# Patient Record
Sex: Female | Born: 1940
Health system: Southern US, Community
[De-identification: ages and names within clinical notes are randomized; demographics above are authoritative.]

## PROBLEM LIST (undated history)

## (undated) DIAGNOSIS — R1011 Right upper quadrant pain: Secondary | ICD-10-CM

## (undated) DIAGNOSIS — E669 Obesity, unspecified: Secondary | ICD-10-CM

## (undated) DIAGNOSIS — I2510 Atherosclerotic heart disease of native coronary artery without angina pectoris: Secondary | ICD-10-CM

## (undated) DIAGNOSIS — J189 Pneumonia, unspecified organism: Secondary | ICD-10-CM

## (undated) DIAGNOSIS — IMO0002 Reserved for concepts with insufficient information to code with codable children: Secondary | ICD-10-CM

## (undated) DIAGNOSIS — I779 Disorder of arteries and arterioles, unspecified: Secondary | ICD-10-CM

## (undated) DIAGNOSIS — Z9889 Other specified postprocedural states: Secondary | ICD-10-CM

## (undated) DIAGNOSIS — I480 Paroxysmal atrial fibrillation: Secondary | ICD-10-CM

## (undated) DIAGNOSIS — I10 Essential (primary) hypertension: Secondary | ICD-10-CM

## (undated) DIAGNOSIS — I4891 Unspecified atrial fibrillation: Secondary | ICD-10-CM

## (undated) DIAGNOSIS — G4730 Sleep apnea, unspecified: Secondary | ICD-10-CM

## (undated) DIAGNOSIS — T4145XA Adverse effect of unspecified anesthetic, initial encounter: Secondary | ICD-10-CM

## (undated) DIAGNOSIS — K219 Gastro-esophageal reflux disease without esophagitis: Secondary | ICD-10-CM

## (undated) DIAGNOSIS — E039 Hypothyroidism, unspecified: Secondary | ICD-10-CM

## (undated) DIAGNOSIS — F32A Depression, unspecified: Secondary | ICD-10-CM

## (undated) DIAGNOSIS — T7840XA Allergy, unspecified, initial encounter: Secondary | ICD-10-CM

## (undated) DIAGNOSIS — M1990 Unspecified osteoarthritis, unspecified site: Secondary | ICD-10-CM

## (undated) DIAGNOSIS — E049 Nontoxic goiter, unspecified: Secondary | ICD-10-CM

## (undated) DIAGNOSIS — R112 Nausea with vomiting, unspecified: Secondary | ICD-10-CM

## (undated) DIAGNOSIS — F419 Anxiety disorder, unspecified: Secondary | ICD-10-CM

## (undated) DIAGNOSIS — R600 Localized edema: Secondary | ICD-10-CM

## (undated) DIAGNOSIS — E785 Hyperlipidemia, unspecified: Secondary | ICD-10-CM

## (undated) DIAGNOSIS — N189 Chronic kidney disease, unspecified: Secondary | ICD-10-CM

## (undated) DIAGNOSIS — R9430 Abnormal result of cardiovascular function study, unspecified: Secondary | ICD-10-CM

## (undated) DIAGNOSIS — I509 Heart failure, unspecified: Secondary | ICD-10-CM

## (undated) DIAGNOSIS — R0600 Dyspnea, unspecified: Secondary | ICD-10-CM

## (undated) DIAGNOSIS — G4733 Obstructive sleep apnea (adult) (pediatric): Secondary | ICD-10-CM

## (undated) DIAGNOSIS — H269 Unspecified cataract: Secondary | ICD-10-CM

## (undated) DIAGNOSIS — R51 Headache: Secondary | ICD-10-CM

## (undated) DIAGNOSIS — J449 Chronic obstructive pulmonary disease, unspecified: Secondary | ICD-10-CM

## (undated) DIAGNOSIS — E119 Type 2 diabetes mellitus without complications: Secondary | ICD-10-CM

## (undated) DIAGNOSIS — I739 Peripheral vascular disease, unspecified: Secondary | ICD-10-CM

## (undated) DIAGNOSIS — K2990 Gastroduodenitis, unspecified, without bleeding: Secondary | ICD-10-CM

## (undated) DIAGNOSIS — R42 Dizziness and giddiness: Secondary | ICD-10-CM

## (undated) DIAGNOSIS — T8859XA Other complications of anesthesia, initial encounter: Secondary | ICD-10-CM

## (undated) DIAGNOSIS — I517 Cardiomegaly: Secondary | ICD-10-CM

## (undated) DIAGNOSIS — K2970 Gastritis, unspecified, without bleeding: Secondary | ICD-10-CM

## (undated) HISTORY — DX: Gastro-esophageal reflux disease without esophagitis: K21.9

## (undated) HISTORY — DX: Headache: R51

## (undated) HISTORY — DX: Essential (primary) hypertension: I10

## (undated) HISTORY — PX: LUMBAR DISC SURGERY: SHX700

## (undated) HISTORY — PX: ABDOMINAL HYSTERECTOMY: SHX81

## (undated) HISTORY — DX: Reserved for concepts with insufficient information to code with codable children: IMO0002

## (undated) HISTORY — DX: Nontoxic goiter, unspecified: E04.9

## (undated) HISTORY — DX: Obesity, unspecified: E66.9

## (undated) HISTORY — DX: Unspecified cataract: H26.9

## (undated) HISTORY — DX: Hypothyroidism, unspecified: E03.9

## (undated) HISTORY — DX: Allergy, unspecified, initial encounter: T78.40XA

## (undated) HISTORY — PX: APPENDECTOMY: SHX54

## (undated) HISTORY — DX: Type 2 diabetes mellitus without complications: E11.9

## (undated) HISTORY — DX: Cardiomegaly: I51.7

## (undated) HISTORY — DX: Hyperlipidemia, unspecified: E78.5

## (undated) HISTORY — DX: Localized edema: R60.0

## (undated) HISTORY — DX: Abnormal result of cardiovascular function study, unspecified: R94.30

## (undated) HISTORY — DX: Chronic obstructive pulmonary disease, unspecified: J44.9

## (undated) HISTORY — DX: Obstructive sleep apnea (adult) (pediatric): G47.33

## (undated) HISTORY — DX: Anxiety disorder, unspecified: F41.9

## (undated) HISTORY — DX: Gastritis, unspecified, without bleeding: K29.70

## (undated) HISTORY — DX: Disorder of arteries and arterioles, unspecified: I77.9

## (undated) HISTORY — PX: JOINT REPLACEMENT: SHX530

## (undated) HISTORY — PX: CATARACT EXTRACTION W/ INTRAOCULAR LENS  IMPLANT, BILATERAL: SHX1307

## (undated) HISTORY — DX: Atherosclerotic heart disease of native coronary artery without angina pectoris: I25.10

## (undated) HISTORY — DX: Right upper quadrant pain: R10.11

## (undated) HISTORY — PX: CARDIAC CATHETERIZATION: SHX172

## (undated) HISTORY — DX: Peripheral vascular disease, unspecified: I73.9

## (undated) HISTORY — PX: BACK SURGERY: SHX140

---
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
## 1997-10-15 ENCOUNTER — Ambulatory Visit (HOSPITAL_COMMUNITY): Admission: RE | Admit: 1997-10-15 | Discharge: 1997-10-15 | Payer: Self-pay | Admitting: Neurosurgery

## 1998-05-01 ENCOUNTER — Encounter: Payer: Self-pay | Admitting: Neurosurgery

## 1998-05-01 ENCOUNTER — Ambulatory Visit (HOSPITAL_COMMUNITY): Admission: RE | Admit: 1998-05-01 | Discharge: 1998-05-01 | Payer: Self-pay | Admitting: Neurosurgery

## 1998-10-04 ENCOUNTER — Ambulatory Visit (HOSPITAL_COMMUNITY): Admission: RE | Admit: 1998-10-04 | Discharge: 1998-10-04 | Payer: Self-pay | Admitting: Neurosurgery

## 1998-10-04 ENCOUNTER — Encounter: Payer: Self-pay | Admitting: Neurosurgery

## 2001-04-19 ENCOUNTER — Ambulatory Visit (HOSPITAL_COMMUNITY): Admission: RE | Admit: 2001-04-19 | Discharge: 2001-04-19 | Payer: Self-pay | Admitting: Neurosurgery

## 2001-04-19 ENCOUNTER — Encounter: Payer: Self-pay | Admitting: Neurosurgery

## 2001-06-14 ENCOUNTER — Encounter: Payer: Self-pay | Admitting: Neurosurgery

## 2001-06-14 ENCOUNTER — Encounter: Admission: RE | Admit: 2001-06-14 | Discharge: 2001-06-14 | Payer: Self-pay | Admitting: Neurosurgery

## 2001-06-29 ENCOUNTER — Encounter: Admission: RE | Admit: 2001-06-29 | Discharge: 2001-06-29 | Payer: Self-pay | Admitting: Neurosurgery

## 2001-06-29 ENCOUNTER — Encounter: Payer: Self-pay | Admitting: Neurosurgery

## 2001-09-06 ENCOUNTER — Ambulatory Visit (HOSPITAL_COMMUNITY): Admission: RE | Admit: 2001-09-06 | Discharge: 2001-09-06 | Payer: Self-pay | Admitting: Neurosurgery

## 2001-09-06 ENCOUNTER — Encounter: Payer: Self-pay | Admitting: Neurosurgery

## 2002-04-23 ENCOUNTER — Encounter: Payer: Self-pay | Admitting: Neurosurgery

## 2002-04-23 ENCOUNTER — Ambulatory Visit (HOSPITAL_COMMUNITY): Admission: RE | Admit: 2002-04-23 | Discharge: 2002-04-23 | Payer: Self-pay | Admitting: Neurosurgery

## 2003-10-29 ENCOUNTER — Encounter: Admission: RE | Admit: 2003-10-29 | Discharge: 2003-10-29 | Payer: Self-pay | Admitting: Family Medicine

## 2004-02-15 ENCOUNTER — Encounter: Admission: RE | Admit: 2004-02-15 | Discharge: 2004-02-15 | Payer: Self-pay | Admitting: Family Medicine

## 2004-09-11 ENCOUNTER — Ambulatory Visit (HOSPITAL_COMMUNITY): Admission: RE | Admit: 2004-09-11 | Discharge: 2004-09-11 | Payer: Self-pay | Admitting: Neurosurgery

## 2004-10-22 ENCOUNTER — Ambulatory Visit (HOSPITAL_COMMUNITY): Admission: RE | Admit: 2004-10-22 | Discharge: 2004-10-22 | Payer: Self-pay | Admitting: Neurosurgery

## 2005-01-08 ENCOUNTER — Ambulatory Visit: Payer: Self-pay | Admitting: Cardiology

## 2005-01-08 ENCOUNTER — Inpatient Hospital Stay (HOSPITAL_COMMUNITY): Admission: EM | Admit: 2005-01-08 | Discharge: 2005-01-12 | Payer: Self-pay | Admitting: Emergency Medicine

## 2005-01-21 ENCOUNTER — Ambulatory Visit: Payer: Self-pay | Admitting: Pulmonary Disease

## 2005-01-28 ENCOUNTER — Ambulatory Visit: Payer: Self-pay

## 2005-01-28 ENCOUNTER — Ambulatory Visit: Payer: Self-pay | Admitting: Physician Assistant

## 2005-02-08 ENCOUNTER — Ambulatory Visit (HOSPITAL_BASED_OUTPATIENT_CLINIC_OR_DEPARTMENT_OTHER): Admission: RE | Admit: 2005-02-08 | Discharge: 2005-02-08 | Payer: Self-pay | Admitting: Pulmonary Disease

## 2005-02-11 ENCOUNTER — Ambulatory Visit: Payer: Self-pay | Admitting: Pulmonary Disease

## 2005-02-12 ENCOUNTER — Ambulatory Visit: Payer: Self-pay | Admitting: Cardiology

## 2005-02-12 ENCOUNTER — Ambulatory Visit: Payer: Self-pay | Admitting: Pulmonary Disease

## 2005-02-18 ENCOUNTER — Ambulatory Visit: Payer: Self-pay | Admitting: Pulmonary Disease

## 2005-03-08 ENCOUNTER — Ambulatory Visit (HOSPITAL_BASED_OUTPATIENT_CLINIC_OR_DEPARTMENT_OTHER): Admission: RE | Admit: 2005-03-08 | Discharge: 2005-03-08 | Payer: Self-pay | Admitting: Pulmonary Disease

## 2005-03-24 ENCOUNTER — Encounter: Admission: RE | Admit: 2005-03-24 | Discharge: 2005-03-24 | Payer: Self-pay | Admitting: Family Medicine

## 2005-03-25 ENCOUNTER — Ambulatory Visit: Payer: Self-pay | Admitting: Pulmonary Disease

## 2005-05-13 ENCOUNTER — Ambulatory Visit: Payer: Self-pay | Admitting: Pulmonary Disease

## 2005-07-01 ENCOUNTER — Ambulatory Visit: Payer: Self-pay | Admitting: Pulmonary Disease

## 2005-07-14 ENCOUNTER — Ambulatory Visit: Payer: Self-pay | Admitting: Pulmonary Disease

## 2005-10-28 ENCOUNTER — Ambulatory Visit: Payer: Self-pay | Admitting: Pulmonary Disease

## 2005-11-18 ENCOUNTER — Ambulatory Visit: Payer: Self-pay | Admitting: Pulmonary Disease

## 2005-12-24 ENCOUNTER — Ambulatory Visit: Payer: Self-pay | Admitting: Emergency Medicine

## 2006-01-11 ENCOUNTER — Ambulatory Visit: Payer: Self-pay | Admitting: Pulmonary Disease

## 2006-03-10 ENCOUNTER — Ambulatory Visit: Payer: Self-pay | Admitting: Pulmonary Disease

## 2006-04-21 ENCOUNTER — Encounter: Admission: RE | Admit: 2006-04-21 | Discharge: 2006-04-21 | Payer: Self-pay | Admitting: Family Medicine

## 2006-06-04 ENCOUNTER — Encounter: Admission: RE | Admit: 2006-06-04 | Discharge: 2006-06-04 | Payer: Self-pay | Admitting: Neurosurgery

## 2006-07-07 ENCOUNTER — Ambulatory Visit: Payer: Self-pay | Admitting: Pulmonary Disease

## 2006-10-05 ENCOUNTER — Ambulatory Visit: Payer: Self-pay | Admitting: Pulmonary Disease

## 2006-10-18 ENCOUNTER — Encounter: Payer: Self-pay | Admitting: Pulmonary Disease

## 2006-10-18 ENCOUNTER — Ambulatory Visit: Payer: Self-pay

## 2006-11-12 ENCOUNTER — Ambulatory Visit: Payer: Self-pay | Admitting: Pulmonary Disease

## 2007-01-07 ENCOUNTER — Encounter: Admission: RE | Admit: 2007-01-07 | Discharge: 2007-01-07 | Payer: Self-pay | Admitting: Gastroenterology

## 2007-02-16 DIAGNOSIS — J4489 Other specified chronic obstructive pulmonary disease: Secondary | ICD-10-CM

## 2007-02-16 DIAGNOSIS — G4733 Obstructive sleep apnea (adult) (pediatric): Secondary | ICD-10-CM | POA: Insufficient documentation

## 2007-02-16 DIAGNOSIS — I152 Hypertension secondary to endocrine disorders: Secondary | ICD-10-CM | POA: Insufficient documentation

## 2007-02-16 DIAGNOSIS — I10 Essential (primary) hypertension: Secondary | ICD-10-CM | POA: Insufficient documentation

## 2007-02-16 DIAGNOSIS — J449 Chronic obstructive pulmonary disease, unspecified: Secondary | ICD-10-CM

## 2007-02-16 HISTORY — DX: Essential (primary) hypertension: I10

## 2007-02-16 HISTORY — DX: Chronic obstructive pulmonary disease, unspecified: J44.9

## 2007-02-16 HISTORY — DX: Obstructive sleep apnea (adult) (pediatric): G47.33

## 2007-02-16 HISTORY — DX: Other specified chronic obstructive pulmonary disease: J44.89

## 2007-03-23 ENCOUNTER — Ambulatory Visit: Payer: Self-pay | Admitting: Pulmonary Disease

## 2007-03-23 DIAGNOSIS — E119 Type 2 diabetes mellitus without complications: Secondary | ICD-10-CM | POA: Insufficient documentation

## 2007-03-23 DIAGNOSIS — M159 Polyosteoarthritis, unspecified: Secondary | ICD-10-CM | POA: Insufficient documentation

## 2007-03-23 DIAGNOSIS — K219 Gastro-esophageal reflux disease without esophagitis: Secondary | ICD-10-CM | POA: Insufficient documentation

## 2007-03-23 DIAGNOSIS — I2789 Other specified pulmonary heart diseases: Secondary | ICD-10-CM | POA: Insufficient documentation

## 2007-03-23 DIAGNOSIS — K5730 Diverticulosis of large intestine without perforation or abscess without bleeding: Secondary | ICD-10-CM

## 2007-03-23 HISTORY — DX: Gastro-esophageal reflux disease without esophagitis: K21.9

## 2007-03-23 HISTORY — DX: Diverticulosis of large intestine without perforation or abscess without bleeding: K57.30

## 2007-03-23 HISTORY — DX: Type 2 diabetes mellitus without complications: E11.9

## 2007-03-24 ENCOUNTER — Ambulatory Visit: Payer: Self-pay | Admitting: Cardiology

## 2007-03-25 ENCOUNTER — Ambulatory Visit: Payer: Self-pay

## 2007-06-04 ENCOUNTER — Encounter: Admission: RE | Admit: 2007-06-04 | Discharge: 2007-06-04 | Payer: Self-pay | Admitting: Neurosurgery

## 2007-06-20 ENCOUNTER — Telehealth (INDEPENDENT_AMBULATORY_CARE_PROVIDER_SITE_OTHER): Payer: Self-pay | Admitting: *Deleted

## 2007-11-08 ENCOUNTER — Ambulatory Visit: Payer: Self-pay | Admitting: Pulmonary Disease

## 2007-12-01 ENCOUNTER — Encounter: Payer: Self-pay | Admitting: Pulmonary Disease

## 2008-01-05 ENCOUNTER — Encounter (HOSPITAL_COMMUNITY): Admission: RE | Admit: 2008-01-05 | Discharge: 2008-01-19 | Payer: Self-pay | Admitting: Pulmonary Disease

## 2008-01-05 ENCOUNTER — Encounter: Payer: Self-pay | Admitting: Pulmonary Disease

## 2008-01-06 ENCOUNTER — Telehealth: Payer: Self-pay | Admitting: Pulmonary Disease

## 2008-01-18 ENCOUNTER — Encounter: Admission: RE | Admit: 2008-01-18 | Discharge: 2008-01-18 | Payer: Self-pay | Admitting: Family Medicine

## 2008-03-14 ENCOUNTER — Encounter: Admission: RE | Admit: 2008-03-14 | Discharge: 2008-03-14 | Payer: Self-pay | Admitting: Family Medicine

## 2008-03-24 ENCOUNTER — Encounter: Admission: RE | Admit: 2008-03-24 | Discharge: 2008-03-24 | Payer: Self-pay | Admitting: General Surgery

## 2008-03-26 ENCOUNTER — Ambulatory Visit: Payer: Self-pay | Admitting: Cardiology

## 2008-03-26 ENCOUNTER — Ambulatory Visit: Payer: Self-pay

## 2008-03-28 ENCOUNTER — Other Ambulatory Visit: Admission: RE | Admit: 2008-03-28 | Discharge: 2008-03-28 | Payer: Self-pay | Admitting: Interventional Radiology

## 2008-03-28 ENCOUNTER — Encounter (INDEPENDENT_AMBULATORY_CARE_PROVIDER_SITE_OTHER): Payer: Self-pay | Admitting: Interventional Radiology

## 2008-03-28 ENCOUNTER — Encounter: Admission: RE | Admit: 2008-03-28 | Discharge: 2008-03-28 | Payer: Self-pay | Admitting: General Surgery

## 2008-04-17 ENCOUNTER — Encounter (INDEPENDENT_AMBULATORY_CARE_PROVIDER_SITE_OTHER): Payer: Self-pay | Admitting: General Surgery

## 2008-04-17 ENCOUNTER — Ambulatory Visit (HOSPITAL_COMMUNITY): Admission: RE | Admit: 2008-04-17 | Discharge: 2008-04-18 | Payer: Self-pay | Admitting: General Surgery

## 2008-04-24 ENCOUNTER — Encounter: Payer: Self-pay | Admitting: Endocrinology

## 2008-05-07 ENCOUNTER — Encounter: Payer: Self-pay | Admitting: Endocrinology

## 2008-05-31 ENCOUNTER — Encounter: Admission: RE | Admit: 2008-05-31 | Discharge: 2008-05-31 | Payer: Self-pay | Admitting: Family Medicine

## 2008-06-04 ENCOUNTER — Ambulatory Visit: Payer: Self-pay | Admitting: Endocrinology

## 2008-06-04 DIAGNOSIS — R51 Headache: Secondary | ICD-10-CM

## 2008-06-04 DIAGNOSIS — E890 Postprocedural hypothyroidism: Secondary | ICD-10-CM | POA: Insufficient documentation

## 2008-06-04 DIAGNOSIS — R519 Headache, unspecified: Secondary | ICD-10-CM | POA: Insufficient documentation

## 2008-06-04 HISTORY — DX: Postprocedural hypothyroidism: E89.0

## 2008-06-04 LAB — CONVERTED CEMR LAB
BUN: 32 mg/dL — ABNORMAL HIGH (ref 6–23)
CO2: 27 meq/L (ref 19–32)
Calcium: 10.2 mg/dL (ref 8.4–10.5)
Chloride: 98 meq/L (ref 96–112)
Creatinine, Ser: 1.4 mg/dL — ABNORMAL HIGH (ref 0.4–1.2)
GFR calc Af Amer: 48 mL/min
GFR calc non Af Amer: 40 mL/min
Glucose, Bld: 78 mg/dL (ref 70–99)
Potassium: 4.3 meq/L (ref 3.5–5.1)
Sed Rate: 31 mm/hr — ABNORMAL HIGH (ref 0–22)
Sodium: 138 meq/L (ref 135–145)
TSH: 48.02 microintl units/mL — ABNORMAL HIGH (ref 0.35–5.50)

## 2008-06-07 LAB — CONVERTED CEMR LAB
Calcium, Total (PTH): 9.9 mg/dL (ref 8.4–10.5)
PTH: 19.5 pg/mL (ref 14.0–72.0)

## 2008-06-20 ENCOUNTER — Ambulatory Visit: Payer: Self-pay | Admitting: Pulmonary Disease

## 2008-10-19 ENCOUNTER — Encounter: Payer: Self-pay | Admitting: Pulmonary Disease

## 2008-10-19 ENCOUNTER — Ambulatory Visit: Payer: Self-pay | Admitting: Pulmonary Disease

## 2008-11-12 ENCOUNTER — Ambulatory Visit: Payer: Self-pay | Admitting: Pulmonary Disease

## 2008-11-12 ENCOUNTER — Ambulatory Visit: Payer: Self-pay | Admitting: Adult Health

## 2008-11-12 LAB — CONVERTED CEMR LAB
BUN: 22 mg/dL (ref 6–23)
CO2: 28 meq/L (ref 19–32)
Calcium: 9.2 mg/dL (ref 8.4–10.5)
Chloride: 105 meq/L (ref 96–112)
Creatinine, Ser: 1 mg/dL (ref 0.4–1.2)
GFR calc non Af Amer: 58.67 mL/min (ref >60)
Glucose, Bld: 132 mg/dL — ABNORMAL HIGH (ref 70–99)
Potassium: 4.1 meq/L (ref 3.5–5.1)
Pro B Natriuretic peptide (BNP): 110 pg/mL — ABNORMAL HIGH (ref 0.0–100.0)
Sodium: 146 meq/L — ABNORMAL HIGH (ref 135–145)

## 2008-11-24 DIAGNOSIS — I517 Cardiomegaly: Secondary | ICD-10-CM | POA: Insufficient documentation

## 2008-11-24 DIAGNOSIS — E669 Obesity, unspecified: Secondary | ICD-10-CM | POA: Insufficient documentation

## 2008-11-24 DIAGNOSIS — E785 Hyperlipidemia, unspecified: Secondary | ICD-10-CM | POA: Insufficient documentation

## 2008-11-24 DIAGNOSIS — E1169 Type 2 diabetes mellitus with other specified complication: Secondary | ICD-10-CM | POA: Insufficient documentation

## 2008-11-24 DIAGNOSIS — I2510 Atherosclerotic heart disease of native coronary artery without angina pectoris: Secondary | ICD-10-CM | POA: Insufficient documentation

## 2008-11-24 HISTORY — DX: Atherosclerotic heart disease of native coronary artery without angina pectoris: I25.10

## 2008-11-24 HISTORY — DX: Cardiomegaly: I51.7

## 2008-11-24 HISTORY — DX: Obesity, unspecified: E66.9

## 2008-11-28 ENCOUNTER — Ambulatory Visit: Payer: Self-pay | Admitting: Cardiology

## 2008-11-28 ENCOUNTER — Encounter: Payer: Self-pay | Admitting: Physician Assistant

## 2008-11-29 ENCOUNTER — Telehealth (INDEPENDENT_AMBULATORY_CARE_PROVIDER_SITE_OTHER): Payer: Self-pay

## 2008-12-03 ENCOUNTER — Ambulatory Visit: Payer: Self-pay

## 2008-12-04 ENCOUNTER — Ambulatory Visit: Payer: Self-pay

## 2008-12-04 ENCOUNTER — Encounter: Payer: Self-pay | Admitting: Cardiovascular Disease

## 2009-01-01 ENCOUNTER — Encounter: Payer: Self-pay | Admitting: Cardiology

## 2009-01-02 ENCOUNTER — Ambulatory Visit: Payer: Self-pay

## 2009-01-02 ENCOUNTER — Ambulatory Visit: Payer: Self-pay | Admitting: Cardiology

## 2009-01-02 ENCOUNTER — Encounter: Payer: Self-pay | Admitting: Cardiology

## 2009-01-08 LAB — CONVERTED CEMR LAB
BUN: 29 mg/dL — ABNORMAL HIGH (ref 6–23)
CO2: 29 meq/L (ref 19–32)
Calcium: 9.6 mg/dL (ref 8.4–10.5)
Chloride: 105 meq/L (ref 96–112)
Creatinine, Ser: 1.2 mg/dL (ref 0.4–1.2)
GFR calc non Af Amer: 47.52 mL/min (ref >60)
Glucose, Bld: 68 mg/dL — ABNORMAL LOW (ref 70–99)
Potassium: 4 meq/L (ref 3.5–5.1)
Sodium: 142 meq/L (ref 135–145)

## 2009-02-07 ENCOUNTER — Encounter: Admission: RE | Admit: 2009-02-07 | Discharge: 2009-02-07 | Payer: Self-pay | Admitting: Neurosurgery

## 2009-02-19 ENCOUNTER — Encounter: Admission: RE | Admit: 2009-02-19 | Discharge: 2009-02-19 | Payer: Self-pay | Admitting: Neurosurgery

## 2009-02-28 ENCOUNTER — Ambulatory Visit: Payer: Self-pay | Admitting: Pulmonary Disease

## 2009-04-02 ENCOUNTER — Ambulatory Visit: Payer: Self-pay | Admitting: Cardiology

## 2009-05-04 HISTORY — PX: THYROIDECTOMY: SHX17

## 2009-05-29 ENCOUNTER — Ambulatory Visit: Payer: Self-pay | Admitting: Pulmonary Disease

## 2009-05-31 ENCOUNTER — Encounter (INDEPENDENT_AMBULATORY_CARE_PROVIDER_SITE_OTHER): Payer: Self-pay | Admitting: *Deleted

## 2009-06-12 ENCOUNTER — Encounter: Payer: Self-pay | Admitting: Pulmonary Disease

## 2009-06-21 ENCOUNTER — Encounter: Payer: Self-pay | Admitting: Pulmonary Disease

## 2009-07-17 ENCOUNTER — Encounter: Payer: Self-pay | Admitting: Cardiology

## 2009-07-18 ENCOUNTER — Ambulatory Visit: Payer: Self-pay | Admitting: Cardiology

## 2009-09-03 ENCOUNTER — Ambulatory Visit: Payer: Self-pay | Admitting: Pulmonary Disease

## 2009-10-03 ENCOUNTER — Encounter: Payer: Self-pay | Admitting: Endocrinology

## 2009-10-03 ENCOUNTER — Encounter: Payer: Self-pay | Admitting: Pulmonary Disease

## 2010-03-19 ENCOUNTER — Encounter: Admission: RE | Admit: 2010-03-19 | Discharge: 2010-03-19 | Payer: Self-pay | Admitting: Family Medicine

## 2010-03-21 ENCOUNTER — Ambulatory Visit: Payer: Self-pay | Admitting: Pulmonary Disease

## 2010-03-25 ENCOUNTER — Encounter: Payer: Self-pay | Admitting: Pulmonary Disease

## 2010-04-08 ENCOUNTER — Encounter: Payer: Self-pay | Admitting: Pulmonary Disease

## 2010-05-25 ENCOUNTER — Encounter: Payer: Self-pay | Admitting: Family Medicine

## 2010-06-05 NOTE — Letter (Signed)
Summary: Appointment - Reminder [place], [place]  [address] [place]   [place],  [place] [address]   Phone: [phone]  Fax: [phone]     May 31, 2009 MRN: [id number]   Kayla [name] [place], [place]  [address]   Dear [name],  Our records indicate that it is time to schedule a follow-up appointment with Dr. Ron Parker. It is very important that we reach you to schedule this appointment. We look forward to participating in your health care needs. Please contact us at the number listed above at your earliest convenience to schedule your appointment.  If you are unable to make an appointment at this time, give [place] a call so we can update our records.  Sincerely,   [facility] Scheduling Team

## 2010-06-05 NOTE — Letter (Signed)
Summary: [facility]  [facility]   Imported By: Bubba Hales 10/11/2009 07:53:28  _____________________________________________________________________  External Attachment:    Type:   Image     Comment:   External Document

## 2010-06-05 NOTE — Miscellaneous (Signed)
  Clinical Lists Changes  Observations: Added new observation of PAST MED HX: EF 55-65%  .. echo 2008  /   60%... echo... September, 2010 Focal basal septal hypertrophy but no LVOT obstruction Lipomatous hypertrophy of the intra-atrial septum CAD (ICD-414.00)- Minimal..cardiac catheter 2006 VENTRICULAR HYPERTROPHY, LEFT (ICD-429.3) HYPERTENSION (ICD-401.9) CAROTID ARTERY DISEASE (ICD-433.10)..Doppler November, 2009.. zero [id number] RICA, [id number] LICA.  Follow up one year HYPERLIPIDEMIA (ICD-272.4) LEG EDEMA (ICD-782.3) HYPOTHYROIDISM, POSTSURGICAL (ICD-244.0) HEADACHE (ICD-784.0) DIABETES, TYPE 2 (ICD-250.00) PULMONARY HYPERTENSION, SECONDARY (ICD-416.8) G E R D (ICD-530.81) DIVERTICULOSIS OF COLON (ICD-562.10) GENERALIZED OSTEOARTHROSIS UNSPECIFIED SITE (ICD-715.00) GOITER, UNSPECIFIED (ICD-240.9) OBESITY (ICD-278.00) COPD      - Spirometry 10/19/08 FEV1 1.55 (68%), FVC 2.21 (70%), FEV1% 70 OSA      - PSG 10/06 with AHI 10      - CPAP 8 cm (07/17/2009 14:44) Added new observation of REFERRING MD: Elissa Lovett (07/17/2009 14:44) Added new observation of PRIMARY MD: Daiva Eves, MD (07/17/2009 14:44)       Past History:  Past Medical History: EF 55-65%  .. echo 2008  /   60%... echo... September, 2010 Focal basal septal hypertrophy but no LVOT obstruction Lipomatous hypertrophy of the intra-atrial septum CAD (ICD-414.00)- Minimal..cardiac catheter 2006 VENTRICULAR HYPERTROPHY, LEFT (ICD-429.3) HYPERTENSION (ICD-401.9) CAROTID ARTERY DISEASE (ICD-433.10)..Doppler November, 2009.. zero [id number] RICA, [id number] LICA.  Follow up one year HYPERLIPIDEMIA (ICD-272.4) LEG EDEMA (ICD-782.3) HYPOTHYROIDISM, POSTSURGICAL (ICD-244.0) HEADACHE (ICD-784.0) DIABETES, TYPE 2 (ICD-250.00) PULMONARY HYPERTENSION, SECONDARY (ICD-416.8) G E R D (ICD-530.81) DIVERTICULOSIS OF COLON (ICD-562.10) GENERALIZED OSTEOARTHROSIS UNSPECIFIED SITE (ICD-715.00) GOITER,  UNSPECIFIED (ICD-240.9) OBESITY (ICD-278.00) COPD      - Spirometry 10/19/08 FEV1 1.55 (68%), FVC 2.21 (70%), FEV1% 70 OSA      - PSG 10/06 with AHI 10      - CPAP 8 cm SH/Risk Factors reviewed for relevance

## 2010-06-05 NOTE — Assessment & Plan Note (Signed)
Summary: SOB///kp   Copy to:  Chesley Mires, Blain Pais Primary Provider/Referring Provider:  Daiva Eves, MD  CC:  COPD.  OSA.  The patient c/o swelling in her hands and feet x3 weeks. She also c/o increased sob when lying down at night and has been sitting up in the recliner to sleep. She is wearing her CPAP every night..  History of Present Illness: 70 year old with known history of  COPD and OSA.  She has been getting more swelling in her hands and ankles.  She had to have her thyroid medication increased recently.  She gets occasional wheeze, but improves with her inhaler.  She uses her proair three times a day.  She has not had any more hoarseness since stopping spiriva.  She gets some sinus congestion, but not too bad.    She continues to have trouble with her sleep.  She is unable to lay flat while sleeping.  She says it feels like her breathing gets cut off if she lays on her back.  She has been getting daily headaches.  She has not had her auto CPAP titration.  She was told by her DME that the auto titration was not necessary, and therefore it was not performed.  Current Medications (verified): 1)  Symbicort 160-4.5 Mcg/act  Aero (Budesonide-Formoterol Fumarate) .... 2 Puffs Two Times A Day 2)  Proair Hfa 108 (90 Base) Mcg/act  Aers (Albuterol Sulfate) .... Inhale 2 Puffs Every Four Hours As Needed 3)  Lantus 100 Unit/ml  Soln (Insulin Glargine) .Marland Kitchen.. 100 Units At Bedtime 4)  Adult Aspirin Low Strength 81 Mg  Tbdp (Aspirin) .Marland Kitchen.. 1 By Mouth Once Daily 5)  Actoplus Met Xr 15-1000 Mg Xr24h-Tab (Pioglitazone Hcl-Metformin Hcl) .Marland Kitchen.. 1 By Mouth Daily 6)  Cpap .... Use At Bedtime 7)  Apidra 100 Unit/ml Soln (Insulin Glulisine) .... Take 10 Units Before Supper As Needed 8)  Simvastatin 80 Mg Tabs (Simvastatin) .... Take 1 At Bedtime 9)  Metoprolol Tartrate 50 Mg Tabs (Metoprolol Tartrate) .... Take 1/2 By Mouth Two Times A Day 10)  Tricor 145 Mg Tabs (Fenofibrate) ....  Take 1 By Mouth Once Daily 11)  Levothyroxine Sodium 125 Mcg Tabs (Levothyroxine Sodium) .... One By Mouth Once Daily 12)  Divalproex Sodium 500 Mg Tbec (Divalproex Sodium) .... 2 Tabs By Mouth At Bedtime For Migraines 13)  Diovan Hct 160-12.5 Mg Tabs (Valsartan-Hydrochlorothiazide) .Marland Kitchen.. 1 By Mouth Daily 14)  Furosemide 40 Mg Tabs (Furosemide) .... Take One Tablet By Mouth Daily.prn  Allergies (verified): 1)  ! Sulfa  Past History:  Past Medical History: Reviewed history from 02/28/2009 and no changes required. EF 55-65%  .. echo 2008 Focal basal septal hypertrophy but no LVOT obstruction Lipomatous hypertrophy of the intra-atrial septum CAD (ICD-414.00)- Minimal..cardiac catheter 2006 VENTRICULAR HYPERTROPHY, LEFT (ICD-429.3) HYPERTENSION (ICD-401.9) CAROTID ARTERY DISEASE (ICD-433.10)..Doppler November, 2009.. zero [id number] RICA, [id number] LICA.  Follow up one year HYPERLIPIDEMIA (ICD-272.4) LEG EDEMA (ICD-782.3) HYPOTHYROIDISM, POSTSURGICAL (ICD-244.0) HEADACHE (ICD-784.0) DIABETES, TYPE 2 (ICD-250.00) PULMONARY HYPERTENSION, SECONDARY (ICD-416.8) G E R D (ICD-530.81) DIVERTICULOSIS OF COLON (ICD-562.10) GENERALIZED OSTEOARTHROSIS UNSPECIFIED SITE (ICD-715.00) GOITER, UNSPECIFIED (ICD-240.9) OBESITY (ICD-278.00) COPD      - Spirometry 10/19/08 FEV1 1.55 (68%), FVC 2.21 (70%), FEV1% 70 OSA      - PSG 10/06 with AHI 10      - CPAP 8 cm  Past Surgical History: Thyroidectomy 12/09 Back surgery  Vital Signs:  Patient profile:   70 year old female Height:  66 inches (167.64 cm) Weight:      268 pounds (121.82 kg) BMI:     43.41 O2 Sat:      96 % on Room air Temp:     97.3 degrees F (36.28 degrees C) oral Pulse rate:   74 / minute BP sitting:   140 / 62  (left arm) Cuff size:   large  Vitals Entered By: Francesca Jewett CMA (May 29, 2009 3:16 PM)  O2 Sat at Rest %:  96 O2 Flow:  Room air  Physical Exam  General:  obese.   Nose:  No sinus tenderness Mouth:  no oral  lesion Neck:  healed scar is present.  no JVD.   Lungs:  decreased breath sounds, no wheezing or rales. Heart:  regular rate and rhythm, S1, S2 without murmurs, rubs, gallops, or clicks Abdomen:  soft, nontender Extremities:  mild non-pitting edema Cervical Nodes:  no significant adenopathy   Impression & Recommendations:  Problem # 1:  COPD (ICD-496) This seems stable.  She is to continue on symbicort and as needed proair.  Again she has been intolerant of spiriva.  Problem # 2:  OBSTRUCTIVE SLEEP APNEA (ICD-327.23) She did not have her auto titration done, apparently due to some confusion with the DME.  I am going to switch her to a new DME, and re-order her auto titration.  I have explained that with her weight gain, aging, and thyroid disease her sleep apnea could have gotten worse.  If she is having sleep disruption from worsening sleep apnea, this could be contributing to her headaches.  If her auto titration is unrevealing, then will need to consider in lab titration study.  Problem # 3:  EDEMA (ICD-782.3) She c/o hand and leg swelling.  I am concerned this may be related to her thyroid disease.  I have advised her to f/u with her PCP for further evaluation of her thyroid function.  Medications Added to Medication List This Visit: 1)  Diovan Hct 160-12.5 Mg Tabs (Valsartan-hydrochlorothiazide) .Marland Kitchen.. 1 by mouth daily 2)  Furosemide 40 Mg Tabs (Furosemide) .... Take one tablet by mouth daily.prn  Complete Medication List: 1)  Symbicort 160-4.5 Mcg/act Aero (Budesonide-formoterol fumarate) .... 2 puffs two times a day 2)  Proair Hfa 108 (90 Base) Mcg/act Aers (Albuterol sulfate) .... Inhale 2 puffs every four hours as needed 3)  Lantus 100 Unit/ml Soln (Insulin glargine) .Marland Kitchen.. 100 units at bedtime 4)  Adult Aspirin Low Strength 81 Mg Tbdp (Aspirin) .Marland Kitchen.. 1 by mouth once daily 5)  Actoplus Met Xr 15-1000 Mg Xr24h-tab (Pioglitazone hcl-metformin hcl) .Marland Kitchen.. 1 by mouth daily 6)  Cpap  ....  Use at bedtime 7)  Apidra 100 Unit/ml Soln (Insulin glulisine) .... Take 10 units before supper as needed 8)  Simvastatin 80 Mg Tabs (Simvastatin) .... Take 1 at bedtime 9)  Metoprolol Tartrate 50 Mg Tabs (Metoprolol tartrate) .... Take 1/2 by mouth two times a day 10)  Tricor 145 Mg Tabs (Fenofibrate) .... Take 1 by mouth once daily 11)  Levothyroxine Sodium 125 Mcg Tabs (Levothyroxine sodium) .... One by mouth once daily 12)  Divalproex Sodium 500 Mg Tbec (Divalproex sodium) .... 2 tabs by mouth at bedtime for migraines 13)  Diovan Hct 160-12.5 Mg Tabs (Valsartan-hydrochlorothiazide) .Marland Kitchen.. 1 by mouth daily 14)  Furosemide 40 Mg Tabs (Furosemide) .... Take one tablet by mouth daily.prn  Other Orders: Est. Patient Level III [id number]) DME Referral (DME)  Patient Instructions: 1)  Will arrange for CPAP test  at home 2)  Follow up in 4 months   Immunization History:  Influenza Immunization History:    Influenza:  historical (03/18/2009)

## 2010-06-05 NOTE — Assessment & Plan Note (Signed)
Summary: 6 month rov/sl  Medications Added ACTOPLUS MET XR 15-1000 MG XR24H-TAB (PIOGLITAZONE HCL-METFORMIN HCL) 1 by mouth two times a day LEVOTHYROXINE SODIUM 175 MCG TABS (LEVOTHYROXINE SODIUM) Take 1 tablet by mouth once a day      Allergies Added:   Visit Type:  Follow-up Referring Provider:  Chesley Mires, Blain Pais Primary Provider:  Daiva Eves, MD  CC:  shortness of breath.  History of Present Illness: Patient is seen for followup of shortness of breath.  She has seen Dr.Sood lately for pulmonary followup.  He is adjusted and her CPAP and she is doing much better.  She feels better and her migraines are decreased.  Her cardiac status is stable.  I reviewed with her the fact that she had a nuclear exercise test in August, 2010.  There was no ischemia.  She had an echo in September, 2010.  LV function was normal.  She's not having chest pain.  She's feeling well in general.  Current Medications (verified): 1)  Symbicort 160-4.5 Mcg/act  Aero (Budesonide-Formoterol Fumarate) .... 2 Puffs Two Times A Day 2)  Proair Hfa 108 (90 Base) Mcg/act  Aers (Albuterol Sulfate) .... Inhale 2 Puffs Every Four Hours As Needed 3)  Lantus 100 Unit/ml  Soln (Insulin Glargine) .Marland Kitchen.. 100 Units At Bedtime 4)  Adult Aspirin Low Strength 81 Mg  Tbdp (Aspirin) .Marland Kitchen.. 1 By Mouth Once Daily 5)  Actoplus Met Xr 15-1000 Mg Xr24h-Tab (Pioglitazone Hcl-Metformin Hcl) .Marland Kitchen.. 1 By Mouth Two Times A Day 6)  Cpap .... Use At Bedtime 7)  Simvastatin 80 Mg Tabs (Simvastatin) .... Take 1 At Bedtime 8)  Metoprolol Tartrate 50 Mg Tabs (Metoprolol Tartrate) .... Take 1/2 By Mouth Two Times A Day 9)  Tricor 145 Mg Tabs (Fenofibrate) .... Take 1 By Mouth Once Daily 10)  Levothyroxine Sodium 175 Mcg Tabs (Levothyroxine Sodium) .... Take 1 Tablet By Mouth Once A Day 11)  Divalproex Sodium 500 Mg Tbec (Divalproex Sodium) .... 2 Tabs By Mouth At Bedtime For Migraines 12)  Diovan Hct 160-12.5 Mg Tabs  (Valsartan-Hydrochlorothiazide) .Marland Kitchen.. 1 By Mouth Daily 13)  Furosemide 40 Mg Tabs (Furosemide) .... Take One Tablet By Mouth Daily.prn  Allergies (verified): 1)  ! Sulfa  Past History:  Past Medical History: EF 55-65%  .. echo 2008  /   60%... echo... September, 2010 Focal basal septal hypertrophy but no LVOT obstruction Lipomatous hypertrophy of the intra-atrial septum CAD (ICD-414.00)- Minimal..cardiac catheter 2006  /   dobutamine nuclear study August,2010  no ischemia or scar. VENTRICULAR HYPERTROPHY, LEFT (ICD-429.3) HYPERTENSION (ICD-401.9) CAROTID ARTERY DISEASE (ICD-433.10)..Doppler November, 2009.. zero [id number] RICA, 123456 LICA. /  Doppler November, 2010  [id number] RICA, [id number] LICA  stable HYPERLIPIDEMIA (ICD-272.4) LEG EDEMA (ICD-782.3) HYPOTHYROIDISM, POSTSURGICAL (ICD-244.0) HEADACHE (ICD-784.0) DIABETES, TYPE 2 (ICD-250.00) PULMONARY HYPERTENSION, SECONDARY (ICD-416.8) G E R D (ICD-530.81) DIVERTICULOSIS OF COLON (ICD-562.10) GENERALIZED OSTEOARTHROSIS UNSPECIFIED SITE (ICD-715.00) GOITER, UNSPECIFIED (ICD-240.9) OBESITY (ICD-278.00) COPD      - Spirometry 10/19/08 FEV1 1.55 (68%), FVC 2.21 (70%), FEV1% 70 OSA      - PSG 10/06 with AHI 10      - CPAP 8 cm  Review of Systems       Patient denies fever, chills, headache, sweats, rash, change in vision, change in hearing, chest pain, cough, shortness of breath, nausea or vomiting, urinary symptoms.  All other systems are reviewed and are negative.  Vital Signs:  Patient profile:   70 year old female Height:  66 inches Weight:      260 pounds BMI:     42.12 Pulse rate:   80 / minute BP sitting:   116 / 58  (left arm) Cuff size:   regular  Vitals Entered By: Mignon Pine, RMA (July 18, 2009 8:57 AM)  Physical Exam  General:  patient is stable today. Eyes:  no xanthelasma. Neck:  no jugular venous distention. Lungs:  lungs are clear.  Respiratory effort is nonlabored. Heart:  cardiac exam reveals an  S1-S2.  There no clicks or significant murmurs. Abdomen:  abdomen is soft. Extremities:  no peripheral edema. Psych:  patient is oriented to person time and place.  Affect is normal.   Impression & Recommendations:  Problem # 1:  EDEMA (ICD-782.3) There is no significant edema.  No further workup.  Problem # 2:  HYPERTENSION (ICD-401.9) blood pressure is stable.  No further workup  Problem # 3:  SHORTNESS OF BREATH (ICD-786.05) With her followup pulmonary treatment her breathing is much better.  No further cardiac workup.  Problem # 4:  CHEST PAIN, HX OF (ICD-V12.50) she has not had any significant chest pain.  No further workup.  Problem # 5:  CAD (ICD-414.00)  Her updated medication list for this problem includes:    Adult Aspirin Low Strength 81 Mg Tbdp (Aspirin) .Marland Kitchen... 1 by mouth once daily    Metoprolol Tartrate 50 Mg Tabs (Metoprolol tartrate) .Marland Kitchen... Take 1/2 by mouth two times a day Historically there was minimal disease at catheterization in the past.  Recent studies show no sign of ischemia.  No further workup is needed at this time.  Problem # 6:  CAROTID ARTERY DISEASE (ICD-433.10)  Her updated medication list for this problem includes:    Adult Aspirin Low Strength 81 Mg Tbdp (Aspirin) .Marland Kitchen... 1 by mouth once daily Carotid disease is stable.  Dopplers are stable.

## 2010-06-05 NOTE — Letter (Signed)
Summary: Order of CPAP Equipment / [facility] / [employer]   Imported By: Rise Patience 04/03/2010 16:45:17  _____________________________________________________________________  External Attachment:    Type:   Image     Comment:   External Document

## 2010-06-05 NOTE — Miscellaneous (Signed)
Summary: auto-CPAP 05/31/09 through 06/12/09   Clinical Lists Changes Optimal pressure 12 cm with average AHI 1.3.  Used on 13 of 13 days with average use 8hrs 47 min.  Will change pressure to 12 cm, and monitor response.  Left message on pt's answering machine about plan. Orders: Added new Referral order of DME Referral (DME) - Signed

## 2010-06-05 NOTE — Assessment & Plan Note (Signed)
Summary: per pt call/pt due/mhh   Copy to:  Chesley Mires, Blain Pais Primary Provider/Referring Provider:  Daiva Eves, MD  CC:  OSA and COPD follow up. Pt states no new complaints, is using CPAP every night, and breathing is the same.  History of Present Illness: 70 year old with known history of  COPD and OSA on CPAP 12 cm.  She has been sleeping better since her CPAP pressure was adjusted.  She has more energy during the day.  She is not having any trouble with her CPAP mask.  She denies sinus problems or mouth dryness.  Her breathing has been doing okay.  She uses Proair 5 to 6 times per week.  She does not have much cough, wheeze, sputum.  She denies fever, chest pain, or hemoptysis.  She is not having leg swelling.  Preventive Screening-Counseling & Management  Alcohol-Tobacco     Smoking Status: quit  Caffeine-Diet-Exercise     Exercise (avg: min/session): 11:16  Current Medications (verified): 1)  Symbicort 160-4.5 Mcg/act  Aero (Budesonide-Formoterol Fumarate) .... Inhale 2 Puffs Two Times A Day 2)  Proair Hfa 108 (90 Base) Mcg/act  Aers (Albuterol Sulfate) .... Inhale 2 Puffs Every Four Hours As Needed 3)  Lantus 100 Unit/ml  Soln (Insulin Glargine) .... 75 Units At Bedtime 4)  Adult Aspirin Low Strength 81 Mg  Tbdp (Aspirin) .Marland Kitchen.. 1 By Mouth Once Daily 5)  Actoplus Met Xr 15-1000 Mg Xr24h-Tab (Pioglitazone Hcl-Metformin Hcl) .Marland Kitchen.. 1 By Mouth Two Times A Day 6)  Cpap .... Use At Bedtime [employer], 12 7)  Simvastatin 80 Mg Tabs (Simvastatin) .... Take 1 At Bedtime 8)  Metoprolol Tartrate 50 Mg Tabs (Metoprolol Tartrate) .... Take 1/2 By Mouth Two Times A Day 9)  Tricor 145 Mg Tabs (Fenofibrate) .... Take 1 By Mouth Once Daily 10)  Levothyroxine Sodium 175 Mcg Tabs (Levothyroxine Sodium) .... Take One Tablet By Mouth Every Other Day 11)  Divalproex Sodium 500 Mg Tbec (Divalproex Sodium) .Marland Kitchen.. 1 Tabs By Mouth At Bedtime For Migraines 12)  Diovan Hct 160-12.5 Mg  Tabs (Valsartan-Hydrochlorothiazide) .Marland Kitchen.. 1 By Mouth Daily 13)  Furosemide 40 Mg Tabs (Furosemide) .... Take One Tablet By Mouth Daily.prn 14)  Levothyroxine Sodium 200 Mcg Tabs (Levothyroxine Sodium) .... Take One Tablet By Mouth Every Other Day Alternating With 129mcg  Allergies (verified): 1)  ! Sulfa  Past History:  Past Medical History: Reviewed history from 09/03/2009 and no changes required. EF 55-65%  .. echo 2008  /   60%... echo... September, 2010 Focal basal septal hypertrophy but no LVOT obstruction Lipomatous hypertrophy of the intra-atrial septum CAD (ICD-414.00)- Minimal..cardiac catheter 2006  /   dobutamine nuclear study August,2010  no ischemia or scar. VENTRICULAR HYPERTROPHY, LEFT (ICD-429.3) HYPERTENSION (ICD-401.9) CAROTID ARTERY DISEASE (ICD-433.10)..Doppler November, 2009.. zero [id number] RICA, 123456 LICA. /  Doppler November, 2010  [id number] RICA, 123456 LICA  stable HYPERLIPIDEMIA (ICD-272.4) LEG EDEMA (ICD-782.3) HYPOTHYROIDISM, POSTSURGICAL (ICD-244.0) HEADACHE (ICD-784.0) DIABETES, TYPE 2 (ICD-250.00) PULMONARY HYPERTENSION, SECONDARY (ICD-416.8) G E R D (ICD-530.81) DIVERTICULOSIS OF COLON (ICD-562.10) GENERALIZED OSTEOARTHROSIS UNSPECIFIED SITE (ICD-715.00) GOITER, UNSPECIFIED (ICD-240.9) OBESITY (ICD-278.00) COPD      - Spirometry 10/19/08 FEV1 1.55 (68%), FVC 2.21 (70%), FEV1% 70      - Intolerant of Spiriva OSA      - PSG 10/06 with AHI 10      - CPAP 12 cm  Past Surgical History: Reviewed history from 05/29/2009 and no changes required. Thyroidectomy 12/09 Back surgery  Vital Signs:  Patient profile:   70 year old female Height:      66 inches Weight:      273.6 pounds BMI:     44.32 O2 Sat:      97 % on Room air Temp:     97.5 degrees F oral Pulse rate:   70 / minute BP sitting:   130 / 80  (right arm) Cuff size:   large  Vitals Entered By: Iran Planas CMA (March 21, 2010 3:45 PM)  O2 Flow:  Room air CC: OSA and COPD follow  up. Pt states no new complaints, is using CPAP every night, breathing is the same Comments Medications reviewed with patient Verified contact number and pharmacy with patient Iran Planas CMA  March 21, 2010 3:46 PM    Physical Exam  General:  obese.   Nose:  No sinus tenderness Mouth:  no oral lesion Neck:  healed scar is present.  no JVD.   Lungs:  decreased breath sounds, no wheezing or rales. Heart:  regular rate and rhythm, S1, S2 without murmurs, rubs, gallops, or clicks Extremities:  mild non-pitting edema Neurologic:  normal CN II-XII and strength normal.   Cervical Nodes:  no significant adenopathy Psych:  alert and cooperative; normal mood and affect; normal attention span and concentration   Impression & Recommendations:  Problem # 1:  COPD (ICD-496) She is stable on her current inhaler regimen.  Problem # 2:  OBSTRUCTIVE SLEEP APNEA (ICD-327.23) She has done better since her CPAP setting was adjusted to 12 cm H2O.  Medications Added to Medication List This Visit: 1)  Symbicort 160-4.5 Mcg/act Aero (Budesonide-formoterol fumarate) .... Inhale 2 puffs two times a day 2)  Lantus 100 Unit/ml Soln (Insulin glargine) .... 75 units at bedtime 3)  Cpap  .... Use at bedtime [employer], 12 4)  Levothyroxine Sodium 175 Mcg Tabs (Levothyroxine sodium) .... Take one tablet by mouth every other day 5)  Divalproex Sodium 500 Mg Tbec (Divalproex sodium) .Marland Kitchen.. 1 tabs by mouth at bedtime for migraines 6)  Levothyroxine Sodium 200 Mcg Tabs (Levothyroxine sodium) .... Take one tablet by mouth every other day alternating with 157mcg  Complete Medication List: 1)  Symbicort 160-4.5 Mcg/act Aero (Budesonide-formoterol fumarate) .... Inhale 2 puffs two times a day 2)  Proair Hfa 108 (90 Base) Mcg/act Aers (Albuterol sulfate) .... Inhale 2 puffs every four hours as needed 3)  Lantus 100 Unit/ml Soln (Insulin glargine) .... 75 units at bedtime 4)  Adult Aspirin Low Strength 81 Mg Tbdp  (Aspirin) .Marland Kitchen.. 1 by mouth once daily 5)  Actoplus Met Xr 15-1000 Mg Xr24h-tab (Pioglitazone hcl-metformin hcl) .Marland Kitchen.. 1 by mouth two times a day 6)  Cpap  .... Use at bedtime [employer], 12 7)  Simvastatin 80 Mg Tabs (Simvastatin) .... Take 1 at bedtime 8)  Metoprolol Tartrate 50 Mg Tabs (Metoprolol tartrate) .... Take 1/2 by mouth two times a day 9)  Tricor 145 Mg Tabs (Fenofibrate) .... Take 1 by mouth once daily 10)  Levothyroxine Sodium 175 Mcg Tabs (Levothyroxine sodium) .... Take one tablet by mouth every other day 11)  Divalproex Sodium 500 Mg Tbec (Divalproex sodium) .Marland Kitchen.. 1 tabs by mouth at bedtime for migraines 12)  Diovan Hct 160-12.5 Mg Tabs (Valsartan-hydrochlorothiazide) .Marland Kitchen.. 1 by mouth daily 13)  Furosemide 40 Mg Tabs (Furosemide) .... Take one tablet by mouth daily.prn 14)  Levothyroxine Sodium 200 Mcg Tabs (Levothyroxine sodium) .... Take one tablet by mouth every other day alternating with 125mcg  Other Orders:  Est. Patient Level III [id number])  Patient Instructions: 1)  Follow up in 6 months   Immunization History:  Influenza Immunization History:    Influenza:  historical (02/03/2010)  Pneumovax Immunization History:    Pneumovax:  historical (02/03/2010)

## 2010-06-05 NOTE — Letter (Signed)
Summary: [facility]  [facility]   Imported By: Phillis Knack 10/09/2009 12:00:49  _____________________________________________________________________  External Attachment:    Type:   Image     Comment:   External Document

## 2010-06-05 NOTE — Assessment & Plan Note (Signed)
Summary: 4 months/apc   Copy to:  Kellee Sittner, Blain Pais Primary Provider/Referring Provider:  Daiva Eves, MD  CC:  4 mos ROV and Air on cpap machine did well when changed from 8-12 now pt states she is having migraine ha.  History of Present Illness: 70 year old with known history of  COPD and OSA on CPAP 12 cm.  She has been sleeping better since her CPAP pressure was increased.  Her headaches also initially improved.  She however has started getting headaches again.  Her breathing has been doing well.  She occasionally uses her albuterol.  She is not having much wheeze, cough, or congestion.  She denies leg swelling.    Current Medications (verified): 1)  Symbicort 160-4.5 Mcg/act  Aero (Budesonide-Formoterol Fumarate) .... 2 Puffs Two Times A Day 2)  Proair Hfa 108 (90 Base) Mcg/act  Aers (Albuterol Sulfate) .... Inhale 2 Puffs Every Four Hours As Needed 3)  Lantus 100 Unit/ml  Soln (Insulin Glargine) .Marland Kitchen.. 100 Units At Bedtime 4)  Adult Aspirin Low Strength 81 Mg  Tbdp (Aspirin) .Marland Kitchen.. 1 By Mouth Once Daily 5)  Actoplus Met Xr 15-1000 Mg Xr24h-Tab (Pioglitazone Hcl-Metformin Hcl) .Marland Kitchen.. 1 By Mouth Two Times A Day 6)  Cpap .... Use At Bedtime 7)  Simvastatin 80 Mg Tabs (Simvastatin) .... Take 1 At Bedtime 8)  Metoprolol Tartrate 50 Mg Tabs (Metoprolol Tartrate) .... Take 1/2 By Mouth Two Times A Day 9)  Tricor 145 Mg Tabs (Fenofibrate) .... Take 1 By Mouth Once Daily 10)  Levothyroxine Sodium 175 Mcg Tabs (Levothyroxine Sodium) .... Take 1 Tablet By Mouth Once A Day 11)  Divalproex Sodium 500 Mg Tbec (Divalproex Sodium) .... 2 Tabs By Mouth At Bedtime For Migraines 12)  Diovan Hct 160-12.5 Mg Tabs (Valsartan-Hydrochlorothiazide) .Marland Kitchen.. 1 By Mouth Daily 13)  Furosemide 40 Mg Tabs (Furosemide) .... Take One Tablet By Mouth Daily.prn  Allergies: 1)  ! Sulfa  Past History:  Past Medical History: EF 55-65%  .. echo 2008  /   60%... echo... September, 2010 Focal  basal septal hypertrophy but no LVOT obstruction Lipomatous hypertrophy of the intra-atrial septum CAD (ICD-414.00)- Minimal..cardiac catheter 2006  /   dobutamine nuclear study August,2010  no ischemia or scar. VENTRICULAR HYPERTROPHY, LEFT (ICD-429.3) HYPERTENSION (ICD-401.9) CAROTID ARTERY DISEASE (ICD-433.10)..Doppler November, 2009.. zero [id number] RICA, [id number] LICA. /  Doppler November, 2010  [id number] RICA, [id number] LICA  stable HYPERLIPIDEMIA (ICD-272.4) LEG EDEMA (ICD-782.3) HYPOTHYROIDISM, POSTSURGICAL (ICD-244.0) HEADACHE (ICD-784.0) DIABETES, TYPE 2 (ICD-250.00) PULMONARY HYPERTENSION, SECONDARY (ICD-416.8) G E R D (ICD-530.81) DIVERTICULOSIS OF COLON (ICD-562.10) GENERALIZED OSTEOARTHROSIS UNSPECIFIED SITE (ICD-715.00) GOITER, UNSPECIFIED (ICD-240.9) OBESITY (ICD-278.00) COPD      - Spirometry 10/19/08 FEV1 1.55 (68%), FVC 2.21 (70%), FEV1% 70      - Intolerant of Spiriva OSA      - PSG 10/06 with AHI 10      - CPAP 12 cm  Past Surgical History: Reviewed history from 05/29/2009 and no changes required. Thyroidectomy 12/09 Back surgery  Vital Signs:  Patient profile:   70 year old female Height:      66 inches Weight:      262.2 pounds O2 Sat:      95 % on Room air Temp:     97.9 degrees F oral Pulse rate:   77 / minute BP sitting:   132 / 72  (right arm) Cuff size:   large  Vitals Entered By: Lyndee Leo, CMA (Sep 03, 2009 2:17  PM)  O2 Sat at Rest %:  95 O2 Flow:  Room air   Physical Exam  General:  obese.   Nose:  No sinus tenderness Mouth:  no oral lesion Neck:  healed scar is present.  no JVD.   Lungs:  decreased breath sounds, no wheezing or rales. Heart:  regular rate and rhythm, S1, S2 without murmurs, rubs, gallops, or clicks Extremities:  mild non-pitting edema Cervical Nodes:  no significant adenopathy   Impression & Recommendations:  Problem # 1:  COPD (ICD-496) Her breathing is doing okay.  Will decrease her symbicort to one puff two  times a day, and see if she tolerates this.  Problem # 2:  OBSTRUCTIVE SLEEP APNEA (ICD-327.23)  She has been sleeping better since her CPAP pressure was increased to 12 cm.  Medications Added to Medication List This Visit: 1)  Symbicort 160-4.5 Mcg/act Aero (Budesonide-formoterol fumarate) .... One puff two times a day  Complete Medication List: 1)  Symbicort 160-4.5 Mcg/act Aero (Budesonide-formoterol fumarate) .... One puff two times a day 2)  Proair Hfa 108 (90 Base) Mcg/act Aers (Albuterol sulfate) .... Inhale 2 puffs every four hours as needed 3)  Lantus 100 Unit/ml Soln (Insulin glargine) .Marland Kitchen.. 100 units at bedtime 4)  Adult Aspirin Low Strength 81 Mg Tbdp (Aspirin) .Marland Kitchen.. 1 by mouth once daily 5)  Actoplus Met Xr 15-1000 Mg Xr24h-tab (Pioglitazone hcl-metformin hcl) .Marland Kitchen.. 1 by mouth two times a day 6)  Cpap  .... Use at bedtime 7)  Simvastatin 80 Mg Tabs (Simvastatin) .... Take 1 at bedtime 8)  Metoprolol Tartrate 50 Mg Tabs (Metoprolol tartrate) .... Take 1/2 by mouth two times a day 9)  Tricor 145 Mg Tabs (Fenofibrate) .... Take 1 by mouth once daily 10)  Levothyroxine Sodium 175 Mcg Tabs (Levothyroxine sodium) .... Take 1 tablet by mouth once a day 11)  Divalproex Sodium 500 Mg Tbec (Divalproex sodium) .... 2 tabs by mouth at bedtime for migraines 12)  Diovan Hct 160-12.5 Mg Tabs (Valsartan-hydrochlorothiazide) .Marland Kitchen.. 1 by mouth daily 13)  Furosemide 40 Mg Tabs (Furosemide) .... Take one tablet by mouth daily.prn  Other Orders: Est. Patient Level III [id number])  Patient Instructions: 1)  Change symbicort to one puff two times a day 2)  Continue proair two puffs up to four times per day as needed  3)  Follow up in 4 months  Prevention & Chronic Care Immunizations   Influenza vaccine: Historical  (03/18/2009)    Tetanus booster: Not documented    Pneumococcal vaccine: Not documented    H. zoster vaccine: Not documented  Colorectal Screening   Hemoccult: Not documented     Colonoscopy: Not documented  Other Screening   Pap smear: Not documented    Mammogram: Not documented    DXA bone density scan: Not documented   Smoking status: quit  (03/23/2007)  Diabetes Mellitus   HgbA1C: Not documented    Eye exam: Not documented    Foot exam: Not documented   High risk foot: Not documented   Foot care education: Not documented    Urine microalbumin/creatinine ratio: Not documented  Lipids   Total Cholesterol: Not documented   LDL: Not documented   LDL Direct: Not documented   HDL: Not documented   Triglycerides: Not documented    SGOT (AST): Not documented   SGPT (ALT): Not documented   Alkaline phosphatase: Not documented   Total bilirubin: Not documented  Hypertension   Last Blood Pressure: 132 / 72  (09/03/2009)  Serum creatinine: 1.2  (01/02/2009)   Serum potassium 4.0  (01/02/2009)  Self-Management Support :    Diabetes self-management support: Not documented    Hypertension self-management support: Not documented    Lipid self-management support: Not documented

## 2010-06-05 NOTE — Letter (Signed)
Summary: [facility]   Imported By: Bubba Hales 04/14/2010 09:04:33  _____________________________________________________________________  External Attachment:    Type:   Image     Comment:   External Document

## 2010-06-20 ENCOUNTER — Telehealth (INDEPENDENT_AMBULATORY_CARE_PROVIDER_SITE_OTHER): Payer: Self-pay | Admitting: *Deleted

## 2010-07-01 NOTE — Progress Notes (Addendum)
Summary: waiting on doc signature on prescription -  Phone Note From Pharmacy   Caller: april with [facility] Call For: sood  Summary of Call: april with [facility] phoned to follow up on a the doctor to sign the prescription for her nebulizer solution. April can be reached [phone] Initial call taken by: Ozella Rocks,  June 20, 2010 3:00 PM  Follow-up for Phone Call        ATC April @ [facility].  line rang multiple times.  NA, no option to LM. Parke Poisson CNA/MA  June 20, 2010 5:29 PM    called and lmom for debbie to refax the rx for the nebulizer solution Elita Boone CMA  June 20, 2010 5:38 PM   [facility] TCB for Debbie at ext 3. Parke Poisson CNA/MA  June 23, 2010 5:23 PM   Additional Follow-up for Phone Call Additional follow up Details #1::        Spoke with Jackelyn Poling and advised fax was not recieved.  I advised needs to refax this to the triage faxline and we will address it when it is recieved.  Additional Follow-up by: Tilden Dome,  June 24, 2010 11:30 AM     Appended Document: waiting on doc signature on prescription - Received fax request from [facility].  However, have not received request from patient stating she would agree to these changes.  Will have my nurse contact patient to verify whether she would want to switch from inhaled symbicort to nebulized brovana and budesonide.  Will not sign script until pt gives confirmation.  Appended Document: waiting on doc signature on prescription - Called and spoke with pt and she states that she did not agree to those changes. Pt states she informed [facility] she has been doing well on her inhaled medications and would only change to the nebulized medication if Dr. Halford Chessman thought those medications would benefit her more. Pt states if Dr. Halford Chessman wants her to change to those medications then she agrees to change but if not then she just wants to continue her current meds the same. Pt has an apt on  07/21/10  to discuss how things are going  Appended Document: waiting on doc signature on prescription - Please inform pt that there is no reason to change her medications at this time.  Will discuss further at her next visit.  Appended Document: waiting on doc signature on prescription - called and spoke with pt and she verbalized understanding

## 2010-07-02 ENCOUNTER — Telehealth (INDEPENDENT_AMBULATORY_CARE_PROVIDER_SITE_OTHER): Payer: Self-pay | Admitting: *Deleted

## 2010-07-10 NOTE — Progress Notes (Signed)
Summary: pharmacy calling / neb  Phone Note From Pharmacy   Caller: debbie w/ [facility] Call For: sood  Summary of Call: re: nebulizor rx. call debbie at [phone] x [phone].  Initial call taken by: Cooper Render, CNA,  July 02, 2010 10:43 AM  Follow-up for Phone Call        Spoke with Kayla [name].  She states that she is waiting to hear back from [facility] regarding substitute for symbicort.  She states that they were trying to get her switched to brovana and budesonide and wanted to know if this would be okay.  Pls advise thanks. Follow-up by: Tilden Dome,  July 02, 2010 1:39 PM  Additional Follow-up for Phone Call Additional follow up Details #1::        We had discussed with patient.  Kayla [name] never requested this change.  I see no indication for this change.  This request is therefore not approved by me.  Please notify the pharmacy of this decision. Additional Follow-up by: Chesley Mires MD,  July 02, 2010 2:24 PM    Additional Follow-up for Phone Call Additional follow up Details #2::    Spoke with Kayla [name] and notified of the above recs per [facility] and she verbalized understanding. Follow-up by: Tilden Dome,  July 02, 2010 2:30 PM

## 2010-07-21 ENCOUNTER — Encounter: Payer: Self-pay | Admitting: Pulmonary Disease

## 2010-07-21 ENCOUNTER — Ambulatory Visit (INDEPENDENT_AMBULATORY_CARE_PROVIDER_SITE_OTHER): Payer: Medicare Other | Admitting: Pulmonary Disease

## 2010-07-21 DIAGNOSIS — J310 Chronic rhinitis: Secondary | ICD-10-CM

## 2010-07-21 DIAGNOSIS — J449 Chronic obstructive pulmonary disease, unspecified: Secondary | ICD-10-CM

## 2010-07-21 DIAGNOSIS — G4733 Obstructive sleep apnea (adult) (pediatric): Secondary | ICD-10-CM

## 2010-07-21 HISTORY — DX: Chronic rhinitis: J31.0

## 2010-07-31 NOTE — Assessment & Plan Note (Signed)
Summary: follow up/ms   Copy to:  Chesley Mires, Blain Pais Primary Provider/Referring Provider:  Daiva Eves, MD  CC:  follow uo osa and copd. Pt states she wears her cpap everynight. Pt states she feels stopped up and feels tightness in her sinus's when using her cpap. Pt states her breathing has been doing "well". .  History of Present Illness: 70 year old with known history of  COPD and OSA on CPAP 12 cm.  Her breathing has been doing well.  She is not using her albuterol much.  She has sinus congestion while using CPAP.  She uses nasal pillows.  She was recently tx with Abx for a sinus infection from her primary physician.  She has lost 19 lbs since joining [employer].   Current Medications (verified): 1)  Symbicort 160-4.5 Mcg/act  Aero (Budesonide-Formoterol Fumarate) .... Inhale 2 Puffs Two Times A Day 2)  Proair Hfa 108 (90 Base) Mcg/act  Aers (Albuterol Sulfate) .... Inhale 2 Puffs Every Four Hours As Needed 3)  Lantus 100 Unit/ml  Soln (Insulin Glargine) .... 75 Units At Bedtime 4)  Adult Aspirin Low Strength 81 Mg  Tbdp (Aspirin) .Marland Kitchen.. 1 By Mouth Once Daily 5)  Actoplus Met Xr 15-1000 Mg Xr24h-Tab (Pioglitazone Hcl-Metformin Hcl) .Marland Kitchen.. 1 By Mouth Two Times A Day 6)  Cpap .... Use At Bedtime [employer], 12 7)  Simvastatin 80 Mg Tabs (Simvastatin) .... Take 1 At Bedtime 8)  Metoprolol Tartrate 50 Mg Tabs (Metoprolol Tartrate) .... Take 1/2 By Mouth Two Times A Day 9)  Tricor 145 Mg Tabs (Fenofibrate) .... Take 1 By Mouth Once Daily 10)  Levothyroxine Sodium 175 Mcg Tabs (Levothyroxine Sodium) .... Take One Tablet By Mouth Every Other Day 11)  Divalproex Sodium 500 Mg Tbec (Divalproex Sodium) .Marland Kitchen.. 1 Tabs By Mouth At Bedtime For Migraines As Needed 12)  Diovan Hct 160-12.5 Mg Tabs (Valsartan-Hydrochlorothiazide) .Marland Kitchen.. 1 By Mouth Daily 13)  Furosemide 40 Mg Tabs (Furosemide) .... Take One Tablet By Mouth Daily.prn 14)  Levothyroxine Sodium 200 Mcg Tabs  (Levothyroxine Sodium) .... Take One Tablet By Mouth Every Other Day Alternating With 151mcg  Allergies (verified): 1)  ! Sulfa  Past History:  Past Medical History: Reviewed history from 09/03/2009 and no changes required. EF 55-65%  .. echo 2008  /   60%... echo... September, 2010 Focal basal septal hypertrophy but no LVOT obstruction Lipomatous hypertrophy of the intra-atrial septum CAD (ICD-414.00)- Minimal..cardiac catheter 2006  /   dobutamine nuclear study August,2010  no ischemia or scar. VENTRICULAR HYPERTROPHY, LEFT (ICD-429.3) HYPERTENSION (ICD-401.9) CAROTID ARTERY DISEASE (ICD-433.10)..Doppler November, 2009.. zero [id number] RICA, [id number] LICA. /  Doppler November, 2010  [id number] RICA, [id number] LICA  stable HYPERLIPIDEMIA (ICD-272.4) LEG EDEMA (ICD-782.3) HYPOTHYROIDISM, POSTSURGICAL (ICD-244.0) HEADACHE (ICD-784.0) DIABETES, TYPE 2 (ICD-250.00) PULMONARY HYPERTENSION, SECONDARY (ICD-416.8) G E R D (ICD-530.81) DIVERTICULOSIS OF COLON (ICD-562.10) GENERALIZED OSTEOARTHROSIS UNSPECIFIED SITE (ICD-715.00) GOITER, UNSPECIFIED (ICD-240.9) OBESITY (ICD-278.00) COPD      - Spirometry 10/19/08 FEV1 1.55 (68%), FVC 2.21 (70%), FEV1% 70      - Intolerant of Spiriva OSA      - PSG 10/06 with AHI 10      - CPAP 12 cm  Past Surgical History: Reviewed history from 05/29/2009 and no changes required. Thyroidectomy 12/09 Back surgery  Social History:  The patient lives in [place], [place], and does not  work and she is married.  She quit smoking 1998. 1- 1 1/2 ppd.  Retired  Vital Signs:  Patient  profile:   70 year old female Height:      66 inches Weight:      249 pounds BMI:     40.33 O2 Sat:      96 % on Room air Temp:     98.2 degrees F oral Pulse rate:   62 / minute BP sitting:   122 / 58  (right arm) Cuff size:   large  Vitals Entered By: Charma Igo (July 21, 2010 9:23 AM)  O2 Flow:  Room air CC: follow uo osa and copd. Pt states she wears her cpap  everynight. Pt states she feels stopped up, feels tightness in her sinus's when using her cpap. Pt states her breathing has been doing "well".  Comments meds and allergies updated Phone number updated Charma Igo  July 21, 2010 9:23 AM    Physical Exam  General:  obese.   Nose:  clear nasal discharge.   Mouth:  no oral lesion Neck:  healed scar is present.  no JVD.   Lungs:  decreased breath sounds, no wheezing or rales. Heart:  regular rate and rhythm, S1, S2 without murmurs, rubs, gallops, or clicks Extremities:  mild non-pitting edema Neurologic:  normal CN II-XII.   Cervical Nodes:  no significant adenopathy   Impression & Recommendations:  Problem # 1:  COPD (ICD-496) Stable.  Continue current inhalers.  Problem # 2:  OBSTRUCTIVE SLEEP APNEA (ICD-327.23)  Discussed techniques to improve mask tolerance.  She may need to try a different mask if these don't work.    Also explained that if she has continued weight loss, then we could reconsider CPAP use.  Problem # 3:  RHINITIS (ICD-472.0)  Will give her sample of nasonex.  Medications Added to Medication List This Visit: 1)  Divalproex Sodium 500 Mg Tbec (Divalproex sodium) .Marland Kitchen.. 1 tabs by mouth at bedtime for migraines as needed 2)  Nasonex 50 Mcg/act Susp (Mometasone furoate) .... Two sprays once daily for two weeks, then as needed  Complete Medication List: 1)  Symbicort 160-4.5 Mcg/act Aero (Budesonide-formoterol fumarate) .... Inhale 2 puffs two times a day 2)  Proair Hfa 108 (90 Base) Mcg/act Aers (Albuterol sulfate) .... Inhale 2 puffs every four hours as needed 3)  Lantus 100 Unit/ml Soln (Insulin glargine) .... 75 units at bedtime 4)  Adult Aspirin Low Strength 81 Mg Tbdp (Aspirin) .Marland Kitchen.. 1 by mouth once daily 5)  Actoplus Met Xr 15-1000 Mg Xr24h-tab (Pioglitazone hcl-metformin hcl) .Marland Kitchen.. 1 by mouth two times a day 6)  Cpap  .... Use at bedtime [facility], 12 7)  Simvastatin 80 Mg Tabs (Simvastatin) .... Take 1 at  bedtime 8)  Metoprolol Tartrate 50 Mg Tabs (Metoprolol tartrate) .... Take 1/2 by mouth two times a day 9)  Tricor 145 Mg Tabs (Fenofibrate) .... Take 1 by mouth once daily 10)  Levothyroxine Sodium 175 Mcg Tabs (Levothyroxine sodium) .... Take one tablet by mouth every other day 11)  Divalproex Sodium 500 Mg Tbec (Divalproex sodium) .Marland Kitchen.. 1 tabs by mouth at bedtime for migraines as needed 12)  Diovan Hct 160-12.5 Mg Tabs (Valsartan-hydrochlorothiazide) .Marland Kitchen.. 1 by mouth daily 13)  Furosemide 40 Mg Tabs (Furosemide) .... Take one tablet by mouth daily.prn 14)  Levothyroxine Sodium 200 Mcg Tabs (Levothyroxine sodium) .... Take one tablet by mouth every other day alternating with 161mcg 15)  Nasonex 50 Mcg/act Susp (Mometasone furoate) .... Two sprays once daily for two weeks, then as needed  Other Orders: Est. Patient Level III [id number])  Patient Instructions: 1)  Nasonex two sprays two times a day for two weeks, then as needed 2)  Increase temperature on CPAP humidifer 3)  Call if mask issues for CPAP persist 4)  Follow up in 6 months Prescriptions: NASONEX 50 MCG/ACT SUSP (MOMETASONE FUROATE) two sprays once daily for two weeks, then as needed  #1 x 2   Entered and Authorized by:   Chesley Mires MD   Signed by:   Chesley Mires MD on 07/21/2010   Method used:   Electronically to        [facility] (retail)       [address]       [place], [place]  [address]       Ph: [phone]       Fax: [phone]   RxID:   [id number]

## 2010-09-16 NOTE — Assessment & Plan Note (Signed)
[facility]                            CARDIOLOGY OFFICE NOTE   Kayla, [name]                     MRN:          [id number]  DATE:03/26/2008                            DOB:          [birth year]    Kayla [name] is seen for cardiology follow-up and to see if she can be  cleared for thyroid surgery.  I saw her last on November 2008.  She has  mild left ventricular hypertrophy and some upper septal thickening.  She  does not have left ventricular outflow tract obstruction.  Catheterization in 2006 revealed very mild coronary disease.  She has  significant COPD and obstructive sleep apnea, and she uses CPAP.  She is  not having any significant anginal chest pain.  She is active.  She had  some slight chest discomfort that does not sound cardiac.  She is going  about usual activities.  She is very bothered by her thyroid and is  hoping that she can have this taken care definitely.   PAST MEDICAL HISTORY:  ALLERGY TO SULFA.   MEDICATIONS:  Lantus, Nexium, aspirin, Symbicort, Avandamet, CPAP,  metoprolol, Diovan, TriCor and simvastatin.   OTHER MEDICAL PROBLEMS:  See the list below.   REVIEW OF SYSTEMS:  She is not having any GI or GU symptoms.  She has no  skin rashes.  There are no fevers or chills or headache.  Her review of  systems otherwise is negative.   PHYSICAL EXAMINATION:  Blood pressure 154/64 with a pulse of 81.  The  patient is oriented to person, time and place.  Affect is normal.  The  patient is overweight.  HEENT:  Reveals no xanthelasma.  She has normal extraocular motion.  There are no carotid bruits.  There is no jugular venous tension.  LUNGS:  Are clear.  Respiratory effort is not labored.  CARDIAC EXAM:  Reveals S1-S2.  There are no clicks or significant  murmurs.  ABDOMEN:  Is soft.  She has no significant peripheral edema.   EKG is normal.  There is normal sinus rhythm.   PROBLEMS INCLUDE:  1. Obstructive sleep apnea on  CPAP followed by Dr. Halford Chessman.  2. Chronic obstructive pulmonary disease followed by Dr. Halford Chessman.  3. Hypertension treated.  4. Some chest discomfort over time.  She is not having significant      angina at this time.  5. Minimal coronary disease by catheterization in 2006.  Secondary      prevention is recommended.  6. Diabetes.  7. Hypertension.  8. History of thyroid goiter.  She is now cleared for thyroid surgery.  9. Normal LV function.  Ejection fraction 55-65% in June 2008.  10.Mild left ventricular hypertrophy.  11.Focal basal septal hypertrophy but no LVOT obstruction.  12.Lipomatous hypertrophy of the inner atrial septum - insignificant.  13.Elevated cholesterol, treated.  14.Carotid disease.  This is moderate.  She is having a follow-up      carotid Doppler today.   The patient is cleared for thyroid surgery.  Her carotid Doppler today  of course needs to be reviewed.  Unless there  is a major change, this  should not interfere with her proceeding to thyroid surgery.     Carlena Bjornstad, MD, [facility]  Electronically Signed    JDK/MedQ  DD: 03/26/2008  DT: 03/26/2008  Job #: [id number]   cc:   Sammuel Hines. Daiva Nakayama, M.D.

## 2010-09-16 NOTE — Assessment & Plan Note (Signed)
[facility]                             PULMONARY OFFICE NOTE   Kayla, [name]                     MRN:          [id number]  DATE:11/12/2006                            DOB:          1940-05-24    I saw Kayla [name] today in follow up for her chronic obstructive pulmonary  disease, obstructive sleep apnea, and secondary pulmonary hypertension.  She had undergone a repeat echocardiogram on October 18, 2006 and this  showed normal left ventricular systolic function with an ejection  fraction of 55% to 65%. The left ventricular wall was moderately  thickened and the pulmonary artery systolic pressure was 33, and overall  this was an improvement from her previous studies. She continues to  complain of cough with clear to yellowish sputum production, as well as  dyspnea with even minimal amounts of activity. She does also have  symptoms of chest pressure and tightness. She says that she will try to  use her inhalers and that this sometimes helps, but sometimes does not.  With regards to her sleep apnea, she continues to use her CPAP machine,  but feels like she is not getting enough pressure. Apparently, there was  some confusion with her home care company with her undergoing a auto  CPAP titration study, and as a result, this has not been done. I believe  the biggest problem was that they did not provide her with a humidifier  for her auto CPAP machine, and as a result, she was unable to tolerate  using this.   CURRENT MEDICATIONS:  1. Lantus 100 units nightly.  2. Nexium 40 mg daily.  3. Aspirin 81 mg daily.  4. Symbicort 160/4.5 mg 2 puffs b.i.d.  5. Diovan 80 mg daily.  6. Avandamet 08/998 mg b.i.d.  7. Zocor 20 mg daily.  8. Combivent 2 puffs q.i.d. as needed.   PHYSICAL EXAMINATION:  VITAL SIGNS:  Weight 234 pounds, temperature  97.6, blood pressure 130/60, heart rate 76, oxygen saturation 95% on  room air.  HEENT:  There was no sinus  tenderness, no nasal discharge, no oral  lesions, no lymphadenopathy.  HEART:  S1, S2.  CHEST:  Prolonged expiratory phase, but no wheezing or rales.  ABDOMEN:  Obese, soft, nontender.  EXTREMITIES:  There was minimal ankle edema.   IMPRESSION:  1. Chronic obstructive pulmonary disease. I would have her continue on      her current regimen of Symbicort and Combivent. Again, there is a      possibility that we may consider initiating her on Theophylline,      but I would like to have her evaluated by cardiology first.      Additionally, depending upon her cardiology evaluation, it may be      beneficial to enroll her in a pulmonary rehabilitation program.  2. Obstructive sleep apnea. I will look into what was the difficulty      with her home care company with having her auto CPAP titration      study done. I will make sure that she is provided with a humidifier  for her auto CPAP titration, and I will have her do this for two      weeks. If she is still having difficulty after this, we may need to      consider having her return to the sleep lab again for an additional      titration study, possibly with the use of BiPAP. She may also need      to have an overnight oximetry, to determine whether she is also      having some degree of oxygen desaturations at night.  3. Hypertension and intermittent episodes of exertional chest pain in      association with dyspnea. While I am still concerned that her      symptoms of dyspnea are related to her obesity, deconditioning, and      chronic obstructive pulmonary disease, she may also have a cardiac      component which may be amenable to further optimization. To assess      this, I will ask her to see Dr. Ron Parker again.   I will follow up in approximately 6 to 8 weeks.     Chesley Mires, MD  Electronically Signed    VS/MedQ  DD: 11/14/2006  DT: 11/15/2006  Job #: [id number]   cc:   Carlena Bjornstad, MD, [facility]

## 2010-09-16 NOTE — Assessment & Plan Note (Signed)
[facility]                             PULMONARY OFFICE NOTE   FRANKLIN, MCNEISH                     MRN:          [id number]  DATE:03/23/2007                            DOB:          1940-11-04    I saw Ms. Mauss in followup today for her COPD and obstructive sleep  apnea.   At her last visit, I had recommended that she be evaluated by cardiology  and had made arrangements for this.  Unfortunately, she says that she  had a colonoscopy scheduled for the same day and, as a result, canceled  her cardiology appointment, but did not want to reschedule it until she  saw me again.  She says she continues to have intermittent episodes of  chest pressure with some discomfort in her neck, associated with  exertion.  She also continues to have problems with dyspnea on exertion.  She is not having much as far as coughing, wheezing or sputum  production.  She is not having much as far as sinus symptoms.  She had  her auto CPAP titration done and I had recommended that she decrease her  pressure to 8, but she was not sure if she wanted to do this and wanted  to speak to me first before this pressure setting was changed.   CURRENT MEDICATIONS:  1. Lantus 100 units q.h.s.  2. Nexium 40 mg daily.  3. Aspirin 81 mg daily.  4. Symbicort 160/4.5 two puffs b.i.d.  5. Diovan 80 mg daily.  6. Avandamet 08/998 mg b.i.d.  7. Zocor 20 mg daily.  8. Combivent two puffs q.i.d. p.r.n.   PHYSICAL EXAM:  She is 233 pounds, temperature is 97.9, blood pressure  is 118/68, heart rate is 62, oxygen saturation 98% on room air.  HEENT:  There is no sinus tenderness, no nasal discharge, no oral  lesions.  HEART:  S1, S2, regular rate and rhythm.  CHEST:  Prolonged expiratory phase, but no wheezing or rales.  ABDOMEN:  Soft, nontender.  EXTREMITIES:  There was no edema.   IMPRESSION:  1. COPD.  She is to continue on her Symbicort and Combivent.  Again,      she was not  able to tolerate the use of Spiriva.  2. Obstructive sleep apnea.  I will decrease her pressure to 8 cm of      water to determine if she is able to tolerate this better, as she      was complaining of sinus headaches.  3. Exertional chest discomfort, associated with dyspnea.  I will again      refer her to cardiology for further evaluation of this.   I will follow up with her in approximately four months.     Chesley Mires, MD  Electronically Signed    VS/MedQ  DD: 03/23/2007  DT: 03/23/2007  Job #: [id number]

## 2010-09-16 NOTE — Op Note (Signed)
NAME:  Kayla [name], Kayla [name] NO.:  [id number]   MEDICAL RECORD NO.:  [id number]          PATIENT TYPE:  OIB   LOCATION:  5128                         FACILITY:  [place]   PHYSICIAN:  Sammuel Hines. Daiva Nakayama, M.D. DATE OF BIRTH:  31-Jan-1941   DATE OF PROCEDURE:  04/17/2008  DATE OF DISCHARGE:  04/18/2008                               OPERATIVE REPORT   PREOPERATIVE DIAGNOSIS:  Large thyroid goiter causing compressive  symptoms.   POSTOPERATIVE DIAGNOSIS:  Large thyroid goiter causing compressive  symptoms.   PROCEDURE:  Total thyroidectomy.   SURGEON:  Sammuel Hines. Daiva Nakayama, MD   ASSISTANT:  Earnstine Regal, MD   ANESTHESIA:  General endotracheal.   PROCEDURE:  After informed consent was obtained, the patient was brought  to the operating room, placed in supine position on the operating table.  After adequate induction of general anesthesia, a roll was placed  between the patient's shoulders to extend the neck.  The patient's neck  and chest area down to the abdomen was then prepped with Betadine and  draped in usual sterile manner.  A low transverse cervical incision was  made with a 15 blade knife.  This incision was carried down through the  skin and subcutaneous tissue and platysma sharply with the  electrocautery.  The platysma layer was then grasped with Allis clamps  and some platysma flaps were created superiorly and inferiorly.  Mahorner retractor was then deployed.  The dissection was carried down  along the midline between the strap muscles until the trachea and  thyroid gland were identified.  The strap muscles were dissected off the  anterior portion of the thyroid gland by combination of sharp Bovie  dissection and some blunt finger dissection.  Once this was  accomplished, the strap muscles were retracted laterally.  Attention was  first turned to the left lobe of the thyroid gland, which was the larger  lobe.  Very slow tedious dissection was carried out along  the lateral  surface of the thyroid gland.  Small vessels were controlled with small  vascular clips and divided with the harmonic scalpel.  The superior pole  was dissected free by circumferentially dissecting the superior pole  vessels.  This was done bluntly with a right-angle clamp.  Once the  superior pole was dissected circumferentially, 2-0 silk ties were passed  around the superior pole and tied off.  Medium clip was placed on the  superior edge of this and then the superior pole vessels were divided  between the two silk ties.  Other smaller vessels were controlled with  medium vascular clips.  Once this was accomplished, the superior pole  was mobile.  We were then able to roll the lateral surface of the  thyroid gland more medial and anterior.  We felt as though we did  identify the recurrent laryngeal nerve and parathyroid glands on the  left and care was taken to preserve all the structures.  The inferior  pole down into the mediastinum, but we were able to initially bluntly  dissect this up into the operative bed and then  again control some of  the smaller vessels with small and medium vascular clips and divide them  with the harmonic scalpel.  Once this was accomplished, we were then  able to dissect the thyroid gland sharply with electrocautery off the  anterior surface of the trachea.  We then examined the right thyroid  gland, although had a more normal size.  It was very nodular and  abnormal in feel.  We therefore decided to remove this lobe as well.  We  dissected the strap muscles off the anterior surface of thyroid gland  sharply with the electrocautery and then with some blunt finger  dissection.  We were able to roll the lateral surface of the thyroid  gland, medial and anterior and control the small vessels with small  vascular clips.  The superior pole vessels were also mobilized by blunt  circumferential dissection with a right-angle clamp and controlled with   medium vascular clips.  Care was taken to identify the recurrent  laryngeal nerve and parathyroid glands and we were able to preserve the  structures on the right as well.  Once we were able to roll the thyroid  gland medial enough to identify all the structures, we were then able to  dissect the thyroid gland off the trachea again using the  electrocautery.  Once this was accomplished, thyroid gland was able to  be removed.  It was marked with a stitch on the left superior pole and  sent to pathology for further evaluation.  We then examined the  operative bed that appeared to be hemostatic.  The wound was irrigated  with copious amounts of saline.  Small pieces of Surgicel were placed on  both sides on the operative bed.  No other abnormalities were noted.  The strap muscles were then reapproximated along the midline with  interrupted figure-of-eight 3-0 Vicryl stitches.  The platysma was also  reapproximated with interrupted 3-0 Vicryl stitches and the skin was  closed with a running 4-0 Monocryl subcuticular stitch.  Dermabond  dressing was applied.  The patient tolerated the procedure well.  At the  end of the case, all needle, sponge, and instrument counts were correct.  The patient was then awakened and taken to recovery in stable condition.      Sammuel Hines. Daiva Nakayama, M.D.  Electronically Signed     PST/MEDQ  D:  04/19/2008  T:  04/20/2008  Job:  [id number]

## 2010-09-16 NOTE — Assessment & Plan Note (Signed)
[facility]                             PULMONARY OFFICE NOTE   Kayla, [name]                     MRN:          [id number]  DATE:10/05/2006                            DOB:          November 07, 1940    I saw Ms. Kayla [name] in followup today for her COPD, obstructive sleep apnea,  rhinitis, and secondary pulmonary hypertension.   Since her last visit, she says that her breathing continues to get  worse.  She feels that the increase in the Symbicort to 160/4.5 two  puffs b.i.d. has helped, although she still needs to use her albuterol  inhaler on a fairly regular basis.  She has intermittent episodes of  coughing.  She says her symptoms actually appear to be worse at night.  She has difficulty sleeping flat, and will wake up about 2-3 hours after  she goes to sleep.  She uses her CPAP machine on a regular basis, and  apparently does not have any difficulty with this.  In spite of this,  she is still having some difficulty with sleep destruction.   CURRENT MEDICATIONS:  1. Lantus 100 units nightly.  2. Nexium 40 mg daily.  3. Aspirin 81 mg daily.  4. Spiriva 1 puff daily, which she has stopped using about 3 days ago.  5. Symbicort 160/4.5 two puffs b.i.d.  6. Diovan 80 mg daily.  7. Avandamet 08/998 mg b.i.d.  8. Zocor 20 mg daily.  9. Albuterol as needed.   PHYSICAL EXAMINATION:  She is 234 pounds, temperature is 97.9, blood  pressure is 122/54, heart rate is 70, oxygen saturation is 93% on room  air.  HEENT:  There is no sinus tenderness, no nasal discharge, no oral  lesions, no lymphadenopathy.  HEART:  S1, S2.  CHEST:  There is no wheezing.  ABDOMEN:  Obese, soft, nontender.  EXTREMITIES:  No edema.   IMPRESSION:  1. Chronic obstructive pulmonary disease.  Since she did seem to have      some benefit from the use of Symbicort, I would continue her on      this as well as the albuterol as needed.  I would have her      discontinue her  Spiriva since she did not really notice much of a      difference while she was on this, and certainly has not noticed any      worsening since she has had this discontinued.  I would try her on      Atrovent to see if she does gain any symptomatic benefit from that,      although I am somewhat skeptical about this.  2. Obstructive sleep apnea.  I am concerned that she may actually be      having residual apneic events, particularly during REM sleep, and      that may explain why she is having the sleep pattern that she is.      To further assess this, I will have her undergo an auto CPAP      titration study for approximately 2 weeks.  If the issue  is not      resolved after this, then I have instructed her that she would      likely need to be returned to the sleep lab for a repeat titration      study.  3. Secondary pulmonary hypertension with persistent dyspnea.  I will      have her undergo a repeat echocardiogram.  The concern that I have      is that her symptoms of dyspnea seem to be somewhat out of      proportion to her spirometric findings for her chronic obstructive      pulmonary disease, and that the pulmonary hypertension may actually      be contributing to this rather than just as a secondary disorder.      I will have her undergo an echocardiogram to further assess this,      then depending upon the results of that, I would also decide if she      would need to have repeat evaluation by cardiology.  Again, she was      seen by Dr. Ron Parker during her hospitalization in 2006.  4. I will follow up with her in approximately 1 month.     Chesley Mires, MD  Electronically Signed    VS/MedQ  DD: 10/05/2006  DT: 10/05/2006  Job #: [id number]

## 2010-09-16 NOTE — Assessment & Plan Note (Signed)
[place]                            CARDIOLOGY OFFICE NOTE   Kayla [name], Kayla [name]                     MRN:          [id number]  DATE:03/24/2007                            DOB:          1940-08-05    HISTORY OF PRESENT ILLNESS:  Kayla [name] is seen for cardiac evaluation.  I had seen her last in October 2006.  She has mild left ventricular  hypertrophy.  She has upper septal thickening.  She has never had a  documented outflow tract obstruction.  She has  very mild coronary  disease with two 30% lesions by cath in 2006.  She had a significant  COPD and obstructive sleep apnea.  She does use CPAP.  She has other  risk factors including diabetes and hypertension.   Dr. Halford Chessman has been working carefully with the patient concerning her  shortness of breath.  On a few occasions recently, she has noted some  chest discomfort.  This is not with exertion.  It tends to occur later  in the day, and she can have some discomfort with radiation to her arms.  She continues to go about full activities after she rests.  There is no  particular pattern to this symptom.  She is now here for further cardiac  evaluation.   PAST MEDICAL HISTORY:   ALLERGIES:  SULFA.   MEDICATIONS:  Lantus, Nexium, aspirin, Symbicort, Avandamet, CPAP,  metoprolol, Diovan, hydrochlorothiazide, Tri-Chlor, Combivent,  Simvastatin.   OTHER MEDICAL PROBLEMS:  See the list below.   REVIEW OF SYSTEMS:  Her shortness of breath is the key issue.  She knows  she has significant lung problems, and she knows she needs to loose  weight.  We have not felt historically that there was an ischemic  component to this shortness of breath, but this always needed to be kept  in mind.  Otherwise, her review of systems is negative.   PHYSICAL EXAMINATION:  VITAL SIGNS:  Weight 233 pounds, blood pressure  143/68, pulse 83.  GENERAL:  The patient is oriented to person, place and time, affect is   normal.  HEENT:  No xanthelasma.  She has normal extraocular motion.  There is  question of a right carotid bruit.  There is no jugular venous  distention.  LUNGS:  Clear.  Respiratory effort is not labored at this time.  CARDIAC:  S1, S2.  No clicks or significant murmurs.  ABDOMEN:  Obese but soft.  She has no significant peripheral edema at  this time.   STUDIES:  EKG is normal.   PROBLEMS:  1. Obstructive sleep apnea on CPAP, treated by Dr. Halford Chessman.  2. COPD, treated by Dr. Halford Chessman.  3. Hypertension.  4. Episodes of chest discomfort.  She tells me that this does not      occur with exertion.  There is a question from other prior history      that there may be an exertional component to it.  I will keep this      in mind.  However, she had only minimal disease in 2006.  This does  not rule out progression, but I am not convinced that there is      substantial progression.  I have chosen to plan to see her back to      follow her medically at this point, and we will do that in three      months.  5. Diabetes.  6. Hypertension.  7. History of a thyroid goiter.  8. Good LV function with an ejection fraction of 55-65% by echo in      June 2008.  9. Left ventricular hypertrophy with focal basal septal hypertrophy.  10.Lipomatous hypertrophy of the intraatrial septum which is an      insignificant clinical finding.  11.Elevated cholesterol.  12.We will obtain a carotid Doppler and I will her for follow up in      three months.     Carlena Bjornstad, MD, [facility]  Electronically Signed    JDK/MedQ  DD: 03/24/2007  DT: 03/25/2007  Job #: [id number]   cc:   Chesley Mires, MD

## 2010-09-19 NOTE — H&P (Signed)
NAME:  Kayla [name], Kayla [name] NO.:  [id number]   MEDICAL RECORD NO.:  [id number]          PATIENT TYPE:  EMS   LOCATION:  MINO                         FACILITY:  [place]   PHYSICIAN:  Dola Argyle, M.D.     DATE OF BIRTH:  [birth year]   DATE OF ADMISSION:  01/08/2005  DATE OF DISCHARGE:                                HISTORY & PHYSICAL   Kayla [name] has shortness of breath and chest pain.  She was sent from Dr.  Onnie Boer office in [place] to the emergency room  today.  She was assessed  very nicely by Dr. Mylinda Latina.  Dr. Monia Pouch ruled out pulmonary embolus.  D-dimer is normal.  He was concerned about her shortness of breath and chest  pain and felt that admission to the cardiology service was appropriate and  the patient was referred to me.  I have seen the patient and spoken with her  and her husband at length.   The patient has had shortness of breath.  She had this problem several years  ago and had an outpatient exercise test that sounds like a nuclear study  done through another group other than ours in the past.  The patient does  not follow on a regular basis with a cardiologist.  She has a cough.  She  has GERD and there is a question that some of her symptoms have been related  to her GERD.  However, she is very bothered by her shortness of breath.  In  addition, she now describes chest discomfort with radiation to her jaw.  She  has had this for several days.   In the emergency room, she was assessed and was stable.  Her point of care  enzymes are all normal.  BNP was 161 and her chest x-ray was normal.  EKG is  reported as normal.   ALLERGIES:  Sulfa.   MEDICATIONS:  Avandia 4 mg b.i.d., Metformin 500 mg p.o. b.i.d., Lantus  insulin 50 units subcu in the evening, aspirin 81 mg daily, blood pressure  medicine that is unknown at this point, and Nexium daily.   PAST MEDICAL HISTORY:  See the complete list below.   SOCIAL HISTORY:  The patient lives in  [place], [place], and does not  work and she is married.  She quit smoking four years ago.   FAMILY HISTORY:  The family history is positive for coronary artery disease.   REVIEW OF SYSTEMS:  The patient has not had any fevers or temperatures.  She  has had no major skin problems.  She has had some headaches and this is a  chronic problem and there has been no change recently.  She wears glasses.  She is not having any major dental problems.  The patient does have reflux.  She also has a cough.  She is eating without difficulty and having normal  bowel movements.  She has no urinary problems.  The patient does have  arthritis in her knees and feet.  Otherwise, her review of systems is  negative.   PHYSICAL EXAMINATION:  VITAL SIGNS:  Blood pressure 145/65, pulse 75, respirations 19, temperature  97.5, pulse ox is 96%.  GENERAL:  The patient is oriented to person, time, and place.  Her affect is  normal.  LUNGS:  Clear.  Respiratory effort is not labored.  HEENT:  No xanthelasma.  She has normal extraocular motion.  NECK:  There are no carotid bruits.  There is no jugular venous distention.  HEART:  S1 and S2.  There are no clicks or significant murmurs.  ABDOMEN:  Obese but soft.  There are no masses and no bruits.  EXTREMITIES:  There is no peripheral edema. Distal pulses are 2+.  There are  no major  musculoskeletal deformities. The patient is obese.   LABORATORY DATA:  Blood studies revealed that her troponins are normal.  BNP  is 161 and her D-dimer is normal.  Chest x-ray is reported as normal and EKG  is reported as normal.  I will have to obtain another copy for review.  BUN  21, creatinine 1.1.  INR 0.9.  Hemoglobin is 9.8.   PROBLEM LIST:  1.  Shortness of breath.  This is at rest and with exercise and the etiology      is not clear.  It is possible that this could be ischemic and we will      work up this part of the evaluation.  She may need pulmonary evaluation       as an outpatient later.  2.  GERD.  Question whether this is related to her cough.  3.  Chest pain.  I am concerned that this could represent ischemia.  I have      reviewed this completely with her and we will proceed with cardiac      catheterization on January 09, 2005.  4.  Diabetes.  5.  Hypertension.  6.  History of back surgery.  7.  Arthritis in the knees and feet.  8.  Sulfa allergy.  9.  Stopped smoking four years ago.  10. Obesity.  11. Anemia.  Her stools will be checked and she will need further evaluation      of her anemia.           ______________________________  Dola Argyle, M.D.     JK/MEDQ  D:  01/08/2005  T:  01/08/2005  Job:  [id number]   cc:   Daiva Eves, M.D.

## 2010-09-19 NOTE — Assessment & Plan Note (Signed)
Kayla [name]                               PULMONARY OFFICE NOTE   Kayla [name], Kayla [name]                     MRN:          [id number]  DATE:12/24/2005                            DOB:          13-Sep-1940    CHIEF COMPLAINT:  Increased shortness of breath x2 weeks.  Her baseline  ability to ambulate is approximately 25 feet.  She is currently now at 12  feet ambulation with shortness of breath.   HISTORY OF PRESENT ILLNESS:  Kayla [name] is a 227 pound, white female  who Dr. Halford [name] sees for obstructive sleep apnea.  She presents today with  aforementioned symptoms.  She notes no fevers, chills or sweats.  She notes  she is congested in the morning.  She is treated with CPAP for obstructive  sleep apnea.  She notices in the morning she does have increasing cough with  yellow mucous that clears once her mask comes off.  She notes a 1 week  swelling of hands and feet and swelling.  I note in her past records, she  has been on hydrochlorothiazide which was stopped approximately 3 months ago  by her primary care physician, Dr. Fenton [name], in [place].  Her two  areas of concern are the increasing shortness of breath and her swelling.   MEDICATIONS:  1. She is off her hydrochlorothiazide now.  2. Lopressor 50 mg 1/2 daily.  3. Avandia 4 mg b.i.d.  4. Lantus 60 units nightly.  5. Aspirin 81 mg daily.  6. Advair 250/50 one puff twice a day.  7. Spiriva 18 mcg one daily.  8. Avandamet 4/100 daily.  9. Prilosec 20 mg daily.  10.Diovan 80 mg daily.  11.Albuterol p.r.n.   ALLERGIES:  SULFA.   PHYSICAL EXAMINATION:  GENERAL:  This is a well-developed, well-nourished,  morbidly obese, white female at 227 pounds.  HEENT:  No JVD is appreciated.  Bilateral nares are unremarkable.  Tympanic  membranes are unremarkable.  Oropharynx is unremarkable.  CHEST:  Left greater than right basilar crackles, otherwise clear.  CARDIAC:  Heart sounds are  regular.  Regular rate and rhythm.  VITAL SIGNS:  Blood pressure 128/64, pulse 95.  ABDOMEN:  Soft, nontender.  EXTREMITIES:  2+ pitting edema in lower extremities with mild edema in the  upper extremities.  She presents with lab work from her physicians office  from [facility] which demonstrates no abnormalities.   LABORATORY DATA AND X-RAY FINDINGS:  Sodium 142, potassium 4.4, BUN 22,  creatinine 1.0.   ASSESSMENT/PLAN:  Suspect volume overload.  She had been off her  hydrochlorothiazide x3 months.  Therefore, she will be begun on Lasix 20 mg  daily x3 days and then change to 20 mg p.r.n. daily.  For completeness sake,  we will check a PA and lateral chest x-ray to evaluate since she has been  complaining of congestion to rule out any acute pulmonary process.  She is  scheduled to follow up with Dr. Chesley [name] within 2-3 weeks and the chest x-  ray will be reviewed by him at that time.  Kayla Lambert, MSN, ACNP                                Kayla Mires, MD   SM/MedQ  DD:  12/24/2005  DT:  12/25/2005  Job #:  [id number]

## 2010-09-19 NOTE — Discharge Summary (Signed)
NAME:  Kayla [name], DIGUGLIELMO NO.:  [id number]   MEDICAL RECORD NO.:  [id number]          PATIENT TYPE:  INP   LOCATION:  3709                         FACILITY:  [facility]   PHYSICIAN:  Eustace Quail, M.D. [facility] DATE OF BIRTH:  Oct 09, 1940   DATE OF ADMISSION:  01/08/2005  DATE OF DISCHARGE:  01/12/2005                                 DISCHARGE SUMMARY   PRIMARY CARDIOLOGIST:  Dr. Dola Argyle.   PRIMARY CARE PHYSICIAN:  Dr. Lisbeth Ply in [place].   PULMONOLOGIST:  Dr. Halford Chessman.   DISCHARGING DIAGNOSES:  1.  Non-cardiac chest pain, status post cardiac catheterization showing mild      nonobstructive coronary artery disease with irregularities in the left      anterior descending, 30% narrowing in the distal segments, 30% proximal,      and 30% mid-stenosis in the right coronary artery with normal left      ventricular function.  2.  Moderate elevation of pulmonary artery wedge and pulmonary artery      pressures.  3.  Enlarged thyroid, left greater than right, most consistent with goiter      by CT on January 10, 2005.  4.  Gastroesophageal reflux disease.   PAST MEDICAL HISTORY:  1.  Diabetes, insulin-dependent.  2.  Hypertension.  3.  History of back surgery.  4.  Arthritis in the knees and feet.  5.  Allergy to sulfa.  6.  History of tobacco abuse.   PROCEDURES PERFORMED:  1.  CT of the chest on January 10, 2005:  No evidence of pulmonary      embolism, enlarged thyroid most consistent with goiter.  2.  Chest x-ray on January 08, 2005 showing no active cardiopulmonary      disease.  3.  Cardiac catheterization on January 12, 2005 by Dr. Eustace Quail,      results as stated above.   HISTORY OF PRESENT ILLNESS:  Kayla [name] is a 70 year old Caucasian female who  lives in [place], [place] and is cared for by Dr. Lisbeth Ply.  She was  actually sent from Dr. Onnie Boer office in [place] to the emergency room on  day of admission for shortness of breath and chest  discomfort.  The patient  was ruled out for a pulmonary embolism.  D-dimer was normal.  It was felt  the patient's shortness of breath and chest pain deserved further cardiac  workup; the patient was referred to our group for further evaluation,  initial blood work showing troponins within normal limits, BNP slightly  elevated at 161, chest x-ray reported as normal, EKG without ST or T wave  changes, BUN 21, creatinine 1.1, hemoglobin was 9.8.   HOSPITAL COURSE:  Patient was admitted for further evaluation and rule-out  MI, monitored over the weekend, scheduled for cardiac catheterization on  Monday, patient with mild nonobstructive coronary artery disease and  moderate elevation of pulmonary artery wedge and pulmonary artery pressures.  Pulmonary was asked to consult secondary to the patient's long history of  smoking.  The patient tolerated cardiac catheterization without  complications.  Dr. Halford Chessman in to see the  patient on January 12, 2005, post  catheterization, patient with significant smoking history, who will need  PFTs to exclude underlying COPD.  She also has some symptoms suggestive of  sleep apnea, so sleep study may be warranted.  Further evaluation of the  patient could be done as an outpatient setting.  Patient given a  prescription for albuterol to use p.r.n. in the interim.   FOLLOWUP PLANS:  The patient will follow up with Dr. Halford Chessman on January 21, 2005 at 3:15 for reevaluation of her pulmonary status, post-cardiac-cath  check on January 28, 2005 at 12:15 with Margarite Gouge and then an  echocardiogram on January 28, 2005 at 1 p.m.   LABORATORY WORK:  Lab work prior to discharge includes blood gas on  January 10, 2005 on room air, a pH of 7.41, PCO2 of 39.8, PO2 of 66.1 with  a bicarb of 24.9, an O2 SAT of 91.5%; hemoglobin 10.5, hematocrit 30.9; a  sed rate of 42; sodium 137, potassium 3.5, BUN 21, creatinine 0.9; TSH of  0.576; total iron 72, vitamin B12 353 and  a serum folate of greater than 20.  Fecal Hemoccult was negative.   DISCHARGE MEDICATIONS:  Discharging medications include previous medications  prior to this admission which are:  1.  Hydrochlorothiazide 25 mg daily.  2.  Lopressor 50 mg daily.  3.  Avandia 4 mg p.o. b.i.d.  4.  Lantus 40 units at bedtime.  5.  Glucophage 1000 mg p.o. b.i.d., patient instructed to resume her      Glucophage with the p.m. dose on January 14, 2005.  6.  TriCor 145 mg daily.  7.  Nexium 40 mg daily.  8.  Aspirin 81 mg daily.  9.  She has a prescription for albuterol inhaler two puffs 4 times a day as      needed.   DIET:  Diabetic diet.   ACTIVITY:  Activity as tolerated, may shower.  No tub-bathing x2 days.  May  walk up steps.  No driving for 2 days.  No lifting over 10 pounds x1 week.   COMMENT:  Please note, we will clarify the Lopressor dose with patient as  she is taking 50 mg daily instead of b.i.d. and cannot locate previous  documentation to confirm dose.   DURATION OF DISCHARGE:  Less than 30 minutes.      Rosanne Sack, NP    ______________________________  Eustace Quail, M.D. [facility]    MB/MEDQ  D:  01/12/2005  T:  01/13/2005  Job:  [id number]   cc:   Dola Argyle, M.D.  [address]  [address]  [place]  [place] [address]   [place], [place] Hamrick, Maura MD   Chesley Mires, M.D.

## 2010-09-19 NOTE — Assessment & Plan Note (Signed)
[facility]                               PULMONARY OFFICE NOTE   Kayla, [name]                     MRN:          [id number]  DATE:01/11/2006                            DOB:          27-Apr-1941    I saw Kayla [name] in followup today for her COPD, sleep apnea, and secondary  pulmonary hypertension.  She says that she is still having difficulty with  regards to feeling short of breath with exertion.  She says that as a result  of this, she is quite limited in her exercise tolerance and is unable to do  any activities to assist with her attempt at weight reduction.  She is  having only minimal amounts of cough and not much as far as sputum  production.  She is not having much difficulty with sinus congestion and  post nasal drip.  She is not having much with regards to reflux either.  She  says she is only able to walk about maybe 50-100 feet at the present time  before getting short of breath but that her breathing improves quickly when  she returns to a resting position.   CURRENT MEDICATIONS:  1. Lopressor 25 mg every day.  2. Avandia 4 mg b.i.d.  3. Lantus 60 units q.h.s.  4. Aspirin 81 mg every day.  5. Advair 250/50, one puff b.i.d.  6. Spiriva 1 puff every day.  7. Diovan 80 mg every day.  8. Avandomet 4/100 mg every day.  9. Prilosec 20 mg every day.  10.She is using Ventolin 2 puffs q.i.d. p.r.n.   PHYSICAL EXAMINATION:  VITAL SIGNS:  She is 225 pounds, temperature is 98.4,  blood pressure is 120/76, heart rate is 73, oxygen saturation 97% on room  air.  HEENT:  There is no sinus tenderness, no nasal discharge, no oral lesions.  HEART:  S1 S2.  CHEST:  Decreased breath sounds but no wheezing or rales.  ABDOMEN:  Soft nontender.  Obese.  EXTREMITIES:  There was no edema.   IMPRESSION:  1. Chronic obstructive pulmonary disease with increasing dyspnea.  What I      will do is I will continue her on her inhaler regimen of Spiriva  but I      will change her over to Symbicort 80/4.5 two puffs b.i.d. to see if      this will help improve her symptom status.  If not, then I would give      consideration to adding possibly theophylline to see if this helps with      her symptoms status.  2. Obstructive sleep apnea.  She appears to be doing quite well on her      current regimen of      CPAP and I have encouraged her to maintain her compliance with this and      I plan on having have her follow up with me in approximately 6 weeks.  Kayla Mires, MD   VS/MedQ  DD:  01/19/2006  DT:  01/20/2006  Job #:  [id number]

## 2010-09-19 NOTE — Assessment & Plan Note (Signed)
[facility]                               PULMONARY OFFICE NOTE   Kayla [name], Kayla [name]                     MRN:          [id number]  DATE:03/10/2006                            DOB:          1941-04-05    I saw Kayla [name] in followup today for her COPD and obstructive sleep apnea  with secondary pulmonary hypertension.  I had received a letter from her  pharmacy stating that she wanted to request having her inhaler regimen  switched to nebulizer treatments.  Upon questioning Kayla [name], she had said  that this was started more because she was hoping that this would decrease  the expense of her medications rather than feeling like the nebulizers would  be better treatment for her.  In fact, she says that since switching to  Symbicort she feels like she is doing much better with her breathing and she  only needed to use her albuterol inhaler on a sporadic basis.  She continues  to use the Spiriva as well.  She says, however, that over the last 1 to 2  week she has noticed that she has been getting more congestion with  coughing, which is now yellowish in color, when normally it is clear.  She  does occasionally get some tightness in her chest, but has not had any  wheezing.  She denies having any fevers.  With regards to her sleep apnea,  she says that she is getting much more used to using the mask.  She will  typically go to bed at about 11:00 and wears the mask until 5.  She says she  wakes up to use the bathroom and leaves the mask off while she sleeps again  anywhere from 30 minutes to 2 hours.  She says that she does feel that the  CPAP machine helps with her breathing at night, however.   PHYSICAL EXAM:  She weighs 226 pounds, temperature is 97.4, blood pressure  is 112/60, heart rate is 67, oxygen saturation is 98% on room air.  HEENT:  There is no sinus tenderness.  No nasal discharge.  No oral lesions.  No lymphadenopathy.  HEART:  S1, S2.  CHEST:  No wheezing.  ABDOMEN:  Soft.  EXTREMITIES:  No edema.   IMPRESSION:  1. Chronic obstructive pulmonary disease with an acute exacerbation.  I      will give her a prescription for a Z-Pak and have her continue on her      Spiriva, Symbicort, and albuterol as needed.  I do not feel that she      will need a course of prednisone at this time.  She says that she is      going to get her flu shot with her husband at their local pharmacy.      With regards to the use of nebulizer treatments, I had explained to her      that they would not offer her any benefit with regards to her      treatment, and that if it is not much of a difference between the  costs, that she would probably be better off remaining on her inhaler      regimens and she was agreeable to this.  2. Obstructive sleep apnea.  She appears to be tolerating her continuous      positive airway pressure machine much better.  I have advised her,      however, that she should wear her continuous positive airway pressure      machine for the entire time that she is asleep, particularly since she      seems to be not using it during the time periods where she would be      expected to have most of her rapid eye movement sleep, which is in fact      probably the time where she needs to use it the most.  I have relayed      this information to her and, now that      she has a better understanding of this, she says that she will use the      continuous positive airway pressure machine for the entire time that      she is asleep.   I will make arrangements for her to follow up with me in 4 months.     Chesley Mires, MD  Electronically Signed    VS/MedQ  DD: 03/10/2006  DT: 03/10/2006  Job #: [id number]

## 2010-09-19 NOTE — Procedures (Signed)
NAME:  Kayla, [name] NO.:  [id number]   MEDICAL RECORD NO.:  [id number]          PATIENT TYPE:  OUT   LOCATION:  SLEEP CENTER                 FACILITY:  [facility]   PHYSICIAN:  Chesley Mires, M.D.      DATE OF BIRTH:  [birth year]   DATE OF STUDY:  03/08/2005                              NOCTURNAL POLYSOMNOGRAM   INDICATION FOR STUDY:  This is a 70 year old female who had undergone an  overnight polysomnogram on February 08, 2005, which showed mild to moderate  obstructive sleep apnea with a apnea/hypopnea index of 9.8 with an oxygen  saturation nadir of 80% and a REM apnea/hypopnea index of 28.5, and she  returns to the sleep laboratory for a CPAP titration study.   EPWORTH SLEEPINESS SCORE:  6   MEDICATIONS:  Metformin, Lopressor, Avandia, Nexium, TriCor, Lantus, Lyrica,  and hydrochlorothiazide.   SLEEP ARCHITECTURE:  Total recording time was 396.5 minutes. Total sleep  time was 229 minutes. Sleep efficiency was 58%. The patient was observed in  all stages of sleep but only had minimal periods of supine sleep.   RESPIRATORY DATA:  The patient was titrated from CPAP of 5 to 11 cmH2O. At 9  cmH2O the apnea/hypopnea index was reduced to 3.7, oxygenation was stable,  and snoring was reduced, and the patient was observed in REM sleep at this  pressure setting. However, she was not observed in the supine position, and  in her initial sleep study she did not have supine sleep then either.   CARDIAC DATA:  Showed normal sinus rhythm.   MOVEMENT/PARASOMNIA:  The periodic limb movement index was 20.2, which is  elevated, and in her prior sleep study her periodic limb movement index was  elevated at that time as well.   IMPRESSION/RECOMMENDATION:  This is a CPAP titration study. At a pressure  setting of 9 cmH2O her apnea/hypopnea index was reduced to 3.7 and her  oxygenation stabilized, and she was observed in REM sleep but she was not  observed in supine sleep at this  setting. Therefore, she should be started  on CPAP of 9 cmH2O and monitored for symptom improvement. If she is still  symptomatic after this, further consideration could be made for an auto CPAP  titration versus a repeat CPAP titration study in the sleep laboratory.  Additionally, she had increased periodic limb movement index. This can be  seen with initiation of CPAP until her sleep apnea is under adequate  control. Therefore, clinical correlation would be necessary. If she does,  however, have symptoms suggestive of restless legs syndrome, then further  medical intervention may be warranted.      Chesley Mires, M.D.  Diplomat, [employer] of Sleep Medicine  Electronically Signed     VS/MEDQ  D:  03/17/2005 16:44:08  T:  03/18/2005 11:07:31  Job:  [id number]

## 2010-09-19 NOTE — Assessment & Plan Note (Signed)
[facility]                               PULMONARY OFFICE NOTE   NYYA, DUSKIN                     MRN:          [id number]  DATE:11/18/2005                            DOB:          Sep 25, 1940    I saw Ms. Sherman today for evaluation of her increased cough and worsening  dyspnea.  She says it has been happening over the last two weeks.  She  denies having any fever or sputum production.  She says that if she does any  kind of activity she gets shortness of breath.  She says this has been more  prominent since the change in the weather.  She says she also has been  getting a headache for the last two weeks which she attributed to the use  Ventolin and was switched to another form of albuterol.  However, around the  same time she was switched from her nasal mask to nasal pillows.  She says  however that with regards to her sleep apnea she is sleeping reasonably well  otherwise and does not have any complaints, although she is starting to get  some nasal irritation.   CURRENT MEDICATIONS:  1.  Lopressor 25 mg q. day.  2.  Avandia 4 mg b.i.d.  3.  Lantus 60 units q. h.s.  4.  Aspirin 81 mg q. day.  5.  Advair 250/50 1 puff q. 12 hours.  6.  Spiriva 1 puff q. day.  7.  Diovan 80 mg q. day.  8.  Avandamet 08/998 mg q. day.  9.  Prilosec 20 mg q. day.  10. Albuterol p.r.n.   PHYSICAL EXAMINATION:  VITAL SIGNS:  Weight is 227 pounds.  Temperature is  97.7.  Blood pressure 136/60.  Heart rate is 79.  Oxygen saturation 97% on  room air.  HEENT:  There was no sinus tenderness, no nasal discharge, no oral lesions.  No lymphadenopathy, no thyromegaly.  HEART:  Regular rate and rhythm.  CHEST:  No wheezing or rales.  ABDOMEN:  Obese, soft, non-tender.  EXTREMITIES:  No edema, cyanosis or clubbing.   IMPRESSION:  1.  Chronic obstructive pulmonary disease, obstructive sleep apnea and      secondary pulmonary hypertension with some dyspnea and  cough over the      last two weeks.  At this time, what I would recommend for her is that      she have a tapering dose of prednisone for the next week in addition to      increasing her Prilosec to 40 mg a day.  To continue on her Advair,      Spiriva and albuterol p.r.n.  With regards to her headaches I advised      her to try using her nasal mask to see if this makes a difference as      using nasal pillows could be causing sinus pressure which could be      contributing to her headaches.  If she is still symptomatic after this,      then consideration could be given to having her  evaluated again by      cardiology in addition to giving further consideration to adding      treatment for her secondary pulmonary artery hypertension - although I      would be somewhat reluctant to do this as the data on this is not      complete and the treatments are rather expensive.  I plan on following      up her in three to four weeks.                                   Chesley Mires, MD   VS/MedQ  DD:  11/19/2005  DT:  11/19/2005  Job #:  [id number]

## 2010-09-19 NOTE — Assessment & Plan Note (Signed)
[facility]                             PULMONARY OFFICE NOTE   Kayla [name], Kayla [name]                     MRN:          [id number]  DATE:12/14/2006                            DOB:          Nov 01, 1940    I had received a copy of the CPAP download for [name] and this showed  that a pressure setting of 8 would likely be optimal for her, with an  overall reduction in her apnea/hypopnea index to 4.3.  She had the  machine for 10 days with 70% of the days with use, and the average use  of 5 hours on the days that she used her machine, and there did not  appear to be much problems with air leak. I will therefore make  arrangements for her pressure to be changed to 8 for her CPAP machine  and follow up with her in the office afterwards.     Chesley Mires, MD  Electronically Signed    VS/MedQ  DD: 12/14/2006  DT: 12/15/2006  Job #: [id number]

## 2010-09-19 NOTE — Assessment & Plan Note (Signed)
[facility]                             PULMONARY OFFICE NOTE   Kayla [name], Kayla [name]                     MRN:          [id number]  DATE:07/07/2006                            DOB:          19-Nov-1940    I saw Kayla [name] today in followup for her COPD and obstructive sleep  apnea.   She says that she has recently had an episode of shortness of breath  with some congestion and yellowish colored sputum over the last 2 weeks.  She was having some wheezing as well as nasal congestion and she had had  an episode of diarrhea.  This seemed to have improved to some degree  now.   With regards to her CPAP, she seems to be doing quite well.  She is  using it the entire night that she is asleep, which is about 9 hours and  has no difficulty with this at the present time.   She continues to use Spiriva 1 puff daily and Symbicort 80/4.5 mg 2  puffs b.i.d.  She was confused about whether she could use the Ventolin  inhaler as well and as a result has not been using it.   PHYSICAL EXAMINATION:  She is 231 pounds, which is increased 5 pounds  from her last visit.  Temperature is 97.6, blood pressure is 110/48,  heart rate is 72, oxygen saturation 96% on room air.  HEENT:  There is no sinus tenderness, no nasal discharge, no oral  lesions, no lymphadenopathy.  HEART:  S1, S2.  CHEST:  There was no wheezing or rales.  ABDOMEN:  Obese, soft, nontender.  EXTREMITIES:  There was no edema.   ASSESSMENT:  1. Chronic obstructive pulmonary disease.  I will have her increase      her dose of Symbicort to 160/4.5 mg 2 puffs b.i.d. and continue her      on the Spiriva.  I also advised her that she could use her Ventolin      as needed but that she does not need to use this on a regular      basis.  Additionally, I will refer her to pulmonary rehab as I hope      that this will help with her exercise tolerance.  Additionally, I      do not feel that she needs to have a  course of prednisone or      antibiotics at this time.  2. Rhinitis.  I have instructed her on the use of nasal irrigation.  3. Obstructive sleep apnea.  She is to continue on her current      settings of CPAP.   I will follow up with her in approximately 3 months, but I have advised  her to call me if she does not gain symptomatic benefit with the  increase in the dose of the Symbicort.    Chesley Mires, MD  Electronically Signed   VS/MedQ  DD: 07/07/2006  DT: 07/07/2006  Job #: [id number]

## 2010-09-19 NOTE — Cardiovascular Report (Signed)
NAME:  Kayla [name], Kayla [name] NO.:  [id number]   MEDICAL RECORD NO.:  [id number]          PATIENT TYPE:  INP   LOCATION:  3709                         FACILITY:  [facility]   PHYSICIAN:  Eustace Quail, M.D. [facility] DATE OF BIRTH:  [birth year]   DATE OF PROCEDURE:  01/12/2005  DATE OF DISCHARGE:                              CARDIAC CATHETERIZATION   PROCEDURE:  Right and left heart cardiac catheterization.   INDICATIONS FOR PROCEDURE:  Kayla [name] is 70 years old and was admitted  through the emergency room with symptoms of chest pain and shortness of  breath.  She had a D-dimer in the emergency room by Dr. Monia Pouch which was  negative, and she was admitted on Dr. Kae Heller service.  She has a long  history of heavy smoking.  She also has hypertension and diabetes.   DESCRIPTION OF PROCEDURE:  The procedure was performed by the right femoral  artery using an arterial sheath and 6 French preformed coronary catheters.  A femoral arterial wall puncture was performed, and Omnipaque contrast was  used.  Right heart catheterization was performed percutaneously via the  right femoral vein using a venous sheath and Swan-Ganz thermodilution  catheter.  A __________ was performed to rule out renovascular causes for  hypertension.  We were unable to close the right femoral artery due to a  high bifurcation.  The patient tolerated the procedure well and left the  laboratory in satisfactory condition.   RESULTS:  The left main coronary artery is free of significant disease.   The left anterior descending artery gave rise to two diagonal branches, two  small septal perforators, and a third small diagonal branch.  The LAD was  irregular, but there was no significant obstruction.   The circumflex artery gave rise to a ramus branch, a marginal branch, and  two posterolateral branches.  There was 30% narrowing in the distal  circumflex artery and irregularities in the mid-vessel.   The right  coronary artery is a moderate-size vessel which gave rise to a  clonus branch, a right ventricular branch, a posterior descending branch,  and two posterolateral branches.  There was 30% narrowing in the proximal  and mid-vessel and irregularities in the proximal mid-vessel.   LEFT VENTRICULOGRAM:  The left ventriculogram performed in the RAO  projection showed good wall motion with no areas of hypokinesis.  The  estimated ejection fraction was 60%.   __________  __________ was performed which showed patent renal arteries and no  significant aortoiliac obstruction.   HEMODYNAMIC DATA:  The right atrial pressure was 11 mean.  The right  ventricular pressure was 35/11.  The pulmonary artery pressure was 35/13,  with a mean of 25.  Pulmonary wedge pressure was 18 mean.  Left ventricular  pressure was 140/21.  The aortic pressure was 140/77, with a mean of 107.  The cardiac output/cardiac index by Fick was 4.6/2.5 L/minute/sq. m.   CONCLUSION:  1.  Mild nonobstructive coronary artery disease with irregularities in the      left anterior descending, 30% narrowing in the distal segments, 30%  proximal and 30% mid stenosis in the right coronary artery with normal      left ventricular function.  2.  Moderate elevation of pulmonary artery wedge and pulmonary artery      pressures.   RECOMMENDATIONS:  In view of these findings, I think it is not likely that  the patient's chest pain is ischemic in etiology, and I think that most of  her symptoms are likely pulmonary.  She does have a mild elevation of  pulmonary wedge pressure which could be related to LVH from hypertensive  disease, and she may have some element of diastolic dysfunction.  Will plan  pulmonary consultation.  She does have a long history of smoking, and I  suspect that this is the major cause for her symptoms.  Will plan to get a 2-  D echocardiogram to evaluate her for diastolic function as an outpatient  later.            ______________________________  Eustace Quail, M.D. [facility]     BB/MEDQ  D:  01/12/2005  T:  01/12/2005  Job:  [id number]   cc:   Lisbeth Ply, M.D.  [place], [place]   Dola Argyle, M.D.  [address] [address]   Cardiopulmonary lab

## 2010-09-19 NOTE — Procedures (Signed)
NAME:  Kayla [name], Kayla [name] NO.:  [id number]   MEDICAL RECORD NO.:  [id number]          PATIENT TYPE:  OUT   LOCATION:  SLEEP CENTER                 FACILITY[facility]  [facility]   PHYSICIAN:  Chesley Mires, M.D.      DATE OF BIRTH:  January 19, 1941   DATE OF STUDY:  02/08/2005                              NOCTURNAL POLYSOMNOGRAM   SITE OF STUDY:  [place].   INDICATION FOR STUDY:  This is a patient, who had undergone right heart  catheterization and was found to have  elevated pulmonary pressures and  symptoms suggestive of obstructive sleep apnea, coming in for an overnight  diagnostic polysomnogram to evaluate for obstructive sleep apnea.   EPWORTH SLEEPINESS SCORE:  Six.   MEDICATIONS:  Metformin, Avandia, Tricor, metoprolol, Nexium, Lantus,  Lyrica, and hydrochlorothiazide.   SLEEP ARCHITECTURE:  Recording time was 370.5 minutes.  Total sleep time was  286.5 minutes.  Sleep efficiency was 77%.  The patient was observed in all  stages of sleep; however, she slept extensively in the non-supine position.  Sleep latency was 14 minutes.  REM latency was 124.5 minutes.   RESPIRATORY DATA:  Snoring was noted throughout the study.  The apnea-  hypopnea index overall was 9.8 with an oxygen saturation nadir of 80%.  There was a significant REM effect with a REM apnea-hypopnea index of 28.5  and a non-REM apnea-hypopnea index of 3.6, and again the patient was not  observed in the supine position.   OXYGEN DATA:  The mean SaO2 during sleep was 94% with an oxygen saturation  nadir of 80%, and she has met 14.9 minutes of study with an oxygen  saturation nadir of below 91%.   CARDIAC DATA:  A normal sinus rhythm with a sinus bradycardia.  Heart rate  ranged from 41 to 113 during wakefulness, 59 to 87 during non-REM sleep, and  58 to 90 during REM sleep.   MOVEMENT-PARASOMNIA:  The periodic limb movement index was 80.1, and the  periodic limb movement index with arousal was 21.2.   IMPRESSION-RECOMMENDATIONS:  This overnight polysomnogram shows evidence for  mild to moderate obstructive sleep apnea as demonstrated by an apnea-  hypopnea index of 9.8 with an oxygen saturation nadir of 80%.  A significant  REM effect is noted with the REM apnea-hypopnea index of 28.5.  Of note is  that the patient was not observed in the supine position at all during the  study.  Therefore, the severity of her episodes of sleep apnea may be  underestimated.  Additionally, her periodic limb movement index was elevated  at 80.8.  Clinical correlation would be necessary as well as further  evaluation after treatment for her obstructive sleep apnea.  The patient  should be counseled in regards to diet, exercise, and weight reduction as  well as the avoidance of alcohol and sedations.  This  actually should be discussed with the patient.  Treatment options for the  patient would include CPAP therapy, oral appliance, and various surgical  procedures.      Chesley Mires, M.D.  [place], [facility]  Electronically Signed  VS/MEDQ  D:  02/16/2005 15:51:28  T:  02/16/2005 19:33:46  Job:  [id number]

## 2010-11-07 ENCOUNTER — Other Ambulatory Visit: Payer: Self-pay | Admitting: Neurosurgery

## 2010-11-07 DIAGNOSIS — M542 Cervicalgia: Secondary | ICD-10-CM

## 2010-11-13 ENCOUNTER — Ambulatory Visit
Admission: RE | Admit: 2010-11-13 | Discharge: 2010-11-13 | Disposition: A | Discharge status: Home / Self Care | Payer: Medicare Other | Source: Ambulatory Visit | Attending: Neurosurgery | Admitting: Neurosurgery

## 2010-11-13 DIAGNOSIS — M542 Cervicalgia: Secondary | ICD-10-CM

## 2010-12-18 ENCOUNTER — Other Ambulatory Visit: Payer: Self-pay | Admitting: Neurosurgery

## 2010-12-18 DIAGNOSIS — M4712 Other spondylosis with myelopathy, cervical region: Secondary | ICD-10-CM

## 2010-12-19 ENCOUNTER — Ambulatory Visit
Admission: RE | Admit: 2010-12-19 | Discharge: 2010-12-19 | Disposition: A | Discharge status: Home / Self Care | Payer: Medicare Other | Source: Ambulatory Visit | Attending: Neurosurgery | Admitting: Neurosurgery

## 2010-12-19 DIAGNOSIS — M4712 Other spondylosis with myelopathy, cervical region: Secondary | ICD-10-CM

## 2010-12-19 DIAGNOSIS — R51 Headache: Secondary | ICD-10-CM

## 2010-12-19 MED ORDER — TRIAMCINOLONE ACETONIDE 40 MG/ML IJ SUSP (RADIOLOGY)
60.0000 mg | Freq: Once | INTRAMUSCULAR | Status: AC
  Administered 2010-12-19: 60 mg via EPIDURAL

## 2010-12-19 MED ORDER — IOHEXOL 300 MG/ML  SOLN
1.0000 mL | Freq: Once | INTRAMUSCULAR | Status: AC | PRN
  Administered 2010-12-19: 1 mL via EPIDURAL

## 2011-01-20 ENCOUNTER — Encounter: Payer: Self-pay | Admitting: Pulmonary Disease

## 2011-01-21 ENCOUNTER — Ambulatory Visit (INDEPENDENT_AMBULATORY_CARE_PROVIDER_SITE_OTHER): Payer: Medicare Other | Admitting: Pulmonary Disease

## 2011-01-21 ENCOUNTER — Encounter: Payer: Self-pay | Admitting: Pulmonary Disease

## 2011-01-21 ENCOUNTER — Ambulatory Visit (INDEPENDENT_AMBULATORY_CARE_PROVIDER_SITE_OTHER)
Admission: RE | Admit: 2011-01-21 | Discharge: 2011-01-21 | Disposition: A | Discharge status: Home / Self Care | Payer: Medicare Other | Source: Ambulatory Visit | Attending: Pulmonary Disease | Admitting: Pulmonary Disease

## 2011-01-21 VITALS — BP 124/76 | HR 54 | Temp 98.2°F | Ht 66.0 in | Wt 239.6 lb

## 2011-01-21 DIAGNOSIS — R002 Palpitations: Secondary | ICD-10-CM

## 2011-01-21 DIAGNOSIS — J449 Chronic obstructive pulmonary disease, unspecified: Secondary | ICD-10-CM

## 2011-01-21 DIAGNOSIS — G4733 Obstructive sleep apnea (adult) (pediatric): Secondary | ICD-10-CM

## 2011-01-21 DIAGNOSIS — J310 Chronic rhinitis: Secondary | ICD-10-CM

## 2011-01-21 HISTORY — DX: Palpitations: R00.2

## 2011-01-21 NOTE — Assessment & Plan Note (Addendum)
I don't think her current symptoms are related to her COPD.  Will continue her current inhaler regimen.  Will arrange for chest xray to further assess her dyspnea.

## 2011-01-21 NOTE — Patient Instructions (Signed)
Will get report from CPAP machine Chest xray today>>will call with results Schedule follow up appointment with Dr. Ron Parker to discuss dizzy spells and palpitations Follow up in 6 months

## 2011-01-21 NOTE — Assessment & Plan Note (Signed)
She has noticed palpitations associated with episodes of dizziness and shortness of breath.  I have advised her to contact Dr. Ron Parker to have this further evaluated.

## 2011-01-21 NOTE — Assessment & Plan Note (Signed)
She is compliant with therapy and demonstrates benefit.  Will have her DME sent her current download.

## 2011-01-21 NOTE — Assessment & Plan Note (Signed)
Stable.  She is to continue nasonex.

## 2011-01-21 NOTE — Progress Notes (Signed)
Subjective:    Patient ID: Kayla [name], female    DOB: 20-Jun-1940, 70 y.o.   MRN: [id number]  HPI 70 year old with known history of COPD and OSA on CPAP 12 cm.  She has lost 10 lbs since last visit.  She has noticed her leg swelling has improved.  She is not having any trouble with her CPAP.  She has noticed more spells of shortness of breath.  This typically happens when she feels her heart rate skip and then get a hard beat.  She will get dizzy when this happens.  She has tried using albuterol, but this does not help.  She is not having much cough, wheeze, or sputum.  She denies fever, or sinus congestion.  She is going to get her flu shot with her PCP.  Past Medical History  Diagnosis Date  . COPD (chronic obstructive pulmonary disease)   . OSA (obstructive sleep apnea)   . GERD (gastroesophageal reflux disease)   . Diverticula of colon   . CAD (coronary artery disease)   . Hyperlipidemia   . Pulmonary hypertension, secondary   . Leg edema   . Goiter   . Obesity   . Diabetes mellitus, type 2   . Headache   . Hypothyroidism   . Left ventricular hypertrophy     Family History  Problem Relation Age of Onset  . Coronary artery disease      History   Social History  . Marital Status: Married   Occupational History  . retired    Social History Main Topics  . Smoking status: Former Smoker -- 1.5 packs/day    Types: Cigarettes    Quit date: 05/04/1996   Allergies  Allergen Reactions  . Sulfonamide Derivatives Rash    Review of Systems     Objective:   Physical Exam  BP 124/76  Pulse 54  Temp(Src) 98.2 F (36.8 C) (Oral)  Ht 5\' 6"  (1.676 m)  Wt 239 lb 9.6 oz (108.682 kg)  BMI 38.67 kg/m2  SpO2 98%  General: obese.  Nose: clear nasal discharge.  Mouth: no oral lesion  Neck: healed scar is present. no JVD.  Lungs: decreased breath sounds, no wheezing or rales.  Heart: regular rate and rhythm, S1, S2 without murmurs, rubs, gallops, or clicks    Extremities: no edema  Neurologic: normal CN II-XII.  Cervical Nodes: no significant adenopathy       Assessment & Plan:   COPD I don't think her current symptoms are related to her COPD.  Will continue her current inhaler regimen.  Will arrange for chest xray to further assess her dyspnea.  OBSTRUCTIVE SLEEP APNEA She is compliant with therapy and demonstrates benefit.  Will have her DME sent her current download.  RHINITIS Stable.  She is to continue nasonex.  Palpitations She has noticed palpitations associated with episodes of dizziness and shortness of breath.  I have advised her to contact Dr. Ron Parker to have this further evaluated.    Updated Medication List Outpatient Encounter Prescriptions as of 01/21/2011  Medication Sig Dispense Refill  . albuterol (PROVENTIL HFA;VENTOLIN HFA) 108 (90 BASE) MCG/ACT inhaler Inhale 2 puffs into the lungs every 4 (four) hours as needed.        Marland Kitchen aspirin 81 MG tablet Take 81 mg by mouth daily.        . budesonide-formoterol (SYMBICORT) 160-4.5 MCG/ACT inhaler Inhale 2 puffs into the lungs 2 (two) times daily.        Marland Kitchen  divalproex (DEPAKOTE) 500 MG 24 hr tablet Take 500 mg by mouth daily.        . fenofibrate (TRICOR) 145 MG tablet Take 145 mg by mouth daily.        . furosemide (LASIX) 40 MG tablet Take 40 mg by mouth daily as needed.        Marland Kitchen HYDROcodone-acetaminophen (LORTAB) 7.5-500 MG per tablet As needed      . insulin glargine (LANTUS) 100 UNIT/ML injection Inject 60 Units into the skin at bedtime.       Marland Kitchen levothyroxine (SYNTHROID, LEVOTHROID) 175 MCG tablet 1 tablet every day      . metoprolol (LOPRESSOR) 50 MG tablet 1/2 Tablet twice a day       . mometasone (NASONEX) 50 MCG/ACT nasal spray Place 2 sprays into the nose daily as needed.       . Pioglitazone HCl-Metformin HCl (ACTOPLUS MET XR) 15-1000 MG TB24 Take 1 tablet by mouth 2 (two) times daily.        . valsartan-hydrochlorothiazide (DIOVAN-HCT) 160-12.5 MG per tablet Take 1  tablet by mouth daily.        Marland Kitchen DISCONTD: levothyroxine (SYNTHROID, LEVOTHROID) 200 MCG tablet 1 capsule every other day

## 2011-01-22 ENCOUNTER — Telehealth: Payer: Self-pay | Admitting: Pulmonary Disease

## 2011-01-22 NOTE — Telephone Encounter (Signed)
CHEST - 2 VIEW 01/21/11: Comparison: 11/12/2008  Findings: Mild cardiomegaly. Lungs are hyperaerated and clear. No pneumothorax and no pleural effusion. No mass or consolidation.  IMPRESSION:  Cardiomegaly without edema. Chronic change.  CXR reviewed and no acute findings.  Will have my nurse inform patient that chest xray showed chronic changes from COPD, but no new findings compared to chest xray done in 2010.  No change to current plan as discussed at visit on 01/21/11.

## 2011-01-22 NOTE — Telephone Encounter (Signed)
I spoke with patient about results and he verbalized understanding and had no questions 

## 2011-02-02 LAB — GLUCOSE, CAPILLARY
Glucose-Capillary: 115 — ABNORMAL HIGH
Glucose-Capillary: 125 — ABNORMAL HIGH

## 2011-02-04 LAB — GLUCOSE, CAPILLARY
Glucose-Capillary: 125 — ABNORMAL HIGH
Glucose-Capillary: 171 — ABNORMAL HIGH
Glucose-Capillary: 76

## 2011-02-06 LAB — GLUCOSE, CAPILLARY
Glucose-Capillary: 128 mg/dL — ABNORMAL HIGH (ref 70–99)
Glucose-Capillary: 156 mg/dL — ABNORMAL HIGH (ref 70–99)
Glucose-Capillary: 171 mg/dL — ABNORMAL HIGH (ref 70–99)
Glucose-Capillary: 173 mg/dL — ABNORMAL HIGH (ref 70–99)
Glucose-Capillary: 179 mg/dL — ABNORMAL HIGH (ref 70–99)
Glucose-Capillary: 197 mg/dL — ABNORMAL HIGH (ref 70–99)
Glucose-Capillary: 90 mg/dL (ref 70–99)
Glucose-Capillary: 95 mg/dL (ref 70–99)

## 2011-02-06 LAB — CALCIUM
Calcium: 8.6 mg/dL (ref 8.4–10.5)
Calcium: 8.7 mg/dL (ref 8.4–10.5)

## 2011-02-06 LAB — DIFFERENTIAL
Basophils Absolute: 0.1 10*3/uL (ref 0.0–0.1)
Basophils Relative: 1 % (ref 0–1)
Eosinophils Absolute: 0.2 10*3/uL (ref 0.0–0.7)
Eosinophils Relative: 2 % (ref 0–5)
Lymphocytes Relative: 22 % (ref 12–46)
Lymphs Abs: 2 10*3/uL (ref 0.7–4.0)
Monocytes Absolute: 0.6 10*3/uL (ref 0.1–1.0)
Monocytes Relative: 7 % (ref 3–12)
Neutro Abs: 6.4 10*3/uL (ref 1.7–7.7)
Neutrophils Relative %: 69 % (ref 43–77)

## 2011-02-06 LAB — BASIC METABOLIC PANEL
BUN: 24 mg/dL — ABNORMAL HIGH (ref 6–23)
CO2: 27 mEq/L (ref 19–32)
Calcium: 9.5 mg/dL (ref 8.4–10.5)
Chloride: 108 mEq/L (ref 96–112)
Creatinine, Ser: 1.03 mg/dL (ref 0.4–1.2)
GFR calc Af Amer: >60 mL/min (ref >60)
GFR calc non Af Amer: 53 mL/min — ABNORMAL LOW (ref >60)
Glucose, Bld: 137 mg/dL — ABNORMAL HIGH (ref 70–99)
Potassium: 4.3 mEq/L (ref 3.5–5.1)
Sodium: 141 mEq/L (ref 135–145)

## 2011-02-06 LAB — CBC
HCT: 29 % — ABNORMAL LOW (ref 36.0–46.0)
Hemoglobin: 9.8 g/dL — ABNORMAL LOW (ref 12.0–15.0)
MCHC: 33.6 g/dL (ref 30.0–36.0)
MCV: 84.4 fL (ref 78.0–100.0)
Platelets: 468 10*3/uL — ABNORMAL HIGH (ref 150–400)
RBC: 3.44 MIL/uL — ABNORMAL LOW (ref 3.87–5.11)
RDW: 15 % (ref 11.5–15.5)
WBC: 9.3 10*3/uL (ref 4.0–10.5)

## 2011-02-06 LAB — TYPE AND SCREEN
ABO/RH(D): B POS
Antibody Screen: NEGATIVE

## 2011-02-06 LAB — ABO/RH: ABO/RH(D): B POS

## 2011-02-11 ENCOUNTER — Telehealth: Payer: Self-pay | Admitting: Pulmonary Disease

## 2011-02-11 MED ORDER — BUDESONIDE-FORMOTEROL FUMARATE 160-4.5 MCG/ACT IN AERO: 2.0000 | INHALATION_SPRAY | Freq: Two times a day (BID) | RESPIRATORY_TRACT | Status: DC

## 2011-02-11 NOTE — Telephone Encounter (Signed)
I spoke with pt ands he states she needs her symbicort sent to [facility] 90 day supply. I advised pt will send rx and nothing further was needed

## 2011-02-18 ENCOUNTER — Telehealth: Payer: Self-pay | Admitting: Pulmonary Disease

## 2011-02-18 DIAGNOSIS — G4733 Obstructive sleep apnea (adult) (pediatric): Secondary | ICD-10-CM

## 2011-02-18 NOTE — Telephone Encounter (Signed)
Auto-CPAP 02/03/11 to 02/08/11>>Used on 6 of 6 nights with average 6 hrs 49 min.  Optimal pressure 6 to 8 cm H2O with average AHI 1.6.  Will have my nurse inform patient that CPAP report looked good.  No change to current set up needed.

## 2011-02-19 NOTE — Telephone Encounter (Signed)
I spoke with patient about results and he verbalized understanding and had no questions 

## 2011-05-01 ENCOUNTER — Telehealth: Payer: Self-pay | Admitting: Cardiology

## 2011-05-01 NOTE — Telephone Encounter (Signed)
Per Dr Percival Spanish.  Pt needs an appt with a PA or NP next week.  Appt scheduled with Truitt Merle.  Pt was notified.

## 2011-05-01 NOTE — Telephone Encounter (Signed)
Pt complaining of severe dizzy spells x 1-2 weeks, lasting about 1-2 minutes.  They are relieved by her laying down.  She is unable to associate any activity with the onset of the dizzy spells.  Only other symptom is occas nausea.  She states she was treated for an inner ear problem without relief.

## 2011-05-01 NOTE — Telephone Encounter (Signed)
Routing to Laurena Spies, RN for Dr. Ron Parker.

## 2011-05-01 NOTE — Telephone Encounter (Signed)
New Problem:   Patient has been having severe dizzy spells and believes that it may be due to a partial carotid artery in her neck and she would like some advise.

## 2011-05-04 ENCOUNTER — Encounter: Payer: Self-pay | Admitting: Nurse Practitioner

## 2011-05-04 ENCOUNTER — Ambulatory Visit (INDEPENDENT_AMBULATORY_CARE_PROVIDER_SITE_OTHER): Payer: Medicare Other | Admitting: Nurse Practitioner

## 2011-05-04 VITALS — BP 132/70 | HR 52 | Ht 66.0 in | Wt 238.6 lb

## 2011-05-04 DIAGNOSIS — R002 Palpitations: Secondary | ICD-10-CM

## 2011-05-04 DIAGNOSIS — R42 Dizziness and giddiness: Secondary | ICD-10-CM

## 2011-05-04 DIAGNOSIS — I2510 Atherosclerotic heart disease of native coronary artery without angina pectoris: Secondary | ICD-10-CM

## 2011-05-04 HISTORY — DX: Dizziness and giddiness: R42

## 2011-05-04 MED ORDER — NITROGLYCERIN 0.4 MG SL SUBL: 0.4000 mg | SUBLINGUAL_TABLET | SUBLINGUAL | Status: DC | PRN

## 2011-05-04 NOTE — Assessment & Plan Note (Signed)
Her spells seem better with her holding her Actoplus. I have asked her to monitor her blood sugars more closely. We will update her carotid doppler and her stress test. I will have her see Dr. Ron Parker in follow up. Patient is agreeable to this plan and will call if any problems develop in the interim.

## 2011-05-04 NOTE — Assessment & Plan Note (Signed)
Currently not having palpitations.

## 2011-05-04 NOTE — Assessment & Plan Note (Signed)
No recent stress testing. Reports long standing history of exertional chest tightness. We will update the stress test. She does have multiple risk factors.

## 2011-05-04 NOTE — Progress Notes (Signed)
Kayla [name] Date of Birth: April 12, 1941 Medical Record [id number]  History of Present Illness: [name] is seen today for a work in visit. She is seen for Dr. Ron Parker. I do not see where she has been seen since November of 2009. She has multiple medical issues which include CAD, COPD and diabetes. She is obese.   She is here because of dizzy spells. This has been going on for about 4 weeks. It coincides with starting Actoplus. She does note that her sugars are lower. She has not checked her sugar when she has the dizzy spells. They occur with no pattern. Does not matter if she is up or sitting down. Will last for a few seconds and then passes. She held her Actoplus yesterday and actually felt better. No palpitations reported. Some nausea but no vomiting also reported. She has not had syncope. No recent URI. She says she has known carotid disease and was worried that this may be the culprit.  Current Outpatient Prescriptions on File Prior to Visit  Medication Sig Dispense Refill  . albuterol (PROVENTIL HFA;VENTOLIN HFA) 108 (90 BASE) MCG/ACT inhaler Inhale 2 puffs into the lungs every 4 (four) hours as needed.        Marland Kitchen aspirin 81 MG tablet Take 81 mg by mouth daily.        . budesonide-formoterol (SYMBICORT) 160-4.5 MCG/ACT inhaler Inhale 2 puffs into the lungs 2 (two) times daily.  3 Inhaler  3  . fenofibrate (TRICOR) 145 MG tablet Take 145 mg by mouth daily.        . furosemide (LASIX) 40 MG tablet Take 40 mg by mouth daily as needed.        Marland Kitchen HYDROcodone-acetaminophen (LORTAB) 7.5-500 MG per tablet As needed      . insulin glargine (LANTUS) 100 UNIT/ML injection Inject 60 Units into the skin at bedtime.       Marland Kitchen levothyroxine (SYNTHROID, LEVOTHROID) 175 MCG tablet 1 tablet every day      . metoprolol (LOPRESSOR) 50 MG tablet Take 75 mg by mouth 2 (two) times daily.       . mometasone (NASONEX) 50 MCG/ACT nasal spray Place 2 sprays into the nose daily as needed.       .  valsartan-hydrochlorothiazide (DIOVAN-HCT) 160-12.5 MG per tablet Take 1 tablet by mouth daily.        Marland Kitchen DISCONTD: Pioglitazone HCl-Metformin HCl (ACTOPLUS MET XR) 15-1000 MG TB24 Take 1 tablet by mouth 2 (two) times daily.          Allergies  Allergen Reactions  . Sulfonamide Derivatives Rash    Past Medical History  Diagnosis Date  . COPD (chronic obstructive pulmonary disease)   . OSA (obstructive sleep apnea)   . GERD (gastroesophageal reflux disease)   . Diverticula of colon   . CAD (coronary artery disease)     Mild per remote cath in 2006  . Hyperlipidemia   . Pulmonary hypertension, secondary   . Leg edema   . Goiter   . Obesity   . Diabetes mellitus, type 2   . Headache   . Hypothyroidism   . Left ventricular hypertrophy   . Carotid stenosis     Past Surgical History  Procedure Date  . Thyroidectomy   . Back surgery     History  Smoking status  . Former Smoker -- 1.5 packs/day  . Types: Cigarettes  . Quit date: 05/04/1996  Smokeless tobacco  . Not on file  History  Alcohol Use No    Family History  Problem Relation Age of Onset  . Coronary artery disease      Review of Systems: The review of systems is per the HPI. She also reports a long standing history of chest discomfort. It occurs with walking. She has been losing weight intentionally with [employer].  All other systems were reviewed and are negative.  Physical Exam: BP 132/70  Pulse 52  Ht 5\' 6"  (1.676 m)  Wt 238 lb 9.6 oz (108.228 kg)  BMI 38.51 kg/m2 Patient is very pleasant and in no acute distress. She is morbidly obese. Skin is warm and dry. Color is normal.  HEENT is unremarkable. Normocephalic/atraumatic. PERRL. Sclera are nonicteric. Neck is supple. No masses. No JVD. Lungs are clear. Cardiac exam shows a regular rate and rhythm. Abdomen is obese but soft. Extremities are without edema. Gait and ROM are intact. No gross neurologic deficits noted.  LABORATORY DATA: EKG today  shows sinus bradycardia. No acute changes.  Assessment / Plan:

## 2011-05-04 NOTE — Patient Instructions (Signed)
We are going to arrange for a stress test.   We are going to get your carotids relooked at.  I have sent a prescription for NTG to the drug store. Use your NTG under your tongue for recurrent chest pain. May take one tablet every 5 minutes. If you are still having discomfort after 3 tablets in 15 minutes, call 911.  I will have you see Dr. Ron Parker in about 2 weeks.  Call the [facility] office at [phone] if you have any questions, problems or concerns.

## 2011-05-11 ENCOUNTER — Ambulatory Visit (HOSPITAL_COMMUNITY): Payer: Medicare Other | Attending: Cardiology | Admitting: Radiology

## 2011-05-11 VITALS — BP 176/70 | Ht 66.0 in | Wt 240.0 lb

## 2011-05-11 DIAGNOSIS — R0609 Other forms of dyspnea: Secondary | ICD-10-CM | POA: Diagnosis not present

## 2011-05-11 DIAGNOSIS — E119 Type 2 diabetes mellitus without complications: Secondary | ICD-10-CM | POA: Diagnosis not present

## 2011-05-11 DIAGNOSIS — E785 Hyperlipidemia, unspecified: Secondary | ICD-10-CM | POA: Diagnosis not present

## 2011-05-11 DIAGNOSIS — J449 Chronic obstructive pulmonary disease, unspecified: Secondary | ICD-10-CM | POA: Insufficient documentation

## 2011-05-11 DIAGNOSIS — R5383 Other fatigue: Secondary | ICD-10-CM | POA: Insufficient documentation

## 2011-05-11 DIAGNOSIS — R0989 Other specified symptoms and signs involving the circulatory and respiratory systems: Secondary | ICD-10-CM | POA: Insufficient documentation

## 2011-05-11 DIAGNOSIS — R42 Dizziness and giddiness: Secondary | ICD-10-CM | POA: Diagnosis not present

## 2011-05-11 DIAGNOSIS — R110 Nausea: Secondary | ICD-10-CM | POA: Insufficient documentation

## 2011-05-11 DIAGNOSIS — J4489 Other specified chronic obstructive pulmonary disease: Secondary | ICD-10-CM | POA: Insufficient documentation

## 2011-05-11 DIAGNOSIS — Z87891 Personal history of nicotine dependence: Secondary | ICD-10-CM | POA: Insufficient documentation

## 2011-05-11 DIAGNOSIS — R079 Chest pain, unspecified: Secondary | ICD-10-CM | POA: Diagnosis not present

## 2011-05-11 DIAGNOSIS — Z794 Long term (current) use of insulin: Secondary | ICD-10-CM | POA: Insufficient documentation

## 2011-05-11 DIAGNOSIS — I2510 Atherosclerotic heart disease of native coronary artery without angina pectoris: Secondary | ICD-10-CM | POA: Insufficient documentation

## 2011-05-11 DIAGNOSIS — I10 Essential (primary) hypertension: Secondary | ICD-10-CM | POA: Insufficient documentation

## 2011-05-11 DIAGNOSIS — R000 Tachycardia, unspecified: Secondary | ICD-10-CM | POA: Diagnosis not present

## 2011-05-11 DIAGNOSIS — Z8249 Family history of ischemic heart disease and other diseases of the circulatory system: Secondary | ICD-10-CM | POA: Diagnosis not present

## 2011-05-11 DIAGNOSIS — I779 Disorder of arteries and arterioles, unspecified: Secondary | ICD-10-CM | POA: Insufficient documentation

## 2011-05-11 DIAGNOSIS — R0789 Other chest pain: Secondary | ICD-10-CM | POA: Diagnosis not present

## 2011-05-11 DIAGNOSIS — R55 Syncope and collapse: Secondary | ICD-10-CM | POA: Diagnosis not present

## 2011-05-11 DIAGNOSIS — R5381 Other malaise: Secondary | ICD-10-CM | POA: Diagnosis not present

## 2011-05-11 DIAGNOSIS — R0602 Shortness of breath: Secondary | ICD-10-CM

## 2011-05-11 MED ORDER — TECHNETIUM TC 99M TETROFOSMIN IV KIT
33.0000 | PACK | Freq: Once | INTRAVENOUS | Status: AC | PRN
  Administered 2011-05-11: 33 via INTRAVENOUS

## 2011-05-11 MED ORDER — REGADENOSON 0.4 MG/5ML IV SOLN
0.4000 mg | Freq: Once | INTRAVENOUS | Status: AC
  Administered 2011-05-11: 0.4 mg via INTRAVENOUS

## 2011-05-11 NOTE — Progress Notes (Signed)
[place] [address] [phone]  Cardiology Nuclear Med Study  Kayla [name] is a 71 y.o. female [id number] 1940/09/19   Nuclear Med Background Indication for Stress Test:  Evaluation for Ischemia History:  COPD,10 Echo:EF=60-65% and mild LVH, 06 Heart Catheterization:mild non obstructive disease; EF=60% and10 Myocardial Perfusion Study:EF=64% Cardiac Risk Factors: Carotid Disease:LICA: [id number], Family History - CAD, History of Smoking, Hypertension, IDDM Type 2 and Lipids  Symptoms:  Chest Pain and Chest Tightness(last date of chest discomfort yesterday), Dizziness, DOE, Fatigue, Nausea, Near Syncope and Rapid HR   Nuclear Pre-Procedure Caffeine/Decaff Intake:  None NPO After: 10:30pm   Lungs:  clear IV 0.9% NS with Angio Cath:  22g  IV Site: R Hand  IV Started by:  Matilde Haymaker, RN  Chest Size (in):  40 Cup Size: C  Height: 5\' 6"  (1.676 m)  Weight:  240 lb (108.863 kg)  BMI:  Body mass index is 38.74 kg/(m^2). Tech Comments:  Metoprolol held x 12 hrs. BS this am 118 with no diabetic meds    Nuclear Med Study 1 or 2 day study: 2 day  Stress Test Type:  Treadmill/Lexiscan  Reading MD: Kirk Ruths, MD  Order Authorizing Provider:  Darletta Moll  Resting Radionuclide: Technetium 55m Tetrofosmin  Resting Radionuclide Dose: 33 mCi  05/11/11  Stress Radionuclide:  Technetium 79m Tetrofosmin  Stress Radionuclide Dose: 33 mCi  05/13/11          Stress Protocol Rest HR: 57 Stress HR: 71  Rest BP: 176/70 Stress BP: 189/66  Exercise Time (min): n/a METS: n/a   Predicted Max HR: 150 bpm % Max HR: 47.33 bpm Rate Pressure Product: 13419   Dose of Adenosine (mg):  n/a Dose of Lexiscan: 0.4 mg  Dose of Atropine (mg): n/a Dose of Dobutamine: n/a mcg/kg/min (at max HR)  Stress Test Technologist: Matilde Haymaker, RN  Nuclear Technologist:  Charlton Amor, CNMT     Rest Procedure:  Myocardial  perfusion imaging was performed at rest 45 minutes following the intravenous administration of Technetium 8m Tetrofosmin. Rest ECG: Sinus Bradycardia with rare PVC  Stress Procedure:  The patient received IV Lexiscan 0.4 mg over 15-seconds.  Technetium 7m Tetrofosmin injected at 30-seconds. Patient had rare PVC and PAC.  There were no significant changes with Lexiscan.  Quantitative spect images were obtained after a 45 minute delay. Stress ECG: No significant change from baseline ECG  QPS Raw Data Images:  Normal; no motion artifact; normal heart/lung ratio. Stress Images:  Small, mild mid anteroseptal perfusion defect.  Mild basal to mid inferior perfusion defect.  Rest Images:  Small, mild mid anteroseptal perfusion defect.  Mild basal to mid inferior perfusion defect.  Subtraction (SDS):  Mild, fixed mid anteroseptal perfusion defect and mild fixed basal to mid inferior perfusion defect.  Transient Ischemic Dilatation (Normal <1.22):  1,01 Lung/Heart Ratio (Normal <0.45):  .40  Quantitative Gated Spect Images QGS EDV:  128 ml QGS ESV:  46 ml QGS cine images:  NL LV Function; NL Wall Motion QGS EF: 64%  Impression Exercise Capacity:  Lexiscan with no exercise. BP Response:  Hypertensive blood pressure response. Clinical Symptoms:  Short of breath.  ECG Impression:  No significant ST segment change suggestive of ischemia. Comparison with Prior Nuclear Study: No significant change from previous study  Overall Impression:  Low risk stress nuclear study.  Mild fixed mid anteroseptal and basal to mid inferior perfusion defects with normal wall  motion.  Similar pattern on prior myoview.  Suspect this represents attenuation.  I do not think that this represents ischemia or infarction.   Kayla [name]

## 2011-05-13 ENCOUNTER — Ambulatory Visit (HOSPITAL_COMMUNITY): Payer: Medicare Other | Attending: Cardiology | Admitting: Radiology

## 2011-05-13 ENCOUNTER — Encounter (INDEPENDENT_AMBULATORY_CARE_PROVIDER_SITE_OTHER): Payer: Medicare Other | Admitting: Cardiology

## 2011-05-13 DIAGNOSIS — I6529 Occlusion and stenosis of unspecified carotid artery: Secondary | ICD-10-CM

## 2011-05-13 DIAGNOSIS — I2510 Atherosclerotic heart disease of native coronary artery without angina pectoris: Secondary | ICD-10-CM

## 2011-05-13 DIAGNOSIS — R0989 Other specified symptoms and signs involving the circulatory and respiratory systems: Secondary | ICD-10-CM

## 2011-05-13 DIAGNOSIS — R42 Dizziness and giddiness: Secondary | ICD-10-CM

## 2011-05-13 MED ORDER — TECHNETIUM TC 99M TETROFOSMIN IV KIT
33.0000 | PACK | Freq: Once | INTRAVENOUS | Status: AC | PRN
  Administered 2011-05-13: 33 via INTRAVENOUS

## 2011-05-14 NOTE — Progress Notes (Signed)
Dr. Ron Parker, Here is a myoview for your review. Patient seen for chest pain.  Thanks, Cecille Rubin

## 2011-05-26 DIAGNOSIS — I10 Essential (primary) hypertension: Secondary | ICD-10-CM | POA: Diagnosis not present

## 2011-05-26 DIAGNOSIS — R42 Dizziness and giddiness: Secondary | ICD-10-CM | POA: Diagnosis not present

## 2011-05-26 DIAGNOSIS — N183 Chronic kidney disease, stage 3 unspecified: Secondary | ICD-10-CM | POA: Diagnosis not present

## 2011-05-26 DIAGNOSIS — E559 Vitamin D deficiency, unspecified: Secondary | ICD-10-CM | POA: Diagnosis not present

## 2011-05-26 DIAGNOSIS — E782 Mixed hyperlipidemia: Secondary | ICD-10-CM | POA: Diagnosis not present

## 2011-05-26 DIAGNOSIS — E890 Postprocedural hypothyroidism: Secondary | ICD-10-CM | POA: Diagnosis not present

## 2011-05-26 DIAGNOSIS — D649 Anemia, unspecified: Secondary | ICD-10-CM | POA: Diagnosis not present

## 2011-05-26 DIAGNOSIS — E1149 Type 2 diabetes mellitus with other diabetic neurological complication: Secondary | ICD-10-CM | POA: Diagnosis not present

## 2011-05-28 ENCOUNTER — Telehealth: Payer: Self-pay | Admitting: Cardiology

## 2011-05-28 NOTE — Telephone Encounter (Signed)
Pt wants to know if she needs to keep appt on 06/08/11?  She states she is not having any chest pressure or palpitations.  She still has the dizziness but states she was given the test results and feels maybe she should f/u with her pcp instead for the dizziness.

## 2011-05-28 NOTE — Telephone Encounter (Signed)
New Msg: Pt calling wanting to know if pt needs to keep appt with Dr. Ron Parker on 06/08/11.   If something is wrong as determined/discoverd from pt recent tests and MD would like to discuss this with pt, pt would like to keep appt. If procedures resulted in no negative findings pt wants to know if it is necessary for pt to keep appt. Please return pt call to discuss further.    Alt #: [id number]

## 2011-05-29 ENCOUNTER — Encounter: Payer: Self-pay | Admitting: Cardiology

## 2011-05-29 NOTE — Progress Notes (Unsigned)
The patient was seen for dizziness and overall evaluation. It was decided that a carotid Doppler and nuclear study were needed. The Doppler showed mild disease and was stable. The nuclear scan revealed no significant ischemia. The patient is stable.

## 2011-05-29 NOTE — Telephone Encounter (Signed)
Agree. Pt does not need to see me to f/u at this time

## 2011-05-29 NOTE — Telephone Encounter (Signed)
Pt was notified.  

## 2011-06-08 ENCOUNTER — Ambulatory Visit: Payer: Medicare Other | Admitting: Cardiology

## 2011-06-15 DIAGNOSIS — R51 Headache: Secondary | ICD-10-CM | POA: Diagnosis not present

## 2011-06-15 DIAGNOSIS — I10 Essential (primary) hypertension: Secondary | ICD-10-CM | POA: Diagnosis not present

## 2011-06-15 DIAGNOSIS — R42 Dizziness and giddiness: Secondary | ICD-10-CM | POA: Diagnosis not present

## 2011-06-17 ENCOUNTER — Other Ambulatory Visit: Payer: Self-pay | Admitting: Family Medicine

## 2011-06-17 DIAGNOSIS — R42 Dizziness and giddiness: Secondary | ICD-10-CM

## 2011-06-17 DIAGNOSIS — R51 Headache: Secondary | ICD-10-CM

## 2011-06-18 ENCOUNTER — Other Ambulatory Visit: Payer: Medicare Other

## 2011-06-24 ENCOUNTER — Ambulatory Visit
Admission: RE | Admit: 2011-06-24 | Discharge: 2011-06-24 | Disposition: A | Discharge status: Home / Self Care | Payer: Medicare Other | Source: Ambulatory Visit | Attending: Family Medicine | Admitting: Family Medicine

## 2011-06-24 DIAGNOSIS — R42 Dizziness and giddiness: Secondary | ICD-10-CM

## 2011-06-24 DIAGNOSIS — R51 Headache: Secondary | ICD-10-CM

## 2011-07-03 DIAGNOSIS — M1710 Unilateral primary osteoarthritis, unspecified knee: Secondary | ICD-10-CM | POA: Diagnosis not present

## 2011-07-03 DIAGNOSIS — IMO0002 Reserved for concepts with insufficient information to code with codable children: Secondary | ICD-10-CM | POA: Diagnosis not present

## 2011-07-07 DIAGNOSIS — M1710 Unilateral primary osteoarthritis, unspecified knee: Secondary | ICD-10-CM | POA: Diagnosis not present

## 2011-07-07 DIAGNOSIS — Z01818 Encounter for other preprocedural examination: Secondary | ICD-10-CM | POA: Diagnosis not present

## 2011-07-13 ENCOUNTER — Telehealth: Payer: Self-pay | Admitting: Nurse Practitioner

## 2011-07-13 ENCOUNTER — Telehealth: Payer: Self-pay | Admitting: Pulmonary Disease

## 2011-07-13 NOTE — Telephone Encounter (Signed)
LOV,12 faxed to [facility] @ [phone] 07/13/11/KM

## 2011-07-13 NOTE — Telephone Encounter (Signed)
Noted  

## 2011-07-13 NOTE — Telephone Encounter (Signed)
Called, spoke with pt.  She is scheduled for knee replacement on 07/27/11 with Dr. Paralee Cancel.  She was unsure if he would need surgery clearance from Dr. Halford Chessman and requesting I call Dr. Aurea Graff office to clarify this.    Called Dr. Aurea Graff office, spoke with Santiago Glad who reports Dr. Alvan Dame only needed surgery clearance from PCP and cardiologist on pt.  Does not need clearance from Dr. Halford Chessman.  Pt aware of this and requesting to schedule 6 mo f/u with Dr. Halford Chessman which is scheduled on August 17, 2011 at 9am per pt's request.  Nothing further needed at this time.  Will sign off on phone message and send to Dr. Halford Chessman as Juluis Rainier that pt having surgery.

## 2011-07-16 ENCOUNTER — Encounter (HOSPITAL_COMMUNITY): Payer: Self-pay | Admitting: Pharmacy Technician

## 2011-07-22 ENCOUNTER — Encounter (HOSPITAL_COMMUNITY)
Admission: RE | Admit: 2011-07-22 | Discharge: 2011-07-22 | Disposition: A | Discharge status: Home / Self Care | Payer: Medicare Other | Source: Ambulatory Visit | Attending: Orthopedic Surgery | Admitting: Orthopedic Surgery

## 2011-07-22 ENCOUNTER — Encounter (HOSPITAL_COMMUNITY): Payer: Self-pay

## 2011-07-22 HISTORY — DX: Other complications of anesthesia, initial encounter: T88.59XA

## 2011-07-22 HISTORY — DX: Other specified postprocedural states: Z98.890

## 2011-07-22 HISTORY — DX: Nausea with vomiting, unspecified: R11.2

## 2011-07-22 HISTORY — DX: Adverse effect of unspecified anesthetic, initial encounter: T41.45XA

## 2011-07-22 HISTORY — DX: Unspecified osteoarthritis, unspecified site: M19.90

## 2011-07-22 HISTORY — DX: Dizziness and giddiness: R42

## 2011-07-22 LAB — BASIC METABOLIC PANEL
BUN: 35 mg/dL — ABNORMAL HIGH (ref 6–23)
CO2: 28 mEq/L (ref 19–32)
Calcium: 9.4 mg/dL (ref 8.4–10.5)
Chloride: 102 mEq/L (ref 96–112)
Creatinine, Ser: 1.24 mg/dL — ABNORMAL HIGH (ref 0.50–1.10)
GFR calc Af Amer: 50 mL/min — ABNORMAL LOW (ref >90)
GFR calc non Af Amer: 43 mL/min — ABNORMAL LOW (ref >90)
Glucose, Bld: 280 mg/dL — ABNORMAL HIGH (ref 70–99)
Potassium: 4.2 mEq/L (ref 3.5–5.1)
Sodium: 139 mEq/L (ref 135–145)

## 2011-07-22 LAB — URINE MICROSCOPIC-ADD ON

## 2011-07-22 LAB — URINALYSIS, ROUTINE W REFLEX MICROSCOPIC
Bilirubin Urine: NEGATIVE
Glucose, UA: 100 mg/dL — AB
Hgb urine dipstick: NEGATIVE
Ketones, ur: NEGATIVE mg/dL
Nitrite: NEGATIVE
Protein, ur: NEGATIVE mg/dL
Specific Gravity, Urine: 1.021 (ref 1.005–1.030)
Urobilinogen, UA: 1 mg/dL (ref 0.0–1.0)
pH: 7 (ref 5.0–8.0)

## 2011-07-22 LAB — SURGICAL PCR SCREEN
MRSA, PCR: NEGATIVE
Staphylococcus aureus: NEGATIVE

## 2011-07-22 LAB — PROTIME-INR
INR: 0.93 (ref 0.00–1.49)
Prothrombin Time: 12.7 seconds (ref 11.6–15.2)

## 2011-07-22 LAB — DIFFERENTIAL
Basophils Absolute: 0.1 10*3/uL (ref 0.0–0.1)
Basophils Relative: 1 % (ref 0–1)
Eosinophils Absolute: 0.2 10*3/uL (ref 0.0–0.7)
Eosinophils Relative: 2 % (ref 0–5)
Lymphocytes Relative: 21 % (ref 12–46)
Lymphs Abs: 2 10*3/uL (ref 0.7–4.0)
Monocytes Absolute: 0.7 10*3/uL (ref 0.1–1.0)
Monocytes Relative: 8 % (ref 3–12)
Neutro Abs: 6.3 10*3/uL (ref 1.7–7.7)
Neutrophils Relative %: 68 % (ref 43–77)

## 2011-07-22 LAB — CBC
HCT: 35.1 % — ABNORMAL LOW (ref 36.0–46.0)
Hemoglobin: 11.2 g/dL — ABNORMAL LOW (ref 12.0–15.0)
MCH: 29.4 pg (ref 26.0–34.0)
MCHC: 31.9 g/dL (ref 30.0–36.0)
MCV: 92.1 fL (ref 78.0–100.0)
Platelets: 374 10*3/uL (ref 150–400)
RBC: 3.81 MIL/uL — ABNORMAL LOW (ref 3.87–5.11)
RDW: 13.5 % (ref 11.5–15.5)
WBC: 9.2 10*3/uL (ref 4.0–10.5)

## 2011-07-22 LAB — APTT: aPTT: 26 seconds (ref 24–37)

## 2011-07-22 NOTE — Pre-Procedure Instructions (Signed)
CALLED COURTNEY SYKES AND MADE AWARE GLUCOSE =280 AND URINE WBC=3-6, WILL MAKE DR Dorthey Sawyer

## 2011-07-22 NOTE — H&P (Signed)
NAMEAJANIQUE, BECKSTRAND NO.:  [id number]  MEDICAL RECORD NO.:  [id number]  LOCATION:  [facility]                         FACILITY:  [facility]  PHYSICIAN:  Pietro Cassis. Alvan Dame, M.D.  DATE OF BIRTH:  October 06, 1940  DATE OF ADMISSION:  07/22/2011 DATE OF DISCHARGE:                             HISTORY & PHYSICAL   DATE OF SURGERY:  July 27, 2011  ADMITTING DIAGNOSIS:  End-stage osteoarthritis, left knee.  HISTORY OF PRESENT ILLNESS:  This is a 71 year old lady with a history of end-stage osteoarthritis of her left knee that has failed conservative treatment.  After discussion of treatments, benefits, risks, and options, the patient is now scheduled for total knee arthroplasty of the left knee.  The surgery risks, benefits, and aftercare were discussed in detail with the patient.  Questions invited and answered.  Note that her medical doctor is Dr. Daiva Eves.  Her cardiologist is Dr. Carlena Bjornstad and she had clearance by both.  She plans on going home after the surgery.  She is not a candidate for tranexamic acid or dexamethasone, and will not receive either at surgery, and note that she does have sleep apnea and uses CPAP, and is advised to bring her mask and hose with her to the hospital.  She was given her postop medications of aspirin, iron, MiraLax, Robaxin, and Colace.  PAST MEDICAL HISTORY:  Drug allergy to SULFA with a rash.  CURRENT MEDICATIONS: 1. Ibuprofen p.r.n. 2. Symbicort 160-4.5 mcg 1 inhalation b.i.d. 3. ProAir HFA 90 mcg based inhalation p.r.n. 4. Lantus 100 units at bedtime. 5. Low-dose aspirin 81 mg daily. 6. Simvastatin, dosage not known by the patient, 1 daily. 7. Metoprolol 100 mg b.i.d. 8. TriCor 145 mg daily. 9. Levothyroxine 175 mcg every other day and then 200 mcg every other     day. 10.Divalproex 500 mg 2 q.h.s. 11.Diovan 160 mg 1 daily. 12.Furosemide 40 mg p.r.n. fluid retention.  SERIOUS MEDICAL ILLNESSES:  Include chronic obstructive  pulmonary disease, diabetes, hypertension, hyperlipidemia, migraine headaches, sleep apnea, coronary artery disease, and hypothyroidism.  PREVIOUS SURGERIES:  Include lumbar disk surgery x2 and total thyroidectomy, noncancerous.  FAMILY HISTORY:  Positive for heart failure and heart attack.  SOCIAL HISTORY:  The patient is married.  She lives at home.  She does not smoke, but used to, and does not drink, and again is planning on going home after surgery.  PHYSICAL EXAMINATION:  GENERAL:  Reveals an obese lady in no acute distress. VITAL SIGNS:  Blood pressure is 165/77, pulse is 64 and regular, respirations 16. HEENT:  Head normocephalic.  Nose patent.  Ears patent.  Pupils equal, round, and reactive to light.  Throat without injection. NECK:  Supple without adenopathy.  Carotids 2+ without bruit. CHEST:  Clear to auscultation.  No rales or rhonchi.  Respirations 16. HEART:  Regular rate and rhythm at 64 beats per minute with 1/6 systolic ejection murmur. ABDOMEN:  Soft with active bowel sounds.  No masses or organomegaly. NEUROLOGIC:  The patient alert and oriented to time, place, and person. Cranial nerves 2 through 12 grossly intact. EXTREMITIES:  Shows the left knee with a valgus deformity, full extension with further flexion  to 115 degrees, with pain.  Her right knee shows full extension, just a very slight valgus deformity.  Further flexion to 125 degrees. NEUROVASCULAR:  Status intact.  Dorsalis pedis, posterior tibialis pulses are 2+.  ASSESSMENT:  End-stage osteoarthritis, left knee.  PLAN:  Total knee arthroplasty, left knee.     Judith Part. Tashawn Greff, P.A.   ______________________________ Pietro Cassis Alvan Dame, M.D.    SJC/MEDQ  D:  07/22/2011  T:  07/22/2011  Job:  [id number]

## 2011-07-22 NOTE — Pre-Procedure Instructions (Signed)
Dr Dirk Dress medical clearance note on chart Dr Ron Parker cardiac clearance note on chart 05-13-2011 carotid dopplers in epic 01-02-2009 echo in epi

## 2011-07-22 NOTE — Patient Instructions (Signed)
[place]  07/22/2011   Your procedure is scheduled on:  07-27-2011  Report to [place] at 1000  AM.  Call this number if you have problems the morning of surgery: [phone]   Remember:take 1/2 dose lantus insulin bedtime 07-26-11 (take 50 units), bring CPAP mask only   Do not eat food or drink liquids:After Midnight.  .  Take these medicines the morning of surgery with A SIP OF WATER: nasonex nasal spray if needed, symbicort, albuterol inhaler if needed and bring inhaler, hydrocodone if needed, synthroid, metoprolol   Do not wear jewelry or make up.  Do not wear lotions, powders, or perfumes.Do not wear deodorant.    Do not bring valuables to the hospital.  Contacts, dentures or bridgework may not be worn into surgery.  Leave suitcase in the car. After surgery it may be brought to your room.  For patients admitted to the hospital, checkout time is 11:00 AM the day of discharge.     Special Instructions: Buyer, retail Wash: 1/2 bottle night before surgery and 1/2 bottle morning of surgery.neck down avoid private area, do not shve for 2 days before showers   Please read over the following fact sheets that you were given: MRSA [place] pre op nurse phone number [phone], call if needed

## 2011-07-27 ENCOUNTER — Inpatient Hospital Stay (HOSPITAL_COMMUNITY)
Admission: RE | Admit: 2011-07-27 | Discharge: 2011-07-29 | DRG: 470 | Disposition: A | Discharge status: Home Health | Payer: Medicare Other | Source: Ambulatory Visit | Attending: Orthopedic Surgery | Admitting: Orthopedic Surgery

## 2011-07-27 ENCOUNTER — Ambulatory Visit (HOSPITAL_COMMUNITY): Payer: Medicare Other | Admitting: Anesthesiology

## 2011-07-27 ENCOUNTER — Encounter (HOSPITAL_COMMUNITY): Payer: Self-pay | Admitting: *Deleted

## 2011-07-27 ENCOUNTER — Encounter (HOSPITAL_COMMUNITY): Payer: Self-pay | Admitting: Anesthesiology

## 2011-07-27 ENCOUNTER — Encounter (HOSPITAL_COMMUNITY)
Admission: RE | Disposition: A | Discharge status: Home Health | Payer: Self-pay | Source: Ambulatory Visit | Attending: Orthopedic Surgery

## 2011-07-27 DIAGNOSIS — Z79899 Other long term (current) drug therapy: Secondary | ICD-10-CM | POA: Diagnosis not present

## 2011-07-27 DIAGNOSIS — Z7982 Long term (current) use of aspirin: Secondary | ICD-10-CM

## 2011-07-27 DIAGNOSIS — K219 Gastro-esophageal reflux disease without esophagitis: Secondary | ICD-10-CM | POA: Diagnosis not present

## 2011-07-27 DIAGNOSIS — IMO0002 Reserved for concepts with insufficient information to code with codable children: Principal | ICD-10-CM | POA: Diagnosis present

## 2011-07-27 DIAGNOSIS — J449 Chronic obstructive pulmonary disease, unspecified: Secondary | ICD-10-CM | POA: Diagnosis not present

## 2011-07-27 DIAGNOSIS — E119 Type 2 diabetes mellitus without complications: Secondary | ICD-10-CM | POA: Diagnosis present

## 2011-07-27 DIAGNOSIS — G4730 Sleep apnea, unspecified: Secondary | ICD-10-CM | POA: Diagnosis present

## 2011-07-27 DIAGNOSIS — E785 Hyperlipidemia, unspecified: Secondary | ICD-10-CM | POA: Diagnosis present

## 2011-07-27 DIAGNOSIS — E669 Obesity, unspecified: Secondary | ICD-10-CM | POA: Diagnosis present

## 2011-07-27 DIAGNOSIS — M21069 Valgus deformity, not elsewhere classified, unspecified knee: Secondary | ICD-10-CM | POA: Diagnosis present

## 2011-07-27 DIAGNOSIS — Z96659 Presence of unspecified artificial knee joint: Secondary | ICD-10-CM

## 2011-07-27 DIAGNOSIS — Z6838 Body mass index (BMI) 38.0-38.9, adult: Secondary | ICD-10-CM

## 2011-07-27 DIAGNOSIS — M1710 Unilateral primary osteoarthritis, unspecified knee: Principal | ICD-10-CM | POA: Diagnosis present

## 2011-07-27 DIAGNOSIS — E039 Hypothyroidism, unspecified: Secondary | ICD-10-CM | POA: Diagnosis present

## 2011-07-27 DIAGNOSIS — J4489 Other specified chronic obstructive pulmonary disease: Secondary | ICD-10-CM | POA: Diagnosis present

## 2011-07-27 DIAGNOSIS — G43909 Migraine, unspecified, not intractable, without status migrainosus: Secondary | ICD-10-CM | POA: Diagnosis present

## 2011-07-27 DIAGNOSIS — I2510 Atherosclerotic heart disease of native coronary artery without angina pectoris: Secondary | ICD-10-CM | POA: Diagnosis not present

## 2011-07-27 DIAGNOSIS — Z01812 Encounter for preprocedural laboratory examination: Secondary | ICD-10-CM | POA: Diagnosis not present

## 2011-07-27 DIAGNOSIS — Z791 Long term (current) use of non-steroidal anti-inflammatories (NSAID): Secondary | ICD-10-CM | POA: Diagnosis not present

## 2011-07-27 DIAGNOSIS — IMO0001 Reserved for inherently not codable concepts without codable children: Secondary | ICD-10-CM | POA: Diagnosis not present

## 2011-07-27 DIAGNOSIS — Z794 Long term (current) use of insulin: Secondary | ICD-10-CM | POA: Diagnosis not present

## 2011-07-27 DIAGNOSIS — I10 Essential (primary) hypertension: Secondary | ICD-10-CM | POA: Diagnosis present

## 2011-07-27 HISTORY — PX: TOTAL KNEE ARTHROPLASTY: SHX125

## 2011-07-27 HISTORY — DX: Presence of unspecified artificial knee joint: Z96.659

## 2011-07-27 LAB — TYPE AND SCREEN
ABO/RH(D): B POS
Antibody Screen: NEGATIVE

## 2011-07-27 LAB — GLUCOSE, CAPILLARY
Glucose-Capillary: 145 mg/dL — ABNORMAL HIGH (ref 70–99)
Glucose-Capillary: 120 mg/dL — ABNORMAL HIGH (ref 70–99)
Glucose-Capillary: 81 mg/dL (ref 70–99)

## 2011-07-27 LAB — ABO/RH: ABO/RH(D): B POS

## 2011-07-27 SURGERY — ARTHROPLASTY, KNEE, TOTAL
Anesthesia: Spinal | Site: Knee | Laterality: Left | Wound class: Clean

## 2011-07-27 MED ORDER — METOPROLOL TARTRATE 100 MG PO TABS
100.0000 mg | ORAL_TABLET | Freq: Two times a day (BID) | ORAL | Status: DC
  Administered 2011-07-27 – 2011-07-29 (×3): 100 mg via ORAL
  Filled 2011-07-27 (×5): qty 1

## 2011-07-27 MED ORDER — VALSARTAN 160 MG PO TABS
160.0000 mg | ORAL_TABLET | Freq: Every day | ORAL | Status: DC
  Administered 2011-07-28 – 2011-07-29 (×2): 160 mg via ORAL
  Filled 2011-07-27 (×2): qty 1

## 2011-07-27 MED ORDER — BUPIVACAINE IN DEXTROSE 0.75-8.25 % IT SOLN
INTRATHECAL | Status: DC | PRN
  Administered 2011-07-27: 2 mL via INTRATHECAL

## 2011-07-27 MED ORDER — ONDANSETRON HCL 4 MG PO TABS: 4.0000 mg | ORAL_TABLET | Freq: Four times a day (QID) | ORAL | Status: DC | PRN

## 2011-07-27 MED ORDER — PROMETHAZINE HCL 25 MG/ML IJ SOLN: 6.2500 mg | INTRAMUSCULAR | Status: DC | PRN

## 2011-07-27 MED ORDER — METOCLOPRAMIDE HCL 5 MG/ML IJ SOLN
5.0000 mg | Freq: Three times a day (TID) | INTRAMUSCULAR | Status: DC | PRN
  Administered 2011-07-28: 10 mg via INTRAVENOUS
  Filled 2011-07-27: qty 2

## 2011-07-27 MED ORDER — VALSARTAN-HYDROCHLOROTHIAZIDE 160-12.5 MG PO TABS: 1.0000 | ORAL_TABLET | ORAL | Status: DC

## 2011-07-27 MED ORDER — ALBUTEROL SULFATE HFA 108 (90 BASE) MCG/ACT IN AERS
2.0000 | INHALATION_SPRAY | RESPIRATORY_TRACT | Status: DC | PRN
  Filled 2011-07-27: qty 6.7

## 2011-07-27 MED ORDER — FUROSEMIDE 40 MG PO TABS
40.0000 mg | ORAL_TABLET | Freq: Every day | ORAL | Status: DC | PRN
  Filled 2011-07-27: qty 1

## 2011-07-27 MED ORDER — POLYETHYLENE GLYCOL 3350 17 G PO PACK
17.0000 g | PACK | Freq: Two times a day (BID) | ORAL | Status: DC
  Administered 2011-07-27 – 2011-07-28 (×3): 17 g via ORAL
  Filled 2011-07-27 (×4): qty 1

## 2011-07-27 MED ORDER — ACETAMINOPHEN 650 MG RE SUPP: 650.0000 mg | Freq: Four times a day (QID) | RECTAL | Status: DC | PRN

## 2011-07-27 MED ORDER — OXYCODONE HCL 5 MG PO TABS
5.0000 mg | ORAL_TABLET | ORAL | Status: DC
  Administered 2011-07-27: 5 mg via ORAL
  Administered 2011-07-28 – 2011-07-29 (×8): 10 mg via ORAL
  Filled 2011-07-27: qty 2
  Filled 2011-07-27: qty 3
  Filled 2011-07-27 (×7): qty 2

## 2011-07-27 MED ORDER — CEFAZOLIN SODIUM 1-5 GM-% IV SOLN
1.0000 g | Freq: Four times a day (QID) | INTRAVENOUS | Status: AC
  Administered 2011-07-27 – 2011-07-28 (×3): 1 g via INTRAVENOUS
  Filled 2011-07-27 (×3): qty 50

## 2011-07-27 MED ORDER — KETOROLAC TROMETHAMINE 15 MG/ML IJ SOLN
15.0000 mg | Freq: Four times a day (QID) | INTRAMUSCULAR | Status: DC
  Administered 2011-07-27 – 2011-07-29 (×7): 15 mg via INTRAVENOUS
  Filled 2011-07-27 (×7): qty 1

## 2011-07-27 MED ORDER — ONDANSETRON HCL 4 MG/2ML IJ SOLN
INTRAMUSCULAR | Status: DC | PRN
  Administered 2011-07-27: 4 mg via INTRAVENOUS

## 2011-07-27 MED ORDER — HYDROCHLOROTHIAZIDE 12.5 MG PO CAPS
12.5000 mg | ORAL_CAPSULE | Freq: Every day | ORAL | Status: DC
  Administered 2011-07-28 – 2011-07-29 (×2): 12.5 mg via ORAL
  Filled 2011-07-27 (×2): qty 1

## 2011-07-27 MED ORDER — ACETAMINOPHEN 10 MG/ML IV SOLN
INTRAVENOUS | Status: AC
  Filled 2011-07-27: qty 100

## 2011-07-27 MED ORDER — MENTHOL 3 MG MT LOZG
1.0000 | LOZENGE | OROMUCOSAL | Status: DC | PRN
  Filled 2011-07-27: qty 9

## 2011-07-27 MED ORDER — LACTATED RINGERS IV SOLN
INTRAVENOUS | Status: DC
  Administered 2011-07-27: 1000 mL via INTRAVENOUS
  Administered 2011-07-27: 15:00:00 via INTRAVENOUS

## 2011-07-27 MED ORDER — METHOCARBAMOL 500 MG PO TABS: 500.0000 mg | ORAL_TABLET | Freq: Four times a day (QID) | ORAL | Status: DC | PRN

## 2011-07-27 MED ORDER — METOCLOPRAMIDE HCL 10 MG PO TABS: 5.0000 mg | ORAL_TABLET | Freq: Three times a day (TID) | ORAL | Status: DC | PRN

## 2011-07-27 MED ORDER — DOCUSATE SODIUM 100 MG PO CAPS
100.0000 mg | ORAL_CAPSULE | Freq: Two times a day (BID) | ORAL | Status: DC
  Administered 2011-07-27 – 2011-07-29 (×4): 100 mg via ORAL
  Filled 2011-07-27 (×4): qty 1

## 2011-07-27 MED ORDER — KETOROLAC TROMETHAMINE 30 MG/ML IJ SOLN
INTRAMUSCULAR | Status: DC | PRN
  Administered 2011-07-27: 30 mg

## 2011-07-27 MED ORDER — ALUM & MAG HYDROXIDE-SIMETH 200-200-20 MG/5ML PO SUSP: 30.0000 mL | ORAL | Status: DC | PRN

## 2011-07-27 MED ORDER — DIPHENHYDRAMINE HCL 25 MG PO CAPS
25.0000 mg | ORAL_CAPSULE | Freq: Four times a day (QID) | ORAL | Status: DC | PRN
  Administered 2011-07-29: 25 mg via ORAL
  Filled 2011-07-27: qty 1

## 2011-07-27 MED ORDER — ZOLPIDEM TARTRATE 5 MG PO TABS: 5.0000 mg | ORAL_TABLET | Freq: Every evening | ORAL | Status: DC | PRN

## 2011-07-27 MED ORDER — FLEET ENEMA 7-19 GM/118ML RE ENEM: 1.0000 | ENEMA | Freq: Once | RECTAL | Status: AC | PRN

## 2011-07-27 MED ORDER — ACETAMINOPHEN 10 MG/ML IV SOLN
INTRAVENOUS | Status: DC | PRN
  Administered 2011-07-27: 1000 mg via INTRAVENOUS

## 2011-07-27 MED ORDER — BUPIVACAINE-EPINEPHRINE PF 0.25-1:200000 % IJ SOLN
INTRAMUSCULAR | Status: DC | PRN
  Administered 2011-07-27: 50 mL

## 2011-07-27 MED ORDER — BUPIVACAINE-EPINEPHRINE 0.25% -1:200000 IJ SOLN
INTRAMUSCULAR | Status: AC
  Filled 2011-07-27: qty 1

## 2011-07-27 MED ORDER — CEFAZOLIN SODIUM-DEXTROSE 2-3 GM-% IV SOLR
2.0000 g | Freq: Once | INTRAVENOUS | Status: AC
  Administered 2011-07-27: 2 g via INTRAVENOUS

## 2011-07-27 MED ORDER — INSULIN ASPART 100 UNIT/ML ~~LOC~~ SOLN
0.0000 [IU] | Freq: Three times a day (TID) | SUBCUTANEOUS | Status: DC
  Administered 2011-07-28 – 2011-07-29 (×2): 2 [IU] via SUBCUTANEOUS

## 2011-07-27 MED ORDER — ONDANSETRON HCL 4 MG/2ML IJ SOLN
4.0000 mg | Freq: Four times a day (QID) | INTRAMUSCULAR | Status: DC | PRN
  Administered 2011-07-28: 4 mg via INTRAVENOUS
  Filled 2011-07-27: qty 2

## 2011-07-27 MED ORDER — BUDESONIDE-FORMOTEROL FUMARATE 160-4.5 MCG/ACT IN AERO
2.0000 | INHALATION_SPRAY | Freq: Two times a day (BID) | RESPIRATORY_TRACT | Status: DC
  Administered 2011-07-27 – 2011-07-28 (×3): 2 via RESPIRATORY_TRACT
  Filled 2011-07-27: qty 6

## 2011-07-27 MED ORDER — 0.9 % SODIUM CHLORIDE (POUR BTL) OPTIME
TOPICAL | Status: DC | PRN
  Administered 2011-07-27: 1000 mL

## 2011-07-27 MED ORDER — HYDROMORPHONE HCL PF 1 MG/ML IJ SOLN
INTRAMUSCULAR | Status: AC
  Administered 2011-07-27: 1 mg via INTRAVENOUS
  Filled 2011-07-27: qty 1

## 2011-07-27 MED ORDER — RIVAROXABAN 10 MG PO TABS
10.0000 mg | ORAL_TABLET | ORAL | Status: DC
  Administered 2011-07-28 – 2011-07-29 (×2): 10 mg via ORAL
  Filled 2011-07-27 (×2): qty 1

## 2011-07-27 MED ORDER — HYDROMORPHONE HCL PF 1 MG/ML IJ SOLN
0.5000 mg | INTRAMUSCULAR | Status: DC | PRN
  Administered 2011-07-27 – 2011-07-28 (×2): 1 mg via INTRAVENOUS
  Filled 2011-07-27 (×2): qty 1

## 2011-07-27 MED ORDER — MIDAZOLAM HCL 5 MG/5ML IJ SOLN
INTRAMUSCULAR | Status: DC | PRN
  Administered 2011-07-27 (×2): 1 mg via INTRAVENOUS

## 2011-07-27 MED ORDER — METHOCARBAMOL 100 MG/ML IJ SOLN
500.0000 mg | Freq: Four times a day (QID) | INTRAVENOUS | Status: DC | PRN
  Administered 2011-07-27: 500 mg via INTRAVENOUS
  Filled 2011-07-27 (×2): qty 5

## 2011-07-27 MED ORDER — ACETAMINOPHEN 325 MG PO TABS
650.0000 mg | ORAL_TABLET | Freq: Four times a day (QID) | ORAL | Status: DC | PRN
  Administered 2011-07-27: 650 mg via ORAL
  Filled 2011-07-27: qty 2

## 2011-07-27 MED ORDER — FERROUS SULFATE 325 (65 FE) MG PO TABS
325.0000 mg | ORAL_TABLET | Freq: Three times a day (TID) | ORAL | Status: DC
  Administered 2011-07-27 – 2011-07-29 (×4): 325 mg via ORAL
  Filled 2011-07-27 (×4): qty 1

## 2011-07-27 MED ORDER — FENOFIBRATE 160 MG PO TABS
160.0000 mg | ORAL_TABLET | Freq: Every day | ORAL | Status: DC
  Administered 2011-07-27 – 2011-07-28 (×2): 160 mg via ORAL
  Filled 2011-07-27 (×3): qty 1

## 2011-07-27 MED ORDER — SODIUM CHLORIDE 0.9 % IV SOLN
INTRAVENOUS | Status: DC
  Administered 2011-07-27 – 2011-07-28 (×2): via INTRAVENOUS
  Filled 2011-07-27 (×9): qty 1000

## 2011-07-27 MED ORDER — PROPOFOL 10 MG/ML IV EMUL
INTRAVENOUS | Status: DC | PRN
  Administered 2011-07-27: 75 ug/kg/min via INTRAVENOUS

## 2011-07-27 MED ORDER — EPHEDRINE SULFATE 50 MG/ML IJ SOLN
INTRAMUSCULAR | Status: DC | PRN
  Administered 2011-07-27: 5 mg via INTRAVENOUS

## 2011-07-27 MED ORDER — PHENOL 1.4 % MT LIQD: 1.0000 | OROMUCOSAL | Status: DC | PRN

## 2011-07-27 MED ORDER — BISACODYL 5 MG PO TBEC: 5.0000 mg | DELAYED_RELEASE_TABLET | Freq: Every day | ORAL | Status: DC | PRN

## 2011-07-27 MED ORDER — LEVOTHYROXINE SODIUM 175 MCG PO TABS
175.0000 ug | ORAL_TABLET | ORAL | Status: DC
  Administered 2011-07-29: 175 ug via ORAL
  Filled 2011-07-27: qty 1

## 2011-07-27 MED ORDER — LEVOTHYROXINE SODIUM 150 MCG PO TABS
150.0000 ug | ORAL_TABLET | ORAL | Status: DC
  Administered 2011-07-28: 150 ug via ORAL
  Filled 2011-07-27: qty 1

## 2011-07-27 MED ORDER — FLUTICASONE PROPIONATE 50 MCG/ACT NA SUSP
1.0000 | Freq: Every day | NASAL | Status: DC
  Administered 2011-07-28 – 2011-07-29 (×2): 1 via NASAL
  Filled 2011-07-27: qty 16

## 2011-07-27 MED ORDER — CEFAZOLIN SODIUM 1-5 GM-% IV SOLN
INTRAVENOUS | Status: AC
  Filled 2011-07-27: qty 100

## 2011-07-27 MED ORDER — KETOROLAC TROMETHAMINE 30 MG/ML IJ SOLN
INTRAMUSCULAR | Status: AC
  Filled 2011-07-27: qty 1

## 2011-07-27 MED ORDER — INSULIN GLARGINE 100 UNIT/ML ~~LOC~~ SOLN
100.0000 [IU] | Freq: Every day | SUBCUTANEOUS | Status: DC
  Administered 2011-07-27 – 2011-07-28 (×2): 100 [IU] via SUBCUTANEOUS

## 2011-07-27 MED ORDER — LIDOCAINE HCL (CARDIAC) 20 MG/ML IV SOLN
INTRAVENOUS | Status: DC | PRN
  Administered 2011-07-27: 100 mg via INTRAVENOUS

## 2011-07-27 MED ORDER — NITROGLYCERIN 0.4 MG SL SUBL
0.4000 mg | SUBLINGUAL_TABLET | SUBLINGUAL | Status: DC | PRN
Start: 2011-07-27 — End: 2011-07-29

## 2011-07-27 MED ORDER — FENTANYL CITRATE 0.05 MG/ML IJ SOLN
INTRAMUSCULAR | Status: DC | PRN
  Administered 2011-07-27: 25 ug via INTRAVENOUS
  Administered 2011-07-27: 50 ug via INTRAVENOUS

## 2011-07-27 MED ORDER — HYDROMORPHONE HCL PF 1 MG/ML IJ SOLN
0.2500 mg | INTRAMUSCULAR | Status: DC | PRN
  Administered 2011-07-27 (×2): 0.5 mg via INTRAVENOUS

## 2011-07-27 SURGICAL SUPPLY — 56 items
BAG ZIPLOCK 12X15 (MISCELLANEOUS) ×2 IMPLANT
BANDAGE ELASTIC 6 VELCRO ST LF (GAUZE/BANDAGES/DRESSINGS) ×2 IMPLANT
BANDAGE ESMARK 6X9 LF (GAUZE/BANDAGES/DRESSINGS) ×1 IMPLANT
BLADE SAW SGTL 13.0X1.19X90.0M (BLADE) ×2 IMPLANT
BNDG ESMARK 6X9 LF (GAUZE/BANDAGES/DRESSINGS) ×2
BONE CEMENT GENTAMICIN (Cement) ×4 IMPLANT
BOWL SMART MIX CTS (DISPOSABLE) ×2 IMPLANT
CEMENT BONE GENTAMICIN 40 (Cement) ×2 IMPLANT
CLOTH BEACON ORANGE TIMEOUT ST (SAFETY) ×2 IMPLANT
CUFF TOURN SGL QUICK 34 (TOURNIQUET CUFF) ×1
CUFF TRNQT CYL 34X4X40X1 (TOURNIQUET CUFF) ×1 IMPLANT
DECANTER SPIKE VIAL GLASS SM (MISCELLANEOUS) ×2 IMPLANT
DERMABOND ADVANCED (GAUZE/BANDAGES/DRESSINGS) ×1
DERMABOND ADVANCED .7 DNX12 (GAUZE/BANDAGES/DRESSINGS) ×1 IMPLANT
DRAPE EXTREMITY T 121X128X90 (DRAPE) ×2 IMPLANT
DRAPE POUCH INSTRU U-SHP 10X18 (DRAPES) ×2 IMPLANT
DRAPE U-SHAPE 47X51 STRL (DRAPES) ×2 IMPLANT
DRSG AQUACEL AG ADV 3.5X10 (GAUZE/BANDAGES/DRESSINGS) ×2 IMPLANT
DRSG TEGADERM 4X4.75 (GAUZE/BANDAGES/DRESSINGS) ×2 IMPLANT
DURAPREP 26ML APPLICATOR (WOUND CARE) ×2 IMPLANT
ELECT REM PT RETURN 9FT ADLT (ELECTROSURGICAL) ×2
ELECTRODE REM PT RTRN 9FT ADLT (ELECTROSURGICAL) ×1 IMPLANT
EVACUATOR 1/8 PVC DRAIN (DRAIN) ×2 IMPLANT
FACESHIELD LNG OPTICON STERILE (SAFETY) ×10 IMPLANT
GAUZE SPONGE 2X2 8PLY STRL LF (GAUZE/BANDAGES/DRESSINGS) ×1 IMPLANT
GLOVE BIOGEL PI IND STRL 7.5 (GLOVE) ×1 IMPLANT
GLOVE BIOGEL PI IND STRL 8 (GLOVE) ×1 IMPLANT
GLOVE BIOGEL PI INDICATOR 7.5 (GLOVE) ×1
GLOVE BIOGEL PI INDICATOR 8 (GLOVE) ×1
GLOVE ECLIPSE 8.0 STRL XLNG CF (GLOVE) ×2 IMPLANT
GLOVE ORTHO TXT STRL SZ7.5 (GLOVE) ×4 IMPLANT
GOWN BRE IMP PREV XXLGXLNG (GOWN DISPOSABLE) ×2 IMPLANT
GOWN STRL NON-REIN LRG LVL3 (GOWN DISPOSABLE) ×2 IMPLANT
HANDPIECE INTERPULSE COAX TIP (DISPOSABLE) ×1
IMMOBILIZER KNEE 20 (SOFTGOODS) ×4
IMMOBILIZER KNEE 20 THIGH 36 (SOFTGOODS) ×2 IMPLANT
KIT BASIN OR (CUSTOM PROCEDURE TRAY) ×2 IMPLANT
MANIFOLD NEPTUNE II (INSTRUMENTS) ×2 IMPLANT
NDL SAFETY ECLIPSE 18X1.5 (NEEDLE) ×1 IMPLANT
NEEDLE HYPO 18GX1.5 SHARP (NEEDLE) ×1
NS IRRIG 1000ML POUR BTL (IV SOLUTION) ×2 IMPLANT
PACK TOTAL JOINT (CUSTOM PROCEDURE TRAY) ×2 IMPLANT
POSITIONER SURGICAL ARM (MISCELLANEOUS) ×2 IMPLANT
SET HNDPC FAN SPRY TIP SCT (DISPOSABLE) ×1 IMPLANT
SET PAD KNEE POSITIONER (MISCELLANEOUS) ×2 IMPLANT
SPONGE GAUZE 2X2 STER 10/PKG (GAUZE/BANDAGES/DRESSINGS) ×1
SUCTION FRAZIER 12FR DISP (SUCTIONS) ×2 IMPLANT
SUT MNCRL AB 4-0 PS2 18 (SUTURE) ×2 IMPLANT
SUT VIC AB 1 CT1 36 (SUTURE) ×6 IMPLANT
SUT VIC AB 2-0 CT1 27 (SUTURE) ×3
SUT VIC AB 2-0 CT1 TAPERPNT 27 (SUTURE) ×3 IMPLANT
SYR 50ML LL SCALE MARK (SYRINGE) ×2 IMPLANT
TOWEL OR 17X26 10 PK STRL BLUE (TOWEL DISPOSABLE) ×4 IMPLANT
TRAY FOLEY CATH 14FRSI W/METER (CATHETERS) ×2 IMPLANT
WATER STERILE IRR 1500ML POUR (IV SOLUTION) ×2 IMPLANT
WRAP KNEE MAXI GEL POST OP (GAUZE/BANDAGES/DRESSINGS) ×2 IMPLANT

## 2011-07-27 NOTE — Plan of Care (Signed)
Problem: Consults Goal: Diagnosis- Total Joint Replacement Primary Total Knee     

## 2011-07-27 NOTE — Preoperative (Signed)
Beta Blockers   Reason not to administer Beta Blockers:Took Lopresssor at 8am 07-27-11

## 2011-07-27 NOTE — Anesthesia Preprocedure Evaluation (Addendum)
Anesthesia Evaluation  Patient identified by MRN, date of birth, ID band Patient awake    Reviewed: Allergy & Precautions, H&P , NPO status , Patient's Chart, lab work & pertinent test results  History of Anesthesia Complications (+) PONV and AWARENESS UNDER ANESTHESIA  Airway Mallampati: II TM Distance: >3 FB Neck ROM: Full    Dental No notable dental hx.    Pulmonary sleep apnea , COPD breath sounds clear to auscultation  Pulmonary exam normal       Cardiovascular hypertension, Pt. on home beta blockers + CAD Rhythm:Regular Rate:Normal     Neuro/Psych  Headaches, negative psych ROS   GI/Hepatic Neg liver ROS, GERD-  ,  Endo/Other  Diabetes mellitus-, Type 2, Insulin Dependent and Oral Hypoglycemic AgentsHypothyroidism   Renal/GU negative Renal ROS  negative genitourinary   Musculoskeletal negative musculoskeletal ROS (+)   Abdominal (+) + obese,   Peds negative pediatric ROS (+)  Hematology negative hematology ROS (+)   Anesthesia Other Findings   Reproductive/Obstetrics negative OB ROS                          Anesthesia Physical Anesthesia Plan  ASA: III  Anesthesia Plan: Spinal   Post-op Pain Management:    Induction: Intravenous  Airway Management Planned:   Additional Equipment:   Intra-op Plan:   Post-operative Plan: Extubation in OR  Informed Consent: I have reviewed the patients History and Physical, chart, labs and discussed the procedure including the risks, benefits and alternatives for the proposed anesthesia with the patient or authorized representative who has indicated his/her understanding and acceptance.   Dental advisory given  Plan Discussed with: CRNA  Anesthesia Plan Comments: (Discussed risks/benefits of spinal including headache, backache, failure, bleeding, infection, and nerve damage. Patient consents to spinal. Questions answered. Coagulation  studies and platelet count acceptable. Due to previous back surgery, I have told the patient there is an increased risk of failure or being unable to get intrathecal space. She would still like to attempt spinal as she doesn't like general anesthesia.)        Anesthesia Quick Evaluation

## 2011-07-27 NOTE — H&P (View-Only) (Signed)
NAMELIESA, TRAMONTANO NO.:  [id number]  MEDICAL RECORD NO.:  [id number]  LOCATION:  [facility]                         FACILITY:  [facility]  PHYSICIAN:  Pietro Cassis. Alvan Dame, M.D.  DATE OF BIRTH:  11/29/1940  DATE OF ADMISSION:  07/22/2011 DATE OF DISCHARGE:                             HISTORY & PHYSICAL   DATE OF SURGERY:  July 27, 2011  ADMITTING DIAGNOSIS:  End-stage osteoarthritis, left knee.  HISTORY OF PRESENT ILLNESS:  This is a 71 year old lady with a history of end-stage osteoarthritis of her left knee that has failed conservative treatment.  After discussion of treatments, benefits, risks, and options, the patient is now scheduled for total knee arthroplasty of the left knee.  The surgery risks, benefits, and aftercare were discussed in detail with the patient.  Questions invited and answered.  Note that her medical doctor is Dr. Daiva Eves.  Her cardiologist is Dr. Carlena Bjornstad and she had clearance by both.  She plans on going home after the surgery.  She is not a candidate for tranexamic acid or dexamethasone, and will not receive either at surgery, and note that she does have sleep apnea and uses CPAP, and is advised to bring her mask and hose with her to the hospital.  She was given her postop medications of aspirin, iron, MiraLax, Robaxin, and Colace.  PAST MEDICAL HISTORY:  Drug allergy to SULFA with a rash.  CURRENT MEDICATIONS: 1. Ibuprofen p.r.n. 2. Symbicort 160-4.5 mcg 1 inhalation b.i.d. 3. ProAir HFA 90 mcg based inhalation p.r.n. 4. Lantus 100 units at bedtime. 5. Low-dose aspirin 81 mg daily. 6. Simvastatin, dosage not known by the patient, 1 daily. 7. Metoprolol 100 mg b.i.d. 8. TriCor 145 mg daily. 9. Levothyroxine 175 mcg every other day and then 200 mcg every other     day. 10.Divalproex 500 mg 2 q.h.s. 11.Diovan 160 mg 1 daily. 12.Furosemide 40 mg p.r.n. fluid retention.  SERIOUS MEDICAL ILLNESSES:  Include chronic obstructive  pulmonary disease, diabetes, hypertension, hyperlipidemia, migraine headaches, sleep apnea, coronary artery disease, and hypothyroidism.  PREVIOUS SURGERIES:  Include lumbar disk surgery x2 and total thyroidectomy, noncancerous.  FAMILY HISTORY:  Positive for heart failure and heart attack.  SOCIAL HISTORY:  The patient is married.  She lives at home.  She does not smoke, but used to, and does not drink, and again is planning on going home after surgery.  PHYSICAL EXAMINATION:  GENERAL:  Reveals an obese lady in no acute distress. VITAL SIGNS:  Blood pressure is 165/77, pulse is 64 and regular, respirations 16. HEENT:  Head normocephalic.  Nose patent.  Ears patent.  Pupils equal, round, and reactive to light.  Throat without injection. NECK:  Supple without adenopathy.  Carotids 2+ without bruit. CHEST:  Clear to auscultation.  No rales or rhonchi.  Respirations 16. HEART:  Regular rate and rhythm at 64 beats per minute with 1/6 systolic ejection murmur. ABDOMEN:  Soft with active bowel sounds.  No masses or organomegaly. NEUROLOGIC:  The patient alert and oriented to time, place, and person. Cranial nerves 2 through 12 grossly intact. EXTREMITIES:  Shows the left knee with a valgus deformity, full extension with further flexion  to 115 degrees, with pain.  Her right knee shows full extension, just a very slight valgus deformity.  Further flexion to 125 degrees. NEUROVASCULAR:  Status intact.  Dorsalis pedis, posterior tibialis pulses are 2+.  ASSESSMENT:  End-stage osteoarthritis, left knee.  PLAN:  Total knee arthroplasty, left knee.     Judith Part. Leelah Hanna, P.A.   ______________________________ Pietro Cassis Alvan Dame, M.D.    SJC/MEDQ  D:  07/22/2011  T:  07/22/2011  Job:  [id number]

## 2011-07-27 NOTE — Transfer of Care (Signed)
Immediate Anesthesia Transfer of Care Note  Patient: Kayla [name]  Procedure(s) Performed: Procedure(s) (LRB): TOTAL KNEE ARTHROPLASTY (Left)  Patient Location: PACU  Anesthesia Type: MAC combined with regional for post-op pain  Level of Consciousness: awake, alert  and oriented  Airway & Oxygen Therapy: Patient connected to nasal cannula oxygen and Patient connected to face mask oxygen  Post-op Assessment: Report given to PACU RN and Post -op Vital signs reviewed and stable  Post vital signs: Reviewed and stable  Complications: No apparent anesthesia complications

## 2011-07-27 NOTE — Op Note (Signed)
NAME:  Kayla [name]                      MEDICAL RECORD NO.:  [id number]                             FACILITY:  [facility]      PHYSICIAN:  Pietro Cassis. Alvan Dame, M.D.  DATE OF BIRTH:  18-Mar-1941      DATE OF PROCEDURE:  07/27/2011                                     OPERATIVE REPORT         PREOPERATIVE DIAGNOSIS:  Left knee osteoarthritis.      POSTOPERATIVE DIAGNOSIS:  Left knee osteoarthritis.      FINDINGS:  The patient was noted to have complete loss of cartilage and   bone-on-bone arthritis with associated osteophytes in the lateral and patellofemoral compartments of   the knee with a significant synovitis and associated effusion.      PROCEDURE:  Left total knee replacement.      COMPONENTS USED:  DePuy rotating platform posterior stabilized knee   system, a size 2.5 femur, 3 tibia, 12.5 mm insert, and 38 patellar   button.      SURGEON:  Pietro Cassis. Alvan Dame, M.D.      ASSISTANT:  Danae Orleans, PA-C.      ANESTHESIA:  Spinal.      SPECIMENS:  None.      COMPLICATION:  None.      DRAINS:  One Hemovac.  EBL: ,100cc      TOURNIQUET TIME:   Total Tourniquet Time Documented: Thigh (Left) - 32 minutes .      The patient was stable to the recovery room.      INDICATION FOR PROCEDURE:  Kayla [name] is a 71 y.o. female patient of   mine.  The patient had been seen, evaluated, and treated conservatively in the   office with medication, activity modification, and injections.  The patient had   radiographic changes of bone-on-bone arthritis with endplate sclerosis and osteophytes noted.      The patient failed conservative measures including medication, injections, and activity modification, and at this point was ready for more definitive measures.   Based on the radiographic changes and failed conservative measures, the patient   decided to proceed with total knee replacement.  Risks of infection,   DVT, component failure, need for revision surgery, postop course, and   expectations were all   discussed and reviewed.  Consent was obtained for benefit of pain   relief.      PROCEDURE IN DETAIL:  The patient was brought to the operative theater.   Once adequate anesthesia, preoperative antibiotics, 2 gm of Ancef administered, the patient was positioned supine with the left thigh tourniquet placed.  The  left lower extremity was prepped and draped in sterile fashion.  A time-   out was performed identifying the patient, planned procedure, and   extremity.      The left lower extremity was placed in the [facility] leg holder.  The leg was   exsanguinated, tourniquet elevated to 250 mmHg.  A midline incision was   made followed by median parapatellar arthrotomy.  Following initial   exposure, attention was first directed to the patella.  Precut  measurement was noted to be 20 mm.  I resected down to 12 mm and used a   38 patellar button to restore patellar height as well as cover the cut   surface.      The lug holes were drilled and a metal shim was placed to protect the   patella from retractors and saw blades.      At this point, attention was now directed to the femur.  The femoral   canal was opened with a drill, irrigated to try to prevent fat emboli.  An   intramedullary rod was passed at 3 degrees valgus, 10 mm of bone was   resected off the distal femur.  Following this resection, the tibia was   subluxated anteriorly.  Using the extramedullary guide, 8 mm of bone was resected off   the proximal lateral tibia.  We confirmed the gap would be   stable medially and laterally with a 10 mm insert as well as confirmed   the cut was perpendicular in the coronal plane, checking with an alignment rod.      Once this was done, I sized the femur to be a size 2.5 in the anterior-   posterior dimension, chose a standard component based on medial and   lateral dimension.  The size 2.5 rotation block was then pinned in   position anterior referenced using the  C-clamp to set rotation.  The   anterior, posterior, and  chamfer cuts were made without difficulty nor   notching making certain that I was along the anterior cortex to help   with flexion gap stability.      The final box cut was made off the lateral aspect of distal femur.      At this point, the tibia was sized to be a size 3, the size 3 tray was   then pinned in position through the medial third of the tubercle,   drilled, and keel punched.  Trial reduction was now carried with a 2.5 femur,  3 tibia, a 12.5 mm insert, and the 38 patella botton.  The knee was brought to   extension, full extension with good flexion stability with the patella   tracking through the trochlea without application of pressure.  Given   all these findings, the trial components removed.  Final components were   opened and cement was mixed.  The knee was irrigated with normal saline   solution and pulse lavage.  The synovial lining was   then injected with 0.25% Marcaine with epinephrine and 1 cc of Toradol,   total of 61 cc.      The knee was irrigated.  Final implants were then cemented onto clean and   dried cut surfaces of bone with the knee brought to extension with a 12.5   mm trial insert.      Once the cement had fully cured, the excess cement was removed   throughout the knee.  I confirmed I was satisfied with the range of   motion and stability, and the final 12.5 mm insert was chosen.  It was   placed into the knee.      The tourniquet had been let down at 32 minutes.  No significant   hemostasis required.  The medium Hemovac drain was placed deep.  The   extensor mechanism was then reapproximated using #1 Vicryl with the knee   in flexion.  The   remaining wound was closed with 2-0 Vicryl  and running 4-0 Monocryl.   The knee was cleaned, dried, dressed sterilely using Dermabond and   Aquacel dressing.  Drain site dressed separately.  The patient was then   brought to recovery room in stable  condition, tolerating the procedure   well.   Please note that Physician Assistant, Danae Orleans, PA-C, was present for the entirety of the case, and was utilized for pre-operative positioning, peri-operative retractor management, general facilitation of the procedure.  He was also utilized for primary wound closure at the end of the case.              Pietro Cassis Alvan Dame, M.D.

## 2011-07-27 NOTE — Anesthesia Procedure Notes (Signed)
Spinal  Staffing Anesthesiologist: Salley Scarlet Performed by: anesthesiologist  Preanesthetic Checklist Completed: patient identified, site marked, surgical consent, pre-op evaluation, timeout performed, IV checked, risks and benefits discussed and monitors and equipment checked Spinal Block Patient position: sitting Prep: Betadine Patient monitoring: heart rate, cardiac monitor, continuous pulse ox and blood pressure Approach: midline Location: L2-3 Injection technique: single-shot Needle Needle type: Quincke  Needle gauge: 22 G Additional Notes No paresthesia noted. CSF clear.

## 2011-07-27 NOTE — Anesthesia Postprocedure Evaluation (Signed)
  Anesthesia Post-op Note  Patient: Kayla [name]  Procedure(s) Performed: Procedure(s) (LRB): TOTAL KNEE ARTHROPLASTY (Left)  Patient Location: PACU  Anesthesia Type: Spinal.  Level of Consciousness: awake and alert   Airway and Oxygen Therapy: Patient Spontanous Breathing  Post-op Pain: mild  Post-op Assessment: Post-op Vital signs reviewed, Patient's Cardiovascular Status Stable, Respiratory Function Stable, Patent Airway and No signs of Nausea or vomiting  Post-op Vital Signs: stable  Complications: No apparent anesthesia complications. Moving both feet. Denies back pain

## 2011-07-27 NOTE — Interval H&P Note (Signed)
History and Physical Interval Note:  07/27/2011 12:48 PM  Kayla [name]  has presented today for surgery, with the diagnosis of left knee osteoarthritis  The various methods of treatment have been discussed with the patient and family. After consideration of risks, benefits and other options for treatment, the patient has consented to  Procedure(s) (LRB): LEFT TOTAL KNEE ARTHROPLASTY (Left) as a surgical intervention .  The patients' history has been reviewed, patient examined, no change in status, stable for surgery.  I have reviewed the patients' chart and labs.  Questions were answered to the patient's satisfaction.     Mauri Pole

## 2011-07-28 LAB — CBC
HCT: 30.1 % — ABNORMAL LOW (ref 36.0–46.0)
Hemoglobin: 9.4 g/dL — ABNORMAL LOW (ref 12.0–15.0)
MCH: 29.5 pg (ref 26.0–34.0)
MCHC: 31.2 g/dL (ref 30.0–36.0)
MCV: 94.4 fL (ref 78.0–100.0)
Platelets: 285 10*3/uL (ref 150–400)
RBC: 3.19 MIL/uL — ABNORMAL LOW (ref 3.87–5.11)
RDW: 13.6 % (ref 11.5–15.5)
WBC: 9.3 10*3/uL (ref 4.0–10.5)

## 2011-07-28 LAB — BASIC METABOLIC PANEL
BUN: 27 mg/dL — ABNORMAL HIGH (ref 6–23)
CO2: 27 mEq/L (ref 19–32)
Calcium: 8.4 mg/dL (ref 8.4–10.5)
Chloride: 101 mEq/L (ref 96–112)
Creatinine, Ser: 1.08 mg/dL (ref 0.50–1.10)
GFR calc Af Amer: 59 mL/min — ABNORMAL LOW (ref >90)
GFR calc non Af Amer: 51 mL/min — ABNORMAL LOW (ref >90)
Glucose, Bld: 209 mg/dL — ABNORMAL HIGH (ref 70–99)
Potassium: 4 mEq/L (ref 3.5–5.1)
Sodium: 136 mEq/L (ref 135–145)

## 2011-07-28 LAB — GLUCOSE, CAPILLARY
Glucose-Capillary: 109 mg/dL — ABNORMAL HIGH (ref 70–99)
Glucose-Capillary: 146 mg/dL — ABNORMAL HIGH (ref 70–99)
Glucose-Capillary: 114 mg/dL — ABNORMAL HIGH (ref 70–99)
Glucose-Capillary: 155 mg/dL — ABNORMAL HIGH (ref 70–99)
Glucose-Capillary: 162 mg/dL — ABNORMAL HIGH (ref 70–99)

## 2011-07-28 NOTE — Evaluation (Signed)
Occupational Therapy Evaluation Patient Details Name: Kayla [name] MRN: [id number] DOB: 04/15/1941 Today's Date: 07/28/2011  Problem List:  Patient Active Problem List  Diagnoses  . HYPOTHYROIDISM, POSTSURGICAL  . DIABETES, TYPE 2  . HYPERLIPIDEMIA  . OBESITY  . OBSTRUCTIVE SLEEP APNEA  . HYPERTENSION  . CAD  . PULMONARY HYPERTENSION, SECONDARY  . VENTRICULAR HYPERTROPHY, LEFT  . CAROTID ARTERY DISEASE  . COPD  . G E R D  . DIVERTICULOSIS OF COLON  . GENERALIZED OSTEOARTHROSIS UNSPECIFIED SITE  . HEADACHE  . RHINITIS  . Palpitations  . Dizzy spells  . S/P left TKA    Past Medical History:  Past Medical History  Diagnosis Date  . GERD (gastroesophageal reflux disease)   . Diverticula of colon   . CAD (coronary artery disease)     Mild per remote cath in 2006, /    Nuclear, January, 2013, no significant abnormality,  . Hyperlipidemia   . Pulmonary hypertension, secondary   . Leg edema     occasional swelling  . Goiter     hx of  . Obesity   . Diabetes mellitus, type 2   . Hypothyroidism   . Left ventricular hypertrophy   . Carotid artery disease     Doppler, January, 2013, 0 [id number] RIC A., [id number] LICA, stable  . Dizziness     occasional  . COPD (chronic obstructive pulmonary disease)   . Headache     migraine  . Arthritis   . Complication of anesthesia     wakes up "mean"  . PONV (postoperative nausea and vomiting)   . OSA (obstructive sleep apnea)     cpap setting of 12   Past Surgical History:  Past Surgical History  Procedure Date  . Thyroidectomy 2011  . Back surgery 1990's    lower back    OT Assessment/Plan/Recommendation OT Assessment Clinical Impression Statement: Pt doing well POD#1 LTKR. All education completed. Pt's BSC has already been delivered and pt will have necessary level of A from family upon d/c. No f/u OT needed. OT Recommendation/Assessment: Patient does not need any further OT services OT Recommendation Follow Up  Recommendations: No OT follow up Equipment Recommended: 3 in 1 bedside comode (already delivered.) OT Goals    OT Evaluation Precautions/Restrictions  Precautions Precautions: Knee Required Braces or Orthoses: Yes Knee Immobilizer: Discontinue once straight leg raise with < 10 degree lag Restrictions Weight Bearing Restrictions: No Other Position/Activity Restrictions: WBAT Prior Functioning Home Living Lives With: Spouse Receives Help From: Family Type of Home: House Home Layout: One level Home Access: Stairs to enter Entrance Stairs-Rails: Right Entrance Stairs-Number of Steps: 2 Bathroom Shower/Tub: Chiropodist: Standard Home Adaptive Equipment: Walker - rolling Prior Function Level of Independence: Independent with basic ADLs;Independent with transfers;Independent with gait;Independent with homemaking with ambulation;Requires assistive device for independence Driving: Yes Comments: was using walker due to increased pain.  ADL ADL Grooming: Performed;Wash/dry hands;Supervision/safety Where Assessed - Grooming: Standing at sink Upper Body Bathing: Simulated;Supervision/safety Where Assessed - Upper Body Bathing: Standing at sink Lower Body Bathing: Simulated;Minimal assistance Where Assessed - Lower Body Bathing: Sit to stand from bed Upper Body Dressing: Simulated;Supervision/safety Where Assessed - Upper Body Dressing: Standing Lower Body Dressing: Simulated;Minimal assistance Where Assessed - Lower Body Dressing: Sit to stand from bed Toilet Transfer: Performed;Supervision/safety Toilet Transfer Method: Counselling psychologist: Raised toilet seat with arms (or 3-in-1 over toilet) Toileting - Clothing Manipulation: Performed;Supervision/safety Where Assessed - Toileting Clothing Manipulation: Sit to stand from  3-in-1 or toilet Toileting - Hygiene: Performed;Supervision/safety Where Assessed - Toileting Hygiene: Sit to stand from  3-in-1 or toilet Tub/Shower Transfer: Not assessed (Pt stated she would spongebathe until able to step into tub) Tub/Shower Transfer Method: Not assessed ADL Comments: Pt doing well demos good standing tolerance and safe technique. Vision/Perception    Cognition Cognition Arousal/Alertness: Awake/alert Overall Cognitive Status: Appears within functional limits for tasks assessed Orientation Level: Oriented X4 Sensation/Coordination Sensation Light Touch: Appears Intact Coordination Gross Motor Movements are Fluid and Coordinated: Yes Extremity Assessment RUE Assessment RUE Assessment: Within Functional Limits LUE Assessment LUE Assessment: Within Functional Limits Mobility  Bed Mobility Bed Mobility: Yes Supine to Sit: 5: Supervision Supine to Sit Details (indicate cue type and reason): Supervision for safety.  Pt does well using UE and getting B LE off of bed.  Transfers Sit to Stand: 5: Supervision;From bed;From chair/3-in-1;With upper extremity assist Sit to Stand Details (indicate cue type and reason): 1 VC needed for hand placement. Stand to Sit: 5: Supervision;With upper extremity assist;To chair/3-in-1;To bed;With armrests Stand to Sit Details: 1 VC needed for hand placement. Exercises   End of Session OT - End of Session Activity Tolerance: Patient tolerated treatment well Patient left: in bed;with call bell in reach General Behavior During Session: [facility] for tasks performed Cognition: [facility] for tasks performed   Sophea Rackham A, OTR/L 07/28/2011, 12:24 PM

## 2011-07-28 NOTE — Progress Notes (Signed)
Patient ID: Kayla [name], female   DOB: [birth year], 71 y.o.   MRN: [id number] Subjective: 1 Day Post-Op Procedure(s) (LRB): TOTAL KNEE ARTHROPLASTY (Left)    Patient reports pain as mild, but I feel FNB still effecting her.  Otherwise no events  Objective:   VITALS:   Filed Vitals:   07/28/11 0126  BP: 130/75  Pulse: 60  Temp: 97.9 F (36.6 C)  Resp: 16    Neurovascular intact Incision: dressing C/D/I. ACE wrap in place, HV removed without difficulty  LABS  Basename 07/28/11 0407  HGB 9.4*  HCT 30.1*  WBC 9.3  PLT 285     Basename 07/28/11 0407  NA 136  K 4.0  BUN 27*  CREATININE 1.08  GLUCOSE 209*    No results found for this basename: LABPT:2,INR:2 in the last 72 hours   Assessment/Plan: 1 Day Post-Op Procedure(s) (LRB): TOTAL KNEE ARTHROPLASTY (Left)   Up with therapy D/C IV fluids Plan for discharge tomorrow

## 2011-07-28 NOTE — Evaluation (Signed)
Physical Therapy Evaluation Patient Details Name: ZYKERA MROSS MRN: [id number] DOB: 05/12/1940 Today's Date: 07/28/2011  Problem List:  Patient Active Problem List  Diagnoses  . HYPOTHYROIDISM, POSTSURGICAL  . DIABETES, TYPE 2  . HYPERLIPIDEMIA  . OBESITY  . OBSTRUCTIVE SLEEP APNEA  . HYPERTENSION  . CAD  . PULMONARY HYPERTENSION, SECONDARY  . VENTRICULAR HYPERTROPHY, LEFT  . CAROTID ARTERY DISEASE  . COPD  . G E R D  . DIVERTICULOSIS OF COLON  . GENERALIZED OSTEOARTHROSIS UNSPECIFIED SITE  . HEADACHE  . RHINITIS  . Palpitations  . Dizzy spells  . S/P left TKA    Past Medical History:  Past Medical History  Diagnosis Date  . GERD (gastroesophageal reflux disease)   . Diverticula of colon   . CAD (coronary artery disease)     Mild per remote cath in 2006, /    Nuclear, January, 2013, no significant abnormality,  . Hyperlipidemia   . Pulmonary hypertension, secondary   . Leg edema     occasional swelling  . Goiter     hx of  . Obesity   . Diabetes mellitus, type 2   . Hypothyroidism   . Left ventricular hypertrophy   . Carotid artery disease     Doppler, January, 2013, 0 [id number] RIC A., [id number] LICA, stable  . Dizziness     occasional  . COPD (chronic obstructive pulmonary disease)   . Headache     migraine  . Arthritis   . Complication of anesthesia     wakes up "mean"  . PONV (postoperative nausea and vomiting)   . OSA (obstructive sleep apnea)     cpap setting of 12   Past Surgical History:  Past Surgical History  Procedure Date  . Thyroidectomy 2011  . Back surgery 1990's    lower back    PT Assessment/Plan/Recommendation PT Assessment Clinical Impression Statement: Pt presents with L TKA POD 1 with decreased strength, ROM and mobility.  Pt tolerated ambulation very well and is very motivated to do well with therapy.  Pt will benefit from skilled PT in acute venue to address deficits.  PT recommends HHPT for follow up therapy at D/C.  PT  Recommendation/Assessment: Patient will need skilled PT in the acute care venue PT Problem List: Decreased strength;Decreased range of motion;Decreased activity tolerance;Decreased balance;Decreased mobility;Decreased safety awareness;Decreased knowledge of use of DME Barriers to Discharge: None PT Therapy Diagnosis : Difficulty walking;Abnormality of gait;Generalized weakness PT Plan PT Frequency: 7X/week PT Treatment/Interventions: DME instruction;Gait training;Stair training;Functional mobility training;Therapeutic activities;Therapeutic exercise;Balance training;Patient/family education PT Recommendation Recommendations for Other Services: OT consult Follow Up Recommendations: Home health PT Equipment Recommended: None recommended by PT PT Goals  Acute Rehab PT Goals PT Goal Formulation: With patient/family Time For Goal Achievement: 3 days Pt will go Sit to Stand: with modified independence PT Goal: Sit to Stand - Progress: Goal set today Pt will go Stand to Sit: with modified independence PT Goal: Stand to Sit - Progress: Goal set today Pt will Ambulate: >150 feet;with modified independence;with least restrictive assistive device PT Goal: Ambulate - Progress: Goal set today Pt will Go Up / Down Stairs: 1-2 stairs;with supervision;with least restrictive assistive device PT Goal: Up/Down Stairs - Progress: Goal set today Pt will Perform Home Exercise Program: with supervision, verbal cues required/provided PT Goal: Perform Home Exercise Program - Progress: Goal set today  PT Evaluation Precautions/Restrictions  Precautions Precautions: Knee Required Braces or Orthoses: Yes Knee Immobilizer: Discontinue once straight leg raise with <  10 degree lag Restrictions Weight Bearing Restrictions: No Other Position/Activity Restrictions: WBAT Prior Functioning  Home Living Lives With: Spouse Receives Help From: Family Type of Home: House Home Layout: One level Home Access: Stairs  to enter Entrance Stairs-Rails: Right Entrance Stairs-Number of Steps: 2 Bathroom Shower/Tub: Tub/shower unit Home Adaptive Equipment: Walker - rolling Prior Function Level of Independence: Independent with basic ADLs;Independent with gait;Independent with transfers Driving: Yes Comments: was using walker due to increased pain.  Cognition Cognition Arousal/Alertness: Awake/alert Overall Cognitive Status: Appears within functional limits for tasks assessed Orientation Level: Oriented X4 Sensation/Coordination Sensation Light Touch: Appears Intact Coordination Gross Motor Movements are Fluid and Coordinated: Yes Extremity Assessment RLE Assessment RLE Assessment: Within Functional Limits LLE Assessment LLE Assessment: Exceptions to [facility] LLE Strength LLE Overall Strength Comments: Ankle WFL, pt able to perform SLR x 10 with <10 deg lag, therefore D/C'd KI.  Mobility (including Balance) Bed Mobility Bed Mobility: Yes Supine to Sit: 5: Supervision Supine to Sit Details (indicate cue type and reason): Supervision for safety.  Pt does well using UE and getting B LE off of bed.  Transfers Transfers: Yes Sit to Stand: 4: Min assist;From elevated surface;With upper extremity assist;From bed Sit to Stand Details (indicate cue type and reason): Min/guard for safety with cues for hand placement on bed.  Stand to Sit: 4: Min assist;With upper extremity assist;With armrests;To chair/3-in-1 Stand to Sit Details: Min/guard for safety with cues for hand placement on arm rest due to pt tendency to "flop" in chair.  Ambulation/Gait Ambulation/Gait: Yes Ambulation/Gait Assistance: 4: Min assist Ambulation/Gait Assistance Details (indicate cue type and reason): Min/guard for safety with cues for sequencing/technique with RW and for upright posture.   Ambulation Distance (Feet): 100 Feet Assistive device: Rolling walker Gait Pattern: Step-to pattern;Trunk flexed Gait velocity: Decreased Stairs:  No Wheelchair Mobility Wheelchair Mobility: No  Posture/Postural Control Posture/Postural Control: No significant limitations Exercise    End of Session PT - End of Session Equipment Utilized During Treatment: Gait belt Activity Tolerance: Patient tolerated treatment well Patient left: in chair;with call bell in reach Nurse Communication: Mobility status for transfers;Mobility status for ambulation General Behavior During Session: [facility] for tasks performed Cognition: [facility] for tasks performed  Page, Betha Loa 07/28/2011, 10:02 AM

## 2011-07-28 NOTE — Progress Notes (Signed)
CARE MANAGEMENT NOTE 07/28/2011  Patient:  Kayla [name], Kayla [name]   Account Number:  [id number]  Date Initiated:  07/28/2011  Documentation initiated by:  Sherrin Daisy  Subjective/Objective Assessment:   dx osteoarthritis knee-left; total knee replacement     Action/Plan:   CM spoke with patient and family. Plans are for patient to return to her home in  [place] ([place]) where spouse and daughter-in-law will be caregivers. She already has RW   Anticipated DC Date:  07/29/2011   Anticipated DC Plan:  [place]  In-house referral  NA      DC Planning Services  CM consult      [facility] Choice  HOME HEALTH   Choice offered to / List presented to:  C-1 Patient   DME arranged  NA      DME agency  NA     [place] arranged  HH-2 PT      [place]   Status of service:  In process, will continue to follow Medicare Important Message given?   (If response is "NO", the following Medicare IM given date fields will be blank) Date Medicare IM given:   Date Additional Medicare IM given:    Discharge Disposition:    Per UR Regulation:    If discussed at Long Length of Stay Meetings, dates discussed:    Comments:  07/28/2011 [place] BSN CCM [phone] Pt wants [facility] agency that doctor's office has referred her to. [facility] was pre-arranged for patient prior to admission. 3N1 arranged by [facility] and has already ben delivered to room. Pt anticipates discharge tomorrrow. List of choice HH agencies placed on chart.

## 2011-07-28 NOTE — Progress Notes (Signed)
RT in to give pt inhaler.  Pt was found already on CPAP.  Pt tolerating well.  Pt encouraged to notify RN/ RT of any concerns.

## 2011-07-28 NOTE — Progress Notes (Signed)
Physical Therapy Treatment Patient Details Name: Kayla [name] MRN: [id number] DOB: 01/23/1941 Today's Date: 07/28/2011  PT Assessment/Plan  PT - Assessment/Plan Comments on Treatment Session: Pt progressing very well with ambulation and exercises.  Pt to D/C tomorrow, will stair train in am.  PT Plan: Discharge plan remains appropriate PT Frequency: 7X/week Recommendations for Other Services: OT consult Follow Up Recommendations: Home health PT Equipment Recommended: 3 in 1 bedside comode PT Goals  Acute Rehab PT Goals PT Goal Formulation: With patient/family Time For Goal Achievement: 3 days Pt will go Sit to Stand: with modified independence PT Goal: Sit to Stand - Progress: Met Pt will go Stand to Sit: with modified independence PT Goal: Stand to Sit - Progress: Met Pt will Ambulate: >150 feet;with modified independence;with least restrictive assistive device PT Goal: Ambulate - Progress: Progressing toward goal Pt will Go Up / Down Stairs: 1-2 stairs;with supervision;with least restrictive assistive device PT Goal: Up/Down Stairs - Progress: Goal set today Pt will Perform Home Exercise Program: with supervision, verbal cues required/provided PT Goal: Perform Home Exercise Program - Progress: Progressing toward goal  PT Treatment Precautions/Restrictions  Precautions Precautions: Knee Required Braces or Orthoses: Yes Knee Immobilizer: Discontinue once straight leg raise with < 10 degree lag Restrictions Weight Bearing Restrictions: No Other Position/Activity Restrictions: WBAT Mobility (including Balance) Bed Mobility Bed Mobility: Yes Supine to Sit: 6: Modified independent (Device/Increase time) Supine to Sit Details (indicate cue type and reason): Supervision for safety.  Pt does well using UE and getting B LE off of bed.  Sit to Supine: 6: Modified independent (Device/Increase time) Transfers Transfers: Yes Sit to Stand: 6: Modified independent (Device/Increase  time);With upper extremity assist;From elevated surface;From bed Sit to Stand Details (indicate cue type and reason): 1 VC needed for hand placement. Stand to Sit: 6: Modified independent (Device/Increase time);With upper extremity assist;To elevated surface;To bed Stand to Sit Details: 1 VC needed for hand placement. Ambulation/Gait Ambulation/Gait: Yes Ambulation/Gait Assistance: 5: Supervision Ambulation/Gait Assistance Details (indicate cue type and reason): Supervision for safety with cues for upright posture.  Ambulation Distance (Feet): 85 Feet Assistive device: Rolling walker Gait Pattern: Step-to pattern;Trunk flexed Gait velocity: Decreased Stairs: No Wheelchair Mobility Wheelchair Mobility: No  Posture/Postural Control Posture/Postural Control: No significant limitations Exercise  Total Joint Exercises Ankle Circles/Pumps: AROM;Both;20 reps;Supine Quad Sets: AROM;Left;10 reps;Supine Short Arc Quad: AROM;Left;10 reps;Supine Heel Slides: AAROM;Left;10 reps;Supine Hip ABduction/ADduction: AROM;Left;10 reps;Supine Straight Leg Raises: AROM;Left;10 reps;Supine End of Session PT - End of Session Equipment Utilized During Treatment: Gait belt Activity Tolerance: Patient tolerated treatment well Patient left: in bed;with call bell in reach Nurse Communication: Other (comment) (RN aware of pts request for pain meds) General Behavior During Session: [facility] for tasks performed Cognition: [facility] for tasks performed  Page, Betha Loa 07/28/2011, 1:49 PM

## 2011-07-29 LAB — CBC
HCT: 27.5 % — ABNORMAL LOW (ref 36.0–46.0)
Hemoglobin: 8.9 g/dL — ABNORMAL LOW (ref 12.0–15.0)
MCH: 30.2 pg (ref 26.0–34.0)
MCHC: 32.4 g/dL (ref 30.0–36.0)
MCV: 93.2 fL (ref 78.0–100.0)
Platelets: 311 10*3/uL (ref 150–400)
RBC: 2.95 MIL/uL — ABNORMAL LOW (ref 3.87–5.11)
RDW: 13.9 % (ref 11.5–15.5)
WBC: 10.8 10*3/uL — ABNORMAL HIGH (ref 4.0–10.5)

## 2011-07-29 LAB — GLUCOSE, CAPILLARY: Glucose-Capillary: 126 mg/dL — ABNORMAL HIGH (ref 70–99)

## 2011-07-29 LAB — BASIC METABOLIC PANEL
BUN: 28 mg/dL — ABNORMAL HIGH (ref 6–23)
CO2: 27 mEq/L (ref 19–32)
Calcium: 8.5 mg/dL (ref 8.4–10.5)
Chloride: 101 mEq/L (ref 96–112)
Creatinine, Ser: 1.77 mg/dL — ABNORMAL HIGH (ref 0.50–1.10)
GFR calc Af Amer: 32 mL/min — ABNORMAL LOW (ref >90)
GFR calc non Af Amer: 28 mL/min — ABNORMAL LOW (ref >90)
Glucose, Bld: 162 mg/dL — ABNORMAL HIGH (ref 70–99)
Potassium: 4.3 mEq/L (ref 3.5–5.1)
Sodium: 136 mEq/L (ref 135–145)

## 2011-07-29 MED ORDER — OXYCODONE HCL 5 MG PO TABS: 5.0000 mg | ORAL_TABLET | ORAL | Status: AC | PRN

## 2011-07-29 MED ORDER — DIPHENHYDRAMINE HCL 25 MG PO CAPS: 25.0000 mg | ORAL_CAPSULE | Freq: Four times a day (QID) | ORAL | Status: AC | PRN

## 2011-07-29 MED ORDER — FERROUS SULFATE 325 (65 FE) MG PO TABS: 325.0000 mg | ORAL_TABLET | Freq: Three times a day (TID) | ORAL | Status: DC

## 2011-07-29 MED ORDER — ACETAMINOPHEN 325 MG PO TABS: 650.0000 mg | ORAL_TABLET | Freq: Four times a day (QID) | ORAL | Status: AC | PRN

## 2011-07-29 MED ORDER — POLYETHYLENE GLYCOL 3350 17 G PO PACK: 17.0000 g | PACK | Freq: Two times a day (BID) | ORAL | Status: AC

## 2011-07-29 MED ORDER — METHOCARBAMOL 500 MG PO TABS: 500.0000 mg | ORAL_TABLET | Freq: Four times a day (QID) | ORAL | Status: AC | PRN

## 2011-07-29 MED ORDER — DSS 100 MG PO CAPS: 100.0000 mg | ORAL_CAPSULE | Freq: Two times a day (BID) | ORAL | Status: AC

## 2011-07-29 MED ORDER — ASPIRIN EC 325 MG PO TBEC: 325.0000 mg | DELAYED_RELEASE_TABLET | Freq: Two times a day (BID) | ORAL | Status: DC

## 2011-07-29 NOTE — Progress Notes (Signed)
Subjective: 2 Days Post-Op Procedure(s) (LRB): TOTAL KNEE ARTHROPLASTY (Left)   Patient reports pain as mild. Pain well controlled with pain medication. No events. Ready to be discharged home.  Objective:   VITALS:   Filed Vitals:   07/29/11 0649  BP: 120/73  Pulse: 65  Temp: 99 F (37.2 C)  Resp: 14    Neurovascular intact Dorsiflexion/Plantar flexion intact Incision: dressing C/[name]/I No cellulitis present Compartment soft  LABS  Basename 07/29/11 0425 07/28/11 0407  HGB 8.9* 9.4*  HCT 27.5* 30.1*  WBC 10.8* 9.3  PLT 311 285     Basename 07/29/11 0425 07/28/11 0407  NA 136 136  K 4.3 4.0  BUN 28* 27*  CREATININE 1.77* 1.08  GLUCOSE 162* 209*     Assessment/Plan: 2 Days Post-Op Procedure(s) (LRB): TOTAL KNEE ARTHROPLASTY (Left)   Up with therapy Discharge home with home health  Follow up in 2 weeks at [name].  Follow-up Information    Follow up with OLIN,Kayla [name] in 2 weeks.   Contact information:   [facility] [address] [name] Kayla [name]   PAC  07/29/2011, 8:43 AM

## 2011-07-29 NOTE — Discharge Summary (Signed)
Physician Discharge Summary  Patient ID: Kayla [name] MRN: [id number] DOB/AGE: May 15, 1940 71 y.o.  Admit date: 07/27/2011 Discharge date: 07/29/2011  Procedures:  Procedure(s) (LRB): TOTAL KNEE ARTHROPLASTY (Left)  Attending Physician:  Dr. Paralee Cancel   Admission Diagnoses: End-stage osteoarthritis, left knee.  Discharge Diagnoses:  Principal Problem:  *S/P left TKA COPD DM HTN Hyperlipidemia Migraine headaches Sleep apnea CAD Hypothyroidism  HPI: This is a 71 year old lady with a history of end-stage osteoarthritis of her left knee that has failed conservative treatment. After discussion of treatments, benefits, risks, and options, the patient is now scheduled for total knee arthroplasty of the left knee. The surgery risks, benefits, and aftercare were discussed in detail with the patient. Questions invited and answered. Note that her medical doctor is Dr. Daiva Eves. Her cardiologist is Dr. Carlena Bjornstad and she had clearance by both. She plans on going home after the surgery. She is not a candidate for  tranexamic acid or dexamethasone, and will not receive either at surgery, and note that she does have sleep apnea and uses CPAP, and is  advised to bring her mask and hose with her to the hospital. She was given her postop medications of aspirin, iron, MiraLax, Robaxin, and Colace.  PCP: [name], MD, MD   Discharged Condition: good  Hospital Course:  Patient underwent the above stated procedure on 07/27/2011. Patient tolerated the procedure well and brought to the recovery room in good condition and subsequently to the floor.  POD #1 BP: 130/75 ; Pulse: 60 ; Temp: 97.9 F (36.6 C) ; Resp: 16  Pt's foley was removed, as well as the hemovac drain removed. IV was changed to a saline lock. Neurovascular intact and incision: dressing C/D/I. ACE wrap in place.  LABS  Basename  07/28/11 0407   HGB  9.4  HCT  30.1    POD #2  BP: 120/73 ; Pulse: 65 ; Temp: 99  F (37.2 C) ; Resp: 14  Patient reports pain as mild. Pain well controlled with pain medication. No events. Ready to be discharged home. Neurovascular intact, dorsiflexion/plantar flexion intact, incision: dressing C/D/I, no cellulitis present and compartment soft.   LABS  Basename  07/29/11 0425   HGB  8.9  HCT  27.5    Discharge Exam: General appearance: alert, cooperative and no distress Extremities: Homans sign is negative, no sign of DVT, no edema, redness or tenderness in the calves or thighs and no ulcers, gangrene or trophic changes  Disposition:  Home with follow up in 2 weeks  Follow-up Information    Follow up with OLIN,Dhruvi Crenshaw D in 2 weeks.   Contact information:   [facility] [address], [place] [id number]          Discharge Orders    Future Appointments: Provider: Department: Dept Phone: Center:   08/17/2011 9:00 AM Chesley Mires, MD [facility] [phone] None     Future Orders Please Complete By Expires   Diet - low sodium heart healthy      Call MD / Call 911      Comments:   If you experience chest pain or shortness of breath, CALL 911 and be transported to the hospital emergency room.  If you develope a fever above 101 F, pus (white drainage) or increased drainage or redness at the wound, or calf pain, call your surgeon's office.   Discharge instructions      Comments:   Maintain surgical dressing for  8 days, then replace with gauze and tape. Keep the area dry and clean until follow up. Follow up in 2 weeks at [facility]. Call with any questions or concerns.     Constipation Prevention      Comments:   Drink plenty of fluids.  Prune juice may be helpful.  You may use a stool softener, such as Colace (over the counter) 100 mg twice a day.  Use MiraLax (over the counter) for constipation as needed.   Increase activity slowly as tolerated      Weight Bearing as taught in Physical  Therapy      Comments:   Use a walker or crutches as instructed.   Driving restrictions      Comments:   No driving for 4 weeks   TED hose      Comments:   Use stockings (TED hose) for 2 weeks on both leg(s).  You may remove them at night for sleeping.   Change dressing      Comments:   Maintain surgical dressing for 8 days, then change the dressing daily with sterile 4 x 4 inch gauze dressing and tape. Keep the area dry and clean.      Current Discharge Medication List    START taking these medications   Details  acetaminophen (TYLENOL) 325 MG tablet Take 2 tablets (650 mg total) by mouth every 6 (six) hours as needed (or Fever >/= 101).    aspirin EC 325 MG tablet Take 1 tablet (325 mg total) by mouth 2 (two) times daily. X 4 weeks Qty: 60 tablet, Refills: 0    diphenhydrAMINE (BENADRYL) 25 mg capsule Take 1 capsule (25 mg total) by mouth every 6 (six) hours as needed for itching, allergies or sleep.    docusate sodium 100 MG CAPS Take 100 mg by mouth 2 (two) times daily.    ferrous sulfate 325 (65 FE) MG tablet Take 1 tablet (325 mg total) by mouth 3 (three) times daily after meals.    methocarbamol (ROBAXIN) 500 MG tablet Take 1 tablet (500 mg total) by mouth every 6 (six) hours as needed (muscle spasms).    oxyCODONE (OXY IR/ROXICODONE) 5 MG immediate release tablet Take 1-3 tablets (5-15 mg total) by mouth every 4 (four) hours as needed for pain. Qty: 120 tablet, Refills: 0    polyethylene glycol (MIRALAX / GLYCOLAX) packet Take 17 g by mouth 2 (two) times daily.      CONTINUE these medications which have NOT CHANGED   Details  albuterol (PROVENTIL HFA;VENTOLIN HFA) 108 (90 BASE) MCG/ACT inhaler Inhale 2 puffs into the lungs every 4 (four) hours as needed. For shortness of breath    budesonide-formoterol (SYMBICORT) 160-4.5 MCG/ACT inhaler Inhale 2 puffs into the lungs 2 (two) times daily. Qty: 3 Inhaler, Refills: 3    furosemide (LASIX) 40 MG tablet Take 40 mg by  mouth daily as needed. For swelling    insulin glargine (LANTUS) 100 UNIT/ML injection Inject 100 Units into the skin at bedtime.     !! levothyroxine (SYNTHROID, LEVOTHROID) 150 MCG tablet Take 150 mcg by mouth every morning. Alternates with 171mcg every other day    !! levothyroxine (SYNTHROID, LEVOTHROID) 175 MCG tablet Take 175 mcg by mouth every morning. Alternates with 158mcg every other day.    metoprolol (LOPRESSOR) 50 MG tablet Take 100 mg by mouth 2 (two) times daily.     mometasone (NASONEX) 50 MCG/ACT nasal spray Place 2 sprays into the nose daily as  needed. For congestion    valsartan-hydrochlorothiazide (DIOVAN-HCT) 160-12.5 MG per tablet Take 1 tablet by mouth every morning.     nitroGLYCERIN (NITROSTAT) 0.4 MG SL tablet Place 1 tablet (0.4 mg total) under the tongue every 5 (five) minutes as needed for chest pain. Qty: 25 tablet, Refills: 6     !! - Potential duplicate medications found. Please discuss with provider.    STOP taking these medications     aspirin 81 MG tablet Comments:  Reason for Stopping:       fenofibrate (TRICOR) 145 MG tablet Comments:  Reason for Stopping:       ferrous gluconate (FERGON) 325 MG tablet Comments:  Reason for Stopping:       HYDROcodone-acetaminophen (LORTAB) 7.5-500 MG per tablet Comments:  Reason for Stopping:          Signed: [place]. Lavene Penagos   PAC  07/29/2011, 8:52 AM

## 2011-07-29 NOTE — Progress Notes (Signed)
Physical Therapy Treatment Patient Details Name: Kayla [name] MRN: [id number] DOB: 26-Aug-1940 Today's Date: 07/29/2011  PT Assessment/Plan  PT - Assessment/Plan Comments on Treatment Session: Pt continues to progress well with ambulation and stair negotiation.  Educated both on stair training.  Ready for D/C.  PT Plan: Discharge plan remains appropriate PT Frequency: 7X/week Follow Up Recommendations: Home health PT Equipment Recommended: 3 in 1 bedside comode PT Goals  Acute Rehab PT Goals PT Goal Formulation: With patient/family Time For Goal Achievement: 3 days Pt will go Sit to Stand: with modified independence PT Goal: Sit to Stand - Progress: Met Pt will go Stand to Sit: with modified independence PT Goal: Stand to Sit - Progress: Partly met Pt will Ambulate: >150 feet;with modified independence;with least restrictive assistive device PT Goal: Ambulate - Progress: Partly met Pt will Go Up / Down Stairs: 1-2 stairs;with supervision;with least restrictive assistive device PT Goal: Up/Down Stairs - Progress: Partly met Pt will Perform Home Exercise Program: with supervision, verbal cues required/provided PT Goal: Perform Home Exercise Program - Progress: Met  PT Treatment Precautions/Restrictions  Precautions Precautions: Knee Required Braces or Orthoses: Yes Knee Immobilizer: Discontinue once straight leg raise with < 10 degree lag Restrictions Weight Bearing Restrictions: No Other Position/Activity Restrictions: WBAT Mobility (including Balance) Bed Mobility Bed Mobility: Yes Supine to Sit: 4: Min assist Supine to Sit Details (indicate cue type and reason): Some assist for LLE due to pt all the way on the other side of bed, however PT requested her get out on left side of bed to simulate home environment.  Sit to Supine: 6: Modified independent (Device/Increase time) Transfers Transfers: Yes Sit to Stand: 6: Modified independent (Device/Increase time);With upper  extremity assist;From elevated surface;From bed Stand to Sit: 5: Supervision Stand to Sit Details: Continues to require a VC for reaching for bed and keeping RW with her for safety.  Ambulation/Gait Ambulation/Gait: Yes Ambulation/Gait Assistance: 6: Modified independent (Device/Increase time) Ambulation Distance (Feet): 100 Feet Assistive device: Rolling walker Gait Pattern: Step-to pattern;Trunk flexed Gait velocity: Decreased Stairs: Yes Stairs Assistance: 4: Min assist Stairs Assistance Details (indicate cue type and reason): Min/guard for safety with cues for sequencing/technique with RW.  Educated pt/husband on stabalizing front of RW for safety.  Both demonstrate/verbalize understanding.  Stair Management Technique: No rails;Backwards;Step to pattern;Forwards;With walker Number of Stairs: 2  Height of Stairs: 6  Wheelchair Mobility Wheelchair Mobility: No  Posture/Postural Control Posture/Postural Control: No significant limitations Exercise  Total Joint Exercises Ankle Circles/Pumps: AROM;Both;5 reps;Supine Quad Sets: AROM;Left;5 reps;Supine Short Arc Quad: AROM;Left;5 reps;Supine Heel Slides: AROM;Left;5 reps;Supine Hip ABduction/ADduction: AROM;Left;5 reps;Supine Straight Leg Raises: AROM;Left;5 reps;Supine End of Session PT - End of Session Activity Tolerance: Patient tolerated treatment well Patient left: in bed;with call bell in reach Nurse Communication: Other (comment) (RN aware pt ready for D/C. ) General Behavior During Session: [facility] for tasks performed Cognition: [facility] for tasks performed  Page, Betha Loa 07/29/2011, 9:38 AM

## 2011-07-29 NOTE — Clinical Documentation Improvement (Signed)
Anemia Blood Loss Clarification  THIS DOCUMENT IS NOT A PERMANENT PART OF THE MEDICAL RECORD  RESPOND TO THE THIS QUERY, FOLLOW THE INSTRUCTIONS BELOW:  1. If needed, update documentation for the patient's encounter via the notes activity.  2. Access this query again and click edit on the In [employer].  3. After updating, or not, click F2 to complete all highlighted (required) fields concerning your review. Select "additional documentation in the medical record" OR "no additional documentation provided".  4. Click Sign note button.  5. The deficiency will fall out of your In Basket *Please let us know if you are not able to complete this workflow by phone or e-mail (listed below).        07/29/11  Dear Dr. Alvan Dame Rolley Sims  In an effort to better capture your patient's severity of illness, reflect appropriate length of stay and utilization of resources, a review of the patient medical record has revealed the following indicators.    Based on your clinical judgment, please clarify and document in a progress note and/or discharge summary the clinical condition associated with the following supporting information:  In responding to this query please exercise your independent judgment.  The fact that a query is asked, does not imply that any particular answer is desired or expected.  Pt admitted for L TKA  According to lab pt's H/H=8.9/27.5 post op L TKA   Please clarify based on abnormal H/H whether or not H/H=8.9/27.5 can be further specified as one of the diagnoses listed below and document in pn or d/c summary.    Possible Clinical Conditions?   " Expected Acute Blood Loss Anemia  " Acute Blood Loss Anemia  " Acute on chronic blood loss anemia  " Other Condition________________  " Cannot Clinically Determine  Risk Factors: (recent surgery, pre op anemia, EBL in OR)  Supporting Information: End-stage osteoarthritis, left knee/ CAD/Hypothroidism Signs and Symptoms    Diagnostics: Component     Latest Ref Rng 07/28/2011  HGB     12.0 - 15.0 g/dL 9.4 (L)  HCT     36.0 - 46.0 % 30.1 (L)   Component     Latest Ref Rng 07/29/2011  HGB     12.0 - 15.0 g/dL 8.9 (L)  HCT     36.0 - 46.0 % 27.5 (L)  : Treatments: Serial H&H monitoring Medications  Ferrous Sulfate  Reviewed:  This is not my patient  Thank You,  Heloise Beecham  RN, BSN, CCDS Clinical Documentation Specialist [facility] Dept Pager: [phone] / E-mail: [email or web address]

## 2011-07-29 NOTE — Progress Notes (Signed)
CARE MANAGEMENT NOTE 07/29/2011  Patient:  Kayla [name], Kayla [name]   Account Number:  [id number]  Date Initiated:  07/28/2011  Documentation initiated by:  Sherrin Daisy  Subjective/Objective Assessment:   dx osteoarthritis knee-left; total knee replacement     Action/Plan:   CM spoke with patient and family. Plans are for patient to return to her home in  [place] where spouse and daughter-in-law will be caregivers. She already has RW   Anticipated DC Date:  07/29/2011   Anticipated DC Plan:  [place]  In-house referral  NA      DC Planning Services  CM consult      [facility] Choice  HOME HEALTH   Choice offered to / List presented to:  C-1 Patient   DME arranged  NA      DME agency  NA     [place] arranged  HH-2 PT      [place]   Status of service:  Completed, signed off Medicare Important Message given?  NA - LOS <3 / Initial given by admissions (If response is "NO", the following Medicare IM given date fields will be blank) Date Medicare IM given:   Date Additional Medicare IM given:    Discharge Disposition:  [place]  Per UR Regulation:    If discussed at Long Length of Stay Meetings, dates discussed:    Comments:  07/29/2011 [place] [phone] Pt for discharge today. [facility] Go [facility] services in place with start of services tomorrow 07/30/2011.

## 2011-07-30 DIAGNOSIS — M1710 Unilateral primary osteoarthritis, unspecified knee: Secondary | ICD-10-CM | POA: Diagnosis not present

## 2011-07-30 DIAGNOSIS — J449 Chronic obstructive pulmonary disease, unspecified: Secondary | ICD-10-CM | POA: Diagnosis not present

## 2011-07-30 DIAGNOSIS — IMO0001 Reserved for inherently not codable concepts without codable children: Secondary | ICD-10-CM | POA: Diagnosis not present

## 2011-07-30 DIAGNOSIS — E119 Type 2 diabetes mellitus without complications: Secondary | ICD-10-CM | POA: Diagnosis not present

## 2011-07-30 DIAGNOSIS — I2510 Atherosclerotic heart disease of native coronary artery without angina pectoris: Secondary | ICD-10-CM | POA: Diagnosis not present

## 2011-07-30 DIAGNOSIS — Z471 Aftercare following joint replacement surgery: Secondary | ICD-10-CM | POA: Diagnosis not present

## 2011-07-31 ENCOUNTER — Encounter (HOSPITAL_COMMUNITY): Payer: Self-pay | Admitting: Orthopedic Surgery

## 2011-07-31 DIAGNOSIS — J449 Chronic obstructive pulmonary disease, unspecified: Secondary | ICD-10-CM | POA: Diagnosis not present

## 2011-07-31 DIAGNOSIS — IMO0001 Reserved for inherently not codable concepts without codable children: Secondary | ICD-10-CM | POA: Diagnosis not present

## 2011-07-31 DIAGNOSIS — Z471 Aftercare following joint replacement surgery: Secondary | ICD-10-CM | POA: Diagnosis not present

## 2011-07-31 DIAGNOSIS — I2510 Atherosclerotic heart disease of native coronary artery without angina pectoris: Secondary | ICD-10-CM | POA: Diagnosis not present

## 2011-07-31 DIAGNOSIS — E119 Type 2 diabetes mellitus without complications: Secondary | ICD-10-CM | POA: Diagnosis not present

## 2011-08-03 DIAGNOSIS — IMO0001 Reserved for inherently not codable concepts without codable children: Secondary | ICD-10-CM | POA: Diagnosis not present

## 2011-08-03 DIAGNOSIS — E119 Type 2 diabetes mellitus without complications: Secondary | ICD-10-CM | POA: Diagnosis not present

## 2011-08-03 DIAGNOSIS — Z471 Aftercare following joint replacement surgery: Secondary | ICD-10-CM | POA: Diagnosis not present

## 2011-08-03 DIAGNOSIS — J449 Chronic obstructive pulmonary disease, unspecified: Secondary | ICD-10-CM | POA: Diagnosis not present

## 2011-08-03 DIAGNOSIS — I2510 Atherosclerotic heart disease of native coronary artery without angina pectoris: Secondary | ICD-10-CM | POA: Diagnosis not present

## 2011-08-04 DIAGNOSIS — IMO0001 Reserved for inherently not codable concepts without codable children: Secondary | ICD-10-CM | POA: Diagnosis not present

## 2011-08-04 DIAGNOSIS — E119 Type 2 diabetes mellitus without complications: Secondary | ICD-10-CM | POA: Diagnosis not present

## 2011-08-04 DIAGNOSIS — I2510 Atherosclerotic heart disease of native coronary artery without angina pectoris: Secondary | ICD-10-CM | POA: Diagnosis not present

## 2011-08-04 DIAGNOSIS — J449 Chronic obstructive pulmonary disease, unspecified: Secondary | ICD-10-CM | POA: Diagnosis not present

## 2011-08-04 DIAGNOSIS — Z471 Aftercare following joint replacement surgery: Secondary | ICD-10-CM | POA: Diagnosis not present

## 2011-08-05 DIAGNOSIS — E119 Type 2 diabetes mellitus without complications: Secondary | ICD-10-CM | POA: Diagnosis not present

## 2011-08-05 DIAGNOSIS — I2510 Atherosclerotic heart disease of native coronary artery without angina pectoris: Secondary | ICD-10-CM | POA: Diagnosis not present

## 2011-08-05 DIAGNOSIS — J449 Chronic obstructive pulmonary disease, unspecified: Secondary | ICD-10-CM | POA: Diagnosis not present

## 2011-08-05 DIAGNOSIS — IMO0001 Reserved for inherently not codable concepts without codable children: Secondary | ICD-10-CM | POA: Diagnosis not present

## 2011-08-05 DIAGNOSIS — Z471 Aftercare following joint replacement surgery: Secondary | ICD-10-CM | POA: Diagnosis not present

## 2011-08-06 DIAGNOSIS — I2510 Atherosclerotic heart disease of native coronary artery without angina pectoris: Secondary | ICD-10-CM | POA: Diagnosis not present

## 2011-08-06 DIAGNOSIS — Z471 Aftercare following joint replacement surgery: Secondary | ICD-10-CM | POA: Diagnosis not present

## 2011-08-06 DIAGNOSIS — J449 Chronic obstructive pulmonary disease, unspecified: Secondary | ICD-10-CM | POA: Diagnosis not present

## 2011-08-06 DIAGNOSIS — IMO0001 Reserved for inherently not codable concepts without codable children: Secondary | ICD-10-CM | POA: Diagnosis not present

## 2011-08-06 DIAGNOSIS — E119 Type 2 diabetes mellitus without complications: Secondary | ICD-10-CM | POA: Diagnosis not present

## 2011-08-07 DIAGNOSIS — E119 Type 2 diabetes mellitus without complications: Secondary | ICD-10-CM | POA: Diagnosis not present

## 2011-08-07 DIAGNOSIS — J449 Chronic obstructive pulmonary disease, unspecified: Secondary | ICD-10-CM | POA: Diagnosis not present

## 2011-08-07 DIAGNOSIS — Z471 Aftercare following joint replacement surgery: Secondary | ICD-10-CM | POA: Diagnosis not present

## 2011-08-07 DIAGNOSIS — I2510 Atherosclerotic heart disease of native coronary artery without angina pectoris: Secondary | ICD-10-CM | POA: Diagnosis not present

## 2011-08-07 DIAGNOSIS — IMO0001 Reserved for inherently not codable concepts without codable children: Secondary | ICD-10-CM | POA: Diagnosis not present

## 2011-08-10 DIAGNOSIS — J449 Chronic obstructive pulmonary disease, unspecified: Secondary | ICD-10-CM | POA: Diagnosis not present

## 2011-08-10 DIAGNOSIS — I2510 Atherosclerotic heart disease of native coronary artery without angina pectoris: Secondary | ICD-10-CM | POA: Diagnosis not present

## 2011-08-10 DIAGNOSIS — Z471 Aftercare following joint replacement surgery: Secondary | ICD-10-CM | POA: Diagnosis not present

## 2011-08-10 DIAGNOSIS — IMO0001 Reserved for inherently not codable concepts without codable children: Secondary | ICD-10-CM | POA: Diagnosis not present

## 2011-08-10 DIAGNOSIS — E119 Type 2 diabetes mellitus without complications: Secondary | ICD-10-CM | POA: Diagnosis not present

## 2011-08-11 ENCOUNTER — Telehealth: Payer: Self-pay | Admitting: Cardiology

## 2011-08-11 DIAGNOSIS — I2510 Atherosclerotic heart disease of native coronary artery without angina pectoris: Secondary | ICD-10-CM | POA: Diagnosis not present

## 2011-08-11 DIAGNOSIS — Z471 Aftercare following joint replacement surgery: Secondary | ICD-10-CM | POA: Diagnosis not present

## 2011-08-11 DIAGNOSIS — E119 Type 2 diabetes mellitus without complications: Secondary | ICD-10-CM | POA: Diagnosis not present

## 2011-08-11 DIAGNOSIS — IMO0001 Reserved for inherently not codable concepts without codable children: Secondary | ICD-10-CM | POA: Diagnosis not present

## 2011-08-11 DIAGNOSIS — J449 Chronic obstructive pulmonary disease, unspecified: Secondary | ICD-10-CM | POA: Diagnosis not present

## 2011-08-11 NOTE — Telephone Encounter (Signed)
Advised patient.  Physical therapy will be back out tomorrow to see patient

## 2011-08-11 NOTE — Telephone Encounter (Signed)
Cut the metoprolol back to 75 mg BID and monitor pulse and BP.

## 2011-08-11 NOTE — Telephone Encounter (Signed)
New msg Cindy from [facility] wanted to talk to you about pt's dizziness. Please call back

## 2011-08-11 NOTE — Telephone Encounter (Signed)
Physical therapist out seeing patient for knee replacement.  Heart 42 and then apical of 45.  Carotid pulse was around 60.  blood pressure  122/60. Had walked her some prior to this heart rate and has her up walking now and heart rate up to 60.  Did have oxycodone and Robaxin this is am.  Taking Oxycodone for pain from surgery.  Patient is feeling dizzy  Patient takes Metoprolol 100 mg twice a day  Will forward to Cecille Rubin NP for review (Dr Ron Parker out of office and Cecille Rubin saw patient last)

## 2011-08-12 DIAGNOSIS — J449 Chronic obstructive pulmonary disease, unspecified: Secondary | ICD-10-CM | POA: Diagnosis not present

## 2011-08-12 DIAGNOSIS — Z471 Aftercare following joint replacement surgery: Secondary | ICD-10-CM | POA: Diagnosis not present

## 2011-08-12 DIAGNOSIS — IMO0001 Reserved for inherently not codable concepts without codable children: Secondary | ICD-10-CM | POA: Diagnosis not present

## 2011-08-12 DIAGNOSIS — I10 Essential (primary) hypertension: Secondary | ICD-10-CM | POA: Diagnosis not present

## 2011-08-12 DIAGNOSIS — I498 Other specified cardiac arrhythmias: Secondary | ICD-10-CM | POA: Diagnosis not present

## 2011-08-12 DIAGNOSIS — I2510 Atherosclerotic heart disease of native coronary artery without angina pectoris: Secondary | ICD-10-CM | POA: Diagnosis not present

## 2011-08-12 DIAGNOSIS — E119 Type 2 diabetes mellitus without complications: Secondary | ICD-10-CM | POA: Diagnosis not present

## 2011-08-12 DIAGNOSIS — T8140XA Infection following a procedure, unspecified, initial encounter: Secondary | ICD-10-CM | POA: Diagnosis not present

## 2011-08-14 DIAGNOSIS — J449 Chronic obstructive pulmonary disease, unspecified: Secondary | ICD-10-CM | POA: Diagnosis not present

## 2011-08-14 DIAGNOSIS — I2510 Atherosclerotic heart disease of native coronary artery without angina pectoris: Secondary | ICD-10-CM | POA: Diagnosis not present

## 2011-08-14 DIAGNOSIS — IMO0001 Reserved for inherently not codable concepts without codable children: Secondary | ICD-10-CM | POA: Diagnosis not present

## 2011-08-14 DIAGNOSIS — Z471 Aftercare following joint replacement surgery: Secondary | ICD-10-CM | POA: Diagnosis not present

## 2011-08-14 DIAGNOSIS — E119 Type 2 diabetes mellitus without complications: Secondary | ICD-10-CM | POA: Diagnosis not present

## 2011-08-17 ENCOUNTER — Ambulatory Visit (INDEPENDENT_AMBULATORY_CARE_PROVIDER_SITE_OTHER): Payer: Medicare Other | Admitting: Pulmonary Disease

## 2011-08-17 ENCOUNTER — Encounter: Payer: Self-pay | Admitting: Pulmonary Disease

## 2011-08-17 VITALS — BP 124/78 | HR 71 | Temp 97.4°F | Ht 66.0 in | Wt 246.6 lb

## 2011-08-17 DIAGNOSIS — G4733 Obstructive sleep apnea (adult) (pediatric): Secondary | ICD-10-CM | POA: Diagnosis not present

## 2011-08-17 DIAGNOSIS — J449 Chronic obstructive pulmonary disease, unspecified: Secondary | ICD-10-CM

## 2011-08-17 DIAGNOSIS — E119 Type 2 diabetes mellitus without complications: Secondary | ICD-10-CM | POA: Diagnosis not present

## 2011-08-17 DIAGNOSIS — J310 Chronic rhinitis: Secondary | ICD-10-CM | POA: Diagnosis not present

## 2011-08-17 DIAGNOSIS — M1710 Unilateral primary osteoarthritis, unspecified knee: Secondary | ICD-10-CM | POA: Diagnosis not present

## 2011-08-17 DIAGNOSIS — IMO0001 Reserved for inherently not codable concepts without codable children: Secondary | ICD-10-CM | POA: Diagnosis not present

## 2011-08-17 DIAGNOSIS — I2510 Atherosclerotic heart disease of native coronary artery without angina pectoris: Secondary | ICD-10-CM | POA: Diagnosis not present

## 2011-08-17 DIAGNOSIS — Z471 Aftercare following joint replacement surgery: Secondary | ICD-10-CM | POA: Diagnosis not present

## 2011-08-17 DIAGNOSIS — J4489 Other specified chronic obstructive pulmonary disease: Secondary | ICD-10-CM | POA: Diagnosis not present

## 2011-08-17 MED ORDER — ALBUTEROL SULFATE HFA 108 (90 BASE) MCG/ACT IN AERS: 2.0000 | INHALATION_SPRAY | RESPIRATORY_TRACT | Status: DC | PRN

## 2011-08-17 MED ORDER — BUDESONIDE-FORMOTEROL FUMARATE 160-4.5 MCG/ACT IN AERO: 2.0000 | INHALATION_SPRAY | Freq: Two times a day (BID) | RESPIRATORY_TRACT | Status: DC

## 2011-08-17 NOTE — Assessment & Plan Note (Signed)
Stable with nasonex.

## 2011-08-17 NOTE — Assessment & Plan Note (Signed)
She as been stable on her current inhaler regimen.  Will have yearly follow ups.  She is to call if help needed sooner.

## 2011-08-17 NOTE — Assessment & Plan Note (Signed)
She is compliant and reports benefit from therapy.  She will contact her DME to get new supplies.

## 2011-08-17 NOTE — Progress Notes (Signed)
Addended by: Inge Rise on: 08/17/2011 09:37 AM   Modules accepted: Orders

## 2011-08-17 NOTE — Progress Notes (Signed)
Chief Complaint  Patient presents with  . Follow-up    Pt states her breathing has been okay, occaisonal chest tx. denies any cough,  . Sleep Apnea    Pt states she wears her cpap machine everynight x 6 hrs a night. Pt needs a new hose for cpap machine    History of Present Illness: Kayla [name] is a 71 y.o. female with COPD and OSA using CPAP.  She has been doing well.  She had left knee surgery last month.  She is doing well with rehab.  She does use her albuterol some, and this helps.  She is doing well with CPAP.  She is not having any trouble with her mask or pressure.    Past Medical History  Diagnosis Date  . GERD (gastroesophageal reflux disease)   . Diverticula of colon   . CAD (coronary artery disease)     Mild per remote cath in 2006, /    Nuclear, January, 2013, no significant abnormality,  . Hyperlipidemia   . Pulmonary hypertension, secondary   . Leg edema     occasional swelling  . Goiter     hx of  . Obesity   . Diabetes mellitus, type 2   . Hypothyroidism   . Left ventricular hypertrophy   . Carotid artery disease     Doppler, January, 2013, 0 [id number] RIC A., [id number] LICA, stable  . Dizziness     occasional  . COPD (chronic obstructive pulmonary disease)   . Headache     migraine  . Arthritis   . Complication of anesthesia     wakes up "mean"  . PONV (postoperative nausea and vomiting)   . OSA (obstructive sleep apnea)     cpap setting of 12    Past Surgical History  Procedure Date  . Thyroidectomy 2011  . Back surgery 1990's    lower back  . Total knee arthroplasty 07/27/2011    Procedure: TOTAL KNEE ARTHROPLASTY;  Surgeon: Mauri Pole, MD;  Location: [facility] ORS;  Service: Orthopedics;  Laterality: Left;    Allergies  Allergen Reactions  . Sulfonamide Derivatives Rash    Physical Exam:  Blood pressure 124/78, pulse 71, temperature 97.4 F (36.3 C), temperature source Oral, height 5\' 6"  (1.676 m), weight 246 lb 9.6 oz (111.857 kg), SpO2  95.00%. Body mass index is 39.80 kg/(m^2). Wt Readings from Last 2 Encounters:  08/17/11 246 lb 9.6 oz (111.857 kg)  07/27/11 240 lb (108.863 kg)    General - Obese HEENT - no sinus tenderness, no oral exudate, no LAN Cardiac - s1s2 regular, no murmur Chest - no wheeze/rales Abdomen - soft, nontender Extremities - no edema, well healing Lt knee wound Skin - no rashes Neurologic - normal strength Psychiatric - normal mood, behavior   Assessment/Plan:  Outpatient Encounter Prescriptions as of 08/17/2011  Medication Sig Dispense Refill  . acetaminophen (TYLENOL) 325 MG tablet Take 2 tablets (650 mg total) by mouth every 6 (six) hours as needed (or Fever >/= 101).      Marland Kitchen albuterol (PROVENTIL HFA;VENTOLIN HFA) 108 (90 BASE) MCG/ACT inhaler Inhale 2 puffs into the lungs every 4 (four) hours as needed. For shortness of breath      . amLODipine (NORVASC) 5 MG tablet Once a day      . aspirin EC 325 MG tablet Take 325 mg by mouth 2 (two) times daily. X 4 weeks      . budesonide-formoterol (SYMBICORT) 160-4.5 MCG/ACT inhaler Inhale 2  puffs into the lungs 2 (two) times daily.  3 Inhaler  3  . ferrous sulfate 325 (65 FE) MG tablet Take 1 tablet (325 mg total) by mouth 3 (three) times daily after meals.      . furosemide (LASIX) 40 MG tablet Take 40 mg by mouth daily as needed. For swelling      . insulin glargine (LANTUS) 100 UNIT/ML injection Inject 100 Units into the skin at bedtime.       Marland Kitchen levothyroxine (SYNTHROID, LEVOTHROID) 150 MCG tablet Take 150 mcg by mouth every morning. Alternates with 135mcg every other day      . metoprolol (LOPRESSOR) 50 MG tablet Take 75 mg by mouth 2 (two) times daily.       . mometasone (NASONEX) 50 MCG/ACT nasal spray Place 2 sprays into the nose daily as needed. For congestion      . nitroGLYCERIN (NITROSTAT) 0.4 MG SL tablet Place 1 tablet (0.4 mg total) under the tongue every 5 (five) minutes as needed for chest pain.  25 tablet  6  .  valsartan-hydrochlorothiazide (DIOVAN-HCT) 160-12.5 MG per tablet Take 1 tablet by mouth every morning.       Marland Kitchen DISCONTD: aspirin EC 325 MG tablet Take 1 tablet (325 mg total) by mouth 2 (two) times daily. X 4 weeks  60 tablet  0  . DISCONTD: levothyroxine (SYNTHROID, LEVOTHROID) 175 MCG tablet Take 175 mcg by mouth every morning. Alternates with 172mcg every other day.        Anis Degidio Pager:  [phone] 08/17/2011, 9:18 AM

## 2011-08-17 NOTE — Patient Instructions (Signed)
Follow up in one year Call if help needed sooner

## 2011-08-19 DIAGNOSIS — J449 Chronic obstructive pulmonary disease, unspecified: Secondary | ICD-10-CM | POA: Diagnosis not present

## 2011-08-19 DIAGNOSIS — I2510 Atherosclerotic heart disease of native coronary artery without angina pectoris: Secondary | ICD-10-CM | POA: Diagnosis not present

## 2011-08-19 DIAGNOSIS — IMO0001 Reserved for inherently not codable concepts without codable children: Secondary | ICD-10-CM | POA: Diagnosis not present

## 2011-08-19 DIAGNOSIS — E119 Type 2 diabetes mellitus without complications: Secondary | ICD-10-CM | POA: Diagnosis not present

## 2011-08-19 DIAGNOSIS — Z471 Aftercare following joint replacement surgery: Secondary | ICD-10-CM | POA: Diagnosis not present

## 2011-08-20 DIAGNOSIS — E119 Type 2 diabetes mellitus without complications: Secondary | ICD-10-CM | POA: Diagnosis not present

## 2011-08-20 DIAGNOSIS — IMO0001 Reserved for inherently not codable concepts without codable children: Secondary | ICD-10-CM | POA: Diagnosis not present

## 2011-08-20 DIAGNOSIS — I2510 Atherosclerotic heart disease of native coronary artery without angina pectoris: Secondary | ICD-10-CM | POA: Diagnosis not present

## 2011-08-20 DIAGNOSIS — Z471 Aftercare following joint replacement surgery: Secondary | ICD-10-CM | POA: Diagnosis not present

## 2011-08-20 DIAGNOSIS — J449 Chronic obstructive pulmonary disease, unspecified: Secondary | ICD-10-CM | POA: Diagnosis not present

## 2011-08-21 DIAGNOSIS — M1710 Unilateral primary osteoarthritis, unspecified knee: Secondary | ICD-10-CM | POA: Diagnosis not present

## 2011-08-28 DIAGNOSIS — M1710 Unilateral primary osteoarthritis, unspecified knee: Secondary | ICD-10-CM | POA: Diagnosis not present

## 2011-09-02 DIAGNOSIS — IMO0002 Reserved for concepts with insufficient information to code with codable children: Secondary | ICD-10-CM | POA: Diagnosis not present

## 2011-09-02 DIAGNOSIS — M1710 Unilateral primary osteoarthritis, unspecified knee: Secondary | ICD-10-CM | POA: Diagnosis not present

## 2011-09-09 DIAGNOSIS — M1710 Unilateral primary osteoarthritis, unspecified knee: Secondary | ICD-10-CM | POA: Diagnosis not present

## 2011-09-09 DIAGNOSIS — IMO0002 Reserved for concepts with insufficient information to code with codable children: Secondary | ICD-10-CM | POA: Diagnosis not present

## 2011-09-10 DIAGNOSIS — M1710 Unilateral primary osteoarthritis, unspecified knee: Secondary | ICD-10-CM | POA: Diagnosis not present

## 2011-09-10 DIAGNOSIS — IMO0002 Reserved for concepts with insufficient information to code with codable children: Secondary | ICD-10-CM | POA: Diagnosis not present

## 2011-09-24 DIAGNOSIS — D649 Anemia, unspecified: Secondary | ICD-10-CM | POA: Diagnosis not present

## 2011-09-24 DIAGNOSIS — E1149 Type 2 diabetes mellitus with other diabetic neurological complication: Secondary | ICD-10-CM | POA: Diagnosis not present

## 2011-09-24 DIAGNOSIS — E782 Mixed hyperlipidemia: Secondary | ICD-10-CM | POA: Diagnosis not present

## 2011-09-24 DIAGNOSIS — Z79899 Other long term (current) drug therapy: Secondary | ICD-10-CM | POA: Diagnosis not present

## 2011-09-25 DIAGNOSIS — E890 Postprocedural hypothyroidism: Secondary | ICD-10-CM | POA: Diagnosis not present

## 2011-09-25 DIAGNOSIS — E1149 Type 2 diabetes mellitus with other diabetic neurological complication: Secondary | ICD-10-CM | POA: Diagnosis not present

## 2011-09-25 DIAGNOSIS — I10 Essential (primary) hypertension: Secondary | ICD-10-CM | POA: Diagnosis not present

## 2011-09-25 DIAGNOSIS — E782 Mixed hyperlipidemia: Secondary | ICD-10-CM | POA: Diagnosis not present

## 2011-10-14 DIAGNOSIS — B079 Viral wart, unspecified: Secondary | ICD-10-CM | POA: Diagnosis not present

## 2011-11-12 DIAGNOSIS — R609 Edema, unspecified: Secondary | ICD-10-CM | POA: Diagnosis not present

## 2011-11-12 DIAGNOSIS — E1149 Type 2 diabetes mellitus with other diabetic neurological complication: Secondary | ICD-10-CM | POA: Diagnosis not present

## 2011-11-12 DIAGNOSIS — G589 Mononeuropathy, unspecified: Secondary | ICD-10-CM | POA: Diagnosis not present

## 2011-12-11 DIAGNOSIS — R609 Edema, unspecified: Secondary | ICD-10-CM | POA: Diagnosis not present

## 2011-12-11 DIAGNOSIS — R7309 Other abnormal glucose: Secondary | ICD-10-CM | POA: Diagnosis not present

## 2011-12-11 DIAGNOSIS — R0789 Other chest pain: Secondary | ICD-10-CM | POA: Diagnosis not present

## 2011-12-11 DIAGNOSIS — I498 Other specified cardiac arrhythmias: Secondary | ICD-10-CM | POA: Diagnosis not present

## 2011-12-14 DIAGNOSIS — I498 Other specified cardiac arrhythmias: Secondary | ICD-10-CM | POA: Diagnosis not present

## 2011-12-14 DIAGNOSIS — R609 Edema, unspecified: Secondary | ICD-10-CM | POA: Diagnosis not present

## 2011-12-14 DIAGNOSIS — R7309 Other abnormal glucose: Secondary | ICD-10-CM | POA: Diagnosis not present

## 2011-12-14 DIAGNOSIS — I10 Essential (primary) hypertension: Secondary | ICD-10-CM | POA: Diagnosis not present

## 2011-12-24 DIAGNOSIS — R7309 Other abnormal glucose: Secondary | ICD-10-CM | POA: Diagnosis not present

## 2011-12-24 DIAGNOSIS — R609 Edema, unspecified: Secondary | ICD-10-CM | POA: Diagnosis not present

## 2011-12-24 DIAGNOSIS — I10 Essential (primary) hypertension: Secondary | ICD-10-CM | POA: Diagnosis not present

## 2012-01-05 DIAGNOSIS — J019 Acute sinusitis, unspecified: Secondary | ICD-10-CM | POA: Diagnosis not present

## 2012-01-05 DIAGNOSIS — J441 Chronic obstructive pulmonary disease with (acute) exacerbation: Secondary | ICD-10-CM | POA: Diagnosis not present

## 2012-01-28 DIAGNOSIS — Z79899 Other long term (current) drug therapy: Secondary | ICD-10-CM | POA: Diagnosis not present

## 2012-01-28 DIAGNOSIS — E1149 Type 2 diabetes mellitus with other diabetic neurological complication: Secondary | ICD-10-CM | POA: Diagnosis not present

## 2012-01-28 DIAGNOSIS — E782 Mixed hyperlipidemia: Secondary | ICD-10-CM | POA: Diagnosis not present

## 2012-01-28 DIAGNOSIS — N183 Chronic kidney disease, stage 3 unspecified: Secondary | ICD-10-CM | POA: Diagnosis not present

## 2012-01-28 DIAGNOSIS — D649 Anemia, unspecified: Secondary | ICD-10-CM | POA: Diagnosis not present

## 2012-01-29 DIAGNOSIS — E782 Mixed hyperlipidemia: Secondary | ICD-10-CM | POA: Diagnosis not present

## 2012-01-29 DIAGNOSIS — E1149 Type 2 diabetes mellitus with other diabetic neurological complication: Secondary | ICD-10-CM | POA: Diagnosis not present

## 2012-01-29 DIAGNOSIS — I10 Essential (primary) hypertension: Secondary | ICD-10-CM | POA: Diagnosis not present

## 2012-01-29 DIAGNOSIS — E890 Postprocedural hypothyroidism: Secondary | ICD-10-CM | POA: Diagnosis not present

## 2012-02-10 DIAGNOSIS — IMO0002 Reserved for concepts with insufficient information to code with codable children: Secondary | ICD-10-CM | POA: Diagnosis not present

## 2012-02-10 DIAGNOSIS — M1710 Unilateral primary osteoarthritis, unspecified knee: Secondary | ICD-10-CM | POA: Diagnosis not present

## 2012-02-16 DIAGNOSIS — Z23 Encounter for immunization: Secondary | ICD-10-CM | POA: Diagnosis not present

## 2012-03-02 DIAGNOSIS — L02619 Cutaneous abscess of unspecified foot: Secondary | ICD-10-CM | POA: Diagnosis not present

## 2012-03-08 DIAGNOSIS — S82853A Displaced trimalleolar fracture of unspecified lower leg, initial encounter for closed fracture: Secondary | ICD-10-CM | POA: Diagnosis not present

## 2012-03-18 DIAGNOSIS — E890 Postprocedural hypothyroidism: Secondary | ICD-10-CM | POA: Diagnosis not present

## 2012-03-30 DIAGNOSIS — L03039 Cellulitis of unspecified toe: Secondary | ICD-10-CM | POA: Diagnosis not present

## 2012-04-07 ENCOUNTER — Ambulatory Visit
Admission: RE | Admit: 2012-04-07 | Discharge: 2012-04-07 | Disposition: A | Discharge status: Home / Self Care | Payer: Medicare Other | Source: Ambulatory Visit | Attending: Family Medicine | Admitting: Family Medicine

## 2012-04-07 ENCOUNTER — Other Ambulatory Visit: Payer: Self-pay | Admitting: Family Medicine

## 2012-04-07 DIAGNOSIS — M19079 Primary osteoarthritis, unspecified ankle and foot: Secondary | ICD-10-CM | POA: Diagnosis not present

## 2012-04-07 DIAGNOSIS — IMO0002 Reserved for concepts with insufficient information to code with codable children: Secondary | ICD-10-CM

## 2012-04-11 DIAGNOSIS — H524 Presbyopia: Secondary | ICD-10-CM | POA: Diagnosis not present

## 2012-04-11 DIAGNOSIS — IMO0001 Reserved for inherently not codable concepts without codable children: Secondary | ICD-10-CM | POA: Diagnosis not present

## 2012-05-10 DIAGNOSIS — M79609 Pain in unspecified limb: Secondary | ICD-10-CM | POA: Diagnosis not present

## 2012-06-06 DIAGNOSIS — IMO0002 Reserved for concepts with insufficient information to code with codable children: Secondary | ICD-10-CM | POA: Diagnosis not present

## 2012-06-06 DIAGNOSIS — M1710 Unilateral primary osteoarthritis, unspecified knee: Secondary | ICD-10-CM | POA: Diagnosis not present

## 2012-06-13 DIAGNOSIS — IMO0002 Reserved for concepts with insufficient information to code with codable children: Secondary | ICD-10-CM | POA: Diagnosis not present

## 2012-06-13 DIAGNOSIS — M1710 Unilateral primary osteoarthritis, unspecified knee: Secondary | ICD-10-CM | POA: Diagnosis not present

## 2012-06-20 DIAGNOSIS — M1710 Unilateral primary osteoarthritis, unspecified knee: Secondary | ICD-10-CM | POA: Diagnosis not present

## 2012-06-20 DIAGNOSIS — IMO0002 Reserved for concepts with insufficient information to code with codable children: Secondary | ICD-10-CM | POA: Diagnosis not present

## 2012-06-27 DIAGNOSIS — Z79899 Other long term (current) drug therapy: Secondary | ICD-10-CM | POA: Diagnosis not present

## 2012-06-27 DIAGNOSIS — E890 Postprocedural hypothyroidism: Secondary | ICD-10-CM | POA: Diagnosis not present

## 2012-06-27 DIAGNOSIS — E1149 Type 2 diabetes mellitus with other diabetic neurological complication: Secondary | ICD-10-CM | POA: Diagnosis not present

## 2012-06-27 DIAGNOSIS — E782 Mixed hyperlipidemia: Secondary | ICD-10-CM | POA: Diagnosis not present

## 2012-06-27 DIAGNOSIS — E559 Vitamin D deficiency, unspecified: Secondary | ICD-10-CM | POA: Diagnosis not present

## 2012-06-29 ENCOUNTER — Other Ambulatory Visit: Payer: Self-pay | Admitting: Family Medicine

## 2012-06-29 DIAGNOSIS — M949 Disorder of cartilage, unspecified: Secondary | ICD-10-CM

## 2012-06-29 DIAGNOSIS — N183 Chronic kidney disease, stage 3 unspecified: Secondary | ICD-10-CM | POA: Diagnosis not present

## 2012-06-29 DIAGNOSIS — E1149 Type 2 diabetes mellitus with other diabetic neurological complication: Secondary | ICD-10-CM | POA: Diagnosis not present

## 2012-06-29 DIAGNOSIS — E782 Mixed hyperlipidemia: Secondary | ICD-10-CM | POA: Diagnosis not present

## 2012-06-29 DIAGNOSIS — M899 Disorder of bone, unspecified: Secondary | ICD-10-CM

## 2012-06-29 DIAGNOSIS — I10 Essential (primary) hypertension: Secondary | ICD-10-CM | POA: Diagnosis not present

## 2012-07-01 ENCOUNTER — Other Ambulatory Visit: Payer: Self-pay | Admitting: Family Medicine

## 2012-07-01 ENCOUNTER — Encounter (INDEPENDENT_AMBULATORY_CARE_PROVIDER_SITE_OTHER): Payer: Medicare Other

## 2012-07-01 DIAGNOSIS — I6529 Occlusion and stenosis of unspecified carotid artery: Secondary | ICD-10-CM

## 2012-07-01 DIAGNOSIS — Z1231 Encounter for screening mammogram for malignant neoplasm of breast: Secondary | ICD-10-CM

## 2012-07-07 ENCOUNTER — Other Ambulatory Visit: Payer: Medicare Other

## 2012-07-08 ENCOUNTER — Encounter: Payer: Self-pay | Admitting: Cardiology

## 2012-07-08 DIAGNOSIS — I779 Disorder of arteries and arterioles, unspecified: Secondary | ICD-10-CM | POA: Insufficient documentation

## 2012-07-08 DIAGNOSIS — I739 Peripheral vascular disease, unspecified: Secondary | ICD-10-CM

## 2012-08-02 ENCOUNTER — Ambulatory Visit
Admission: RE | Admit: 2012-08-02 | Discharge: 2012-08-02 | Disposition: A | Discharge status: Home / Self Care | Payer: Medicare Other | Source: Ambulatory Visit | Attending: Family Medicine | Admitting: Family Medicine

## 2012-08-02 DIAGNOSIS — Z1231 Encounter for screening mammogram for malignant neoplasm of breast: Secondary | ICD-10-CM | POA: Diagnosis not present

## 2012-08-02 DIAGNOSIS — M899 Disorder of bone, unspecified: Secondary | ICD-10-CM

## 2012-08-02 DIAGNOSIS — Z780 Asymptomatic menopausal state: Secondary | ICD-10-CM | POA: Diagnosis not present

## 2012-08-03 DIAGNOSIS — IMO0002 Reserved for concepts with insufficient information to code with codable children: Secondary | ICD-10-CM | POA: Diagnosis not present

## 2012-08-03 DIAGNOSIS — M1710 Unilateral primary osteoarthritis, unspecified knee: Secondary | ICD-10-CM | POA: Diagnosis not present

## 2012-08-29 ENCOUNTER — Ambulatory Visit (INDEPENDENT_AMBULATORY_CARE_PROVIDER_SITE_OTHER): Payer: Medicare Other | Admitting: Pulmonary Disease

## 2012-08-29 ENCOUNTER — Encounter: Payer: Self-pay | Admitting: Pulmonary Disease

## 2012-08-29 ENCOUNTER — Ambulatory Visit (INDEPENDENT_AMBULATORY_CARE_PROVIDER_SITE_OTHER)
Admission: RE | Admit: 2012-08-29 | Discharge: 2012-08-29 | Disposition: A | Discharge status: Home / Self Care | Payer: Medicare Other | Source: Ambulatory Visit | Attending: Pulmonary Disease | Admitting: Pulmonary Disease

## 2012-08-29 VITALS — BP 122/62 | HR 65 | Temp 97.9°F | Ht 66.0 in | Wt 258.6 lb

## 2012-08-29 DIAGNOSIS — J449 Chronic obstructive pulmonary disease, unspecified: Secondary | ICD-10-CM

## 2012-08-29 DIAGNOSIS — J310 Chronic rhinitis: Secondary | ICD-10-CM | POA: Diagnosis not present

## 2012-08-29 DIAGNOSIS — R0789 Other chest pain: Secondary | ICD-10-CM | POA: Diagnosis not present

## 2012-08-29 DIAGNOSIS — G4733 Obstructive sleep apnea (adult) (pediatric): Secondary | ICD-10-CM | POA: Diagnosis not present

## 2012-08-29 MED ORDER — UMECLIDINIUM-VILANTEROL 62.5-25 MCG/INH IN AEPB: 1.0000 | INHALATION_SPRAY | Freq: Every day | RESPIRATORY_TRACT | Status: DC

## 2012-08-29 NOTE — Progress Notes (Signed)
Chief Complaint  Patient presents with  . Follow-up    Pt states her breathing has not been good d/t the pollen possibly. She has occasional cough, has some wheezing in late afternoon and chest tx. Pt thinks its time to change inhalers bc it does not seem to help like before.     History of Present Illness: Kayla [name] is a 71 y.o. female with COPD and OSA using CPAP.  She has noticed more trouble with her breathing.  She gets winded with any activity, and can take awhile to recover after she rests.  She has noticed more trouble with wheezing over the past few weeks, and thinks this is from her allergies.  She always has sinus congestion.  She is using nasal irrigation and nasonex.  She uses symbicort twice per day, and proair twice per day.  These help, but not as much as before.  She uses her CPAP every night.  This helps, but she has noticed getting more headaches in the morning.  She denies chest pain, fever, hemoptysis, or leg swelling.  She has used claritin in the past, but has not been using this recently.  TESTS: PSG 02/2005 >> AHI 10  Spirometry 10/19/08 >> FEV1 1.55 (68%), FEV1% 70  Auto-CPAP 02/03/11 to 02/08/11 >> Used on 6 of 6 nights with average 6 hrs 49 min.  Optimal pressure 6 cm H2O with average AHI 1.6. Spirometry 08/29/12 >> FEV1 1.38 (59%), FEV1% 58  She  has a past medical history of GERD (gastroesophageal reflux disease); Diverticula of colon; CAD (coronary artery disease); Hyperlipidemia; Pulmonary hypertension, secondary; Leg edema; Goiter; Obesity; Diabetes mellitus, type 2; Hypothyroidism; Left ventricular hypertrophy; Carotid artery disease; Dizziness; COPD (chronic obstructive pulmonary disease); Headache; Arthritis; Complication of anesthesia; PONV (postoperative nausea and vomiting); and OSA (obstructive sleep apnea).  She  has past surgical history that includes Thyroidectomy (2011); Back surgery (1990's); and Total knee arthroplasty (07/27/2011).  Current  Outpatient Prescriptions on File Prior to Visit  Medication Sig Dispense Refill  . albuterol (PROVENTIL HFA;VENTOLIN HFA) 108 (90 BASE) MCG/ACT inhaler Inhale 2 puffs into the lungs every 4 (four) hours as needed. For shortness of breath  3 Inhaler  3  . amLODipine (NORVASC) 5 MG tablet Once a day      . insulin glargine (LANTUS) 100 UNIT/ML injection Inject 100 Units into the skin at bedtime.       Marland Kitchen levothyroxine (SYNTHROID, LEVOTHROID) 150 MCG tablet Take 75 mcg by mouth daily.       . metoprolol (LOPRESSOR) 50 MG tablet Take 25 mg by mouth 2 (two) times daily.       . mometasone (NASONEX) 50 MCG/ACT nasal spray Place 2 sprays into the nose daily as needed. For congestion      . nitroGLYCERIN (NITROSTAT) 0.4 MG SL tablet Place 1 tablet (0.4 mg total) under the tongue every 5 (five) minutes as needed for chest pain.  25 tablet  6  . valsartan-hydrochlorothiazide (DIOVAN-HCT) 160-12.5 MG per tablet Take 1 tablet by mouth every morning.        No current facility-administered medications on file prior to visit.   No Known Allergies  Physical Exam:  General - Obese HEENT - no sinus tenderness, clear nasal discharge, no oral exudate, no LAN Cardiac - s1s2 regular, no murmur Chest - no wheeze/rales Abdomen - soft, nontender Extremities - no edema Skin - no rashes Neurologic - normal strength Psychiatric - normal mood, behavior  Dg Chest 2 View  08/29/2012  *RADIOLOGY REPORT*  Clinical Data: Progressive shortness of breath, chest tightness, COPD.  CHEST - 2 VIEW  Comparison: 01/21/2011  Findings: Heart is normal size.  No confluent airspace opacities or effusions.  Degenerative changes in the thoracic spine.  IMPRESSION: No active disease.   Original Report Authenticated By: Rolm Baptise, M.D.      Assessment/Plan:  Chesley Mires, MD [place] 09/01/2012, 1:53 AM Pager:  [phone] After 3pm call: [phone]

## 2012-08-29 NOTE — Assessment & Plan Note (Signed)
She reports progressive dyspnea.  Her CXR is unchanged.  She has decrease in FEV1 compared to 2010.    Will have her try anora 1 puff daily.  She is to stop symbicort while using anora.  She is to call back in one week to update status with change to inhalers.  She can continue albuterol as needed.

## 2012-08-29 NOTE — Patient Instructions (Signed)
Try Anoro one puff daily Proair two puffs as needed Do not use symbicort while using anoro Call in one week to update status of breathing symptoms Follow up in 6 months

## 2012-08-29 NOTE — Assessment & Plan Note (Signed)
She can continue nasonex.  Advised her to try using claritin as needed for allergies.

## 2012-08-29 NOTE — Assessment & Plan Note (Signed)
She is compliant, but has been having more trouble with her sleep.  Will arrange for auto CPAP titration.

## 2012-09-05 ENCOUNTER — Telehealth: Payer: Self-pay | Admitting: Pulmonary Disease

## 2012-09-05 MED ORDER — BUDESONIDE-FORMOTEROL FUMARATE 160-4.5 MCG/ACT IN AERO: 2.0000 | INHALATION_SPRAY | Freq: Two times a day (BID) | RESPIRATORY_TRACT | Status: DC

## 2012-09-05 NOTE — Telephone Encounter (Signed)
I spoke with pt and she state the anoro caused her to have HA, made her nervous, cough, shaky. She stated this was right away after she used this medication. She is now finished with the medication and is wanting to know if she can just go back to the symbicort. Please advise Dr. Halford Chessman thanks

## 2012-09-05 NOTE — Telephone Encounter (Signed)
Okay to resume symbicort 160/4.5 two puffs bid.  If pt needs refill of this, then send script for 1 inhaler with 5 refills.  D/C arono from her med list please.

## 2012-09-05 NOTE — Telephone Encounter (Signed)
Pt aware and did not need refills. anora was d/c on medlist. Nothing further was needed

## 2012-10-11 ENCOUNTER — Telehealth: Payer: Self-pay | Admitting: Pulmonary Disease

## 2012-10-11 NOTE — Telephone Encounter (Signed)
Pt is aware of CPAP report results. Nothing further was needed.

## 2012-10-11 NOTE — Telephone Encounter (Signed)
Auto CPAP 09/21/12 to 10/05/12 >> used on 15 of 15 nights with average 10 hrs 22 min.  Average AHI 1.8 with mean CPAP 5 cm H2O and 90 th percentile CPAP 6 cm H2O.  Will have my nurse inform pt that CPAP report looks good.  No change to current set up needed.

## 2012-10-28 DIAGNOSIS — S92309A Fracture of unspecified metatarsal bone(s), unspecified foot, initial encounter for closed fracture: Secondary | ICD-10-CM | POA: Diagnosis not present

## 2012-11-02 DIAGNOSIS — E782 Mixed hyperlipidemia: Secondary | ICD-10-CM | POA: Diagnosis not present

## 2012-11-02 DIAGNOSIS — E559 Vitamin D deficiency, unspecified: Secondary | ICD-10-CM | POA: Diagnosis not present

## 2012-11-02 DIAGNOSIS — Z79899 Other long term (current) drug therapy: Secondary | ICD-10-CM | POA: Diagnosis not present

## 2012-11-02 DIAGNOSIS — E890 Postprocedural hypothyroidism: Secondary | ICD-10-CM | POA: Diagnosis not present

## 2012-11-02 DIAGNOSIS — E1149 Type 2 diabetes mellitus with other diabetic neurological complication: Secondary | ICD-10-CM | POA: Diagnosis not present

## 2012-11-08 DIAGNOSIS — S92309A Fracture of unspecified metatarsal bone(s), unspecified foot, initial encounter for closed fracture: Secondary | ICD-10-CM | POA: Diagnosis not present

## 2012-11-09 DIAGNOSIS — Z9181 History of falling: Secondary | ICD-10-CM | POA: Diagnosis not present

## 2012-11-09 DIAGNOSIS — Z1331 Encounter for screening for depression: Secondary | ICD-10-CM | POA: Diagnosis not present

## 2012-11-09 DIAGNOSIS — Z6841 Body Mass Index (BMI) 40.0 and over, adult: Secondary | ICD-10-CM | POA: Diagnosis not present

## 2012-11-09 DIAGNOSIS — E1149 Type 2 diabetes mellitus with other diabetic neurological complication: Secondary | ICD-10-CM | POA: Diagnosis not present

## 2012-11-28 DIAGNOSIS — S92309A Fracture of unspecified metatarsal bone(s), unspecified foot, initial encounter for closed fracture: Secondary | ICD-10-CM | POA: Diagnosis not present

## 2012-12-02 DIAGNOSIS — IMO0002 Reserved for concepts with insufficient information to code with codable children: Secondary | ICD-10-CM | POA: Diagnosis not present

## 2012-12-02 DIAGNOSIS — M1710 Unilateral primary osteoarthritis, unspecified knee: Secondary | ICD-10-CM | POA: Diagnosis not present

## 2012-12-16 DIAGNOSIS — M25819 Other specified joint disorders, unspecified shoulder: Secondary | ICD-10-CM | POA: Diagnosis not present

## 2012-12-28 DIAGNOSIS — E890 Postprocedural hypothyroidism: Secondary | ICD-10-CM | POA: Diagnosis not present

## 2013-01-09 DIAGNOSIS — IMO0002 Reserved for concepts with insufficient information to code with codable children: Secondary | ICD-10-CM | POA: Diagnosis not present

## 2013-01-09 DIAGNOSIS — M1710 Unilateral primary osteoarthritis, unspecified knee: Secondary | ICD-10-CM | POA: Diagnosis not present

## 2013-01-16 DIAGNOSIS — IMO0002 Reserved for concepts with insufficient information to code with codable children: Secondary | ICD-10-CM | POA: Diagnosis not present

## 2013-01-16 DIAGNOSIS — M1710 Unilateral primary osteoarthritis, unspecified knee: Secondary | ICD-10-CM | POA: Diagnosis not present

## 2013-01-25 ENCOUNTER — Telehealth: Payer: Self-pay | Admitting: Pulmonary Disease

## 2013-01-25 NOTE — Telephone Encounter (Signed)
Okay to double book appointment.

## 2013-01-25 NOTE — Telephone Encounter (Signed)
I spoke with Kayla [name]. She stated her breathing is worse x several weeks. Mostly when she is up moving around. She does have chest tx. She refuses to see any other provider states ONLY Dr. Halford Chessman. Nothing available in October for appts with Dr. Halford Chessman. Please advise thanks  No Known Allergies

## 2013-01-25 NOTE — Telephone Encounter (Signed)
Spoke with the pt and made appt for VS 02/10/13 at 3 pm

## 2013-01-26 DIAGNOSIS — IMO0002 Reserved for concepts with insufficient information to code with codable children: Secondary | ICD-10-CM | POA: Diagnosis not present

## 2013-01-26 DIAGNOSIS — M1710 Unilateral primary osteoarthritis, unspecified knee: Secondary | ICD-10-CM | POA: Diagnosis not present

## 2013-02-10 ENCOUNTER — Ambulatory Visit (INDEPENDENT_AMBULATORY_CARE_PROVIDER_SITE_OTHER): Payer: Medicare Other | Admitting: Pulmonary Disease

## 2013-02-10 ENCOUNTER — Encounter: Payer: Self-pay | Admitting: Pulmonary Disease

## 2013-02-10 VITALS — BP 124/82 | HR 70 | Ht 66.0 in | Wt 259.0 lb

## 2013-02-10 DIAGNOSIS — J310 Chronic rhinitis: Secondary | ICD-10-CM

## 2013-02-10 DIAGNOSIS — Z23 Encounter for immunization: Secondary | ICD-10-CM | POA: Diagnosis not present

## 2013-02-10 DIAGNOSIS — J449 Chronic obstructive pulmonary disease, unspecified: Secondary | ICD-10-CM

## 2013-02-10 DIAGNOSIS — G4733 Obstructive sleep apnea (adult) (pediatric): Secondary | ICD-10-CM

## 2013-02-10 MED ORDER — ACLIDINIUM BROMIDE 400 MCG/ACT IN AEPB: 1.0000 | INHALATION_SPRAY | Freq: Two times a day (BID) | RESPIRATORY_TRACT | Status: DC

## 2013-02-10 MED ORDER — FLUTICASONE PROPIONATE 50 MCG/ACT NA SUSP: 2.0000 | Freq: Every day | NASAL | Status: DC

## 2013-02-10 NOTE — Assessment & Plan Note (Signed)
She continues to have difficulty with her breathing, and using albuterol frequently.  She has been intolerant of spiriva, and did not tolerate trial of anoro.  Will give sample of tudorza.  She is to continue symbicort and prn albuterol.  She is to call in 2 weeks to update status, and inform if she would like to have refill of tudorza.

## 2013-02-10 NOTE — Assessment & Plan Note (Signed)
She is compliant with CPAP.  Recent download shows good control.

## 2013-02-10 NOTE — Assessment & Plan Note (Signed)
Will refill her flonase.

## 2013-02-10 NOTE — Progress Notes (Signed)
Chief Complaint  Patient presents with  . COPD    Breathing is unchanged. Reports SOB. Has issues with first putting on CPAP machine at night.    History of Present Illness: Kayla [name] is a 72 y.o. female with COPD and OSA using CPAP.  She tried anoro.  She was not able to tolerate this.    She still gets winded.  She uses albuterol once or twice per day.  She is using her CPAP.  This helps.  She doesn't feel like she gets enough pressure when she first puts on the mask, but after she falls asleep the pressure setting is better.  TESTS: PSG 02/2005 >> AHI 10  Spirometry 10/19/08 >> FEV1 1.55 (68%), FEV1% 70  Spirometry 08/29/12 >> FEV1 1.38 (59%), FEV1% 58 Auto CPAP 09/21/12 to 10/05/12 >> used on 15 of 15 nights with average 10 hrs 22 min.  Average AHI 1.8 with mean CPAP 5 cm H2O and 90 th percentile CPAP 6 cm H2O.  She  has a past medical history of GERD (gastroesophageal reflux disease); Diverticula of colon; CAD (coronary artery disease); Hyperlipidemia; Pulmonary hypertension, secondary; Leg edema; Goiter; Obesity; Diabetes mellitus, type 2; Hypothyroidism; Left ventricular hypertrophy; Carotid artery disease; Dizziness; COPD (chronic obstructive pulmonary disease); Headache(784.0); Arthritis; Complication of anesthesia; PONV (postoperative nausea and vomiting); and OSA (obstructive sleep apnea).  She  has past surgical history that includes Thyroidectomy (2011); Back surgery (1990's); and Total knee arthroplasty (07/27/2011).  Current Outpatient Prescriptions on File Prior to Visit  Medication Sig Dispense Refill  . albuterol (PROVENTIL HFA;VENTOLIN HFA) 108 (90 BASE) MCG/ACT inhaler Inhale 2 puffs into the lungs every 4 (four) hours as needed. For shortness of breath  3 Inhaler  3  . amLODipine (NORVASC) 5 MG tablet Once a day      . aspirin 81 MG tablet Take 81 mg by mouth daily.      . budesonide-formoterol (SYMBICORT) 160-4.5 MCG/ACT inhaler Inhale 2 puffs into the lungs 2  (two) times daily.  1 Inhaler  6  . insulin glargine (LANTUS) 100 UNIT/ML injection Inject 100 Units into the skin 2 (two) times daily.       . metoprolol (LOPRESSOR) 50 MG tablet Take 25 mg by mouth 2 (two) times daily.       . mometasone (NASONEX) 50 MCG/ACT nasal spray Place 2 sprays into the nose daily as needed. For congestion      . spironolactone (ALDACTONE) 50 MG tablet As needed      . valsartan-hydrochlorothiazide (DIOVAN-HCT) 160-12.5 MG per tablet Take 1 tablet by mouth every morning.       . nitroGLYCERIN (NITROSTAT) 0.4 MG SL tablet Place 1 tablet (0.4 mg total) under the tongue every 5 (five) minutes as needed for chest pain.  25 tablet  6   No current facility-administered medications on file prior to visit.   No Known Allergies  Physical Exam:  General - Obese HEENT - no sinus tenderness, clear nasal discharge, no oral exudate, no LAN Cardiac - s1s2 regular, no murmur Chest - no wheeze/rales Abdomen - soft, nontender Extremities - no edema Skin - no rashes Neurologic - normal strength Psychiatric - normal mood, behavior  Assessment/Plan:  Chesley Mires, MD [place] 02/10/2013, 1:47 PM Pager:  [phone] After 3pm call: [phone]

## 2013-02-10 NOTE — Patient Instructions (Signed)
Tudorza one puff twice per day >> call once sample is finished to update status and decide if you want refill Flu shot today Follow up in 6 months

## 2013-02-13 DIAGNOSIS — E1149 Type 2 diabetes mellitus with other diabetic neurological complication: Secondary | ICD-10-CM | POA: Diagnosis not present

## 2013-02-13 DIAGNOSIS — E782 Mixed hyperlipidemia: Secondary | ICD-10-CM | POA: Diagnosis not present

## 2013-02-13 DIAGNOSIS — E890 Postprocedural hypothyroidism: Secondary | ICD-10-CM | POA: Diagnosis not present

## 2013-02-13 DIAGNOSIS — Z79899 Other long term (current) drug therapy: Secondary | ICD-10-CM | POA: Diagnosis not present

## 2013-02-13 DIAGNOSIS — E559 Vitamin D deficiency, unspecified: Secondary | ICD-10-CM | POA: Diagnosis not present

## 2013-02-20 DIAGNOSIS — E1149 Type 2 diabetes mellitus with other diabetic neurological complication: Secondary | ICD-10-CM | POA: Diagnosis not present

## 2013-02-20 DIAGNOSIS — Z5181 Encounter for therapeutic drug level monitoring: Secondary | ICD-10-CM | POA: Diagnosis not present

## 2013-02-20 DIAGNOSIS — N183 Chronic kidney disease, stage 3 unspecified: Secondary | ICD-10-CM | POA: Diagnosis not present

## 2013-02-20 DIAGNOSIS — I10 Essential (primary) hypertension: Secondary | ICD-10-CM | POA: Diagnosis not present

## 2013-02-20 DIAGNOSIS — E782 Mixed hyperlipidemia: Secondary | ICD-10-CM | POA: Diagnosis not present

## 2013-03-08 ENCOUNTER — Ambulatory Visit: Payer: Medicare Other | Admitting: Podiatry

## 2013-03-08 ENCOUNTER — Encounter: Payer: Self-pay | Admitting: Podiatry

## 2013-03-08 VITALS — BP 142/80 | HR 86 | Resp 96 | Ht 66.0 in | Wt 260.0 lb

## 2013-03-08 DIAGNOSIS — L03039 Cellulitis of unspecified toe: Secondary | ICD-10-CM

## 2013-03-08 DIAGNOSIS — L600 Ingrowing nail: Secondary | ICD-10-CM | POA: Diagnosis not present

## 2013-03-08 DIAGNOSIS — R609 Edema, unspecified: Secondary | ICD-10-CM

## 2013-03-08 NOTE — Patient Instructions (Signed)

## 2013-03-08 NOTE — Progress Notes (Signed)
  Subjective:    Patient ID: Kayla [name], female    DOB: 1940-05-12, 72 y.o.   MRN: [id number]  HPI    Review of Systems  Eyes: Positive for visual disturbance.  Respiratory: Positive for cough and shortness of breath.   Cardiovascular: Positive for leg swelling.  All other systems reviewed and are negative.       Objective:   Physical Exam        Assessment & Plan:

## 2013-03-08 NOTE — Progress Notes (Signed)
N-SORE L-LT FOOT 1ST TOENAIL D- 3 WEEKS O- SLOWLY C- WORSE A-PRESSURE T-NEOSPORIN, PEROXIDE, AND SOAK WITH EPSON SALT

## 2013-03-08 NOTE — Progress Notes (Signed)
Subjective:     Patient ID: Kayla [name], female   DOB: 08-16-1940, 72 y.o.   MRN: [id number]  HPI patient points to left big toenail states it's been bothering her for a while and she wants to get it fixed states she's had 3 different incidents where it has grown out becoming infected and irritated   Review of Systems  All other systems reviewed and are negative.       Objective:   Physical Exam  Nursing note and vitals reviewed. Constitutional: She is oriented to person, place, and time.  Cardiovascular: Intact distal pulses.   Musculoskeletal: Normal range of motion.  Neurological: She is oriented to person, place, and time.  Skin: Skin is warm.   patient is found to have numerous health issues but does have good neurovascular status within the feet. The left hallux nail is thickened incurvated with odor noted. No proximal edema erythema or lymph no distention noted     Assessment:     Paronychia infection left hallux of the localized nature    Plan:     H&P performed and nail removal recommended. We are allow her to regrow and decide about permanent procedure at one point in the future infiltrated 60 mg Xylocaine Marcaine mixture and removed the hallux nail and all proud flesh abscess tissue flushed the bed and applied sterile dressing reappoint a recheck

## 2013-03-16 ENCOUNTER — Telehealth: Payer: Self-pay | Admitting: Pulmonary Disease

## 2013-03-16 MED ORDER — BUDESONIDE-FORMOTEROL FUMARATE 160-4.5 MCG/ACT IN AERO: 2.0000 | INHALATION_SPRAY | Freq: Two times a day (BID) | RESPIRATORY_TRACT | Status: DC

## 2013-03-16 MED ORDER — ALBUTEROL SULFATE HFA 108 (90 BASE) MCG/ACT IN AERS: 2.0000 | INHALATION_SPRAY | RESPIRATORY_TRACT | Status: DC | PRN

## 2013-03-16 NOTE — Telephone Encounter (Signed)
Spoke with pt.  She would like albuterol hfa and symbicort rxs to be sent to [facility].  Both rxs were sent.  Pt aware and voiced no further questions or concerns at this time.

## 2013-03-22 ENCOUNTER — Ambulatory Visit (INDEPENDENT_AMBULATORY_CARE_PROVIDER_SITE_OTHER): Payer: Medicare Other | Admitting: Podiatry

## 2013-03-22 ENCOUNTER — Encounter: Payer: Self-pay | Admitting: Podiatry

## 2013-03-22 VITALS — BP 159/72 | HR 68 | Resp 12

## 2013-03-22 DIAGNOSIS — L03039 Cellulitis of unspecified toe: Secondary | ICD-10-CM | POA: Diagnosis not present

## 2013-03-22 NOTE — Progress Notes (Signed)
Subjective:     Patient ID: Kayla [name], female   DOB: 1940/11/03, 72 y.o.   MRN: [id number]  HPI patient states my left hallux nail is still draining and I wanted to get it checked   Review of Systems     Objective:   Physical Exam    neurovascular status intact with no health history changes and well oriented x3. Left hallux nail is still having some proximal drainage with no erythema edema or lymph node distention Assessment:     Hallux nail left which is gradually healing but slow    Plan:     Explained allowed to air dry and that if symptoms were to get worse at work and in need to consider oral antibiotics. He should will let us know if any other issues should occur or if it does not get better

## 2013-04-22 ENCOUNTER — Other Ambulatory Visit: Payer: Self-pay | Admitting: Pulmonary Disease

## 2013-05-08 DIAGNOSIS — J209 Acute bronchitis, unspecified: Secondary | ICD-10-CM | POA: Diagnosis not present

## 2013-05-08 DIAGNOSIS — J019 Acute sinusitis, unspecified: Secondary | ICD-10-CM | POA: Diagnosis not present

## 2013-05-25 DIAGNOSIS — J189 Pneumonia, unspecified organism: Secondary | ICD-10-CM | POA: Diagnosis not present

## 2013-05-30 ENCOUNTER — Emergency Department (HOSPITAL_COMMUNITY)
Admission: EM | Admit: 2013-05-30 | Discharge: 2013-05-30 | Disposition: A | Discharge status: Home / Self Care | Payer: Medicare Other | Attending: Emergency Medicine | Admitting: Emergency Medicine

## 2013-05-30 ENCOUNTER — Emergency Department (HOSPITAL_COMMUNITY): Payer: Medicare Other

## 2013-05-30 ENCOUNTER — Encounter (HOSPITAL_COMMUNITY): Payer: Self-pay | Admitting: Emergency Medicine

## 2013-05-30 DIAGNOSIS — Z87891 Personal history of nicotine dependence: Secondary | ICD-10-CM | POA: Insufficient documentation

## 2013-05-30 DIAGNOSIS — IMO0002 Reserved for concepts with insufficient information to code with codable children: Secondary | ICD-10-CM | POA: Insufficient documentation

## 2013-05-30 DIAGNOSIS — E669 Obesity, unspecified: Secondary | ICD-10-CM | POA: Insufficient documentation

## 2013-05-30 DIAGNOSIS — E119 Type 2 diabetes mellitus without complications: Secondary | ICD-10-CM

## 2013-05-30 DIAGNOSIS — Z794 Long term (current) use of insulin: Secondary | ICD-10-CM | POA: Diagnosis not present

## 2013-05-30 DIAGNOSIS — J189 Pneumonia, unspecified organism: Secondary | ICD-10-CM | POA: Diagnosis not present

## 2013-05-30 DIAGNOSIS — J40 Bronchitis, not specified as acute or chronic: Secondary | ICD-10-CM

## 2013-05-30 DIAGNOSIS — I2510 Atherosclerotic heart disease of native coronary artery without angina pectoris: Secondary | ICD-10-CM | POA: Insufficient documentation

## 2013-05-30 DIAGNOSIS — J441 Chronic obstructive pulmonary disease with (acute) exacerbation: Secondary | ICD-10-CM | POA: Insufficient documentation

## 2013-05-30 DIAGNOSIS — E785 Hyperlipidemia, unspecified: Secondary | ICD-10-CM | POA: Diagnosis not present

## 2013-05-30 DIAGNOSIS — Z9981 Dependence on supplemental oxygen: Secondary | ICD-10-CM | POA: Diagnosis not present

## 2013-05-30 DIAGNOSIS — R0602 Shortness of breath: Secondary | ICD-10-CM | POA: Diagnosis not present

## 2013-05-30 DIAGNOSIS — Z7982 Long term (current) use of aspirin: Secondary | ICD-10-CM | POA: Insufficient documentation

## 2013-05-30 DIAGNOSIS — G4733 Obstructive sleep apnea (adult) (pediatric): Secondary | ICD-10-CM | POA: Insufficient documentation

## 2013-05-30 DIAGNOSIS — M129 Arthropathy, unspecified: Secondary | ICD-10-CM | POA: Diagnosis not present

## 2013-05-30 DIAGNOSIS — I2789 Other specified pulmonary heart diseases: Secondary | ICD-10-CM | POA: Diagnosis not present

## 2013-05-30 DIAGNOSIS — E039 Hypothyroidism, unspecified: Secondary | ICD-10-CM | POA: Insufficient documentation

## 2013-05-30 DIAGNOSIS — Z79899 Other long term (current) drug therapy: Secondary | ICD-10-CM | POA: Diagnosis not present

## 2013-05-30 DIAGNOSIS — J209 Acute bronchitis, unspecified: Secondary | ICD-10-CM | POA: Diagnosis not present

## 2013-05-30 DIAGNOSIS — Z8719 Personal history of other diseases of the digestive system: Secondary | ICD-10-CM | POA: Insufficient documentation

## 2013-05-30 DIAGNOSIS — R0902 Hypoxemia: Secondary | ICD-10-CM | POA: Diagnosis not present

## 2013-05-30 LAB — BASIC METABOLIC PANEL
BUN: 44 mg/dL — ABNORMAL HIGH (ref 6–23)
CO2: 23 mEq/L (ref 19–32)
Calcium: 8.8 mg/dL (ref 8.4–10.5)
Chloride: 100 mEq/L (ref 96–112)
Creatinine, Ser: 1.53 mg/dL — ABNORMAL HIGH (ref 0.50–1.10)
GFR calc Af Amer: 38 mL/min — ABNORMAL LOW (ref >90)
GFR calc non Af Amer: 33 mL/min — ABNORMAL LOW (ref >90)
Glucose, Bld: 194 mg/dL — ABNORMAL HIGH (ref 70–99)
Potassium: 5.4 mEq/L — ABNORMAL HIGH (ref 3.7–5.3)
Sodium: 140 mEq/L (ref 137–147)

## 2013-05-30 LAB — CBC
HCT: 34.7 % — ABNORMAL LOW (ref 36.0–46.0)
Hemoglobin: 11.2 g/dL — ABNORMAL LOW (ref 12.0–15.0)
MCH: 29.6 pg (ref 26.0–34.0)
MCHC: 32.3 g/dL (ref 30.0–36.0)
MCV: 91.6 fL (ref 78.0–100.0)
Platelets: 330 10*3/uL (ref 150–400)
RBC: 3.79 MIL/uL — ABNORMAL LOW (ref 3.87–5.11)
RDW: 14.2 % (ref 11.5–15.5)
WBC: 7.5 10*3/uL (ref 4.0–10.5)

## 2013-05-30 LAB — GLUCOSE, CAPILLARY: Glucose-Capillary: 158 mg/dL — ABNORMAL HIGH (ref 70–99)

## 2013-05-30 LAB — POCT I-STAT TROPONIN I: Troponin i, poc: 0.01 ng/mL (ref 0.00–0.08)

## 2013-05-30 MED ORDER — IPRATROPIUM BROMIDE 0.02 % IN SOLN
0.5000 mg | Freq: Once | RESPIRATORY_TRACT | Status: AC
Start: 2013-05-30 — End: 2013-05-30
  Administered 2013-05-30: 0.5 mg via RESPIRATORY_TRACT
  Filled 2013-05-30: qty 2.5

## 2013-05-30 MED ORDER — ALBUTEROL SULFATE (2.5 MG/3ML) 0.083% IN NEBU
5.0000 mg | INHALATION_SOLUTION | Freq: Once | RESPIRATORY_TRACT | Status: AC
  Administered 2013-05-30: 5 mg via RESPIRATORY_TRACT
  Filled 2013-05-30: qty 6

## 2013-05-30 MED ORDER — PREDNISONE 20 MG PO TABS
60.0000 mg | ORAL_TABLET | Freq: Once | ORAL | Status: AC
  Administered 2013-05-30: 60 mg via ORAL
  Filled 2013-05-30: qty 3

## 2013-05-30 MED ORDER — PREDNISOLONE SODIUM PHOSPHATE 15 MG/5ML PO SOLN
2.0000 mg/kg | Freq: Once | ORAL | Status: DC
  Filled 2013-05-30: qty 78.6

## 2013-05-30 MED ORDER — ACETAMINOPHEN 325 MG PO TABS
650.0000 mg | ORAL_TABLET | Freq: Once | ORAL | Status: AC
  Administered 2013-05-30: 650 mg via ORAL
  Filled 2013-05-30: qty 2

## 2013-05-30 MED ORDER — PREDNISONE 20 MG PO TABS: 20.0000 mg | ORAL_TABLET | Freq: Two times a day (BID) | ORAL | Status: DC

## 2013-05-30 MED ORDER — IPRATROPIUM BROMIDE 0.02 % IN SOLN
0.5000 mg | Freq: Once | RESPIRATORY_TRACT | Status: AC
  Administered 2013-05-30: 0.5 mg via RESPIRATORY_TRACT
  Filled 2013-05-30: qty 2.5

## 2013-05-30 NOTE — Discharge Instructions (Signed)
Use her nebulizer or inhaler every 3 or 4 hours for trouble breathing, or cough. Increase your insulin by 20% both morning and evening. Keep close track of your sugars, before meals, and at bedtime.  Watch for low blood sugars. If the sugars stay persistently high, such as over 400, either call your doctor or increase your insulin doses by another 10% from the baseline (100 units). Drink plenty of fluids. You can use Robitussin-DM, for cough.    Bronchitis Bronchitis is inflammation of the airways that extend from the windpipe into the lungs (bronchi). The inflammation often causes mucus to develop, which leads to a cough. If the inflammation becomes severe, it may cause shortness of breath. CAUSES  Bronchitis may be caused by:   Viral infections.   Bacteria.   Cigarette smoke.   Allergens, pollutants, and other irritants.  SIGNS AND SYMPTOMS  The most common symptom of bronchitis is a frequent cough that produces mucus. Other symptoms include:  Fever.   Body aches.   Chest congestion.   Chills.   Shortness of breath.   Sore throat.  DIAGNOSIS  Bronchitis is usually diagnosed through a medical history and physical exam. Tests, such as chest X-rays, are sometimes done to rule out other conditions.  TREATMENT  You may need to avoid contact with whatever caused the problem (smoking, for example). Medicines are sometimes needed. These may include:  Antibiotics. These may be prescribed if the condition is caused by bacteria.  Cough suppressants. These may be prescribed for relief of cough symptoms.   Inhaled medicines. These may be prescribed to help open your airways and make it easier for you to breathe.   Steroid medicines. These may be prescribed for those with recurrent (chronic) bronchitis. HOME CARE INSTRUCTIONS  Get plenty of rest.   Drink enough fluids to keep your urine clear or pale yellow (unless you have a medical condition that requires fluid  restriction). Increasing fluids may help thin your secretions and will prevent dehydration.   Only take over-the-counter or prescription medicines as directed by your health care provider.  Only take antibiotics as directed. Make sure you finish them even if you start to feel better.  Avoid secondhand smoke, irritating chemicals, and strong fumes. These will make bronchitis worse. If you are a smoker, quit smoking. Consider using nicotine gum or skin patches to help control withdrawal symptoms. Quitting smoking will help your lungs heal faster.   Put a cool-mist humidifier in your bedroom at night to moisten the air. This may help loosen mucus. Change the water in the humidifier daily. You can also run the hot water in your shower and sit in the bathroom with the door closed for 5 10 minutes.   Follow up with your health care provider as directed.   Wash your hands frequently to avoid catching bronchitis again or spreading an infection to others.  SEEK MEDICAL CARE IF: Your symptoms do not improve after 1 week of treatment.  SEEK IMMEDIATE MEDICAL CARE IF:  Your fever increases.  You have chills.   You have chest pain.   You have worsening shortness of breath.   You have bloody sputum.  You faint.  You have lightheadedness.  You have a severe headache.   You vomit repeatedly. MAKE SURE YOU:   Understand these instructions.  Will watch your condition.  Will get help right away if you are not doing well or get worse. Document Released: 04/20/2005 Document Revised: 02/08/2013 Document Reviewed: 12/13/2012 [employer] Patient  Information ©2014 [employer]. ° °

## 2013-05-30 NOTE — ED Provider Notes (Signed)
CSN: [id number]     Arrival date & time 05/30/13  1407 History   First MD Initiated Contact with Patient 05/30/13 1518     Chief Complaint  Patient presents with  . Shortness of Breath   (Consider location/radiation/quality/duration/timing/severity/associated sxs/prior Treatment) The history is provided by the patient.   Kayla [name] is a 73 y.o. female who presents for evaluation of shortness of breath and hypoxia. [facility] office today. Her oxygen saturation was 83%. Improved with oxygen by nasal cannula. He presents here for evaluation by private vehicle for these problems. On arrival her oxygen saturation was 93% on room air. She had a cough with fever and wad diagnosed with pneumonia 3 weeks ago, and was treated with a course of Levaquin. She went back to her doctor about 10 days ago, and a trial of prednisone was initiated for persistent cough. She had to stop the prednisone because of hyperglycemia. She was started on albuterol nebulizer 6 days ago. This has helped her somewhat. She was placed on oxygen in the emergency department, and feels better now. There are no other known modifying factors.   Past Medical History  Diagnosis Date  . GERD (gastroesophageal reflux disease)   . Diverticula of colon   . CAD (coronary artery disease)     Mild per remote cath in 2006, /    Nuclear, January, 2013, no significant abnormality,  . Hyperlipidemia   . Pulmonary hypertension, secondary   . Leg edema     occasional swelling  . Goiter     hx of  . Obesity   . Diabetes mellitus, type 2   . Hypothyroidism   . Left ventricular hypertrophy   . Carotid artery disease     Doppler, January, 2013, 0 [id number] RIC A., [id number] LICA, stable  . Dizziness     occasional  . COPD (chronic obstructive pulmonary disease)   . Headache(784.0)     migraine  . Arthritis   . Complication of anesthesia     wakes up "mean"  . PONV (postoperative nausea and vomiting)   . OSA (obstructive sleep apnea)    cpap setting of 12   Past Surgical History  Procedure Laterality Date  . Thyroidectomy  2011  . Back surgery  1990's    lower back  . Total knee arthroplasty  07/27/2011    Procedure: TOTAL KNEE ARTHROPLASTY;  Surgeon: Mauri Pole, MD;  Location: [facility];  Service: Orthopedics;  Laterality: Left;   Family History  Problem Relation Age of Onset  . Coronary artery disease     History  Substance Use Topics  . Smoking status: Former Smoker -- 1.50 packs/day for 34 years    Types: Cigarettes    Quit date: 05/04/1997  . Smokeless tobacco: Never Used  . Alcohol Use: No   OB History   Grav Para Term Preterm Abortions TAB SAB Ect Mult Living                 Review of Systems  All other systems reviewed and are negative.    Allergies  Review of patient's allergies indicates no known allergies.  Home Medications   Current Outpatient Rx  Name  Route  Sig  Dispense  Refill  . Aclidinium Bromide (TUDORZA PRESSAIR) 400 MCG/ACT AEPB   Inhalation   Inhale 1 puff into the lungs 2 (two) times daily.   1 each   5   . albuterol (PROVENTIL HFA;VENTOLIN HFA) 108 (90 BASE) MCG/ACT inhaler  Inhalation   Inhale 2 puffs into the lungs every 4 (four) hours as needed. For shortness of breath   3 Inhaler   1   . amLODipine (NORVASC) 5 MG tablet      Once a day         . aspirin 81 MG tablet   Oral   Take 81 mg by mouth daily.         . budesonide-formoterol (SYMBICORT) 160-4.5 MCG/ACT inhaler   Inhalation   Inhale 2 puffs into the lungs 2 (two) times daily.   3 Inhaler   1   . divalproex (DEPAKOTE ER) 500 MG 24 hr tablet   Oral   Take 1,000 mg by mouth at bedtime.          . fluticasone (FLONASE) 50 MCG/ACT nasal spray      USE 2 SPRAYS NASALLY DAILY   16 g   1   . gabapentin (NEURONTIN) 600 MG tablet   Oral   Take 600 mg by mouth 3 (three) times daily.          . insulin glargine (LANTUS) 100 UNIT/ML injection   Subcutaneous   Inject 100 Units into the  skin 2 (two) times daily.          Marland Kitchen levothyroxine (SYNTHROID, LEVOTHROID) 200 MCG tablet   Oral   Take 200 mcg by mouth daily before breakfast.         . metFORMIN (GLUCOPHAGE-XR) 500 MG 24 hr tablet   Oral   Take 2 tablets by mouth 2 (two) times daily.         . metoprolol (LOPRESSOR) 50 MG tablet   Oral   Take 25 mg by mouth daily.          . simvastatin (ZOCOR) 20 MG tablet   Oral   Take 1 tablet by mouth daily.         Marland Kitchen spironolactone (ALDACTONE) 50 MG tablet      As needed         . TRICOR 145 MG tablet   Oral   Take 1 tablet by mouth daily.         . valsartan-hydrochlorothiazide (DIOVAN-HCT) 160-12.5 MG per tablet   Oral   Take 1 tablet by mouth every morning.          Marland Kitchen EXPIRED: nitroGLYCERIN (NITROSTAT) 0.4 MG SL tablet   Sublingual   Place 1 tablet (0.4 mg total) under the tongue every 5 (five) minutes as needed for chest pain.   25 tablet   6   . predniSONE (DELTASONE) 20 MG tablet   Oral   Take 1 tablet (20 mg total) by mouth 2 (two) times daily.   10 tablet   0    BP 116/48  Pulse 69  Temp(Src) 97.8 F (36.6 C) (Oral)  Resp 14  Wt 259 lb 14.8 oz (117.9 kg)  SpO2 99% Physical Exam  Nursing note and vitals reviewed. Constitutional: She is oriented to person, place, and time. She appears well-developed.  Elderly, obese  HENT:  Head: Normocephalic and atraumatic.  Eyes: Conjunctivae and EOM are normal. Pupils are equal, round, and reactive to light.  Neck: Normal range of motion and phonation normal. Neck supple.  Cardiovascular: Normal rate, regular rhythm and intact distal pulses.   Pulmonary/Chest: Effort normal. She exhibits no tenderness.  Somewhat decreased air movement bilaterally with scattered wheezes. No rhonchi, or rales. No increased work of breathing.  Abdominal: Soft. She exhibits  no distension. There is no tenderness. There is no guarding.  Musculoskeletal: Normal range of motion. She exhibits no edema and no  tenderness.  Neurological: She is alert and oriented to person, place, and time. She exhibits normal muscle tone.  Skin: Skin is warm and dry.  Psychiatric: She has a normal mood and affect. Her behavior is normal. Judgment and thought content normal.    ED Course  Procedures (including critical care time) Medications  albuterol (PROVENTIL) (2.5 MG/3ML) 0.083% nebulizer solution 5 mg (5 mg Nebulization Given 05/30/13 1601)  ipratropium (ATROVENT) nebulizer solution 0.5 mg (0.5 mg Nebulization Given 05/30/13 1601)  predniSONE (DELTASONE) tablet 60 mg (60 mg Oral Given 05/30/13 1655)  albuterol (PROVENTIL) (2.5 MG/3ML) 0.083% nebulizer solution 5 mg (5 mg Nebulization Given 05/30/13 1848)  ipratropium (ATROVENT) nebulizer solution 0.5 mg (0.5 mg Nebulization Given 05/30/13 1848)  acetaminophen (TYLENOL) tablet 650 mg (650 mg Oral Given 05/30/13 1908)    Patient Vitals for the past 24 hrs:  BP Temp Temp src Pulse Resp SpO2 Weight  05/30/13 1900 116/48 mmHg - - 69 14 99 % -  05/30/13 1830 126/41 mmHg - - 53 18 90 % -  05/30/13 1811 111/56 mmHg - - 71 19 93 % -  05/30/13 1730 130/58 mmHg - - 72 25 90 % -  05/30/13 1715 124/48 mmHg 97.8 F (36.6 C) Oral 73 20 93 % -  05/30/13 1657 124/48 mmHg - - 43 20 92 % -  05/30/13 1602 - - - - - - 259 lb 14.8 oz (117.9 kg)  05/30/13 1600 115/50 mmHg - - 58 17 96 % -  05/30/13 1445 114/48 mmHg - - 61 11 96 % -  05/30/13 1444 - - - - - 96 % -  05/30/13 1438 118/82 mmHg 97.6 F (36.4 C) Oral 64 - 97 % -  05/30/13 1415 114/78 mmHg 98.3 F (36.8 C) Oral 61 18 93 % -    8:46 PM Reevaluation with update and discussion. After initial assessment and treatment, an updated evaluation reveals she feels better. She is able to ambulate with O2 sat 93% on RA. Findings discussed with pt, and questions answered.Daleen Bo L     Labs Review Labs Reviewed  BASIC METABOLIC PANEL - Abnormal; Notable for the following:    Potassium 5.4 (*)    Glucose, Bld 194 (*)     BUN 44 (*)    Creatinine, Ser 1.53 (*)    GFR calc non Af Amer 33 (*)    GFR calc Af Amer 38 (*)    All other components within normal limits  CBC - Abnormal; Notable for the following:    RBC 3.79 (*)    Hemoglobin 11.2 (*)    HCT 34.7 (*)    All other components within normal limits  GLUCOSE, CAPILLARY - Abnormal; Notable for the following:    Glucose-Capillary 158 (*)    All other components within normal limits  POCT I-STAT TROPONIN I   Imaging Review Dg Chest 2 View (if Patient Has Fever And/or Copd)  05/30/2013   CLINICAL DATA:  Short of breath, weakness  EXAM: CHEST  2 VIEW  COMPARISON:  Prior chest x-ray 08/29/2012  FINDINGS: The lungs are clear and negative for focal airspace consolidation, pulmonary edema or suspicious pulmonary nodule. Stable mild hyperinflation, chronic bronchitic changes and prominence of the right basilar interstitial markings compared to prior. No pleural effusion or pneumothorax. Cardiac and mediastinal contours are within normal limits. No  acute fracture or lytic or blastic osseous lesions. Multilevel degenerative spurring throughout the thoracic spine. The visualized upper abdominal bowel gas pattern is unremarkable.  IMPRESSION: Stable chest x-ray without evidence of acute cardiopulmonary process.   Electronically Signed   By: Jacqulynn Cadet M.D.   On: 05/30/2013 15:12    EKG Interpretation    Date/Time:  Tuesday May 30 2013 14:13:20 EST Ventricular Rate:  63 PR Interval:  182 QRS Duration: 88 QT Interval:  410 QTC Calculation: 419 R Axis:   40 Text Interpretation:  Normal sinus rhythm Normal ECG since last tracing no significant change Confirmed by Brandonlee Navis  MD, Gera Inboden ([id number]) on 05/30/2013 3:07:51 PM            MDM   1. Bronchitis   2. Diabetes    Bronchitis with hypoxia that improved with treatment with nebulizer. Doubt pneumonia doubt PE, serious bacterial infection or metabolic instability. She can be successfully treated as  an outpatient by using prednisone and decreasing her daily insulin dosing.  Nursing Notes Reviewed/ Care Coordinated Applicable Imaging Reviewed Interpretation of Laboratory Data incorporated into ED treatment  The patient appears reasonably screened and/or stabilized for discharge and I doubt any other medical condition or other [facility] requiring further screening, evaluation, or treatment in the ED at this time prior to discharge.  Plan: Home Medications- Prednisone; Home Treatments- rest; return here if the recommended treatment, does not improve the symptoms; Recommended follow up- PCP prn  Richarda Blade, MD 05/30/13 2050

## 2013-05-30 NOTE — ED Notes (Signed)
Pt. Is a diabetic and previously prednisone for a 2-3 days. Pt took cbg this am 150s and covered self with insulin. Will recheck.

## 2013-05-30 NOTE — ED Notes (Signed)
Pt here for sob, onset over the past week, sent here from md in [place] from low pulse ox, on arrival here sats 92%,

## 2013-05-30 NOTE — ED Notes (Signed)
Pt ambulated to BR.  Pt reports shortness of breath now is no worse than it has been in the past several weeks.  O2 Sat when returned to room 93-94% on room air.

## 2013-06-21 ENCOUNTER — Other Ambulatory Visit: Payer: Self-pay | Admitting: Pulmonary Disease

## 2013-07-05 ENCOUNTER — Other Ambulatory Visit: Payer: Self-pay | Admitting: Family Medicine

## 2013-07-05 DIAGNOSIS — E782 Mixed hyperlipidemia: Secondary | ICD-10-CM | POA: Diagnosis not present

## 2013-07-05 DIAGNOSIS — Z9181 History of falling: Secondary | ICD-10-CM | POA: Diagnosis not present

## 2013-07-05 DIAGNOSIS — E890 Postprocedural hypothyroidism: Secondary | ICD-10-CM | POA: Diagnosis not present

## 2013-07-05 DIAGNOSIS — D649 Anemia, unspecified: Secondary | ICD-10-CM | POA: Diagnosis not present

## 2013-07-05 DIAGNOSIS — Z1231 Encounter for screening mammogram for malignant neoplasm of breast: Secondary | ICD-10-CM

## 2013-07-05 DIAGNOSIS — Z79899 Other long term (current) drug therapy: Secondary | ICD-10-CM | POA: Diagnosis not present

## 2013-07-05 DIAGNOSIS — Z6841 Body Mass Index (BMI) 40.0 and over, adult: Secondary | ICD-10-CM | POA: Diagnosis not present

## 2013-07-05 DIAGNOSIS — I10 Essential (primary) hypertension: Secondary | ICD-10-CM | POA: Diagnosis not present

## 2013-07-05 DIAGNOSIS — Z1331 Encounter for screening for depression: Secondary | ICD-10-CM | POA: Diagnosis not present

## 2013-07-05 DIAGNOSIS — E1149 Type 2 diabetes mellitus with other diabetic neurological complication: Secondary | ICD-10-CM | POA: Diagnosis not present

## 2013-07-20 DIAGNOSIS — J449 Chronic obstructive pulmonary disease, unspecified: Secondary | ICD-10-CM | POA: Diagnosis not present

## 2013-07-31 ENCOUNTER — Ambulatory Visit (HOSPITAL_COMMUNITY): Payer: Medicare Other | Attending: Cardiology | Admitting: Radiology

## 2013-07-31 ENCOUNTER — Ambulatory Visit (HOSPITAL_BASED_OUTPATIENT_CLINIC_OR_DEPARTMENT_OTHER): Payer: Medicare Other | Admitting: Cardiology

## 2013-07-31 ENCOUNTER — Encounter: Payer: Self-pay | Admitting: Cardiology

## 2013-07-31 ENCOUNTER — Encounter: Payer: Self-pay | Admitting: Physician Assistant

## 2013-07-31 ENCOUNTER — Other Ambulatory Visit: Payer: Self-pay

## 2013-07-31 ENCOUNTER — Ambulatory Visit (INDEPENDENT_AMBULATORY_CARE_PROVIDER_SITE_OTHER): Payer: Medicare Other | Admitting: Physician Assistant

## 2013-07-31 VITALS — BP 130/50 | HR 72 | Ht 66.0 in | Wt 259.0 lb

## 2013-07-31 DIAGNOSIS — I6529 Occlusion and stenosis of unspecified carotid artery: Secondary | ICD-10-CM | POA: Diagnosis not present

## 2013-07-31 DIAGNOSIS — R42 Dizziness and giddiness: Secondary | ICD-10-CM

## 2013-07-31 DIAGNOSIS — R0602 Shortness of breath: Secondary | ICD-10-CM | POA: Diagnosis not present

## 2013-07-31 DIAGNOSIS — R002 Palpitations: Secondary | ICD-10-CM | POA: Diagnosis not present

## 2013-07-31 DIAGNOSIS — R51 Headache: Secondary | ICD-10-CM

## 2013-07-31 DIAGNOSIS — R079 Chest pain, unspecified: Secondary | ICD-10-CM | POA: Diagnosis not present

## 2013-07-31 NOTE — Progress Notes (Signed)
Echocardiogram performed.  

## 2013-07-31 NOTE — Progress Notes (Signed)
Carotid Duplex performed. 

## 2013-07-31 NOTE — Assessment & Plan Note (Signed)
Stable

## 2013-07-31 NOTE — Assessment & Plan Note (Signed)
Patient has history of nonobstructive CAD on cath in 2006. Last Lexi scan Myoview was in 2013. She is having some chest tightness when she moves around. We will check another Lexi scan to rule out ischemia. I suspect most of this is due to to her COPD.

## 2013-07-31 NOTE — Assessment & Plan Note (Signed)
Followed by her primary

## 2013-07-31 NOTE — Assessment & Plan Note (Signed)
Checking 2-D echo and Holter monitor. Patient is also seeing her neurosurgeon tomorrow.

## 2013-07-31 NOTE — Patient Instructions (Addendum)
Your physician has requested that you have an echocardiogram. Echocardiography is a painless test that uses sound waves to create images of your heart. It provides your doctor with information about the size and shape of your heart and how well your heart's chambers and valves are working. This procedure takes approximately one hour. There are no restrictions for this procedure.  Your physician has requested that you have a carotid doppler. This test is an ultrasound of the carotid arteries in your neck. It looks at blood flow through these arteries that supply the brain with blood. Allow one hour for this exam. There are no restrictions or special instructions.  Your physician has recommended that you wear a 48 holter monitor. Holter monitors are medical devices that record the heart's electrical activity. Doctors most often use these monitors to diagnose arrhythmias. Arrhythmias are problems with the speed or rhythm of the heartbeat. The monitor is a small, portable device. You can wear one while you do your normal daily activities. This is usually used to diagnose what is causing palpitations/syncope (passing out).  Your physician has requested that you have a lexiscan myoview. Please follow instruction sheet, as given.  Your physician recommends that you schedule a follow-up appointment in: [place] DR. KATZ AFTER PROCEDURE   PROVIDER WOULD LIKE FOR YOU TO FOLLOW UP WITH YOUR PULMONARY DOCTOR

## 2013-07-31 NOTE — Assessment & Plan Note (Signed)
Check 2D echo 

## 2013-07-31 NOTE — Assessment & Plan Note (Signed)
I suspect a lot of her problems are due to her COPD. I have asked her to follow up with her pulmonologist.

## 2013-07-31 NOTE — Assessment & Plan Note (Signed)
Recheck carotid Dopplers

## 2013-07-31 NOTE — Assessment & Plan Note (Addendum)
Patient companion for of one month history of worsening palpitations that occur every day. She has a skipping and some racing. Difficult to know if this is causing her dyspnea to become worse or vice versa. We'll check a 48-hour monitor to rule out arrhythmia. It could also be due to increase use of Ventolin inhaler. Patient also takes Synthroid but had lab work 3-4 weeks ago by her primary care Dr. Lisbeth Ply in [place]. Patient states it was stable.

## 2013-07-31 NOTE — Progress Notes (Signed)
HPI:  This is a 73 year old female patient of Dr. Dola Argyle who has not been seen by him in several years. She has a history of nonobstructive coronary disease on cath in 2006. She had a rest Lexi scan Myoview on 05/14/11 that was low risk.  Patient comes in today complaining of several month history of worsening dyspnea and palpitations. She says her heart skips every evening starting about 4:00. It's associated with worsening shortness of breath. She doesn't know if her lungs are causing the skipping or vice versa. Sometimes her heart races but usually just skips. She has chronic dyspnea on exertion and occasionally will get chest tightness when she's moving around. This eases quickly with resting. She has no radiation of pain. She has chronic dizziness that she describes as a seasickness feeling associated with severe headaches and occurs when she's walking around. She is scheduled to see Dr. Weston Settle her neurosurgeon tomorrow. She was told she has a spur on her spine. Patient hasn't seen her pulmonologist since November. She was in the emergency room in January 2015 with bronchitis. She is using her Ventolin inhaler more frequently during the day.  No Known Allergies  Current Outpatient Prescriptions on File Prior to Visit: Aclidinium Bromide (TUDORZA PRESSAIR) 400 MCG/ACT AEPB, Inhale 1 puff into the lungs 2 (two) times daily., Disp: 1 each, Rfl: 5 albuterol (PROVENTIL HFA;VENTOLIN HFA) 108 (90 BASE) MCG/ACT inhaler, Inhale 2 puffs into the lungs every 4 (four) hours as needed. For shortness of breath, Disp: 3 Inhaler, Rfl: 1 amLODipine (NORVASC) 5 MG tablet, Once a day, Disp: , Rfl:  aspirin 81 MG tablet, Take 81 mg by mouth daily., Disp: , Rfl:  budesonide-formoterol (SYMBICORT) 160-4.5 MCG/ACT inhaler, Inhale 2 puffs into the lungs 2 (two) times daily., Disp: 3 Inhaler, Rfl: 1 divalproex (DEPAKOTE ER) 500 MG 24 hr tablet, Take 1,000 mg by mouth at bedtime. , Disp: , Rfl:  fluticasone (FLONASE) 50  MCG/ACT nasal spray, USE 2 SPRAYS NASALLY DAILY, Disp: 16 g, Rfl: 2 gabapentin (NEURONTIN) 600 MG tablet, Take 600 mg by mouth 3 (three) times daily. , Disp: , Rfl:  insulin glargine (LANTUS) 100 UNIT/ML injection, Inject 100 Units into the skin 2 (two) times daily. , Disp: , Rfl:  levothyroxine (SYNTHROID, LEVOTHROID) 200 MCG tablet, Take 200 mcg by mouth daily before breakfast., Disp: , Rfl:  metFORMIN (GLUCOPHAGE-XR) 500 MG 24 hr tablet, Take 2 tablets by mouth 2 (two) times daily., Disp: , Rfl:  metoprolol (LOPRESSOR) 50 MG tablet, Take 25 mg by mouth daily. , Disp: , Rfl:  predniSONE (DELTASONE) 20 MG tablet, Take 1 tablet (20 mg total) by mouth 2 (two) times daily., Disp: 10 tablet, Rfl: 0 simvastatin (ZOCOR) 20 MG tablet, Take 1 tablet by mouth daily., Disp: , Rfl:  spironolactone (ALDACTONE) 50 MG tablet, As needed, Disp: , Rfl:  TRICOR 145 MG tablet, Take 1 tablet by mouth daily., Disp: , Rfl:  nitroGLYCERIN (NITROSTAT) 0.4 MG SL tablet, Place 1 tablet (0.4 mg total) under the tongue every 5 (five) minutes as needed for chest pain., Disp: 25 tablet, Rfl: 6  No current facility-administered medications on file prior to visit.   Past Medical History:   GERD (gastroesophageal reflux disease)                       Diverticula of colon  CAD (coronary artery disease)                                  Comment:Mild per remote cath in 2006, /    Nuclear,               January, 2013, no significant abnormality,   Hyperlipidemia                                               Pulmonary hypertension, secondary                            Leg edema                                                      Comment:occasional swelling   Goiter                                                         Comment:hx of   Obesity                                                      Diabetes mellitus, type 2                                    Hypothyroidism                                                Left ventricular hypertrophy                                 Carotid artery disease                                         Comment:Doppler, January, 2013, 0 39% RIC A., [id number]               LICA, stable   Dizziness                                                      Comment:occasional   COPD (chronic obstructive pulmonary disease)                 Headache(784.0)  Comment:migraine   Arthritis                                                    Complication of anesthesia                                     Comment:wakes up "mean"   PONV (postoperative nausea and vomiting)                     OSA (obstructive sleep apnea)                                  Comment:cpap setting of 12  Past Surgical History:   THYROIDECTOMY                                    2011         BACK SURGERY                                     1990's         Comment:lower back   TOTAL KNEE ARTHROPLASTY                          07/27/2011      Comment:Procedure: TOTAL KNEE ARTHROPLASTY;  Surgeon:               Mauri Pole, MD;  Location: [facility];                Service: Orthopedics;  Laterality: Left;  Review of patient's family history indicates:   Coronary artery disease                                 Social History   Marital Status: Married             Spouse Name:                      Years of Education:                 Number of children:             Occupational History Occupation          Fish farm manager            Comment              retired                                   Social History Main Topics   Smoking Status: Former Smoker                   Packs/Day: 1.50  Years: 34        Types: Cigarettes     Quit date: 05/04/1997   Smokeless Status: Never Used  Alcohol Use: No             Drug Use: No             Sexual Activity: Not on file        Other Topics            Concern   None on file  Social  History Narrative   None on file    ROS: See history of present illness otherwise negative   PHYSICAL EXAM: Obese, in no acute distress. Neck: Bilateral carotid bruits, No JVD, HJR, or thyroid enlargement  Lungs: Decreased breath sounds throughout but without wheezing, rales, or rhonchi  Cardiovascular: RRR, PMI not displaced, heart sounds distant, [id number] systolic murmur at the right and left sternal borders, no gallops, bruit, thrill, or heave.  Abdomen: BS normal. Soft without organomegaly, masses, lesions or tenderness.  Extremities: without cyanosis, clubbing or edema. Good distal pulses bilateral  SKin: Warm, no lesions or rashes   Musculoskeletal: No deformities  Neuro: no focal signs  BP 130/50  Pulse 72  Ht 5\' 6"  (1.676 m)  Wt 259 lb (117.482 kg)  BMI 41.82 kg/m2  SpO2 96%   EKG: Normal sinus rhythm, normal EKG  Rest Lexi scan 2013 Overall Impression:  Low risk stress nuclear study.  Mild fixed mid anteroseptal and basal to mid inferior perfusion defects with normal wall motion.  Similar pattern on prior myoview.  Suspect this represents attenuation.  I do not think that this represents ischemia or infarction  Cardiac catheterization 2006 CONCLUSION:  1.  Mild nonobstructive coronary artery disease with irregularities in the      left anterior descending, 30% narrowing in the distal segments, 30%      proximal and 30% mid stenosis in the right coronary artery with normal      left ventricular function.  2.  Moderate elevation of pulmonary artery wedge and pulmonary artery      pressures.    RECOMMENDATIONS:  In view of these findings, I think it is not likely that  the patient's chest pain is ischemic in etiology, and I think that most of  her symptoms are likely pulmonary.  She does have a mild elevation of  pulmonary wedge pressure which could be related to LVH from hypertensive  disease, and she may have some element of diastolic dysfunction.  Will plan   pulmonary consultation.  She does have a long history of smoking, and I  suspect that this is the major cause for her symptoms.  Will plan to get a 2-  D echocardiogram to evaluate her for diastolic function as an outpatient  later.    Eustace Quail, M.D. [facility]       carotid duplex 07/01/12: [id number] RICA, [id number] LICA

## 2013-08-01 DIAGNOSIS — M5412 Radiculopathy, cervical region: Secondary | ICD-10-CM | POA: Diagnosis not present

## 2013-08-01 DIAGNOSIS — Z6841 Body Mass Index (BMI) 40.0 and over, adult: Secondary | ICD-10-CM | POA: Diagnosis not present

## 2013-08-02 ENCOUNTER — Telehealth: Payer: Self-pay | Admitting: *Deleted

## 2013-08-02 ENCOUNTER — Encounter: Payer: Self-pay | Admitting: *Deleted

## 2013-08-02 ENCOUNTER — Encounter (INDEPENDENT_AMBULATORY_CARE_PROVIDER_SITE_OTHER): Payer: Medicare Other

## 2013-08-02 DIAGNOSIS — R002 Palpitations: Secondary | ICD-10-CM

## 2013-08-02 NOTE — Telephone Encounter (Signed)
Called pt with Carotid Doppler results and she showed understanding.

## 2013-08-02 NOTE — Progress Notes (Signed)
Patient ID: Kayla [name], female   DOB: [birth year], 73 y.o.   MRN: [id number] E-Cardio 48 hour holter monitor applied to patient.

## 2013-08-03 ENCOUNTER — Ambulatory Visit: Payer: Medicare Other

## 2013-08-08 ENCOUNTER — Telehealth: Payer: Self-pay

## 2013-08-08 ENCOUNTER — Other Ambulatory Visit: Payer: Self-pay | Admitting: Neurosurgery

## 2013-08-08 DIAGNOSIS — M5000 Cervical disc disorder with myelopathy, unspecified cervical region: Secondary | ICD-10-CM

## 2013-08-08 NOTE — Telephone Encounter (Signed)
I called the pt to give her the results of her 48 hour holter monitor (few irregular beats, nothing significant per Dr Ron Parker). The pt states that she is not going to have nuclear stress test done because she states she has had problems doing this test in the past and because her Echo, Carotid duplex and holter monitor results were ok. She also requested that I cancel her f/u appt on 4/28 with Dr Ron Parker as she states she is going to go to her lung MD for her SOB. I cancelled her f/u appt. And will forward note to Dr Ron Parker as a Juluis Rainier.

## 2013-08-16 ENCOUNTER — Encounter (HOSPITAL_COMMUNITY): Payer: Medicare Other

## 2013-08-21 ENCOUNTER — Ambulatory Visit
Admission: RE | Admit: 2013-08-21 | Discharge: 2013-08-21 | Disposition: A | Discharge status: Home / Self Care | Payer: Medicare Other | Source: Ambulatory Visit | Attending: Neurosurgery | Admitting: Neurosurgery

## 2013-08-21 DIAGNOSIS — M47817 Spondylosis without myelopathy or radiculopathy, lumbosacral region: Secondary | ICD-10-CM | POA: Diagnosis not present

## 2013-08-21 DIAGNOSIS — M5000 Cervical disc disorder with myelopathy, unspecified cervical region: Secondary | ICD-10-CM

## 2013-08-22 ENCOUNTER — Ambulatory Visit
Admission: RE | Admit: 2013-08-22 | Discharge: 2013-08-22 | Disposition: A | Discharge status: Home / Self Care | Payer: Medicare Other | Source: Ambulatory Visit | Attending: Family Medicine | Admitting: Family Medicine

## 2013-08-22 DIAGNOSIS — Z1231 Encounter for screening mammogram for malignant neoplasm of breast: Secondary | ICD-10-CM | POA: Diagnosis not present

## 2013-08-29 ENCOUNTER — Ambulatory Visit: Payer: Medicare Other | Admitting: Cardiology

## 2013-08-31 ENCOUNTER — Encounter (HOSPITAL_COMMUNITY): Payer: Medicare Other

## 2013-09-05 DIAGNOSIS — R51 Headache: Secondary | ICD-10-CM | POA: Diagnosis not present

## 2013-09-05 DIAGNOSIS — M5412 Radiculopathy, cervical region: Secondary | ICD-10-CM | POA: Diagnosis not present

## 2013-10-05 ENCOUNTER — Telehealth: Payer: Self-pay | Admitting: Pulmonary Disease

## 2013-10-05 MED ORDER — FLUTICASONE PROPIONATE 50 MCG/ACT NA SUSP: NASAL | Status: DC

## 2013-10-05 MED ORDER — BUDESONIDE-FORMOTEROL FUMARATE 160-4.5 MCG/ACT IN AERO: 2.0000 | INHALATION_SPRAY | Freq: Two times a day (BID) | RESPIRATORY_TRACT | Status: DC

## 2013-10-05 MED ORDER — ALBUTEROL SULFATE HFA 108 (90 BASE) MCG/ACT IN AERS: 2.0000 | INHALATION_SPRAY | RESPIRATORY_TRACT | Status: DC | PRN

## 2013-10-05 NOTE — Telephone Encounter (Signed)
Called spoke with pt. Aware RX's have been sent in. Nothing further needed

## 2013-10-06 DIAGNOSIS — J449 Chronic obstructive pulmonary disease, unspecified: Secondary | ICD-10-CM | POA: Diagnosis not present

## 2013-10-18 DIAGNOSIS — N3941 Urge incontinence: Secondary | ICD-10-CM | POA: Diagnosis not present

## 2013-10-18 DIAGNOSIS — R42 Dizziness and giddiness: Secondary | ICD-10-CM | POA: Diagnosis not present

## 2013-10-18 DIAGNOSIS — R51 Headache: Secondary | ICD-10-CM | POA: Diagnosis not present

## 2013-10-25 ENCOUNTER — Telehealth: Payer: Self-pay | Admitting: Pulmonary Disease

## 2013-10-25 DIAGNOSIS — G4733 Obstructive sleep apnea (adult) (pediatric): Secondary | ICD-10-CM

## 2013-10-25 NOTE — Telephone Encounter (Signed)
Pt states that she feels smothered by CPAP at night recently--cannot catch breath States that her PCP ordered an ONO to check and see why she is having these increased breathing issues during the night time  SOB (smothering feeling) started x 2 months ago Pt states that ONO Room Air off CPAP was ordered 10/20/13 Pt states that she was advised to use O2 at night d/t O2 dropping to 83% during test. Pt states that she did not wear CPAP the night of the test. O2 was ordered through [facility], I advised pt to use as directed until appt with VS  Spoke with Dr Daiva Eves PCP office [phone] ONO 10/20/13 records have been requested to be sent to triage fax-- I have these in my possession, will give to VS on Monday.   Pt aware to bring CPAP with her to Appt on Monday so that we may review her data to ensure that machine is functioning properly.  Appt made for patient to see Dr Halford Chessman Monday June 29 at 9:00 Pt very upset with the fact of how hard it is to get an appt with Dr Halford Chessman and the fact that he is not in the office often enough when she needs him.  Advised that patient that she can always see one of our other physicians or Rexene Edison NP with any medical concerns when Dr Halford Chessman is not available. The patient kept stating that if Dr Halford Chessman were too busy for her or if she were not a priority patient then she would go elsewhere and seek another Pulmonologist. Explained to the patient that Dr Juanetta Gosling time here is limited in office d/t him also working in the hospital. I explained to the patient that she is just as important as any other patient that we care for. Patient states that she will come in for appt with Dr Halford Chessman on Monday with hopes that we can get something figured out with why her O2 levels are dropping so bad and also why she is feeling as though she is smothering at night with the CPAP. Pt states that she may still choose to go elsewhere. I apologized for any inconvenience that could have been  avoided. Pt expressed understanding.   Will send to Dr Halford Chessman as Juluis Rainier.

## 2013-10-26 ENCOUNTER — Ambulatory Visit: Payer: BC Managed Care – PPO | Admitting: Neurology

## 2013-10-26 NOTE — Telephone Encounter (Signed)
Her nocturnal symptoms and low oxygen levels at night could be related to needing higher CPAP setting.  Please have her DME change her to auto CPAP setting with range of 5 to 15 cm H2O.  Will discuss in more detail then at her next ROV.

## 2013-10-26 NOTE — Telephone Encounter (Signed)
Spoke with the pt and notified of recs per VS She verbalized understanding  [facility]

## 2013-10-30 ENCOUNTER — Encounter: Payer: Self-pay | Admitting: Pulmonary Disease

## 2013-10-30 ENCOUNTER — Telehealth: Payer: Self-pay | Admitting: Pulmonary Disease

## 2013-10-30 ENCOUNTER — Ambulatory Visit (INDEPENDENT_AMBULATORY_CARE_PROVIDER_SITE_OTHER): Payer: Medicare Other | Admitting: Pulmonary Disease

## 2013-10-30 VITALS — BP 120/66 | HR 64 | Ht 66.0 in | Wt 263.2 lb

## 2013-10-30 DIAGNOSIS — I6529 Occlusion and stenosis of unspecified carotid artery: Secondary | ICD-10-CM

## 2013-10-30 DIAGNOSIS — R51 Headache: Secondary | ICD-10-CM | POA: Diagnosis not present

## 2013-10-30 DIAGNOSIS — G4733 Obstructive sleep apnea (adult) (pediatric): Secondary | ICD-10-CM

## 2013-10-30 DIAGNOSIS — J449 Chronic obstructive pulmonary disease, unspecified: Secondary | ICD-10-CM | POA: Diagnosis not present

## 2013-10-30 DIAGNOSIS — J4489 Other specified chronic obstructive pulmonary disease: Secondary | ICD-10-CM

## 2013-10-30 NOTE — Patient Instructions (Signed)
Will arrange for pressure change on CPAP machine Will get overnight oxygen test with you using CPAP Follow up in 6 months

## 2013-10-30 NOTE — Telephone Encounter (Signed)
LM x 1 

## 2013-10-30 NOTE — Progress Notes (Signed)
Chief Complaint  Patient presents with  . Follow-up    Discuss CPAP, headaches x 2.5 months and O2 levels. Pt reports being more short of breath--having low O2 sats at night also some during day. Order was placed 10/26/13 for pressure change--pt states she has not heard anything from [place]    History of Present Illness: Kayla [name] is a 73 y.o. female with COPD and OSA using CPAP.  She tried [place] after last visit, but is not using this anymore.  This didn't seem to help.  She continues to use sybmicort, and this helps.  She has been getting headaches.  She thinks this is related to her CPAP.  Her headache starts at night, and lasts through the midday.  It comes across the top of her head to her forehead, and sometimes into her neck and upper back.  This seems to be happening more frequently.  She is concerned she is not getting enough oxygen at night, and this is causing her to get headaches.  She denies nausea, photophobia, or aura associated with headache.  She denies numbness, weakness, or difficulty with speech.  She has tried OTC pain medications w/o relief.  She is using her CPAP every night.  She does not feel like she has an issue with mask fit.  She has nasal pillows.  TESTS: PSG 02/2005 >> AHI 10  Spirometry 10/19/08 >> FEV1 1.55 (68%), FEV1% 70  Spirometry 08/29/12 >> FEV1 1.38 (59%), FEV1% 58 Auto CPAP 09/21/12 to 10/05/12 >> used on 15 of 15 nights with average 10 hrs 22 min.  Average AHI 1.8 with mean CPAP 5 cm H2O and 90 th percentile CPAP 6 cm H2O.  She  has a past medical history of GERD (gastroesophageal reflux disease); Diverticula of colon; CAD (coronary artery disease); Hyperlipidemia; Pulmonary hypertension, secondary; Leg edema; Goiter; Obesity; Diabetes mellitus, type 2; Hypothyroidism; Left ventricular hypertrophy; Carotid artery disease; Dizziness; COPD (chronic obstructive pulmonary disease); Headache(784.0); Arthritis; Complication of anesthesia; PONV  (postoperative nausea and vomiting); and OSA (obstructive sleep apnea).  She  has past surgical history that includes Thyroidectomy (2011); Back surgery (1990's); and Total knee arthroplasty (07/27/2011).  Current Outpatient Prescriptions on File Prior to Visit  Medication Sig Dispense Refill  . Aclidinium Bromide (TUDORZA PRESSAIR) 400 MCG/ACT AEPB Inhale 1 puff into the lungs 2 (two) times daily.  1 each  5  . albuterol (PROVENTIL HFA;VENTOLIN HFA) 108 (90 BASE) MCG/ACT inhaler Inhale 2 puffs into the lungs every 4 (four) hours as needed. For shortness of breath  3 Inhaler  1  . amLODipine (NORVASC) 5 MG tablet Once a day      . aspirin 81 MG tablet Take 81 mg by mouth daily.      . budesonide-formoterol (SYMBICORT) 160-4.5 MCG/ACT inhaler Inhale 2 puffs into the lungs 2 (two) times daily.  3 Inhaler  1  . divalproex (DEPAKOTE ER) 500 MG 24 hr tablet Take 1,000 mg by mouth at bedtime.       . fluticasone (FLONASE) 50 MCG/ACT nasal spray USE 2 SPRAYS NASALLY DAILY  48 g  1  . gabapentin (NEURONTIN) 600 MG tablet Take 600 mg by mouth 3 (three) times daily.       . insulin glargine (LANTUS) 100 UNIT/ML injection Inject 100 Units into the skin 2 (two) times daily.       Marland Kitchen levothyroxine (SYNTHROID, LEVOTHROID) 200 MCG tablet Take 200 mcg by mouth daily before breakfast.      . metFORMIN (GLUCOPHAGE-XR)  500 MG 24 hr tablet Take 2 tablets by mouth 2 (two) times daily.      . metoprolol (LOPRESSOR) 50 MG tablet Take 25 mg by mouth daily.       . nitroGLYCERIN (NITROSTAT) 0.4 MG SL tablet Place 1 tablet (0.4 mg total) under the tongue every 5 (five) minutes as needed for chest pain.  25 tablet  6  . permethrin (ELIMITE) 5 % cream       . simvastatin (ZOCOR) 20 MG tablet Take 1 tablet by mouth daily.      Marland Kitchen spironolactone (ALDACTONE) 50 MG tablet As needed      . TRICOR 145 MG tablet Take 1 tablet by mouth daily.      . valsartan-hydrochlorothiazide (DIOVAN-HCT) 320-25 MG per tablet Take 1 tablet by  mouth daily.       No current facility-administered medications on file prior to visit.   No Known Allergies  Physical Exam:  General - Obese HEENT - wears glasses, pupils equal/reactive, no sinus tenderness, no oral exudate, no LAN Cardiac - s1s2 regular, no murmur Chest - no wheeze/rales Abdomen - soft, nontender Extremities - no edema Skin - no rashes Neurologic - normal strength, CN intact Psychiatric - normal mood, behavior  Assessment/Plan:  Chesley Mires, MD [facility] 10/30/2013, 9:27 AM Pager:  [phone] After 3pm call: [phone]

## 2013-10-31 DIAGNOSIS — G4733 Obstructive sleep apnea (adult) (pediatric): Secondary | ICD-10-CM | POA: Diagnosis not present

## 2013-10-31 NOTE — Telephone Encounter (Signed)
Per pt's chart, order was sent on 6.24.15 to increase pt's CPAP pressure to 5-15cm.  Called spoke with patient who reported that she took her machine to [place] yesterday 6.29.15 and was informed that her machine is already on this setting.  She is requesting recommendations.  Dr Halford Chessman please advise, thank you.  **she also had her ONO done last night - would like VS to be aware.

## 2013-11-01 NOTE — Telephone Encounter (Signed)
LMOm x 1

## 2013-11-01 NOTE — Telephone Encounter (Signed)
Pt scheduled for CPAP Titration Study 12/21/13.  Called & spoke with patient to give her appt info.  Pt wanted to know what her ins would cover and what would be her out of pocket expenses.  I  gave pt the CPT code for CPAP Titration study and advised she would need to call her ins companies to inquire about benefits.  I gave patient the phone # for the sleep lab and told her once we have authorization from her ins she will be placed on the Sleep Lab cancellation list for a sooner appt.  I am mailing Sleep Packet to patient and she is aware of this as well.  Pt voiced understanding of all Kayla [name]

## 2013-11-01 NOTE — Telephone Encounter (Signed)
Spoke with pt over phone.  She says she does not have enough money to pay for additional tests in sleep lab or new equipment if she needed to change to BiPAP.  Will ask [facility] to cancel order for sleep study.  Will instead place order for starting the pt on 2 liters oxygen at night with CPAP.  Will then repeat ONO with 2 liters and CPAP, and get CPAP download.

## 2013-11-01 NOTE — Telephone Encounter (Signed)
Spoke with Kayla [name] and advised that we are still waiting on Dr Juanetta Gosling recommendations regarding CPAP setting.  Will also await results of ONO from [place].  Kayla [name] verbalized understanding.

## 2013-11-01 NOTE — Telephone Encounter (Signed)
Pt has called back and states Rx doctor Sood sent over to [facility] has not been signed. She states [employer] told her it has been sent back twice.

## 2013-11-01 NOTE — Telephone Encounter (Signed)
Spoke with Dr Halford Chessman, has agreed to contact patient Will send to VS

## 2013-11-01 NOTE — Telephone Encounter (Signed)
Pt is returning call.  Pt states our office called her but didn't leave a message.  Pt is frustrated that she is having such issues speaking with someone at our office & asks if we will contact her asap.  Satira Anis

## 2013-11-01 NOTE — Telephone Encounter (Signed)
Pt has called back to ask about her oxygen level and to see what she should do. Please call back on her cell phone [phone].

## 2013-11-01 NOTE — Telephone Encounter (Signed)
Spoke with [facility] Per Medicare Guidelines, pt is not eligible for O2 without Titration Study in sleep lab (which is what was originally ordered by Dr Halford Chessman) Seeing that the patient is using CPAP at night and has OSA dx patient does not qualify without having this lab administered test.   Pt aware of the above Order placed for titration study as ordinally ordered. (Please start with CPAP on room air. Transition to BiPAP and add supplemental oxygen as needed.) Pt would like to be informed of insurance payment and out of pocket pay before this test is scheduled.   Will send to VS as FYI

## 2013-11-01 NOTE — Telephone Encounter (Signed)
ONO with RA and CPAP 10/30/13 >> test time 8 hrs 48 min.  Basal SpO2 89.2%, low SpO2 82%.  Spent 161.5 min with SpO2 < 88%.  Also received auto CPAP report from pt's DME >> CPAP download is dated from 09/21/12 to 10/05/12.  Left message explaining that she will need to have in lab titration study to determine optimal set up (ie CPAP vs BiPAP +/- supplemental oxygen).  I have sent order to arrange for titration study.  Will have my nurse f/u with pt to confirm receipt of message and acceptance of plan.

## 2013-11-08 DIAGNOSIS — E1149 Type 2 diabetes mellitus with other diabetic neurological complication: Secondary | ICD-10-CM | POA: Diagnosis not present

## 2013-11-08 DIAGNOSIS — Z79899 Other long term (current) drug therapy: Secondary | ICD-10-CM | POA: Diagnosis not present

## 2013-11-08 DIAGNOSIS — E559 Vitamin D deficiency, unspecified: Secondary | ICD-10-CM | POA: Diagnosis not present

## 2013-11-08 DIAGNOSIS — N183 Chronic kidney disease, stage 3 unspecified: Secondary | ICD-10-CM | POA: Diagnosis not present

## 2013-11-08 DIAGNOSIS — E782 Mixed hyperlipidemia: Secondary | ICD-10-CM | POA: Diagnosis not present

## 2013-11-08 DIAGNOSIS — I10 Essential (primary) hypertension: Secondary | ICD-10-CM | POA: Diagnosis not present

## 2013-11-09 NOTE — Assessment & Plan Note (Signed)
She is to continue symbicort and prn albuterol. 

## 2013-11-09 NOTE — Assessment & Plan Note (Signed)
If there are no issues with her CPAP set up and she does not need supplemental oxygen, then I have advised her to d/w her PCP about further assessment of her headaches.

## 2013-11-09 NOTE — Assessment & Plan Note (Signed)
I am not sure whether her sleep apnea is contributing to her c/o headaches.  To further assess will have her use auto CPAP range 5 to 15 cm H2O, and get download from her machine.  Will also arrange for ONO with CPAP and room air to determine whether she needs to use supplemental oxygen at night.

## 2013-11-10 ENCOUNTER — Ambulatory Visit: Payer: Medicare Other | Admitting: Pulmonary Disease

## 2013-11-11 ENCOUNTER — Encounter: Payer: Self-pay | Admitting: Pulmonary Disease

## 2013-11-14 ENCOUNTER — Ambulatory Visit: Payer: BC Managed Care – PPO | Admitting: Neurology

## 2013-11-27 DIAGNOSIS — H2510 Age-related nuclear cataract, unspecified eye: Secondary | ICD-10-CM | POA: Diagnosis not present

## 2013-11-27 DIAGNOSIS — E119 Type 2 diabetes mellitus without complications: Secondary | ICD-10-CM | POA: Diagnosis not present

## 2013-12-21 ENCOUNTER — Ambulatory Visit (HOSPITAL_BASED_OUTPATIENT_CLINIC_OR_DEPARTMENT_OTHER): Payer: Medicare Other | Attending: Pulmonary Disease | Admitting: Radiology

## 2013-12-21 VITALS — Ht 66.0 in | Wt 260.0 lb

## 2013-12-21 DIAGNOSIS — G4733 Obstructive sleep apnea (adult) (pediatric): Secondary | ICD-10-CM

## 2013-12-21 DIAGNOSIS — J449 Chronic obstructive pulmonary disease, unspecified: Secondary | ICD-10-CM | POA: Insufficient documentation

## 2013-12-21 DIAGNOSIS — J4489 Other specified chronic obstructive pulmonary disease: Secondary | ICD-10-CM | POA: Insufficient documentation

## 2013-12-21 DIAGNOSIS — Z9989 Dependence on other enabling machines and devices: Secondary | ICD-10-CM

## 2013-12-26 ENCOUNTER — Telehealth: Payer: Self-pay | Admitting: Pulmonary Disease

## 2013-12-26 DIAGNOSIS — G4733 Obstructive sleep apnea (adult) (pediatric): Secondary | ICD-10-CM

## 2013-12-26 NOTE — Telephone Encounter (Signed)
CPAP 12/21/13 >> CPAP 9 cm H2O >> AHI 0, +R.  Needs 2 liters oxygen.  PLMI 70.1  Will have my nurse inform pt that CPAP study shows control of sleep apnea with CPAP 9 cm H2O.  She still has low oxygen levels at night, and will need to use 2 liters oxygen at night with CPAP.  Please arrange for home oxygen set up of 2 liters at night with CPAP.  She had previous overnight oximetry on October 30, 2013 >> please verify whether she needs repeat overnight oximetry for insurance purposes prior to arranging for oxygen therapy with CPAP.  If she needs repeat overnight oximetry, then arrange for ONO with room air on CPAP.

## 2013-12-26 NOTE — Sleep Study (Signed)
[place]  NAME: Kayla [name] DATE OF BIRTH:  09/17/1940 MEDICAL RECORD NUMBER [id number]  [facility]  PHYSICIAN: Chesley Mires, M.D. DATE OF STUDY: 12/21/2013  SLEEP STUDY TYPE: CPAP Titration               REFERRING PHYSICIAN: Chesley Mires, MD  INDICATION FOR STUDY:  Kayla [name] is a 73 y.o. female with hx of OSA and COPD.  She has been on CPAP therapy.  She has more recent trouble of sleep disruption, daytime sleepiness, and headaches.  She presents to the sleep lab for CPAP titration study.  EPWORTH SLEEPINESS SCORE: 4. HEIGHT: 5\' 6"  (167.6 cm)  WEIGHT: 260 lb (117.935 kg)    Body mass index is 41.99 kg/(m^2).  NECK SIZE: 16 in.  MEDICATIONS:  Current Outpatient Prescriptions on File Prior to Visit  Medication Sig Dispense Refill  . albuterol (PROVENTIL HFA;VENTOLIN HFA) 108 (90 BASE) MCG/ACT inhaler Inhale 2 puffs into the lungs every 4 (four) hours as needed. For shortness of breath  3 Inhaler  1  . amLODipine (NORVASC) 5 MG tablet Once a day      . aspirin 81 MG tablet Take 81 mg by mouth daily.      . budesonide-formoterol (SYMBICORT) 160-4.5 MCG/ACT inhaler Inhale 2 puffs into the lungs 2 (two) times daily.  3 Inhaler  1  . divalproex (DEPAKOTE ER) 500 MG 24 hr tablet Take 1,000 mg by mouth at bedtime.       . fluticasone (FLONASE) 50 MCG/ACT nasal spray USE 2 SPRAYS NASALLY DAILY  48 g  1  . gabapentin (NEURONTIN) 600 MG tablet Take 600 mg by mouth 3 (three) times daily.       . insulin glargine (LANTUS) 100 UNIT/ML injection Inject 100 Units into the skin 2 (two) times daily.       Marland Kitchen levothyroxine (SYNTHROID, LEVOTHROID) 200 MCG tablet Take 200 mcg by mouth daily before breakfast.      . metFORMIN (GLUCOPHAGE-XR) 500 MG 24 hr tablet Take 2 tablets by mouth 2 (two) times daily.      . metoprolol (LOPRESSOR) 50 MG tablet Take 25 mg by mouth daily.       . nitroGLYCERIN (NITROSTAT) 0.4 MG SL tablet Place 1 tablet  (0.4 mg total) under the tongue every 5 (five) minutes as needed for chest pain.  25 tablet  6  . permethrin (ELIMITE) 5 % cream       . simvastatin (ZOCOR) 20 MG tablet Take 1 tablet by mouth daily.      Marland Kitchen spironolactone (ALDACTONE) 50 MG tablet As needed      . TRICOR 145 MG tablet Take 1 tablet by mouth daily.      . valsartan-hydrochlorothiazide (DIOVAN-HCT) 320-25 MG per tablet Take 1 tablet by mouth daily.       No current facility-administered medications on file prior to visit.    SLEEP ARCHITECTURE:  Total recording time: 427 minutes.  Total sleep time was: 344 minutes.  Sleep efficiency: 80.6%.  Sleep latency: 30 minutes.  REM latency: 56.5 minutes.  Stage N1: 5.5%.  Stage N2: 74.7%.  Stage N3: 0%.  Stage R:  19.8%.  Supine sleep: 14 minutes.  Non-supine sleep: 330 minutes.  CARDIAC DATA:  Average heart rate: 68 beats per minute. Rhythm strip: sinus rhythm with PVCs and PACs.  RESPIRATORY DATA: Average respiratory rate: 20.  She was started on CPAP 5 and increased to CPAP 9  cm H2O.  With CPAP at 9 cm H2O her AHI was 0, and she was observed in REM sleep.  MOVEMENT/PARASOMNIA:  Periodic limb movement: 70.1.  Period limb movements with arousals: 0.7. Restroom trips: two.  OXYGEN DATA:  Baseline oxygenation: 93%. Lowest SaO2: 81%. Time spent below SaO2 90%: 46.9 minutes. Supplemental oxygen used: 1 liter.  She had persistent oxygen desaturation, especially during REM sleep, in the absence of other respiratory events.  IMPRESSION/ RECOMMENDATION:   She had good control of her obstructive sleep apnea with CPAP 9 cm H2O.  She was fitted with a Fisher Paykel standard size [place] Q nasal pillows mask.  She had oxygen desaturation in the absence of other respiratory events consistent with sleep related hypoventilation/hypoxia.  Recommend she use 2 liters oxygen with CPAP.  She had an increase in her periodic limb movement index.  Correlate clinically.    Chesley Mires,  M.D. Diplomate, Tax adviser of Sleep Medicine  ELECTRONICALLY SIGNED ON:  12/26/2013, 12:43 PM [place] PH: [phone]   FX: [phone] [place]

## 2013-12-27 NOTE — Telephone Encounter (Signed)
Pt is requesting results & asks to be reached ASAP at [phone] or [phone].  Kayla [name]

## 2013-12-27 NOTE — Telephone Encounter (Signed)
Pt advised of results. Orders placed. [place] Bing, CMA

## 2013-12-28 ENCOUNTER — Ambulatory Visit: Payer: BC Managed Care – PPO | Admitting: Neurology

## 2013-12-29 ENCOUNTER — Telehealth: Payer: Self-pay | Admitting: Pulmonary Disease

## 2013-12-29 NOTE — Telephone Encounter (Signed)
Called and spoke with [employer] and they have to drop off some forms on Monday for VS to sign.  i called the pt to make her aware and she was very upset that this has taken this long to set up everything.  She stated that she is upset that she has to keep having headaches until she can get her o2 with the cpap done.  i advised her that we have to follow the medicare guidelines and all the forms have to be completed prior to [facility] sending out her equipment.  Pt is aware and nothing further.  Will forward to [name] to follow up on paper work on Monday.

## 2014-01-01 NOTE — Telephone Encounter (Signed)
Kayla [name] with Kayla [name] stopped in.  Tried to order O2 for pt that sleep study completed on.  The sleep study isn't what the insurance is requesting.  Doesn't show desat below 88% or below.  Asking to speak w/ VS's nurse.  Can be reached at   [phone].  Waiting for [name] in the lobby as discussed w/ [name].  Satira Anis

## 2014-01-01 NOTE — Telephone Encounter (Signed)
Spoke with Jeneen Rinks from [place]-- states that the patient did not qualify for O2 Pt did not desat below 89% for a long enough period of time to qualify her--there were low O2 sats documented between 81-89% but there was not time frame attached as to how long the patient stayed at these levels. However, the results did show that she was at 89% x 4 mins at her optimal CPAP pressure of 9. This does not qualify her for O2. Needs 30min or more to qualify.   Per Jeneen Rinks, if the patient qualifies for O2 with exertion then she may be able to use this at night (using concentrator). Per Jeneen Rinks, this could be an option to get her O2 to use nightly PRN?? ---- Pt aware of these results. Aware that we will speak with Dr Halford Chessman to review these results and give rec's.  Pt states that she is having increased headaches. Waking up every morning with headaches recently.  Wanting something done and wanting O2 at night. Pt states that if she cannot get what she needs result wise from our office, then she will find her answers elsewhere with another physician.  Pt requesting an answer by 01/02/14.   Please advise Dr Halford Chessman. Thanks.

## 2014-01-02 NOTE — Telephone Encounter (Signed)
Waiting to hear from [place]

## 2014-01-02 NOTE — Telephone Encounter (Signed)
Pt is asking to speak w/ a nurse asap.  Pt can be reached at [phone] or [phone].  Kayla [name]

## 2014-01-02 NOTE — Telephone Encounter (Signed)
Please inform Jeneen Rinks with [facility] that the patient had to be placed on supplemental oxygen during the titration study from 12/26/13 because she had persistent oxygen desaturations after her sleep apnea was controlled with CPAP.  She would therefore not have oxygen desaturations at optimal CPAP setting because she was already placed on supplemental oxygen at that point!!!  Please inform Jeneen Rinks that he needs to proceed with supplying supplemental oxygen for Ms. Shaikh.  If he continues to have difficulty interpreting CPAP titration report, then he needs to come to office and speak to me directly.

## 2014-01-02 NOTE — Telephone Encounter (Signed)
I spoke with the Kayla [name] and advised where we are in the process and apologized for the delays. Kayla [name] is upset but states understanding. [facility] see below.  [name], CMA

## 2014-01-02 NOTE — Telephone Encounter (Signed)
I spoke with Jeneen Rinks and advised of the below. He states he understands but medicare is requiring different information. He states he is going to review this again with Arbie Cookey and call us back. I advised him to speak with [facility]. I am going to forward this message to them to follow-up on and see if there is anything they are aware of or can help with. Thanks! [place] Bing, CMA

## 2014-01-03 ENCOUNTER — Encounter: Payer: Self-pay | Admitting: Pulmonary Disease

## 2014-01-09 ENCOUNTER — Telehealth: Payer: Self-pay | Admitting: Pulmonary Disease

## 2014-01-09 DIAGNOSIS — G4733 Obstructive sleep apnea (adult) (pediatric): Secondary | ICD-10-CM

## 2014-01-09 NOTE — Telephone Encounter (Signed)
Pt is calling back again wants to know something [phone]  Or cell [phone]

## 2014-01-09 NOTE — Telephone Encounter (Signed)
Kathleen Lime Ottinger at 01/02/2014 3:20 PM    Status: Signed       Waiting to hear from [place] Castillo, CMA at 01/02/2014 1:17 PM     Status: Signed        I spoke with the pt and advised where we are in the process and apologized for the delays. Pt is upset but states understanding. [facility] see below. [place] Bing, [place] Castillo, CMA at 01/02/2014 1:12 PM     Status: Signed        I spoke with Jeneen Rinks and advised of the below. He states he understands but medicare is requiring different information. He states he is going to review this again with Arbie Cookey and call us back. I advised him to speak with [facility]. I am going to forward this message to them to follow-up on and see if there is anything they are aware of or can help with. Thanks! [place] Bing,   ---  Called spoke with pt. She is very upset she has not heard from anyone. She reports she needs her O2. Please advise pcc's thanks

## 2014-01-11 ENCOUNTER — Ambulatory Visit (INDEPENDENT_AMBULATORY_CARE_PROVIDER_SITE_OTHER): Payer: Medicare Other | Admitting: Neurology

## 2014-01-11 ENCOUNTER — Encounter: Payer: Self-pay | Admitting: Neurology

## 2014-01-11 VITALS — BP 166/82 | HR 66 | Ht 66.0 in | Wt 257.0 lb

## 2014-01-11 DIAGNOSIS — R51 Headache: Secondary | ICD-10-CM | POA: Diagnosis not present

## 2014-01-11 DIAGNOSIS — I6529 Occlusion and stenosis of unspecified carotid artery: Secondary | ICD-10-CM

## 2014-01-11 MED ORDER — TOPIRAMATE 50 MG PO TABS: ORAL_TABLET | ORAL | Status: DC

## 2014-01-11 MED ORDER — TRAMADOL HCL 50 MG PO TABS: 50.0000 mg | ORAL_TABLET | Freq: Two times a day (BID) | ORAL | Status: DC | PRN

## 2014-01-11 NOTE — Telephone Encounter (Signed)
cpap titration does not show 02 sats 88 or below for 17mins or more, while pt is on optimal pressure of cpap this does not qualify her for 02

## 2014-01-11 NOTE — Progress Notes (Signed)
PATIENT: Kayla [name] DOB: 01-Aug-1940  HISTORICAL  Kayla [name] is a 62 RH Caucasian female, was referred by her primary care Dr. Lisbeth Ply for evaluation of headaches  She had a past medical history of obesity, obstructive sleep apnea, Using CPAP machine, COPD, type 2 diabetes, thyroid disease, status post thyroidectomy, on thyroid supplements.  She presented with frequent headaches for many years, she was actually seen by our clinic in 2011 for headaches, per record, MRI of the brain was normal in 2010, she complains of daily headaches at that time, she was started on Depakote ER 500 mg 2 tablets every night for few years, initially it did help her headaches  Now her headache has been worsened since January 2015, she complains of constant holocranial pressure headaches, sometimes exacerbated to pounding headaches, she denies significant light sensitivity, does has nausea, noise sensitivity when the headache is severe, extra strength Tylenol only help it temporarily,  She also had repeat sleep study per patient, was told she has transient hypoxia using CPAP machine during her sleep, was suggested oxygen use at night time, but was denied by her insurance company  She also complains of excessive fatigue, bilateral feet paresthesia due to 2 diabetes. Orthostatic dizziness Taking gabapentin 600 mg 3 times a day, she denied chewing difficulty,,  REVIEW OF SYSTEMS: Full 14 system review of systems performed and notable only for swelling in the legs, blurred vision, eye pain, shortness of breath, wheezing, eye bruising, memory loss, confusion, headaches, dizziness, insomnia, restless leg,   ALLERGIES: No Known Allergies  HOME MEDICATIONS: Current Outpatient Prescriptions on File Prior to Visit  Medication Sig Dispense Refill  . albuterol (PROVENTIL HFA;VENTOLIN HFA) 108 (90 BASE) MCG/ACT inhaler Inhale 2 puffs into the lungs every 4 (four) hours as needed. For shortness of breath  3  Inhaler  1  . amLODipine (NORVASC) 5 MG tablet Once a day      . aspirin 81 MG tablet Take 81 mg by mouth daily.      . budesonide-formoterol (SYMBICORT) 160-4.5 MCG/ACT inhaler Inhale 2 puffs into the lungs 2 (two) times daily.  3 Inhaler  1  . divalproex (DEPAKOTE ER) 500 MG 24 hr tablet Take 1,000 mg by mouth at bedtime.       . fluticasone (FLONASE) 50 MCG/ACT nasal spray USE 2 SPRAYS NASALLY DAILY  48 g  1  . gabapentin (NEURONTIN) 600 MG tablet Take 600 mg by mouth 3 (three) times daily.       . insulin glargine (LANTUS) 100 UNIT/ML injection Inject 100 Units into the skin 2 (two) times daily.       Marland Kitchen levothyroxine (SYNTHROID, LEVOTHROID) 200 MCG tablet Take 200 mcg by mouth daily before breakfast.      . metFORMIN (GLUCOPHAGE-XR) 500 MG 24 hr tablet Take 2 tablets by mouth 2 (two) times daily.      . metoprolol (LOPRESSOR) 50 MG tablet Take 25 mg by mouth daily.       . permethrin (ELIMITE) 5 % cream       . simvastatin (ZOCOR) 20 MG tablet Take 1 tablet by mouth daily.      Marland Kitchen spironolactone (ALDACTONE) 50 MG tablet As needed      . TRICOR 145 MG tablet Take 1 tablet by mouth daily.      . valsartan-hydrochlorothiazide (DIOVAN-HCT) 320-25 MG per tablet Take 1 tablet by mouth daily.      . nitroGLYCERIN (NITROSTAT) 0.4 MG SL tablet Place 1 tablet (  0.4 mg total) under the tongue every 5 (five) minutes as needed for chest pain.  25 tablet  6   No current facility-administered medications on file prior to visit.    PAST MEDICAL HISTORY: Past Medical History  Diagnosis Date  . GERD (gastroesophageal reflux disease)   . Diverticula of colon   . CAD (coronary artery disease)     Mild per remote cath in 2006, /    Nuclear, January, 2013, no significant abnormality,  . Hyperlipidemia   . Pulmonary hypertension, secondary   . Leg edema     occasional swelling  . Goiter     hx of  . Obesity   . Diabetes mellitus, type 2   . Hypothyroidism   . Left ventricular hypertrophy   . Carotid  artery disease     Doppler, January, 2013, 0 65% RIC A., 46-50% LICA, stable  . Dizziness     occasional  . COPD (chronic obstructive pulmonary disease)   . Headache(784.0)     migraine  . Arthritis   . Complication of anesthesia     wakes up "mean"  . PONV (postoperative nausea and vomiting)   . OSA (obstructive sleep apnea)     cpap setting of 12    PAST SURGICAL HISTORY: Past Surgical History  Procedure Laterality Date  . Thyroidectomy  2011  . Back surgery  1990's    lower back  . Total knee arthroplasty  07/27/2011    Procedure: TOTAL KNEE ARTHROPLASTY;  Surgeon: Mauri Pole, MD;  Location: [facility];  Service: Orthopedics;  Laterality: Left;  . Abdominal hysterectomy      FAMILY HISTORY: Family History  Problem Relation Age of Onset  . Heart Problems Mother   . Heart Problems Father     SOCIAL HISTORY:  History   Social History  . Marital Status: Married    Spouse Name: Jeneen Rinks    Number of Children: 3  . Years of Education: 12   Occupational History  . retired    Social History Main Topics  . Smoking status: Former Smoker -- 1.50 packs/day for 34 years    Types: Cigarettes    Quit date: 05/04/1997  . Smokeless tobacco: Never Used  . Alcohol Use: No  . Drug Use: No  . Sexual Activity: Not on file   Other Topics Concern  . Not on file   Social History Narrative   Patient lives at home with her husband Jeneen Rinks .   Retired.   Education 12 th grade.   Right handed   Caffeine two cups of coffee.   PHYSICAL EXAM   Filed Vitals:   01/11/14 0821  BP: 166/82  Pulse: 66  Height: _0  (1.676 m)  Weight: 257 lb (116.574 kg)    Not recorded    Body mass index is 41.5 kg/(m^2).   Generalized: In no acute distress  Neck: Supple, no carotid bruits   Cardiac: Regular rate rhythm  Pulmonary: Clear to auscultation bilaterally  Musculoskeletal: No deformity  Neurological examination  Mentation: Alert oriented to time, place, history taking,  and causual conversation, obese,   Cranial nerve II-XII: Pupils were equal round reactive to light. Extraocular movements were full.  Visual field were full on confrontational test. Bilateral fundi were sharp.  Facial sensation and strength were normal. Hearing was intact to finger rubbing bilaterally. Uvula tongue midline.  Head turning and shoulder shrug and were normal and symmetric.Tongue protrusion into cheek strength was normal.  Motor: Normal  tone, bulk and strength.  Sensory: length dependent decreased fine touch, pinprick to mid shin level, absent  vibratory sensation to mid shin, and preserved proprioception at toes.  Coordination: Normal finger to nose, heel-to-shin bilaterally there was no truncal ataxia  Gait: Rising up from seated position without assistance, normal stance, without trunk ataxia, moderate stride, good arm swing Romberg signs: Negative  Deep tendon reflexes: Brachioradialis 2/2, biceps 2/2, triceps 2/2, patellar 2/2, Achilles trace plantar responses were flexor bilaterally.   DIAGNOSTIC DATA (LABS, IMAGING, TESTING) - I reviewed patient records, labs, notes, testing and imaging myself where available.  Lab Results  Component Value Date   WBC 7.5 05/30/2013   HGB 11.2* 05/30/2013   HCT 34.7* 05/30/2013   MCV 91.6 05/30/2013   PLT 330 05/30/2013      Component Value Date/Time   NA 140 05/30/2013 1425   K 5.4* 05/30/2013 1425   CL 100 05/30/2013 1425   CO2 23 05/30/2013 1425   GLUCOSE 194* 05/30/2013 1425   BUN 44* 05/30/2013 1425   CREATININE 1.53* 05/30/2013 1425   CALCIUM 8.8 05/30/2013 1425   CALCIUM 9.9 06/04/2008 2240   GFRNONAA 33* 05/30/2013 1425   GFRAA 38* 05/30/2013 1425    Lab Results  Component Value Date   TSH 48.02* 06/04/2008    ASSESSMENT AND PLAN  TRENA DUNAVAN is a 73 y.o. female complains of  frequent headaches, diabetic peripheral neuropathy, obesity on examinations, her headache is most consistent with tension headaches, could be  related hypoxia during nighttime sleep, also need to rule out structural lesion, inflammatory process    1. MRI of the brain,   2. Laboratory evaluation from primary care physician, will consider ESR, C-reactive protein   3. Tapering off Depakote, start Topamax, titrating to 100 mg twice a day. tramadol as needed   Marcial Pacas, M.D. Ph.D.  [facility] [address], [place], [place] [address] [phone]

## 2014-01-11 NOTE — Telephone Encounter (Signed)
I am not sure how I need to say this so her DME will understand.  She had oxygen desaturation while on CPAP and sleep apnea was controlled.  She was then placed on supplemental oxygen with CPAP.  That is why she did not have anymore oxygen desaturation >> because we did not leave her with a low oxygen saturation!!!  If this can not be resolved then I need to have a meeting set up with manager for DME to discuss why they have so much difficulty following my orders.

## 2014-01-11 NOTE — Patient Instructions (Signed)
Depakote ER 500mg  one po every day for one week, then stop

## 2014-01-11 NOTE — Telephone Encounter (Signed)
PCC's please advise. Thanks 

## 2014-01-15 NOTE — Telephone Encounter (Signed)
Next message is pt was not on cpap and off 02 for 34mins with desats 88 or below that is what qualifies for noc 02 the put her on the 02 before 33min were up Kayla [name]

## 2014-01-17 NOTE — Telephone Encounter (Signed)
Can you please show me what her DME is looking at so that I can understand why there interpretation of the data is different than mine.

## 2014-01-17 NOTE — Telephone Encounter (Signed)
Spoke to [email or web address]  she will fax over the documentation Joellen Jersey

## 2014-01-18 ENCOUNTER — Other Ambulatory Visit: Payer: Self-pay | Admitting: Pulmonary Disease

## 2014-01-18 MED ORDER — FLUTICASONE PROPIONATE 50 MCG/ACT NA SUSP: NASAL | Status: DC

## 2014-01-18 NOTE — Addendum Note (Signed)
Addended by: Virl Cagey on: 01/18/2014 05:36 PM   Modules accepted: Orders

## 2014-01-22 ENCOUNTER — Ambulatory Visit
Admission: RE | Admit: 2014-01-22 | Discharge: 2014-01-22 | Disposition: A | Discharge status: Home / Self Care | Payer: Medicare Other | Source: Ambulatory Visit | Attending: Neurology | Admitting: Neurology

## 2014-01-22 DIAGNOSIS — R51 Headache: Secondary | ICD-10-CM

## 2014-01-23 NOTE — Telephone Encounter (Signed)
Has this been taken care of ?  ?Thanks  ? ? ?

## 2014-01-23 NOTE — Telephone Encounter (Signed)
So order can only be tagged with OSA diagnosis?  We can't use COPD as diagnosis?  Please find out what exactly needs to be done at this point to get pt what she needs and make insurance and DME happy.

## 2014-01-23 NOTE — Telephone Encounter (Signed)
No o2 was not qualified for nighttime Joellen Jersey

## 2014-01-23 NOTE — Telephone Encounter (Signed)
[facility] was to call DME to determine if home oxygen can be set up with dx of COPD and chronic respiratory failure.  Will route to [facility].

## 2014-01-24 ENCOUNTER — Telehealth: Payer: Self-pay | Admitting: Neurology

## 2014-01-24 NOTE — Telephone Encounter (Signed)
Need order for cpap study make sure pt qualifies for 02 35mins or more before placing them on [place]

## 2014-01-24 NOTE — Telephone Encounter (Signed)
Called spoke with pt. She is aware we are needing to repeat the study d/t O2 being added to soon during study. She is fine with doing this at no charge. Order placed. Libby aware.

## 2014-01-24 NOTE — Telephone Encounter (Signed)
Please advise [facility] and I can place order. thanks

## 2014-01-24 NOTE — Telephone Encounter (Signed)
Can we get this set up for her again.  Please see if this can be done at no charge to her >> sleep lab didn't give her long enough time on optimal CPAP setting from titration study 12/21/13 >> if she was left of oxygen on CPAP 9 for 5 minutes, then she would have qualified.

## 2014-01-24 NOTE — Telephone Encounter (Signed)
Calling for MRI results.

## 2014-01-24 NOTE — Telephone Encounter (Signed)
Very good. Thank you!

## 2014-01-24 NOTE — Telephone Encounter (Signed)
She needs a cpap titration study don on optimal cpap pressure with sats 88 or below for 41mins or more before adding 02 the sleep center put 02 on her too soon Kayla [name]

## 2014-01-24 NOTE — Telephone Encounter (Signed)
Butch Penny, Please call patient, MRI brain showed age related changes, no new lesions.  MRI brain (without) demonstrating:  1. Moderate perisylvian atrophy.  2. Mild periventricular and subcortical foci of non-specific gliosis.  3. No acute findings.  4. Compared to MRI on1/28/10 there is progression of atrophy; otherwise no change.

## 2014-01-25 NOTE — Telephone Encounter (Signed)
Spoke to patient and relayed MRI brain results showing age related changes, per Dr. Krista Blue.  Patient relayed that she is having side effects from the Topamax, dizziness and unbalance gait.  She is now on the higher dose of Topamax and does want to keep trying to help with the headaches but also wants doctors advice on how long before side effects may take to disappear.

## 2014-01-25 NOTE — Telephone Encounter (Signed)
Patient calling again for MRI results, also has some problems taking her medication, please return call and advise.

## 2014-01-25 NOTE — Telephone Encounter (Signed)
I have called Kayla [name], she is tolerating Topamax 50 mg 2 tablets twice a day, tramadol 50 mg as needed is helping her headaches , but she complains the side effect all of mild dizziness afterwards,  I have advised her only taking tramadol during headache, no more than 2 tablets in 24 hours.  Keep followup appointment

## 2014-01-26 NOTE — Telephone Encounter (Signed)
Called patient and left message to keep her follow appt with Dr.Yan

## 2014-02-07 ENCOUNTER — Encounter: Payer: Self-pay | Admitting: Neurology

## 2014-02-07 ENCOUNTER — Ambulatory Visit (INDEPENDENT_AMBULATORY_CARE_PROVIDER_SITE_OTHER): Payer: Medicare Other | Admitting: Neurology

## 2014-02-07 VITALS — BP 142/59 | HR 60 | Ht 66.0 in | Wt 245.0 lb

## 2014-02-07 DIAGNOSIS — E785 Hyperlipidemia, unspecified: Secondary | ICD-10-CM

## 2014-02-07 DIAGNOSIS — I6529 Occlusion and stenosis of unspecified carotid artery: Secondary | ICD-10-CM

## 2014-02-07 DIAGNOSIS — E890 Postprocedural hypothyroidism: Secondary | ICD-10-CM

## 2014-02-07 DIAGNOSIS — E0821 Diabetes mellitus due to underlying condition with diabetic nephropathy: Secondary | ICD-10-CM

## 2014-02-07 DIAGNOSIS — I10 Essential (primary) hypertension: Secondary | ICD-10-CM | POA: Diagnosis not present

## 2014-02-07 DIAGNOSIS — N08 Glomerular disorders in diseases classified elsewhere: Secondary | ICD-10-CM | POA: Diagnosis not present

## 2014-02-07 MED ORDER — NORTRIPTYLINE HCL 10 MG PO CAPS: ORAL_CAPSULE | ORAL | Status: DC

## 2014-02-07 NOTE — Progress Notes (Signed)
PATIENT: Kayla [name] DOB: June 13, 1940  HISTORICAL  CLARISSA LAIRD is a 73 RH Caucasian female, was referred by her primary care Dr. Lisbeth Ply for evaluation of headaches  She had a past medical history of obesity, obstructive sleep apnea, Using CPAP machine, COPD, type 2 diabetes, thyroid disease, status post thyroidectomy, on thyroid supplements.  She presented with frequent headaches for many years, she was actually seen by our clinic in 2011 for headaches, per record, MRI of the brain was normal in 2010, she complains of daily headaches at that time, she was started on Depakote ER 500 mg 2 tablets every night for few years, initially it did help her headaches  Now her headache has been worsened since January 2015, she complains of constant holocranial pressure headaches, sometimes exacerbated to pounding headaches, she denies significant light sensitivity, does has nausea, noise sensitivity when the headache is severe, extra strength Tylenol only help it temporarily,  She also had repeat sleep study per patient, was told she has transient hypoxia using CPAP machine during her sleep, was suggested oxygen use at night time, but was denied by her insurance company  She also complains of excessive fatigue, bilateral feet paresthesia due to 2 diabetes. Orthostatic dizziness Taking gabapentin 600 mg 3 times a day, she denied chewing difficulty.  UPDATE Oct 7th 2015: She complains of constant daily vortex area 7/10 pressure headaches, she also complains of intolerable side effects from Topamax, including dizziness, to the point of gait difficulty, it did not help her headaches either  We have reviewed MRI of the brain, which has showed moderate atrophy, periventricular white matter disease.  REVIEW OF SYSTEMS: Full 14 system review of systems performed and notable only for appetite change, ear pain, ringing in the ears, eye pain, blurred vision  ALLERGIES: No Known Allergies  HOME  MEDICATIONS: Current Outpatient Prescriptions on File Prior to Visit  Medication Sig Dispense Refill  . albuterol (PROVENTIL HFA;VENTOLIN HFA) 108 (90 BASE) MCG/ACT inhaler Inhale 2 puffs into the lungs every 4 (four) hours as needed. For shortness of breath  3 Inhaler  1  . amLODipine (NORVASC) 5 MG tablet Once a day      . aspirin 81 MG tablet Take 81 mg by mouth daily.      . budesonide-formoterol (SYMBICORT) 160-4.5 MCG/ACT inhaler Inhale 2 puffs into the lungs 2 (two) times daily.  3 Inhaler  1  . divalproex (DEPAKOTE ER) 500 MG 24 hr tablet Take 1,000 mg by mouth at bedtime.       . fluticasone (FLONASE) 50 MCG/ACT nasal spray USE 2 SPRAYS NASALLY DAILY  48 g  3  . fluticasone (FLONASE) 50 MCG/ACT nasal spray USE 2 SPRAYS NASALLY DAILY  48 g  1  . gabapentin (NEURONTIN) 600 MG tablet Take 600 mg by mouth 3 (three) times daily.       . insulin glargine (LANTUS) 100 UNIT/ML injection Inject 100 Units into the skin 2 (two) times daily.       Marland Kitchen levothyroxine (SYNTHROID, LEVOTHROID) 200 MCG tablet Take 200 mcg by mouth daily before breakfast.      . meclizine (ANTIVERT) 25 MG tablet 25 mg daily.      . metFORMIN (GLUCOPHAGE-XR) 500 MG 24 hr tablet Take 2 tablets by mouth 2 (two) times daily.      . metoprolol (LOPRESSOR) 50 MG tablet Take 25 mg by mouth daily.       . permethrin (ELIMITE) 5 % cream       .  simvastatin (ZOCOR) 20 MG tablet Take 1 tablet by mouth daily.      Marland Kitchen spironolactone (ALDACTONE) 50 MG tablet As needed      . topiramate (TOPAMAX) 50 MG tablet One po bid xone week, then 2 tabs po bid  120 tablet  11  . traMADol (ULTRAM) 50 MG tablet Take 1 tablet (50 mg total) by mouth every 12 (twelve) hours as needed.  60 tablet  5  . TRICOR 145 MG tablet Take 1 tablet by mouth daily.      . valsartan-hydrochlorothiazide (DIOVAN-HCT) 320-25 MG per tablet Take 1 tablet by mouth daily.      Marland Kitchen ZETIA 10 MG tablet 10 mg daily.      . nitroGLYCERIN (NITROSTAT) 0.4 MG SL tablet Place 1 tablet  (0.4 mg total) under the tongue every 5 (five) minutes as needed for chest pain.  25 tablet  6   No current facility-administered medications on file prior to visit.    PAST MEDICAL HISTORY: Past Medical History  Diagnosis Date  . GERD (gastroesophageal reflux disease)   . Diverticula of colon   . CAD (coronary artery disease)     Mild per remote cath in 2006, /    Nuclear, January, 2013, no significant abnormality,  . Hyperlipidemia   . Pulmonary hypertension, secondary   . Leg edema     occasional swelling  . Goiter     hx of  . Obesity   . Diabetes mellitus, type 2   . Hypothyroidism   . Left ventricular hypertrophy   . Carotid artery disease     Doppler, January, 2013, 0 73% RIC A., 71-06% LICA, stable  . Dizziness     occasional  . COPD (chronic obstructive pulmonary disease)   . Headache(784.0)     migraine  . Arthritis   . Complication of anesthesia     wakes up "mean"  . PONV (postoperative nausea and vomiting)   . OSA (obstructive sleep apnea)     cpap setting of 12    PAST SURGICAL HISTORY: Past Surgical History  Procedure Laterality Date  . Thyroidectomy  2011  . Back surgery  1990's    lower back  . Total knee arthroplasty  07/27/2011    Procedure: TOTAL KNEE ARTHROPLASTY;  Surgeon: Mauri Pole, MD;  Location: [facility];  Service: Orthopedics;  Laterality: Left;  . Abdominal hysterectomy      FAMILY HISTORY: Family History  Problem Relation Age of Onset  . Heart Problems Mother   . Heart Problems Father     SOCIAL HISTORY:  History   Social History  . Marital Status: Married    Spouse Name: Jeneen Rinks    Number of Children: 3  . Years of Education: 12   Occupational History  . retired    Social History Main Topics  . Smoking status: Former Smoker -- 1.50 packs/day for 34 years    Types: Cigarettes    Quit date: 05/04/1997  . Smokeless tobacco: Never Used  . Alcohol Use: No  . Drug Use: No  . Sexual Activity: Not on file   Other Topics  Concern  . Not on file   Social History Narrative   Patient lives at home with her husband Jeneen Rinks .   Retired.   Education 12 th grade.   Right handed   Caffeine two cups of coffee.   PHYSICAL EXAM   Filed Vitals:   02/07/14 0818  BP: 142/59  Pulse: 60  Height:  '5\' 6"'  (1.676 m)  Weight: 245 lb (111.131 kg)    Not recorded    Body mass index is 39.56 kg/(m^2).   Generalized: In no acute distress  Neck: Supple, no carotid bruits   Cardiac: Regular rate rhythm  Pulmonary: Clear to auscultation bilaterally  Musculoskeletal: No deformity  Neurological examination  Mentation: Depressed looking elderly female  Cranial nerve II-XII: Pupils were equal round reactive to light. Extraocular movements were full.  Visual field were full on confrontational test. Bilateral fundi were sharp.  Facial sensation and strength were normal. Hearing was intact to finger rubbing bilaterally. Uvula tongue midline.  Head turning and shoulder shrug and were normal and symmetric.Tongue protrusion into cheek strength was normal.  Motor: Normal tone, bulk and strength.  Sensory: length dependent decreased fine touch, pinprick to mid shin level, absent  vibratory sensation to mid shin, and preserved proprioception at toes.  Coordination: Normal finger to nose, heel-to-shin bilaterally there was no truncal ataxia  Gait: Rising up from seated position without assistance, normal stance, without trunk ataxia, moderate stride, good arm swing Romberg signs: Negative  Deep tendon reflexes: Brachioradialis 2/2, biceps 2/2, triceps 2/2, patellar 2/2, Achilles trace plantar responses were flexor bilaterally.   DIAGNOSTIC DATA (LABS, IMAGING, TESTING) - I reviewed patient records, labs, notes, testing and imaging myself where available.  Lab Results  Component Value Date   WBC 7.5 05/30/2013   HGB 11.2* 05/30/2013   HCT 34.7* 05/30/2013   MCV 91.6 05/30/2013   PLT 330 05/30/2013      Component Value  Date/Time   NA 140 05/30/2013 1425   K 5.4* 05/30/2013 1425   CL 100 05/30/2013 1425   CO2 23 05/30/2013 1425   GLUCOSE 194* 05/30/2013 1425   BUN 44* 05/30/2013 1425   CREATININE 1.53* 05/30/2013 1425   CALCIUM 8.8 05/30/2013 1425   CALCIUM 9.9 06/04/2008 2240   GFRNONAA 33* 05/30/2013 1425   GFRAA 38* 05/30/2013 1425    Lab Results  Component Value Date   TSH 48.02* 06/04/2008    ASSESSMENT AND PLAN  ABBI MANCINI is a 73 y.o. female complains of  frequent headaches, diabetic peripheral neuropathy, obesity on examinations, her headache is most consistent with tension headaches, could be related hypoxia during nighttime sleep, also need to rule out structural lesion, inflammatory process    1, ESR C. reactive protein 2. She could not tolerate Topamax, which did not help her headache either, will tried nortriptyline, one tablet titrating to 2 tablets every night as preventive medications, 3. I also encouraged her moderate exercise, 4, avoid daily analgesic use   5. Return to clinic in one to 2 months with nurse practitioner   Marcial Pacas, M.D. Ph.D.  [facility] [address] [place], [place] [address] [phone]

## 2014-02-08 LAB — SEDIMENTATION RATE: Sed Rate: 33 mm/hr (ref 0–40)

## 2014-02-08 LAB — C-REACTIVE PROTEIN: CRP: 3.4 mg/L (ref 0.0–4.9)

## 2014-02-08 NOTE — Progress Notes (Signed)
Quick Note:  Called and spoke to patient normal labs. Patient understood. ______

## 2014-02-19 ENCOUNTER — Ambulatory Visit (HOSPITAL_BASED_OUTPATIENT_CLINIC_OR_DEPARTMENT_OTHER): Payer: Medicare Other | Attending: Pulmonary Disease | Admitting: Radiology

## 2014-02-19 VITALS — Ht 66.0 in | Wt 240.0 lb

## 2014-02-19 DIAGNOSIS — G4733 Obstructive sleep apnea (adult) (pediatric): Secondary | ICD-10-CM

## 2014-02-19 DIAGNOSIS — Z9989 Dependence on other enabling machines and devices: Principal | ICD-10-CM

## 2014-02-23 ENCOUNTER — Telehealth: Payer: Self-pay | Admitting: Pulmonary Disease

## 2014-02-23 DIAGNOSIS — G4733 Obstructive sleep apnea (adult) (pediatric): Secondary | ICD-10-CM

## 2014-02-23 DIAGNOSIS — Z9989 Dependence on other enabling machines and devices: Principal | ICD-10-CM

## 2014-02-23 NOTE — Sleep Study (Signed)
[place]  NAME: Kayla [name] DATE OF BIRTH:  1941/04/18 MEDICAL RECORD NUMBER [id number]  LOCATION: [facility]  PHYSICIAN: Chesley Mires, M.D. DATE OF STUDY: 02/19/2014  SLEEP STUDY TYPE: CPAP titration.               REFERRING PHYSICIAN: Chesley Mires, MD  INDICATION FOR STUDY:  Kayla [name] is a 73 y.o. female who has a history of OSA and COPD.  She has been on CPAP therapy, but developed progressive daytime sleepiness, headaches, and overnight oximetry showed oxygen desaturation while on CPAP.  She returns to the sleep lab for a titration study.  EPWORTH SLEEPINESS SCORE: 6. HEIGHT: 5\' 6"  (167.6 cm)  WEIGHT: 240 lb (108.863 kg)    Body mass index is 38.76 kg/(m^2).  NECK SIZE: 16 in.  MEDICATIONS:  Current Outpatient Prescriptions on File Prior to Visit  Medication Sig Dispense Refill  . albuterol (PROVENTIL HFA;VENTOLIN HFA) 108 (90 BASE) MCG/ACT inhaler Inhale 2 puffs into the lungs every 4 (four) hours as needed. For shortness of breath  3 Inhaler  1  . amLODipine (NORVASC) 5 MG tablet Once a day      . aspirin 81 MG tablet Take 81 mg by mouth daily.      . budesonide-formoterol (SYMBICORT) 160-4.5 MCG/ACT inhaler Inhale 2 puffs into the lungs 2 (two) times daily.  3 Inhaler  1  . fluticasone (FLONASE) 50 MCG/ACT nasal spray USE 2 SPRAYS NASALLY DAILY  48 g  3  . fluticasone (FLONASE) 50 MCG/ACT nasal spray USE 2 SPRAYS NASALLY DAILY  48 g  1  . gabapentin (NEURONTIN) 600 MG tablet Take 600 mg by mouth 3 (three) times daily.       . insulin glargine (LANTUS) 100 UNIT/ML injection Inject 100 Units into the skin 2 (two) times daily.       Marland Kitchen levothyroxine (SYNTHROID, LEVOTHROID) 200 MCG tablet Take 200 mcg by mouth daily before breakfast.      . meclizine (ANTIVERT) 25 MG tablet 25 mg daily.      . metFORMIN (GLUCOPHAGE-XR) 500 MG 24 hr tablet Take 2 tablets by mouth 2 (two) times daily.      . metoprolol (LOPRESSOR) 50 MG  tablet Take 25 mg by mouth daily.       . nitroGLYCERIN (NITROSTAT) 0.4 MG SL tablet Place 1 tablet (0.4 mg total) under the tongue every 5 (five) minutes as needed for chest pain.  25 tablet  6  . nortriptyline (PAMELOR) 10 MG capsule One po qhs xone week, then 2 tabs po qhs  60 capsule  11  . permethrin (ELIMITE) 5 % cream       . simvastatin (ZOCOR) 20 MG tablet Take 1 tablet by mouth daily.      Marland Kitchen spironolactone (ALDACTONE) 50 MG tablet As needed      . traMADol (ULTRAM) 50 MG tablet Take 1 tablet (50 mg total) by mouth every 12 (twelve) hours as needed.  60 tablet  5  . TRICOR 145 MG tablet Take 1 tablet by mouth daily.      . valsartan-hydrochlorothiazide (DIOVAN-HCT) 320-25 MG per tablet Take 1 tablet by mouth daily.      Marland Kitchen ZETIA 10 MG tablet 10 mg daily.       No current facility-administered medications on file prior to visit.    SLEEP ARCHITECTURE:  Total recording time: 388 minutes.  Total sleep time was: 305 minutes.  Sleep efficiency: 78.6%.  Sleep latency: 19 minutes.  REM latency: 192.5 minutes.  Stage N1: 8%.  Stage N2: 85.9%.  Stage N3: 0.2%.  Stage R:  5.9%.  Supine sleep: 2.5 minutes.  Non-supine sleep: 302.5 minutes.  CARDIAC DATA:  Average heart rate: 69 beats per minute. Rhythm strip: sinus rhythm with PVC's.  RESPIRATORY DATA: Average respiratory rate: 18. Snoring: moderate.  She was started on CPAP 9 cm H2O and increased to 12 cm H2O.  With CPAP at 11 cm H2O her AHI was 0.7.  She was observed in REM sleep at this pressure setting.  MOVEMENT/PARASOMNIA:  Periodic limb movement: 81.4.  Period limb movements with arousals: 10.6. Restroom trips: one.  OXYGEN DATA:  Baseline oxygenation: 95%. Lowest SaO2: 88%. Time spent below SaO2 90%: 1.5 minutes. Supplemental oxygen used: none.  IMPRESSION/ RECOMMENDATION:   This was a successful CPAP titration study.  She did well with CPAP 11 cm H2O.  She was fitted with a ResMed medium size N-10 nasal pillow  mask.  She had a significant increase in her periodic limb movement index.  Clinical correlation needed to determine significance of this.  She maintained her oxygenation w/o supplemental oxygen once she was on appropriate CPAP setting.  Of note is that she has lost about 20 lbs since her previous sleep study from 12/21/13, and this weight loss likely contributed to her improved oxygenation while asleep.   Chesley Mires, M.D. Diplomate, [employer] of [employer] Medicine  ELECTRONICALLY SIGNED ON:  02/23/2014, 8:56 AM [place] PH: [phone]   FX: [phone] [place]

## 2014-02-23 NOTE — Telephone Encounter (Signed)
CPAP titration 02/19/14 >> CPAP 11 cm H2O >> AHI 0.7, PLMI 81.4, did not need supplemental oxygen   Pt was not available.  Spoke with pt's husband.  Informed him that she did well with CPAP 11 cm H2O, and did not need supplemental oxygen >> likely due to 20 lbs weight loss.  He will convey this information to his wife, and advised she can call back with questions.

## 2014-02-26 DIAGNOSIS — Z23 Encounter for immunization: Secondary | ICD-10-CM | POA: Diagnosis not present

## 2014-03-04 ENCOUNTER — Encounter (HOSPITAL_BASED_OUTPATIENT_CLINIC_OR_DEPARTMENT_OTHER): Payer: Medicare Other

## 2014-03-06 DIAGNOSIS — R300 Dysuria: Secondary | ICD-10-CM | POA: Diagnosis not present

## 2014-03-07 DIAGNOSIS — R300 Dysuria: Secondary | ICD-10-CM | POA: Diagnosis not present

## 2014-03-15 DIAGNOSIS — E559 Vitamin D deficiency, unspecified: Secondary | ICD-10-CM | POA: Diagnosis not present

## 2014-03-15 DIAGNOSIS — M545 Low back pain: Secondary | ICD-10-CM | POA: Diagnosis not present

## 2014-03-15 DIAGNOSIS — Z23 Encounter for immunization: Secondary | ICD-10-CM | POA: Diagnosis not present

## 2014-03-15 DIAGNOSIS — E1149 Type 2 diabetes mellitus with other diabetic neurological complication: Secondary | ICD-10-CM | POA: Diagnosis not present

## 2014-03-15 DIAGNOSIS — E782 Mixed hyperlipidemia: Secondary | ICD-10-CM | POA: Diagnosis not present

## 2014-03-15 DIAGNOSIS — E890 Postprocedural hypothyroidism: Secondary | ICD-10-CM | POA: Diagnosis not present

## 2014-03-15 DIAGNOSIS — Z79899 Other long term (current) drug therapy: Secondary | ICD-10-CM | POA: Diagnosis not present

## 2014-03-15 DIAGNOSIS — I10 Essential (primary) hypertension: Secondary | ICD-10-CM | POA: Diagnosis not present

## 2014-03-15 DIAGNOSIS — F419 Anxiety disorder, unspecified: Secondary | ICD-10-CM | POA: Diagnosis not present

## 2014-05-09 DIAGNOSIS — E890 Postprocedural hypothyroidism: Secondary | ICD-10-CM | POA: Diagnosis not present

## 2014-05-16 ENCOUNTER — Ambulatory Visit: Payer: Medicare Other | Admitting: Nurse Practitioner

## 2014-06-06 ENCOUNTER — Telehealth: Payer: Self-pay | Admitting: Pulmonary Disease

## 2014-06-06 MED ORDER — BUDESONIDE-FORMOTEROL FUMARATE 160-4.5 MCG/ACT IN AERO: 2.0000 | INHALATION_SPRAY | Freq: Two times a day (BID) | RESPIRATORY_TRACT | Status: DC

## 2014-06-06 NOTE — Telephone Encounter (Signed)
Spoke with [facility] pharmacist and given refill for Symbicort.  Instructed that pt is due for f/u with Dr Halford Chessman when she returns.

## 2014-06-25 ENCOUNTER — Ambulatory Visit (INDEPENDENT_AMBULATORY_CARE_PROVIDER_SITE_OTHER): Payer: Medicare Other | Admitting: Pulmonary Disease

## 2014-06-25 ENCOUNTER — Encounter: Payer: Self-pay | Admitting: Pulmonary Disease

## 2014-06-25 VITALS — BP 110/60 | HR 80 | Temp 98.2°F | Ht 66.0 in | Wt 249.4 lb

## 2014-06-25 DIAGNOSIS — G4733 Obstructive sleep apnea (adult) (pediatric): Secondary | ICD-10-CM | POA: Diagnosis not present

## 2014-06-25 DIAGNOSIS — J449 Chronic obstructive pulmonary disease, unspecified: Secondary | ICD-10-CM

## 2014-06-25 DIAGNOSIS — Z9989 Dependence on other enabling machines and devices: Principal | ICD-10-CM

## 2014-06-25 DIAGNOSIS — J310 Chronic rhinitis: Secondary | ICD-10-CM

## 2014-06-25 MED ORDER — ALBUTEROL SULFATE HFA 108 (90 BASE) MCG/ACT IN AERS: 2.0000 | INHALATION_SPRAY | Freq: Four times a day (QID) | RESPIRATORY_TRACT | Status: DC | PRN

## 2014-06-25 NOTE — Progress Notes (Signed)
Chief Complaint  Patient presents with  . Follow-up    Pt states that breathing has been doing well. Lost 14lbs, reports that breathing has improved. Pt requests PROAIR refill not Proventil.     History of Present Illness: Kayla [name] is a 74 y.o. female with COPD and OSA using CPAP.  Her breathing has been doing better.  She has lost weight since last visit.  She is using CPAP >> likes auto CPAP setting better.  She is not having issues with her breathing, but did have flare up with a cold few weeks ago.  She used her albuterol nebulizer and this helped.  She likes proair better than proventil.  She is using symbicort.   TESTS: PSG 02/2005 >> AHI 10  Spirometry 10/19/08 >> FEV1 1.55 (68%), FEV1% 70  Spirometry 08/29/12 >> FEV1 1.38 (59%), FEV1% 58 Auto CPAP 09/21/12 to 10/05/12 >> used on 15 of 15 nights with average 10 hrs 22 min.  Average AHI 1.8 with mean CPAP 5 cm H2O and 90 th percentile CPAP 6 cm H2O. CPAP titration 02/19/14 >> CPAP 11 cm H2O >> AHI 0.7, PLMI 81.4, did not need supplemental oxygen  PMHx >> GERD, HLD, DM, hypothyroidism, CAD   PSHx, Medications, Allergies, Fhx, Shx reviewed.  Physical Exam: Blood pressure 110/60, pulse 80, temperature 98.2 F (36.8 C), temperature source Oral, height 5\' 6"  (1.676 m), weight 249 lb 6.4 oz (113.127 kg), SpO2 94 %. Body mass index is 40.27 kg/(m^2).  General - Obese HEENT - wears glasses, pupils equal/reactive, no sinus tenderness, no oral exudate, no LAN Cardiac - s1s2 regular, no murmur Chest - no wheeze/rales Abdomen - soft, nontender Extremities - no edema Skin - no rashes Neurologic - normal strength, CN intact Psychiatric - normal mood, behavior  Assessment/Plan:  Obstructive sleep apnea. Plan: - continue auto CPAP range 5 to 15 cm H2O  COPD with chronic bronchitis. Plan: - continue symbicort with prn albuterol >> she prefers proair  Obesity. Oxygenation improved with weight loss. Plan: - continue weight  loss efforts   Chesley Mires, MD [place] 06/25/2014, 4:46 PM Pager:  [phone] After 3pm call: [phone]

## 2014-06-25 NOTE — Patient Instructions (Signed)
Follow up in 6 months 

## 2014-06-27 ENCOUNTER — Telehealth: Payer: Self-pay | Admitting: Pulmonary Disease

## 2014-06-27 MED ORDER — ALBUTEROL SULFATE HFA 108 (90 BASE) MCG/ACT IN AERS: 2.0000 | INHALATION_SPRAY | Freq: Four times a day (QID) | RESPIRATORY_TRACT | Status: DC | PRN

## 2014-06-27 NOTE — Telephone Encounter (Signed)
Calling back the nurse [phone]

## 2014-06-27 NOTE — Telephone Encounter (Signed)
Called and spoke with pt and she stated that [facility]  And she does not use this pharmacy.  [facility] needed this filled.  This has been done and pharmacies have been updated.  Nothing further is needed.

## 2014-06-27 NOTE — Telephone Encounter (Signed)
lmtcb x1 

## 2014-07-04 DIAGNOSIS — E890 Postprocedural hypothyroidism: Secondary | ICD-10-CM | POA: Diagnosis not present

## 2014-07-20 ENCOUNTER — Telehealth: Payer: Self-pay | Admitting: Pulmonary Disease

## 2014-07-20 ENCOUNTER — Other Ambulatory Visit: Payer: Self-pay | Admitting: Pulmonary Disease

## 2014-07-20 MED ORDER — BUDESONIDE-FORMOTEROL FUMARATE 160-4.5 MCG/ACT IN AERO: 2.0000 | INHALATION_SPRAY | Freq: Two times a day (BID) | RESPIRATORY_TRACT | Status: DC

## 2014-07-20 NOTE — Telephone Encounter (Signed)
Rx's have been sent in. Pt is aware. Nothing further was needed. 

## 2014-07-26 ENCOUNTER — Other Ambulatory Visit: Payer: Self-pay | Admitting: Neurosurgery

## 2014-07-26 DIAGNOSIS — M5023 Other cervical disc displacement, cervicothoracic region: Secondary | ICD-10-CM | POA: Diagnosis not present

## 2014-08-15 ENCOUNTER — Other Ambulatory Visit: Payer: Self-pay | Admitting: Family Medicine

## 2014-08-15 DIAGNOSIS — Z9181 History of falling: Secondary | ICD-10-CM | POA: Diagnosis not present

## 2014-08-15 DIAGNOSIS — Z1389 Encounter for screening for other disorder: Secondary | ICD-10-CM | POA: Diagnosis not present

## 2014-08-15 DIAGNOSIS — E890 Postprocedural hypothyroidism: Secondary | ICD-10-CM | POA: Diagnosis not present

## 2014-08-15 DIAGNOSIS — E1149 Type 2 diabetes mellitus with other diabetic neurological complication: Secondary | ICD-10-CM | POA: Diagnosis not present

## 2014-08-15 DIAGNOSIS — E782 Mixed hyperlipidemia: Secondary | ICD-10-CM | POA: Diagnosis not present

## 2014-08-15 DIAGNOSIS — F419 Anxiety disorder, unspecified: Secondary | ICD-10-CM | POA: Diagnosis not present

## 2014-08-15 DIAGNOSIS — Z1231 Encounter for screening mammogram for malignant neoplasm of breast: Secondary | ICD-10-CM

## 2014-08-15 DIAGNOSIS — M8580 Other specified disorders of bone density and structure, unspecified site: Secondary | ICD-10-CM

## 2014-08-15 DIAGNOSIS — Z6839 Body mass index (BMI) 39.0-39.9, adult: Secondary | ICD-10-CM | POA: Diagnosis not present

## 2014-08-15 DIAGNOSIS — I10 Essential (primary) hypertension: Secondary | ICD-10-CM | POA: Diagnosis not present

## 2014-08-16 ENCOUNTER — Ambulatory Visit
Admission: RE | Admit: 2014-08-16 | Discharge: 2014-08-16 | Disposition: A | Discharge status: Home / Self Care | Payer: Medicare Other | Source: Ambulatory Visit | Attending: Neurosurgery | Admitting: Neurosurgery

## 2014-08-16 DIAGNOSIS — M5021 Other cervical disc displacement,  high cervical region: Secondary | ICD-10-CM | POA: Diagnosis not present

## 2014-08-16 DIAGNOSIS — M5023 Other cervical disc displacement, cervicothoracic region: Secondary | ICD-10-CM

## 2014-08-16 DIAGNOSIS — M47812 Spondylosis without myelopathy or radiculopathy, cervical region: Secondary | ICD-10-CM | POA: Diagnosis not present

## 2014-08-16 DIAGNOSIS — M9971 Connective tissue and disc stenosis of intervertebral foramina of cervical region: Secondary | ICD-10-CM | POA: Diagnosis not present

## 2014-08-21 DIAGNOSIS — Z6839 Body mass index (BMI) 39.0-39.9, adult: Secondary | ICD-10-CM | POA: Diagnosis not present

## 2014-08-21 DIAGNOSIS — I10 Essential (primary) hypertension: Secondary | ICD-10-CM | POA: Diagnosis not present

## 2014-08-21 DIAGNOSIS — M5023 Other cervical disc displacement, cervicothoracic region: Secondary | ICD-10-CM | POA: Diagnosis not present

## 2014-08-29 ENCOUNTER — Ambulatory Visit
Admission: RE | Admit: 2014-08-29 | Discharge: 2014-08-29 | Disposition: A | Discharge status: Home / Self Care | Payer: Medicare Other | Source: Ambulatory Visit | Attending: Family Medicine | Admitting: Family Medicine

## 2014-08-29 ENCOUNTER — Ambulatory Visit: Payer: Medicare Other

## 2014-08-29 DIAGNOSIS — Z1231 Encounter for screening mammogram for malignant neoplasm of breast: Secondary | ICD-10-CM

## 2014-08-29 DIAGNOSIS — Z780 Asymptomatic menopausal state: Secondary | ICD-10-CM | POA: Diagnosis not present

## 2014-08-29 DIAGNOSIS — Z13820 Encounter for screening for osteoporosis: Secondary | ICD-10-CM | POA: Diagnosis not present

## 2014-08-29 DIAGNOSIS — M8580 Other specified disorders of bone density and structure, unspecified site: Secondary | ICD-10-CM

## 2014-09-11 DIAGNOSIS — M5412 Radiculopathy, cervical region: Secondary | ICD-10-CM | POA: Diagnosis not present

## 2014-09-11 DIAGNOSIS — E119 Type 2 diabetes mellitus without complications: Secondary | ICD-10-CM | POA: Diagnosis not present

## 2014-09-11 DIAGNOSIS — M9981 Other biomechanical lesions of cervical region: Secondary | ICD-10-CM | POA: Diagnosis not present

## 2014-09-12 DIAGNOSIS — M5412 Radiculopathy, cervical region: Secondary | ICD-10-CM | POA: Diagnosis not present

## 2014-10-02 DIAGNOSIS — M5023 Other cervical disc displacement, cervicothoracic region: Secondary | ICD-10-CM | POA: Diagnosis not present

## 2014-10-02 DIAGNOSIS — Z6839 Body mass index (BMI) 39.0-39.9, adult: Secondary | ICD-10-CM | POA: Diagnosis not present

## 2014-10-02 DIAGNOSIS — I10 Essential (primary) hypertension: Secondary | ICD-10-CM | POA: Diagnosis not present

## 2014-10-03 ENCOUNTER — Other Ambulatory Visit: Payer: Self-pay | Admitting: Neurosurgery

## 2014-10-08 ENCOUNTER — Telehealth: Payer: Self-pay | Admitting: Pulmonary Disease

## 2014-10-08 NOTE — Telephone Encounter (Signed)
lmomtcb x1 

## 2014-10-09 ENCOUNTER — Ambulatory Visit (INDEPENDENT_AMBULATORY_CARE_PROVIDER_SITE_OTHER): Payer: Medicare Other | Admitting: Physician Assistant

## 2014-10-09 ENCOUNTER — Encounter: Payer: Self-pay | Admitting: Physician Assistant

## 2014-10-09 VITALS — BP 152/60 | HR 63 | Ht 66.0 in | Wt 243.8 lb

## 2014-10-09 DIAGNOSIS — I2510 Atherosclerotic heart disease of native coronary artery without angina pectoris: Secondary | ICD-10-CM

## 2014-10-09 DIAGNOSIS — I517 Cardiomegaly: Secondary | ICD-10-CM

## 2014-10-09 DIAGNOSIS — R002 Palpitations: Secondary | ICD-10-CM | POA: Diagnosis not present

## 2014-10-09 DIAGNOSIS — I779 Disorder of arteries and arterioles, unspecified: Secondary | ICD-10-CM

## 2014-10-09 DIAGNOSIS — Z01818 Encounter for other preprocedural examination: Secondary | ICD-10-CM

## 2014-10-09 DIAGNOSIS — I739 Peripheral vascular disease, unspecified: Secondary | ICD-10-CM

## 2014-10-09 MED ORDER — NITROGLYCERIN 0.4 MG SL SUBL: 0.4000 mg | SUBLINGUAL_TABLET | SUBLINGUAL | Status: DC | PRN

## 2014-10-09 NOTE — Telephone Encounter (Signed)
Pt returning call.Kayla [name] ° °

## 2014-10-09 NOTE — Assessment & Plan Note (Signed)
Nonbstructive CAD on cath in 2006, with low risk Lexi scan Myoview in 2013. No new symptoms.

## 2014-10-09 NOTE — Assessment & Plan Note (Signed)
She's here today for preoperative cardiac clearance before undergoing cervical neck surgery 11/07/14 by Dr. Saintclair Halsted. She has history of nonobstructive CAD on cath in 2006 and low risk Lexi scan Myoview in 2013. She has chronic dyspnea on exertion that is unchanged and is followed by Dr. Halford Chessman. She has hypertension, diabetes mellitus, COPD, obesity, and hyperlipidemia. She has no cardiac symptoms. She has normal LV function on 2-D echo 07/2013 with grade 1 diastolic dysfunction. I discussed this patient detail with Dr. Lovena Le who concurs that the patient can proceed with surgery without further cardiac testing.

## 2014-10-09 NOTE — Assessment & Plan Note (Signed)
Chronic  dyspnea on exertion followed by Dr.Sood.

## 2014-10-09 NOTE — Telephone Encounter (Signed)
Called and spoke to pt. Pt stated she is having a vertebral fusion (unsure of which vertebrae) on 11/07/2014 by Dr. Kary Kos. Pt questioning if she can be cleared without OV because pt was seen in 06/2014. VS does not have any openings before pt's scheduled surgery date.   VS please advise. Thanks.

## 2014-10-09 NOTE — Assessment & Plan Note (Signed)
History of PACs and PVCs. Not Bothersome at the time.

## 2014-10-09 NOTE — Patient Instructions (Signed)
Medication Instructions:   NITROGLYCERIN REFILLED   Labwork:   Testing/Procedures:   Follow-Up:  WITH DR Ron Parker IN 3 MONTHS OR PUT ON RECALL   Any Other Special Instructions Will Be Listed Below (If Applicable).

## 2014-10-09 NOTE — Telephone Encounter (Signed)
LMTCB x 1 Needs appt with VS for surgical clearance - surgery is scheduled for 11/07/14

## 2014-10-09 NOTE — Assessment & Plan Note (Signed)
Carotid stable in 07/2013. Repeat in 2017.

## 2014-10-09 NOTE — Telephone Encounter (Signed)
She really needs to come in for ROV.  Can have this done with TP if I don't have anything open.

## 2014-10-09 NOTE — Progress Notes (Signed)
Cardiology Office Note   Date:  10/09/2014   ID:  Kayla [name], Delaet 03-07-1941, MRN [id number]  PCP:  [name], MD  Cardiologist: Dr. Ron Parker Chief Complaint: surgical clearance    History of Present Illness: Kayla [name] is a 74 y.o. female who presents for surgical clearance for cervical fusion 11/07/14 by Dr. Saintclair Halsted. She has history of nonobstructive CAD on cath in 2006, low risk Lexi scan Myoview in 2013, last 2-D echo 2015 normal LV function with mild LVH EF 55-60% with grade 1 diastolic dysfunction, 48 hour Holter in 2015 showing PACs and PVCs 3 beat run of PACs, carotid Dopplers 2015 [id number] R ICA, 123456 LICA, greater than [id number] RECA needs follow-up in 2 years. Patient has history of COPD secondary to long smoking history and is followed by Dr.Sood. Chronic dyspnea on exertion which has not changed. She has occasional leg swelling and take spironolactone 2-3 times per month. She also has hypertension and hyperlipidemia, diabetes mellitus, hypothyroidism.  Overall patient is doing well. Her blood pressure is up but she claims it's because she is in a lot of pain. She has chronic dyspnea on exertion that is unchanged. She has no resting dyspnea and says she can get around her house without any problems with dyspnea. She denies chest pain, palpitations, dizziness or presyncope.   Past Medical History  Diagnosis Date  . GERD (gastroesophageal reflux disease)   . Diverticula of colon   . CAD (coronary artery disease)     Mild per remote cath in 2006, /    Nuclear, January, 2013, no significant abnormality,  . Hyperlipidemia   . Pulmonary hypertension, secondary   . Leg edema     occasional swelling  . Goiter     hx of  . Obesity   . Diabetes mellitus, type 2   . Hypothyroidism   . Left ventricular hypertrophy   . Carotid artery disease     Doppler, January, 2013, 0 [id number] RIC A., 123456 LICA, stable  . Dizziness     occasional  . COPD (chronic obstructive pulmonary disease)    . Headache(784.0)     migraine  . Arthritis   . Complication of anesthesia     wakes up "mean"  . PONV (postoperative nausea and vomiting)   . OSA (obstructive sleep apnea)     cpap setting of 12    Past Surgical History  Procedure Laterality Date  . Thyroidectomy  2011  . Back surgery  1990's    lower back  . Total knee arthroplasty  07/27/2011    Procedure: TOTAL KNEE ARTHROPLASTY;  Surgeon: Mauri Pole, MD;  Location: [facility];  Service: Orthopedics;  Laterality: Left;  . Abdominal hysterectomy       Current Outpatient Prescriptions  Medication Sig Dispense Refill  . albuterol (PROAIR HFA) 108 (90 BASE) MCG/ACT inhaler Inhale 2 puffs into the lungs every 6 (six) hours as needed for wheezing or shortness of breath. 3 Inhaler 3  . amLODipine (NORVASC) 5 MG tablet Once a day    . aspirin 81 MG tablet Take 81 mg by mouth daily.    . budesonide-formoterol (SYMBICORT) 160-4.5 MCG/ACT inhaler Inhale 2 puffs into the lungs 2 (two) times daily. 3 Inhaler 3  . fenofibrate 160 MG tablet Take 160 mg by mouth daily.    . fluticasone (FLONASE) 50 MCG/ACT nasal spray USE 2 SPRAYS NASALLY DAILY 48 g 1  . gabapentin (NEURONTIN) 600 MG tablet Take 600 mg by mouth 3 (  three) times daily.     Marland Kitchen HYDROcodone-acetaminophen (NORCO/VICODIN) 5-325 MG per tablet Take 1 tablet by mouth as needed.    . insulin glargine (LANTUS) 100 UNIT/ML injection Inject 100 Units into the skin 2 (two) times daily.     Marland Kitchen levothyroxine (SYNTHROID, LEVOTHROID) 175 MCG tablet Take 175 mcg by mouth daily before breakfast.    . levothyroxine (SYNTHROID, LEVOTHROID) 200 MCG tablet Take 200 mcg by mouth daily before breakfast.    . LORazepam (ATIVAN) 2 MG tablet Take 2 mg by mouth daily as needed. FOR DEPRESSION    . meclizine (ANTIVERT) 25 MG tablet 25 mg daily.    . metFORMIN (GLUCOPHAGE-XR) 500 MG 24 hr tablet Take 2 tablets by mouth 2 (two) times daily.    . metoprolol (LOPRESSOR) 50 MG tablet Take 25 mg by mouth daily.      Marland Kitchen PHENADOZ 25 MG suppository Place 2.5 mg rectally daily as needed. (FOR HEADACHES)    . simvastatin (ZOCOR) 20 MG tablet Take 1 tablet by mouth daily.    Marland Kitchen spironolactone (ALDACTONE) 50 MG tablet As needed    . traMADol (ULTRAM) 50 MG tablet Take 50 mg by mouth every 12 (twelve) hours as needed for moderate pain.    . valsartan-hydrochlorothiazide (DIOVAN-HCT) 320-25 MG per tablet Take 1 tablet by mouth daily.    Marland Kitchen ZETIA 10 MG tablet Take 10 mg by mouth daily.     . nitroGLYCERIN (NITROSTAT) 0.4 MG SL tablet Place 1 tablet (0.4 mg total) under the tongue every 5 (five) minutes as needed for chest pain. 25 tablet 6   No current facility-administered medications for this visit.    Allergies:   Review of patient's allergies indicates no known allergies.    Social History:  The patient  reports that she quit smoking about 17 years ago. Her smoking use included Cigarettes. She has a 51 pack-year smoking history. She has never used smokeless tobacco. She reports that she does not drink alcohol or use illicit drugs.   Family History:  The patient's family history includes Heart Problems in her father and mother.    ROS:  Please see the history of present illness.   Otherwise, review of systems are positive for chronic back pain, headaches says of fatigue.   All other systems are reviewed and negative.    PHYSICAL EXAM: VS:  BP 152/60 mmHg  Pulse 63  Ht 5\' 6"  (1.676 m)  Wt 243 lb 12.8 oz (110.587 kg)  BMI 39.37 kg/m2 , BMI Body mass index is 39.37 kg/(m^2). GEN: Obese, well developed, in no acute distress Neck: Bilateral carotid bruits right greater than left no JVD, HJR,  or masses Cardiac: RRR; no murmurs,gallop, rubs, thrill or heave,  Respiratory:  Recent breath sounds but clear to auscultation bilaterally, normal work of breathing GI: soft, nontender, nondistended, + BS MS: no deformity or atrophy Extremities: without cyanosis, clubbing, edema, good distal pulses bilaterally.   Skin: warm and dry, no rash Neuro:  Strength and sensation are intact    EKG:  EKG is ordered today. The ekg ordered today demonstrates as rhythm with first-degree AV block   Recent Labs: No results found for requested labs within last 365 days.    Lipid Panel No results found for: CHOL, TRIG, HDL, CHOLHDL, VLDL, LDLCALC, LDLDIRECT    Wt Readings from Last 3 Encounters:  10/09/14 243 lb 12.8 oz (110.587 kg)  06/25/14 249 lb 6.4 oz (113.127 kg)  02/19/14 240 lb (108.863 kg)  Other studies Reviewed: Additional studies/ records that were reviewed today include and review of the records demonstrates:   CONCLUSION:  1.  Mild nonobstructive coronary artery disease with irregularities in the      left anterior descending, 30% narrowing in the distal segments, 30%      proximal and 30% mid stenosis in the right coronary artery with normal      left ventricular function.  2.  Moderate elevation of pulmonary artery wedge and pulmonary artery      pressures.    RECOMMENDATIONS:  In view of these findings, I think it is not likely that  the patient's chest pain is ischemic in etiology, and I think that most of  her symptoms are likely pulmonary.  She does have a mild elevation of  pulmonary wedge pressure which could be related to LVH from hypertensive  disease, and she may have some element of diastolic dysfunction.  Will plan  pulmonary consultation.  She does have a long history of smoking, and I  suspect that this is the major cause for her symptoms.  Will plan to get a 2-  D echocardiogram to evaluate her for diastolic function as an outpatient  later.              ______________________________  Eustace Quail, M.D. [facility]        BB/MEDQ  D:  01/12/2005  T:  01/12/2005  Job:  [id number]   Study Conclusions  - Left ventricle: The cavity size was normal. Wall thickness   was increased in a pattern of mild LVH. There was mild   focal basal hypertrophy of the septum.  Systolic function   was normal. The estimated ejection fraction was in the   range of 55% to 60%. Wall motion was normal; there were no   regional wall motion abnormalities. Doppler parameters are   consistent with abnormal left ventricular relaxation   (grade 1 diastolic dysfunction). - Left atrium: The atrium was mildly dilated. - Atrial septum: There was increased thickness of the   septum, consistent with lipomatous hypertrophy. Transthoracic echocardiography  ASSESSMENT AND PLAN:  Preoperative clearance She's here today for preoperative cardiac clearance before undergoing cervical neck surgery 11/07/14 by Dr. Saintclair Halsted. She has history of nonobstructive CAD on cath in 2006 and low risk Lexi scan Myoview in 2013. She has chronic dyspnea on exertion that is unchanged and is followed by Dr. Halford Chessman. She has hypertension, diabetes mellitus, COPD, obesity, and hyperlipidemia. She has no cardiac symptoms. She has normal LV function on 2-D echo 07/2013 with grade 1 diastolic dysfunction. I discussed this patient detail with Dr. Lovena Le who concurs that the patient can proceed with surgery without further cardiac testing.   Palpitations History of PACs and PVCs. Not Bothersome at the time.   Coronary atherosclerosis Nonbstructive CAD on cath in 2006, with low risk Lexi scan Myoview in 2013. No new symptoms.   COPD Chronic  dyspnea on exertion followed by Dr.Sood.   Carotid artery disease Carotid stable in 07/2013. Repeat in 2017.     Sumner Boast, PA-C  10/09/2014 10:17 AM    [facility] [place], [place], [place]  [address] Phone: [phone]; Fax: [phone]

## 2014-10-10 NOTE — Telephone Encounter (Signed)
Pt scheduled for sx clearance next wed with VS.  Nothing further needed.

## 2014-10-17 ENCOUNTER — Ambulatory Visit (INDEPENDENT_AMBULATORY_CARE_PROVIDER_SITE_OTHER): Payer: Medicare Other | Admitting: Pulmonary Disease

## 2014-10-17 ENCOUNTER — Encounter: Payer: Self-pay | Admitting: Pulmonary Disease

## 2014-10-17 VITALS — BP 132/82 | HR 68 | Ht 66.0 in | Wt 245.4 lb

## 2014-10-17 DIAGNOSIS — Z9989 Dependence on other enabling machines and devices: Principal | ICD-10-CM

## 2014-10-17 DIAGNOSIS — Z01811 Encounter for preprocedural respiratory examination: Secondary | ICD-10-CM

## 2014-10-17 DIAGNOSIS — G4733 Obstructive sleep apnea (adult) (pediatric): Secondary | ICD-10-CM | POA: Diagnosis not present

## 2014-10-17 DIAGNOSIS — J449 Chronic obstructive pulmonary disease, unspecified: Secondary | ICD-10-CM

## 2014-10-17 DIAGNOSIS — J209 Acute bronchitis, unspecified: Secondary | ICD-10-CM | POA: Diagnosis not present

## 2014-10-17 DIAGNOSIS — I2510 Atherosclerotic heart disease of native coronary artery without angina pectoris: Secondary | ICD-10-CM | POA: Diagnosis not present

## 2014-10-17 MED ORDER — AZITHROMYCIN 250 MG PO TABS: ORAL_TABLET | ORAL | Status: AC

## 2014-10-17 NOTE — Patient Instructions (Signed)
Zithromax 250 pill >> 2 pills on day 1, then 1 pill daily for next 4 days  Follow up in 6 months

## 2014-10-17 NOTE — Progress Notes (Signed)
Chief Complaint  Patient presents with  . Follow-up    Pt here for surgery clearance -scheduled July 6th- fusion of vertebrae (spurs)    History of Present Illness: Kayla [name] is a 74 y.o. female with COPD and OSA using CPAP.  She has been seen by Dr. Saintclair Halsted with neurosurgery.  She is being evaluated for surgery for C5-C6-C7.  Her surgery date is set for July 6.  This will be done at [facility].  She has been doing well with CPAP w/o issue.  Her breathing was okay until this past week.  She started getting sinus congestion, and then has more chest congestion.  She is coughing up yellow sputum.  She has been using her nebulizer more over the past few days.  She still use symbicort.   TESTS: PSG 02/2005 >> AHI 10  Spirometry 10/19/08 >> FEV1 1.55 (68%), FEV1% 70  Spirometry 08/29/12 >> FEV1 1.38 (59%), FEV1% 58 Auto CPAP 09/21/12 to 10/05/12 >> used on 15 of 15 nights with average 10 hrs 22 min.  Average AHI 1.8 with mean CPAP 5 cm H2O and 90 th percentile CPAP 6 cm H2O. CPAP titration 02/19/14 >> CPAP 11 cm H2O >> AHI 0.7, PLMI 81.4, did not need supplemental oxygen  PMHx >> GERD, HLD, DM, hypothyroidism, CAD  PSHx, Medications, Allergies, Fhx, Shx reviewed.  Physical Exam: Blood pressure 132/82, pulse 68, height 5\' 6"  (1.676 m), weight 245 lb 6.4 oz (111.313 kg), SpO2 96 %. Body mass index is 39.63 kg/(m^2).  General - Obese HEENT - wears glasses, pupils equal/reactive, no sinus tenderness, no oral exudate, no LAN Cardiac - s1s2 regular, no murmur Chest - no wheeze/rales Abdomen - soft, nontender Extremities - no edema Skin - no rashes Neurologic - normal strength, CN intact Psychiatric - normal mood, behavior  Assessment/Plan:  Obstructive sleep apnea. Plan: - continue auto CPAP range 5 to 15 cm H2O  COPD with chronic bronchitis. Plan: - continue symbicort with prn albuterol >> she prefers proair  Obesity. Plan: - continue weight loss efforts  Acute  bronchitis. Plan: - will give course of zithromax - I don't think she needs prednisone or chest xray at this time  Pre-operative respiratory evaluation. Discussion: She is being set up for cervical spine surgery.  Her respiratory risks are not prohibitive, and she can proceed with surgery. Plan: - continue CPAP post-op as able - continue inhaler therapy and bronchial hygiene post-op - she will need close monitoring of her airway and oxygenation in the post-op period - pulmonary service can be available as needed post-op   Chesley Mires, MD [place] 10/17/2014, 12:01 PM Pager:  [phone] After 3pm call: [phone]

## 2014-10-18 ENCOUNTER — Other Ambulatory Visit: Payer: Self-pay | Admitting: Pulmonary Disease

## 2014-10-23 ENCOUNTER — Telehealth: Payer: Self-pay | Admitting: Pulmonary Disease

## 2014-10-23 MED ORDER — METHYLPREDNISOLONE 4 MG PO TBPK: ORAL_TABLET | ORAL | Status: DC

## 2014-10-23 NOTE — Telephone Encounter (Signed)
Per SN- Call in medrol dose pack 4mg  #21, 6 day.    Called and spoke to pt. Informed her of the recs per SN. Rx sent to preferred pharmacy. Pt verbalized understanding and denied any further questions or concerns at this time.

## 2014-10-23 NOTE — Telephone Encounter (Signed)
Called and spoke to pt. Pt stated after she completed the zpak that was given by VS at last OV, she is feeling better but the issue has not resolved. Pt has an upcoming cervical spine surgery in 2 weeks and stated VS told her to call back if the s/s have not resolved. Pt c/o increase in SOB from baseline, cough with little mucus production- cream and light yellow in color, chest congestion, head congestion, and oral temperature of 99.0. Pt denies CP/tightness. Pt requesting recs. Will send to doc of day as VS is unavailable.   Dr. Lenna Gilford please advise. Thank you.   No Known Allergies  Current Outpatient Prescriptions on File Prior to Visit  Medication Sig Dispense Refill  . albuterol (PROAIR HFA) 108 (90 BASE) MCG/ACT inhaler Inhale 2 puffs into the lungs every 6 (six) hours as needed for wheezing or shortness of breath. 3 Inhaler 3  . amLODipine (NORVASC) 5 MG tablet Once a day    . aspirin 81 MG tablet Take 81 mg by mouth daily.    . budesonide-formoterol (SYMBICORT) 160-4.5 MCG/ACT inhaler Inhale 2 puffs into the lungs 2 (two) times daily. 3 Inhaler 3  . fenofibrate 160 MG tablet Take 160 mg by mouth daily.    . fluticasone (FLONASE) 50 MCG/ACT nasal spray USE 2 SPRAYS NASALLY DAILY 48 g 0  . gabapentin (NEURONTIN) 600 MG tablet Take 600 mg by mouth 3 (three) times daily.     Marland Kitchen HYDROcodone-acetaminophen (NORCO/VICODIN) 5-325 MG per tablet Take 1 tablet by mouth as needed.    . insulin glargine (LANTUS) 100 UNIT/ML injection Inject 100 Units into the skin 2 (two) times daily.     Marland Kitchen levothyroxine (SYNTHROID, LEVOTHROID) 175 MCG tablet Take 175 mcg by mouth daily before breakfast.    . levothyroxine (SYNTHROID, LEVOTHROID) 200 MCG tablet Take 200 mcg by mouth daily before breakfast.    . LORazepam (ATIVAN) 2 MG tablet Take 2 mg by mouth daily as needed. FOR DEPRESSION    . meclizine (ANTIVERT) 25 MG tablet 25 mg daily.    . metFORMIN (GLUCOPHAGE-XR) 500 MG 24 hr tablet Take 2 tablets by mouth 2  (two) times daily.    . metoprolol (LOPRESSOR) 50 MG tablet Take 25 mg by mouth daily.     . nitroGLYCERIN (NITROSTAT) 0.4 MG SL tablet Place 1 tablet (0.4 mg total) under the tongue every 5 (five) minutes as needed for chest pain. 25 tablet 3  . PHENADOZ 25 MG suppository Place 2.5 mg rectally daily as needed. (FOR HEADACHES)    . simvastatin (ZOCOR) 20 MG tablet Take 1 tablet by mouth daily.    Marland Kitchen spironolactone (ALDACTONE) 50 MG tablet As needed    . traMADol (ULTRAM) 50 MG tablet Take 50 mg by mouth every 12 (twelve) hours as needed for moderate pain.    . valsartan-hydrochlorothiazide (DIOVAN-HCT) 320-25 MG per tablet Take 1 tablet by mouth daily.    Marland Kitchen ZETIA 10 MG tablet Take 10 mg by mouth daily.      No current facility-administered medications on file prior to visit.  '

## 2014-10-23 NOTE — Telephone Encounter (Signed)
Message printed and placed on  SN cart for review  

## 2014-10-26 ENCOUNTER — Other Ambulatory Visit (HOSPITAL_COMMUNITY): Payer: Self-pay | Admitting: *Deleted

## 2014-10-26 NOTE — Progress Notes (Signed)
Called Dr. Windy Carina office to request that he sign pre-op orders, spoke with Lorriane Shire.

## 2014-10-26 NOTE — Pre-Procedure Instructions (Addendum)
    Kayla [name]  10/26/2014      Your procedure is scheduled on Wednesday, November 07, 2014 at 1:00 PM.   Report to [facility] Entrance "A" Admitting Office at 11:00 AM.   Call this number if you have problems the morning of surgery: [phone]   .dos    Remember:  Do not eat food or drink liquids after midnight Tuesday, 11/06/14.    Take these medicines the morning of surgery with A SIP OF WATER: Amlodipine (Norvasc), Gabapentin (Neurontin), Levothyroxine (Synthroid), Metoprolol (Lopressor), Flonase, Symbicort inhaler Lorazepam (Ativan) - if needed, Albuterol inhaler - if needed, Hydrocodone - if needed   Stop Aspirin 7 days prior to surgery. Do NOT take any diabetic medications the morning of surgery.   Do not wear jewelry, make-up or nail polish.  Do not wear lotions, powders, or perfumes.    Do not shave 48 hours prior to surgery.    Do not bring valuables to the hospital.  [facility] is not responsible for any belongings or valuables.  Contacts, dentures or bridgework may not be worn into surgery.  Leave your suitcase in the car.  After surgery it may be brought to your room.  For patients admitted to the hospital, discharge time will be determined by your treatment team.  Special instructions:  See "Preparing for Surgery" Instruction sheet.    Please read over the following fact sheets that you were given. Pain Booklet, Coughing and Deep Breathing, MRSA Information and Surgical Site Infection Prevention

## 2014-10-29 ENCOUNTER — Encounter (HOSPITAL_COMMUNITY): Payer: Self-pay

## 2014-10-29 ENCOUNTER — Encounter (HOSPITAL_COMMUNITY)
Admission: RE | Admit: 2014-10-29 | Discharge: 2014-10-29 | Disposition: A | Discharge status: Home / Self Care | Payer: Medicare Other | Source: Ambulatory Visit | Attending: Neurosurgery | Admitting: Neurosurgery

## 2014-10-29 DIAGNOSIS — G4733 Obstructive sleep apnea (adult) (pediatric): Secondary | ICD-10-CM | POA: Insufficient documentation

## 2014-10-29 DIAGNOSIS — Z01812 Encounter for preprocedural laboratory examination: Secondary | ICD-10-CM | POA: Diagnosis not present

## 2014-10-29 DIAGNOSIS — K219 Gastro-esophageal reflux disease without esophagitis: Secondary | ICD-10-CM | POA: Insufficient documentation

## 2014-10-29 DIAGNOSIS — Z01818 Encounter for other preprocedural examination: Secondary | ICD-10-CM | POA: Diagnosis not present

## 2014-10-29 DIAGNOSIS — I2510 Atherosclerotic heart disease of native coronary artery without angina pectoris: Secondary | ICD-10-CM | POA: Insufficient documentation

## 2014-10-29 DIAGNOSIS — J449 Chronic obstructive pulmonary disease, unspecified: Secondary | ICD-10-CM | POA: Diagnosis not present

## 2014-10-29 DIAGNOSIS — E785 Hyperlipidemia, unspecified: Secondary | ICD-10-CM | POA: Insufficient documentation

## 2014-10-29 DIAGNOSIS — E119 Type 2 diabetes mellitus without complications: Secondary | ICD-10-CM | POA: Diagnosis not present

## 2014-10-29 DIAGNOSIS — Z87891 Personal history of nicotine dependence: Secondary | ICD-10-CM | POA: Diagnosis not present

## 2014-10-29 DIAGNOSIS — E039 Hypothyroidism, unspecified: Secondary | ICD-10-CM | POA: Diagnosis not present

## 2014-10-29 LAB — SURGICAL PCR SCREEN
MRSA, PCR: NEGATIVE
Staphylococcus aureus: NEGATIVE

## 2014-10-29 LAB — BASIC METABOLIC PANEL
Anion gap: 7 (ref 5–15)
BUN: 29 mg/dL — ABNORMAL HIGH (ref 6–20)
CO2: 23 mmol/L (ref 22–32)
Calcium: 9.3 mg/dL (ref 8.9–10.3)
Chloride: 109 mmol/L (ref 101–111)
Creatinine, Ser: 1.02 mg/dL — ABNORMAL HIGH (ref 0.44–1.00)
GFR calc Af Amer: >60 mL/min (ref >60)
GFR calc non Af Amer: 53 mL/min — ABNORMAL LOW (ref >60)
Glucose, Bld: 193 mg/dL — ABNORMAL HIGH (ref 65–99)
Potassium: 4.4 mmol/L (ref 3.5–5.1)
Sodium: 139 mmol/L (ref 135–145)

## 2014-10-29 LAB — CBC
HCT: 35.6 % — ABNORMAL LOW (ref 36.0–46.0)
Hemoglobin: 11.4 g/dL — ABNORMAL LOW (ref 12.0–15.0)
MCH: 29.5 pg (ref 26.0–34.0)
MCHC: 32 g/dL (ref 30.0–36.0)
MCV: 92 fL (ref 78.0–100.0)
Platelets: 415 10*3/uL — ABNORMAL HIGH (ref 150–400)
RBC: 3.87 MIL/uL (ref 3.87–5.11)
RDW: 14.2 % (ref 11.5–15.5)
WBC: 10.8 10*3/uL — ABNORMAL HIGH (ref 4.0–10.5)

## 2014-10-29 LAB — GLUCOSE, CAPILLARY: Glucose-Capillary: 165 mg/dL — ABNORMAL HIGH (ref 65–99)

## 2014-10-30 LAB — HEMOGLOBIN A1C
Hgb A1c MFr Bld: 8.9 % — ABNORMAL HIGH (ref 4.8–5.6)
Mean Plasma Glucose: 209 mg/dL

## 2014-10-30 NOTE — Progress Notes (Signed)
Anesthesia Chart Review:  Pt is 74 year old female scheduled for C4-5, C5-6, C6-7 ACDF on 11/07/2014 with Dr. Saintclair Halsted.   Pulmonologist is Dr. Halford Chessman who cleared pt at last office visit 10/17/2014. Cardiologist is Dr. Ron Parker, last office visit 10/09/2014 with Estella Husk, PA who cleared pt for surgery at that visit.   PMH includes: CAD (Mild per remote cath in 2006), carotid artery disease, OSA on CPAP, hyperlipidemia, COPD, hypothyroidism, DM, GERD. Former smoker. BMI 40. S/p L TKA 07/27/11.   Complications of anesthesia are severe nausea and vomiting and pt apparently wakes up from anesthesia "mean".   Medications include: amlodipine, ASA, symbicort, lantus, levothyroxine, metformin, metoprolol, prilosec, zocor, spironolactone, valsartan-hctz, zetia.   Preoperative labs reviewed.  HgbA1c 8.9. Glucose 193.   EKG 10/09/2014: sinus rhythm with first degree AV block  24 hour Holter monitor 08/03/2013: No arrhythmia when pt notes "pulse rate skipping". Two 3 beat runs of consecutive PACs.   Carotid doppler US 07/31/2013:  1. 1-39% RICA stenosis 2. 123456 LICA stenosis 3. Greater than 50% stenosis in the R ECA 4. F/u in 2 years.   Echo 07/31/2013: - Left ventricle: The cavity size was normal. Wall thickness was increased in a pattern of mild LVH. There was mild focal basal hypertrophy of the septum. Systolic function was normal. The estimated ejection fraction was in the range of 55% to 60%. Wall motion was normal; there were no regional wall motion abnormalities. Doppler parameters are consistent with abnormal left ventricular relaxation (grade 1 diastolic dysfunction). - Left atrium: The atrium was mildly dilated. - Atrial septum: There was increased thickness of the septum, consistent with lipomatous hypertrophy.  Nuclear stress test 05/11/2011:  -Low risk stress nuclear study. Mild fixed mid anteroseptal and basal to mid inferior perfusion defects with normal wall motion. Similar pattern on prior  myoview. Suspect this represents attenuation. I do not think that this represents ischemia or infarction.   If no changes, I anticipate pt can proceed with surgery as scheduled.   Willeen Cass, FNP-BC [facility]/Anesthesiology Phone: [phone] 10/30/2014 3:01 PM

## 2014-11-06 NOTE — Progress Notes (Signed)
Notified of new arrival time-1200-verbalized understanding

## 2014-11-07 ENCOUNTER — Inpatient Hospital Stay (HOSPITAL_COMMUNITY)
Admission: RE | Admit: 2014-11-07 | Discharge: 2014-11-08 | DRG: 472 | Disposition: A | Discharge status: Home / Self Care | Payer: Medicare Other | Source: Ambulatory Visit | Attending: Neurosurgery | Admitting: Neurosurgery

## 2014-11-07 ENCOUNTER — Inpatient Hospital Stay (HOSPITAL_COMMUNITY): Payer: Medicare Other | Admitting: Emergency Medicine

## 2014-11-07 ENCOUNTER — Encounter (HOSPITAL_COMMUNITY)
Admission: RE | Disposition: A | Discharge status: Home / Self Care | Payer: Self-pay | Source: Ambulatory Visit | Attending: Neurosurgery

## 2014-11-07 ENCOUNTER — Inpatient Hospital Stay (HOSPITAL_COMMUNITY): Payer: Medicare Other | Admitting: Certified Registered Nurse Anesthetist

## 2014-11-07 ENCOUNTER — Inpatient Hospital Stay (HOSPITAL_COMMUNITY): Payer: Medicare Other

## 2014-11-07 ENCOUNTER — Encounter (HOSPITAL_COMMUNITY): Payer: Self-pay | Admitting: Certified Registered Nurse Anesthetist

## 2014-11-07 DIAGNOSIS — K219 Gastro-esophageal reflux disease without esophagitis: Secondary | ICD-10-CM | POA: Diagnosis present

## 2014-11-07 DIAGNOSIS — G960 Cerebrospinal fluid leak: Secondary | ICD-10-CM | POA: Diagnosis not present

## 2014-11-07 DIAGNOSIS — E785 Hyperlipidemia, unspecified: Secondary | ICD-10-CM | POA: Diagnosis present

## 2014-11-07 DIAGNOSIS — E669 Obesity, unspecified: Secondary | ICD-10-CM | POA: Diagnosis present

## 2014-11-07 DIAGNOSIS — Z96652 Presence of left artificial knee joint: Secondary | ICD-10-CM | POA: Diagnosis present

## 2014-11-07 DIAGNOSIS — J449 Chronic obstructive pulmonary disease, unspecified: Secondary | ICD-10-CM | POA: Diagnosis present

## 2014-11-07 DIAGNOSIS — Z794 Long term (current) use of insulin: Secondary | ICD-10-CM

## 2014-11-07 DIAGNOSIS — Z419 Encounter for procedure for purposes other than remedying health state, unspecified: Secondary | ICD-10-CM

## 2014-11-07 DIAGNOSIS — Z79899 Other long term (current) drug therapy: Secondary | ICD-10-CM | POA: Diagnosis not present

## 2014-11-07 DIAGNOSIS — M5020 Other cervical disc displacement, unspecified cervical region: Secondary | ICD-10-CM | POA: Diagnosis not present

## 2014-11-07 DIAGNOSIS — Z7982 Long term (current) use of aspirin: Secondary | ICD-10-CM

## 2014-11-07 DIAGNOSIS — M47812 Spondylosis without myelopathy or radiculopathy, cervical region: Secondary | ICD-10-CM | POA: Diagnosis not present

## 2014-11-07 DIAGNOSIS — G4733 Obstructive sleep apnea (adult) (pediatric): Secondary | ICD-10-CM | POA: Diagnosis present

## 2014-11-07 DIAGNOSIS — I2510 Atherosclerotic heart disease of native coronary artery without angina pectoris: Secondary | ICD-10-CM | POA: Diagnosis present

## 2014-11-07 DIAGNOSIS — E119 Type 2 diabetes mellitus without complications: Secondary | ICD-10-CM | POA: Diagnosis present

## 2014-11-07 DIAGNOSIS — M4722 Other spondylosis with radiculopathy, cervical region: Secondary | ICD-10-CM | POA: Diagnosis not present

## 2014-11-07 DIAGNOSIS — Z87891 Personal history of nicotine dependence: Secondary | ICD-10-CM

## 2014-11-07 DIAGNOSIS — M4322 Fusion of spine, cervical region: Secondary | ICD-10-CM | POA: Diagnosis not present

## 2014-11-07 DIAGNOSIS — M542 Cervicalgia: Secondary | ICD-10-CM | POA: Diagnosis not present

## 2014-11-07 DIAGNOSIS — M4802 Spinal stenosis, cervical region: Secondary | ICD-10-CM | POA: Diagnosis present

## 2014-11-07 HISTORY — DX: Spinal stenosis, cervical region: M48.02

## 2014-11-07 HISTORY — PX: ANTERIOR CERVICAL DECOMP/DISCECTOMY FUSION: SHX1161

## 2014-11-07 LAB — GLUCOSE, CAPILLARY
Glucose-Capillary: 109 mg/dL — ABNORMAL HIGH (ref 65–99)
Glucose-Capillary: 97 mg/dL (ref 65–99)
Glucose-Capillary: 138 mg/dL — ABNORMAL HIGH (ref 65–99)
Glucose-Capillary: 205 mg/dL — ABNORMAL HIGH (ref 65–99)

## 2014-11-07 SURGERY — ANTERIOR CERVICAL DECOMPRESSION/DISCECTOMY FUSION 3 LEVELS
Anesthesia: General | Site: Neck

## 2014-11-07 MED ORDER — ROCURONIUM BROMIDE 50 MG/5ML IV SOLN
INTRAVENOUS | Status: AC
  Filled 2014-11-07: qty 1

## 2014-11-07 MED ORDER — FENTANYL CITRATE (PF) 100 MCG/2ML IJ SOLN
INTRAMUSCULAR | Status: DC | PRN
  Administered 2014-11-07: 250 ug via INTRAVENOUS
  Administered 2014-11-07 (×2): 100 ug via INTRAVENOUS

## 2014-11-07 MED ORDER — HYDROMORPHONE HCL 1 MG/ML IJ SOLN
0.2500 mg | INTRAMUSCULAR | Status: DC | PRN
  Administered 2014-11-07 (×4): 0.5 mg via INTRAVENOUS

## 2014-11-07 MED ORDER — ONDANSETRON HCL 4 MG/2ML IJ SOLN
INTRAMUSCULAR | Status: DC | PRN
  Administered 2014-11-07: 4 mg via INTRAVENOUS

## 2014-11-07 MED ORDER — PROPOFOL 10 MG/ML IV BOLUS
INTRAVENOUS | Status: AC
Start: 2014-11-07 — End: 2014-11-07
  Filled 2014-11-07: qty 20

## 2014-11-07 MED ORDER — LEVOTHYROXINE SODIUM 200 MCG PO TABS
200.0000 ug | ORAL_TABLET | ORAL | Status: DC
  Filled 2014-11-07: qty 1

## 2014-11-07 MED ORDER — PROMETHAZINE HCL 25 MG RE SUPP
12.5000 mg | Freq: Every day | RECTAL | Status: DC | PRN
Start: 2014-11-07 — End: 2014-11-08

## 2014-11-07 MED ORDER — PHENOL 1.4 % MT LIQD
1.0000 | OROMUCOSAL | Status: DC | PRN
  Filled 2014-11-07: qty 177

## 2014-11-07 MED ORDER — NITROGLYCERIN 0.4 MG SL SUBL: 0.4000 mg | SUBLINGUAL_TABLET | SUBLINGUAL | Status: DC | PRN

## 2014-11-07 MED ORDER — VECURONIUM BROMIDE 10 MG IV SOLR
INTRAVENOUS | Status: DC | PRN
  Administered 2014-11-07: 2 mg via INTRAVENOUS

## 2014-11-07 MED ORDER — METFORMIN HCL 500 MG PO TABS
1000.0000 mg | ORAL_TABLET | Freq: Two times a day (BID) | ORAL | Status: DC
  Administered 2014-11-08: 1000 mg via ORAL
  Filled 2014-11-07 (×3): qty 2

## 2014-11-07 MED ORDER — LORAZEPAM 0.5 MG PO TABS
2.0000 mg | ORAL_TABLET | Freq: Two times a day (BID) | ORAL | Status: DC
  Administered 2014-11-07: 2 mg via ORAL
  Filled 2014-11-07: qty 4

## 2014-11-07 MED ORDER — ONDANSETRON HCL 4 MG/2ML IJ SOLN
INTRAMUSCULAR | Status: AC
  Filled 2014-11-07: qty 2

## 2014-11-07 MED ORDER — TRAMADOL HCL 50 MG PO TABS: 50.0000 mg | ORAL_TABLET | Freq: Two times a day (BID) | ORAL | Status: DC | PRN

## 2014-11-07 MED ORDER — MIDAZOLAM HCL 5 MG/5ML IJ SOLN
INTRAMUSCULAR | Status: DC | PRN
  Administered 2014-11-07: 2 mg via INTRAVENOUS

## 2014-11-07 MED ORDER — HYDROCODONE-ACETAMINOPHEN 5-325 MG PO TABS: 1.0000 | ORAL_TABLET | Freq: Four times a day (QID) | ORAL | Status: DC | PRN

## 2014-11-07 MED ORDER — AMLODIPINE BESYLATE 5 MG PO TABS
5.0000 mg | ORAL_TABLET | Freq: Every day | ORAL | Status: DC
  Filled 2014-11-07: qty 1

## 2014-11-07 MED ORDER — SODIUM CHLORIDE 0.9 % IJ SOLN: 3.0000 mL | INTRAMUSCULAR | Status: DC | PRN

## 2014-11-07 MED ORDER — LIDOCAINE HCL (CARDIAC) 20 MG/ML IV SOLN
INTRAVENOUS | Status: AC
  Filled 2014-11-07: qty 5

## 2014-11-07 MED ORDER — PROPOFOL 10 MG/ML IV BOLUS
INTRAVENOUS | Status: DC | PRN
  Administered 2014-11-07: 150 mg via INTRAVENOUS

## 2014-11-07 MED ORDER — FLUTICASONE PROPIONATE 50 MCG/ACT NA SUSP
1.0000 | Freq: Every day | NASAL | Status: DC
  Administered 2014-11-07: 1 via NASAL
  Filled 2014-11-07: qty 16

## 2014-11-07 MED ORDER — SCOPOLAMINE 1 MG/3DAYS TD PT72: 1.0000 | MEDICATED_PATCH | TRANSDERMAL | Status: DC

## 2014-11-07 MED ORDER — ASPIRIN EC 81 MG PO TBEC
81.0000 mg | DELAYED_RELEASE_TABLET | Freq: Every day | ORAL | Status: DC
Start: 2014-11-07 — End: 2014-11-08
  Filled 2014-11-07 (×2): qty 1

## 2014-11-07 MED ORDER — HYDROCHLOROTHIAZIDE 25 MG PO TABS
25.0000 mg | ORAL_TABLET | Freq: Every day | ORAL | Status: DC
  Administered 2014-11-07: 25 mg via ORAL
  Filled 2014-11-07 (×2): qty 1

## 2014-11-07 MED ORDER — ARTIFICIAL TEARS OP OINT
TOPICAL_OINTMENT | OPHTHALMIC | Status: DC | PRN
  Administered 2014-11-07: 1 via OPHTHALMIC

## 2014-11-07 MED ORDER — BUDESONIDE-FORMOTEROL FUMARATE 160-4.5 MCG/ACT IN AERO
2.0000 | INHALATION_SPRAY | Freq: Two times a day (BID) | RESPIRATORY_TRACT | Status: DC
  Filled 2014-11-07: qty 6

## 2014-11-07 MED ORDER — MECLIZINE HCL 25 MG PO TABS
25.0000 mg | ORAL_TABLET | Freq: Two times a day (BID) | ORAL | Status: DC | PRN
  Filled 2014-11-07: qty 1

## 2014-11-07 MED ORDER — DIPHENHYDRAMINE HCL 50 MG/ML IJ SOLN: 10.0000 mg | Freq: Once | INTRAMUSCULAR | Status: DC

## 2014-11-07 MED ORDER — MIDAZOLAM HCL 2 MG/2ML IJ SOLN
INTRAMUSCULAR | Status: AC
  Filled 2014-11-07: qty 2

## 2014-11-07 MED ORDER — CYCLOBENZAPRINE HCL 10 MG PO TABS
10.0000 mg | ORAL_TABLET | Freq: Three times a day (TID) | ORAL | Status: DC | PRN
  Administered 2014-11-07: 10 mg via ORAL
  Filled 2014-11-07 (×2): qty 1

## 2014-11-07 MED ORDER — GLYCOPYRROLATE 0.2 MG/ML IJ SOLN
INTRAMUSCULAR | Status: DC | PRN
  Administered 2014-11-07: .4 mg via INTRAVENOUS

## 2014-11-07 MED ORDER — ALBUTEROL SULFATE (2.5 MG/3ML) 0.083% IN NEBU: 2.5000 mg | INHALATION_SOLUTION | Freq: Four times a day (QID) | RESPIRATORY_TRACT | Status: DC | PRN

## 2014-11-07 MED ORDER — SPIRONOLACTONE 50 MG PO TABS
50.0000 mg | ORAL_TABLET | Freq: Every day | ORAL | Status: DC | PRN
  Filled 2014-11-07: qty 1

## 2014-11-07 MED ORDER — ACETAMINOPHEN 325 MG PO TABS: 650.0000 mg | ORAL_TABLET | ORAL | Status: DC | PRN

## 2014-11-07 MED ORDER — FENOFIBRATE 160 MG PO TABS
160.0000 mg | ORAL_TABLET | Freq: Every day | ORAL | Status: DC
  Administered 2014-11-07: 160 mg via ORAL
  Filled 2014-11-07 (×2): qty 1

## 2014-11-07 MED ORDER — 0.9 % SODIUM CHLORIDE (POUR BTL) OPTIME
TOPICAL | Status: DC | PRN
  Administered 2014-11-07: 1000 mL

## 2014-11-07 MED ORDER — MENTHOL 3 MG MT LOZG
1.0000 | LOZENGE | OROMUCOSAL | Status: DC | PRN
Start: 2014-11-07 — End: 2014-11-08

## 2014-11-07 MED ORDER — ALBUTEROL SULFATE HFA 108 (90 BASE) MCG/ACT IN AERS: 2.0000 | INHALATION_SPRAY | Freq: Four times a day (QID) | RESPIRATORY_TRACT | Status: DC | PRN

## 2014-11-07 MED ORDER — DEXAMETHASONE SODIUM PHOSPHATE 10 MG/ML IJ SOLN
INTRAMUSCULAR | Status: DC | PRN
  Administered 2014-11-07: 10 mg via INTRAVENOUS

## 2014-11-07 MED ORDER — SODIUM CHLORIDE 0.9 % IR SOLN
Status: DC | PRN
  Administered 2014-11-07: 500 mL

## 2014-11-07 MED ORDER — SODIUM CHLORIDE 0.9 % IJ SOLN
3.0000 mL | Freq: Two times a day (BID) | INTRAMUSCULAR | Status: DC
  Administered 2014-11-07: 3 mL via INTRAVENOUS

## 2014-11-07 MED ORDER — CYCLOBENZAPRINE HCL 10 MG PO TABS
ORAL_TABLET | ORAL | Status: AC
  Administered 2014-11-07: 10 mg via ORAL
  Filled 2014-11-07: qty 1

## 2014-11-07 MED ORDER — VALSARTAN-HYDROCHLOROTHIAZIDE 320-25 MG PO TABS: 1.0000 | ORAL_TABLET | Freq: Every day | ORAL | Status: DC

## 2014-11-07 MED ORDER — ACETAMINOPHEN 650 MG RE SUPP: 650.0000 mg | RECTAL | Status: DC | PRN

## 2014-11-07 MED ORDER — MIDAZOLAM HCL 2 MG/2ML IJ SOLN
0.5000 mg | Freq: Once | INTRAMUSCULAR | Status: AC | PRN
  Administered 2014-11-07: 0.5 mg via INTRAVENOUS

## 2014-11-07 MED ORDER — HYDROMORPHONE HCL 1 MG/ML IJ SOLN
INTRAMUSCULAR | Status: AC
  Administered 2014-11-07: 0.5 mg via INTRAVENOUS
  Filled 2014-11-07: qty 1

## 2014-11-07 MED ORDER — EZETIMIBE 10 MG PO TABS
10.0000 mg | ORAL_TABLET | Freq: Every day | ORAL | Status: DC
Start: 2014-11-07 — End: 2014-11-08
  Administered 2014-11-07: 10 mg via ORAL
  Filled 2014-11-07 (×2): qty 1

## 2014-11-07 MED ORDER — LEVOTHYROXINE SODIUM 175 MCG PO TABS
175.0000 ug | ORAL_TABLET | ORAL | Status: DC
  Administered 2014-11-08: 175 ug via ORAL
  Filled 2014-11-07: qty 1

## 2014-11-07 MED ORDER — METOPROLOL TARTRATE 25 MG PO TABS
25.0000 mg | ORAL_TABLET | Freq: Two times a day (BID) | ORAL | Status: DC
  Administered 2014-11-07: 25 mg via ORAL
  Filled 2014-11-07 (×3): qty 1

## 2014-11-07 MED ORDER — SIMVASTATIN 20 MG PO TABS
20.0000 mg | ORAL_TABLET | Freq: Every day | ORAL | Status: DC
  Filled 2014-11-07: qty 1

## 2014-11-07 MED ORDER — THROMBIN 5000 UNITS EX SOLR
OROMUCOSAL | Status: DC | PRN
  Administered 2014-11-07 (×2): 10 mL via TOPICAL

## 2014-11-07 MED ORDER — FENTANYL CITRATE (PF) 250 MCG/5ML IJ SOLN
INTRAMUSCULAR | Status: AC
  Filled 2014-11-07: qty 5

## 2014-11-07 MED ORDER — SODIUM CHLORIDE 0.9 % IV SOLN: 250.0000 mL | INTRAVENOUS | Status: DC

## 2014-11-07 MED ORDER — LIDOCAINE HCL (CARDIAC) 20 MG/ML IV SOLN
INTRAVENOUS | Status: DC | PRN
  Administered 2014-11-07: 20 mg via INTRAVENOUS

## 2014-11-07 MED ORDER — VALSARTAN 160 MG PO TABS
320.0000 mg | ORAL_TABLET | Freq: Every day | ORAL | Status: DC
  Administered 2014-11-07: 320 mg via ORAL
  Filled 2014-11-07 (×2): qty 2

## 2014-11-07 MED ORDER — GABAPENTIN 600 MG PO TABS
600.0000 mg | ORAL_TABLET | Freq: Three times a day (TID) | ORAL | Status: DC
  Administered 2014-11-07: 600 mg via ORAL
  Filled 2014-11-07 (×4): qty 1

## 2014-11-07 MED ORDER — MEPERIDINE HCL 25 MG/ML IJ SOLN: 6.2500 mg | INTRAMUSCULAR | Status: DC | PRN

## 2014-11-07 MED ORDER — OXYCODONE-ACETAMINOPHEN 5-325 MG PO TABS
ORAL_TABLET | ORAL | Status: AC
  Administered 2014-11-07: 2 via ORAL
  Filled 2014-11-07: qty 2

## 2014-11-07 MED ORDER — EPHEDRINE SULFATE 50 MG/ML IJ SOLN
INTRAMUSCULAR | Status: DC | PRN
  Administered 2014-11-07: 5 mg via INTRAVENOUS
  Administered 2014-11-07 (×2): 10 mg via INTRAVENOUS

## 2014-11-07 MED ORDER — ROCURONIUM BROMIDE 100 MG/10ML IV SOLN
INTRAVENOUS | Status: DC | PRN
  Administered 2014-11-07: 50 mg via INTRAVENOUS

## 2014-11-07 MED ORDER — SCOPOLAMINE 1 MG/3DAYS TD PT72
MEDICATED_PATCH | TRANSDERMAL | Status: AC
  Administered 2014-11-07: 13:00:00
  Filled 2014-11-07: qty 1

## 2014-11-07 MED ORDER — HYDROMORPHONE HCL 1 MG/ML IJ SOLN: 0.5000 mg | INTRAMUSCULAR | Status: DC | PRN

## 2014-11-07 MED ORDER — CEFAZOLIN SODIUM-DEXTROSE 2-3 GM-% IV SOLR
2.0000 g | Freq: Once | INTRAVENOUS | Status: AC
  Administered 2014-11-07: 2 g via INTRAVENOUS
  Filled 2014-11-07: qty 50

## 2014-11-07 MED ORDER — ONDANSETRON HCL 4 MG/2ML IJ SOLN: 4.0000 mg | INTRAMUSCULAR | Status: DC | PRN

## 2014-11-07 MED ORDER — THROMBIN 20000 UNITS EX SOLR
CUTANEOUS | Status: DC | PRN
  Administered 2014-11-07: 20 mL via TOPICAL

## 2014-11-07 MED ORDER — PROMETHAZINE HCL 25 MG/ML IJ SOLN: 6.2500 mg | INTRAMUSCULAR | Status: DC | PRN

## 2014-11-07 MED ORDER — PANTOPRAZOLE SODIUM 40 MG PO TBEC: 40.0000 mg | DELAYED_RELEASE_TABLET | Freq: Every day | ORAL | Status: DC

## 2014-11-07 MED ORDER — HEMOSTATIC AGENTS (NO CHARGE) OPTIME
TOPICAL | Status: DC | PRN
  Administered 2014-11-07: 1 via TOPICAL

## 2014-11-07 MED ORDER — NEOSTIGMINE METHYLSULFATE 10 MG/10ML IV SOLN
INTRAVENOUS | Status: DC | PRN
  Administered 2014-11-07: 3 mg via INTRAVENOUS

## 2014-11-07 MED ORDER — LACTATED RINGERS IV SOLN
INTRAVENOUS | Status: DC | PRN
  Administered 2014-11-07 (×2): via INTRAVENOUS

## 2014-11-07 MED ORDER — OXYCODONE-ACETAMINOPHEN 5-325 MG PO TABS
1.0000 | ORAL_TABLET | ORAL | Status: DC | PRN
  Administered 2014-11-07 – 2014-11-08 (×4): 2 via ORAL
  Filled 2014-11-07 (×3): qty 2

## 2014-11-07 MED ORDER — CEFAZOLIN SODIUM 1-5 GM-% IV SOLN
1.0000 g | Freq: Three times a day (TID) | INTRAVENOUS | Status: AC
  Administered 2014-11-07 – 2014-11-08 (×2): 1 g via INTRAVENOUS
  Filled 2014-11-07 (×2): qty 50

## 2014-11-07 MED ORDER — INSULIN GLARGINE 100 UNIT/ML ~~LOC~~ SOLN
100.0000 [IU] | Freq: Two times a day (BID) | SUBCUTANEOUS | Status: DC
  Administered 2014-11-07: 100 [IU] via SUBCUTANEOUS
  Filled 2014-11-07 (×3): qty 1

## 2014-11-07 SURGICAL SUPPLY — 61 items
BAG DECANTER FOR FLEXI CONT (MISCELLANEOUS) ×2 IMPLANT
BENZOIN TINCTURE PRP APPL 2/3 (GAUZE/BANDAGES/DRESSINGS) ×2 IMPLANT
BIT DRILL 13 (BIT) ×2 IMPLANT
BRUSH SCRUB EZ PLAIN DRY (MISCELLANEOUS) ×2 IMPLANT
BUR MATCHSTICK NEURO 3.0 LAGG (BURR) ×2 IMPLANT
CANISTER SUCT 3000ML PPV (MISCELLANEOUS) ×2 IMPLANT
CONT SPEC 4OZ CLIKSEAL STRL BL (MISCELLANEOUS) ×2 IMPLANT
DECANTER SPIKE VIAL GLASS SM (MISCELLANEOUS) ×2 IMPLANT
DERMABOND ADHESIVE PROPEN (GAUZE/BANDAGES/DRESSINGS) ×1
DERMABOND ADVANCED .7 DNX6 (GAUZE/BANDAGES/DRESSINGS) ×1 IMPLANT
DRAIN CHANNEL 7F 3/4 FLAT (WOUND CARE) ×2 IMPLANT
DRAPE C-ARM 42X72 X-RAY (DRAPES) ×4 IMPLANT
DRAPE LAPAROTOMY 100X72 PEDS (DRAPES) ×2 IMPLANT
DRAPE MICROSCOPE LEICA (MISCELLANEOUS) ×2 IMPLANT
DRAPE POUCH INSTRU U-SHP 10X18 (DRAPES) ×2 IMPLANT
DRSG OPSITE POSTOP 4X6 (GAUZE/BANDAGES/DRESSINGS) ×2 IMPLANT
DURAPREP 6ML APPLICATOR 50/CS (WOUND CARE) ×2 IMPLANT
DURASEAL APPLICATOR TIP (TIP) ×2 IMPLANT
DURASEAL SPINE SEALANT 3ML (MISCELLANEOUS) ×2 IMPLANT
ELECT COATED BLADE 2.86 ST (ELECTRODE) ×2 IMPLANT
ELECT REM PT RETURN 9FT ADLT (ELECTROSURGICAL) ×2
ELECTRODE REM PT RTRN 9FT ADLT (ELECTROSURGICAL) ×1 IMPLANT
EVACUATOR SILICONE 100CC (DRAIN) ×2 IMPLANT
GAUZE SPONGE 4X4 12PLY STRL (GAUZE/BANDAGES/DRESSINGS) ×2 IMPLANT
GAUZE SPONGE 4X4 16PLY XRAY LF (GAUZE/BANDAGES/DRESSINGS) IMPLANT
GLOVE BIO SURGEON STRL SZ8 (GLOVE) ×2 IMPLANT
GLOVE EXAM NITRILE LRG STRL (GLOVE) IMPLANT
GLOVE EXAM NITRILE MD LF STRL (GLOVE) IMPLANT
GLOVE EXAM NITRILE XL STR (GLOVE) IMPLANT
GLOVE EXAM NITRILE XS STR PU (GLOVE) IMPLANT
GLOVE INDICATOR 8.5 STRL (GLOVE) ×2 IMPLANT
GOWN STRL REUS W/ TWL LRG LVL3 (GOWN DISPOSABLE) IMPLANT
GOWN STRL REUS W/ TWL XL LVL3 (GOWN DISPOSABLE) ×1 IMPLANT
GOWN STRL REUS W/TWL 2XL LVL3 (GOWN DISPOSABLE) IMPLANT
GOWN STRL REUS W/TWL LRG LVL3 (GOWN DISPOSABLE)
GOWN STRL REUS W/TWL XL LVL3 (GOWN DISPOSABLE) ×1
HALTER HD/CHIN CERV TRACTION D (MISCELLANEOUS) ×2 IMPLANT
HEMOSTAT POWDER KIT SURGIFOAM (HEMOSTASIS) ×2 IMPLANT
KIT BASIN OR (CUSTOM PROCEDURE TRAY) ×2 IMPLANT
KIT ROOM TURNOVER OR (KITS) ×2 IMPLANT
LIQUID BAND (GAUZE/BANDAGES/DRESSINGS) ×2 IMPLANT
NEEDLE SPNL 20GX3.5 QUINCKE YW (NEEDLE) ×2 IMPLANT
NS IRRIG 1000ML POUR BTL (IV SOLUTION) ×2 IMPLANT
PACK LAMINECTOMY NEURO (CUSTOM PROCEDURE TRAY) ×2 IMPLANT
PIN DISTRACTION 14MM (PIN) ×4 IMPLANT
PLATE 3 57.5XLCK NS SPNE CVD (Plate) ×1 IMPLANT
PLATE 3 ATLANTIS TRANS (Plate) ×1 IMPLANT
RUBBERBAND STERILE (MISCELLANEOUS) ×4 IMPLANT
SCREW ST FIX 4 ATL 3120213 (Screw) ×16 IMPLANT
SPACER CERVICAL FRGE 12X14X8-7 (Spacer) ×6 IMPLANT
SPONGE INTESTINAL PEANUT (DISPOSABLE) ×2 IMPLANT
SPONGE SURGIFOAM ABS GEL 100 (HEMOSTASIS) ×2 IMPLANT
STRIP CLOSURE SKIN 1/2X4 (GAUZE/BANDAGES/DRESSINGS) ×2 IMPLANT
SUT VIC AB 3-0 SH 8-18 (SUTURE) ×2 IMPLANT
SUT VICRYL 4-0 PS2 18IN ABS (SUTURE) ×2 IMPLANT
SYR 20ML ECCENTRIC (SYRINGE) ×2 IMPLANT
TAPE CLOTH 4X10 WHT NS (GAUZE/BANDAGES/DRESSINGS) IMPLANT
TOWEL OR 17X24 6PK STRL BLUE (TOWEL DISPOSABLE) ×2 IMPLANT
TOWEL OR 17X26 10 PK STRL BLUE (TOWEL DISPOSABLE) ×2 IMPLANT
TRAP SPECIMEN MUCOUS 40CC (MISCELLANEOUS) ×2 IMPLANT
WATER STERILE IRR 1000ML POUR (IV SOLUTION) ×2 IMPLANT

## 2014-11-07 NOTE — Progress Notes (Signed)
Orthopedic Tech Progress Note Patient Details:  Kayla [name] [birth year] [id number] Patient already has c-collar. Patient ID: Kayla [name], female   DOB: 1940-07-25, 74 y.o.   MRN: [id number]   Braulio Bosch 11/07/2014, 9:22 PM

## 2014-11-07 NOTE — Op Note (Signed)
Preoperative diagnosis: Cervical spondylosis with stenosis C4-5, C5-6, C6-7 with left-sided C6-C7 radiculopathies  Postoperative diagnosis: Same  Procedure: Anterior cervical discectomies and fusion at C4-5, C5-6, C6-7 utilizing allograft wedges and the Atlantis translational plating system with 8 fixed angle 13 mm screws  Surgeon: Dominica Severin Richele Strand  Asst.: Sherley Bounds  Anesthesia: Gen.  EBL: Minimal  History of present illness: Patient is a 74 year old same as a progress worsening neck and predominantly left greater right shoulder and arm pain rating to her tricep and the forearm into her thumb and first 2 fingers workup revealed severe spinal stenosis and spondylosis at C4-5, C5-6, C6-7 and due to patient's failure of conservative treatment imaging findings and progression of clinical syndrome I recommended to her cervical discectomies and fusion at those levels. I extensively reviewed the risks and benefits of the operation with the patient as well as perioperative course expectations of outcome and alternatives of surgery and she understood and agreed to proceed forward.  Operative procedure: Patient was brought into the or and induced under general anesthesia positioned supine the neck in slight extension in 5 pounds of halter traction the right 7 neck is prepped and draped in routine sterile fashion preoperative x-ray localize the appropriate level so after localization a curvilinear incision was made just off midline to the interbody of the sternocleidomastoid and the superficial layer of the platysmas dissected and divided longitudinally there was extensive amount of scar tissue from previous thyroidectomy this was all dissected free there was a large external jugular vein that I dissected and reflected laterally and working in the avascular plane identify the carotid artery and exposed the prevertebral fascia. The prevertebral fascia dissected with Kitners. The interoperative x-ray confirmed  initially the 34 disc space so I dissected 3 disc spaces below this and incised with a 15 blade scalpel to marked them. Longus close reflected laterally and self-retaining retractor was placed. First working at C6-7 large anterior aspect of did not both 5 6-6/7 in the disc space is drilled down with a high-speed drill under [employer] illumination aggressive abutting both endplates allowed identification of the PLL which was removed in piecemeal fashion. There was marked compression of thecal sac on the right side from a large spur coming off both the C5-6 and C7 vertebral bodies. This was aggressively under been decompress and central canal marking laterally both C7 pedicles were identified both C7 nerve roots were skeletonized flush with pedicle this was then copiously irrigated and a size 8 mm allograft wedge was selected sized inserted. Then working at C5-6 in a similar fashion C5-6 was decompressed aggressive and viable template working up underneath the posterior longitudinal ligament I saw quick jets of CSF into locations one was over the axilla of the C6 nerve root on the right the second was just off the midline on the left. So because of the dense adhesion of the PLL and partial ossification of the PLL left a piece of it intact for decompressed around it under biting both endplates decompress the central canal. Both C6 pedicles were identified both C6 nerve roots were decompressed. Then an 8 mm allograft was inserted here after I placed some DuraSeal over this sites of the CSF leak there is no CSF coming out of the sites after packing and prior to DuraSeal placement. Then I escorted Surgifoam along side of each allograft. Then working at C4-5 pathology here was mostly soft disc this is all removed decompress the central canal the aggressive undergoing of scraping of both  endplates D allowed placement of another 8 mm graft then after all grafts were in good position a 57.5 Atlantis translational plate was  placed all screws excellent purchase locking mechanisms were engaged wounds and to proceed her get meticulous hemostasis was maintained there was no CSF visualized because it was a three-level dissection the area was she had previous surgery I did elect to leave a drain. And I closed the platysma with interrupted Vicryls and a running 4 subcuticular in the skin benzoin Steri-Strips and Steri-Strips are applied patient recovered in stable condition. At the end of case all needle counts sponge counts were correct.

## 2014-11-07 NOTE — Anesthesia Postprocedure Evaluation (Signed)
  Anesthesia Post-op Note  Patient: Kayla [name]  Procedure(s) Performed: Procedure(s): Anterior Cervical diskectomy and fusion Cervical four-five, Cervical five-six, Cervical six-seven (N/A)  Patient Location: PACU  Anesthesia Type:General  Level of Consciousness: awake, alert , oriented and patient cooperative  Airway and Oxygen Therapy: Patient Spontanous Breathing and Patient connected to nasal cannula oxygen  Post-op Pain: mild  Post-op Assessment: Post-op Vital signs reviewed, Patient's Cardiovascular Status Stable, Respiratory Function Stable, Patent Airway, No signs of Nausea or vomiting and Pain level controlled              Post-op Vital Signs: Reviewed and stable  Last Vitals:  Filed Vitals:   11/07/14 1803  BP:   Pulse: 104  Temp:   Resp: 24    Complications: No apparent anesthesia complications

## 2014-11-07 NOTE — H&P (Signed)
Kayla [name] is an 74 y.o. female.   Chief Complaint: Neck pain and left arm pain HPI: Patient is a very pleasant 74 year old female is a progress worsening neck and left arm pain going on for the last several months and years. It radiates predominantly to the left shoulder and her tricep and down the first 3 fingers of her left hand. Workup has revealed severe cervical spinal stenosis and spondylosis from C4-C7. Due to her failure conservative treatment imaging findings and progression of clinical syndrome I recommended an ACDF at C4-5, C5-6, and C6-7. I extensively went over the risks and benefits of the operation the patient as well as perioperative course expectations of outcome and alternatives of surgery and she understands and agrees to proceed forward.  Past Medical History  Diagnosis Date  . GERD (gastroesophageal reflux disease)   . Diverticula of colon   . CAD (coronary artery disease)     Mild per remote cath in 2006, /    Nuclear, January, 2013, no significant abnormality,  . Hyperlipidemia   . Pulmonary hypertension, secondary   . Leg edema     occasional swelling  . Goiter     hx of  . Obesity   . Diabetes mellitus, type 2   . Hypothyroidism   . Left ventricular hypertrophy   . Carotid artery disease     Doppler, January, 2013, 0 [id number] RIC A., 123456 LICA, stable  . Dizziness     occasional  . COPD (chronic obstructive pulmonary disease)   . Headache(784.0)     migraine  . Arthritis   . Complication of anesthesia     wakes up "mean"  . OSA (obstructive sleep apnea)     cpap setting of 12  . PONV (postoperative nausea and vomiting)     severe after every surgery    Past Surgical History  Procedure Laterality Date  . Thyroidectomy  2011  . Back surgery  1990's    lower back  . Total knee arthroplasty  07/27/2011    Procedure: TOTAL KNEE ARTHROPLASTY;  Surgeon: Mauri Pole, MD;  Location: [facility] ORS;  Service: Orthopedics;  Laterality: Left;  . Abdominal  hysterectomy      Family History  Problem Relation Age of Onset  . Heart Problems Mother   . Heart Problems Father    Social History:  reports that she quit smoking about 17 years ago. Her smoking use included Cigarettes. She has a 51 pack-year smoking history. She has never used smokeless tobacco. She reports that she does not drink alcohol or use illicit drugs.  Allergies: No Known Allergies  Medications Prior to Admission  Medication Sig Dispense Refill  . albuterol (PROAIR HFA) 108 (90 BASE) MCG/ACT inhaler Inhale 2 puffs into the lungs every 6 (six) hours as needed for wheezing or shortness of breath. 3 Inhaler 3  . amLODipine (NORVASC) 5 MG tablet Take 5 mg by mouth daily. Once a day    . aspirin 81 MG tablet Take 81 mg by mouth daily.    . budesonide-formoterol (SYMBICORT) 160-4.5 MCG/ACT inhaler Inhale 2 puffs into the lungs 2 (two) times daily. 3 Inhaler 3  . Cholecalciferol (D3-1000 PO) Take 1 tablet by mouth daily.    . Cyanocobalamin (B-12 PO) Take 1 tablet by mouth daily.    . fenofibrate 160 MG tablet Take 160 mg by mouth daily.    . fluticasone (FLONASE) 50 MCG/ACT nasal spray USE 2 SPRAYS NASALLY DAILY 48 g 0  .  gabapentin (NEURONTIN) 600 MG tablet Take 600 mg by mouth 3 (three) times daily.     Marland Kitchen HYDROcodone-acetaminophen (NORCO/VICODIN) 5-325 MG per tablet Take 1 tablet by mouth every 6 (six) hours as needed for moderate pain.     Marland Kitchen insulin glargine (LANTUS) 100 UNIT/ML injection Inject 100 Units into the skin 2 (two) times daily.     Marland Kitchen levothyroxine (SYNTHROID, LEVOTHROID) 175 MCG tablet Take 175 mcg by mouth See admin instructions. Alternates with 200 mcg every other day    . levothyroxine (SYNTHROID, LEVOTHROID) 200 MCG tablet Take 200 mcg by mouth See admin instructions. Alternates with 175 mcg every other day    . LORazepam (ATIVAN) 2 MG tablet Take 2 mg by mouth daily as needed. FOR DEPRESSION    . metFORMIN (GLUCOPHAGE) 500 MG tablet Take 1,000 mg by mouth 2 (two)  times daily with a meal.    . metoprolol tartrate (LOPRESSOR) 25 MG tablet Take 25 mg by mouth 2 (two) times daily.    . simvastatin (ZOCOR) 20 MG tablet Take 1 tablet by mouth daily.    Marland Kitchen spironolactone (ALDACTONE) 50 MG tablet Take 50 mg by mouth as needed (swelling). As needed    . valsartan-hydrochlorothiazide (DIOVAN-HCT) 320-25 MG per tablet Take 1 tablet by mouth daily.    Marland Kitchen ZETIA 10 MG tablet Take 10 mg by mouth daily.     . meclizine (ANTIVERT) 25 MG tablet Take 25 mg by mouth 2 (two) times daily as needed for dizziness.     Mable Fill (EYE ALLERGY RELIEF OP) Apply 1-2 drops to eye 2 (two) times daily as needed (allergy eyes).     . nitroGLYCERIN (NITROSTAT) 0.4 MG SL tablet Place 1 tablet (0.4 mg total) under the tongue every 5 (five) minutes as needed for chest pain. 25 tablet 3  . omeprazole (PRILOSEC) 20 MG capsule Take 20 mg by mouth daily.    Marland Kitchen PHENADOZ 25 MG suppository Place 2.5 mg rectally daily as needed. (FOR HEADACHES)    . traMADol (ULTRAM) 50 MG tablet Take 50 mg by mouth every 12 (twelve) hours as needed for moderate pain.      Results for orders placed or performed during the hospital encounter of 11/07/14 (from the past 48 hour(s))  Glucose, capillary     Status: Abnormal   Collection Time: 11/07/14 11:52 AM  Result Value Ref Range   Glucose-Capillary 109 (H) 65 - 99 mg/dL  Glucose, capillary     Status: None   Collection Time: 11/07/14  1:11 PM  Result Value Ref Range   Glucose-Capillary 97 65 - 99 mg/dL   Comment 1 Notify RN    Comment 2 Document in Chart    No results found.  Review of Systems  Constitutional: Negative.   Eyes: Negative.   Respiratory: Negative.   Cardiovascular: Negative.   Gastrointestinal: Negative.   Genitourinary: Negative.   Musculoskeletal: Positive for myalgias, joint pain and neck pain.  Skin: Negative.   Neurological: Positive for tingling and sensory change.    Blood pressure 160/51, pulse 67, temperature  97.8 F (36.6 C), temperature source Oral, resp. rate 20, weight 108.863 kg (240 lb), SpO2 96 %. Physical Exam  Constitutional: She is oriented to person, place, and time. She appears well-developed.  HENT:  Head: Normocephalic.  Eyes: Pupils are equal, round, and reactive to light.  Neck: Normal range of motion.  Cardiovascular: Normal rate.   Respiratory: Effort normal.  GI: Soft.  Musculoskeletal: Normal range of motion.  Neurological: She is alert and oriented to person, place, and time. She has normal strength. GCS eye subscore is 4. GCS verbal subscore is 5. GCS motor subscore is 6.  Strength is 5 out of 5 in her deltoid, bicep, tricep, wrist flexion, wrist extension, hand intrinsics.     Assessment/Plan 74 year old female presents for ACDF C4-5, C5-6, C6-7.  Dayquan Buys P 11/07/2014, 1:40 PM

## 2014-11-07 NOTE — Transfer of Care (Signed)
Immediate Anesthesia Transfer of Care Note  Patient: Kayla [name]  Procedure(s) Performed: Procedure(s): Anterior Cervical diskectomy and fusion Cervical four-five, Cervical five-six, Cervical six-seven (N/A)  Patient Location: PACU  Anesthesia Type:General  Level of Consciousness: awake and confused  Airway & Oxygen Therapy: Patient Spontanous Breathing and Patient connected to face mask oxygen  Post-op Assessment: Report given to RN, Post -op Vital signs reviewed and stable and Patient moving all extremities X 4  Post vital signs: Reviewed and stable  Last Vitals:  Filed Vitals:   11/07/14 1158  BP: 160/51  Pulse: 67  Temp: 36.6 C  Resp: 20    Complications: No apparent anesthesia complications

## 2014-11-07 NOTE — Anesthesia Procedure Notes (Signed)
Procedure Name: Intubation Performed by: Clearnce Sorrel Patient Re-evaluated:Patient Re-evaluated prior to inductionOxygen Delivery Method: Circle system utilized Preoxygenation: Pre-oxygenation with 100% oxygen Intubation Type: IV induction Ventilation: Oral airway inserted - appropriate to patient size and Two handed mask ventilation required Laryngoscope Size: Miller and 2 Grade View: Grade I Tube type: Oral Tube size: 7.5 mm Number of attempts: 1 Placement Confirmation: ETT inserted through vocal cords under direct vision,  breath sounds checked- equal and bilateral and positive ETCO2 Tube secured with: Tape Dental Injury: Teeth and Oropharynx as per pre-operative assessment

## 2014-11-07 NOTE — Anesthesia Preprocedure Evaluation (Addendum)
Anesthesia Evaluation  Patient identified by MRN, date of birth, ID band Patient awake    Reviewed: Allergy & Precautions, NPO status , Patient's Chart, lab work & pertinent test results  History of Anesthesia Complications (+) PONV and history of anesthetic complications  Airway Mallampati: II  TM Distance: >3 FB Neck ROM: Full    Dental  (+) Edentulous Upper, Partial Lower, Dental Advisory Given, Missing   Pulmonary sleep apnea and Continuous Positive Airway Pressure Ventilation , COPD COPD inhaler, former smoker (quit 1999),  breath sounds clear to auscultation        Cardiovascular hypertension, + angina ("cleared" by cardiology) with exertion + CAD (mild, non-obstructive by cath) and + Peripheral Vascular Disease Rhythm:Regular Rate:Normal  '13 stress: no ischemia '15 ECHO: normal LV function with mild LVH, EF 55-60%    Neuro/Psych  Headaches,    GI/Hepatic Neg liver ROS, GERD-  Medicated and Controlled,  Endo/Other  diabetes (glu 109), Insulin Dependent, Oral Hypoglycemic AgentsHypothyroidism   Renal/GU negative Renal ROS     Musculoskeletal  (+) Arthritis -,   Abdominal (+) + obese,   Peds  Hematology  (+) Blood dyscrasia (Hb 11.4), ,   Anesthesia Other Findings   Reproductive/Obstetrics                           Anesthesia Physical Anesthesia Plan  ASA: III  Anesthesia Plan: General   Post-op Pain Management:    Induction: Intravenous  Airway Management Planned: Oral ETT  Additional Equipment:   Intra-op Plan:   Post-operative Plan: Extubation in OR  Informed Consent: I have reviewed the patients History and Physical, chart, labs and discussed the procedure including the risks, benefits and alternatives for the proposed anesthesia with the patient or authorized representative who has indicated his/her understanding and acceptance.   Dental advisory given  Plan Discussed  with: CRNA, Surgeon and Anesthesiologist  Anesthesia Plan Comments: (Plan routine monitors, GETA)       Anesthesia Quick Evaluation

## 2014-11-07 NOTE — Progress Notes (Signed)
Spoke with pt and husband about her wearing a CPAP unit. Pt states that she does usually wear one at home, but did not want to wear one at the time I arrived. Let pt and husband know to have RN call me when she was ready to go to sleep. Left unit/mask/circuit in pt's room.

## 2014-11-08 ENCOUNTER — Encounter (HOSPITAL_COMMUNITY): Payer: Self-pay | Admitting: Neurosurgery

## 2014-11-08 LAB — GLUCOSE, CAPILLARY: Glucose-Capillary: 237 mg/dL — ABNORMAL HIGH (ref 65–99)

## 2014-11-08 MED ORDER — OXYCODONE-ACETAMINOPHEN 5-325 MG PO TABS: 1.0000 | ORAL_TABLET | ORAL | Status: DC | PRN

## 2014-11-08 NOTE — Discharge Instructions (Signed)
No lifting no bending no twisting no driving a riding a car unless she is coming back and forth to see me. Keep incision clean dry and intact.   Wound Care Keep incision covered and dry for one week.  If you shower prior to then, cover incision with plastic wrap.  You may remove outer bandage after one week and shower.  Do not put any creams, lotions, or ointments on incision. Leave steri-strips on neck.  They will fall off by themselves. Activity Walk each and every day, increasing distance each day. No lifting greater than 5 lbs.  Avoid excessive neck motion. No driving for 2 weeks; may ride as a passenger locally. Wear neck brace at all times except when showering or otherwise instructed. Diet Resume your normal diet.  Return to Work Will be discussed at you follow up appointment. Call Your Doctor If Any of These Occur Redness, drainage, or swelling at the wound.  Temperature greater than 101 degrees. Severe pain not relieved by pain medication. Increased difficulty swallowing.  Incision starts to come apart. Follow Up Appt Call today for appointment in 1-2 weeks [id number]) or for problems.  If you have any hardware placed in your spine, you will need an x-ray before your appointment.

## 2014-11-08 NOTE — Progress Notes (Signed)
Occupational Therapy Evaluation Patient Details Name: Kayla [name] MRN: [id number] DOB: February 26, 1941 Today's Date: 11/08/2014    History of Present Illness s/p ACDF C4-5 C5-6 C6-7 postoperatively    Clinical Impression   Completed all education regarding compensatory techniques, use of AE and DME for ADL. Pt ready for D/C home. No OT follow up needed.     Follow Up Recommendations  No OT follow up;Supervision - Intermittent    Equipment Recommendations  None recommended by OT    Recommendations for Other Services       Precautions / Restrictions Precautions Precautions: Cervical Required Braces or Orthoses: Cervical Brace Cervical Brace: Soft collar Restrictions Weight Bearing Restrictions: No      Mobility Bed Mobility Overal bed mobility: Modified Independent                Transfers Overall transfer level: Modified independent                    Balance Overall balance assessment: No apparent balance deficits (not formally assessed)                                          ADL Overall ADL's : Needs assistance/impaired                                     Functional mobility during ADLs: Modified independent General ADL Comments: Completed education regarding compensaory techniques for ADL and funcitonal mobility for ADL. Educated on use and availability for AE for ADL and functional mobility for ADL. Pt verbalized understanding. Educated on home safety and reducing risk of falls.                      Pertinent Vitals/Pain Pain Assessment: 0-10 Pain Score: 3  Pain Location: neck Pain Descriptors / Indicators: Aching;Sore Pain Intervention(s): Limited activity within patient's tolerance     Hand Dominance     Extremity/Trunk Assessment Upper Extremity Assessment Upper Extremity Assessment: Overall WFL for tasks assessed   Lower Extremity Assessment Lower Extremity Assessment: Defer to PT  evaluation   Cervical / Trunk Assessment Cervical / Trunk Assessment: Normal   Communication Communication Communication: No difficulties   Cognition Arousal/Alertness: Awake/alert Behavior During Therapy: WFL for tasks assessed/performed Overall Cognitive Status: Within Functional Limits for tasks assessed                                        Home Living Family/patient expects to be discharged to:: Private residence Living Arrangements: Spouse/significant other Available Help at Discharge: Available 24 hours/day Type of Home: House Home Access: Stairs to enter     Home Layout: One level     Bathroom Shower/Tub: Tub/shower unit Shower/tub characteristics: Architectural technologist: Standard Bathroom Accessibility: Yes How Accessible: Accessible via walker            Prior Functioning/Environment Level of Independence: Independent             OT Diagnosis: Generalized weakness;Acute pain   OT Problem List: Decreased range of motion;Decreased activity tolerance;Obesity;Pain;Decreased knowledge of use of DME or AE;Decreased knowledge of precautions   OT Treatment/Interventions:      OT Goals(Current goals can be found  in the care plan section) Acute Rehab OT Goals Patient Stated Goal: to gohome OT Goal Formulation: All assessment and education complete, DC therapy  OT Frequency:     Barriers to D/C:            Co-evaluation              End of Session Equipment Utilized During Treatment: Cervical collar Nurse Communication: Mobility status  Activity Tolerance: Patient tolerated treatment well Patient left: in bed;with call bell/phone within reach;with family/visitor present   Time: [id number] OT Time Calculation (min): 12 min Charges:  OT General Charges $OT Visit: 1 Procedure OT Evaluation $Initial OT Evaluation Tier I: 1 Procedure G-Codes:    Leary Mcnulty,HILLARY 11-25-2014, 10:30 AM   Maurie Boettcher, OTR/L  [phone] 11-25-14

## 2014-11-08 NOTE — Progress Notes (Signed)
Pt doing well. Pt and husband given D/C instructions with Rx, verbal understanding was provided. Pt's incision is covered with Honeycomb dressing and is clean and dry. Pt's IV was removed prior to D/c. Pt D/C'd home via wheelchair @ [phone] per MD order. Pt is stable @ D/C and has no other needs at this time. Holli Humbles, RN

## 2014-11-08 NOTE — Progress Notes (Signed)
Utilization review completed.  

## 2014-11-08 NOTE — Progress Notes (Signed)
Patient ID: MERIDIAN SOU, female   DOB: [birth year], 74 y.o.   MRN: [id number] Patient doing well significant room and arm pain no headaches minimal nausea some dysphagia  Strength out of 5 wound clean dry and intact  DC home

## 2014-11-08 NOTE — Discharge Summary (Signed)
Physician Discharge Summary  Patient ID: Kayla [name] MRN: [id number] DOB/AGE: 06-Jan-1941 74 y.o.  Admit date: 11/07/2014 Discharge date: 11/08/2014  Admission Diagnoses: Cervical spondylosis and stenosis C4-C7  Discharge Diagnoses: Same Active Problems:   Spinal stenosis of cervical region   Discharged Condition: good  Hospital Course: Patient is midline hospital underwent ACDF C4-5 C5-6 C6-7 postoperatively patient did very well recovered in the floor on the floor she was ambulating and voiding spontaneously tolerating regular diet stable to discharge home  Consults: Significant Diagnostic Studies: Treatments: ACDF C4-5, C5-6, C6-7 Discharge Exam: Blood pressure 169/71, pulse 76, temperature 97.6 F (36.4 C), temperature source Oral, resp. rate 20, weight 108.863 kg (240 lb), SpO2 99 %. Strength out of 5 wound clean dry and intact  Disposition: Home     Medication List    TAKE these medications        albuterol 108 (90 BASE) MCG/ACT inhaler  Commonly known as:  PROAIR HFA  Inhale 2 puffs into the lungs every 6 (six) hours as needed for wheezing or shortness of breath.     amLODipine 5 MG tablet  Commonly known as:  NORVASC  Take 5 mg by mouth daily. Once a day     aspirin 81 MG tablet  Take 81 mg by mouth daily.     B-12 PO  Take 1 tablet by mouth daily.     budesonide-formoterol 160-4.5 MCG/ACT inhaler  Commonly known as:  SYMBICORT  Inhale 2 puffs into the lungs 2 (two) times daily.     D3-1000 PO  Take 1 tablet by mouth daily.     EYE ALLERGY RELIEF OP  Apply 1-2 drops to eye 2 (two) times daily as needed (allergy eyes).     fenofibrate 160 MG tablet  Take 160 mg by mouth daily.     fluticasone 50 MCG/ACT nasal spray  Commonly known as:  FLONASE  USE 2 SPRAYS NASALLY DAILY     gabapentin 600 MG tablet  Commonly known as:  NEURONTIN  Take 600 mg by mouth 3 (three) times daily.     HYDROcodone-acetaminophen 5-325 MG per tablet  Commonly  known as:  NORCO/VICODIN  Take 1 tablet by mouth every 6 (six) hours as needed for moderate pain.     insulin glargine 100 UNIT/ML injection  Commonly known as:  LANTUS  Inject 100 Units into the skin 2 (two) times daily.     levothyroxine 175 MCG tablet  Commonly known as:  SYNTHROID, LEVOTHROID  Take 175 mcg by mouth See admin instructions. Alternates with 200 mcg every other day     levothyroxine 200 MCG tablet  Commonly known as:  SYNTHROID, LEVOTHROID  Take 200 mcg by mouth See admin instructions. Alternates with 175 mcg every other day     LORazepam 2 MG tablet  Commonly known as:  ATIVAN  Take 2 mg by mouth daily as needed. FOR DEPRESSION     meclizine 25 MG tablet  Commonly known as:  ANTIVERT  Take 25 mg by mouth 2 (two) times daily as needed for dizziness.     metFORMIN 500 MG tablet  Commonly known as:  GLUCOPHAGE  Take 1,000 mg by mouth 2 (two) times daily with a meal.     metoprolol tartrate 25 MG tablet  Commonly known as:  LOPRESSOR  Take 25 mg by mouth 2 (two) times daily.     nitroGLYCERIN 0.4 MG SL tablet  Commonly known as:  NITROSTAT  Place 1 tablet (0.4  mg total) under the tongue every 5 (five) minutes as needed for chest pain.     omeprazole 20 MG capsule  Commonly known as:  PRILOSEC  Take 20 mg by mouth daily.     oxyCODONE-acetaminophen 5-325 MG per tablet  Commonly known as:  PERCOCET/ROXICET  Take 1-2 tablets by mouth every 4 (four) hours as needed for moderate pain.     PHENADOZ 25 MG suppository  Generic drug:  promethazine  Place 2.5 mg rectally daily as needed. (FOR HEADACHES)     simvastatin 20 MG tablet  Commonly known as:  ZOCOR  Take 1 tablet by mouth daily.     spironolactone 50 MG tablet  Commonly known as:  ALDACTONE  Take 50 mg by mouth as needed (swelling). As needed     traMADol 50 MG tablet  Commonly known as:  ULTRAM  Take 50 mg by mouth every 12 (twelve) hours as needed for moderate pain.      valsartan-hydrochlorothiazide 320-25 MG per tablet  Commonly known as:  DIOVAN-HCT  Take 1 tablet by mouth daily.     ZETIA 10 MG tablet  Generic drug:  ezetimibe  Take 10 mg by mouth daily.           Follow-up Information    Follow up with [facility], MD.   Specialty:  Neurosurgery   Contact information:   [address] [place] [address] [place] [address] [phone]       Signed: Elaina Hoops 11/08/2014, 7:24 AM

## 2014-11-11 ENCOUNTER — Emergency Department (HOSPITAL_COMMUNITY): Payer: Medicare Other

## 2014-11-11 ENCOUNTER — Emergency Department (HOSPITAL_COMMUNITY)
Admission: EM | Admit: 2014-11-11 | Discharge: 2014-11-11 | Disposition: A | Discharge status: Home / Self Care | Payer: Medicare Other | Attending: Emergency Medicine | Admitting: Emergency Medicine

## 2014-11-11 ENCOUNTER — Encounter (HOSPITAL_COMMUNITY): Payer: Self-pay | Admitting: Emergency Medicine

## 2014-11-11 DIAGNOSIS — Z79899 Other long term (current) drug therapy: Secondary | ICD-10-CM | POA: Diagnosis not present

## 2014-11-11 DIAGNOSIS — M542 Cervicalgia: Secondary | ICD-10-CM | POA: Insufficient documentation

## 2014-11-11 DIAGNOSIS — M1990 Unspecified osteoarthritis, unspecified site: Secondary | ICD-10-CM | POA: Diagnosis not present

## 2014-11-11 DIAGNOSIS — R0789 Other chest pain: Secondary | ICD-10-CM | POA: Diagnosis not present

## 2014-11-11 DIAGNOSIS — I2510 Atherosclerotic heart disease of native coronary artery without angina pectoris: Secondary | ICD-10-CM | POA: Insufficient documentation

## 2014-11-11 DIAGNOSIS — G8918 Other acute postprocedural pain: Secondary | ICD-10-CM

## 2014-11-11 DIAGNOSIS — J441 Chronic obstructive pulmonary disease with (acute) exacerbation: Secondary | ICD-10-CM | POA: Insufficient documentation

## 2014-11-11 DIAGNOSIS — Z7982 Long term (current) use of aspirin: Secondary | ICD-10-CM | POA: Diagnosis not present

## 2014-11-11 DIAGNOSIS — E119 Type 2 diabetes mellitus without complications: Secondary | ICD-10-CM | POA: Insufficient documentation

## 2014-11-11 DIAGNOSIS — R1310 Dysphagia, unspecified: Secondary | ICD-10-CM

## 2014-11-11 DIAGNOSIS — R112 Nausea with vomiting, unspecified: Secondary | ICD-10-CM | POA: Diagnosis present

## 2014-11-11 DIAGNOSIS — Z794 Long term (current) use of insulin: Secondary | ICD-10-CM | POA: Insufficient documentation

## 2014-11-11 DIAGNOSIS — E785 Hyperlipidemia, unspecified: Secondary | ICD-10-CM | POA: Diagnosis not present

## 2014-11-11 DIAGNOSIS — R079 Chest pain, unspecified: Secondary | ICD-10-CM | POA: Insufficient documentation

## 2014-11-11 DIAGNOSIS — E669 Obesity, unspecified: Secondary | ICD-10-CM | POA: Diagnosis not present

## 2014-11-11 DIAGNOSIS — K219 Gastro-esophageal reflux disease without esophagitis: Secondary | ICD-10-CM | POA: Insufficient documentation

## 2014-11-11 DIAGNOSIS — Z87891 Personal history of nicotine dependence: Secondary | ICD-10-CM | POA: Diagnosis not present

## 2014-11-11 DIAGNOSIS — G4733 Obstructive sleep apnea (adult) (pediatric): Secondary | ICD-10-CM | POA: Diagnosis not present

## 2014-11-11 LAB — CBC WITH DIFFERENTIAL/PLATELET
Basophils Absolute: 0.1 10*3/uL (ref 0.0–0.1)
Basophils Relative: 0 % (ref 0–1)
Eosinophils Absolute: 0.2 10*3/uL (ref 0.0–0.7)
Eosinophils Relative: 2 % (ref 0–5)
HCT: 35.7 % — ABNORMAL LOW (ref 36.0–46.0)
Hemoglobin: 11.7 g/dL — ABNORMAL LOW (ref 12.0–15.0)
Lymphocytes Relative: 15 % (ref 12–46)
Lymphs Abs: 1.9 10*3/uL (ref 0.7–4.0)
MCH: 29.8 pg (ref 26.0–34.0)
MCHC: 32.8 g/dL (ref 30.0–36.0)
MCV: 90.8 fL (ref 78.0–100.0)
Monocytes Absolute: 0.8 10*3/uL (ref 0.1–1.0)
Monocytes Relative: 7 % (ref 3–12)
Neutro Abs: 9.1 10*3/uL — ABNORMAL HIGH (ref 1.7–7.7)
Neutrophils Relative %: 76 % (ref 43–77)
Platelets: 412 10*3/uL — ABNORMAL HIGH (ref 150–400)
RBC: 3.93 MIL/uL (ref 3.87–5.11)
RDW: 13.2 % (ref 11.5–15.5)
WBC: 12 10*3/uL — ABNORMAL HIGH (ref 4.0–10.5)

## 2014-11-11 LAB — BASIC METABOLIC PANEL
Anion gap: 14 (ref 5–15)
BUN: 18 mg/dL (ref 6–20)
CO2: 26 mmol/L (ref 22–32)
Calcium: 9.6 mg/dL (ref 8.9–10.3)
Chloride: 95 mmol/L — ABNORMAL LOW (ref 101–111)
Creatinine, Ser: 1.36 mg/dL — ABNORMAL HIGH (ref 0.44–1.00)
GFR calc Af Amer: 44 mL/min — ABNORMAL LOW (ref >60)
GFR calc non Af Amer: 38 mL/min — ABNORMAL LOW (ref >60)
Glucose, Bld: 136 mg/dL — ABNORMAL HIGH (ref 65–99)
Potassium: 3.8 mmol/L (ref 3.5–5.1)
Sodium: 135 mmol/L (ref 135–145)

## 2014-11-11 LAB — I-STAT TROPONIN, ED: Troponin i, poc: 0.01 ng/mL (ref 0.00–0.08)

## 2014-11-11 MED ORDER — ONDANSETRON HCL 4 MG/2ML IJ SOLN
4.0000 mg | Freq: Once | INTRAMUSCULAR | Status: AC
  Administered 2014-11-11: 4 mg via INTRAVENOUS
  Filled 2014-11-11: qty 2

## 2014-11-11 MED ORDER — MORPHINE SULFATE 4 MG/ML IJ SOLN
4.0000 mg | Freq: Once | INTRAMUSCULAR | Status: AC
  Administered 2014-11-11: 4 mg via INTRAVENOUS
  Filled 2014-11-11: qty 1

## 2014-11-11 MED ORDER — ONDANSETRON 4 MG PO TBDP: ORAL_TABLET | ORAL | Status: DC

## 2014-11-11 NOTE — ED Provider Notes (Signed)
CSN: [id number]     Arrival date & time 11/11/14  1004 History   First MD Initiated Contact with Patient 11/11/14 1019     Chief Complaint  Patient presents with  . Emesis  . Chest Pain     (Consider location/radiation/quality/duration/timing/severity/associated sxs/prior Treatment) HPI Comments: 74 year old female presenting with vomiting over the past 2 days.  She had anterior cervical fusion surgery on 7/6 and was discharged on 7/7 by Dr. Saintclair Halsted.  She was discharged home with Percocet without anything for nausea.  She believes the Percocet may be upsetting her stomach and now she cannot keep anything down.  She's had about 4 episodes of nonbloody, nonbilious emesis over the past 24 hours.  States she is having difficulty swallowing but is requesting something to drink.  States she has some pain in the back of her neck, and states "he told me I would be in a lot of pain".  Since being discharged, she is gradually developed generalized chest pressure and shortness of breath.  Denies fevers.  Patient is a 74 y.o. female presenting with vomiting and chest pain. The history is provided by the patient and the spouse.  Emesis Chest Pain Associated symptoms: dysphagia, nausea and vomiting     Past Medical History  Diagnosis Date  . GERD (gastroesophageal reflux disease)   . Diverticula of colon   . CAD (coronary artery disease)     Mild per remote cath in 2006, /    Nuclear, January, 2013, no significant abnormality,  . Hyperlipidemia   . Pulmonary hypertension, secondary   . Leg edema     occasional swelling  . Goiter     hx of  . Obesity   . Diabetes mellitus, type 2   . Hypothyroidism   . Left ventricular hypertrophy   . Carotid artery disease     Doppler, January, 2013, 0 [id number] RIC A., [id number] LICA, stable  . Dizziness     occasional  . COPD (chronic obstructive pulmonary disease)   . Headache(784.0)     migraine  . Arthritis   . Complication of anesthesia     wakes up "mean"   . OSA (obstructive sleep apnea)     cpap setting of 12  . PONV (postoperative nausea and vomiting)     severe after every surgery   Past Surgical History  Procedure Laterality Date  . Thyroidectomy  2011  . Back surgery  1990's    lower back  . Total knee arthroplasty  07/27/2011    Procedure: TOTAL KNEE ARTHROPLASTY;  Surgeon: Mauri Pole, MD;  Location: [facility];  Service: Orthopedics;  Laterality: Left;  . Abdominal hysterectomy    . Anterior cervical decomp/discectomy fusion N/A 11/07/2014    Procedure: Anterior Cervical diskectomy and fusion Cervical four-five, Cervical five-six, Cervical six-seven;  Surgeon: Kary Kos, MD;  Location: [facility];  Service: Neurosurgery;  Laterality: N/A;   Family History  Problem Relation Age of Onset  . Heart Problems Mother   . Heart Problems Father    History  Substance Use Topics  . Smoking status: Former Smoker -- 1.50 packs/day for 34 years    Types: Cigarettes    Quit date: 05/04/1997  . Smokeless tobacco: Never Used  . Alcohol Use: No   OB History    No data available     Review of Systems  HENT: Positive for trouble swallowing.   Cardiovascular: Positive for chest pain.  Gastrointestinal: Positive for nausea and vomiting.  Musculoskeletal:  Positive for neck pain.  All other systems reviewed and are negative.     Allergies  Review of patient's allergies indicates no known allergies.  Home Medications   Prior to Admission medications   Medication Sig Start Date End Date Taking? Authorizing Provider  acetaminophen (TYLENOL) 500 MG tablet Take 1,000 mg by mouth every 8 (eight) hours as needed for mild pain or moderate pain.   Yes Historical Provider, MD  albuterol (PROAIR HFA) 108 (90 BASE) MCG/ACT inhaler Inhale 2 puffs into the lungs every 6 (six) hours as needed for wheezing or shortness of breath. 06/27/14  Yes Chesley Mires, MD  albuterol (PROVENTIL) (2.5 MG/3ML) 0.083% nebulizer solution Take 2.5 mg by nebulization  every 6 (six) hours as needed for wheezing or shortness of breath.   Yes Historical Provider, MD  amLODipine (NORVASC) 5 MG tablet Take 5 mg by mouth daily. Once a day 08/13/11  Yes Historical Provider, MD  aspirin 81 MG tablet Take 81 mg by mouth daily.   Yes Historical Provider, MD  budesonide-formoterol (SYMBICORT) 160-4.5 MCG/ACT inhaler Inhale 2 puffs into the lungs 2 (two) times daily. 07/20/14  Yes Collene Gobble, MD  Cholecalciferol (D3-1000 PO) Take 1 tablet by mouth daily.   Yes Historical Provider, MD  Cyanocobalamin (B-12 PO) Take 1 tablet by mouth daily.   Yes Historical Provider, MD  fenofibrate 160 MG tablet Take 160 mg by mouth daily. 08/16/14  Yes Historical Provider, MD  fluticasone (FLONASE) 50 MCG/ACT nasal spray USE 2 SPRAYS NASALLY DAILY Patient taking differently: USE 2 SPRAYS NASALLY DAILY AS NEEDED 10/19/14  Yes Chesley Mires, MD  gabapentin (NEURONTIN) 600 MG tablet Take 600 mg by mouth 3 (three) times daily.  03/10/13  Yes Historical Provider, MD  insulin glargine (LANTUS) 100 UNIT/ML injection Inject 100 Units into the skin 2 (two) times daily.    Yes Historical Provider, MD  levothyroxine (SYNTHROID, LEVOTHROID) 175 MCG tablet Take 175 mcg by mouth See admin instructions. Alternates with 200 mcg every other day   Yes Historical Provider, MD  levothyroxine (SYNTHROID, LEVOTHROID) 200 MCG tablet Take 200 mcg by mouth See admin instructions. Alternates with 175 mcg every other day   Yes Historical Provider, MD  LORazepam (ATIVAN) 2 MG tablet Take 2 mg by mouth daily as needed. FOR DEPRESSION 08/20/14  Yes Historical Provider, MD  meclizine (ANTIVERT) 25 MG tablet Take 25 mg by mouth 2 (two) times daily as needed for dizziness.  10/18/13  Yes Historical Provider, MD  metFORMIN (GLUCOPHAGE) 500 MG tablet Take 1,000 mg by mouth 2 (two) times daily with a meal.   Yes Historical Provider, MD  metoprolol tartrate (LOPRESSOR) 25 MG tablet Take 25 mg by mouth 2 (two) times daily.   Yes  Historical Provider, MD  Naphazoline-Pheniramine (EYE ALLERGY RELIEF OP) Apply 1-2 drops to eye 2 (two) times daily as needed (allergy eyes).    Yes Historical Provider, MD  nitroGLYCERIN (NITROSTAT) 0.4 MG SL tablet Place 1 tablet (0.4 mg total) under the tongue every 5 (five) minutes as needed for chest pain. 10/09/14 11/30/17 Yes Imogene Burn, PA-C  omeprazole (PRILOSEC) 20 MG capsule Take 20 mg by mouth daily.   Yes Historical Provider, MD  oxyCODONE-acetaminophen (PERCOCET/ROXICET) 5-325 MG per tablet Take 1-2 tablets by mouth every 4 (four) hours as needed for moderate pain. 11/08/14  Yes Kary Kos, MD  PHENADOZ 25 MG suppository Place 2.5 mg rectally daily as needed. (FOR HEADACHES) 09/26/14  Yes Historical Provider, MD  simvastatin (  ZOCOR) 20 MG tablet Take 1 tablet by mouth daily. 12/14/12  Yes Historical Provider, MD  spironolactone (ALDACTONE) 50 MG tablet Take 50 mg by mouth as needed (swelling). As needed 06/11/12  Yes Historical Provider, MD  traMADol (ULTRAM) 50 MG tablet Take 50 mg by mouth every 12 (twelve) hours as needed for moderate pain.   Yes Historical Provider, MD  valsartan-hydrochlorothiazide (DIOVAN-HCT) 320-25 MG per tablet Take 1 tablet by mouth daily.   Yes Historical Provider, MD  ZETIA 10 MG tablet Take 10 mg by mouth daily.  10/09/13  Yes Historical Provider, MD  ondansetron (ZOFRAN ODT) 4 MG disintegrating tablet 4mg  ODT q4 hours prn nausea/vomit 11/11/14   Brentley Horrell M Bobby Barton, PA-C   BP 140/57 mmHg  Pulse 76  Temp(Src) 98 F (36.7 C) (Oral)  Resp 21  Ht 5\' 6"  (1.676 m)  Wt 240 lb (108.863 kg)  BMI 38.76 kg/m2  SpO2 93% Physical Exam  Constitutional: She is oriented to person, place, and time. She appears well-developed and well-nourished. No distress.  HENT:  Head: Normocephalic and atraumatic.  Mouth/Throat: Uvula is midline, oropharynx is clear and moist and mucous membranes are normal.  Eyes: Conjunctivae and EOM are normal.  Neck: Trachea normal and phonation  normal. Neck supple. No spinous process tenderness and no muscular tenderness present. No rigidity. No edema present.  Post-op bandage on L anterior neck. No swelling, erythema or warmth.   Cardiovascular: Normal rate, regular rhythm and normal heart sounds.   Pulmonary/Chest: Effort normal and breath sounds normal. No stridor. No respiratory distress. She has no decreased breath sounds. She has no wheezes. She has no rhonchi. She has no rales.  Abdominal: Soft. Bowel sounds are normal. She exhibits no distension. There is no tenderness.  Musculoskeletal: Normal range of motion. She exhibits no edema.  Neurological: She is alert and oriented to person, place, and time. No sensory deficit.  Skin: Skin is warm and dry.  Psychiatric: She has a normal mood and affect. Her behavior is normal.  Nursing note and vitals reviewed.   ED Course  Procedures (including critical care time) Labs Review Labs Reviewed  BASIC METABOLIC PANEL - Abnormal; Notable for the following:    Chloride 95 (*)    Glucose, Bld 136 (*)    Creatinine, Ser 1.36 (*)    GFR calc non Af Amer 38 (*)    GFR calc Af Amer 44 (*)    All other components within normal limits  CBC WITH DIFFERENTIAL/PLATELET - Abnormal; Notable for the following:    WBC 12.0 (*)    Hemoglobin 11.7 (*)    HCT 35.7 (*)    Platelets 412 (*)    Neutro Abs 9.1 (*)    All other components within normal limits  I-STAT TROPOININ, ED    Imaging Review Dg Chest 2 View  11/11/2014   CLINICAL DATA:  Acute chest pain and shortness breath.  EXAM: CHEST  2 VIEW  COMPARISON:  05/30/2013 and prior radiographs  FINDINGS: The cardiomediastinal silhouette is unremarkable.  Mild basilar scarring again noted.  There is no evidence of focal airspace disease, pulmonary edema, suspicious pulmonary nodule/mass, pleural effusion, or pneumothorax. No acute bony abnormalities are identified.  IMPRESSION: No active cardiopulmonary disease.   Electronically Signed   By:  Margarette Canada M.D.   On: 11/11/2014 11:50     EKG Interpretation   Date/Time:  Sunday November 11 2014 10:27:21 EDT Ventricular Rate:  71 PR Interval:  170 QRS Duration: 94  QT Interval:  422 QTC Calculation: 459 R Axis:   22 Text Interpretation:  Sinus rhythm Abnormal R-wave progression, early  transition Baseline wander in lead(s) II III aVR aVL aVF V1 V2 V3 V4 V5 V6  No significant change since last tracing Confirmed by Debby Freiberg  [phone]) on 11/11/2014 10:37:57 AM      MDM   Final diagnoses:  Painful swallowing  Post-op pain   Non-toxic appearing, NAD. AFVSS. Has a sensation she cannot swallow due to pain. This is most-likely residual from being intubated with surgery. No stridor. Coughed during initial swallow screen. After receiving pain medication, pt able to tolerate PO and pass swallow screen without any difficulty. When asking about how she is feeling with drinking after pain medication, she states, "I'm fine, I can drink, I got pain medicine, can I go now? And will you hurry up with the papers, it's hot in here". Regarding chest pain, CXR negative for pneumonia or any other acute finding. Labs with leukocytosis of 12.0, otherwise at baseline. Doubt PE. No tachycardia or hypoxia. Very low suspicion. Pt stable for d/c. Return precautions given. Patient states understanding of treatment care plan and is agreeable.  Discussed with attending Dr. Colin Rhein who also evaluated patient and agrees with plan of care.  Carman Ching, PA-C 11/11/14 1555  Debby Freiberg, MD 11/12/14 1009

## 2014-11-11 NOTE — ED Notes (Addendum)
Pt had a fusion surgery Wednesday. Pt sent home with pain meds but no nausea meds. Pt has thrown up 3-4 times in the past 24 hours. Pt reports unable to keep any fluids down. Pt also reports pain to back of head and pain to chest. Pt also reports unable to keep BP meds down due to throwing up.

## 2014-11-11 NOTE — Discharge Instructions (Signed)
Take Zofran as directed as needed for nausea.  Dysphagia Level 1 Diet, Pureed The dysphasia level 1 diet includes foods that are completely pureed and smooth. The foods have a pudding-like texture, such as the texture of pureed pancakes, mashed potatoes, and yogurt. The diet does not include foods with lumps or coarse textures. Liquids should be smooth and may either be thin, nectar-thick, honey-like, or spoon-thick. This diet is helpful for people with moderate to severe swallowing problems. It reduces the risk of food getting caught in the windpipe, trachea, or lungs. You may need help or supervision during meals while following this diet. WHAT DO I NEED TO KNOW ABOUT THIS DIET? Foods  You may eat foods that are soft and have a pudding-like texture. If a food does not have this texture, you may be able to eat the food after:  Pureeing it. This can be done with a blender or whisk.  Moistening it with liquid. For example, you may have bread if you soak it in milk or syrup.  Avoid foods that are hard, dry, sticky, chunky, lumpy, or stringy. Also avoid foods with nuts, seeds, raisins, skins, and pulp.  Do not eat foods that you have to chew. If you have to chew the food, then you cannot eat it.  Eat a variety of foods to get all the nutrients you need. Liquids  You may drink liquids that are smooth. Your health care provider will tell you if you should drink thin or thickened liquids.  To thicken a liquid, use a food and beverage thickener or a thickening food. Thickened liquids are usually a "pudding-like" consistency.  Thin liquids include fruit juices, milk, coffee, tea, yogurts, shakes, and similar foods that melt to thin liquid at room temperature.  Avoid liquids with seeds, pulp, or chunks. See your dietitian or health care provider regularly for help with your dietary changes. WHAT FOODS CAN I EAT? Grains Store-bought soft breads, pancakes, and [place] toast that have a smooth,  moist texture and do not have nuts or seeds (you will need to moisten the food with liquid). Cooked cereals that have a pudding-like consistency, such as cream of wheat or farina (no oatmeal). Pureed, well-cooked pasta, rice, and plain bread stuffing. Vegetables Pureed vegetables. Soft avocado. Smooth tomato paste or sauce. Strained or pureed soups (these may need to be thickened as directed). Mashed or pureed potatoes without skin (can be seasoned with butter, smooth gravy, margarine, or sour cream). Fruits Pureed fruits such as melons and apples without seeds or pulp. Mashed bananas. Smooth tomato paste or sauce. Fruit juices without pulp or seeds. Strained or pureed soups. Meat and Other Protein Sources Pureed meat. Smooth pate or liverwurst. Smooth souffles. Pureed beans (such as lentils). Pureed eggs. Dairy Yogurt. Smooth cheese sauces. Milk (may need to be thickened). Nutritional dairy drinks or shakes. Ask your health care provider whether you can have ice cream. Condiments Finely ground salt, pepper, and other ground spices. Sweets/Desserts Smooth puddings and custards. Pureed desserts. Souffles. Whipped topping. Ask your health care provider whether you can have frozen desserts. Fats and Oils Butter. Margarine. Smooth and strained gravy. Sour cream. Mayonnaise. Cream cheese. Whipped topping. Smooth sauces (such as white sauce, cheese sauce, or hollandaise sauce). The items listed above may not be a complete list of recommended foods or beverages. Contact your dietitian for more options. WHAT FOODS ARE NOT RECOMMENDED? Grains Oatmeal. Dry cereals. Hard breads. Vegetables Whole vegetables. Stringy vegetables (such as celery). Thin tomato sauce. Fruits  Whole fresh, frozen, canned, or dried fruits that have not been pureed. Stringy fruits (such as pineapple). Meat and Other Protein Sources Whole or ground meat, fish, or poultry. Dried or cooked lentils or legumes that have been cooked  but not mashed or pureed. Non-pureed eggs. Nuts and seeds. Peanut butter. Dairy Non-pureed cheese. Dairy products with lumps or chunks. Ask your health care provider whether you can have ice cream. Condiments Coarse or seeded herbs and spices. Sweets/Desserts [place] preserves. Jams with seeds. Solid desserts. Sticky, chewy sweets (such as licorice and caramel). Ask your health care provider whether you can have frozen desserts. Fats and Oils Sauces of fats with lumps or chunks. The items listed above may not be a complete list of foods and beverages to avoid. Contact your dietitian for more information. Document Released: 04/20/2005 Document Revised: 09/04/2013 Document Reviewed: 04/03/2013 [facility] Patient Information 2015 [place], [place]. This information is not intended to replace advice given to you by your health care provider. Make sure you discuss any questions you have with your health care provider.

## 2014-11-11 NOTE — ED Notes (Signed)
Pt failed stoke swallow screen due to coughing when trying to eat part of cracker.

## 2014-11-11 NOTE — ED Notes (Signed)
Patient transported to X-ray 

## 2014-11-29 DIAGNOSIS — I10 Essential (primary) hypertension: Secondary | ICD-10-CM | POA: Diagnosis not present

## 2014-11-29 DIAGNOSIS — Z6838 Body mass index (BMI) 38.0-38.9, adult: Secondary | ICD-10-CM | POA: Diagnosis not present

## 2014-11-29 DIAGNOSIS — E890 Postprocedural hypothyroidism: Secondary | ICD-10-CM | POA: Diagnosis not present

## 2014-11-29 DIAGNOSIS — E1149 Type 2 diabetes mellitus with other diabetic neurological complication: Secondary | ICD-10-CM | POA: Diagnosis not present

## 2014-11-29 DIAGNOSIS — N183 Chronic kidney disease, stage 3 (moderate): Secondary | ICD-10-CM | POA: Diagnosis not present

## 2014-11-29 DIAGNOSIS — E782 Mixed hyperlipidemia: Secondary | ICD-10-CM | POA: Diagnosis not present

## 2014-11-29 DIAGNOSIS — Z79899 Other long term (current) drug therapy: Secondary | ICD-10-CM | POA: Diagnosis not present

## 2014-12-13 DIAGNOSIS — M5021 Other cervical disc displacement,  high cervical region: Secondary | ICD-10-CM | POA: Diagnosis not present

## 2014-12-13 DIAGNOSIS — M542 Cervicalgia: Secondary | ICD-10-CM | POA: Diagnosis not present

## 2014-12-28 ENCOUNTER — Encounter: Payer: Self-pay | Admitting: Cardiology

## 2014-12-28 DIAGNOSIS — R9430 Abnormal result of cardiovascular function study, unspecified: Secondary | ICD-10-CM | POA: Insufficient documentation

## 2014-12-31 ENCOUNTER — Ambulatory Visit: Payer: Medicare Other | Admitting: Cardiology

## 2015-01-02 ENCOUNTER — Encounter: Payer: Self-pay | Admitting: Cardiology

## 2015-01-02 DIAGNOSIS — E119 Type 2 diabetes mellitus without complications: Secondary | ICD-10-CM | POA: Diagnosis not present

## 2015-01-24 DIAGNOSIS — M542 Cervicalgia: Secondary | ICD-10-CM | POA: Diagnosis not present

## 2015-01-24 DIAGNOSIS — M5023 Other cervical disc displacement, cervicothoracic region: Secondary | ICD-10-CM | POA: Diagnosis not present

## 2015-01-24 DIAGNOSIS — M5021 Other cervical disc displacement,  high cervical region: Secondary | ICD-10-CM | POA: Diagnosis not present

## 2015-02-04 ENCOUNTER — Other Ambulatory Visit: Payer: Self-pay | Admitting: Pulmonary Disease

## 2015-02-14 DIAGNOSIS — Z9181 History of falling: Secondary | ICD-10-CM | POA: Diagnosis not present

## 2015-02-14 DIAGNOSIS — J019 Acute sinusitis, unspecified: Secondary | ICD-10-CM | POA: Diagnosis not present

## 2015-02-18 DIAGNOSIS — J019 Acute sinusitis, unspecified: Secondary | ICD-10-CM | POA: Diagnosis not present

## 2015-03-20 DIAGNOSIS — Z23 Encounter for immunization: Secondary | ICD-10-CM | POA: Diagnosis not present

## 2015-04-03 DIAGNOSIS — E782 Mixed hyperlipidemia: Secondary | ICD-10-CM | POA: Diagnosis not present

## 2015-04-03 DIAGNOSIS — E890 Postprocedural hypothyroidism: Secondary | ICD-10-CM | POA: Diagnosis not present

## 2015-04-03 DIAGNOSIS — E559 Vitamin D deficiency, unspecified: Secondary | ICD-10-CM | POA: Diagnosis not present

## 2015-04-04 DIAGNOSIS — M5023 Other cervical disc displacement, cervicothoracic region: Secondary | ICD-10-CM | POA: Diagnosis not present

## 2015-04-04 DIAGNOSIS — M542 Cervicalgia: Secondary | ICD-10-CM | POA: Diagnosis not present

## 2015-04-10 DIAGNOSIS — E782 Mixed hyperlipidemia: Secondary | ICD-10-CM | POA: Diagnosis not present

## 2015-04-10 DIAGNOSIS — F419 Anxiety disorder, unspecified: Secondary | ICD-10-CM | POA: Diagnosis not present

## 2015-04-10 DIAGNOSIS — E890 Postprocedural hypothyroidism: Secondary | ICD-10-CM | POA: Diagnosis not present

## 2015-04-10 DIAGNOSIS — E559 Vitamin D deficiency, unspecified: Secondary | ICD-10-CM | POA: Diagnosis not present

## 2015-04-10 DIAGNOSIS — I10 Essential (primary) hypertension: Secondary | ICD-10-CM | POA: Diagnosis not present

## 2015-04-10 DIAGNOSIS — E1149 Type 2 diabetes mellitus with other diabetic neurological complication: Secondary | ICD-10-CM | POA: Diagnosis not present

## 2015-04-10 DIAGNOSIS — Z6836 Body mass index (BMI) 36.0-36.9, adult: Secondary | ICD-10-CM | POA: Diagnosis not present

## 2015-04-11 ENCOUNTER — Telehealth: Payer: Self-pay | Admitting: Pulmonary Disease

## 2015-04-11 NOTE — Telephone Encounter (Signed)
Okay to double book visit. 

## 2015-04-11 NOTE — Telephone Encounter (Signed)
LMTCB x 1 

## 2015-04-11 NOTE — Telephone Encounter (Signed)
Spoke with pt, states her insurance is changing in January and needs to be seen by VS before the end of the year.  I advised that VS has no openings through the rest of the month.  Pt states she is having no problems but still wants to see VS before the end of the month.    VS please advise if this pt can be worked in this month.  Thanks.

## 2015-04-12 NOTE — Telephone Encounter (Signed)
Left message for pt to call back to schedule Per Ashtyyn> Try to schedule pt on 12/13 at either 2pm, or 3pm

## 2015-04-12 NOTE — Telephone Encounter (Signed)
Patient called back and wants to book 12/13 at 2 pm.  I was unable to double book this.  She asked that you call back and leave a message confirming the appointment.

## 2015-04-12 NOTE — Telephone Encounter (Signed)
Appt scheduled. Pt aware.  Nothing further needed.

## 2015-04-16 ENCOUNTER — Ambulatory Visit (INDEPENDENT_AMBULATORY_CARE_PROVIDER_SITE_OTHER): Payer: Medicare Other | Admitting: Pulmonary Disease

## 2015-04-16 ENCOUNTER — Encounter: Payer: Self-pay | Admitting: Pulmonary Disease

## 2015-04-16 VITALS — BP 120/62 | HR 62 | Ht 66.0 in | Wt 235.0 lb

## 2015-04-16 DIAGNOSIS — G4733 Obstructive sleep apnea (adult) (pediatric): Secondary | ICD-10-CM

## 2015-04-16 DIAGNOSIS — J449 Chronic obstructive pulmonary disease, unspecified: Secondary | ICD-10-CM

## 2015-04-16 DIAGNOSIS — I2510 Atherosclerotic heart disease of native coronary artery without angina pectoris: Secondary | ICD-10-CM | POA: Diagnosis not present

## 2015-04-16 DIAGNOSIS — Z9989 Dependence on other enabling machines and devices: Principal | ICD-10-CM

## 2015-04-16 MED ORDER — FLUTICASONE PROPIONATE 50 MCG/ACT NA SUSP: NASAL | Status: DC

## 2015-04-16 MED ORDER — ALBUTEROL SULFATE HFA 108 (90 BASE) MCG/ACT IN AERS: 2.0000 | INHALATION_SPRAY | Freq: Four times a day (QID) | RESPIRATORY_TRACT | Status: DC | PRN

## 2015-04-16 MED ORDER — BUDESONIDE-FORMOTEROL FUMARATE 160-4.5 MCG/ACT IN AERO: 2.0000 | INHALATION_SPRAY | Freq: Two times a day (BID) | RESPIRATORY_TRACT | Status: DC

## 2015-04-16 NOTE — Patient Instructions (Signed)
Check with your primary physician about whether you have received Prevnar vaccine before  Follow up in 1 year

## 2015-04-16 NOTE — Progress Notes (Signed)
Chief Complaint  Patient presents with  . Follow-up    Using CPAP machine nightly x 5-6 hrs per night. Denies problems with mask/pressure.     Tests PSG 02/2005 >> AHI 10  Spirometry 10/19/08 >> FEV1 1.55 (68%), FEV1% 70  Spirometry 08/29/12 >> FEV1 1.38 (59%), FEV1% 58 Auto CPAP 09/21/12 to 10/05/12 >> used on 15 of 15 nights with average 10 hrs 22 min.  Average AHI 1.8 with mean CPAP 5 cm H2O and 90 th percentile CPAP 6 cm H2O. CPAP titration 02/19/14 >> CPAP 11 cm H2O >> AHI 0.7, PLMI 81.4, did not need supplemental oxygen  Past medical history GERD, HLD, DM, hypothyroidism, CAD  PSHx, Medications, Allergies, Fhx, Shx reviewed.  BP 120/62 mmHg  Pulse 62  Ht 5\' 6"  (1.676 m)  Wt 235 lb (106.595 kg)  BMI 37.95 kg/m2  SpO2 95%   History of Present Illness: Kayla [name] is a 74 y.o. female with COPD and OSA using CPAP.  She has been doing well.  No incident with breathing after neck surgery.  She denies cough, wheeze, sputum.  She is not having leg swelling.  She uses albuterol intermittently.  She is not sure if she had Prevnar before.  She is going to change to [facility] for insurance in 2017.  She is doing well with CPAP.   Physical Exam:  General - Obese HEENT - wears glasses, pupils equal/reactive, no sinus tenderness, no oral exudate, no LAN Cardiac - s1s2 regular, no murmur Chest - no wheeze/rales Abdomen - soft, nontender Extremities - no edema Skin - no rashes Neurologic - normal strength, CN intact Psychiatric - normal mood, behavior  Assessment/Plan:  Obstructive sleep apnea. Plan: - continue auto CPAP range 5 to 15 cm H2O  COPD with chronic bronchitis. Plan: - continue symbicort with prn albuterol >> she prefers proair  Vaccinations. Plan: - she will check with her PCP about whether she had Prevnar shot before    Patient Instructions  Check with your primary physician about whether you have received Prevnar vaccine before  Follow up in 1  year    Chesley Mires, MD [place] 04/16/2015, 2:42 PM Pager:  [phone] After 3pm call: [phone]

## 2015-05-05 HISTORY — PX: INSERT / REPLACE / REMOVE PACEMAKER: SUR710

## 2015-06-19 DIAGNOSIS — J441 Chronic obstructive pulmonary disease with (acute) exacerbation: Secondary | ICD-10-CM | POA: Diagnosis not present

## 2015-06-19 DIAGNOSIS — J019 Acute sinusitis, unspecified: Secondary | ICD-10-CM | POA: Diagnosis not present

## 2015-06-19 DIAGNOSIS — Z6837 Body mass index (BMI) 37.0-37.9, adult: Secondary | ICD-10-CM | POA: Diagnosis not present

## 2015-06-19 DIAGNOSIS — Z1389 Encounter for screening for other disorder: Secondary | ICD-10-CM | POA: Diagnosis not present

## 2015-07-04 DIAGNOSIS — I10 Essential (primary) hypertension: Secondary | ICD-10-CM | POA: Diagnosis not present

## 2015-07-04 DIAGNOSIS — Z6837 Body mass index (BMI) 37.0-37.9, adult: Secondary | ICD-10-CM | POA: Diagnosis not present

## 2015-07-04 DIAGNOSIS — M5023 Other cervical disc displacement, cervicothoracic region: Secondary | ICD-10-CM | POA: Diagnosis not present

## 2015-08-07 ENCOUNTER — Other Ambulatory Visit (HOSPITAL_COMMUNITY): Payer: Self-pay | Admitting: Family Medicine

## 2015-08-07 DIAGNOSIS — I6523 Occlusion and stenosis of bilateral carotid arteries: Secondary | ICD-10-CM

## 2015-08-09 ENCOUNTER — Ambulatory Visit (HOSPITAL_COMMUNITY)
Admission: RE | Admit: 2015-08-09 | Discharge: 2015-08-09 | Disposition: A | Discharge status: Home / Self Care | Payer: Commercial Managed Care - HMO | Source: Ambulatory Visit | Attending: Internal Medicine | Admitting: Internal Medicine

## 2015-08-09 DIAGNOSIS — E119 Type 2 diabetes mellitus without complications: Secondary | ICD-10-CM | POA: Insufficient documentation

## 2015-08-09 DIAGNOSIS — E785 Hyperlipidemia, unspecified: Secondary | ICD-10-CM | POA: Insufficient documentation

## 2015-08-09 DIAGNOSIS — I6523 Occlusion and stenosis of bilateral carotid arteries: Secondary | ICD-10-CM | POA: Diagnosis not present

## 2015-08-09 DIAGNOSIS — K219 Gastro-esophageal reflux disease without esophagitis: Secondary | ICD-10-CM | POA: Diagnosis not present

## 2015-08-12 DIAGNOSIS — E782 Mixed hyperlipidemia: Secondary | ICD-10-CM | POA: Diagnosis not present

## 2015-08-12 DIAGNOSIS — E559 Vitamin D deficiency, unspecified: Secondary | ICD-10-CM | POA: Diagnosis not present

## 2015-08-12 DIAGNOSIS — E1149 Type 2 diabetes mellitus with other diabetic neurological complication: Secondary | ICD-10-CM | POA: Diagnosis not present

## 2015-08-12 DIAGNOSIS — E890 Postprocedural hypothyroidism: Secondary | ICD-10-CM | POA: Diagnosis not present

## 2015-08-14 DIAGNOSIS — E559 Vitamin D deficiency, unspecified: Secondary | ICD-10-CM | POA: Diagnosis not present

## 2015-08-14 DIAGNOSIS — Z139 Encounter for screening, unspecified: Secondary | ICD-10-CM | POA: Diagnosis not present

## 2015-08-14 DIAGNOSIS — N183 Chronic kidney disease, stage 3 (moderate): Secondary | ICD-10-CM | POA: Diagnosis not present

## 2015-08-14 DIAGNOSIS — E1149 Type 2 diabetes mellitus with other diabetic neurological complication: Secondary | ICD-10-CM | POA: Diagnosis not present

## 2015-08-14 DIAGNOSIS — E782 Mixed hyperlipidemia: Secondary | ICD-10-CM | POA: Diagnosis not present

## 2015-08-14 DIAGNOSIS — E669 Obesity, unspecified: Secondary | ICD-10-CM | POA: Diagnosis not present

## 2015-08-14 DIAGNOSIS — M545 Low back pain: Secondary | ICD-10-CM | POA: Diagnosis not present

## 2015-08-14 DIAGNOSIS — E890 Postprocedural hypothyroidism: Secondary | ICD-10-CM | POA: Diagnosis not present

## 2015-08-14 DIAGNOSIS — I10 Essential (primary) hypertension: Secondary | ICD-10-CM | POA: Diagnosis not present

## 2015-08-15 ENCOUNTER — Ambulatory Visit: Payer: Medicare Other | Admitting: Pulmonary Disease

## 2015-08-22 ENCOUNTER — Encounter: Payer: Self-pay | Admitting: Cardiology

## 2015-08-22 ENCOUNTER — Ambulatory Visit (INDEPENDENT_AMBULATORY_CARE_PROVIDER_SITE_OTHER): Payer: Commercial Managed Care - HMO | Admitting: Cardiology

## 2015-08-22 VITALS — BP 132/80 | HR 57 | Ht 66.0 in | Wt 236.0 lb

## 2015-08-22 DIAGNOSIS — I2510 Atherosclerotic heart disease of native coronary artery without angina pectoris: Secondary | ICD-10-CM | POA: Diagnosis not present

## 2015-08-22 DIAGNOSIS — I6523 Occlusion and stenosis of bilateral carotid arteries: Secondary | ICD-10-CM | POA: Diagnosis not present

## 2015-08-22 DIAGNOSIS — I10 Essential (primary) hypertension: Secondary | ICD-10-CM

## 2015-08-22 DIAGNOSIS — R002 Palpitations: Secondary | ICD-10-CM | POA: Diagnosis not present

## 2015-08-22 DIAGNOSIS — I739 Peripheral vascular disease, unspecified: Secondary | ICD-10-CM

## 2015-08-22 DIAGNOSIS — I779 Disorder of arteries and arterioles, unspecified: Secondary | ICD-10-CM

## 2015-08-22 NOTE — Patient Instructions (Signed)
Medication Instructions:  Your physician recommends that you continue on your current medications as directed. Please refer to the Current Medication list given to you today.   Labwork: None ordered  Testing/Procedures: None ordered  Follow-Up: Your physician wants you to follow-up in: 12 month with Dr.Skains You will receive a reminder letter in the mail two months in advance. If you don't receive a letter, please call our office to schedule the follow-up appointment.   Any Other Special Instructions Will Be Listed Below (If Applicable).     If you need a refill on your cardiac medications before your next appointment, please call your pharmacy.

## 2015-08-22 NOTE — Progress Notes (Signed)
Cardiology Office Note    Date:  08/22/2015   ID:  Brisa, Pratt [birth year], MRN [id number]  PCP:  [name], MD  Cardiologist:   Candee Furbish, MD     History of Present Illness:  Kayla [name] is a 75 y.o. female former patient of Dr. Ron Parker here for follow-up, CAD. Was seen previously on 10/09/14 by Margarite Gouge for preoperative risk assessment for Dr. Saintclair Halsted, surgical cervical fusion. Low risk Myoview in 2013, catheterization 2006 nonobstructive CAD, echocardiogram 2015 normal EF 48 hour Holter monitor 2015 PACs, PVCs, 3 beat PAT. Carotid Dopplers 40-59% left internal carotid, 39% right. History of COPD, long-standing smoking. Dr. Halford Chessman. Chronic dyspnea on exertion no change.  Overall she is feeling well, no significant changes. No chest pain.  Past Medical History  Diagnosis Date  . GERD (gastroesophageal reflux disease)   . Diverticula of colon   . CAD (coronary artery disease)     Mild per remote cath in 2006, /    Nuclear, January, 2013, no significant abnormality,  . Hyperlipidemia   . Pulmonary hypertension, secondary ([place])   . Leg edema     occasional swelling  . Goiter     hx of  . Obesity   . Diabetes mellitus, type 2 ([place])   . Hypothyroidism   . Left ventricular hypertrophy   . Carotid artery disease ([place])     Doppler, January, 2013, 0 [id number] RIC A., [id number] LICA, stable  . Dizziness     occasional  . COPD (chronic obstructive pulmonary disease) ([place])   . Headache(784.0)     migraine  . Arthritis   . Complication of anesthesia     wakes up "mean"  . OSA (obstructive sleep apnea)     cpap setting of 12  . PONV (postoperative nausea and vomiting)     severe after every surgery  . Ejection fraction     Past Surgical History  Procedure Laterality Date  . Thyroidectomy  2011  . Back surgery  1990's    lower back  . Total knee arthroplasty  07/27/2011    Procedure: TOTAL KNEE ARTHROPLASTY;  Surgeon: Mauri Pole, MD;  Location: [facility];   Service: Orthopedics;  Laterality: Left;  . Abdominal hysterectomy    . Anterior cervical decomp/discectomy fusion N/A 11/07/2014    Procedure: Anterior Cervical diskectomy and fusion Cervical four-five, Cervical five-six, Cervical six-seven;  Surgeon: Kary Kos, MD;  Location: [facility];  Service: Neurosurgery;  Laterality: N/A;    Current Medications: Outpatient Prescriptions Prior to Visit  Medication Sig Dispense Refill  . acetaminophen (TYLENOL) 500 MG tablet Take 1,000 mg by mouth every 8 (eight) hours as needed for mild pain or moderate pain.    Marland Kitchen albuterol (PROAIR HFA) 108 (90 BASE) MCG/ACT inhaler Inhale 2 puffs into the lungs every 6 (six) hours as needed for wheezing or shortness of breath. 3 Inhaler 3  . albuterol (PROVENTIL) (2.5 MG/3ML) 0.083% nebulizer solution Take 2.5 mg by nebulization every 6 (six) hours as needed for wheezing or shortness of breath.    Marland Kitchen amLODipine (NORVASC) 5 MG tablet Take 5 mg by mouth daily. Once a day    . aspirin 81 MG tablet Take 81 mg by mouth daily.    . budesonide-formoterol (SYMBICORT) 160-4.5 MCG/ACT inhaler Inhale 2 puffs into the lungs 2 (two) times daily. 3 Inhaler 3  . Cholecalciferol (D3-1000 PO) Take 1 tablet by mouth daily.    . Cyanocobalamin (B-12 PO) Take  1 tablet by mouth daily.    Marland Kitchen gabapentin (NEURONTIN) 600 MG tablet Take 600 mg by mouth 3 (three) times daily.     Marland Kitchen HYDROcodone-acetaminophen (NORCO/VICODIN) 5-325 MG tablet Take 1 tablet by mouth as needed for moderate pain.    Marland Kitchen insulin glargine (LANTUS) 100 UNIT/ML injection Inject 100 Units into the skin 2 (two) times daily.     Marland Kitchen levothyroxine (SYNTHROID, LEVOTHROID) 175 MCG tablet Take 175 mcg by mouth See admin instructions. Alternates with 200 mcg every other day    . levothyroxine (SYNTHROID, LEVOTHROID) 200 MCG tablet Take 200 mcg by mouth See admin instructions. Alternates with 175 mcg every other day    . LORazepam (ATIVAN) 2 MG tablet Take 2 mg by mouth daily as needed.  FOR DEPRESSION    . meclizine (ANTIVERT) 25 MG tablet Take 25 mg by mouth 2 (two) times daily as needed for dizziness.     . metFORMIN (GLUCOPHAGE) 500 MG tablet Take 1,000 mg by mouth 2 (two) times daily with a meal.    . metoprolol tartrate (LOPRESSOR) 25 MG tablet Take 25 mg by mouth 2 (two) times daily.    Mable Fill (EYE ALLERGY RELIEF OP) Apply 1-2 drops to eye 2 (two) times daily as needed (allergy eyes).     . nitroGLYCERIN (NITROSTAT) 0.4 MG SL tablet Place 1 tablet (0.4 mg total) under the tongue every 5 (five) minutes as needed for chest pain. 25 tablet 3  . omeprazole (PRILOSEC) 20 MG capsule Take 20 mg by mouth daily.    . ondansetron (ZOFRAN ODT) 4 MG disintegrating tablet 4mg  ODT q4 hours prn nausea/vomit 10 tablet 0  . PHENADOZ 25 MG suppository Place 2.5 mg rectally daily as needed. (FOR HEADACHES)    . simvastatin (ZOCOR) 20 MG tablet Take 1 tablet by mouth daily.    . traMADol (ULTRAM) 50 MG tablet Take 50 mg by mouth every 12 (twelve) hours as needed for moderate pain.    . valsartan-hydrochlorothiazide (DIOVAN-HCT) 320-25 MG per tablet Take 1 tablet by mouth daily.    . fluticasone (FLONASE) 50 MCG/ACT nasal spray USE 2 SPRAYS NASALLY DAILY 48 g 3  . spironolactone (ALDACTONE) 50 MG tablet Take 50 mg by mouth as needed (swelling). As needed    . ZETIA 10 MG tablet Take 10 mg by mouth daily.      No facility-administered medications prior to visit.     Allergies:   Review of patient's allergies indicates no known allergies.   Social History   Social History  . Marital Status: Married    Spouse Name: Jeneen Rinks  . Number of Children: 3  . Years of Education: 12   Occupational History  . retired    Social History Main Topics  . Smoking status: Former Smoker -- 1.50 packs/day for 34 years    Types: Cigarettes    Quit date: 05/04/1997  . Smokeless tobacco: Never Used  . Alcohol Use: No  . Drug Use: No  . Sexual Activity: Not Asked   Other Topics  Concern  . None   Social History Narrative   Patient lives at home with her husband Jeneen Rinks .   Retired.   Education 12 th grade.   Right handed   Caffeine two cups of coffee.     Family History:  The patient's family history includes Heart Problems in her father and mother.   ROS:   Please see the history of present illness.   Positive peripheral neuropathy, feet.  Neck pain. Improved after surgery. ROS All other systems reviewed and are negative.   PHYSICAL EXAM:   VS:  BP 132/80 mmHg  Pulse 57  Ht 5\' 6"  (1.676 m)  Wt 236 lb (107.049 kg)  BMI 38.11 kg/m2   GEN: Well nourished, well developed, in no acute distress HEENT: normalCervical neck scar. Neck: no JVD, carotid bruits, or masses Cardiac: RRR; no murmurs, rubs, or gallops,no edema  Respiratory:  clear to auscultation bilaterally, normal work of breathing GI: soft, nontender, nondistended, + BS, obese MS: no deformity or atrophy Skin: warm and dry, no rash, upper arm varicosities noted. Neuro:  Alert and Oriented x 3, Strength and sensation are intact Psych: euthymic mood, full affect  Wt Readings from Last 3 Encounters:  08/22/15 236 lb (107.049 kg)  04/16/15 235 lb (106.595 kg)  11/11/14 240 lb (108.863 kg)      Studies/Labs Reviewed:   EKG:  EKG is ordered today.  The ekg ordered today demonstrates 08/22/15-sinus bradycardia with first-degree AV block of 216 ms, no other abnormalities. Personally viewed.  Recent Labs: 11/11/2014: BUN 18; Creatinine, Ser 1.36*; Hemoglobin 11.7*; Platelets 412*; Potassium 3.8; Sodium 135   Lipid Panel No results found for: CHOL, TRIG, HDL, CHOLHDL, VLDL, LDLCALC, LDLDIRECT  Additional studies/ records that were reviewed today include:  Former office notes, echocardiogram, carotid Dopplers reviewed     ASSESSMENT:    1. Coronary artery disease involving native coronary artery of native heart without angina pectoris   2. Essential hypertension   3. Atherosclerosis of  native coronary artery of native heart without angina pectoris   4. Bilateral carotid artery stenosis   5. Palpitations   6. Bilateral carotid artery disease (HCC)      PLAN:  In order of problems listed above:  Nonobstructive coronary artery disease -Continue with current medical management, optimize diabetes management, statin, blood pressure control. Overall she is having no anginal symptoms. Stress test in 2013 was low risk, reassuring. Normal ejection fraction.  Palpitations -PVCs/PACs seen on prior event monitor. Continue with metoprolol. No changes.  Diabetes with peripheral neuropathy -Gabapentin. Overall doing moderately well. Does not appear to be claudication.  Morbid obesity -Continue to encourage weight loss  Carotid artery disease -Carotid Dopplers from 08/2015 overall in the mild plaque range bilaterally. We will follow-up with carotid Dopplers on an as-needed basis at this point. I do not appreciate any significant bruits on exam.  Essential hypertension -Currently well controlled medications reviewed.  Chronic kidney disease stage III -Dr. Lisbeth Ply is watching creatinine closely especially with her spironolactone.  COPD -Dr.Sood one-year follow-up.    Medication Adjustments/Labs and Tests Ordered: Current medicines are reviewed at length with the patient today.  Concerns regarding medicines are outlined above.  Medication changes, Labs and Tests ordered today are listed in the Patient Instructions below. Patient Instructions  Medication Instructions:  Your physician recommends that you continue on your current medications as directed. Please refer to the Current Medication list given to you today.   Labwork: None ordered  Testing/Procedures: None ordered  Follow-Up: Your physician wants you to follow-up in: 12 month with Dr.Johanna Matto You will receive a reminder letter in the mail two months in advance. If you don't receive a letter, please call our office  to schedule the follow-up appointment.   Any Other Special Instructions Will Be Listed Below (If Applicable).     If you need a refill on your cardiac medications before your next appointment, please call your pharmacy.  Bobby Rumpf, MD  08/22/2015 12:05 PM    [facility] [address], [place], [place]  [address] Phone: [phone]; Fax: [phone]

## 2015-10-22 ENCOUNTER — Encounter: Payer: Self-pay | Admitting: Nurse Practitioner

## 2015-10-22 ENCOUNTER — Other Ambulatory Visit: Payer: Self-pay | Admitting: Nurse Practitioner

## 2015-10-22 ENCOUNTER — Ambulatory Visit (INDEPENDENT_AMBULATORY_CARE_PROVIDER_SITE_OTHER): Payer: Commercial Managed Care - HMO | Admitting: Nurse Practitioner

## 2015-10-22 VITALS — BP 120/62 | HR 58 | Ht 66.0 in | Wt 232.1 lb

## 2015-10-22 DIAGNOSIS — R0789 Other chest pain: Secondary | ICD-10-CM

## 2015-10-22 DIAGNOSIS — I2510 Atherosclerotic heart disease of native coronary artery without angina pectoris: Secondary | ICD-10-CM | POA: Diagnosis not present

## 2015-10-22 DIAGNOSIS — R55 Syncope and collapse: Secondary | ICD-10-CM

## 2015-10-22 DIAGNOSIS — R001 Bradycardia, unspecified: Secondary | ICD-10-CM

## 2015-10-22 DIAGNOSIS — R002 Palpitations: Secondary | ICD-10-CM

## 2015-10-22 DIAGNOSIS — I10 Essential (primary) hypertension: Secondary | ICD-10-CM

## 2015-10-22 DIAGNOSIS — R42 Dizziness and giddiness: Secondary | ICD-10-CM

## 2015-10-22 NOTE — Patient Instructions (Addendum)
We will be checking the following labs today - NONE   Medication Instructions:    Continue with your current medicines.     Testing/Procedures To Be Arranged:  Cardiac event monitor - ? Put on today  2 day Lexiscan Myoview  Follow-Up:   We will see how your tests turn out and then decide about your follow up.    Other Special Instructions:   N/A    If you need a refill on your cardiac medications before your next appointment, please call your pharmacy.   Call the [place] office at [phone] if you have any questions, problems or concerns.

## 2015-10-22 NOTE — Progress Notes (Signed)
CARDIOLOGY OFFICE NOTE  Date:  10/22/2015    Kayla [name] Date of Birth: March 29, 1941 Medical Record [id number]  PCP:  [name], MD  Cardiologist:  [facility]    Chief Complaint  Patient presents with  . Bradycardia    Work in visit - seen for Dr. Marlou Porch    History of Present Illness: Kayla [name] is a 75 y.o. female who presents today for a work in visit. Seen for Dr. Marlou Porch - former patient of Dr. Kae Heller.   Just seen back here in April. Has nonobstructive CAD per remote cath from 2006, low risk Myoview in 2013, PACs and PVCs noted on Holter in 2015, carotid disease, COPD, long standing tobacco abuse and chronic DOE. Other issues include DM, HLD, GERD, obesity and hypothyroidism.  Comes in today. Here with her husband. She notes that she has had multiple spells of low heart rate that are associated with dizziness and near syncope. No frank syncope. Does not happen every day. Will have several spells in a week's time. Feels bad. Blurred vision. Spells can last up to about 3 hours. Metoprolol was stopped by her PCP over 3 weeks ago - spells have persisted. She is scared she will pass out. She also endorses chest discomfort with exertion - has had for several months - says she forgot to mention this at her last visit. Last A1C was 9. Says her blood sugar has improved with more medicine. No regular activity. Chronic DOE.   Past Medical History  Diagnosis Date  . GERD (gastroesophageal reflux disease)   . Diverticula of colon   . CAD (coronary artery disease)     Mild per remote cath in 2006, /    Nuclear, January, 2013, no significant abnormality,  . Hyperlipidemia   . Pulmonary hypertension, secondary ([place])   . Leg edema     occasional swelling  . Goiter     hx of  . Obesity   . Diabetes mellitus, type 2 ([place])   . Hypothyroidism   . Left ventricular hypertrophy   . Carotid artery disease ([place])     Doppler, January, 2013, 0 [id number] RIC A., [id number] LICA, stable  .  Dizziness     occasional  . COPD (chronic obstructive pulmonary disease) ([place])   . Headache(784.0)     migraine  . Arthritis   . Complication of anesthesia     wakes up "mean"  . OSA (obstructive sleep apnea)     cpap setting of 12  . PONV (postoperative nausea and vomiting)     severe after every surgery  . Ejection fraction     Past Surgical History  Procedure Laterality Date  . Thyroidectomy  2011  . Back surgery  1990's    lower back  . Total knee arthroplasty  07/27/2011    Procedure: TOTAL KNEE ARTHROPLASTY;  Surgeon: Mauri Pole, MD;  Location: [facility];  Service: Orthopedics;  Laterality: Left;  . Abdominal hysterectomy    . Anterior cervical decomp/discectomy fusion N/A 11/07/2014    Procedure: Anterior Cervical diskectomy and fusion Cervical four-five, Cervical five-six, Cervical six-seven;  Surgeon: Kary Kos, MD;  Location: [facility];  Service: Neurosurgery;  Laterality: N/A;     Medications: Current Outpatient Prescriptions  Medication Sig Dispense Refill  . acetaminophen (TYLENOL) 500 MG tablet Take 1,000 mg by mouth every 8 (eight) hours as needed for mild pain or moderate pain.    Marland Kitchen albuterol (PROAIR HFA) 108 (90 BASE) MCG/ACT  inhaler Inhale 2 puffs into the lungs every 6 (six) hours as needed for wheezing or shortness of breath. 3 Inhaler 3  . albuterol (PROVENTIL) (2.5 MG/3ML) 0.083% nebulizer solution Take 2.5 mg by nebulization every 6 (six) hours as needed for wheezing or shortness of breath.    Marland Kitchen amLODipine (NORVASC) 5 MG tablet Take 5 mg by mouth daily. Once a day    . aspirin 81 MG tablet Take 81 mg by mouth daily.    . budesonide-formoterol (SYMBICORT) 160-4.5 MCG/ACT inhaler Inhale 2 puffs into the lungs 2 (two) times daily. 3 Inhaler 3  . Cholecalciferol (D3-1000 PO) Take 1 tablet by mouth daily.    . Cyanocobalamin (B-12 PO) Take 1 tablet by mouth daily.    Marland Kitchen gabapentin (NEURONTIN) 600 MG tablet Take 600 mg by mouth 3 (three) times daily.     Marland Kitchen  HYDROcodone-acetaminophen (NORCO/VICODIN) 5-325 MG tablet Take 1 tablet by mouth as needed for moderate pain.    Marland Kitchen insulin glargine (LANTUS) 100 UNIT/ML injection Inject 100 Units into the skin 2 (two) times daily.     Marland Kitchen levothyroxine (SYNTHROID, LEVOTHROID) 175 MCG tablet Take 175 mcg by mouth See admin instructions. Alternates with 200 mcg every other day    . levothyroxine (SYNTHROID, LEVOTHROID) 200 MCG tablet Take 200 mcg by mouth See admin instructions. Alternates with 175 mcg every other day    . LORazepam (ATIVAN) 2 MG tablet Take 2 mg by mouth daily as needed. FOR DEPRESSION    . meclizine (ANTIVERT) 25 MG tablet Take 25 mg by mouth 2 (two) times daily as needed for dizziness.     . metFORMIN (GLUCOPHAGE) 500 MG tablet Take 1,000 mg by mouth 2 (two) times daily with a meal.    . Naphazoline-Pheniramine (EYE ALLERGY RELIEF OP) Apply 1-2 drops to eye 2 (two) times daily as needed (allergy eyes).     . nitroGLYCERIN (NITROSTAT) 0.4 MG SL tablet Place 1 tablet (0.4 mg total) under the tongue every 5 (five) minutes as needed for chest pain. 25 tablet 3  . omeprazole (PRILOSEC) 20 MG capsule Take 20 mg by mouth daily.    Marland Kitchen PHENADOZ 25 MG suppository Place 2.5 mg rectally daily as needed. (FOR HEADACHES)    . simvastatin (ZOCOR) 20 MG tablet Take 1 tablet by mouth daily.    Marland Kitchen spironolactone (ALDACTONE) 50 MG tablet Take 50 mg by mouth daily.    . traMADol (ULTRAM) 50 MG tablet Take 50 mg by mouth every 12 (twelve) hours as needed for moderate pain.    . valsartan-hydrochlorothiazide (DIOVAN-HCT) 320-25 MG per tablet Take 1 tablet by mouth daily.     No current facility-administered medications for this visit.    Allergies: No Known Allergies  Social History: The patient  reports that she quit smoking about 18 years ago. Her smoking use included Cigarettes. She has a 51 pack-year smoking history. She has never used smokeless tobacco. She reports that she does not drink alcohol or use illicit  drugs.   Family History: The patient's family history includes Heart Problems in her father and mother.   Review of Systems: Please see the history of present illness.   Otherwise, the review of systems is positive for none.   All other systems are reviewed and negative.   Physical Exam: VS:  BP 120/62 mmHg  Pulse 58  Ht 5\' 6"  (1.676 m)  Wt 232 lb 1.9 oz (105.289 kg)  BMI 37.48 kg/m2  SpO2 96% .  BMI  Body mass index is 37.48 kg/(m^2).  Wt Readings from Last 3 Encounters:  10/22/15 232 lb 1.9 oz (105.289 kg)  08/22/15 236 lb (107.049 kg)  04/16/15 235 lb (106.595 kg)    General:  Obese white female who is alert and in no acute distress. Little short of breath with walking in to the office today.  HEENT: Normal but with some missing teeth. Neck: Supple, no JVD, carotid bruits, or masses noted.  Cardiac: Regular rate and rhythm. Heart tones are distant.  No edema.  Respiratory:  Decreased breath sounds and with mild increased work of breathing.  GI: Soft and nontender. Obese.  MS: No deformity or atrophy. Gait and ROM intact. Skin: Warm and dry. Color is normal.  Neuro:  Strength and sensation are intact and no gross focal deficits noted.  Psych: Alert, appropriate and with normal affect.   LABORATORY DATA:  EKG:  EKG is ordered today. This demonstrates NSR with lateral T wave changes - unchanged. Her rate is 63 today.  Lab Results  Component Value Date   WBC 12.0* 11/11/2014   HGB 11.7* 11/11/2014   HCT 35.7* 11/11/2014   PLT 412* 11/11/2014   GLUCOSE 136* 11/11/2014   NA 135 11/11/2014   K 3.8 11/11/2014   CL 95* 11/11/2014   CREATININE 1.36* 11/11/2014   BUN 18 11/11/2014   CO2 26 11/11/2014   TSH 48.02* 06/04/2008   INR 0.93 07/22/2011   HGBA1C 8.9* 10/29/2014    BNP (last 3 results) No results for input(s): BNP in the last 8760 hours.  ProBNP (last 3 results) No results for input(s): PROBNP in the last 8760 hours.   Other Studies Reviewed  Today:  Echo Study Conclusions from 2015  - Left ventricle: The cavity size was normal. Wall thickness was increased in a pattern of mild LVH. There was mild focal basal hypertrophy of the septum. Systolic function was normal. The estimated ejection fraction was in the range of 55% to 60%. Wall motion was normal; there were no regional wall motion abnormalities. Doppler parameters are consistent with abnormal left ventricular relaxation (grade 1 diastolic dysfunction). - Left atrium: The atrium was mildly dilated. - Atrial septum: There was increased thickness of the septum, consistent with lipomatous hypertrophy.  Assessment/Plan:  Presyncope in the setting of low HR - will place event monitor. Already off Metoprolol. Further disposition to follow.    Nonobstructive coronary artery disease -has been having exertional chest pain. Several months duration. Will get her Myoview updated - she has multiple risk factors to include uncontrolled DM, HTN, obesity, COPD, etc. Will check 2 day Lexiscan.   Palpitations -PVCs/PACs seen on prior event monitor. Now off Metoprolol. Will now place event monitor.   Diabetes with peripheral neuropathy -Gabapentin. Overall doing moderately well. Does not appear to be claudication.  Morbid obesity -Continue to encourage weight loss  Carotid artery disease -Carotid Dopplers from 08/2015 overall in the mild plaque range bilaterally. Per Dr. Marlou Porch - to have follow-up carotid Dopplers on an as-needed basis at this point.  Essential hypertension -Fair control - further disposition to follow  Chronic kidney disease stage III -Dr. Lisbeth Ply apparently watching creatinine closely especially with her spironolactone.  COPD -follows with pulmonary  Current medicines are reviewed with the patient today.  The patient does not have concerns regarding medicines other than what has been noted above.  The following changes have been made:  See  above.  Labs/ tests ordered today include:    Orders Placed This  Encounter  Procedures  . Myocardial Perfusion Imaging  . Cardiac event monitor  . EKG 12-Lead     Disposition:   Further disposition to follow.  Patient is agreeable to this plan and will call if any problems develop in the interim.   Signed: Burtis Junes, RN, ANP-C 10/22/2015 8:16 AM  [facility] [address] [place], [place]  [address] Phone: [phone] Fax: [phone]

## 2015-10-23 ENCOUNTER — Ambulatory Visit (INDEPENDENT_AMBULATORY_CARE_PROVIDER_SITE_OTHER): Payer: Commercial Managed Care - HMO

## 2015-10-23 DIAGNOSIS — R002 Palpitations: Secondary | ICD-10-CM

## 2015-10-23 DIAGNOSIS — R55 Syncope and collapse: Secondary | ICD-10-CM

## 2015-10-23 DIAGNOSIS — R001 Bradycardia, unspecified: Secondary | ICD-10-CM

## 2015-10-23 DIAGNOSIS — R0789 Other chest pain: Secondary | ICD-10-CM

## 2015-10-23 DIAGNOSIS — R42 Dizziness and giddiness: Secondary | ICD-10-CM | POA: Diagnosis not present

## 2015-10-28 ENCOUNTER — Telehealth: Payer: Self-pay | Admitting: Nurse Practitioner

## 2015-10-28 NOTE — Telephone Encounter (Signed)
Refer to electrophysiology for pacemaker implantation. Junctional escape in the 40s. Asymptomatic. There appears to be underlying P-wave conduction without associated QRS complexes occasionally.  She is off of metoprolol during this monitor.  Candee Furbish, MD

## 2015-10-28 NOTE — Telephone Encounter (Signed)
appt scheduled for pt to see Dr Olin Pia tomorrow at 3:30 pm. Pt is aware and will be here at 3:15 pm to check in.

## 2015-10-28 NOTE — Telephone Encounter (Signed)
I reviewed with Shelly in our monitor room.  Monitor tracings have been stable.  Strips will be printed for Dr. Marlou Porch to review.

## 2015-10-28 NOTE — Telephone Encounter (Signed)
New Message:   Please call,questions about her readings on her event monitor.

## 2015-10-28 NOTE — Telephone Encounter (Signed)
I spoke with pt. She is calling regarding monitor she is wearing. She reports her heart rate has been in the 40's and she is having dizziness.  She states that she has pushed the monitor button several times but no one has been in touch with her.  She is asking what monitor shows.  I explained to pt that I have not seen the monitor results but I would check on this.  I told her we would call her back.  Symptoms of dizziness and low heart rate are the same symptoms she had when she saw Truitt Merle, NP

## 2015-10-29 ENCOUNTER — Encounter: Payer: Self-pay | Admitting: *Deleted

## 2015-10-29 ENCOUNTER — Encounter: Payer: Self-pay | Admitting: Internal Medicine

## 2015-10-29 ENCOUNTER — Ambulatory Visit (INDEPENDENT_AMBULATORY_CARE_PROVIDER_SITE_OTHER): Payer: Commercial Managed Care - HMO | Admitting: Internal Medicine

## 2015-10-29 VITALS — BP 136/66 | HR 72 | Ht 66.0 in | Wt 231.2 lb

## 2015-10-29 DIAGNOSIS — R55 Syncope and collapse: Secondary | ICD-10-CM | POA: Diagnosis not present

## 2015-10-29 DIAGNOSIS — R001 Bradycardia, unspecified: Secondary | ICD-10-CM | POA: Diagnosis not present

## 2015-10-29 DIAGNOSIS — Z01812 Encounter for preprocedural laboratory examination: Secondary | ICD-10-CM

## 2015-10-29 DIAGNOSIS — R002 Palpitations: Secondary | ICD-10-CM | POA: Diagnosis not present

## 2015-10-29 LAB — CBC WITH DIFFERENTIAL/PLATELET
Basophils Absolute: 99 cells/uL (ref 0–200)
Basophils Relative: 1 %
Eosinophils Absolute: 198 cells/uL (ref 15–500)
Eosinophils Relative: 2 %
HCT: 37 % (ref 35.0–45.0)
Hemoglobin: 12.1 g/dL (ref 11.7–15.5)
Lymphocytes Relative: 30 %
Lymphs Abs: 2970 cells/uL (ref 850–3900)
MCH: 30.2 pg (ref 27.0–33.0)
MCHC: 32.7 g/dL (ref 32.0–36.0)
MCV: 92.3 fL (ref 80.0–100.0)
MPV: 9.7 fL (ref 7.5–12.5)
Monocytes Absolute: 693 cells/uL (ref 200–950)
Monocytes Relative: 7 %
Neutro Abs: 5940 cells/uL (ref 1500–7800)
Neutrophils Relative %: 60 %
Platelets: 412 10*3/uL — ABNORMAL HIGH (ref 140–400)
RBC: 4.01 MIL/uL (ref 3.80–5.10)
RDW: 13.5 % (ref 11.0–15.0)
WBC: 9.9 10*3/uL (ref 3.8–10.8)

## 2015-10-29 LAB — PROTIME-INR
INR: 1
Prothrombin Time: 10.4 s (ref 9.0–11.5)

## 2015-10-29 NOTE — Patient Instructions (Signed)
Medication Instructions: - Your physician recommends that you continue on your current medications as directed. Please refer to the Current Medication list given to you today.  Labwork: - Your physician recommends that you have lab work today: BMP/ CBC/ INR  Procedures/Testing: - Your physician has recommended that you have a pacemaker inserted. A pacemaker is a small device that is placed under the skin of your chest or abdomen to help control abnormal heart rhythms. This device uses electrical pulses to prompt the heart to beat at a normal rate. Pacemakers are used to treat heart rhythms that are too slow. Wire (leads) are attached to the pacemaker that goes into the chambers of you heart. This is done in the hospital and usually requires and overnight stay. Please see the instruction sheet given to you today for more information.  Follow-Up: - Your physician recommends that you schedule a follow-up appointment in: 10-14 days (from 11/08/15) for a wound check with the device clinic.   Any Additional Special Instructions Will Be Listed Below (If Applicable).     If you need a refill on your cardiac medications before your next appointment, please call your pharmacy.

## 2015-10-29 NOTE — Progress Notes (Signed)
ELECTROPHYSIOLOGY CONSULT NOTE  Patient ID: Kayla [name], MRN: [id number], DOB/AGE: 01/08/1941 75 y.o. Admit date: (Not on file) Date of Consult: 10/29/2015  Primary Physician: [name], MD Primary Cardiologist: *MS  Consulting Physician MS/LG  Chief Complaint: presyncope   HPI Kayla [name] is a 75 y.o. female  Referred for consideration of a pacemaker.  She has noted over the last 3 years problems with slow heart rates. It was initially ascribed to metoprolol and improved temporarily with his down titration recurring with its re-up titration and then persisting despite its discontinuation. Notably also, there was a time where her heart rate went down with exercise.  She is recorded at home heart rates in the 40s and 30s. These are associated with shortness of breath chest tightness and presyncope. She also has slow heart rates associated with heartbeats and a discomfort in her throat.  Because of the above, she was given an event recorder which demonstrates junctional rhythms in the high 30s and low 40s associated with symptoms of dizziness  She has a history of nonobstructive coronary artery disease by catheterization 2006 with a low risk Myoview 2013.  Echocardiogram 2015 normal LV function mild left atrial enlargement     Past Medical History  Diagnosis Date  . GERD (gastroesophageal reflux disease)   . Diverticula of colon   . CAD (coronary artery disease)     Mild per remote cath in 2006, /    Nuclear, January, 2013, no significant abnormality,  . Hyperlipidemia   . Pulmonary hypertension, secondary ([place])   . Leg edema     occasional swelling  . Goiter     hx of  . Obesity   . Diabetes mellitus, type 2 ([place])   . Hypothyroidism   . Left ventricular hypertrophy   . Carotid artery disease ([place])     Doppler, January, 2013, 0 [id number] RIC A., [id number] LICA, stable  . Dizziness     occasional  . COPD (chronic obstructive pulmonary disease) ([place])   .  Headache(784.0)     migraine  . Arthritis   . Complication of anesthesia     wakes up "mean"  . OSA (obstructive sleep apnea)     cpap setting of 12  . PONV (postoperative nausea and vomiting)     severe after every surgery  . Ejection fraction       Surgical History:  Past Surgical History  Procedure Laterality Date  . Thyroidectomy  2011  . Back surgery  1990's    lower back  . Total knee arthroplasty  07/27/2011    Procedure: TOTAL KNEE ARTHROPLASTY;  Surgeon: Mauri Pole, MD;  Location: [facility];  Service: Orthopedics;  Laterality: Left;  . Abdominal hysterectomy    . Anterior cervical decomp/discectomy fusion N/A 11/07/2014    Procedure: Anterior Cervical diskectomy and fusion Cervical four-five, Cervical five-six, Cervical six-seven;  Surgeon: Kary Kos, MD;  Location: [facility];  Service: Neurosurgery;  Laterality: N/A;     Home Meds: Prior to Admission medications   Medication Sig Start Date End Date Taking? Authorizing Provider  acetaminophen (TYLENOL) 500 MG tablet Take 1,000 mg by mouth every 8 (eight) hours as needed for mild pain or moderate pain.   Yes Historical Provider, MD  albuterol (PROAIR HFA) 108 (90 BASE) MCG/ACT inhaler Inhale 2 puffs into the lungs every 6 (six) hours as needed for wheezing or shortness of breath. 04/16/15  Yes Chesley Mires, MD  albuterol (PROVENTIL) (2.5  MG/3ML) 0.083% nebulizer solution Take 2.5 mg by nebulization every 6 (six) hours as needed for wheezing or shortness of breath.   Yes Historical Provider, MD  amLODipine (NORVASC) 5 MG tablet Take 5 mg by mouth daily. Once a day 08/13/11  Yes Historical Provider, MD  aspirin 81 MG tablet Take 81 mg by mouth daily.   Yes Historical Provider, MD  budesonide-formoterol (SYMBICORT) 160-4.5 MCG/ACT inhaler Inhale 2 puffs into the lungs 2 (two) times daily. 04/16/15  Yes Chesley Mires, MD  Cholecalciferol (D3-1000 PO) Take 1 tablet by mouth daily.   Yes Historical Provider, MD  Cyanocobalamin (B-12  PO) Take 1 tablet by mouth daily.   Yes Historical Provider, MD  gabapentin (NEURONTIN) 600 MG tablet Take 600 mg by mouth 3 (three) times daily.  03/10/13  Yes Historical Provider, MD  HYDROcodone-acetaminophen (NORCO/VICODIN) 5-325 MG tablet Take 1 tablet by mouth as needed for moderate pain.   Yes Historical Provider, MD  insulin glargine (LANTUS) 100 UNIT/ML injection Inject 100 Units into the skin 2 (two) times daily.    Yes Historical Provider, MD  levothyroxine (SYNTHROID, LEVOTHROID) 175 MCG tablet Take 175 mcg by mouth See admin instructions. Alternates with 200 mcg every other day   Yes Historical Provider, MD  levothyroxine (SYNTHROID, LEVOTHROID) 200 MCG tablet Take 200 mcg by mouth See admin instructions. Alternates with 175 mcg every other day   Yes Historical Provider, MD  LORazepam (ATIVAN) 2 MG tablet Take 2 mg by mouth daily as needed. FOR DEPRESSION 08/20/14  Yes Historical Provider, MD  meclizine (ANTIVERT) 25 MG tablet Take 25 mg by mouth 2 (two) times daily as needed for dizziness.  10/18/13  Yes Historical Provider, MD  metFORMIN (GLUCOPHAGE) 500 MG tablet Take 1,000 mg by mouth 2 (two) times daily with a meal.   Yes Historical Provider, MD  Naphazoline-Pheniramine (EYE ALLERGY RELIEF OP) Apply 1-2 drops to eye 2 (two) times daily as needed (allergy eyes).    Yes Historical Provider, MD  nitroGLYCERIN (NITROSTAT) 0.4 MG SL tablet Place 1 tablet (0.4 mg total) under the tongue every 5 (five) minutes as needed for chest pain. 10/09/14 11/30/17 Yes Imogene Burn, PA-C  omeprazole (PRILOSEC) 20 MG capsule Take 20 mg by mouth daily.   Yes Historical Provider, MD  PHENADOZ 25 MG suppository Place 2.5 mg rectally daily as needed. (FOR HEADACHES) 09/26/14  Yes Historical Provider, MD  simvastatin (ZOCOR) 20 MG tablet Take 1 tablet by mouth daily. 12/14/12  Yes Historical Provider, MD  spironolactone (ALDACTONE) 50 MG tablet Take 50 mg by mouth daily. 08/15/15  Yes Historical Provider, MD    traMADol (ULTRAM) 50 MG tablet Take 50 mg by mouth every 12 (twelve) hours as needed for moderate pain.   Yes Historical Provider, MD  valsartan-hydrochlorothiazide (DIOVAN-HCT) 320-25 MG per tablet Take 1 tablet by mouth daily.   Yes Historical Provider, MD    Allergies: No Known Allergies  Social History   Social History  . Marital Status: Married    Spouse Name: Jeneen Rinks  . Number of Children: 3  . Years of Education: 12   Occupational History  . retired    Social History Main Topics  . Smoking status: Former Smoker -- 1.50 packs/day for 34 years    Types: Cigarettes    Quit date: 05/04/1997  . Smokeless tobacco: Never Used  . Alcohol Use: No  . Drug Use: No  . Sexual Activity: Not on file   Other Topics Concern  . Not  on file   Social History Narrative   Patient lives at home with her husband Jeneen Rinks .   Retired.   Education 12 th grade.   Right handed   Caffeine two cups of coffee.     Family History  Problem Relation Age of Onset  . Heart Problems Mother   . Heart Problems Father     ROS:  Please see the history of present illness.     All other systems reviewed and negative.    Physical Exam:   Blood pressure 136/66, pulse 72, height 5\' 6"  (1.676 m), weight 231 lb 3.2 oz (104.872 kg). General: Well developed, well nourished female in no acute distress. Head: Normocephalic, atraumatic, sclera non-icteric, no xanthomas, nares are without discharge. EENT: normal  Lymph Nodes:  none Neck: Negative for carotid bruits. JVD not elevated. Back:without scoliosis kyphosis  Lungs: Clear bilaterally to auscultation without wheezes, rales, or rhonchi. Breathing is unlabored. Heart: slow RRR with S1 S2. No   murmur . No rubs, or gallops appreciated. Abdomen: Soft, non-tender, non-distended with normoactive bowel sounds. No hepatomegaly. No rebound/guarding. No obvious abdominal masses. Msk:  Strength and tone appear normal for age. Extremities: No clubbing or cyanosis.  No  edema.  Distal pedal pulses are 2+ and equal bilaterally. Skin: Warm and Dry Neuro: Alert and oriented X 3. CN III-XII intact Grossly normal sensory and motor function . Psych:  Responds to questions appropriately with a normal affect.      Labs: Cardiac Enzymes No results for input(s): CKTOTAL, CKMB, TROPONINI in the last 72 hours. CBC Lab Results  Component Value Date   WBC 12.0* 11/11/2014   HGB 11.7* 11/11/2014   HCT 35.7* 11/11/2014   MCV 90.8 11/11/2014   PLT 412* 11/11/2014   PROTIME: No results for input(s): LABPROT, INR in the last 72 hours. Chemistry No results for input(s): NA, K, CL, CO2, BUN, CREATININE, CALCIUM, PROT, BILITOT, ALKPHOS, ALT, AST, GLUCOSE in the last 168 hours.  Invalid input(s): LABALBU Lipids No results found for: CHOL, HDL, LDLCALC, TRIG BNP PRO B NATRIURETIC PEPTIDE (BNP)  Date/Time Value Ref Range Status  11/12/2008 11:26 AM 110.0* 0.0-100.0 pg/mL Final   Thyroid Function Tests: No results for input(s): TSH, T4TOTAL, T3FREE, THYROIDAB in the last 72 hours.  Invalid input(s): FREET3 Miscellaneous No results found for: DDIMER  Radiology/Studies:  No results found.  EKG: 6/20 sinus rhythm at 63 Intervals 22/08/38  Recorder multiple strips demonstrate junctional rates in the low 40s. She has documented rates at home in the 30s. He hasn't been associated repeatedly with dizziness.   Assessment and Plan:  Sinus node dysfunction  PVCs  Presyncope       The patient has symptomatic sinus node dysfunction with junctional escape beats PVCs giving rise to problematic palpitations. These have persisted despite the elimination of A-V nodal blocking agents; hence there is no evidence of secondary causes.  We discussed treatment options which I think are limited to pacing.   The benefits and risks were reviewed including but not limited to death,  perforation, infection, lead dislodgement and device malfunction.  The patient  understands agrees and is willing to proceed.     Virl Axe

## 2015-10-30 ENCOUNTER — Telehealth: Payer: Self-pay | Admitting: Nurse Practitioner

## 2015-10-30 ENCOUNTER — Other Ambulatory Visit (INDEPENDENT_AMBULATORY_CARE_PROVIDER_SITE_OTHER): Payer: Commercial Managed Care - HMO | Admitting: *Deleted

## 2015-10-30 DIAGNOSIS — R001 Bradycardia, unspecified: Secondary | ICD-10-CM | POA: Diagnosis not present

## 2015-10-30 DIAGNOSIS — E875 Hyperkalemia: Secondary | ICD-10-CM

## 2015-10-30 LAB — BASIC METABOLIC PANEL
BUN: 35 mg/dL — ABNORMAL HIGH (ref 7–25)
BUN: 37 mg/dL — ABNORMAL HIGH (ref 7–25)
CO2: 20 mmol/L (ref 20–31)
CO2: 18 mmol/L — ABNORMAL LOW (ref 20–31)
Calcium: 9.8 mg/dL (ref 8.6–10.4)
Calcium: 9.7 mg/dL (ref 8.6–10.4)
Chloride: 105 mmol/L (ref 98–110)
Chloride: 109 mmol/L (ref 98–110)
Creat: 1.39 mg/dL — ABNORMAL HIGH (ref 0.60–0.93)
Creat: 1.49 mg/dL — ABNORMAL HIGH (ref 0.60–0.93)
Glucose, Bld: 131 mg/dL — ABNORMAL HIGH (ref 65–99)
Glucose, Bld: 150 mg/dL — ABNORMAL HIGH (ref 65–99)
Potassium: 6.1 mmol/L — ABNORMAL HIGH (ref 3.5–5.3)
Potassium: 6.1 mmol/L — ABNORMAL HIGH (ref 3.5–5.3)
Sodium: 138 mmol/L (ref 135–146)
Sodium: 138 mmol/L (ref 135–146)

## 2015-10-30 NOTE — Telephone Encounter (Signed)
Blood has been collected for repeat bmet.  Forwarding to Alvis Lemmings, RN for follow-up

## 2015-10-30 NOTE — Telephone Encounter (Signed)
BMET specimen hemolyzed.  I contacted patient for redraw today and she plans to arrive before 4:30 today.

## 2015-11-01 NOTE — Telephone Encounter (Signed)
Notes Recorded by Deboraha Sprang, MD on 10/31/2015 at 5:37 PM Please Inform Patient that labs are abnormal with a repeat high potassium. I have called the patient and left a voicemail that she should stop her spironolactone. She is to call us tomorrow to let us know that she received this. Thanks   Will await a call back from the patient- I also need to let her know that I have had to move her procedure time for 7/7 from 7:30 am to 12:30pm.

## 2015-11-01 NOTE — Telephone Encounter (Signed)
I called and confirmed with the patient that she received Dr. Olin Pia message last night to d/c aldactone. She confirms she received the message. She verbalizes understanding to stop aldactone. She will come in on 7/5 for repeat BMP for pending PPM implant on 11/08/15.  She is also aware that I need to have her come in at 10:30 am for a 12:30 pm case. She is agreeable.

## 2015-11-06 ENCOUNTER — Other Ambulatory Visit (INDEPENDENT_AMBULATORY_CARE_PROVIDER_SITE_OTHER): Payer: Commercial Managed Care - HMO | Admitting: *Deleted

## 2015-11-06 ENCOUNTER — Telehealth: Payer: Self-pay | Admitting: Cardiology

## 2015-11-06 DIAGNOSIS — E875 Hyperkalemia: Secondary | ICD-10-CM

## 2015-11-06 LAB — BASIC METABOLIC PANEL
BUN: 41 mg/dL — ABNORMAL HIGH (ref 7–25)
CO2: 19 mmol/L — ABNORMAL LOW (ref 20–31)
Calcium: 9.8 mg/dL (ref 8.6–10.4)
Chloride: 109 mmol/L (ref 98–110)
Creat: 1.32 mg/dL — ABNORMAL HIGH (ref 0.60–0.93)
Glucose, Bld: 107 mg/dL — ABNORMAL HIGH (ref 65–99)
Potassium: 5.8 mmol/L — ABNORMAL HIGH (ref 3.5–5.3)
Sodium: 139 mmol/L (ref 135–146)

## 2015-11-06 NOTE — Telephone Encounter (Signed)
No changes with plan for pacemaker. Would continue with Dr. Olin Pia recs.   Candee Furbish, MD

## 2015-11-06 NOTE — Telephone Encounter (Signed)
OK holding off on myoview at this time. If CP returns or becomes more severe, we can always reorder.   Candee Furbish, MD

## 2015-11-06 NOTE — Telephone Encounter (Signed)
The patient was in the office today for a repeat BMP due to K+ 6.0 last week. Aldactone was stopped. The patient was inquiring today if she still needed PPM due to HR's that have increased some since aldactone was stopped. I advised the patient that she may have intermittent elevation of her HR's but that the aldactone does not effect her HR's. I advised she continue with planned PPM at this time.  She is scheduled for a myoview next week (7/12 &7/13) per Truitt Merle, NP. She is questioning if she still needs to have this done. I advised I will need to review with Dr. Nat Math prior to cancelling. I will call her back and let her know. She is agreeable.

## 2015-11-06 NOTE — Telephone Encounter (Signed)
Dr. Marlou Porch,  Question for you is do you think she still needs her myoview for exertional chest pain as ordered by Cecille Rubin?  Thanks!

## 2015-11-07 NOTE — Telephone Encounter (Signed)
The patient is aware that we will cancel her stress test per Dr. Marlou Porch.

## 2015-11-08 ENCOUNTER — Encounter (HOSPITAL_COMMUNITY)
Admission: RE | Disposition: A | Discharge status: Home / Self Care | Payer: Self-pay | Source: Ambulatory Visit | Attending: Internal Medicine

## 2015-11-08 ENCOUNTER — Encounter (HOSPITAL_COMMUNITY): Payer: Self-pay | Admitting: Internal Medicine

## 2015-11-08 ENCOUNTER — Ambulatory Visit (HOSPITAL_COMMUNITY)
Admission: RE | Admit: 2015-11-08 | Discharge: 2015-11-09 | Disposition: A | Discharge status: Home / Self Care | Payer: Commercial Managed Care - HMO | Source: Ambulatory Visit | Attending: Internal Medicine | Admitting: Internal Medicine

## 2015-11-08 DIAGNOSIS — Z794 Long term (current) use of insulin: Secondary | ICD-10-CM | POA: Diagnosis not present

## 2015-11-08 DIAGNOSIS — Z96652 Presence of left artificial knee joint: Secondary | ICD-10-CM | POA: Insufficient documentation

## 2015-11-08 DIAGNOSIS — G4733 Obstructive sleep apnea (adult) (pediatric): Secondary | ICD-10-CM | POA: Insufficient documentation

## 2015-11-08 DIAGNOSIS — K219 Gastro-esophageal reflux disease without esophagitis: Secondary | ICD-10-CM | POA: Diagnosis not present

## 2015-11-08 DIAGNOSIS — I272 Other secondary pulmonary hypertension: Secondary | ICD-10-CM | POA: Diagnosis not present

## 2015-11-08 DIAGNOSIS — Z959 Presence of cardiac and vascular implant and graft, unspecified: Secondary | ICD-10-CM

## 2015-11-08 DIAGNOSIS — Z7951 Long term (current) use of inhaled steroids: Secondary | ICD-10-CM | POA: Insufficient documentation

## 2015-11-08 DIAGNOSIS — Z7984 Long term (current) use of oral hypoglycemic drugs: Secondary | ICD-10-CM | POA: Insufficient documentation

## 2015-11-08 DIAGNOSIS — E119 Type 2 diabetes mellitus without complications: Secondary | ICD-10-CM | POA: Insufficient documentation

## 2015-11-08 DIAGNOSIS — R55 Syncope and collapse: Secondary | ICD-10-CM

## 2015-11-08 DIAGNOSIS — Z01812 Encounter for preprocedural laboratory examination: Secondary | ICD-10-CM

## 2015-11-08 DIAGNOSIS — I495 Sick sinus syndrome: Secondary | ICD-10-CM | POA: Diagnosis not present

## 2015-11-08 DIAGNOSIS — G43909 Migraine, unspecified, not intractable, without status migrainosus: Secondary | ICD-10-CM | POA: Diagnosis not present

## 2015-11-08 DIAGNOSIS — E669 Obesity, unspecified: Secondary | ICD-10-CM | POA: Diagnosis not present

## 2015-11-08 DIAGNOSIS — E785 Hyperlipidemia, unspecified: Secondary | ICD-10-CM | POA: Diagnosis not present

## 2015-11-08 DIAGNOSIS — I2510 Atherosclerotic heart disease of native coronary artery without angina pectoris: Secondary | ICD-10-CM | POA: Insufficient documentation

## 2015-11-08 DIAGNOSIS — E039 Hypothyroidism, unspecified: Secondary | ICD-10-CM | POA: Insufficient documentation

## 2015-11-08 DIAGNOSIS — Z7982 Long term (current) use of aspirin: Secondary | ICD-10-CM | POA: Diagnosis not present

## 2015-11-08 DIAGNOSIS — Z8249 Family history of ischemic heart disease and other diseases of the circulatory system: Secondary | ICD-10-CM | POA: Diagnosis not present

## 2015-11-08 DIAGNOSIS — J449 Chronic obstructive pulmonary disease, unspecified: Secondary | ICD-10-CM | POA: Insufficient documentation

## 2015-11-08 DIAGNOSIS — Z87891 Personal history of nicotine dependence: Secondary | ICD-10-CM | POA: Diagnosis not present

## 2015-11-08 DIAGNOSIS — M1990 Unspecified osteoarthritis, unspecified site: Secondary | ICD-10-CM | POA: Diagnosis not present

## 2015-11-08 DIAGNOSIS — R001 Bradycardia, unspecified: Secondary | ICD-10-CM

## 2015-11-08 DIAGNOSIS — Z6836 Body mass index (BMI) 36.0-36.9, adult: Secondary | ICD-10-CM | POA: Diagnosis not present

## 2015-11-08 DIAGNOSIS — Z981 Arthrodesis status: Secondary | ICD-10-CM | POA: Diagnosis not present

## 2015-11-08 DIAGNOSIS — R002 Palpitations: Secondary | ICD-10-CM

## 2015-11-08 HISTORY — DX: Sick sinus syndrome: I49.5

## 2015-11-08 HISTORY — PX: EP IMPLANTABLE DEVICE: SHX172B

## 2015-11-08 LAB — SURGICAL PCR SCREEN
MRSA, PCR: NEGATIVE
Staphylococcus aureus: POSITIVE — AB

## 2015-11-08 LAB — GLUCOSE, CAPILLARY
Glucose-Capillary: 126 mg/dL — ABNORMAL HIGH (ref 65–99)
Glucose-Capillary: 62 mg/dL — ABNORMAL LOW (ref 65–99)
Glucose-Capillary: 133 mg/dL — ABNORMAL HIGH (ref 65–99)
Glucose-Capillary: 183 mg/dL — ABNORMAL HIGH (ref 65–99)

## 2015-11-08 LAB — TSH: TSH: 0.123 u[IU]/mL — ABNORMAL LOW (ref 0.350–4.500)

## 2015-11-08 LAB — POTASSIUM: Potassium: 4.9 mmol/L (ref 3.5–5.1)

## 2015-11-08 SURGERY — PACEMAKER IMPLANT
Anesthesia: LOCAL

## 2015-11-08 MED ORDER — GABAPENTIN 600 MG PO TABS
600.0000 mg | ORAL_TABLET | Freq: Three times a day (TID) | ORAL | Status: DC
  Administered 2015-11-08 (×2): 600 mg via ORAL
  Filled 2015-11-08 (×3): qty 1

## 2015-11-08 MED ORDER — METFORMIN HCL 500 MG PO TABS
1000.0000 mg | ORAL_TABLET | Freq: Two times a day (BID) | ORAL | Status: DC
  Filled 2015-11-08: qty 2

## 2015-11-08 MED ORDER — CEFAZOLIN SODIUM-DEXTROSE 2-4 GM/100ML-% IV SOLN
2.0000 g | Freq: Three times a day (TID) | INTRAVENOUS | Status: AC
  Administered 2015-11-08 – 2015-11-09 (×2): 2 g via INTRAVENOUS
  Filled 2015-11-08 (×2): qty 100

## 2015-11-08 MED ORDER — SODIUM CHLORIDE 0.9 % IV SOLN
INTRAVENOUS | Status: AC
  Administered 2015-11-08: 14:00:00 via INTRAVENOUS

## 2015-11-08 MED ORDER — LIDOCAINE HCL (PF) 1 % IJ SOLN
INTRAMUSCULAR | Status: AC
  Filled 2015-11-08: qty 30

## 2015-11-08 MED ORDER — LEVOTHYROXINE SODIUM 200 MCG PO TABS: 200.0000 ug | ORAL_TABLET | ORAL | Status: DC

## 2015-11-08 MED ORDER — VALSARTAN-HYDROCHLOROTHIAZIDE 320-25 MG PO TABS: 1.0000 | ORAL_TABLET | Freq: Every day | ORAL | Status: DC

## 2015-11-08 MED ORDER — FENTANYL CITRATE (PF) 100 MCG/2ML IJ SOLN
INTRAMUSCULAR | Status: AC
  Filled 2015-11-08: qty 2

## 2015-11-08 MED ORDER — LIDOCAINE HCL (PF) 1 % IJ SOLN
INTRAMUSCULAR | Status: AC
  Filled 2015-11-08: qty 60

## 2015-11-08 MED ORDER — INSULIN GLARGINE 100 UNIT/ML ~~LOC~~ SOLN
100.0000 [IU] | Freq: Two times a day (BID) | SUBCUTANEOUS | Status: DC
  Administered 2015-11-08: 100 [IU] via SUBCUTANEOUS
  Filled 2015-11-08 (×3): qty 1

## 2015-11-08 MED ORDER — DEXTROSE-NACL 5-0.9 % IV SOLN: INTRAVENOUS | Status: DC

## 2015-11-08 MED ORDER — LIDOCAINE HCL (PF) 1 % IJ SOLN
INTRAMUSCULAR | Status: DC | PRN
  Administered 2015-11-08: 35 mL via INTRADERMAL

## 2015-11-08 MED ORDER — MECLIZINE HCL 25 MG PO TABS: 25.0000 mg | ORAL_TABLET | Freq: Two times a day (BID) | ORAL | Status: DC | PRN

## 2015-11-08 MED ORDER — ALBUTEROL SULFATE (2.5 MG/3ML) 0.083% IN NEBU: 2.5000 mg | INHALATION_SOLUTION | Freq: Four times a day (QID) | RESPIRATORY_TRACT | Status: DC | PRN

## 2015-11-08 MED ORDER — FENTANYL CITRATE (PF) 100 MCG/2ML IJ SOLN
INTRAMUSCULAR | Status: DC | PRN
  Administered 2015-11-08 (×4): 25 ug via INTRAVENOUS

## 2015-11-08 MED ORDER — MOMETASONE FURO-FORMOTEROL FUM 200-5 MCG/ACT IN AERO
2.0000 | INHALATION_SPRAY | Freq: Two times a day (BID) | RESPIRATORY_TRACT | Status: DC
  Filled 2015-11-08: qty 8.8

## 2015-11-08 MED ORDER — ASPIRIN 81 MG PO CHEW
81.0000 mg | CHEWABLE_TABLET | Freq: Every day | ORAL | Status: DC
  Administered 2015-11-08: 81 mg via ORAL
  Filled 2015-11-08 (×2): qty 1

## 2015-11-08 MED ORDER — ACETAMINOPHEN 500 MG PO TABS
1000.0000 mg | ORAL_TABLET | Freq: Three times a day (TID) | ORAL | Status: DC | PRN
  Administered 2015-11-08: 1000 mg via ORAL
  Filled 2015-11-08: qty 2

## 2015-11-08 MED ORDER — PANTOPRAZOLE SODIUM 40 MG PO TBEC
40.0000 mg | DELAYED_RELEASE_TABLET | Freq: Every day | ORAL | Status: DC
  Administered 2015-11-08: 40 mg via ORAL
  Filled 2015-11-08 (×2): qty 1

## 2015-11-08 MED ORDER — HEPARIN (PORCINE) IN NACL 2-0.9 UNIT/ML-% IJ SOLN
INTRAMUSCULAR | Status: AC
  Filled 2015-11-08: qty 500

## 2015-11-08 MED ORDER — CHLORHEXIDINE GLUCONATE 4 % EX LIQD: 60.0000 mL | Freq: Once | CUTANEOUS | Status: DC

## 2015-11-08 MED ORDER — HYDROCODONE-ACETAMINOPHEN 5-325 MG PO TABS
1.0000 | ORAL_TABLET | ORAL | Status: DC | PRN
  Administered 2015-11-08: 1 via ORAL
  Filled 2015-11-08: qty 1

## 2015-11-08 MED ORDER — CEFAZOLIN SODIUM-DEXTROSE 2-4 GM/100ML-% IV SOLN
2.0000 g | INTRAVENOUS | Status: AC
  Administered 2015-11-08: 2 g via INTRAVENOUS

## 2015-11-08 MED ORDER — SODIUM CHLORIDE 0.9 % IV SOLN
INTRAVENOUS | Status: DC
  Administered 2015-11-08: 11:00:00 via INTRAVENOUS

## 2015-11-08 MED ORDER — ONDANSETRON HCL 4 MG/2ML IJ SOLN: 4.0000 mg | Freq: Four times a day (QID) | INTRAMUSCULAR | Status: DC | PRN

## 2015-11-08 MED ORDER — MIDAZOLAM HCL 5 MG/5ML IJ SOLN
INTRAMUSCULAR | Status: AC
  Filled 2015-11-08: qty 5

## 2015-11-08 MED ORDER — SIMVASTATIN 20 MG PO TABS
20.0000 mg | ORAL_TABLET | Freq: Every day | ORAL | Status: DC
  Administered 2015-11-08: 20 mg via ORAL
  Filled 2015-11-08: qty 1

## 2015-11-08 MED ORDER — TRAMADOL HCL 50 MG PO TABS: 50.0000 mg | ORAL_TABLET | Freq: Two times a day (BID) | ORAL | Status: DC | PRN

## 2015-11-08 MED ORDER — AMLODIPINE BESYLATE 5 MG PO TABS
5.0000 mg | ORAL_TABLET | Freq: Every day | ORAL | Status: DC
  Filled 2015-11-08: qty 1

## 2015-11-08 MED ORDER — MUPIROCIN 2 % EX OINT
1.0000 "application " | TOPICAL_OINTMENT | Freq: Once | CUTANEOUS | Status: AC
  Administered 2015-11-08: 1 via TOPICAL

## 2015-11-08 MED ORDER — DEXTROSE 50 % IV SOLN
INTRAVENOUS | Status: AC
  Administered 2015-11-08: 25 mL via INTRAVENOUS
  Filled 2015-11-08: qty 50

## 2015-11-08 MED ORDER — HEPARIN (PORCINE) IN NACL 2-0.9 UNIT/ML-% IJ SOLN
INTRAMUSCULAR | Status: DC | PRN
  Administered 2015-11-08: 12:00:00

## 2015-11-08 MED ORDER — LEVOTHYROXINE SODIUM 75 MCG PO TABS
175.0000 ug | ORAL_TABLET | ORAL | Status: DC
  Filled 2015-11-08: qty 1

## 2015-11-08 MED ORDER — DEXTROSE 50 % IV SOLN
25.0000 mL | Freq: Once | INTRAVENOUS | Status: AC
  Administered 2015-11-08: 25 mL via INTRAVENOUS

## 2015-11-08 MED ORDER — SODIUM CHLORIDE 0.9 % IR SOLN
80.0000 mg | Status: AC
  Administered 2015-11-08: 80 mg

## 2015-11-08 MED ORDER — MUPIROCIN 2 % EX OINT
TOPICAL_OINTMENT | CUTANEOUS | Status: AC
  Administered 2015-11-08: 1 via TOPICAL
  Filled 2015-11-08: qty 22

## 2015-11-08 MED ORDER — CEFAZOLIN SODIUM-DEXTROSE 2-4 GM/100ML-% IV SOLN
INTRAVENOUS | Status: AC
  Filled 2015-11-08: qty 100

## 2015-11-08 MED ORDER — ACETAMINOPHEN 325 MG PO TABS: 325.0000 mg | ORAL_TABLET | ORAL | Status: DC | PRN

## 2015-11-08 MED ORDER — IRBESARTAN 150 MG PO TABS
300.0000 mg | ORAL_TABLET | Freq: Every day | ORAL | Status: DC
  Administered 2015-11-08: 300 mg via ORAL
  Filled 2015-11-08 (×2): qty 2

## 2015-11-08 MED ORDER — HYDROCHLOROTHIAZIDE 25 MG PO TABS
25.0000 mg | ORAL_TABLET | Freq: Every day | ORAL | Status: DC
  Administered 2015-11-08: 25 mg via ORAL
  Filled 2015-11-08 (×2): qty 1

## 2015-11-08 MED ORDER — MIDAZOLAM HCL 5 MG/5ML IJ SOLN
INTRAMUSCULAR | Status: DC | PRN
  Administered 2015-11-08: 2 mg via INTRAVENOUS
  Administered 2015-11-08 (×3): 1 mg via INTRAVENOUS

## 2015-11-08 MED ORDER — GENTAMICIN SULFATE 40 MG/ML IJ SOLN
INTRAMUSCULAR | Status: AC
  Filled 2015-11-08: qty 2

## 2015-11-08 MED ORDER — NITROGLYCERIN 0.4 MG SL SUBL: 0.4000 mg | SUBLINGUAL_TABLET | SUBLINGUAL | Status: DC | PRN

## 2015-11-08 SURGICAL SUPPLY — 12 items
CABLE SURGICAL S-101-97-12 (CABLE) ×2 IMPLANT
CATH RIGHTSITE C315HIS02 (CATHETERS) ×2 IMPLANT
HEMOSTAT SURGICEL 2X4 FIBR (HEMOSTASIS) ×2 IMPLANT
LEAD CAPSURE NOVUS 5076-52CM (Lead) ×2 IMPLANT
LEAD SELECT SECURE 3830 383069 (Lead) ×1 IMPLANT
PAD DEFIB LIFELINK (PAD) ×2 IMPLANT
PPM ADVISA MRI DR A2DR01 (Pacemaker) ×2 IMPLANT
SELECT SECURE 3830 383069 (Lead) ×2 IMPLANT
SHEATH CLASSIC 7F (SHEATH) ×4 IMPLANT
SLITTER 6232ADJ (MISCELLANEOUS) ×2 IMPLANT
TRAY PACEMAKER INSERTION (PACKS) ×2 IMPLANT
WIRE HI TORQ VERSACORE-J 145CM (WIRE) ×2 IMPLANT

## 2015-11-08 NOTE — Interval H&P Note (Signed)
History and Physical Interval Note:  11/08/2015 11:00 AM  Kayla [name]  has presented today for surgery, with the diagnosis of junctional bradycardia  The various methods of treatment have been discussed with the patient and family. After consideration of risks, benefits and other options for treatment, the patient has consented to  Procedure(s): Pacemaker Implant (N/A) as a surgical intervention .  The patient's history has been reviewed, patient examined, no change in status, stable for surgery.  I have reviewed the patient's chart and labs.  Questions were answered to the patient's satisfaction.     Virl Axe  TSH pending

## 2015-11-08 NOTE — Discharge Summary (Signed)
ELECTROPHYSIOLOGY PROCEDURE DISCHARGE SUMMARY    Patient ID: Kayla [name],  MRN: [id number], DOB/AGE: 1940-08-06 75 y.o.  Admit date: 11/08/2015 Discharge date: 7/8  Primary Care Physician: [name], MD Primary Cardiologist: Dr. Marlou Porch Electrophysiologist: Dr. Caryl Comes  Primary Discharge Diagnosis:  1. Symptomatic bradycardia status post pacemaker implantation this admission  Secondary Discharge Diagnosis:  1. Hypothyroidism 2. HLD 3. COPD 4. DM  No Known Allergies   Procedures This Admission:  1.  Implantation of a MDT dual chamber PPM on 11/08/15 by Dr Caryl Comes.  Please see procedure note for device details. There were no immediate post procedure complications. 2.  CXR  demonstrated no pneumothorax status post device implantation.   Brief HPI:* Kayla [name] is a 75 y.o. female was referred to electrophysiology in the outpatient setting for consideration of PPM implantation.  Past medical history includes hypothyroidism, HLD, COPD, DM.  The patient has had symptomatic bradycardia without reversible causes identified.   The patient denies chest pain, shortness of breath, nocturnal dyspnea,   Some soreness Hospital Course:  The patient was admitted and underwent implantation of a PPM with details as outlined above.  She was monitored on telemetry overnight which demonstrated P-synchronous/ AV  pacing .  Left chest was  without hematoma or ecchymosis.  The device was interrogated and found to be functioning normally.  CXR was obtained and demonstrated no pneumothorax status post device implantation.     Wound care, arm mobility, and restrictions were reviewed with the patient.    The patient was examined by Dr. Caryl Comes and considered stable for discharge to home.    Physical Exam: Filed Vitals:   11/08/15 1319 11/08/15 1324 11/08/15 1329 11/08/15 1358  BP:    135/73  Pulse: 0 110 0   Temp:      TempSrc:      Resp: 0 0 0 19  Height:      Weight:      SpO2: 0% 0%  0% 98%     GEN- The patient is well appearing, alert and oriented x 3 today.   HEENT: normocephalic, atraumatic; sclera clear, conjunctiva pink; hearing intact; oropharynx clear; neck supple, no JVP Lungs- Clear to ausculation bilaterally, normal work of breathing.  No wheezes, rales, rhonchi Heart- Regular rate and rhythm, no murmurs, rubs or gallops,  Pocket without hematoma, swelling MILD TENDERNESS  GI- soft, non-tender, non-distended, bowel sounds present, no hepatosplenomegaly Extremities- no clubbing, cyanosis, or edema; DP/PT/radial pulses 2+ bilaterally MS- no significant deformity or atrophy Skin- warm and dry, no rash or lesion, left chest without hematoma/ecchymosis Psych- euthymic mood, full affect Neuro- no gross deficits   Labs:   Lab Results  Component Value Date   WBC 9.9 10/29/2015   HGB 12.1 10/29/2015   HCT 37.0 10/29/2015   MCV 92.3 10/29/2015   PLT 412* 10/29/2015    Recent Labs Lab 11/06/15 1006 11/08/15 1050  NA 139  --   K 5.8* 4.9  CL 109  --   CO2 19*  --   BUN 41*  --   CREATININE 1.32*  --   CALCIUM 9.8  --   GLUCOSE 107*  --     Discharge Medications:    Medication List    ASK your doctor about these medications        acetaminophen 500 MG tablet  Commonly known as:  TYLENOL  Take 1,000 mg by mouth every 8 (eight) hours as needed for mild pain or moderate pain.  albuterol (2.5 MG/3ML) 0.083% nebulizer solution  Commonly known as:  PROVENTIL  Take 2.5 mg by nebulization every 6 (six) hours as needed for wheezing or shortness of breath.     albuterol 108 (90 Base) MCG/ACT inhaler  Commonly known as:  PROAIR HFA  Inhale 2 puffs into the lungs every 6 (six) hours as needed for wheezing or shortness of breath.     amLODipine 5 MG tablet  Commonly known as:  NORVASC  Take 5 mg by mouth daily.     aspirin 81 MG tablet  Take 81 mg by mouth daily.     B-12 PO  Take 1 tablet by mouth daily.     budesonide-formoterol 160-4.5  MCG/ACT inhaler  Commonly known as:  SYMBICORT  Inhale 2 puffs into the lungs 2 (two) times daily.     D3-1000 PO  Take 1 tablet by mouth daily.     EYE ALLERGY RELIEF OP  Apply 1-2 drops to eye 2 (two) times daily as needed (allergy eyes).     gabapentin 600 MG tablet  Commonly known as:  NEURONTIN  Take 600 mg by mouth 3 (three) times daily.     HYDROcodone-acetaminophen 5-325 MG tablet  Commonly known as:  NORCO/VICODIN  Take 1 tablet by mouth as needed for moderate pain.     insulin glargine 100 UNIT/ML injection  Commonly known as:  LANTUS  Inject 100 Units into the skin 2 (two) times daily.     levothyroxine 175 MCG tablet  Commonly known as:  SYNTHROID, LEVOTHROID  Take 175 mcg by mouth See admin instructions. Alternates with 200 mcg every other day     levothyroxine 200 MCG tablet  Commonly known as:  SYNTHROID, LEVOTHROID  Take 200 mcg by mouth See admin instructions. Alternates with 175 mcg every other day     LORazepam 2 MG tablet  Commonly known as:  ATIVAN  Take 2 mg by mouth daily as needed. FOR DEPRESSION     meclizine 25 MG tablet  Commonly known as:  ANTIVERT  Take 25 mg by mouth 2 (two) times daily as needed for dizziness.     metFORMIN 500 MG tablet  Commonly known as:  GLUCOPHAGE  Take 1,000 mg by mouth 2 (two) times daily with a meal.     nitroGLYCERIN 0.4 MG SL tablet  Commonly known as:  NITROSTAT  Place 1 tablet (0.4 mg total) under the tongue every 5 (five) minutes as needed for chest pain.     omeprazole 20 MG capsule  Commonly known as:  PRILOSEC  Take 20 mg by mouth daily.     PHENADOZ 25 MG suppository  Generic drug:  promethazine  Place 2.5 mg rectally daily as needed. (FOR HEADACHES)     simvastatin 20 MG tablet  Commonly known as:  ZOCOR  Take 1 tablet by mouth daily.     traMADol 50 MG tablet  Commonly known as:  ULTRAM  Take 50 mg by mouth every 12 (twelve) hours as needed for moderate pain.      valsartan-hydrochlorothiazide 320-25 MG tablet  Commonly known as:  DIOVAN-HCT  Take 1 tablet by mouth daily.        Disposition:  Home      Follow-up Information    [facility] with [facility] On 11/14/2015.   Specialty:  Cardiology   Why:  12:00noon, wound check   Contact information:   [address], [place]  [id number] [phone]      Follow up with Virl Axe, MD On 02/18/2016.   Specialty:  Cardiology   Why:  2:15PM   Contact information:   [address] [address] [phone]       Duration of Discharge Encounter: Greater than 30 minutes including physician time.  hYPERKALEMIA BETTER wound check 7/13  12 noon  NOT TO GO HOME ON VALSARTAN, CAN SEND HOME ON HCTZ 25    Signed, Virl Axe

## 2015-11-08 NOTE — H&P (View-Only) (Signed)
ELECTROPHYSIOLOGY CONSULT NOTE  Patient ID: Kayla [name], MRN: [id number], DOB/AGE: 75-10-1940 75 y.o. Admit date: (Not on file) Date of Consult: 10/29/2015  Primary Physician: [name], MD Primary Cardiologist: *MS  Consulting Physician MS/LG  Chief Complaint: presyncope   HPI Kayla [name] is a 75 y.o. female  Referred for consideration of a pacemaker.  She has noted over the last 3 years problems with slow heart rates. It was initially ascribed to metoprolol and improved temporarily with his down titration recurring with its re-up titration and then persisting despite its discontinuation. Notably also, there was a time where her heart rate went down with exercise.  She is recorded at home heart rates in the 40s and 30s. These are associated with shortness of breath chest tightness and presyncope. She also has slow heart rates associated with heartbeats and a discomfort in her throat.  Because of the above, she was given an event recorder which demonstrates junctional rhythms in the high 30s and low 40s associated with symptoms of dizziness  She has a history of nonobstructive coronary artery disease by catheterization 2006 with a low risk Myoview 2013.  Echocardiogram 2015 normal LV function mild left atrial enlargement     Past Medical History  Diagnosis Date  . GERD (gastroesophageal reflux disease)   . Diverticula of colon   . CAD (coronary artery disease)     Mild per remote cath in 2006, /    Nuclear, January, 2013, no significant abnormality,  . Hyperlipidemia   . Pulmonary hypertension, secondary ([place])   . Leg edema     occasional swelling  . Goiter     hx of  . Obesity   . Diabetes mellitus, type 2 ([place])   . Hypothyroidism   . Left ventricular hypertrophy   . Carotid artery disease ([place])     Doppler, January, 2013, 0 [id number] RIC A., [id number] LICA, stable  . Dizziness     occasional  . COPD (chronic obstructive pulmonary disease) ([place])   .  Headache(784.0)     migraine  . Arthritis   . Complication of anesthesia     wakes up "mean"  . OSA (obstructive sleep apnea)     cpap setting of 12  . PONV (postoperative nausea and vomiting)     severe after every surgery  . Ejection fraction       Surgical History:  Past Surgical History  Procedure Laterality Date  . Thyroidectomy  2011  . Back surgery  1990's    lower back  . Total knee arthroplasty  07/27/2011    Procedure: TOTAL KNEE ARTHROPLASTY;  Surgeon: Mauri Pole, MD;  Location: [facility]S;  Service: Orthopedics;  Laterality: Left;  . Abdominal hysterectomy    . Anterior cervical decomp/discectomy fusion N/A 11/07/2014    Procedure: Anterior Cervical diskectomy and fusion Cervical four-five, Cervical five-six, Cervical six-seven;  Surgeon: Kary Kos, MD;  Location: [facility];  Service: Neurosurgery;  Laterality: N/A;     Home Meds: Prior to Admission medications   Medication Sig Start Date End Date Taking? Authorizing Provider  acetaminophen (TYLENOL) 500 MG tablet Take 1,000 mg by mouth every 8 (eight) hours as needed for mild pain or moderate pain.   Yes Historical Provider, MD  albuterol (PROAIR HFA) 108 (90 BASE) MCG/ACT inhaler Inhale 2 puffs into the lungs every 6 (six) hours as needed for wheezing or shortness of breath. 04/16/15  Yes Chesley Mires, MD  albuterol (PROVENTIL) (2.5  MG/3ML) 0.083% nebulizer solution Take 2.5 mg by nebulization every 6 (six) hours as needed for wheezing or shortness of breath.   Yes Historical Provider, MD  amLODipine (NORVASC) 5 MG tablet Take 5 mg by mouth daily. Once a day 08/13/11  Yes Historical Provider, MD  aspirin 81 MG tablet Take 81 mg by mouth daily.   Yes Historical Provider, MD  budesonide-formoterol (SYMBICORT) 160-4.5 MCG/ACT inhaler Inhale 2 puffs into the lungs 2 (two) times daily. 04/16/15  Yes Chesley Mires, MD  Cholecalciferol (D3-1000 PO) Take 1 tablet by mouth daily.   Yes Historical Provider, MD  Cyanocobalamin (B-12  PO) Take 1 tablet by mouth daily.   Yes Historical Provider, MD  gabapentin (NEURONTIN) 600 MG tablet Take 600 mg by mouth 3 (three) times daily.  03/10/13  Yes Historical Provider, MD  HYDROcodone-acetaminophen (NORCO/VICODIN) 5-325 MG tablet Take 1 tablet by mouth as needed for moderate pain.   Yes Historical Provider, MD  insulin glargine (LANTUS) 100 UNIT/ML injection Inject 100 Units into the skin 2 (two) times daily.    Yes Historical Provider, MD  levothyroxine (SYNTHROID, LEVOTHROID) 175 MCG tablet Take 175 mcg by mouth See admin instructions. Alternates with 200 mcg every other day   Yes Historical Provider, MD  levothyroxine (SYNTHROID, LEVOTHROID) 200 MCG tablet Take 200 mcg by mouth See admin instructions. Alternates with 175 mcg every other day   Yes Historical Provider, MD  LORazepam (ATIVAN) 2 MG tablet Take 2 mg by mouth daily as needed. FOR DEPRESSION 08/20/14  Yes Historical Provider, MD  meclizine (ANTIVERT) 25 MG tablet Take 25 mg by mouth 2 (two) times daily as needed for dizziness.  10/18/13  Yes Historical Provider, MD  metFORMIN (GLUCOPHAGE) 500 MG tablet Take 1,000 mg by mouth 2 (two) times daily with a meal.   Yes Historical Provider, MD  Naphazoline-Pheniramine (EYE ALLERGY RELIEF OP) Apply 1-2 drops to eye 2 (two) times daily as needed (allergy eyes).    Yes Historical Provider, MD  nitroGLYCERIN (NITROSTAT) 0.4 MG SL tablet Place 1 tablet (0.4 mg total) under the tongue every 5 (five) minutes as needed for chest pain. 10/09/14 11/30/17 Yes Imogene Burn, PA-C  omeprazole (PRILOSEC) 20 MG capsule Take 20 mg by mouth daily.   Yes Historical Provider, MD  PHENADOZ 25 MG suppository Place 2.5 mg rectally daily as needed. (FOR HEADACHES) 09/26/14  Yes Historical Provider, MD  simvastatin (ZOCOR) 20 MG tablet Take 1 tablet by mouth daily. 12/14/12  Yes Historical Provider, MD  spironolactone (ALDACTONE) 50 MG tablet Take 50 mg by mouth daily. 08/15/15  Yes Historical Provider, MD    traMADol (ULTRAM) 50 MG tablet Take 50 mg by mouth every 12 (twelve) hours as needed for moderate pain.   Yes Historical Provider, MD  valsartan-hydrochlorothiazide (DIOVAN-HCT) 320-25 MG per tablet Take 1 tablet by mouth daily.   Yes Historical Provider, MD    Allergies: No Known Allergies  Social History   Social History  . Marital Status: Married    Spouse Name: Jeneen Rinks  . Number of Children: 3  . Years of Education: 12   Occupational History  . retired    Social History Main Topics  . Smoking status: Former Smoker -- 1.50 packs/day for 34 years    Types: Cigarettes    Quit date: 05/04/1997  . Smokeless tobacco: Never Used  . Alcohol Use: No  . Drug Use: No  . Sexual Activity: Not on file   Other Topics Concern  . Not  on file   Social History Narrative   Patient lives at home with her husband Jeneen Rinks .   Retired.   Education 12 th grade.   Right handed   Caffeine two cups of coffee.     Family History  Problem Relation Age of Onset  . Heart Problems Mother   . Heart Problems Father     ROS:  Please see the history of present illness.     All other systems reviewed and negative.    Physical Exam:   Blood pressure 136/66, pulse 72, height 5\' 6"  (1.676 m), weight 231 lb 3.2 oz (104.872 kg). General: Well developed, well nourished female in no acute distress. Head: Normocephalic, atraumatic, sclera non-icteric, no xanthomas, nares are without discharge. EENT: normal  Lymph Nodes:  none Neck: Negative for carotid bruits. JVD not elevated. Back:without scoliosis kyphosis  Lungs: Clear bilaterally to auscultation without wheezes, rales, or rhonchi. Breathing is unlabored. Heart: slow RRR with S1 S2. No   murmur . No rubs, or gallops appreciated. Abdomen: Soft, non-tender, non-distended with normoactive bowel sounds. No hepatomegaly. No rebound/guarding. No obvious abdominal masses. Msk:  Strength and tone appear normal for age. Extremities: No clubbing or cyanosis.  No  edema.  Distal pedal pulses are 2+ and equal bilaterally. Skin: Warm and Dry Neuro: Alert and oriented X 3. CN III-XII intact Grossly normal sensory and motor function . Psych:  Responds to questions appropriately with a normal affect.      Labs: Cardiac Enzymes No results for input(s): CKTOTAL, CKMB, TROPONINI in the last 72 hours. CBC Lab Results  Component Value Date   WBC 12.0* 11/11/2014   HGB 11.7* 11/11/2014   HCT 35.7* 11/11/2014   MCV 90.8 11/11/2014   PLT 412* 11/11/2014   PROTIME: No results for input(s): LABPROT, INR in the last 72 hours. Chemistry No results for input(s): NA, K, CL, CO2, BUN, CREATININE, CALCIUM, PROT, BILITOT, ALKPHOS, ALT, AST, GLUCOSE in the last 168 hours.  Invalid input(s): LABALBU Lipids No results found for: CHOL, HDL, LDLCALC, TRIG BNP PRO B NATRIURETIC PEPTIDE (BNP)  Date/Time Value Ref Range Status  11/12/2008 11:26 AM 110.0* 0.0-100.0 pg/mL Final   Thyroid Function Tests: No results for input(s): TSH, T4TOTAL, T3FREE, THYROIDAB in the last 72 hours.  Invalid input(s): FREET3 Miscellaneous No results found for: DDIMER  Radiology/Studies:  No results found.  EKG: 6/20 sinus rhythm at 63 Intervals 22/08/38  Recorder multiple strips demonstrate junctional rates in the low 40s. She has documented rates at home in the 30s. He hasn't been associated repeatedly with dizziness.   Assessment and Plan:  Sinus node dysfunction  PVCs  Presyncope       The patient has symptomatic sinus node dysfunction with junctional escape beats PVCs giving rise to problematic palpitations. These have persisted despite the elimination of A-V nodal blocking agents; hence there is no evidence of secondary causes.  We discussed treatment options which I think are limited to pacing.   The benefits and risks were reviewed including but not limited to death,  perforation, infection, lead dislodgement and device malfunction.  The patient  understands agrees and is willing to proceed.     Virl Axe

## 2015-11-08 NOTE — Discharge Instructions (Signed)
° ° °  Supplemental Discharge Instructions for  Pacemaker/Defibrillator Patients  Activity No heavy lifting or vigorous activity with your left/right arm for 6 to 8 weeks.  Do not raise your left/right arm above your head for one week.  Gradually raise your affected arm as drawn below.             11/12/15                     11/13/15                    11/14/15                  11/15/15   NO DRIVING for  1 week  ; you may begin driving on  [id number]   .  WOUND CARE - Keep the wound area clean and dry.  Do not get this area wet for 24 hours. No showers for 24hours; you may shower on  11/09/15 evening   . - The tape/steri-strips on your wound will fall off; do not pull them off.  No bandage is needed on the site.  DO  NOT apply any creams, oils, or ointments to the wound area. - If you notice any drainage or discharge from the wound, any swelling or bruising at the site, or you develop a fever > 101? F after you are discharged home, call the office at once.  Special Instructions - You are still able to use cellular telephones; use the ear opposite the side where you have your pacemaker/defibrillator.  Avoid carrying your cellular phone near your device. - When traveling through airports, show security personnel your identification card to avoid being screened in the metal detectors.  Ask the security personnel to use the hand wand. - Avoid arc welding equipment, MRI testing (magnetic resonance imaging), TENS units (transcutaneous nerve stimulators).  Call the office for questions about other devices. - Avoid electrical appliances that are in poor condition or are not properly grounded. - Microwave ovens are safe to be near or to operate.  Additional information for defibrillator patients should your device go off: - If your device goes off ONCE and you feel fine afterward, notify the device clinic nurses. - If your device goes off ONCE and you do not feel well afterward, call 911. - If your device  goes off TWICE, call 911. - If your device goes off THREE times in one day, call 911.  DO NOT DRIVE YOURSELF OR A FAMILY MEMBER WITH A DEFIBRILLATOR TO THE HOSPITAL--CALL 911.

## 2015-11-08 NOTE — Progress Notes (Signed)
   11/08/15 2250  BiPAP/CPAP/SIPAP  BiPAP/CPAP/SIPAP Pt Type Adult  Mask Type Nasal pillows  Set Rate 0 breaths/min  Respiratory Rate 18 breaths/min  IPAP 10 cmH20  EPAP 10 cmH2O  Oxygen Percent 21 %  Flow Rate 0 lpm  BiPAP/CPAP/SIPAP CPAP  Patient Home Equipment No (Except for the nasal prongs belongs to the patient)  Auto Titrate No  BiPAP/CPAP /SiPAP Vitals  Pulse Rate 77  Resp 18  SpO2 96 %  Patient placed on CPAP of 10 cmH2o with her own nasal prongs she brought from home, She tolerates it very well at this time.

## 2015-11-09 ENCOUNTER — Ambulatory Visit (HOSPITAL_COMMUNITY): Payer: Commercial Managed Care - HMO

## 2015-11-09 DIAGNOSIS — E669 Obesity, unspecified: Secondary | ICD-10-CM | POA: Diagnosis not present

## 2015-11-09 DIAGNOSIS — I495 Sick sinus syndrome: Secondary | ICD-10-CM | POA: Diagnosis not present

## 2015-11-09 DIAGNOSIS — E119 Type 2 diabetes mellitus without complications: Secondary | ICD-10-CM | POA: Diagnosis not present

## 2015-11-09 DIAGNOSIS — I272 Other secondary pulmonary hypertension: Secondary | ICD-10-CM | POA: Diagnosis not present

## 2015-11-09 DIAGNOSIS — I2510 Atherosclerotic heart disease of native coronary artery without angina pectoris: Secondary | ICD-10-CM | POA: Diagnosis not present

## 2015-11-09 DIAGNOSIS — K219 Gastro-esophageal reflux disease without esophagitis: Secondary | ICD-10-CM | POA: Diagnosis not present

## 2015-11-09 DIAGNOSIS — E785 Hyperlipidemia, unspecified: Secondary | ICD-10-CM | POA: Diagnosis not present

## 2015-11-09 DIAGNOSIS — Z87891 Personal history of nicotine dependence: Secondary | ICD-10-CM | POA: Diagnosis not present

## 2015-11-09 DIAGNOSIS — J449 Chronic obstructive pulmonary disease, unspecified: Secondary | ICD-10-CM | POA: Diagnosis not present

## 2015-11-09 DIAGNOSIS — E039 Hypothyroidism, unspecified: Secondary | ICD-10-CM | POA: Diagnosis not present

## 2015-11-09 LAB — GLUCOSE, CAPILLARY: Glucose-Capillary: 81 mg/dL (ref 65–99)

## 2015-11-09 MED ORDER — HYDROCHLOROTHIAZIDE 25 MG PO TABS: 25.0000 mg | ORAL_TABLET | Freq: Every day | ORAL | Status: DC

## 2015-11-09 NOTE — Op Note (Signed)
NAME:  Kayla [name], Kayla [name] NO.:  [id number]  MEDICAL RECORD NO.:  [id number]  LOCATION:  [id number]                        FACILITY:  [facility]  PHYSICIAN:  Deboraha Sprang, MD, FACCDATE OF BIRTH:  [birth year]  DATE OF PROCEDURE:  11/08/2015 DATE OF DISCHARGE:                              OPERATIVE REPORT   PREOPERATIVE DIAGNOSIS:  Sinus node dysfunction and symptomatic bradycardia.  POSTOPERATIVE DIAGNOSIS:  Sinus node dysfunction and symptomatic bradycardia.  PROCEDURE:  Dual-chamber pacemaker implantation with His bundle pacing.  DESCRIPTION OF PROCEDURE:  Following obtaining informed consent, the patient was brought to the electrophysiology laboratory and placed on the fluoroscopic table in supine position.  After routine prep and drape of the left upper chest, lidocaine was infiltrated in the prepectoral subclavicular region.  An incision was made and carried down to layer of the prepectoral fascia using sharp dissection and electrocautery.  The pocket was formed similarly, hemostasis was obtained.  Thereafter, attention was turned to gain access to the extrathoracic left subclavian vein, which was accomplished with modest difficulty with puncture of the artery on one occasion.  Pressure was held for 2 minutes.  Thereafter, two successful venipunctures were accomplished. Guidewires were placed and retained and initially a Medtronic His bundle pacing sheath was deployed over [place] wire into the right ventricle. The Medtronic 3830 lead, serial number [id number] was deployed through the sheath down to the tip.  Multiple sites were mapped.  In the final location, the AV ratio was approximately 1-5, no His was seen, the lead was deployed notwithstanding.  The R-wave was 2.9 with a pace impedance of 426, a threshold 0.9 V at 1.0 milliseconds.  Current of threshold was 2.0 mA.  The current of injury was not assessed.  The lead was secured manually and the sheath was slit  and removed.  The lead was secured. Thereafter, a Medtronic [id number], 52-cm lead, serial number [id number] was deployed through the right atrial appendage where bipolar P-wave was 3.5 with a pace impedance of 940 and threshold of 0.7 V at 0.5 milliseconds. Current of threshold was 0.9 mA, and there was no diaphragmatic pacing at 10 V.  Current of injury was moderate.  These leads were secured to the prepectoral fascia and the leads were then attached to a Medtronic Advisa DR device, serial number [id number].  P synchronous pacing with a narrow QRS complex was identified.  The pocket was copiously irrigated with antibiotic-containing saline solution.  Hemostasis was assured. The leads and pulse generator were placed in the pocket and secured to the prepectoral fascia.  A Surgicel was placed in the cephalad aspect of the pocket and the wound was then closed in 2 layers in normal fashion. The wound was washed, dried and a Dermabond dressing was applied.  Needle counts, sponge counts and instrument counts were correct at the end of the procedure according to the staff.  The patient tolerated the procedure without apparent complication.     Deboraha Sprang, MD, [facility]     SCK/MEDQ  D:  11/08/2015  T:  11/09/2015  Job:  [id number]

## 2015-11-11 MED FILL — Gentamicin Sulfate Inj 40 MG/ML: INTRAMUSCULAR | Qty: 2 | Status: AC

## 2015-11-11 MED FILL — Sodium Chloride Irrigation Soln 0.9%: Qty: 500 | Status: AC

## 2015-11-13 ENCOUNTER — Ambulatory Visit (HOSPITAL_COMMUNITY): Payer: Commercial Managed Care - HMO

## 2015-11-14 ENCOUNTER — Encounter: Payer: Self-pay | Admitting: Internal Medicine

## 2015-11-14 ENCOUNTER — Ambulatory Visit (INDEPENDENT_AMBULATORY_CARE_PROVIDER_SITE_OTHER): Payer: Commercial Managed Care - HMO | Admitting: *Deleted

## 2015-11-14 ENCOUNTER — Ambulatory Visit (HOSPITAL_COMMUNITY): Payer: Commercial Managed Care - HMO

## 2015-11-14 DIAGNOSIS — I495 Sick sinus syndrome: Secondary | ICD-10-CM

## 2015-11-14 LAB — CUP PACEART INCLINIC DEVICE CHECK
Battery Voltage: 3.12 V
Brady Statistic AP VP Percent: 0.06 %
Brady Statistic AP VS Percent: 17.37 %
Brady Statistic AS VP Percent: 0.53 %
Brady Statistic AS VS Percent: 82.05 %
Brady Statistic RA Percent Paced: 17.42 %
Brady Statistic RV Percent Paced: 0.59 %
Date Time Interrogation Session: 20170713133940
Implantable Lead Implant Date: 20170707
Implantable Lead Implant Date: 20170707
Implantable Lead Location: 753859
Implantable Lead Location: 753860
Implantable Lead Model: 3830
Implantable Lead Model: 5076
Lead Channel Impedance Value: 266 Ohm
Lead Channel Impedance Value: 380 Ohm
Lead Channel Impedance Value: 380 Ohm
Lead Channel Impedance Value: 418 Ohm
Lead Channel Pacing Threshold Amplitude: 0.5 V
Lead Channel Pacing Threshold Amplitude: 1 V
Lead Channel Pacing Threshold Pulse Width: 0.4 ms
Lead Channel Pacing Threshold Pulse Width: 1 ms
Lead Channel Sensing Intrinsic Amplitude: 5.75 mV
Lead Channel Sensing Intrinsic Amplitude: 6.125 mV
Lead Channel Setting Pacing Amplitude: 3.5 V
Lead Channel Setting Pacing Amplitude: 3.75 V
Lead Channel Setting Pacing Pulse Width: 1 ms
Lead Channel Setting Sensing Sensitivity: 2.8 mV

## 2015-11-14 NOTE — Progress Notes (Signed)
Wound check appointment. Dermabond removed. Wound without redness or edema. Incision edges approximated, wound well healed. Normal device function. Thresholds, sensing, and impedances consistent with implant measurements. Device programmed at 3.5V (auto capture programmed on in RA) for extra safety margin until 3 month visit. Histogram distribution appropriate for patient and level of activity. No mode switches or high ventricular rates noted. Patient educated about wound care, arm mobility, lifting restrictions. ROV in 3 months with SK.

## 2015-11-25 ENCOUNTER — Telehealth: Payer: Self-pay | Admitting: Pulmonary Disease

## 2015-11-25 MED ORDER — ALBUTEROL SULFATE HFA 108 (90 BASE) MCG/ACT IN AERS: 2.0000 | INHALATION_SPRAY | Freq: Four times a day (QID) | RESPIRATORY_TRACT | 3 refills | Status: DC | PRN

## 2015-11-25 MED ORDER — BUDESONIDE-FORMOTEROL FUMARATE 160-4.5 MCG/ACT IN AERO: 2.0000 | INHALATION_SPRAY | Freq: Two times a day (BID) | RESPIRATORY_TRACT | 3 refills | Status: DC

## 2015-11-25 NOTE — Telephone Encounter (Signed)
Last ov 12.13.16 w/ VS, follow up in 1 year Proair and Symbicort were last filled at that visit Called spoke with patient to verify the meds she needs filled and the pharmacy  Refills sent  Nothing further needed; will sign off

## 2015-12-04 DIAGNOSIS — G4733 Obstructive sleep apnea (adult) (pediatric): Secondary | ICD-10-CM | POA: Diagnosis not present

## 2015-12-10 ENCOUNTER — Telehealth: Payer: Self-pay | Admitting: Pulmonary Disease

## 2015-12-10 DIAGNOSIS — E1149 Type 2 diabetes mellitus with other diabetic neurological complication: Secondary | ICD-10-CM | POA: Diagnosis not present

## 2015-12-10 DIAGNOSIS — E890 Postprocedural hypothyroidism: Secondary | ICD-10-CM | POA: Diagnosis not present

## 2015-12-10 DIAGNOSIS — Z79899 Other long term (current) drug therapy: Secondary | ICD-10-CM | POA: Diagnosis not present

## 2015-12-10 DIAGNOSIS — E782 Mixed hyperlipidemia: Secondary | ICD-10-CM | POA: Diagnosis not present

## 2015-12-10 MED ORDER — FLUTICASONE PROPIONATE 50 MCG/ACT NA SUSP: NASAL | 3 refills | Status: DC

## 2015-12-10 NOTE — Telephone Encounter (Signed)
Spoke with pt and she is requesting refill on Flonase sent to [facility]. Rx sent. Nothing further needed.

## 2015-12-16 DIAGNOSIS — E782 Mixed hyperlipidemia: Secondary | ICD-10-CM | POA: Diagnosis not present

## 2015-12-16 DIAGNOSIS — I10 Essential (primary) hypertension: Secondary | ICD-10-CM | POA: Diagnosis not present

## 2015-12-16 DIAGNOSIS — E875 Hyperkalemia: Secondary | ICD-10-CM | POA: Diagnosis not present

## 2015-12-16 DIAGNOSIS — E1149 Type 2 diabetes mellitus with other diabetic neurological complication: Secondary | ICD-10-CM | POA: Diagnosis not present

## 2015-12-16 DIAGNOSIS — F419 Anxiety disorder, unspecified: Secondary | ICD-10-CM | POA: Diagnosis not present

## 2015-12-16 DIAGNOSIS — N183 Chronic kidney disease, stage 3 (moderate): Secondary | ICD-10-CM | POA: Diagnosis not present

## 2015-12-16 DIAGNOSIS — Z6836 Body mass index (BMI) 36.0-36.9, adult: Secondary | ICD-10-CM | POA: Diagnosis not present

## 2015-12-16 DIAGNOSIS — E890 Postprocedural hypothyroidism: Secondary | ICD-10-CM | POA: Diagnosis not present

## 2015-12-30 DIAGNOSIS — E875 Hyperkalemia: Secondary | ICD-10-CM | POA: Diagnosis not present

## 2015-12-30 DIAGNOSIS — E890 Postprocedural hypothyroidism: Secondary | ICD-10-CM | POA: Diagnosis not present

## 2016-01-15 DIAGNOSIS — Z7984 Long term (current) use of oral hypoglycemic drugs: Secondary | ICD-10-CM | POA: Diagnosis not present

## 2016-01-15 DIAGNOSIS — H2513 Age-related nuclear cataract, bilateral: Secondary | ICD-10-CM | POA: Diagnosis not present

## 2016-01-15 DIAGNOSIS — E119 Type 2 diabetes mellitus without complications: Secondary | ICD-10-CM | POA: Diagnosis not present

## 2016-02-04 ENCOUNTER — Encounter: Payer: Self-pay | Admitting: Internal Medicine

## 2016-02-04 DIAGNOSIS — H25813 Combined forms of age-related cataract, bilateral: Secondary | ICD-10-CM | POA: Diagnosis not present

## 2016-02-04 DIAGNOSIS — E119 Type 2 diabetes mellitus without complications: Secondary | ICD-10-CM | POA: Diagnosis not present

## 2016-02-07 DIAGNOSIS — Z23 Encounter for immunization: Secondary | ICD-10-CM | POA: Diagnosis not present

## 2016-02-18 ENCOUNTER — Encounter: Payer: Self-pay | Admitting: Internal Medicine

## 2016-02-18 ENCOUNTER — Ambulatory Visit (INDEPENDENT_AMBULATORY_CARE_PROVIDER_SITE_OTHER): Payer: Commercial Managed Care - HMO | Admitting: Internal Medicine

## 2016-02-18 VITALS — BP 132/64 | HR 85 | Ht 66.0 in | Wt 229.0 lb

## 2016-02-18 DIAGNOSIS — I493 Ventricular premature depolarization: Secondary | ICD-10-CM

## 2016-02-18 DIAGNOSIS — R079 Chest pain, unspecified: Secondary | ICD-10-CM

## 2016-02-18 DIAGNOSIS — Z950 Presence of cardiac pacemaker: Secondary | ICD-10-CM | POA: Diagnosis not present

## 2016-02-18 DIAGNOSIS — I495 Sick sinus syndrome: Secondary | ICD-10-CM

## 2016-02-18 MED ORDER — FUROSEMIDE 20 MG PO TABS: 20.0000 mg | ORAL_TABLET | Freq: Every day | ORAL | 3 refills | Status: DC

## 2016-02-18 NOTE — Progress Notes (Signed)
Patient Care Team: [name], MD as PCP - General (Family Medicine)   HPI  Kayla [name] is a 75 y.o. female Seen in follow-up for pacemaker implanted (7/17)  for sinus bradycardia.     Because of the above, she was given an event recorder which demonstrates junctional rhythms in the high 30s and low 40s associated with symptoms of dizziness  She has a history of nonobstructive coronary artery disease by catheterization 2006 with a low risk Myoview 2013.  Echocardiogram 2015 normal LV function mild left atrial enlargement  She has had problems with fluid accumulation and this tracks with worsening dyspnea. She has been on hydrochlorothiazide in the past but this was stopped by her PCP because of concerns regarding interactions with   PROLONGED episode of L arm discomfort assoc with some diaphoresis, but no chest discomfort or SOB  No edema  She also has orthopnea but this is aggravated by lying flat as opposed to being propped up; she denies any associated brackish taste.     Past Medical History:  Diagnosis Date  . Arthritis   . CAD (coronary artery disease)    Mild per remote cath in 2006, /    Nuclear, January, 2013, no significant abnormality,  . Carotid artery disease ([place])    Doppler, January, 2013, 0 63% RIC A., 14-97% LICA, stable  . Complication of anesthesia    wakes up "mean"  . COPD (chronic obstructive pulmonary disease) ([place])   . Diabetes mellitus, type 2 ([place])   . Diverticula of colon   . Dizziness    occasional  . Ejection fraction   . GERD (gastroesophageal reflux disease)   . Goiter    hx of  . Headache(784.0)    migraine  . Hyperlipidemia   . Hypothyroidism   . Left ventricular hypertrophy   . Leg edema    occasional swelling  . Obesity   . OSA (obstructive sleep apnea)    cpap setting of 12  . PONV (postoperative nausea and vomiting)    severe after every surgery  . Pulmonary hypertension, secondary     Past Surgical  History:  Procedure Laterality Date  . ABDOMINAL HYSTERECTOMY    . ANTERIOR CERVICAL DECOMP/DISCECTOMY FUSION N/A 11/07/2014   Procedure: Anterior Cervical diskectomy and fusion Cervical four-five, Cervical five-six, Cervical six-seven;  Surgeon: Kary Kos, MD;  Location: [facility];  Service: Neurosurgery;  Laterality: N/A;  . BACK SURGERY  1990's   lower back  . EP IMPLANTABLE DEVICE N/A 11/08/2015   Procedure: Pacemaker Implant;  Surgeon: Deboraha Sprang, MD;  Location: [facility];  Service: Cardiovascular;  Laterality: N/A;  . THYROIDECTOMY  2011  . TOTAL KNEE ARTHROPLASTY  07/27/2011   Procedure: TOTAL KNEE ARTHROPLASTY;  Surgeon: Mauri Pole, MD;  Location: [facility];  Service: Orthopedics;  Laterality: Left;    Current Outpatient Prescriptions  Medication Sig Dispense Refill  . acetaminophen (TYLENOL) 500 MG tablet Take 1,000 mg by mouth every 8 (eight) hours as needed for mild pain or moderate pain.    Marland Kitchen albuterol (PROAIR HFA) 108 (90 Base) MCG/ACT inhaler Inhale 2 puffs into the lungs every 6 (six) hours as needed for wheezing or shortness of breath. 3 Inhaler 3  . albuterol (PROVENTIL) (2.5 MG/3ML) 0.083% nebulizer solution Take 2.5 mg by nebulization every 6 (six) hours as needed for wheezing or shortness of breath.    Marland Kitchen amLODipine (NORVASC) 5 MG tablet Take 5  mg by mouth daily.     Marland Kitchen aspirin 81 MG tablet Take 81 mg by mouth daily.    . budesonide-formoterol (SYMBICORT) 160-4.5 MCG/ACT inhaler Inhale 2 puffs into the lungs 2 (two) times daily. 3 Inhaler 3  . Cholecalciferol (D3-1000 PO) Take 1 tablet by mouth daily.    . Cyanocobalamin (B-12 PO) Take 1 tablet by mouth daily.    . fluticasone (FLONASE) 50 MCG/ACT nasal spray USE 2 SPRAYS NASALLY DAILY 48 g 3  . gabapentin (NEURONTIN) 600 MG tablet Take 600 mg by mouth 3 (three) times daily.     Marland Kitchen HYDROcodone-acetaminophen (NORCO/VICODIN) 5-325 MG tablet Take 1 tablet by mouth as needed for moderate pain.    Marland Kitchen insulin glargine  (LANTUS) 100 UNIT/ML injection Inject 100 Units into the skin 2 (two) times daily.     Marland Kitchen levothyroxine (SYNTHROID, LEVOTHROID) 200 MCG tablet Take 200 mcg by mouth See admin instructions. Alternates with 175 mcg every other day    . LORazepam (ATIVAN) 2 MG tablet Take 2 mg by mouth daily as needed. FOR DEPRESSION    . meclizine (ANTIVERT) 25 MG tablet Take 25 mg by mouth 2 (two) times daily as needed for dizziness.     . metFORMIN (GLUCOPHAGE) 500 MG tablet Take 1,000 mg by mouth 2 (two) times daily with a meal.    . Naphazoline-Pheniramine (EYE ALLERGY RELIEF OP) Apply 1-2 drops to eye 2 (two) times daily as needed (allergy eyes).     . nitroGLYCERIN (NITROSTAT) 0.4 MG SL tablet Place 1 tablet (0.4 mg total) under the tongue every 5 (five) minutes as needed for chest pain. 25 tablet 3  . omeprazole (PRILOSEC) 20 MG capsule Take 20 mg by mouth daily.    Marland Kitchen PHENADOZ 25 MG suppository Place 2.5 mg rectally daily as needed. (FOR HEADACHES)    . simvastatin (ZOCOR) 20 MG tablet Take 1 tablet by mouth daily.    . traMADol (ULTRAM) 50 MG tablet Take 50 mg by mouth every 12 (twelve) hours as needed for moderate pain.    Marland Kitchen VALSARTAN-HYDROCHLOROTHIAZIDE PO Take by mouth.     No current facility-administered medications for this visit.     No Known Allergies    Review of Systems negative except from HPI and PMH  Physical Exam BP 132/64   Pulse 85   Ht 5\' 6"  (1.676 m)   Wt 229 lb (103.9 kg)   SpO2 95%   BMI 36.96 kg/m  Well developed and well nourished in no acute distress HENT normal E scleral and icterus clear Neck Supple JVP flat; carotids brisk and full Clear to ausculation Device pocket well healed; without hematoma or erythema.  There is no tethering   Regular rate and rhythm, no murmurs gallops or rub Soft with active bowel sounds No clubbing cyanosis  Edema Alert and oriented, grossly normal motor and sensory function Skin Warm and Dry  ECG demonstrates atrial pacing at  88  Intervals 22/08/35  otherwise normal   Assessment and  Plan  Sinus node dysfunction  Pacemaker-Medtronic   left arm discomfort  COPD  Nocturnal dyspnea   Device function is normal. There is a modest elevation the RV threshold. This is still within range of normal coupling voltages. Device was reprogrammed.  Heart rate excursion was adequate and exercise   levels are increasing as measured by the device  Her left arm discomfort is almost certainly not cardiac; echocardiogram is unchanged. We will check a troponin however given the associated diaphoresis.  Her nocturnal dyspnea is probably related to reflux as it is worse recumbent.      Current medicines are reviewed at length with the patient today .  The patient does not  have concerns regarding medicines.

## 2016-02-18 NOTE — Patient Instructions (Signed)
Medication Instructions: - Your physician has recommended you make the following change in your medication:  1) Stop hydrochlorothiazide 2) Start lasix (furosemide) 20 mg once daily  Labwork: - Your physician recommends that you have lab work today: Troponin  Procedures/Testing: - none ordered  Follow-Up: - Remote monitoring is used to monitor your Pacemaker of ICD from home. This monitoring reduces the number of office visits required to check your device to one time per year. It allows [place] to keep an eye on the functioning of your device to ensure it is working properly. You are scheduled for a device check from home on 05/19/16. You may send your transmission at any time that day. If you have a wireless device, the transmission will be sent automatically. After your physician reviews your transmission, you will receive a postcard with your next transmission date.  - Your physician wants you to follow-up in: 9 months with Dr. Caryl Comes. You will receive a reminder letter in the mail two months in advance. If you don't receive a letter, please call our office to schedule the follow-up appointment.   Any Additional Special Instructions Will Be Listed Below (If Applicable).     If you need a refill on your cardiac medications before your next appointment, please call your pharmacy.

## 2016-02-20 DIAGNOSIS — H25812 Combined forms of age-related cataract, left eye: Secondary | ICD-10-CM | POA: Diagnosis not present

## 2016-02-20 DIAGNOSIS — H43813 Vitreous degeneration, bilateral: Secondary | ICD-10-CM | POA: Diagnosis not present

## 2016-02-20 DIAGNOSIS — Z9849 Cataract extraction status, unspecified eye: Secondary | ICD-10-CM | POA: Diagnosis not present

## 2016-02-20 DIAGNOSIS — H2512 Age-related nuclear cataract, left eye: Secondary | ICD-10-CM | POA: Diagnosis not present

## 2016-02-20 DIAGNOSIS — Z961 Presence of intraocular lens: Secondary | ICD-10-CM | POA: Diagnosis not present

## 2016-02-20 DIAGNOSIS — H25012 Cortical age-related cataract, left eye: Secondary | ICD-10-CM | POA: Diagnosis not present

## 2016-02-20 DIAGNOSIS — H5212 Myopia, left eye: Secondary | ICD-10-CM | POA: Diagnosis not present

## 2016-02-20 DIAGNOSIS — H5201 Hypermetropia, right eye: Secondary | ICD-10-CM | POA: Diagnosis not present

## 2016-02-20 LAB — CUP PACEART INCLINIC DEVICE CHECK
Date Time Interrogation Session: 20171019165624
Implantable Lead Implant Date: 20170707
Implantable Lead Implant Date: 20170707
Implantable Lead Location: 753859
Implantable Lead Location: 753860
Implantable Lead Model: 3830
Implantable Lead Model: 5076
Lead Channel Setting Pacing Amplitude: 3.5 V
Lead Channel Setting Pacing Amplitude: 3.75 V
Lead Channel Setting Pacing Pulse Width: 1 ms
Lead Channel Setting Sensing Sensitivity: 2.8 mV

## 2016-02-20 LAB — TROPONIN I

## 2016-02-21 ENCOUNTER — Other Ambulatory Visit: Payer: Commercial Managed Care - HMO | Admitting: *Deleted

## 2016-02-21 LAB — TROPONIN I: Troponin I: <0.03 ng/mL (ref <0.03)

## 2016-03-09 ENCOUNTER — Ambulatory Visit (INDEPENDENT_AMBULATORY_CARE_PROVIDER_SITE_OTHER): Payer: Commercial Managed Care - HMO | Admitting: Pulmonary Disease

## 2016-03-09 ENCOUNTER — Ambulatory Visit (INDEPENDENT_AMBULATORY_CARE_PROVIDER_SITE_OTHER)
Admission: RE | Admit: 2016-03-09 | Discharge: 2016-03-09 | Disposition: A | Discharge status: Home / Self Care | Payer: Commercial Managed Care - HMO | Source: Ambulatory Visit | Attending: Pulmonary Disease | Admitting: Pulmonary Disease

## 2016-03-09 ENCOUNTER — Encounter: Payer: Self-pay | Admitting: Pulmonary Disease

## 2016-03-09 VITALS — BP 112/58 | HR 74 | Ht 66.0 in | Wt 231.8 lb

## 2016-03-09 DIAGNOSIS — J441 Chronic obstructive pulmonary disease with (acute) exacerbation: Secondary | ICD-10-CM

## 2016-03-09 DIAGNOSIS — R0602 Shortness of breath: Secondary | ICD-10-CM | POA: Diagnosis not present

## 2016-03-09 DIAGNOSIS — R079 Chest pain, unspecified: Secondary | ICD-10-CM | POA: Diagnosis not present

## 2016-03-09 DIAGNOSIS — G4733 Obstructive sleep apnea (adult) (pediatric): Secondary | ICD-10-CM | POA: Diagnosis not present

## 2016-03-09 DIAGNOSIS — H2511 Age-related nuclear cataract, right eye: Secondary | ICD-10-CM | POA: Diagnosis not present

## 2016-03-09 DIAGNOSIS — R05 Cough: Secondary | ICD-10-CM | POA: Diagnosis not present

## 2016-03-09 DIAGNOSIS — Z9989 Dependence on other enabling machines and devices: Secondary | ICD-10-CM | POA: Diagnosis not present

## 2016-03-09 LAB — NITRIC OXIDE: Nitric Oxide: 7

## 2016-03-09 MED ORDER — AZITHROMYCIN 250 MG PO TABS: ORAL_TABLET | ORAL | 0 refills | Status: DC

## 2016-03-09 NOTE — Patient Instructions (Signed)
Zithromax 250 mg pill >> 2 pills on day 1, then 1 pill daily for next 4 days  Chest xray today  Call if not feeling better after finishing antibiotic  Follow up in 6 months

## 2016-03-09 NOTE — Progress Notes (Signed)
Current Outpatient Prescriptions on File Prior to Visit  Medication Sig  . acetaminophen (TYLENOL) 500 MG tablet Take 1,000 mg by mouth every 8 (eight) hours as needed for mild pain or moderate pain.  Marland Kitchen albuterol (PROAIR HFA) 108 (90 Base) MCG/ACT inhaler Inhale 2 puffs into the lungs every 6 (six) hours as needed for wheezing or shortness of breath.  Marland Kitchen albuterol (PROVENTIL) (2.5 MG/3ML) 0.083% nebulizer solution Take 2.5 mg by nebulization every 6 (six) hours as needed for wheezing or shortness of breath.  Marland Kitchen amLODipine (NORVASC) 5 MG tablet Take 5 mg by mouth daily.   Marland Kitchen aspirin 81 MG tablet Take 81 mg by mouth daily.  . budesonide-formoterol (SYMBICORT) 160-4.5 MCG/ACT inhaler Inhale 2 puffs into the lungs 2 (two) times daily.  . Cholecalciferol (D3-1000 PO) Take 1 tablet by mouth daily.  . Cyanocobalamin (B-12 PO) Take 1 tablet by mouth daily.  . fluticasone (FLONASE) 50 MCG/ACT nasal spray USE 2 SPRAYS NASALLY DAILY  . furosemide (LASIX) 20 MG tablet Take 1 tablet (20 mg total) by mouth daily.  Marland Kitchen gabapentin (NEURONTIN) 600 MG tablet Take 600 mg by mouth 3 (three) times daily.   Marland Kitchen HYDROcodone-acetaminophen (NORCO/VICODIN) 5-325 MG tablet Take 1 tablet by mouth as needed for moderate pain.  Marland Kitchen insulin glargine (LANTUS) 100 UNIT/ML injection Inject 100 Units into the skin 2 (two) times daily.   Marland Kitchen levothyroxine (SYNTHROID, LEVOTHROID) 200 MCG tablet Take 200 mcg by mouth See admin instructions. Alternates with 175 mcg every other day  . LORazepam (ATIVAN) 2 MG tablet Take 2 mg by mouth daily as needed. FOR DEPRESSION  . meclizine (ANTIVERT) 25 MG tablet Take 25 mg by mouth 2 (two) times daily as needed for dizziness.   . metFORMIN (GLUCOPHAGE) 500 MG tablet Take 1,000 mg by mouth 2 (two) times daily with a meal.  . Naphazoline-Pheniramine (EYE ALLERGY RELIEF OP) Apply 1-2 drops to eye 2 (two) times daily as needed (allergy eyes).   . nitroGLYCERIN (NITROSTAT) 0.4 MG SL tablet Place 1 tablet (0.4 mg  total) under the tongue every 5 (five) minutes as needed for chest pain.  Marland Kitchen omeprazole (PRILOSEC) 20 MG capsule Take 20 mg by mouth daily.  Marland Kitchen PHENADOZ 25 MG suppository Place 2.5 mg rectally daily as needed. (FOR HEADACHES)  . simvastatin (ZOCOR) 20 MG tablet Take 1 tablet by mouth daily.  . traMADol (ULTRAM) 50 MG tablet Take 50 mg by mouth every 12 (twelve) hours as needed for moderate pain.  Marland Kitchen VALSARTAN-HYDROCHLOROTHIAZIDE PO Take by mouth.   No current facility-administered medications on file prior to visit.     Chief Complaint  Patient presents with  . Acute Visit    Pt. c/o feeling like her breathing has been getting worse,using her inhalers more, some coughing with some yellow mucus, feels dizzy,chest pain on going since yesterday    Sleep tests PSG 02/2005 >> AHI 10  Auto CPAP 09/21/12 to 10/05/12 >> used on 15 of 15 nights with average 10 hrs 22 min.  Average AHI 1.8 with mean CPAP 5 cm H2O and 90 th percentile CPAP 6 cm H2O. CPAP titration 02/19/14 >> CPAP 11 cm H2O >> AHI 0.7, PLMI 81.4, did not need supplemental oxygen  Pulmonary tests Spirometry 10/19/08 >> FEV1 1.55 (68%), FEV1% 70  Spirometry 08/29/12 >> FEV1 1.38 (59%), FEV1% 58 FeNO 03/09/16 >> 7  Cardiac tests Echo 07/31/13 >> mild LVH, EF 55 to 60%, grade 1 DD  Past medical history GERD, HLD, DM, hypothyroidism, CAD,  s/p PM  Past medical history, Family history, Social history, Allergies reviewed  Vital signs BP (!) 112/58 (BP Location: Right Arm, Patient Position: Sitting, Cuff Size: Normal)   Pulse 74   Ht 5\' 6"  (1.676 m)   Wt 231 lb 12.8 oz (105.1 kg)   SpO2 96%   BMI 37.41 kg/m    History of Present Illness: Kayla [name] is a 75 y.o. female with COPD and OSA using CPAP.  She has increased cough with yellow sputum.  Feels more short of breath.  Gets pain across her chest and back - worse when she takes deep breath.  Had sinus congestion.  Using albuterol more - helps with dyspnea but not chest  discomfort.  She feels like she is coming down with a respiratory infection.  Denies fever, skin rash, palpitations, dizziness, abdominal pain, leg swelling.  Can't sleep w/o CPAP.  FeNO was normal today.  She had difficulty with performing spirometry due to chest pain and cough.  Physical Exam:  General - alert HEENT - wears glasses, no sinus tenderness, no oral exudate, no LAN Cardiac - s1s2 regular, no murmur Chest - no wheeze/rales, tender on palpation along lower rib margins b/l Abdomen - soft, nontender Extremities - no edema Skin - no rashes Neurologic - normal strength Psychiatric - normal mood, behavior  Assessment/Plan:  Acute COPD exacerbation. - will give course of zithromax - check CXR - defer prednisone for now  Atypical chest pain. - likely rib pain from coughing - f/u CXR - OTC analgesics  COPD with chronic bronchitis. - continue symbicort with prn albuterol >> she prefers proair  Obstructive sleep apnea. - she is compliant with CPAP and reports benefit from therapy - continue auto CPAP range 5 to 15 cm H2O   Patient Instructions  Zithromax 250 mg pill >> 2 pills on day 1, then 1 pill daily for next 4 days  Chest xray today  Call if not feeling better after finishing antibiotic  Follow up in 6 months   Chesley Mires, MD [place] 03/09/2016, 9:38 AM Pager:  [phone]

## 2016-03-10 ENCOUNTER — Telehealth: Payer: Self-pay | Admitting: Pulmonary Disease

## 2016-03-10 NOTE — Telephone Encounter (Signed)
atc pt at callback # requested, line is not in service. lmtcb on mobile #.

## 2016-03-11 NOTE — Telephone Encounter (Signed)
Spoke with pt. She is aware of results. Nothing further was needed.  

## 2016-03-11 NOTE — Telephone Encounter (Signed)
Dg Chest 2 View  Result Date: 03/09/2016 CLINICAL DATA:  Cough, chest pain for 3 days EXAM: CHEST  2 VIEW COMPARISON:  11/09/2015 FINDINGS: Cardiomediastinal silhouette is stable. No acute infiltrate or pleural effusion. No pulmonary edema. Mild degenerative changes thoracic spine. Two lead cardiac pacemaker is unchanged in position. IMPRESSION: No active cardiopulmonary disease. Electronically Signed   By: Lahoma Crocker M.D.   On: 03/09/2016 10:05    Please inform her that her Chest xray looked okay.  No change to treatment plan.

## 2016-03-11 NOTE — Telephone Encounter (Signed)
Spoke with pt. She is requesting results from her CXR done on 03/09/16.  VS - please advise. Thanks.

## 2016-03-12 DIAGNOSIS — H2511 Age-related nuclear cataract, right eye: Secondary | ICD-10-CM | POA: Diagnosis not present

## 2016-03-12 DIAGNOSIS — H268 Other specified cataract: Secondary | ICD-10-CM | POA: Diagnosis not present

## 2016-03-12 DIAGNOSIS — H25011 Cortical age-related cataract, right eye: Secondary | ICD-10-CM | POA: Diagnosis not present

## 2016-03-16 DIAGNOSIS — I10 Essential (primary) hypertension: Secondary | ICD-10-CM | POA: Diagnosis not present

## 2016-03-16 DIAGNOSIS — Z6837 Body mass index (BMI) 37.0-37.9, adult: Secondary | ICD-10-CM | POA: Diagnosis not present

## 2016-03-16 DIAGNOSIS — R1011 Right upper quadrant pain: Secondary | ICD-10-CM | POA: Diagnosis not present

## 2016-03-16 DIAGNOSIS — E1149 Type 2 diabetes mellitus with other diabetic neurological complication: Secondary | ICD-10-CM | POA: Diagnosis not present

## 2016-03-17 ENCOUNTER — Other Ambulatory Visit: Payer: Self-pay | Admitting: Physician Assistant

## 2016-03-17 DIAGNOSIS — R1011 Right upper quadrant pain: Secondary | ICD-10-CM

## 2016-03-18 DIAGNOSIS — R1011 Right upper quadrant pain: Secondary | ICD-10-CM | POA: Diagnosis not present

## 2016-03-20 ENCOUNTER — Other Ambulatory Visit: Payer: Commercial Managed Care - HMO

## 2016-03-23 ENCOUNTER — Other Ambulatory Visit (INDEPENDENT_AMBULATORY_CARE_PROVIDER_SITE_OTHER): Payer: Commercial Managed Care - HMO

## 2016-03-23 ENCOUNTER — Encounter: Payer: Self-pay | Admitting: Gastroenterology

## 2016-03-23 ENCOUNTER — Encounter (INDEPENDENT_AMBULATORY_CARE_PROVIDER_SITE_OTHER): Payer: Self-pay

## 2016-03-23 ENCOUNTER — Ambulatory Visit (INDEPENDENT_AMBULATORY_CARE_PROVIDER_SITE_OTHER): Payer: Commercial Managed Care - HMO | Admitting: Gastroenterology

## 2016-03-23 VITALS — BP 146/70 | HR 68 | Ht 65.0 in | Wt 231.5 lb

## 2016-03-23 DIAGNOSIS — R1011 Right upper quadrant pain: Secondary | ICD-10-CM

## 2016-03-23 LAB — COMPREHENSIVE METABOLIC PANEL
ALT: 14 U/L (ref 0–35)
AST: 16 U/L (ref 0–37)
Albumin: 4.2 g/dL (ref 3.5–5.2)
Alkaline Phosphatase: 72 U/L (ref 39–117)
BUN: 32 mg/dL — ABNORMAL HIGH (ref 6–23)
CO2: 28 mEq/L (ref 19–32)
Calcium: 9.8 mg/dL (ref 8.4–10.5)
Chloride: 102 mEq/L (ref 96–112)
Creatinine, Ser: 1.29 mg/dL — ABNORMAL HIGH (ref 0.40–1.20)
GFR: 42.82 mL/min — ABNORMAL LOW (ref >60.00)
Glucose, Bld: 196 mg/dL — ABNORMAL HIGH (ref 70–99)
Potassium: 5.3 mEq/L — ABNORMAL HIGH (ref 3.5–5.1)
Sodium: 138 mEq/L (ref 135–145)
Total Bilirubin: 0.3 mg/dL (ref 0.2–1.2)
Total Protein: 7.8 g/dL (ref 6.0–8.3)

## 2016-03-23 LAB — CBC WITH DIFFERENTIAL/PLATELET
Basophils Absolute: 0 10*3/uL (ref 0.0–0.1)
Basophils Relative: 0.3 % (ref 0.0–3.0)
Eosinophils Absolute: 0.2 10*3/uL (ref 0.0–0.7)
Eosinophils Relative: 1.5 % (ref 0.0–5.0)
HCT: 34.9 % — ABNORMAL LOW (ref 36.0–46.0)
Hemoglobin: 11.5 g/dL — ABNORMAL LOW (ref 12.0–15.0)
Lymphocytes Relative: 15.4 % (ref 12.0–46.0)
Lymphs Abs: 1.8 10*3/uL (ref 0.7–4.0)
MCHC: 33 g/dL (ref 30.0–36.0)
MCV: 87.1 fl (ref 78.0–100.0)
Monocytes Absolute: 0.6 10*3/uL (ref 0.1–1.0)
Monocytes Relative: 5.4 % (ref 3.0–12.0)
Neutro Abs: 9.3 10*3/uL — ABNORMAL HIGH (ref 1.4–7.7)
Neutrophils Relative %: 77.4 % — ABNORMAL HIGH (ref 43.0–77.0)
Platelets: 422 10*3/uL — ABNORMAL HIGH (ref 150.0–400.0)
RBC: 4.01 Mil/uL (ref 3.87–5.11)
RDW: 14 % (ref 11.5–15.5)
WBC: 12 10*3/uL — ABNORMAL HIGH (ref 4.0–10.5)

## 2016-03-23 NOTE — Patient Instructions (Addendum)
We will your [place] report from [facility] [place].  [facility] in [place]: need labs from last week, need [place] report from them as well.  You will need cbc, cmet.  You will be set up for a CT scan of abdomen and pelvis with IV and oral contrast for your RUQ pains.  You have been scheduled for a CT scan of the abdomen and pelvis at [place] ([address]---this is in the same building as Press photographer).   You are scheduled on 03/24/16 at 3 pm. You should arrive 15 minutes prior to your appointment time for registration. Please follow the written instructions below on the day of your exam:  WARNING: IF YOU ARE ALLERGIC TO IODINE/X-RAY DYE, PLEASE NOTIFY RADIOLOGY IMMEDIATELY AT [phone]! YOU WILL BE GIVEN A 13 HOUR PREMEDICATION PREP.  1) Do not eat or drink anything after 11 am (4 hours prior to your test) 2) You have been given 2 bottles of oral contrast to drink. The solution may taste better if refrigerated, but do NOT add ice or any other liquid to this solution. Shake well before drinking.    Drink 1 bottle of contrast @ 1 pm (2 hours prior to your exam)  Drink 1 bottle of contrast @ 2 pm (1 hour prior to your exam)  You may take any medications as prescribed with a small amount of water except for the following: Metformin, Glucophage, Glucovance, Avandamet, Riomet, Fortamet, Actoplus Met, Janumet, Glumetza or Metaglip. The above medications must be held the day of the exam AND 48 hours after the exam.  The purpose of you drinking the oral contrast is to aid in the visualization of your intestinal tract. The contrast solution may cause some diarrhea. Before your exam is started, you will be given a small amount of fluid to drink. Depending on your individual set of symptoms, you may also receive an intravenous injection of x-ray contrast/dye. Plan on being at [facility] for 30 minutes or longer, depending on the type of exam you are having  performed.  This test typically takes 30-45 minutes to complete.  If you have any questions regarding your exam or if you need to reschedule, you may call the CT department at [phone] between the hours of 8:00 am and 5:00 pm, Monday-Friday.  ________________________________________________________________________

## 2016-03-23 NOTE — Progress Notes (Signed)
HPI: This is a  very pleasant 75 year old woman   who was referred to me by Hamrick, Lorin Mercy, MD  to evaluate  right upper quadrant abdominal pain .    Chief complaint is right upper quadrant abdominal pain  She had an Ultrasound in [place], [facility] last week. She tells me it was normal. She also had labs done recently at a practice in [place]. We don't have any of those results here at time of this visit  She has RUQ pain for about a week to 2 weeks.  This pain has been there previously but only intermittently.  + associated with nausea, vomiting.  The pain is constant now.  It has twinges of sharp, stabbing pains.  After mobving, eating then pain gets worse.   Overall stable weight.  She takes tylenol regularly a few per week.  No NSAIDs.    Review of systems: Pertinent positive and negative review of systems were noted in the above HPI section. Complete review of systems was performed and was otherwise normal.   Past Medical History:  Diagnosis Date  . Arthritis   . CAD (coronary artery disease)    Mild per remote cath in 2006, /    Nuclear, January, 2013, no significant abnormality,  . Carotid artery disease ([place])    Doppler, January, 2013, 0 16% RIC A., 10-96% LICA, stable  . Complication of anesthesia    wakes up "mean"  . COPD (chronic obstructive pulmonary disease) ([place])   . Diabetes mellitus, type 2 ([place])   . Diverticula of colon   . Dizziness    occasional  . Ejection fraction   . GERD (gastroesophageal reflux disease)   . Goiter    hx of  . Headache(784.0)    migraine  . Hyperlipidemia   . Hypothyroidism   . Left ventricular hypertrophy   . Leg edema    occasional swelling  . Obesity   . OSA (obstructive sleep apnea)    cpap setting of 12  . PONV (postoperative nausea and vomiting)    severe after every surgery  . Pulmonary hypertension, secondary     Past Surgical History:  Procedure Laterality Date  . ABDOMINAL HYSTERECTOMY     with  appendectomy and colon polypectomy  . ANTERIOR CERVICAL DECOMP/DISCECTOMY FUSION N/A 11/07/2014   Procedure: Anterior Cervical diskectomy and fusion Cervical four-five, Cervical five-six, Cervical six-seven;  Surgeon: Kary Kos, MD;  Location: [facility];  Service: Neurosurgery;  Laterality: N/A;  . EP IMPLANTABLE DEVICE N/A 11/08/2015   Procedure: Pacemaker Implant;  Surgeon: Deboraha Sprang, MD;  Location: [facility];  Service: Cardiovascular;  Laterality: N/A;  . LUMBAR DISC SURGERY  1990's   x 2  . THYROIDECTOMY  2011  . TOTAL KNEE ARTHROPLASTY  07/27/2011   Procedure: TOTAL KNEE ARTHROPLASTY;  Surgeon: Mauri Pole, MD;  Location: [facility];  Service: Orthopedics;  Laterality: Left;    Current Outpatient Prescriptions  Medication Sig Dispense Refill  . acetaminophen (TYLENOL) 500 MG tablet Take 1,000 mg by mouth every 8 (eight) hours as needed for mild pain or moderate pain.    Marland Kitchen albuterol (PROAIR HFA) 108 (90 Base) MCG/ACT inhaler Inhale 2 puffs into the lungs every 6 (six) hours as needed for wheezing or shortness of breath. 3 Inhaler 3  . albuterol (PROVENTIL) (2.5 MG/3ML) 0.083% nebulizer solution Take 2.5 mg by nebulization every 6 (six) hours as needed for wheezing or shortness of breath.    Marland Kitchen amLODipine (  NORVASC) 5 MG tablet Take 5 mg by mouth daily.     Marland Kitchen aspirin 81 MG tablet Take 81 mg by mouth daily.    Marland Kitchen azithromycin (ZITHROMAX) 250 MG tablet 2 pills on day one, then 1 pill daily for next 4 days 6 tablet 0  . budesonide-formoterol (SYMBICORT) 160-4.5 MCG/ACT inhaler Inhale 2 puffs into the lungs 2 (two) times daily. 3 Inhaler 3  . Cholecalciferol (D3-1000 PO) Take 1 tablet by mouth daily.    . Cyanocobalamin (B-12 PO) Take 1 tablet by mouth daily.    . fluticasone (FLONASE) 50 MCG/ACT nasal spray USE 2 SPRAYS NASALLY DAILY 48 g 3  . furosemide (LASIX) 20 MG tablet Take 1 tablet (20 mg total) by mouth daily. 90 tablet 3  . gabapentin (NEURONTIN) 600 MG tablet Take 600 mg by  mouth 3 (three) times daily.     Marland Kitchen HYDROcodone-acetaminophen (NORCO/VICODIN) 5-325 MG tablet Take 1 tablet by mouth as needed for moderate pain.    Marland Kitchen insulin glargine (LANTUS) 100 UNIT/ML injection Inject 100 Units into the skin 2 (two) times daily.     Marland Kitchen levothyroxine (SYNTHROID, LEVOTHROID) 200 MCG tablet Take 200 mcg by mouth daily. Alternates with 175 mcg every other day    . LORazepam (ATIVAN) 2 MG tablet Take 2 mg by mouth daily as needed. FOR DEPRESSION    . meclizine (ANTIVERT) 25 MG tablet Take 25 mg by mouth 2 (two) times daily as needed for dizziness.     . metFORMIN (GLUCOPHAGE) 500 MG tablet Take 1,000 mg by mouth 2 (two) times daily with a meal.    . Naphazoline-Pheniramine (EYE ALLERGY RELIEF OP) Apply 1-2 drops to eye 2 (two) times daily as needed (allergy eyes).     Marland Kitchen omeprazole (PRILOSEC) 20 MG capsule Take 20 mg by mouth daily.    Marland Kitchen PHENADOZ 25 MG suppository Place 2.5 mg rectally daily as needed. (FOR HEADACHES)    . simvastatin (ZOCOR) 20 MG tablet Take 1 tablet by mouth daily.    . traMADol (ULTRAM) 50 MG tablet Take 50 mg by mouth every 12 (twelve) hours as needed for moderate pain.    Marland Kitchen VALSARTAN-HYDROCHLOROTHIAZIDE PO Take by mouth.    . nitroGLYCERIN (NITROSTAT) 0.4 MG SL tablet Place 1 tablet (0.4 mg total) under the tongue every 5 (five) minutes as needed for chest pain. (Patient not taking: Reported on 03/23/2016) 25 tablet 3   No current facility-administered medications for this visit.     Allergies as of 03/23/2016  . (No Known Allergies)    Family History  Problem Relation Age of Onset  . Heart Problems Mother   . Heart defect Mother     valve problems  . Heart Problems Father   . Heart disease Brother   . Lung cancer Daughter     non small cell   . Diabetes Maternal Aunt   . Diabetes Maternal Uncle     Social History   Social History  . Marital status: Married    Spouse name: Jeneen Rinks  . Number of children: 3  . Years of education: 12    Occupational History  . retired    Social History Main Topics  . Smoking status: Former Smoker    Packs/day: 1.50    Years: 34.00    Types: Cigarettes    Quit date: 05/04/1997  . Smokeless tobacco: Never Used  . Alcohol use No  . Drug use: No  . Sexual activity: Not on file  Other Topics Concern  . Not on file   Social History Narrative   Patient lives at home with her husband Jeneen Rinks .   Retired.   Education 12 th grade.   Right handed   Caffeine two cups of coffee.     Physical Exam: BP (!) 146/70 (BP Location: Left Arm, Patient Position: Sitting, Cuff Size: Normal)   Pulse 68   Ht 5\' 5"  (1.651 m) Comment: height measured without shoes  Wt 231 lb 8 oz (105 kg)   BMI 38.52 kg/m  Constitutional: generally well-appearing Psychiatric: alert and oriented x3 Eyes: extraocular movements intact Mouth: oral pharynx moist, no lesions Neck: supple no lymphadenopathy Cardiovascular: heart regular rate and rhythm Lungs: clear to auscultation bilaterally Abdomen: softvery mild right upper quadrant tenderness, no obvious ascites, no peritoneal signs, normal bowel sounds Extremities: no lower extremity edema bilaterally Skin: no lesions on visible extremities   Assessment and plan: 75 y.o. female with  right upper quadrant pain, mild right upper quadrant tenderness  she tells the outside ultrasound was normal. She also had blood work done last week. We have none of those records here for review to call back to her primary care office in [place] to get all those records sent here for review. I do believe that her ultrasound was not revealing and since she does still have some significant pain and mildly tender I recommended we proceed with further imaging testing and we will therefore arrange for CT scan abdomen and pelvis to be done for her right upper quadrant abdominal pain.   Owens Loffler, MD [facility] 03/23/2016, 2:13 PM  Cc: [name], MD

## 2016-03-24 ENCOUNTER — Ambulatory Visit (INDEPENDENT_AMBULATORY_CARE_PROVIDER_SITE_OTHER)
Admission: RE | Admit: 2016-03-24 | Discharge: 2016-03-24 | Disposition: A | Discharge status: Home / Self Care | Payer: Commercial Managed Care - HMO | Source: Ambulatory Visit | Attending: Gastroenterology | Admitting: Gastroenterology

## 2016-03-24 DIAGNOSIS — R1011 Right upper quadrant pain: Secondary | ICD-10-CM

## 2016-03-24 DIAGNOSIS — K5790 Diverticulosis of intestine, part unspecified, without perforation or abscess without bleeding: Secondary | ICD-10-CM | POA: Diagnosis not present

## 2016-03-24 MED ORDER — IOPAMIDOL (ISOVUE-300) INJECTION 61%
100.0000 mL | Freq: Once | INTRAVENOUS | Status: AC | PRN
  Administered 2016-03-24: 100 mL via INTRAVENOUS

## 2016-03-25 ENCOUNTER — Other Ambulatory Visit: Payer: Self-pay

## 2016-03-25 DIAGNOSIS — R1084 Generalized abdominal pain: Secondary | ICD-10-CM

## 2016-04-01 ENCOUNTER — Telehealth: Payer: Self-pay | Admitting: Gastroenterology

## 2016-04-01 NOTE — Telephone Encounter (Signed)
Pt has been notified of the phone number for [facility] radiology to see if they have a sooner appt.

## 2016-04-06 ENCOUNTER — Encounter (HOSPITAL_COMMUNITY)
Admission: RE | Admit: 2016-04-06 | Discharge: 2016-04-06 | Disposition: A | Discharge status: Home / Self Care | Payer: Commercial Managed Care - HMO | Source: Ambulatory Visit | Attending: Gastroenterology | Admitting: Gastroenterology

## 2016-04-06 DIAGNOSIS — R1011 Right upper quadrant pain: Secondary | ICD-10-CM | POA: Diagnosis not present

## 2016-04-06 DIAGNOSIS — R1084 Generalized abdominal pain: Secondary | ICD-10-CM | POA: Diagnosis not present

## 2016-04-06 MED ORDER — TECHNETIUM TC 99M MEBROFENIN IV KIT
5.3000 | PACK | Freq: Once | INTRAVENOUS | Status: AC | PRN
  Administered 2016-04-06: 5.3 via INTRAVENOUS

## 2016-04-07 ENCOUNTER — Ambulatory Visit: Payer: Commercial Managed Care - HMO | Admitting: Family Medicine

## 2016-04-07 ENCOUNTER — Telehealth: Payer: Self-pay | Admitting: Gastroenterology

## 2016-04-07 ENCOUNTER — Other Ambulatory Visit: Payer: Self-pay

## 2016-04-07 MED ORDER — HYDROCODONE-ACETAMINOPHEN 5-325 MG PO TABS: 1.0000 | ORAL_TABLET | Freq: Two times a day (BID) | ORAL | 0 refills | Status: DC | PRN

## 2016-04-07 NOTE — Telephone Encounter (Signed)
See results note from HIDA scan

## 2016-04-07 NOTE — Telephone Encounter (Signed)
Pt states she is still having a lot of abdominal pain. Calling for her HIDA results. Please advise.

## 2016-04-13 ENCOUNTER — Encounter: Payer: Commercial Managed Care - HMO | Admitting: Gastroenterology

## 2016-04-16 DIAGNOSIS — K828 Other specified diseases of gallbladder: Secondary | ICD-10-CM | POA: Diagnosis not present

## 2016-04-17 ENCOUNTER — Telehealth: Payer: Self-pay | Admitting: Internal Medicine

## 2016-04-17 NOTE — Telephone Encounter (Signed)
Forward to Dr. Marlou Porch

## 2016-04-17 NOTE — Telephone Encounter (Signed)
Request for surgical clearance:  1. What type of surgery is being performed?  lapcoly  2. When is this surgery scheduled? unknown  3. Are there any medications that need to be held prior to surgery and how long? asprin  Name of physician performing surgery? Dr. Redmond Pulling 4. What is your office phone and fax number? [phone]

## 2016-04-18 NOTE — Telephone Encounter (Signed)
OK to proceed with Lap Chole with low overall cardiac risk. She may hold ASA for 5 days prior if necessary.  Candee Furbish, MD

## 2016-04-20 DIAGNOSIS — I10 Essential (primary) hypertension: Secondary | ICD-10-CM | POA: Diagnosis not present

## 2016-04-20 DIAGNOSIS — E1149 Type 2 diabetes mellitus with other diabetic neurological complication: Secondary | ICD-10-CM | POA: Diagnosis not present

## 2016-04-20 DIAGNOSIS — E782 Mixed hyperlipidemia: Secondary | ICD-10-CM | POA: Diagnosis not present

## 2016-04-20 DIAGNOSIS — Z9181 History of falling: Secondary | ICD-10-CM | POA: Diagnosis not present

## 2016-04-20 DIAGNOSIS — Z6837 Body mass index (BMI) 37.0-37.9, adult: Secondary | ICD-10-CM | POA: Diagnosis not present

## 2016-04-20 DIAGNOSIS — E1165 Type 2 diabetes mellitus with hyperglycemia: Secondary | ICD-10-CM | POA: Diagnosis not present

## 2016-04-20 DIAGNOSIS — E559 Vitamin D deficiency, unspecified: Secondary | ICD-10-CM | POA: Diagnosis not present

## 2016-04-20 NOTE — Telephone Encounter (Signed)
Printed and taken to MR to be faxed 

## 2016-04-23 ENCOUNTER — Encounter: Payer: Self-pay | Admitting: *Deleted

## 2016-04-23 ENCOUNTER — Telehealth: Payer: Self-pay | Admitting: Internal Medicine

## 2016-04-23 ENCOUNTER — Telehealth: Payer: Self-pay | Admitting: Cardiology

## 2016-04-23 NOTE — Telephone Encounter (Signed)
New Message  Pt call requesting to speak with RN to f/u on surgical request. Please call back to discuss

## 2016-04-23 NOTE — Telephone Encounter (Signed)
Pt called stating that [facility] will not do her surgery until she has her PPM checked. Informed pt that I would talk to [facility] and figure out why she needed it checked because our protocol is that as long as she is following up properly which she is we can give recommendations for a surgery. Pt verbalized understanding. Called and LMOVM for Santiago Glad at [facility] to call back.

## 2016-04-23 NOTE — Telephone Encounter (Signed)
I called and spoke with the patient. She is aware that I have faxed her clearance over to [facility] - Dr. Redmond Pulling this morning.  Fax #- [phone] Confirmation received.

## 2016-04-24 NOTE — Progress Notes (Signed)
Surgery on 05/08/16.  Preop on 12/28.  Need orders in EPIC.  Thank You

## 2016-04-28 ENCOUNTER — Encounter (HOSPITAL_COMMUNITY): Payer: Self-pay | Admitting: Emergency Medicine

## 2016-04-28 ENCOUNTER — Observation Stay (HOSPITAL_COMMUNITY)
Admission: EM | Admit: 2016-04-28 | Discharge: 2016-04-30 | Disposition: A | Discharge status: Home / Self Care | Payer: Commercial Managed Care - HMO | Attending: Surgery | Admitting: Surgery

## 2016-04-28 DIAGNOSIS — K219 Gastro-esophageal reflux disease without esophagitis: Secondary | ICD-10-CM | POA: Diagnosis not present

## 2016-04-28 DIAGNOSIS — Z6838 Body mass index (BMI) 38.0-38.9, adult: Secondary | ICD-10-CM | POA: Insufficient documentation

## 2016-04-28 DIAGNOSIS — K829 Disease of gallbladder, unspecified: Secondary | ICD-10-CM | POA: Diagnosis present

## 2016-04-28 DIAGNOSIS — K7581 Nonalcoholic steatohepatitis (NASH): Secondary | ICD-10-CM | POA: Diagnosis not present

## 2016-04-28 DIAGNOSIS — R69 Illness, unspecified: Secondary | ICD-10-CM | POA: Diagnosis not present

## 2016-04-28 DIAGNOSIS — E785 Hyperlipidemia, unspecified: Secondary | ICD-10-CM | POA: Insufficient documentation

## 2016-04-28 DIAGNOSIS — G4733 Obstructive sleep apnea (adult) (pediatric): Secondary | ICD-10-CM | POA: Diagnosis not present

## 2016-04-28 DIAGNOSIS — Z7982 Long term (current) use of aspirin: Secondary | ICD-10-CM | POA: Insufficient documentation

## 2016-04-28 DIAGNOSIS — E890 Postprocedural hypothyroidism: Secondary | ICD-10-CM | POA: Diagnosis not present

## 2016-04-28 DIAGNOSIS — Z9889 Other specified postprocedural states: Secondary | ICD-10-CM | POA: Diagnosis not present

## 2016-04-28 DIAGNOSIS — Z8719 Personal history of other diseases of the digestive system: Secondary | ICD-10-CM | POA: Diagnosis not present

## 2016-04-28 DIAGNOSIS — I10 Essential (primary) hypertension: Secondary | ICD-10-CM | POA: Insufficient documentation

## 2016-04-28 DIAGNOSIS — Z419 Encounter for procedure for purposes other than remedying health state, unspecified: Secondary | ICD-10-CM

## 2016-04-28 DIAGNOSIS — K819 Cholecystitis, unspecified: Secondary | ICD-10-CM | POA: Diagnosis present

## 2016-04-28 DIAGNOSIS — E119 Type 2 diabetes mellitus without complications: Secondary | ICD-10-CM | POA: Insufficient documentation

## 2016-04-28 DIAGNOSIS — Z794 Long term (current) use of insulin: Secondary | ICD-10-CM | POA: Insufficient documentation

## 2016-04-28 DIAGNOSIS — M1990 Unspecified osteoarthritis, unspecified site: Secondary | ICD-10-CM | POA: Diagnosis not present

## 2016-04-28 DIAGNOSIS — J449 Chronic obstructive pulmonary disease, unspecified: Secondary | ICD-10-CM | POA: Insufficient documentation

## 2016-04-28 DIAGNOSIS — K810 Acute cholecystitis: Secondary | ICD-10-CM

## 2016-04-28 DIAGNOSIS — I2510 Atherosclerotic heart disease of native coronary artery without angina pectoris: Secondary | ICD-10-CM | POA: Insufficient documentation

## 2016-04-28 DIAGNOSIS — Z950 Presence of cardiac pacemaker: Secondary | ICD-10-CM | POA: Insufficient documentation

## 2016-04-28 DIAGNOSIS — K811 Chronic cholecystitis: Secondary | ICD-10-CM | POA: Insufficient documentation

## 2016-04-28 DIAGNOSIS — M4802 Spinal stenosis, cervical region: Secondary | ICD-10-CM | POA: Insufficient documentation

## 2016-04-28 DIAGNOSIS — E669 Obesity, unspecified: Secondary | ICD-10-CM | POA: Insufficient documentation

## 2016-04-28 DIAGNOSIS — K828 Other specified diseases of gallbladder: Secondary | ICD-10-CM | POA: Diagnosis not present

## 2016-04-28 DIAGNOSIS — R1013 Epigastric pain: Secondary | ICD-10-CM | POA: Diagnosis not present

## 2016-04-28 HISTORY — DX: Sleep apnea, unspecified: G47.30

## 2016-04-28 HISTORY — DX: Disease of gallbladder, unspecified: K82.9

## 2016-04-28 LAB — URINALYSIS, ROUTINE W REFLEX MICROSCOPIC
Bacteria, UA: NONE SEEN
Bilirubin Urine: NEGATIVE
Glucose, UA: NEGATIVE mg/dL
Hgb urine dipstick: NEGATIVE
Ketones, ur: NEGATIVE mg/dL
Leukocytes, UA: NEGATIVE
Nitrite: NEGATIVE
Protein, ur: 100 mg/dL — AB
Specific Gravity, Urine: 1.014 (ref 1.005–1.030)
pH: 5 (ref 5.0–8.0)

## 2016-04-28 LAB — CBC
HCT: 39.4 % (ref 36.0–46.0)
Hemoglobin: 12.7 g/dL (ref 12.0–15.0)
MCH: 28.7 pg (ref 26.0–34.0)
MCHC: 32.2 g/dL (ref 30.0–36.0)
MCV: 89.1 fL (ref 78.0–100.0)
Platelets: 392 10*3/uL (ref 150–400)
RBC: 4.42 MIL/uL (ref 3.87–5.11)
RDW: 13.5 % (ref 11.5–15.5)
WBC: 9.7 10*3/uL (ref 4.0–10.5)

## 2016-04-28 LAB — GLUCOSE, CAPILLARY: Glucose-Capillary: 128 mg/dL — ABNORMAL HIGH (ref 65–99)

## 2016-04-28 LAB — COMPREHENSIVE METABOLIC PANEL
ALT: 16 U/L (ref 14–54)
AST: 22 U/L (ref 15–41)
Albumin: 4.1 g/dL (ref 3.5–5.0)
Alkaline Phosphatase: 84 U/L (ref 38–126)
Anion gap: 9 (ref 5–15)
BUN: 26 mg/dL — ABNORMAL HIGH (ref 6–20)
CO2: 24 mmol/L (ref 22–32)
Calcium: 9.7 mg/dL (ref 8.9–10.3)
Chloride: 107 mmol/L (ref 101–111)
Creatinine, Ser: 1.1 mg/dL — ABNORMAL HIGH (ref 0.44–1.00)
GFR calc Af Amer: 55 mL/min — ABNORMAL LOW (ref >60)
GFR calc non Af Amer: 48 mL/min — ABNORMAL LOW (ref >60)
Glucose, Bld: 101 mg/dL — ABNORMAL HIGH (ref 65–99)
Potassium: 4.4 mmol/L (ref 3.5–5.1)
Sodium: 140 mmol/L (ref 135–145)
Total Bilirubin: 0.3 mg/dL (ref 0.3–1.2)
Total Protein: 8 g/dL (ref 6.5–8.1)

## 2016-04-28 LAB — LIPASE, BLOOD: Lipase: 24 U/L (ref 11–51)

## 2016-04-28 MED ORDER — POTASSIUM CHLORIDE IN NACL 20-0.45 MEQ/L-% IV SOLN
INTRAVENOUS | Status: DC
  Administered 2016-04-28 – 2016-04-29 (×3): via INTRAVENOUS
  Filled 2016-04-28 (×4): qty 1000

## 2016-04-28 MED ORDER — AMLODIPINE BESYLATE 5 MG PO TABS: 5.0000 mg | ORAL_TABLET | Freq: Every day | ORAL | Status: DC

## 2016-04-28 MED ORDER — MOMETASONE FURO-FORMOTEROL FUM 200-5 MCG/ACT IN AERO
2.0000 | INHALATION_SPRAY | Freq: Two times a day (BID) | RESPIRATORY_TRACT | Status: DC
  Administered 2016-04-28 – 2016-04-29 (×2): 2 via RESPIRATORY_TRACT
  Filled 2016-04-28: qty 8.8

## 2016-04-28 MED ORDER — HEPARIN SODIUM (PORCINE) 5000 UNIT/ML IJ SOLN
5000.0000 [IU] | Freq: Three times a day (TID) | INTRAMUSCULAR | Status: DC
  Administered 2016-04-28 – 2016-04-30 (×3): 5000 [IU] via SUBCUTANEOUS
  Filled 2016-04-28 (×3): qty 1

## 2016-04-28 MED ORDER — HYDROMORPHONE HCL 2 MG/ML IJ SOLN
0.5000 mg | Freq: Once | INTRAMUSCULAR | Status: AC
  Administered 2016-04-28: 0.5 mg via INTRAVENOUS
  Filled 2016-04-28: qty 1

## 2016-04-28 MED ORDER — LEVOTHYROXINE SODIUM 100 MCG PO TABS
200.0000 ug | ORAL_TABLET | Freq: Every day | ORAL | Status: DC
  Administered 2016-04-30: 200 ug via ORAL
  Filled 2016-04-28: qty 2

## 2016-04-28 MED ORDER — ALBUTEROL SULFATE (2.5 MG/3ML) 0.083% IN NEBU: 3.0000 mL | INHALATION_SOLUTION | Freq: Four times a day (QID) | RESPIRATORY_TRACT | Status: DC | PRN

## 2016-04-28 MED ORDER — HYDROCODONE-ACETAMINOPHEN 5-325 MG PO TABS
1.0000 | ORAL_TABLET | ORAL | Status: DC | PRN
  Administered 2016-04-28: 1 via ORAL
  Administered 2016-04-29 – 2016-04-30 (×4): 2 via ORAL
  Filled 2016-04-28 (×3): qty 2
  Filled 2016-04-28: qty 1
  Filled 2016-04-28 (×2): qty 2

## 2016-04-28 MED ORDER — ALBUTEROL SULFATE (2.5 MG/3ML) 0.083% IN NEBU: 2.5000 mg | INHALATION_SOLUTION | Freq: Four times a day (QID) | RESPIRATORY_TRACT | Status: DC | PRN

## 2016-04-28 MED ORDER — SODIUM CHLORIDE 0.9 % IV BOLUS (SEPSIS)
1000.0000 mL | Freq: Once | INTRAVENOUS | Status: AC
  Administered 2016-04-28: 1000 mL via INTRAVENOUS

## 2016-04-28 MED ORDER — METFORMIN HCL 500 MG PO TABS
1000.0000 mg | ORAL_TABLET | Freq: Two times a day (BID) | ORAL | Status: DC
  Administered 2016-04-30: 1000 mg via ORAL
  Filled 2016-04-28: qty 2

## 2016-04-28 MED ORDER — INSULIN ASPART 100 UNIT/ML ~~LOC~~ SOLN
0.0000 [IU] | SUBCUTANEOUS | Status: DC
  Administered 2016-04-28: 3 [IU] via SUBCUTANEOUS
  Administered 2016-04-29: 7 [IU] via SUBCUTANEOUS
  Administered 2016-04-29: 4 [IU] via SUBCUTANEOUS
  Administered 2016-04-30 (×2): 3 [IU] via SUBCUTANEOUS
  Administered 2016-04-30: 4 [IU] via SUBCUTANEOUS

## 2016-04-28 MED ORDER — MORPHINE SULFATE (PF) 2 MG/ML IV SOLN
1.0000 mg | INTRAVENOUS | Status: DC | PRN
  Administered 2016-04-28: 2 mg via INTRAVENOUS
  Administered 2016-04-29: 1 mg via INTRAVENOUS
  Administered 2016-04-29: 2 mg via INTRAVENOUS
  Administered 2016-04-29: 1 mg via INTRAVENOUS
  Administered 2016-04-29 – 2016-04-30 (×3): 2 mg via INTRAVENOUS
  Administered 2016-04-30: 3 mg via INTRAVENOUS
  Filled 2016-04-28 (×4): qty 1
  Filled 2016-04-28: qty 2
  Filled 2016-04-28 (×4): qty 1

## 2016-04-28 MED ORDER — NAPROXEN 500 MG PO TABS
500.0000 mg | ORAL_TABLET | Freq: Two times a day (BID) | ORAL | Status: DC | PRN
  Filled 2016-04-28: qty 1

## 2016-04-28 MED ORDER — ONDANSETRON 4 MG PO TBDP: 4.0000 mg | ORAL_TABLET | Freq: Four times a day (QID) | ORAL | Status: DC | PRN

## 2016-04-28 MED ORDER — ONDANSETRON HCL 4 MG/2ML IJ SOLN
4.0000 mg | Freq: Four times a day (QID) | INTRAMUSCULAR | Status: DC | PRN
  Administered 2016-04-28: 4 mg via INTRAVENOUS
  Filled 2016-04-28 (×2): qty 2

## 2016-04-28 MED ORDER — SODIUM CHLORIDE 0.9 % IV SOLN
Freq: Once | INTRAVENOUS | Status: AC
  Administered 2016-04-28: 17:00:00 via INTRAVENOUS

## 2016-04-28 MED ORDER — METOCLOPRAMIDE HCL 5 MG/ML IJ SOLN
5.0000 mg | Freq: Once | INTRAMUSCULAR | Status: AC
  Administered 2016-04-28: 5 mg via INTRAVENOUS
  Filled 2016-04-28: qty 2

## 2016-04-28 NOTE — Patient Instructions (Addendum)
Kayla [name]  04/28/2016   Your procedure is scheduled on: FRIDAY 05/08/2016  Report to [facility]  Entrance take [facility]  elevators to 3rd floor to  [place] at  [place] AM.  Call this number if you have problems the morning of surgery [phone]   Remember: ONLY 1 PERSON MAY GO WITH YOU TO SHORT STAY TO GET  READY MORNING OF [place].     Do not eat food or drink liquids :After Midnight.  How to Manage Your Diabetes Before and After Surgery  Why is it important to control my blood sugar before and after surgery? . Improving blood sugar levels before and after surgery helps healing and can limit problems. . A way of improving blood sugar control is eating a healthy diet by: o  Eating less sugar and carbohydrates o  Increasing activity/exercise o  Talking with your doctor about reaching your blood sugar goals . High blood sugars (greater than 180 mg/dL) can raise your risk of infections and slow your recovery, so you will need to focus on controlling your diabetes during the weeks before surgery. . Make sure that the doctor who takes care of your diabetes knows about your planned surgery including the date and location.  How do I manage my blood sugar before surgery? . Check your blood sugar at least 4 times a day, starting 2 days before surgery, to make sure that the level is not too high or low. o Check your blood sugar the morning of your surgery when you wake up and every 2 hours until you get to the Short Stay unit. . If your blood sugar is less than 70 mg/dL, you will need to treat for low blood sugar: o Do not take insulin. o Treat a low blood sugar (less than 70 mg/dL) with  cup of clear juice (cranberry or apple), 4 glucose tablets, OR glucose gel. o Recheck blood sugar in 15 minutes after treatment (to make sure it is greater than 70 mg/dL). If your blood sugar is not greater than 70 mg/dL on recheck, call [phone] for further  instructions. . Report your blood sugar to the short stay nurse when you get to Short Stay.  . If you are admitted to the hospital after surgery: o Your blood sugar will be checked by the staff and you will probably be given insulin after surgery (instead of oral diabetes medicines) to make sure you have good blood sugar levels. o The goal for blood sugar control after surgery is 80-180 mg/dL.   WHAT DO I DO ABOUT MY DIABETES MEDICATION?  Marland Kitchen Do not take oral diabetes medicines (pills) the morning of surgery.! .   . THE NIGHT BEFORE SURGERY, take _______50____ units of ________Lantus___insulin.       . THE MORNING OF SURGERY, take ______50_______ units of ___Lantus_______insulin.       Take these medicines the morning of surgery with A SIP OF WATER: use Albuterol inhaler and Symbicort inhaler if needed, Flonase nasal spray, Gabapentin (Neurontin), Amlodipine, Levothyroxine, Omeprazole,                                   You may not have any metal on your body including hair pins and              piercings  Do not wear jewelry, make-up, lotions, powders or perfumes, deodorant             Do not wear nail polish.  Do not shave  48 hours prior to surgery.              Men may shave face and neck.   Do not bring valuables to the hospital. [place].  Contacts, dentures or bridgework may not be worn into surgery.  Leave suitcase in the car. After surgery it may be brought to your room.                  Please read over the following fact sheets you were given: _____________________________________________________________________             [facility] - Preparing for Surgery Before surgery, you can play an important role.  Because skin is not sterile, your skin needs to be as free of germs as possible.  You can reduce the number of germs on your skin by washing with CHG (chlorahexidine gluconate) soap before surgery.  CHG is an  antiseptic cleaner which kills germs and bonds with the skin to continue killing germs even after washing. Please DO NOT use if you have an allergy to CHG or antibacterial soaps.  If your skin becomes reddened/irritated stop using the CHG and inform your nurse when you arrive at Short Stay. Do not shave (including legs and underarms) for at least 48 hours prior to the first CHG shower.  You may shave your face/neck. Please follow these instructions carefully:  1.  Shower with CHG Soap the night before surgery and the  morning of Surgery.  2.  If you choose to wash your hair, wash your hair first as usual with your  normal  shampoo.  3.  After you shampoo, rinse your hair and body thoroughly to remove the  shampoo.                           4.  Use CHG as you would any other liquid soap.  You can apply chg directly  to the skin and wash                       Gently with a scrungie or clean washcloth.  5.  Apply the CHG Soap to your body ONLY FROM THE NECK DOWN.   Do not use on face/ open                           Wound or open sores. Avoid contact with eyes, ears mouth and genitals (private parts).                       Wash face,  Genitals (private parts) with your normal soap.             6.  Wash thoroughly, paying special attention to the area where your surgery  will be performed.  7.  Thoroughly rinse your body with warm water from the neck down.  8.  DO NOT shower/wash with your normal soap after using and rinsing off  the CHG Soap.                9.  Pat yourself dry with  a clean towel.            10.  Wear clean pajamas.            11.  Place clean sheets on your bed the night of your first shower and do not  sleep with pets. Day of Surgery : Do not apply any lotions/deodorants the morning of surgery.  Please wear clean clothes to the hospital/surgery center.  FAILURE TO FOLLOW THESE INSTRUCTIONS MAY RESULT IN THE CANCELLATION OF YOUR SURGERY PATIENT  SIGNATURE_________________________________  NURSE SIGNATURE__________________________________  ________________________________________________________________________

## 2016-04-28 NOTE — ED Provider Notes (Signed)
[facility] DEPT Provider Note   CSN: [id number] Arrival date & time: 04/28/16  1008     History   Chief Complaint Chief Complaint  Patient presents with  . Abdominal Pain    HPI Kayla [name] is a 75 y.o. female.Complains of right upper quadrant pain rating to right flank constant since mid November 2017. However pain became much worse yesterday afternoon. Symptoms accompanied by multiple episodes of vomiting since yesterday afternoon. She denies fever. She tried to take hydrocodone however vomited the medicine shortly after taking it. She feels the pain is similar to "gallbladder pain" she's had in the past. Patient is scheduled for elective cholecystectomy 05/08/2016. Nothing makes symptoms better or worse. No other associated symptoms  HPI  Past Medical History:  Diagnosis Date  . Arthritis   . CAD (coronary artery disease)    Mild per remote cath in 2006, /    Nuclear, January, 2013, no significant abnormality,  . Carotid artery disease ([place])    Doppler, January, 2013, 0 94% RIC A., 17-40% LICA, stable  . Complication of anesthesia    wakes up "mean"  . COPD (chronic obstructive pulmonary disease) ([place])   . Diabetes mellitus, type 2 ([place])   . Diverticula of colon   . Dizziness    occasional  . Ejection fraction   . GERD (gastroesophageal reflux disease)   . Goiter    hx of  . Headache(784.0)    migraine  . Hyperlipidemia   . Hypothyroidism   . Left ventricular hypertrophy   . Leg edema    occasional swelling  . Obesity   . OSA (obstructive sleep apnea)    cpap setting of 12  . PONV (postoperative nausea and vomiting)    severe after every surgery  . Pulmonary hypertension, secondary     Patient Active Problem List   Diagnosis Date Noted  . Sinus node dysfunction ([place]) 11/08/2015  . Ejection fraction   . Spinal stenosis of cervical region 11/07/2014  . Preoperative clearance 10/09/2014  . Carotid artery disease ([place])   . S/P left TKA 07/27/2011    . Dizzy spells 05/04/2011  . Palpitations 01/21/2011  . RHINITIS 07/21/2010  . Hyperlipidemia 11/24/2008  . OBESITY 11/24/2008  . Coronary atherosclerosis 11/24/2008  . VENTRICULAR HYPERTROPHY, LEFT 11/24/2008  . HYPOTHYROIDISM, POSTSURGICAL 06/04/2008  . HEADACHE 06/04/2008  . DM (diabetes mellitus) ([place]) 03/23/2007  . G E R D 03/23/2007  . DIVERTICULOSIS OF COLON 03/23/2007  . GENERALIZED OSTEOARTHROSIS UNSPECIFIED SITE 03/23/2007  . OBSTRUCTIVE SLEEP APNEA 02/16/2007  . Essential hypertension 02/16/2007  . COPD 02/16/2007    Past Surgical History:  Procedure Laterality Date  . ABDOMINAL HYSTERECTOMY     with appendectomy and colon polypectomy  . ANTERIOR CERVICAL DECOMP/DISCECTOMY FUSION N/A 11/07/2014   Procedure: Anterior Cervical diskectomy and fusion Cervical four-five, Cervical five-six, Cervical six-seven;  Surgeon: Kary Kos, MD;  Location: [facility];  Service: Neurosurgery;  Laterality: N/A;  . EP IMPLANTABLE DEVICE N/A 11/08/2015   Procedure: Pacemaker Implant;  Surgeon: Deboraha Sprang, MD;  Location: [facility];  Service: Cardiovascular;  Laterality: N/A;  . LUMBAR DISC SURGERY  1990's   x 2  . THYROIDECTOMY  2011  . TOTAL KNEE ARTHROPLASTY  07/27/2011   Procedure: TOTAL KNEE ARTHROPLASTY;  Surgeon: Mauri Pole, MD;  Location: [facility];  Service: Orthopedics;  Laterality: Left;    OB History    No data available       Home Medications  Prior to Admission medications   Medication Sig Start Date End Date Taking? Authorizing Provider  acetaminophen (TYLENOL) 500 MG tablet Take 1,000 mg by mouth every 8 (eight) hours as needed for mild pain or moderate pain.    Historical Provider, MD  albuterol (PROAIR HFA) 108 (90 Base) MCG/ACT inhaler Inhale 2 puffs into the lungs every 6 (six) hours as needed for wheezing or shortness of breath. 11/25/15   Chesley Mires, MD  albuterol (PROVENTIL) (2.5 MG/3ML) 0.083% nebulizer solution Take 2.5 mg by nebulization every  6 (six) hours as needed for wheezing or shortness of breath.    Historical Provider, MD  amLODipine (NORVASC) 5 MG tablet Take 5 mg by mouth daily.  08/13/11   Historical Provider, MD  aspirin 81 MG tablet Take 81 mg by mouth daily.    Historical Provider, MD  azithromycin (ZITHROMAX) 250 MG tablet 2 pills on day one, then 1 pill daily for next 4 days 03/09/16   Chesley Mires, MD  budesonide-formoterol (SYMBICORT) 160-4.5 MCG/ACT inhaler Inhale 2 puffs into the lungs 2 (two) times daily. 11/25/15   Chesley Mires, MD  Cholecalciferol (D3-1000 PO) Take 1 tablet by mouth daily.    Historical Provider, MD  Cyanocobalamin (B-12 PO) Take 1 tablet by mouth daily.    Historical Provider, MD  fluticasone (FLONASE) 50 MCG/ACT nasal spray USE 2 SPRAYS NASALLY DAILY 12/10/15   Chesley Mires, MD  furosemide (LASIX) 20 MG tablet Take 1 tablet (20 mg total) by mouth daily. 02/18/16 05/18/16  Deboraha Sprang, MD  gabapentin (NEURONTIN) 600 MG tablet Take 600 mg by mouth 3 (three) times daily.  03/10/13   Historical Provider, MD  HYDROcodone-acetaminophen (NORCO/VICODIN) 5-325 MG tablet Take 1 tablet by mouth as needed for moderate pain.    Historical Provider, MD  HYDROcodone-acetaminophen (NORCO/VICODIN) 5-325 MG tablet Take 1-2 tablets by mouth every 12 (twelve) hours as needed for moderate pain. 04/07/16   Milus Banister, MD  insulin glargine (LANTUS) 100 UNIT/ML injection Inject 100 Units into the skin 2 (two) times daily.     Historical Provider, MD  levothyroxine (SYNTHROID, LEVOTHROID) 200 MCG tablet Take 200 mcg by mouth daily. Alternates with 175 mcg every other day    Historical Provider, MD  LORazepam (ATIVAN) 2 MG tablet Take 2 mg by mouth daily as needed. FOR DEPRESSION 08/20/14   Historical Provider, MD  meclizine (ANTIVERT) 25 MG tablet Take 25 mg by mouth 2 (two) times daily as needed for dizziness.  10/18/13   Historical Provider, MD  metFORMIN (GLUCOPHAGE) 500 MG tablet Take 1,000 mg by mouth 2 (two) times daily  with a meal.    Historical Provider, MD  Naphazoline-Pheniramine (EYE ALLERGY RELIEF OP) Apply 1-2 drops to eye 2 (two) times daily as needed (allergy eyes).     Historical Provider, MD  nitroGLYCERIN (NITROSTAT) 0.4 MG SL tablet Place 1 tablet (0.4 mg total) under the tongue every 5 (five) minutes as needed for chest pain. Patient not taking: Reported on 03/23/2016 10/09/14 11/30/17  Imogene Burn, PA-C  omeprazole (PRILOSEC) 20 MG capsule Take 20 mg by mouth daily.    Historical Provider, MD  PHENADOZ 25 MG suppository Place 2.5 mg rectally daily as needed. (FOR HEADACHES) 09/26/14   Historical Provider, MD  simvastatin (ZOCOR) 20 MG tablet Take 1 tablet by mouth daily. 12/14/12   Historical Provider, MD  traMADol (ULTRAM) 50 MG tablet Take 50 mg by mouth every 12 (twelve) hours as needed for moderate pain.  Historical Provider, MD  VALSARTAN-HYDROCHLOROTHIAZIDE PO Take by mouth.    Historical Provider, MD    Family History Family History  Problem Relation Age of Onset  . Heart Problems Mother   . Heart defect Mother     valve problems  . Heart Problems Father   . Heart disease Brother   . Lung cancer Daughter     non small cell   . Diabetes Maternal Aunt   . Diabetes Maternal Uncle     Social History Social History  Substance Use Topics  . Smoking status: Former Smoker    Packs/day: 1.50    Years: 34.00    Types: Cigarettes    Quit date: 05/04/1997  . Smokeless tobacco: Never Used  . Alcohol use No     Allergies   Patient has no known allergies.   Review of Systems Review of Systems  Constitutional: Negative.   HENT: Negative.   Respiratory: Negative.   Cardiovascular: Negative.   Gastrointestinal: Positive for abdominal pain, nausea and vomiting.  Musculoskeletal: Negative.   Skin: Negative.   Allergic/Immunologic: Positive for immunocompromised state.       Diabetic  Neurological: Negative.   Psychiatric/Behavioral: Negative.   All other systems reviewed and  are negative.    Physical Exam Updated Vital Signs BP 155/63   Pulse 84   Temp 99.8 F (37.7 C) (Oral)   Resp 16   SpO2 97%   Physical Exam  Constitutional: She appears well-developed and well-nourished.  Appears well and comfortable nontoxic  HENT:  Head: Normocephalic and atraumatic.  Mucous membranes dry  Eyes: Conjunctivae are normal. Pupils are equal, round, and reactive to light.  Neck: Neck supple. No tracheal deviation present. No thyromegaly present.  Cardiovascular: Normal rate and regular rhythm.   No murmur heard. Pulmonary/Chest: Effort normal and breath sounds normal.  Abdominal: Soft. Bowel sounds are normal. She exhibits no distension. There is tenderness. There is guarding.  Obese Tender at right upper quadrant with voluntary guarding  Musculoskeletal: Normal range of motion. She exhibits no edema or tenderness.  Neurological: She is alert. Coordination normal.  Skin: Skin is warm and dry. No rash noted.  Psychiatric: She has a normal mood and affect.  Nursing note and vitals reviewed.    ED Treatments / Results  Labs (all labs ordered are listed, but only abnormal results are displayed) Labs Reviewed  COMPREHENSIVE METABOLIC PANEL - Abnormal; Notable for the following:       Result Value   Glucose, Bld 101 (*)    BUN 26 (*)    Creatinine, Ser 1.10 (*)    GFR calc non Af Amer 48 (*)    GFR calc Af Amer 55 (*)    All other components within normal limits  LIPASE, BLOOD  CBC  URINALYSIS, ROUTINE W REFLEX MICROSCOPIC    EKG  EKG Interpretation None       Radiology No results found.  Procedures Procedures (including critical care time)  Medications Ordered in ED Medications  sodium chloride 0.9 % bolus 1,000 mL (not administered)  0.9 %  sodium chloride infusion (not administered)  HYDROmorphone (DILAUDID) injection 0.5 mg (not administered)  metoCLOPramide (REGLAN) injection 5 mg (not administered)   Results for orders placed or  performed during the hospital encounter of 04/28/16  Lipase, blood  Result Value Ref Range   Lipase 24 11 - 51 U/L  Comprehensive metabolic panel  Result Value Ref Range   Sodium 140 135 - 145 mmol/L  Potassium 4.4 3.5 - 5.1 mmol/L   Chloride 107 101 - 111 mmol/L   CO2 24 22 - 32 mmol/L   Glucose, Bld 101 (H) 65 - 99 mg/dL   BUN 26 (H) 6 - 20 mg/dL   Creatinine, Ser 1.10 (H) 0.44 - 1.00 mg/dL   Calcium 9.7 8.9 - 10.3 mg/dL   Total Protein 8.0 6.5 - 8.1 g/dL   Albumin 4.1 3.5 - 5.0 g/dL   AST 22 15 - 41 U/L   ALT 16 14 - 54 U/L   Alkaline Phosphatase 84 38 - 126 U/L   Total Bilirubin 0.3 0.3 - 1.2 mg/dL   GFR calc non Af Amer 48 (L) >60 mL/min   GFR calc Af Amer 55 (L) >60 mL/min   Anion gap 9 5 - 15  CBC  Result Value Ref Range   WBC 9.7 4.0 - 10.5 K/uL   RBC 4.42 3.87 - 5.11 MIL/uL   Hemoglobin 12.7 12.0 - 15.0 g/dL   HCT 39.4 36.0 - 46.0 %   MCV 89.1 78.0 - 100.0 fL   MCH 28.7 26.0 - 34.0 pg   MCHC 32.2 30.0 - 36.0 g/dL   RDW 13.5 11.5 - 15.5 %   Platelets 392 150 - 400 K/uL  Urinalysis, Routine w reflex microscopic  Result Value Ref Range   Color, Urine YELLOW YELLOW   APPearance CLEAR CLEAR   Specific Gravity, Urine 1.014 1.005 - 1.030   pH 5.0 5.0 - 8.0   Glucose, UA NEGATIVE NEGATIVE mg/dL   Hgb urine dipstick NEGATIVE NEGATIVE   Bilirubin Urine NEGATIVE NEGATIVE   Ketones, ur NEGATIVE NEGATIVE mg/dL   Protein, ur 100 (A) NEGATIVE mg/dL   Nitrite NEGATIVE NEGATIVE   Leukocytes, UA NEGATIVE NEGATIVE   RBC / HPF 0-5 0 - 5 RBC/hpf   WBC, UA 0-5 0 - 5 WBC/hpf   Bacteria, UA NONE SEEN NONE SEEN   Squamous Epithelial / LPF 6-30 (A) NONE SEEN   Mucous PRESENT    Hyaline Casts, UA PRESENT    Nm Hepato W/eject Fract  Result Date: 04/06/2016 CLINICAL DATA:  75 year old female with right upper quadrant abdominal pain for 1 month. EXAM: NUCLEAR MEDICINE HEPATOBILIARY IMAGING WITH GALLBLADDER EF TECHNIQUE: Sequential images of the abdomen were obtained out to 60  minutes following intravenous administration of radiopharmaceutical. After oral ingestion of 8 ounces of Ensure, gallbladder ejection fraction was determined. RADIOPHARMACEUTICALS:  5.3 mCi Tc-89m Choletec IV COMPARISON:  03/24/2016 CT and 03/18/2016 ultrasound FINDINGS: Prompt uptake and biliary excretion of activity by the liver is seen. Gallbladder activity is visualized at 20 minutes, consistent with patency of cystic duct. Biliary activity passes into small bowel at 10-15 minutes, consistent with patent common bile duct. Calculated gallbladder ejection fraction is 18%. (Normal gallbladder ejection fraction with half-and-half is greater than 33%.). The patient experienced right upper quadrant abdominal pain with Ensure. IMPRESSION: Low gallbladder ejection fraction (18%) which can be seen with chronic cholecystitis/ biliary dysfunction. No evidence of acute cholecystitis/cystic duct obstruction. Electronically Signed   By: Margarette Canada M.D.   On: 04/06/2016 13:51    Initial Impression / Assessment and Plan / ED Course  I have reviewed the triage vital signs and the nursing notes.  Pertinent labs & imaging results that were available during my care of the patient were reviewed by me and considered in my medical decision making (see chart for details).  Clinical Course     3:50 PM feels improved after treatment with intravenous  fluids, intravenous opioids and antiemetics. I've consulted surgical services Dr. Lucia Gaskins who saw patient in the emergency department and made arrangements for admission and for surgery tomorrow  Final Clinical Impressions(s) / ED Diagnoses  Diagnosis acute cholecystitis Final diagnoses:  None    New Prescriptions New Prescriptions   No medications on file     Orlie Dakin, MD 04/28/16 1553

## 2016-04-28 NOTE — ED Triage Notes (Addendum)
Pt c/o emesis, abdominal pain onset 03/19/16, has been seen for same and diagnosed with "diseased gall bladder" and scheduled for surgery.

## 2016-04-28 NOTE — H&P (Signed)
Re:   Kayla [name] DOB:   February 17, 1941 MRN:   [id number]  ASSESSMENT AND PLAN: 1.  Gall bladder disease.  She is scheduled for surgery by Dr. Redmond Pulling in early 2018.  Will admit and plan surgery tomorrow.  I discussed with the patient the indications and risks of gall bladder surgery.  The primary risks of gall bladder surgery include, but are not limited to, bleeding, infection, common bile duct injury, and open surgery.  There is also the risk that the patient may have continued symptoms after surgery.  We discussed the typical post-operative recovery course. I tried to answer the patient's questions.  2.  Pacemaker   Placed by Dr. Caryl Comes - 11/08/2015 3.  CAD  "OK to proceed with Lap Chole with low overall cardiac risk. She may hold ASA for 5 days prior if necessary." Candee Furbish, MD - 04/18/2106 4.  COPD 5.  DM - Insulin dependent 6.  Recent cataract surgery 7.  Obese  Chief Complaint  Patient presents with  . Abdominal Pain   PHYSICIAN REQUESTING CONSULTATION: HAMRICK,MAURA L, MD  HISTORY OF PRESENT ILLNESS: Kayla [name] is a 75 y.o. (DOB: [birth year])  white female whose primary care physician is [facility] L, MD and comes to [facility] ER today for pain and N&V. Her husband is in the room with her.   She has had RUQ and epigastric pain for some time.  She has had nausea and vomiting with this.  It got bad enough that she called our office.  She was sent to the ER.  She was originally seen by Dr. Ardis Hughs.  And has seen Dr. Redmond Pulling in our office.  She had a colonoscopy around 2011 by a physician at [facility] (? Name).   From Dr. Dois Davenport note: She is referred by Dr Ardis Hughs for evaluation of RUQ pain and possible gallbladder issues. She states that she has had right upper quadrant pain for approximately one month. It is started intermittently but now it is always there. It is worse at times. It is worse after eating meals. It is associated with intermittent episodes of nausea and  vomiting. She had a CT scan which was unremarkable except for some diverticulosis. She then underwent a nuclear medicine scan which showed a gallbladder ejection fraction of 18%. She has had normal LFTs and a normal CBC. She denies any jaundice. She denies any acholic stools. The pain does radiate to her back. She has been having take hydrocodone at times for discomfort.   She had a pacemaker inserted in July 2017. She has also had recent cataract surgery about a month and a half ago. She denies any chest pain, chest pressure, shortness of breath, orthopnea, TIAs or amaurosis fugax."   Past Medical History:  Diagnosis Date  . Arthritis   . CAD (coronary artery disease)    Mild per remote cath in 2006, /    Nuclear, January, 2013, no significant abnormality,  . Carotid artery disease ([place])    Doppler, January, 2013, 0 78% RIC A., 67-67% LICA, stable  . Complication of anesthesia    wakes up "mean"  . COPD (chronic obstructive pulmonary disease) ([place])   . Diabetes mellitus, type 2 ([place])   . Diverticula of colon   . Dizziness    occasional  . Ejection fraction   . GERD (gastroesophageal reflux disease)   . Goiter    hx of  . Headache(784.0)    migraine  . Hyperlipidemia   . Hypothyroidism   .  Left ventricular hypertrophy   . Leg edema    occasional swelling  . Obesity   . OSA (obstructive sleep apnea)    cpap setting of 12  . PONV (postoperative nausea and vomiting)    severe after every surgery  . Pulmonary hypertension, secondary       Past Surgical History:  Procedure Laterality Date  . ABDOMINAL HYSTERECTOMY     with appendectomy and colon polypectomy  . ANTERIOR CERVICAL DECOMP/DISCECTOMY FUSION N/A 11/07/2014   Procedure: Anterior Cervical diskectomy and fusion Cervical four-five, Cervical five-six, Cervical six-seven;  Surgeon: Kary Kos, MD;  Location: [facility];  Service: Neurosurgery;  Laterality: N/A;  . EP IMPLANTABLE DEVICE N/A 11/08/2015   Procedure:  Pacemaker Implant;  Surgeon: Deboraha Sprang, MD;  Location: [facility] LAB;  Service: Cardiovascular;  Laterality: N/A;  . LUMBAR DISC SURGERY  1990's   x 2  . THYROIDECTOMY  2011  . TOTAL KNEE ARTHROPLASTY  07/27/2011   Procedure: TOTAL KNEE ARTHROPLASTY;  Surgeon: Mauri Pole, MD;  Location: [facility];  Service: Orthopedics;  Laterality: Left;      Current Facility-Administered Medications  Medication Dose Route Frequency Provider Last Rate Last Dose  . 0.9 %  sodium chloride infusion   Intravenous Once Orlie Dakin, MD      . HYDROmorphone (DILAUDID) injection 0.5 mg  0.5 mg Intravenous Once Orlie Dakin, MD      . metoCLOPramide (REGLAN) injection 5 mg  5 mg Intravenous Once Orlie Dakin, MD      . sodium chloride 0.9 % bolus 1,000 mL  1,000 mL Intravenous Once Orlie Dakin, MD       Current Outpatient Prescriptions  Medication Sig Dispense Refill  . acetaminophen (TYLENOL) 500 MG tablet Take 1,000 mg by mouth every 8 (eight) hours as needed for mild pain or moderate pain.    Marland Kitchen albuterol (PROAIR HFA) 108 (90 Base) MCG/ACT inhaler Inhale 2 puffs into the lungs every 6 (six) hours as needed for wheezing or shortness of breath. 3 Inhaler 3  . albuterol (PROVENTIL) (2.5 MG/3ML) 0.083% nebulizer solution Take 2.5 mg by nebulization every 6 (six) hours as needed for wheezing or shortness of breath.    Marland Kitchen amLODipine (NORVASC) 5 MG tablet Take 5 mg by mouth daily.     Marland Kitchen aspirin 81 MG tablet Take 81 mg by mouth daily.    Marland Kitchen azithromycin (ZITHROMAX) 250 MG tablet 2 pills on day one, then 1 pill daily for next 4 days 6 tablet 0  . budesonide-formoterol (SYMBICORT) 160-4.5 MCG/ACT inhaler Inhale 2 puffs into the lungs 2 (two) times daily. 3 Inhaler 3  . Cholecalciferol (D3-1000 PO) Take 1 tablet by mouth daily.    . Cyanocobalamin (B-12 PO) Take 1 tablet by mouth daily.    . fluticasone (FLONASE) 50 MCG/ACT nasal spray USE 2 SPRAYS NASALLY DAILY 48 g 3  . furosemide (LASIX) 20 MG tablet  Take 1 tablet (20 mg total) by mouth daily. 90 tablet 3  . gabapentin (NEURONTIN) 600 MG tablet Take 600 mg by mouth 3 (three) times daily.     Marland Kitchen HYDROcodone-acetaminophen (NORCO/VICODIN) 5-325 MG tablet Take 1 tablet by mouth as needed for moderate pain.    Marland Kitchen HYDROcodone-acetaminophen (NORCO/VICODIN) 5-325 MG tablet Take 1-2 tablets by mouth every 12 (twelve) hours as needed for moderate pain. 50 tablet 0  . insulin glargine (LANTUS) 100 UNIT/ML injection Inject 100 Units into the skin 2 (two) times daily.     Marland Kitchen  levothyroxine (SYNTHROID, LEVOTHROID) 200 MCG tablet Take 200 mcg by mouth daily. Alternates with 175 mcg every other day    . LORazepam (ATIVAN) 2 MG tablet Take 2 mg by mouth daily as needed. FOR DEPRESSION    . meclizine (ANTIVERT) 25 MG tablet Take 25 mg by mouth 2 (two) times daily as needed for dizziness.     . metFORMIN (GLUCOPHAGE) 500 MG tablet Take 1,000 mg by mouth 2 (two) times daily with a meal.    . Naphazoline-Pheniramine (EYE ALLERGY RELIEF OP) Apply 1-2 drops to eye 2 (two) times daily as needed (allergy eyes).     . nitroGLYCERIN (NITROSTAT) 0.4 MG SL tablet Place 1 tablet (0.4 mg total) under the tongue every 5 (five) minutes as needed for chest pain. (Patient not taking: Reported on 03/23/2016) 25 tablet 3  . omeprazole (PRILOSEC) 20 MG capsule Take 20 mg by mouth daily.    Marland Kitchen PHENADOZ 25 MG suppository Place 2.5 mg rectally daily as needed. (FOR HEADACHES)    . simvastatin (ZOCOR) 20 MG tablet Take 1 tablet by mouth daily.    . traMADol (ULTRAM) 50 MG tablet Take 50 mg by mouth every 12 (twelve) hours as needed for moderate pain.    Marland Kitchen VALSARTAN-HYDROCHLOROTHIAZIDE PO Take by mouth.       No Known Allergies  REVIEW OF SYSTEMS: Skin:  No history of rash.  No history of abnormal moles. Infection:  No history of hepatitis or HIV.  No history of MRSA. Neurologic:  History of back surgery. Anterior cervical [place] - 11/07/2014.  Lumbar disckectomy x 2 in the 1990's.   Has  neuropathy secondary to DM in her LE. Cardiac:  History of CAD.  Had pacemaker placed by Dr. Caryl Comes on 11/08/2015.  Clearance for surgery by Dr. Woodroe Mode. Pulmonary:  COPD  Endocrine:  Insulin dependent DM. No thyroid disease. Gastrointestinal:  See HPI Urologic:  No history of kidney stones.  No history of bladder infections. Musculoskeletal:  Left total knee Alvan Dame - 2013 Hematologic:  No bleeding disorder.  No history of anemia.  Not anticoagulated. Psycho-social:  The patient is oriented.   The patient has no obvious psychologic or social impairment to understanding our conversation and plan.  SOCIAL and FAMILY HISTORY: Married. Husband at bedside. Has 3 children:  38 yo, 50 yo and 11 yo.  Family history of everyone in her family has had gall bladder disease.  PHYSICAL EXAM: BP 155/63   Pulse 84   Temp 99.8 F (37.7 C) (Oral)   Resp 16   SpO2 97%   General: Obese WF who is alert and generally healthy appearing.  Skin:  Inspection and palpation - no mass or rash. Eyes:  Conjunctiva and lids unremarkable.            Pupils are equal Ears, Nose, Mouth, and Throat:  Ears and nose unremarkable            Lips and teeth are unremarable.  She does not look that dehydrated. Neck: Supple. No mass, trachea midline.  No thyroid mass. Lymph Nodes:  No supraclavicular, cervical, or inguinal nodes. Lungs: Normal respiratory effort.  Clear to auscultation and symmetric breath sounds.  Pacemaker left upper chest Heart:  Palpation of the heart is normal.            Auscultation: RRR. No murmur or rub.  Abdomen: Soft. No mass. No tenderness. No hernia.             Liver:  Edge at right costal margin.            Obese. Normal bowel sounds.   Rectal: Not done. Musculoskeletal:  Normal gait.            Good muscle strength and ROM  in upper and lower extremities.  Neurologic:  Grossly intact to motor and sensory function. Psychiatric: Normal judgement and insight. Behavior is normal.  DATA  REVIEWED, COUNSELING AND COORDINATION OF CARE: Epic notes reviewed. Counseling and coordination of care exceeded more than 50% of the time spent with patient. Total time spent with patient and charting: 45 minutes.  Alphonsa Overall, MD,  [facility], [address]  [place], [place]    [place] Phone:  [phone] FAX:  [phone]

## 2016-04-29 ENCOUNTER — Inpatient Hospital Stay (HOSPITAL_COMMUNITY): Payer: Commercial Managed Care - HMO | Admitting: Certified Registered Nurse Anesthetist

## 2016-04-29 ENCOUNTER — Inpatient Hospital Stay (HOSPITAL_COMMUNITY): Payer: Commercial Managed Care - HMO

## 2016-04-29 ENCOUNTER — Encounter (HOSPITAL_COMMUNITY)
Admission: EM | Disposition: A | Discharge status: Home / Self Care | Payer: Self-pay | Source: Home / Self Care | Attending: Emergency Medicine

## 2016-04-29 ENCOUNTER — Encounter (HOSPITAL_COMMUNITY): Payer: Self-pay | Admitting: Surgery

## 2016-04-29 DIAGNOSIS — E785 Hyperlipidemia, unspecified: Secondary | ICD-10-CM | POA: Diagnosis not present

## 2016-04-29 DIAGNOSIS — E1151 Type 2 diabetes mellitus with diabetic peripheral angiopathy without gangrene: Secondary | ICD-10-CM | POA: Diagnosis not present

## 2016-04-29 DIAGNOSIS — K811 Chronic cholecystitis: Secondary | ICD-10-CM

## 2016-04-29 DIAGNOSIS — K828 Other specified diseases of gallbladder: Secondary | ICD-10-CM | POA: Diagnosis not present

## 2016-04-29 DIAGNOSIS — K219 Gastro-esophageal reflux disease without esophagitis: Secondary | ICD-10-CM | POA: Diagnosis not present

## 2016-04-29 DIAGNOSIS — K7581 Nonalcoholic steatohepatitis (NASH): Secondary | ICD-10-CM | POA: Diagnosis not present

## 2016-04-29 DIAGNOSIS — R1011 Right upper quadrant pain: Secondary | ICD-10-CM | POA: Diagnosis not present

## 2016-04-29 DIAGNOSIS — R1013 Epigastric pain: Secondary | ICD-10-CM | POA: Diagnosis not present

## 2016-04-29 DIAGNOSIS — K3189 Other diseases of stomach and duodenum: Secondary | ICD-10-CM | POA: Diagnosis not present

## 2016-04-29 DIAGNOSIS — G4733 Obstructive sleep apnea (adult) (pediatric): Secondary | ICD-10-CM | POA: Diagnosis not present

## 2016-04-29 DIAGNOSIS — R933 Abnormal findings on diagnostic imaging of other parts of digestive tract: Secondary | ICD-10-CM

## 2016-04-29 DIAGNOSIS — I10 Essential (primary) hypertension: Secondary | ICD-10-CM | POA: Diagnosis not present

## 2016-04-29 DIAGNOSIS — M1990 Unspecified osteoarthritis, unspecified site: Secondary | ICD-10-CM | POA: Diagnosis not present

## 2016-04-29 DIAGNOSIS — K8020 Calculus of gallbladder without cholecystitis without obstruction: Secondary | ICD-10-CM | POA: Diagnosis not present

## 2016-04-29 DIAGNOSIS — E119 Type 2 diabetes mellitus without complications: Secondary | ICD-10-CM | POA: Diagnosis not present

## 2016-04-29 DIAGNOSIS — I2510 Atherosclerotic heart disease of native coronary artery without angina pectoris: Secondary | ICD-10-CM | POA: Diagnosis not present

## 2016-04-29 DIAGNOSIS — E890 Postprocedural hypothyroidism: Secondary | ICD-10-CM | POA: Diagnosis not present

## 2016-04-29 HISTORY — PX: CHOLECYSTECTOMY: SHX55

## 2016-04-29 LAB — GLUCOSE, CAPILLARY
Glucose-Capillary: 118 mg/dL — ABNORMAL HIGH (ref 65–99)
Glucose-Capillary: 138 mg/dL — ABNORMAL HIGH (ref 65–99)
Glucose-Capillary: 152 mg/dL — ABNORMAL HIGH (ref 65–99)
Glucose-Capillary: 214 mg/dL — ABNORMAL HIGH (ref 65–99)
Glucose-Capillary: 61 mg/dL — ABNORMAL LOW (ref 65–99)
Glucose-Capillary: 65 mg/dL (ref 65–99)
Glucose-Capillary: 87 mg/dL (ref 65–99)
Glucose-Capillary: 89 mg/dL (ref 65–99)

## 2016-04-29 LAB — MRSA PCR SCREENING: MRSA by PCR: NEGATIVE

## 2016-04-29 SURGERY — LAPAROSCOPIC CHOLECYSTECTOMY WITH INTRAOPERATIVE CHOLANGIOGRAM
Anesthesia: General | Site: Abdomen

## 2016-04-29 MED ORDER — ONDANSETRON HCL 4 MG/2ML IJ SOLN
INTRAMUSCULAR | Status: AC
  Filled 2016-04-29: qty 2

## 2016-04-29 MED ORDER — FENTANYL CITRATE (PF) 100 MCG/2ML IJ SOLN
INTRAMUSCULAR | Status: AC
  Filled 2016-04-29: qty 2

## 2016-04-29 MED ORDER — LIDOCAINE 2% (20 MG/ML) 5 ML SYRINGE
INTRAMUSCULAR | Status: DC | PRN
  Administered 2016-04-29: 80 mg via INTRAVENOUS

## 2016-04-29 MED ORDER — LACTATED RINGERS IV SOLN: INTRAVENOUS | Status: DC

## 2016-04-29 MED ORDER — BUPIVACAINE HCL (PF) 0.25 % IJ SOLN
INTRAMUSCULAR | Status: DC | PRN
  Administered 2016-04-29: 30 mL

## 2016-04-29 MED ORDER — SUCCINYLCHOLINE CHLORIDE 200 MG/10ML IV SOSY
PREFILLED_SYRINGE | INTRAVENOUS | Status: DC | PRN
  Administered 2016-04-29: 100 mg via INTRAVENOUS

## 2016-04-29 MED ORDER — LABETALOL HCL 5 MG/ML IV SOLN
INTRAVENOUS | Status: DC | PRN
  Administered 2016-04-29: 2.5 mg via INTRAVENOUS

## 2016-04-29 MED ORDER — MEPERIDINE HCL 50 MG/ML IJ SOLN: 6.2500 mg | INTRAMUSCULAR | Status: DC | PRN

## 2016-04-29 MED ORDER — SUGAMMADEX SODIUM 500 MG/5ML IV SOLN
INTRAVENOUS | Status: DC | PRN
  Administered 2016-04-29: 400 mg via INTRAVENOUS

## 2016-04-29 MED ORDER — SCOPOLAMINE 1 MG/3DAYS TD PT72
MEDICATED_PATCH | TRANSDERMAL | Status: DC | PRN
  Administered 2016-04-29: 1 via TRANSDERMAL

## 2016-04-29 MED ORDER — CEFAZOLIN SODIUM-DEXTROSE 2-3 GM-% IV SOLR
2.0000 g | Freq: Once | INTRAVENOUS | Status: AC
  Administered 2016-04-29: 2 g via INTRAVENOUS

## 2016-04-29 MED ORDER — DEXAMETHASONE SODIUM PHOSPHATE 10 MG/ML IJ SOLN
INTRAMUSCULAR | Status: AC
  Filled 2016-04-29: qty 1

## 2016-04-29 MED ORDER — ROCURONIUM BROMIDE 50 MG/5ML IV SOSY
PREFILLED_SYRINGE | INTRAVENOUS | Status: AC
  Filled 2016-04-29: qty 10

## 2016-04-29 MED ORDER — FENTANYL CITRATE (PF) 100 MCG/2ML IJ SOLN
25.0000 ug | INTRAMUSCULAR | Status: DC | PRN
  Administered 2016-04-29 (×2): 50 ug via INTRAVENOUS

## 2016-04-29 MED ORDER — CEFAZOLIN SODIUM-DEXTROSE 2-4 GM/100ML-% IV SOLN
INTRAVENOUS | Status: AC
  Filled 2016-04-29: qty 100

## 2016-04-29 MED ORDER — METOCLOPRAMIDE HCL 5 MG/ML IJ SOLN
10.0000 mg | Freq: Once | INTRAMUSCULAR | Status: AC | PRN
  Administered 2016-04-29: 10 mg via INTRAVENOUS

## 2016-04-29 MED ORDER — GLUCOSE 40 % PO GEL
ORAL | Status: AC
  Administered 2016-04-29: 37.5 g
  Filled 2016-04-29: qty 1

## 2016-04-29 MED ORDER — ALBUTEROL SULFATE HFA 108 (90 BASE) MCG/ACT IN AERS
INHALATION_SPRAY | RESPIRATORY_TRACT | Status: AC
  Filled 2016-04-29: qty 6.7

## 2016-04-29 MED ORDER — ROCURONIUM BROMIDE 50 MG/5ML IV SOSY
PREFILLED_SYRINGE | INTRAVENOUS | Status: DC | PRN
  Administered 2016-04-29 (×3): 10 mg via INTRAVENOUS
  Administered 2016-04-29: 50 mg via INTRAVENOUS

## 2016-04-29 MED ORDER — FENTANYL CITRATE (PF) 250 MCG/5ML IJ SOLN
INTRAMUSCULAR | Status: AC
  Filled 2016-04-29: qty 5

## 2016-04-29 MED ORDER — IOPAMIDOL (ISOVUE-300) INJECTION 61%
INTRAVENOUS | Status: DC | PRN
  Administered 2016-04-29: 50 mL via INTRAVENOUS

## 2016-04-29 MED ORDER — LIDOCAINE 2% (20 MG/ML) 5 ML SYRINGE
INTRAMUSCULAR | Status: AC
  Filled 2016-04-29: qty 10

## 2016-04-29 MED ORDER — BUPIVACAINE HCL (PF) 0.5 % IJ SOLN
INTRAMUSCULAR | Status: AC
  Filled 2016-04-29: qty 30

## 2016-04-29 MED ORDER — PROPOFOL 10 MG/ML IV BOLUS
INTRAVENOUS | Status: DC | PRN
  Administered 2016-04-29: 150 mg via INTRAVENOUS

## 2016-04-29 MED ORDER — DEXTROSE 50 % IV SOLN
INTRAVENOUS | Status: AC
  Administered 2016-04-29: 25 mL
  Filled 2016-04-29: qty 50

## 2016-04-29 MED ORDER — PROPOFOL 10 MG/ML IV BOLUS
INTRAVENOUS | Status: AC
  Filled 2016-04-29: qty 20

## 2016-04-29 MED ORDER — IOPAMIDOL (ISOVUE-300) INJECTION 61%
INTRAVENOUS | Status: AC
  Filled 2016-04-29: qty 50

## 2016-04-29 MED ORDER — LACTATED RINGERS IV SOLN
INTRAVENOUS | Status: DC | PRN
Start: 2016-04-29 — End: 2016-04-29
  Administered 2016-04-29: 10:00:00 via INTRAVENOUS

## 2016-04-29 MED ORDER — HYDRALAZINE HCL 20 MG/ML IJ SOLN
INTRAMUSCULAR | Status: DC | PRN
  Administered 2016-04-29: 2 mg via INTRAVENOUS

## 2016-04-29 MED ORDER — FENTANYL CITRATE (PF) 100 MCG/2ML IJ SOLN
INTRAMUSCULAR | Status: DC | PRN
  Administered 2016-04-29 (×8): 50 ug via INTRAVENOUS

## 2016-04-29 MED ORDER — LACTATED RINGERS IR SOLN
Status: DC | PRN
  Administered 2016-04-29: 3000 mL

## 2016-04-29 MED ORDER — LABETALOL HCL 5 MG/ML IV SOLN
INTRAVENOUS | Status: AC
  Filled 2016-04-29: qty 4

## 2016-04-29 MED ORDER — 0.9 % SODIUM CHLORIDE (POUR BTL) OPTIME
TOPICAL | Status: DC | PRN
  Administered 2016-04-29: 1000 mL

## 2016-04-29 MED ORDER — SUCCINYLCHOLINE CHLORIDE 200 MG/10ML IV SOSY
PREFILLED_SYRINGE | INTRAVENOUS | Status: AC
  Filled 2016-04-29: qty 10

## 2016-04-29 MED ORDER — SUGAMMADEX SODIUM 500 MG/5ML IV SOLN
INTRAVENOUS | Status: AC
  Filled 2016-04-29: qty 5

## 2016-04-29 MED ORDER — ONDANSETRON HCL 4 MG/2ML IJ SOLN
INTRAMUSCULAR | Status: DC | PRN
  Administered 2016-04-29 (×2): 4 mg via INTRAVENOUS

## 2016-04-29 MED ORDER — SCOPOLAMINE 1 MG/3DAYS TD PT72
MEDICATED_PATCH | TRANSDERMAL | Status: AC
  Filled 2016-04-29: qty 1

## 2016-04-29 MED ORDER — METOCLOPRAMIDE HCL 5 MG/ML IJ SOLN
INTRAMUSCULAR | Status: AC
  Filled 2016-04-29: qty 2

## 2016-04-29 MED ORDER — ALBUTEROL SULFATE HFA 108 (90 BASE) MCG/ACT IN AERS
INHALATION_SPRAY | RESPIRATORY_TRACT | Status: DC | PRN
  Administered 2016-04-29: 2 via RESPIRATORY_TRACT

## 2016-04-29 MED ORDER — HYDROMORPHONE HCL 1 MG/ML IJ SOLN
INTRAMUSCULAR | Status: AC
  Filled 2016-04-29: qty 1

## 2016-04-29 MED ORDER — DEXAMETHASONE SODIUM PHOSPHATE 10 MG/ML IJ SOLN
INTRAMUSCULAR | Status: DC | PRN
  Administered 2016-04-29: 5 mg via INTRAVENOUS

## 2016-04-29 MED ORDER — BUPIVACAINE HCL (PF) 0.25 % IJ SOLN
INTRAMUSCULAR | Status: AC
  Filled 2016-04-29: qty 30

## 2016-04-29 SURGICAL SUPPLY — 39 items
APPLIER CLIP 5 13 M/L LIGAMAX5 (MISCELLANEOUS)
APPLIER CLIP ROT 10 11.4 M/L (STAPLE) ×2
BENZOIN TINCTURE PRP APPL 2/3 (GAUZE/BANDAGES/DRESSINGS) IMPLANT
CABLE HIGH FREQUENCY MONO STRZ (ELECTRODE) ×2 IMPLANT
CHLORAPREP W/TINT 26ML (MISCELLANEOUS) ×2 IMPLANT
CHOLANGIOGRAM CATH TAUT (CATHETERS) ×2 IMPLANT
CLIP APPLIE 5 13 M/L LIGAMAX5 (MISCELLANEOUS) IMPLANT
CLIP APPLIE ROT 10 11.4 M/L (STAPLE) ×1 IMPLANT
COVER MAYO STAND STRL (DRAPES) ×2 IMPLANT
COVER SURGICAL LIGHT HANDLE (MISCELLANEOUS) ×2 IMPLANT
DECANTER SPIKE VIAL GLASS SM (MISCELLANEOUS) IMPLANT
DERMABOND ADVANCED (GAUZE/BANDAGES/DRESSINGS) ×1
DERMABOND ADVANCED .7 DNX12 (GAUZE/BANDAGES/DRESSINGS) ×1 IMPLANT
DRAPE C-ARM 42X120 X-RAY (DRAPES) ×2 IMPLANT
ELECT REM PT RETURN 9FT ADLT (ELECTROSURGICAL) ×2
ELECTRODE REM PT RTRN 9FT ADLT (ELECTROSURGICAL) ×1 IMPLANT
GLOVE SURG SIGNA 7.5 PF LTX (GLOVE) ×2 IMPLANT
GOWN STRL REUS W/TWL XL LVL3 (GOWN DISPOSABLE) ×10 IMPLANT
HEMOSTAT SURGICEL 4X8 (HEMOSTASIS) IMPLANT
IRRIG SUCT STRYKERFLOW 2 WTIP (MISCELLANEOUS)
IRRIGATION SUCT STRKRFLW 2 WTP (MISCELLANEOUS) IMPLANT
IV CATH 14GX2 1/4 (CATHETERS) ×2 IMPLANT
IV SET EXTENSION CATH 6 NF (IV SETS) ×2 IMPLANT
KIT BASIN OR (CUSTOM PROCEDURE TRAY) ×2 IMPLANT
POUCH SPECIMEN RETRIEVAL 10MM (ENDOMECHANICALS) ×2 IMPLANT
SCISSORS LAP 5X35 DISP (ENDOMECHANICALS) ×2 IMPLANT
SET IRRIG TUBING LAPAROSCOPIC (IRRIGATION / IRRIGATOR) ×2 IMPLANT
SLEEVE ADV FIXATION 5X100MM (TROCAR) ×2 IMPLANT
STOPCOCK 4 WAY LG BORE MALE ST (IV SETS) ×2 IMPLANT
STRIP CLOSURE SKIN 1/4X4 (GAUZE/BANDAGES/DRESSINGS) IMPLANT
SUT MNCRL AB 4-0 PS2 18 (SUTURE) ×2 IMPLANT
SYR 10ML ECCENTRIC (SYRINGE) ×2 IMPLANT
TOWEL OR 17X26 10 PK STRL BLUE (TOWEL DISPOSABLE) ×2 IMPLANT
TOWEL OR NON WOVEN STRL DISP B (DISPOSABLE) ×2 IMPLANT
TRAY LAPAROSCOPIC (CUSTOM PROCEDURE TRAY) ×2 IMPLANT
TROCAR ADV FIXATION 11X100MM (TROCAR) ×2 IMPLANT
TROCAR ADV FIXATION 5X100MM (TROCAR) ×2 IMPLANT
TROCAR XCEL BLUNT TIP 100MML (ENDOMECHANICALS) ×2 IMPLANT
TUBING INSUF HEATED (TUBING) ×2 IMPLANT

## 2016-04-29 NOTE — Progress Notes (Signed)
Pt will have RN contact RT when ready for CPAP-qhs.  RT will continue to monitor as needed.

## 2016-04-29 NOTE — Anesthesia Procedure Notes (Signed)
Procedure Name: Intubation Date/Time: 04/29/2016 11:05 AM Performed by: West Pugh Pre-anesthesia Checklist: Patient identified, Emergency Drugs available, Suction available, Patient being monitored and Timeout performed Patient Re-evaluated:Patient Re-evaluated prior to inductionOxygen Delivery Method: Circle system utilized Preoxygenation: Pre-oxygenation with 100% oxygen Intubation Type: IV induction, Rapid sequence and Cricoid Pressure applied Laryngoscope Size: Mac and 4 Grade View: Grade I Tube type: Oral Number of attempts: 1 Airway Equipment and Method: Stylet Placement Confirmation: ETT inserted through vocal cords under direct vision,  positive ETCO2,  CO2 detector and breath sounds checked- equal and bilateral Secured at: 22 cm Tube secured with: Tape Dental Injury: Teeth and Oropharynx as per pre-operative assessment

## 2016-04-29 NOTE — Progress Notes (Signed)
IV to right forearm, infiltrated.  This nurse assessed for new IV access, attempted to insert to left forearm, refuses to be inserted in forearm, requests IV "nurse" to place in hand.

## 2016-04-29 NOTE — Transfer of Care (Signed)
Immediate Anesthesia Transfer of Care Note  Patient: Kayla [name]  Procedure(s) Performed: Procedure(s): LAPAROSCOPIC CHOLECYSTECTOMY WITH INTRAOPERATIVE CHOLANGIOGRAM (N/A)  Patient Location: PACU  Anesthesia Type:General  Level of Consciousness:  sedated, patient cooperative and responds to stimulation  Airway & Oxygen Therapy:Patient Spontanous Breathing and Patient connected to face mask oxgen  Post-op Assessment:  Report given to PACU RN and Post -op Vital signs reviewed and stable  Post vital signs:  Reviewed and stable  Last Vitals:  Vitals:   04/29/16 0910 04/29/16 1308  BP: (!) 162/67   Pulse: 69 84  Resp: 16 (!) 33  Temp: 85.4 C     Complications: No apparent anesthesia complications

## 2016-04-29 NOTE — Anesthesia Postprocedure Evaluation (Signed)
Anesthesia Post Note  Patient: Kayla [name]  Procedure(s) Performed: Procedure(s) (LRB): LAPAROSCOPIC CHOLECYSTECTOMY WITH INTRAOPERATIVE CHOLANGIOGRAM (N/A)  Patient location during evaluation: PACU Anesthesia Type: General Level of consciousness: awake and alert Pain management: pain level controlled Vital Signs Assessment: post-procedure vital signs reviewed and stable Respiratory status: spontaneous breathing, nonlabored ventilation, respiratory function stable and patient connected to nasal cannula oxygen Cardiovascular status: blood pressure returned to baseline and stable Postop Assessment: no signs of nausea or vomiting Anesthetic complications: no       Last Vitals:  Vitals:   04/29/16 1343 04/29/16 1345  BP:  (!) 160/68  Pulse: 80 79  Resp: 16 12  Temp:      Last Pain:  Vitals:   04/29/16 1345  TempSrc:   PainSc: 6                  Montez Hageman

## 2016-04-29 NOTE — Consult Note (Signed)
Referring Provider: [facility] Surgery - Dr. Lucia Gaskins Primary Care Physician:  [name], MD Primary Gastroenterologist:   Owens Loffler, MD  Reason for Consultation:  Possible CBD stones  HPI: Kayla [name] is a 75 y.o. female who was evaluated by Dr. Ardis Hughs late November for RUQ pain and vomiting. HIDA suggested biliary dyskinesia. She was referred for Surgical opinion, saw Dr. Greer Pickerel who scheduled her for a cholecystectomy to be done early January. Patient presented to ED yesterday with worsening symptoms including right flank pain and vomiting. She was admitted, underwent lap cholecystectomy with IOC today by Dr. Lucia Gaskins.  IOC showed flow of contrast into CBD and also duodenum but there were some apparent non-obstructing filling defects in the distal CBD. Some liver changes seen around the gallbladder. Tissue was pale. Biopsies taken.    Past Medical History:  Diagnosis Date  . Arthritis   . CAD (coronary artery disease)    Mild per remote cath in 2006, /    Nuclear, January, 2013, no significant abnormality,  . Carotid artery disease ([place])    Doppler, January, 2013, 0 33% RIC A., 82-50% LICA, stable  . Complication of anesthesia    wakes up "mean"  . COPD (chronic obstructive pulmonary disease) ([place])   . Diabetes mellitus, type 2 ([place])   . Diverticula of colon   . Dizziness    occasional  . Ejection fraction   . GERD (gastroesophageal reflux disease)   . Goiter    hx of  . Headache(784.0)    migraine  . Hyperlipidemia   . Hypothyroidism   . Left ventricular hypertrophy   . Leg edema    occasional swelling  . Obesity   . OSA (obstructive sleep apnea)    cpap setting of 12  . PONV (postoperative nausea and vomiting)    severe after every surgery  . Pulmonary hypertension, secondary   . Sleep apnea     Past Surgical History:  Procedure Laterality Date  . ABDOMINAL HYSTERECTOMY     with appendectomy and colon polypectomy  . ANTERIOR CERVICAL  DECOMP/DISCECTOMY FUSION N/A 11/07/2014   Procedure: Anterior Cervical diskectomy and fusion Cervical four-five, Cervical five-six, Cervical six-seven;  Surgeon: Kary Kos, MD;  Location: [place] [facility];  Service: Neurosurgery;  Laterality: N/A;  . EP IMPLANTABLE DEVICE N/A 11/08/2015   Procedure: Pacemaker Implant;  Surgeon: Deboraha Sprang, MD;  Location: [place] [facility];  Service: Cardiovascular;  Laterality: N/A;  . LUMBAR DISC SURGERY  1990's   x 2  . THYROIDECTOMY  2011  . TOTAL KNEE ARTHROPLASTY  07/27/2011   Procedure: TOTAL KNEE ARTHROPLASTY;  Surgeon: Mauri Pole, MD;  Location: [facility];  Service: Orthopedics;  Laterality: Left;    Prior to Admission medications   Medication Sig Start Date End Date Taking? Authorizing Provider  acetaminophen (TYLENOL) 500 MG tablet Take 1,000 mg by mouth every 8 (eight) hours as needed for mild pain or moderate pain.   Yes Historical Provider, MD  albuterol (PROAIR HFA) 108 (90 Base) MCG/ACT inhaler Inhale 2 puffs into the lungs every 6 (six) hours as needed for wheezing or shortness of breath. 11/25/15  Yes Chesley Mires, MD  albuterol (PROVENTIL) (2.5 MG/3ML) 0.083% nebulizer solution Take 2.5 mg by nebulization every 6 (six) hours as needed for wheezing or shortness of breath.   Yes Historical Provider, MD  amLODipine (NORVASC) 5 MG tablet Take 5 mg by mouth daily.  08/13/11  Yes Historical Provider, MD  aspirin 81  MG tablet Take 81 mg by mouth daily.   Yes Historical Provider, MD  budesonide-formoterol (SYMBICORT) 160-4.5 MCG/ACT inhaler Inhale 2 puffs into the lungs 2 (two) times daily. 11/25/15  Yes Chesley Mires, MD  Cholecalciferol (D3-1000 PO) Take 1 tablet by mouth daily.   Yes Historical Provider, MD  Cyanocobalamin (B-12 PO) Take 1 tablet by mouth daily.   Yes Historical Provider, MD  fluticasone (FLONASE) 50 MCG/ACT nasal spray USE 2 SPRAYS NASALLY DAILY Patient taking differently: Place 2 sprays into both nostrils 2 (two) times daily.  12/10/15  Yes  Chesley Mires, MD  furosemide (LASIX) 20 MG tablet Take 1 tablet (20 mg total) by mouth daily. 02/18/16 05/18/16 Yes Deboraha Sprang, MD  gabapentin (NEURONTIN) 600 MG tablet Take 600 mg by mouth 3 (three) times daily.  03/10/13  Yes Historical Provider, MD  HYDROcodone-acetaminophen (NORCO/VICODIN) 5-325 MG tablet Take 1-2 tablets by mouth every 12 (twelve) hours as needed for moderate pain. 04/07/16  Yes Milus Banister, MD  insulin glargine (LANTUS) 100 UNIT/ML injection Inject 100 Units into the skin 2 (two) times daily.    Yes Historical Provider, MD  levothyroxine (SYNTHROID, LEVOTHROID) 200 MCG tablet Take 200 mcg by mouth daily.    Yes Historical Provider, MD  LORazepam (ATIVAN) 2 MG tablet Take 2 mg by mouth daily as needed for anxiety. FOR DEPRESSION 08/20/14  Yes Historical Provider, MD  metFORMIN (GLUCOPHAGE) 500 MG tablet Take 1,000 mg by mouth 2 (two) times daily with a meal.   Yes Historical Provider, MD  nitroGLYCERIN (NITROSTAT) 0.4 MG SL tablet Place 1 tablet (0.4 mg total) under the tongue every 5 (five) minutes as needed for chest pain. 10/09/14 11/30/17 Yes Imogene Burn, PA-C  omeprazole (PRILOSEC) 20 MG capsule Take 20 mg by mouth daily.   Yes Historical Provider, MD  PHENADOZ 25 MG suppository Place 2.5 mg rectally daily as needed (headaches).  09/26/14  Yes Historical Provider, MD  simvastatin (ZOCOR) 20 MG tablet Take 20 mg by mouth daily.  12/14/12  Yes Historical Provider, MD  traMADol (ULTRAM) 50 MG tablet Take 50 mg by mouth every 12 (twelve) hours as needed for moderate pain.   Yes Historical Provider, MD  valsartan-hydrochlorothiazide (DIOVAN-HCT) 320-25 MG tablet Take 1 tablet by mouth daily.   Yes Historical Provider, MD    Current Facility-Administered Medications  Medication Dose Route Frequency Provider Last Rate Last Dose  . 0.45 % NaCl with KCl 20 mEq / L infusion   Intravenous Continuous Alphonsa Overall, MD 100 mL/hr at 04/29/16 [phone]    . albuterol (PROVENTIL) (2.5  MG/3ML) 0.083% nebulizer solution 2.5 mg  2.5 mg Nebulization Q6H PRN Alphonsa Overall, MD      . albuterol (PROVENTIL) (2.5 MG/3ML) 0.083% nebulizer solution 3 mL  3 mL Inhalation Q6H PRN Alphonsa Overall, MD      . amLODipine (NORVASC) tablet 5 mg  5 mg Oral Daily Alphonsa Overall, MD      . fentaNYL (SUBLIMAZE) 100 MCG/2ML injection           . heparin injection 5,000 Units  5,000 Units Subcutaneous Q8H Alphonsa Overall, MD   5,000 Units at 04/28/16 2129  . HYDROcodone-acetaminophen (NORCO/VICODIN) 5-325 MG per tablet 1-2 tablet  1-2 tablet Oral Q4H PRN Alphonsa Overall, MD   1 tablet at 04/28/16 2124  . insulin aspart (novoLOG) injection 0-20 Units  0-20 Units Subcutaneous Q4H Alphonsa Overall, MD   3 Units at 04/28/16 2100  . levothyroxine (SYNTHROID, LEVOTHROID) tablet 200  mcg  200 mcg Oral QAC breakfast Alphonsa Overall, MD      . metFORMIN (GLUCOPHAGE) tablet 1,000 mg  1,000 mg Oral BID WC Alphonsa Overall, MD      . metoCLOPramide [facility]) 5 MG/ML injection           . mometasone-formoterol (DULERA) 200-5 MCG/ACT inhaler 2 puff  2 puff Inhalation BID Alphonsa Overall, MD   2 puff at 04/28/16 2131  . morphine 2 MG/ML injection 1-3 mg  1-3 mg Intravenous Q2H PRN Alphonsa Overall, MD   2 mg at 04/29/16 3662  . naproxen (NAPROSYN) tablet 500 mg  500 mg Oral BID PRN Alphonsa Overall, MD      . ondansetron (ZOFRAN-ODT) disintegrating tablet 4 mg  4 mg Oral Q6H PRN Alphonsa Overall, MD       Or  . ondansetron [facility] injection 4 mg  4 mg Intravenous Q6H PRN Alphonsa Overall, MD   4 mg at 04/28/16 2150    Allergies as of 04/28/2016  . (No Known Allergies)    Family History  Problem Relation Age of Onset  . Heart Problems Mother   . Heart defect Mother     valve problems  . Heart Problems Father   . Heart disease Brother   . Lung cancer Daughter     non small cell   . Diabetes Maternal Aunt   . Diabetes Maternal Uncle     Social History   Social History  . Marital status: Married    Spouse name: Jeneen Rinks  . Number of children: 3    . Years of education: 12   Occupational History  . retired    Social History Main Topics  . Smoking status: Former Smoker    Packs/day: 1.50    Years: 34.00    Types: Cigarettes    Quit date: 05/04/1997  . Smokeless tobacco: Never Used  . Alcohol use No  . Drug use: No  . Sexual activity: Not on file   Other Topics Concern  . Not on file   Social History Narrative   Patient lives at home with her husband Jeneen Rinks .   Retired.   Education 12 th grade.   Right handed   Caffeine two cups of coffee.    Review of Systems: All systems reviewed and negative except where noted in HPI.  Physical Exam: Vital signs in last 24 hours: Temp:  [97.4 F (36.3 C)-98.6 F (37 C)] 97.5 F (36.4 C) (12/27 1429) Pulse Rate:  [69-98] 98 (12/27 1429) Resp:  [12-33] 20 (12/27 1429) BP: (133-197)/(37-110) 157/54 (12/27 1429) SpO2:  [94 %-100 %] 98 % (12/27 1429) Weight:  [229 lb (103.9 kg)] 229 lb (103.9 kg) (12/26 1954) Last BM Date: 04/28/16 General:   Alert, obese white female. Uncomfortable in bed with abdominal pain.  Head:  Normocephalic and atraumatic. Eyes:  Sclera clear, no icterus.   Conjunctiva pink. Ears:  Normal auditory acuity. Nose:  No deformity, discharge,  or lesions. Neck:  Supple; no masses Lungs:  Clear throughout to auscultation.   No wheezes, crackles, or rhonchi.  Heart:  Regular rate and rhythm; no murmurs, clicks, rubs,  or gallops. Abdomen:  Soft, obese, lap port holes without drainage. Hypoactive bowel sounds.    Rectal:  Deferred  Msk:  Symmetrical without gross deformities. . Pulses:  Normal pulses noted. Extremities:  Without clubbing or edema. Neurologic:  Alert and  oriented x4;  grossly normal neurologically. Skin:  Intact without significant lesions or rashes.. Psych:  Alert and cooperative. Teary eyed..  Intake/Output from previous day: 12/26 0701 - 12/27 0700 In: 900 [I.V.:900] Out: -  Intake/Output this shift: Total I/O In: 1200  [I.V.:1200] Out: 200 [Urine:200]  Lab Results:  Recent Labs  04/28/16 1137  WBC 9.7  HGB 12.7  HCT 39.4  PLT 392   BMET  Recent Labs  04/28/16 1137  NA 140  K 4.4  CL 107  CO2 24  GLUCOSE 101*  BUN 26*  CREATININE 1.10*  CALCIUM 9.7   LFT  Recent Labs  04/28/16 1137  PROT 8.0  ALBUMIN 4.1  AST 22  ALT 16  ALKPHOS 84  BILITOT 0.3    Studies/Results: Dg Cholangiogram Operative  Result Date: 04/29/2016 CLINICAL DATA:  Intraoperative cholangiogram during laparoscopic cholecystectomy. EXAM: INTRAOPERATIVE CHOLANGIOGRAM FLUOROSCOPY TIME:  27 seconds COMPARISON:  Nuclear medicine HIDA scan with biliary ejection fraction - 04/06/2016; CT abdomen pelvis - 03/24/2016 FINDINGS: Intraoperative cholangiographic images of the right upper abdominal quadrant during laparoscopic cholecystectomy are provided for review. Surgical clips overlie the expected location of the gallbladder fossa. Contrast injection demonstrates selective cannulation of the central aspect of the cystic duct. There is passage of contrast through the central aspect of the cystic duct with filling of a non dilated common bile duct. There is passage of contrast though the CBD and into the descending portion of the duodenum. There is minimal reflux of injected contrast into the common hepatic duct and central aspect of the non dilated intrahepatic biliary system. There are apparent persistent nonocclusive filling defects within distal aspect of the CBD which may represent nonocclusive gallstones versus biliary sludge. IMPRESSION: Persistent nonocclusive filling defects within the distal aspect of the CBD may be artifactual secondary to underdistention though nonocclusive stones / sludge could have a similar appearance. Correlation with the operative report is recommended. Electronically Signed   By: Sandi Mariscal M.D.   On: 04/29/2016 12:44    IMPRESSION / PLAN:   27. 75 yo female who is s/p lap chole today for  chronic abdominal pain / biliary dyskinesia. IOC suggests presence of non-obstructing distal CBD stones or sludge. Her LFTs are normal.  -She had a pacemaker placed 6 months ago so probably not MRCP candidate. Nothing to do acutely. We can follow this outpatient.  Patient has just lost IV access. She is in pain at present. Husband in room, he is well informed about IOC findings and also intraoperative liver findings.   2. Abnormal liver parenchyma (area surrounding gallbladder). Biopsies taken and pending.  We can follow up on liver biopsy results as outpatient as well. I will send a note to patient's primary GI, Dr. Ardis Hughs.  Tye Savoy NP 04/29/2016, 2:34 PM  Pager number [phone]

## 2016-04-29 NOTE — Discharge Instructions (Signed)
LAPAROSCOPIC SURGERY: POST OP INSTRUCTIONS  °1. DIET: Follow a light bland diet the first 24 hours after arrival home, such as soup, liquids, crackers, etc. Be sure to include lots of fluids daily. Avoid fast food or heavy meals as your are more likely to get nauseated. Eat a low fat the next few days after surgery.  °2. Take your usually prescribed home medications unless otherwise directed. °3. PAIN CONTROL:  °1. Pain is best controlled by a usual combination of three different methods TOGETHER:  °1. Ice/Heat °2. Over the counter pain medication °3. Prescription pain medication °2. Most patients will experience some swelling and bruising around the incisions. Ice packs or heating pads (30-60 minutes up to 6 times a day) will help. Use ice for the first few days to help decrease swelling and bruising, then switch to heat to help relax tight/sore spots and speed recovery. Some people prefer to use ice alone, heat alone, alternating between ice & heat. Experiment to what works for you. Swelling and bruising can take several weeks to resolve.  °3. It is helpful to take an over-the-counter pain medication regularly for the first few weeks. Choose one of the following that works best for you:  °1. Naproxen (Aleve, etc) Two 220mg tabs twice a day °2. Ibuprofen (Advil, etc) Three 200mg tabs four times a day (every meal & bedtime) °3. Acetaminophen (Tylenol, etc) 500-650mg four times a day (every meal & bedtime) °4. A prescription for pain medication (such as oxycodone, hydrocodone, etc) should be given to you upon discharge. Take your pain medication as prescribed.  °1. If you are having problems/concerns with the prescription medicine (does not control pain, nausea, vomiting, rash, itching, etc), please call us [phone] to see if we need to switch you to a different pain medicine that will work better for you and/or control your side effect better. °2. If you need a refill on your pain medication, please contact  your pharmacy. They will contact our office to request authorization. Prescriptions will not be filled after 5 pm or on week-ends. °4. Avoid getting constipated. Between the surgery and the pain medications, it is common to experience some constipation. Increasing fluid intake and taking a fiber supplement (such as Metamucil, Citrucel, FiberCon, MiraLax, etc) 1-2 times a day regularly will usually help prevent this problem from occurring. A mild laxative (prune juice, Milk of Magnesia, MiraLax, etc) should be taken according to package directions if there are no bowel movements after 48 hours.  °5. Watch out for diarrhea. If you have many loose bowel movements, simplify your diet to bland foods & liquids for a few days. Stop any stool softeners and decrease your fiber supplement. Switching to mild anti-diarrheal medications (Kayopectate, Pepto Bismol) can help. If this worsens or does not improve, please call us. °6. Wash / shower every day. You may shower over the dressings as they are waterproof. Continue to shower over incision(s) after the dressing is off. °7. Remove your waterproof bandages 5 days after surgery. You may leave the incision open to air. You may replace a dressing/Band-Aid to cover the incision for comfort if you wish.  °8. ACTIVITIES as tolerated:  °1. You may resume regular (light) daily activities beginning the next day--such as daily self-care, walking, climbing stairs--gradually increasing activities as tolerated. If you can walk 30 minutes without difficulty, it is safe to try more intense activity such as jogging, treadmill, bicycling, low-impact aerobics, swimming, etc. °2. Save the most intensive and strenuous activity for   last such as sit-ups, heavy lifting, contact sports, etc Refrain from any heavy lifting or straining until you are off narcotics for pain control.  °3. DO NOT PUSH THROUGH PAIN. Let pain be your guide: If it hurts to do something, don't do it. Pain is your body warning  you to avoid that activity for another week until the pain goes down. °4. You may drive when you are no longer taking prescription pain medication, you can comfortably wear a seatbelt, and you can safely maneuver your car and apply brakes. °5. You may have sexual intercourse when it is comfortable.  °9. FOLLOW UP in our office  °1. Please call [facility] at [phone] to set up an appointment to see your surgeon in the office for a follow-up appointment approximately 2-3 weeks after your surgery. °2. Make sure that you call for this appointment the day you arrive home to insure a convenient appointment time. °     10. IF YOU HAVE DISABILITY OR FAMILY LEAVE FORMS, BRING THEM TO THE               OFFICE FOR PROCESSING.  ° °WHEN TO CALL US [phone]:  °1. Poor pain control °2. Reactions / problems with new medications (rash/itching, nausea, etc)  °3. Fever over 101.5 F (38.5 C) °4. Inability to urinate °5. Nausea and/or vomiting °6. Worsening swelling or bruising °7. Continued bleeding from incision. °8. Increased pain, redness, or drainage from the incision ° °The clinic staff is available to answer your questions during regular business hours (8:30am-5pm). Please don’t hesitate to call and ask to speak to one of our nurses for clinical concerns.  °If you have a medical emergency, go to the nearest emergency room or call 911.  °A surgeon from [facility] is always on call at the hospitals  ° °[facility]  °[address], [address], [place], [place] [address] ?  °MAIN: [phone] ? TOLL FREE: [phone] ?  °FAX [phone]  °[email or web address] ° ° °

## 2016-04-29 NOTE — Addendum Note (Signed)
Addendum  created 04/29/16 1452 by West Pugh, CRNA   Anesthesia Intra Meds edited

## 2016-04-29 NOTE — Anesthesia Preprocedure Evaluation (Signed)
Anesthesia Evaluation  Patient identified by MRN, date of birth, ID band Patient awake    Reviewed: Allergy & Precautions, NPO status , Patient's Chart, lab work & pertinent test results  History of Anesthesia Complications (+) PONV and history of anesthetic complications  Airway Mallampati: II  TM Distance: >3 FB Neck ROM: Full    Dental  (+) Edentulous Upper, Partial Lower, Dental Advisory Given, Missing   Pulmonary sleep apnea and Continuous Positive Airway Pressure Ventilation , COPD,  COPD inhaler, former smoker,    breath sounds clear to auscultation       Cardiovascular hypertension, + angina ("cleared" by cardiology) with exertion + CAD (mild, non-obstructive by cath) and + Peripheral Vascular Disease   Rhythm:Regular Rate:Normal  '13 stress: no ischemia '15 ECHO: normal LV function with mild LVH, EF 55-60%    Neuro/Psych  Headaches,    GI/Hepatic Neg liver ROS, GERD  Medicated and Controlled,  Endo/Other  diabetes, Insulin Dependent, Oral Hypoglycemic AgentsHypothyroidism   Renal/GU negative Renal ROS     Musculoskeletal  (+) Arthritis ,   Abdominal (+) + obese,   Peds  Hematology  (+) Blood dyscrasia (Hb 11.4), ,   Anesthesia Other Findings   Reproductive/Obstetrics                             Anesthesia Physical  Anesthesia Plan  ASA: III  Anesthesia Plan: General   Post-op Pain Management:    Induction: Intravenous  Airway Management Planned: Oral ETT  Additional Equipment:   Intra-op Plan:   Post-operative Plan: Extubation in OR  Informed Consent: I have reviewed the patients History and Physical, chart, labs and discussed the procedure including the risks, benefits and alternatives for the proposed anesthesia with the patient or authorized representative who has indicated his/her understanding and acceptance.   Dental advisory given  Plan Discussed with:  CRNA, Surgeon and Anesthesiologist  Anesthesia Plan Comments: (Plan routine monitors, GETA)        Anesthesia Quick Evaluation

## 2016-04-29 NOTE — Op Note (Addendum)
04/28/2016 - 04/29/2016  1:11 PM  PATIENT:  Kayla [name], 75 y.o., female, MRN: [id number]  PREOP DIAGNOSIS:  Biliary dyskinesia  POSTOP DIAGNOSIS:   Chronic cholecystitis, cholelithiasis, possible CBD stone, Changes of liver around the gall bladder (segment 5 and 6) [photo in chart]  PROCEDURE:   Procedure(s): LAPAROSCOPIC CHOLECYSTECTOMY WITH INTRAOPERATIVE CHOLANGIOGRAM, Biopsy of right lobe of liver  SURGEON:   Alphonsa Overall, M.D.  ASSISTANT:   Izora Gala, PA  ANESTHESIA:   general  Anesthesiologist: Duane Boston, MD; Montez Hageman, MD CRNA: Glory Buff, CRNA; West Pugh, CRNA  General  ASA: 3  EBL:  minimal  ml  BLOOD ADMINISTERED: none  DRAINS: none   LOCAL MEDICATIONS USED:   30 cc 1/4% marcaine  SPECIMEN:   Gall bladder and liver  COUNTS CORRECT:  YES  INDICATIONS FOR PROCEDURE:  Kayla [name] is a 75 y.o. (DOB: 1940-11-26) white female whose primary care physician is [facility], MD and comes for cholecystectomy.   The indications and risks of the gall bladder surgery were explained to the patient.  The risks include, but are not limited to, infection, bleeding, common bile duct injury and open surgery.  SURGERY:  The patient was taken to OR room #4 at [facility].  The abdomen was prepped with chloroprep.  The patient was given 2 gm Ancef at the beginning of the operation.   A time out was held and the surgical checklist run.   An infraumbilical incision was made into the abdominal cavity.  A 12 mm Hasson trocar was inserted into the abdominal cavity through the infraumbilical incision and secured with a 0 Vicryl suture.  Three additional trocars were inserted: a 10 mm trocar in the sub-xiphoid location, a 5 mm trocar in the right mid subcostal area, and a 5 mm trocar in the right lateral subcostal area.   The abdomen was explored and the stomach, and bowel that could be seen were unremarkable.  She had this change of the  liver around the gall bladder with paler tissue and rougher looking capsule.   The gall bladder was thickened with some adhesions about 1/2 the way up the gall bladder.  This is consistent with chronic cholecystitis.   I grasped the gall bladder and rotated it cephalad.  Disssection was carried down to the gall bladder/cystic duct junction and the cystic duct isolated.  A clip was placed on the gall bladder side of the cystic duct.   An intra-operative cholangiogram was shot.   The intra-operative cholangiogram was shot using a cut off Taut catheter placed through a 14 gauge angiocath in the RUQ.  The Taut catheter was inserted in the cut cystic duct and secured with an endoclip.  A cholangiogram was shot with 20 cc of 1/2 strength Isoview.  Using fluoroscopy, the cholangiogram showed the flow of contrast into the common bile duct, up the hepatic radicals, and into the duodenum.  I shot two films.  There is on the cholangiogram some apparent filling defects in the distal common bile which are non obstructing.   The Taut catheter was removed.  The cystic duct was tripley endoclipped and the cystic artery was identified and clipped.  The gall bladder was bluntly and sharpley dissected from the gall bladder bed.   After the gall bladder was removed from the liver, the gall bladder bed and [place] were inspected.  There was no bleeding or bile leak.  The gall bladder was placed in  a endocatch bag and delivered through the umbilicus.  The abdomen was irrigated with 800 cc saline.   I did a biopsy of the abnormal area of liver around the gall bladder that I identified at the beginning of the operation.  This would be the middle of the right lobe of the liver at the gall bladder edge.  I scored a 1.5 cm area of the liver capsule.  I then used scissors and cautery to biopsy a 1.5 cm segment of liver.  This was sent separately from the gall bladder.   The trocars were then removed.  I infiltrated 30cc  of 1/4% Marcaine into the incisions.  The umbilical port closed with a 0 Vicryl suture and the skin closed with 4-0 Monocryl.  The skin was painted with DermaBond.  The patient's sponge and needle count were correct.  The patient was transported to the RR in good condition.   Will discuss cholangiogram findings with Dr. Ardis Hughs or partner.  Alphonsa Overall, MD, [facility] Pager: [phone] Office phone:  [phone]

## 2016-04-30 ENCOUNTER — Encounter (HOSPITAL_COMMUNITY)
Admission: RE | Admit: 2016-04-30 | Discharge: 2016-04-30 | Disposition: A | Discharge status: Home / Self Care | Payer: Commercial Managed Care - HMO | Source: Ambulatory Visit | Attending: General Surgery | Admitting: General Surgery

## 2016-04-30 DIAGNOSIS — K819 Cholecystitis, unspecified: Secondary | ICD-10-CM | POA: Diagnosis present

## 2016-04-30 LAB — COMPREHENSIVE METABOLIC PANEL
ALT: 27 U/L (ref 14–54)
AST: 52 U/L — ABNORMAL HIGH (ref 15–41)
Albumin: 3.5 g/dL (ref 3.5–5.0)
Alkaline Phosphatase: 69 U/L (ref 38–126)
Anion gap: 9 (ref 5–15)
BUN: 16 mg/dL (ref 6–20)
CO2: 24 mmol/L (ref 22–32)
Calcium: 9.3 mg/dL (ref 8.9–10.3)
Chloride: 104 mmol/L (ref 101–111)
Creatinine, Ser: 1.04 mg/dL — ABNORMAL HIGH (ref 0.44–1.00)
GFR calc Af Amer: 59 mL/min — ABNORMAL LOW (ref >60)
GFR calc non Af Amer: 51 mL/min — ABNORMAL LOW (ref >60)
Glucose, Bld: 162 mg/dL — ABNORMAL HIGH (ref 65–99)
Potassium: 5.2 mmol/L — ABNORMAL HIGH (ref 3.5–5.1)
Sodium: 137 mmol/L (ref 135–145)
Total Bilirubin: 0.5 mg/dL (ref 0.3–1.2)
Total Protein: 7.2 g/dL (ref 6.5–8.1)

## 2016-04-30 LAB — GLUCOSE, CAPILLARY
Glucose-Capillary: 157 mg/dL — ABNORMAL HIGH (ref 65–99)
Glucose-Capillary: 144 mg/dL — ABNORMAL HIGH (ref 65–99)

## 2016-04-30 MED ORDER — ONDANSETRON 4 MG PO TBDP: 4.0000 mg | ORAL_TABLET | Freq: Four times a day (QID) | ORAL | 0 refills | Status: DC | PRN

## 2016-04-30 MED ORDER — HYDROCODONE-ACETAMINOPHEN 5-325 MG PO TABS: 1.0000 | ORAL_TABLET | Freq: Four times a day (QID) | ORAL | 0 refills | Status: DC | PRN

## 2016-04-30 NOTE — Care Management CC44 (Signed)
Condition Code 44 Documentation Completed  Patient Details  Name: Kayla [name] MRN: [id number] Date of Birth: 06/12/1940   Condition Code 44 given:  Yes (pt already discharged) Patient signature on Condition Code 44 notice:   (telephone and witnessed by RN) Documentation of 2 MD's agreement:  Yes Code 44 added to claim:  Yes    Dellie Catholic, RN 04/30/2016, 9:51 AM

## 2016-04-30 NOTE — Progress Notes (Signed)
Discharge instructions discussed with patient and family, verbalized agreement and understanding, prescription given to patient 

## 2016-04-30 NOTE — Progress Notes (Signed)
Patient ID: Kayla [name], female   DOB: [birth year], 75 y.o.   MRN: [id number]  [facility] Progress Note  1 Day Post-Op  Subjective: Feeling great this morning. Pain is well controlled. Tolerating regular diet. Passing flatus. Ambulated multiple times last night. Hoping to go home this morning.  Objective: Vital signs in last 24 hours: Temp:  [97.4 F (36.3 C)-100.4 F (38 C)] 98.3 F (36.8 C) (12/28 0655) Pulse Rate:  [62-98] 62 (12/28 0655) Resp:  [12-33] 18 (12/28 0655) BP: (136-197)/(47-110) 146/52 (12/28 0655) SpO2:  [95 %-100 %] 95 % (12/28 0655) Last BM Date: 04/28/16  Intake/Output from previous day: 12/27 0701 - 12/28 0700 In: 3960 [P.O.:360; I.V.:3600] Out: 1950 [Urine:1950] Intake/Output this shift: No intake/output data recorded.  PE: Gen:  Alert, NAD, pleasant Card:  RRR, no M/G/R heard Pulm:  CTAB, no W/R/R, effort normal Abd: Soft, NT/ND, +BS, multiple lap incisions C/D/I  Lab Results:   Recent Labs  04/28/16 1137  WBC 9.7  HGB 12.7  HCT 39.4  PLT 392   BMET  Recent Labs  04/28/16 1137 04/30/16 0428  NA 140 137  K 4.4 5.2*  CL 107 104  CO2 24 24  GLUCOSE 101* 162*  BUN 26* 16  CREATININE 1.10* 1.04*  CALCIUM 9.7 9.3   PT/INR No results for input(s): LABPROT, INR in the last 72 hours. CMP     Component Value Date/Time   NA 137 04/30/2016 0428   K 5.2 (H) 04/30/2016 0428   CL 104 04/30/2016 0428   CO2 24 04/30/2016 0428   GLUCOSE 162 (H) 04/30/2016 0428   BUN 16 04/30/2016 0428   CREATININE 1.04 (H) 04/30/2016 0428   CREATININE 1.32 (H) 11/06/2015 1006   CALCIUM 9.3 04/30/2016 0428   CALCIUM 9.9 06/04/2008 2240   PROT 7.2 04/30/2016 0428   ALBUMIN 3.5 04/30/2016 0428   AST 52 (H) 04/30/2016 0428   ALT 27 04/30/2016 0428   ALKPHOS 69 04/30/2016 0428   BILITOT 0.5 04/30/2016 0428   GFRNONAA 51 (L) 04/30/2016 0428   GFRAA 59 (L) 04/30/2016 0428   Lipase     Component Value Date/Time   LIPASE 24 04/28/2016  1137       Studies/Results: Dg Cholangiogram Operative  Result Date: 04/29/2016 CLINICAL DATA:  Intraoperative cholangiogram during laparoscopic cholecystectomy. EXAM: INTRAOPERATIVE CHOLANGIOGRAM FLUOROSCOPY TIME:  27 seconds COMPARISON:  Nuclear medicine HIDA scan with biliary ejection fraction - 04/06/2016; CT abdomen pelvis - 03/24/2016 FINDINGS: Intraoperative cholangiographic images of the right upper abdominal quadrant during laparoscopic cholecystectomy are provided for review. Surgical clips overlie the expected location of the gallbladder fossa. Contrast injection demonstrates selective cannulation of the central aspect of the cystic duct. There is passage of contrast through the central aspect of the cystic duct with filling of a non dilated common bile duct. There is passage of contrast though the CBD and into the descending portion of the duodenum. There is minimal reflux of injected contrast into the common hepatic duct and central aspect of the non dilated intrahepatic biliary system. There are apparent persistent nonocclusive filling defects within distal aspect of the CBD which may represent nonocclusive gallstones versus biliary sludge. IMPRESSION: Persistent nonocclusive filling defects within the distal aspect of the CBD may be artifactual secondary to underdistention though nonocclusive stones / sludge could have a similar appearance. Correlation with the operative report is recommended. Electronically Signed   By: Sandi Mariscal M.D.   On: 04/29/2016 12:44    Anti-infectives: Anti-infectives  Start     Dose/Rate Route Frequency Ordered Stop   04/29/16 1145  ceFAZolin (ANCEF) IVPB 2 g/50 mL premix     2 g 100 mL/hr over 30 Minutes Intravenous  Once 04/29/16 1135 04/29/16 1153       Assessment/Plan S/p LAPAROSCOPIC CHOLECYSTECTOMY WITH INTRAOPERATIVE CHOLANGIOGRAM, Biopsy of right lobe of liver 12/27 Dr. Lucia Gaskins - POD 1 - biopsy pending - per GI patient does not need  ERCP - LFT's WNL  CAD S/p pacemaker 11/08/2015 COPD DM Obesity  FEN - carb modified VTE - heparin, SCDs  Plan - Pain well controlled, tolerating diet, passing flatus, ready for discharge. Patient will follow-up with Dr. Lucia Gaskins in about 3 weeks.   LOS: 2 days    Jerrye Beavers , [facility] 04/30/2016, 8:13 AM Pager: [phone] Consults: [phone]  Agree with above. Early she seems to be doing well. To go home.  Alphonsa Overall, MD, [facility] Pager: [phone] Office phone:  [phone]

## 2016-04-30 NOTE — Care Management Obs Status (Signed)
[facility] NOTIFICATION   Patient Details  Name: Kayla [name] MRN: [id number] Date of Birth: 01-08-1941   Medicare Observation Status Notification Given:  Yes    Dellie Catholic, RN 04/30/2016, 9:51 AM

## 2016-04-30 NOTE — Discharge Summary (Signed)
[facility] Discharge Summary   Patient ID: Kayla [name] MRN: [id number] DOB/AGE: 1941/04/29 75 y.o.  Admit date: 04/28/2016 Discharge date: 04/30/2016  Admitting Diagnosis: Acute cholecystitis  Discharge Diagnosis Patient Active Problem List   Diagnosis Date Noted  . Cholecystitis 04/30/2016  . Gall bladder disease 04/28/2016  . Sinus node dysfunction ([place]) 11/08/2015  . Ejection fraction   . Spinal stenosis of cervical region 11/07/2014  . Preoperative clearance 10/09/2014  . Carotid artery disease ([place])   . S/P left TKA 07/27/2011  . Dizzy spells 05/04/2011  . Palpitations 01/21/2011  . RHINITIS 07/21/2010  . Hyperlipidemia 11/24/2008  . OBESITY 11/24/2008  . Coronary atherosclerosis 11/24/2008  . VENTRICULAR HYPERTROPHY, LEFT 11/24/2008  . HYPOTHYROIDISM, POSTSURGICAL 06/04/2008  . HEADACHE 06/04/2008  . DM (diabetes mellitus) ([place]) 03/23/2007  . G E R D 03/23/2007  . DIVERTICULOSIS OF COLON 03/23/2007  . GENERALIZED OSTEOARTHROSIS UNSPECIFIED SITE 03/23/2007  . OBSTRUCTIVE SLEEP APNEA 02/16/2007  . Essential hypertension 02/16/2007  . COPD 02/16/2007    Consultants None  Imaging: Dg Cholangiogram Operative  Result Date: 04/29/2016 CLINICAL DATA:  Intraoperative cholangiogram during laparoscopic cholecystectomy. EXAM: INTRAOPERATIVE CHOLANGIOGRAM FLUOROSCOPY TIME:  27 seconds COMPARISON:  Nuclear medicine HIDA scan with biliary ejection fraction - 04/06/2016; CT abdomen pelvis - 03/24/2016 FINDINGS: Intraoperative cholangiographic images of the right upper abdominal quadrant during laparoscopic cholecystectomy are provided for review. Surgical clips overlie the expected location of the gallbladder fossa. Contrast injection demonstrates selective cannulation of the central aspect of the cystic duct. There is passage of contrast through the central aspect of the cystic duct with filling of a non dilated common bile duct. There is passage of contrast  though the CBD and into the descending portion of the duodenum. There is minimal reflux of injected contrast into the common hepatic duct and central aspect of the non dilated intrahepatic biliary system. There are apparent persistent nonocclusive filling defects within distal aspect of the CBD which may represent nonocclusive gallstones versus biliary sludge. IMPRESSION: Persistent nonocclusive filling defects within the distal aspect of the CBD may be artifactual secondary to underdistention though nonocclusive stones / sludge could have a similar appearance. Correlation with the operative report is recommended. Electronically Signed   By: Kayla [name] M.D.   On: 04/29/2016 12:44    Procedures Dr. Lucia Gaskins (04/29/16) - Laparoscopic Cholecystectomy with IOC, Biopsy of right lobe of liver  Hospital Course:  Kayla [name] is a 75yo female who presented to [facility] 04/28/16 with intractable RUQ abdominal pain, nausea, and vomiting. She was scheduled for a cholecystectomy with Dr. Redmond Pulling 05/08/16, but due to her symptoms she was unable to wait until then and therefore came to the ED.  Workup showed LFT's WNL.  Patient was admitted and underwent procedure listed above.  Tolerated procedure well and was transferred to the floor.  GI was consulted to evaluate possible choledocholithiasis; ultimately they determined she did not have any stones in the common bile duct and therefore no need for ERCP. LFTs were WNL on POD1. On POD1 the patient was voiding well, tolerating diet, ambulating well, pain well controlled, vital signs stable, incisions c/d/i and felt stable for discharge home.  Patient will follow up in our office in about 3 weeks and knows to call with questions or concerns.  Liver biopsy results pending.  Physical Exam: Gen:  Alert, NAD, pleasant Card:  RRR, no M/G/R heard Pulm:  CTAB, no W/R/R, effort normal Abd: Soft, NT/ND, +BS, multiple lap incisions C/D/I  Allergies as  of 04/30/2016   No Known  Allergies     Medication List    STOP taking these medications   traMADol 50 MG tablet Commonly known as:  ULTRAM     TAKE these medications   acetaminophen 500 MG tablet Commonly known as:  TYLENOL Take 1,000 mg by mouth every 8 (eight) hours as needed for mild pain or moderate pain.   albuterol (2.5 MG/3ML) 0.083% nebulizer solution Commonly known as:  PROVENTIL Take 2.5 mg by nebulization every 6 (six) hours as needed for wheezing or shortness of breath.   albuterol 108 (90 Base) MCG/ACT inhaler Commonly known as:  PROAIR HFA Inhale 2 puffs into the lungs every 6 (six) hours as needed for wheezing or shortness of breath.   amLODipine 5 MG tablet Commonly known as:  NORVASC Take 5 mg by mouth daily.   aspirin 81 MG tablet Take 81 mg by mouth daily.   B-12 PO Take 1 tablet by mouth daily.   budesonide-formoterol 160-4.5 MCG/ACT inhaler Commonly known as:  SYMBICORT Inhale 2 puffs into the lungs 2 (two) times daily.   D3-1000 PO Take 1 tablet by mouth daily.   fluticasone 50 MCG/ACT nasal spray Commonly known as:  FLONASE USE 2 SPRAYS NASALLY DAILY What changed:  how much to take  how to take this  when to take this  additional instructions   furosemide 20 MG tablet Commonly known as:  LASIX Take 1 tablet (20 mg total) by mouth daily.   gabapentin 600 MG tablet Commonly known as:  NEURONTIN Take 600 mg by mouth 3 (three) times daily.   HYDROcodone-acetaminophen 5-325 MG tablet Commonly known as:  NORCO/VICODIN Take 1-2 tablets by mouth every 6 (six) hours as needed for moderate pain. What changed:  when to take this   insulin glargine 100 UNIT/ML injection Commonly known as:  LANTUS Inject 100 Units into the skin 2 (two) times daily.   levothyroxine 200 MCG tablet Commonly known as:  SYNTHROID, LEVOTHROID Take 200 mcg by mouth daily.   LORazepam 2 MG tablet Commonly known as:  ATIVAN Take 2 mg by mouth daily as needed for anxiety. FOR  DEPRESSION   metFORMIN 500 MG tablet Commonly known as:  GLUCOPHAGE Take 1,000 mg by mouth 2 (two) times daily with a meal.   nitroGLYCERIN 0.4 MG SL tablet Commonly known as:  NITROSTAT Place 1 tablet (0.4 mg total) under the tongue every 5 (five) minutes as needed for chest pain.   omeprazole 20 MG capsule Commonly known as:  PRILOSEC Take 20 mg by mouth daily.   ondansetron 4 MG disintegrating tablet Commonly known as:  ZOFRAN-ODT Take 1 tablet (4 mg total) by mouth every 6 (six) hours as needed for nausea.   PHENADOZ 25 MG suppository Generic drug:  promethazine Place 2.5 mg rectally daily as needed (headaches).   simvastatin 20 MG tablet Commonly known as:  ZOCOR Take 20 mg by mouth daily.   valsartan-hydrochlorothiazide 320-25 MG tablet Commonly known as:  DIOVAN-HCT Take 1 tablet by mouth daily.        Follow-up Information    Kristen Bushway H, MD. Go on 05/22/2016.   Specialty:  General Surgery Why:  Your appointment is 05/21/16 at 11am. Please arrive 30 minutes prior to your appointment to get checked in and to fill out necessary paperwork. Contact information: [place] [address] [phone]           Signed: Jerrye Beavers, PA-C [facility] 04/30/2016, 1:37 PM Pager: [phone] Consults: [phone]  Agree with above. Symptoms improved. Will follow up with me.  Will need to get results of the pathology.  Alphonsa Overall, MD, [facility] Pager: [phone] Office phone:  [phone]

## 2016-05-01 NOTE — Telephone Encounter (Signed)
Spoke w/ pt and she informed me that she got so sick over the Christmas holiday and she had her surgery on 04-28-16. I informed her that I had spoke w/ Santiago Glad at [facility] and Santiago Glad was suppose to call me back but I never heard back. Pt verbalized understanding.

## 2016-05-07 DIAGNOSIS — R1031 Right lower quadrant pain: Secondary | ICD-10-CM | POA: Diagnosis not present

## 2016-05-08 ENCOUNTER — Encounter (HOSPITAL_COMMUNITY): Admission: RE | Payer: Self-pay | Source: Ambulatory Visit

## 2016-05-08 ENCOUNTER — Ambulatory Visit (HOSPITAL_COMMUNITY)
Admission: RE | Admit: 2016-05-08 | Payer: Commercial Managed Care - HMO | Source: Ambulatory Visit | Admitting: General Surgery

## 2016-05-08 SURGERY — LAPAROSCOPIC CHOLECYSTECTOMY
Anesthesia: General

## 2016-05-11 ENCOUNTER — Ambulatory Visit (INDEPENDENT_AMBULATORY_CARE_PROVIDER_SITE_OTHER): Payer: Commercial Managed Care - HMO | Admitting: Gastroenterology

## 2016-05-11 ENCOUNTER — Encounter (HOSPITAL_COMMUNITY): Payer: Self-pay | Admitting: *Deleted

## 2016-05-11 ENCOUNTER — Encounter: Payer: Self-pay | Admitting: Gastroenterology

## 2016-05-11 VITALS — BP 150/58 | HR 72 | Ht 65.0 in | Wt 222.0 lb

## 2016-05-11 DIAGNOSIS — R1011 Right upper quadrant pain: Secondary | ICD-10-CM

## 2016-05-11 NOTE — Patient Instructions (Addendum)
We will ask for lab results from [facility] appt last week.  You have been scheduled for upper endoscopic ultrasound at [facility].  Your body mass index should be between 23-30. Your Body mass index is 36.94 kg/m. If this is out of the aforementioned range listed, please consider follow up with your Primary Care Provider.

## 2016-05-11 NOTE — Progress Notes (Signed)
Review of pertinent gastrointestinal problems: 1. Chronic cholecystitis: 2017 ultrasound was normal. She was having epigastric pain, tenderness. CT scan 2017 unrevealing. Eventual HIDA scan with ejection fraction of the gallbladder showed ejection fraction of 18% suggesting possible chronic cholecystitis with biliary dyskinesia. Shee met with a surgeon and was going to have elective laparoscopic cholecystectomy but presented with worsening abdominal pain in her gallbladder was removed while in the hospital. December 2017 laparoscopic cholecystectomy Dr. Alphonsa Overall was uneventful, Intra-Op cholangiogram suggested possible filling defects in the common bile duct. She was seen by one of my partners Dr. Rachael Darby his review of the [place] he felt that there were probably not filling defects, the liver tests were normal.   HPI: This is a   very pleasant 76 year old woman who is here with her husband today  Chief complaint is persistent right upper quadrant abdominal pain  She had her gallbladder removed about 2 weeks ago and her nausea is gone but she is still suffering with right upper quadrant abdominal pain. She says this is no better than prior to when her gallbladder was removed.  The nausea is gone.  She still has significant RUQ pains. Does not seem related to eating. The pain is fairly constant. It started 7-8 weeks ago.  ROS: complete GI ROS as described in HPI.  Constitutional:  No unintentional weight loss   Past Medical History:  Diagnosis Date  . Arthritis   . CAD (coronary artery disease)    Mild per remote cath in 2006, /    Nuclear, January, 2013, no significant abnormality,  . Carotid artery disease ([place])    Doppler, January, 2013, 0 17% RIC A., 00-17% LICA, stable  . Complication of anesthesia    wakes up "mean"  . COPD (chronic obstructive pulmonary disease) ([place])   . Diabetes mellitus, type 2 ([place])   . Diverticula of colon   . Dizziness    occasional  . Ejection fraction    . GERD (gastroesophageal reflux disease)   . Goiter    hx of  . Headache(784.0)    migraine  . Hyperlipidemia   . Hypothyroidism   . Left ventricular hypertrophy   . Leg edema    occasional swelling  . Obesity   . OSA (obstructive sleep apnea)    cpap setting of 12  . PONV (postoperative nausea and vomiting)    severe after every surgery  . Pulmonary hypertension, secondary   . Sleep apnea     Past Surgical History:  Procedure Laterality Date  . ABDOMINAL HYSTERECTOMY     with appendectomy and colon polypectomy  . ANTERIOR CERVICAL DECOMP/DISCECTOMY FUSION N/A 11/07/2014   Procedure: Anterior Cervical diskectomy and fusion Cervical four-five, Cervical five-six, Cervical six-seven;  Surgeon: Kary Kos, MD;  Location: [facility];  Service: Neurosurgery;  Laterality: N/A;  . CHOLECYSTECTOMY N/A 04/29/2016   Procedure: LAPAROSCOPIC CHOLECYSTECTOMY WITH INTRAOPERATIVE CHOLANGIOGRAM;  Surgeon: Alphonsa Overall, MD;  Location: [facility];  Service: General;  Laterality: N/A;  . EP IMPLANTABLE DEVICE N/A 11/08/2015   Procedure: Pacemaker Implant;  Surgeon: Deboraha Sprang, MD;  Location: [facility];  Service: Cardiovascular;  Laterality: N/A;  . LUMBAR DISC SURGERY  1990's   x 2  . THYROIDECTOMY  2011  . TOTAL KNEE ARTHROPLASTY  07/27/2011   Procedure: TOTAL KNEE ARTHROPLASTY;  Surgeon: Mauri Pole, MD;  Location: [facility];  Service: Orthopedics;  Laterality: Left;    Current Outpatient Prescriptions  Medication Sig Dispense Refill  .  acetaminophen (TYLENOL) 500 MG tablet Take 1,000 mg by mouth every 8 (eight) hours as needed for mild pain or moderate pain.    Marland Kitchen albuterol (PROAIR HFA) 108 (90 Base) MCG/ACT inhaler Inhale 2 puffs into the lungs every 6 (six) hours as needed for wheezing or shortness of breath. 3 Inhaler 3  . albuterol (PROVENTIL) (2.5 MG/3ML) 0.083% nebulizer solution Take 2.5 mg by nebulization every 6 (six) hours as needed for wheezing or shortness of breath.    Marland Kitchen  amLODipine (NORVASC) 5 MG tablet Take 5 mg by mouth daily.     Marland Kitchen aspirin 81 MG tablet Take 81 mg by mouth daily.    . budesonide-formoterol (SYMBICORT) 160-4.5 MCG/ACT inhaler Inhale 2 puffs into the lungs 2 (two) times daily. 3 Inhaler 3  . Cholecalciferol (D3-1000 PO) Take 1 tablet by mouth daily.    . Cyanocobalamin (B-12 PO) Take 1 tablet by mouth daily.    . fluticasone (FLONASE) 50 MCG/ACT nasal spray USE 2 SPRAYS NASALLY DAILY (Patient taking differently: Place 2 sprays into both nostrils 2 (two) times daily. ) 48 g 3  . furosemide (LASIX) 20 MG tablet Take 1 tablet (20 mg total) by mouth daily. 90 tablet 3  . gabapentin (NEURONTIN) 600 MG tablet Take 600 mg by mouth 3 (three) times daily.     Marland Kitchen HYDROcodone-acetaminophen (NORCO/VICODIN) 5-325 MG tablet Take 1-2 tablets by mouth every 6 (six) hours as needed for moderate pain. 30 tablet 0  . insulin glargine (LANTUS) 100 UNIT/ML injection Inject 100 Units into the skin 2 (two) times daily.     Marland Kitchen levothyroxine (SYNTHROID, LEVOTHROID) 200 MCG tablet Take 200 mcg by mouth daily.     Marland Kitchen LORazepam (ATIVAN) 2 MG tablet Take 2 mg by mouth daily as needed for anxiety. FOR DEPRESSION    . metFORMIN (GLUCOPHAGE) 500 MG tablet Take 1,000 mg by mouth 2 (two) times daily with a meal.    . nitroGLYCERIN (NITROSTAT) 0.4 MG SL tablet Place 1 tablet (0.4 mg total) under the tongue every 5 (five) minutes as needed for chest pain. 25 tablet 3  . omeprazole (PRILOSEC) 20 MG capsule Take 20 mg by mouth daily.    . ondansetron (ZOFRAN-ODT) 4 MG disintegrating tablet Take 1 tablet (4 mg total) by mouth every 6 (six) hours as needed for nausea. 20 tablet 0  . PHENADOZ 25 MG suppository Place 2.5 mg rectally daily as needed (headaches).     . simvastatin (ZOCOR) 20 MG tablet Take 20 mg by mouth daily.     . valsartan-hydrochlorothiazide (DIOVAN-HCT) 320-25 MG tablet Take 1 tablet by mouth daily.     No current facility-administered medications for this visit.      Allergies as of 05/11/2016  . (No Known Allergies)    Family History  Problem Relation Age of Onset  . Heart Problems Mother   . Heart defect Mother     valve problems  . Heart Problems Father   . Heart disease Brother   . Lung cancer Daughter     non small cell   . Diabetes Maternal Aunt   . Diabetes Maternal Uncle     Social History   Social History  . Marital status: Married    Spouse name: Jeneen Rinks  . Number of children: 3  . Years of education: 12   Occupational History  . retired    Social History Main Topics  . Smoking status: Former Smoker    Packs/day: 1.50    Years:  34.00    Types: Cigarettes    Quit date: 05/04/1997  . Smokeless tobacco: Never Used  . Alcohol use No  . Drug use: No  . Sexual activity: Not on file   Other Topics Concern  . Not on file   Social History Narrative   Patient lives at home with her husband Jeneen Rinks .   Retired.   Education 12 th grade.   Right handed   Caffeine two cups of coffee.     Physical Exam: BP (!) 150/58   Pulse 72   Ht '5\' 5"'  (1.651 m)   Wt 222 lb (100.7 kg)   BMI 36.94 kg/m  Constitutional: generally well-appearing Psychiatric: alert and oriented x3 Abdomen: soft, nontender, nondistended, no obvious ascites, no peritoneal signs, normal bowel sounds No peripheral edema noted in lower extremities  Assessment and plan: 76 y.o. female with Persistent right upper quadrant pain, abnormal intra-cholangiogram 2 weeks ago  She has had ultrasound, CAT scan, HIDA scan, multiple lab tests. She had her gallbladder removed 2 weeks ago. None of these tests, procedures have helped her abdominal pains or explain them sufficiently. I was hoping she feel better since she had adhesions and chronic cholecystitis noted on pathology. Given question of retained gallstones on IOC I think she deserves further biliary workup. Unfortunately she has a pacemaker and so this cannot be done by MRI must instead be done invasively with an  endoscopic ultrasound. That we'll also give me time to examine the inside of her upper GI tract with endoscopy. We will try to get this done for her this week. I will also get some results from last week [place] lab testing here for review.    Owens Loffler, MD [facility] 05/11/2016, 8:43 AM

## 2016-05-11 NOTE — Addendum Note (Signed)
Addended by: Larina Bras on: 05/11/2016 09:51 AM   Modules accepted: Orders

## 2016-05-12 ENCOUNTER — Ambulatory Visit: Payer: Self-pay | Admitting: Family Medicine

## 2016-05-13 ENCOUNTER — Telehealth: Payer: Self-pay | Admitting: Family Medicine

## 2016-05-14 ENCOUNTER — Ambulatory Visit (HOSPITAL_COMMUNITY)
Admission: RE | Admit: 2016-05-14 | Discharge: 2016-05-14 | Disposition: A | Discharge status: Home / Self Care | Payer: Medicare HMO | Source: Ambulatory Visit | Attending: Gastroenterology | Admitting: Gastroenterology

## 2016-05-14 ENCOUNTER — Ambulatory Visit (HOSPITAL_COMMUNITY): Payer: Medicare HMO | Admitting: Registered Nurse

## 2016-05-14 ENCOUNTER — Encounter (HOSPITAL_COMMUNITY): Payer: Self-pay | Admitting: Registered Nurse

## 2016-05-14 ENCOUNTER — Encounter (HOSPITAL_COMMUNITY)
Admission: RE | Disposition: A | Discharge status: Home / Self Care | Payer: Self-pay | Source: Ambulatory Visit | Attending: Gastroenterology

## 2016-05-14 DIAGNOSIS — Z79899 Other long term (current) drug therapy: Secondary | ICD-10-CM | POA: Insufficient documentation

## 2016-05-14 DIAGNOSIS — K2950 Unspecified chronic gastritis without bleeding: Secondary | ICD-10-CM | POA: Diagnosis not present

## 2016-05-14 DIAGNOSIS — E119 Type 2 diabetes mellitus without complications: Secondary | ICD-10-CM | POA: Diagnosis not present

## 2016-05-14 DIAGNOSIS — B9681 Helicobacter pylori [H. pylori] as the cause of diseases classified elsewhere: Secondary | ICD-10-CM | POA: Diagnosis not present

## 2016-05-14 DIAGNOSIS — K2990 Gastroduodenitis, unspecified, without bleeding: Secondary | ICD-10-CM

## 2016-05-14 DIAGNOSIS — R69 Illness, unspecified: Secondary | ICD-10-CM | POA: Diagnosis not present

## 2016-05-14 DIAGNOSIS — E785 Hyperlipidemia, unspecified: Secondary | ICD-10-CM | POA: Diagnosis not present

## 2016-05-14 DIAGNOSIS — J449 Chronic obstructive pulmonary disease, unspecified: Secondary | ICD-10-CM | POA: Insufficient documentation

## 2016-05-14 DIAGNOSIS — Z7982 Long term (current) use of aspirin: Secondary | ICD-10-CM | POA: Insufficient documentation

## 2016-05-14 DIAGNOSIS — E039 Hypothyroidism, unspecified: Secondary | ICD-10-CM | POA: Insufficient documentation

## 2016-05-14 DIAGNOSIS — G4733 Obstructive sleep apnea (adult) (pediatric): Secondary | ICD-10-CM | POA: Diagnosis not present

## 2016-05-14 DIAGNOSIS — K5730 Diverticulosis of large intestine without perforation or abscess without bleeding: Secondary | ICD-10-CM | POA: Diagnosis not present

## 2016-05-14 DIAGNOSIS — K839 Disease of biliary tract, unspecified: Secondary | ICD-10-CM | POA: Diagnosis not present

## 2016-05-14 DIAGNOSIS — I272 Pulmonary hypertension, unspecified: Secondary | ICD-10-CM | POA: Insufficient documentation

## 2016-05-14 DIAGNOSIS — Z794 Long term (current) use of insulin: Secondary | ICD-10-CM | POA: Insufficient documentation

## 2016-05-14 DIAGNOSIS — K219 Gastro-esophageal reflux disease without esophagitis: Secondary | ICD-10-CM | POA: Insufficient documentation

## 2016-05-14 DIAGNOSIS — Z87891 Personal history of nicotine dependence: Secondary | ICD-10-CM | POA: Insufficient documentation

## 2016-05-14 DIAGNOSIS — I10 Essential (primary) hypertension: Secondary | ICD-10-CM | POA: Diagnosis not present

## 2016-05-14 DIAGNOSIS — R1011 Right upper quadrant pain: Secondary | ICD-10-CM | POA: Diagnosis not present

## 2016-05-14 DIAGNOSIS — K2970 Gastritis, unspecified, without bleeding: Secondary | ICD-10-CM | POA: Diagnosis not present

## 2016-05-14 DIAGNOSIS — I2510 Atherosclerotic heart disease of native coronary artery without angina pectoris: Secondary | ICD-10-CM | POA: Insufficient documentation

## 2016-05-14 HISTORY — PX: UPPER ESOPHAGEAL ENDOSCOPIC ULTRASOUND (EUS): SHX6562

## 2016-05-14 LAB — GLUCOSE, CAPILLARY: Glucose-Capillary: 136 mg/dL — ABNORMAL HIGH (ref 65–99)

## 2016-05-14 SURGERY — UPPER ESOPHAGEAL ENDOSCOPIC ULTRASOUND (EUS)
Anesthesia: Monitor Anesthesia Care

## 2016-05-14 MED ORDER — ONDANSETRON HCL 4 MG/2ML IJ SOLN
INTRAMUSCULAR | Status: DC | PRN
  Administered 2016-05-14: 4 mg via INTRAVENOUS

## 2016-05-14 MED ORDER — LIDOCAINE 2% (20 MG/ML) 5 ML SYRINGE
INTRAMUSCULAR | Status: DC | PRN
  Administered 2016-05-14: 100 mg via INTRAVENOUS

## 2016-05-14 MED ORDER — LACTATED RINGERS IV SOLN
INTRAVENOUS | Status: DC
  Administered 2016-05-14: 1000 mL via INTRAVENOUS

## 2016-05-14 MED ORDER — PROPOFOL 500 MG/50ML IV EMUL
INTRAVENOUS | Status: DC | PRN
  Administered 2016-05-14: 125 ug/kg/min via INTRAVENOUS

## 2016-05-14 MED ORDER — PROPOFOL 10 MG/ML IV BOLUS
INTRAVENOUS | Status: DC | PRN
  Administered 2016-05-14: 20 mg via INTRAVENOUS

## 2016-05-14 MED ORDER — ONDANSETRON HCL 4 MG/2ML IJ SOLN
INTRAMUSCULAR | Status: AC
  Filled 2016-05-14: qty 2

## 2016-05-14 MED ORDER — LIDOCAINE 2% (20 MG/ML) 5 ML SYRINGE
INTRAMUSCULAR | Status: AC
  Filled 2016-05-14: qty 5

## 2016-05-14 MED ORDER — SODIUM CHLORIDE 0.9 % IV SOLN: INTRAVENOUS | Status: DC

## 2016-05-14 MED ORDER — PROPOFOL 10 MG/ML IV BOLUS
INTRAVENOUS | Status: AC
  Filled 2016-05-14: qty 20

## 2016-05-14 NOTE — Anesthesia Postprocedure Evaluation (Signed)
Anesthesia Post Note  Patient: Kayla [name]  Procedure(s) Performed: Procedure(s) (LRB): UPPER ESOPHAGEAL ENDOSCOPIC ULTRASOUND (EUS) (N/A)  Anesthesia Type: MAC       Last Vitals:  Vitals:   05/14/16 1445 05/14/16 1508  BP: (!) 125/45 (!) 147/63  Pulse:    Resp:    Temp:      Last Pain:  Vitals:   05/14/16 1440  TempSrc: Oral  PainSc: 7                  Maliya Marich COKER

## 2016-05-14 NOTE — Discharge Instructions (Signed)

## 2016-05-14 NOTE — Interval H&P Note (Signed)
History and Physical Interval Note:  05/14/2016 1:57 PM  Kayla [name]  has presented today for surgery, with the diagnosis of .common bile duct problem  The various methods of treatment have been discussed with the patient and family. After consideration of risks, benefits and other options for treatment, the patient has consented to  Procedure(s) with comments: UPPER ESOPHAGEAL ENDOSCOPIC ULTRASOUND (EUS) (N/A) - radial only-will be done mod if needed as a surgical intervention .  The patient's history has been reviewed, patient examined, no change in status, stable for surgery.  I have reviewed the patient's chart and labs.  Questions were answered to the patient's satisfaction.     Milus Banister

## 2016-05-14 NOTE — H&P (View-Only) (Signed)
Review of pertinent gastrointestinal problems: 1. Chronic cholecystitis: 2017 ultrasound was normal. She was having epigastric pain, tenderness. CT scan 2017 unrevealing. Eventual HIDA scan with ejection fraction of the gallbladder showed ejection fraction of 18% suggesting possible chronic cholecystitis with biliary dyskinesia. Shee met with a surgeon and was going to have elective laparoscopic cholecystectomy but presented with worsening abdominal pain in her gallbladder was removed while in the hospital. December 2017 laparoscopic cholecystectomy Dr. Alphonsa Overall was uneventful, Intra-Op cholangiogram suggested possible filling defects in the common bile duct. She was seen by one of my partners Dr. Rachael Darby his review of the [place] he felt that there were probably not filling defects, the liver tests were normal.   HPI: This is a   very pleasant 76 year old woman who is here with her husband today  Chief complaint is persistent right upper quadrant abdominal pain  She had her gallbladder removed about 2 weeks ago and her nausea is gone but she is still suffering with right upper quadrant abdominal pain. She says this is no better than prior to when her gallbladder was removed.  The nausea is gone.  She still has significant RUQ pains. Does not seem related to eating. The pain is fairly constant. It started 7-8 weeks ago.  ROS: complete GI ROS as described in HPI.  Constitutional:  No unintentional weight loss   Past Medical History:  Diagnosis Date  . Arthritis   . CAD (coronary artery disease)    Mild per remote cath in 2006, /    Nuclear, January, 2013, no significant abnormality,  . Carotid artery disease ([place])    Doppler, January, 2013, 0 01% RIC A., 74-94% LICA, stable  . Complication of anesthesia    wakes up "mean"  . COPD (chronic obstructive pulmonary disease) ([place])   . Diabetes mellitus, type 2 ([place])   . Diverticula of colon   . Dizziness    occasional  . Ejection fraction    . GERD (gastroesophageal reflux disease)   . Goiter    hx of  . Headache(784.0)    migraine  . Hyperlipidemia   . Hypothyroidism   . Left ventricular hypertrophy   . Leg edema    occasional swelling  . Obesity   . OSA (obstructive sleep apnea)    cpap setting of 12  . PONV (postoperative nausea and vomiting)    severe after every surgery  . Pulmonary hypertension, secondary   . Sleep apnea     Past Surgical History:  Procedure Laterality Date  . ABDOMINAL HYSTERECTOMY     with appendectomy and colon polypectomy  . ANTERIOR CERVICAL DECOMP/DISCECTOMY FUSION N/A 11/07/2014   Procedure: Anterior Cervical diskectomy and fusion Cervical four-five, Cervical five-six, Cervical six-seven;  Surgeon: Kary Kos, MD;  Location: [facility];  Service: Neurosurgery;  Laterality: N/A;  . CHOLECYSTECTOMY N/A 04/29/2016   Procedure: LAPAROSCOPIC CHOLECYSTECTOMY WITH INTRAOPERATIVE CHOLANGIOGRAM;  Surgeon: Alphonsa Overall, MD;  Location: [facility];  Service: General;  Laterality: N/A;  . EP IMPLANTABLE DEVICE N/A 11/08/2015   Procedure: Pacemaker Implant;  Surgeon: Deboraha Sprang, MD;  Location: [facility];  Service: Cardiovascular;  Laterality: N/A;  . LUMBAR DISC SURGERY  1990's   x 2  . THYROIDECTOMY  2011  . TOTAL KNEE ARTHROPLASTY  07/27/2011   Procedure: TOTAL KNEE ARTHROPLASTY;  Surgeon: Mauri Pole, MD;  Location: [facility];  Service: Orthopedics;  Laterality: Left;    Current Outpatient Prescriptions  Medication Sig Dispense Refill  .  acetaminophen (TYLENOL) 500 MG tablet Take 1,000 mg by mouth every 8 (eight) hours as needed for mild pain or moderate pain.    Marland Kitchen albuterol (PROAIR HFA) 108 (90 Base) MCG/ACT inhaler Inhale 2 puffs into the lungs every 6 (six) hours as needed for wheezing or shortness of breath. 3 Inhaler 3  . albuterol (PROVENTIL) (2.5 MG/3ML) 0.083% nebulizer solution Take 2.5 mg by nebulization every 6 (six) hours as needed for wheezing or shortness of breath.    Marland Kitchen  amLODipine (NORVASC) 5 MG tablet Take 5 mg by mouth daily.     Marland Kitchen aspirin 81 MG tablet Take 81 mg by mouth daily.    . budesonide-formoterol (SYMBICORT) 160-4.5 MCG/ACT inhaler Inhale 2 puffs into the lungs 2 (two) times daily. 3 Inhaler 3  . Cholecalciferol (D3-1000 PO) Take 1 tablet by mouth daily.    . Cyanocobalamin (B-12 PO) Take 1 tablet by mouth daily.    . fluticasone (FLONASE) 50 MCG/ACT nasal spray USE 2 SPRAYS NASALLY DAILY (Patient taking differently: Place 2 sprays into both nostrils 2 (two) times daily. ) 48 g 3  . furosemide (LASIX) 20 MG tablet Take 1 tablet (20 mg total) by mouth daily. 90 tablet 3  . gabapentin (NEURONTIN) 600 MG tablet Take 600 mg by mouth 3 (three) times daily.     Marland Kitchen HYDROcodone-acetaminophen (NORCO/VICODIN) 5-325 MG tablet Take 1-2 tablets by mouth every 6 (six) hours as needed for moderate pain. 30 tablet 0  . insulin glargine (LANTUS) 100 UNIT/ML injection Inject 100 Units into the skin 2 (two) times daily.     Marland Kitchen levothyroxine (SYNTHROID, LEVOTHROID) 200 MCG tablet Take 200 mcg by mouth daily.     Marland Kitchen LORazepam (ATIVAN) 2 MG tablet Take 2 mg by mouth daily as needed for anxiety. FOR DEPRESSION    . metFORMIN (GLUCOPHAGE) 500 MG tablet Take 1,000 mg by mouth 2 (two) times daily with a meal.    . nitroGLYCERIN (NITROSTAT) 0.4 MG SL tablet Place 1 tablet (0.4 mg total) under the tongue every 5 (five) minutes as needed for chest pain. 25 tablet 3  . omeprazole (PRILOSEC) 20 MG capsule Take 20 mg by mouth daily.    . ondansetron (ZOFRAN-ODT) 4 MG disintegrating tablet Take 1 tablet (4 mg total) by mouth every 6 (six) hours as needed for nausea. 20 tablet 0  . PHENADOZ 25 MG suppository Place 2.5 mg rectally daily as needed (headaches).     . simvastatin (ZOCOR) 20 MG tablet Take 20 mg by mouth daily.     . valsartan-hydrochlorothiazide (DIOVAN-HCT) 320-25 MG tablet Take 1 tablet by mouth daily.     No current facility-administered medications for this visit.      Allergies as of 05/11/2016  . (No Known Allergies)    Family History  Problem Relation Age of Onset  . Heart Problems Mother   . Heart defect Mother     valve problems  . Heart Problems Father   . Heart disease Brother   . Lung cancer Daughter     non small cell   . Diabetes Maternal Aunt   . Diabetes Maternal Uncle     Social History   Social History  . Marital status: Married    Spouse name: Jeneen Rinks  . Number of children: 3  . Years of education: 12   Occupational History  . retired    Social History Main Topics  . Smoking status: Former Smoker    Packs/day: 1.50    Years:  34.00    Types: Cigarettes    Quit date: 05/04/1997  . Smokeless tobacco: Never Used  . Alcohol use No  . Drug use: No  . Sexual activity: Not on file   Other Topics Concern  . Not on file   Social History Narrative   Patient lives at home with her husband Jeneen Rinks .   Retired.   Education 12 th grade.   Right handed   Caffeine two cups of coffee.     Physical Exam: BP (!) 150/58   Pulse 72   Ht '5\' 5"'  (1.651 m)   Wt 222 lb (100.7 kg)   BMI 36.94 kg/m  Constitutional: generally well-appearing Psychiatric: alert and oriented x3 Abdomen: soft, nontender, nondistended, no obvious ascites, no peritoneal signs, normal bowel sounds No peripheral edema noted in lower extremities  Assessment and plan: 76 y.o. female with Persistent right upper quadrant pain, abnormal intra-cholangiogram 2 weeks ago  She has had ultrasound, CAT scan, HIDA scan, multiple lab tests. She had her gallbladder removed 2 weeks ago. None of these tests, procedures have helped her abdominal pains or explain them sufficiently. I was hoping she feel better since she had adhesions and chronic cholecystitis noted on pathology. Given question of retained gallstones on IOC I think she deserves further biliary workup. Unfortunately she has a pacemaker and so this cannot be done by MRI must instead be done invasively with an  endoscopic ultrasound. That we'll also give me time to examine the inside of her upper GI tract with endoscopy. We will try to get this done for her this week. I will also get some results from last week [place] lab testing here for review.    Owens Loffler, MD [facility] 05/11/2016, 8:43 AM

## 2016-05-14 NOTE — Anesthesia Postprocedure Evaluation (Signed)
Anesthesia Post Note  Patient: Kayla [name]  Procedure(s) Performed: Procedure(s) (LRB): UPPER ESOPHAGEAL ENDOSCOPIC ULTRASOUND (EUS) (N/A)  Patient location during evaluation: Endoscopy Anesthesia Type: MAC Level of consciousness: awake, awake and alert and oriented Pain management: pain level controlled Vital Signs Assessment: post-procedure vital signs reviewed and stable Respiratory status: spontaneous breathing, nonlabored ventilation and respiratory function stable Cardiovascular status: blood pressure returned to baseline Anesthetic complications: no       Last Vitals:  Vitals:   05/14/16 1445 05/14/16 1508  BP: (!) 125/45 (!) 147/63  Pulse:    Resp:    Temp:      Last Pain:  Vitals:   05/14/16 1440  TempSrc: Oral  PainSc: 7                  Nova Schmuhl COKER

## 2016-05-14 NOTE — Anesthesia Preprocedure Evaluation (Signed)
Anesthesia Evaluation  Patient identified by MRN, date of birth, ID band Patient awake    Reviewed: Allergy & Precautions, NPO status , Patient's Chart, lab work & pertinent test results  Airway Mallampati: II  TM Distance: >3 FB Neck ROM: Full    Dental  (+) Edentulous Upper, Dental Advisory Given   Pulmonary former smoker,    breath sounds clear to auscultation       Cardiovascular  Rhythm:Regular Rate:Normal     Neuro/Psych    GI/Hepatic   Endo/Other  diabetes  Renal/GU      Musculoskeletal   Abdominal (+) + obese,   Peds  Hematology   Anesthesia Other Findings   Reproductive/Obstetrics                             Anesthesia Physical Anesthesia Plan  ASA: III  Anesthesia Plan: MAC   Post-op Pain Management:    Induction: Intravenous  Airway Management Planned: Natural Airway and Nasal Cannula  Additional Equipment:   Intra-op Plan:   Post-operative Plan:   Informed Consent: I have reviewed the patients History and Physical, chart, labs and discussed the procedure including the risks, benefits and alternatives for the proposed anesthesia with the patient or authorized representative who has indicated his/her understanding and acceptance.   Dental advisory given  Plan Discussed with: CRNA  Anesthesia Plan Comments:         Anesthesia Quick Evaluation

## 2016-05-14 NOTE — Transfer of Care (Signed)
Immediate Anesthesia Transfer of Care Note  Patient: Kayla [name]  Procedure(s) Performed: Procedure(s) with comments: UPPER ESOPHAGEAL ENDOSCOPIC ULTRASOUND (EUS) (N/A) - radial only-will be done mod if needed  Patient Location: PACU  Anesthesia Type:MAC  Level of Consciousness:  sedated, patient cooperative and responds to stimulation  Airway & Oxygen Therapy:Patient Spontanous Breathing and Patient connected to face mask oxgen  Post-op Assessment:  Report given to PACU RN and Post -op Vital signs reviewed and stable  Post vital signs:  Reviewed and stable  Last Vitals:  Vitals:   05/14/16 1319 05/14/16 1320  BP:  (!) 156/72  Pulse:  70  Resp:  (!) 9  Temp: 61.9 C     Complications: No apparent anesthesia complications

## 2016-05-14 NOTE — Op Note (Signed)
[facility] Patient Name: Kayla [name] Procedure Date: 05/14/2016 MRN: [id number] Attending MD: Milus Banister , MD Date of Birth: [birth year] CSN: [id number] Age: 76 Admit Type: Outpatient Procedure:                Upper EUS Indications:              Epigastric abdominal pain, Abdominal pain in the                            right upper quadrant Providers:                Milus Banister, MD, Cleda Daub, RN, Corliss Parish, Technician Referring MD:              Medicines:                Monitored Anesthesia Care Complications:            No immediate complications. Estimated blood loss:                            None. Estimated Blood Loss:     Estimated blood loss: none. Procedure:                Pre-Anesthesia Assessment:                           - Prior to the procedure, a History and Physical                            was performed, and patient medications and                            allergies were reviewed. The patient's tolerance of                            previous anesthesia was also reviewed. The risks                            and benefits of the procedure and the sedation                            options and risks were discussed with the patient.                            All questions were answered, and informed consent                            was obtained. Prior Anticoagulants: The patient has                            taken no previous anticoagulant or antiplatelet                            agents. ASA Grade Assessment:  II - A patient with                            mild systemic disease. After reviewing the risks                            and benefits, the patient was deemed in                            satisfactory condition to undergo the procedure.                           After obtaining informed consent, the endoscope was                            passed under direct vision. Throughout the                          procedure, the patient's blood pressure, pulse, and                            oxygen saturations were monitored continuously. The                            [id number] ([id number]) scope was introduced through the                            mouth, and advanced to the second part of duodenum.                            The [id number] ([id number]) scope was introduced                            through the mouth, and advanced to the second part                            of duodenum. The [id number] ([id number]) scope was                            introduced through the mouth, and advanced to the                            second part of duodenum. The upper EUS was                            accomplished without difficulty. The patient                            tolerated the procedure well. Scope In: Scope Out: Findings:      Endoscopic Finding (with standard gastroscope and side viewing       duodenoscope) :      1. The examined esophagus was endoscopically normal.      2. There was moderate inflammation characterized by erythema and  granularity was found in the gastric antrum. Biopsies were taken with a       cold forceps for Helicobacter pylori testing.      3. There was a medium to large sized periampullary duodenal diverticulum       without any signs of actue inflammation, infection.      Endosonographic Finding :      1. There was significant air artifact from the duodenal diveritculum and       so the imaging of the distal bile duct was less than idea however no       stones were seen in the bile duct and the CBD was 71mm in diameter.      2. Pancreatic parenchyma was normal throughout the gland.      3. The main pancreatic duct was normal      4. Limited views of liver, spleen, portal and splenic vessels were all       normal Impression:               - Moderate distal gastritis, this was biopsied and                            sent to pathology.                            - Periampullary duodenal diverticulum without signs                            of actute infection, inflammation. Moderate Sedation:      N/A- Per Anesthesia Care Recommendation:           - Discharge patient to home (ambulatory).                           - Resume previous diet.                           - Continue present medications.                           - If biopsies show H. pylori then you will be                            started on appropriate antibiotics. Procedure Code(s):        --- Professional ---                           [phone], Esophagogastroduodenoscopy, flexible,                            transoral; with endoscopic ultrasound examination                            limited to the esophagus, stomach or duodenum, and                            adjacent structures Diagnosis Code(s):        --- Professional ---  R10.13, Epigastric pain                           R10.11, Right upper quadrant pain CPT copyright 2016 [employer]. All rights reserved. The codes documented in this report are preliminary and upon coder review may  be revised to meet current compliance requirements. Milus Banister, MD 05/14/2016 2:41:11 PM This report has been signed electronically. Number of Addenda: 0

## 2016-05-15 ENCOUNTER — Telehealth: Payer: Self-pay | Admitting: Gastroenterology

## 2016-05-15 ENCOUNTER — Other Ambulatory Visit: Payer: Self-pay

## 2016-05-15 ENCOUNTER — Encounter (HOSPITAL_COMMUNITY): Payer: Self-pay | Admitting: Gastroenterology

## 2016-05-15 MED ORDER — BIS SUBCIT-METRONID-TETRACYC 140-125-125 MG PO CAPS: 3.0000 | ORAL_CAPSULE | Freq: Three times a day (TID) | ORAL | 0 refills | Status: DC

## 2016-05-15 MED ORDER — OMEPRAZOLE 20 MG PO CPDR: 20.0000 mg | DELAYED_RELEASE_CAPSULE | Freq: Every day | ORAL | 0 refills | Status: DC

## 2016-05-15 NOTE — Telephone Encounter (Signed)
See result note.  

## 2016-05-15 NOTE — Telephone Encounter (Signed)
Sheri , can you check results note from path today.  I messed up the routing and so I"m sending this to alert you about it. thanks

## 2016-05-15 NOTE — Telephone Encounter (Signed)
I have spoken to patient to advise that we have a 10 day course of Pylera if she would like to pick it up and start treatment and she is willing to pick up the additional 4 days worth of med once available (Drug rep needs to bring additional med in). I have placed Pylera at the front desk for patient pick up. She also verbalizes understanding to take omeprazole 20 mg twice daily while on pylera.

## 2016-05-18 NOTE — Telephone Encounter (Signed)
Reassigned information regarding patient to Lillard Anes, NP, who patient will be seen in the near future

## 2016-05-19 ENCOUNTER — Ambulatory Visit: Payer: Medicare HMO | Admitting: *Deleted

## 2016-05-19 ENCOUNTER — Ambulatory Visit (INDEPENDENT_AMBULATORY_CARE_PROVIDER_SITE_OTHER): Payer: Medicare HMO | Admitting: *Deleted

## 2016-05-19 ENCOUNTER — Telehealth: Payer: Self-pay | Admitting: Cardiology

## 2016-05-19 DIAGNOSIS — I495 Sick sinus syndrome: Secondary | ICD-10-CM

## 2016-05-19 NOTE — Telephone Encounter (Signed)
Spoke with pt and reminded pt of remote transmission that is due today. Pt verbalized understanding.   

## 2016-05-22 ENCOUNTER — Encounter: Payer: Self-pay | Admitting: Cardiology

## 2016-05-22 NOTE — Telephone Encounter (Signed)
Left voicemail advising patient that the additional 4 days worth of Pylera has been placed at the front desk for patient to pick up.

## 2016-05-27 ENCOUNTER — Telehealth: Payer: Self-pay | Admitting: Gastroenterology

## 2016-05-27 NOTE — Telephone Encounter (Signed)
Pt notified that it may take up to 6 weeks to feel better after taking pylera.  Pt will call back in a few weeks if she does not have relief.

## 2016-06-08 ENCOUNTER — Ambulatory Visit: Payer: Commercial Managed Care - HMO | Admitting: Adult Health

## 2016-06-09 ENCOUNTER — Ambulatory Visit: Payer: Medicare HMO | Admitting: Adult Health

## 2016-06-09 NOTE — Progress Notes (Signed)
Remote pacemaker transmission.   

## 2016-06-10 ENCOUNTER — Encounter: Payer: Self-pay | Admitting: Cardiology

## 2016-06-11 LAB — CUP PACEART REMOTE DEVICE CHECK
Battery Remaining Longevity: 120 mo
Battery Voltage: 3.03 V
Brady Statistic AP VP Percent: 0.12 %
Brady Statistic AP VS Percent: 47.01 %
Brady Statistic AS VP Percent: 0.08 %
Brady Statistic AS VS Percent: 52.79 %
Brady Statistic RA Percent Paced: 47.12 %
Brady Statistic RV Percent Paced: 0.25 %
Date Time Interrogation Session: 20180116185400
Implantable Lead Implant Date: 20170707
Implantable Lead Implant Date: 20170707
Implantable Lead Location: 753859
Implantable Lead Location: 753860
Implantable Lead Model: 3830
Implantable Lead Model: 5076
Implantable Pulse Generator Implant Date: 20170707
Lead Channel Impedance Value: 304 Ohm
Lead Channel Impedance Value: 342 Ohm
Lead Channel Impedance Value: 399 Ohm
Lead Channel Impedance Value: 418 Ohm
Lead Channel Pacing Threshold Amplitude: 1.125 V
Lead Channel Pacing Threshold Amplitude: 1.875 V
Lead Channel Pacing Threshold Pulse Width: 0.4 ms
Lead Channel Pacing Threshold Pulse Width: 0.4 ms
Lead Channel Sensing Intrinsic Amplitude: 6.25 mV
Lead Channel Sensing Intrinsic Amplitude: 6.25 mV
Lead Channel Sensing Intrinsic Amplitude: 7 mV
Lead Channel Sensing Intrinsic Amplitude: 7 mV
Lead Channel Setting Pacing Amplitude: 2.25 V
Lead Channel Setting Pacing Amplitude: 2.5 V
Lead Channel Setting Pacing Pulse Width: 1 ms
Lead Channel Setting Sensing Sensitivity: 2.8 mV

## 2016-06-17 ENCOUNTER — Telehealth: Payer: Self-pay | Admitting: Gastroenterology

## 2016-06-17 DIAGNOSIS — R109 Unspecified abdominal pain: Secondary | ICD-10-CM

## 2016-06-17 NOTE — Telephone Encounter (Signed)
Pt finished Pylera 2 weeks ago and continues to have Lower right abdominal  pain radiating to the back.  Pt states it is severe and interferes with her life.  It seems to be getting worse.  Wants to know what else she can do.  Has to take pain meds and lay on her right side is the only relief she has.  Please advise

## 2016-06-18 NOTE — Telephone Encounter (Signed)
I don't know what is causing her pain, cannot treat it without a better understanding and I recommend repeat CT to hopefully understand the pain better.  Thanks

## 2016-06-18 NOTE — Telephone Encounter (Signed)
Please have labs in our basement lab   You have been scheduled for a CT scan of the abdomen and pelvis at [facility] ([address]---this is in the same building as [facility]).   You are scheduled on 06/24/16 at 3 pm. You should arrive 15 minutes prior to your appointment time for registration. Please follow the written instructions below on the day of your exam:  WARNING: IF YOU ARE ALLERGIC TO IODINE/X-RAY DYE, PLEASE NOTIFY RADIOLOGY IMMEDIATELY AT [phone]! YOU WILL BE GIVEN A 13 HOUR PREMEDICATION PREP.  1) Do not eat or drink anything after 11 am (4 hours prior to your test) 2) You have been given 2 bottles of oral contrast to drink. The solution may taste better if refrigerated, but do NOT add ice or any other liquid to this solution. Shake well before drinking.    Drink 1 bottle of contrast @ 1 pm (2 hours prior to your exam)  Drink 1 bottle of contrast @ 2 pm (1 hour prior to your exam)  You may take any medications as prescribed with a small amount of water except for the following: Metformin, Glucophage, Glucovance, Avandamet, Riomet, Fortamet, Actoplus Met, Janumet, Glumetza or Metaglip. The above medications must be held the day of the exam AND 48 hours after the exam.  The purpose of you drinking the oral contrast is to aid in the visualization of your intestinal tract. The contrast solution may cause some diarrhea. Before your exam is started, you will be given a small amount of fluid to drink. Depending on your individual set of symptoms, you may also receive an intravenous injection of x-ray contrast/dye. [facility] on [facility] 30 minutes or longer, depending on the type of exam you are having performed.  This test typically takes 30-45 minutes to complete.  If you have any questions regarding your exam or if you need to reschedule, you may call the CT department at [phone] between the hours of 8:00 am and 5:00 pm,  Monday-Friday.  The patient has been notified of this information and all questions answered.

## 2016-06-18 NOTE — Telephone Encounter (Signed)
Lets get CT scan abd/pelvis with IV and PO contrast. Repeat labs (cbc, cmet) also.  Thanks

## 2016-06-18 NOTE — Telephone Encounter (Signed)
The pt had a CT 03/25/16 and she says she does not think she needs another one.  She says she doesn't know what it could show that the other did not.  She just wants the pain to stop.  Please advise

## 2016-06-22 ENCOUNTER — Other Ambulatory Visit (INDEPENDENT_AMBULATORY_CARE_PROVIDER_SITE_OTHER): Payer: Medicare HMO

## 2016-06-22 DIAGNOSIS — R109 Unspecified abdominal pain: Secondary | ICD-10-CM | POA: Diagnosis not present

## 2016-06-22 LAB — COMPREHENSIVE METABOLIC PANEL
ALT: 15 U/L (ref 0–35)
AST: 18 U/L (ref 0–37)
Albumin: 4.3 g/dL (ref 3.5–5.2)
Alkaline Phosphatase: 79 U/L (ref 39–117)
BUN: 26 mg/dL — ABNORMAL HIGH (ref 6–23)
CO2: 26 mEq/L (ref 19–32)
Calcium: 9.9 mg/dL (ref 8.4–10.5)
Chloride: 105 mEq/L (ref 96–112)
Creatinine, Ser: 1.09 mg/dL (ref 0.40–1.20)
GFR: 51.98 mL/min — ABNORMAL LOW (ref >60.00)
Glucose, Bld: 226 mg/dL — ABNORMAL HIGH (ref 70–99)
Potassium: 4.8 mEq/L (ref 3.5–5.1)
Sodium: 140 mEq/L (ref 135–145)
Total Bilirubin: 0.3 mg/dL (ref 0.2–1.2)
Total Protein: 7.8 g/dL (ref 6.0–8.3)

## 2016-06-22 LAB — CBC WITH DIFFERENTIAL/PLATELET
Basophils Absolute: 0.1 10*3/uL (ref 0.0–0.1)
Basophils Relative: 1 % (ref 0.0–3.0)
Eosinophils Absolute: 0.2 10*3/uL (ref 0.0–0.7)
Eosinophils Relative: 1.9 % (ref 0.0–5.0)
HCT: 38.4 % (ref 36.0–46.0)
Hemoglobin: 12.4 g/dL (ref 12.0–15.0)
Lymphocytes Relative: 27.7 % (ref 12.0–46.0)
Lymphs Abs: 3 10*3/uL (ref 0.7–4.0)
MCHC: 32.3 g/dL (ref 30.0–36.0)
MCV: 89.2 fl (ref 78.0–100.0)
Monocytes Absolute: 0.8 10*3/uL (ref 0.1–1.0)
Monocytes Relative: 7.3 % (ref 3.0–12.0)
Neutro Abs: 6.7 10*3/uL (ref 1.4–7.7)
Neutrophils Relative %: 62.1 % (ref 43.0–77.0)
Platelets: 471 10*3/uL — ABNORMAL HIGH (ref 150.0–400.0)
RBC: 4.31 Mil/uL (ref 3.87–5.11)
RDW: 14.8 % (ref 11.5–15.5)
WBC: 10.8 10*3/uL — ABNORMAL HIGH (ref 4.0–10.5)

## 2016-06-24 ENCOUNTER — Ambulatory Visit (INDEPENDENT_AMBULATORY_CARE_PROVIDER_SITE_OTHER)
Admission: RE | Admit: 2016-06-24 | Discharge: 2016-06-24 | Disposition: A | Discharge status: Home / Self Care | Payer: Medicare HMO | Source: Ambulatory Visit | Attending: Gastroenterology | Admitting: Gastroenterology

## 2016-06-24 DIAGNOSIS — R109 Unspecified abdominal pain: Secondary | ICD-10-CM

## 2016-06-24 DIAGNOSIS — R1110 Vomiting, unspecified: Secondary | ICD-10-CM | POA: Diagnosis not present

## 2016-06-24 MED ORDER — IOPAMIDOL (ISOVUE-300) INJECTION 61%
100.0000 mL | Freq: Once | INTRAVENOUS | Status: AC | PRN
  Administered 2016-06-24: 100 mL via INTRAVENOUS

## 2016-06-26 ENCOUNTER — Other Ambulatory Visit: Payer: Self-pay

## 2016-06-26 ENCOUNTER — Telehealth: Payer: Self-pay | Admitting: Gastroenterology

## 2016-06-26 MED ORDER — HYOSCYAMINE SULFATE 0.125 MG SL SUBL: 0.1250 mg | SUBLINGUAL_TABLET | Freq: Four times a day (QID) | SUBLINGUAL | 3 refills | Status: DC | PRN

## 2016-06-26 NOTE — Telephone Encounter (Signed)
Pt calling for CT results, please advise. °

## 2016-07-20 ENCOUNTER — Encounter: Payer: Self-pay | Admitting: Pulmonary Disease

## 2016-07-20 ENCOUNTER — Ambulatory Visit (INDEPENDENT_AMBULATORY_CARE_PROVIDER_SITE_OTHER)
Admission: RE | Admit: 2016-07-20 | Discharge: 2016-07-20 | Disposition: A | Discharge status: Home / Self Care | Payer: Medicare HMO | Source: Ambulatory Visit | Attending: Pulmonary Disease | Admitting: Pulmonary Disease

## 2016-07-20 ENCOUNTER — Ambulatory Visit (INDEPENDENT_AMBULATORY_CARE_PROVIDER_SITE_OTHER): Payer: Medicare HMO | Admitting: Pulmonary Disease

## 2016-07-20 VITALS — BP 138/64 | HR 89 | Ht 66.0 in | Wt 229.2 lb

## 2016-07-20 DIAGNOSIS — R091 Pleurisy: Secondary | ICD-10-CM

## 2016-07-20 DIAGNOSIS — Z79899 Other long term (current) drug therapy: Secondary | ICD-10-CM | POA: Diagnosis not present

## 2016-07-20 DIAGNOSIS — E1140 Type 2 diabetes mellitus with diabetic neuropathy, unspecified: Secondary | ICD-10-CM | POA: Diagnosis not present

## 2016-07-20 DIAGNOSIS — G4733 Obstructive sleep apnea (adult) (pediatric): Secondary | ICD-10-CM | POA: Diagnosis not present

## 2016-07-20 DIAGNOSIS — J449 Chronic obstructive pulmonary disease, unspecified: Secondary | ICD-10-CM

## 2016-07-20 DIAGNOSIS — E890 Postprocedural hypothyroidism: Secondary | ICD-10-CM | POA: Diagnosis not present

## 2016-07-20 DIAGNOSIS — Z9989 Dependence on other enabling machines and devices: Secondary | ICD-10-CM

## 2016-07-20 DIAGNOSIS — Z6836 Body mass index (BMI) 36.0-36.9, adult: Secondary | ICD-10-CM | POA: Diagnosis not present

## 2016-07-20 DIAGNOSIS — R079 Chest pain, unspecified: Secondary | ICD-10-CM | POA: Diagnosis not present

## 2016-07-20 DIAGNOSIS — F419 Anxiety disorder, unspecified: Secondary | ICD-10-CM | POA: Diagnosis not present

## 2016-07-20 DIAGNOSIS — E1149 Type 2 diabetes mellitus with other diabetic neurological complication: Secondary | ICD-10-CM | POA: Diagnosis not present

## 2016-07-20 DIAGNOSIS — Z1389 Encounter for screening for other disorder: Secondary | ICD-10-CM | POA: Diagnosis not present

## 2016-07-20 DIAGNOSIS — E669 Obesity, unspecified: Secondary | ICD-10-CM | POA: Diagnosis not present

## 2016-07-20 DIAGNOSIS — E782 Mixed hyperlipidemia: Secondary | ICD-10-CM | POA: Diagnosis not present

## 2016-07-20 MED ORDER — PREDNISONE 10 MG PO TABS: ORAL_TABLET | ORAL | 18 refills | Status: DC

## 2016-07-20 NOTE — Progress Notes (Signed)
Current Outpatient Prescriptions on File Prior to Visit  Medication Sig  . acetaminophen (TYLENOL) 500 MG tablet Take 1,000 mg by mouth every 8 (eight) hours as needed for mild pain or moderate pain.  Marland Kitchen albuterol (PROAIR HFA) 108 (90 Base) MCG/ACT inhaler Inhale 2 puffs into the lungs every 6 (six) hours as needed for wheezing or shortness of breath.  Marland Kitchen albuterol (PROVENTIL) (2.5 MG/3ML) 0.083% nebulizer solution Take 2.5 mg by nebulization every 6 (six) hours as needed for wheezing or shortness of breath.  Marland Kitchen amLODipine (NORVASC) 5 MG tablet Take 5 mg by mouth daily.   Marland Kitchen aspirin 81 MG tablet Take 81 mg by mouth daily.  Marland Kitchen bismuth-metronidazole-tetracycline (PYLERA) 140-125-125 MG capsule Take 3 capsules by mouth 4 (four) times daily -  before meals and at bedtime.  . budesonide-formoterol (SYMBICORT) 160-4.5 MCG/ACT inhaler Inhale 2 puffs into the lungs 2 (two) times daily.  . Cholecalciferol (D3-1000 PO) Take 1,000 Units by mouth daily.   . fluticasone (FLONASE) 50 MCG/ACT nasal spray USE 2 SPRAYS NASALLY DAILY (Patient taking differently: Place 2 sprays into both nostrils 2 (two) times daily. )  . gabapentin (NEURONTIN) 600 MG tablet Take 600 mg by mouth 3 (three) times daily.   Marland Kitchen HYDROcodone-acetaminophen (NORCO/VICODIN) 5-325 MG tablet Take 1-2 tablets by mouth every 6 (six) hours as needed for moderate pain. (Patient taking differently: Take 1-2 tablets by mouth every 4 (four) hours as needed for moderate pain. )  . hyoscyamine (LEVSIN SL) 0.125 MG SL tablet Place 1 tablet (0.125 mg total) under the tongue every 6 (six) hours as needed.  . insulin glargine (LANTUS) 100 UNIT/ML injection Inject 100 Units into the skin 2 (two) times daily.   Marland Kitchen levothyroxine (SYNTHROID, LEVOTHROID) 200 MCG tablet Take 200 mcg by mouth daily.   Marland Kitchen LORazepam (ATIVAN) 2 MG tablet Take 2 mg by mouth daily as needed for anxiety. FOR DEPRESSION  . metFORMIN (GLUCOPHAGE) 500 MG tablet Take 1,000 mg by mouth 2 (two) times  daily with a meal.  . nitroGLYCERIN (NITROSTAT) 0.4 MG SL tablet Place 1 tablet (0.4 mg total) under the tongue every 5 (five) minutes as needed for chest pain.  Marland Kitchen omeprazole (PRILOSEC) 20 MG capsule Take 1 capsule (20 mg total) by mouth daily.  . ondansetron (ZOFRAN-ODT) 4 MG disintegrating tablet Take 1 tablet (4 mg total) by mouth every 6 (six) hours as needed for nausea.  Marland Kitchen PHENADOZ 25 MG suppository Place 25 mg rectally daily as needed (headaches).   . simvastatin (ZOCOR) 20 MG tablet Take 20 mg by mouth every evening.   . valsartan-hydrochlorothiazide (DIOVAN-HCT) 320-25 MG tablet Take 1 tablet by mouth daily.  . vitamin B-12 (CYANOCOBALAMIN) 1000 MCG tablet Take 1,000 mcg by mouth daily.  . furosemide (LASIX) 20 MG tablet Take 1 tablet (20 mg total) by mouth daily.   No current facility-administered medications on file prior to visit.     Chief Complaint  Patient presents with  . Acute Visit    Pt c/o increased SOB and pain in lower right lung. Still using CPAP nightly. Pt denies any current issues with mask fit or pressure setting. DME: [facility].     Sleep tests PSG 02/2005 >> AHI 10  Auto CPAP 09/21/12 to 10/05/12 >> used on 15 of 15 nights with average 10 hrs 22 min.  Average AHI 1.8 with mean CPAP 5 cm H2O and 90 th percentile CPAP 6 cm H2O. CPAP titration 02/19/14 >> CPAP 11 cm H2O >> AHI 0.7,  PLMI 81.4, did not need supplemental oxygen  Pulmonary tests Spirometry 10/19/08 >> FEV1 1.55 (68%), FEV1% 70  Spirometry 08/29/12 >> FEV1 1.38 (59%), FEV1% 58 FeNO 03/09/16 >> 7  Cardiac tests Echo 07/31/13 >> mild LVH, EF 55 to 60%, grade 1 DD  Past medical history GERD, HLD, DM, hypothyroidism, CAD, s/p PM  Past medical history, Family history, Social history, Allergies reviewed  Vital signs BP 138/64 (BP Location: Left Arm, Cuff Size: Normal)   Pulse 89   Ht 5\' 6"  (1.676 m)   Wt 229 lb 3.2 oz (104 kg)   SpO2 95%   BMI 36.99 kg/m    History of Present Illness: Kayla [name] is a 76 y.o. female with COPD and OSA using CPAP.  She has noticed pain in her right chest and upper abdomen.  She was also having nausea.  She was found to have gallstones and ended up having laparoscopic cholecystectomy.  Her nausea improved, but she is still having pain.  She feels this when she takes a deep breath, but can get it at times when she is sitting.  If she lays on her right side the pain subsides some.  She is not having fever, cough, wheeze, sputum, fever.  She had CT abd/pelvis 06/24/16 >> expected changes after cholecystectomy.  Physical Exam:  General - pleasant Eyes - wears glasses HEENT - no sinus tenderness, no oral exudate, no LAN Cardiac - regular, no murmur Chest - no wheeze, mild dullness Rt lower lung field, no rales Abdomen - soft, mild tenderness RUQ, no rebound/guarding, +bowel sounds, well healed trochar sites Extremities - no edema Skin - no rashes Neurologic - normal strength Psychiatric - normal mood   Dg Chest 2 View  Result Date: 07/20/2016 CLINICAL DATA:  Right-sided chest pain for 2 months.  Pleurisy. EXAM: CHEST  2 VIEW COMPARISON:  None. FINDINGS: The heart size is normal. Pacing wires are stable. The lungs are clear. There is no edema or effusion. Mild hyperinflation is stable. Degenerative changes of the thoracic spine are stable. IMPRESSION: 1. No acute cardiopulmonary disease or significant interval change. Electronically Signed   By: San Morelle M.D.   On: 07/20/2016 15:14    CMP Latest Ref Rng & Units 06/22/2016 04/30/2016 04/28/2016  Glucose 70 - 99 mg/dL 226(H) 162(H) 101(H)  BUN 6 - 23 mg/dL 26(H) 16 26(H)  Creatinine 0.40 - 1.20 mg/dL 1.09 1.04(H) 1.10(H)  Sodium 135 - 145 mEq/L 140 137 140  Potassium 3.5 - 5.1 mEq/L 4.8 5.2(H) 4.4  Chloride 96 - 112 mEq/L 105 104 107  CO2 19 - 32 mEq/L 26 24 24   Calcium 8.4 - 10.5 mg/dL 9.9 9.3 9.7  Total Protein 6.0 - 8.3 g/dL 7.8 7.2 8.0  Total Bilirubin 0.2 - 1.2 mg/dL 0.3 0.5 0.3   Alkaline Phos 39 - 117 U/L 79 69 84  AST 0 - 37 U/L 18 52(H) 22  ALT 0 - 35 U/L 15 27 16     CBC Latest Ref Rng & Units 06/22/2016 04/28/2016 03/23/2016  WBC 4.0 - 10.5 K/uL 10.8(H) 9.7 12.0(H)  Hemoglobin 12.0 - 15.0 g/dL 12.4 12.7 11.5(L)  Hematocrit 36.0 - 46.0 % 38.4 39.4 34.9(L)  Platelets 150.0 - 400.0 K/uL 471.0(H) 392 422.0(H)      Assessment/Plan:  Right pleuritic. - it is not clear what is causing her pain symptoms - will give her course of prednisone - if no improvement, then she might need neurology assessment to determine if she is  having neuropathic pain  COPD with chronic bronchitis. - continue symbicort and prn albuterol  Obstructive sleep apnea. - she is compliant with CPAP and reports benefit from therapy - continue auto CPAP   Patient Instructions  Prednisone 10 mg pills >> 3 pills daily for 3 days, 2 pills daily for 3 days, 1 pill daily for 3 days  Call at the end of the week to update status  Follow up in 6 months   Chesley Mires, MD [place] 07/20/2016, 3:25 PM Pager:  [phone]

## 2016-07-20 NOTE — Patient Instructions (Signed)
Prednisone 10 mg pills >> 3 pills daily for 3 days, 2 pills daily for 3 days, 1 pill daily for 3 days  Call at the end of the week to update status  Follow up in 6 months

## 2016-07-24 ENCOUNTER — Telehealth: Payer: Self-pay | Admitting: Pulmonary Disease

## 2016-07-24 NOTE — Telephone Encounter (Signed)
Spoke with pt and made her aware of VS message. Pt understood and had no further questions. Nothing further is needed at this time.

## 2016-07-24 NOTE — Telephone Encounter (Signed)
She should finish current prescription.  If symptoms do not recur, then she doesn't need refill of prednisone.

## 2016-07-24 NOTE — Telephone Encounter (Signed)
Spoke with pt, states that she is doing well on prednisone given at 3/19 office visit, would like to know how to proceed with pred taper as she was given several refills on rx.    VS please advise if pt needs to continue prednisone beyond taper of 30mg  X3 days, 20mg  X3 days, 10mg  X3 days.  Thanks.

## 2016-07-31 DIAGNOSIS — G4733 Obstructive sleep apnea (adult) (pediatric): Secondary | ICD-10-CM | POA: Diagnosis not present

## 2016-08-13 DIAGNOSIS — E669 Obesity, unspecified: Secondary | ICD-10-CM | POA: Diagnosis not present

## 2016-08-13 DIAGNOSIS — R109 Unspecified abdominal pain: Secondary | ICD-10-CM | POA: Diagnosis not present

## 2016-08-13 DIAGNOSIS — Z6837 Body mass index (BMI) 37.0-37.9, adult: Secondary | ICD-10-CM | POA: Diagnosis not present

## 2016-08-21 ENCOUNTER — Telehealth: Payer: Self-pay

## 2016-08-21 ENCOUNTER — Encounter: Payer: Self-pay | Admitting: Gastroenterology

## 2016-08-21 ENCOUNTER — Ambulatory Visit (AMBULATORY_SURGERY_CENTER): Payer: Self-pay

## 2016-08-21 VITALS — Ht 66.0 in | Wt 233.8 lb

## 2016-08-21 DIAGNOSIS — R1011 Right upper quadrant pain: Secondary | ICD-10-CM

## 2016-08-21 MED ORDER — SUPREP BOWEL PREP KIT 17.5-3.13-1.6 GM/177ML PO SOLN: 1.0000 | Freq: Once | ORAL | 0 refills | Status: AC

## 2016-08-21 NOTE — Progress Notes (Signed)
No diet meds No home oxygen but uses CPAP No allergies to eggs or soy No past problems with anesthesia Except PONV  No internet

## 2016-08-21 NOTE — Telephone Encounter (Signed)
Dr Ardis Hughs,      Please talk with pt again when she comes for procedure on 08/28/16 concerning her TYLENOL/ACETAMINOPHEN intake.  She is overdosing by taking narcotic containing Tylenol and additional Tylenol.  I spoke with her about this as much as I could but she didn't seem convinced.  Also she has been advised not to take ibuprofen or Aleve due to how it may affect her kidneys.  She was in tears during her previsit as she is exhausted and tired of being in so much pain but I fear this may be clouding her judgement when it comes to her self-medicating. Thank you, Kingsten Enfield/PV

## 2016-08-22 NOTE — Telephone Encounter (Signed)
Thanks for the information.    Kayla [name]

## 2016-08-24 ENCOUNTER — Ambulatory Visit (INDEPENDENT_AMBULATORY_CARE_PROVIDER_SITE_OTHER): Payer: Medicare HMO | Admitting: Cardiology

## 2016-08-24 ENCOUNTER — Encounter: Payer: Self-pay | Admitting: Cardiology

## 2016-08-24 VITALS — BP 150/60 | HR 90 | Ht 65.0 in | Wt 229.8 lb

## 2016-08-24 DIAGNOSIS — I495 Sick sinus syndrome: Secondary | ICD-10-CM | POA: Diagnosis not present

## 2016-08-24 DIAGNOSIS — I493 Ventricular premature depolarization: Secondary | ICD-10-CM | POA: Diagnosis not present

## 2016-08-24 DIAGNOSIS — Z950 Presence of cardiac pacemaker: Secondary | ICD-10-CM

## 2016-08-24 MED ORDER — METOPROLOL TARTRATE 25 MG PO TABS: 25.0000 mg | ORAL_TABLET | Freq: Two times a day (BID) | ORAL | 3 refills | Status: DC

## 2016-08-24 NOTE — Progress Notes (Signed)
Cardiology Office Note    Date:  08/24/2016   ID:  [name], [name] [birth year], MRN [id number]  PCP:  [name], MD  Cardiologist:   Candee Furbish, MD     History of Present Illness:  Kayla [name] is a 76 y.o. female former patient of Dr. Ron Parker also followed by Dr. Caryl Comes for pacemaker here for follow-up. Had cholecystectomy on 04/29/16 for biliary dyskinesia. Was seen previously on 10/09/14 by Margarite Gouge for preoperative risk assessment for Dr. Saintclair Halsted, surgical cervical fusion.   Overall she has been doing very well. She is going have a colonoscopy next Friday. This is fine for her to proceed with this. She's not having any chest pain, no syncope, no further dizziness. Her energy level seems to have improved. Her blood pressure has been in the 150 range at home. At one point her metoprolol was stopped because of her bradycardia but now since she has a pacemaker, this has been restarted on 08/24/16.  Low risk Myoview in 2013, catheterization 2006 nonobstructive CAD echocardiogram 2015 normal EF  48 hour Holter monitor 2015 PACs, PVCs, 3 beat PAT.  Event recorder - junctional rhythm high 30's and low 40's at times associated with dizziness. This prompted pacer.  Carotid Dopplers 40-59% left internal carotid, 39% right.  History of COPD, long-standing smoking. Dr. Halford Chessman. Chronic dyspnea on exertion no change.   Past Medical History:  Diagnosis Date  . Allergy   . Anxiety   . Arthritis   . CAD (coronary artery disease)    Mild per remote cath in 2006, /    Nuclear, January, 2013, no significant abnormality,  . Carotid artery disease ([place])    Doppler, January, 2013, 0 73% RIC A., 53-29% LICA, stable  . Cataract   . Complication of anesthesia    wakes up "mean"  . COPD (chronic obstructive pulmonary disease) ([place])   . Diabetes mellitus, type 2 ([place])   . Diverticula of colon   . Dizziness    occasional  . Ejection fraction   . GERD (gastroesophageal reflux disease)     . Goiter    hx of  . Headache(784.0)    migraine  . Hyperlipidemia   . Hypertension   . Hypothyroidism   . Left ventricular hypertrophy   . Leg edema    occasional swelling  . Obesity   . OSA (obstructive sleep apnea)    cpap setting of 12  . PONV (postoperative nausea and vomiting)    severe after every surgery  . Pulmonary hypertension, secondary   . Sleep apnea     Past Surgical History:  Procedure Laterality Date  . ABDOMINAL HYSTERECTOMY     with appendectomy and colon polypectomy  . ANTERIOR CERVICAL DECOMP/DISCECTOMY FUSION N/A 11/07/2014   Procedure: Anterior Cervical diskectomy and fusion Cervical four-five, Cervical five-six, Cervical six-seven;  Surgeon: Kary Kos, MD;  Location: [facility];  Service: Neurosurgery;  Laterality: N/A;  . CHOLECYSTECTOMY N/A 04/29/2016   Procedure: LAPAROSCOPIC CHOLECYSTECTOMY WITH INTRAOPERATIVE CHOLANGIOGRAM;  Surgeon: Alphonsa Overall, MD;  Location: [facility];  Service: General;  Laterality: N/A;  . EP IMPLANTABLE DEVICE N/A 11/08/2015   Procedure: Pacemaker Implant;  Surgeon: Deboraha Sprang, MD;  Location: [facility];  Service: Cardiovascular;  Laterality: N/A;  . LUMBAR DISC SURGERY  1990's   x 2  . THYROIDECTOMY  2011  . TOTAL KNEE ARTHROPLASTY  07/27/2011   Procedure: TOTAL KNEE ARTHROPLASTY;  Surgeon: Mauri Pole, MD;  Location:  [facility];  Service: Orthopedics;  Laterality: Left;  . UPPER ESOPHAGEAL ENDOSCOPIC ULTRASOUND (EUS) N/A 05/14/2016   Procedure: UPPER ESOPHAGEAL ENDOSCOPIC ULTRASOUND (EUS);  Surgeon: Milus Banister, MD;  Location: [facility];  Service: Endoscopy;  Laterality: N/A;  radial only-will be done mod if needed    Current Medications: Outpatient Medications Prior to Visit  Medication Sig Dispense Refill  . acetaminophen (TYLENOL) 500 MG tablet Take 1,000 mg by mouth every 8 (eight) hours as needed for mild pain or moderate pain.    Marland Kitchen albuterol (PROAIR HFA) 108 (90 Base) MCG/ACT inhaler Inhale 2 puffs  into the lungs every 6 (six) hours as needed for wheezing or shortness of breath. 3 Inhaler 3  . albuterol (PROVENTIL) (2.5 MG/3ML) 0.083% nebulizer solution Take 2.5 mg by nebulization every 6 (six) hours as needed for wheezing or shortness of breath.    Marland Kitchen amLODipine (NORVASC) 5 MG tablet Take 5 mg by mouth daily.     Marland Kitchen aspirin 81 MG tablet Take 81 mg by mouth daily.    . budesonide-formoterol (SYMBICORT) 160-4.5 MCG/ACT inhaler Inhale 2 puffs into the lungs 2 (two) times daily. 3 Inhaler 3  . Cholecalciferol (D3-1000 PO) Take 1,000 Units by mouth daily.     . fluticasone (FLONASE) 50 MCG/ACT nasal spray USE 2 SPRAYS NASALLY DAILY (Patient taking differently: Place 2 sprays into both nostrils 2 (two) times daily. ) 48 g 3  . gabapentin (NEURONTIN) 600 MG tablet Take 600 mg by mouth 3 (three) times daily.     Marland Kitchen HYDROcodone-acetaminophen (NORCO/VICODIN) 5-325 MG tablet Take 1-2 tablets by mouth every 6 (six) hours as needed for moderate pain. (Patient taking differently: Take 1-2 tablets by mouth every 4 (four) hours as needed for moderate pain. ) 30 tablet 0  . insulin glargine (LANTUS) 100 UNIT/ML injection Inject 100 Units into the skin 2 (two) times daily.     Marland Kitchen levothyroxine (SYNTHROID, LEVOTHROID) 200 MCG tablet Take 200 mcg by mouth daily.     Marland Kitchen LORazepam (ATIVAN) 2 MG tablet Take 2 mg by mouth daily as needed for anxiety. FOR DEPRESSION    . metFORMIN (GLUCOPHAGE) 500 MG tablet Take 1,000 mg by mouth 2 (two) times daily with a meal.    . nitroGLYCERIN (NITROSTAT) 0.4 MG SL tablet Place 1 tablet (0.4 mg total) under the tongue every 5 (five) minutes as needed for chest pain. 25 tablet 3  . omeprazole (PRILOSEC) 20 MG capsule Take 1 capsule (20 mg total) by mouth daily. 28 capsule 0  . ondansetron (ZOFRAN-ODT) 4 MG disintegrating tablet Take 1 tablet (4 mg total) by mouth every 6 (six) hours as needed for nausea. 20 tablet 0  . PHENADOZ 25 MG suppository Place 25 mg rectally daily as needed  (headaches).     . simvastatin (ZOCOR) 20 MG tablet Take 20 mg by mouth every evening.     . valsartan-hydrochlorothiazide (DIOVAN-HCT) 320-25 MG tablet Take 1 tablet by mouth daily.    . vitamin B-12 (CYANOCOBALAMIN) 1000 MCG tablet Take 1,000 mcg by mouth daily.     No facility-administered medications prior to visit.      Allergies:   Patient has no known allergies.   Social History   Social History  . Marital status: Married    Spouse name: Jeneen Rinks  . Number of children: 3  . Years of education: 12   Occupational History  . retired    Social History Main Topics  . Smoking status: Former Smoker  Packs/day: 1.50    Years: 34.00    Types: Cigarettes    Quit date: 05/04/1997  . Smokeless tobacco: Never Used  . Alcohol use No  . Drug use: No  . Sexual activity: Not Asked   Other Topics Concern  . None   Social History Narrative   Patient lives at home with her husband Jeneen Rinks .   Retired.   Education 12 th grade.   Right handed   Caffeine two cups of coffee.     Family History:  The patient's family history includes Diabetes in her maternal aunt and maternal uncle; Heart Problems in her father and mother; Heart defect in her mother; Heart disease in her brother; Lung cancer in her daughter.   ROS:   Please see the history of present illness.   ROS unless specified above all other review of systems negative. No syncope, no dizziness, no bleeding.   PHYSICAL EXAM:   VS:  BP (!) 150/60   Pulse 90   Ht 5\' 5"  (1.651 m)   Wt 229 lb 12.8 oz (104.2 kg)   SpO2 97%   BMI 38.24 kg/m    GEN: Well nourished, well developed, in no acute distress  HEENT: normal cervical neck scar noted. Neck: no JVD, carotid bruits, or masses Cardiac: Give the rate and rhythm with no appreciable murmurs. Pacemaker left sided in place, site looks normal Respiratory:  No wheezes, normal work of breathing GI: soft, nontender, nondistended, + BS,  MS: no deformity or atrophy  Skin: warm and  dry, no rash, upper arm varicosities noted. Neuro:  Alert and Oriented x 3, Strength and sensation are intact Psych: euthymic mood, full affect  Wt Readings from Last 3 Encounters:  08/24/16 229 lb 12.8 oz (104.2 kg)  08/21/16 233 lb 12.8 oz (106.1 kg)  07/20/16 229 lb 3.2 oz (104 kg)      Studies/Labs Reviewed:   EKG:  EKG is ordered today.  The ekg ordered today demonstrates 08/22/15-sinus bradycardia with first-degree AV block of 216 ms, no other abnormalities. Personally viewed.  Recent Labs: 11/08/2015: TSH 0.123 06/22/2016: ALT 15; BUN 26; Creatinine, Ser 1.09; Hemoglobin 12.4; Platelets 471.0; Potassium 4.8; Sodium 140   Lipid Panel No results found for: CHOL, TRIG, HDL, CHOLHDL, VLDL, LDLCALC, LDLDIRECT  Additional studies/ records that were reviewed today include:  Former office notes, echocardiogram, carotid Dopplers reviewed     ASSESSMENT:    1. Sinus node dysfunction (HCC)   2. Cardiac pacemaker in situ   3. PVC (premature ventricular contraction)      PLAN:  In order of problems listed above:  Nonobstructive coronary artery disease -Continue with current medical management, optimize diabetes management, statin, blood pressure control. Overall she is having no anginal symptoms. Stress test in 2013 was low risk, reassuring. Normal ejection fraction.  Pacemaker-[employer] - Dr. Caryl Comes. Doing well. - Seems to be main active cardiology issue at this point.   - We will restart metoprolol 25 mg twice a day. This will help with her palpitations as well. She has a pacemaker for backup. Her blood pressure was a little high today. It is like this at home.  Palpitations -PVCs/PACs seen on prior event monitor. Continue with metoprolol. No changes. Has pacemaker for backup  Diabetes with peripheral neuropathy -Gabapentin. Overall doing moderately well. Does not appear to be claudication. Per primary team.  Morbid obesity -Continue to encourage weight loss. This will  continue to help her overall.   Carotid artery disease -Carotid  Dopplers from 08/2015 overall in the mild plaque range bilaterally. We will follow-up with carotid Dopplers on an as-needed basis at this point. I do not appreciate any significant bruits on exam. Prevention, diet, exercise, BP control, lipid control.   Essential hypertension -Currently medications reviewed. -- was on metoprolol at one point.   Chronic kidney disease stage III -Dr. Lisbeth Ply is watching creatinine closely especially with her spironolactone.  COPD -Dr.Sood one-year follow-up.    Medication Adjustments/Labs and Tests Ordered: Current medicines are reviewed at length with the patient today.  Concerns regarding medicines are outlined above.  Medication changes, Labs and Tests ordered today are listed in the Patient Instructions below. Patient Instructions  Medication Instructions:  Please restart Metoprolol tartrate 25 mg twice a day. Continue all other medications as listed. Follow-Up: Follow up in 1 year with Dr. Marlou Porch.  You will receive a letter in the mail 2 months before you are due.  Please call us when you receive this letter to schedule your follow up appointment.  If you need a refill on your cardiac medications before your next appointment, please call your pharmacy.  Thank you for choosing [facility]!!        Signed, Candee Furbish, MD  08/24/2016 10:37 AM    [facility] [address], [place], [place]  [address] Phone: [phone]; Fax: [phone]

## 2016-08-24 NOTE — Patient Instructions (Signed)
Medication Instructions:  Please restart Metoprolol tartrate 25 mg twice a day. Continue all other medications as listed. Follow-Up: Follow up in 1 year with Dr. Marlou Porch.  You will receive a letter in the mail 2 months before you are due.  Please call us when you receive this letter to schedule your follow up appointment.  If you need a refill on your cardiac medications before your next appointment, please call your pharmacy.  Thank you for choosing [facility]!!

## 2016-08-28 ENCOUNTER — Ambulatory Visit (AMBULATORY_SURGERY_CENTER): Payer: Medicare HMO | Admitting: Gastroenterology

## 2016-08-28 ENCOUNTER — Encounter: Payer: Self-pay | Admitting: Gastroenterology

## 2016-08-28 VITALS — BP 129/64 | HR 69 | Temp 96.9°F | Resp 10 | Ht 66.0 in | Wt 233.0 lb

## 2016-08-28 DIAGNOSIS — K649 Unspecified hemorrhoids: Secondary | ICD-10-CM

## 2016-08-28 DIAGNOSIS — R109 Unspecified abdominal pain: Secondary | ICD-10-CM | POA: Diagnosis not present

## 2016-08-28 DIAGNOSIS — D125 Benign neoplasm of sigmoid colon: Secondary | ICD-10-CM

## 2016-08-28 DIAGNOSIS — R1011 Right upper quadrant pain: Secondary | ICD-10-CM

## 2016-08-28 DIAGNOSIS — D123 Benign neoplasm of transverse colon: Secondary | ICD-10-CM | POA: Diagnosis not present

## 2016-08-28 DIAGNOSIS — D124 Benign neoplasm of descending colon: Secondary | ICD-10-CM

## 2016-08-28 DIAGNOSIS — K635 Polyp of colon: Secondary | ICD-10-CM

## 2016-08-28 DIAGNOSIS — K5730 Diverticulosis of large intestine without perforation or abscess without bleeding: Secondary | ICD-10-CM

## 2016-08-28 MED ORDER — SODIUM CHLORIDE 0.9 % IV SOLN: 500.0000 mL | INTRAVENOUS | Status: DC

## 2016-08-28 NOTE — Patient Instructions (Signed)
YOU HAD AN ENDOSCOPIC PROCEDURE TODAY AT THE [facility]:   Refer to the procedure report that was given to you for any specific questions about what was found during the examination.  If the procedure report does not answer your questions, please call your gastroenterologist to clarify.  If you requested that your care partner not be given the details of your procedure findings, then the procedure report has been included in a sealed envelope for you to review at your convenience later.  YOU SHOULD EXPECT: Some feelings of bloating in the abdomen. Passage of more gas than usual.  Walking can help get rid of the air that was put into your GI tract during the procedure and reduce the bloating. If you had a lower endoscopy (such as a colonoscopy or flexible sigmoidoscopy) you may notice spotting of blood in your stool or on the toilet paper. If you underwent a bowel prep for your procedure, you may not have a normal bowel movement for a few days.  Please Note:  You might notice some irritation and congestion in your nose or some drainage.  This is from the oxygen used during your procedure.  There is no need for concern and it should clear up in a day or so.  SYMPTOMS TO REPORT IMMEDIATELY:   Following lower endoscopy (colonoscopy or flexible sigmoidoscopy):  Excessive amounts of blood in the stool  Significant tenderness or worsening of abdominal pains  Swelling of the abdomen that is new, acute  Fever of 100F or higher   Please read all handouts given to you by your recovery nurse.  For urgent or emergent issues, a gastroenterologist can be reached at any hour by calling [phone].   DIET:  We do recommend a small meal at first, but then you may proceed to your regular diet.  Drink plenty of fluids but you should avoid alcoholic beverages for 24 hours.  ACTIVITY:  You should plan to take it easy for the rest of today and you should NOT DRIVE or use heavy machinery until  tomorrow (because of the sedation medicines used during the test).    FOLLOW UP: Our staff will call the number listed on your records the next business day following your procedure to check on you and address any questions or concerns that you may have regarding the information given to you following your procedure. If we do not reach you, we will leave a message.  However, if you are feeling well and you are not experiencing any problems, there is no need to return our call.  We will assume that you have returned to your regular daily activities without incident.  If any biopsies were taken you will be contacted by phone or by letter within the next 1-3 weeks.  Please call us at [phone] if you have not heard about the biopsies in 3 weeks.    SIGNATURES/CONFIDENTIALITY: You and/or your care partner have signed paperwork which will be entered into your electronic medical record.  These signatures attest to the fact that that the information above on your After Visit Summary has been reviewed and is understood.  Full responsibility of the confidentiality of this discharge information lies with you and/or your care-partner.  Thank you for letting us take care of your healthcare needs today. 

## 2016-08-28 NOTE — Progress Notes (Signed)
To Pacu VSS. Report to RN

## 2016-08-28 NOTE — Op Note (Signed)
[facility] Patient Name: Kayla [name] Procedure Date: 08/28/2016 1:30 PM MRN: [id number] Endoscopist: Milus Banister , MD Age: 76 Referring MD:  Date of Birth: [birth year] Gender: Female Account #: [id number] Procedure:                Colonoscopy Indications:              Abdominal pain Medicines:                Monitored Anesthesia Care Procedure:                Pre-Anesthesia Assessment:                           - Prior to the procedure, a History and Physical                            was performed, and patient medications and                            allergies were reviewed. The patient's tolerance of                            previous anesthesia was also reviewed. The risks                            and benefits of the procedure and the sedation                            options and risks were discussed with the patient.                            All questions were answered, and informed consent                            was obtained. Prior Anticoagulants: The patient has                            taken no previous anticoagulant or antiplatelet                            agents. ASA Grade Assessment: III - A patient with                            severe systemic disease. After reviewing the risks                            and benefits, the patient was deemed in                            satisfactory condition to undergo the procedure.                           After obtaining informed consent, the colonoscope  was passed under direct vision. Throughout the                            procedure, the patient's blood pressure, pulse, and                            oxygen saturations were monitored continuously. The                            Colonoscope was introduced through the anus and                            advanced to the the terminal ileum. The colonoscopy                            was performed without difficulty. The  patient                            tolerated the procedure well. The quality of the                            bowel preparation was good. The terminal ileum,                            ileocecal valve, appendiceal orifice, and rectum                            were photographed. Scope In: 1:33:17 PM Scope Out: 1:56:07 PM Scope Withdrawal Time: 0 hours 15 minutes 2 seconds  Total Procedure Duration: 0 hours 22 minutes 50 seconds  Findings:                 Ten sessile polyps were found in the descending                            colon and transverse colon. The polyps were 3 to 8                            mm in size. These polyps were removed with a cold                            snare. Resection and retrieval were complete.                           A 15 mm polyp was found in the sigmoid colon. The                            polyp was pedunculated. The polyp was removed with                            a hot snare. Resection and retrieval were complete.  Multiple small and large-mouthed diverticula were                            found in the left colon.                           Internal hemorrhoids were found. The hemorrhoids                            were small.                           The exam was otherwise without abnormality on                            direct and retroflexion views. Complications:            No immediate complications. Estimated blood loss:                            None. Estimated Blood Loss:     Estimated blood loss: none. Impression:               - Ten 3 to 8 mm polyps in the descending colon and                            in the transverse colon, removed with a cold snare.                            Resected and retrieved.                           - One 15 mm polyp in the sigmoid colon, removed                            with a hot snare. Resected and retrieved.                           - Diverticulosis in the left colon.                            - Internal hemorrhoids.                           - It is not likely that any of these finding                            explain your acute intermittent abdominal pains.                            You have had 2CT scans, 1 HIDA scan, 1 EGD with                            EUS, lap chole with IOC, multiple blood tests; no  further GI testing is recommended for this, please                            consult with your PCP for any other tests,                            recommendations for the pain.                           - The examination was otherwise normal on direct                            and retroflexion views. Recommendation:           - Patient has a contact number available for                            emergencies. The signs and symptoms of potential                            delayed complications were discussed with the                            patient. Return to normal activities tomorrow.                            Written discharge instructions were provided to the                            patient.                           - Resume previous diet.                           - Continue present medications.                           You will receive a letter within 2-3 weeks with the                            pathology results and my final recommendations.                           If the polyp(s) is proven to be 'pre-cancerous' on                            pathology, you will need repeat colonoscopy in 3                            years. If the polyp(s) is NOT 'precancerous' on                            pathology then you should repeat colon cancer  screening in 10 years with colonoscopy without need                            for colon cancer screening by any method prior to                            then (including stool testing). Milus Banister, MD 08/28/2016 2:01:50 PM This report has  been signed electronically.

## 2016-08-28 NOTE — Progress Notes (Signed)
Called to room to assist during endoscopic procedure.  Patient ID and intended procedure confirmed with present staff. Received instructions for my participation in the procedure from the performing physician.  

## 2016-08-31 ENCOUNTER — Telehealth: Payer: Self-pay | Admitting: *Deleted

## 2016-08-31 NOTE — Telephone Encounter (Signed)
  Follow up Call-  Call back number 08/28/2016  Post procedure Call Back phone  # [phone]  Permission to leave phone message Yes  Some recent data might be hidden     Patient questions:  Do you have a fever, pain , or abdominal swelling? No. Pain Score  0 *  Have you tolerated food without any problems? Yes.    Have you been able to return to your normal activities? Yes.    Do you have any questions about your discharge instructions: Diet   No. Medications  No. Follow up visit  No.  Do you have questions or concerns about your Care? No.  Actions: * If pain score is 4 or above: No action needed, pain <4.

## 2016-09-07 ENCOUNTER — Ambulatory Visit: Payer: Medicare HMO | Admitting: Pulmonary Disease

## 2016-09-07 ENCOUNTER — Encounter: Payer: Self-pay | Admitting: Gastroenterology

## 2016-09-08 ENCOUNTER — Ambulatory Visit (INDEPENDENT_AMBULATORY_CARE_PROVIDER_SITE_OTHER): Payer: Medicare HMO | Admitting: *Deleted

## 2016-09-08 ENCOUNTER — Telehealth: Payer: Self-pay | Admitting: Cardiology

## 2016-09-08 DIAGNOSIS — I495 Sick sinus syndrome: Secondary | ICD-10-CM | POA: Diagnosis not present

## 2016-09-08 NOTE — Telephone Encounter (Signed)
Spoke with pt and reminded pt of remote transmission that is due today. Pt verbalized understanding.   

## 2016-09-08 NOTE — Progress Notes (Signed)
Remote pacemaker transmission.   

## 2016-09-09 LAB — CUP PACEART REMOTE DEVICE CHECK
Battery Remaining Longevity: 119 mo
Battery Voltage: 3.03 V
Brady Statistic AP VP Percent: 0.06 %
Brady Statistic AP VS Percent: 31.66 %
Brady Statistic AS VP Percent: 0.08 %
Brady Statistic AS VS Percent: 68.19 %
Brady Statistic RA Percent Paced: 31.7 %
Brady Statistic RV Percent Paced: 0.18 %
Date Time Interrogation Session: 20180508145816
Implantable Lead Implant Date: 20170707
Implantable Lead Implant Date: 20170707
Implantable Lead Location: 753859
Implantable Lead Location: 753860
Implantable Lead Model: 3830
Implantable Lead Model: 5076
Implantable Pulse Generator Implant Date: 20170707
Lead Channel Impedance Value: 285 Ohm
Lead Channel Impedance Value: 361 Ohm
Lead Channel Impedance Value: 418 Ohm
Lead Channel Impedance Value: 418 Ohm
Lead Channel Pacing Threshold Amplitude: 1 V
Lead Channel Pacing Threshold Amplitude: 1.875 V
Lead Channel Pacing Threshold Pulse Width: 0.4 ms
Lead Channel Pacing Threshold Pulse Width: 0.4 ms
Lead Channel Sensing Intrinsic Amplitude: 7 mV
Lead Channel Sensing Intrinsic Amplitude: 7 mV
Lead Channel Sensing Intrinsic Amplitude: 7.125 mV
Lead Channel Sensing Intrinsic Amplitude: 7.125 mV
Lead Channel Setting Pacing Amplitude: 2 V
Lead Channel Setting Pacing Amplitude: 2.5 V
Lead Channel Setting Pacing Pulse Width: 1 ms
Lead Channel Setting Sensing Sensitivity: 2.8 mV

## 2016-09-10 DIAGNOSIS — Z6837 Body mass index (BMI) 37.0-37.9, adult: Secondary | ICD-10-CM | POA: Diagnosis not present

## 2016-09-10 DIAGNOSIS — E669 Obesity, unspecified: Secondary | ICD-10-CM | POA: Diagnosis not present

## 2016-09-10 DIAGNOSIS — R1011 Right upper quadrant pain: Secondary | ICD-10-CM | POA: Diagnosis not present

## 2016-10-09 DIAGNOSIS — Z79899 Other long term (current) drug therapy: Secondary | ICD-10-CM | POA: Diagnosis not present

## 2016-10-09 DIAGNOSIS — M545 Low back pain: Secondary | ICD-10-CM | POA: Diagnosis not present

## 2016-10-09 DIAGNOSIS — Z6827 Body mass index (BMI) 27.0-27.9, adult: Secondary | ICD-10-CM | POA: Diagnosis not present

## 2016-10-09 DIAGNOSIS — J449 Chronic obstructive pulmonary disease, unspecified: Secondary | ICD-10-CM | POA: Diagnosis not present

## 2016-10-09 DIAGNOSIS — E669 Obesity, unspecified: Secondary | ICD-10-CM | POA: Diagnosis not present

## 2016-10-09 DIAGNOSIS — R1011 Right upper quadrant pain: Secondary | ICD-10-CM | POA: Diagnosis not present

## 2016-11-05 DIAGNOSIS — R1011 Right upper quadrant pain: Secondary | ICD-10-CM | POA: Diagnosis not present

## 2016-11-19 DIAGNOSIS — E669 Obesity, unspecified: Secondary | ICD-10-CM | POA: Diagnosis not present

## 2016-11-19 DIAGNOSIS — M545 Low back pain: Secondary | ICD-10-CM | POA: Diagnosis not present

## 2016-11-19 DIAGNOSIS — Z6837 Body mass index (BMI) 37.0-37.9, adult: Secondary | ICD-10-CM | POA: Diagnosis not present

## 2016-11-19 DIAGNOSIS — E559 Vitamin D deficiency, unspecified: Secondary | ICD-10-CM | POA: Diagnosis not present

## 2016-11-19 DIAGNOSIS — E1149 Type 2 diabetes mellitus with other diabetic neurological complication: Secondary | ICD-10-CM | POA: Diagnosis not present

## 2016-11-19 DIAGNOSIS — E890 Postprocedural hypothyroidism: Secondary | ICD-10-CM | POA: Diagnosis not present

## 2016-11-19 DIAGNOSIS — Z79899 Other long term (current) drug therapy: Secondary | ICD-10-CM | POA: Diagnosis not present

## 2016-11-19 DIAGNOSIS — Z23 Encounter for immunization: Secondary | ICD-10-CM | POA: Diagnosis not present

## 2016-11-19 DIAGNOSIS — E782 Mixed hyperlipidemia: Secondary | ICD-10-CM | POA: Diagnosis not present

## 2016-12-08 ENCOUNTER — Ambulatory Visit (INDEPENDENT_AMBULATORY_CARE_PROVIDER_SITE_OTHER): Payer: Medicare HMO | Admitting: Physician Assistant

## 2016-12-08 ENCOUNTER — Ambulatory Visit (INDEPENDENT_AMBULATORY_CARE_PROVIDER_SITE_OTHER): Payer: Medicare HMO | Admitting: *Deleted

## 2016-12-08 ENCOUNTER — Encounter: Payer: Self-pay | Admitting: Physician Assistant

## 2016-12-08 VITALS — BP 140/82 | HR 86 | Ht 65.0 in | Wt 236.2 lb

## 2016-12-08 DIAGNOSIS — Z950 Presence of cardiac pacemaker: Secondary | ICD-10-CM | POA: Diagnosis not present

## 2016-12-08 DIAGNOSIS — I495 Sick sinus syndrome: Secondary | ICD-10-CM

## 2016-12-08 DIAGNOSIS — I10 Essential (primary) hypertension: Secondary | ICD-10-CM

## 2016-12-08 MED ORDER — METOPROLOL TARTRATE 25 MG PO TABS: 25.0000 mg | ORAL_TABLET | Freq: Two times a day (BID) | ORAL | 3 refills | Status: DC

## 2016-12-08 NOTE — Patient Instructions (Addendum)
Medication Instructions:   Your physician recommends that you continue on your current medications as directed. Please refer to the Current Medication list given to you today.   If you need a refill on your cardiac medications before your next appointment, please call your pharmacy.  Labwork: NONE ORDERED  TODAY    Testing/Procedures: NONE ORDERED  TODAY    Follow-Up: Your physician wants you to follow-up in: [place] will receive a reminder letter in the mail two months in advance. If you don't receive a letter, please call our office to schedule the follow-up appointment.  Remote monitoring is used to monitor your Pacemaker of ICD from home. This monitoring reduces the number of office visits required to check your device to one time per year. It allows [place] to keep an eye on the functioning of your device to ensure it is working properly. You are scheduled for a device check from home on . 03-11-17 You may send your transmission at any time that day. If you have a wireless device, the transmission will be sent automatically. After your physician reviews your transmission, you will receive a postcard with your next transmission date.     Any Other Special Instructions Will Be Listed Below (If Applicable).

## 2016-12-08 NOTE — Progress Notes (Signed)
Cardiology Office Note Date:  12/08/2016  Patient ID:  Kayla [name], Kayla [name] [birth year], MRN [id number] PCP:  [name], MD  Cardiologist:  Dr. Marlou Porch Electrophysiologist: Dr. Caryl Comes Pulmonary: Dr. Halford Chessman    Chief Complaint: routine device visit  History of Present Illness: Kayla [name] is a 76 y.o. female with history of PPM, COPD, DM, HTN, HLD, OSA/CPAP, obesity, comes in today to be seen for Dr. Caryl Comes, last seen by him in Oct 2017, at that visit noted increased (though not markedly ) RV thresholds and programmed for safety margin, also mentioned L arm pain and Trop was checked and <0.03.  More recently saw Dr. Marlou Porch in April doing well, planned for colonoscopy, felt fine to proceed.  She denies any kind of CP or palpitations. No dizziness, near syncope or syncope.  She feels like her breathing is at it's baseline with her COPD, no changes or decline in her exertional capacity, she wears her CPAP nightly and is without symptoms of PND or orthopnea.  She wants to make sure her change in medicine still includes a diuretic finding that her hands/ankles have been swollen some of late.  Device information: MDT dual chamber PPM implanted 11/08/15, Dr. Caryl Comes, sinus node dyfunction, bradycardia, RV is in HIS posiion   Past Medical History:  Diagnosis Date  . Allergy   . Anxiety   . Arthritis   . CAD (coronary artery disease)    Mild per remote cath in 2006, /    Nuclear, January, 2013, no significant abnormality,  . Carotid artery disease ([place])    Doppler, January, 2013, 0 49% RIC A., 70-26% LICA, stable  . Cataract   . Complication of anesthesia    wakes up "mean"  . COPD (chronic obstructive pulmonary disease) ([place])   . Diabetes mellitus, type 2 ([place])   . Diverticula of colon   . Dizziness    occasional  . Ejection fraction   . GERD (gastroesophageal reflux disease)   . Goiter    hx of  . Headache(784.0)    migraine  . Hyperlipidemia   . Hypertension   .  Hypothyroidism   . Left ventricular hypertrophy   . Leg edema    occasional swelling  . Obesity   . OSA (obstructive sleep apnea)    cpap setting of 12  . PONV (postoperative nausea and vomiting)    severe after every surgery  . Pulmonary hypertension, secondary   . Sleep apnea     Past Surgical History:  Procedure Laterality Date  . ABDOMINAL HYSTERECTOMY     with appendectomy and colon polypectomy  . ANTERIOR CERVICAL DECOMP/DISCECTOMY FUSION N/A 11/07/2014   Procedure: Anterior Cervical diskectomy and fusion Cervical four-five, Cervical five-six, Cervical six-seven;  Surgeon: Kary Kos, MD;  Location: [facility];  Service: Neurosurgery;  Laterality: N/A;  . CHOLECYSTECTOMY N/A 04/29/2016   Procedure: LAPAROSCOPIC CHOLECYSTECTOMY WITH INTRAOPERATIVE CHOLANGIOGRAM;  Surgeon: Alphonsa Overall, MD;  Location: [facility];  Service: General;  Laterality: N/A;  . EP IMPLANTABLE DEVICE N/A 11/08/2015   Procedure: Pacemaker Implant;  Surgeon: Deboraha Sprang, MD;  Location: [facility];  Service: Cardiovascular;  Laterality: N/A;  . LUMBAR DISC SURGERY  1990's   x 2  . THYROIDECTOMY  2011  . TOTAL KNEE ARTHROPLASTY  07/27/2011   Procedure: TOTAL KNEE ARTHROPLASTY;  Surgeon: Mauri Pole, MD;  Location: [facility];  Service: Orthopedics;  Laterality: Left;  . UPPER ESOPHAGEAL ENDOSCOPIC ULTRASOUND (EUS) N/A 05/14/2016  Procedure: UPPER ESOPHAGEAL ENDOSCOPIC ULTRASOUND (EUS);  Surgeon: Milus Banister, MD;  Location: [facility];  Service: Endoscopy;  Laterality: N/A;  radial only-will be done mod if needed    Current Outpatient Prescriptions  Medication Sig Dispense Refill  . acetaminophen (TYLENOL) 500 MG tablet Take 1,000 mg by mouth every 8 (eight) hours as needed for mild pain or moderate pain.    Marland Kitchen albuterol (PROAIR HFA) 108 (90 Base) MCG/ACT inhaler Inhale 2 puffs into the lungs every 6 (six) hours as needed for wheezing or shortness of breath. 3 Inhaler 3  . albuterol (PROVENTIL) (2.5  MG/3ML) 0.083% nebulizer solution Take 2.5 mg by nebulization every 6 (six) hours as needed for wheezing or shortness of breath.    Marland Kitchen amLODipine (NORVASC) 5 MG tablet Take 5 mg by mouth daily.     Marland Kitchen aspirin 81 MG tablet Take 81 mg by mouth daily.    . budesonide-formoterol (SYMBICORT) 160-4.5 MCG/ACT inhaler Inhale 2 puffs into the lungs 2 (two) times daily. 3 Inhaler 3  . Cholecalciferol (D3-1000 PO) Take 1,000 Units by mouth daily.     . fluticasone (FLONASE) 50 MCG/ACT nasal spray USE 2 SPRAYS NASALLY DAILY (Patient taking differently: Place 2 sprays into both nostrils 2 (two) times daily. ) 48 g 3  . gabapentin (NEURONTIN) 600 MG tablet Take 600 mg by mouth 3 (three) times daily.     Marland Kitchen HYDROcodone-acetaminophen (NORCO/VICODIN) 5-325 MG tablet Take 1-2 tablets by mouth every 6 (six) hours as needed for moderate pain. (Patient taking differently: Take 1-2 tablets by mouth every 4 (four) hours as needed for moderate pain. ) 30 tablet 0  . insulin glargine (LANTUS) 100 UNIT/ML injection Inject 100 Units into the skin 2 (two) times daily.     Marland Kitchen levothyroxine (SYNTHROID, LEVOTHROID) 175 MCG tablet Take 175 mcg by mouth daily.    Marland Kitchen LORazepam (ATIVAN) 2 MG tablet Take 2 mg by mouth daily as needed for anxiety. FOR DEPRESSION    . losartan-hydrochlorothiazide (HYZAAR) 100-25 MG tablet Take 1 tablet by mouth daily.    . metFORMIN (GLUCOPHAGE) 500 MG tablet Take 1,000 mg by mouth 2 (two) times daily with a meal.    . metoprolol tartrate (LOPRESSOR) 25 MG tablet Take 1 tablet (25 mg total) by mouth 2 (two) times daily. 180 tablet 3  . nitroGLYCERIN (NITROSTAT) 0.4 MG SL tablet Place 1 tablet (0.4 mg total) under the tongue every 5 (five) minutes as needed for chest pain. 25 tablet 3  . omeprazole (PRILOSEC) 20 MG capsule Take 1 capsule (20 mg total) by mouth daily. 28 capsule 0  . ondansetron (ZOFRAN-ODT) 4 MG disintegrating tablet Take 1 tablet (4 mg total) by mouth every 6 (six) hours as needed for nausea.  20 tablet 0  . PHENADOZ 25 MG suppository Place 25 mg rectally daily as needed (headaches).     . simvastatin (ZOCOR) 20 MG tablet Take 20 mg by mouth every evening.     . vitamin B-12 (CYANOCOBALAMIN) 1000 MCG tablet Take 1,000 mcg by mouth daily.     Current Facility-Administered Medications  Medication Dose Route Frequency Provider Last Rate Last Dose  . 0.9 %  sodium chloride infusion  500 mL Intravenous Continuous Milus Banister, MD        Allergies:   Patient has no known allergies.   Social History:  The patient  reports that she quit smoking about 19 years ago. Her smoking use included Cigarettes. She has a 51.00 pack-year smoking  history. She has never used smokeless tobacco. She reports that she does not drink alcohol or use drugs.   Family History:  The patient's family history includes Diabetes in her maternal aunt and maternal uncle; Heart Problems in her father and mother; Heart defect in her mother; Heart disease in her brother; Lung cancer in her daughter; Throat cancer in her brother.  ROS:  Please see the history of present illness.  All other systems are reviewed and otherwise negative.   PHYSICAL EXAM:  VS:  BP 140/82   Pulse 86   Ht 5\' 5"  (1.651 m)   Wt 236 lb 3.2 oz (107.1 kg)   SpO2 95%   BMI 39.31 kg/m  BMI: Body mass index is 39.31 kg/m. Well nourished, well developed, in no acute distress  HEENT: normocephalic, atraumatic  Neck: no JVD, carotid bruits or masses Cardiac:  RRR; no significant murmurs, no rubs, or gallops Lungs:  CTA b/l, no wheezing, rhonchi or rales  Abd: soft, nontender, obese MS: no deformity or atrophy Ext:  no edema is appreciated in her LE or hands this afternoon Skin: warm and dry, no rash Neuro:  No gross deficits appreciated Psych: euthymic mood, full affect  PPM site is stable, no tethering or discomfort   EKG:  Done 02/18/16 A paced, V sensed PPM interrogation done today by industry and reviewed by myself: battery and  lead measurements are good, 63.6% AS/VS, 36.2% A pacing, total V pacing < 1%  Low risk Myoview in 2013,  catheterization 2006 nonobstructive CAD echocardiogram 2015 normal EF  48 hour Holter monitor 2015 PACs, PVCs, 3 beat PAT.  Event recorder - junctional rhythm high 30's and low 40's at times associated with dizziness. This prompted pacer.   Recent Labs: 06/22/2016: ALT 15; BUN 26; Creatinine, Ser 1.09; Hemoglobin 12.4; Platelets 471.0; Potassium 4.8; Sodium 140  No results found for requested labs within last 8760 hours.   CrCl cannot be calculated (Patient's most recent lab result is older than the maximum 21 days allowed.).   Wt Readings from Last 3 Encounters:  12/08/16 236 lb 3.2 oz (107.1 kg)  08/28/16 233 lb (105.7 kg)  08/24/16 229 lb 12.8 oz (104.2 kg)     Other studies reviewed: Additional studies/records reviewed today include: summarized above  ASSESSMENT AND PLAN:  1. PPM     Device functioning well, no changes made  2. HTN     Mildly elevated today  I do not appreciate any edema, her weight, though exam does not suggest fluid OL, we discussed importance of minimizing sodium in her diet and monitoring bother her observation of some hand/ankle swelling on/off, and her BP  Disposition: F/u with q 3 month remote transmission and in-clinic device visit 1 year, sooner if needed.  Current medicines are reviewed at length with the patient today.  The patient did not have any concerns regarding medicines.  Haywood Lasso, PA-C 12/08/2016 1:08 PM     [place] [place] [address] [place] [address] [phone] (office)  [phone] (fax)

## 2016-12-09 ENCOUNTER — Encounter: Payer: Self-pay | Admitting: Cardiology

## 2016-12-09 NOTE — Progress Notes (Signed)
Remote pacemaker transmission.   

## 2016-12-25 LAB — CUP PACEART REMOTE DEVICE CHECK
Battery Remaining Longevity: 111 mo
Battery Voltage: 3.02 V
Brady Statistic AP VP Percent: 0.07 %
Brady Statistic AP VS Percent: 22.67 %
Brady Statistic AS VP Percent: 0.07 %
Brady Statistic AS VS Percent: 77.19 %
Brady Statistic RA Percent Paced: 22.67 %
Brady Statistic RV Percent Paced: 0.15 %
Date Time Interrogation Session: 20180807142933
Implantable Lead Implant Date: 20170707
Implantable Lead Implant Date: 20170707
Implantable Lead Location: 753859
Implantable Lead Location: 753860
Implantable Lead Model: 3830
Implantable Lead Model: 5076
Implantable Pulse Generator Implant Date: 20170707
Lead Channel Impedance Value: 304 Ohm
Lead Channel Impedance Value: 361 Ohm
Lead Channel Impedance Value: 437 Ohm
Lead Channel Impedance Value: 437 Ohm
Lead Channel Pacing Threshold Amplitude: 1 V
Lead Channel Pacing Threshold Amplitude: 1.875 V
Lead Channel Pacing Threshold Pulse Width: 0.4 ms
Lead Channel Pacing Threshold Pulse Width: 0.4 ms
Lead Channel Sensing Intrinsic Amplitude: 5.875 mV
Lead Channel Sensing Intrinsic Amplitude: 5.875 mV
Lead Channel Sensing Intrinsic Amplitude: 7 mV
Lead Channel Sensing Intrinsic Amplitude: 7 mV
Lead Channel Setting Pacing Amplitude: 2 V
Lead Channel Setting Pacing Amplitude: 2.5 V
Lead Channel Setting Pacing Pulse Width: 1 ms
Lead Channel Setting Sensing Sensitivity: 2.8 mV

## 2017-01-05 ENCOUNTER — Telehealth: Payer: Self-pay | Admitting: Pulmonary Disease

## 2017-01-05 MED ORDER — DOXYCYCLINE HYCLATE 100 MG PO TABS: 100.0000 mg | ORAL_TABLET | Freq: Two times a day (BID) | ORAL | 0 refills | Status: DC

## 2017-01-05 NOTE — Telephone Encounter (Signed)
Spoke with patient. She stated that she has some chest congestion. She has been using her inhalers and nebulizer without any relief. She has a productive cough with yellowish-brown phlegm. She stated that she has taken prednisone for this in the past, but it runs her glucose levels up. She wants to know what VS recommends for her.   She wishes to use [facility] on [address].   VS, please advise. Thanks!

## 2017-01-05 NOTE — Telephone Encounter (Signed)
Spoke with pt, aware of recs.  rx sent to preferred pharmacy.  Nothing further needed.  

## 2017-01-05 NOTE — Telephone Encounter (Signed)
Can send script for doxycycline 100 mg bid, #14 with no refills.  If antibiotic doesn't improve her symptoms, then she should call back and we can give her low dose short course of prednisone.

## 2017-01-07 DIAGNOSIS — E890 Postprocedural hypothyroidism: Secondary | ICD-10-CM | POA: Diagnosis not present

## 2017-01-11 ENCOUNTER — Other Ambulatory Visit: Payer: Self-pay | Admitting: Pulmonary Disease

## 2017-01-14 DIAGNOSIS — Z23 Encounter for immunization: Secondary | ICD-10-CM | POA: Diagnosis not present

## 2017-01-14 DIAGNOSIS — Z9181 History of falling: Secondary | ICD-10-CM | POA: Diagnosis not present

## 2017-01-14 DIAGNOSIS — Z139 Encounter for screening, unspecified: Secondary | ICD-10-CM | POA: Diagnosis not present

## 2017-01-14 DIAGNOSIS — Z6839 Body mass index (BMI) 39.0-39.9, adult: Secondary | ICD-10-CM | POA: Diagnosis not present

## 2017-01-14 DIAGNOSIS — Z1389 Encounter for screening for other disorder: Secondary | ICD-10-CM | POA: Diagnosis not present

## 2017-01-14 DIAGNOSIS — Z136 Encounter for screening for cardiovascular disorders: Secondary | ICD-10-CM | POA: Diagnosis not present

## 2017-01-14 DIAGNOSIS — E669 Obesity, unspecified: Secondary | ICD-10-CM | POA: Diagnosis not present

## 2017-01-14 DIAGNOSIS — Z0000 Encounter for general adult medical examination without abnormal findings: Secondary | ICD-10-CM | POA: Diagnosis not present

## 2017-01-14 DIAGNOSIS — E785 Hyperlipidemia, unspecified: Secondary | ICD-10-CM | POA: Diagnosis not present

## 2017-01-20 DIAGNOSIS — J441 Chronic obstructive pulmonary disease with (acute) exacerbation: Secondary | ICD-10-CM | POA: Diagnosis not present

## 2017-01-20 DIAGNOSIS — M546 Pain in thoracic spine: Secondary | ICD-10-CM | POA: Diagnosis not present

## 2017-01-20 DIAGNOSIS — M545 Low back pain: Secondary | ICD-10-CM | POA: Diagnosis not present

## 2017-01-20 DIAGNOSIS — Z6838 Body mass index (BMI) 38.0-38.9, adult: Secondary | ICD-10-CM | POA: Diagnosis not present

## 2017-01-20 DIAGNOSIS — E1149 Type 2 diabetes mellitus with other diabetic neurological complication: Secondary | ICD-10-CM | POA: Diagnosis not present

## 2017-02-03 DIAGNOSIS — M546 Pain in thoracic spine: Secondary | ICD-10-CM | POA: Diagnosis not present

## 2017-02-03 DIAGNOSIS — M545 Low back pain: Secondary | ICD-10-CM | POA: Diagnosis not present

## 2017-02-03 DIAGNOSIS — Z6838 Body mass index (BMI) 38.0-38.9, adult: Secondary | ICD-10-CM | POA: Diagnosis not present

## 2017-02-04 ENCOUNTER — Other Ambulatory Visit: Payer: Self-pay | Admitting: Family Medicine

## 2017-02-04 DIAGNOSIS — M546 Pain in thoracic spine: Principal | ICD-10-CM

## 2017-02-04 DIAGNOSIS — G8929 Other chronic pain: Secondary | ICD-10-CM

## 2017-02-05 ENCOUNTER — Telehealth: Payer: Self-pay | Admitting: Internal Medicine

## 2017-02-05 NOTE — Telephone Encounter (Signed)
New message    Olin Hauser from [facility] in [place] is calling. She said she called yesterday but [facility] is requiring that they find out the type of pacemaker and the company and a rep name and phone number. Please call.

## 2017-02-05 NOTE — Telephone Encounter (Signed)
Spoke with pamela and gave device information as well as industry phone number in preparation for MRI.

## 2017-02-12 DIAGNOSIS — H60311 Diffuse otitis externa, right ear: Secondary | ICD-10-CM | POA: Diagnosis not present

## 2017-02-12 DIAGNOSIS — J019 Acute sinusitis, unspecified: Secondary | ICD-10-CM | POA: Diagnosis not present

## 2017-02-15 ENCOUNTER — Encounter
Admission: RE | Admit: 2017-02-15 | Discharge: 2017-02-15 | Disposition: A | Discharge status: Home / Self Care | Payer: Medicare HMO | Source: Ambulatory Visit | Attending: Vascular Surgery | Admitting: Vascular Surgery

## 2017-02-15 ENCOUNTER — Other Ambulatory Visit (INDEPENDENT_AMBULATORY_CARE_PROVIDER_SITE_OTHER): Payer: Self-pay | Admitting: Vascular Surgery

## 2017-02-15 ENCOUNTER — Ambulatory Visit (INDEPENDENT_AMBULATORY_CARE_PROVIDER_SITE_OTHER): Payer: Medicare HMO | Admitting: Vascular Surgery

## 2017-02-15 ENCOUNTER — Encounter (INDEPENDENT_AMBULATORY_CARE_PROVIDER_SITE_OTHER): Payer: Self-pay | Admitting: Vascular Surgery

## 2017-02-15 ENCOUNTER — Other Ambulatory Visit (INDEPENDENT_AMBULATORY_CARE_PROVIDER_SITE_OTHER): Payer: Self-pay

## 2017-02-15 ENCOUNTER — Encounter (INDEPENDENT_AMBULATORY_CARE_PROVIDER_SITE_OTHER): Payer: Self-pay

## 2017-02-15 VITALS — BP 171/71 | HR 73 | Resp 18 | Ht 66.0 in | Wt 238.0 lb

## 2017-02-15 DIAGNOSIS — E039 Hypothyroidism, unspecified: Secondary | ICD-10-CM | POA: Diagnosis not present

## 2017-02-15 DIAGNOSIS — E1151 Type 2 diabetes mellitus with diabetic peripheral angiopathy without gangrene: Secondary | ICD-10-CM | POA: Diagnosis not present

## 2017-02-15 DIAGNOSIS — Z6838 Body mass index (BMI) 38.0-38.9, adult: Secondary | ICD-10-CM | POA: Diagnosis not present

## 2017-02-15 DIAGNOSIS — E0821 Diabetes mellitus due to underlying condition with diabetic nephropathy: Secondary | ICD-10-CM | POA: Diagnosis not present

## 2017-02-15 DIAGNOSIS — Z01810 Encounter for preprocedural cardiovascular examination: Secondary | ICD-10-CM | POA: Diagnosis not present

## 2017-02-15 DIAGNOSIS — G4489 Other headache syndrome: Secondary | ICD-10-CM

## 2017-02-15 DIAGNOSIS — E669 Obesity, unspecified: Secondary | ICD-10-CM | POA: Diagnosis not present

## 2017-02-15 DIAGNOSIS — R51 Headache: Secondary | ICD-10-CM | POA: Diagnosis not present

## 2017-02-15 DIAGNOSIS — E7800 Pure hypercholesterolemia, unspecified: Secondary | ICD-10-CM

## 2017-02-15 DIAGNOSIS — Z9889 Other specified postprocedural states: Secondary | ICD-10-CM

## 2017-02-15 DIAGNOSIS — Z79899 Other long term (current) drug therapy: Secondary | ICD-10-CM | POA: Diagnosis not present

## 2017-02-15 DIAGNOSIS — I2510 Atherosclerotic heart disease of native coronary artery without angina pectoris: Secondary | ICD-10-CM | POA: Diagnosis not present

## 2017-02-15 DIAGNOSIS — G4730 Sleep apnea, unspecified: Secondary | ICD-10-CM | POA: Diagnosis not present

## 2017-02-15 DIAGNOSIS — J449 Chronic obstructive pulmonary disease, unspecified: Secondary | ICD-10-CM | POA: Diagnosis not present

## 2017-02-15 DIAGNOSIS — F419 Anxiety disorder, unspecified: Secondary | ICD-10-CM | POA: Diagnosis not present

## 2017-02-15 DIAGNOSIS — Z87891 Personal history of nicotine dependence: Secondary | ICD-10-CM | POA: Diagnosis not present

## 2017-02-15 DIAGNOSIS — Z7982 Long term (current) use of aspirin: Secondary | ICD-10-CM | POA: Diagnosis not present

## 2017-02-15 DIAGNOSIS — M1990 Unspecified osteoarthritis, unspecified site: Secondary | ICD-10-CM | POA: Diagnosis not present

## 2017-02-15 DIAGNOSIS — K219 Gastro-esophageal reflux disease without esophagitis: Secondary | ICD-10-CM | POA: Diagnosis not present

## 2017-02-15 DIAGNOSIS — I10 Essential (primary) hypertension: Secondary | ICD-10-CM | POA: Diagnosis not present

## 2017-02-15 DIAGNOSIS — Z7984 Long term (current) use of oral hypoglycemic drugs: Secondary | ICD-10-CM | POA: Diagnosis not present

## 2017-02-15 LAB — COMPREHENSIVE METABOLIC PANEL
ALT: 22 U/L (ref 14–54)
AST: 29 U/L (ref 15–41)
Albumin: 3.8 g/dL (ref 3.5–5.0)
Alkaline Phosphatase: 55 U/L (ref 38–126)
Anion gap: 11 (ref 5–15)
BUN: 35 mg/dL — ABNORMAL HIGH (ref 6–20)
CO2: 23 mmol/L (ref 22–32)
Calcium: 9.8 mg/dL (ref 8.9–10.3)
Chloride: 104 mmol/L (ref 101–111)
Creatinine, Ser: 1.16 mg/dL — ABNORMAL HIGH (ref 0.44–1.00)
GFR calc Af Amer: 52 mL/min — ABNORMAL LOW (ref >60)
GFR calc non Af Amer: 45 mL/min — ABNORMAL LOW (ref >60)
Glucose, Bld: 188 mg/dL — ABNORMAL HIGH (ref 65–99)
Potassium: 4 mmol/L (ref 3.5–5.1)
Sodium: 138 mmol/L (ref 135–145)
Total Bilirubin: 0.5 mg/dL (ref 0.3–1.2)
Total Protein: 8.1 g/dL (ref 6.5–8.1)

## 2017-02-15 LAB — CBC
HCT: 36.1 % (ref 35.0–47.0)
Hemoglobin: 11.9 g/dL — ABNORMAL LOW (ref 12.0–16.0)
MCH: 30.3 pg (ref 26.0–34.0)
MCHC: 32.9 g/dL (ref 32.0–36.0)
MCV: 92.2 fL (ref 80.0–100.0)
Platelets: 441 10*3/uL — ABNORMAL HIGH (ref 150–440)
RBC: 3.92 MIL/uL (ref 3.80–5.20)
RDW: 14.1 % (ref 11.5–14.5)
WBC: 12.7 10*3/uL — ABNORMAL HIGH (ref 3.6–11.0)

## 2017-02-15 LAB — TYPE AND SCREEN
ABO/RH(D): B POS
Antibody Screen: NEGATIVE

## 2017-02-15 LAB — APTT: aPTT: 26 seconds (ref 24–36)

## 2017-02-15 LAB — PROTIME-INR
INR: 0.91
Prothrombin Time: 12.2 seconds (ref 11.4–15.2)

## 2017-02-15 MED ORDER — CEFAZOLIN SODIUM-DEXTROSE 2-4 GM/100ML-% IV SOLN
2.0000 g | INTRAVENOUS | Status: AC
  Administered 2017-02-16: 2 g via INTRAVENOUS

## 2017-02-15 NOTE — Pre-Procedure Instructions (Signed)
EKG was performed today (02/15/17) and reviewed by Dr. Andree Elk (anesthesiology). Rhythm was considered to be ok to proceed with surgery tomorrow.

## 2017-02-15 NOTE — Patient Instructions (Signed)
Your procedure is scheduled on: Tuesday, February 16, 2017  Report to [facility]nd [facility] at 12[facility]30 pm  REMEMBER: Instructions that are not followed completely may result in serious medical risk up to and including death; or upon the discretion of your surgeon and anesthesiologist your surgery may need to be rescheduled.  Do not eat food or drink liquids after midnight. No gum chewing or hard candies.  You may however, drink water only up to 2 hours before you are scheduled to arrive at the hospital for your procedure.    No Alcohol for 24 hours before or after surgery.  No Smoking for 24 hours prior to surgery.  Notify your doctor if there is any change in your medical condition (cold, fever, infection).  Do not wear jewelry, make-up, hairpins, clips or nail polish.  Do not wear lotions, powders, or perfumes.   Do not shave 48 hours prior to surgery.   Contacts and dentures may not be worn into surgery.  Do not bring valuables to the hospital. [facility] is not responsible for any belongings or valuables.   TAKE THESE MEDICATIONS THE MORNING OF SURGERY WITH A SIP OF WATER:  1.  AMLODIPINE 2.  GABAPENTIN 3.  LEVOTHYROXINE 4.  METOPROLOL 5.  SYMBICORT  Use inhalers on the day of surgery and bring to the hospital.  Bring your C-PAP to the hospital with you in case you may have to spend the night.  Stop Metformin 2 days prior to surgery.  Take 1/2 of usual insulin dose the night before surgery and none on the morning of surgery. TAKE 50 LANTUS TONIGHT AND NONE IN THE MORNING.  Follow recommendations from Cardiologist, Pulmonologist or PCP regarding stopping Aspirin.  TODAY!  Stop Anti-inflammatories such as Advil, Aleve, Ibuprofen, Motrin, Naproxen, Naprosyn, Goodie powder, or aspirin products. (May take Tylenol or Acetaminophen if needed.)  TODAY!  Stop OVER THE COUNTER supplements until after surgery. (May continue Vitamin D, Vitamin B,  and multivitamin.)  If you are being discharged the day of surgery, you will not be allowed to drive home. You will need someone to drive you home and stay with you that night.   If you are taking public transportation, you will need to have a responsible adult to with you.  Please call the number above if you have any questions about these instructions.

## 2017-02-15 NOTE — Progress Notes (Signed)
Subjective:    Patient ID: Kayla [name], female    DOB: 09/30/1940, 76 y.o.   MRN: [id number] Chief Complaint  Patient presents with  . New Patient (Initial Visit)    Temporal arteritis   Presents as a new patient referred by Dr. Vanetta Shawl for right-sided temporal arterial disease. Spoke with Dr. Vanetta Shawl directly this morning and emergently placed patient on my schedule. Patient presents visibly in pain holding the right side of her head. Patient has been experiencing a "headache" located to the right side of the face for approximately 3 days. Patient states this headache is different from any other she's ever experienced. Describes this as a constant extremely painful throbbing. Over-the-counter anti-inflammatories have not helped. She denies any vision changes. Denies any blindness. Her pain radiates down the neck. The patient was given hydrocodone which is barely improving her discomfort. She was placed on prednisone today. Patient denies any blood drawn today during her visit with Dr. Vanetta Shawl. Denies any fever, nausea or vomiting.    Review of Systems  Constitutional: Negative.   HENT:       Headache  Eyes: Negative.   Respiratory: Negative.   Cardiovascular: Negative.   Gastrointestinal: Negative.   Endocrine: Negative.   Genitourinary: Negative.   Musculoskeletal: Negative.   Skin: Negative.   Allergic/Immunologic: Negative.   Neurological: Positive for headaches.  Hematological: Negative.   Psychiatric/Behavioral: Negative.       Objective:   Physical Exam  Constitutional: She is oriented to person, place, and time. She appears well-developed and well-nourished. She appears distressed (Patient visibly holding the right side of her head. Patient seems to be in pain.).  HENT:  Head: Normocephalic and atraumatic.  Eyes: Pupils are equal, round, and reactive to light. Conjunctivae are normal.  Vision and intact. No gross visual deficiencies noted on exam.  Neck: Normal range of  motion.  Cardiovascular: Normal rate, regular rhythm, normal heart sounds and intact distal pulses.  Exam reveals no gallop and no friction rub.   No murmur heard. Pulses:      Radial pulses are 2+ on the right side, and 2+ on the left side.  Pulmonary/Chest: Effort normal and breath sounds normal. No respiratory distress. She has no wheezes. She has no rales.  Abdominal: Soft. She exhibits no distension. There is no tenderness. There is no rebound and no guarding.  Musculoskeletal: Normal range of motion. She exhibits no edema.  Neurological: She is alert and oriented to person, place, and time. No cranial nerve deficit. Coordination normal.  Skin: Skin is warm and dry. She is not diaphoretic.  Psychiatric: She has a normal mood and affect. Her behavior is normal. Judgment and thought content normal.  Vitals reviewed.  BP (!) 171/71 (BP Location: Right Arm)   Pulse 73   Resp 18   Ht 5\' 6"  (1.676 m)   Wt 238 lb (108 kg)   BMI 38.41 kg/m   Past Medical History:  Diagnosis Date  . Allergy   . Anxiety   . Arthritis   . CAD (coronary artery disease)    Mild per remote cath in 2006, /    Nuclear, January, 2013, no significant abnormality,  . Carotid artery disease ([place])    Doppler, January, 2013, 0 95% RIC A., 62-13% LICA, stable  . Cataract   . Complication of anesthesia    wakes up "mean"  . COPD (chronic obstructive pulmonary disease) ([place])   . Diabetes mellitus, type 2 ([place])   . Diverticula  of colon   . Dizziness    occasional  . Ejection fraction   . GERD (gastroesophageal reflux disease)   . Goiter    hx of  . Headache(784.0)    migraine  . Hyperlipidemia   . Hypertension   . Hypothyroidism   . Left ventricular hypertrophy   . Leg edema    occasional swelling  . Obesity   . OSA (obstructive sleep apnea)    cpap setting of 12  . PONV (postoperative nausea and vomiting)    severe after every surgery  . Pulmonary hypertension, secondary   . Sleep apnea     Social History   Social History  . Marital status: Married    Spouse name: Jeneen Rinks  . Number of children: 3  . Years of education: 12   Occupational History  . retired    Social History Main Topics  . Smoking status: Former Smoker    Packs/day: 1.50    Years: 34.00    Types: Cigarettes    Quit date: 05/04/1997  . Smokeless tobacco: Never Used  . Alcohol use No  . Drug use: No  . Sexual activity: Not on file   Other Topics Concern  . Not on file   Social History Narrative   Patient lives at home with her husband Jeneen Rinks .   Retired.   Education 12 th grade.   Right handed   Caffeine two cups of coffee.   Past Surgical History:  Procedure Laterality Date  . ABDOMINAL HYSTERECTOMY     with appendectomy and colon polypectomy  . ANTERIOR CERVICAL DECOMP/DISCECTOMY FUSION N/A 11/07/2014   Procedure: Anterior Cervical diskectomy and fusion Cervical four-five, Cervical five-six, Cervical six-seven;  Surgeon: Kary Kos, MD;  Location: [facility];  Service: Neurosurgery;  Laterality: N/A;  . CHOLECYSTECTOMY N/A 04/29/2016   Procedure: LAPAROSCOPIC CHOLECYSTECTOMY WITH INTRAOPERATIVE CHOLANGIOGRAM;  Surgeon: Alphonsa Overall, MD;  Location: [facility];  Service: General;  Laterality: N/A;  . EP IMPLANTABLE DEVICE N/A 11/08/2015   Procedure: Pacemaker Implant;  Surgeon: Deboraha Sprang, MD;  Location: [facility];  Service: Cardiovascular;  Laterality: N/A;  . LUMBAR DISC SURGERY  1990's   x 2  . THYROIDECTOMY  2011  . TOTAL KNEE ARTHROPLASTY  07/27/2011   Procedure: TOTAL KNEE ARTHROPLASTY;  Surgeon: Mauri Pole, MD;  Location: [facility];  Service: Orthopedics;  Laterality: Left;  . UPPER ESOPHAGEAL ENDOSCOPIC ULTRASOUND (EUS) N/A 05/14/2016   Procedure: UPPER ESOPHAGEAL ENDOSCOPIC ULTRASOUND (EUS);  Surgeon: Milus Banister, MD;  Location: [facility];  Service: Endoscopy;  Laterality: N/A;  radial only-will be done mod if needed   Family History  Problem Relation Age of Onset  .  Heart Problems Mother   . Heart defect Mother        valve problems  . Heart Problems Father   . Heart disease Brother   . Lung cancer Daughter        non small cell   . Diabetes Maternal Aunt   . Diabetes Maternal Uncle   . Throat cancer Brother   . Colon cancer Neg Hx   . Colon polyps Neg Hx   . Esophageal cancer Neg Hx   . Rectal cancer Neg Hx   . Stomach cancer Neg Hx    No Known Allergies     Assessment & Plan:  Presents as a new patient referred by Dr. Vanetta Shawl for right-sided temporal arterial disease. Spoke with Dr. Vanetta Shawl directly this morning and emergently  placed patient on my schedule. Patient presents visibly in pain holding the right side of her head. Patient has been experiencing a "headache" located to the right side of the face for approximately 3 days. Patient states this headache is different from any other she's ever experienced. Describes this as a constant extremely painful throbbing. Over-the-counter anti-inflammatories have not helped. She denies any vision changes. Denies any blindness. Her pain radiates down the neck. The patient was given hydrocodone which is barely improving her discomfort. She was placed on prednisone today. Patient denies any blood drawn today during her visit with Dr. Vanetta Shawl. Denies any fever, nausea or vomiting.   1. Other headache syndrome - New Patient presents at the emergent request of Dr. Vanetta Shawl Patient with extremely painful right sided "headache" for over 3 days. Patient denies any visual disturbances or blindness. Patient has not had any labs drawn at this point. Patient states headache is worse than she's ever experienced before narcotics are barely improving her discomfort We discussed undergoing a right temporal artery biopsy as an important piece needed for an appropriate diagnosis I used a diagram and explained exactly what the procedure consisted of. Her husband was present Procedure, risks and benefits explained to the  patient. All questions answered Patient wishes to proceed She was placed on Dr. Delana Meyer scheduled for tomorrow  2. Diabetes mellitus due to underlying condition with diabetic nephropathy, unspecified whether long term insulin use ([place]) - Stable Encouraged good control as its slows the progression of atherosclerotic disease  3. Pure hypercholesterolemia - Stable Encouraged good control as its slows the progression of atherosclerotic disease  Current Outpatient Prescriptions on File Prior to Visit  Medication Sig Dispense Refill  . acetaminophen (TYLENOL) 500 MG tablet Take 1,000 mg by mouth every 8 (eight) hours as needed for mild pain or moderate pain.    Marland Kitchen albuterol (PROAIR HFA) 108 (90 Base) MCG/ACT inhaler Inhale 2 puffs into the lungs every 6 (six) hours as needed for wheezing or shortness of breath. 3 Inhaler 3  . albuterol (PROVENTIL) (2.5 MG/3ML) 0.083% nebulizer solution Take 2.5 mg by nebulization every 6 (six) hours as needed for wheezing or shortness of breath.    Marland Kitchen amLODipine (NORVASC) 5 MG tablet Take 5 mg by mouth daily.     Marland Kitchen aspirin 81 MG tablet Take 81 mg by mouth daily.    . Cholecalciferol (D3-1000 PO) Take 1,000 Units by mouth daily.     Marland Kitchen doxycycline (VIBRA-TABS) 100 MG tablet Take 1 tablet (100 mg total) by mouth 2 (two) times daily. 14 tablet 0  . fluticasone (FLONASE) 50 MCG/ACT nasal spray USE 2 SPRAYS NASALLY DAILY (Patient taking differently: Place 2 sprays into both nostrils 2 (two) times daily. ) 48 g 3  . gabapentin (NEURONTIN) 600 MG tablet Take 600 mg by mouth 3 (three) times daily.     Marland Kitchen HYDROcodone-acetaminophen (NORCO/VICODIN) 5-325 MG tablet Take 1-2 tablets by mouth every 6 (six) hours as needed for moderate pain. (Patient taking differently: Take 1-2 tablets by mouth every 4 (four) hours as needed for moderate pain. ) 30 tablet 0  . insulin glargine (LANTUS) 100 UNIT/ML injection Inject 100 Units into the skin 2 (two) times daily.     Marland Kitchen levothyroxine  (SYNTHROID, LEVOTHROID) 175 MCG tablet Take 175 mcg by mouth daily.    Marland Kitchen LORazepam (ATIVAN) 2 MG tablet Take 2 mg by mouth daily as needed for anxiety. FOR DEPRESSION    . losartan-hydrochlorothiazide (HYZAAR) 100-25 MG tablet Take 1  tablet by mouth daily.    . metFORMIN (GLUCOPHAGE) 500 MG tablet Take 1,000 mg by mouth 2 (two) times daily with a meal.    . metoprolol tartrate (LOPRESSOR) 25 MG tablet Take 1 tablet (25 mg total) by mouth 2 (two) times daily. 180 tablet 3  . nitroGLYCERIN (NITROSTAT) 0.4 MG SL tablet Place 1 tablet (0.4 mg total) under the tongue every 5 (five) minutes as needed for chest pain. 25 tablet 3  . omeprazole (PRILOSEC) 20 MG capsule Take 1 capsule (20 mg total) by mouth daily. 28 capsule 0  . ondansetron (ZOFRAN-ODT) 4 MG disintegrating tablet Take 1 tablet (4 mg total) by mouth every 6 (six) hours as needed for nausea. 20 tablet 0  . PHENADOZ 25 MG suppository Place 25 mg rectally daily as needed (headaches).     . simvastatin (ZOCOR) 20 MG tablet Take 20 mg by mouth every evening.     . SYMBICORT 160-4.5 MCG/ACT inhaler INHALE 2 PUFFS INTO THE LUNGS 2 TIMES DAILY. 3 Inhaler 1  . vitamin B-12 (CYANOCOBALAMIN) 1000 MCG tablet Take 1,000 mcg by mouth daily.     Current Facility-Administered Medications on File Prior to Visit  Medication Dose Route Frequency Provider Last Rate Last Dose  . 0.9 %  sodium chloride infusion  500 mL Intravenous Continuous Milus Banister, MD       There are no Patient Instructions on file for this visit. No Follow-up on file.  Azelea Seguin A Beulah Capobianco, PA-C

## 2017-02-16 ENCOUNTER — Ambulatory Visit: Payer: Medicare HMO | Admitting: Certified Registered Nurse Anesthetist

## 2017-02-16 ENCOUNTER — Ambulatory Visit (INDEPENDENT_AMBULATORY_CARE_PROVIDER_SITE_OTHER): Payer: Medicare HMO | Admitting: Vascular Surgery

## 2017-02-16 ENCOUNTER — Encounter
Admission: RE | Disposition: A | Discharge status: Home / Self Care | Payer: Self-pay | Source: Ambulatory Visit | Attending: Vascular Surgery

## 2017-02-16 ENCOUNTER — Ambulatory Visit
Admission: RE | Admit: 2017-02-16 | Discharge: 2017-02-16 | Disposition: A | Discharge status: Home / Self Care | Payer: Medicare HMO | Source: Ambulatory Visit | Attending: Vascular Surgery | Admitting: Vascular Surgery

## 2017-02-16 DIAGNOSIS — E1151 Type 2 diabetes mellitus with diabetic peripheral angiopathy without gangrene: Secondary | ICD-10-CM | POA: Diagnosis not present

## 2017-02-16 DIAGNOSIS — J449 Chronic obstructive pulmonary disease, unspecified: Secondary | ICD-10-CM | POA: Insufficient documentation

## 2017-02-16 DIAGNOSIS — E039 Hypothyroidism, unspecified: Secondary | ICD-10-CM | POA: Insufficient documentation

## 2017-02-16 DIAGNOSIS — F419 Anxiety disorder, unspecified: Secondary | ICD-10-CM | POA: Insufficient documentation

## 2017-02-16 DIAGNOSIS — Z87891 Personal history of nicotine dependence: Secondary | ICD-10-CM | POA: Insufficient documentation

## 2017-02-16 DIAGNOSIS — Z7982 Long term (current) use of aspirin: Secondary | ICD-10-CM | POA: Insufficient documentation

## 2017-02-16 DIAGNOSIS — M1990 Unspecified osteoarthritis, unspecified site: Secondary | ICD-10-CM | POA: Diagnosis not present

## 2017-02-16 DIAGNOSIS — E669 Obesity, unspecified: Secondary | ICD-10-CM | POA: Insufficient documentation

## 2017-02-16 DIAGNOSIS — Z79899 Other long term (current) drug therapy: Secondary | ICD-10-CM | POA: Insufficient documentation

## 2017-02-16 DIAGNOSIS — K219 Gastro-esophageal reflux disease without esophagitis: Secondary | ICD-10-CM | POA: Insufficient documentation

## 2017-02-16 DIAGNOSIS — I2510 Atherosclerotic heart disease of native coronary artery without angina pectoris: Secondary | ICD-10-CM | POA: Diagnosis not present

## 2017-02-16 DIAGNOSIS — I10 Essential (primary) hypertension: Secondary | ICD-10-CM | POA: Diagnosis not present

## 2017-02-16 DIAGNOSIS — Z6838 Body mass index (BMI) 38.0-38.9, adult: Secondary | ICD-10-CM | POA: Insufficient documentation

## 2017-02-16 DIAGNOSIS — E109 Type 1 diabetes mellitus without complications: Secondary | ICD-10-CM | POA: Diagnosis not present

## 2017-02-16 DIAGNOSIS — M316 Other giant cell arteritis: Secondary | ICD-10-CM | POA: Diagnosis not present

## 2017-02-16 DIAGNOSIS — Z7984 Long term (current) use of oral hypoglycemic drugs: Secondary | ICD-10-CM | POA: Insufficient documentation

## 2017-02-16 DIAGNOSIS — G4730 Sleep apnea, unspecified: Secondary | ICD-10-CM | POA: Diagnosis not present

## 2017-02-16 DIAGNOSIS — Z01810 Encounter for preprocedural cardiovascular examination: Secondary | ICD-10-CM | POA: Diagnosis not present

## 2017-02-16 DIAGNOSIS — R51 Headache: Secondary | ICD-10-CM | POA: Insufficient documentation

## 2017-02-16 HISTORY — PX: ARTERY BIOPSY: SHX891

## 2017-02-16 LAB — GLUCOSE, CAPILLARY
Glucose-Capillary: 102 mg/dL — ABNORMAL HIGH (ref 65–99)
Glucose-Capillary: 86 mg/dL (ref 65–99)

## 2017-02-16 LAB — ABO/RH: ABO/RH(D): B POS

## 2017-02-16 SURGERY — BIOPSY TEMPORAL ARTERY
Anesthesia: Monitor Anesthesia Care | Laterality: Right | Wound class: Clean

## 2017-02-16 MED ORDER — SODIUM CHLORIDE 0.9 % IV SOLN
INTRAVENOUS | Status: DC
  Administered 2017-02-16: 13:00:00 via INTRAVENOUS

## 2017-02-16 MED ORDER — SCOPOLAMINE 1 MG/3DAYS TD PT72
1.0000 | MEDICATED_PATCH | TRANSDERMAL | Status: DC
  Administered 2017-02-16: 1.5 mg via TRANSDERMAL

## 2017-02-16 MED ORDER — FENTANYL CITRATE (PF) 100 MCG/2ML IJ SOLN
INTRAMUSCULAR | Status: AC
  Filled 2017-02-16: qty 2

## 2017-02-16 MED ORDER — LIDOCAINE-EPINEPHRINE 1 %-1:100000 IJ SOLN
INTRAMUSCULAR | Status: AC
  Filled 2017-02-16: qty 1

## 2017-02-16 MED ORDER — ONDANSETRON HCL 4 MG/2ML IJ SOLN
INTRAMUSCULAR | Status: DC | PRN
  Administered 2017-02-16: 4 mg via INTRAVENOUS

## 2017-02-16 MED ORDER — FENTANYL CITRATE (PF) 100 MCG/2ML IJ SOLN
25.0000 ug | INTRAMUSCULAR | Status: DC | PRN
  Administered 2017-02-16: 25 ug via INTRAVENOUS

## 2017-02-16 MED ORDER — LIDOCAINE-EPINEPHRINE 1 %-1:100000 IJ SOLN
INTRAMUSCULAR | Status: DC | PRN
  Administered 2017-02-16: 10 mL

## 2017-02-16 MED ORDER — PROPOFOL 10 MG/ML IV BOLUS
INTRAVENOUS | Status: DC | PRN
  Administered 2017-02-16 (×2): 10 mg via INTRAVENOUS

## 2017-02-16 MED ORDER — BUPIVACAINE HCL (PF) 0.5 % IJ SOLN
INTRAMUSCULAR | Status: DC | PRN
  Administered 2017-02-16: 6 mL

## 2017-02-16 MED ORDER — CHLORHEXIDINE GLUCONATE CLOTH 2 % EX PADS: 6.0000 | MEDICATED_PAD | Freq: Once | CUTANEOUS | Status: DC

## 2017-02-16 MED ORDER — ONDANSETRON HCL 4 MG/2ML IJ SOLN: 4.0000 mg | Freq: Once | INTRAMUSCULAR | Status: DC | PRN

## 2017-02-16 MED ORDER — CEFAZOLIN SODIUM-DEXTROSE 2-4 GM/100ML-% IV SOLN
INTRAVENOUS | Status: AC
  Filled 2017-02-16: qty 100

## 2017-02-16 MED ORDER — SCOPOLAMINE 1 MG/3DAYS TD PT72
MEDICATED_PATCH | TRANSDERMAL | Status: DC
Start: 2017-02-16 — End: 2017-02-16
  Administered 2017-02-16: 1.5 mg via TRANSDERMAL
  Filled 2017-02-16: qty 1

## 2017-02-16 MED ORDER — FENTANYL CITRATE (PF) 100 MCG/2ML IJ SOLN
INTRAMUSCULAR | Status: DC | PRN
  Administered 2017-02-16 (×2): 25 ug via INTRAVENOUS
  Administered 2017-02-16: 50 ug via INTRAVENOUS

## 2017-02-16 MED ORDER — FAMOTIDINE 20 MG PO TABS
ORAL_TABLET | ORAL | Status: AC
  Administered 2017-02-16: 20 mg via ORAL
  Filled 2017-02-16: qty 1

## 2017-02-16 MED ORDER — BUPIVACAINE HCL (PF) 0.5 % IJ SOLN
INTRAMUSCULAR | Status: AC
  Filled 2017-02-16: qty 30

## 2017-02-16 MED ORDER — LIDOCAINE HCL (CARDIAC) 20 MG/ML IV SOLN
INTRAVENOUS | Status: DC | PRN
  Administered 2017-02-16: 40 mg via INTRAVENOUS

## 2017-02-16 MED ORDER — PROPOFOL 500 MG/50ML IV EMUL
INTRAVENOUS | Status: DC | PRN
  Administered 2017-02-16: 50 ug/kg/min via INTRAVENOUS

## 2017-02-16 MED ORDER — FAMOTIDINE 20 MG PO TABS
20.0000 mg | ORAL_TABLET | Freq: Once | ORAL | Status: AC
  Administered 2017-02-16: 20 mg via ORAL

## 2017-02-16 MED ORDER — PROPOFOL 500 MG/50ML IV EMUL
INTRAVENOUS | Status: AC
  Filled 2017-02-16: qty 50

## 2017-02-16 SURGICAL SUPPLY — 39 items
BLADE CLIPPER SURG (BLADE) ×2 IMPLANT
BLADE SURG 15 STRL LF DISP TIS (BLADE) ×1 IMPLANT
BLADE SURG 15 STRL SS (BLADE) ×1
CNTNR SPEC 2.5X3XGRAD LEK (MISCELLANEOUS)
CONT SPEC 4OZ STER OR WHT (MISCELLANEOUS)
CONTAINER SPEC 2.5X3XGRAD LEK (MISCELLANEOUS) IMPLANT
COTTON BALL STRL MEDIUM (GAUZE/BANDAGES/DRESSINGS) ×2 IMPLANT
DERMABOND ADVANCED (GAUZE/BANDAGES/DRESSINGS) ×1
DERMABOND ADVANCED .7 DNX12 (GAUZE/BANDAGES/DRESSINGS) ×1 IMPLANT
DRAPE LAPAROTOMY 77X122 PED (DRAPES) ×2 IMPLANT
DRSG TELFA 4X3 1S NADH ST (GAUZE/BANDAGES/DRESSINGS) ×2 IMPLANT
ELECT CAUTERY BLADE 6.4 (BLADE) ×2 IMPLANT
ELECT REM PT RETURN 9FT ADLT (ELECTROSURGICAL) ×2
ELECTRODE REM PT RTRN 9FT ADLT (ELECTROSURGICAL) ×1 IMPLANT
GLOVE BIO SURGEON STRL SZ7 (GLOVE) ×2 IMPLANT
GLOVE BIOGEL PI IND STRL 6.5 (GLOVE) ×1 IMPLANT
GLOVE BIOGEL PI INDICATOR 6.5 (GLOVE) ×1
GLOVE INDICATOR 7.5 STRL GRN (GLOVE) ×2 IMPLANT
GLOVE SURG SYN 8.0 (GLOVE) ×2 IMPLANT
GOWN L4 XLG 20 PK N/S (GOWN DISPOSABLE) ×2 IMPLANT
GOWN STRL REUS W/ TWL LRG LVL3 (GOWN DISPOSABLE) ×2 IMPLANT
GOWN STRL REUS W/TWL LRG LVL3 (GOWN DISPOSABLE) ×2
LABEL OR SOLS (LABEL) ×2 IMPLANT
NEEDLE HYPO 25X1 1.5 SAFETY (NEEDLE) ×4 IMPLANT
NS IRRIG 500ML POUR BTL (IV SOLUTION) ×2 IMPLANT
PACK BASIN MINOR ARMC (MISCELLANEOUS) ×2 IMPLANT
SOL PREP PVP 2OZ (MISCELLANEOUS) ×2
SOLUTION PREP PVP 2OZ (MISCELLANEOUS) ×1 IMPLANT
SUCTION FRAZIER HANDLE 10FR (MISCELLANEOUS) ×1
SUCTION TUBE FRAZIER 10FR DISP (MISCELLANEOUS) ×1 IMPLANT
SUT MNCRL AB 4-0 PS2 18 (SUTURE) ×2 IMPLANT
SUT SILK 3 0 (SUTURE) ×1
SUT SILK 3-0 18XBRD TIE 12 (SUTURE) ×1 IMPLANT
SUT SILK 4 0 (SUTURE) ×1
SUT SILK 4-0 18XBRD TIE 12 (SUTURE) ×1 IMPLANT
SUT VIC AB 3-0 SH 27 (SUTURE) ×1
SUT VIC AB 3-0 SH 27X BRD (SUTURE) ×1 IMPLANT
SYR BULB IRRIG 60ML STRL (SYRINGE) ×2 IMPLANT
SYRINGE 10CC LL (SYRINGE) ×4 IMPLANT

## 2017-02-16 NOTE — Transfer of Care (Signed)
Immediate Anesthesia Transfer of Care Note  Patient: Kayla [name]  Procedure(s) Performed: BIOPSY TEMPORAL ARTERY (Right )  Patient Location: PACU  Anesthesia Type:General  Level of Consciousness: awake, alert  and oriented  Airway & Oxygen Therapy: Patient Spontanous Breathing and Patient connected to face mask oxygen  Post-op Assessment: Report given to RN and Post -op Vital signs reviewed and stable  Post vital signs: Reviewed and stable  Last Vitals:  Vitals:   02/16/17 1245  BP: (!) 118/58  Pulse: 64  Resp: 17  Temp: 36.5 C  SpO2: 96%    Last Pain:  Vitals:   02/16/17 1245  TempSrc: Oral  PainSc: 7          Complications: No apparent anesthesia complications

## 2017-02-16 NOTE — Anesthesia Postprocedure Evaluation (Signed)
Anesthesia Post Note  Patient: Kayla [name]  Procedure(s) Performed: BIOPSY TEMPORAL ARTERY (Right )  Patient location during evaluation: PACU Anesthesia Type: MAC Level of consciousness: awake and alert and oriented Pain management: pain level controlled Vital Signs Assessment: post-procedure vital signs reviewed and stable Respiratory status: spontaneous breathing, nonlabored ventilation and respiratory function stable Cardiovascular status: blood pressure returned to baseline and stable Postop Assessment: no signs of nausea or vomiting Anesthetic complications: no     Last Vitals:  Vitals:   02/16/17 1625 02/16/17 1640  BP: (!) 130/55   Pulse: (!) 59 (!) 59  Resp: 15 14  Temp: 36.4 C   SpO2: 94% 96%    Last Pain:  Vitals:   02/16/17 1640  TempSrc:   PainSc: 2                  Zenobia Kuennen

## 2017-02-16 NOTE — Anesthesia Preprocedure Evaluation (Addendum)
Anesthesia Evaluation  Patient identified by MRN, date of birth, ID band Patient awake    Reviewed: Allergy & Precautions, NPO status , Patient's Chart, lab work & pertinent test results  History of Anesthesia Complications (+) PONV  Airway Mallampati: III  TM Distance: >3 FB     Dental  (+) Upper Dentures   Pulmonary sleep apnea , COPD, former smoker,     + decreased breath sounds      Cardiovascular Exercise Tolerance: Poor hypertension, Pt. on medications and Pt. on home beta blockers + CAD and + Peripheral Vascular Disease   Rhythm:Regular Rate:Normal     Neuro/Psych  Headaches, Anxiety    GI/Hepatic Neg liver ROS, GERD  Medicated,  Endo/Other  diabetes, Type 1Hypothyroidism   Renal/GU negative Renal ROS     Musculoskeletal  (+) Arthritis ,   Abdominal (+) + obese,   Peds negative pediatric ROS (+)  Hematology   Anesthesia Other Findings   Reproductive/Obstetrics                            Anesthesia Physical Anesthesia Plan  ASA: III  Anesthesia Plan: MAC   Post-op Pain Management:    Induction: Intravenous  PONV Risk Score and Plan:   Airway Management Planned: Natural Airway and Nasal Cannula  Additional Equipment:   Intra-op Plan:   Post-operative Plan:   Informed Consent: I have reviewed the patients History and Physical, chart, labs and discussed the procedure including the risks, benefits and alternatives for the proposed anesthesia with the patient or authorized representative who has indicated his/her understanding and acceptance.     Plan Discussed with: CRNA  Anesthesia Plan Comments:         Anesthesia Quick Evaluation

## 2017-02-16 NOTE — Op Note (Signed)
SURGICAL OPERATIVE REPORT  DATE OF PROCEDURE: 02/16/2017  ATTENDING Surgeon(s): Schnier, Dolores Lory, MD  ANESTHESIA: Local infiltration with both 1% lidocaine with epinephrine and quarter percent Marcaine without and with  IV sedation  PRE-OPERATIVE DIAGNOSIS: Right-sided headache with clinical suspicion for temporal arteritis (icd-10's: M31.6)  POST-OPERATIVE DIAGNOSIS: Same  PROCEDURE(S):  1.) Right temporal artery ligation and excisional biopsy (cpt: 84665)  INTRAOPERATIVE FINDINGS: Supple artery without nodules or fibrotic changes  ESTIMATED BLOOD LOSS: Minimal (<20 mL)   SPECIMENS: 3 cm segment of right temporal artery  IMPLANTS: None  DRAINS: None  COMPLICATIONS: None apparent  CONDITION AT END OF PROCEDURE: Hemodynamically stable and awake and conversing in the recovery room  DISPOSITION OF PATIENT: PACU/Recovery  INDICATIONS FOR PROCEDURE:  Patient is a 76 y.o. female who presented with right-sided headache.  All risks, benefits, and alternatives to above elective procedures were discussed with the patient, who elected to proceed, and informed consent was accordingly obtained at that time.   DETAILS OF PROCEDURE: Patient was brought to the operating suite and appropriately identified.  IV sedation was administered by anesthetist along with appropriate pre-operative antibiotics. In supine position, operative site was prepped and draped in the usual sterile fashion, and following a brief time out, local anesthetic without epinephrine was injected into the subcutaneous tissue over the palpable temporal pulse.  A approximately 3 cm linear incision was then made and extended deep through superficial subcutaneous tissue using sharp/blunt dissection and minimal electrocautery. Additional local anesthetic was then injected as needed, and the thin superficial fascia overlying the visualized temporal artery was sharply divided with scissors. Proximal and distal ends of the  isolated temporal artery segment were dissected free of surrounding tissue and ligated using 3-0 silk suture, small arterial branches were identified and ligated, and the temporal artery was divided on the specimen side at each end of the isolated segment. Specimen was then removed and handed off of the field for pathology.  Hemostasis was confirmed, and skin was then re-approximated using running 3-0 Vicryl suture for the subcutaneous tissues and a running 4-0 Monocryl subcuticular suture to reapproximate the skin.  Prior to closure additional local anesthetic with epinephrine was injected. Skin was then cleaned and dried, and sterile Dermabond was applied.   the patient tolerated the procedure well without incident there were no immediate complications.

## 2017-02-16 NOTE — OR Nursing (Signed)
Per Dr Micah Flesher will review chart history in regards to pacemaker and cardiac clearance.

## 2017-02-16 NOTE — Discharge Instructions (Signed)

## 2017-02-16 NOTE — Anesthesia Post-op Follow-up Note (Signed)
Anesthesia QCDR form completed.        

## 2017-02-16 NOTE — H&P (Signed)
[facility] History & Physical Update  The patient was interviewed and re-examined.  The patient's previous History and Physical has been reviewed and is unchanged.  There is no change in the plan of care. We plan to proceed with the scheduled procedure.  [name], MD  02/16/2017, 1:41 PM

## 2017-02-17 ENCOUNTER — Encounter: Payer: Self-pay | Admitting: Vascular Surgery

## 2017-02-18 LAB — SURGICAL PATHOLOGY

## 2017-02-22 ENCOUNTER — Ambulatory Visit (INDEPENDENT_AMBULATORY_CARE_PROVIDER_SITE_OTHER): Payer: Medicare HMO | Admitting: Vascular Surgery

## 2017-02-22 ENCOUNTER — Encounter (INDEPENDENT_AMBULATORY_CARE_PROVIDER_SITE_OTHER): Payer: Self-pay | Admitting: Vascular Surgery

## 2017-02-22 VITALS — BP 142/75 | HR 77 | Resp 16 | Ht 66.0 in | Wt 240.0 lb

## 2017-02-22 DIAGNOSIS — E1149 Type 2 diabetes mellitus with other diabetic neurological complication: Secondary | ICD-10-CM | POA: Diagnosis not present

## 2017-02-22 DIAGNOSIS — Z6838 Body mass index (BMI) 38.0-38.9, adult: Secondary | ICD-10-CM | POA: Diagnosis not present

## 2017-02-22 DIAGNOSIS — G4489 Other headache syndrome: Secondary | ICD-10-CM

## 2017-02-22 DIAGNOSIS — I10 Essential (primary) hypertension: Secondary | ICD-10-CM

## 2017-02-22 DIAGNOSIS — R51 Headache: Secondary | ICD-10-CM | POA: Diagnosis not present

## 2017-02-22 NOTE — Progress Notes (Signed)
Patient ID: Kayla [name], female   DOB: 01/17/1941, 76 y.o.   MRN: [id number]  Chief Complaint  Patient presents with  . Follow-up    [facility] 1 week    HPI Kayla [name] is a 76 y.o. female.  Head ache is much better Continues on a Prednisone taper.  No incisional pain   Past Medical History:  Diagnosis Date  . Allergy   . Anxiety   . Arthritis   . CAD (coronary artery disease)    Mild per remote cath in 2006, /    Nuclear, January, 2013, no significant abnormality,  . Carotid artery disease ([place])    Doppler, January, 2013, 0 53% RIC A., 61-44% LICA, stable  . Cataract   . Complication of anesthesia    wakes up "mean"  . COPD (chronic obstructive pulmonary disease) ([place])   . Diabetes mellitus, type 2 ([place])   . Diverticula of colon   . Dizziness    occasional  . Ejection fraction   . GERD (gastroesophageal reflux disease)   . Goiter    hx of  . Headache(784.0)    migraine  . Hyperlipidemia   . Hypertension   . Hypothyroidism   . Left ventricular hypertrophy   . Leg edema    occasional swelling  . Obesity   . OSA (obstructive sleep apnea)    cpap setting of 12  . PONV (postoperative nausea and vomiting)    severe after every surgery  . Pulmonary hypertension, secondary   . Sleep apnea     Past Surgical History:  Procedure Laterality Date  . ABDOMINAL HYSTERECTOMY     with appendectomy and colon polypectomy  . ANTERIOR CERVICAL DECOMP/DISCECTOMY FUSION N/A 11/07/2014   Procedure: Anterior Cervical diskectomy and fusion Cervical four-five, Cervical five-six, Cervical six-seven;  Surgeon: Kary Kos, MD;  Location: [facility];  Service: Neurosurgery;  Laterality: N/A;  . APPENDECTOMY    . ARTERY BIOPSY Right 02/16/2017   Procedure: BIOPSY TEMPORAL ARTERY;  Surgeon: Katha Cabal, MD;  Location: [facility] OR[facility];  Service: Vascular;  Laterality: Right;  . BACK SURGERY    . CARDIAC CATHETERIZATION    . CATARACT EXTRACTION W/ INTRAOCULAR LENS  IMPLANT,  BILATERAL Bilateral   . CHOLECYSTECTOMY N/A 04/29/2016   Procedure: LAPAROSCOPIC CHOLECYSTECTOMY WITH INTRAOPERATIVE CHOLANGIOGRAM;  Surgeon: Alphonsa Overall, MD;  Location: [facility];  Service: General;  Laterality: N/A;  . EP IMPLANTABLE DEVICE N/A 11/08/2015   Procedure: Pacemaker Implant;  Surgeon: Deboraha Sprang, MD;  Location: [place] [facility];  Service: Cardiovascular;  Laterality: N/A;  . INSERT / REPLACE / REMOVE PACEMAKER  2017   Dr. Jens Som [facility])  . JOINT REPLACEMENT    . LUMBAR DISC SURGERY  1990's   x 2  . THYROIDECTOMY  2011  . TOTAL KNEE ARTHROPLASTY  07/27/2011   Procedure: TOTAL KNEE ARTHROPLASTY;  Surgeon: Mauri Pole, MD;  Location: [facility];  Service: Orthopedics;  Laterality: Left;  . UPPER ESOPHAGEAL ENDOSCOPIC ULTRASOUND (EUS) N/A 05/14/2016   Procedure: UPPER ESOPHAGEAL ENDOSCOPIC ULTRASOUND (EUS);  Surgeon: Milus Banister, MD;  Location: [facility];  Service: Endoscopy;  Laterality: N/A;  radial only-will be done mod if needed      No Known Allergies  Current Outpatient Prescriptions  Medication Sig Dispense Refill  . acetaminophen (TYLENOL) 500 MG tablet Take 1,000 mg by mouth every 8 (eight) hours as needed for mild pain or moderate pain.    Marland Kitchen albuterol (PROAIR HFA) 108 (90  Base) MCG/ACT inhaler Inhale 2 puffs into the lungs every 6 (six) hours as needed for wheezing or shortness of breath. 3 Inhaler 3  . albuterol (PROVENTIL) (2.5 MG/3ML) 0.083% nebulizer solution Take 2.5 mg by nebulization every 6 (six) hours as needed for wheezing or shortness of breath.    Marland Kitchen amLODipine (NORVASC) 5 MG tablet Take 5 mg by mouth daily.     Marland Kitchen amoxicillin-clavulanate (AUGMENTIN) 875-125 MG tablet Take 1 tablet by mouth 2 (two) times daily.  0  . aspirin 81 MG tablet Take 81 mg by mouth daily.    . Cholecalciferol (D3-1000 PO) Take 1,000 Units by mouth daily.     . ferrous sulfate 325 (65 FE) MG tablet Take 325 mg by mouth 2 (two) times daily.  3  . fluticasone (FLONASE) 50  MCG/ACT nasal spray USE 2 SPRAYS NASALLY DAILY (Patient taking differently: Place 2 sprays into both nostrils 2 (two) times daily. ) 48 g 3  . furosemide (LASIX) 20 MG tablet 20 mg daily.     Marland Kitchen gabapentin (NEURONTIN) 600 MG tablet Take 600 mg by mouth 3 (three) times daily.     Marland Kitchen HYDROcodone-acetaminophen (NORCO/VICODIN) 5-325 MG tablet Take 1-2 tablets by mouth every 6 (six) hours as needed for moderate pain. (Patient taking differently: Take 1-2 tablets by mouth every 4 (four) hours as needed for moderate pain. ) 30 tablet 0  . insulin glargine (LANTUS) 100 UNIT/ML injection Inject 100 Units into the skin 2 (two) times daily.     Marland Kitchen levothyroxine (SYNTHROID, LEVOTHROID) 200 MCG tablet Take 200 mcg by mouth daily before breakfast.    . LORazepam (ATIVAN) 2 MG tablet Take 2 mg by mouth daily as needed for anxiety. FOR DEPRESSION    . losartan-hydrochlorothiazide (HYZAAR) 100-25 MG tablet Take 1 tablet by mouth daily.    . metFORMIN (GLUCOPHAGE) 500 MG tablet Take 1,000 mg by mouth 2 (two) times daily with a meal.    . metoprolol tartrate (LOPRESSOR) 25 MG tablet Take 1 tablet (25 mg total) by mouth 2 (two) times daily. 180 tablet 3  . NEOMYCIN-POLYMYXIN-HYDROCORTISONE (CORTISPORIN) 1 % SOLN OTIC solution PLACE 4 DROPS INTO EAR 3-4 TIMES DAILY. LIE WITH AFFECTED EAR UPWARD  0  . nitroGLYCERIN (NITROSTAT) 0.4 MG SL tablet Place 1 tablet (0.4 mg total) under the tongue every 5 (five) minutes as needed for chest pain. 25 tablet 3  . ondansetron (ZOFRAN-ODT) 4 MG disintegrating tablet Take 1 tablet (4 mg total) by mouth every 6 (six) hours as needed for nausea. 20 tablet 0  . PHENADOZ 25 MG suppository Place 25 mg rectally daily as needed (headaches).     . predniSONE (DELTASONE) 10 MG tablet 30 mg daily.     . simvastatin (ZOCOR) 20 MG tablet Take 20 mg by mouth every evening.     . SYMBICORT 160-4.5 MCG/ACT inhaler INHALE 2 PUFFS INTO THE LUNGS 2 TIMES DAILY. 3 Inhaler 1  . vitamin B-12 (CYANOCOBALAMIN)  1000 MCG tablet Take 1,000 mcg by mouth daily.     Current Facility-Administered Medications  Medication Dose Route Frequency Provider Last Rate Last Dose  . 0.9 %  sodium chloride infusion  500 mL Intravenous Continuous Milus Banister, MD            Physical Exam BP (!) 142/75 (BP Location: Right Arm)   Pulse 77   Resp 16   Ht 5\' 6"  (1.676 m)   Wt 240 lb (108.9 kg)   BMI 38.74 kg/m  Gen:  WD/WN, NAD Skin: incision C/D/I     Assessment/Plan: 1. Other headache syndrome Doing well head ache improving biopsy is Negative  Follow up PRN  2. Essential hypertension Continue antihypertensive medications as already ordered, these medications have been reviewed and there are no changes at this time.        [name] 02/22/2017, 2:40 PM   This note was created with Dragon medical transcription system.  Any errors from dictation are unintentional.

## 2017-02-26 ENCOUNTER — Ambulatory Visit (INDEPENDENT_AMBULATORY_CARE_PROVIDER_SITE_OTHER)
Admission: RE | Admit: 2017-02-26 | Discharge: 2017-02-26 | Disposition: A | Discharge status: Home / Self Care | Payer: Medicare HMO | Source: Ambulatory Visit | Attending: Emergency Medicine | Admitting: Emergency Medicine

## 2017-02-26 ENCOUNTER — Encounter: Payer: Self-pay | Admitting: Emergency Medicine

## 2017-02-26 ENCOUNTER — Ambulatory Visit (INDEPENDENT_AMBULATORY_CARE_PROVIDER_SITE_OTHER): Payer: Medicare HMO | Admitting: Emergency Medicine

## 2017-02-26 VITALS — BP 136/62 | HR 88 | Ht 66.0 in | Wt 245.2 lb

## 2017-02-26 DIAGNOSIS — R0602 Shortness of breath: Secondary | ICD-10-CM | POA: Diagnosis not present

## 2017-02-26 DIAGNOSIS — R05 Cough: Secondary | ICD-10-CM | POA: Diagnosis not present

## 2017-02-26 DIAGNOSIS — G4733 Obstructive sleep apnea (adult) (pediatric): Secondary | ICD-10-CM | POA: Diagnosis not present

## 2017-02-26 MED ORDER — PREDNISONE 10 MG PO TABS: ORAL_TABLET | ORAL | 0 refills | Status: DC

## 2017-02-26 MED ORDER — DOXYCYCLINE HYCLATE 100 MG PO TABS: 100.0000 mg | ORAL_TABLET | Freq: Two times a day (BID) | ORAL | 0 refills | Status: DC

## 2017-02-26 MED ORDER — FLUTICASONE PROPIONATE 50 MCG/ACT NA SUSP: NASAL | 1 refills | Status: DC

## 2017-02-26 MED ORDER — BUDESONIDE-FORMOTEROL FUMARATE 160-4.5 MCG/ACT IN AERO: INHALATION_SPRAY | RESPIRATORY_TRACT | 1 refills | Status: DC

## 2017-02-26 MED ORDER — ALBUTEROL SULFATE HFA 108 (90 BASE) MCG/ACT IN AERS: 2.0000 | INHALATION_SPRAY | Freq: Four times a day (QID) | RESPIRATORY_TRACT | 1 refills | Status: DC | PRN

## 2017-02-26 NOTE — Assessment & Plan Note (Signed)
Currently on AutoSet CPAP, appears to be tolerating

## 2017-02-26 NOTE — Assessment & Plan Note (Signed)
With an apparent acute exacerbation although difficult to say whether this is subacute or slow progression of the underlying disease.  Very distant breath sounds but no overt wheezing.  She did not respond to recent prednisone burst.  I believe we need to rule out pneumonia, treat her for possible pneumonia versus bronchitis.  Repeat her prednisone taper to see if she benefits in conjunction with antibiotic.  If she does not respond to then possibly consider other etiologies for dyspnea and chest discomfort, possibly revisit cardiac eval

## 2017-02-26 NOTE — Progress Notes (Signed)
Subjective:    Patient ID: Kayla [name], female    DOB: 1940-08-17, 76 y.o.   MRN: [id number]  HPI 76 year old (50+ pack years) with a history of COPD, allergic rhinitis, GERD, OSA on CPAP on auto-set mode, hypertension.  Followed by Dr. Halford Chessman in our office.  She is here today for an acute visit, complaining of dyspnea, congestion that is most bothersome at night or with any exertion, started about 10 days ago. She is having some mid chest pain with exertion. She took a prednisone taper, had refills of this from Dr Halford Chessman, just finished 2 days ago. No overt fevers. Her mucous is yellow. She is on Symbicort, she is using albuterol nebs about tid currently. She is running out of flonase, still uses it.    Review of Systems  Past Medical History:  Diagnosis Date  . Allergy   . Anxiety   . Arthritis   . CAD (coronary artery disease)    Mild per remote cath in 2006, /    Nuclear, January, 2013, no significant abnormality,  . Carotid artery disease ([place])    Doppler, January, 2013, 0 10% RIC A., 25-85% LICA, stable  . Cataract   . Complication of anesthesia    wakes up "mean"  . COPD (chronic obstructive pulmonary disease) ([place])   . Diabetes mellitus, type 2 ([place])   . Diverticula of colon   . Dizziness    occasional  . Ejection fraction   . GERD (gastroesophageal reflux disease)   . Goiter    hx of  . Headache(784.0)    migraine  . Hyperlipidemia   . Hypertension   . Hypothyroidism   . Left ventricular hypertrophy   . Leg edema    occasional swelling  . Obesity   . OSA (obstructive sleep apnea)    cpap setting of 12  . PONV (postoperative nausea and vomiting)    severe after every surgery  . Pulmonary hypertension, secondary   . Sleep apnea      Family History  Problem Relation Age of Onset  . Heart Problems Mother   . Heart defect Mother        valve problems  . Heart Problems Father   . Heart disease Brother   . Lung cancer Daughter        non small cell   .  Diabetes Maternal Aunt   . Diabetes Maternal Uncle   . Throat cancer Brother   . Colon cancer Neg Hx   . Colon polyps Neg Hx   . Esophageal cancer Neg Hx   . Rectal cancer Neg Hx   . Stomach cancer Neg Hx      Social History   Social History  . Marital status: Married    Spouse name: Jeneen Rinks  . Number of children: 3  . Years of education: 12   Occupational History  . retired    Social History Main Topics  . Smoking status: Former Smoker    Packs/day: 1.50    Years: 34.00    Types: Cigarettes    Quit date: 05/04/1997  . Smokeless tobacco: Never Used  . Alcohol use No  . Drug use: No  . Sexual activity: Not on file   Other Topics Concern  . Not on file   Social History Narrative   Patient lives at home with her husband Jeneen Rinks .   Retired.   Education 12 th grade.   Right handed   Caffeine two cups of coffee.  No Known Allergies   Outpatient Medications Prior to Visit  Medication Sig Dispense Refill  . acetaminophen (TYLENOL) 500 MG tablet Take 1,000 mg by mouth every 8 (eight) hours as needed for mild pain or moderate pain.    Marland Kitchen albuterol (PROAIR HFA) 108 (90 Base) MCG/ACT inhaler Inhale 2 puffs into the lungs every 6 (six) hours as needed for wheezing or shortness of breath. 3 Inhaler 3  . albuterol (PROVENTIL) (2.5 MG/3ML) 0.083% nebulizer solution Take 2.5 mg by nebulization every 6 (six) hours as needed for wheezing or shortness of breath.    Marland Kitchen amLODipine (NORVASC) 5 MG tablet Take 5 mg by mouth daily.     Marland Kitchen amoxicillin-clavulanate (AUGMENTIN) 875-125 MG tablet Take 1 tablet by mouth 2 (two) times daily.  0  . aspirin 81 MG tablet Take 81 mg by mouth daily.    . Cholecalciferol (D3-1000 PO) Take 1,000 Units by mouth daily.     . ferrous sulfate 325 (65 FE) MG tablet Take 325 mg by mouth 2 (two) times daily.  3  . fluticasone (FLONASE) 50 MCG/ACT nasal spray USE 2 SPRAYS NASALLY DAILY (Patient taking differently: Place 2 sprays into both nostrils 2 (two) times  daily. ) 48 g 3  . furosemide (LASIX) 20 MG tablet 20 mg daily.     Marland Kitchen gabapentin (NEURONTIN) 600 MG tablet Take 600 mg by mouth 3 (three) times daily.     Marland Kitchen HYDROcodone-acetaminophen (NORCO/VICODIN) 5-325 MG tablet Take 1-2 tablets by mouth every 6 (six) hours as needed for moderate pain. (Patient taking differently: Take 1-2 tablets by mouth every 4 (four) hours as needed for moderate pain. ) 30 tablet 0  . insulin glargine (LANTUS) 100 UNIT/ML injection Inject 100 Units into the skin 2 (two) times daily.     Marland Kitchen levothyroxine (SYNTHROID, LEVOTHROID) 200 MCG tablet Take 200 mcg by mouth daily before breakfast.    . LORazepam (ATIVAN) 2 MG tablet Take 2 mg by mouth daily as needed for anxiety. FOR DEPRESSION    . losartan-hydrochlorothiazide (HYZAAR) 100-25 MG tablet Take 1 tablet by mouth daily.    . metFORMIN (GLUCOPHAGE) 500 MG tablet Take 1,000 mg by mouth 2 (two) times daily with a meal.    . metoprolol tartrate (LOPRESSOR) 25 MG tablet Take 1 tablet (25 mg total) by mouth 2 (two) times daily. 180 tablet 3  . NEOMYCIN-POLYMYXIN-HYDROCORTISONE (CORTISPORIN) 1 % SOLN OTIC solution PLACE 4 DROPS INTO EAR 3-4 TIMES DAILY. LIE WITH AFFECTED EAR UPWARD  0  . nitroGLYCERIN (NITROSTAT) 0.4 MG SL tablet Place 1 tablet (0.4 mg total) under the tongue every 5 (five) minutes as needed for chest pain. 25 tablet 3  . ondansetron (ZOFRAN-ODT) 4 MG disintegrating tablet Take 1 tablet (4 mg total) by mouth every 6 (six) hours as needed for nausea. 20 tablet 0  . PHENADOZ 25 MG suppository Place 25 mg rectally daily as needed (headaches).     . predniSONE (DELTASONE) 10 MG tablet 30 mg daily.     . simvastatin (ZOCOR) 20 MG tablet Take 20 mg by mouth every evening.     . SYMBICORT 160-4.5 MCG/ACT inhaler INHALE 2 PUFFS INTO THE LUNGS 2 TIMES DAILY. 3 Inhaler 1  . vitamin B-12 (CYANOCOBALAMIN) 1000 MCG tablet Take 1,000 mcg by mouth daily.     Facility-Administered Medications Prior to Visit  Medication Dose  Route Frequency Provider Last Rate Last Dose  . 0.9 %  sodium chloride infusion  500 mL Intravenous Continuous Owens Loffler  P, MD            Objective:   Physical Exam Vitals:   02/26/17 1551  BP: 136/62  Pulse: 88  SpO2: 95%  Weight: 245 lb 3.2 oz (111.2 kg)  Height: 5\' 6"  (1.676 m)   Gen: Pleasant, obese woman, in no distress,  normal affect  ENT: No lesions,  mouth clear,  oropharynx clear, no postnasal drip  Neck: No JVD, no stridor  Lungs: No use of accessory muscles, clear without rales or rhonchi  Cardiovascular: RRR, heart sounds normal, no murmur or gallops, no peripheral edema  Musculoskeletal: No deformities, no cyanosis or clubbing  Neuro: alert, non focal  Skin: Warm, no lesions or rashes     Assessment & Plan:  OBSTRUCTIVE SLEEP APNEA Currently on AutoSet CPAP, appears to be tolerating  COPD With an apparent acute exacerbation although difficult to say whether this is subacute or slow progression of the underlying disease.  Very distant breath sounds but no overt wheezing.  She did not respond to recent prednisone burst.  I believe we need to rule out pneumonia, treat her for possible pneumonia versus bronchitis.  Repeat her prednisone taper to see if she benefits in conjunction with antibiotic.  If she does not respond to then possibly consider other etiologies for dyspnea and chest discomfort, possibly revisit cardiac eval  Baltazar Apo, MD, PhD 02/26/2017, 4:14 PM [facility] [phone] or if no answer [phone]

## 2017-02-26 NOTE — Patient Instructions (Signed)
Please take prednisone as directed until completely gone Tale doxycycline twice a day until completely gone.  Continue your Symbicort. Rinse and gargle after using  Use your albuterol nebulizer as needed for shortness of breath CXR today Follow with Dr Halford Chessman or APP here in 2 weeks to insure that you are improving.

## 2017-03-05 ENCOUNTER — Other Ambulatory Visit: Payer: Self-pay | Admitting: Emergency Medicine

## 2017-03-08 DIAGNOSIS — Z6839 Body mass index (BMI) 39.0-39.9, adult: Secondary | ICD-10-CM | POA: Diagnosis not present

## 2017-03-08 DIAGNOSIS — M545 Low back pain: Secondary | ICD-10-CM | POA: Diagnosis not present

## 2017-03-08 DIAGNOSIS — E1149 Type 2 diabetes mellitus with other diabetic neurological complication: Secondary | ICD-10-CM | POA: Diagnosis not present

## 2017-03-08 DIAGNOSIS — Z79899 Other long term (current) drug therapy: Secondary | ICD-10-CM | POA: Diagnosis not present

## 2017-03-08 DIAGNOSIS — M546 Pain in thoracic spine: Secondary | ICD-10-CM | POA: Diagnosis not present

## 2017-03-09 DIAGNOSIS — E119 Type 2 diabetes mellitus without complications: Secondary | ICD-10-CM | POA: Diagnosis not present

## 2017-03-09 DIAGNOSIS — Z6839 Body mass index (BMI) 39.0-39.9, adult: Secondary | ICD-10-CM | POA: Diagnosis not present

## 2017-03-09 DIAGNOSIS — E11621 Type 2 diabetes mellitus with foot ulcer: Secondary | ICD-10-CM | POA: Diagnosis not present

## 2017-03-11 ENCOUNTER — Ambulatory Visit (INDEPENDENT_AMBULATORY_CARE_PROVIDER_SITE_OTHER): Payer: Medicare HMO | Admitting: *Deleted

## 2017-03-11 ENCOUNTER — Telehealth: Payer: Self-pay | Admitting: Cardiology

## 2017-03-11 DIAGNOSIS — I495 Sick sinus syndrome: Secondary | ICD-10-CM | POA: Diagnosis not present

## 2017-03-11 NOTE — Telephone Encounter (Signed)
Spoke with pt and reminded pt of remote transmission that is due today. Pt verbalized understanding.   

## 2017-03-12 ENCOUNTER — Ambulatory Visit: Payer: Medicare HMO | Admitting: Adult Health

## 2017-03-12 ENCOUNTER — Encounter: Payer: Self-pay | Admitting: Cardiology

## 2017-03-12 NOTE — Progress Notes (Signed)
Remote pacemaker transmission.   

## 2017-03-16 LAB — CUP PACEART REMOTE DEVICE CHECK
Battery Remaining Longevity: 110 mo
Battery Voltage: 3.02 V
Brady Statistic AP VP Percent: 0.05 %
Brady Statistic AP VS Percent: 34.77 %
Brady Statistic AS VP Percent: 0.06 %
Brady Statistic AS VS Percent: 65.12 %
Brady Statistic RA Percent Paced: 34.79 %
Brady Statistic RV Percent Paced: 0.13 %
Date Time Interrogation Session: 20181108193630
Implantable Lead Implant Date: 20170707
Implantable Lead Implant Date: 20170707
Implantable Lead Location: 753859
Implantable Lead Location: 753860
Implantable Lead Model: 3830
Implantable Lead Model: 5076
Implantable Pulse Generator Implant Date: 20170707
Lead Channel Impedance Value: 304 Ohm
Lead Channel Impedance Value: 361 Ohm
Lead Channel Impedance Value: 399 Ohm
Lead Channel Impedance Value: 418 Ohm
Lead Channel Pacing Threshold Amplitude: 0.875 V
Lead Channel Pacing Threshold Amplitude: 1.875 V
Lead Channel Pacing Threshold Pulse Width: 0.4 ms
Lead Channel Pacing Threshold Pulse Width: 0.4 ms
Lead Channel Sensing Intrinsic Amplitude: 6.375 mV
Lead Channel Sensing Intrinsic Amplitude: 6.375 mV
Lead Channel Sensing Intrinsic Amplitude: 6.875 mV
Lead Channel Sensing Intrinsic Amplitude: 6.875 mV
Lead Channel Setting Pacing Amplitude: 1.75 V
Lead Channel Setting Pacing Amplitude: 2.5 V
Lead Channel Setting Pacing Pulse Width: 1 ms
Lead Channel Setting Sensing Sensitivity: 2.8 mV

## 2017-03-23 ENCOUNTER — Telehealth: Payer: Self-pay | Admitting: Pulmonary Disease

## 2017-03-23 MED ORDER — ALBUTEROL SULFATE HFA 108 (90 BASE) MCG/ACT IN AERS: 2.0000 | INHALATION_SPRAY | Freq: Four times a day (QID) | RESPIRATORY_TRACT | 1 refills | Status: DC | PRN

## 2017-03-23 NOTE — Telephone Encounter (Signed)
Spoke with patient. She stated that the ProAir is $80 more than Ventolin. She would like to have a 90 day supply sent to [facility]. Advised patient that I would send this in today for her. She verbalized understanding.   Nothing else needed at time of call.

## 2017-04-06 DIAGNOSIS — Z6838 Body mass index (BMI) 38.0-38.9, adult: Secondary | ICD-10-CM | POA: Diagnosis not present

## 2017-04-06 DIAGNOSIS — M545 Low back pain: Secondary | ICD-10-CM | POA: Diagnosis not present

## 2017-04-06 DIAGNOSIS — Z79899 Other long term (current) drug therapy: Secondary | ICD-10-CM | POA: Diagnosis not present

## 2017-04-06 DIAGNOSIS — E1149 Type 2 diabetes mellitus with other diabetic neurological complication: Secondary | ICD-10-CM | POA: Diagnosis not present

## 2017-04-06 DIAGNOSIS — E782 Mixed hyperlipidemia: Secondary | ICD-10-CM | POA: Diagnosis not present

## 2017-04-06 DIAGNOSIS — E890 Postprocedural hypothyroidism: Secondary | ICD-10-CM | POA: Diagnosis not present

## 2017-04-06 DIAGNOSIS — R609 Edema, unspecified: Secondary | ICD-10-CM | POA: Diagnosis not present

## 2017-05-10 ENCOUNTER — Encounter: Payer: Self-pay | Admitting: Pulmonary Disease

## 2017-05-10 ENCOUNTER — Ambulatory Visit: Payer: Medicare HMO | Admitting: Pulmonary Disease

## 2017-05-10 VITALS — BP 138/80 | HR 75 | Ht 65.0 in | Wt 242.0 lb

## 2017-05-10 DIAGNOSIS — J449 Chronic obstructive pulmonary disease, unspecified: Secondary | ICD-10-CM | POA: Diagnosis not present

## 2017-05-10 DIAGNOSIS — R058 Other specified cough: Secondary | ICD-10-CM

## 2017-05-10 DIAGNOSIS — G4733 Obstructive sleep apnea (adult) (pediatric): Secondary | ICD-10-CM | POA: Diagnosis not present

## 2017-05-10 DIAGNOSIS — R05 Cough: Secondary | ICD-10-CM | POA: Diagnosis not present

## 2017-05-10 MED ORDER — DOXYCYCLINE HYCLATE 100 MG PO TABS: 100.0000 mg | ORAL_TABLET | Freq: Two times a day (BID) | ORAL | 0 refills | Status: DC

## 2017-05-10 MED ORDER — CHLORPHENIRAMINE MALEATE 4 MG PO TABS: 4.0000 mg | ORAL_TABLET | Freq: Every day | ORAL | 0 refills | Status: DC

## 2017-05-10 NOTE — Progress Notes (Signed)
[facility]  Chief Complaint  Patient presents with  . Follow-up    Pt has productive cough-yellow, patient breathing has worsen in last 3 months. Increase in SOB with exertion, wheezing and chest tightness worsen in last few weeks. When talking with pt in exam room pt wheezing to talk, and had SOB while talking. Pt uses cpap machine each night [facility]; did not have SD card today.    Vital signs: BP 138/80 (BP Location: Left Arm, Cuff Size: Normal)   Pulse 75   Ht 5\' 5"  (1.651 m)   Wt 242 lb (109.8 kg)   SpO2 96%   BMI 40.27 kg/m   History of Present Illness: Kayla [name] is a 77 y.o. female COPD, OSA.  She has more cough and wheeze.  He throat is sore.  She isn't bring up sputum.  She has raspy voice.  She started prednisone, but no help.  She is not having fever.  She denies reflux.  She feels CPAP helps.  She has been using flonase and symbicort.   Physical Exam:  General - pleasant Eyes - pupils reactive ENT - no sinus tenderness, clear nasal drainage, no oral exudate, no LAN, wheeze over neck area Cardiac - regular, no murmur Chest - no wheeze, rales Abd - soft, non tender Ext - no edema Skin - no rashes Neuro - normal strength Psych - normal mood  Discussion: She has cough, wheeze.  Much of her symptoms are from upper airway.  Assessment/Plan:  Upper airway cough syndrome. - doxycycline  - flonase, nasal irrigation - salt water gargles, sip water, voice rest  COPD. - continue inhalers  OSA. - she is compliant with CPAP   Patient Instructions  Doxycycline 100 mg twice per day  Chlorpheniramine 4 mg nightly Continue flonase daily Nasal irrigation daily Sip water with urge to cough Salt water gargles as needed  Finish course of prednisone  Follow up in 8 weeks    Chesley Mires, MD [place] 05/10/2017, 4:38 PM Pager:  [phone]  Flow Sheet  Sleep tests: PSG 02/2005 >> AHI 10    Auto CPAP 09/21/12 to 10/05/12 >> used on 15 of 15 nights with average 10 hrs 22 min.  Average AHI 1.8 with mean CPAP 5 cm H2O and 90 th percentile CPAP 6 cm H2O. CPAP titration 02/19/14 >> CPAP 11 cm H2O >> AHI 0.7, PLMI 81.4, did not need supplemental oxygen  Pulmonary tests Spirometry 10/19/08 >> FEV1 1.55 (68%), FEV1% 70  Spirometry 08/29/12 >> FEV1 1.38 (59%), FEV1% 58 FeNO 03/09/16 >> 7  Cardiac tests Echo 07/31/13 >> mild LVH, EF 55 to 60%, grade 1 DD  Past Medical History: She  has a past medical history of Allergy, Anxiety, Arthritis, CAD (coronary artery disease), Carotid artery disease ([place]), Cataract, Complication of anesthesia, COPD (chronic obstructive pulmonary disease) ([place]), Diabetes mellitus, type 2 ([place]), Diverticula of colon, Dizziness, Ejection fraction, GERD (gastroesophageal reflux disease), Goiter, Headache(784.0), Hyperlipidemia, Hypertension, Hypothyroidism, Left ventricular hypertrophy, Leg edema, Obesity, OSA (obstructive sleep apnea), PONV (postoperative nausea and vomiting), Pulmonary hypertension, secondary, and Sleep apnea.  Past Surgical History: She  has a past surgical history that includes Thyroidectomy (2011); Total knee arthroplasty (07/27/2011); Abdominal hysterectomy; Anterior cervical decomp/discectomy fusion (N/A, 11/07/2014); Cardiac catheterization (N/A, 11/08/2015); Lumbar disc surgery (1990's); Cholecystectomy (N/A, 04/29/2016); Upper esophageal endoscopic ultrasound (eus) (N/A, 05/14/2016); Cataract extraction w/ intraocular lens  implant, bilateral (Bilateral); Insert / replace / remove pacemaker (2017); Cardiac catheterization; Back surgery;  Appendectomy; Joint replacement; and Artery Biopsy (Right, 02/16/2017).  Family History: Her family history includes Diabetes in her maternal aunt and maternal uncle; Heart Problems in her father and mother; Heart defect in her mother; Heart disease in her brother; Lung cancer in her daughter; Throat cancer in her  brother.  Social History: She  reports that she quit smoking about 20 years ago. Her smoking use included cigarettes. She has a 51.00 pack-year smoking history. she has never used smokeless tobacco. She reports that she does not drink alcohol or use drugs.  Medications: Allergies as of 05/10/2017   No Known Allergies     Medication List        Accurate as of 05/10/17  4:38 PM. Always use your most recent med list.          acetaminophen 500 MG tablet Commonly known as:  TYLENOL Take 1,000 mg by mouth every 8 (eight) hours as needed for mild pain or moderate pain.   albuterol (2.5 MG/3ML) 0.083% nebulizer solution Commonly known as:  PROVENTIL Take 2.5 mg by nebulization every 6 (six) hours as needed for wheezing or shortness of breath.   albuterol 108 (90 Base) MCG/ACT inhaler Commonly known as:  PROVENTIL HFA;VENTOLIN HFA Inhale 2 puffs into the lungs every 6 (six) hours as needed for wheezing or shortness of breath.   amLODipine 5 MG tablet Commonly known as:  NORVASC Take 5 mg by mouth daily.   aspirin 81 MG tablet Take 81 mg by mouth daily.   budesonide-formoterol 160-4.5 MCG/ACT inhaler Commonly known as:  SYMBICORT INHALE 2 PUFFS INTO THE LUNGS 2 TIMES DAILY.   chlorpheniramine 4 MG tablet Commonly known as:  CHLOR-TRIMETON Take 1 tablet (4 mg total) by mouth at bedtime.   D3-1000 PO Take 1,000 Units by mouth daily.   doxycycline 100 MG tablet Commonly known as:  VIBRA-TABS Take 1 tablet (100 mg total) by mouth 2 (two) times daily.   ferrous sulfate 325 (65 FE) MG tablet Take 325 mg by mouth 2 (two) times daily.   fluticasone 50 MCG/ACT nasal spray Commonly known as:  FLONASE USE 2 SPRAYS NASALLY DAILY   furosemide 20 MG tablet Commonly known as:  LASIX 20 mg daily.   gabapentin 600 MG tablet Commonly known as:  NEURONTIN Take 600 mg by mouth 3 (three) times daily.   HYDROcodone-acetaminophen 5-325 MG tablet Commonly known as:  NORCO/VICODIN Take  1-2 tablets by mouth every 6 (six) hours as needed for moderate pain.   insulin glargine 100 UNIT/ML injection Commonly known as:  LANTUS Inject 100 Units into the skin 2 (two) times daily.   levothyroxine 200 MCG tablet Commonly known as:  SYNTHROID, LEVOTHROID Take 200 mcg by mouth daily before breakfast.   LORazepam 2 MG tablet Commonly known as:  ATIVAN Take 2 mg by mouth daily as needed for anxiety. FOR DEPRESSION   losartan-hydrochlorothiazide 100-25 MG tablet Commonly known as:  HYZAAR Take 1 tablet by mouth daily.   metFORMIN 500 MG tablet Commonly known as:  GLUCOPHAGE Take 1,000 mg by mouth 2 (two) times daily with a meal.   metoprolol tartrate 25 MG tablet Commonly known as:  LOPRESSOR Take 1 tablet (25 mg total) by mouth 2 (two) times daily.   nitroGLYCERIN 0.4 MG SL tablet Commonly known as:  NITROSTAT Place 1 tablet (0.4 mg total) under the tongue every 5 (five) minutes as needed for chest pain.   PHENADOZ 25 MG suppository Generic drug:  promethazine Place 25 mg  rectally daily as needed (headaches).   simvastatin 20 MG tablet Commonly known as:  ZOCOR Take 20 mg by mouth every evening.   vitamin B-12 1000 MCG tablet Commonly known as:  CYANOCOBALAMIN Take 1,000 mcg by mouth daily.

## 2017-05-10 NOTE — Patient Instructions (Addendum)
Doxycycline 100 mg twice per day  Chlorpheniramine 4 mg nightly Continue flonase daily Nasal irrigation daily Sip water with urge to cough Salt water gargles as needed  Finish course of prednisone  Follow up in 8 weeks

## 2017-05-24 ENCOUNTER — Ambulatory Visit: Payer: Medicare HMO | Admitting: Podiatry

## 2017-05-24 DIAGNOSIS — E1149 Type 2 diabetes mellitus with other diabetic neurological complication: Secondary | ICD-10-CM | POA: Diagnosis not present

## 2017-05-24 DIAGNOSIS — L84 Corns and callosities: Secondary | ICD-10-CM

## 2017-05-24 DIAGNOSIS — B351 Tinea unguium: Secondary | ICD-10-CM | POA: Diagnosis not present

## 2017-05-24 DIAGNOSIS — M79674 Pain in right toe(s): Secondary | ICD-10-CM | POA: Diagnosis not present

## 2017-05-24 DIAGNOSIS — M79675 Pain in left toe(s): Secondary | ICD-10-CM | POA: Diagnosis not present

## 2017-05-24 NOTE — Progress Notes (Signed)
Subjective:    Patient ID: Kayla [name], female    DOB: Apr 07, 1941, 77 y.o.   MRN: [id number]  HPI 77 year old female presents the office today for concerns of her left big toenail becoming thick and discolored and she cannot trim it herself.  She also has a callus to the toe pointed medial aspect.  She states that she has a scar to this area she caught it when she was a kid and she is always had a callus along this area.  She also states that her other nails are thick and discolored and cause irritation side shoes.  She denies any redness or drainage or any swelling from the toenail site.  The left hallux toenail was previously removed in 2014and she states that it grew back and thick and discolored.  She has no other concerns today.   Review of Systems  All other systems reviewed and are negative.  Past Medical History:  Diagnosis Date  . Allergy   . Anxiety   . Arthritis   . CAD (coronary artery disease)    Mild per remote cath in 2006, /    Nuclear, January, 2013, no significant abnormality,  . Carotid artery disease ([place])    Doppler, January, 2013, 0 25% RIC A., 63-89% LICA, stable  . Cataract   . Complication of anesthesia    wakes up "mean"  . COPD (chronic obstructive pulmonary disease) ([place])   . Diabetes mellitus, type 2 ([place])   . Diverticula of colon   . Dizziness    occasional  . Ejection fraction   . GERD (gastroesophageal reflux disease)   . Goiter    hx of  . Headache(784.0)    migraine  . Hyperlipidemia   . Hypertension   . Hypothyroidism   . Left ventricular hypertrophy   . Leg edema    occasional swelling  . Obesity   . OSA (obstructive sleep apnea)    cpap setting of 12  . PONV (postoperative nausea and vomiting)    severe after every surgery  . Pulmonary hypertension, secondary   . Sleep apnea     Past Surgical History:  Procedure Laterality Date  . ABDOMINAL HYSTERECTOMY     with appendectomy and colon polypectomy  . ANTERIOR CERVICAL  DECOMP/DISCECTOMY FUSION N/A 11/07/2014   Procedure: Anterior Cervical diskectomy and fusion Cervical four-five, Cervical five-six, Cervical six-seven;  Surgeon: Kary Kos, MD;  Location: [facility];  Service: Neurosurgery;  Laterality: N/A;  . APPENDECTOMY    . ARTERY BIOPSY Right 02/16/2017   Procedure: BIOPSY TEMPORAL ARTERY;  Surgeon: Katha Cabal, MD;  Location: [facility];  Service: Vascular;  Laterality: Right;  . BACK SURGERY    . CARDIAC CATHETERIZATION    . CATARACT EXTRACTION W/ INTRAOCULAR LENS  IMPLANT, BILATERAL Bilateral   . CHOLECYSTECTOMY N/A 04/29/2016   Procedure: LAPAROSCOPIC CHOLECYSTECTOMY WITH INTRAOPERATIVE CHOLANGIOGRAM;  Surgeon: Alphonsa Overall, MD;  Location: [facility];  Service: General;  Laterality: N/A;  . EP IMPLANTABLE DEVICE N/A 11/08/2015   Procedure: Pacemaker Implant;  Surgeon: Deboraha Sprang, MD;  Location: [facility];  Service: Cardiovascular;  Laterality: N/A;  . INSERT / REPLACE / REMOVE PACEMAKER  2017   Dr. Jens Som [facility])  . JOINT REPLACEMENT    . LUMBAR DISC SURGERY  1990's   x 2  . THYROIDECTOMY  2011  . TOTAL KNEE ARTHROPLASTY  07/27/2011   Procedure: TOTAL KNEE ARTHROPLASTY;  Surgeon: Mauri Pole, MD;  Location: [facility];  Service: Orthopedics;  Laterality: Left;  . UPPER ESOPHAGEAL ENDOSCOPIC ULTRASOUND (EUS) N/A 05/14/2016   Procedure: UPPER ESOPHAGEAL ENDOSCOPIC ULTRASOUND (EUS);  Surgeon: Milus Banister, MD;  Location: [facility];  Service: Endoscopy;  Laterality: N/A;  radial only-will be done mod if needed     Current Outpatient Medications:  .  acetaminophen (TYLENOL) 500 MG tablet, Take 1,000 mg by mouth every 8 (eight) hours as needed for mild pain or moderate pain., Disp: , Rfl:  .  albuterol (PROVENTIL HFA;VENTOLIN HFA) 108 (90 Base) MCG/ACT inhaler, Inhale 2 puffs into the lungs every 6 (six) hours as needed for wheezing or shortness of breath., Disp: 3 Inhaler, Rfl: 1 .  albuterol (PROVENTIL) (2.5 MG/3ML) 0.083%  nebulizer solution, Take 2.5 mg by nebulization every 6 (six) hours as needed for wheezing or shortness of breath., Disp: , Rfl:  .  amLODipine (NORVASC) 5 MG tablet, Take 5 mg by mouth daily. , Disp: , Rfl:  .  aspirin 81 MG tablet, Take 81 mg by mouth daily., Disp: , Rfl:  .  budesonide-formoterol (SYMBICORT) 160-4.5 MCG/ACT inhaler, INHALE 2 PUFFS INTO THE LUNGS 2 TIMES DAILY., Disp: 3 Inhaler, Rfl: 1 .  chlorpheniramine (CHLOR-TRIMETON) 4 MG tablet, Take 1 tablet (4 mg total) by mouth at bedtime., Disp: 14 tablet, Rfl: 0 .  Cholecalciferol (D3-1000 PO), Take 1,000 Units by mouth daily. , Disp: , Rfl:  .  doxycycline (VIBRA-TABS) 100 MG tablet, Take 1 tablet (100 mg total) by mouth 2 (two) times daily., Disp: 14 tablet, Rfl: 0 .  ferrous sulfate 325 (65 FE) MG tablet, Take 325 mg by mouth 2 (two) times daily., Disp: , Rfl: 3 .  fluticasone (FLONASE) 50 MCG/ACT nasal spray, USE 2 SPRAYS NASALLY DAILY, Disp: 48 g, Rfl: 1 .  furosemide (LASIX) 20 MG tablet, 20 mg daily. , Disp: , Rfl:  .  gabapentin (NEURONTIN) 600 MG tablet, Take 600 mg by mouth 3 (three) times daily. , Disp: , Rfl:  .  HYDROcodone-acetaminophen (NORCO/VICODIN) 5-325 MG tablet, Take 1-2 tablets by mouth every 6 (six) hours as needed for moderate pain. (Patient taking differently: Take 1-2 tablets by mouth every 4 (four) hours as needed for moderate pain. ), Disp: 30 tablet, Rfl: 0 .  insulin glargine (LANTUS) 100 UNIT/ML injection, Inject 100 Units into the skin 2 (two) times daily. , Disp: , Rfl:  .  levothyroxine (SYNTHROID, LEVOTHROID) 200 MCG tablet, Take 200 mcg by mouth daily before breakfast., Disp: , Rfl:  .  LORazepam (ATIVAN) 2 MG tablet, Take 2 mg by mouth daily as needed for anxiety. FOR DEPRESSION, Disp: , Rfl:  .  losartan-hydrochlorothiazide (HYZAAR) 100-25 MG tablet, Take 1 tablet by mouth daily., Disp: , Rfl:  .  metFORMIN (GLUCOPHAGE) 500 MG tablet, Take 1,000 mg by mouth 2 (two) times daily with a meal., Disp: ,  Rfl:  .  metoprolol tartrate (LOPRESSOR) 25 MG tablet, Take 1 tablet (25 mg total) by mouth 2 (two) times daily., Disp: 180 tablet, Rfl: 3 .  nitroGLYCERIN (NITROSTAT) 0.4 MG SL tablet, Place 1 tablet (0.4 mg total) under the tongue every 5 (five) minutes as needed for chest pain., Disp: 25 tablet, Rfl: 3 .  PHENADOZ 25 MG suppository, Place 25 mg rectally daily as needed (headaches). , Disp: , Rfl:  .  simvastatin (ZOCOR) 20 MG tablet, Take 20 mg by mouth every evening. , Disp: , Rfl:  .  vitamin B-12 (CYANOCOBALAMIN) 1000 MCG tablet, Take 1,000 mcg by mouth daily., Disp: ,  Rfl:   Current Facility-Administered Medications:  .  0.9 %  sodium chloride infusion, 500 mL, Intravenous, Continuous, Milus Banister, MD  No Known Allergies  Social History   Socioeconomic History  . Marital status: Married    Spouse name: Jeneen Rinks  . Number of children: 3  . Years of education: 78  . Highest education level: Not on file  Social Needs  . Financial resource strain: Not on file  . Food insecurity - worry: Not on file  . Food insecurity - inability: Not on file  . Transportation needs - medical: Not on file  . Transportation needs - non-medical: Not on file  Occupational History  . Occupation: retired  Tobacco Use  . Smoking status: Former Smoker    Packs/day: 1.50    Years: 34.00    Pack years: 51.00    Types: Cigarettes    Last attempt to quit: 05/04/1997    Years since quitting: 20.0  . Smokeless tobacco: Never Used  Substance and Sexual Activity  . Alcohol use: No  . Drug use: No  . Sexual activity: Not on file  Other Topics Concern  . Not on file  Social History Narrative   Patient lives at home with her husband Jeneen Rinks .   Retired.   Education 12 th grade.   Right handed   Caffeine two cups of coffee.        Objective:   Physical Exam  General: AAO x3, NAD  Dermatological: Nails are hypertrophic, dystrophic, brittle, discolored, elongated 10.  Most notably the left  hallux toenail is more hypertrophic, dystrophic and brittle is loosely underlying nail bed distally.  There is no drainage or pus or any clinical signs of infection.  No surrounding redness or drainage. Tenderness nails 1-5 bilaterally.  Hyperkeratotic lesion left medial hallux.  Upon debridement there is no underlying ulceration, drainage or any signs of infection.  No open lesions or pre-ulcerative lesions are identified today.  Vascular: Dorsalis Pedis artery and Posterior Tibial artery pedal pulses are 2/4 bilateral with immedate capillary fill time.There is no pain with calf compression, swelling, warmth, erythema.   Neruologic: Sensation decreased mildly with SWMF to the digits mostly the left hallux toenail.   Musculoskeletal: No gross boney pedal deformities bilateral. No pain, crepitus, or limitation noted with foot and ankle range of motion bilateral. Muscular strength 5/5 in all groups tested bilateral.  Gait: Unassisted, Nonantalgic.      Assessment & Plan:  77 year old female with symptomatic onychomycosis, left hallux significant hypertrophy, hyperkeratotic tissue -Treatment options discussed including all alternatives, risks, and complications -Etiology of symptoms were discussed -Nails debrided 10 without complications or bleeding. -I was able to debride the left hallux toenail today without any complications or bleeding.  I debrided the loose nail without any complications.  Discussed nail avulsion but likely can go back in the same way and she does not want to have a permanent nail avulsion.  There is no clinical signs of infection but continue to monitor closely. -Hyperkeratotic lesion was sharply debrided x1 without any complications or bleeding. -Daily foot inspection -Follow-up in 3 months or sooner if any problems arise. In the meantime, encouraged to call the office with any questions, concerns, change in symptoms.   Celesta Gentile, DPM

## 2017-05-24 NOTE — Patient Instructions (Signed)

## 2017-06-10 ENCOUNTER — Ambulatory Visit (INDEPENDENT_AMBULATORY_CARE_PROVIDER_SITE_OTHER): Payer: Medicare HMO | Admitting: *Deleted

## 2017-06-10 DIAGNOSIS — I495 Sick sinus syndrome: Secondary | ICD-10-CM

## 2017-06-10 NOTE — Progress Notes (Signed)
Remote pacemaker transmission.   

## 2017-06-16 ENCOUNTER — Encounter: Payer: Self-pay | Admitting: Cardiology

## 2017-06-18 DIAGNOSIS — E1149 Type 2 diabetes mellitus with other diabetic neurological complication: Secondary | ICD-10-CM | POA: Diagnosis not present

## 2017-06-18 DIAGNOSIS — J449 Chronic obstructive pulmonary disease, unspecified: Secondary | ICD-10-CM | POA: Diagnosis not present

## 2017-06-18 DIAGNOSIS — E1121 Type 2 diabetes mellitus with diabetic nephropathy: Secondary | ICD-10-CM | POA: Diagnosis not present

## 2017-06-18 DIAGNOSIS — E559 Vitamin D deficiency, unspecified: Secondary | ICD-10-CM | POA: Diagnosis not present

## 2017-06-18 DIAGNOSIS — N183 Chronic kidney disease, stage 3 (moderate): Secondary | ICD-10-CM | POA: Diagnosis not present

## 2017-06-18 DIAGNOSIS — E890 Postprocedural hypothyroidism: Secondary | ICD-10-CM | POA: Diagnosis not present

## 2017-06-22 DIAGNOSIS — E1121 Type 2 diabetes mellitus with diabetic nephropathy: Secondary | ICD-10-CM | POA: Diagnosis not present

## 2017-06-25 ENCOUNTER — Ambulatory Visit: Payer: Medicare HMO | Admitting: Pulmonary Disease

## 2017-07-02 LAB — CUP PACEART REMOTE DEVICE CHECK
Battery Remaining Longevity: 106 mo
Battery Voltage: 3.01 V
Brady Statistic AP VP Percent: 0.09 %
Brady Statistic AP VS Percent: 68.28 %
Brady Statistic AS VP Percent: 0.03 %
Brady Statistic AS VS Percent: 31.6 %
Brady Statistic RA Percent Paced: 68.31 %
Brady Statistic RV Percent Paced: 0.13 %
Date Time Interrogation Session: 20190207152803
Implantable Lead Implant Date: 20170707
Implantable Lead Implant Date: 20170707
Implantable Lead Location: 753859
Implantable Lead Location: 753860
Implantable Lead Model: 3830
Implantable Lead Model: 5076
Implantable Pulse Generator Implant Date: 20170707
Lead Channel Impedance Value: 323 Ohm
Lead Channel Impedance Value: 380 Ohm
Lead Channel Impedance Value: 399 Ohm
Lead Channel Impedance Value: 418 Ohm
Lead Channel Pacing Threshold Amplitude: 0.875 V
Lead Channel Pacing Threshold Amplitude: 1.875 V
Lead Channel Pacing Threshold Pulse Width: 0.4 ms
Lead Channel Pacing Threshold Pulse Width: 0.4 ms
Lead Channel Sensing Intrinsic Amplitude: 5.5 mV
Lead Channel Sensing Intrinsic Amplitude: 5.5 mV
Lead Channel Sensing Intrinsic Amplitude: 6.5 mV
Lead Channel Sensing Intrinsic Amplitude: 6.5 mV
Lead Channel Setting Pacing Amplitude: 1.75 V
Lead Channel Setting Pacing Amplitude: 2.5 V
Lead Channel Setting Pacing Pulse Width: 1 ms
Lead Channel Setting Sensing Sensitivity: 2.8 mV

## 2017-07-20 DIAGNOSIS — G4733 Obstructive sleep apnea (adult) (pediatric): Secondary | ICD-10-CM | POA: Diagnosis not present

## 2017-07-22 ENCOUNTER — Encounter: Payer: Self-pay | Admitting: *Deleted

## 2017-08-02 ENCOUNTER — Encounter (INDEPENDENT_AMBULATORY_CARE_PROVIDER_SITE_OTHER): Payer: Self-pay

## 2017-08-02 ENCOUNTER — Ambulatory Visit: Payer: Medicare HMO | Admitting: Cardiology

## 2017-08-02 ENCOUNTER — Encounter: Payer: Self-pay | Admitting: Cardiology

## 2017-08-02 VITALS — BP 92/58 | HR 67 | Ht 65.0 in | Wt 237.4 lb

## 2017-08-02 DIAGNOSIS — R002 Palpitations: Secondary | ICD-10-CM | POA: Diagnosis not present

## 2017-08-02 DIAGNOSIS — I495 Sick sinus syndrome: Secondary | ICD-10-CM

## 2017-08-02 DIAGNOSIS — I493 Ventricular premature depolarization: Secondary | ICD-10-CM | POA: Diagnosis not present

## 2017-08-02 DIAGNOSIS — Z950 Presence of cardiac pacemaker: Secondary | ICD-10-CM

## 2017-08-02 NOTE — Patient Instructions (Signed)
Medication Instructions:  The current medical regimen is effective;  continue present plan and medications.  Follow-Up: Follow up as needed with Dr. Marlou Porch.  Follow up with Dr Caryl Comes as scheduled.  If you need a refill on your cardiac medications before your next appointment, please call your pharmacy.  Thank you for choosing [facility]!!

## 2017-08-02 NOTE — Progress Notes (Signed)
Cardiology Office Note    Date:  08/02/2017   ID:  Kayla, [name] 15-Dec-1940, MRN [id number]  PCP:  Isaias Sakai, DO  Cardiologist:   Candee Furbish, MD     History of Present Illness:  Kayla [name] is a 77 y.o. female former patient of Dr. Ron Parker also followed by Dr. Caryl Comes for pacemaker here for follow-up. Had cholecystectomy on 04/29/16 for biliary dyskinesia. Was seen previously on 10/09/14 by Margarite Gouge for preoperative risk assessment for Dr. Saintclair Halsted, surgical cervical fusion.   Overall she has been doing very well.  Her energy level seems to have improved. Her blood pressure has been in the 150 range at home. At one point her metoprolol was stopped because of her bradycardia but now since she has a pacemaker, this has been restarted on 08/24/16.  Low risk Myoview in 2013, catheterization 2006 nonobstructive CAD echocardiogram 2015 normal EF  48 hour Holter monitor 2015 PACs, PVCs, 3 beat PAT.  Event recorder - junctional rhythm high 30's and low 40's at times associated with dizziness. This prompted pacer.  Carotid Dopplers 40-59% left internal carotid, 39% right.  History of COPD, long-standing smoking. Dr. Halford Chessman. Chronic dyspnea on exertion no change.  08/02/17 - BP yesterday 152 SBP. Took all Meds. COPD is bad. No fevers or chills.  It is unusual for her to have lower blood pressure.  She is feeling some slight dizziness.  We have given her water, fluids.  I am instructed her to hold her Lasix 20 mg.  Continue to watch this closely.  Still having shortness of breath at baseline from her COPD.   Past Medical History:  Diagnosis Date  . Allergy   . Anxiety   . Arthritis   . CAD (coronary artery disease)    Mild per remote cath in 2006, /    Nuclear, January, 2013, no significant abnormality,  . Carotid artery disease ([place])    Doppler, January, 2013, 0 52% RIC A., 77-82% LICA, stable  . Cataract   . Complication of anesthesia    wakes up "mean"  . COPD  (chronic obstructive pulmonary disease) ([place])   . Diabetes mellitus, type 2 ([place])   . Diverticula of colon   . Dizziness    occasional  . Ejection fraction   . GERD (gastroesophageal reflux disease)   . Goiter    hx of  . Headache(784.0)    migraine  . Hyperlipidemia   . Hypertension   . Hypothyroidism   . Left ventricular hypertrophy   . Leg edema    occasional swelling  . Obesity   . OSA (obstructive sleep apnea)    cpap setting of 12  . PONV (postoperative nausea and vomiting)    severe after every surgery  . Pulmonary hypertension, secondary   . Sleep apnea     Past Surgical History:  Procedure Laterality Date  . ABDOMINAL HYSTERECTOMY     with appendectomy and colon polypectomy  . ANTERIOR CERVICAL DECOMP/DISCECTOMY FUSION N/A 11/07/2014   Procedure: Anterior Cervical diskectomy and fusion Cervical four-five, Cervical five-six, Cervical six-seven;  Surgeon: Kary Kos, MD;  Location: [facility];  Service: Neurosurgery;  Laterality: N/A;  . APPENDECTOMY    . ARTERY BIOPSY Right 02/16/2017   Procedure: BIOPSY TEMPORAL ARTERY;  Surgeon: Katha Cabal, MD;  Location: [facility];  Service: Vascular;  Laterality: Right;  . BACK SURGERY    . CARDIAC CATHETERIZATION    . CATARACT EXTRACTION W/ INTRAOCULAR LENS  IMPLANT, BILATERAL Bilateral   . CHOLECYSTECTOMY N/A 04/29/2016   Procedure: LAPAROSCOPIC CHOLECYSTECTOMY WITH INTRAOPERATIVE CHOLANGIOGRAM;  Surgeon: Alphonsa Overall, MD;  Location: [facility];  Service: General;  Laterality: N/A;  . EP IMPLANTABLE DEVICE N/A 11/08/2015   Procedure: Pacemaker Implant;  Surgeon: Deboraha Sprang, MD;  Location: [facility];  Service: Cardiovascular;  Laterality: N/A;  . INSERT / REPLACE / REMOVE PACEMAKER  2017   Dr. Jens Som [facility])  . JOINT REPLACEMENT    . LUMBAR DISC SURGERY  1990's   x 2  . THYROIDECTOMY  2011  . TOTAL KNEE ARTHROPLASTY  07/27/2011   Procedure: TOTAL KNEE ARTHROPLASTY;  Surgeon: Mauri Pole, MD;  Location:  [facility];  Service: Orthopedics;  Laterality: Left;  . UPPER ESOPHAGEAL ENDOSCOPIC ULTRASOUND (EUS) N/A 05/14/2016   Procedure: UPPER ESOPHAGEAL ENDOSCOPIC ULTRASOUND (EUS);  Surgeon: Milus Banister, MD;  Location: [facility];  Service: Endoscopy;  Laterality: N/A;  radial only-will be done mod if needed    Current Medications: Outpatient Medications Prior to Visit  Medication Sig Dispense Refill  . acetaminophen (TYLENOL) 500 MG tablet Take 1,000 mg by mouth every 8 (eight) hours as needed for mild pain or moderate pain.    Marland Kitchen albuterol (PROVENTIL HFA;VENTOLIN HFA) 108 (90 Base) MCG/ACT inhaler Inhale 2 puffs into the lungs every 6 (six) hours as needed for wheezing or shortness of breath. 3 Inhaler 1  . albuterol (PROVENTIL) (2.5 MG/3ML) 0.083% nebulizer solution Take 2.5 mg by nebulization every 6 (six) hours as needed for wheezing or shortness of breath.    Marland Kitchen amLODipine (NORVASC) 5 MG tablet Take 5 mg by mouth daily.     Marland Kitchen aspirin 81 MG tablet Take 81 mg by mouth daily.    . budesonide-formoterol (SYMBICORT) 160-4.5 MCG/ACT inhaler INHALE 2 PUFFS INTO THE LUNGS 2 TIMES DAILY. 3 Inhaler 1  . chlorpheniramine (CHLOR-TRIMETON) 4 MG tablet Take 1 tablet (4 mg total) by mouth at bedtime. 14 tablet 0  . Cholecalciferol (D3-1000 PO) Take 1,000 Units by mouth daily.     . ferrous sulfate 325 (65 FE) MG tablet Take 325 mg by mouth 2 (two) times daily.  3  . fluticasone (FLONASE) 50 MCG/ACT nasal spray USE 2 SPRAYS NASALLY DAILY 48 g 1  . furosemide (LASIX) 20 MG tablet 20 mg daily.     Marland Kitchen gabapentin (NEURONTIN) 600 MG tablet Take 600 mg by mouth 3 (three) times daily.     . hydrochlorothiazide (HYDRODIURIL) 50 MG tablet Take 50 mg by mouth daily.  0  . HYDROcodone-acetaminophen (NORCO/VICODIN) 5-325 MG tablet Take 1-2 tablets by mouth every 6 (six) hours as needed for moderate pain. (Patient taking differently: Take 1-2 tablets by mouth every 4 (four) hours as needed for moderate pain. ) 30 tablet 0    . insulin glargine (LANTUS) 100 UNIT/ML injection Inject 100 Units into the skin 2 (two) times daily.     Marland Kitchen levothyroxine (SYNTHROID, LEVOTHROID) 175 MCG tablet Take 1 tablet by mouth daily.    Marland Kitchen LORazepam (ATIVAN) 2 MG tablet Take 2 mg by mouth daily as needed for anxiety. FOR DEPRESSION    . losartan (COZAAR) 100 MG tablet Take 1 tablet by mouth daily.    . metFORMIN (GLUCOPHAGE) 500 MG tablet Take 1,000 mg by mouth 2 (two) times daily with a meal.    . nitroGLYCERIN (NITROSTAT) 0.4 MG SL tablet Place 1 tablet (0.4 mg total) under the tongue every 5 (five) minutes as needed for chest  pain. 25 tablet 3  . PHENADOZ 25 MG suppository Place 25 mg rectally daily as needed (headaches).     . simvastatin (ZOCOR) 20 MG tablet Take 20 mg by mouth every evening.     . vitamin B-12 (CYANOCOBALAMIN) 1000 MCG tablet Take 1,000 mcg by mouth daily.    . metoprolol tartrate (LOPRESSOR) 25 MG tablet Take 1 tablet (25 mg total) by mouth 2 (two) times daily. 180 tablet 3  . doxycycline (VIBRA-TABS) 100 MG tablet Take 1 tablet (100 mg total) by mouth 2 (two) times daily. 14 tablet 0  . levothyroxine (SYNTHROID, LEVOTHROID) 200 MCG tablet Take 200 mcg by mouth daily before breakfast.    . losartan-hydrochlorothiazide (HYZAAR) 100-25 MG tablet Take 1 tablet by mouth daily.     Facility-Administered Medications Prior to Visit  Medication Dose Route Frequency Provider Last Rate Last Dose  . 0.9 %  sodium chloride infusion  500 mL Intravenous Continuous Milus Banister, MD         Allergies:   Patient has no known allergies.   Social History   Socioeconomic History  . Marital status: Married    Spouse name: Jeneen Rinks  . Number of children: 3  . Years of education: 44  . Highest education level: Not on file  Occupational History  . Occupation: retired  Scientific laboratory technician  . Financial resource strain: Not on file  . Food insecurity:    Worry: Not on file    Inability: Not on file  . Transportation needs:     Medical: Not on file    Non-medical: Not on file  Tobacco Use  . Smoking status: Former Smoker    Packs/day: 1.50    Years: 34.00    Pack years: 51.00    Types: Cigarettes    Last attempt to quit: 05/04/1997    Years since quitting: 20.2  . Smokeless tobacco: Never Used  Substance and Sexual Activity  . Alcohol use: No  . Drug use: No  . Sexual activity: Not on file  Lifestyle  . Physical activity:    Days per week: Not on file    Minutes per session: Not on file  . Stress: Not on file  Relationships  . Social connections:    Talks on phone: Not on file    Gets together: Not on file    Attends religious service: Not on file    Active member of club or organization: Not on file    Attends meetings of clubs or organizations: Not on file    Relationship status: Not on file  Other Topics Concern  . Not on file  Social History Narrative   Patient lives at home with her husband Jeneen Rinks .   Retired.   Education 12 th grade.   Right handed   Caffeine two cups of coffee.     Family History:  The patient's family history includes Diabetes in her maternal aunt and maternal uncle; Heart Problems in her father and mother; Heart defect in her mother; Heart disease in her brother; Lung cancer in her daughter; Throat cancer in her brother.   ROS:   Please see the history of present illness.   Review of Systems  All other systems reviewed and are negative.     PHYSICAL EXAM:   VS:  BP (!) 92/58   Pulse 67   Ht 5\' 5"  (1.651 m)   Wt 237 lb 6.4 oz (107.7 kg)   SpO2 97%   BMI 39.51  kg/m    GEN: Well nourished, well developed, in no acute distress, overweight HEENT: scar noted Neck: no JVD, carotid bruits, or masses Cardiac: RRR; no murmurs, rubs, or gallops,no edema, pacemaker on Left Respiratory:  clear to auscultation bilaterally, normal work of breathing GI: soft, nontender, nondistended, + BS MS: no deformity or atrophy  Skin: warm and dry, no rash, upper arm  varicosity Neuro:  Alert and Oriented x 3, Strength and sensation are intact Psych: euthymic mood, full affect    Wt Readings from Last 3 Encounters:  08/02/17 237 lb 6.4 oz (107.7 kg)  05/10/17 242 lb (109.8 kg)  02/26/17 245 lb 3.2 oz (111.2 kg)      Studies/Labs Reviewed:   EKG:   08/22/15-sinus bradycardia with first-degree AV block of 216 ms, no other abnormalities. Personally viewed.  Recent Labs: 02/15/2017: ALT 22; BUN 35; Creatinine, Ser 1.16; Hemoglobin 11.9; Platelets 441; Potassium 4.0; Sodium 138   Lipid Panel No results found for: CHOL, TRIG, HDL, CHOLHDL, VLDL, LDLCALC, LDLDIRECT  Additional studies/ records that were reviewed today include:  Former office notes, echocardiogram, carotid Dopplers reviewed  2015 ECHO: - Left ventricle: The cavity size was normal. Wall thickness was increased in a pattern of mild LVH. There was mild focal basal hypertrophy of the septum. Systolic function was normal. The estimated ejection fraction was in the range of 55% to 60%. Wall motion was normal; there were no regional wall motion abnormalities. Doppler parameters are consistent with abnormal left ventricular relaxation (grade 1 diastolic dysfunction). - Left atrium: The atrium was mildly dilated. - Atrial septum: There was increased thickness of the septum, consistent with lipomatous hypertrophy.   ASSESSMENT:    1. Sinus node dysfunction (HCC)   2. Cardiac pacemaker in situ   3. PVC (premature ventricular contraction)   4. Palpitations      PLAN:  In order of problems listed above:  Nonobstructive coronary artery disease -Continue with current medical management, optimize diabetes management, statin, blood pressure control. Overall she is having no anginal symptoms. Stress test in 2013 was low risk, reassuring. Normal ejection fraction.  Pacemaker-[employer] - Dr. Caryl Comes. Doing well. - Seems to be main active cardiology issue at this point.    - We will restart metoprolol 25 mg twice a day. This will help with her palpitations as well. She has a pacemaker for backup. It is like this at home.  Palpitations -PVCs/PACs seen on prior event monitor. Continue with metoprolol. No changes. Has pacemaker for backup.  No recent complaints.  Diabetes with peripheral neuropathy -Gabapentin.  Overall doing well.  Hemoglobin A1c 9.7 in November 2018.  Continue to strive for goal of less than 7.  Per primary team.  Morbid obesity -Continue to encourage weight loss. This will continue to help her overall.  This will also help with blood pressure generally.  Carotid artery disease -Carotid Dopplers from 08/2015 overall in the mild plaque range bilaterally. We will follow-up with carotid Dopplers on an as-needed basis at this point. I do not appreciate any significant bruits on exam. Prevention, diet, exercise, BP control, lipid control.  No changes.  Continue with aggressive secondary prevention.  Essential hypertension -Currently medications reviewed. -- was on metoprolol at one point.  This was stopped because of bradycardia.  She currently is on Norvasc 5 mg once a day, Lasix 20 mg once a day, HCTZ 50 mg once a day and losartan 100 mg once a day.  Blood pressure is low this morning.  This  is unusual for her.  I asked her to increase her fluids.  Chronic kidney disease stage III -Dr. Lisbeth Ply is watching creatinine closely especially with her spironolactone.  Last creatinine 1.2  COPD -Dr.Sood one-year follow-up.  States that her breathing has been fairly bad recently.  Since her main cardiac issue is pacemaker check and she has good primary care physician follow-up, I am comfortable with her seeing Dr. Caryl Comes on an annual basis for pacemaker monitoring.  Obviously if there are any current unrelated cardiovascular issues, we are happy to see her back.  Medication Adjustments/Labs and Tests Ordered: Current medicines are reviewed at length with  the patient today.  Concerns regarding medicines are outlined above.  Medication changes, Labs and Tests ordered today are listed in the Patient Instructions below. Patient Instructions  Medication Instructions:  The current medical regimen is effective;  continue present plan and medications.  Follow-Up: Follow up as needed with Dr. Marlou Porch.  Follow up with Dr Caryl Comes as scheduled.  If you need a refill on your cardiac medications before your next appointment, please call your pharmacy.  Thank you for choosing [facility]!!       Signed, Candee Furbish, MD  08/02/2017 8:50 AM    [facility] [place], [place], [place]  [address] Phone: [phone]; Fax: [phone]

## 2017-08-09 ENCOUNTER — Ambulatory Visit: Payer: Medicare HMO | Admitting: Pulmonary Disease

## 2017-08-09 ENCOUNTER — Encounter: Payer: Self-pay | Admitting: Pulmonary Disease

## 2017-08-09 VITALS — BP 128/70 | HR 76 | Ht 65.0 in | Wt 241.0 lb

## 2017-08-09 DIAGNOSIS — J449 Chronic obstructive pulmonary disease, unspecified: Secondary | ICD-10-CM | POA: Diagnosis not present

## 2017-08-09 DIAGNOSIS — R058 Other specified cough: Secondary | ICD-10-CM

## 2017-08-09 DIAGNOSIS — R05 Cough: Secondary | ICD-10-CM

## 2017-08-09 DIAGNOSIS — G4733 Obstructive sleep apnea (adult) (pediatric): Secondary | ICD-10-CM

## 2017-08-09 MED ORDER — ROFLUMILAST 250 MCG PO TABS: 250.0000 ug | ORAL_TABLET | Freq: Every day | ORAL | 0 refills | Status: DC

## 2017-08-09 MED ORDER — BUDESONIDE-FORMOTEROL FUMARATE 160-4.5 MCG/ACT IN AERO: INHALATION_SPRAY | RESPIRATORY_TRACT | 3 refills | Status: DC

## 2017-08-09 MED ORDER — ALBUTEROL SULFATE HFA 108 (90 BASE) MCG/ACT IN AERS: 2.0000 | INHALATION_SPRAY | Freq: Four times a day (QID) | RESPIRATORY_TRACT | 3 refills | Status: DC | PRN

## 2017-08-09 NOTE — Patient Instructions (Signed)
Daliresp 250 mcg pill daily for 1 month  Call after completing this to let us know if it is helping and you want a refill  Follow up in 6 to 8 weeks with Dr. Halford Chessman or Nurse Practitioner

## 2017-08-09 NOTE — Progress Notes (Signed)
[facility]  Chief Complaint  Patient presents with  . Follow-up    increased SOB x 1 week, wants to start back on prednisone, non-productive cough with yellow mucous. Needs refill on symbicort and ventolin.     Vital signs: BP 128/70 (BP Location: Left Arm, Cuff Size: Normal)   Pulse 76   Ht 5\' 5"  (1.651 m)   Wt 241 lb (109.3 kg)   SpO2 95%   BMI 40.10 kg/m   History of Present Illness: Kayla [name] is a 77 y.o. female COPD, OSA.  She has been using prednisone every few weeks.  She gets cough, wheeze, dyspnea, and chest congestion.  She feels better when using prednisone.  She is using symbicort bid.  She isn't having as much sinus congestion.  Denies sore throat.  Uses CPAP nightly.  Mask fits well.  She feels she might need pressure adjustment.  Physical Exam:  General - pleasant Eyes - pupils reactive ENT - no sinus tenderness, no oral exudate, no LAN Cardiac - regular, no murmur Chest - no wheeze, rales Abd - soft, non tender Ext - no edema Skin - no rashes Neuro - normal strength Psych - normal mood  Assessment/Plan:  COPD with chronic bronchitis. - she has frequent exacerbations requiring prednisone - will try her on daliresp >> start at 250 mcg daily and then she will call if she wants to continue - continue symbicort and prn ventolin  Obstructive sleep apnea. - she is compliant with CPAP - will get copy of her download and determine if she needs adjustment in her settings  Upper airway cough. - flonase, prn antihistamine  Obesity. - discussed importance of weight loss   Patient Instructions  Daliresp 250 mcg pill daily for 1 month  Call after completing this to let us know if it is helping and you want a refill  Follow up in 6 to 8 weeks with Dr. Halford Chessman or Nurse Practitioner    Chesley Mires, MD [place] 08/09/2017, 2:32 PM Pager:  [phone]  Flow Sheet  Sleep tests: PSG  02/2005 >> AHI 10  Auto CPAP 09/21/12 to 10/05/12 >> used on 15 of 15 nights with average 10 hrs 22 min.  Average AHI 1.8 with mean CPAP 5 cm H2O and 90 th percentile CPAP 6 cm H2O. CPAP titration 02/19/14 >> CPAP 11 cm H2O >> AHI 0.7, PLMI 81.4, did not need supplemental oxygen  Pulmonary tests Spirometry 10/19/08 >> FEV1 1.55 (68%), FEV1% 70  Spirometry 08/29/12 >> FEV1 1.38 (59%), FEV1% 58 FeNO 03/09/16 >> 7  Cardiac tests Echo 07/31/13 >> mild LVH, EF 55 to 60%, grade 1 DD  Past Medical History: She  has a past medical history of Allergy, Anxiety, Arthritis, CAD (coronary artery disease), Carotid artery disease ([place]), Cataract, Complication of anesthesia, COPD (chronic obstructive pulmonary disease) ([place]), Diabetes mellitus, type 2 ([place]), Diverticula of colon, Dizziness, Ejection fraction, GERD (gastroesophageal reflux disease), Goiter, Headache(784.0), Hyperlipidemia, Hypertension, Hypothyroidism, Left ventricular hypertrophy, Leg edema, Obesity, OSA (obstructive sleep apnea), PONV (postoperative nausea and vomiting), Pulmonary hypertension, secondary, and Sleep apnea.  Past Surgical History: She  has a past surgical history that includes Thyroidectomy (2011); Total knee arthroplasty (07/27/2011); Abdominal hysterectomy; Anterior cervical decomp/discectomy fusion (N/A, 11/07/2014); Cardiac catheterization (N/A, 11/08/2015); Lumbar disc surgery (1990's); Cholecystectomy (N/A, 04/29/2016); Upper esophageal endoscopic ultrasound (eus) (N/A, 05/14/2016); Cataract extraction w/ intraocular lens  implant, bilateral (Bilateral); Insert / replace / remove pacemaker (2017); Cardiac catheterization; Back surgery;  Appendectomy; Joint replacement; and Artery Biopsy (Right, 02/16/2017).  Family History: Her family history includes Diabetes in her maternal aunt and maternal uncle; Heart Problems in her father and mother; Heart defect in her mother; Heart disease in her brother; Lung cancer in her daughter; Throat  cancer in her brother.  Social History: She  reports that she quit smoking about 20 years ago. Her smoking use included cigarettes. She has a 51.00 pack-year smoking history. She has never used smokeless tobacco. She reports that she does not drink alcohol or use drugs.  Medications: Allergies as of 08/09/2017   No Known Allergies     Medication List        Accurate as of 08/09/17  2:32 PM. Always use your most recent med list.          acetaminophen 500 MG tablet Commonly known as:  TYLENOL Take 1,000 mg by mouth every 8 (eight) hours as needed for mild pain or moderate pain.   albuterol (2.5 MG/3ML) 0.083% nebulizer solution Commonly known as:  PROVENTIL Take 2.5 mg by nebulization every 6 (six) hours as needed for wheezing or shortness of breath.   albuterol 108 (90 Base) MCG/ACT inhaler Commonly known as:  PROVENTIL HFA;VENTOLIN HFA Inhale 2 puffs into the lungs every 6 (six) hours as needed for wheezing or shortness of breath.   albuterol 108 (90 Base) MCG/ACT inhaler Commonly known as:  VENTOLIN HFA Inhale 2 puffs into the lungs every 6 (six) hours as needed for wheezing or shortness of breath.   amLODipine 5 MG tablet Commonly known as:  NORVASC Take 5 mg by mouth daily.   aspirin 81 MG tablet Take 81 mg by mouth daily.   budesonide-formoterol 160-4.5 MCG/ACT inhaler Commonly known as:  SYMBICORT INHALE 2 PUFFS INTO THE LUNGS 2 TIMES DAILY.   chlorpheniramine 4 MG tablet Commonly known as:  CHLOR-TRIMETON Take 1 tablet (4 mg total) by mouth at bedtime.   D3-1000 PO Take 1,000 Units by mouth daily.   ferrous sulfate 325 (65 FE) MG tablet Take 325 mg by mouth 2 (two) times daily.   fluticasone 50 MCG/ACT nasal spray Commonly known as:  FLONASE USE 2 SPRAYS NASALLY DAILY   furosemide 20 MG tablet Commonly known as:  LASIX 20 mg daily.   gabapentin 600 MG tablet Commonly known as:  NEURONTIN Take 600 mg by mouth 3 (three) times daily.     hydrochlorothiazide 50 MG tablet Commonly known as:  HYDRODIURIL Take 50 mg by mouth daily.   HYDROcodone-acetaminophen 5-325 MG tablet Commonly known as:  NORCO/VICODIN Take 1-2 tablets by mouth every 6 (six) hours as needed for moderate pain.   insulin glargine 100 UNIT/ML injection Commonly known as:  LANTUS Inject 100 Units into the skin 2 (two) times daily.   levothyroxine 175 MCG tablet Commonly known as:  SYNTHROID, LEVOTHROID Take 1 tablet by mouth daily.   LORazepam 2 MG tablet Commonly known as:  ATIVAN Take 2 mg by mouth daily as needed for anxiety. FOR DEPRESSION   losartan 100 MG tablet Commonly known as:  COZAAR Take 1 tablet by mouth daily.   metFORMIN 500 MG tablet Commonly known as:  GLUCOPHAGE Take 1,000 mg by mouth 2 (two) times daily with a meal.   metoprolol tartrate 25 MG tablet Commonly known as:  LOPRESSOR Take 1 tablet (25 mg total) by mouth 2 (two) times daily.   nitroGLYCERIN 0.4 MG SL tablet Commonly known as:  NITROSTAT Place 1 tablet (0.4 mg total)  under the tongue every 5 (five) minutes as needed for chest pain.   PHENADOZ 25 MG suppository Generic drug:  promethazine Place 25 mg rectally daily as needed (headaches).   Roflumilast 250 MCG Tabs Commonly known as:  DALIRESP Take 250 mcg by mouth daily.   simvastatin 20 MG tablet Commonly known as:  ZOCOR Take 20 mg by mouth every evening.   vitamin B-12 1000 MCG tablet Commonly known as:  CYANOCOBALAMIN Take 1,000 mcg by mouth daily.

## 2017-08-23 ENCOUNTER — Ambulatory Visit: Payer: Medicare HMO | Admitting: Podiatry

## 2017-08-24 ENCOUNTER — Ambulatory Visit: Payer: Medicare HMO | Admitting: Podiatry

## 2017-08-28 ENCOUNTER — Telehealth: Payer: Self-pay | Admitting: Pulmonary Disease

## 2017-08-28 NOTE — Telephone Encounter (Signed)
Auto CPAP 07/14/17 to 08/12/17 >> used on 30 of 30 nights with average 9 hrs 41 min.  Average AHI 2.4 with mean CPAP 5 and 90 th percentile CPAP 6 cm H2O.   Please let her know that CPAP report shows good control of sleep apnea on current settings.

## 2017-08-30 NOTE — Telephone Encounter (Signed)
Attempt #1 to call patient, no VM set up to leave a message. Will call again later

## 2017-08-31 NOTE — Telephone Encounter (Signed)
Called and gave results from her CPAP results.  Patient stated understanding.  Patient wanted Dr Halford Chessman to know that she feels that she is unable to take Dalresp because it makes her feel nausea, back pain, headache, insomnia, and nervous feeling.  Her next appt is 10/04/17, and she feels that she may need to try another medication to help her breathing. I told her that I would make sure Dr Halford Chessman and Vida Roller are both aware of this.

## 2017-09-02 NOTE — Telephone Encounter (Signed)
VS please advise  Patient feels that she is unable to take Dalresp because it makes her feel nausea, back pain, headache, insomnia, and nervous feeling.  Her next appt is 10/04/17, and she feels that she may need to try another medication to help her breathing.

## 2017-09-03 ENCOUNTER — Ambulatory Visit: Payer: Medicare HMO | Admitting: Podiatry

## 2017-09-06 NOTE — Telephone Encounter (Signed)
Can try adding incruse one puff daily.

## 2017-09-06 NOTE — Telephone Encounter (Signed)
Left voice mail on machine for patient to return phone call back regarding results. X1  

## 2017-09-07 NOTE — Telephone Encounter (Signed)
Left voice mail on machine for patient to return phone call back regarding results. X2 

## 2017-09-08 MED ORDER — UMECLIDINIUM BROMIDE 62.5 MCG/INH IN AEPB: 1.0000 | INHALATION_SPRAY | Freq: Every day | RESPIRATORY_TRACT | 0 refills | Status: DC

## 2017-09-08 NOTE — Telephone Encounter (Signed)
Spoke with pt. She is aware of Dr. Juanetta Gosling recommendation. Rx has been sent in for only a 30 day supply per the pt's request. Nothing further was needed.

## 2017-09-09 ENCOUNTER — Telehealth: Payer: Self-pay | Admitting: Cardiology

## 2017-09-09 ENCOUNTER — Ambulatory Visit (INDEPENDENT_AMBULATORY_CARE_PROVIDER_SITE_OTHER): Payer: Medicare HMO | Admitting: *Deleted

## 2017-09-09 DIAGNOSIS — I495 Sick sinus syndrome: Secondary | ICD-10-CM

## 2017-09-09 DIAGNOSIS — N10 Acute tubulo-interstitial nephritis: Secondary | ICD-10-CM | POA: Diagnosis not present

## 2017-09-09 DIAGNOSIS — R1013 Epigastric pain: Secondary | ICD-10-CM | POA: Diagnosis not present

## 2017-09-09 NOTE — Telephone Encounter (Signed)
LMOVM reminding pt to send remote transmission.   

## 2017-09-13 NOTE — Progress Notes (Signed)
Remote pacemaker transmission.   

## 2017-09-14 ENCOUNTER — Encounter: Payer: Self-pay | Admitting: Cardiology

## 2017-09-15 DIAGNOSIS — G4733 Obstructive sleep apnea (adult) (pediatric): Secondary | ICD-10-CM | POA: Diagnosis not present

## 2017-09-15 DIAGNOSIS — E890 Postprocedural hypothyroidism: Secondary | ICD-10-CM | POA: Diagnosis not present

## 2017-09-15 DIAGNOSIS — N10 Acute tubulo-interstitial nephritis: Secondary | ICD-10-CM | POA: Diagnosis not present

## 2017-09-15 DIAGNOSIS — I10 Essential (primary) hypertension: Secondary | ICD-10-CM | POA: Diagnosis not present

## 2017-09-15 DIAGNOSIS — E1121 Type 2 diabetes mellitus with diabetic nephropathy: Secondary | ICD-10-CM | POA: Diagnosis not present

## 2017-09-15 DIAGNOSIS — Z6838 Body mass index (BMI) 38.0-38.9, adult: Secondary | ICD-10-CM | POA: Diagnosis not present

## 2017-09-15 DIAGNOSIS — E1149 Type 2 diabetes mellitus with other diabetic neurological complication: Secondary | ICD-10-CM | POA: Diagnosis not present

## 2017-09-17 ENCOUNTER — Ambulatory Visit: Payer: Medicare HMO | Admitting: Podiatry

## 2017-10-01 ENCOUNTER — Encounter: Payer: Self-pay | Admitting: Podiatry

## 2017-10-01 ENCOUNTER — Ambulatory Visit (INDEPENDENT_AMBULATORY_CARE_PROVIDER_SITE_OTHER): Payer: Medicare HMO | Admitting: Podiatry

## 2017-10-01 DIAGNOSIS — L84 Corns and callosities: Secondary | ICD-10-CM | POA: Diagnosis not present

## 2017-10-01 DIAGNOSIS — E1142 Type 2 diabetes mellitus with diabetic polyneuropathy: Secondary | ICD-10-CM | POA: Diagnosis not present

## 2017-10-01 DIAGNOSIS — B351 Tinea unguium: Secondary | ICD-10-CM | POA: Diagnosis not present

## 2017-10-02 NOTE — Progress Notes (Signed)
Subjective: 77 year old female presents today with diabetes, diabetic neuropathy and cc of painful, discolored, thick toenails which interfere with activities of daily living and performance of routine tasks. She also has a chronic, preulcerative callus left great toe from childhood injury which requires periodic debridement.   Pain is aggravated when wearing enclosed shoe gear. Pain relieved with periodic professional debridement.  Objective: Vascular Examination: Capillary refill time <3 seconds x 10 digits Dorsalis pedis 2/4 b/l Posterior tibial pulses nonpalpable b/l Edema BLE No digital hair x 10 digits Skin temperature warm to cool b/l  Dermatological Examination: Skin thin, shiny and atrophic b/l Toenails 1-5 b/l discolored, thick, dystrophic with subungual debris and pain with palpation to nailbeds due to thickness of nails. Hyperkeratotic lesion noted submetatarsal head  Musculoskeletal: Muscle strength 5/5 to all LE muscle groups  Neurological: Sensation slightly decreased with 10 gram monofilament. Vibratory sensation diminished  Assessment: 1. Painful onychomycosis toenails 1-5 b/l 2. Callus left medial hallux 3. NIDDM with Diabetic neuropathy  Plan: 1. Continue diabetic foot care principles  2. Toenails 1-5 b/l were debrided in length and girth without iatrogenic bleeding. 3. Hyperkeratotic lesion pared left great toe with sterile chisel blade and gently smoothed with burr. 4. Patient to continue soft, supportive shoe gear 5. Patient to report any pedal injuries to medical professional  6.   Follow up 3 months. 7.  Patient/POA to call should there be a concern in the interim.

## 2017-10-04 ENCOUNTER — Ambulatory Visit: Payer: Medicare HMO | Admitting: Pulmonary Disease

## 2017-10-05 LAB — CUP PACEART REMOTE DEVICE CHECK
Battery Remaining Longevity: 106 mo
Battery Voltage: 3.02 V
Brady Statistic AP VP Percent: 0.04 %
Brady Statistic AP VS Percent: 47.65 %
Brady Statistic AS VP Percent: 0.03 %
Brady Statistic AS VS Percent: 52.28 %
Brady Statistic RA Percent Paced: 47.64 %
Brady Statistic RV Percent Paced: 0.08 %
Date Time Interrogation Session: 20190510115959
Implantable Lead Implant Date: 20170707
Implantable Lead Implant Date: 20170707
Implantable Lead Location: 753859
Implantable Lead Location: 753860
Implantable Lead Model: 3830
Implantable Lead Model: 5076
Implantable Pulse Generator Implant Date: 20170707
Lead Channel Impedance Value: 304 Ohm
Lead Channel Impedance Value: 361 Ohm
Lead Channel Impedance Value: 399 Ohm
Lead Channel Impedance Value: 399 Ohm
Lead Channel Pacing Threshold Amplitude: 0.875 V
Lead Channel Pacing Threshold Amplitude: 1.875 V
Lead Channel Pacing Threshold Pulse Width: 0.4 ms
Lead Channel Pacing Threshold Pulse Width: 0.4 ms
Lead Channel Sensing Intrinsic Amplitude: 6.625 mV
Lead Channel Sensing Intrinsic Amplitude: 6.625 mV
Lead Channel Sensing Intrinsic Amplitude: 6.75 mV
Lead Channel Sensing Intrinsic Amplitude: 6.75 mV
Lead Channel Setting Pacing Amplitude: 2 V
Lead Channel Setting Pacing Amplitude: 2.5 V
Lead Channel Setting Pacing Pulse Width: 1 ms
Lead Channel Setting Sensing Sensitivity: 2.8 mV

## 2017-10-06 ENCOUNTER — Ambulatory Visit: Payer: Medicare HMO | Admitting: Pulmonary Disease

## 2017-10-07 ENCOUNTER — Ambulatory Visit (INDEPENDENT_AMBULATORY_CARE_PROVIDER_SITE_OTHER)
Admission: RE | Admit: 2017-10-07 | Discharge: 2017-10-07 | Disposition: A | Discharge status: Home / Self Care | Payer: Medicare HMO | Source: Ambulatory Visit | Attending: Adult Health | Admitting: Adult Health

## 2017-10-07 ENCOUNTER — Ambulatory Visit: Payer: Medicare HMO | Admitting: Adult Health

## 2017-10-07 ENCOUNTER — Encounter: Payer: Self-pay | Admitting: Adult Health

## 2017-10-07 VITALS — BP 140/64 | HR 83 | Ht 65.0 in | Wt 240.8 lb

## 2017-10-07 DIAGNOSIS — J441 Chronic obstructive pulmonary disease with (acute) exacerbation: Secondary | ICD-10-CM

## 2017-10-07 DIAGNOSIS — R05 Cough: Secondary | ICD-10-CM | POA: Diagnosis not present

## 2017-10-07 MED ORDER — PREDNISONE 10 MG PO TABS: ORAL_TABLET | ORAL | 0 refills | Status: DC

## 2017-10-07 MED ORDER — UMECLIDINIUM BROMIDE 62.5 MCG/INH IN AEPB: 1.0000 | INHALATION_SPRAY | Freq: Every day | RESPIRATORY_TRACT | 0 refills | Status: DC

## 2017-10-07 MED ORDER — DOXYCYCLINE HYCLATE 100 MG PO TABS: 100.0000 mg | ORAL_TABLET | Freq: Two times a day (BID) | ORAL | 0 refills | Status: DC

## 2017-10-07 MED ORDER — UMECLIDINIUM BROMIDE 62.5 MCG/INH IN AEPB: 1.0000 | INHALATION_SPRAY | Freq: Every day | RESPIRATORY_TRACT | 1 refills | Status: DC

## 2017-10-07 NOTE — Patient Instructions (Addendum)
Doxycycline 100mg  Twice daily  For 7 days , take with food.  Prednisone taper over next week.  Use sliding scale if Blood sugar goes up .  Mucinex DM Twice daily As needed  Cough /congestion  Chest xray today .  Continue on Symbicort 2 puffs Twice daily   Continue on INCRUSE 1 puff daily.  Follow up with Dr. Halford Chessman  In 2-3 months and As needed   Please contact office for sooner follow up if symptoms do not improve or worsen or seek emergency care

## 2017-10-07 NOTE — Addendum Note (Signed)
Addended by: Parke Poisson E on: 10/07/2017 04:54 PM   Modules accepted: Orders

## 2017-10-07 NOTE — Assessment & Plan Note (Signed)
Exacerbation Check cxr  Patient education on steroids as patient has diabetes.  Plan  Patient Instructions  Doxycycline 100mg  Twice daily  For 7 days , take with food.  Prednisone taper over next week.  Use sliding scale if Blood sugar goes up .  Mucinex DM Twice daily As needed  Cough /congestion  Chest xray today .  Continue on Symbicort 2 puffs Twice daily   Continue on INCRUSE 1 puff daily.  Follow up with Dr. Halford Chessman  In 2-3 months and As needed   Please contact office for sooner follow up if symptoms do not improve or worsen or seek emergency care

## 2017-10-07 NOTE — Progress Notes (Signed)
@Patient  ID: Kayla [name], female    DOB: 04-13-1941, 77 y.o.   MRN: [id number]  Chief Complaint  Patient presents with  . Follow-up    COPD    Referring provider: Alonna Buckler*  HPI: 77 year old female followed for COPD and obstructive sleep apnea  TEST  Sleep tests: PSG 02/2005 >>AHI 10  Auto CPAP 09/21/12 to 10/05/12 >>used on 15 of 15 nights with average 10 hrs 22 min. Average AHI 1.8 with mean CPAP 5 cm H2O and 90 th percentile CPAP 6 cm H2O. CPAP titration 02/19/14 >> CPAP 11 cm H2O >> AHI 0.7, PLMI 81.4, did not need supplemental oxygen  Pulmonary tests Spirometry 10/19/08 >>FEV1 1.55 (68%), FEV1% 70  Spirometry 08/29/12 >> FEV1 1.38 (59%), FEV1% 58 FeNO 03/09/16 >> 7 2017 FEV1 48%.   Cardiac tests Echo 07/31/13 >> mild LVH, EF 55 to 60%, grade 1 DD   10/07/2017 Follow up : COPD  Presents for a 57-month follow-up.  Last visit patient was having increased COPD symptoms and frequent exacerbations..  She was started on Daliresp. Daliresp was added last ov but was unable to tolerate due to GI side effects.  She was then tried on Incruse but says it did not make any difference in her symptoms.  Now with increased cough congestion and thick mucus with pain in ribs with coughing for last 2 weeks.  He denies any hemoptysis, chest pain, orthopnea, edema.   No Known Allergies  Immunization History  Administered Date(s) Administered  . Influenza Split 03/05/2011, 01/08/2017  . Influenza Whole 03/18/2009, 02/03/2010, 04/03/2012  . Influenza, High Dose Seasonal PF 02/04/2016  . Influenza,inj,Quad PF,6+ Mos 02/10/2013, 03/04/2014, 03/17/2015  . Pneumococcal Polysaccharide-23 02/03/2010  . Tdap 03/04/2014  . Zoster 10/02/2013    Past Medical History:  Diagnosis Date  . Allergy   . Anxiety   . Arthritis   . CAD (coronary artery disease)    Mild per remote cath in 2006, /    Nuclear, January, 2013, no significant abnormality,  . Carotid artery disease  ([place])    Doppler, January, 2013, 0 58% RIC A., 09-98% LICA, stable  . Cataract   . Complication of anesthesia    wakes up "mean"  . COPD (chronic obstructive pulmonary disease) ([place])   . Diabetes mellitus, type 2 ([place])   . Diverticula of colon   . Dizziness    occasional  . Ejection fraction   . GERD (gastroesophageal reflux disease)   . Goiter    hx of  . Headache(784.0)    migraine  . Hyperlipidemia   . Hypertension   . Hypothyroidism   . Left ventricular hypertrophy   . Leg edema    occasional swelling  . Obesity   . OSA (obstructive sleep apnea)    cpap setting of 12  . PONV (postoperative nausea and vomiting)    severe after every surgery  . Pulmonary hypertension, secondary   . Sleep apnea     Tobacco History: Social History   Tobacco Use  Smoking Status Former Smoker  . Packs/day: 1.50  . Years: 34.00  . Pack years: 51.00  . Types: Cigarettes  . Last attempt to quit: 05/04/1997  . Years since quitting: 20.4  Smokeless Tobacco Never Used   Counseling given: Not Answered   Outpatient Encounter Medications as of 10/07/2017  Medication Sig  . acetaminophen (TYLENOL) 500 MG tablet Take 1,000 mg by mouth every 8 (eight) hours as needed for mild pain or moderate pain.  Marland Kitchen  albuterol (PROVENTIL) (2.5 MG/3ML) 0.083% nebulizer solution Take 2.5 mg by nebulization every 6 (six) hours as needed for wheezing or shortness of breath.  Marland Kitchen albuterol (VENTOLIN HFA) 108 (90 Base) MCG/ACT inhaler Inhale 2 puffs into the lungs every 6 (six) hours as needed for wheezing or shortness of breath.  Marland Kitchen amLODipine (NORVASC) 5 MG tablet Take 5 mg by mouth daily.   Marland Kitchen aspirin 81 MG tablet Take 81 mg by mouth daily.  . budesonide-formoterol (SYMBICORT) 160-4.5 MCG/ACT inhaler INHALE 2 PUFFS INTO THE LUNGS 2 TIMES DAILY.  . chlorpheniramine (CHLOR-TRIMETON) 4 MG tablet Take 1 tablet (4 mg total) by mouth at bedtime.  . Cholecalciferol (D3-1000 PO) Take 1,000 Units by mouth daily.   .  ciprofloxacin (CIPRO) 500 MG tablet Take 500 mg by mouth 2 (two) times daily.  . ferrous sulfate 325 (65 FE) MG tablet Take 325 mg by mouth 2 (two) times daily.  . fluticasone (FLONASE) 50 MCG/ACT nasal spray USE 2 SPRAYS NASALLY DAILY  . furosemide (LASIX) 20 MG tablet 20 mg daily.   Marland Kitchen gabapentin (NEURONTIN) 800 MG tablet   . hydrochlorothiazide (HYDRODIURIL) 50 MG tablet Take 50 mg by mouth daily.  Marland Kitchen HYDROcodone-acetaminophen (NORCO/VICODIN) 5-325 MG tablet Take 1-2 tablets by mouth every 6 (six) hours as needed for moderate pain. (Patient taking differently: Take 1-2 tablets by mouth every 4 (four) hours as needed for moderate pain. )  . insulin glargine (LANTUS) 100 UNIT/ML injection Inject 100 Units into the skin 2 (two) times daily.   Marland Kitchen levothyroxine (SYNTHROID, LEVOTHROID) 175 MCG tablet Take 1 tablet by mouth daily.  Marland Kitchen LORazepam (ATIVAN) 2 MG tablet Take 2 mg by mouth daily as needed for anxiety. FOR DEPRESSION  . losartan (COZAAR) 100 MG tablet Take 1 tablet by mouth daily.  . metFORMIN (GLUCOPHAGE) 500 MG tablet Take 1,000 mg by mouth 2 (two) times daily with a meal.  . nitroGLYCERIN (NITROSTAT) 0.4 MG SL tablet Place 1 tablet (0.4 mg total) under the tongue every 5 (five) minutes as needed for chest pain.  Marland Kitchen NOVOLOG FLEXPEN 100 UNIT/ML FlexPen   . PHENADOZ 25 MG suppository Place 25 mg rectally daily as needed (headaches).   . simvastatin (ZOCOR) 20 MG tablet Take 20 mg by mouth every evening.   . TRUE METRIX BLOOD GLUCOSE TEST test strip   . vitamin B-12 (CYANOCOBALAMIN) 1000 MCG tablet Take 1,000 mcg by mouth daily.  Marland Kitchen doxycycline (VIBRA-TABS) 100 MG tablet Take 1 tablet (100 mg total) by mouth 2 (two) times daily.  . metoprolol tartrate (LOPRESSOR) 25 MG tablet Take 1 tablet (25 mg total) by mouth 2 (two) times daily.  . predniSONE (DELTASONE) 10 MG tablet 4 tabs for 2 days, then 3 tabs for 2 days, 2 tabs for 2 days, then 1 tab for 2 days, then stop  . Roflumilast (DALIRESP) 250  MCG TABS Take 250 mcg by mouth daily. (Patient not taking: Reported on 10/07/2017)  . umeclidinium bromide (INCRUSE ELLIPTA) 62.5 MCG/INH AEPB Inhale 1 puff into the lungs daily. (Patient not taking: Reported on 10/07/2017)  . [DISCONTINUED] albuterol (PROVENTIL HFA;VENTOLIN HFA) 108 (90 Base) MCG/ACT inhaler Inhale 2 puffs into the lungs every 6 (six) hours as needed for wheezing or shortness of breath.   Facility-Administered Encounter Medications as of 10/07/2017  Medication  . 0.9 %  sodium chloride infusion     Review of Systems  Constitutional:   No  weight loss, night sweats,  Fevers, chills, fatigue, or  lassitude.  HEENT:  No headaches,  Difficulty swallowing,  Tooth/dental problems, or  Sore throat,                No sneezing, itching, ear ache, + nasal congestion, post nasal drip,   CV:  No chest pain,  Orthopnea, PND, swelling in lower extremities, anasarca, dizziness, palpitations, syncope.   GI  No heartburn, indigestion, abdominal pain, nausea, vomiting, diarrhea, change in bowel habits, loss of appetite, bloody stools.   Resp: .  No chest wall deformity  Skin: no rash or lesions.  GU: no dysuria, change in color of urine, no urgency or frequency.  No flank pain, no hematuria   MS:  No joint pain or swelling.  No decreased range of motion.  No back pain.    Physical Exam  BP 140/64 (BP Location: Left Arm, Cuff Size: Large)   Pulse 83   Ht 5\' 5"  (1.651 m)   Wt 240 lb 12.8 oz (109.2 kg)   SpO2 97%   BMI 40.07 kg/m   GEN: A/Ox3; pleasant , NAD, obese    HEENT:  [place]/AT,  EACs-clear, TMs-wnl, NOSE-clear, THROAT-clear, no lesions, no postnasal drip or exudate noted.   NECK:  Supple w/ fair ROM; no JVD; normal carotid impulses w/o bruits; no thyromegaly or nodules palpated; no lymphadenopathy.    RESP trace rhonchi . no accessory muscle use, no dullness to percussion  CARD:  RRR, no m/r/g, no peripheral edema, pulses intact, no cyanosis or clubbing.  GI:   Soft &  nt; nml bowel sounds; no organomegaly or masses detected.   Musco: Warm bil, no deformities or joint swelling noted.   Neuro: alert, no focal deficits noted.    Skin: Warm, no lesions or rashes    Lab Results:  CBC  BNP No results found for: BNP  ProBNP  Imaging: No results found.   Assessment & Plan:   COPD Exacerbation Check cxr  Patient education on steroids as patient has diabetes.  Plan  Patient Instructions  Doxycycline 100mg  Twice daily  For 7 days , take with food.  Prednisone taper over next week.  Use sliding scale if Blood sugar goes up .  Mucinex DM Twice daily As needed  Cough /congestion  Chest xray today .  Continue on Symbicort 2 puffs Twice daily   Continue on INCRUSE 1 puff daily.  Follow up with Dr. Halford Chessman  In 2-3 months and As needed   Please contact office for sooner follow up if symptoms do not improve or worsen or seek emergency care          Rexene Edison, NP 10/07/2017

## 2017-10-07 NOTE — Progress Notes (Signed)
Reviewed and agree with assessment/plan.   Dessire Grimes, MD [facility] 04/29/2016, 12:24 PM Pager:  [phone]  

## 2017-10-08 ENCOUNTER — Encounter: Payer: Self-pay | Admitting: Podiatry

## 2017-10-27 DIAGNOSIS — G43119 Migraine with aura, intractable, without status migrainosus: Secondary | ICD-10-CM | POA: Diagnosis not present

## 2017-10-30 ENCOUNTER — Other Ambulatory Visit: Payer: Self-pay

## 2017-10-30 ENCOUNTER — Emergency Department (HOSPITAL_COMMUNITY)
Admission: EM | Admit: 2017-10-30 | Discharge: 2017-10-31 | Disposition: A | Discharge status: Home / Self Care | Payer: Medicare HMO | Attending: Emergency Medicine | Admitting: Emergency Medicine

## 2017-10-30 ENCOUNTER — Emergency Department (HOSPITAL_COMMUNITY): Payer: Medicare HMO

## 2017-10-30 DIAGNOSIS — E785 Hyperlipidemia, unspecified: Secondary | ICD-10-CM | POA: Insufficient documentation

## 2017-10-30 DIAGNOSIS — Z87891 Personal history of nicotine dependence: Secondary | ICD-10-CM | POA: Diagnosis not present

## 2017-10-30 DIAGNOSIS — E039 Hypothyroidism, unspecified: Secondary | ICD-10-CM | POA: Diagnosis not present

## 2017-10-30 DIAGNOSIS — R519 Headache, unspecified: Secondary | ICD-10-CM

## 2017-10-30 DIAGNOSIS — M542 Cervicalgia: Secondary | ICD-10-CM | POA: Diagnosis not present

## 2017-10-30 DIAGNOSIS — J449 Chronic obstructive pulmonary disease, unspecified: Secondary | ICD-10-CM | POA: Insufficient documentation

## 2017-10-30 DIAGNOSIS — I2510 Atherosclerotic heart disease of native coronary artery without angina pectoris: Secondary | ICD-10-CM | POA: Diagnosis not present

## 2017-10-30 DIAGNOSIS — I10 Essential (primary) hypertension: Secondary | ICD-10-CM | POA: Diagnosis not present

## 2017-10-30 DIAGNOSIS — Z794 Long term (current) use of insulin: Secondary | ICD-10-CM | POA: Diagnosis not present

## 2017-10-30 DIAGNOSIS — G40909 Epilepsy, unspecified, not intractable, without status epilepticus: Secondary | ICD-10-CM | POA: Diagnosis not present

## 2017-10-30 DIAGNOSIS — E119 Type 2 diabetes mellitus without complications: Secondary | ICD-10-CM | POA: Diagnosis not present

## 2017-10-30 DIAGNOSIS — R51 Headache: Secondary | ICD-10-CM | POA: Insufficient documentation

## 2017-10-30 DIAGNOSIS — Z79899 Other long term (current) drug therapy: Secondary | ICD-10-CM | POA: Diagnosis not present

## 2017-10-30 DIAGNOSIS — Z7982 Long term (current) use of aspirin: Secondary | ICD-10-CM | POA: Diagnosis not present

## 2017-10-30 MED ORDER — MORPHINE SULFATE (PF) 4 MG/ML IV SOLN
4.0000 mg | Freq: Once | INTRAVENOUS | Status: AC
  Administered 2017-10-30: 4 mg via INTRAVENOUS
  Filled 2017-10-30: qty 1

## 2017-10-30 MED ORDER — METHOCARBAMOL 500 MG PO TABS: 500.0000 mg | ORAL_TABLET | Freq: Two times a day (BID) | ORAL | 0 refills | Status: DC

## 2017-10-30 MED ORDER — OXYCODONE-ACETAMINOPHEN 5-325 MG PO TABS
1.0000 | ORAL_TABLET | Freq: Once | ORAL | Status: AC
  Administered 2017-10-31: 1 via ORAL
  Filled 2017-10-30: qty 1

## 2017-10-30 MED ORDER — LIDOCAINE 5 % EX PTCH
1.0000 | MEDICATED_PATCH | CUTANEOUS | Status: DC
  Administered 2017-10-30: 1 via TRANSDERMAL
  Filled 2017-10-30: qty 1

## 2017-10-30 MED ORDER — ACETAMINOPHEN 325 MG PO TABS
650.0000 mg | ORAL_TABLET | Freq: Once | ORAL | Status: AC
  Administered 2017-10-30: 650 mg via ORAL
  Filled 2017-10-30: qty 2

## 2017-10-30 MED ORDER — OXYCODONE-ACETAMINOPHEN 5-325 MG PO TABS: 1.0000 | ORAL_TABLET | ORAL | 0 refills | Status: DC | PRN

## 2017-10-30 NOTE — ED Provider Notes (Signed)
[facility] EMERGENCY DEPARTMENT Provider Note   CSN: [id number] Arrival date & time: 10/30/17  1955     History   Chief Complaint Chief Complaint  Patient presents with  . Headache    HPI Kayla [name] is a 77 y.o. female who presents with headache and neck pain. PMH significant for CAD, headaches, COPD, Type 2 DM, GERD, HTN, HLD, degenerative disc disease of the lumbar and cervical spine. She states that her headache started 5 days ago. It was gradual in onset and is constant. The pain is over the right posterior. She also has right sided neck pain which radiates to the right shoulder and occassionally has a throbbing pain in the right arm and hands with associated swelling. She takes Hydrocodone chronically for her back and it helps but seems to come right back. She also notes that he headache is worse when she exerts herself such as when she walks around the house. She has had intermittent dizziness but no syncope, head/neck trauma, unilateral weakness, vision changes, numbness or tingling. She denies fever, neck stiffness, acute onset, worst headache of life. This is different from her previous headaches. She is s/p ACDF of C5-C7 by Dr. Saintclair Halsted in 2016. She went to her primary doctor on Thursday and received a shot (she doesn't know what it was) and was prescribed Imitrex which she took without relief. She was advised to come to the ED if her symptoms persisted.   HPI  Past Medical History:  Diagnosis Date  . Allergy   . Anxiety   . Arthritis   . CAD (coronary artery disease)    Mild per remote cath in 2006, /    Nuclear, January, 2013, no significant abnormality,  . Carotid artery disease ([place])    Doppler, January, 2013, 0 13% RIC A., 24-40% LICA, stable  . Cataract   . Complication of anesthesia    wakes up "mean"  . COPD (chronic obstructive pulmonary disease) ([place])   . Diabetes mellitus, type 2 ([place])   . Diverticula of colon   . Dizziness    occasional   . Ejection fraction   . GERD (gastroesophageal reflux disease)   . Goiter    hx of  . Headache(784.0)    migraine  . Hyperlipidemia   . Hypertension   . Hypothyroidism   . Left ventricular hypertrophy   . Leg edema    occasional swelling  . Obesity   . OSA (obstructive sleep apnea)    cpap setting of 12  . PONV (postoperative nausea and vomiting)    severe after every surgery  . Pulmonary hypertension, secondary   . Sleep apnea     Patient Active Problem List   Diagnosis Date Noted  . RUQ abdominal pain   . Gastritis and gastroduodenitis   . Bile duct abnormality   . Cholecystitis 04/30/2016  . Gall bladder disease 04/28/2016  . Sinus node dysfunction ([place]) 11/08/2015  . Ejection fraction   . Spinal stenosis of cervical region 11/07/2014  . Preoperative clearance 10/09/2014  . Carotid artery disease ([place])   . S/P left TKA 07/27/2011  . Dizzy spells 05/04/2011  . Palpitations 01/21/2011  . RHINITIS 07/21/2010  . Hyperlipidemia 11/24/2008  . OBESITY 11/24/2008  . Coronary atherosclerosis 11/24/2008  . VENTRICULAR HYPERTROPHY, LEFT 11/24/2008  . HYPOTHYROIDISM, POSTSURGICAL 06/04/2008  . Headache 06/04/2008  . DM (diabetes mellitus) ([place]) 03/23/2007  . G E R D 03/23/2007  . DIVERTICULOSIS OF COLON 03/23/2007  . GENERALIZED  OSTEOARTHROSIS UNSPECIFIED SITE 03/23/2007  . OBSTRUCTIVE SLEEP APNEA 02/16/2007  . Essential hypertension 02/16/2007  . COPD 02/16/2007    Past Surgical History:  Procedure Laterality Date  . ABDOMINAL HYSTERECTOMY     with appendectomy and colon polypectomy  . ANTERIOR CERVICAL DECOMP/DISCECTOMY FUSION N/A 11/07/2014   Procedure: Anterior Cervical diskectomy and fusion Cervical four-five, Cervical five-six, Cervical six-seven;  Surgeon: Kary Kos, MD;  Location: [facility];  Service: Neurosurgery;  Laterality: N/A;  . APPENDECTOMY    . ARTERY BIOPSY Right 02/16/2017   Procedure: BIOPSY TEMPORAL ARTERY;  Surgeon: Katha Cabal, MD;   Location: [facility];  Service: Vascular;  Laterality: Right;  . BACK SURGERY    . CARDIAC CATHETERIZATION    . CATARACT EXTRACTION W/ INTRAOCULAR LENS  IMPLANT, BILATERAL Bilateral   . CHOLECYSTECTOMY N/A 04/29/2016   Procedure: LAPAROSCOPIC CHOLECYSTECTOMY WITH INTRAOPERATIVE CHOLANGIOGRAM;  Surgeon: Alphonsa Overall, MD;  Location: [facility];  Service: General;  Laterality: N/A;  . EP IMPLANTABLE DEVICE N/A 11/08/2015   Procedure: Pacemaker Implant;  Surgeon: Deboraha Sprang, MD;  Location: [facility];  Service: Cardiovascular;  Laterality: N/A;  . INSERT / REPLACE / REMOVE PACEMAKER  2017   Dr. Jens Som [facility])  . JOINT REPLACEMENT    . LUMBAR DISC SURGERY  1990's   x 2  . THYROIDECTOMY  2011  . TOTAL KNEE ARTHROPLASTY  07/27/2011   Procedure: TOTAL KNEE ARTHROPLASTY;  Surgeon: Mauri Pole, MD;  Location: [facility];  Service: Orthopedics;  Laterality: Left;  . UPPER ESOPHAGEAL ENDOSCOPIC ULTRASOUND (EUS) N/A 05/14/2016   Procedure: UPPER ESOPHAGEAL ENDOSCOPIC ULTRASOUND (EUS);  Surgeon: Milus Banister, MD;  Location: [facility];  Service: Endoscopy;  Laterality: N/A;  radial only-will be done mod if needed     OB History   None      Home Medications    Prior to Admission medications   Medication Sig Start Date End Date Taking? Authorizing Provider  acetaminophen (TYLENOL) 500 MG tablet Take 1,000 mg by mouth every 8 (eight) hours as needed for mild pain or moderate pain.    [provider]  albuterol (PROVENTIL) (2.5 MG/3ML) 0.083% nebulizer solution Take 2.5 mg by nebulization every 6 (six) hours as needed for wheezing or shortness of breath.    [provider]  albuterol (VENTOLIN HFA) 108 (90 Base) MCG/ACT inhaler Inhale 2 puffs into the lungs every 6 (six) hours as needed for wheezing or shortness of breath. 08/09/17   Chesley Mires, MD  amLODipine (NORVASC) 5 MG tablet Take 5 mg by mouth daily.  08/13/11   [provider]  aspirin 81 MG tablet Take 81  mg by mouth daily.    [provider]  budesonide-formoterol (SYMBICORT) 160-4.5 MCG/ACT inhaler INHALE 2 PUFFS INTO THE LUNGS 2 TIMES DAILY. 08/09/17   Chesley Mires, MD  chlorpheniramine (CHLOR-TRIMETON) 4 MG tablet Take 1 tablet (4 mg total) by mouth at bedtime. 05/10/17   Chesley Mires, MD  Cholecalciferol (D3-1000 PO) Take 1,000 Units by mouth daily.     [provider]  ciprofloxacin (CIPRO) 500 MG tablet Take 500 mg by mouth 2 (two) times daily. 09/09/17   [provider]  doxycycline (VIBRA-TABS) 100 MG tablet Take 1 tablet (100 mg total) by mouth 2 (two) times daily. 10/07/17   Parrett, Fonnie Mu, NP  ferrous sulfate 325 (65 FE) MG tablet Take 325 mg by mouth 2 (two) times daily. 01/20/17   [provider]  fluticasone (FLONASE) 50  MCG/ACT nasal spray USE 2 SPRAYS NASALLY DAILY 02/26/17   Collene Gobble, MD  furosemide (LASIX) 20 MG tablet 20 mg daily.  01/02/17   [provider]  gabapentin (NEURONTIN) 800 MG tablet  09/20/17   [provider]  hydrochlorothiazide (HYDRODIURIL) 50 MG tablet Take 50 mg by mouth daily. 06/01/17   [provider]  HYDROcodone-acetaminophen (NORCO/VICODIN) 5-325 MG tablet Take 1-2 tablets by mouth every 6 (six) hours as needed for moderate pain. Patient taking differently: Take 1-2 tablets by mouth every 4 (four) hours as needed for moderate pain.  04/30/16   Meuth, Brooke A, PA-C  insulin glargine (LANTUS) 100 UNIT/ML injection Inject 100 Units into the skin 2 (two) times daily.     [provider]  levothyroxine (SYNTHROID, LEVOTHROID) 175 MCG tablet Take 1 tablet by mouth daily. 06/24/17   [provider]  LORazepam (ATIVAN) 2 MG tablet Take 2 mg by mouth daily as needed for anxiety. FOR DEPRESSION 08/20/14   [provider]  losartan (COZAAR) 100 MG tablet Take 1 tablet by mouth daily. 06/21/17   [provider]  metFORMIN (GLUCOPHAGE) 500 MG tablet Take 1,000 mg by mouth 2  (two) times daily with a meal.    [provider]  metoprolol tartrate (LOPRESSOR) 25 MG tablet Take 1 tablet (25 mg total) by mouth 2 (two) times daily. 12/08/16 03/08/17  Baldwin Jamaica, PA-C  nitroGLYCERIN (NITROSTAT) 0.4 MG SL tablet Place 1 tablet (0.4 mg total) under the tongue every 5 (five) minutes as needed for chest pain. 10/09/14 11/30/17  Imogene Burn, PA-C  NOVOLOG FLEXPEN 100 UNIT/ML FlexPen  08/24/17   [provider]  PHENADOZ 25 MG suppository Place 25 mg rectally daily as needed (headaches).  09/26/14   [provider]  predniSONE (DELTASONE) 10 MG tablet 4 tabs for 2 days, then 3 tabs for 2 days, 2 tabs for 2 days, then 1 tab for 2 days, then stop 10/07/17   Parrett, Tammy S, NP  Roflumilast (DALIRESP) 250 MCG TABS Take 250 mcg by mouth daily. Patient not taking: Reported on 10/07/2017 08/09/17   Chesley Mires, MD  simvastatin (ZOCOR) 20 MG tablet Take 20 mg by mouth every evening.  12/14/12   [provider]  TRUE METRIX BLOOD GLUCOSE TEST test strip  09/18/17   [provider]  umeclidinium bromide (INCRUSE ELLIPTA) 62.5 MCG/INH AEPB Inhale 1 puff into the lungs daily. 10/07/17   Chesley Mires, MD  vitamin B-12 (CYANOCOBALAMIN) 1000 MCG tablet Take 1,000 mcg by mouth daily.    [provider]    Family History Family History  Problem Relation Age of Onset  . Heart Problems Mother   . Heart defect Mother        valve problems  . Heart Problems Father   . Heart disease Brother   . Lung cancer Daughter        non small cell   . Diabetes Maternal Aunt   . Diabetes Maternal Uncle   . Throat cancer Brother   . Colon cancer Neg Hx   . Colon polyps Neg Hx   . Esophageal cancer Neg Hx   . Rectal cancer Neg Hx   . Stomach cancer Neg Hx     Social History Social History   Tobacco Use  . Smoking status: Former Smoker    Packs/day: 1.50    Years: 34.00    Pack years: 51.00    Types: Cigarettes    Last  attempt to quit:  05/04/1997    Years since quitting: 20.5  . Smokeless tobacco: Never Used  Substance Use Topics  . Alcohol use: No  . Drug use: No     Allergies   Patient has no known allergies.   Review of Systems Review of Systems  Constitutional: Negative for fever.  Musculoskeletal: Positive for myalgias and neck pain. Negative for back pain, gait problem and neck stiffness.  Skin: Negative for wound.  Neurological: Positive for dizziness and headaches. Negative for syncope, weakness and numbness.  All other systems reviewed and are negative.    Physical Exam Updated Vital Signs BP (!) 178/64 (BP Location: Left Arm)   Pulse 77   Temp 98.4 F (36.9 C) (Oral)   Resp 15   Ht 5\' 6"  (1.676 m)   Wt 104.3 kg (230 lb)   SpO2 98%   BMI 37.12 kg/m   Physical Exam  Constitutional: She is oriented to person, place, and time. She appears well-developed and well-nourished. No distress.  Calm and cooperative. Obese  HENT:  Head: Normocephalic and atraumatic.  Tenderness over the right posterior scalp  Eyes: Pupils are equal, round, and reactive to light. Conjunctivae are normal. Right eye exhibits no discharge. Left eye exhibits no discharge. No scleral icterus.  Neck: Spinous process tenderness and muscular tenderness present. Decreased range of motion present.  Cardiovascular: Normal rate and regular rhythm.  Pulmonary/Chest: Effort normal and breath sounds normal. No respiratory distress.  Abdominal: She exhibits no distension.  Neurological: She is alert and oriented to person, place, and time.  Mental Status:  Alert, oriented, thought content appropriate, able to give a coherent history. Speech fluent without evidence of aphasia. Able to follow 2 step commands without difficulty.  Cranial Nerves:  II:  Peripheral visual fields grossly normal, pupils equal, round, reactive to light III,IV, VI: ptosis not present, extra-ocular motions intact bilaterally  V,VII: smile symmetric, facial  light touch sensation equal VIII: hearing grossly normal to voice  X: uvula elevates symmetrically  XI: bilateral shoulder shrug symmetric and strong XII: midline tongue extension without fassiculations Motor:  Normal tone. 5/5 in upper and lower extremities bilaterally including strong and equal grip strength and dorsiflexion/plantar flexion Sensory: Pinprick and light touch normal in all extremities.  Cerebellar: normal finger-to-nose with bilateral upper extremities Gait: normal gait and balance CV: distal pulses palpable throughout    Skin: Skin is warm and dry.  Psychiatric: She has a normal mood and affect. Her behavior is normal.  Nursing note and vitals reviewed.    ED Treatments / Results  Labs (all labs ordered are listed, but only abnormal results are displayed) Labs Reviewed - No data to display  EKG None  Radiology Ct Head Wo Contrast  Result Date: 10/30/2017 CLINICAL DATA:  Headache for 1 week.  Skull base and neck pain. EXAM: CT HEAD WITHOUT CONTRAST CT CERVICAL SPINE WITHOUT CONTRAST TECHNIQUE: Multidetector CT imaging of the head and cervical spine was performed following the standard protocol without intravenous contrast. Multiplanar CT image reconstructions of the cervical spine were also generated. COMPARISON:  CT head 06/24/2011 FINDINGS: CT HEAD FINDINGS Brain: There is no mass, hemorrhage or extra-axial collection. The size and configuration of the ventricles and extra-axial CSF spaces are normal. There is no acute or chronic infarction. The brain parenchyma is normal. Vascular: No abnormal hyperdensity of the major intracranial arteries or dural venous sinuses. No intracranial atherosclerosis. Skull: The visualized skull base, calvarium and extracranial soft tissues are normal. Sinuses/Orbits:  No fluid levels or advanced mucosal thickening of the visualized paranasal sinuses. No mastoid or middle ear effusion. The orbits are normal. CT CERVICAL SPINE FINDINGS  Alignment: No static subluxation. Facets are aligned. Occipital condyles are normally positioned. Skull base and vertebrae: No acute fracture.  C4-7 ACDF. Soft tissues and spinal canal: No prevertebral fluid or swelling. No visible canal hematoma. Disc levels: No advanced spinal canal or neural foraminal stenosis. Upper chest: No pneumothorax, pulmonary nodule or pleural effusion. Other: Normal visualized paraspinal cervical soft tissues. IMPRESSION: 1. Normal brain. 2. No acute fracture or static subluxation of the cervical spine. 3. C4-C7 ACDF without hardware abnormality. Electronically Signed   By: Ulyses Jarred M.D.   On: 10/30/2017 22:50   Ct Cervical Spine Wo Contrast  Result Date: 10/30/2017 CLINICAL DATA:  Headache for 1 week.  Skull base and neck pain. EXAM: CT HEAD WITHOUT CONTRAST CT CERVICAL SPINE WITHOUT CONTRAST TECHNIQUE: Multidetector CT imaging of the head and cervical spine was performed following the standard protocol without intravenous contrast. Multiplanar CT image reconstructions of the cervical spine were also generated. COMPARISON:  CT head 06/24/2011 FINDINGS: CT HEAD FINDINGS Brain: There is no mass, hemorrhage or extra-axial collection. The size and configuration of the ventricles and extra-axial CSF spaces are normal. There is no acute or chronic infarction. The brain parenchyma is normal. Vascular: No abnormal hyperdensity of the major intracranial arteries or dural venous sinuses. No intracranial atherosclerosis. Skull: The visualized skull base, calvarium and extracranial soft tissues are normal. Sinuses/Orbits: No fluid levels or advanced mucosal thickening of the visualized paranasal sinuses. No mastoid or middle ear effusion. The orbits are normal. CT CERVICAL SPINE FINDINGS Alignment: No static subluxation. Facets are aligned. Occipital condyles are normally positioned. Skull base and vertebrae: No acute fracture.  C4-7 ACDF. Soft tissues and spinal canal: No prevertebral  fluid or swelling. No visible canal hematoma. Disc levels: No advanced spinal canal or neural foraminal stenosis. Upper chest: No pneumothorax, pulmonary nodule or pleural effusion. Other: Normal visualized paraspinal cervical soft tissues. IMPRESSION: 1. Normal brain. 2. No acute fracture or static subluxation of the cervical spine. 3. C4-C7 ACDF without hardware abnormality. Electronically Signed   By: Ulyses Jarred M.D.   On: 10/30/2017 22:50    Procedures Procedures (including critical care time)  Medications Ordered in ED Medications  lidocaine (LIDODERM) 5 % 1 patch (1 patch Transdermal Patch Applied 10/30/17 2142)  oxyCODONE-acetaminophen (PERCOCET/ROXICET) 5-325 MG per tablet 1 tablet (has no administration in time range)  morphine 4 MG/ML injection 4 mg (4 mg Intravenous Given 10/30/17 2142)  acetaminophen (TYLENOL) tablet 650 mg (650 mg Oral Given 10/30/17 2142)     Initial Impression / Assessment and Plan / ED Course  I have reviewed the triage vital signs and the nursing notes.  Pertinent labs & imaging results that were available during my care of the patient were reviewed by me and considered in my medical decision making (see chart for details).  77 year old female presents with acute neck and posterior headache. Pain is most likely MSK in nature as she is very tender to palpation of the area. Also consider hardware abnormality, atypical migraine, rebound headache from chronic narcotic use. Her neurologic exam is normal. Headache was not a thunderclap headache and has been going on for 5 days. Will provide pain control and obtain CT head and C-spine.  Imaging is negative for acute abnormality. They are not quite understanding of how she could have pain despite nothing acute on  her imaging. The pain medicine given did help but wore off. Will give additional dose of medicine here and rx for pain at home. Advised f/u with Dr. Saintclair Halsted.  Final Clinical Impressions(s) / ED Diagnoses    Final diagnoses:  Bad headache  Neck pain    ED Discharge Orders    None       Recardo Evangelist, PA-C 10/31/17 0043    Julianne Rice, MD 11/02/17 1535

## 2017-10-30 NOTE — ED Triage Notes (Signed)
Patient c/o HA that began one week ago. Went to see primary care phys on Thursday and was given a shot which patient states did help with HA for approx 2 hours then HA returned.

## 2017-10-30 NOTE — Discharge Instructions (Signed)
Take Percocet as needed for pain Take Robaxin as needed for muscle pain/spasms

## 2017-11-05 ENCOUNTER — Other Ambulatory Visit: Payer: Self-pay | Admitting: Family Medicine

## 2017-11-05 ENCOUNTER — Other Ambulatory Visit (HOSPITAL_COMMUNITY): Payer: Self-pay | Admitting: Family Medicine

## 2017-11-05 DIAGNOSIS — R51 Headache: Principal | ICD-10-CM

## 2017-11-05 DIAGNOSIS — R519 Headache, unspecified: Secondary | ICD-10-CM

## 2017-11-05 DIAGNOSIS — Z6837 Body mass index (BMI) 37.0-37.9, adult: Secondary | ICD-10-CM | POA: Diagnosis not present

## 2017-11-08 DIAGNOSIS — E875 Hyperkalemia: Secondary | ICD-10-CM | POA: Diagnosis not present

## 2017-11-17 ENCOUNTER — Ambulatory Visit (HOSPITAL_COMMUNITY)
Admission: RE | Admit: 2017-11-17 | Discharge: 2017-11-17 | Disposition: A | Discharge status: Home / Self Care | Payer: Medicare HMO | Source: Ambulatory Visit | Attending: Family Medicine | Admitting: Family Medicine

## 2017-11-17 DIAGNOSIS — R519 Headache, unspecified: Secondary | ICD-10-CM

## 2017-11-17 DIAGNOSIS — R51 Headache: Secondary | ICD-10-CM | POA: Diagnosis not present

## 2017-11-17 DIAGNOSIS — I6782 Cerebral ischemia: Secondary | ICD-10-CM | POA: Diagnosis not present

## 2017-11-17 LAB — CREATININE, SERUM
Creatinine, Ser: 1.24 mg/dL — ABNORMAL HIGH (ref 0.44–1.00)
GFR calc Af Amer: 48 mL/min — ABNORMAL LOW (ref >60)
GFR calc non Af Amer: 41 mL/min — ABNORMAL LOW (ref >60)

## 2017-11-17 MED ORDER — GADOBENATE DIMEGLUMINE 529 MG/ML IV SOLN
20.0000 mL | Freq: Once | INTRAVENOUS | Status: AC | PRN
  Administered 2017-11-17: 20 mL via INTRAVENOUS

## 2017-11-25 DIAGNOSIS — M542 Cervicalgia: Secondary | ICD-10-CM | POA: Diagnosis not present

## 2017-11-25 DIAGNOSIS — R51 Headache: Secondary | ICD-10-CM | POA: Diagnosis not present

## 2017-12-07 ENCOUNTER — Telehealth: Payer: Self-pay

## 2017-12-07 ENCOUNTER — Ambulatory Visit: Payer: Medicare HMO | Admitting: Pulmonary Disease

## 2017-12-07 NOTE — Telephone Encounter (Signed)
Pt husband and son has called several times. I tried to help them send a transmission but was unsuccessful. I looked up the pt device in [facility] and they have been shipped a new device. I explained to them the transmission will not send on the old monitor they will have to wait on the new monitor. I gave the son the tracking number of the new monitor and [employer] number.

## 2017-12-07 NOTE — Telephone Encounter (Signed)
I got the pt taken care of.

## 2017-12-08 DIAGNOSIS — M5481 Occipital neuralgia: Secondary | ICD-10-CM | POA: Diagnosis not present

## 2017-12-08 DIAGNOSIS — M7918 Myalgia, other site: Secondary | ICD-10-CM | POA: Diagnosis not present

## 2017-12-09 ENCOUNTER — Ambulatory Visit (INDEPENDENT_AMBULATORY_CARE_PROVIDER_SITE_OTHER): Payer: Medicare HMO | Admitting: *Deleted

## 2017-12-09 DIAGNOSIS — I495 Sick sinus syndrome: Secondary | ICD-10-CM | POA: Diagnosis not present

## 2017-12-10 NOTE — Progress Notes (Signed)
Remote pacemaker transmission.   

## 2017-12-16 DIAGNOSIS — E1149 Type 2 diabetes mellitus with other diabetic neurological complication: Secondary | ICD-10-CM | POA: Diagnosis not present

## 2017-12-16 DIAGNOSIS — Z6838 Body mass index (BMI) 38.0-38.9, adult: Secondary | ICD-10-CM | POA: Diagnosis not present

## 2017-12-16 DIAGNOSIS — I10 Essential (primary) hypertension: Secondary | ICD-10-CM | POA: Diagnosis not present

## 2017-12-16 DIAGNOSIS — E1121 Type 2 diabetes mellitus with diabetic nephropathy: Secondary | ICD-10-CM | POA: Diagnosis not present

## 2017-12-20 ENCOUNTER — Encounter: Payer: Self-pay | Admitting: Internal Medicine

## 2017-12-20 ENCOUNTER — Ambulatory Visit: Payer: Medicare HMO | Admitting: Internal Medicine

## 2017-12-20 VITALS — BP 124/78 | HR 67 | Ht 66.0 in | Wt 237.4 lb

## 2017-12-20 DIAGNOSIS — Z950 Presence of cardiac pacemaker: Secondary | ICD-10-CM

## 2017-12-20 DIAGNOSIS — I10 Essential (primary) hypertension: Secondary | ICD-10-CM | POA: Diagnosis not present

## 2017-12-20 DIAGNOSIS — I493 Ventricular premature depolarization: Secondary | ICD-10-CM

## 2017-12-20 DIAGNOSIS — R079 Chest pain, unspecified: Secondary | ICD-10-CM | POA: Diagnosis not present

## 2017-12-20 DIAGNOSIS — I495 Sick sinus syndrome: Secondary | ICD-10-CM

## 2017-12-20 MED ORDER — FUROSEMIDE 40 MG PO TABS: 40.0000 mg | ORAL_TABLET | Freq: Every day | ORAL | 3 refills | Status: DC

## 2017-12-20 NOTE — Progress Notes (Signed)
Patient Care Team: Isaias Sakai, DO as PCP - General (Family Medicine) Milus Banister, MD as Consulting Physician (Gastroenterology)   HPI  Kayla [name] is a 77 y.o. female Seen in follow-up for pacemaker implanted (7/17)  for sinus bradycardia.     Because of the above, she was given an event recorder which demonstrates junctional rhythms in the high 30s and low 40s associated with symptoms of dizziness  She has a history of nonobstructive coronary artery disease by catheterization 2006 with a low risk Myoview 2013.  Echocardiogram 2015 normal LV function mild left atrial enlargement    Date Cr K  7/19 1.24 4.0       She has complaints of exertional arm heaviness.  Relieved by rest.  She also has chronic dyspnea on exertion.  This is worse in the context of intermittent peripheral edema.  She has orthostatic lightheadedness and some presyncope.  Has treated sleep apnea.       Past Medical History:  Diagnosis Date  . Allergy   . Anxiety   . Arthritis   . CAD (coronary artery disease)    Mild per remote cath in 2006, /    Nuclear, January, 2013, no significant abnormality,  . Carotid artery disease ([place])    Doppler, January, 2013, 0 78% RIC A., 24-23% LICA, stable  . Cataract   . Complication of anesthesia    wakes up "mean"  . COPD (chronic obstructive pulmonary disease) ([place])   . Diabetes mellitus, type 2 ([place])   . Diverticula of colon   . Dizziness    occasional  . Ejection fraction   . GERD (gastroesophageal reflux disease)   . Goiter    hx of  . Headache(784.0)    migraine  . Hyperlipidemia   . Hypertension   . Hypothyroidism   . Left ventricular hypertrophy   . Leg edema    occasional swelling  . Obesity   . OSA (obstructive sleep apnea)    cpap setting of 12  . PONV (postoperative nausea and vomiting)    severe after every surgery  . Pulmonary hypertension, secondary   . Sleep apnea     Past Surgical History:    Procedure Laterality Date  . ABDOMINAL HYSTERECTOMY     with appendectomy and colon polypectomy  . ANTERIOR CERVICAL DECOMP/DISCECTOMY FUSION N/A 11/07/2014   Procedure: Anterior Cervical diskectomy and fusion Cervical four-five, Cervical five-six, Cervical six-seven;  Surgeon: Kary Kos, MD;  Location: [facility];  Service: Neurosurgery;  Laterality: N/A;  . APPENDECTOMY    . ARTERY BIOPSY Right 02/16/2017   Procedure: BIOPSY TEMPORAL ARTERY;  Surgeon: Katha Cabal, MD;  Location: [facility] OR[facility];  Service: Vascular;  Laterality: Right;  . BACK SURGERY    . CARDIAC CATHETERIZATION    . CATARACT EXTRACTION W/ INTRAOCULAR LENS  IMPLANT, BILATERAL Bilateral   . CHOLECYSTECTOMY N/A 04/29/2016   Procedure: LAPAROSCOPIC CHOLECYSTECTOMY WITH INTRAOPERATIVE CHOLANGIOGRAM;  Surgeon: Alphonsa Overall, MD;  Location: [facility];  Service: General;  Laterality: N/A;  . EP IMPLANTABLE DEVICE N/A 11/08/2015   Procedure: Pacemaker Implant;  Surgeon: Deboraha Sprang, MD;  Location: [facility] [facility];  Service: Cardiovascular;  Laterality: N/A;  . INSERT / REPLACE / REMOVE PACEMAKER  2017   Dr. Jens Som [facility])  . JOINT REPLACEMENT    . LUMBAR DISC SURGERY  1990's   x 2  . THYROIDECTOMY  2011  . TOTAL KNEE ARTHROPLASTY  07/27/2011  Procedure: TOTAL KNEE ARTHROPLASTY;  Surgeon: Mauri Pole, MD;  Location: [facility];  Service: Orthopedics;  Laterality: Left;  . UPPER ESOPHAGEAL ENDOSCOPIC ULTRASOUND (EUS) N/A 05/14/2016   Procedure: UPPER ESOPHAGEAL ENDOSCOPIC ULTRASOUND (EUS);  Surgeon: Milus Banister, MD;  Location: [facility];  Service: Endoscopy;  Laterality: N/A;  radial only-will be done mod if needed    Current Outpatient Medications  Medication Sig Dispense Refill  . acetaminophen (TYLENOL) 500 MG tablet Take 1,000 mg by mouth every 4 (four) hours as needed for mild pain or moderate pain.     Marland Kitchen albuterol (PROVENTIL) (2.5 MG/3ML) 0.083% nebulizer solution Take 2.5 mg by nebulization every 6 (six) hours  as needed for wheezing or shortness of breath.    Marland Kitchen albuterol (VENTOLIN HFA) 108 (90 Base) MCG/ACT inhaler Inhale 2 puffs into the lungs every 6 (six) hours as needed for wheezing or shortness of breath. 3 Inhaler 3  . amLODipine (NORVASC) 10 MG tablet Take 10 mg by mouth daily.    Marland Kitchen aspirin EC 81 MG tablet Take 81 mg by mouth daily.    . budesonide-formoterol (SYMBICORT) 160-4.5 MCG/ACT inhaler INHALE 2 PUFFS INTO THE LUNGS 2 TIMES DAILY. 3 Inhaler 3  . chlorpheniramine (CHLOR-TRIMETON) 4 MG tablet Take 1 tablet (4 mg total) by mouth at bedtime. 14 tablet 0  . cholecalciferol (VITAMIN D) 1000 units tablet Take 1,000 Units by mouth daily.    . ferrous sulfate 325 (65 FE) MG tablet Take 325 mg by mouth 2 (two) times daily.  3  . fluticasone (FLONASE) 50 MCG/ACT nasal spray USE 2 SPRAYS NASALLY DAILY 48 g 1  . furosemide (LASIX) 20 MG tablet Take 20 mg by mouth daily.     Marland Kitchen gabapentin (NEURONTIN) 800 MG tablet Take 800 mg by mouth 3 (three) times daily.     . hydrochlorothiazide (HYDRODIURIL) 50 MG tablet Take 50 mg by mouth daily.  0  . HYDROcodone-acetaminophen (NORCO/VICODIN) 5-325 MG tablet Take 1-2 tablets by mouth every 6 (six) hours as needed for moderate pain. 30 tablet 0  . insulin aspart (NOVOLOG FLEXPEN) 100 UNIT/ML FlexPen Inject 6-10 Units into the skin 3 (three) times daily as needed for high blood sugar (CBG >130).    . insulin glargine (LANTUS) 100 UNIT/ML injection Inject 100 Units into the skin 2 (two) times daily.     Marland Kitchen levothyroxine (SYNTHROID, LEVOTHROID) 175 MCG tablet Take 175 mcg by mouth daily before breakfast.     . LORazepam (ATIVAN) 2 MG tablet Take 2 mg by mouth at bedtime as needed for anxiety (depression).     Marland Kitchen losartan (COZAAR) 100 MG tablet Take 100 mg by mouth daily.     . metFORMIN (GLUCOPHAGE) 500 MG tablet Take 1,000 mg by mouth 2 (two) times daily with a meal.    . metoprolol tartrate (LOPRESSOR) 50 MG tablet Take 50 mg by mouth 2 (two) times daily.    .  nitroGLYCERIN (NITROSTAT) 0.4 MG SL tablet Place 1 tablet (0.4 mg total) under the tongue every 5 (five) minutes as needed for chest pain. 25 tablet 3  . PRESCRIPTION MEDICATION Inhale into the lungs at bedtime. CPAP    . promethazine (PHENADOZ) 25 MG suppository Place 25 mg rectally daily as needed (headache).    . simvastatin (ZOCOR) 20 MG tablet Take 20 mg by mouth at bedtime.     . SUMAtriptan (IMITREX) 25 MG tablet Take 25 mg by mouth See admin instructions. Take one tablet (25 mg) by mouth  at onset of migraine headache. May repeat in 2 hours if needed.    . TRUE METRIX BLOOD GLUCOSE TEST test strip     . umeclidinium bromide (INCRUSE ELLIPTA) 62.5 MCG/INH AEPB Inhale 1 puff into the lungs daily. 3 each 1  . vitamin B-12 (CYANOCOBALAMIN) 1000 MCG tablet Take 1,000 mcg by mouth daily.     Current Facility-Administered Medications  Medication Dose Route Frequency Provider Last Rate Last Dose  . 0.9 %  sodium chloride infusion  500 mL Intravenous Continuous Milus Banister, MD        No Known Allergies    Review of Systems negative except from HPI and PMH  Physical Exam Ht 5\' 6"  (1.676 m)   Wt 237 lb 6.4 oz (107.7 kg)   BMI 38.32 kg/m  Well developed and nourished in no acute distress HENT normal Neck supple with JVP-8 + HJR Clear Regular rate and rhythm, no murmurs or gallops Abd-soft with active BS No Clubbing cyanosis edema Skin-warm and dry A & Oriented  Grossly normal sensory and motor function  ECG demonstrates  A pacing @ 67 23/08/37  Assessment and  Plan  Sinus node dysfunction   Pacemaker-[employer]tronic  The patient's device was interrogated.  The information was reviewed. No changes were made in the programming.      Exertional chest discomfort  Orthostatic lightheadedness  COPD   OSA treated   Hypertension  HFpEF  Sinus node dysfunction with reasonable heart rate excursion  Exertional chest discomfort particular in the context of multiple cardiac  risk factors is concerning.  We will undertake CTA.  She has volume related dyspnea.  We will change her hydrochlorothiazide to Lasix.  She has been taking 20 mg a couple of times a week, so we will increase her to 40 daily.  We will plan to recheck her electrolytes in a couple of weeks; they were normal last month.  I am concerned about her orthostatic lightheadedness being aggravated by augmented diuresis.  We have discussed the physiology.  I have recommended use of an abdominal binder.  More than 50% of 40 min was spent in counseling related to the above     Current medicines are reviewed at length with the patient today .  The patient does not  have concerns regarding medicines.

## 2017-12-20 NOTE — Patient Instructions (Addendum)
Medication Instructions:  Your physician has recommended you make the following change in your medication:   1. Stop your HCTZ 2. Begin taking 40mg  of Lasix, two tablets of your current supply, every morning  Labwork: Your physician recommends that you return for lab work in: 2 weeks by your PCP or any [facility]. Take your paper prescription with you.   Testing/Procedures: Non-Cardiac CT Angiography (CTA), is a special type of CT scan that uses a computer to produce multi-dimensional views of major blood vessels throughout the body. In CT angiography, a contrast material is injected through an IV to help visualize the blood vessels  Follow-Up: Your physician wants you to follow-up in: One Year with Dr Caryl Comes. You will receive a reminder letter in the mail two months in advance. If you don't receive a letter, please call our office to schedule the follow-up appointment.  Remote monitoring is used to monitor your Pacemaker from home. This monitoring reduces the number of office visits required to check your device to one time per year. It allows [place] to keep an eye on the functioning of your device to ensure it is working properly. You are scheduled for a device check from home on 03/10/2018. You may send your transmission at any time that day. If you have a wireless device, the transmission will be sent automatically. After your physician reviews your transmission, you will receive a postcard with your next transmission date.    Any Other Special Instructions Will Be Listed Below (If Applicable).     If you need a refill on your cardiac medications before your next appointment, please call your pharmacy.

## 2017-12-28 ENCOUNTER — Encounter: Payer: Self-pay | Admitting: Pulmonary Disease

## 2017-12-28 ENCOUNTER — Ambulatory Visit: Payer: Medicare HMO | Admitting: Pulmonary Disease

## 2017-12-28 VITALS — BP 128/68 | HR 78 | Ht 65.0 in | Wt 240.0 lb

## 2017-12-28 DIAGNOSIS — G4733 Obstructive sleep apnea (adult) (pediatric): Secondary | ICD-10-CM

## 2017-12-28 DIAGNOSIS — J449 Chronic obstructive pulmonary disease, unspecified: Secondary | ICD-10-CM | POA: Diagnosis not present

## 2017-12-28 DIAGNOSIS — Z9989 Dependence on other enabling machines and devices: Secondary | ICD-10-CM

## 2017-12-28 NOTE — Progress Notes (Signed)
[facility]  Chief Complaint  Patient presents with  . Follow-up    pt states she is wearing cpap avg 6-8hr nightly- feels pressure & mask are okay. occ chest tightness & sob with exertion.     Constitutional: BP 128/68 (BP Location: Left Arm, Cuff Size: Normal)   Pulse 78   Ht 5\' 5"  (1.651 m)   Wt 240 lb (108.9 kg)   SpO2 95%   BMI 39.94 kg/m   History of Present Illness: Kayla [name] is a 77 y.o. female COPD, OSA.  She was seen in June for an exacerbation.  CXR from 10/07/17 (reviewed by me) showed basilar scarring.  She was not able to tolerate daliresp.  She feels incruse has helped.  Not having cough, wheeze, sputum, chest congestion.  Has some sinus congestion, but not bad.  Not having fever, sore throat, or gland swelling.  Uses CPAP nightly w/o issue.  Comprehensive Respiratory Exam:  Appearance - well kempt  ENMT - nasal mucosa moist, turbinates clear, midline nasal septum, wears dentures, no gingival bleeding, no oral exudates, no tonsillar hypertrophy Neck - no masses, trachea midline, no thyromegaly, no elevation in JVP Respiratory - normal appearance of chest wall, normal respiratory effort w/o accessory muscle use, no dullness on percussion, no wheezing or rales CV - s1s2 regular rate and rhythm, no murmurs, no peripheral edema, radial pulses symmetric GI - soft, non tender, no masses Lymph - no adenopathy noted in neck and axillary areas MSK - normal muscle strength and tone, normal gait Ext - no cyanosis, clubbing, or joint inflammation noted Skin - no rashes, lesions, or ulcers Neuro - oriented to person, place, and time Psych - normal mood and affect  Assessment/Plan:  COPD with chronic bronchitis. - she was intolerant of daliresp - continue incruse, symbicort, and prn albuterol - she will wait until high dose flu shot is available  Obstructive sleep apnea. - she is compliant with CPAP and reports benefit  from therapy - continue auto CPAP   Patient Instructions  Follow up in 6 months    Chesley Mires, MD [place] 12/28/2017, 9:59 AM Pager:  [phone]  Flow Sheet  Sleep tests: PSG 02/2005 >> AHI 10  Auto CPAP 09/21/12 to 10/05/12 >> used on 15 of 15 nights with average 10 hrs 22 min.  Average AHI 1.8 with mean CPAP 5 cm H2O and 90 th percentile CPAP 6 cm H2O. CPAP titration 02/19/14 >> CPAP 11 cm H2O >> AHI 0.7, PLMI 81.4, did not need supplemental oxygen Auto CPAP 11/28/17 to 12/27/17 >> used on 30 of 30 nights with average 9 hrs 9 min.  Average AHI 3.3 with mean CPAP 6 and 90 th percentile CPAP 6 cm H2O  Pulmonary tests Spirometry 10/19/08 >> FEV1 1.55 (68%), FEV1% 70  Spirometry 08/29/12 >> FEV1 1.38 (59%), FEV1% 58 FeNO 03/09/16 >> 7  Cardiac tests Echo 07/31/13 >> mild LVH, EF 55 to 60%, grade 1 DD  Past Medical History: She  has a past medical history of Allergy, Anxiety, Arthritis, CAD (coronary artery disease), Carotid artery disease ([place]), Cataract, Complication of anesthesia, COPD (chronic obstructive pulmonary disease) ([place]), Diabetes mellitus, type 2 ([place]), Diverticula of colon, Dizziness, Ejection fraction, GERD (gastroesophageal reflux disease), Goiter, Headache(784.0), Hyperlipidemia, Hypertension, Hypothyroidism, Left ventricular hypertrophy, Leg edema, Obesity, OSA (obstructive sleep apnea), PONV (postoperative nausea and vomiting), Pulmonary hypertension, secondary, and Sleep apnea.  Past Surgical History: She  has a past surgical history that  includes Thyroidectomy (2011); Total knee arthroplasty (07/27/2011); Abdominal hysterectomy; Anterior cervical decomp/discectomy fusion (N/A, 11/07/2014); Cardiac catheterization (N/A, 11/08/2015); Lumbar disc surgery (1990's); Cholecystectomy (N/A, 04/29/2016); Upper esophageal endoscopic ultrasound (eus) (N/A, 05/14/2016); Cataract extraction w/ intraocular lens  implant, bilateral (Bilateral); Insert / replace /  remove pacemaker (2017); Cardiac catheterization; Back surgery; Appendectomy; Joint replacement; and Artery Biopsy (Right, 02/16/2017).  Family History: Her family history includes Diabetes in her maternal aunt and maternal uncle; Heart Problems in her father and mother; Heart defect in her mother; Heart disease in her brother; Lung cancer in her daughter; Throat cancer in her brother.  Social History: She  reports that she quit smoking about 20 years ago. Her smoking use included cigarettes. She has a 51.00 pack-year smoking history. She has never used smokeless tobacco. She reports that she does not drink alcohol or use drugs.  Medications: Allergies as of 12/28/2017   No Known Allergies     Medication List        Accurate as of 12/28/17  9:59 AM. Always use your most recent med list.          acetaminophen 500 MG tablet Commonly known as:  TYLENOL Take 1,000 mg by mouth every 4 (four) hours as needed for mild pain or moderate pain.   albuterol (2.5 MG/3ML) 0.083% nebulizer solution Commonly known as:  PROVENTIL Take 2.5 mg by nebulization every 6 (six) hours as needed for wheezing or shortness of breath.   albuterol 108 (90 Base) MCG/ACT inhaler Commonly known as:  PROVENTIL HFA;VENTOLIN HFA Inhale 2 puffs into the lungs every 6 (six) hours as needed for wheezing or shortness of breath.   amLODipine 10 MG tablet Commonly known as:  NORVASC Take 10 mg by mouth daily.   aspirin EC 81 MG tablet Take 81 mg by mouth daily.   budesonide-formoterol 160-4.5 MCG/ACT inhaler Commonly known as:  SYMBICORT INHALE 2 PUFFS INTO THE LUNGS 2 TIMES DAILY.   chlorpheniramine 4 MG tablet Commonly known as:  CHLOR-TRIMETON Take 1 tablet (4 mg total) by mouth at bedtime.   cholecalciferol 1000 units tablet Commonly known as:  VITAMIN D Take 1,000 Units by mouth daily.   ferrous sulfate 325 (65 FE) MG tablet Take 325 mg by mouth 2 (two) times daily.   fluticasone 50 MCG/ACT nasal  spray Commonly known as:  FLONASE USE 2 SPRAYS NASALLY DAILY   furosemide 40 MG tablet Commonly known as:  LASIX Take 1 tablet (40 mg total) by mouth daily.   gabapentin 800 MG tablet Commonly known as:  NEURONTIN Take 800 mg by mouth 3 (three) times daily.   HYDROcodone-acetaminophen 5-325 MG tablet Commonly known as:  NORCO/VICODIN Take 1-2 tablets by mouth every 6 (six) hours as needed for moderate pain.   insulin glargine 100 UNIT/ML injection Commonly known as:  LANTUS Inject 100 Units into the skin 2 (two) times daily.   levothyroxine 175 MCG tablet Commonly known as:  SYNTHROID, LEVOTHROID Take 175 mcg by mouth daily before breakfast.   LORazepam 2 MG tablet Commonly known as:  ATIVAN Take 2 mg by mouth at bedtime as needed for anxiety (depression).   losartan 100 MG tablet Commonly known as:  COZAAR Take 100 mg by mouth daily.   metFORMIN 500 MG tablet Commonly known as:  GLUCOPHAGE Take 1,000 mg by mouth 2 (two) times daily with a meal.   metoprolol tartrate 50 MG tablet Commonly known as:  LOPRESSOR Take 100 mg by mouth 2 (two) times daily.  nitroGLYCERIN 0.4 MG SL tablet Commonly known as:  NITROSTAT Place 1 tablet (0.4 mg total) under the tongue every 5 (five) minutes as needed for chest pain.   NOVOLOG FLEXPEN 100 UNIT/ML FlexPen Generic drug:  insulin aspart Inject 6-10 Units into the skin 3 (three) times daily as needed for high blood sugar (CBG >130).   PHENADOZ 25 MG suppository Generic drug:  promethazine Place 25 mg rectally daily as needed (headache).   PRESCRIPTION MEDICATION Inhale into the lungs at bedtime. CPAP   simvastatin 20 MG tablet Commonly known as:  ZOCOR Take 20 mg by mouth at bedtime.   SUMAtriptan 25 MG tablet Commonly known as:  IMITREX Take 25 mg by mouth See admin instructions. Take one tablet (25 mg) by mouth at onset of migraine headache. May repeat in 2 hours if needed.   TRUE METRIX BLOOD GLUCOSE TEST test  strip Generic drug:  glucose blood   umeclidinium bromide 62.5 MCG/INH Aepb Commonly known as:  INCRUSE ELLIPTA Inhale 1 puff into the lungs daily.   vitamin B-12 1000 MCG tablet Commonly known as:  CYANOCOBALAMIN Take 1,000 mcg by mouth daily.

## 2017-12-28 NOTE — Patient Instructions (Signed)
Follow up in 6 months 

## 2017-12-29 DIAGNOSIS — M5481 Occipital neuralgia: Secondary | ICD-10-CM | POA: Diagnosis not present

## 2017-12-29 DIAGNOSIS — M7918 Myalgia, other site: Secondary | ICD-10-CM | POA: Diagnosis not present

## 2017-12-29 DIAGNOSIS — R51 Headache: Secondary | ICD-10-CM | POA: Diagnosis not present

## 2017-12-29 DIAGNOSIS — M542 Cervicalgia: Secondary | ICD-10-CM | POA: Diagnosis not present

## 2017-12-29 LAB — CUP PACEART REMOTE DEVICE CHECK
Battery Remaining Longevity: 96 mo
Battery Voltage: 3.02 V
Brady Statistic AP VP Percent: 0.03 %
Brady Statistic AP VS Percent: 51.23 %
Brady Statistic AS VP Percent: 0.03 %
Brady Statistic AS VS Percent: 48.72 %
Brady Statistic RA Percent Paced: 51.23 %
Brady Statistic RV Percent Paced: 0.06 %
Date Time Interrogation Session: 20190808171423
Implantable Lead Implant Date: 20170707
Implantable Lead Implant Date: 20170707
Implantable Lead Location: 753859
Implantable Lead Location: 753860
Implantable Lead Model: 3830
Implantable Lead Model: 5076
Implantable Pulse Generator Implant Date: 20170707
Lead Channel Impedance Value: 304 Ohm
Lead Channel Impedance Value: 342 Ohm
Lead Channel Impedance Value: 380 Ohm
Lead Channel Impedance Value: 399 Ohm
Lead Channel Pacing Threshold Amplitude: 0.875 V
Lead Channel Pacing Threshold Amplitude: 1.875 V
Lead Channel Pacing Threshold Pulse Width: 0.4 ms
Lead Channel Pacing Threshold Pulse Width: 0.4 ms
Lead Channel Sensing Intrinsic Amplitude: 4.875 mV
Lead Channel Sensing Intrinsic Amplitude: 4.875 mV
Lead Channel Sensing Intrinsic Amplitude: 6.25 mV
Lead Channel Sensing Intrinsic Amplitude: 6.25 mV
Lead Channel Setting Pacing Amplitude: 1.75 V
Lead Channel Setting Pacing Amplitude: 2.5 V
Lead Channel Setting Pacing Pulse Width: 1 ms
Lead Channel Setting Sensing Sensitivity: 2.8 mV

## 2017-12-30 ENCOUNTER — Other Ambulatory Visit: Payer: Medicare HMO | Admitting: *Deleted

## 2017-12-30 ENCOUNTER — Other Ambulatory Visit: Payer: Self-pay | Admitting: Internal Medicine

## 2017-12-30 ENCOUNTER — Telehealth: Payer: Self-pay

## 2017-12-30 DIAGNOSIS — R079 Chest pain, unspecified: Secondary | ICD-10-CM

## 2017-12-30 NOTE — Telephone Encounter (Signed)
Spoke with pt regarding her cardiac CT. Pt understands pre procedure instructions and will pick up her instruction form from the office today. Pt will also have pre procedure labs drawn today as well. Pt had no additional questions.

## 2017-12-31 ENCOUNTER — Ambulatory Visit (HOSPITAL_COMMUNITY)
Admission: RE | Admit: 2017-12-31 | Discharge: 2017-12-31 | Disposition: A | Discharge status: Home / Self Care | Payer: Medicare HMO | Source: Ambulatory Visit | Attending: Internal Medicine | Admitting: Internal Medicine

## 2017-12-31 DIAGNOSIS — R079 Chest pain, unspecified: Secondary | ICD-10-CM | POA: Diagnosis not present

## 2017-12-31 DIAGNOSIS — I700 Atherosclerosis of aorta: Secondary | ICD-10-CM | POA: Diagnosis not present

## 2017-12-31 DIAGNOSIS — I878 Other specified disorders of veins: Secondary | ICD-10-CM | POA: Diagnosis not present

## 2017-12-31 LAB — BASIC METABOLIC PANEL
BUN/Creatinine Ratio: 19 (ref 12–28)
BUN: 25 mg/dL (ref 8–27)
CO2: 25 mmol/L (ref 20–29)
Calcium: 9.4 mg/dL (ref 8.7–10.3)
Chloride: 100 mmol/L (ref 96–106)
Creatinine, Ser: 1.34 mg/dL — ABNORMAL HIGH (ref 0.57–1.00)
GFR calc Af Amer: 44 mL/min/{1.73_m2} — ABNORMAL LOW (ref >59)
GFR calc non Af Amer: 39 mL/min/{1.73_m2} — ABNORMAL LOW (ref >59)
Glucose: 257 mg/dL — ABNORMAL HIGH (ref 65–99)
Potassium: 4.9 mmol/L (ref 3.5–5.2)
Sodium: 142 mmol/L (ref 134–144)

## 2017-12-31 LAB — CBC
Hematocrit: 34.1 % (ref 34.0–46.6)
Hemoglobin: 11.3 g/dL (ref 11.1–15.9)
MCH: 29 pg (ref 26.6–33.0)
MCHC: 33.1 g/dL (ref 31.5–35.7)
MCV: 87 fL (ref 79–97)
Platelets: 421 10*3/uL (ref 150–450)
RBC: 3.9 x10E6/uL (ref 3.77–5.28)
RDW: 13.2 % (ref 12.3–15.4)
WBC: 12.4 10*3/uL — ABNORMAL HIGH (ref 3.4–10.8)

## 2017-12-31 MED ORDER — IOPAMIDOL (ISOVUE-370) INJECTION 76%
100.0000 mL | Freq: Once | INTRAVENOUS | Status: AC | PRN
  Administered 2017-12-31: 90 mL via INTRAVENOUS

## 2017-12-31 MED ORDER — METOPROLOL TARTRATE 5 MG/5ML IV SOLN
INTRAVENOUS | Status: AC
  Administered 2017-12-31: 5 mg via INTRAVENOUS
  Filled 2017-12-31: qty 10

## 2017-12-31 MED ORDER — METOPROLOL TARTRATE 5 MG/5ML IV SOLN
5.0000 mg | INTRAVENOUS | Status: DC | PRN
  Administered 2017-12-31: 5 mg via INTRAVENOUS
  Filled 2017-12-31: qty 5

## 2017-12-31 MED ORDER — NITROGLYCERIN 0.4 MG SL SUBL
0.8000 mg | SUBLINGUAL_TABLET | SUBLINGUAL | Status: DC | PRN
  Administered 2017-12-31: 0.8 mg via SUBLINGUAL
  Filled 2017-12-31: qty 25

## 2017-12-31 MED ORDER — NITROGLYCERIN 0.4 MG SL SUBL
SUBLINGUAL_TABLET | SUBLINGUAL | Status: AC
  Administered 2017-12-31: 0.8 mg via SUBLINGUAL
  Filled 2017-12-31: qty 2

## 2018-01-06 DIAGNOSIS — M5481 Occipital neuralgia: Secondary | ICD-10-CM | POA: Insufficient documentation

## 2018-01-06 DIAGNOSIS — M7918 Myalgia, other site: Secondary | ICD-10-CM

## 2018-01-06 HISTORY — DX: Myalgia, other site: M79.18

## 2018-01-06 HISTORY — DX: Occipital neuralgia: M54.81

## 2018-01-10 ENCOUNTER — Telehealth: Payer: Self-pay

## 2018-01-10 MED ORDER — ATORVASTATIN CALCIUM 40 MG PO TABS: 40.0000 mg | ORAL_TABLET | Freq: Every day | ORAL | 3 refills | Status: DC

## 2018-01-10 NOTE — Telephone Encounter (Signed)
Spoke with pt regarding her CT results. She verbalized understanding of the blockage she had to OM2. She understands she will come off simvastatin and begin atorvastatin 40mg  qd. Pt understands if her angina becomes more frequent, she will call back to discuss treatment options.

## 2018-01-10 NOTE — Telephone Encounter (Signed)
-----   Message from Deboraha Sprang, MD sent at 01/09/2018  4:41 PM EDT ----- Please Inform Patient that -#CT scan##   is abnormal and suggests some degere of blockage but in a small artery and medical therapy prob best thing.   Would change her statin to atorv a 40 and ask how she is doing with her orthostatic lightheadedness Thanks

## 2018-01-19 ENCOUNTER — Telehealth: Payer: Self-pay | Admitting: Internal Medicine

## 2018-01-19 NOTE — Telephone Encounter (Signed)
Pt calling to clarify her new medication dosage and side effects. Pt is to start Atorvastatin 40mg  qd and discontinue simvastatin. Pt has verbalized understanding and had no additional questions.

## 2018-01-19 NOTE — Telephone Encounter (Signed)
New Message:     Pt c/o medication issue:  1. Name of Medication: atorvastatin (LIPITOR) 40 MG tablet  2. How are you currently taking this medication (dosage and times per day)? Take 1 tablet (40 mg total) by mouth daily.  3. Are you having a reaction (difficulty breathing--STAT)? No   4. What is your medication issue? Patient has question about the medication before she begin taking it.

## 2018-01-20 DIAGNOSIS — Z1339 Encounter for screening examination for other mental health and behavioral disorders: Secondary | ICD-10-CM | POA: Diagnosis not present

## 2018-01-20 DIAGNOSIS — Z1331 Encounter for screening for depression: Secondary | ICD-10-CM | POA: Diagnosis not present

## 2018-01-20 DIAGNOSIS — Z23 Encounter for immunization: Secondary | ICD-10-CM | POA: Diagnosis not present

## 2018-01-20 DIAGNOSIS — Z139 Encounter for screening, unspecified: Secondary | ICD-10-CM | POA: Diagnosis not present

## 2018-01-20 DIAGNOSIS — Z6839 Body mass index (BMI) 39.0-39.9, adult: Secondary | ICD-10-CM | POA: Diagnosis not present

## 2018-01-20 DIAGNOSIS — Z0000 Encounter for general adult medical examination without abnormal findings: Secondary | ICD-10-CM | POA: Diagnosis not present

## 2018-01-20 DIAGNOSIS — E669 Obesity, unspecified: Secondary | ICD-10-CM | POA: Diagnosis not present

## 2018-01-20 DIAGNOSIS — Z136 Encounter for screening for cardiovascular disorders: Secondary | ICD-10-CM | POA: Diagnosis not present

## 2018-01-20 DIAGNOSIS — E785 Hyperlipidemia, unspecified: Secondary | ICD-10-CM | POA: Diagnosis not present

## 2018-01-21 DIAGNOSIS — M25551 Pain in right hip: Secondary | ICD-10-CM | POA: Insufficient documentation

## 2018-01-21 DIAGNOSIS — M7071 Other bursitis of hip, right hip: Secondary | ICD-10-CM | POA: Diagnosis not present

## 2018-01-21 HISTORY — DX: Pain in right hip: M25.551

## 2018-01-28 ENCOUNTER — Ambulatory Visit: Payer: Medicare HMO | Admitting: Podiatry

## 2018-01-29 LAB — CUP PACEART INCLINIC DEVICE CHECK
Battery Remaining Longevity: 101 mo
Battery Voltage: 3.01 V
Brady Statistic AP VP Percent: 0.02 %
Brady Statistic AP VS Percent: 52.84 %
Brady Statistic AS VP Percent: 0.03 %
Brady Statistic AS VS Percent: 47.11 %
Brady Statistic RA Percent Paced: 52.84 %
Brady Statistic RV Percent Paced: 0.06 %
Date Time Interrogation Session: 20190819124647
Implantable Lead Implant Date: 20170707
Implantable Lead Implant Date: 20170707
Implantable Lead Location: 753859
Implantable Lead Location: 753860
Implantable Lead Model: 3830
Implantable Lead Model: 5076
Implantable Pulse Generator Implant Date: 20170707
Lead Channel Impedance Value: 285 Ohm
Lead Channel Impedance Value: 342 Ohm
Lead Channel Impedance Value: 380 Ohm
Lead Channel Impedance Value: 399 Ohm
Lead Channel Pacing Threshold Amplitude: 0.75 V
Lead Channel Pacing Threshold Amplitude: 1.25 V
Lead Channel Pacing Threshold Pulse Width: 0.4 ms
Lead Channel Pacing Threshold Pulse Width: 1 ms
Lead Channel Sensing Intrinsic Amplitude: 5.375 mV
Lead Channel Sensing Intrinsic Amplitude: 5.875 mV
Lead Channel Setting Pacing Amplitude: 1.5 V
Lead Channel Setting Pacing Amplitude: 2.5 V
Lead Channel Setting Pacing Pulse Width: 1 ms
Lead Channel Setting Sensing Sensitivity: 2.8 mV

## 2018-03-10 ENCOUNTER — Telehealth: Payer: Self-pay

## 2018-03-10 ENCOUNTER — Encounter: Payer: Medicare HMO | Admitting: *Deleted

## 2018-03-10 NOTE — Telephone Encounter (Signed)
LMOVM reminding pt to send remote transmission.   

## 2018-03-14 ENCOUNTER — Encounter: Payer: Self-pay | Admitting: Cardiology

## 2018-03-15 ENCOUNTER — Ambulatory Visit (INDEPENDENT_AMBULATORY_CARE_PROVIDER_SITE_OTHER): Payer: Medicare HMO | Admitting: *Deleted

## 2018-03-15 DIAGNOSIS — I495 Sick sinus syndrome: Secondary | ICD-10-CM

## 2018-03-15 DIAGNOSIS — I10 Essential (primary) hypertension: Secondary | ICD-10-CM

## 2018-03-15 NOTE — Progress Notes (Signed)
Remote pacemaker transmission.   

## 2018-03-21 DIAGNOSIS — Z1339 Encounter for screening examination for other mental health and behavioral disorders: Secondary | ICD-10-CM | POA: Diagnosis not present

## 2018-03-21 DIAGNOSIS — D638 Anemia in other chronic diseases classified elsewhere: Secondary | ICD-10-CM | POA: Diagnosis not present

## 2018-03-21 DIAGNOSIS — Z6839 Body mass index (BMI) 39.0-39.9, adult: Secondary | ICD-10-CM | POA: Diagnosis not present

## 2018-03-21 DIAGNOSIS — Z9181 History of falling: Secondary | ICD-10-CM | POA: Diagnosis not present

## 2018-03-21 DIAGNOSIS — E1149 Type 2 diabetes mellitus with other diabetic neurological complication: Secondary | ICD-10-CM | POA: Diagnosis not present

## 2018-03-21 DIAGNOSIS — E890 Postprocedural hypothyroidism: Secondary | ICD-10-CM | POA: Diagnosis not present

## 2018-04-13 DIAGNOSIS — I10 Essential (primary) hypertension: Secondary | ICD-10-CM | POA: Diagnosis not present

## 2018-04-13 DIAGNOSIS — E1149 Type 2 diabetes mellitus with other diabetic neurological complication: Secondary | ICD-10-CM | POA: Diagnosis not present

## 2018-04-13 DIAGNOSIS — Z6838 Body mass index (BMI) 38.0-38.9, adult: Secondary | ICD-10-CM | POA: Diagnosis not present

## 2018-04-14 DIAGNOSIS — M545 Low back pain: Secondary | ICD-10-CM | POA: Diagnosis not present

## 2018-05-10 ENCOUNTER — Other Ambulatory Visit: Payer: Self-pay | Admitting: Pulmonary Disease

## 2018-05-15 LAB — CUP PACEART REMOTE DEVICE CHECK
Battery Remaining Longevity: 97 mo
Battery Voltage: 3.02 V
Brady Statistic AP VP Percent: 0.06 %
Brady Statistic AP VS Percent: 53.62 %
Brady Statistic AS VP Percent: 0.04 %
Brady Statistic AS VS Percent: 46.27 %
Brady Statistic RA Percent Paced: 53.65 %
Brady Statistic RV Percent Paced: 0.12 %
Date Time Interrogation Session: 20191112061703
Implantable Lead Implant Date: 20170707
Implantable Lead Implant Date: 20170707
Implantable Lead Location: 753859
Implantable Lead Location: 753860
Implantable Lead Model: 3830
Implantable Lead Model: 5076
Implantable Pulse Generator Implant Date: 20170707
Lead Channel Impedance Value: 285 Ohm
Lead Channel Impedance Value: 342 Ohm
Lead Channel Impedance Value: 380 Ohm
Lead Channel Impedance Value: 380 Ohm
Lead Channel Pacing Threshold Amplitude: 0.875 V
Lead Channel Pacing Threshold Amplitude: 1.875 V
Lead Channel Pacing Threshold Pulse Width: 0.4 ms
Lead Channel Pacing Threshold Pulse Width: 0.4 ms
Lead Channel Sensing Intrinsic Amplitude: 5.875 mV
Lead Channel Sensing Intrinsic Amplitude: 5.875 mV
Lead Channel Sensing Intrinsic Amplitude: 6.25 mV
Lead Channel Sensing Intrinsic Amplitude: 6.25 mV
Lead Channel Setting Pacing Amplitude: 1.75 V
Lead Channel Setting Pacing Amplitude: 2.5 V
Lead Channel Setting Pacing Pulse Width: 1 ms
Lead Channel Setting Sensing Sensitivity: 2.8 mV

## 2018-06-10 DIAGNOSIS — M5481 Occipital neuralgia: Secondary | ICD-10-CM | POA: Diagnosis not present

## 2018-06-10 DIAGNOSIS — M542 Cervicalgia: Secondary | ICD-10-CM | POA: Diagnosis not present

## 2018-06-13 ENCOUNTER — Encounter: Payer: Self-pay | Admitting: Pulmonary Disease

## 2018-06-13 ENCOUNTER — Ambulatory Visit: Payer: Medicare HMO | Admitting: Pulmonary Disease

## 2018-06-13 VITALS — BP 158/58 | HR 81 | Ht 65.0 in | Wt 225.8 lb

## 2018-06-13 DIAGNOSIS — J449 Chronic obstructive pulmonary disease, unspecified: Secondary | ICD-10-CM | POA: Diagnosis not present

## 2018-06-13 DIAGNOSIS — G4733 Obstructive sleep apnea (adult) (pediatric): Secondary | ICD-10-CM | POA: Diagnosis not present

## 2018-06-13 DIAGNOSIS — Z9989 Dependence on other enabling machines and devices: Secondary | ICD-10-CM | POA: Diagnosis not present

## 2018-06-13 DIAGNOSIS — G4730 Sleep apnea, unspecified: Secondary | ICD-10-CM

## 2018-06-13 DIAGNOSIS — E669 Obesity, unspecified: Secondary | ICD-10-CM | POA: Diagnosis not present

## 2018-06-13 NOTE — Progress Notes (Signed)
[facility]  Chief Complaint  Patient presents with  . Follow-up    pt uses CPAP every night, states she has no issues; would like to get a different mask due to headaches    Constitutional:  BP (!) 158/58 (BP Location: Right Wrist, Cuff Size: Normal)   Pulse 81   Ht 5\' 5"  (1.651 m)   Wt 225 lb 12.8 oz (102.4 kg)   SpO2 96%   BMI 37.58 kg/m   Past Medical History:  Hypothyroidism s/p thyroidectomy, HTN, HLD, HA, GERD, Diverticulosis, DM, Cataract, CAD, OA, Anxiety, Allergies  Brief Summary:  Kayla [name] is a 78 y.o. female former smoker with COPD and obstructive sleep apnea.  She uses CPAP nightly.  No issues with mask fit.  Her machine broke  It is also more than 78 yrs old.  Not having sinus congestion, sore throat, dry mouth, or aerophagia.  She feels her breathing is doing better since she started incruse.  Not sure if she needs symbicort anymore.  Concerned about expense of medications.  Not having chest pain, fever, hemoptysis, gland swelling, or leg swelling.  Physical Exam:   Appearance - well kempt   ENMT - clear nasal mucosa, midline nasal  septum, no oral exudates, no LAN, trachea midline  Respiratory - normal chest wall, normal respiratory effort, no accessory muscle use, no wheeze/rales  CV - s1s2 regular rate and rhythm, no murmurs, no peripheral edema, radial pulses symmetric  GI - soft, non tender, no masses  Lymph - no adenopathy noted in neck and axillary areas  MSK - normal gait  Ext - no cyanosis, clubbing, or joint inflammation noted  Skin - no rashes, lesions, or ulcers  Neuro - normal strength, oriented x 3  Psych - normal mood and affect  Assessment/Plan:   COPD with chronic bronchitis. - intolerant of daliresp - improved with incruse - will try stopping symbicort; if symptoms get worse, then consider changing to trelegy - continue prn albuterol  Obstructive sleep apnea. - she is  compliant with CPAP and reports benefit - her machine is more than 78 yrs old and broke - will arrange for new auto CPAP set up  Obesity. - discussed importance of weight loss  Patient Instructions  Can try stopping symbicort  Continue incruse 1 puff daily  Will arrange for new auto CPAP set up  Follow up in 2 months after CPAP set up  A total of  26 minutes were spent face to face with the patient and more than half of that time involved counseling or coordination of care.   Chesley Mires, MD [facility] Pager: [phone] 06/13/2018, 11:07 AM  Flow Sheet     Pulmonary tests:  Spirometry 10/19/08 >>FEV1 1.55 (68%), FEV1% 70  Spirometry 08/29/12 >> FEV1 1.38 (59%), FEV1% 58 FeNO 03/09/16 >> 7  Sleep tests:  PSG 02/2005 >>AHI 10  CPAP titration 02/19/14 >> CPAP 11 cm H2O >> AHI 0.7, PLMI 81.4, did not need supplemental oxygen Auto CPAP 11/28/17 to 12/27/17 >> used on 30 of 30 nights with average 9 hrs 9 min.  Average AHI 3.3 with mean CPAP 6 and 90 th percentile CPAP 6 cm H2O  Cardiac tests:  Echo 07/31/13 >> mild LVH, EF 55 to 60%, grade 1 DD   Medications:   Allergies as of 06/13/2018   No Known Allergies     Medication List       Accurate as of June 13, 2018 11:07 AM. Always use your most recent med list.        acetaminophen 500 MG tablet Commonly known as:  TYLENOL Take 1,000 mg by mouth every 4 (four) hours as needed for mild pain or moderate pain.   albuterol (2.5 MG/3ML) 0.083% nebulizer solution Commonly known as:  PROVENTIL Take 2.5 mg by nebulization every 6 (six) hours as needed for wheezing or shortness of breath.   albuterol 108 (90 Base) MCG/ACT inhaler Commonly known as:  VENTOLIN HFA Inhale 2 puffs into the lungs every 6 (six) hours as needed for wheezing or shortness of breath.   amLODipine 10 MG tablet Commonly known as:  NORVASC Take 10 mg by mouth daily.   aspirin EC 81 MG tablet Take 81 mg by mouth  daily.   atorvastatin 40 MG tablet Commonly known as:  LIPITOR Take 1 tablet (40 mg total) by mouth daily.   chlorpheniramine 4 MG tablet Commonly known as:  CHLOR-TRIMETON Take 1 tablet (4 mg total) by mouth at bedtime.   cholecalciferol 1000 units tablet Commonly known as:  VITAMIN D Take 1,000 Units by mouth daily.   ferrous sulfate 325 (65 FE) MG tablet Take 325 mg by mouth 2 (two) times daily.   fluticasone 50 MCG/ACT nasal spray Commonly known as:  FLONASE USE 2 SPRAYS NASALLY DAILY   furosemide 40 MG tablet Commonly known as:  LASIX Take 1 tablet (40 mg total) by mouth daily.   gabapentin 800 MG tablet Commonly known as:  NEURONTIN Take 800 mg by mouth 3 (three) times daily.   HYDROcodone-acetaminophen 5-325 MG tablet Commonly known as:  NORCO/VICODIN Take 1-2 tablets by mouth every 6 (six) hours as needed for moderate pain.   INCRUSE ELLIPTA 62.5 MCG/INH Aepb Generic drug:  umeclidinium bromide INHALE 1 PUFF INTO THE LUNGS EVERY DAY   insulin glargine 100 UNIT/ML injection Commonly known as:  LANTUS Inject 100 Units into the skin 2 (two) times daily.   levothyroxine 175 MCG tablet Commonly known as:  SYNTHROID, LEVOTHROID Take 175 mcg by mouth daily before breakfast.   LORazepam 2 MG tablet Commonly known as:  ATIVAN Take 2 mg by mouth at bedtime as needed for anxiety (depression).   losartan 100 MG tablet Commonly known as:  COZAAR Take 100 mg by mouth daily.   metFORMIN 500 MG tablet Commonly known as:  GLUCOPHAGE Take 1,000 mg by mouth 2 (two) times daily with a meal.   metoprolol tartrate 50 MG tablet Commonly known as:  LOPRESSOR Take 100 mg by mouth 2 (two) times daily.   nitroGLYCERIN 0.4 MG SL tablet Commonly known as:  NITROSTAT Place 1 tablet (0.4 mg total) under the tongue every 5 (five) minutes as needed for chest pain.   NOVOLOG FLEXPEN 100 UNIT/ML FlexPen Generic drug:  insulin aspart Inject 6-10 Units into the skin 3 (three)  times daily as needed for high blood sugar (CBG >130).   PHENADOZ 25 MG suppository Generic drug:  promethazine Place 25 mg rectally daily as needed (headache).   PRESCRIPTION MEDICATION Inhale into the lungs at bedtime. CPAP   SUMAtriptan 25 MG tablet Commonly known as:  IMITREX Take 25 mg by mouth See admin instructions. Take one tablet (25 mg) by mouth at onset of migraine headache. May repeat in 2 hours if needed.   TRUE METRIX BLOOD GLUCOSE TEST test strip Generic drug:  glucose blood   vitamin B-12 1000 MCG tablet Commonly known as:  CYANOCOBALAMIN Take 1,000 mcg by mouth  daily.       Past Surgical History:  She  has a past surgical history that includes Thyroidectomy (2011); Total knee arthroplasty (07/27/2011); Abdominal hysterectomy; Anterior cervical decomp/discectomy fusion (N/A, 11/07/2014); Cardiac catheterization (N/A, 11/08/2015); Lumbar disc surgery (1990's); Cholecystectomy (N/A, 04/29/2016); Upper esophageal endoscopic ultrasound (eus) (N/A, 05/14/2016); Cataract extraction w/ intraocular lens  implant, bilateral (Bilateral); Insert / replace / remove pacemaker (2017); Cardiac catheterization; Back surgery; Appendectomy; Joint replacement; and Artery Biopsy (Right, 02/16/2017).  Family History:  Her family history includes Diabetes in her maternal aunt and maternal uncle; Heart Problems in her father and mother; Heart defect in her mother; Heart disease in her brother; Lung cancer in her daughter; Throat cancer in her brother.  Social History:  She  reports that she quit smoking about 21 years ago. Her smoking use included cigarettes. She has a 51.00 pack-year smoking history. She has never used smokeless tobacco. She reports that she does not drink alcohol or use drugs.

## 2018-06-13 NOTE — Patient Instructions (Signed)
Can try stopping symbicort  Continue incruse 1 puff daily  Will arrange for new auto CPAP set up  Follow up in 2 months after CPAP set up

## 2018-06-14 ENCOUNTER — Ambulatory Visit (INDEPENDENT_AMBULATORY_CARE_PROVIDER_SITE_OTHER): Payer: Medicare HMO

## 2018-06-14 DIAGNOSIS — I495 Sick sinus syndrome: Secondary | ICD-10-CM

## 2018-06-14 LAB — CUP PACEART REMOTE DEVICE CHECK
Battery Remaining Longevity: 92 mo
Battery Voltage: 3.01 V
Brady Statistic AP VP Percent: 0.12 %
Brady Statistic AP VS Percent: 41.89 %
Brady Statistic AS VP Percent: 0.05 %
Brady Statistic AS VS Percent: 57.94 %
Brady Statistic RA Percent Paced: 42 %
Brady Statistic RV Percent Paced: 0.21 %
Date Time Interrogation Session: 20200211185851
Implantable Lead Implant Date: 20170707
Implantable Lead Implant Date: 20170707
Implantable Lead Location: 753859
Implantable Lead Location: 753860
Implantable Lead Model: 3830
Implantable Lead Model: 5076
Implantable Pulse Generator Implant Date: 20170707
Lead Channel Impedance Value: 304 Ohm
Lead Channel Impedance Value: 361 Ohm
Lead Channel Impedance Value: 399 Ohm
Lead Channel Impedance Value: 399 Ohm
Lead Channel Pacing Threshold Amplitude: 0.875 V
Lead Channel Pacing Threshold Amplitude: 1.875 V
Lead Channel Pacing Threshold Pulse Width: 0.4 ms
Lead Channel Pacing Threshold Pulse Width: 0.4 ms
Lead Channel Sensing Intrinsic Amplitude: 6.625 mV
Lead Channel Sensing Intrinsic Amplitude: 6.625 mV
Lead Channel Sensing Intrinsic Amplitude: 6.75 mV
Lead Channel Sensing Intrinsic Amplitude: 6.75 mV
Lead Channel Setting Pacing Amplitude: 2 V
Lead Channel Setting Pacing Amplitude: 2.5 V
Lead Channel Setting Pacing Pulse Width: 1 ms
Lead Channel Setting Sensing Sensitivity: 2.8 mV

## 2018-06-27 NOTE — Progress Notes (Signed)
Remote pacemaker transmission.   

## 2018-06-28 DIAGNOSIS — G4700 Insomnia, unspecified: Secondary | ICD-10-CM | POA: Diagnosis not present

## 2018-06-28 DIAGNOSIS — I10 Essential (primary) hypertension: Secondary | ICD-10-CM | POA: Diagnosis not present

## 2018-06-28 DIAGNOSIS — E1149 Type 2 diabetes mellitus with other diabetic neurological complication: Secondary | ICD-10-CM | POA: Diagnosis not present

## 2018-06-28 DIAGNOSIS — E782 Mixed hyperlipidemia: Secondary | ICD-10-CM | POA: Diagnosis not present

## 2018-06-28 DIAGNOSIS — E890 Postprocedural hypothyroidism: Secondary | ICD-10-CM | POA: Diagnosis not present

## 2018-06-28 DIAGNOSIS — Z6837 Body mass index (BMI) 37.0-37.9, adult: Secondary | ICD-10-CM | POA: Diagnosis not present

## 2018-06-28 DIAGNOSIS — M545 Low back pain: Secondary | ICD-10-CM | POA: Diagnosis not present

## 2018-06-28 DIAGNOSIS — R339 Retention of urine, unspecified: Secondary | ICD-10-CM | POA: Diagnosis not present

## 2018-06-29 DIAGNOSIS — G4733 Obstructive sleep apnea (adult) (pediatric): Secondary | ICD-10-CM | POA: Diagnosis not present

## 2018-07-28 DIAGNOSIS — G4733 Obstructive sleep apnea (adult) (pediatric): Secondary | ICD-10-CM | POA: Diagnosis not present

## 2018-08-01 ENCOUNTER — Telehealth: Payer: Self-pay | Admitting: Pulmonary Disease

## 2018-08-01 NOTE — Telephone Encounter (Signed)
Pre last ov   Can try stopping symbicort  Continue incruse 1 puff daily  Will arrange for new auto CPAP set up  Follow up in 2 months after CPAP set up   Patient is aware of these rec's nothing further needed at this time.

## 2018-08-11 DIAGNOSIS — E1121 Type 2 diabetes mellitus with diabetic nephropathy: Secondary | ICD-10-CM | POA: Diagnosis not present

## 2018-08-11 DIAGNOSIS — J441 Chronic obstructive pulmonary disease with (acute) exacerbation: Secondary | ICD-10-CM | POA: Diagnosis not present

## 2018-08-28 DIAGNOSIS — G4733 Obstructive sleep apnea (adult) (pediatric): Secondary | ICD-10-CM | POA: Diagnosis not present

## 2018-08-29 ENCOUNTER — Ambulatory Visit: Payer: Medicare HMO | Admitting: Pulmonary Disease

## 2018-09-13 ENCOUNTER — Ambulatory Visit (INDEPENDENT_AMBULATORY_CARE_PROVIDER_SITE_OTHER): Payer: Medicare HMO | Admitting: *Deleted

## 2018-09-13 ENCOUNTER — Other Ambulatory Visit: Payer: Self-pay

## 2018-09-13 DIAGNOSIS — I10 Essential (primary) hypertension: Secondary | ICD-10-CM

## 2018-09-13 DIAGNOSIS — I495 Sick sinus syndrome: Secondary | ICD-10-CM

## 2018-09-15 LAB — CUP PACEART REMOTE DEVICE CHECK
Battery Remaining Longevity: 86 mo
Battery Voltage: 3.01 V
Brady Statistic AP VP Percent: 0.47 %
Brady Statistic AP VS Percent: 32.27 %
Brady Statistic AS VP Percent: 4.29 %
Brady Statistic AS VS Percent: 62.96 %
Brady Statistic RA Percent Paced: 30.14 %
Brady Statistic RV Percent Paced: 5.02 %
Date Time Interrogation Session: 20200512191816
Implantable Lead Implant Date: 20170707
Implantable Lead Implant Date: 20170707
Implantable Lead Location: 753859
Implantable Lead Location: 753860
Implantable Lead Model: 3830
Implantable Lead Model: 5076
Implantable Pulse Generator Implant Date: 20170707
Lead Channel Impedance Value: 285 Ohm
Lead Channel Impedance Value: 342 Ohm
Lead Channel Impedance Value: 380 Ohm
Lead Channel Impedance Value: 380 Ohm
Lead Channel Pacing Threshold Amplitude: 1 V
Lead Channel Pacing Threshold Pulse Width: 0.4 ms
Lead Channel Sensing Intrinsic Amplitude: 6 mV
Lead Channel Sensing Intrinsic Amplitude: 6 mV
Lead Channel Setting Pacing Amplitude: 2.25 V
Lead Channel Setting Pacing Amplitude: 2.5 V
Lead Channel Setting Pacing Pulse Width: 1 ms
Lead Channel Setting Sensing Sensitivity: 2.8 mV

## 2018-09-27 DIAGNOSIS — G4733 Obstructive sleep apnea (adult) (pediatric): Secondary | ICD-10-CM | POA: Diagnosis not present

## 2018-09-30 DIAGNOSIS — E1121 Type 2 diabetes mellitus with diabetic nephropathy: Secondary | ICD-10-CM | POA: Diagnosis not present

## 2018-09-30 DIAGNOSIS — E782 Mixed hyperlipidemia: Secondary | ICD-10-CM | POA: Diagnosis not present

## 2018-09-30 DIAGNOSIS — J449 Chronic obstructive pulmonary disease, unspecified: Secondary | ICD-10-CM | POA: Diagnosis not present

## 2018-09-30 DIAGNOSIS — E890 Postprocedural hypothyroidism: Secondary | ICD-10-CM | POA: Diagnosis not present

## 2018-09-30 DIAGNOSIS — I10 Essential (primary) hypertension: Secondary | ICD-10-CM | POA: Diagnosis not present

## 2018-09-30 DIAGNOSIS — G8929 Other chronic pain: Secondary | ICD-10-CM | POA: Diagnosis not present

## 2018-09-30 DIAGNOSIS — G4700 Insomnia, unspecified: Secondary | ICD-10-CM | POA: Diagnosis not present

## 2018-09-30 DIAGNOSIS — N183 Chronic kidney disease, stage 3 (moderate): Secondary | ICD-10-CM | POA: Diagnosis not present

## 2018-09-30 DIAGNOSIS — M545 Low back pain: Secondary | ICD-10-CM | POA: Diagnosis not present

## 2018-09-30 NOTE — Progress Notes (Signed)
Remote pacemaker transmission.   

## 2018-10-04 ENCOUNTER — Telehealth: Payer: Self-pay | Admitting: Pulmonary Disease

## 2018-10-04 MED ORDER — UMECLIDINIUM BROMIDE 62.5 MCG/INH IN AEPB: 1.0000 | INHALATION_SPRAY | Freq: Every day | RESPIRATORY_TRACT | 3 refills | Status: DC

## 2018-10-04 NOTE — Telephone Encounter (Signed)
Pt called office 08/01/2018 and instructions were stated to pt per her last OV with VS 06/13/2018 that she could try stopping symbicort and continue taking incruse daily.  Called and spoke with pt stating to her the info from her last OV with VS and pt expressed understanding. After stating this to pt, pt stated that she needed a refill of incruse to be sent to [facility] for her. Rx has been sent in for pt. Nothing further needed.

## 2018-10-06 ENCOUNTER — Telehealth: Payer: Self-pay | Admitting: *Deleted

## 2018-10-06 NOTE — Telephone Encounter (Signed)
Spoke with patient to advise of findings. Pt agreeable to AF Clinic appointment. Some intermittent coughing during May, currently has a cough and some chest congestion that is "breaking up" with medication prescribed by PCP. Feels like she is improving.  Advised pt we will get her scheduled for AF Clinic visit and request a new PPM transmission prior to the appointment for review of any recent AF episodes. She verbalizes understanding and agreement with plan. She does not have access to smart technology, will need a telephone visit.

## 2018-10-06 NOTE — Telephone Encounter (Signed)
Per result note:  Notes recorded by Jerline Pain, MD on 10/06/2018 at 2:17 PM EDT Sounds good. I am in agreement with referral to atrial fibrillation clinic. Candee Furbish, MD  ------  Notes recorded by Deboraha Sprang, MD on 09/27/2018 at 11:58 AM EDT Remote reviewed. This remote is abnormal for newly identified prolonged > 24 hrs of afib Appropriate to consider for anticoagulation  Will send onto [facility] to get his approval and afib clinic to get initiated if he is in agreement

## 2018-10-07 ENCOUNTER — Ambulatory Visit (HOSPITAL_COMMUNITY)
Admission: RE | Admit: 2018-10-07 | Discharge: 2018-10-07 | Disposition: A | Discharge status: Home / Self Care | Payer: Medicare HMO | Source: Ambulatory Visit | Attending: Physician Assistant | Admitting: Physician Assistant

## 2018-10-07 ENCOUNTER — Other Ambulatory Visit (HOSPITAL_COMMUNITY): Payer: Self-pay | Admitting: *Deleted

## 2018-10-07 ENCOUNTER — Other Ambulatory Visit: Payer: Self-pay

## 2018-10-07 DIAGNOSIS — I480 Paroxysmal atrial fibrillation: Secondary | ICD-10-CM

## 2018-10-07 MED ORDER — APIXABAN 5 MG PO TABS: 5.0000 mg | ORAL_TABLET | Freq: Two times a day (BID) | ORAL | 3 refills | Status: DC

## 2018-10-07 NOTE — Progress Notes (Signed)
Electrophysiology TeleHealth Note   Due to national recommendations of social distancing due to [place] 19, Audio telehealth visit is felt to be most appropriate for this patient at this time.  See consent below from today for patient consent regarding telehealth for the Atrial Fibrillation Clinic. Consent obtained verbally.   Date:  10/07/2018   ID:  Kayla [name], DOB [birth year], MRN [id number]  Location: home  Provider location: [address] [place], [place] [address] Evaluation Performed: Follow up  PCP:  Isaias Sakai, DO  Primary Cardiologist:  Dr Marlou Porch Primary Electrophysiologist: Dr Caryl Comes    CC: Follow up for atrial fibrillation    History of Present Illness: Kayla [name] is a 78 y.o. female who presents via audio conferencing for a telehealth visit today.   The patient is referred for new consultation regarding new onset atrial fibrillation by Dr Caryl Comes. Patient has a history of CAD, COPD, tobacco abuse, DM, GERD, HTN, hypothyroidism, and OSA. Patient noted to have paroxysmal atrial fibrillation noted on remote device transmission. Longest episode was about 7 days. Ventricular rates appear controlled. Patient states that she "thought everything was going great" until the device clinic called. She does report that she may have increase SOB over the last few weeks but this is improving. She has only had brief, infrequent palpitations lasting 4-5 minutes. There were no specific triggers that the patient could identify. She does report some chest tightness with some tingling in her arms and fingers. This has been chronic for her and she states that it has actually improved over the last several months.   Today, she denies symptoms of orthopnea, PND, lower extremity edema, claudication, dizziness, presyncope, syncope, bleeding, or neurologic sequela. The patient is tolerating medications without difficulties and is otherwise without complaint today.   she denies  symptoms of cough, fevers, chills, or new SOB worrisome for COVID 19.     Atrial Fibrillation Risk Factors:  she does have symptoms or diagnosis of sleep apnea. she is compliant with CPAP therapy.   she has a BMI of There is no height or weight on file to calculate BMI.. There were no vitals filed for this visit.  Past Medical History:  Diagnosis Date  . Allergy   . Anxiety   . Arthritis   . CAD (coronary artery disease)    Mild per remote cath in 2006, /    Nuclear, January, 2013, no significant abnormality,  . Carotid artery disease ([place])    Doppler, January, 2013, 0 26% RIC A., 20-35% LICA, stable  . Cataract   . Complication of anesthesia    wakes up "mean"  . COPD (chronic obstructive pulmonary disease) ([place])   . Diabetes mellitus, type 2 ([place])   . Diverticula of colon   . Dizziness    occasional  . Ejection fraction   . GERD (gastroesophageal reflux disease)   . Goiter    hx of  . Headache(784.0)    migraine  . Hyperlipidemia   . Hypertension   . Hypothyroidism   . Left ventricular hypertrophy   . Leg edema    occasional swelling  . Obesity   . OSA (obstructive sleep apnea)    cpap setting of 12  . PONV (postoperative nausea and vomiting)    severe after every surgery  . Pulmonary hypertension, secondary   . Sleep apnea    Past Surgical History:  Procedure Laterality Date  . ABDOMINAL HYSTERECTOMY     with appendectomy and  colon polypectomy  . ANTERIOR CERVICAL DECOMP/DISCECTOMY FUSION N/A 11/07/2014   Procedure: Anterior Cervical diskectomy and fusion Cervical four-five, Cervical five-six, Cervical six-seven;  Surgeon: Kary Kos, MD;  Location: [facility];  Service: Neurosurgery;  Laterality: N/A;  . APPENDECTOMY    . ARTERY BIOPSY Right 02/16/2017   Procedure: BIOPSY TEMPORAL ARTERY;  Surgeon: Katha Cabal, MD;  Location: [facility] ORS;  Service: Vascular;  Laterality: Right;  . BACK SURGERY    . CARDIAC CATHETERIZATION    . CATARACT EXTRACTION W/  INTRAOCULAR LENS  IMPLANT, BILATERAL Bilateral   . CHOLECYSTECTOMY N/A 04/29/2016   Procedure: LAPAROSCOPIC CHOLECYSTECTOMY WITH INTRAOPERATIVE CHOLANGIOGRAM;  Surgeon: Alphonsa Overall, MD;  Location: [facility];  Service: General;  Laterality: N/A;  . EP IMPLANTABLE DEVICE N/A 11/08/2015   Procedure: Pacemaker Implant;  Surgeon: Deboraha Sprang, MD;  Location: [facility];  Service: Cardiovascular;  Laterality: N/A;  . INSERT / REPLACE / REMOVE PACEMAKER  2017   Dr. Jens Som [facility])  . JOINT REPLACEMENT    . LUMBAR DISC SURGERY  1990's   x 2  . THYROIDECTOMY  2011  . TOTAL KNEE ARTHROPLASTY  07/27/2011   Procedure: TOTAL KNEE ARTHROPLASTY;  Surgeon: Mauri Pole, MD;  Location: [facility];  Service: Orthopedics;  Laterality: Left;  . UPPER ESOPHAGEAL ENDOSCOPIC ULTRASOUND (EUS) N/A 05/14/2016   Procedure: UPPER ESOPHAGEAL ENDOSCOPIC ULTRASOUND (EUS);  Surgeon: Milus Banister, MD;  Location: [facility];  Service: Endoscopy;  Laterality: N/A;  radial only-will be done mod if needed     Current Outpatient Medications  Medication Sig Dispense Refill  . acetaminophen (TYLENOL) 500 MG tablet Take 1,000 mg by mouth every 4 (four) hours as needed for mild pain or moderate pain.     Marland Kitchen albuterol (PROVENTIL) (2.5 MG/3ML) 0.083% nebulizer solution Take 2.5 mg by nebulization every 6 (six) hours as needed for wheezing or shortness of breath.    Marland Kitchen albuterol (VENTOLIN HFA) 108 (90 Base) MCG/ACT inhaler Inhale 2 puffs into the lungs every 6 (six) hours as needed for wheezing or shortness of breath. 3 Inhaler 3  . amLODipine (NORVASC) 10 MG tablet Take 10 mg by mouth daily.    Marland Kitchen aspirin EC 81 MG tablet Take 81 mg by mouth daily.    Marland Kitchen atorvastatin (LIPITOR) 40 MG tablet Take 1 tablet (40 mg total) by mouth daily. 90 tablet 3  . cholecalciferol (VITAMIN D) 1000 units tablet Take 1,000 Units by mouth daily.    . fluticasone (FLONASE) 50 MCG/ACT nasal spray USE 2 SPRAYS NASALLY DAILY 48 g 1  . furosemide  (LASIX) 40 MG tablet Take 1 tablet (40 mg total) by mouth daily. 90 tablet 3  . gabapentin (NEURONTIN) 800 MG tablet Take 800 mg by mouth 3 (three) times daily.     Marland Kitchen HYDROcodone-acetaminophen (NORCO/VICODIN) 5-325 MG tablet Take 1-2 tablets by mouth every 6 (six) hours as needed for moderate pain. 30 tablet 0  . insulin aspart (NOVOLOG FLEXPEN) 100 UNIT/ML FlexPen Inject 6-10 Units into the skin 3 (three) times daily as needed for high blood sugar (CBG >130).    . insulin glargine (LANTUS) 100 UNIT/ML injection Inject 100 Units into the skin 2 (two) times daily.     Marland Kitchen levothyroxine (SYNTHROID) 125 MCG tablet Take 125 mcg by mouth daily before breakfast.    . LORazepam (ATIVAN) 2 MG tablet Take 2 mg by mouth at bedtime as needed for anxiety (depression).     Marland Kitchen losartan (COZAAR) 100 MG  tablet Take 100 mg by mouth daily.     . metFORMIN (GLUCOPHAGE) 500 MG tablet Take 1,000 mg by mouth 2 (two) times daily with a meal.    . metoprolol tartrate (LOPRESSOR) 25 MG tablet Take 50 mg in the morning and 25 mg at night    . Phenylephrine-Chlorphen-DM (TUSS-DM PO) Take by mouth.    Marland Kitchen PRESCRIPTION MEDICATION Inhale into the lungs at bedtime. CPAP    . TRUE METRIX BLOOD GLUCOSE TEST test strip     . umeclidinium bromide (INCRUSE ELLIPTA) 62.5 MCG/INH AEPB Inhale 1 puff into the lungs daily. 90 each 3  . vitamin B-12 (CYANOCOBALAMIN) 1000 MCG tablet Take 1,000 mcg by mouth daily.    . nitroGLYCERIN (NITROSTAT) 0.4 MG SL tablet Place 1 tablet (0.4 mg total) under the tongue every 5 (five) minutes as needed for chest pain. (Patient not taking: Reported on 10/07/2018) 25 tablet 3  . promethazine (PHENADOZ) 25 MG suppository Place 25 mg rectally daily as needed (headache).    . SUMAtriptan (IMITREX) 25 MG tablet Take 25 mg by mouth See admin instructions. Take one tablet (25 mg) by mouth at onset of migraine headache. May repeat in 2 hours if needed.     Current Facility-Administered Medications  Medication Dose  Route Frequency Provider Last Rate Last Dose  . 0.9 %  sodium chloride infusion  500 mL Intravenous Continuous Milus Banister, MD        Allergies:   Patient has no known allergies.   Social History:  The patient  reports that she quit smoking about 21 years ago. Her smoking use included cigarettes. She has a 51.00 pack-year smoking history. She has never used smokeless tobacco. She reports that she does not drink alcohol or use drugs.   Family History:  The patient's  family history includes Diabetes in her maternal aunt and maternal uncle; Heart Problems in her father and mother; Heart defect in her mother; Heart disease in her brother; Lung cancer in her daughter; Throat cancer in her brother.    ROS:  Please see the history of present illness.   All other systems are personally reviewed and negative.    Recent Labs: 12/30/2017: BUN 25; Creatinine, Ser 1.34; Hemoglobin 11.3; Platelets 421; Potassium 4.9; Sodium 142  personally reviewed    Other studies personally reviewed: Additional studies/ records that were reviewed today include: Epic notes    ASSESSMENT AND PLAN:  1.  Paroxysmal atrial fibrillation New onset detected on remote device transmission.  Ventricular rates appear controlled. General education about atrial fibrillation discussed and questions answered.   We discussed her stroke risk and the risks and benefits of anticoagulation.  Will start Eliquis 5 mg BID Check Bmet/CBC on f/u.  Will have patient send manual transmission to assess recent AF burden.   This patients CHA2DS2-VASc Score and unadjusted Ischemic Stroke Rate (% per year) is equal to 9.7 % stroke rate/year from a score of 6  Above score calculated as 1 point each if present [CHF, HTN, DM, Vascular=MI/PAD/Aortic Plaque, Age if 65-74, or Female] Above score calculated as 2 points each if present [Age > 75, or Stroke/TIA/TE]  2. CAD Cardiac CTA 12/31/17 with diffuse plaque with moderate disease in RCA  and LAD. FFR showed stenosis in distal OM2. Patient reports her chest discomfort has actually improved since this test. Continue present therapy.  3. Sinus node dysfunction S/p PPM, followed by device clinic and Dr Caryl Comes.   4. OSA Encouraged compliance with CPAP  therapy.  5. Chronic diastolic CHF No symptoms of fluid overload. Weight stable. Continue present therapy.  6. HTN No changes today.  7. Obesity Lifestyle modification was discussed and encouraged including regular physical activity and weight reduction.   COVID screen The patient does not have any symptoms that suggest any further testing/ screening at this time.  Social distancing reinforced today.    Follow-up in AF clinic in one month.  Current medicines are reviewed at length with the patient today.   The patient does not have concerns regarding her medicines.  The following changes were made today:  Start Eliquis  Labs/ tests ordered today include:  No orders of the defined types were placed in this encounter.   Patient Risk:  after full review of this patients clinical status, I feel that they are at moderate risk at this time.   Today, I have spent 32 minutes with the patient with telehealth technology discussing atrial fibrillation, anticoagulation, and COVID-19 precautions.    Gwenlyn Perking PA-C 10/07/2018 2:54 PM  Afib [facility] [address] [place], [place] [address] [phone]   I hereby voluntarily request, consent and authorize the [facility] and its employed or contracted physicians, physician assistants, nurse practitioners or other licensed health care professionals (the Practitioner), to provide me with telemedicine health care services (the "Services") as deemed necessary by the treating Practitioner. I acknowledge and consent to receive the Services by the Practitioner via telemedicine. I understand that the telemedicine visit will involve  communicating with the Practitioner through live audiovisual communication technology and the disclosure of certain medical information by electronic transmission. I acknowledge that I have been given the opportunity to request an in-person assessment or other available alternative prior to the telemedicine visit and am voluntarily participating in the telemedicine visit.   I understand that I have the right to withhold or withdraw my consent to the use of telemedicine in the course of my care at any time, without affecting my right to future care or treatment, and that the Practitioner or I may terminate the telemedicine visit at any time. I understand that I have the right to inspect all information obtained and/or recorded in the course of the telemedicine visit and may receive copies of available information for a reasonable fee.  I understand that some of the potential risks of receiving the Services via telemedicine include:   Delay or interruption in medical evaluation due to technological equipment failure or disruption;  Information transmitted may not be sufficient (e.g. poor resolution of images) to allow for appropriate medical decision making by the Practitioner; and/or  In rare instances, security protocols could fail, causing a breach of personal health information.   Furthermore, I acknowledge that it is my responsibility to provide information about my medical history, conditions and care that is complete and accurate to the best of my ability. I acknowledge that Practitioner's advice, recommendations, and/or decision may be based on factors not within their control, such as incomplete or inaccurate data provided by me or distortions of diagnostic images or specimens that may result from electronic transmissions. I understand that the practice of medicine is not an exact science and that Practitioner makes no warranties or guarantees regarding treatment outcomes. I acknowledge that I will  receive a copy of this consent concurrently upon execution via email to the email address I last provided but may also request a printed copy by calling the office of the [facility].  I  understand that my insurance will be billed for this visit.   I have read or had this consent read to me.  I understand the contents of this consent, which adequately explains the benefits and risks of the Services being provided via telemedicine.  I have been provided ample opportunity to ask questions regarding this consent and the Services and have had my questions answered to my satisfaction.  I give my informed consent for the services to be provided through the use of telemedicine in my medical care  By participating in this telemedicine visit I agree to the above.

## 2018-10-07 NOTE — Telephone Encounter (Signed)
appt made

## 2018-10-10 ENCOUNTER — Telehealth (HOSPITAL_COMMUNITY): Payer: Self-pay | Admitting: *Deleted

## 2018-10-10 IMAGING — RF DG CHOLANGIOGRAM OPERATIVE
1 series · 8 of 8 positions shown · non-contrast
Comparison: Nuclear medicine HIDA scan with biliary ejection
fraction - 04/06/2016; CT abdomen pelvis - 03/24/2016

CLINICAL DATA: Intraoperative cholangiogram during laparoscopic
cholecystectomy.

EXAM:
INTRAOPERATIVE CHOLANGIOGRAM
FLUOROSCOPY TIME:  27 seconds

[Series 1: run · 2 acquisitions, 8 frames shown]
[im 1/2]
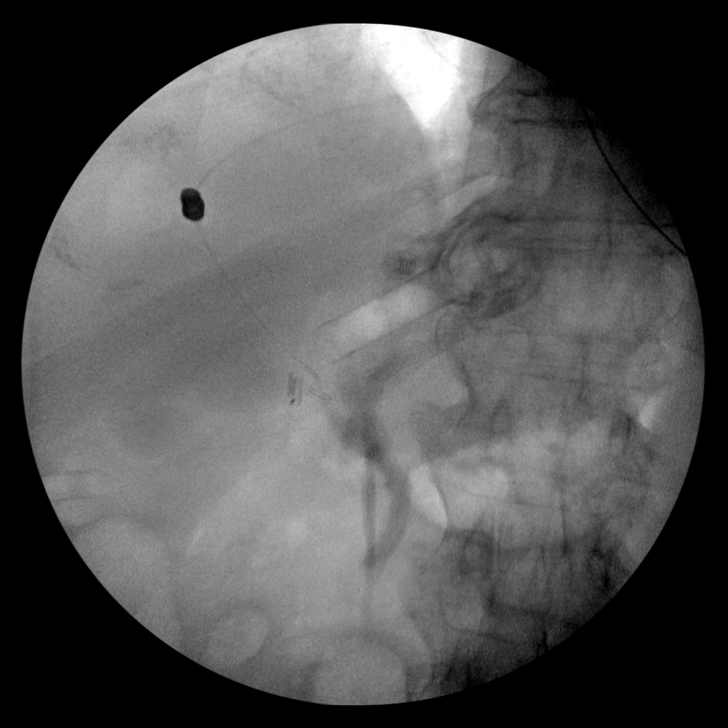
[im 1/2]
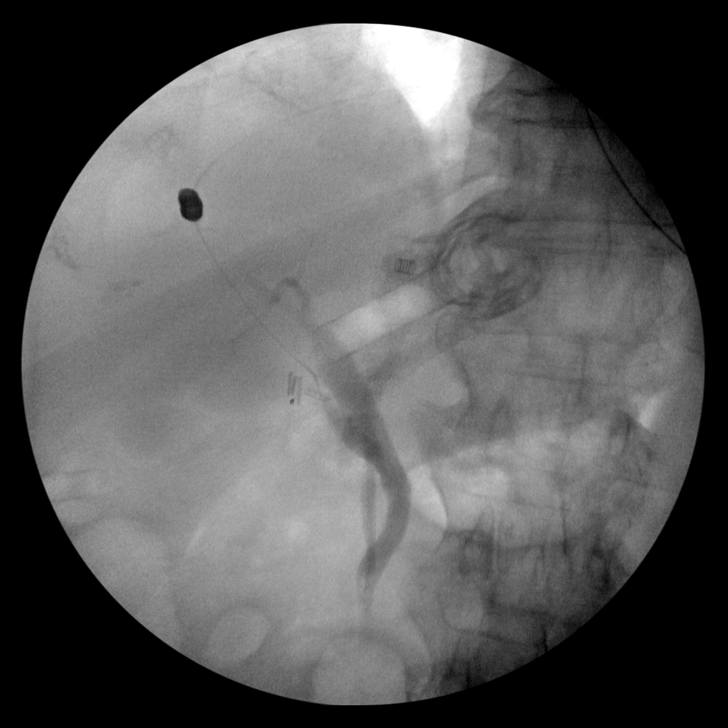
[im 1/2]
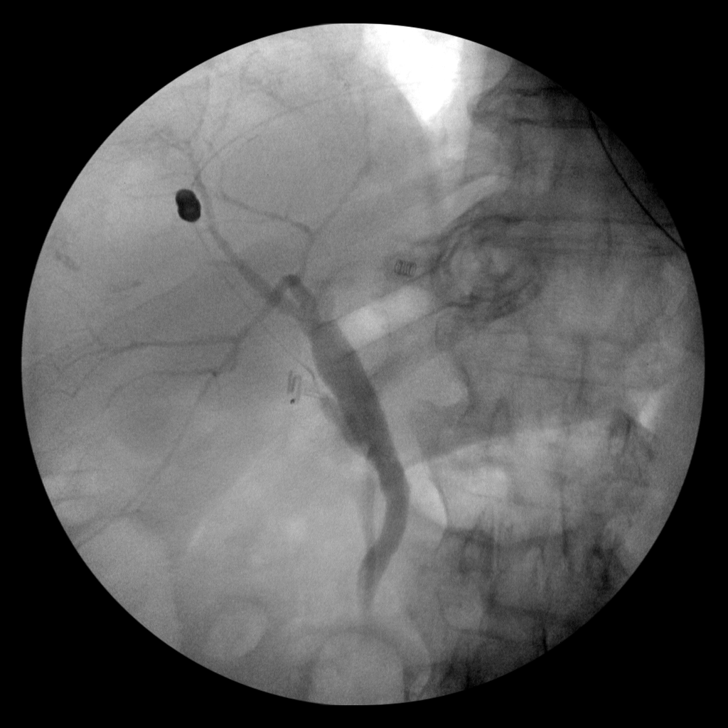
[im 1/2]
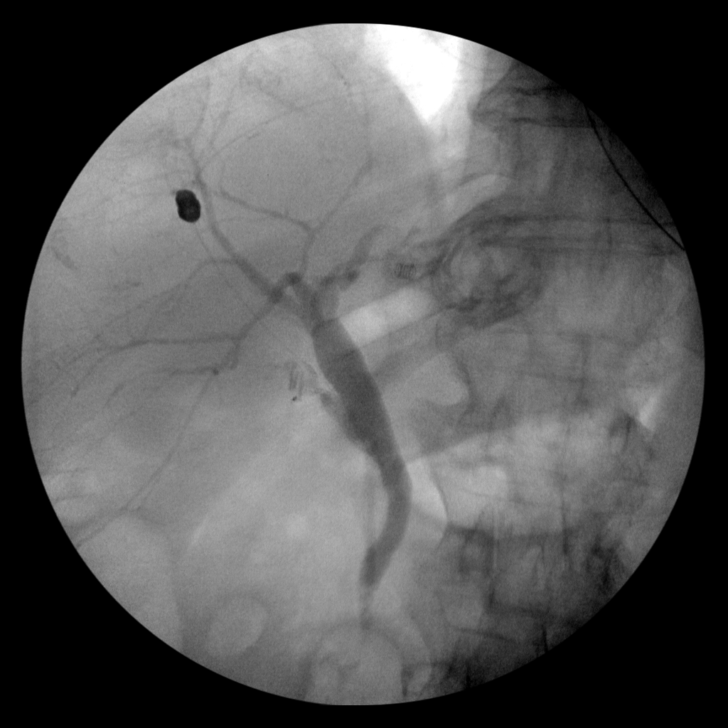
[im 2/2]
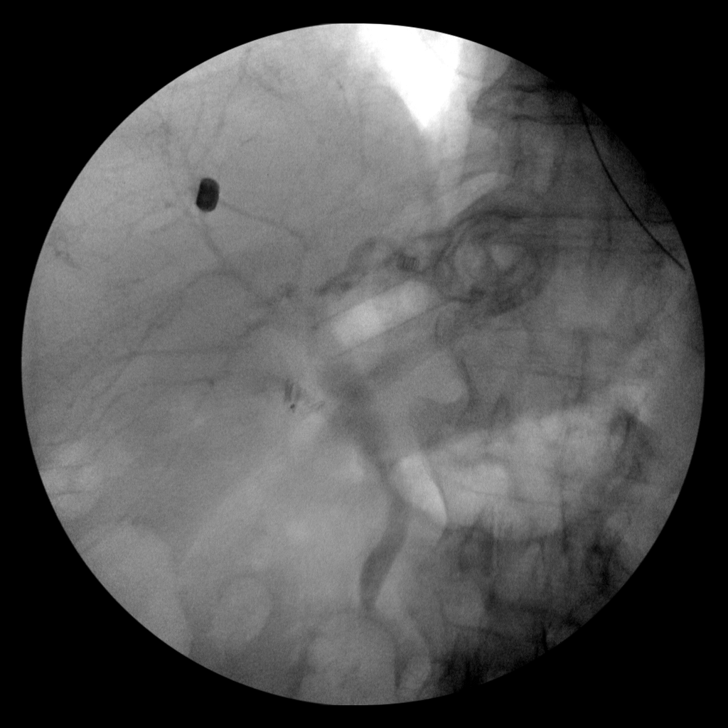
[im 2/2]
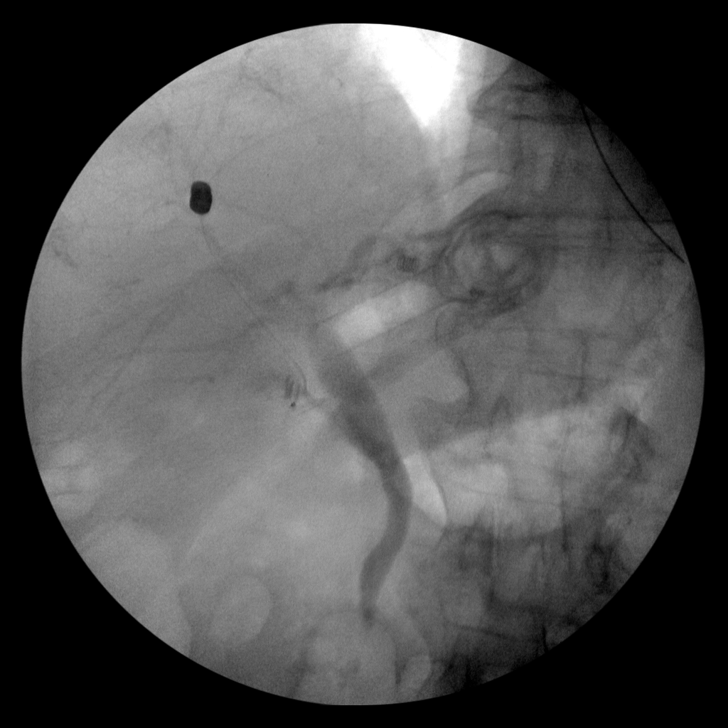
[im 2/2]
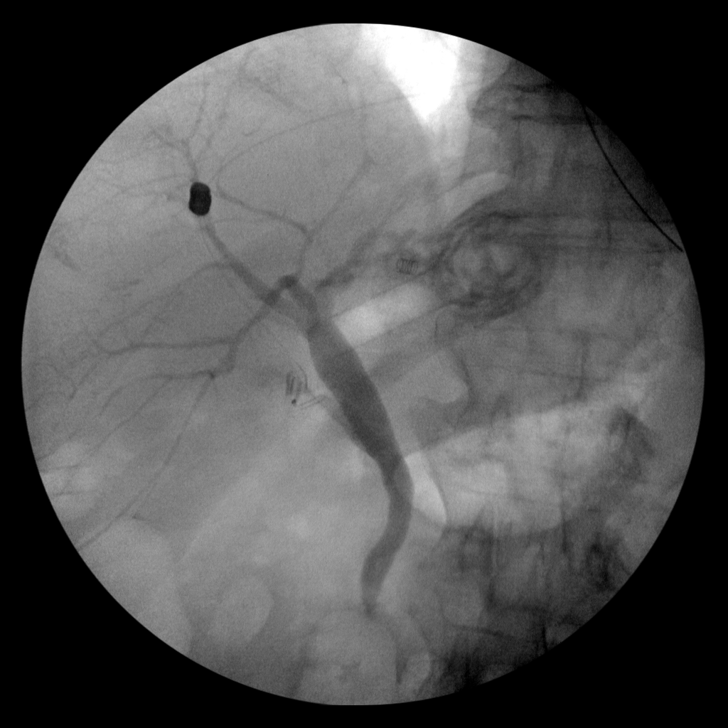
[im 2/2]
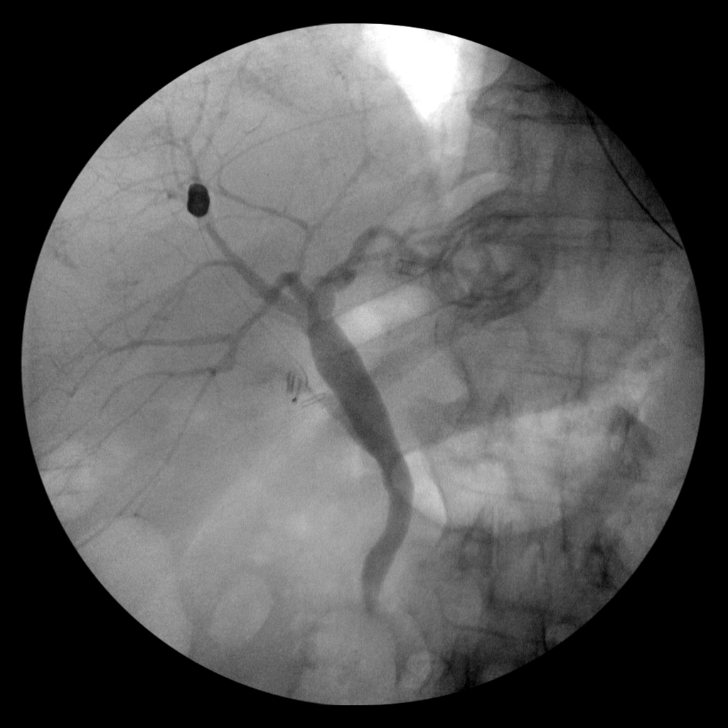

[8 of 8 positions shown; findings below may reference images not displayed]

FINDINGS: Intraoperative cholangiographic images of the right upper abdominal
quadrant during laparoscopic cholecystectomy are provided for
review.

Surgical clips overlie the expected location of the gallbladder
fossa.

Contrast injection demonstrates selective cannulation of the central
aspect of the cystic duct.

There is passage of contrast through the central aspect of the
cystic duct with filling of a non dilated common bile duct. There is
passage of contrast though the CBD and into the descending portion
of the duodenum.

There is minimal reflux of injected contrast into the common hepatic
duct and central aspect of the non dilated intrahepatic biliary
system.

There are apparent persistent nonocclusive filling defects within
distal aspect of the CBD which may represent nonocclusive gallstones
versus biliary sludge.
IMPRESSION: Persistent nonocclusive filling defects within the distal aspect of
the CBD may be artifactual secondary to underdistention though
nonocclusive stones / sludge could have a similar appearance.
Correlation with the operative report is recommended.

## 2018-10-10 NOTE — Telephone Encounter (Signed)
Pt was contacted and notified to stop ASA per Candee Furbish, MD.  Pt understood instructions and medication list was updated.

## 2018-10-10 NOTE — Telephone Encounter (Signed)
-----   Message from Oliver Barre, [place] sent at 10/10/2018  8:47 AM EDT ----- Ileene Musa, could you let Ms. Tarver know that I talked to Dr Marlou Porch and she can stop her ASA. And also let her know that the device clinic hasn't sent me her transmission yet if she asks about it. Thanks.

## 2018-10-11 DIAGNOSIS — J441 Chronic obstructive pulmonary disease with (acute) exacerbation: Secondary | ICD-10-CM | POA: Diagnosis not present

## 2018-10-11 DIAGNOSIS — Z6837 Body mass index (BMI) 37.0-37.9, adult: Secondary | ICD-10-CM | POA: Diagnosis not present

## 2018-10-11 DIAGNOSIS — J019 Acute sinusitis, unspecified: Secondary | ICD-10-CM | POA: Diagnosis not present

## 2018-10-13 DIAGNOSIS — M5481 Occipital neuralgia: Secondary | ICD-10-CM | POA: Diagnosis not present

## 2018-10-13 DIAGNOSIS — M542 Cervicalgia: Secondary | ICD-10-CM | POA: Diagnosis not present

## 2018-10-28 DIAGNOSIS — G4733 Obstructive sleep apnea (adult) (pediatric): Secondary | ICD-10-CM | POA: Diagnosis not present

## 2018-11-14 ENCOUNTER — Encounter (HOSPITAL_COMMUNITY): Payer: Self-pay | Admitting: Physician Assistant

## 2018-11-14 ENCOUNTER — Other Ambulatory Visit: Payer: Self-pay

## 2018-11-14 ENCOUNTER — Other Ambulatory Visit (HOSPITAL_COMMUNITY): Payer: Self-pay | Admitting: *Deleted

## 2018-11-14 ENCOUNTER — Ambulatory Visit (HOSPITAL_COMMUNITY)
Admission: RE | Admit: 2018-11-14 | Discharge: 2018-11-14 | Disposition: A | Discharge status: Home / Self Care | Payer: Medicare HMO | Source: Ambulatory Visit | Attending: Physician Assistant | Admitting: Physician Assistant

## 2018-11-14 VITALS — BP 152/60 | HR 71 | Ht 65.0 in | Wt 234.8 lb

## 2018-11-14 DIAGNOSIS — I480 Paroxysmal atrial fibrillation: Secondary | ICD-10-CM

## 2018-11-14 DIAGNOSIS — Z794 Long term (current) use of insulin: Secondary | ICD-10-CM | POA: Insufficient documentation

## 2018-11-14 DIAGNOSIS — E119 Type 2 diabetes mellitus without complications: Secondary | ICD-10-CM | POA: Diagnosis not present

## 2018-11-14 DIAGNOSIS — Z6839 Body mass index (BMI) 39.0-39.9, adult: Secondary | ICD-10-CM | POA: Insufficient documentation

## 2018-11-14 DIAGNOSIS — Z87891 Personal history of nicotine dependence: Secondary | ICD-10-CM | POA: Insufficient documentation

## 2018-11-14 DIAGNOSIS — I5032 Chronic diastolic (congestive) heart failure: Secondary | ICD-10-CM | POA: Diagnosis not present

## 2018-11-14 DIAGNOSIS — E669 Obesity, unspecified: Secondary | ICD-10-CM | POA: Diagnosis not present

## 2018-11-14 DIAGNOSIS — Z7901 Long term (current) use of anticoagulants: Secondary | ICD-10-CM | POA: Insufficient documentation

## 2018-11-14 DIAGNOSIS — E039 Hypothyroidism, unspecified: Secondary | ICD-10-CM | POA: Insufficient documentation

## 2018-11-14 DIAGNOSIS — G4733 Obstructive sleep apnea (adult) (pediatric): Secondary | ICD-10-CM | POA: Diagnosis not present

## 2018-11-14 DIAGNOSIS — E785 Hyperlipidemia, unspecified: Secondary | ICD-10-CM | POA: Insufficient documentation

## 2018-11-14 DIAGNOSIS — M1990 Unspecified osteoarthritis, unspecified site: Secondary | ICD-10-CM | POA: Diagnosis not present

## 2018-11-14 DIAGNOSIS — I110 Hypertensive heart disease with heart failure: Secondary | ICD-10-CM | POA: Diagnosis not present

## 2018-11-14 DIAGNOSIS — F419 Anxiety disorder, unspecified: Secondary | ICD-10-CM | POA: Insufficient documentation

## 2018-11-14 DIAGNOSIS — I2510 Atherosclerotic heart disease of native coronary artery without angina pectoris: Secondary | ICD-10-CM | POA: Insufficient documentation

## 2018-11-14 DIAGNOSIS — Z79899 Other long term (current) drug therapy: Secondary | ICD-10-CM | POA: Diagnosis not present

## 2018-11-14 LAB — CBC
HCT: 37 % (ref 36.0–46.0)
Hemoglobin: 11.2 g/dL — ABNORMAL LOW (ref 12.0–15.0)
MCH: 28.3 pg (ref 26.0–34.0)
MCHC: 30.3 g/dL (ref 30.0–36.0)
MCV: 93.4 fL (ref 80.0–100.0)
Platelets: 426 10*3/uL — ABNORMAL HIGH (ref 150–400)
RBC: 3.96 MIL/uL (ref 3.87–5.11)
RDW: 14 % (ref 11.5–15.5)
WBC: 9.4 10*3/uL (ref 4.0–10.5)
nRBC: 0 % (ref 0.0–0.2)

## 2018-11-14 LAB — BASIC METABOLIC PANEL
Anion gap: 10 (ref 5–15)
BUN: 21 mg/dL (ref 8–23)
CO2: 25 mmol/L (ref 22–32)
Calcium: 8.7 mg/dL — ABNORMAL LOW (ref 8.9–10.3)
Chloride: 103 mmol/L (ref 98–111)
Creatinine, Ser: 1.38 mg/dL — ABNORMAL HIGH (ref 0.44–1.00)
GFR calc Af Amer: 43 mL/min — ABNORMAL LOW (ref >60)
GFR calc non Af Amer: 37 mL/min — ABNORMAL LOW (ref >60)
Glucose, Bld: 185 mg/dL — ABNORMAL HIGH (ref 70–99)
Potassium: 4.3 mmol/L (ref 3.5–5.1)
Sodium: 138 mmol/L (ref 135–145)

## 2018-11-14 MED ORDER — APIXABAN 5 MG PO TABS: 5.0000 mg | ORAL_TABLET | Freq: Two times a day (BID) | ORAL | 2 refills | Status: DC

## 2018-11-14 NOTE — Progress Notes (Signed)
Primary Care Physician: Isaias Sakai, DO Primary Cardiologist: Dr Marlou Porch Primary Electrophysiologist: Dr Caryl Comes Referring Physician: Dr Chriss Czar is a 78 y.o. female with a history of paroxysmal atrial fibrillation, CAD, COPD, tobacco abuse, DM, GERD, HTN, hypothyroidism, and OSA who presents for follow up in the [facility]. Patient noted to have paroxysmal atrial fibrillation noted on remote device transmission. Longest episode was about 7 days. Ventricular rates appear controlled. Patient states that she "thought everything was going great" until the device clinic called. She does report that she may have increase SOB over the last few weeks but this is improving. She has only had brief, infrequent palpitations lasting 4-5 minutes. There were no specific triggers that the patient could identify.  On follow up today, patient reports that she has done reasonably well since her last visit. She does not appear to have any awareness of her arrhythmia besides brief palpitations which occur very infrequently. She is tolerating the Eliquis well with no bleeding issues. Her SOB is at baseline.   Today, she denies symptoms of chest pain, orthopnea, PND, lower extremity edema, dizziness, presyncope, syncope, snoring, daytime somnolence, bleeding, or neurologic sequela. The patient is tolerating medications without difficulties and is otherwise without complaint today.    Atrial Fibrillation Risk Factors:  she does have symptoms or diagnosis of sleep apnea. she is compliant with CPAP therapy.   she has a BMI of Body mass index is 39.07 kg/m.Marland Kitchen Filed Weights   11/14/18 0857  Weight: 106.5 kg    Family History  Problem Relation Age of Onset  . Heart Problems Mother   . Heart defect Mother        valve problems  . Heart Problems Father   . Heart disease Brother   . Lung cancer Daughter        non small cell   . Diabetes Maternal Aunt    . Diabetes Maternal Uncle   . Throat cancer Brother   . Colon cancer Neg Hx   . Colon polyps Neg Hx   . Esophageal cancer Neg Hx   . Rectal cancer Neg Hx   . Stomach cancer Neg Hx      Atrial Fibrillation Management history:  Previous antiarrhythmic drugs: none Previous cardioversions: none Previous ablations: none CHADS2VASC score: 6 Anticoagulation history: Eliquis    Past Medical History:  Diagnosis Date  . Allergy   . Anxiety   . Arthritis   . CAD (coronary artery disease)    Mild per remote cath in 2006, /    Nuclear, January, 2013, no significant abnormality,  . Carotid artery disease ([place])    Doppler, January, 2013, 0 71% RIC A., 06-26% LICA, stable  . Cataract   . Complication of anesthesia    wakes up "mean"  . COPD (chronic obstructive pulmonary disease) ([place])   . Diabetes mellitus, type 2 ([place])   . Diverticula of colon   . Dizziness    occasional  . Ejection fraction   . GERD (gastroesophageal reflux disease)   . Goiter    hx of  . Headache(784.0)    migraine  . Hyperlipidemia   . Hypertension   . Hypothyroidism   . Left ventricular hypertrophy   . Leg edema    occasional swelling  . Obesity   . OSA (obstructive sleep apnea)    cpap setting of 12  . PONV (postoperative nausea and vomiting)    severe after every  surgery  . Pulmonary hypertension, secondary   . Sleep apnea    Past Surgical History:  Procedure Laterality Date  . ABDOMINAL HYSTERECTOMY     with appendectomy and colon polypectomy  . ANTERIOR CERVICAL DECOMP/DISCECTOMY FUSION N/A 11/07/2014   Procedure: Anterior Cervical diskectomy and fusion Cervical four-five, Cervical five-six, Cervical six-seven;  Surgeon: Kary Kos, MD;  Location: [facility];  Service: Neurosurgery;  Laterality: N/A;  . APPENDECTOMY    . ARTERY BIOPSY Right 02/16/2017   Procedure: BIOPSY TEMPORAL ARTERY;  Surgeon: Katha Cabal, MD;  Location: [facility];  Service: Vascular;  Laterality: Right;  . BACK  SURGERY    . CARDIAC CATHETERIZATION    . CATARACT EXTRACTION W/ INTRAOCULAR LENS  IMPLANT, BILATERAL Bilateral   . CHOLECYSTECTOMY N/A 04/29/2016   Procedure: LAPAROSCOPIC CHOLECYSTECTOMY WITH INTRAOPERATIVE CHOLANGIOGRAM;  Surgeon: Alphonsa Overall, MD;  Location: [facility];  Service: General;  Laterality: N/A;  . EP IMPLANTABLE DEVICE N/A 11/08/2015   Procedure: Pacemaker Implant;  Surgeon: Deboraha Sprang, MD;  Location: [facility];  Service: Cardiovascular;  Laterality: N/A;  . INSERT / REPLACE / REMOVE PACEMAKER  2017   Dr. Jens Som [facility])  . JOINT REPLACEMENT    . LUMBAR DISC SURGERY  1990's   x 2  . THYROIDECTOMY  2011  . TOTAL KNEE ARTHROPLASTY  07/27/2011   Procedure: TOTAL KNEE ARTHROPLASTY;  Surgeon: Mauri Pole, MD;  Location: [facility];  Service: Orthopedics;  Laterality: Left;  . UPPER ESOPHAGEAL ENDOSCOPIC ULTRASOUND (EUS) N/A 05/14/2016   Procedure: UPPER ESOPHAGEAL ENDOSCOPIC ULTRASOUND (EUS);  Surgeon: Milus Banister, MD;  Location: [facility];  Service: Endoscopy;  Laterality: N/A;  radial only-will be done mod if needed    Current Outpatient Medications  Medication Sig Dispense Refill  . acetaminophen (TYLENOL) 500 MG tablet Take 1,000 mg by mouth every 4 (four) hours as needed for mild pain or moderate pain.     Marland Kitchen albuterol (PROVENTIL) (2.5 MG/3ML) 0.083% nebulizer solution Take 2.5 mg by nebulization every 6 (six) hours as needed for wheezing or shortness of breath.    Marland Kitchen albuterol (VENTOLIN HFA) 108 (90 Base) MCG/ACT inhaler Inhale 2 puffs into the lungs every 6 (six) hours as needed for wheezing or shortness of breath. 3 Inhaler 3  . amLODipine (NORVASC) 10 MG tablet Take 10 mg by mouth daily.    Marland Kitchen apixaban (ELIQUIS) 5 MG TABS tablet Take 1 tablet (5 mg total) by mouth 2 (two) times daily. 60 tablet 3  . atorvastatin (LIPITOR) 40 MG tablet Take 1 tablet (40 mg total) by mouth daily. 90 tablet 3  . cholecalciferol (VITAMIN D) 1000 units tablet Take 1,000 Units by  mouth daily.    . fluticasone (FLONASE) 50 MCG/ACT nasal spray USE 2 SPRAYS NASALLY DAILY 48 g 1  . furosemide (LASIX) 40 MG tablet Take 1 tablet (40 mg total) by mouth daily. 90 tablet 3  . gabapentin (NEURONTIN) 800 MG tablet Take 800 mg by mouth 3 (three) times daily.     Marland Kitchen HYDROcodone-acetaminophen (NORCO/VICODIN) 5-325 MG tablet Take 1-2 tablets by mouth every 6 (six) hours as needed for moderate pain. 30 tablet 0  . insulin aspart (NOVOLOG FLEXPEN) 100 UNIT/ML FlexPen Inject 6-10 Units into the skin 3 (three) times daily as needed for high blood sugar (CBG >130).    . insulin glargine (LANTUS) 100 UNIT/ML injection Inject 100 Units into the skin 2 (two) times daily.     Marland Kitchen levothyroxine (SYNTHROID) 125 MCG  tablet Take 125 mcg by mouth daily before breakfast.    . LORazepam (ATIVAN) 2 MG tablet Take 2 mg by mouth at bedtime as needed for anxiety (depression).     Marland Kitchen losartan (COZAAR) 100 MG tablet Take 100 mg by mouth daily.     . metoprolol tartrate (LOPRESSOR) 25 MG tablet Take 50 mg in the morning and 25 mg at night    . promethazine (PHENADOZ) 25 MG suppository Place 25 mg rectally daily as needed (headache).    . SUMAtriptan (IMITREX) 25 MG tablet Take 25 mg by mouth See admin instructions. Take one tablet (25 mg) by mouth at onset of migraine headache. May repeat in 2 hours if needed.    . umeclidinium bromide (INCRUSE ELLIPTA) 62.5 MCG/INH AEPB Inhale 1 puff into the lungs daily. 90 each 3  . vitamin B-12 (CYANOCOBALAMIN) 1000 MCG tablet Take 1,000 mcg by mouth daily.    . nitroGLYCERIN (NITROSTAT) 0.4 MG SL tablet Place 1 tablet (0.4 mg total) under the tongue every 5 (five) minutes as needed for chest pain. (Patient not taking: Reported on 10/07/2018) 25 tablet 3  . Phenylephrine-Chlorphen-DM (TUSS-DM PO) Take by mouth.    Marland Kitchen PRESCRIPTION MEDICATION Inhale into the lungs at bedtime. CPAP    . TRUE METRIX BLOOD GLUCOSE TEST test strip      Current Facility-Administered Medications   Medication Dose Route Frequency Provider Last Rate Last Dose  . 0.9 %  sodium chloride infusion  500 mL Intravenous Continuous Milus Banister, MD        No Known Allergies  Social History   Socioeconomic History  . Marital status: Married    Spouse name: Jeneen Rinks  . Number of children: 3  . Years of education: 93  . Highest education level: Not on file  Occupational History  . Occupation: retired  Scientific laboratory technician  . Financial resource strain: Not on file  . Food insecurity    Worry: Not on file    Inability: Not on file  . Transportation needs    Medical: Not on file    Non-medical: Not on file  Tobacco Use  . Smoking status: Former Smoker    Packs/day: 1.50    Years: 34.00    Pack years: 51.00    Types: Cigarettes    Quit date: 05/04/1997    Years since quitting: 21.5  . Smokeless tobacco: Never Used  Substance and Sexual Activity  . Alcohol use: No  . Drug use: No  . Sexual activity: Not on file  Lifestyle  . Physical activity    Days per week: Not on file    Minutes per session: Not on file  . Stress: Not on file  Relationships  . Social Herbalist on phone: Not on file    Gets together: Not on file    Attends religious service: Not on file    Active member of club or organization: Not on file    Attends meetings of clubs or organizations: Not on file    Relationship status: Not on file  . Intimate partner violence    Fear of current or ex partner: Not on file    Emotionally abused: Not on file    Physically abused: Not on file    Forced sexual activity: Not on file  Other Topics Concern  . Not on file  Social History Narrative   Patient lives at home with her husband Jeneen Rinks .   Retired.   Education  12 th grade.   Right handed   Caffeine two cups of coffee.     ROS- All systems are reviewed and negative except as per the HPI above.  Physical Exam: Vitals:   11/14/18 0857  BP: (!) 152/60  Pulse: 71  Weight: 106.5 kg  Height: 5\' 5"  (1.651  m)    GEN- The patient is well appearing obese, elderly female, alert and oriented x 3 today.   Head- normocephalic, atraumatic Eyes-  Sclera clear, conjunctiva pink Ears- hearing intact Oropharynx- clear Neck- supple  Lungs- Clear to ausculation bilaterally, diminished breath sounds, normal work of breathing Heart- Regular rate and rhythm, no murmurs, rubs or gallops  GI- soft, NT, ND, + BS Extremities- no clubbing, cyanosis, or edema MS- no significant deformity or atrophy Skin- no rash or lesion Psych- euthymic mood, full affect Neuro- strength and sensation are intact  Wt Readings from Last 3 Encounters:  11/14/18 106.5 kg  06/13/18 102.4 kg  12/28/17 108.9 kg    EKG today demonstrates A paced with prolonged AV conduction. PR 250, QRS 70, QTc 419  Echo 07/31/13 demonstrated  - Left ventricle: The cavity size was normal. Wall thickness  was increased in a pattern of mild LVH. There was mild  focal basal hypertrophy of the septum. Systolic function  was normal. The estimated ejection fraction was in the  range of 55% to 60%. Wall motion was normal; there were no  regional wall motion abnormalities. Doppler parameters are  consistent with abnormal left ventricular relaxation  (grade 1 diastolic dysfunction).  - Left atrium: The atrium was mildly dilated.  - Atrial septum: There was increased thickness of the  septum, consistent with lipomatous hypertrophy.   Epic records are reviewed at length today  Assessment and Plan:  1. Paroxysmal atrial fibrillation The patient has paroxysmal atrial fibrillation. Patient appears to be doing well with no symptoms. We discussed therapeutic options today and given pt's advanced age, comorbidities, and paucity of symptoms, will pursue a conservative strategy for now. Continue Lopressor 50 mg AM, 25 mg PM. Continue Eliquis 5 mg BID Bmet/CBC today.  This patients CHA2DS2-VASc Score and unadjusted Ischemic Stroke Rate  (% per year) is equal to 9.7 % stroke rate/year from a score of 6  Above score calculated as 1 point each if present [CHF, HTN, DM, Vascular=MI/PAD/Aortic Plaque, Age if 65-74, or Female] Above score calculated as 2 points each if present [Age > 75, or Stroke/TIA/TE]   2. Obesity Body mass index is 39.07 kg/m. Lifestyle modification was discussed at length including regular exercise and weight reduction.  3. Obstructive sleep apnea The importance of adequate treatment of sleep apnea was discussed today in order to improve our ability to maintain sinus rhythm long term. Patient compliant with CPAP therapy.   4. CAD Cardiac CTA 12/31/17 with diffuse plaque with moderate disease in RCA and LAD. FFR showed stenosis in distal OM2. No anginal symptoms. Continue present therapy and risk factor modification.   5. Chronic diastolic CHF No symptoms of fluid overload. Weight stable. Continue present therapy.    6. HTN Borderline today. We discussed lifestyle changes as above. Pt to keep BP log for review at next visit.  7. Sinus node dysfunction S/p PPM, followed by device clinic and Dr Caryl Comes.    Follow up with Dr Caryl Comes as scheduled.   [facility] [address] [place], [place] [address] [phone] 11/14/2018 9:54 AM

## 2018-11-17 ENCOUNTER — Ambulatory Visit: Payer: Medicare HMO | Admitting: Pulmonary Disease

## 2018-11-17 ENCOUNTER — Other Ambulatory Visit: Payer: Self-pay

## 2018-11-17 ENCOUNTER — Encounter: Payer: Self-pay | Admitting: Pulmonary Disease

## 2018-11-17 VITALS — BP 148/56 | HR 100 | Temp 98.3°F | Ht 65.0 in | Wt 234.0 lb

## 2018-11-17 DIAGNOSIS — G4733 Obstructive sleep apnea (adult) (pediatric): Secondary | ICD-10-CM

## 2018-11-17 DIAGNOSIS — Z9989 Dependence on other enabling machines and devices: Secondary | ICD-10-CM | POA: Diagnosis not present

## 2018-11-17 DIAGNOSIS — J441 Chronic obstructive pulmonary disease with (acute) exacerbation: Secondary | ICD-10-CM | POA: Diagnosis not present

## 2018-11-17 DIAGNOSIS — J449 Chronic obstructive pulmonary disease, unspecified: Secondary | ICD-10-CM | POA: Diagnosis not present

## 2018-11-17 MED ORDER — PREDNISONE 10 MG PO TABS: ORAL_TABLET | ORAL | 0 refills | Status: DC

## 2018-11-17 MED ORDER — PREDNISONE 10 MG PO TABS: ORAL_TABLET | ORAL | 0 refills | Status: AC

## 2018-11-17 MED ORDER — TRELEGY ELLIPTA 100-62.5-25 MCG/INH IN AEPB: 1.0000 | INHALATION_SPRAY | Freq: Every day | RESPIRATORY_TRACT | 0 refills | Status: DC

## 2018-11-17 MED ORDER — TRELEGY ELLIPTA 100-62.5-25 MCG/INH IN AEPB: 1.0000 | INHALATION_SPRAY | Freq: Every day | RESPIRATORY_TRACT | 3 refills | Status: DC

## 2018-11-17 NOTE — Patient Instructions (Signed)
Prednisone 10 mg pill >> 3 pills daily for 2 days, 2 pills daily for 2 days, 1 pill daily for 2 days  Trelegy one puff daily, and rinse mouth after each use  Stop using incruse after you start trelegy  Follow up in 6 months

## 2018-11-17 NOTE — Addendum Note (Signed)
Addended by: Amado Coe on: 11/17/2018 11:19 AM   Modules accepted: Orders

## 2018-11-17 NOTE — Progress Notes (Signed)
[facility]  Chief Complaint  Patient presents with  . Follow-up    CPAP and medication (incruse & symbicort)    Constitutional:  BP (!) 148/56 (BP Location: Right Wrist, Cuff Size: Normal)   Pulse 100   Temp 98.3 F (36.8 C) (Oral)   Ht 5\' 5"  (1.651 m)   Wt 234 lb (106.1 kg)   SpO2 94%   BMI 38.94 kg/m   Past Medical History:  Hypothyroidism s/p thyroidectomy, HTN, HLD, HA, GERD, Diverticulosis, DM, Cataract, CAD, OA, Anxiety, Allergies  Brief Summary:  Kayla [name] is a 78 y.o. female former smoker with COPD and obstructive sleep apnea.  She has been off symbicort.  Her cough has gotten worse over the past several weeks.  She is using incruse.  She brings up clear sputum and gets intermittent wheeze.  No fever, sinus congestion, sore throat, or hemoptysis.  She was tx with prednisone by PCP and this helped.  Uses CPAP nightly w/o difficulty.  Physical Exam:   Appearance - well kempt, intermittent couging episodes.  ENMT - clear nasal mucosa, midline nasal  septum, no oral exudates, no LAN, trachea midline  Respiratory - normal chest wall, normal respiratory effort, no accessory muscle use, no wheeze/rales  CV - s1s2 regular rate and rhythm, no murmurs, no peripheral edema, radial pulses symmetric  GI - soft, non tender, no masses  Lymph - no adenopathy noted in neck and axillary areas  MSK - normal gait  Ext - no cyanosis, clubbing, or joint inflammation noted  Skin - no rashes, lesions, or ulcers  Neuro - normal strength, oriented x 3  Psych - normal mood and affect  Assessment/Plan:   COPD with chronic bronchitis. - she has exacerbation - don't think she needs ABx at this time - will give her course of prednisone - will change her from incruse to trelegy - demonstrated inhaler technique - prn albuterol - if symptoms persist, then might need ABx and imaging studies - intolerant of daliresp previously    Obstructive sleep apnea. - she is compliant with CPAP and reports benefit - continue auto CPAP  Obesity. - discussed importance of weight loss  Patient Instructions  Prednisone 10 mg pill >> 3 pills daily for 2 days, 2 pills daily for 2 days, 1 pill daily for 2 days  Trelegy one puff daily, and rinse mouth after each use  Stop using incruse after you start trelegy  Follow up in 6 months  A total of  26 minutes were spent face to face with the patient and more than half of that time involved counseling or coordination of care.   Chesley Mires, MD [facility] Pager: [phone] 11/17/2018, 10:50 AM  Flow Sheet     Pulmonary tests:  Spirometry 10/19/08 >>FEV1 1.55 (68%), FEV1% 70  Spirometry 08/29/12 >> FEV1 1.38 (59%), FEV1% 58 FeNO 03/09/16 >> 7  Sleep tests:  PSG 02/2005 >>AHI 10  CPAP titration 02/19/14 >> CPAP 11 cm H2O >> AHI 0.7, PLMI 81.4, did not need supplemental oxygen Auto CPAP 10/17/18 to 11/15/18 >> used on 30 of 30 nights with average 9 hrs 28 min.  Average AHI 1.2 with median CPAP 6 and 95 th percentile CPAP 10 cm H2O  Cardiac tests:  Echo 07/31/13 >> mild LVH, EF 55 to 60%, grade 1 DD   Medications:   Allergies as of 11/17/2018   No Known Allergies     Medication List  Accurate as of November 17, 2018 10:50 AM. If you have any questions, ask your nurse or doctor.        acetaminophen 500 MG tablet Commonly known as: TYLENOL Take 1,000 mg by mouth every 4 (four) hours as needed for mild pain or moderate pain.   albuterol (2.5 MG/3ML) 0.083% nebulizer solution Commonly known as: PROVENTIL Take 2.5 mg by nebulization every 6 (six) hours as needed for wheezing or shortness of breath.   albuterol 108 (90 Base) MCG/ACT inhaler Commonly known as: Ventolin HFA Inhale 2 puffs into the lungs every 6 (six) hours as needed for wheezing or shortness of breath.   amLODipine 10 MG tablet Commonly known as: NORVASC Take 10 mg by  mouth daily.   apixaban 5 MG Tabs tablet Commonly known as: Eliquis Take 1 tablet (5 mg total) by mouth 2 (two) times daily.   atorvastatin 40 MG tablet Commonly known as: LIPITOR Take 1 tablet (40 mg total) by mouth daily.   cholecalciferol 1000 units tablet Commonly known as: VITAMIN D Take 1,000 Units by mouth daily.   fluticasone 50 MCG/ACT nasal spray Commonly known as: FLONASE USE 2 SPRAYS NASALLY DAILY   furosemide 40 MG tablet Commonly known as: LASIX Take 1 tablet (40 mg total) by mouth daily.   gabapentin 800 MG tablet Commonly known as: NEURONTIN Take 800 mg by mouth 3 (three) times daily.   HYDROcodone-acetaminophen 5-325 MG tablet Commonly known as: NORCO/VICODIN Take 1-2 tablets by mouth every 6 (six) hours as needed for moderate pain.   insulin glargine 100 UNIT/ML injection Commonly known as: LANTUS Inject 100 Units into the skin 2 (two) times daily.   levothyroxine 125 MCG tablet Commonly known as: SYNTHROID Take 125 mcg by mouth daily before breakfast.   LORazepam 2 MG tablet Commonly known as: ATIVAN Take 2 mg by mouth at bedtime as needed for anxiety (depression).   losartan 100 MG tablet Commonly known as: COZAAR Take 100 mg by mouth daily.   metoprolol tartrate 25 MG tablet Commonly known as: LOPRESSOR Take 50 mg in the morning and 25 mg at night   nitroGLYCERIN 0.4 MG SL tablet Commonly known as: NITROSTAT Place 1 tablet (0.4 mg total) under the tongue every 5 (five) minutes as needed for chest pain.   NovoLOG FlexPen 100 UNIT/ML FlexPen Generic drug: insulin aspart Inject 6-10 Units into the skin 3 (three) times daily as needed for high blood sugar (CBG >130).   Phenadoz 25 MG suppository Generic drug: promethazine Place 25 mg rectally daily as needed (headache).   predniSONE 10 MG tablet Commonly known as: DELTASONE Take 3 tablets (30 mg total) by mouth daily with breakfast for 2 days, THEN 2 tablets (20 mg total) daily with  breakfast for 2 days, THEN 1 tablet (10 mg total) daily with breakfast for 2 days. Start taking on: November 17, 2018 Started by: Chesley Mires, MD   PRESCRIPTION MEDICATION Inhale into the lungs at bedtime. CPAP   SUMAtriptan 25 MG tablet Commonly known as: IMITREX Take 25 mg by mouth See admin instructions. Take one tablet (25 mg) by mouth at onset of migraine headache. May repeat in 2 hours if needed.   Trelegy Ellipta 100-62.5-25 MCG/INH Aepb Generic drug: Fluticasone-Umeclidin-Vilant Inhale 1 puff into the lungs daily. Started by: Chesley Mires, MD   True Metrix Blood Glucose Test test strip Generic drug: glucose blood   TUSS-DM PO Take by mouth.   umeclidinium bromide 62.5 MCG/INH Aepb Commonly known as: Incruse Ellipta  Inhale 1 puff into the lungs daily.   vitamin B-12 1000 MCG tablet Commonly known as: CYANOCOBALAMIN Take 1,000 mcg by mouth daily.       Past Surgical History:  She  has a past surgical history that includes Thyroidectomy (2011); Total knee arthroplasty (07/27/2011); Abdominal hysterectomy; Anterior cervical decomp/discectomy fusion (N/A, 11/07/2014); Cardiac catheterization (N/A, 11/08/2015); Lumbar disc surgery (1990's); Cholecystectomy (N/A, 04/29/2016); Upper esophageal endoscopic ultrasound (eus) (N/A, 05/14/2016); Cataract extraction w/ intraocular lens  implant, bilateral (Bilateral); Insert / replace / remove pacemaker (2017); Cardiac catheterization; Back surgery; Appendectomy; Joint replacement; and Artery Biopsy (Right, 02/16/2017).  Family History:  Her family history includes Diabetes in her maternal aunt and maternal uncle; Heart Problems in her father and mother; Heart defect in her mother; Heart disease in her brother; Lung cancer in her daughter; Throat cancer in her brother.  Social History:  She  reports that she quit smoking about 21 years ago. Her smoking use included cigarettes. She started smoking about 54 years ago. She has a 66.00 pack-year  smoking history. She has never used smokeless tobacco. She reports that she does not drink alcohol or use drugs.

## 2018-11-17 NOTE — Addendum Note (Signed)
Addended by: Amado Coe on: 11/17/2018 11:21 AM   Modules accepted: Orders

## 2018-11-27 DIAGNOSIS — G4733 Obstructive sleep apnea (adult) (pediatric): Secondary | ICD-10-CM | POA: Diagnosis not present

## 2018-11-28 ENCOUNTER — Other Ambulatory Visit: Payer: Self-pay | Admitting: Internal Medicine

## 2018-11-30 ENCOUNTER — Telehealth: Payer: Self-pay | Admitting: Pulmonary Disease

## 2018-11-30 NOTE — Telephone Encounter (Signed)
Pt returning call and can be reached @ [phone].Kayla [name]

## 2018-11-30 NOTE — Telephone Encounter (Signed)
Routing to Dr. Halford Chessman. Dr. Halford Chessman, please advise on this for pt. Thanks!

## 2018-11-30 NOTE — Telephone Encounter (Signed)
Pt is calling back [phone]

## 2018-11-30 NOTE — Telephone Encounter (Signed)
LMTCB x1 for pt.  

## 2018-11-30 NOTE — Telephone Encounter (Addendum)
Dr. Halford Chessman is here tomorrow. Please route to him. Unlikely Trelegy is causing BS elevation. That is from oral prednisone. Recommend stopping and resuming Incruse until we hear back from Dr. Halford Chessman. If symptoms worsen contact PCP or ED for blurred vision and dizziness.

## 2018-11-30 NOTE — Telephone Encounter (Signed)
Primary Pulmonologist: VS Last office visit and with whom: 11/17/2018 with VS What do we see them for (pulmonary problems): OSA/COPD Last OV assessment/plan:  Assessment/Plan:   COPD with chronic bronchitis. - she has exacerbation - don't think she needs ABx at this time - will give her course of prednisone - will change her from incruse to trelegy - demonstrated inhaler technique - prn albuterol - if symptoms persist, then might need ABx and imaging studies - intolerant of daliresp previously   Obstructive sleep apnea. - she is compliant with CPAP and reports benefit - continue auto CPAP  Obesity. - discussed importance of weight loss  Patient Instructions  Prednisone 10 mg pill >> 3 pills daily for 2 days, 2 pills daily for 2 days, 1 pill daily for 2 days  Trelegy one puff daily, and rinse mouth after each use  Stop using incruse after you start trelegy  Follow up in 6 months  A total of  26 minutes were spent face to face with the patient and more than half of that time involved counseling or coordination of care.  Was appointment offered to patient (explain)?  Pt wants recommendations   Reason for call: Called and spoke with pt. Pt said she was prescribed Trelegy at last OV with VS 7/16. Pt had been on Incruse prior to the Trelegy.  Pt said she has now been on Trelegy x10 days but 4 days ago, pt began to have blurred vision, some dizziness, and blood sugar spikes over 300.  I looked at pt's last OV with VS and saw that she was put on a course of prednisone and pt said that she did take prednisone x 5 days and while she was on the prednisone, she did closely monitor her sugars and even took a little more insulin due to being on the prednisone. Pt said while on the prednisone, her sugars never went that high.  Pt said that she has not used the Trelegy today and since not using it today, she has not had any problems with her blood sugars and also states she is  doing okay. Pt has not used any inhaler as she still had Incruse from being on it prior to being switched to the Trelegy.  Pt wanted to know if she should fully stop the Trelegy and go back on the Incruse or what is recommended to help with this.  Since VS was only in clinic this morning, routing to APP of the day. Beth, please advise on this for pt. Thanks!

## 2018-12-01 NOTE — Telephone Encounter (Signed)
Called and spoke with pt letting her know the info stated by VS and that he said it is okay for her to resume the Trelegy. Stated to pt if she began having any problems again once resuming the Trelegy to call our office back and we would then rediscuss this with VS. Pt verbalized understanding. Nothing further needed.

## 2018-12-01 NOTE — Telephone Encounter (Signed)
Please let her know that the blurred vision she was experiencing was related to blood sugar spikes from being on prednisone.  These should resolve once she is finished taking prednisone.  These are not related to trelegy.  She can resume using trelegy one puff daily.

## 2018-12-02 ENCOUNTER — Ambulatory Visit: Payer: Medicare HMO | Admitting: Pulmonary Disease

## 2018-12-06 DIAGNOSIS — I890 Lymphedema, not elsewhere classified: Secondary | ICD-10-CM | POA: Diagnosis not present

## 2018-12-06 DIAGNOSIS — E1149 Type 2 diabetes mellitus with other diabetic neurological complication: Secondary | ICD-10-CM | POA: Diagnosis not present

## 2018-12-06 DIAGNOSIS — E1165 Type 2 diabetes mellitus with hyperglycemia: Secondary | ICD-10-CM | POA: Diagnosis not present

## 2018-12-06 DIAGNOSIS — Z6839 Body mass index (BMI) 39.0-39.9, adult: Secondary | ICD-10-CM | POA: Diagnosis not present

## 2018-12-08 ENCOUNTER — Telehealth: Payer: Self-pay | Admitting: Pulmonary Disease

## 2018-12-08 DIAGNOSIS — I890 Lymphedema, not elsewhere classified: Secondary | ICD-10-CM | POA: Diagnosis not present

## 2018-12-08 NOTE — Telephone Encounter (Signed)
Called spoke with patient she wants to be seen by someone who can help her with her reaction to Trelegy. Patient called 7/30 was told to stay on trelegy but call if symptoms didn't improve. Patient is short of breath and has a dry cough. We see her for COPD she is a VS patient.  She has recently seen her PCP due to increased blood sugars, and feet swelling, but the cough is not any better.   appt made with Beth for Friday 12/09/18 if needs to be a televisit please let me know I will cal her back. Patient denies any other symptoms related to covid-19. Only a cough and some shortness of breath.   Will route to app of day for advise on in office or televisit

## 2018-12-08 NOTE — Telephone Encounter (Signed)
I think it is fine for office visit as long as no fever or flu like symptoms . This is what she saw VS for last month.   Please contact office for sooner follow up if symptoms do not improve or worsen or seek emergency care

## 2018-12-08 NOTE — Telephone Encounter (Signed)
Called and spoke with patient letting her know to come to office tomorrow but if symptoms worsen to go to the Emergency patient voiced understanding will keep appt tomorrow morning with BW at 0900   Nothing further needed at this time

## 2018-12-09 ENCOUNTER — Encounter: Payer: Self-pay | Admitting: Primary Care

## 2018-12-09 ENCOUNTER — Other Ambulatory Visit: Payer: Self-pay

## 2018-12-09 ENCOUNTER — Ambulatory Visit: Payer: Medicare HMO | Admitting: Primary Care

## 2018-12-09 VITALS — BP 138/70 | HR 66 | Temp 98.0°F | Ht 65.0 in | Wt 239.4 lb

## 2018-12-09 DIAGNOSIS — M7989 Other specified soft tissue disorders: Secondary | ICD-10-CM

## 2018-12-09 DIAGNOSIS — R0602 Shortness of breath: Secondary | ICD-10-CM | POA: Diagnosis not present

## 2018-12-09 DIAGNOSIS — J449 Chronic obstructive pulmonary disease, unspecified: Secondary | ICD-10-CM | POA: Diagnosis not present

## 2018-12-09 DIAGNOSIS — R350 Frequency of micturition: Secondary | ICD-10-CM

## 2018-12-09 HISTORY — DX: Other specified soft tissue disorders: M79.89

## 2018-12-09 HISTORY — DX: Frequency of micturition: R35.0

## 2018-12-09 LAB — URINALYSIS, ROUTINE W REFLEX MICROSCOPIC
Bilirubin Urine: NEGATIVE
Hgb urine dipstick: NEGATIVE
Ketones, ur: NEGATIVE
Leukocytes,Ua: NEGATIVE
Nitrite: NEGATIVE
RBC / HPF: NONE SEEN (ref 0–?)
Specific Gravity, Urine: 1.02 (ref 1.000–1.030)
Total Protein, Urine: 100 — AB
Urine Glucose: NEGATIVE
Urobilinogen, UA: 0.2 (ref 0.0–1.0)
pH: 7 (ref 5.0–8.0)

## 2018-12-09 LAB — BRAIN NATRIURETIC PEPTIDE: Pro B Natriuretic peptide (BNP): 121 pg/mL — ABNORMAL HIGH (ref 0.0–100.0)

## 2018-12-09 MED ORDER — BUDESONIDE-FORMOTEROL FUMARATE 160-4.5 MCG/ACT IN AERO: 2.0000 | INHALATION_SPRAY | Freq: Two times a day (BID) | RESPIRATORY_TRACT | 0 refills | Status: DC

## 2018-12-09 MED ORDER — INCRUSE ELLIPTA 62.5 MCG/INH IN AEPB: 1.0000 | INHALATION_SPRAY | Freq: Every day | RESPIRATORY_TRACT | 0 refills | Status: AC

## 2018-12-09 NOTE — Assessment & Plan Note (Signed)
-   Likely from diuretic but she has associated back pain - Check UAC&S

## 2018-12-09 NOTE — Progress Notes (Signed)
Some bacteria in urine, will wait for culture. BNP was elevated some, continue to follow up with PCP regarding her diuretic

## 2018-12-09 NOTE — Progress Notes (Signed)
@Patient  ID: Kayla [name], female    DOB: 11-Nov-1940, 78 y.o.   MRN: [id number]  Chief Complaint  Patient presents with  . Follow-up    feet swelling and DOE since beginning trelegy - saw PCP     Referring provider: Alonna Buckler*  HPI: 78 year old female, former smoker. PMH significant for COPD, OSA, CAD, HTN, CAD, GERD, DM, obesity. Patient of Dr. Halford Chessman, last seen on 11/17/18 for CPAP follow-up and COPD exacerbation. Treated with course of prednisone and change inhaler from Incruse to Trelegy.   12/09/2018 Complains of feet/leg swelling, dry cough, back pain and urinary frequency. Saw PCP and they increased lasix from 40-80mg . Feels her symptoms are related to Trelegy inhaler which she recently started 3 weeks ago. Prednisone taper did help her breathing symptoms, however, raised her BS levels. She had no problems when she was on Symbicort + Incruse. Would like [facility]scriptions sent to [facility]   No Known Allergies  Immunization History  Administered Date(s) Administered  . Influenza Split 03/05/2011, 01/08/2017, 02/10/2018  . Influenza Whole 03/18/2009, 02/03/2010, 04/03/2012  . Influenza, High Dose Seasonal PF 02/04/2016  . Influenza,inj,Quad PF,6+ Mos 02/10/2013, 03/04/2014, 03/17/2015  . Pneumococcal Polysaccharide-23 02/03/2010  . Tdap 03/04/2014  . Zoster 10/02/2013    Past Medical History:  Diagnosis Date  . Allergy   . Anxiety   . Arthritis   . CAD (coronary artery disease)    Mild per remote cath in 2006, /    Nuclear, January, 2013, no significant abnormality,  . Carotid artery disease ([place])    Doppler, January, 2013, 0 76% RIC A., 19-50% LICA, stable  . Cataract   . Complication of anesthesia    wakes up "mean"  . COPD (chronic obstructive pulmonary disease) ([place])   . Diabetes mellitus, type 2 ([place])   . Diverticula of colon   . Dizziness    occasional  . Ejection fraction   . GERD (gastroesophageal reflux disease)   . Goiter    hx of  .  Headache(784.0)    migraine  . Hyperlipidemia   . Hypertension   . Hypothyroidism   . Left ventricular hypertrophy   . Leg edema    occasional swelling  . Obesity   . OSA (obstructive sleep apnea)    cpap setting of 12  . PONV (postoperative nausea and vomiting)    severe after every surgery  . Pulmonary hypertension, secondary   . Sleep apnea     Tobacco History: Social History   Tobacco Use  Smoking Status Former Smoker  . Packs/day: 2.00  . Years: 33.00  . Pack years: 66.00  . Types: Cigarettes  . Start date: 1966  . Quit date: 05/04/1997  . Years since quitting: 21.6  Smokeless Tobacco Never Used   Counseling given: Not Answered   Outpatient Medications Prior to Visit  Medication Sig Dispense Refill  . acetaminophen (TYLENOL) 500 MG tablet Take 1,000 mg by mouth every 4 (four) hours as needed for mild pain or moderate pain.     Marland Kitchen albuterol (PROVENTIL) (2.5 MG/3ML) 0.083% nebulizer solution Take 2.5 mg by nebulization every 6 (six) hours as needed for wheezing or shortness of breath.    Marland Kitchen albuterol (VENTOLIN HFA) 108 (90 Base) MCG/ACT inhaler Inhale 2 puffs into the lungs every 6 (six) hours as needed for wheezing or shortness of breath. 3 Inhaler 3  . amLODipine (NORVASC) 10 MG tablet Take 10 mg by mouth daily.    Marland Kitchen apixaban (ELIQUIS)  5 MG TABS tablet Take 1 tablet (5 mg total) by mouth 2 (two) times daily. 180 tablet 2  . cholecalciferol (VITAMIN D) 1000 units tablet Take 1,000 Units by mouth daily.    . fluticasone (FLONASE) 50 MCG/ACT nasal spray USE 2 SPRAYS NASALLY DAILY 48 g 1  . furosemide (LASIX) 40 MG tablet TAKE 1 TABLET (40 MG TOTAL) BY MOUTH DAILY. (Patient taking differently: Take 40 mg by mouth daily. Taking two tabs now due to feet swelling) 90 tablet 0  . gabapentin (NEURONTIN) 800 MG tablet Take 800 mg by mouth 3 (three) times daily.     Marland Kitchen HYDROcodone-acetaminophen (NORCO/VICODIN) 5-325 MG tablet Take 1-2 tablets by mouth every 6 (six) hours as needed  for moderate pain. 30 tablet 0  . insulin aspart (NOVOLOG FLEXPEN) 100 UNIT/ML FlexPen Inject 6-10 Units into the skin 3 (three) times daily as needed for high blood sugar (CBG >130).    . insulin glargine (LANTUS) 100 UNIT/ML injection Inject 100 Units into the skin 2 (two) times daily.     Marland Kitchen levothyroxine (SYNTHROID) 125 MCG tablet Take 125 mcg by mouth daily before breakfast.    . LORazepam (ATIVAN) 2 MG tablet Take 2 mg by mouth at bedtime as needed for anxiety (depression).     Marland Kitchen losartan (COZAAR) 100 MG tablet Take 100 mg by mouth daily.     . metoprolol tartrate (LOPRESSOR) 25 MG tablet Take 50 mg in the morning and 25 mg at night    . PRESCRIPTION MEDICATION Inhale into the lungs at bedtime. CPAP    . SUMAtriptan (IMITREX) 25 MG tablet Take 25 mg by mouth See admin instructions. Take one tablet (25 mg) by mouth at onset of migraine headache. May repeat in 2 hours if needed.    . TRUE METRIX BLOOD GLUCOSE TEST test strip     . vitamin B-12 (CYANOCOBALAMIN) 1000 MCG tablet Take 1,000 mcg by mouth daily.    . Fluticasone-Umeclidin-Vilant (TRELEGY ELLIPTA) 100-62.5-25 MCG/INH AEPB Inhale 1 puff into the lungs daily. 90 each 3  . Fluticasone-Umeclidin-Vilant (TRELEGY ELLIPTA) 100-62.5-25 MCG/INH AEPB Inhale 1 puff into the lungs daily. 1 each 0  . atorvastatin (LIPITOR) 40 MG tablet Take 1 tablet (40 mg total) by mouth daily. 90 tablet 3  . nitroGLYCERIN (NITROSTAT) 0.4 MG SL tablet Place 1 tablet (0.4 mg total) under the tongue every 5 (five) minutes as needed for chest pain. (Patient not taking: Reported on 10/07/2018) 25 tablet 3  . Phenylephrine-Chlorphen-DM (TUSS-DM PO) Take by mouth.    . promethazine (PHENADOZ) 25 MG suppository Place 25 mg rectally daily as needed (headache).    . umeclidinium bromide (INCRUSE ELLIPTA) 62.5 MCG/INH AEPB Inhale 1 puff into the lungs daily. (Patient not taking: Reported on 12/09/2018) 90 each 3   Facility-Administered Medications Prior to Visit  Medication  Dose Route Frequency Provider Last Rate Last Dose  . 0.9 %  sodium chloride infusion  500 mL Intravenous Continuous Milus Banister, MD        Review of Systems  Review of Systems  Respiratory: Positive for cough.   Cardiovascular: Positive for leg swelling.  Genitourinary: Positive for frequency.  Musculoskeletal: Positive for back pain.    Physical Exam  BP 138/70 (BP Location: Left Arm, Patient Position: Sitting, Cuff Size: Normal)   Pulse 66   Temp 98 F (36.7 C)   Ht 5\' 5"  (1.651 m)   Wt 239 lb 6.4 oz (108.6 kg)   SpO2 97%   BMI  39.84 kg/m  Physical Exam Constitutional:      Appearance: Normal appearance.  HENT:     Right Ear: Tympanic membrane normal.     Left Ear: Tympanic membrane normal.     Nose: Nose normal.  Cardiovascular:     Rate and Rhythm: Normal rate and regular rhythm.  Pulmonary:     Effort: Pulmonary effort is normal.     Breath sounds: Normal breath sounds.  Musculoskeletal: Normal range of motion.  Skin:    General: Skin is warm and dry.  Neurological:     General: No focal deficit present.     Mental Status: She is alert and oriented to person, place, and time. Mental status is at baseline.  Psychiatric:        Mood and Affect: Mood normal.        Behavior: Behavior normal.        Thought Content: Thought content normal.        Judgment: Judgment normal.      Lab Results:  CBC    Component Value Date/Time   WBC 9.4 11/14/2018 0920   RBC 3.96 11/14/2018 0920   HGB 11.2 (L) 11/14/2018 0920   HGB 11.3 12/30/2017 1405   HCT 37.0 11/14/2018 0920   HCT 34.1 12/30/2017 1405   PLT 426 (H) 11/14/2018 0920   PLT 421 12/30/2017 1405   MCV 93.4 11/14/2018 0920   MCV 87 12/30/2017 1405   MCH 28.3 11/14/2018 0920   MCHC 30.3 11/14/2018 0920   RDW 14.0 11/14/2018 0920   RDW 13.2 12/30/2017 1405   LYMPHSABS 3.0 06/22/2016 1339   MONOABS 0.8 06/22/2016 1339   EOSABS 0.2 06/22/2016 1339   BASOSABS 0.1 06/22/2016 1339    BMET     Component Value Date/Time   NA 138 11/14/2018 0920   NA 142 12/30/2017 1405   K 4.3 11/14/2018 0920   CL 103 11/14/2018 0920   CO2 25 11/14/2018 0920   GLUCOSE 185 (H) 11/14/2018 0920   BUN 21 11/14/2018 0920   BUN 25 12/30/2017 1405   CREATININE 1.38 (H) 11/14/2018 0920   CREATININE 1.32 (H) 11/06/2015 1006   CALCIUM 8.7 (L) 11/14/2018 0920   CALCIUM 9.9 06/04/2008 2240   GFRNONAA 37 (L) 11/14/2018 0920   GFRAA 43 (L) 11/14/2018 0920    BNP No results found for: BNP  ProBNP    Component Value Date/Time   PROBNP 110.0 (H) 11/12/2008 1126    Imaging: No results found.   Assessment & Plan:   COPD - Intolerant of Trelegy - Resume Symbicort 160 twice daily and Incruse once daily  Leg swelling -Taking 80mg  lasix per PCP - Checking BNP   Urinary frequency - Likely from diuretic but she has associated back pain - Check UAC&S     Martyn Ehrich, NP 12/09/2018

## 2018-12-09 NOTE — Assessment & Plan Note (Addendum)
-   Intolerant of Trelegy - Resume Symbicort 160 twice daily and Incruse once daily

## 2018-12-09 NOTE — Patient Instructions (Signed)
Stop Trelegy  Resume Symbicort twice daily Resume Incruse once daily  If still having some wheezing call Monday and will send in small prednisone taper  Let me know how you are doing with the new inhalers next week   Labs and Urine today

## 2018-12-09 NOTE — Assessment & Plan Note (Signed)
-  Taking 80mg  lasix per PCP - Checking BNP

## 2018-12-10 LAB — URINE CULTURE
MICRO NUMBER:: 748477
SPECIMEN QUALITY:: ADEQUATE

## 2018-12-12 NOTE — Progress Notes (Signed)
Urine culture was not adequate, may have been contaminated. If still having urinary symptoms have repeat culture

## 2018-12-13 ENCOUNTER — Encounter: Payer: Medicare HMO | Admitting: *Deleted

## 2018-12-15 ENCOUNTER — Telehealth: Payer: Self-pay | Admitting: Pulmonary Disease

## 2018-12-15 ENCOUNTER — Telehealth: Payer: Self-pay | Admitting: Internal Medicine

## 2018-12-15 MED ORDER — BUDESONIDE-FORMOTEROL FUMARATE 160-4.5 MCG/ACT IN AERO: 2.0000 | INHALATION_SPRAY | Freq: Two times a day (BID) | RESPIRATORY_TRACT | 3 refills | Status: DC

## 2018-12-15 MED ORDER — INCRUSE ELLIPTA 62.5 MCG/INH IN AEPB: 1.0000 | INHALATION_SPRAY | Freq: Every day | RESPIRATORY_TRACT | 3 refills | Status: DC

## 2018-12-15 NOTE — Telephone Encounter (Signed)
Returned call to pt. Advised of invalid urine sample. Pt is not having any sx at this time. Aware would need to repeat cx if she was having any sx. Refills on both Symbicort and Incruse sent to [facility] per patient request for 90 day supply. Nothing further needed.

## 2018-12-15 NOTE — Telephone Encounter (Signed)
I spoke with the pt and gave her the number to [employer] support to get additional help with her monitor. I told the pt to call first thing in the morning around 7 am. The pt verbalized understanding.

## 2018-12-15 NOTE — Telephone Encounter (Signed)
New message:     Patient calling stating that she is having trouble with her machine she is receiving a code 3032. Also patient states some one called her about appt. Please call patient back.

## 2018-12-16 NOTE — Telephone Encounter (Signed)
Monitor ordered 8/14

## 2018-12-20 ENCOUNTER — Other Ambulatory Visit: Payer: Self-pay

## 2018-12-20 ENCOUNTER — Ambulatory Visit (INDEPENDENT_AMBULATORY_CARE_PROVIDER_SITE_OTHER): Payer: Medicare HMO | Admitting: Student

## 2018-12-20 VITALS — BP 144/70 | HR 69 | Ht 65.0 in | Wt 238.0 lb

## 2018-12-20 DIAGNOSIS — I480 Paroxysmal atrial fibrillation: Secondary | ICD-10-CM | POA: Diagnosis not present

## 2018-12-20 LAB — CUP PACEART INCLINIC DEVICE CHECK
Battery Remaining Longevity: 86 mo
Battery Voltage: 3.01 V
Brady Statistic AP VP Percent: 0.2 %
Brady Statistic AP VS Percent: 48.77 %
Brady Statistic AS VP Percent: 1.79 %
Brady Statistic AS VS Percent: 49.23 %
Brady Statistic RA Percent Paced: 47.45 %
Brady Statistic RV Percent Paced: 2.1 %
Date Time Interrogation Session: 20200818093046
Implantable Lead Implant Date: 20170707
Implantable Lead Implant Date: 20170707
Implantable Lead Location: 753859
Implantable Lead Location: 753860
Implantable Lead Model: 3830
Implantable Lead Model: 5076
Implantable Pulse Generator Implant Date: 20170707
Lead Channel Impedance Value: 285 Ohm
Lead Channel Impedance Value: 380 Ohm
Lead Channel Impedance Value: 399 Ohm
Lead Channel Impedance Value: 437 Ohm
Lead Channel Pacing Threshold Amplitude: 0.875 V
Lead Channel Pacing Threshold Amplitude: 1.875 V
Lead Channel Pacing Threshold Pulse Width: 0.4 ms
Lead Channel Pacing Threshold Pulse Width: 0.4 ms
Lead Channel Sensing Intrinsic Amplitude: 4.375 mV
Lead Channel Sensing Intrinsic Amplitude: 4.375 mV
Lead Channel Sensing Intrinsic Amplitude: 6.375 mV
Lead Channel Sensing Intrinsic Amplitude: 6.5 mV
Lead Channel Setting Pacing Amplitude: 1.75 V
Lead Channel Setting Pacing Amplitude: 2.5 V
Lead Channel Setting Pacing Pulse Width: 1 ms
Lead Channel Setting Sensing Sensitivity: 2.8 mV

## 2018-12-20 LAB — BASIC METABOLIC PANEL
BUN/Creatinine Ratio: 21 (ref 12–28)
BUN: 25 mg/dL (ref 8–27)
CO2: 24 mmol/L (ref 20–29)
Calcium: 9.7 mg/dL (ref 8.7–10.3)
Chloride: 100 mmol/L (ref 96–106)
Creatinine, Ser: 1.18 mg/dL — ABNORMAL HIGH (ref 0.57–1.00)
GFR calc Af Amer: 51 mL/min/{1.73_m2} — ABNORMAL LOW (ref >59)
GFR calc non Af Amer: 45 mL/min/{1.73_m2} — ABNORMAL LOW (ref >59)
Glucose: 126 mg/dL — ABNORMAL HIGH (ref 65–99)
Potassium: 4.8 mmol/L (ref 3.5–5.2)
Sodium: 142 mmol/L (ref 134–144)

## 2018-12-20 LAB — MAGNESIUM: Magnesium: 1.7 mg/dL (ref 1.6–2.3)

## 2018-12-20 MED ORDER — ATORVASTATIN CALCIUM 40 MG PO TABS: 40.0000 mg | ORAL_TABLET | Freq: Every day | ORAL | 3 refills | Status: DC

## 2018-12-20 NOTE — Addendum Note (Signed)
Addended by: Claude Manges on: 12/20/2018 09:44 AM   Modules accepted: Orders

## 2018-12-20 NOTE — Progress Notes (Signed)
Electrophysiology Office Note Date: 12/20/2018  ID:  Kayla [name], Kayla [name] [birth year], MRN [id number]  PCP: [name], MD Primary Cardiologist: No primary care provider on file. Electrophysiologist: None  CC: Pacemaker follow-up  Kayla [name] is a 78 y.o. female seen today for Dr. Caryl Comes. She presents today for routine electrophysiology followup.  Since last being seen in our clinic, the patient reports doing well overall.  She had trouble with holding on to extra fluid and her lasix was increased several weeks ago. She feels better on this dose with her edema much improved. Last night she had an episode of Afib with tachypalpitations and chest pressure. She took two NTG and it went away after ~ 40 minutes (length of Afib episode confirmed on device) She denies dyspnea, PND, orthopnea, nausea, vomiting, dizziness, syncope, weight gain, or early satiety.  Device History: Medtronic Dual Chamber PPM implanted 11/2015 for symptomatic bradycardia  Past Medical History:  Diagnosis Date  . Allergy   . Anxiety   . Arthritis   . CAD (coronary artery disease)    Mild per remote cath in 2006, /    Nuclear, January, 2013, no significant abnormality,  . Carotid artery disease ([place])    Doppler, January, 2013, 0 61% RIC A., 44-31% LICA, stable  . Cataract   . Complication of anesthesia    wakes up "mean"  . COPD (chronic obstructive pulmonary disease) ([place])   . Diabetes mellitus, type 2 ([place])   . Diverticula of colon   . Dizziness    occasional  . Ejection fraction   . GERD (gastroesophageal reflux disease)   . Goiter    hx of  . Headache(784.0)    migraine  . Hyperlipidemia   . Hypertension   . Hypothyroidism   . Left ventricular hypertrophy   . Leg edema    occasional swelling  . Obesity   . OSA (obstructive sleep apnea)    cpap setting of 12  . PONV (postoperative nausea and vomiting)    severe after every surgery  . Pulmonary hypertension, secondary   . Sleep  apnea    Past Surgical History:  Procedure Laterality Date  . ABDOMINAL HYSTERECTOMY     with appendectomy and colon polypectomy  . ANTERIOR CERVICAL DECOMP/DISCECTOMY FUSION N/A 11/07/2014   Procedure: Anterior Cervical diskectomy and fusion Cervical four-five, Cervical five-six, Cervical six-seven;  Surgeon: Kary Kos, MD;  Location: [facility];  Service: Neurosurgery;  Laterality: N/A;  . APPENDECTOMY    . ARTERY BIOPSY Right 02/16/2017   Procedure: BIOPSY TEMPORAL ARTERY;  Surgeon: Katha Cabal, MD;  Location: [facility];  Service: Vascular;  Laterality: Right;  . BACK SURGERY    . CARDIAC CATHETERIZATION    . CATARACT EXTRACTION W/ INTRAOCULAR LENS  IMPLANT, BILATERAL Bilateral   . CHOLECYSTECTOMY N/A 04/29/2016   Procedure: LAPAROSCOPIC CHOLECYSTECTOMY WITH INTRAOPERATIVE CHOLANGIOGRAM;  Surgeon: Alphonsa Overall, MD;  Location: [facility];  Service: General;  Laterality: N/A;  . EP IMPLANTABLE DEVICE N/A 11/08/2015   Procedure: Pacemaker Implant;  Surgeon: Deboraha Sprang, MD;  Location: [facility];  Service: Cardiovascular;  Laterality: N/A;  . INSERT / REPLACE / REMOVE PACEMAKER  2017   Dr. Jens Som [facility])  . JOINT REPLACEMENT    . LUMBAR DISC SURGERY  1990's   x 2  . THYROIDECTOMY  2011  . TOTAL KNEE ARTHROPLASTY  07/27/2011   Procedure: TOTAL KNEE ARTHROPLASTY;  Surgeon: Mauri Pole, MD;  Location: [facility];  Service: Orthopedics;  Laterality: Left;  . UPPER ESOPHAGEAL ENDOSCOPIC ULTRASOUND (EUS) N/A 05/14/2016   Procedure: UPPER ESOPHAGEAL ENDOSCOPIC ULTRASOUND (EUS);  Surgeon: Milus Banister, MD;  Location: [facility];  Service: Endoscopy;  Laterality: N/A;  radial only-will be done mod if needed    Current Outpatient Medications  Medication Sig Dispense Refill  . acetaminophen (TYLENOL) 500 MG tablet Take 1,000 mg by mouth every 4 (four) hours as needed for mild pain or moderate pain.     Marland Kitchen albuterol (PROVENTIL) (2.5 MG/3ML) 0.083% nebulizer solution Take 2.5 mg  by nebulization every 6 (six) hours as needed for wheezing or shortness of breath.    Marland Kitchen albuterol (VENTOLIN HFA) 108 (90 Base) MCG/ACT inhaler Inhale 2 puffs into the lungs every 6 (six) hours as needed for wheezing or shortness of breath. 3 Inhaler 3  . amLODipine (NORVASC) 10 MG tablet Take 10 mg by mouth daily.    Marland Kitchen apixaban (ELIQUIS) 5 MG TABS tablet Take 1 tablet (5 mg total) by mouth 2 (two) times daily. 180 tablet 2  . atorvastatin (LIPITOR) 40 MG tablet Take 1 tablet (40 mg total) by mouth daily. 90 tablet 3  . budesonide-formoterol (SYMBICORT) 160-4.5 MCG/ACT inhaler Inhale 2 puffs into the lungs 2 (two) times daily for 1 day. 3 Inhaler 3  . cholecalciferol (VITAMIN D) 1000 units tablet Take 1,000 Units by mouth daily.    . fluticasone (FLONASE) 50 MCG/ACT nasal spray USE 2 SPRAYS NASALLY DAILY 48 g 1  . furosemide (LASIX) 40 MG tablet TAKE 1 TABLET (40 MG TOTAL) BY MOUTH DAILY. (Patient taking differently: Take 40 mg by mouth daily. Taking two tabs now due to feet swelling) 90 tablet 0  . gabapentin (NEURONTIN) 800 MG tablet Take 800 mg by mouth 3 (three) times daily.     Marland Kitchen HYDROcodone-acetaminophen (NORCO/VICODIN) 5-325 MG tablet Take 1-2 tablets by mouth every 6 (six) hours as needed for moderate pain. 30 tablet 0  . insulin aspart (NOVOLOG FLEXPEN) 100 UNIT/ML FlexPen Inject 6-10 Units into the skin 3 (three) times daily as needed for high blood sugar (CBG >130).    . insulin glargine (LANTUS) 100 UNIT/ML injection Inject 100 Units into the skin 2 (two) times daily.     Marland Kitchen levothyroxine (SYNTHROID) 125 MCG tablet Take 125 mcg by mouth daily before breakfast.    . LORazepam (ATIVAN) 2 MG tablet Take 2 mg by mouth at bedtime as needed for anxiety (depression).     Marland Kitchen losartan (COZAAR) 100 MG tablet Take 100 mg by mouth daily.     . metoprolol tartrate (LOPRESSOR) 25 MG tablet Take 50 mg in the morning and 25 mg at night    . Phenylephrine-Chlorphen-DM (TUSS-DM PO) Take by mouth.    Marland Kitchen  PRESCRIPTION MEDICATION Inhale into the lungs at bedtime. CPAP    . promethazine (PHENADOZ) 25 MG suppository Place 25 mg rectally daily as needed (headache).    . SUMAtriptan (IMITREX) 25 MG tablet Take 25 mg by mouth See admin instructions. Take one tablet (25 mg) by mouth at onset of migraine headache. May repeat in 2 hours if needed.    . TRUE METRIX BLOOD GLUCOSE TEST test strip     . umeclidinium bromide (INCRUSE ELLIPTA) 62.5 MCG/INH AEPB Inhale 1 puff into the lungs daily. 90 each 3  . vitamin B-12 (CYANOCOBALAMIN) 1000 MCG tablet Take 1,000 mcg by mouth daily.    . nitroGLYCERIN (NITROSTAT) 0.4 MG SL tablet Place 1 tablet (0.4 mg total) under  the tongue every 5 (five) minutes as needed for chest pain. (Patient not taking: Reported on 10/07/2018) 25 tablet 3   Current Facility-Administered Medications  Medication Dose Route Frequency Provider Last Rate Last Dose  . 0.9 %  sodium chloride infusion  500 mL Intravenous Continuous Milus Banister, MD        Allergies:   Patient has no known allergies.   Social History: Social History   Socioeconomic History  . Marital status: Married    Spouse name: Jeneen Rinks  . Number of children: 3  . Years of education: 38  . Highest education level: Not on file  Occupational History  . Occupation: retired  Scientific laboratory technician  . Financial resource strain: Not on file  . Food insecurity    Worry: Not on file    Inability: Not on file  . Transportation needs    Medical: Not on file    Non-medical: Not on file  Tobacco Use  . Smoking status: Former Smoker    Packs/day: 2.00    Years: 33.00    Pack years: 66.00    Types: Cigarettes    Start date: 1966    Quit date: 05/04/1997    Years since quitting: 21.6  . Smokeless tobacco: Never Used  Substance and Sexual Activity  . Alcohol use: No  . Drug use: No  . Sexual activity: Not on file  Lifestyle  . Physical activity    Days per week: Not on file    Minutes per session: Not on file  . Stress:  Not on file  Relationships  . Social Herbalist on phone: Not on file    Gets together: Not on file    Attends religious service: Not on file    Active member of club or organization: Not on file    Attends meetings of clubs or organizations: Not on file    Relationship status: Not on file  . Intimate partner violence    Fear of current or ex partner: Not on file    Emotionally abused: Not on file    Physically abused: Not on file    Forced sexual activity: Not on file  Other Topics Concern  . Not on file  Social History Narrative   Patient lives at home with her husband Jeneen Rinks .   Retired.   Education 12 th grade.   Right handed   Caffeine two cups of coffee.    Family History: Family History  Problem Relation Age of Onset  . Heart Problems Mother   . Heart defect Mother        valve problems  . Heart Problems Father   . Heart disease Brother   . Lung cancer Daughter        non small cell   . Diabetes Maternal Aunt   . Diabetes Maternal Uncle   . Throat cancer Brother   . Colon cancer Neg Hx   . Colon polyps Neg Hx   . Esophageal cancer Neg Hx   . Rectal cancer Neg Hx   . Stomach cancer Neg Hx      Review of Systems: All other systems reviewed and are otherwise negative except as noted above.  Physical Exam: Vitals:   12/20/18 0901  BP: (!) 144/70  Pulse: 69  Weight: 238 lb (108 kg)  Height: 5\' 5"  (1.651 m)     GEN- The patient is well appearing, alert and oriented x 3 today.   HEENT: normocephalic,  atraumatic; sclera clear, conjunctiva pink; hearing intact; oropharynx clear; neck supple  Lungs- Clear to ausculation bilaterally, normal work of breathing.  No wheezes, rales, rhonchi Heart- Regular rate and rhythm, no murmurs, rubs or gallops  GI- soft, non-tender, non-distended, bowel sounds present  Extremities- no clubbing, cyanosis, or edema  MS- no significant deformity or atrophy Skin- warm and dry, no rash or lesion; PPM pocket well  healed Psych- euthymic mood, full affect Neuro- strength and sensation are intact  PPM Interrogation- reviewed in detail today,  See PACEART report  EKG:  EKG is not ordered today. Personal review of EKG from 11/14/2018 shows A paced rhythm at 71 bpm  Recent Labs: 11/14/2018: BUN 21; Creatinine, Ser 1.38; Hemoglobin 11.2; Platelets 426; Potassium 4.3; Sodium 138 12/09/2018: Pro B Natriuretic peptide (BNP) 121.0   Wt Readings from Last 3 Encounters:  12/20/18 238 lb (108 kg)  12/09/18 239 lb 6.4 oz (108.6 kg)  11/17/18 234 lb (106.1 kg)     Other studies Reviewed: Additional studies/ records that were reviewed today include: Previous office notes, previous test results, recent labs.   Assessment and Plan:  1.  Symptomatic bradycardia s/p Medtronic PPM  Normal PPM function See Pace Art report No changes today  2. Paroxysmal Afib She is having some symptomatic breakthrough. By her device, it seems to correlate with the increase in her diuretic. Check BMET and Mg today.  She has not missed any dose of Eliquis.  This patients CHA2DS2-VASc Score and unadjusted Ischemic Stroke Rate (% per year) is equal to 9.7 % stroke rate/year from a score of 6  Above score calculated as 1 point each if present [CHF, HTN, DM, Vascular=MI/PAD/Aortic Plaque, Age if 65-74, or Female] Above score calculated as 2 points each if present [Age > 75, or Stroke/TIA/TE]  3. OSA Encouraged nightly CPAP  4. CAD Cardiac CTA 12/31/2017 with diffuse plaque with moderate disease in RCA and LAD. FFR showed stenosis in distal OM2. Suspect anginal symptoms last night related to Afib with RVR. Pt knows to call if this recurs or worsens. She refused any further work up at this point today.   5. Chronic diastolic CHF Euvolemic on exam today with recent increase in diuretics. BMET/Mg today  5. HTN Borderline today. Plans to continue to follow at home.   Current medicines are reviewed at length with the patient today.    The patient does not have concerns regarding her medicines.  The following changes were made today:  none  Labs/ tests ordered today include:  No orders of the defined types were placed in this encounter.   Disposition:   Follow up with Dr. Caryl Comes annually. Continue q 3 month remotes. Continue Afib clinic f/u as needed.   Jacalyn Lefevre, PA-C  12/20/2018 9:08 AM  [facility] [address] [place] [address] [phone] (office) [phone] (fax)

## 2018-12-20 NOTE — Patient Instructions (Signed)
Medication Instructions:  Your physician recommends that you continue on your current medications as directed. Please refer to the Current Medication list given to you today.  If you need a refill on your cardiac medications before your next appointment, please call your pharmacy.   Lab work: BMET AND MAG   If you have labs (blood work) drawn today and your tests are completely normal, you will receive your results only by: Marland Kitchen MyChart Message (if you have MyChart) OR . A paper copy in the mail If you have any lab test that is abnormal or we need to change your treatment, we will call you to review the results.  Testing/Procedures: NONE ORDERED  TODAY  Follow-Up: At [facility], you and your health needs are our priority.  As part of our continuing mission to provide you with exceptional heart care, we have created designated Provider Care Teams.  These Care Teams include your primary Cardiologist (physician) and Advanced Practice Providers (APPs -  Physician Assistants and Nurse Practitioners) who all work together to provide you with the care you need, when you need it. You will need a follow up appointment in 1 years.  Please call our office 2 months in advance to schedule this appointment.  You may see  Dr Caryl Comes  or one of the following Advanced Practice Providers on your designated Care Team:   Chanetta Marshall, NP . Tommye Standard, PA-C . Joesph July PA-C   Any Other Special Instructions Will Be Listed Below (If Applicable).

## 2018-12-21 ENCOUNTER — Other Ambulatory Visit: Payer: Self-pay | Admitting: *Deleted

## 2018-12-21 ENCOUNTER — Telehealth: Payer: Self-pay | Admitting: Student

## 2018-12-21 ENCOUNTER — Encounter: Payer: Self-pay | Admitting: Cardiology

## 2018-12-21 MED ORDER — MAGNESIUM OXIDE 400 MG PO CAPS: 400.0000 mg | ORAL_CAPSULE | Freq: Every day | ORAL | 0 refills | Status: DC

## 2018-12-21 MED ORDER — MAGNESIUM OXIDE 400 MG PO CAPS: 400.0000 mg | ORAL_CAPSULE | Freq: Every day | ORAL | 1 refills | Status: AC

## 2018-12-21 NOTE — Telephone Encounter (Signed)
New message ° ° °Patient is returning call for lab results. Please call. °

## 2018-12-22 ENCOUNTER — Ambulatory Visit (INDEPENDENT_AMBULATORY_CARE_PROVIDER_SITE_OTHER): Payer: Medicare HMO | Admitting: *Deleted

## 2018-12-22 DIAGNOSIS — I495 Sick sinus syndrome: Secondary | ICD-10-CM | POA: Diagnosis not present

## 2018-12-22 LAB — CUP PACEART REMOTE DEVICE CHECK
Battery Remaining Longevity: 86 mo
Battery Voltage: 3.01 V
Brady Statistic AP VP Percent: 0.04 %
Brady Statistic AP VS Percent: 84.56 %
Brady Statistic AS VP Percent: 0.68 %
Brady Statistic AS VS Percent: 14.72 %
Brady Statistic RA Percent Paced: 83.82 %
Brady Statistic RV Percent Paced: 0.74 %
Date Time Interrogation Session: 20200820150226
Implantable Lead Implant Date: 20170707
Implantable Lead Implant Date: 20170707
Implantable Lead Location: 753859
Implantable Lead Location: 753860
Implantable Lead Model: 3830
Implantable Lead Model: 5076
Implantable Pulse Generator Implant Date: 20170707
Lead Channel Impedance Value: 285 Ohm
Lead Channel Impedance Value: 361 Ohm
Lead Channel Impedance Value: 399 Ohm
Lead Channel Impedance Value: 399 Ohm
Lead Channel Pacing Threshold Amplitude: 0.875 V
Lead Channel Pacing Threshold Pulse Width: 0.4 ms
Lead Channel Sensing Intrinsic Amplitude: 5.375 mV
Lead Channel Sensing Intrinsic Amplitude: 7.125 mV
Lead Channel Setting Pacing Amplitude: 1.75 V
Lead Channel Setting Pacing Amplitude: 2.5 V
Lead Channel Setting Pacing Pulse Width: 1 ms
Lead Channel Setting Sensing Sensitivity: 2.8 mV

## 2018-12-22 NOTE — Telephone Encounter (Signed)
Transmission received.

## 2018-12-22 NOTE — Telephone Encounter (Signed)
Follow Up:     Pt is returning call from [facility] on  yesterday, concerning her lab results.

## 2018-12-22 NOTE — Telephone Encounter (Signed)
  Patient is returning call, please call mobile number listed

## 2018-12-22 NOTE — Telephone Encounter (Signed)
Left the pt a message to call the office back and request to speak with any triage nurse, for further assistance with getting her lab results.

## 2018-12-22 NOTE — Telephone Encounter (Signed)
Result Notes for Basic metabolic panel  Notes recorded by Claude Manges, CMA on 12/21/2018 at 4:11 PM EDT  Lvm to call back about results and new magnesium 400 mg once a day prescription to start 30 day supply to sent to local 90 day sent mail order  ------   Notes recorded by Shirley Friar, PA-C on 12/21/2018 at 8:13 AM EDT  Please add magnesium oxide 400 mg daily. Thank you!    Legrand Como [address]" [place], PA-C  12/21/2018 8:13 AM       Spoke to the pt and informed her of her lab results and recommendation per [name] PA-C, for her to start taking Magnesium 400 mg po daily.  Informed the pt that this med was already sent in to [facility], by Andy's CMA.  Pt verbalized understanding and agrees with this plan.   While the pt was on the phone, she inquired that she needs our device team to call her today, as they instructed her to, to set up her new pacemaker transmission machine. Pt states she just got this in the mail today.  Spoke with Pamala Hurry in device, and will send her a message on this pt, for her to call and set up her machine.  Pt thanked me for all the assistance.

## 2018-12-22 NOTE — Telephone Encounter (Signed)
Spoke w/ pt and informed her to go ahead and send a remote transmission today for her missed transmission. Transmission received.

## 2018-12-23 NOTE — Telephone Encounter (Signed)
Spoke with patient and she got messaged she's aware of results with medication recommendations and verbalized understanding.

## 2018-12-25 NOTE — Progress Notes (Signed)
Reviewed and agree with assessment/plan.   Saumya Hukill, MD [facility] 04/29/2016, 12:24 PM Pager:  [phone]  

## 2018-12-28 DIAGNOSIS — G4733 Obstructive sleep apnea (adult) (pediatric): Secondary | ICD-10-CM | POA: Diagnosis not present

## 2018-12-30 NOTE — Progress Notes (Signed)
Remote pacemaker transmission.   

## 2019-01-03 DIAGNOSIS — G8929 Other chronic pain: Secondary | ICD-10-CM | POA: Diagnosis not present

## 2019-01-03 DIAGNOSIS — N183 Chronic kidney disease, stage 3 (moderate): Secondary | ICD-10-CM | POA: Diagnosis not present

## 2019-01-03 DIAGNOSIS — E782 Mixed hyperlipidemia: Secondary | ICD-10-CM | POA: Diagnosis not present

## 2019-01-03 DIAGNOSIS — E1149 Type 2 diabetes mellitus with other diabetic neurological complication: Secondary | ICD-10-CM | POA: Diagnosis not present

## 2019-01-03 DIAGNOSIS — E890 Postprocedural hypothyroidism: Secondary | ICD-10-CM | POA: Diagnosis not present

## 2019-01-03 DIAGNOSIS — Z79899 Other long term (current) drug therapy: Secondary | ICD-10-CM | POA: Diagnosis not present

## 2019-01-03 DIAGNOSIS — I10 Essential (primary) hypertension: Secondary | ICD-10-CM | POA: Diagnosis not present

## 2019-01-03 DIAGNOSIS — M545 Low back pain: Secondary | ICD-10-CM | POA: Diagnosis not present

## 2019-01-03 DIAGNOSIS — E1165 Type 2 diabetes mellitus with hyperglycemia: Secondary | ICD-10-CM | POA: Diagnosis not present

## 2019-01-12 ENCOUNTER — Telehealth: Payer: Self-pay | Admitting: Pulmonary Disease

## 2019-01-12 NOTE — Telephone Encounter (Signed)
ATC pt, no answer. Left message for pt to call back.  It looks like we sent in a 90 day supply with refills to [facility] on 12/15/2018  umeclidinium bromide (INCRUSE ELLIPTA) 62.5 MCG/INH AEPB [[id number]]   Order Details Dose: 1 puff Route: Inhalation Frequency: Daily  Dispense Quantity: 90 each Refills: 3 Fills remaining: --        Sig: Inhale 1 puff into the lungs daily.       Written Date: 12/15/18 Expiration Date: 12/15/19    Start Date: 12/15/18 End Date: --         Ordering Provider: Martyn Ehrich, NP DEA #:  [id number] NPI:  [id number]   Authorizing Provider: Martyn Ehrich, NP DEA #:  [id number] NPI:  [id number]   Ordering User:  Stephanie Coup, CMA            Original Order:  umeclidinium bromide (INCRUSE ELLIPTA) 62.5 MCG/INH AEPB [[id number]]    Pharmacy:  [facility], [place]  DEA #:  --  Pharmacy Comments: --       Fill quantity remaining: -- Fill quantity used: -- Next fill due: --        Order Class  Normal  All Administrations of umeclidinium bromide (INCRUSE ELLIPTA) 62.5 MCG/INH AEPB  (suggestion)  The administrations shown are only for this specific order and not for other orders for the same medication that may be in this encounter.  No Administrations Recorded         Med Administrations and Associated Flowsheet Values (last 96 hours)  None  Warnings History  No Interaction Warnings

## 2019-01-13 NOTE — Telephone Encounter (Signed)
ATC, line went to voicemail. LMTCB x1.

## 2019-01-13 NOTE — Telephone Encounter (Signed)
Patient is returning phone call.  Patient phone number is [phone].

## 2019-01-13 NOTE — Telephone Encounter (Signed)
LMTCB x2 for pt 

## 2019-01-16 NOTE — Telephone Encounter (Signed)
LMTCB x2 for pt 

## 2019-01-17 MED ORDER — INCRUSE ELLIPTA 62.5 MCG/INH IN AEPB: 1.0000 | INHALATION_SPRAY | Freq: Every day | RESPIRATORY_TRACT | 3 refills | Status: DC

## 2019-01-17 NOTE — Telephone Encounter (Signed)
Spoke with pt. She states that she needs a refill on Incruse sent to [facility]. Advised her that we sent in the prescription last month. Pt states that [employer] doesn't have it on file. Rx has been sent in. Nothing further was needed at this time.

## 2019-01-25 ENCOUNTER — Telehealth: Payer: Self-pay | Admitting: Pulmonary Disease

## 2019-01-25 MED ORDER — INCRUSE ELLIPTA 62.5 MCG/INH IN AEPB: 1.0000 | INHALATION_SPRAY | Freq: Every day | RESPIRATORY_TRACT | 0 refills | Status: DC

## 2019-01-25 NOTE — Telephone Encounter (Signed)
Spoke with pt. She needs a 30 day supply of Incruse sent to [facility] in [place]. Rx has been sent in. Nothing further was needed.

## 2019-01-28 DIAGNOSIS — G4733 Obstructive sleep apnea (adult) (pediatric): Secondary | ICD-10-CM | POA: Diagnosis not present

## 2019-01-31 DIAGNOSIS — G4733 Obstructive sleep apnea (adult) (pediatric): Secondary | ICD-10-CM | POA: Diagnosis not present

## 2019-02-22 DIAGNOSIS — E890 Postprocedural hypothyroidism: Secondary | ICD-10-CM | POA: Diagnosis not present

## 2019-02-27 DIAGNOSIS — G4733 Obstructive sleep apnea (adult) (pediatric): Secondary | ICD-10-CM | POA: Diagnosis not present

## 2019-03-06 DIAGNOSIS — Z6838 Body mass index (BMI) 38.0-38.9, adult: Secondary | ICD-10-CM | POA: Diagnosis not present

## 2019-03-06 DIAGNOSIS — L84 Corns and callosities: Secondary | ICD-10-CM | POA: Diagnosis not present

## 2019-03-06 DIAGNOSIS — E1140 Type 2 diabetes mellitus with diabetic neuropathy, unspecified: Secondary | ICD-10-CM | POA: Diagnosis not present

## 2019-03-06 DIAGNOSIS — R600 Localized edema: Secondary | ICD-10-CM | POA: Diagnosis not present

## 2019-03-16 ENCOUNTER — Encounter: Payer: Self-pay | Admitting: Podiatry

## 2019-03-16 ENCOUNTER — Ambulatory Visit: Payer: Medicare HMO

## 2019-03-16 ENCOUNTER — Other Ambulatory Visit: Payer: Self-pay

## 2019-03-16 ENCOUNTER — Ambulatory Visit: Payer: Medicare HMO | Admitting: Podiatry

## 2019-03-16 DIAGNOSIS — B351 Tinea unguium: Secondary | ICD-10-CM

## 2019-03-16 DIAGNOSIS — M76822 Posterior tibial tendinitis, left leg: Secondary | ICD-10-CM | POA: Diagnosis not present

## 2019-03-16 DIAGNOSIS — M79674 Pain in right toe(s): Secondary | ICD-10-CM | POA: Diagnosis not present

## 2019-03-16 DIAGNOSIS — L84 Corns and callosities: Secondary | ICD-10-CM | POA: Diagnosis not present

## 2019-03-16 DIAGNOSIS — E1149 Type 2 diabetes mellitus with other diabetic neurological complication: Secondary | ICD-10-CM | POA: Diagnosis not present

## 2019-03-16 DIAGNOSIS — M778 Other enthesopathies, not elsewhere classified: Secondary | ICD-10-CM

## 2019-03-16 DIAGNOSIS — M79675 Pain in left toe(s): Secondary | ICD-10-CM

## 2019-03-16 NOTE — Progress Notes (Signed)
Subjective:   Patient ID: Kayla [name], female   DOB: 78 y.o.   MRN: [id number]   HPI Patient states has developed a lot of pain on the inside of the left ankle with inflammation and hard to walk and also has nails that she cannot cut they get thick and need to be taken care of and lesion on the left hallux is been very thickened painful and she cannot take care of.  Patient states her last A1c was 8 point   ROS      Objective:  Physical Exam  Neurovascular status is intact with patient noted severe keratotic lesion left hallux that is thickened and is noted to have inflammation pain posterior tibial tendon as it comes under the medial malleolus left with moderate depression of the arch noted.  Patient has nail disease 1-5 both feet that are thickened yellow brittle and she cannot cut     Assessment:  Posterior tibial tendinitis left with inflammation along with mycotic nail infection bilateral and keratotic lesion left hallux that is painful when palpated     Plan:  H&P reviewed all conditions and today I did do a careful injection of the sheath of the left posterior tib 3 mg Dexasone Kenalog 5 mg Xylocaine and applied fascial brace to lift up the arch.  I then went ahead debrided nailbeds 1-5 both feet and lesion left with some bleeding of the left hallux due to the fact that the skin is very thin in this area.  I applied sterile dressing with Neosporin and instructed her to leave this on 24 hours and then begin soaks and let us know if any issues were to occur

## 2019-03-16 NOTE — Patient Instructions (Signed)
Diabetes Mellitus and Foot Care Foot care is an important part of your health, especially when you have diabetes. Diabetes may cause you to have problems because of poor blood flow (circulation) to your feet and legs, which can cause your skin to:  Become thinner and drier.  Break more easily.  Heal more slowly.  Peel and crack. You may also have nerve damage (neuropathy) in your legs and feet, causing decreased feeling in them. This means that you may not notice minor injuries to your feet that could lead to more serious problems. Noticing and addressing any potential problems early is the best way to prevent future foot problems. How to care for your feet Foot hygiene  Wash your feet daily with warm water and mild soap. Do not use hot water. Then, pat your feet and the areas between your toes until they are completely dry. Do not soak your feet as this can dry your skin.  Trim your toenails straight across. Do not dig under them or around the cuticle. File the edges of your nails with an emery board or nail file.  Apply a moisturizing lotion or petroleum jelly to the skin on your feet and to dry, brittle toenails. Use lotion that does not contain alcohol and is unscented. Do not apply lotion between your toes. Shoes and socks  Wear clean socks or stockings every day. Make sure they are not too tight. Do not wear knee-high stockings since they may decrease blood flow to your legs.  Wear shoes that fit properly and have enough cushioning. Always look in your shoes before you put them on to be sure there are no objects inside.  To break in new shoes, wear them for just a few hours a day. This prevents injuries on your feet. Wounds, scrapes, corns, and calluses  Check your feet daily for blisters, cuts, bruises, sores, and redness. If you cannot see the bottom of your feet, use a mirror or ask someone for help.  Do not cut corns or calluses or try to remove them with medicine.  If you  find a minor scrape, cut, or break in the skin on your feet, keep it and the skin around it clean and dry. You may clean these areas with mild soap and water. Do not clean the area with peroxide, alcohol, or iodine.  If you have a wound, scrape, corn, or callus on your foot, look at it several times a day to make sure it is healing and not infected. Check for: ? Redness, swelling, or pain. ? Fluid or blood. ? Warmth. ? Pus or a bad smell. General instructions  Do not cross your legs. This may decrease blood flow to your feet.  Do not use heating pads or hot water bottles on your feet. They may burn your skin. If you have lost feeling in your feet or legs, you may not know this is happening until it is too late.  Protect your feet from hot and cold by wearing shoes, such as at the beach or on hot pavement.  Schedule a complete foot exam at least once a year (annually) or more often if you have foot problems. If you have foot problems, report any cuts, sores, or bruises to your health care provider immediately. Contact a health care provider if:  You have a medical condition that increases your risk of infection and you have any cuts, sores, or bruises on your feet.  You have an injury that is not   healing.  You have redness on your legs or feet.  You feel burning or tingling in your legs or feet.  You have pain or cramps in your legs and feet.  Your legs or feet are numb.  Your feet always feel cold.  You have pain around a toenail. Get help right away if:  You have a wound, scrape, corn, or callus on your foot and: ? You have pain, swelling, or redness that gets worse. ? You have fluid or blood coming from the wound, scrape, corn, or callus. ? Your wound, scrape, corn, or callus feels warm to the touch. ? You have pus or a bad smell coming from the wound, scrape, corn, or callus. ? You have a fever. ? You have a red line going up your leg. Summary  Check your feet every day  for cuts, sores, red spots, swelling, and blisters.  Moisturize feet and legs daily.  Wear shoes that fit properly and have enough cushioning.  If you have foot problems, report any cuts, sores, or bruises to your health care provider immediately.  Schedule a complete foot exam at least once a year (annually) or more often if you have foot problems. This information is not intended to replace advice given to you by your health care provider. Make sure you discuss any questions you have with your health care provider. Document Released: 04/17/2000 Document Revised: 06/02/2017 Document Reviewed: 05/22/2016 Elsevier Patient Education  2020 Elsevier Inc.  

## 2019-03-21 DIAGNOSIS — J441 Chronic obstructive pulmonary disease with (acute) exacerbation: Secondary | ICD-10-CM | POA: Diagnosis not present

## 2019-03-21 DIAGNOSIS — Z6837 Body mass index (BMI) 37.0-37.9, adult: Secondary | ICD-10-CM | POA: Diagnosis not present

## 2019-03-23 ENCOUNTER — Ambulatory Visit (INDEPENDENT_AMBULATORY_CARE_PROVIDER_SITE_OTHER): Payer: Medicare HMO | Admitting: *Deleted

## 2019-03-23 DIAGNOSIS — I495 Sick sinus syndrome: Secondary | ICD-10-CM

## 2019-03-23 DIAGNOSIS — I517 Cardiomegaly: Secondary | ICD-10-CM

## 2019-03-24 LAB — CUP PACEART REMOTE DEVICE CHECK
Battery Remaining Longevity: 78 mo
Battery Voltage: 3.01 V
Brady Statistic AP VP Percent: 0.14 %
Brady Statistic AP VS Percent: 87.18 %
Brady Statistic AS VP Percent: 2.76 %
Brady Statistic AS VS Percent: 9.92 %
Brady Statistic RA Percent Paced: 84.78 %
Brady Statistic RV Percent Paced: 2.98 %
Date Time Interrogation Session: 20201120142913
Implantable Lead Implant Date: 20170707
Implantable Lead Implant Date: 20170707
Implantable Lead Location: 753859
Implantable Lead Location: 753860
Implantable Lead Model: 3830
Implantable Lead Model: 5076
Implantable Pulse Generator Implant Date: 20170707
Lead Channel Impedance Value: 285 Ohm
Lead Channel Impedance Value: 361 Ohm
Lead Channel Impedance Value: 399 Ohm
Lead Channel Impedance Value: 399 Ohm
Lead Channel Pacing Threshold Amplitude: 0.875 V
Lead Channel Pacing Threshold Amplitude: 1.875 V
Lead Channel Pacing Threshold Pulse Width: 0.4 ms
Lead Channel Pacing Threshold Pulse Width: 0.4 ms
Lead Channel Sensing Intrinsic Amplitude: 3.75 mV
Lead Channel Sensing Intrinsic Amplitude: 3.75 mV
Lead Channel Sensing Intrinsic Amplitude: 6.875 mV
Lead Channel Sensing Intrinsic Amplitude: 6.875 mV
Lead Channel Setting Pacing Amplitude: 2 V
Lead Channel Setting Pacing Amplitude: 2.5 V
Lead Channel Setting Pacing Pulse Width: 1 ms
Lead Channel Setting Sensing Sensitivity: 2.8 mV

## 2019-03-30 DIAGNOSIS — G4733 Obstructive sleep apnea (adult) (pediatric): Secondary | ICD-10-CM | POA: Diagnosis not present

## 2019-04-03 DIAGNOSIS — Z1331 Encounter for screening for depression: Secondary | ICD-10-CM | POA: Diagnosis not present

## 2019-04-03 DIAGNOSIS — Z6837 Body mass index (BMI) 37.0-37.9, adult: Secondary | ICD-10-CM | POA: Diagnosis not present

## 2019-04-03 DIAGNOSIS — Z9181 History of falling: Secondary | ICD-10-CM | POA: Diagnosis not present

## 2019-04-03 DIAGNOSIS — Z0000 Encounter for general adult medical examination without abnormal findings: Secondary | ICD-10-CM | POA: Diagnosis not present

## 2019-04-03 DIAGNOSIS — E785 Hyperlipidemia, unspecified: Secondary | ICD-10-CM | POA: Diagnosis not present

## 2019-04-13 ENCOUNTER — Encounter: Payer: Self-pay | Admitting: Cardiology

## 2019-04-13 ENCOUNTER — Other Ambulatory Visit: Payer: Self-pay

## 2019-04-13 ENCOUNTER — Ambulatory Visit: Payer: Medicare HMO | Admitting: Cardiology

## 2019-04-13 VITALS — BP 142/60 | HR 90 | Ht 65.0 in | Wt 240.0 lb

## 2019-04-13 DIAGNOSIS — Z950 Presence of cardiac pacemaker: Secondary | ICD-10-CM | POA: Diagnosis not present

## 2019-04-13 DIAGNOSIS — N183 Chronic kidney disease, stage 3 unspecified: Secondary | ICD-10-CM | POA: Diagnosis not present

## 2019-04-13 DIAGNOSIS — I480 Paroxysmal atrial fibrillation: Secondary | ICD-10-CM | POA: Diagnosis not present

## 2019-04-13 DIAGNOSIS — E1122 Type 2 diabetes mellitus with diabetic chronic kidney disease: Secondary | ICD-10-CM | POA: Diagnosis not present

## 2019-04-13 DIAGNOSIS — J449 Chronic obstructive pulmonary disease, unspecified: Secondary | ICD-10-CM | POA: Diagnosis not present

## 2019-04-13 DIAGNOSIS — I493 Ventricular premature depolarization: Secondary | ICD-10-CM | POA: Diagnosis not present

## 2019-04-13 DIAGNOSIS — E1140 Type 2 diabetes mellitus with diabetic neuropathy, unspecified: Secondary | ICD-10-CM | POA: Diagnosis not present

## 2019-04-13 DIAGNOSIS — R079 Chest pain, unspecified: Secondary | ICD-10-CM

## 2019-04-13 MED ORDER — AMLODIPINE BESYLATE 5 MG PO TABS: 5.0000 mg | ORAL_TABLET | Freq: Every day | ORAL | 3 refills | Status: DC

## 2019-04-13 MED ORDER — NITROGLYCERIN 0.4 MG SL SUBL: 0.4000 mg | SUBLINGUAL_TABLET | SUBLINGUAL | 4 refills | Status: DC | PRN

## 2019-04-13 NOTE — Patient Instructions (Signed)
Medication Instructions:  The current medical regimen is effective;  continue present plan and medications.  *If you need a refill on your cardiac medications before your next appointment, please call your pharmacy*  Follow-Up: At [facility], you and your health needs are our priority.  As part of our continuing mission to provide you with exceptional heart care, we have created designated Provider Care Teams.  These Care Teams include your primary Cardiologist (physician) and Advanced Practice Providers (APPs -  Physician Assistants and Nurse Practitioners) who all work together to provide you with the care you need, when you need it.  Your next appointment:   6 month(s)  The format for your next appointment:   In Person  Provider:   Candee Furbish, MD  Thank you for choosing [facility]!!

## 2019-04-13 NOTE — Progress Notes (Signed)
Cardiology Office Note    Date:  04/13/2019   ID:  Kayla [name], Kayla [name] 10/10/1940, MRN [id number]  PCP:  [name], MD  Cardiologist:   Candee Furbish, MD     History of Present Illness:  Kayla [name] is a 78 y.o. female former patient of Dr. Ron Parker also followed by Dr. Caryl Comes for pacemaker here for follow-up. Had cholecystectomy on 04/29/16 for biliary dyskinesia. Was seen previously on 10/09/14 by Margarite Gouge for preoperative risk assessment for Dr. Saintclair Halsted, surgical cervical fusion.   Overall she has been doing very well.  Her energy level seems to have improved. Her blood pressure has been in the 150 range at home. At one point her metoprolol was stopped because of her bradycardia but now since she has a pacemaker, this has been restarted on 08/24/16.  Low risk Myoview in 2013, catheterization 2006 nonobstructive CAD echocardiogram 2015 normal EF  48 hour Holter monitor 2015 PACs, PVCs, 3 beat PAT.  Event recorder - junctional rhythm high 30's and low 40's at times associated with dizziness. This prompted pacer.  Carotid Dopplers 40-59% left internal carotid, 39% right.  History of COPD, long-standing smoking. Dr. Halford Chessman. Chronic dyspnea on exertion no change.  08/02/17 - BP yesterday 152 SBP. Took all Meds. COPD is bad. No fevers or chills.  It is unusual for her to have lower blood pressure.  She is feeling some slight dizziness.  We have given her water, fluids.  I am instructed her to hold her Lasix 20 mg.  Continue to watch this closely.  Still having shortness of breath at baseline from her COPD.  04/13/19 - congestion, prednisone, amox. Still cough but better. Amolipine down to 5. From 10. Serlling improved.    Past Medical History:  Diagnosis Date  . Allergy   . Anxiety   . Arthritis   . CAD (coronary artery disease)    Mild per remote cath in 2006, /    Nuclear, January, 2013, no significant abnormality,  . Carotid artery disease ([place])    Doppler, January, 2013,  0 99% RIC A., 24-26% LICA, stable  . Cataract   . Complication of anesthesia    wakes up "mean"  . COPD 02/16/2007   Intolerant of Spiriva, tudorza Intolerant of Trelegy   . COPD (chronic obstructive pulmonary disease) ([place])   . Coronary atherosclerosis 11/24/2008   Catheterization 2006, nonobstructive coronary disease   //   nuclear 2013, low risk    . Diabetes mellitus, type 2 ([place])   . Diverticula of colon   . DIVERTICULOSIS OF COLON 03/23/2007   Qualifier: Diagnosis of  By: Halford Chessman MD, Vineet    . Dizziness    occasional  . Dizzy spells 05/04/2011  . DM (diabetes mellitus) ([place]) 03/23/2007   Qualifier: Diagnosis of  By: Halford Chessman MD, Vineet    . Ejection fraction   . Essential hypertension 02/16/2007   Qualifier: Diagnosis of  By: Rosana Hoes CMA, Tammy    . G E R D 03/23/2007   Qualifier: Diagnosis of  By: Halford Chessman MD, Vineet    . Gall bladder disease 04/28/2016  . Gastritis and gastroduodenitis   . GERD (gastroesophageal reflux disease)   . Goiter    hx of  . Headache(784.0)    migraine  . Hyperlipidemia   . Hypertension   . Hypothyroidism   . HYPOTHYROIDISM, POSTSURGICAL 06/04/2008   Qualifier: Diagnosis of  By: Loanne Drilling MD, Jacelyn Pi   . Left ventricular hypertrophy   .  Leg edema    occasional swelling  . Leg swelling 12/09/2018  . Myofascial pain syndrome, cervical 01/06/2018  . Obesity   . OBESITY 11/24/2008   Qualifier: Diagnosis of  By: Sidney Ace    . OBSTRUCTIVE SLEEP APNEA 02/16/2007   Auto CPAP 09/03/13 to 10/02/13 >> used on 30 of 30 nights with average 6 hrs and 3 min.  Average AHI is 3.1 with median CPAP 5 cm H2O and 95 th percentile CPAP 6 cm H2O.    . Occipital neuralgia of right side 01/06/2018  . OSA (obstructive sleep apnea)    cpap setting of 12  . Pain in joint of right hip 01/21/2018  . Palpitations 01/21/2011   48 hour Holter, 2015, scattered PACs and PVCs.   Marland Kitchen PONV (postoperative nausea and vomiting)    severe after every surgery  . Pulmonary hypertension, secondary    . RHINITIS 07/21/2010       . RUQ abdominal pain   . S/P left TKA 07/27/2011  . Sinus node dysfunction ([place]) 11/08/2015  . Sleep apnea   . Spinal stenosis of cervical region 11/07/2014  . Urinary frequency 12/09/2018  . VENTRICULAR HYPERTROPHY, LEFT 11/24/2008   Qualifier: Diagnosis of  By: Sidney Ace      Past Surgical History:  Procedure Laterality Date  . ABDOMINAL HYSTERECTOMY     with appendectomy and colon polypectomy  . ANTERIOR CERVICAL DECOMP/DISCECTOMY FUSION N/A 11/07/2014   Procedure: Anterior Cervical diskectomy and fusion Cervical four-five, Cervical five-six, Cervical six-seven;  Surgeon: Kary Kos, MD;  Location: [facility];  Service: Neurosurgery;  Laterality: N/A;  . APPENDECTOMY    . ARTERY BIOPSY Right 02/16/2017   Procedure: BIOPSY TEMPORAL ARTERY;  Surgeon: Katha Cabal, MD;  Location: [facility] ORS;  Service: Vascular;  Laterality: Right;  . BACK SURGERY    . CARDIAC CATHETERIZATION    . CATARACT EXTRACTION W/ INTRAOCULAR LENS  IMPLANT, BILATERAL Bilateral   . CHOLECYSTECTOMY N/A 04/29/2016   Procedure: LAPAROSCOPIC CHOLECYSTECTOMY WITH INTRAOPERATIVE CHOLANGIOGRAM;  Surgeon: Alphonsa Overall, MD;  Location: [facility];  Service: General;  Laterality: N/A;  . EP IMPLANTABLE DEVICE N/A 11/08/2015   Procedure: Pacemaker Implant;  Surgeon: Deboraha Sprang, MD;  Location: [facility];  Service: Cardiovascular;  Laterality: N/A;  . INSERT / REPLACE / REMOVE PACEMAKER  2017   Dr. Jens Som [facility])  . JOINT REPLACEMENT    . LUMBAR DISC SURGERY  1990's   x 2  . THYROIDECTOMY  2011  . TOTAL KNEE ARTHROPLASTY  07/27/2011   Procedure: TOTAL KNEE ARTHROPLASTY;  Surgeon: Mauri Pole, MD;  Location: [facility];  Service: Orthopedics;  Laterality: Left;  . UPPER ESOPHAGEAL ENDOSCOPIC ULTRASOUND (EUS) N/A 05/14/2016   Procedure: UPPER ESOPHAGEAL ENDOSCOPIC ULTRASOUND (EUS);  Surgeon: Milus Banister, MD;  Location: [facility];  Service: Endoscopy;  Laterality: N/A;  radial  only-will be done mod if needed    Current Medications: Outpatient Medications Prior to Visit  Medication Sig Dispense Refill  . acetaminophen (TYLENOL) 500 MG tablet Take 1,000 mg by mouth every 4 (four) hours as needed for mild pain or moderate pain.     Marland Kitchen albuterol (PROVENTIL) (2.5 MG/3ML) 0.083% nebulizer solution Take 2.5 mg by nebulization every 6 (six) hours as needed for wheezing or shortness of breath.    Marland Kitchen albuterol (VENTOLIN HFA) 108 (90 Base) MCG/ACT inhaler Inhale 2 puffs into the lungs every 6 (six) hours as needed for wheezing or shortness of breath. 3 Inhaler 3  .  apixaban (ELIQUIS) 5 MG TABS tablet Take 1 tablet (5 mg total) by mouth 2 (two) times daily. 180 tablet 2  . atorvastatin (LIPITOR) 40 MG tablet Take 1 tablet (40 mg total) by mouth daily. 90 tablet 3  . budesonide-formoterol (SYMBICORT) 160-4.5 MCG/ACT inhaler Inhale 2 puffs into the lungs 2 (two) times daily for 1 day. 3 Inhaler 3  . cholecalciferol (VITAMIN D) 1000 units tablet Take 1,000 Units by mouth daily.    . fluticasone (FLONASE) 50 MCG/ACT nasal spray USE 2 SPRAYS NASALLY DAILY 48 g 1  . furosemide (LASIX) 40 MG tablet TAKE 1 TABLET (40 MG TOTAL) BY MOUTH DAILY. 90 tablet 0  . gabapentin (NEURONTIN) 800 MG tablet Take 800 mg by mouth 3 (three) times daily.     Marland Kitchen HYDROcodone-acetaminophen (NORCO/VICODIN) 5-325 MG tablet Take 1-2 tablets by mouth every 6 (six) hours as needed for moderate pain. 30 tablet 0  . insulin aspart (NOVOLOG FLEXPEN) 100 UNIT/ML FlexPen Inject 6-10 Units into the skin 3 (three) times daily as needed for high blood sugar (CBG >130).    . insulin glargine (LANTUS) 100 UNIT/ML injection Inject 100 Units into the skin 2 (two) times daily.     Marland Kitchen levothyroxine (SYNTHROID) 175 MCG tablet     . LORazepam (ATIVAN) 2 MG tablet Take 2 mg by mouth at bedtime as needed for anxiety (depression).     Marland Kitchen losartan (COZAAR) 100 MG tablet Take 100 mg by mouth daily.     . metoprolol tartrate (LOPRESSOR)  25 MG tablet Take 50 mg in the morning and 25 mg at night    . Phenylephrine-Chlorphen-DM (TUSS-DM PO) Take by mouth.    Marland Kitchen PRESCRIPTION MEDICATION Inhale into the lungs at bedtime. CPAP    . promethazine (PHENADOZ) 25 MG suppository Place 25 mg rectally daily as needed (headache).    . SUMAtriptan (IMITREX) 25 MG tablet Take 25 mg by mouth See admin instructions. Take one tablet (25 mg) by mouth at onset of migraine headache. May repeat in 2 hours if needed.    . TRUE METRIX BLOOD GLUCOSE TEST test strip     . umeclidinium bromide (INCRUSE ELLIPTA) 62.5 MCG/INH AEPB Inhale 1 puff into the lungs daily. 30 each 0  . vitamin B-12 (CYANOCOBALAMIN) 1000 MCG tablet Take 1,000 mcg by mouth daily.    Marland Kitchen amLODipine (NORVASC) 10 MG tablet Take 10 mg by mouth daily.    . nitroGLYCERIN (NITROSTAT) 0.4 MG SL tablet Place 1 tablet (0.4 mg total) under the tongue every 5 (five) minutes as needed for chest pain. 25 tablet 3  . levothyroxine (SYNTHROID) 125 MCG tablet Take 125 mcg by mouth daily before breakfast.     Facility-Administered Medications Prior to Visit  Medication Dose Route Frequency Provider Last Rate Last Admin  . 0.9 %  sodium chloride infusion  500 mL Intravenous Continuous Milus Banister, MD         Allergies:   Trulicity [dulaglutide]   Social History   Socioeconomic History  . Marital status: Married    Spouse name: Jeneen Rinks  . Number of children: 3  . Years of education: 44  . Highest education level: Not on file  Occupational History  . Occupation: retired  Tobacco Use  . Smoking status: Former Smoker    Packs/day: 2.00    Years: 33.00    Pack years: 66.00    Types: Cigarettes    Start date: 1966    Quit date: 05/04/1997    Years  since quitting: 21.9  . Smokeless tobacco: Never Used  Substance and Sexual Activity  . Alcohol use: No  . Drug use: No  . Sexual activity: Not on file  Other Topics Concern  . Not on file  Social History Narrative   Patient lives at home with  her husband Jeneen Rinks .   Retired.   Education 12 th grade.   Right handed   Caffeine two cups of coffee.   Social Determinants of Health   Financial Resource Strain:   . Difficulty of Paying Living Expenses: Not on file  Food Insecurity:   . Worried About Charity fundraiser in the Last Year: Not on file  . Ran Out of Food in the Last Year: Not on file  Transportation Needs:   . Lack of Transportation (Medical): Not on file  . Lack of Transportation (Non-Medical): Not on file  Physical Activity:   . Days of Exercise per Week: Not on file  . Minutes of Exercise per Session: Not on file  Stress:   . Feeling of Stress : Not on file  Social Connections:   . Frequency of Communication with Friends and Family: Not on file  . Frequency of Social Gatherings with Friends and Family: Not on file  . Attends Religious Services: Not on file  . Active Member of Clubs or Organizations: Not on file  . Attends Archivist Meetings: Not on file  . Marital Status: Not on file     Family History:  The patient's family history includes Diabetes in her maternal aunt and maternal uncle; Heart Problems in her father and mother; Heart defect in her mother; Heart disease in her brother; Lung cancer in her daughter; Throat cancer in her brother.   ROS:   Please see the history of present illness.   Review of Systems  All other systems reviewed and are negative.     PHYSICAL EXAM:   VS:  BP (!) 142/60   Pulse 90   Ht 5\' 5"  (1.651 m)   Wt 240 lb (108.9 kg)   SpO2 95%   BMI 39.94 kg/m    GEN: Well nourished, well developed, in no acute distress, overweight HEENT: scar noted Neck: no JVD, carotid bruits, or masses Cardiac: RRR; no murmurs, rubs, or gallops,no edema, pacemaker on Left Respiratory:  clear to auscultation bilaterally, normal work of breathing GI: soft, nontender, nondistended, + BS MS: no deformity or atrophy  Skin: warm and dry, no rash, upper arm varicosity Neuro:  Alert  and Oriented x 3, Strength and sensation are intact Psych: euthymic mood, full affect    Wt Readings from Last 3 Encounters:  04/13/19 240 lb (108.9 kg)  12/20/18 238 lb (108 kg)  12/09/18 239 lb 6.4 oz (108.6 kg)      Studies/Labs Reviewed:   EKG:   08/22/15-sinus bradycardia with first-degree AV block of 216 ms, no other abnormalities. Personally viewed.  Recent Labs: 11/14/2018: Hemoglobin 11.2; Platelets 426 12/09/2018: Pro B Natriuretic peptide (BNP) 121.0 12/20/2018: BUN 25; Creatinine, Ser 1.18; Magnesium 1.7; Potassium 4.8; Sodium 142   Lipid Panel No results found for: CHOL, TRIG, HDL, CHOLHDL, VLDL, LDLCALC, LDLDIRECT  Additional studies/ records that were reviewed today include:  Former office notes, echocardiogram, carotid Dopplers reviewed  2015 ECHO: - Left ventricle: The cavity size was normal. Wall thickness was increased in a pattern of mild LVH. There was mild focal basal hypertrophy of the septum. Systolic function was normal. The estimated ejection fraction  was in the range of 55% to 60%. Wall motion was normal; there were no regional wall motion abnormalities. Doppler parameters are consistent with abnormal left ventricular relaxation (grade 1 diastolic dysfunction). - Left atrium: The atrium was mildly dilated. - Atrial septum: There was increased thickness of the septum, consistent with lipomatous hypertrophy.  Coronary CT scan 12/31/2017: 1. Coronary calcium score of 1422. This was 70 percentile for age and sex matched control.  2. Normal coronary origin with right dominance.  3. Diffuse plaque with moderate disease in the RCA and LAD. Additional analysis with CT FFR will be submitted.  ASSESSMENT:    1. Cardiac pacemaker in situ   2. PVC (premature ventricular contraction)   3. Paroxysmal atrial fibrillation (HCC)   4. Chest pain, unspecified type      PLAN:  In order of problems listed above:  PAF  - now on Eliquis.   Atrial fibrillation was detected on a device interrogation.  Thank you Dr. Caryl Comes.  Lower extremity edema  - improved with decreased amlodipine.  5 mg.  Continue to monitor.  Nonobstructive coronary artery disease -Continue with current medical management, optimize diabetes management, statin, blood pressure control. Overall she is having no anginal symptoms. Stress test in 2013 was low risk, reassuring. Normal ejection fraction.  She had significantly high coronary calcium score of 1300 but FFR analysis was overall reassuring with only small vessel mild flow limitation in an obtuse marginal branch.  Continue with medical therapy.  She has been having some chest discomfort, could be coming from her pleural cavity.  Lets see how she fares over the next few months.    Pacemaker-[employer] - Dr. Caryl Comes. Doing well. - Seems to be main active cardiology issue at this point.   - Metoprolol 25 mg twice a day. This will help with her palpitations as well. She has a pacemaker for backup. It is like this at home.  Palpitations -PVCs/PACs seen on prior event monitor. Continue with metoprolol. No changes. Has pacemaker for backup.  No recent complaints.  Diabetes with peripheral neuropathy -Gabapentin.  Continue to monitor.  Morbid obesity -Continue to encourage weight loss. This will continue to help her overall.  This will also help with blood pressure generally.  No changes made  Carotid artery disease -Carotid Dopplers from 08/2015 overall in the mild plaque range bilaterally. We will follow-up with carotid Dopplers on an as-needed basis at this point. I do not appreciate any significant bruits on exam. Prevention, diet, exercise, BP control, lipid control.  No changes.  Continue with aggressive secondary prevention.  Essential hypertension -Currently medications reviewed. --Norvasc 5 mg works better for her lower extremity edema.  Chronic kidney disease stage III -Dr. Lisbeth Ply is watching  creatinine closely especially with her spironolactone.  Last creatinine 1.2  COPD -Dr.Sood one-year follow-up.    Medication Adjustments/Labs and Tests Ordered: Current medicines are reviewed at length with the patient today.  Concerns regarding medicines are outlined above.  Medication changes, Labs and Tests ordered today are listed in the Patient Instructions below. Patient Instructions  Medication Instructions:  The current medical regimen is effective;  continue present plan and medications.  *If you need a refill on your cardiac medications before your next appointment, please call your pharmacy*  Follow-Up: At [facility], you and your health needs are our priority.  As part of our continuing mission to provide you with exceptional heart care, we have created designated Provider Care Teams.  These Care Teams include your primary  Cardiologist (physician) and Advanced Practice Providers (APPs -  Physician Assistants and Nurse Practitioners) who all work together to provide you with the care you need, when you need it.  Your next appointment:   6 month(s)  The format for your next appointment:   In Person  Provider:   Candee Furbish, MD  Thank you for choosing [facility]!!        Signed, Candee Furbish, MD  04/13/2019 2:49 PM    [address], [place], [place]  [address] Phone: [phone]; Fax: [phone]

## 2019-04-14 DIAGNOSIS — E782 Mixed hyperlipidemia: Secondary | ICD-10-CM | POA: Diagnosis not present

## 2019-04-14 DIAGNOSIS — I10 Essential (primary) hypertension: Secondary | ICD-10-CM | POA: Diagnosis not present

## 2019-04-14 DIAGNOSIS — E1122 Type 2 diabetes mellitus with diabetic chronic kidney disease: Secondary | ICD-10-CM | POA: Diagnosis not present

## 2019-04-14 DIAGNOSIS — J449 Chronic obstructive pulmonary disease, unspecified: Secondary | ICD-10-CM | POA: Diagnosis not present

## 2019-04-14 DIAGNOSIS — Z79899 Other long term (current) drug therapy: Secondary | ICD-10-CM | POA: Diagnosis not present

## 2019-04-14 DIAGNOSIS — E1121 Type 2 diabetes mellitus with diabetic nephropathy: Secondary | ICD-10-CM | POA: Diagnosis not present

## 2019-04-14 DIAGNOSIS — Z794 Long term (current) use of insulin: Secondary | ICD-10-CM | POA: Diagnosis not present

## 2019-04-14 DIAGNOSIS — N183 Chronic kidney disease, stage 3 unspecified: Secondary | ICD-10-CM | POA: Diagnosis not present

## 2019-04-14 DIAGNOSIS — M25572 Pain in left ankle and joints of left foot: Secondary | ICD-10-CM | POA: Diagnosis not present

## 2019-04-14 DIAGNOSIS — E890 Postprocedural hypothyroidism: Secondary | ICD-10-CM | POA: Diagnosis not present

## 2019-04-14 DIAGNOSIS — Z139 Encounter for screening, unspecified: Secondary | ICD-10-CM | POA: Diagnosis not present

## 2019-04-20 ENCOUNTER — Ambulatory Visit (INDEPENDENT_AMBULATORY_CARE_PROVIDER_SITE_OTHER): Payer: Medicare HMO | Admitting: Podiatry

## 2019-04-20 ENCOUNTER — Other Ambulatory Visit: Payer: Self-pay

## 2019-04-20 ENCOUNTER — Encounter: Payer: Self-pay | Admitting: Podiatry

## 2019-04-20 VITALS — Temp 97.4°F

## 2019-04-20 DIAGNOSIS — M76822 Posterior tibial tendinitis, left leg: Secondary | ICD-10-CM | POA: Diagnosis not present

## 2019-04-21 NOTE — Progress Notes (Signed)
Remote pacemaker transmission.   

## 2019-04-24 NOTE — Progress Notes (Signed)
Subjective:   Patient ID: Kayla [name], female   DOB: 78 y.o.   MRN: [id number]   HPI Patient presents stating still having a lot of pain in the left ankle and she feels very unstable and feels like she cannot bear weight on her foot currently   ROS      Objective:  Physical Exam  Neurovascular status intact with inflammation pain of the posterior tibial tendon which so far has not responded conservatively to bracing injection treatment     Assessment:  Acute posterior tibial tendinitis with flatfoot deformity is complicating factor     Plan:  Sterile prep and injected more distal near the insertion of the posterior tibial tendon and applied a air fracture walker to completely immobilize the foot take all pressure off the tendon and advised what to do and how to be careful with this.  Patient will be seen back 4 weeks or earlier if symptoms do not respond to this conservative treatment and may require MRI

## 2019-04-29 DIAGNOSIS — G4733 Obstructive sleep apnea (adult) (pediatric): Secondary | ICD-10-CM | POA: Diagnosis not present

## 2019-05-15 DIAGNOSIS — J449 Chronic obstructive pulmonary disease, unspecified: Secondary | ICD-10-CM | POA: Diagnosis not present

## 2019-05-15 DIAGNOSIS — I10 Essential (primary) hypertension: Secondary | ICD-10-CM | POA: Diagnosis not present

## 2019-05-15 DIAGNOSIS — Z6838 Body mass index (BMI) 38.0-38.9, adult: Secondary | ICD-10-CM | POA: Diagnosis not present

## 2019-05-15 DIAGNOSIS — E790 Hyperuricemia without signs of inflammatory arthritis and tophaceous disease: Secondary | ICD-10-CM | POA: Diagnosis not present

## 2019-05-15 DIAGNOSIS — Z79899 Other long term (current) drug therapy: Secondary | ICD-10-CM | POA: Diagnosis not present

## 2019-05-30 DIAGNOSIS — G4733 Obstructive sleep apnea (adult) (pediatric): Secondary | ICD-10-CM | POA: Diagnosis not present

## 2019-06-16 ENCOUNTER — Ambulatory Visit (INDEPENDENT_AMBULATORY_CARE_PROVIDER_SITE_OTHER): Payer: Medicare HMO

## 2019-06-16 ENCOUNTER — Ambulatory Visit: Payer: Medicare HMO | Admitting: Podiatry

## 2019-06-16 ENCOUNTER — Other Ambulatory Visit: Payer: Self-pay

## 2019-06-16 VITALS — Temp 97.3°F

## 2019-06-16 DIAGNOSIS — L97529 Non-pressure chronic ulcer of other part of left foot with unspecified severity: Secondary | ICD-10-CM | POA: Diagnosis not present

## 2019-06-16 DIAGNOSIS — M79675 Pain in left toe(s): Secondary | ICD-10-CM

## 2019-06-16 DIAGNOSIS — M79674 Pain in right toe(s): Secondary | ICD-10-CM | POA: Diagnosis not present

## 2019-06-16 DIAGNOSIS — E1142 Type 2 diabetes mellitus with diabetic polyneuropathy: Secondary | ICD-10-CM

## 2019-06-16 DIAGNOSIS — E11621 Type 2 diabetes mellitus with foot ulcer: Secondary | ICD-10-CM | POA: Diagnosis not present

## 2019-06-16 DIAGNOSIS — B351 Tinea unguium: Secondary | ICD-10-CM | POA: Diagnosis not present

## 2019-06-16 DIAGNOSIS — L02612 Cutaneous abscess of left foot: Secondary | ICD-10-CM

## 2019-06-16 DIAGNOSIS — L03032 Cellulitis of left toe: Secondary | ICD-10-CM | POA: Diagnosis not present

## 2019-06-16 DIAGNOSIS — E119 Type 2 diabetes mellitus without complications: Secondary | ICD-10-CM

## 2019-06-16 MED ORDER — DOXYCYCLINE HYCLATE 100 MG PO CAPS: 100.0000 mg | ORAL_CAPSULE | Freq: Two times a day (BID) | ORAL | 0 refills | Status: AC

## 2019-06-16 NOTE — Patient Instructions (Addendum)
DRESSING CHANGES LEFT FOOT:  WEAR SURGICAL SHOE AT ALL TIMES    1. KEEP LEFT  FOOT DRY AT ALL TIMES!!!!  2. CLEANSE ULCER WITH SALINE.  3. DAB DRY WITH GAUZE SPONGE.  4. APPLY A LIGHT AMOUNT OF MEDIHONEY TO BASE OF ULCER.  5. APPLY OUTER DRESSING/BAND-AID AS INSTRUCTED.  6. WEAR SURGICAL SHOE DAILY AT ALL TIMES.  7. DO NOT WALK BAREFOOT!!!  8.  IF YOU EXPERIENCE ANY FEVER, CHILLS, NIGHTSWEATS, NAUSEA OR VOMITING, ELEVATED OR LOW BLOOD SUGARS, REPORT TO EMERGENCY ROOM.  9. IF YOU EXPERIENCE INCREASED REDNESS, PAIN, SWELLING, DISCOLORATION, ODOR, PUS, DRAINAGE OR WARMTH OF YOUR FOOT, REPORT TO EMERGENCY ROOM.  Diabetes Mellitus and Foot Care Foot care is an important part of your health, especially when you have diabetes. Diabetes may cause you to have problems because of poor blood flow (circulation) to your feet and legs, which can cause your skin to:  Become thinner and drier.  Break more easily.  Heal more slowly.  Peel and crack. You may also have nerve damage (neuropathy) in your legs and feet, causing decreased feeling in them. This means that you may not notice minor injuries to your feet that could lead to more serious problems. Noticing and addressing any potential problems early is the best way to prevent future foot problems. How to care for your feet Foot hygiene  Wash your feet daily with warm water and mild soap. Do not use hot water. Then, pat your feet and the areas between your toes until they are completely dry. Do not soak your feet as this can dry your skin.  Trim your toenails straight across. Do not dig under them or around the cuticle. File the edges of your nails with an emery board or nail file.  Apply a moisturizing lotion or petroleum jelly to the skin on your feet and to dry, brittle toenails. Use lotion that does not contain alcohol and is unscented. Do not apply lotion between your toes. Shoes and socks  Wear clean socks or stockings every day.  Make sure they are not too tight. Do not wear knee-high stockings since they may decrease blood flow to your legs.  Wear shoes that fit properly and have enough cushioning. Always look in your shoes before you put them on to be sure there are no objects inside.  To break in new shoes, wear them for just a few hours a day. This prevents injuries on your feet. Wounds, scrapes, corns, and calluses  Check your feet daily for blisters, cuts, bruises, sores, and redness. If you cannot see the bottom of your feet, use a mirror or ask someone for help.  Do not cut corns or calluses or try to remove them with medicine.  If you find a minor scrape, cut, or break in the skin on your feet, keep it and the skin around it clean and dry. You may clean these areas with mild soap and water. Do not clean the area with peroxide, alcohol, or iodine.  If you have a wound, scrape, corn, or callus on your foot, look at it several times a day to make sure it is healing and not infected. Check for: ? Redness, swelling, or pain. ? Fluid or blood. ? Warmth. ? Pus or a bad smell. General instructions  Do not cross your legs. This may decrease blood flow to your feet.  Do not use heating pads or hot water bottles on your feet. They may burn your skin. If  you have lost feeling in your feet or legs, you may not know this is happening until it is too late.  Protect your feet from hot and cold by wearing shoes, such as at the beach or on hot pavement.  Schedule a complete foot exam at least once a year (annually) or more often if you have foot problems. If you have foot problems, report any cuts, sores, or bruises to your health care provider immediately. Contact a health care provider if:  You have a medical condition that increases your risk of infection and you have any cuts, sores, or bruises on your feet.  You have an injury that is not healing.  You have redness on your legs or feet.  You feel burning or  tingling in your legs or feet.  You have pain or cramps in your legs and feet.  Your legs or feet are numb.  Your feet always feel cold.  You have pain around a toenail. Get help right away if:  You have a wound, scrape, corn, or callus on your foot and: ? You have pain, swelling, or redness that gets worse. ? You have fluid or blood coming from the wound, scrape, corn, or callus. ? Your wound, scrape, corn, or callus feels warm to the touch. ? You have pus or a bad smell coming from the wound, scrape, corn, or callus. ? You have a fever. ? You have a red line going up your leg. Summary  Check your feet every day for cuts, sores, red spots, swelling, and blisters.  Moisturize feet and legs daily.  Wear shoes that fit properly and have enough cushioning.  If you have foot problems, report any cuts, sores, or bruises to your health care provider immediately.  Schedule a complete foot exam at least once a year (annually) or more often if you have foot problems. This information is not intended to replace advice given to you by your health care provider. Make sure you discuss any questions you have with your health care provider. Document Revised: 01/11/2019 Document Reviewed: 05/22/2016 Elsevier Patient Education  [place].

## 2019-06-18 ENCOUNTER — Encounter: Payer: Self-pay | Admitting: Podiatry

## 2019-06-18 NOTE — Progress Notes (Signed)
Subjective: Patient presents today for diabetic preventative foot care for painful, discolored, thick toenails which interfere with ambulation. Pain is resolved with periodic professional debridement.  New concern: painful left hallux. Patient states her left great toe is painful, red and swollen. She states she has been soaking her foot in epsom salt and warm water. She states she peeled off a small piece of hanging skin. She denies any fever, night sweats or vomiting. She did relate one episode of chills, but attributed it to her receiving her COVID-19 vaccination.             Hamrick, Lorin Mercy, MD is her PCP.  Current Outpatient Medications on File Prior to Visit  Medication Sig Dispense Refill  . acetaminophen (TYLENOL) 500 MG tablet Take 1,000 mg by mouth every 4 (four) hours as needed for mild pain or moderate pain.     Marland Kitchen albuterol (PROVENTIL) (2.5 MG/3ML) 0.083% nebulizer solution Take 2.5 mg by nebulization every 6 (six) hours as needed for wheezing or shortness of breath.    Marland Kitchen albuterol (VENTOLIN HFA) 108 (90 Base) MCG/ACT inhaler Inhale 2 puffs into the lungs every 6 (six) hours as needed for wheezing or shortness of breath. 3 Inhaler 3  . allopurinol (ZYLOPRIM) 100 MG tablet     . amLODipine (NORVASC) 5 MG tablet Take 1 tablet (5 mg total) by mouth daily. 90 tablet 3  . apixaban (ELIQUIS) 5 MG TABS tablet Take 1 tablet (5 mg total) by mouth 2 (two) times daily. 180 tablet 2  . atorvastatin (LIPITOR) 40 MG tablet Take 1 tablet (40 mg total) by mouth daily. 90 tablet 3  . budesonide-formoterol (SYMBICORT) 160-4.5 MCG/ACT inhaler Inhale 2 puffs into the lungs 2 (two) times daily for 1 day. 3 Inhaler 3  . cholecalciferol (VITAMIN D) 1000 units tablet Take 1,000 Units by mouth daily.    . fluticasone (FLONASE) 50 MCG/ACT nasal spray USE 2 SPRAYS NASALLY DAILY 48 g 1  . furosemide (LASIX) 40 MG tablet TAKE 1 TABLET (40 MG TOTAL) BY MOUTH DAILY. 90 tablet 0  . gabapentin (NEURONTIN)  800 MG tablet Take 800 mg by mouth 3 (three) times daily.     Marland Kitchen HYDROcodone-acetaminophen (NORCO/VICODIN) 5-325 MG tablet Take 1-2 tablets by mouth every 6 (six) hours as needed for moderate pain. (Patient taking differently: Take 1-2 tablets by mouth every 6 (six) hours as needed for moderate pain. Takes for back pain) 30 tablet 0  . insulin aspart (NOVOLOG FLEXPEN) 100 UNIT/ML FlexPen Inject 6-10 Units into the skin 3 (three) times daily as needed for high blood sugar (CBG >130).    . insulin glargine (LANTUS) 100 UNIT/ML injection Inject 100 Units into the skin 2 (two) times daily.     Marland Kitchen levothyroxine (SYNTHROID) 175 MCG tablet     . LORazepam (ATIVAN) 2 MG tablet Take 2 mg by mouth at bedtime as needed for anxiety (depression).     Marland Kitchen losartan (COZAAR) 100 MG tablet Take 100 mg by mouth daily.     . metoprolol tartrate (LOPRESSOR) 25 MG tablet Take 50 mg in the morning and 25 mg at night    . nitroGLYCERIN (NITROSTAT) 0.4 MG SL tablet Place 1 tablet (0.4 mg total) under the tongue every 5 (five) minutes as needed for chest pain. 25 tablet 4  . Phenylephrine-Chlorphen-DM (TUSS-DM PO) Take by mouth.    Marland Kitchen PRESCRIPTION MEDICATION Inhale into the lungs at bedtime. CPAP    . promethazine (PHENADOZ) 25 MG suppository Place 25  mg rectally daily as needed (headache).    Marland Kitchen spironolactone (ALDACTONE) 25 MG tablet     . SUMAtriptan (IMITREX) 25 MG tablet Take 25 mg by mouth See admin instructions. Take one tablet (25 mg) by mouth at onset of migraine headache. May repeat in 2 hours if needed.    . TRUE METRIX BLOOD GLUCOSE TEST test strip     . umeclidinium bromide (INCRUSE ELLIPTA) 62.5 MCG/INH AEPB Inhale 1 puff into the lungs daily. 30 each 0  . vitamin B-12 (CYANOCOBALAMIN) 1000 MCG tablet Take 1,000 mcg by mouth daily.     Current Facility-Administered Medications on File Prior to Visit  Medication Dose Route Frequency Provider Last Rate Last Admin  . 0.9 %  sodium chloride infusion  500 mL  Intravenous Continuous Milus Banister, MD         Allergies  Allergen Reactions  . Trulicity [Dulaglutide] Nausea And Vomiting    Objective: Vitals:   06/16/19 2118  Temp: (!) 97.3 F (36.3 C)   Capillary fill time to digits <3s b/l, palpable DP pulses b/l, non-palpable PT pulses b/l, pedal hair absent b/l and skin temperature gradient warm to cool b/l  Pedal skin is thin shiny, atrophic bilaterally, no interdigital macerations bilaterally, toenails 1-5 b/l elongated, dystrophic, thickened, crumbly with subungual debris and Ulceration located left hallux. Predebridement measurements carried out today of 2.0 x 1.5 cm left medial hallux and 1.5 x 1.5 x 0.1 cm plantar left hallux. +Periculcerative erythema, + edema. No flocculence, no malodor. Roof of ulcer is hyperkeratotic medially. Postdebridement measurements are 2.0 x 1.5x 0.1 cm medially and 1.5 x 1.5 x 0.1 cm cm. Postdebridement, there is no undermining, no tunneling, no visible joint or bone exposure, no probing to bone, no odor. Base of ulcer is noted to be fibrogranular.     Normal muscle strength 5/5 to all lower extremity muscle groups bilaterally, no pain crepitus or joint limitation noted with ROM b/l, bunion deformity noted b/l, hammertoes noted to the  2-5 bilaterally and pes planus deformity noted  Protective sensation decreased with 10 gram monofilament b/l and vibratory sensation absent b/l  Assessment: Pain due to onychomycosis of toenails of both feet  Diabetic ulcer of left great toe (HCC) - Plan: DG Foot Complete Left, doxycycline (VIBRAMYCIN) 100 MG capsule  Cellulitis and abscess of toe of left foot - Plan: DG Foot Complete Left, doxycycline (VIBRAMYCIN) 100 MG capsule  Diabetic peripheral neuropathy associated with type 2 diabetes mellitus ([place])  Encounter for diabetic foot exam ([place])  Plan: -Continue diabetic foot care principles. Literature dispensed on today.  -Toenails 1-5 b/l were debrided in length  and girth without iatrogenic bleeding. -Ulcer was debrided of nonviable necrotic tissue and was resected to the level of subcutaneous tissue. Ulcer was cleansed with wound cleanser. Silvadene Cream was applied to base of wound with light dressing. -Xray of right foot was performed and reviewed with patient and/or POA. -Surgical shoe was dispensed for right foot. -Patient was given written instructions on daily Medihoney dressing changes was instructed to call immediately if any signs or symptoms of infection arise.  -Patient instructed to report to emergency department with worsening appearance of ulcer/toe/foot, increased pain, foul odor, increased redness, swelling, drainage, fever, chills, nightsweats, nausea, vomiting, increased blood sugar.  -Rx for Doxycycline 100 mg, #20, to be taken BID for 10 days. -She is to follow up with Dr. March Rummage next week for left hallux ulceration. -Patient/POA to call should there be question/concern in the  interim.

## 2019-06-22 ENCOUNTER — Ambulatory Visit: Payer: Medicare HMO | Admitting: Podiatry

## 2019-06-22 ENCOUNTER — Ambulatory Visit (INDEPENDENT_AMBULATORY_CARE_PROVIDER_SITE_OTHER): Payer: Medicare HMO | Admitting: *Deleted

## 2019-06-22 DIAGNOSIS — I495 Sick sinus syndrome: Secondary | ICD-10-CM

## 2019-06-22 LAB — CUP PACEART REMOTE DEVICE CHECK
Battery Remaining Longevity: 71 mo
Battery Voltage: 3.01 V
Brady Statistic AP VP Percent: 0.26 %
Brady Statistic AP VS Percent: 83.4 %
Brady Statistic AS VP Percent: 4.63 %
Brady Statistic AS VS Percent: 11.71 %
Brady Statistic RA Percent Paced: 77.04 %
Brady Statistic RV Percent Paced: 5.04 %
Date Time Interrogation Session: 20210218084121
Implantable Lead Implant Date: 20170707
Implantable Lead Implant Date: 20170707
Implantable Lead Location: 753859
Implantable Lead Location: 753860
Implantable Lead Model: 3830
Implantable Lead Model: 5076
Implantable Pulse Generator Implant Date: 20170707
Lead Channel Impedance Value: 285 Ohm
Lead Channel Impedance Value: 361 Ohm
Lead Channel Impedance Value: 380 Ohm
Lead Channel Impedance Value: 399 Ohm
Lead Channel Pacing Threshold Amplitude: 1 V
Lead Channel Pacing Threshold Amplitude: 1.875 V
Lead Channel Pacing Threshold Pulse Width: 0.4 ms
Lead Channel Pacing Threshold Pulse Width: 0.4 ms
Lead Channel Sensing Intrinsic Amplitude: 4.75 mV
Lead Channel Sensing Intrinsic Amplitude: 4.75 mV
Lead Channel Sensing Intrinsic Amplitude: 5.875 mV
Lead Channel Sensing Intrinsic Amplitude: 5.875 mV
Lead Channel Setting Pacing Amplitude: 2 V
Lead Channel Setting Pacing Amplitude: 2.5 V
Lead Channel Setting Pacing Pulse Width: 1 ms
Lead Channel Setting Sensing Sensitivity: 2.8 mV

## 2019-06-22 NOTE — Progress Notes (Signed)
PPM Remote  

## 2019-06-23 ENCOUNTER — Ambulatory Visit: Payer: Medicare HMO | Admitting: Podiatry

## 2019-06-23 ENCOUNTER — Other Ambulatory Visit: Payer: Self-pay

## 2019-06-23 VITALS — Temp 96.0°F

## 2019-06-23 DIAGNOSIS — E11621 Type 2 diabetes mellitus with foot ulcer: Secondary | ICD-10-CM

## 2019-06-23 DIAGNOSIS — L97529 Non-pressure chronic ulcer of other part of left foot with unspecified severity: Secondary | ICD-10-CM

## 2019-06-30 DIAGNOSIS — G4733 Obstructive sleep apnea (adult) (pediatric): Secondary | ICD-10-CM | POA: Diagnosis not present

## 2019-07-05 ENCOUNTER — Other Ambulatory Visit: Payer: Self-pay

## 2019-07-05 ENCOUNTER — Ambulatory Visit (INDEPENDENT_AMBULATORY_CARE_PROVIDER_SITE_OTHER): Payer: Medicare HMO | Admitting: Podiatry

## 2019-07-05 ENCOUNTER — Ambulatory Visit (INDEPENDENT_AMBULATORY_CARE_PROVIDER_SITE_OTHER): Payer: Medicare HMO

## 2019-07-05 VITALS — Temp 97.5°F

## 2019-07-05 DIAGNOSIS — E11621 Type 2 diabetes mellitus with foot ulcer: Secondary | ICD-10-CM

## 2019-07-05 DIAGNOSIS — L97529 Non-pressure chronic ulcer of other part of left foot with unspecified severity: Secondary | ICD-10-CM

## 2019-07-05 MED ORDER — SILVER SULFADIAZINE 1 % EX CREA: TOPICAL_CREAM | CUTANEOUS | 0 refills | Status: DC

## 2019-07-05 NOTE — Progress Notes (Signed)
  Subjective:  Patient ID: Kayla [name], female    DOB: 10-27-1940,  MRN: [id number]  Chief Complaint  Patient presents with  . Wound Check    Pt stated, "No unusual drainage/fever/chills/N&V. I've been cleaning and dressing the area every day as instructed. Pain = 8/10 every day, but most of that pain is coming from my ankle. That has been an ongoing, different issue. A1c = 8, but I was on prednisone for awhile. Today's AM glucose = 97mg /dL".    79 y.o. female presents for wound care. Hx confirmed with patient.  Objective:  Physical Exam: Wound Location: left hallux  Wound Measurement: 1x0.5 Wound Base: Granular/Healthy Peri-wound: Calloused Exudate: None: wound tissue dry wound without warmth, erythema, signs of acute infection  Assessment:   1. Diabetic ulcer of left great toe [facility])    Plan:  Patient was evaluated and treated and all questions answered.  Ulcer left hallux -Dressing applied consisting of betadine -Wound cleansed and debrided -No signs of continued infection  Procedure: Selective Debridement of Wound Rationale: Removal of devitalized tissue from the wound to promote healing.  Pre-Debridement Wound Measurements: 1 cm x 0.5 cm x 0.1 cm  Post-Debridement Wound Measurements: same as pre-debridement. Type of Debridement: sharp selective Tissue Removed: Devitalized soft-tissue Dressing: Dry, sterile, compression dressing. Disposition: Patient tolerated procedure well. Patient to return in 1 week for follow-up.

## 2019-07-07 ENCOUNTER — Ambulatory Visit: Payer: Medicare HMO | Admitting: Podiatry

## 2019-07-19 ENCOUNTER — Ambulatory Visit: Payer: Medicare HMO | Admitting: Podiatry

## 2019-07-20 DIAGNOSIS — E790 Hyperuricemia without signs of inflammatory arthritis and tophaceous disease: Secondary | ICD-10-CM | POA: Diagnosis not present

## 2019-07-20 DIAGNOSIS — Z794 Long term (current) use of insulin: Secondary | ICD-10-CM | POA: Diagnosis not present

## 2019-07-20 DIAGNOSIS — I480 Paroxysmal atrial fibrillation: Secondary | ICD-10-CM | POA: Diagnosis not present

## 2019-07-20 DIAGNOSIS — J449 Chronic obstructive pulmonary disease, unspecified: Secondary | ICD-10-CM | POA: Diagnosis not present

## 2019-07-20 DIAGNOSIS — E1165 Type 2 diabetes mellitus with hyperglycemia: Secondary | ICD-10-CM | POA: Diagnosis not present

## 2019-07-20 DIAGNOSIS — E1149 Type 2 diabetes mellitus with other diabetic neurological complication: Secondary | ICD-10-CM | POA: Diagnosis not present

## 2019-07-20 DIAGNOSIS — M545 Low back pain: Secondary | ICD-10-CM | POA: Diagnosis not present

## 2019-07-20 DIAGNOSIS — G8929 Other chronic pain: Secondary | ICD-10-CM | POA: Diagnosis not present

## 2019-07-20 DIAGNOSIS — Z79899 Other long term (current) drug therapy: Secondary | ICD-10-CM | POA: Diagnosis not present

## 2019-07-31 ENCOUNTER — Other Ambulatory Visit: Payer: Self-pay

## 2019-07-31 ENCOUNTER — Ambulatory Visit: Payer: Medicare HMO | Admitting: Podiatry

## 2019-07-31 DIAGNOSIS — M79676 Pain in unspecified toe(s): Secondary | ICD-10-CM | POA: Diagnosis not present

## 2019-07-31 DIAGNOSIS — E0843 Diabetes mellitus due to underlying condition with diabetic autonomic (poly)neuropathy: Secondary | ICD-10-CM

## 2019-07-31 DIAGNOSIS — L97522 Non-pressure chronic ulcer of other part of left foot with fat layer exposed: Secondary | ICD-10-CM

## 2019-07-31 DIAGNOSIS — B351 Tinea unguium: Secondary | ICD-10-CM | POA: Diagnosis not present

## 2019-08-02 NOTE — Progress Notes (Signed)
SUBJECTIVE Patient with a history of diabetes mellitus presents to office today complaining of elongated, thickened nails that cause pain while ambulating in shoes. She is unable to trim her own nails. She is also here for follow up evaluation of an ulceration noted to the left hallux. She states the wound looks the same and she does not believe it is improving. She reports some associated drainage from the wound. She has been using Silvadene cream as directed. There are no modifying factors noted. Patient is here for further evaluation and treatment.   Past Medical History:  Diagnosis Date  . Allergy   . Anxiety   . Arthritis   . CAD (coronary artery disease)    Mild per remote cath in 2006, /    Nuclear, January, 2013, no significant abnormality,  . Carotid artery disease ([place])    Doppler, January, 2013, 0 50% RIC A., 27-74% LICA, stable  . Cataract   . Complication of anesthesia    wakes up "mean"  . COPD 02/16/2007   Intolerant of Spiriva, tudorza Intolerant of Trelegy   . COPD (chronic obstructive pulmonary disease) ([place])   . Coronary atherosclerosis 11/24/2008   Catheterization 2006, nonobstructive coronary disease   //   nuclear 2013, low risk    . Diabetes mellitus, type 2 ([place])   . Diverticula of colon   . DIVERTICULOSIS OF COLON 03/23/2007   Qualifier: Diagnosis of  By: Halford Chessman MD, [place]    . Dizziness    occasional  . Dizzy spells 05/04/2011  . DM (diabetes mellitus) ([place]) 03/23/2007   Qualifier: Diagnosis of  By: Halford Chessman MD, Vine[place]    . Ejection fraction   . Essential hypertension 02/16/2007   Qualifier: Diagnosis of  By: Rosana Hoes CMA, Tammy    . G E R D 03/23/2007   Qualifier: Diagnosis of  By: Halford Chessman MD, Vineet    . Gall bladder disease 04/28/2016  . Gastritis and gastroduodenitis   . GERD (gastroesophageal reflux disease)   . Goiter    hx of  . Headache(784.0)    migraine  . Hyperlipidemia   . Hypertension   . Hypothyroidism   . HYPOTHYROIDISM, POSTSURGICAL  06/04/2008   Qualifier: Diagnosis of  By: Loanne Drilling MD, Jacelyn Pi   . Left ventricular hypertrophy   . Leg edema    occasional swelling  . Leg swelling 12/09/2018  . Myofascial pain syndrome, cervical 01/06/2018  . Obesity   . OBESITY 11/24/2008   Qualifier: Diagnosis of  By: Sidney Ace    . OBSTRUCTIVE SLEEP APNEA 02/16/2007   Auto CPAP 09/03/13 to 10/02/13 >> used on 30 of 30 nights with average 6 hrs and 3 min.  Average AHI is 3.1 with median CPAP 5 cm H2O and 95 th percentile CPAP 6 cm H2O.    . Occipital neuralgia of right side 01/06/2018  . OSA (obstructive sleep apnea)    cpap setting of 12  . Pain in joint of right hip 01/21/2018  . Palpitations 01/21/2011   48 hour Holter, 2015, scattered PACs and PVCs.   Marland Kitchen PONV (postoperative nausea and vomiting)    severe after every surgery  . Pulmonary hypertension, secondary   . RHINITIS 07/21/2010       . RUQ abdominal pain   . S/P left TKA 07/27/2011  . Sinus node dysfunction ([place]) 11/08/2015  . Sleep apnea   . Spinal stenosis of cervical region 11/07/2014  . Urinary frequency 12/09/2018  . VENTRICULAR HYPERTROPHY, LEFT 11/24/2008  Qualifier: Diagnosis of  By: Sidney Ace      OBJECTIVE General Patient is awake, alert, and oriented x 3 and in no acute distress.  Derm Wound #1 noted to the left hallux measuring approximately 1.5 x 0.8 x 0.2 cm.   To the above-noted ulceration, there is no eschar. There is a moderate amount of slough, fibrin and necrotic tissue. Granulation tissue and wound base is red. There is no malodor. There is a minimal amount of serosanginous drainage noted. Periwound integrity is intact.  Skin is dry and supple bilateral. Nails are tender, long, thickened and dystrophic with subungual debris, consistent with onychomycosis, 1-5 bilateral. No signs of infection noted.  Vasc  DP and PT pedal pulses palpable bilaterally. Temperature gradient within normal limits.   Neuro Epicritic and protective threshold sensation  diminished bilaterally.   Musculoskeletal Exam No symptomatic pedal deformities noted bilateral. Muscular strength within normal limits.  ASSESSMENT 1. Diabetes Mellitus w/ peripheral neuropathy 2. Onychomycosis of nail due to dermatophyte bilateral 3. Ulceration of the left hallux secondary to diabetes mellitus   PLAN OF CARE 1. Patient evaluated today. 2. Instructed to maintain good pedal hygiene and foot care. Stressed importance of controlling blood sugar.  3. Mechanical debridement of nails 1-5 bilaterally performed using a nail nipper. Filed with dremel without incident.  4. Medically necessary excisional debridement including subcutaneous tissue was performed using a tissue nipper and a chisel blade. Excisional debridement of all the necrotic nonviable tissue down to healthy bleeding viable tissue was performed with post-debridement measurements same as pre-. 5. The wound was cleansed and dry sterile dressing applied. 6. Continue using Silvadene cream daily as directed by Dr. March Rummage.  7. Return to clinic in 3 weeks with Dr. March Rummage.   Return to clinic in 3 mos.     Edrick Kins, DPM [facility]  Dr. Edrick Kins, [address]                                        [place], [place] [address]                Office [phone]  Fax [phone]

## 2019-08-10 DIAGNOSIS — Z6838 Body mass index (BMI) 38.0-38.9, adult: Secondary | ICD-10-CM | POA: Diagnosis not present

## 2019-08-10 DIAGNOSIS — M5416 Radiculopathy, lumbar region: Secondary | ICD-10-CM | POA: Diagnosis not present

## 2019-08-10 DIAGNOSIS — R05 Cough: Secondary | ICD-10-CM | POA: Diagnosis not present

## 2019-08-10 DIAGNOSIS — E1149 Type 2 diabetes mellitus with other diabetic neurological complication: Secondary | ICD-10-CM | POA: Diagnosis not present

## 2019-08-10 DIAGNOSIS — J449 Chronic obstructive pulmonary disease, unspecified: Secondary | ICD-10-CM | POA: Diagnosis not present

## 2019-08-10 DIAGNOSIS — E1165 Type 2 diabetes mellitus with hyperglycemia: Secondary | ICD-10-CM | POA: Diagnosis not present

## 2019-08-12 ENCOUNTER — Emergency Department (HOSPITAL_COMMUNITY): Payer: Medicare HMO

## 2019-08-12 ENCOUNTER — Inpatient Hospital Stay (HOSPITAL_COMMUNITY)
Admission: EM | Admit: 2019-08-12 | Discharge: 2019-08-16 | DRG: 291 | Disposition: A | Discharge status: Home / Self Care | Payer: Medicare HMO | Attending: Internal Medicine | Admitting: Internal Medicine

## 2019-08-12 ENCOUNTER — Encounter (HOSPITAL_COMMUNITY): Payer: Self-pay | Admitting: *Deleted

## 2019-08-12 ENCOUNTER — Other Ambulatory Visit: Payer: Self-pay

## 2019-08-12 DIAGNOSIS — Z20822 Contact with and (suspected) exposure to covid-19: Secondary | ICD-10-CM | POA: Diagnosis not present

## 2019-08-12 DIAGNOSIS — I10 Essential (primary) hypertension: Secondary | ICD-10-CM | POA: Diagnosis not present

## 2019-08-12 DIAGNOSIS — N1832 Chronic kidney disease, stage 3b: Secondary | ICD-10-CM | POA: Diagnosis present

## 2019-08-12 DIAGNOSIS — Z79899 Other long term (current) drug therapy: Secondary | ICD-10-CM | POA: Diagnosis not present

## 2019-08-12 DIAGNOSIS — N390 Urinary tract infection, site not specified: Secondary | ICD-10-CM | POA: Diagnosis not present

## 2019-08-12 DIAGNOSIS — I4891 Unspecified atrial fibrillation: Secondary | ICD-10-CM | POA: Diagnosis not present

## 2019-08-12 DIAGNOSIS — E890 Postprocedural hypothyroidism: Secondary | ICD-10-CM | POA: Diagnosis present

## 2019-08-12 DIAGNOSIS — Z950 Presence of cardiac pacemaker: Secondary | ICD-10-CM

## 2019-08-12 DIAGNOSIS — Z79890 Hormone replacement therapy (postmenopausal): Secondary | ICD-10-CM

## 2019-08-12 DIAGNOSIS — T500X5A Adverse effect of mineralocorticoids and their antagonists, initial encounter: Secondary | ICD-10-CM | POA: Diagnosis present

## 2019-08-12 DIAGNOSIS — R0600 Dyspnea, unspecified: Secondary | ICD-10-CM | POA: Diagnosis not present

## 2019-08-12 DIAGNOSIS — E875 Hyperkalemia: Secondary | ICD-10-CM | POA: Diagnosis present

## 2019-08-12 DIAGNOSIS — Z794 Long term (current) use of insulin: Secondary | ICD-10-CM

## 2019-08-12 DIAGNOSIS — K219 Gastro-esophageal reflux disease without esophagitis: Secondary | ICD-10-CM | POA: Diagnosis present

## 2019-08-12 DIAGNOSIS — Z801 Family history of malignant neoplasm of trachea, bronchus and lung: Secondary | ICD-10-CM | POA: Diagnosis not present

## 2019-08-12 DIAGNOSIS — R0609 Other forms of dyspnea: Secondary | ICD-10-CM

## 2019-08-12 DIAGNOSIS — Z7901 Long term (current) use of anticoagulants: Secondary | ICD-10-CM | POA: Diagnosis not present

## 2019-08-12 DIAGNOSIS — T465X5A Adverse effect of other antihypertensive drugs, initial encounter: Secondary | ICD-10-CM | POA: Diagnosis present

## 2019-08-12 DIAGNOSIS — E1122 Type 2 diabetes mellitus with diabetic chronic kidney disease: Secondary | ICD-10-CM | POA: Diagnosis present

## 2019-08-12 DIAGNOSIS — I110 Hypertensive heart disease with heart failure: Secondary | ICD-10-CM | POA: Diagnosis not present

## 2019-08-12 DIAGNOSIS — Z7951 Long term (current) use of inhaled steroids: Secondary | ICD-10-CM

## 2019-08-12 DIAGNOSIS — Z808 Family history of malignant neoplasm of other organs or systems: Secondary | ICD-10-CM | POA: Diagnosis not present

## 2019-08-12 DIAGNOSIS — J9601 Acute respiratory failure with hypoxia: Secondary | ICD-10-CM

## 2019-08-12 DIAGNOSIS — R0602 Shortness of breath: Secondary | ICD-10-CM | POA: Diagnosis not present

## 2019-08-12 DIAGNOSIS — I509 Heart failure, unspecified: Secondary | ICD-10-CM | POA: Diagnosis not present

## 2019-08-12 DIAGNOSIS — I5033 Acute on chronic diastolic (congestive) heart failure: Secondary | ICD-10-CM | POA: Diagnosis not present

## 2019-08-12 DIAGNOSIS — G4733 Obstructive sleep apnea (adult) (pediatric): Secondary | ICD-10-CM | POA: Diagnosis not present

## 2019-08-12 DIAGNOSIS — J441 Chronic obstructive pulmonary disease with (acute) exacerbation: Secondary | ICD-10-CM | POA: Diagnosis not present

## 2019-08-12 DIAGNOSIS — I2510 Atherosclerotic heart disease of native coronary artery without angina pectoris: Secondary | ICD-10-CM | POA: Diagnosis present

## 2019-08-12 DIAGNOSIS — R000 Tachycardia, unspecified: Secondary | ICD-10-CM | POA: Diagnosis not present

## 2019-08-12 DIAGNOSIS — J9600 Acute respiratory failure, unspecified whether with hypoxia or hypercapnia: Secondary | ICD-10-CM

## 2019-08-12 DIAGNOSIS — E785 Hyperlipidemia, unspecified: Secondary | ICD-10-CM | POA: Diagnosis present

## 2019-08-12 DIAGNOSIS — Z833 Family history of diabetes mellitus: Secondary | ICD-10-CM

## 2019-08-12 DIAGNOSIS — R0689 Other abnormalities of breathing: Secondary | ICD-10-CM | POA: Diagnosis not present

## 2019-08-12 DIAGNOSIS — Z96652 Presence of left artificial knee joint: Secondary | ICD-10-CM | POA: Diagnosis present

## 2019-08-12 DIAGNOSIS — E1165 Type 2 diabetes mellitus with hyperglycemia: Secondary | ICD-10-CM | POA: Diagnosis present

## 2019-08-12 DIAGNOSIS — Z8249 Family history of ischemic heart disease and other diseases of the circulatory system: Secondary | ICD-10-CM | POA: Diagnosis not present

## 2019-08-12 DIAGNOSIS — I5031 Acute diastolic (congestive) heart failure: Secondary | ICD-10-CM | POA: Diagnosis not present

## 2019-08-12 DIAGNOSIS — I480 Paroxysmal atrial fibrillation: Secondary | ICD-10-CM | POA: Diagnosis present

## 2019-08-12 DIAGNOSIS — R079 Chest pain, unspecified: Secondary | ICD-10-CM | POA: Diagnosis not present

## 2019-08-12 DIAGNOSIS — I130 Hypertensive heart and chronic kidney disease with heart failure and stage 1 through stage 4 chronic kidney disease, or unspecified chronic kidney disease: Secondary | ICD-10-CM | POA: Diagnosis not present

## 2019-08-12 DIAGNOSIS — Z87891 Personal history of nicotine dependence: Secondary | ICD-10-CM | POA: Diagnosis not present

## 2019-08-12 DIAGNOSIS — Z6836 Body mass index (BMI) 36.0-36.9, adult: Secondary | ICD-10-CM

## 2019-08-12 DIAGNOSIS — M109 Gout, unspecified: Secondary | ICD-10-CM | POA: Diagnosis present

## 2019-08-12 DIAGNOSIS — D72829 Elevated white blood cell count, unspecified: Secondary | ICD-10-CM

## 2019-08-12 DIAGNOSIS — I5032 Chronic diastolic (congestive) heart failure: Secondary | ICD-10-CM

## 2019-08-12 DIAGNOSIS — Z888 Allergy status to other drugs, medicaments and biological substances status: Secondary | ICD-10-CM

## 2019-08-12 LAB — CBC WITH DIFFERENTIAL/PLATELET
Abs Immature Granulocytes: 0.09 10*3/uL — ABNORMAL HIGH (ref 0.00–0.07)
Basophils Absolute: 0.1 10*3/uL (ref 0.0–0.1)
Basophils Relative: 0 %
Eosinophils Absolute: 0 10*3/uL (ref 0.0–0.5)
Eosinophils Relative: 0 %
HCT: 30.9 % — ABNORMAL LOW (ref 36.0–46.0)
Hemoglobin: 9.5 g/dL — ABNORMAL LOW (ref 12.0–15.0)
Immature Granulocytes: 1 %
Lymphocytes Relative: 8 %
Lymphs Abs: 1.3 10*3/uL (ref 0.7–4.0)
MCH: 28.8 pg (ref 26.0–34.0)
MCHC: 30.7 g/dL (ref 30.0–36.0)
MCV: 93.6 fL (ref 80.0–100.0)
Monocytes Absolute: 0.8 10*3/uL (ref 0.1–1.0)
Monocytes Relative: 4 %
Neutro Abs: 14.9 10*3/uL — ABNORMAL HIGH (ref 1.7–7.7)
Neutrophils Relative %: 87 %
Platelets: 431 10*3/uL — ABNORMAL HIGH (ref 150–400)
RBC: 3.3 MIL/uL — ABNORMAL LOW (ref 3.87–5.11)
RDW: 15.4 % (ref 11.5–15.5)
WBC: 17.2 10*3/uL — ABNORMAL HIGH (ref 4.0–10.5)
nRBC: 0 % (ref 0.0–0.2)

## 2019-08-12 NOTE — ED Triage Notes (Signed)
Pt from home for exertional SOB x 3 days. Pt reports L sided chest pain earlier today, took two home nitros with improvement of chest pain. Pt labored currently, on 4L oxygen currently. EMS gave additional nitro x2; initial bp 210/systolic, decreased bo to 160/80. Also has had nonproductive cough and BLE. Pacemaker to L chest. Pt is on Eliquis for afib and wears CPAP at night.

## 2019-08-12 NOTE — ED Provider Notes (Signed)
[facility] EMERGENCY DEPARTMENT Provider Note   CSN: [id number] Arrival date & time: 08/12/19  2227     History Chief Complaint  Patient presents with  . Shortness of Breath    Kayla [name] is a 79 y.o. female with a hx of a-fib (on Eliquis), COPD, IDDM, OSA with night time CPAP, pacemaker, CAD, HTN presents to the Emergency Department complaining of gradual, persistent, progressively worsening shortness of breath onset approx 4 days ago.  Patient reports significant shortness of breath with exertion and some lightheadedness but no syncope.  She has not fallen or hit her head.  Patient reports that she normally only wears CPAP at night but has felt so short of breath she has been using it during the day as well.  She reports primarily staying in her chair or bed with her CPAP on.  Additionally she has been using her albuterol nebulizer without significant relief.  She reports normally only using this intermittently but has used it 4 times today.  She reports associated sharp, left-sided CP is not currently present.  She took nitroglycerin for this without any relief.  No specific aggravating or alleviating factors for her chest pain.  Movement and walking make her shortness of breath worse.  Nothing really seems to make her shortness of breath better.  She denies fever, chills, headache, neck pain, diaphoresis, abdominal pain, nausea, vomiting, diarrhea, syncope.  Cardiac Hx:  Low risk Myoview in 2013,  catheterization 2006 nonobstructive CAD  echocardiogram 2015 normal EF   48 hour Holter monitor 2015 PACs, PVCs, 3 beat PAT.   Event recorder - junctional rhythm high 30's and low 40's at times associated with dizziness. This prompted pacer.   Carotid Dopplers 40-59% left internal carotid, 39% right.  History of COPD, long-standing smoking. Dr. Halford Chessman. Chronic dyspnea on exertion no change.  The history is provided by the patient and medical records. No language  interpreter was used.       Past Medical History:  Diagnosis Date  . Allergy   . Anxiety   . Arthritis   . CAD (coronary artery disease)    Mild per remote cath in 2006, /    Nuclear, January, 2013, no significant abnormality,  . Carotid artery disease ([place])    Doppler, January, 2013, 0 26% RIC A., 71-24% LICA, stable  . Cataract   . Complication of anesthesia    wakes up "mean"  . COPD 02/16/2007   Intolerant of Spiriva, tudorza Intolerant of Trelegy   . COPD (chronic obstructive pulmonary disease) ([place])   . Coronary atherosclerosis 11/24/2008   Catheterization 2006, nonobstructive coronary disease   //   nuclear 2013, low risk    . Diabetes mellitus, type 2 ([place])   . Diverticula of colon   . DIVERTICULOSIS OF COLON 03/23/2007   Qualifier: Diagnosis of  By: Halford Chessman MD, Vineet    . Dizziness    occasional  . Dizzy spells 05/04/2011  . DM (diabetes mellitus) ([place]) 03/23/2007   Qualifier: Diagnosis of  By: Halford Chessman MD, Vineet    . Ejection fraction   . Essential hypertension 02/16/2007   Qualifier: Diagnosis of  By: Rosana Hoes CMA, Tammy    . G E R D 03/23/2007   Qualifier: Diagnosis of  By: Halford Chessman MD, Vineet    . Gall bladder disease 04/28/2016  . Gastritis and gastroduodenitis   . GERD (gastroesophageal reflux disease)   . Goiter    hx of  . Headache(784.0)  migraine  . Hyperlipidemia   . Hypertension   . Hypothyroidism   . HYPOTHYROIDISM, POSTSURGICAL 06/04/2008   Qualifier: Diagnosis of  By: Loanne Drilling MD, Jacelyn Pi   . Left ventricular hypertrophy   . Leg edema    occasional swelling  . Leg swelling 12/09/2018  . Myofascial pain syndrome, cervical 01/06/2018  . Obesity   . OBESITY 11/24/2008   Qualifier: Diagnosis of  By: Sidney Ace    . OBSTRUCTIVE SLEEP APNEA 02/16/2007   Auto CPAP 09/03/13 to 10/02/13 >> used on 30 of 30 nights with average 6 hrs and 3 min.  Average AHI is 3.1 with median CPAP 5 cm H2O and 95 th percentile CPAP 6 cm H2O.    . Occipital neuralgia of right side  01/06/2018  . OSA (obstructive sleep apnea)    cpap setting of 12  . Pain in joint of right hip 01/21/2018  . Palpitations 01/21/2011   48 hour Holter, 2015, scattered PACs and PVCs.   Marland Kitchen PONV (postoperative nausea and vomiting)    severe after every surgery  . Pulmonary hypertension, secondary   . RHINITIS 07/21/2010       . RUQ abdominal pain   . S/P left TKA 07/27/2011  . Sinus node dysfunction ([place]) 11/08/2015  . Sleep apnea   . Spinal stenosis of cervical region 11/07/2014  . Urinary frequency 12/09/2018  . VENTRICULAR HYPERTROPHY, LEFT 11/24/2008   Qualifier: Diagnosis of  By: Sidney Ace      Patient Active Problem List   Diagnosis Date Noted  . Leg swelling 12/09/2018  . Urinary frequency 12/09/2018  . Pain in joint of right hip 01/21/2018  . Myofascial pain syndrome, cervical 01/06/2018  . Occipital neuralgia of right side 01/06/2018  . RUQ abdominal pain   . Gastritis and gastroduodenitis   . Bile duct abnormality   . Cholecystitis 04/30/2016  . Gall bladder disease 04/28/2016  . Sinus node dysfunction ([place]) 11/08/2015  . Ejection fraction   . Spinal stenosis of cervical region 11/07/2014  . Preoperative clearance 10/09/2014  . Carotid artery disease ([place])   . S/P left TKA 07/27/2011  . Dizzy spells 05/04/2011  . Palpitations 01/21/2011  . RHINITIS 07/21/2010  . Hyperlipidemia 11/24/2008  . OBESITY 11/24/2008  . Coronary atherosclerosis 11/24/2008  . VENTRICULAR HYPERTROPHY, LEFT 11/24/2008  . HYPOTHYROIDISM, POSTSURGICAL 06/04/2008  . Headache 06/04/2008  . DM (diabetes mellitus) ([place]) 03/23/2007  . G E R D 03/23/2007  . DIVERTICULOSIS OF COLON 03/23/2007  . GENERALIZED OSTEOARTHROSIS UNSPECIFIED SITE 03/23/2007  . OBSTRUCTIVE SLEEP APNEA 02/16/2007  . Essential hypertension 02/16/2007  . COPD 02/16/2007    Past Surgical History:  Procedure Laterality Date  . ABDOMINAL HYSTERECTOMY     with appendectomy and colon polypectomy  . ANTERIOR CERVICAL  DECOMP/DISCECTOMY FUSION N/A 11/07/2014   Procedure: Anterior Cervical diskectomy and fusion Cervical four-five, Cervical five-six, Cervical six-seven;  Surgeon: Kary Kos, MD;  Location: [place] [facility];  Service: Neurosurgery;  Laterality: N/A;  . APPENDECTOMY    . ARTERY BIOPSY Right 02/16/2017   Procedure: BIOPSY TEMPORAL ARTERY;  Surgeon: Katha Cabal, MD;  Location: [facility] ORS;  Service: Vascular;  Laterality: Right;  . BACK SURGERY    . CARDIAC CATHETERIZATION    . CATARACT EXTRACTION W/ INTRAOCULAR LENS  IMPLANT, BILATERAL Bilateral   . CHOLECYSTECTOMY N/A 04/29/2016   Procedure: LAPAROSCOPIC CHOLECYSTECTOMY WITH INTRAOPERATIVE CHOLANGIOGRAM;  Surgeon: Alphonsa Overall, MD;  Location: [facility];  Service: General;  Laterality: N/A;  . EP IMPLANTABLE DEVICE  N/A 11/08/2015   Procedure: Pacemaker Implant;  Surgeon: Deboraha Sprang, MD;  Location: [place] [facility];  Service: Cardiovascular;  Laterality: N/A;  . INSERT / REPLACE / REMOVE PACEMAKER  2017   Dr. Jens Som [facility])  . JOINT REPLACEMENT    . LUMBAR DISC SURGERY  1990's   x 2  . THYROIDECTOMY  2011  . TOTAL KNEE ARTHROPLASTY  07/27/2011   Procedure: TOTAL KNEE ARTHROPLASTY;  Surgeon: Mauri Pole, MD;  Location: [facility];  Service: Orthopedics;  Laterality: Left;  . UPPER ESOPHAGEAL ENDOSCOPIC ULTRASOUND (EUS) N/A 05/14/2016   Procedure: UPPER ESOPHAGEAL ENDOSCOPIC ULTRASOUND (EUS);  Surgeon: Milus Banister, MD;  Location: [facility];  Service: Endoscopy;  Laterality: N/A;  radial only-will be done mod if needed     OB History   No obstetric history on file.     Family History  Problem Relation Age of Onset  . Heart Problems Mother   . Heart defect Mother        valve problems  . Heart Problems Father   . Heart disease Brother   . Lung cancer Daughter        non small cell   . Diabetes Maternal Aunt   . Diabetes Maternal Uncle   . Throat cancer Brother   . Colon cancer Neg Hx   . Colon polyps Neg Hx   . Esophageal  cancer Neg Hx   . Rectal cancer Neg Hx   . Stomach cancer Neg Hx     Social History   Tobacco Use  . Smoking status: Former Smoker    Packs/day: 2.00    Years: 33.00    Pack years: 66.00    Types: Cigarettes    Start date: 1966    Quit date: 05/04/1997    Years since quitting: 22.2  . Smokeless tobacco: Never Used  Substance Use Topics  . Alcohol use: No  . Drug use: No    Home Medications Prior to Admission medications   Medication Sig Start Date End Date Taking? Authorizing Provider  albuterol (PROVENTIL) (2.5 MG/3ML) 0.083% nebulizer solution Take 2.5 mg by nebulization every 6 (six) hours as needed for wheezing or shortness of breath.   Yes [provider]  albuterol (VENTOLIN HFA) 108 (90 Base) MCG/ACT inhaler Inhale 2 puffs into the lungs every 6 (six) hours as needed for wheezing or shortness of breath. 08/09/17  Yes Chesley Mires, MD  allopurinol (ZYLOPRIM) 100 MG tablet Take 100 mg by mouth 2 (two) times daily.  04/16/19  Yes [provider]  amLODipine (NORVASC) 5 MG tablet Take 1 tablet (5 mg total) by mouth daily. 04/13/19  Yes Jerline Pain, MD  apixaban (ELIQUIS) 5 MG TABS tablet Take 1 tablet (5 mg total) by mouth 2 (two) times daily. 11/14/18  Yes Fenton, Clint R, PA  atorvastatin (LIPITOR) 40 MG tablet Take 1 tablet (40 mg total) by mouth daily. Patient taking differently: Take 40 mg by mouth at bedtime.  12/20/18 08/13/19 Yes Tillery, Satira Mccallum, PA-C  budesonide-formoterol [facility]) 160-4.5 MCG/ACT inhaler Inhale 2 puffs into the lungs 2 (two) times daily for 1 day. 12/15/18 08/13/19 Yes Martyn Ehrich, NP  Cholecalciferol (VITAMIN D-3 PO) Take 1 capsule by mouth daily with breakfast.   Yes [provider]  metoprolol tartrate (LOPRESSOR) 50 MG tablet Take 50 mg by mouth in the morning and at bedtime.   Yes [provider]  nitroGLYCERIN (NITROSTAT) 0.4 MG SL tablet Place 1 tablet (  0.4 mg total) under the tongue every 5 (five)  minutes as needed for chest pain. 04/13/19  Yes Jerline Pain, MD  predniSONE (DELTASONE) 10 MG tablet Take 10 mg by mouth See admin instructions. 08/11/19 08/23/19 Yes [provider]  spironolactone (ALDACTONE) 25 MG tablet Take 25 mg by mouth daily.  04/16/19  Yes [provider]  umeclidinium bromide (INCRUSE ELLIPTA) 62.5 MCG/INH AEPB Inhale 1 puff into the lungs daily. 01/25/19  Yes Chesley Mires, MD  vitamin B-12 (CYANOCOBALAMIN) 1000 MCG tablet Take 1,000 mcg by mouth daily.   Yes [provider]  acetaminophen (TYLENOL) 500 MG tablet Take 1,000 mg by mouth every 4 (four) hours as needed for mild pain or moderate pain.     [provider]  fluticasone (FLONASE) 50 MCG/ACT nasal spray USE 2 SPRAYS NASALLY DAILY Patient taking differently: Place 2 sprays into both nostrils daily.  02/26/17   Collene Gobble, MD  furosemide (LASIX) 40 MG tablet TAKE 1 TABLET (40 MG TOTAL) BY MOUTH DAILY. 11/29/18 04/13/19  Deboraha Sprang, MD  gabapentin (NEURONTIN) 800 MG tablet Take 800 mg by mouth 3 (three) times daily.  09/20/17   [provider]  HYDROcodone-acetaminophen (NORCO/VICODIN) 5-325 MG tablet Take 1-2 tablets by mouth every 6 (six) hours as needed for moderate pain. Patient taking differently: Take 1-2 tablets by mouth every 6 (six) hours as needed for moderate pain. Takes for back pain 04/30/16   Meuth, Brooke A, PA-C  insulin aspart (NOVOLOG FLEXPEN) 100 UNIT/ML FlexPen Inject 6-10 Units into the skin 3 (three) times daily as needed for high blood sugar (CBG >130).    [provider]  insulin glargine (LANTUS) 100 UNIT/ML injection Inject 100 Units into the skin 2 (two) times daily.     [provider]  levothyroxine (SYNTHROID) 175 MCG tablet  02/24/19   [provider]  LORazepam (ATIVAN) 2 MG tablet Take 2 mg by mouth at bedtime as needed for anxiety (depression).  08/20/14   [provider]  losartan (COZAAR) 100 MG  tablet Take 100 mg by mouth daily.  06/21/17   [provider]  metoprolol tartrate (LOPRESSOR) 25 MG tablet Take 50 mg in the morning and 25 mg at night    [provider]  Sedan into the lungs at bedtime. CPAP    [provider]  promethazine (PHENADOZ) 25 MG suppository Place 25 mg rectally daily as needed (headache).    [provider]  silver sulfADIAZINE (SILVADENE) 1 % cream Apply pea-sized amount to wound daily. Patient not taking: Reported on 08/13/2019 07/05/19   Evelina Bucy, DPM  SUMAtriptan (IMITREX) 25 MG tablet Take 25 mg by mouth See admin instructions. Take one tablet (25 mg) by mouth at onset of migraine headache. May repeat in 2 hours if needed. 10/27/17   [provider]  TRUE METRIX BLOOD GLUCOSE TEST test strip  09/18/17   [provider]    Allergies    Trulicity [dulaglutide]  Review of Systems   Review of Systems  Constitutional: Negative for appetite change, diaphoresis, fatigue, fever and unexpected weight change.  HENT: Negative for mouth sores.   Eyes: Negative for visual disturbance.  Respiratory: Positive for shortness of breath. Negative for cough, chest tightness and wheezing.   Cardiovascular: Positive for chest pain and leg swelling.  Gastrointestinal: Negative for abdominal pain, constipation, diarrhea, nausea and vomiting.  Endocrine: Negative for polydipsia, polyphagia and polyuria.  Genitourinary: Negative for dysuria,  frequency, hematuria and urgency.  Musculoskeletal: Negative for back pain and neck stiffness.  Skin: Negative for rash.  Allergic/Immunologic: Negative for immunocompromised state.  Neurological: Positive for light-headedness. Negative for syncope and headaches.  Hematological: Does not bruise/bleed easily.  Psychiatric/Behavioral: Negative for sleep disturbance. The patient is not nervous/anxious.     Physical Exam Updated Vital Signs BP (!) 159/77 (BP  Location: Right Arm)   Pulse 70   Temp 97.9 F (36.6 C) (Oral)   Resp 17   Ht 5\' 6"  (1.676 m)   Wt 104.3 kg   SpO2 99%   BMI 37.12 kg/m   Physical Exam Vitals and nursing note reviewed.  Constitutional:      General: She is in acute distress.     Appearance: She is not diaphoretic.     Comments: Increased work of breathing  HENT:     Head: Normocephalic.  Eyes:     General: No scleral icterus.    Conjunctiva/sclera: Conjunctivae normal.  Cardiovascular:     Rate and Rhythm: Tachycardia present. Rhythm irregularly irregular.     Pulses: Normal pulses.          Radial pulses are 2+ on the right side and 2+ on the left side.     Comments: Mild tachycardia.  A. fib on the monitor. Pulmonary:     Effort: Tachypnea, accessory muscle usage, prolonged expiration and respiratory distress present. No retractions.     Breath sounds: No stridor. Examination of the right-lower field reveals wheezing. Decreased breath sounds ( throughout) and wheezing (faint wheeze in RLL) present.     Comments: Equal chest rise. Moderate increased work of breathing. 4L via [place] for work of breathing; no hypoxia Abdominal:     General: There is no distension.     Palpations: Abdomen is soft.     Tenderness: There is no abdominal tenderness. There is no guarding or rebound.  Musculoskeletal:     Cervical back: Normal range of motion.     Right lower leg: Tenderness present. Edema present.     Left lower leg: Tenderness present. Edema present.     Comments: Moves all extremities equally and without difficulty.  Skin:    General: Skin is warm and dry.     Capillary Refill: Capillary refill takes less than 2 seconds.  Neurological:     Mental Status: She is alert.     GCS: GCS eye subscore is 4. GCS verbal subscore is 5. GCS motor subscore is 6.     Comments: Speech is clear and goal oriented.  Psychiatric:        Mood and Affect: Mood normal.     ED Results / Procedures / Treatments   Labs (all  labs ordered are listed, but only abnormal results are displayed) Labs Reviewed  CBC WITH DIFFERENTIAL/PLATELET - Abnormal; Notable for the following components:      Result Value   WBC 17.2 (*)    RBC 3.30 (*)    Hemoglobin 9.5 (*)    HCT 30.9 (*)    Platelets 431 (*)    Neutro Abs 14.9 (*)    Abs Immature Granulocytes 0.09 (*)    All other components within normal limits  COMPREHENSIVE METABOLIC PANEL - Abnormal; Notable for the following components:   Potassium 5.3 (*)    Glucose, Bld 276 (*)    BUN 33 (*)    Creatinine, Ser 1.27 (*)    Albumin 3.1 (*)    GFR calc non Af  Amer 40 (*)    GFR calc Af Amer 47 (*)    All other components within normal limits  BRAIN NATRIURETIC PEPTIDE - Abnormal; Notable for the following components:   B Natriuretic Peptide 479.7 (*)    All other components within normal limits  URINALYSIS, ROUTINE W REFLEX MICROSCOPIC - Abnormal; Notable for the following components:   Glucose, UA 50 (*)    Protein, ur >=300 (*)    All other components within normal limits  SARS CORONAVIRUS 2 (TAT 6-24 HRS)  TROPONIN I (HIGH SENSITIVITY)  TROPONIN I (HIGH SENSITIVITY)    EKG EKG Interpretation  Date/Time:  Saturday August 12 2019 22:34:20 EDT Ventricular Rate:  93 PR Interval:    QRS Duration: 97 QT Interval:  404 QTC Calculation: 414 R Axis:   55 Text Interpretation: Atrial fibrillation Ventricular tachycardia, unsustained Anteroseptal infarct, old Nonspecific repol abnormality, diffuse leads now atrial fibrillation Confirmed by Ezequiel Essex [phone]) on 08/12/2019 11:00:05 PM   Radiology DG Chest Port 1 View  Result Date: 08/13/2019 CLINICAL DATA:  Shortness of breath. EXAM: PORTABLE CHEST 1 VIEW COMPARISON:  October 07, 2017 FINDINGS: There is a multi lead AICD. Mild atelectasis is seen within the bilateral lung bases, without evidence of acute infiltrate, pleural effusion or pneumothorax. The heart size and mediastinal contours are within normal  limits. A radiopaque fusion plate and screws are seen overlying the lower cervical spine. Multilevel degenerative changes seen throughout the thoracic spine. IMPRESSION: Mild bibasilar atelectasis without acute or active cardiopulmonary disease. Electronically Signed   By: Virgina Norfolk M.D.   On: 08/13/2019 01:07    Procedures Procedures (including critical care time)  Medications Ordered in ED Medications  furosemide (LASIX) injection 40 mg (40 mg Intravenous Given 08/13/19 0202)  predniSONE (DELTASONE) tablet 20 mg (20 mg Oral Given 08/13/19 0303)    ED Course  I have reviewed the triage vital signs and the nursing notes.  Pertinent labs & imaging results that were available during my care of the patient were reviewed by me and considered in my medical decision making (see chart for details).  Clinical Course as of Aug 12 313  Sun Aug 13, 2019  0007 Down from 11.2 g/dL 9 months ago  Hemoglobin(!): 9.5 [HM]  0008 Hx of same  BP(!): 159/77 [HM]  0125 elevated  B Natriuretic Peptide(!): 479.7 [HM]  0129 baseline  Creatinine(!): 1.27 [HM]  0129 Elevated   Potassium(!): 5.3 [HM]    Clinical Course User Index [HM] Senie Lanese, Gwenlyn Perking   MDM Rules/Calculators/A&P                      Patient presents to the emergency department with shortness of breath and dyspnea on exertion.  She is a history of diastolic heart failure but has never had a CHF exacerbation.  Last echocardiogram showed normal EF however this was done in 2015.   Patient today with increased work of breathing, peripheral edema.  She is requiring supplemental oxygen to improve her breathing.  EKG with A. fib.  Patient has a history of same and is on Eliquis.  Chest x-ray not read as vascular congestion however personally evaluated these images and do believe there is some element of vascular congestion.  Patient given Lasix here in the emergency department.  Patient's creatinine is baseline however she does have  a slightly elevated potassium.  She will need to be admitted for CHF exacerbation, new oxygen requirement and diuresis.  The patient was  discussed with and seen by Dr. Wyvonnia Dusky who agrees with the treatment plan.  Discussed patient's case with hospitalist, Dr. Marlowe Sax.  I have recommended admission and patient (and family if present) agree with this plan. Admitting physician will place admission orders.     Final Clinical Impression(s) / ED Diagnoses Final diagnoses:  Dyspnea on exertion  Acute congestive heart failure, unspecified heart failure type [facility])    Rx / DC Orders ED Discharge Orders    None       Shunta Mclaurin, Gwenlyn Perking 08/13/19 0315    Ezequiel Essex, MD 08/13/19 0900

## 2019-08-13 ENCOUNTER — Emergency Department (HOSPITAL_COMMUNITY): Payer: Medicare HMO

## 2019-08-13 ENCOUNTER — Inpatient Hospital Stay (HOSPITAL_COMMUNITY): Payer: Medicare HMO

## 2019-08-13 ENCOUNTER — Other Ambulatory Visit (HOSPITAL_COMMUNITY): Payer: Medicare HMO

## 2019-08-13 DIAGNOSIS — I5031 Acute diastolic (congestive) heart failure: Secondary | ICD-10-CM

## 2019-08-13 DIAGNOSIS — I5033 Acute on chronic diastolic (congestive) heart failure: Secondary | ICD-10-CM

## 2019-08-13 DIAGNOSIS — I4891 Unspecified atrial fibrillation: Secondary | ICD-10-CM | POA: Diagnosis present

## 2019-08-13 DIAGNOSIS — D72829 Elevated white blood cell count, unspecified: Secondary | ICD-10-CM

## 2019-08-13 DIAGNOSIS — E785 Hyperlipidemia, unspecified: Secondary | ICD-10-CM | POA: Diagnosis present

## 2019-08-13 DIAGNOSIS — J441 Chronic obstructive pulmonary disease with (acute) exacerbation: Secondary | ICD-10-CM

## 2019-08-13 DIAGNOSIS — K219 Gastro-esophageal reflux disease without esophagitis: Secondary | ICD-10-CM | POA: Diagnosis present

## 2019-08-13 DIAGNOSIS — J9601 Acute respiratory failure with hypoxia: Secondary | ICD-10-CM | POA: Diagnosis not present

## 2019-08-13 DIAGNOSIS — R0602 Shortness of breath: Secondary | ICD-10-CM | POA: Diagnosis present

## 2019-08-13 DIAGNOSIS — E890 Postprocedural hypothyroidism: Secondary | ICD-10-CM | POA: Diagnosis present

## 2019-08-13 DIAGNOSIS — Z79890 Hormone replacement therapy (postmenopausal): Secondary | ICD-10-CM | POA: Diagnosis not present

## 2019-08-13 DIAGNOSIS — Z79899 Other long term (current) drug therapy: Secondary | ICD-10-CM | POA: Diagnosis not present

## 2019-08-13 DIAGNOSIS — G4733 Obstructive sleep apnea (adult) (pediatric): Secondary | ICD-10-CM | POA: Diagnosis present

## 2019-08-13 DIAGNOSIS — Z20822 Contact with and (suspected) exposure to covid-19: Secondary | ICD-10-CM | POA: Diagnosis present

## 2019-08-13 DIAGNOSIS — I2510 Atherosclerotic heart disease of native coronary artery without angina pectoris: Secondary | ICD-10-CM | POA: Diagnosis present

## 2019-08-13 DIAGNOSIS — Z96652 Presence of left artificial knee joint: Secondary | ICD-10-CM | POA: Diagnosis present

## 2019-08-13 DIAGNOSIS — I509 Heart failure, unspecified: Secondary | ICD-10-CM

## 2019-08-13 DIAGNOSIS — N390 Urinary tract infection, site not specified: Secondary | ICD-10-CM | POA: Diagnosis present

## 2019-08-13 DIAGNOSIS — I5032 Chronic diastolic (congestive) heart failure: Secondary | ICD-10-CM

## 2019-08-13 DIAGNOSIS — J9600 Acute respiratory failure, unspecified whether with hypoxia or hypercapnia: Secondary | ICD-10-CM

## 2019-08-13 DIAGNOSIS — I130 Hypertensive heart and chronic kidney disease with heart failure and stage 1 through stage 4 chronic kidney disease, or unspecified chronic kidney disease: Secondary | ICD-10-CM | POA: Diagnosis present

## 2019-08-13 DIAGNOSIS — Z87891 Personal history of nicotine dependence: Secondary | ICD-10-CM | POA: Diagnosis not present

## 2019-08-13 DIAGNOSIS — Z801 Family history of malignant neoplasm of trachea, bronchus and lung: Secondary | ICD-10-CM | POA: Diagnosis not present

## 2019-08-13 DIAGNOSIS — E1122 Type 2 diabetes mellitus with diabetic chronic kidney disease: Secondary | ICD-10-CM | POA: Diagnosis present

## 2019-08-13 DIAGNOSIS — N1832 Chronic kidney disease, stage 3b: Secondary | ICD-10-CM | POA: Diagnosis present

## 2019-08-13 DIAGNOSIS — R079 Chest pain, unspecified: Secondary | ICD-10-CM | POA: Diagnosis not present

## 2019-08-13 DIAGNOSIS — Z794 Long term (current) use of insulin: Secondary | ICD-10-CM | POA: Diagnosis not present

## 2019-08-13 DIAGNOSIS — Z7901 Long term (current) use of anticoagulants: Secondary | ICD-10-CM | POA: Diagnosis not present

## 2019-08-13 DIAGNOSIS — Z808 Family history of malignant neoplasm of other organs or systems: Secondary | ICD-10-CM | POA: Diagnosis not present

## 2019-08-13 DIAGNOSIS — Z7951 Long term (current) use of inhaled steroids: Secondary | ICD-10-CM | POA: Diagnosis not present

## 2019-08-13 DIAGNOSIS — Z8249 Family history of ischemic heart disease and other diseases of the circulatory system: Secondary | ICD-10-CM | POA: Diagnosis not present

## 2019-08-13 LAB — CBC
HCT: 34.2 % — ABNORMAL LOW (ref 36.0–46.0)
Hemoglobin: 10.2 g/dL — ABNORMAL LOW (ref 12.0–15.0)
MCH: 28.4 pg (ref 26.0–34.0)
MCHC: 29.8 g/dL — ABNORMAL LOW (ref 30.0–36.0)
MCV: 95.3 fL (ref 80.0–100.0)
Platelets: 463 10*3/uL — ABNORMAL HIGH (ref 150–400)
RBC: 3.59 MIL/uL — ABNORMAL LOW (ref 3.87–5.11)
RDW: 15.4 % (ref 11.5–15.5)
WBC: 16.3 10*3/uL — ABNORMAL HIGH (ref 4.0–10.5)
nRBC: 0 % (ref 0.0–0.2)

## 2019-08-13 LAB — URINALYSIS, ROUTINE W REFLEX MICROSCOPIC
Bacteria, UA: NONE SEEN
Bilirubin Urine: NEGATIVE
Glucose, UA: 50 mg/dL — AB
Hgb urine dipstick: NEGATIVE
Ketones, ur: NEGATIVE mg/dL
Leukocytes,Ua: NEGATIVE
Nitrite: NEGATIVE
Protein, ur: >=300 mg/dL — AB
Specific Gravity, Urine: 1.018 (ref 1.005–1.030)
pH: 5 (ref 5.0–8.0)

## 2019-08-13 LAB — VITAMIN B12: Vitamin B-12: 659 pg/mL (ref 180–914)

## 2019-08-13 LAB — COMPREHENSIVE METABOLIC PANEL
ALT: 17 U/L (ref 0–44)
AST: 20 U/L (ref 15–41)
Albumin: 3.1 g/dL — ABNORMAL LOW (ref 3.5–5.0)
Alkaline Phosphatase: 67 U/L (ref 38–126)
Anion gap: 11 (ref 5–15)
BUN: 33 mg/dL — ABNORMAL HIGH (ref 8–23)
CO2: 22 mmol/L (ref 22–32)
Calcium: 9.4 mg/dL (ref 8.9–10.3)
Chloride: 106 mmol/L (ref 98–111)
Creatinine, Ser: 1.27 mg/dL — ABNORMAL HIGH (ref 0.44–1.00)
GFR calc Af Amer: 47 mL/min — ABNORMAL LOW (ref >60)
GFR calc non Af Amer: 40 mL/min — ABNORMAL LOW (ref >60)
Glucose, Bld: 276 mg/dL — ABNORMAL HIGH (ref 70–99)
Potassium: 5.3 mmol/L — ABNORMAL HIGH (ref 3.5–5.1)
Sodium: 139 mmol/L (ref 135–145)
Total Bilirubin: 0.5 mg/dL (ref 0.3–1.2)
Total Protein: 7 g/dL (ref 6.5–8.1)

## 2019-08-13 LAB — PROCALCITONIN: Procalcitonin: <0.1 ng/mL

## 2019-08-13 LAB — FERRITIN: Ferritin: 28 ng/mL (ref 11–307)

## 2019-08-13 LAB — FOLATE: Folate: 18.2 ng/mL (ref >5.9)

## 2019-08-13 LAB — GLUCOSE, CAPILLARY
Glucose-Capillary: 234 mg/dL — ABNORMAL HIGH (ref 70–99)
Glucose-Capillary: 204 mg/dL — ABNORMAL HIGH (ref 70–99)
Glucose-Capillary: 228 mg/dL — ABNORMAL HIGH (ref 70–99)

## 2019-08-13 LAB — IRON AND TIBC
Iron: 24 ug/dL — ABNORMAL LOW (ref 28–170)
Saturation Ratios: 6 % — ABNORMAL LOW (ref 10.4–31.8)
TIBC: 392 ug/dL (ref 250–450)
UIBC: 368 ug/dL

## 2019-08-13 LAB — RETICULOCYTES
Immature Retic Fract: 31.9 % — ABNORMAL HIGH (ref 2.3–15.9)
RBC.: 3.54 MIL/uL — ABNORMAL LOW (ref 3.87–5.11)
Retic Count, Absolute: 91.7 10*3/uL (ref 19.0–186.0)
Retic Ct Pct: 2.6 % (ref 0.4–3.1)

## 2019-08-13 LAB — ECHOCARDIOGRAM COMPLETE
Height: 66 in
Weight: 3680 oz

## 2019-08-13 LAB — TROPONIN I (HIGH SENSITIVITY)
Troponin I (High Sensitivity): 12 ng/L (ref <18)
Troponin I (High Sensitivity): 12 ng/L (ref <18)

## 2019-08-13 LAB — CBG MONITORING, ED: Glucose-Capillary: 185 mg/dL — ABNORMAL HIGH (ref 70–99)

## 2019-08-13 LAB — SARS CORONAVIRUS 2 (TAT 6-24 HRS): SARS Coronavirus 2: NEGATIVE

## 2019-08-13 LAB — BRAIN NATRIURETIC PEPTIDE: B Natriuretic Peptide: 479.7 pg/mL — ABNORMAL HIGH (ref 0.0–100.0)

## 2019-08-13 LAB — D-DIMER, QUANTITATIVE: D-Dimer, Quant: 0.49 ug/mL-FEU (ref 0.00–0.50)

## 2019-08-13 MED ORDER — MOMETASONE FURO-FORMOTEROL FUM 200-5 MCG/ACT IN AERO
2.0000 | INHALATION_SPRAY | Freq: Two times a day (BID) | RESPIRATORY_TRACT | Status: DC
  Administered 2019-08-13 – 2019-08-16 (×6): 2 via RESPIRATORY_TRACT
  Filled 2019-08-13: qty 8.8

## 2019-08-13 MED ORDER — PREDNISONE 20 MG PO TABS
20.0000 mg | ORAL_TABLET | Freq: Once | ORAL | Status: AC
  Administered 2019-08-13: 20 mg via ORAL
  Filled 2019-08-13: qty 1

## 2019-08-13 MED ORDER — APIXABAN 5 MG PO TABS
5.0000 mg | ORAL_TABLET | Freq: Two times a day (BID) | ORAL | Status: DC
  Administered 2019-08-13 – 2019-08-16 (×7): 5 mg via ORAL
  Filled 2019-08-13 (×8): qty 1

## 2019-08-13 MED ORDER — FLUTICASONE PROPIONATE 50 MCG/ACT NA SUSP
2.0000 | Freq: Every day | NASAL | Status: DC
  Administered 2019-08-13 – 2019-08-15 (×3): 2 via NASAL
  Filled 2019-08-13: qty 16

## 2019-08-13 MED ORDER — ALBUTEROL SULFATE (2.5 MG/3ML) 0.083% IN NEBU
2.5000 mg | INHALATION_SOLUTION | RESPIRATORY_TRACT | Status: DC | PRN
  Administered 2019-08-13: 2.5 mg via RESPIRATORY_TRACT
  Filled 2019-08-13: qty 3

## 2019-08-13 MED ORDER — PREDNISONE 20 MG PO TABS
40.0000 mg | ORAL_TABLET | Freq: Every day | ORAL | Status: DC
  Administered 2019-08-13 – 2019-08-14 (×2): 40 mg via ORAL
  Filled 2019-08-13 (×2): qty 2

## 2019-08-13 MED ORDER — METOPROLOL TARTRATE 50 MG PO TABS
50.0000 mg | ORAL_TABLET | Freq: Two times a day (BID) | ORAL | Status: DC
  Administered 2019-08-13 – 2019-08-16 (×7): 50 mg via ORAL
  Filled 2019-08-13 (×4): qty 1
  Filled 2019-08-13: qty 2
  Filled 2019-08-13 (×3): qty 1

## 2019-08-13 MED ORDER — FUROSEMIDE 10 MG/ML IJ SOLN
40.0000 mg | Freq: Two times a day (BID) | INTRAMUSCULAR | Status: DC
  Administered 2019-08-13 – 2019-08-15 (×5): 40 mg via INTRAVENOUS
  Filled 2019-08-13 (×6): qty 4

## 2019-08-13 MED ORDER — ALLOPURINOL 100 MG PO TABS
100.0000 mg | ORAL_TABLET | Freq: Two times a day (BID) | ORAL | Status: DC
  Administered 2019-08-13 – 2019-08-16 (×7): 100 mg via ORAL
  Filled 2019-08-13 (×8): qty 1

## 2019-08-13 MED ORDER — INSULIN ASPART 100 UNIT/ML ~~LOC~~ SOLN
0.0000 [IU] | Freq: Every day | SUBCUTANEOUS | Status: DC
  Administered 2019-08-13: 2 [IU] via SUBCUTANEOUS
  Administered 2019-08-14: 4 [IU] via SUBCUTANEOUS

## 2019-08-13 MED ORDER — SODIUM CHLORIDE 0.9% FLUSH
3.0000 mL | Freq: Two times a day (BID) | INTRAVENOUS | Status: DC
  Administered 2019-08-13 – 2019-08-15 (×6): 3 mL via INTRAVENOUS

## 2019-08-13 MED ORDER — INSULIN ASPART 100 UNIT/ML ~~LOC~~ SOLN
0.0000 [IU] | Freq: Three times a day (TID) | SUBCUTANEOUS | Status: DC
  Administered 2019-08-14: 4 [IU] via SUBCUTANEOUS
  Administered 2019-08-14: 2 [IU] via SUBCUTANEOUS
  Administered 2019-08-15 (×3): 4 [IU] via SUBCUTANEOUS
  Administered 2019-08-16: 3 [IU] via SUBCUTANEOUS

## 2019-08-13 MED ORDER — SODIUM CHLORIDE 0.9 % IV SOLN
250.0000 mL | INTRAVENOUS | Status: DC | PRN
  Administered 2019-08-16: 250 mL via INTRAVENOUS

## 2019-08-13 MED ORDER — INSULIN GLARGINE 100 UNIT/ML ~~LOC~~ SOLN
50.0000 [IU] | Freq: Two times a day (BID) | SUBCUTANEOUS | Status: DC
  Administered 2019-08-13 – 2019-08-16 (×7): 50 [IU] via SUBCUTANEOUS
  Filled 2019-08-13 (×9): qty 0.5

## 2019-08-13 MED ORDER — LEVOTHYROXINE SODIUM 75 MCG PO TABS
175.0000 ug | ORAL_TABLET | Freq: Every day | ORAL | Status: DC
  Administered 2019-08-13: 175 ug via ORAL
  Filled 2019-08-13: qty 1

## 2019-08-13 MED ORDER — ATORVASTATIN CALCIUM 40 MG PO TABS
40.0000 mg | ORAL_TABLET | Freq: Every day | ORAL | Status: DC
  Administered 2019-08-13 – 2019-08-15 (×3): 40 mg via ORAL
  Filled 2019-08-13 (×3): qty 1

## 2019-08-13 MED ORDER — AMLODIPINE BESYLATE 5 MG PO TABS
5.0000 mg | ORAL_TABLET | Freq: Every day | ORAL | Status: DC
  Administered 2019-08-13 – 2019-08-14 (×2): 5 mg via ORAL
  Filled 2019-08-13 (×2): qty 1

## 2019-08-13 MED ORDER — VITAMIN B-12 1000 MCG PO TABS
1000.0000 ug | ORAL_TABLET | Freq: Every day | ORAL | Status: DC
  Administered 2019-08-13 – 2019-08-16 (×4): 1000 ug via ORAL
  Filled 2019-08-13 (×5): qty 1

## 2019-08-13 MED ORDER — INSULIN ASPART 100 UNIT/ML ~~LOC~~ SOLN
0.0000 [IU] | Freq: Three times a day (TID) | SUBCUTANEOUS | Status: DC
  Administered 2019-08-13: 7 [IU] via SUBCUTANEOUS
  Administered 2019-08-13: 4 [IU] via SUBCUTANEOUS
  Administered 2019-08-13: 7 [IU] via SUBCUTANEOUS

## 2019-08-13 MED ORDER — HYDROCODONE-ACETAMINOPHEN 5-325 MG PO TABS
1.0000 | ORAL_TABLET | Freq: Three times a day (TID) | ORAL | Status: DC | PRN
  Administered 2019-08-13 – 2019-08-16 (×9): 1 via ORAL
  Filled 2019-08-13 (×10): qty 1

## 2019-08-13 MED ORDER — LEVOTHYROXINE SODIUM 75 MCG PO TABS
175.0000 ug | ORAL_TABLET | Freq: Every day | ORAL | Status: DC
  Administered 2019-08-14 – 2019-08-16 (×3): 175 ug via ORAL
  Filled 2019-08-13 (×3): qty 1

## 2019-08-13 MED ORDER — GABAPENTIN 400 MG PO CAPS
800.0000 mg | ORAL_CAPSULE | Freq: Three times a day (TID) | ORAL | Status: DC
  Administered 2019-08-13 – 2019-08-16 (×10): 800 mg via ORAL
  Filled 2019-08-13 (×10): qty 2

## 2019-08-13 MED ORDER — METOPROLOL TARTRATE 5 MG/5ML IV SOLN
5.0000 mg | INTRAVENOUS | Status: AC
  Administered 2019-08-13: 5 mg via INTRAVENOUS
  Filled 2019-08-13: qty 5

## 2019-08-13 MED ORDER — UMECLIDINIUM BROMIDE 62.5 MCG/INH IN AEPB
1.0000 | INHALATION_SPRAY | Freq: Every day | RESPIRATORY_TRACT | Status: DC
  Administered 2019-08-14 – 2019-08-16 (×3): 1 via RESPIRATORY_TRACT
  Filled 2019-08-13: qty 7

## 2019-08-13 MED ORDER — FUROSEMIDE 10 MG/ML IJ SOLN
40.0000 mg | Freq: Once | INTRAMUSCULAR | Status: AC
  Administered 2019-08-13: 40 mg via INTRAVENOUS
  Filled 2019-08-13: qty 4

## 2019-08-13 MED ORDER — LORAZEPAM 1 MG PO TABS
1.0000 mg | ORAL_TABLET | Freq: Once | ORAL | Status: AC
  Administered 2019-08-13: 1 mg via ORAL
  Filled 2019-08-13: qty 1

## 2019-08-13 MED ORDER — ACETAMINOPHEN 325 MG PO TABS
650.0000 mg | ORAL_TABLET | ORAL | Status: DC | PRN
  Administered 2019-08-13 – 2019-08-16 (×4): 650 mg via ORAL
  Filled 2019-08-13 (×4): qty 2

## 2019-08-13 MED ORDER — SODIUM CHLORIDE 0.9% FLUSH: 3.0000 mL | INTRAVENOUS | Status: DC | PRN

## 2019-08-13 NOTE — Progress Notes (Signed)
Patient refused PPV tonight. 

## 2019-08-13 NOTE — Progress Notes (Signed)
Seen and agree with plan of care as per my partner who admitted this patient this morning  78 white female COPD likely Gold stage III-no oxygen CHF PPM [employer] dual, nonobstructive CAD on cath 2006 11/2015 2/2 SSS/A. fib on apixaban, BMI 37 OSA/CPAP, carotid disease mild 4/17  baseline clinic weight between 234-238 pounds  Relates SLB, DOE X 6 months more acutely worsening since 08/10/2019-over the past 6 months has been using BiPAP even during the day-can ambulate several steps and then has to stop because of weakness-did not relay this to anyone Also relays to me was started on apixaban about a month/month and a half prior based on event monitor showing junctional rhythm high 30s and low 40s I am seeing her in the ED and her heart rate is in the 130s when she went to pass stool it went up into the 170s  She has not received her daily metoprolol-she also tells me that over the past 2 to 3 days she has been more swollen in her lower extremities and this is resolved with her putting her legs up today She seems quite anxious at the bedside and when I take her off of her oxygen despite her D satting going to the RR, her sats remained above 95  Plan I agree with the rest of the plan as per Dr. Barnett Hatter will give Ativan p.o. x1, will give metoprolol 5 mg IV x1 now in addition to her usual scheduled metoprolol 50 twice daily and I have discussed this with his nurse who will reach back out to me if further issues Expected will be several days prior to be able to discharge her-with multimodal disease she should have a goals of care discussion with her primary care physician

## 2019-08-13 NOTE — ED Notes (Signed)
Attempted report 

## 2019-08-13 NOTE — ED Notes (Signed)
Patient placed on hospital bed for patients comfort. Purewick changed.

## 2019-08-13 NOTE — Progress Notes (Signed)
  Echocardiogram 2D Echocardiogram has been performed.  Krue Peterka A Callee Rohrig 08/13/2019, 3:11 PM

## 2019-08-13 NOTE — ED Notes (Signed)
Pt got up to move bedside commode, became tachycardic and sob; Dr. Verlon Au at bedside

## 2019-08-13 NOTE — Progress Notes (Signed)
ReDS Clip Diuretic Study Pt study # [id number]  Your patient is in the Blinded arm of the ReDS Clip Diuretic study.  Your patient has had a ReDS reading and the reading has been transmitted to the cloud.   Thank You   The research team   Vertis Kelch, PharmD, [facility] PGY2 Cardiology Pharmacy Resident Phone [phone] 08/13/2019       1:23 PM

## 2019-08-13 NOTE — H&P (Signed)
History and Physical    Kayla [name] OIN:[id number] DOB: [birth year] DOA: 08/12/2019  PCP: [name], MD Patient coming from: Home  Chief Complaint: Chest pain, shortness of breath  HPI: Kayla [name] is a 79 y.o. female with medical history significant of nonobstructive CAD per cath done in 2006, carotid artery disease, sinus node dysfunction status post PPM, A. fib on Eliquis, chronic diastolic CHF, CKD stage III, COPD, insulin-dependent diabetes, hypertension, hyperlipidemia, hypothyroidism, GERD, OSA on CPAP, anxiety presenting with complaints of chest pain and shortness of breath.  Chest pain relieved after patient took 2 sublingual nitroglycerin at home.  EMS gave additional nitro x2.  Initial blood pressure 709 systolic, improved to 628/36 after nitroglycerin.  Patient reports 2-day history of dyspnea on exertion.  Reports having a mild cough and wheezing.  Today she started having some chest discomfort so she took 2 sublingual nitroglycerins at home.  EMS gave her another 2 nitroglycerin which helped with the chest pain but her shortness of breath persisted.  She was told that her blood pressure was very high but reports compliance with her home medications.  Does report orthopnea and bilateral lower extremity edema.  States she takes Lasix 40 mg twice daily.  ED Course: Afebrile.  Blood pressure slightly elevated.  Requiring 4 L supplemental oxygen.  WBC count 17.2.  Hemoglobin 9.5, was above 11 on previous labs.  MCV 93.  Potassium 5.3.  Blood glucose 276.  Bicarb 22, anion gap 11.  Creatinine 1.2, at baseline.  BNP 479.  High-sensitivity troponin negative x2.  EKG without acute ischemic changes.  UA not suggestive of infection.  SARS-CoV-2 PCR test pending. Chest x-ray showing mild bibasilar atelectasis without acute or active cardiopulmonary disease. Patient received IV Lasix 40 mg and prednisone 20 mg.  Review of Systems:  All systems reviewed and apart from history of  presenting illness, are negative.  Past Medical History:  Diagnosis Date  . Allergy   . Anxiety   . Arthritis   . CAD (coronary artery disease)    Mild per remote cath in 2006, /    Nuclear, January, 2013, no significant abnormality,  . Carotid artery disease ([place])    Doppler, January, 2013, 0 62% RIC A., 94-76% LICA, stable  . Cataract   . Complication of anesthesia    wakes up "mean"  . COPD 02/16/2007   Intolerant of Spiriva, tudorza Intolerant of Trelegy   . COPD (chronic obstructive pulmonary disease) ([place])   . Coronary atherosclerosis 11/24/2008   Catheterization 2006, nonobstructive coronary disease   //   nuclear 2013, low risk    . Diabetes mellitus, type 2 ([place])   . Diverticula of colon   . DIVERTICULOSIS OF COLON 03/23/2007   Qualifier: Diagnosis of  By: Halford Chessman MD, Vineet    . Dizziness    occasional  . Dizzy spells 05/04/2011  . DM (diabetes mellitus) ([place]) 03/23/2007   Qualifier: Diagnosis of  By: Halford Chessman MD, Vineet    . Ejection fraction   . Essential hypertension 02/16/2007   Qualifier: Diagnosis of  By: Rosana Hoes CMA, Tammy    . G E R D 03/23/2007   Qualifier: Diagnosis of  By: Halford Chessman MD, Vineet    . Gall bladder disease 04/28/2016  . Gastritis and gastroduodenitis   . GERD (gastroesophageal reflux disease)   . Goiter    hx of  . Headache(784.0)    migraine  . Hyperlipidemia   . Hypertension   . Hypothyroidism   .  HYPOTHYROIDISM, POSTSURGICAL 06/04/2008   Qualifier: Diagnosis of  By: Loanne Drilling MD, Jacelyn Pi   . Left ventricular hypertrophy   . Leg edema    occasional swelling  . Leg swelling 12/09/2018  . Myofascial pain syndrome, cervical 01/06/2018  . Obesity   . OBESITY 11/24/2008   Qualifier: Diagnosis of  By: Sidney Ace    . OBSTRUCTIVE SLEEP APNEA 02/16/2007   Auto CPAP 09/03/13 to 10/02/13 >> used on 30 of 30 nights with average 6 hrs and 3 min.  Average AHI is 3.1 with median CPAP 5 cm H2O and 95 th percentile CPAP 6 cm H2O.    . Occipital neuralgia of right  side 01/06/2018  . OSA (obstructive sleep apnea)    cpap setting of 12  . Pain in joint of right hip 01/21/2018  . Palpitations 01/21/2011   48 hour Holter, 2015, scattered PACs and PVCs.   Marland Kitchen PONV (postoperative nausea and vomiting)    severe after every surgery  . Pulmonary hypertension, secondary   . RHINITIS 07/21/2010       . RUQ abdominal pain   . S/P left TKA 07/27/2011  . Sinus node dysfunction ([place]) 11/08/2015  . Sleep apnea   . Spinal stenosis of cervical region 11/07/2014  . Urinary frequency 12/09/2018  . VENTRICULAR HYPERTROPHY, LEFT 11/24/2008   Qualifier: Diagnosis of  By: Sidney Ace      Past Surgical History:  Procedure Laterality Date  . ABDOMINAL HYSTERECTOMY     with appendectomy and colon polypectomy  . ANTERIOR CERVICAL DECOMP/DISCECTOMY FUSION N/A 11/07/2014   Procedure: Anterior Cervical diskectomy and fusion Cervical four-five, Cervical five-six, Cervical six-seven;  Surgeon: Kary Kos, MD;  Location: [facility];  Service: Neurosurgery;  Laterality: N/A;  . APPENDECTOMY    . ARTERY BIOPSY Right 02/16/2017   Procedure: BIOPSY TEMPORAL ARTERY;  Surgeon: Katha Cabal, MD;  Location: [facility];  Service: Vascular;  Laterality: Right;  . BACK SURGERY    . CARDIAC CATHETERIZATION    . CATARACT EXTRACTION W/ INTRAOCULAR LENS  IMPLANT, BILATERAL Bilateral   . CHOLECYSTECTOMY N/A 04/29/2016   Procedure: LAPAROSCOPIC CHOLECYSTECTOMY WITH INTRAOPERATIVE CHOLANGIOGRAM;  Surgeon: Alphonsa Overall, MD;  Location: [facility];  Service: General;  Laterality: N/A;  . EP IMPLANTABLE DEVICE N/A 11/08/2015   Procedure: Pacemaker Implant;  Surgeon: Deboraha Sprang, MD;  Location: [facility];  Service: Cardiovascular;  Laterality: N/A;  . INSERT / REPLACE / REMOVE PACEMAKER  2017   Dr. Jens Som [facility])  . JOINT REPLACEMENT    . LUMBAR DISC SURGERY  1990's   x 2  . THYROIDECTOMY  2011  . TOTAL KNEE ARTHROPLASTY  07/27/2011   Procedure: TOTAL KNEE ARTHROPLASTY;  Surgeon: Mauri Pole, MD;  Location: [facility];  Service: Orthopedics;  Laterality: Left;  . UPPER ESOPHAGEAL ENDOSCOPIC ULTRASOUND (EUS) N/A 05/14/2016   Procedure: UPPER ESOPHAGEAL ENDOSCOPIC ULTRASOUND (EUS);  Surgeon: Milus Banister, MD;  Location: [facility];  Service: Endoscopy;  Laterality: N/A;  radial only-will be done mod if needed     reports that she quit smoking about 22 years ago. Her smoking use included cigarettes. She started smoking about 55 years ago. She has a 66.00 pack-year smoking history. She has never used smokeless tobacco. She reports that she does not drink alcohol or use drugs.  Allergies  Allergen Reactions  . Trulicity [Dulaglutide] Nausea Only and Other (See Comments)    Made the patient sick to her stomach (only) several days after  using it    Family History  Problem Relation Age of Onset  . Heart Problems Mother   . Heart defect Mother        valve problems  . Heart Problems Father   . Heart disease Brother   . Lung cancer Daughter        non small cell   . Diabetes Maternal Aunt   . Diabetes Maternal Uncle   . Throat cancer Brother   . Colon cancer Neg Hx   . Colon polyps Neg Hx   . Esophageal cancer Neg Hx   . Rectal cancer Neg Hx   . Stomach cancer Neg Hx     Prior to Admission medications   Medication Sig Start Date End Date Taking? Authorizing Provider  acetaminophen (TYLENOL) 500 MG tablet Take 1,000 mg by mouth every 4 (four) hours as needed for mild pain or moderate pain.     [provider]  albuterol (PROVENTIL) (2.5 MG/3ML) 0.083% nebulizer solution Take 2.5 mg by nebulization every 6 (six) hours as needed for wheezing or shortness of breath.    [provider]  albuterol (VENTOLIN HFA) 108 (90 Base) MCG/ACT inhaler Inhale 2 puffs into the lungs every 6 (six) hours as needed for wheezing or shortness of breath. 08/09/17   Chesley Mires, MD  allopurinol (ZYLOPRIM) 100 MG tablet  04/16/19   [provider]  amLODipine (NORVASC)  5 MG tablet Take 1 tablet (5 mg total) by mouth daily. 04/13/19   Jerline Pain, MD  apixaban (ELIQUIS) 5 MG TABS tablet Take 1 tablet (5 mg total) by mouth 2 (two) times daily. 11/14/18   Fenton, Clint R, PA  atorvastatin (LIPITOR) 40 MG tablet Take 1 tablet (40 mg total) by mouth daily. 12/20/18 04/13/19  Shirley Friar, PA-C  budesonide-formoterol (SYMBICORT) 160-4.5 MCG/ACT inhaler Inhale 2 puffs into the lungs 2 (two) times daily for 1 day. 12/15/18 04/13/19  Martyn Ehrich, NP  cholecalciferol (VITAMIN D) 1000 units tablet Take 1,000 Units by mouth daily.    [provider]  fluticasone (FLONASE) 50 MCG/ACT nasal spray USE 2 SPRAYS NASALLY DAILY 02/26/17   Collene Gobble, MD  furosemide (LASIX) 40 MG tablet TAKE 1 TABLET (40 MG TOTAL) BY MOUTH DAILY. 11/29/18 04/13/19  Deboraha Sprang, MD  gabapentin (NEURONTIN) 800 MG tablet Take 800 mg by mouth 3 (three) times daily.  09/20/17   [provider]  HYDROcodone-acetaminophen (NORCO/VICODIN) 5-325 MG tablet Take 1-2 tablets by mouth every 6 (six) hours as needed for moderate pain. Patient taking differently: Take 1-2 tablets by mouth every 6 (six) hours as needed for moderate pain. Takes for back pain 04/30/16   Meuth, Brooke A, PA-C  insulin aspart (NOVOLOG FLEXPEN) 100 UNIT/ML FlexPen Inject 6-10 Units into the skin 3 (three) times daily as needed for high blood sugar (CBG >130).    [provider]  insulin glargine (LANTUS) 100 UNIT/ML injection Inject 100 Units into the skin 2 (two) times daily.     [provider]  levothyroxine (SYNTHROID) 175 MCG tablet  02/24/19   [provider]  LORazepam (ATIVAN) 2 MG tablet Take 2 mg by mouth at bedtime as needed for anxiety (depression).  08/20/14   [provider]  losartan (COZAAR) 100 MG tablet Take 100 mg by mouth daily.  06/21/17   [provider]  metoprolol tartrate (LOPRESSOR) 25 MG tablet Take 50 mg in the morning and 25 mg  at night  [provider]  nitroGLYCERIN (NITROSTAT) 0.4 MG SL tablet Place 1 tablet (0.4 mg total) under the tongue every 5 (five) minutes as needed for chest pain. 04/13/19   Jerline Pain, MD  Phenylephrine-Chlorphen-DM (TUSS-DM PO) Take by mouth.    [provider]  PRESCRIPTION MEDICATION Inhale into the lungs at bedtime. CPAP    [provider]  promethazine (PHENADOZ) 25 MG suppository Place 25 mg rectally daily as needed (headache).    [provider]  silver sulfADIAZINE (SILVADENE) 1 % cream Apply pea-sized amount to wound daily. 07/05/19   Evelina Bucy, DPM  spironolactone (ALDACTONE) 25 MG tablet  04/16/19   [provider]  SUMAtriptan (IMITREX) 25 MG tablet Take 25 mg by mouth See admin instructions. Take one tablet (25 mg) by mouth at onset of migraine headache. May repeat in 2 hours if needed. 10/27/17   [provider]  TRUE METRIX BLOOD GLUCOSE TEST test strip  09/18/17   [provider]  umeclidinium bromide (INCRUSE ELLIPTA) 62.5 MCG/INH AEPB Inhale 1 puff into the lungs daily. 01/25/19   Chesley Mires, MD  vitamin B-12 (CYANOCOBALAMIN) 1000 MCG tablet Take 1,000 mcg by mouth daily.    [provider]    Physical Exam: Vitals:   08/12/19 2315 08/13/19 0200 08/13/19 0230 08/13/19 0300  BP: (!) 156/81 (!) 152/78 (!) 153/75 (!) 136/93  Pulse: 87 67  86  Resp: 16 (!) 25 (!) 27 (!) 27  Temp:      TempSrc:      SpO2: 98% 97%  100%  Weight:      Height:        Physical Exam  Constitutional: She is oriented to person, place, and time. She appears well-developed and well-nourished. No distress.  HENT:  Head: Normocephalic.  Eyes: Right eye exhibits no discharge. Left eye exhibits no discharge.  Neck: JVD present.  Cardiovascular: Normal rate, regular rhythm and intact distal pulses.  Pulmonary/Chest: Effort normal. No respiratory distress. She has no wheezes. She has rales.  Tachypneic Mild  bibasilar rales Scattered end expiratory wheezing  Abdominal: Soft. Bowel sounds are normal. She exhibits no distension. There is no abdominal tenderness. There is no guarding.  Musculoskeletal:     Cervical back: Neck supple.     Comments: +1 pedal edema  Neurological: She is alert and oriented to person, place, and time.  Skin: Skin is warm and dry. She is not diaphoretic.     Labs on Admission: I have personally reviewed following labs and imaging studies  CBC: Recent Labs  Lab 08/12/19 2333  WBC 17.2*  NEUTROABS 14.9*  HGB 9.5*  HCT 30.9*  MCV 93.6  PLT 937*   Basic Metabolic Panel: Recent Labs  Lab 08/12/19 2333  NA 139  K 5.3*  CL 106  CO2 22  GLUCOSE 276*  BUN 33*  CREATININE 1.27*  CALCIUM 9.4   GFR: Estimated Creatinine Clearance: 44.6 mL/min (A) (by C-G formula based on SCr of 1.27 mg/dL (H)). Liver Function Tests: Recent Labs  Lab 08/12/19 2333  AST 20  ALT 17  ALKPHOS 67  BILITOT 0.5  PROT 7.0  ALBUMIN 3.1*   No results for input(s): LIPASE, AMYLASE in the last 168 hours. No results for input(s): AMMONIA in the last 168 hours. Coagulation Profile: No results for input(s): INR, PROTIME in the last 168 hours. Cardiac Enzymes: No results for input(s): CKTOTAL, CKMB, CKMBINDEX, TROPONINI in the last 168 hours. BNP (last 3 results) Recent Labs  12/09/18 0935  PROBNP 121.0*   HbA1C: No results for input(s): HGBA1C in the last 72 hours. CBG: No results for input(s): GLUCAP in the last 168 hours. Lipid Profile: No results for input(s): CHOL, HDL, LDLCALC, TRIG, CHOLHDL, LDLDIRECT in the last 72 hours. Thyroid Function Tests: No results for input(s): TSH, T4TOTAL, FREET4, T3FREE, THYROIDAB in the last 72 hours. Anemia Panel: No results for input(s): VITAMINB12, FOLATE, FERRITIN, TIBC, IRON, RETICCTPCT in the last 72 hours. Urine analysis:    Component Value Date/Time   COLORURINE YELLOW 08/13/2019 0040   APPEARANCEUR CLEAR 08/13/2019  0040   LABSPEC 1.018 08/13/2019 0040   PHURINE 5.0 08/13/2019 0040   GLUCOSEU 50 (A) 08/13/2019 0040   GLUCOSEU NEGATIVE 12/09/2018 0944   HGBUR NEGATIVE 08/13/2019 0040   BILIRUBINUR NEGATIVE 08/13/2019 0040   KETONESUR NEGATIVE 08/13/2019 0040   PROTEINUR >=300 (A) 08/13/2019 0040   UROBILINOGEN 0.2 12/09/2018 0944   NITRITE NEGATIVE 08/13/2019 0040   LEUKOCYTESUR NEGATIVE 08/13/2019 0040    Radiological Exams on Admission: DG Chest Port 1 View  Result Date: 08/13/2019 CLINICAL DATA:  Shortness of breath. EXAM: PORTABLE CHEST 1 VIEW COMPARISON:  October 07, 2017 FINDINGS: There is a multi lead AICD. Mild atelectasis is seen within the bilateral lung bases, without evidence of acute infiltrate, pleural effusion or pneumothorax. The heart size and mediastinal contours are within normal limits. A radiopaque fusion plate and screws are seen overlying the lower cervical spine. Multilevel degenerative changes seen throughout the thoracic spine. IMPRESSION: Mild bibasilar atelectasis without acute or active cardiopulmonary disease. Electronically Signed   By: Virgina Norfolk M.D.   On: 08/13/2019 01:07    EKG: Independently reviewed.  A. fib, heart rate 93.  No acute ischemic changes.  Assessment/Plan Principal Problem:   Acute exacerbation of CHF (congestive heart failure) (HCC) Active Problems:   COPD with acute exacerbation (HCC)   Acute respiratory failure (HCC)   Chest pain   Leukocytosis   Acute on chronic diastolic CHF: Although chest x-ray not showing overt pulmonary edema, BNP elevated and does have signs of volume overload on exam.  Last echo done in 2015 with normal LV function.  Continue IV Lasix 40 mg twice daily, monitor intake and output, daily weights, and low-sodium diet with fluid restriction.  Continue home metoprolol.  Mild COPD exacerbation: Has some scattered wheezing on exam.  Start prednisone 40 mg daily.  Dulera, Incruse Ellipta, albuterol inhaler as  needed.  Acute respiratory failure: Related to problems listed above.  Oxygen saturation as low as 92% on room air, work of breathing improved after 4 L supplemental oxygen.  Continue supplemental oxygen, wean as tolerated.  Continuous pulse ox.  PE less likely as patient is on chronic anticoagulation with Eliquis and reports compliance.  Will check D-dimer level.  Chest pain: Likely related to acutely decompensated CHF.  ACS less likely as high-sensitivity troponin negative x2 and EKG without acute ischemic changes.  Patient has a history of nonobstructive CAD per cath done in 2006.  Per note from last cardiology visit from 04/13/2019, she had a stress test in 2013 which was low risk and with normal EF.  She had a significantly high coronary calcium score in 2019 but FFR analysis was reassuring, medical management was recommended.  Currently chest pain-free.  Plan is to order echocardiogram and continue cardiac monitoring.  Leukocytosis: Possibly reactive.  Chest x-ray not suggestive of pneumonia.  UA not suggestive of infection.  Check procalcitonin level and repeat CBC in a.m.  Normocytic anemia: Anemia panel and FOBT  Borderline hyperkalemia: Likely related to spironolactone and losartan use.  Hold these medications at this time.  Give Lasix as above and repeat BMP in a.m.  Hyperglycemia in the setting of insulin-dependent diabetes: Blood glucose 276 on initial labs.  No signs of DKA.  Check A1c.  Order sliding scale insulin ACHS.  Per pharmacy med rec, patient reports taking Lantus 130 units twice daily.  This appears to be a very high dose, please confirm with PCP in the morning. Will order Lantus 50 units twice daily at this time.  A. fib: Currently rate controlled.  Continue home Eliquis and metoprolol.  OSA: Continue CPAP at night  Hypertension: Continue home amlodipine, metoprolol  Hypothyroidism: Continue home Synthroid  DVT prophylaxis: Eliquis Code Status: Patient wishes to be  full code. Family Communication: Husband at bedside. Disposition Plan: Anticipate discharge after clinical improvement. Consults called: None Admission status: It is my clinical opinion that admission to INPATIENT is reasonable and necessary because of the expectation that this patient will require hospital care that crosses at least 2 midnights to treat this condition based on the medical complexity of the problems presented.  Given the aforementioned information, the predictability of an adverse outcome is felt to be significant.  The medical decision making on this patient was of high complexity and the patient is at high risk for clinical deterioration, therefore this is a level 3 visit.  Shela Leff MD [facility]  If 7PM-7AM, please contact night-coverage [email or web address]  08/13/2019, 4:31 AM

## 2019-08-13 NOTE — ED Notes (Signed)
bfast ordered 

## 2019-08-14 ENCOUNTER — Inpatient Hospital Stay (HOSPITAL_COMMUNITY): Payer: Medicare HMO

## 2019-08-14 LAB — CBC WITH DIFFERENTIAL/PLATELET
Abs Immature Granulocytes: 0.08 10*3/uL — ABNORMAL HIGH (ref 0.00–0.07)
Basophils Absolute: 0.1 10*3/uL (ref 0.0–0.1)
Basophils Relative: 1 %
Eosinophils Absolute: 0.2 10*3/uL (ref 0.0–0.5)
Eosinophils Relative: 1 %
HCT: 34.8 % — ABNORMAL LOW (ref 36.0–46.0)
Hemoglobin: 10.6 g/dL — ABNORMAL LOW (ref 12.0–15.0)
Immature Granulocytes: 1 %
Lymphocytes Relative: 22 %
Lymphs Abs: 3.4 10*3/uL (ref 0.7–4.0)
MCH: 28.1 pg (ref 26.0–34.0)
MCHC: 30.5 g/dL (ref 30.0–36.0)
MCV: 92.3 fL (ref 80.0–100.0)
Monocytes Absolute: 1.3 10*3/uL — ABNORMAL HIGH (ref 0.1–1.0)
Monocytes Relative: 8 %
Neutro Abs: 10.4 10*3/uL — ABNORMAL HIGH (ref 1.7–7.7)
Neutrophils Relative %: 67 %
Platelets: 498 10*3/uL — ABNORMAL HIGH (ref 150–400)
RBC: 3.77 MIL/uL — ABNORMAL LOW (ref 3.87–5.11)
RDW: 15.4 % (ref 11.5–15.5)
WBC: 15.4 10*3/uL — ABNORMAL HIGH (ref 4.0–10.5)
nRBC: 0 % (ref 0.0–0.2)

## 2019-08-14 LAB — HEPATIC FUNCTION PANEL
ALT: 16 U/L (ref 0–44)
AST: 21 U/L (ref 15–41)
Albumin: 3.1 g/dL — ABNORMAL LOW (ref 3.5–5.0)
Alkaline Phosphatase: 60 U/L (ref 38–126)
Bilirubin, Direct: <0.1 mg/dL (ref 0.0–0.2)
Total Bilirubin: 0.2 mg/dL — ABNORMAL LOW (ref 0.3–1.2)
Total Protein: 7.1 g/dL (ref 6.5–8.1)

## 2019-08-14 LAB — GLUCOSE, CAPILLARY
Glucose-Capillary: 73 mg/dL (ref 70–99)
Glucose-Capillary: 169 mg/dL — ABNORMAL HIGH (ref 70–99)
Glucose-Capillary: 202 mg/dL — ABNORMAL HIGH (ref 70–99)
Glucose-Capillary: 339 mg/dL — ABNORMAL HIGH (ref 70–99)

## 2019-08-14 LAB — HEMOGLOBIN A1C
Hgb A1c MFr Bld: 9 % — ABNORMAL HIGH (ref 4.8–5.6)
Mean Plasma Glucose: 212 mg/dL

## 2019-08-14 LAB — BASIC METABOLIC PANEL
Anion gap: 16 — ABNORMAL HIGH (ref 5–15)
BUN: 35 mg/dL — ABNORMAL HIGH (ref 8–23)
CO2: 23 mmol/L (ref 22–32)
Calcium: 9.4 mg/dL (ref 8.9–10.3)
Chloride: 101 mmol/L (ref 98–111)
Creatinine, Ser: 1.26 mg/dL — ABNORMAL HIGH (ref 0.44–1.00)
GFR calc Af Amer: 47 mL/min — ABNORMAL LOW (ref >60)
GFR calc non Af Amer: 41 mL/min — ABNORMAL LOW (ref >60)
Glucose, Bld: 85 mg/dL (ref 70–99)
Potassium: 4 mmol/L (ref 3.5–5.1)
Sodium: 140 mmol/L (ref 135–145)

## 2019-08-14 MED ORDER — SPIRONOLACTONE 12.5 MG HALF TABLET
12.5000 mg | ORAL_TABLET | Freq: Every day | ORAL | Status: DC
  Administered 2019-08-14 – 2019-08-16 (×3): 12.5 mg via ORAL
  Filled 2019-08-14 (×3): qty 1

## 2019-08-14 NOTE — Discharge Summary (Deleted)
Assesment/PLAN  mMRC 3-4 dyspneamultifactorial likely exacerbation primarily acute superimposed HFpEF COPD stage II-III -1.51 L, weight 105 kg-continue Continue Lasix 40 IV twice daily (home dose 40 p.o. twice daily) Metoprolol 50 twice daily for now-resume Aldactone cautiously 12.5--hold losartan, amlodipine-consider  resume prior to discharge Lipitor 40 daily Continue inhalers Flonase, Symbicort, Incruse--would not continue steroids or macrolide at this time feels more related to heart failure Leukocytosis Get CXR in a.m. ensure no aspiration or pneumonia-also on prednisone 40 which I will discontinue A. fib, PPM [employer]onic Rate controlled with some tachycardia-meds as above Nonobstructive CAD on cath 2006 Not on aspirin? Morbid obesity BMI 37, OSA on CPAP Did not use CPAP last night-patient encouraged to get a CPAP machine from home DM TY 2 with neuropathy Continue Lantus 50 twice daily, sliding scale coverage-also on steroids continue sliding scale coverage Neuropathy Continue B12, continue gabapentin 800 - 3 times daily and may need dose adjustment as outpatient Hypothyroid Continue Synthroid 175 every morning-TSH in 1 to 2 weeks History gout continue allopurinol 100 twice daily  DVT-on apixaban, Family discussed with husband present at bedside  Dispo: Needs negative fluid balance over next several days prior to transitioning back to home medications-not ready for discharge  S -78 white female COPD likely Gold stage III-no oxygen CHF PPM Medtronic dual, nonobstructive CAD on cath 2006 11/2015 2/2 SSS/A. fib on apixaban, BMI 37 OSA/CPAP, carotid disease mild 4/17  baseline clinic weight between 234-238 pounds  Relates SLB, DOE X 6 months more acutely worsening since 08/10/2019-over the past 6 months has been using BiPAP even during the day-can ambulate several steps and then has to stop because of shortness of breath despite taking all inhalers Also relays to me was started on  apixaban about a month/month and a half prior based on event monitor showing junctional rhythm high 30s and low 40s   TODAY Feels much better does not seem anxious eating drinking no shortness of breath and does not seem to be on oxygen ambulating around the room  O BP 139/75 (BP Location: Left Arm)   Pulse 78   Temp 98 F (36.7 C) (Oral)   Resp 18   Ht 5\' 6"  (1.676 m)   Wt 105 kg Comment: scale C  SpO2 96%   BMI 37.35 kg/m  @io @  Labs/studies reviewed today  BUN/creatinine 35/1.2 WBC down from 17-->15.4 hemoglobin 10.6

## 2019-08-14 NOTE — Plan of Care (Signed)

## 2019-08-14 NOTE — Progress Notes (Signed)
Assesment/PLAN  mMRC 3-4 dyspneamultifactorial likely exacerbation primarily acute superimposed HFpEF Kayla [name] stage II-III -1.51 L, weight 105 kg-continue Continue Lasix 40 IV twice daily (home dose 40 p.o. twice daily) Metoprolol 50 twice daily for now-resume Aldactone cautiously 12.5--hold losartan, amlodipine-consider  resume prior to discharge Lipitor 40 daily Continue inhalers Flonase, Symbicort, Incruse--would not continue steroids or macrolide at this time feels more related to heart failure Leukocytosis Get CXR in a.m. ensure no aspiration or pneumonia-also on prednisone 40 which I will discontinue A. fib, PPM [employer] Rate controlled with some tachycardia-meds as above Nonobstructive CAD on cath 2006 Not on aspirin? Morbid obesity BMI 37, OSA on CPAP Did not use CPAP last night-patient encouraged to get a CPAP machine from home DM TY 2 with neuropathy Continue Lantus 50 twice daily, sliding scale coverage-also on steroids continue sliding scale coverage Neuropathy Continue B12, continue gabapentin 800 - 3 times daily and may need dose adjustment as outpatient Hypothyroid Continue Synthroid 175 every morning-TSH in 1 to 2 weeks History gout continue allopurinol 100 twice daily  DVT-on apixaban, Family discussed with husband present at bedside  Dispo: Needs negative fluid balance over next several days prior to transitioning back to home medications-not ready for discharge  S -78 white female Kayla [name] likely Gold stage III-no oxygen Kayla [name] PPM Medtronic dual, nonobstructive CAD on cath 2006 11/2015 2/2 SSS/A. fib on apixaban, BMI 37 OSA/CPAP, carotid disease mild 4/17  baseline clinic weight between 234-238 pounds  Relates SLB, DOE X 6 months more acutely worsening since 08/10/2019-over the past 6 months has been using BiPAP even during the day-can ambulate several steps and then has to stop because of shortness of breath despite taking all inhalers Also relays to me was started on  apixaban about a month/month and a half prior based on event monitor showing junctional rhythm high 30s and low 40s   TODAY Feels much better does not seem anxious eating drinking no shortness of breath and does not seem to be on oxygen ambulating around the room  O BP 139/75 (BP Location: Left Arm)   Pulse 78   Temp 98 F (36.7 C) (Oral)   Resp 18   Ht 5\' 6"  (1.676 m)   Wt 105 kg Comment: scale C  SpO2 96%   BMI 37.35 kg/m  @io @  Labs/studies reviewed today  BUN/creatinine 35/1.2 WBC down from 17-->15.4 hemoglobin 10.6

## 2019-08-14 NOTE — Progress Notes (Signed)
Home PPV in use.

## 2019-08-14 NOTE — Evaluation (Signed)
Physical Therapy Evaluation Patient Details Name: Kayla [name] MRN: [id number] DOB: 11/14/1940 Today's Date: 08/14/2019   History of Present Illness  Pt is 79 yo female with medical history significant of nonobstructive CAD per cath done in 2006, CAD, sinus node dysfunction status post PPM, A. fib on Eliquis, chronic diastolic CHF, CKD stage III, COPD,DM, HTN, hyperlipidemia, hypothyroidism, GERD, OSA on CPAP, back pain with multiple surgeries, anxiety presenting with complaints of chest pain and shortness of breath.  Pt admitted with acute on chronic CHF and mild COPD exacerbation.  Clinical Impression  Pt admitted with above diagnosis. Pt admitted with CHF and COPD ; however, additionally has flair of chronic back pain.  She was able to ambulate 40' with min guard for safety but further ambulation limited due to back pain.  Her VSS throughout session.  Back pain was increased with extension and flexion, pt unable to tolerate further back assessment.  Discussed safe back mobility and applied hot pack. Pt currently with functional limitations due to the deficits listed below (see PT Problem List). Pt will benefit from skilled PT to increase their independence and safety with mobility to allow discharge to the venue listed below.       Follow Up Recommendations No PT follow up(cont f/u with PCP regarding back pain - may consider outpt PT for back pain)    Equipment Recommendations  None recommended by PT    Recommendations for Other Services       Precautions / Restrictions Precautions Precautions: Fall      Mobility  Bed Mobility Overal bed mobility: Needs Assistance             General bed mobility comments: in chair at arrival - discussed log roll technique for back pain control (pt familiar, multple back sx in past)  Transfers Overall transfer level: Needs assistance Equipment used: Rolling walker (2 wheeled) Transfers: Sit to/from Stand Sit to Stand: Min guard         General transfer comment: performed x 2; pt performed with increased time and caution due to back pain  Ambulation/Gait Ambulation/Gait assistance: Min guard Gait Distance (Feet): 40 Feet Assistive device: Rolling walker (2 wheeled) Gait Pattern/deviations: Decreased stride length     General Gait Details: slow and cautious gait but no LOB; limited due to back pain  Stairs            Wheelchair Mobility    Modified Rankin (Stroke Patients Only)       Balance Overall balance assessment: Needs assistance Sitting-balance support: No upper extremity supported;Feet supported Sitting balance-Leahy Scale: Normal     Standing balance support: No upper extremity supported;During functional activity Standing balance-Leahy Scale: Good                               Pertinent Vitals/Pain Pain Assessment: 0-10 Pain Score: 10-Worst pain ever Pain Location: low back Pain Descriptors / Indicators: Stabbing;Dull(stabbing with movement) Pain Intervention(s): Limited activity within patient's tolerance;Monitored during session;Premedicated before session    Home Living Family/patient expects to be discharged to:: Private residence Living Arrangements: Spouse/significant other Available Help at Discharge: Available 24 hours/day;Family Type of Home: House Home Access: Stairs to enter Entrance Stairs-Rails: Right Entrance Stairs-Number of Steps: 2 Home Layout: One level Home Equipment: Tub bench;Hand held shower head;Bedside commode;Walker - 2 wheels;Cane - single point      Prior Function Level of Independence: Needs assistance   Gait / Transfers Assistance  Needed: Most of the time uses cane but walker on bad days; ambulates limited community distances  ADL's / Homemaking Assistance Needed: Reports independent with ADLs and IADLs        Hand Dominance        Extremity/Trunk Assessment   Upper Extremity Assessment Upper Extremity Assessment: Overall  WFL for tasks assessed    Lower Extremity Assessment Lower Extremity Assessment: Overall WFL for tasks assessed(no MMT due to back pain - demonstrates at least 3/5)    Cervical / Trunk Assessment Cervical / Trunk Assessment: Normal  Communication   Communication: No difficulties  Cognition Arousal/Alertness: Awake/alert Behavior During Therapy: WFL for tasks assessed/performed Overall Cognitive Status: Within Functional Limits for tasks assessed                                        General Comments General comments (skin integrity, edema, etc.):  Pt with hx of back pain.  Reports has been flaired up for a few weeks and has been seeing her PCP.  Reports her PCP started her on Prednisone to see if it would improve pain, but if not may order MRI and determine POC from there.  Pt reports pain is currently 10/10- stated pain got aggravated with position for xray that was just completed (standing holding onto a bar and pulling up). States pain is effected by certain movements but she is not sure which ones.  Pain is mostly across low back but does occasionally have shooting pain down legs.  No numbness/tingling.  Pain increased significantly with gentle lumbar extension and flexion.  Pt was able to rotate slowly in sitting but limited ROM.   Discussed/educated on safe back movements (limiting bending, lifting, twisting), posture, transfers.  Applied heat pack in pillowcase. Recommended continuing to follow with PCP for back pain and possible outpt PT if warranted in future.    Exercises     Assessment/Plan    PT Assessment Patient needs continued PT services  PT Problem List Decreased strength;Decreased mobility;Decreased range of motion;Decreased activity tolerance;Cardiopulmonary status limiting activity;Decreased balance;Decreased knowledge of use of DME;Pain       PT Treatment Interventions DME instruction;Therapeutic activities;Modalities;Gait training;Therapeutic  exercise;Patient/family education;Stair training;Balance training;Functional mobility training;Manual techniques    PT Goals (Current goals can be found in the Care Plan section)  Acute Rehab PT Goals Patient Stated Goal: decrease pain and go home PT Goal Formulation: With patient Time For Goal Achievement: 08/28/19 Potential to Achieve Goals: Good    Frequency Min 3X/week   Barriers to discharge        Co-evaluation               AM-PAC PT "6 Clicks" Mobility  Outcome Measure Help needed turning from your back to your side while in a flat bed without using bedrails?: None Help needed moving from lying on your back to sitting on the side of a flat bed without using bedrails?: None Help needed moving to and from a bed to a chair (including a wheelchair)?: None Help needed standing up from a chair using your arms (e.g., wheelchair or bedside chair)?: None Help needed to walk in hospital room?: None Help needed climbing 3-5 steps with a railing? : A Little 6 Click Score: 23    End of Session Equipment Utilized During Treatment: Gait belt Activity Tolerance: Patient limited by pain Patient left: in chair;with call bell/phone within reach  Nurse Communication: Mobility status PT Visit Diagnosis: Other abnormalities of gait and mobility (R26.89);Pain Pain - part of body: (back)    Time: 1710-1734 PT Time Calculation (min) (ACUTE ONLY): 24 min   Charges:   PT Evaluation $PT Eval Low Complexity: 1 Low PT Treatments $Therapeutic Activity: 8-22 mins        Kayla [name], PT Acute Rehab Services Pager [phone] [facility] [phone] [address] [phone]   Kayla [name] 08/14/2019, 5:54 PM

## 2019-08-15 DIAGNOSIS — N390 Urinary tract infection, site not specified: Secondary | ICD-10-CM

## 2019-08-15 HISTORY — DX: Urinary tract infection, site not specified: N39.0

## 2019-08-15 LAB — URINALYSIS, ROUTINE W REFLEX MICROSCOPIC
Bilirubin Urine: NEGATIVE
Glucose, UA: NEGATIVE mg/dL
Hgb urine dipstick: NEGATIVE
Ketones, ur: NEGATIVE mg/dL
Nitrite: NEGATIVE
Protein, ur: 100 mg/dL — AB
Specific Gravity, Urine: 1.014 (ref 1.005–1.030)
WBC, UA: >50 WBC/hpf — ABNORMAL HIGH (ref 0–5)
pH: 5 (ref 5.0–8.0)

## 2019-08-15 LAB — CBC WITH DIFFERENTIAL/PLATELET
Abs Immature Granulocytes: 0.04 10*3/uL (ref 0.00–0.07)
Basophils Absolute: 0.1 10*3/uL (ref 0.0–0.1)
Basophils Relative: 1 %
Eosinophils Absolute: 0.2 10*3/uL (ref 0.0–0.5)
Eosinophils Relative: 1 %
HCT: 32.6 % — ABNORMAL LOW (ref 36.0–46.0)
Hemoglobin: 10.1 g/dL — ABNORMAL LOW (ref 12.0–15.0)
Immature Granulocytes: 0 %
Lymphocytes Relative: 23 %
Lymphs Abs: 2.8 10*3/uL (ref 0.7–4.0)
MCH: 28.4 pg (ref 26.0–34.0)
MCHC: 31 g/dL (ref 30.0–36.0)
MCV: 91.6 fL (ref 80.0–100.0)
Monocytes Absolute: 1 10*3/uL (ref 0.1–1.0)
Monocytes Relative: 8 %
Neutro Abs: 7.8 10*3/uL — ABNORMAL HIGH (ref 1.7–7.7)
Neutrophils Relative %: 67 %
Platelets: 467 10*3/uL — ABNORMAL HIGH (ref 150–400)
RBC: 3.56 MIL/uL — ABNORMAL LOW (ref 3.87–5.11)
RDW: 15.1 % (ref 11.5–15.5)
WBC: 11.9 10*3/uL — ABNORMAL HIGH (ref 4.0–10.5)
nRBC: 0 % (ref 0.0–0.2)

## 2019-08-15 LAB — GLUCOSE, CAPILLARY
Glucose-Capillary: 164 mg/dL — ABNORMAL HIGH (ref 70–99)
Glucose-Capillary: 187 mg/dL — ABNORMAL HIGH (ref 70–99)
Glucose-Capillary: 192 mg/dL — ABNORMAL HIGH (ref 70–99)
Glucose-Capillary: 212 mg/dL — ABNORMAL HIGH (ref 70–99)

## 2019-08-15 LAB — COMPREHENSIVE METABOLIC PANEL
ALT: 15 U/L (ref 0–44)
AST: 20 U/L (ref 15–41)
Albumin: 2.6 g/dL — ABNORMAL LOW (ref 3.5–5.0)
Alkaline Phosphatase: 64 U/L (ref 38–126)
Anion gap: 13 (ref 5–15)
BUN: 49 mg/dL — ABNORMAL HIGH (ref 8–23)
CO2: 25 mmol/L (ref 22–32)
Calcium: 8.6 mg/dL — ABNORMAL LOW (ref 8.9–10.3)
Chloride: 100 mmol/L (ref 98–111)
Creatinine, Ser: 1.45 mg/dL — ABNORMAL HIGH (ref 0.44–1.00)
GFR calc Af Amer: 40 mL/min — ABNORMAL LOW (ref >60)
GFR calc non Af Amer: 34 mL/min — ABNORMAL LOW (ref >60)
Glucose, Bld: 204 mg/dL — ABNORMAL HIGH (ref 70–99)
Potassium: 3.8 mmol/L (ref 3.5–5.1)
Sodium: 138 mmol/L (ref 135–145)
Total Bilirubin: 0.5 mg/dL (ref 0.3–1.2)
Total Protein: 6.6 g/dL (ref 6.5–8.1)

## 2019-08-15 MED ORDER — FUROSEMIDE 10 MG/ML IJ SOLN: 40.0000 mg | Freq: Every day | INTRAMUSCULAR | Status: DC

## 2019-08-15 NOTE — Progress Notes (Addendum)
Pt c/o pain with urination. Pt wants meds for UTI. Pt reports having a hx of frequent UTIs and takes Cystex OTC at home. Pt had been using the PureWick female external urinary catheter while on IV Lasix. Pt states that the pain would be unbearable by the morning. RN educated pt that she would need a UA first to evaluate. MD paged.

## 2019-08-15 NOTE — Progress Notes (Signed)
Assesment/PLAN  mMRC 3-4 dyspneamultifactorial likely exacerbation primarily acute superimposed HFpEF COPD stage II-III -1.4 L, weight 105-104 Change bid -->Lasix 40 IV once daily (home dose 40 p.o. twice daily) Metoprolol 50 twice daily for now-resume Aldactone cautiously 12.5 --hold losartan, amlodipine ongoing Continue inhalers Flonase, Symbicort, Incruse--stopped steroids Leukocytosis CXR from 4/12 PM negative PNA--2/2 steroids A. fib, PPM Medtronic Rate controlled with some tachycardia-meds as above Nonobstructive CAD on cath 2006 Not on aspirin? Morbid obesity BMI 37, OSA on CPAP Did not use CPAP last night-patient encouraged to get a CPAP machine from home DM TY 2 with neuropathy Continue Lantus 50 twice daily, sliding scale coverage-[phone] Neuropathy Continue B12, continue gabapentin 800 - 3 times daily and may need dose adjustment as outpatient Hypothyroid Continue Synthroid 175 every morning-TSH in 1 to 2 weeks History gout continue allopurinol 100 twice daily  DVT-on apixaban, Family discussed with husband present at bedside Dispo: transitioning back to home medications, watch creatinine-discharge in 47 h--no need HH  S -78 white female COPD likely Gold stage III-no oxygen CHF PPM Medtronic dual, nonobstructive CAD on cath 2006 11/2015 2/2 SSS/A. fib on apixaban, BMI 37 OSA/CPAP, carotid disease mild 4/17  baseline clinic weight between 234-238 pounds  Relates SOB, DOE X 6 months more acutely worsening since 08/10/2019-over the past 6 months has been using BiPAP even during the day-can ambulate several steps and then has to stop because of shortness of breath despite taking all inhalers Also relays to me was started on apixaban about a month/month and a half prior based on event monitor showing junctional rhythm high 30s and low 40s   TODAY Feels much better --walked in hallways. No cp f eating well   O BP (!) 152/61 (BP Location: Left Arm)   Pulse 61   Temp 97.9  F (36.6 C) (Oral)   Resp 19   Ht 5\' 6"  (1.676 m)   Wt 104.5 kg Comment: scale c  SpO2 95%   BMI 37.17 kg/m   Labs/studies reviewed today  BUN/creatinine 35/1.2-->49/1.45 AG 13 WBC down from 17-->15.4-->11.9 hemoglobin 10.6

## 2019-08-16 ENCOUNTER — Encounter (HOSPITAL_COMMUNITY): Payer: Self-pay | Admitting: Internal Medicine

## 2019-08-16 DIAGNOSIS — R079 Chest pain, unspecified: Secondary | ICD-10-CM

## 2019-08-16 DIAGNOSIS — J441 Chronic obstructive pulmonary disease with (acute) exacerbation: Secondary | ICD-10-CM

## 2019-08-16 DIAGNOSIS — J9601 Acute respiratory failure with hypoxia: Secondary | ICD-10-CM

## 2019-08-16 LAB — CBC WITH DIFFERENTIAL/PLATELET
Abs Immature Granulocytes: 0.05 10*3/uL (ref 0.00–0.07)
Basophils Absolute: 0.1 10*3/uL (ref 0.0–0.1)
Basophils Relative: 1 %
Eosinophils Absolute: 0.3 10*3/uL (ref 0.0–0.5)
Eosinophils Relative: 3 %
HCT: 34 % — ABNORMAL LOW (ref 36.0–46.0)
Hemoglobin: 10.4 g/dL — ABNORMAL LOW (ref 12.0–15.0)
Immature Granulocytes: 0 %
Lymphocytes Relative: 23 %
Lymphs Abs: 2.7 10*3/uL (ref 0.7–4.0)
MCH: 28.1 pg (ref 26.0–34.0)
MCHC: 30.6 g/dL (ref 30.0–36.0)
MCV: 91.9 fL (ref 80.0–100.0)
Monocytes Absolute: 0.9 10*3/uL (ref 0.1–1.0)
Monocytes Relative: 8 %
Neutro Abs: 7.5 10*3/uL (ref 1.7–7.7)
Neutrophils Relative %: 65 %
Platelets: 467 10*3/uL — ABNORMAL HIGH (ref 150–400)
RBC: 3.7 MIL/uL — ABNORMAL LOW (ref 3.87–5.11)
RDW: 14.8 % (ref 11.5–15.5)
WBC: 11.6 10*3/uL — ABNORMAL HIGH (ref 4.0–10.5)
nRBC: 0 % (ref 0.0–0.2)

## 2019-08-16 LAB — COMPREHENSIVE METABOLIC PANEL
ALT: 16 U/L (ref 0–44)
AST: 21 U/L (ref 15–41)
Albumin: 2.6 g/dL — ABNORMAL LOW (ref 3.5–5.0)
Alkaline Phosphatase: 67 U/L (ref 38–126)
Anion gap: 11 (ref 5–15)
BUN: 47 mg/dL — ABNORMAL HIGH (ref 8–23)
CO2: 27 mmol/L (ref 22–32)
Calcium: 8.9 mg/dL (ref 8.9–10.3)
Chloride: 101 mmol/L (ref 98–111)
Creatinine, Ser: 1.35 mg/dL — ABNORMAL HIGH (ref 0.44–1.00)
GFR calc Af Amer: 43 mL/min — ABNORMAL LOW (ref >60)
GFR calc non Af Amer: 38 mL/min — ABNORMAL LOW (ref >60)
Glucose, Bld: 174 mg/dL — ABNORMAL HIGH (ref 70–99)
Potassium: 4.2 mmol/L (ref 3.5–5.1)
Sodium: 139 mmol/L (ref 135–145)
Total Bilirubin: 0.3 mg/dL (ref 0.3–1.2)
Total Protein: 6.7 g/dL (ref 6.5–8.1)

## 2019-08-16 LAB — GLUCOSE, CAPILLARY: Glucose-Capillary: 146 mg/dL — ABNORMAL HIGH (ref 70–99)

## 2019-08-16 MED ORDER — FUROSEMIDE 40 MG PO TABS: 40.0000 mg | ORAL_TABLET | Freq: Two times a day (BID) | ORAL | 0 refills | Status: DC

## 2019-08-16 MED ORDER — CEFDINIR 300 MG PO CAPS: 300.0000 mg | ORAL_CAPSULE | Freq: Two times a day (BID) | ORAL | 0 refills | Status: AC

## 2019-08-16 MED ORDER — SODIUM CHLORIDE 0.9 % IV SOLN
1.0000 g | Freq: Every day | INTRAVENOUS | Status: DC
  Administered 2019-08-16: 1 g via INTRAVENOUS
  Filled 2019-08-16: qty 10

## 2019-08-16 MED ORDER — SPIRONOLACTONE 25 MG PO TABS: 12.5000 mg | ORAL_TABLET | Freq: Every day | ORAL | 0 refills | Status: DC

## 2019-08-16 NOTE — Discharge Instructions (Signed)
Heart Failure, Diagnosis  Heart failure means that your heart is not able to pump blood in the right way. This makes it hard for your body to work well. Heart failure is usually a long-term (chronic) condition. You must take good care of yourself and follow your treatment plan from your doctor. What are the causes? This condition may be caused by:  High blood pressure.  Build up of cholesterol and fat in the arteries.  Heart attack. This injures the heart muscle.  Heart valves that do not open and close properly.  Damage of the heart muscle. This is also called cardiomyopathy.  Lung disease.  Abnormal heart rhythms. What increases the risk? The risk of heart failure goes up as a person ages. This condition is also more likely to develop in people who:  Are overweight.  Are female.  Smoke or chew tobacco.  Abuse alcohol or illegal drugs.  Have taken medicines that can damage the heart.  Have diabetes.  Have abnormal heart rhythms.  Have thyroid problems.  Have low blood counts (anemia). What are the signs or symptoms? Symptoms of this condition include:  Shortness of breath.  Coughing.  Swelling of the feet, ankles, legs, or belly.  Losing weight for no reason.  Trouble breathing.  Waking from sleep because of the need to sit up and get more air.  Rapid heartbeat.  Being very tired.  Feeling dizzy, or feeling like you may pass out (faint).  Having no desire to eat.  Feeling like you may vomit (nauseous).  Peeing (urinating) more at night.  Feeling confused. How is this treated?     This condition may be treated with:  Medicines. These can be given to treat blood pressure and to make the heart muscles stronger.  Changes in your daily life. These may include eating a healthy diet, staying at a healthy body weight, quitting tobacco and illegal drug use, or doing exercises.  Surgery. Surgery can be done to open blocked valves, or to put devices in  the heart, such as pacemakers.  A donor heart (heart transplant). You will receive a healthy heart from a donor. Follow these instructions at home:  Treat other conditions as told by your doctor. These may include high blood pressure, diabetes, thyroid disease, or abnormal heart rhythms.  Learn as much as you can about heart failure.  Get support as you need it.  Keep all follow-up visits as told by your doctor. This is important. Summary  Heart failure means that your heart is not able to pump blood in the right way.  This condition is caused by high blood pressure, heart attack, or damage of the heart muscle.  Symptoms of this condition include shortness of breath and swelling of the feet, ankles, legs, or belly. You may also feel very tired or feel like you may vomit.  You may be treated with medicines, surgery, or changes in your daily life.  Treat other health conditions as told by your doctor. This information is not intended to replace advice given to you by your health care provider. Make sure you discuss any questions you have with your health care provider. Document Revised: 07/08/2018 Document Reviewed: 07/08/2018 Elsevier Patient Education  [place].   Heart Failure, Self Care Heart failure is a serious condition. This sheet explains things you need to do to take care of yourself at home. To help you stay as healthy as possible, you may be asked to change your diet, take  certain medicines, and make other changes in your life. Your doctor may also give you more specific instructions. If you have problems or questions, call your doctor. What are the risks? Having heart failure makes it more likely for you to have some problems. These problems can get worse if you do not take good care of yourself. Problems may include:  Blood clotting problems. This may cause a stroke.  Damage to the kidneys, liver, or lungs.  Abnormal heart rhythms. Supplies needed:  Scale  for weighing yourself.  Blood pressure monitor.  Notebook.  Medicines. How to care for yourself when you have heart failure Medicines Take over-the-counter and prescription medicines only as told by your doctor. Take your medicines every day.  Do not stop taking your medicine unless your doctor tells you to do so.  Do not skip any medicines.  Get your prescriptions refilled before you run out of medicine. This is important. Eating and drinking   Eat heart-healthy foods. Talk with a diet specialist (dietitian) to create an eating plan.  Choose foods that: ? Have no trans fat. ? Are low in saturated fat and cholesterol.  Choose healthy foods, such as: ? Fresh or frozen fruits and vegetables. ? Fish. ? Low-fat (lean) meats. ? Legumes, such as beans, peas, and lentils. ? Fat-free or low-fat dairy products. ? Whole-grain foods. ? High-fiber foods.  Limit salt (sodium) if told by your doctor. Ask your diet specialist to tell you which seasonings are healthy for your heart.  Cook in healthy ways instead of frying. Healthy ways of cooking include roasting, grilling, broiling, baking, poaching, steaming, and stir-frying.  Limit how much fluid you drink, if told by your doctor. Alcohol use  Do not drink alcohol if: ? Your doctor tells you not to drink. ? Your heart was damaged by alcohol, or you have very bad heart failure. ? You are pregnant, may be pregnant, or are planning to become pregnant.  If you drink alcohol: ? Limit how much you use to:  0-1 drink a day for women.  0-2 drinks a day for men. ? Be aware of how much alcohol is in your drink. In the [place] one drink equals one 12 oz bottle of beer (355 mL), one 5 oz glass of wine (148 mL), or one 1 oz glass of hard liquor (44 mL). Lifestyle   Do not use any products that contain nicotine or tobacco, such as cigarettes, e-cigarettes, and chewing tobacco. If you need help quitting, ask your doctor. ? Do not use  nicotine gum or patches before talking to your doctor.  Do not use illegal drugs.  Lose weight if told by your doctor.  Do physical activity if told by your doctor. Talk to your doctor before you begin an exercise if: ? You are an older adult. ? You have very bad heart failure.  Learn to manage stress. If you need help, ask your doctor.  Get rehab (rehabilitation) to help you stay independent and to help with your quality of life.  Plan time to rest when you get tired. Check weight and blood pressure   Weigh yourself every day. This will help you to know if fluid is building up in your body. ? Weigh yourself every morning after you pee (urinate) and before you eat breakfast. ? Wear the same amount of clothing each time. ? Write down your daily weight. Give your record to your doctor.  Check and write down your blood pressure as told by  your doctor.  Check your pulse as told by your doctor. Dealing with very hot and very cold weather  If it is very hot: ? Avoid activities that take a lot of energy. ? Use air conditioning or fans, or find a cooler place. ? Avoid caffeine and alcohol. ? Wear clothing that is loose-fitting, lightweight, and light-colored.  If it is very cold: ? Avoid activities that take a lot of energy. ? Layer your clothes. ? Wear mittens or gloves, a hat, and a scarf when you go outside. ? Avoid alcohol. Follow these instructions at home:  Stay up to date with shots (vaccines). Get pneumococcal and flu (influenza) shots.  Keep all follow-up visits as told by your doctor. This is important. Contact a doctor if:  You gain weight quickly.  You have increasing shortness of breath.  You cannot do your normal activities.  You get tired easily.  You cough a lot.  You don't feel like eating or feel like you may vomit (nauseous).  You become puffy (swell) in your hands, feet, ankles, or belly (abdomen).  You cannot sleep well because it is hard to  breathe.  You feel like your heart is beating fast (palpitations).  You get dizzy when you stand up. Get help right away if:  You have trouble breathing.  You or someone else notices a change in your behavior, such as having trouble staying awake.  You have chest pain or discomfort.  You pass out (faint). These symptoms may be an emergency. Do not wait to see if the symptoms will go away. Get medical help right away. Call your local emergency services (911 in the [place] Do not drive yourself to the hospital. Summary  Heart failure is a serious condition. To care for yourself, you may have to change your diet, take medicines, and make other lifestyle changes.  Take your medicines every day. Do not stop taking them unless your doctor tells you to do so.  Eat heart-healthy foods, such as fresh or frozen fruits and vegetables, fish, lean meats, legumes, fat-free or low-fat dairy products, and whole-grain or high-fiber foods.  Ask your doctor if you can drink alcohol. You may have to stop alcohol use if you have very bad heart failure.  Contact your doctor if you gain weight quickly or feel that your heart is beating too fast. Get help right away if you pass out, or have chest pain or trouble breathing. This information is not intended to replace advice given to you by your health care provider. Make sure you discuss any questions you have with your health care provider. Document Revised: 08/02/2018 Document Reviewed: 08/03/2018 Elsevier Patient Education  [place].   Heart Failure Action Plan A heart failure action plan helps you understand what to do when you have symptoms of heart failure. Follow the plan that was created by you and your health care provider. Review your plan each time you visit your health care provider. Red zone These signs and symptoms mean you should get medical help right away:  You have trouble breathing when resting.  You have a dry cough that is  getting worse.  You have swelling or pain in your legs or abdomen that is getting worse.  You suddenly gain more than 2-3 lb (0.9-1.4 kg) in a day, or more than 5 lb (2.3 kg) in one week. This amount may be more or less depending on your condition.  You have trouble staying awake or you feel  confused.  You have chest pain.  You do not have an appetite.  You pass out. If you experience any of these symptoms:  Call your local emergency services (911 in the [place] right away or seek help at the emergency department of the nearest hospital. Yellow zone These signs and symptoms mean your condition may be getting worse and you should make some changes:  You have trouble breathing when you are active or you need to sleep with extra pillows.  You have swelling in your legs or abdomen.  You gain 2-3 lb (0.9-1.4 kg) in one day, or 5 lb (2.3 kg) in one week. This amount may be more or less depending on your condition.  You get tired easily.  You have trouble sleeping.  You have a dry cough. If you experience any of these symptoms:  Contact your health care provider within the next day.  Your health care provider may adjust your medicines. Green zone These signs mean you are doing well and can continue what you are doing:  You do not have shortness of breath.  You have very little swelling or no new swelling.  Your weight is stable (no gain or loss).  You have a normal activity level.  You do not have chest pain or any other new symptoms. Follow these instructions at home:  Take over-the-counter and prescription medicines only as told by your health care provider.  Weigh yourself daily. Your target weight is __________ lb (__________ kg). ? Call your health care provider if you gain more than __________ lb (__________ kg) in a day, or more than __________ lb (__________ kg) in one week.  Eat a heart-healthy diet. Work with a diet and nutrition specialist (dietitian) to create  an eating plan that is best for you.  Keep all follow-up visits as told by your health care provider. This is important. Where to find more information  American Heart Association: www.heart.org Summary  Follow the action plan that was created by you and your health care provider.  Get help right away if you have any symptoms in the Red zone. This information is not intended to replace advice given to you by your health care provider. Make sure you discuss any questions you have with your health care provider. Document Revised: 04/02/2017 Document Reviewed: 05/30/2016 Elsevier Patient Education  2020 [place].   Heart Failure Eating Plan Heart failure, also called congestive heart failure, occurs when your heart does not pump blood well enough to meet your body's needs for oxygen-rich blood. Heart failure is a long-term (chronic) condition. Living with heart failure can be challenging. However, following your health care provider's instructions about a healthy lifestyle and working with a diet and nutrition specialist (dietitian) to choose the right foods may help to improve your symptoms. What are tips for following this plan? Reading food labels  Check food labels for the amount of sodium per serving. Choose foods that have less than 140 mg (milligrams) of sodium in each serving.  Check food labels for the number of calories per serving. This is important if you need to limit your daily calorie intake to lose weight.  Check food labels for the serving size. If you eat more than one serving, you will be eating more sodium and calories than what is listed on the label.  Look for foods that are labeled as "sodium-free," "very low sodium," or "low sodium." ? Foods labeled as "reduced sodium" or "lightly salted" may still have more  sodium than what is recommended for you. Cooking  Avoid adding salt when cooking. Ask your health care provider or dietitian before using salt  substitutes.  Season food with salt-free seasonings, spices, or herbs. Check the label of seasoning mixes to make sure they do not contain salt.  Cook with heart-healthy oils, such as olive, canola, soybean, or sunflower oil.  Do not fry foods. Cook foods using low-fat methods, such as baking, boiling, grilling, and broiling.  Limit unhealthy fats when cooking by: ? Removing the skin from poultry, such as chicken. ? Removing all visible fats from meats. ? Skimming the fat off from stews, soups, and gravies before serving them. Meal planning   Limit your intake of: ? Processed, canned, or pre-packaged foods. ? Foods that are high in trans fat, such as fried foods. ? Sweets, desserts, sugary drinks, and other foods with added sugar. ? Full-fat dairy products, such as whole milk.  Eat a balanced diet that includes: ? 4-5 servings of fruit each day and 4-5 servings of vegetables each day. At each meal, try to fill half of your plate with fruits and vegetables. ? Up to 6-8 servings of whole grains each day. ? Up to 2 servings of lean meat, poultry, or fish each day. One serving of meat is equal to 3 oz. This is about the same size as a deck of cards. ? 2 servings of low-fat dairy each day. ? Heart-healthy fats. Healthy fats called omega-3 fatty acids are found in foods such as flaxseed and cold-water fish like sardines, salmon, and mackerel.  Aim to eat 25-35 g (grams) of fiber a day. Foods that are high in fiber include apples, broccoli, carrots, beans, peas, and whole grains.  Do not add salt or condiments that contain salt (such as soy sauce) to foods before eating.  When eating at a restaurant, ask that your food be prepared with less salt or no salt, if possible.  Try to eat 2 or more vegetarian meals each week.  Eat more home-cooked food and eat less restaurant, buffet, and fast food. General information  Do not eat more than 2,300 mg of salt (sodium) a day. The amount of  sodium that is recommended for you may be lower, depending on your condition.  Maintain a healthy body weight as directed. Ask your health care provider what a healthy weight is for you. ? Check your weight every day. ? Work with your health care provider and dietitian to make a plan that is right for you to lose weight or maintain your current weight.  Limit how much fluid you drink. Ask your health care provider or dietitian how much fluid you can have each day.  Limit or avoid alcohol as told by your health care provider or dietitian. Recommended foods The items listed may not be a complete list. Talk with your dietitian about what dietary choices are best for you. Fruits All fresh, frozen, and canned fruits. Dried fruits, such as raisins, prunes, and cranberries. Vegetables All fresh vegetables. Vegetables that are frozen without sauce or added salt. Low-sodium or sodium-free canned vegetables. Grains Bread with less than 80 mg of sodium per slice. Whole-wheat pasta, quinoa, and brown rice. Oats and oatmeal. Barley. [place]. Grits and cream of wheat. Whole-grain and whole-wheat cold cereal. Meats and other protein foods Lean cuts of meat. Skinless chicken and [place]. Fish with high omega-3 fatty acids, such as salmon, sardines, and other cold-water fishes. Eggs. Dried beans, peas, and edamame. Unsalted  nuts and nut butters. Dairy Low-fat or nonfat (skim) milk and dried milk. Rice milk, soy milk, and almond milk. Low-fat or nonfat yogurt. Small amounts of reduced-sodium block cheese. Low-sodium cottage cheese. Fats and oils Olive, canola, soybean, flaxseed, or sunflower oil. Avocado. Sweets and desserts Apple sauce. Granola bars. Sugar-free pudding and gelatin. Frozen fruit bars. Seasoning and other foods Fresh and dried herbs. Lemon or lime juice. Vinegar. Low-sodium ketchup. Salt-free marinades, salad dressings, sauces, and seasonings. The items listed above may not be a complete list  of foods and beverages you can eat. Contact a dietitian for more information. Foods to avoid The items listed may not be a complete list. Talk with your dietitian about what dietary choices are best for you. Fruits Fruits that are dried with sodium-containing preservatives. Vegetables Canned vegetables. Frozen vegetables with sauce or seasonings. Creamed vegetables. [place] fries. Onion rings. Pickled vegetables and sauerkraut. Grains Bread with more than 80 mg of sodium per slice. Hot or cold cereal with more than 140 mg sodium per serving. Salted pretzels and crackers. Pre-packaged breadcrumbs. Bagels, croissants, and biscuits. Meats and other protein foods Ribs and chicken wings. Bacon, ham, pepperoni, bologna, salami, and packaged luncheon meats. Hot dogs, bratwurst, and sausage. Canned meat. Smoked meat and fish. Salted nuts and seeds. Dairy Whole milk, half-and-half, and cream. Buttermilk. Processed cheese, cheese spreads, and cheese curds. Regular cottage cheese. Feta cheese. Shredded cheese. String cheese. Fats and oils Butter, lard, shortening, ghee, and bacon fat. Canned and packaged gravies. Seasoning and other foods Onion salt, garlic salt, table salt, and sea salt. Marinades. Regular salad dressings. Relishes, pickles, and olives. Meat flavorings and tenderizers, and bouillon cubes. Horseradish, ketchup, and mustard. Worcestershire sauce. Teriyaki sauce, soy sauce (including reduced sodium). Hot sauce and Tabasco sauce. Steak sauce, fish sauce, oyster sauce, and cocktail sauce. Taco seasonings. Barbecue sauce. Tartar sauce. The items listed above may not be a complete list of foods and beverages you should avoid. Contact a dietitian for more information. Summary  A heart failure eating plan includes changes that limit your intake of sodium and unhealthy fat, and it may help you lose weight or maintain a healthy weight. Your health care provider may also recommend limiting how much  fluid you drink.  Most people with heart failure should eat no more than 2,300 mg of salt (sodium) a day. The amount of sodium that is recommended for you may be lower, depending on your condition.  Contact your health care provider or dietitian before making any major changes to your diet. This information is not intended to replace advice given to you by your health care provider. Make sure you discuss any questions you have with your health care provider. Document Revised: 06/16/2018 Document Reviewed: 09/04/2016 Elsevier Patient Education  [place] on my medicine - ELIQUIS (apixaban)  This medication education was reviewed with me or my healthcare representative as part of my discharge preparation.  The pharmacist that spoke with me during my hospital stay was:  Onnie Boer, RPH-CPP  Why was Eliquis prescribed for you? Eliquis was prescribed for you to reduce the risk of a blood clot forming that can cause a stroke if you have a medical condition called atrial fibrillation (a type of irregular heartbeat).  What do You need to know about Eliquis ? Take your Eliquis TWICE DAILY - one tablet in the morning and one tablet in the evening with or without food. If you have difficulty swallowing the tablet whole please  discuss with your pharmacist how to take the medication safely.  Take Eliquis exactly as prescribed by your doctor and DO NOT stop taking Eliquis without talking to the doctor who prescribed the medication.  Stopping may increase your risk of developing a stroke.  Refill your prescription before you run out.  After discharge, you should have regular check-up appointments with your healthcare provider that is prescribing your Eliquis.  In the future your dose may need to be changed if your kidney function or weight changes by a significant amount or as you get older.  What do you do if you miss a dose? If you miss a dose, take it as soon as you remember  on the same day and resume taking twice daily.  Do not take more than one dose of ELIQUIS at the same time to make up a missed dose.  Important Safety Information A possible side effect of Eliquis is bleeding. You should call your healthcare provider right away if you experience any of the following: ? Bleeding from an injury or your nose that does not stop. ? Unusual colored urine (red or dark brown) or unusual colored stools (red or black). ? Unusual bruising for unknown reasons. ? A serious fall or if you hit your head (even if there is no bleeding).  Some medicines may interact with Eliquis and might increase your risk of bleeding or clotting while on Eliquis. To help avoid this, consult your healthcare provider or pharmacist prior to using any new prescription or non-prescription medications, including herbals, vitamins, non-steroidal anti-inflammatory drugs (NSAIDs) and supplements.  This website has more information on Eliquis (apixaban): http://[email or web address].[email or web address]/home

## 2019-08-16 NOTE — Discharge Summary (Signed)
Physician Discharge Summary  Kayla [name] OAC:[id number] DOB: [birth year] DOA: 08/12/2019  PCP: [name], MD  Admit date: 08/12/2019 Discharge date: 08/16/2019  Admitted From: Home Disposition: Home  Recommendations for Outpatient Follow-up:  1. Follow up with PCP in 1-2 weeks 2. Please obtain BMP in one week to assess renal function 3. Discharged on cefdinir to complete a 5-day course for UTI 4. Please follow up on the following pending results: Urine culture 5. Follow-up on weight log, needs continued education on diet and weight loss measures  Home Health: No Equipment/Devices: None  Discharge Condition: Stable CODE STATUS: Full code Diet recommendation: Consistent carbohydrate diet, heart healthy diet, 2 g salt restriction, 1558mL fluid restriction  History of present illness:  Kayla [name] is a 79 y.o. female with medical history significant of nonobstructive CAD per cath done in 2006, carotid artery disease, sinus node dysfunction status post PPM, A. fib on Eliquis, chronic diastolic CHF, CKD stage III, COPD, insulin-dependent diabetes, hypertension, hyperlipidemia, hypothyroidism, GERD, OSA on CPAP, anxiety presenting with complaints of chest pain and shortness of breath.  Chest pain relieved after patient took 2 sublingual nitroglycerin at home.  EMS gave additional nitro x2.  Initial blood pressure 010 systolic, improved to 932/35 after nitroglycerin.  Patient reports 2-day history of dyspnea on exertion.  Reports having a mild cough and wheezing. Today she started having some chest discomfort so she took 2 sublingual nitroglycerins at home.  EMS gave her another 2 nitroglycerin which helped with the chest pain but her shortness of breath persisted.  She was told that her blood pressure was very high but reports compliance with her home medications. Does report orthopnea and bilateral lower extremity edema.  States she takes Lasix 40 mg twice daily.  In the ED,  patient was afebrile.  Blood pressure slightly elevated.  Requiring 4 L supplemental oxygen. WBC count 17.2.  Hemoglobin 9.5, was above 11 on previous labs.  MCV 93.  Potassium 5.3.  Blood glucose 276.  Bicarb 22, anion gap 11.  Creatinine 1.2, at baseline.  BNP 479.  High-sensitivity troponin negative x2.  EKG without acute ischemic changes.  UA not suggestive of infection. Chest x-ray showing mild bibasilar atelectasis without acute or active cardiopulmonary disease. Patient received IV Lasix 40 mg and prednisone 20 mg.  Hospital course:  Acute respiratory failure with hypoxia Acute on chronic diastolic congestive heart failure Patient presenting to the ED with 2-day history of progressive shortness of breath with associated orthopnea and bilateral lower extremity edema.  Patient states Lasix twice daily at home, but admits to poor compliance with dietary habits and fluid intake.  Nasal cannula to maintain adequate oxygenation on presentation, in which she is not on home O2.  TTE with EF 65-70%, moderate concentric LVH, right atrium moderate dilated, IVC dilated.  Patient was started on aggressive IV diuresis with furosemide twice daily with good urine output, she was net -4.8 L during hospitalization with weight down to 103.9 kg at discharge.  Continue losartan 100mg  p.o. daily, furosemide 40 mg p.o. twice daily, spironolactone 12.5 mg p.o. daily.  Patient instructed to maintain a daily weight log and to bring to next PCP visit.  Patient also instructed if she notices a significant upward or downward trend in terms of her weight, she is to contact her PCP for further instruction.  Patient will also need further education on dietary habits as this is likely leading etiology of her volume overload.  COPD Not oxygen dependent.  Continue home Incruse Ellipta and albuterol nebs as needed.  UTI Urinalysis with moderate leukoesterase, negative nitrite, many bacteria, greater than 50 WBCs.  Continue cefdinir  300 mg p.o. twice daily, plan 5-day course.  Follow-up urine culture which was pending at time of discharge.  Paroxysmal atrial fibrillation Hx SA node dysfunction status post PPM Continue home Eliquis and metoprolol.  Outpatient follow-up with cardiology as scheduled.  CKD stage IIIb Creatinine baseline 1.2-1.4 past year with a GFR 39-45..  Creatinine 1.27 on admission, trended up to 1.45 with aggressive IV diuresis during hospitalization.  Creatinine 1.35 at time of discharge.  Recommend repeat BMP in 1 week to assess creatinine level.  Essential hypertension Amlodipine 5 mg p.o. daily, losartan 100 mg p.o. daily, metoprolol tartrate 50 mg p.o. twice daily furosemide 40 mg p.o. twice daily and and spironolactone 12.5 mg p.o. daily.   HLD: Atorvastatin 40 mg p.o. daily  Type 2 diabetes mellitus Continue Lantus 130 units subcutaneously qAm and qPM.  NovoLog sliding scale with meals.  Gout: Continue allopurinol 100 mg p.o. twice daily  Hypothyroidism: Continue levothyroxine 125 mcg p.o. daily  OSA: Continue home CPAP   Obesity Body mass index is 36.96 kg/m.  Discussed with patient needs for aggressive lifestyle changes/weight loss as this complicates all facets of care.   Discharge Diagnoses:  Principal Problem:   Acute exacerbation of CHF (congestive heart failure) (HCC) Active Problems:   COPD with acute exacerbation (HCC)   Acute respiratory failure (HCC)   Chest pain   Leukocytosis    Discharge Instructions  Discharge Instructions    (HEART FAILURE PATIENTS) Call MD:  Anytime you have any of the following symptoms: 1) 3 pound weight gain in 24 hours or 5 pounds in 1 week 2) shortness of breath, with or without a dry hacking cough 3) swelling in the hands, feet or stomach 4) if you have to sleep on extra pillows at night in order to breathe.   Complete by: As directed    Call MD for:  difficulty breathing, headache or visual disturbances   Complete by: As directed     Call MD for:  extreme fatigue   Complete by: As directed    Call MD for:  persistant dizziness or light-headedness   Complete by: As directed    Call MD for:  persistant nausea and vomiting   Complete by: As directed    Call MD for:  severe uncontrolled pain   Complete by: As directed    Call MD for:  temperature >100.4   Complete by: As directed    Diet - low sodium heart healthy   Complete by: As directed    Increase activity slowly   Complete by: As directed      Allergies as of 08/16/2019      Reactions   Trulicity [dulaglutide] Nausea Only, Other (See Comments)   Made the patient sick to her stomach (only) several days after using it      Medication List    TAKE these medications   acetaminophen 500 MG tablet Commonly known as: TYLENOL Take 1,000 mg by mouth every 6 (six) hours as needed for moderate pain (for headaches).   albuterol (2.5 MG/3ML) 0.083% nebulizer solution Commonly known as: PROVENTIL Take 2.5 mg by nebulization every 2 (two) hours as needed for wheezing or shortness of breath.   albuterol 108 (90 Base) MCG/ACT inhaler Commonly known as: Ventolin HFA Inhale 2 puffs into the lungs every 6 (six) hours as needed  for wheezing or shortness of breath.   allopurinol 100 MG tablet Commonly known as: ZYLOPRIM Take 100 mg by mouth 2 (two) times daily.   amLODipine 5 MG tablet Commonly known as: NORVASC Take 1 tablet (5 mg total) by mouth daily.   apixaban 5 MG Tabs tablet Commonly known as: Eliquis Take 1 tablet (5 mg total) by mouth 2 (two) times daily.   atorvastatin 40 MG tablet Commonly known as: LIPITOR Take 1 tablet (40 mg total) by mouth daily. What changed: when to take this   budesonide-formoterol 160-4.5 MCG/ACT inhaler Commonly known as: Symbicort Inhale 2 puffs into the lungs 2 (two) times daily for 1 day. What changed:   when to take this  additional instructions   cefdinir 300 MG capsule Commonly known as: OMNICEF Take 1 capsule  (300 mg total) by mouth 2 (two) times daily for 4 days.   fexofenadine 180 MG tablet Commonly known as: ALLEGRA Take 180 mg by mouth daily as needed for allergies or rhinitis.   fluticasone 50 MCG/ACT nasal spray Commonly known as: FLONASE USE 2 SPRAYS NASALLY DAILY What changed:   how much to take  how to take this  when to take this  additional instructions   furosemide 40 MG tablet Commonly known as: LASIX Take 1 tablet (40 mg total) by mouth 2 (two) times daily.   gabapentin 800 MG tablet Commonly known as: NEURONTIN Take 800 mg by mouth See admin instructions. Take 800 mg by mouth three to four times a day   HYDROcodone-acetaminophen 5-325 MG tablet Commonly known as: NORCO/VICODIN Take 1-2 tablets by mouth every 6 (six) hours as needed for moderate pain. What changed:   how much to take  when to take this  additional instructions   Incruse Ellipta 62.5 MCG/INH Aepb Generic drug: umeclidinium bromide Inhale 1 puff into the lungs daily.   insulin glargine 100 UNIT/ML injection Commonly known as: LANTUS Inject 130 Units into the skin in the morning and at bedtime.   levothyroxine 175 MCG tablet Commonly known as: SYNTHROID Take 175 mcg by mouth daily before breakfast.   LORazepam 2 MG tablet Commonly known as: ATIVAN Take 2 mg by mouth at bedtime as needed for anxiety.   losartan 100 MG tablet Commonly known as: COZAAR Take 100 mg by mouth daily.   metoprolol tartrate 50 MG tablet Commonly known as: LOPRESSOR Take 50 mg by mouth in the morning and at bedtime.   nitroGLYCERIN 0.4 MG SL tablet Commonly known as: NITROSTAT Place 1 tablet (0.4 mg total) under the tongue every 5 (five) minutes as needed for chest pain.   NovoLOG FlexPen 100 UNIT/ML FlexPen Generic drug: insulin aspart Inject 8-13 Units into the skin See admin instructions. Inject 8-13 units into the skin three times a day with meals, per sliding scale (and an additional 1 unit for  every 50 points for a BGL >130)   Phenadoz 25 MG suppository Generic drug: promethazine Place 25 mg rectally daily as needed (for headaches).   predniSONE 10 MG tablet Commonly known as: DELTASONE Take 10 mg by mouth See admin instructions. Take 60 mg by mouth once daily on days 1 & 2, 50 mg once daily on days 3 & 4, 40 mg once daily on days 5 & 6, 30 mg once daily on days 7 & 8, 20 mg once daily on days 9 & 10, and 10 mg once daily on days 11 & 12   PRESCRIPTION MEDICATION CPAP: At bedtime and "  as needed for shortness of breath"   silver sulfADIAZINE 1 % cream Commonly known as: Silvadene Apply pea-sized amount to wound daily.   spironolactone 25 MG tablet Commonly known as: ALDACTONE Take 0.5 tablets (12.5 mg total) by mouth daily. What changed: how much to take   True Metrix Blood Glucose Test test strip Generic drug: glucose blood   TUSSIN DM MAX SUGAR-FREE PO Take 5 mLs by mouth every 4 (four) hours as needed (for coughing).   vitamin B-12 1000 MCG tablet Commonly known as: CYANOCOBALAMIN Take 1,000 mcg by mouth daily.   VITAMIN D-3 PO Take 1 capsule by mouth daily with breakfast.      Follow-up Information    Hamrick, Maura L, MD. Schedule an appointment as soon as possible for a visit in 1 week(s).   Specialty: Family Medicine Contact information: [place] [address] [phone]          Allergies  Allergen Reactions  . Trulicity [Dulaglutide] Nausea Only and Other (See Comments)    Made the patient sick to her stomach (only) several days after using it    Consultations:  None   Procedures/Studies: DG Chest 2 View  Result Date: 08/14/2019 CLINICAL DATA:  Shortness of breath EXAM: CHEST - 2 VIEW COMPARISON:  August 13, 2019 FINDINGS: The lungs are clear. Heart is upper normal in size with pulmonary vascularity normal. Pacemaker leads attached to right atrium and right ventricle. No adenopathy. Degenerative change in thoracic  spine. Surgical clips in the region of the thyroid. Postoperative change in lower cervical region. IMPRESSION: Lungs clear. Heart upper normal in size. Postoperative changes at several sites. Electronically Signed   By: Lowella Grip III M.D.   On: 08/14/2019 14:10   DG Chest Port 1 View  Result Date: 08/13/2019 CLINICAL DATA:  Shortness of breath. EXAM: PORTABLE CHEST 1 VIEW COMPARISON:  October 07, 2017 FINDINGS: There is a multi lead AICD. Mild atelectasis is seen within the bilateral lung bases, without evidence of acute infiltrate, pleural effusion or pneumothorax. The heart size and mediastinal contours are within normal limits. A radiopaque fusion plate and screws are seen overlying the lower cervical spine. Multilevel degenerative changes seen throughout the thoracic spine. IMPRESSION: Mild bibasilar atelectasis without acute or active cardiopulmonary disease. Electronically Signed   By: Virgina Norfolk M.D.   On: 08/13/2019 01:07   ECHOCARDIOGRAM COMPLETE  Result Date: 08/13/2019    ECHOCARDIOGRAM REPORT   Patient Name:   Kayla [name] Date of Exam: 08/13/2019 Medical Rec #:  [id number]        Height:       66.0 in Accession #:    [id number]       Weight:       230.0 lb Date of Birth:  11-28-1940       BSA:          2.122 m Patient Age:    23 years         BP:           174/107 mmHg Patient Gender: F                HR:           84 bpm. Exam Location:  Inpatient Procedure: 2D Echo Indications:    CHF-Acute Diastolic 094.07 / W80.88  History:        Patient has prior history of Echocardiogram examinations, most  recent 07/31/2013. COPD, Arrythmias:Palpitations; Risk                 Factors:Dyslipidemia, Diabetes and Sleep Apnea. Acute                 respiratory failure.  Sonographer:    Vikki Ports Turrentine Referring Phys: [id number] [place]  1. Left ventricular ejection fraction, by estimation, is 65 to 70%. The left ventricle has normal function. The left  ventricle has no regional wall motion abnormalities. There is moderate concentric left ventricular hypertrophy. Unable to assess LV diastolic filling due to underlying atrial fibrillation.  2. Right ventricular systolic function is normal. The right ventricular size is normal. There is normal pulmonary artery systolic pressure.  3. Right atrial size was moderately dilated.  4. The mitral valve is normal in structure. No evidence of mitral valve regurgitation. No evidence of mitral stenosis.  5. The aortic valve is tricuspid. Aortic valve regurgitation is not visualized. Mild aortic valve sclerosis is present, with no evidence of aortic valve stenosis.  6. The inferior vena cava is dilated in size with >50% respiratory variability, suggesting right atrial pressure of 8 mmHg. FINDINGS  Left Ventricle: Left ventricular ejection fraction, by estimation, is 65 to 70%. The left ventricle has normal function. The left ventricle has no regional wall motion abnormalities. The left ventricular internal cavity size was normal in size. There is  moderate concentric left ventricular hypertrophy. Unable to assess LV diastolic filling due to underlying atrial fibrillation. Right Ventricle: The right ventricular size is normal. No increase in right ventricular wall thickness. Right ventricular systolic function is normal. There is normal pulmonary artery systolic pressure. The tricuspid regurgitant velocity is 2.16 m/s, and  with an assumed right atrial pressure of 8 mmHg, the estimated right ventricular systolic pressure is 89.3 mmHg. Left Atrium: Left atrial size was normal in size. Right Atrium: Right atrial size was moderately dilated. Pericardium: There is no evidence of pericardial effusion. Mitral Valve: The mitral valve is normal in structure. Normal mobility of the mitral valve leaflets. No evidence of mitral valve regurgitation. No evidence of mitral valve stenosis. Tricuspid Valve: The tricuspid valve is normal in  structure. Tricuspid valve regurgitation is trivial. No evidence of tricuspid stenosis. Aortic Valve: The aortic valve is tricuspid. Aortic valve regurgitation is not visualized. Mild aortic valve sclerosis is present, with no evidence of aortic valve stenosis. Aortic valve mean gradient measures 7.5 mmHg. Aortic valve peak gradient measures 12.9 mmHg. Aortic valve area, by VTI measures 1.69 cm. Pulmonic Valve: The pulmonic valve was normal in structure. Pulmonic valve regurgitation is not visualized. No evidence of pulmonic stenosis. Aorta: The aortic root is normal in size and structure. Venous: The inferior vena cava is dilated in size with greater than 50% respiratory variability, suggesting right atrial pressure of 8 mmHg. IAS/Shunts: The interatrial septum appears to be lipomatous. No atrial level shunt detected by color flow Doppler.  LEFT VENTRICLE PLAX 2D LVIDd:         3.90 cm LVIDs:         2.70 cm LV PW:         1.40 cm LV IVS:        1.30 cm LVOT diam:     2.00 cm LV SV:         61 LV SV Index:   29 LVOT Area:     3.14 cm  RIGHT VENTRICLE TAPSE (M-mode): 1.9 cm LEFT ATRIUM  Index       RIGHT ATRIUM           Index LA diam:        3.90 cm 1.84 cm/m  RA Area:     27.00 cm LA Vol (A2C):   64.8 ml 30.53 ml/m RA Volume:   94.60 ml  44.58 ml/m LA Vol (A4C):   53.4 ml 25.16 ml/m LA Biplane Vol: 63.7 ml 30.02 ml/m  AORTIC VALVE AV Area (Vmax):    1.68 cm AV Area (Vmean):   1.72 cm AV Area (VTI):     1.69 cm AV Vmax:           179.50 cm/s AV Vmean:          131.000 cm/s AV VTI:            0.364 m AV Peak Grad:      12.9 mmHg AV Mean Grad:      7.5 mmHg LVOT Vmax:         96.00 cm/s LVOT Vmean:        71.600 cm/s LVOT VTI:          0.195 m LVOT/AV VTI ratio: 0.54  AORTA Ao Root diam: 2.80 cm MITRAL VALVE                TRICUSPID VALVE MV Area (PHT): 3.53 cm     TR Peak grad:   18.7 mmHg MV Decel Time: 215 msec     TR Vmax:        216.00 cm/s MV E velocity: 120.67 cm/s                              SHUNTS                             Systemic VTI:  0.20 m                             Systemic Diam: 2.00 cm Fransico Him MD Electronically signed by Fransico Him MD Signature Date/Time: 08/13/2019/3:54:40 PM    Final       Subjective: Seen and examined at bedside, resting comfortably.  Husband present at bedside.  Ready for discharge home.  No other complaints at this moment.  Urinary symptoms have resolved after initiation of antibiotics overnight.  Denies headache, no fever/chills/night sweats, no nausea/vomiting/diarrhea, no chest pain, no palpitations, no shortness of breath, no abdominal pain, no weakness, no fatigue, no paresthesias.  No acute events overnight per nursing staff.  Discharge Exam: Vitals:   08/16/19 0811 08/16/19 0820  BP: (!) 155/58   Pulse: 71   Resp: 20   Temp: (!) 97.4 F (36.3 C)   SpO2: 94% 94%   Vitals:   08/15/19 2029 08/16/19 0505 08/16/19 0811 08/16/19 0820  BP: (!) 165/62 (!) 177/70 (!) 155/58   Pulse: 63 66 71   Resp: 18 18 20    Temp: 98.3 F (36.8 C) 98 F (36.7 C) (!) 97.4 F (36.3 C)   TempSrc: Oral Oral Oral   SpO2: 94% 95% 94% 94%  Weight:  103.9 kg    Height:        General: Pt is alert, awake, not in acute distress Cardiovascular: RRR, S1/S2 +, no rubs, no gallops Respiratory: CTA bilaterally, no wheezing, no rhonchi, oxygenating well on room  air. Abdominal: Soft, NT, ND, bowel sounds + Extremities: no edema, no cyanosis    The results of significant diagnostics from this hospitalization (including imaging, microbiology, ancillary and laboratory) are listed below for reference.     Microbiology: Recent Results (from the past 240 hour(s))  SARS CORONAVIRUS 2 (TAT 6-24 HRS) Nasopharyngeal Nasopharyngeal Swab     Status: None   Collection Time: 08/12/19 11:21 PM   Specimen: Nasopharyngeal Swab  Result Value Ref Range Status   SARS Coronavirus 2 NEGATIVE NEGATIVE Final    Comment: (NOTE) SARS-CoV-2 target nucleic acids are  NOT DETECTED. The SARS-CoV-2 RNA is generally detectable in upper and lower respiratory specimens during the acute phase of infection. Negative results do not preclude SARS-CoV-2 infection, do not rule out co-infections with other pathogens, and should not be used as the sole basis for treatment or other patient management decisions. Negative results must be combined with clinical observations, patient history, and epidemiological information. The expected result is Negative. Fact Sheet for Patients: SugarRoll.be Fact Sheet for Healthcare Providers: https://www.woods-mathews.com/ This test is not yet approved or cleared by the [place] FDA and  has been authorized for detection and/or diagnosis of SARS-CoV-2 by FDA under an Emergency Use Authorization (EUA). This EUA will remain  in effect (meaning this test can be used) for the duration of the COVID-19 declaration under Section 56 4(b)(1) of the Act, 21 U.S.C. section 360bbb-3(b)(1), unless the authorization is terminated or revoked sooner. Performed at [facility], [place] [address] [place], [place] [address]      Labs: BNP (last 3 results) Recent Labs    08/12/19 2333  BNP 222.9*   Basic Metabolic Panel: Recent Labs  Lab 08/12/19 2333 08/14/19 0555 08/15/19 0437 08/16/19 0453  NA 139 140 138 139  K 5.3* 4.0 3.8 4.2  CL 106 101 100 101  CO2 22 23 25 27   GLUCOSE 276* 85 204* 174*  BUN 33* 35* 49* 47*  CREATININE 1.27* 1.26* 1.45* 1.35*  CALCIUM 9.4 9.4 8.6* 8.9   Liver Function Tests: Recent Labs  Lab 08/12/19 2333 08/14/19 0555 08/15/19 0437 08/16/19 0453  AST 20 21 20 21   ALT 17 16 15 16   ALKPHOS 67 60 64 67  BILITOT 0.5 0.2* 0.5 0.3  PROT 7.0 7.1 6.6 6.7  ALBUMIN 3.1* 3.1* 2.6* 2.6*   No results for input(s): LIPASE, AMYLASE in the last 168 hours. No results for input(s): AMMONIA in the last 168 hours. CBC: Recent Labs  Lab 08/12/19 2333  08/13/19 0551 08/14/19 0555 08/15/19 0437 08/16/19 0453  WBC 17.2* 16.3* 15.4* 11.9* 11.6*  NEUTROABS 14.9*  --  10.4* 7.8* 7.5  HGB 9.5* 10.2* 10.6* 10.1* 10.4*  HCT 30.9* 34.2* 34.8* 32.6* 34.0*  MCV 93.6 95.3 92.3 91.6 91.9  PLT 431* 463* 498* 467* 467*   Cardiac Enzymes: No results for input(s): CKTOTAL, CKMB, CKMBINDEX, TROPONINI in the last 168 hours. BNP: Invalid input(s): POCBNP CBG: Recent Labs  Lab 08/15/19 0645 08/15/19 1104 08/15/19 1655 08/15/19 2110 08/16/19 0616  GLUCAP 164* 187* 192* 212* 146*   D-Dimer No results for input(s): DDIMER in the last 72 hours. Hgb A1c No results for input(s): HGBA1C in the last 72 hours. Lipid Profile No results for input(s): CHOL, HDL, LDLCALC, TRIG, CHOLHDL, LDLDIRECT in the last 72 hours. Thyroid function studies No results for input(s): TSH, T4TOTAL, T3FREE, THYROIDAB in the last 72 hours.  Invalid input(s): FREET3 Anemia work up No results for input(s): VITAMINB12, FOLATE, FERRITIN,  TIBC, IRON, RETICCTPCT in the last 72 hours. Urinalysis    Component Value Date/Time   COLORURINE YELLOW 08/15/2019 2257   APPEARANCEUR HAZY (A) 08/15/2019 2257   LABSPEC 1.014 08/15/2019 2257   PHURINE 5.0 08/15/2019 2257   GLUCOSEU NEGATIVE 08/15/2019 2257   GLUCOSEU NEGATIVE 12/09/2018 0944   HGBUR NEGATIVE 08/15/2019 2257   BILIRUBINUR NEGATIVE 08/15/2019 2257   KETONESUR NEGATIVE 08/15/2019 2257   PROTEINUR 100 (A) 08/15/2019 2257   UROBILINOGEN 0.2 12/09/2018 0944   NITRITE NEGATIVE 08/15/2019 2257   LEUKOCYTESUR MODERATE (A) 08/15/2019 2257   Sepsis Labs Invalid input(s): PROCALCITONIN,  WBC,  LACTICIDVEN Microbiology Recent Results (from the past 240 hour(s))  SARS CORONAVIRUS 2 (TAT 6-24 HRS) Nasopharyngeal Nasopharyngeal Swab     Status: None   Collection Time: 08/12/19 11:21 PM   Specimen: Nasopharyngeal Swab  Result Value Ref Range Status   SARS Coronavirus 2 NEGATIVE NEGATIVE Final    Comment: (NOTE) SARS-CoV-2  target nucleic acids are NOT DETECTED. The SARS-CoV-2 RNA is generally detectable in upper and lower respiratory specimens during the acute phase of infection. Negative results do not preclude SARS-CoV-2 infection, do not rule out co-infections with other pathogens, and should not be used as the sole basis for treatment or other patient management decisions. Negative results must be combined with clinical observations, patient history, and epidemiological information. The expected result is Negative. Fact Sheet for Patients: SugarRoll.be Fact Sheet for Healthcare Providers: https://www.woods-mathews.com/ This test is not yet approved or cleared by the [place] FDA and  has been authorized for detection and/or diagnosis of SARS-CoV-2 by FDA under an Emergency Use Authorization (EUA). This EUA will remain  in effect (meaning this test can be used) for the duration of the COVID-19 declaration under Section 56 4(b)(1) of the Act, 21 U.S.C. section 360bbb-3(b)(1), unless the authorization is terminated or revoked sooner. Performed at [facility], [place] [address], [place], [place] [address]      Time coordinating discharge: Over 30 minutes  SIGNED:   Hesston Hitchens J [place] DO  [facility] 08/16/2019, 8:48 AM

## 2019-08-16 NOTE — TOC Transition Note (Signed)
Transition of Care [facility]) - CM/SW Discharge Note   Patient Details  Name: Kayla [name] MRN: [id number] Date of Birth: 1941/01/11  Transition of Care [facility]) CM/SW Contact:  Zenon Mayo, RN Phone Number: 08/16/2019, 9:12 AM   Clinical Narrative:    NCM spoke with patient, she states she does not need HHRN or any DME , her spouse is here to transport her home.  She does not have any issues getting her medications.    Final next level of care: Home/Self Care Barriers to Discharge: No Barriers Identified   Patient Goals and CMS Choice Patient states their goals for this hospitalization and ongoing recovery are:: get better   Choice offered to / list presented to : NA  Discharge Placement                       Discharge Plan and Services                  DME Agency: NA                  Social Determinants of Health (SDOH) Interventions     Readmission Risk Interventions No flowsheet data found.

## 2019-08-18 LAB — URINE CULTURE: Culture: >=100000 — AB

## 2019-08-21 DIAGNOSIS — N390 Urinary tract infection, site not specified: Secondary | ICD-10-CM | POA: Diagnosis not present

## 2019-08-21 DIAGNOSIS — Z7689 Persons encountering health services in other specified circumstances: Secondary | ICD-10-CM | POA: Diagnosis not present

## 2019-08-21 DIAGNOSIS — Z79899 Other long term (current) drug therapy: Secondary | ICD-10-CM | POA: Diagnosis not present

## 2019-08-21 DIAGNOSIS — B9620 Unspecified Escherichia coli [E. coli] as the cause of diseases classified elsewhere: Secondary | ICD-10-CM | POA: Diagnosis not present

## 2019-08-21 DIAGNOSIS — N1832 Chronic kidney disease, stage 3b: Secondary | ICD-10-CM | POA: Diagnosis not present

## 2019-08-21 DIAGNOSIS — I509 Heart failure, unspecified: Secondary | ICD-10-CM | POA: Diagnosis not present

## 2019-08-21 DIAGNOSIS — Z6836 Body mass index (BMI) 36.0-36.9, adult: Secondary | ICD-10-CM | POA: Diagnosis not present

## 2019-08-23 DIAGNOSIS — E875 Hyperkalemia: Secondary | ICD-10-CM | POA: Diagnosis not present

## 2019-08-24 ENCOUNTER — Ambulatory Visit: Payer: Medicare HMO | Admitting: Podiatry

## 2019-08-24 ENCOUNTER — Encounter: Payer: Self-pay | Admitting: Podiatry

## 2019-08-24 ENCOUNTER — Other Ambulatory Visit: Payer: Self-pay

## 2019-08-24 VITALS — Temp 97.6°F

## 2019-08-24 DIAGNOSIS — L97522 Non-pressure chronic ulcer of other part of left foot with fat layer exposed: Secondary | ICD-10-CM | POA: Diagnosis not present

## 2019-08-24 DIAGNOSIS — M2012 Hallux valgus (acquired), left foot: Secondary | ICD-10-CM

## 2019-08-24 NOTE — Progress Notes (Signed)
  Subjective:  Patient ID: Kayla [name], female    DOB: 1940-09-01,  MRN: [id number]  Chief Complaint  Patient presents with  . Wound Check    L hallux follow up; "still bleeding all the time but think its getting better; no other concerns"    79 y.o. female presents for wound care. Hx confirmed with patient.  Objective:  Physical Exam: Wound Location: left hallux Wound Measurement: 1.5 x 0.4 Wound Base: Granular/Healthy Peri-wound: Calloused Exudate: Scant/small amount Serosanguinous exudate wound without warmth, erythema, signs of acute infection Hallux valgus deformity left  No images are attached to the encounter.   Assessment:   1. Skin ulcer of left great toe with fat layer exposed ([place])   2. Hallux valgus, left      Plan:  Patient was evaluated and treated and all questions answered.  Ulcer left foot -Dressing applied consisting of silvadene, sterile gauze and coban -Offload ulcer with surgical shoe -Wound cleansed and debrided  Procedure: Excisional Debridement of Wound Rationale: Removal of non-viable soft tissue from the wound to promote healing.  Anesthesia: none Pre-Debridement Wound Measurements: 1 cm x 0.3 cm x 0.2 cm  Post-Debridement Wound Measurements: 1.2 cm x 0.4 cm x 0.2 cm  Type of Debridement: Sharp Excisional Tissue Removed: Non-viable soft tissue Depth of Debridement: subcutaneous tissue. Technique: Sharp excisional debridement to bleeding, viable wound base.  Dressing: Dry, sterile, compression dressing. Disposition: Patient tolerated procedure well. Patient to return in 1 week for follow-up.  Hallux Valgus -Dispense tubefoam bunion shielf -Discussed with patient should ulcer persist consider HAV correction for curative pressure reduction. A1c slightly over 8  No follow-ups on file.

## 2019-08-28 ENCOUNTER — Telehealth: Payer: Self-pay | Admitting: Pulmonary Disease

## 2019-08-28 MED ORDER — INCRUSE ELLIPTA 62.5 MCG/INH IN AEPB: 1.0000 | INHALATION_SPRAY | Freq: Every day | RESPIRATORY_TRACT | 3 refills | Status: DC

## 2019-08-28 NOTE — Telephone Encounter (Signed)
Attempted to call pt but unable to reach. Left message for pt to return call. 

## 2019-08-28 NOTE — Telephone Encounter (Signed)
Patient returning call.  [phone] for about an hour.  [phone] after 5.  Incruse needs to be refilled. States she is about to run out of medication.

## 2019-08-28 NOTE — Telephone Encounter (Signed)
Refill of incruse sent to pt's preferred pharmacy. Called and spoke with pt letting her know this had been done and she verbalized understanding. Nothing further needed.

## 2019-08-29 DIAGNOSIS — G4733 Obstructive sleep apnea (adult) (pediatric): Secondary | ICD-10-CM | POA: Diagnosis not present

## 2019-08-29 DIAGNOSIS — R05 Cough: Secondary | ICD-10-CM | POA: Diagnosis not present

## 2019-08-29 DIAGNOSIS — J449 Chronic obstructive pulmonary disease, unspecified: Secondary | ICD-10-CM | POA: Diagnosis not present

## 2019-09-06 ENCOUNTER — Telehealth: Payer: Self-pay | Admitting: Pulmonary Disease

## 2019-09-06 NOTE — Telephone Encounter (Signed)
Spoke with the pt and she states that her incruse is not covered    I called [employer] and was advised that they prefer spiriva respimat or handihaler  I do not see that the pt has tried either of these   Other option is to try for PA by calling [phone] pt ID number [id number]   Please advise what you prefer to do, thanks!

## 2019-09-07 MED ORDER — SPIRIVA RESPIMAT 2.5 MCG/ACT IN AERS: 2.0000 | INHALATION_SPRAY | Freq: Every day | RESPIRATORY_TRACT | 0 refills | Status: DC

## 2019-09-07 NOTE — Telephone Encounter (Signed)
Spoke with pt. She is aware of the medication change. Rx has been sent to [facility]. Samples have also been left at the front for pt to pick up since she is out of medication completely. Nothing further was needed.

## 2019-09-07 NOTE — Telephone Encounter (Signed)
Please send order for spiriva respimat 2 puffs daily.   Please let her know this changed is required by her insurance because incruse is not on her insurance formulary.

## 2019-09-07 NOTE — Telephone Encounter (Signed)
Pt called about this again-- please call back before noon if possible.

## 2019-09-14 ENCOUNTER — Other Ambulatory Visit: Payer: Self-pay

## 2019-09-14 ENCOUNTER — Ambulatory Visit: Payer: Medicare HMO | Admitting: Podiatry

## 2019-09-14 DIAGNOSIS — L97522 Non-pressure chronic ulcer of other part of left foot with fat layer exposed: Secondary | ICD-10-CM | POA: Diagnosis not present

## 2019-09-21 ENCOUNTER — Ambulatory Visit (INDEPENDENT_AMBULATORY_CARE_PROVIDER_SITE_OTHER): Payer: Medicare HMO | Admitting: *Deleted

## 2019-09-21 ENCOUNTER — Ambulatory Visit: Payer: Medicare HMO | Admitting: Podiatry

## 2019-09-21 DIAGNOSIS — I495 Sick sinus syndrome: Secondary | ICD-10-CM | POA: Diagnosis not present

## 2019-09-22 ENCOUNTER — Telehealth: Payer: Self-pay

## 2019-09-22 LAB — CUP PACEART REMOTE DEVICE CHECK
Battery Remaining Longevity: 66 mo
Battery Voltage: 3 V
Brady Statistic AP VP Percent: 0.22 %
Brady Statistic AP VS Percent: 59.51 %
Brady Statistic AS VP Percent: 15.62 %
Brady Statistic AS VS Percent: 24.66 %
Brady Statistic RA Percent Paced: 48.02 %
Brady Statistic RV Percent Paced: 16.15 %
Date Time Interrogation Session: 20210521122717
Implantable Lead Implant Date: 20170707
Implantable Lead Implant Date: 20170707
Implantable Lead Location: 753859
Implantable Lead Location: 753860
Implantable Lead Model: 3830
Implantable Lead Model: 5076
Implantable Pulse Generator Implant Date: 20170707
Lead Channel Impedance Value: 285 Ohm
Lead Channel Impedance Value: 361 Ohm
Lead Channel Impedance Value: 380 Ohm
Lead Channel Impedance Value: 399 Ohm
Lead Channel Pacing Threshold Amplitude: 1 V
Lead Channel Pacing Threshold Amplitude: 1.875 V
Lead Channel Pacing Threshold Pulse Width: 0.4 ms
Lead Channel Pacing Threshold Pulse Width: 0.4 ms
Lead Channel Sensing Intrinsic Amplitude: 4.625 mV
Lead Channel Sensing Intrinsic Amplitude: 4.625 mV
Lead Channel Sensing Intrinsic Amplitude: 6.375 mV
Lead Channel Sensing Intrinsic Amplitude: 6.375 mV
Lead Channel Setting Pacing Amplitude: 2 V
Lead Channel Setting Pacing Amplitude: 2.5 V
Lead Channel Setting Pacing Pulse Width: 1 ms
Lead Channel Setting Sensing Sensitivity: 2.8 mV

## 2019-09-22 NOTE — Telephone Encounter (Signed)
Left message for patient to remind of missed remote transmission.  

## 2019-09-25 NOTE — Progress Notes (Signed)
Remote pacemaker transmission.   

## 2019-10-02 ENCOUNTER — Other Ambulatory Visit: Payer: Self-pay

## 2019-10-02 ENCOUNTER — Emergency Department (HOSPITAL_COMMUNITY): Payer: Medicare HMO

## 2019-10-02 ENCOUNTER — Emergency Department (HOSPITAL_COMMUNITY)
Admission: EM | Admit: 2019-10-02 | Discharge: 2019-10-03 | Disposition: A | Discharge status: Home / Self Care | Payer: Medicare HMO | Attending: Emergency Medicine | Admitting: Emergency Medicine

## 2019-10-02 ENCOUNTER — Encounter (HOSPITAL_COMMUNITY): Payer: Self-pay | Admitting: Emergency Medicine

## 2019-10-02 DIAGNOSIS — Z7901 Long term (current) use of anticoagulants: Secondary | ICD-10-CM | POA: Insufficient documentation

## 2019-10-02 DIAGNOSIS — E039 Hypothyroidism, unspecified: Secondary | ICD-10-CM | POA: Diagnosis not present

## 2019-10-02 DIAGNOSIS — Z79899 Other long term (current) drug therapy: Secondary | ICD-10-CM | POA: Diagnosis not present

## 2019-10-02 DIAGNOSIS — J449 Chronic obstructive pulmonary disease, unspecified: Secondary | ICD-10-CM | POA: Diagnosis not present

## 2019-10-02 DIAGNOSIS — I2510 Atherosclerotic heart disease of native coronary artery without angina pectoris: Secondary | ICD-10-CM | POA: Insufficient documentation

## 2019-10-02 DIAGNOSIS — I509 Heart failure, unspecified: Secondary | ICD-10-CM

## 2019-10-02 DIAGNOSIS — I110 Hypertensive heart disease with heart failure: Secondary | ICD-10-CM | POA: Diagnosis not present

## 2019-10-02 DIAGNOSIS — E119 Type 2 diabetes mellitus without complications: Secondary | ICD-10-CM | POA: Insufficient documentation

## 2019-10-02 DIAGNOSIS — Z794 Long term (current) use of insulin: Secondary | ICD-10-CM | POA: Diagnosis not present

## 2019-10-02 DIAGNOSIS — Z87891 Personal history of nicotine dependence: Secondary | ICD-10-CM | POA: Diagnosis not present

## 2019-10-02 DIAGNOSIS — R0602 Shortness of breath: Secondary | ICD-10-CM | POA: Diagnosis not present

## 2019-10-02 HISTORY — DX: Heart failure, unspecified: I50.9

## 2019-10-02 LAB — BASIC METABOLIC PANEL
Anion gap: 15 (ref 5–15)
BUN: 31 mg/dL — ABNORMAL HIGH (ref 8–23)
CO2: 24 mmol/L (ref 22–32)
Calcium: 9.5 mg/dL (ref 8.9–10.3)
Chloride: 100 mmol/L (ref 98–111)
Creatinine, Ser: 1.36 mg/dL — ABNORMAL HIGH (ref 0.44–1.00)
GFR calc Af Amer: 43 mL/min — ABNORMAL LOW (ref >60)
GFR calc non Af Amer: 37 mL/min — ABNORMAL LOW (ref >60)
Glucose, Bld: 383 mg/dL — ABNORMAL HIGH (ref 70–99)
Potassium: 4.4 mmol/L (ref 3.5–5.1)
Sodium: 139 mmol/L (ref 135–145)

## 2019-10-02 LAB — PROTIME-INR
INR: 1.1 (ref 0.8–1.2)
Prothrombin Time: 13.5 seconds (ref 11.4–15.2)

## 2019-10-02 LAB — CBC
HCT: 34.1 % — ABNORMAL LOW (ref 36.0–46.0)
Hemoglobin: 10.4 g/dL — ABNORMAL LOW (ref 12.0–15.0)
MCH: 28.3 pg (ref 26.0–34.0)
MCHC: 30.5 g/dL (ref 30.0–36.0)
MCV: 92.9 fL (ref 80.0–100.0)
Platelets: 409 10*3/uL — ABNORMAL HIGH (ref 150–400)
RBC: 3.67 MIL/uL — ABNORMAL LOW (ref 3.87–5.11)
RDW: 15.8 % — ABNORMAL HIGH (ref 11.5–15.5)
WBC: 11.5 10*3/uL — ABNORMAL HIGH (ref 4.0–10.5)
nRBC: 0 % (ref 0.0–0.2)

## 2019-10-02 LAB — TROPONIN I (HIGH SENSITIVITY): Troponin I (High Sensitivity): 11 ng/L (ref <18)

## 2019-10-02 MED ORDER — SODIUM CHLORIDE 0.9% FLUSH: 3.0000 mL | Freq: Once | INTRAVENOUS | Status: DC

## 2019-10-02 NOTE — ED Triage Notes (Signed)
Patient reports SOB worse with exertion onset today with chest tightness and dry cough . Denies fever or chills .

## 2019-10-03 LAB — TROPONIN I (HIGH SENSITIVITY): Troponin I (High Sensitivity): 11 ng/L (ref <18)

## 2019-10-03 LAB — BRAIN NATRIURETIC PEPTIDE: B Natriuretic Peptide: 69.3 pg/mL (ref 0.0–100.0)

## 2019-10-03 MED ORDER — FUROSEMIDE 10 MG/ML IJ SOLN
40.0000 mg | INTRAMUSCULAR | Status: AC
  Administered 2019-10-03: 40 mg via INTRAVENOUS
  Filled 2019-10-03: qty 4

## 2019-10-03 NOTE — ED Provider Notes (Signed)
[facility] EMERGENCY DEPARTMENT Provider Note   CSN: [id number] Arrival date & time: 10/02/19  2146     History Chief Complaint  Patient presents with  . Shortness of Breath    Kayla [name] is a 79 y.o. female.  79yo F w/ PMH including CHF, CAD, COPD, OSA, T2DM who p/w SOB. Pt reports she always feels SOB due to COPD but has had 2 days of progressively worsening SOB that is worse w/ exertion and worse laying flat. When she feels more SOB, she has some associated chest tightness. Just walking to bathroom or bedroom gets her short of breath. She reports increased swelling in ankles and 8 lb weight gain today despite compliance w/ lasix 40mg  BID.  She reports chronic dry cough but no other URI symptoms.  These symptoms feel very similar to symptoms she had last month for which she was hospitalized and treated for CHF exacerbation with IV Lasix.  The history is provided by the patient.  Shortness of Breath      Past Medical History:  Diagnosis Date  . Allergy   . Anxiety   . Arthritis   . CAD (coronary artery disease)    Mild per remote cath in 2006, /    Nuclear, January, 2013, no significant abnormality,  . Carotid artery disease ([place])    Doppler, January, 2013, 0 25% RIC A., 42-70% LICA, stable  . Cataract   . CHF (congestive heart failure) ([place])   . Complication of anesthesia    wakes up "mean"  . COPD 02/16/2007   Intolerant of Spiriva, tudorza Intolerant of Trelegy   . COPD (chronic obstructive pulmonary disease) ([place])   . Coronary atherosclerosis 11/24/2008   Catheterization 2006, nonobstructive coronary disease   //   nuclear 2013, low risk    . Diabetes mellitus, type 2 ([place])   . Diverticula of colon   . DIVERTICULOSIS OF COLON 03/23/2007   Qualifier: Diagnosis of  By: Halford Chessman MD, Vineet    . Dizziness    occasional  . Dizzy spells 05/04/2011  . DM (diabetes mellitus) ([place]) 03/23/2007   Qualifier: Diagnosis of  By: Halford Chessman MD, Vineet    .  Ejection fraction   . Essential hypertension 02/16/2007   Qualifier: Diagnosis of  By: Rosana Hoes CMA, Tammy    . G E R D 03/23/2007   Qualifier: Diagnosis of  By: Halford Chessman MD, Vineet    . Gall bladder disease 04/28/2016  . Gastritis and gastroduodenitis   . GERD (gastroesophageal reflux disease)   . Goiter    hx of  . Headache(784.0)    migraine  . Hyperlipidemia   . Hypertension   . Hypothyroidism   . HYPOTHYROIDISM, POSTSURGICAL 06/04/2008   Qualifier: Diagnosis of  By: Loanne Drilling MD, Jacelyn Pi   . Left ventricular hypertrophy   . Leg edema    occasional swelling  . Leg swelling 12/09/2018  . Myofascial pain syndrome, cervical 01/06/2018  . Obesity   . OBESITY 11/24/2008   Qualifier: Diagnosis of  By: Sidney Ace    . OBSTRUCTIVE SLEEP APNEA 02/16/2007   Auto CPAP 09/03/13 to 10/02/13 >> used on 30 of 30 nights with average 6 hrs and 3 min.  Average AHI is 3.1 with median CPAP 5 cm H2O and 95 th percentile CPAP 6 cm H2O.    . Occipital neuralgia of right side 01/06/2018  . OSA (obstructive sleep apnea)    cpap setting of 12  . Pain in joint of  right hip 01/21/2018  . Palpitations 01/21/2011   48 hour Holter, 2015, scattered PACs and PVCs.   Marland Kitchen PONV (postoperative nausea and vomiting)    severe after every surgery  . Pulmonary hypertension, secondary   . RHINITIS 07/21/2010       . RUQ abdominal pain   . S/P left TKA 07/27/2011  . Sinus node dysfunction ([place]) 11/08/2015  . Sleep apnea   . Spinal stenosis of cervical region 11/07/2014  . Urinary frequency 12/09/2018  . UTI (urinary tract infection) 08/15/2019   per pt report  . VENTRICULAR HYPERTROPHY, LEFT 11/24/2008   Qualifier: Diagnosis of  By: Sidney Ace      Patient Active Problem List   Diagnosis Date Noted  . Acute exacerbation of CHF (congestive heart failure) ([place]) 08/13/2019  . COPD with acute exacerbation ([place]) 08/13/2019  . Acute respiratory failure ([place]) 08/13/2019  . Chest pain 08/13/2019  . Leukocytosis 08/13/2019  . Leg  swelling 12/09/2018  . Urinary frequency 12/09/2018  . Pain in joint of right hip 01/21/2018  . Myofascial pain syndrome, cervical 01/06/2018  . Occipital neuralgia of right side 01/06/2018  . RUQ abdominal pain   . Gastritis and gastroduodenitis   . Bile duct abnormality   . Cholecystitis 04/30/2016  . Gall bladder disease 04/28/2016  . Sinus node dysfunction ([place]) 11/08/2015  . Ejection fraction   . Spinal stenosis of cervical region 11/07/2014  . Preoperative clearance 10/09/2014  . Carotid artery disease ([place])   . S/P left TKA 07/27/2011  . Dizzy spells 05/04/2011  . Palpitations 01/21/2011  . RHINITIS 07/21/2010  . Hyperlipidemia 11/24/2008  . OBESITY 11/24/2008  . Coronary atherosclerosis 11/24/2008  . VENTRICULAR HYPERTROPHY, LEFT 11/24/2008  . HYPOTHYROIDISM, POSTSURGICAL 06/04/2008  . Headache 06/04/2008  . DM (diabetes mellitus) ([place]) 03/23/2007  . G E R D 03/23/2007  . DIVERTICULOSIS OF COLON 03/23/2007  . GENERALIZED OSTEOARTHROSIS UNSPECIFIED SITE 03/23/2007  . OBSTRUCTIVE SLEEP APNEA 02/16/2007  . Essential hypertension 02/16/2007  . COPD 02/16/2007    Past Surgical History:  Procedure Laterality Date  . ABDOMINAL HYSTERECTOMY     with appendectomy and colon polypectomy  . ANTERIOR CERVICAL DECOMP/DISCECTOMY FUSION N/A 11/07/2014   Procedure: Anterior Cervical diskectomy and fusion Cervical four-five, Cervical five-six, Cervical six-seven;  Surgeon: Kary Kos, MD;  Location: [facility];  Service: Neurosurgery;  Laterality: N/A;  . APPENDECTOMY    . ARTERY BIOPSY Right 02/16/2017   Procedure: BIOPSY TEMPORAL ARTERY;  Surgeon: Katha Cabal, MD;  Location: [facility] ORS;  Service: Vascular;  Laterality: Right;  . BACK SURGERY    . CARDIAC CATHETERIZATION    . CATARACT EXTRACTION W/ INTRAOCULAR LENS  IMPLANT, BILATERAL Bilateral   . CHOLECYSTECTOMY N/A 04/29/2016   Procedure: LAPAROSCOPIC CHOLECYSTECTOMY WITH INTRAOPERATIVE CHOLANGIOGRAM;  Surgeon: Alphonsa Overall, MD;  Location: [facility];  Service: General;  Laterality: N/A;  . EP IMPLANTABLE DEVICE N/A 11/08/2015   Procedure: Pacemaker Implant;  Surgeon: Deboraha Sprang, MD;  Location: [facility];  Service: Cardiovascular;  Laterality: N/A;  . INSERT / REPLACE / REMOVE PACEMAKER  2017   Dr. Jens Som [facility])  . JOINT REPLACEMENT    . LUMBAR DISC SURGERY  1990's   x 2  . THYROIDECTOMY  2011  . TOTAL KNEE ARTHROPLASTY  07/27/2011   Procedure: TOTAL KNEE ARTHROPLASTY;  Surgeon: Mauri Pole, MD;  Location: [facility];  Service: Orthopedics;  Laterality: Left;  . UPPER ESOPHAGEAL ENDOSCOPIC ULTRASOUND (EUS) N/A 05/14/2016   Procedure: UPPER  ESOPHAGEAL ENDOSCOPIC ULTRASOUND (EUS);  Surgeon: Milus Banister, MD;  Location: [facility];  Service: Endoscopy;  Laterality: N/A;  radial only-will be done mod if needed     OB History   No obstetric history on file.     Family History  Problem Relation Age of Onset  . Heart Problems Mother   . Heart defect Mother        valve problems  . Heart Problems Father   . Heart disease Brother   . Lung cancer Daughter        non small cell   . Diabetes Maternal Aunt   . Diabetes Maternal Uncle   . Throat cancer Brother   . Colon cancer Neg Hx   . Colon polyps Neg Hx   . Esophageal cancer Neg Hx   . Rectal cancer Neg Hx   . Stomach cancer Neg Hx     Social History   Tobacco Use  . Smoking status: Former Smoker    Packs/day: 2.00    Years: 33.00    Pack years: 66.00    Types: Cigarettes    Start date: 1966    Quit date: 05/04/1997    Years since quitting: 22.4  . Smokeless tobacco: Never Used  Substance Use Topics  . Alcohol use: No  . Drug use: No    Home Medications Prior to Admission medications   Medication Sig Start Date End Date Taking? Authorizing Provider  acetaminophen (TYLENOL) 500 MG tablet Take 1,000 mg by mouth every 6 (six) hours as needed for moderate pain (for headaches).    Yes [provider]  albuterol  (PROVENTIL) (2.5 MG/3ML) 0.083% nebulizer solution Take 2.5 mg by nebulization every 2 (two) hours as needed for wheezing or shortness of breath.    Yes [provider]  albuterol (VENTOLIN HFA) 108 (90 Base) MCG/ACT inhaler Inhale 2 puffs into the lungs every 6 (six) hours as needed for wheezing or shortness of breath. 08/09/17  Yes Chesley Mires, MD  allopurinol (ZYLOPRIM) 100 MG tablet Take 100 mg by mouth 2 (two) times daily.  04/16/19  Yes [provider]  amLODipine (NORVASC) 5 MG tablet Take 1 tablet (5 mg total) by mouth daily. 04/13/19  Yes Jerline Pain, MD  apixaban (ELIQUIS) 5 MG TABS tablet Take 1 tablet (5 mg total) by mouth 2 (two) times daily. 11/14/18  Yes Fenton, Clint R, PA  atorvastatin (LIPITOR) 40 MG tablet Take 1 tablet (40 mg total) by mouth daily. Patient taking differently: Take 40 mg by mouth at bedtime.  12/20/18 10/02/28 Yes Tillery, Satira Mccallum, PA-C  budesonide-formoterol [facility]) 160-4.5 MCG/ACT inhaler Inhale 2 puffs into the lungs 2 (two) times daily for 1 day. Patient taking differently: Inhale 2 puffs into the lungs in the morning and at bedtime.  12/15/18 10/02/28 Yes Martyn Ehrich, NP  Cholecalciferol (VITAMIN D-3 PO) Take 1 capsule by mouth daily with breakfast.   Yes [provider]  Dextromethorphan-guaiFENesin (TUSSIN DM MAX SUGAR-FREE PO) Take 5 mLs by mouth every 4 (four) hours as needed (for coughing).   Yes [provider]  fexofenadine (ALLEGRA) 180 MG tablet Take 180 mg by mouth daily as needed for allergies or rhinitis.   Yes [provider]  fluticasone (FLONASE) 50 MCG/ACT nasal spray USE 2 SPRAYS NASALLY DAILY Patient taking differently: Place 2 sprays into both nostrils daily.  02/26/17  Yes Collene Gobble, MD  furosemide (LASIX) 40 MG tablet Take 1 tablet (40 mg total)  by mouth 2 (two) times daily. 08/16/19 10/02/28 Yes [place] Eric J, DO  gabapentin (NEURONTIN) 800 MG tablet Take 800 mg by mouth See  admin instructions. Take 800 mg by mouth three to four times a day 09/20/17  Yes [provider]  HYDROcodone-acetaminophen (NORCO/VICODIN) 5-325 MG tablet Take 1-2 tablets by mouth every 6 (six) hours as needed for moderate pain. Patient taking differently: Take 1 tablet by mouth See admin instructions. Take 1 tablet by mouth three times a day and an additional 1 tablet as needed for pain 04/30/16  Yes Meuth, Brooke A, PA-C  insulin aspart (NOVOLOG FLEXPEN) 100 UNIT/ML FlexPen Inject 8-13 Units into the skin See admin instructions. Inject 8-13 units into the skin three times a day with meals, per sliding scale (and an additional 1 unit for every 50 points for a BGL >130)   Yes [provider]  insulin glargine (LANTUS) 100 UNIT/ML injection Inject 130 Units into the skin in the morning and at bedtime.    Yes [provider]  levothyroxine (SYNTHROID) 175 MCG tablet Take 175 mcg by mouth daily before breakfast.  02/24/19  Yes [provider]  LORazepam (ATIVAN) 2 MG tablet Take 2 mg by mouth at bedtime as needed for anxiety.  08/20/14  Yes [provider]  losartan (COZAAR) 100 MG tablet Take 100 mg by mouth daily.  06/21/17  Yes [provider]  metoprolol tartrate (LOPRESSOR) 50 MG tablet Take 50 mg by mouth in the morning and at bedtime.   Yes [provider]  nitroGLYCERIN (NITROSTAT) 0.4 MG SL tablet Place 1 tablet (0.4 mg total) under the tongue every 5 (five) minutes as needed for chest pain. 04/13/19  Yes Jerline Pain, MD  promethazine (PHENADOZ) 25 MG suppository Place 25 mg rectally daily as needed (for headaches).    Yes [provider]  silver sulfADIAZINE (SILVADENE) 1 % cream Apply pea-sized amount to wound daily. 07/05/19  Yes Evelina Bucy, DPM  spironolactone (ALDACTONE) 25 MG tablet Take 0.5 tablets (12.5 mg total) by mouth daily. 08/16/19 10/02/28 Yes [place] Eric J, DO  Tiotropium Bromide Monohydrate (SPIRIVA  RESPIMAT) 2.5 MCG/ACT AERS Inhale 2 puffs into the lungs daily. 09/07/19  Yes Chesley Mires, MD  vitamin B-12 (CYANOCOBALAMIN) 1000 MCG tablet Take 1,000 mcg by mouth daily.   Yes [provider]  PRESCRIPTION MEDICATION CPAP: At bedtime and "as needed for shortness of breath"    [provider]  Tiotropium Bromide Monohydrate (SPIRIVA RESPIMAT) 2.5 MCG/ACT AERS Inhale 2 puffs into the lungs daily. Patient not taking: Reported on 10/03/2019 09/07/19   Chesley Mires, MD  TRUE METRIX BLOOD GLUCOSE TEST test strip  09/18/17   [provider]    Allergies    Trulicity [dulaglutide]  Review of Systems   Review of Systems  Respiratory: Positive for shortness of breath.    All other systems reviewed and are negative except that which was mentioned in HPI  Physical Exam Updated Vital Signs BP (!) 136/59   Pulse 78   Temp 98.3 F (36.8 C) (Oral)   Resp 18   Ht 5\' 6"  (1.676 m)   Wt 108.4 kg   SpO2 95%   BMI 38.58 kg/m   Physical Exam Vitals and nursing note reviewed.  Constitutional:      General: She is not in acute distress.    Appearance: She is well-developed.  HENT:     Head: Normocephalic and atraumatic.  Eyes:  Conjunctiva/sclera: Conjunctivae normal.  Cardiovascular:     Rate and Rhythm: Normal rate. Rhythm irregular.     Heart sounds: Normal heart sounds. No murmur.  Pulmonary:     Effort: Pulmonary effort is normal.     Comments: Diminished breath sounds b/l, no wheezing Abdominal:     General: Bowel sounds are normal. There is no distension.     Palpations: Abdomen is soft.     Tenderness: There is no abdominal tenderness.  Musculoskeletal:     Cervical back: Neck supple.     Right lower leg: Edema present.     Left lower leg: Edema present.     Comments: 1+ pitting edema BLE to ankles  Skin:    General: Skin is warm and dry.  Neurological:     Mental Status: She is alert and oriented to person, place, and time.     Comments: Fluent  speech  Psychiatric:        Mood and Affect: Mood normal.        Judgment: Judgment normal.     ED Results / Procedures / Treatments   Labs (all labs ordered are listed, but only abnormal results are displayed) Labs Reviewed  BASIC METABOLIC PANEL - Abnormal; Notable for the following components:      Result Value   Glucose, Bld 383 (*)    BUN 31 (*)    Creatinine, Ser 1.36 (*)    GFR calc non Af Amer 37 (*)    GFR calc Af Amer 43 (*)    All other components within normal limits  CBC - Abnormal; Notable for the following components:   WBC 11.5 (*)    RBC 3.67 (*)    Hemoglobin 10.4 (*)    HCT 34.1 (*)    RDW 15.8 (*)    Platelets 409 (*)    All other components within normal limits  PROTIME-INR  BRAIN NATRIURETIC PEPTIDE  TROPONIN I (HIGH SENSITIVITY)  TROPONIN I (HIGH SENSITIVITY)    EKG EKG Interpretation  Date/Time:  Monday Oct 02 2019 21:58:31 EDT Ventricular Rate:  103 PR Interval:    QRS Duration: 76 QT Interval:  348 QTC Calculation: 455 R Axis:   42 Text Interpretation: Atrial fibrillation with rapid ventricular response Abnormal ECG A fib with RVR, rate faster than previous Confirmed by Theotis Burrow [phone]) on 10/03/2019 12:53:10 AM   Radiology DG Chest 2 View  Result Date: 10/02/2019 CLINICAL DATA:  Shortness of breath EXAM: CHEST - 2 VIEW COMPARISON:  08/14/2019 FINDINGS: Cardiac shadow is within normal limits. Aortic calcifications are seen. Pacing device is noted and stable. Postsurgical changes in the cervical spine are seen. The lungs are hyperinflated without focal infiltrate or effusion. No acute bony abnormality is noted. IMPRESSION: COPD without acute abnormality. Electronically Signed   By: Inez Catalina M.D.   On: 10/02/2019 22:31    Procedures Procedures (including critical care time)  Medications Ordered in ED Medications  sodium chloride flush (NS) 0.9 % injection 3 mL (3 mLs Intravenous Not Given 10/03/19 0052)  furosemide (LASIX)  injection 40 mg (40 mg Intravenous Given 10/03/19 0136)    ED Course  I have reviewed the triage vital signs and the nursing notes.  Pertinent labs & imaging results that were available during my care of the patient were reviewed by me and considered in my medical decision making (see chart for details).    MDM Rules/Calculators/A&P  PT alert, no resp distress, 97% on RA. Sx suggest volume overload/CHF esp given they are the same symptoms which led to hospitalization last month. She admits that she did not wait as long to come in this time. Cr at baseline, 1.36 today, CBC stable from previous, trop negative. CXR negative acute. BNP today is 69. She has no wheezing on exam to suggest COPD exacerbation. Given weight gain, leg swelling, DOE and orthopnea, recommended brief increase in lasix w/ 1 dose IV here, then 3 days of 80mg  in the morning, 40mg  in the evening, then return to normal regimen. She has been able to ambulate here with no drop in O2 sats. She is breathing comfortably on reassessment. Instructed to f/u with cardiology and extensively reviewed return precautions. She voiced understanding.  Final Clinical Impression(s) / ED Diagnoses Final diagnoses:  Shortness of breath  Congestive heart failure, unspecified HF chronicity, unspecified heart failure type [facility])    Rx / DC Orders ED Discharge Orders    None       Zsazsa Bahena, Wenda Over[place], MD 10/03/19 [phone]

## 2019-10-03 NOTE — ED Notes (Signed)
Pt ambulated with oximetry  Oxygen went from 93 to 97

## 2019-10-03 NOTE — Discharge Instructions (Signed)
Take 80mg  lasix (2 pills) in the morning and 40mg  lasix (1 pill) at night for 3 days. Then return to your usual home regimen of 40mg  in the morning and 40mg  at night. Follow up with cardiology.  Keep checking daily weights and avoiding salt in your diet.  Return to the ER if any of your symptoms worsen.

## 2019-10-04 ENCOUNTER — Other Ambulatory Visit: Payer: Self-pay

## 2019-10-04 ENCOUNTER — Ambulatory Visit: Payer: Medicare HMO | Admitting: Pulmonary Disease

## 2019-10-04 ENCOUNTER — Telehealth: Payer: Self-pay | Admitting: Cardiology

## 2019-10-04 ENCOUNTER — Encounter: Payer: Self-pay | Admitting: Pulmonary Disease

## 2019-10-04 VITALS — BP 124/78 | HR 78 | Temp 98.2°F | Ht 66.0 in | Wt 236.0 lb

## 2019-10-04 DIAGNOSIS — G4733 Obstructive sleep apnea (adult) (pediatric): Secondary | ICD-10-CM | POA: Diagnosis not present

## 2019-10-04 DIAGNOSIS — Z9989 Dependence on other enabling machines and devices: Secondary | ICD-10-CM

## 2019-10-04 DIAGNOSIS — J449 Chronic obstructive pulmonary disease, unspecified: Secondary | ICD-10-CM

## 2019-10-04 MED ORDER — BREZTRI AEROSPHERE 160-9-4.8 MCG/ACT IN AERO: 2.0000 | INHALATION_SPRAY | Freq: Two times a day (BID) | RESPIRATORY_TRACT | 0 refills | Status: DC

## 2019-10-04 NOTE — Addendum Note (Signed)
Addended by: Satira Sark D on: 10/04/2019 04:40 PM   Modules accepted: Orders

## 2019-10-04 NOTE — Patient Instructions (Signed)
Try using sample of breztri two puffs twice per day, and rinse mouth after each use  Don't use spiriva and symbicort while using breztri  Call after breztri sample completed and let us know if you would like a prescription for this  Follow up in 6 months

## 2019-10-04 NOTE — Telephone Encounter (Signed)
New Message   Patient is calling because she was in the hospital 5/31 and discharged 6/1. She was advised to schedule with [facility]. Her main concern is the swelling in her legs, feet, ankles and handles. As well as concern that her CHF is becoming worse. I did schedule her to see Kathyrn Drown on 6/8 and did not cancel the appt with [facility].  At first she was complaining of SOB, but then later on she said that may have to do with her COPD.

## 2019-10-04 NOTE — Progress Notes (Signed)
[facility]  Chief Complaint  Patient presents with  . Follow-up    pt states congestion.pt states coughing up mucus yellow in color and sob.pt states using rescue inhaler 2-3 times daily    Constitutional:  BP 124/78 (BP Location: Left Arm, Cuff Size: Normal)   Pulse 78   Temp 98.2 F (36.8 C) (Oral)   Ht 5\' 6"  (1.676 m)   Wt 236 lb (107 kg)   SpO2 99%   BMI 38.09 kg/m   Past Medical History:  Hypothyroidism s/p thyroidectomy, HTN, HLD, HA, GERD, Diverticulosis, DM, Cataract, CAD, OA, Anxiety, Allergies  Brief Summary:  Kayla [name] is a 79 y.o. female former smoker with COPD and obstructive sleep apnea.  Subjective:  Insurance wouldn't cover incruse.   Kayla [name] was changed to spiriva and using with symbicort.  Since this change Kayla [name] has noticed more trouble with dry cough.  Kayla [name] is not having wheeze or chest pain.  Kayla [name] tried trelegy before, but this made her feel nauseous.  Kayla [name] was in hospital recently for diastolic CHF.  Kayla [name] is followed by podiatry for chronic left toe infection.  Physical Exam:   Appearance - well kempt   ENMT - no sinus tenderness, no oral exudate, no LAN, Mallampati 4 airway, no stridor  Respiratory - equal breath sounds bilaterally, no wheezing or rales  CV - s1s2 regular rate and rhythm, no murmurs  Ext - no clubbing, no edema  Skin - no rashes  Psych - normal mood and affect   Assessment/Plan:   COPD with chronic bronchitis. - intolerant of trelegy and daliresp - did better with incruse, but wasn't covered by insurance - spiriva not as effective and has more cough since switching to this - will have her try sample of breztri in place of spiriva and symbicort; if this works better then Kayla [name] will call for a refill  Obstructive sleep apnea. - Kayla [name] is compliant with CPAP - continue auto CPAP  Obesity. - discussed importance of weight loss  A total of  25 minutes spent addressing patient care  issues on day of visit.   Follow up:   Patient Instructions  Try using sample of breztri two puffs twice per day, and rinse mouth after each use  Don't use spiriva and symbicort while using breztri  Call after breztri sample completed and let us know if you would like a prescription for this  Follow up in 6 months   Signature:  Chesley Mires, MD [place] Pager: [phone] 10/04/2019, 4:25 PM  Flow Sheet     Pulmonary tests:  Spirometry 10/19/08 >>FEV1 1.55 (68%), FEV1% 70  Spirometry 08/29/12 >> FEV1 1.38 (59%), FEV1% 58 FeNO 03/09/16 >> 7  Sleep tests:  PSG 02/2005 >>AHI 10  CPAP titration 02/19/14 >> CPAP 11 cm H2O >> AHI 0.7, PLMI 81.4, did not need supplemental oxygen Auto CPAP 09/03/19 to 10/02/19 >> used on 30 of 30 nights with average 9 hrs 58 min.  Average AHI 2.3 with median CPAP 7 and 95 th percentile CPAP 11 cm H2O  Cardiac tests:  Echo 08/13/19 >> EF 65 to 70%, mod LVH, mod RA dilation  Medications:   Allergies as of 10/04/2019      Reactions   Trulicity [dulaglutide] Nausea Only, Other (See Comments)   Made the patient sick to her stomach (only) several days after using it      Medication List       Accurate as of  October 04, 2019  4:25 PM. If you have any questions, ask your nurse or doctor.        acetaminophen 500 MG tablet Commonly known as: TYLENOL Take 1,000 mg by mouth every 6 (six) hours as needed for moderate pain (for headaches).   albuterol (2.5 MG/3ML) 0.083% nebulizer solution Commonly known as: PROVENTIL Take 2.5 mg by nebulization every 2 (two) hours as needed for wheezing or shortness of breath.   albuterol 108 (90 Base) MCG/ACT inhaler Commonly known as: Ventolin HFA Inhale 2 puffs into the lungs every 6 (six) hours as needed for wheezing or shortness of breath.   allopurinol 100 MG tablet Commonly known as: ZYLOPRIM Take 100 mg by mouth 2 (two) times daily.   amLODipine 5 MG tablet Commonly known as:  NORVASC Take 1 tablet (5 mg total) by mouth daily.   apixaban 5 MG Tabs tablet Commonly known as: Eliquis Take 1 tablet (5 mg total) by mouth 2 (two) times daily.   atorvastatin 40 MG tablet Commonly known as: LIPITOR Take 1 tablet (40 mg total) by mouth daily. What changed: when to take this   budesonide-formoterol 160-4.5 MCG/ACT inhaler Commonly known as: Symbicort Inhale 2 puffs into the lungs 2 (two) times daily for 1 day. What changed: when to take this   fexofenadine 180 MG tablet Commonly known as: ALLEGRA Take 180 mg by mouth daily as needed for allergies or rhinitis.   fluticasone 50 MCG/ACT nasal spray Commonly known as: FLONASE USE 2 SPRAYS NASALLY DAILY What changed:   how much to take  how to take this  when to take this  additional instructions   furosemide 40 MG tablet Commonly known as: LASIX Take 1 tablet (40 mg total) by mouth 2 (two) times daily.   gabapentin 800 MG tablet Commonly known as: NEURONTIN Take 800 mg by mouth See admin instructions. Take 800 mg by mouth three to four times a day   HYDROcodone-acetaminophen 5-325 MG tablet Commonly known as: NORCO/VICODIN Take 1-2 tablets by mouth every 6 (six) hours as needed for moderate pain. What changed:   how much to take  when to take this  additional instructions   insulin glargine 100 UNIT/ML injection Commonly known as: LANTUS Inject 130 Units into the skin in the morning and at bedtime.   levothyroxine 175 MCG tablet Commonly known as: SYNTHROID Take 175 mcg by mouth daily before breakfast.   LORazepam 2 MG tablet Commonly known as: ATIVAN Take 2 mg by mouth at bedtime as needed for anxiety.   losartan 100 MG tablet Commonly known as: COZAAR Take 100 mg by mouth daily.   metoprolol tartrate 50 MG tablet Commonly known as: LOPRESSOR Take 50 mg by mouth in the morning and at bedtime.   nitroGLYCERIN 0.4 MG SL tablet Commonly known as: NITROSTAT Place 1 tablet (0.4 mg  total) under the tongue every 5 (five) minutes as needed for chest pain.   NovoLOG FlexPen 100 UNIT/ML FlexPen Generic drug: insulin aspart Inject 8-13 Units into the skin See admin instructions. Inject 8-13 units into the skin three times a day with meals, per sliding scale (and an additional 1 unit for every 50 points for a BGL >130)   Phenadoz 25 MG suppository Generic drug: promethazine Place 25 mg rectally daily as needed (for headaches).   PRESCRIPTION MEDICATION CPAP: At bedtime and "as needed for shortness of breath"   silver sulfADIAZINE 1 % cream Commonly known as: Silvadene Apply pea-sized amount to wound daily.  Spiriva Respimat 2.5 MCG/ACT Aers Generic drug: Tiotropium Bromide Monohydrate Inhale 2 puffs into the lungs daily.   Spiriva Respimat 2.5 MCG/ACT Aers Generic drug: Tiotropium Bromide Monohydrate Inhale 2 puffs into the lungs daily.   spironolactone 25 MG tablet Commonly known as: ALDACTONE Take 0.5 tablets (12.5 mg total) by mouth daily.   True Metrix Blood Glucose Test test strip Generic drug: glucose blood   TUSSIN DM MAX SUGAR-FREE PO Take 5 mLs by mouth every 4 (four) hours as needed (for coughing).   vitamin B-12 1000 MCG tablet Commonly known as: CYANOCOBALAMIN Take 1,000 mcg by mouth daily.   VITAMIN D-3 PO Take 1 capsule by mouth daily with breakfast.       Past Surgical History:  Kayla [name]  has a past surgical history that includes Thyroidectomy (2011); Total knee arthroplasty (07/27/2011); Abdominal hysterectomy; Anterior cervical decomp/discectomy fusion (N/A, 11/07/2014); Cardiac catheterization (N/A, 11/08/2015); Lumbar disc surgery (1990's); Cholecystectomy (N/A, 04/29/2016); Upper esophageal endoscopic ultrasound (eus) (N/A, 05/14/2016); Cataract extraction w/ intraocular lens  implant, bilateral (Bilateral); Insert / replace / remove pacemaker (2017); Cardiac catheterization; Back surgery; Appendectomy; Joint replacement; and Artery Biopsy  (Right, 02/16/2017).  Family History:  Her family history includes Diabetes in her maternal aunt and maternal uncle; Heart Problems in her father and mother; Heart defect in her mother; Heart disease in her brother; Lung cancer in her daughter; Throat cancer in her brother.  Social History:  Kayla [name]  reports that Kayla [name] quit smoking about 22 years ago. Her smoking use included cigarettes. Kayla [name] started smoking about 55 years ago. Kayla [name] has a 66.00 pack-year smoking history. Kayla [name] has never used smokeless tobacco. Kayla [name] reports that Kayla [name] does not drink alcohol or use drugs.

## 2019-10-04 NOTE — Telephone Encounter (Signed)
I spoke with patient. She reports she was told by ED physician to schedule follow up with cardiology.  This has been scheduled for 6/8 with Kathyrn Drown, NP.  Patient reports she is feeling better after being seen in ED.  She will follow up next week as scheduled.

## 2019-10-05 ENCOUNTER — Ambulatory Visit: Payer: Medicare HMO | Admitting: Podiatry

## 2019-10-05 VITALS — Temp 96.7°F

## 2019-10-05 DIAGNOSIS — L97522 Non-pressure chronic ulcer of other part of left foot with fat layer exposed: Secondary | ICD-10-CM

## 2019-10-05 MED ORDER — CEPHALEXIN 500 MG PO CAPS
500.0000 mg | ORAL_CAPSULE | Freq: Two times a day (BID) | ORAL | 0 refills | Status: DC
Start: 2019-10-05 — End: 2019-10-17

## 2019-10-05 NOTE — Progress Notes (Signed)
  Subjective:  Patient ID: Kayla [name], female    DOB: 04-Jan-1941,  MRN: [id number]  Chief Complaint  Patient presents with  . Wound Check    L hallux. Pt stated, "Occ. shooting pain - 8-9/10. That is worse. I've also had more clear drainage for the past week. I haven't noticed a bad smell. I've been cleaning it with peroxide and applying the Rx cream". No fever/chills/N&V/significantly abnormal glucose.    79 y.o. female presents for wound care. Hx confirmed with patient.  Objective:  Physical Exam: Wound Location: Left hallux Wound Measurement: 2x0.4 Wound Base: Mixed Granular/Fibrotic Peri-wound: Calloused Exudate: Scant/small amount Serosanguinous exudate erythema noted along wound margins extending 1 cm dorsally   No images are attached to the encounter. Assessment:   1. Skin ulcer of left great toe with fat layer exposed ([place])      Plan:  Patient was evaluated and treated and all questions answered.  Ulcer left hallux -Dressing applied consisting of betadine -Offload ulcer with CAM boot -CAM boot dispensed -Wound cleansed and debrided -Rx keflex for mild periwound cellulitis.  Procedure: Excisional Debridement of Wound Indication: Removal of non-viable soft tissue from the wound to promote healing.  Anesthesia: none Pre-Debridement Wound Measurements: 1.5 cm x 0.3 cm x 0.2 cm  Post-Debridement Wound Measurements: 2 cm x 0.4 cm x 0.2 cm  Type of Debridement: Sharp Excisional Tissue Removed: Non-viable soft tissue Instrumentation: 15 blade and tissue nipper Depth of Debridement: subcutaneous tissue. Technique: Sharp excisional debridement to bleeding, viable wound base.  Dressing: Dry, sterile, compression dressing. Disposition: Patient tolerated procedure well. Patient to return in 1 week for follow-up.    Return in about 2 weeks (around 10/19/2019).

## 2019-10-05 NOTE — Progress Notes (Signed)
  Subjective:  Patient ID: Kayla [name], female    DOB: [birth year],  MRN: [id number]  Chief Complaint  Patient presents with  . Wound Check    Left 1st digit ulcer check. Pt states there is drainage. Denies fever/chills/nausea/vomiting.    79 y.o. female presents for wound care. Hx confirmed with patient.  Objective:  Physical Exam: Wound Location: left hallux Wound Measurement: 1x0.3 Wound Base: Granular/Healthy Peri-wound: Calloused Exudate: Scant/small amount Serosanguinous exudate wound without warmth, erythema, signs of acute infection Hallux valgus deformity left  No images are attached to the encounter.   Assessment:   1. Skin ulcer of left great toe with fat layer exposed ([place])      Plan:  Patient was evaluated and treated and all questions answered.  Ulcer left foot -Dressing applied consisting of silvadene, sterile gauze and coban -Offload ulcer with surgical shoe -Wound cleansed and debrided  Procedure: Selective Debridement of Wound Rationale: Removal of devitalized tissue from the wound to promote healing.  Pre-Debridement Wound Measurements: 1 cm x 0.3 cm x 0.2 cm  Post-Debridement Wound Measurements: same as pre-debridement. Type of Debridement: sharp selective Tissue Removed: Devitalized soft-tissue Dressing: Dry, sterile, compression dressing. Disposition: Patient tolerated procedure well. Patient to return in 1 week for follow-up.    Hallux Valgus -Continue tubefoam bunion shield  No follow-ups on file.

## 2019-10-08 NOTE — Progress Notes (Signed)
Cardiology Office Note   Date:  10/10/2019   ID:  Kayla [name], Kayla [name] 06/07/1940, MRN [id number]  PCP:  [name], MD  Cardiologist:  Dr. Marlou Porch, MD   Chief Complaint  Patient presents with  . Edema      History of Present Illness: Kayla [name] is a 79 y.o. female who presents for edema, seen for Dr. Marlou Porch.   Kayla [name] has a history of paroxysmal atrial fibrillation (followed with the AF clinic), CAD, COPD (followed by Dr. Halford Chessman), tobacco abuse with chronic dyspnea, DM, GERD, HTN, hypothyroidism, and OSA. Had bradycardia s/p PPM placement followed by Dr. Caryl Comes,    She underwent Myoview stress test in 2013 found to be low risk, LHC in 2006 with nonobstructive CAD.  Echocardiogram in 2015 with normal LVEF.  She wore a 48-hour Holter monitor in 2015 which showed PACs, PVCs and 3 beats of PAT.  Subsequent event recorder with junctional rhythm in the high 30s and low 40s with associated dizziness >> prompting PPM placement. Carotid Dopplers 40-59% left internal carotid, 39% right.  She was most recently seen by Dr. Marlou Porch 04/13/2019 at which time she had complaints of URI and was placed on prednisone and amoxicillin therapy.    She was then seen in the ED on October 03, 2019 with a 2-day history of progressively worsening shortness of breath with exertion with orthopnea symptoms and an 8 pound weight gain despite compliance with Lasix 40 mg twice daily.  She was also hospitalized approximately 1 month prior for CHF exacerbation and treated with IV Lasix.  EKG showed atrial fibrillation with RVR with a rate of 103 bpm.(Known PAF).  CXR was negative for acute cardiopulmonary abnormalities.  BNP found to be 69 and was previously greater than 200 during her last hospital course.  She was given 1 dose of IV Lasix and placed on 80 mg in the a.m. and 40 mg in the p.m. with plans for close cardiology follow-up.  Creatinine at that time was 1.36.  She then called the office on 10/04/2019  with complaints of LE edema>> plans for OV follow-up.  Today, she is here alone and reports that she had some symptom improvement with the increased dose of Lasix however after the 3 days, states her symptoms returned after transitioning back to 40 mg p.o. twice daily.  She does have known COPD and is followed by pulmonary medicine, Dr. Halford Chessman.  Her inhalers were recently changed and she feels that [name] is not working therefore she plans to call his office to transition back to [place].  She does not appear overtly fluid volume overloaded on exam. Reports home weights at 231lb today with an office weight of 237lb. She reports no significant dietary indiscretions with sodium.  Cooks most of her meals at home.  Denies chest pain, palpitations, dizziness or syncope.  Does have orthopnea symptoms however its unclear if this is secondary to COPD versus diastolic CHF?  Was noted to be in atrial fibrillation in the ED however appears to be in NSR today with rate control per auscultation.  Patient reports that she typically can tell if she is atrial fibrillation and has no s/s to indicate that at this time. See device interrogation from 5/21>>may be due to increased AF? Compliant with her Eliquis.  Past Medical History:  Diagnosis Date  . Allergy   . Anxiety   . Arthritis   . CAD (coronary artery disease)    Mild per remote  cath in 2006, /    Nuclear, January, 2013, no significant abnormality,  . Carotid artery disease ([place])    Doppler, January, 2013, 0 71% RIC A., 24-58% LICA, stable  . Cataract   . CHF (congestive heart failure) ([place])   . Complication of anesthesia    wakes up "mean"  . COPD 02/16/2007   Intolerant of Spiriva, tudorza Intolerant of Trelegy   . COPD (chronic obstructive pulmonary disease) ([place])   . Coronary atherosclerosis 11/24/2008   Catheterization 2006, nonobstructive coronary disease   //   nuclear 2013, low risk    . Diabetes mellitus, type 2 ([place])   . Diverticula of colon   .  DIVERTICULOSIS OF COLON 03/23/2007   Qualifier: Diagnosis of  By: Halford Chessman MD, [place]    . Dizziness    occasional  . Dizzy spells 05/04/2011  . DM (diabetes mellitus) ([place]) 03/23/2007   Qualifier: Diagnosis of  By: Halford Chessman MD, [place]    . Ejection fraction   . Essential hypertension 02/16/2007   Qualifier: Diagnosis of  By: Rosana Hoes CMA, Tammy    . G E R D 03/23/2007   Qualifier: Diagnosis of  By: Halford Chessman MD, Vine[place]    . Gall bladder disease 04/28/2016  . Gastritis and gastroduodenitis   . GERD (gastroesophageal reflux disease)   . Goiter    hx of  . Headache(784.0)    migraine  . Hyperlipidemia   . Hypertension   . Hypothyroidism   . HYPOTHYROIDISM, POSTSURGICAL 06/04/2008   Qualifier: Diagnosis of  By: Loanne Drilling MD, Jacelyn Pi   . Left ventricular hypertrophy   . Leg edema    occasional swelling  . Leg swelling 12/09/2018  . Myofascial pain syndrome, cervical 01/06/2018  . Obesity   . OBESITY 11/24/2008   Qualifier: Diagnosis of  By: Sidney Ace    . OBSTRUCTIVE SLEEP APNEA 02/16/2007   Auto CPAP 09/03/13 to 10/02/13 >> used on 30 of 30 nights with average 6 hrs and 3 min.  Average AHI is 3.1 with median CPAP 5 cm H2O and 95 th percentile CPAP 6 cm H2O.    . Occipital neuralgia of right side 01/06/2018  . OSA (obstructive sleep apnea)    cpap setting of 12  . Pain in joint of right hip 01/21/2018  . Palpitations 01/21/2011   48 hour Holter, 2015, scattered PACs and PVCs.   Marland Kitchen PONV (postoperative nausea and vomiting)    severe after every surgery  . Pulmonary hypertension, secondary   . RHINITIS 07/21/2010       . RUQ abdominal pain   . S/P left TKA 07/27/2011  . Sinus node dysfunction ([place]) 11/08/2015  . Sleep apnea   . Spinal stenosis of cervical region 11/07/2014  . Urinary frequency 12/09/2018  . UTI (urinary tract infection) 08/15/2019   per pt report  . VENTRICULAR HYPERTROPHY, LEFT 11/24/2008   Qualifier: Diagnosis of  By: Sidney Ace      Past Surgical History:  Procedure Laterality  Date  . ABDOMINAL HYSTERECTOMY     with appendectomy and colon polypectomy  . ANTERIOR CERVICAL DECOMP/DISCECTOMY FUSION N/A 11/07/2014   Procedure: Anterior Cervical diskectomy and fusion Cervical four-five, Cervical five-six, Cervical six-seven;  Surgeon: Kary Kos, MD;  Location: [facility];  Service: Neurosurgery;  Laterality: N/A;  . APPENDECTOMY    . ARTERY BIOPSY Right 02/16/2017   Procedure: BIOPSY TEMPORAL ARTERY;  Surgeon: Katha Cabal, MD;  Location: [facility] ORS;  Service: Vascular;  Laterality: Right;  .  BACK SURGERY    . CARDIAC CATHETERIZATION    . CATARACT EXTRACTION W/ INTRAOCULAR LENS  IMPLANT, BILATERAL Bilateral   . CHOLECYSTECTOMY N/A 04/29/2016   Procedure: LAPAROSCOPIC CHOLECYSTECTOMY WITH INTRAOPERATIVE CHOLANGIOGRAM;  Surgeon: Alphonsa Overall, MD;  Location: [facility] ORS;  Service: General;  Laterality: N/A;  . EP IMPLANTABLE DEVICE N/A 11/08/2015   Procedure: Pacemaker Implant;  Surgeon: Deboraha Sprang, MD;  Location: [facility];  Service: Cardiovascular;  Laterality: N/A;  . INSERT / REPLACE / REMOVE PACEMAKER  2017   Dr. Jens Som [facility])  . JOINT REPLACEMENT    . LUMBAR DISC SURGERY  1990's   x 2  . THYROIDECTOMY  2011  . TOTAL KNEE ARTHROPLASTY  07/27/2011   Procedure: TOTAL KNEE ARTHROPLASTY;  Surgeon: Mauri Pole, MD;  Location: [facility] ORS;  Service: Orthopedics;  Laterality: Left;  . UPPER ESOPHAGEAL ENDOSCOPIC ULTRASOUND (EUS) N/A 05/14/2016   Procedure: UPPER ESOPHAGEAL ENDOSCOPIC ULTRASOUND (EUS);  Surgeon: Milus Banister, MD;  Location: [facility];  Service: Endoscopy;  Laterality: N/A;  radial only-will be done mod if needed     Current Outpatient Medications  Medication Sig Dispense Refill  . acetaminophen (TYLENOL) 500 MG tablet Take 1,000 mg by mouth every 6 (six) hours as needed for moderate pain (for headaches).     Marland Kitchen albuterol (PROVENTIL) (2.5 MG/3ML) 0.083% nebulizer solution Take 2.5 mg by nebulization every 2 (two) hours as needed for  wheezing or shortness of breath.     Marland Kitchen albuterol (VENTOLIN HFA) 108 (90 Base) MCG/ACT inhaler Inhale 2 puffs into the lungs every 6 (six) hours as needed for wheezing or shortness of breath. 3 Inhaler 3  . allopurinol (ZYLOPRIM) 100 MG tablet Take 100 mg by mouth 2 (two) times daily.     Marland Kitchen amLODipine (NORVASC) 5 MG tablet Take 1 tablet (5 mg total) by mouth daily. 90 tablet 3  . apixaban (ELIQUIS) 5 MG TABS tablet Take 1 tablet (5 mg total) by mouth 2 (two) times daily. 180 tablet 2  . atorvastatin (LIPITOR) 40 MG tablet Take 1 tablet (40 mg total) by mouth daily. 90 tablet 3  . Budeson-Glycopyrrol-Formoterol (BREZTRI AEROSPHERE) 160-9-4.8 MCG/ACT AERO Inhale 2 puffs into the lungs 2 (two) times daily. 5.9 g 0  . budesonide-formoterol (SYMBICORT) 160-4.5 MCG/ACT inhaler Inhale 2 puffs into the lungs 2 (two) times daily for 1 day. 3 Inhaler 3  . cephALEXin (KEFLEX) 500 MG capsule Take 1 capsule (500 mg total) by mouth 2 (two) times daily. 14 capsule 0  . Cholecalciferol (VITAMIN D-3 PO) Take 1 capsule by mouth daily with breakfast.    . Dextromethorphan-guaiFENesin (TUSSIN DM MAX SUGAR-FREE PO) Take 5 mLs by mouth every 4 (four) hours as needed (for coughing).    . fexofenadine (ALLEGRA) 180 MG tablet Take 180 mg by mouth daily as needed for allergies or rhinitis.    . fluticasone (FLONASE) 50 MCG/ACT nasal spray USE 2 SPRAYS NASALLY DAILY 48 g 1  . furosemide (LASIX) 40 MG tablet Take 1 tablet (40 mg total) by mouth 2 (two) times daily. 60 tablet 0  . gabapentin (NEURONTIN) 800 MG tablet Take 800 mg by mouth See admin instructions. Take 800 mg by mouth three to four times a day    . HYDROcodone-acetaminophen (NORCO/VICODIN) 5-325 MG tablet Take 1-2 tablets by mouth every 6 (six) hours as needed for moderate pain. 30 tablet 0  . insulin aspart (NOVOLOG FLEXPEN) 100 UNIT/ML FlexPen Inject 8-13 Units into the skin See admin instructions.  Inject 8-13 units into the skin three times a day with meals, per  sliding scale (and an additional 1 unit for every 50 points for a BGL >130)    . insulin glargine (LANTUS) 100 UNIT/ML injection Inject 130 Units into the skin in the morning and at bedtime.     Marland Kitchen levothyroxine (SYNTHROID) 175 MCG tablet Take 175 mcg by mouth daily before breakfast.     . LORazepam (ATIVAN) 2 MG tablet Take 2 mg by mouth at bedtime as needed for anxiety.     Marland Kitchen losartan (COZAAR) 100 MG tablet Take 100 mg by mouth daily.     . metoprolol tartrate (LOPRESSOR) 50 MG tablet Take 50 mg by mouth in the morning and at bedtime.    . nitroGLYCERIN (NITROSTAT) 0.4 MG SL tablet Place 1 tablet (0.4 mg total) under the tongue every 5 (five) minutes as needed for chest pain. 25 tablet 4  . PRESCRIPTION MEDICATION CPAP: At bedtime and "as needed for shortness of breath"    . promethazine (PHENADOZ) 25 MG suppository Place 25 mg rectally daily as needed (for headaches).     . silver sulfADIAZINE (SILVADENE) 1 % cream Apply pea-sized amount to wound daily. 50 g 0  . spironolactone (ALDACTONE) 25 MG tablet Take 0.5 tablets (12.5 mg total) by mouth daily. 15 tablet 0  . Tiotropium Bromide Monohydrate (SPIRIVA RESPIMAT) 2.5 MCG/ACT AERS Inhale 2 puffs into the lungs daily. 8 g 0  . Tiotropium Bromide Monohydrate (SPIRIVA RESPIMAT) 2.5 MCG/ACT AERS Inhale 2 puffs into the lungs daily. 12 g 0  . TRUE METRIX BLOOD GLUCOSE TEST test strip     . vitamin B-12 (CYANOCOBALAMIN) 1000 MCG tablet Take 1,000 mcg by mouth daily.     No current facility-administered medications for this visit.    Allergies:   Trulicity [dulaglutide]    Social History:  The patient  reports that she quit smoking about 22 years ago. Her smoking use included cigarettes. She started smoking about 55 years ago. She has a 66.00 pack-year smoking history. She has never used smokeless tobacco. She reports that she does not drink alcohol or use drugs.   Family History:  The patient's family history includes Diabetes in her maternal  aunt and maternal uncle; Heart Problems in her father and mother; Heart defect in her mother; Heart disease in her brother; Lung cancer in her daughter; Throat cancer in her brother.    ROS:  Please see the history of present illness.   Otherwise, review of systems are positive for none. All other systems are reviewed and negative.    PHYSICAL EXAM: VS:  BP 114/62   Pulse 82   Ht 5\' 6"  (1.676 m)   Wt 237 lb (107.5 kg)   SpO2 96%   BMI 38.25 kg/m  , BMI Body mass index is 38.25 kg/m.   General: Well developed, well nourished, NAD Neck: Negative for carotid bruits. No JVD Lungs: Diminished in bilateral bases. No wheezes, rales, or rhonchi. Breathing is unlabored. Cardiovascular: RRR with S1 S2. No murmurs Extremities: Mild edema. Radial pulses 2+ bilaterally Neuro: Alert and oriented. No focal deficits. No facial asymmetry. MAE spontaneously. Psych: Responds to questions appropriately with normal affect.    EKG:  EKG is not ordered today.   Recent Labs: 12/09/2018: Pro B Natriuretic peptide (BNP) 121.0 12/20/2018: Magnesium 1.7 08/16/2019: ALT 16 10/02/2019: BUN 31; Creatinine, Ser 1.36; Hemoglobin 10.4; Platelets 409; Potassium 4.4; Sodium 139 10/03/2019: B Natriuretic Peptide 69.3  Lipid Panel No results found for: CHOL, TRIG, HDL, CHOLHDL, VLDL, LDLCALC, LDLDIRECT    Wt Readings from Last 3 Encounters:  10/10/19 237 lb (107.5 kg)  10/04/19 236 lb (107 kg)  10/03/19 239 lb (108.4 kg)      Other studies Reviewed: Additional studies/ records that were reviewed today include: . Review of the above records demonstrates:  2015 ECHO: - Left ventricle: The cavity size was normal. Wall thickness was increased in a pattern of mild LVH. There was mild focal basal hypertrophy of the septum. Systolic function was normal. The estimated ejection fraction was in the range of 55% to 60%. Wall motion was normal; there were no regional wall motion abnormalities. Doppler  parameters are consistent with abnormal left ventricular relaxation (grade 1 diastolic dysfunction). - Left atrium: The atrium was mildly dilated. - Atrial septum: There was increased thickness of the septum, consistent with lipomatous hypertrophy.  Coronary CT scan 12/31/2017: 1. Coronary calcium score of 1422. This was 63 percentile for age and sex matched control.  2. Normal coronary origin with right dominance.  3. Diffuse plaque with moderate disease in the RCA and LAD. Additional analysis with CT FFR will be submitted.  ASSESSMENT AND PLAN:  1.  Shortness of breath/LE edema: -Given good response with increased dose of Lasix, will plan for lab work today to assess kidney function and plan for Lasix 80 mg in the a.m. and 40 mg in the p.m. with close follow-up -May need transition to torsemide if poor response with the above -May also benefit from [place] to distinguish cardiac versus pulmonary etiology of her shortness of breath and LE edema -Most recent echocardiogram from 08/2019 with stable LVEF and indeterminate diastolic function given AF  2.  PAF: -Last device interrogation 09/22/19 with 3 SVT/AF/AT episodes>> longest episode 4 days? -Appears to be in NSR per auscultation today -Shortness of breath may be related to increased frequency of atrial fibrillation?>>consider alternate medication -On AC with Eliquis with no signs or symptoms of acute bleeding in stool or urine -CBC stable on last lab draw  3.  Nonobstructive CAD: -Denies anginal symptoms -Underwent Myoview stress test in 2013 which was considered low risk -Did have significantly high coronary calcium score of 1300 however FFR analysis was overall reassuring with mild flow-limiting lesion in an obtuse marginal branch with plans for continued medical treatment  4.  PPM placement: -Followed by Dr. Caryl Comes -Continue metoprolol 50 mg twice daily  6.  DM2: -Followed by PCP  7.  Carotid artery disease:   -Carotid artery Doppler studies from 08/2015 with mild plaque in bilateral ICA  8.  HTN: -Stable, 114/62 -No change today l  9.  CKD stage III: -Last creatinine, 1.36 -Baseline appears to be in the 1.3-1.4 range -Recheck today   10.  COPD: -Chronic dyspnea, followed by Dr. Halford Chessman last seen 5/2021with med changes>>>feels these are not working   Current medicines are reviewed at length with the patient today.  The patient does not have concerns regarding medicines.  The following changes have been made: Increase Lasix to 80 mg in a.m. and 40 mg in p.m. x5 days then resume previous dosing of 40 mg twice daily  Labs/ tests ordered today include: BMET No orders of the defined types were placed in this encounter.  Disposition:   FU with Truitt Merle, NP in 1 week  Signed, Kathyrn Drown, NP  10/10/2019 [place], [place]  [address] Phone: [phone]; Fax: [phone]

## 2019-10-09 ENCOUNTER — Other Ambulatory Visit: Payer: Self-pay | Admitting: Student

## 2019-10-10 ENCOUNTER — Other Ambulatory Visit: Payer: Self-pay

## 2019-10-10 ENCOUNTER — Encounter: Payer: Self-pay | Admitting: Cardiology

## 2019-10-10 ENCOUNTER — Ambulatory Visit: Payer: Medicare HMO | Admitting: Cardiology

## 2019-10-10 VITALS — BP 114/62 | HR 82 | Ht 66.0 in | Wt 237.0 lb

## 2019-10-10 DIAGNOSIS — I6523 Occlusion and stenosis of bilateral carotid arteries: Secondary | ICD-10-CM

## 2019-10-10 DIAGNOSIS — I493 Ventricular premature depolarization: Secondary | ICD-10-CM

## 2019-10-10 DIAGNOSIS — Z950 Presence of cardiac pacemaker: Secondary | ICD-10-CM | POA: Diagnosis not present

## 2019-10-10 DIAGNOSIS — N182 Chronic kidney disease, stage 2 (mild): Secondary | ICD-10-CM

## 2019-10-10 DIAGNOSIS — I5043 Acute on chronic combined systolic (congestive) and diastolic (congestive) heart failure: Secondary | ICD-10-CM | POA: Diagnosis not present

## 2019-10-10 LAB — BASIC METABOLIC PANEL
BUN/Creatinine Ratio: 27 (ref 12–28)
BUN: 37 mg/dL — ABNORMAL HIGH (ref 8–27)
CO2: 25 mmol/L (ref 20–29)
Calcium: 10.3 mg/dL (ref 8.7–10.3)
Chloride: 102 mmol/L (ref 96–106)
Creatinine, Ser: 1.38 mg/dL — ABNORMAL HIGH (ref 0.57–1.00)
GFR calc Af Amer: 42 mL/min/{1.73_m2} — ABNORMAL LOW (ref >59)
GFR calc non Af Amer: 37 mL/min/{1.73_m2} — ABNORMAL LOW (ref >59)
Glucose: 145 mg/dL — ABNORMAL HIGH (ref 65–99)
Potassium: 4.9 mmol/L (ref 3.5–5.2)
Sodium: 142 mmol/L (ref 134–144)

## 2019-10-10 NOTE — Patient Instructions (Addendum)
Medication Instructions:   Your physician has recommended you make the following change in your medication:   1) Increase Lasix to 80 mg (2 tablets) by mouth in the morning and 1 tablet by mouth in the evening for 5 days, then resume normal dose of 40 mg by mouth twice a day.  *If you need a refill on your cardiac medications before your next appointment, please call your pharmacy*  Lab Work:  You will have labs drawn today: BMET  Testing/Procedures:  None ordered today  Follow-Up:  On 10/17/19 at 2:15PM with Truitt Merle, NP

## 2019-10-12 NOTE — Progress Notes (Signed)
CARDIOLOGY OFFICE NOTE  Date:  10/17/2019    Kayla [name] Date of Birth: 1940-08-11 Medical Record #[id number]  PCP:  [name], MD  Cardiologist:  [facility]   Chief Complaint  Patient presents with  . Follow-up    History of Present Illness: Kayla [name] is a 79 y.o. female who presents today for a one week check. Seen for Dr. Marlou Porch.   She history of PAF (followed with the AF clinic),CAD, COPD (followed by Dr. Halford Chessman), tobacco abuse with chronic dyspnea, DM, GERD, HTN, hypothyroidism, and OSA. Had bradycardia s/p PPM placement followed by Dr. Caryl Comes,    Myoview stress test in 2013 found to be low risk, LHC in 2006 with nonobstructive CAD. She has had coronary CT in 2019 showing just distal disease in a small OM2 - to manage medically.   Echocardiogram in 2015 with normal LVEF.  She wore a 48-hour Holter monitor in 2015 which showed PACs, PVCs and 3 beats of PAT.  Subsequent event recorder with junctional rhythm in the high 30s and low 40s with associated dizziness >> prompting PPM placement. Carotid Dopplers 40-59% left internal carotid, 39% right.  Last seen by Dr. Marlou Porch in December. In the ER earlier this month with worsening DOE and weight gain. Had been hospitalized one month prior with CHF exacerbation. Has had AF with RVR. Saw Sharee Pimple last week - still with swelling that improved with the increased dose of diuretic but return of symptoms after transitioning back to her usual dose. Remains on Eliquis.   The patient does not have symptoms concerning for COVID-19 infection (fever, chills, cough, or new shortness of breath).   Comes in today. Here alone. She notes that she has not done well for some time. More recently getting worse. She has taken the extra diuretic - little improvement. Can't really do much - gets short of breath. Her swelling will go down some over night. She remains short of breath. She notes that she has some chest pain - uses NTG on occasion -  will take 3 to 4 tablets to get relief - did this about 2 weeks ago - ended up in the ER then but really started with more shortness of breath -treated with IV lasix. Typically her medicines are adjusted - she gets short term relief then relapses. Looks to be having more episodes of PAF. She was in the AF clinic a year ago.   Past Medical History:  Diagnosis Date  . Allergy   . Anxiety   . Arthritis   . CAD (coronary artery disease)    Mild per remote cath in 2006, /    Nuclear, January, 2013, no significant abnormality,  . Carotid artery disease ([place])    Doppler, January, 2013, 0 93% RIC A., 71-69% LICA, stable  . Cataract   . CHF (congestive heart failure) ([place])   . Complication of anesthesia    wakes up "mean"  . COPD 02/16/2007   Intolerant of Spiriva, tudorza Intolerant of Trelegy   . COPD (chronic obstructive pulmonary disease) ([place])   . Coronary atherosclerosis 11/24/2008   Catheterization 2006, nonobstructive coronary disease   //   nuclear 2013, low risk    . Diabetes mellitus, type 2 ([place])   . Diverticula of colon   . DIVERTICULOSIS OF COLON 03/23/2007   Qualifier: Diagnosis of  By: Halford Chessman MD, Vine[place]    . Dizziness    occasional  . Dizzy spells 05/04/2011  . DM (diabetes mellitus) (  [employer]) 03/23/2007   Qualifier: Diagnosis of  By: Halford Chessman MD, Vineet    . Ejection fraction   . Essential hypertension 02/16/2007   Qualifier: Diagnosis of  By: Rosana Hoes CMA, Tammy    . G E R D 03/23/2007   Qualifier: Diagnosis of  By: Halford Chessman MD, Vine[place]    . Gall bladder disease 04/28/2016  . Gastritis and gastroduodenitis   . GERD (gastroesophageal reflux disease)   . Goiter    hx of  . Headache(784.0)    migraine  . Hyperlipidemia   . Hypertension   . Hypothyroidism   . HYPOTHYROIDISM, POSTSURGICAL 06/04/2008   Qualifier: Diagnosis of  By: Loanne Drilling MD, Jacelyn Pi   . Left ventricular hypertrophy   . Leg edema    occasional swelling  . Leg swelling 12/09/2018  . Myofascial pain syndrome, cervical  01/06/2018  . Obesity   . OBESITY 11/24/2008   Qualifier: Diagnosis of  By: Sidney Ace    . OBSTRUCTIVE SLEEP APNEA 02/16/2007   Auto CPAP 09/03/13 to 10/02/13 >> used on 30 of 30 nights with average 6 hrs and 3 min.  Average AHI is 3.1 with median CPAP 5 cm H2O and 95 th percentile CPAP 6 cm H2O.    . Occipital neuralgia of right side 01/06/2018  . OSA (obstructive sleep apnea)    cpap setting of 12  . Pain in joint of right hip 01/21/2018  . Palpitations 01/21/2011   48 hour Holter, 2015, scattered PACs and PVCs.   Marland Kitchen PONV (postoperative nausea and vomiting)    severe after every surgery  . Pulmonary hypertension, secondary   . RHINITIS 07/21/2010       . RUQ abdominal pain   . S/P left TKA 07/27/2011  . Sinus node dysfunction ([place]) 11/08/2015  . Sleep apnea   . Spinal stenosis of cervical region 11/07/2014  . Urinary frequency 12/09/2018  . UTI (urinary tract infection) 08/15/2019   per pt report  . VENTRICULAR HYPERTROPHY, LEFT 11/24/2008   Qualifier: Diagnosis of  By: Sidney Ace      Past Surgical History:  Procedure Laterality Date  . ABDOMINAL HYSTERECTOMY     with appendectomy and colon polypectomy  . ANTERIOR CERVICAL DECOMP/DISCECTOMY FUSION N/A 11/07/2014   Procedure: Anterior Cervical diskectomy and fusion Cervical four-five, Cervical five-six, Cervical six-seven;  Surgeon: Kary Kos, MD;  Location: [facility];  Service: Neurosurgery;  Laterality: N/A;  . APPENDECTOMY    . ARTERY BIOPSY Right 02/16/2017   Procedure: BIOPSY TEMPORAL ARTERY;  Surgeon: Katha Cabal, MD;  Location: [facility] ORS;  Service: Vascular;  Laterality: Right;  . BACK SURGERY    . CARDIAC CATHETERIZATION    . CATARACT EXTRACTION W/ INTRAOCULAR LENS  IMPLANT, BILATERAL Bilateral   . CHOLECYSTECTOMY N/A 04/29/2016   Procedure: LAPAROSCOPIC CHOLECYSTECTOMY WITH INTRAOPERATIVE CHOLANGIOGRAM;  Surgeon: Alphonsa Overall, MD;  Location: [facility];  Service: General;  Laterality: N/A;  . EP IMPLANTABLE DEVICE N/A  11/08/2015   Procedure: Pacemaker Implant;  Surgeon: Deboraha Sprang, MD;  Location: [facility];  Service: Cardiovascular;  Laterality: N/A;  . INSERT / REPLACE / REMOVE PACEMAKER  2017   Dr. Jens Som [facility])  . JOINT REPLACEMENT    . LUMBAR DISC SURGERY  1990's   x 2  . THYROIDECTOMY  2011  . TOTAL KNEE ARTHROPLASTY  07/27/2011   Procedure: TOTAL KNEE ARTHROPLASTY;  Surgeon: Mauri Pole, MD;  Location: [facility];  Service: Orthopedics;  Laterality: Left;  . UPPER ESOPHAGEAL  ENDOSCOPIC ULTRASOUND (EUS) N/A 05/14/2016   Procedure: UPPER ESOPHAGEAL ENDOSCOPIC ULTRASOUND (EUS);  Surgeon: Milus Banister, MD;  Location: [facility];  Service: Endoscopy;  Laterality: N/A;  radial only-will be done mod if needed     Medications: Current Meds  Medication Sig  . acetaminophen (TYLENOL) 500 MG tablet Take 1,000 mg by mouth every 6 (six) hours as needed for moderate pain (for headaches).   Marland Kitchen albuterol (PROVENTIL) (2.5 MG/3ML) 0.083% nebulizer solution Take 2.5 mg by nebulization every 2 (two) hours as needed for wheezing or shortness of breath.   Marland Kitchen albuterol (VENTOLIN HFA) 108 (90 Base) MCG/ACT inhaler Inhale 2 puffs into the lungs every 6 (six) hours as needed for wheezing or shortness of breath.  . allopurinol (ZYLOPRIM) 100 MG tablet Take 100 mg by mouth 2 (two) times daily.   Marland Kitchen amLODipine (NORVASC) 5 MG tablet Take 1 tablet (5 mg total) by mouth daily.  Marland Kitchen apixaban (ELIQUIS) 5 MG TABS tablet Take 1 tablet (5 mg total) by mouth 2 (two) times daily.  Marland Kitchen atorvastatin (LIPITOR) 40 MG tablet Take 1 tablet (40 mg total) by mouth daily.  . Budeson-Glycopyrrol-Formoterol (BREZTRI AEROSPHERE) 160-9-4.8 MCG/ACT AERO Inhale 2 puffs into the lungs 2 (two) times daily.  . budesonide-formoterol (SYMBICORT) 160-4.5 MCG/ACT inhaler Inhale 2 puffs into the lungs 2 (two) times daily for 1 day.  . Cholecalciferol (VITAMIN D-3 PO) Take 1 capsule by mouth daily with breakfast.  . Dextromethorphan-guaiFENesin  (TUSSIN DM MAX SUGAR-FREE PO) Take 5 mLs by mouth every 4 (four) hours as needed (for coughing).  . fexofenadine (ALLEGRA) 180 MG tablet Take 180 mg by mouth daily as needed for allergies or rhinitis.  . fluticasone (FLONASE) 50 MCG/ACT nasal spray USE 2 SPRAYS NASALLY DAILY  . gabapentin (NEURONTIN) 800 MG tablet Take 800 mg by mouth See admin instructions. Take 800 mg by mouth three to four times a day  . HYDROcodone-acetaminophen (NORCO/VICODIN) 5-325 MG tablet Take 1-2 tablets by mouth every 6 (six) hours as needed for moderate pain.  Marland Kitchen insulin aspart (NOVOLOG FLEXPEN) 100 UNIT/ML FlexPen Inject 8-13 Units into the skin See admin instructions. Inject 8-13 units into the skin three times a day with meals, per sliding scale (and an additional 1 unit for every 50 points for a BGL >130)  . insulin glargine (LANTUS) 100 UNIT/ML injection Inject 130 Units into the skin in the morning and at bedtime.   Marland Kitchen levothyroxine (SYNTHROID) 175 MCG tablet Take 175 mcg by mouth daily before breakfast.   . LORazepam (ATIVAN) 2 MG tablet Take 2 mg by mouth at bedtime as needed for anxiety.   Marland Kitchen losartan (COZAAR) 100 MG tablet Take 100 mg by mouth daily.   . metoprolol tartrate (LOPRESSOR) 50 MG tablet Take 50 mg by mouth in the morning and at bedtime.  . nitroGLYCERIN (NITROSTAT) 0.4 MG SL tablet Place 1 tablet (0.4 mg total) under the tongue every 5 (five) minutes as needed for chest pain.  Marland Kitchen PRESCRIPTION MEDICATION CPAP: At bedtime and "as needed for shortness of breath"  . promethazine (PHENADOZ) 25 MG suppository Place 25 mg rectally daily as needed (for headaches).   . silver sulfADIAZINE (SILVADENE) 1 % cream Apply pea-sized amount to wound daily.  . Tiotropium Bromide Monohydrate (SPIRIVA RESPIMAT) 2.5 MCG/ACT AERS Inhale 2 puffs into the lungs daily.  . Tiotropium Bromide Monohydrate (SPIRIVA RESPIMAT) 2.5 MCG/ACT AERS Inhale 2 puffs into the lungs daily.  . TRUE METRIX BLOOD GLUCOSE TEST test strip   .  vitamin B-12 (CYANOCOBALAMIN) 1000 MCG tablet Take 1,000 mcg by mouth daily.  . [DISCONTINUED] furosemide (LASIX) 40 MG tablet Take 1 tablet (40 mg total) by mouth 2 (two) times daily.     Allergies: Allergies  Allergen Reactions  . Trulicity [Dulaglutide] Nausea Only and Other (See Comments)    Made the patient sick to her stomach (only) several days after using it    Social History: The patient  reports that she quit smoking about 22 years ago. Her smoking use included cigarettes. She started smoking about 55 years ago. She has a 66.00 pack-year smoking history. She has never used smokeless tobacco. She reports that she does not drink alcohol and does not use drugs.   Family History: The patient's family history includes Diabetes in her maternal aunt and maternal uncle; Heart Problems in her father and mother; Heart defect in her mother; Heart disease in her brother; Lung cancer in her daughter; Throat cancer in her brother.   Review of Systems: Please see the history of present illness.   All other systems are reviewed and negative.   Physical Exam: VS:  BP 114/68   Pulse 79   Ht 5\' 6"  (1.676 m)   Wt 239 lb (108.4 kg)   SpO2 96%   BMI 38.58 kg/m  .  BMI Body mass index is 38.58 kg/m.  Wt Readings from Last 3 Encounters:  10/17/19 239 lb (108.4 kg)  10/10/19 237 lb (107.5 kg)  10/04/19 236 lb (107 kg)     General: Alert and in no acute distress. Remains obese.   Cardiac: Irregular irregular rhythm. Her rate is ok. Heart tones are distant. Trace edema.  Respiratory:  Lungs are clear to auscultation bilaterally with normal work of breathing. She did get short of breath with coming into the office.  GI: Soft and nontender.  MS: No deformity or atrophy. Gait and ROM intact.  Skin: Warm and dry. Color is normal.  Neuro:  Strength and sensation are intact and no gross focal deficits noted.  Psych: Alert, appropriate and with normal affect.   LABORATORY DATA:  EKG:  EKG is  ordered today.  Personally reviewed reviewed by me - this looks to show recurrent AF.   Lab Results  Component Value Date   WBC 11.5 (H) 10/02/2019   HGB 10.4 (L) 10/02/2019   HCT 34.1 (L) 10/02/2019   PLT 409 (H) 10/02/2019   GLUCOSE 145 (H) 10/10/2019   ALT 16 08/16/2019   AST 21 08/16/2019   NA 142 10/10/2019   K 4.9 10/10/2019   CL 102 10/10/2019   CREATININE 1.38 (H) 10/10/2019   BUN 37 (H) 10/10/2019   CO2 25 10/10/2019   TSH 0.123 (L) 11/08/2015   INR 1.1 10/02/2019   HGBA1C 9.0 (H) 08/13/2019       BNP (last 3 results) Recent Labs    08/12/19 2333 10/03/19 0114  BNP 479.7* 69.3    ProBNP (last 3 results) Recent Labs    12/09/18 0935  PROBNP 121.0*     Other Studies Reviewed Today:  ECHO IMPRESSIONS 08/2019  1. Left ventricular ejection fraction, by estimation, is 65 to 70%. The  left ventricle has normal function. The left ventricle has no regional  wall motion abnormalities. There is moderate concentric left ventricular  hypertrophy. Unable to assess LV  diastolic filling due to underlying atrial fibrillation.  2. Right ventricular systolic function is normal. The right ventricular  size is normal. There is normal pulmonary artery systolic  pressure.  3. Right atrial size was moderately dilated.  4. The mitral valve is normal in structure. No evidence of mitral valve  regurgitation. No evidence of mitral stenosis.  5. The aortic valve is tricuspid. Aortic valve regurgitation is not  visualized. Mild aortic valve sclerosis is present, with no evidence of  aortic valve stenosis.  6. The inferior vena cava is dilated in size with >50% respiratory  variability, suggesting right atrial pressure of 8 mmHg.   Coronary CT scan 12/31/2017: 1. Coronary calcium score of 1422. This was 61 percentile for age and sex matched control.  2. Normal coronary origin with right dominance.  3. Diffuse plaque with moderate disease in the RCA and  LAD. Additional analysis with CT FFR will be submitted.  ASSESSMENT AND PLAN:  1.  PAF - looks to be having more PAF - suspect this could be the culprit for her demise - will send back to AF clinic. She is anticoagulated.   2. Chronic shortness of breath/swelling - probably multifactorial (obesity/COPD/diastolic dysfunction) has had short term relief with sporadic increases in diuretic - will change to Torsemide 20 mg BID today. Lab today. Her last echo is noted.   3 Chronic anemia - rechecking CBC.   4. COPD - prior smoker - certainly influences her dyspnea.   5. Obesity   6. Non obstructive CAD - prior coronary CT noted - she is managed medically. I get the feeling that she is using her NTG more because of her shortness of breath.   7. Chronic anticoagulation  8. PPM - looks to be having more AF - this may be the cause of her demise and short lived results - she will send transmission today when she gets home. Device clinic is aware.   9. DM - per PCP  10. CKD - lab today.   Current medicines are reviewed with the patient today.  The patient does not have concerns regarding medicines other than what has been noted above.  The following changes have been made:  See above.  Labs/ tests ordered today include:    Orders Placed This Encounter  Procedures  . Basic metabolic panel  . CBC  . TSH     Disposition:   FU with Dr. Marlou Porch as planned. Will get back to AF clinic next week. Lab today. Changed to Torsemide.     Patient is agreeable to this plan and will call if any problems develop in the interim.   SignedTruitt Merle, NP  10/17/2019 2:57 PM  [facility] [address] [place], [place]  [address] Phone: [phone] Fax: [phone]

## 2019-10-17 ENCOUNTER — Encounter: Payer: Self-pay | Admitting: Nurse Practitioner

## 2019-10-17 ENCOUNTER — Telehealth: Payer: Self-pay | Admitting: *Deleted

## 2019-10-17 ENCOUNTER — Ambulatory Visit: Payer: Medicare HMO | Admitting: Nurse Practitioner

## 2019-10-17 ENCOUNTER — Other Ambulatory Visit: Payer: Self-pay

## 2019-10-17 VITALS — BP 114/68 | HR 79 | Ht 66.0 in | Wt 239.0 lb

## 2019-10-17 DIAGNOSIS — I10 Essential (primary) hypertension: Secondary | ICD-10-CM | POA: Diagnosis not present

## 2019-10-17 DIAGNOSIS — Z950 Presence of cardiac pacemaker: Secondary | ICD-10-CM | POA: Diagnosis not present

## 2019-10-17 DIAGNOSIS — I5032 Chronic diastolic (congestive) heart failure: Secondary | ICD-10-CM | POA: Diagnosis not present

## 2019-10-17 DIAGNOSIS — I480 Paroxysmal atrial fibrillation: Secondary | ICD-10-CM | POA: Diagnosis not present

## 2019-10-17 DIAGNOSIS — Z7901 Long term (current) use of anticoagulants: Secondary | ICD-10-CM | POA: Diagnosis not present

## 2019-10-17 MED ORDER — TORSEMIDE 20 MG PO TABS
20.0000 mg | ORAL_TABLET | Freq: Two times a day (BID) | ORAL | 3 refills | Status: DC
Start: 2019-10-17 — End: 2020-01-26

## 2019-10-17 NOTE — Patient Instructions (Addendum)
After Visit Summary:  We will be checking the following labs today - BMET, CBC, TSH   Medication Instructions:    Continue with your current medicines. BUT  I am stopping the Lasix  We are going to try Torsemide 20 mg to take twice a day - this is at your local pharmacy   If you need a refill on your cardiac medications before your next appointment, please call your pharmacy.     Testing/Procedures To Be Arranged:  N/A  Follow-Up:   See Clint or Butch Penny in the AF clinic  Keep follow up with Dr. Marlou Porch for next month here    At [facility], you and your health needs are our priority.  As part of our continuing mission to provide you with exceptional heart care, we have created designated Provider Care Teams.  These Care Teams include your primary Cardiologist (physician) and Advanced Practice Providers (APPs -  Physician Assistants and Nurse Practitioners) who all work together to provide you with the care you need, when you need it.  Special Instructions:  . Stay safe, wash your hands for at least 20 seconds and wear a mask when needed.  . It was good to talk with you today. Hollace Kinnier your pacemaker when you get home today.    Call the [place] office at [phone] if you have any questions, problems or concerns.

## 2019-10-17 NOTE — Telephone Encounter (Signed)
PPM transmission received 10/17/19 at 16:06. Presenting rhythm AF/VP, VS 90s-130s AT/AF burden 28.5% since 09/22/19, longest AF episode was 47hrs duration beginning on 10/12/19. Current episode began 10/17/19 at 12:47. V rates overall controlled. Routed to Truitt Merle, NP, as requested.

## 2019-10-18 LAB — BASIC METABOLIC PANEL
BUN/Creatinine Ratio: 30 — ABNORMAL HIGH (ref 12–28)
BUN: 41 mg/dL — ABNORMAL HIGH (ref 8–27)
CO2: 22 mmol/L (ref 20–29)
Calcium: 9.2 mg/dL (ref 8.7–10.3)
Chloride: 103 mmol/L (ref 96–106)
Creatinine, Ser: 1.35 mg/dL — ABNORMAL HIGH (ref 0.57–1.00)
GFR calc Af Amer: 43 mL/min/{1.73_m2} — ABNORMAL LOW (ref >59)
GFR calc non Af Amer: 38 mL/min/{1.73_m2} — ABNORMAL LOW (ref >59)
Glucose: 88 mg/dL (ref 65–99)
Potassium: 5.1 mmol/L (ref 3.5–5.2)
Sodium: 141 mmol/L (ref 134–144)

## 2019-10-18 LAB — CBC
Hematocrit: 29.2 % — ABNORMAL LOW (ref 34.0–46.6)
Hemoglobin: 9.6 g/dL — ABNORMAL LOW (ref 11.1–15.9)
MCH: 28.4 pg (ref 26.6–33.0)
MCHC: 32.9 g/dL (ref 31.5–35.7)
MCV: 86 fL (ref 79–97)
Platelets: 504 10*3/uL — ABNORMAL HIGH (ref 150–450)
RBC: 3.38 x10E6/uL — ABNORMAL LOW (ref 3.77–5.28)
RDW: 15.7 % — ABNORMAL HIGH (ref 11.7–15.4)
WBC: 11.4 10*3/uL — ABNORMAL HIGH (ref 3.4–10.8)

## 2019-10-18 LAB — TSH: TSH: 2.64 u[IU]/mL (ref 0.450–4.500)

## 2019-10-18 NOTE — Addendum Note (Signed)
Addended by: Lanna Poche R on: 10/18/2019 05:28 PM   Modules accepted: Orders

## 2019-10-18 NOTE — Telephone Encounter (Signed)
Thanks Raquel Sarna!  Routing to [employer] as well as FYI - patient to be seen in AF clinic next week.   I still suspect her PAF is contributing to her diastolic HF.   Cecille Rubin

## 2019-10-19 ENCOUNTER — Ambulatory Visit: Payer: Medicare HMO | Admitting: Podiatry

## 2019-10-19 DIAGNOSIS — M5416 Radiculopathy, lumbar region: Secondary | ICD-10-CM | POA: Diagnosis not present

## 2019-10-19 DIAGNOSIS — R202 Paresthesia of skin: Secondary | ICD-10-CM | POA: Diagnosis not present

## 2019-10-19 DIAGNOSIS — R200 Anesthesia of skin: Secondary | ICD-10-CM | POA: Diagnosis not present

## 2019-10-19 DIAGNOSIS — M5431 Sciatica, right side: Secondary | ICD-10-CM | POA: Diagnosis not present

## 2019-10-20 ENCOUNTER — Other Ambulatory Visit: Payer: Self-pay

## 2019-10-20 ENCOUNTER — Ambulatory Visit: Payer: Medicare HMO | Admitting: Podiatry

## 2019-10-20 DIAGNOSIS — L97522 Non-pressure chronic ulcer of other part of left foot with fat layer exposed: Secondary | ICD-10-CM | POA: Diagnosis not present

## 2019-10-20 NOTE — Progress Notes (Signed)
  Subjective:  Patient ID: Kayla [name], female    DOB: 10/01/1940,  MRN: [id number]  No chief complaint on file.  79 y.o. female presents for wound care. Hx confirmed with patient.  Objective:  Physical Exam: Wound Location: Left hallux Wound Measurement: 1.5x2 Wound Base: Mixed Granular/Fibrotic Peri-wound: Calloused Exudate: Scant/small amount Serosanguinous exudate erythema noted along wound margins extending 1 cm dorsally   No images are attached to the encounter. Assessment:   1. Skin ulcer of left great toe with fat layer exposed ([place])    Plan:  Patient was evaluated and treated and all questions answered.  Ulcer left hallux -Dressing applied consisting of iodosorb -Offload ulcer with CAM boot or shoe whichever she can tolerate -Wound cleansed and debrided -Ultimately may need surgical correction however we will have to evaluate if she is a good sx candidate.  Procedure: Excisional Debridement of Wound Indication: Removal of non-viable soft tissue from the wound to promote healing.  Anesthesia: none Pre-Debridement Wound Measurements: 1 cm x 2 cm x 0.2 cm  Post-Debridement Wound Measurements: 1.5 cm x 2 cm x 0.2 cm  Type of Debridement: Sharp Excisional Tissue Removed: Non-viable soft tissue Instrumentation: 15 blade and tissue nipper Depth of Debridement: subcutaneous tissue. Technique: Sharp excisional debridement to bleeding, viable wound base.  Dressing: Dry, sterile, compression dressing. Disposition: Patient tolerated procedure well. Patient to return in 1 week for follow-up.  Return in about 2 weeks (around 11/03/2019) for Wound Care, Left.

## 2019-10-24 DIAGNOSIS — E1149 Type 2 diabetes mellitus with other diabetic neurological complication: Secondary | ICD-10-CM | POA: Diagnosis not present

## 2019-10-24 DIAGNOSIS — E1165 Type 2 diabetes mellitus with hyperglycemia: Secondary | ICD-10-CM | POA: Diagnosis not present

## 2019-10-24 DIAGNOSIS — E890 Postprocedural hypothyroidism: Secondary | ICD-10-CM | POA: Diagnosis not present

## 2019-10-24 DIAGNOSIS — Z79899 Other long term (current) drug therapy: Secondary | ICD-10-CM | POA: Diagnosis not present

## 2019-10-24 DIAGNOSIS — E790 Hyperuricemia without signs of inflammatory arthritis and tophaceous disease: Secondary | ICD-10-CM | POA: Diagnosis not present

## 2019-10-24 DIAGNOSIS — G8929 Other chronic pain: Secondary | ICD-10-CM | POA: Diagnosis not present

## 2019-10-24 DIAGNOSIS — M5441 Lumbago with sciatica, right side: Secondary | ICD-10-CM | POA: Diagnosis not present

## 2019-10-24 DIAGNOSIS — Z6837 Body mass index (BMI) 37.0-37.9, adult: Secondary | ICD-10-CM | POA: Diagnosis not present

## 2019-10-25 ENCOUNTER — Telehealth: Payer: Self-pay | Admitting: Pharmacist

## 2019-10-25 ENCOUNTER — Ambulatory Visit (HOSPITAL_COMMUNITY)
Admission: RE | Admit: 2019-10-25 | Discharge: 2019-10-25 | Disposition: A | Discharge status: Home / Self Care | Payer: Medicare HMO | Source: Ambulatory Visit | Attending: Nurse Practitioner | Admitting: Nurse Practitioner

## 2019-10-25 ENCOUNTER — Other Ambulatory Visit: Payer: Self-pay

## 2019-10-25 VITALS — BP 120/50 | HR 60 | Ht 66.0 in | Wt 238.6 lb

## 2019-10-25 DIAGNOSIS — Z7901 Long term (current) use of anticoagulants: Secondary | ICD-10-CM | POA: Insufficient documentation

## 2019-10-25 DIAGNOSIS — Z6838 Body mass index (BMI) 38.0-38.9, adult: Secondary | ICD-10-CM | POA: Insufficient documentation

## 2019-10-25 DIAGNOSIS — E039 Hypothyroidism, unspecified: Secondary | ICD-10-CM | POA: Diagnosis not present

## 2019-10-25 DIAGNOSIS — I5032 Chronic diastolic (congestive) heart failure: Secondary | ICD-10-CM | POA: Insufficient documentation

## 2019-10-25 DIAGNOSIS — I110 Hypertensive heart disease with heart failure: Secondary | ICD-10-CM | POA: Diagnosis not present

## 2019-10-25 DIAGNOSIS — Z8249 Family history of ischemic heart disease and other diseases of the circulatory system: Secondary | ICD-10-CM | POA: Insufficient documentation

## 2019-10-25 DIAGNOSIS — E119 Type 2 diabetes mellitus without complications: Secondary | ICD-10-CM | POA: Insufficient documentation

## 2019-10-25 DIAGNOSIS — J449 Chronic obstructive pulmonary disease, unspecified: Secondary | ICD-10-CM | POA: Insufficient documentation

## 2019-10-25 DIAGNOSIS — D6869 Other thrombophilia: Secondary | ICD-10-CM | POA: Insufficient documentation

## 2019-10-25 DIAGNOSIS — Z888 Allergy status to other drugs, medicaments and biological substances status: Secondary | ICD-10-CM | POA: Diagnosis not present

## 2019-10-25 DIAGNOSIS — Z79899 Other long term (current) drug therapy: Secondary | ICD-10-CM | POA: Diagnosis not present

## 2019-10-25 DIAGNOSIS — Z87891 Personal history of nicotine dependence: Secondary | ICD-10-CM | POA: Insufficient documentation

## 2019-10-25 DIAGNOSIS — G4733 Obstructive sleep apnea (adult) (pediatric): Secondary | ICD-10-CM | POA: Insufficient documentation

## 2019-10-25 DIAGNOSIS — I2510 Atherosclerotic heart disease of native coronary artery without angina pectoris: Secondary | ICD-10-CM | POA: Diagnosis not present

## 2019-10-25 DIAGNOSIS — Z79890 Hormone replacement therapy (postmenopausal): Secondary | ICD-10-CM | POA: Diagnosis not present

## 2019-10-25 DIAGNOSIS — K219 Gastro-esophageal reflux disease without esophagitis: Secondary | ICD-10-CM | POA: Diagnosis not present

## 2019-10-25 DIAGNOSIS — Z833 Family history of diabetes mellitus: Secondary | ICD-10-CM | POA: Insufficient documentation

## 2019-10-25 DIAGNOSIS — I480 Paroxysmal atrial fibrillation: Secondary | ICD-10-CM | POA: Diagnosis not present

## 2019-10-25 DIAGNOSIS — E785 Hyperlipidemia, unspecified: Secondary | ICD-10-CM | POA: Diagnosis not present

## 2019-10-25 DIAGNOSIS — Z794 Long term (current) use of insulin: Secondary | ICD-10-CM | POA: Diagnosis not present

## 2019-10-25 DIAGNOSIS — Z950 Presence of cardiac pacemaker: Secondary | ICD-10-CM | POA: Insufficient documentation

## 2019-10-25 DIAGNOSIS — E669 Obesity, unspecified: Secondary | ICD-10-CM | POA: Insufficient documentation

## 2019-10-25 MED ORDER — MAGNESIUM OXIDE -MG SUPPLEMENT 400 (240 MG) MG PO TABS: 400.0000 mg | ORAL_TABLET | Freq: Every day | ORAL | 3 refills | Status: DC

## 2019-10-25 NOTE — Telephone Encounter (Signed)
Medication list reviewed in anticipation of upcoming Tikosyn initiation. Patient is not taking any contraindicated medications. She is on 3 QTc prolonging medications (albuterol, Symbicort, and Breztri (pt reports not taking this). QTc should be monitored closely.  Patient is anticoagulated on Eliquis on the appropriate dose. Please ensure that patient has not missed any anticoagulation doses in the 3 weeks prior to Tikosyn initiation.   Patient will need to be counseled to avoid use of Benadryl while on Tikosyn and in the 2-3 days prior to Tikosyn initiation.  Please ensure that K is at least 4 and Mg is at least 2 especially since patient is on loop diuretic.

## 2019-10-25 NOTE — Progress Notes (Signed)
Primary Care Physician: [name], MD Primary Cardiologist: Dr Marlou Porch Primary Electrophysiologist: Dr Caryl Comes Referring Physician: Dr Chriss Czar is a 79 y.o. female with a history of paroxysmal atrial fibrillation, CAD, COPD, tobacco abuse, DM, GERD, HTN, hypothyroidism, and OSA who presents for follow up in the [facility]. Patient noted to have paroxysmal atrial fibrillation noted on remote device transmission. Longest episode was about 7 days. Ventricular rates appear controlled. Patient states that she "thought everything was going great" until the device clinic called. She does report that she may have increase SOB over the last few weeks but this is improving. There were no specific triggers that the patient could identify.  On follow up today, patient was hospitalized 08/2019 with acute on chronic CHF. She was noted to be in rate controlled afib at the time. Device interrogation shows an increased afib burden (38.9%) with overall controlled V rates. She continues to have symptoms of fatigue and fluid retention when she is in afib. She denies bleeding issues on anticoagulation.   Today, she denies symptoms of palpitations, chest pain, orthopnea, PND, dizziness, presyncope, syncope, bleeding, or neurologic sequela. The patient is tolerating medications without difficulties and is otherwise without complaint today.    Atrial Fibrillation Risk Factors:  she does have symptoms or diagnosis of sleep apnea. she is compliant with CPAP therapy.   she has a BMI of Body mass index is 38.51 kg/m.Marland Kitchen Filed Weights   10/25/19 1435  Weight: 108.2 kg    Family History  Problem Relation Age of Onset  . Heart Problems Mother   . Heart defect Mother        valve problems  . Heart Problems Father   . Heart disease Brother   . Lung cancer Daughter        non small cell   . Diabetes Maternal Aunt   . Diabetes Maternal Uncle   . Throat cancer  Brother   . Colon cancer Neg Hx   . Colon polyps Neg Hx   . Esophageal cancer Neg Hx   . Rectal cancer Neg Hx   . Stomach cancer Neg Hx      Atrial Fibrillation Management history:  Previous antiarrhythmic drugs: none Previous cardioversions: none Previous ablations: none CHADS2VASC score: 6 Anticoagulation history: Eliquis    Past Medical History:  Diagnosis Date  . Allergy   . Anxiety   . Arthritis   . CAD (coronary artery disease)    Mild per remote cath in 2006, /    Nuclear, January, 2013, no significant abnormality,  . Carotid artery disease ([place])    Doppler, January, 2013, 0 66% RIC A., 44-03% LICA, stable  . Cataract   . CHF (congestive heart failure) ([place])   . Complication of anesthesia    wakes up "mean"  . COPD 02/16/2007   Intolerant of Spiriva, tudorza Intolerant of Trelegy   . COPD (chronic obstructive pulmonary disease) ([place])   . Coronary atherosclerosis 11/24/2008   Catheterization 2006, nonobstructive coronary disease   //   nuclear 2013, low risk    . Diabetes mellitus, type 2 ([place])   . Diverticula of colon   . DIVERTICULOSIS OF COLON 03/23/2007   Qualifier: Diagnosis of  By: Halford Chessman MD, Vine[place]    . Dizziness    occasional  . Dizzy spells 05/04/2011  . DM (diabetes mellitus) ([place]) 03/23/2007   Qualifier: Diagnosis of  By: Halford Chessman MD, Vineet    .  Ejection fraction   . Essential hypertension 02/16/2007   Qualifier: Diagnosis of  By: Rosana Hoes CMA, Tammy    . G E R D 03/23/2007   Qualifier: Diagnosis of  By: Halford Chessman MD, Vineet    . Gall bladder disease 04/28/2016  . Gastritis and gastroduodenitis   . GERD (gastroesophageal reflux disease)   . Goiter    hx of  . Headache(784.0)    migraine  . Hyperlipidemia   . Hypertension   . Hypothyroidism   . HYPOTHYROIDISM, POSTSURGICAL 06/04/2008   Qualifier: Diagnosis of  By: Loanne Drilling MD, Jacelyn Pi   . Left ventricular hypertrophy   . Leg edema    occasional swelling  . Leg swelling 12/09/2018  . Myofascial pain  syndrome, cervical 01/06/2018  . Obesity   . OBESITY 11/24/2008   Qualifier: Diagnosis of  By: Sidney Ace    . OBSTRUCTIVE SLEEP APNEA 02/16/2007   Auto CPAP 09/03/13 to 10/02/13 >> used on 30 of 30 nights with average 6 hrs and 3 min.  Average AHI is 3.1 with median CPAP 5 cm H2O and 95 th percentile CPAP 6 cm H2O.    . Occipital neuralgia of right side 01/06/2018  . OSA (obstructive sleep apnea)    cpap setting of 12  . Pain in joint of right hip 01/21/2018  . Palpitations 01/21/2011   48 hour Holter, 2015, scattered PACs and PVCs.   Marland Kitchen PONV (postoperative nausea and vomiting)    severe after every surgery  . Pulmonary hypertension, secondary   . RHINITIS 07/21/2010       . RUQ abdominal pain   . S/P left TKA 07/27/2011  . Sinus node dysfunction ([place]) 11/08/2015  . Sleep apnea   . Spinal stenosis of cervical region 11/07/2014  . Urinary frequency 12/09/2018  . UTI (urinary tract infection) 08/15/2019   per pt report  . VENTRICULAR HYPERTROPHY, LEFT 11/24/2008   Qualifier: Diagnosis of  By: Sidney Ace     Past Surgical History:  Procedure Laterality Date  . ABDOMINAL HYSTERECTOMY     with appendectomy and colon polypectomy  . ANTERIOR CERVICAL DECOMP/DISCECTOMY FUSION N/A 11/07/2014   Procedure: Anterior Cervical diskectomy and fusion Cervical four-five, Cervical five-six, Cervical six-seven;  Surgeon: Kary Kos, MD;  Location: [facility];  Service: Neurosurgery;  Laterality: N/A;  . APPENDECTOMY    . ARTERY BIOPSY Right 02/16/2017   Procedure: BIOPSY TEMPORAL ARTERY;  Surgeon: Katha Cabal, MD;  Location: [facility] ORS;  Service: Vascular;  Laterality: Right;  . BACK SURGERY    . CARDIAC CATHETERIZATION    . CATARACT EXTRACTION W/ INTRAOCULAR LENS  IMPLANT, BILATERAL Bilateral   . CHOLECYSTECTOMY N/A 04/29/2016   Procedure: LAPAROSCOPIC CHOLECYSTECTOMY WITH INTRAOPERATIVE CHOLANGIOGRAM;  Surgeon: Alphonsa Overall, MD;  Location: [facility];  Service: General;  Laterality: N/A;  . EP  IMPLANTABLE DEVICE N/A 11/08/2015   Procedure: Pacemaker Implant;  Surgeon: Deboraha Sprang, MD;  Location: [facility];  Service: Cardiovascular;  Laterality: N/A;  . INSERT / REPLACE / REMOVE PACEMAKER  2017   Dr. Jens Som [facility])  . JOINT REPLACEMENT    . LUMBAR DISC SURGERY  1990's   x 2  . THYROIDECTOMY  2011  . TOTAL KNEE ARTHROPLASTY  07/27/2011   Procedure: TOTAL KNEE ARTHROPLASTY;  Surgeon: Mauri Pole, MD;  Location: [facility] OR[facility];  Service: Orthopedics;  Laterality: Left;  . UPPER ESOPHAGEAL ENDOSCOPIC ULTRASOUND (EUS) N/A 05/14/2016   Procedure: UPPER ESOPHAGEAL ENDOSCOPIC ULTRASOUND (EUS);  Surgeon: Melene Plan  Ardis Hughs, MD;  Location: [facility];  Service: Endoscopy;  Laterality: N/A;  radial only-will be done mod if needed    Current Outpatient Medications  Medication Sig Dispense Refill  . acetaminophen (TYLENOL) 500 MG tablet Take 1,000 mg by mouth every 6 (six) hours as needed for moderate pain (for headaches).     Marland Kitchen albuterol (PROVENTIL) (2.5 MG/3ML) 0.083% nebulizer solution Take 2.5 mg by nebulization every 2 (two) hours as needed for wheezing or shortness of breath.     Marland Kitchen albuterol (VENTOLIN HFA) 108 (90 Base) MCG/ACT inhaler Inhale 2 puffs into the lungs every 6 (six) hours as needed for wheezing or shortness of breath. 3 Inhaler 3  . allopurinol (ZYLOPRIM) 100 MG tablet Take 100 mg by mouth 2 (two) times daily.     Marland Kitchen amLODipine (NORVASC) 5 MG tablet Take 1 tablet (5 mg total) by mouth daily. 90 tablet 3  . apixaban (ELIQUIS) 5 MG TABS tablet Take 1 tablet (5 mg total) by mouth 2 (two) times daily. 180 tablet 2  . atorvastatin (LIPITOR) 40 MG tablet Take 1 tablet (40 mg total) by mouth daily. 90 tablet 3  . budesonide-formoterol (SYMBICORT) 160-4.5 MCG/ACT inhaler Inhale 2 puffs into the lungs 2 (two) times daily for 1 day. 3 Inhaler 3  . Cholecalciferol (VITAMIN D-3 PO) Take 1 capsule by mouth daily with breakfast.    . Dextromethorphan-guaiFENesin (TUSSIN DM MAX  SUGAR-FREE PO) Take 5 mLs by mouth every 4 (four) hours as needed (for coughing).    . fluticasone (FLONASE) 50 MCG/ACT nasal spray USE 2 SPRAYS NASALLY DAILY 48 g 1  . gabapentin (NEURONTIN) 800 MG tablet Take 800 mg by mouth See admin instructions. Take 800 mg by mouth three to four times a day    . HYDROcodone-acetaminophen (NORCO/VICODIN) 5-325 MG tablet Take 1-2 tablets by mouth every 6 (six) hours as needed for moderate pain. 30 tablet 0  . insulin aspart (NOVOLOG FLEXPEN) 100 UNIT/ML FlexPen Inject 8-13 Units into the skin See admin instructions. Inject 8-13 units into the skin three times a day with meals, per sliding scale (and an additional 1 unit for every 50 points for a BGL >130)    . insulin glargine (LANTUS) 100 UNIT/ML injection Inject 130 Units into the skin in the morning and at bedtime.     Marland Kitchen levothyroxine (SYNTHROID) 175 MCG tablet Take 175 mcg by mouth daily before breakfast.     . LORazepam (ATIVAN) 2 MG tablet Take 2 mg by mouth at bedtime as needed for anxiety.     Marland Kitchen losartan (COZAAR) 100 MG tablet Take 100 mg by mouth daily.     . metoprolol tartrate (LOPRESSOR) 50 MG tablet Take 50 mg by mouth in the morning and at bedtime.    . nitroGLYCERIN (NITROSTAT) 0.4 MG SL tablet Place 1 tablet (0.4 mg total) under the tongue every 5 (five) minutes as needed for chest pain. 25 tablet 4  . PRESCRIPTION MEDICATION CPAP: At bedtime and "as needed for shortness of breath"    . promethazine (PHENADOZ) 25 MG suppository Place 25 mg rectally daily as needed (for headaches).     . silver sulfADIAZINE (SILVADENE) 1 % cream Apply pea-sized amount to wound daily. 50 g 0  . Tiotropium Bromide Monohydrate (SPIRIVA RESPIMAT) 2.5 MCG/ACT AERS Inhale 2 puffs into the lungs daily. 8 g 0  . torsemide (DEMADEX) 20 MG tablet Take 1 tablet (20 mg total) by mouth 2 (two) times daily. 60 tablet 3  .  TRUE METRIX BLOOD GLUCOSE TEST test strip     . vitamin B-12 (CYANOCOBALAMIN) 1000 MCG tablet Take 1,000  mcg by mouth daily.    . Budeson-Glycopyrrol-Formoterol (BREZTRI AEROSPHERE) 160-9-4.8 MCG/ACT AERO Inhale 2 puffs into the lungs 2 (two) times daily. (Patient not taking: Reported on 10/25/2019) 5.9 g 0  . Magnesium Oxide 400 (240 Mg) MG TABS Take 1 tablet (400 mg total) by mouth daily. 30 tablet 3   No current facility-administered medications for this encounter.    Allergies  Allergen Reactions  . Dulaglutide Nausea Only, Other (See Comments) and Cough    Made the patient sick to her stomach (only) several days after using it    Social History   Socioeconomic History  . Marital status: Married    Spouse name: Jeneen Rinks  . Number of children: 3  . Years of education: 4  . Highest education level: Not on file  Occupational History  . Occupation: retired  Tobacco Use  . Smoking status: Former Smoker    Packs/day: 2.00    Years: 33.00    Pack years: 66.00    Types: Cigarettes    Start date: 1966    Quit date: 05/04/1997    Years since quitting: 22.4  . Smokeless tobacco: Never Used  Vaping Use  . Vaping Use: Never used  Substance and Sexual Activity  . Alcohol use: No  . Drug use: No  . Sexual activity: Not on file  Other Topics Concern  . Not on file  Social History Narrative   Patient lives at home with her husband Jeneen Rinks .   Retired.   Education 12 th grade.   Right handed   Caffeine two cups of coffee.   Social Determinants of Health   Financial Resource Strain:   . Difficulty of Paying Living Expenses:   Food Insecurity:   . Worried About Charity fundraiser in the Last Year:   . Arboriculturist in the Last Year:   Transportation Needs:   . Film/video editor (Medical):   Marland Kitchen Lack of Transportation (Non-Medical):   Physical Activity:   . Days of Exercise per Week:   . Minutes of Exercise per Session:   Stress:   . Feeling of Stress :   Social Connections:   . Frequency of Communication with Friends and Family:   . Frequency of Social Gatherings with  Friends and Family:   . Attends Religious Services:   . Active Member of Clubs or Organizations:   . Attends Archivist Meetings:   Marland Kitchen Marital Status:   Intimate Partner Violence:   . Fear of Current or Ex-Partner:   . Emotionally Abused:   Marland Kitchen Physically Abused:   . Sexually Abused:      ROS- All systems are reviewed and negative except as per the HPI above.  Physical Exam: Vitals:   10/25/19 1435  BP: (!) 120/50  Pulse: 60  Weight: 108.2 kg  Height: 5\' 6"  (1.676 m)    GEN- The patient is well appearing obese elderly female, alert and oriented x 3 today.   HEENT-head normocephalic, atraumatic, sclera clear, conjunctiva pink, hearing intact, trachea midline. Lungs- Clear to ausculation bilaterally, normal work of breathing Heart- Regular rate and rhythm, no murmurs, rubs or gallops  GI- soft, NT, ND, + BS Extremities- no clubbing, cyanosis, or edema MS- no significant deformity or atrophy Skin- no rash or lesion Psych- euthymic mood, full affect Neuro- strength and sensation are intact  Wt Readings from Last 3 Encounters:  10/25/19 108.2 kg  10/17/19 108.4 kg  10/10/19 107.5 kg    EKG today demonstrates A paced with prolonged conduction HR 60, PR 234, QRS 80, QTc 430  Echo 08/13/19 demonstrated  1. Left ventricular ejection fraction, by estimation, is 65 to 70%. The  left ventricle has normal function. The left ventricle has no regional  wall motion abnormalities. There is moderate concentric left ventricular  hypertrophy. Unable to assess LV  diastolic filling due to underlying atrial fibrillation.  2. Right ventricular systolic function is normal. The right ventricular  size is normal. There is normal pulmonary artery systolic pressure.  3. Right atrial size was moderately dilated.  4. The mitral valve is normal in structure. No evidence of mitral valve  regurgitation. No evidence of mitral stenosis.  5. The aortic valve is tricuspid. Aortic valve  regurgitation is not  visualized. Mild aortic valve sclerosis is present, with no evidence of  aortic valve stenosis.  6. The inferior vena cava is dilated in size with >50% respiratory  variability, suggesting right atrial pressure of 8 mmHg.   Epic records are reviewed at length today  Assessment and Plan:  1. Paroxysmal atrial fibrillation Patient appears to be having more frequent episodes of afib. Suspicion that this is contributing to her diastolic dysfunction/fluid retention.  We discussed AAD therapy today.  Patient wants to pursue dofetilide. She will check on the price of the medication.  PharmD to screen drugs for QT prolonging agents.  QTc in SR 430 ms Continue Lopressor 50 mg BID Continue Eliquis 5 mg BID  This patients CHA2DS2-VASc Score and unadjusted Ischemic Stroke Rate (% per year) is equal to 9.7 % stroke rate/year from a score of 6  Above score calculated as 1 point each if present [CHF, HTN, DM, Vascular=MI/PAD/Aortic Plaque, Age if 65-74, or Female] Above score calculated as 2 points each if present [Age > 75, or Stroke/TIA/TE]  2. Obesity Body mass index is 38.51 kg/m. Lifestyle modification was discussed and encouraged including regular physical activity and weight reduction.  3. Obstructive sleep apnea Patient compliant with CPAP therapy.  4. CAD Cardiac CTA 12/31/17 with diffuse plaque with moderate disease in RCA and LAD. FFR showed stenosis in distal OM2.  No anginal symptoms.  5. Chronic diastolic CHF Weight stable. No signs or symptoms of fluid overload today.   6. HTN Stable, no changes today.  7. Sinus node dysfunction S/p PPM, followed by Dr Caryl Comes and the device clinic.   Follow up for dofetilide admission.    [facility] [address] [place], [place] [address] [phone] 10/25/2019 5:03 PM

## 2019-10-26 ENCOUNTER — Telehealth: Payer: Self-pay | Admitting: Pulmonary Disease

## 2019-10-26 NOTE — Telephone Encounter (Signed)
Spoke with pt, she states she doesn't like the [place] and would like to go back on Symbicort and Spiriva. It doesn't work as well and she is having more SOB. VS can we switch back to Symbicort and Spiriva.

## 2019-10-27 ENCOUNTER — Other Ambulatory Visit (HOSPITAL_COMMUNITY)
Admission: RE | Admit: 2019-10-27 | Discharge: 2019-10-27 | Disposition: A | Discharge status: Home / Self Care | Payer: Medicare HMO | Source: Ambulatory Visit | Attending: Physician Assistant | Admitting: Physician Assistant

## 2019-10-27 DIAGNOSIS — Z01812 Encounter for preprocedural laboratory examination: Secondary | ICD-10-CM | POA: Insufficient documentation

## 2019-10-27 DIAGNOSIS — Z9842 Cataract extraction status, left eye: Secondary | ICD-10-CM | POA: Diagnosis not present

## 2019-10-27 DIAGNOSIS — I130 Hypertensive heart and chronic kidney disease with heart failure and stage 1 through stage 4 chronic kidney disease, or unspecified chronic kidney disease: Secondary | ICD-10-CM | POA: Diagnosis present

## 2019-10-27 DIAGNOSIS — Z20822 Contact with and (suspected) exposure to covid-19: Secondary | ICD-10-CM | POA: Diagnosis present

## 2019-10-27 DIAGNOSIS — E890 Postprocedural hypothyroidism: Secondary | ICD-10-CM | POA: Diagnosis present

## 2019-10-27 DIAGNOSIS — Z794 Long term (current) use of insulin: Secondary | ICD-10-CM | POA: Diagnosis not present

## 2019-10-27 DIAGNOSIS — Z9049 Acquired absence of other specified parts of digestive tract: Secondary | ICD-10-CM | POA: Diagnosis not present

## 2019-10-27 DIAGNOSIS — Z7901 Long term (current) use of anticoagulants: Secondary | ICD-10-CM | POA: Diagnosis not present

## 2019-10-27 DIAGNOSIS — Z950 Presence of cardiac pacemaker: Secondary | ICD-10-CM | POA: Diagnosis not present

## 2019-10-27 DIAGNOSIS — I480 Paroxysmal atrial fibrillation: Secondary | ICD-10-CM | POA: Diagnosis not present

## 2019-10-27 DIAGNOSIS — G4733 Obstructive sleep apnea (adult) (pediatric): Secondary | ICD-10-CM | POA: Diagnosis present

## 2019-10-27 DIAGNOSIS — N183 Chronic kidney disease, stage 3 unspecified: Secondary | ICD-10-CM | POA: Diagnosis present

## 2019-10-27 DIAGNOSIS — Z981 Arthrodesis status: Secondary | ICD-10-CM | POA: Diagnosis not present

## 2019-10-27 DIAGNOSIS — E119 Type 2 diabetes mellitus without complications: Secondary | ICD-10-CM | POA: Diagnosis present

## 2019-10-27 DIAGNOSIS — Z90710 Acquired absence of both cervix and uterus: Secondary | ICD-10-CM | POA: Diagnosis not present

## 2019-10-27 DIAGNOSIS — Z888 Allergy status to other drugs, medicaments and biological substances status: Secondary | ICD-10-CM | POA: Diagnosis not present

## 2019-10-27 DIAGNOSIS — Z961 Presence of intraocular lens: Secondary | ICD-10-CM | POA: Diagnosis present

## 2019-10-27 DIAGNOSIS — Z9841 Cataract extraction status, right eye: Secondary | ICD-10-CM | POA: Diagnosis not present

## 2019-10-27 DIAGNOSIS — E1122 Type 2 diabetes mellitus with diabetic chronic kidney disease: Secondary | ICD-10-CM | POA: Diagnosis present

## 2019-10-27 DIAGNOSIS — Z7951 Long term (current) use of inhaled steroids: Secondary | ICD-10-CM | POA: Diagnosis not present

## 2019-10-27 DIAGNOSIS — Z96652 Presence of left artificial knee joint: Secondary | ICD-10-CM | POA: Diagnosis present

## 2019-10-27 DIAGNOSIS — I5032 Chronic diastolic (congestive) heart failure: Secondary | ICD-10-CM | POA: Diagnosis present

## 2019-10-27 DIAGNOSIS — I2510 Atherosclerotic heart disease of native coronary artery without angina pectoris: Secondary | ICD-10-CM | POA: Diagnosis present

## 2019-10-27 DIAGNOSIS — J449 Chronic obstructive pulmonary disease, unspecified: Secondary | ICD-10-CM | POA: Diagnosis present

## 2019-10-27 DIAGNOSIS — K219 Gastro-esophageal reflux disease without esophagitis: Secondary | ICD-10-CM | POA: Diagnosis present

## 2019-10-27 LAB — SARS CORONAVIRUS 2 (TAT 6-24 HRS): SARS Coronavirus 2: NEGATIVE

## 2019-10-27 NOTE — Addendum Note (Signed)
Addended by: Lanna Poche R on: 10/27/2019 12:11 PM   Modules accepted: Orders

## 2019-10-30 ENCOUNTER — Encounter (HOSPITAL_COMMUNITY): Payer: Self-pay | Admitting: Internal Medicine

## 2019-10-30 ENCOUNTER — Encounter (HOSPITAL_COMMUNITY): Payer: Self-pay | Admitting: Physician Assistant

## 2019-10-30 ENCOUNTER — Inpatient Hospital Stay (HOSPITAL_COMMUNITY)
Admission: AD | Admit: 2019-10-30 | Discharge: 2019-11-02 | DRG: 309 | Disposition: A | Discharge status: Home / Self Care | Payer: Medicare HMO | Source: Ambulatory Visit | Attending: Internal Medicine | Admitting: Internal Medicine

## 2019-10-30 ENCOUNTER — Ambulatory Visit (HOSPITAL_COMMUNITY)
Admission: RE | Admit: 2019-10-30 | Discharge: 2019-10-30 | Disposition: A | Discharge status: Home / Self Care | Payer: Medicare HMO | Source: Ambulatory Visit | Attending: Physician Assistant | Admitting: Physician Assistant

## 2019-10-30 ENCOUNTER — Other Ambulatory Visit: Payer: Self-pay

## 2019-10-30 VITALS — BP 130/82 | HR 67 | Ht 66.0 in | Wt 236.6 lb

## 2019-10-30 DIAGNOSIS — E890 Postprocedural hypothyroidism: Secondary | ICD-10-CM | POA: Diagnosis present

## 2019-10-30 DIAGNOSIS — D6869 Other thrombophilia: Secondary | ICD-10-CM

## 2019-10-30 DIAGNOSIS — G4733 Obstructive sleep apnea (adult) (pediatric): Secondary | ICD-10-CM | POA: Diagnosis present

## 2019-10-30 DIAGNOSIS — J449 Chronic obstructive pulmonary disease, unspecified: Secondary | ICD-10-CM | POA: Diagnosis present

## 2019-10-30 DIAGNOSIS — E785 Hyperlipidemia, unspecified: Secondary | ICD-10-CM | POA: Diagnosis present

## 2019-10-30 DIAGNOSIS — N183 Chronic kidney disease, stage 3 unspecified: Secondary | ICD-10-CM | POA: Diagnosis present

## 2019-10-30 DIAGNOSIS — Z950 Presence of cardiac pacemaker: Secondary | ICD-10-CM | POA: Diagnosis not present

## 2019-10-30 DIAGNOSIS — M1990 Unspecified osteoarthritis, unspecified site: Secondary | ICD-10-CM | POA: Diagnosis present

## 2019-10-30 DIAGNOSIS — Z9841 Cataract extraction status, right eye: Secondary | ICD-10-CM

## 2019-10-30 DIAGNOSIS — Z9842 Cataract extraction status, left eye: Secondary | ICD-10-CM

## 2019-10-30 DIAGNOSIS — Z7951 Long term (current) use of inhaled steroids: Secondary | ICD-10-CM | POA: Diagnosis not present

## 2019-10-30 DIAGNOSIS — I272 Pulmonary hypertension, unspecified: Secondary | ICD-10-CM | POA: Diagnosis present

## 2019-10-30 DIAGNOSIS — I130 Hypertensive heart and chronic kidney disease with heart failure and stage 1 through stage 4 chronic kidney disease, or unspecified chronic kidney disease: Secondary | ICD-10-CM | POA: Diagnosis present

## 2019-10-30 DIAGNOSIS — Z981 Arthrodesis status: Secondary | ICD-10-CM

## 2019-10-30 DIAGNOSIS — I2510 Atherosclerotic heart disease of native coronary artery without angina pectoris: Secondary | ICD-10-CM | POA: Diagnosis present

## 2019-10-30 DIAGNOSIS — E119 Type 2 diabetes mellitus without complications: Secondary | ICD-10-CM | POA: Diagnosis present

## 2019-10-30 DIAGNOSIS — Z20822 Contact with and (suspected) exposure to covid-19: Secondary | ICD-10-CM | POA: Diagnosis present

## 2019-10-30 DIAGNOSIS — Z87891 Personal history of nicotine dependence: Secondary | ICD-10-CM

## 2019-10-30 DIAGNOSIS — Z8719 Personal history of other diseases of the digestive system: Secondary | ICD-10-CM

## 2019-10-30 DIAGNOSIS — I480 Paroxysmal atrial fibrillation: Secondary | ICD-10-CM

## 2019-10-30 DIAGNOSIS — Z961 Presence of intraocular lens: Secondary | ICD-10-CM | POA: Diagnosis present

## 2019-10-30 DIAGNOSIS — E669 Obesity, unspecified: Secondary | ICD-10-CM | POA: Diagnosis present

## 2019-10-30 DIAGNOSIS — Z6838 Body mass index (BMI) 38.0-38.9, adult: Secondary | ICD-10-CM

## 2019-10-30 DIAGNOSIS — Z79899 Other long term (current) drug therapy: Secondary | ICD-10-CM

## 2019-10-30 DIAGNOSIS — Z888 Allergy status to other drugs, medicaments and biological substances status: Secondary | ICD-10-CM

## 2019-10-30 DIAGNOSIS — Z96652 Presence of left artificial knee joint: Secondary | ICD-10-CM | POA: Diagnosis present

## 2019-10-30 DIAGNOSIS — Z90710 Acquired absence of both cervix and uterus: Secondary | ICD-10-CM

## 2019-10-30 DIAGNOSIS — I5032 Chronic diastolic (congestive) heart failure: Secondary | ICD-10-CM | POA: Diagnosis present

## 2019-10-30 DIAGNOSIS — K219 Gastro-esophageal reflux disease without esophagitis: Secondary | ICD-10-CM | POA: Diagnosis present

## 2019-10-30 DIAGNOSIS — Z9049 Acquired absence of other specified parts of digestive tract: Secondary | ICD-10-CM | POA: Diagnosis not present

## 2019-10-30 DIAGNOSIS — Z794 Long term (current) use of insulin: Secondary | ICD-10-CM

## 2019-10-30 DIAGNOSIS — F419 Anxiety disorder, unspecified: Secondary | ICD-10-CM | POA: Diagnosis present

## 2019-10-30 DIAGNOSIS — E1122 Type 2 diabetes mellitus with diabetic chronic kidney disease: Secondary | ICD-10-CM | POA: Diagnosis present

## 2019-10-30 DIAGNOSIS — I495 Sick sinus syndrome: Secondary | ICD-10-CM | POA: Diagnosis present

## 2019-10-30 DIAGNOSIS — Z7901 Long term (current) use of anticoagulants: Secondary | ICD-10-CM

## 2019-10-30 LAB — BASIC METABOLIC PANEL
Anion gap: 18 — ABNORMAL HIGH (ref 5–15)
BUN: 49 mg/dL — ABNORMAL HIGH (ref 8–23)
CO2: 19 mmol/L — ABNORMAL LOW (ref 22–32)
Calcium: 9.2 mg/dL (ref 8.9–10.3)
Chloride: 101 mmol/L (ref 98–111)
Creatinine, Ser: 1.68 mg/dL — ABNORMAL HIGH (ref 0.44–1.00)
GFR calc Af Amer: 33 mL/min — ABNORMAL LOW (ref >60)
GFR calc non Af Amer: 29 mL/min — ABNORMAL LOW (ref >60)
Glucose, Bld: 295 mg/dL — ABNORMAL HIGH (ref 70–99)
Potassium: 4.7 mmol/L (ref 3.5–5.1)
Sodium: 138 mmol/L (ref 135–145)

## 2019-10-30 LAB — HEMOGLOBIN A1C
Hgb A1c MFr Bld: 8.9 % — ABNORMAL HIGH (ref 4.8–5.6)
Mean Plasma Glucose: 208.73 mg/dL

## 2019-10-30 LAB — MAGNESIUM
Magnesium: 1.5 mg/dL — ABNORMAL LOW (ref 1.7–2.4)
Magnesium: 2.8 mg/dL — ABNORMAL HIGH (ref 1.7–2.4)

## 2019-10-30 LAB — GLUCOSE, CAPILLARY
Glucose-Capillary: 185 mg/dL — ABNORMAL HIGH (ref 70–99)
Glucose-Capillary: 313 mg/dL — ABNORMAL HIGH (ref 70–99)

## 2019-10-30 MED ORDER — AMLODIPINE BESYLATE 5 MG PO TABS
5.0000 mg | ORAL_TABLET | Freq: Every day | ORAL | Status: DC
  Administered 2019-10-31 – 2019-11-02 (×3): 5 mg via ORAL
  Filled 2019-10-30 (×3): qty 1

## 2019-10-30 MED ORDER — GABAPENTIN 400 MG PO CAPS
800.0000 mg | ORAL_CAPSULE | Freq: Three times a day (TID) | ORAL | Status: DC
  Administered 2019-10-30 – 2019-11-02 (×9): 800 mg via ORAL
  Filled 2019-10-30 (×9): qty 2

## 2019-10-30 MED ORDER — LOSARTAN POTASSIUM 50 MG PO TABS
100.0000 mg | ORAL_TABLET | Freq: Every day | ORAL | Status: DC
  Administered 2019-10-31 – 2019-11-02 (×3): 100 mg via ORAL
  Filled 2019-10-30 (×3): qty 2

## 2019-10-30 MED ORDER — LORAZEPAM 1 MG PO TABS: 2.0000 mg | ORAL_TABLET | Freq: Every evening | ORAL | Status: DC | PRN

## 2019-10-30 MED ORDER — MAGNESIUM SULFATE 4 GM/100ML IV SOLN
4.0000 g | INTRAVENOUS | Status: AC
  Administered 2019-10-30: 4 g via INTRAVENOUS
  Filled 2019-10-30: qty 100

## 2019-10-30 MED ORDER — MAGNESIUM OXIDE 400 (241.3 MG) MG PO TABS
400.0000 mg | ORAL_TABLET | Freq: Every day | ORAL | Status: DC
  Administered 2019-10-31 – 2019-11-02 (×3): 400 mg via ORAL
  Filled 2019-10-30 (×5): qty 1

## 2019-10-30 MED ORDER — INSULIN ASPART 100 UNIT/ML ~~LOC~~ SOLN
0.0000 [IU] | Freq: Three times a day (TID) | SUBCUTANEOUS | Status: DC
  Administered 2019-10-30 – 2019-11-01 (×5): 4 [IU] via SUBCUTANEOUS
  Administered 2019-11-01 – 2019-11-02 (×2): 7 [IU] via SUBCUTANEOUS
  Administered 2019-11-02: 3 [IU] via SUBCUTANEOUS

## 2019-10-30 MED ORDER — SODIUM CHLORIDE 0.9% FLUSH: 3.0000 mL | INTRAVENOUS | Status: DC | PRN

## 2019-10-30 MED ORDER — SPIRIVA RESPIMAT 2.5 MCG/ACT IN AERS: 2.0000 | INHALATION_SPRAY | Freq: Every day | RESPIRATORY_TRACT | 3 refills | Status: DC

## 2019-10-30 MED ORDER — SODIUM CHLORIDE 0.9% FLUSH
3.0000 mL | Freq: Two times a day (BID) | INTRAVENOUS | Status: DC
  Administered 2019-10-30 – 2019-11-02 (×5): 3 mL via INTRAVENOUS

## 2019-10-30 MED ORDER — BUDESONIDE-FORMOTEROL FUMARATE 160-4.5 MCG/ACT IN AERO: 2.0000 | INHALATION_SPRAY | Freq: Two times a day (BID) | RESPIRATORY_TRACT | 3 refills | Status: DC

## 2019-10-30 MED ORDER — UMECLIDINIUM BROMIDE 62.5 MCG/INH IN AEPB
1.0000 | INHALATION_SPRAY | Freq: Every day | RESPIRATORY_TRACT | Status: DC
  Administered 2019-10-31 – 2019-11-02 (×3): 1 via RESPIRATORY_TRACT
  Filled 2019-10-30: qty 7

## 2019-10-30 MED ORDER — TIOTROPIUM BROMIDE MONOHYDRATE 2.5 MCG/ACT IN AERS: 2.0000 | INHALATION_SPRAY | Freq: Every day | RESPIRATORY_TRACT | Status: DC

## 2019-10-30 MED ORDER — INSULIN ASPART 100 UNIT/ML FLEXPEN: 8.0000 [IU] | PEN_INJECTOR | SUBCUTANEOUS | Status: DC

## 2019-10-30 MED ORDER — METOPROLOL TARTRATE 50 MG PO TABS
50.0000 mg | ORAL_TABLET | Freq: Two times a day (BID) | ORAL | Status: DC
  Administered 2019-10-30 – 2019-11-02 (×6): 50 mg via ORAL
  Filled 2019-10-30 (×6): qty 1

## 2019-10-30 MED ORDER — ACETAMINOPHEN 500 MG PO TABS
1000.0000 mg | ORAL_TABLET | Freq: Four times a day (QID) | ORAL | Status: DC | PRN
  Administered 2019-10-31 – 2019-11-02 (×4): 1000 mg via ORAL
  Filled 2019-10-30 (×4): qty 2

## 2019-10-30 MED ORDER — FLUTICASONE PROPIONATE 50 MCG/ACT NA SUSP
2.0000 | Freq: Every day | NASAL | Status: DC | PRN
  Administered 2019-11-01: 2 via NASAL
  Filled 2019-10-30: qty 16

## 2019-10-30 MED ORDER — LEVOTHYROXINE SODIUM 75 MCG PO TABS
175.0000 ug | ORAL_TABLET | Freq: Every day | ORAL | Status: DC
  Administered 2019-10-31 – 2019-11-02 (×3): 175 ug via ORAL
  Filled 2019-10-30 (×3): qty 1

## 2019-10-30 MED ORDER — ATORVASTATIN CALCIUM 40 MG PO TABS
40.0000 mg | ORAL_TABLET | Freq: Every day | ORAL | Status: DC
  Administered 2019-10-31 – 2019-11-02 (×3): 40 mg via ORAL
  Filled 2019-10-30 (×3): qty 1

## 2019-10-30 MED ORDER — SODIUM CHLORIDE 0.9 % IV SOLN: 250.0000 mL | INTRAVENOUS | Status: DC | PRN

## 2019-10-30 MED ORDER — GABAPENTIN 400 MG PO CAPS
800.0000 mg | ORAL_CAPSULE | Freq: Every evening | ORAL | Status: DC | PRN
  Administered 2019-10-31 – 2019-11-01 (×2): 800 mg via ORAL
  Filled 2019-10-30 (×2): qty 2

## 2019-10-30 MED ORDER — ALBUTEROL SULFATE (2.5 MG/3ML) 0.083% IN NEBU: 3.0000 mL | INHALATION_SOLUTION | Freq: Four times a day (QID) | RESPIRATORY_TRACT | Status: DC | PRN

## 2019-10-30 MED ORDER — ALLOPURINOL 100 MG PO TABS
100.0000 mg | ORAL_TABLET | Freq: Two times a day (BID) | ORAL | Status: DC
  Administered 2019-10-30 – 2019-11-02 (×6): 100 mg via ORAL
  Filled 2019-10-30 (×6): qty 1

## 2019-10-30 MED ORDER — HYDROCODONE-ACETAMINOPHEN 5-325 MG PO TABS
1.0000 | ORAL_TABLET | Freq: Four times a day (QID) | ORAL | Status: DC | PRN
  Administered 2019-10-30 – 2019-11-01 (×6): 1 via ORAL
  Filled 2019-10-30 (×2): qty 1
  Filled 2019-10-30: qty 2
  Filled 2019-10-30 (×3): qty 1

## 2019-10-30 MED ORDER — INSULIN GLARGINE 100 UNIT/ML ~~LOC~~ SOLN
130.0000 [IU] | Freq: Two times a day (BID) | SUBCUTANEOUS | Status: DC
  Administered 2019-10-30 – 2019-11-02 (×6): 130 [IU] via SUBCUTANEOUS
  Filled 2019-10-30 (×9): qty 1.3

## 2019-10-30 MED ORDER — MOMETASONE FURO-FORMOTEROL FUM 200-5 MCG/ACT IN AERO
2.0000 | INHALATION_SPRAY | Freq: Two times a day (BID) | RESPIRATORY_TRACT | Status: DC
  Administered 2019-10-30 – 2019-11-02 (×6): 2 via RESPIRATORY_TRACT
  Filled 2019-10-30: qty 8.8

## 2019-10-30 MED ORDER — APIXABAN 5 MG PO TABS
5.0000 mg | ORAL_TABLET | Freq: Two times a day (BID) | ORAL | Status: DC
  Administered 2019-10-30 – 2019-11-02 (×6): 5 mg via ORAL
  Filled 2019-10-30 (×6): qty 1

## 2019-10-30 MED ORDER — GABAPENTIN 400 MG PO CAPS: 800.0000 mg | ORAL_CAPSULE | Freq: Three times a day (TID) | ORAL | Status: DC

## 2019-10-30 MED ORDER — TORSEMIDE 20 MG PO TABS
20.0000 mg | ORAL_TABLET | Freq: Two times a day (BID) | ORAL | Status: DC
  Administered 2019-10-30 – 2019-11-02 (×6): 20 mg via ORAL
  Filled 2019-10-30 (×6): qty 1

## 2019-10-30 MED ORDER — DOFETILIDE 250 MCG PO CAPS
250.0000 ug | ORAL_CAPSULE | Freq: Two times a day (BID) | ORAL | Status: DC
  Administered 2019-10-30 – 2019-11-02 (×6): 250 ug via ORAL
  Filled 2019-10-30 (×6): qty 1

## 2019-10-30 NOTE — Progress Notes (Signed)
Pharmacy: Dofetilide (Tikosyn) - Initial Consult Assessment and Electrolyte Replacement  Pharmacy consulted to assist in monitoring and replacing electrolytes in this 79 y.o. female admitted on 10/30/2019 undergoing dofetilide initiation. First dofetilide dose: scheduled for 10/30/19.  Assessment:  Patient Exclusion Criteria: If any screening criteria checked as "Yes", then  patient  should NOT receive dofetilide until criteria item is corrected.  If "Yes" please indicate correction plan.  YES  NO Patient  Exclusion Criteria Correction Plan   []   []   Baseline QTc interval is greater than or equal to 440 msec. IF above YES box checked dofetilide contraindicated unless patient has ICD; then may proceed if QTc 500-550 msec or with known ventricular conduction abnormalities may proceed with QTc 550-600 msec. QTc = 419ms    []   [x]   Patient is known or suspected to have a digoxin level greater than 2 ng/ml: No results found for: DIGOXIN     []   [x]   Creatinine clearance less than 20 ml/min (calculated using Cockcroft-Gault, actual body weight and serum creatinine): Estimated Creatinine Clearance: 34.2 mL/min (A) (by C-G formula based on SCr of 1.68 mg/dL (H)).     []   [x]  Patient has received drugs known to prolong the QT intervals within the last 48 hours (phenothiazines, tricyclics or tetracyclic antidepressants, erythromycin, H-1 antihistamines, cisapride, fluoroquinolones, azithromycin). Updated information on QT prolonging agents is available to be searched on the following database:QT prolonging agents     []   [x]   Patient received a dose of hydrochlorothiazide ([place]) alone or in any combination including triamterene (Dyazide, Maxzide) in the last 48 hours.    []   [x]  Patient received a medication known to increase dofetilide plasma concentrations prior to initial dofetilide dose:  . Trimethoprim (Primsol, Proloprim) in the last 36 hours . Verapamil (Calan, Verelan) in the  last 36 hours or a sustained release dose in the last 72 hours . Megestrol (Megace) in the last 5 days  . Cimetidine (Tagamet) in the last 6 hours . Ketoconazole (Nizoral) in the last 24 hours . Itraconazole (Sporanox) in the last 48 hours  . Prochlorperazine (Compazine) in the last 36 hours     []   [x]   Patient is known to have a history of torsades de pointes; congenital or acquired long QT syndromes.    []   [x]   Patient has received a Class 1 antiarrhythmic with less than 2 half-lives since last dose. (Disopyramide, Quinidine, Procainamide, Lidocaine, Mexiletine, Flecainide, Propafenone)    []   [x]   Patient has received amiodarone therapy in the past 3 months or amiodarone level is greater than 0.3 ng/ml.    Patient has been appropriately anticoagulated with apixaban.  Labs:    Component Value Date/Time   K 4.7 10/30/2019 1124   MG 1.5 (L) 10/30/2019 1124     Plan: Potassium: K >/= 4: Appropriate to initiate Tikosyn, no replacement needed    Magnesium: Mg 1.3-1.7: Hold Tikosyn initiation and give Mg 4 gm x1 IV, recheck Mg 4hr after end of infusion - repeat appropriate dose if not > 1.8     Thank you for allowing pharmacy to participate in this patient's care   Erin Hearing PharmD., BCPS Clinical Pharmacist 10/30/2019 1:35 PM

## 2019-10-30 NOTE — H&P (Signed)
Primary Care Physician: [name], MD Primary Cardiologist: Dr Marlou Porch Primary Electrophysiologist: Dr Caryl Comes Referring Physician: Dr Chriss Czar is a 79 y.o. female with a history of paroxysmal atrial fibrillation, CAD, COPD, tobacco abuse, DM, GERD, HTN, hypothyroidism, and OSA who presents for follow up in the [facility]. Patient noted to have paroxysmal atrial fibrillation noted on remote device transmission. Longest episode was about 7 days. Ventricular rates appear controlled. Patient states that she "thought everything was going great" until the device clinic called. She does report that she may have increase SOB over the last few weeks but this is improving. There were no specific triggers that the patient could identify. Patient was hospitalized 08/2019 with acute on chronic CHF. She was noted to be in rate controlled afib at the time. Device interrogation shows an increased afib burden (38.9%) with overall controlled V rates. She continues to have symptoms of fatigue and fluid retention when she is in afib.   On follow up today, patient presents for dofetilide admission. Patient reports doing reasonably well since her last visit. She is back in afib today with symptoms of palpitations. She denies any missed doses of anticoagulation.   Today, she denies symptoms of chest pain, orthopnea, PND, dizziness, presyncope, syncope, bleeding, or neurologic sequela. The patient is tolerating medications without difficulties and is otherwise without complaint today.    Atrial Fibrillation Risk Factors:  she does have symptoms or diagnosis of sleep apnea. she is compliant with CPAP therapy.   she has a BMI of Body mass index is 38.19 kg/m.Marland Kitchen    Filed Weights   10/30/19 1132  Weight: 107.3 kg         Family History  Problem Relation Age of Onset  . Heart Problems Mother   . Heart defect Mother        valve problems  .  Heart Problems Father   . Heart disease Brother   . Lung cancer Daughter        non small cell   . Diabetes Maternal Aunt   . Diabetes Maternal Uncle   . Throat cancer Brother   . Colon cancer Neg Hx   . Colon polyps Neg Hx   . Esophageal cancer Neg Hx   . Rectal cancer Neg Hx   . Stomach cancer Neg Hx      Atrial Fibrillation Management history:  Previous antiarrhythmic drugs: none Previous cardioversions: none Previous ablations: none CHADS2VASC score: 6 Anticoagulation history: Eliquis        Past Medical History:  Diagnosis Date  . Allergy   . Anxiety   . Arthritis   . CAD (coronary artery disease)    Mild per remote cath in 2006, /    Nuclear, January, 2013, no significant abnormality,  . Carotid artery disease ([place])    Doppler, January, 2013, 0 11% RIC A., 91-47% LICA, stable  . Cataract   . CHF (congestive heart failure) ([place])   . Complication of anesthesia    wakes up "mean"  . COPD 02/16/2007   Intolerant of Spiriva, tudorza Intolerant of Trelegy   . COPD (chronic obstructive pulmonary disease) ([place])   . Coronary atherosclerosis 11/24/2008   Catheterization 2006, nonobstructive coronary disease   //   nuclear 2013, low risk    . Diabetes mellitus, type 2 ([place])   . Diverticula of colon   . DIVERTICULOSIS OF COLON 03/23/2007   Qualifier: Diagnosis of  By:  Sood MD, Vineet    . Dizziness    occasional  . Dizzy spells 05/04/2011  . DM (diabetes mellitus) ([place]) 03/23/2007   Qualifier: Diagnosis of  By: Halford Chessman MD, Vineet    . Ejection fraction   . Essential hypertension 02/16/2007   Qualifier: Diagnosis of  By: Rosana Hoes CMA, Tammy    . G E R D 03/23/2007   Qualifier: Diagnosis of  By: Halford Chessman MD, Vineet    . Gall bladder disease 04/28/2016  . Gastritis and gastroduodenitis   . GERD (gastroesophageal reflux disease)   . Goiter    hx of  . Headache(784.0)    migraine  . Hyperlipidemia   . Hypertension   .  Hypothyroidism   . HYPOTHYROIDISM, POSTSURGICAL 06/04/2008   Qualifier: Diagnosis of  By: Loanne Drilling MD, Jacelyn Pi   . Left ventricular hypertrophy   . Leg edema    occasional swelling  . Leg swelling 12/09/2018  . Myofascial pain syndrome, cervical 01/06/2018  . Obesity   . OBESITY 11/24/2008   Qualifier: Diagnosis of  By: Sidney Ace    . OBSTRUCTIVE SLEEP APNEA 02/16/2007   Auto CPAP 09/03/13 to 10/02/13 >> used on 30 of 30 nights with average 6 hrs and 3 min.  Average AHI is 3.1 with median CPAP 5 cm H2O and 95 th percentile CPAP 6 cm H2O.    . Occipital neuralgia of right side 01/06/2018  . OSA (obstructive sleep apnea)    cpap setting of 12  . Pain in joint of right hip 01/21/2018  . Palpitations 01/21/2011   48 hour Holter, 2015, scattered PACs and PVCs.   Marland Kitchen PONV (postoperative nausea and vomiting)    severe after every surgery  . Pulmonary hypertension, secondary   . RHINITIS 07/21/2010       . RUQ abdominal pain   . S/P left TKA 07/27/2011  . Sinus node dysfunction ([place]) 11/08/2015  . Sleep apnea   . Spinal stenosis of cervical region 11/07/2014  . Urinary frequency 12/09/2018  . UTI (urinary tract infection) 08/15/2019   per pt report  . VENTRICULAR HYPERTROPHY, LEFT 11/24/2008   Qualifier: Diagnosis of  By: Sidney Ace          Past Surgical History:  Procedure Laterality Date  . ABDOMINAL HYSTERECTOMY     with appendectomy and colon polypectomy  . ANTERIOR CERVICAL DECOMP/DISCECTOMY FUSION N/A 11/07/2014   Procedure: Anterior Cervical diskectomy and fusion Cervical four-five, Cervical five-six, Cervical six-seven;  Surgeon: Kary Kos, MD;  Location: [facility];  Service: Neurosurgery;  Laterality: N/A;  . APPENDECTOMY    . ARTERY BIOPSY Right 02/16/2017   Procedure: BIOPSY TEMPORAL ARTERY;  Surgeon: Katha Cabal, MD;  Location: [facility];  Service: Vascular;  Laterality: Right;  . BACK SURGERY    . CARDIAC CATHETERIZATION    . CATARACT  EXTRACTION W/ INTRAOCULAR LENS  IMPLANT, BILATERAL Bilateral   . CHOLECYSTECTOMY N/A 04/29/2016   Procedure: LAPAROSCOPIC CHOLECYSTECTOMY WITH INTRAOPERATIVE CHOLANGIOGRAM;  Surgeon: Alphonsa Overall, MD;  Location: [facility];  Service: General;  Laterality: N/A;  . EP IMPLANTABLE DEVICE N/A 11/08/2015   Procedure: Pacemaker Implant;  Surgeon: Deboraha Sprang, MD;  Location: [facility];  Service: Cardiovascular;  Laterality: N/A;  . INSERT / REPLACE / REMOVE PACEMAKER  2017   Dr. Jens Som [facility])  . JOINT REPLACEMENT    . LUMBAR DISC SURGERY  1990's   x 2  . THYROIDECTOMY  2011  . TOTAL KNEE ARTHROPLASTY  07/27/2011  Procedure: TOTAL KNEE ARTHROPLASTY;  Surgeon: Mauri Pole, MD;  Location: [facility];  Service: Orthopedics;  Laterality: Left;  . UPPER ESOPHAGEAL ENDOSCOPIC ULTRASOUND (EUS) N/A 05/14/2016   Procedure: UPPER ESOPHAGEAL ENDOSCOPIC ULTRASOUND (EUS);  Surgeon: Milus Banister, MD;  Location: [facility];  Service: Endoscopy;  Laterality: N/A;  radial only-will be done mod if needed          Current Outpatient Medications  Medication Sig Dispense Refill  . acetaminophen (TYLENOL) 500 MG tablet Take 1,000 mg by mouth every 6 (six) hours as needed for moderate pain (for headaches).     Marland Kitchen albuterol (PROVENTIL) (2.5 MG/3ML) 0.083% nebulizer solution Take 2.5 mg by nebulization every 2 (two) hours as needed for wheezing or shortness of breath.     Marland Kitchen albuterol (VENTOLIN HFA) 108 (90 Base) MCG/ACT inhaler Inhale 2 puffs into the lungs every 6 (six) hours as needed for wheezing or shortness of breath. 3 Inhaler 3  . allopurinol (ZYLOPRIM) 100 MG tablet Take 100 mg by mouth 2 (two) times daily.     Marland Kitchen amLODipine (NORVASC) 5 MG tablet Take 1 tablet (5 mg total) by mouth daily. 90 tablet 3  . apixaban (ELIQUIS) 5 MG TABS tablet Take 1 tablet (5 mg total) by mouth 2 (two) times daily. 180 tablet 2  . atorvastatin (LIPITOR) 40 MG tablet Take 1 tablet (40 mg total) by mouth  daily. 90 tablet 3  . budesonide-formoterol (SYMBICORT) 160-4.5 MCG/ACT inhaler Inhale 2 puffs into the lungs 2 (two) times daily for 1 day. 3 Inhaler 3  . Cholecalciferol (VITAMIN D-3 PO) Take 1 capsule by mouth daily with breakfast.    . Dextromethorphan-guaiFENesin (TUSSIN DM MAX SUGAR-FREE PO) Take 5 mLs by mouth every 4 (four) hours as needed (for coughing).    . fluticasone (FLONASE) 50 MCG/ACT nasal spray USE 2 SPRAYS NASALLY DAILY 48 g 1  . gabapentin (NEURONTIN) 800 MG tablet Take 800 mg by mouth See admin instructions. Take 800 mg by mouth three to four times a day    . HYDROcodone-acetaminophen (NORCO/VICODIN) 5-325 MG tablet Take 1-2 tablets by mouth every 6 (six) hours as needed for moderate pain. 30 tablet 0  . insulin aspart (NOVOLOG FLEXPEN) 100 UNIT/ML FlexPen Inject 8-13 Units into the skin See admin instructions. Inject 8-13 units into the skin three times a day with meals, per sliding scale (and an additional 1 unit for every 50 points for a BGL >130)    . insulin glargine (LANTUS) 100 UNIT/ML injection Inject 130 Units into the skin in the morning and at bedtime.     Marland Kitchen levothyroxine (SYNTHROID) 175 MCG tablet Take 175 mcg by mouth daily before breakfast.     . LORazepam (ATIVAN) 2 MG tablet Take 2 mg by mouth at bedtime as needed for anxiety.     Marland Kitchen losartan (COZAAR) 100 MG tablet Take 100 mg by mouth daily.     . Magnesium Oxide 400 (240 Mg) MG TABS Take 1 tablet (400 mg total) by mouth daily. 30 tablet 3  . metoprolol tartrate (LOPRESSOR) 50 MG tablet Take 50 mg by mouth in the morning and at bedtime.    . nitroGLYCERIN (NITROSTAT) 0.4 MG SL tablet Place 1 tablet (0.4 mg total) under the tongue every 5 (five) minutes as needed for chest pain. 25 tablet 4  . PRESCRIPTION MEDICATION CPAP: At bedtime and "as needed for shortness of breath"    . promethazine (PHENADOZ) 25 MG suppository Place 25 mg rectally daily  as needed (for headaches).     . silver  sulfADIAZINE (SILVADENE) 1 % cream Apply pea-sized amount to wound daily. 50 g 0  . Tiotropium Bromide Monohydrate (SPIRIVA RESPIMAT) 2.5 MCG/ACT AERS Inhale 2 puffs into the lungs daily. 12 g 3  . torsemide (DEMADEX) 20 MG tablet Take 1 tablet (20 mg total) by mouth 2 (two) times daily. 60 tablet 3  . TRUE METRIX BLOOD GLUCOSE TEST test strip     . vitamin B-12 (CYANOCOBALAMIN) 1000 MCG tablet Take 1,000 mcg by mouth daily.     No current facility-administered medications for this encounter.         Allergies  Allergen Reactions  . Dulaglutide Nausea Only, Other (See Comments) and Cough    Made the patient sick to her stomach (only) several days after using it    Social History        Socioeconomic History  . Marital status: Married    Spouse name: Jeneen Rinks  . Number of children: 3  . Years of education: 41  . Highest education level: Not on file  Occupational History  . Occupation: retired  Tobacco Use  . Smoking status: Former Smoker    Packs/day: 2.00    Years: 33.00    Pack years: 66.00    Types: Cigarettes    Start date: 1966    Quit date: 05/04/1997    Years since quitting: 22.5  . Smokeless tobacco: Never Used  Vaping Use  . Vaping Use: Never used  Substance and Sexual Activity  . Alcohol use: No  . Drug use: No  . Sexual activity: Not on file  Other Topics Concern  . Not on file  Social History Narrative   Patient lives at home with her husband Jeneen Rinks .   Retired.   Education 12 th grade.   Right handed   Caffeine two cups of coffee.   Social Determinants of Health      Financial Resource Strain:   . Difficulty of Paying Living Expenses:   Food Insecurity:   . Worried About Charity fundraiser in the Last Year:   . Arboriculturist in the Last Year:   Transportation Needs:   . Film/video editor (Medical):   Marland Kitchen Lack of Transportation (Non-Medical):   Physical Activity:   . Days of Exercise per Week:   . Minutes  of Exercise per Session:   Stress:   . Feeling of Stress :   Social Connections:   . Frequency of Communication with Friends and Family:   . Frequency of Social Gatherings with Friends and Family:   . Attends Religious Services:   . Active Member of Clubs or Organizations:   . Attends Archivist Meetings:   Marland Kitchen Marital Status:   Intimate Partner Violence:   . Fear of Current or Ex-Partner:   . Emotionally Abused:   Marland Kitchen Physically Abused:   . Sexually Abused:      ROS- All systems are reviewed and negative except as per the HPI above.  Physical Exam:    Vitals:   10/30/19 1132  BP: 130/82  Pulse: 67  Weight: 107.3 kg  Height: 5\' 6"  (1.676 m)    GEN- The patient is well appearing obese elderly female, alert and oriented x 3 today.   HEENT-head normocephalic, atraumatic, sclera clear, conjunctiva pink, hearing intact, trachea midline. Lungs- Clear to ausculation bilaterally, normal work of breathing Heart- irregular rate and rhythm, no murmurs, rubs or gallops  GI- soft, NT, ND, + BS Extremities- no clubbing, cyanosis, or edema MS- no significant deformity or atrophy Skin- no rash or lesion Psych- euthymic mood, full affect Neuro- strength and sensation are intact      Wt Readings from Last 3 Encounters:  10/30/19 107.3 kg  10/25/19 108.2 kg  10/17/19 108.4 kg    EKG today demonstrates afib HR 67, QRS 86, QTc 422  Echo 08/13/19 demonstrated  1. Left ventricular ejection fraction, by estimation, is 65 to 70%. The  left ventricle has normal function. The left ventricle has no regional  wall motion abnormalities. There is moderate concentric left ventricular  hypertrophy. Unable to assess LV  diastolic filling due to underlying atrial fibrillation.  2. Right ventricular systolic function is normal. The right ventricular  size is normal. There is normal pulmonary artery systolic pressure.  3. Right atrial size was moderately dilated.  4. The  mitral valve is normal in structure. No evidence of mitral valve  regurgitation. No evidence of mitral stenosis.  5. The aortic valve is tricuspid. Aortic valve regurgitation is not  visualized. Mild aortic valve sclerosis is present, with no evidence of  aortic valve stenosis.  6. The inferior vena cava is dilated in size with >50% respiratory  variability, suggesting right atrial pressure of 8 mmHg.   Epic records are reviewed at length today  Assessment and Plan:  1. Paroxysmal atrial fibrillation Patient appears to be having more frequent episodes of afib. Suspicion that this is contributing to her diastolic dysfunction/fluid retention.  Patient wants to pursue dofetilide, aware of risk vrs benefit Aware of price of dofetilide No benadryl use PharmD has screened drugs, no contraindicated medications. Will monitor QT closely on inhalers. QTc in SR 430 ms, Labs today show creatinine at 1.68, K+ 4.7 and mag 1.5, CrCl calculated at 47 mL/min. Mag to be replaced prior to first dose.   Continue Lopressor 50 mg BID Continue Eliquis 5 mg BID. Patient denies any missed doses in the last 3 weeks.   This patients CHA2DS2-VASc Score and unadjusted Ischemic Stroke Rate (% per year) is equal to 9.7 % stroke rate/year from a score of 6  Above score calculated as 1 point each if present [CHF, HTN, DM, Vascular=MI/PAD/Aortic Plaque, Age if 65-74, or Female] Above score calculated as 2 points each if present [Age > 75, or Stroke/TIA/TE]  2. Obesity Body mass index is 38.19 kg/m. Lifestyle modification was discussed and encouraged including regular physical activity and weight reduction.  3. Obstructive sleep apnea Patient compliant with CPAP therapy.  4. CAD Cardiac CTA 12/31/17 with diffuse plaque with moderate disease in RCA and LAD. FFR showed stenosis in distal OM2.  No anginal symptoms.  5. Chronic diastolic CHF No signs or symptoms of fluid overload today.  6.  HTN Stable, no changes today.  7. Sinus node dysfunction S/p PPM, followed by Dr Caryl Comes and the device clinic.    To be admitted later today once a bed becomes available.    [facility] [address] [place], [place] [address] [phone] 10/30/2019 11:59 AM         EP Attending  Patient seen and examined. Agree with above. The patient has been admitted with PAF to be initiated on dofetilide. Her QT has been reviewed and is satisfactory. He electrolytes will be repleted. She will undergo DCCV on 6/30 if she has not reverted back to NSR. She has not missed any doses of anti-coagulation  with Eliquis.   Cristopher Peru, M.D.

## 2019-10-30 NOTE — Telephone Encounter (Signed)
Okay to send order for spiriva respimat two puffs daily and symbicort 160/4.5 two puffs bid.  Please d/c breztri from her medication list.

## 2019-10-30 NOTE — Progress Notes (Signed)
Primary Care Physician: [name], MD Primary Cardiologist: Dr Marlou Porch Primary Electrophysiologist: Dr Caryl Comes Referring Physician: Dr Chriss Czar is a 79 y.o. female with a history of paroxysmal atrial fibrillation, CAD, COPD, tobacco abuse, DM, GERD, HTN, hypothyroidism, and OSA who presents for follow up in the [facility]. Patient noted to have paroxysmal atrial fibrillation noted on remote device transmission. Longest episode was about 7 days. Ventricular rates appear controlled. Patient states that she "thought everything was going great" until the device clinic called. She does report that she may have increase SOB over the last few weeks but this is improving. There were no specific triggers that the patient could identify. Patient was hospitalized 08/2019 with acute on chronic CHF. She was noted to be in rate controlled afib at the time. Device interrogation shows an increased afib burden (38.9%) with overall controlled V rates. She continues to have symptoms of fatigue and fluid retention when she is in afib.   On follow up today, patient presents for dofetilide admission. Patient reports doing reasonably well since her last visit. She is back in afib today with symptoms of palpitations. She denies any missed doses of anticoagulation.   Today, she denies symptoms of chest pain, orthopnea, PND, dizziness, presyncope, syncope, bleeding, or neurologic sequela. The patient is tolerating medications without difficulties and is otherwise without complaint today.    Atrial Fibrillation Risk Factors:  she does have symptoms or diagnosis of sleep apnea. she is compliant with CPAP therapy.   she has a BMI of Body mass index is 38.19 kg/m.Marland Kitchen Filed Weights   10/30/19 1132  Weight: 107.3 kg    Family History  Problem Relation Age of Onset  . Heart Problems Mother   . Heart defect Mother        valve problems  . Heart Problems Father   .  Heart disease Brother   . Lung cancer Daughter        non small cell   . Diabetes Maternal Aunt   . Diabetes Maternal Uncle   . Throat cancer Brother   . Colon cancer Neg Hx   . Colon polyps Neg Hx   . Esophageal cancer Neg Hx   . Rectal cancer Neg Hx   . Stomach cancer Neg Hx      Atrial Fibrillation Management history:  Previous antiarrhythmic drugs: none Previous cardioversions: none Previous ablations: none CHADS2VASC score: 6 Anticoagulation history: Eliquis    Past Medical History:  Diagnosis Date  . Allergy   . Anxiety   . Arthritis   . CAD (coronary artery disease)    Mild per remote cath in 2006, /    Nuclear, January, 2013, no significant abnormality,  . Carotid artery disease ([place])    Doppler, January, 2013, 0 80% RIC A., 32-12% LICA, stable  . Cataract   . CHF (congestive heart failure) ([place])   . Complication of anesthesia    wakes up "mean"  . COPD 02/16/2007   Intolerant of Spiriva, tudorza Intolerant of Trelegy   . COPD (chronic obstructive pulmonary disease) ([place])   . Coronary atherosclerosis 11/24/2008   Catheterization 2006, nonobstructive coronary disease   //   nuclear 2013, low risk    . Diabetes mellitus, type 2 ([place])   . Diverticula of colon   . DIVERTICULOSIS OF COLON 03/23/2007   Qualifier: Diagnosis of  By: Halford Chessman MD, Vineet    . Dizziness    occasional  .  Dizzy spells 05/04/2011  . DM (diabetes mellitus) ([place]) 03/23/2007   Qualifier: Diagnosis of  By: Halford Chessman MD, Vineet    . Ejection fraction   . Essential hypertension 02/16/2007   Qualifier: Diagnosis of  By: Rosana Hoes CMA, Tammy    . G E R D 03/23/2007   Qualifier: Diagnosis of  By: Halford Chessman MD, Vineet    . Gall bladder disease 04/28/2016  . Gastritis and gastroduodenitis   . GERD (gastroesophageal reflux disease)   . Goiter    hx of  . Headache(784.0)    migraine  . Hyperlipidemia   . Hypertension   . Hypothyroidism   . HYPOTHYROIDISM, POSTSURGICAL 06/04/2008   Qualifier: Diagnosis of   By: Loanne Drilling MD, Jacelyn Pi   . Left ventricular hypertrophy   . Leg edema    occasional swelling  . Leg swelling 12/09/2018  . Myofascial pain syndrome, cervical 01/06/2018  . Obesity   . OBESITY 11/24/2008   Qualifier: Diagnosis of  By: Sidney Ace    . OBSTRUCTIVE SLEEP APNEA 02/16/2007   Auto CPAP 09/03/13 to 10/02/13 >> used on 30 of 30 nights with average 6 hrs and 3 min.  Average AHI is 3.1 with median CPAP 5 cm H2O and 95 th percentile CPAP 6 cm H2O.    . Occipital neuralgia of right side 01/06/2018  . OSA (obstructive sleep apnea)    cpap setting of 12  . Pain in joint of right hip 01/21/2018  . Palpitations 01/21/2011   48 hour Holter, 2015, scattered PACs and PVCs.   Marland Kitchen PONV (postoperative nausea and vomiting)    severe after every surgery  . Pulmonary hypertension, secondary   . RHINITIS 07/21/2010       . RUQ abdominal pain   . S/P left TKA 07/27/2011  . Sinus node dysfunction ([place]) 11/08/2015  . Sleep apnea   . Spinal stenosis of cervical region 11/07/2014  . Urinary frequency 12/09/2018  . UTI (urinary tract infection) 08/15/2019   per pt report  . VENTRICULAR HYPERTROPHY, LEFT 11/24/2008   Qualifier: Diagnosis of  By: Sidney Ace     Past Surgical History:  Procedure Laterality Date  . ABDOMINAL HYSTERECTOMY     with appendectomy and colon polypectomy  . ANTERIOR CERVICAL DECOMP/DISCECTOMY FUSION N/A 11/07/2014   Procedure: Anterior Cervical diskectomy and fusion Cervical four-five, Cervical five-six, Cervical six-seven;  Surgeon: Kary Kos, MD;  Location: [facility];  Service: Neurosurgery;  Laterality: N/A;  . APPENDECTOMY    . ARTERY BIOPSY Right 02/16/2017   Procedure: BIOPSY TEMPORAL ARTERY;  Surgeon: Katha Cabal, MD;  Location: [facility] ORS;  Service: Vascular;  Laterality: Right;  . BACK SURGERY    . CARDIAC CATHETERIZATION    . CATARACT EXTRACTION W/ INTRAOCULAR LENS  IMPLANT, BILATERAL Bilateral   . CHOLECYSTECTOMY N/A 04/29/2016   Procedure: LAPAROSCOPIC  CHOLECYSTECTOMY WITH INTRAOPERATIVE CHOLANGIOGRAM;  Surgeon: Alphonsa Overall, MD;  Location: [facility];  Service: General;  Laterality: N/A;  . EP IMPLANTABLE DEVICE N/A 11/08/2015   Procedure: Pacemaker Implant;  Surgeon: Deboraha Sprang, MD;  Location: [facility];  Service: Cardiovascular;  Laterality: N/A;  . INSERT / REPLACE / REMOVE PACEMAKER  2017   Dr. Jens Som [facility])  . JOINT REPLACEMENT    . LUMBAR DISC SURGERY  1990's   x 2  . THYROIDECTOMY  2011  . TOTAL KNEE ARTHROPLASTY  07/27/2011   Procedure: TOTAL KNEE ARTHROPLASTY;  Surgeon: Mauri Pole, MD;  Location: [facility];  Service: Orthopedics;  Laterality: Left;  . UPPER ESOPHAGEAL ENDOSCOPIC ULTRASOUND (EUS) N/A 05/14/2016   Procedure: UPPER ESOPHAGEAL ENDOSCOPIC ULTRASOUND (EUS);  Surgeon: Milus Banister, MD;  Location: [facility];  Service: Endoscopy;  Laterality: N/A;  radial only-will be done mod if needed    Current Outpatient Medications  Medication Sig Dispense Refill  . acetaminophen (TYLENOL) 500 MG tablet Take 1,000 mg by mouth every 6 (six) hours as needed for moderate pain (for headaches).     Marland Kitchen albuterol (PROVENTIL) (2.5 MG/3ML) 0.083% nebulizer solution Take 2.5 mg by nebulization every 2 (two) hours as needed for wheezing or shortness of breath.     Marland Kitchen albuterol (VENTOLIN HFA) 108 (90 Base) MCG/ACT inhaler Inhale 2 puffs into the lungs every 6 (six) hours as needed for wheezing or shortness of breath. 3 Inhaler 3  . allopurinol (ZYLOPRIM) 100 MG tablet Take 100 mg by mouth 2 (two) times daily.     Marland Kitchen amLODipine (NORVASC) 5 MG tablet Take 1 tablet (5 mg total) by mouth daily. 90 tablet 3  . apixaban (ELIQUIS) 5 MG TABS tablet Take 1 tablet (5 mg total) by mouth 2 (two) times daily. 180 tablet 2  . atorvastatin (LIPITOR) 40 MG tablet Take 1 tablet (40 mg total) by mouth daily. 90 tablet 3  . budesonide-formoterol (SYMBICORT) 160-4.5 MCG/ACT inhaler Inhale 2 puffs into the lungs 2 (two) times daily for 1 day. 3 Inhaler  3  . Cholecalciferol (VITAMIN D-3 PO) Take 1 capsule by mouth daily with breakfast.    . Dextromethorphan-guaiFENesin (TUSSIN DM MAX SUGAR-FREE PO) Take 5 mLs by mouth every 4 (four) hours as needed (for coughing).    . fluticasone (FLONASE) 50 MCG/ACT nasal spray USE 2 SPRAYS NASALLY DAILY 48 g 1  . gabapentin (NEURONTIN) 800 MG tablet Take 800 mg by mouth See admin instructions. Take 800 mg by mouth three to four times a day    . HYDROcodone-acetaminophen (NORCO/VICODIN) 5-325 MG tablet Take 1-2 tablets by mouth every 6 (six) hours as needed for moderate pain. 30 tablet 0  . insulin aspart (NOVOLOG FLEXPEN) 100 UNIT/ML FlexPen Inject 8-13 Units into the skin See admin instructions. Inject 8-13 units into the skin three times a day with meals, per sliding scale (and an additional 1 unit for every 50 points for a BGL >130)    . insulin glargine (LANTUS) 100 UNIT/ML injection Inject 130 Units into the skin in the morning and at bedtime.     Marland Kitchen levothyroxine (SYNTHROID) 175 MCG tablet Take 175 mcg by mouth daily before breakfast.     . LORazepam (ATIVAN) 2 MG tablet Take 2 mg by mouth at bedtime as needed for anxiety.     Marland Kitchen losartan (COZAAR) 100 MG tablet Take 100 mg by mouth daily.     . Magnesium Oxide 400 (240 Mg) MG TABS Take 1 tablet (400 mg total) by mouth daily. 30 tablet 3  . metoprolol tartrate (LOPRESSOR) 50 MG tablet Take 50 mg by mouth in the morning and at bedtime.    . nitroGLYCERIN (NITROSTAT) 0.4 MG SL tablet Place 1 tablet (0.4 mg total) under the tongue every 5 (five) minutes as needed for chest pain. 25 tablet 4  . PRESCRIPTION MEDICATION CPAP: At bedtime and "as needed for shortness of breath"    . promethazine (PHENADOZ) 25 MG suppository Place 25 mg rectally daily as needed (for headaches).     . silver sulfADIAZINE (SILVADENE) 1 % cream Apply pea-sized amount to wound daily. 50 g 0  .  Tiotropium Bromide Monohydrate (SPIRIVA RESPIMAT) 2.5 MCG/ACT AERS Inhale 2 puffs into the lungs  daily. 12 g 3  . torsemide (DEMADEX) 20 MG tablet Take 1 tablet (20 mg total) by mouth 2 (two) times daily. 60 tablet 3  . TRUE METRIX BLOOD GLUCOSE TEST test strip     . vitamin B-12 (CYANOCOBALAMIN) 1000 MCG tablet Take 1,000 mcg by mouth daily.     No current facility-administered medications for this encounter.    Allergies  Allergen Reactions  . Dulaglutide Nausea Only, Other (See Comments) and Cough    Made the patient sick to her stomach (only) several days after using it    Social History   Socioeconomic History  . Marital status: Married    Spouse name: Jeneen Rinks  . Number of children: 3  . Years of education: 37  . Highest education level: Not on file  Occupational History  . Occupation: retired  Tobacco Use  . Smoking status: Former Smoker    Packs/day: 2.00    Years: 33.00    Pack years: 66.00    Types: Cigarettes    Start date: 1966    Quit date: 05/04/1997    Years since quitting: 22.5  . Smokeless tobacco: Never Used  Vaping Use  . Vaping Use: Never used  Substance and Sexual Activity  . Alcohol use: No  . Drug use: No  . Sexual activity: Not on file  Other Topics Concern  . Not on file  Social History Narrative   Patient lives at home with her husband Jeneen Rinks .   Retired.   Education 12 th grade.   Right handed   Caffeine two cups of coffee.   Social Determinants of Health   Financial Resource Strain:   . Difficulty of Paying Living Expenses:   Food Insecurity:   . Worried About Charity fundraiser in the Last Year:   . Arboriculturist in the Last Year:   Transportation Needs:   . Film/video editor (Medical):   Marland Kitchen Lack of Transportation (Non-Medical):   Physical Activity:   . Days of Exercise per Week:   . Minutes of Exercise per Session:   Stress:   . Feeling of Stress :   Social Connections:   . Frequency of Communication with Friends and Family:   . Frequency of Social Gatherings with Friends and Family:   . Attends Religious Services:    . Active Member of Clubs or Organizations:   . Attends Archivist Meetings:   Marland Kitchen Marital Status:   Intimate Partner Violence:   . Fear of Current or Ex-Partner:   . Emotionally Abused:   Marland Kitchen Physically Abused:   . Sexually Abused:      ROS- All systems are reviewed and negative except as per the HPI above.  Physical Exam: Vitals:   10/30/19 1132  BP: 130/82  Pulse: 67  Weight: 107.3 kg  Height: 5\' 6"  (1.676 m)    GEN- The patient is well appearing obese elderly female, alert and oriented x 3 today.   HEENT-head normocephalic, atraumatic, sclera clear, conjunctiva pink, hearing intact, trachea midline. Lungs- Clear to ausculation bilaterally, normal work of breathing Heart- irregular rate and rhythm, no murmurs, rubs or gallops  GI- soft, NT, ND, + BS Extremities- no clubbing, cyanosis, or edema MS- no significant deformity or atrophy Skin- no rash or lesion Psych- euthymic mood, full affect Neuro- strength and sensation are intact   Wt Readings from Last 3 Encounters:  10/30/19 107.3 kg  10/25/19 108.2 kg  10/17/19 108.4 kg    EKG today demonstrates afib HR 67, QRS 86, QTc 422  Echo 08/13/19 demonstrated  1. Left ventricular ejection fraction, by estimation, is 65 to 70%. The  left ventricle has normal function. The left ventricle has no regional  wall motion abnormalities. There is moderate concentric left ventricular  hypertrophy. Unable to assess LV  diastolic filling due to underlying atrial fibrillation.  2. Right ventricular systolic function is normal. The right ventricular  size is normal. There is normal pulmonary artery systolic pressure.  3. Right atrial size was moderately dilated.  4. The mitral valve is normal in structure. No evidence of mitral valve  regurgitation. No evidence of mitral stenosis.  5. The aortic valve is tricuspid. Aortic valve regurgitation is not  visualized. Mild aortic valve sclerosis is present, with no evidence  of  aortic valve stenosis.  6. The inferior vena cava is dilated in size with >50% respiratory  variability, suggesting right atrial pressure of 8 mmHg.   Epic records are reviewed at length today  Assessment and Plan:  1. Paroxysmal atrial fibrillation Patient appears to be having more frequent episodes of afib. Suspicion that this is contributing to her diastolic dysfunction/fluid retention.  Patient wants to pursue dofetilide, aware of risk vrs benefit Aware of price of dofetilide No benadryl use PharmD has screened drugs, no contraindicated medications. Will monitor QT closely on inhalers. QTc in SR 430 ms, Labs today show creatinine at 1.68, K+ 4.7 and mag 1.5, CrCl calculated at 47 mL/min. Mag to be replaced prior to first dose.   Continue Lopressor 50 mg BID Continue Eliquis 5 mg BID. Patient denies any missed doses in the last 3 weeks.   This patients CHA2DS2-VASc Score and unadjusted Ischemic Stroke Rate (% per year) is equal to 9.7 % stroke rate/year from a score of 6  Above score calculated as 1 point each if present [CHF, HTN, DM, Vascular=MI/PAD/Aortic Plaque, Age if 65-74, or Female] Above score calculated as 2 points each if present [Age > 75, or Stroke/TIA/TE]  2. Obesity Body mass index is 38.19 kg/m. Lifestyle modification was discussed and encouraged including regular physical activity and weight reduction.  3. Obstructive sleep apnea Patient compliant with CPAP therapy.  4. CAD Cardiac CTA 12/31/17 with diffuse plaque with moderate disease in RCA and LAD. FFR showed stenosis in distal OM2.  No anginal symptoms.  5. Chronic diastolic CHF No signs or symptoms of fluid overload today.  6. HTN Stable, no changes today.  7. Sinus node dysfunction S/p PPM, followed by Dr Caryl Comes and the device clinic.    To be admitted later today once a bed becomes available.    [facility] [address] [place], [place]  [phone] 10/30/2019 11:59 AM

## 2019-10-30 NOTE — Telephone Encounter (Signed)
Called and spoke with pt letting her know that VS said she could stop the [place] and we could put her back on Spiriva and Symbicort. Pt verbalized understanding. Rx for both Spiriva and Symbicort have been sent to preferred pharmacy. Nothing further needed.

## 2019-10-31 ENCOUNTER — Encounter (HOSPITAL_COMMUNITY): Payer: Self-pay | Admitting: Anesthesiology

## 2019-10-31 LAB — BASIC METABOLIC PANEL
Anion gap: 12 (ref 5–15)
BUN: 50 mg/dL — ABNORMAL HIGH (ref 8–23)
CO2: 27 mmol/L (ref 22–32)
Calcium: 9.6 mg/dL (ref 8.9–10.3)
Chloride: 100 mmol/L (ref 98–111)
Creatinine, Ser: 1.78 mg/dL — ABNORMAL HIGH (ref 0.44–1.00)
GFR calc Af Amer: 31 mL/min — ABNORMAL LOW (ref >60)
GFR calc non Af Amer: 27 mL/min — ABNORMAL LOW (ref >60)
Glucose, Bld: 132 mg/dL — ABNORMAL HIGH (ref 70–99)
Potassium: 4.1 mmol/L (ref 3.5–5.1)
Sodium: 139 mmol/L (ref 135–145)

## 2019-10-31 LAB — GLUCOSE, CAPILLARY
Glucose-Capillary: 81 mg/dL (ref 70–99)
Glucose-Capillary: 213 mg/dL — ABNORMAL HIGH (ref 70–99)
Glucose-Capillary: 191 mg/dL — ABNORMAL HIGH (ref 70–99)
Glucose-Capillary: 299 mg/dL — ABNORMAL HIGH (ref 70–99)

## 2019-10-31 LAB — MAGNESIUM: Magnesium: 2.2 mg/dL (ref 1.7–2.4)

## 2019-10-31 NOTE — Progress Notes (Signed)
Inpatient Diabetes Program Recommendations  AACE/ADA: New Consensus Statement on Inpatient Glycemic Control (2015)  Target Ranges:  Prepandial:   less than 140 mg/dL      Peak postprandial:   less than 180 mg/dL (1-2 hours)      Critically ill patients:  140 - 180 mg/dL   Lab Results  Component Value Date   GLUCAP 213 (H) 10/31/2019   HGBA1C 8.9 (H) 10/30/2019    Review of Glycemic Control Results for Kayla [name], Kayla [name] (MRN [id number]) as of 10/31/2019 15:48  Ref. Range 10/30/2019 18:18 10/30/2019 21:02 10/31/2019 07:46 10/31/2019 12:18  Glucose-Capillary Latest Ref Range: 70 - 99 mg/dL 185 (H) 313 (H) 81 213 (H)   Diabetes history: DM 2 Outpatient Diabetes medications: Lantus 130 units bid, Novolog 8-13 units tid  Current orders for Inpatient glycemic control:  Lantus 130 units bid Novolog 0-20 units tid  Inpatient Diabetes Program Recommendations:    Glucose increases after meal intake. Pt takes Novolog meal coverage at home.  - Consider adding Novolog 5 units tid meal coverage if eating >50% of meals.  Thanks,  Tama Headings RN, MSN, BC-ADM Inpatient Diabetes Coordinator Team Pager [phone] (8a-5p)

## 2019-10-31 NOTE — Care Management (Signed)
10-31-19 1529 Patient presented for Tikosyn load! Benefits check submitted for Tikosyn- Case Manager will follow for cost and will make patient aware of cost once completed. Case Manager will follow up for pharmacy of choice and Case Manager will call to make sure medication is available. Graves-Bigelow, Ocie Cornfield, RN, BSN Case Manager

## 2019-10-31 NOTE — Progress Notes (Signed)
Pharmacy: Dofetilide (Tikosyn) - Follow Up Assessment and Electrolyte Replacement  Pharmacy consulted to assist in monitoring and replacing electrolytes in this 79 y.o. female admitted on 10/30/2019 undergoing dofetilide initiation.   Labs:    Component Value Date/Time   K 4.1 10/31/2019 0415   MG 2.2 10/31/2019 0415     Plan: Potassium: K >/= 4: No additional supplementation needed  Magnesium: Mg > 2: No additional supplementation needed   Thank you for allowing pharmacy to participate in this patient's care   Hildred Laser, PharmD Clinical Pharmacist **Pharmacist phone directory can now be found on [email or web address] ([email or web address]).  Listed under [place].

## 2019-10-31 NOTE — Progress Notes (Addendum)
Progress Note  Patient Name: Kayla [name] Date of Encounter: 10/31/2019  [facility] Cardiologist: Dr. Marlou Porch EP: Dr. Caryl Comes  Subjective   Feels well, thinks she can tell she is back in rhythm  Inpatient Medications    Scheduled Meds: . allopurinol  100 mg Oral BID  . amLODipine  5 mg Oral Daily  . apixaban  5 mg Oral BID  . atorvastatin  40 mg Oral Daily  . dofetilide  250 mcg Oral BID  . gabapentin  800 mg Oral TID  . insulin aspart  0-20 Units Subcutaneous TID WC  . insulin glargine  130 Units Subcutaneous BID  . levothyroxine  175 mcg Oral QAC breakfast  . losartan  100 mg Oral Daily  . magnesium oxide  400 mg Oral Daily  . metoprolol tartrate  50 mg Oral BID  . mometasone-formoterol  2 puff Inhalation BID  . sodium chloride flush  3 mL Intravenous Q12H  . torsemide  20 mg Oral BID  . umeclidinium bromide  1 puff Inhalation Daily   Continuous Infusions: . sodium chloride     PRN Meds: sodium chloride, acetaminophen, albuterol, fluticasone, gabapentin, HYDROcodone-acetaminophen, LORazepam, sodium chloride flush   Vital Signs    Vitals:   10/30/19 1732 10/30/19 2025 10/31/19 0332 10/31/19 0337  BP: (!) 126/48 (!) 154/63  (!) 148/50  Pulse: 63 64 62 64  Resp: 16 18  20   Temp: 98.1 F (36.7 C) 97.6 F (36.4 C)  97.8 F (36.6 C)  TempSrc: Oral Oral  Oral  SpO2: 94% 97% 97% 94%  Weight:    106.5 kg  Height:        Intake/Output Summary (Last 24 hours) at 10/31/2019 0723 Last data filed at 10/30/2019 1606 Gross per 24 hour  Intake 240 ml  Output --  Net 240 ml   Last 3 Weights 10/31/2019 10/30/2019 10/30/2019  Weight (lbs) 234 lb 12.8 oz 235 lb 236 lb 9.6 oz  Weight (kg) 106.505 kg 106.595 kg 107.321 kg      Telemetry    AFib with occ V paciong >> AP/VS - Personally Reviewed  ECG    AP/VS QT is 457ms, QTc 462ms - Personally Reviewed  Physical Exam   GEN: No acute distress.   Neck: No JVD Cardiac: RRR, no murmurs, rubs, or gallops.   Respiratory: CTA b/l. GI: Soft, nontender, non-distended  MS: trace edema LLE (chronically > then right per the patient); No deformity. Neuro:  Nonfocal  Psych: Normal affect   Labs    High Sensitivity Troponin:   Recent Labs  Lab 10/02/19 2222 10/03/19 0035  TROPONINIHS 11 11      Chemistry Recent Labs  Lab 10/30/19 1124 10/31/19 0415  NA 138 139  K 4.7 4.1  CL 101 100  CO2 19* 27  GLUCOSE 295* 132*  BUN 49* 50*  CREATININE 1.68* 1.78*  CALCIUM 9.2 9.6  GFRNONAA 29* 27*  GFRAA 33* 31*  ANIONGAP 18* 12     HematologyNo results for input(s): WBC, RBC, HGB, HCT, MCV, MCH, MCHC, RDW, PLT in the last 168 hours.  BNPNo results for input(s): BNP, PROBNP in the last 168 hours.   DDimer No results for input(s): DDIMER in the last 168 hours.   Radiology    No results found.  Cardiac Studies   08/13/2019: TTE IMPRESSIONS 1. Left ventricular ejection fraction, by estimation, is 65 to 70%. The  left ventricle has normal function. The left ventricle has no regional  wall motion abnormalities. There is moderate concentric left ventricular  hypertrophy. Unable to assess LV  diastolic filling due to underlying atrial fibrillation.  2. Right ventricular systolic function is normal. The right ventricular  size is normal. There is normal pulmonary artery systolic pressure.  3. Right atrial size was moderately dilated.  4. The mitral valve is normal in structure. No evidence of mitral valve  regurgitation. No evidence of mitral stenosis.  5. The aortic valve is tricuspid. Aortic valve regurgitation is not  visualized. Mild aortic valve sclerosis is present, with no evidence of  aortic valve stenosis.  6. The inferior vena cava is dilated in size with >50% respiratory  variability, suggesting right atrial pressure of 8 mmHg.   Patient Profile     79 y.o. female PMHx of CAD, COPD (Dr. Halford Chessman), smoker, chronic DOE, GERD, HTN, hypothyroidism, OSA w/CPAP, symptomatic  bradycardia w/PPM, chronic CHF (diastolic) and AFib admitted for tikosyn initiation with increasing AFib burden and HF symptoms  Assessment & Plan    1. Paroxysmal AFib    CHa2DS2 Vasc is 6, on Eliquis, appropriately doses    Tikosyn initiation is in progress    K+ 4.1    Mag 2.2    Creat 1.78, calc VCrCl is 44, Tikosyn appropriately dosed at 210mcg    QT stable  She has converted with drug  2. CAD     On statin, BB, no ASA with Eliquis     No anginal symptoms  3. COPD 4. OSA w/PAP  5. HTN     No changes  6. Chronic CHF (diastolic)     Some degree of chronic DOE, mulifactorial     Continue home torsemide  7. DM     Continue home regime   For questions or updates, please contact [place] Please consult [email or web address] for contact info under        Signed, Baldwin Jamaica, PA-C  10/31/2019, 7:23 AM     I have seen, examined the patient, and reviewed the above assessment and plan.  Changes to above are made where necessary.  On exam, RRR.  Qt is stable.  Continue tikosyn loading.  Co Sign: Thompson Grayer, MD 10/31/2019 9:13 PM

## 2019-11-01 ENCOUNTER — Encounter (HOSPITAL_COMMUNITY)
Admission: AD | Disposition: A | Discharge status: Home / Self Care | Payer: Self-pay | Source: Ambulatory Visit | Attending: Internal Medicine

## 2019-11-01 LAB — GLUCOSE, CAPILLARY
Glucose-Capillary: 177 mg/dL — ABNORMAL HIGH (ref 70–99)
Glucose-Capillary: 222 mg/dL — ABNORMAL HIGH (ref 70–99)
Glucose-Capillary: 199 mg/dL — ABNORMAL HIGH (ref 70–99)
Glucose-Capillary: 191 mg/dL — ABNORMAL HIGH (ref 70–99)

## 2019-11-01 LAB — MAGNESIUM: Magnesium: 1.8 mg/dL (ref 1.7–2.4)

## 2019-11-01 LAB — BASIC METABOLIC PANEL
Anion gap: 11 (ref 5–15)
BUN: 62 mg/dL — ABNORMAL HIGH (ref 8–23)
CO2: 26 mmol/L (ref 22–32)
Calcium: 9.5 mg/dL (ref 8.9–10.3)
Chloride: 97 mmol/L — ABNORMAL LOW (ref 98–111)
Creatinine, Ser: 1.82 mg/dL — ABNORMAL HIGH (ref 0.44–1.00)
GFR calc Af Amer: 30 mL/min — ABNORMAL LOW (ref >60)
GFR calc non Af Amer: 26 mL/min — ABNORMAL LOW (ref >60)
Glucose, Bld: 266 mg/dL — ABNORMAL HIGH (ref 70–99)
Potassium: 4.5 mmol/L (ref 3.5–5.1)
Sodium: 134 mmol/L — ABNORMAL LOW (ref 135–145)

## 2019-11-01 SURGERY — CARDIOVERSION
Anesthesia: General

## 2019-11-01 MED ORDER — MAGNESIUM SULFATE 2 GM/50ML IV SOLN
2.0000 g | Freq: Once | INTRAVENOUS | Status: AC
  Administered 2019-11-01: 2 g via INTRAVENOUS
  Filled 2019-11-01: qty 50

## 2019-11-01 NOTE — Progress Notes (Addendum)
Progress Note  Patient Name: Kayla [name] Date of Encounter: 11/01/2019  [facility] Cardiologist: Dr. Marlou Porch EP: Dr. Caryl Comes  Subjective   Has a headache, otherwise feels well.  Inpatient Medications    Scheduled Meds: . allopurinol  100 mg Oral BID  . amLODipine  5 mg Oral Daily  . apixaban  5 mg Oral BID  . atorvastatin  40 mg Oral Daily  . dofetilide  250 mcg Oral BID  . gabapentin  800 mg Oral TID  . insulin aspart  0-20 Units Subcutaneous TID WC  . insulin glargine  130 Units Subcutaneous BID  . levothyroxine  175 mcg Oral QAC breakfast  . losartan  100 mg Oral Daily  . magnesium oxide  400 mg Oral Daily  . metoprolol tartrate  50 mg Oral BID  . mometasone-formoterol  2 puff Inhalation BID  . sodium chloride flush  3 mL Intravenous Q12H  . torsemide  20 mg Oral BID  . umeclidinium bromide  1 puff Inhalation Daily   Continuous Infusions: . sodium chloride    . magnesium sulfate bolus IVPB 2 g (11/01/19 0807)   PRN Meds: sodium chloride, acetaminophen, albuterol, fluticasone, gabapentin, HYDROcodone-acetaminophen, LORazepam, sodium chloride flush   Vital Signs    Vitals:   11/01/19 0446 11/01/19 0719 11/01/19 0720 11/01/19 0810  BP: (!) 147/45     Pulse:      Resp: 18     Temp: 98 F (36.7 C)     TempSrc: Oral     SpO2:  97% 94% 96%  Weight: 107.4 kg     Height:        Intake/Output Summary (Last 24 hours) at 11/01/2019 0813 Last data filed at 10/31/2019 1750 Gross per 24 hour  Intake 480 ml  Output --  Net 480 ml   Last 3 Weights 11/01/2019 10/31/2019 10/30/2019  Weight (lbs) 236 lb 11.2 oz 234 lb 12.8 oz 235 lb  Weight (kg) 107.366 kg 106.505 kg 106.595 kg      Telemetry    AP/VS  - Personally Reviewed  ECG    AP/VS QT is 484ms, QTc 464ms - Personally Reviewed  Physical Exam   GEN: No acute distress.   Neck: No JVD Cardiac: RRR, no murmurs, rubs, or gallops.  Respiratory: CTA b/l. GI: Soft, nontender, non-distended  MS: trace  edema LLE (chronically > then right per the patient); No deformity. Neuro:  Nonfocal  Psych: Normal affect   Labs    High Sensitivity Troponin:   Recent Labs  Lab 10/02/19 2222 10/03/19 0035  TROPONINIHS 11 11      Chemistry Recent Labs  Lab 10/30/19 1124 10/31/19 0415 11/01/19 0405  NA 138 139 134*  K 4.7 4.1 4.5  CL 101 100 97*  CO2 19* 27 26  GLUCOSE 295* 132* 266*  BUN 49* 50* 62*  CREATININE 1.68* 1.78* 1.82*  CALCIUM 9.2 9.6 9.5  GFRNONAA 29* 27* 26*  GFRAA 33* 31* 30*  ANIONGAP 18* 12 11     HematologyNo results for input(s): WBC, RBC, HGB, HCT, MCV, MCH, MCHC, RDW, PLT in the last 168 hours.  BNPNo results for input(s): BNP, PROBNP in the last 168 hours.   DDimer No results for input(s): DDIMER in the last 168 hours.   Radiology    No results found.  Cardiac Studies   08/13/2019: TTE IMPRESSIONS 1. Left ventricular ejection fraction, by estimation, is 65 to 70%. The  left ventricle has normal function. The left  ventricle has no regional  wall motion abnormalities. There is moderate concentric left ventricular  hypertrophy. Unable to assess LV  diastolic filling due to underlying atrial fibrillation.  2. Right ventricular systolic function is normal. The right ventricular  size is normal. There is normal pulmonary artery systolic pressure.  3. Right atrial size was moderately dilated.  4. The mitral valve is normal in structure. No evidence of mitral valve  regurgitation. No evidence of mitral stenosis.  5. The aortic valve is tricuspid. Aortic valve regurgitation is not  visualized. Mild aortic valve sclerosis is present, with no evidence of  aortic valve stenosis.  6. The inferior vena cava is dilated in size with >50% respiratory  variability, suggesting right atrial pressure of 8 mmHg.   Patient Profile     79 y.o. female PMHx of CAD, COPD (Dr. Halford Chessman), smoker, chronic DOE, GERD, HTN, hypothyroidism, OSA w/CPAP, symptomatic bradycardia  w/PPM, chronic CHF (diastolic) and AFib admitted for tikosyn initiation with increasing AFib burden and HF symptoms  Assessment & Plan    1. Paroxysmal AFib    CHa2DS2 Vasc is 6, on Eliquis, appropriately doses    Tikosyn initiation is in progress    K+ 4.5    Mag 1.8, replacement ordered    Creat 1.82, calc CrCl is 43, Tikosyn appropriately dosed at 289mcg    QT stable  She converted with drug (1st dose) Anticipate discharge tomorrow  2. CAD     On statin, BB, no ASA with Eliquis     No anginal symptoms  3. COPD 4. OSA w/PAP  5. HTN     No changes  6. Chronic CHF (diastolic)     Some degree of chronic DOE, mulifactorial     Continue home torsemide  7. DM     Continue home regime, she reports home numbers are better   For questions or updates, please contact [place] Please consult [email or web address] for contact info under        Signed, Baldwin Jamaica, PA-C  11/01/2019, 8:13 AM    Continue dofetilide loading Tolerating   Hope HA is not dofetilide related-- seen commonly, but if so hopefully will abate  Anticipate home tomorrow

## 2019-11-01 NOTE — Care Management (Signed)
1352 11-01-19 Case Manager spoke with patient regarding Dofetilide cost. Patient wants the Rx for 30 day supply [facility] and the 90 day Rx e-scribed to [facility] mail order. No further needs from Case Manager at this time. Bethena Roys, RN,BSN Case Manager

## 2019-11-01 NOTE — Progress Notes (Signed)
Pharmacy: Dofetilide (Tikosyn) - Follow Up Assessment and Electrolyte Replacement  Pharmacy consulted to assist in monitoring and replacing electrolytes in this 79 y.o. female admitted on 10/30/2019 undergoing dofetilide initiation.   Labs:    Component Value Date/Time   K 4.5 11/01/2019 0405   MG 1.8 11/01/2019 0405     Plan: Potassium: K >/= 4: No additional supplementation needed  Magnesium: Mg 1.8-2: Give Mg 2 gm IV x1    Thank you for allowing pharmacy to participate in this patient's care   Erin Hearing PharmD., BCPS Clinical Pharmacist 11/01/2019 7:34 AM

## 2019-11-01 NOTE — TOC Benefit Eligibility Note (Signed)
Transition of Care [facility]) Benefit Eligibility Note    Patient Details  Name: Kayla [name] MRN: [id number] Date of Birth: [birth year]   Medication/Dose: DOFETILIDE  125. MCG BID  CO-PAY- $ 4.32   DOFETILIDE  250 MCG BID  CO-PAY- $4.32   DOFRTILIDE 500 MCG BID  CO-PAY- $ 4.32  Covered?: Yes  Tier:  (TIER- 4 DRUG)  Prescription [facility]age [facility]  Spoke with Person/Company/Phone Number:: DON  @ [employer] # [phone]  Co-Pay: $ 4.32  Prior Approval: No  Deductible: Met  Additional Notes: TIKOSYN  125 MCG BID   250 MCG BID   500 MCG  : NOT [place] # [phone]    Memory Argue Phone Number: 11/01/2019, 12:07 PM

## 2019-11-02 ENCOUNTER — Telehealth: Payer: Self-pay | Admitting: Nurse Practitioner

## 2019-11-02 ENCOUNTER — Other Ambulatory Visit: Payer: Self-pay | Admitting: Physician Assistant

## 2019-11-02 LAB — BASIC METABOLIC PANEL
Anion gap: 13 (ref 5–15)
BUN: 66 mg/dL — ABNORMAL HIGH (ref 8–23)
CO2: 26 mmol/L (ref 22–32)
Calcium: 9.2 mg/dL (ref 8.9–10.3)
Chloride: 96 mmol/L — ABNORMAL LOW (ref 98–111)
Creatinine, Ser: 1.65 mg/dL — ABNORMAL HIGH (ref 0.44–1.00)
GFR calc Af Amer: 34 mL/min — ABNORMAL LOW (ref >60)
GFR calc non Af Amer: 29 mL/min — ABNORMAL LOW (ref >60)
Glucose, Bld: 140 mg/dL — ABNORMAL HIGH (ref 70–99)
Potassium: 4.3 mmol/L (ref 3.5–5.1)
Sodium: 135 mmol/L (ref 135–145)

## 2019-11-02 LAB — GLUCOSE, CAPILLARY
Glucose-Capillary: 123 mg/dL — ABNORMAL HIGH (ref 70–99)
Glucose-Capillary: 243 mg/dL — ABNORMAL HIGH (ref 70–99)

## 2019-11-02 LAB — MAGNESIUM: Magnesium: 2.3 mg/dL (ref 1.7–2.4)

## 2019-11-02 MED ORDER — DOFETILIDE 250 MCG PO CAPS: 250.0000 ug | ORAL_CAPSULE | Freq: Two times a day (BID) | ORAL | 2 refills | Status: DC

## 2019-11-02 NOTE — Telephone Encounter (Signed)
Patient was prescribed today dofetilide (TIKOSYN) 250 MCG capsule it was sent to [facility] mail order pharmacy, [phone] [facility] [address]

## 2019-11-02 NOTE — Progress Notes (Signed)
Pharmacy: Dofetilide (Tikosyn) - Follow Up Assessment and Electrolyte Replacement  Pharmacy consulted to assist in monitoring and replacing electrolytes in this 79 y.o. female admitted on 10/30/2019 undergoing dofetilide initiation.   Labs:    Component Value Date/Time   K 4.3 11/02/2019 0419   MG 2.3 11/02/2019 0419     Plan: Potassium: K >/= 4: No additional supplementation needed  Magnesium: Mg > 2: No additional supplementation needed   Thank you for allowing pharmacy to participate in this patient's care   Erin Hearing PharmD., BCPS Clinical Pharmacist 11/02/2019 11:33 AM

## 2019-11-02 NOTE — Discharge Summary (Signed)
ELECTROPHYSIOLOGY PROCEDURE DISCHARGE SUMMARY    Patient ID: Kayla [name],  MRN: [id number], DOB/AGE: 05/31/1940 79 y.o.  Admit date: 10/30/2019 Discharge date: 11/02/2019  Primary Care Physician: [name], MD  Primary Cardiologist: Dr. Marlou Porch Electrophysiologist: Dr. Caryl Comes  Primary Discharge Diagnosis:  1.  paroxysmal atrial fibrillation status post Tikosyn loading this admission      CHA2DS2Vasc is 6, on Eliquis, appropriately dosed  Secondary Discharge Diagnosis:  1. CAD 2. COPD 4. OSA     W/CPAP 5. HTN 6. DM 7. Chronic CHF (diastolic) 8. PPM 9. CKD (III)       Allergies  Allergen Reactions   Dulaglutide Nausea Only, Other (See Comments) and Cough    Made the patient sick to her stomach (only) several days after using it     Procedures This Admission:  1.  Tikosyn loading   Brief HPI: Kayla [name] is a 79 y.o. female with a past medical history as noted above. She is followed by Dr. Caryl Comes.  Risks, benefits, and alternatives to Tikosyn were reviewed with the patient who wished to proceed.    Hospital Course:  The patient was admitted and Tikosyn was initiated.  She required some initial electrolyte replacement though not consistently through her stay.  Renal function remained stable.  Slightly above her baseline Creat.  Maintaining SR should help with her diastilic HF, and hopefully can back off her torsemide at her follow up visit.  Her QTc remained stable.  She converted with drug and did not require DCCV, maintaining SR/A paced rhythm to time of discharge.  On the day of discharge, she feels well, the patient was examined by Dr Caryl Comes who considered them stable for discharge to home.  Follow-up has been arranged with the Afib clinic in 1 week and with myself in 4 weeks.   Tikosyn education was completed  I have confirmed drug availability at [facility] [place] and sent a 30 day suply there, and have sent 90 day Rx to  [facility] as well  Physical Exam: Vitals:   11/02/19 0649 11/02/19 0743 11/02/19 0848 11/02/19 0849  BP: (!) 131/49 (!) 109/47    Pulse: 64 62    Resp: 18 20    Temp: 97.7 F (36.5 C) 98.2 F (36.8 C)    TempSrc: Oral Oral    SpO2: 96% 97% 96% 96%  Weight: 106.6 kg     Height:        GEN- The patient is well appearing, alert and oriented x 3 today.   HEENT: normocephalic, atraumatic; sclera clear, conjunctiva pink; hearing intact; oropharynx clear; neck supple, no JVP Lymph- no cervical lymphadenopathy Lungs- CTA b/l, normal work of breathing.  No wheezes, rales, rhonchi Heart- RRR, no murmurs, rubs or gallops, PMI not laterally displaced GI- soft, non-tender, non-distended Extremities- no clubbing, cyanosis, or edema MS- no significant deformity or atrophy Skin- warm and dry, no rash or lesion Psych- euthymic mood, full affect Neuro- strength and sensation are intact   Labs:   Lab Results  Component Value Date   WBC 11.4 (H) 10/17/2019   HGB 9.6 (L) 10/17/2019   HCT 29.2 (L) 10/17/2019   MCV 86 10/17/2019   PLT 504 (H) 10/17/2019    Recent Labs  Lab 11/02/19 0419  NA 135  K 4.3  CL 96*  CO2 26  BUN 66*  CREATININE 1.65*  CALCIUM 9.2  GLUCOSE 140*     Discharge Medications:  Allergies as of 11/02/2019      Reactions   Dulaglutide Nausea Only, Other (See Comments), Cough   Made the patient sick to her stomach (only) several days after using it      Medication List    TAKE these medications   acetaminophen 500 MG tablet Commonly known as: TYLENOL Take 1,000 mg by mouth every 6 (six) hours as needed for moderate pain (for headaches).   albuterol (2.5 MG/3ML) 0.083% nebulizer solution Commonly known as: PROVENTIL Take 2.5 mg by nebulization every 2 (two) hours as needed for wheezing or shortness of breath.   albuterol 108 (90 Base) MCG/ACT inhaler Commonly known as: Ventolin HFA Inhale 2 puffs into the lungs every 6 (six) hours as needed  for wheezing or shortness of breath.   allopurinol 100 MG tablet Commonly known as: ZYLOPRIM Take 100 mg by mouth 2 (two) times daily.   amLODipine 5 MG tablet Commonly known as: NORVASC Take 1 tablet (5 mg total) by mouth daily.   apixaban 5 MG Tabs tablet Commonly known as: Eliquis Take 1 tablet (5 mg total) by mouth 2 (two) times daily.   atorvastatin 40 MG tablet Commonly known as: LIPITOR Take 1 tablet (40 mg total) by mouth daily.   budesonide-formoterol 160-4.5 MCG/ACT inhaler Commonly known as: Symbicort Inhale 2 puffs into the lungs 2 (two) times daily for 1 day.   dofetilide 250 MCG capsule Commonly known as: TIKOSYN Take 1 capsule (250 mcg total) by mouth 2 (two) times daily.   fluticasone 50 MCG/ACT nasal spray Commonly known as: FLONASE USE 2 SPRAYS NASALLY DAILY   gabapentin 800 MG tablet Commonly known as: NEURONTIN Take 800 mg by mouth See admin instructions. Take 800 mg by mouth three to four times a day   HYDROcodone-acetaminophen 5-325 MG tablet Commonly known as: NORCO/VICODIN Take 1-2 tablets by mouth every 6 (six) hours as needed for moderate pain.   insulin glargine 100 UNIT/ML injection Commonly known as: LANTUS Inject 130 Units into the skin in the morning and at bedtime.   levothyroxine 175 MCG tablet Commonly known as: SYNTHROID Take 175 mcg by mouth daily before breakfast.   LORazepam 2 MG tablet Commonly known as: ATIVAN Take 2 mg by mouth at bedtime as needed for anxiety.   losartan 100 MG tablet Commonly known as: COZAAR Take 100 mg by mouth daily.   Magnesium Oxide 400 (240 Mg) MG Tabs Take 1 tablet (400 mg total) by mouth daily.   metoprolol tartrate 50 MG tablet Commonly known as: LOPRESSOR Take 50 mg by mouth in the morning and at bedtime.   nitroGLYCERIN 0.4 MG SL tablet Commonly known as: NITROSTAT Place 1 tablet (0.4 mg total) under the tongue every 5 (five) minutes as needed for chest pain.   NovoLOG FlexPen 100  UNIT/ML FlexPen Generic drug: insulin aspart Inject 8-13 Units into the skin See admin instructions. Inject 8-13 units into the skin three times a day with meals, per sliding scale (and an additional 1 unit for every 50 points for a BGL >130)   Phenadoz 25 MG suppository Generic drug: promethazine Place 25 mg rectally daily as needed (for headaches).   PRESCRIPTION MEDICATION CPAP: At bedtime and "as needed for shortness of breath"   silver sulfADIAZINE 1 % cream Commonly known as: Silvadene Apply pea-sized amount to wound daily. What changed:   how much to take  how to take this  when to take this  additional instructions   Spiriva Respimat 2.5 MCG/ACT  Aers Generic drug: Tiotropium Bromide Monohydrate Inhale 2 puffs into the lungs daily.   torsemide 20 MG tablet Commonly known as: DEMADEX Take 1 tablet (20 mg total) by mouth 2 (two) times daily.   True Metrix Blood Glucose Test test strip Generic drug: glucose blood   TUSSIN DM MAX SUGAR-FREE PO Take 5 mLs by mouth every 4 (four) hours as needed (for coughing).   vitamin B-12 1000 MCG tablet Commonly known as: CYANOCOBALAMIN Take 1,000 mcg by mouth daily.   VITAMIN D-3 PO Take 1 capsule by mouth daily with breakfast.       Disposition:  Home Discharge Instructions    Diet - low sodium heart healthy   Complete by: As directed    Increase activity slowly   Complete by: As directed       Follow-up Information    [facility] Follow up.   Specialty: Cardiology Why: 11/10/2019 @ 10:30AM, with C. Marlene Lard, [place]  Contact information: [address] [id number] mc [place] [phone]       Baldwin Jamaica, PA-C Follow up.   Specialty: Cardiology Why: 11/24/2019 @ 11:00AM Contact information: [address] [place] [address] [phone]               Duration of Discharge Encounter: Greater than 30 minutes including physician  time.  Venetia Night, PA-C 11/02/2019 12:00 PM

## 2019-11-02 NOTE — Telephone Encounter (Signed)
[facility] for [facility] this afternoon.

## 2019-11-02 NOTE — Telephone Encounter (Signed)
Caregiver for this patient is at the pharmacy to pick up this medication but the rx has not been sent over yet. [facility] [place] [place]

## 2019-11-02 NOTE — Progress Notes (Signed)
  Subjective:  Patient ID: Kayla [name], female    DOB: 10/24/1940,  MRN: [id number]  Chief Complaint  Patient presents with  . Wound Check    Left 1st digit wound check. Pt states improvement and that she has finished her antibiotics. Denies fever/nausea/vomiting/chills.     79 y.o. female presents for wound care. Hx confirmed with patient.  Objective:  Physical Exam: Wound Location: left hallux  Wound Measurement: 1x0.5 Wound Base: Granular/Healthy Peri-wound: Calloused Exudate: None: wound tissue dry wound without warmth, erythema, signs of acute infection  Assessment:   1. Diabetic ulcer of left great toe [facility])    Plan:  Patient was evaluated and treated and all questions answered.  Ulcer left hallux -Dressing applied consisting of silvadene DSD. -Wound cleansed and debrided -No signs of continued infection  Procedure: Selective Debridement of Wound Rationale: Removal of devitalized tissue from the wound to promote healing.  Pre-Debridement Wound Measurements: 1 cm x 0.4 cm x 0.1 cm  Post-Debridement Wound Measurements: same as pre-debridement. Type of Debridement: sharp selective Tissue Removed: Devitalized soft-tissue Dressing: Dry, sterile, compression dressing. Disposition: Patient tolerated procedure well. Patient to return in 1 week for follow-up.

## 2019-11-02 NOTE — Plan of Care (Signed)

## 2019-11-02 NOTE — Plan of Care (Signed)
Pt d/c to home with self care, education complete.

## 2019-11-03 ENCOUNTER — Other Ambulatory Visit: Payer: Self-pay

## 2019-11-03 ENCOUNTER — Ambulatory Visit: Payer: Medicare HMO | Admitting: Podiatry

## 2019-11-03 ENCOUNTER — Other Ambulatory Visit: Payer: Self-pay | Admitting: Podiatry

## 2019-11-03 ENCOUNTER — Ambulatory Visit (INDEPENDENT_AMBULATORY_CARE_PROVIDER_SITE_OTHER): Payer: Medicare HMO

## 2019-11-03 DIAGNOSIS — E0843 Diabetes mellitus due to underlying condition with diabetic autonomic (poly)neuropathy: Secondary | ICD-10-CM | POA: Diagnosis not present

## 2019-11-03 DIAGNOSIS — M2012 Hallux valgus (acquired), left foot: Secondary | ICD-10-CM

## 2019-11-03 DIAGNOSIS — L97522 Non-pressure chronic ulcer of other part of left foot with fat layer exposed: Secondary | ICD-10-CM

## 2019-11-03 DIAGNOSIS — M25572 Pain in left ankle and joints of left foot: Secondary | ICD-10-CM

## 2019-11-03 DIAGNOSIS — M86672 Other chronic osteomyelitis, left ankle and foot: Secondary | ICD-10-CM

## 2019-11-03 NOTE — Progress Notes (Signed)
  Subjective:  Patient ID: Kayla [name], female    DOB: 09-19-1940,  MRN: [id number]  Chief Complaint  Patient presents with  . Wound Check    "No pain from the wound. The ankle has pain thats pulsating into my toe. No fever/chills/nausea/vomiting/foul odor/pus"   79 y.o. female presents for wound care. Hx confirmed with patient.  Objective:  Physical Exam: Wound Location: Left hallux Wound Measurement: 1.5x2 Wound Base: Mixed Granular/Fibrotic Peri-wound: Calloused Exudate: Scant/small amount Serosanguinous exudate erythema noted along wound margins extending 1 cm dorsally   No images are attached to the encounter. Assessment:   1. Hallux valgus, left   2. Skin ulcer of left great toe with fat layer exposed ([place])   3. Diabetes mellitus due to underlying condition with diabetic autonomic neuropathy, unspecified whether long term insulin use ([place])   4. Sinus tarsitis of left foot   5. Chronic osteomyelitis involving left ankle and foot ([place])    Plan:  Patient was evaluated and treated and all questions answered.  Ulcer left hallux -Dressing applied consisting of iodosorb -Offload ulcer with CAM boot or shoe whichever she can tolerate -Wound cleansed and debrided -Ultimately may need surgical correction however we will have to evaluate if she is a good sx candidate.  Hallux Valgus -Discussed possible Keller bunionectomy for cure of ulceration given chronicity of wound and recalcitrance to other therapies.  We did discuss that there are risks involved up to including infection that could lead to amputation of digit.  We also did discuss that without cure of the ulceration she is at risk of deeper infection which could also cause amputation of digit.  She is inclined to proceed with surgery however I recommend that proceeding with MRI to rule out osteomyelitis given she has chronic redness of the digit although it does not appear to be warm.  Repeat x-rays today did not show  obvious signs of osteomyelitis.  Sinus Tarsitis -Injection delivered as below  Procedure: Injection Intermediate Joint Consent: Verbal consent obtained. Location: Left sinus tarsi. Skin Prep: alcohol. Injectate: 1 cc 0.5% marcaine plain, 1 cc dexamethasone phosphate, 0.5 cc kenalog 10. Disposition: Patient tolerated procedure well. Injection site dressed with a band-aid.  Return in about 2 weeks (around 11/17/2019) for Wound Care.

## 2019-11-07 ENCOUNTER — Telehealth: Payer: Self-pay | Admitting: *Deleted

## 2019-11-07 DIAGNOSIS — Z01812 Encounter for preprocedural laboratory examination: Secondary | ICD-10-CM

## 2019-11-07 DIAGNOSIS — M868X7 Other osteomyelitis, ankle and foot: Secondary | ICD-10-CM

## 2019-11-07 DIAGNOSIS — L97522 Non-pressure chronic ulcer of other part of left foot with fat layer exposed: Secondary | ICD-10-CM

## 2019-11-07 NOTE — Telephone Encounter (Signed)
-----   Message from Evelina Bucy, DPM sent at 11/03/2019  5:19 PM EDT ----- Can we order MRI left foot r/o osteomyelitis

## 2019-11-08 ENCOUNTER — Other Ambulatory Visit: Payer: Self-pay

## 2019-11-08 NOTE — Patient Outreach (Signed)
[name]) Care Management  11/08/2019  Kayla [name] 23-May-1940 [id number]    EMMI-General Discharge RED ON EMMI ALERT Day # 4 Date: 11/07/2019 Red Alert Reason: "Lost interest in things? Yes"   Outreach attempt #1 to patient. No answer at present. RN CM left HIPAA compliant voicemail message along with contact info.       Plan: RN CM will make outreach attempt to patient within 3-4 business days. RN CM will send unsuccessful outreach letter to patient.  Enzo Montgomery, RN,BSN,CCM [place] Management Telephonic Care Management Coordinator Direct Phone: [phone] Toll Free: [phone] Fax: [phone]

## 2019-11-09 ENCOUNTER — Other Ambulatory Visit: Payer: Self-pay

## 2019-11-09 NOTE — Patient Outreach (Signed)
[place] [facility]) Care Management  11/09/2019  Kayla [name] 08/25/1940 [id number]   EMMI-General Discharge RED ON EMMI ALERT Day # 4 Date: 11/07/2019 Red Alert Reason: "Lost interest in things? Yes"    Outreach attempt #2 to patient. No answer after multiple rings.      Plan: RN CM will make outreach attempt to patient within 3-4 business days.    Enzo Montgomery, RN,BSN,CCM [place] Management Telephonic Care Management Coordinator Direct Phone: [phone] Toll Free: [phone] Fax: [phone]

## 2019-11-10 ENCOUNTER — Other Ambulatory Visit: Payer: Self-pay

## 2019-11-10 ENCOUNTER — Encounter (HOSPITAL_COMMUNITY): Payer: Self-pay | Admitting: Physician Assistant

## 2019-11-10 ENCOUNTER — Ambulatory Visit (HOSPITAL_COMMUNITY)
Admission: RE | Admit: 2019-11-10 | Discharge: 2019-11-10 | Disposition: A | Discharge status: Home / Self Care | Payer: Medicare HMO | Source: Ambulatory Visit | Attending: Physician Assistant | Admitting: Physician Assistant

## 2019-11-10 VITALS — BP 112/60 | HR 61 | Ht 66.0 in | Wt 237.0 lb

## 2019-11-10 DIAGNOSIS — J449 Chronic obstructive pulmonary disease, unspecified: Secondary | ICD-10-CM | POA: Diagnosis not present

## 2019-11-10 DIAGNOSIS — I480 Paroxysmal atrial fibrillation: Secondary | ICD-10-CM

## 2019-11-10 DIAGNOSIS — Z950 Presence of cardiac pacemaker: Secondary | ICD-10-CM | POA: Insufficient documentation

## 2019-11-10 DIAGNOSIS — D6869 Other thrombophilia: Secondary | ICD-10-CM

## 2019-11-10 DIAGNOSIS — Z79890 Hormone replacement therapy (postmenopausal): Secondary | ICD-10-CM | POA: Insufficient documentation

## 2019-11-10 DIAGNOSIS — I5032 Chronic diastolic (congestive) heart failure: Secondary | ICD-10-CM | POA: Insufficient documentation

## 2019-11-10 DIAGNOSIS — Z833 Family history of diabetes mellitus: Secondary | ICD-10-CM | POA: Diagnosis not present

## 2019-11-10 DIAGNOSIS — E119 Type 2 diabetes mellitus without complications: Secondary | ICD-10-CM | POA: Diagnosis not present

## 2019-11-10 DIAGNOSIS — Z794 Long term (current) use of insulin: Secondary | ICD-10-CM | POA: Insufficient documentation

## 2019-11-10 DIAGNOSIS — K219 Gastro-esophageal reflux disease without esophagitis: Secondary | ICD-10-CM | POA: Diagnosis not present

## 2019-11-10 DIAGNOSIS — Z8249 Family history of ischemic heart disease and other diseases of the circulatory system: Secondary | ICD-10-CM | POA: Insufficient documentation

## 2019-11-10 DIAGNOSIS — Z7901 Long term (current) use of anticoagulants: Secondary | ICD-10-CM | POA: Insufficient documentation

## 2019-11-10 DIAGNOSIS — Z87891 Personal history of nicotine dependence: Secondary | ICD-10-CM | POA: Diagnosis not present

## 2019-11-10 DIAGNOSIS — E039 Hypothyroidism, unspecified: Secondary | ICD-10-CM | POA: Diagnosis not present

## 2019-11-10 DIAGNOSIS — E785 Hyperlipidemia, unspecified: Secondary | ICD-10-CM | POA: Diagnosis not present

## 2019-11-10 DIAGNOSIS — Z79899 Other long term (current) drug therapy: Secondary | ICD-10-CM | POA: Diagnosis not present

## 2019-11-10 DIAGNOSIS — I110 Hypertensive heart disease with heart failure: Secondary | ICD-10-CM | POA: Diagnosis not present

## 2019-11-10 DIAGNOSIS — I2510 Atherosclerotic heart disease of native coronary artery without angina pectoris: Secondary | ICD-10-CM | POA: Diagnosis not present

## 2019-11-10 DIAGNOSIS — G4733 Obstructive sleep apnea (adult) (pediatric): Secondary | ICD-10-CM | POA: Insufficient documentation

## 2019-11-10 LAB — BASIC METABOLIC PANEL
Anion gap: 13 (ref 5–15)
BUN: 54 mg/dL — ABNORMAL HIGH (ref 8–23)
CO2: 24 mmol/L (ref 22–32)
Calcium: 9.3 mg/dL (ref 8.9–10.3)
Chloride: 101 mmol/L (ref 98–111)
Creatinine, Ser: 1.46 mg/dL — ABNORMAL HIGH (ref 0.44–1.00)
GFR calc Af Amer: 40 mL/min — ABNORMAL LOW (ref >60)
GFR calc non Af Amer: 34 mL/min — ABNORMAL LOW (ref >60)
Glucose, Bld: 226 mg/dL — ABNORMAL HIGH (ref 70–99)
Potassium: 4.9 mmol/L (ref 3.5–5.1)
Sodium: 138 mmol/L (ref 135–145)

## 2019-11-10 LAB — MAGNESIUM: Magnesium: 1.9 mg/dL (ref 1.7–2.4)

## 2019-11-10 NOTE — Progress Notes (Signed)
Primary Care Physician: [name], MD Primary Cardiologist: Dr Marlou Porch Primary Electrophysiologist: Dr Caryl Comes Referring Physician: Dr Chriss Czar is a 79 y.o. female with a history of paroxysmal atrial fibrillation, CAD, COPD, tobacco abuse, DM, GERD, HTN, hypothyroidism, and OSA who presents for follow up in the [facility]. Patient noted to have paroxysmal atrial fibrillation noted on remote device transmission. Longest episode was about 7 days. Ventricular rates appear controlled. Patient states that she "thought everything was going great" until the device clinic called. She does report that she may have increase SOB over the last few weeks but this is improving. There were no specific triggers that the patient could identify. Patient was hospitalized 08/2019 with acute on chronic CHF. She was noted to be in rate controlled afib at the time. Device interrogation shows an increased afib burden (38.9%) with overall controlled V rates. She continues to have symptoms of fatigue and fluid retention when she is in afib.   On follow up today, patient is s/p dofetilide loading 6/28-11/02/19. She converted to SR on the medication and did not require DCCV. Patient reports that she feels improved on the medication with more energy and less SOB. She remains in SR today. She denies bleeding issues on anticoagulation.   Today, she denies symptoms of palpitations, chest pain, orthopnea, PND, dizziness, presyncope, syncope, bleeding, or neurologic sequela. The patient is tolerating medications without difficulties and is otherwise without complaint today.    Atrial Fibrillation Risk Factors:  she does have symptoms or diagnosis of sleep apnea. she is compliant with CPAP therapy.   she has a BMI of Body mass index is 38.25 kg/m.Marland Kitchen Filed Weights   11/10/19 1029  Weight: 107.5 kg    Family History  Problem Relation Age of Onset   Heart Problems Mother      Heart defect Mother        valve problems   Heart Problems Father    Heart disease Brother    Lung cancer Daughter        non small cell    Diabetes Maternal Aunt    Diabetes Maternal Uncle    Throat cancer Brother    Colon cancer Neg Hx    Colon polyps Neg Hx    Esophageal cancer Neg Hx    Rectal cancer Neg Hx    Stomach cancer Neg Hx      Atrial Fibrillation Management history:  Previous antiarrhythmic drugs: dofetilide Previous cardioversions: none Previous ablations: none CHADS2VASC score: 6 Anticoagulation history: Eliquis    Past Medical History:  Diagnosis Date   Allergy    Anxiety    Arthritis    CAD (coronary artery disease)    Mild per remote cath in 2006, /    Nuclear, January, 2013, no significant abnormality,   Carotid artery disease ([place])    Doppler, January, 2013, 0 32% RIC A., 35-57% LICA, stable   Cataract    CHF (congestive heart failure) ([place])    Complication of anesthesia    wakes up "mean"   COPD 02/16/2007   Intolerant of Spiriva, tudorza Intolerant of Trelegy    COPD (chronic obstructive pulmonary disease) ([place])    Coronary atherosclerosis 11/24/2008   Catheterization 2006, nonobstructive coronary disease   //   nuclear 2013, low risk     Diabetes mellitus, type 2 ([place])    Diverticula of colon    DIVERTICULOSIS OF COLON 03/23/2007   Qualifier: Diagnosis of  ByHalford Chessman MD, [place]     Dizziness    occasional   Dizzy spells 05/04/2011   DM (diabetes mellitus) ([place]) 03/23/2007   Qualifier: Diagnosis of  By: Halford Chessman MD, Vineet     Ejection fraction    Essential hypertension 02/16/2007   Qualifier: Diagnosis of  By: Rosana Hoes CMA, Tammy     G E R D 03/23/2007   Qualifier: Diagnosis of  By: Halford Chessman MD, Vineet     Gall bladder disease 04/28/2016   Gastritis and gastroduodenitis    GERD (gastroesophageal reflux disease)    Goiter    hx of   Headache(784.0)    migraine   Hyperlipidemia    Hypertension     Hypothyroidism    HYPOTHYROIDISM, POSTSURGICAL 06/04/2008   Qualifier: Diagnosis of  By: Loanne Drilling MD, Sean A    Left ventricular hypertrophy    Leg edema    occasional swelling   Leg swelling 12/09/2018   Myofascial pain syndrome, cervical 01/06/2018   Obesity    OBESITY 11/24/2008   Qualifier: Diagnosis of  By: Sidney Ace     OBSTRUCTIVE SLEEP APNEA 02/16/2007   Auto CPAP 09/03/13 to 10/02/13 >> used on 30 of 30 nights with average 6 hrs and 3 min.  Average AHI is 3.1 with median CPAP 5 cm H2O and 95 th percentile CPAP 6 cm H2O.     Occipital neuralgia of right side 01/06/2018   OSA (obstructive sleep apnea)    cpap setting of 12   Pain in joint of right hip 01/21/2018   Palpitations 01/21/2011   48 hour Holter, 2015, scattered PACs and PVCs.    PONV (postoperative nausea and vomiting)    severe after every surgery   Pulmonary hypertension, secondary    RHINITIS 07/21/2010        RUQ abdominal pain    S/P left TKA 07/27/2011   Sinus node dysfunction ([place]) 11/08/2015   Sleep apnea    Spinal stenosis of cervical region 11/07/2014   Urinary frequency 12/09/2018   UTI (urinary tract infection) 08/15/2019   per pt report   VENTRICULAR HYPERTROPHY, LEFT 11/24/2008   Qualifier: Diagnosis of  By: Sidney Ace     Past Surgical History:  Procedure Laterality Date   ABDOMINAL HYSTERECTOMY     with appendectomy and colon polypectomy   ANTERIOR CERVICAL DECOMP/DISCECTOMY FUSION N/A 11/07/2014   Procedure: Anterior Cervical diskectomy and fusion Cervical four-five, Cervical five-six, Cervical six-seven;  Surgeon: Kary Kos, MD;  Location: [facility];  Service: Neurosurgery;  Laterality: N/A;   APPENDECTOMY     ARTERY BIOPSY Right 02/16/2017   Procedure: BIOPSY TEMPORAL ARTERY;  Surgeon: Katha Cabal, MD;  Location: [facility];  Service: Vascular;  Laterality: Right;   BACK SURGERY     CARDIAC CATHETERIZATION     CATARACT EXTRACTION W/ INTRAOCULAR LENS  IMPLANT,  BILATERAL Bilateral    CHOLECYSTECTOMY N/A 04/29/2016   Procedure: LAPAROSCOPIC CHOLECYSTECTOMY WITH INTRAOPERATIVE CHOLANGIOGRAM;  Surgeon: Alphonsa Overall, MD;  Location: [facility];  Service: General;  Laterality: N/A;   EP IMPLANTABLE DEVICE N/A 11/08/2015   Procedure: Pacemaker Implant;  Surgeon: Deboraha Sprang, MD;  Location: [facility];  Service: Cardiovascular;  Laterality: N/A;   INSERT / REPLACE / REMOVE PACEMAKER  2017   Dr. Jens Som [facility])   JOINT REPLACEMENT     LUMBAR [place] SURGERY  1990's   x 2   THYROIDECTOMY  2011   TOTAL KNEE ARTHROPLASTY  07/27/2011   Procedure: TOTAL  KNEE ARTHROPLASTY;  Surgeon: Mauri Pole, MD;  Location: [facility];  Service: Orthopedics;  Laterality: Left;   UPPER ESOPHAGEAL ENDOSCOPIC ULTRASOUND (EUS) N/A 05/14/2016   Procedure: UPPER ESOPHAGEAL ENDOSCOPIC ULTRASOUND (EUS);  Surgeon: Milus Banister, MD;  Location: [facility];  Service: Endoscopy;  Laterality: N/A;  radial only-will be done mod if needed    Current Outpatient Medications  Medication Sig Dispense Refill   acetaminophen (TYLENOL) 500 MG tablet Take 1,000 mg by mouth every 6 (six) hours as needed for moderate pain (for headaches).      albuterol (PROVENTIL) (2.5 MG/3ML) 0.083% nebulizer solution Take 2.5 mg by nebulization every 2 (two) hours as needed for wheezing or shortness of breath.      albuterol (VENTOLIN HFA) 108 (90 Base) MCG/ACT inhaler Inhale 2 puffs into the lungs every 6 (six) hours as needed for wheezing or shortness of breath. 3 Inhaler 3   allopurinol (ZYLOPRIM) 100 MG tablet Take 100 mg by mouth 2 (two) times daily.      amLODipine (NORVASC) 5 MG tablet Take 1 tablet (5 mg total) by mouth daily. 90 tablet 3   apixaban (ELIQUIS) 5 MG TABS tablet Take 1 tablet (5 mg total) by mouth 2 (two) times daily. 180 tablet 2   atorvastatin (LIPITOR) 40 MG tablet Take 1 tablet (40 mg total) by mouth daily. 90 tablet 3   Cholecalciferol (VITAMIN D-3 PO) Take 1  capsule by mouth daily with breakfast.     Dextromethorphan-guaiFENesin (TUSSIN DM MAX SUGAR-FREE PO) Take 5 mLs by mouth every 4 (four) hours as needed (for coughing).     dofetilide (TIKOSYN) 250 MCG capsule Take 1 capsule (250 mcg total) by mouth 2 (two) times daily. 60 capsule 2   fluticasone (FLONASE) 50 MCG/ACT nasal spray USE 2 SPRAYS NASALLY DAILY 48 g 1   gabapentin (NEURONTIN) 800 MG tablet Take 800 mg by mouth See admin instructions. Take 800 mg by mouth three to four times a day     HYDROcodone-acetaminophen (NORCO/VICODIN) 5-325 MG tablet Take 1-2 tablets by mouth every 6 (six) hours as needed for moderate pain. 30 tablet 0   insulin aspart (NOVOLOG FLEXPEN) 100 UNIT/ML FlexPen Inject 8-13 Units into the skin See admin instructions. Inject 8-13 units into the skin three times a day with meals, per sliding scale (and an additional 1 unit for every 50 points for a BGL >130)     insulin glargine (LANTUS) 100 UNIT/ML injection Inject 130 Units into the skin in the morning and at bedtime.      levothyroxine (SYNTHROID) 175 MCG tablet Take 175 mcg by mouth daily before breakfast.      LORazepam (ATIVAN) 2 MG tablet Take 2 mg by mouth at bedtime as needed for anxiety.      losartan (COZAAR) 100 MG tablet Take 100 mg by mouth daily.      Magnesium Oxide 400 (240 Mg) MG TABS Take 1 tablet (400 mg total) by mouth daily. 30 tablet 3   metoprolol tartrate (LOPRESSOR) 50 MG tablet Take 50 mg by mouth in the morning and at bedtime.     nitroGLYCERIN (NITROSTAT) 0.4 MG SL tablet Place 1 tablet (0.4 mg total) under the tongue every 5 (five) minutes as needed for chest pain. 25 tablet 4   PRESCRIPTION MEDICATION CPAP: At bedtime and "as needed for shortness of breath"     promethazine (PHENADOZ) 25 MG suppository Place 25 mg rectally daily as needed (for headaches).  silver sulfADIAZINE (SILVADENE) 1 % cream Apply pea-sized amount to wound daily. (Patient taking differently: Apply 1  application topically daily. Apply to toes) 50 g 0   Tiotropium Bromide Monohydrate (SPIRIVA RESPIMAT) 2.5 MCG/ACT AERS Inhale 2 puffs into the lungs daily. 12 g 3   torsemide (DEMADEX) 20 MG tablet Take 1 tablet (20 mg total) by mouth 2 (two) times daily. 60 tablet 3   TRUE METRIX BLOOD GLUCOSE TEST test strip      vitamin B-12 (CYANOCOBALAMIN) 1000 MCG tablet Take 1,000 mcg by mouth daily.     budesonide-formoterol (SYMBICORT) 160-4.5 MCG/ACT inhaler Inhale 2 puffs into the lungs 2 (two) times daily for 1 day. 3 Inhaler 3   No current facility-administered medications for this encounter.    Allergies  Allergen Reactions   Dulaglutide Nausea Only, Other (See Comments) and Cough    Made the patient sick to her stomach (only) several days after using it    Social History   Socioeconomic History   Marital status: Married    Spouse name: Kayla [name]   Number of children: 3   Years of education: 12   Highest education level: Not on file  Occupational History   Occupation: retired  Tobacco Use   Smoking status: Former Smoker    Packs/day: 2.00    Years: 33.00    Pack years: 66.00    Types: Cigarettes    Start date: 1966    Quit date: 05/04/1997    Years since quitting: 22.5   Smokeless tobacco: Never Used  Vaping Use   Vaping Use: Never used  Substance and Sexual Activity   Alcohol use: No   Drug use: No   Sexual activity: Not on file  Other Topics Concern   Not on file  Social History Narrative   Patient lives at home with her husband Kayla [name] .   Retired.   Education 12 th grade.   Right handed   Caffeine two cups of coffee.   Social Determinants of Health   Financial Resource Strain:    Difficulty of Paying Living Expenses:   Food Insecurity:    Worried About Charity fundraiser in the Last Year:    Arboriculturist in the Last Year:   Transportation Needs:    Film/video editor (Medical):    Lack of Transportation (Non-Medical):   Physical  Activity:    Days of Exercise per Week:    Minutes of Exercise per Session:   Stress:    Feeling of Stress :   Social Connections:    Frequency of Communication with Friends and Family:    Frequency of Social Gatherings with Friends and Family:    Attends Religious Services:    Active Member of Clubs or Organizations:    Attends Music therapist:    Marital Status:   Intimate Partner Violence:    Fear of Current or Ex-Partner:    Emotionally Abused:    Physically Abused:    Sexually Abused:      ROS- All systems are reviewed and negative except as per the HPI above.  Physical Exam: Vitals:   11/10/19 1029  BP: 112/60  Pulse: 61  Weight: 107.5 kg  Height: 5\' 6"  (1.676 m)    GEN- The patient is well appearing obese elderly female, alert and oriented x 3 today.   HEENT-head normocephalic, atraumatic, sclera clear, conjunctiva pink, hearing intact, trachea midline. Lungs- Clear to ausculation bilaterally, normal work of breathing Heart- Regular  rate and rhythm, no murmurs, rubs or gallops  GI- soft, NT, ND, + BS Extremities- no clubbing, cyanosis. 1+ edema left leg (chronic) MS- no significant deformity or atrophy Skin- no rash or lesion Psych- euthymic mood, full affect Neuro- strength and sensation are intact   Wt Readings from Last 3 Encounters:  11/10/19 107.5 kg  11/02/19 106.6 kg  10/30/19 107.3 kg    EKG today demonstrates A paced rhythm HR 61, PR 240, QRS 70, QTc 450  Echo 08/13/19 demonstrated  1. Left ventricular ejection fraction, by estimation, is 65 to 70%. The  left ventricle has normal function. The left ventricle has no regional  wall motion abnormalities. There is moderate concentric left ventricular  hypertrophy. Unable to assess LV  diastolic filling due to underlying atrial fibrillation.  2. Right ventricular systolic function is normal. The right ventricular  size is normal. There is normal pulmonary artery systolic  pressure.  3. Right atrial size was moderately dilated.  4. The mitral valve is normal in structure. No evidence of mitral valve  regurgitation. No evidence of mitral stenosis.  5. The aortic valve is tricuspid. Aortic valve regurgitation is not  visualized. Mild aortic valve sclerosis is present, with no evidence of  aortic valve stenosis.  6. The inferior vena cava is dilated in size with >50% respiratory  variability, suggesting right atrial pressure of 8 mmHg.   Epic records are reviewed at length today  Assessment and Plan:  1. Paroxysmal atrial fibrillation Patient is s/p dofetilide loading. She appears to be maintaining SR.  Continue dofetilide 250 mcg BID. QT stable.  Check bmet/mag today. Continue Lopressor 50 mg BID Continue Eliquis 5 mg BID  This patients CHA2DS2-VASc Score and unadjusted Ischemic Stroke Rate (% per year) is equal to 9.7 % stroke rate/year from a score of 6  Above score calculated as 1 point each if present [CHF, HTN, DM, Vascular=MI/PAD/Aortic Plaque, Age if 65-74, or Female] Above score calculated as 2 points each if present [Age > 75, or Stroke/TIA/TE]  2. Obesity Body mass index is 38.25 kg/m. Lifestyle modification was discussed and encouraged including regular physical activity and weight reduction.  3. Obstructive sleep apnea Patient compliance with CPAP therapy.  4. CAD Cardiac CTA 12/31/17 with diffuse plaque with moderate disease in RCA and LAD. FFR showed stenosis in distal OM2.  No anginal symptoms.  5. Chronic diastolic CHF No signs or symptoms of fluid overload today.  6. HTN Stable, no changes today.  7. Sinus node dysfunction S/p PPM, followed by Dr Caryl Comes and the device clinic.   Follow up with Dr Marlou Porch and Tommye Standard as scheduled. AF clinic in 4 months.    [facility] [address] [place], [place] [address] [phone] 11/10/2019 11:02 AM

## 2019-11-10 NOTE — Patient Outreach (Signed)
[facility]) Care Management  11/10/2019  LINDIA GARMS 15-Jan-1941 [id number]   EMMI-General Discharge RED ON EMMI ALERT Day # 4 Date: 11/07/2019 Red Alert Reason: "Lost interest in things? Yes"   Outreach attempt #3 to patient. No answer after multiple rings.     Plan: RN CM will make outreach attempt to patient within 3 weeks.   Enzo Montgomery, RN,BSN,CCM [facility] Telephonic Care Management Coordinator Direct Phone: [phone] Toll Free: [phone] Fax: [phone]

## 2019-11-13 ENCOUNTER — Ambulatory Visit: Payer: Medicare HMO | Admitting: Cardiology

## 2019-11-13 ENCOUNTER — Encounter: Payer: Self-pay | Admitting: Cardiology

## 2019-11-13 ENCOUNTER — Other Ambulatory Visit: Payer: Self-pay

## 2019-11-13 VITALS — BP 140/50 | HR 82 | Ht 66.0 in | Wt 237.6 lb

## 2019-11-13 DIAGNOSIS — Z950 Presence of cardiac pacemaker: Secondary | ICD-10-CM

## 2019-11-13 DIAGNOSIS — I10 Essential (primary) hypertension: Secondary | ICD-10-CM | POA: Diagnosis not present

## 2019-11-13 DIAGNOSIS — I5032 Chronic diastolic (congestive) heart failure: Secondary | ICD-10-CM | POA: Diagnosis not present

## 2019-11-13 DIAGNOSIS — I480 Paroxysmal atrial fibrillation: Secondary | ICD-10-CM

## 2019-11-13 NOTE — Progress Notes (Signed)
Cardiology Office Note:    Date:  11/13/2019   ID:  Kayla [name], DOB [birth year], MRN [id number]  PCP:  Kayla Sake, MD  [facility] Cardiologist:  Candee Furbish, MD  [facility] Electrophysiologist:  None   Referring MD: Kayla Sake, MD     History of Present Illness:    Kayla [name] is a 79 y.o. female here for the follow-up of paroxysmal atrial fibrillation, recent dofetilide initiation, sinus rhythm, pacemaker, moderate CAD.  Recently saw atrial fibrillation clinic.  Has also seen Dr. Caryl Comes.  Recent device transmission demonstrated episode of 7 days of PAF.  Ventricular rate controlled.  She felt as though everything was going great until the device clinic called.  Shortness of breath improved.  No specific triggers.  Her device showed atrial fibrillation burden of 39%.  Previous hospitalization in April 4580 with diastolic heart failure.  A. fib at the time rate controlled.  She underwent dofetilide loading on 6/28-7/1 and converted without cardioversion.  Doing well remaining in sinus rhythm.  Overall she is doing well maintaining sinus rhythm.  No syncope no bleeding.  No chest pain.  She has been having back pain.  When asked, device clinic states that her pacemaker is MRI compatible.  Past Medical History:  Diagnosis Date   Allergy    Anxiety    Arthritis    CAD (coronary artery disease)    Mild per remote cath in 2006, /    Nuclear, January, 2013, no significant abnormality,   Carotid artery disease ([place])    Doppler, January, 2013, 0 99% RIC A., 83-38% LICA, stable   Cataract    CHF (congestive heart failure) ([place])    Complication of anesthesia    wakes up "mean"   COPD 02/16/2007   Intolerant of Spiriva, tudorza Intolerant of Trelegy    COPD (chronic obstructive pulmonary disease) ([place])    Coronary atherosclerosis 11/24/2008   Catheterization 2006, nonobstructive coronary disease   //   nuclear 2013, low risk     Diabetes  mellitus, type 2 ([place])    Diverticula of colon    DIVERTICULOSIS OF COLON 03/23/2007   Qualifier: Diagnosis of  By: Halford Chessman MD, Vineet     Dizziness    occasional   Dizzy spells 05/04/2011   DM (diabetes mellitus) ([place]) 03/23/2007   Qualifier: Diagnosis of  By: Halford Chessman MD, Vineet     Ejection fraction    Essential hypertension 02/16/2007   Qualifier: Diagnosis of  By: Rosana Hoes CMA, Tammy     G E R D 03/23/2007   Qualifier: Diagnosis of  By: Halford Chessman MD, Vineet     Gall bladder disease 04/28/2016   Gastritis and gastroduodenitis    GERD (gastroesophageal reflux disease)    Goiter    hx of   Headache(784.0)    migraine   Hyperlipidemia    Hypertension    Hypothyroidism    HYPOTHYROIDISM, POSTSURGICAL 06/04/2008   Qualifier: Diagnosis of  By: Loanne Drilling MD, Sean A    Left ventricular hypertrophy    Leg edema    occasional swelling   Leg swelling 12/09/2018   Myofascial pain syndrome, cervical 01/06/2018   Obesity    OBESITY 11/24/2008   Qualifier: Diagnosis of  By: Sidney Ace     OBSTRUCTIVE SLEEP APNEA 02/16/2007   Auto CPAP 09/03/13 to 10/02/13 >> used on 30 of 30 nights with average 6 hrs and 3 min.  Average AHI is 3.1 with median CPAP 5 cm H2O  and 95 th percentile CPAP 6 cm H2O.     Occipital neuralgia of right side 01/06/2018   OSA (obstructive sleep apnea)    cpap setting of 12   Pain in joint of right hip 01/21/2018   Palpitations 01/21/2011   48 hour Holter, 2015, scattered PACs and PVCs.    PONV (postoperative nausea and vomiting)    severe after every surgery   Pulmonary hypertension, secondary    RHINITIS 07/21/2010        RUQ abdominal pain    S/P left TKA 07/27/2011   Sinus node dysfunction ([place]) 11/08/2015   Sleep apnea    Spinal stenosis of cervical region 11/07/2014   Urinary frequency 12/09/2018   UTI (urinary tract infection) 08/15/2019   per pt report   VENTRICULAR HYPERTROPHY, LEFT 11/24/2008   Qualifier: Diagnosis of  By: Sidney Ace       Past Surgical History:  Procedure Laterality Date   ABDOMINAL HYSTERECTOMY     with appendectomy and colon polypectomy   ANTERIOR CERVICAL DECOMP/DISCECTOMY FUSION N/A 11/07/2014   Procedure: Anterior Cervical diskectomy and fusion Cervical four-five, Cervical five-six, Cervical six-seven;  Surgeon: Kary Kos, MD;  Location: [place] [facility];  Service: Neurosurgery;  Laterality: N/A;   APPENDECTOMY     ARTERY BIOPSY Right 02/16/2017   Procedure: BIOPSY TEMPORAL ARTERY;  Surgeon: Katha Cabal, MD;  Location: [facility] ORS;  Service: Vascular;  Laterality: Right;   BACK SURGERY     CARDIAC CATHETERIZATION     CATARACT EXTRACTION W/ INTRAOCULAR LENS  IMPLANT, BILATERAL Bilateral    CHOLECYSTECTOMY N/A 04/29/2016   Procedure: LAPAROSCOPIC CHOLECYSTECTOMY WITH INTRAOPERATIVE CHOLANGIOGRAM;  Surgeon: Alphonsa Overall, MD;  Location: [facility];  Service: General;  Laterality: N/A;   EP IMPLANTABLE DEVICE N/A 11/08/2015   MRI COMPATABLE -- Procedure: Pacemaker Implant;  Surgeon: Deboraha Sprang, MD;  Location: [facility];  Service: Cardiovascular;  Laterality: N/A;   INSERT / REPLACE / REMOVE PACEMAKER  2017   Dr. Jens Som [facility])   JOINT REPLACEMENT     LUMBAR [place] SURGERY  1990's   x 2   THYROIDECTOMY  2011   TOTAL KNEE ARTHROPLASTY  07/27/2011   Procedure: TOTAL KNEE ARTHROPLASTY;  Surgeon: Mauri Pole, MD;  Location: [facility];  Service: Orthopedics;  Laterality: Left;   UPPER ESOPHAGEAL ENDOSCOPIC ULTRASOUND (EUS) N/A 05/14/2016   Procedure: UPPER ESOPHAGEAL ENDOSCOPIC ULTRASOUND (EUS);  Surgeon: Milus Banister, MD;  Location: [facility];  Service: Endoscopy;  Laterality: N/A;  radial only-will be done mod if needed    Current Medications: Current Meds  Medication Sig   acetaminophen (TYLENOL) 500 MG tablet Take 1,000 mg by mouth every 6 (six) hours as needed for moderate pain (for headaches).    albuterol (PROVENTIL) (2.5 MG/3ML) 0.083% nebulizer solution Take 2.5 mg  by nebulization every 2 (two) hours as needed for wheezing or shortness of breath.    albuterol (VENTOLIN HFA) 108 (90 Base) MCG/ACT inhaler Inhale 2 puffs into the lungs every 6 (six) hours as needed for wheezing or shortness of breath.   allopurinol (ZYLOPRIM) 100 MG tablet Take 100 mg by mouth 2 (two) times daily.    amLODipine (NORVASC) 5 MG tablet Take 1 tablet (5 mg total) by mouth daily.   apixaban (ELIQUIS) 5 MG TABS tablet Take 1 tablet (5 mg total) by mouth 2 (two) times daily.   atorvastatin (LIPITOR) 40 MG tablet Take 1 tablet (40 mg total) by mouth daily.   budesonide-formoterol (SYMBICORT)  160-4.5 MCG/ACT inhaler Inhale 2 puffs into the lungs 2 (two) times daily for 1 day.   Cholecalciferol (VITAMIN D-3 PO) Take 1 capsule by mouth daily with breakfast.   Dextromethorphan-guaiFENesin (TUSSIN DM MAX SUGAR-FREE PO) Take 5 mLs by mouth every 4 (four) hours as needed (for coughing).   dofetilide (TIKOSYN) 250 MCG capsule Take 1 capsule (250 mcg total) by mouth 2 (two) times daily.   fluticasone (FLONASE) 50 MCG/ACT nasal spray USE 2 SPRAYS NASALLY DAILY   gabapentin (NEURONTIN) 800 MG tablet Take 800 mg by mouth See admin instructions. Take 800 mg by mouth three to four times a day   HYDROcodone-acetaminophen (NORCO/VICODIN) 5-325 MG tablet Take 1-2 tablets by mouth every 6 (six) hours as needed for moderate pain.   insulin aspart (NOVOLOG FLEXPEN) 100 UNIT/ML FlexPen Inject 8-13 Units into the skin See admin instructions. Inject 8-13 units into the skin three times a day with meals, per sliding scale (and an additional 1 unit for every 50 points for a BGL >130)   insulin glargine (LANTUS) 100 UNIT/ML injection Inject 130 Units into the skin in the morning and at bedtime.    levothyroxine (SYNTHROID) 175 MCG tablet Take 175 mcg by mouth daily before breakfast.    LORazepam (ATIVAN) 2 MG tablet Take 2 mg by mouth at bedtime as needed for anxiety.    losartan (COZAAR) 100 MG  tablet Take 100 mg by mouth daily.    Magnesium Oxide 400 (240 Mg) MG TABS Take 1 tablet (400 mg total) by mouth daily.   metoprolol tartrate (LOPRESSOR) 50 MG tablet Take 50 mg by mouth in the morning and at bedtime.   nitroGLYCERIN (NITROSTAT) 0.4 MG SL tablet Place 1 tablet (0.4 mg total) under the tongue every 5 (five) minutes as needed for chest pain.   PRESCRIPTION MEDICATION CPAP: At bedtime and "as needed for shortness of breath"   promethazine (PHENADOZ) 25 MG suppository Place 25 mg rectally daily as needed (for headaches).    silver sulfADIAZINE (SILVADENE) 1 % cream Apply pea-sized amount to wound daily.   Tiotropium Bromide Monohydrate (SPIRIVA RESPIMAT) 2.5 MCG/ACT AERS Inhale 2 puffs into the lungs daily.   torsemide (DEMADEX) 20 MG tablet Take 1 tablet (20 mg total) by mouth 2 (two) times daily.   TRUE METRIX BLOOD GLUCOSE TEST test strip    vitamin B-12 (CYANOCOBALAMIN) 1000 MCG tablet Take 1,000 mcg by mouth daily.     Allergies:   Dulaglutide   Social History   Socioeconomic History   Marital status: Married    Spouse name: Jeneen Rinks   Number of children: 3   Years of education: 12   Highest education level: Not on file  Occupational History   Occupation: retired  Tobacco Use   Smoking status: Former Smoker    Packs/day: 2.00    Years: 33.00    Pack years: 66.00    Types: Cigarettes    Start date: 1966    Quit date: 05/04/1997    Years since quitting: 22.5   Smokeless tobacco: Never Used  Vaping Use   Vaping Use: Never used  Substance and Sexual Activity   Alcohol use: No   Drug use: No   Sexual activity: Not on file  Other Topics Concern   Not on file  Social History Narrative   Patient lives at home with her husband Jeneen Rinks .   Retired.   Education 12 th grade.   Right handed   Caffeine two cups of coffee.  Social Determinants of Health   Financial Resource Strain:    Difficulty of Paying Living Expenses:   Food Insecurity:     Worried About Charity fundraiser in the Last Year:    Arboriculturist in the Last Year:   Transportation Needs:    Film/video editor (Medical):    Lack of Transportation (Non-Medical):   Physical Activity:    Days of Exercise per Week:    Minutes of Exercise per Session:   Stress:    Feeling of Stress :   Social Connections:    Frequency of Communication with Friends and Family:    Frequency of Social Gatherings with Friends and Family:    Attends Religious Services:    Active Member of Clubs or Organizations:    Attends Archivist Meetings:    Marital Status:      Family History: The patient's family history includes Diabetes in her maternal aunt and maternal uncle; Heart Problems in her father and mother; Heart defect in her mother; Heart disease in her brother; Lung cancer in her daughter; Throat cancer in her brother. There is no history of Colon cancer, Colon polyps, Esophageal cancer, Rectal cancer, or Stomach cancer.  ROS:   Please see the history of present illness.     All other systems reviewed and are negative.  EKGs/Labs/Other Studies Reviewed:    The following studies were reviewed today: Echo 65 to 70% EF  EKG:  11/10/2019 demonstrates atrial pacing QTC 450 stable on dofetilide  Recent Labs: 12/09/2018: Pro B Natriuretic peptide (BNP) 121.0 08/16/2019: ALT 16 10/03/2019: B Natriuretic Peptide 69.3 10/17/2019: Hemoglobin 9.6; Platelets 504; TSH 2.640 11/10/2019: BUN 54; Creatinine, Ser 1.46; Magnesium 1.9; Potassium 4.9; Sodium 138  Recent Lipid Panel No results found for: CHOL, TRIG, HDL, CHOLHDL, VLDL, LDLCALC, LDLDIRECT  Physical Exam:    VS:  BP (!) 140/50    Pulse 82    Ht 5\' 6"  (1.676 m)    Wt 237 lb 9.6 oz (107.8 kg)    SpO2 96%    BMI 38.35 kg/m     Wt Readings from Last 3 Encounters:  11/13/19 237 lb 9.6 oz (107.8 kg)  11/10/19 237 lb (107.5 kg)  11/02/19 235 lb 1.6 oz (106.6 kg)     GEN:  Well nourished, well developed  in no acute distress HEENT: Normal NECK: No JVD; No carotid bruits LYMPHATICS: No lymphadenopathy CARDIAC: RRR, no murmurs, rubs, gallops, pacer RESPIRATORY:  Clear to auscultation without rales, wheezing or rhonchi  ABDOMEN: Soft, non-tender, non-distended MUSCULOSKELETAL:  No edema; No deformity  SKIN: Warm and dry NEUROLOGIC:  Alert and oriented x 3 PSYCHIATRIC:  Normal affect   ASSESSMENT:    1. Paroxysmal atrial fibrillation (HCC)   2. Cardiac pacemaker in situ   3. Essential hypertension   4. Chronic diastolic heart failure (HCC)    PLAN:    In order of problems listed above:  Paroxysmal atrial fibrillation -Now on dofetilide since early July 2021.  Dr. Caryl Comes.  Maintaining sinus rhythm.  Chemically converted.  She is on 250 mcg twice a day.  QT interval is stable.  Lab work has been stable electrolytes magnesium potassium. -Continuing with metoprolol 50 twice a day  Chronic anticoagulation -Continue with Eliquis 5 twice a day.  Last hemoglobin 10.4 last creatinine 1.6.  She reports no bleeding  Obesity, morbid -Continue to encourage weight loss BMI 38 most recently.  BMI greater than 35 with 2 or more comorbidities  Obstructive sleep apnea -Continue with CPAP therapy.  Coronary artery disease -Coronary CTA in 2019 showed diffuse plaque with moderate disease in the RCA and LAD.  FFR did show some distal obtuse marginal stenosis but this was treated medically.  Chronic diastolic heart failure -Continue to maintain fluid balance.  Medications.  Maintaining sinus rhythm.  Sinus node dysfunction -Pacemaker, Dr. Caryl Comes.  Device clinic. -She has been having back pain.  Pacemaker is MRI compatible if she does need an MRI for her back pain.  Essential hypertension -Stable.  Medications reviewed.  Medication Adjustments/Labs and Tests Ordered: Current medicines are reviewed at length with the patient today.  Concerns regarding medicines are outlined above.  No orders of  the defined types were placed in this encounter.  No orders of the defined types were placed in this encounter.   Patient Instructions  Medication Instructions:  The current medical regimen is effective;  continue present plan and medications.  *If you need a refill on your cardiac medications before your next appointment, please call your pharmacy*  Follow-Up: At [facility], you and your health needs are our priority.  As part of our continuing mission to provide you with exceptional heart care, we have created designated Provider Care Teams.  These Care Teams include your primary Cardiologist (physician) and Advanced Practice Providers (APPs -  Physician Assistants and Nurse Practitioners) who all work together to provide you with the care you need, when you need it.  We recommend signing up for the patient portal called "MyChart".  Sign up information is provided on this After Visit Summary.  MyChart is used to connect with patients for Virtual Visits (Telemedicine).  Patients are able to view lab/test results, encounter notes, upcoming appointments, etc.  Non-urgent messages can be sent to your provider as well.   To learn more about what you can do with MyChart, go to [email or web address].    Your next appointment:   6 month(s)  The format for your next appointment:   In Person  Provider:   Candee Furbish, MD  Thank you for choosing [facility]!!         Signed, Candee Furbish, MD  11/13/2019 8:47 AM    [place]

## 2019-11-13 NOTE — Telephone Encounter (Signed)
[employer] PA:  [phone]. Faxed to [place].

## 2019-11-13 NOTE — Patient Instructions (Signed)

## 2019-11-14 DIAGNOSIS — M545 Low back pain: Secondary | ICD-10-CM | POA: Diagnosis not present

## 2019-11-14 DIAGNOSIS — I5030 Unspecified diastolic (congestive) heart failure: Secondary | ICD-10-CM | POA: Diagnosis not present

## 2019-11-14 DIAGNOSIS — I480 Paroxysmal atrial fibrillation: Secondary | ICD-10-CM | POA: Diagnosis not present

## 2019-11-14 DIAGNOSIS — Z6837 Body mass index (BMI) 37.0-37.9, adult: Secondary | ICD-10-CM | POA: Diagnosis not present

## 2019-11-14 DIAGNOSIS — G8929 Other chronic pain: Secondary | ICD-10-CM | POA: Diagnosis not present

## 2019-11-14 DIAGNOSIS — Z7689 Persons encountering health services in other specified circumstances: Secondary | ICD-10-CM | POA: Diagnosis not present

## 2019-11-16 ENCOUNTER — Other Ambulatory Visit: Payer: Self-pay

## 2019-11-16 ENCOUNTER — Ambulatory Visit: Payer: Medicare HMO | Admitting: Podiatry

## 2019-11-16 DIAGNOSIS — L02612 Cutaneous abscess of left foot: Secondary | ICD-10-CM

## 2019-11-16 DIAGNOSIS — L97522 Non-pressure chronic ulcer of other part of left foot with fat layer exposed: Secondary | ICD-10-CM | POA: Diagnosis not present

## 2019-11-16 DIAGNOSIS — L03032 Cellulitis of left toe: Secondary | ICD-10-CM

## 2019-11-16 MED ORDER — DOXYCYCLINE HYCLATE 100 MG PO TABS
100.0000 mg | ORAL_TABLET | Freq: Two times a day (BID) | ORAL | 0 refills | Status: DC
Start: 2019-11-16 — End: 2019-11-24

## 2019-11-16 NOTE — Telephone Encounter (Signed)
[facility] - Kayla [name] states they do not perform MRI on pt's with pacemakers, will need to have performed at [facility], but can do CT in their facility.

## 2019-11-16 NOTE — Progress Notes (Signed)
  Subjective:  Patient ID: Kayla [name], female    DOB: 12-31-1940,  MRN: [id number]  Chief Complaint  Patient presents with  . Wound Check    Pt states "Why does my foot swell?". Denies fever/chills/nausea/vomiting.   79 y.o. female presents for wound care. Hx confirmed with patient.  Objective:  Physical Exam: Wound Location: Left hallux Wound Measurement: 2x1 Wound Base: Mixed Granular/Fibrotic Peri-wound: Calloused Exudate: Scant/small amount Serosanguinous exudate erythema noted along wound margins extending 1 cm dorsally   No images are attached to the encounter. Assessment:   1. Skin ulcer of left great toe with fat layer exposed ([place])   2. Cellulitis and abscess of toe of left foot    Plan:  Patient was evaluated and treated and all questions answered.  Ulcer left hallux -Dressing applied with betadine WTD. -Offload ulcer with surgical shoe -Wound cleansed and debrided -Ultimately may need surgical correction however we will have to evaluate if she is a good sx candidate. -Pending MRI. Will figure out whether she can still get with her pacemaker or whether she will need CT. -Rx doxycycline for periwound cellulitis  Procedure: Selective Debridement of Wound Rationale: Removal of devitalized tissue from the wound to promote healing.  Pre-Debridement Wound Measurements: 2 cm x 1 cm x 0.2 cm  Post-Debridement Wound Measurements: same as pre-debridement. Type of Debridement: sharp selective Tissue Removed: Devitalized soft-tissue Dressing: Dry, sterile, compression dressing. Disposition: Patient tolerated procedure well. Patient to return in 1 week for follow-up.   Hallux Valgus -Pending possible surgery  No follow-ups on file.

## 2019-11-16 NOTE — Addendum Note (Signed)
Addended by: Harriett Sine D on: 11/16/2019 01:54 PM   Modules accepted: Orders

## 2019-11-16 NOTE — Telephone Encounter (Addendum)
I called [employer] [facility] [place] to change location to [facility] NPI: [id number], and was told I had to speak with [facility] [phone] to change location and I was transferred to [place], she reviewed and states [facility] is in-network and the PA remains and dates valid remain. Faxed orders and PA to [facility].

## 2019-11-17 ENCOUNTER — Ambulatory Visit: Payer: Medicare HMO | Admitting: Physical Therapy

## 2019-11-21 ENCOUNTER — Encounter: Payer: Medicare HMO | Admitting: Physical Therapy

## 2019-11-23 ENCOUNTER — Encounter: Payer: Medicare HMO | Admitting: Physical Therapy

## 2019-11-24 ENCOUNTER — Other Ambulatory Visit: Payer: Self-pay

## 2019-11-24 ENCOUNTER — Encounter: Payer: Self-pay | Admitting: Podiatry

## 2019-11-24 ENCOUNTER — Ambulatory Visit: Payer: Medicare HMO | Admitting: Physician Assistant

## 2019-11-24 ENCOUNTER — Ambulatory Visit: Payer: Medicare HMO | Admitting: Podiatry

## 2019-11-24 VITALS — BP 124/46 | HR 76 | Ht 66.0 in | Wt 239.0 lb

## 2019-11-24 DIAGNOSIS — I5032 Chronic diastolic (congestive) heart failure: Secondary | ICD-10-CM

## 2019-11-24 DIAGNOSIS — I517 Cardiomegaly: Secondary | ICD-10-CM

## 2019-11-24 DIAGNOSIS — I2510 Atherosclerotic heart disease of native coronary artery without angina pectoris: Secondary | ICD-10-CM

## 2019-11-24 DIAGNOSIS — I480 Paroxysmal atrial fibrillation: Secondary | ICD-10-CM | POA: Diagnosis not present

## 2019-11-24 DIAGNOSIS — Z79899 Other long term (current) drug therapy: Secondary | ICD-10-CM | POA: Diagnosis not present

## 2019-11-24 DIAGNOSIS — L97522 Non-pressure chronic ulcer of other part of left foot with fat layer exposed: Secondary | ICD-10-CM | POA: Diagnosis not present

## 2019-11-24 DIAGNOSIS — Z950 Presence of cardiac pacemaker: Secondary | ICD-10-CM

## 2019-11-24 DIAGNOSIS — Z5181 Encounter for therapeutic drug level monitoring: Secondary | ICD-10-CM | POA: Diagnosis not present

## 2019-11-24 NOTE — Progress Notes (Signed)
Cardiology Office Note Date:  11/24/2019  Patient ID:  Kayla, [name] 26-Dec-1940, MRN [id number] PCP:  [name], MD  Cardiologist:  Dr. Marlou Porch EP: Dr. Caryl Comes    Chief Complaint: post Tikosyn start visit  History of Present Illness: Kayla [name] is a 79 y.o. female with history of COPD (Dr. Halford Chessman), smoker, chronic DOE, DM, HLD, GERD, HTN, hypothyroidism, OSA w/CPAP, symptomatic bradycardia w/PPM, chronic CHF (diastolic), CAD (CTA 6/83/41 with diffuse plaque with moderate disease in RCA and LAD. FFR showed stenosis in distal OM2)  and AFib  She underwent Tikosyn initiation, discharged 11/02/2019.  She was seen at her AFib clinic visit 11/10/19 in [facility] (A paced), QTc was stable She saw Dr. Marlou Porch 11/13/19, cardiac-wise reported doing OK, had c/o back pain.  She comes in today to be seen for Dr. Caryl Comes for her 1 mo Tikosyn visit.  She is doing "OK". Mentions that the numbness she has in her hands and feet are worse of late, does have peripheral neuropathy.  No weakness, or pain. No palpitations She is at her baseline with her COPD, no unusual SOB or DOE.  No dizzy spells, near syncope or syncope No bleeding or signs of bleeding, reports compliance with her Eliquis.  She does report some CP.  2 episodes in the last few weeks, she can not remember if she mentioned them to Dr. Marlou Porch.  They felt sharp, center/left chest, non radiating, no associated symptoms.  Both occurred while at rest, no changes with position or with ambulation, she took s/l NTG both times and did go away. No CP with ambulation or exertional, none in the last few days (noting PAFib the last 2 days)   Device information: MDT dual chamber PPM implanted 11/08/15, Dr. Caryl Comes, sinus node dyfunction, bradycardia, RV is in HIS posiion   Past Medical History:  Diagnosis Date   Allergy    Anxiety    Arthritis    CAD (coronary artery disease)    Mild per remote cath in 2006, /    Nuclear, January, 2013, no  significant abnormality,   Carotid artery disease ([place])    Doppler, January, 2013, 0 96% RIC A., 22-29% LICA, stable   Cataract    CHF (congestive heart failure) ([place])    Complication of anesthesia    wakes up "mean"   COPD 02/16/2007   Intolerant of Spiriva, tudorza Intolerant of Trelegy    COPD (chronic obstructive pulmonary disease) ([place])    Coronary atherosclerosis 11/24/2008   Catheterization 2006, nonobstructive coronary disease   //   nuclear 2013, low risk     Diabetes mellitus, type 2 ([place])    Diverticula of colon    DIVERTICULOSIS OF COLON 03/23/2007   Qualifier: Diagnosis of  By: Halford Chessman MD, Vine[place]     Dizziness    occasional   Dizzy spells 05/04/2011   DM (diabetes mellitus) ([place]) 03/23/2007   Qualifier: Diagnosis of  By: Halford Chessman MD, Vineet     Ejection fraction    Essential hypertension 02/16/2007   Qualifier: Diagnosis of  By: Rosana Hoes CMA, Tammy     G E R D 03/23/2007   Qualifier: Diagnosis of  By: Halford Chessman MD, Vineet     Gall bladder disease 04/28/2016   Gastritis and gastroduodenitis    GERD (gastroesophageal reflux disease)    Goiter    hx of   Headache(784.0)    migraine   Hyperlipidemia    Hypertension    Hypothyroidism    HYPOTHYROIDISM,  POSTSURGICAL 06/04/2008   Qualifier: Diagnosis of  By: Loanne Drilling MD, Sean A    Left ventricular hypertrophy    Leg edema    occasional swelling   Leg swelling 12/09/2018   Myofascial pain syndrome, cervical 01/06/2018   Obesity    OBESITY 11/24/2008   Qualifier: Diagnosis of  By: Jaramillo, [place] 02/16/2007   Auto CPAP 09/03/13 to 10/02/13 >> used on 30 of 30 nights with average 6 hrs and 3 min.  Average AHI is 3.1 with median CPAP 5 cm H2O and 95 th percentile CPAP 6 cm H2O.     Occipital neuralgia of right side 01/06/2018   OSA (obstructive sleep apnea)    cpap setting of 12   Pain in joint of right hip 01/21/2018   Palpitations 01/21/2011   48 hour Holter, 2015, scattered  PACs and PVCs.    PONV (postoperative nausea and vomiting)    severe after every surgery   Pulmonary hypertension, secondary    RHINITIS 07/21/2010        RUQ abdominal pain    S/P left TKA 07/27/2011   Sinus node dysfunction ([place]) 11/08/2015   Sleep apnea    Spinal stenosis of cervical region 11/07/2014   Urinary frequency 12/09/2018   UTI (urinary tract infection) 08/15/2019   per pt report   VENTRICULAR HYPERTROPHY, LEFT 11/24/2008   Qualifier: Diagnosis of  By: Sidney Ace      Past Surgical History:  Procedure Laterality Date   ABDOMINAL HYSTERECTOMY     with appendectomy and colon polypectomy   ANTERIOR CERVICAL DECOMP/DISCECTOMY FUSION N/A 11/07/2014   Procedure: Anterior Cervical diskectomy and fusion Cervical four-five, Cervical five-six, Cervical six-seven;  Surgeon: Kary Kos, MD;  Location: [facility];  Service: Neurosurgery;  Laterality: N/A;   APPENDECTOMY     ARTERY BIOPSY Right 02/16/2017   Procedure: BIOPSY TEMPORAL ARTERY;  Surgeon: Katha Cabal, MD;  Location: [facility] ORS;  Service: Vascular;  Laterality: Right;   BACK SURGERY     CARDIAC CATHETERIZATION     CATARACT EXTRACTION W/ INTRAOCULAR LENS  IMPLANT, BILATERAL Bilateral    CHOLECYSTECTOMY N/A 04/29/2016   Procedure: LAPAROSCOPIC CHOLECYSTECTOMY WITH INTRAOPERATIVE CHOLANGIOGRAM;  Surgeon: Alphonsa Overall, MD;  Location: [facility];  Service: General;  Laterality: N/A;   EP IMPLANTABLE DEVICE N/A 11/08/2015   MRI COMPATABLE -- Procedure: Pacemaker Implant;  Surgeon: Deboraha Sprang, MD;  Location: [facility];  Service: Cardiovascular;  Laterality: N/A;   INSERT / REPLACE / REMOVE PACEMAKER  2017   Dr. Jens Som [facility])   JOINT REPLACEMENT     LUMBAR [place] SURGERY  1990's   x 2   THYROIDECTOMY  2011   TOTAL KNEE ARTHROPLASTY  07/27/2011   Procedure: TOTAL KNEE ARTHROPLASTY;  Surgeon: Mauri Pole, MD;  Location: [facility];  Service: Orthopedics;  Laterality: Left;   UPPER ESOPHAGEAL  ENDOSCOPIC ULTRASOUND (EUS) N/A 05/14/2016   Procedure: UPPER ESOPHAGEAL ENDOSCOPIC ULTRASOUND (EUS);  Surgeon: Milus Banister, MD;  Location: [facility];  Service: Endoscopy;  Laterality: N/A;  radial only-will be done mod if needed    Current Outpatient Medications  Medication Sig Dispense Refill   acetaminophen (TYLENOL) 500 MG tablet Take 1,000 mg by mouth every 6 (six) hours as needed for moderate pain (for headaches).      albuterol (PROVENTIL) (2.5 MG/3ML) 0.083% nebulizer solution Take 2.5 mg by nebulization every 2 (two) hours as needed for wheezing or shortness of breath.  albuterol (VENTOLIN HFA) 108 (90 Base) MCG/ACT inhaler Inhale 2 puffs into the lungs every 6 (six) hours as needed for wheezing or shortness of breath. 3 Inhaler 3   allopurinol (ZYLOPRIM) 100 MG tablet Take 100 mg by mouth 2 (two) times daily.      amLODipine (NORVASC) 5 MG tablet Take 1 tablet (5 mg total) by mouth daily. 90 tablet 3   apixaban (ELIQUIS) 5 MG TABS tablet Take 1 tablet (5 mg total) by mouth 2 (two) times daily. 180 tablet 2   atorvastatin (LIPITOR) 40 MG tablet Take 1 tablet (40 mg total) by mouth daily. 90 tablet 3   budesonide-formoterol (SYMBICORT) 160-4.5 MCG/ACT inhaler Inhale 2 puffs into the lungs 2 (two) times daily for 1 day. 3 Inhaler 3   Cholecalciferol (VITAMIN D-3 PO) Take 1 capsule by mouth daily with breakfast.     Dextromethorphan-guaiFENesin (TUSSIN DM MAX SUGAR-FREE PO) Take 5 mLs by mouth every 4 (four) hours as needed (for coughing).     dofetilide (TIKOSYN) 250 MCG capsule Take 1 capsule (250 mcg total) by mouth 2 (two) times daily. 60 capsule 2   fluticasone (FLONASE) 50 MCG/ACT nasal spray USE 2 SPRAYS NASALLY DAILY 48 g 1   gabapentin (NEURONTIN) 800 MG tablet Take 800 mg by mouth See admin instructions. Take 800 mg by mouth three to four times a day     HYDROcodone-acetaminophen (NORCO/VICODIN) 5-325 MG tablet Take 1-2 tablets by mouth every 6 (six) hours  as needed for moderate pain. 30 tablet 0   insulin aspart (NOVOLOG FLEXPEN) 100 UNIT/ML FlexPen Inject 8-13 Units into the skin See admin instructions. Inject 8-13 units into the skin three times a day with meals, per sliding scale (and an additional 1 unit for every 50 points for a BGL >130)     insulin glargine (LANTUS) 100 UNIT/ML injection Inject 130 Units into the skin in the morning and at bedtime.      levothyroxine (SYNTHROID) 175 MCG tablet Take 175 mcg by mouth daily before breakfast.      LORazepam (ATIVAN) 2 MG tablet Take 2 mg by mouth at bedtime as needed for anxiety.      losartan (COZAAR) 100 MG tablet Take 100 mg by mouth daily.      Magnesium Oxide 400 (240 Mg) MG TABS Take 1 tablet (400 mg total) by mouth daily. 30 tablet 3   metoprolol tartrate (LOPRESSOR) 50 MG tablet Take 50 mg by mouth in the morning and at bedtime.     nitroGLYCERIN (NITROSTAT) 0.4 MG SL tablet Place 1 tablet (0.4 mg total) under the tongue every 5 (five) minutes as needed for chest pain. 25 tablet 4   PRESCRIPTION MEDICATION CPAP: At bedtime and "as needed for shortness of breath"     promethazine (PHENADOZ) 25 MG suppository Place 25 mg rectally daily as needed (for headaches).      silver sulfADIAZINE (SILVADENE) 1 % cream Apply pea-sized amount to wound daily. 50 g 0   Tiotropium Bromide Monohydrate (SPIRIVA RESPIMAT) 2.5 MCG/ACT AERS Inhale 2 puffs into the lungs daily. 12 g 3   torsemide (DEMADEX) 20 MG tablet Take 1 tablet (20 mg total) by mouth 2 (two) times daily. 60 tablet 3   TRUE METRIX BLOOD GLUCOSE TEST test strip      vitamin B-12 (CYANOCOBALAMIN) 1000 MCG tablet Take 1,000 mcg by mouth daily.     No current facility-administered medications for this visit.    Allergies:   Dulaglutide   Social  History:  The patient  reports that she quit smoking about 22 years ago. Her smoking use included cigarettes. She started smoking about 55 years ago. She has a 66.00 pack-year smoking  history. She has never used smokeless tobacco. She reports that she does not drink alcohol and does not use drugs.   Family History:  The patient's family history includes Diabetes in her maternal aunt and maternal uncle; Heart Problems in her father and mother; Heart defect in her mother; Heart disease in her brother; Lung cancer in her daughter; Throat cancer in her brother.  ROS:  Please see the history of present illness.  All other systems are reviewed and otherwise negative.   PHYSICAL EXAM:  VS:  BP (!) 124/46    Pulse 76    Ht 5\' 6"  (1.676 m)    Wt (!) 239 lb (108.4 kg)    SpO2 91%    BMI 38.58 kg/m  BMI: Body mass index is 38.58 kg/m. Well nourished, well developed, in no acute distress  HEENT: normocephalic, atraumatic  Neck: no JVD, carotid bruits or masses Cardiac: RRR; no significant murmurs, no rubs, or gallops Lungs:  CTA b/l, no wheezing, rhonchi or rales  Abd: soft, nontender MS: no deformity, age appropriate atrophy Ext: trace edema b/l LE, orthopedic shoe L foor She has easily palpable 3+ radial and pedal pulses b/l with excellent and equal strength as well.  Hands and feet are warm, no skin changes She has a known wound L great toe that is bandaged she is seeing podiatry for and she reports 2/2 to her crooked toe Skin: warm and dry, no rash Neuro:  No gross deficits appreciated Psych: euthymic mood, full affect  PPM site is stable, no tethering or discomfort   EKG:  Done today and reviewed by myself shows  AP/VS 76bpm, manually measured QT 400-418ms/QTc 450-453ms  PPM interrogation done today and reviewed by myself: Battery and lead measurements are good She had PAFib on/off all day for 2 days July 22 and some July 20-21, largely rate controlled, longest 5 hours Available EGMs do note return of SR   08/13/2019: TTE IMPRESSIONS  1. Left ventricular ejection fraction, by estimation, is 65 to 70%. The  left ventricle has normal function. The left ventricle has  no regional  wall motion abnormalities. There is moderate concentric left ventricular  hypertrophy. Unable to assess LV  diastolic filling due to underlying atrial fibrillation.  2. Right ventricular systolic function is normal. The right ventricular  size is normal. There is normal pulmonary artery systolic pressure.  3. Right atrial size was moderately dilated.  4. The mitral valve is normal in structure. No evidence of mitral valve  regurgitation. No evidence of mitral stenosis.  5. The aortic valve is tricuspid. Aortic valve regurgitation is not  visualized. Mild aortic valve sclerosis is present, with no evidence of  aortic valve stenosis.  6. The inferior vena cava is dilated in size with >50% respiratory  variability, suggesting right atrial pressure of 8 mmHg.   12/31/2017: Coronary CT IMPRESSION: 1. Coronary calcium score of 1422. This was 57 percentile for age and sex matched control.  2. Normal coronary origin with right dominance.  3. Diffuse plaque with moderate disease in the RCA and LAD. Additional analysis with CT FFR will be submitted.  1. Left Main:  No significant stenosis. 2. LAD: No significant stenosis. 3. LCX: No significant stenosis. 4. OM1: No significant stenosis. 5. OM2: Distal OM2: CT FFR: 0.78. 6.  OM3: No significant stenosis. 7. RCA: No significant stenosis.  IMPRESSION: 1. CT FFR analysis showed stenosis in the distal portion of a small OM2 artery. Aggressive medical management is recommended.   Recent Labs: 12/09/2018: Pro B Natriuretic peptide (BNP) 121.0 08/16/2019: ALT 16 10/03/2019: B Natriuretic Peptide 69.3 10/17/2019: Hemoglobin 9.6; Platelets 504; TSH 2.640 11/10/2019: BUN 54; Creatinine, Ser 1.46; Magnesium 1.9; Potassium 4.9; Sodium 138  No results found for requested labs within last 8760 hours.   Estimated Creatinine Clearance: 39.6 mL/min (A) (by C-G formula based on SCr of 1.46 mg/dL (H)).   Wt Readings from Last 3  Encounters:  11/24/19 (!) 239 lb (108.4 kg)  11/13/19 237 lb 9.6 oz (107.8 kg)  11/10/19 237 lb (107.5 kg)     Other studies reviewed: Additional studies/records reviewed today include: summarized above  ASSESSMENT AND PLAN:  1. Paroxysmal Afib     CHA2DS2Vasc is 6, on Eliquis,  appropriately dosed     Tikosyn, QT stable     Med list is reviewed with PharmD, no contraindicated medicines     Labs today      She had some PAFib for the last 2 days. Will follow burden and have her back in a couple months  2. PPM     Intact function, no programming changes made  3. Chronic CHF ( diastolic)     Not overtly volume OL, she has trace edema, lungs are clear, breathing at her baseline     No changes  4. HTN     She reports of late  SBP 140's-150's DBP high 40's-50's     Follow  5. CAD     Coronary CT as noted above     She has had some CP, somewhat atypical     Known branch vessel disease     No exertional CP, she is inclined to hold off on any changes or evaluations     She is instructed if any escalation/changes to let us know  6. b./l hand feet numbness     No exam findings to suggest any significant vascular disease     She will follow up with her PMD   Disposition: F/u with Dr. Caryl Comes in 60mo revisit AFib burden and her Tikosyn/AAD effectiveness   Current medicines are reviewed at length with the patient today.  The patient did not have any concerns regarding medicines.  Venetia Night, PA-C 11/24/2019 12:58 PM     [address] [place] [address] [phone] (office)  [phone] (fax)

## 2019-11-24 NOTE — Progress Notes (Signed)
  Subjective:  Patient ID: Kayla [name], female    DOB: 1941-03-16,  MRN: [id number]  Chief Complaint  Patient presents with  . Nail Problem    the left big toenail has come off and a new one is growing under it   . Foot Ulcer    the spot on the big toe left is still there and seems to be not wanting to heal   79 y.o. female presents for wound care. Hx confirmed with patient.  Objective:  Physical Exam: Wound Location: Left hallux Wound Measurement: 2x1 Wound Base: Mixed Granular/Fibrotic Peri-wound: Calloused Exudate: Scant/small amount Serosanguinous exudate No erythema presents  No images are attached to the encounter. Assessment:   1. Skin ulcer of left great toe with fat layer exposed ([place])    Plan:  Patient was evaluated and treated and all questions answered.  Ulcer left hallux -Dressing applied with iodosorb WTD. -Offload ulcer with surgical shoe -Wound cleansed and debrided -Ultimately may need surgical correction however we will have to evaluate if she is a good sx candidate. -Pending MRI next week  Procedure: Selective Debridement of Wound Rationale: Removal of devitalized tissue from the wound to promote healing.  Pre-Debridement Wound Measurements: 2 cm x 1 cm x 0.2 cm  Post-Debridement Wound Measurements: same as pre-debridement. Type of Debridement: sharp selective Tissue Removed: Devitalized soft-tissue Dressing: Dry, sterile, compression dressing. Disposition: Patient tolerated procedure well. Patient to return in 1 week for follow-up.   Hallux Valgus -Pending possible surgery  No follow-ups on file.

## 2019-11-24 NOTE — Patient Instructions (Signed)
Medication Instructions:   Your physician recommends that you continue on your current medications as directed. Please refer to the Current Medication list given to you today.  *If you need a refill on your cardiac medications before your next appointment, please call your pharmacy*   Lab Work: BMET  AND [place]   If you have labs (blood work) drawn today and your tests are completely normal, you will receive your results only by: Marland Kitchen MyChart Message (if you have MyChart) OR . A paper copy in the mail If you have any lab test that is abnormal or we need to change your treatment, we will call you to review the results.   Testing/Procedures: NONE ORDERED  TODAY   Follow-Up: At [facility], you and your health needs are our priority.  As part of our continuing mission to provide you with exceptional heart care, we have created designated Provider Care Teams.  These Care Teams include your primary Cardiologist (physician) and Advanced Practice Providers (APPs -  Physician Assistants and Nurse Practitioners) who all work together to provide you with the care you need, when you need it.  We recommend signing up for the patient portal called "MyChart".  Sign up information is provided on this After Visit Summary.  MyChart is used to connect with patients for Virtual Visits (Telemedicine).  Patients are able to view lab/test results, encounter notes, upcoming appointments, etc.  Non-urgent messages can be sent to your provider as well.   To learn more about what you can do with MyChart, go to [email or web address].    Your next appointment:   2 month(s)  The format for your next appointment:   In Person  Provider:   You may see Dr. Caryl Comes  or one of the following Advanced Practice Providers on your designated Care Team:    Chanetta Marshall, NP  Tommye Standard, PA-C  Legrand Como "[name], [place]    Other Instructions

## 2019-11-25 LAB — BASIC METABOLIC PANEL
BUN/Creatinine Ratio: 34 — ABNORMAL HIGH (ref 12–28)
BUN: 47 mg/dL — ABNORMAL HIGH (ref 8–27)
CO2: 21 mmol/L (ref 20–29)
Calcium: 9.1 mg/dL (ref 8.7–10.3)
Chloride: 104 mmol/L (ref 96–106)
Creatinine, Ser: 1.4 mg/dL — ABNORMAL HIGH (ref 0.57–1.00)
GFR calc Af Amer: 42 mL/min/{1.73_m2} — ABNORMAL LOW (ref >59)
GFR calc non Af Amer: 36 mL/min/{1.73_m2} — ABNORMAL LOW (ref >59)
Glucose: 212 mg/dL — ABNORMAL HIGH (ref 65–99)
Potassium: 4.5 mmol/L (ref 3.5–5.2)
Sodium: 140 mmol/L (ref 134–144)

## 2019-11-25 LAB — MAGNESIUM: Magnesium: 1.5 mg/dL — ABNORMAL LOW (ref 1.6–2.3)

## 2019-11-27 ENCOUNTER — Other Ambulatory Visit: Payer: Self-pay

## 2019-11-27 NOTE — Patient Outreach (Signed)
[facility]) Care Management  11/27/2019  Kayla [name] 06/19/1940 [id number]    EMMI-General Discharge RED ON EMMI ALERT Day #4 Date:11/07/2019 Red Alert Reason:"Lost interest in things? Yes"     Multiple attempts to establish contact with patient without success. No response from letter mailed to patient. Case is being closed at this time.    Plan: RN CM will close case at this time.   Enzo [place], RN,BSN,CCM [facility] Telephonic Care Management Coordinator Direct Phone: [phone] Toll Free: [phone] Fax: [phone]

## 2019-11-28 ENCOUNTER — Telehealth: Payer: Self-pay

## 2019-11-28 NOTE — Telephone Encounter (Signed)
Left voicemail for pt to contact RN at [phone].

## 2019-11-28 NOTE — Telephone Encounter (Signed)
-----   Message from [facility], [place] sent at 11/27/2019 10:00 AM EDT ----- Please make sure she is taking her mar ox. If she is, increase it to 400mg  TID today and tomorrow, then reduced to BID and recheck Thursday please.  Otherwise labs look OK for her Tikosyn.

## 2019-11-29 ENCOUNTER — Encounter: Payer: Medicare HMO | Admitting: Physical Therapy

## 2019-11-29 ENCOUNTER — Telehealth: Payer: Self-pay | Admitting: Physician Assistant

## 2019-11-29 ENCOUNTER — Ambulatory Visit (HOSPITAL_COMMUNITY)
Admission: RE | Admit: 2019-11-29 | Discharge: 2019-11-29 | Disposition: A | Discharge status: Home / Self Care | Payer: Medicare HMO | Source: Ambulatory Visit | Attending: Podiatry | Admitting: Podiatry

## 2019-11-29 ENCOUNTER — Other Ambulatory Visit: Payer: Self-pay

## 2019-11-29 DIAGNOSIS — M868X7 Other osteomyelitis, ankle and foot: Secondary | ICD-10-CM | POA: Insufficient documentation

## 2019-11-29 DIAGNOSIS — E11621 Type 2 diabetes mellitus with foot ulcer: Secondary | ICD-10-CM | POA: Diagnosis not present

## 2019-11-29 MED ORDER — GADOBUTROL 1 MMOL/ML IV SOLN
10.0000 mL | Freq: Once | INTRAVENOUS | Status: AC | PRN
  Administered 2019-11-29: 10 mL via INTRAVENOUS

## 2019-11-29 NOTE — Telephone Encounter (Signed)
The patient was at [facility] for an out patient foot MRI I spoke to her after her MRI She reported taking her magnesium, I asked her to increase her magnesium 400mg  daily to twice daily today and our office would call to schedule follow up lab draw. She stated understanding (had no pre-med for her MRI), and husband was with her as well. She was asked to call with any questions  To note, pre-MRI she was AP/VS post she was in a rate controlled Aflutter. I will have her scheduled for a remote in a week as well to revisit her Afib burden and may need to revisit her AAD therapy. She is aware  Tommye Standard, PA-C

## 2019-11-29 NOTE — Progress Notes (Signed)
Changed device settings for MRI to  DOO at 95bpm  Will Program device back to pre-MRI settings after completion of exam, [employer] rep to check device post scan per APP request

## 2019-11-30 MED ORDER — MAGNESIUM OXIDE -MG SUPPLEMENT 400 (240 MG) MG PO TABS: ORAL_TABLET | ORAL | 3 refills | Status: DC

## 2019-11-30 NOTE — Telephone Encounter (Signed)
Patient states she is about to leave her house and she would prefer a call back on her cell phone at [phone].

## 2019-11-30 NOTE — Telephone Encounter (Signed)
Spoke with pt and advised per Kayla [name] Standard to increase Mag Ox 400mg  to 1 tablet by mouth twice daily.  Rx sent to [facility] per pt request.  Appointment scheduled for lab to recheck [place] level on 12/04/2019.  Pt may walk in 8am-430-pm.  Pt verbalizes understanding and agrees with current plan.

## 2019-11-30 NOTE — Telephone Encounter (Signed)
Patient is returning call.  °

## 2019-12-01 ENCOUNTER — Telehealth: Payer: Self-pay | Admitting: Cardiology

## 2019-12-01 ENCOUNTER — Other Ambulatory Visit: Payer: Self-pay

## 2019-12-01 ENCOUNTER — Ambulatory Visit: Payer: Self-pay

## 2019-12-01 ENCOUNTER — Telehealth: Payer: Self-pay

## 2019-12-01 ENCOUNTER — Ambulatory Visit: Payer: Medicare HMO | Admitting: Podiatry

## 2019-12-01 DIAGNOSIS — E1142 Type 2 diabetes mellitus with diabetic polyneuropathy: Secondary | ICD-10-CM | POA: Diagnosis not present

## 2019-12-01 DIAGNOSIS — I480 Paroxysmal atrial fibrillation: Secondary | ICD-10-CM | POA: Diagnosis not present

## 2019-12-01 DIAGNOSIS — M2012 Hallux valgus (acquired), left foot: Secondary | ICD-10-CM | POA: Diagnosis not present

## 2019-12-01 DIAGNOSIS — M86672 Other chronic osteomyelitis, left ankle and foot: Secondary | ICD-10-CM

## 2019-12-01 DIAGNOSIS — I5033 Acute on chronic diastolic (congestive) heart failure: Secondary | ICD-10-CM

## 2019-12-01 DIAGNOSIS — L97529 Non-pressure chronic ulcer of other part of left foot with unspecified severity: Secondary | ICD-10-CM | POA: Diagnosis not present

## 2019-12-01 DIAGNOSIS — E11621 Type 2 diabetes mellitus with foot ulcer: Secondary | ICD-10-CM

## 2019-12-01 DIAGNOSIS — L97522 Non-pressure chronic ulcer of other part of left foot with fat layer exposed: Secondary | ICD-10-CM

## 2019-12-01 MED ORDER — DOXYCYCLINE HYCLATE 100 MG PO TABS
100.0000 mg | ORAL_TABLET | Freq: Two times a day (BID) | ORAL | 0 refills | Status: DC
Start: 2019-12-01 — End: 2019-12-28

## 2019-12-01 NOTE — H&P (View-Only) (Signed)
  Subjective:  Patient ID: Kayla [name], female    DOB: 1940-06-22,  MRN: [id number]  Chief Complaint  Patient presents with  . Foot Ulcer    MRI results review. Pt states "I still have pain and swelling".    79 y.o. female presents for wound care. Hx confirmed with patient.  Objective:  Physical Exam: Wound Location: Left hallux Wound Measurement: 2x1 Wound Base: Mixed Granular/Fibrotic Peri-wound: Calloused Exudate: Scant/small amount Serosanguinous exudate Erythema to the hallux IPJ.  Hallux valgus deformity, hammertoe deformity.  No images are attached to the encounter. Assessment:   1. Skin ulcer of left great toe with fat layer exposed ([place])   2. Hallux valgus, left   3. Chronic osteomyelitis involving left ankle and foot ([place])   4. Diabetic ulcer of left great toe ([place])   5. Diabetic peripheral neuropathy associated with type 2 diabetes mellitus ([facility])   6. Paroxysmal atrial fibrillation ([place])   7. Acute on chronic diastolic congestive heart failure ([place])    Plan:  Patient was evaluated and treated and all questions answered.  Ulcer left hallux -Reviewed MRI with patient s/o OM. -Discussed treatment options with patient. Discussed amputation vs bone biopsy with abx therapy vs HBOT. Patient elects for amputation for highest rate of success with lowest rate of complications to other organs.  -Consent forms reviewed and signed. -Patient has failed all conservative therapy and wishes to proceed with surgical intervention. All risks, benefits, and alternatives discussed with patient. No guarantees given. Consent reviewed and signed by patient. -Planned procedures: left great toe amputation (partial) -Risk factors: DM, DPN, CHF, Afib -Will need PCP and likely Cardiology clearance for surgery. -ABx refilled for suppression.  Surgery tentatively Aug 13th   No follow-ups on file.

## 2019-12-01 NOTE — Telephone Encounter (Signed)
   [facility] Pre-operative Risk Assessment    HEARTCARE STAFF: - Please ensure there is not already an duplicate clearance open for this procedure. - Under Visit Info/Reason for Call, type in Other and utilize the format Clearance MM/DD/YY or Clearance TBD. Do not use dashes or single digits. - If request is for dental extraction, please clarify the # of teeth to be extracted.  Request for surgical clearance:  1. What type of surgery is being performed? Left Great Toe Amputatiion   2. When is this surgery scheduled? 12-15-19   3. What type of clearance is required (medical clearance vs. Pharmacy clearance to hold med vs. Both)? Both  4. Are there any medications that need to be held prior to surgery and how long?  5. Practice name and name of physician performing surgery? Dr Hardie Pulley   6. What is the office phone number?  [phone]   7.   What is the office fax number? [phone]  8.   Anesthesia type (None, local, MAC, general) ? Mac   Kayla [name] 12/01/2019, 11:24 AM  _________________________________________________________________   (provider comments below)

## 2019-12-01 NOTE — Telephone Encounter (Signed)
DOS 12/15/2019  AMPUTATION TOE IPJ 1ST LT - 28825  PER [facility] [employer]  The following codes do not require a pre-authorization Created on 12/01/2019 Sex F DOB 1940-10-22 Payer n/a Membership type n/a Plan Type MED Plan Year 05/05/2019 - 05/03/9998 Group ID [id number] Service info [id number] Amputation of foot

## 2019-12-01 NOTE — Telephone Encounter (Signed)
Patient with diagnosis of afib on Eliquis for anticoagulation.    Procedure:  Left Great Toe Amputatiion  Date of procedure: 12/15/19  CHADS2-VASc score of  7 (CHF, HTN, AGE, DM2, CAD, AGE, female)  CrCl 41 ml/min  Due to patients CHADS-VASc score of 7, she is high risk off of anticoagulation. I will defer length of hold to Dr. Marlou Porch

## 2019-12-01 NOTE — Progress Notes (Signed)
  Subjective:  Patient ID: Kayla [name], female    DOB: [birth year],  MRN: [id number]  Chief Complaint  Patient presents with  . Foot Ulcer    MRI results review. Pt states "I still have pain and swelling".    79 y.o. female presents for wound care. Hx confirmed with patient.  Objective:  Physical Exam: Wound Location: Left hallux Wound Measurement: 2x1 Wound Base: Mixed Granular/Fibrotic Peri-wound: Calloused Exudate: Scant/small amount Serosanguinous exudate Erythema to the hallux IPJ.  Hallux valgus deformity, hammertoe deformity.  No images are attached to the encounter. Assessment:   1. Skin ulcer of left great toe with fat layer exposed ([place])   2. Hallux valgus, left   3. Chronic osteomyelitis involving left ankle and foot ([place])   4. Diabetic ulcer of left great toe ([place])   5. Diabetic peripheral neuropathy associated with type 2 diabetes mellitus ([facility])   6. Paroxysmal atrial fibrillation ([place])   7. Acute on chronic diastolic congestive heart failure ([place])    Plan:  Patient was evaluated and treated and all questions answered.  Ulcer left hallux -Reviewed MRI with patient s/o OM. -Discussed treatment options with patient. Discussed amputation vs bone biopsy with abx therapy vs HBOT. Patient elects for amputation for highest rate of success with lowest rate of complications to other organs.  -Consent forms reviewed and signed. -Patient has failed all conservative therapy and wishes to proceed with surgical intervention. All risks, benefits, and alternatives discussed with patient. No guarantees given. Consent reviewed and signed by patient. -Planned procedures: left great toe amputation (partial) -Risk factors: DM, DPN, CHF, Afib -Will need PCP and likely Cardiology clearance for surgery. -ABx refilled for suppression.  Surgery tentatively Aug 13th   No follow-ups on file.

## 2019-12-02 NOTE — Telephone Encounter (Signed)
Hold Eliquis for 3 days prior to toe amputation. May be getting a nerve block.  She remains high risk off of anticoagulation.  Candee Furbish, MD

## 2019-12-04 ENCOUNTER — Other Ambulatory Visit: Payer: Self-pay

## 2019-12-04 ENCOUNTER — Telehealth: Payer: Self-pay

## 2019-12-04 ENCOUNTER — Other Ambulatory Visit: Payer: Medicare HMO

## 2019-12-04 DIAGNOSIS — I2510 Atherosclerotic heart disease of native coronary artery without angina pectoris: Secondary | ICD-10-CM | POA: Diagnosis not present

## 2019-12-04 DIAGNOSIS — E1149 Type 2 diabetes mellitus with other diabetic neurological complication: Secondary | ICD-10-CM | POA: Diagnosis not present

## 2019-12-04 DIAGNOSIS — I480 Paroxysmal atrial fibrillation: Secondary | ICD-10-CM | POA: Diagnosis not present

## 2019-12-04 DIAGNOSIS — M545 Low back pain: Secondary | ICD-10-CM | POA: Diagnosis not present

## 2019-12-04 DIAGNOSIS — I10 Essential (primary) hypertension: Secondary | ICD-10-CM | POA: Diagnosis not present

## 2019-12-04 DIAGNOSIS — I5030 Unspecified diastolic (congestive) heart failure: Secondary | ICD-10-CM | POA: Diagnosis not present

## 2019-12-04 DIAGNOSIS — M869 Osteomyelitis, unspecified: Secondary | ICD-10-CM | POA: Diagnosis not present

## 2019-12-04 DIAGNOSIS — G8929 Other chronic pain: Secondary | ICD-10-CM | POA: Diagnosis not present

## 2019-12-04 DIAGNOSIS — E1165 Type 2 diabetes mellitus with hyperglycemia: Secondary | ICD-10-CM | POA: Diagnosis not present

## 2019-12-04 MED ORDER — METOPROLOL SUCCINATE ER 50 MG PO TB24
50.0000 mg | ORAL_TABLET | Freq: Every day | ORAL | 1 refills | Status: DC
Start: 2019-12-04 — End: 2019-12-06

## 2019-12-04 MED ORDER — METOPROLOL SUCCINATE ER 50 MG PO TB24
50.0000 mg | ORAL_TABLET | Freq: Every day | ORAL | 3 refills | Status: DC
Start: 2019-12-04 — End: 2019-12-27

## 2019-12-04 NOTE — Telephone Encounter (Signed)
Patient came in for lab work today and requested that someone call her in regards to her A Fib.  Spoke with the patient who states that she is having worsening SOB with exertion. She denies any chest pain or dizziness. States that she does have swelling in her feet  She states that she can also feel her heart racing. Does not have any BP or HR readings. She was started on Tikosyn which she states felt to be working at first, however the past week she reports that it is not working. Having frequent palpitations.   Patient also inquiring if it is okay for her to have surgery. Advised that clearance was sent over to her surgeon.

## 2019-12-04 NOTE — Telephone Encounter (Signed)
Contacted patient to let her know Tilda Franco has reviewed her transmission. Her heart rates have mostly been in the 60s and she has had a few higher heart rates in between. She was told to discontinue the Metoprolol Tartrate medication. He would like to switch her to Metoprolol Succinate ER- 50mg  once daily.This should help her have better control over her heart rates and to last all day. I [facility] in [facility] to [facility] [place] and her mail in order pharmacy [facility] with a 90 day supply. Consulted with patient and she verbalized understanding.

## 2019-12-04 NOTE — Telephone Encounter (Signed)
Patient has infected toe that is planning to be amputated in 2 weeks by Dr. March Rummage. Pt is on her 3rd round of antibiotics. Informed pt infection can promote AF. She is going to send transmission so we can see AF burden and rates to ensure they are controlled for upcoming surgery. Pt states swelling in feet goes away overnight but returns as the day goes on. She is not currently weighing herself but will do this today. I will call pt back after receive transmission results.

## 2019-12-04 NOTE — Telephone Encounter (Signed)
   Primary Cardiologist: Candee Furbish, MD  Chart reviewed as part of pre-operative protocol coverage. Given past medical history and time since last visit, based on ACC/AHA guidelines, ALEXCIS BICKING would be at acceptable risk for the planned procedure without further cardiovascular testing.   Per his primary cardiologist, Dr. Marlou Porch, she may hold her Eliquis 3 days prior to her procedure. In the setting of atrial fibrillation with CHADS2VASC score of 7 (CHF, HTN, AGEx2, female, DM2, CAD). Due to her elevated CHADS2VASc score though she remains high risk off anticoagulation, she may proceed as planned.  I will route this recommendation to the requesting party via Epic fax function and remove from pre-op pool.  Please call with questions.  Loel Dubonnet, NP 12/04/2019, 11:45 AM

## 2019-12-04 NOTE — Telephone Encounter (Signed)
Transmission reviewed. 53.5% AT/AF burden since 11/29/19, longest 11 hrs. V rates controlled. Presenting rhythm: A-flutter/VP 80's. Sent to [place], Therapist, sports, as requested.

## 2019-12-04 NOTE — Telephone Encounter (Signed)
Left message for Ms. Ybarra in reference to cardiac clearance for her surgery on 12/15/19. Per Dr. Marlou Porch she can hold her Eliquis 3 days prior to her surgery.

## 2019-12-04 NOTE — Telephone Encounter (Signed)
Transmission received. She asked was someone going to give her a call back. I told her yes someone will give her a call.

## 2019-12-05 ENCOUNTER — Other Ambulatory Visit: Payer: Self-pay | Admitting: *Deleted

## 2019-12-05 ENCOUNTER — Encounter: Payer: Medicare HMO | Admitting: Physical Therapy

## 2019-12-05 ENCOUNTER — Telehealth: Payer: Self-pay

## 2019-12-05 DIAGNOSIS — Z79899 Other long term (current) drug therapy: Secondary | ICD-10-CM

## 2019-12-05 LAB — MAGNESIUM: Magnesium: 1.7 mg/dL (ref 1.6–2.3)

## 2019-12-05 MED ORDER — MAGNESIUM OXIDE -MG SUPPLEMENT 400 (240 MG) MG PO TABS: 400.0000 mg | ORAL_TABLET | Freq: Three times a day (TID) | ORAL | 3 refills | Status: DC

## 2019-12-05 NOTE — Telephone Encounter (Signed)
Mr. Siemen returned my call from yesterday. I explained to her that per Dr. Marlou Porch she is to stop Eliquis 3 days prior to her foot surgery on 12/15/2019. She stated she understood this.

## 2019-12-06 ENCOUNTER — Other Ambulatory Visit: Payer: Self-pay

## 2019-12-08 ENCOUNTER — Encounter: Payer: Medicare HMO | Admitting: Physical Therapy

## 2019-12-11 ENCOUNTER — Encounter: Payer: Self-pay | Admitting: Internal Medicine

## 2019-12-11 ENCOUNTER — Encounter: Payer: Medicare HMO | Admitting: Physical Therapy

## 2019-12-11 NOTE — Patient Instructions (Addendum)
DUE TO COVID-19 ONLY ONE VISITOR IS ALLOWED TO COME WITH YOU AND STAY IN THE WAITING ROOM ONLY DURING PRE OP AND PROCEDURE DAY OF SURGERY. THE 1 VISITOR  MAY VISIT WITH YOU AFTER SURGERY IN YOUR PRIVATE ROOM DURING VISITING HOURS ONLY!  YOU NEED TO HAVE A COVID 19 TEST ON: 11:50 am @ 11:50 am , THIS TEST MUST BE DONE BEFORE SURGERY,  COVID TESTING SITE [place] [address], IT IS ON THE RIGHT GOING OUT [address] APPROXIMATELY  2 MINUTES PAST [employer] ON THE RIGHT. ONCE YOUR COVID TEST IS COMPLETED,  PLEASE BEGIN THE QUARANTINE INSTRUCTIONS AS OUTLINED IN YOUR HANDOUT.                Kayla [name]    Your procedure is scheduled on: 12/15/19   Report to [facility] Main  Entrance   Report to admitting at: 12:15 PM     Call this number if you have problems the morning of surgery [phone]    Remember:   NO SOLID FOOD AFTER MIDNIGHT THE NIGHT PRIOR TO SURGERY. NOTHING BY MOUTH EXCEPT CLEAR LIQUIDS UNTIL: 11:15 am . PLEASE FINISH GATORADE DRINK PER SURGEON ORDER  WHICH NEEDS TO BE COMPLETED AT: 11:15 am .   CLEAR LIQUID DIET   Foods Allowed                                                                     Foods Excluded  Coffee and tea, regular and decaf                             liquids that you cannot  Plain Jell-O any favor except red or purple                                           see through such as: Fruit ices (not with fruit pulp)                                     milk, soups, orange juice  Iced Popsicles                                    All solid food Carbonated beverages, regular and diet                                    Cranberry, grape and apple juices Sports drinks like Gatorade Lightly seasoned clear broth or consume(fat free) Sugar, honey syrup  Sample Menu Breakfast                                Lunch  Supper Cranberry juice                    Beef broth                             Chicken broth Jell-O                                     Grape juice                           Apple juice Coffee or tea                        Jell-O                                      Popsicle                                                Coffee or tea                        Coffee or tea  _____________________________________________________________________  BRUSH YOUR TEETH MORNING OF SURGERY AND RINSE YOUR MOUTH OUT, NO CHEWING GUM CANDY OR MINTS.   Take these medicines the morning of surgery with A SIP OF WATER: amlodipine,doxycycline,metoprolol,allopurinol,gabapentin,levothyroxine,tikosyn(dofetilide). Use inhalers and nasal spray as usual.   How to Manage Your Diabetes Before and After Surgery  Why is it important to control my blood sugar before and after surgery? . Improving blood sugar levels before and after surgery helps healing and can limit problems. . A way of improving blood sugar control is eating a healthy diet by: o  Eating less sugar and carbohydrates o  Increasing activity/exercise o  Talking with your doctor about reaching your blood sugar goals . High blood sugars (greater than 180 mg/dL) can raise your risk of infections and slow your recovery, so you will need to focus on controlling your diabetes during the weeks before surgery. . Make sure that the doctor who takes care of your diabetes knows about your planned surgery including the date and location.  How do I manage my blood sugar before surgery? . Check your blood sugar at least 4 times a day, starting 2 days before surgery, to make sure that the level is not too high or low. o Check your blood sugar the morning of your surgery when you wake up and every 2 hours until you get to the Short Stay unit. . If your blood sugar is less than 70 mg/dL, you will need to treat for low blood sugar: o Do not take insulin. o Treat a low blood sugar (less than 70 mg/dL) with  cup of clear juice  (cranberry or apple), 4 glucose tablets, OR glucose gel. o Recheck blood sugar in 15 minutes after treatment (to make sure it is greater than 70 mg/dL). If your blood sugar is not greater than 70 mg/dL on recheck, call [phone] for further instructions. . Report your blood sugar to the short stay nurse when you get to Short  Stay.  . If you are admitted to the hospital after surgery: o Your blood sugar will be checked by the staff and you will probably be given insulin after surgery (instead of oral diabetes medicines) to make sure you have good blood sugar levels. o The goal for blood sugar control after surgery is 80-180 mg/dL.   WHAT DO I DO ABOUT MY DIABETES MEDICATION?  Marland Kitchen Do not take oral diabetes medicines (pills) the morning of surgery.  . THE DAY BEFORE SURGERY, take Novolog insulin as usual for breakfast and lunch. DO NOT take dinner dose.  . Take usual dose of Lantus insulin in the morning.Take only half of the dose at night time.    . THE MORNING OF SURGERY, take half of Lantus insulin dose.  . If your CBG is greater than 220 mg/dL, you may take  of your sliding scale  . (correction) dose of insulin(Novolog).                               You may not have any metal on your body including hair pins and              piercings  Do not wear jewelry, make-up, lotions, powders or perfumes, deodorant             Do not wear nail polish on your fingernails.  Do not shave  48 hours prior to surgery.       Do not bring valuables to the hospital. [place].  Contacts, dentures or bridgework may not be worn into surgery.  Leave suitcase in the car. After surgery it may be brought to your room.     Patients discharged the day of surgery will not be allowed to drive home. IF YOU ARE HAVING SURGERY AND GOING HOME THE SAME DAY, YOU MUST HAVE AN ADULT TO DRIVE YOU HOME AND BE WITH YOU FOR 24 HOURS. YOU MAY GO HOME BY TAXI OR UBER OR  ORTHERWISE, BUT AN ADULT MUST ACCOMPANY YOU HOME AND STAY WITH YOU FOR 24 HOURS.  Name and phone number of your driver:  Special Instructions: N/A              Please read over the following fact sheets you were given: _____________________________________________________________________          PLEASE BRING CPAP MASK [place]. DEVICE WILL BE PROVIDED!  [place] - Preparing for Surgery Before surgery, you can play an important role.  Because skin is not sterile, your skin needs to be as free of germs as possible.  You can reduce the number of germs on your skin by washing with CHG (chlorahexidine gluconate) soap before surgery.  CHG is an antiseptic cleaner which kills germs and bonds with the skin to continue killing germs even after washing. Please DO NOT use if you have an allergy to CHG or antibacterial soaps.  If your skin becomes reddened/irritated stop using the CHG and inform your nurse when you arrive at Short Stay. Do not shave (including legs and underarms) for at least 48 hours prior to the first CHG shower.  You may shave your face/neck. Please follow these instructions carefully:  1.  Shower with CHG Soap the night before surgery and the  morning of Surgery.  2.  If you choose  to wash your hair, wash your hair first as usual with your  normal  shampoo.  3.  After you shampoo, rinse your hair and body thoroughly to remove the  shampoo.                           4.  Use CHG as you would any other liquid soap.  You can apply chg directly  to the skin and wash                       Gently with a scrungie or clean washcloth.  5.  Apply the CHG Soap to your body ONLY FROM THE NECK DOWN.   Do not use on face/ open                           Wound or open sores. Avoid contact with eyes, ears mouth and genitals (private parts).                       Wash face,  Genitals (private parts) with your normal soap.             6.  Wash thoroughly, paying special attention to the area  where your surgery  will be performed.  7.  Thoroughly rinse your body with warm water from the neck down.  8.  DO NOT shower/wash with your normal soap after using and rinsing off  the CHG Soap.                9.  Pat yourself dry with a clean towel.            10.  Wear clean pajamas.            11.  Place clean sheets on your bed the night of your first shower and do not  sleep with pets. Day of Surgery : Do not apply any lotions/deodorants the morning of surgery.  Please wear clean clothes to the hospital/surgery center.  FAILURE TO FOLLOW THESE INSTRUCTIONS MAY RESULT IN THE CANCELLATION OF YOUR SURGERY PATIENT SIGNATURE_________________________________  NURSE SIGNATURE__________________________________  ________________________________________________________________________   Kayla [name]  An incentive spirometer is a tool that can help keep your lungs clear and active. This tool measures how well you are filling your lungs with each breath. Taking long deep breaths may help reverse or decrease the chance of developing breathing (pulmonary) problems (especially infection) following:  A long period of time when you are unable to move or be active. BEFORE THE PROCEDURE   If the spirometer includes an indicator to show your best effort, your nurse or respiratory therapist will set it to a desired goal.  If possible, sit up straight or lean slightly forward. Try not to slouch.  Hold the incentive spirometer in an upright position. INSTRUCTIONS FOR USE  1. Sit on the edge of your bed if possible, or sit up as far as you can in bed or on a chair. 2. Hold the incentive spirometer in an upright position. 3. Breathe out normally. 4. Place the mouthpiece in your mouth and seal your lips tightly around it. 5. Breathe in slowly and as deeply as possible, raising the piston or the ball toward the top of the column. 6. Hold your breath for 3-5 seconds or for as long as possible.  Allow the piston or ball to fall to  the bottom of the column. 7. Remove the mouthpiece from your mouth and breathe out normally. 8. Rest for a few seconds and repeat Steps 1 through 7 at least 10 times every 1-2 hours when you are awake. Take your time and take a few normal breaths between deep breaths. 9. The spirometer may include an indicator to show your best effort. Use the indicator as a goal to work toward during each repetition. 10. After each set of 10 deep breaths, practice coughing to be sure your lungs are clear. If you have an incision (the cut made at the time of surgery), support your incision when coughing by placing a pillow or rolled up towels firmly against it. Once you are able to get out of bed, walk around indoors and cough well. You may stop using the incentive spirometer when instructed by your caregiver.  RISKS AND COMPLICATIONS  Take your time so you do not get dizzy or light-headed.  If you are in pain, you may need to take or ask for pain medication before doing incentive spirometry. It is harder to take a deep breath if you are having pain. AFTER USE  Rest and breathe slowly and easily.  It can be helpful to keep track of a log of your progress. Your caregiver can provide you with a simple table to help with this. If you are using the spirometer at home, follow these instructions: [place] IF:   You are having difficultly using the spirometer.  You have trouble using the spirometer as often as instructed.  Your pain medication is not giving enough relief while using the spirometer.  You develop fever of 100.5 F (38.1 C) or higher. SEEK IMMEDIATE MEDICAL CARE IF:   You cough up bloody sputum that had not been present before.  You develop fever of 102 F (38.9 C) or greater.  You develop worsening pain at or near the incision site. MAKE SURE YOU:   Understand these instructions.  Will watch your condition.  Will get help right away if you  are not doing well or get worse. Document Released: 08/31/2006 Document Revised: 07/13/2011 Document Reviewed: 11/01/2006 [facility] Patient Information 2014 [place], [place].   ________________________________________________________________________

## 2019-12-11 NOTE — Progress Notes (Unsigned)
PERIOPERATIVE PRESCRIPTION FOR IMPLANTED CARDIAC DEVICE PROGRAMMING  Patient Information: Name:  GIULLIANA MCROBERTS  DOB:  [birth year]  MRN:  [id number]  {TIP - You do not have to delete this tip  -  Copy the info from the staff message sent by the PAT staff  then press F2 here and paste the information using CTL - V on the next line :[id number]}  Planned Procedure: Amputation of left great toe  Surgeon: Dr. Hardie Pulley  Date of Procedure: 12/15/19  Cautery will be used.  Position during surgery:    Please send documentation back to:  [facility] (Fax # [phone])   Joline Maxcy, RN  12/11/2019 3:20 PM   Device Information:  Clinic EP Physician:  Cristopher Peru, MD   Device Type:  Pacemaker Manufacturer and Phone #:  [employer]: [id number] Pacemaker Dependent?:  No. Date of Last Device Check:  10/31/19 Normal Device Function?:  Yes.    Electrophysiologist's Recommendations:   Have magnet available.  Provide continuous ECG monitoring when magnet is used or reprogramming is to be performed.   Procedure should not interfere with device function.  No device programming or magnet placement needed.  Per Device Clinic Standing Orders, York Ram, RN  4:44 PM 12/11/2019

## 2019-12-12 ENCOUNTER — Encounter (HOSPITAL_COMMUNITY)
Admission: RE | Admit: 2019-12-12 | Discharge: 2019-12-12 | Disposition: A | Discharge status: Home / Self Care | Payer: Medicare HMO | Source: Ambulatory Visit | Attending: Podiatry | Admitting: Podiatry

## 2019-12-12 ENCOUNTER — Other Ambulatory Visit (HOSPITAL_COMMUNITY)
Admission: RE | Admit: 2019-12-12 | Discharge: 2019-12-12 | Disposition: A | Discharge status: Home / Self Care | Payer: Medicare HMO | Source: Ambulatory Visit | Attending: Podiatry | Admitting: Podiatry

## 2019-12-12 ENCOUNTER — Other Ambulatory Visit: Payer: Self-pay

## 2019-12-12 ENCOUNTER — Other Ambulatory Visit: Payer: Medicare HMO

## 2019-12-12 ENCOUNTER — Encounter (HOSPITAL_COMMUNITY): Payer: Self-pay

## 2019-12-12 DIAGNOSIS — Z01812 Encounter for preprocedural laboratory examination: Secondary | ICD-10-CM | POA: Diagnosis not present

## 2019-12-12 DIAGNOSIS — Z79899 Other long term (current) drug therapy: Secondary | ICD-10-CM

## 2019-12-12 DIAGNOSIS — Z20822 Contact with and (suspected) exposure to covid-19: Secondary | ICD-10-CM | POA: Diagnosis not present

## 2019-12-12 HISTORY — DX: Dyspnea, unspecified: R06.00

## 2019-12-12 HISTORY — DX: Pneumonia, unspecified organism: J18.9

## 2019-12-12 LAB — BASIC METABOLIC PANEL
Anion gap: 12 (ref 5–15)
BUN: 66 mg/dL — ABNORMAL HIGH (ref 8–23)
CO2: 25 mmol/L (ref 22–32)
Calcium: 8.8 mg/dL — ABNORMAL LOW (ref 8.9–10.3)
Chloride: 101 mmol/L (ref 98–111)
Creatinine, Ser: 1.91 mg/dL — ABNORMAL HIGH (ref 0.44–1.00)
GFR calc Af Amer: 29 mL/min — ABNORMAL LOW (ref >60)
GFR calc non Af Amer: 25 mL/min — ABNORMAL LOW (ref >60)
Glucose, Bld: 250 mg/dL — ABNORMAL HIGH (ref 70–99)
Potassium: 5 mmol/L (ref 3.5–5.1)
Sodium: 138 mmol/L (ref 135–145)

## 2019-12-12 LAB — CBC
HCT: 30.8 % — ABNORMAL LOW (ref 36.0–46.0)
Hemoglobin: 9.1 g/dL — ABNORMAL LOW (ref 12.0–15.0)
MCH: 26.9 pg (ref 26.0–34.0)
MCHC: 29.5 g/dL — ABNORMAL LOW (ref 30.0–36.0)
MCV: 91.1 fL (ref 80.0–100.0)
Platelets: 487 10*3/uL — ABNORMAL HIGH (ref 150–400)
RBC: 3.38 MIL/uL — ABNORMAL LOW (ref 3.87–5.11)
RDW: 16.4 % — ABNORMAL HIGH (ref 11.5–15.5)
WBC: 11.2 10*3/uL — ABNORMAL HIGH (ref 4.0–10.5)
nRBC: 0 % (ref 0.0–0.2)

## 2019-12-12 LAB — MAGNESIUM: Magnesium: 2 mg/dL (ref 1.6–2.3)

## 2019-12-12 LAB — GLUCOSE, CAPILLARY: Glucose-Capillary: 214 mg/dL — ABNORMAL HIGH (ref 70–99)

## 2019-12-12 LAB — SARS CORONAVIRUS 2 (TAT 6-24 HRS): SARS Coronavirus 2: NEGATIVE

## 2019-12-12 NOTE — Progress Notes (Signed)
Lab report: Cr: 1.91

## 2019-12-12 NOTE — Progress Notes (Addendum)
COVID Vaccine Completed: NO Date COVID Vaccine completed:  COVID vaccine manufacturer: Pfizer    [employer]   PCP - Dr. Odie Sera Cardiologist -  Dr. Candee Furbish. LOV: 11/24/19. :[place]: 12/01/19. Epic/Chart. Chest x-ray - 10/02/19 EKG - 11/24/19 Stress Test -  ECHO - 08/13/19 Cardiac Cath -  A1C: 8.9: 10/30/19 Sleep Study - yes CPAP - yes  Fasting Blood Sugar - 150's Checks Blood Sugar __3___ times a day  Blood Thinner Instructions: Eliquis can be hold 3 days prior surgery as per Dr. Marlou Porch. Aspirin Instructions: Last Dose:  Anesthesia review: Hx: HTN,CHF,CAD,DIA,COPD,OSA(CPAP),Pacemaker. Pt. Does not walk long distances because has balance problems,she gets SOB after walking short distances.  Patient denies shortness of breath, fever, cough and chest pain at PAT appointment   Patient verbalized understanding of instructions that were given to them at the PAT appointment. Patient was also instructed that they will need to review over the PAT instructions again at home before surgery.

## 2019-12-13 NOTE — Anesthesia Preprocedure Evaluation (Addendum)
Anesthesia Evaluation  Patient identified by MRN, date of birth, ID band Patient awake    Reviewed: Allergy & Precautions, NPO status , Patient's Chart, lab work & pertinent test results, reviewed documented beta blocker date and time   History of Anesthesia Complications (+) PONV and history of anesthetic complications  Airway Mallampati: III  TM Distance: >3 FB Neck ROM: Limited    Dental  (+) Edentulous Upper, Partial Lower, Missing, Dental Advisory Given   Pulmonary shortness of breath, with exertion, at rest and lying, sleep apnea and Continuous Positive Airway Pressure Ventilation , pneumonia, resolved, COPD,  COPD inhaler, former smoker,    Pulmonary exam normal breath sounds clear to auscultation       Cardiovascular Exercise Tolerance: Poor hypertension, Pt. on medications and Pt. on home beta blockers + CAD and +CHF  + dysrhythmias Atrial Fibrillation + pacemaker  Rhythm:Irregular Rate:Normal  Mild CAD cath 2016     Neuro/Psych  Headaches, Anxiety  Neuromuscular disease    GI/Hepatic Neg liver ROS, GERD  Controlled and Medicated,  Endo/Other  diabetes, Well Controlled, Type 2, Insulin DependentHypothyroidism Gout Hyperlipidemia Obesity  Renal/GU Renal InsufficiencyRenal disease  negative genitourinary   Musculoskeletal  (+) Arthritis , Osteoarthritis,  Lumbar radiculopathy Diabetic left foot ulcer Hx/o cervical stenosis S/P ACDF multilevel   Abdominal (+) + obese,   Peds  Hematology  (+) anemia , Eliquis therapy- last dose 3 days ago   Anesthesia Other Findings   Reproductive/Obstetrics                            Anesthesia Physical Anesthesia Plan  ASA: III  Anesthesia Plan: MAC   Post-op Pain Management:    Induction: Intravenous  PONV Risk Score and Plan: 4 or greater and Treatment may vary due to age or medical condition and Propofol infusion  Airway Management  Planned: Natural Airway and Simple Face Mask  Additional Equipment:   Intra-op Plan:   Post-operative Plan:   Informed Consent: I have reviewed the patients History and Physical, chart, labs and discussed the procedure including the risks, benefits and alternatives for the proposed anesthesia with the patient or authorized representative who has indicated his/her understanding and acceptance.     Dental advisory given  Plan Discussed with: CRNA and Anesthesiologist  Anesthesia Plan Comments: (See PAT note 12/12/2019, Konrad Felix, PA-C)       Anesthesia Quick Evaluation

## 2019-12-13 NOTE — Progress Notes (Signed)
Anesthesia Chart Review   Case: [id number] Date/Time: 12/15/19 1400   Procedure: AMPUTATION  LEFT GREAT TOE (Left Toe)   Anesthesia type: Monitor Anesthesia Care   Pre-op diagnosis: ULCER OF LEFT FOOT   Location: [facility] [place] [facility]   Surgeons: Evelina Bucy, DPM      DISCUSSION:78 y.o. former smoker (35 pack years, quit 05/04/97) with h/o HTN, PONV, GERD, A-fib (on Eliquis), CAD (Mild per remote cath in 2006), pulmonary HTN, CHF, DM II (A1C 8.9 10/30/2019), COPD, OSA on CPAP, pacemaker (device orders on chart), CKD Stage III, ulcer of left foot scheduled for above procedure 12/15/2019 with Krista Blue, DPM.    S/p C4-5, C5-6, C6-7 ACDF on 11/07/2014 with Dr. Saintclair Halsted.   Per cardiology preoperative evaluation 12/04/2019, "Chart reviewed as part of pre-operative protocol coverage. Given past medical history and time since last visit, based on ACC/AHA guidelines, Kayla [name] would be at acceptable risk for the planned procedure without further cardiovascular testing.  Per his primary cardiologist, Dr. Marlou Porch, she may hold her Eliquis 3 days prior to her procedure. In the setting of atrial fibrillation with CHADS2VASC score of 7 (CHF, HTN, AGEx2, female, DM2, CAD). Due to her elevated CHADS2VASc score though she remains high risk off anticoagulation, she may proceed as planned."  Anticipate pt can proceed with planned procedure barring acute status change.   VS: BP (!) 133/42   Pulse 69   Temp 37 C (Oral)   Resp 18   Ht 5\' 6"  (1.676 m)   Wt 107.2 kg   SpO2 95%   BMI 38.16 kg/m   PROVIDERS: Hamrick, Lorin Mercy, MD is PCP   Candee Furbish, MD is Cardiologist  LABS: Labs reviewed: Acceptable for surgery. (all labs ordered are listed, but only abnormal results are displayed)  Labs Reviewed  CBC - Abnormal; Notable for the following components:      Result Value   WBC 11.2 (*)    RBC 3.38 (*)    Hemoglobin 9.1 (*)    HCT 30.8 (*)    MCHC 29.5 (*)    RDW 16.4 (*)    Platelets 487 (*)     All other components within normal limits  BASIC METABOLIC PANEL - Abnormal; Notable for the following components:   Glucose, Bld 250 (*)    BUN 66 (*)    Creatinine, Ser 1.91 (*)    Calcium 8.8 (*)    GFR calc non Af Amer 25 (*)    GFR calc Af Amer 29 (*)    All other components within normal limits  GLUCOSE, CAPILLARY - Abnormal; Notable for the following components:   Glucose-Capillary 214 (*)    All other components within normal limits     IMAGES: CT Coronary Fraction Flow 12/31/2017 1. Left Main:  No significant stenosis.  2. LAD: No significant stenosis. 3. LCX: No significant stenosis. 4. OM1: No significant stenosis. 5. OM2: Distal OM2: CT FFR: 0.78. 6. OM3: No significant stenosis. 7. RCA: No significant stenosis.  IMPRESSION: 1. CT FFR analysis showed stenosis in the distal portion of a small OM2 artery. Aggressive medical management is recommended.  EKG: 11/24/2019 Rate 76 bpm    CV: Echo 08/13/2019 IMPRESSIONS    1. Left ventricular ejection fraction, by estimation, is 65 to 70%. The  left ventricle has normal function. The left ventricle has no regional  wall motion abnormalities. There is moderate concentric left ventricular  hypertrophy. Unable to assess LV  diastolic filling due  to underlying atrial fibrillation.  2. Right ventricular systolic function is normal. The right ventricular  size is normal. There is normal pulmonary artery systolic pressure.  3. Right atrial size was moderately dilated.  4. The mitral valve is normal in structure. No evidence of mitral valve  regurgitation. No evidence of mitral stenosis.  5. The aortic valve is tricuspid. Aortic valve regurgitation is not  visualized. Mild aortic valve sclerosis is present, with no evidence of  aortic valve stenosis.  6. The inferior vena cava is dilated in size with >50% respiratory  variability, suggesting right atrial pressure of 8 mmHg. Past Medical History:  Diagnosis Date   . Allergy   . Anxiety   . Arthritis   . CAD (coronary artery disease)    Mild per remote cath in 2006, /    Nuclear, January, 2013, no significant abnormality,  . Carotid artery disease ([place])    Doppler, January, 2013, 0 28% RIC A., 76-81% LICA, stable  . Cataract   . CHF (congestive heart failure) ([place])   . Complication of anesthesia    wakes up "mean"  . COPD 02/16/2007   Intolerant of Spiriva, tudorza Intolerant of Trelegy   . COPD (chronic obstructive pulmonary disease) ([place])   . Coronary atherosclerosis 11/24/2008   Catheterization 2006, nonobstructive coronary disease   //   nuclear 2013, low risk    . Diabetes mellitus, type 2 ([place])   . Diverticula of colon   . DIVERTICULOSIS OF COLON 03/23/2007   Qualifier: Diagnosis of  By: Halford Chessman MD, [place]    . Dizziness    occasional  . Dizzy spells 05/04/2011  . DM (diabetes mellitus) ([place]) 03/23/2007   Qualifier: Diagnosis of  By: Halford Chessman MD, Vineet    . Dyspnea    On exertion  . Ejection fraction   . Essential hypertension 02/16/2007   Qualifier: Diagnosis of  By: Rosana Hoes CMA, Tammy    . G E R D 03/23/2007   Qualifier: Diagnosis of  By: Halford Chessman MD, Vineet    . Gall bladder disease 04/28/2016  . Gastritis and gastroduodenitis   . GERD (gastroesophageal reflux disease)   . Goiter    hx of  . Headache(784.0)    migraine  . Hyperlipidemia   . Hypertension   . Hypothyroidism   . HYPOTHYROIDISM, POSTSURGICAL 06/04/2008   Qualifier: Diagnosis of  By: Loanne Drilling MD, Jacelyn Pi   . Left ventricular hypertrophy   . Leg edema    occasional swelling  . Leg swelling 12/09/2018  . Myofascial pain syndrome, cervical 01/06/2018  . Obesity   . OBESITY 11/24/2008   Qualifier: Diagnosis of  By: Sidney Ace    . OBSTRUCTIVE SLEEP APNEA 02/16/2007   Auto CPAP 09/03/13 to 10/02/13 >> used on 30 of 30 nights with average 6 hrs and 3 min.  Average AHI is 3.1 with median CPAP 5 cm H2O and 95 th percentile CPAP 6 cm H2O.    . Occipital neuralgia of right side  01/06/2018  . OSA (obstructive sleep apnea)    cpap setting of 12  . Pain in joint of right hip 01/21/2018  . Palpitations 01/21/2011   48 hour Holter, 2015, scattered PACs and PVCs.   . Pneumonia   . PONV (postoperative nausea and vomiting)    severe after every surgery  . Pulmonary hypertension, secondary   . RHINITIS 07/21/2010       . RUQ abdominal pain   . S/P left TKA 07/27/2011  . Sinus  node dysfunction ([place]) 11/08/2015  . Sleep apnea   . Spinal stenosis of cervical region 11/07/2014  . Urinary frequency 12/09/2018  . UTI (urinary tract infection) 08/15/2019   per pt report  . VENTRICULAR HYPERTROPHY, LEFT 11/24/2008   Qualifier: Diagnosis of  By: Sidney Ace      Past Surgical History:  Procedure Laterality Date  . ABDOMINAL HYSTERECTOMY     with appendectomy and colon polypectomy  . ANTERIOR CERVICAL DECOMP/DISCECTOMY FUSION N/A 11/07/2014   Procedure: Anterior Cervical diskectomy and fusion Cervical four-five, Cervical five-six, Cervical six-seven;  Surgeon: Kary Kos, MD;  Location: [facility];  Service: Neurosurgery;  Laterality: N/A;  . APPENDECTOMY    . ARTERY BIOPSY Right 02/16/2017   Procedure: BIOPSY TEMPORAL ARTERY;  Surgeon: Katha Cabal, MD;  Location: [facility] ORS;  Service: Vascular;  Laterality: Right;  . BACK SURGERY    . CARDIAC CATHETERIZATION    . CATARACT EXTRACTION W/ INTRAOCULAR LENS  IMPLANT, BILATERAL Bilateral   . CHOLECYSTECTOMY N/A 04/29/2016   Procedure: LAPAROSCOPIC CHOLECYSTECTOMY WITH INTRAOPERATIVE CHOLANGIOGRAM;  Surgeon: Alphonsa Overall, MD;  Location: [facility];  Service: General;  Laterality: N/A;  . EP IMPLANTABLE DEVICE N/A 11/08/2015   MRI COMPATABLE -- Procedure: Pacemaker Implant;  Surgeon: Deboraha Sprang, MD;  Location: [facility];  Service: Cardiovascular;  Laterality: N/A;  . INSERT / REPLACE / REMOVE PACEMAKER  2017   Dr. Jens Som [facility])  . JOINT REPLACEMENT    . LUMBAR DISC SURGERY  1990's   x 2  . THYROIDECTOMY  2011  .  TOTAL KNEE ARTHROPLASTY  07/27/2011   Procedure: TOTAL KNEE ARTHROPLASTY;  Surgeon: Mauri Pole, MD;  Location: [facility];  Service: Orthopedics;  Laterality: Left;  . UPPER ESOPHAGEAL ENDOSCOPIC ULTRASOUND (EUS) N/A 05/14/2016   Procedure: UPPER ESOPHAGEAL ENDOSCOPIC ULTRASOUND (EUS);  Surgeon: Milus Banister, MD;  Location: [facility];  Service: Endoscopy;  Laterality: N/A;  radial only-will be done mod if needed    MEDICATIONS: . acetaminophen (TYLENOL) 500 MG tablet  . albuterol (PROVENTIL) (2.5 MG/3ML) 0.083% nebulizer solution  . albuterol (VENTOLIN HFA) 108 (90 Base) MCG/ACT inhaler  . Alcohol Swabs (B-D SINGLE USE SWABS REGULAR) PADS  . allopurinol (ZYLOPRIM) 100 MG tablet  . amLODipine (NORVASC) 5 MG tablet  . apixaban (ELIQUIS) 5 MG TABS tablet  . atorvastatin (LIPITOR) 40 MG tablet  . Blood Glucose Calibration (TRUE METRIX LEVEL 1) Low SOLN  . budesonide-formoterol (SYMBICORT) 160-4.5 MCG/ACT inhaler  . Cholecalciferol (VITAMIN D-3 PO)  . Dextromethorphan-guaiFENesin (TUSSIN DM MAX SUGAR-FREE PO)  . dofetilide (TIKOSYN) 250 MCG capsule  . doxycycline (VIBRA-TABS) 100 MG tablet  . fluticasone (FLONASE) 50 MCG/ACT nasal spray  . gabapentin (NEURONTIN) 800 MG tablet  . HYDROcodone-acetaminophen (NORCO/VICODIN) 5-325 MG tablet  . insulin aspart (NOVOLOG FLEXPEN) 100 UNIT/ML FlexPen  . insulin glargine (LANTUS) 100 UNIT/ML injection  . levothyroxine (SYNTHROID) 175 MCG tablet  . LORazepam (ATIVAN) 2 MG tablet  . losartan (COZAAR) 100 MG tablet  . Magnesium Oxide 400 (240 Mg) MG TABS  . metoprolol succinate (TOPROL-XL) 50 MG 24 hr tablet  . nitroGLYCERIN (NITROSTAT) 0.4 MG SL tablet  . PRESCRIPTION MEDICATION  . promethazine (PHENADOZ) 25 MG suppository  . silver sulfADIAZINE (SILVADENE) 1 % cream  . Tiotropium Bromide Monohydrate (SPIRIVA RESPIMAT) 2.5 MCG/ACT AERS  . torsemide (DEMADEX) 20 MG tablet  . TRUE METRIX BLOOD GLUCOSE TEST test strip  . TRUEplus Lancets 28G  MISC  . vitamin B-12 (CYANOCOBALAMIN) 1000 MCG tablet  No current facility-administered medications for this encounter.    Konrad Felix, PA-C [facility] [phone]

## 2019-12-14 ENCOUNTER — Telehealth: Payer: Self-pay | Admitting: *Deleted

## 2019-12-14 NOTE — Telephone Encounter (Signed)
"  Dr. March Rummage is my surgery.  He's removing one of my toes on Friday.  No one has told me to stop taking the Eliquis.  I'd like to know if I should stop taking the Eliquis or should there be days that I don't take Eliquis?

## 2019-12-15 ENCOUNTER — Ambulatory Visit (HOSPITAL_COMMUNITY): Payer: Medicare HMO | Admitting: Physician Assistant

## 2019-12-15 ENCOUNTER — Encounter (HOSPITAL_COMMUNITY)
Admission: RE | Disposition: A | Discharge status: Home / Self Care | Payer: Self-pay | Source: Home / Self Care | Attending: Podiatry

## 2019-12-15 ENCOUNTER — Ambulatory Visit (HOSPITAL_COMMUNITY): Payer: Medicare HMO | Admitting: Certified Registered Nurse Anesthetist

## 2019-12-15 ENCOUNTER — Ambulatory Visit (HOSPITAL_COMMUNITY): Payer: Medicare HMO

## 2019-12-15 ENCOUNTER — Encounter: Payer: Medicare HMO | Admitting: Physical Therapy

## 2019-12-15 ENCOUNTER — Ambulatory Visit (HOSPITAL_COMMUNITY)
Admission: RE | Admit: 2019-12-15 | Discharge: 2019-12-15 | Disposition: A | Discharge status: Home / Self Care | Payer: Medicare HMO | Attending: Podiatry | Admitting: Podiatry

## 2019-12-15 ENCOUNTER — Other Ambulatory Visit (HOSPITAL_COMMUNITY): Payer: Self-pay | Admitting: Physician Assistant

## 2019-12-15 DIAGNOSIS — Z950 Presence of cardiac pacemaker: Secondary | ICD-10-CM | POA: Diagnosis not present

## 2019-12-15 DIAGNOSIS — E1169 Type 2 diabetes mellitus with other specified complication: Secondary | ICD-10-CM | POA: Insufficient documentation

## 2019-12-15 DIAGNOSIS — K219 Gastro-esophageal reflux disease without esophagitis: Secondary | ICD-10-CM | POA: Diagnosis not present

## 2019-12-15 DIAGNOSIS — Z6838 Body mass index (BMI) 38.0-38.9, adult: Secondary | ICD-10-CM | POA: Insufficient documentation

## 2019-12-15 DIAGNOSIS — E11621 Type 2 diabetes mellitus with foot ulcer: Secondary | ICD-10-CM | POA: Diagnosis not present

## 2019-12-15 DIAGNOSIS — I5033 Acute on chronic diastolic (congestive) heart failure: Secondary | ICD-10-CM | POA: Diagnosis not present

## 2019-12-15 DIAGNOSIS — Z7901 Long term (current) use of anticoagulants: Secondary | ICD-10-CM | POA: Insufficient documentation

## 2019-12-15 DIAGNOSIS — I110 Hypertensive heart disease with heart failure: Secondary | ICD-10-CM | POA: Insufficient documentation

## 2019-12-15 DIAGNOSIS — M86672 Other chronic osteomyelitis, left ankle and foot: Secondary | ICD-10-CM | POA: Diagnosis not present

## 2019-12-15 DIAGNOSIS — L97522 Non-pressure chronic ulcer of other part of left foot with fat layer exposed: Secondary | ICD-10-CM | POA: Insufficient documentation

## 2019-12-15 DIAGNOSIS — Z794 Long term (current) use of insulin: Secondary | ICD-10-CM | POA: Insufficient documentation

## 2019-12-15 DIAGNOSIS — M2042 Other hammer toe(s) (acquired), left foot: Secondary | ICD-10-CM | POA: Insufficient documentation

## 2019-12-15 DIAGNOSIS — E1142 Type 2 diabetes mellitus with diabetic polyneuropathy: Secondary | ICD-10-CM | POA: Diagnosis not present

## 2019-12-15 DIAGNOSIS — S90852A Superficial foreign body, left foot, initial encounter: Secondary | ICD-10-CM | POA: Diagnosis not present

## 2019-12-15 DIAGNOSIS — J449 Chronic obstructive pulmonary disease, unspecified: Secondary | ICD-10-CM | POA: Diagnosis not present

## 2019-12-15 DIAGNOSIS — M2012 Hallux valgus (acquired), left foot: Secondary | ICD-10-CM | POA: Diagnosis not present

## 2019-12-15 DIAGNOSIS — Z9889 Other specified postprocedural states: Secondary | ICD-10-CM

## 2019-12-15 DIAGNOSIS — L97529 Non-pressure chronic ulcer of other part of left foot with unspecified severity: Secondary | ICD-10-CM | POA: Diagnosis not present

## 2019-12-15 DIAGNOSIS — E669 Obesity, unspecified: Secondary | ICD-10-CM | POA: Insufficient documentation

## 2019-12-15 DIAGNOSIS — Z87891 Personal history of nicotine dependence: Secondary | ICD-10-CM | POA: Insufficient documentation

## 2019-12-15 DIAGNOSIS — L089 Local infection of the skin and subcutaneous tissue, unspecified: Secondary | ICD-10-CM | POA: Diagnosis not present

## 2019-12-15 DIAGNOSIS — G4730 Sleep apnea, unspecified: Secondary | ICD-10-CM | POA: Insufficient documentation

## 2019-12-15 DIAGNOSIS — I2510 Atherosclerotic heart disease of native coronary artery without angina pectoris: Secondary | ICD-10-CM | POA: Insufficient documentation

## 2019-12-15 DIAGNOSIS — G4733 Obstructive sleep apnea (adult) (pediatric): Secondary | ICD-10-CM | POA: Diagnosis not present

## 2019-12-15 DIAGNOSIS — I480 Paroxysmal atrial fibrillation: Secondary | ICD-10-CM | POA: Diagnosis not present

## 2019-12-15 HISTORY — PX: AMPUTATION TOE: SHX6595

## 2019-12-15 LAB — GLUCOSE, CAPILLARY
Glucose-Capillary: 77 mg/dL (ref 70–99)
Glucose-Capillary: 104 mg/dL — ABNORMAL HIGH (ref 70–99)
Glucose-Capillary: 46 mg/dL — ABNORMAL LOW (ref 70–99)

## 2019-12-15 SURGERY — AMPUTATION, TOE
Anesthesia: Monitor Anesthesia Care | Site: Toe | Laterality: Left

## 2019-12-15 MED ORDER — CHLORHEXIDINE GLUCONATE 0.12 % MT SOLN
15.0000 mL | Freq: Once | OROMUCOSAL | Status: AC
  Administered 2019-12-15: 15 mL via OROMUCOSAL

## 2019-12-15 MED ORDER — VANCOMYCIN HCL 1000 MG IV SOLR
INTRAVENOUS | Status: DC | PRN
  Administered 2019-12-15: 1000 mg

## 2019-12-15 MED ORDER — CEFAZOLIN SODIUM-DEXTROSE 2-4 GM/100ML-% IV SOLN
INTRAVENOUS | Status: AC
  Filled 2019-12-15: qty 100

## 2019-12-15 MED ORDER — DEXTROSE 50 % IV SOLN
25.0000 mL | Freq: Once | INTRAVENOUS | Status: AC
  Administered 2019-12-15: 25 mL via INTRAVENOUS

## 2019-12-15 MED ORDER — PROPOFOL 500 MG/50ML IV EMUL
INTRAVENOUS | Status: AC
  Filled 2019-12-15: qty 50

## 2019-12-15 MED ORDER — BUPIVACAINE HCL (PF) 0.5 % IJ SOLN
INTRAMUSCULAR | Status: AC
  Filled 2019-12-15: qty 30

## 2019-12-15 MED ORDER — FENTANYL CITRATE (PF) 100 MCG/2ML IJ SOLN: 25.0000 ug | INTRAMUSCULAR | Status: DC | PRN

## 2019-12-15 MED ORDER — ORAL CARE MOUTH RINSE: 15.0000 mL | Freq: Once | OROMUCOSAL | Status: AC

## 2019-12-15 MED ORDER — FENTANYL CITRATE (PF) 100 MCG/2ML IJ SOLN
INTRAMUSCULAR | Status: DC | PRN
  Administered 2019-12-15 (×2): 25 ug via INTRAVENOUS

## 2019-12-15 MED ORDER — LIDOCAINE 2% (20 MG/ML) 5 ML SYRINGE
INTRAMUSCULAR | Status: AC
  Filled 2019-12-15: qty 5

## 2019-12-15 MED ORDER — CEPHALEXIN 500 MG PO CAPS: 500.0000 mg | ORAL_CAPSULE | Freq: Two times a day (BID) | ORAL | 0 refills | Status: DC

## 2019-12-15 MED ORDER — OXYCODONE-ACETAMINOPHEN 5-325 MG PO TABS: 1.0000 | ORAL_TABLET | ORAL | 0 refills | Status: DC | PRN

## 2019-12-15 MED ORDER — PROPOFOL 10 MG/ML IV BOLUS
INTRAVENOUS | Status: DC | PRN
  Administered 2019-12-15 (×4): 10 mg via INTRAVENOUS

## 2019-12-15 MED ORDER — BUPIVACAINE HCL (PF) 0.5 % IJ SOLN
INTRAMUSCULAR | Status: DC | PRN
  Administered 2019-12-15: 10 mL

## 2019-12-15 MED ORDER — ONDANSETRON HCL 4 MG/2ML IJ SOLN: 4.0000 mg | Freq: Once | INTRAMUSCULAR | Status: DC | PRN

## 2019-12-15 MED ORDER — ONDANSETRON HCL 4 MG/2ML IJ SOLN
INTRAMUSCULAR | Status: AC
  Filled 2019-12-15: qty 2

## 2019-12-15 MED ORDER — PROPOFOL 10 MG/ML IV BOLUS
INTRAVENOUS | Status: AC
  Filled 2019-12-15: qty 20

## 2019-12-15 MED ORDER — CEFAZOLIN SODIUM-DEXTROSE 2-4 GM/100ML-% IV SOLN
2.0000 g | INTRAVENOUS | Status: AC
  Administered 2019-12-15: 2 g via INTRAVENOUS

## 2019-12-15 MED ORDER — VANCOMYCIN HCL 1000 MG IV SOLR
INTRAVENOUS | Status: AC
  Filled 2019-12-15: qty 1000

## 2019-12-15 MED ORDER — DEXTROSE 50 % IV SOLN
INTRAVENOUS | Status: AC
  Filled 2019-12-15: qty 50

## 2019-12-15 MED ORDER — PROPOFOL 500 MG/50ML IV EMUL
INTRAVENOUS | Status: DC | PRN
  Administered 2019-12-15: 50 ug/kg/min via INTRAVENOUS

## 2019-12-15 MED ORDER — ONDANSETRON HCL 4 MG/2ML IJ SOLN
INTRAMUSCULAR | Status: DC | PRN
  Administered 2019-12-15: 4 mg via INTRAVENOUS

## 2019-12-15 MED ORDER — LIDOCAINE 2% (20 MG/ML) 5 ML SYRINGE
INTRAMUSCULAR | Status: DC | PRN
  Administered 2019-12-15: 20 mg via INTRAVENOUS

## 2019-12-15 MED ORDER — LACTATED RINGERS IV SOLN: INTRAVENOUS | Status: DC

## 2019-12-15 MED ORDER — FENTANYL CITRATE (PF) 100 MCG/2ML IJ SOLN
INTRAMUSCULAR | Status: AC
  Filled 2019-12-15: qty 2

## 2019-12-15 SURGICAL SUPPLY — 41 items
BLADE SURG 15 STRL LF DISP TIS (BLADE) ×1 IMPLANT
BLADE SURG 15 STRL SS (BLADE) ×2
BNDG ELASTIC 3X5.8 VLCR STR LF (GAUZE/BANDAGES/DRESSINGS) ×2 IMPLANT
BNDG ELASTIC 4X5.8 VLCR STR LF (GAUZE/BANDAGES/DRESSINGS) ×2 IMPLANT
BNDG ELASTIC 6X5.8 VLCR STR LF (GAUZE/BANDAGES/DRESSINGS) ×2 IMPLANT
CLEANER TIP ELECTROSURG 2X2 (MISCELLANEOUS) ×2 IMPLANT
CNTNR URN SCR LID CUP LEK RST (MISCELLANEOUS) ×1 IMPLANT
CONT SPEC 4OZ STRL OR WHT (MISCELLANEOUS) ×2
COVER MAYO STAND STRL (DRAPES) ×2 IMPLANT
COVER SURGICAL LIGHT HANDLE (MISCELLANEOUS) ×2 IMPLANT
COVER WAND RF STERILE (DRAPES) IMPLANT
CUFF TOURN SGL QUICK 24 (TOURNIQUET CUFF) ×2
CUFF TRNQT CYL 24X4X16.5-23 (TOURNIQUET CUFF) ×1 IMPLANT
DECANTER SPIKE VIAL GLASS SM (MISCELLANEOUS) ×2 IMPLANT
GAUZE SPONGE 4X4 12PLY STRL (GAUZE/BANDAGES/DRESSINGS) ×2 IMPLANT
GAUZE XEROFORM 1X8 LF (GAUZE/BANDAGES/DRESSINGS) ×2 IMPLANT
GLOVE BIO SURGEON STRL SZ7.5 (GLOVE) ×2 IMPLANT
GLOVE BIOGEL PI IND STRL 8 (GLOVE) ×1 IMPLANT
GLOVE BIOGEL PI INDICATOR 8 (GLOVE) ×1
GOWN STRL REUS W/TWL XL LVL3 (GOWN DISPOSABLE) ×2 IMPLANT
KIT BASIN OR (CUSTOM PROCEDURE TRAY) ×2 IMPLANT
KIT TURNOVER KIT A (KITS) IMPLANT
MARKER SKIN DUAL TIP RULER LAB (MISCELLANEOUS) ×2 IMPLANT
NEEDLE HYPO 25X1 1.5 SAFETY (NEEDLE) ×2 IMPLANT
PACK ORTHO EXTREMITY (CUSTOM PROCEDURE TRAY) ×2 IMPLANT
PADDING CAST SYNTHETIC 2 (CAST SUPPLIES) ×1
PADDING CAST SYNTHETIC 2X4 NS (CAST SUPPLIES) ×1 IMPLANT
PADDING UNDERCAST 2 STRL (CAST SUPPLIES) ×1
PADDING UNDERCAST 2X4 STRL (CAST SUPPLIES) ×1 IMPLANT
PENCIL SMOKE EVACUATOR (MISCELLANEOUS) IMPLANT
STAPLER VISISTAT 35W (STAPLE) ×2 IMPLANT
SUT ETHILON 4 0 PS 2 18 (SUTURE) ×2 IMPLANT
SUT PROLENE 4 0 PS 2 18 (SUTURE) ×2 IMPLANT
SUT VIC AB 1 CT1 27 (SUTURE) ×2
SUT VIC AB 1 CT1 27XBRD ANTBC (SUTURE) ×1 IMPLANT
SUT VIC AB 4-0 PS2 18 (SUTURE) ×2 IMPLANT
SYR 20ML LL LF (SYRINGE) ×2 IMPLANT
TOWEL OR 17X26 10 PK STRL BLUE (TOWEL DISPOSABLE) ×2 IMPLANT
TOWEL OR NON WOVEN STRL DISP B (DISPOSABLE) ×2 IMPLANT
TRAY PREP A LATEX SAFE STRL (SET/KITS/TRAYS/PACK) ×2 IMPLANT
UNDERPAD 30X36 HEAVY ABSORB (UNDERPADS AND DIAPERS) ×8 IMPLANT

## 2019-12-15 NOTE — Interval H&P Note (Signed)
History and Physical Interval Note:  12/15/2019 1:31 PM  Kayla [name]  has presented today for surgery, with the diagnosis of ULCER OF LEFT FOOT.  The various methods of treatment have been discussed with the patient and family. After consideration of risks, benefits and other options for treatment, the patient has consented to  Procedure(s): AMPUTATION  LEFT GREAT TOE (Left) as a surgical intervention.  The patient's history has been reviewed, patient examined, no change in status, stable for surgery.  I have reviewed the patient's chart and labs.  Questions were answered to the patient's satisfaction.     Evelina Bucy

## 2019-12-15 NOTE — Telephone Encounter (Signed)
I called and left a message for Ms. Kayla [name] that she does not need to stop taking the Eliquis.  I am calling to let you know that Dr. March Rummage said you did not need to stop taking the Eliquis.  "I did stop it.  I stopped it on Monday.  That's what my papers said.  Is that going to be a problem?"  I don't think it will.  "I'll start it back today after my surgery.  I'm headed there now."

## 2019-12-15 NOTE — Telephone Encounter (Signed)
Tried calling patient, no answer.   It is ok for her to continue the eliquis

## 2019-12-15 NOTE — Transfer of Care (Signed)
Immediate Anesthesia Transfer of Care Note  Patient: Kayla [name]  Procedure(s) Performed: AMPUTATION  LEFT GREAT TOE (Left Toe)  Patient Location: PACU  Anesthesia Type:General  Level of Consciousness: drowsy and patient cooperative  Airway & Oxygen Therapy: Patient Spontanous Breathing and Patient connected to face mask oxygen  Post-op Assessment: Report given to RN and Post -op Vital signs reviewed and stable  Post vital signs: Reviewed and stable  Last Vitals:  Vitals Value Taken Time  BP 124/51 12/15/19 1434  Temp 36.5 C 12/15/19 1434  Pulse 75 12/15/19 1441  Resp 26 12/15/19 1441  SpO2 93 % 12/15/19 1441  Vitals shown include unvalidated device data.  Last Pain:  Vitals:   12/15/19 1434  TempSrc:   PainSc: 0-No pain         Complications: No complications documented.

## 2019-12-15 NOTE — Discharge Instructions (Signed)
After Surgery Instructions   1) If you are recuperating from surgery anywhere other than home, please be sure to leave [place] the number where you can be reached.  2) Go directly home and rest.  3) Keep the operated foot(feet) elevated six inches above the hip when sitting or lying down. This will help control swelling and pain.  4) Support the elevated foot and leg with pillows. DO NOT PLACE PILLOWS UNDER THE KNEE.  5) DO NOT REMOVE or get your bandages WET, unless you were given different instructions by your doctor to do so. This increases the risk of infection.  6) Wear your surgical shoe or surgical boot at all times when you are up on your feet.  7) A limited amount of pain and swelling may occur. The skin may take on a bruised appearance. DO NOT BE ALARMED, THIS IS NORMAL.  8) For slight pain and swelling, apply an ice pack directly over the bandages for 15 minutes only out of each hour of the day. Continue until seen in the office for your first post op visit. DO NOT APPLY ANY FORM OF HEAT TO THE AREA.  9) Have prescriptions filled immediately and take as directed.  10) Drink lots of liquids, water and juice to stay hydrated.  11) CALL IMMEDIATELY IF:  *Bleeding continues until the following day of surgery  *Pain increases and/or does not respond to medication  *Bandages or cast appears to tight  *If your bandage gets wet  *Trip, fall or stump your surgical foot  *If your temperature goes above 101  *If you have ANY questions at all  12) You are expected to be weightbearing after your surgery.   If you need to reach the nurse for any reason, please call: [place]: [phone] [place]: [phone] [place]: [phone]    General Anesthesia, Adult, Care After This sheet gives you information about how to care for yourself after your procedure. Your health care provider may also give you more specific instructions. If you have problems or  questions, contact your health care provider. What can I expect after the procedure? After the procedure, the following side effects are common:  Pain or discomfort at the IV site.  Nausea.  Vomiting.  Sore throat.  Trouble concentrating.  Feeling cold or chills.  Weak or tired.  Sleepiness and fatigue.  Soreness and body aches. These side effects can affect parts of the body that were not involved in surgery. Follow these instructions at home:  For at least 24 hours after the procedure:  Have a responsible adult stay with you. It is important to have someone help care for you until you are awake and alert.  Rest as needed.  Do not: ? Participate in activities in which you could fall or become injured. ? Drive. ? Use heavy machinery. ? Drink alcohol. ? Take sleeping pills or medicines that cause drowsiness. ? Make important decisions or sign legal documents. ? Take care of children on your own. Eating and drinking  Follow any instructions from your health care provider about eating or drinking restrictions.  When you feel hungry, start by eating small amounts of foods that are soft and easy to digest (bland), such as toast. Gradually return to your regular diet.  Drink enough fluid to keep your urine pale yellow.  If you vomit, rehydrate by drinking water, juice, or clear broth. General instructions  If you have sleep apnea, surgery and certain medicines can increase your risk  for breathing problems. Follow instructions from your health care provider about wearing your sleep device: ? Anytime you are sleeping, including during daytime naps. ? While taking prescription pain medicines, sleeping medicines, or medicines that make you drowsy.  Return to your normal activities as told by your health care provider. Ask your health care provider what activities are safe for you.  Take over-the-counter and prescription medicines only as told by your health care  provider.  If you smoke, do not smoke without supervision.  Keep all follow-up visits as told by your health care provider. This is important. Contact a health care provider if:  You have nausea or vomiting that does not get better with medicine.  You cannot eat or drink without vomiting.  You have pain that does not get better with medicine.  You are unable to pass urine.  You develop a skin rash.  You have a fever.  You have redness around your IV site that gets worse. Get help right away if:  You have difficulty breathing.  You have chest pain.  You have blood in your urine or stool, or you vomit blood. Summary  After the procedure, it is common to have a sore throat or nausea. It is also common to feel tired.  Have a responsible adult stay with you for the first 24 hours after general anesthesia. It is important to have someone help care for you until you are awake and alert.  When you feel hungry, start by eating small amounts of foods that are soft and easy to digest (bland), such as toast. Gradually return to your regular diet.  Drink enough fluid to keep your urine pale yellow.  Return to your normal activities as told by your health care provider. Ask your health care provider what activities are safe for you. This information is not intended to replace advice given to you by your health care provider. Make sure you discuss any questions you have with your health care provider. Document Revised: 04/23/2017 Document Reviewed: 12/04/2016 Elsevier Patient Education  [place].

## 2019-12-15 NOTE — Anesthesia Postprocedure Evaluation (Signed)
Anesthesia Post Note  Patient: Kayla [name]  Procedure(s) Performed: AMPUTATION  LEFT GREAT TOE (Left Toe)     Patient location during evaluation: PACU Anesthesia Type: MAC Level of consciousness: awake and alert and oriented Pain management: pain level controlled Vital Signs Assessment: post-procedure vital signs reviewed and stable Respiratory status: spontaneous breathing, nonlabored ventilation and respiratory function stable Cardiovascular status: stable and blood pressure returned to baseline Postop Assessment: no apparent nausea or vomiting Anesthetic complications: no   No complications documented.  Last Vitals:  Vitals:   12/15/19 1445 12/15/19 1500  BP: (!) 126/45 128/61  Pulse: 74 72  Resp: (!) 27 20  Temp:  37 C  SpO2: 92% 95%    Last Pain:  Vitals:   12/15/19 1500  TempSrc:   PainSc: 0-No pain                 Dhilan Brauer A.

## 2019-12-15 NOTE — Op Note (Signed)
  Patient Name: Kayla [name] DOB: [birth year]  MRN: [id number]   Date of Surgery: 12/15/2019  Surgeon: Dr. Hardie Pulley, DPM Assistants: None  Pre-operative Diagnosis:  * No Diagnosis Codes entered * Post-operative Diagnosis:  * No Diagnosis Codes entered * Procedures:  1) Amputation left great toe interphalangeal Pathology/Specimens: ID Type Source Tests Collected by Time Destination  1 : Left Great Toe Amputation Toe, Left SURGICAL PATHOLOGY Evelina Bucy, DPM 12/15/2019 1409   A : Bone Left Great Toe Tissue Bone ANAEROBIC CULTURE, GRAM STAIN, AEROBIC/ANAEROBIC CULTURE (SURGICAL/DEEP WOUND) Evelina Bucy, DPM 12/15/2019 1408    Anesthesia: MAC/local Hemostasis: * No tourniquets in log * Estimated Blood Loss: 3 mL Materials: * No implants in log * Medications: 1g Vancomycin Complications: none  Indications for Procedure:  This is a 79 y.o. female with osteomyelitis of the left great toe. She presents for amputation. All risks, benefits, and alternatives were discussed. No guarantees given.   Procedure in Detail: Patient was identified in pre-operative holding area. Formal consent was signed and the left lower extremity was marked. Patient was brought back to the operating room. Anesthesia was induced. The extremity was prepped and draped in the usual sterile fashion. Timeout was taken to confirm patient name, laterality, and procedure prior to incision.    Attention was then directed to the left great toe where an incision was made at the distal interphalangeal joint - racquet shaped with care to preserve all the lateral skin of the toe.. Dissection was carried down to level of bone.  Dissection was continued to the interphalangeal joint and all collateral ligaments were freed at the joint.  The bone soft tissue attachments of the distal phalanx were removed and passed for pathology. A rongeur was used to take a portion of the proximal phalanx for microbiology. The remaining  phalangeal head appeared healthy and viable.  The area was copiously irrigated. Vancomycin was packed into the wound. The skin was reapproximated with nylon and skin staples.   Disposition: Following a period of post-operative monitoring, patient will be transferred home.Marland Kitchen

## 2019-12-16 ENCOUNTER — Other Ambulatory Visit: Payer: Self-pay | Admitting: Podiatry

## 2019-12-16 ENCOUNTER — Encounter (HOSPITAL_COMMUNITY): Payer: Self-pay | Admitting: Podiatry

## 2019-12-16 MED ORDER — OXYCODONE-ACETAMINOPHEN 10-325 MG PO TABS: 1.0000 | ORAL_TABLET | ORAL | 0 refills | Status: DC | PRN

## 2019-12-16 NOTE — Progress Notes (Signed)
Called patient for post-op check. Having more pain than she expected. Pain medication helps some. Discussed increasing dose to 10/325. Patient verbalized understanding and is in agreement. Rx sent.  Also discussed metallic FB seen on XR, not seen on previous XR but with metallic artifact on MRI. This is not new from surgery and concerning for retained surgical material. Discussed with patient, she is not having any pain or issue at this area. Will monitor clinically.

## 2019-12-18 LAB — SURGICAL PATHOLOGY

## 2019-12-18 LAB — GLUCOSE, CAPILLARY: Glucose-Capillary: 44 mg/dL — CL (ref 70–99)

## 2019-12-19 ENCOUNTER — Encounter: Payer: Medicare HMO | Admitting: Physical Therapy

## 2019-12-20 LAB — AEROBIC/ANAEROBIC CULTURE W GRAM STAIN (SURGICAL/DEEP WOUND): Gram Stain: NONE SEEN

## 2019-12-21 ENCOUNTER — Telehealth: Payer: Self-pay

## 2019-12-21 ENCOUNTER — Ambulatory Visit (INDEPENDENT_AMBULATORY_CARE_PROVIDER_SITE_OTHER): Payer: Medicare HMO | Admitting: *Deleted

## 2019-12-21 DIAGNOSIS — I495 Sick sinus syndrome: Secondary | ICD-10-CM | POA: Diagnosis not present

## 2019-12-21 LAB — CUP PACEART REMOTE DEVICE CHECK
Battery Remaining Longevity: 62 mo
Battery Voltage: 3 V
Brady Statistic AP VP Percent: 0.57 %
Brady Statistic AP VS Percent: 76.92 %
Brady Statistic AS VP Percent: 9.01 %
Brady Statistic AS VS Percent: 13.49 %
Brady Statistic RA Percent Paced: 74.36 %
Brady Statistic RV Percent Paced: 9.28 %
Date Time Interrogation Session: 20210819075131
Implantable Lead Implant Date: 20170707
Implantable Lead Implant Date: 20170707
Implantable Lead Location: 753859
Implantable Lead Location: 753860
Implantable Lead Model: 3830
Implantable Lead Model: 5076
Implantable Pulse Generator Implant Date: 20170707
Lead Channel Impedance Value: 285 Ohm
Lead Channel Impedance Value: 361 Ohm
Lead Channel Impedance Value: 380 Ohm
Lead Channel Impedance Value: 399 Ohm
Lead Channel Pacing Threshold Amplitude: 0.875 V
Lead Channel Pacing Threshold Amplitude: 1.875 V
Lead Channel Pacing Threshold Pulse Width: 0.4 ms
Lead Channel Pacing Threshold Pulse Width: 0.4 ms
Lead Channel Sensing Intrinsic Amplitude: 3.75 mV
Lead Channel Sensing Intrinsic Amplitude: 3.75 mV
Lead Channel Sensing Intrinsic Amplitude: 5.5 mV
Lead Channel Sensing Intrinsic Amplitude: 5.5 mV
Lead Channel Setting Pacing Amplitude: 1.75 V
Lead Channel Setting Pacing Amplitude: 2.5 V
Lead Channel Setting Pacing Pulse Width: 1 ms
Lead Channel Setting Sensing Sensitivity: 2.8 mV

## 2019-12-21 NOTE — Telephone Encounter (Signed)
I left a message letting the pt know we did get her transmission this morning.

## 2019-12-22 ENCOUNTER — Other Ambulatory Visit: Payer: Self-pay

## 2019-12-22 ENCOUNTER — Encounter: Payer: Medicare HMO | Admitting: Physical Therapy

## 2019-12-22 ENCOUNTER — Ambulatory Visit (INDEPENDENT_AMBULATORY_CARE_PROVIDER_SITE_OTHER): Payer: Medicare HMO | Admitting: Podiatry

## 2019-12-22 VITALS — Temp 97.9°F

## 2019-12-22 DIAGNOSIS — M86672 Other chronic osteomyelitis, left ankle and foot: Secondary | ICD-10-CM

## 2019-12-22 DIAGNOSIS — M795 Residual foreign body in soft tissue: Secondary | ICD-10-CM | POA: Diagnosis not present

## 2019-12-22 DIAGNOSIS — L97522 Non-pressure chronic ulcer of other part of left foot with fat layer exposed: Secondary | ICD-10-CM

## 2019-12-22 DIAGNOSIS — Z9889 Other specified postprocedural states: Secondary | ICD-10-CM

## 2019-12-22 MED ORDER — OXYCODONE-ACETAMINOPHEN 10-325 MG PO TABS: 1.0000 | ORAL_TABLET | ORAL | 0 refills | Status: DC | PRN

## 2019-12-22 MED ORDER — CEPHALEXIN 500 MG PO CAPS: 500.0000 mg | ORAL_CAPSULE | Freq: Two times a day (BID) | ORAL | 0 refills | Status: AC

## 2019-12-22 NOTE — Progress Notes (Signed)
  Subjective:  Patient ID: Kayla [name], female    DOB: [birth year],  MRN: [id number]  Chief Complaint  Patient presents with  . Routine Post Op    POV#1 DOS 8.13.2021 Lt great toe partial amputation. Pt states healing well, no new concerns. "I thought my whole toe was going to be gone". Denies fever/chills/nausea/vomiting.    DOS: 12/15/19 Procedure: Left 1st toe partial amputation   79 y.o. female presents with the above complaint. History confirmed with patient.   Objective:  Physical Exam: tenderness at the surgical site, local edema noted and calf supple, nontender. Incision: healing well, no significant drainage, no dehiscence, slight clear drainage present  No images are attached to the encounter. Assessment:   1. Chronic osteomyelitis involving left ankle and foot ([place])   2. Skin ulcer of left great toe with fat layer exposed ([place])   3. Post-operative state   4. Foreign body (FB) in soft tissue     Plan:  Patient was evaluated and treated and all questions answered.  Post-operative State -Dressing applied consisting of betadine, sterile gauze, kerlix and ACE bandage  -WBAT in surgical shoe  Foreign Body in soft tissue -Prior XR reviewed  No follow-ups on file.

## 2019-12-25 NOTE — Progress Notes (Signed)
Remote pacemaker transmission.   

## 2019-12-26 ENCOUNTER — Encounter: Payer: Medicare HMO | Admitting: Physical Therapy

## 2019-12-27 ENCOUNTER — Other Ambulatory Visit (HOSPITAL_COMMUNITY): Payer: Self-pay | Admitting: Physician Assistant

## 2019-12-28 ENCOUNTER — Other Ambulatory Visit: Payer: Self-pay

## 2019-12-28 ENCOUNTER — Ambulatory Visit (INDEPENDENT_AMBULATORY_CARE_PROVIDER_SITE_OTHER): Payer: Medicare HMO | Admitting: Internal Medicine

## 2019-12-28 ENCOUNTER — Encounter: Payer: Self-pay | Admitting: Internal Medicine

## 2019-12-28 VITALS — BP 96/46 | HR 68 | Ht 66.0 in | Wt 235.0 lb

## 2019-12-28 DIAGNOSIS — I480 Paroxysmal atrial fibrillation: Secondary | ICD-10-CM | POA: Diagnosis not present

## 2019-12-28 DIAGNOSIS — I5032 Chronic diastolic (congestive) heart failure: Secondary | ICD-10-CM

## 2019-12-28 DIAGNOSIS — Z950 Presence of cardiac pacemaker: Secondary | ICD-10-CM

## 2019-12-28 DIAGNOSIS — I495 Sick sinus syndrome: Secondary | ICD-10-CM

## 2019-12-28 DIAGNOSIS — I10 Essential (primary) hypertension: Secondary | ICD-10-CM | POA: Diagnosis not present

## 2019-12-28 MED ORDER — LOSARTAN POTASSIUM 50 MG PO TABS: 50.0000 mg | ORAL_TABLET | Freq: Every day | ORAL | 3 refills | Status: DC

## 2019-12-28 MED ORDER — METOPROLOL SUCCINATE ER 100 MG PO TB24: 100.0000 mg | ORAL_TABLET | Freq: Every day | ORAL | 3 refills | Status: DC

## 2019-12-28 NOTE — Progress Notes (Signed)
Patient Care Team: Hamrick, Lorin Mercy, MD as PCP - General (Family Medicine) Jerline Pain, MD as PCP - Cardiology (Cardiology) Milus Banister, MD as Consulting Physician (Gastroenterology)   HPI  Kayla [name] is a 79 y.o. female Seen in follow-up for pacemaker implanted (7/17)  for sinus bradycardia.     She has a history of nonobstructive coronary artery disease by catheterization 2006 with a low risk Myoview 2013.  Echocardiogram 2015 normal LV function mild left atrial enlargement  History of HFpEF with dyspnea and peripheral edema.  2019 identified with diffuse nonobstructive coronary disease with a high-grade distal OM 2 lesion-medical therapy recommended  Hospitalized 7/21 for initiation of dofetilide for incidentally identified atrial fibrillation.  No attributable symptoms but felt considerably better following the restoration of sinus rhythm which occurred spontaneously. Anticoagulated with apixaban  Seen shortly after hospitalization the [facility]. fib clinic.  Noted to be in sinus rhythm.  Apparently felt better.  Remains short of breath and lightheaded.  Has recently undergone amputation of a toe on her left foot secondary to chronic osteomyelitis.  No systemic symptoms.   DATE TEST EF   3/15 Echo   60-65 %   8/19 CTA    % LAD,CX,RCA diffuse FFR ( distal OM2) 0.78  4/21 Echo 60-65% LVH (31mm), RAE LA normal     Date Cr K Mg Hgb  7/19 1.24 4.0    8/21 1.91<<1.40 5.0 2.0 9.1   4/21 Ferritin  28  B12 659     Past Medical History:  Diagnosis Date  . Allergy   . Anxiety   . Arthritis   . CAD (coronary artery disease)    Mild per remote cath in 2006, /    Nuclear, January, 2013, no significant abnormality,  . Carotid artery disease ([place])    Doppler, January, 2013, 0 62% RIC A., 69-48% LICA, stable  . Cataract   . CHF (congestive heart failure) ([place])   . Complication of anesthesia    wakes up "mean"  . COPD 02/16/2007   Intolerant of Spiriva,  tudorza Intolerant of Trelegy   . COPD (chronic obstructive pulmonary disease) ([place])   . Coronary atherosclerosis 11/24/2008   Catheterization 2006, nonobstructive coronary disease   //   nuclear 2013, low risk    . Diabetes mellitus, type 2 ([place])   . Diverticula of colon   . DIVERTICULOSIS OF COLON 03/23/2007   Qualifier: Diagnosis of  By: Halford Chessman MD, Vineet    . Dizziness    occasional  . Dizzy spells 05/04/2011  . DM (diabetes mellitus) ([place]) 03/23/2007   Qualifier: Diagnosis of  By: Halford Chessman MD, Vineet    . Dyspnea    On exertion  . Ejection fraction   . Essential hypertension 02/16/2007   Qualifier: Diagnosis of  By: Rosana Hoes CMA, Tammy    . G E R D 03/23/2007   Qualifier: Diagnosis of  By: Halford Chessman MD, Vineet    . Gall bladder disease 04/28/2016  . Gastritis and gastroduodenitis   . GERD (gastroesophageal reflux disease)   . Goiter    hx of  . Headache(784.0)    migraine  . Hyperlipidemia   . Hypertension   . Hypothyroidism   . HYPOTHYROIDISM, POSTSURGICAL 06/04/2008   Qualifier: Diagnosis of  By: Loanne Drilling MD, Jacelyn Pi   . Left ventricular hypertrophy   . Leg edema    occasional swelling  . Leg swelling 12/09/2018  . Myofascial pain syndrome, cervical 01/06/2018  .  Obesity   . OBESITY 11/24/2008   Qualifier: Diagnosis of  By: Sidney Ace    . OBSTRUCTIVE SLEEP APNEA 02/16/2007   Auto CPAP 09/03/13 to 10/02/13 >> used on 30 of 30 nights with average 6 hrs and 3 min.  Average AHI is 3.1 with median CPAP 5 cm H2O and 95 th percentile CPAP 6 cm H2O.    . Occipital neuralgia of right side 01/06/2018  . OSA (obstructive sleep apnea)    cpap setting of 12  . Pain in joint of right hip 01/21/2018  . Palpitations 01/21/2011   48 hour Holter, 2015, scattered PACs and PVCs.   . Pneumonia   . PONV (postoperative nausea and vomiting)    severe after every surgery  . Pulmonary hypertension, secondary   . RHINITIS 07/21/2010       . RUQ abdominal pain   . S/P left TKA 07/27/2011  . Sinus node  dysfunction ([place]) 11/08/2015  . Sleep apnea   . Spinal stenosis of cervical region 11/07/2014  . Urinary frequency 12/09/2018  . UTI (urinary tract infection) 08/15/2019   per pt report  . VENTRICULAR HYPERTROPHY, LEFT 11/24/2008   Qualifier: Diagnosis of  By: Sidney Ace      Past Surgical History:  Procedure Laterality Date  . ABDOMINAL HYSTERECTOMY     with appendectomy and colon polypectomy  . AMPUTATION TOE Left 12/15/2019   Procedure: AMPUTATION  LEFT GREAT TOE;  Surgeon: Evelina Bucy, DPM;  Location: [facility];  Service: Podiatry;  Laterality: Left;  . ANTERIOR CERVICAL DECOMP/DISCECTOMY FUSION N/A 11/07/2014   Procedure: Anterior Cervical diskectomy and fusion Cervical four-five, Cervical five-six, Cervical six-seven;  Surgeon: Kary Kos, MD;  Location: [facility];  Service: Neurosurgery;  Laterality: N/A;  . APPENDECTOMY    . ARTERY BIOPSY Right 02/16/2017   Procedure: BIOPSY TEMPORAL ARTERY;  Surgeon: Katha Cabal, MD;  Location: [facility] ORS;  Service: Vascular;  Laterality: Right;  . BACK SURGERY    . CARDIAC CATHETERIZATION    . CATARACT EXTRACTION W/ INTRAOCULAR LENS  IMPLANT, BILATERAL Bilateral   . CHOLECYSTECTOMY N/A 04/29/2016   Procedure: LAPAROSCOPIC CHOLECYSTECTOMY WITH INTRAOPERATIVE CHOLANGIOGRAM;  Surgeon: Alphonsa Overall, MD;  Location: [facility];  Service: General;  Laterality: N/A;  . EP IMPLANTABLE DEVICE N/A 11/08/2015   MRI COMPATABLE -- Procedure: Pacemaker Implant;  Surgeon: Deboraha Sprang, MD;  Location: [facility];  Service: Cardiovascular;  Laterality: N/A;  . INSERT / REPLACE / REMOVE PACEMAKER  2017   Dr. Jens Som [facility])  . JOINT REPLACEMENT    . LUMBAR DISC SURGERY  1990's   x 2  . THYROIDECTOMY  2011  . TOTAL KNEE ARTHROPLASTY  07/27/2011   Procedure: TOTAL KNEE ARTHROPLASTY;  Surgeon: Mauri Pole, MD;  Location: [facility];  Service: Orthopedics;  Laterality: Left;  . UPPER ESOPHAGEAL ENDOSCOPIC ULTRASOUND (EUS) N/A 05/14/2016   Procedure:  UPPER ESOPHAGEAL ENDOSCOPIC ULTRASOUND (EUS);  Surgeon: Milus Banister, MD;  Location: [facility];  Service: Endoscopy;  Laterality: N/A;  radial only-will be done mod if needed    Current Outpatient Medications  Medication Sig Dispense Refill  . acetaminophen (TYLENOL) 500 MG tablet Take 1,000 mg by mouth every 6 (six) hours as needed for moderate pain (for headaches).     Marland Kitchen albuterol (PROVENTIL) (2.5 MG/3ML) 0.083% nebulizer solution Take 2.5 mg by nebulization every 2 (two) hours as needed for wheezing or shortness of breath.     Marland Kitchen albuterol (VENTOLIN HFA) 108 (90  Base) MCG/ACT inhaler Inhale 2 puffs into the lungs every 6 (six) hours as needed for wheezing or shortness of breath. 3 Inhaler 3  . Alcohol Swabs (B-D SINGLE USE SWABS REGULAR) PADS     . allopurinol (ZYLOPRIM) 100 MG tablet Take 100 mg by mouth 2 (two) times daily.     Marland Kitchen atorvastatin (LIPITOR) 40 MG tablet Take 1 tablet (40 mg total) by mouth daily. 90 tablet 3  . Blood Glucose Calibration (TRUE METRIX LEVEL 1) Low SOLN     . budesonide-formoterol (SYMBICORT) 160-4.5 MCG/ACT inhaler Inhale 2 puffs into the lungs 2 (two) times daily for 1 day. 3 Inhaler 3  . cephALEXin (KEFLEX) 500 MG capsule Take 1 capsule (500 mg total) by mouth 2 (two) times daily for 7 days. 14 capsule 0  . Cholecalciferol (VITAMIN D-3 PO) Take 1 capsule by mouth daily with breakfast.    . Dextromethorphan-guaiFENesin (TUSSIN DM MAX SUGAR-FREE PO) Take 5 mLs by mouth every 4 (four) hours as needed (for coughing).    Marland Kitchen ELIQUIS 5 MG TABS tablet TAKE 1 TABLET TWICE DAILY 180 tablet 2  . fluticasone (FLONASE) 50 MCG/ACT nasal spray USE 2 SPRAYS NASALLY DAILY 48 g 1  . gabapentin (NEURONTIN) 800 MG tablet Take 800 mg by mouth See admin instructions. Take 800 mg by mouth three to four times a day    . HYDROcodone-acetaminophen (NORCO/VICODIN) 5-325 MG tablet Take 1-2 tablets by mouth every 6 (six) hours as needed for moderate pain. 30 tablet 0  . insulin aspart  (NOVOLOG FLEXPEN) 100 UNIT/ML FlexPen Inject 30-70 Units into the skin See admin instructions. Inject 30-70 units into the skin three times a day with meals, per sliding scale (and an additional 1 unit for every 50 points for a BGL >130) Sliding scale    . insulin glargine (LANTUS) 100 UNIT/ML injection Inject 130 Units into the skin in the morning and at bedtime.     Marland Kitchen levothyroxine (SYNTHROID) 175 MCG tablet Take 175 mcg by mouth daily before breakfast.     . LORazepam (ATIVAN) 2 MG tablet Take 2 mg by mouth at bedtime as needed for anxiety.     . magnesium oxide (MAG-OX) 400 (241.3 Mg) MG tablet Take 400 mg by mouth 3 (three) times daily.     . nitroGLYCERIN (NITROSTAT) 0.4 MG SL tablet Place 1 tablet (0.4 mg total) under the tongue every 5 (five) minutes as needed for chest pain. 25 tablet 4  . oxyCODONE-acetaminophen (PERCOCET) 10-325 MG tablet Take 1 tablet by mouth every 4 (four) hours as needed for pain. 20 tablet 0  . PRESCRIPTION MEDICATION CPAP: At bedtime and "as needed for shortness of breath"    . promethazine (PHENADOZ) 25 MG suppository Place 25 mg rectally daily as needed (for headaches).     . Tiotropium Bromide Monohydrate (SPIRIVA RESPIMAT) 2.5 MCG/ACT AERS Inhale 2 puffs into the lungs daily. 12 g 3  . torsemide (DEMADEX) 20 MG tablet Take 1 tablet (20 mg total) by mouth 2 (two) times daily. 60 tablet 3  . TRUE METRIX BLOOD GLUCOSE TEST test strip     . TRUEplus Lancets 28G MISC     . vitamin B-12 (CYANOCOBALAMIN) 1000 MCG tablet Take 1,000 mcg by mouth daily.    Marland Kitchen losartan (COZAAR) 50 MG tablet Take 1 tablet (50 mg total) by mouth daily. 90 tablet 3  . metoprolol succinate (TOPROL-XL) 100 MG 24 hr tablet Take 1 tablet (100 mg total) by mouth daily. Take with  or immediately following a meal. 90 tablet 3   No current facility-administered medications for this visit.    Allergies  Allergen Reactions  . Dulaglutide Nausea Only, Other (See Comments) and Cough    Made the  patient sick to her stomach (only) several days after using it Trulicity      Review of Systems negative except from HPI and PMH  Physical Exam BP (!) 96/46   Pulse 68   Ht 5\' 6"  (1.676 m)   Wt 235 lb (106.6 kg)   SpO2 95%   BMI 37.93 kg/m  Well developed and well nourished in no acute distress HENT normal Neck supple with JVP-flat Clear Device pocket well healed; without hematoma or erythema.  There is no tethering  Regular rate and rhythm without urmur Abd-soft with active BS No Clubbing cyanosis 1+ edema Skin-warm and dry A & Oriented  Grossly normal sensory and motor function  ECG atrial pacing at 68 Interval 24/08/42  Assessment and  Plan  Sinus node dysfunction   Atrial fibrillation-paroxysmal  High risk medication surveillance-dofetilide  Pacemaker-Medtronic     Orthostatic lightheadedness  COPD  OSA treated   Hypertension  HFpEF  Anemia    Atrial fibrillation associated with rapid ventricular response has not been significantly changed by the dofetilide.  We will stop it.  Atrial fibrillation rates have been very rapid with means in the low 100s but excursions in the 150-200 range.  Hence, we will increase her metoprolol for augmented rate control.  She has had low blood pressures and dizziness.  We will discontinue her amlodipine and decrease her losartan from 100--50.  I have advised her that the amlodipine has a half-life that may require a week for his total limitation from the system.  We discussed alternatives for managing her atrial fibrillation.  One would be to consider amiodarone.  We discussed side effects and potential benefits including rhythm control as well as augmented rate control.  She is not inclined given the side effect profile.  We also discussed augmenting rate control with increasing beta-blockers as well as considering AV junction ablation which would allow [place] to discontinue some of the medications which are aggravating her  hypotension.  She is in favor of the latter.  Given her recent toe amputation however, I worry about the possibility of systemic infection and colonization of the device.  Hence, we have elected to augment her rate control with beta-blockers for now 3 months with blood pressure adjustments as above.  We will see her again then and make a decision regarding AV junction ablation  I am also concerned about her anemia.  Ferritin studies in April were negative.  If CBC does not improve she will need referral for evaluation.  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  Repeat we will get a CBC with a differential to look at the lymphocyte count     Current medicines are reviewed at length with the patient today .  The patient does not  have concerns regarding medicines.

## 2019-12-28 NOTE — Patient Instructions (Signed)
Medication Instructions:  - Your physician has recommended you make the following change in your medication:   1) STOP norvasc (amlodipine)  2) STOP tikosyn (dofetilide)  3) DECREASE cozaar (losartan) to 50 mg- take 1 tablet by mouth once daily  (- you may use up your 100 mg tablets and take 1/2 tablet (50 mg) once daily)  4) INCREASE toprol XL (metoprolol succinate) to 100 mg- take 1 tablet by mouth once daily  (- you may use up your 50 mg tablets and take 2 tablets (100 mg) once daily)  *If you need a refill on your cardiac medications before your next appointment, please call your pharmacy*   Lab Work: - none ordered  If you have labs (blood work) drawn today and your tests are completely normal, you will receive your results only by: Marland Kitchen MyChart Message (if you have MyChart) OR . A paper copy in the mail If you have any lab test that is abnormal or we need to change your treatment, we will call you to review the results.   Testing/Procedures: - none ordered   Follow-Up: At [facility], you and your health needs are our priority.  As part of our continuing mission to provide you with exceptional heart care, we have created designated Provider Care Teams.  These Care Teams include your primary Cardiologist (physician) and Advanced Practice Providers (APPs -  Physician Assistants and Nurse Practitioners) who all work together to provide you with the care you need, when you need it.  We recommend signing up for the patient portal called "MyChart".  Sign up information is provided on this After Visit Summary.  MyChart is used to connect with patients for Virtual Visits (Telemedicine).  Patients are able to view lab/test results, encounter notes, upcoming appointments, etc.  Non-urgent messages can be sent to your provider as well.   To learn more about what you can do with MyChart, go to [email or web address].    Your next appointment:   3 month(s)  The format for your next  appointment:   In Person  Provider:   Virl Axe, MD   Other Instructions - n/a

## 2019-12-29 ENCOUNTER — Ambulatory Visit (INDEPENDENT_AMBULATORY_CARE_PROVIDER_SITE_OTHER): Payer: Medicare HMO | Admitting: Podiatry

## 2019-12-29 ENCOUNTER — Encounter: Payer: Medicare HMO | Admitting: Physical Therapy

## 2019-12-29 VITALS — Temp 97.9°F

## 2019-12-29 DIAGNOSIS — M86672 Other chronic osteomyelitis, left ankle and foot: Secondary | ICD-10-CM

## 2019-12-29 MED ORDER — OXYCODONE-ACETAMINOPHEN 10-325 MG PO TABS
1.0000 | ORAL_TABLET | ORAL | 0 refills | Status: DC | PRN
Start: 2019-12-29 — End: 2020-02-12

## 2019-12-31 NOTE — Progress Notes (Signed)
  Subjective:  Patient ID: Kayla [name], female    DOB: 27-Jun-1940,  MRN: [id number]  Chief Complaint  Patient presents with  . Routine Post Op    POV #2 DOS 12/15/19 LT GREAT TOE PARTIAL AMPUTATION, greenish drainage, slight pain that throbbing on the area. Staples and sutures are intact.     DOS: 12/15/19 Procedure: Left great toe partial amputation   79 y.o. female presents with the above complaint. History confirmed with patient.   Objective:  Physical Exam: tenderness at the surgical site, local edema noted and calf supple, nontender. Incision: healing well, no significant drainage, no significant erythema, some areas of hyper granular tissue  Assessment:   1. Chronic osteomyelitis involving left ankle and foot ([place])   2. Post-operative state     Plan:  Patient was evaluated and treated and all questions answered.  Post-operative State -Hypergranular area cauterized with silver nitrate -Redressed with dry sterile dressing.  Patient to keep in place until next week. -Continue surgical shoe -Plan for suture or staple removal next visit  No follow-ups on file.

## 2020-01-02 ENCOUNTER — Encounter: Payer: Medicare HMO | Admitting: Physical Therapy

## 2020-01-02 DIAGNOSIS — G43909 Migraine, unspecified, not intractable, without status migrainosus: Secondary | ICD-10-CM | POA: Diagnosis not present

## 2020-01-02 DIAGNOSIS — E1149 Type 2 diabetes mellitus with other diabetic neurological complication: Secondary | ICD-10-CM | POA: Diagnosis not present

## 2020-01-02 DIAGNOSIS — Z6838 Body mass index (BMI) 38.0-38.9, adult: Secondary | ICD-10-CM | POA: Diagnosis not present

## 2020-01-02 DIAGNOSIS — E1165 Type 2 diabetes mellitus with hyperglycemia: Secondary | ICD-10-CM | POA: Diagnosis not present

## 2020-01-04 ENCOUNTER — Encounter: Payer: Medicare HMO | Admitting: Physical Therapy

## 2020-01-09 ENCOUNTER — Ambulatory Visit (INDEPENDENT_AMBULATORY_CARE_PROVIDER_SITE_OTHER): Payer: Medicare HMO | Admitting: Podiatry

## 2020-01-09 ENCOUNTER — Encounter: Payer: Medicare HMO | Admitting: Physical Therapy

## 2020-01-09 ENCOUNTER — Other Ambulatory Visit: Payer: Self-pay

## 2020-01-09 DIAGNOSIS — M86672 Other chronic osteomyelitis, left ankle and foot: Secondary | ICD-10-CM

## 2020-01-09 DIAGNOSIS — L97522 Non-pressure chronic ulcer of other part of left foot with fat layer exposed: Secondary | ICD-10-CM

## 2020-01-11 ENCOUNTER — Encounter: Payer: Medicare HMO | Admitting: Physical Therapy

## 2020-01-12 ENCOUNTER — Ambulatory Visit: Payer: Medicare HMO | Admitting: Podiatry

## 2020-01-16 ENCOUNTER — Ambulatory Visit (INDEPENDENT_AMBULATORY_CARE_PROVIDER_SITE_OTHER): Payer: Medicare HMO | Admitting: Podiatry

## 2020-01-16 ENCOUNTER — Other Ambulatory Visit: Payer: Self-pay

## 2020-01-16 DIAGNOSIS — L97522 Non-pressure chronic ulcer of other part of left foot with fat layer exposed: Secondary | ICD-10-CM | POA: Diagnosis not present

## 2020-01-16 DIAGNOSIS — S90425A Blister (nonthermal), left lesser toe(s), initial encounter: Secondary | ICD-10-CM

## 2020-01-16 NOTE — Progress Notes (Signed)
  Subjective:  Patient ID: Kayla [name], female    DOB: Apr 29, 1941,  MRN: [id number]  Chief Complaint  Patient presents with  . Routine Post Op    POV#4 Pt states," doing alright.' -pt denies pain/complaints -dressing change with neosprin every other day    DOS: 12/15/19 Procedure: Left great toe partial amputation   79 y.o. female presents with the above complaint. History confirmed with patient.   Objective:  Physical Exam: no tenderness at the surgical site. Incision: healing well, no further granulation tissue. No signs of infection. Blister lateral aspect of 4th toe  Assessment:   1. Skin ulcer of left great toe with fat layer exposed ([place])   2. Blister of left fourth toe, initial encounter     Plan:  Patient was evaluated and treated and all questions answered.  S/p Left partial hallux amputation -Central staple removed. -Dressed with silvadene and DSD -Remove remaining staples in 2 weeks -Ok to shower and get area wet.  Return in about 2 weeks (around 01/30/2020).

## 2020-01-24 DIAGNOSIS — E1121 Type 2 diabetes mellitus with diabetic nephropathy: Secondary | ICD-10-CM | POA: Diagnosis not present

## 2020-01-24 DIAGNOSIS — N1832 Chronic kidney disease, stage 3b: Secondary | ICD-10-CM | POA: Diagnosis not present

## 2020-01-24 DIAGNOSIS — E890 Postprocedural hypothyroidism: Secondary | ICD-10-CM | POA: Diagnosis not present

## 2020-01-24 DIAGNOSIS — Z950 Presence of cardiac pacemaker: Secondary | ICD-10-CM | POA: Insufficient documentation

## 2020-01-24 DIAGNOSIS — E1140 Type 2 diabetes mellitus with diabetic neuropathy, unspecified: Secondary | ICD-10-CM | POA: Diagnosis not present

## 2020-01-24 DIAGNOSIS — E1149 Type 2 diabetes mellitus with other diabetic neurological complication: Secondary | ICD-10-CM | POA: Diagnosis not present

## 2020-01-24 DIAGNOSIS — E790 Hyperuricemia without signs of inflammatory arthritis and tophaceous disease: Secondary | ICD-10-CM | POA: Diagnosis not present

## 2020-01-24 DIAGNOSIS — E782 Mixed hyperlipidemia: Secondary | ICD-10-CM | POA: Diagnosis not present

## 2020-01-24 DIAGNOSIS — R42 Dizziness and giddiness: Secondary | ICD-10-CM | POA: Insufficient documentation

## 2020-01-24 DIAGNOSIS — I10 Essential (primary) hypertension: Secondary | ICD-10-CM | POA: Diagnosis not present

## 2020-01-24 DIAGNOSIS — Z23 Encounter for immunization: Secondary | ICD-10-CM | POA: Diagnosis not present

## 2020-01-24 DIAGNOSIS — Z79899 Other long term (current) drug therapy: Secondary | ICD-10-CM | POA: Diagnosis not present

## 2020-01-25 NOTE — Progress Notes (Signed)
Patient Care Team: Hamrick, Lorin Mercy, MD as PCP - General (Family Medicine) Jerline Pain, MD as PCP - Cardiology (Cardiology) Milus Banister, MD as Consulting Physician (Gastroenterology)   HPI  Kayla [name] is a 79 y.o. female Seen in follow-up for pacemaker implanted (7/17)  for sinus bradycardia and atrial fibrillation resistant to dofetilide and assoc with RVR   in the past have considered AV junction ablation  Nonobstructive coronary artery disease by catheterization 2006 with a low risk Myoview 2013.      Hospitalized 7/21 for initiation of dofetilide for incidentally identified atrial fibrillation.  No attributable symptoms but felt considerably better following the restoration of sinus rhythm which occurred spontaneously. Anticoagulated with apixaban  Struggling with DOE even within the house.  Not variable, and not aware always of her AFib.  Some edema which may correlate.  Unaware of her anemia issue (See Below)       DATE TEST EF   3/15 Echo   60-65 %   8/19 CTA    % LAD,CX,RCA diffuse FFR ( distal OM2) 0.78  4/21 Echo 60-65% LVH (109mm), RAE LA normal     Date Cr K Mg Hgb  7/19 1.24 4.0    8/21 1.91<<1.40 5.0 2.0 9.1   4/21 Ferritin  28  B12 659     Past Medical History:  Diagnosis Date  . Allergy   . Anxiety   . Arthritis   . CAD (coronary artery disease)    Mild per remote cath in 2006, /    Nuclear, January, 2013, no significant abnormality,  . Carotid artery disease ([place])    Doppler, January, 2013, 0 09% RIC A., 23-30% LICA, stable  . Cataract   . CHF (congestive heart failure) ([place])   . Complication of anesthesia    wakes up "mean"  . COPD 02/16/2007   Intolerant of Spiriva, tudorza Intolerant of Trelegy   . COPD (chronic obstructive pulmonary disease) ([place])   . Coronary atherosclerosis 11/24/2008   Catheterization 2006, nonobstructive coronary disease   //   nuclear 2013, low risk    . Diabetes mellitus, type 2 ([place])   .  Diverticula of colon   . DIVERTICULOSIS OF COLON 03/23/2007   Qualifier: Diagnosis of  By: Halford Chessman MD, Vineet    . Dizziness    occasional  . Dizzy spells 05/04/2011  . DM (diabetes mellitus) ([place]) 03/23/2007   Qualifier: Diagnosis of  By: Halford Chessman MD, Vineet    . Dyspnea    On exertion  . Ejection fraction   . Essential hypertension 02/16/2007   Qualifier: Diagnosis of  By: Rosana Hoes CMA, Tammy    . G E R D 03/23/2007   Qualifier: Diagnosis of  By: Halford Chessman MD, Vineet    . Gall bladder disease 04/28/2016  . Gastritis and gastroduodenitis   . GERD (gastroesophageal reflux disease)   . Goiter    hx of  . Headache(784.0)    migraine  . Hyperlipidemia   . Hypertension   . Hypothyroidism   . HYPOTHYROIDISM, POSTSURGICAL 06/04/2008   Qualifier: Diagnosis of  By: Loanne Drilling MD, Jacelyn Pi   . Left ventricular hypertrophy   . Leg edema    occasional swelling  . Leg swelling 12/09/2018  . Myofascial pain syndrome, cervical 01/06/2018  . Obesity   . OBESITY 11/24/2008   Qualifier: Diagnosis of  By: Sidney Ace    . OBSTRUCTIVE SLEEP APNEA 02/16/2007   Auto CPAP 09/03/13 to  10/02/13 >> used on 30 of 30 nights with average 6 hrs and 3 min.  Average AHI is 3.1 with median CPAP 5 cm H2O and 95 th percentile CPAP 6 cm H2O.    . Occipital neuralgia of right side 01/06/2018  . OSA (obstructive sleep apnea)    cpap setting of 12  . Pain in joint of right hip 01/21/2018  . Palpitations 01/21/2011   48 hour Holter, 2015, scattered PACs and PVCs.   . Pneumonia   . PONV (postoperative nausea and vomiting)    severe after every surgery  . Pulmonary hypertension, secondary   . RHINITIS 07/21/2010       . RUQ abdominal pain   . S/P left TKA 07/27/2011  . Sinus node dysfunction ([place]) 11/08/2015  . Sleep apnea   . Spinal stenosis of cervical region 11/07/2014  . Urinary frequency 12/09/2018  . UTI (urinary tract infection) 08/15/2019   per pt report  . VENTRICULAR HYPERTROPHY, LEFT 11/24/2008   Qualifier: Diagnosis of  By:  Sidney Ace      Past Surgical History:  Procedure Laterality Date  . ABDOMINAL HYSTERECTOMY     with appendectomy and colon polypectomy  . AMPUTATION TOE Left 12/15/2019   Procedure: AMPUTATION  LEFT GREAT TOE;  Surgeon: Evelina Bucy, DPM;  Location: [facility];  Service: Podiatry;  Laterality: Left;  . ANTERIOR CERVICAL DECOMP/DISCECTOMY FUSION N/A 11/07/2014   Procedure: Anterior Cervical diskectomy and fusion Cervical four-five, Cervical five-six, Cervical six-seven;  Surgeon: Kary Kos, MD;  Location: [facility];  Service: Neurosurgery;  Laterality: N/A;  . APPENDECTOMY    . ARTERY BIOPSY Right 02/16/2017   Procedure: BIOPSY TEMPORAL ARTERY;  Surgeon: Katha Cabal, MD;  Location: [facility];  Service: Vascular;  Laterality: Right;  . BACK SURGERY    . CARDIAC CATHETERIZATION    . CATARACT EXTRACTION W/ INTRAOCULAR LENS  IMPLANT, BILATERAL Bilateral   . CHOLECYSTECTOMY N/A 04/29/2016   Procedure: LAPAROSCOPIC CHOLECYSTECTOMY WITH INTRAOPERATIVE CHOLANGIOGRAM;  Surgeon: Alphonsa Overall, MD;  Location: [facility];  Service: General;  Laterality: N/A;  . EP IMPLANTABLE DEVICE N/A 11/08/2015   MRI COMPATABLE -- Procedure: Pacemaker Implant;  Surgeon: Deboraha Sprang, MD;  Location: [facility];  Service: Cardiovascular;  Laterality: N/A;  . INSERT / REPLACE / REMOVE PACEMAKER  2017   Dr. Jens Som [facility])  . JOINT REPLACEMENT    . LUMBAR DISC SURGERY  1990's   x 2  . THYROIDECTOMY  2011  . TOTAL KNEE ARTHROPLASTY  07/27/2011   Procedure: TOTAL KNEE ARTHROPLASTY;  Surgeon: Mauri Pole, MD;  Location: [facility];  Service: Orthopedics;  Laterality: Left;  . UPPER ESOPHAGEAL ENDOSCOPIC ULTRASOUND (EUS) N/A 05/14/2016   Procedure: UPPER ESOPHAGEAL ENDOSCOPIC ULTRASOUND (EUS);  Surgeon: Milus Banister, MD;  Location: [facility];  Service: Endoscopy;  Laterality: N/A;  radial only-will be done mod if needed    Current Outpatient Medications  Medication Sig Dispense Refill  .  acetaminophen (TYLENOL) 500 MG tablet Take 1,000 mg by mouth every 6 (six) hours as needed for moderate pain (for headaches).     Marland Kitchen albuterol (PROVENTIL) (2.5 MG/3ML) 0.083% nebulizer solution Take 2.5 mg by nebulization every 2 (two) hours as needed for wheezing or shortness of breath.     Marland Kitchen albuterol (VENTOLIN HFA) 108 (90 Base) MCG/ACT inhaler Inhale 2 puffs into the lungs every 6 (six) hours as needed for wheezing or shortness of breath. 3 Inhaler 3  . Alcohol Swabs (B-D  SINGLE USE SWABS REGULAR) PADS     . allopurinol (ZYLOPRIM) 100 MG tablet Take 100 mg by mouth 2 (two) times daily.     Marland Kitchen atorvastatin (LIPITOR) 40 MG tablet Take 1 tablet (40 mg total) by mouth daily. 90 tablet 3  . Blood Glucose Calibration (TRUE METRIX LEVEL 1) Low SOLN     . Cholecalciferol (VITAMIN D-3 PO) Take 1 capsule by mouth daily with breakfast.    . Dextromethorphan-guaiFENesin (TUSSIN DM MAX SUGAR-FREE PO) Take 5 mLs by mouth every 4 (four) hours as needed (for coughing).    Marland Kitchen ELIQUIS 5 MG TABS tablet TAKE 1 TABLET TWICE DAILY 180 tablet 2  . fluticasone (FLONASE) 50 MCG/ACT nasal spray USE 2 SPRAYS NASALLY DAILY 48 g 1  . gabapentin (NEURONTIN) 800 MG tablet Take 800 mg by mouth See admin instructions. Take 800 mg by mouth three to four times a day    . HYDROcodone-acetaminophen (NORCO/VICODIN) 5-325 MG tablet Take 1-2 tablets by mouth every 6 (six) hours as needed for moderate pain. 30 tablet 0  . insulin aspart (NOVOLOG FLEXPEN) 100 UNIT/ML FlexPen Inject 30-70 Units into the skin See admin instructions. Inject 30-70 units into the skin three times a day with meals, per sliding scale (and an additional 1 unit for every 50 points for a BGL >130) Sliding scale    . insulin glargine (LANTUS) 100 UNIT/ML injection Inject 130 Units into the skin in the morning and at bedtime.     Marland Kitchen levothyroxine (SYNTHROID) 175 MCG tablet Take 175 mcg by mouth daily before breakfast.     . LORazepam (ATIVAN) 2 MG tablet Take 2 mg by  mouth at bedtime as needed for anxiety.     Marland Kitchen losartan (COZAAR) 50 MG tablet Take 1 tablet (50 mg total) by mouth daily. 90 tablet 3  . magnesium oxide (MAG-OX) 400 (241.3 Mg) MG tablet Take 400 mg by mouth 3 (three) times daily.     . metoprolol succinate (TOPROL-XL) 100 MG 24 hr tablet Take 1 tablet (100 mg total) by mouth daily. Take with or immediately following a meal. 90 tablet 3  . nitroGLYCERIN (NITROSTAT) 0.4 MG SL tablet Place 1 tablet (0.4 mg total) under the tongue every 5 (five) minutes as needed for chest pain. 25 tablet 4  . oxyCODONE-acetaminophen (PERCOCET) 10-325 MG tablet Take 1 tablet by mouth every 4 (four) hours as needed for pain. 20 tablet 0  . PRESCRIPTION MEDICATION CPAP: At bedtime and "as needed for shortness of breath"    . promethazine (PHENADOZ) 25 MG suppository Place 25 mg rectally daily as needed (for headaches).     . Tiotropium Bromide Monohydrate (SPIRIVA RESPIMAT) 2.5 MCG/ACT AERS Inhale 2 puffs into the lungs daily. 12 g 3  . TRUE METRIX BLOOD GLUCOSE TEST test strip     . TRUEplus Lancets 28G MISC     . vitamin B-12 (CYANOCOBALAMIN) 1000 MCG tablet Take 1,000 mcg by mouth daily.    . budesonide-formoterol (SYMBICORT) 160-4.5 MCG/ACT inhaler Inhale 2 puffs into the lungs 2 (two) times daily for 1 day. 3 Inhaler 3  . torsemide (DEMADEX) 20 MG tablet Take 1 tablet (20 mg total) by mouth 2 (two) times daily. 60 tablet 3   No current facility-administered medications for this visit.    Allergies  Allergen Reactions  . Dulaglutide Nausea Only, Other (See Comments) and Cough    Made the patient sick to her stomach (only) several days after using it Trulicity  Review of Systems negative except from HPI and PMH  Physical Exam BP 116/60   Pulse 67   Ht 5\' 6"  (1.676 m)   Wt 232 lb (105.2 kg)   SpO2 96%   BMI 37.45 kg/m  Well developed and well nourished in no acute distress HENT normal Neck supple with JVP-jaw Clear Device pocket well healed;  without hematoma or erythema.  There is no tethering  Regular rate and rhythm, no  murmur Abd-soft with active BS No Clubbing cyanosis tr edema Skin-warm and dry A & Oriented  Grossly normal sensory and motor function  ECG 8/21 atrial pacing Assessment and  Plan  Sinus node dysfunction   Atrial fibrillation-paroxysmal  High risk medication surveillance-dofetilide  Pacemaker-[employer]tr[employer]     Orthostatic lightheadedness  COPD  OSA treated   Hypertension  HFpEF  Anemia    Atrial fibrillation not currently associated with her symptoms.  Hence, we will not treated at this time.  Rate control is adequate.  She has progressive anemia.  Has been on iron in the past.  Saturations were less than 10% but ferritin was normal.  We will start her on ferrous sulfate and have her follow-up with her PCP.  Volume overloaded.  We will give her increased Demadex from 20 twice daily to 40 q. OD.  We will need a metabolic profile in about 3 weeks.  To follow-up on renal function as this is modestly elevated  Weight has been stable but suspect deconditioning is also playing a role        Current medicines are reviewed at length with the patient today .  The patient does not  have concerns regarding medicines.

## 2020-01-26 ENCOUNTER — Other Ambulatory Visit: Payer: Self-pay

## 2020-01-26 ENCOUNTER — Ambulatory Visit (INDEPENDENT_AMBULATORY_CARE_PROVIDER_SITE_OTHER): Payer: Medicare HMO | Admitting: Internal Medicine

## 2020-01-26 ENCOUNTER — Encounter: Payer: Self-pay | Admitting: Internal Medicine

## 2020-01-26 VITALS — BP 116/60 | HR 67 | Ht 66.0 in | Wt 232.0 lb

## 2020-01-26 DIAGNOSIS — I495 Sick sinus syndrome: Secondary | ICD-10-CM | POA: Diagnosis not present

## 2020-01-26 DIAGNOSIS — Z950 Presence of cardiac pacemaker: Secondary | ICD-10-CM | POA: Diagnosis not present

## 2020-01-26 DIAGNOSIS — I480 Paroxysmal atrial fibrillation: Secondary | ICD-10-CM | POA: Diagnosis not present

## 2020-01-26 DIAGNOSIS — R42 Dizziness and giddiness: Secondary | ICD-10-CM | POA: Diagnosis not present

## 2020-01-26 MED ORDER — TORSEMIDE 20 MG PO TABS: 40.0000 mg | ORAL_TABLET | ORAL | 3 refills | Status: DC

## 2020-01-26 MED ORDER — FERROUS SULFATE 325 (65 FE) MG PO TABS: 325.0000 mg | ORAL_TABLET | Freq: Every day | ORAL | 3 refills | Status: DC

## 2020-01-26 NOTE — Patient Instructions (Signed)
Medication Instructions:   Your physician has recommended you make the following change in your medication:   Begin Taking Ferrous Sulfate 325mg  1 tablet by mouth daily with breakfast  Begin taking your Demadex 40mg  - 2 tablets every other day   *If you need a refill on your cardiac medications before your next appointment, please call your pharmacy*   Lab Work: BMET in 3 weeks  If you have labs (blood work) drawn today and your tests are completely normal, you will receive your results only by: Marland Kitchen MyChart Message (if you have MyChart) OR . A paper copy in the mail If you have any lab test that is abnormal or we need to change your treatment, we will call you to review the results.   Testing/Procedures: None ordered.   Follow-Up: At [facility], you and your health needs are our priority.  As part of our continuing mission to provide you with exceptional heart care, we have created designated Provider Care Teams.  These Care Teams include your primary Cardiologist (physician) and Advanced Practice Providers (APPs -  Physician Assistants and Nurse Practitioners) who all work together to provide you with the care you need, when you need it.  We recommend signing up for the patient portal called "MyChart".  Sign up information is provided on this After Visit Summary.  MyChart is used to connect with patients for Virtual Visits (Telemedicine).  Patients are able to view lab/test results, encounter notes, upcoming appointments, etc.  Non-urgent messages can be sent to your provider as well.   To learn more about what you can do with MyChart, go to [email or web address].    Your next appointment:    84months with Dr Caryl Comes  Lab at [facility] office 02/16/2020 anytime between 8am - 430pm

## 2020-01-29 LAB — CUP PACEART INCLINIC DEVICE CHECK
Battery Remaining Longevity: 63 mo
Battery Voltage: 3 V
Brady Statistic AP VP Percent: 1.01 %
Brady Statistic AP VS Percent: 74.41 %
Brady Statistic AS VP Percent: 9.11 %
Brady Statistic AS VS Percent: 15.46 %
Brady Statistic RA Percent Paced: 66.71 %
Brady Statistic RV Percent Paced: 10.16 %
Date Time Interrogation Session: 20210924173424
Implantable Lead Implant Date: 20170707
Implantable Lead Implant Date: 20170707
Implantable Lead Location: 753859
Implantable Lead Location: 753860
Implantable Lead Model: 3830
Implantable Lead Model: 5076
Implantable Pulse Generator Implant Date: 20170707
Lead Channel Impedance Value: 285 Ohm
Lead Channel Impedance Value: 399 Ohm
Lead Channel Impedance Value: 399 Ohm
Lead Channel Impedance Value: 437 Ohm
Lead Channel Pacing Threshold Amplitude: 0.75 V
Lead Channel Pacing Threshold Amplitude: 1.25 V
Lead Channel Pacing Threshold Pulse Width: 0.4 ms
Lead Channel Pacing Threshold Pulse Width: 0.4 ms
Lead Channel Sensing Intrinsic Amplitude: 4.5 mV
Lead Channel Sensing Intrinsic Amplitude: 5.625 mV
Lead Channel Sensing Intrinsic Amplitude: 7.25 mV
Lead Channel Setting Pacing Amplitude: 1.75 V
Lead Channel Setting Pacing Amplitude: 2.5 V
Lead Channel Setting Pacing Pulse Width: 1 ms
Lead Channel Setting Sensing Sensitivity: 2.8 mV

## 2020-01-30 ENCOUNTER — Other Ambulatory Visit: Payer: Self-pay

## 2020-01-30 ENCOUNTER — Ambulatory Visit (INDEPENDENT_AMBULATORY_CARE_PROVIDER_SITE_OTHER): Payer: Medicare HMO | Admitting: Podiatry

## 2020-01-30 DIAGNOSIS — M86672 Other chronic osteomyelitis, left ankle and foot: Secondary | ICD-10-CM

## 2020-01-30 DIAGNOSIS — L97522 Non-pressure chronic ulcer of other part of left foot with fat layer exposed: Secondary | ICD-10-CM

## 2020-01-31 NOTE — Progress Notes (Signed)
  Subjective:  Patient ID: ARMELLA STOGNER, female    DOB: 1940-08-23,  MRN: [id number]  Chief Complaint  Patient presents with  . Routine Post Op    Pt states some residual redness and pain, denies fever/nausea/vomiting/chills.     DOS: 12/15/19 Procedure: Left great toe partial amputation   79 y.o. female presents with the above complaint. History confirmed with patient.   Objective:  Physical Exam: no tenderness at the surgical site. Incision: healing well skin edges coapted.. No signs of infection.   Assessment:   1. Skin ulcer of left great toe with fat layer exposed ([place])   2. Chronic osteomyelitis involving left ankle and foot ([place])     Plan:  Patient was evaluated and treated and all questions answered.  S/p Left partial hallux amputation -Remaining staples removed. -Ok to apply ointment and band-aid -Continue surgical shoe until she gets diabetic shoes. -F/u in 3 weeks  No follow-ups on file.

## 2020-02-01 NOTE — Progress Notes (Signed)
  Subjective:  Patient ID: Kayla [name], female    DOB: 23-Sep-1940,  MRN: [id number]  Chief Complaint  Patient presents with  . Routine Post Op    POV #3 DOS 12/15/19 LT GREAT TOE PARTIAL AMPUTATION. Pt is well bandaged, pt also states that the sutures and the staples are intact as well.    DOS: 12/15/19 Procedure: Left great toe partial amputation   79 y.o. female presents with the above complaint. History confirmed with patient.   Objective:  Physical Exam: tenderness at the surgical site, local edema noted and calf supple, nontender. Incision: healing well, no significant drainage, no significant erythema, some areas of hyper granular tissue  Assessment:   1. Skin ulcer of left great toe with fat layer exposed ([place])   2. Chronic osteomyelitis involving left ankle and foot ([place])     Plan:  Patient was evaluated and treated and all questions answered.  Post-operative State -Sutures removed today. -Dressed with abx ointment and band-aid -Continue surgical shoe. -Remove staples next visit.  No follow-ups on file.

## 2020-02-06 ENCOUNTER — Other Ambulatory Visit: Payer: Medicare HMO | Admitting: Orthotics

## 2020-02-06 ENCOUNTER — Ambulatory Visit: Payer: Medicare HMO | Admitting: Orthotics

## 2020-02-06 ENCOUNTER — Other Ambulatory Visit: Payer: Self-pay

## 2020-02-06 DIAGNOSIS — M86672 Other chronic osteomyelitis, left ankle and foot: Secondary | ICD-10-CM

## 2020-02-06 DIAGNOSIS — L97522 Non-pressure chronic ulcer of other part of left foot with fat layer exposed: Secondary | ICD-10-CM

## 2020-02-06 NOTE — Progress Notes (Signed)

## 2020-02-10 ENCOUNTER — Ambulatory Visit (INDEPENDENT_AMBULATORY_CARE_PROVIDER_SITE_OTHER): Payer: Medicare HMO

## 2020-02-10 ENCOUNTER — Other Ambulatory Visit: Payer: Self-pay

## 2020-02-10 ENCOUNTER — Encounter (HOSPITAL_COMMUNITY): Payer: Self-pay

## 2020-02-10 ENCOUNTER — Ambulatory Visit (HOSPITAL_COMMUNITY)
Admission: EM | Admit: 2020-02-10 | Discharge: 2020-02-10 | Disposition: A | Discharge status: Home / Self Care | Payer: Medicare HMO | Attending: Family Medicine | Admitting: Family Medicine

## 2020-02-10 DIAGNOSIS — M25571 Pain in right ankle and joints of right foot: Secondary | ICD-10-CM

## 2020-02-10 DIAGNOSIS — M25561 Pain in right knee: Secondary | ICD-10-CM

## 2020-02-10 DIAGNOSIS — G8929 Other chronic pain: Secondary | ICD-10-CM | POA: Diagnosis not present

## 2020-02-10 DIAGNOSIS — M7989 Other specified soft tissue disorders: Secondary | ICD-10-CM | POA: Diagnosis not present

## 2020-02-10 DIAGNOSIS — M1711 Unilateral primary osteoarthritis, right knee: Secondary | ICD-10-CM | POA: Diagnosis not present

## 2020-02-10 DIAGNOSIS — M545 Low back pain, unspecified: Secondary | ICD-10-CM | POA: Diagnosis not present

## 2020-02-10 DIAGNOSIS — R600 Localized edema: Secondary | ICD-10-CM | POA: Diagnosis not present

## 2020-02-10 DIAGNOSIS — M25461 Effusion, right knee: Secondary | ICD-10-CM | POA: Diagnosis not present

## 2020-02-10 NOTE — ED Provider Notes (Signed)
[facility]    CSN: [id number] Arrival date & time: 02/10/20  1201      History   Chief Complaint Chief Complaint  Patient presents with   Knee Pain   Ankle Pain    HPI Kayla [name] is a 79 y.o. female.   Here today with right hip, knee and ankle pain since 2 falls this morning at [facility] that she states happened from water being tracked in on the floor. She states she slipped when first entering the building and twisted the knee and ankle but was able to catch herself grabbing onto the doorknob. She then later was in the restroom and fell completely onto her bottom and right hip. She is able to bear weight on the hip but not able to bend at the knee or ankle and states the knee and ankle are severely painful even after a norco which she takes for her chronic back pain. No edema or bruising, numbness, tingling, color change reported. Using ice off and on with very minimal relief.       Past Medical History:  Diagnosis Date   Allergy    Anxiety    Arthritis    CAD (coronary artery disease)    Mild per remote cath in 2006, /    Nuclear, January, 2013, no significant abnormality,   Carotid artery disease ([place])    Doppler, January, 2013, 0 31% RIC A., 49-70% LICA, stable   Cataract    CHF (congestive heart failure) ([place])    Complication of anesthesia    wakes up "mean"   COPD 02/16/2007   Intolerant of Spiriva, tudorza Intolerant of Trelegy    COPD (chronic obstructive pulmonary disease) ([place])    Coronary atherosclerosis 11/24/2008   Catheterization 2006, nonobstructive coronary disease   //   nuclear 2013, low risk     Diabetes mellitus, type 2 ([place])    Diverticula of colon    DIVERTICULOSIS OF COLON 03/23/2007   Qualifier: Diagnosis of  By: Halford Chessman MD, Vineet     Dizziness    occasional   Dizzy spells 05/04/2011   DM (diabetes mellitus) ([place]) 03/23/2007   Qualifier: Diagnosis of  By: Halford Chessman MD, Vineet     Dyspnea    On exertion     Ejection fraction    Essential hypertension 02/16/2007   Qualifier: Diagnosis of  By: Rosana Hoes CMA, Tammy     G E R D 03/23/2007   Qualifier: Diagnosis of  By: Halford Chessman MD, Vineet     Gall bladder disease 04/28/2016   Gastritis and gastroduodenitis    GERD (gastroesophageal reflux disease)    Goiter    hx of   Headache(784.0)    migraine   Hyperlipidemia    Hypertension    Hypothyroidism    HYPOTHYROIDISM, POSTSURGICAL 06/04/2008   Qualifier: Diagnosis of  By: Loanne Drilling MD, Sean A    Left ventricular hypertrophy    Leg edema    occasional swelling   Leg swelling 12/09/2018   Myofascial pain syndrome, cervical 01/06/2018   Obesity    OBESITY 11/24/2008   Qualifier: Diagnosis of  By: Sidney Ace     OBSTRUCTIVE SLEEP APNEA 02/16/2007   Auto CPAP 09/03/13 to 10/02/13 >> used on 30 of 30 nights with average 6 hrs and 3 min.  Average AHI is 3.1 with median CPAP 5 cm H2O and 95 th percentile CPAP 6 cm H2O.     Occipital neuralgia of right side 01/06/2018   OSA (  obstructive sleep apnea)    cpap setting of 12   Pain in joint of right hip 01/21/2018   Palpitations 01/21/2011   48 hour Holter, 2015, scattered PACs and PVCs.    Pneumonia    PONV (postoperative nausea and vomiting)    severe after every surgery   Pulmonary hypertension, secondary    RHINITIS 07/21/2010        RUQ abdominal pain    S/P left TKA 07/27/2011   Sinus node dysfunction ([place]) 11/08/2015   Sleep apnea    Spinal stenosis of cervical region 11/07/2014   Urinary frequency 12/09/2018   UTI (urinary tract infection) 08/15/2019   per pt report   VENTRICULAR HYPERTROPHY, LEFT 11/24/2008   Qualifier: Diagnosis of  By: Sidney Ace      Patient Active Problem List   Diagnosis Date Noted   Pacemaker - MDT 01/24/2020   Orthostatic lightheadedness 01/24/2020   Chronic osteomyelitis involving left ankle and foot (HCC)    Paroxysmal A-fib ([place]) 10/30/2019   Paroxysmal atrial fibrillation ([place])  10/25/2019   Secondary hypercoagulable state ([place]) 10/25/2019   Numbness and tingling of right leg 10/19/2019   Lumbar radiculopathy 10/19/2019   Acute exacerbation of CHF (congestive heart failure) ([employer]) 08/13/2019   COPD with acute exacerbation ([place]) 08/13/2019   Acute respiratory failure ([place]) 08/13/2019   Chest pain 08/13/2019   Leukocytosis 08/13/2019   Leg swelling 12/09/2018   Urinary frequency 12/09/2018   Pain in joint of right hip 01/21/2018   Myofascial pain syndrome, cervical 01/06/2018   Occipital neuralgia of right side 01/06/2018   RUQ abdominal pain    Gastritis and gastroduodenitis    Bile duct abnormality    Cholecystitis 04/30/2016   Gall bladder disease 04/28/2016   Sinus node dysfunction ([place]) 11/08/2015   Ejection fraction    Spinal stenosis of cervical region 11/07/2014   Preoperative clearance 10/09/2014   Carotid artery disease ([place])    S/P left TKA 07/27/2011   Dizzy spells 05/04/2011   Palpitations 01/21/2011   RHINITIS 07/21/2010   Hyperlipidemia 11/24/2008   OBESITY 11/24/2008   Coronary atherosclerosis 11/24/2008   VENTRICULAR HYPERTROPHY, LEFT 11/24/2008   HYPOTHYROIDISM, POSTSURGICAL 06/04/2008   Headache 06/04/2008   DM (diabetes mellitus) ([place]) 03/23/2007   G E R D 03/23/2007   DIVERTICULOSIS OF COLON 03/23/2007   GENERALIZED OSTEOARTHROSIS UNSPECIFIED SITE 03/23/2007   OBSTRUCTIVE SLEEP APNEA 02/16/2007   Essential hypertension 02/16/2007   COPD 02/16/2007    Past Surgical History:  Procedure Laterality Date   ABDOMINAL HYSTERECTOMY     with appendectomy and colon polypectomy   AMPUTATION TOE Left 12/15/2019   Procedure: AMPUTATION  LEFT GREAT TOE;  Surgeon: Evelina Bucy, DPM;  Location: [facility];  Service: Podiatry;  Laterality: Left;   ANTERIOR CERVICAL DECOMP/DISCECTOMY FUSION N/A 11/07/2014   Procedure: Anterior Cervical diskectomy and fusion Cervical four-five, Cervical five-six,  Cervical six-seven;  Surgeon: Kary Kos, MD;  Location: [facility];  Service: Neurosurgery;  Laterality: N/A;   APPENDECTOMY     ARTERY BIOPSY Right 02/16/2017   Procedure: BIOPSY TEMPORAL ARTERY;  Surgeon: Katha Cabal, MD;  Location: [facility] ORS;  Service: Vascular;  Laterality: Right;   BACK SURGERY     CARDIAC CATHETERIZATION     CATARACT EXTRACTION W/ INTRAOCULAR LENS  IMPLANT, BILATERAL Bilateral    CHOLECYSTECTOMY N/A 04/29/2016   Procedure: LAPAROSCOPIC CHOLECYSTECTOMY WITH INTRAOPERATIVE CHOLANGIOGRAM;  Surgeon: Alphonsa Overall, MD;  Location: [facility];  Service: General;  Laterality: N/A;   EP  IMPLANTABLE DEVICE N/A 11/08/2015   MRI COMPATABLE -- Procedure: Pacemaker Implant;  Surgeon: Deboraha Sprang, MD;  Location: [facility];  Service: Cardiovascular;  Laterality: N/A;   INSERT / REPLACE / REMOVE PACEMAKER  2017   Dr. Jens Som [facility])   JOINT REPLACEMENT     LUMBAR [place] SURGERY  1990's   x 2   THYROIDECTOMY  2011   TOTAL KNEE ARTHROPLASTY  07/27/2011   Procedure: TOTAL KNEE ARTHROPLASTY;  Surgeon: Mauri Pole, MD;  Location: [facility];  Service: Orthopedics;  Laterality: Left;   UPPER ESOPHAGEAL ENDOSCOPIC ULTRASOUND (EUS) N/A 05/14/2016   Procedure: UPPER ESOPHAGEAL ENDOSCOPIC ULTRASOUND (EUS);  Surgeon: Milus Banister, MD;  Location: [facility];  Service: Endoscopy;  Laterality: N/A;  radial only-will be done mod if needed    OB History   No obstetric history on file.      Home Medications    Prior to Admission medications   Medication Sig Start Date End Date Taking? Authorizing Provider  albuterol (VENTOLIN HFA) 108 (90 Base) MCG/ACT inhaler Inhale 2 puffs into the lungs every 6 (six) hours as needed for wheezing or shortness of breath. 08/09/17  Yes Chesley Mires, MD  allopurinol (ZYLOPRIM) 100 MG tablet Take 100 mg by mouth 2 (two) times daily.  04/16/19  Yes [provider]  atorvastatin (LIPITOR) 40 MG tablet Take 1 tablet (40 mg  total) by mouth daily. 10/11/19  Yes Jerline Pain, MD  Cholecalciferol (VITAMIN D-3 PO) Take 1 capsule by mouth daily with breakfast.   Yes [provider]  ELIQUIS 5 MG TABS tablet TAKE 1 TABLET TWICE DAILY 12/18/19  Yes Fenton, Clint R, PA  ferrous sulfate 325 (65 FE) MG tablet Take 1 tablet (325 mg total) by mouth daily with breakfast. 01/26/20  Yes Deboraha Sprang, MD  fluticasone [facility]) 50 MCG/ACT nasal spray USE 2 SPRAYS NASALLY DAILY 02/26/17  Yes Collene Gobble, MD  gabapentin (NEURONTIN) 800 MG tablet Take 800 mg by mouth See admin instructions. Take 800 mg by mouth three to four times a day 09/20/17  Yes [provider]  HYDROcodone-acetaminophen (NORCO/VICODIN) 5-325 MG tablet Take 1-2 tablets by mouth every 6 (six) hours as needed for moderate pain. 04/30/16  Yes Meuth, Brooke A, PA-C  insulin aspart (NOVOLOG FLEXPEN) 100 UNIT/ML FlexPen Inject 30-70 Units into the skin See admin instructions. Inject 30-70 units into the skin three times a day with meals, per sliding scale (and an additional 1 unit for every 50 points for a BGL >130) Sliding scale   Yes [provider]  insulin glargine (LANTUS) 100 UNIT/ML injection Inject 130 Units into the skin in the morning and at bedtime.    Yes [provider]  levothyroxine (SYNTHROID) 175 MCG tablet Take 175 mcg by mouth daily before breakfast.  02/24/19  Yes [provider]  losartan (COZAAR) 50 MG tablet Take 1 tablet (50 mg total) by mouth daily. 12/28/19 03/27/20 Yes Deboraha Sprang, MD  magnesium oxide (MAG-OX) 400 (241.3 Mg) MG tablet Take 400 mg by mouth 3 (three) times daily.  12/08/19  Yes [provider]  metoprolol succinate (TOPROL-XL) 100 MG 24 hr tablet Take 1 tablet (100 mg total) by mouth daily. Take with or immediately following a meal. 12/28/19 03/27/20 Yes Deboraha Sprang, MD  Tiotropium Bromide Monohydrate (SPIRIVA RESPIMAT) 2.5 MCG/ACT AERS Inhale 2 puffs into the lungs daily.  10/30/19  Yes Chesley Mires, MD  torsemide (DEMADEX) 20 MG tablet  Take 2 tablets (40 mg total) by mouth every other day. 01/26/20  Yes Deboraha Sprang, MD  vitamin B-12 (CYANOCOBALAMIN) 1000 MCG tablet Take 1,000 mcg by mouth daily.   Yes [provider]  acetaminophen (TYLENOL) 500 MG tablet Take 1,000 mg by mouth every 6 (six) hours as needed for moderate pain (for headaches).     [provider]  albuterol (PROVENTIL) (2.5 MG/3ML) 0.083% nebulizer solution Take 2.5 mg by nebulization every 2 (two) hours as needed for wheezing or shortness of breath.     [provider]  Alcohol Swabs (B-D SINGLE USE SWABS REGULAR) PADS  11/24/19   [provider]  Blood Glucose Calibration (TRUE METRIX LEVEL 1) Low SOLN  11/27/19   [provider]  budesonide-formoterol (SYMBICORT) 160-4.5 MCG/ACT inhaler Inhale 2 puffs into the lungs 2 (two) times daily for 1 day. 10/30/19 12/28/19  Chesley Mires, MD  Dextromethorphan-guaiFENesin (TUSSIN DM MAX SUGAR-FREE PO) Take 5 mLs by mouth every 4 (four) hours as needed (for coughing).    [provider]  LORazepam (ATIVAN) 2 MG tablet Take 2 mg by mouth at bedtime as needed for anxiety.  08/20/14   [provider]  nitroGLYCERIN (NITROSTAT) 0.4 MG SL tablet Place 1 tablet (0.4 mg total) under the tongue every 5 (five) minutes as needed for chest pain. 04/13/19   Jerline Pain, MD  oxyCODONE-acetaminophen (PERCOCET) 10-325 MG tablet Take 1 tablet by mouth every 4 (four) hours as needed for pain. 12/29/19   Evelina Bucy, DPM  PRESCRIPTION MEDICATION CPAP: At bedtime and "as needed for shortness of breath"    [provider]  promethazine (PHENADOZ) 25 MG suppository Place 25 mg rectally daily as needed (for headaches).     [provider]  TRUE METRIX BLOOD GLUCOSE TEST test strip  09/18/17   [provider]  TRUEplus Lancets 28G MISC  11/24/19   [provider]    Family  History Family History  Problem Relation Age of Onset   Heart Problems Mother    Heart defect Mother        valve problems   Heart Problems Father    Heart disease Brother    Lung cancer Daughter        non small cell    Diabetes Maternal Aunt    Diabetes Maternal Uncle    Throat cancer Brother    Colon cancer Neg Hx    Colon polyps Neg Hx    Esophageal cancer Neg Hx    Rectal cancer Neg Hx    Stomach cancer Neg Hx     Social History Social History   Tobacco Use   Smoking status: Former Smoker    Packs/day: 2.00    Years: 33.00    Pack years: 66.00    Types: Cigarettes    Start date: 1966    Quit date: 05/04/1997    Years since quitting: 22.7   Smokeless tobacco: Never Used  Vaping Use   Vaping Use: Never used  Substance Use Topics   Alcohol use: No   Drug use: No     Allergies   Dulaglutide   Review of Systems Review of Systems PER HPI    Physical Exam Triage Vital Signs ED Triage Vitals  Enc Vitals Group     BP 02/10/20 1416 (!) 173/69     Pulse Rate 02/10/20 1416 64     Resp 02/10/20 1416 19     Temp 02/10/20 1416 98.1  F (36.7 C)     Temp Source 02/10/20 1416 Oral     SpO2 02/10/20 1416 97 %     Weight --      Height --      Head Circumference --      Peak Flow --      Pain Score 02/10/20 1414 10     Pain Loc --      Pain Edu? --      Excl. in [place]? --    No data found.  Updated Vital Signs BP (!) 173/69 (BP Location: Right Arm)    Pulse 64    Temp 98.1 F (36.7 C) (Oral)    Resp 19    SpO2 97%   Visual Acuity Right Eye Distance:   Left Eye Distance:   Bilateral Distance:    Right Eye Near:   Left Eye Near:    Bilateral Near:     Physical Exam Vitals and nursing note reviewed.  Constitutional:      Appearance: Normal appearance. She is not ill-appearing.  HENT:     Head: Atraumatic.  Eyes:     Extraocular Movements: Extraocular movements intact.     Conjunctiva/sclera: Conjunctivae normal.   Cardiovascular:     Rate and Rhythm: Normal rate and regular rhythm.     Pulses: Normal pulses.     Heart sounds: Normal heart sounds.  Pulmonary:     Effort: Pulmonary effort is normal.     Breath sounds: Normal breath sounds.  Musculoskeletal:     Cervical back: Normal range of motion and neck supple.     Comments: Sitting in wheelchair Unable to bear weight on knee or ankle to do portions of orthopedic exam. Lateral right ankle significantly ttp as is lateral and anterior right knee. No significant edema either location. No joint laxity on drawer test.  Skin:    General: Skin is warm and dry.  Neurological:     Mental Status: She is alert and oriented to person, place, and time.     Sensory: No sensory deficit.  Psychiatric:        Mood and Affect: Mood normal.        Thought Content: Thought content normal.        Judgment: Judgment normal.      UC Treatments / Results  Labs (all labs ordered are listed, but only abnormal results are displayed) Labs Reviewed - No data to display  EKG   Radiology No results found.  Procedures Procedures (including critical care time)  Medications Ordered in UC Medications - No data to display  Initial Impression / Assessment and Plan / UC Course  I have reviewed the triage vital signs and the nursing notes.  Pertinent labs & imaging results that were available during my care of the patient were reviewed by me and considered in my medical decision making (see chart for details).     Ankle films without abnormality, suspect strain from twisting injury during fall. RICE discussed. No new changes with her back Possible tibial plateau fracture per Radiologist report of right knee films - will place in a knee immobilizer and have her schedule early next week with Orthopedics. Discussed supportive home care and return precautions. She endorses having a walker and a spouse who is able to help her get around and with ADLs over the  weekend. Already has norco that she takes TID prn for her back - take this and some additional tylenol as needed for  breakthrough pain.   Final Clinical Impressions(s) / UC Diagnoses   Final diagnoses:  Acute pain of right knee  Acute right ankle pain  Chronic midline low back pain without sciatica     Discharge Instructions     Call Monday morning to get an appointment set up for follow up  [facility] [address] [place]  [phone] Closed ? Opens 8AM Mon    ED Prescriptions    None     PDMP not reviewed this encounter.   Volney American, [place] 02/10/20 [phone]

## 2020-02-10 NOTE — ED Triage Notes (Signed)
Patient in with c/o of right hip, knee, ankle, and back pain after fall in [place] this morning. Patient was walking in and slipped on water and almost fell but was able to grab the railing and catch herself. Patient then ate and got up to walk to bathroom and slipped and fell on her bottom.   She was assisted off the ground by her husband and was able to walk back to her vehicle with assistance from husband and her cane.  Patient had 1 tablet of hydrocodone with minimal relief  Pain in ankle is increased with movement on lateral side. +2 pedal pulses  Denies numbness or tingling

## 2020-02-10 NOTE — Discharge Instructions (Addendum)
Call Monday morning to get an appointment set up for follow up  [facility] [address]  [phone] Closed ? Opens 8AM Mon

## 2020-02-12 ENCOUNTER — Encounter: Payer: Self-pay | Admitting: Family Medicine

## 2020-02-12 ENCOUNTER — Ambulatory Visit (INDEPENDENT_AMBULATORY_CARE_PROVIDER_SITE_OTHER): Payer: Medicare HMO | Admitting: Family Medicine

## 2020-02-12 ENCOUNTER — Other Ambulatory Visit: Payer: Self-pay

## 2020-02-12 DIAGNOSIS — M25561 Pain in right knee: Secondary | ICD-10-CM | POA: Diagnosis not present

## 2020-02-12 MED ORDER — OXYCODONE-ACETAMINOPHEN 10-325 MG PO TABS
1.0000 | ORAL_TABLET | ORAL | 0 refills | Status: DC | PRN
Start: 2020-02-12 — End: 2020-02-14

## 2020-02-12 NOTE — Progress Notes (Signed)
Office Visit Note   Patient: Kayla [name]           Date of Birth: [birth year]           MRN: [id number] Visit Date: 02/12/2020 Requested by: [name], MD [place],  [place] [id number] PCP: [name], MD  Subjective: Chief Complaint  Patient presents with   Right Knee - Pain, Injury    Slipped and fell twice at a restaurant 02/10/20. Went to an urgent care and had xrays - questionable tibial plateau fracture. Was placed in a knee immobilizer  - uses walker at home.    HPI: She is here with right knee pain.  2 days ago she slipped and fell at a restaurant.  First she slipped and twisted her knee without falling, but later on her way to the bathroom she slipped on wet floor and landed directly on her right side.  Unable to bear weight since then.  She went to the an urgent care where x-rays of her knee revealed a possible lateral tibial plateau fracture.  She was given a knee immobilizer and now presents for evaluation.  She has a history of left knee replacement.  No problems with the right.  She is in severe pain despite her usual hydrocodone which she takes for chronic back pain per Dr. Lisbeth Ply.              ROS:   All other systems were reviewed and are negative.  Objective: Vital Signs: There were no vitals taken for this visit.  Physical Exam:  General:  Alert and oriented, in no acute distress. Pulm:  Breathing unlabored. Psy:  Normal mood, congruent affect. Skin: No bruising Right knee: 2+ effusion with no warmth.  She has intact extensor mechanism.  Severe pain when I place valgus stress on the knee, no definite laxity.  She is tender along the medial and lateral joint lines but especially lateral.  There is no significant tenderness to palpation of the femur shaft or the tibia/fibula.  She does have a little bit of tenderness around the ankle but nothing severe.  Imaging: None today but recent x-rays were reviewed showing a possible  lateral tibial plateau fracture.    Assessment & Plan: 1.  2 days status post fall with left knee pain, suspicious for lateral tibial plateau fracture. -CT scan to further evaluate.  She cannot have MRI scan due to pacemaker. -Short-term use of oxycodone for pain.  Knee immobilizer.     Procedures: No procedures performed  No notes on file     PMFS History: Patient Active Problem List   Diagnosis Date Noted   Pacemaker - MDT 01/24/2020   Orthostatic lightheadedness 01/24/2020   Chronic osteomyelitis involving left ankle and foot (HCC)    Paroxysmal A-fib (HCC) 10/30/2019   Paroxysmal atrial fibrillation ([place]) 10/25/2019   Secondary hypercoagulable state ([place]) 10/25/2019   Numbness and tingling of right leg 10/19/2019   Lumbar radiculopathy 10/19/2019   Acute exacerbation of CHF (congestive heart failure) ([place]) 08/13/2019   COPD with acute exacerbation ([place]) 08/13/2019   Acute respiratory failure ([place]) 08/13/2019   Chest pain 08/13/2019   Leukocytosis 08/13/2019   Leg swelling 12/09/2018   Urinary frequency 12/09/2018   Pain in joint of right hip 01/21/2018   Myofascial pain syndrome, cervical 01/06/2018   Occipital neuralgia of right side 01/06/2018   RUQ abdominal pain    Gastritis and gastroduodenitis    Bile duct  abnormality    Cholecystitis 04/30/2016   Gall bladder disease 04/28/2016   Sinus node dysfunction (HCC) 11/08/2015   Ejection fraction    Spinal stenosis of cervical region 11/07/2014   Preoperative clearance 10/09/2014   Carotid artery disease ([place])    S/P left TKA 07/27/2011   Dizzy spells 05/04/2011   Palpitations 01/21/2011   RHINITIS 07/21/2010   Hyperlipidemia 11/24/2008   OBESITY 11/24/2008   Coronary atherosclerosis 11/24/2008   VENTRICULAR HYPERTROPHY, LEFT 11/24/2008   HYPOTHYROIDISM, POSTSURGICAL 06/04/2008   Headache 06/04/2008   DM (diabetes mellitus) ([place]) 03/23/2007   G E R D 03/23/2007    DIVERTICULOSIS OF COLON 03/23/2007   GENERALIZED OSTEOARTHROSIS UNSPECIFIED SITE 03/23/2007   OBSTRUCTIVE SLEEP APNEA 02/16/2007   Essential hypertension 02/16/2007   COPD 02/16/2007   Past Medical History:  Diagnosis Date   Allergy    Anxiety    Arthritis    CAD (coronary artery disease)    Mild per remote cath in 2006, /    Nuclear, January, 2013, no significant abnormality,   Carotid artery disease ([place])    Doppler, January, 2013, 0 93% RIC A., 81-82% LICA, stable   Cataract    CHF (congestive heart failure) ([place])    Complication of anesthesia    wakes up "mean"   COPD 02/16/2007   Intolerant of Spiriva, tudorza Intolerant of Trelegy    COPD (chronic obstructive pulmonary disease) ([place])    Coronary atherosclerosis 11/24/2008   Catheterization 2006, nonobstructive coronary disease   //   nuclear 2013, low risk     Diabetes mellitus, type 2 ([place])    Diverticula of colon    DIVERTICULOSIS OF COLON 03/23/2007   Qualifier: Diagnosis of  By: Halford Chessman MD, Vineet     Dizziness    occasional   Dizzy spells 05/04/2011   DM (diabetes mellitus) ([place]) 03/23/2007   Qualifier: Diagnosis of  By: Halford Chessman MD, Vineet     Dyspnea    On exertion   Ejection fraction    Essential hypertension 02/16/2007   Qualifier: Diagnosis of  By: Rosana Hoes CMA, Tammy     G E R D 03/23/2007   Qualifier: Diagnosis of  By: Halford Chessman MD, Vineet     Gall bladder disease 04/28/2016   Gastritis and gastroduodenitis    GERD (gastroesophageal reflux disease)    Goiter    hx of   Headache(784.0)    migraine   Hyperlipidemia    Hypertension    Hypothyroidism    HYPOTHYROIDISM, POSTSURGICAL 06/04/2008   Qualifier: Diagnosis of  By: Loanne Drilling MD, Sean A    Left ventricular hypertrophy    Leg edema    occasional swelling   Leg swelling 12/09/2018   Myofascial pain syndrome, cervical 01/06/2018   Obesity    OBESITY 11/24/2008   Qualifier: Diagnosis of  By: Sidney Ace     OBSTRUCTIVE  SLEEP APNEA 02/16/2007   Auto CPAP 09/03/13 to 10/02/13 >> used on 30 of 30 nights with average 6 hrs and 3 min.  Average AHI is 3.1 with median CPAP 5 cm H2O and 95 th percentile CPAP 6 cm H2O.     Occipital neuralgia of right side 01/06/2018   OSA (obstructive sleep apnea)    cpap setting of 12   Pain in joint of right hip 01/21/2018   Palpitations 01/21/2011   48 hour Holter, 2015, scattered PACs and PVCs.    Pneumonia    PONV (postoperative nausea and vomiting)    severe after every surgery  Pulmonary hypertension, secondary    RHINITIS 07/21/2010        RUQ abdominal pain    S/P left TKA 07/27/2011   Sinus node dysfunction ([place]) 11/08/2015   Sleep apnea    Spinal stenosis of cervical region 11/07/2014   Urinary frequency 12/09/2018   UTI (urinary tract infection) 08/15/2019   per pt report   VENTRICULAR HYPERTROPHY, LEFT 11/24/2008   Qualifier: Diagnosis of  By: Sidney Ace      Family History  Problem Relation Age of Onset   Heart Problems Mother    Heart defect Mother        valve problems   Heart Problems Father    Heart disease Brother    Lung cancer Daughter        non small cell    Diabetes Maternal Aunt    Diabetes Maternal Uncle    Throat cancer Brother    Colon cancer Neg Hx    Colon polyps Neg Hx    Esophageal cancer Neg Hx    Rectal cancer Neg Hx    Stomach cancer Neg Hx     Past Surgical History:  Procedure Laterality Date   ABDOMINAL HYSTERECTOMY     with appendectomy and colon polypectomy   AMPUTATION TOE Left 12/15/2019   Procedure: AMPUTATION  LEFT GREAT TOE;  Surgeon: Evelina Bucy, DPM;  [facility];  Service: Podiatry;  Laterality: Left;   ANTERIOR CERVICAL DECOMP/DISCECTOMY FUSION N/A 11/07/2014   Procedure: Anterior Cervical diskectomy and fusion Cervical four-five, Cervical five-six, Cervical six-seven;  Surgeon: Kary Kos, MD;  Location: [facility];  Service: Neurosurgery;  Laterality: N/A;   APPENDECTOMY      ARTERY BIOPSY Right 02/16/2017   Procedure: BIOPSY TEMPORAL ARTERY;  Surgeon: Katha Cabal, MD;  Location: [facility] ORS;  Service: Vascular;  Laterality: Right;   BACK SURGERY     CARDIAC CATHETERIZATION     CATARACT EXTRACTION W/ INTRAOCULAR LENS  IMPLANT, BILATERAL Bilateral    CHOLECYSTECTOMY N/A 04/29/2016   Procedure: LAPAROSCOPIC CHOLECYSTECTOMY WITH INTRAOPERATIVE CHOLANGIOGRAM;  Surgeon: Alphonsa Overall, MD;  [facility];  Service: General;  Laterality: N/A;   EP IMPLANTABLE DEVICE N/A 11/08/2015   MRI COMPATABLE -- Procedure: Pacemaker Implant;  Surgeon: Deboraha Sprang, MD;  Location: [facility];  Service: Cardiovascular;  Laterality: N/A;   INSERT / REPLACE / REMOVE PACEMAKER  2017   Dr. Jens Som [facility])   JOINT REPLACEMENT     LUMBAR [place] SURGERY  1990's   x 2   THYROIDECTOMY  2011   TOTAL KNEE ARTHROPLASTY  07/27/2011   Procedure: TOTAL KNEE ARTHROPLASTY;  Surgeon: Mauri Pole, MD;  [facility];  Service: Orthopedics;  Laterality: Left;   UPPER ESOPHAGEAL ENDOSCOPIC ULTRASOUND (EUS) N/A 05/14/2016   Procedure: UPPER ESOPHAGEAL ENDOSCOPIC ULTRASOUND (EUS);  Surgeon: Milus Banister, MD;  Location: [facility];  Service: Endoscopy;  Laterality: N/A;  radial only-will be done mod if needed   Social History   Occupational History   Occupation: retired  Tobacco Use   Smoking status: Former Smoker    Packs/day: 2.00    Years: 33.00    Pack years: 66.00    Types: Cigarettes    Start date: 1966    Quit date: 05/04/1997    Years since quitting: 22.7   Smokeless tobacco: Never Used  Vaping Use   Vaping Use: Never used  Substance and Sexual Activity   Alcohol use: No   Drug use: No  Sexual activity: Not on file

## 2020-02-14 ENCOUNTER — Telehealth: Payer: Self-pay | Admitting: Family Medicine

## 2020-02-14 ENCOUNTER — Ambulatory Visit
Admission: RE | Admit: 2020-02-14 | Discharge: 2020-02-14 | Disposition: A | Discharge status: Home / Self Care | Payer: Medicare HMO | Source: Ambulatory Visit | Attending: Family Medicine | Admitting: Family Medicine

## 2020-02-14 ENCOUNTER — Telehealth: Payer: Self-pay

## 2020-02-14 ENCOUNTER — Other Ambulatory Visit: Payer: Self-pay

## 2020-02-14 DIAGNOSIS — S82291A Other fracture of shaft of right tibia, initial encounter for closed fracture: Secondary | ICD-10-CM | POA: Diagnosis not present

## 2020-02-14 DIAGNOSIS — S82124A Nondisplaced fracture of lateral condyle of right tibia, initial encounter for closed fracture: Secondary | ICD-10-CM | POA: Diagnosis not present

## 2020-02-14 DIAGNOSIS — M25561 Pain in right knee: Secondary | ICD-10-CM

## 2020-02-14 DIAGNOSIS — S82144A Nondisplaced bicondylar fracture of right tibia, initial encounter for closed fracture: Secondary | ICD-10-CM | POA: Diagnosis not present

## 2020-02-14 DIAGNOSIS — M7989 Other specified soft tissue disorders: Secondary | ICD-10-CM | POA: Diagnosis not present

## 2020-02-14 MED ORDER — OXYCODONE-ACETAMINOPHEN 10-325 MG PO TABS
1.0000 | ORAL_TABLET | ORAL | 0 refills | Status: DC | PRN
Start: 2020-02-14 — End: 2020-02-20

## 2020-02-14 NOTE — Telephone Encounter (Signed)
Rx sent.  I don't have a way to cancel the [facility] Rx, though.

## 2020-02-14 NOTE — Telephone Encounter (Signed)
I called and advised the patient. She said [employer] calls her before delivering this type of medicine, anyway, so she will cancel it when they call.

## 2020-02-14 NOTE — Telephone Encounter (Signed)
The pharmacist from [facility] called regarding the Rx for Percocet sent in today. He called [employer] to see if that one could be cancelled, but they said it was already on the way to the patient. She should receive it as early as 2 days from now. I spoke with Dr. Junius Roads - ok to fill #10 of the Percocet to get her through to when the mail order supply comes in.

## 2020-02-14 NOTE — Telephone Encounter (Signed)
Patient called asked if the Rx for the pain medicine be sent  [facility] [place] Phone#  [phone]    Patient asked that the order for [facility] be canceled. Patient said she is having a CT scan today. Patient said she really need the pain medicine.  The number to contact patient is [phone]

## 2020-02-15 ENCOUNTER — Telehealth: Payer: Self-pay | Admitting: Family Medicine

## 2020-02-15 NOTE — Telephone Encounter (Signed)
I called and advised the patient of the results and instructions.She and her husband (on speaker) voiced understanding. She is already on Eliquis (blood thinner). Scheduled her a f/u appt for 02/29/20 at 9:40.

## 2020-02-15 NOTE — Telephone Encounter (Signed)
CT scan confirms a tibia plateau fracture.  It is non-displaced, so no surgery needed as long as it holds its position.    Continue non weight-bearing, but ok to bend knee a little bit, using pain as guide.  I would suggest taking a baby aspirin once daily for the next few weeks to prevent blood clots (unless she's already on a blood thinner, but I didn't see one on her list).    Repeat x-rays in 2 weeks.

## 2020-02-16 ENCOUNTER — Other Ambulatory Visit: Payer: Self-pay

## 2020-02-16 ENCOUNTER — Other Ambulatory Visit: Payer: Medicare HMO | Admitting: *Deleted

## 2020-02-16 DIAGNOSIS — Z950 Presence of cardiac pacemaker: Secondary | ICD-10-CM

## 2020-02-16 DIAGNOSIS — I480 Paroxysmal atrial fibrillation: Secondary | ICD-10-CM | POA: Diagnosis not present

## 2020-02-16 DIAGNOSIS — I495 Sick sinus syndrome: Secondary | ICD-10-CM

## 2020-02-16 DIAGNOSIS — R42 Dizziness and giddiness: Secondary | ICD-10-CM | POA: Diagnosis not present

## 2020-02-16 LAB — BASIC METABOLIC PANEL
BUN/Creatinine Ratio: 23 (ref 12–28)
BUN: 31 mg/dL — ABNORMAL HIGH (ref 8–27)
CO2: 24 mmol/L (ref 20–29)
Calcium: 9.8 mg/dL (ref 8.7–10.3)
Chloride: 103 mmol/L (ref 96–106)
Creatinine, Ser: 1.32 mg/dL — ABNORMAL HIGH (ref 0.57–1.00)
GFR calc Af Amer: 45 mL/min/{1.73_m2} — ABNORMAL LOW (ref >59)
GFR calc non Af Amer: 39 mL/min/{1.73_m2} — ABNORMAL LOW (ref >59)
Glucose: 207 mg/dL — ABNORMAL HIGH (ref 65–99)
Potassium: 4.8 mmol/L (ref 3.5–5.2)
Sodium: 142 mmol/L (ref 134–144)

## 2020-02-20 ENCOUNTER — Telehealth: Payer: Self-pay | Admitting: Family Medicine

## 2020-02-20 MED ORDER — OXYCODONE-ACETAMINOPHEN 10-325 MG PO TABS
1.0000 | ORAL_TABLET | ORAL | 0 refills | Status: DC | PRN
Start: 2020-02-20 — End: 2020-02-29

## 2020-02-20 NOTE — Telephone Encounter (Signed)
Patient called requesting a refill of oxycodone. [facility] [place]. Please call patient at [phone].

## 2020-02-20 NOTE — Telephone Encounter (Signed)
Sent!

## 2020-02-20 NOTE — Telephone Encounter (Signed)
I called and advised the patient. 

## 2020-02-29 ENCOUNTER — Ambulatory Visit (INDEPENDENT_AMBULATORY_CARE_PROVIDER_SITE_OTHER): Payer: Medicare HMO

## 2020-02-29 ENCOUNTER — Other Ambulatory Visit: Payer: Self-pay

## 2020-02-29 ENCOUNTER — Encounter: Payer: Self-pay | Admitting: Family Medicine

## 2020-02-29 ENCOUNTER — Ambulatory Visit (INDEPENDENT_AMBULATORY_CARE_PROVIDER_SITE_OTHER): Payer: Medicare HMO | Admitting: Family Medicine

## 2020-02-29 DIAGNOSIS — S82141D Displaced bicondylar fracture of right tibia, subsequent encounter for closed fracture with routine healing: Secondary | ICD-10-CM | POA: Diagnosis not present

## 2020-02-29 MED ORDER — OXYCODONE-ACETAMINOPHEN 10-325 MG PO TABS: 0.5000 | ORAL_TABLET | Freq: Two times a day (BID) | ORAL | 0 refills | Status: DC | PRN

## 2020-02-29 NOTE — Progress Notes (Signed)
Office Visit Note   Patient: Kayla [name]           Date of Birth: [birth year]           MRN: [id number] Visit Date: 02/29/2020 Requested by: Kayla Sake, MD [place],  [place] [id number] PCP: Kayla Sake, MD  Subjective: Chief Complaint  Patient presents with  . Right Knee - Pain    2 week f/u for tibial plateau fracture. In knee immobilizer most of the time. Keeping weight off the leg - using walker.     HPI: She is about 3-week status post fall resulting in right knee lateral tibial plateau fracture. Pain is improving. Still requiring oxycodone twice daily. She is moving her knee occasionally to prevent stiffness. She is not yet weightbearing.              ROS:   All other systems were reviewed and are negative.  Objective: Vital Signs: There were no vitals taken for this visit.  Physical Exam:  General:  Alert and oriented, in no acute distress. Pulm:  Breathing unlabored. Psy:  Normal mood, congruent affect. Skin: No erythema or bruising Right knee: No effusion today. Slightly tender over the lateral tibial plateau. Active range of motion is full extension to about 75 degrees flexion before pain limits further bending.  Imaging: XR Knee 1-2 Views Right  Result Date: 02/29/2020 X-rays reveal anatomic alignment of the fracture, no sign of displacement/depression. Fracture is not clearly visible on today's x-rays.   Assessment & Plan: 1. Stable 3-week status post right tibial plateau fracture -Okay to continue working on active range of motion to tolerance. Minimize weightbearing still. Return in about 3 weeks for two-view knee x-ray. If clinically improving, we will allow her to start weightbearing at that point.     Procedures: No procedures performed  No notes on file     PMFS History: Patient Active Problem List   Diagnosis Date Noted  . Pacemaker - MDT 01/24/2020  . Orthostatic lightheadedness 01/24/2020  . Chronic  osteomyelitis involving left ankle and foot ([place])   . Paroxysmal A-fib ([place]) 10/30/2019  . Paroxysmal atrial fibrillation ([place]) 10/25/2019  . Secondary hypercoagulable state ([place]) 10/25/2019  . Numbness and tingling of right leg 10/19/2019  . Lumbar radiculopathy 10/19/2019  . Acute exacerbation of CHF (congestive heart failure) ([place]) 08/13/2019  . COPD with acute exacerbation ([place]) 08/13/2019  . Acute respiratory failure ([place]) 08/13/2019  . Chest pain 08/13/2019  . Leukocytosis 08/13/2019  . Leg swelling 12/09/2018  . Urinary frequency 12/09/2018  . Pain in joint of right hip 01/21/2018  . Myofascial pain syndrome, cervical 01/06/2018  . Occipital neuralgia of right side 01/06/2018  . RUQ abdominal pain   . Gastritis and gastroduodenitis   . Bile duct abnormality   . Cholecystitis 04/30/2016  . Gall bladder disease 04/28/2016  . Sinus node dysfunction ([place]) 11/08/2015  . Ejection fraction   . Spinal stenosis of cervical region 11/07/2014  . Preoperative clearance 10/09/2014  . Carotid artery disease ([place])   . S/P left TKA 07/27/2011  . Dizzy spells 05/04/2011  . Palpitations 01/21/2011  . RHINITIS 07/21/2010  . Hyperlipidemia 11/24/2008  . OBESITY 11/24/2008  . Coronary atherosclerosis 11/24/2008  . VENTRICULAR HYPERTROPHY, LEFT 11/24/2008  . HYPOTHYROIDISM, POSTSURGICAL 06/04/2008  . Headache 06/04/2008  . DM (diabetes mellitus) ([place]) 03/23/2007  . G E R D 03/23/2007  . DIVERTICULOSIS OF COLON 03/23/2007  . GENERALIZED OSTEOARTHROSIS UNSPECIFIED SITE 03/23/2007  .  OBSTRUCTIVE SLEEP APNEA 02/16/2007  . Essential hypertension 02/16/2007  . COPD 02/16/2007   Past Medical History:  Diagnosis Date  . Allergy   . Anxiety   . Arthritis   . CAD (coronary artery disease)    Mild per remote cath in 2006, /    Nuclear, January, 2013, no significant abnormality,  . Carotid artery disease ([place])    Doppler, January, 2013, 0 26% RIC A., 37-85% LICA, stable  . Cataract   . CHF  (congestive heart failure) ([place])   . Complication of anesthesia    wakes up "mean"  . COPD 02/16/2007   Intolerant of Spiriva, tudorza Intolerant of Trelegy   . COPD (chronic obstructive pulmonary disease) ([place])   . Coronary atherosclerosis 11/24/2008   Catheterization 2006, nonobstructive coronary disease   //   nuclear 2013, low risk    . Diabetes mellitus, type 2 ([place])   . Diverticula of colon   . DIVERTICULOSIS OF COLON 03/23/2007   Qualifier: Diagnosis of  By: Halford Chessman MD, Vineet    . Dizziness    occasional  . Dizzy spells 05/04/2011  . DM (diabetes mellitus) ([place]) 03/23/2007   Qualifier: Diagnosis of  By: Halford Chessman MD, Vineet    . Dyspnea    On exertion  . Ejection fraction   . Essential hypertension 02/16/2007   Qualifier: Diagnosis of  By: Rosana Hoes CMA, Tammy    . G E R D 03/23/2007   Qualifier: Diagnosis of  By: Halford Chessman MD, Vineet    . Gall bladder disease 04/28/2016  . Gastritis and gastroduodenitis   . GERD (gastroesophageal reflux disease)   . Goiter    hx of  . Headache(784.0)    migraine  . Hyperlipidemia   . Hypertension   . Hypothyroidism   . HYPOTHYROIDISM, POSTSURGICAL 06/04/2008   Qualifier: Diagnosis of  By: Loanne Drilling MD, Jacelyn Pi   . Left ventricular hypertrophy   . Leg edema    occasional swelling  . Leg swelling 12/09/2018  . Myofascial pain syndrome, cervical 01/06/2018  . Obesity   . OBESITY 11/24/2008   Qualifier: Diagnosis of  By: Sidney Ace    . OBSTRUCTIVE SLEEP APNEA 02/16/2007   Auto CPAP 09/03/13 to 10/02/13 >> used on 30 of 30 nights with average 6 hrs and 3 min.  Average AHI is 3.1 with median CPAP 5 cm H2O and 95 th percentile CPAP 6 cm H2O.    . Occipital neuralgia of right side 01/06/2018  . OSA (obstructive sleep apnea)    cpap setting of 12  . Pain in joint of right hip 01/21/2018  . Palpitations 01/21/2011   48 hour Holter, 2015, scattered PACs and PVCs.   . Pneumonia   . PONV (postoperative nausea and vomiting)    severe after every surgery  .  Pulmonary hypertension, secondary   . RHINITIS 07/21/2010       . RUQ abdominal pain   . S/P left TKA 07/27/2011  . Sinus node dysfunction ([place]) 11/08/2015  . Sleep apnea   . Spinal stenosis of cervical region 11/07/2014  . Urinary frequency 12/09/2018  . UTI (urinary tract infection) 08/15/2019   per pt report  . VENTRICULAR HYPERTROPHY, LEFT 11/24/2008   Qualifier: Diagnosis of  By: Sidney Ace      Family History  Problem Relation Age of Onset  . Heart Problems Mother   . Heart defect Mother        valve problems  . Heart Problems Father   .  Heart disease Brother   . Lung cancer Daughter        non small cell   . Diabetes Maternal Aunt   . Diabetes Maternal Uncle   . Throat cancer Brother   . Colon cancer Neg Hx   . Colon polyps Neg Hx   . Esophageal cancer Neg Hx   . Rectal cancer Neg Hx   . Stomach cancer Neg Hx     Past Surgical History:  Procedure Laterality Date  . ABDOMINAL HYSTERECTOMY     with appendectomy and colon polypectomy  . AMPUTATION TOE Left 12/15/2019   Procedure: AMPUTATION  LEFT GREAT TOE;  Surgeon: Evelina Bucy, DPM;  [facility];  Service: Podiatry;  Laterality: Left;  . ANTERIOR CERVICAL DECOMP/DISCECTOMY FUSION N/A 11/07/2014   Procedure: Anterior Cervical diskectomy and fusion Cervical four-five, Cervical five-six, Cervical six-seven;  Surgeon: Kary Kos, MD;  Location: [facility];  Service: Neurosurgery;  Laterality: N/A;  . APPENDECTOMY    . ARTERY BIOPSY Right 02/16/2017   Procedure: BIOPSY TEMPORAL ARTERY;  Surgeon: Katha Cabal, MD;  Location: [facility] ORS;  Service: Vascular;  Laterality: Right;  . BACK SURGERY    . CARDIAC CATHETERIZATION    . CATARACT EXTRACTION W/ INTRAOCULAR LENS  IMPLANT, BILATERAL Bilateral   . CHOLECYSTECTOMY N/A 04/29/2016   Procedure: LAPAROSCOPIC CHOLECYSTECTOMY WITH INTRAOPERATIVE CHOLANGIOGRAM;  Surgeon: Alphonsa Overall, MD;  [facility];  Service: General;  Laterality: N/A;  . EP IMPLANTABLE DEVICE  N/A 11/08/2015   MRI COMPATABLE -- Procedure: Pacemaker Implant;  Surgeon: Deboraha Sprang, MD;  Location: [facility];  Service: Cardiovascular;  Laterality: N/A;  . INSERT / REPLACE / REMOVE PACEMAKER  2017   Dr. Jens Som [facility])  . JOINT REPLACEMENT    . LUMBAR [place] SURGERY  1990's   x 2  . THYROIDECTOMY  2011  . TOTAL KNEE ARTHROPLASTY  07/27/2011   Procedure: TOTAL KNEE ARTHROPLASTY;  Surgeon: Mauri Pole, MD;  [facility];  Service: Orthopedics;  Laterality: Left;  . UPPER ESOPHAGEAL ENDOSCOPIC ULTRASOUND (EUS) N/A 05/14/2016   Procedure: UPPER ESOPHAGEAL ENDOSCOPIC ULTRASOUND (EUS);  Surgeon: Milus Banister, MD;  Location: [facility];  Service: Endoscopy;  Laterality: N/A;  radial only-will be done mod if needed   Social History   Occupational History  . Occupation: retired  Tobacco Use  . Smoking status: Former Smoker    Packs/day: 2.00    Years: 33.00    Pack years: 66.00    Types: Cigarettes    Start date: 1966    Quit date: 05/04/1997    Years since quitting: 22.8  . Smokeless tobacco: Never Used  Vaping Use  . Vaping Use: Never used  Substance and Sexual Activity  . Alcohol use: No  . Drug use: No  . Sexual activity: Not on file

## 2020-03-05 ENCOUNTER — Ambulatory Visit (INDEPENDENT_AMBULATORY_CARE_PROVIDER_SITE_OTHER): Payer: Medicare HMO | Admitting: Podiatry

## 2020-03-05 ENCOUNTER — Ambulatory Visit: Payer: Medicare HMO | Admitting: Orthotics

## 2020-03-05 ENCOUNTER — Other Ambulatory Visit: Payer: Self-pay

## 2020-03-05 DIAGNOSIS — E1142 Type 2 diabetes mellitus with diabetic polyneuropathy: Secondary | ICD-10-CM

## 2020-03-05 DIAGNOSIS — E11621 Type 2 diabetes mellitus with foot ulcer: Secondary | ICD-10-CM

## 2020-03-05 DIAGNOSIS — L97529 Non-pressure chronic ulcer of other part of left foot with unspecified severity: Secondary | ICD-10-CM

## 2020-03-05 DIAGNOSIS — Z89412 Acquired absence of left great toe: Secondary | ICD-10-CM

## 2020-03-05 DIAGNOSIS — B351 Tinea unguium: Secondary | ICD-10-CM

## 2020-03-05 DIAGNOSIS — L97522 Non-pressure chronic ulcer of other part of left foot with fat layer exposed: Secondary | ICD-10-CM

## 2020-03-05 DIAGNOSIS — E1169 Type 2 diabetes mellitus with other specified complication: Secondary | ICD-10-CM

## 2020-03-05 NOTE — Progress Notes (Signed)
  Subjective:  Patient ID: Kayla [name], female    DOB: 08-19-1940,  MRN: [id number]  Chief Complaint  Patient presents with  . Routine Post Op    Pt states healing well post amputation, no concerns.  . Nail Problem    Nail trim bilateral feet    DOS: 12/15/19 Procedure: Left great toe partial amputation   79 y.o. female presents with the above complaint. History confirmed with patient.   Objective:  Physical Exam: no tenderness at the surgical site. Incision: well healed without ulceration Onychomycosis of remaining digits. Good warmth to foot. Protective sensation absent.   Assessment:   1. Skin ulcer of left great toe with fat layer exposed ([place])   2. Onychomycosis of multiple toenails with type 2 diabetes mellitus and peripheral neuropathy ([place])     Plan:  Patient was evaluated and treated and all questions answered.  S/p Left partial hallux amputation -Well healed  Diabetes with DPN, Onychomycosis -Educated on diabetic footcare. Diabetic risk level 3 -Nails x9 debrided sharply and manually with large nail nipper and rotary burr.   Procedure: Nail Debridement Rationale: Patient meets criteria for routine foot care due to DPN Type of Debridement: manual, sharp debridement. Instrumentation: Nail nipper, rotary burr. Number of Nails: 9  No follow-ups on file.

## 2020-03-08 ENCOUNTER — Telehealth: Payer: Self-pay

## 2020-03-08 DIAGNOSIS — J181 Lobar pneumonia, unspecified organism: Secondary | ICD-10-CM | POA: Diagnosis not present

## 2020-03-08 DIAGNOSIS — Z6837 Body mass index (BMI) 37.0-37.9, adult: Secondary | ICD-10-CM | POA: Diagnosis not present

## 2020-03-08 DIAGNOSIS — J449 Chronic obstructive pulmonary disease, unspecified: Secondary | ICD-10-CM | POA: Diagnosis not present

## 2020-03-08 NOTE — Telephone Encounter (Signed)
Spoke with pt's husband, Agustina Caroli and advised pt's labs are much improved with normal K+ and improved kidney function.  Pt's husband verbalizes understanding and thanked Therapist, sports for phone call.

## 2020-03-08 NOTE — Telephone Encounter (Signed)
-----   Message from Deboraha Sprang, MD sent at 02/27/2020  7:20 PM EDT ----- Please Inform Patient that labs are mcuh improved with normal K level and improved renal function  Thanks

## 2020-03-12 ENCOUNTER — Ambulatory Visit (HOSPITAL_COMMUNITY): Payer: Medicare HMO | Admitting: Physician Assistant

## 2020-03-18 NOTE — Progress Notes (Signed)

## 2020-03-21 ENCOUNTER — Other Ambulatory Visit: Payer: Self-pay

## 2020-03-21 ENCOUNTER — Ambulatory Visit (INDEPENDENT_AMBULATORY_CARE_PROVIDER_SITE_OTHER): Payer: Medicare HMO | Admitting: Family Medicine

## 2020-03-21 ENCOUNTER — Encounter: Payer: Self-pay | Admitting: Family Medicine

## 2020-03-21 ENCOUNTER — Ambulatory Visit (INDEPENDENT_AMBULATORY_CARE_PROVIDER_SITE_OTHER): Payer: Medicare HMO

## 2020-03-21 DIAGNOSIS — M25551 Pain in right hip: Secondary | ICD-10-CM

## 2020-03-21 DIAGNOSIS — S82141D Displaced bicondylar fracture of right tibia, subsequent encounter for closed fracture with routine healing: Secondary | ICD-10-CM | POA: Diagnosis not present

## 2020-03-21 DIAGNOSIS — I495 Sick sinus syndrome: Secondary | ICD-10-CM

## 2020-03-21 DIAGNOSIS — M25571 Pain in right ankle and joints of right foot: Secondary | ICD-10-CM | POA: Diagnosis not present

## 2020-03-21 MED ORDER — OXYCODONE-ACETAMINOPHEN 10-325 MG PO TABS: 0.5000 | ORAL_TABLET | Freq: Every evening | ORAL | 0 refills | Status: DC | PRN

## 2020-03-21 NOTE — Progress Notes (Signed)
Office Visit Note   Patient: Kayla [name]           Date of Birth: Oct 24, 1940           MRN: [id number] Visit Date: 03/21/2020 Requested by: [name], MD [place],  [place] [address] PCP: [name], MD  Subjective: Chief Complaint  Patient presents with  . Right Knee - Fracture, Follow-up    6 weeks post tibial plateau fracture. Having pain in the right ankle and lateral/posterior right hip --- she asks if this comes from the twisting motion when she fell vs coming from the fracture site. Not wearing the immobilizer today.    HPI: She is about 6-week status post fall resulting in right knee lateral tibial plateau fracture.  Since last visit her symptoms have improved around the knee.  She has pain primarily in the right lateral hip, some pain just above the knee, and some pain in the lower leg toward the ankle but she does not think it is related.  She has discomfort when lying on her right side at night.  She is not wearing her knee immobilizer consistently.  She is not requiring medication like she was before.                ROS:   All other systems were reviewed and are negative.  Objective: Vital Signs: There were no vitals taken for this visit.  Physical Exam:  General:  Alert and oriented, in no acute distress. Pulm:  Breathing unlabored. Psy:  Normal mood, congruent affect.  Right leg: She has some tenderness over the greater trochanter of the hip.  No significant pain with passive hip internal rotation.  The right knee has no effusion today.  There is some tenderness in the distal quadriceps, but no tenderness along the tibial plateau today.  Right ankle is not significantly tender to palpation.    Imaging: XR Knee 1-2 Views Right  Result Date: 03/21/2020 X-rays of the right knee reveal unchanged alignment, I believe she has callus formation at the lateral tibial plateau in the region of her nondisplaced fracture.  There is no sign of  collapse.  Moderate degenerative changes noted.   Assessment & Plan: 1.  Clinically healing 6-week status post fall resulting in right lateral tibial plateau fracture. -Okay to begin weightbearing as tolerated using a Rollator walker.  Prescription for that given today. -Anticipate 3-6 more weeks healing time.  As long as she becomes pain-free I will see her back as needed. -1 more prescription for oxycodone to take only at night for severe pain.  2.  Right hip and lower leg pain -Leg raises and stretching exercises given to do at home.  If those symptoms persist, PT referral.      Procedures: No procedures performed  No notes on file     PMFS History: Patient Active Problem List   Diagnosis Date Noted  . Pacemaker - MDT 01/24/2020  . Orthostatic lightheadedness 01/24/2020  . Chronic osteomyelitis involving left ankle and foot ([place])   . Paroxysmal A-fib ([place]) 10/30/2019  . Paroxysmal atrial fibrillation ([place]) 10/25/2019  . Secondary hypercoagulable state ([place]) 10/25/2019  . Numbness and tingling of right leg 10/19/2019  . Lumbar radiculopathy 10/19/2019  . Acute exacerbation of CHF (congestive heart failure) ([place]) 08/13/2019  . COPD with acute exacerbation ([place]) 08/13/2019  . Acute respiratory failure ([place]) 08/13/2019  . Chest pain 08/13/2019  . Leukocytosis 08/13/2019  . Leg swelling  12/09/2018  . Urinary frequency 12/09/2018  . Pain in joint of right hip 01/21/2018  . Myofascial pain syndrome, cervical 01/06/2018  . Occipital neuralgia of right side 01/06/2018  . RUQ abdominal pain   . Gastritis and gastroduodenitis   . Bile duct abnormality   . Cholecystitis 04/30/2016  . Gall bladder disease 04/28/2016  . Sinus node dysfunction ([place]) 11/08/2015  . Ejection fraction   . Spinal stenosis of cervical region 11/07/2014  . Preoperative clearance 10/09/2014  . Carotid artery disease ([place])   . S/P left TKA 07/27/2011  . Dizzy spells 05/04/2011  . Palpitations  01/21/2011  . RHINITIS 07/21/2010  . Hyperlipidemia 11/24/2008  . OBESITY 11/24/2008  . Coronary atherosclerosis 11/24/2008  . VENTRICULAR HYPERTROPHY, LEFT 11/24/2008  . HYPOTHYROIDISM, POSTSURGICAL 06/04/2008  . Headache 06/04/2008  . DM (diabetes mellitus) ([place]) 03/23/2007  . G E R D 03/23/2007  . DIVERTICULOSIS OF COLON 03/23/2007  . GENERALIZED OSTEOARTHROSIS UNSPECIFIED SITE 03/23/2007  . OBSTRUCTIVE SLEEP APNEA 02/16/2007  . Essential hypertension 02/16/2007  . COPD 02/16/2007   Past Medical History:  Diagnosis Date  . Allergy   . Anxiety   . Arthritis   . CAD (coronary artery disease)    Mild per remote cath in 2006, /    Nuclear, January, 2013, no significant abnormality,  . Carotid artery disease ([place])    Doppler, January, 2013, 0 86% RIC A., 76-19% LICA, stable  . Cataract   . CHF (congestive heart failure) ([place])   . Complication of anesthesia    wakes up "mean"  . COPD 02/16/2007   Intolerant of Spiriva, tudorza Intolerant of Trelegy   . COPD (chronic obstructive pulmonary disease) ([place])   . Coronary atherosclerosis 11/24/2008   Catheterization 2006, nonobstructive coronary disease   //   nuclear 2013, low risk    . Diabetes mellitus, type 2 ([place])   . Diverticula of colon   . DIVERTICULOSIS OF COLON 03/23/2007   Qualifier: Diagnosis of  By: Halford Chessman MD, Vineet    . Dizziness    occasional  . Dizzy spells 05/04/2011  . DM (diabetes mellitus) ([place]) 03/23/2007   Qualifier: Diagnosis of  By: Halford Chessman MD, Vineet    . Dyspnea    On exertion  . Ejection fraction   . Essential hypertension 02/16/2007   Qualifier: Diagnosis of  By: Rosana Hoes CMA, Tammy    . G E R D 03/23/2007   Qualifier: Diagnosis of  By: Halford Chessman MD, Vineet    . Gall bladder disease 04/28/2016  . Gastritis and gastroduodenitis   . GERD (gastroesophageal reflux disease)   . Goiter    hx of  . Headache(784.0)    migraine  . Hyperlipidemia   . Hypertension   . Hypothyroidism   . HYPOTHYROIDISM,  POSTSURGICAL 06/04/2008   Qualifier: Diagnosis of  By: Loanne Drilling MD, Jacelyn Pi   . Left ventricular hypertrophy   . Leg edema    occasional swelling  . Leg swelling 12/09/2018  . Myofascial pain syndrome, cervical 01/06/2018  . Obesity   . OBESITY 11/24/2008   Qualifier: Diagnosis of  By: Sidney Ace    . OBSTRUCTIVE SLEEP APNEA 02/16/2007   Auto CPAP 09/03/13 to 10/02/13 >> used on 30 of 30 nights with average 6 hrs and 3 min.  Average AHI is 3.1 with median CPAP 5 cm H2O and 95 th percentile CPAP 6 cm H2O.    . Occipital neuralgia of right side 01/06/2018  . OSA (obstructive sleep apnea)  cpap setting of 12  . Pain in joint of right hip 01/21/2018  . Palpitations 01/21/2011   48 hour Holter, 2015, scattered PACs and PVCs.   . Pneumonia   . PONV (postoperative nausea and vomiting)    severe after every surgery  . Pulmonary hypertension, secondary   . RHINITIS 07/21/2010       . RUQ abdominal pain   . S/P left TKA 07/27/2011  . Sinus node dysfunction ([place]) 11/08/2015  . Sleep apnea   . Spinal stenosis of cervical region 11/07/2014  . Urinary frequency 12/09/2018  . UTI (urinary tract infection) 08/15/2019   per pt report  . VENTRICULAR HYPERTROPHY, LEFT 11/24/2008   Qualifier: Diagnosis of  By: Sidney Ace      Family History  Problem Relation Age of Onset  . Heart Problems Mother   . Heart defect Mother        valve problems  . Heart Problems Father   . Heart disease Brother   . Lung cancer Daughter        non small cell   . Diabetes Maternal Aunt   . Diabetes Maternal Uncle   . Throat cancer Brother   . Colon cancer Neg Hx   . Colon polyps Neg Hx   . Esophageal cancer Neg Hx   . Rectal cancer Neg Hx   . Stomach cancer Neg Hx     Past Surgical History:  Procedure Laterality Date  . ABDOMINAL HYSTERECTOMY     with appendectomy and colon polypectomy  . AMPUTATION TOE Left 12/15/2019   Procedure: AMPUTATION  LEFT GREAT TOE;  Surgeon: Evelina Bucy, DPM;  Location: [facility];   Service: Podiatry;  Laterality: Left;  . ANTERIOR CERVICAL DECOMP/DISCECTOMY FUSION N/A 11/07/2014   Procedure: Anterior Cervical diskectomy and fusion Cervical four-five, Cervical five-six, Cervical six-seven;  Surgeon: Kary Kos, MD;  Location: [facility];  Service: Neurosurgery;  Laterality: N/A;  . APPENDECTOMY    . ARTERY BIOPSY Right 02/16/2017   Procedure: BIOPSY TEMPORAL ARTERY;  Surgeon: Katha Cabal, MD;  Location: [facility];  Service: Vascular;  Laterality: Right;  . BACK SURGERY    . CARDIAC CATHETERIZATION    . CATARACT EXTRACTION W/ INTRAOCULAR LENS  IMPLANT, BILATERAL Bilateral   . CHOLECYSTECTOMY N/A 04/29/2016   Procedure: LAPAROSCOPIC CHOLECYSTECTOMY WITH INTRAOPERATIVE CHOLANGIOGRAM;  Surgeon: Alphonsa Overall, MD;  [facility];  Service: General;  Laterality: N/A;  . EP IMPLANTABLE DEVICE N/A 11/08/2015   MRI COMPATABLE -- Procedure: Pacemaker Implant;  Surgeon: Deboraha Sprang, MD;  Location: [facility];  Service: Cardiovascular;  Laterality: N/A;  . INSERT / REPLACE / REMOVE PACEMAKER  2017   Dr. Jens Som [facility]  . JOINT REPLACEMENT    . LUMBAR DISC SURGERY  1990's   x 2  . THYROIDECTOMY  2011  . TOTAL KNEE ARTHROPLASTY  07/27/2011   Procedure: TOTAL KNEE ARTHROPLASTY;  Surgeon: Mauri Pole, MD;  [facility];  Service: Orthopedics;  Laterality: Left;  . UPPER ESOPHAGEAL ENDOSCOPIC ULTRASOUND (EUS) N/A 05/14/2016   Procedure: UPPER ESOPHAGEAL ENDOSCOPIC ULTRASOUND (EUS);  Surgeon: Milus Banister, MD;  Location: [facility];  Service: Endoscopy;  Laterality: N/A;  radial only-will be done mod if needed   Social History   Occupational History  . Occupation: retired  Tobacco Use  . Smoking status: Former Smoker    Packs/day: 2.00    Years: 33.00    Pack years: 66.00    Types: Cigarettes  Start date: 13    Quit date: 05/04/1997    Years since quitting: 22.8  . Smokeless tobacco: Never Used  Vaping Use  . Vaping Use: Never used   Substance and Sexual Activity  . Alcohol use: No  . Drug use: No  . Sexual activity: Not on file

## 2020-03-23 LAB — CUP PACEART REMOTE DEVICE CHECK
Battery Remaining Longevity: 64 mo
Battery Voltage: 3 V
Brady Statistic AP VP Percent: 0.45 %
Brady Statistic AP VS Percent: 80.13 %
Brady Statistic AS VP Percent: 7.41 %
Brady Statistic AS VS Percent: 12 %
Brady Statistic RA Percent Paced: 71.9 %
Brady Statistic RV Percent Paced: 8.09 %
Date Time Interrogation Session: 20211119160534
Implantable Lead Implant Date: 20170707
Implantable Lead Implant Date: 20170707
Implantable Lead Location: 753859
Implantable Lead Location: 753860
Implantable Lead Model: 3830
Implantable Lead Model: 5076
Implantable Pulse Generator Implant Date: 20170707
Lead Channel Impedance Value: 285 Ohm
Lead Channel Impedance Value: 380 Ohm
Lead Channel Impedance Value: 380 Ohm
Lead Channel Impedance Value: 418 Ohm
Lead Channel Pacing Threshold Amplitude: 0.875 V
Lead Channel Pacing Threshold Amplitude: 1.875 V
Lead Channel Pacing Threshold Pulse Width: 0.4 ms
Lead Channel Pacing Threshold Pulse Width: 0.4 ms
Lead Channel Sensing Intrinsic Amplitude: 4.625 mV
Lead Channel Sensing Intrinsic Amplitude: 4.625 mV
Lead Channel Sensing Intrinsic Amplitude: 6.125 mV
Lead Channel Sensing Intrinsic Amplitude: 6.125 mV
Lead Channel Setting Pacing Amplitude: 1.75 V
Lead Channel Setting Pacing Amplitude: 2.5 V
Lead Channel Setting Pacing Pulse Width: 1 ms
Lead Channel Setting Sensing Sensitivity: 2.8 mV

## 2020-03-25 NOTE — Progress Notes (Signed)
Remote pacemaker transmission.   

## 2020-04-02 ENCOUNTER — Encounter: Payer: Medicare HMO | Admitting: Internal Medicine

## 2020-04-09 ENCOUNTER — Ambulatory Visit: Payer: Medicare HMO | Admitting: Internal Medicine

## 2020-04-09 ENCOUNTER — Encounter: Payer: Self-pay | Admitting: Internal Medicine

## 2020-04-09 ENCOUNTER — Other Ambulatory Visit: Payer: Self-pay

## 2020-04-09 VITALS — BP 124/70 | HR 65 | Ht 66.0 in | Wt 231.6 lb

## 2020-04-09 DIAGNOSIS — M353 Polymyalgia rheumatica: Secondary | ICD-10-CM | POA: Diagnosis not present

## 2020-04-09 DIAGNOSIS — Z950 Presence of cardiac pacemaker: Secondary | ICD-10-CM | POA: Diagnosis not present

## 2020-04-09 DIAGNOSIS — I495 Sick sinus syndrome: Secondary | ICD-10-CM | POA: Diagnosis not present

## 2020-04-09 DIAGNOSIS — I480 Paroxysmal atrial fibrillation: Secondary | ICD-10-CM

## 2020-04-09 DIAGNOSIS — D649 Anemia, unspecified: Secondary | ICD-10-CM | POA: Diagnosis not present

## 2020-04-09 NOTE — Patient Instructions (Signed)
Medication Instructions:  Your physician recommends that you continue on your current medications as directed. Please refer to the Current Medication list given to you today.  *If you need a refill on your cardiac medications before your next appointment, please call your pharmacy*   Lab Work: CBC, ESR and CRP today  If you have labs (blood work) drawn today and your tests are completely normal, you will receive your results only by: Marland Kitchen MyChart Message (if you have MyChart) OR . A paper copy in the mail If you have any lab test that is abnormal or we need to change your treatment, we will call you to review the results.   Testing/Procedures: None ordered.    Follow-Up: At [facility], you and your health needs are our priority.  As part of our continuing mission to provide you with exceptional heart care, we have created designated Provider Care Teams.  These Care Teams include your primary Cardiologist (physician) and Advanced Practice Providers (APPs -  Physician Assistants and Nurse Practitioners) who all work together to provide you with the care you need, when you need it.  We recommend signing up for the patient portal called "MyChart".  Sign up information is provided on this After Visit Summary.  MyChart is used to connect with patients for Virtual Visits (Telemedicine).  Patients are able to view lab/test results, encounter notes, upcoming appointments, etc.  Non-urgent messages can be sent to your provider as well.   To learn more about what you can do with MyChart, go to [email or web address].    Your next appointment:   3 months with Dr Caryl Comes

## 2020-04-09 NOTE — Progress Notes (Signed)
Patient Care Team: Hamrick, Lorin Mercy, MD as PCP - General (Family Medicine) Jerline Pain, MD as PCP - Cardiology (Cardiology) Milus Banister, MD as Consulting Physician (Gastroenterology)   HPI  Kayla [name] is a 79 y.o. female Seen in follow-up for pacemaker implanted (7/17)  for sinus bradycardia and atrial fibrillation resistant to dofetilide and assoc with RVR   in the past have considered AV junction ablation  Nonobstructive coronary artery disease by catheterization 2006 with a low risk Myoview 2013.      Dofetilide initiated 7/21 but discontinued because of lack of effectiveness.  Has had discussions regarding amiodarone and AV ablation as well as the possibility of PVI... Better in sinus. On apixaban.  Continues with recurrent symptomatic atrial fibrillation with a modestly rapid ventricular response, the latter improved following up titration of her metoprolol.  Biggest complaint right now is pain in her thighs and shoulders bilaterally.  Also struggling with neuropathy pain in her legs bilaterally  Unaware of her anemia issue (See Below) has been started on iron not withstanding normal iron studies 4/21      DATE TEST EF   07/23/2022 Echo   60-65 %   8/19 CTA    % LAD,CX,RCA diffuse FFR ( distal OM2) 0.78  4/21 Echo 60-65% LVH (45mm), RAE LA normal     Date Cr K Mg Hgb  7/19 1.24 4.0    8/21 1.91<<1.40 5.0 2.0 9.1  10/21 1.32 4.5     4/21 Ferritin  28  B12 659     Past Medical History:  Diagnosis Date  . Allergy   . Anxiety   . Arthritis   . CAD (coronary artery disease)    Mild per remote cath in 2006, /    Nuclear, January, 2013, no significant abnormality,  . Carotid artery disease ([place])    Doppler, January, 2013, 0 76% RIC A., 19-50% LICA, stable  . Cataract   . CHF (congestive heart failure) ([place])   . Complication of anesthesia    wakes up "mean"  . COPD 02/16/2007   Intolerant of Spiriva, tudorza Intolerant of Trelegy   . COPD  (chronic obstructive pulmonary disease) ([place])   . Coronary atherosclerosis 11/24/2008   Catheterization 2006, nonobstructive coronary disease   //   nuclear 2013, low risk    . Diabetes mellitus, type 2 ([place])   . Diverticula of colon   . DIVERTICULOSIS OF COLON 03/23/2007   Qualifier: Diagnosis of  By: Halford Chessman MD, Vineet    . Dizziness    occasional  . Dizzy spells 05/04/2011  . DM (diabetes mellitus) ([place]) 03/23/2007   Qualifier: Diagnosis of  By: Halford Chessman MD, Vineet    . Dyspnea    On exertion  . Ejection fraction   . Essential hypertension 02/16/2007   Qualifier: Diagnosis of  By: Rosana Hoes CMA, Tammy    . G E R D 03/23/2007   Qualifier: Diagnosis of  By: Halford Chessman MD, Vineet    . Gall bladder disease 04/28/2016  . Gastritis and gastroduodenitis   . GERD (gastroesophageal reflux disease)   . Goiter    hx of  . Headache(784.0)    migraine  . Hyperlipidemia   . Hypertension   . Hypothyroidism   . HYPOTHYROIDISM, POSTSURGICAL 06/04/2008   Qualifier: Diagnosis of  By: Loanne Drilling MD, Jacelyn Pi   . Left ventricular hypertrophy   . Leg edema    occasional swelling  . Leg swelling 12/09/2018  .  Myofascial pain syndrome, cervical 01/06/2018  . Obesity   . OBESITY 11/24/2008   Qualifier: Diagnosis of  By: Sidney Ace    . OBSTRUCTIVE SLEEP APNEA 02/16/2007   Auto CPAP 09/03/13 to 10/02/13 >> used on 30 of 30 nights with average 6 hrs and 3 min.  Average AHI is 3.1 with median CPAP 5 cm H2O and 95 th percentile CPAP 6 cm H2O.    . Occipital neuralgia of right side 01/06/2018  . OSA (obstructive sleep apnea)    cpap setting of 12  . Pain in joint of right hip 01/21/2018  . Palpitations 01/21/2011   48 hour Holter, 2015, scattered PACs and PVCs.   . Pneumonia   . PONV (postoperative nausea and vomiting)    severe after every surgery  . Pulmonary hypertension, secondary   . RHINITIS 07/21/2010       . RUQ abdominal pain   . S/P left TKA 07/27/2011  . Sinus node dysfunction ([place]) 11/08/2015  . Sleep apnea    . Spinal stenosis of cervical region 11/07/2014  . Urinary frequency 12/09/2018  . UTI (urinary tract infection) 08/15/2019   per pt report  . VENTRICULAR HYPERTROPHY, LEFT 11/24/2008   Qualifier: Diagnosis of  By: Sidney Ace      Past Surgical History:  Procedure Laterality Date  . ABDOMINAL HYSTERECTOMY     with appendectomy and colon polypectomy  . AMPUTATION TOE Left 12/15/2019   Procedure: AMPUTATION  LEFT GREAT TOE;  Surgeon: Evelina Bucy, DPM;  [facility];  Service: Podiatry;  Laterality: Left;  . ANTERIOR CERVICAL DECOMP/DISCECTOMY FUSION N/A 11/07/2014   Procedure: Anterior Cervical diskectomy and fusion Cervical four-five, Cervical five-six, Cervical six-seven;  Surgeon: Kary Kos, MD;  Location: [facility];  Service: Neurosurgery;  Laterality: N/A;  . APPENDECTOMY    . ARTERY BIOPSY Right 02/16/2017   Procedure: BIOPSY TEMPORAL ARTERY;  Surgeon: Katha Cabal, MD;  Location: [facility];  Service: Vascular;  Laterality: Right;  . BACK SURGERY    . CARDIAC CATHETERIZATION    . CATARACT EXTRACTION W/ INTRAOCULAR LENS  IMPLANT, BILATERAL Bilateral   . CHOLECYSTECTOMY N/A 04/29/2016   Procedure: LAPAROSCOPIC CHOLECYSTECTOMY WITH INTRAOPERATIVE CHOLANGIOGRAM;  Surgeon: Alphonsa Overall, MD;  [facility];  Service: General;  Laterality: N/A;  . EP IMPLANTABLE DEVICE N/A 11/08/2015   MRI COMPATABLE -- Procedure: Pacemaker Implant;  Surgeon: Deboraha Sprang, MD;  Location: [facility];  Service: Cardiovascular;  Laterality: N/A;  . INSERT / REPLACE / REMOVE PACEMAKER  2017   Dr. Jens Som [facility])  . JOINT REPLACEMENT    . LUMBAR DISC SURGERY  1990's   x 2  . THYROIDECTOMY  2011  . TOTAL KNEE ARTHROPLASTY  07/27/2011   Procedure: TOTAL KNEE ARTHROPLASTY;  Surgeon: Mauri Pole, MD;  [facility];  Service: Orthopedics;  Laterality: Left;  . UPPER ESOPHAGEAL ENDOSCOPIC ULTRASOUND (EUS) N/A 05/14/2016   Procedure: UPPER ESOPHAGEAL ENDOSCOPIC ULTRASOUND (EUS);   Surgeon: Milus Banister, MD;  Location: [facility];  Service: Endoscopy;  Laterality: N/A;  radial only-will be done mod if needed    Current Outpatient Medications  Medication Sig Dispense Refill  . acetaminophen (TYLENOL) 500 MG tablet Take 1,000 mg by mouth every 6 (six) hours as needed for moderate pain (for headaches).     Marland Kitchen albuterol (PROVENTIL) (2.5 MG/3ML) 0.083% nebulizer solution Take 2.5 mg by nebulization every 2 (two) hours as needed for wheezing or shortness of breath.     Marland Kitchen  albuterol (VENTOLIN HFA) 108 (90 Base) MCG/ACT inhaler Inhale 2 puffs into the lungs every 6 (six) hours as needed for wheezing or shortness of breath. 3 Inhaler 3  . Alcohol Swabs (B-D SINGLE USE SWABS REGULAR) PADS     . allopurinol (ZYLOPRIM) 100 MG tablet Take 100 mg by mouth 2 (two) times daily.     Marland Kitchen atorvastatin (LIPITOR) 40 MG tablet Take 1 tablet (40 mg total) by mouth daily. 90 tablet 3  . Blood Glucose Calibration (TRUE METRIX LEVEL 1) Low SOLN     . budesonide-formoterol (SYMBICORT) 160-4.5 MCG/ACT inhaler Inhale 2 puffs into the lungs 2 (two) times daily for 1 day. 3 Inhaler 3  . Cholecalciferol (VITAMIN D-3 PO) Take 1 capsule by mouth daily with breakfast.    . Dextromethorphan-guaiFENesin (TUSSIN DM MAX SUGAR-FREE PO) Take 5 mLs by mouth every 4 (four) hours as needed (for coughing).    Marland Kitchen ELIQUIS 5 MG TABS tablet TAKE 1 TABLET TWICE DAILY 180 tablet 2  . ferrous sulfate 325 (65 FE) MG tablet Take 1 tablet (325 mg total) by mouth daily with breakfast. 90 tablet 3  . fluticasone (FLONASE) 50 MCG/ACT nasal spray USE 2 SPRAYS NASALLY DAILY 48 g 1  . gabapentin (NEURONTIN) 800 MG tablet Take 800 mg by mouth See admin instructions. Take 800 mg by mouth three to four times a day    . HYDROcodone-acetaminophen (NORCO/VICODIN) 5-325 MG tablet Take 1-2 tablets by mouth every 6 (six) hours as needed for moderate pain. 30 tablet 0  . insulin aspart (NOVOLOG FLEXPEN) 100 UNIT/ML FlexPen Inject 30-70 Units  into the skin See admin instructions. Inject 30-70 units into the skin three times a day with meals, per sliding scale (and an additional 1 unit for every 50 points for a BGL >130) Sliding scale    . insulin glargine (LANTUS) 100 UNIT/ML injection Inject 130 Units into the skin in the morning and at bedtime.     Marland Kitchen levothyroxine (SYNTHROID) 175 MCG tablet Take 175 mcg by mouth daily before breakfast.     . LORazepam (ATIVAN) 2 MG tablet Take 2 mg by mouth at bedtime as needed for anxiety.     Marland Kitchen losartan (COZAAR) 50 MG tablet Take 1 tablet (50 mg total) by mouth daily. 90 tablet 3  . magnesium oxide (MAG-OX) 400 (241.3 Mg) MG tablet Take 400 mg by mouth 3 (three) times daily.     . metoprolol succinate (TOPROL-XL) 100 MG 24 hr tablet Take 1 tablet (100 mg total) by mouth daily. Take with or immediately following a meal. 90 tablet 3  . nitroGLYCERIN (NITROSTAT) 0.4 MG SL tablet Place 1 tablet (0.4 mg total) under the tongue every 5 (five) minutes as needed for chest pain. 25 tablet 4  . PRESCRIPTION MEDICATION CPAP: At bedtime and "as needed for shortness of breath"    . promethazine (PHENADOZ) 25 MG suppository Place 25 mg rectally daily as needed (for headaches).     . Tiotropium Bromide Monohydrate (SPIRIVA RESPIMAT) 2.5 MCG/ACT AERS Inhale 2 puffs into the lungs daily. 12 g 3  . torsemide (DEMADEX) 20 MG tablet Take 2 tablets (40 mg total) by mouth every other day. 90 tablet 3  . TRUE METRIX BLOOD GLUCOSE TEST test strip     . TRUEplus Lancets 28G MISC     . vitamin B-12 (CYANOCOBALAMIN) 1000 MCG tablet Take 1,000 mcg by mouth daily.     No current facility-administered medications for this visit.    Allergies  Allergen Reactions  . Dulaglutide Nausea Only, Other (See Comments) and Cough    Made the patient sick to her stomach (only) several days after using it Trulicity      Review of Systems negative except from HPI and PMH  Physical Exam BP 124/70   Pulse 65   Ht 5\' 6"  (1.676 m)    Wt 231 lb 9.6 oz (105.1 kg)   SpO2 96%   BMI 37.38 kg/m  Well developed and Morbidly obese  in no acute distress HENT normal Neck supple with JVP-flat Clear Device pocket well healed; without hematoma or erythema.  There is no tethering  Regular rate and rhythm, no  gallop No murmur Abd-soft with active BS No Clubbing cyanosis tr edema Skin-warm and dry A & Oriented  Grossly normal sensory and motor function  ECG atrial pacing at 65 Interval 21/08/42   Assessment and Plan  Sinus node dysfunction   Atrial fibrillation-paroxysmal  High risk medication surveillance-dofetilide  Pacemaker-Medtronic     Proximal girdle pain  COPD  OSA treated   Hypertension  HFpEF  Anemia   Atrial fibrillation continues poorly controlled.  Options include AV junction ablation, antiarrhythmic therapy and we have reviewed again amiodarone with his list of side effects, as well as possible PVI.  However, given the major complaints today been girdle discomfort and neuropathy pain we have chosen to defer this for about 3 months.  As relates to the latter, we will check a sed rate and a CRP looking for evidence of polymyalgia rheumatica.  I have asked her to follow-up with her PCP regarding alternative therapies for her neuropathy.  No bleeding on her anticoagulation.  I am not sure however that we have an explanation for her anemia given the iron studies done in April.  I have asked her to follow-up with her PCP regarding this as well as possible hematology referral     Current medicines are reviewed at length with the patient today .  The patient does not  have concerns regarding medicines.

## 2020-04-10 LAB — CBC
Hematocrit: 38.8 % (ref 34.0–46.6)
Hemoglobin: 12.5 g/dL (ref 11.1–15.9)
MCH: 27.7 pg (ref 26.6–33.0)
MCHC: 32.2 g/dL (ref 31.5–35.7)
MCV: 86 fL (ref 79–97)
Platelets: 429 10*3/uL (ref 150–450)
RBC: 4.52 x10E6/uL (ref 3.77–5.28)
RDW: 16.9 % — ABNORMAL HIGH (ref 11.7–15.4)
WBC: 10.6 10*3/uL (ref 3.4–10.8)

## 2020-04-10 LAB — HIGH SENSITIVITY CRP: CRP, High Sensitivity: 4.35 mg/L — ABNORMAL HIGH (ref 0.00–3.00)

## 2020-04-10 LAB — SEDIMENTATION RATE: Sed Rate: 105 mm/hr — ABNORMAL HIGH (ref 0–40)

## 2020-04-11 ENCOUNTER — Telehealth: Payer: Self-pay | Admitting: Family Medicine

## 2020-04-11 NOTE — Telephone Encounter (Signed)
Patient called advised [facility] said they need the DX code and ICD10 code faxed to them in order for patient to get the [employer]. The fax # is [phone]   Patient asked for a call back as soon as possible    [phone] or [phone]

## 2020-04-12 DIAGNOSIS — R700 Elevated erythrocyte sedimentation rate: Secondary | ICD-10-CM | POA: Diagnosis not present

## 2020-04-12 DIAGNOSIS — Z6837 Body mass index (BMI) 37.0-37.9, adult: Secondary | ICD-10-CM | POA: Diagnosis not present

## 2020-04-12 DIAGNOSIS — M2669 Other specified disorders of temporomandibular joint: Secondary | ICD-10-CM | POA: Diagnosis not present

## 2020-04-12 DIAGNOSIS — H538 Other visual disturbances: Secondary | ICD-10-CM | POA: Diagnosis not present

## 2020-04-12 DIAGNOSIS — R519 Headache, unspecified: Secondary | ICD-10-CM | POA: Diagnosis not present

## 2020-04-12 DIAGNOSIS — M353 Polymyalgia rheumatica: Secondary | ICD-10-CM | POA: Diagnosis not present

## 2020-04-12 LAB — CUP PACEART INCLINIC DEVICE CHECK
Battery Remaining Longevity: 62 mo
Battery Voltage: 3 V
Brady Statistic AP VP Percent: 0.43 %
Brady Statistic AP VS Percent: 82.21 %
Brady Statistic AS VP Percent: 6.78 %
Brady Statistic AS VS Percent: 10.58 %
Brady Statistic RA Percent Paced: 74.34 %
Brady Statistic RV Percent Paced: 7.45 %
Date Time Interrogation Session: 20211207152800
Implantable Lead Implant Date: 20170707
Implantable Lead Implant Date: 20170707
Implantable Lead Location: 753859
Implantable Lead Location: 753860
Implantable Lead Model: 3830
Implantable Lead Model: 5076
Implantable Pulse Generator Implant Date: 20170707
Lead Channel Impedance Value: 285 Ohm
Lead Channel Impedance Value: 399 Ohm
Lead Channel Impedance Value: 399 Ohm
Lead Channel Impedance Value: 437 Ohm
Lead Channel Pacing Threshold Amplitude: 1 V
Lead Channel Pacing Threshold Amplitude: 1.25 V
Lead Channel Pacing Threshold Pulse Width: 0.4 ms
Lead Channel Pacing Threshold Pulse Width: 0.4 ms
Lead Channel Sensing Intrinsic Amplitude: 4.375 mV
Lead Channel Sensing Intrinsic Amplitude: 4.375 mV
Lead Channel Sensing Intrinsic Amplitude: 6.5 mV
Lead Channel Sensing Intrinsic Amplitude: 6.625 mV
Lead Channel Setting Pacing Amplitude: 2 V
Lead Channel Setting Pacing Amplitude: 2.5 V
Lead Channel Setting Pacing Pulse Width: 1 ms
Lead Channel Setting Sensing Sensitivity: 2.8 mV

## 2020-04-12 NOTE — Telephone Encounter (Signed)
Rx for the rollator walker faxed, with the appropriate icd 10 codes. I called and advised the patient.

## 2020-04-15 ENCOUNTER — Ambulatory Visit (INDEPENDENT_AMBULATORY_CARE_PROVIDER_SITE_OTHER): Payer: Medicare HMO | Admitting: Vascular Surgery

## 2020-04-15 ENCOUNTER — Other Ambulatory Visit: Payer: Self-pay

## 2020-04-15 ENCOUNTER — Encounter (INDEPENDENT_AMBULATORY_CARE_PROVIDER_SITE_OTHER): Payer: Self-pay | Admitting: Vascular Surgery

## 2020-04-15 ENCOUNTER — Other Ambulatory Visit (INDEPENDENT_AMBULATORY_CARE_PROVIDER_SITE_OTHER): Payer: Self-pay | Admitting: Nurse Practitioner

## 2020-04-15 ENCOUNTER — Telehealth (INDEPENDENT_AMBULATORY_CARE_PROVIDER_SITE_OTHER): Payer: Self-pay

## 2020-04-15 VITALS — BP 190/87 | HR 65 | Ht 66.0 in | Wt 233.0 lb

## 2020-04-15 DIAGNOSIS — M353 Polymyalgia rheumatica: Secondary | ICD-10-CM | POA: Diagnosis not present

## 2020-04-15 DIAGNOSIS — I480 Paroxysmal atrial fibrillation: Secondary | ICD-10-CM | POA: Diagnosis not present

## 2020-04-15 DIAGNOSIS — I10 Essential (primary) hypertension: Secondary | ICD-10-CM

## 2020-04-15 DIAGNOSIS — I2510 Atherosclerotic heart disease of native coronary artery without angina pectoris: Secondary | ICD-10-CM | POA: Diagnosis not present

## 2020-04-15 DIAGNOSIS — M316 Other giant cell arteritis: Secondary | ICD-10-CM | POA: Insufficient documentation

## 2020-04-15 NOTE — H&P (View-Only) (Signed)
MRN : [id number]  Kayla [name] is a 79 y.o. (1940-07-08) female who presents with chief complaint of  Chief Complaint  Patient presents with  . Follow-up    Temporal bx 04/16/20  .  History of Present Illness:   The patient returns to the office urgently for evaluation for temporal arteritis. The patient has had a dramatic worsening of her symptoms over the past month or so. She is now having severe pain in her shoulders hips legs and feet. She states she "screams out in pain" just with standing up from a seated position. She continues to have severe headaches. She is also describing jaw claudication.  In 2018 she underwent a right temporal artery biopsy which was negative.  She was started on steroids Friday given her symptoms. Laboratory studies done prior to initiating steroids showed an ESR greater than 100 and a CRP of 4.6.   Current Meds  Medication Sig  . acetaminophen (TYLENOL) 500 MG tablet Take 1,000 mg by mouth every 6 (six) hours as needed for moderate pain (for headaches).   Marland Kitchen albuterol (PROVENTIL) (2.5 MG/3ML) 0.083% nebulizer solution Take 2.5 mg by nebulization every 2 (two) hours as needed for wheezing or shortness of breath.   Marland Kitchen albuterol (VENTOLIN HFA) 108 (90 Base) MCG/ACT inhaler Inhale 2 puffs into the lungs every 6 (six) hours as needed for wheezing or shortness of breath.  . Alcohol Swabs (B-D SINGLE USE SWABS REGULAR) PADS   . allopurinol (ZYLOPRIM) 100 MG tablet Take 100 mg by mouth 2 (two) times daily.   Marland Kitchen atorvastatin (LIPITOR) 40 MG tablet Take 1 tablet (40 mg total) by mouth daily.  . Blood Glucose Calibration (TRUE METRIX LEVEL 1) Low SOLN   . Cholecalciferol (VITAMIN D-3 PO) Take 1 capsule by mouth daily with breakfast.  . Dextromethorphan-guaiFENesin (TUSSIN DM MAX SUGAR-FREE PO) Take 5 mLs by mouth every 4 (four) hours as needed (for coughing).  Marland Kitchen ELIQUIS 5 MG TABS tablet TAKE 1 TABLET TWICE DAILY  . ferrous sulfate 325 (65 FE) MG tablet Take 1  tablet (325 mg total) by mouth daily with breakfast.  . fluticasone (FLONASE) 50 MCG/ACT nasal spray USE 2 SPRAYS NASALLY DAILY  . gabapentin (NEURONTIN) 800 MG tablet Take 800 mg by mouth See admin instructions. Take 800 mg by mouth three to four times a day  . HYDROcodone-acetaminophen (NORCO/VICODIN) 5-325 MG tablet Take 1-2 tablets by mouth every 6 (six) hours as needed for moderate pain.  Marland Kitchen insulin aspart (NOVOLOG) 100 UNIT/ML FlexPen Inject 30-70 Units into the skin See admin instructions. Inject 30-70 units into the skin three times a day with meals, per sliding scale (and an additional 1 unit for every 50 points for a BGL >130) Sliding scale  . insulin glargine (LANTUS) 100 UNIT/ML injection Inject 130 Units into the skin in the morning and at bedtime.  Marland Kitchen levothyroxine (SYNTHROID) 175 MCG tablet Take 175 mcg by mouth daily before breakfast.   . LORazepam (ATIVAN) 2 MG tablet Take 2 mg by mouth at bedtime as needed for anxiety.   . magnesium oxide (MAG-OX) 400 (241.3 Mg) MG tablet Take 400 mg by mouth 3 (three) times daily.   . nitroGLYCERIN (NITROSTAT) 0.4 MG SL tablet Place 1 tablet (0.4 mg total) under the tongue every 5 (five) minutes as needed for chest pain.  . predniSONE (DELTASONE) 20 MG tablet   . PRESCRIPTION MEDICATION CPAP: At bedtime and "as needed for shortness of breath"  . promethazine (PHENERGAN) 25  MG suppository Place 25 mg rectally daily as needed (for headaches).   . Tiotropium Bromide Monohydrate (SPIRIVA RESPIMAT) 2.5 MCG/ACT AERS Inhale 2 puffs into the lungs daily.  Marland Kitchen torsemide (DEMADEX) 20 MG tablet Take 2 tablets (40 mg total) by mouth every other day.  . TRUE METRIX BLOOD GLUCOSE TEST test strip   . TRUEplus Lancets 28G MISC   . vitamin B-12 (CYANOCOBALAMIN) 1000 MCG tablet Take 1,000 mcg by mouth daily.    Past Medical History:  Diagnosis Date  . Allergy   . Anxiety   . Arthritis   . CAD (coronary artery disease)    Mild per remote cath in 2006, /     Nuclear, January, 2013, no significant abnormality,  . Carotid artery disease ([place])    Doppler, January, 2013, 0 02% RIC A., 77-41% LICA, stable  . Cataract   . CHF (congestive heart failure) ([place])   . Complication of anesthesia    wakes up "mean"  . COPD 02/16/2007   Intolerant of Spiriva, tudorza Intolerant of Trelegy   . COPD (chronic obstructive pulmonary disease) ([place])   . Coronary atherosclerosis 11/24/2008   Catheterization 2006, nonobstructive coronary disease   //   nuclear 2013, low risk    . Diabetes mellitus, type 2 ([place])   . Diverticula of colon   . DIVERTICULOSIS OF COLON 03/23/2007   Qualifier: Diagnosis of  By: Halford Chessman MD, [place]    . Dizziness    occasional  . Dizzy spells 05/04/2011  . DM (diabetes mellitus) ([place]) 03/23/2007   Qualifier: Diagnosis of  By: Halford Chessman MD, Vineet    . Dyspnea    On exertion  . Ejection fraction   . Essential hypertension 02/16/2007   Qualifier: Diagnosis of  By: Rosana Hoes CMA, Tammy    . G E R D 03/23/2007   Qualifier: Diagnosis of  By: Halford Chessman MD, Vineet    . Gall bladder disease 04/28/2016  . Gastritis and gastroduodenitis   . GERD (gastroesophageal reflux disease)   . Goiter    hx of  . Headache(784.0)    migraine  . Hyperlipidemia   . Hypertension   . Hypothyroidism   . HYPOTHYROIDISM, POSTSURGICAL 06/04/2008   Qualifier: Diagnosis of  By: Loanne Drilling MD, Jacelyn Pi   . Left ventricular hypertrophy   . Leg edema    occasional swelling  . Leg swelling 12/09/2018  . Myofascial pain syndrome, cervical 01/06/2018  . Obesity   . OBESITY 11/24/2008   Qualifier: Diagnosis of  By: Sidney Ace    . OBSTRUCTIVE SLEEP APNEA 02/16/2007   Auto CPAP 09/03/13 to 10/02/13 >> used on 30 of 30 nights with average 6 hrs and 3 min.  Average AHI is 3.1 with median CPAP 5 cm H2O and 95 th percentile CPAP 6 cm H2O.    . Occipital neuralgia of right side 01/06/2018  . OSA (obstructive sleep apnea)    cpap setting of 12  . Pain in joint of right hip 01/21/2018  .  Palpitations 01/21/2011   48 hour Holter, 2015, scattered PACs and PVCs.   . Pneumonia   . PONV (postoperative nausea and vomiting)    severe after every surgery  . Pulmonary hypertension, secondary   . RHINITIS 07/21/2010       . RUQ abdominal pain   . S/P left TKA 07/27/2011  . Sinus node dysfunction ([place]) 11/08/2015  . Sleep apnea   . Spinal stenosis of cervical region 11/07/2014  . Urinary frequency 12/09/2018  .  UTI (urinary tract infection) 08/15/2019   per pt report  . VENTRICULAR HYPERTROPHY, LEFT 11/24/2008   Qualifier: Diagnosis of  By: Sidney Ace      Past Surgical History:  Procedure Laterality Date  . ABDOMINAL HYSTERECTOMY     with appendectomy and colon polypectomy  . AMPUTATION TOE Left 12/15/2019   Procedure: AMPUTATION  LEFT GREAT TOE;  Surgeon: Evelina Bucy, DPM;  Location: [facility];  Service: Podiatry;  Laterality: Left;  . ANTERIOR CERVICAL DECOMP/DISCECTOMY FUSION N/A 11/07/2014   Procedure: Anterior Cervical diskectomy and fusion Cervical four-five, Cervical five-six, Cervical six-seven;  Surgeon: Kary Kos, MD;  Location: [facility];  Service: Neurosurgery;  Laterality: N/A;  . APPENDECTOMY    . ARTERY BIOPSY Right 02/16/2017   Procedure: BIOPSY TEMPORAL ARTERY;  Surgeon: Katha Cabal, MD;  Location: [facility] OR[facility];  Service: Vascular;  Laterality: Right;  . BACK SURGERY    . CARDIAC CATHETERIZATION    . CATARACT EXTRACTION W/ INTRAOCULAR LENS  IMPLANT, BILATERAL Bilateral   . CHOLECYSTECTOMY N/A 04/29/2016   Procedure: LAPAROSCOPIC CHOLECYSTECTOMY WITH INTRAOPERATIVE CHOLANGIOGRAM;  Surgeon: Alphonsa Overall, MD;  Location: [facility];  Service: General;  Laterality: N/A;  . EP IMPLANTABLE DEVICE N/A 11/08/2015   MRI COMPATABLE -- Procedure: Pacemaker Implant;  Surgeon: Deboraha Sprang, MD;  Location: [facility];  Service: Cardiovascular;  Laterality: N/A;  . INSERT / REPLACE / REMOVE PACEMAKER  2017   Dr. Jens Som [facility])  . JOINT REPLACEMENT    . LUMBAR  DISC SURGERY  1990's   x 2  . THYROIDECTOMY  2011  . TOTAL KNEE ARTHROPLASTY  07/27/2011   Procedure: TOTAL KNEE ARTHROPLASTY;  Surgeon: Mauri Pole, MD;  Location: [facility];  Service: Orthopedics;  Laterality: Left;  . UPPER ESOPHAGEAL ENDOSCOPIC ULTRASOUND (EUS) N/A 05/14/2016   Procedure: UPPER ESOPHAGEAL ENDOSCOPIC ULTRASOUND (EUS);  Surgeon: Milus Banister, MD;  Location: [facility];  Service: Endoscopy;  Laterality: N/A;  radial only-will be done mod if needed    Social History Social History   Tobacco Use  . Smoking status: Former Smoker    Packs/day: 2.00    Years: 33.00    Pack years: 66.00    Types: Cigarettes    Start date: 1966    Quit date: 05/04/1997    Years since quitting: 22.9  . Smokeless tobacco: Never Used  Vaping Use  . Vaping Use: Never used  Substance Use Topics  . Alcohol use: No  . Drug use: No    Family History Family History  Problem Relation Age of Onset  . Heart Problems Mother   . Heart defect Mother        valve problems  . Heart Problems Father   . Heart disease Brother   . Lung cancer Daughter        non small cell   . Diabetes Maternal Aunt   . Diabetes Maternal Uncle   . Throat cancer Brother   . Colon cancer Neg Hx   . Colon polyps Neg Hx   . Esophageal cancer Neg Hx   . Rectal cancer Neg Hx   . Stomach cancer Neg Hx     Allergies  Allergen Reactions  . Dulaglutide Nausea Only, Other (See Comments) and Cough    Made the patient sick to her stomach (only) several days after using it Trulicity     REVIEW OF SYSTEMS (Negative unless checked)  Constitutional: _0 Weight loss  _1 Fever  _2 Chills Cardiac: _3 Chest pain   _4   Chest pressure   _0 Palpitations   _1 Shortness of breath when laying flat   _2 Shortness of breath with exertion. Vascular:  _3 Pain in legs with walking   _4 Pain in legs at rest  _5 History of DVT   _6 Phlebitis   _7 Swelling in legs   _8 Varicose veins   _9 Non-healing ulcers Pulmonary:   _10 Uses home oxygen    _11 Productive cough   _12 Hemoptysis   _13 Wheeze  _14 COPD   _15 Asthma Neurologic:  _16 Dizziness   _17 Seizures   _18 History of stroke   _19 History of TIA  _20 Aphasia   _21 Vissual changes   _22 Weakness or numbness in arm   _23 Weakness or numbness in leg Musculoskeletal:   _24 Joint swelling   _25 Joint pain   _26 Low back pain Hematologic:  _27 Easy bruising  _28 Easy bleeding   _29 Hypercoagulable state   _30 Anemic Gastrointestinal:  _31 Diarrhea   _32 Vomiting  _33 Gastroesophageal reflux/heartburn   _34 Difficulty swallowing. Genitourinary:  _35 Chronic kidney disease   _36 Difficult urination  _37 Frequent urination   _38 Blood in urine Skin:  _39 Rashes   _40 Ulcers  Psychological:  _41 History of anxiety   _42  History of major depression.  Physical Examination  Vitals:   04/15/20 1157  BP: (!) 190/87  Pulse: 65  Weight: 233 lb (105.7 kg)  Height: _43  (1.676 m)   Body mass index is 37.61 kg/m. Gen: WD/WN, NAD Head: [place]/AT, No temporalis wasting.  Ear/Nose/Throat: Hearing grossly intact, nares w/o erythema or drainage Eyes: PER, EOMI, sclera nonicteric.  Neck: Supple, no large masses.   Pulmonary:  Good air movement, no audible wheezing bilaterally, no use of accessory muscles.  Cardiac: RRR, no JVD Vascular:  Vessel Right Left  Radial Palpable Palpable  Brachial Palpable Palpable  Carotid Palpable Palpable  Gastrointestinal: Non-distended. No guarding/no peritoneal signs.  Musculoskeletal: M/S 5/5 throughout.  No deformity or atrophy.  Neurologic: CN 2-12 intact. Symmetrical.  Speech is fluent. Motor exam as listed above. Psychiatric: Judgment intact, Mood & affect appropriate for pt's clinical situation. Dermatologic: No rashes or ulcers noted.  No changes consistent with cellulitis.  CBC Lab Results  Component Value Date   WBC 10.6 04/09/2020   HGB 12.5 04/09/2020   HCT 38.8 04/09/2020   MCV 86 04/09/2020   PLT 429 04/09/2020    BMET    Component Value Date/Time   NA 142 02/16/2020 1124   K 4.8 02/16/2020  1124   CL 103 02/16/2020 1124   CO2 24 02/16/2020 1124   GLUCOSE 207 (H) 02/16/2020 1124   GLUCOSE 250 (H) 12/12/2019 1113   BUN 31 (H) 02/16/2020 1124   CREATININE 1.32 (H) 02/16/2020 1124   CREATININE 1.32 (H) 11/06/2015 1006   CALCIUM 9.8 02/16/2020 1124   CALCIUM 9.9 06/04/2008 2240   GFRNONAA 39 (L) 02/16/2020 1124   GFRAA 45 (L) 02/16/2020 1124   CrCl cannot be calculated (Patient's most recent lab result is older than the maximum 21 days allowed.).  COAG Lab Results  Component Value Date   INR 1.1 10/02/2019   INR 0.91 02/15/2017   INR 1.0 10/29/2015    Radiology CUP PACEART INCLINIC DEVICE CHECK  Result Date: 04/12/2020 Pacemaker check in clinic. Normal device function. Thresholds, sensing, impedances consistent with previous measurements. Device programmed to maximize longevity. 2 AF episodes since last transmission, longest episode was 6 hours with a Peak A/V rate 600/162, average A/V rate 392/80.  AF Burden 16.9%, +OAC.  No high ventricular rates noted. Device programmed at appropriate safety margins. Histogram distribution appropriate for patient activity level. Estimated longevity 5 years. Patient  enrolled in remote follow-up, next scheduled check 06/20/20. ROV in 3 months with Dr. Caryl Comes.  Patient education completed.Trena Platt, BSN, RN  CUP PACEART REMOTE DEVICE CHECK  Result Date: 03/23/2020 Scheduled remote reviewed. Normal device function.  52 ATR events, atrial burden 18.8%.  Known PAF, Mar-Mac noted.  Histogram appears controlled.  Will continue to monitor.  R. Powers, LPN, CVRS  Next remote 91 days.  XR Knee 1-2 Views Right  Result Date: 03/21/2020 X-rays of the right knee reveal unchanged alignment, I believe she has callus formation at the lateral tibial plateau in the region of her nondisplaced fracture.  There is no sign of collapse.  Moderate degenerative changes noted.   Assessment/Plan 1. Polymyalgia rheumatica ([place]) Patient has been initiated on  prednisone. Given her dramatic worsening of her symptoms in association with her laboratory testing left temporal artery biopsy is warranted.  The risks and benefits for left temporal artery biopsy were reviewed all questions been answered patient agrees to proceed. I will attempt to get the schedule for tomorrow.   A total of 35 minutes was spent with this patient and greater than 50% was spent in counseling and coordination of care with the patient.  Discussion included the treatment options for vascular disease including indications for surgery and intervention.  Also discussed is the appropriate timing of treatment.  In addition medical therapy was discussed.  2. Temporal arteritis ([place]) See #1  3. Essential hypertension Continue antihypertensive medications as already ordered, these medications have been reviewed and there are no changes at this time.   4. Atherosclerosis of native coronary artery of native heart without angina pectoris Continue cardiac and antihypertensive medications as already ordered and reviewed, no changes at this time.  Continue statin as ordered and reviewed, no changes at this time  Nitrates PRN for chest pain   5. Paroxysmal A-fib (H[facility]) Continue antiarrhythmia medications as already ordered, these medications have been reviewed and there are no changes at this time.  Continue anticoagulation as ordered by Cardiology Service   [name], MD  04/15/2020 12:50 PM

## 2020-04-15 NOTE — Telephone Encounter (Signed)
Spoke with the patient she is now scheduled with Dr. Delana Meyer for a Temporal Artery Biopsy on 04/16/20 at [facility]. Covid testing is on 04/16/20 at 8:00 am at the [facility] and patient was told to arrive 2 hours early for pre-op before her surgery on 04/16/20. Patient was instructed  to not eat or drink anything after 12 midnight tonight and to not take her insulin as well. Patient was told to take her blood pressure medications with her and it will be discussed then.

## 2020-04-15 NOTE — Progress Notes (Signed)
  MRN : [id number]  Kayla [name] is a 79 y.o. (11/10/1940) female who presents with chief complaint of  Chief Complaint  Patient presents with  . Follow-up    Temporal bx 04/16/20  .  History of Present Illness:   The patient returns to the office urgently for evaluation for temporal arteritis. The patient has had a dramatic worsening of her symptoms over the past month or so. She is now having severe pain in her shoulders hips legs and feet. She states she "screams out in pain" just with standing up from a seated position. She continues to have severe headaches. She is also describing jaw claudication.  In 2018 she underwent a right temporal artery biopsy which was negative.  She was started on steroids Friday given her symptoms. Laboratory studies done prior to initiating steroids showed an ESR greater than 100 and a CRP of 4.6.   Current Meds  Medication Sig  . acetaminophen (TYLENOL) 500 MG tablet Take 1,000 mg by mouth every 6 (six) hours as needed for moderate pain (for headaches).   . albuterol (PROVENTIL) (2.5 MG/3ML) 0.083% nebulizer solution Take 2.5 mg by nebulization every 2 (two) hours as needed for wheezing or shortness of breath.   . albuterol (VENTOLIN HFA) 108 (90 Base) MCG/ACT inhaler Inhale 2 puffs into the lungs every 6 (six) hours as needed for wheezing or shortness of breath.  . Alcohol Swabs (B-D SINGLE USE SWABS REGULAR) PADS   . allopurinol (ZYLOPRIM) 100 MG tablet Take 100 mg by mouth 2 (two) times daily.   . atorvastatin (LIPITOR) 40 MG tablet Take 1 tablet (40 mg total) by mouth daily.  . Blood Glucose Calibration (TRUE METRIX LEVEL 1) Low SOLN   . Cholecalciferol (VITAMIN D-3 PO) Take 1 capsule by mouth daily with breakfast.  . Dextromethorphan-guaiFENesin (TUSSIN DM MAX SUGAR-FREE PO) Take 5 mLs by mouth every 4 (four) hours as needed (for coughing).  . ELIQUIS 5 MG TABS tablet TAKE 1 TABLET TWICE DAILY  . ferrous sulfate 325 (65 FE) MG tablet Take 1  tablet (325 mg total) by mouth daily with breakfast.  . fluticasone (FLONASE) 50 MCG/ACT nasal spray USE 2 SPRAYS NASALLY DAILY  . gabapentin (NEURONTIN) 800 MG tablet Take 800 mg by mouth See admin instructions. Take 800 mg by mouth three to four times a day  . HYDROcodone-acetaminophen (NORCO/VICODIN) 5-325 MG tablet Take 1-2 tablets by mouth every 6 (six) hours as needed for moderate pain.  . insulin aspart (NOVOLOG) 100 UNIT/ML FlexPen Inject 30-70 Units into the skin See admin instructions. Inject 30-70 units into the skin three times a day with meals, per sliding scale (and an additional 1 unit for every 50 points for a BGL >130) Sliding scale  . insulin glargine (LANTUS) 100 UNIT/ML injection Inject 130 Units into the skin in the morning and at bedtime.  . levothyroxine (SYNTHROID) 175 MCG tablet Take 175 mcg by mouth daily before breakfast.   . LORazepam (ATIVAN) 2 MG tablet Take 2 mg by mouth at bedtime as needed for anxiety.   . magnesium oxide (MAG-OX) 400 (241.3 Mg) MG tablet Take 400 mg by mouth 3 (three) times daily.   . nitroGLYCERIN (NITROSTAT) 0.4 MG SL tablet Place 1 tablet (0.4 mg total) under the tongue every 5 (five) minutes as needed for chest pain.  . predniSONE (DELTASONE) 20 MG tablet   . PRESCRIPTION MEDICATION CPAP: At bedtime and "as needed for shortness of breath"  . promethazine (PHENERGAN) 25   MG suppository Place 25 mg rectally daily as needed (for headaches).   . Tiotropium Bromide Monohydrate (SPIRIVA RESPIMAT) 2.5 MCG/ACT AERS Inhale 2 puffs into the lungs daily.  Marland Kitchen torsemide (DEMADEX) 20 MG tablet Take 2 tablets (40 mg total) by mouth every other day.  . TRUE METRIX BLOOD GLUCOSE TEST test strip   . TRUEplus Lancets 28G MISC   . vitamin B-12 (CYANOCOBALAMIN) 1000 MCG tablet Take 1,000 mcg by mouth daily.    Past Medical History:  Diagnosis Date  . Allergy   . Anxiety   . Arthritis   . CAD (coronary artery disease)    Mild per remote cath in 2006, /     Nuclear, January, 2013, no significant abnormality,  . Carotid artery disease ([place])    Doppler, January, 2013, 0 02% RIC A., 77-41% LICA, stable  . Cataract   . CHF (congestive heart failure) ([place])   . Complication of anesthesia    wakes up "mean"  . COPD 02/16/2007   Intolerant of Spiriva, tudorza Intolerant of Trelegy   . COPD (chronic obstructive pulmonary disease) ([place])   . Coronary atherosclerosis 11/24/2008   Catheterization 2006, nonobstructive coronary disease   //   nuclear 2013, low risk    . Diabetes mellitus, type 2 ([place])   . Diverticula of colon   . DIVERTICULOSIS OF COLON 03/23/2007   Qualifier: Diagnosis of  By: Halford Chessman MD, Vine[place]    . Dizziness    occasional  . Dizzy spells 05/04/2011  . DM (diabetes mellitus) ([place]) 03/23/2007   Qualifier: Diagnosis of  By: Halford Chessman MD, Vineet    . Dyspnea    On exertion  . Ejection fraction   . Essential hypertension 02/16/2007   Qualifier: Diagnosis of  By: Rosana Hoes CMA, Tammy    . G E R D 03/23/2007   Qualifier: Diagnosis of  By: Halford Chessman MD, Vineet    . Gall bladder disease 04/28/2016  . Gastritis and gastroduodenitis   . GERD (gastroesophageal reflux disease)   . Goiter    hx of  . Headache(784.0)    migraine  . Hyperlipidemia   . Hypertension   . Hypothyroidism   . HYPOTHYROIDISM, POSTSURGICAL 06/04/2008   Qualifier: Diagnosis of  By: Loanne Drilling MD, Jacelyn Pi   . Left ventricular hypertrophy   . Leg edema    occasional swelling  . Leg swelling 12/09/2018  . Myofascial pain syndrome, cervical 01/06/2018  . Obesity   . OBESITY 11/24/2008   Qualifier: Diagnosis of  By: Sidney Ace    . OBSTRUCTIVE SLEEP APNEA 02/16/2007   Auto CPAP 09/03/13 to 10/02/13 >> used on 30 of 30 nights with average 6 hrs and 3 min.  Average AHI is 3.1 with median CPAP 5 cm H2O and 95 th percentile CPAP 6 cm H2O.    . Occipital neuralgia of right side 01/06/2018  . OSA (obstructive sleep apnea)    cpap setting of 12  . Pain in joint of right hip 01/21/2018  .  Palpitations 01/21/2011   48 hour Holter, 2015, scattered PACs and PVCs.   . Pneumonia   . PONV (postoperative nausea and vomiting)    severe after every surgery  . Pulmonary hypertension, secondary   . RHINITIS 07/21/2010       . RUQ abdominal pain   . S/P left TKA 07/27/2011  . Sinus node dysfunction ([place]) 11/08/2015  . Sleep apnea   . Spinal stenosis of cervical region 11/07/2014  . Urinary frequency 12/09/2018  .  UTI (urinary tract infection) 08/15/2019   per pt report  . VENTRICULAR HYPERTROPHY, LEFT 11/24/2008   Qualifier: Diagnosis of  By: Sidney Ace      Past Surgical History:  Procedure Laterality Date  . ABDOMINAL HYSTERECTOMY     with appendectomy and colon polypectomy  . AMPUTATION TOE Left 12/15/2019   Procedure: AMPUTATION  LEFT GREAT TOE;  Surgeon: Evelina Bucy, DPM;  Location: [facility];  Service: Podiatry;  Laterality: Left;  . ANTERIOR CERVICAL DECOMP/DISCECTOMY FUSION N/A 11/07/2014   Procedure: Anterior Cervical diskectomy and fusion Cervical four-five, Cervical five-six, Cervical six-seven;  Surgeon: Kary Kos, MD;  Location: [facility];  Service: Neurosurgery;  Laterality: N/A;  . APPENDECTOMY    . ARTERY BIOPSY Right 02/16/2017   Procedure: BIOPSY TEMPORAL ARTERY;  Surgeon: Katha Cabal, MD;  Location: [facility] ORS;  Service: Vascular;  Laterality: Right;  . BACK SURGERY    . CARDIAC CATHETERIZATION    . CATARACT EXTRACTION W/ INTRAOCULAR LENS  IMPLANT, BILATERAL Bilateral   . CHOLECYSTECTOMY N/A 04/29/2016   Procedure: LAPAROSCOPIC CHOLECYSTECTOMY WITH INTRAOPERATIVE CHOLANGIOGRAM;  Surgeon: Alphonsa Overall, MD;  Location: [facility];  Service: General;  Laterality: N/A;  . EP IMPLANTABLE DEVICE N/A 11/08/2015   MRI COMPATABLE -- Procedure: Pacemaker Implant;  Surgeon: Deboraha Sprang, MD;  Location: [facility];  Service: Cardiovascular;  Laterality: N/A;  . INSERT / REPLACE / REMOVE PACEMAKER  2017   Dr. Jens Som [facility])  . JOINT REPLACEMENT    . LUMBAR  DISC SURGERY  1990's   x 2  . THYROIDECTOMY  2011  . TOTAL KNEE ARTHROPLASTY  07/27/2011   Procedure: TOTAL KNEE ARTHROPLASTY;  Surgeon: Mauri Pole, MD;  Location: [facility];  Service: Orthopedics;  Laterality: Left;  . UPPER ESOPHAGEAL ENDOSCOPIC ULTRASOUND (EUS) N/A 05/14/2016   Procedure: UPPER ESOPHAGEAL ENDOSCOPIC ULTRASOUND (EUS);  Surgeon: Milus Banister, MD;  Location: [facility];  Service: Endoscopy;  Laterality: N/A;  radial only-will be done mod if needed    Social History Social History   Tobacco Use  . Smoking status: Former Smoker    Packs/day: 2.00    Years: 33.00    Pack years: 66.00    Types: Cigarettes    Start date: 1966    Quit date: 05/04/1997    Years since quitting: 22.9  . Smokeless tobacco: Never Used  Vaping Use  . Vaping Use: Never used  Substance Use Topics  . Alcohol use: No  . Drug use: No    Family History Family History  Problem Relation Age of Onset  . Heart Problems Mother   . Heart defect Mother        valve problems  . Heart Problems Father   . Heart disease Brother   . Lung cancer Daughter        non small cell   . Diabetes Maternal Aunt   . Diabetes Maternal Uncle   . Throat cancer Brother   . Colon cancer Neg Hx   . Colon polyps Neg Hx   . Esophageal cancer Neg Hx   . Rectal cancer Neg Hx   . Stomach cancer Neg Hx     Allergies  Allergen Reactions  . Dulaglutide Nausea Only, Other (See Comments) and Cough    Made the patient sick to her stomach (only) several days after using it Trulicity     REVIEW OF SYSTEMS (Negative unless checked)  Constitutional: _0 Weight loss  _1 Fever  _2 Chills Cardiac: _3 Chest pain   _4   Chest pressure   _0 Palpitations   _1 Shortness of breath when laying flat   _2 Shortness of breath with exertion. Vascular:  _3 Pain in legs with walking   _4 Pain in legs at rest  _5 History of DVT   _6 Phlebitis   _7 Swelling in legs   _8 Varicose veins   _9 Non-healing ulcers Pulmonary:   _10 Uses home oxygen    _11 Productive cough   _12 Hemoptysis   _13 Wheeze  _14 COPD   _15 Asthma Neurologic:  _16 Dizziness   _17 Seizures   _18 History of stroke   _19 History of TIA  _20 Aphasia   _21 Vissual changes   _22 Weakness or numbness in arm   _23 Weakness or numbness in leg Musculoskeletal:   _24 Joint swelling   _25 Joint pain   _26 Low back pain Hematologic:  _27 Easy bruising  _28 Easy bleeding   _29 Hypercoagulable state   _30 Anemic Gastrointestinal:  _31 Diarrhea   _32 Vomiting  _33 Gastroesophageal reflux/heartburn   _34 Difficulty swallowing. Genitourinary:  _35 Chronic kidney disease   _36 Difficult urination  _37 Frequent urination   _38 Blood in urine Skin:  _39 Rashes   _40 Ulcers  Psychological:  _41 History of anxiety   _42  History of major depression.  Physical Examination  Vitals:   04/15/20 1157  BP: (!) 190/87  Pulse: 65  Weight: 233 lb (105.7 kg)  Height: _43  (1.676 m)   Body mass index is 37.61 kg/m. Gen: WD/WN, NAD Head: [place]/AT, No temporalis wasting.  Ear/Nose/Throat: Hearing grossly intact, nares w/o erythema or drainage Eyes: PER, EOMI, sclera nonicteric.  Neck: Supple, no large masses.   Pulmonary:  Good air movement, no audible wheezing bilaterally, no use of accessory muscles.  Cardiac: RRR, no JVD Vascular:  [name] Right Left  Radial Palpable Palpable  Brachial Palpable Palpable  Carotid Palpable Palpable  Gastrointestinal: Non-distended. No guarding/no peritoneal signs.  Musculoskeletal: M/S 5/5 throughout.  No deformity or atrophy.  Neurologic: CN 2-12 intact. Symmetrical.  Speech is fluent. Motor exam as listed above. Psychiatric: Judgment intact, Mood & affect appropriate for pt's clinical situation. Dermatologic: No rashes or ulcers noted.  No changes consistent with cellulitis.  CBC Lab Results  Component Value Date   WBC 10.6 04/09/2020   HGB 12.5 04/09/2020   HCT 38.8 04/09/2020   MCV 86 04/09/2020   PLT 429 04/09/2020    BMET    Component Value Date/Time   NA 142 02/16/2020 1124   K 4.8 02/16/2020  1124   CL 103 02/16/2020 1124   CO2 24 02/16/2020 1124   GLUCOSE 207 (H) 02/16/2020 1124   GLUCOSE 250 (H) 12/12/2019 1113   BUN 31 (H) 02/16/2020 1124   CREATININE 1.32 (H) 02/16/2020 1124   CREATININE 1.32 (H) 11/06/2015 1006   CALCIUM 9.8 02/16/2020 1124   CALCIUM 9.9 06/04/2008 2240   GFRNONAA 39 (L) 02/16/2020 1124   GFRAA 45 (L) 02/16/2020 1124   CrCl cannot be calculated (Patient's most recent lab result is older than the maximum 21 days allowed.).  COAG Lab Results  Component Value Date   INR 1.1 10/02/2019   INR 0.91 02/15/2017   INR 1.0 10/29/2015    Radiology CUP PACEART INCLINIC DEVICE CHECK  Result Date: 04/12/2020 Pacemaker check in clinic. Normal device function. Thresholds, sensing, impedances consistent with previous measurements. Device programmed to maximize longevity. 2 AF episodes since last transmission, longest episode was 6 hours with a Peak A/V rate 600/162, average A/V rate 392/80.  AF Burden 16.9%, +OAC.  No high ventricular rates noted. Device programmed at appropriate safety margins. Histogram distribution appropriate for patient activity level. Estimated longevity 5 years. Patient  enrolled in remote follow-up, next scheduled check 06/20/20. ROV in 3 months with Dr. Caryl Comes.  Patient education completed.Trena Platt, BSN, RN  CUP PACEART REMOTE DEVICE CHECK  Result Date: 03/23/2020 Scheduled remote reviewed. Normal device function.  52 ATR events, atrial burden 18.8%.  Known PAF, Mar-Mac noted.  Histogram appears controlled.  Will continue to monitor.  R. Powers, LPN, CVRS  Next remote 91 days.  XR Knee 1-2 Views Right  Result Date: 03/21/2020 X-rays of the right knee reveal unchanged alignment, I believe she has callus formation at the lateral tibial plateau in the region of her nondisplaced fracture.  There is no sign of collapse.  Moderate degenerative changes noted.   Assessment/Plan 1. Polymyalgia rheumatica ([place]) Patient has been initiated on  prednisone. Given her dramatic worsening of her symptoms in association with her laboratory testing left temporal artery biopsy is warranted.  The risks and benefits for left temporal artery biopsy were reviewed all questions been answered patient agrees to proceed. I will attempt to get the schedule for tomorrow.   A total of 35 minutes was spent with this patient and greater than 50% was spent in counseling and coordination of care with the patient.  Discussion included the treatment options for vascular disease including indications for surgery and intervention.  Also discussed is the appropriate timing of treatment.  In addition medical therapy was discussed.  2. Temporal arteritis ([place]) See #1  3. Essential hypertension Continue antihypertensive medications as already ordered, these medications have been reviewed and there are no changes at this time.   4. Atherosclerosis of native coronary artery of native heart without angina pectoris Continue cardiac and antihypertensive medications as already ordered and reviewed, no changes at this time.  Continue statin as ordered and reviewed, no changes at this time  Nitrates PRN for chest pain   5. Paroxysmal A-fib ([facility]) Continue antiarrhythmia medications as already ordered, these medications have been reviewed and there are no changes at this time.  Continue anticoagulation as ordered by Cardiology Service   [name], MD  04/15/2020 12:50 PM

## 2020-04-16 ENCOUNTER — Telehealth: Payer: Self-pay | Admitting: *Deleted

## 2020-04-16 ENCOUNTER — Encounter: Payer: Self-pay | Admitting: Internal Medicine

## 2020-04-16 ENCOUNTER — Other Ambulatory Visit
Admission: RE | Admit: 2020-04-16 | Discharge: 2020-04-16 | Disposition: A | Discharge status: Home / Self Care | Payer: Medicare HMO | Source: Ambulatory Visit | Attending: Vascular Surgery | Admitting: Vascular Surgery

## 2020-04-16 ENCOUNTER — Ambulatory Visit
Admission: RE | Admit: 2020-04-16 | Discharge: 2020-04-16 | Disposition: A | Discharge status: Home / Self Care | Payer: Medicare HMO | Attending: Vascular Surgery | Admitting: Vascular Surgery

## 2020-04-16 ENCOUNTER — Ambulatory Visit: Payer: Medicare HMO | Admitting: Urgent Care

## 2020-04-16 ENCOUNTER — Other Ambulatory Visit: Payer: Self-pay

## 2020-04-16 ENCOUNTER — Encounter
Admission: RE | Disposition: A | Discharge status: Home / Self Care | Payer: Self-pay | Source: Home / Self Care | Attending: Vascular Surgery

## 2020-04-16 ENCOUNTER — Encounter: Payer: Self-pay | Admitting: Vascular Surgery

## 2020-04-16 DIAGNOSIS — M316 Other giant cell arteritis: Secondary | ICD-10-CM | POA: Diagnosis not present

## 2020-04-16 DIAGNOSIS — R519 Headache, unspecified: Secondary | ICD-10-CM | POA: Insufficient documentation

## 2020-04-16 DIAGNOSIS — Z79899 Other long term (current) drug therapy: Secondary | ICD-10-CM | POA: Diagnosis not present

## 2020-04-16 DIAGNOSIS — M25571 Pain in right ankle and joints of right foot: Secondary | ICD-10-CM | POA: Diagnosis not present

## 2020-04-16 DIAGNOSIS — Z20822 Contact with and (suspected) exposure to covid-19: Secondary | ICD-10-CM | POA: Insufficient documentation

## 2020-04-16 DIAGNOSIS — E785 Hyperlipidemia, unspecified: Secondary | ICD-10-CM | POA: Diagnosis not present

## 2020-04-16 DIAGNOSIS — M353 Polymyalgia rheumatica: Secondary | ICD-10-CM | POA: Insufficient documentation

## 2020-04-16 DIAGNOSIS — M25551 Pain in right hip: Secondary | ICD-10-CM | POA: Diagnosis not present

## 2020-04-16 DIAGNOSIS — Z7984 Long term (current) use of oral hypoglycemic drugs: Secondary | ICD-10-CM | POA: Diagnosis not present

## 2020-04-16 DIAGNOSIS — Z87891 Personal history of nicotine dependence: Secondary | ICD-10-CM | POA: Diagnosis not present

## 2020-04-16 DIAGNOSIS — G4733 Obstructive sleep apnea (adult) (pediatric): Secondary | ICD-10-CM | POA: Diagnosis not present

## 2020-04-16 DIAGNOSIS — E039 Hypothyroidism, unspecified: Secondary | ICD-10-CM | POA: Diagnosis not present

## 2020-04-16 DIAGNOSIS — I41 Myocarditis in diseases classified elsewhere: Secondary | ICD-10-CM | POA: Diagnosis not present

## 2020-04-16 DIAGNOSIS — E119 Type 2 diabetes mellitus without complications: Secondary | ICD-10-CM | POA: Diagnosis not present

## 2020-04-16 DIAGNOSIS — R51 Headache with orthostatic component, not elsewhere classified: Secondary | ICD-10-CM | POA: Diagnosis not present

## 2020-04-16 DIAGNOSIS — I10 Essential (primary) hypertension: Secondary | ICD-10-CM | POA: Diagnosis not present

## 2020-04-16 HISTORY — DX: Unspecified atrial fibrillation: I48.91

## 2020-04-16 HISTORY — PX: ARTERY BIOPSY: SHX891

## 2020-04-16 LAB — CBC WITH DIFFERENTIAL/PLATELET
Abs Immature Granulocytes: 0.05 K/uL (ref 0.00–0.07)
Basophils Absolute: 0.1 K/uL (ref 0.0–0.1)
Basophils Relative: 1 %
Eosinophils Absolute: 0.2 K/uL (ref 0.0–0.5)
Eosinophils Relative: 1 %
HCT: 38.4 % (ref 36.0–46.0)
Hemoglobin: 12 g/dL (ref 12.0–15.0)
Immature Granulocytes: 0 %
Lymphocytes Relative: 26 %
Lymphs Abs: 3.4 K/uL (ref 0.7–4.0)
MCH: 27.6 pg (ref 26.0–34.0)
MCHC: 31.3 g/dL (ref 30.0–36.0)
MCV: 88.5 fL (ref 80.0–100.0)
Monocytes Absolute: 1.1 K/uL — ABNORMAL HIGH (ref 0.1–1.0)
Monocytes Relative: 8 %
Neutro Abs: 8.3 K/uL — ABNORMAL HIGH (ref 1.7–7.7)
Neutrophils Relative %: 64 %
Platelets: 384 K/uL (ref 150–400)
RBC: 4.34 MIL/uL (ref 3.87–5.11)
RDW: 17.5 % — ABNORMAL HIGH (ref 11.5–15.5)
WBC: 13.1 K/uL — ABNORMAL HIGH (ref 4.0–10.5)
nRBC: 0 % (ref 0.0–0.2)

## 2020-04-16 LAB — BASIC METABOLIC PANEL
Anion gap: 10 (ref 5–15)
BUN: 26 mg/dL — ABNORMAL HIGH (ref 8–23)
CO2: 25 mmol/L (ref 22–32)
Calcium: 9.3 mg/dL (ref 8.9–10.3)
Chloride: 105 mmol/L (ref 98–111)
Creatinine, Ser: 1.05 mg/dL — ABNORMAL HIGH (ref 0.44–1.00)
GFR, Estimated: 54 mL/min — ABNORMAL LOW (ref >60)
Glucose, Bld: 132 mg/dL — ABNORMAL HIGH (ref 70–99)
Potassium: 3.8 mmol/L (ref 3.5–5.1)
Sodium: 140 mmol/L (ref 135–145)

## 2020-04-16 LAB — GLUCOSE, CAPILLARY
Glucose-Capillary: 130 mg/dL — ABNORMAL HIGH (ref 70–99)
Glucose-Capillary: 111 mg/dL — ABNORMAL HIGH (ref 70–99)

## 2020-04-16 LAB — SARS CORONAVIRUS 2 BY RT PCR (HOSPITAL ORDER, PERFORMED IN ~~LOC~~ HOSPITAL LAB): SARS Coronavirus 2: NEGATIVE

## 2020-04-16 SURGERY — BIOPSY TEMPORAL ARTERY
Anesthesia: General | Site: Head | Laterality: Left

## 2020-04-16 MED ORDER — ONDANSETRON HCL 4 MG/2ML IJ SOLN: 4.0000 mg | Freq: Once | INTRAMUSCULAR | Status: DC | PRN

## 2020-04-16 MED ORDER — DEXAMETHASONE SODIUM PHOSPHATE 4 MG/ML IJ SOLN
INTRAMUSCULAR | Status: DC | PRN
  Administered 2020-04-16: 10 mg via INTRAVENOUS

## 2020-04-16 MED ORDER — FENTANYL CITRATE (PF) 100 MCG/2ML IJ SOLN
25.0000 ug | INTRAMUSCULAR | Status: DC | PRN
Start: 2020-04-16 — End: 2020-04-17

## 2020-04-16 MED ORDER — PROPOFOL 10 MG/ML IV BOLUS
INTRAVENOUS | Status: DC | PRN
  Administered 2020-04-16: 30 mg via INTRAVENOUS
  Administered 2020-04-16: 40 mg via INTRAVENOUS
  Administered 2020-04-16: 30 mg via INTRAVENOUS

## 2020-04-16 MED ORDER — CHLORHEXIDINE GLUCONATE CLOTH 2 % EX PADS
6.0000 | MEDICATED_PAD | Freq: Once | CUTANEOUS | Status: AC
  Administered 2020-04-16: 6 via TOPICAL

## 2020-04-16 MED ORDER — CEFAZOLIN SODIUM-DEXTROSE 2-4 GM/100ML-% IV SOLN
2.0000 g | INTRAVENOUS | Status: AC
  Administered 2020-04-16: 2 g via INTRAVENOUS

## 2020-04-16 MED ORDER — DEXAMETHASONE SODIUM PHOSPHATE 10 MG/ML IJ SOLN
INTRAMUSCULAR | Status: AC
  Filled 2020-04-16: qty 1

## 2020-04-16 MED ORDER — PROPOFOL 10 MG/ML IV BOLUS
INTRAVENOUS | Status: AC
  Filled 2020-04-16: qty 20

## 2020-04-16 MED ORDER — ORAL CARE MOUTH RINSE: 15.0000 mL | Freq: Once | OROMUCOSAL | Status: AC

## 2020-04-16 MED ORDER — HYDROCODONE-ACETAMINOPHEN 5-325 MG PO TABS: 1.0000 | ORAL_TABLET | Freq: Four times a day (QID) | ORAL | 0 refills | Status: DC | PRN

## 2020-04-16 MED ORDER — BUPIVACAINE HCL (PF) 0.5 % IJ SOLN
INTRAMUSCULAR | Status: AC
  Filled 2020-04-16: qty 30

## 2020-04-16 MED ORDER — PROPOFOL 500 MG/50ML IV EMUL
INTRAVENOUS | Status: AC
  Filled 2020-04-16: qty 50

## 2020-04-16 MED ORDER — SODIUM CHLORIDE 0.9 % IV SOLN: INTRAVENOUS | Status: DC

## 2020-04-16 MED ORDER — CHLORHEXIDINE GLUCONATE CLOTH 2 % EX PADS: 6.0000 | MEDICATED_PAD | Freq: Once | CUTANEOUS | Status: DC

## 2020-04-16 MED ORDER — LIDOCAINE-EPINEPHRINE 1 %-1:100000 IJ SOLN
INTRAMUSCULAR | Status: AC
  Filled 2020-04-16: qty 1

## 2020-04-16 MED ORDER — CHLORHEXIDINE GLUCONATE 0.12 % MT SOLN
15.0000 mL | Freq: Once | OROMUCOSAL | Status: AC
  Administered 2020-04-16: 15 mL via OROMUCOSAL

## 2020-04-16 MED ORDER — LIDOCAINE HCL (CARDIAC) PF 100 MG/5ML IV SOSY
PREFILLED_SYRINGE | INTRAVENOUS | Status: DC | PRN
  Administered 2020-04-16: 80 mg via INTRAVENOUS

## 2020-04-16 MED ORDER — FENTANYL CITRATE (PF) 100 MCG/2ML IJ SOLN
INTRAMUSCULAR | Status: AC
  Filled 2020-04-16: qty 2

## 2020-04-16 MED ORDER — ONDANSETRON HCL 4 MG/2ML IJ SOLN
INTRAMUSCULAR | Status: AC
  Filled 2020-04-16: qty 2

## 2020-04-16 MED ORDER — PROPOFOL 500 MG/50ML IV EMUL
INTRAVENOUS | Status: DC | PRN
  Administered 2020-04-16: 50 ug/kg/min via INTRAVENOUS

## 2020-04-16 MED ORDER — LIDOCAINE HCL (PF) 2 % IJ SOLN
INTRAMUSCULAR | Status: AC
  Filled 2020-04-16: qty 5

## 2020-04-16 MED ORDER — LIDOCAINE-EPINEPHRINE 1 %-1:100000 IJ SOLN
INTRAMUSCULAR | Status: DC | PRN
  Administered 2020-04-16: 2 mL

## 2020-04-16 MED ORDER — CHLORHEXIDINE GLUCONATE 0.12 % MT SOLN
OROMUCOSAL | Status: AC
  Filled 2020-04-16: qty 15

## 2020-04-16 MED ORDER — CEFAZOLIN SODIUM-DEXTROSE 2-4 GM/100ML-% IV SOLN
INTRAVENOUS | Status: AC
  Filled 2020-04-16: qty 100

## 2020-04-16 MED ORDER — FENTANYL CITRATE (PF) 100 MCG/2ML IJ SOLN
INTRAMUSCULAR | Status: DC | PRN
  Administered 2020-04-16 (×4): 25 ug via INTRAVENOUS

## 2020-04-16 MED ORDER — BUPIVACAINE HCL (PF) 0.5 % IJ SOLN
INTRAMUSCULAR | Status: DC | PRN
  Administered 2020-04-16: 10 mL

## 2020-04-16 SURGICAL SUPPLY — 38 items
BLADE CLIPPER SURG (BLADE) ×2 IMPLANT
BLADE SURG 15 STRL LF DISP TIS (BLADE) ×1 IMPLANT
BLADE SURG 15 STRL SS (BLADE) ×2
COTTON BALL STRL MEDIUM (GAUZE/BANDAGES/DRESSINGS) ×2 IMPLANT
COVER WAND RF STERILE (DRAPES) ×2 IMPLANT
DERMABOND ADVANCED (GAUZE/BANDAGES/DRESSINGS) ×1
DERMABOND ADVANCED .7 DNX12 (GAUZE/BANDAGES/DRESSINGS) ×1 IMPLANT
DRAPE LAPAROTOMY 77X122 PED (DRAPES) ×2 IMPLANT
DRSG TELFA 4X3 1S NADH ST (GAUZE/BANDAGES/DRESSINGS) ×2 IMPLANT
ELECT CAUTERY BLADE 6.4 (BLADE) ×2 IMPLANT
ELECT REM PT RETURN 9FT ADLT (ELECTROSURGICAL) ×2
ELECTRODE REM PT RTRN 9FT ADLT (ELECTROSURGICAL) ×1 IMPLANT
GAUZE XEROFORM 1X8 LF (GAUZE/BANDAGES/DRESSINGS) ×2 IMPLANT
GLOVE BIO SURGEON STRL SZ7 (GLOVE) ×2 IMPLANT
GLOVE INDICATOR 7.5 STRL GRN (GLOVE) ×2 IMPLANT
GLOVE SURG SYN 8.0 (GLOVE) ×2 IMPLANT
GOWN STRL REUS W/ TWL LRG LVL3 (GOWN DISPOSABLE) ×2 IMPLANT
GOWN STRL REUS W/ TWL XL LVL3 (GOWN DISPOSABLE) ×1 IMPLANT
GOWN STRL REUS W/TWL LRG LVL3 (GOWN DISPOSABLE) ×4
GOWN STRL REUS W/TWL XL LVL3 (GOWN DISPOSABLE) ×2
LABEL OR SOLS (LABEL) ×2 IMPLANT
MANIFOLD NEPTUNE II (INSTRUMENTS) ×2 IMPLANT
NEEDLE HYPO 25X1 1.5 SAFETY (NEEDLE) ×4 IMPLANT
NS IRRIG 500ML POUR BTL (IV SOLUTION) ×2 IMPLANT
PACK BASIN MINOR (MISCELLANEOUS) ×2 IMPLANT
SOL PREP PVP 2OZ (MISCELLANEOUS) ×2
SOLUTION PREP PVP 2OZ (MISCELLANEOUS) ×1 IMPLANT
SUCTION FRAZIER HANDLE 10FR (MISCELLANEOUS) ×2
SUCTION TUBE FRAZIER 10FR DISP (MISCELLANEOUS) ×1 IMPLANT
SUT MNCRL AB 4-0 PS2 18 (SUTURE) ×2 IMPLANT
SUT SILK 3 0 (SUTURE) ×2
SUT SILK 3-0 18XBRD TIE 12 (SUTURE) ×1 IMPLANT
SUT SILK 4 0 (SUTURE) ×2
SUT SILK 4-0 18XBRD TIE 12 (SUTURE) ×1 IMPLANT
SUT VIC AB 3-0 SH 27 (SUTURE) ×2
SUT VIC AB 3-0 SH 27X BRD (SUTURE) ×1 IMPLANT
SYR 10ML LL (SYRINGE) ×4 IMPLANT
SYR BULB IRRIG 60ML STRL (SYRINGE) ×2 IMPLANT

## 2020-04-16 NOTE — Telephone Encounter (Signed)
   Primary Cardiologist: Candee Furbish, MD  Chart reviewed as part of pre-operative protocol coverage. Patient was contacted 04/16/2020 in reference to pre-operative risk assessment for pending surgery as outlined below.  Kayla [name] was last seen on 04/09/20 by Dr. Caryl Comes.  Since that day, Kayla [name] has done well. She can complete 4.0 METS but is limited by back pain. She has a history of nonosbstructive CAD by heart cath 2006 and nonischemic stress test in 2013. Due to her high  CHADS2VASC score of 7, would resume eliquis as soon as safe to do so following surgery. I think her biggest risk for surgery is converting to Afib.  Therefore, based on ACC/AHA guidelines, the patient would be at acceptable risk for the planned procedure without further cardiovascular testing.   The patient was advised that if she develops new symptoms prior to surgery to contact our office to arrange for a follow-up visit, and she verbalized understanding.  I will route this recommendation to the requesting party via Epic fax function and remove from pre-op pool. Please call with questions.  Tami Lin Danique Hartsough, PA 04/16/2020, 9:52 AM

## 2020-04-16 NOTE — Op Note (Signed)
        OPERATIVE NOTE   PRE-OPERATIVE DIAGNOSIS: suspected temporal arteritis, polymyalgia rheumatica  POST-OPERATIVE DIAGNOSIS: Same as above  PROCEDURE: 1.   Left temporal artery biopsy  SURGEON: [name], MD  ASSISTANT(S): Ms. Hezzie Bump  ANESTHESIA: MAC  ESTIMATED BLOOD LOSS: Minimal  FINDING(S): 1.  none  SPECIMEN(S):  Left superficial temporal artery sent to pathology  INDICATIONS:   Patient is a 79 y.o. female who presents with polymyalgia rheumatica associated with some headache but significant jaw claudication and significant elevation of her inflammatory markers..  We are asked for consideration for temporal artery biopsy. Risks and benefits were discussed and he was agreeable to proceed.  DESCRIPTION: After obtaining full informed written consent, the patient was brought back to the operating room and placed supine upon the operating table.  The patient received IV antibiotics prior to induction.  After obtaining adequate anesthesia, the patient was prepped and draped in the standard fashion. The area in front of his left ear was anesthetized copiously with a solution of 1% lidocaine and half percent Marcaine without epinephrine. I then made an incision just in front of the left ear overlying the palpable pulse. I then dissected down through the subcutaneous tissues and identified the superficial temporal artery. This was dissected out over a several centimeters and branches were ligated and divided between silk ties. Care was used to avoid electrocautery around the artery. I then clamped the artery proximally and distally and transected the artery. The specimen was then sent to pathology. The proximal and distal artery were ligated with 3-0 silk ties. Hemostasis was achieved. The wound was then closed with a series of interrupted 3-0 Vicryl's and the skin was closed with a 4-0 Monocryl. Sterile dressing was placed. The patient was taken to the recovery room in stable  condition having tolerated the procedure well.  COMPLICATIONS: None  CONDITION: Stable   [name] 04/16/2020 7:44 PM  This note was created with Dragon Medical transcription system. Any errors in dictation are purely unintentional.

## 2020-04-16 NOTE — Progress Notes (Signed)
PERIOPERATIVE PRESCRIPTION FOR IMPLANTED CARDIAC DEVICE PROGRAMMING   Patient Information: Name: Kayla [name], Kayla [name]   DOB: [birth year]  MRN: [id number]    Karen Kitchens, NP  P Cv Div [facility] Device Planned Procedure: LEFT temporal artery biopsy  Surgeon: Dr. Leotis Pain, MD  Date of Procedure: 04/16/2020 (acute add on)  Cautery will be used.  Position during surgery: supine   Please send documentation back to:  [facility] (Fax # [phone])   Karen Kitchens, NP  04/16/20 8:57 AM     Device Information:   Clinic EP Physician:   Virl Axe, MD Device Type:  Pacemaker Manufacturer and Phone #:  [employer]tr[employer]: [phone] Pacemaker Dependent?:  No Date of Last Device Check:  04/09/2020        Normal Device Function?:  Yes     Electrophysiologist's Recommendations:    Have magnet available.  Provide continuous ECG monitoring when magnet is used or reprogramming is to be performed.   Apply magnet for asynchronous pacing during case. Call [employer] representative for magnet rate and or further recommendations.  Per Device Clinic Standing Orders, Drake Leach  04/16/2020 10:54 AM

## 2020-04-16 NOTE — Interval H&P Note (Signed)
History and Physical Interval Note:  04/16/2020 4:33 PM  Kayla [name]  has presented today for surgery, with the diagnosis of TEMPORAL ARTERITIS.  The various methods of treatment have been discussed with the patient and family. After consideration of risks, benefits and other options for treatment, the patient has consented to  Procedure(s): BIOPSY TEMPORAL ARTERY (Left) as a surgical intervention.  The patient's history has been reviewed, patient examined, no change in status, stable for surgery.  I have reviewed the patient's chart and labs.  Questions were answered to the patient's satisfaction.     [name]

## 2020-04-16 NOTE — Telephone Encounter (Signed)
Kayla Kitchens, NP  P Cv Div [facility] Request for pre-operative cardiac clearance:    1. What type of surgery is being performed?  LEFT temporal artery biopsy   2. When is this surgery scheduled?  04/16/2020 (TODAY) - patient was an acute add on to the OR schedule    3. Are there any medications that need to be held prior to surgery?  N/A due to timing of procedure.   4. Practice name and name of physician performing surgery?  Performing surgeon: Dr. [name]  Requesting clearance: Honor Loh, FNP-C     5. Anesthesia type (none, local, MAC, general)? General   6. What is the office phone and fax number?   Phone: [phone]  Fax: [phone]   ATTENTION: Unable to create telephone message as per your standard workflow. Directed by [facility] providers to send requests for cardiac clearance to this pool for appropriate distribution to provider covering pre-operative clearances.   Honor Loh, MSN, APRN, FNP-C, CEN  [facility]  Peri-operative Services Nurse Practitioner  Phone: [phone]  04/16/20 9:09 AM

## 2020-04-16 NOTE — Progress Notes (Addendum)
[facility] Perioperative Services  Pre-Admission/Anesthesia Testing Clinical Review  Date: 04/16/20  Patient Demographics:  Name: Kayla [name] DOB:   04-03-1941 MRN:   [id number]  Planned Surgical Procedure(s):    Case: [id number] Date/Time: 04/16/20 1713   Procedure: BIOPSY TEMPORAL ARTERY (Left )   Anesthesia type: General   Pre-op diagnosis: TEMPORAL ARTERITIS   Location: [place] [facility] [place] [facility]   Surgeons: Katha Cabal, MD    NOTE: Available PAT nursing documentation and vital signs have been reviewed. Clinical nursing staff has updated patient's PMH/PSHx, current medication list, and drug allergies/intolerances to ensure comprehensive history available to assist in medical decision making as it pertains to the aforementioned surgical procedure and anticipated anesthetic course.   Clinical Discussion:  Kayla [name] is a 79 y.o. female who is submitted for pre-surgical anesthesia review and clearance prior to her undergoing the above procedure. Patient is a Former Smoker (35 pack years; quit 05/1997). Pertinent PMH includes: CAD, atrial fibrillation, pulmonary hypertension, LVH, HTN, palpitations, HfpEF, HLD, T2DM, COPD, OSAH (requires nocturnal PAP therapy), hypothyroidism, GERD, anxiety (on BZO).   Patient is followed by cardiology Caryl Comes, MD). She was last seen in the cardiology clinic on 04/09/2020; notes reviewed.  At the time of her clinic visit, patient reported to have recurrent symptomatic A. fib with RVR.  Symptoms improved with recent up titration of beta-blocker.  Patient denied increased shortness of breath.  Patient with complaints of neuropathic pain in her legs and arthritic pain in her shoulders.  Despite pain, functional capacity remains >4 METS.  Patient underwent diagnostic left heart catheterization and 2006 that revealed nonobstructive CAD.  Coronary CTA done on 12/31/2017 revealed diffuse plaque with moderate  disease in the RCA and LAD; coronary calcium score 1422.  Last TTE performed on 08/13/2019 revealed normal left ventricular systolic function with an LVEF of 65-70%; no RWMA's (see full interpretation cardiovascular test below).  Patient underwent PPM placement in 11/2015 for diagnosis of junctional bradycardia and paroxysmal atrial fibrillation with RVR that is resistant to dofetilide.  AV junction ablation has been discussed in the past.  CHA2DS2-VASc Score = 7.  She is chronically anticoagulated using apixaban; compliant with therapy with no evidence of gastrointestinal bleeding.  Hypertension well controlled on currently prescribed ARB, beta-blocker, and diuretic therapy.  Patient is on a statin for her HLD.  ECG in the office revealed atrial pacing at a rate of 65 bpm.  No changes were made to her medication regimen.  Patient to follow-up with outpatient cardiology for ongoing management.  Patient scheduled to undergo a LEFT temporal artery biopsy later today with Dr. [name].  Patient was in acute add on to the OR schedule and did not come through PAT for testing.  However, with that being said due to patient's past medical history significant for cardiovascular disease, presurgical cardiac clearance was sought by the PAT team.  Per cardiology, "patient's biggest risk for surgery is converting to A. fib.  Patient doing well and can complete 4 METS of activity.  Based on ACC/AHA guidelines, the patient will be at an ACCEPTABLE risk for the planned procedure without further cardiovascular testing".   Patient reports previous perioperative complications with anesthesia.  She has a (+) history of emergence delirium. Patient advising that she "wakes up mean". Additionally, patient has a (+) of PONV "after every surgery".  Patient last underwent a MAC anesthetic course at [facility] (ASA III) on 12/15/2019 with no  documented complications.  Vitals with BMI 04/15/2020 04/09/2020 02/10/2020   Height _0  _1  -  Weight 233 lbs 231 lbs 10 oz -  BMI 44.01 02.7 -  Systolic 253 664 403  Diastolic 87 70 69  Pulse 65 65 64    Providers/Specialists:   NOTE: Primary physician provider listed below. Patient may have been seen by APP or partner within same practice.   PROVIDER ROLE LAST Anne Hahn, Dolores Lory, MD Vascular Surgery  04/15/2020  Hamrick, Lorin Mercy, MD Primary Care Provider  ?  Adam Phenix, MD Cardiology / Electrophysiology  04/09/2020   Allergies:  Dulaglutide  Current Home Medications:   No current facility-administered medications for this encounter.   Marland Kitchen acetaminophen (TYLENOL) 500 MG tablet  . albuterol (PROVENTIL) (2.5 MG/3ML) 0.083% nebulizer solution  . albuterol (VENTOLIN HFA) 108 (90 Base) MCG/ACT inhaler  . allopurinol (ZYLOPRIM) 100 MG tablet  . ascorbic acid (VITAMIN C) 500 MG tablet  . atorvastatin (LIPITOR) 40 MG tablet  . budesonide-formoterol (SYMBICORT) 160-4.5 MCG/ACT inhaler  . Dextromethorphan-guaiFENesin (TUSSIN DM MAX SUGAR-FREE PO)  . ELIQUIS 5 MG TABS tablet  . ferrous sulfate 325 (65 FE) MG tablet  . fluticasone (FLONASE) 50 MCG/ACT nasal spray  . gabapentin (NEURONTIN) 800 MG tablet  . HYDROcodone-acetaminophen (NORCO/VICODIN) 5-325 MG tablet  . insulin aspart (NOVOLOG) 100 UNIT/ML FlexPen  . insulin glargine (LANTUS) 100 UNIT/ML injection  . levothyroxine (SYNTHROID) 175 MCG tablet  . LORazepam (ATIVAN) 2 MG tablet  . losartan (COZAAR) 50 MG tablet  . magnesium oxide (MAG-OX) 400 (241.3 Mg) MG tablet  . metoprolol succinate (TOPROL-XL) 100 MG 24 hr tablet  . nitroGLYCERIN (NITROSTAT) 0.4 MG SL tablet  . Polyethylene Glycol 400 (BLINK TEARS) 0.25 % SOLN  . predniSONE (DELTASONE) 20 MG tablet  . promethazine (PHENERGAN) 25 MG suppository  . Tiotropium Bromide Monohydrate (SPIRIVA RESPIMAT) 2.5 MCG/ACT AERS  . torsemide (DEMADEX) 20 MG tablet  . vitamin B-12 (CYANOCOBALAMIN) 500 MCG tablet  . Vitamin D3 (VITAMIN D) 25 MCG  tablet  . Alcohol Swabs (B-D SINGLE USE SWABS REGULAR) PADS  . Blood Glucose Calibration (TRUE METRIX LEVEL 1) Low SOLN  . PRESCRIPTION MEDICATION  . TRUE METRIX BLOOD GLUCOSE TEST test strip  . TRUEplus Lancets 28G MISC   History:   Past Medical History:  Diagnosis Date  . Allergy   . Anxiety   . Arthritis   . CAD (coronary artery disease)    Mild per remote cath in 2006, /    Nuclear, January, 2013, no significant abnormality,  . Carotid artery disease ([place])    Doppler, January, 2013, 0 47% RIC A., 42-59% LICA, stable  . Cataract   . CHF (congestive heart failure) ([place])   . Complication of anesthesia    wakes up "mean"  . COPD 02/16/2007   Intolerant of Spiriva, tudorza Intolerant of Trelegy   . COPD (chronic obstructive pulmonary disease) ([address])   . Coronary atherosclerosis 11/24/2008   Catheterization 2006, nonobstructive coronary disease   //   nuclear 2013, low risk    . Diabetes mellitus, type 2 ([place])   . Diverticula of colon   . DIVERTICULOSIS OF COLON 03/23/2007   Qualifier: Diagnosis of  By: Halford Chessman MD, Vine[place]    . Dizziness    occasional  . Dizzy spells 05/04/2011  . DM (diabetes mellitus) ([place]) 03/23/2007   Qualifier: Diagnosis of  By: Halford Chessman MD, Vineet    . Dyspnea    On exertion  . Ejection fraction   .  Essential hypertension 02/16/2007   Qualifier: Diagnosis of  By: Rosana Hoes CMA, Tammy    . G E R D 03/23/2007   Qualifier: Diagnosis of  By: Halford Chessman MD, Vineet    . Gall bladder disease 04/28/2016  . Gastritis and gastroduodenitis   . GERD (gastroesophageal reflux disease)   . Goiter    hx of  . Headache(784.0)    migraine  . Hyperlipidemia   . Hypertension   . Hypothyroidism   . HYPOTHYROIDISM, POSTSURGICAL 06/04/2008   Qualifier: Diagnosis of  By: Loanne Drilling MD, Jacelyn Pi   . Left ventricular hypertrophy   . Leg edema    occasional swelling  . Leg swelling 12/09/2018  . Myofascial pain syndrome, cervical 01/06/2018  . Obesity   . OBESITY 11/24/2008   Qualifier:  Diagnosis of  By: Sidney Ace    . OBSTRUCTIVE SLEEP APNEA 02/16/2007   Auto CPAP 09/03/13 to 10/02/13 >> used on 30 of 30 nights with average 6 hrs and 3 min.  Average AHI is 3.1 with median CPAP 5 cm H2O and 95 th percentile CPAP 6 cm H2O.    . Occipital neuralgia of right side 01/06/2018  . OSA (obstructive sleep apnea)    cpap setting of 12  . Pain in joint of right hip 01/21/2018  . Palpitations 01/21/2011   48 hour Holter, 2015, scattered PACs and PVCs.   . Pneumonia   . PONV (postoperative nausea and vomiting)    severe after every surgery  . Pulmonary hypertension, secondary   . RHINITIS 07/21/2010       . RUQ abdominal pain   . S/P left TKA 07/27/2011  . Sinus node dysfunction ([place]) 11/08/2015  . Sleep apnea   . Spinal stenosis of cervical region 11/07/2014  . Urinary frequency 12/09/2018  . UTI (urinary tract infection) 08/15/2019   per pt report  . VENTRICULAR HYPERTROPHY, LEFT 11/24/2008   Qualifier: Diagnosis of  By: Sidney Ace     Past Surgical History:  Procedure Laterality Date  . ABDOMINAL HYSTERECTOMY     with appendectomy and colon polypectomy  . AMPUTATION TOE Left 12/15/2019   Procedure: AMPUTATION  LEFT GREAT TOE;  Surgeon: Evelina Bucy, DPM;  Location: [facility];  Service: Podiatry;  Laterality: Left;  . ANTERIOR CERVICAL DECOMP/DISCECTOMY FUSION N/A 11/07/2014   Procedure: Anterior Cervical diskectomy and fusion Cervical four-five, Cervical five-six, Cervical six-seven;  Surgeon: Kary Kos, MD;  Location: [place] [facility];  Service: Neurosurgery;  Laterality: N/A;  . APPENDECTOMY    . ARTERY BIOPSY Right 02/16/2017   Procedure: BIOPSY TEMPORAL ARTERY;  Surgeon: Katha Cabal, MD;  Location: [facility] ORS;  Service: Vascular;  Laterality: Right;  . BACK SURGERY    . CARDIAC CATHETERIZATION    . CATARACT EXTRACTION W/ INTRAOCULAR LENS  IMPLANT, BILATERAL Bilateral   . CHOLECYSTECTOMY N/A 04/29/2016   Procedure: LAPAROSCOPIC CHOLECYSTECTOMY WITH INTRAOPERATIVE  CHOLANGIOGRAM;  Surgeon: Alphonsa Overall, MD;  Location: [facility];  Service: General;  Laterality: N/A;  . EP IMPLANTABLE DEVICE N/A 11/08/2015   MRI COMPATABLE -- Procedure: Pacemaker Implant;  Surgeon: Deboraha Sprang, MD;  Location: [facility];  Service: Cardiovascular;  Laterality: N/A;  . INSERT / REPLACE / REMOVE PACEMAKER  2017   Dr. Jens Som [facility])  . JOINT REPLACEMENT    . LUMBAR DISC SURGERY  1990's   x 2  . THYROIDECTOMY  2011  . TOTAL KNEE ARTHROPLASTY  07/27/2011   Procedure: TOTAL KNEE ARTHROPLASTY;  Surgeon: Mauri Pole,  MD;  Location: [facility];  Service: Orthopedics;  Laterality: Left;  . UPPER ESOPHAGEAL ENDOSCOPIC ULTRASOUND (EUS) N/A 05/14/2016   Procedure: UPPER ESOPHAGEAL ENDOSCOPIC ULTRASOUND (EUS);  Surgeon: Milus Banister, MD;  Location: [facility];  Service: Endoscopy;  Laterality: N/A;  radial only-will be done mod if needed   Family History  Problem Relation Age of Onset  . Heart Problems Mother   . Heart defect Mother        valve problems  . Heart Problems Father   . Heart disease Brother   . Lung cancer Daughter        non small cell   . Diabetes Maternal Aunt   . Diabetes Maternal Uncle   . Throat cancer Brother   . Colon cancer Neg Hx   . Colon polyps Neg Hx   . Esophageal cancer Neg Hx   . Rectal cancer Neg Hx   . Stomach cancer Neg Hx    Social History   Tobacco Use  . Smoking status: Former Smoker    Packs/day: 2.00    Years: 33.00    Pack years: 66.00    Types: Cigarettes    Start date: 1966    Quit date: 05/04/1997    Years since quitting: 22.9  . Smokeless tobacco: Never Used  Vaping Use  . Vaping Use: Never used  Substance Use Topics  . Alcohol use: No  . Drug use: No    Pertinent Clinical Results:  LABS: Labs reviewed: Acceptable for surgery.   Hospital Outpatient Visit on 04/16/2020  Component Date Value Ref Range Status  . SARS Coronavirus 2 04/16/2020 NEGATIVE  NEGATIVE Final   Comment: (NOTE) SARS-CoV-2 target  nucleic acids are NOT DETECTED.  The SARS-CoV-2 RNA is generally detectable in upper and lower respiratory specimens during the acute phase of infection. The lowest concentration of SARS-CoV-2 viral copies this assay can detect is 250 copies / mL. A negative result does not preclude SARS-CoV-2 infection and should not be used as the sole basis for treatment or other patient management decisions.  A negative result may occur with improper specimen collection / handling, submission of specimen other than nasopharyngeal swab, presence of viral mutation(s) within the areas targeted by this assay, and inadequate number of viral copies (<250 copies / mL). A negative result must be combined with clinical observations, patient history, and epidemiological information.  Fact Sheet for Patients:   StrictlyIdeas.no  Fact Sheet for Healthcare Providers: BankingDealers.co.za  This test is not yet approved or                           cleared by the [place] FDA and has been authorized for detection and/or diagnosis of SARS-CoV-2 by FDA under an Emergency Use Authorization (EUA).  This EUA will remain in effect (meaning this test can be used) for the duration of the COVID-19 declaration under Section 564(b)(1) of the Act, 21 U.S.C. section 360bbb-3(b)(1), unless the authorization is terminated or revoked sooner.  Performed at [facility], [address] [place], [place] [address]     ECG: Date: 04/09/2020  Time ECG obtained: 1418 PM Rate: 65 bpm Rhythm: Atrial paced rhythm Axis (leads I and aVF): Normal Intervals: PR 210 ms. QRS 80 ms. QTc 432 ms. ST segment and T wave changes: No evidence of acute ST segment elevation or depression Comparison: Similar to previous tracing obtained on 12/28/2019   IMAGING / PROCEDURES: ECHOCARDIOGRAM done on 08/13/2019  1. Left ventricular ejection fraction, by estimation, is 65 to 70%. The  left ventricle has normal function. The left ventricle has no regional wall motion abnormalities. There is moderate concentric left ventricular hypertrophy. Unable to assess LV diastolic filling due to underlying atrial fibrillation.  2. Right ventricular systolic function is normal. The right ventricular size is normal. There is normal pulmonary artery systolic pressure.  3. Right atrial size was moderately dilated. 4. The mitral valve is normal in structure. No evidence of mitral valve regurgitation. No evidence of mitral stenosis.  5. The aortic valve is tricuspid. Aortic valve regurgitation is not visualized. Mild aortic valve sclerosis is present, with no evidence of aortic valve stenosis.  6. The inferior vena cava is dilated in size with >50% respiratory variability, suggesting right atrial pressure of 8 mmHg.   CORONARY CTA done on 12/31/2017 1. Coronary calcium score of 1422. This was 33 percentile for ageand sex matched control. 2. Normal coronary origin with right dominance. 3. Diffuse plaque with moderate disease in the RCA and LAD.   Impression and Plan:  Kayla [name] has been referred for pre-anesthesia review and clearance prior to her undergoing the planned anesthetic and procedural courses. Available labs, pertinent testing, and imaging results were personally reviewed by me. This patient has been appropriately cleared by cardiology with an ACCEPTABLE risk stratification.  Perioperative prescription for intraoperative cardiac device programming documentation has been completed by electrophysiologist's office and is on patient's chart for review by [facility] team.  Based on clinical review performed today (04/16/20), barring any significant acute changes in the patient's overall condition, it is anticipated that she will be able to proceed with the planned surgical intervention. Any acute changes in clinical condition may necessitate her procedure being postponed and/or  cancelled. Pre-surgical instructions were reviewed with the patient during her PAT appointment and questions were fielded by PAT clinical staff.  Honor Loh, MSN, APRN, FNP-C, CEN [facility]  Peri-operative Services Nurse Practitioner Phone: [phone] 04/16/20 10:19 AM  NOTE: This note has been prepared using Dragon dictation software. Despite my best ability to proofread, there is always the potential that unintentional transcriptional errors may still occur from this process.

## 2020-04-16 NOTE — Transfer of Care (Signed)
Immediate Anesthesia Transfer of Care Note  Patient: Kayla [name]  Procedure(s) Performed: BIOPSY TEMPORAL ARTERY (Left Head)  Patient Location: PACU  Anesthesia Type:General  Level of Consciousness: awake, alert , oriented and patient cooperative  Airway & Oxygen Therapy: Patient Spontanous Breathing and Patient connected to face mask oxygen  Post-op Assessment: Report given to RN and Post -op Vital signs reviewed and stable  Post vital signs: Reviewed and stable  Last Vitals:  Vitals Value Taken Time  BP 141/65 04/16/20 1806  Temp 36.2 C 04/16/20 1805  Pulse 64 04/16/20 1811  Resp 20 04/16/20 1811  SpO2 96 % 04/16/20 1811  Vitals shown include unvalidated device data.  Last Pain:  Vitals:   04/16/20 1805  TempSrc:   PainSc: 0-No pain         Complications: No complications documented.

## 2020-04-16 NOTE — Anesthesia Preprocedure Evaluation (Addendum)
Anesthesia Evaluation  Patient identified by MRN, date of birth, ID band Patient awake    Reviewed: Allergy & Precautions, NPO status , Patient's Chart, lab work & pertinent test results  History of Anesthesia Complications (+) PONV and history of anesthetic complications  Airway Mallampati: III       Dental   Pulmonary sleep apnea and Continuous Positive Airway Pressure Ventilation , COPD,  COPD inhaler, Not current smoker, former smoker,           Cardiovascular hypertension, Pt. on medications + CAD and +CHF  + pacemaker      Neuro/Psych neg Seizures Anxiety    GI/Hepatic Neg liver ROS, GERD  Medicated and Controlled,  Endo/Other  diabetes, Type 2, Oral Hypoglycemic AgentsHypothyroidism   Renal/GU Renal InsufficiencyRenal disease     Musculoskeletal   Abdominal   Peds  Hematology   Anesthesia Other Findings   Reproductive/Obstetrics                            Anesthesia Physical Anesthesia Plan  ASA: III  Anesthesia Plan: General   Post-op Pain Management:    Induction: Intravenous  PONV Risk Score and Plan: 4 or greater and Propofol infusion, TIVA and Treatment may vary due to age or medical condition  Airway Management Planned:   Additional Equipment:   Intra-op Plan:   Post-operative Plan:   Informed Consent: I have reviewed the patients History and Physical, chart, labs and discussed the procedure including the risks, benefits and alternatives for the proposed anesthesia with the patient or authorized representative who has indicated his/her understanding and acceptance.       Plan Discussed with:   Anesthesia Plan Comments:         Anesthesia Quick Evaluation

## 2020-04-17 ENCOUNTER — Encounter: Payer: Self-pay | Admitting: Vascular Surgery

## 2020-04-17 ENCOUNTER — Other Ambulatory Visit: Payer: Medicare HMO

## 2020-04-18 LAB — SURGICAL PATHOLOGY

## 2020-04-18 NOTE — Anesthesia Postprocedure Evaluation (Signed)
Anesthesia Post Note  Patient: Kayla [name]  Procedure(s) Performed: BIOPSY TEMPORAL ARTERY (Left Head)  Patient location during evaluation: PACU Anesthesia Type: General Level of consciousness: awake and alert Pain management: pain level controlled Vital Signs Assessment: post-procedure vital signs reviewed and stable Respiratory status: spontaneous breathing, nonlabored ventilation, respiratory function stable and patient connected to nasal cannula oxygen Cardiovascular status: blood pressure returned to baseline and stable Postop Assessment: no apparent nausea or vomiting Anesthetic complications: no   No complications documented.   Last Vitals:  Vitals:   04/16/20 1838 04/16/20 1840  BP: (!) 158/63 (!) 158/63  Pulse: (!) 59 60  Resp: 20 14  Temp:  36.9 C  SpO2: 96% 95%    Last Pain:  Vitals:   04/16/20 1840  TempSrc:   PainSc: 0-No pain                 Arita Miss

## 2020-04-30 DIAGNOSIS — Z79899 Other long term (current) drug therapy: Secondary | ICD-10-CM | POA: Diagnosis not present

## 2020-04-30 DIAGNOSIS — G8929 Other chronic pain: Secondary | ICD-10-CM | POA: Diagnosis not present

## 2020-04-30 DIAGNOSIS — Z6838 Body mass index (BMI) 38.0-38.9, adult: Secondary | ICD-10-CM | POA: Diagnosis not present

## 2020-04-30 DIAGNOSIS — Z1331 Encounter for screening for depression: Secondary | ICD-10-CM | POA: Diagnosis not present

## 2020-04-30 DIAGNOSIS — E1149 Type 2 diabetes mellitus with other diabetic neurological complication: Secondary | ICD-10-CM | POA: Diagnosis not present

## 2020-04-30 DIAGNOSIS — E1140 Type 2 diabetes mellitus with diabetic neuropathy, unspecified: Secondary | ICD-10-CM | POA: Diagnosis not present

## 2020-04-30 DIAGNOSIS — M5441 Lumbago with sciatica, right side: Secondary | ICD-10-CM | POA: Diagnosis not present

## 2020-04-30 DIAGNOSIS — Z89412 Acquired absence of left great toe: Secondary | ICD-10-CM | POA: Diagnosis not present

## 2020-04-30 DIAGNOSIS — M353 Polymyalgia rheumatica: Secondary | ICD-10-CM | POA: Diagnosis not present

## 2020-04-30 DIAGNOSIS — E1165 Type 2 diabetes mellitus with hyperglycemia: Secondary | ICD-10-CM | POA: Diagnosis not present

## 2020-04-30 DIAGNOSIS — E890 Postprocedural hypothyroidism: Secondary | ICD-10-CM | POA: Diagnosis not present

## 2020-05-09 ENCOUNTER — Ambulatory Visit (INDEPENDENT_AMBULATORY_CARE_PROVIDER_SITE_OTHER): Payer: Medicare HMO | Admitting: Vascular Surgery

## 2020-05-15 ENCOUNTER — Encounter: Payer: Self-pay | Admitting: Pulmonary Disease

## 2020-05-15 ENCOUNTER — Ambulatory Visit (INDEPENDENT_AMBULATORY_CARE_PROVIDER_SITE_OTHER): Payer: Medicare HMO | Admitting: Pulmonary Disease

## 2020-05-15 ENCOUNTER — Other Ambulatory Visit: Payer: Self-pay

## 2020-05-15 VITALS — BP 128/78 | HR 67 | Ht 66.0 in | Wt 240.2 lb

## 2020-05-15 DIAGNOSIS — J449 Chronic obstructive pulmonary disease, unspecified: Secondary | ICD-10-CM | POA: Diagnosis not present

## 2020-05-15 DIAGNOSIS — G4733 Obstructive sleep apnea (adult) (pediatric): Secondary | ICD-10-CM | POA: Diagnosis not present

## 2020-05-15 DIAGNOSIS — Z9989 Dependence on other enabling machines and devices: Secondary | ICD-10-CM | POA: Diagnosis not present

## 2020-05-15 MED ORDER — ALBUTEROL SULFATE (2.5 MG/3ML) 0.083% IN NEBU: 2.5000 mg | INHALATION_SOLUTION | RESPIRATORY_TRACT | 5 refills | Status: DC | PRN

## 2020-05-15 MED ORDER — BREZTRI AEROSPHERE 160-9-4.8 MCG/ACT IN AERO: 2.0000 | INHALATION_SPRAY | Freq: Two times a day (BID) | RESPIRATORY_TRACT | 0 refills | Status: DC

## 2020-05-15 MED ORDER — ALBUTEROL SULFATE HFA 108 (90 BASE) MCG/ACT IN AERS: 2.0000 | INHALATION_SPRAY | Freq: Four times a day (QID) | RESPIRATORY_TRACT | 3 refills | Status: DC | PRN

## 2020-05-15 NOTE — Patient Instructions (Signed)
Try using breztri two puffs in the morning and two puffs in the evening, and rinse your mouth after each use  Will arrange for a spacer device to use with your inhalers  Stop using spiriva and symbicort while using breztri  Call in two weeks to update your status after changing to breztri  Follow up in 3 months

## 2020-05-15 NOTE — Progress Notes (Signed)
[facility]  Chief Complaint  Patient presents with  . Follow-up    Pt states she has had some hard times since last visit. States she has been experiencing more shortness of breath. Pt also has had some coughing which is due to her COPD.    Constitutional:  BP 128/78 (BP Location: Left Wrist, Cuff Size: Large)   Pulse 67   Ht 5\' 6"  (1.676 m)   Wt 240 lb 3.2 oz (109 kg)   SpO2 97%   BMI 38.77 kg/m   Past Medical History:  Hypothyroidism s/p thyroidectomy, HTN, HLD, HA, GERD, Diverticulosis, DM, Cataract, CAD, OA, Anxiety, Allergies  Past Surgical History:  She  has a past surgical history that includes Thyroidectomy (2011); Total knee arthroplasty (07/27/2011); Abdominal hysterectomy; Anterior cervical decomp/discectomy fusion (N/A, 11/07/2014); Cardiac catheterization (N/A, 11/08/2015); Lumbar disc surgery (1990's); Cholecystectomy (N/A, 04/29/2016); Upper esophageal endoscopic ultrasound (eus) (N/A, 05/14/2016); Cataract extraction w/ intraocular lens  implant, bilateral (Bilateral); Insert / replace / remove pacemaker (2017); Cardiac catheterization; Back surgery; Appendectomy; Joint replacement; Artery Biopsy (Right, 02/16/2017); Amputation toe (Left, 12/15/2019); and Artery Biopsy (Left, 04/16/2020).  Brief Summary:  Kayla [name] is a 80 y.o. female former smoker with COPD and obstructive sleep apnea.      Subjective:   She has been using spiriva and symbicort.  She tried trelegy, but made her feel nauseous.  She is getting more cough and wheeze.  She brings up clear sputum.  Has been getting more short of breath with activity also.  Feels pressure in her chest when she is a fib.  Has Rt leg swelling since she fractured her leg several months ago.  Uses CPAP nightly w/o difficulty.  Physical Exam:   Appearance - well kempt   ENMT - no sinus tenderness, no oral exudate, no LAN, Mallampati 3 airway, no stridor, wears  dentures  Respiratory - equal breath sounds bilaterally, no wheezing or rales  CV - s1s2 regular rate and rhythm, no murmurs  Ext - no clubbing, no edema  Skin - no rashes  Psych - normal mood and affect   Pulmonary testing:   Spirometry 10/19/08 >>FEV1 1.55 (68%), FEV1% 70   Spirometry 08/29/12 >> FEV1 1.38 (59%), FEV1% 58  FeNO 03/09/16 >> 7  Chest Imaging:    Sleep Tests:   PSG 02/2005 >>AHI 10   CPAP titration 02/19/14 >> CPAP 11 cm H2O >> AHI 0.7, PLMI 81.4, did not need supplemental oxygen  Auto CPAP 04/14/20 to 05/13/20 >> used on 30 of 30 nights with average 8 hrs 38 min.  Average AHI 1.8 with median CPAP 7 and 95 th percentile CPAP 10 cm H2O.  Cardiac Tests:   Echo 08/13/19 >> EF 65 to 70%, mod LVH, mod RA dilation  Social History:  She  reports that she quit smoking about 23 years ago. Her smoking use included cigarettes. She started smoking about 56 years ago. She has a 66.00 pack-year smoking history. She has never used smokeless tobacco. She reports that she does not drink alcohol and does not use drugs.  Family History:  Her family history includes Diabetes in her maternal aunt and maternal uncle; Heart Problems in her father and mother; Heart defect in her mother; Heart disease in her brother; Lung cancer in her daughter; Throat cancer in her brother.     Assessment/Plan:   COPD with chronic bronchitis. - intolerant of trelegy and daliresp - did better with incruse, but wasn't  covered by insurance - spiriva not as effective and has more cough since switching to this - will have her try sample of breztri in place of spiriva and symicort; she will call for refill if she feels this works better - will arrange for spacer device - if she still has trouble with her breathing, then will need additional testing  Obstructive sleep apnea. - she is compliant with CPAP and reports benefit from CPAP - she uses [employer] for her DME - continue auto CPAP 5 to 15 cm  H2O  Obesity. - discussed importance of weight loss  Sinus node dysfunction, PAF, S/P PM. - followed by Dr. Caryl Comes with [facility]  Time Spent Involved in Patient Care on Day of Examination:  32 minutes  Follow up:  Patient Instructions  Try using breztri two puffs in the morning and two puffs in the evening, and rinse your mouth after each use  Will arrange for a spacer device to use with your inhalers  Stop using spiriva and symbicort while using breztri  Call in two weeks to update your status after changing to breztri  Follow up in 3 months   Medication List:   Allergies as of 05/15/2020      Reactions   Dulaglutide Nausea Only, Other (See Comments), Cough   Made the patient sick to her stomach (only) several days after using it Trulicity      Medication List       Accurate as of May 15, 2020  9:23 AM. If you have any questions, ask your nurse or doctor.        STOP taking these medications   Blink Tears 0.25 % Soln Generic drug: Polyethylene Glycol 400 Stopped by: Chesley Mires, MD   promethazine 25 MG suppository Commonly known as: PHENERGAN Stopped by: Chesley Mires, MD     TAKE these medications   acetaminophen 500 MG tablet Commonly known as: TYLENOL Take 1,000 mg by mouth every 6 (six) hours as needed for moderate pain (for headaches).   albuterol (2.5 MG/3ML) 0.083% nebulizer solution Commonly known as: PROVENTIL Take 2.5 mg by nebulization every 2 (two) hours as needed for wheezing or shortness of breath.   albuterol 108 (90 Base) MCG/ACT inhaler Commonly known as: Ventolin HFA Inhale 2 puffs into the lungs every 6 (six) hours as needed for wheezing or shortness of breath.   allopurinol 100 MG tablet Commonly known as: ZYLOPRIM Take 100 mg by mouth 2 (two) times daily.   ascorbic acid 500 MG tablet Commonly known as: VITAMIN C Take 500 mg by mouth every morning.   atorvastatin 40 MG tablet Commonly known as: LIPITOR Take 1  tablet (40 mg total) by mouth daily. What changed: when to take this   B-D SINGLE USE SWABS REGULAR Pads   budesonide-formoterol 160-4.5 MCG/ACT inhaler Commonly known as: SYMBICORT Inhale 2 puffs into the lungs 2 (two) times daily.   budesonide-formoterol 160-4.5 MCG/ACT inhaler Commonly known as: Symbicort Inhale 2 puffs into the lungs 2 (two) times daily for 1 day.   cycloSPORINE 0.05 % ophthalmic emulsion Commonly known as: RESTASIS Place 1 drop into both eyes 2 (two) times daily.   Eliquis 5 MG Tabs tablet Generic drug: apixaban TAKE 1 TABLET TWICE DAILY What changed: how much to take   ferrous sulfate 325 (65 FE) MG tablet Take 1 tablet (325 mg total) by mouth daily with breakfast.   fluticasone 50 MCG/ACT nasal spray Commonly known as: FLONASE USE 2 SPRAYS NASALLY DAILY  What changed:   how much to take  how to take this  when to take this  reasons to take this  additional instructions   gabapentin 800 MG tablet Commonly known as: NEURONTIN Take 800 mg by mouth See admin instructions. Take 1 tablet (800 mg) by mouth 3-4 times daily as needed for nerve pain.   HYDROcodone-acetaminophen 5-325 MG tablet Commonly known as: Norco Take 1 tablet by mouth every 6 (six) hours as needed for moderate pain or severe pain.   insulin aspart 100 UNIT/ML FlexPen Commonly known as: NOVOLOG Inject 10-17 Units into the skin See admin instructions. Inject 30-70 units into the skin three times a day with meals, per sliding scale (and an additional 1 unit for every 50 points for a BGL >130) Sliding scale   insulin glargine 100 UNIT/ML injection Commonly known as: LANTUS Inject 130 Units into the skin in the morning and at bedtime.   levothyroxine 200 MCG tablet Commonly known as: SYNTHROID Take 175 mcg by mouth daily before breakfast.   LORazepam 2 MG tablet Commonly known as: ATIVAN Take 2 mg by mouth at bedtime as needed for anxiety.   losartan 50 MG  tablet Commonly known as: COZAAR Take 50 mg by mouth daily.   losartan 50 MG tablet Commonly known as: COZAAR Take 1 tablet (50 mg total) by mouth daily.   magnesium oxide 400 (241.3 Mg) MG tablet Commonly known as: MAG-OX Take 400 mg by mouth 3 (three) times daily.   metoprolol succinate 100 MG 24 hr tablet Commonly known as: TOPROL-XL Take 100 mg by mouth daily. Take with or immediately following a meal. What changed: Another medication with the same name was changed. Make sure you understand how and when to take each.   metoprolol succinate 100 MG 24 hr tablet Commonly known as: TOPROL-XL Take 1 tablet (100 mg total) by mouth daily. Take with or immediately following a meal. What changed: when to take this   nitroGLYCERIN 0.4 MG SL tablet Commonly known as: NITROSTAT Place 1 tablet (0.4 mg total) under the tongue every 5 (five) minutes as needed for chest pain. What changed: when to take this   predniSONE 10 MG tablet Commonly known as: DELTASONE Take 10 mg by mouth daily.   PRESCRIPTION MEDICATION CPAP: At bedtime and "as needed for shortness of breath"   Spiriva Respimat 2.5 MCG/ACT Aers Generic drug: Tiotropium Bromide Monohydrate Inhale 2 puffs into the lungs daily.   torsemide 20 MG tablet Commonly known as: DEMADEX Take 2 tablets (40 mg total) by mouth every other day.   True Metrix Blood Glucose Test test strip Generic drug: glucose blood   True Metrix Level 1 Low Soln   TRUEplus Lancets 28G Misc   TUSSIN DM MAX SUGAR-FREE PO Take 5 mLs by mouth every 4 (four) hours as needed (for coughing).   vitamin B-12 500 MCG tablet Commonly known as: CYANOCOBALAMIN Take 500 mcg by mouth every morning.   Vitamin D3 25 MCG tablet Commonly known as: Vitamin D Take 1,000 Units by mouth every morning.       Signature:  Chesley Mires, MD [place] Pager - [phone] 05/15/2020, 9:23 AM

## 2020-05-15 NOTE — Addendum Note (Signed)
Addended by: Lorretta Harp on: 05/15/2020 10:32 AM   Modules accepted: Orders

## 2020-05-21 ENCOUNTER — Ambulatory Visit: Payer: Medicare HMO | Admitting: Family Medicine

## 2020-05-28 ENCOUNTER — Encounter: Payer: Self-pay | Admitting: Family Medicine

## 2020-05-28 ENCOUNTER — Other Ambulatory Visit: Payer: Self-pay

## 2020-05-28 ENCOUNTER — Ambulatory Visit (INDEPENDENT_AMBULATORY_CARE_PROVIDER_SITE_OTHER): Payer: Medicare HMO

## 2020-05-28 ENCOUNTER — Ambulatory Visit: Payer: Medicare HMO | Admitting: Family Medicine

## 2020-05-28 DIAGNOSIS — S82141D Displaced bicondylar fracture of right tibia, subsequent encounter for closed fracture with routine healing: Secondary | ICD-10-CM

## 2020-05-28 DIAGNOSIS — M79604 Pain in right leg: Secondary | ICD-10-CM

## 2020-05-28 DIAGNOSIS — R0609 Other forms of dyspnea: Secondary | ICD-10-CM

## 2020-05-28 DIAGNOSIS — M25551 Pain in right hip: Secondary | ICD-10-CM

## 2020-05-28 DIAGNOSIS — M7989 Other specified soft tissue disorders: Secondary | ICD-10-CM

## 2020-05-28 DIAGNOSIS — R0600 Dyspnea, unspecified: Secondary | ICD-10-CM | POA: Diagnosis not present

## 2020-05-28 NOTE — Progress Notes (Signed)
Office Visit Note   Patient: Kayla [name]           Date of Birth: [birth year]           MRN: [id number] Visit Date: 05/28/2020 Requested by: [name], MD [place],  [place] [id number] PCP: [name], MD  Subjective: Chief Complaint  Patient presents with  . Right Knee - Pain    Knee still throbs at night. S/p tibial plateau fracture from fall on 02/10/20. Does not have as much pain in the knee with walking (but it does hurt in the hip). Ambulating with a rollator (in wheelchair today).  . Right Hip - Pain    Continues to have pain in the posterior hip, especially with walking. Hurts in the groin area, as well.    HPI: She is here with persistent right knee pain, as well as right posterior hip pain.  Is been about 3 months since she sustained a lateral tibial plateau fracture.  She states that her pain is better when walking, but the knee aches and throbs at night and her leg remains swollen.  She has a lot of pain in the posterior hip with ambulation.  She states that she is currently on prednisone at 30 mg daily for "arthritis pain", but it does not seem to be having any effect on her hip and knee.  She does have a history of COPD, but she has been more short of breath than usual in the past few days.                ROS:   All other systems were reviewed and are negative.  Objective: Vital Signs: There were no vitals taken for this visit.  Physical Exam:  General:  Alert and oriented, in no acute distress. Pulm:  Breathing unlabored. Psy:  Normal mood, congruent affect. Skin: There is 2+ pitting edema in the right leg to the knee. Right hip: She is tender primarily near the right SI joint.  She has a little bit of pain over the greater trochanter.  Minimal groin pain with internal hip rotation. Right knee: 1+ effusion, no warmth.  Tender still over the lateral tibial plateau.    Imaging: XR HIP UNILAT W OR W/O PELVIS 2-3 VIEWS RIGHT  Result  Date: 05/28/2020 X-rays of the right hip are difficult to visualize due to body habitus.  There is mild narrowing of the hip joint space, no obvious fracture seen.  Likewise in the pelvis, no obvious fracture seen.  XR Knee 1-2 Views Right  Result Date: 05/28/2020 X-rays of the right knee show no collapse of the lateral tibial plateau.  I do not see any further evidence of fracture.  She has moderate to severe medial compartment DJD.   Assessment & Plan: 1.  Persistent right hip pain, suspect due to sacroiliac dysfunction.  Cannot rule out insufficiency fracture. -Three-phase bone scan to further evaluate.  She cannot have MRI scans due to pacemaker. -If negative for fracture, consider SI joint injection per Dr. Ernestina Patches.  2.  Ongoing right knee pain with effusion -Three-phase bone scan to rule out persistent fracture.  If negative, could contemplate narrowed injection.  3.  Right leg edema and pain, cannot rule out DVT -D-dimer drawn today.  If positive, then Doppler studies and possibly chest CT.     Procedures: No procedures performed        PMFS History: Patient Active Problem List   Diagnosis  Date Noted  . Polymyalgia rheumatica ([place]) 04/15/2020  . Temporal arteritis ([place]) 04/15/2020  . Pacemaker - MDT 01/24/2020  . Orthostatic lightheadedness 01/24/2020  . Chronic osteomyelitis involving left ankle and foot ([place])   . Paroxysmal A-fib ([place]) 10/30/2019  . Paroxysmal atrial fibrillation ([place]) 10/25/2019  . Secondary hypercoagulable state ([place]) 10/25/2019  . Numbness and tingling of right leg 10/19/2019  . Lumbar radiculopathy 10/19/2019  . Acute exacerbation of CHF (congestive heart failure) ([place]) 08/13/2019  . COPD with acute exacerbation ([place]) 08/13/2019  . Acute respiratory failure ([place]) 08/13/2019  . Chest pain 08/13/2019  . Leukocytosis 08/13/2019  . Leg swelling 12/09/2018  . Urinary frequency 12/09/2018  . Pain in joint of right hip 01/21/2018  . Myofascial  pain syndrome, cervical 01/06/2018  . Occipital neuralgia of right side 01/06/2018  . RUQ abdominal pain   . Gastritis and gastroduodenitis   . Bile duct abnormality   . Cholecystitis 04/30/2016  . Gall bladder disease 04/28/2016  . Sinus node dysfunction ([place]) 11/08/2015  . Ejection fraction   . Spinal stenosis of cervical region 11/07/2014  . Preoperative clearance 10/09/2014  . Carotid artery disease ([place])   . S/P left TKA 07/27/2011  . Dizzy spells 05/04/2011  . Palpitations 01/21/2011  . RHINITIS 07/21/2010  . Hyperlipidemia 11/24/2008  . OBESITY 11/24/2008  . Coronary atherosclerosis 11/24/2008  . VENTRICULAR HYPERTROPHY, LEFT 11/24/2008  . HYPOTHYROIDISM, POSTSURGICAL 06/04/2008  . Headache 06/04/2008  . DM (diabetes mellitus) ([place]) 03/23/2007  . G E R D 03/23/2007  . DIVERTICULOSIS OF COLON 03/23/2007  . GENERALIZED OSTEOARTHROSIS UNSPECIFIED SITE 03/23/2007  . OBSTRUCTIVE SLEEP APNEA 02/16/2007  . Essential hypertension 02/16/2007  . COPD 02/16/2007   Past Medical History:  Diagnosis Date  . A-fib ([place])   . Allergy   . Anxiety   . Arthritis   . CAD (coronary artery disease)    Mild per remote cath in 2006, /    Nuclear, January, 2013, no significant abnormality,  . Carotid artery disease ([place])    Doppler, January, 2013, 0 70% RIC A., 62-37% LICA, stable  . Cataract   . CHF (congestive heart failure) ([place])   . Complication of anesthesia    wakes up "mean"  . COPD 02/16/2007   Intolerant of Spiriva, tudorza Intolerant of Trelegy   . COPD (chronic obstructive pulmonary disease) ([place])   . Coronary atherosclerosis 11/24/2008   Catheterization 2006, nonobstructive coronary disease   //   nuclear 2013, low risk    . Diabetes mellitus, type 2 ([place])   . Diverticula of colon   . DIVERTICULOSIS OF COLON 03/23/2007   Qualifier: Diagnosis of  By: Halford Chessman MD, [place]    . Dizziness    occasional  . Dizzy spells 05/04/2011  . DM (diabetes mellitus) ([place]) 03/23/2007    Qualifier: Diagnosis of  By: Halford Chessman MD, Vineet    . Dyspnea    On exertion  . Ejection fraction   . Essential hypertension 02/16/2007   Qualifier: Diagnosis of  By: Rosana Hoes CMA, Tammy    . G E R D 03/23/2007   Qualifier: Diagnosis of  By: Halford Chessman MD, Vineet    . Gall bladder disease 04/28/2016  . Gastritis and gastroduodenitis   . GERD (gastroesophageal reflux disease)   . Goiter    hx of  . Headache(784.0)    migraine  . Hyperlipidemia   . Hypertension   . Hypothyroidism   . HYPOTHYROIDISM, POSTSURGICAL 06/04/2008   Qualifier: Diagnosis of  By: Loanne Drilling MD,  Sean A   . Left ventricular hypertrophy   . Leg edema    occasional swelling  . Leg swelling 12/09/2018  . Myofascial pain syndrome, cervical 01/06/2018  . Obesity   . OBESITY 11/24/2008   Qualifier: Diagnosis of  By: Sidney Ace    . OBSTRUCTIVE SLEEP APNEA 02/16/2007   Auto CPAP 09/03/13 to 10/02/13 >> used on 30 of 30 nights with average 6 hrs and 3 min.  Average AHI is 3.1 with median CPAP 5 cm H2O and 95 th percentile CPAP 6 cm H2O.    . Occipital neuralgia of right side 01/06/2018  . OSA (obstructive sleep apnea)    cpap setting of 12  . Pain in joint of right hip 01/21/2018  . Palpitations 01/21/2011   48 hour Holter, 2015, scattered PACs and PVCs.   . Pneumonia   . PONV (postoperative nausea and vomiting)    severe after every surgery  . Pulmonary hypertension, secondary   . RHINITIS 07/21/2010       . RUQ abdominal pain   . S/P left TKA 07/27/2011  . Sinus node dysfunction ([place]) 11/08/2015  . Sleep apnea   . Spinal stenosis of cervical region 11/07/2014  . Urinary frequency 12/09/2018  . UTI (urinary tract infection) 08/15/2019   per pt report  . VENTRICULAR HYPERTROPHY, LEFT 11/24/2008   Qualifier: Diagnosis of  By: Sidney Ace      Family History  Problem Relation Age of Onset  . Heart Problems Mother   . Heart defect Mother        valve problems  . Heart Problems Father   . Heart disease Brother   . Lung cancer  Daughter        non small cell   . Diabetes Maternal Aunt   . Diabetes Maternal Uncle   . Throat cancer Brother   . Colon cancer Neg Hx   . Colon polyps Neg Hx   . Esophageal cancer Neg Hx   . Rectal cancer Neg Hx   . Stomach cancer Neg Hx     Past Surgical History:  Procedure Laterality Date  . ABDOMINAL HYSTERECTOMY     with appendectomy and colon polypectomy  . AMPUTATION TOE Left 12/15/2019   Procedure: AMPUTATION  LEFT GREAT TOE;  Surgeon: Evelina Bucy, DPM;  Location: [facility];  Service: Podiatry;  Laterality: Left;  . ANTERIOR CERVICAL DECOMP/DISCECTOMY FUSION N/A 11/07/2014   Procedure: Anterior Cervical diskectomy and fusion Cervical four-five, Cervical five-six, Cervical six-seven;  Surgeon: Kary Kos, MD;  Location: [facility];  Service: Neurosurgery;  Laterality: N/A;  . APPENDECTOMY    . ARTERY BIOPSY Right 02/16/2017   Procedure: BIOPSY TEMPORAL ARTERY;  Surgeon: Katha Cabal, MD;  Location: [facility] ORS;  Service: Vascular;  Laterality: Right;  . ARTERY BIOPSY Left 04/16/2020   Procedure: BIOPSY TEMPORAL ARTERY;  Surgeon: Katha Cabal, MD;  Location: [facility] ORS;  Service: Vascular;  Laterality: Left;  LEFT TEMPLE  . BACK SURGERY    . CARDIAC CATHETERIZATION    . CATARACT EXTRACTION W/ INTRAOCULAR LENS  IMPLANT, BILATERAL Bilateral   . CHOLECYSTECTOMY N/A 04/29/2016   Procedure: LAPAROSCOPIC CHOLECYSTECTOMY WITH INTRAOPERATIVE CHOLANGIOGRAM;  Surgeon: Alphonsa Overall, MD;  Location: [facility];  Service: General;  Laterality: N/A;  . EP IMPLANTABLE DEVICE N/A 11/08/2015   MRI COMPATABLE -- Procedure: Pacemaker Implant;  Surgeon: Deboraha Sprang, MD;  Location: [facility];  Service: Cardiovascular;  Laterality: N/A;  . INSERT / REPLACE /  REMOVE PACEMAKER  2017   Dr. Jens Som [facility])  . JOINT REPLACEMENT    . LUMBAR DISC SURGERY  1990's   x 2  . THYROIDECTOMY  2011  . TOTAL KNEE ARTHROPLASTY  07/27/2011   Procedure: TOTAL KNEE ARTHROPLASTY;  Surgeon: Mauri Pole, MD;  Location: [facility];  Service: Orthopedics;  Laterality: Left;  . UPPER ESOPHAGEAL ENDOSCOPIC ULTRASOUND (EUS) N/A 05/14/2016   Procedure: UPPER ESOPHAGEAL ENDOSCOPIC ULTRASOUND (EUS);  Surgeon: Milus Banister, MD;  Location: [facility];  Service: Endoscopy;  Laterality: N/A;  radial only-will be done mod if needed   Social History   Occupational History  . Occupation: retired  Tobacco Use  . Smoking status: Former Smoker    Packs/day: 2.00    Years: 33.00    Pack years: 66.00    Types: Cigarettes    Start date: 1966    Quit date: 05/04/1997    Years since quitting: 23.0  . Smokeless tobacco: Never Used  Vaping Use  . Vaping Use: Never used  Substance and Sexual Activity  . Alcohol use: No  . Drug use: No  . Sexual activity: Not on file

## 2020-05-29 ENCOUNTER — Telehealth: Payer: Self-pay | Admitting: Family Medicine

## 2020-05-29 LAB — D-DIMER, QUANTITATIVE: D-Dimer, Quant: 0.41 mcg/mL FEU (ref <0.50)

## 2020-05-29 NOTE — Telephone Encounter (Signed)
D-Dimer was negative/normal, so blood clot is very unlikely.

## 2020-05-29 NOTE — Telephone Encounter (Signed)
I called and left voice mail advising of these results.

## 2020-05-30 DIAGNOSIS — E113313 Type 2 diabetes mellitus with moderate nonproliferative diabetic retinopathy with macular edema, bilateral: Secondary | ICD-10-CM | POA: Diagnosis not present

## 2020-06-07 ENCOUNTER — Ambulatory Visit: Payer: Medicare HMO | Admitting: Podiatry

## 2020-06-10 ENCOUNTER — Encounter (HOSPITAL_COMMUNITY)
Admission: RE | Admit: 2020-06-10 | Discharge: 2020-06-10 | Disposition: A | Discharge status: Home / Self Care | Payer: Medicare HMO | Source: Ambulatory Visit | Attending: Family Medicine | Admitting: Family Medicine

## 2020-06-10 ENCOUNTER — Telehealth: Payer: Self-pay | Admitting: Family Medicine

## 2020-06-10 ENCOUNTER — Other Ambulatory Visit: Payer: Self-pay | Admitting: Family Medicine

## 2020-06-10 ENCOUNTER — Other Ambulatory Visit: Payer: Self-pay

## 2020-06-10 DIAGNOSIS — M25551 Pain in right hip: Secondary | ICD-10-CM | POA: Insufficient documentation

## 2020-06-10 DIAGNOSIS — S82141D Displaced bicondylar fracture of right tibia, subsequent encounter for closed fracture with routine healing: Secondary | ICD-10-CM

## 2020-06-10 MED ORDER — TECHNETIUM TC 99M MEDRONATE IV KIT
20.0000 | PACK | Freq: Once | INTRAVENOUS | Status: AC | PRN
  Administered 2020-06-10: 20 via INTRAVENOUS

## 2020-06-10 NOTE — Telephone Encounter (Signed)
Lady from Nuclear medicine with [facility] called and wanted to clarify order for this pt. Her phone number is [phone]

## 2020-06-10 NOTE — Telephone Encounter (Signed)
I spoke with [place]: they cannot do a 3-phase bone scan on both the knee and the hip. The order will need to be changed to a whole body scan. Dr. Junius Roads put in a new order for them to do a whole body scan.

## 2020-06-11 ENCOUNTER — Ambulatory Visit: Payer: Medicare HMO | Admitting: Podiatry

## 2020-06-11 ENCOUNTER — Telehealth: Payer: Self-pay | Admitting: Family Medicine

## 2020-06-11 NOTE — Telephone Encounter (Signed)
Bone scan does not show any fractures in the back or pelvis, and the knee fracture appears healed.  Aggravation of arthritis in the hip (SI joint) is likely why she hurts.  If desired, can refer her to Dr. Ernestina Patches for injection or I can do one under ultrasound.  Similarly, can inject her knee if desired.

## 2020-06-11 NOTE — Telephone Encounter (Signed)
I called and advised the patient of her results. Her knee is still giving her quite a bit of pain. Scheduled an appointment for 06/14/20 at 11:00 with Dr. Junius Roads for a knee injection. She would like to discuss the SI joint injection at that time.

## 2020-06-12 DIAGNOSIS — H26493 Other secondary cataract, bilateral: Secondary | ICD-10-CM | POA: Diagnosis not present

## 2020-06-12 DIAGNOSIS — E113213 Type 2 diabetes mellitus with mild nonproliferative diabetic retinopathy with macular edema, bilateral: Secondary | ICD-10-CM | POA: Diagnosis not present

## 2020-06-12 DIAGNOSIS — H35363 Drusen (degenerative) of macula, bilateral: Secondary | ICD-10-CM | POA: Diagnosis not present

## 2020-06-14 ENCOUNTER — Ambulatory Visit: Payer: Medicare HMO | Admitting: Podiatry

## 2020-06-14 ENCOUNTER — Other Ambulatory Visit: Payer: Self-pay

## 2020-06-14 ENCOUNTER — Encounter: Payer: Self-pay | Admitting: Family Medicine

## 2020-06-14 ENCOUNTER — Ambulatory Visit (INDEPENDENT_AMBULATORY_CARE_PROVIDER_SITE_OTHER): Payer: Medicare HMO | Admitting: Family Medicine

## 2020-06-14 DIAGNOSIS — M1711 Unilateral primary osteoarthritis, right knee: Secondary | ICD-10-CM

## 2020-06-14 DIAGNOSIS — M25561 Pain in right knee: Secondary | ICD-10-CM

## 2020-06-14 NOTE — Progress Notes (Signed)
Office Visit Note   Patient: Kayla [name]           Date of Birth: 04/03/1941           MRN: [id number] Visit Date: 06/14/2020 Requested by: [name], MD [place],  [place] [id number] PCP: [name], MD  Subjective: Chief Complaint  Patient presents with  . Right Knee - Pain, Follow-up    Planned cortisone injection    HPI: She is here for follow-up right leg pain.  Bone scan showed changes of osteoarthritis in her right knee and in the SI joint.  She continues to have pain especially in the knee.  She continues to have swelling in her right leg more than the left.  D-dimer was normal.              ROS:   All other systems were reviewed and are negative.  Objective: Vital Signs: There were no vitals taken for this visit.  Physical Exam:  General:  Alert and oriented, in no acute distress. Pulm:  Breathing unlabored. Psy:  Normal mood, congruent affect  Right leg: She has 1+ effusion in the right knee.  She has pain with manipulation of the patellofemoral joint.  Pain with active extension of her knee.  1-2+ edema to the knee.  1+ on the left.  Imaging: No results found.  Assessment & Plan: 1.  Right knee pain with underlying DJD -Patient is surprised that arthritis is hurting this badly.  She did not have this much pain prior to her left knee replacement. -We elected to inject the knee with cortisone.  She will call me on Monday to let me know how she is doing.  If she does not get any relief from this, we may need to consider additional testing such as Doppler study of the leg to rule out DVT.      Procedures: Right knee injection: After sterile prep with Betadine, injected 3 cc 0.25% bupivacaine and 6 mg betamethasone from superolateral approach, a flash of clear yellow synovial fluid was obtained prior to injection.  She felt improvement during the immediate anesthetic phase.       PMFS History: Patient Active Problem List    Diagnosis Date Noted  . Polymyalgia rheumatica ([place]) 04/15/2020  . Temporal arteritis ([place]) 04/15/2020  . Pacemaker - MDT 01/24/2020  . Orthostatic lightheadedness 01/24/2020  . Chronic osteomyelitis involving left ankle and foot ([place])   . Paroxysmal A-fib ([place]) 10/30/2019  . Paroxysmal atrial fibrillation ([place]) 10/25/2019  . Secondary hypercoagulable state ([place]) 10/25/2019  . Numbness and tingling of right leg 10/19/2019  . Lumbar radiculopathy 10/19/2019  . Acute exacerbation of CHF (congestive heart failure) ([place]) 08/13/2019  . COPD with acute exacerbation ([place]) 08/13/2019  . Acute respiratory failure ([place]) 08/13/2019  . Chest pain 08/13/2019  . Leukocytosis 08/13/2019  . Leg swelling 12/09/2018  . Urinary frequency 12/09/2018  . Pain in joint of right hip 01/21/2018  . Myofascial pain syndrome, cervical 01/06/2018  . Occipital neuralgia of right side 01/06/2018  . RUQ abdominal pain   . Gastritis and gastroduodenitis   . Bile duct abnormality   . Cholecystitis 04/30/2016  . Gall bladder disease 04/28/2016  . Sinus node dysfunction ([place]) 11/08/2015  . Ejection fraction   . Spinal stenosis of cervical region 11/07/2014  . Preoperative clearance 10/09/2014  . Carotid artery disease ([place])   . S/P left TKA 07/27/2011  . Dizzy spells 05/04/2011  .  Palpitations 01/21/2011  . RHINITIS 07/21/2010  . Hyperlipidemia 11/24/2008  . OBESITY 11/24/2008  . Coronary atherosclerosis 11/24/2008  . VENTRICULAR HYPERTROPHY, LEFT 11/24/2008  . HYPOTHYROIDISM, POSTSURGICAL 06/04/2008  . Headache 06/04/2008  . DM (diabetes mellitus) ([place]) 03/23/2007  . G E R D 03/23/2007  . DIVERTICULOSIS OF COLON 03/23/2007  . GENERALIZED OSTEOARTHROSIS UNSPECIFIED SITE 03/23/2007  . OBSTRUCTIVE SLEEP APNEA 02/16/2007  . Essential hypertension 02/16/2007  . COPD 02/16/2007   Past Medical History:  Diagnosis Date  . A-fib ([place])   . Allergy   . Anxiety   . Arthritis   . CAD (coronary artery disease)     Mild per remote cath in 2006, /    Nuclear, January, 2013, no significant abnormality,  . Carotid artery disease ([place])    Doppler, January, 2013, 0 54% RIC A., 65-03% LICA, stable  . Cataract   . CHF (congestive heart failure) ([place])   . Complication of anesthesia    wakes up "mean"  . COPD 02/16/2007   Intolerant of Spiriva, tudorza Intolerant of Trelegy   . COPD (chronic obstructive pulmonary disease) ([place])   . Coronary atherosclerosis 11/24/2008   Catheterization 2006, nonobstructive coronary disease   //   nuclear 2013, low risk    . Diabetes mellitus, type 2 ([place])   . Diverticula of colon   . DIVERTICULOSIS OF COLON 03/23/2007   Qualifier: Diagnosis of  By: Halford Chessman MD, [place]    . Dizziness    occasional  . Dizzy spells 05/04/2011  . DM (diabetes mellitus) ([place]) 03/23/2007   Qualifier: Diagnosis of  By: Halford Chessman MD, Vineet    . Dyspnea    On exertion  . Ejection fraction   . Essential hypertension 02/16/2007   Qualifier: Diagnosis of  By: Rosana Hoes CMA, Tammy    . G E R D 03/23/2007   Qualifier: Diagnosis of  By: Halford Chessman MD, Vineet    . Gall bladder disease 04/28/2016  . Gastritis and gastroduodenitis   . GERD (gastroesophageal reflux disease)   . Goiter    hx of  . Headache(784.0)    migraine  . Hyperlipidemia   . Hypertension   . Hypothyroidism   . HYPOTHYROIDISM, POSTSURGICAL 06/04/2008   Qualifier: Diagnosis of  By: Loanne Drilling MD, Jacelyn Pi   . Left ventricular hypertrophy   . Leg edema    occasional swelling  . Leg swelling 12/09/2018  . Myofascial pain syndrome, cervical 01/06/2018  . Obesity   . OBESITY 11/24/2008   Qualifier: Diagnosis of  By: Sidney Ace    . OBSTRUCTIVE SLEEP APNEA 02/16/2007   Auto CPAP 09/03/13 to 10/02/13 >> used on 30 of 30 nights with average 6 hrs and 3 min.  Average AHI is 3.1 with median CPAP 5 cm H2O and 95 th percentile CPAP 6 cm H2O.    . Occipital neuralgia of right side 01/06/2018  . OSA (obstructive sleep apnea)    cpap setting of 12  . Pain in  joint of right hip 01/21/2018  . Palpitations 01/21/2011   48 hour Holter, 2015, scattered PACs and PVCs.   . Pneumonia   . PONV (postoperative nausea and vomiting)    severe after every surgery  . Pulmonary hypertension, secondary   . RHINITIS 07/21/2010       . RUQ abdominal pain   . S/P left TKA 07/27/2011  . Sinus node dysfunction ([place]) 11/08/2015  . Sleep apnea   . Spinal stenosis of cervical region 11/07/2014  . Urinary frequency 12/09/2018  .  UTI (urinary tract infection) 08/15/2019   per pt report  . VENTRICULAR HYPERTROPHY, LEFT 11/24/2008   Qualifier: Diagnosis of  By: Sidney Ace      Family History  Problem Relation Age of Onset  . Heart Problems Mother   . Heart defect Mother        valve problems  . Heart Problems Father   . Heart disease Brother   . Lung cancer Daughter        non small cell   . Diabetes Maternal Aunt   . Diabetes Maternal Uncle   . Throat cancer Brother   . Colon cancer Neg Hx   . Colon polyps Neg Hx   . Esophageal cancer Neg Hx   . Rectal cancer Neg Hx   . Stomach cancer Neg Hx     Past Surgical History:  Procedure Laterality Date  . ABDOMINAL HYSTERECTOMY     with appendectomy and colon polypectomy  . AMPUTATION TOE Left 12/15/2019   Procedure: AMPUTATION  LEFT GREAT TOE;  Surgeon: Evelina Bucy, DPM;  [facility]: [facility];  Service: Podiatry;  Laterality: Left;  . ANTERIOR CERVICAL DECOMP/DISCECTOMY FUSION N/A 11/07/2014   Procedure: Anterior Cervical diskectomy and fusion Cervical four-five, Cervical five-six, Cervical six-seven;  Surgeon: Kary Kos, MD;  Location: [facility];  Service: Neurosurgery;  Laterality: N/A;  . APPENDECTOMY    . ARTERY BIOPSY Right 02/16/2017   Procedure: BIOPSY TEMPORAL ARTERY;  Surgeon: Katha Cabal, MD;  Location: [facility];  Service: Vascular;  Laterality: Right;  . ARTERY BIOPSY Left 04/16/2020   Procedure: BIOPSY TEMPORAL ARTERY;  Surgeon: Katha Cabal, MD;  Location: [facility] ORS;  Service: Vascular;   Laterality: Left;  LEFT TEMPLE  . BACK SURGERY    . CARDIAC CATHETERIZATION    . CATARACT EXTRACTION W/ INTRAOCULAR LENS  IMPLANT, BILATERAL Bilateral   . CHOLECYSTECTOMY N/A 04/29/2016   Procedure: LAPAROSCOPIC CHOLECYSTECTOMY WITH INTRAOPERATIVE CHOLANGIOGRAM;  Surgeon: Alphonsa Overall, MD;  [facility];  Service: General;  Laterality: N/A;  . EP IMPLANTABLE DEVICE N/A 11/08/2015   MRI COMPATABLE -- Procedure: Pacemaker Implant;  Surgeon: Deboraha Sprang, MD;  Location: [facility];  Service: Cardiovascular;  Laterality: N/A;  . INSERT / REPLACE / REMOVE PACEMAKER  2017   Dr. Jens Som [facility])  . JOINT REPLACEMENT    . LUMBAR DISC SURGERY  1990's   x 2  . THYROIDECTOMY  2011  . TOTAL KNEE ARTHROPLASTY  07/27/2011   Procedure: TOTAL KNEE ARTHROPLASTY;  Surgeon: Mauri Pole, MD;  [facility];  Service: Orthopedics;  Laterality: Left;  . UPPER ESOPHAGEAL ENDOSCOPIC ULTRASOUND (EUS) N/A 05/14/2016   Procedure: UPPER ESOPHAGEAL ENDOSCOPIC ULTRASOUND (EUS);  Surgeon: Milus Banister, MD;  Location: [facility];  Service: Endoscopy;  Laterality: N/A;  radial only-will be done mod if needed   Social History   Occupational History  . Occupation: retired  Tobacco Use  . Smoking status: Former Smoker    Packs/day: 2.00    Years: 33.00    Pack years: 66.00    Types: Cigarettes    Start date: 1966    Quit date: 05/04/1997    Years since quitting: 23.1  . Smokeless tobacco: Never Used  Vaping Use  . Vaping Use: Never used  Substance and Sexual Activity  . Alcohol use: No  . Drug use: No  . Sexual activity: Not on file

## 2020-06-17 ENCOUNTER — Telehealth: Payer: Self-pay | Admitting: Pulmonary Disease

## 2020-06-17 MED ORDER — BREZTRI AEROSPHERE 160-9-4.8 MCG/ACT IN AERO: 2.0000 | INHALATION_SPRAY | Freq: Two times a day (BID) | RESPIRATORY_TRACT | 2 refills | Status: DC

## 2020-06-17 NOTE — Telephone Encounter (Signed)
Patient was just seen in January with Dr. Halford Chessman. States can send refills if patient asks after using sample of Breztri so refills sent. Patient also asking about albuterol and this was sent in 90 day supply with 3 refills so no need for these to be refilled yet.  Nothing further needed at this time.

## 2020-06-19 DIAGNOSIS — E113213 Type 2 diabetes mellitus with mild nonproliferative diabetic retinopathy with macular edema, bilateral: Secondary | ICD-10-CM | POA: Diagnosis not present

## 2020-06-20 ENCOUNTER — Ambulatory Visit (INDEPENDENT_AMBULATORY_CARE_PROVIDER_SITE_OTHER): Payer: Medicare HMO

## 2020-06-20 DIAGNOSIS — I495 Sick sinus syndrome: Secondary | ICD-10-CM

## 2020-06-21 ENCOUNTER — Other Ambulatory Visit: Payer: Self-pay

## 2020-06-21 ENCOUNTER — Ambulatory Visit (INDEPENDENT_AMBULATORY_CARE_PROVIDER_SITE_OTHER): Payer: Medicare HMO | Admitting: Podiatry

## 2020-06-21 DIAGNOSIS — B351 Tinea unguium: Secondary | ICD-10-CM

## 2020-06-21 DIAGNOSIS — E1169 Type 2 diabetes mellitus with other specified complication: Secondary | ICD-10-CM

## 2020-06-21 DIAGNOSIS — E1142 Type 2 diabetes mellitus with diabetic polyneuropathy: Secondary | ICD-10-CM

## 2020-06-21 LAB — CUP PACEART REMOTE DEVICE CHECK
Battery Remaining Longevity: 66 mo
Battery Voltage: 3 V
Brady Statistic AP VP Percent: 1.43 %
Brady Statistic AP VS Percent: 79.87 %
Brady Statistic AS VP Percent: 3.09 %
Brady Statistic AS VS Percent: 15.61 %
Brady Statistic RA Percent Paced: 74.69 %
Brady Statistic RV Percent Paced: 4.98 %
Date Time Interrogation Session: 20220217165911
Implantable Lead Implant Date: 20170707
Implantable Lead Implant Date: 20170707
Implantable Lead Location: 753859
Implantable Lead Location: 753860
Implantable Lead Model: 3830
Implantable Lead Model: 5076
Implantable Pulse Generator Implant Date: 20170707
Lead Channel Impedance Value: 304 Ohm
Lead Channel Impedance Value: 380 Ohm
Lead Channel Impedance Value: 380 Ohm
Lead Channel Impedance Value: 418 Ohm
Lead Channel Pacing Threshold Amplitude: 0.875 V
Lead Channel Pacing Threshold Amplitude: 1.875 V
Lead Channel Pacing Threshold Pulse Width: 0.4 ms
Lead Channel Pacing Threshold Pulse Width: 0.4 ms
Lead Channel Sensing Intrinsic Amplitude: 5.125 mV
Lead Channel Sensing Intrinsic Amplitude: 5.125 mV
Lead Channel Sensing Intrinsic Amplitude: 7.375 mV
Lead Channel Sensing Intrinsic Amplitude: 7.375 mV
Lead Channel Setting Pacing Amplitude: 1.75 V
Lead Channel Setting Pacing Amplitude: 2.5 V
Lead Channel Setting Pacing Pulse Width: 1 ms
Lead Channel Setting Sensing Sensitivity: 2.8 mV

## 2020-06-21 NOTE — Progress Notes (Signed)
  Subjective:  Patient ID: Kayla [name], female    DOB: 1941/04/06,  MRN: [id number]  Chief Complaint  Patient presents with  . Nail Problem    Nail trim bilateral   . Callouses    Callous trim bilateral    DOS: 12/15/19 Procedure: Left great toe partial amputation   80 y.o. female presents with the above complaint. History confirmed with patient. Pleased with results of left great toe, remains healed without issues.  Objective:  Physical Exam: no tenderness at the surgical site. Incision: well healed without ulceration Onychomycosis of remaining digits. Good warmth to foot. Protective sensation absent.   Assessment:   1. Onychomycosis of multiple toenails with type 2 diabetes mellitus and peripheral neuropathy ([place])     Plan:  Patient was evaluated and treated and all questions answered.  S/p Left partial hallux amputation -Well healed  Diabetes with DPN, Onychomycosis -Nails debrided x9   Procedure: Nail Debridement Type of Debridement: manual, sharp debridement. Instrumentation: Nail nipper, rotary burr. Number of Nails: 9     No follow-ups on file.

## 2020-06-25 ENCOUNTER — Telehealth: Payer: Self-pay | Admitting: Family Medicine

## 2020-06-25 ENCOUNTER — Ambulatory Visit (INDEPENDENT_AMBULATORY_CARE_PROVIDER_SITE_OTHER): Payer: Medicare HMO | Admitting: Family Medicine

## 2020-06-25 ENCOUNTER — Other Ambulatory Visit: Payer: Self-pay

## 2020-06-25 DIAGNOSIS — M1711 Unilateral primary osteoarthritis, right knee: Secondary | ICD-10-CM

## 2020-06-25 DIAGNOSIS — M79604 Pain in right leg: Secondary | ICD-10-CM | POA: Diagnosis not present

## 2020-06-25 NOTE — Progress Notes (Signed)
Office Visit Note   Patient: Kayla [name]           Date of Birth: Dec 28, 1940           MRN: [id number] Visit Date: 06/25/2020 Requested by: [name], MD [place],  [place] [id number] PCP: [name], MD  Subjective: Chief Complaint  Patient presents with  . Right Knee - Pain    Pain was not relieved at all with the cortisone injection. Are there any other options to relieve this pain?    HPI: She is here with persistent right knee and leg pain.  Steroid injection in the knee did not help even temporarily.  Pain keeps her awake at night.  She feels pain radiating all the way down into her first through third toes.  She has numbness and tingling as well.  She does have chronic back problems and is on gabapentin and hydrocodone for that.  Typically she does not have sciatica.  She also still has swelling in her leg.  D-dimer was negative.  She is status post left knee replacement per Dr. Alvan Dame.  She states that when it started hurting prior to replacement, nothing worked to get it feeling better.                ROS:   All other systems were reviewed and are negative.  Objective: Vital Signs: There were no vitals taken for this visit.  Physical Exam:  General:  Alert and oriented, in no acute distress. Pulm:  Breathing unlabored. Psy:  Normal mood, congruent affect. Skin: No erythema Right knee: 1+ effusion with no warmth.  Right knee is very tender on the medial joint line.  She has 2+ pitting edema in the lower leg to mid shin.  Negative Homans' sign.  Lower extremity pharynx remains normal.  Imaging: No results found.  Assessment & Plan: 1.  Right knee and leg pain, etiology uncertain.  She does have significant DJD.  The tibial plateau fracture could potentially not be completely healed.  She could also be having sciatica pain contributing to her symptoms. -We discussed options and elected to try to get insurance approval for gel injection.   If this does not help, then consider a one-time epidural steroid injection into the lumbar spine.  She has a pacemaker and MRI scan would have to be coordinated with a cardiologist.      Procedures: No procedures performed        PMFS History: Patient Active Problem List   Diagnosis Date Noted  . Polymyalgia rheumatica ([place]) 04/15/2020  . Temporal arteritis ([place]) 04/15/2020  . Pacemaker - MDT 01/24/2020  . Orthostatic lightheadedness 01/24/2020  . Chronic osteomyelitis involving left ankle and foot ([place])   . Paroxysmal A-fib ([place]) 10/30/2019  . Paroxysmal atrial fibrillation ([place]) 10/25/2019  . Secondary hypercoagulable state ([place]) 10/25/2019  . Numbness and tingling of right leg 10/19/2019  . Lumbar radiculopathy 10/19/2019  . Acute exacerbation of CHF (congestive heart failure) ([place]) 08/13/2019  . COPD with acute exacerbation ([place]) 08/13/2019  . Acute respiratory failure ([place]) 08/13/2019  . Chest pain 08/13/2019  . Leukocytosis 08/13/2019  . Leg swelling 12/09/2018  . Urinary frequency 12/09/2018  . Pain in joint of right hip 01/21/2018  . Myofascial pain syndrome, cervical 01/06/2018  . Occipital neuralgia of right side 01/06/2018  . RUQ abdominal pain   . Gastritis and gastroduodenitis   . Bile duct abnormality   . Cholecystitis 04/30/2016  .  Gall bladder disease 04/28/2016  . Sinus node dysfunction ([place]) 11/08/2015  . Ejection fraction   . Spinal stenosis of cervical region 11/07/2014  . Preoperative clearance 10/09/2014  . Carotid artery disease ([place])   . S/P left TKA 07/27/2011  . Dizzy spells 05/04/2011  . Palpitations 01/21/2011  . RHINITIS 07/21/2010  . Hyperlipidemia 11/24/2008  . OBESITY 11/24/2008  . Coronary atherosclerosis 11/24/2008  . VENTRICULAR HYPERTROPHY, LEFT 11/24/2008  . HYPOTHYROIDISM, POSTSURGICAL 06/04/2008  . Headache 06/04/2008  . DM (diabetes mellitus) ([place]) 03/23/2007  . G E R D 03/23/2007  . DIVERTICULOSIS OF COLON 03/23/2007   . GENERALIZED OSTEOARTHROSIS UNSPECIFIED SITE 03/23/2007  . OBSTRUCTIVE SLEEP APNEA 02/16/2007  . Essential hypertension 02/16/2007  . COPD 02/16/2007   Past Medical History:  Diagnosis Date  . A-fib ([place])   . Allergy   . Anxiety   . Arthritis   . CAD (coronary artery disease)    Mild per remote cath in 2006, /    Nuclear, January, 2013, no significant abnormality,  . Carotid artery disease ([place])    Doppler, January, 2013, 0 95% RIC A., 09-32% LICA, stable  . Cataract   . CHF (congestive heart failure) ([place])   . Complication of anesthesia    wakes up "mean"  . COPD 02/16/2007   Intolerant of Spiriva, tudorza Intolerant of Trelegy   . COPD (chronic obstructive pulmonary disease) ([place])   . Coronary atherosclerosis 11/24/2008   Catheterization 2006, nonobstructive coronary disease   //   nuclear 2013, low risk    . Diabetes mellitus, type 2 ([place])   . Diverticula of colon   . DIVERTICULOSIS OF COLON 03/23/2007   Qualifier: Diagnosis of  By: Halford Chessman MD, [place]    . Dizziness    occasional  . Dizzy spells 05/04/2011  . DM (diabetes mellitus) ([place]) 03/23/2007   Qualifier: Diagnosis of  By: Halford Chessman MD, [place]    . Dyspnea    On exertion  . Ejection fraction   . Essential hypertension 02/16/2007   Qualifier: Diagnosis of  By: Rosana Hoes CMA, Tammy    . G E R D 03/23/2007   Qualifier: Diagnosis of  By: Halford Chessman MD, [place]    . Gall bladder disease 04/28/2016  . Gastritis and gastroduodenitis   . GERD (gastroesophageal reflux disease)   . Goiter    hx of  . Headache(784.0)    migraine  . Hyperlipidemia   . Hypertension   . Hypothyroidism   . HYPOTHYROIDISM, POSTSURGICAL 06/04/2008   Qualifier: Diagnosis of  By: Loanne Drilling MD, Jacelyn Pi   . Left ventricular hypertrophy   . Leg edema    occasional swelling  . Leg swelling 12/09/2018  . Myofascial pain syndrome, cervical 01/06/2018  . Obesity   . OBESITY 11/24/2008   Qualifier: Diagnosis of  By: Sidney Ace    . OBSTRUCTIVE SLEEP APNEA  02/16/2007   Auto CPAP 09/03/13 to 10/02/13 >> used on 30 of 30 nights with average 6 hrs and 3 min.  Average AHI is 3.1 with median CPAP 5 cm H2O and 95 th percentile CPAP 6 cm H2O.    . Occipital neuralgia of right side 01/06/2018  . OSA (obstructive sleep apnea)    cpap setting of 12  . Pain in joint of right hip 01/21/2018  . Palpitations 01/21/2011   48 hour Holter, 2015, scattered PACs and PVCs.   . Pneumonia   . PONV (postoperative nausea and vomiting)    severe after every surgery  . Pulmonary  hypertension, secondary   . RHINITIS 07/21/2010       . RUQ abdominal pain   . S/P left TKA 07/27/2011  . Sinus node dysfunction ([place]) 11/08/2015  . Sleep apnea   . Spinal stenosis of cervical region 11/07/2014  . Urinary frequency 12/09/2018  . UTI (urinary tract infection) 08/15/2019   per pt report  . VENTRICULAR HYPERTROPHY, LEFT 11/24/2008   Qualifier: Diagnosis of  By: Sidney Ace      Family History  Problem Relation Age of Onset  . Heart Problems Mother   . Heart defect Mother        valve problems  . Heart Problems Father   . Heart disease Brother   . Lung cancer Daughter        non small cell   . Diabetes Maternal Aunt   . Diabetes Maternal Uncle   . Throat cancer Brother   . Colon cancer Neg Hx   . Colon polyps Neg Hx   . Esophageal cancer Neg Hx   . Rectal cancer Neg Hx   . Stomach cancer Neg Hx     Past Surgical History:  Procedure Laterality Date  . ABDOMINAL HYSTERECTOMY     with appendectomy and colon polypectomy  . AMPUTATION TOE Left 12/15/2019   Procedure: AMPUTATION  LEFT GREAT TOE;  Surgeon: Evelina Bucy, DPM;  [facility];  Service: Podiatry;  Laterality: Left;  . ANTERIOR CERVICAL DECOMP/DISCECTOMY FUSION N/A 11/07/2014   Procedure: Anterior Cervical diskectomy and fusion Cervical four-five, Cervical five-six, Cervical six-seven;  Surgeon: Kary Kos, MD;  Location: [facility];  Service: Neurosurgery;  Laterality: N/A;  . APPENDECTOMY    . ARTERY  BIOPSY Right 02/16/2017   Procedure: BIOPSY TEMPORAL ARTERY;  Surgeon: Katha Cabal, MD;  Location: [facility];  Service: Vascular;  Laterality: Right;  . ARTERY BIOPSY Left 04/16/2020   Procedure: BIOPSY TEMPORAL ARTERY;  Surgeon: Katha Cabal, MD;  Location: [facility];  Service: Vascular;  Laterality: Left;  LEFT TEMPLE  . BACK SURGERY    . CARDIAC CATHETERIZATION    . CATARACT EXTRACTION W/ INTRAOCULAR LENS  IMPLANT, BILATERAL Bilateral   . CHOLECYSTECTOMY N/A 04/29/2016   Procedure: LAPAROSCOPIC CHOLECYSTECTOMY WITH INTRAOPERATIVE CHOLANGIOGRAM;  Surgeon: Alphonsa Overall, MD;  [facility];  Service: General;  Laterality: N/A;  . EP IMPLANTABLE DEVICE N/A 11/08/2015   MRI COMPATABLE -- Procedure: Pacemaker Implant;  Surgeon: Deboraha Sprang, MD;  Location: [facility];  Service: Cardiovascular;  Laterality: N/A;  . INSERT / REPLACE / REMOVE PACEMAKER  2017   Dr. Jens Som [facility])  . JOINT REPLACEMENT    . LUMBAR DISC SURGERY  1990's   x 2  . THYROIDECTOMY  2011  . TOTAL KNEE ARTHROPLASTY  07/27/2011   Procedure: TOTAL KNEE ARTHROPLASTY;  Surgeon: Mauri Pole, MD;  [facility];  Service: Orthopedics;  Laterality: Left;  . UPPER ESOPHAGEAL ENDOSCOPIC ULTRASOUND (EUS) N/A 05/14/2016   Procedure: UPPER ESOPHAGEAL ENDOSCOPIC ULTRASOUND (EUS);  Surgeon: Milus Banister, MD;  Location: [facility];  Service: Endoscopy;  Laterality: N/A;  radial only-will be done mod if needed   Social History   Occupational History  . Occupation: retired  Tobacco Use  . Smoking status: Former Smoker    Packs/day: 2.00    Years: 33.00    Pack years: 66.00    Types: Cigarettes    Start date: 1966    Quit date: 05/04/1997    Years since quitting: 23.1  .  Smokeless tobacco: Never Used  Vaping Use  . Vaping Use: Never used  Substance and Sexual Activity  . Alcohol use: No  . Drug use: No  . Sexual activity: Not on file

## 2020-06-25 NOTE — Telephone Encounter (Signed)
Requesting approval for right knee OA gel injections.

## 2020-06-26 NOTE — Telephone Encounter (Signed)
Noted  

## 2020-06-26 NOTE — Progress Notes (Signed)
Remote pacemaker transmission.   

## 2020-07-02 DIAGNOSIS — Z6841 Body Mass Index (BMI) 40.0 and over, adult: Secondary | ICD-10-CM | POA: Diagnosis not present

## 2020-07-02 DIAGNOSIS — M79604 Pain in right leg: Secondary | ICD-10-CM | POA: Diagnosis not present

## 2020-07-02 DIAGNOSIS — R600 Localized edema: Secondary | ICD-10-CM | POA: Diagnosis not present

## 2020-07-04 ENCOUNTER — Telehealth: Payer: Self-pay

## 2020-07-04 NOTE — Telephone Encounter (Signed)
Submitted VOB for Monovisc, right knee. Pending BV 

## 2020-07-09 ENCOUNTER — Other Ambulatory Visit: Payer: Self-pay

## 2020-07-09 ENCOUNTER — Encounter: Payer: Self-pay | Admitting: Internal Medicine

## 2020-07-09 ENCOUNTER — Ambulatory Visit: Payer: Medicare HMO | Admitting: Internal Medicine

## 2020-07-09 VITALS — BP 120/68 | HR 80 | Ht 66.0 in | Wt 241.0 lb

## 2020-07-09 DIAGNOSIS — I480 Paroxysmal atrial fibrillation: Secondary | ICD-10-CM

## 2020-07-09 DIAGNOSIS — Z950 Presence of cardiac pacemaker: Secondary | ICD-10-CM | POA: Diagnosis not present

## 2020-07-09 DIAGNOSIS — I495 Sick sinus syndrome: Secondary | ICD-10-CM | POA: Diagnosis not present

## 2020-07-09 DIAGNOSIS — G4733 Obstructive sleep apnea (adult) (pediatric): Secondary | ICD-10-CM | POA: Diagnosis not present

## 2020-07-09 DIAGNOSIS — Z79899 Other long term (current) drug therapy: Secondary | ICD-10-CM

## 2020-07-09 MED ORDER — TORSEMIDE 20 MG PO TABS: 60.0000 mg | ORAL_TABLET | ORAL | 3 refills | Status: DC

## 2020-07-09 MED ORDER — AMIODARONE HCL 200 MG PO TABS: ORAL_TABLET | ORAL | 0 refills | Status: DC

## 2020-07-09 MED ORDER — AMIODARONE HCL 200 MG PO TABS: 200.0000 mg | ORAL_TABLET | Freq: Every day | ORAL | 3 refills | Status: DC

## 2020-07-09 NOTE — Patient Instructions (Addendum)
Medication Instructions:  Your physician has recommended you make the following change in your medication:   ** Begin Amiodarone 400mg  - Take 2- 200mg  tablets by mouth twice daily x 14 days then decrease to 400mg  - 2 200mg  tablets by mouth once daily x 2 weeks.  ** Begin Amiodarone 200mg  - 1 tablet by mouth daily   *If you need a refill on your cardiac medications before your next appointment, please call your pharmacy*   Lab Work: CMET and TSH in 6 weeks  If you have labs (blood work) drawn today and your tests are completely normal, you will receive your results only by: Marland Kitchen MyChart Message (if you have MyChart) OR . A paper copy in the mail If you have any lab test that is abnormal or we need to change your treatment, we will call you to review the results.   Testing/Procedures: None ordered.    Follow-Up: At [facility], you and your health needs are our priority.  As part of our continuing mission to provide you with exceptional heart care, we have created designated Provider Care Teams.  These Care Teams include your primary Cardiologist (physician) and Advanced Practice Providers (APPs -  Physician Assistants and Nurse Practitioners) who all work together to provide you with the care you need, when you need it.  We recommend signing up for the patient portal called "MyChart".  Sign up information is provided on this After Visit Summary.  MyChart is used to connect with patients for Virtual Visits (Telemedicine).  Patients are able to view lab/test results, encounter notes, upcoming appointments, etc.  Non-urgent messages can be sent to your provider as well.   To learn more about what you can do with MyChart, go to [email or web address].    Your next appointment:   12 week(s)  The format for your next appointment:   In Person  Provider:    [name], PA-C

## 2020-07-09 NOTE — Progress Notes (Signed)
Patient Care Team: Hamrick, Lorin Mercy, MD as PCP - General (Family Medicine) Jerline Pain, MD as PCP - Cardiology (Cardiology) Milus Banister, MD as Consulting Physician (Gastroenterology)   HPI  Kayla [name] is a 80 y.o. female Seen in follow-up for pacemaker implanted (7/17)  for sinus bradycardia and atrial fibrillation resistant to dofetilide and assoc with RVR   in the past have considered AV junction ablation  Nonobstructive coronary artery disease by catheterization 2006 with a low risk Myoview 2013.      Dofetilide initiated 7/21 but discontinued because of lack of effectiveness.  Has had discussions regarding amiodarone and AV ablation as well as the possibility of PVI... Better in sinus. On apixaban.  Continues with recurrent symptomatic atrial fibrillation with a modestly rapid ventricular response, the latter improved following up titration of her metoprolol.  When seen 12/21 complaints of possible girdle aching.  CRP and ESR were   Elevated  Started with prednisone with significant improvement, underwent Bx that she reports was  neg  Episodes of afib are assoc with palps and profound diaphoresis and weakness   Unaware of her anemia issue (See Below) has been started on iron not withstanding normal iron studies 4/21      DATE TEST EF   12-Aug-2022 Echo   60-65 %   8/19 CTA    % LAD,CX,RCA diffuse FFR ( distal OM2) 0.78  4/21 Echo 60-65% LVH (20m), RAE LA normal     Date Cr K Mg Hgb  7/19 1.24 4.0    8/21 1.91<<1.40 5.0 2.0 9.1  10/21 1.32 4.5     4/21 Ferritin  28  B12 659     Past Medical History:  Diagnosis Date  . A-fib ([place]   . Allergy   . Anxiety   . Arthritis   . CAD (coronary artery disease)    Mild per remote cath in 2006, /    Nuclear, January, 2013, no significant abnormality,  . Carotid artery disease ([place]    Doppler, January, 2013, 0 325%RIC A., 436-64%LICA, stable  . Cataract   . CHF (congestive heart failure) ([place]   .  Complication of anesthesia    wakes up "mean"  . COPD 02/16/2007   Intolerant of Spiriva, tudorza Intolerant of Trelegy   . COPD (chronic obstructive pulmonary disease) ([place]   . Coronary atherosclerosis 11/24/2008   Catheterization 2006, nonobstructive coronary disease   //   nuclear 2013, low risk    . Diabetes mellitus, type 2 ([place]   . Diverticula of colon   . DIVERTICULOSIS OF COLON 03/23/2007   Qualifier: Diagnosis of  By: SHalford ChessmanMD, Vineet    . Dizziness    occasional  . Dizzy spells 05/04/2011  . DM (diabetes mellitus) ([place] 03/23/2007   Qualifier: Diagnosis of  By: SHalford ChessmanMD, Vineet    . Dyspnea    On exertion  . Ejection fraction   . Essential hypertension 02/16/2007   Qualifier: Diagnosis of  By: DRosana HoesCMA, Tammy    . G E R D 03/23/2007   Qualifier: Diagnosis of  By: SHalford ChessmanMD, Vineet    . Gall bladder disease 04/28/2016  . Gastritis and gastroduodenitis   . GERD (gastroesophageal reflux disease)   . Goiter    hx of  . Headache(784.0)    migraine  . Hyperlipidemia   . Hypertension   . Hypothyroidism   . HYPOTHYROIDISM, POSTSURGICAL 06/04/2008   Qualifier: Diagnosis of  By: Loanne Drilling MD, Jacelyn Pi Left ventricular hypertrophy   . Leg edema    occasional swelling  . Leg swelling 12/09/2018  . Myofascial pain syndrome, cervical 01/06/2018  . Obesity   . OBESITY 11/24/2008   Qualifier: Diagnosis of  By: Sidney Ace    . OBSTRUCTIVE SLEEP APNEA 02/16/2007   Auto CPAP 09/03/13 to 10/02/13 >> used on 30 of 30 nights with average 6 hrs and 3 min.  Average AHI is 3.1 with median CPAP 5 cm H2O and 95 th percentile CPAP 6 cm H2O.    . Occipital neuralgia of right side 01/06/2018  . OSA (obstructive sleep apnea)    cpap setting of 12  . Pain in joint of right hip 01/21/2018  . Palpitations 01/21/2011   48 hour Holter, 2015, scattered PACs and PVCs.   . Pneumonia   . PONV (postoperative nausea and vomiting)    severe after every surgery  . Pulmonary hypertension, secondary   .  RHINITIS 07/21/2010       . RUQ abdominal pain   . S/P left TKA 07/27/2011  . Sinus node dysfunction ([place]) 11/08/2015  . Sleep apnea   . Spinal stenosis of cervical region 11/07/2014  . Urinary frequency 12/09/2018  . UTI (urinary tract infection) 08/15/2019   per pt report  . VENTRICULAR HYPERTROPHY, LEFT 11/24/2008   Qualifier: Diagnosis of  By: Sidney Ace      Past Surgical History:  Procedure Laterality Date  . ABDOMINAL HYSTERECTOMY     with appendectomy and colon polypectomy  . AMPUTATION TOE Left 12/15/2019   Procedure: AMPUTATION  LEFT GREAT TOE;  Surgeon: Evelina Bucy, DPM;  [facility];  Service: Podiatry;  Laterality: Left;  . ANTERIOR CERVICAL DECOMP/DISCECTOMY FUSION N/A 11/07/2014   Procedure: Anterior Cervical diskectomy and fusion Cervical four-five, Cervical five-six, Cervical six-seven;  Surgeon: Kary Kos, MD;  Location: [facility];  Service: Neurosurgery;  Laterality: N/A;  . APPENDECTOMY    . ARTERY BIOPSY Right 02/16/2017   Procedure: BIOPSY TEMPORAL ARTERY;  Surgeon: Katha Cabal, MD;  Location: [facility] ORS;  Service: Vascular;  Laterality: Right;  . ARTERY BIOPSY Left 04/16/2020   Procedure: BIOPSY TEMPORAL ARTERY;  Surgeon: Katha Cabal, MD;  Location: [facility] ORS;  Service: Vascular;  Laterality: Left;  LEFT TEMPLE  . BACK SURGERY    . CARDIAC CATHETERIZATION    . CATARACT EXTRACTION W/ INTRAOCULAR LENS  IMPLANT, BILATERAL Bilateral   . CHOLECYSTECTOMY N/A 04/29/2016   Procedure: LAPAROSCOPIC CHOLECYSTECTOMY WITH INTRAOPERATIVE CHOLANGIOGRAM;  Surgeon: Alphonsa Overall, MD;  [facility];  Service: General;  Laterality: N/A;  . EP IMPLANTABLE DEVICE N/A 11/08/2015   MRI COMPATABLE -- Procedure: Pacemaker Implant;  Surgeon: Deboraha Sprang, MD;  Location: [facility];  Service: Cardiovascular;  Laterality: N/A;  . INSERT / REPLACE / REMOVE PACEMAKER  2017   Dr. Jens Som [facility])  . JOINT REPLACEMENT    . LUMBAR DISC SURGERY  1990's   x 2   . THYROIDECTOMY  2011  . TOTAL KNEE ARTHROPLASTY  07/27/2011   Procedure: TOTAL KNEE ARTHROPLASTY;  Surgeon: Mauri Pole, MD;  [facility];  Service: Orthopedics;  Laterality: Left;  . UPPER ESOPHAGEAL ENDOSCOPIC ULTRASOUND (EUS) N/A 05/14/2016   Procedure: UPPER ESOPHAGEAL ENDOSCOPIC ULTRASOUND (EUS);  Surgeon: Milus Banister, MD;  Location[facility];  Service: Endoscopy;  Laterality: N/A;  radial only-will be done mod if needed    Current Outpatient Medications  Medication Sig Dispense Refill  . acetaminophen (TYLENOL) 500 MG tablet Take 1,000 mg by mouth every 6 (six) hours as needed for moderate pain (for headaches).     Marland Kitchen albuterol (PROVENTIL) (2.5 MG/3ML) 0.083% nebulizer solution Take 3 mLs (2.5 mg total) by nebulization every 2 (two) hours as needed for wheezing or shortness of breath. 75 mL 5  . albuterol (VENTOLIN HFA) 108 (90 Base) MCG/ACT inhaler Inhale 2 puffs into the lungs every 6 (six) hours as needed for wheezing or shortness of breath. 3 each 3  . Alcohol Swabs (B-D SINGLE USE SWABS REGULAR) PADS     . allopurinol (ZYLOPRIM) 100 MG tablet Take 100 mg by mouth 2 (two) times daily.     Marland Kitchen ascorbic acid (VITAMIN C) 500 MG tablet Take 500 mg by mouth every morning.    Marland Kitchen atorvastatin (LIPITOR) 40 MG tablet Take 1 tablet (40 mg total) by mouth daily. 90 tablet 3  . Blood Glucose Calibration (TRUE METRIX LEVEL 1) Low SOLN     . Budeson-Glycopyrrol-Formoterol (BREZTRI AEROSPHERE) 160-9-4.8 MCG/ACT AERO Inhale 2 puffs into the lungs in the morning and at bedtime. 32.1 g 2  . cycloSPORINE (RESTASIS) 0.05 % ophthalmic emulsion Place 1 drop into both eyes 2 (two) times daily.    Marland Kitchen Dextromethorphan-guaiFENesin (TUSSIN DM MAX SUGAR-FREE PO) Take 5 mLs by mouth every 4 (four) hours as needed (for coughing).    Marland Kitchen ELIQUIS 5 MG TABS tablet TAKE 1 TABLET TWICE DAILY 180 tablet 2  . ferrous sulfate 325 (65 FE) MG tablet Take 1 tablet (325 mg total) by mouth daily with breakfast. 90  tablet 3  . fluticasone (FLONASE) 50 MCG/ACT nasal spray USE 2 SPRAYS NASALLY DAILY (Patient taking differently: Place 2 sprays into both nostrils daily as needed (allergies.).) 48 g 1  . HYDROcodone-acetaminophen (NORCO) 5-325 MG tablet Take 1 tablet by mouth every 6 (six) hours as needed for moderate pain or severe pain. 15 tablet 0  . insulin aspart (NOVOLOG) 100 UNIT/ML FlexPen Inject 10-17 Units into the skin See admin instructions. Inject 30-70 units into the skin three times a day with meals, per sliding scale (and an additional 1 unit for every 50 points for a BGL >130) Sliding scale    . insulin glargine (LANTUS) 100 UNIT/ML injection Inject 130 Units into the skin in the morning and at bedtime.    Marland Kitchen levothyroxine (SYNTHROID) 200 MCG tablet Take 175 mcg by mouth daily before breakfast.     . LORazepam (ATIVAN) 2 MG tablet Take 2 mg by mouth at bedtime as needed for anxiety.     Marland Kitchen losartan (COZAAR) 50 MG tablet Take 1 tablet (50 mg total) by mouth daily. 90 tablet 3  . magnesium oxide (MAG-OX) 400 (241.3 Mg) MG tablet Take 400 mg by mouth 3 (three) times daily.     . metoprolol succinate (TOPROL-XL) 100 MG 24 hr tablet Take 1 tablet (100 mg total) by mouth daily. Take with or immediately following a meal. 90 tablet 3  . nitroGLYCERIN (NITROSTAT) 0.4 MG SL tablet Place 1 tablet (0.4 mg total) under the tongue every 5 (five) minutes as needed for chest pain. 25 tablet 4  . predniSONE (DELTASONE) 10 MG tablet Take 10 mg by mouth daily.    Marland Kitchen PRESCRIPTION MEDICATION CPAP: At bedtime and "as needed for shortness of breath"    . torsemide (DEMADEX) 20 MG tablet Take 2 tablets (40 mg total) by mouth every other day. 90 tablet 3  .  TRUE METRIX BLOOD GLUCOSE TEST test strip     . TRUEplus Lancets 28G MISC     . vitamin B-12 (CYANOCOBALAMIN) 500 MCG tablet Take 500 mcg by mouth every morning.    . Vitamin D3 (VITAMIN D) 25 MCG tablet Take 1,000 Units by mouth every morning.     No current  facility-administered medications for this visit.    Allergies  Allergen Reactions  . Dulaglutide Nausea Only, Other (See Comments) and Cough    Made the patient sick to her stomach (only) several days after using it Trulicity      Review of Systems negative except from HPI and PMH  Physical Exam   BP 120/68   Pulse 80   Ht _0  (1.676 m)   Wt 241 lb (109.3 kg)   SpO2 95%   BMI 38.90 kg/m  Well developed and Morbidly obese in no acute distress HENT normal Neck supple  Clear Device pocket well healed; without hematoma or erythema.  There is no tethering   Regular rate and rhythm, no murmur Abd-soft with active BS No Clubbing cyanosis R>L 2+ edema Skin-warm and dry A & Oriented  Grossly normal sensory and motor function  ECG atrial fibrillation with ventricular pacing at 80 with fusion Intervals-/08/37  Assessment and Plan  Sinus node dysfunction   Atrial fibrillation-paroxysmal  High risk medication surveillance-amiodarone  Pacemaker-Medtronic     COPD  OSA treated   Hypertension  HFpEF  Patient continues to have paroxysms of atrial fibrillation associate with significant symptoms i.e. diaphoresis.  We discussed treatment strategies again, rhythm control versus rate control.  We discussed amiodarone and its side effect profile versus augmentation of rate control with either a calcium blocker in her digoxin to her beta-blocker.  She would like to pursue amiodarone at this point.  We will begin her on 400 twice daily for 2 weeks and 400 daily for 2 weeks and then 200 a day.  We will check her surveillance laboratories in 6 weeks time.  We will see her in 12 weeks time.  Atrial fibrillation burden is somewhat less about 15%, previously about 20%  She continues to have peripheral edema albeit asymmetric.  We will increase her Demadex from 40--60 every other day.  Some of her fluid retention may be related to her steroids not being used for polymyalgia  rheumatica  Blood pressure well controlled  Anemia needs further attention will defer to PCP  On iron but with normal ferritin

## 2020-07-10 ENCOUNTER — Encounter (HOSPITAL_COMMUNITY)
Admission: RE | Admit: 2020-07-10 | Discharge: 2020-07-10 | Disposition: A | Discharge status: Home / Self Care | Payer: Medicare HMO | Source: Ambulatory Visit | Attending: Family Medicine | Admitting: Family Medicine

## 2020-07-10 ENCOUNTER — Encounter (HOSPITAL_COMMUNITY): Payer: Self-pay

## 2020-07-10 DIAGNOSIS — M25551 Pain in right hip: Secondary | ICD-10-CM

## 2020-07-10 DIAGNOSIS — S82141D Displaced bicondylar fracture of right tibia, subsequent encounter for closed fracture with routine healing: Secondary | ICD-10-CM | POA: Insufficient documentation

## 2020-07-17 LAB — CUP PACEART INCLINIC DEVICE CHECK
Date Time Interrogation Session: 20220308153000
Implantable Lead Implant Date: 20170707
Implantable Lead Implant Date: 20170707
Implantable Lead Location: 753859
Implantable Lead Location: 753860
Implantable Lead Model: 3830
Implantable Lead Model: 5076
Implantable Pulse Generator Implant Date: 20170707

## 2020-07-22 DIAGNOSIS — E113213 Type 2 diabetes mellitus with mild nonproliferative diabetic retinopathy with macular edema, bilateral: Secondary | ICD-10-CM | POA: Diagnosis not present

## 2020-07-29 ENCOUNTER — Telehealth: Payer: Self-pay

## 2020-07-29 ENCOUNTER — Other Ambulatory Visit: Payer: Self-pay | Admitting: Internal Medicine

## 2020-07-29 NOTE — Telephone Encounter (Signed)
PA required for Monovisc, right knee submitted online through [facility]. Pending PA# [id number]

## 2020-07-31 DIAGNOSIS — E890 Postprocedural hypothyroidism: Secondary | ICD-10-CM | POA: Diagnosis not present

## 2020-07-31 DIAGNOSIS — N1832 Chronic kidney disease, stage 3b: Secondary | ICD-10-CM | POA: Diagnosis not present

## 2020-07-31 DIAGNOSIS — Z9181 History of falling: Secondary | ICD-10-CM | POA: Diagnosis not present

## 2020-07-31 DIAGNOSIS — E1165 Type 2 diabetes mellitus with hyperglycemia: Secondary | ICD-10-CM | POA: Diagnosis not present

## 2020-07-31 DIAGNOSIS — M353 Polymyalgia rheumatica: Secondary | ICD-10-CM | POA: Diagnosis not present

## 2020-07-31 DIAGNOSIS — Z89412 Acquired absence of left great toe: Secondary | ICD-10-CM | POA: Diagnosis not present

## 2020-07-31 DIAGNOSIS — Z1331 Encounter for screening for depression: Secondary | ICD-10-CM | POA: Diagnosis not present

## 2020-07-31 DIAGNOSIS — I5030 Unspecified diastolic (congestive) heart failure: Secondary | ICD-10-CM | POA: Diagnosis not present

## 2020-07-31 DIAGNOSIS — Z79899 Other long term (current) drug therapy: Secondary | ICD-10-CM | POA: Diagnosis not present

## 2020-07-31 DIAGNOSIS — I480 Paroxysmal atrial fibrillation: Secondary | ICD-10-CM | POA: Diagnosis not present

## 2020-07-31 DIAGNOSIS — E1140 Type 2 diabetes mellitus with diabetic neuropathy, unspecified: Secondary | ICD-10-CM | POA: Diagnosis not present

## 2020-07-31 DIAGNOSIS — E1149 Type 2 diabetes mellitus with other diabetic neurological complication: Secondary | ICD-10-CM | POA: Diagnosis not present

## 2020-08-02 ENCOUNTER — Telehealth: Payer: Self-pay

## 2020-08-02 NOTE — Telephone Encounter (Signed)
Updated information is needed for PA to be approved for Monovisc, right knee.   Please provide non-drug therapy attempts such as -Ice, heat, elevation, PT, Home exercise, assistive devices, weight loss, and patient education.   Please advise if possible.  Thank you.

## 2020-08-05 ENCOUNTER — Telehealth: Payer: Self-pay | Admitting: Family Medicine

## 2020-08-05 DIAGNOSIS — M1711 Unilateral primary osteoarthritis, right knee: Secondary | ICD-10-CM

## 2020-08-05 NOTE — Telephone Encounter (Signed)
No Problem. Thank you.

## 2020-08-05 NOTE — Telephone Encounter (Signed)
Noted  

## 2020-08-05 NOTE — Telephone Encounter (Signed)
Updated note has been submitted to [facility].

## 2020-08-05 NOTE — Telephone Encounter (Signed)
I called the patient to see which of these conservative treatments the patient has had for the right knee: she alternates applying heat and ice, elevates the leg when sitting, does some exercises at home - mainly ROM and she walks with the assistance of a rolling walker. Unsure if there has been any discussion of weight loss or patient education, specifically for the right knee OA - she did have instruction/education on her right tibial plateau fracture.  Is it possible for you to dictate a small note regarding the above information - to send to the patient's insurance company for prior authorization for Monovisc injection?

## 2020-08-05 NOTE — Telephone Encounter (Signed)
Note is now in chart.

## 2020-08-05 NOTE — Telephone Encounter (Signed)
Patient continues to struggle with right knee pain.  Treatment of her pain has included the following:  She alternates applying heat and ice, elevates the leg when sitting, does some exercises at home - mainly range of motion, and she walks with the assistance of a rolling walker.   We will attempt to get approval for gel injections.

## 2020-08-05 NOTE — Telephone Encounter (Signed)
Prior auth for Physical Therapy. Pending due to lack information so call her back. She needs the stuff submitted today or you'll have to file a pier to [employer]. Tracking number [id number]. Elwin Sleight [id number]

## 2020-08-05 NOTE — Telephone Encounter (Signed)
This is the note I discussed with you earlier. I have called the patient to get what treatments have been tried thus far. I sent that message to Dr. Junius Roads to see if he will dictate a note regarding this.

## 2020-08-05 NOTE — Telephone Encounter (Signed)
Please see the newly dictated note.

## 2020-08-06 ENCOUNTER — Telehealth: Payer: Self-pay

## 2020-08-06 NOTE — Telephone Encounter (Signed)
Approved for Monovisc, right knee. [place] Patient will be responsible for 20% OOP. Co-pay of $20.00 PA Approval# [id number] Valid 07/29/2020- 08/28/2020  Appt. 08/09/2020 with Dr. Junius Roads

## 2020-08-09 ENCOUNTER — Other Ambulatory Visit: Payer: Self-pay

## 2020-08-09 ENCOUNTER — Ambulatory Visit: Payer: Medicare HMO | Admitting: Family Medicine

## 2020-08-09 ENCOUNTER — Encounter: Payer: Self-pay | Admitting: Family Medicine

## 2020-08-09 DIAGNOSIS — M1711 Unilateral primary osteoarthritis, right knee: Secondary | ICD-10-CM

## 2020-08-09 NOTE — Progress Notes (Signed)
Office Visit Note   Patient: Kayla [name]           Date of Birth: 1940-08-13           MRN: [id number] Visit Date: 08/09/2020 Requested by: [name], MD [place],  [place] [id number] PCP: [name], MD  Subjective: Chief Complaint  Patient presents with  . Right Knee - Follow-up    Planned Monovisc injection    HPI: She is here for planned Monovisc injection for right knee osteoarthritis.              ROS:   All other systems were reviewed and are negative.  Objective: Vital Signs: There were no vitals taken for this visit.  Physical Exam:  General:  Alert and oriented, in no acute distress. Pulm:  Breathing unlabored. Psy:  Normal mood, congruent affect. Skin: No erythema Right knee: 1+ effusion with no warmth.  Full active extension.  Tender over the lateral patellofemoral joint.  Imaging: No results found.  Assessment & Plan: 1.  Right knee osteoarthritis -Monovisc today.  She states that she wants to consider knee replacement if this does not help, since she did very well after left knee replacement.     Procedures: Right knee injection: After sterile prep with Betadine, injected 3 cc 0.25% bupivacaine and Monovisc from lateral midpatellar approach, a flash of clear yellow synovial fluid was obtained prior to injection.       PMFS History: Patient Active Problem List   Diagnosis Date Noted  . Polymyalgia rheumatica ([place]) 04/15/2020  . Temporal arteritis ([place]) 04/15/2020  . Pacemaker - MDT 01/24/2020  . Orthostatic lightheadedness 01/24/2020  . Chronic osteomyelitis involving left ankle and foot ([place])   . Paroxysmal A-fib ([place]) 10/30/2019  . Paroxysmal atrial fibrillation ([place]) 10/25/2019  . Secondary hypercoagulable state ([place]) 10/25/2019  . Numbness and tingling of right leg 10/19/2019  . Lumbar radiculopathy 10/19/2019  . Acute exacerbation of CHF (congestive heart failure) ([place]) 08/13/2019  . COPD with acute  exacerbation ([place]) 08/13/2019  . Acute respiratory failure ([employer]) 08/13/2019  . Chest pain 08/13/2019  . Leukocytosis 08/13/2019  . Leg swelling 12/09/2018  . Urinary frequency 12/09/2018  . Pain in joint of right hip 01/21/2018  . Myofascial pain syndrome, cervical 01/06/2018  . Occipital neuralgia of right side 01/06/2018  . RUQ abdominal pain   . Gastritis and gastroduodenitis   . Bile duct abnormality   . Cholecystitis 04/30/2016  . Gall bladder disease 04/28/2016  . Sinus node dysfunction ([place]) 11/08/2015  . Ejection fraction   . Spinal stenosis of cervical region 11/07/2014  . Preoperative clearance 10/09/2014  . Carotid artery disease ([place])   . S/P left TKA 07/27/2011  . Dizzy spells 05/04/2011  . Palpitations 01/21/2011  . RHINITIS 07/21/2010  . Hyperlipidemia 11/24/2008  . OBESITY 11/24/2008  . Coronary atherosclerosis 11/24/2008  . VENTRICULAR HYPERTROPHY, LEFT 11/24/2008  . HYPOTHYROIDISM, POSTSURGICAL 06/04/2008  . Headache 06/04/2008  . DM (diabetes mellitus) ([place]) 03/23/2007  . G E R D 03/23/2007  . DIVERTICULOSIS OF COLON 03/23/2007  . GENERALIZED OSTEOARTHROSIS UNSPECIFIED SITE 03/23/2007  . OBSTRUCTIVE SLEEP APNEA 02/16/2007  . Essential hypertension 02/16/2007  . COPD 02/16/2007   Past Medical History:  Diagnosis Date  . A-fib ([place])   . Allergy   . Anxiety   . Arthritis   . CAD (coronary artery disease)    Mild per remote cath in 2006, /    Nuclear, January, 2013,  no significant abnormality,  . Carotid artery disease ([place])    Doppler, January, 2013, 0 25% RIC A., 05-39% LICA, stable  . Cataract   . CHF (congestive heart failure) ([place])   . Complication of anesthesia    wakes up "mean"  . COPD 02/16/2007   Intolerant of Spiriva, tudorza Intolerant of Trelegy   . COPD (chronic obstructive pulmonary disease) ([place])   . Coronary atherosclerosis 11/24/2008   Catheterization 2006, nonobstructive coronary disease   //   nuclear 2013, low risk    .  Diabetes mellitus, type 2 ([place])   . Diverticula of colon   . DIVERTICULOSIS OF COLON 03/23/2007   Qualifier: Diagnosis of  By: Halford Chessman MD, Vineet    . Dizziness    occasional  . Dizzy spells 05/04/2011  . DM (diabetes mellitus) ([place]) 03/23/2007   Qualifier: Diagnosis of  By: Halford Chessman MD, Vineet    . Dyspnea    On exertion  . Ejection fraction   . Essential hypertension 02/16/2007   Qualifier: Diagnosis of  By: Rosana Hoes CMA, Tammy    . G E R D 03/23/2007   Qualifier: Diagnosis of  By: Halford Chessman MD, Vineet    . Gall bladder disease 04/28/2016  . Gastritis and gastroduodenitis   . GERD (gastroesophageal reflux disease)   . Goiter    hx of  . Headache(784.0)    migraine  . Hyperlipidemia   . Hypertension   . Hypothyroidism   . HYPOTHYROIDISM, POSTSURGICAL 06/04/2008   Qualifier: Diagnosis of  By: Loanne Drilling MD, Jacelyn Pi   . Left ventricular hypertrophy   . Leg edema    occasional swelling  . Leg swelling 12/09/2018  . Myofascial pain syndrome, cervical 01/06/2018  . Obesity   . OBESITY 11/24/2008   Qualifier: Diagnosis of  By: Sidney Ace    . OBSTRUCTIVE SLEEP APNEA 02/16/2007   Auto CPAP 09/03/13 to 10/02/13 >> used on 30 of 30 nights with average 6 hrs and 3 min.  Average AHI is 3.1 with median CPAP 5 cm H2O and 95 th percentile CPAP 6 cm H2O.    . Occipital neuralgia of right side 01/06/2018  . OSA (obstructive sleep apnea)    cpap setting of 12  . Pain in joint of right hip 01/21/2018  . Palpitations 01/21/2011   48 hour Holter, 2015, scattered PACs and PVCs.   . Pneumonia   . PONV (postoperative nausea and vomiting)    severe after every surgery  . Pulmonary hypertension, secondary   . RHINITIS 07/21/2010       . RUQ abdominal pain   . S/P left TKA 07/27/2011  . Sinus node dysfunction ([place]) 11/08/2015  . Sleep apnea   . Spinal stenosis of cervical region 11/07/2014  . Urinary frequency 12/09/2018  . UTI (urinary tract infection) 08/15/2019   per pt report  . VENTRICULAR HYPERTROPHY, LEFT  11/24/2008   Qualifier: Diagnosis of  By: Sidney Ace      Family History  Problem Relation Age of Onset  . Heart Problems Mother   . Heart defect Mother        valve problems  . Heart Problems Father   . Heart disease Brother   . Lung cancer Daughter        non small cell   . Diabetes Maternal Aunt   . Diabetes Maternal Uncle   . Throat cancer Brother   . Colon cancer Neg Hx   . Colon polyps Neg Hx   . Esophageal  cancer Neg Hx   . Rectal cancer Neg Hx   . Stomach cancer Neg Hx     Past Surgical History:  Procedure Laterality Date  . ABDOMINAL HYSTERECTOMY     with appendectomy and colon polypectomy  . AMPUTATION TOE Left 12/15/2019   Procedure: AMPUTATION  LEFT GREAT TOE;  Surgeon: Evelina Bucy, DPM;  [facility];  Service: Podiatry;  Laterality: Left;  . ANTERIOR CERVICAL DECOMP/DISCECTOMY FUSION N/A 11/07/2014   Procedure: Anterior Cervical diskectomy and fusion Cervical four-five, Cervical five-six, Cervical six-seven;  Surgeon: Kary Kos, MD;  Location: [facility];  Service: Neurosurgery;  Laterality: N/A;  . APPENDECTOMY    . ARTERY BIOPSY Right 02/16/2017   Procedure: BIOPSY TEMPORAL ARTERY;  Surgeon: Katha Cabal, MD;  Location: [facility];  Service: Vascular;  Laterality: Right;  . ARTERY BIOPSY Left 04/16/2020   Procedure: BIOPSY TEMPORAL ARTERY;  Surgeon: Katha Cabal, MD;  Location: [facility];  Service: Vascular;  Laterality: Left;  LEFT TEMPLE  . BACK SURGERY    . CARDIAC CATHETERIZATION    . CATARACT EXTRACTION W/ INTRAOCULAR LENS  IMPLANT, BILATERAL Bilateral   . CHOLECYSTECTOMY N/A 04/29/2016   Procedure: LAPAROSCOPIC CHOLECYSTECTOMY WITH INTRAOPERATIVE CHOLANGIOGRAM;  Surgeon: Alphonsa Overall, MD;  [facility];  Service: General;  Laterality: N/A;  . EP IMPLANTABLE DEVICE N/A 11/08/2015   MRI COMPATABLE -- Procedure: Pacemaker Implant;  Surgeon: Deboraha Sprang, MD;  Location: [facility];  Service: Cardiovascular;  Laterality: N/A;   . INSERT / REPLACE / REMOVE PACEMAKER  2017   Dr. Jens Som [facility])  . JOINT REPLACEMENT    . LUMBAR DISC SURGERY  1990's   x 2  . THYROIDECTOMY  2011  . TOTAL KNEE ARTHROPLASTY  07/27/2011   Procedure: TOTAL KNEE ARTHROPLASTY;  Surgeon: Mauri Pole, MD;  [facility];  Service: Orthopedics;  Laterality: Left;  . UPPER ESOPHAGEAL ENDOSCOPIC ULTRASOUND (EUS) N/A 05/14/2016   Procedure: UPPER ESOPHAGEAL ENDOSCOPIC ULTRASOUND (EUS);  Surgeon: Milus Banister, MD;  Location: [facility];  Service: Endoscopy;  Laterality: N/A;  radial only-will be done mod if needed   Social History   Occupational History  . Occupation: retired  Tobacco Use  . Smoking status: Former Smoker    Packs/day: 2.00    Years: 33.00    Pack years: 66.00    Types: Cigarettes    Start date: 1966    Quit date: 05/04/1997    Years since quitting: 23.2  . Smokeless tobacco: Never Used  Vaping Use  . Vaping Use: Never used  Substance and Sexual Activity  . Alcohol use: No  . Drug use: No  . Sexual activity: Not on file

## 2020-08-13 ENCOUNTER — Ambulatory Visit: Payer: Medicare HMO | Admitting: Podiatry

## 2020-08-13 ENCOUNTER — Other Ambulatory Visit: Payer: Self-pay

## 2020-08-13 DIAGNOSIS — E1169 Type 2 diabetes mellitus with other specified complication: Secondary | ICD-10-CM

## 2020-08-13 DIAGNOSIS — B351 Tinea unguium: Secondary | ICD-10-CM

## 2020-08-13 DIAGNOSIS — I872 Venous insufficiency (chronic) (peripheral): Secondary | ICD-10-CM

## 2020-08-13 DIAGNOSIS — E1142 Type 2 diabetes mellitus with diabetic polyneuropathy: Secondary | ICD-10-CM

## 2020-08-13 NOTE — Patient Instructions (Signed)
We have ordered a Venous Reflux study at:  [facility] [address] [place],  [place]  [address] Get Driving Directions Main: [phone]

## 2020-08-20 ENCOUNTER — Other Ambulatory Visit: Payer: Medicare HMO | Admitting: *Deleted

## 2020-08-20 ENCOUNTER — Other Ambulatory Visit: Payer: Self-pay

## 2020-08-20 DIAGNOSIS — Z950 Presence of cardiac pacemaker: Secondary | ICD-10-CM

## 2020-08-20 DIAGNOSIS — I495 Sick sinus syndrome: Secondary | ICD-10-CM | POA: Diagnosis not present

## 2020-08-20 DIAGNOSIS — Z79899 Other long term (current) drug therapy: Secondary | ICD-10-CM | POA: Diagnosis not present

## 2020-08-20 DIAGNOSIS — I480 Paroxysmal atrial fibrillation: Secondary | ICD-10-CM

## 2020-08-20 LAB — COMPREHENSIVE METABOLIC PANEL
ALT: 17 IU/L (ref 0–32)
AST: 14 IU/L (ref 0–40)
Albumin/Globulin Ratio: 1.4 (ref 1.2–2.2)
Albumin: 3.7 g/dL (ref 3.7–4.7)
Alkaline Phosphatase: 88 IU/L (ref 44–121)
BUN/Creatinine Ratio: 23 (ref 12–28)
BUN: 41 mg/dL — ABNORMAL HIGH (ref 8–27)
Bilirubin Total: <0.2 mg/dL (ref 0.0–1.2)
CO2: 26 mmol/L (ref 20–29)
Calcium: 10 mg/dL (ref 8.7–10.3)
Chloride: 98 mmol/L (ref 96–106)
Creatinine, Ser: 1.78 mg/dL — ABNORMAL HIGH (ref 0.57–1.00)
Globulin, Total: 2.6 g/dL (ref 1.5–4.5)
Glucose: 304 mg/dL — ABNORMAL HIGH (ref 65–99)
Potassium: 5.2 mmol/L (ref 3.5–5.2)
Sodium: 140 mmol/L (ref 134–144)
Total Protein: 6.3 g/dL (ref 6.0–8.5)
eGFR: 29 mL/min/{1.73_m2} — ABNORMAL LOW (ref >59)

## 2020-08-20 LAB — TSH: TSH: 0.74 u[IU]/mL (ref 0.450–4.500)

## 2020-08-23 ENCOUNTER — Ambulatory Visit (HOSPITAL_COMMUNITY)
Admission: RE | Admit: 2020-08-23 | Discharge: 2020-08-23 | Disposition: A | Discharge status: Home / Self Care | Payer: Medicare HMO | Source: Ambulatory Visit | Attending: Podiatry | Admitting: Podiatry

## 2020-08-23 ENCOUNTER — Other Ambulatory Visit: Payer: Self-pay

## 2020-08-23 DIAGNOSIS — I872 Venous insufficiency (chronic) (peripheral): Secondary | ICD-10-CM | POA: Diagnosis not present

## 2020-08-30 ENCOUNTER — Telehealth: Payer: Self-pay

## 2020-08-30 NOTE — Telephone Encounter (Signed)
Spoke with pt and advised per Dr Caryl Comes labs are normal with exception of BS and renal function.  Pt states her PCP does follow both of these.  Pt verbalizes understanding and thanked Therapist, sports for the call.

## 2020-08-30 NOTE — Telephone Encounter (Signed)
-----   Message from Deboraha Sprang, MD sent at 08/28/2020  5:34 AM EDT -----  Please inform patient that drug surveillance labs are normal  But her BS and renal function are elevated and should ber followup by her PCP

## 2020-09-02 DIAGNOSIS — E113213 Type 2 diabetes mellitus with mild nonproliferative diabetic retinopathy with macular edema, bilateral: Secondary | ICD-10-CM | POA: Diagnosis not present

## 2020-09-02 DIAGNOSIS — H353132 Nonexudative age-related macular degeneration, bilateral, intermediate dry stage: Secondary | ICD-10-CM | POA: Diagnosis not present

## 2020-09-02 DIAGNOSIS — H26493 Other secondary cataract, bilateral: Secondary | ICD-10-CM | POA: Diagnosis not present

## 2020-09-02 DIAGNOSIS — H35033 Hypertensive retinopathy, bilateral: Secondary | ICD-10-CM | POA: Diagnosis not present

## 2020-09-05 NOTE — Progress Notes (Signed)
  Subjective:  Patient ID: Kayla [name], female    DOB: 1941/01/05,  MRN: [id number]  Chief Complaint  Patient presents with  . Nail Problem    RFC Nail trim  . Foot Swelling    Left foot edema. And Left great toe discoloration per pt.     DOS: 12/15/19 Procedure: Left great toe partial amputation   80 y.o. female presents with the above complaint. History confirmed with patient.  Complains of swelling to the left foot is concerned that it could be due to infection.  Objective:  Physical Exam: no tenderness at the surgical site. Incision: well healed without ulceration Onychomycosis of remaining digits. Good warmth to foot. Protective sensation absent.   Assessment:   1. Venous (peripheral) insufficiency   2. Onychomycosis of multiple toenails with type 2 diabetes mellitus and peripheral neuropathy ([place])     Plan:  Patient was evaluated and treated and all questions answered.  S/p Left partial hallux amputation -Well healed  Diabetes with DPN, Onychomycosis -Nails debrided x9  Venous insufficiency left -Discussed diagnosis of venous insufficiency and this could be contributing to pain in the ankle and swelling.  Low risk for DVT however will order ultrasound to rule out DVT and assess for venous reflux     Return in about 3 weeks (around 09/03/2020) for Ultrasound f/u.

## 2020-09-06 ENCOUNTER — Ambulatory Visit (INDEPENDENT_AMBULATORY_CARE_PROVIDER_SITE_OTHER): Payer: Medicare HMO | Admitting: Podiatry

## 2020-09-06 DIAGNOSIS — Z5329 Procedure and treatment not carried out because of patient's decision for other reasons: Secondary | ICD-10-CM

## 2020-09-06 NOTE — Progress Notes (Signed)
No show for appt. 

## 2020-09-07 ENCOUNTER — Other Ambulatory Visit (HOSPITAL_COMMUNITY): Payer: Self-pay | Admitting: Physician Assistant

## 2020-09-09 NOTE — Telephone Encounter (Signed)
Eliquis 5mg  refill request received. Patient is 80 years old, weight-109.3kg, Crea-1.78 on 08/20/20, Diagnosis-Afib, and last seen by Dr. Caryl Comes on 07/09/20. Dose is appropriate based on dosing criteria. Will send in refill to requested pharmacy.

## 2020-09-11 ENCOUNTER — Other Ambulatory Visit: Payer: Self-pay | Admitting: Cardiology

## 2020-09-11 DIAGNOSIS — E890 Postprocedural hypothyroidism: Secondary | ICD-10-CM | POA: Diagnosis not present

## 2020-09-11 DIAGNOSIS — N1832 Chronic kidney disease, stage 3b: Secondary | ICD-10-CM | POA: Diagnosis not present

## 2020-09-11 DIAGNOSIS — M353 Polymyalgia rheumatica: Secondary | ICD-10-CM | POA: Diagnosis not present

## 2020-09-13 ENCOUNTER — Ambulatory Visit: Payer: Medicare HMO | Admitting: Podiatry

## 2020-09-13 ENCOUNTER — Other Ambulatory Visit: Payer: Self-pay

## 2020-09-13 DIAGNOSIS — R600 Localized edema: Secondary | ICD-10-CM

## 2020-09-13 DIAGNOSIS — I872 Venous insufficiency (chronic) (peripheral): Secondary | ICD-10-CM | POA: Diagnosis not present

## 2020-09-13 NOTE — Progress Notes (Signed)
Subjective:  Patient ID: Kayla [name], female    DOB: [birth year],  MRN: [id number]  Chief Complaint  Patient presents with  . Foot Swelling    Bilateral swelling in feet, pt wants to discuss what causes it.   DOS: 12/15/19 Procedure: Left great toe partial amputation   80 y.o. female presents with the above complaint. History confirmed with patient. Having swelling of both legs and right arm. Objective:  Physical Exam: no tenderness at the surgical site. Incision: well healed without ulceration Onychomycosis of remaining digits. Good warmth to foot. Protective sensation absent.   Study Result  Lower Venous Reflux Study   Patient Name: Kayla [name] Date of Exam:  08/23/2020  Medical Rec #: [id number]     Accession #:  [id number]  Date of Birth: [birth year]    Patient Gender: F  Patient Age:  79Y  Exam Location: [facility]  Procedure:   VAS [place] LOWER EXTREMITY VENOUS REFLUX  Referring Phys: [id number] [place]    ---------------------------------------------------------------------------  -----    Indications: Edema.    Limitations: Body habitus, poor ultrasound/tissue interface and Patient's  heavy breathing.  Performing Technologist: June Leap RDMS, RVT     Examination Guidelines: A complete evaluation includes B-mode imaging,  spectral  Doppler, color Doppler, and power Doppler as needed of all accessible  portions  of each vessel. Bilateral testing is considered an integral part of a  complete  examination. Limited examinations for reoccurring indications may be  performed  as noted. The reflux portion of the exam is performed with the patient in  reverse Trendelenburg.  Significant venous reflux is defined as >500 ms in the superficial venous  system, and >1 second in the deep venous system.     Venous Reflux Times  +--------------+---------+------+-----------+------------+--------+  RIGHT      Reflux NoRefluxReflux TimeDiameter cmsComments               Yes                   +--------------+---------+------+-----------+------------+--------+  CFV      no                         +--------------+---------+------+-----------+------------+--------+  FV mid    no                         +--------------+---------+------+-----------+------------+--------+  Popliteal   no                         +--------------+---------+------+-----------+------------+--------+  GSV at [facility]                      [place]     +--------------+---------+------+-----------+------------+--------+  GSV prox thighno               0.37        +--------------+---------+------+-----------+------------+--------+  GSV mid thigh no               0.3         +--------------+---------+------+-----------+------------+--------+  GSV dist thighno               0.31        +--------------+---------+------+-----------+------------+--------+  GSV at knee  no               0.3         +--------------+---------+------+-----------+------------+--------+  GSV prox calf no  0.32        +--------------+---------+------+-----------+------------+--------+  SSV Pop Fossa no               0.23        +--------------+---------+------+-----------+------------+--------+  SSV prox calf no               0.14        +--------------+---------+------+-----------+------------+--------+  SSV mid calf no               0.2         +--------------+---------+------+-----------+------------+--------+     +--------------+---------+------+-----------+------------+--------+   LEFT     Reflux NoRefluxReflux TimeDiameter cmsComments               Yes                   +--------------+---------+------+-----------+------------+--------+  CFV      no                         +--------------+---------+------+-----------+------------+--------+  Popliteal   no                         +--------------+---------+------+-----------+------------+--------+  GSV at [facility]                      [place]     +--------------+---------+------+-----------+------------+--------+  GSV prox thighno               0.33        +--------------+---------+------+-----------+------------+--------+  GSV mid thigh no               0.39        +--------------+---------+------+-----------+------------+--------+  GSV dist thighno               0.44        +--------------+---------+------+-----------+------------+--------+  GSV at knee  no               0.38        +--------------+---------+------+-----------+------------+--------+  GSV prox calf no               0.31        +--------------+---------+------+-----------+------------+--------+  SSV Pop Fossa no               0.23        +--------------+---------+------+-----------+------------+--------+  SSV prox calf no               0.23        +--------------+---------+------+-----------+------------+--------+  SSV mid calf no               0.25        +--------------+---------+------+-----------+------------+--------+   Summary:  Right:  - There is no evidence of venous reflux seen in the right lower extremity.  - No evidence of deep or superficial vein thrombosis in the visualized  veins of the lower  extremity.    Left:  - There is no evidence of venous reflux seen in the left lower extremity.  - No evidence of deep or superficial vein thrombosis in the visualized  veins of the lower extremity    Assessment:   1. Venous (peripheral) insufficiency   2. Localized edema     Plan:  Patient was evaluated and treated and all questions answered.  Venous insufficiency left -[place] without signs of DVT nor venous reflux -She does have significant upper and lower extremity swelling. I think it is more likely related to CHF  Return in about 1 month (around 10/14/2020) for Diabetic Foot Care.

## 2020-09-17 ENCOUNTER — Telehealth: Payer: Self-pay | Admitting: Internal Medicine

## 2020-09-17 NOTE — Telephone Encounter (Signed)
Spoke with EP [facility], and she was able to move the pts appt up to a sooner date.  Pt will see Kayla Marshall NP next Tuesday 5/24 at 1020.  Pt made aware of appt date and time.  Informed the pt that we are still awaiting further advisement from Dr. Caryl Comes, in regards to any med changes to be made in the interim.  Informed the pt that once he advises, his RN Kayla [name] will follow-up with her, shortly thereafter.  Pt verbalized understanding and agrees with this plan.

## 2020-09-17 NOTE — Telephone Encounter (Signed)
Pt c/o Shortness Of Breath: STAT if SOB developed within the last 24 hours or pt is noticeably SOB on the phone  1. Are you currently SOB (can you hear that pt is SOB on the phone)? YES  2. How long have you been experiencing SOB? A few weeks  3. Are you SOB when sitting or when up moving around? Both  4. Are you currently experiencing any other symptoms? NO

## 2020-09-17 NOTE — Telephone Encounter (Signed)
Pt call directed straight to triage nurse for further assistance.  Pt is calling to ask if she could simply get her follow-up appt with Dr. Caryl Comes or [name] PA-C moved up to a sooner date.  Pt is scheduled right now to see Jonni Sanger on 6/6.  Pt states over the last 2 weeks, her sob has worsened.  She states she has increased sob and DOE.  Pt states she has notable bilateral lower extremity edema.  Pt denies any chest pain, palpitations, N/V, diaphoresis, pre-syncopal or syncopal episodes.  Pt states she is taking her medications, especially her diuretic, as prescribed. Pt states she is urinating well during the days she is taking her diuretic, but on her off days, she is not urinating as frequently.  Pt states she does not weigh herself daily. Pt has no vital signs to provide me over the phone. Pt states she is watching her salt intake, wearing her compression stockings during the day, and elevating her extremities at rest.  Pt does sound extra winded on the phone.  Pt states she is in no acute distress at this time.  Pt does have a history of heart failure.  Advised the pt to continue with her diuretic as prescribed, continue wearing her compression stockings, and elevate her extremities at rest. Did advise the pt to start dry weighing herself daily and recording this.  Pt education provided.  I do feel it is reasonable to have this pts follow-up appt moved up with EP, for complaints mentioned.  Informed the pt that I will route this message to Dr. Caryl Comes and his RN for further review, advisement, and follow-up with the pt thereafter. Will also include EP Scheduler in on this message, to help assist with getting the pt a sooner follow-up appt with EP. ED precautions provided to the pt, if symptoms were to worsen in the interim. Pt verbalized understanding and agrees with this plan.

## 2020-09-19 ENCOUNTER — Ambulatory Visit (INDEPENDENT_AMBULATORY_CARE_PROVIDER_SITE_OTHER): Payer: Medicare HMO

## 2020-09-19 DIAGNOSIS — I495 Sick sinus syndrome: Secondary | ICD-10-CM | POA: Diagnosis not present

## 2020-09-19 DIAGNOSIS — I480 Paroxysmal atrial fibrillation: Secondary | ICD-10-CM

## 2020-09-19 NOTE — Telephone Encounter (Signed)
Attempted phone call to pt to advise per Dr Caryl Comes increase Torsemide to 80mg  qd x 3 days then return to regular dosing.  Left voicemail message to contact RN at [phone].

## 2020-09-23 LAB — CUP PACEART REMOTE DEVICE CHECK
Battery Remaining Longevity: 63 mo
Battery Voltage: 2.99 V
Brady Statistic AP VP Percent: 1.41 %
Brady Statistic AP VS Percent: 98.09 %
Brady Statistic AS VP Percent: 0.03 %
Brady Statistic AS VS Percent: 0.48 %
Brady Statistic RA Percent Paced: 99.38 %
Brady Statistic RV Percent Paced: 1.6 %
Date Time Interrogation Session: 20220519072142
Implantable Lead Implant Date: 20170707
Implantable Lead Implant Date: 20170707
Implantable Lead Location: 753859
Implantable Lead Location: 753860
Implantable Lead Model: 3830
Implantable Lead Model: 5076
Implantable Pulse Generator Implant Date: 20170707
Lead Channel Impedance Value: 304 Ohm
Lead Channel Impedance Value: 380 Ohm
Lead Channel Impedance Value: 399 Ohm
Lead Channel Impedance Value: 418 Ohm
Lead Channel Pacing Threshold Amplitude: 0.875 V
Lead Channel Pacing Threshold Amplitude: 1.875 V
Lead Channel Pacing Threshold Pulse Width: 0.4 ms
Lead Channel Pacing Threshold Pulse Width: 0.4 ms
Lead Channel Sensing Intrinsic Amplitude: 3.625 mV
Lead Channel Sensing Intrinsic Amplitude: 3.625 mV
Lead Channel Sensing Intrinsic Amplitude: 6.375 mV
Lead Channel Sensing Intrinsic Amplitude: 6.375 mV
Lead Channel Setting Pacing Amplitude: 1.75 V
Lead Channel Setting Pacing Amplitude: 2.5 V
Lead Channel Setting Pacing Pulse Width: 1 ms
Lead Channel Setting Sensing Sensitivity: 2.8 mV

## 2020-09-23 NOTE — Telephone Encounter (Signed)
Spoke with pt and advised per Dr Caryl Comes increase Torsemide to 80mg  (4tablets) x 3 days and then return to regular dosing.  Pt is scheduled to see [name], PA-C 09/24/2020.  Pt states she continues to have LEE and SOB and will plan to keep appointment with PA tomorrow since she does not have the 3 days to try the increase in medication as Dr Caryl Comes suggested.  Reviewed ED precautions.  Pt verbalizes understanding and thanked Therapist, sports for the phone call.

## 2020-09-24 ENCOUNTER — Ambulatory Visit: Payer: Medicare HMO | Admitting: Nurse Practitioner

## 2020-09-24 ENCOUNTER — Other Ambulatory Visit: Payer: Self-pay

## 2020-09-24 ENCOUNTER — Ambulatory Visit: Payer: Medicare HMO | Admitting: Pulmonary Disease

## 2020-09-24 ENCOUNTER — Encounter: Payer: Self-pay | Admitting: Nurse Practitioner

## 2020-09-24 VITALS — BP 148/60 | HR 81 | Ht 66.0 in | Wt 258.8 lb

## 2020-09-24 DIAGNOSIS — I495 Sick sinus syndrome: Secondary | ICD-10-CM | POA: Diagnosis not present

## 2020-09-24 DIAGNOSIS — I480 Paroxysmal atrial fibrillation: Secondary | ICD-10-CM

## 2020-09-24 DIAGNOSIS — I5030 Unspecified diastolic (congestive) heart failure: Secondary | ICD-10-CM | POA: Diagnosis not present

## 2020-09-24 DIAGNOSIS — I10 Essential (primary) hypertension: Secondary | ICD-10-CM | POA: Diagnosis not present

## 2020-09-24 DIAGNOSIS — L03115 Cellulitis of right lower limb: Secondary | ICD-10-CM | POA: Diagnosis not present

## 2020-09-24 DIAGNOSIS — L02415 Cutaneous abscess of right lower limb: Secondary | ICD-10-CM | POA: Diagnosis not present

## 2020-09-24 MED ORDER — CEPHALEXIN 250 MG PO CAPS: 250.0000 mg | ORAL_CAPSULE | Freq: Two times a day (BID) | ORAL | 0 refills | Status: AC

## 2020-09-24 NOTE — Progress Notes (Signed)
Electrophysiology Office Note Date: 09/24/2020  ID:  Kayla [name], Kayla [name] [birth year], MRN [id number]  PCP: [name], MD Primary Cardiologist: Marlou Porch Electrophysiologist: Caryl Comes  CC: Pacemaker follow-up  Kayla [name] is a 80 y.o. female seen today for Dr Caryl Comes.  She presents today for routine electrophysiology followup.  Since last being seen in our clinic, the patient reports doing reasonably well.   She has had increased shortness of breath and lower extremity edema (R>L). She is taking increased dose of Torsemide for the next 2 days (just started yesterday with some improvement in left LLE).  She denies chest pain, palpitations,  PND, orthopnea, nausea, vomiting, dizziness, syncope, weight gain, or early satiety.  Device History: MDT dual chamber PPM implanted 2017 for SSS   Past Medical History:  Diagnosis Date  . A-fib ([place])   . Allergy   . Anxiety   . Arthritis   . CAD (coronary artery disease)    Mild per remote cath in 2006, /    Nuclear, January, 2013, no significant abnormality,  . Carotid artery disease ([place])    Doppler, January, 2013, 0 62% RIC A., 70-35% LICA, stable  . Cataract   . CHF (congestive heart failure) ([place])   . Complication of anesthesia    wakes up "mean"  . COPD 02/16/2007   Intolerant of Spiriva, tudorza Intolerant of Trelegy   . COPD (chronic obstructive pulmonary disease) ([place])   . Coronary atherosclerosis 11/24/2008   Catheterization 2006, nonobstructive coronary disease   //   nuclear 2013, low risk    . Diabetes mellitus, type 2 ([place])   . Diverticula of colon   . DIVERTICULOSIS OF COLON 03/23/2007   Qualifier: Diagnosis of  By: Halford Chessman MD, Vineet    . Dizziness    occasional  . Dizzy spells 05/04/2011  . DM (diabetes mellitus) ([place]) 03/23/2007   Qualifier: Diagnosis of  By: Halford Chessman MD, Vineet    . Dyspnea    On exertion  . Ejection fraction   . Essential hypertension 02/16/2007   Qualifier: Diagnosis of  By: Rosana Hoes CMA, Tammy     . G E R D 03/23/2007   Qualifier: Diagnosis of  By: Halford Chessman MD, Vineet    . Gall bladder disease 04/28/2016  . Gastritis and gastroduodenitis   . GERD (gastroesophageal reflux disease)   . Goiter    hx of  . Headache(784.0)    migraine  . Hyperlipidemia   . Hypertension   . Hypothyroidism   . HYPOTHYROIDISM, POSTSURGICAL 06/04/2008   Qualifier: Diagnosis of  By: Loanne Drilling MD, Jacelyn Pi   . Left ventricular hypertrophy   . Leg edema    occasional swelling  . Leg swelling 12/09/2018  . Myofascial pain syndrome, cervical 01/06/2018  . Obesity   . OBESITY 11/24/2008   Qualifier: Diagnosis of  By: Sidney Ace    . OBSTRUCTIVE SLEEP APNEA 02/16/2007   Auto CPAP 09/03/13 to 10/02/13 >> used on 30 of 30 nights with average 6 hrs and 3 min.  Average AHI is 3.1 with median CPAP 5 cm H2O and 95 th percentile CPAP 6 cm H2O.    . Occipital neuralgia of right side 01/06/2018  . OSA (obstructive sleep apnea)    cpap setting of 12  . Pain in joint of right hip 01/21/2018  . Palpitations 01/21/2011   48 hour Holter, 2015, scattered PACs and PVCs.   . Pneumonia   . PONV (postoperative nausea and vomiting)    severe  after every surgery  . Pulmonary hypertension, secondary   . RHINITIS 07/21/2010       . RUQ abdominal pain   . S/P left TKA 07/27/2011  . Sinus node dysfunction ([place]) 11/08/2015  . Sleep apnea   . Spinal stenosis of cervical region 11/07/2014  . Urinary frequency 12/09/2018  . UTI (urinary tract infection) 08/15/2019   per pt report  . VENTRICULAR HYPERTROPHY, LEFT 11/24/2008   Qualifier: Diagnosis of  By: Sidney Ace     Past Surgical History:  Procedure Laterality Date  . ABDOMINAL HYSTERECTOMY     with appendectomy and colon polypectomy  . AMPUTATION TOE Left 12/15/2019   Procedure: AMPUTATION  LEFT GREAT TOE;  Surgeon: Evelina Bucy, DPM;  Location: [facility];  Service: Podiatry;  Laterality: Left;  . ANTERIOR CERVICAL DECOMP/DISCECTOMY FUSION N/A 11/07/2014   Procedure: Anterior Cervical  diskectomy and fusion Cervical four-five, Cervical five-six, Cervical six-seven;  Surgeon: Kary Kos, MD;  Location: [facility];  Service: Neurosurgery;  Laterality: N/A;  . APPENDECTOMY    . ARTERY BIOPSY Right 02/16/2017   Procedure: BIOPSY TEMPORAL ARTERY;  Surgeon: Katha Cabal, MD;  Location: [facility] ORS;  Service: Vascular;  Laterality: Right;  . ARTERY BIOPSY Left 04/16/2020   Procedure: BIOPSY TEMPORAL ARTERY;  Surgeon: Katha Cabal, MD;  Location: [facility] ORS;  Service: Vascular;  Laterality: Left;  LEFT TEMPLE  . BACK SURGERY    . CARDIAC CATHETERIZATION    . CATARACT EXTRACTION W/ INTRAOCULAR LENS  IMPLANT, BILATERAL Bilateral   . CHOLECYSTECTOMY N/A 04/29/2016   Procedure: LAPAROSCOPIC CHOLECYSTECTOMY WITH INTRAOPERATIVE CHOLANGIOGRAM;  Surgeon: Alphonsa Overall, MD;  Location: [facility];  Service: General;  Laterality: N/A;  . EP IMPLANTABLE DEVICE N/A 11/08/2015   MRI COMPATABLE -- Procedure: Pacemaker Implant;  Surgeon: Deboraha Sprang, MD;  Location: [facility];  Service: Cardiovascular;  Laterality: N/A;  . INSERT / REPLACE / REMOVE PACEMAKER  2017   Dr. Jens Som [facility])  . JOINT REPLACEMENT    . LUMBAR DISC SURGERY  1990's   x 2  . THYROIDECTOMY  2011  . TOTAL KNEE ARTHROPLASTY  07/27/2011   Procedure: TOTAL KNEE ARTHROPLASTY;  Surgeon: Mauri Pole, MD;  Location: [facility];  Service: Orthopedics;  Laterality: Left;  . UPPER ESOPHAGEAL ENDOSCOPIC ULTRASOUND (EUS) N/A 05/14/2016   Procedure: UPPER ESOPHAGEAL ENDOSCOPIC ULTRASOUND (EUS);  Surgeon: Milus Banister, MD;  Location: [facility];  Service: Endoscopy;  Laterality: N/A;  radial only-will be done mod if needed    Current Outpatient Medications  Medication Sig Dispense Refill  . acetaminophen (TYLENOL) 500 MG tablet Take 1,000 mg by mouth every 6 (six) hours as needed for moderate pain (for headaches).     Marland Kitchen albuterol (PROVENTIL) (2.5 MG/3ML) 0.083% nebulizer solution Take 3 mLs (2.5 mg total) by  nebulization every 2 (two) hours as needed for wheezing or shortness of breath. 75 mL 5  . albuterol (VENTOLIN HFA) 108 (90 Base) MCG/ACT inhaler Inhale 2 puffs into the lungs every 6 (six) hours as needed for wheezing or shortness of breath. 3 each 3  . Alcohol Swabs (B-D SINGLE USE SWABS REGULAR) PADS     . allopurinol (ZYLOPRIM) 100 MG tablet Take 100 mg by mouth 2 (two) times daily.     Marland Kitchen amiodarone (PACERONE) 200 MG tablet Take 400mg  - (2 tablets) by mouth twice daily x 2 weeks then reduce to 400mg  (2 tablet) by mouth once daily x 2 weeks 84 tablet 0  .  amiodarone (PACERONE) 200 MG tablet Take 1 tablet (200 mg total) by mouth daily. 90 tablet 3  . ascorbic acid (VITAMIN C) 500 MG tablet Take 500 mg by mouth every morning.    Marland Kitchen atorvastatin (LIPITOR) 40 MG tablet Take 1 tablet (40 mg total) by mouth daily. 90 tablet 3  . Blood Glucose Calibration (TRUE METRIX LEVEL 1) Low SOLN     . Budeson-Glycopyrrol-Formoterol (BREZTRI AEROSPHERE) 160-9-4.8 MCG/ACT AERO Inhale 2 puffs into the lungs in the morning and at bedtime. 32.1 g 2  . cycloSPORINE (RESTASIS) 0.05 % ophthalmic emulsion Place 1 drop into both eyes 2 (two) times daily.    Marland Kitchen Dextromethorphan-guaiFENesin (TUSSIN DM MAX SUGAR-FREE PO) Take 5 mLs by mouth every 4 (four) hours as needed (for coughing).    Marland Kitchen ELIQUIS 5 MG TABS tablet TAKE 1 TABLET TWICE DAILY 180 tablet 1  . ferrous sulfate 325 (65 FE) MG tablet Take 1 tablet (325 mg total) by mouth daily with breakfast. 90 tablet 3  . fluticasone (FLONASE) 50 MCG/ACT nasal spray USE 2 SPRAYS NASALLY DAILY 48 g 1  . HYDROcodone-acetaminophen (NORCO) 5-325 MG tablet Take 1 tablet by mouth every 6 (six) hours as needed for moderate pain or severe pain. 15 tablet 0  . insulin aspart (NOVOLOG) 100 UNIT/ML FlexPen Inject 10-17 Units into the skin See admin instructions. Inject 30-70 units into the skin three times a day with meals, per sliding scale (and an additional 1 unit for every 50 points for a  BGL >130) Sliding scale    . insulin glargine (LANTUS) 100 UNIT/ML injection Inject 130 Units into the skin in the morning and at bedtime.    Marland Kitchen levothyroxine (SYNTHROID) 112 MCG tablet     . levothyroxine (SYNTHROID) 200 MCG tablet Take 175 mcg by mouth daily before breakfast.     . LORazepam (ATIVAN) 2 MG tablet Take 2 mg by mouth at bedtime as needed for anxiety.     . magnesium oxide (MAG-OX) 400 (241.3 Mg) MG tablet Take 400 mg by mouth 3 (three) times daily.     . nitroGLYCERIN (NITROSTAT) 0.4 MG SL tablet Place 1 tablet (0.4 mg total) under the tongue every 5 (five) minutes as needed for chest pain. 25 tablet 4  . predniSONE (DELTASONE) 10 MG tablet Take 10 mg by mouth daily.    . pregabalin (LYRICA) 200 MG capsule     . PRESCRIPTION MEDICATION CPAP: At bedtime and "as needed for shortness of breath"    . tobramycin (TOBREX) 0.3 % ophthalmic solution     . torsemide (DEMADEX) 20 MG tablet Take 3 tablets (60 mg total) by mouth every other day. 135 tablet 3  . TRUE METRIX BLOOD GLUCOSE TEST test strip     . TRUEplus Lancets 28G MISC     . vitamin B-12 (CYANOCOBALAMIN) 500 MCG tablet Take 500 mcg by mouth every morning.    . Vitamin D3 (VITAMIN D) 25 MCG tablet Take 1,000 Units by mouth every morning.    Marland Kitchen losartan (COZAAR) 50 MG tablet Take 1 tablet (50 mg total) by mouth daily. 90 tablet 3  . metoprolol succinate (TOPROL-XL) 100 MG 24 hr tablet Take 1 tablet (100 mg total) by mouth daily. Take with or immediately following a meal. 90 tablet 3   No current facility-administered medications for this visit.    Allergies:   Dulaglutide   Social History: Social History   Socioeconomic History  . Marital status: Married    Spouse name:  James  . Number of children: 3  . Years of education: 70  . Highest education level: Not on file  Occupational History  . Occupation: retired  Tobacco Use  . Smoking status: Former Smoker    Packs/day: 2.00    Years: 33.00    Pack years: 66.00     Types: Cigarettes    Start date: 1966    Quit date: 05/04/1997    Years since quitting: 23.4  . Smokeless tobacco: Never Used  Vaping Use  . Vaping Use: Never used  Substance and Sexual Activity  . Alcohol use: No  . Drug use: No  . Sexual activity: Not on file  Other Topics Concern  . Not on file  Social History Narrative   Patient lives at home with her husband Jeneen Rinks .   Retired.   Education 12 th grade.   Right handed   Caffeine two cups of coffee.   Social Determinants of Health   Financial Resource Strain: Not on file  Food Insecurity: Not on file  Transportation Needs: Not on file  Physical Activity: Not on file  Stress: Not on file  Social Connections: Not on file  Intimate Partner Violence: Not on file    Family History: Family History  Problem Relation Age of Onset  . Heart Problems Mother   . Heart defect Mother        valve problems  . Heart Problems Father   . Heart disease Brother   . Lung cancer Daughter        non small cell   . Diabetes Maternal Aunt   . Diabetes Maternal Uncle   . Throat cancer Brother   . Colon cancer Neg Hx   . Colon polyps Neg Hx   . Esophageal cancer Neg Hx   . Rectal cancer Neg Hx   . Stomach cancer Neg Hx      Review of Systems: All other systems reviewed and are otherwise negative except as noted above.   Physical Exam: VS:  BP (!) 148/60   Pulse 81   Ht 5\' 6"  (1.676 m)   Wt 258 lb 12.8 oz (117.4 kg)   SpO2 94%   BMI 41.77 kg/m  , BMI Body mass index is 41.77 kg/m.  GEN- The patient is elderly appearing, alert and oriented x 3 today.   HEENT: normocephalic, atraumatic; sclera clear, conjunctiva pink; hearing intact; oropharynx clear; neck supple  Lungs- Clear to ausculation bilaterally, normal work of breathing.  No wheezes, rales, rhonchi Heart- Regular rate and rhythm  GI- soft, non-tender, non-distended, bowel sounds present  Extremities- no clubbing, cyanosis, `+ Left LLE, + redness MS- no significant  deformity or atrophy Skin- warm and dry, no rash or lesion; PPM pocket well healed Psych- euthymic mood, full affect Neuro- strength and sensation are intact  PPM Interrogation- reviewed in detail today,  See PACEART report  EKG:  EKG is not ordered today.  Recent Labs: 10/03/2019: B Natriuretic Peptide 69.3 12/12/2019: Magnesium 2.0 04/16/2020: Hemoglobin 12.0; Platelets 384 08/20/2020: ALT 17; BUN 41; Creatinine, Ser 1.78; Potassium 5.2; Sodium 140; TSH 0.740   Wt Readings from Last 3 Encounters:  09/24/20 258 lb 12.8 oz (117.4 kg)  07/09/20 241 lb (109.3 kg)  05/15/20 240 lb 3.2 oz (109 kg)     Other studies Reviewed: Additional studies/ records that were reviewed today include:Dr Klein's office notes  Assessment and Plan:  1.  Sick sinus syndrome Normal PPM function See Claudia Desanctis Art report No  changes today  2.  Persistent atrial fibrillation Burden by device interrogation 4.5% She is tolerating amiodarone well - continue 200mg  daily  Will check survellience labs today Continue Eliquis for CHADS2VASC of 5  3.  HTN Stable No change required today  4.  Cellulitis Likely left lower extremity cellulitis Will give Keflex for 7 days (renally dosed) - advised to follow up with PCP in 1 week to make sure resolved  5.  Shortness of breath Will update echo Labs today Continue increased dose of Torsemide for the next 2 days      Current medicines are reviewed at length with the patient today.   The patient does not have concerns regarding her medicines.  The following changes were made today:  As above   Labs/ tests ordered today include:   No orders of the defined types were placed in this encounter.    Disposition:   Follow up with Jonni Sanger in 4 weeks   Signed, Chanetta Marshall, NP 09/24/2020 [place] [address] [phone] (office) [phone] (fax)

## 2020-09-24 NOTE — Patient Instructions (Addendum)
Medication Instructions:  Your physician has recommended you make the following change in your medication:   1. Begin cephalexin (Keflex) 250mg  capsule, one capsule every 12 hours. Take with food. Take your antibiotic until they are completely gone.    Labwork: You will have labs drawn today: CMP, Mg, Pro BNP   Testing/Procedures: Your physician has requested that you have an echocardiogram. Echocardiography is a painless test that uses sound waves to create images of your heart. It provides your doctor with information about the size and shape of your heart and how well your heart's chambers and valves are working. This procedure takes approximately one hour. There are no restrictions for this procedure.    Follow-Up: Your physician recommends that you schedule a follow-up appointment in:   Please call your primary care doctor for a check up on your leg within the next 10 days.   Dr Caryl Comes On Sept 8 @ 1:45pm   Any Other Special Instructions Will Be Listed Below (If Applicable).     If you need a refill on your cardiac medications before your next appointment, please call your pharmacy.

## 2020-09-25 LAB — COMPREHENSIVE METABOLIC PANEL
ALT: 18 IU/L (ref 0–32)
AST: 18 IU/L (ref 0–40)
Albumin/Globulin Ratio: 1.4 (ref 1.2–2.2)
Albumin: 3.6 g/dL — ABNORMAL LOW (ref 3.7–4.7)
Alkaline Phosphatase: 68 IU/L (ref 44–121)
BUN/Creatinine Ratio: 22 (ref 12–28)
BUN: 47 mg/dL — ABNORMAL HIGH (ref 8–27)
Bilirubin Total: <0.2 mg/dL (ref 0.0–1.2)
CO2: 18 mmol/L — ABNORMAL LOW (ref 20–29)
Calcium: 9.3 mg/dL (ref 8.7–10.3)
Chloride: 102 mmol/L (ref 96–106)
Creatinine, Ser: 2.15 mg/dL — ABNORMAL HIGH (ref 0.57–1.00)
Globulin, Total: 2.6 g/dL (ref 1.5–4.5)
Glucose: 274 mg/dL — ABNORMAL HIGH (ref 65–99)
Potassium: 5 mmol/L (ref 3.5–5.2)
Sodium: 142 mmol/L (ref 134–144)
Total Protein: 6.2 g/dL (ref 6.0–8.5)
eGFR: 23 mL/min/{1.73_m2} — ABNORMAL LOW (ref >59)

## 2020-09-25 LAB — MAGNESIUM: Magnesium: 2 mg/dL (ref 1.6–2.3)

## 2020-09-25 LAB — TSH: TSH: 3.41 u[IU]/mL (ref 0.450–4.500)

## 2020-09-25 LAB — PRO B NATRIURETIC PEPTIDE: NT-Pro BNP: 562 pg/mL (ref 0–738)

## 2020-09-27 DIAGNOSIS — J9601 Acute respiratory failure with hypoxia: Secondary | ICD-10-CM | POA: Diagnosis not present

## 2020-09-27 DIAGNOSIS — I5033 Acute on chronic diastolic (congestive) heart failure: Secondary | ICD-10-CM | POA: Diagnosis not present

## 2020-09-27 DIAGNOSIS — N184 Chronic kidney disease, stage 4 (severe): Secondary | ICD-10-CM | POA: Diagnosis not present

## 2020-09-27 DIAGNOSIS — Z20822 Contact with and (suspected) exposure to covid-19: Secondary | ICD-10-CM | POA: Diagnosis not present

## 2020-09-27 DIAGNOSIS — I130 Hypertensive heart and chronic kidney disease with heart failure and stage 1 through stage 4 chronic kidney disease, or unspecified chronic kidney disease: Secondary | ICD-10-CM | POA: Diagnosis not present

## 2020-09-27 DIAGNOSIS — J441 Chronic obstructive pulmonary disease with (acute) exacerbation: Secondary | ICD-10-CM | POA: Diagnosis not present

## 2020-09-27 DIAGNOSIS — R0602 Shortness of breath: Secondary | ICD-10-CM | POA: Diagnosis not present

## 2020-09-27 DIAGNOSIS — N179 Acute kidney failure, unspecified: Secondary | ICD-10-CM | POA: Diagnosis not present

## 2020-09-27 DIAGNOSIS — I517 Cardiomegaly: Secondary | ICD-10-CM | POA: Diagnosis not present

## 2020-09-27 DIAGNOSIS — I10 Essential (primary) hypertension: Secondary | ICD-10-CM | POA: Diagnosis not present

## 2020-09-27 DIAGNOSIS — R0902 Hypoxemia: Secondary | ICD-10-CM | POA: Diagnosis not present

## 2020-09-27 DIAGNOSIS — I480 Paroxysmal atrial fibrillation: Secondary | ICD-10-CM | POA: Diagnosis not present

## 2020-09-27 DIAGNOSIS — L03115 Cellulitis of right lower limb: Secondary | ICD-10-CM | POA: Diagnosis not present

## 2020-09-27 DIAGNOSIS — R600 Localized edema: Secondary | ICD-10-CM | POA: Diagnosis not present

## 2020-09-27 DIAGNOSIS — M7989 Other specified soft tissue disorders: Secondary | ICD-10-CM | POA: Diagnosis not present

## 2020-09-27 DIAGNOSIS — E119 Type 2 diabetes mellitus without complications: Secondary | ICD-10-CM | POA: Diagnosis not present

## 2020-09-27 DIAGNOSIS — R0689 Other abnormalities of breathing: Secondary | ICD-10-CM | POA: Diagnosis not present

## 2020-09-27 DIAGNOSIS — Z6841 Body Mass Index (BMI) 40.0 and over, adult: Secondary | ICD-10-CM | POA: Diagnosis not present

## 2020-10-01 ENCOUNTER — Ambulatory Visit: Payer: Medicare HMO | Admitting: Podiatry

## 2020-10-02 DIAGNOSIS — J441 Chronic obstructive pulmonary disease with (acute) exacerbation: Secondary | ICD-10-CM | POA: Diagnosis not present

## 2020-10-02 DIAGNOSIS — I509 Heart failure, unspecified: Secondary | ICD-10-CM | POA: Diagnosis not present

## 2020-10-02 DIAGNOSIS — Z6841 Body Mass Index (BMI) 40.0 and over, adult: Secondary | ICD-10-CM | POA: Diagnosis not present

## 2020-10-02 DIAGNOSIS — F1190 Opioid use, unspecified, uncomplicated: Secondary | ICD-10-CM | POA: Diagnosis not present

## 2020-10-02 DIAGNOSIS — N1832 Chronic kidney disease, stage 3b: Secondary | ICD-10-CM | POA: Diagnosis not present

## 2020-10-03 ENCOUNTER — Other Ambulatory Visit: Payer: Self-pay

## 2020-10-03 ENCOUNTER — Ambulatory Visit (HOSPITAL_COMMUNITY)
Admission: RE | Admit: 2020-10-03 | Discharge: 2020-10-03 | Disposition: A | Discharge status: Home / Self Care | Payer: Medicare HMO | Source: Ambulatory Visit | Attending: Student | Admitting: Student

## 2020-10-03 DIAGNOSIS — I2510 Atherosclerotic heart disease of native coronary artery without angina pectoris: Secondary | ICD-10-CM | POA: Insufficient documentation

## 2020-10-03 DIAGNOSIS — E785 Hyperlipidemia, unspecified: Secondary | ICD-10-CM | POA: Diagnosis not present

## 2020-10-03 DIAGNOSIS — I480 Paroxysmal atrial fibrillation: Secondary | ICD-10-CM

## 2020-10-03 DIAGNOSIS — E119 Type 2 diabetes mellitus without complications: Secondary | ICD-10-CM | POA: Diagnosis not present

## 2020-10-03 DIAGNOSIS — R0602 Shortness of breath: Secondary | ICD-10-CM

## 2020-10-03 DIAGNOSIS — J449 Chronic obstructive pulmonary disease, unspecified: Secondary | ICD-10-CM | POA: Diagnosis not present

## 2020-10-03 DIAGNOSIS — I10 Essential (primary) hypertension: Secondary | ICD-10-CM | POA: Insufficient documentation

## 2020-10-03 DIAGNOSIS — I4891 Unspecified atrial fibrillation: Secondary | ICD-10-CM | POA: Insufficient documentation

## 2020-10-03 LAB — ECHOCARDIOGRAM COMPLETE
Area-P 1/2: 3.08 cm2
S' Lateral: 2.7 cm

## 2020-10-03 NOTE — Progress Notes (Signed)
  Echocardiogram 2D Echocardiogram has been performed.  Jennette Dubin 10/03/2020, 10:53 AM

## 2020-10-07 ENCOUNTER — Encounter: Payer: Medicare HMO | Admitting: Student

## 2020-10-08 DIAGNOSIS — H26493 Other secondary cataract, bilateral: Secondary | ICD-10-CM | POA: Diagnosis not present

## 2020-10-08 DIAGNOSIS — E113213 Type 2 diabetes mellitus with mild nonproliferative diabetic retinopathy with macular edema, bilateral: Secondary | ICD-10-CM | POA: Diagnosis not present

## 2020-10-08 DIAGNOSIS — H353132 Nonexudative age-related macular degeneration, bilateral, intermediate dry stage: Secondary | ICD-10-CM | POA: Diagnosis not present

## 2020-10-08 DIAGNOSIS — H35033 Hypertensive retinopathy, bilateral: Secondary | ICD-10-CM | POA: Diagnosis not present

## 2020-10-10 ENCOUNTER — Other Ambulatory Visit (HOSPITAL_COMMUNITY): Payer: Medicare HMO

## 2020-10-11 ENCOUNTER — Telehealth: Payer: Self-pay | Admitting: Pulmonary Disease

## 2020-10-11 ENCOUNTER — Telehealth: Payer: Self-pay | Admitting: *Deleted

## 2020-10-11 ENCOUNTER — Ambulatory Visit: Payer: Medicare HMO | Admitting: Primary Care

## 2020-10-11 ENCOUNTER — Other Ambulatory Visit: Payer: Self-pay | Admitting: *Deleted

## 2020-10-11 MED ORDER — PREDNISONE 10 MG (21) PO TBPK: ORAL_TABLET | Freq: Every day | ORAL | 0 refills | Status: DC

## 2020-10-11 MED ORDER — PREDNISONE 10 MG PO TABS: ORAL_TABLET | ORAL | 0 refills | Status: DC

## 2020-10-11 MED ORDER — AZITHROMYCIN 250 MG PO TABS: ORAL_TABLET | ORAL | 0 refills | Status: DC

## 2020-10-11 NOTE — Telephone Encounter (Signed)
Called and spoke with patient, advised of recommendations per Dr. Halford Chessman.  She states she was not sent home with any prednisone.  She verbalized understanding of all other instructions per Dr. Halford Chessman.  Patient scheduled to see Beth on Monday at 10:30 am, advised to arrive by 10:15 am.  Advised I would make Dr. Halford Chessman aware that she does not have any prednisone to take and see if he wants [place] to send any in to her pharmacy.  [facility] in [place].  Dr. Halford Chessman, Patient states she does not have any prednisone to take and was not sent home with any prednisone.  Do you want [place] to send some in.  Please advise.  Thank you.

## 2020-10-11 NOTE — Telephone Encounter (Signed)
Called and left VM for patient letting her know that her appointment is actually 10/16/20 (Wednesday of next week) and not 10/11/20 (Monday of next week) as previously discussed.  Apologized for confusion.  Will await return call.

## 2020-10-11 NOTE — Telephone Encounter (Signed)
Primary Pulmonologist: Dr Halford Chessman Last office visit and with whom: 05/15/20-Dr Halford Chessman What do we see them for (pulmonary problems): COPD w/ chronic bronchitis Last OV assessment/plan:  Instructions   Return in about 3 months (around 08/13/2020). Try using breztri two puffs in the morning and two puffs in the evening, and rinse your mouth after each use   Will arrange for a spacer device to use with your inhalers   Stop using spiriva and symbicort while using breztri   Call in two weeks to update your status after changing to breztri   Follow up in 3 months         Reason for call:  Called and spoke with Patient.  Patient stated she has had increased cough for 1 week.  Patient stated cough is mostly nonproductive,with occasional yellow sputum.  Patient stated he chest feels very congestion and feels phlegm stuck in her throat. Patient stated coughing spells causes increased sob.  Patient denies any fever, chills.  Patient stated she has been taking Mucinex using albuterol inhaler 3-5 times a day, albuterol nebs, and Breztri 2 puffs, twice daily.  Patient stated she is still taking lose Prednisone, but it is not enough to help. Patient request any prescriptions to go to [place].   Message routed to Dr Halford Chessman to advise    Allergies  Allergen Reactions   Dulaglutide Nausea Only, Other (See Comments) and Cough    Made the patient sick to her stomach (only) several days after using it Trulicity    Immunization History  Administered Date(s) Administered   Fluad Quad(high Dose 65+) 01/04/2019   Influenza Split 03/05/2011, 01/08/2017, 02/10/2018   Influenza Whole 03/18/2009, 02/03/2010, 04/03/2012   Influenza, High Dose Seasonal PF 02/04/2016, 02/23/2017, 01/03/2020   Influenza,inj,Quad PF,6+ Mos 02/10/2013, 03/04/2014, 03/17/2015   Influenza,inj,quad, With Preservative 01/20/2018   Moderna Sars-Covid-2 Vaccination 05/17/2019, 06/13/2019, 03/05/2020   Pneumococcal Polysaccharide-23  02/03/2010   Tdap 03/04/2014   Zoster, Live 10/02/2013

## 2020-10-11 NOTE — Progress Notes (Signed)
Remote pacemaker transmission.   

## 2020-10-11 NOTE — Telephone Encounter (Signed)
I called and spoke with patient regarding Kayla [name] recs. She verbalized understanding and I sent in pred taper to preferred pharmacy and to monitor blood sugars and call if above 250. Patient also to call or go to ER if symptoms worse. Patient verbalized understanding. Nothing further needed.

## 2020-10-11 NOTE — Telephone Encounter (Signed)
Looks like she was admitted to [facility] from 09/27/20 to 09/29/20 for COPD exacerbation.  She was treated with solumedrol, doxycycline and rocephin.  She was switched to prednisone taper.  Doesn't appear that she was discharged home with antibiotics.  Chest xray then didn't show evidence for pneumonia.    Please send script for Zpak.  She should finish prednisone taper from [facility].  She should use her albuterol nebulizer every 8 hours through the weekend.  She should continue using breztri two puffs bid.  She needs an in office visit with NP next week in [place] office.

## 2020-10-11 NOTE — Telephone Encounter (Signed)
Called and spoke with patient, advised of recommendations per Dr. Halford Chessman.  She states she was not sent home with any prednisone.  She verbalized understanding of all other instructions per Dr. Halford Chessman.  Patient scheduled to see Beth on Monday at 10:30 am, advised to arrive by 10:15 am.  Advised I would make Dr. Halford Chessman aware that she does not have any prednisone to take and see if he wants [place] to send any in to her pharmacy.  [facility] in [place].   Tammy, Patient states she does not have any prednisone to take and was not sent home with any prednisone.  Do you want [place] to send some in.  Please advise.  Thank

## 2020-10-11 NOTE — Telephone Encounter (Signed)
According to the care everywhere she was supposed to be sent home prednisone. She has an appointment on June 15. Can send and prednisone 20 mg for 3 days then 10 mg for 3 days and stop.  We can discuss at her follow-up visit if any further prednisone is indicated.  Patient does have diabetes have her call if her blood sugars are greater than 250.  Please contact office for sooner follow up if symptoms do not improve or worsen or seek emergency care

## 2020-10-11 NOTE — Addendum Note (Signed)
Addended by: Valerie Salts on: 10/11/2020 05:30 PM   Modules accepted: Orders

## 2020-10-16 ENCOUNTER — Other Ambulatory Visit: Payer: Self-pay

## 2020-10-16 ENCOUNTER — Ambulatory Visit (INDEPENDENT_AMBULATORY_CARE_PROVIDER_SITE_OTHER): Payer: Medicare HMO

## 2020-10-16 ENCOUNTER — Encounter: Payer: Self-pay | Admitting: Adult Health

## 2020-10-16 ENCOUNTER — Ambulatory Visit (INDEPENDENT_AMBULATORY_CARE_PROVIDER_SITE_OTHER): Payer: Medicare HMO | Admitting: Adult Health

## 2020-10-16 VITALS — BP 130/70 | HR 90 | Temp 98.5°F | Ht 66.0 in | Wt 260.0 lb

## 2020-10-16 DIAGNOSIS — J449 Chronic obstructive pulmonary disease, unspecified: Secondary | ICD-10-CM

## 2020-10-16 DIAGNOSIS — J441 Chronic obstructive pulmonary disease with (acute) exacerbation: Secondary | ICD-10-CM | POA: Diagnosis not present

## 2020-10-16 DIAGNOSIS — I5032 Chronic diastolic (congestive) heart failure: Secondary | ICD-10-CM | POA: Diagnosis not present

## 2020-10-16 DIAGNOSIS — I5030 Unspecified diastolic (congestive) heart failure: Secondary | ICD-10-CM | POA: Insufficient documentation

## 2020-10-16 DIAGNOSIS — R059 Cough, unspecified: Secondary | ICD-10-CM | POA: Diagnosis not present

## 2020-10-16 DIAGNOSIS — G4733 Obstructive sleep apnea (adult) (pediatric): Secondary | ICD-10-CM

## 2020-10-16 MED ORDER — BENZONATATE 200 MG PO CAPS: 200.0000 mg | ORAL_CAPSULE | Freq: Three times a day (TID) | ORAL | 1 refills | Status: DC | PRN

## 2020-10-16 MED ORDER — METHYLPREDNISOLONE ACETATE 80 MG/ML IJ SUSP: 120.0000 mg | Freq: Once | INTRAMUSCULAR | Status: DC

## 2020-10-16 MED ORDER — PREDNISONE 20 MG PO TABS: 20.0000 mg | ORAL_TABLET | Freq: Every day | ORAL | 0 refills | Status: DC

## 2020-10-16 NOTE — Progress Notes (Signed)
Reviewed and agree with assessment/plan.   Chesley Mires, MD [facility] Pulmonary/Critical Care 10/16/2020, 12:44 PM Pager:  [phone]

## 2020-10-16 NOTE — Assessment & Plan Note (Signed)
Appears euvolemic on exam.  Continue follow-up with cardiology.  Recent echo showed grade 2 diastolic dysfunction.  Patient has underlying chronic kidney disease .  Continue current dose of diuretics.

## 2020-10-16 NOTE — Patient Instructions (Addendum)
Depo Medrol shot today  Prednisone 20mg  daily for 5 days - start tomorrow .  Mucinex DM Twice daily  As needed  cough/congestion  Tessalon Three times a day  As needed  cough .  Continue on BREZTRI 2 puffs Twice daily   Albuterol inhaler /Neb As needed   Chest xray today  Sputum culture today .  Follow up with Dr. Halford Chessman  in 4-6 weeks and As needed   Please contact office for sooner follow up if symptoms do not improve or worsen or seek emergency care

## 2020-10-16 NOTE — Assessment & Plan Note (Addendum)
Slow to resolve COPD exacerbation.  Chest x-ray today shows no acute process. Check sputum culture.  Hold on additional antibiotics as she has completed 2 courses of antibiotics.  We will rechallenge with short prednisone taper.  And cough control regimen  Patient is on amiodarone chest x-ray today shows no acute process.  However if symptoms or not improving will need to consider further work-up if cough persist  Plan  Patient Instructions  Depo Medrol shot today  Prednisone 20mg  daily for 5 days - start tomorrow .  Mucinex DM Twice daily  As needed  cough/congestion  Tessalon Three times a day  As needed  cough .  Continue on BREZTRI 2 puffs Twice daily   Albuterol inhaler /Neb As needed   Chest xray today  Sputum culture today .  Follow up with Dr. Halford Chessman  in 4-6 weeks and As needed   Please contact office for sooner follow up if symptoms do not improve or worsen or seek emergency care

## 2020-10-16 NOTE — Progress Notes (Signed)
@Patient  ID: Kayla [name], female    DOB: 05-13-1940, 80 y.o.   MRN: [id number]  Chief Complaint  Patient presents with   Acute Visit     Referring provider: [name], MD  HPI: 80 year old female former smoker followed for COPD and obstructive sleep apnea Medical history significant for diabetes, chronic kidney disease  TEST/EVENTS :  Spirometry 10/19/08 >> FEV1 1.55 (68%), FEV1% 70 Spirometry 08/29/12 >> FEV1 1.38 (59%), FEV1% 58 FeNO 03/09/16 >> 7   PSG 02/2005 >> AHI 10 CPAP titration 02/19/14 >> CPAP 11 cm H2O >> AHI 0.7, PLMI 81.4, did not need supplemental oxygen  Echo 08/13/19 >> EF 65 to 70%, mod LVH, mod RA dilation  10/16/2020 Acute OV :  COPD and OSA  Patient presents for an acute office visit.  She complains over the last 3 weeks that she has had increased cough congestion wheezing.  She was [place] vacation at the beach.  And started having increased shortness of breath and coughing.  Care everywhere notes were reviewed from the emergency room visit on Sep 27, 2020.  Chest x-ray showed no acute pulmonary process.  Venous Doppler was negative for DVT.  Echo 2D echo showed EF at 65 to 45%, diastolic dysfunction.  Mild LVH.  Right ventricular systolic pressure normal. Patient was given Keflex x1 week.  Patient had ongoing symptoms and called in the office on June 10.  She was called in a Z-Pak and prednisone 20 mg for 5 days.  Patient says she is only had minimal improvement in symptoms continues to have a barking cough that is keeping her up at night.  She continues to have intermittent wheezing and congestion.  She denies any hemoptysis, chest pain, orthopnea or increased leg swelling.  Patient does have diastolic heart failure and is on Demadex.  She also has chronic kidney disease.  She denies any orthopnea or weight gain.  Patient is on amiodarone. She remains on BREZTRI twice daily. Patient is on CPAP at bedtime.  Says that she wears her CPAP each night.  She  denies any significant daytime sleepiness.  CPAP download shows excellent compliance with daily average usage at 10 hours.  Patient is on AutoSet 5 to 15 cm H2O.  AHI 1.2.   Allergies  Allergen Reactions   Dulaglutide Nausea Only, Other (See Comments) and Cough    Made the patient sick to her stomach (only) several days after using it Trulicity    Immunization History  Administered Date(s) Administered   Fluad Quad(high Dose 65+) 01/04/2019   Influenza Split 03/05/2011, 01/08/2017, 02/10/2018   Influenza Whole 03/18/2009, 02/03/2010, 04/03/2012   Influenza, High Dose Seasonal PF 02/04/2016, 02/23/2017, 01/03/2020   Influenza,inj,Quad PF,6+ Mos 02/10/2013, 03/04/2014, 03/17/2015   Influenza,inj,quad, With Preservative 01/20/2018   Moderna Sars-Covid-2 Vaccination 05/17/2019, 06/13/2019, 03/05/2020   Pneumococcal Polysaccharide-23 02/03/2010   Tdap 03/04/2014   Zoster, Live 10/02/2013    Past Medical History:  Diagnosis Date   A-fib ([place])    Allergy    Anxiety    Arthritis    CAD (coronary artery disease)    Mild per remote cath in 2006, /    Nuclear, January, 2013, no significant abnormality,   Carotid artery disease ([place])    Doppler, January, 2013, 0 62% RIC A., 56-38% LICA, stable   Cataract    CHF (congestive heart failure) ([place])    Complication of anesthesia    wakes up "mean"   COPD 02/16/2007   Intolerant of Spiriva, [place]  Intolerant of Trelegy    COPD (chronic obstructive pulmonary disease) ([place])    Coronary atherosclerosis 11/24/2008   Catheterization 2006, nonobstructive coronary disease   //   nuclear 2013, low risk     Diabetes mellitus, type 2 ([place])    Diverticula of colon    DIVERTICULOSIS OF COLON 03/23/2007   Qualifier: Diagnosis of  By: Halford Chessman MD, [place]     Dizziness    occasional   Dizzy spells 05/04/2011   DM (diabetes mellitus) ([place]) 03/23/2007   Qualifier: Diagnosis of  By: Halford Chessman MD, Vine[place]     Dyspnea    On exertion   Ejection fraction     Essential hypertension 02/16/2007   Qualifier: Diagnosis of  By: Rosana Hoes CMA, Aunesti Pellegrino     G E R D 03/23/2007   Qualifier: Diagnosis of  By: Halford Chessman MD, Vine[place]     Gall bladder disease 04/28/2016   Gastritis and gastroduodenitis    GERD (gastroesophageal reflux disease)    Goiter    hx of   Headache(784.0)    migraine   Hyperlipidemia    Hypertension    Hypothyroidism    HYPOTHYROIDISM, POSTSURGICAL 06/04/2008   Qualifier: Diagnosis of  By: Loanne Drilling MD, Sean A    Left ventricular hypertrophy    Leg edema    occasional swelling   Leg swelling 12/09/2018   Myofascial pain syndrome, cervical 01/06/2018   Obesity    OBESITY 11/24/2008   Qualifier: Diagnosis of  By: Jaramillo, [place] 02/16/2007   Auto CPAP 09/03/13 to 10/02/13 >> used on 30 of 30 nights with average 6 hrs and 3 min.  Average AHI is 3.1 with median CPAP 5 cm H2O and 95 th percentile CPAP 6 cm H2O.     Occipital neuralgia of right side 01/06/2018   OSA (obstructive sleep apnea)    cpap setting of 12   Pain in joint of right hip 01/21/2018   Palpitations 01/21/2011   48 hour Holter, 2015, scattered PACs and PVCs.    Pneumonia    PONV (postoperative nausea and vomiting)    severe after every surgery   Pulmonary hypertension, secondary    RHINITIS 07/21/2010        RUQ abdominal pain    S/P left TKA 07/27/2011   Sinus node dysfunction (HCC) 11/08/2015   Sleep apnea    Spinal stenosis of cervical region 11/07/2014   Urinary frequency 12/09/2018   UTI (urinary tract infection) 08/15/2019   per pt report   VENTRICULAR HYPERTROPHY, LEFT 11/24/2008   Qualifier: Diagnosis of  By: Sidney Ace      Tobacco History: Social History   Tobacco Use  Smoking Status Former   Packs/day: 2.00   Years: 33.00   Pack years: 66.00   Types: Cigarettes   Start date: 1966   Quit date: 05/04/1997   Years since quitting: 23.4  Smokeless Tobacco Never   Counseling given: Not Answered   Outpatient Medications Prior to Visit   Medication Sig Dispense Refill   acetaminophen (TYLENOL) 500 MG tablet Take 1,000 mg by mouth every 6 (six) hours as needed for moderate pain (for headaches).      albuterol (PROVENTIL) (2.5 MG/3ML) 0.083% nebulizer solution Take 3 mLs (2.5 mg total) by nebulization every 2 (two) hours as needed for wheezing or shortness of breath. 75 mL 5   albuterol (VENTOLIN HFA) 108 (90 Base) MCG/ACT inhaler Inhale 2 puffs into the lungs every 6 (six) hours as  needed for wheezing or shortness of breath. 3 each 3   Alcohol Swabs (B-D SINGLE USE SWABS REGULAR) PADS      allopurinol (ZYLOPRIM) 100 MG tablet Take 100 mg by mouth 2 (two) times daily.      amiodarone (PACERONE) 200 MG tablet Take 400mg  - (2 tablets) by mouth twice daily x 2 weeks then reduce to 400mg  (2 tablet) by mouth once daily x 2 weeks 84 tablet 0   amiodarone (PACERONE) 200 MG tablet Take 1 tablet (200 mg total) by mouth daily. 90 tablet 3   ascorbic acid (VITAMIN C) 500 MG tablet Take 500 mg by mouth every morning.     atorvastatin (LIPITOR) 40 MG tablet Take 1 tablet (40 mg total) by mouth daily. 90 tablet 3   azithromycin (ZITHROMAX) 250 MG tablet Take 2 tablets today and then 1 tablet daily until gone. 6 tablet 0   Blood Glucose Calibration (TRUE METRIX LEVEL 1) Low SOLN      Budeson-Glycopyrrol-Formoterol (BREZTRI AEROSPHERE) 160-9-4.8 MCG/ACT AERO Inhale 2 puffs into the lungs in the morning and at bedtime. 32.1 g 2   cycloSPORINE (RESTASIS) 0.05 % ophthalmic emulsion Place 1 drop into both eyes 2 (two) times daily.     Dextromethorphan-guaiFENesin (TUSSIN DM MAX SUGAR-FREE PO) Take 5 mLs by mouth every 4 (four) hours as needed (for coughing).     ELIQUIS 5 MG TABS tablet TAKE 1 TABLET TWICE DAILY 180 tablet 1   ferrous sulfate 325 (65 FE) MG tablet Take 1 tablet (325 mg total) by mouth daily with breakfast. 90 tablet 3   fluticasone (FLONASE) 50 MCG/ACT nasal spray USE 2 SPRAYS NASALLY DAILY 48 g 1   HYDROcodone-acetaminophen (NORCO)  5-325 MG tablet Take 1 tablet by mouth every 6 (six) hours as needed for moderate pain or severe pain. 15 tablet 0   insulin aspart (NOVOLOG) 100 UNIT/ML FlexPen Inject 10-17 Units into the skin See admin instructions. Inject 30-70 units into the skin three times a day with meals, per sliding scale (and an additional 1 unit for every 50 points for a BGL >130) Sliding scale     insulin glargine (LANTUS) 100 UNIT/ML injection Inject 130 Units into the skin in the morning and at bedtime.     levothyroxine (SYNTHROID) 112 MCG tablet      levothyroxine (SYNTHROID) 200 MCG tablet Take 175 mcg by mouth daily before breakfast.      LORazepam (ATIVAN) 2 MG tablet Take 2 mg by mouth at bedtime as needed for anxiety.      magnesium oxide (MAG-OX) 400 (241.3 Mg) MG tablet Take 400 mg by mouth 3 (three) times daily.      nitroGLYCERIN (NITROSTAT) 0.4 MG SL tablet Place 1 tablet (0.4 mg total) under the tongue every 5 (five) minutes as needed for chest pain. 25 tablet 4   pregabalin (LYRICA) 200 MG capsule      PRESCRIPTION MEDICATION CPAP: At bedtime and "as needed for shortness of breath"     tobramycin (TOBREX) 0.3 % ophthalmic solution      torsemide (DEMADEX) 20 MG tablet Take 3 tablets (60 mg total) by mouth every other day. 135 tablet 3   TRUE METRIX BLOOD GLUCOSE TEST test strip      TRUEplus Lancets 28G MISC      vitamin B-12 (CYANOCOBALAMIN) 500 MCG tablet Take 500 mcg by mouth every morning.     Vitamin D3 (VITAMIN D) 25 MCG tablet Take 1,000 Units by mouth every morning.  predniSONE (DELTASONE) 10 MG tablet Take 10 mg by mouth daily.     predniSONE (DELTASONE) 10 MG tablet Take 2 tablets once daily for 3 days x 1 tablet once daily for 3 days then STOP. 9 tablet 0   losartan (COZAAR) 50 MG tablet Take 1 tablet (50 mg total) by mouth daily. 90 tablet 3   metoprolol succinate (TOPROL-XL) 100 MG 24 hr tablet Take 1 tablet (100 mg total) by mouth daily. Take with or immediately following a meal. 90  tablet 3   No facility-administered medications prior to visit.     Review of Systems:   Constitutional:   No  weight loss, night sweats,  Fevers, chills, fatigue, or  lassitude.  HEENT:   No headaches,  Difficulty swallowing,  Tooth/dental problems, or  Sore throat,                No sneezing, itching, ear ache, +nasal congestion, post nasal drip,   CV:  No chest pain,  Orthopnea, PND, swelling in lower extremities, anasarca, dizziness, palpitations, syncope.   GI  No heartburn, indigestion, abdominal pain, nausea, vomiting, diarrhea, change in bowel habits, loss of appetite, bloody stools.   Resp:  .  No chest wall deformity  Skin: no rash or lesions.  GU: no dysuria, change in color of urine, no urgency or frequency.  No flank pain, no hematuria   MS:  No joint pain or swelling.  No decreased range of motion.  No back pain.    Physical Exam  BP 130/70   Pulse 90   Temp 98.5 F (36.9 C)   Ht 5\' 6"  (1.676 m)   Wt 260 lb (117.9 kg)   SpO2 96%   BMI 41.97 kg/m   GEN: A/Ox3; pleasant , NAD, well nourished    HEENT:  [place]/AT,  NOSE-clear, THROAT-clear, no lesions, no postnasal drip or exudate noted.   NECK:  Supple w/ fair ROM; no JVD; normal carotid impulses w/o bruits; no thyromegaly or nodules palpated; no lymphadenopathy.    RESP scattered rhonchi bilaterally no accessory muscle use, no dullness to percussion  CARD:  RRR, no m/r/g, no peripheral edema, pulses intact, no cyanosis or clubbing.  GI:   Soft & nt; nml bowel sounds; no organomegaly or masses detected.   Musco: Warm bil, no deformities or joint swelling noted.   Neuro: alert, no focal deficits noted.    Skin: Warm, no lesions or rashes    Lab Results:   BMET   Imaging: DG Chest 2 View  Result Date: 10/16/2020 CLINICAL DATA:  Cough, COPD EXAM: CHEST - 2 VIEW COMPARISON:  10/02/2019 FINDINGS: Heart size and vascularity normal. Lungs clear without infiltrate or effusion. Dual lead pacemaker  unchanged. ACDF lower cervical spine. No change from the prior study. IMPRESSION: No active cardiopulmonary disease. Electronically Signed   By: Franchot Gallo M.D.   On: 10/16/2020 11:08   ECHOCARDIOGRAM COMPLETE  Result Date: 10/03/2020    ECHOCARDIOGRAM REPORT   Patient Name:   KAWANDA DRUMHELLER Date of Exam: 10/03/2020 Medical Rec #:  [id number]        Height:       66.0 in Accession #:    [id number]       Weight:       258.8 lb Date of Birth:  March 20, 1941       BSA:          2.231 m Patient Age:    65 years  BP:           148/60 mmHg Patient Gender: F                HR:           66 bpm. Exam Location:  Outpatient Procedure: 2D Echo and Strain Analysis Indications:    Dyspnea R06.00  History:        Patient has prior history of Echocardiogram examinations, most                 recent 08/13/2019. CAD, COPD, Arrythmias:Atrial Fibrillation;                 Risk Factors:Hypertension, Dyslipidemia and Diabetes.  Sonographer:    Mikki Santee RDCS (AE) Referring Phys: [id number] [place]  1. Left ventricular ejection fraction, by estimation, is 60 to 65%. The left ventricle has normal function. The left ventricle has no regional wall motion abnormalities. There is severe concentric left ventricular hypertrophy. Left ventricular diastolic  parameters are consistent with Grade II diastolic dysfunction (pseudonormalization). The global longitudinal strain is normal (assessed in the A4c view with no foreshortening).  2. Right ventricular systolic function is normal. The right ventricular size is normal. There is normal pulmonary artery systolic pressure.  3. The mitral valve is grossly normal. No evidence of mitral valve regurgitation.  4. The aortic valve is tricuspid. Aortic valve regurgitation is not visualized. No aortic stenosis is present. Comparison(s): A prior study was performed on 08/13/19. Prior images reviewed side by side. LV is slightly less dynamic. FINDINGS  Left Ventricle:  Left ventricular ejection fraction, by estimation, is 60 to 65%. The left ventricle has normal function. The left ventricle has no regional wall motion abnormalities. The global longitudinal strain is normal. The left ventricular internal cavity size was normal in size. There is severe concentric left ventricular hypertrophy. Left ventricular diastolic parameters are consistent with Grade II diastolic dysfunction (pseudonormalization). Right Ventricle: The right ventricular size is normal. No increase in right ventricular wall thickness. Right ventricular systolic function is normal. There is normal pulmonary artery systolic pressure. The tricuspid regurgitant velocity is 1.66 m/s, and  with an assumed right atrial pressure of 3 mmHg, the estimated right ventricular systolic pressure is 08.6 mmHg. Left Atrium: Left atrial size was normal in size. Right Atrium: Right atrial size was normal in size. Pericardium: There is no evidence of pericardial effusion. Mitral Valve: The mitral valve is grossly normal. No evidence of mitral valve regurgitation. Tricuspid Valve: The tricuspid valve is grossly normal. Tricuspid valve regurgitation is trivial. Aortic Valve: The aortic valve is tricuspid. Aortic valve regurgitation is not visualized. No aortic stenosis is present. Pulmonic Valve: The pulmonic valve was not well visualized. Pulmonic valve regurgitation is not visualized. No evidence of pulmonic stenosis. Aorta: The aortic root and ascending aorta are structurally normal, with no evidence of dilitation. IAS/Shunts: The atrial septum is grossly normal. Additional Comments: A device lead is visualized.  LEFT VENTRICLE PLAX 2D LVIDd:         3.80 cm  Diastology LVIDs:         2.70 cm  LV e' medial:    6.08 cm/s LV PW:         1.30 cm  LV E/e' medial:  13.4 LV IVS:        1.60 cm  LV e' lateral:   5.40 cm/s LVOT diam:     2.10 cm  LV E/e' lateral: 15.0 LV SV:  80 LV SV Index:   36       2D Longitudinal Strain LVOT  Area:     3.46 cm 2D Strain GLS (A4C):   -21.0 %  RIGHT VENTRICLE RV S prime:     10.10 cm/s TAPSE (M-mode): 1.6 cm LEFT ATRIUM             Index       RIGHT ATRIUM           Index LA diam:        3.50 cm 1.57 cm/m  RA Area:     15.80 cm LA Vol (A2C):   55.0 ml 24.65 ml/m RA Volume:   36.10 ml  16.18 ml/m LA Vol (A4C):   39.9 ml 17.88 ml/m LA Biplane Vol: 46.8 ml 20.97 ml/m  AORTIC VALVE LVOT Vmax:   109.00 cm/s LVOT Vmean:  71.300 cm/s LVOT VTI:    0.232 m  AORTA Ao Root diam: 2.90 cm Ao Asc diam:  3.60 cm MITRAL VALVE               TRICUSPID VALVE MV Area (PHT): 3.08 cm    TR Peak grad:   11.0 mmHg MV Decel Time: 246 msec    TR Vmax:        166.00 cm/s MV E velocity: 81.20 cm/s MV A velocity: 83.20 cm/s  SHUNTS MV E/A ratio:  0.98        Systemic VTI:  0.23 m                            Systemic Diam: 2.10 cm Rudean Haskell MD Electronically signed by Rudean Haskell MD Signature Date/Time: 10/03/2020/4:23:30 PM    Final    CUP PACEART REMOTE DEVICE CHECK  Result Date: 09/23/2020 Scheduled remote reviewed. Normal device function.  1 AF episode x 2 mins 16 secs. +OAC. V response controlled. Burden <0.1%. Next remote 91 days. LH     No flowsheet data found.  Lab Results  Component Value Date   NITRICOXIDE 7 03/09/2016        Assessment & Plan:   COPD with acute exacerbation (HCC) Slow to resolve COPD exacerbation.  Chest x-ray today shows no acute process. Check sputum culture.  Hold on additional antibiotics as she has completed 2 courses of antibiotics.  We will rechallenge with short prednisone taper.  And cough control regimen  Patient is on amiodarone chest x-ray today shows no acute process.  However if symptoms or not improving will need to consider further work-up if cough persist  Plan  Patient Instructions  Depo Medrol shot today  Prednisone 20mg  daily for 5 days - start tomorrow .  Mucinex DM Twice daily  As needed  cough/congestion  Tessalon Three times a  day  As needed  cough .  Continue on BREZTRI 2 puffs Twice daily   Albuterol inhaler /Neb As needed   Chest xray today  Sputum culture today .  Follow up with Dr. Halford Chessman  in 4-6 weeks and As needed   Please contact office for sooner follow up if symptoms do not improve or worsen or seek emergency care        OBSTRUCTIVE SLEEP APNEA Continue on CPAP at bedtime keep up the good work.  She has excellent control and compliance  Diastolic heart failure (HCC) Appears euvolemic on exam.  Continue follow-up with cardiology.  Recent echo showed grade 2 diastolic dysfunction.  Patient  has underlying chronic kidney disease .  Continue current dose of diuretics.  DM (diabetes mellitus) We discussed her diabetes.  She is to watch her sugars carefully as she is on prednisone.  Reported blood sugars are consistently above 250.    I spent   42 minutes dedicated to the care of this patient on the date of this encounter to include pre-visit review of records, face-to-face time with the patient discussing conditions above, post visit ordering of testing, clinical documentation with the electronic health record, making appropriate referrals as documented, and communicating necessary findings to members of the patients care team.   Rexene Edison, NP 10/16/2020

## 2020-10-16 NOTE — Assessment & Plan Note (Signed)
Continue on CPAP at bedtime keep up the good work.  She has excellent control and compliance

## 2020-10-16 NOTE — Assessment & Plan Note (Signed)
We discussed her diabetes.  She is to watch her sugars carefully as she is on prednisone.  Reported blood sugars are consistently above 250.

## 2020-10-18 ENCOUNTER — Ambulatory Visit: Payer: Medicare HMO | Admitting: Podiatry

## 2020-10-19 LAB — RESPIRATORY CULTURE OR RESPIRATORY AND SPUTUM CULTURE
MICRO NUMBER:: 12010862
RESULT:: NORMAL
SPECIMEN QUALITY:: ADEQUATE

## 2020-10-22 ENCOUNTER — Other Ambulatory Visit: Payer: Self-pay | Admitting: Cardiology

## 2020-10-23 NOTE — Progress Notes (Signed)
Called and spoke with patient.  Provided results/recommendations per Rexene Edison NP.  She verbalized understanding.  Patient is still coughing and getting up chunks of yellow phlegm.  She is taking the Tessalon 3 times per day and states it is not really helping.  She is using the musinex as instructed.  She cannot lay down to sleep, She denies any fever, chills or body aches. She wants to know is there is anything else she can do.  Tammy, please advise if she can do anything else to help control the cough.  Thank you.

## 2020-10-24 ENCOUNTER — Other Ambulatory Visit: Payer: Self-pay | Admitting: Physician Assistant

## 2020-10-24 ENCOUNTER — Telehealth: Payer: Self-pay | Admitting: Adult Health

## 2020-10-24 DIAGNOSIS — R0602 Shortness of breath: Secondary | ICD-10-CM

## 2020-10-24 DIAGNOSIS — R059 Cough, unspecified: Secondary | ICD-10-CM

## 2020-10-24 DIAGNOSIS — Z79899 Other long term (current) drug therapy: Secondary | ICD-10-CM

## 2020-10-24 MED ORDER — PREDNISONE 20 MG PO TABS: 20.0000 mg | ORAL_TABLET | Freq: Every day | ORAL | 0 refills | Status: DC

## 2020-10-24 NOTE — Telephone Encounter (Signed)
-----   Message from Vanessa Barbara, RN sent at 10/23/2020  9:56 AM EDT ----- Hulen Skains and spoke with patient.  Provided results/recommendations per Rexene Edison NP.  She verbalized understanding.  Patient is still coughing and getting up chunks of yellow phlegm.  She is taking the Tessalon 3 times per day and states it is not really helping.  She is using the musinex as instructed.  She cannot lay down to sleep, She denies any fever, chills or body aches. She wants to know is there is anything else she can do.  Folasade Mooty, please advise if she can do anything else to help control the cough.  Thank you.

## 2020-10-24 NOTE — Telephone Encounter (Signed)
Patient complains of ongoing cough shortness of breath for the last 2 to 3 months.  She has had several courses of antibiotics and prednisone and is not improving.  Patient is on amiodarone.  Please set up for high-resolution CT chest read chronic cough, on amiodarone  Begin prednisone 20 mg daily for 5 days take with food (rx sent )  Begin Robitussin-DM 2 teaspoons every 4 hours as needed for cough Tessalon 200 mg 1 3 times daily as needed for cough Follow-up next week and As needed   Please contact office for sooner follow up if symptoms do not improve or worsen or seek emergency care

## 2020-10-24 NOTE — Telephone Encounter (Signed)
LM x1 for patient.  

## 2020-10-25 ENCOUNTER — Ambulatory Visit (INDEPENDENT_AMBULATORY_CARE_PROVIDER_SITE_OTHER): Payer: Medicare HMO | Admitting: Podiatry

## 2020-10-25 ENCOUNTER — Other Ambulatory Visit: Payer: Self-pay

## 2020-10-25 DIAGNOSIS — E1169 Type 2 diabetes mellitus with other specified complication: Secondary | ICD-10-CM | POA: Diagnosis not present

## 2020-10-25 DIAGNOSIS — I872 Venous insufficiency (chronic) (peripheral): Secondary | ICD-10-CM | POA: Diagnosis not present

## 2020-10-25 DIAGNOSIS — R600 Localized edema: Secondary | ICD-10-CM

## 2020-10-25 DIAGNOSIS — E1142 Type 2 diabetes mellitus with diabetic polyneuropathy: Secondary | ICD-10-CM | POA: Diagnosis not present

## 2020-10-25 DIAGNOSIS — B351 Tinea unguium: Secondary | ICD-10-CM

## 2020-10-25 NOTE — Telephone Encounter (Signed)
Called and spoke with patient, advised or recommendations per Rexene Edison NP.  She stated she already picked up the prednisone script.  Advised one of our PCCs will call her to schedule the HR CT scan.  She verbalized understanding.  Nothing further needed.

## 2020-10-25 NOTE — Telephone Encounter (Signed)
ATC Patient.  LM for Patient to call office.

## 2020-10-25 NOTE — Progress Notes (Signed)
  Subjective:  Patient ID: Kayla [name], female    DOB: 03-Aug-1940,  MRN: [id number]  Chief Complaint  Patient presents with   Leg Problem    Left lower extremity blistering 2-3 week duration   Nail Problem    Nail trim bilateral   DOS: 12/15/19 Procedure: Left great toe partial amputation   80 y.o. female presents with the above complaint. History confirmed with patient.   Swelling worsened BLE, 2-3 weeks ago had blisters form followed by sores. Objective:  Physical Exam: no tenderness at the surgical site. Incision: well healed without ulceration Onychomycosis of remaining digits. Good warmth to foot. Protective sensation absent.  Multiple small venous ulcers without warmth, erythema, SOI.  Assessment:   1. Venous (peripheral) insufficiency   2. Localized edema   3. Onychomycosis of multiple toenails with type 2 diabetes mellitus and peripheral neuropathy ([place])    Plan:  Patient was evaluated and treated and all questions answered.  Venous insufficiency left -Worsened, now with ulcer -Multilayer compression dressing applied  Procedure: Multilayer Compression dressing Rationale: venous insufficiency Technique: Unna boot, cast padding, Coban compression dressing applied Disposition: Patient tolerated procedure well.   Onychomycosis -Nails x10   Procedure: Nail Debridement Type of Debridement: manual, sharp debridement. Instrumentation: Nail nipper, rotary burr. Number of Nails: 10   Return in about 1 month (around 11/24/2020) for venous ulcer f/u.

## 2020-10-28 ENCOUNTER — Ambulatory Visit: Payer: Medicare HMO | Admitting: Pulmonary Disease

## 2020-10-29 ENCOUNTER — Ambulatory Visit: Payer: Medicare HMO | Admitting: Pulmonary Disease

## 2020-10-30 NOTE — Progress Notes (Signed)
Called and spoke with patient, advised or recommendations per Rexene Edison NP.  She stated she already picked up the prednisone script.  Advised one of our PCCs will call her to schedule the HR CT scan.  She verbalized understanding.  Nothing further needed. Patient complains of ongoing cough shortness of breath for the last 2 to 3 months.  She has had several courses of antibiotics and prednisone and is not improving.  Patient is on amiodarone.  Please set up for high-resolution CT chest read chronic cough, on amiodarone  Begin prednisone 20 mg daily for 5 days take with food (rx sent ) Begin Robitussin-DM 2 teaspoons every 4 hours as needed for cough Tessalon 200 mg 1 3 times daily as needed for cough Follow-up next week and As needed   Please contact office for sooner follow up if symptoms do not improve or worsen or seek emergency care

## 2020-11-11 DIAGNOSIS — Z6841 Body Mass Index (BMI) 40.0 and over, adult: Secondary | ICD-10-CM | POA: Diagnosis not present

## 2020-11-11 DIAGNOSIS — Z794 Long term (current) use of insulin: Secondary | ICD-10-CM | POA: Diagnosis not present

## 2020-11-11 DIAGNOSIS — M5441 Lumbago with sciatica, right side: Secondary | ICD-10-CM | POA: Diagnosis not present

## 2020-11-11 DIAGNOSIS — R600 Localized edema: Secondary | ICD-10-CM | POA: Diagnosis not present

## 2020-11-11 DIAGNOSIS — E11649 Type 2 diabetes mellitus with hypoglycemia without coma: Secondary | ICD-10-CM | POA: Diagnosis not present

## 2020-11-11 DIAGNOSIS — E782 Mixed hyperlipidemia: Secondary | ICD-10-CM | POA: Diagnosis not present

## 2020-11-11 DIAGNOSIS — J449 Chronic obstructive pulmonary disease, unspecified: Secondary | ICD-10-CM | POA: Diagnosis not present

## 2020-11-11 DIAGNOSIS — G8929 Other chronic pain: Secondary | ICD-10-CM | POA: Diagnosis not present

## 2020-11-11 DIAGNOSIS — N1832 Chronic kidney disease, stage 3b: Secondary | ICD-10-CM | POA: Diagnosis not present

## 2020-11-14 ENCOUNTER — Ambulatory Visit (INDEPENDENT_AMBULATORY_CARE_PROVIDER_SITE_OTHER)
Admission: RE | Admit: 2020-11-14 | Discharge: 2020-11-14 | Disposition: A | Discharge status: Home / Self Care | Payer: Medicare HMO | Source: Ambulatory Visit | Attending: Adult Health | Admitting: Adult Health

## 2020-11-14 ENCOUNTER — Other Ambulatory Visit: Payer: Self-pay

## 2020-11-14 ENCOUNTER — Other Ambulatory Visit: Payer: Self-pay | Admitting: Internal Medicine

## 2020-11-14 DIAGNOSIS — R0602 Shortness of breath: Secondary | ICD-10-CM

## 2020-11-14 DIAGNOSIS — Z79899 Other long term (current) drug therapy: Secondary | ICD-10-CM

## 2020-11-14 DIAGNOSIS — R911 Solitary pulmonary nodule: Secondary | ICD-10-CM | POA: Diagnosis not present

## 2020-11-14 DIAGNOSIS — R059 Cough, unspecified: Secondary | ICD-10-CM | POA: Diagnosis not present

## 2020-11-14 DIAGNOSIS — R918 Other nonspecific abnormal finding of lung field: Secondary | ICD-10-CM | POA: Diagnosis not present

## 2020-11-14 DIAGNOSIS — I700 Atherosclerosis of aorta: Secondary | ICD-10-CM | POA: Diagnosis not present

## 2020-11-19 ENCOUNTER — Ambulatory Visit: Payer: Medicare HMO | Admitting: Pulmonary Disease

## 2020-11-21 DIAGNOSIS — R600 Localized edema: Secondary | ICD-10-CM | POA: Diagnosis not present

## 2020-11-21 DIAGNOSIS — E782 Mixed hyperlipidemia: Secondary | ICD-10-CM | POA: Diagnosis not present

## 2020-11-21 DIAGNOSIS — N1832 Chronic kidney disease, stage 3b: Secondary | ICD-10-CM | POA: Diagnosis not present

## 2020-11-22 ENCOUNTER — Other Ambulatory Visit: Payer: Self-pay | Admitting: *Deleted

## 2020-11-22 DIAGNOSIS — R918 Other nonspecific abnormal finding of lung field: Secondary | ICD-10-CM

## 2020-11-22 NOTE — Progress Notes (Signed)
Called and spoke with patient, provided results/recommendations per Tammy Parrett NP.  She verbalized understanding.  Nothing further needed.

## 2020-11-29 ENCOUNTER — Ambulatory Visit (INDEPENDENT_AMBULATORY_CARE_PROVIDER_SITE_OTHER): Payer: Medicare HMO | Admitting: Podiatry

## 2020-11-29 DIAGNOSIS — Z5329 Procedure and treatment not carried out because of patient's decision for other reasons: Secondary | ICD-10-CM

## 2020-11-29 NOTE — Progress Notes (Signed)
No show for appt. 

## 2020-12-02 ENCOUNTER — Other Ambulatory Visit: Payer: Self-pay | Admitting: Internal Medicine

## 2020-12-04 DIAGNOSIS — M1710 Unilateral primary osteoarthritis, unspecified knee: Secondary | ICD-10-CM | POA: Diagnosis not present

## 2020-12-04 DIAGNOSIS — E349 Endocrine disorder, unspecified: Secondary | ICD-10-CM | POA: Diagnosis not present

## 2020-12-04 DIAGNOSIS — M549 Dorsalgia, unspecified: Secondary | ICD-10-CM | POA: Diagnosis not present

## 2020-12-04 DIAGNOSIS — Z79899 Other long term (current) drug therapy: Secondary | ICD-10-CM | POA: Diagnosis not present

## 2020-12-04 DIAGNOSIS — M353 Polymyalgia rheumatica: Secondary | ICD-10-CM | POA: Diagnosis not present

## 2020-12-04 DIAGNOSIS — Z1389 Encounter for screening for other disorder: Secondary | ICD-10-CM | POA: Diagnosis not present

## 2020-12-04 DIAGNOSIS — M8580 Other specified disorders of bone density and structure, unspecified site: Secondary | ICD-10-CM | POA: Diagnosis not present

## 2020-12-04 DIAGNOSIS — G894 Chronic pain syndrome: Secondary | ICD-10-CM | POA: Diagnosis not present

## 2020-12-09 ENCOUNTER — Encounter: Payer: Self-pay | Admitting: Pulmonary Disease

## 2020-12-09 ENCOUNTER — Ambulatory Visit: Payer: Medicare HMO | Admitting: Pulmonary Disease

## 2020-12-09 ENCOUNTER — Other Ambulatory Visit: Payer: Self-pay

## 2020-12-09 VITALS — BP 132/74 | HR 79 | Temp 98.0°F | Ht 66.0 in | Wt 258.4 lb

## 2020-12-09 DIAGNOSIS — I5032 Chronic diastolic (congestive) heart failure: Secondary | ICD-10-CM

## 2020-12-09 DIAGNOSIS — R0602 Shortness of breath: Secondary | ICD-10-CM | POA: Diagnosis not present

## 2020-12-09 DIAGNOSIS — J449 Chronic obstructive pulmonary disease, unspecified: Secondary | ICD-10-CM | POA: Diagnosis not present

## 2020-12-09 DIAGNOSIS — G4733 Obstructive sleep apnea (adult) (pediatric): Secondary | ICD-10-CM

## 2020-12-09 DIAGNOSIS — Z9989 Dependence on other enabling machines and devices: Secondary | ICD-10-CM | POA: Diagnosis not present

## 2020-12-09 NOTE — Patient Instructions (Signed)
Follow up in 6 months 

## 2020-12-09 NOTE — Progress Notes (Signed)
[facility]  Chief Complaint  Patient presents with   Follow-up    SOB    Constitutional:  BP 132/74 (BP Location: Left Arm, Cuff Size: Normal)   Pulse 79   Temp 98 F (36.7 C) (Oral)   Ht 5\' 6"  (1.676 m)   Wt 258 lb 6.4 oz (117.2 kg)   SpO2 97%   BMI 41.71 kg/m   Past Medical History:  Hypothyroidism s/p thyroidectomy, HTN, HLD, HA, GERD, Diverticulosis, DM, Cataract, CAD, OA, Anxiety, Allergies  Past Surgical History:  She  has a past surgical history that includes Thyroidectomy (2011); Total knee arthroplasty (07/27/2011); Abdominal hysterectomy; Anterior cervical decomp/discectomy fusion (N/A, 11/07/2014); Cardiac catheterization (N/A, 11/08/2015); Lumbar disc surgery (1990's); Cholecystectomy (N/A, 04/29/2016); Upper esophageal endoscopic ultrasound (eus) (N/A, 05/14/2016); Cataract extraction w/ intraocular lens  implant, bilateral (Bilateral); Insert / replace / remove pacemaker (2017); Cardiac catheterization; Back surgery; Appendectomy; Joint replacement; Artery Biopsy (Right, 02/16/2017); Amputation toe (Left, 12/15/2019); and Artery Biopsy (Left, 04/16/2020).  Brief Summary:  Kayla [name] is a 80 y.o. female former smoker with COPD and obstructive sleep apnea.      Subjective:   She had CT chest.  This showed scattered tiny nodules and scarring.  Echo showed diastolic CHF.  She is limited with activity due to back pain.  She gets winded after walking about 25 feet.  Takes a few minutes of rest to catch her breath.  Has cough with clear sputum.  Not having fever, chest pain or wheezing.  Has leg swelling Rt > Lt.  Uses CPAP nightly w/o difficulty.  Physical Exam:   Appearance - well kempt   ENMT - no sinus tenderness, no oral exudate, no LAN, Mallampati 3 airway, no stridor  Respiratory - equal breath sounds bilaterally, no wheezing or rales  CV - irregular  Ext - 1+ leg swelling  Skin - no rashes  Psych -  normal mood and affect    Pulmonary testing:  Spirometry 10/19/08 >> FEV1 1.55 (68%), FEV1% 70  Spirometry 08/29/12 >> FEV1 1.38 (59%), FEV1% 58 FeNO 03/09/16 >> 7  Chest Imaging:  HRCT chest 11/16/20 >> atherosclerosis, scattered areas of scarring, several tiny nodules up to 2 mm  Sleep Tests:  PSG 02/2005 >> AHI 10  CPAP titration 02/19/14 >> CPAP 11 cm H2O >> AHI 0.7, PLMI 81.4, did not need supplemental oxygen Auto CPAP 11/06/20 to 12/05/20 >> used on 30 of 30 nights with average 11 hrs 8 min.  Average AHI 2.3 with median CPAP 7 and 95 th percentile CPAP 11 cm H2O  Cardiac Tests:  Echo 10/03/20 >> EF 60 to 65%, severe LVH, grade 2 DD  Social History:  She  reports that she quit smoking about 23 years ago. Her smoking use included cigarettes. She started smoking about 56 years ago. She has a 66.00 pack-year smoking history. She has never used smokeless tobacco. She reports that she does not drink alcohol and does not use drugs.  Family History:  Her family history includes Diabetes in her maternal aunt and maternal uncle; Heart Problems in her father and mother; Heart defect in her mother; Heart disease in her brother; Lung cancer in her daughter; Throat cancer in her brother.    Discussion:  She continues to have shortness of breath with exertion.  This is likely combination of COPD, diastolic CHF, and joint/back pain with deconditioning.  Explained that these are chronic conditions and she is on appropriate therapy, and  we need to monitor for exacerbations.  Assessment/Plan:   COPD with chronic bronchitis. - intolerant of trelegy and daliresp - did better with incruse, but wasn't covered by insurance - spiriva not as effective and has more cough since switching to this - continue breztri and albuterol with spacer device  Obstructive sleep apnea. - she is compliant with CPAP and reports benefit from CPAP - she uses [employer] for her DME - continue auto CPAP 5 to 15 cm H2O    Obesity. - discussed importance of weight loss  Sinus node dysfunction, PAF, S/P PM, Chronic diastolic CHF. - followed by Dr. Caryl Comes with [facility]  Time Spent Involved in Patient Care on Day of Examination:  34 minutes  Follow up:   Patient Instructions  Follow up in 6 months  Medication List:   Allergies as of 12/09/2020       Reactions   Dulaglutide Nausea Only, Other (See Comments), Cough   Made the patient sick to her stomach (only) several days after using it Trulicity        Medication List        Accurate as of December 09, 2020 12:13 PM. If you have any questions, ask your nurse or doctor.          STOP taking these medications    cephALEXin 250 MG capsule Commonly known as: KEFLEX Stopped by: Chesley Mires, MD   predniSONE 20 MG tablet Commonly known as: DELTASONE Stopped by: Chesley Mires, MD       TAKE these medications    acetaminophen 500 MG tablet Commonly known as: TYLENOL Take 1,000 mg by mouth every 6 (six) hours as needed for moderate pain (for headaches).   albuterol (2.5 MG/3ML) 0.083% nebulizer solution Commonly known as: PROVENTIL Take 3 mLs (2.5 mg total) by nebulization every 2 (two) hours as needed for wheezing or shortness of breath.   albuterol 108 (90 Base) MCG/ACT inhaler Commonly known as: Ventolin HFA Inhale 2 puffs into the lungs every 6 (six) hours as needed for wheezing or shortness of breath.   allopurinol 100 MG tablet Commonly known as: ZYLOPRIM Take by mouth.   allopurinol 100 MG tablet Commonly known as: ZYLOPRIM Take 100 mg by mouth 2 (two) times daily.   amiodarone 200 MG tablet Commonly known as: PACERONE Take 400mg  - (2 tablets) by mouth twice daily x 2 weeks then reduce to 400mg  (2 tablet) by mouth once daily x 2 weeks   amiodarone 200 MG tablet Commonly known as: PACERONE Take 1 tablet (200 mg total) by mouth daily.   ascorbic acid 500 MG tablet Commonly known as: VITAMIN C Take 500 mg by  mouth every morning.   ascorbic acid 500 MG tablet Commonly known as: VITAMIN C Take by mouth.   atorvastatin 40 MG tablet Commonly known as: LIPITOR TAKE 1 TABLET EVERY DAY   azithromycin 250 MG tablet Commonly known as: ZITHROMAX Take 2 tablets today and then 1 tablet daily until gone.   B-D SINGLE USE SWABS REGULAR Pads   benzonatate 200 MG capsule Commonly known as: TESSALON Take 1 capsule (200 mg total) by mouth 3 (three) times daily as needed for cough.   Breztri Aerosphere 160-9-4.8 MCG/ACT Aero Generic drug: Budeson-Glycopyrrol-Formoterol Inhale 2 puffs into the lungs in the morning and at bedtime.   cycloSPORINE 0.05 % ophthalmic emulsion Commonly known as: RESTASIS Place 1 drop into both eyes 2 (two) times daily.   cycloSPORINE 0.05 % ophthalmic emulsion Commonly known as: RESTASIS  Place 1 drop into both eyes daily.   Eliquis 5 MG Tabs tablet Generic drug: apixaban TAKE 1 TABLET TWICE DAILY   ferrous sulfate 325 (65 FE) MG tablet Take 1 tablet (325 mg total) by mouth daily with breakfast.   fluticasone 50 MCG/ACT nasal spray Commonly known as: FLONASE USE 2 SPRAYS NASALLY DAILY   HYDROcodone-acetaminophen 5-325 MG tablet Commonly known as: Norco Take 1 tablet by mouth every 6 (six) hours as needed for moderate pain or severe pain.   insulin aspart 100 UNIT/ML FlexPen Commonly known as: NOVOLOG Inject 10-17 Units into the skin See admin instructions. Inject 30-70 units into the skin three times a day with meals, per sliding scale (and an additional 1 unit for every 50 points for a BGL >130) Sliding scale   insulin glargine 100 UNIT/ML injection Commonly known as: LANTUS Inject 130 Units into the skin in the morning and at bedtime.   levothyroxine 112 MCG tablet Commonly known as: SYNTHROID Take by mouth.   levothyroxine 200 MCG tablet Commonly known as: SYNTHROID Take 175 mcg by mouth daily before breakfast.   levothyroxine 112 MCG  tablet Commonly known as: SYNTHROID   LORazepam 2 MG tablet Commonly known as: ATIVAN Take 2 mg by mouth at bedtime as needed for anxiety.   losartan 50 MG tablet Commonly known as: COZAAR Take by mouth.   losartan 50 MG tablet Commonly known as: COZAAR Take 1 tablet (50 mg total) by mouth daily.   losartan 50 MG tablet Commonly known as: COZAAR TAKE 1 TABLET EVERY DAY   magnesium oxide 400 (241.3 Mg) MG tablet Commonly known as: MAG-OX Take 400 mg by mouth 3 (three) times daily.   magnesium oxide 400 MG tablet Commonly known as: MAG-OX Take by mouth.   metoprolol succinate 100 MG 24 hr tablet Commonly known as: TOPROL-XL TAKE 1 TABLET EVERY DAY WITH OR IMMEDIATELY AFTER A MEAL   nitroGLYCERIN 0.4 MG SL tablet Commonly known as: NITROSTAT Place 1 tablet (0.4 mg total) under the tongue every 5 (five) minutes as needed for chest pain.   pregabalin 200 MG capsule Commonly known as: LYRICA   PRESCRIPTION MEDICATION CPAP: At bedtime and "as needed for shortness of breath"   tobramycin 0.3 % ophthalmic ointment Commonly known as: TOBREX Place 1 application into both eyes 3 (three) times a day.   tobramycin 0.3 % ophthalmic solution Commonly known as: TOBREX   torsemide 20 MG tablet Commonly known as: Demadex Take 3 tablets (60 mg total) by mouth every other day.   True Metrix Blood Glucose Test test strip Generic drug: glucose blood   True Metrix Level 1 Low Soln   TRUEplus Lancets 28G Misc   TUSSIN DM MAX SUGAR-FREE PO Take 5 mLs by mouth every 4 (four) hours as needed (for coughing).   vitamin B-12 500 MCG tablet Commonly known as: CYANOCOBALAMIN Take 500 mcg by mouth every morning.   Vitamin D3 25 MCG tablet Commonly known as: Vitamin D Take 1,000 Units by mouth every morning.        Signature:  Chesley Mires, MD [place] Pager - [phone] 12/09/2020, 12:13 PM

## 2020-12-12 DIAGNOSIS — E1121 Type 2 diabetes mellitus with diabetic nephropathy: Secondary | ICD-10-CM | POA: Diagnosis not present

## 2020-12-12 DIAGNOSIS — R600 Localized edema: Secondary | ICD-10-CM | POA: Diagnosis not present

## 2020-12-12 DIAGNOSIS — Z6841 Body Mass Index (BMI) 40.0 and over, adult: Secondary | ICD-10-CM | POA: Diagnosis not present

## 2020-12-12 DIAGNOSIS — R059 Cough, unspecified: Secondary | ICD-10-CM | POA: Diagnosis not present

## 2020-12-12 DIAGNOSIS — N184 Chronic kidney disease, stage 4 (severe): Secondary | ICD-10-CM | POA: Diagnosis not present

## 2020-12-12 DIAGNOSIS — N1832 Chronic kidney disease, stage 3b: Secondary | ICD-10-CM | POA: Diagnosis not present

## 2020-12-17 ENCOUNTER — Other Ambulatory Visit: Payer: Self-pay | Admitting: Nephrology

## 2020-12-17 DIAGNOSIS — N2581 Secondary hyperparathyroidism of renal origin: Secondary | ICD-10-CM | POA: Diagnosis not present

## 2020-12-17 DIAGNOSIS — I129 Hypertensive chronic kidney disease with stage 1 through stage 4 chronic kidney disease, or unspecified chronic kidney disease: Secondary | ICD-10-CM | POA: Diagnosis not present

## 2020-12-17 DIAGNOSIS — D631 Anemia in chronic kidney disease: Secondary | ICD-10-CM | POA: Diagnosis not present

## 2020-12-17 DIAGNOSIS — E785 Hyperlipidemia, unspecified: Secondary | ICD-10-CM | POA: Diagnosis not present

## 2020-12-17 DIAGNOSIS — R609 Edema, unspecified: Secondary | ICD-10-CM | POA: Diagnosis not present

## 2020-12-17 DIAGNOSIS — N1832 Chronic kidney disease, stage 3b: Secondary | ICD-10-CM | POA: Diagnosis not present

## 2020-12-17 DIAGNOSIS — R809 Proteinuria, unspecified: Secondary | ICD-10-CM | POA: Diagnosis not present

## 2020-12-23 ENCOUNTER — Ambulatory Visit
Admission: RE | Admit: 2020-12-23 | Discharge: 2020-12-23 | Disposition: A | Discharge status: Home / Self Care | Payer: Medicare HMO | Source: Ambulatory Visit | Attending: Nephrology | Admitting: Nephrology

## 2020-12-23 DIAGNOSIS — N1832 Chronic kidney disease, stage 3b: Secondary | ICD-10-CM

## 2020-12-23 DIAGNOSIS — N281 Cyst of kidney, acquired: Secondary | ICD-10-CM | POA: Diagnosis not present

## 2020-12-26 DIAGNOSIS — M1710 Unilateral primary osteoarthritis, unspecified knee: Secondary | ICD-10-CM | POA: Diagnosis not present

## 2020-12-26 DIAGNOSIS — M549 Dorsalgia, unspecified: Secondary | ICD-10-CM | POA: Diagnosis not present

## 2020-12-26 DIAGNOSIS — Z1389 Encounter for screening for other disorder: Secondary | ICD-10-CM | POA: Diagnosis not present

## 2020-12-26 DIAGNOSIS — M8580 Other specified disorders of bone density and structure, unspecified site: Secondary | ICD-10-CM | POA: Diagnosis not present

## 2020-12-26 DIAGNOSIS — M353 Polymyalgia rheumatica: Secondary | ICD-10-CM | POA: Diagnosis not present

## 2020-12-26 DIAGNOSIS — G894 Chronic pain syndrome: Secondary | ICD-10-CM | POA: Diagnosis not present

## 2020-12-26 DIAGNOSIS — E349 Endocrine disorder, unspecified: Secondary | ICD-10-CM | POA: Diagnosis not present

## 2021-01-09 ENCOUNTER — Ambulatory Visit (INDEPENDENT_AMBULATORY_CARE_PROVIDER_SITE_OTHER): Payer: Medicare HMO | Admitting: Internal Medicine

## 2021-01-09 ENCOUNTER — Encounter: Payer: Self-pay | Admitting: Internal Medicine

## 2021-01-09 ENCOUNTER — Other Ambulatory Visit: Payer: Self-pay

## 2021-01-09 VITALS — BP 136/60 | HR 63 | Ht 66.0 in | Wt 255.6 lb

## 2021-01-09 DIAGNOSIS — I495 Sick sinus syndrome: Secondary | ICD-10-CM

## 2021-01-09 DIAGNOSIS — Z950 Presence of cardiac pacemaker: Secondary | ICD-10-CM

## 2021-01-09 DIAGNOSIS — I480 Paroxysmal atrial fibrillation: Secondary | ICD-10-CM

## 2021-01-09 DIAGNOSIS — Z79899 Other long term (current) drug therapy: Secondary | ICD-10-CM | POA: Diagnosis not present

## 2021-01-09 MED ORDER — METOLAZONE 2.5 MG PO TABS: ORAL_TABLET | ORAL | 0 refills | Status: DC

## 2021-01-09 MED ORDER — CEPHALEXIN 250 MG PO CAPS: 250.0000 mg | ORAL_CAPSULE | Freq: Three times a day (TID) | ORAL | 0 refills | Status: DC

## 2021-01-09 NOTE — Progress Notes (Signed)
Patient Care Team: Hamrick, Lorin Mercy, MD as PCP - General (Family Medicine) Jerline Pain, MD as PCP - Cardiology (Cardiology) Milus Banister, MD as Consulting Physician (Gastroenterology)   HPI  Kayla [name] is a 80 y.o. female Seen in follow-up for pacemaker implanted (7/17)  for sinus bradycardia and atrial fibrillation resistant to dofetilide and assoc with RVR   in the past have considered AV junction ablation  Dofetilide initiated 7/21 but discontinued because of lack of effectiveness.  Has had discussions regarding amiodarone and AV ablation as well as the possibility of PVI  Most recently, decision was made to begin amiodarone  Nonobstructive coronary artery disease by catheterization 2006 with a low risk Myoview 2013.      The patient denies chest pain, .  There have been no palpitations, lightheadedness or syncope.  Complains of significant shortness of breath now at less than 15 to 20 feet.  Increasing abdominal girth peripheral edema which is weeping.  Also has noted erythema of her right lower extremity.  Nocturnal dyspnea and orthopnea not withstanding her CPAP.  Her diuretic is variably effective  Patient denies symptoms of GI intolerance, sun sensitivity, neurological symptoms attributable to amiodarone.    Discordant laboratory result on TSH as noted below Less symptomatic atrial fibrillation.  No clinical bleeding  Unaware of her anemia issue (See Below) has been started on iron not withstanding normal iron studies 4/21 Hemoglobin better as noted below     DATE TEST EF   3/15 Echo   60-65 %   8/19 CTA    % LAD,CX,RCA diffuse FFR ( distal OM2) 0.78  4/21 Echo 60-65% LVH (11mm), RAE LA normal   Date Cr K Hgb TSH LFTs  10/21 1.32 4.5 9.1 2.64     5/22      11.4 0.41+ 3.4 (different labs) 18           Past Medical History:  Diagnosis Date   A-fib ([place])    Allergy    Anxiety    Arthritis    CAD (coronary artery disease)    Mild per  remote cath in 2006, /    Nuclear, January, 2013, no significant abnormality,   Carotid artery disease ([place])    Doppler, January, 2013, 0 73% RIC A., 22-02% LICA, stable   Cataract    CHF (congestive heart failure) ([place])    Complication of anesthesia    wakes up "mean"   COPD 02/16/2007   Intolerant of Spiriva, tudorza Intolerant of Trelegy    COPD (chronic obstructive pulmonary disease) ([place])    Coronary atherosclerosis 11/24/2008   Catheterization 2006, nonobstructive coronary disease   //   nuclear 2013, low risk     Diabetes mellitus, type 2 ([place])    Diverticula of colon    DIVERTICULOSIS OF COLON 03/23/2007   Qualifier: Diagnosis of  By: Halford Chessman MD, Vineet     Dizziness    occasional   Dizzy spells 05/04/2011   DM (diabetes mellitus) ([place]) 03/23/2007   Qualifier: Diagnosis of  By: Halford Chessman MD, Vineet     Dyspnea    On exertion   Ejection fraction    Essential hypertension 02/16/2007   Qualifier: Diagnosis of  By: Rosana Hoes CMA, Tammy     G E R D 03/23/2007   Qualifier: Diagnosis of  By: Halford Chessman MD, Vineet     Gall bladder disease 04/28/2016   Gastritis and gastroduodenitis    GERD (gastroesophageal reflux disease)  Goiter    hx of   Headache(784.0)    migraine   Hyperlipidemia    Hypertension    Hypothyroidism    HYPOTHYROIDISM, POSTSURGICAL 06/04/2008   Qualifier: Diagnosis of  By: Loanne Drilling MD, Sean A    Left ventricular hypertrophy    Leg edema    occasional swelling   Leg swelling 12/09/2018   Myofascial pain syndrome, cervical 01/06/2018   Obesity    OBESITY 11/24/2008   Qualifier: Diagnosis of  By: Jaramillo, [place] 02/16/2007   Auto CPAP 09/03/13 to 10/02/13 >> used on 30 of 30 nights with average 6 hrs and 3 min.  Average AHI is 3.1 with median CPAP 5 cm H2O and 95 th percentile CPAP 6 cm H2O.     Occipital neuralgia of right side 01/06/2018   OSA (obstructive sleep apnea)    cpap setting of 12   Pain in joint of right hip 01/21/2018   Palpitations  01/21/2011   48 hour Holter, 2015, scattered PACs and PVCs.    Pneumonia    PONV (postoperative nausea and vomiting)    severe after every surgery   Pulmonary hypertension, secondary    RHINITIS 07/21/2010        RUQ abdominal pain    S/P left TKA 07/27/2011   Sinus node dysfunction ([place]) 11/08/2015   Sleep apnea    Spinal stenosis of cervical region 11/07/2014   Urinary frequency 12/09/2018   UTI (urinary tract infection) 08/15/2019   per pt report   VENTRICULAR HYPERTROPHY, LEFT 11/24/2008   Qualifier: Diagnosis of  By: Sidney Ace      Past Surgical History:  Procedure Laterality Date   ABDOMINAL HYSTERECTOMY     with appendectomy and colon polypectomy   AMPUTATION TOE Left 12/15/2019   Procedure: AMPUTATION  LEFT GREAT TOE;  Surgeon: Evelina Bucy, DPM;  Location: [facility];  Service: Podiatry;  Laterality: Left;   ANTERIOR CERVICAL DECOMP/DISCECTOMY FUSION N/A 11/07/2014   Procedure: Anterior Cervical diskectomy and fusion Cervical four-five, Cervical five-six, Cervical six-seven;  Surgeon: Kary Kos, MD;  Location: [facility];  Service: Neurosurgery;  Laterality: N/A;   APPENDECTOMY     ARTERY BIOPSY Right 02/16/2017   Procedure: BIOPSY TEMPORAL ARTERY;  Surgeon: Katha Cabal, MD;  Location: [facility] ORS;  Service: Vascular;  Laterality: Right;   ARTERY BIOPSY Left 04/16/2020   Procedure: BIOPSY TEMPORAL ARTERY;  Surgeon: Katha Cabal, MD;  Location: [facility] ORS;  Service: Vascular;  Laterality: Left;  LEFT TEMPLE   BACK SURGERY     CARDIAC CATHETERIZATION     CATARACT EXTRACTION W/ INTRAOCULAR LENS  IMPLANT, BILATERAL Bilateral    CHOLECYSTECTOMY N/A 04/29/2016   Procedure: LAPAROSCOPIC CHOLECYSTECTOMY WITH INTRAOPERATIVE CHOLANGIOGRAM;  Surgeon: Alphonsa Overall, MD;  Location: [facility];  Service: General;  Laterality: N/A;   EP IMPLANTABLE DEVICE N/A 11/08/2015   MRI COMPATABLE -- Procedure: Pacemaker Implant;  Surgeon: Deboraha Sprang, MD;  Location: [place] [facility];  Service:  Cardiovascular;  Laterality: N/A;   INSERT / REPLACE / REMOVE PACEMAKER  2017   Dr. Jens Som [facility])   JOINT REPLACEMENT     LUMBAR [place] SURGERY  1990's   x 2   THYROIDECTOMY  2011   TOTAL KNEE ARTHROPLASTY  07/27/2011   Procedure: TOTAL KNEE ARTHROPLASTY;  Surgeon: Mauri Pole, MD;  Location: [facility];  Service: Orthopedics;  Laterality: Left;   UPPER ESOPHAGEAL ENDOSCOPIC ULTRASOUND (EUS) N/A 05/14/2016   Procedure:  UPPER ESOPHAGEAL ENDOSCOPIC ULTRASOUND (EUS);  Surgeon: Milus Banister, MD;  Location: [facility];  Service: Endoscopy;  Laterality: N/A;  radial only-will be done mod if needed    Current Outpatient Medications  Medication Sig Dispense Refill   acetaminophen (TYLENOL) 500 MG tablet Take 1,000 mg by mouth every 6 (six) hours as needed for moderate pain (for headaches).      albuterol (PROVENTIL) (2.5 MG/3ML) 0.083% nebulizer solution Take 3 mLs (2.5 mg total) by nebulization every 2 (two) hours as needed for wheezing or shortness of breath. 75 mL 5   albuterol (VENTOLIN HFA) 108 (90 Base) MCG/ACT inhaler Inhale 2 puffs into the lungs every 6 (six) hours as needed for wheezing or shortness of breath. 3 each 3   Alcohol Swabs (B-D SINGLE USE SWABS REGULAR) PADS      amiodarone (PACERONE) 200 MG tablet Take 1 tablet (200 mg total) by mouth daily. 90 tablet 3   ascorbic acid (VITAMIN C) 500 MG tablet Take 500 mg by mouth every morning.     atorvastatin (LIPITOR) 40 MG tablet TAKE 1 TABLET EVERY DAY 90 tablet 3   benzonatate (TESSALON) 200 MG capsule Take 1 capsule (200 mg total) by mouth 3 (three) times daily as needed for cough. 45 capsule 1   Blood Glucose Calibration (TRUE METRIX LEVEL 1) Low SOLN      Budeson-Glycopyrrol-Formoterol (BREZTRI AEROSPHERE) 160-9-4.8 MCG/ACT AERO Inhale 2 puffs into the lungs in the morning and at bedtime. 32.1 g 2   cycloSPORINE (RESTASIS) 0.05 % ophthalmic emulsion Place 1 drop into both eyes 2 (two) times daily.      Dextromethorphan-guaiFENesin (TUSSIN DM MAX SUGAR-FREE PO) Take 5 mLs by mouth every 4 (four) hours as needed (for coughing).     ELIQUIS 5 MG TABS tablet TAKE 1 TABLET TWICE DAILY 180 tablet 1   ferrous sulfate 325 (65 FE) MG tablet Take 1 tablet (325 mg total) by mouth daily with breakfast. 90 tablet 3   fluticasone (FLONASE) 50 MCG/ACT nasal spray USE 2 SPRAYS NASALLY DAILY 48 g 1   HYDROcodone-acetaminophen (NORCO) 5-325 MG tablet Take 1 tablet by mouth every 6 (six) hours as needed for moderate pain or severe pain. 15 tablet 0   insulin aspart (NOVOLOG) 100 UNIT/ML FlexPen Inject 10-17 Units into the skin See admin instructions. Inject 30-70 units into the skin three times a day with meals, per sliding scale (and an additional 1 unit for every 50 points for a BGL >130) Sliding scale     insulin glargine (LANTUS) 100 UNIT/ML injection Inject 130 Units into the skin in the morning and at bedtime.     levothyroxine (SYNTHROID) 112 MCG tablet      LORazepam (ATIVAN) 2 MG tablet Take 2 mg by mouth at bedtime as needed for anxiety.      losartan (COZAAR) 50 MG tablet Take 1 tablet (50 mg total) by mouth daily. 90 tablet 3   magnesium oxide (MAG-OX) 400 MG tablet Take by mouth.     metoprolol succinate (TOPROL-XL) 100 MG 24 hr tablet TAKE 1 TABLET EVERY DAY WITH OR IMMEDIATELY AFTER A MEAL 90 tablet 3   nitroGLYCERIN (NITROSTAT) 0.4 MG SL tablet Place 1 tablet (0.4 mg total) under the tongue every 5 (five) minutes as needed for chest pain. 25 tablet 4   PRESCRIPTION MEDICATION CPAP: At bedtime and "as needed for shortness of breath"     tobramycin (TOBREX) 0.3 % ophthalmic ointment Place 1 application into both eyes 3 (  three) times a day.     torsemide (DEMADEX) 20 MG tablet Take 3 tablets (60 mg total) by mouth every other day. 135 tablet 3   TRUE METRIX BLOOD GLUCOSE TEST test strip      TRUEplus Lancets 28G MISC      vitamin B-12 (CYANOCOBALAMIN) 500 MCG tablet Take 500 mcg by mouth every  morning.     Vitamin D3 (VITAMIN D) 25 MCG tablet Take 1,000 Units by mouth every morning.     allopurinol (ZYLOPRIM) 100 MG tablet Take 100 mg by mouth 2 (two) times daily.  (Patient not taking: Reported on 01/09/2021)     allopurinol (ZYLOPRIM) 100 MG tablet Take by mouth. (Patient not taking: Reported on 01/09/2021)     amiodarone (PACERONE) 200 MG tablet Take 400mg  - (2 tablets) by mouth twice daily x 2 weeks then reduce to 400mg  (2 tablet) by mouth once daily x 2 weeks (Patient not taking: Reported on 01/09/2021) 84 tablet 0   ascorbic acid (VITAMIN C) 500 MG tablet Take by mouth. (Patient not taking: Reported on 01/09/2021)     azithromycin (ZITHROMAX) 250 MG tablet Take 2 tablets today and then 1 tablet daily until gone. (Patient not taking: Reported on 01/09/2021) 6 tablet 0   cycloSPORINE (RESTASIS) 0.05 % ophthalmic emulsion Place 1 drop into both eyes daily. (Patient not taking: Reported on 01/09/2021)     levothyroxine (SYNTHROID) 112 MCG tablet Take by mouth. (Patient not taking: Reported on 01/09/2021)     levothyroxine (SYNTHROID) 200 MCG tablet Take 175 mcg by mouth daily before breakfast.  (Patient not taking: Reported on 01/09/2021)     losartan (COZAAR) 50 MG tablet Take by mouth. (Patient not taking: Reported on 01/09/2021)     losartan (COZAAR) 50 MG tablet TAKE 1 TABLET EVERY DAY (Patient not taking: Reported on 01/09/2021) 90 tablet 3   magnesium oxide (MAG-OX) 400 (241.3 Mg) MG tablet Take 400 mg by mouth 3 (three) times daily.  (Patient not taking: Reported on 01/09/2021)     pregabalin (LYRICA) 200 MG capsule  (Patient not taking: Reported on 01/09/2021)     tobramycin (TOBREX) 0.3 % ophthalmic solution  (Patient not taking: Reported on 01/09/2021)     No current facility-administered medications for this visit.    Allergies  Allergen Reactions   Dulaglutide Nausea Only, Other (See Comments) and Cough    Made the patient sick to her stomach (only) several days after using it Trulicity       Review of Systems negative except from HPI and PMH  Physical Exam   BP 136/60   Pulse 63   Ht 5\' 6"  (1.676 m)   Wt 255 lb 9.6 oz (115.9 kg)   SpO2 94%   BMI 41.25 kg/m  Well developed and morbidly obese in mild respiratory distress HENT normal Neck supple with JVP unable to discern clear Device pocket well healed; without hematoma or erythema.  There is no tethering  Regular rate and rhythm, no  gallop No / murmur Abd-soft with active BS No Clubbing cyanosis 2+ edema Skin-warm and dry erythema right lower leg A & Oriented  Grossly normal sensory and motor function  ECG sinus rhythm at 63 Intervals 20/08/43  Pacemaker interrogation demonstrates initial improvement in her atrial fibrillation burden following initiation of amiodarone more atrial fibrillation of late but not nearly as much as prior to initiation of amiodarone  Assessment and Plan  Sinus node dysfunction   Atrial fibrillation-paroxysmal  High risk medication surveillance-amiodarone  Pacemaker-[employer]     COPD  Renal insufficiency grade 3  OSA treated   Hypertension  HFpEF acute/chronic  Lower extremity cellulitis  Treated hypothyroidism    The patient has less atrial fibrillation on amiodarone.  For now we will continue to 200 mg daily.  She needs amiodarone surveillance laboratories which we will obtain next week for reasons listed below.  She is significantly volume overloaded with symptoms of congestive heart failure.  She has a secondary lower extremity cellulitis.  We will push her diuretic; however, we do it with some reluctance because of her renal insufficiency.  We will change her Demadex 60--80 daily and tomorrow and 3 days from now we will have her take metolazone 2.5.  We will check blood work next Wednesday.  If we are unable to diurese her orally we will have to admit her to hospital for intravenous diuretics.  To try to protect her renal function, we will discontinue her  losartan and use amlodipine for blood pressure starting at 5 mg a day.  No clinical bleeding on the anticoagulation.  Her last hemoglobin was much improved.  We will check it again next week   Lower extremity cellulitis.  We will begin her cephalexin to 50 mg weekly x5 days.  Concerned about the potential for the cellulitis to secondarily infected pacemaker

## 2021-01-09 NOTE — Patient Instructions (Signed)
Medication Instructions:  Your physician has recommended you make the following change in your medication:   ** Stop Losartan **  Restart Amlodipine 5mg  - 1 tablet by mouth daily ** Increase Demadex from 60mg  to 80mg  daily ** Take Metalozone 2.5mg  - 1 tablet by mouth starting tomorrow 01/10/2021 and on 01/13/2021 only ** Begin Keflex 250mg  - 1 tablet by mouth every 8 hours x 5 days     *If you need a refill on your cardiac medications before your next appointment, please call your pharmacy*   Lab Work: CBC, CMET and TSH on Wednesday 01/15/2021  If you have labs (blood work) drawn today and your tests are completely normal, you will receive your results only by: [place] (if you have MyChart) OR A paper copy in the mail If you have any lab test that is abnormal or we need to change your treatment, we will call you to review the results.   Testing/Procedures: None ordered.    Follow-Up: At [facility], you and your health needs are our priority.  As part of our continuing mission to provide you with exceptional heart care, we have created designated Provider Care Teams.  These Care Teams include your primary Cardiologist (physician) and Advanced Practice Providers (APPs -  Physician Assistants and Nurse Practitioners) who all work together to provide you with the care you need, when you need it.  We recommend signing up for the patient portal called "MyChart".  Sign up information is provided on this After Visit Summary.  MyChart is used to connect with patients for Virtual Visits (Telemedicine).  Patients are able to view lab/test results, encounter notes, upcoming appointments, etc.  Non-urgent messages can be sent to your provider as well.   To learn more about what you can do with MyChart, go to [email or web address].    Your next appointment:   Follow up with [name], PA-C in 3 weeks

## 2021-01-09 NOTE — Progress Notes (Signed)
Patient Care Team: Hamrick, Lorin Mercy, MD as PCP - General (Family Medicine) Jerline Pain, MD as PCP - Cardiology (Cardiology) Milus Banister, MD as Consulting Physician (Gastroenterology)   HPI  Kayla [name] is a 80 y.o. female Seen in follow-up for pacemaker implanted (7/17)  for sinus bradycardia and atrial fibrillation resistant to dofetilide and assoc with RVR   in the past have considered AV junction ablation  Dofetilide initiated 7/21 but discontinued because of lack of effectiveness.  Has had discussions regarding amiodarone and AV ablation as well as the possibility of PVI  Most recently, decision was made to begin amiodarone  Nonobstructive coronary artery disease by catheterization 2006 with a low risk Myoview 2013.        Today,she is not feeling well. She is experiencing shortness of breath more often. Her breathing worsens when walking and has worsened for the last 3 months.   She reports her abdomen and face is swollen and have LE edema in her legs. She says her right leg has blisters and water running out of it.   She uses a CPAP machine at night however the machine effects her breathing due to mask on her face. She says it feels like its smothering her.   She uses her fluid pills everyday, and have days where she urinates more frequent or only urinates one time.  The patient denies chest pain, nocturnal dyspnea, or orthopnea.  There have been no palpitations, lightheadedness or syncope.  Complains of shortness of breath.  ,Patient denies symptoms of GI intolerance, sun sensitivity, neurological symptoms attributable to amiodarone.     Episodes of afib are assoc with palps and profound diaphoresis and weakness   Unaware of her anemia issue (See Below) has been started on iron not withstanding normal iron studies 4/21      DATE TEST EF   08/04/22 Echo   60-65 %   8/19 CTA    % LAD,CX,RCA diffuse FFR ( distal OM2) 0.78  4/21 Echo 60-65% LVH (65mm),  RAE LA normal   Date Cr K Hgb TSH LFTs  10/21 1.32 4.5 9.1 2.64     5/22      11.4 0.41 18             Past Medical History:  Diagnosis Date   A-fib ([place])    Allergy    Anxiety    Arthritis    CAD (coronary artery disease)    Mild per remote cath in 2006, /    Nuclear, January, 2013, no significant abnormality,   Carotid artery disease ([place])    Doppler, January, 2013, 0 25% RIC A., 95-63% LICA, stable   Cataract    CHF (congestive heart failure) ([place])    Complication of anesthesia    wakes up "mean"   COPD 02/16/2007   Intolerant of Spiriva, tudorza Intolerant of Trelegy    COPD (chronic obstructive pulmonary disease) ([place])    Coronary atherosclerosis 11/24/2008   Catheterization 2006, nonobstructive coronary disease   //   nuclear 2013, low risk     Diabetes mellitus, type 2 ([place])    Diverticula of colon    DIVERTICULOSIS OF COLON 03/23/2007   Qualifier: Diagnosis of  By: Halford Chessman MD, Vineet     Dizziness    occasional   Dizzy spells 05/04/2011   DM (diabetes mellitus) ([place]) 03/23/2007   Qualifier: Diagnosis of  By: Halford Chessman MD, Vineet     Dyspnea  On exertion   Ejection fraction    Essential hypertension 02/16/2007   Qualifier: Diagnosis of  By: Rosana Hoes CMA, Tammy     G E R D 03/23/2007   Qualifier: Diagnosis of  By: Halford Chessman MD, Vineet     Gall bladder disease 04/28/2016   Gastritis and gastroduodenitis    GERD (gastroesophageal reflux disease)    Goiter    hx of   Headache(784.0)    migraine   Hyperlipidemia    Hypertension    Hypothyroidism    HYPOTHYROIDISM, POSTSURGICAL 06/04/2008   Qualifier: Diagnosis of  By: Loanne Drilling MD, Sean A    Left ventricular hypertrophy    Leg edema    occasional swelling   Leg swelling 12/09/2018   Myofascial pain syndrome, cervical 01/06/2018   Obesity    OBESITY 11/24/2008   Qualifier: Diagnosis of  By: Sidney Ace     OBSTRUCTIVE SLEEP APNEA 02/16/2007   Auto CPAP 09/03/13 to 10/02/13 >> used on 30 of 30 nights with average 6 hrs  and 3 min.  Average AHI is 3.1 with median CPAP 5 cm H2O and 95 th percentile CPAP 6 cm H2O.     Occipital neuralgia of right side 01/06/2018   OSA (obstructive sleep apnea)    cpap setting of 12   Pain in joint of right hip 01/21/2018   Palpitations 01/21/2011   48 hour Holter, 2015, scattered PACs and PVCs.    Pneumonia    PONV (postoperative nausea and vomiting)    severe after every surgery   Pulmonary hypertension, secondary    RHINITIS 07/21/2010        RUQ abdominal pain    S/P left TKA 07/27/2011   Sinus node dysfunction ([place]) 11/08/2015   Sleep apnea    Spinal stenosis of cervical region 11/07/2014   Urinary frequency 12/09/2018   UTI (urinary tract infection) 08/15/2019   per pt report   VENTRICULAR HYPERTROPHY, LEFT 11/24/2008   Qualifier: Diagnosis of  By: Sidney Ace      Past Surgical History:  Procedure Laterality Date   ABDOMINAL HYSTERECTOMY     with appendectomy and colon polypectomy   AMPUTATION TOE Left 12/15/2019   Procedure: AMPUTATION  LEFT GREAT TOE;  Surgeon: Evelina Bucy, DPM;  Location: [facility];  Service: Podiatry;  Laterality: Left;   ANTERIOR CERVICAL DECOMP/DISCECTOMY FUSION N/A 11/07/2014   Procedure: Anterior Cervical diskectomy and fusion Cervical four-five, Cervical five-six, Cervical six-seven;  Surgeon: Kary Kos, MD;  Location: [facility];  Service: Neurosurgery;  Laterality: N/A;   APPENDECTOMY     ARTERY BIOPSY Right 02/16/2017   Procedure: BIOPSY TEMPORAL ARTERY;  Surgeon: Katha Cabal, MD;  Location: [facility];  Service: Vascular;  Laterality: Right;   ARTERY BIOPSY Left 04/16/2020   Procedure: BIOPSY TEMPORAL ARTERY;  Surgeon: Katha Cabal, MD;  Location: [facility];  Service: Vascular;  Laterality: Left;  LEFT TEMPLE   BACK SURGERY     CARDIAC CATHETERIZATION     CATARACT EXTRACTION W/ INTRAOCULAR LENS  IMPLANT, BILATERAL Bilateral    CHOLECYSTECTOMY N/A 04/29/2016   Procedure: LAPAROSCOPIC CHOLECYSTECTOMY WITH INTRAOPERATIVE  CHOLANGIOGRAM;  Surgeon: Alphonsa Overall, MD;  Location: [facility];  Service: General;  Laterality: N/A;   EP IMPLANTABLE DEVICE N/A 11/08/2015   MRI COMPATABLE -- Procedure: Pacemaker Implant;  Surgeon: Deboraha Sprang, MD;  Location: [facility];  Service: Cardiovascular;  Laterality: N/A;   INSERT / REPLACE / REMOVE PACEMAKER  2017   Dr. Jens Som (  [place])   JOINT REPLACEMENT     LUMBAR [place] SURGERY  1990's   x 2   THYROIDECTOMY  2011   TOTAL KNEE ARTHROPLASTY  07/27/2011   Procedure: TOTAL KNEE ARTHROPLASTY;  Surgeon: Mauri Pole, MD;  Location: [facility];  Service: Orthopedics;  Laterality: Left;   UPPER ESOPHAGEAL ENDOSCOPIC ULTRASOUND (EUS) N/A 05/14/2016   Procedure: UPPER ESOPHAGEAL ENDOSCOPIC ULTRASOUND (EUS);  Surgeon: Milus Banister, MD;  Location: [facility];  Service: Endoscopy;  Laterality: N/A;  radial only-will be done mod if needed    Current Outpatient Medications  Medication Sig Dispense Refill   acetaminophen (TYLENOL) 500 MG tablet Take 1,000 mg by mouth every 6 (six) hours as needed for moderate pain (for headaches).      albuterol (PROVENTIL) (2.5 MG/3ML) 0.083% nebulizer solution Take 3 mLs (2.5 mg total) by nebulization every 2 (two) hours as needed for wheezing or shortness of breath. 75 mL 5   albuterol (VENTOLIN HFA) 108 (90 Base) MCG/ACT inhaler Inhale 2 puffs into the lungs every 6 (six) hours as needed for wheezing or shortness of breath. 3 each 3   Alcohol Swabs (B-D SINGLE USE SWABS REGULAR) PADS      amiodarone (PACERONE) 200 MG tablet Take 1 tablet (200 mg total) by mouth daily. 90 tablet 3   ascorbic acid (VITAMIN C) 500 MG tablet Take 500 mg by mouth every morning.     atorvastatin (LIPITOR) 40 MG tablet TAKE 1 TABLET EVERY DAY 90 tablet 3   benzonatate (TESSALON) 200 MG capsule Take 1 capsule (200 mg total) by mouth 3 (three) times daily as needed for cough. 45 capsule 1   Blood Glucose Calibration (TRUE METRIX LEVEL 1) Low SOLN       Budeson-Glycopyrrol-Formoterol (BREZTRI AEROSPHERE) 160-9-4.8 MCG/ACT AERO Inhale 2 puffs into the lungs in the morning and at bedtime. 32.1 g 2   cycloSPORINE (RESTASIS) 0.05 % ophthalmic emulsion Place 1 drop into both eyes 2 (two) times daily.     Dextromethorphan-guaiFENesin (TUSSIN DM MAX SUGAR-FREE PO) Take 5 mLs by mouth every 4 (four) hours as needed (for coughing).     ELIQUIS 5 MG TABS tablet TAKE 1 TABLET TWICE DAILY 180 tablet 1   ferrous sulfate 325 (65 FE) MG tablet Take 1 tablet (325 mg total) by mouth daily with breakfast. 90 tablet 3   fluticasone (FLONASE) 50 MCG/ACT nasal spray USE 2 SPRAYS NASALLY DAILY 48 g 1   HYDROcodone-acetaminophen (NORCO) 5-325 MG tablet Take 1 tablet by mouth every 6 (six) hours as needed for moderate pain or severe pain. 15 tablet 0   insulin aspart (NOVOLOG) 100 UNIT/ML FlexPen Inject 10-17 Units into the skin See admin instructions. Inject 30-70 units into the skin three times a day with meals, per sliding scale (and an additional 1 unit for every 50 points for a BGL >130) Sliding scale     insulin glargine (LANTUS) 100 UNIT/ML injection Inject 130 Units into the skin in the morning and at bedtime.     levothyroxine (SYNTHROID) 112 MCG tablet      LORazepam (ATIVAN) 2 MG tablet Take 2 mg by mouth at bedtime as needed for anxiety.      losartan (COZAAR) 50 MG tablet Take 1 tablet (50 mg total) by mouth daily. 90 tablet 3   magnesium oxide (MAG-OX) 400 MG tablet Take by mouth.     metoprolol succinate (TOPROL-XL) 100 MG 24 hr tablet TAKE 1 TABLET EVERY DAY WITH OR IMMEDIATELY AFTER A  MEAL 90 tablet 3   nitroGLYCERIN (NITROSTAT) 0.4 MG SL tablet Place 1 tablet (0.4 mg total) under the tongue every 5 (five) minutes as needed for chest pain. 25 tablet 4   PRESCRIPTION MEDICATION CPAP: At bedtime and "as needed for shortness of breath"     tobramycin (TOBREX) 0.3 % ophthalmic ointment Place 1 application into both eyes 3 (three) times a day.     torsemide  (DEMADEX) 20 MG tablet Take 3 tablets (60 mg total) by mouth every other day. 135 tablet 3   TRUE METRIX BLOOD GLUCOSE TEST test strip      TRUEplus Lancets 28G MISC      vitamin B-12 (CYANOCOBALAMIN) 500 MCG tablet Take 500 mcg by mouth every morning.     Vitamin D3 (VITAMIN D) 25 MCG tablet Take 1,000 Units by mouth every morning.     allopurinol (ZYLOPRIM) 100 MG tablet Take 100 mg by mouth 2 (two) times daily.  (Patient not taking: Reported on 01/09/2021)     allopurinol (ZYLOPRIM) 100 MG tablet Take by mouth. (Patient not taking: Reported on 01/09/2021)     amiodarone (PACERONE) 200 MG tablet Take 400mg  - (2 tablets) by mouth twice daily x 2 weeks then reduce to 400mg  (2 tablet) by mouth once daily x 2 weeks (Patient not taking: Reported on 01/09/2021) 84 tablet 0   ascorbic acid (VITAMIN C) 500 MG tablet Take by mouth. (Patient not taking: Reported on 01/09/2021)     azithromycin (ZITHROMAX) 250 MG tablet Take 2 tablets today and then 1 tablet daily until gone. (Patient not taking: Reported on 01/09/2021) 6 tablet 0   cycloSPORINE (RESTASIS) 0.05 % ophthalmic emulsion Place 1 drop into both eyes daily. (Patient not taking: Reported on 01/09/2021)     levothyroxine (SYNTHROID) 112 MCG tablet Take by mouth. (Patient not taking: Reported on 01/09/2021)     levothyroxine (SYNTHROID) 200 MCG tablet Take 175 mcg by mouth daily before breakfast.  (Patient not taking: Reported on 01/09/2021)     losartan (COZAAR) 50 MG tablet Take by mouth. (Patient not taking: Reported on 01/09/2021)     losartan (COZAAR) 50 MG tablet TAKE 1 TABLET EVERY DAY (Patient not taking: Reported on 01/09/2021) 90 tablet 3   magnesium oxide (MAG-OX) 400 (241.3 Mg) MG tablet Take 400 mg by mouth 3 (three) times daily.  (Patient not taking: Reported on 01/09/2021)     pregabalin (LYRICA) 200 MG capsule  (Patient not taking: Reported on 01/09/2021)     tobramycin (TOBREX) 0.3 % ophthalmic solution  (Patient not taking: Reported on 01/09/2021)     No  current facility-administered medications for this visit.    Allergies  Allergen Reactions   Dulaglutide Nausea Only, Other (See Comments) and Cough    Made the patient sick to her stomach (only) several days after using it Trulicity      Review of Systems negative except from HPI and PMH  (+) Swelling in abdomen  (+) swelling in face (+) LE edema - blisters (+)shortness of breath  Physical Exam   BP 136/60   Pulse 63   Ht 5\' 6"  (1.676 m)   Wt 255 lb 9.6 oz (115.9 kg)   SpO2 94%   BMI 41.25 kg/m  Well developed and Morbidly obese in no acute distress HENT normal Neck supple  Clear Device pocket well healed; without hematoma or erythema.  There is no tethering   Regular rate and rhythm, no murmur Abd-soft with active BS No Clubbing cyanosis  R>L , 2+ edema Skin-warm and dry A & Oriented  Grossly normal sensory and motor function  ECG atrial fibrillation with ventricular pacing at 80 with fusion Intervals-/08/37  Assessment and Plan  Sinus node dysfunction   Atrial fibrillation-paroxysmal  High risk medication surveillance-amiodarone  Pacemaker-[employer]     COPD  OSA treated   Hypertension  HFpEF  Patient continues to have paroxysms of atrial fibrillation associate with significant symptoms i.e. diaphoresis.  We discussed treatment strategies again, rhythm control versus rate control.  We discussed amiodarone and its side effect profile versus augmentation of rate control with either a calcium blocker in her digoxin to her beta-blocker.  She would like to pursue amiodarone at this point.  We will begin her on 400 twice daily for 2 weeks and 400 daily for 2 weeks and then 200 a day.  We will check her surveillance laboratories in 6 weeks time.  We will see her in 12 weeks time.  Atrial fibrillation burden is somewhat less about 15%, previously about 20%  She continues to have peripheral edema albeit asymmetric.  We will increase her Demadex from 40--60  every other day.  Some of her fluid retention may be related to her steroids not being used for polymyalgia rheumatica  Blood pressure well controlled  Anemia needs further attention will defer to PCP  On iron but with normal ferritin    I,Jada Bradford,acting as a scribe for Virl Axe, MD.,have documented all relevant documentation on the behalf of Virl Axe, MD,as directed by  Virl Axe, MD while in the presence of Virl Axe, MD.  I, Virl Axe, MD, have reviewed all documentation for this visit. The documentation on 01/09/21 for the exam, diagnosis, procedures, and orders are all accurate and complete.

## 2021-01-14 ENCOUNTER — Other Ambulatory Visit: Payer: Self-pay

## 2021-01-14 ENCOUNTER — Emergency Department (HOSPITAL_COMMUNITY)
Admission: EM | Admit: 2021-01-14 | Discharge: 2021-01-14 | Disposition: A | Discharge status: Home / Self Care | Payer: Medicare HMO | Attending: Emergency Medicine | Admitting: Emergency Medicine

## 2021-01-14 ENCOUNTER — Encounter (HOSPITAL_COMMUNITY): Payer: Self-pay

## 2021-01-14 ENCOUNTER — Emergency Department (HOSPITAL_COMMUNITY): Payer: Medicare HMO

## 2021-01-14 DIAGNOSIS — Z7901 Long term (current) use of anticoagulants: Secondary | ICD-10-CM | POA: Diagnosis not present

## 2021-01-14 DIAGNOSIS — S8991XA Unspecified injury of right lower leg, initial encounter: Secondary | ICD-10-CM | POA: Diagnosis present

## 2021-01-14 DIAGNOSIS — I110 Hypertensive heart disease with heart failure: Secondary | ICD-10-CM | POA: Insufficient documentation

## 2021-01-14 DIAGNOSIS — S81811A Laceration without foreign body, right lower leg, initial encounter: Secondary | ICD-10-CM | POA: Diagnosis not present

## 2021-01-14 DIAGNOSIS — S81011A Laceration without foreign body, right knee, initial encounter: Secondary | ICD-10-CM | POA: Diagnosis not present

## 2021-01-14 DIAGNOSIS — I480 Paroxysmal atrial fibrillation: Secondary | ICD-10-CM | POA: Insufficient documentation

## 2021-01-14 DIAGNOSIS — Z96651 Presence of right artificial knee joint: Secondary | ICD-10-CM | POA: Insufficient documentation

## 2021-01-14 DIAGNOSIS — J441 Chronic obstructive pulmonary disease with (acute) exacerbation: Secondary | ICD-10-CM | POA: Diagnosis not present

## 2021-01-14 DIAGNOSIS — E119 Type 2 diabetes mellitus without complications: Secondary | ICD-10-CM | POA: Diagnosis not present

## 2021-01-14 DIAGNOSIS — Z7951 Long term (current) use of inhaled steroids: Secondary | ICD-10-CM | POA: Diagnosis not present

## 2021-01-14 DIAGNOSIS — Z794 Long term (current) use of insulin: Secondary | ICD-10-CM | POA: Insufficient documentation

## 2021-01-14 DIAGNOSIS — Z79899 Other long term (current) drug therapy: Secondary | ICD-10-CM | POA: Insufficient documentation

## 2021-01-14 DIAGNOSIS — E039 Hypothyroidism, unspecified: Secondary | ICD-10-CM | POA: Diagnosis not present

## 2021-01-14 DIAGNOSIS — I2510 Atherosclerotic heart disease of native coronary artery without angina pectoris: Secondary | ICD-10-CM | POA: Insufficient documentation

## 2021-01-14 DIAGNOSIS — M1711 Unilateral primary osteoarthritis, right knee: Secondary | ICD-10-CM | POA: Diagnosis not present

## 2021-01-14 DIAGNOSIS — W010XXA Fall on same level from slipping, tripping and stumbling without subsequent striking against object, initial encounter: Secondary | ICD-10-CM | POA: Insufficient documentation

## 2021-01-14 DIAGNOSIS — I5033 Acute on chronic diastolic (congestive) heart failure: Secondary | ICD-10-CM | POA: Diagnosis not present

## 2021-01-14 MED ORDER — OXYCODONE HCL 5 MG PO TABS: 5.0000 mg | ORAL_TABLET | Freq: Four times a day (QID) | ORAL | 0 refills | Status: DC | PRN

## 2021-01-14 MED ORDER — MORPHINE SULFATE (PF) 2 MG/ML IV SOLN
2.0000 mg | Freq: Once | INTRAVENOUS | Status: AC
  Administered 2021-01-14: 2 mg via INTRAVENOUS
  Filled 2021-01-14: qty 1

## 2021-01-14 MED ORDER — DOXYCYCLINE HYCLATE 50 MG PO CAPS: 100.0000 mg | ORAL_CAPSULE | Freq: Two times a day (BID) | ORAL | 0 refills | Status: AC

## 2021-01-14 MED ORDER — LIDOCAINE HCL (PF) 1 % IJ SOLN
30.0000 mL | Freq: Once | INTRAMUSCULAR | Status: AC
  Administered 2021-01-14: 30 mL
  Filled 2021-01-14: qty 30

## 2021-01-14 NOTE — Progress Notes (Signed)
  Procedures Note:   Preprocedure diagnosis: Right knee large anterior skin tear Postprocedure diagnosis: same Procedure: irrigation and debridement with laceration repair Procedure details: After informed consent was obtained from the patient, wound was cleaned with iodine and irrigated with 1L of saline. Multiple small pieces of grass and what looked to be human hair were removed from the wound with sterile forceps. 10 cc's of 1% lidocaine were injected superficially to numb the skin along edges of the laceration. At this point 3-0 nylon interrupted sutures were used to close the laceration. Sterile procedure was maintained throughout this process. Skin was cleaned again with sterile gauze and iodine. Dressing of xeroform, sterile gauze, abd's, and 6 inch ace wrap were placed. Patient was placed in a knee immobilizer. She tolerated the procedure well and there were no complications.   I discussed with her infection risk and to call our office should she have fevers, chills, or increased drainage through her dressing. She is today finishing a round of Keflex for right lower leg cellulitis. I will give her further antibiotics at discharge. She can be weight bearing as tolerated but should keep knee immobilizer in place at all times. She takes hydrocodone 5/325 3-4x a day at baseline. I will send her home with a short course of oxycodone for pain, but is aware this should not be used in conjunction with her baseline hydrocodone.   Patient will follow up in our office 9/16 or 9/19.  Merlene Pulling, PA-C 01/14/2021 12:13 PM

## 2021-01-14 NOTE — ED Provider Notes (Signed)
[facility] DEPT Provider Note   CSN: [id number] Arrival date & time: 01/14/21  0330     History Chief Complaint  Patient presents with   Fall   Knee Injury    Kayla [name] is a 80 y.o. female history includes atrial fibrillation on Eliquis, coronary artery disease, carotid artery disease, CHF, COPD, type 2 diabetes, hypertension, GERD, obesity, hyperlipidemia.  80 year old female presented for evaluation of right knee injury and laceration, she tripped last night while helping her husband with his CPAP machine.  Patient fell forward directly onto the right knee.  Patient reports pain was immediate, sharp/burning, moderate intensity, constant, worsens with movement and palpation, improves with rest.  Pain does not radiate.  Patient denies head injury, loss consciousness, headache, neck pain, back pain, abdominal pain, chest pain, numbness/weakness or any additional injuries or concerns.  HPI     Past Medical History:  Diagnosis Date   [facility])    Allergy    Anxiety    Arthritis    CAD (coronary artery disease)    Mild per remote cath in 2006, /    Nuclear, January, 2013, no significant abnormality,   Carotid artery disease ([place])    Doppler, January, 2013, 0 54% RIC A., 65-68% LICA, stable   Cataract    CHF (congestive heart failure) ([place])    Complication of anesthesia    wakes up "mean"   COPD 02/16/2007   Intolerant of Spiriva, tudorza Intolerant of Trelegy    COPD (chronic obstructive pulmonary disease) ([place])    Coronary atherosclerosis 11/24/2008   Catheterization 2006, nonobstructive coronary disease   //   nuclear 2013, low risk     Diabetes mellitus, type 2 ([place])    Diverticula of colon    DIVERTICULOSIS OF COLON 03/23/2007   Qualifier: Diagnosis of  By: Halford Chessman MD, Vineet     Dizziness    occasional   Dizzy spells 05/04/2011   DM (diabetes mellitus) ([place]) 03/23/2007   Qualifier: Diagnosis of  By: Halford Chessman MD, Vineet     Dyspnea     On exertion   Ejection fraction    Essential hypertension 02/16/2007   Qualifier: Diagnosis of  By: Rosana Hoes CMA, Tammy     G E R D 03/23/2007   Qualifier: Diagnosis of  By: Halford Chessman MD, Vineet     Gall bladder disease 04/28/2016   Gastritis and gastroduodenitis    GERD (gastroesophageal reflux disease)    Goiter    hx of   Headache(784.0)    migraine   Hyperlipidemia    Hypertension    Hypothyroidism    HYPOTHYROIDISM, POSTSURGICAL 06/04/2008   Qualifier: Diagnosis of  By: Loanne Drilling MD, Sean A    Left ventricular hypertrophy    Leg edema    occasional swelling   Leg swelling 12/09/2018   Myofascial pain syndrome, cervical 01/06/2018   Obesity    OBESITY 11/24/2008   Qualifier: Diagnosis of  By: Sidney Ace     OBSTRUCTIVE SLEEP APNEA 02/16/2007   Auto CPAP 09/03/13 to 10/02/13 >> used on 30 of 30 nights with average 6 hrs and 3 min.  Average AHI is 3.1 with median CPAP 5 cm H2O and 95 th percentile CPAP 6 cm H2O.     Occipital neuralgia of right side 01/06/2018   OSA (obstructive sleep apnea)    cpap setting of 12   Pain in joint of right hip 01/21/2018   Palpitations 01/21/2011   48 hour Holter, 2015, scattered  PACs and PVCs.    Pneumonia    PONV (postoperative nausea and vomiting)    severe after every surgery   Pulmonary hypertension, secondary    RHINITIS 07/21/2010        RUQ abdominal pain    S/P left TKA 07/27/2011   Sinus node dysfunction ([place]) 11/08/2015   Sleep apnea    Spinal stenosis of cervical region 11/07/2014   Urinary frequency 12/09/2018   UTI (urinary tract infection) 08/15/2019   per pt report   VENTRICULAR HYPERTROPHY, LEFT 11/24/2008   Qualifier: Diagnosis of  By: Sidney Ace      Patient Active Problem List   Diagnosis Date Noted   Diastolic heart failure ([place]) 10/16/2020   Acute on chronic diastolic (congestive) heart failure ([place]) 09/27/2020   Polymyalgia rheumatica ([place]) 04/15/2020   Temporal arteritis ([place]) 04/15/2020   Pacemaker - MDT 01/24/2020    Orthostatic lightheadedness 01/24/2020   Chronic osteomyelitis involving left ankle and foot (HCC)    Paroxysmal A-fib ([place]) 10/30/2019   Paroxysmal atrial fibrillation ([place]) 10/25/2019   Secondary hypercoagulable state ([place]) 10/25/2019   Numbness and tingling of right leg 10/19/2019   Lumbar radiculopathy 10/19/2019   Acute exacerbation of CHF (congestive heart failure) ([place]) 08/13/2019   COPD with acute exacerbation ([place]) 08/13/2019   Acute respiratory failure ([place]) 08/13/2019   Chest pain 08/13/2019   Leukocytosis 08/13/2019   Leg swelling 12/09/2018   Urinary frequency 12/09/2018   Pain in joint of right hip 01/21/2018   Myofascial pain syndrome, cervical 01/06/2018   Occipital neuralgia of right side 01/06/2018   RUQ abdominal pain    Gastritis and gastroduodenitis    Bile duct abnormality    Cholecystitis 04/30/2016   Gall bladder disease 04/28/2016   Sinus node dysfunction ([place]) 11/08/2015   Ejection fraction    Spinal stenosis of cervical region 11/07/2014   Preoperative clearance 10/09/2014   Carotid artery disease ([place])    S/P left TKA 07/27/2011   Dizzy spells 05/04/2011   Palpitations 01/21/2011   RHINITIS 07/21/2010   Hyperlipidemia 11/24/2008   OBESITY 11/24/2008   Coronary atherosclerosis 11/24/2008   VENTRICULAR HYPERTROPHY, LEFT 11/24/2008   HYPOTHYROIDISM, POSTSURGICAL 06/04/2008   Headache 06/04/2008   DM (diabetes mellitus) ([place]) 03/23/2007   G E R D 03/23/2007   DIVERTICULOSIS OF COLON 03/23/2007   GENERALIZED OSTEOARTHROSIS UNSPECIFIED SITE 03/23/2007   OBSTRUCTIVE SLEEP APNEA 02/16/2007   Essential hypertension 02/16/2007   COPD 02/16/2007    Past Surgical History:  Procedure Laterality Date   ABDOMINAL HYSTERECTOMY     with appendectomy and colon polypectomy   AMPUTATION TOE Left 12/15/2019   Procedure: AMPUTATION  LEFT GREAT TOE;  Surgeon: Evelina Bucy, DPM;  Location: [facility];  Service: Podiatry;  Laterality: Left;   ANTERIOR CERVICAL  DECOMP/DISCECTOMY FUSION N/A 11/07/2014   Procedure: Anterior Cervical diskectomy and fusion Cervical four-five, Cervical five-six, Cervical six-seven;  Surgeon: Kary Kos, MD;  Location: [facility];  Service: Neurosurgery;  Laterality: N/A;   APPENDECTOMY     ARTERY BIOPSY Right 02/16/2017   Procedure: BIOPSY TEMPORAL ARTERY;  Surgeon: Katha Cabal, MD;  Location: [facility] ORS;  Service: Vascular;  Laterality: Right;   ARTERY BIOPSY Left 04/16/2020   Procedure: BIOPSY TEMPORAL ARTERY;  Surgeon: Katha Cabal, MD;  Location: [facility] ORS;  Service: Vascular;  Laterality: Left;  LEFT TEMPLE   BACK SURGERY     CARDIAC CATHETERIZATION     CATARACT EXTRACTION W/ INTRAOCULAR LENS  IMPLANT, BILATERAL Bilateral  CHOLECYSTECTOMY N/A 04/29/2016   Procedure: LAPAROSCOPIC CHOLECYSTECTOMY WITH INTRAOPERATIVE CHOLANGIOGRAM;  Surgeon: Alphonsa Overall, MD;  Location: [facility];  Service: General;  Laterality: N/A;   EP IMPLANTABLE DEVICE N/A 11/08/2015   MRI COMPATABLE -- Procedure: Pacemaker Implant;  Surgeon: Deboraha Sprang, MD;  Location: [facility] CV LAB;  Service: Cardiovascular;  Laterality: N/A;   INSERT / REPLACE / REMOVE PACEMAKER  2017   Dr. Jens Som [facility])   JOINT REPLACEMENT     LUMBAR [place] SURGERY  1990's   x 2   THYROIDECTOMY  2011   TOTAL KNEE ARTHROPLASTY  07/27/2011   Procedure: TOTAL KNEE ARTHROPLASTY;  Surgeon: Mauri Pole, MD;  Location: [facility];  Service: Orthopedics;  Laterality: Left;   UPPER ESOPHAGEAL ENDOSCOPIC ULTRASOUND (EUS) N/A 05/14/2016   Procedure: UPPER ESOPHAGEAL ENDOSCOPIC ULTRASOUND (EUS);  Surgeon: Milus Banister, MD;  Location: [facility];  Service: Endoscopy;  Laterality: N/A;  radial only-will be done mod if needed     OB History   No obstetric history on file.     Family History  Problem Relation Age of Onset   Heart Problems Mother    Heart defect Mother        valve problems   Heart Problems Father    Heart disease Brother    Lung cancer  Daughter        non small cell    Diabetes Maternal Aunt    Diabetes Maternal Uncle    Throat cancer Brother    Colon cancer Neg Hx    Colon polyps Neg Hx    Esophageal cancer Neg Hx    Rectal cancer Neg Hx    Stomach cancer Neg Hx     Social History   Tobacco Use   Smoking status: Former    Packs/day: 2.00    Years: 33.00    Pack years: 66.00    Types: Cigarettes    Start date: 1966    Quit date: 05/04/1997    Years since quitting: 23.7   Smokeless tobacco: Never  Vaping Use   Vaping Use: Never used  Substance Use Topics   Alcohol use: No   Drug use: No    Home Medications Prior to Admission medications   Medication Sig Start Date End Date Taking? Authorizing Provider  acetaminophen (TYLENOL) 500 MG tablet Take 1,000 mg by mouth every 6 (six) hours as needed for moderate pain (for headaches).    Yes [provider]  albuterol (PROVENTIL) (2.5 MG/3ML) 0.083% nebulizer solution Take 3 mLs (2.5 mg total) by nebulization every 2 (two) hours as needed for wheezing or shortness of breath. 05/15/20  Yes Chesley Mires, MD  albuterol (VENTOLIN HFA) 108 (90 Base) MCG/ACT inhaler Inhale 2 puffs into the lungs every 6 (six) hours as needed for wheezing or shortness of breath. 05/15/20  Yes Chesley Mires, MD  allopurinol (ZYLOPRIM) 100 MG tablet Take 100 mg by mouth 2 (two) times daily. 04/16/19  Yes [provider]  amiodarone (PACERONE) 200 MG tablet Take 1 tablet (200 mg total) by mouth daily. 07/09/20  Yes Deboraha Sprang, MD  amLODipine (NORVASC) 5 MG tablet Take 5 mg by mouth daily.   Yes [provider]  ascorbic acid (VITAMIN C) 500 MG tablet Take 500 mg by mouth every morning.   Yes [provider]  atorvastatin (LIPITOR) 40 MG tablet TAKE 1 TABLET EVERY DAY Patient taking differently: Take 40 mg by mouth daily. 10/23/20  Yes Jerline Pain, MD  benzonatate (TESSALON) 200 MG capsule Take 1 capsule (200 mg total) by mouth 3 (three) times daily as  needed for cough. 10/16/20 10/16/21 Yes Parrett, Tammy S, NP  Budeson-Glycopyrrol-Formoterol (BREZTRI AEROSPHERE) 160-9-4.8 MCG/ACT AERO Inhale 2 puffs into the lungs in the morning and at bedtime. 06/17/20  Yes Chesley Mires, MD  cephALEXin (KEFLEX) 250 MG capsule Take 1 capsule (250 mg total) by mouth every 8 (eight) hours. 01/09/21  Yes Deboraha Sprang, MD  Dextromethorphan-guaiFENesin (TUSSIN DM MAX SUGAR-FREE PO) Take 5 mLs by mouth every 4 (four) hours as needed (for coughing).   Yes [provider]  doxycycline (VIBRAMYCIN) 50 MG capsule Take 2 capsules (100 mg total) by mouth 2 (two) times daily for 10 days. 01/14/21 01/24/21 Yes Brown, Blaine K, PA-C  ELIQUIS 5 MG TABS tablet TAKE 1 TABLET TWICE DAILY Patient taking differently: Take 5 mg by mouth 2 (two) times daily. 09/09/20  Yes Deboraha Sprang, MD  ferrous sulfate 325 (65 FE) MG tablet Take 1 tablet (325 mg total) by mouth daily with breakfast. 01/26/20  Yes Deboraha Sprang, MD  fluticasone [facility]) 50 MCG/ACT nasal spray USE 2 SPRAYS NASALLY DAILY Patient taking differently: Place 2 sprays into both nostrils daily as needed for allergies or rhinitis. 02/26/17  Yes Collene Gobble, MD  gabapentin (NEURONTIN) 800 MG tablet Take 800 mg by mouth 4 (four) times daily as needed (neuropathy pain). 10/29/20  Yes [provider]  insulin aspart (NOVOLOG) 100 UNIT/ML FlexPen Inject 30-70 Units into the skin 3 (three) times daily with meals. Per sliding scale (and an additional 1 unit for every 50 points for a BGL >130) Sliding scale   Yes [provider]  insulin glargine (LANTUS) 100 UNIT/ML injection Inject 110 Units into the skin in the morning and at bedtime.   Yes [provider]  levothyroxine (SYNTHROID) 112 MCG tablet Take 112 mcg by mouth daily. 08/02/20  Yes [provider]  LORazepam (ATIVAN) 2 MG tablet Take 2 mg by mouth at bedtime as needed for anxiety or sleep. 08/20/14  Yes [provider]   metolazone (ZAROXOLYN) 2.5 MG tablet Take Metolazone 2.5mg  - 1 tablet by mouth on 01/10/2021 and 01/13/2021 01/09/21  Yes Deboraha Sprang, MD  metoprolol succinate (TOPROL-XL) 100 MG 24 hr tablet TAKE 1 TABLET EVERY DAY WITH OR IMMEDIATELY AFTER A MEAL Patient taking differently: Take 100 mg by mouth daily. 11/14/20  Yes Deboraha Sprang, MD  naloxone [facility]) nasal spray 4 mg/0.1 mL Place 4 mg into the nose as needed for opioid reversal. 12/04/20  Yes [provider]  nitroGLYCERIN (NITROSTAT) 0.4 MG SL tablet Place 1 tablet (0.4 mg total) under the tongue every 5 (five) minutes as needed for chest pain. 04/13/19  Yes Jerline Pain, MD  oxyCODONE (ROXICODONE) 5 MG immediate release tablet Take 1 tablet (5 mg total) by mouth every 6 (six) hours as needed for severe pain. 01/14/21  Yes Merlene Pulling K, PA-C  PRESCRIPTION MEDICATION CPAP: At bedtime and "as needed for shortness of breath"   Yes [provider]  torsemide (DEMADEX) 20 MG tablet Take 3 tablets (60 mg total) by mouth every other day. Patient taking differently: Take 80 mg by mouth daily. 07/09/20  Yes Deboraha Sprang, MD  vitamin B-12 (CYANOCOBALAMIN) 500 MCG tablet Take 500 mcg by mouth every morning.   Yes [provider]  Vitamin D3 (VITAMIN D) 25 MCG tablet Take 1,000 Units by mouth every morning.  Yes [provider]  Alcohol Swabs (B-D SINGLE USE SWABS REGULAR) PADS  11/24/19   [provider]  Blood Glucose Calibration (TRUE METRIX LEVEL 1) Low SOLN  11/27/19   [provider]  TRUE METRIX BLOOD GLUCOSE TEST test strip  09/18/17   [provider]  TRUEplus Lancets 28G [place]  11/24/19   [provider]    Allergies    Dulaglutide  Review of Systems   Review of Systems Ten systems are reviewed and are negative for acute change except as noted in the HPI  Physical Exam Updated Vital Signs BP (!) 148/54 (BP Location: Left Arm)   Pulse 63   Temp 97.9 F (36.6 C)  (Oral)   Resp 18   Ht 5\' 5"  (1.651 m)   Wt 112.5 kg   SpO2 98%   BMI 41.27 kg/m   Physical Exam Constitutional:      General: She is not in acute distress.    Appearance: Normal appearance. She is well-developed. She is not ill-appearing or diaphoretic.  HENT:     Head: Normocephalic and atraumatic.  Eyes:     General: Vision grossly intact. Gaze aligned appropriately.     Pupils: Pupils are equal, round, and reactive to light.  Neck:     Trachea: Trachea and phonation normal.  Pulmonary:     Effort: Pulmonary effort is normal. No respiratory distress.  Abdominal:     General: There is no distension.     Palpations: Abdomen is soft.     Tenderness: There is no abdominal tenderness. There is no guarding or rebound.  Musculoskeletal:        General: Normal range of motion.     Cervical back: Normal range of motion.     Comments: 10 cm laceration overlying the superior right knee as seen in picture below.  Subcutaneous tissue present in wound.  No active bleeding.  Active ROM of the right knee maintained but with increased pain.  Neurovascular intact distally.  Skin:    General: Skin is warm and dry.  Neurological:     Mental Status: She is alert.     GCS: GCS eye subscore is 4. GCS verbal subscore is 5. GCS motor subscore is 6.     Comments: Speech is clear and goal oriented, follows commands Major Cranial nerves without deficit, no facial droop Moves extremities without ataxia, coordination intact  Psychiatric:        Behavior: Behavior normal.     ED Results / Procedures / Treatments   Labs (all labs ordered are listed, but only abnormal results are displayed) Labs Reviewed - No data to display  EKG None  Radiology DG Knee Complete 4 Views Right  Result Date: 01/14/2021 CLINICAL DATA:  Recent fall with laceration in the suprapatellar region EXAM: RIGHT KNEE - COMPLETE 4+ VIEW COMPARISON:  05/28/2020 FINDINGS: Tricompartmental degenerative changes are noted. No  acute fracture or dislocation is noted. No joint effusion is seen. Soft tissue laceration is noted anteriorly over the patella. IMPRESSION: Degenerative change with soft tissue injury. No acute bony abnormality is noted. Electronically Signed   By: Inez Catalina M.D.   On: 01/14/2021 08:14    Procedures Procedures   Medications Ordered in ED Medications  morphine 2 MG/ML injection 2 mg (2 mg Intravenous Given 01/14/21 0825)  lidocaine (PF) (XYLOCAINE) 1 % injection 30 mL (30 mLs Infiltration Given 01/14/21 1204)  morphine 2 MG/ML injection 2 mg (2 mg Intravenous Given 01/14/21 1028)  ED Course  I have reviewed the triage vital signs and the nursing notes.  Pertinent labs & imaging results that were available during my care of the patient were reviewed by me and considered in my medical decision making (see chart for details).  Clinical Course as of 01/14/21 1252  Tue Jan 14, 2021  0831 OR RN [BM]  66 Dr. Mardelle Matte [BM]  [place] PA-C [BM]    Clinical Course User Index [BM] Gari Crown   MDM Rules/Calculators/A&P                           Additional history obtained from: Nursing notes from this visit. Review of electronic medical records. ------------ 80 year old female presented for evaluation of right knee injury.  She tripped and fell last night, has a large laceration overlying the right knee as seen in picture above.  She is neurovascular intact distally.  She denied any injury of the head neck back chest abdomen or pain of those areas to indicate CT imaging.  X-ray obtained of the right knee shows arthritis, no evidence for acute fracture or dislocation.  Pain medication was ordered and consult was placed to orthopedic specialist. - Consulted with orthopedic specialist Dr. Mardelle Matte who saw and evaluated the patient, advised that the patient go to the OR for washout and cleanout but patient declined.  Orthopedic PA is planning for cleaning and repair here in the  ED. - Laceration was repaired by orthopedic physician assistant, patient recently finishing a Keflex regimen by PCP, she has been started on Bactrim by orthopedist.  Additionally pain medication called in by orthopedist.   - I reassessed the patient after wound closure, she is neurovascularly intact.  She is in a right knee immobilizer.  She has a walker at home to help with ambulation.  She will follow-up with orthopedist in 3 days for reassessment.  Patient denies any additional injuries or concerns at this time she is pleasant well-appearing and in no acute distress.  Vital signs are stable on room air.   At this time there does not appear to be any evidence of an acute emergency medical condition and the patient appears stable for discharge with appropriate outpatient follow up. Diagnosis was discussed with patient who verbalizes understanding of care plan and is agreeable to discharge. I have discussed return precautions with patient who verbalizes understanding. Patient encouraged to follow-up with their PCP. All questions answered.  Patient seen and evaluated by Dr. Armandina Gemma who agrees with plan to discharge with follow-up.   Note: Portions of this report may have been transcribed using voice recognition software. Every effort was made to ensure accuracy; however, inadvertent computerized transcription errors may still be present.  Final Clinical Impression(s) / ED Diagnoses Final diagnoses:  Laceration of right knee, initial encounter    Rx / DC Orders ED Discharge Orders          Ordered    oxyCODONE (ROXICODONE) 5 MG immediate release tablet  Every 6 hours PRN        01/14/21 1226    doxycycline (VIBRAMYCIN) 50 MG capsule  2 times daily        01/14/21 [address] 01/14/21 1253    Regan Lemming, MD 01/14/21 1401

## 2021-01-14 NOTE — Discharge Instructions (Addendum)
You may weight bear as tolerated on right leg, but leg must remain straight at all times. Keep knee immobilizer in place.   ============================  At this time there does not appear to be the presence of an emergent medical condition, however there is always the potential for conditions to change. Please read and follow the below instructions.  Please return to the Emergency Department immediately for any new or worsening symptoms. Please be sure to follow up with your Primary Care Provider within one week regarding your visit today; please call their office to schedule an appointment even if you are feeling better for a follow-up visit. Please take your antibiotic as prescribed by the orthopedic specialist.  Please keep your wound clean and covered with sterile dressing. You may use the pain medication Percocet (Oxycodone/Acetaminophen) as prescribed for severe pain.  Percocet will make you drowsy so do not drive, drink alcohol, take other sedating medications or perform any dangerous activities such as driving after taking Percocet.  Percocet contains Tylenol (acetaminophen) so do not take any other medications containing Tylenol with Percocet.   Go to the nearest Emergency Department immediately if: You have fever or chills You have very bad swelling around the wound. Your pain suddenly gets worse and is very bad. You have painful lumps near the wound or on skin anywhere on your body. You have a red streak going away from your wound. The wound is on your hand or foot, and: You cannot move a finger or toe. Your fingers or toes look pale or bluish. You have any new/concerning or worsening of symptoms  Please read the additional information packets attached to your discharge summary.  Do not take your medicine if  develop an itchy rash, swelling in your mouth or lips, or difficulty breathing; call 911 and seek immediate emergency medical attention if this occurs.  You may review  your lab tests and imaging results in their entirety on your MyChart account.  Please discuss all results of fully with your primary care provider and other specialist at your follow-up visit.  Note: Portions of this text may have been transcribed using voice recognition software. Every effort was made to ensure accuracy; however, inadvertent computerized transcription errors may still be present.

## 2021-01-14 NOTE — Consult Note (Signed)
ORTHOPAEDIC CONSULTATION  REQUESTING PHYSICIAN: Regan Lemming, MD  Chief Complaint: Right knee skin tear  HPI: Kayla [name] is a 80 y.o. female who complains of acute pain over the right anterior knee, with a large skin tear that occurred after she tripped over a rug this morning.  Patient denies any loss of consciousness.  She has pre-existing lymphedema/cellulitis of the right lower extremity that she is currently taking antibiotics for given to her by her cardiologist, Dr. Caryl Comes.  Patient denies any other major injuries.  Past Medical History:  Diagnosis Date   A-fib [facility])    Allergy    Anxiety    Arthritis    CAD (coronary artery disease)    Mild per remote cath in 2006, /    Nuclear, January, 2013, no significant abnormality,   Carotid artery disease ([place])    Doppler, January, 2013, 0 51% RIC A., 70-01% LICA, stable   Cataract    CHF (congestive heart failure) ([place])    Complication of anesthesia    wakes up "mean"   COPD 02/16/2007   Intolerant of Spiriva, tudorza Intolerant of Trelegy    COPD (chronic obstructive pulmonary disease) ([place])    Coronary atherosclerosis 11/24/2008   Catheterization 2006, nonobstructive coronary disease   //   nuclear 2013, low risk     Diabetes mellitus, type 2 ([place])    Diverticula of colon    DIVERTICULOSIS OF COLON 03/23/2007   Qualifier: Diagnosis of  By: Halford Chessman MD, Vineet     Dizziness    occasional   Dizzy spells 05/04/2011   DM (diabetes mellitus) ([place]) 03/23/2007   Qualifier: Diagnosis of  By: Halford Chessman MD, Vineet     Dyspnea    On exertion   Ejection fraction    Essential hypertension 02/16/2007   Qualifier: Diagnosis of  By: Rosana Hoes CMA, Tammy     G E R D 03/23/2007   Qualifier: Diagnosis of  By: Halford Chessman MD, Vineet     Gall bladder disease 04/28/2016   Gastritis and gastroduodenitis    GERD (gastroesophageal reflux disease)    Goiter    hx of   Headache(784.0)    migraine   Hyperlipidemia    Hypertension    Hypothyroidism     HYPOTHYROIDISM, POSTSURGICAL 06/04/2008   Qualifier: Diagnosis of  By: Loanne Drilling MD, Sean A    Left ventricular hypertrophy    Leg edema    occasional swelling   Leg swelling 12/09/2018   Myofascial pain syndrome, cervical 01/06/2018   Obesity    OBESITY 11/24/2008   Qualifier: Diagnosis of  By: Sidney Ace     OBSTRUCTIVE SLEEP APNEA 02/16/2007   Auto CPAP 09/03/13 to 10/02/13 >> used on 30 of 30 nights with average 6 hrs and 3 min.  Average AHI is 3.1 with median CPAP 5 cm H2O and 95 th percentile CPAP 6 cm H2O.     Occipital neuralgia of right side 01/06/2018   OSA (obstructive sleep apnea)    cpap setting of 12   Pain in joint of right hip 01/21/2018   Palpitations 01/21/2011   48 hour Holter, 2015, scattered PACs and PVCs.    Pneumonia    PONV (postoperative nausea and vomiting)    severe after every surgery   Pulmonary hypertension, secondary    RHINITIS 07/21/2010        RUQ abdominal pain    S/P left TKA 07/27/2011   Sinus node dysfunction (HCC) 11/08/2015   Sleep apnea  Spinal stenosis of cervical region 11/07/2014   Urinary frequency 12/09/2018   UTI (urinary tract infection) 08/15/2019   per pt report   VENTRICULAR HYPERTROPHY, LEFT 11/24/2008   Qualifier: Diagnosis of  By: Sidney Ace     Past Surgical History:  Procedure Laterality Date   ABDOMINAL HYSTERECTOMY     with appendectomy and colon polypectomy   AMPUTATION TOE Left 12/15/2019   Procedure: AMPUTATION  LEFT GREAT TOE;  Surgeon: Evelina Bucy, DPM;  Location: [facility];  Service: Podiatry;  Laterality: Left;   ANTERIOR CERVICAL DECOMP/DISCECTOMY FUSION N/A 11/07/2014   Procedure: Anterior Cervical diskectomy and fusion Cervical four-five, Cervical five-six, Cervical six-seven;  Surgeon: Kary Kos, MD;  Location: [place] [facility];  Service: Neurosurgery;  Laterality: N/A;   APPENDECTOMY     ARTERY BIOPSY Right 02/16/2017   Procedure: BIOPSY TEMPORAL ARTERY;  Surgeon: Katha Cabal, MD;  Location: [facility] ORS;  Service:  Vascular;  Laterality: Right;   ARTERY BIOPSY Left 04/16/2020   Procedure: BIOPSY TEMPORAL ARTERY;  Surgeon: Katha Cabal, MD;  Location: [facility] ORS;  Service: Vascular;  Laterality: Left;  LEFT TEMPLE   BACK SURGERY     CARDIAC CATHETERIZATION     CATARACT EXTRACTION W/ INTRAOCULAR LENS  IMPLANT, BILATERAL Bilateral    CHOLECYSTECTOMY N/A 04/29/2016   Procedure: LAPAROSCOPIC CHOLECYSTECTOMY WITH INTRAOPERATIVE CHOLANGIOGRAM;  Surgeon: Alphonsa Overall, MD;  Location: [facility];  Service: General;  Laterality: N/A;   EP IMPLANTABLE DEVICE N/A 11/08/2015   MRI COMPATABLE -- Procedure: Pacemaker Implant;  Surgeon: Deboraha Sprang, MD;  Location: [place];  Service: Cardiovascular;  Laterality: N/A;   INSERT / REPLACE / REMOVE PACEMAKER  2017   Dr. Jens Som [facility])   JOINT REPLACEMENT     LUMBAR Imogene SURGERY  1990's   x 2   THYROIDECTOMY  2011   TOTAL KNEE ARTHROPLASTY  07/27/2011   Procedure: TOTAL KNEE ARTHROPLASTY;  Surgeon: Mauri Pole, MD;  Location: [facility];  Service: Orthopedics;  Laterality: Left;   UPPER ESOPHAGEAL ENDOSCOPIC ULTRASOUND (EUS) N/A 05/14/2016   Procedure: UPPER ESOPHAGEAL ENDOSCOPIC ULTRASOUND (EUS);  Surgeon: Milus Banister, MD;  Location: [facility];  Service: Endoscopy;  Laterality: N/A;  radial only-will be done mod if needed   Social History   Socioeconomic History   Marital status: Married    Spouse name: Jeneen Rinks   Number of children: 3   Years of education: 12   Highest education level: Not on file  Occupational History   Occupation: retired  Tobacco Use   Smoking status: Former    Packs/day: 2.00    Years: 33.00    Pack years: 66.00    Types: Cigarettes    Start date: 1966    Quit date: 05/04/1997    Years since quitting: 23.7   Smokeless tobacco: Never  Vaping Use   Vaping Use: Never used  Substance and Sexual Activity   Alcohol use: No   Drug use: No   Sexual activity: Not on file  Other Topics Concern   Not on file  Social History  Narrative   Patient lives at home with her husband Jeneen Rinks .   Retired.   Education 12 th grade.   Right handed   Caffeine two cups of coffee.   Social Determinants of Health   Financial Resource Strain: Not on file  Food Insecurity: Not on file  Transportation Needs: Not on file  Physical Activity: Not on file  Stress: Not on file  Social Connections: Not on file   Family History  Problem Relation Age of Onset   Heart Problems Mother    Heart defect Mother        valve problems   Heart Problems Father    Heart disease Brother    Lung cancer Daughter        non small cell    Diabetes Maternal Aunt    Diabetes Maternal Uncle    Throat cancer Brother    Colon cancer Neg Hx    Colon polyps Neg Hx    Esophageal cancer Neg Hx    Rectal cancer Neg Hx    Stomach cancer Neg Hx    Allergies  Allergen Reactions   Dulaglutide Nausea Only, Other (See Comments) and Cough    Made the patient sick to her stomach (only) several days after using it Trulicity     Positive ROS: All other systems have been reviewed and were otherwise negative with the exception of those mentioned in the HPI and as above.  Physical Exam: General: Alert, no acute distress Cardiovascular: She has moderate bilateral pedal edema Respiratory: No cyanosis, no use of accessory musculature GI: No organomegaly, abdomen is soft and non-tender Skin: She has a large transverse laceration overlying the anterior knee, with exposed subcutaneous fat as well as the anterior portion of the patella fascia Neurologic: Sensation intact distally Psychiatric: Patient is competent for consent with normal mood and affect Lymphatic: No axillary or cervical lymphadenopathy  MUSCULOSKELETAL: Straight leg raises intact, on the right side EHL and FHL are intact, she does have some skin lesions on both legs, with some cellulitic changes in the right lower extremity below the tibia.      Assessment: Right knee large anterior  skin tear, coexisting lymphedema and cellulitis below secondary to cardiac issues  Plan: This is an acute significant injury, and I discussed the options with her.  I recommended surgical washout, with repair of the skin.  I do not think she has any other injuries deep to the skin and subcutaneous tissue.  She however has declined the operating room offer, and wishes to have this sutured in the emergency room.  I discussed the risks for infection, worsening of the wound breakdown, among others, and already seeing some element of duskiness over the anterior skin on the distal side.  Plan for emergency room washout with repair of the skin, according to the patient's wishes, oral antibiotics, knee immobilizer to help take tension off of the wound, and close follow-up in the office.   Johnny Bridge, MD Cell [phone]   01/14/2021 10:02 AM

## 2021-01-14 NOTE — ED Notes (Signed)
Ortho called 

## 2021-01-14 NOTE — ED Triage Notes (Signed)
Pt states that she fell and caused a skin tear to her right knee. Pt has a scrape to her left knee as well.

## 2021-01-15 ENCOUNTER — Other Ambulatory Visit: Payer: Medicare HMO | Admitting: *Deleted

## 2021-01-15 ENCOUNTER — Other Ambulatory Visit: Payer: Self-pay | Admitting: Pulmonary Disease

## 2021-01-15 ENCOUNTER — Telehealth: Payer: Self-pay | Admitting: Internal Medicine

## 2021-01-15 DIAGNOSIS — Z950 Presence of cardiac pacemaker: Secondary | ICD-10-CM

## 2021-01-15 DIAGNOSIS — Z79899 Other long term (current) drug therapy: Secondary | ICD-10-CM | POA: Diagnosis not present

## 2021-01-15 DIAGNOSIS — I480 Paroxysmal atrial fibrillation: Secondary | ICD-10-CM

## 2021-01-15 DIAGNOSIS — I495 Sick sinus syndrome: Secondary | ICD-10-CM

## 2021-01-15 LAB — COMPREHENSIVE METABOLIC PANEL
ALT: 12 IU/L (ref 0–32)
AST: 16 IU/L (ref 0–40)
Albumin/Globulin Ratio: 1.5 (ref 1.2–2.2)
Albumin: 3.7 g/dL (ref 3.7–4.7)
Alkaline Phosphatase: 77 IU/L (ref 44–121)
BUN/Creatinine Ratio: 24 (ref 12–28)
BUN: 51 mg/dL — ABNORMAL HIGH (ref 8–27)
Bilirubin Total: 0.3 mg/dL (ref 0.0–1.2)
CO2: 25 mmol/L (ref 20–29)
Calcium: 9.2 mg/dL (ref 8.7–10.3)
Chloride: 95 mmol/L — ABNORMAL LOW (ref 96–106)
Creatinine, Ser: 2.12 mg/dL — ABNORMAL HIGH (ref 0.57–1.00)
Globulin, Total: 2.5 g/dL (ref 1.5–4.5)
Glucose: 138 mg/dL — ABNORMAL HIGH (ref 65–99)
Potassium: 4.5 mmol/L (ref 3.5–5.2)
Sodium: 138 mmol/L (ref 134–144)
Total Protein: 6.2 g/dL (ref 6.0–8.5)
eGFR: 23 mL/min/{1.73_m2} — ABNORMAL LOW (ref >59)

## 2021-01-15 LAB — CBC
Hematocrit: 34.3 % (ref 34.0–46.6)
Hemoglobin: 11.2 g/dL (ref 11.1–15.9)
MCH: 29.5 pg (ref 26.6–33.0)
MCHC: 32.7 g/dL (ref 31.5–35.7)
MCV: 90 fL (ref 79–97)
Platelets: 387 10*3/uL (ref 150–450)
RBC: 3.8 x10E6/uL (ref 3.77–5.28)
RDW: 13.6 % (ref 11.7–15.4)
WBC: 10.1 10*3/uL (ref 3.4–10.8)

## 2021-01-15 LAB — TSH: TSH: 0.524 u[IU]/mL (ref 0.450–4.500)

## 2021-01-15 NOTE — Telephone Encounter (Signed)
Left message for patient to call back  

## 2021-01-15 NOTE — Telephone Encounter (Signed)
Pt is scheduled to come in for labs this morning at 11:45 am, pt would like for Dr. Olin Pia nurse to come to the waiting room or to  the lab when she arrives.  No further information was given by pt. Please advise pt further

## 2021-01-15 NOTE — Telephone Encounter (Signed)
Pt came in today for labwork and was asking to talk with Rosann Auerbach Dr. Olin Pia nurse but she is not here today.. I saw the pt and she reports that she was suppose to report to Dr. Caryl Comes yesterday but she was in the ED yesterday after a fall that lacerated her knee and she had to have stitches.   She says that last Friday Dr. Caryl Comes had assessed her right leg edema and redness and he started her on Antibiotics and added fluid pills but she needed to let him know that it was not working that there has been no change at all... she is now on stinger antibiotic due to the laceration  yesterday.   She would like for me to message Rosann Auerbach since Dr. Caryl Comes is now off starting today but he had mentioned a plan if the meds did not work.  From OV: She is significantly volume overloaded with symptoms of congestive heart failure.  She has a secondary lower extremity cellulitis.  We will push her diuretic; however, we do it with some reluctance because of her renal insufficiency.  We will change her Demadex 60--80 daily and tomorrow and 3 days from now we will have her take metolazone 2.5.  We will check blood work next Wednesday.  If we are unable to diurese her orally we will have to admit her to hospital for intravenous diuretics.  According to my observation when talking with her... the ankle is still swollen, red, and pitting +1... she denies pain.. I could not see the rest of her leg since she was wearing a brace to keep the knee from the fall yesterday stable.   Pt did not want to wait here ling she asked to go home since she was exhausted from being I the ED all day yesterday and asked of we could just call her.

## 2021-01-15 NOTE — Telephone Encounter (Signed)
Spoke with pt and advised of [name], PA-C comments as below.  Pt states she declines to go back to ED right now since she was just there yesterday for her knee laceration.  Pt states she was prescribed Doxycycline and is hoping this will help with the cellulitis that remains from last week.  She reports she completed Keflex and there is some minimal improvement in the redness of her leg.  She has taken diuretics as prescribed by Dr Caryl Comes and states there is mild improvement in her SOB as well.  Pt would like to continue on her Torsemide 80mg  and continue to monitor things for now.  She states she received 22 stitches in her knee she obtained from falling and the ED did not address any other concerns as far as cellulitis or her fluid overload.  She would also like to wait for the results of her labs drawn today at the office. Encouraged pt to do daily weights to assist in monitoring fluid.  Elevate legs as much as possible while sitting.  Continue medications as prescribed.  Will forward to [name], PA-C to make him aware as well as Dr Caryl Comes is out of the office.Reviewed ED precautions with pt who verbalizes understanding.    If she feels it has not improved or is worse, would report to ED for management and admission as recommended by Dr. Caryl Comes.   She is at very high risk of worsening given her CKD and active infection.

## 2021-01-20 DIAGNOSIS — G4733 Obstructive sleep apnea (adult) (pediatric): Secondary | ICD-10-CM | POA: Diagnosis not present

## 2021-01-20 DIAGNOSIS — S81811D Laceration without foreign body, right lower leg, subsequent encounter: Secondary | ICD-10-CM | POA: Diagnosis not present

## 2021-01-21 DIAGNOSIS — H353132 Nonexudative age-related macular degeneration, bilateral, intermediate dry stage: Secondary | ICD-10-CM | POA: Diagnosis not present

## 2021-01-21 DIAGNOSIS — E113213 Type 2 diabetes mellitus with mild nonproliferative diabetic retinopathy with macular edema, bilateral: Secondary | ICD-10-CM | POA: Diagnosis not present

## 2021-01-21 DIAGNOSIS — H26493 Other secondary cataract, bilateral: Secondary | ICD-10-CM | POA: Diagnosis not present

## 2021-01-21 DIAGNOSIS — H35033 Hypertensive retinopathy, bilateral: Secondary | ICD-10-CM | POA: Diagnosis not present

## 2021-01-22 DIAGNOSIS — Z6839 Body mass index (BMI) 39.0-39.9, adult: Secondary | ICD-10-CM | POA: Diagnosis not present

## 2021-01-22 DIAGNOSIS — I83891 Varicose veins of right lower extremities with other complications: Secondary | ICD-10-CM | POA: Diagnosis not present

## 2021-01-22 DIAGNOSIS — R609 Edema, unspecified: Secondary | ICD-10-CM | POA: Diagnosis not present

## 2021-01-22 DIAGNOSIS — I5030 Unspecified diastolic (congestive) heart failure: Secondary | ICD-10-CM | POA: Diagnosis not present

## 2021-01-22 DIAGNOSIS — I83019 Varicose veins of right lower extremity with ulcer of unspecified site: Secondary | ICD-10-CM | POA: Diagnosis not present

## 2021-01-22 DIAGNOSIS — E1140 Type 2 diabetes mellitus with diabetic neuropathy, unspecified: Secondary | ICD-10-CM | POA: Diagnosis not present

## 2021-01-22 DIAGNOSIS — L97919 Non-pressure chronic ulcer of unspecified part of right lower leg with unspecified severity: Secondary | ICD-10-CM | POA: Diagnosis not present

## 2021-01-22 DIAGNOSIS — Z139 Encounter for screening, unspecified: Secondary | ICD-10-CM | POA: Diagnosis not present

## 2021-01-24 DIAGNOSIS — G894 Chronic pain syndrome: Secondary | ICD-10-CM | POA: Diagnosis not present

## 2021-01-24 DIAGNOSIS — E349 Endocrine disorder, unspecified: Secondary | ICD-10-CM | POA: Diagnosis not present

## 2021-01-24 DIAGNOSIS — M353 Polymyalgia rheumatica: Secondary | ICD-10-CM | POA: Diagnosis not present

## 2021-01-24 DIAGNOSIS — M8580 Other specified disorders of bone density and structure, unspecified site: Secondary | ICD-10-CM | POA: Diagnosis not present

## 2021-01-24 DIAGNOSIS — Z1389 Encounter for screening for other disorder: Secondary | ICD-10-CM | POA: Diagnosis not present

## 2021-01-24 DIAGNOSIS — M549 Dorsalgia, unspecified: Secondary | ICD-10-CM | POA: Diagnosis not present

## 2021-01-24 DIAGNOSIS — M1710 Unilateral primary osteoarthritis, unspecified knee: Secondary | ICD-10-CM | POA: Diagnosis not present

## 2021-01-24 DIAGNOSIS — Z79899 Other long term (current) drug therapy: Secondary | ICD-10-CM | POA: Diagnosis not present

## 2021-01-26 ENCOUNTER — Other Ambulatory Visit: Payer: Self-pay | Admitting: *Deleted

## 2021-01-26 MED ORDER — ALBUTEROL SULFATE HFA 108 (90 BASE) MCG/ACT IN AERS: 2.0000 | INHALATION_SPRAY | Freq: Four times a day (QID) | RESPIRATORY_TRACT | 0 refills | Status: DC | PRN

## 2021-01-27 ENCOUNTER — Telehealth: Payer: Self-pay

## 2021-01-27 DIAGNOSIS — S81811D Laceration without foreign body, right lower leg, subsequent encounter: Secondary | ICD-10-CM | POA: Diagnosis not present

## 2021-01-27 NOTE — Telephone Encounter (Signed)
-----   Message from Deboraha Sprang, MD sent at 01/25/2021  9:19 AM EDT ----- Please Inform Patient that amiodarone labs are normal but that her renal function continues to be poor   Has she diuresed with the change in regime, if not we should think of admitting her for IV diuresis  MARK  what do you think  Thanks

## 2021-01-27 NOTE — Telephone Encounter (Signed)
Attempted phone call to pt and left voicemail message to contact RN at [phone]. 

## 2021-01-27 NOTE — Telephone Encounter (Signed)
Spoke with pt and advised of lab results per Dr Caryl Comes.  Pt states she is still retaining fluid.  She has been to see her PCP who is now managing her leg that is weeping.  He has wrapped her leg and reports she is to return to his office Wednesday 09/28 for removal of bandage.  She is scheduled to see [name], PA-C on 09/29.  She denies CP or severe SOB at this time. Reviewed ED precautions and will discuss further with Dr Caryl Comes to see how he would like to proceed either by direct admission or through the ED.  Pt verbalizes understanding and agrees with current plan.

## 2021-01-28 NOTE — Telephone Encounter (Signed)
Patient was returning call. Please advise ?

## 2021-01-29 DIAGNOSIS — I5030 Unspecified diastolic (congestive) heart failure: Secondary | ICD-10-CM | POA: Diagnosis not present

## 2021-01-29 DIAGNOSIS — I83891 Varicose veins of right lower extremities with other complications: Secondary | ICD-10-CM | POA: Diagnosis not present

## 2021-01-29 DIAGNOSIS — I83019 Varicose veins of right lower extremity with ulcer of unspecified site: Secondary | ICD-10-CM | POA: Diagnosis not present

## 2021-01-29 DIAGNOSIS — Z6839 Body mass index (BMI) 39.0-39.9, adult: Secondary | ICD-10-CM | POA: Diagnosis not present

## 2021-01-29 DIAGNOSIS — I872 Venous insufficiency (chronic) (peripheral): Secondary | ICD-10-CM | POA: Diagnosis not present

## 2021-01-29 DIAGNOSIS — R609 Edema, unspecified: Secondary | ICD-10-CM | POA: Diagnosis not present

## 2021-01-29 DIAGNOSIS — L97919 Non-pressure chronic ulcer of unspecified part of right lower leg with unspecified severity: Secondary | ICD-10-CM | POA: Diagnosis not present

## 2021-01-29 NOTE — Telephone Encounter (Signed)
Attempted phone calls to pt's home phone and left voicemail message to contact RN at [phone].  Attempted phone call to cell phone and unable to leave voicemail message due to voicemail being full.

## 2021-01-29 NOTE — Progress Notes (Signed)
Electrophysiology Office Note Date: 01/29/2021  ID:  Kayla, [name] March 24, 1941, MRN [id number]  PCP: [name], MD Primary Cardiologist: Candee Furbish, MD Electrophysiologist: Virl Axe, MD   CC: Pacemaker follow-up  Kayla [name] is a 80 y.o. female seen today for Virl Axe, MD for acute visit due to SOB and edema.  Since last being seen in our clinic the patient reports doing about the same. She fell and hit her R knee requires sutures. They were removed this Monday. R leg remains considerably swollen compared to left. Red with blister type lesions and some oozing. She finished keflex several days ago for cellulitis. She has had intermittent tachy-palpitations consistent by device with her history of AF and recent up-tick in burden. She is in NSR today. She is SOB with any activity. She has conversational dyspnea after walking into clinic that improves with rest. She is SOB with changing clothes or bathing and she is orthopneic. She did not notice any improvement with increased torsemide or two doses of metolazone.   Device History: Medtronic Dual Chamber PPM implanted 11/2015 for SND  Past Medical History:  Diagnosis Date   A-fib [facility])    Allergy    Anxiety    Arthritis    CAD (coronary artery disease)    Mild per remote cath in 2006, /    Nuclear, January, 2013, no significant abnormality,   Carotid artery disease ([place])    Doppler, January, 2013, 0 26% RIC A., 83-41% LICA, stable   Cataract    CHF (congestive heart failure) ([place])    Complication of anesthesia    wakes up "mean"   COPD 02/16/2007   Intolerant of Spiriva, tudorza Intolerant of Trelegy    COPD (chronic obstructive pulmonary disease) ([place])    Coronary atherosclerosis 11/24/2008   Catheterization 2006, nonobstructive coronary disease   //   nuclear 2013, low risk     Diabetes mellitus, type 2 ([place])    Diverticula of colon    DIVERTICULOSIS OF COLON 03/23/2007   Qualifier: Diagnosis of  By:  Halford Chessman MD, Vineet     Dizziness    occasional   Dizzy spells 05/04/2011   DM (diabetes mellitus) ([place]) 03/23/2007   Qualifier: Diagnosis of  By: Halford Chessman MD, Vineet     Dyspnea    On exertion   Ejection fraction    Essential hypertension 02/16/2007   Qualifier: Diagnosis of  By: Rosana Hoes CMA, Tammy     G E R D 03/23/2007   Qualifier: Diagnosis of  By: Halford Chessman MD, Vineet     Gall bladder disease 04/28/2016   Gastritis and gastroduodenitis    GERD (gastroesophageal reflux disease)    Goiter    hx of   Headache(784.0)    migraine   Hyperlipidemia    Hypertension    Hypothyroidism    HYPOTHYROIDISM, POSTSURGICAL 06/04/2008   Qualifier: Diagnosis of  By: Loanne Drilling MD, Sean A    Left ventricular hypertrophy    Leg edema    occasional swelling   Leg swelling 12/09/2018   Myofascial pain syndrome, cervical 01/06/2018   Obesity    OBESITY 11/24/2008   Qualifier: Diagnosis of  By: Sidney Ace     OBSTRUCTIVE SLEEP APNEA 02/16/2007   Auto CPAP 09/03/13 to 10/02/13 >> used on 30 of 30 nights with average 6 hrs and 3 min.  Average AHI is 3.1 with median CPAP 5 cm H2O and 95 th percentile CPAP 6 cm H2O.  Occipital neuralgia of right side 01/06/2018   OSA (obstructive sleep apnea)    cpap setting of 12   Pain in joint of right hip 01/21/2018   Palpitations 01/21/2011   48 hour Holter, 2015, scattered PACs and PVCs.    Pneumonia    PONV (postoperative nausea and vomiting)    severe after every surgery   Pulmonary hypertension, secondary    RHINITIS 07/21/2010        RUQ abdominal pain    S/P left TKA 07/27/2011   Sinus node dysfunction ([place]) 11/08/2015   Sleep apnea    Spinal stenosis of cervical region 11/07/2014   Urinary frequency 12/09/2018   UTI (urinary tract infection) 08/15/2019   per pt report   VENTRICULAR HYPERTROPHY, LEFT 11/24/2008   Qualifier: Diagnosis of  By: Sidney Ace     Past Surgical History:  Procedure Laterality Date   ABDOMINAL HYSTERECTOMY     with appendectomy and colon  polypectomy   AMPUTATION TOE Left 12/15/2019   Procedure: AMPUTATION  LEFT GREAT TOE;  Surgeon: Evelina Bucy, DPM;  Location: [facility];  Service: Podiatry;  Laterality: Left;   ANTERIOR CERVICAL DECOMP/DISCECTOMY FUSION N/A 11/07/2014   Procedure: Anterior Cervical diskectomy and fusion Cervical four-five, Cervical five-six, Cervical six-seven;  Surgeon: Kary Kos, MD;  Location: [place] [facility];  Service: Neurosurgery;  Laterality: N/A;   APPENDECTOMY     ARTERY BIOPSY Right 02/16/2017   Procedure: BIOPSY TEMPORAL ARTERY;  Surgeon: Katha Cabal, MD;  Location: [facility] ORS;  Service: Vascular;  Laterality: Right;   ARTERY BIOPSY Left 04/16/2020   Procedure: BIOPSY TEMPORAL ARTERY;  Surgeon: Katha Cabal, MD;  Location: [facility] ORS;  Service: Vascular;  Laterality: Left;  LEFT TEMPLE   BACK SURGERY     CARDIAC CATHETERIZATION     CATARACT EXTRACTION W/ INTRAOCULAR LENS  IMPLANT, BILATERAL Bilateral    CHOLECYSTECTOMY N/A 04/29/2016   Procedure: LAPAROSCOPIC CHOLECYSTECTOMY WITH INTRAOPERATIVE CHOLANGIOGRAM;  Surgeon: Alphonsa Overall, MD;  Location: [facility];  Service: General;  Laterality: N/A;   EP IMPLANTABLE DEVICE N/A 11/08/2015   MRI COMPATABLE -- Procedure: Pacemaker Implant;  Surgeon: Deboraha Sprang, MD;  Location: [facility] [facility];  Service: Cardiovascular;  Laterality: N/A;   INSERT / REPLACE / REMOVE PACEMAKER  2017   Dr. Jens Som [facility])   JOINT REPLACEMENT     LUMBAR [place] SURGERY  1990's   x 2   THYROIDECTOMY  2011   TOTAL KNEE ARTHROPLASTY  07/27/2011   Procedure: TOTAL KNEE ARTHROPLASTY;  Surgeon: Mauri Pole, MD;  Location: [facility];  Service: Orthopedics;  Laterality: Left;   UPPER ESOPHAGEAL ENDOSCOPIC ULTRASOUND (EUS) N/A 05/14/2016   Procedure: UPPER ESOPHAGEAL ENDOSCOPIC ULTRASOUND (EUS);  Surgeon: Milus Banister, MD;  Location: [facility];  Service: Endoscopy;  Laterality: N/A;  radial only-will be done mod if needed    Current Outpatient Medications  Medication Sig  Dispense Refill   acetaminophen (TYLENOL) 500 MG tablet Take 1,000 mg by mouth every 6 (six) hours as needed for moderate pain (for headaches).      albuterol (PROVENTIL) (2.5 MG/3ML) 0.083% nebulizer solution Take 3 mLs (2.5 mg total) by nebulization every 2 (two) hours as needed for wheezing or shortness of breath. 75 mL 5   albuterol (VENTOLIN HFA) 108 (90 Base) MCG/ACT inhaler Inhale 2 puffs into the lungs every 6 (six) hours as needed for wheezing or shortness of breath. 3 each 0   Alcohol Swabs (B-D SINGLE USE SWABS REGULAR) PADS  allopurinol (ZYLOPRIM) 100 MG tablet Take 100 mg by mouth 2 (two) times daily.     amiodarone (PACERONE) 200 MG tablet Take 1 tablet (200 mg total) by mouth daily. 90 tablet 3   amLODipine (NORVASC) 5 MG tablet Take 5 mg by mouth daily.     ascorbic acid (VITAMIN C) 500 MG tablet Take 500 mg by mouth every morning.     atorvastatin (LIPITOR) 40 MG tablet TAKE 1 TABLET EVERY DAY (Patient taking differently: Take 40 mg by mouth daily.) 90 tablet 3   benzonatate (TESSALON) 200 MG capsule Take 1 capsule (200 mg total) by mouth 3 (three) times daily as needed for cough. 45 capsule 1   Blood Glucose Calibration (TRUE METRIX LEVEL 1) Low SOLN      BREZTRI AEROSPHERE 160-9-4.8 MCG/ACT AERO INHALE 2 PUFFS INTO THE LUNGS IN THE MORNING AND AT BEDTIME. 32.1 g 3   cephALEXin (KEFLEX) 250 MG capsule Take 1 capsule (250 mg total) by mouth every 8 (eight) hours. 15 capsule 0   Dextromethorphan-guaiFENesin (TUSSIN DM MAX SUGAR-FREE PO) Take 5 mLs by mouth every 4 (four) hours as needed (for coughing).     ELIQUIS 5 MG TABS tablet TAKE 1 TABLET TWICE DAILY (Patient taking differently: Take 5 mg by mouth 2 (two) times daily.) 180 tablet 1   ferrous sulfate 325 (65 FE) MG tablet Take 1 tablet (325 mg total) by mouth daily with breakfast. 90 tablet 3   fluticasone (FLONASE) 50 MCG/ACT nasal spray USE 2 SPRAYS NASALLY DAILY (Patient taking differently: Place 2 sprays into both  nostrils daily as needed for allergies or rhinitis.) 48 g 1   gabapentin (NEURONTIN) 800 MG tablet Take 800 mg by mouth 4 (four) times daily as needed (neuropathy pain).     insulin aspart (NOVOLOG) 100 UNIT/ML FlexPen Inject 30-70 Units into the skin 3 (three) times daily with meals. Per sliding scale (and an additional 1 unit for every 50 points for a BGL >130) Sliding scale     insulin glargine (LANTUS) 100 UNIT/ML injection Inject 110 Units into the skin in the morning and at bedtime.     levothyroxine (SYNTHROID) 112 MCG tablet Take 112 mcg by mouth daily.     LORazepam (ATIVAN) 2 MG tablet Take 2 mg by mouth at bedtime as needed for anxiety or sleep.     metolazone (ZAROXOLYN) 2.5 MG tablet Take Metolazone 2.5mg  - 1 tablet by mouth on 01/10/2021 and 01/13/2021 5 tablet 0   metoprolol succinate (TOPROL-XL) 100 MG 24 hr tablet TAKE 1 TABLET EVERY DAY WITH OR IMMEDIATELY AFTER A MEAL (Patient taking differently: Take 100 mg by mouth daily.) 90 tablet 3   naloxone (NARCAN) nasal spray 4 mg/0.1 mL Place 4 mg into the nose as needed for opioid reversal.     nitroGLYCERIN (NITROSTAT) 0.4 MG SL tablet Place 1 tablet (0.4 mg total) under the tongue every 5 (five) minutes as needed for chest pain. 25 tablet 4   oxyCODONE (ROXICODONE) 5 MG immediate release tablet Take 1 tablet (5 mg total) by mouth every 6 (six) hours as needed for severe pain. 20 tablet 0   PRESCRIPTION MEDICATION CPAP: At bedtime and "as needed for shortness of breath"     torsemide (DEMADEX) 20 MG tablet Take 3 tablets (60 mg total) by mouth every other day. (Patient taking differently: Take 80 mg by mouth daily.) 135 tablet 3   TRUE METRIX BLOOD GLUCOSE TEST test strip      TRUEplus Lancets  28G MISC      vitamin B-12 (CYANOCOBALAMIN) 500 MCG tablet Take 500 mcg by mouth every morning.     Vitamin D3 (VITAMIN D) 25 MCG tablet Take 1,000 Units by mouth every morning.     No current facility-administered medications for this visit.     Allergies:   Dulaglutide   Social History: Social History   Socioeconomic History   Marital status: Married    Spouse name: Jeneen Rinks   Number of children: 3   Years of education: 12   Highest education level: Not on file  Occupational History   Occupation: retired  Tobacco Use   Smoking status: Former    Packs/day: 2.00    Years: 33.00    Pack years: 66.00    Types: Cigarettes    Start date: 1966    Quit date: 05/04/1997    Years since quitting: 23.7   Smokeless tobacco: Never  Vaping Use   Vaping Use: Never used  Substance and Sexual Activity   Alcohol use: No   Drug use: No   Sexual activity: Not on file  Other Topics Concern   Not on file  Social History Narrative   Patient lives at home with her husband Jeneen Rinks .   Retired.   Education 12 th grade.   Right handed   Caffeine two cups of coffee.   Social Determinants of Health   Financial Resource Strain: Not on file  Food Insecurity: Not on file  Transportation Needs: Not on file  Physical Activity: Not on file  Stress: Not on file  Social Connections: Not on file  Intimate Partner Violence: Not on file    Family History: Family History  Problem Relation Age of Onset   Heart Problems Mother    Heart defect Mother        valve problems   Heart Problems Father    Heart disease Brother    Lung cancer Daughter        non small cell    Diabetes Maternal Aunt    Diabetes Maternal Uncle    Throat cancer Brother    Colon cancer Neg Hx    Colon polyps Neg Hx    Esophageal cancer Neg Hx    Rectal cancer Neg Hx    Stomach cancer Neg Hx      Review of Systems: All other systems reviewed and are otherwise negative except as noted above.  Physical Exam: Vitals:   01/30/21 1117  BP: (!) 160/84  Pulse: 85  SpO2: 92%  Weight: 249 lb (112.9 kg)  Height: 5\' 5"  (1.651 m)     GEN- The patient is fatigued and SOB appearing after arrival. Improved with rest. alert and oriented x 3 today.   HEENT:  normocephalic, atraumatic; sclera clear, conjunctiva pink; hearing intact; oropharynx clear; neck supple  Lungs- Diminished throughout, normal work of breathing.  No wheezes, rales, rhonchi Heart- Regular rate and rhythm, no murmurs, rubs or gallops  GI- soft, non-tender, non-distended, bowel sounds present  Extremities- no clubbing or cyanosis. Bilateral peripheral edema R>L. Cellulitic R changes with several small fluid filled blisters MS- no significant deformity or atrophy Skin- warm and dry, no rash or lesion; PPM pocket well healed Psych- flat but appropriate Neuro- strength and sensation are intact  PPM Interrogation- reviewed in detail today,  See PACEART report  EKG:  EKG is not ordered today.  Recent Labs: 09/24/2020: Magnesium 2.0; NT-Pro BNP 562 01/15/2021: ALT 12; BUN 51; Creatinine, Ser 2.12; Hemoglobin  11.2; Platelets 387; Potassium 4.5; Sodium 138; TSH 0.524   Wt Readings from Last 3 Encounters:  01/14/21 248 lb (112.5 kg)  01/09/21 255 lb 9.6 oz (115.9 kg)  12/09/20 258 lb 6.4 oz (117.2 kg)     Other studies Reviewed: Additional studies/ records that were reviewed today include: Previous EP office notes, Previous remote checks, Most recent labwork.   Assessment and Plan:  Sinus node dysfunction    Atrial fibrillation-paroxysmal   High risk medication surveillance-amiodarone   Pacemaker-Medtronic      COPD   Renal insufficiency grade 3   OSA treated    Hypertension   HFpEF acute/chronic   Lower extremity cellulitis   Treated hypothyroidism  NYHA IIIb symptoms with conversational dyspnea and SOB with any exertion. She is volume overloaded on exam. JVP is difficult to assess given her body habitus but appears at least 8-10 cm.  She has not had much UOP today, but held torsemide to drive to her appointment.   She has cellulitic changes to her R LE, but just finished ABX.   She has shown little improvement over the past several weeks despite  adjustment.    We believe she would best be served with an admission for IV diuresis and close following of her renal function. We have arranged this. Unfortunately, there are several people ahead of her and no beds currently available. She refuses to proceed to ED. She will await patient placement call for bed availability. She understands to report to ED with any worsening  Inpatient team has also been made aware.   Current medicines are reviewed at length with the patient today.    Labs/ tests ordered today include:  Orders Placed This Encounter  Procedures   Basic metabolic panel   CBC   Disposition:   Follow up pending admission outcome.    Jacalyn Lefevre, PA-C  01/29/2021 9:31 PM  [address] [id number] [phone] (office) [phone] (fax)

## 2021-01-29 NOTE — Telephone Encounter (Signed)
Per Dr Caryl Comes, spoke with admitting and requested direct admit for pt tomorrow afternoon once pt is evaluated by Lily Kocher for HF, volume overloaded not resolved by outpatient treatment.  Pt is to see [name], PA-C at 12 noon on 01/30/2021.

## 2021-01-30 ENCOUNTER — Ambulatory Visit (INDEPENDENT_AMBULATORY_CARE_PROVIDER_SITE_OTHER): Payer: Medicare HMO | Admitting: Student

## 2021-01-30 ENCOUNTER — Inpatient Hospital Stay (HOSPITAL_COMMUNITY)
Admission: RE | Admit: 2021-01-30 | Discharge: 2021-02-01 | DRG: 291 | Disposition: A | Discharge status: Home / Self Care | Payer: Medicare HMO | Source: Ambulatory Visit | Attending: Cardiology | Admitting: Cardiology

## 2021-01-30 ENCOUNTER — Other Ambulatory Visit: Payer: Self-pay

## 2021-01-30 ENCOUNTER — Encounter: Payer: Self-pay | Admitting: Student

## 2021-01-30 VITALS — BP 160/84 | HR 85 | Ht 65.0 in | Wt 249.0 lb

## 2021-01-30 DIAGNOSIS — Z6841 Body Mass Index (BMI) 40.0 and over, adult: Secondary | ICD-10-CM | POA: Diagnosis not present

## 2021-01-30 DIAGNOSIS — R0602 Shortness of breath: Secondary | ICD-10-CM | POA: Diagnosis not present

## 2021-01-30 DIAGNOSIS — J449 Chronic obstructive pulmonary disease, unspecified: Secondary | ICD-10-CM | POA: Diagnosis present

## 2021-01-30 DIAGNOSIS — I2510 Atherosclerotic heart disease of native coronary artery without angina pectoris: Secondary | ICD-10-CM | POA: Diagnosis present

## 2021-01-30 DIAGNOSIS — G4733 Obstructive sleep apnea (adult) (pediatric): Secondary | ICD-10-CM | POA: Diagnosis present

## 2021-01-30 DIAGNOSIS — Z7901 Long term (current) use of anticoagulants: Secondary | ICD-10-CM | POA: Diagnosis not present

## 2021-01-30 DIAGNOSIS — I495 Sick sinus syndrome: Secondary | ICD-10-CM

## 2021-01-30 DIAGNOSIS — M4802 Spinal stenosis, cervical region: Secondary | ICD-10-CM | POA: Diagnosis present

## 2021-01-30 DIAGNOSIS — I130 Hypertensive heart and chronic kidney disease with heart failure and stage 1 through stage 4 chronic kidney disease, or unspecified chronic kidney disease: Secondary | ICD-10-CM | POA: Diagnosis not present

## 2021-01-30 DIAGNOSIS — I872 Venous insufficiency (chronic) (peripheral): Secondary | ICD-10-CM | POA: Diagnosis not present

## 2021-01-30 DIAGNOSIS — L03115 Cellulitis of right lower limb: Secondary | ICD-10-CM | POA: Diagnosis not present

## 2021-01-30 DIAGNOSIS — R238 Other skin changes: Secondary | ICD-10-CM | POA: Diagnosis present

## 2021-01-30 DIAGNOSIS — Z950 Presence of cardiac pacemaker: Secondary | ICD-10-CM

## 2021-01-30 DIAGNOSIS — Z801 Family history of malignant neoplasm of trachea, bronchus and lung: Secondary | ICD-10-CM

## 2021-01-30 DIAGNOSIS — Z20822 Contact with and (suspected) exposure to covid-19: Secondary | ICD-10-CM | POA: Diagnosis not present

## 2021-01-30 DIAGNOSIS — I5033 Acute on chronic diastolic (congestive) heart failure: Secondary | ICD-10-CM | POA: Diagnosis not present

## 2021-01-30 DIAGNOSIS — L02415 Cutaneous abscess of right lower limb: Secondary | ICD-10-CM

## 2021-01-30 DIAGNOSIS — I480 Paroxysmal atrial fibrillation: Secondary | ICD-10-CM | POA: Diagnosis present

## 2021-01-30 DIAGNOSIS — Z79899 Other long term (current) drug therapy: Secondary | ICD-10-CM

## 2021-01-30 DIAGNOSIS — Z794 Long term (current) use of insulin: Secondary | ICD-10-CM

## 2021-01-30 DIAGNOSIS — E1122 Type 2 diabetes mellitus with diabetic chronic kidney disease: Secondary | ICD-10-CM | POA: Diagnosis present

## 2021-01-30 DIAGNOSIS — Z89422 Acquired absence of other left toe(s): Secondary | ICD-10-CM

## 2021-01-30 DIAGNOSIS — Z833 Family history of diabetes mellitus: Secondary | ICD-10-CM

## 2021-01-30 DIAGNOSIS — Z96652 Presence of left artificial knee joint: Secondary | ICD-10-CM | POA: Diagnosis present

## 2021-01-30 DIAGNOSIS — D631 Anemia in chronic kidney disease: Secondary | ICD-10-CM | POA: Diagnosis present

## 2021-01-30 DIAGNOSIS — Z9049 Acquired absence of other specified parts of digestive tract: Secondary | ICD-10-CM

## 2021-01-30 DIAGNOSIS — I10 Essential (primary) hypertension: Secondary | ICD-10-CM | POA: Diagnosis present

## 2021-01-30 DIAGNOSIS — I509 Heart failure, unspecified: Secondary | ICD-10-CM

## 2021-01-30 DIAGNOSIS — E785 Hyperlipidemia, unspecified: Secondary | ICD-10-CM | POA: Diagnosis present

## 2021-01-30 DIAGNOSIS — M5481 Occipital neuralgia: Secondary | ICD-10-CM | POA: Diagnosis present

## 2021-01-30 DIAGNOSIS — I152 Hypertension secondary to endocrine disorders: Secondary | ICD-10-CM | POA: Diagnosis present

## 2021-01-30 DIAGNOSIS — E669 Obesity, unspecified: Secondary | ICD-10-CM | POA: Diagnosis present

## 2021-01-30 DIAGNOSIS — E119 Type 2 diabetes mellitus without complications: Secondary | ICD-10-CM

## 2021-01-30 DIAGNOSIS — E890 Postprocedural hypothyroidism: Secondary | ICD-10-CM | POA: Diagnosis present

## 2021-01-30 DIAGNOSIS — N184 Chronic kidney disease, stage 4 (severe): Secondary | ICD-10-CM | POA: Diagnosis present

## 2021-01-30 DIAGNOSIS — Z8249 Family history of ischemic heart disease and other diseases of the circulatory system: Secondary | ICD-10-CM

## 2021-01-30 DIAGNOSIS — Z981 Arthrodesis status: Secondary | ICD-10-CM | POA: Diagnosis not present

## 2021-01-30 DIAGNOSIS — E1169 Type 2 diabetes mellitus with other specified complication: Secondary | ICD-10-CM | POA: Diagnosis present

## 2021-01-30 DIAGNOSIS — E1159 Type 2 diabetes mellitus with other circulatory complications: Secondary | ICD-10-CM | POA: Diagnosis present

## 2021-01-30 DIAGNOSIS — Z7951 Long term (current) use of inhaled steroids: Secondary | ICD-10-CM

## 2021-01-30 DIAGNOSIS — Z90710 Acquired absence of both cervix and uterus: Secondary | ICD-10-CM

## 2021-01-30 LAB — CBC
Hematocrit: 35.9 % (ref 34.0–46.6)
Hemoglobin: 11.3 g/dL (ref 11.1–15.9)
MCH: 28.6 pg (ref 26.6–33.0)
MCHC: 31.5 g/dL (ref 31.5–35.7)
MCV: 91 fL (ref 79–97)
Platelets: 389 10*3/uL (ref 150–450)
RBC: 3.95 x10E6/uL (ref 3.77–5.28)
RDW: 13.4 % (ref 11.7–15.4)
WBC: 9.8 10*3/uL (ref 3.4–10.8)

## 2021-01-30 LAB — CBC WITH DIFFERENTIAL/PLATELET
Abs Immature Granulocytes: 0.07 10*3/uL (ref 0.00–0.07)
Basophils Absolute: 0.1 10*3/uL (ref 0.0–0.1)
Basophils Relative: 1 %
Eosinophils Absolute: 0.2 10*3/uL (ref 0.0–0.5)
Eosinophils Relative: 1 %
HCT: 37 % (ref 36.0–46.0)
Hemoglobin: 11.4 g/dL — ABNORMAL LOW (ref 12.0–15.0)
Immature Granulocytes: 1 %
Lymphocytes Relative: 19 %
Lymphs Abs: 2.3 10*3/uL (ref 0.7–4.0)
MCH: 29.2 pg (ref 26.0–34.0)
MCHC: 30.8 g/dL (ref 30.0–36.0)
MCV: 94.6 fL (ref 80.0–100.0)
Monocytes Absolute: 0.9 10*3/uL (ref 0.1–1.0)
Monocytes Relative: 8 %
Neutro Abs: 8.4 10*3/uL — ABNORMAL HIGH (ref 1.7–7.7)
Neutrophils Relative %: 70 %
Platelets: 394 10*3/uL (ref 150–400)
RBC: 3.91 MIL/uL (ref 3.87–5.11)
RDW: 14.7 % (ref 11.5–15.5)
WBC: 11.9 10*3/uL — ABNORMAL HIGH (ref 4.0–10.5)
nRBC: 0 % (ref 0.0–0.2)

## 2021-01-30 LAB — CUP PACEART INCLINIC DEVICE CHECK
Battery Remaining Longevity: 49 mo
Battery Voltage: 2.99 V
Brady Statistic AP VP Percent: 5.12 %
Brady Statistic AP VS Percent: 62.52 %
Brady Statistic AS VP Percent: 17.1 %
Brady Statistic AS VS Percent: 15.26 %
Brady Statistic RA Percent Paced: 65.01 %
Brady Statistic RV Percent Paced: 22.24 %
Date Time Interrogation Session: 20220929133648
Implantable Lead Implant Date: 20170707
Implantable Lead Implant Date: 20170707
Implantable Lead Location: 753859
Implantable Lead Location: 753860
Implantable Lead Model: 3830
Implantable Lead Model: 5076
Implantable Pulse Generator Implant Date: 20170707
Lead Channel Impedance Value: 285 Ohm
Lead Channel Impedance Value: 380 Ohm
Lead Channel Impedance Value: 399 Ohm
Lead Channel Impedance Value: 418 Ohm
Lead Channel Pacing Threshold Amplitude: 0.875 V
Lead Channel Pacing Threshold Amplitude: 1.875 V
Lead Channel Pacing Threshold Pulse Width: 0.4 ms
Lead Channel Pacing Threshold Pulse Width: 0.4 ms
Lead Channel Sensing Intrinsic Amplitude: 3.75 mV
Lead Channel Sensing Intrinsic Amplitude: 3.75 mV
Lead Channel Sensing Intrinsic Amplitude: 5.5 mV
Lead Channel Sensing Intrinsic Amplitude: 6 mV
Lead Channel Setting Pacing Amplitude: 1.75 V
Lead Channel Setting Pacing Amplitude: 2.5 V
Lead Channel Setting Pacing Pulse Width: 1 ms
Lead Channel Setting Sensing Sensitivity: 2.8 mV

## 2021-01-30 LAB — COMPREHENSIVE METABOLIC PANEL
ALT: 15 U/L (ref 0–44)
AST: 17 U/L (ref 15–41)
Albumin: 2.9 g/dL — ABNORMAL LOW (ref 3.5–5.0)
Alkaline Phosphatase: 76 U/L (ref 38–126)
Anion gap: 13 (ref 5–15)
BUN: 36 mg/dL — ABNORMAL HIGH (ref 8–23)
CO2: 23 mmol/L (ref 22–32)
Calcium: 9 mg/dL (ref 8.9–10.3)
Chloride: 103 mmol/L (ref 98–111)
Creatinine, Ser: 2.03 mg/dL — ABNORMAL HIGH (ref 0.44–1.00)
GFR, Estimated: 25 mL/min — ABNORMAL LOW (ref >60)
Glucose, Bld: 176 mg/dL — ABNORMAL HIGH (ref 70–99)
Potassium: 4 mmol/L (ref 3.5–5.1)
Sodium: 139 mmol/L (ref 135–145)
Total Bilirubin: 0.5 mg/dL (ref 0.3–1.2)
Total Protein: 6.5 g/dL (ref 6.5–8.1)

## 2021-01-30 LAB — BASIC METABOLIC PANEL
BUN/Creatinine Ratio: 23 (ref 12–28)
BUN: 35 mg/dL — ABNORMAL HIGH (ref 8–27)
CO2: 23 mmol/L (ref 20–29)
Calcium: 8.7 mg/dL (ref 8.7–10.3)
Chloride: 103 mmol/L (ref 96–106)
Creatinine, Ser: 1.49 mg/dL — ABNORMAL HIGH (ref 0.57–1.00)
Glucose: 257 mg/dL — ABNORMAL HIGH (ref 70–99)
Potassium: 4.2 mmol/L (ref 3.5–5.2)
Sodium: 142 mmol/L (ref 134–144)
eGFR: 36 mL/min/{1.73_m2} — ABNORMAL LOW (ref >59)

## 2021-01-30 LAB — GLUCOSE, CAPILLARY
Glucose-Capillary: 200 mg/dL — ABNORMAL HIGH (ref 70–99)
Glucose-Capillary: 237 mg/dL — ABNORMAL HIGH (ref 70–99)

## 2021-01-30 LAB — HEMOGLOBIN A1C
Hgb A1c MFr Bld: 8.4 % — ABNORMAL HIGH (ref 4.8–5.6)
Mean Plasma Glucose: 194.38 mg/dL

## 2021-01-30 MED ORDER — HYDROCODONE-ACETAMINOPHEN 5-325 MG PO TABS
1.0000 | ORAL_TABLET | Freq: Two times a day (BID) | ORAL | Status: DC | PRN
Start: 2021-01-30 — End: 2021-02-01
  Administered 2021-01-31 – 2021-02-01 (×3): 1 via ORAL
  Filled 2021-01-30 (×4): qty 1

## 2021-01-30 MED ORDER — FERROUS SULFATE 325 (65 FE) MG PO TABS
325.0000 mg | ORAL_TABLET | Freq: Every day | ORAL | Status: DC
  Administered 2021-02-01: 325 mg via ORAL
  Filled 2021-01-30: qty 1

## 2021-01-30 MED ORDER — ATORVASTATIN CALCIUM 40 MG PO TABS
40.0000 mg | ORAL_TABLET | Freq: Every day | ORAL | Status: DC
  Administered 2021-01-31 – 2021-02-01 (×2): 40 mg via ORAL
  Filled 2021-01-30 (×2): qty 1

## 2021-01-30 MED ORDER — INSULIN ASPART 100 UNIT/ML IJ SOLN
0.0000 [IU] | Freq: Three times a day (TID) | INTRAMUSCULAR | Status: DC
Start: 2021-01-31 — End: 2021-02-01
  Administered 2021-01-31 (×2): 11 [IU] via SUBCUTANEOUS
  Administered 2021-01-31: 3 [IU] via SUBCUTANEOUS
  Administered 2021-02-01: 20 [IU] via SUBCUTANEOUS
  Administered 2021-02-01: 4 [IU] via SUBCUTANEOUS

## 2021-01-30 MED ORDER — AMIODARONE HCL 200 MG PO TABS
200.0000 mg | ORAL_TABLET | Freq: Every day | ORAL | Status: DC
  Administered 2021-01-31 – 2021-02-01 (×2): 200 mg via ORAL
  Filled 2021-01-30 (×2): qty 1

## 2021-01-30 MED ORDER — AMLODIPINE BESYLATE 5 MG PO TABS
5.0000 mg | ORAL_TABLET | Freq: Every day | ORAL | Status: DC
  Administered 2021-01-31 – 2021-02-01 (×2): 5 mg via ORAL
  Filled 2021-01-30 (×2): qty 1

## 2021-01-30 MED ORDER — SODIUM CHLORIDE 0.9% FLUSH
3.0000 mL | INTRAVENOUS | Status: DC | PRN
  Administered 2021-01-30: 3 mL via INTRAVENOUS

## 2021-01-30 MED ORDER — ACETAMINOPHEN 325 MG PO TABS
650.0000 mg | ORAL_TABLET | ORAL | Status: DC | PRN
  Administered 2021-01-30: 650 mg via ORAL
  Filled 2021-01-30: qty 2

## 2021-01-30 MED ORDER — METOPROLOL SUCCINATE ER 100 MG PO TB24
100.0000 mg | ORAL_TABLET | Freq: Every day | ORAL | Status: DC
  Administered 2021-01-31 – 2021-02-01 (×2): 100 mg via ORAL
  Filled 2021-01-30 (×2): qty 1

## 2021-01-30 MED ORDER — ALBUTEROL SULFATE (2.5 MG/3ML) 0.083% IN NEBU: 2.5000 mg | INHALATION_SOLUTION | RESPIRATORY_TRACT | Status: DC | PRN

## 2021-01-30 MED ORDER — NITROGLYCERIN 0.4 MG SL SUBL: 0.4000 mg | SUBLINGUAL_TABLET | SUBLINGUAL | Status: DC | PRN

## 2021-01-30 MED ORDER — OXYCODONE HCL 5 MG PO TABS: 2.5000 mg | ORAL_TABLET | Freq: Two times a day (BID) | ORAL | Status: DC | PRN

## 2021-01-30 MED ORDER — APIXABAN 5 MG PO TABS
5.0000 mg | ORAL_TABLET | Freq: Two times a day (BID) | ORAL | Status: DC
  Administered 2021-01-31 – 2021-02-01 (×3): 5 mg via ORAL
  Filled 2021-01-30 (×4): qty 1

## 2021-01-30 MED ORDER — VITAMIN D 25 MCG (1000 UNIT) PO TABS
1000.0000 [IU] | ORAL_TABLET | Freq: Every morning | ORAL | Status: DC
  Administered 2021-01-31 – 2021-02-01 (×2): 1000 [IU] via ORAL
  Filled 2021-01-30 (×3): qty 1

## 2021-01-30 MED ORDER — ONDANSETRON HCL 4 MG/2ML IJ SOLN: 4.0000 mg | Freq: Four times a day (QID) | INTRAMUSCULAR | Status: DC | PRN

## 2021-01-30 MED ORDER — BENZONATATE 100 MG PO CAPS: 200.0000 mg | ORAL_CAPSULE | Freq: Three times a day (TID) | ORAL | Status: DC | PRN

## 2021-01-30 MED ORDER — FUROSEMIDE 10 MG/ML IJ SOLN
80.0000 mg | Freq: Two times a day (BID) | INTRAMUSCULAR | Status: DC
  Administered 2021-01-30 – 2021-02-01 (×4): 80 mg via INTRAVENOUS
  Filled 2021-01-30 (×5): qty 8

## 2021-01-30 MED ORDER — ASCORBIC ACID 500 MG PO TABS
500.0000 mg | ORAL_TABLET | Freq: Every morning | ORAL | Status: DC
  Administered 2021-01-31 – 2021-02-01 (×2): 500 mg via ORAL
  Filled 2021-01-30 (×2): qty 1

## 2021-01-30 MED ORDER — SODIUM CHLORIDE 0.9 % IV SOLN: 250.0000 mL | INTRAVENOUS | Status: DC | PRN

## 2021-01-30 MED ORDER — GABAPENTIN 100 MG PO CAPS
200.0000 mg | ORAL_CAPSULE | Freq: Three times a day (TID) | ORAL | Status: DC
  Administered 2021-01-31 – 2021-02-01 (×5): 200 mg via ORAL
  Filled 2021-01-30 (×5): qty 2

## 2021-01-30 MED ORDER — LEVOTHYROXINE SODIUM 112 MCG PO TABS
112.0000 ug | ORAL_TABLET | Freq: Every day | ORAL | Status: DC
  Administered 2021-01-31 – 2021-02-01 (×2): 112 ug via ORAL
  Filled 2021-01-30 (×2): qty 1

## 2021-01-30 MED ORDER — FLUTICASONE PROPIONATE 50 MCG/ACT NA SUSP
2.0000 | Freq: Every day | NASAL | Status: DC | PRN
  Filled 2021-01-30: qty 16

## 2021-01-30 MED ORDER — ACETAMINOPHEN 500 MG PO TABS: 500.0000 mg | ORAL_TABLET | Freq: Four times a day (QID) | ORAL | Status: DC | PRN

## 2021-01-30 MED ORDER — ACETAMINOPHEN 500 MG PO TABS: 1000.0000 mg | ORAL_TABLET | Freq: Four times a day (QID) | ORAL | Status: DC | PRN

## 2021-01-30 MED ORDER — ALLOPURINOL 100 MG PO TABS
100.0000 mg | ORAL_TABLET | Freq: Two times a day (BID) | ORAL | Status: DC
  Administered 2021-01-31 – 2021-02-01 (×3): 100 mg via ORAL
  Filled 2021-01-30 (×4): qty 1

## 2021-01-30 MED ORDER — SODIUM CHLORIDE 0.9% FLUSH
3.0000 mL | Freq: Two times a day (BID) | INTRAVENOUS | Status: DC
Start: 2021-01-30 — End: 2021-02-01
  Administered 2021-01-30 – 2021-02-01 (×4): 3 mL via INTRAVENOUS

## 2021-01-30 MED ORDER — VITAMIN B-12 1000 MCG PO TABS
500.0000 ug | ORAL_TABLET | Freq: Every morning | ORAL | Status: DC
  Administered 2021-01-31 – 2021-02-01 (×2): 500 ug via ORAL
  Filled 2021-01-30 (×2): qty 1

## 2021-01-30 MED ORDER — INSULIN ASPART 100 UNIT/ML IJ SOLN
0.0000 [IU] | Freq: Every day | INTRAMUSCULAR | Status: DC
Start: 2021-01-30 — End: 2021-02-01
  Administered 2021-01-30: 2 [IU] via SUBCUTANEOUS
  Administered 2021-01-31: 3 [IU] via SUBCUTANEOUS

## 2021-01-30 MED ORDER — ALBUTEROL SULFATE HFA 108 (90 BASE) MCG/ACT IN AERS: 2.0000 | INHALATION_SPRAY | Freq: Four times a day (QID) | RESPIRATORY_TRACT | Status: DC | PRN

## 2021-01-30 NOTE — Patient Instructions (Signed)
Medication Instructions:  Your physician recommends that you continue on your current medications as directed. Please refer to the Current Medication list given to you today.  *If you need a refill on your cardiac medications before your next appointment, please call your pharmacy*   Lab Work: TODAY: BMET, CBC  If you have labs (blood work) drawn today and your tests are completely normal, you will receive your results only by: [place] (if you have MyChart) OR A paper copy in the mail If you have any lab test that is abnormal or we need to change your treatment, we will call you to review the results.   Follow-Up: At [facility], you and your health needs are our priority.  As part of our continuing mission to provide you with exceptional heart care, we have created designated Provider Care Teams.  These Care Teams include your primary Cardiologist (physician) and Advanced Practice Providers (APPs -  Physician Assistants and Nurse Practitioners) who all work together to provide you with the care you need, when you need it.  We recommend signing up for the patient portal called "MyChart".  Sign up information is provided on this After Visit Summary.  MyChart is used to connect with patients for Virtual Visits (Telemedicine).  Patients are able to view lab/test results, encounter notes, upcoming appointments, etc.  Non-urgent messages can be sent to your provider as well.   To learn more about what you can do with MyChart, go to [email or web address].

## 2021-01-30 NOTE — Progress Notes (Signed)
PT home cpap set up an ready for use.

## 2021-01-30 NOTE — Telephone Encounter (Signed)
Spoke with pt and advised to keep her appointment with [name], PA-C today and that bed has been requested if admission is needed.  Pt verbalizes understanding and agrees with current plan.

## 2021-01-30 NOTE — Plan of Care (Signed)

## 2021-01-31 ENCOUNTER — Encounter (HOSPITAL_COMMUNITY): Payer: Self-pay | Admitting: Internal Medicine

## 2021-01-31 ENCOUNTER — Inpatient Hospital Stay (HOSPITAL_COMMUNITY): Payer: Medicare HMO

## 2021-01-31 DIAGNOSIS — I509 Heart failure, unspecified: Secondary | ICD-10-CM | POA: Diagnosis not present

## 2021-01-31 DIAGNOSIS — E1122 Type 2 diabetes mellitus with diabetic chronic kidney disease: Secondary | ICD-10-CM | POA: Diagnosis not present

## 2021-01-31 DIAGNOSIS — I5033 Acute on chronic diastolic (congestive) heart failure: Secondary | ICD-10-CM | POA: Diagnosis not present

## 2021-01-31 DIAGNOSIS — N184 Chronic kidney disease, stage 4 (severe): Secondary | ICD-10-CM | POA: Diagnosis not present

## 2021-01-31 DIAGNOSIS — Z794 Long term (current) use of insulin: Secondary | ICD-10-CM

## 2021-01-31 LAB — BASIC METABOLIC PANEL
Anion gap: 12 (ref 5–15)
BUN: 35 mg/dL — ABNORMAL HIGH (ref 8–23)
CO2: 24 mmol/L (ref 22–32)
Calcium: 8.9 mg/dL (ref 8.9–10.3)
Chloride: 103 mmol/L (ref 98–111)
Creatinine, Ser: 1.95 mg/dL — ABNORMAL HIGH (ref 0.44–1.00)
GFR, Estimated: 26 mL/min — ABNORMAL LOW (ref >60)
Glucose, Bld: 166 mg/dL — ABNORMAL HIGH (ref 70–99)
Potassium: 3.7 mmol/L (ref 3.5–5.1)
Sodium: 139 mmol/L (ref 135–145)

## 2021-01-31 LAB — GLUCOSE, CAPILLARY
Glucose-Capillary: 129 mg/dL — ABNORMAL HIGH (ref 70–99)
Glucose-Capillary: 267 mg/dL — ABNORMAL HIGH (ref 70–99)
Glucose-Capillary: 284 mg/dL — ABNORMAL HIGH (ref 70–99)
Glucose-Capillary: 291 mg/dL — ABNORMAL HIGH (ref 70–99)

## 2021-01-31 LAB — SARS CORONAVIRUS 2 (TAT 6-24 HRS): SARS Coronavirus 2: NEGATIVE

## 2021-01-31 MED ORDER — COLLAGENASE 250 UNIT/GM EX OINT
TOPICAL_OINTMENT | Freq: Every day | CUTANEOUS | Status: DC
  Filled 2021-01-31 (×2): qty 30

## 2021-01-31 MED ORDER — INSULIN GLARGINE-YFGN 100 UNIT/ML ~~LOC~~ SOLN
22.0000 [IU] | Freq: Every day | SUBCUTANEOUS | Status: DC
  Administered 2021-01-31 – 2021-02-01 (×2): 22 [IU] via SUBCUTANEOUS
  Filled 2021-01-31 (×2): qty 0.22

## 2021-01-31 NOTE — H&P (Addendum)
Cardiology Admission History and Physical:   Patient ID: [name] MRN: [id number]; DOB: Feb 20, 1941   Admission date: 01/30/2021  PCP:  [name], [name]   [facility] Providers Cardiologist:  Candee Furbish, [name]  Electrophysiologist:  Virl Axe, [name]       Chief Complaint:  SOB and edema  Patient Profile:   Kayla [name] is a 80 y.o. female with  who is being seen 01/31/2021 for SOB with hx of non obstructive CAD by cath 2006 and low risk myoview 2013,  failure with dofetilide for PAF, PPM 7/17 for SB and atrial fib, anemia as well the evaluation of CHF.  History of Present Illness:   Kayla [name] with above hx and seen by Dr. Caryl Comes 01/09/21 her PPM with initial improvement in her atrial fib burden, with initiation of amiodarone, though more recently more a fib.  Amiodarone was continued and she was also found to be significantly volume overloaded.  Her demadex was increased from 60 to 80 mg daily, and a dose of metolazone.  She had cellulitis of lower ext and keflex 500 mg added for 5 days.   Dr. Caryl Comes wanted her to go to hospital at that time but pt was reluctant.  She had follow up yesterday.  In office her SOB and edema without much change.  Rt leg > Lt leg.  + blisters and some oozing.  She has dyspnea with conversational dyspnea, and with ADLs.  She was overloaded and she agreed to IV diuresis in hospital with her CKD and need for close monitoring.    Na 139, K+ 4.0 CR 2.03 albumin 2.9 on admit   WBC 11.9 Hgb 11.4 plts 394  A1c 8.4  Covid neg  2V CXR No active cardiopulmonary disease  Pacer check with atrial fib.   EKG:  The ECG that was done 01/09/21  was personally reviewed and demonstrates SR at 54   TELE:  SR with atrial pacing. VS 138/58 P 76 to 101 afebrile   She does have some chest discomfort with exertion and will take some NTG.    Past Medical History:  Diagnosis Date   A-fib [facility])    Allergy    Anxiety    Arthritis    CAD (coronary artery  disease)    Mild per remote cath in 2006, /    Nuclear, January, 2013, no significant abnormality,   Carotid artery disease ([place])    Doppler, January, 2013, 0 62% RIC A., 13-08% LICA, stable   Cataract    CHF (congestive heart failure) ([place])    Complication of anesthesia    wakes up "mean"   COPD 02/16/2007   Intolerant of Spiriva, tudorza Intolerant of Trelegy    COPD (chronic obstructive pulmonary disease) ([place])    Coronary atherosclerosis 11/24/2008   Catheterization 2006, nonobstructive coronary disease   //   nuclear 2013, low risk     Diabetes mellitus, type 2 ([place])    Diverticula of colon    DIVERTICULOSIS OF COLON 03/23/2007   Qualifier: Diagnosis of  By: Halford Chessman [name], Kayla [name]     Dizziness    occasional   Dizzy spells 05/04/2011   DM (diabetes mellitus) ([place]) 03/23/2007   Qualifier: Diagnosis of  By: Halford Chessman [name], Kayla [name]     Dyspnea    On exertion   Ejection fraction    Essential hypertension 02/16/2007   Qualifier: Diagnosis of  By: Rosana Hoes CMA, Tammy     G E R D 03/23/2007  Qualifier: Diagnosis of  By: Halford Chessman [name], Odessa Fleming bladder disease 04/28/2016   Gastritis and gastroduodenitis    GERD (gastroesophageal reflux disease)    Goiter    hx of   Headache(784.0)    migraine   Hyperlipidemia    Hypertension    Hypothyroidism    HYPOTHYROIDISM, POSTSURGICAL 06/04/2008   Qualifier: Diagnosis of  By: Kayla [name], Sean A    Left ventricular hypertrophy    Leg edema    occasional swelling   Leg swelling 12/09/2018   Myofascial pain syndrome, cervical 01/06/2018   Obesity    OBESITY 11/24/2008   Qualifier: Diagnosis of  By: Sidney Ace     OBSTRUCTIVE SLEEP APNEA 02/16/2007   Auto CPAP 09/03/13 to 10/02/13 >> used on 30 of 30 nights with average 6 hrs and 3 min.  Average AHI is 3.1 with median CPAP 5 cm H2O and 95 th percentile CPAP 6 cm H2O.     Occipital neuralgia of right side 01/06/2018   OSA (obstructive sleep apnea)    cpap setting of 12   Pain in joint of right hip  01/21/2018   Palpitations 01/21/2011   48 hour Holter, 2015, scattered PACs and PVCs.    Pneumonia    PONV (postoperative nausea and vomiting)    severe after every surgery   Pulmonary hypertension, secondary    RHINITIS 07/21/2010        RUQ abdominal pain    S/P left TKA 07/27/2011   Sinus node dysfunction ([place]) 11/08/2015   Sleep apnea    Spinal stenosis of cervical region 11/07/2014   Urinary frequency 12/09/2018   UTI (urinary tract infection) 08/15/2019   per pt report   VENTRICULAR HYPERTROPHY, LEFT 11/24/2008   Qualifier: Diagnosis of  By: Sidney Ace      Past Surgical History:  Procedure Laterality Date   ABDOMINAL HYSTERECTOMY     with appendectomy and colon polypectomy   AMPUTATION TOE Left 12/15/2019   Procedure: AMPUTATION  LEFT GREAT TOE;  Surgeon: Evelina Bucy, DPM;  Location: [facility];  Service: Podiatry;  Laterality: Left;   ANTERIOR CERVICAL DECOMP/DISCECTOMY FUSION N/A 11/07/2014   Procedure: Anterior Cervical diskectomy and fusion Cervical four-five, Cervical five-six, Cervical six-seven;  Surgeon: Kary Kos, [name];  Location: [place] NEURO ORS;  Service: Neurosurgery;  Laterality: N/A;   APPENDECTOMY     ARTERY BIOPSY Right 02/16/2017   Procedure: BIOPSY TEMPORAL ARTERY;  Surgeon: Katha Cabal, [name];  Location: [facility] ORS;  Service: Vascular;  Laterality: Right;   ARTERY BIOPSY Left 04/16/2020   Procedure: BIOPSY TEMPORAL ARTERY;  Surgeon: Katha Cabal, [name];  Location: [facility] ORS;  Service: Vascular;  Laterality: Left;  LEFT TEMPLE   BACK SURGERY     CARDIAC CATHETERIZATION     CATARACT EXTRACTION W/ INTRAOCULAR LENS  IMPLANT, BILATERAL Bilateral    CHOLECYSTECTOMY N/A 04/29/2016   Procedure: LAPAROSCOPIC CHOLECYSTECTOMY WITH INTRAOPERATIVE CHOLANGIOGRAM;  Surgeon: Alphonsa Overall, [name];  Location: [facility];  Service: General;  Laterality: N/A;   EP IMPLANTABLE DEVICE N/A 11/08/2015   MRI COMPATABLE -- Procedure: Pacemaker Implant;  Surgeon: Deboraha Sprang, [name];  Location: [place] CV LAB;  Service: Cardiovascular;  Laterality: N/A;   INSERT / REPLACE / REMOVE PACEMAKER  2017   Dr. Jens Som [facility])   JOINT REPLACEMENT     LUMBAR [place] SURGERY  1990's   x 2   THYROIDECTOMY  2011   TOTAL KNEE ARTHROPLASTY  07/27/2011   Procedure:  TOTAL KNEE ARTHROPLASTY;  Surgeon: Mauri Pole, [name];  Location: [facility];  Service: Orthopedics;  Laterality: Left;   UPPER ESOPHAGEAL ENDOSCOPIC ULTRASOUND (EUS) N/A 05/14/2016   Procedure: UPPER ESOPHAGEAL ENDOSCOPIC ULTRASOUND (EUS);  Surgeon: Milus Banister, [name];  Location: [facility];  Service: Endoscopy;  Laterality: N/A;  radial only-will be done mod if needed     Medications Prior to Admission: Prior to Admission medications   Medication Sig Start Date End Date Taking? Authorizing Provider  acetaminophen (TYLENOL) 500 MG tablet Take 1,000 mg by mouth every 6 (six) hours as needed for moderate pain (for headaches).    Yes Provider, Historical, [name]  albuterol (PROVENTIL) (2.5 MG/3ML) 0.083% nebulizer solution Take 3 mLs (2.5 mg total) by nebulization every 2 (two) hours as needed for wheezing or shortness of breath. 05/15/20  Yes Chesley Mires, [name]  albuterol (VENTOLIN HFA) 108 (90 Base) MCG/ACT inhaler Inhale 2 puffs into the lungs every 6 (six) hours as needed for wheezing or shortness of breath. 01/26/21  Yes Tanda Rockers, [name]  allopurinol (ZYLOPRIM) 100 MG tablet Take 100 mg by mouth 2 (two) times daily. 04/16/19  Yes Provider, Historical, [name]  amiodarone (PACERONE) 200 MG tablet Take 1 tablet (200 mg total) by mouth daily. 07/09/20  Yes Deboraha Sprang, [name]  amLODipine (NORVASC) 5 MG tablet Take 5 mg by mouth daily.   Yes Provider, Historical, [name]  ascorbic acid (VITAMIN C) 500 MG tablet Take 500 mg by mouth every morning.   Yes Provider, Historical, [name]  atorvastatin (LIPITOR) 40 MG tablet TAKE 1 TABLET EVERY DAY Patient taking differently: Take 40 mg by mouth daily. 10/23/20  Yes Jerline Pain, [name]  benzonatate (TESSALON) 200 MG  capsule Take 1 capsule (200 mg total) by mouth 3 (three) times daily as needed for cough. 10/16/20 10/16/21 Yes Parrett, Tammy S, NP  BREZTRI AEROSPHERE 160-9-4.8 MCG/ACT AERO INHALE 2 PUFFS INTO THE LUNGS IN THE MORNING AND AT BEDTIME. 01/15/21  Yes Chesley Mires, [name]  Dextromethorphan-guaiFENesin (TUSSIN DM MAX SUGAR-FREE PO) Take 5 mLs by mouth every 4 (four) hours as needed (for coughing).   Yes Provider, Historical, [name]  ELIQUIS 5 MG TABS tablet TAKE 1 TABLET TWICE DAILY Patient taking differently: Take 5 mg by mouth 2 (two) times daily. 09/09/20  Yes Deboraha Sprang, [name]  ferrous sulfate 325 (65 FE) MG tablet Take 1 tablet (325 mg total) by mouth daily with breakfast. 01/26/20  Yes Deboraha Sprang, [name]  fluticasone [facility]) 50 MCG/ACT nasal spray USE 2 SPRAYS NASALLY DAILY Patient taking differently: Place 2 sprays into both nostrils daily as needed for allergies or rhinitis. 02/26/17  Yes Collene Gobble, [name]  gabapentin (NEURONTIN) 800 MG tablet Take 800 mg by mouth in the morning, at noon, in the evening, and at bedtime. 10/29/20  Yes Provider, Historical, [name]  HYDROcodone-acetaminophen (NORCO/VICODIN) 5-325 MG tablet Take 1 tablet by mouth every 4 (four) hours as needed for moderate pain. 01/28/21  Yes Provider, Historical, [name]  insulin aspart (NOVOLOG) 100 UNIT/ML FlexPen Inject 30-70 Units into the skin 3 (three) times daily with meals. Per sliding scale (and an additional 1 unit for every 50 points for a BGL >130) Sliding scale   Yes Provider, Historical, [name]  insulin glargine (LANTUS) 100 UNIT/ML injection Inject 100 Units into the skin in the morning and at bedtime.   Yes Provider, Historical, [name]  levothyroxine (SYNTHROID) 112 MCG tablet Take 112 mcg by mouth in the morning and at bedtime. 08/02/20  Yes Provider, Historical, [name]  metolazone (ZAROXOLYN) 2.5 MG tablet Take Metolazone 2.5mg  - 1 tablet by mouth on 01/10/2021 and 01/13/2021 Patient taking differently: Take 2.5 mg by mouth daily. 01/09/21  Yes  Deboraha Sprang, [name]  metoprolol succinate (TOPROL-XL) 100 MG 24 hr tablet TAKE 1 TABLET EVERY DAY WITH OR IMMEDIATELY AFTER A MEAL Patient taking differently: Take 100 mg by mouth daily. 11/14/20  Yes Deboraha Sprang, [name]  naloxone [facility]) nasal spray 4 mg/0.1 mL Place 4 mg into the nose as needed for opioid reversal. 12/04/20  Yes Provider, Historical, [name]  nitroGLYCERIN (NITROSTAT) 0.4 MG SL tablet Place 1 tablet (0.4 mg total) under the tongue every 5 (five) minutes as needed for chest pain. 04/13/19  Yes Jerline Pain, [name]  PRESCRIPTION MEDICATION CPAP: At bedtime and "as needed for shortness of breath"   Yes Provider, Historical, [name]  torsemide (DEMADEX) 20 MG tablet Take 3 tablets (60 mg total) by mouth every other day. Patient taking differently: Take 80 mg by mouth daily. 07/09/20  Yes Deboraha Sprang, [name]  vitamin B-12 (CYANOCOBALAMIN) 500 MCG tablet Take 500 mcg by mouth daily.   Yes Provider, Historical, [name]  Vitamin D3 (VITAMIN D) 25 MCG tablet Take 1,000 Units by mouth every morning.   Yes Provider, Historical, [name]  Alcohol Swabs (B-D SINGLE USE SWABS REGULAR) PADS  11/24/19   Provider, Historical, [name]  Blood Glucose Calibration (TRUE METRIX LEVEL 1) Low SOLN  11/27/19   Provider, Historical, [name]  cephALEXin (KEFLEX) 250 MG capsule Take 1 capsule (250 mg total) by mouth every 8 (eight) hours. Patient not taking: No sig reported 01/09/21   Deboraha Sprang, [name]  oxyCODONE (ROXICODONE) 5 MG immediate release tablet Take 1 tablet (5 mg total) by mouth every 6 (six) hours as needed for severe pain. Patient not taking: No sig reported 01/14/21   Ventura Bruns, PA-C  TRUE METRIX BLOOD GLUCOSE TEST test strip  09/18/17   Provider, Historical, [name]  TRUEplus Lancets 28G [place]  11/24/19   Provider, Historical, [name]     Allergies:    Allergies  Allergen Reactions   Dulaglutide Nausea Only, Other (See Comments) and Cough    Made the patient sick to her stomach (only) several days after using it Trulicity    Oxycodone Itching    Pt still takes this med even thought it causes itching    Social History:   Social History   Socioeconomic History   Marital status: Married    Spouse name: Jeneen Rinks   Number of children: 3   Years of education: 12   Highest education level: Not on file  Occupational History   Occupation: retired  Tobacco Use   Smoking status: Former    Packs/day: 2.00    Years: 33.00    Pack years: 66.00    Types: Cigarettes    Start date: 1966    Quit date: 05/04/1997    Years since quitting: 23.7   Smokeless tobacco: Never  Vaping Use   Vaping Use: Never used  Substance and Sexual Activity   Alcohol use: No   Drug use: No   Sexual activity: Not on file  Other Topics Concern   Not on file  Social History Narrative   Patient lives at home with her husband Jeneen Rinks .   Retired.   Education 12 th grade.   Right handed   Caffeine two cups of coffee.   Social Determinants of Health   Financial Resource Strain:  Not on file  Food Insecurity: Not on file  Transportation Needs: Not on file  Physical Activity: Not on file  Stress: Not on file  Social Connections: Not on file  Intimate Partner Violence: Not on file    Family History:   The patient's family history includes Diabetes in her maternal aunt and maternal uncle; Heart Problems in her father and mother; Heart defect in her mother; Heart disease in her brother; Lung cancer in her daughter; Throat cancer in her brother. There is no history of Colon cancer, Colon polyps, Esophageal cancer, Rectal cancer, or Stomach cancer.    ROS:  Please see the history of present illness.  General:no colds or fevers, no weight changes Skin:no rashes or ulcers HEENT:no blurred vision, no congestion CV:see HPI PUL:see HPI, OSA  GI:no diarrhea constipation or melena, no indigestion GU:no hematuria, no dysuria MS:no joint pain, no claudication Rt leg always more swollen than Lt,  recent fall with 22 sutures on Rt knee now  out Neuro:no syncope, no lightheadedness Endo:+ diabetes, + thyroid disease  All other ROS reviewed and negative.     Physical Exam/Data:   Vitals:   01/30/21 2000 01/30/21 2350 01/31/21 0400 01/31/21 0750  BP: (!) 172/65 (!) 154/56 (!) 143/70 (!) 138/58  Pulse: 62 63  82  Resp: 20 20 18 20   Temp: 98.4 F (36.9 C) 98.2 F (36.8 C) 97.7 F (36.5 C) 98 F (36.7 C)  TempSrc: Oral Oral Oral Oral  SpO2: 97% 98% 96% 96%  Weight:   111.1 kg   Height:   5\' 5"  (1.651 m)     Intake/Output Summary (Last 24 hours) at 01/31/2021 1015 Last data filed at 01/31/2021 0756 Gross per 24 hour  Intake 823 ml  Output 1900 ml  Net -1077 ml   Last 3 Weights 01/31/2021 01/30/2021 01/30/2021  Weight (lbs) 244 lb 14.9 oz 246 lb 11.1 oz 249 lb  Weight (kg) 111.1 kg 111.9 kg 112.946 kg     Body mass index is 40.76 kg/m.  General:  Well nourished, well developed, in no acute distress while sitting still, with exertion + dyspnea HEENT: normal Neck: no JVD while sitting up  Vascular: No carotid bruits; Distal pulses 2+ bilaterally   Cardiac:  normal S1, S2; RRR; no murmur RRR Lungs:  clear to diminished to auscultation bilaterally, no wheezing, rhonchi or rales  Abd: soft, nontender, no hepatomegaly  Ext: 3+ lower ext edema on Rt with fluid blisters, and Rt knee at prior suture site is now draining.  Musculoskeletal:  No deformities, BUE and BLE strength normal and equal Skin: warm and dry  Neuro:  alert and oriented X 3 MAE follows commands, no focal abnormalities noted Psych:  Normal affect   Relevant CV Studies: Device check  01/30/21  Pacemaker check in clinic. Normal device function. Thresholds, sensing, impedances consistent with previous measurements. Device programmed to maximize longevity. Known PAF, recent increase in burden in setting of acute illness. Device programmed at  appropriate safety margins. Histogram distribution appropriate for patient activity level. Estimated longevity 4 yr, 1  mo. Patient enrolled in remote follow-up. Patient education completed  Echo 10/2020 IMPRESSIONS     1. Left ventricular ejection fraction, by estimation, is 60 to 65%. The  left ventricle has normal function. The left ventricle has no regional  wall motion abnormalities. There is severe concentric left ventricular  hypertrophy. Left ventricular diastolic   parameters are consistent with Grade II diastolic dysfunction  (pseudonormalization). The global longitudinal  strain is normal (assessed  in the A4c view with no foreshortening).   2. Right ventricular systolic function is normal. The right ventricular  size is normal. There is normal pulmonary artery systolic pressure.   3. The mitral valve is grossly normal. No evidence of mitral valve  regurgitation.   4. The aortic valve is tricuspid. Aortic valve regurgitation is not  visualized. No aortic stenosis is present.   Comparison(s): A prior study was performed on 08/13/19. Prior images  reviewed side by side. LV is slightly less dynamic.   FINDINGS   Left Ventricle: Left ventricular ejection fraction, by estimation, is 60  to 65%. The left ventricle has normal function. The left ventricle has no  regional wall motion abnormalities. The global longitudinal strain is  normal. The left ventricular  internal cavity size was normal in size. There is severe concentric left  ventricular hypertrophy. Left ventricular diastolic parameters are  consistent with Grade II diastolic dysfunction (pseudonormalization).   Right Ventricle: The right ventricular size is normal. No increase in  right ventricular wall thickness. Right ventricular systolic function is  normal. There is normal pulmonary artery systolic pressure. The tricuspid  regurgitant velocity is 1.66 m/s, and   with an assumed right atrial pressure of 3 mmHg, the estimated right  ventricular systolic pressure is 27.0 mmHg.   Left Atrium: Left atrial size was normal in size.    Right Atrium: Right atrial size was normal in size.   Pericardium: There is no evidence of pericardial effusion.   Mitral Valve: The mitral valve is grossly normal. No evidence of mitral  valve regurgitation.   Tricuspid Valve: The tricuspid valve is grossly normal. Tricuspid valve  regurgitation is trivial.   Aortic Valve: The aortic valve is tricuspid. Aortic valve regurgitation is  not visualized. No aortic stenosis is present.   Pulmonic Valve: The pulmonic valve was not well visualized. Pulmonic valve  regurgitation is not visualized. No evidence of pulmonic stenosis.   Aorta: The aortic root and ascending aorta are structurally normal, with  no evidence of dilitation.   Laboratory Data:  High Sensitivity Troponin:  No results for input(s): TROPONINIHS in the last 720 hours.    Chemistry Recent Labs  Lab 01/30/21 1755 01/31/21 0357  NA 139 139  K 4.0 3.7  CL 103 103  CO2 23 24  GLUCOSE 176* 166*  BUN 36* 35*  CREATININE 2.03* 1.95*  CALCIUM 9.0 8.9  GFRNONAA 25* 26*  ANIONGAP 13 12    Recent Labs  Lab 01/30/21 1755  PROT 6.5  ALBUMIN 2.9*  AST 17  ALT 15  ALKPHOS 76  BILITOT 0.5   Lipids No results for input(s): CHOL, TRIG, HDL, LABVLDL, LDLCALC, CHOLHDL in the last 168 hours. Hematology Recent Labs  Lab 01/30/21 1154 01/30/21 1755  WBC 9.8 11.9*  RBC 3.95 3.91  HGB 11.3 11.4*  HCT 35.9 37.0  MCV 91 94.6  MCH 28.6 29.2  MCHC 31.5 30.8  RDW 13.4 14.7  PLT 389 394   Thyroid No results for input(s): TSH, FREET4 in the last 168 hours. BNPNo results for input(s): BNP, PROBNP in the last 168 hours.  DDimer No results for input(s): DDIMER in the last 168 hours.   Radiology/Studies:  DG Chest 2 View  Result Date: 01/31/2021 CLINICAL DATA:  Shortness of breath, CHF EXAM: CHEST - 2 VIEW COMPARISON:  10/16/2020 FINDINGS: Left pacer remains in place, unchanged. Heart and mediastinal contours are within normal limits. No focal opacities or  effusions. No acute bony abnormality. IMPRESSION: No active cardiopulmonary disease. Electronically Signed   By: Rolm Baptise M.D.   On: 01/31/2021 08:30   CUP PACEART INCLINIC DEVICE CHECK  Result Date: 01/30/2021 Pacemaker check in clinic. Normal device function. Thresholds, sensing, impedances consistent with previous measurements. Device programmed to maximize longevity. Known PAF, recent increase in burden in setting of acute illness. Device programmed at appropriate safety margins. Histogram distribution appropriate for patient activity level. Estimated longevity 4 yr, 1 mo. Patient enrolled in remote follow-up. Patient education completed.    Assessment and Plan:   Acute CHF/HFpEF acute on chronic -- on lasix 80 mg BID, she is neg 1077 and wt down .8 kg.   Continue to diuresis.  Was on torsemide at home.  Needs higher dose of lasix vs drip and perhaps metolazone.   Wound on Rt knee from fall and sutures removed, now with drainage, will ask wound care RN to eval. Has fluid blisters on Rt leg.  WBC was 11.9 yesterday.   Some chest pain with ambulation, she has taken NTG at home.  Could be from volume overload will eval once she has diuresed. Cath in 2006, neg nuc 2013 Cellulitis treated on keflex for 5 days has improved. PAF now in SR with atrial pacing.  Has had atrial fib as well. On amiodarone Sinus node dysfunction with PPM (MDT) and stable, interrogated yesterday.  CKD-4 but improved from admit with IV diuresis.  COPD.Hypothyroidism stable last TSH 01/15/21 was 0.524  OSA with CPAP HTN controlled.  DM-2 with A1C at 8.4  glucose 237 to 129 on SSI insulin  on lantus at home BID  most likely will need lantus here will ask diabetes coordinator to assist   Risk Assessment/Risk Scores:  { Severity of Illness:  Admit as inpt for acute on chronic HFpEF and PAF on anticoagulation.  DOE with minimal activity.    or questions or updates, please contact [place] Please consult  [email or web address] for contact info under     Signed, Cecilie Kicks, NP  01/31/2021 10:15 AM

## 2021-01-31 NOTE — Progress Notes (Signed)
Mobility Specialist Progress Note:   01/31/21 0900  Mobility  Activity Ambulated in hall  Range of Motion/Exercises Active;All extremities  Level of Assistance Standby assist, set-up cues, supervision of patient - no hands on  Assistive Device Front wheel walker  Minutes Ambulated 8 minutes  Distance Ambulated (ft) 160 ft  Mobility Ambulated with assistance in hallway  Mobility Response Tolerated well  Mobility performed by Mobility specialist  Bed Position Chair  Transport method Ambulatory  $Mobility charge 1 Mobility   Pt was received in bed and agreed to mobility. Ambulated in hallway 160' with RW on RA. Pt stated she was extremely SOB and required multiple standing breaks. Tolerated well. Pt left in chair with all needs met.   During Mobility: HR 97 bpm Post Mobility: HR 72 bpm  Nelta Numbers Mobility Specialist  Phone [phone]

## 2021-01-31 NOTE — Progress Notes (Signed)
Inpatient Diabetes Program Recommendations  AACE/ADA: New Consensus Statement on Inpatient Glycemic Control (2015)  Target Ranges:  Prepandial:   less than 140 mg/dL      Peak postprandial:   less than 180 mg/dL (1-2 hours)      Critically ill patients:  140 - 180 mg/dL   Lab Results  Component Value Date   GLUCAP 267 (H) 01/31/2021   HGBA1C 8.4 (H) 01/30/2021    Review of Glycemic Control Results for Kayla [name], Kayla [name] (MRN [id number]) as of 01/31/2021 12:23  Ref. Range 01/30/2021 17:12 01/30/2021 22:03 01/31/2021 05:45 01/31/2021 11:25  Glucose-Capillary Latest Ref Range: 70 - 99 mg/dL 200 (H) 237 (H) 129 (H) 267 (H)   Diabetes history: DM2 Outpatient Diabetes medications: Lantus 100 units bid Current orders for Inpatient glycemic control: Novolog correction 0-20 units tid + hs 0-5 units  Inpatient Diabetes Program Recommendations:   Received consult regarding insulin dosing while in the hospital. Consider: -Lantus 22 units (.2 units/kg x 111.1 kg = 22 units) Secure text sent to Cecilie Kicks NP.  Thank you, Nani Gasser. Marlita Keil, RN, MSN, CDE  Diabetes Coordinator Inpatient Glycemic Control Team Team Pager [phone] (8am-5pm) 01/31/2021 12:22 PM

## 2021-01-31 NOTE — Progress Notes (Signed)
Heart Failure Nurse Navigator Progress Note  PCP: Hamrick, Lorin Mercy, MD PCP-Cardiologist: Derl Barrow., MD Admission Diagnosis: HFpEF + PAF Admitted from: home with spouse  Presentation:   Kayla [name] presented 9/29 with increased SOB and BLE edema with blisters. Pt and spouse interactive with interview process. Pt states she takes medications as prescribed. Spend 20 minutes educating patient and spouse about low sodium diet and fluid restrictions. Pt states she had no idea she should be watching her salt intake. Eats eggs (with salt added), bacon/sausage most mornings. 2 cups coffee. Lunch is processed lunch meat, hotdogs 2x per week with 2 diet cokes. Pt states they eat out often and she likes the salt shaker. Also drinks 8 glasses of water per day. Reviewed HF patient education book, gave better options, encouraged fluid and sodium restrictions. Pt was recently DC from [facility] and [place]. Explained balance between HF, renal (CKD IV) and medications. Explained benefits for HV TOC, pt states she doesn't like going to the clinic/doctor. Unsure if she wants to come. States transportation from [place], [place] is not an issue. Will follow to see if HV TOC can assist pt with frequent admissions by having a quick follow up appointment s/p DC.    ECHO/ LVEF: 60-65%, G2DD  Clinical Course:  Past Medical History:  Diagnosis Date   A-fib ([place])    Allergy    Anxiety    Arthritis    CAD (coronary artery disease)    Mild per remote cath in 2006, /    Nuclear, January, 2013, no significant abnormality,   Carotid artery disease ([place])    Doppler, January, 2013, 0 71% RIC A., 24-58% LICA, stable   Cataract    CHF (congestive heart failure) ([place])    Complication of anesthesia    wakes up "mean"   COPD 02/16/2007   Intolerant of Spiriva, tudorza Intolerant of Trelegy    COPD (chronic obstructive pulmonary disease) ([place])    Coronary  atherosclerosis 11/24/2008   Catheterization 2006, nonobstructive coronary disease   //   nuclear 2013, low risk     Diabetes mellitus, type 2 ([place])    Diverticula of colon    DIVERTICULOSIS OF COLON 03/23/2007   Qualifier: Diagnosis of  By: Halford Chessman MD, [place]     Dizziness    occasional   Dizzy spells 05/04/2011   DM (diabetes mellitus) ([place]) 03/23/2007   Qualifier: Diagnosis of  By: Halford Chessman MD, Vineet     Dyspnea    On exertion   Ejection fraction    Essential hypertension 02/16/2007   Qualifier: Diagnosis of  By: Rosana Hoes CMA, Tammy     G E R D 03/23/2007   Qualifier: Diagnosis of  By: Halford Chessman MD, Vine[place]     Gall bladder disease 04/28/2016   Gastritis and gastroduodenitis    GERD (gastroesophageal reflux disease)    Goiter    hx of   Headache(784.0)    migraine   Hyperlipidemia    Hypertension    Hypothyroidism    HYPOTHYROIDISM, POSTSURGICAL 06/04/2008   Qualifier: Diagnosis of  By: Loanne Drilling MD, Sean A    Left ventricular hypertrophy    Leg edema    occasional swelling   Leg swelling 12/09/2018   Myofascial pain syndrome, cervical 01/06/2018   Obesity    OBESITY 11/24/2008   Qualifier: Diagnosis of  By: Jaramillo, [place] 02/16/2007  Auto CPAP 09/03/13 to 10/02/13 >> used on 30 of 30 nights with average 6 hrs and 3 min.  Average AHI is 3.1 with median CPAP 5 cm H2O and 95 th percentile CPAP 6 cm H2O.     Occipital neuralgia of right side 01/06/2018   OSA (obstructive sleep apnea)    cpap setting of 12   Pain in joint of right hip 01/21/2018   Palpitations 01/21/2011   48 hour Holter, 2015, scattered PACs and PVCs.    Pneumonia    PONV (postoperative nausea and vomiting)    severe after every surgery   Pulmonary hypertension, secondary    RHINITIS 07/21/2010        RUQ abdominal pain    S/P left TKA 07/27/2011   Sinus node dysfunction (HCC) 11/08/2015   Sleep apnea    Spinal stenosis of cervical region 11/07/2014   Urinary frequency 12/09/2018   UTI (urinary tract  infection) 08/15/2019   per pt report   VENTRICULAR HYPERTROPHY, LEFT 11/24/2008   Qualifier: Diagnosis of  By: Sidney Ace       Social History   Socioeconomic History   Marital status: Married    Spouse name: Hanaan Gancarz   Number of children: 3   Years of education: 12   Highest education level: Not on file  Occupational History   Occupation: retired  Tobacco Use   Smoking status: Former    Packs/day: 2.00    Years: 33.00    Pack years: 66.00    Types: Cigarettes    Start date: 1966    Quit date: 05/04/1997    Years since quitting: 23.7   Smokeless tobacco: Never  Vaping Use   Vaping Use: Never used  Substance and Sexual Activity   Alcohol use: No   Drug use: No   Sexual activity: Not on file  Other Topics Concern   Not on file  Social History Narrative   Patient lives at home with her husband Jeneen Rinks .   Retired.   Education 12 th grade.   Right handed   Caffeine two cups of coffee.   Social Determinants of Health   Financial Resource Strain: Low Risk    Difficulty of Paying Living Expenses: Not very hard  Food Insecurity: No Food Insecurity   Worried About Charity fundraiser in the Last Year: Never true   Ran Out of Food in the Last Year: Never true  Transportation Needs: No Transportation Needs   Lack of Transportation (Medical): No   Lack of Transportation (Non-Medical): No  Physical Activity: Not on file  Stress: Not on file  Social Connections: Not on file    High Risk Criteria for Readmission and/or Poor Patient Outcomes: Heart failure hospital admissions (last 6 months): 3  No Show rate: 5% Difficult social situation: no Demonstrates medication adherence: yes Primary Language: English Literacy level: able to read/write and comprehend.  Education Assessment and Provision:  Detailed education and instructions provided on heart failure disease management including the following:  Signs and symptoms of Heart Failure When to call the  physician Importance of daily weights Low sodium diet Fluid restriction Medication management Anticipated future follow-up appointments  Patient education given on each of the above topics.  Patient acknowledges understanding via teach back method and acceptance of all instructions.  Education Materials:  "Living Better With Heart Failure" Booklet, HF zone tool, & Daily Weight Tracker Tool.  Patient has scale at home: yes Patient has pill box at home: yes  Barriers of Care:   -dietary indiscretion -fluid indiscretion  Considerations/Referrals:   Referral made to Heart Failure Pharmacist Stewardship: no Referral made to Heart Failure CSW/NCM TOC: no Referral made to Heart & Vascular TOC clinic: pending, pt states she does not like clinics, explained benefits to patient as she has had multiple hospitalizations for volume overload.   Items for Follow-up on DC/TOC: -optimize -dietary/fluid modifications   Pricilla Holm, MSN, RN Heart Failure Nurse Navigator [phone]

## 2021-01-31 NOTE — Consult Note (Signed)
[facility] Nurse Consult Note: Patient receiving care in [place] Reason for Consult: Right leg wound Wound type: Original wound was a laceration from a fall (see photo from 01/14/21) which was sutured at that time. The sutures were removed on 9/26 and at this time the wound is covered with Escar and is surrounded with erythema. She also has a blister in the medial lower right leg. Dressing procedure/placement/frequency: Clean the right knee wound with soap and water, pat dry. Apply a nickel thick layer of Santyl, cover with a moistened saline gauze and secure with a Kerlix wrap. Change daily RLE apply a piece of Xeroform gauze over the blistered area, do not puncture or drain (allow to drain on its own) then wrap with a few wraps of Kerlix to hold in place. Change daily.   Monitor the wound area(s) for worsening of condition such as: Signs/symptoms of infection, increase in size, development of or worsening of odor, development of pain, or increased pain at the affected locations.   Notify the medical team if any of these develop.  Thank you for the consult. [place] nurse will not follow at this time.   Please re-consult the [place] team if needed.  Cathlean Marseilles Tamala Julian, MSN, RN, [place], Lysle Pearl, [facility] Wound Treatment Associate Pager [phone]

## 2021-01-31 NOTE — Progress Notes (Signed)
Patient refused scheduled 22:00 hr Eliquis and Allopurinol patient stated she has already taken them twice today.

## 2021-02-01 ENCOUNTER — Other Ambulatory Visit: Payer: Self-pay | Admitting: Physician Assistant

## 2021-02-01 DIAGNOSIS — I5033 Acute on chronic diastolic (congestive) heart failure: Secondary | ICD-10-CM

## 2021-02-01 LAB — CBC
HCT: 36.2 % (ref 36.0–46.0)
Hemoglobin: 11.3 g/dL — ABNORMAL LOW (ref 12.0–15.0)
MCH: 29 pg (ref 26.0–34.0)
MCHC: 31.2 g/dL (ref 30.0–36.0)
MCV: 93.1 fL (ref 80.0–100.0)
Platelets: 372 10*3/uL (ref 150–400)
RBC: 3.89 MIL/uL (ref 3.87–5.11)
RDW: 14.7 % (ref 11.5–15.5)
WBC: 10.8 10*3/uL — ABNORMAL HIGH (ref 4.0–10.5)
nRBC: 0 % (ref 0.0–0.2)

## 2021-02-01 LAB — BASIC METABOLIC PANEL
Anion gap: 10 (ref 5–15)
BUN: 36 mg/dL — ABNORMAL HIGH (ref 8–23)
CO2: 27 mmol/L (ref 22–32)
Calcium: 9.1 mg/dL (ref 8.9–10.3)
Chloride: 101 mmol/L (ref 98–111)
Creatinine, Ser: 1.92 mg/dL — ABNORMAL HIGH (ref 0.44–1.00)
GFR, Estimated: 26 mL/min — ABNORMAL LOW (ref >60)
Glucose, Bld: 187 mg/dL — ABNORMAL HIGH (ref 70–99)
Potassium: 4 mmol/L (ref 3.5–5.1)
Sodium: 138 mmol/L (ref 135–145)

## 2021-02-01 LAB — GLUCOSE, CAPILLARY
Glucose-Capillary: 189 mg/dL — ABNORMAL HIGH (ref 70–99)
Glucose-Capillary: 356 mg/dL — ABNORMAL HIGH (ref 70–99)

## 2021-02-01 MED ORDER — TORSEMIDE 40 MG PO TABS: 80.0000 mg | ORAL_TABLET | Freq: Every day | ORAL | 3 refills | Status: DC

## 2021-02-01 MED ORDER — METOLAZONE 2.5 MG PO TABS: 2.5000 mg | ORAL_TABLET | ORAL | 0 refills | Status: DC
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
## 2021-02-01 NOTE — Progress Notes (Signed)
Pt verbalized wanting to go home. Requested to hold off Lasix dose this morning until MD comes to see her.

## 2021-02-01 NOTE — Discharge Instructions (Signed)
Discharge diuretic include 80mg  daily of torsemide and metolazone 2.5mg  EVERY OTHER DAY taken 30 min prior to the torsemide  Heart failure management: Low or no salt diet Limit daily fluid intake to between 32 - 64 oz Daily weight every morning after waking up, call cardiology if your weight increase by 3 lbs over night or 5 lbs in a single week Bring your weight diary to every cardiology appointment

## 2021-02-03 ENCOUNTER — Telehealth: Payer: Self-pay | Admitting: Cardiology

## 2021-02-03 DIAGNOSIS — S81811D Laceration without foreign body, right lower leg, subsequent encounter: Secondary | ICD-10-CM | POA: Diagnosis not present

## 2021-02-03 NOTE — Telephone Encounter (Signed)
TOC per Almyra Deforest scheduled with [name] for 02/10/21 at 8:45am

## 2021-02-03 NOTE — Telephone Encounter (Signed)
TOC call to patient left message on personal voice mail to call back.

## 2021-02-06 NOTE — Telephone Encounter (Signed)
Patient contacted regarding discharge from [facility] on 02/01/2021.  Patient understands to follow up with provider [name], NP on 10/10 at 8:45 AM at [facility]. Patient understands discharge instructions? yes Patient understands medications and regiment? yes Patient understands to bring all medications to this visit? yes
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
## 2021-02-10 ENCOUNTER — Ambulatory Visit: Payer: Medicare HMO | Admitting: Adult Health

## 2021-02-10 ENCOUNTER — Encounter: Payer: Self-pay | Admitting: Adult Health

## 2021-02-10 ENCOUNTER — Other Ambulatory Visit: Payer: Self-pay

## 2021-02-10 VITALS — BP 146/60 | HR 83 | Ht 65.0 in | Wt 242.4 lb

## 2021-02-10 DIAGNOSIS — L03115 Cellulitis of right lower limb: Secondary | ICD-10-CM | POA: Diagnosis not present

## 2021-02-10 DIAGNOSIS — Z79899 Other long term (current) drug therapy: Secondary | ICD-10-CM

## 2021-02-10 DIAGNOSIS — L02415 Cutaneous abscess of right lower limb: Secondary | ICD-10-CM

## 2021-02-10 DIAGNOSIS — I10 Essential (primary) hypertension: Secondary | ICD-10-CM | POA: Diagnosis not present

## 2021-02-10 DIAGNOSIS — I480 Paroxysmal atrial fibrillation: Secondary | ICD-10-CM

## 2021-02-10 DIAGNOSIS — I5032 Chronic diastolic (congestive) heart failure: Secondary | ICD-10-CM

## 2021-02-10 LAB — BASIC METABOLIC PANEL
BUN/Creatinine Ratio: 22 (ref 12–28)
BUN: 44 mg/dL — ABNORMAL HIGH (ref 8–27)
CO2: 24 mmol/L (ref 20–29)
Calcium: 9.6 mg/dL (ref 8.7–10.3)
Chloride: 96 mmol/L (ref 96–106)
Creatinine, Ser: 1.97 mg/dL — ABNORMAL HIGH (ref 0.57–1.00)
Glucose: 263 mg/dL — ABNORMAL HIGH (ref 70–99)
Potassium: 4.3 mmol/L (ref 3.5–5.2)
Sodium: 139 mmol/L (ref 134–144)
eGFR: 25 mL/min/{1.73_m2} — ABNORMAL LOW (ref >59)

## 2021-02-10 MED ORDER — CLARITHROMYCIN 250 MG PO TABS: 250.0000 mg | ORAL_TABLET | Freq: Two times a day (BID) | ORAL | 0 refills | Status: DC

## 2021-02-10 MED ORDER — CEPHALEXIN 500 MG PO CAPS: 500.0000 mg | ORAL_CAPSULE | Freq: Two times a day (BID) | ORAL | 0 refills | Status: DC

## 2021-02-10 NOTE — Patient Instructions (Signed)
Medication Instructions:  Start  Clarithromycin 250 mg ( 1 Tablet (2) Two Times Daily. For 14 Days.) *If you need a refill on your cardiac medications before your next appointment, please call your pharmacy*   Lab Work: BMET: Today If you have labs (blood work) drawn today and your tests are completely normal, you will receive your results only by: [place] (if you have MyChart) OR A paper copy in the mail If you have any lab test that is abnormal or we need to change your treatment, we will call you to review the results.   Testing/Procedures: No Testing    Follow-Up: At [facility], you and your health needs are our priority.  As part of our continuing mission to provide you with exceptional heart care, we have created designated Provider Care Teams.  These Care Teams include your primary Cardiologist (physician) and Advanced Practice Providers (APPs -  Physician Assistants and Nurse Practitioners) who all work together to provide you with the care you need, when you need it.  We recommend signing up for the patient portal called "MyChart".  Sign up information is provided on this After Visit Summary.  MyChart is used to connect with patients for Virtual Visits (Telemedicine).  Patients are able to view lab/test results, encounter notes, upcoming appointments, etc.  Non-urgent messages can be sent to your provider as well.   To learn more about what you can do with MyChart, go to [email or web address].    Your next appointment:   1 month(s)  The format for your next appointment:   In Person  Provider:   Candee Furbish, MD

## 2021-02-11 DIAGNOSIS — Z23 Encounter for immunization: Secondary | ICD-10-CM | POA: Diagnosis not present

## 2021-02-12 ENCOUNTER — Telehealth: Payer: Self-pay

## 2021-02-12 DIAGNOSIS — Z79899 Other long term (current) drug therapy: Secondary | ICD-10-CM

## 2021-02-12 NOTE — Telephone Encounter (Addendum)
Called patient regarding blood test results patient will need repeat BMET in Three months.----- Message from Lendon Colonel, NP sent at 02/10/2021  5:17 PM EDT ----- I have reviewed her labs. She has elevated blood glucose. This lab is not drawn fasting but she be followed up with by PCP Creatinine remains elevated but stable. Continue torsemide and metolazone as directed., Will need follow up BMET in 3 months for medication management.  KL

## 2021-02-13 DIAGNOSIS — I10 Essential (primary) hypertension: Secondary | ICD-10-CM | POA: Diagnosis not present

## 2021-02-13 DIAGNOSIS — I5030 Unspecified diastolic (congestive) heart failure: Secondary | ICD-10-CM | POA: Diagnosis not present

## 2021-02-13 DIAGNOSIS — I495 Sick sinus syndrome: Secondary | ICD-10-CM | POA: Diagnosis not present

## 2021-02-13 DIAGNOSIS — E1121 Type 2 diabetes mellitus with diabetic nephropathy: Secondary | ICD-10-CM | POA: Diagnosis not present

## 2021-02-13 DIAGNOSIS — Z79899 Other long term (current) drug therapy: Secondary | ICD-10-CM | POA: Diagnosis not present

## 2021-02-13 DIAGNOSIS — N184 Chronic kidney disease, stage 4 (severe): Secondary | ICD-10-CM | POA: Diagnosis not present

## 2021-02-13 DIAGNOSIS — Z6839 Body mass index (BMI) 39.0-39.9, adult: Secondary | ICD-10-CM | POA: Diagnosis not present

## 2021-02-13 DIAGNOSIS — E782 Mixed hyperlipidemia: Secondary | ICD-10-CM | POA: Diagnosis not present

## 2021-02-13 DIAGNOSIS — J449 Chronic obstructive pulmonary disease, unspecified: Secondary | ICD-10-CM | POA: Diagnosis not present

## 2021-02-17 DIAGNOSIS — S81811D Laceration without foreign body, right lower leg, subsequent encounter: Secondary | ICD-10-CM | POA: Diagnosis not present

## 2021-02-21 DIAGNOSIS — M1710 Unilateral primary osteoarthritis, unspecified knee: Secondary | ICD-10-CM | POA: Diagnosis not present

## 2021-02-21 DIAGNOSIS — G894 Chronic pain syndrome: Secondary | ICD-10-CM | POA: Diagnosis not present

## 2021-02-21 DIAGNOSIS — E349 Endocrine disorder, unspecified: Secondary | ICD-10-CM | POA: Diagnosis not present

## 2021-02-21 DIAGNOSIS — Z79891 Long term (current) use of opiate analgesic: Secondary | ICD-10-CM | POA: Diagnosis not present

## 2021-02-21 DIAGNOSIS — M353 Polymyalgia rheumatica: Secondary | ICD-10-CM | POA: Diagnosis not present

## 2021-02-21 DIAGNOSIS — M8580 Other specified disorders of bone density and structure, unspecified site: Secondary | ICD-10-CM | POA: Diagnosis not present

## 2021-02-21 DIAGNOSIS — Z1389 Encounter for screening for other disorder: Secondary | ICD-10-CM | POA: Diagnosis not present

## 2021-02-21 DIAGNOSIS — M549 Dorsalgia, unspecified: Secondary | ICD-10-CM | POA: Diagnosis not present

## 2021-02-25 DIAGNOSIS — I5030 Unspecified diastolic (congestive) heart failure: Secondary | ICD-10-CM | POA: Diagnosis not present

## 2021-02-25 DIAGNOSIS — D631 Anemia in chronic kidney disease: Secondary | ICD-10-CM | POA: Diagnosis not present

## 2021-02-25 DIAGNOSIS — N2581 Secondary hyperparathyroidism of renal origin: Secondary | ICD-10-CM | POA: Diagnosis not present

## 2021-02-25 DIAGNOSIS — E785 Hyperlipidemia, unspecified: Secondary | ICD-10-CM | POA: Diagnosis not present

## 2021-02-25 DIAGNOSIS — N1832 Chronic kidney disease, stage 3b: Secondary | ICD-10-CM | POA: Diagnosis not present

## 2021-02-25 DIAGNOSIS — I129 Hypertensive chronic kidney disease with stage 1 through stage 4 chronic kidney disease, or unspecified chronic kidney disease: Secondary | ICD-10-CM | POA: Diagnosis not present

## 2021-02-25 DIAGNOSIS — N390 Urinary tract infection, site not specified: Secondary | ICD-10-CM | POA: Diagnosis not present

## 2021-02-25 DIAGNOSIS — R809 Proteinuria, unspecified: Secondary | ICD-10-CM | POA: Diagnosis not present

## 2021-03-04 DIAGNOSIS — E113213 Type 2 diabetes mellitus with mild nonproliferative diabetic retinopathy with macular edema, bilateral: Secondary | ICD-10-CM | POA: Diagnosis not present

## 2021-03-04 DIAGNOSIS — H353132 Nonexudative age-related macular degeneration, bilateral, intermediate dry stage: Secondary | ICD-10-CM | POA: Diagnosis not present

## 2021-03-06 ENCOUNTER — Other Ambulatory Visit: Payer: Self-pay

## 2021-03-06 MED ORDER — AMLODIPINE BESYLATE 5 MG PO TABS: 5.0000 mg | ORAL_TABLET | Freq: Every day | ORAL | 3 refills | Status: DC

## 2021-03-10 ENCOUNTER — Other Ambulatory Visit: Payer: Self-pay

## 2021-03-10 MED ORDER — AMLODIPINE BESYLATE 5 MG PO TABS: 5.0000 mg | ORAL_TABLET | Freq: Every day | ORAL | 3 refills | Status: DC

## 2021-03-17 DIAGNOSIS — R0789 Other chest pain: Secondary | ICD-10-CM | POA: Diagnosis not present

## 2021-03-17 DIAGNOSIS — R059 Cough, unspecified: Secondary | ICD-10-CM | POA: Diagnosis not present

## 2021-03-18 ENCOUNTER — Inpatient Hospital Stay (HOSPITAL_COMMUNITY)
Admission: EM | Admit: 2021-03-18 | Discharge: 2021-03-22 | DRG: 190 | Disposition: A | Discharge status: Home Health | Payer: Medicare HMO | Source: Ambulatory Visit | Attending: Internal Medicine | Admitting: Internal Medicine

## 2021-03-18 ENCOUNTER — Ambulatory Visit
Admission: EM | Admit: 2021-03-18 | Discharge: 2021-03-18 | Disposition: A | Discharge status: Home / Self Care | Payer: Medicare HMO

## 2021-03-18 ENCOUNTER — Encounter (HOSPITAL_COMMUNITY): Payer: Self-pay

## 2021-03-18 ENCOUNTER — Other Ambulatory Visit: Payer: Self-pay

## 2021-03-18 ENCOUNTER — Emergency Department (HOSPITAL_COMMUNITY): Payer: Medicare HMO

## 2021-03-18 DIAGNOSIS — I152 Hypertension secondary to endocrine disorders: Secondary | ICD-10-CM | POA: Diagnosis present

## 2021-03-18 DIAGNOSIS — E119 Type 2 diabetes mellitus without complications: Secondary | ICD-10-CM

## 2021-03-18 DIAGNOSIS — K5730 Diverticulosis of large intestine without perforation or abscess without bleeding: Secondary | ICD-10-CM | POA: Diagnosis present

## 2021-03-18 DIAGNOSIS — R0609 Other forms of dyspnea: Secondary | ICD-10-CM | POA: Diagnosis not present

## 2021-03-18 DIAGNOSIS — E1169 Type 2 diabetes mellitus with other specified complication: Secondary | ICD-10-CM | POA: Diagnosis present

## 2021-03-18 DIAGNOSIS — M353 Polymyalgia rheumatica: Secondary | ICD-10-CM | POA: Diagnosis present

## 2021-03-18 DIAGNOSIS — D631 Anemia in chronic kidney disease: Secondary | ICD-10-CM | POA: Diagnosis present

## 2021-03-18 DIAGNOSIS — R0902 Hypoxemia: Secondary | ICD-10-CM

## 2021-03-18 DIAGNOSIS — Z981 Arthrodesis status: Secondary | ICD-10-CM

## 2021-03-18 DIAGNOSIS — Z20822 Contact with and (suspected) exposure to covid-19: Secondary | ICD-10-CM | POA: Diagnosis present

## 2021-03-18 DIAGNOSIS — E1122 Type 2 diabetes mellitus with diabetic chronic kidney disease: Secondary | ICD-10-CM | POA: Diagnosis present

## 2021-03-18 DIAGNOSIS — Z833 Family history of diabetes mellitus: Secondary | ICD-10-CM

## 2021-03-18 DIAGNOSIS — I495 Sick sinus syndrome: Secondary | ICD-10-CM | POA: Diagnosis not present

## 2021-03-18 DIAGNOSIS — I2510 Atherosclerotic heart disease of native coronary artery without angina pectoris: Secondary | ICD-10-CM | POA: Diagnosis present

## 2021-03-18 DIAGNOSIS — I480 Paroxysmal atrial fibrillation: Secondary | ICD-10-CM | POA: Diagnosis not present

## 2021-03-18 DIAGNOSIS — R079 Chest pain, unspecified: Secondary | ICD-10-CM | POA: Diagnosis not present

## 2021-03-18 DIAGNOSIS — Z87440 Personal history of urinary (tract) infections: Secondary | ICD-10-CM

## 2021-03-18 DIAGNOSIS — E669 Obesity, unspecified: Secondary | ICD-10-CM | POA: Diagnosis present

## 2021-03-18 DIAGNOSIS — Z90710 Acquired absence of both cervix and uterus: Secondary | ICD-10-CM

## 2021-03-18 DIAGNOSIS — Z66 Do not resuscitate: Secondary | ICD-10-CM | POA: Diagnosis present

## 2021-03-18 DIAGNOSIS — Z79891 Long term (current) use of opiate analgesic: Secondary | ICD-10-CM

## 2021-03-18 DIAGNOSIS — E785 Hyperlipidemia, unspecified: Secondary | ICD-10-CM | POA: Diagnosis present

## 2021-03-18 DIAGNOSIS — I5032 Chronic diastolic (congestive) heart failure: Secondary | ICD-10-CM | POA: Diagnosis present

## 2021-03-18 DIAGNOSIS — J9601 Acute respiratory failure with hypoxia: Secondary | ICD-10-CM | POA: Diagnosis not present

## 2021-03-18 DIAGNOSIS — Z79899 Other long term (current) drug therapy: Secondary | ICD-10-CM

## 2021-03-18 DIAGNOSIS — R072 Precordial pain: Secondary | ICD-10-CM

## 2021-03-18 DIAGNOSIS — Z87891 Personal history of nicotine dependence: Secondary | ICD-10-CM

## 2021-03-18 DIAGNOSIS — R0789 Other chest pain: Secondary | ICD-10-CM | POA: Diagnosis not present

## 2021-03-18 DIAGNOSIS — E1142 Type 2 diabetes mellitus with diabetic polyneuropathy: Secondary | ICD-10-CM | POA: Diagnosis present

## 2021-03-18 DIAGNOSIS — J441 Chronic obstructive pulmonary disease with (acute) exacerbation: Secondary | ICD-10-CM | POA: Diagnosis not present

## 2021-03-18 DIAGNOSIS — E890 Postprocedural hypothyroidism: Secondary | ICD-10-CM | POA: Diagnosis present

## 2021-03-18 DIAGNOSIS — N179 Acute kidney failure, unspecified: Secondary | ICD-10-CM | POA: Diagnosis present

## 2021-03-18 DIAGNOSIS — K219 Gastro-esophageal reflux disease without esophagitis: Secondary | ICD-10-CM | POA: Diagnosis present

## 2021-03-18 DIAGNOSIS — L03115 Cellulitis of right lower limb: Secondary | ICD-10-CM | POA: Diagnosis not present

## 2021-03-18 DIAGNOSIS — N1832 Chronic kidney disease, stage 3b: Secondary | ICD-10-CM | POA: Diagnosis present

## 2021-03-18 DIAGNOSIS — J440 Chronic obstructive pulmonary disease with acute lower respiratory infection: Secondary | ICD-10-CM | POA: Diagnosis present

## 2021-03-18 DIAGNOSIS — G8929 Other chronic pain: Secondary | ICD-10-CM | POA: Diagnosis present

## 2021-03-18 DIAGNOSIS — N183 Chronic kidney disease, stage 3 unspecified: Secondary | ICD-10-CM | POA: Diagnosis present

## 2021-03-18 DIAGNOSIS — R059 Cough, unspecified: Secondary | ICD-10-CM | POA: Diagnosis not present

## 2021-03-18 DIAGNOSIS — R0602 Shortness of breath: Secondary | ICD-10-CM | POA: Diagnosis not present

## 2021-03-18 DIAGNOSIS — G4733 Obstructive sleep apnea (adult) (pediatric): Secondary | ICD-10-CM | POA: Diagnosis present

## 2021-03-18 DIAGNOSIS — Z8701 Personal history of pneumonia (recurrent): Secondary | ICD-10-CM

## 2021-03-18 DIAGNOSIS — M7989 Other specified soft tissue disorders: Secondary | ICD-10-CM

## 2021-03-18 DIAGNOSIS — Z950 Presence of cardiac pacemaker: Secondary | ICD-10-CM

## 2021-03-18 DIAGNOSIS — Z7901 Long term (current) use of anticoagulants: Secondary | ICD-10-CM

## 2021-03-18 DIAGNOSIS — E1165 Type 2 diabetes mellitus with hyperglycemia: Secondary | ICD-10-CM | POA: Diagnosis present

## 2021-03-18 DIAGNOSIS — G9341 Metabolic encephalopathy: Secondary | ICD-10-CM | POA: Diagnosis not present

## 2021-03-18 DIAGNOSIS — Z8249 Family history of ischemic heart disease and other diseases of the circulatory system: Secondary | ICD-10-CM

## 2021-03-18 DIAGNOSIS — R051 Acute cough: Secondary | ICD-10-CM

## 2021-03-18 DIAGNOSIS — M1990 Unspecified osteoarthritis, unspecified site: Secondary | ICD-10-CM | POA: Diagnosis present

## 2021-03-18 DIAGNOSIS — I272 Pulmonary hypertension, unspecified: Secondary | ICD-10-CM | POA: Diagnosis present

## 2021-03-18 DIAGNOSIS — Z794 Long term (current) use of insulin: Secondary | ICD-10-CM

## 2021-03-18 DIAGNOSIS — Z888 Allergy status to other drugs, medicaments and biological substances status: Secondary | ICD-10-CM

## 2021-03-18 DIAGNOSIS — Z885 Allergy status to narcotic agent status: Secondary | ICD-10-CM

## 2021-03-18 DIAGNOSIS — Z89412 Acquired absence of left great toe: Secondary | ICD-10-CM

## 2021-03-18 DIAGNOSIS — E1159 Type 2 diabetes mellitus with other circulatory complications: Secondary | ICD-10-CM | POA: Diagnosis present

## 2021-03-18 DIAGNOSIS — I10 Essential (primary) hypertension: Secondary | ICD-10-CM | POA: Diagnosis present

## 2021-03-18 LAB — CBC WITH DIFFERENTIAL/PLATELET
Abs Immature Granulocytes: 0.12 10*3/uL — ABNORMAL HIGH (ref 0.00–0.07)
Basophils Absolute: 0.1 10*3/uL (ref 0.0–0.1)
Basophils Relative: 1 %
Eosinophils Absolute: 0.1 10*3/uL (ref 0.0–0.5)
Eosinophils Relative: 1 %
HCT: 34.9 % — ABNORMAL LOW (ref 36.0–46.0)
Hemoglobin: 10.8 g/dL — ABNORMAL LOW (ref 12.0–15.0)
Immature Granulocytes: 1 %
Lymphocytes Relative: 9 %
Lymphs Abs: 1.4 10*3/uL (ref 0.7–4.0)
MCH: 29.3 pg (ref 26.0–34.0)
MCHC: 30.9 g/dL (ref 30.0–36.0)
MCV: 94.6 fL (ref 80.0–100.0)
Monocytes Absolute: 1.3 10*3/uL — ABNORMAL HIGH (ref 0.1–1.0)
Monocytes Relative: 8 %
Neutro Abs: 12.8 10*3/uL — ABNORMAL HIGH (ref 1.7–7.7)
Neutrophils Relative %: 80 %
Platelets: 388 10*3/uL (ref 150–400)
RBC: 3.69 MIL/uL — ABNORMAL LOW (ref 3.87–5.11)
RDW: 16.8 % — ABNORMAL HIGH (ref 11.5–15.5)
WBC: 15.8 10*3/uL — ABNORMAL HIGH (ref 4.0–10.5)
nRBC: 0 % (ref 0.0–0.2)

## 2021-03-18 LAB — BASIC METABOLIC PANEL
Anion gap: 11 (ref 5–15)
BUN: 38 mg/dL — ABNORMAL HIGH (ref 8–23)
CO2: 26 mmol/L (ref 22–32)
Calcium: 9.6 mg/dL (ref 8.9–10.3)
Chloride: 98 mmol/L (ref 98–111)
Creatinine, Ser: 1.48 mg/dL — ABNORMAL HIGH (ref 0.44–1.00)
GFR, Estimated: 36 mL/min — ABNORMAL LOW (ref >60)
Glucose, Bld: 279 mg/dL — ABNORMAL HIGH (ref 70–99)
Potassium: 4.9 mmol/L (ref 3.5–5.1)
Sodium: 135 mmol/L (ref 135–145)

## 2021-03-18 LAB — RESP PANEL BY RT-PCR (FLU A&B, COVID) ARPGX2
Influenza A by PCR: NEGATIVE
Influenza B by PCR: NEGATIVE
SARS Coronavirus 2 by RT PCR: NEGATIVE

## 2021-03-18 LAB — TROPONIN I (HIGH SENSITIVITY): Troponin I (High Sensitivity): 17 ng/L (ref <18)

## 2021-03-18 MED ORDER — IPRATROPIUM-ALBUTEROL 0.5-2.5 (3) MG/3ML IN SOLN
3.0000 mL | Freq: Once | RESPIRATORY_TRACT | Status: AC
  Administered 2021-03-18: 3 mL via RESPIRATORY_TRACT
  Filled 2021-03-18: qty 3

## 2021-03-18 MED ORDER — ALBUTEROL SULFATE HFA 108 (90 BASE) MCG/ACT IN AERS
2.0000 | INHALATION_SPRAY | RESPIRATORY_TRACT | Status: DC | PRN
  Filled 2021-03-18: qty 6.7

## 2021-03-18 MED ORDER — SODIUM CHLORIDE 0.9 % IV SOLN
500.0000 mg | INTRAVENOUS | Status: DC
  Administered 2021-03-18: 500 mg via INTRAVENOUS
  Filled 2021-03-18: qty 500

## 2021-03-18 MED ORDER — DOXYCYCLINE HYCLATE 100 MG PO TABS
100.0000 mg | ORAL_TABLET | Freq: Once | ORAL | Status: DC
  Filled 2021-03-18: qty 1

## 2021-03-18 MED ORDER — FUROSEMIDE 10 MG/ML IJ SOLN
60.0000 mg | Freq: Once | INTRAMUSCULAR | Status: AC
  Administered 2021-03-18: 60 mg via INTRAVENOUS
  Filled 2021-03-18: qty 8

## 2021-03-18 MED ORDER — SODIUM CHLORIDE 0.9 % IV SOLN
1.0000 g | Freq: Once | INTRAVENOUS | Status: AC
  Administered 2021-03-18: 1 g via INTRAVENOUS
  Filled 2021-03-18: qty 10

## 2021-03-18 NOTE — H&P (Signed)
History and Physical    MIKYLAH ACKROYD [name]:[id number] DOB: October 28, 1940 DOA: 03/18/2021  PCP: [name], MD  Patient coming from: Home via urgent care  I have personally briefly reviewed patient's old medical records in [place]  Chief Complaint: Cough, shortness of breath  HPI: Kayla [name] is a 80 y.o. female with medical history significant for paroxysmal atrial fibrillation on Eliquis and amiodarone, HFpEF (last EF 60-65%, G2 DD by TTE 10/03/2020), COPD with chronic bronchitis, CKD stage IIIb, insulin-dependent T2DM, HTN, HLD, hypothyroidism, and OSA on CPAP who presented to the ED for evaluation of cough and shortness of breath.  Patient states 5 days ago she felt as if she was developing flulike symptoms or URI.  She developed headache, sore throat followed by frequent cough productive of gray sputum (she says typically yellow), and worsening shortness of breath compared to her baseline.  She has had chest and abdominal wall pain related to her frequent coughing.  She has had chills and diaphoresis but no obvious fever.  She has had increased erythema to her right leg with some blistering and sores which is new compared to her baseline bilateral lower extremity swelling.  She has felt nauseous but has not thrown up.  Reports good urine output with home diuretic and denies any dysuria.  She does not use supplemental oxygen at home.  ED Course:  Initial vitals showed BP 172/61, pulse 63, RR 16, temp 98.4 F, SPO2 92% on room air.  Labs show WBC 15.8, hemoglobin 10.8, platelets 388,000, sodium 135, potassium 4.9, bicarb 26, BUN 38, creatinine 1.40, serum glucose 279, high-sensitivity troponin 17.  BMP collected and pending.  SARS-CoV-2 and influenza PCR negative.  2 view chest x-ray shows central airway thickening suspicious for bronchitis.  Left-sided PPM seen in place.  Patient was given IV ceftriaxone, azithromycin, IV Lasix 60 mg, DuoNeb, albuterol inhaler.  The  hospitalist service was consulted to admit for further evaluation and management.  Review of Systems: All systems reviewed and are negative except as documented in history of present illness above.   Past Medical History:  Diagnosis Date   A-fib [facility])    Allergy    Anxiety    Arthritis    CAD (coronary artery disease)    Mild per remote cath in 2006, /    Nuclear, January, 2013, no significant abnormality,   Carotid artery disease ([place])    Doppler, January, 2013, 0 08% RIC A., 65-78% LICA, stable   Cataract    CHF (congestive heart failure) ([place])    Complication of anesthesia    wakes up "mean"   COPD 02/16/2007   Intolerant of Spiriva, tudorza Intolerant of Trelegy    COPD (chronic obstructive pulmonary disease) ([place])    Coronary atherosclerosis 11/24/2008   Catheterization 2006, nonobstructive coronary disease   //   nuclear 2013, low risk     Diabetes mellitus, type 2 ([place])    Diverticula of colon    DIVERTICULOSIS OF COLON 03/23/2007   Qualifier: Diagnosis of  By: Halford Chessman MD, Vineet     Dizziness    occasional   Dizzy spells 05/04/2011   DM (diabetes mellitus) ([place]) 03/23/2007   Qualifier: Diagnosis of  By: Halford Chessman MD, Vineet     Dyspnea    On exertion   Ejection fraction    Essential hypertension 02/16/2007   Qualifier: Diagnosis of  By: Rosana Hoes CMA, Tammy     G E R D 03/23/2007   Qualifier: Diagnosis of  By: Halford Chessman MD, Vineet     Gall bladder disease 04/28/2016   Gastritis and gastroduodenitis    GERD (gastroesophageal reflux disease)    Goiter    hx of   Headache(784.0)    migraine   Hyperlipidemia    Hypertension    Hypothyroidism    HYPOTHYROIDISM, POSTSURGICAL 06/04/2008   Qualifier: Diagnosis of  By: Loanne Drilling MD, Sean A    Left ventricular hypertrophy    Leg edema    occasional swelling   Leg swelling 12/09/2018   Myofascial pain syndrome, cervical 01/06/2018   Obesity    OBESITY 11/24/2008   Qualifier: Diagnosis of  By: Sidney Ace     OBSTRUCTIVE SLEEP APNEA  02/16/2007   Auto CPAP 09/03/13 to 10/02/13 >> used on 30 of 30 nights with average 6 hrs and 3 min.  Average AHI is 3.1 with median CPAP 5 cm H2O and 95 th percentile CPAP 6 cm H2O.     Occipital neuralgia of right side 01/06/2018   OSA (obstructive sleep apnea)    cpap setting of 12   Pain in joint of right hip 01/21/2018   Palpitations 01/21/2011   48 hour Holter, 2015, scattered PACs and PVCs.    Pneumonia    PONV (postoperative nausea and vomiting)    severe after every surgery   Pulmonary hypertension, secondary    RHINITIS 07/21/2010        RUQ abdominal pain    S/P left TKA 07/27/2011   Sinus node dysfunction ([place]) 11/08/2015   Sleep apnea    Spinal stenosis of cervical region 11/07/2014   Urinary frequency 12/09/2018   UTI (urinary tract infection) 08/15/2019   per pt report   VENTRICULAR HYPERTROPHY, LEFT 11/24/2008   Qualifier: Diagnosis of  By: Sidney Ace      Past Surgical History:  Procedure Laterality Date   ABDOMINAL HYSTERECTOMY     with appendectomy and colon polypectomy   AMPUTATION TOE Left 12/15/2019   Procedure: AMPUTATION  LEFT GREAT TOE;  Surgeon: Evelina Bucy, DPM;  Location: [facility];  Service: Podiatry;  Laterality: Left;   ANTERIOR CERVICAL DECOMP/DISCECTOMY FUSION N/A 11/07/2014   Procedure: Anterior Cervical diskectomy and fusion Cervical four-five, Cervical five-six, Cervical six-seven;  Surgeon: Kary Kos, MD;  Location: [facility];  Service: Neurosurgery;  Laterality: N/A;   APPENDECTOMY     ARTERY BIOPSY Right 02/16/2017   Procedure: BIOPSY TEMPORAL ARTERY;  Surgeon: Katha Cabal, MD;  Location: [facility] ORS;  Service: Vascular;  Laterality: Right;   ARTERY BIOPSY Left 04/16/2020   Procedure: BIOPSY TEMPORAL ARTERY;  Surgeon: Katha Cabal, MD;  Location: [facility] ORS;  Service: Vascular;  Laterality: Left;  LEFT TEMPLE   BACK SURGERY     CARDIAC CATHETERIZATION     CATARACT EXTRACTION W/ INTRAOCULAR LENS  IMPLANT, BILATERAL Bilateral     CHOLECYSTECTOMY N/A 04/29/2016   Procedure: LAPAROSCOPIC CHOLECYSTECTOMY WITH INTRAOPERATIVE CHOLANGIOGRAM;  Surgeon: Alphonsa Overall, MD;  Location: [facility];  Service: General;  Laterality: N/A;   EP IMPLANTABLE DEVICE N/A 11/08/2015   MRI COMPATABLE -- Procedure: Pacemaker Implant;  Surgeon: Deboraha Sprang, MD;  Location: [facility] CV LAB;  Service: Cardiovascular;  Laterality: N/A;   INSERT / REPLACE / REMOVE PACEMAKER  2017   Dr. Jens Som [facility])   JOINT REPLACEMENT     LUMBAR [place] SURGERY  1990's   x 2   THYROIDECTOMY  2011   TOTAL KNEE ARTHROPLASTY  07/27/2011   Procedure: TOTAL KNEE ARTHROPLASTY;  Surgeon: Mauri Pole, MD;  Location: [facility];  Service: Orthopedics;  Laterality: Left;   UPPER ESOPHAGEAL ENDOSCOPIC ULTRASOUND (EUS) N/A 05/14/2016   Procedure: UPPER ESOPHAGEAL ENDOSCOPIC ULTRASOUND (EUS);  Surgeon: Milus Banister, MD;  Location: [facility];  Service: Endoscopy;  Laterality: N/A;  radial only-will be done mod if needed    Social History:  reports that she quit smoking about 23 years ago. Her smoking use included cigarettes. She started smoking about 56 years ago. She has a 66.00 pack-year smoking history. She has never used smokeless tobacco. She reports that she does not drink alcohol and does not use drugs.  Allergies  Allergen Reactions   Dulaglutide Nausea Only, Other (See Comments) and Cough    Made the patient sick to her stomach (only) several days after using it Trulicity   Oxycodone Itching    Pt still takes this med even thought it causes itching   Trelegy Ellipta [Fluticasone-Umeclidin-Vilant] Other (See Comments)    unknown    Family History  Problem Relation Age of Onset   Heart Problems Mother    Heart defect Mother        valve problems   Heart Problems Father    Heart disease Brother    Lung cancer Daughter        non small cell    Diabetes Maternal Aunt    Diabetes Maternal Uncle    Throat cancer Brother    Colon cancer Neg Hx    Colon  polyps Neg Hx    Esophageal cancer Neg Hx    Rectal cancer Neg Hx    Stomach cancer Neg Hx      Prior to Admission medications   Medication Sig Start Date End Date Taking? Authorizing Provider  acetaminophen (TYLENOL) 500 MG tablet Take 1,000 mg by mouth every 6 (six) hours as needed for moderate pain (for headaches).    Yes [provider]  albuterol (PROVENTIL) (2.5 MG/3ML) 0.083% nebulizer solution Take 3 mLs (2.5 mg total) by nebulization every 2 (two) hours as needed for wheezing or shortness of breath. 05/15/20  Yes Chesley Mires, MD  albuterol (VENTOLIN HFA) 108 (90 Base) MCG/ACT inhaler Inhale 2 puffs into the lungs every 6 (six) hours as needed for wheezing or shortness of breath. 01/26/21  Yes Tanda Rockers, MD  allopurinol (ZYLOPRIM) 100 MG tablet Take 100 mg by mouth 2 (two) times daily. 04/16/19  Yes [provider]  amiodarone (PACERONE) 200 MG tablet Take 1 tablet (200 mg total) by mouth daily. 07/09/20  Yes Deboraha Sprang, MD  amLODipine (NORVASC) 5 MG tablet Take 1 tablet (5 mg total) by mouth daily. 03/10/21  Yes Jerline Pain, MD  ascorbic acid (VITAMIN C) 500 MG tablet Take 500 mg by mouth every morning.   Yes [provider]  atorvastatin (LIPITOR) 40 MG tablet TAKE 1 TABLET EVERY DAY Patient taking differently: Take 40 mg by mouth daily. 10/23/20  Yes Jerline Pain, MD  benzonatate (TESSALON) 200 MG capsule Take 200 mg by mouth 3 (three) times daily as needed for cough.   Yes [provider]  BREZTRI AEROSPHERE 160-9-4.8 MCG/ACT AERO INHALE 2 PUFFS INTO THE LUNGS IN THE MORNING AND AT BEDTIME. 01/15/21  Yes Chesley Mires, MD  Carboxymethylcellulose Sodium (ARTIFICIAL TEARS OP) Place 1 drop into both eyes daily as needed.   Yes [provider]  Dextromethorphan-guaiFENesin (TUSSIN DM MAX SUGAR-FREE PO) Take 5 mLs by mouth every 4 (four) hours as needed (  for coughing).   Yes [provider]  ELIQUIS 5 MG TABS tablet TAKE 1  TABLET TWICE DAILY Patient taking differently: Take 5 mg by mouth 2 (two) times daily. 09/09/20  Yes Deboraha Sprang, MD  ferrous sulfate 325 (65 FE) MG tablet Take 1 tablet (325 mg total) by mouth daily with breakfast. 01/26/20  Yes Deboraha Sprang, MD  fluticasone [facility]) 50 MCG/ACT nasal spray USE 2 SPRAYS NASALLY DAILY Patient taking differently: Place 2 sprays into both nostrils daily as needed for allergies or rhinitis. 02/26/17  Yes Collene Gobble, MD  gabapentin (NEURONTIN) 800 MG tablet Take 800 mg by mouth 3 (three) times daily. 10/29/20  Yes [provider]  HYDROcodone-acetaminophen (NORCO/VICODIN) 5-325 MG tablet Take 1 tablet by mouth every 4 (four) hours as needed for moderate pain. 01/28/21  Yes [provider]  insulin aspart (NOVOLOG) 100 UNIT/ML FlexPen Inject 5-15 Units into the skin 3 (three) times daily with meals. Per sliding scale (and an additional 1 unit for every 50 points for a BGL >130) Sliding scale   Yes [provider]  insulin glargine (LANTUS) 100 UNIT/ML injection Inject 110 Units into the skin in the morning and at bedtime.   Yes [provider]  levothyroxine (SYNTHROID) 112 MCG tablet Take 224 mcg by mouth daily. 08/02/20  Yes [provider]  losartan (COZAAR) 50 MG tablet Take 50 mg by mouth daily. 02/14/21  Yes [provider]  metoprolol succinate (TOPROL-XL) 100 MG 24 hr tablet TAKE 1 TABLET EVERY DAY WITH OR IMMEDIATELY AFTER A MEAL Patient taking differently: Take 100 mg by mouth daily. 11/14/20  Yes Deboraha Sprang, MD  naloxone [facility]) nasal spray 4 mg/0.1 mL Place 4 mg into the nose once as needed for opioid reversal. 12/04/20  Yes [provider]  nitroGLYCERIN (NITROSTAT) 0.4 MG SL tablet Place 1 tablet (0.4 mg total) under the tongue every 5 (five) minutes as needed for chest pain. 04/13/19  Yes Jerline Pain, MD  PRESCRIPTION MEDICATION 1 Dose by Other route at bedtime. CPAP: At bedtime and  "as needed for shortness of breath"   Yes [provider]  torsemide (DEMADEX) 20 MG tablet Take 80 mg by mouth daily.   Yes [provider]  vitamin B-12 (CYANOCOBALAMIN) 500 MCG tablet Take 500 mcg by mouth daily.   Yes [provider]  Vitamin D3 (VITAMIN D) 25 MCG tablet Take 1,000 Units by mouth every morning.   Yes [provider]  Alcohol Swabs (B-D SINGLE USE SWABS REGULAR) PADS  11/24/19   [provider]  Blood Glucose Calibration (TRUE METRIX LEVEL 1) Low SOLN  11/27/19   [provider]  torsemide 40 MG TABS Take 80 mg by mouth daily. Patient not taking: No sig reported 02/01/21   Almyra Deforest, [place]  TRUE METRIX BLOOD GLUCOSE TEST test strip  09/18/17   [provider]  TRUEplus Lancets 28G [place]  11/24/19   [provider]    Physical Exam: Vitals:   03/18/21 1510 03/18/21 1951 03/18/21 2120 03/18/21 2200  BP: (!) 177/68 (!) 154/75 (!) 168/63 (!) 182/71  Pulse: 68 69 67 62  Resp:  18 18 18   Temp:      TempSrc:      SpO2:  90% 91% 100%   Constitutional: Obese elderly woman resting in bed, appears tired but in NAD, calm, comfortable Eyes: PERRL, lids and conjunctivae normal ENMT: Mucous membranes are dry. Posterior pharynx clear of  any exudate or lesions.Normal dentition.  Neck: normal, supple, no masses. Respiratory: Distant breath sounds with expiratory wheezing throughout.  Slightly increased respiratory effort. No accessory muscle use.  Frequent cough. Cardiovascular: Regular rate and rhythm, no murmurs / rubs / gallops.  Trace bilateral lower extremity edema. 2+ pedal pulses. Abdomen: no tenderness, no masses palpated. No hepatosplenomegaly. Bowel sounds positive.  Musculoskeletal: no clubbing / cyanosis. No joint deformity upper and lower extremities. Good ROM, no contractures. Normal muscle tone.  Skin: Erythema right lower extremity from foot halfway up the shin with couple scattered superficial sores  without active purulent drainage. Neurologic: CN 2-12 grossly intact. Sensation intact. Strength 5/5 in all 4.  Psychiatric: Normal judgment and insight. Alert and oriented x 3. Normal mood.      Labs on Admission: I have personally reviewed following labs and imaging studies  CBC: Recent Labs  Lab 03/18/21 1333  WBC 15.8*  NEUTROABS 12.8*  HGB 10.8*  HCT 34.9*  MCV 94.6  PLT 875   Basic Metabolic Panel: Recent Labs  Lab 03/18/21 1333  NA 135  K 4.9  CL 98  CO2 26  GLUCOSE 279*  BUN 38*  CREATININE 1.48*  CALCIUM 9.6   GFR: CrCl cannot be calculated (Unknown ideal weight.). Liver Function Tests: No results for input(s): AST, ALT, ALKPHOS, BILITOT, PROT, ALBUMIN in the last 168 hours. No results for input(s): LIPASE, AMYLASE in the last 168 hours. No results for input(s): AMMONIA in the last 168 hours. Coagulation Profile: No results for input(s): INR, PROTIME in the last 168 hours. Cardiac Enzymes: No results for input(s): CKTOTAL, CKMB, CKMBINDEX, TROPONINI in the last 168 hours. BNP (last 3 results) Recent Labs    09/24/20 1128  PROBNP 562   HbA1C: No results for input(s): HGBA1C in the last 72 hours. CBG: No results for input(s): GLUCAP in the last 168 hours. Lipid Profile: No results for input(s): CHOL, HDL, LDLCALC, TRIG, CHOLHDL, LDLDIRECT in the last 72 hours. Thyroid Function Tests: No results for input(s): TSH, T4TOTAL, FREET4, T3FREE, THYROIDAB in the last 72 hours. Anemia Panel: No results for input(s): VITAMINB12, FOLATE, FERRITIN, TIBC, IRON, RETICCTPCT in the last 72 hours. Urine analysis:    Component Value Date/Time   COLORURINE YELLOW 08/15/2019 2257   APPEARANCEUR HAZY (A) 08/15/2019 2257   LABSPEC 1.014 08/15/2019 2257   PHURINE 5.0 08/15/2019 2257   GLUCOSEU NEGATIVE 08/15/2019 2257   GLUCOSEU NEGATIVE 12/09/2018 0944   HGBUR NEGATIVE 08/15/2019 2257   BILIRUBINUR NEGATIVE 08/15/2019 [place] 08/15/2019 2257    PROTEINUR 100 (A) 08/15/2019 2257   UROBILINOGEN 0.2 12/09/2018 0944   NITRITE NEGATIVE 08/15/2019 2257   LEUKOCYTESUR MODERATE (A) 08/15/2019 2257    Radiological Exams on Admission: DG Chest 2 View  Result Date: 03/18/2021 CLINICAL DATA:  Shortness of breath. Cough and chest pain for 4 days. EXAM: CHEST - 2 VIEW COMPARISON:  Radiographs 01/31/2021 and CT 11/14/2020. FINDINGS: Patient is mildly rotated to the right on the AP view. The left subclavian pacemaker leads appear unchanged at the level of the right atrium and right ventricle. The heart size and mediastinal contours are stable. There is central airway thickening without confluent airspace opacity, pleural effusion or pneumothorax. Mild degenerative changes are present throughout the spine. Patient is status post lower cervical fusion and thyroidectomy. IMPRESSION: Central airway thickening suspicious for bronchitis. Otherwise stable chest. Electronically Signed   By: Richardean Sale M.D.   On: 03/18/2021 14:40    EKG: Personally reviewed.  Limited due to motion artifact, atrial paced rhythm.  Assessment/Plan Principal Problem:   COPD with acute exacerbation (HCC) Active Problems:   HYPOTHYROIDISM, POSTSURGICAL   Insulin dependent type 2 diabetes mellitus ([place])   Hyperlipidemia associated with type 2 diabetes mellitus (HCC)   OBSTRUCTIVE SLEEP APNEA   Hypertension associated with diabetes ([place])   Acute respiratory failure with hypoxia (HCC)   Paroxysmal A-fib (HCC)   Cellulitis of right lower extremity   CKD (chronic kidney disease), stage III (HCC)   Kayla [name] is a 80 y.o. female with medical history significant for paroxysmal atrial fibrillation on Eliquis and amiodarone, HFpEF (last EF 60-65%, G2 DD by TTE 10/03/2020), COPD with chronic bronchitis, CKD stage IIIb, insulin-dependent T2DM, HTN, HLD, hypothyroidism, and OSA on CPAP who is admitted with acute hypoxic respiratory failure due to COPD exacerbation.  Acute  respiratory failure with hypoxia due to acute exacerbation of COPD with chronic bronchitis: Presenting with worsening shortness of breath, cough productive of increased purulent sputum, wheezing compared to baseline.  SPO2 dropped to 86% while on room air, improved on 2 L supplemental O2 via [place].  Patient does not use supplemental oxygen at home. -Start IV Solu-Medrol 40 mg twice daily -Start scheduled DuoNebs with albuterol as needed -Continue oral azithromycin -Continue supplemental oxygen as needed -Continue home Breqztri  Cellulitis of the right lower extremity: New erythema RLE with few small superficial nonpurulent wounds.  Patient does have leukocytosis with WBC 15.8. -Continue IV Ancef  HFpEF: Currently stable, continue home torsemide.  Paroxysmal atrial fibrillation History of sinus bradycardia s/p PPM: Controlled rate on admission.  Continue home amiodarone, Toprol-XL, Eliquis.  CKD stage IIIb: Stable, renal function actually improved from recent baseline.  Insulin-dependent type 2 diabetes: Continue reduced insulin Semglee 80 units twice daily.  Add sensitive SSI.  Hypertension: Continue home amlodipine, losartan, Toprol-XL, torsemide.  Hypothyroidism: Synthroid.  Hyperlipidemia: Continue atorvastatin.  Chronic pain: Continue Norco as needed with hold parameters, continue gabapentin.  OSA: Continue CPAP nightly.  DVT prophylaxis: Eliquis Code Status: DNR, confirmed with patient on admission Family Communication: Discussed with patient, she has discussed with family Disposition Plan: From home, dispo pending clinical progress Consults called: None Level of care: Telemetry Admission status:  Status is: Observation  The patient remains OBS appropriate and will d/c before 2 midnights.  Zada Finders MD [facility]  If 7PM-7AM, please contact night-coverage [email or web address]  03/19/2021, 12:55 AM

## 2021-03-18 NOTE — ED Provider Notes (Signed)
[facility]    CSN: [id number] Arrival date & time: 03/18/21  0914      History   Chief Complaint Chief Complaint  Patient presents with   Cough    HPI Kayla [name] is a 80 y.o. female.   Patient presents today with a 5-day history of worsening shortness of breath and cough.  Reports cough is productive with a change in sputum.  She reports associated fatigue, malaise, chest tightness, nausea, decreased appetite.  Denies any fever.  She is up-to-date on COVID-19 vaccine and has had flu shot.  Denies any known sick contacts.  She has tried over-the-counter medication without improvement of symptoms.  She has not been monitoring her oxygen at home but does report that she has been increasingly more short of breath.  She denies any recent antibiotic use.  She has been using inhalers including nebulizer treatment as prescribed; last nebulizer was earlier today and is providing no relief of symptoms.  She does have a history of heart failure and is taking Lasix as prescribed.  Denies any sudden weight gain.  She is having difficulty with daily activities with even getting dressed was exhausting today.   Past Medical History:  Diagnosis Date   A-fib [facility])    Allergy    Anxiety    Arthritis    CAD (coronary artery disease)    Mild per remote cath in 2006, /    Nuclear, January, 2013, no significant abnormality,   Carotid artery disease ([place])    Doppler, January, 2013, 0 40% RIC A., 08-67% LICA, stable   Cataract    CHF (congestive heart failure) ([place])    Complication of anesthesia    wakes up "mean"   COPD 02/16/2007   Intolerant of Spiriva, tudorza Intolerant of Trelegy    COPD (chronic obstructive pulmonary disease) ([place])    Coronary atherosclerosis 11/24/2008   Catheterization 2006, nonobstructive coronary disease   //   nuclear 2013, low risk     Diabetes mellitus, type 2 ([place])    Diverticula of colon    DIVERTICULOSIS OF COLON 03/23/2007   Qualifier:  Diagnosis of  By: Halford Chessman MD, Vine[place]     Dizziness    occasional   Dizzy spells 05/04/2011   DM (diabetes mellitus) ([place]) 03/23/2007   Qualifier: Diagnosis of  By: Halford Chessman MD, Vineet     Dyspnea    On exertion   Ejection fraction    Essential hypertension 02/16/2007   Qualifier: Diagnosis of  By: Rosana Hoes CMA, Tammy     G E R D 03/23/2007   Qualifier: Diagnosis of  By: Halford Chessman MD, Vine[place]     Gall bladder disease 04/28/2016   Gastritis and gastroduodenitis    GERD (gastroesophageal reflux disease)    Goiter    hx of   Headache(784.0)    migraine   Hyperlipidemia    Hypertension    Hypothyroidism    HYPOTHYROIDISM, POSTSURGICAL 06/04/2008   Qualifier: Diagnosis of  By: Loanne Drilling MD, Sean A    Left ventricular hypertrophy    Leg edema    occasional swelling   Leg swelling 12/09/2018   Myofascial pain syndrome, cervical 01/06/2018   Obesity    OBESITY 11/24/2008   Qualifier: Diagnosis of  By: Sidney Ace     OBSTRUCTIVE SLEEP APNEA 02/16/2007   Auto CPAP 09/03/13 to 10/02/13 >> used on 30 of 30 nights with average 6 hrs and 3 min.  Average AHI is 3.1 with median CPAP 5 cm  H2O and 95 th percentile CPAP 6 cm H2O.     Occipital neuralgia of right side 01/06/2018   OSA (obstructive sleep apnea)    cpap setting of 12   Pain in joint of right hip 01/21/2018   Palpitations 01/21/2011   48 hour Holter, 2015, scattered PACs and PVCs.    Pneumonia    PONV (postoperative nausea and vomiting)    severe after every surgery   Pulmonary hypertension, secondary    RHINITIS 07/21/2010        RUQ abdominal pain    S/P left TKA 07/27/2011   Sinus node dysfunction ([place]) 11/08/2015   Sleep apnea    Spinal stenosis of cervical region 11/07/2014   Urinary frequency 12/09/2018   UTI (urinary tract infection) 08/15/2019   per pt report   VENTRICULAR HYPERTROPHY, LEFT 11/24/2008   Qualifier: Diagnosis of  By: Sidney Ace      Patient Active Problem List   Diagnosis Date Noted   Diastolic heart failure ([place])  10/16/2020   Acute on chronic diastolic (congestive) heart failure ([place]) 09/27/2020   Polymyalgia rheumatica ([place]) 04/15/2020   Temporal arteritis ([place]) 04/15/2020   Pacemaker - MDT 01/24/2020   Orthostatic lightheadedness 01/24/2020   Chronic osteomyelitis involving left ankle and foot (HCC)    Paroxysmal A-fib ([place]) 10/30/2019   Paroxysmal atrial fibrillation ([place]) 10/25/2019   Secondary hypercoagulable state ([place]) 10/25/2019   Numbness and tingling of right leg 10/19/2019   Lumbar radiculopathy 10/19/2019   Acute exacerbation of CHF (congestive heart failure) ([place]) 08/13/2019   COPD with acute exacerbation ([place]) 08/13/2019   Acute respiratory failure ([place]) 08/13/2019   Chest pain 08/13/2019   Leukocytosis 08/13/2019   Leg swelling 12/09/2018   Urinary frequency 12/09/2018   Pain in joint of right hip 01/21/2018   Myofascial pain syndrome, cervical 01/06/2018   Occipital neuralgia of right side 01/06/2018   RUQ abdominal pain    Gastritis and gastroduodenitis    Bile duct abnormality    Cholecystitis 04/30/2016   Gall bladder disease 04/28/2016   Sinus node dysfunction ([place]) 11/08/2015   Ejection fraction    Spinal stenosis of cervical region 11/07/2014   Preoperative clearance 10/09/2014   Carotid artery disease ([place])    S/P left TKA 07/27/2011   Dizzy spells 05/04/2011   Palpitations 01/21/2011   RHINITIS 07/21/2010   Hyperlipidemia 11/24/2008   OBESITY 11/24/2008   Coronary atherosclerosis 11/24/2008   VENTRICULAR HYPERTROPHY, LEFT 11/24/2008   HYPOTHYROIDISM, POSTSURGICAL 06/04/2008   Headache 06/04/2008   DM (diabetes mellitus) ([place]) 03/23/2007   G E R D 03/23/2007   DIVERTICULOSIS OF COLON 03/23/2007   GENERALIZED OSTEOARTHROSIS UNSPECIFIED SITE 03/23/2007   OBSTRUCTIVE SLEEP APNEA 02/16/2007   Essential hypertension 02/16/2007   COPD (chronic obstructive pulmonary disease) ([place]) 02/16/2007    Past Surgical History:  Procedure Laterality Date   ABDOMINAL  HYSTERECTOMY     with appendectomy and colon polypectomy   AMPUTATION TOE Left 12/15/2019   Procedure: AMPUTATION  LEFT GREAT TOE;  Surgeon: Evelina Bucy, DPM;  Location: [facility];  Service: Podiatry;  Laterality: Left;   ANTERIOR CERVICAL DECOMP/DISCECTOMY FUSION N/A 11/07/2014   Procedure: Anterior Cervical diskectomy and fusion Cervical four-five, Cervical five-six, Cervical six-seven;  Surgeon: Kary Kos, MD;  Location: [facility];  Service: Neurosurgery;  Laterality: N/A;   APPENDECTOMY     ARTERY BIOPSY Right 02/16/2017   Procedure: BIOPSY TEMPORAL ARTERY;  Surgeon: Katha Cabal, MD;  Location: [facility] OR[facility];  Service: Vascular;  Laterality: Right;   ARTERY BIOPSY Left 04/16/2020   Procedure: BIOPSY TEMPORAL ARTERY;  Surgeon: Katha Cabal, MD;  Location: [facility] OR[facility];  Service: Vascular;  Laterality: Left;  LEFT TEMPLE   BACK SURGERY     CARDIAC CATHETERIZATION     CATARACT EXTRACTION W/ INTRAOCULAR LENS  IMPLANT, BILATERAL Bilateral    CHOLECYSTECTOMY N/A 04/29/2016   Procedure: LAPAROSCOPIC CHOLECYSTECTOMY WITH INTRAOPERATIVE CHOLANGIOGRAM;  Surgeon: Alphonsa Overall, MD;  Location: [facility];  Service: General;  Laterality: N/A;   EP IMPLANTABLE DEVICE N/A 11/08/2015   MRI COMPATABLE -- Procedure: Pacemaker Implant;  Surgeon: Deboraha Sprang, MD;  Location: [facility];  Service: Cardiovascular;  Laterality: N/A;   INSERT / REPLACE / REMOVE PACEMAKER  2017   Dr. Jens Som [facility])   JOINT REPLACEMENT     LUMBAR [place] SURGERY  1990's   x 2   THYROIDECTOMY  2011   TOTAL KNEE ARTHROPLASTY  07/27/2011   Procedure: TOTAL KNEE ARTHROPLASTY;  Surgeon: Mauri Pole, MD;  Location: [facility];  Service: Orthopedics;  Laterality: Left;   UPPER ESOPHAGEAL ENDOSCOPIC ULTRASOUND (EUS) N/A 05/14/2016   Procedure: UPPER ESOPHAGEAL ENDOSCOPIC ULTRASOUND (EUS);  Surgeon: Milus Banister, MD;  Location: [facility];  Service: Endoscopy;  Laterality: N/A;  radial only-will be done mod if needed     OB History   No obstetric history on file.      Home Medications    Prior to Admission medications   Medication Sig Start Date End Date Taking? Authorizing Provider  acetaminophen (TYLENOL) 500 MG tablet Take 1,000 mg by mouth every 6 (six) hours as needed for moderate pain (for headaches).     [provider]  albuterol (PROVENTIL) (2.5 MG/3ML) 0.083% nebulizer solution Take 3 mLs (2.5 mg total) by nebulization every 2 (two) hours as needed for wheezing or shortness of breath. 05/15/20   Chesley Mires, MD  albuterol (VENTOLIN HFA) 108 (90 Base) MCG/ACT inhaler Inhale 2 puffs into the lungs every 6 (six) hours as needed for wheezing or shortness of breath. 01/26/21   Tanda Rockers, MD  Alcohol Swabs (B-D SINGLE USE SWABS REGULAR) PADS  11/24/19   [provider]  allopurinol (ZYLOPRIM) 100 MG tablet Take 100 mg by mouth 2 (two) times daily. 04/16/19   [provider]  amiodarone (PACERONE) 200 MG tablet Take 1 tablet (200 mg total) by mouth daily. 07/09/20   Deboraha Sprang, MD  amLODipine (NORVASC) 5 MG tablet Take 1 tablet (5 mg total) by mouth daily. 03/10/21   Jerline Pain, MD  ascorbic acid (VITAMIN C) 500 MG tablet Take 500 mg by mouth every morning.    [provider]  atorvastatin (LIPITOR) 40 MG tablet TAKE 1 TABLET EVERY DAY Patient taking differently: Take 40 mg by mouth daily. 10/23/20   Jerline Pain, MD  Blood Glucose Calibration (TRUE METRIX LEVEL 1) Low SOLN  11/27/19   [provider]  BREZTRI AEROSPHERE 160-9-4.8 MCG/ACT AERO INHALE 2 PUFFS INTO THE LUNGS IN THE MORNING AND AT BEDTIME. 01/15/21   Chesley Mires, MD  Dextromethorphan-guaiFENesin (TUSSIN DM MAX SUGAR-FREE PO) Take 5 mLs by mouth every 4 (four) hours as needed (for coughing). Patient not taking: Reported on 02/10/2021    [provider]  ELIQUIS 5 MG TABS tablet TAKE 1 TABLET TWICE DAILY Patient taking differently: Take 5 mg by mouth 2 (two) times daily.  09/09/20   Deboraha Sprang, MD  ferrous sulfate 325 (65 FE) MG  tablet Take 1 tablet (325 mg total) by mouth daily with breakfast. 01/26/20   Deboraha Sprang, MD  fluticasone [facility]) 50 MCG/ACT nasal spray USE 2 SPRAYS NASALLY DAILY Patient taking differently: Place 2 sprays into both nostrils daily as needed for allergies or rhinitis. 02/26/17   Collene Gobble, MD  gabapentin (NEURONTIN) 800 MG tablet Take 800 mg by mouth in the morning, at noon, in the evening, and at bedtime. 10/29/20   [provider]  HYDROcodone-acetaminophen (NORCO/VICODIN) 5-325 MG tablet Take 1 tablet by mouth every 4 (four) hours as needed for moderate pain. 01/28/21   [provider]  insulin aspart (NOVOLOG) 100 UNIT/ML FlexPen Inject 30-70 Units into the skin 3 (three) times daily with meals. Per sliding scale (and an additional 1 unit for every 50 points for a BGL >130) Sliding scale    [provider]  insulin glargine (LANTUS) 100 UNIT/ML injection Inject 100 Units into the skin in the morning and at bedtime.    [provider]  levothyroxine (SYNTHROID) 112 MCG tablet Take 112 mcg by mouth in the morning and at bedtime. 08/02/20   [provider]  metoprolol succinate (TOPROL-XL) 100 MG 24 hr tablet TAKE 1 TABLET EVERY DAY WITH OR IMMEDIATELY AFTER A MEAL Patient taking differently: Take 100 mg by mouth daily. 11/14/20   Deboraha Sprang, MD  naloxone [facility]) nasal spray 4 mg/0.1 mL Place 4 mg into the nose as needed for opioid reversal. 12/04/20   [provider]  nitroGLYCERIN (NITROSTAT) 0.4 MG SL tablet Place 1 tablet (0.4 mg total) under the tongue every 5 (five) minutes as needed for chest pain. 04/13/19   Jerline Pain, MD  PRESCRIPTION MEDICATION CPAP: At bedtime and "as needed for shortness of breath"    [provider]  torsemide 40 MG TABS Take 80 mg by mouth daily. 02/01/21   Almyra Deforest, PA  TRUE METRIX BLOOD GLUCOSE TEST test strip  09/18/17    [provider]  TRUEplus Lancets 28G [place]  11/24/19   [provider]  vitamin B-12 (CYANOCOBALAMIN) 500 MCG tablet Take 500 mcg by mouth daily.    [provider]  Vitamin D3 (VITAMIN D) 25 MCG tablet Take 1,000 Units by mouth every morning.    [provider]    Family History Family History  Problem Relation Age of Onset   Heart Problems Mother    Heart defect Mother        valve problems   Heart Problems Father    Heart disease Brother    Lung cancer Daughter        non small cell    Diabetes Maternal Aunt    Diabetes Maternal Uncle    Throat cancer Brother    Colon cancer Neg Hx    Colon polyps Neg Hx    Esophageal cancer Neg Hx    Rectal cancer Neg Hx    Stomach cancer Neg Hx     Social History Social History   Tobacco Use   Smoking status: Former    Packs/day: 2.00    Years: 33.00    Pack years: 66.00    Types: Cigarettes    Start date: 1966    Quit date: 05/04/1997    Years since quitting: 23.8   Smokeless tobacco: Never  Vaping Use   Vaping Use: Never used  Substance Use Topics   Alcohol use: No   Drug use: No     Allergies  Dulaglutide and Oxycodone   Review of Systems Review of Systems  Constitutional:  Positive for activity change, appetite change and fatigue. Negative for fever.  Respiratory:  Positive for cough, chest tightness and shortness of breath.   Cardiovascular:  Negative for chest pain.  Gastrointestinal:  Positive for nausea. Negative for abdominal pain, diarrhea and vomiting.  Neurological:  Positive for headaches. Negative for dizziness and light-headedness.    Physical Exam Triage Vital Signs ED Triage Vitals [03/18/21 1036]  Enc Vitals Group     BP (!) 144/70     Pulse Rate 74     Resp 18     Temp 98 F (36.7 C)     Temp Source Oral     SpO2 91 %     Weight      Height      Head Circumference      Peak Flow      Pain Score 0     Pain Loc      Pain Edu?      Excl. in [place]?    No  data found.  Updated Vital Signs BP (!) 144/70 (BP Location: Left Arm)   Pulse 74   Temp 98 F (36.7 C) (Oral)   Resp 18   SpO2 91%   Visual Acuity Right Eye Distance:   Left Eye Distance:   Bilateral Distance:    Right Eye Near:   Left Eye Near:    Bilateral Near:     Physical Exam Vitals reviewed.  Constitutional:      General: She is awake. She is not in acute distress.    Appearance: Normal appearance. She is well-developed. She is ill-appearing.     Comments: Very pleasant female appears older than stated age in no acute distress  HENT:     Head: Normocephalic and atraumatic.     Mouth/Throat:     Mouth: Mucous membranes are moist.  Cardiovascular:     Rate and Rhythm: Normal rate and regular rhythm.     Heart sounds: Normal heart sounds, S1 normal and S2 normal. No murmur heard. Pulmonary:     Effort: Pulmonary effort is normal.     Breath sounds: Examination of the left-lower field reveals rales. Wheezing, rhonchi and rales present.  Abdominal:     General: Bowel sounds are normal.     Palpations: Abdomen is soft.     Tenderness: There is no abdominal tenderness. There is no right CVA tenderness, left CVA tenderness, guarding or rebound.  Psychiatric:        Behavior: Behavior is cooperative.     UC Treatments / Results  Labs (all labs ordered are listed, but only abnormal results are displayed) Labs Reviewed - No data to display  EKG   Radiology No results found.  Procedures Procedures (including critical care time)  Medications Ordered in UC Medications - No data to display  Initial Impression / Assessment and Plan / UC Course  I have reviewed the triage vital signs and the nursing notes.  Pertinent labs & imaging results that were available during my care of the patient were reviewed by me and considered in my medical decision making (see chart for details).     Patient's oxygen was between 87% and 90% throughout interview.  Given  worsening shortness of breath despite regular use of breathing treatments and concern for pneumonia she was instructed to go to the ER.  Patient was instructed to go directly to ER for further evaluation  and management.  Offered EMS transport which she declined.  She will have family member bring her directly to ER for further evaluation and management.  Final Clinical Impressions(s) / UC Diagnoses   Final diagnoses:  SOB (shortness of breath)  COPD exacerbation (HCC)  Hypoxia     Discharge Instructions      Go to the emergency room for further evaluation and management.     ED Prescriptions   None    PDMP not reviewed this encounter.   Terrilee Croak, PA-C 03/18/21 1122

## 2021-03-18 NOTE — ED Provider Notes (Signed)
Emergency Department Provider Note   I have reviewed the triage vital signs and the nursing notes.   HISTORY  Chief Complaint Cough and Chest Pain   HPI Kayla [name] is a 80 y.o. female with past medical history of CAD, CHF, COPD presents to the emergency department for evaluation of cough with chest discomfort, body aches, leg swelling.  Symptoms been worsening over the past 5 days.  She has a mild sore throat.  Denies fevers.  She is been compliant with her medications including torsemide, albuterol at home, and other medications.  She has noticed some worsening swelling in her legs especially the right leg along with some redness and blistering which is developed.  She states this often happens and she builds up too much fluid.  She was initially seen at urgent care but referred here for further evaluation. No radiation of symptoms or modifying factors.    Past Medical History:  Diagnosis Date   A-fib [facility])    Allergy    Anxiety    Arthritis    CAD (coronary artery disease)    Mild per remote cath in 2006, /    Nuclear, January, 2013, no significant abnormality,   Carotid artery disease ([place])    Doppler, January, 2013, 0 51% RIC A., 02-58% LICA, stable   Cataract    CHF (congestive heart failure) ([place])    Complication of anesthesia    wakes up "mean"   COPD 02/16/2007   Intolerant of Spiriva, tudorza Intolerant of Trelegy    COPD (chronic obstructive pulmonary disease) ([place])    Coronary atherosclerosis 11/24/2008   Catheterization 2006, nonobstructive coronary disease   //   nuclear 2013, low risk     Diabetes mellitus, type 2 ([place])    Diverticula of colon    DIVERTICULOSIS OF COLON 03/23/2007   Qualifier: Diagnosis of  By: Halford Chessman MD, Vineet     Dizziness    occasional   Dizzy spells 05/04/2011   DM (diabetes mellitus) ([place]) 03/23/2007   Qualifier: Diagnosis of  By: Halford Chessman MD, Vineet     Dyspnea    On exertion   Ejection fraction    Essential hypertension  02/16/2007   Qualifier: Diagnosis of  By: Rosana Hoes CMA, Tammy     G E R D 03/23/2007   Qualifier: Diagnosis of  By: Halford Chessman MD, Vineet     Gall bladder disease 04/28/2016   Gastritis and gastroduodenitis    GERD (gastroesophageal reflux disease)    Goiter    hx of   Headache(784.0)    migraine   Hyperlipidemia    Hypertension    Hypothyroidism    HYPOTHYROIDISM, POSTSURGICAL 06/04/2008   Qualifier: Diagnosis of  By: Loanne Drilling MD, Sean A    Left ventricular hypertrophy    Leg edema    occasional swelling   Leg swelling 12/09/2018   Myofascial pain syndrome, cervical 01/06/2018   Obesity    OBESITY 11/24/2008   Qualifier: Diagnosis of  By: Sidney Ace     OBSTRUCTIVE SLEEP APNEA 02/16/2007   Auto CPAP 09/03/13 to 10/02/13 >> used on 30 of 30 nights with average 6 hrs and 3 min.  Average AHI is 3.1 with median CPAP 5 cm H2O and 95 th percentile CPAP 6 cm H2O.     Occipital neuralgia of right side 01/06/2018   OSA (obstructive sleep apnea)    cpap setting of 12   Pain in joint of right hip 01/21/2018   Palpitations 01/21/2011  48 hour Holter, 2015, scattered PACs and PVCs.    Pneumonia    PONV (postoperative nausea and vomiting)    severe after every surgery   Pulmonary hypertension, secondary    RHINITIS 07/21/2010        RUQ abdominal pain    S/P left TKA 07/27/2011   Sinus node dysfunction ([place]) 11/08/2015   Sleep apnea    Spinal stenosis of cervical region 11/07/2014   Urinary frequency 12/09/2018   UTI (urinary tract infection) 08/15/2019   per pt report   VENTRICULAR HYPERTROPHY, LEFT 11/24/2008   Qualifier: Diagnosis of  By: Sidney Ace      Patient Active Problem List   Diagnosis Date Noted   Diastolic heart failure ([place]) 10/16/2020   Acute on chronic diastolic (congestive) heart failure ([place]) 09/27/2020   Polymyalgia rheumatica ([place]) 04/15/2020   Temporal arteritis ([place]) 04/15/2020   Pacemaker - MDT 01/24/2020   Orthostatic lightheadedness 01/24/2020   Chronic osteomyelitis  involving left ankle and foot (HCC)    Paroxysmal A-fib ([place]) 10/30/2019   Paroxysmal atrial fibrillation ([place]) 10/25/2019   Secondary hypercoagulable state ([place]) 10/25/2019   Numbness and tingling of right leg 10/19/2019   Lumbar radiculopathy 10/19/2019   Acute exacerbation of CHF (congestive heart failure) ([place]) 08/13/2019   COPD with acute exacerbation ([place]) 08/13/2019   Acute respiratory failure ([place]) 08/13/2019   Chest pain 08/13/2019   Leukocytosis 08/13/2019   Leg swelling 12/09/2018   Urinary frequency 12/09/2018   Pain in joint of right hip 01/21/2018   Myofascial pain syndrome, cervical 01/06/2018   Occipital neuralgia of right side 01/06/2018   RUQ abdominal pain    Gastritis and gastroduodenitis    Bile duct abnormality    Cholecystitis 04/30/2016   Gall bladder disease 04/28/2016   Sinus node dysfunction ([place]) 11/08/2015   Ejection fraction    Spinal stenosis of cervical region 11/07/2014   Preoperative clearance 10/09/2014   Carotid artery disease ([place])    S/P left TKA 07/27/2011   Dizzy spells 05/04/2011   Palpitations 01/21/2011   RHINITIS 07/21/2010   Hyperlipidemia 11/24/2008   OBESITY 11/24/2008   Coronary atherosclerosis 11/24/2008   VENTRICULAR HYPERTROPHY, LEFT 11/24/2008   HYPOTHYROIDISM, POSTSURGICAL 06/04/2008   Headache 06/04/2008   DM (diabetes mellitus) ([place]) 03/23/2007   G E R D 03/23/2007   DIVERTICULOSIS OF COLON 03/23/2007   GENERALIZED OSTEOARTHROSIS UNSPECIFIED SITE 03/23/2007   OBSTRUCTIVE SLEEP APNEA 02/16/2007   Essential hypertension 02/16/2007   COPD (chronic obstructive pulmonary disease) ([place]) 02/16/2007    Past Surgical History:  Procedure Laterality Date   ABDOMINAL HYSTERECTOMY     with appendectomy and colon polypectomy   AMPUTATION TOE Left 12/15/2019   Procedure: AMPUTATION  LEFT GREAT TOE;  Surgeon: Evelina Bucy, DPM;  Location: [facility];  Service: Podiatry;  Laterality: Left;   ANTERIOR CERVICAL DECOMP/DISCECTOMY  FUSION N/A 11/07/2014   Procedure: Anterior Cervical diskectomy and fusion Cervical four-five, Cervical five-six, Cervical six-seven;  Surgeon: Kary Kos, MD;  Location: [facility];  Service: Neurosurgery;  Laterality: N/A;   APPENDECTOMY     ARTERY BIOPSY Right 02/16/2017   Procedure: BIOPSY TEMPORAL ARTERY;  Surgeon: Katha Cabal, MD;  Location: [facility] ORS;  Service: Vascular;  Laterality: Right;   ARTERY BIOPSY Left 04/16/2020   Procedure: BIOPSY TEMPORAL ARTERY;  Surgeon: Katha Cabal, MD;  Location: [facility] ORS;  Service: Vascular;  Laterality: Left;  [place]  CATARACT EXTRACTION W/ INTRAOCULAR LENS  IMPLANT, BILATERAL Bilateral    CHOLECYSTECTOMY N/A 04/29/2016   Procedure: LAPAROSCOPIC CHOLECYSTECTOMY WITH INTRAOPERATIVE CHOLANGIOGRAM;  Surgeon: Alphonsa Overall, MD;  Location: [facility];  Service: General;  Laterality: N/A;   EP IMPLANTABLE DEVICE N/A 11/08/2015   MRI COMPATABLE -- Procedure: Pacemaker Implant;  Surgeon: Deboraha Sprang, MD;  Location: [facility];  Service: Cardiovascular;  Laterality: N/A;   INSERT / REPLACE / REMOVE PACEMAKER  2017   Dr. Jens Som [facility])   JOINT REPLACEMENT     LUMBAR [place] SURGERY  1990's   x 2   THYROIDECTOMY  2011   TOTAL KNEE ARTHROPLASTY  07/27/2011   Procedure: TOTAL KNEE ARTHROPLASTY;  Surgeon: Mauri Pole, MD;  Location: [facility];  Service: Orthopedics;  Laterality: Left;   UPPER ESOPHAGEAL ENDOSCOPIC ULTRASOUND (EUS) N/A 05/14/2016   Procedure: UPPER ESOPHAGEAL ENDOSCOPIC ULTRASOUND (EUS);  Surgeon: Milus Banister, MD;  Location: [facility];  Service: Endoscopy;  Laterality: N/A;  radial only-will be done mod if needed    Allergies Dulaglutide, Oxycodone, and Trelegy ellipta [fluticasone-umeclidin-vilant]  Family History  Problem Relation Age of Onset   Heart Problems Mother    Heart defect Mother        valve problems   Heart Problems Father    Heart disease Brother     Lung cancer Daughter        non small cell    Diabetes Maternal Aunt    Diabetes Maternal Uncle    Throat cancer Brother    Colon cancer Neg Hx    Colon polyps Neg Hx    Esophageal cancer Neg Hx    Rectal cancer Neg Hx    Stomach cancer Neg Hx     Social History Social History   Tobacco Use   Smoking status: Former    Packs/day: 2.00    Years: 33.00    Pack years: 66.00    Types: Cigarettes    Start date: 1966    Quit date: 05/04/1997    Years since quitting: 23.8   Smokeless tobacco: Never  Vaping Use   Vaping Use: Never used  Substance Use Topics   Alcohol use: No   Drug use: No    Review of Systems  Constitutional: No fever/chills Eyes: No visual changes. ENT: Mild sore throat. Cardiovascular: Positive chest pain. Positive leg swelling worse on the right.  Respiratory: Positive shortness of breath and cough.  Gastrointestinal: No abdominal pain.  No nausea, no vomiting.  No diarrhea.  No constipation. Genitourinary: Negative for dysuria. Musculoskeletal: Negative for back pain. Skin: Positive rash and blistering to the right leg.  Neurological: Negative for headaches, focal weakness or numbness.  10-point ROS otherwise negative.  ____________________________________________   PHYSICAL EXAM:  VITAL SIGNS: ED Triage Vitals  Enc Vitals Group     BP 03/18/21 1321 (!) 172/61     Pulse Rate 03/18/21 1321 77     Resp 03/18/21 1321 16     Temp 03/18/21 1321 98.4 F (36.9 C)     Temp Source 03/18/21 1321 Oral     SpO2 03/18/21 1321 92 %   Constitutional: Alert and oriented. Well appearing and in no acute distress. Eyes: Conjunctivae are normal Head: Atraumatic. Nose: No congestion/rhinnorhea. Mouth/Throat: Mucous membranes are moist.  Neck: No stridor.   Cardiovascular: Normal rate, regular rhythm. Good peripheral circulation. Grossly normal heart sounds.   Respiratory: Normal respiratory effort.  No retractions. Lungs with coarse sounds and wheezing with  frequent bronchospastic cough.  Gastrointestinal: Soft and nontender. No distention.  Musculoskeletal: No lower extremity tenderness with 3+ pitting edema on the RLE and 1+ on the LLE. No gross deformities of extremities. Neurologic:  Normal speech and language. No gross focal neurologic deficits are appreciated.  Skin:  Skin is warm with erythema and blistering in the right lower extremity.  Not present on the left.  Warm to touch.  No purulence.    ____________________________________________   LABS (all labs ordered are listed, but only abnormal results are displayed)  Labs Reviewed  BASIC METABOLIC PANEL - Abnormal; Notable for the following components:      Result Value   Glucose, Bld 279 (*)    BUN 38 (*)    Creatinine, Ser 1.48 (*)    GFR, Estimated 36 (*)    All other components within normal limits  CBC WITH DIFFERENTIAL/PLATELET - Abnormal; Notable for the following components:   WBC 15.8 (*)    RBC 3.69 (*)    Hemoglobin 10.8 (*)    HCT 34.9 (*)    RDW 16.8 (*)    Neutro Abs 12.8 (*)    Monocytes Absolute 1.3 (*)    Abs Immature Granulocytes 0.12 (*)    All other components within normal limits  RESP PANEL BY RT-PCR (FLU A&B, COVID) ARPGX2  BRAIN NATRIURETIC PEPTIDE  TROPONIN I (HIGH SENSITIVITY)  TROPONIN I (HIGH SENSITIVITY)   ____________________________________________  EKG   EKG Interpretation  Date/Time:  Tuesday March 18 2021 13:29:05 EST Ventricular Rate:  64 PR Interval:  214 QRS Duration: 92 QT Interval:  425 QTC Calculation: 439 R Axis:   47 Text Interpretation: Age not entered, assumed to be  80 years old for purpose of ECG interpretation Sinus rhythm Prolonged PR interval Confirmed by Nanda Quinton [phone]) on 03/18/2021 8:54:04 PM        ____________________________________________  RADIOLOGY  DG Chest 2 View  Result Date: 03/18/2021 CLINICAL DATA:  Shortness of breath. Cough and chest pain for 4 days. EXAM: CHEST - 2 VIEW  COMPARISON:  Radiographs 01/31/2021 and CT 11/14/2020. FINDINGS: Patient is mildly rotated to the right on the AP view. The left subclavian pacemaker leads appear unchanged at the level of the right atrium and right ventricle. The heart size and mediastinal contours are stable. There is central airway thickening without confluent airspace opacity, pleural effusion or pneumothorax. Mild degenerative changes are present throughout the spine. Patient is status post lower cervical fusion and thyroidectomy. IMPRESSION: Central airway thickening suspicious for bronchitis. Otherwise stable chest. Electronically Signed   By: Richardean Sale M.D.   On: 03/18/2021 14:40    ____________________________________________   PROCEDURES  Procedure(s) performed:   Procedures  None ____________________________________________   INITIAL IMPRESSION / ASSESSMENT AND PLAN / ED COURSE  Pertinent labs & imaging results that were available during my care of the patient were reviewed by me and considered in my medical decision making (see chart for details).   Patient presents to the emergency department with cough, congestion, body aches.  Sent for evaluation of pneumonia.  She is not hypoxemic but does have some slight increased work of breathing along with wheezing and coarse breath sounds.  Bronchitis noted on chest x-ray.  Patient noted to have a leukocytosis.  Unclear if this is developing respiratory infection versus some component of cellulitis in the right lower extremity.  Has had prior DVT work-up with asymmetric leg swelling in the past which has been negative.  She is afebrile here.  Plan for Lasix along with DuoNeb.  Will give antibiotics as well covering both for possible respiratory infection versus cellulitis.  I have added a troponin along with BNP.   Troponin WNL. BNP pending. Plan for plan as above and admit.   Discussed patient's case with [facility] to request admission. Patient and family (if present)  updated with plan. Care transferred to [facility] service.  I reviewed all nursing notes, vitals, pertinent old records, EKGs, labs, imaging (as available).  ____________________________________________  FINAL CLINICAL IMPRESSION(S) / ED DIAGNOSES  Final diagnoses:  Precordial chest pain  Acute cough  Leg swelling    MEDICATIONS GIVEN DURING THIS VISIT:  Medications  albuterol (VENTOLIN HFA) 108 (90 Base) MCG/ACT inhaler 2 puff (has no administration in time range)  azithromycin (ZITHROMAX) 500 mg in sodium chloride 0.9 % 250 mL IVPB (500 mg Intravenous New Bag/Given 03/18/21 2257)  furosemide (LASIX) injection 60 mg (60 mg Intravenous Given 03/18/21 2148)  ipratropium-albuterol (DUONEB) 0.5-2.5 (3) MG/3ML nebulizer solution 3 mL (3 mLs Nebulization Given 03/18/21 2150)  cefTRIAXone (ROCEPHIN) 1 g in sodium chloride 0.9 % 100 mL IVPB (1 g Intravenous New Bag/Given 03/18/21 2200)    Note:  This document was prepared using Dragon voice recognition software and may include unintentional dictation errors.  Nanda Quinton, MD, [facility] Emergency [facility]    Jaisen Wiltrout, [facility], MD 03/18/21 [phone]

## 2021-03-18 NOTE — ED Triage Notes (Signed)
Pt c/o cough, body aches, headache,   Denies sore throat, nausea, vomiting, diarrhea, constipation.   Onset a few days ago.   Also initial o2 was low 80's after a few minutes of rest o2 is now 92. States she sees pulm and has been about 3 months since last visit. Has COPD and checks o2 at home.

## 2021-03-18 NOTE — ED Triage Notes (Signed)
Pt presents with c/o cough and chest pain. Pt reports she went to UC and they sent her here for eval. Pt reports symptoms present for 4 days.

## 2021-03-18 NOTE — Discharge Instructions (Signed)
Go to the emergency room for further evaluation and management.

## 2021-03-18 NOTE — ED Provider Notes (Signed)
Emergency Medicine Provider Triage Evaluation Note  Kayla [name] , a 80 y.o. female  was evaluated in triage.  Pt complains of URI symptoms over the past few days.  Reporting it is getting worse with shortness of breath and chest pain.  Seen at urgent care and sent here to rule out pneumonia.  Review of Systems  Positive: As above Negative: Fevers, chills  Physical Exam  BP (!) 172/61 (BP Location: Left Arm)   Pulse 77   Temp 98.4 F (36.9 C) (Oral)   Resp 16   SpO2 92%  Gen:   Awake, no distress   Resp:  Increased effort  MSK:   Moves extremities without difficulty  Other:  Wheezes and rhonchi bilaterally.  Worse on the right side.  Patient with difficulty taking deep breaths. Medical Decision Making  Medically screening exam initiated at 1:33 PM.  Appropriate orders placed.  [name] was informed that the remainder of the evaluation will be completed by another provider, this initial triage assessment does not replace that evaluation, and the importance of remaining in the ED until their evaluation is complete.     Darliss Ridgel 03/18/21 1334    Lacretia Leigh, MD 03/19/21 [phone]

## 2021-03-19 DIAGNOSIS — E1142 Type 2 diabetes mellitus with diabetic polyneuropathy: Secondary | ICD-10-CM | POA: Diagnosis present

## 2021-03-19 DIAGNOSIS — N183 Chronic kidney disease, stage 3 unspecified: Secondary | ICD-10-CM | POA: Diagnosis present

## 2021-03-19 DIAGNOSIS — N1832 Chronic kidney disease, stage 3b: Secondary | ICD-10-CM | POA: Diagnosis present

## 2021-03-19 DIAGNOSIS — M353 Polymyalgia rheumatica: Secondary | ICD-10-CM | POA: Diagnosis present

## 2021-03-19 DIAGNOSIS — I152 Hypertension secondary to endocrine disorders: Secondary | ICD-10-CM | POA: Diagnosis present

## 2021-03-19 DIAGNOSIS — E890 Postprocedural hypothyroidism: Secondary | ICD-10-CM | POA: Diagnosis present

## 2021-03-19 DIAGNOSIS — Z20822 Contact with and (suspected) exposure to covid-19: Secondary | ICD-10-CM | POA: Diagnosis present

## 2021-03-19 DIAGNOSIS — E1122 Type 2 diabetes mellitus with diabetic chronic kidney disease: Secondary | ICD-10-CM | POA: Diagnosis present

## 2021-03-19 DIAGNOSIS — Z66 Do not resuscitate: Secondary | ICD-10-CM | POA: Diagnosis present

## 2021-03-19 DIAGNOSIS — E1165 Type 2 diabetes mellitus with hyperglycemia: Secondary | ICD-10-CM | POA: Diagnosis present

## 2021-03-19 DIAGNOSIS — D631 Anemia in chronic kidney disease: Secondary | ICD-10-CM | POA: Diagnosis present

## 2021-03-19 DIAGNOSIS — J9601 Acute respiratory failure with hypoxia: Secondary | ICD-10-CM | POA: Diagnosis present

## 2021-03-19 DIAGNOSIS — R0609 Other forms of dyspnea: Secondary | ICD-10-CM | POA: Diagnosis not present

## 2021-03-19 DIAGNOSIS — R072 Precordial pain: Secondary | ICD-10-CM | POA: Diagnosis present

## 2021-03-19 DIAGNOSIS — I5032 Chronic diastolic (congestive) heart failure: Secondary | ICD-10-CM | POA: Diagnosis not present

## 2021-03-19 DIAGNOSIS — N179 Acute kidney failure, unspecified: Secondary | ICD-10-CM | POA: Diagnosis present

## 2021-03-19 DIAGNOSIS — E669 Obesity, unspecified: Secondary | ICD-10-CM | POA: Diagnosis present

## 2021-03-19 DIAGNOSIS — L03115 Cellulitis of right lower limb: Secondary | ICD-10-CM | POA: Diagnosis present

## 2021-03-19 DIAGNOSIS — J441 Chronic obstructive pulmonary disease with (acute) exacerbation: Secondary | ICD-10-CM | POA: Diagnosis present

## 2021-03-19 DIAGNOSIS — G9341 Metabolic encephalopathy: Secondary | ICD-10-CM | POA: Diagnosis not present

## 2021-03-19 DIAGNOSIS — E785 Hyperlipidemia, unspecified: Secondary | ICD-10-CM | POA: Diagnosis present

## 2021-03-19 DIAGNOSIS — E1169 Type 2 diabetes mellitus with other specified complication: Secondary | ICD-10-CM | POA: Diagnosis present

## 2021-03-19 DIAGNOSIS — I272 Pulmonary hypertension, unspecified: Secondary | ICD-10-CM | POA: Diagnosis present

## 2021-03-19 DIAGNOSIS — J440 Chronic obstructive pulmonary disease with acute lower respiratory infection: Secondary | ICD-10-CM | POA: Diagnosis present

## 2021-03-19 DIAGNOSIS — I480 Paroxysmal atrial fibrillation: Secondary | ICD-10-CM | POA: Diagnosis not present

## 2021-03-19 DIAGNOSIS — I2510 Atherosclerotic heart disease of native coronary artery without angina pectoris: Secondary | ICD-10-CM | POA: Diagnosis present

## 2021-03-19 DIAGNOSIS — I495 Sick sinus syndrome: Secondary | ICD-10-CM | POA: Diagnosis present

## 2021-03-19 DIAGNOSIS — R079 Chest pain, unspecified: Secondary | ICD-10-CM | POA: Diagnosis not present

## 2021-03-19 LAB — BASIC METABOLIC PANEL
Anion gap: 10 (ref 5–15)
BUN: 37 mg/dL — ABNORMAL HIGH (ref 8–23)
CO2: 27 mmol/L (ref 22–32)
Calcium: 9.1 mg/dL (ref 8.9–10.3)
Chloride: 101 mmol/L (ref 98–111)
Creatinine, Ser: 1.61 mg/dL — ABNORMAL HIGH (ref 0.44–1.00)
GFR, Estimated: 32 mL/min — ABNORMAL LOW (ref >60)
Glucose, Bld: 183 mg/dL — ABNORMAL HIGH (ref 70–99)
Potassium: 4.6 mmol/L (ref 3.5–5.1)
Sodium: 138 mmol/L (ref 135–145)

## 2021-03-19 LAB — CBC
HCT: 31.5 % — ABNORMAL LOW (ref 36.0–46.0)
Hemoglobin: 10 g/dL — ABNORMAL LOW (ref 12.0–15.0)
MCH: 29.2 pg (ref 26.0–34.0)
MCHC: 31.7 g/dL (ref 30.0–36.0)
MCV: 92.1 fL (ref 80.0–100.0)
Platelets: 362 10*3/uL (ref 150–400)
RBC: 3.42 MIL/uL — ABNORMAL LOW (ref 3.87–5.11)
RDW: 16.5 % — ABNORMAL HIGH (ref 11.5–15.5)
WBC: 15.7 10*3/uL — ABNORMAL HIGH (ref 4.0–10.5)
nRBC: 0 % (ref 0.0–0.2)

## 2021-03-19 LAB — CBG MONITORING, ED
Glucose-Capillary: 254 mg/dL — ABNORMAL HIGH (ref 70–99)
Glucose-Capillary: 397 mg/dL — ABNORMAL HIGH (ref 70–99)

## 2021-03-19 LAB — GLUCOSE, CAPILLARY
Glucose-Capillary: 430 mg/dL — ABNORMAL HIGH (ref 70–99)
Glucose-Capillary: 404 mg/dL — ABNORMAL HIGH (ref 70–99)

## 2021-03-19 MED ORDER — GUAIFENESIN ER 600 MG PO TB12
1200.0000 mg | ORAL_TABLET | Freq: Two times a day (BID) | ORAL | Status: AC
  Administered 2021-03-19 – 2021-03-22 (×6): 1200 mg via ORAL
  Filled 2021-03-19 (×6): qty 2

## 2021-03-19 MED ORDER — AMIODARONE HCL 200 MG PO TABS
200.0000 mg | ORAL_TABLET | Freq: Every day | ORAL | Status: DC
  Administered 2021-03-19 – 2021-03-22 (×4): 200 mg via ORAL
  Filled 2021-03-19 (×4): qty 1

## 2021-03-19 MED ORDER — GABAPENTIN 400 MG PO CAPS
800.0000 mg | ORAL_CAPSULE | Freq: Three times a day (TID) | ORAL | Status: DC
  Administered 2021-03-19 (×3): 800 mg via ORAL
  Filled 2021-03-19 (×4): qty 2

## 2021-03-19 MED ORDER — ONDANSETRON HCL 4 MG/2ML IJ SOLN: 4.0000 mg | Freq: Four times a day (QID) | INTRAMUSCULAR | Status: DC | PRN

## 2021-03-19 MED ORDER — ACETAMINOPHEN 325 MG PO TABS
650.0000 mg | ORAL_TABLET | Freq: Four times a day (QID) | ORAL | Status: DC | PRN
  Administered 2021-03-19: 650 mg via ORAL
  Filled 2021-03-19 (×2): qty 2

## 2021-03-19 MED ORDER — SODIUM CHLORIDE 3 % IN NEBU
4.0000 mL | INHALATION_SOLUTION | Freq: Two times a day (BID) | RESPIRATORY_TRACT | Status: DC
  Filled 2021-03-19: qty 4

## 2021-03-19 MED ORDER — IPRATROPIUM-ALBUTEROL 0.5-2.5 (3) MG/3ML IN SOLN
3.0000 mL | Freq: Four times a day (QID) | RESPIRATORY_TRACT | Status: DC
  Administered 2021-03-19 – 2021-03-20 (×8): 3 mL via RESPIRATORY_TRACT
  Filled 2021-03-19 (×9): qty 3

## 2021-03-19 MED ORDER — BUDESON-GLYCOPYRROL-FORMOTEROL 160-9-4.8 MCG/ACT IN AERO: 2.0000 | INHALATION_SPRAY | Freq: Two times a day (BID) | RESPIRATORY_TRACT | Status: DC

## 2021-03-19 MED ORDER — LOSARTAN POTASSIUM 50 MG PO TABS
50.0000 mg | ORAL_TABLET | Freq: Every day | ORAL | Status: DC
  Administered 2021-03-19 – 2021-03-22 (×4): 50 mg via ORAL
  Filled 2021-03-19 (×3): qty 1
  Filled 2021-03-19: qty 2

## 2021-03-19 MED ORDER — METOPROLOL SUCCINATE ER 50 MG PO TB24
100.0000 mg | ORAL_TABLET | Freq: Every day | ORAL | Status: DC
  Administered 2021-03-19 – 2021-03-22 (×4): 100 mg via ORAL
  Filled 2021-03-19 (×4): qty 2

## 2021-03-19 MED ORDER — INSULIN GLARGINE-YFGN 100 UNIT/ML ~~LOC~~ SOLN
80.0000 [IU] | Freq: Two times a day (BID) | SUBCUTANEOUS | Status: DC
  Administered 2021-03-19 – 2021-03-22 (×8): 80 [IU] via SUBCUTANEOUS
  Filled 2021-03-19 (×8): qty 0.8

## 2021-03-19 MED ORDER — AZITHROMYCIN 250 MG PO TABS
250.0000 mg | ORAL_TABLET | Freq: Every day | ORAL | Status: AC
  Administered 2021-03-19 – 2021-03-22 (×4): 250 mg via ORAL
  Filled 2021-03-19 (×4): qty 1

## 2021-03-19 MED ORDER — GUAIFENESIN-DM 100-10 MG/5ML PO SYRP
5.0000 mL | ORAL_SOLUTION | ORAL | Status: DC | PRN
  Administered 2021-03-19: 5 mL via ORAL
  Filled 2021-03-19: qty 10

## 2021-03-19 MED ORDER — ALLOPURINOL 100 MG PO TABS
100.0000 mg | ORAL_TABLET | Freq: Two times a day (BID) | ORAL | Status: DC
  Administered 2021-03-19 – 2021-03-22 (×7): 100 mg via ORAL
  Filled 2021-03-19 (×8): qty 1

## 2021-03-19 MED ORDER — FLUTICASONE FUROATE-VILANTEROL 100-25 MCG/ACT IN AEPB
1.0000 | INHALATION_SPRAY | Freq: Every day | RESPIRATORY_TRACT | Status: DC
  Administered 2021-03-19 – 2021-03-22 (×4): 1 via RESPIRATORY_TRACT
  Filled 2021-03-19: qty 28

## 2021-03-19 MED ORDER — TORSEMIDE 20 MG PO TABS
80.0000 mg | ORAL_TABLET | Freq: Every day | ORAL | Status: DC
  Administered 2021-03-19: 80 mg via ORAL
  Filled 2021-03-19 (×2): qty 4

## 2021-03-19 MED ORDER — HYDROCODONE-ACETAMINOPHEN 5-325 MG PO TABS
1.0000 | ORAL_TABLET | ORAL | Status: DC | PRN
  Administered 2021-03-19 – 2021-03-21 (×7): 1 via ORAL
  Filled 2021-03-19 (×7): qty 1

## 2021-03-19 MED ORDER — METHYLPREDNISOLONE SODIUM SUCC 40 MG IJ SOLR
40.0000 mg | Freq: Two times a day (BID) | INTRAMUSCULAR | Status: DC
  Administered 2021-03-19 – 2021-03-21 (×7): 40 mg via INTRAVENOUS
  Filled 2021-03-19 (×7): qty 1

## 2021-03-19 MED ORDER — ONDANSETRON HCL 4 MG PO TABS: 4.0000 mg | ORAL_TABLET | Freq: Four times a day (QID) | ORAL | Status: DC | PRN

## 2021-03-19 MED ORDER — INSULIN ASPART 100 UNIT/ML IJ SOLN
0.0000 [IU] | Freq: Every day | INTRAMUSCULAR | Status: DC
  Administered 2021-03-19: 5 [IU] via SUBCUTANEOUS
  Administered 2021-03-20: 23:00:00 3 [IU] via SUBCUTANEOUS
  Administered 2021-03-21: 5 [IU] via SUBCUTANEOUS

## 2021-03-19 MED ORDER — GUAIFENESIN ER 600 MG PO TB12
600.0000 mg | ORAL_TABLET | Freq: Two times a day (BID) | ORAL | Status: DC
  Administered 2021-03-19: 600 mg via ORAL
  Filled 2021-03-19: qty 1

## 2021-03-19 MED ORDER — UMECLIDINIUM BROMIDE 62.5 MCG/ACT IN AEPB
1.0000 | INHALATION_SPRAY | Freq: Every day | RESPIRATORY_TRACT | Status: DC
  Administered 2021-03-19 – 2021-03-22 (×4): 1 via RESPIRATORY_TRACT
  Filled 2021-03-19: qty 7

## 2021-03-19 MED ORDER — INSULIN ASPART 100 UNIT/ML IJ SOLN
0.0000 [IU] | Freq: Three times a day (TID) | INTRAMUSCULAR | Status: DC
  Administered 2021-03-19: 20 [IU] via SUBCUTANEOUS
  Administered 2021-03-20: 12:00:00 15 [IU] via SUBCUTANEOUS
  Administered 2021-03-20 (×2): 11 [IU] via SUBCUTANEOUS
  Administered 2021-03-21: 7 [IU] via SUBCUTANEOUS
  Administered 2021-03-21: 15 [IU] via SUBCUTANEOUS
  Administered 2021-03-21 – 2021-03-22 (×2): 7 [IU] via SUBCUTANEOUS

## 2021-03-19 MED ORDER — ALBUTEROL SULFATE (2.5 MG/3ML) 0.083% IN NEBU
2.5000 mg | INHALATION_SOLUTION | RESPIRATORY_TRACT | Status: DC | PRN
  Administered 2021-03-22: 2.5 mg via RESPIRATORY_TRACT
  Filled 2021-03-19: qty 3

## 2021-03-19 MED ORDER — ATORVASTATIN CALCIUM 40 MG PO TABS
40.0000 mg | ORAL_TABLET | Freq: Every day | ORAL | Status: DC
  Administered 2021-03-19 – 2021-03-22 (×4): 40 mg via ORAL
  Filled 2021-03-19 (×4): qty 1

## 2021-03-19 MED ORDER — SODIUM CHLORIDE 0.9% FLUSH
3.0000 mL | Freq: Two times a day (BID) | INTRAVENOUS | Status: DC
  Administered 2021-03-19 – 2021-03-22 (×5): 3 mL via INTRAVENOUS

## 2021-03-19 MED ORDER — BENZONATATE 100 MG PO CAPS
200.0000 mg | ORAL_CAPSULE | Freq: Three times a day (TID) | ORAL | Status: DC | PRN
  Administered 2021-03-19 – 2021-03-20 (×3): 200 mg via ORAL
  Filled 2021-03-19 (×3): qty 2

## 2021-03-19 MED ORDER — APIXABAN 5 MG PO TABS
5.0000 mg | ORAL_TABLET | Freq: Two times a day (BID) | ORAL | Status: DC
  Administered 2021-03-19 – 2021-03-22 (×8): 5 mg via ORAL
  Filled 2021-03-19 (×9): qty 1

## 2021-03-19 MED ORDER — CEFAZOLIN SODIUM-DEXTROSE 2-4 GM/100ML-% IV SOLN
2.0000 g | Freq: Three times a day (TID) | INTRAVENOUS | Status: DC
  Administered 2021-03-19 – 2021-03-22 (×10): 2 g via INTRAVENOUS
  Filled 2021-03-19 (×12): qty 100

## 2021-03-19 MED ORDER — SODIUM CHLORIDE 3 % IN NEBU
4.0000 mL | INHALATION_SOLUTION | Freq: Two times a day (BID) | RESPIRATORY_TRACT | Status: DC
  Administered 2021-03-20 – 2021-03-22 (×4): 4 mL via RESPIRATORY_TRACT
  Filled 2021-03-19 (×4): qty 4

## 2021-03-19 MED ORDER — AMLODIPINE BESYLATE 5 MG PO TABS
5.0000 mg | ORAL_TABLET | Freq: Every day | ORAL | Status: DC
  Administered 2021-03-19 – 2021-03-22 (×4): 5 mg via ORAL
  Filled 2021-03-19 (×4): qty 1

## 2021-03-19 MED ORDER — INSULIN ASPART 100 UNIT/ML IJ SOLN
0.0000 [IU] | Freq: Three times a day (TID) | INTRAMUSCULAR | Status: DC
  Administered 2021-03-19: 9 [IU] via SUBCUTANEOUS
  Administered 2021-03-19: 5 [IU] via SUBCUTANEOUS
  Filled 2021-03-19: qty 0.09

## 2021-03-19 MED ORDER — ACETAMINOPHEN 650 MG RE SUPP: 650.0000 mg | Freq: Four times a day (QID) | RECTAL | Status: DC | PRN

## 2021-03-19 MED ORDER — LEVOTHYROXINE SODIUM 112 MCG PO TABS
224.0000 ug | ORAL_TABLET | Freq: Every day | ORAL | Status: DC
  Administered 2021-03-19 – 2021-03-22 (×3): 224 ug via ORAL
  Filled 2021-03-19 (×3): qty 2

## 2021-03-19 NOTE — Progress Notes (Signed)
PROGRESS NOTE  Kayla [name] LGX:[id number] DOB: Jul 22, 1940 DOA: 03/18/2021 PCP: [name], MD  HPI/Recap of past 24 hours: Kayla [name] is a 80 y.o. female with medical history significant for paroxysmal atrial fibrillation on Eliquis and amiodarone, HFpEF (last EF 60-65%, G2 DD by TTE 10/03/2020), COPD with chronic bronchitis, CKD stage IIIb, insulin-dependent T2DM, HTN, HLD, hypothyroidism, and OSA on CPAP who presented to [facility] ED for evaluation of cough and worsening shortness of breath.  Work-up revealed acute COPD exacerbation, acute hypoxic respiratory failure requiring 2 L O2 nasal cannula, for which she was started on azithromycin and IV Solu-Medrol.  Patient endorsed worsening right lower extremity tenderness, erythema and edema, noted to have right lower extremity cellulitis and was started on IV cefazolin.  03/19/2021: Patient was seen and examined at her bedside in the ED.  Her husband was present in the room.  She reports pain in her right lower extremity which appears erythematous, edematous and warm to the touch.  She also has some tenderness with minimal palpation.  Assessment/Plan: Principal Problem:   COPD with acute exacerbation (HCC) Active Problems:   HYPOTHYROIDISM, POSTSURGICAL   Insulin dependent type 2 diabetes mellitus ([place])   Hyperlipidemia associated with type 2 diabetes mellitus ([place])   OBSTRUCTIVE SLEEP APNEA   Hypertension associated with diabetes ([place])   Acute respiratory failure with hypoxia (HCC)   Paroxysmal A-fib (HCC)   Cellulitis of right lower extremity   CKD (chronic kidney disease), stage III (HCC)   COPD with exacerbation (HCC)  Acute COPD exacerbation with bronchitis  Presented with worsening shortness of breath, a productive cough, and wheezing. No evidence of pneumonia on chest x-ray, personally reviewed. Personally reviewed chest x-ray which showed central airway thickening suspicious for bronchitis. She was started on  azithromycin and IV Solu-Medrol 40 mg twice daily, continue Start pulmonary toilet, Mucinex 1200 mg twice daily x3 days, saline nebs twice daily x3 days. Continue bronchodilators Incentive spirometer Mobilize as tolerated.  Acute hypoxic respiratory failure secondary to COPD exacerbation Not on oxygen supplementation at baseline Currently requiring 2 L to maintain O2 saturation greater than 88 % O2 saturation 88% on room air.  Right lower extremity cellulitis, POA Failed outpatient antibiotics therapy x2. Obtain MRSA screening test. Continue IV Ancef for now. Follow blood cultures x2 peripherally.  Type 2 diabetes with hyperglycemia likely exacerbated by IV steroids Last hemoglobin A1c 8.4 on 01/30/2021 Continue long-acting insulin and insulin sliding scale. Continue to closely monitor CBGs  CKD 3B Appears to be at her baseline creatinine 1.6 with GFR 32. Avoid nephrotoxic agents Closely monitor urine output Repeat BMP in the morning  Anemia of chronic disease Hemoglobin 10.0, close to baseline Monitor H&H No signs of overt bleeding.  Paroxysmal A. fib on Eliquis Resume home regimen, along with amiodarone. Continue to closely monitor on telemetry  Diabetic polyneuropathy Continue home regimen, on gabapentin 800 mg 3 times daily.  Hypertension BP stable Resume home regimen Continue to monitor vital signs.  Hyperlipidemia Continue home Lipitor.   Code Status: DNR  Family Communication: Husband at bedside  Disposition Plan: Likely will discharge to home once cellulitis has improved.   Consultants: None.  Procedures: None.  Antimicrobials: Azithromycin Ancef.  DVT prophylaxis: Eliquis  Status is: Inpatient  Inpatient status.  Patient will require least 2 midnights for further evaluation and treatment of present condition.      Objective: Vitals:   03/19/21 0900 03/19/21 0950 03/19/21 1301 03/19/21 1648  BP: (!) 156/80  Marland Kitchen)  155/75 (!) 147/64   Pulse: 73  91 62  Resp: 18  (!) 24 16  Temp:    98.2 F (36.8 C)  TempSrc:   Oral Oral  SpO2: 94% (!) 89% (!) 88% 95%    Intake/Output Summary (Last 24 hours) at 03/19/2021 1713 Last data filed at 03/19/2021 1415 Gross per 24 hour  Intake 550 ml  Output --  Net 550 ml   There were no vitals filed for this visit.  Exam:  General: 80 y.o. year-old female well developed well nourished in no acute distress.  Alert and oriented x3. Cardiovascular: Regular rate and rhythm with no rubs or gallops.  No thyromegaly or JVD noted.   Respiratory: Mild rales at bases with no wheezing noted. Good inspiratory effort. Abdomen: Soft nontender nondistended with normal bowel sounds x4 quadrants. Musculoskeletal: No lower extremity edema. 2/4 pulses in all 4 extremities. Skin: Right lower extremity, skin is warm, erythematous, edematous and tender with palpation. Psychiatry: Mood is appropriate for condition and setting Neuro: Moves all 4 extremities freely.   Data Reviewed: CBC: Recent Labs  Lab 03/18/21 1333 03/19/21 0500  WBC 15.8* 15.7*  NEUTROABS 12.8*  --   HGB 10.8* 10.0*  HCT 34.9* 31.5*  MCV 94.6 92.1  PLT 388 250   Basic Metabolic Panel: Recent Labs  Lab 03/18/21 1333 03/19/21 0500  NA 135 138  K 4.9 4.6  CL 98 101  CO2 26 27  GLUCOSE 279* 183*  BUN 38* 37*  CREATININE 1.48* 1.61*  CALCIUM 9.6 9.1   GFR: CrCl cannot be calculated (Unknown ideal weight.). Liver Function Tests: No results for input(s): AST, ALT, ALKPHOS, BILITOT, PROT, ALBUMIN in the last 168 hours. No results for input(s): LIPASE, AMYLASE in the last 168 hours. No results for input(s): AMMONIA in the last 168 hours. Coagulation Profile: No results for input(s): INR, PROTIME in the last 168 hours. Cardiac Enzymes: No results for input(s): CKTOTAL, CKMB, CKMBINDEX, TROPONINI in the last 168 hours. BNP (last 3 results) Recent Labs    09/24/20 1128  PROBNP 562   HbA1C: No results for  input(s): HGBA1C in the last 72 hours. CBG: Recent Labs  Lab 03/19/21 0748 03/19/21 1216  GLUCAP 254* 397*   Lipid Profile: No results for input(s): CHOL, HDL, LDLCALC, TRIG, CHOLHDL, LDLDIRECT in the last 72 hours. Thyroid Function Tests: No results for input(s): TSH, T4TOTAL, FREET4, T3FREE, THYROIDAB in the last 72 hours. Anemia Panel: No results for input(s): VITAMINB12, FOLATE, FERRITIN, TIBC, IRON, RETICCTPCT in the last 72 hours. Urine analysis:    Component Value Date/Time   COLORURINE YELLOW 08/15/2019 2257   APPEARANCEUR HAZY (A) 08/15/2019 2257   LABSPEC 1.014 08/15/2019 2257   PHURINE 5.0 08/15/2019 2257   GLUCOSEU NEGATIVE 08/15/2019 2257   GLUCOSEU NEGATIVE 12/09/2018 0944   HGBUR NEGATIVE 08/15/2019 2257   BILIRUBINUR NEGATIVE 08/15/2019 2257   KETONESUR NEGATIVE 08/15/2019 2257   PROTEINUR 100 (A) 08/15/2019 2257   UROBILINOGEN 0.2 12/09/2018 0944   NITRITE NEGATIVE 08/15/2019 2257   LEUKOCYTESUR MODERATE (A) 08/15/2019 2257   Sepsis Labs: @LABRCNTIP (procalcitonin:4,lacticidven:4)  ) Recent Results (from the past 240 hour(s))  Resp Panel by RT-PCR (Flu A&B, Covid) Nasopharyngeal Swab     Status: None   Collection Time: 03/18/21  1:33 PM   Specimen: Nasopharyngeal Swab; Nasopharyngeal(NP) swabs in vial transport medium  Result Value Ref Range Status   SARS Coronavirus 2 by RT PCR NEGATIVE NEGATIVE Final    Comment: (NOTE) SARS-CoV-2 target nucleic acids  are NOT DETECTED.  The SARS-CoV-2 RNA is generally detectable in upper respiratory specimens during the acute phase of infection. The lowest concentration of SARS-CoV-2 viral copies this assay can detect is 138 copies/mL. A negative result does not preclude SARS-Cov-2 infection and should not be used as the sole basis for treatment or other patient management decisions. A negative result may occur with  improper specimen collection/handling, submission of specimen other than nasopharyngeal swab,  presence of viral mutation(s) within the areas targeted by this assay, and inadequate number of viral copies(<138 copies/mL). A negative result must be combined with clinical observations, patient history, and epidemiological information. The expected result is Negative.  Fact Sheet for Patients:  EntrepreneurPulse.com.au  Fact Sheet for Healthcare Providers:  IncredibleEmployment.be  This test is no t yet approved or cleared by the [place] FDA and  has been authorized for detection and/or diagnosis of SARS-CoV-2 by FDA under an Emergency Use Authorization (EUA). This EUA will remain  in effect (meaning this test can be used) for the duration of the COVID-19 declaration under Section 564(b)(1) of the Act, 21 U.S.C.section 360bbb-3(b)(1), unless the authorization is terminated  or revoked sooner.       Influenza A by PCR NEGATIVE NEGATIVE Final   Influenza B by PCR NEGATIVE NEGATIVE Final    Comment: (NOTE) The Xpert Xpress SARS-CoV-2/FLU/RSV plus assay is intended as an aid in the diagnosis of influenza from Nasopharyngeal swab specimens and should not be used as a sole basis for treatment. Nasal washings and aspirates are unacceptable for Xpert Xpress SARS-CoV-2/FLU/RSV testing.  Fact Sheet for Patients: EntrepreneurPulse.com.au  Fact Sheet for Healthcare Providers: IncredibleEmployment.be  This test is not yet approved or cleared by the [place] FDA and has been authorized for detection and/or diagnosis of SARS-CoV-2 by FDA under an Emergency Use Authorization (EUA). This EUA will remain in effect (meaning this test can be used) for the duration of the COVID-19 declaration under Section 564(b)(1) of the Act, 21 U.S.C. section 360bbb-3(b)(1), unless the authorization is terminated or revoked.  Performed at [facility], [place] [address] [place], [place] [address]        Studies: No results found.  Scheduled Meds:  allopurinol  100 mg Oral BID   amiodarone  200 mg Oral Daily   amLODipine  5 mg Oral Daily   apixaban  5 mg Oral BID   atorvastatin  40 mg Oral Daily   azithromycin  250 mg Oral Daily   fluticasone furoate-vilanterol  1 puff Inhalation Daily   gabapentin  800 mg Oral TID   insulin aspart  0-9 Units Subcutaneous TID WC   insulin glargine-yfgn  80 Units Subcutaneous BID   ipratropium-albuterol  3 mL Nebulization Q6H   levothyroxine  224 mcg Oral Q0600   losartan  50 mg Oral Daily   methylPREDNISolone (SOLU-MEDROL) injection  40 mg Intravenous Q12H   metoprolol succinate  100 mg Oral Daily   sodium chloride flush  3 mL Intravenous Q12H   torsemide  80 mg Oral Daily   umeclidinium bromide  1 puff Inhalation Daily    Continuous Infusions:   ceFAZolin (ANCEF) IV Stopped (03/19/21 1415)     LOS: 0 days     Kayla Memos, MD [facility] Pager [phone]  If 7PM-7AM, please contact night-coverage [email or web address] Password [email or web address] 03/19/2021, 5:13 PM

## 2021-03-19 NOTE — ED Notes (Signed)
Pt ambulated to restroom and back to room.  O2 sat 88% on RA.  2L supplemental O2 placed on pt via [place] w/ improvement in O2 sat to 94%.

## 2021-03-19 NOTE — ED Notes (Signed)
Pt will not leave monitoring equipment on at this time.

## 2021-03-20 DIAGNOSIS — J441 Chronic obstructive pulmonary disease with (acute) exacerbation: Secondary | ICD-10-CM | POA: Diagnosis not present

## 2021-03-20 LAB — BASIC METABOLIC PANEL
Anion gap: 13 (ref 5–15)
BUN: 57 mg/dL — ABNORMAL HIGH (ref 8–23)
CO2: 25 mmol/L (ref 22–32)
Calcium: 8.9 mg/dL (ref 8.9–10.3)
Chloride: 99 mmol/L (ref 98–111)
Creatinine, Ser: 1.86 mg/dL — ABNORMAL HIGH (ref 0.44–1.00)
GFR, Estimated: 27 mL/min — ABNORMAL LOW (ref >60)
Glucose, Bld: 270 mg/dL — ABNORMAL HIGH (ref 70–99)
Potassium: 4.2 mmol/L (ref 3.5–5.1)
Sodium: 137 mmol/L (ref 135–145)

## 2021-03-20 LAB — CBC
HCT: 34.9 % — ABNORMAL LOW (ref 36.0–46.0)
Hemoglobin: 10.9 g/dL — ABNORMAL LOW (ref 12.0–15.0)
MCH: 28.8 pg (ref 26.0–34.0)
MCHC: 31.2 g/dL (ref 30.0–36.0)
MCV: 92.3 fL (ref 80.0–100.0)
Platelets: 450 10*3/uL — ABNORMAL HIGH (ref 150–400)
RBC: 3.78 MIL/uL — ABNORMAL LOW (ref 3.87–5.11)
RDW: 16.3 % — ABNORMAL HIGH (ref 11.5–15.5)
WBC: 15.8 10*3/uL — ABNORMAL HIGH (ref 4.0–10.5)
nRBC: 0 % (ref 0.0–0.2)

## 2021-03-20 LAB — MRSA NEXT GEN BY PCR, NASAL: MRSA by PCR Next Gen: NOT DETECTED

## 2021-03-20 LAB — GLUCOSE, CAPILLARY
Glucose-Capillary: 267 mg/dL — ABNORMAL HIGH (ref 70–99)
Glucose-Capillary: 257 mg/dL — ABNORMAL HIGH (ref 70–99)
Glucose-Capillary: 349 mg/dL — ABNORMAL HIGH (ref 70–99)
Glucose-Capillary: 283 mg/dL — ABNORMAL HIGH (ref 70–99)
Glucose-Capillary: 276 mg/dL — ABNORMAL HIGH (ref 70–99)

## 2021-03-20 LAB — BRAIN NATRIURETIC PEPTIDE: B Natriuretic Peptide: 293.1 pg/mL — ABNORMAL HIGH (ref 0.0–100.0)

## 2021-03-20 MED ORDER — IPRATROPIUM-ALBUTEROL 0.5-2.5 (3) MG/3ML IN SOLN
3.0000 mL | Freq: Three times a day (TID) | RESPIRATORY_TRACT | Status: DC
  Administered 2021-03-21: 3 mL via RESPIRATORY_TRACT
  Filled 2021-03-20: qty 3

## 2021-03-20 MED ORDER — INSULIN ASPART 100 UNIT/ML IJ SOLN
5.0000 [IU] | Freq: Three times a day (TID) | INTRAMUSCULAR | Status: DC
  Administered 2021-03-20 – 2021-03-22 (×5): 5 [IU] via SUBCUTANEOUS

## 2021-03-20 MED ORDER — LIP MEDEX EX OINT
TOPICAL_OINTMENT | CUTANEOUS | Status: AC
  Filled 2021-03-20: qty 7

## 2021-03-20 MED ORDER — LIP MEDEX EX OINT: TOPICAL_OINTMENT | CUTANEOUS | Status: DC | PRN

## 2021-03-20 MED ORDER — QUETIAPINE FUMARATE 25 MG PO TABS
12.5000 mg | ORAL_TABLET | Freq: Every day | ORAL | Status: DC
  Administered 2021-03-20 – 2021-03-21 (×2): 12.5 mg via ORAL
  Filled 2021-03-20 (×2): qty 1

## 2021-03-20 MED ORDER — GABAPENTIN 400 MG PO CAPS
400.0000 mg | ORAL_CAPSULE | Freq: Three times a day (TID) | ORAL | Status: DC
  Administered 2021-03-20 – 2021-03-22 (×7): 400 mg via ORAL
  Filled 2021-03-20 (×6): qty 1

## 2021-03-20 MED ORDER — MELATONIN 3 MG PO TABS
3.0000 mg | ORAL_TABLET | Freq: Every day | ORAL | Status: DC
  Administered 2021-03-20 – 2021-03-21 (×2): 3 mg via ORAL
  Filled 2021-03-20 (×2): qty 1

## 2021-03-20 NOTE — Evaluation (Signed)
Occupational Therapy Evaluation Patient Details Name: Kayla [name] MRN: [id number] DOB: 1940/06/26 Today's Date: 03/20/2021   History of Present Illness 80 y.o. female who presented to [facility] ED on 11/15 with cough and shortness of breath.  Work-up revealed acute COPD exacerbation, acute hypoxic respiratory failure requiring 2 L O2 nasal cannula, RLE cellulitis. PMH significant for paroxysmal atrial fibrillation, HFpEF, COPD with chronic bronchitis, CKD stage IIIb, insulin-dependent T2DM, HTN, HLD, hypothyroidism, and OSA on CPAP.   Clinical Impression   PTA, pt was living with her husband who assisted pt with donning socks and shoes, in and out of tub, and IADLs. Pt using rollator for functional mobility. Pt currently requiring Supervision-Min A for LB ADLs and Supervision for functional mobility with RW. Providing education and handout on energy conservation for ADLs and IADLs; discussed ways to implement techniques into daily routine. SpO2 >90% on RA. Pt presenting with significant SOB and fatigue with mobility. Pt would benefit from further acute OT to facilitate safe dc. Recommend dc to home once medically stable per physician.       Recommendations for follow up therapy are one component of a multi-disciplinary discharge planning process, led by the attending physician.  Recommendations may be updated based on patient status, additional functional criteria and insurance authorization.   Follow Up Recommendations  No OT follow up    Assistance Recommended at Discharge Intermittent Supervision/Assistance  Functional Status Assessment  Patient has had a recent decline in their functional status and demonstrates the ability to make significant improvements in function in a reasonable and predictable amount of time.  Equipment Recommendations  None recommended by OT    Recommendations for Other Services       Precautions / Restrictions Precautions Precautions: Fall Precaution  Comments: 1 fall in past 6 months, has partially closed lacerations on RLE from that, tripped on a pillow Restrictions Weight Bearing Restrictions: No      Mobility Bed Mobility Overal bed mobility: Needs Assistance Bed Mobility: Sit to Supine       Sit to supine: Supervision   General bed mobility comments: Supervision for safety    Transfers Overall transfer level: Needs assistance Equipment used: Rolling walker (2 wheels) Transfers: Sit to/from Stand Sit to Stand: Supervision           General transfer comment: SUpervision for safety      Balance Overall balance assessment: History of Falls;Needs assistance   Sitting balance-Leahy Scale: Good       Standing balance-Leahy Scale: Fair                             ADL either performed or assessed with clinical judgement   ADL Overall ADL's : Needs assistance/impaired Eating/Feeding: Set up;Sitting   Grooming: Supervision/safety;Set up;Sitting   Upper Body Bathing: Supervision/ safety;Set up;Sitting   Lower Body Bathing: Supervison/ safety;Set up;Sit to/from stand   Upper Body Dressing : Supervision/safety;Set up;Sitting   Lower Body Dressing: Minimal assistance;Sit to/from stand   Toilet Transfer: Supervision/safety;Ambulation;Rolling walker (2 wheels) (simulated in room)           Functional mobility during ADLs: Supervision/safety;Rolling walker (2 wheels) General ADL Comments: Pt near baseline function. Demonstrating decreased activity tolerance (more fatigued than her normal). Providied education on [facility] and reviewed handout     Vision         Perception     Praxis      Pertinent Vitals/Pain Pain Assessment: Faces Pain Score:  6  Faces Pain Scale: Hurts little more Pain Location: low back (chronic) Pain Descriptors / Indicators: Aching Pain Intervention(s): Monitored during session;Repositioned     Hand Dominance Right   Extremity/Trunk Assessment Upper Extremity  Assessment Upper Extremity Assessment: Overall WFL for tasks assessed   Lower Extremity Assessment Lower Extremity Assessment: Defer to PT evaluation RLE Deficits / Details: knee ext -4/5 RLE Sensation: history of peripheral neuropathy;decreased light touch LLE Deficits / Details: knee ext +4/5 LLE Sensation: history of peripheral neuropathy;decreased light touch   Cervical / Trunk Assessment Cervical / Trunk Assessment: Other exceptions Cervical / Trunk Exceptions: Increased body habitus   Communication Communication Communication: No difficulties   Cognition Arousal/Alertness: Awake/alert Behavior During Therapy: WFL for tasks assessed/performed Overall Cognitive Status: Within Functional Limits for tasks assessed                                       General Comments  Husband present throughout. SpO2 >91% on RA    Exercises     Shoulder Instructions      Home Living Family/patient expects to be discharged to:: Private residence Living Arrangements: Spouse/significant other Available Help at Discharge: Family;Available 24 hours/day Type of Home: House Home Access: Stairs to enter   Entrance Stairs-Rails: Right Home Layout: One level     Bathroom Shower/Tub: Teacher, early years/pre: Standard     Home Equipment: Rollator (4 wheels);Rolling Walker (2 wheels);Cane - single point;Shower seat          Prior Functioning/Environment Prior Level of Function : Needs assist       Physical Assist : ADLs (physical)   ADLs (physical): Bathing;Dressing;IADLs Mobility Comments: Uses a rollator for mobility ADLs Comments: Husband assists with donning socks and shoes as well as tub transfers. husband performs majority of IADLs        OT Problem List: Decreased strength;Decreased range of motion;Decreased activity tolerance;Impaired balance (sitting and/or standing);Decreased knowledge of precautions;Decreased knowledge of use of DME or AE       OT Treatment/Interventions: Self-care/ADL training;Therapeutic exercise;Energy conservation;DME and/or AE instruction;Patient/family education    OT Goals(Current goals can be found in the care plan section) Acute Rehab OT Goals Patient Stated Goal: Go home OT Goal Formulation: With patient Time For Goal Achievement: 04/03/21 Potential to Achieve Goals: Good  OT Frequency: Min 2X/week   Barriers to D/C:            Co-evaluation              AM-PAC OT "6 Clicks" Daily Activity     Outcome Measure Help from another person eating meals?: None Help from another person taking care of personal grooming?: A Little Help from another person toileting, which includes using toliet, bedpan, or urinal?: A Little Help from another person bathing (including washing, rinsing, drying)?: A Little Help from another person to put on and taking off regular upper body clothing?: A Little Help from another person to put on and taking off regular lower body clothing?: A Little 6 Click Score: 19   End of Session Equipment Utilized During Treatment: Rolling walker (2 wheels) Nurse Communication: Mobility status  Activity Tolerance: Patient tolerated treatment well Patient left: in bed;with call bell/phone within reach;with family/visitor present;with nursing/sitter in room  OT Visit Diagnosis: Unsteadiness on feet (R26.81);Other abnormalities of gait and mobility (R26.89);Muscle weakness (generalized) (M62.81)  Time: 8206-0156 OT Time Calculation (min): 29 min Charges:  OT General Charges $OT Visit: 1 Visit OT Evaluation $OT Eval Low Complexity: 1 Low OT Treatments $Self Care/Home Management : 8-22 mins  Baer Hinton MSOT, OTR/L Acute Rehab Pager: [phone] Office: [place] 03/20/2021, 3:18 PM

## 2021-03-20 NOTE — Progress Notes (Deleted)
Occupational Therapy Treatment Patient Details Name: Kayla [name] MRN: [id number] DOB: 02-Sep-1940 Today's Date: 03/20/2021   History of present illness 80 y.o. female who presented to [facility] ED on 11/15 with cough and shortness of breath.  Work-up revealed acute COPD exacerbation, acute hypoxic respiratory failure requiring 2 L O2 nasal cannula, RLE cellulitis. PMH significant for paroxysmal atrial fibrillation, HFpEF, COPD with chronic bronchitis, CKD stage IIIb, insulin-dependent T2DM, HTN, HLD, hypothyroidism, and OSA on CPAP.   OT comments  PTA, pt was living with her husband who assisted pt with donning socks and shoes, in and out of tub, and IADLs. Pt using rollator for functional mobility. Pt currently requiring Supervision-Min A for LB ADLs and Supervision for functional mobility with RW. Providing education and handout on energy conservation for ADLs and IADLs; discussed ways to implement techniques into daily routine. SpO2 >90% on RA. Pt presenting with significant SOB and fatigue with mobility. Pt would benefit from further acute OT to facilitate safe dc. Recommend dc to home once medically stable per physician.    Recommendations for follow up therapy are one component of a multi-disciplinary discharge planning process, led by the attending physician.  Recommendations may be updated based on patient status, additional functional criteria and insurance authorization.    Follow Up Recommendations  No OT follow up    Assistance Recommended at Discharge Intermittent Supervision/Assistance  Equipment Recommendations  None recommended by OT    Recommendations for Other Services      Precautions / Restrictions Precautions Precautions: Fall Precaution Comments: 1 fall in past 6 months, has partially closed lacerations on RLE from that, tripped on a pillow Restrictions Weight Bearing Restrictions: No       Mobility Bed Mobility Overal bed mobility: Needs Assistance Bed  Mobility: Sit to Supine       Sit to supine: Supervision   General bed mobility comments: Supervision for safety    Transfers Overall transfer level: Needs assistance Equipment used: Rolling walker (2 wheels) Transfers: Sit to/from Stand Sit to Stand: Supervision           General transfer comment: SUpervision for safety     Balance Overall balance assessment: History of Falls;Needs assistance   Sitting balance-Leahy Scale: Good       Standing balance-Leahy Scale: Fair                             ADL either performed or assessed with clinical judgement   ADL Overall ADL's : Needs assistance/impaired Eating/Feeding: Set up;Sitting   Grooming: Supervision/safety;Set up;Sitting   Upper Body Bathing: Supervision/ safety;Set up;Sitting   Lower Body Bathing: Supervison/ safety;Set up;Sit to/from stand   Upper Body Dressing : Supervision/safety;Set up;Sitting   Lower Body Dressing: Minimal assistance;Sit to/from stand   Toilet Transfer: Supervision/safety;Ambulation;Rolling walker (2 wheels) (simulated in room)           Functional mobility during ADLs: Supervision/safety;Rolling walker (2 wheels) General ADL Comments: Pt near baseline function. Demonstrating decreased activity tolerance (more fatigued than her normal). Providied education on EC and reviewed handout    Extremity/Trunk Assessment Upper Extremity Assessment Upper Extremity Assessment: Overall WFL for tasks assessed   Lower Extremity Assessment Lower Extremity Assessment: Defer to PT evaluation RLE Deficits / Details: knee ext -4/5 RLE Sensation: history of peripheral neuropathy;decreased light touch LLE Deficits / Details: knee ext +4/5 LLE Sensation: history of peripheral neuropathy;decreased light touch   Cervical / Trunk Assessment Cervical / Trunk Assessment:  Other exceptions Cervical / Trunk Exceptions: Increased body habitus    Vision       Perception     Praxis       Cognition Arousal/Alertness: Awake/alert Behavior During Therapy: WFL for tasks assessed/performed Overall Cognitive Status: Within Functional Limits for tasks assessed                                            Exercises     Shoulder Instructions       General Comments Husband present throughout. SpO2 >91% on RA    Pertinent Vitals/ Pain       Pain Assessment: Faces Pain Score: 6  Faces Pain Scale: Hurts little more Pain Location: low back (chronic) Pain Descriptors / Indicators: Aching Pain Intervention(s): Monitored during session;Repositioned  Home Living Family/patient expects to be discharged to:: Private residence Living Arrangements: Spouse/significant other Available Help at Discharge: Family;Available 24 hours/day Type of Home: House Home Access: Stairs to enter   Entrance Stairs-Rails: Right Home Layout: One level     Bathroom Shower/Tub: Teacher, early years/pre: Standard     Home Equipment: Rollator (4 wheels);Rolling Walker (2 wheels);Cane - single point;Shower seat          Prior Functioning/Environment              Frequency  Min 2X/week        Progress Toward Goals  OT Goals(current goals can now be found in the care plan section)     Acute Rehab OT Goals Patient Stated Goal: Go home OT Goal Formulation: With patient Time For Goal Achievement: 04/03/21 Potential to Achieve Goals: Good  Plan      Co-evaluation                 AM-PAC OT "6 Clicks" Daily Activity     Outcome Measure   Help from another person eating meals?: None Help from another person taking care of personal grooming?: A Little Help from another person toileting, which includes using toliet, bedpan, or urinal?: A Little Help from another person bathing (including washing, rinsing, drying)?: A Little Help from another person to put on and taking off regular upper body clothing?: A Little Help from another person to put  on and taking off regular lower body clothing?: A Little 6 Click Score: 19    End of Session Equipment Utilized During Treatment: Rolling walker (2 wheels)  OT Visit Diagnosis: Unsteadiness on feet (R26.81);Other abnormalities of gait and mobility (R26.89);Muscle weakness (generalized) (M62.81)   Activity Tolerance Patient tolerated treatment well   Patient Left in bed;with call bell/phone within reach;with family/visitor present;with nursing/sitter in room   Nurse Communication Mobility status        Time: 4008-6761 OT Time Calculation (min): 29 min  Charges: OT General Charges $OT Visit: 1 Visit OT Evaluation $OT Eval Low Complexity: 1 Low OT Treatments $Self Care/Home Management : 8-22 mins  [place], OTR/L Acute Rehab Pager: [phone] Office: [place] 03/20/2021, 1:20 PM

## 2021-03-20 NOTE — Evaluation (Signed)
Physical Therapy Evaluation Patient Details Name: CHEYENNE BORDEAUX MRN: [id number] DOB: 1940/12/13 Today's Date: 03/20/2021  History of Present Illness  80 y.o. female who presented to [facility] ED for evaluation of cough and worsening shortness of breath.  Work-up revealed acute COPD exacerbation, acute hypoxic respiratory failure requiring 2 L O2 nasal cannula, RLE cellulitis. Pt  with medical history significant for paroxysmal atrial fibrillation on Eliquis and amiodarone, HFpEF (last EF 60-65%, G2 DD by TTE 10/03/2020), COPD with chronic bronchitis, CKD stage IIIb, insulin-dependent T2DM, HTN, HLD, hypothyroidism, and OSA on CPAP.  Clinical Impression  Pt admitted with above diagnosis. Pt ambulated 49' with RW, SaO2 91% on room air, distance limited by 3/4 dyspnea. She would benefit from HHPT to address endurance and balance deficits.  Pt currently with functional limitations due to the deficits listed below (see PT Problem List). Pt will benefit from skilled PT to increase their independence and safety with mobility to allow discharge to the venue listed below.          Recommendations for follow up therapy are one component of a multi-disciplinary discharge planning process, led by the attending physician.  Recommendations may be updated based on patient status, additional functional criteria and insurance authorization.  Follow Up Recommendations Home health PT    Assistance Recommended at Discharge Intermittent Supervision/Assistance  Functional Status Assessment Patient has had a recent decline in their functional status and demonstrates the ability to make significant improvements in function in a reasonable and predictable amount of time.  Equipment Recommendations  None recommended by PT    Recommendations for Other Services       Precautions / Restrictions Precautions Precautions: Fall Precaution Comments: 1 fall in past 6 months, has partially closed lacerations on RLE from that,  tripped on a pillow Restrictions Weight Bearing Restrictions: No      Mobility  Bed Mobility               General bed mobility comments: sitting up at edge of bed    Transfers Overall transfer level: Needs assistance Equipment used: Rolling walker (2 wheels) Transfers: Sit to/from Stand Sit to Stand: Supervision           General transfer comment: supervision for safety, mild trunk sway but no loss of balance    Ambulation/Gait Ambulation/Gait assistance: Min guard Gait Distance (Feet): 60 Feet Assistive device: Rolling walker (2 wheels) Gait Pattern/deviations: Step-through pattern;Decreased stride length Gait velocity: decr     General Gait Details: no overt loss of balance, mild trunk sway when taking a few steps backing up to the bed, SaO2 91% on room air walking, distance limited by 3/4 dyspnea  Stairs            Wheelchair Mobility    Modified Rankin (Stroke Patients Only)       Balance Overall balance assessment: History of Falls;Needs assistance   Sitting balance-Leahy Scale: Good       Standing balance-Leahy Scale: Fair                               Pertinent Vitals/Pain Pain Assessment: 0-10 Pain Score: 6  Pain Location: low back (chronic) Pain Descriptors / Indicators: Aching Pain Intervention(s): Limited activity within patient's tolerance;Monitored during session;Premedicated before session    Home Living Family/patient expects to be discharged to:: Private residence Living Arrangements: Spouse/significant other Available Help at Discharge: Family;Available 24 hours/day Type of Home: House Home Access:  Stairs to enter Entrance Stairs-Rails: Right     Home Layout: One level Home Equipment: Rollator (4 wheels);Rolling Walker (2 wheels);Cane - single point;Tub bench      Prior Function Prior Level of Function : Needs assist       Physical Assist : ADLs (physical)   ADLs (physical): Bathing Mobility  Comments: independent with rollator, 1 fall in past 6 months ADLs Comments: assist for in/out of shower     Hand Dominance        Extremity/Trunk Assessment   Upper Extremity Assessment Upper Extremity Assessment: Defer to OT evaluation    Lower Extremity Assessment Lower Extremity Assessment: LLE deficits/detail;RLE deficits/detail RLE Deficits / Details: knee ext -4/5 RLE Sensation: history of peripheral neuropathy;decreased light touch LLE Deficits / Details: knee ext +4/5 LLE Sensation: history of peripheral neuropathy;decreased light touch    Cervical / Trunk Assessment Cervical / Trunk Assessment: Normal  Communication   Communication: No difficulties  Cognition Arousal/Alertness: Awake/alert Behavior During Therapy: WFL for tasks assessed/performed Overall Cognitive Status: Within Functional Limits for tasks assessed                                          General Comments      Exercises     Assessment/Plan    PT Assessment Patient needs continued PT services  PT Problem List Cardiopulmonary status limiting activity;Decreased activity tolerance;Decreased balance;Impaired sensation       PT Treatment Interventions Gait training;Functional mobility training;Therapeutic activities;Therapeutic exercise;Patient/family education;Balance training    PT Goals (Current goals can be found in the Care Plan section)  Acute Rehab PT Goals Patient Stated Goal: [place] the [place] PT Goal Formulation: With patient/family Time For Goal Achievement: 04/03/21 Potential to Achieve Goals: Good    Frequency Min 3X/week   Barriers to discharge        Co-evaluation               AM-PAC PT "6 Clicks" Mobility  Outcome Measure Help needed turning from your back to your side while in a flat bed without using bedrails?: A Little Help needed moving from lying on your back to sitting on the side of a flat bed without using bedrails?: A  Little Help needed moving to and from a bed to a chair (including a wheelchair)?: None Help needed standing up from a chair using your arms (e.g., wheelchair or bedside chair)?: None Help needed to walk in hospital room?: A Little Help needed climbing 3-5 steps with a railing? : A Little 6 Click Score: 20    End of Session Equipment Utilized During Treatment: Gait belt Activity Tolerance: Patient limited by fatigue Patient left: in bed;with family/visitor present Nurse Communication: Mobility status PT Visit Diagnosis: Unsteadiness on feet (R26.81);History of falling (Z91.81);Difficulty in walking, not elsewhere classified (R26.2)    Time: 1202-1220 PT Time Calculation (min) (ACUTE ONLY): 18 min   Charges:   PT Evaluation $PT Eval Moderate Complexity: 1 Mod         Philomena Doheny PT 03/20/2021  Acute Rehabilitation Services Pager [phone] Office [phone]

## 2021-03-20 NOTE — Progress Notes (Signed)
PROGRESS NOTE  Kayla [name] OBS:[id number] DOB: 1940/10/21 DOA: 03/18/2021 PCP: [name], MD  HPI/Recap of past 24 hours: Kayla [name] is a 80 y.o. female with medical history significant for paroxysmal atrial fibrillation on Eliquis and amiodarone, HFpEF (last EF 60-65%, G2 DD by TTE 10/03/2020), COPD with chronic bronchitis, CKD stage IIIb, insulin-dependent T2DM, HTN, HLD, hypothyroidism, and OSA on CPAP who presented to [facility] ED for evaluation of cough and worsening shortness of breath.  Work-up revealed acute COPD exacerbation, bronchitis, and acute hypoxic respiratory failure requiring 2 L O2 nasal cannula, for which she was started on azithromycin and IV Solu-Medrol.  Patient endorsed right lower extremity pain and tenderness on palpation.  On exam her right lower extremity appears edematous, erythematous, warm, and tender to palpation.  She was started on empiric IV antibiotics cefazolin for right lower extremity cellulitis.  She reports she went through 2 rounds of oral antibiotics prior to presentation, prescribed by her primary care provider.  MRSA screen negative.  Hospital course complicated by AKI likely prerenal in the setting of home oral diuretics.  Home Lasix held on 03/20/2021.    03/20/2021: Seen and examined at her bedside.  Her husband was present in the room.  Last night she had some confusion and agitation likely delirium precipitated by her acute illnesses.  Assessment/Plan: Principal Problem:   COPD with acute exacerbation (HCC) Active Problems:   HYPOTHYROIDISM, POSTSURGICAL   Insulin dependent type 2 diabetes mellitus ([place])   Hyperlipidemia associated with type 2 diabetes mellitus ([place])   OBSTRUCTIVE SLEEP APNEA   Hypertension associated with diabetes ([place])   Acute respiratory failure with hypoxia (HCC)   Paroxysmal A-fib (HCC)   Cellulitis of right lower extremity   CKD (chronic kidney disease), stage III (HCC)   COPD with exacerbation (HCC)  Acute  COPD exacerbation with bronchitis  Presented with worsening shortness of breath, a productive cough, and wheezing. Personally reviewed 2 view chest x-ray done on admission which showed central airway thickening suspicious for bronchitis. She was started on azithromycin and IV Solu-Medrol 40 mg twice daily, continue Continue pulmonary toilet, Mucinex 1200 mg twice daily x3 days, saline nebs twice daily x3 days. Continue bronchodilators Continue incentive spirometer Continue to mobilize as tolerated.  Acute metabolic encephalopathy/delirium in the setting of her underlying acute illnesses. Episode of delirium overnight Regulate sleep cycle with nonpharmacological intervention for delirium Reorient often, open blinds during the day, out of bed to chair during the day. Seroquel nightly and melatonin for sleep.  If melatonin not effective, consider trazodone at the lowest dose. Continue fall precautions and delirium precautions.  AKI on CKD 3B, suspect prerenal in the setting of intravascular volume depletion Home diuretics held on 03/20/2021 Baseline creatinine appears to be 1.4 with GFR of 36. Creatinine uptrending 1.86 with GFR 27 Closely monitor urine output with strict I's and O's Repeat BMP in the morning  Acute hypoxic respiratory failure secondary to COPD exacerbation Not on oxygen supplementation at baseline Currently requiring 2 L to maintain O2 saturation greater than 88 % O2 saturation 88% on room air. Obtain home oxygen evaluation in the morning  Right lower extremity cellulitis, POA Failed outpatient antibiotics therapy x2. MRSA screening test negative. Continue IV Ancef for now. Continue to follow blood cultures x2 peripherally, no growth to date..  Type 2 diabetes with hyperglycemia likely exacerbated by IV steroids Last hemoglobin A1c 8.4 on 01/30/2021 Continue long-acting insulin and insulin sliding scale. Added NovoLog 5 units 3 times daily  due to  hyperglycemia. Continue to closely monitor CBGs  Anemia of chronic disease Hemoglobin is stable and uptrending 10.9 from 10.0. No overt bleeding  Paroxysmal A. fib on Eliquis Continue home regimen, along with amiodarone. Continue Eliquis for CVA prevention Continue to closely monitor on telemetry  Diabetic polyneuropathy Continue home regimen, on gabapentin 800 mg 3 times daily.  Hypertension Blood pressure stable Continue home regimen Continue to monitor vital signs.  Hyperlipidemia Continue home Lipitor.  Physical debility PT OT assessed and recommended home PT Mobilize as tolerated Continue fall precautions   Code Status: DNR  Family Communication: Husband at bedside  Disposition Plan: Likely will discharge to home once cellulitis has improved.   Consultants: Cardiology consult on 03/21/2021 for management of her cardiac medications.  Procedures: None.  Antimicrobials: Azithromycin Ancef.  DVT prophylaxis: Eliquis  Status is: Inpatient  Inpatient status.  Patient will require least 2 midnights for further evaluation and treatment of present condition.      Objective: Vitals:   03/20/21 0746 03/20/21 1002 03/20/21 1230 03/20/21 1310  BP:  (!) 153/54  (!) 148/50  Pulse:  67  77  Resp:  18  20  Temp:    98.4 F (36.9 C)  TempSrc:    Oral  SpO2: 92% 94% 91% 95%  Weight:      Height:        Intake/Output Summary (Last 24 hours) at 03/20/2021 1724 Last data filed at 03/20/2021 1000 Gross per 24 hour  Intake 483 ml  Output --  Net 483 ml   Filed Weights   03/19/21 1831  Weight: 110.8 kg    Exam:  General: 80 y.o. year-old female Well developed well-nourished no acute distress.  She is alert and oriented x3.   Cardiovascular: Irregular rate and rhythm no rubs or gallops.  No JVD or thyromegaly noted. Respiratory: Mild rales at bases with mild wheezing noted.  Good inspiratory effort. Abdomen: Soft nontender normal bowel sounds  present.  Musculoskeletal: Trace lower extremity edema bilaterally.   Skin: Right lower extremity, tender with palpation, improved erythema, warmth and edema. Psychiatry: Mood is appropriate for condition and setting.  Neuro: Moves all 4 extremities.  Data Reviewed: CBC: Recent Labs  Lab 03/18/21 1333 03/19/21 0500 03/20/21 0815  WBC 15.8* 15.7* 15.8*  NEUTROABS 12.8*  --   --   HGB 10.8* 10.0* 10.9*  HCT 34.9* 31.5* 34.9*  MCV 94.6 92.1 92.3  PLT 388 362 629*   Basic Metabolic Panel: Recent Labs  Lab 03/18/21 1333 03/19/21 0500 03/20/21 0815  NA 135 138 137  K 4.9 4.6 4.2  CL 98 101 99  CO2 26 27 25   GLUCOSE 279* 183* 270*  BUN 38* 37* 57*  CREATININE 1.48* 1.61* 1.86*  CALCIUM 9.6 9.1 8.9   GFR: Estimated Creatinine Clearance: 30.4 mL/min (A) (by C-G formula based on SCr of 1.86 mg/dL (H)). Liver Function Tests: No results for input(s): AST, ALT, ALKPHOS, BILITOT, PROT, ALBUMIN in the last 168 hours. No results for input(s): LIPASE, AMYLASE in the last 168 hours. No results for input(s): AMMONIA in the last 168 hours. Coagulation Profile: No results for input(s): INR, PROTIME in the last 168 hours. Cardiac Enzymes: No results for input(s): CKTOTAL, CKMB, CKMBINDEX, TROPONINI in the last 168 hours. BNP (last 3 results) Recent Labs    09/24/20 1128  PROBNP 562   HbA1C: No results for input(s): HGBA1C in the last 72 hours. CBG: Recent Labs  Lab 03/19/21 2131 03/20/21 0201 03/20/21  9735 03/20/21 1120 03/20/21 1623  GLUCAP 404* 267* 257* 349* 283*   Lipid Profile: No results for input(s): CHOL, HDL, LDLCALC, TRIG, CHOLHDL, LDLDIRECT in the last 72 hours. Thyroid Function Tests: No results for input(s): TSH, T4TOTAL, FREET4, T3FREE, THYROIDAB in the last 72 hours. Anemia Panel: No results for input(s): VITAMINB12, FOLATE, FERRITIN, TIBC, IRON, RETICCTPCT in the last 72 hours. Urine analysis:    Component Value Date/Time   COLORURINE YELLOW  08/15/2019 2257   APPEARANCEUR HAZY (A) 08/15/2019 2257   LABSPEC 1.014 08/15/2019 2257   PHURINE 5.0 08/15/2019 2257   GLUCOSEU NEGATIVE 08/15/2019 2257   GLUCOSEU NEGATIVE 12/09/2018 0944   HGBUR NEGATIVE 08/15/2019 2257   BILIRUBINUR NEGATIVE 08/15/2019 2257   KETONESUR NEGATIVE 08/15/2019 2257   PROTEINUR 100 (A) 08/15/2019 2257   UROBILINOGEN 0.2 12/09/2018 0944   NITRITE NEGATIVE 08/15/2019 2257   LEUKOCYTESUR MODERATE (A) 08/15/2019 2257   Sepsis Labs: @LABRCNTIP (procalcitonin:4,lacticidven:4)  ) Recent Results (from the past 240 hour(s))  Resp Panel by RT-PCR (Flu A&B, Covid) Nasopharyngeal Swab     Status: None   Collection Time: 03/18/21  1:33 PM   Specimen: Nasopharyngeal Swab; Nasopharyngeal(NP) swabs in vial transport medium  Result Value Ref Range Status   SARS Coronavirus 2 by RT PCR NEGATIVE NEGATIVE Final    Comment: (NOTE) SARS-CoV-2 target nucleic acids are NOT DETECTED.  The SARS-CoV-2 RNA is generally detectable in upper respiratory specimens during the acute phase of infection. The lowest concentration of SARS-CoV-2 viral copies this assay can detect is 138 copies/mL. A negative result does not preclude SARS-Cov-2 infection and should not be used as the sole basis for treatment or other patient management decisions. A negative result may occur with  improper specimen collection/handling, submission of specimen other than nasopharyngeal swab, presence of viral mutation(s) within the areas targeted by this assay, and inadequate number of viral copies(<138 copies/mL). A negative result must be combined with clinical observations, patient history, and epidemiological information. The expected result is Negative.  Fact Sheet for Patients:  EntrepreneurPulse.com.au  Fact Sheet for Healthcare Providers:  IncredibleEmployment.be  This test is no t yet approved or cleared by the [place] FDA and  has been authorized  for detection and/or diagnosis of SARS-CoV-2 by FDA under an Emergency Use Authorization (EUA). This EUA will remain  in effect (meaning this test can be used) for the duration of the COVID-19 declaration under Section 564(b)(1) of the Act, 21 U.S.C.section 360bbb-3(b)(1), unless the authorization is terminated  or revoked sooner.       Influenza A by PCR NEGATIVE NEGATIVE Final   Influenza B by PCR NEGATIVE NEGATIVE Final    Comment: (NOTE) The Xpert Xpress SARS-CoV-2/FLU/RSV plus assay is intended as an aid in the diagnosis of influenza from Nasopharyngeal swab specimens and should not be used as a sole basis for treatment. Nasal washings and aspirates are unacceptable for Xpert Xpress SARS-CoV-2/FLU/RSV testing.  Fact Sheet for Patients: EntrepreneurPulse.com.au  Fact Sheet for Healthcare Providers: IncredibleEmployment.be  This test is not yet approved or cleared by the [place] FDA and has been authorized for detection and/or diagnosis of SARS-CoV-2 by FDA under an Emergency Use Authorization (EUA). This EUA will remain in effect (meaning this test can be used) for the duration of the COVID-19 declaration under Section 564(b)(1) of the Act, 21 U.S.C. section 360bbb-3(b)(1), unless the authorization is terminated or revoked.  Performed at [facility] [place] [address] [place], [place] [address]   Culture, blood (routine x  2)     Status: None (Preliminary result)   Collection Time: 03/19/21  4:41 PM   Specimen: Right Antecubital; Blood  Result Value Ref Range Status   Specimen Description   Final    RIGHT ANTECUBITAL Performed at [facility] [address], [place], [place] [address]    Special Requests   Final    BOTTLES DRAWN AEROBIC ONLY Blood Culture adequate volume Performed at [facility] [address], [place], [place] [address]    Culture   Final    NO  GROWTH < 24 HOURS Performed at [facility] [address] [place], [place] [address]    Report Status PENDING  Incomplete  Culture, blood (routine x 2)     Status: None (Preliminary result)   Collection Time: 03/19/21  4:41 PM   Specimen: BLOOD RIGHT HAND  Result Value Ref Range Status   Specimen Description   Final    BLOOD RIGHT HAND Performed at [facility] [address] [place], [place] [address]    Special Requests   Final    BOTTLES DRAWN AEROBIC ONLY Blood Culture adequate volume Performed at [facility] [address], [place], [place] [address]    Culture   Final    NO GROWTH < 24 HOURS Performed at [facility] [address], [place], [place] [address]    Report Status PENDING  Incomplete  MRSA Next Gen by PCR, Nasal     Status: None   Collection Time: 03/20/21  1:00 AM   Specimen: Nasal Mucosa; Nasal Swab  Result Value Ref Range Status   MRSA by PCR Next Gen NOT DETECTED NOT DETECTED Final    Comment: (NOTE) The GeneXpert MRSA Assay (FDA approved for NASAL specimens only), is one component of a comprehensive MRSA colonization surveillance program. It is not intended to diagnose MRSA infection nor to guide or monitor treatment for MRSA infections. Test performance is not FDA approved in patients less than 75 years old. Performed at [facility], [place] [address], [place],  [address]       Studies: No results found.  Scheduled Meds:  allopurinol  100 mg Oral BID   amiodarone  200 mg Oral Daily   amLODipine  5 mg Oral Daily   apixaban  5 mg Oral BID   atorvastatin  40 mg Oral Daily   azithromycin  250 mg Oral Daily   fluticasone furoate-vilanterol  1 puff Inhalation Daily   gabapentin  400 mg Oral TID   guaiFENesin  1,200 mg Oral BID   insulin aspart  0-20 Units Subcutaneous TID WC   insulin aspart  0-5 Units Subcutaneous QHS   insulin aspart  5 Units Subcutaneous TID WC    insulin glargine-yfgn  80 Units Subcutaneous BID   ipratropium-albuterol  3 mL Nebulization Q6H   levothyroxine  224 mcg Oral Q0600   lip balm       losartan  50 mg Oral Daily   melatonin  3 mg Oral QHS   methylPREDNISolone (SOLU-MEDROL) injection  40 mg Intravenous Q12H   metoprolol succinate  100 mg Oral Daily   QUEtiapine  12.5 mg Oral QHS   sodium chloride flush  3 mL Intravenous Q12H   sodium chloride HYPERTONIC  4 mL Nebulization BID   umeclidinium bromide  1 puff Inhalation Daily    Continuous Infusions:   ceFAZolin (ANCEF) IV 2 g (03/20/21 1540)  LOS: 1 day     Kayleen Memos, MD [facility] Pager [phone]  If 7PM-7AM, please contact night-coverage [email or web address] Password [facility] 03/20/2021, 5:24 PM

## 2021-03-20 NOTE — Progress Notes (Signed)
Inpatient Diabetes Program Recommendations  AACE/ADA: New Consensus Statement on Inpatient Glycemic Control (2015)  Target Ranges:  Prepandial:   less than 140 mg/dL      Peak postprandial:   less than 180 mg/dL (1-2 hours)      Critically ill patients:  140 - 180 mg/dL   Lab Results  Component Value Date   GLUCAP 349 (H) 03/20/2021   HGBA1C 8.4 (H) 01/30/2021    Review of Glycemic Control  Latest Reference Range & Units 02/01/21 11:32 03/19/21 07:48 03/19/21 12:16 03/19/21 17:19 03/19/21 21:31 03/20/21 02:01 03/20/21 07:31  Glucose-Capillary 70 - 99 mg/dL 356 (H) 254 (H) 397 (H) 430 (H) 404 (H) 267 (H) 257 (H)   Diabetes history: DM2 Outpatient Diabetes medications: Lantus 110 units BID, Novolog 5-15 untis TID Current orders for Inpatient glycemic control: Novolog 0-9 units TID, Semglee 80 units BID. Solumedrol 40 mg BID  Inpatient Diabetes Program Recommendations:   -Add Novolog 5 units tid meal coverage if eats 50% Secure chat sent to Dr. Irene Pap.  Thank you, Nani Gasser. Yarelie Hams, RN, MSN, CDE  Diabetes Coordinator Inpatient Glycemic Control Team Team Pager [phone] (8am-5pm) 03/20/2021 1:37 PM

## 2021-03-20 NOTE — Progress Notes (Signed)
Patient is very agitated and confused this morning. She has refused vital signs. Tech was able to obtain oxygen level at 97% RA. Physician notified via secure chat. Patient is now sitting in chair with walker in front of her. Patient tried to take out IV, but this nurse is in room and stopped her from removing it. Will continue to monitor patient.

## 2021-03-21 DIAGNOSIS — I480 Paroxysmal atrial fibrillation: Secondary | ICD-10-CM

## 2021-03-21 DIAGNOSIS — J441 Chronic obstructive pulmonary disease with (acute) exacerbation: Secondary | ICD-10-CM | POA: Diagnosis not present

## 2021-03-21 DIAGNOSIS — R079 Chest pain, unspecified: Secondary | ICD-10-CM

## 2021-03-21 DIAGNOSIS — I5032 Chronic diastolic (congestive) heart failure: Secondary | ICD-10-CM

## 2021-03-21 DIAGNOSIS — R0609 Other forms of dyspnea: Secondary | ICD-10-CM

## 2021-03-21 LAB — CBC
HCT: 32.1 % — ABNORMAL LOW (ref 36.0–46.0)
Hemoglobin: 10 g/dL — ABNORMAL LOW (ref 12.0–15.0)
MCH: 28.7 pg (ref 26.0–34.0)
MCHC: 31.2 g/dL (ref 30.0–36.0)
MCV: 92 fL (ref 80.0–100.0)
Platelets: 438 10*3/uL — ABNORMAL HIGH (ref 150–400)
RBC: 3.49 MIL/uL — ABNORMAL LOW (ref 3.87–5.11)
RDW: 16.1 % — ABNORMAL HIGH (ref 11.5–15.5)
WBC: 15.5 10*3/uL — ABNORMAL HIGH (ref 4.0–10.5)
nRBC: 0.1 % (ref 0.0–0.2)

## 2021-03-21 LAB — BASIC METABOLIC PANEL
Anion gap: 10 (ref 5–15)
BUN: 67 mg/dL — ABNORMAL HIGH (ref 8–23)
CO2: 26 mmol/L (ref 22–32)
Calcium: 8.7 mg/dL — ABNORMAL LOW (ref 8.9–10.3)
Chloride: 102 mmol/L (ref 98–111)
Creatinine, Ser: 1.89 mg/dL — ABNORMAL HIGH (ref 0.44–1.00)
GFR, Estimated: 27 mL/min — ABNORMAL LOW (ref >60)
Glucose, Bld: 194 mg/dL — ABNORMAL HIGH (ref 70–99)
Potassium: 3.9 mmol/L (ref 3.5–5.1)
Sodium: 138 mmol/L (ref 135–145)

## 2021-03-21 LAB — GLUCOSE, CAPILLARY
Glucose-Capillary: 207 mg/dL — ABNORMAL HIGH (ref 70–99)
Glucose-Capillary: 335 mg/dL — ABNORMAL HIGH (ref 70–99)
Glucose-Capillary: 209 mg/dL — ABNORMAL HIGH (ref 70–99)
Glucose-Capillary: 394 mg/dL — ABNORMAL HIGH (ref 70–99)

## 2021-03-21 MED ORDER — IPRATROPIUM-ALBUTEROL 0.5-2.5 (3) MG/3ML IN SOLN
3.0000 mL | RESPIRATORY_TRACT | Status: AC
  Administered 2021-03-21: 3 mL via RESPIRATORY_TRACT
  Filled 2021-03-21: qty 3

## 2021-03-21 MED ORDER — BISACODYL 10 MG RE SUPP: 10.0000 mg | Freq: Every day | RECTAL | Status: DC | PRN

## 2021-03-21 MED ORDER — SENNOSIDES-DOCUSATE SODIUM 8.6-50 MG PO TABS
2.0000 | ORAL_TABLET | Freq: Two times a day (BID) | ORAL | Status: DC
  Administered 2021-03-21 – 2021-03-22 (×2): 2 via ORAL
  Filled 2021-03-21 (×2): qty 2

## 2021-03-21 MED ORDER — HYDROCOD POLST-CPM POLST ER 10-8 MG/5ML PO SUER
5.0000 mL | Freq: Once | ORAL | Status: AC
  Administered 2021-03-21: 5 mL via ORAL
  Filled 2021-03-21: qty 5

## 2021-03-21 MED ORDER — GUAIFENESIN-CODEINE 100-10 MG/5ML PO SOLN: 10.0000 mL | Freq: Once | ORAL | Status: DC

## 2021-03-21 MED ORDER — IPRATROPIUM-ALBUTEROL 0.5-2.5 (3) MG/3ML IN SOLN
3.0000 mL | Freq: Two times a day (BID) | RESPIRATORY_TRACT | Status: DC
  Administered 2021-03-22: 3 mL via RESPIRATORY_TRACT
  Filled 2021-03-21: qty 3

## 2021-03-21 MED ORDER — MUPIROCIN CALCIUM 2 % EX CREA
TOPICAL_CREAM | Freq: Every day | CUTANEOUS | Status: DC
  Filled 2021-03-21: qty 15

## 2021-03-21 MED ORDER — TORSEMIDE 20 MG PO TABS
80.0000 mg | ORAL_TABLET | Freq: Every day | ORAL | Status: DC
  Administered 2021-03-21 – 2021-03-22 (×2): 80 mg via ORAL
  Filled 2021-03-21 (×2): qty 4
  Filled 2021-03-21: qty 3

## 2021-03-21 MED ORDER — POLYETHYLENE GLYCOL 3350 17 G PO PACK: 17.0000 g | PACK | Freq: Every day | ORAL | Status: DC | PRN

## 2021-03-21 NOTE — Progress Notes (Signed)
PROGRESS NOTE  Kayla [name]:[id number] DOB: 26-Sep-1940 DOA: 03/18/2021 PCP: [name], MD  HPI/Recap of past 24 hours: Kayla [name] is a 80 y.o. female with medical history significant for paroxysmal atrial fibrillation on Eliquis and amiodarone, HFpEF (last EF 60-65%, G2 DD by TTE 10/03/2020), COPD with chronic bronchitis, CKD stage IIIb, insulin-dependent T2DM, HTN, HLD, hypothyroidism, and OSA on CPAP who presented to [facility] ED for evaluation of cough and worsening shortness of breath.  Work-up revealed acute COPD exacerbation, bronchitis, and acute hypoxic respiratory failure requiring 2 L O2 nasal cannula, for which she was started on azithromycin and IV Solu-Medrol.  Patient endorsed right lower extremity pain and tenderness on palpation.  On exam her right lower extremity appears edematous, erythematous, warm, and tender to palpation.  She was started on empiric IV antibiotics cefazolin for right lower extremity cellulitis.  She reports she went through 2 rounds of oral antibiotics prior to presentation, prescribed by her primary care provider.  MRSA screen negative.  Hospital course complicated by AKI likely prerenal in the setting of home oral diuretics.  Home Lasix held on 03/20/2021.  Seen by cardiology, signed off, recommended to restart home Demadex.  03/21/2021: Seen and examined at bedside.  Right lower extremity cellulitis is improving, planning to transition to oral antibiotics tomorrow for DC planning.  Assessment/Plan: Principal Problem:   COPD with acute exacerbation (HCC) Active Problems:   HYPOTHYROIDISM, POSTSURGICAL   Insulin dependent type 2 diabetes mellitus ([place])   Hyperlipidemia associated with type 2 diabetes mellitus ([place])   OBSTRUCTIVE SLEEP APNEA   Hypertension associated with diabetes ([place])   Acute respiratory failure with hypoxia (HCC)   Paroxysmal A-fib (HCC)   Cellulitis of right lower extremity   CKD (chronic kidney disease), stage III  (HCC)   COPD with exacerbation (HCC)  Acute COPD exacerbation with bronchitis, improving Presented with worsening shortness of breath, a productive cough, and wheezing. Personally reviewed 2 view chest x-ray done on admission which showed central airway thickening suspicious for bronchitis. She was started on azithromycin and IV Solu-Medrol 40 mg twice daily, wean off IV Solu-Medrol to prednisone 40 mg daily to start on 03/22/2021 Continue pulmonary toilet, Mucinex 1200 mg twice daily x3 days, saline nebs twice daily x3 days. Continue bronchodilators Continue incentive spirometer Continue to mobilize as tolerated.  Resolved acute metabolic encephalopathy/delirium in the setting of her underlying acute illnesses. Episode of delirium 03/20/2021 Continue to regulate sleep cycle with nonpharmacological intervention for delirium Reorient often, open blinds during the day, out of bed to chair during the day. Seroquel nightly and melatonin for sleep.  If melatonin not effective, consider trazodone at the lowest dose. Continue fall precautions and delirium precautions.  AKI on CKD 3B, suspect prerenal in the setting of intravascular volume depletion Home diuretics held on 03/20/2021, restarted on 03/21/2021 per cardiology. Baseline creatinine appears to be 1.4 with GFR of 36. Creatinine uptrending 1.86 with GFR 27 Closely monitor urine output with strict I's and O's Repeat BMP in the morning  Resolved acute hypoxic respiratory failure secondary to COPD exacerbation Not on oxygen supplementation at baseline Initially required 2 L to maintain a saturation greater than 88% Currently oxygen saturation 92% on room air with walking   Right lower extremity cellulitis, POA Failed outpatient antibiotics therapy x2. MRSA screening test negative. Continue IV Ancef for now. Continue to follow blood cultures x2 peripherally, no growth to date..  Type 2 diabetes with hyperglycemia likely exacerbated  by IV steroids Last  hemoglobin A1c 8.4 on 01/30/2021 Continue long-acting insulin and insulin sliding scale. Added NovoLog 5 units 3 times daily due to hyperglycemia. Continue to closely monitor CBGs  Anemia of chronic disease Hemoglobin is stable and uptrending 10.9 from 10.0. No overt bleeding  Paroxysmal A. fib on Eliquis Continue home regimen, along with amiodarone. Continue Eliquis for CVA prevention Continue to closely monitor on telemetry  Diabetic polyneuropathy Continue home regimen, on gabapentin 800 mg 3 times daily.  Hypertension Blood pressure stable Continue home regimen Continue to monitor vital signs.  Hyperlipidemia Continue home Lipitor.  Physical debility PT OT assessed and recommended home PT Mobilize as tolerated Continue fall precautions   Code Status: DNR  Family Communication: Husband at bedside  Disposition Plan: Likely will discharge to home once cellulitis has improved.   Consultants: Cardiology consult on 03/21/2021 for management of her cardiac medications.  Procedures: None.  Antimicrobials: Azithromycin Ancef.  DVT prophylaxis: Eliquis  Status is: Inpatient  Inpatient status.  Patient will require least 2 midnights for further evaluation and treatment of present condition.      Objective: Vitals:   03/21/21 0510 03/21/21 0921 03/21/21 0944 03/21/21 1332  BP: 137/77  (!) 140/56 (!) 125/56  Pulse: 87  78 64  Resp: 19  18 18   Temp: 97.8 F (36.6 C)   97.9 F (36.6 C)  TempSrc: Oral   Oral  SpO2: 92% 94% 94% 93%  Weight:      Height:       No intake or output data in the 24 hours ending 03/21/21 1802  Filed Weights   03/19/21 1831  Weight: 110.8 kg    Exam:  General: 80 y.o. year-old female with developmentalist in no distress.  She is alert oriented x3.   Cardiovascular: Irregular rate and rhythm no rubs or gallops Respiratory: Mild diffuse wheezing bilaterally.   Abdomen: Soft nontender normal bowel  sounds present.  Musculoskeletal: Trace lower extremity edema bilaterally. Skin: Right lower extremity cellulitis improving. Psychiatry: Mood is appropriate for condition setting. Neuro: Moves all 4 extremities.  Data Reviewed: CBC: Recent Labs  Lab 03/18/21 1333 03/19/21 0500 03/20/21 0815 03/21/21 0538  WBC 15.8* 15.7* 15.8* 15.5*  NEUTROABS 12.8*  --   --   --   HGB 10.8* 10.0* 10.9* 10.0*  HCT 34.9* 31.5* 34.9* 32.1*  MCV 94.6 92.1 92.3 92.0  PLT 388 362 450* 063*   Basic Metabolic Panel: Recent Labs  Lab 03/18/21 1333 03/19/21 0500 03/20/21 0815 03/21/21 0538  NA 135 138 137 138  K 4.9 4.6 4.2 3.9  CL 98 101 99 102  CO2 26 27 25 26   GLUCOSE 279* 183* 270* 194*  BUN 38* 37* 57* 67*  CREATININE 1.48* 1.61* 1.86* 1.89*  CALCIUM 9.6 9.1 8.9 8.7*   GFR: Estimated Creatinine Clearance: 29.9 mL/min (A) (by C-G formula based on SCr of 1.89 mg/dL (H)). Liver Function Tests: No results for input(s): AST, ALT, ALKPHOS, BILITOT, PROT, ALBUMIN in the last 168 hours. No results for input(s): LIPASE, AMYLASE in the last 168 hours. No results for input(s): AMMONIA in the last 168 hours. Coagulation Profile: No results for input(s): INR, PROTIME in the last 168 hours. Cardiac Enzymes: No results for input(s): CKTOTAL, CKMB, CKMBINDEX, TROPONINI in the last 168 hours. BNP (last 3 results) Recent Labs    09/24/20 1128  PROBNP 562   HbA1C: No results for input(s): HGBA1C in the last 72 hours. CBG: Recent Labs  Lab 03/20/21 1623 03/20/21 2102 03/21/21 0160 03/21/21  1328 03/21/21 1705  GLUCAP 283* 276* 207* 335* 209*   Lipid Profile: No results for input(s): CHOL, HDL, LDLCALC, TRIG, CHOLHDL, LDLDIRECT in the last 72 hours. Thyroid Function Tests: No results for input(s): TSH, T4TOTAL, FREET4, T3FREE, THYROIDAB in the last 72 hours. Anemia Panel: No results for input(s): VITAMINB12, FOLATE, FERRITIN, TIBC, IRON, RETICCTPCT in the last 72 hours. Urine analysis:     Component Value Date/Time   COLORURINE YELLOW 08/15/2019 2257   APPEARANCEUR HAZY (A) 08/15/2019 2257   LABSPEC 1.014 08/15/2019 2257   PHURINE 5.0 08/15/2019 2257   GLUCOSEU NEGATIVE 08/15/2019 2257   GLUCOSEU NEGATIVE 12/09/2018 0944   HGBUR NEGATIVE 08/15/2019 2257   BILIRUBINUR NEGATIVE 08/15/2019 2257   KETONESUR NEGATIVE 08/15/2019 2257   PROTEINUR 100 (A) 08/15/2019 2257   UROBILINOGEN 0.2 12/09/2018 0944   NITRITE NEGATIVE 08/15/2019 2257   LEUKOCYTESUR MODERATE (A) 08/15/2019 2257   Sepsis Labs: @LABRCNTIP (procalcitonin:4,lacticidven:4)  ) Recent Results (from the past 240 hour(s))  Resp Panel by RT-PCR (Flu A&B, Covid) Nasopharyngeal Swab     Status: None   Collection Time: 03/18/21  1:33 PM   Specimen: Nasopharyngeal Swab; Nasopharyngeal(NP) swabs in vial transport medium  Result Value Ref Range Status   SARS Coronavirus 2 by RT PCR NEGATIVE NEGATIVE Final    Comment: (NOTE) SARS-CoV-2 target nucleic acids are NOT DETECTED.  The SARS-CoV-2 RNA is generally detectable in upper respiratory specimens during the acute phase of infection. The lowest concentration of SARS-CoV-2 viral copies this assay can detect is 138 copies/mL. A negative result does not preclude SARS-Cov-2 infection and should not be used as the sole basis for treatment or other patient management decisions. A negative result may occur with  improper specimen collection/handling, submission of specimen other than nasopharyngeal swab, presence of viral mutation(s) within the areas targeted by this assay, and inadequate number of viral copies(<138 copies/mL). A negative result must be combined with clinical observations, patient history, and epidemiological information. The expected result is Negative.  Fact Sheet for Patients:  EntrepreneurPulse.com.au  Fact Sheet for Healthcare Providers:  IncredibleEmployment.be  This test is no t yet approved or cleared by  the [place] FDA and  has been authorized for detection and/or diagnosis of SARS-CoV-2 by FDA under an Emergency Use Authorization (EUA). This EUA will remain  in effect (meaning this test can be used) for the duration of the COVID-19 declaration under Section 564(b)(1) of the Act, 21 U.S.C.section 360bbb-3(b)(1), unless the authorization is terminated  or revoked sooner.       Influenza A by PCR NEGATIVE NEGATIVE Final   Influenza B by PCR NEGATIVE NEGATIVE Final    Comment: (NOTE) The Xpert Xpress SARS-CoV-2/FLU/RSV plus assay is intended as an aid in the diagnosis of influenza from Nasopharyngeal swab specimens and should not be used as a sole basis for treatment. Nasal washings and aspirates are unacceptable for Xpert Xpress SARS-CoV-2/FLU/RSV testing.  Fact Sheet for Patients: EntrepreneurPulse.com.au  Fact Sheet for Healthcare Providers: IncredibleEmployment.be  This test is not yet approved or cleared by the [place] FDA and has been authorized for detection and/or diagnosis of SARS-CoV-2 by FDA under an Emergency Use Authorization (EUA). This EUA will remain in effect (meaning this test can be used) for the duration of the COVID-19 declaration under Section 564(b)(1) of the Act, 21 U.S.C. section 360bbb-3(b)(1), unless the authorization is terminated or revoked.  Performed at [facility], [place] [address] [place], [place] [address]   Culture, blood (routine x 2)  Status: None (Preliminary result)   Collection Time: 03/19/21  4:41 PM   Specimen: Right Antecubital; Blood  Result Value Ref Range Status   Specimen Description   Final    RIGHT ANTECUBITAL Performed at [facility] [address] [place], [place] [address]    Special Requests   Final    BOTTLES DRAWN AEROBIC ONLY Blood Culture adequate volume Performed at [facility] [address],  [place], [place] [address]    Culture   Final    NO GROWTH 2 DAYS Performed at [facility] [address] [place], [place] [address]    Report Status PENDING  Incomplete  Culture, blood (routine x 2)     Status: None (Preliminary result)   Collection Time: 03/19/21  4:41 PM   Specimen: BLOOD RIGHT HAND  Result Value Ref Range Status   Specimen Description   Final    BLOOD RIGHT HAND Performed at [facility] [address] [place], [place] [address]    Special Requests   Final    BOTTLES DRAWN AEROBIC ONLY Blood Culture adequate volume Performed at [place] [address] [place], [place] [address]    Culture   Final    NO GROWTH 2 DAYS Performed at [facility] [address], [place], [place] [address]    Report Status PENDING  Incomplete  MRSA Next Gen by PCR, Nasal     Status: None   Collection Time: 03/20/21  1:00 AM   Specimen: Nasal Mucosa; Nasal Swab  Result Value Ref Range Status   MRSA by PCR Next Gen NOT DETECTED NOT DETECTED Final    Comment: (NOTE) The GeneXpert MRSA Assay (FDA approved for NASAL specimens only), is one component of a comprehensive MRSA colonization surveillance program. It is not intended to diagnose MRSA infection nor to guide or monitor treatment for MRSA infections. Test performance is not FDA approved in patients less than 63 years old. Performed at [facility], [place] [address], [place], [place] [address]       Studies: No results found.  Scheduled Meds:  allopurinol  100 mg Oral BID   amiodarone  200 mg Oral Daily   amLODipine  5 mg Oral Daily   apixaban  5 mg Oral BID   atorvastatin  40 mg Oral Daily   azithromycin  250 mg Oral Daily   fluticasone furoate-vilanterol  1 puff Inhalation Daily   gabapentin  400 mg Oral TID   guaiFENesin  1,200 mg Oral BID   insulin aspart  0-20 Units Subcutaneous TID WC   insulin aspart  0-5 Units Subcutaneous QHS    insulin aspart  5 Units Subcutaneous TID WC   insulin glargine-yfgn  80 Units Subcutaneous BID   ipratropium-albuterol  3 mL Nebulization BID   levothyroxine  224 mcg Oral Q0600   losartan  50 mg Oral Daily   melatonin  3 mg Oral QHS   methylPREDNISolone (SOLU-MEDROL) injection  40 mg Intravenous Q12H   metoprolol succinate  100 mg Oral Daily   mupirocin cream   Topical Daily   QUEtiapine  12.5 mg Oral QHS   sodium chloride flush  3 mL Intravenous Q12H   sodium chloride HYPERTONIC  4 mL Nebulization BID   umeclidinium bromide  1 puff Inhalation Daily    Continuous Infusions:   ceFAZolin (ANCEF) IV 2 g (03/21/21 1728)     LOS: 2 days  Kayleen Memos, MD [facility] Pager [phone]  If 7PM-7AM, please contact night-coverage [email or web address] Password [facility] 03/21/2021, 6:02 PM

## 2021-03-21 NOTE — Consult Note (Addendum)
Cardiology Consultation:   Patient ID: Kayla [name] MRN: [id number]; DOB: Nov 28, 1940  Admit date: 03/18/2021 Date of Consult: 03/21/2021  PCP:  [name], MD   [facility] Providers Cardiologist:  Candee Furbish, MD  Electrophysiologist:  Virl Axe, MD       Patient Profile:   Kayla [name] is a 80 y.o. female with a hx of DM, HTN, HLD, mod carotid dz, GERD, OSA, mild CAD by cath 2006, D-CHF, PAF failed dofetilide, MDT MRI PPM for sinus brady, who is being seen 03/21/2021 for the evaluation of CHF at the request of Dr Nevada Crane.  History of Present Illness:   Kayla [name] was admitted 09/29-10/01 for CHF exacerbation, d/c wt 110.2 kg 242.95 lbs, Cr 1.92, Demadex 80 mg w/ metolazone 2.5 mg qod.  Pt was seen by Ms Purcell Nails, DNP, on 10/10. Wt 242.6 lbs, Keflex added for LE cellulitis.  Pt admitted 11/15 for cough and SOB, BP elevated, acute resp failure 2nd COPD, bronchitis, ABX and steroids, nebs used to treat. Wt 110.8 kg, 244 lbs.   Currently she is fine and wants to go home Her cough is better. Her RLE cellulitis is improving she has much Less dyspnea than when admitted    Her respiratory panel is negative Her BNP is only 293 CXR shows no CHF and only bronchitis  She normally takes 80 mg of demedex at home daily   TTE 10/03/20 EF 60-65% normal strain no significant valve dx   Past Medical History:  Diagnosis Date   A-fib ([place])    Allergy    Anxiety    Arthritis    CAD (coronary artery disease)    Mild per remote cath in 2006, /    Nuclear, January, 2013, no significant abnormality,   Carotid artery disease ([place])    Doppler, January, 2013, 0 37% RIC A., 90-24% LICA, stable   Cataract    CHF (congestive heart failure) ([place])    Complication of anesthesia    wakes up "mean"   COPD 02/16/2007   Intolerant of Spiriva, tudorza Intolerant of Trelegy    COPD (chronic obstructive pulmonary disease) ([place])    Coronary atherosclerosis 11/24/2008   Catheterization  2006, nonobstructive coronary disease   //   nuclear 2013, low risk     Diabetes mellitus, type 2 ([place])    Diverticula of colon    DIVERTICULOSIS OF COLON 03/23/2007   Qualifier: Diagnosis of  By: Halford Chessman MD, Vineet     Dizziness    occasional   Dizzy spells 05/04/2011   DM (diabetes mellitus) ([place]) 03/23/2007   Qualifier: Diagnosis of  By: Halford Chessman MD, Vineet     Dyspnea    On exertion   Ejection fraction    Essential hypertension 02/16/2007   Qualifier: Diagnosis of  By: Rosana Hoes CMA, Tammy     G E R D 03/23/2007   Qualifier: Diagnosis of  By: Halford Chessman MD, Vineet     Gall bladder disease 04/28/2016   Gastritis and gastroduodenitis    GERD (gastroesophageal reflux disease)    Goiter    hx of   Headache(784.0)    migraine   Hyperlipidemia    Hypertension    Hypothyroidism    HYPOTHYROIDISM, POSTSURGICAL 06/04/2008   Qualifier: Diagnosis of  By: Loanne Drilling MD, Sean A    Left ventricular hypertrophy    Leg edema    occasional swelling   Leg swelling 12/09/2018   Myofascial pain syndrome, cervical 01/06/2018   Obesity  OBESITY 11/24/2008   Qualifier: Diagnosis of  By: Jaramillo, [place] 02/16/2007   Auto CPAP 09/03/13 to 10/02/13 >> used on 30 of 30 nights with average 6 hrs and 3 min.  Average AHI is 3.1 with median CPAP 5 cm H2O and 95 th percentile CPAP 6 cm H2O.     Occipital neuralgia of right side 01/06/2018   OSA (obstructive sleep apnea)    cpap setting of 12   Pain in joint of right hip 01/21/2018   Palpitations 01/21/2011   48 hour Holter, 2015, scattered PACs and PVCs.    Pneumonia    PONV (postoperative nausea and vomiting)    severe after every surgery   Pulmonary hypertension, secondary    RHINITIS 07/21/2010        RUQ abdominal pain    S/P left TKA 07/27/2011   Sinus node dysfunction ([place]) 11/08/2015   Sleep apnea    Spinal stenosis of cervical region 11/07/2014   Urinary frequency 12/09/2018   UTI (urinary tract infection) 08/15/2019   per pt report    VENTRICULAR HYPERTROPHY, LEFT 11/24/2008   Qualifier: Diagnosis of  By: Sidney Ace      Past Surgical History:  Procedure Laterality Date   ABDOMINAL HYSTERECTOMY     with appendectomy and colon polypectomy   AMPUTATION TOE Left 12/15/2019   Procedure: AMPUTATION  LEFT GREAT TOE;  Surgeon: Evelina Bucy, DPM;  Location: [facility];  Service: Podiatry;  Laterality: Left;   ANTERIOR CERVICAL DECOMP/DISCECTOMY FUSION N/A 11/07/2014   Procedure: Anterior Cervical diskectomy and fusion Cervical four-five, Cervical five-six, Cervical six-seven;  Surgeon: Kary Kos, MD;  Location: [place] [facility]URO ORS;  Service: Neurosurgery;  Laterality: N/A;   APPENDECTOMY     ARTERY BIOPSY Right 02/16/2017   Procedure: BIOPSY TEMPORAL ARTERY;  Surgeon: Katha Cabal, MD;  Location: [facility] ORS;  Service: Vascular;  Laterality: Right;   ARTERY BIOPSY Left 04/16/2020   Procedure: BIOPSY TEMPORAL ARTERY;  Surgeon: Katha Cabal, MD;  Location: [facility] ORS;  Service: Vascular;  Laterality: Left;  LEFT TEMPLE   BACK SURGERY     CARDIAC CATHETERIZATION     CATARACT EXTRACTION W/ INTRAOCULAR LENS  IMPLANT, BILATERAL Bilateral    CHOLECYSTECTOMY N/A 04/29/2016   Procedure: LAPAROSCOPIC CHOLECYSTECTOMY WITH INTRAOPERATIVE CHOLANGIOGRAM;  Surgeon: Alphonsa Overall, MD;  Location: [facility];  Service: General;  Laterality: N/A;   EP IMPLANTABLE DEVICE N/A 11/08/2015   MRI COMPATABLE -- Procedure: Pacemaker Implant;  Surgeon: Deboraha Sprang, MD;  Location: [place] CV LAB;  Service: Cardiovascular;  Laterality: N/A;   INSERT / REPLACE / REMOVE PACEMAKER  2017   Dr. Jens Som [facility])   JOINT REPLACEMENT     LUMBAR [place] SURGERY  1990's   x 2   THYROIDECTOMY  2011   TOTAL KNEE ARTHROPLASTY  07/27/2011   Procedure: TOTAL KNEE ARTHROPLASTY;  Surgeon: Mauri Pole, MD;  Location: [facility];  Service: Orthopedics;  Laterality: Left;   UPPER ESOPHAGEAL ENDOSCOPIC ULTRASOUND (EUS) N/A 05/14/2016   Procedure: UPPER ESOPHAGEAL ENDOSCOPIC  ULTRASOUND (EUS);  Surgeon: Milus Banister, MD;  Location: [facility];  Service: Endoscopy;  Laterality: N/A;  radial only-will be done mod if needed     Home Medications:  Prior to Admission medications   Medication Sig Start Date End Date Taking? Authorizing Provider  acetaminophen (TYLENOL) 500 MG tablet Take 1,000 mg by mouth every 6 (six) hours as needed for moderate pain (for headaches).  Yes [provider]  albuterol (PROVENTIL) (2.5 MG/3ML) 0.083% nebulizer solution Take 3 mLs (2.5 mg total) by nebulization every 2 (two) hours as needed for wheezing or shortness of breath. 05/15/20  Yes Chesley Mires, MD  albuterol (VENTOLIN HFA) 108 (90 Base) MCG/ACT inhaler Inhale 2 puffs into the lungs every 6 (six) hours as needed for wheezing or shortness of breath. 01/26/21  Yes Tanda Rockers, MD  allopurinol (ZYLOPRIM) 100 MG tablet Take 100 mg by mouth 2 (two) times daily. 04/16/19  Yes [provider]  amiodarone (PACERONE) 200 MG tablet Take 1 tablet (200 mg total) by mouth daily. 07/09/20  Yes Deboraha Sprang, MD  amLODipine (NORVASC) 5 MG tablet Take 1 tablet (5 mg total) by mouth daily. 03/10/21  Yes Jerline Pain, MD  ascorbic acid (VITAMIN C) 500 MG tablet Take 500 mg by mouth every morning.   Yes [provider]  atorvastatin (LIPITOR) 40 MG tablet TAKE 1 TABLET EVERY DAY Patient taking differently: Take 40 mg by mouth daily. 10/23/20  Yes Jerline Pain, MD  benzonatate (TESSALON) 200 MG capsule Take 200 mg by mouth 3 (three) times daily as needed for cough.   Yes [provider]  BREZTRI AEROSPHERE 160-9-4.8 MCG/ACT AERO INHALE 2 PUFFS INTO THE LUNGS IN THE MORNING AND AT BEDTIME. 01/15/21  Yes Chesley Mires, MD  Carboxymethylcellulose Sodium (ARTIFICIAL TEARS OP) Place 1 drop into both eyes daily as needed.   Yes [provider]  Dextromethorphan-guaiFENesin (TUSSIN DM MAX SUGAR-FREE PO) Take 5 mLs by mouth every 4 (four) hours as needed (for  coughing).   Yes [provider]  ELIQUIS 5 MG TABS tablet TAKE 1 TABLET TWICE DAILY Patient taking differently: Take 5 mg by mouth 2 (two) times daily. 09/09/20  Yes Deboraha Sprang, MD  ferrous sulfate 325 (65 FE) MG tablet Take 1 tablet (325 mg total) by mouth daily with breakfast. 01/26/20  Yes Deboraha Sprang, MD  fluticasone [facility]) 50 MCG/ACT nasal spray USE 2 SPRAYS NASALLY DAILY Patient taking differently: Place 2 sprays into both nostrils daily as needed for allergies or rhinitis. 02/26/17  Yes Collene Gobble, MD  gabapentin (NEURONTIN) 800 MG tablet Take 800 mg by mouth 3 (three) times daily. 10/29/20  Yes [provider]  HYDROcodone-acetaminophen (NORCO/VICODIN) 5-325 MG tablet Take 1 tablet by mouth every 4 (four) hours as needed for moderate pain. 01/28/21  Yes [provider]  insulin aspart (NOVOLOG) 100 UNIT/ML FlexPen Inject 5-15 Units into the skin 3 (three) times daily with meals. Per sliding scale (and an additional 1 unit for every 50 points for a BGL >130) Sliding scale   Yes [provider]  insulin glargine (LANTUS) 100 UNIT/ML injection Inject 110 Units into the skin in the morning and at bedtime.   Yes [provider]  levothyroxine (SYNTHROID) 112 MCG tablet Take 224 mcg by mouth daily. 08/02/20  Yes [provider]  losartan (COZAAR) 50 MG tablet Take 50 mg by mouth daily. 02/14/21  Yes [provider]  metoprolol succinate (TOPROL-XL) 100 MG 24 hr tablet TAKE 1 TABLET EVERY DAY WITH OR IMMEDIATELY AFTER A MEAL Patient taking differently: Take 100 mg by mouth daily. 11/14/20  Yes Deboraha Sprang, MD  naloxone [facility]) nasal spray 4 mg/0.1 mL Place 4 mg into the nose once as needed for opioid reversal. 12/04/20  Yes [provider]  nitroGLYCERIN (NITROSTAT) 0.4 MG SL tablet Place 1 tablet (0.4 mg total) under  the tongue every 5 (five) minutes as needed for chest pain. 04/13/19  Yes Jerline Pain, MD   PRESCRIPTION MEDICATION 1 Dose by Other route at bedtime. CPAP: At bedtime and "as needed for shortness of breath"   Yes [provider]  torsemide (DEMADEX) 20 MG tablet Take 80 mg by mouth daily.   Yes [provider]  vitamin B-12 (CYANOCOBALAMIN) 500 MCG tablet Take 500 mcg by mouth daily.   Yes [provider]  Vitamin D3 (VITAMIN D) 25 MCG tablet Take 1,000 Units by mouth every morning.   Yes [provider]  Alcohol Swabs (B-D SINGLE USE SWABS REGULAR) PADS  11/24/19   [provider]  Blood Glucose Calibration (TRUE METRIX LEVEL 1) Low SOLN  11/27/19   [provider]  torsemide 40 MG TABS Take 80 mg by mouth daily. Patient not taking: No sig reported 02/01/21   Almyra Deforest, PA  TRUE METRIX BLOOD GLUCOSE TEST test strip  09/18/17   [provider]  TRUEplus Lancets 28G [place]  11/24/19   [provider]    Inpatient Medications: Scheduled Meds:  allopurinol  100 mg Oral BID   amiodarone  200 mg Oral Daily   amLODipine  5 mg Oral Daily   apixaban  5 mg Oral BID   atorvastatin  40 mg Oral Daily   azithromycin  250 mg Oral Daily   fluticasone furoate-vilanterol  1 puff Inhalation Daily   gabapentin  400 mg Oral TID   guaiFENesin  1,200 mg Oral BID   insulin aspart  0-20 Units Subcutaneous TID WC   insulin aspart  0-5 Units Subcutaneous QHS   insulin aspart  5 Units Subcutaneous TID WC   insulin glargine-yfgn  80 Units Subcutaneous BID   ipratropium-albuterol  3 mL Nebulization BID   levothyroxine  224 mcg Oral Q0600   losartan  50 mg Oral Daily   melatonin  3 mg Oral QHS   methylPREDNISolone (SOLU-MEDROL) injection  40 mg Intravenous Q12H   metoprolol succinate  100 mg Oral Daily   QUEtiapine  12.5 mg Oral QHS   sodium chloride flush  3 mL Intravenous Q12H   sodium chloride HYPERTONIC  4 mL Nebulization BID   umeclidinium bromide  1 puff Inhalation Daily   Continuous Infusions:   ceFAZolin (ANCEF) IV 2 g  (03/21/21 0852)   PRN Meds: acetaminophen **OR** acetaminophen, albuterol, benzonatate, HYDROcodone-acetaminophen, lip balm, ondansetron **OR** ondansetron (ZOFRAN) IV  Allergies:    Allergies  Allergen Reactions   Dulaglutide Nausea Only, Other (See Comments) and Cough    Made the patient sick to her stomach (only) several days after using it Trulicity   Oxycodone Itching    Pt still takes this med even thought it causes itching   Trelegy Ellipta [Fluticasone-Umeclidin-Vilant] Other (See Comments)    unknown    Social History:   Social History   Socioeconomic History   Marital status: Married    Spouse name: Carilyn Woolston   Number of children: 3   Years of education: 12   Highest education level: Not on file  Occupational History   Occupation: retired  Tobacco Use   Smoking status: Former    Packs/day: 2.00    Years: 33.00    Pack years: 66.00    Types: Cigarettes    Start date: 1966    Quit date: 05/04/1997    Years since quitting: 23.8   Smokeless tobacco: Never  Vaping Use   Vaping Use: Never  used  Substance and Sexual Activity   Alcohol use: No   Drug use: No   Sexual activity: Not on file  Other Topics Concern   Not on file  Social History Narrative   Patient lives at home with her husband Jeneen Rinks .   Retired.   Education 12 th grade.   Right handed   Caffeine two cups of coffee.   Social Determinants of Health   Financial Resource Strain: Low Risk    Difficulty of Paying Living Expenses: Not very hard  Food Insecurity: No Food Insecurity   Worried About Charity fundraiser in the Last Year: Never true   Ran Out of Food in the Last Year: Never true  Transportation Needs: No Transportation Needs   Lack of Transportation (Medical): No   Lack of Transportation (Non-Medical): No  Physical Activity: Not on file  Stress: Not on file  Social Connections: Not on file  Intimate Partner Violence: Not on file    Family History:    Family History  Problem  Relation Age of Onset   Heart Problems Mother    Heart defect Mother        valve problems   Heart Problems Father    Heart disease Brother    Lung cancer Daughter        non small cell    Diabetes Maternal Aunt    Diabetes Maternal Uncle    Throat cancer Brother    Colon cancer Neg Hx    Colon polyps Neg Hx    Esophageal cancer Neg Hx    Rectal cancer Neg Hx    Stomach cancer Neg Hx      ROS:  Please see the history of present illness.   All other ROS reviewed and negative.     Physical Exam/Data:   Vitals:   03/20/21 2123 03/21/21 0510 03/21/21 0921 03/21/21 0944  BP:  137/77  (!) 140/56  Pulse:  87  78  Resp: (!) 21 19  18   Temp:  97.8 F (36.6 C)    TempSrc:  Oral    SpO2:  92% 94% 94%  Weight:      Height:       No intake or output data in the 24 hours ending 03/21/21 1026  Last 3 Weights 03/19/2021 02/10/2021 02/01/2021  Weight (lbs) 244 lb 4.3 oz 242 lb 6.4 oz 242 lb 15.2 oz  Weight (kg) 110.8 kg 109.952 kg 110.2 kg     Obese female PPM under left clavicle Mild rhonchi no wheezing Plus one bilateral edema with healing cellulitis RLE  EKG:  The EKG was personally reviewed and demonstrates:  NSR rate 64 normal  Telemetry:  Telemetry was personally reviewed and demonstrates:  NSR 03/21/2021   Relevant CV Studies: TTE 10/03/20 IMPRESSIONS     1. Left ventricular ejection fraction, by estimation, is 60 to 65%. The  left ventricle has normal function. The left ventricle has no regional  wall motion abnormalities. There is severe concentric left ventricular  hypertrophy. Left ventricular diastolic   parameters are consistent with Grade II diastolic dysfunction  (pseudonormalization). The global longitudinal strain is normal (assessed  in the A4c view with no foreshortening).   2. Right ventricular systolic function is normal. The right ventricular  size is normal. There is normal pulmonary artery systolic pressure.   3. The mitral valve is grossly  normal. No evidence of mitral valve  regurgitation.   4. The aortic valve is tricuspid. Aortic valve  regurgitation is not  visualized. No aortic stenosis is present.   Comparison(s): A prior study was performed on 08/13/19. Prior images  reviewed side by side. LV is slightly less dynamic.   Laboratory Data:  High Sensitivity Troponin:   Recent Labs  Lab 03/18/21 2142  TROPONINIHS 17     Chemistry Recent Labs  Lab 03/19/21 0500 03/20/21 0815 03/21/21 0538  NA 138 137 138  K 4.6 4.2 3.9  CL 101 99 102  CO2 27 25 26   GLUCOSE 183* 270* 194*  BUN 37* 57* 67*  CREATININE 1.61* 1.86* 1.89*  CALCIUM 9.1 8.9 8.7*  GFRNONAA 32* 27* 27*  ANIONGAP 10 13 10     No results for input(s): PROT, ALBUMIN, AST, ALT, ALKPHOS, BILITOT in the last 168 hours. Lipids No results for input(s): CHOL, TRIG, HDL, LABVLDL, LDLCALC, CHOLHDL in the last 168 hours.  Hematology Recent Labs  Lab 03/19/21 0500 03/20/21 0815 03/21/21 0538  WBC 15.7* 15.8* 15.5*  RBC 3.42* 3.78* 3.49*  HGB 10.0* 10.9* 10.0*  HCT 31.5* 34.9* 32.1*  MCV 92.1 92.3 92.0  MCH 29.2 28.8 28.7  MCHC 31.7 31.2 31.2  RDW 16.5* 16.3* 16.1*  PLT 362 450* 438*   Thyroid No results for input(s): TSH, FREET4 in the last 168 hours.  BNP Recent Labs  Lab 03/20/21 0815  BNP 293.1*    DDimer No results for input(s): DDIMER in the last 168 hours.   Radiology/Studies:  DG Chest 2 View  Result Date: 03/18/2021 CLINICAL DATA:  Shortness of breath. Cough and chest pain for 4 days. EXAM: CHEST - 2 VIEW COMPARISON:  Radiographs 01/31/2021 and CT 11/14/2020. FINDINGS: Patient is mildly rotated to the right on the AP view. The left subclavian pacemaker leads appear unchanged at the level of the right atrium and right ventricle. The heart size and mediastinal contours are stable. There is central airway thickening without confluent airspace opacity, pleural effusion or pneumothorax. Mild degenerative changes are present throughout the  spine. Patient is status post lower cervical fusion and thyroidectomy. IMPRESSION: Central airway thickening suspicious for bronchitis. Otherwise stable chest. Electronically Signed   By: Richardean Sale M.D.   On: 03/18/2021 14:40     Assessment and Plan:   CHF:  mild volume overload TTE benign in past with normal EF and grade one diastloic CXR no CHF Continue home demedex dose no need for further recommendations Her baseline Cr is 1.9 or so  So would not hold demedex for this  Chest Pain:  R/O normal ECG related to bronchitis no need for further w/u PPM:  normal function by PACEART Cellulitis:  improving ? Iv ancef done COPD/Bronchits exacerbation quit smoking year ago improved continue inhalers  PAF:  continue amiodarone and Eliquis     Risk Assessment/Risk Scores:    [facility] will sign off.   Medication Recommendations:  Home demedex Other recommendations (labs, testing, etc):  None  Follow up as an outpatient:  Dr Marlou Porch   For questions or updates, please contact [facility] Please consult [email or web address] for contact info under    Signed, Jenkins Rouge, MD  03/21/2021 10:26 AM

## 2021-03-21 NOTE — Care Management Important Message (Signed)
Important Message  Patient Details IM Letter placed in Patients room. Name: Kayla [name] MRN: [id number] Date of Birth: [birth year]   Medicare Important Message Given:  Yes     Kerin Salen 03/21/2021, 11:53 AM

## 2021-03-21 NOTE — Progress Notes (Signed)
Physical Therapy Treatment Patient Details Name: Kayla [name] MRN: [id number] DOB: Sep 19, 1940 Today's Date: 03/21/2021   History of Present Illness 80 y.o. female who presented to [facility] ED for evaluation of cough and worsening shortness of breath.  Work-up revealed acute COPD exacerbation, acute hypoxic respiratory failure requiring 2 L O2 nasal cannula, RLE cellulitis. Pt  with medical history significant for paroxysmal atrial fibrillation on Eliquis and amiodarone, HFpEF (last EF 60-65%, G2 DD by TTE 10/03/2020), COPD with chronic bronchitis, CKD stage IIIb, insulin-dependent T2DM, HTN, HLD, hypothyroidism, and OSA on CPAP.    PT Comments    Pt tolerated increased ambulation distance of 90' with RW, SaO2 92% on room air walking, 3/4 dyspnea limited distance. She is progressing well with mobility.    Recommendations for follow up therapy are one component of a multi-disciplinary discharge planning process, led by the attending physician.  Recommendations may be updated based on patient status, additional functional criteria and insurance authorization.  Follow Up Recommendations  Home health PT     Assistance Recommended at Discharge Intermittent Supervision/Assistance  Equipment Recommendations  None recommended by PT    Recommendations for Other Services       Precautions / Restrictions Precautions Precautions: Fall Precaution Comments: 1 fall in past 6 months, has partially closed lacerations on RLE from that, tripped on a pillow Restrictions Weight Bearing Restrictions: No     Mobility  Bed Mobility               General bed mobility comments: up in recliner    Transfers Overall transfer level: Modified independent Equipment used: Rolling walker (2 wheels) Transfers: Sit to/from Stand Sit to Stand: Modified independent (Device/Increase time)                Ambulation/Gait Ambulation/Gait assistance: Supervision Gait Distance (Feet): 90 Feet Assistive  device: Rolling walker (2 wheels) Gait Pattern/deviations: Step-through pattern;Decreased stride length Gait velocity: decr     General Gait Details: no overt loss of balance,  SaO2 92% on room air walking, distance limited by 3/4 dyspnea   Stairs             Wheelchair Mobility    Modified Rankin (Stroke Patients Only)       Balance Overall balance assessment: History of Falls;Needs assistance   Sitting balance-Leahy Scale: Good       Standing balance-Leahy Scale: Fair                              Cognition Arousal/Alertness: Awake/alert Behavior During Therapy: WFL for tasks assessed/performed Overall Cognitive Status: Within Functional Limits for tasks assessed                                          Exercises      General Comments        Pertinent Vitals/Pain Pain Score: 6  Pain Location: low back (chronic) Pain Descriptors / Indicators: Aching Pain Intervention(s): Limited activity within patient's tolerance;Monitored during session;Patient requesting pain meds-RN notified    Home Living                          Prior Function            PT Goals (current goals can now be found in the care plan section) Acute  Rehab PT Goals Patient Stated Goal: [place] PT Goal Formulation: With patient/family Time For Goal Achievement: 04/03/21 Potential to Achieve Goals: Good Progress towards PT goals: Progressing toward goals    Frequency    Min 3X/week      PT Plan Current plan remains appropriate    Co-evaluation              AM-PAC PT "6 Clicks" Mobility   Outcome Measure  Help needed turning from your back to your side while in a flat bed without using bedrails?: A Little Help needed moving from lying on your back to sitting on the side of a flat bed without using bedrails?: A Little Help needed moving to and from a bed to a chair (including a wheelchair)?: None Help  needed standing up from a chair using your arms (e.g., wheelchair or bedside chair)?: None Help needed to walk in hospital room?: A Little Help needed climbing 3-5 steps with a railing? : A Little 6 Click Score: 20    End of Session Equipment Utilized During Treatment: Gait belt Activity Tolerance: Patient limited by fatigue Patient left: in chair;with call bell/phone within reach;with chair alarm set;with family/visitor present Nurse Communication: Mobility status PT Visit Diagnosis: Unsteadiness on feet (R26.81);History of falling (Z91.81);Difficulty in walking, not elsewhere classified (R26.2)     Time: 0923-3007 PT Time Calculation (min) (ACUTE ONLY): 9 min  Charges:  $Gait Training: 8-22 mins                     Blondell Reveal Kistler PT 03/21/2021  Acute Rehabilitation Services Pager [phone] Office [phone]

## 2021-03-21 NOTE — Consult Note (Addendum)
[place] Nurse Consult Note: Reason for Consult: Consult requested for bilat legs.  Pt has patchy areas of partial thickness venous stasis ulcers.  Left leg with few scattered shallow dry round punched out areas; no drainage or depth requiring dressings. Generalized edema to BLE. Right anterior/posterior leg with several wounds; approx 2X2cm, 1X1cm, .8X.8cm, .5X.5cm.  Some areas with dry scabbed wound beds, other with red moist wound beds.  Dressing procedure/placement/frequency: Topical treatment orders provided for bedside nurses to perform as follows to promote moist healing: Apply Bactroban to right leg wounds Q day, then cover with foam dressing.  (Change foam dressing Q 3 days or PRN soiling.) Please re-consult if further assistance is needed.  Thank-you,  Julien Girt MSN, [place], [place], [place], [place]

## 2021-03-22 DIAGNOSIS — J441 Chronic obstructive pulmonary disease with (acute) exacerbation: Secondary | ICD-10-CM | POA: Diagnosis not present

## 2021-03-22 LAB — CREATININE, SERUM
Creatinine, Ser: 1.84 mg/dL — ABNORMAL HIGH (ref 0.44–1.00)
GFR, Estimated: 28 mL/min — ABNORMAL LOW (ref >60)

## 2021-03-22 LAB — GLUCOSE, CAPILLARY: Glucose-Capillary: 231 mg/dL — ABNORMAL HIGH (ref 70–99)

## 2021-03-22 MED ORDER — POLYETHYLENE GLYCOL 3350 17 G PO PACK
17.0000 g | PACK | Freq: Every day | ORAL | 0 refills | Status: DC | PRN
Start: 2021-03-22 — End: 2021-09-01

## 2021-03-22 MED ORDER — CEPHALEXIN 500 MG PO CAPS: 500.0000 mg | ORAL_CAPSULE | Freq: Three times a day (TID) | ORAL | 0 refills | Status: AC

## 2021-03-22 MED ORDER — ALBUTEROL SULFATE (2.5 MG/3ML) 0.083% IN NEBU: 2.5000 mg | INHALATION_SOLUTION | RESPIRATORY_TRACT | 0 refills | Status: DC | PRN

## 2021-03-22 MED ORDER — PREDNISONE 10 MG PO TABS: 40.0000 mg | ORAL_TABLET | Freq: Every day | ORAL | 0 refills | Status: AC

## 2021-03-22 MED ORDER — SACCHAROMYCES BOULARDII 250 MG PO CAPS: 250.0000 mg | ORAL_CAPSULE | Freq: Two times a day (BID) | ORAL | 0 refills | Status: AC

## 2021-03-22 MED ORDER — MELATONIN 3 MG PO TABS: 3.0000 mg | ORAL_TABLET | Freq: Every evening | ORAL | 0 refills | Status: AC | PRN

## 2021-03-22 NOTE — Progress Notes (Signed)
Kayla [name] to be D/C'd Home with home health per MD order.  Discussed with the patient and all questions fully answered.  An After Visit Summary was printed and given to the patient. Patient prescriptions sent to pharmacy.  D/c education completed with patient/family including follow up instructions, medication list, d/c activities limitations if indicated, with other d/c instructions as indicated by MD - patient able to verbalize understanding, all questions fully answered.   Patient instructed to return to ED, call 911, or call MD for any changes in condition.   Patient escorted via [place], and D/C home via private auto.  Manuella Ghazi 03/22/2021 11:07 AM

## 2021-03-22 NOTE — Discharge Summary (Signed)
Discharge Summary  Kayla [name] FEX:[id number] DOB: August 31, 1940  PCP: [name], MD  Admit date: 03/18/2021 Discharge date: 03/22/2021  Time spent: 35 minutes.  Recommendations for Outpatient Follow-up:  Follow-up with your PCP Follow-up with your cardiologist Continue PT OT with assistance and fall precautions.  Discharge Diagnoses:  Active Hospital Problems   Diagnosis Date Noted   COPD with acute exacerbation ([place]) 08/13/2019   Cellulitis of right lower extremity 03/19/2021   CKD (chronic kidney disease), stage III ([place]) 03/19/2021   COPD with exacerbation ([place]) 03/19/2021   Paroxysmal A-fib ([place]) 10/30/2019   Acute respiratory failure with hypoxia ([place]) 08/13/2019   Hyperlipidemia associated with type 2 diabetes mellitus ([place]) 11/24/2008   HYPOTHYROIDISM, POSTSURGICAL 06/04/2008   Insulin dependent type 2 diabetes mellitus ([place]) 03/23/2007   OBSTRUCTIVE SLEEP APNEA 02/16/2007   Hypertension associated with diabetes ([place]) 02/16/2007    Resolved Hospital Problems  No resolved problems to display.    Discharge Condition: Stable  Diet recommendation: Resume previous diet.  Heart healthy carb modified diet.  Vitals:   03/22/21 0532 03/22/21 0941  BP: (!) 166/77 (!) 155/75  Pulse: 76 76  Resp: 17   Temp: (!) 97.4 F (36.3 C)   SpO2: 96%     History of present illness:   Kayla [name] is a 80 y.o. female with medical history significant for paroxysmal atrial fibrillation on Eliquis and amiodarone, HFpEF (last EF 60-65%, G2 DD by TTE 10/03/2020), COPD with chronic bronchitis, CKD stage IIIb, insulin-dependent T2DM, HTN, HLD, hypothyroidism, and OSA on CPAP who presented to [facility] ED for evaluation of cough and worsening shortness of breath.  Work-up revealed acute COPD exacerbation, bronchitis, and acute hypoxic respiratory failure requiring 2 L O2 nasal cannula, for which she was started on azithromycin and IV Solu-Medrol.  Patient endorsed right lower  extremity pain and tenderness on palpation.  On exam her right lower extremity appears edematous, erythematous, warm, and tender to palpation.  She was started on empiric IV antibiotics cefazolin for right lower extremity cellulitis.  She reports she went through 2 rounds of oral antibiotics prior to presentation, prescribed by her primary care provider.  MRSA screen negative.  Hospital course complicated by AKI likely prerenal in the setting of home oral diuretics.  Home Lasix held on 03/20/2021.  Seen by cardiology, signed off, recommended to restart home Demadex.  03/22/2021: Seen and examined at bedside.  Her husband present in the room.  No acute events overnight.  The patient has no new complaints.  She is eager to go home.  Hospital Course:  Principal Problem:   COPD with acute exacerbation (HCC) Active Problems:   HYPOTHYROIDISM, POSTSURGICAL   Insulin dependent type 2 diabetes mellitus ([place])   Hyperlipidemia associated with type 2 diabetes mellitus ([place])   OBSTRUCTIVE SLEEP APNEA   Hypertension associated with diabetes ([place])   Acute respiratory failure with hypoxia (HCC)   Paroxysmal A-fib (HCC)   Cellulitis of right lower extremity   CKD (chronic kidney disease), stage III (HCC)   COPD with exacerbation (HCC)  Acute COPD exacerbation with bronchitis, improving Presented with worsening shortness of breath, productive cough, and wheezing. Personally reviewed 2 view chest x-ray done on admission which showed central airway thickening suspicious for bronchitis. She was started on azithromycin and IV Solu-Medrol 40 mg twice daily, wean off IV Solu-Medrol to prednisone 40 mg daily to start on 03/22/2021 x3 days. Continue bronchodilators.  Completed Z-Pak.   Resolved acute metabolic encephalopathy/delirium in the setting of  her underlying acute illnesses. Episode of delirium 03/20/2021 Continue to regulate sleep cycle with nonpharmacological intervention for delirium Reorient often,  open blinds during the day, out of bed to chair during the day. Melatonin if needed nightly to regulate sleep cycle.   AKI on CKD 3B, suspect prerenal in the setting of intravascular volume depletion Home diuretics held on 03/20/2021, restarted on 03/21/2021 per cardiology recommendation. Creatinine 1.84 with GFR 28 on 03/22/2021. Follow-up with your primary care provider.   Resolved acute hypoxic respiratory failure secondary to COPD exacerbation Oxygen saturation 96% on room air.   Right lower extremity cellulitis, POA Failed outpatient antibiotics therapy x2. Received 4 days of IV Ancef. Continue Keflex 500 mg 3 times daily x7 days. Florastor 250 mg twice daily x10 days. MRSA screening test negative. Blood cultures x2 peripherally, no growth to date..   Type 2 diabetes with hyperglycemia likely exacerbated by IV steroids Last hemoglobin A1c 8.4 on 01/30/2021 Continue home regimen. Follow-up with your PCP.   Anemia of chronic disease Stable with no overt bleeding. Follow-up with your PCP.  Paroxysmal A. fib on Eliquis Continue home regimen. Follow-up with your cardiologist.   Diabetic polyneuropathy Continue home regimen, on gabapentin 800 mg 3 times daily.   Hypertension Continue home regimen.   Hyperlipidemia Continue home Lipitor.   Physical debility PT OT assessed and recommended home PT Mobilize as tolerated Continue fall precautions     Code Status: DNR   Family Communication: Husband at bedside     Consultants: Cardiology consult on 03/21/2021 for management of her cardiac medications.   Procedures: None.   Antimicrobials: Azithromycin Ancef.   DVT prophylaxis: Eliquis     Discharge Exam: BP (!) 155/75 (BP Location: Right Arm, Patient Position: Sitting)   Pulse 76   Temp (!) 97.4 F (36.3 C) (Oral)   Resp 17   Ht _0  (1.651 m)   Wt 110.8 kg   SpO2 96%   BMI 40.65 kg/m  General: 80 y.o. year-old female well developed well  nourished in no acute distress.  Alert and oriented x3. Cardiovascular: Regular rate and rhythm with no rubs or gallops.  No thyromegaly or JVD noted.   Respiratory: Clear to auscultation with no wheezes or rales. Good inspiratory effort. Abdomen: Soft nontender nondistended with normal bowel sounds x4 quadrants. Musculoskeletal: No lower extremity edema. 2/4 pulses in all 4 extremities. Skin: No ulcerative lesions noted or rashes, Psychiatry: Mood is appropriate for condition and setting  Discharge Instructions You were cared for by a hospitalist during your hospital stay. If you have any questions about your discharge medications or the care you received while you were in the hospital after you are discharged, you can call the unit and asked to speak with the hospitalist on call if the hospitalist that took care of you is not available. Once you are discharged, your primary care physician will handle any further medical issues. Please note that NO REFILLS for any discharge medications will be authorized once you are discharged, as it is imperative that you return to your primary care physician (or establish a relationship with a primary care physician if you do not have one) for your aftercare needs so that they can reassess your need for medications and monitor your lab values.   Allergies as of 03/22/2021       Reactions   Dulaglutide Nausea Only, Other (See Comments), Cough   Made the patient sick to her stomach (only) several days after using it Trulicity  Oxycodone Itching   Pt still takes this med even thought it causes itching   Trelegy Ellipta [fluticasone-umeclidin-vilant] Other (See Comments)   unknown        Medication List     TAKE these medications    acetaminophen 500 MG tablet Commonly known as: TYLENOL Take 1,000 mg by mouth every 6 (six) hours as needed for moderate pain (for headaches).   albuterol (2.5 MG/3ML) 0.083% nebulizer solution Commonly known as:  PROVENTIL Take 3 mLs (2.5 mg total) by nebulization every 2 (two) hours as needed for wheezing or shortness of breath. What changed: Another medication with the same name was added. Make sure you understand how and when to take each.   albuterol 108 (90 Base) MCG/ACT inhaler Commonly known as: Ventolin HFA Inhale 2 puffs into the lungs every 6 (six) hours as needed for wheezing or shortness of breath. What changed: Another medication with the same name was added. Make sure you understand how and when to take each.   albuterol (2.5 MG/3ML) 0.083% nebulizer solution Commonly known as: PROVENTIL Take 3 mLs (2.5 mg total) by nebulization every 2 (two) hours as needed for wheezing or shortness of breath. What changed: You were already taking a medication with the same name, and this prescription was added. Make sure you understand how and when to take each.   allopurinol 100 MG tablet Commonly known as: ZYLOPRIM Take 100 mg by mouth 2 (two) times daily.   amiodarone 200 MG tablet Commonly known as: PACERONE Take 1 tablet (200 mg total) by mouth daily.   amLODipine 5 MG tablet Commonly known as: NORVASC Take 1 tablet (5 mg total) by mouth daily.   ARTIFICIAL TEARS OP Place 1 drop into both eyes daily as needed.   ascorbic acid 500 MG tablet Commonly known as: VITAMIN C Take 500 mg by mouth every morning.   atorvastatin 40 MG tablet Commonly known as: LIPITOR TAKE 1 TABLET EVERY DAY   B-D SINGLE USE SWABS REGULAR Pads   benzonatate 200 MG capsule Commonly known as: TESSALON Take 200 mg by mouth 3 (three) times daily as needed for cough.   Breztri Aerosphere 160-9-4.8 MCG/ACT Aero Generic drug: Budeson-Glycopyrrol-Formoterol INHALE 2 PUFFS INTO THE LUNGS IN THE MORNING AND AT BEDTIME.   cephALEXin 500 MG capsule Commonly known as: KEFLEX Take 1 capsule (500 mg total) by mouth 3 (three) times daily for 7 days.   Eliquis 5 MG Tabs tablet Generic drug: apixaban TAKE 1 TABLET  TWICE DAILY What changed: how much to take   ferrous sulfate 325 (65 FE) MG tablet Take 1 tablet (325 mg total) by mouth daily with breakfast.   fluticasone 50 MCG/ACT nasal spray Commonly known as: FLONASE USE 2 SPRAYS NASALLY DAILY What changed:  how much to take how to take this when to take this reasons to take this additional instructions   gabapentin 800 MG tablet Commonly known as: NEURONTIN Take 800 mg by mouth 3 (three) times daily.   HYDROcodone-acetaminophen 5-325 MG tablet Commonly known as: NORCO/VICODIN Take 1 tablet by mouth every 4 (four) hours as needed for moderate pain.   insulin aspart 100 UNIT/ML FlexPen Commonly known as: NOVOLOG Inject 5-15 Units into the skin 3 (three) times daily with meals. Per sliding scale (and an additional 1 unit for every 50 points for a BGL >130) Sliding scale   insulin glargine 100 UNIT/ML injection Commonly known as: LANTUS Inject 110 Units into the skin in the morning and at bedtime.  levothyroxine 112 MCG tablet Commonly known as: SYNTHROID Take 224 mcg by mouth daily.   losartan 50 MG tablet Commonly known as: COZAAR Take 50 mg by mouth daily.   melatonin 3 MG Tabs tablet Take 1 tablet (3 mg total) by mouth at bedtime as needed.   metoprolol succinate 100 MG 24 hr tablet Commonly known as: TOPROL-XL TAKE 1 TABLET EVERY DAY WITH OR IMMEDIATELY AFTER A MEAL What changed: See the new instructions.   naloxone 4 MG/0.1ML Liqd nasal spray kit Commonly known as: NARCAN Place 4 mg into the nose once as needed for opioid reversal.   nitroGLYCERIN 0.4 MG SL tablet Commonly known as: NITROSTAT Place 1 tablet (0.4 mg total) under the tongue every 5 (five) minutes as needed for chest pain.   polyethylene glycol 17 g packet Commonly known as: MIRALAX / GLYCOLAX Take 17 g by mouth daily as needed for mild constipation.   predniSONE 10 MG tablet Commonly known as: DELTASONE Take 4 tablets (40 mg total) by mouth  daily with breakfast for 3 days.   PRESCRIPTION MEDICATION 1 Dose by Other route at bedtime. CPAP: At bedtime and "as needed for shortness of breath"   saccharomyces boulardii 250 MG capsule Commonly known as: Florastor Take 1 capsule (250 mg total) by mouth 2 (two) times daily for 10 days.   torsemide 20 MG tablet Commonly known as: DEMADEX Take 80 mg by mouth daily. What changed: Another medication with the same name was removed. Continue taking this medication, and follow the directions you see here.   True Metrix Blood Glucose Test test strip Generic drug: glucose blood   True Metrix Level 1 Low Soln   TRUEplus Lancets 28G Misc   TUSSIN DM MAX SUGAR-FREE PO Take 5 mLs by mouth every 4 (four) hours as needed (for coughing).   vitamin B-12 500 MCG tablet Commonly known as: CYANOCOBALAMIN Take 500 mcg by mouth daily.   Vitamin D3 25 MCG tablet Commonly known as: Vitamin D Take 1,000 Units by mouth every morning.       Allergies  Allergen Reactions   Dulaglutide Nausea Only, Other (See Comments) and Cough    Made the patient sick to her stomach (only) several days after using it Trulicity   Oxycodone Itching    Pt still takes this med even thought it causes itching   Trelegy Ellipta [Fluticasone-Umeclidin-Vilant] Other (See Comments)    unknown    Follow-up Information     [facility] Follow up.   Specialty: Home Health Services Why: Levada Dy is the main contact, she will be in touch with you within 24 hours to set up the first home health visit. Contact information: [address] [place] [address] [phone]         Hamrick, Lorin Mercy, MD. Call today.   Specialty: Family Medicine Why: please call for a post hospital follow up Contact information: [place] [address] [phone]         Jerline Pain, MD .   Specialty: Cardiology Contact information: [address] [place]  [phone]         Deboraha Sprang, MD .   Specialty: Cardiology Contact information: [phone] [address] [place] [address] [phone]                  The results of significant diagnostics from this hospitalization (including imaging, microbiology, ancillary and laboratory)  are listed below for reference.    Significant Diagnostic Studies: DG Chest 2 View  Result Date: 03/18/2021 CLINICAL DATA:  Shortness of breath. Cough and chest pain for 4 days. EXAM: CHEST - 2 VIEW COMPARISON:  Radiographs 01/31/2021 and CT 11/14/2020. FINDINGS: Patient is mildly rotated to the right on the AP view. The left subclavian pacemaker leads appear unchanged at the level of the right atrium and right ventricle. The heart size and mediastinal contours are stable. There is central airway thickening without confluent airspace opacity, pleural effusion or pneumothorax. Mild degenerative changes are present throughout the spine. Patient is status post lower cervical fusion and thyroidectomy. IMPRESSION: Central airway thickening suspicious for bronchitis. Otherwise stable chest. Electronically Signed   By: Richardean Sale M.D.   On: 03/18/2021 14:40    Microbiology: Recent Results (from the past 240 hour(s))  Resp Panel by RT-PCR (Flu A&B, Covid) Nasopharyngeal Swab     Status: None   Collection Time: 03/18/21  1:33 PM   Specimen: Nasopharyngeal Swab; Nasopharyngeal(NP) swabs in vial transport medium  Result Value Ref Range Status   SARS Coronavirus 2 by RT PCR NEGATIVE NEGATIVE Final    Comment: (NOTE) SARS-CoV-2 target nucleic acids are NOT DETECTED.  The SARS-CoV-2 RNA is generally detectable in upper respiratory specimens during the acute phase of infection. The lowest concentration of SARS-CoV-2 viral copies this assay can detect is 138 copies/mL. A negative result does not preclude SARS-Cov-2 infection and should not be used as the sole basis for treatment  or other patient management decisions. A negative result may occur with  improper specimen collection/handling, submission of specimen other than nasopharyngeal swab, presence of viral mutation(s) within the areas targeted by this assay, and inadequate number of viral copies(<138 copies/mL). A negative result must be combined with clinical observations, patient history, and epidemiological information. The expected result is Negative.  Fact Sheet for Patients:  EntrepreneurPulse.com.au  Fact Sheet for Healthcare Providers:  IncredibleEmployment.be  This test is no t yet approved or cleared by the [place] FDA and  has been authorized for detection and/or diagnosis of SARS-CoV-2 by FDA under an Emergency Use Authorization (EUA). This EUA will remain  in effect (meaning this test can be used) for the duration of the COVID-19 declaration under Section 564(b)(1) of the Act, 21 U.S.C.section 360bbb-3(b)(1), unless the authorization is terminated  or revoked sooner.       Influenza A by PCR NEGATIVE NEGATIVE Final   Influenza B by PCR NEGATIVE NEGATIVE Final    Comment: (NOTE) The Xpert Xpress SARS-CoV-2/FLU/RSV plus assay is intended as an aid in the diagnosis of influenza from Nasopharyngeal swab specimens and should not be used as a sole basis for treatment. Nasal washings and aspirates are unacceptable for Xpert Xpress SARS-CoV-2/FLU/RSV testing.  Fact Sheet for Patients: EntrepreneurPulse.com.au  Fact Sheet for Healthcare Providers: IncredibleEmployment.be  This test is not yet approved or cleared by the [place] FDA and has been authorized for detection and/or diagnosis of SARS-CoV-2 by FDA under an Emergency Use Authorization (EUA). This EUA will remain in effect (meaning this test can be used) for the duration of the COVID-19 declaration under Section 564(b)(1) of the Act, 21 U.S.C. section  360bbb-3(b)(1), unless the authorization is terminated or revoked.  Performed at [facility], [place] [address], [place] , [place] [address]   Culture, blood (routine x 2)     Status: None (Preliminary result)   Collection Time: 03/19/21  4:41 PM   Specimen: Right Antecubital;  Blood  Result Value Ref Range Status   Specimen Description   Final    RIGHT ANTECUBITAL Performed at [facility] [address], [place], [place] [address]    Special Requests   Final    BOTTLES DRAWN AEROBIC ONLY Blood Culture adequate volume Performed at [facility] [address] [place], [place] [address]    Culture   Final    NO GROWTH 3 DAYS Performed at [facility], [place] [address], [place], [place] [address]    Report Status PENDING  Incomplete  Culture, blood (routine x 2)     Status: None (Preliminary result)   Collection Time: 03/19/21  4:41 PM   Specimen: BLOOD RIGHT HAND  Result Value Ref Range Status   Specimen Description   Final    BLOOD RIGHT HAND Performed at [facility] [address], [place], [place] [address]    Special Requests   Final    BOTTLES DRAWN AEROBIC ONLY Blood Culture adequate volume Performed at [facility] [address], [place], [place] [address]    Culture   Final    NO GROWTH 3 DAYS Performed at [facility], [place] [address], [place], [place] [address]    Report Status PENDING  Incomplete  MRSA Next Gen by PCR, Nasal     Status: None   Collection Time: 03/20/21  1:00 AM   Specimen: Nasal Mucosa; Nasal Swab  Result Value Ref Range Status   MRSA by PCR Next Gen NOT DETECTED NOT DETECTED Final    Comment: (NOTE) The GeneXpert MRSA Assay (FDA approved for NASAL specimens only), is one component of a comprehensive MRSA colonization surveillance program. It is not intended to diagnose MRSA infection nor to guide or monitor treatment for MRSA  infections. Test performance is not FDA approved in patients less than 30 years old. Performed at [facility], [place] [address], [place], [place] [address]      Labs: Basic Metabolic Panel: Recent Labs  Lab 03/18/21 1333 03/19/21 0500 03/20/21 0815 03/21/21 0538 03/22/21 0537  NA 135 138 137 138  --   K 4.9 4.6 4.2 3.9  --   CL 98 101 99 102  --   CO2 _0 --   GLUCOSE 279* 183* 270* 194*  --   BUN 38* 37* 57* 67*  --   CREATININE 1.48* 1.61* 1.86* 1.89* 1.84*  CALCIUM 9.6 9.1 8.9 8.7*  --    Liver Function Tests: No results for input(s): AST, ALT, ALKPHOS, BILITOT, PROT, ALBUMIN in the last 168 hours. No results for input(s): LIPASE, AMYLASE in the last 168 hours. No results for input(s): AMMONIA in the last 168 hours. CBC: Recent Labs  Lab 03/18/21 1333 03/19/21 0500 03/20/21 0815 03/21/21 0538  WBC 15.8* 15.7* 15.8* 15.5*  NEUTROABS 12.8*  --   --   --   HGB 10.8* 10.0* 10.9* 10.0*  HCT 34.9* 31.5* 34.9* 32.1*  MCV 94.6 92.1 92.3 92.0  PLT 388 362 450* 438*   Cardiac Enzymes: No results for input(s): CKTOTAL, CKMB, CKMBINDEX, TROPONINI in the last 168 hours. BNP: BNP (last 3 results) Recent Labs    03/20/21 0815  BNP 293.1*    ProBNP (last 3 results) Recent Labs    09/24/20 1128  PROBNP 562    CBG: Recent Labs  Lab 03/21/21 0812 03/21/21 1328 03/21/21 1705 03/21/21 2124 03/22/21 0716  GLUCAP 207*  335* 209* 394* 231*       Signed:  Kayleen Memos, MD [facility] 03/22/2021, 6:53 PM

## 2021-03-22 NOTE — TOC Transition Note (Addendum)
Transition of Care [facility]) - CM/SW Discharge Note   Patient Details  Name: MARVALENE BARRETT MRN: [id number] Date of Birth: 1941-02-05  [facility] CM/SW Contact:  Ross Ludwig, LCSW Phone Number: 03/22/2021, 9:52 AM   Clinical Narrative:     CSW was informed that patient needed home health services.  CSW spoke to patient's husband to ask if they had any preferences, and he said no.  CSW also asked if they needed any equipment, and he said they already have everything they need.    CSW spoke to [address] [facility], and she is able to accept patient for home health services.  Patient will be going home with home health through [facility].  CSW signing off please reconsult with any other social work needs, home health agency has been notified of planned discharge.    Final next level of care: [place] Barriers to Discharge: Barriers Resolved   Patient Goals and CMS Choice Patient states their goals for this hospitalization and ongoing recovery are:: To return back home with home health services. CMS Medicare.gov Compare Post Acute Care list provided to:: Patient Represenative (must comment) Choice offered to / list presented to : Spouse  Discharge Placement                       Discharge Plan and Services                          HH Arranged: OT, PT [facility] Agency: [facility] [place] Date [facility] Agency Contacted: 03/22/21 Time [place]: [phone] Representative spoke with at [place]: [place] ([place]) Interventions     Readmission Risk Interventions Readmission Risk Prevention Plan 03/21/2021  Transportation Screening Complete  Medication Review Press photographer) Complete  Some recent data might be hidden

## 2021-03-24 ENCOUNTER — Other Ambulatory Visit: Payer: Self-pay

## 2021-03-24 DIAGNOSIS — N1832 Chronic kidney disease, stage 3b: Secondary | ICD-10-CM | POA: Diagnosis not present

## 2021-03-24 DIAGNOSIS — J441 Chronic obstructive pulmonary disease with (acute) exacerbation: Secondary | ICD-10-CM

## 2021-03-24 LAB — CULTURE, BLOOD (ROUTINE X 2)
Culture: NO GROWTH
Culture: NO GROWTH
Special Requests: ADEQUATE
Special Requests: ADEQUATE

## 2021-03-25 ENCOUNTER — Other Ambulatory Visit: Payer: Self-pay

## 2021-03-25 NOTE — Patient Outreach (Signed)
[facility]) Care Management  03/25/2021  Kayla [name] November 17, 1940 [id number]   Referral Date: 03/24/21 Referral Source: Hospital Liaison Date of Discharge:03/22/21 Facility: [place]: [facility]  Outreach attempt: No answer.  HIPAA compliant voice message left.    Plan: RN CM will attempt within 4 business days and send letter.   [name], RN, MSN [facility] Care Management Care Management Coordinator Direct Line [phone] Toll Free: [phone]  Fax: [phone]

## 2021-03-26 ENCOUNTER — Other Ambulatory Visit: Payer: Self-pay

## 2021-03-26 DIAGNOSIS — M1710 Unilateral primary osteoarthritis, unspecified knee: Secondary | ICD-10-CM | POA: Diagnosis not present

## 2021-03-26 DIAGNOSIS — Z1389 Encounter for screening for other disorder: Secondary | ICD-10-CM | POA: Diagnosis not present

## 2021-03-26 DIAGNOSIS — Z79891 Long term (current) use of opiate analgesic: Secondary | ICD-10-CM | POA: Diagnosis not present

## 2021-03-26 DIAGNOSIS — M549 Dorsalgia, unspecified: Secondary | ICD-10-CM | POA: Diagnosis not present

## 2021-03-26 DIAGNOSIS — M8580 Other specified disorders of bone density and structure, unspecified site: Secondary | ICD-10-CM | POA: Diagnosis not present

## 2021-03-26 DIAGNOSIS — E349 Endocrine disorder, unspecified: Secondary | ICD-10-CM | POA: Diagnosis not present

## 2021-03-26 DIAGNOSIS — M353 Polymyalgia rheumatica: Secondary | ICD-10-CM | POA: Diagnosis not present

## 2021-03-26 DIAGNOSIS — G894 Chronic pain syndrome: Secondary | ICD-10-CM | POA: Diagnosis not present

## 2021-03-26 NOTE — Patient Outreach (Signed)
[facility]) Care Management  [place]  03/26/2021   Kayla [name] 02-15-1941 [id number]  Subjective: Telephone call to patient. She reports she is better.  She reports she had a bad cold and being that she had a history of COPD they kept her in the hospital a few days. .  Discussed her COPD and management.  Patient states she normally manages well and calls physician for any problems.  [facility] has been in contact with patient and will call again on Friday to set up time for a visit per patient request.    Patient able to bath and dress but husband washes her back and helps her step out of the bath.  Patient and spouse still drives and live in their home. Patient has appointment with PCP on Monday.  No transportation problems.   Past medical History per chart review: History significant for paroxysmal atrial fibrillation on Eliquis and amiodarone, HFpEF (last EF 60-65%, G2 DD by TTE 10/03/2020), COPD with chronic bronchitis, CKD stage IIIb, insulin-dependent T2DM, HTN, HLD, hypothyroidism, and OSA on CPAP.  Discussed [facility] Care Management and support. Patient agreeable to RN phone calls at this time.    Objective:   Encounter Medications:  Outpatient Encounter Medications as of 03/26/2021  Medication Sig Note   acetaminophen (TYLENOL) 500 MG tablet Take 1,000 mg by mouth every 6 (six) hours as needed for moderate pain (for headaches).     albuterol (PROVENTIL) (2.5 MG/3ML) 0.083% nebulizer solution Take 3 mLs (2.5 mg total) by nebulization every 2 (two) hours as needed for wheezing or shortness of breath.    albuterol (VENTOLIN HFA) 108 (90 Base) MCG/ACT inhaler Inhale 2 puffs into the lungs every 6 (six) hours as needed for wheezing or shortness of breath.    amiodarone (PACERONE) 200 MG tablet Take 1 tablet (200 mg total) by mouth daily.    amLODipine (NORVASC) 5 MG tablet Take 1 tablet (5 mg total) by mouth daily.    ascorbic acid (VITAMIN C) 500  MG tablet Take 500 mg by mouth every morning.    atorvastatin (LIPITOR) 40 MG tablet TAKE 1 TABLET EVERY DAY (Patient taking differently: Take 40 mg by mouth daily.)    benzonatate (TESSALON) 200 MG capsule Take 200 mg by mouth 3 (three) times daily as needed for cough.    Blood Glucose Calibration (TRUE METRIX LEVEL 1) Low SOLN     BREZTRI AEROSPHERE 160-9-4.8 MCG/ACT AERO INHALE 2 PUFFS INTO THE LUNGS IN THE MORNING AND AT BEDTIME.    Carboxymethylcellulose Sodium (ARTIFICIAL TEARS OP) Place 1 drop into both eyes daily as needed.    cephALEXin (KEFLEX) 500 MG capsule Take 1 capsule (500 mg total) by mouth 3 (three) times daily for 7 days.    Dextromethorphan-guaiFENesin (TUSSIN DM MAX SUGAR-FREE PO) Take 5 mLs by mouth every 4 (four) hours as needed (for coughing).    ELIQUIS 5 MG TABS tablet TAKE 1 TABLET TWICE DAILY (Patient taking differently: Take 5 mg by mouth 2 (two) times daily.)    ferrous sulfate 325 (65 FE) MG tablet Take 1 tablet (325 mg total) by mouth daily with breakfast.    fluticasone (FLONASE) 50 MCG/ACT nasal spray USE 2 SPRAYS NASALLY DAILY (Patient taking differently: Place 2 sprays into both nostrils daily as needed for allergies or rhinitis.)    gabapentin (NEURONTIN) 800 MG tablet Take 800 mg by mouth 3 (three) times daily.    HYDROcodone-acetaminophen (NORCO/VICODIN) 5-325 MG tablet Take 1  tablet by mouth every 4 (four) hours as needed for moderate pain.    insulin aspart (NOVOLOG) 100 UNIT/ML FlexPen Inject 5-15 Units into the skin 3 (three) times daily with meals. Per sliding scale (and an additional 1 unit for every 50 points for a BGL >130) Sliding scale    insulin glargine (LANTUS) 100 UNIT/ML injection Inject 110 Units into the skin in the morning and at bedtime.    levothyroxine (SYNTHROID) 112 MCG tablet Take 224 mcg by mouth daily.    losartan (COZAAR) 50 MG tablet Take 50 mg by mouth daily.    melatonin 3 MG TABS tablet Take 1 tablet (3 mg total) by mouth at  bedtime as needed.    metoprolol succinate (TOPROL-XL) 100 MG 24 hr tablet TAKE 1 TABLET EVERY DAY WITH OR IMMEDIATELY AFTER A MEAL (Patient taking differently: Take 100 mg by mouth daily.)    naloxone (NARCAN) nasal spray 4 mg/0.1 mL Place 4 mg into the nose once as needed for opioid reversal. 03/18/2021: Never used   nitroGLYCERIN (NITROSTAT) 0.4 MG SL tablet Place 1 tablet (0.4 mg total) under the tongue every 5 (five) minutes as needed for chest pain. 02/10/2021: Patient has on hand if needed   polyethylene glycol (MIRALAX / GLYCOLAX) 17 g packet Take 17 g by mouth daily as needed for mild constipation.    PRESCRIPTION MEDICATION 1 Dose by Other route at bedtime. CPAP: At bedtime and "as needed for shortness of breath"    saccharomyces boulardii (FLORASTOR) 250 MG capsule Take 1 capsule (250 mg total) by mouth 2 (two) times daily for 10 days.    torsemide (DEMADEX) 20 MG tablet Take 80 mg by mouth daily.    TRUE METRIX BLOOD GLUCOSE TEST test strip     TRUEplus Lancets 28G MISC     vitamin B-12 (CYANOCOBALAMIN) 500 MCG tablet Take 500 mcg by mouth daily.    Vitamin D3 (VITAMIN D) 25 MCG tablet Take 1,000 Units by mouth every morning.    albuterol (PROVENTIL) (2.5 MG/3ML) 0.083% nebulizer solution Take 3 mLs (2.5 mg total) by nebulization every 2 (two) hours as needed for wheezing or shortness of breath.    Alcohol Swabs (B-D SINGLE USE SWABS REGULAR) PADS     allopurinol (ZYLOPRIM) 100 MG tablet Take 100 mg by mouth 2 (two) times daily.    No facility-administered encounter medications on file as of 03/26/2021.    Functional Status:  In your present state of health, do you have any difficulty performing the following activities: 03/26/2021 03/19/2021  Hearing? N N  Vision? N N  Difficulty concentrating or making decisions? N N  Walking or climbing stairs? Y Y  Comment secondary to body aches and cough secondary to body aches and cough  Dressing or bathing? Y Y  Comment secondary to  body aches and cough secondary to body aches and cough  Doing errands, shopping? N N  Preparing Food and eating ? N -  Using the Toilet? N -  Do you have problems with loss of bowel control? N -  Managing your Medications? Y -  Comment spouse helps -  Managing your Finances? Y -  Comment spouse does with her -  Housekeeping or managing your Housekeeping? Y -  Comment family helps -  Some recent data might be hidden    Fall/Depression Screening: Fall Risk  03/26/2021  Falls in the past year? 1  Number falls in past yr: 0  Injury with Fall? 1  Risk  for fall due to : History of fall(s)  Follow up Education provided   [facility] 2/9 Scores 03/26/2021  PHQ - 2 Score 0    Assessment:   Care Plan Care Plan : RN Care Manager Plan of [place]  Updates made by Jon Billings, RN since 03/26/2021 12:00 AM     Problem: Chronic Disease Management and Care Coordination for COPD      Long-Range Goal: Development of Plan of Care for COPD   Start Date: 03/26/2021  Expected End Date: 05/03/2022  Priority: High  Note:   Current Barriers:  Chronic Disease Management support and education needs related to COPD  RNCM Clinical Goal(s):  Patient will verbalize understanding of plan for management of COPD as evidenced by No exacerbation of COPD take all medications exactly as prescribed and will call provider for medication related questions as evidenced by patient reports of medication compliance    attend all scheduled medical appointments: Appointment with PCP scheduled 03/31/21 as evidenced by patient reporting attendance        continue to work with RN Care Manager and/or Social Worker to address care management and care coordination needs related to COPD as evidenced by adherence to CM Team Scheduled appointments     through collaboration with Consulting civil engineer, provider, and care team.   Interventions: Education provided to patient on COPD Inter-disciplinary care team collaboration (see  longitudinal plan of care) Evaluation of current treatment plan related to  self management and patient's adherence to plan as established by provider Encouraged patient to pace self with activity. Reviewed importance of medication compliance. Reviewed when to call for physician for increased shortness of breath, increased sputum, fatigue that is not his normal, cough, fever or sick contacts.  Reviewed COPD Red Zone:  Symptoms What to do  Red Zone- I need urgent medical care *Severe shortness of breath even at rest *Severe shortness of breath even after quick relief inhaler or nebulizer *Not able to do any activity because of breathing *Fever or shaking chills *Feeling confused or very drowsy *Chest pains *Coughing up blood *Quick relief inhaler or nebulizer not effective *Call 911 or have someone take you to the nearest emergency room  *Call provider now or same day appointment     Patient Goals/Self-Care Activities: Take medications as prescribed   Attend all scheduled provider appointments Call provider office for new concerns or questions  - follow rescue plan if symptoms flare-up - keep follow-up appointments: Patient to see PCP on 03/31/21        Goals Addressed   None     Plan: RN CM will provide ongoing education and support to patient through phone calls.   RN CM will send welcome packet with consent to patient.   RN CM will send initial barriers letter, assessment, and care plan to primary care physician.   Follow-up: Patient agrees to Care Plan and Follow-up. Follow-up in 2-3 week(s)  [name], RN, MSN [place] Management Care Management Coordinator Direct Line [phone] Cell [phone] Toll Free: [phone]  Fax: [phone]

## 2021-03-26 NOTE — Patient Instructions (Signed)
Patient Goals/Self-Care Activities: Take medications as prescribed   Attend all scheduled provider appointments Call provider office for new concerns or questions  - follow rescue plan if symptoms flare-up - keep follow-up appointments: Patient to see PCP on 03/31/21

## 2021-03-28 ENCOUNTER — Other Ambulatory Visit: Payer: Self-pay | Admitting: Physician Assistant

## 2021-03-31 DIAGNOSIS — L03115 Cellulitis of right lower limb: Secondary | ICD-10-CM | POA: Diagnosis not present

## 2021-03-31 DIAGNOSIS — J441 Chronic obstructive pulmonary disease with (acute) exacerbation: Secondary | ICD-10-CM | POA: Diagnosis not present

## 2021-03-31 DIAGNOSIS — Z79899 Other long term (current) drug therapy: Secondary | ICD-10-CM | POA: Diagnosis not present

## 2021-03-31 DIAGNOSIS — Z6839 Body mass index (BMI) 39.0-39.9, adult: Secondary | ICD-10-CM | POA: Diagnosis not present

## 2021-03-31 DIAGNOSIS — J029 Acute pharyngitis, unspecified: Secondary | ICD-10-CM | POA: Diagnosis not present

## 2021-03-31 DIAGNOSIS — Z7689 Persons encountering health services in other specified circumstances: Secondary | ICD-10-CM | POA: Diagnosis not present

## 2021-03-31 DIAGNOSIS — R059 Cough, unspecified: Secondary | ICD-10-CM | POA: Diagnosis not present

## 2021-04-10 ENCOUNTER — Other Ambulatory Visit: Payer: Self-pay

## 2021-04-10 NOTE — Patient Outreach (Signed)
[place] [facility]) Care Management  04/10/2021  Kayla [name] 04-05-1941 [id number]   Telephone call to patient for follow up. No answer.  HIPAA compliant voice message left.    Plan: RN CM will attempt patient again within 4 business days.    [name], RN, MSN [place] Management Care Management Coordinator Direct Line [phone] Cell [phone] Toll Free: [phone]  Fax: [phone]

## 2021-04-14 ENCOUNTER — Other Ambulatory Visit: Payer: Self-pay

## 2021-04-14 NOTE — Patient Outreach (Signed)
[place] [facility]) Care Management  04/14/2021  Kayla [name] Oct 24, 1940 [id number]   Telephone call to patient for follow up.  No answer.  HIPAA compliant voice message left.    Plan: RN CM will attempt patient again within 4 business days and send letter.    [name], RN, MSN [place] Management Care Management Coordinator Direct Line [phone] Cell [phone] Toll Free: [phone]  Fax: [phone]

## 2021-04-17 ENCOUNTER — Other Ambulatory Visit: Payer: Self-pay

## 2021-04-17 ENCOUNTER — Other Ambulatory Visit: Payer: Self-pay | Admitting: Pulmonary Disease

## 2021-04-17 NOTE — Patient Instructions (Signed)
Patient Goals/Self-Care Activities: COPD Take medications as prescribed   Call provider office for new concerns or questions  - follow rescue plan if symptoms flare-up - use devices that will help like a cane, sock-puller or reacher

## 2021-04-17 NOTE — Patient Outreach (Signed)
[place] [facility]) Care Management  [place]  04/17/2021   Kayla [name] Feb 24, 1941 [id number]  Subjective: Telephone call to patient for follow up. Patient reports she is doing good.  She reports her breathing is good. Discussed rescue plan for COPD.  Patient able to describe how she handles episodes of shortness of breath.  No concerns.     Objective:   Encounter Medications:  Outpatient Encounter Medications as of 04/17/2021  Medication Sig Note   acetaminophen (TYLENOL) 500 MG tablet Take 1,000 mg by mouth every 6 (six) hours as needed for moderate pain (for headaches).     albuterol (PROVENTIL) (2.5 MG/3ML) 0.083% nebulizer solution Take 3 mLs (2.5 mg total) by nebulization every 2 (two) hours as needed for wheezing or shortness of breath.    albuterol (PROVENTIL) (2.5 MG/3ML) 0.083% nebulizer solution Take 3 mLs (2.5 mg total) by nebulization every 2 (two) hours as needed for wheezing or shortness of breath.    albuterol (VENTOLIN HFA) 108 (90 Base) MCG/ACT inhaler Inhale 2 puffs into the lungs every 6 (six) hours as needed for wheezing or shortness of breath.    Alcohol Swabs (B-D SINGLE USE SWABS REGULAR) PADS     allopurinol (ZYLOPRIM) 100 MG tablet Take 100 mg by mouth 2 (two) times daily.    amiodarone (PACERONE) 200 MG tablet Take 1 tablet (200 mg total) by mouth daily.    amLODipine (NORVASC) 5 MG tablet Take 1 tablet (5 mg total) by mouth daily.    ascorbic acid (VITAMIN C) 500 MG tablet Take 500 mg by mouth every morning.    atorvastatin (LIPITOR) 40 MG tablet TAKE 1 TABLET EVERY DAY (Patient taking differently: Take 40 mg by mouth daily.)    benzonatate (TESSALON) 200 MG capsule Take 200 mg by mouth 3 (three) times daily as needed for cough.    Blood Glucose Calibration (TRUE METRIX LEVEL 1) Low SOLN     BREZTRI AEROSPHERE 160-9-4.8 MCG/ACT AERO INHALE 2 PUFFS INTO THE LUNGS IN THE MORNING AND AT BEDTIME.    Carboxymethylcellulose Sodium  (ARTIFICIAL TEARS OP) Place 1 drop into both eyes daily as needed.    Dextromethorphan-guaiFENesin (TUSSIN DM MAX SUGAR-FREE PO) Take 5 mLs by mouth every 4 (four) hours as needed (for coughing).    ELIQUIS 5 MG TABS tablet TAKE 1 TABLET TWICE DAILY (Patient taking differently: Take 5 mg by mouth 2 (two) times daily.)    ferrous sulfate 325 (65 FE) MG tablet Take 1 tablet (325 mg total) by mouth daily with breakfast.    fluticasone (FLONASE) 50 MCG/ACT nasal spray USE 2 SPRAYS NASALLY DAILY (Patient taking differently: Place 2 sprays into both nostrils daily as needed for allergies or rhinitis.)    gabapentin (NEURONTIN) 800 MG tablet Take 800 mg by mouth 3 (three) times daily.    HYDROcodone-acetaminophen (NORCO/VICODIN) 5-325 MG tablet Take 1 tablet by mouth every 4 (four) hours as needed for moderate pain.    insulin aspart (NOVOLOG) 100 UNIT/ML FlexPen Inject 5-15 Units into the skin 3 (three) times daily with meals. Per sliding scale (and an additional 1 unit for every 50 points for a BGL >130) Sliding scale    insulin glargine (LANTUS) 100 UNIT/ML injection Inject 110 Units into the skin in the morning and at bedtime.    levothyroxine (SYNTHROID) 112 MCG tablet Take 224 mcg by mouth daily.    losartan (COZAAR) 50 MG tablet Take 50 mg by mouth daily.    melatonin 3 MG  TABS tablet Take 1 tablet (3 mg total) by mouth at bedtime as needed.    metoprolol succinate (TOPROL-XL) 100 MG 24 hr tablet TAKE 1 TABLET EVERY DAY WITH OR IMMEDIATELY AFTER A MEAL (Patient taking differently: Take 100 mg by mouth daily.)    naloxone (NARCAN) nasal spray 4 mg/0.1 mL Place 4 mg into the nose once as needed for opioid reversal. 03/18/2021: Never used   nitroGLYCERIN (NITROSTAT) 0.4 MG SL tablet Place 1 tablet (0.4 mg total) under the tongue every 5 (five) minutes as needed for chest pain. 02/10/2021: Patient has on hand if needed   polyethylene glycol (MIRALAX / GLYCOLAX) 17 g packet Take 17 g by mouth daily as  needed for mild constipation.    PRESCRIPTION MEDICATION 1 Dose by Other route at bedtime. CPAP: At bedtime and "as needed for shortness of breath"    torsemide (DEMADEX) 20 MG tablet Take 80 mg by mouth daily.    TRUE METRIX BLOOD GLUCOSE TEST test strip     TRUEplus Lancets 28G MISC     vitamin B-12 (CYANOCOBALAMIN) 500 MCG tablet Take 500 mcg by mouth daily.    Vitamin D3 (VITAMIN D) 25 MCG tablet Take 1,000 Units by mouth every morning.    No facility-administered encounter medications on file as of 04/17/2021.    Functional Status:  In your present state of health, do you have any difficulty performing the following activities: 03/26/2021 03/19/2021  Hearing? N N  Vision? N N  Difficulty concentrating or making decisions? N N  Walking or climbing stairs? Y Y  Comment secondary to body aches and cough secondary to body aches and cough  Dressing or bathing? Y Y  Comment secondary to body aches and cough secondary to body aches and cough  Doing errands, shopping? N N  Preparing Food and eating ? N -  Using the Toilet? N -  In the past six months, have you accidently leaked urine? Y -  Comment wears pads -  Do you have problems with loss of bowel control? N -  Managing your Medications? Y -  Comment spouse helps -  Managing your Finances? Y -  Comment spouse does with her -  Housekeeping or managing your Housekeeping? Y -  Comment family helps -  Some recent data might be hidden    Fall/Depression Screening: Fall Risk  03/26/2021  Falls in the past year? 1  Number falls in past yr: 0  Injury with Fall? 1  Risk for fall due to : History of fall(s)  Follow up Education provided   [facility] 2/9 Scores 03/26/2021  PHQ - 2 Score 0    Assessment:   Care Plan Care Plan : [place]  Updates made by Jon Billings, RN since 04/17/2021 12:00 AM     Problem: Chronic Disease Management and Care Coordination for COPD      Long-Range Goal: Development of Plan  of Care for COPD   Start Date: 03/26/2021  Expected End Date: 05/03/2022  This Visit's Progress: On track  Priority: High  Note:   Current Barriers:  Knowledge Deficits related to plan of care for management of COPD  Chronic Disease Management support and education needs related to COPD  RNCM Clinical Goal(s):  Patient will verbalize understanding of plan for management of COPD as evidenced by No exacerbation of COPD take all medications exactly as prescribed and will call provider for medication related questions as evidenced by patient reports of medication  compliance    continue to work with Consulting civil engineer and/or Social Worker to address care management and care coordination needs related to COPD as evidenced by adherence to CM Team Scheduled appointments     through collaboration with Consulting civil engineer, provider, and care team.   Interventions: Education provided to patient on COPD Inter-disciplinary care team collaboration (see longitudinal plan of care) Evaluation of current treatment plan related to  self management and patient's adherence to plan as established by provider Encouraged patient to pace self with activity. Reviewed importance of medication compliance. Reviewed when to call for physician for increased shortness of breath, increased sputum, fatigue that is not his normal, cough, fever or sick contacts.   04/17/21 Reviewed COPD Yellow Zone:                                                 Symptoms What to do  Yellow Zone *Increased weight (3 lbs in one day, 5 lbs in a week)  *Increased cough *Increased swelling of ankles and/or feet *Increased shortness of breath with activity   *Chest pain   *Increased number of pillows needed to sleep or need to sleep in a chair *Anything else unusual that bothers you *Call your doctor TODAY if you are in the yellow zone.      Patient Goals/Self-Care Activities: COPD Take medications as prescribed   Call provider office for new  concerns or questions  - follow rescue plan if symptoms flare-up - use devices that will help like a cane, sock-puller or reacher   Follow Up Plan:  Telephone follow up appointment with care management team member scheduled for:  4-6 weeks The patient has been provided with contact information for the care management team and has been advised to call with any health related questions or concerns.          Goals Addressed   None     Plan:  Follow-up: Patient agrees to Care Plan and Follow-up. Follow-up in 4-6 week(s)   [name], RN, MSN [place] Management Care Management Coordinator Direct Line [phone] Cell [phone] Toll Free: [phone]  Fax: [phone]

## 2021-04-24 DIAGNOSIS — M1710 Unilateral primary osteoarthritis, unspecified knee: Secondary | ICD-10-CM | POA: Diagnosis not present

## 2021-04-24 DIAGNOSIS — M8580 Other specified disorders of bone density and structure, unspecified site: Secondary | ICD-10-CM | POA: Diagnosis not present

## 2021-04-24 DIAGNOSIS — Z79891 Long term (current) use of opiate analgesic: Secondary | ICD-10-CM | POA: Diagnosis not present

## 2021-04-24 DIAGNOSIS — M549 Dorsalgia, unspecified: Secondary | ICD-10-CM | POA: Diagnosis not present

## 2021-04-24 DIAGNOSIS — G894 Chronic pain syndrome: Secondary | ICD-10-CM | POA: Diagnosis not present

## 2021-04-24 DIAGNOSIS — M353 Polymyalgia rheumatica: Secondary | ICD-10-CM | POA: Diagnosis not present

## 2021-04-24 DIAGNOSIS — E349 Endocrine disorder, unspecified: Secondary | ICD-10-CM | POA: Diagnosis not present

## 2021-04-24 DIAGNOSIS — Z1389 Encounter for screening for other disorder: Secondary | ICD-10-CM | POA: Diagnosis not present

## 2021-04-29 DIAGNOSIS — H353132 Nonexudative age-related macular degeneration, bilateral, intermediate dry stage: Secondary | ICD-10-CM | POA: Diagnosis not present

## 2021-04-29 DIAGNOSIS — E113213 Type 2 diabetes mellitus with mild nonproliferative diabetic retinopathy with macular edema, bilateral: Secondary | ICD-10-CM | POA: Diagnosis not present

## 2021-04-29 DIAGNOSIS — H26493 Other secondary cataract, bilateral: Secondary | ICD-10-CM | POA: Diagnosis not present

## 2021-04-29 DIAGNOSIS — H35033 Hypertensive retinopathy, bilateral: Secondary | ICD-10-CM | POA: Diagnosis not present

## 2021-05-13 DIAGNOSIS — M25551 Pain in right hip: Secondary | ICD-10-CM | POA: Diagnosis not present

## 2021-05-14 ENCOUNTER — Ambulatory Visit: Payer: Medicare HMO | Admitting: Nurse Practitioner

## 2021-05-14 NOTE — Progress Notes (Deleted)
Cardiology Office Note:    Date:  05/14/2021   ID:  Kayla [name], DOB 1940/11/12, MRN [id number]  PCP:  [name], MD   [facility] Cardiologist:  Candee Furbish, MD Electrophysiologist:  Virl Axe, MD {  Referring MD: [name], MD   No chief complaint on file. ***  History of Present Illness:    Kayla [name] is a 81 y.o. female with a hx of chronic HFpEF, CAD, HTN, PAF on chronic anticoagulation and amiodarone, sinus node dysfunction with PPM placement, carotid artery disease, COPD, GERD, and OSA.  Her cardiac history is significant for pacemaker implant in 2017 for sinus node dysfunction. She had mild nonobstructive CAD by cath in 2006 and a low risk myoview in 2013, and PAF with failure of dofetilide. At an office visit on 01/30/21 with [name], PA, she was found to be volume up with dypsnea and was admitted for IV diuresis. She was discharged on 02/01/21 and weight was down 4 lbs and one week follow-up was recommended.   She was last seen in our office by [name], NP on 02/10/21. She was given antibiotic for cellulitis of RLE and recommended to follow-up in 1 month.   On 03/18/21, she presented to [facility] for evaluation of 5-day history of worsening SOB and productive cough. Her SPO2 was 87-90% and with concern for pneumonia, she was instructed to go to the ED.  She was admitted for acute respiratory failure with hypoxia, COPD exacerbation, bronchitis, and RLE cellulitis. During her hospitalization, cardiology was consulted for input regarding diuretic therapy in the setting of acute kidney injury. She had mild volume overload, and chest pain with a normal EKG felt to be r/t bronchitis.  She was advised to continue Demadex with a baseline creatinine of 1.9.  Metolazone was discontinued.  Today, she is here    Past Medical History:  Diagnosis Date   A-fib [facility])    Allergy    Anxiety    Arthritis    CAD (coronary artery disease)     Mild per remote cath in 2006, /    Nuclear, January, 2013, no significant abnormality,   Carotid artery disease ([place])    Doppler, January, 2013, 0 79% RIC A., 02-40% LICA, stable   Cataract    CHF (congestive heart failure) ([place])    Complication of anesthesia    wakes up "mean"   COPD 02/16/2007   Intolerant of Spiriva, tudorza Intolerant of Trelegy    COPD (chronic obstructive pulmonary disease) ([place])    Coronary atherosclerosis 11/24/2008   Catheterization 2006, nonobstructive coronary disease   //   nuclear 2013, low risk     Diabetes mellitus, type 2 ([place])    Diverticula of colon    DIVERTICULOSIS OF COLON 03/23/2007   Qualifier: Diagnosis of  By: Halford Chessman MD, Vineet     Dizziness    occasional   Dizzy spells 05/04/2011   DM (diabetes mellitus) ([place]) 03/23/2007   Qualifier: Diagnosis of  By: Halford Chessman MD, Vineet     Dyspnea    On exertion   Ejection fraction    Essential hypertension 02/16/2007   Qualifier: Diagnosis of  By: Rosana Hoes CMA, Tammy     G E R D 03/23/2007   Qualifier: Diagnosis of  By: Halford Chessman MD, Vineet     Gall bladder disease 04/28/2016   Gastritis and gastroduodenitis    GERD (gastroesophageal reflux disease)    Goiter    hx of  Headache(784.0)    migraine   Hyperlipidemia    Hypertension    Hypothyroidism    HYPOTHYROIDISM, POSTSURGICAL 06/04/2008   Qualifier: Diagnosis of  By: Loanne Drilling MD, Sean A    Left ventricular hypertrophy    Leg edema    occasional swelling   Leg swelling 12/09/2018   Myofascial pain syndrome, cervical 01/06/2018   Obesity    OBESITY 11/24/2008   Qualifier: Diagnosis of  By: Jaramillo, [place] 02/16/2007   Auto CPAP 09/03/13 to 10/02/13 >> used on 30 of 30 nights with average 6 hrs and 3 min.  Average AHI is 3.1 with median CPAP 5 cm H2O and 95 th percentile CPAP 6 cm H2O.     Occipital neuralgia of right side 01/06/2018   OSA (obstructive sleep apnea)    cpap setting of 12   Pain in joint of right hip 01/21/2018    Palpitations 01/21/2011   48 hour Holter, 2015, scattered PACs and PVCs.    Pneumonia    PONV (postoperative nausea and vomiting)    severe after every surgery   Pulmonary hypertension, secondary    RHINITIS 07/21/2010        RUQ abdominal pain    S/P left TKA 07/27/2011   Sinus node dysfunction ([place]) 11/08/2015   Sleep apnea    Spinal stenosis of cervical region 11/07/2014   Urinary frequency 12/09/2018   UTI (urinary tract infection) 08/15/2019   per pt report   VENTRICULAR HYPERTROPHY, LEFT 11/24/2008   Qualifier: Diagnosis of  By: Sidney Ace      Past Surgical History:  Procedure Laterality Date   ABDOMINAL HYSTERECTOMY     with appendectomy and colon polypectomy   AMPUTATION TOE Left 12/15/2019   Procedure: AMPUTATION  LEFT GREAT TOE;  Surgeon: Evelina Bucy, DPM;  Location: [facility];  Service: Podiatry;  Laterality: Left;   ANTERIOR CERVICAL DECOMP/DISCECTOMY FUSION N/A 11/07/2014   Procedure: Anterior Cervical diskectomy and fusion Cervical four-five, Cervical five-six, Cervical six-seven;  Surgeon: Kary Kos, MD;  Location: [facility];  Service: Neurosurgery;  Laterality: N/A;   APPENDECTOMY     ARTERY BIOPSY Right 02/16/2017   Procedure: BIOPSY TEMPORAL ARTERY;  Surgeon: Katha Cabal, MD;  Location: [facility] ORS;  Service: Vascular;  Laterality: Right;   ARTERY BIOPSY Left 04/16/2020   Procedure: BIOPSY TEMPORAL ARTERY;  Surgeon: Katha Cabal, MD;  Location: [facility] ORS;  Service: Vascular;  Laterality: Left;  LEFT TEMPLE   BACK SURGERY     CARDIAC CATHETERIZATION     CATARACT EXTRACTION W/ INTRAOCULAR LENS  IMPLANT, BILATERAL Bilateral    CHOLECYSTECTOMY N/A 04/29/2016   Procedure: LAPAROSCOPIC CHOLECYSTECTOMY WITH INTRAOPERATIVE CHOLANGIOGRAM;  Surgeon: Alphonsa Overall, MD;  Location: [facility];  Service: General;  Laterality: N/A;   EP IMPLANTABLE DEVICE N/A 11/08/2015   MRI COMPATABLE -- Procedure: Pacemaker Implant;  Surgeon: Deboraha Sprang, MD;  Location: [place] [facility];  Service: Cardiovascular;  Laterality: N/A;   INSERT / REPLACE / REMOVE PACEMAKER  2017   Dr. Jens Som [facility])   JOINT REPLACEMENT     LUMBAR [place] SURGERY  1990's   x 2   THYROIDECTOMY  2011   TOTAL KNEE ARTHROPLASTY  07/27/2011   Procedure: TOTAL KNEE ARTHROPLASTY;  Surgeon: Mauri Pole, MD;  Location: [facility];  Service: Orthopedics;  Laterality: Left;   UPPER ESOPHAGEAL ENDOSCOPIC ULTRASOUND (EUS) N/A 05/14/2016   Procedure: UPPER ESOPHAGEAL ENDOSCOPIC ULTRASOUND (EUS);  Surgeon: Quillian Quince  Merrily Brittle, MD;  Location: [facility];  Service: Endoscopy;  Laterality: N/A;  radial only-will be done mod if needed    Current Medications: No outpatient medications have been marked as taking for the 05/14/21 encounter (Appointment) with Ann Maki, Lanice Schwab, NP.     Allergies:   Dulaglutide, Oxycodone, and Trelegy ellipta [fluticasone-umeclidin-vilant]   Social History   Socioeconomic History   Marital status: Married    Spouse name: Hallee Mckenny   Number of children: 3   Years of education: 12   Highest education level: Not on file  Occupational History   Occupation: retired  Tobacco Use   Smoking status: Former    Packs/day: 2.00    Years: 33.00    Pack years: 66.00    Types: Cigarettes    Start date: 1966    Quit date: 05/04/1997    Years since quitting: 24.0   Smokeless tobacco: Never  Vaping Use   Vaping Use: Never used  Substance and Sexual Activity   Alcohol use: No   Drug use: No   Sexual activity: Not on file  Other Topics Concern   Not on file  Social History Narrative   Patient lives at home with her husband Jeneen Rinks .   Retired.   Education 12 th grade.   Right handed   Caffeine two cups of coffee.   Social Determinants of Health   Financial Resource Strain: Low Risk    Difficulty of Paying Living Expenses: Not very hard  Food Insecurity: No Food Insecurity   Worried About Charity fundraiser in the Last Year: Never true   Ran Out of Food in the Last Year:  Never true  Transportation Needs: No Transportation Needs   Lack of Transportation (Medical): No   Lack of Transportation (Non-Medical): No  Physical Activity: Not on file  Stress: Not on file  Social Connections: Not on file     Family History: The patient's ***family history includes Diabetes in her maternal aunt and maternal uncle; Heart Problems in her father and mother; Heart defect in her mother; Heart disease in her brother; Lung cancer in her daughter; Throat cancer in her brother. There is no history of Colon cancer, Colon polyps, Esophageal cancer, Rectal cancer, or Stomach cancer.  ROS:   Please see the history of present illness.    *** All other systems reviewed and are negative.  Labs/Other Studies Reviewed:    The following studies were reviewed today:  Echo 10/03/20  Left Ventricle: Left ventricular ejection fraction, by estimation, is 60  to 65%. The left ventricle has normal function. The left ventricle has no  regional wall motion abnormalities. The global longitudinal strain is  normal. The left ventricular internal cavity size was normal in size. There is severe concentric left ventricular hypertrophy. Left ventricular diastolic parameters are  consistent with Grade II diastolic dysfunction (pseudonormalization).  Right Ventricle: The right ventricular size is normal. No increase in  right ventricular wall thickness. Right ventricular systolic function is  normal. There is normal pulmonary artery systolic pressure. The tricuspid  regurgitant velocity is 1.66 m/s, and  with an assumed right atrial pressure of 3 mmHg, the estimated right ventricular systolic pressure is 76.7 mmHg.  Left Atrium: Left atrial size was normal in size.  Right Atrium: Right atrial size was normal in size.  Pericardium: There is no evidence of pericardial effusion.  Mitral Valve: The mitral valve is grossly normal. No evidence of mitral  valve regurgitation.  Tricuspid Valve:  The  tricuspid valve is grossly normal. Tricuspid valve  regurgitation is trivial.  Aortic Valve: The aortic valve is tricuspid. Aortic valve regurgitation is  not visualized. No aortic stenosis is present.  Pulmonic Valve: The pulmonic valve was not well visualized. Pulmonic valve  regurgitation is not visualized. No evidence of pulmonic stenosis.  Aorta: The aortic root and ascending aorta are structurally normal, with  no evidence of dilitation.  IAS/Shunts: The atrial septum is grossly normal.     Recent Labs: 09/24/2020: Magnesium 2.0; NT-Pro BNP 562 01/15/2021: TSH 0.524 01/30/2021: ALT 15 03/20/2021: B Natriuretic Peptide 293.1 03/21/2021: BUN 67; Hemoglobin 10.0; Platelets 438; Potassium 3.9; Sodium 138 03/22/2021: Creatinine, Ser 1.84  Recent Lipid Panel No results found for: CHOL, TRIG, HDL, CHOLHDL, VLDL, LDLCALC, LDLDIRECT   Risk Assessment/Calculations:   {Does this patient have ATRIAL FIBRILLATION?[phone]}       Physical Exam:    VS:  There were no vitals taken for this visit.    Wt Readings from Last 3 Encounters:  03/19/21 244 lb 4.3 oz (110.8 kg)  02/10/21 242 lb 6.4 oz (110 kg)  02/01/21 242 lb 15.2 oz (110.2 kg)     GEN: *** Well nourished, well developed in no acute distress HEENT: Normal NECK: No JVD; No carotid bruits LYMPHATICS: No lymphadenopathy CARDIAC: ***RRR, no murmurs, rubs, gallops RESPIRATORY:  Clear to auscultation without rales, wheezing or rhonchi  ABDOMEN: Soft, non-tender, non-distended MUSCULOSKELETAL:  No edema; No deformity  SKIN: Warm and dry NEUROLOGIC:  Alert and oriented x 3 PSYCHIATRIC:  Normal affect   EKG:  EKG is *** ordered today.  The ekg ordered today demonstrates ***  Diagnoses:    1. Chronic anticoagulation   2. PAF (paroxysmal atrial fibrillation) ([place])   3. Pacemaker - MDT   4. Essential hypertension    Assessment and Plan:     ***      {Are you ordering a CV Procedure (e.g. stress test, cath, DCCV,  TEE, etc)?   Press F2        :[id number]}    Medication Adjustments/Labs and Tests Ordered: Current medicines are reviewed at length with the patient today.  Concerns regarding medicines are outlined above.  No orders of the defined types were placed in this encounter.  No orders of the defined types were placed in this encounter.   There are no Patient Instructions on file for this visit.   Signed, Emmaline Life, NP  05/14/2021 8:27 AM    [facility]

## 2021-05-19 ENCOUNTER — Other Ambulatory Visit: Payer: Self-pay

## 2021-05-19 ENCOUNTER — Ambulatory Visit: Payer: Self-pay

## 2021-05-19 NOTE — Patient Outreach (Signed)
[facility]) Care Management  05/19/2021  Kayla [name] [birth year] [id number]   Telephone Assessment  Successful outreach call placed to patient. She reports she has not been doing well. She complains of ongoing right hip/back pain. She saw PCP last week and has bene referred to specialist. She is taking Tylenol prn. She reports her breathing has been at baseline and no issues noted. Appetite remains good. Wgt stable. She denies any RN CM needs or concerns at this time.     Medications Reviewed Today     Reviewed by Hayden Pedro, RN (Registered Nurse) on 05/19/21 at [phone]  Med List Status: <None>   Medication Order Taking? Sig Documenting Provider Last Dose Status Informant  acetaminophen (TYLENOL) 500 MG tablet [id number] No Take 1,000 mg by mouth every 6 (six) hours as needed for moderate pain (for headaches).  [provider] Taking Active Self  albuterol (PROVENTIL) (2.5 MG/3ML) 0.083% nebulizer solution [id number] No Take 3 mLs (2.5 mg total) by nebulization every 2 (two) hours as needed for wheezing or shortness of breath. Chesley Mires, MD Taking Active Self  albuterol (PROVENTIL) (2.5 MG/3ML) 0.083% nebulizer solution [id number]  Take 3 mLs (2.5 mg total) by nebulization every 2 (two) hours as needed for wheezing or shortness of breath. Irene Pap N, DO  Active   albuterol (PROVENTIL) (2.5 MG/3ML) 0.083% nebulizer solution [id number]  INHALE THE CONTENTS OF 1 VIAL VIA NEBULIZER EVERY 2 HOURS AS NEEDED FOR WHEEZE, SHORTNESS OF BREATH. MAX 4 VIALS/DAY Chesley Mires, MD  Active   albuterol (VENTOLIN HFA) 108 (90 Base) MCG/ACT inhaler [id number] No Inhale 2 puffs into the lungs every 6 (six) hours as needed for wheezing or shortness of breath. Tanda Rockers, MD Taking Active Self  Alcohol Swabs (B-D SINGLE USE SWABS REGULAR) PADS [id number] No  [provider] Taking Active Self  allopurinol (ZYLOPRIM) 100 MG tablet [id number] No Take 100 mg  by mouth 2 (two) times daily. [provider] 03/18/2021 Active Self  amiodarone (PACERONE) 200 MG tablet [id number] No Take 1 tablet (200 mg total) by mouth daily. Deboraha Sprang, MD Taking Active Self  amLODipine (NORVASC) 5 MG tablet [id number] No Take 1 tablet (5 mg total) by mouth daily. Jerline Pain, MD Taking Active Self  ascorbic acid (VITAMIN C) 500 MG tablet [id number] No Take 500 mg by mouth every morning. [provider] Taking Active Self  atorvastatin (LIPITOR) 40 MG tablet [id number] No TAKE 1 TABLET EVERY DAY  Patient taking differently: Take 40 mg by mouth daily.   Jerline Pain, MD Taking Active Self  benzonatate (TESSALON) 200 MG capsule [id number] No Take 200 mg by mouth 3 (three) times daily as needed for cough. [provider] Taking Active Self  Blood Glucose Calibration (TRUE METRIX LEVEL 1) Low SOLN [id number] No  [provider] Taking Active Self  BREZTRI AEROSPHERE 160-9-4.8 MCG/ACT AERO [id number] No INHALE 2 PUFFS INTO THE LUNGS IN THE MORNING AND AT BEDTIME. Chesley Mires, MD Taking Active Self  Carboxymethylcellulose Sodium (ARTIFICIAL TEARS OP) [id number] No Place 1 drop into both eyes daily as needed. [provider] Taking Active Self  Dextromethorphan-guaiFENesin (TUSSIN DM MAX SUGAR-FREE PO) [id number] No Take 5 mLs by mouth every 4 (four) hours as needed (for coughing). [provider] Taking Active Self  ELIQUIS 5 MG TABS tablet [id number] No TAKE 1 TABLET TWICE DAILY  Patient taking differently: Take 5 mg by mouth 2 (two) times daily.   Deboraha Sprang,  MD Taking Active Self  ferrous sulfate 325 (65 FE) MG tablet [id number] No Take 1 tablet (325 mg total) by mouth daily with breakfast. Deboraha Sprang, MD Taking Active Self  fluticasone (FLONASE) 50 MCG/ACT nasal spray [id number] No USE 2 SPRAYS NASALLY DAILY  Patient taking differently: Place 2 sprays into both nostrils daily as needed for allergies or rhinitis.    Collene Gobble, MD Taking Active Self  gabapentin (NEURONTIN) 800 MG tablet [id number] No Take 800 mg by mouth 3 (three) times daily. [provider] Taking Active Self  HYDROcodone-acetaminophen (NORCO/VICODIN) 5-325 MG tablet [id number] No Take 1 tablet by mouth every 4 (four) hours as needed for moderate pain. [provider] Taking Active Self  insulin aspart (NOVOLOG) 100 UNIT/ML FlexPen [id number] No Inject 5-15 Units into the skin 3 (three) times daily with meals. Per sliding scale (and an additional 1 unit for every 50 points for a BGL >130) Sliding scale [provider] Taking Active Self           Med Note Broadus John, REGEENA   Tue Mar 18, 2021  9:33 PM)    insulin glargine (LANTUS) 100 UNIT/ML injection [id number] No Inject 110 Units into the skin in the morning and at bedtime. [provider] Taking Active Self           Med Note Tamala Julian, JEFFREY W   Tue Oct 03, 2019  1:50 AM)    levothyroxine (SYNTHROID) 112 MCG tablet [id number] No Take 224 mcg by mouth daily. [provider] Taking Active Self           Med Note Broadus John, REGEENA   Tue Mar 18, 2021  9:37 PM)    losartan (COZAAR) 50 MG tablet [id number] No Take 50 mg by mouth daily. [provider] Taking Active Self  metoprolol succinate (TOPROL-XL) 100 MG 24 hr tablet [id number] No TAKE 1 TABLET EVERY DAY WITH OR IMMEDIATELY AFTER A MEAL  Patient taking differently: Take 100 mg by mouth daily.   Deboraha Sprang, MD Taking Active Self  naloxone [facility]) nasal spray 4 mg/0.1 mL [id number] No Place 4 mg into the nose once as needed for opioid reversal. [provider] Taking Active Self           Med Note Broadus John, REGEENA   Tue Mar 18, 2021  9:39 PM) Never used  nitroGLYCERIN (NITROSTAT) 0.4 MG SL tablet [id number] No Place 1 tablet (0.4 mg total) under the tongue every 5 (five) minutes as needed for chest pain. Jerline Pain, MD Taking Active Self           Med Note Mauri Reading Feb 10, 2021  8:49 AM) Patient has on hand if needed  polyethylene glycol (MIRALAX / GLYCOLAX) 17 g packet [id number] No Take 17 g by mouth daily as needed for mild constipation. Kayleen Memos, [place] Taking Active   PRESCRIPTION MEDICATION [id number] No 1 Dose by Other route at bedtime. CPAP: At bedtime and "as needed for shortness of breath" [provider] Taking Active Self  torsemide (DEMADEX) 20 MG tablet [id number] No Take 80 mg by mouth daily. [provider] Taking Active Self  TRUE METRIX BLOOD GLUCOSE TEST test strip [id number] No  [provider] Taking Active Self  TRUEplus Lancets 28G [place] [id number] No  [provider] Taking Active Self  vitamin B-12 (CYANOCOBALAMIN) 500 MCG tablet [id number] No Take 500 mcg by mouth daily. [provider] Taking Active Self  Vitamin D3 (VITAMIN D) 25 MCG tablet [id number] No Take 1,000 Units by mouth every morning. [provider] Taking Active Self            Care Plan : RN Care Manager Plan of [place]  Updates made by Hayden Pedro, RN since 05/19/2021 12:00 AM     Problem: Chronic Disease Management and Care Coordination for COPD      Long-Range Goal: Development of Plan of Care for COPD   Start Date: 03/26/2021  Expected End Date: 05/03/2022  This Visit's Progress: On track  Recent Progress: On track  Priority: High  Note:   Current Barriers:  Knowledge Deficits related to plan of care for management of COPD  Chronic Disease Management support and education needs related to COPD  RNCM Clinical Goal(s):  Patient will verbalize understanding of plan for management of COPD as evidenced by No exacerbation of COPD take all medications exactly as prescribed and will call provider for medication related questions as evidenced by patient reports of medication compliance    continue to work with RN Care Manager and/or Social Worker to address care management and care  coordination needs related to COPD as evidenced by adherence to CM Team Scheduled appointments     through collaboration with RN Care manager, provider, and care team.   Interventions: Education provided to patient on COPD Inter-disciplinary care team collaboration (see longitudinal plan of care) Evaluation of current treatment plan related to  self management and patient's adherence to plan as established by provider Encouraged patient to pace self with activity. Reviewed importance of medication compliance. Reviewed when to call for physician for increased shortness of breath, increased sputum, fatigue that is not his normal, cough, fever or sick contacts.   04/17/21 Reviewed COPD Yellow Zone:                                                 Symptoms What to do  Yellow Zone *Increased weight (3 lbs in one day, 5 lbs in a week)  *Increased cough *Increased swelling of ankles and/or feet *Increased shortness of breath with activity   *Chest pain   *Increased number of pillows needed to sleep or need to sleep in a chair *Anything else unusual that bothers you *Call your doctor TODAY if you are in the yellow zone.  05/19/21-Patient reports breathing has been at baseline and no new issues or concerns related to that. She continues to adhere to resp tx mgmt and using nebs and inhaler as ordered.    Patient Goals/Self-Care Activities: COPD Take medications as prescribed   Call provider office for new concerns or questions  - follow rescue plan if symptoms flare-up - use devices that will help like a cane, sock-puller or reacher   Follow Up Plan:  Telephone follow up appointment with care management team member scheduled for:  within the month of Feb The patient has been provided with contact information for the care management team and has been advised to call with any health related questions or concerns.        Long-Range Goal: Development of POC for Mgmt of Chronic Condition-Chronic  Pain   Start Date: 05/19/2021  Expected End Date: 05/19/2022  Priority: High  Note:   Current Barriers:  Knowledge Deficits related to plan of care for management of chronic pain  RNCM Clinical Goal(s):  Patient will verbalize understanding of plan for management of chronic pain as evidenced by reporting acceptable level of pain take all medications exactly as prescribed and will call provider for medication related questions as evidenced by med adherence/compliance continue to work with RN Care Manager to address care management and care coordination needs related to  chronic pain as evidenced by adherence to CM Team Scheduled appointments through collaboration with RN Care manager, provider, and care team.   Interventions: POC sent to PCP upon initial assessment, quarterly and with any changes in patient's conditions Inter-disciplinary care team collaboration (see longitudinal plan of care) Evaluation of current treatment plan related to  self management and patient's adherence to plan as established by provider   Pain Interventions:  (Status:  New goal.) Long Term Goal Pain assessment performed Medications reviewed Reviewed provider established plan for pain management Discussed importance of adherence to all scheduled medical appointments Counseled on the importance of reporting any/all new or changed pain symptoms or management strategies to pain management provider Advised patient to report to care team affect of pain on daily activities Reviewed with patient prescribed pharmacological and nonpharmacological pain relief strategies  05/19/2021-Patient reports she has been having ongoing right hip/back pain which appears to be worsening. She saw MD last week and been referred to a specialist. Patient taking Tylenol prn for pain. She has stronger pain med but does not like to take it. She reports her mobility has been limited due to pain. Spouse assisting her in the home.   Patient  Goals/Self-Care Activities: Take all medications as prescribed Attend all scheduled provider appointments Call provider office for new concerns or questions   Follow Up Plan:  Telephone follow up appointment with care management team member scheduled for:  within the month of Feb The patient has been provided with contact information for the care management team and has been advised to call with any health related questions or concerns.         Plan: Assigned RN CM discussed with patient next outreach within the month of Feb. Patient agrees to care plan and follow up.  Enzo Montgomery, RN,BSN,CCM [place] Management Telephonic Care Management Coordinator Direct Phone: [phone] Toll Free: [phone] Fax: [phone]

## 2021-05-22 DIAGNOSIS — M25551 Pain in right hip: Secondary | ICD-10-CM | POA: Diagnosis not present

## 2021-05-22 DIAGNOSIS — M25561 Pain in right knee: Secondary | ICD-10-CM | POA: Diagnosis not present

## 2021-05-27 DIAGNOSIS — Z1389 Encounter for screening for other disorder: Secondary | ICD-10-CM | POA: Diagnosis not present

## 2021-05-27 DIAGNOSIS — G894 Chronic pain syndrome: Secondary | ICD-10-CM | POA: Diagnosis not present

## 2021-05-27 DIAGNOSIS — M8580 Other specified disorders of bone density and structure, unspecified site: Secondary | ICD-10-CM | POA: Diagnosis not present

## 2021-05-27 DIAGNOSIS — Z79891 Long term (current) use of opiate analgesic: Secondary | ICD-10-CM | POA: Diagnosis not present

## 2021-05-27 DIAGNOSIS — E349 Endocrine disorder, unspecified: Secondary | ICD-10-CM | POA: Diagnosis not present

## 2021-05-27 DIAGNOSIS — M1710 Unilateral primary osteoarthritis, unspecified knee: Secondary | ICD-10-CM | POA: Diagnosis not present

## 2021-05-27 DIAGNOSIS — M353 Polymyalgia rheumatica: Secondary | ICD-10-CM | POA: Diagnosis not present

## 2021-05-27 DIAGNOSIS — M549 Dorsalgia, unspecified: Secondary | ICD-10-CM | POA: Diagnosis not present

## 2021-05-28 DIAGNOSIS — R609 Edema, unspecified: Secondary | ICD-10-CM | POA: Diagnosis not present

## 2021-05-28 DIAGNOSIS — I129 Hypertensive chronic kidney disease with stage 1 through stage 4 chronic kidney disease, or unspecified chronic kidney disease: Secondary | ICD-10-CM | POA: Diagnosis not present

## 2021-05-28 DIAGNOSIS — E669 Obesity, unspecified: Secondary | ICD-10-CM | POA: Diagnosis not present

## 2021-05-28 DIAGNOSIS — Z0000 Encounter for general adult medical examination without abnormal findings: Secondary | ICD-10-CM | POA: Diagnosis not present

## 2021-05-28 DIAGNOSIS — E785 Hyperlipidemia, unspecified: Secondary | ICD-10-CM | POA: Diagnosis not present

## 2021-05-28 DIAGNOSIS — N2581 Secondary hyperparathyroidism of renal origin: Secondary | ICD-10-CM | POA: Diagnosis not present

## 2021-05-28 DIAGNOSIS — Z6838 Body mass index (BMI) 38.0-38.9, adult: Secondary | ICD-10-CM | POA: Diagnosis not present

## 2021-05-28 DIAGNOSIS — D631 Anemia in chronic kidney disease: Secondary | ICD-10-CM | POA: Diagnosis not present

## 2021-05-28 DIAGNOSIS — N1832 Chronic kidney disease, stage 3b: Secondary | ICD-10-CM | POA: Diagnosis not present

## 2021-05-28 DIAGNOSIS — R809 Proteinuria, unspecified: Secondary | ICD-10-CM | POA: Diagnosis not present

## 2021-05-28 DIAGNOSIS — Z9181 History of falling: Secondary | ICD-10-CM | POA: Diagnosis not present

## 2021-05-28 DIAGNOSIS — Z1331 Encounter for screening for depression: Secondary | ICD-10-CM | POA: Diagnosis not present

## 2021-05-29 ENCOUNTER — Encounter (HOSPITAL_COMMUNITY): Payer: Self-pay | Admitting: Orthopedic Surgery

## 2021-05-29 ENCOUNTER — Encounter: Payer: Self-pay | Admitting: Internal Medicine

## 2021-05-29 ENCOUNTER — Other Ambulatory Visit: Payer: Self-pay

## 2021-05-29 DIAGNOSIS — M25551 Pain in right hip: Secondary | ICD-10-CM | POA: Diagnosis not present

## 2021-05-29 DIAGNOSIS — M25561 Pain in right knee: Secondary | ICD-10-CM | POA: Diagnosis not present

## 2021-05-29 DIAGNOSIS — M71161 Other infective bursitis, right knee: Secondary | ICD-10-CM | POA: Diagnosis not present

## 2021-05-29 NOTE — Progress Notes (Signed)
[facility] DEVICE PROGRAMMING  Patient Information: Name:  Kayla [name]  DOB:  [birth year]  MRN:  [id number]    Sherryl Manges, RN  P Cv Div Heartcare Device Planned Procedure:  [name] BURSA  Surgeon:  Dr. Paralee Cancel  Date of Procedure:  05/30/21  Cautery will be used.  Position during surgery:  Supine   Please send documentation back to:  [facility] (Fax # [phone])   Marnette Burgess, RN  05/29/2021 4:10 PM Device Information:  Clinic EP Physician:  Virl Axe, MD   Device Type:  Pacemaker Manufacturer and Phone #:  Medtronic: [id number] Pacemaker Dependent?:  No. Date of Last Device Check:  01/30/21 Normal Device Function?:  Yes.    Electrophysiologist's Recommendations:  Have magnet available. Provide continuous ECG monitoring when magnet is used or reprogramming is to be performed.  Procedure should not interfere with device function.  No device programming or magnet placement needed.  Per Device Clinic Standing Orders, [name], [place]  4:39 PM 05/29/2021

## 2021-05-29 NOTE — Progress Notes (Signed)
For Short Stay: COVID SWAB appointment date: will arrive at [facility] for testing in Short Stay Date of COVID positive in last 90 days: N/A   For Anesthesia: PCP - [name], MD [facility] [place] Cardiologist: Dr. Candee Furbish last office visit note 02/10/21 by Phill Myron. [place] DNP Electrophysiologist: Dr. Virl Axe [facility]  Chest x-ray - 03/18/21 in epic EKG - 03/18/21 in epic Stress Test -  ECHO - 09/27/20 in care everywhere Cardiac Cath -  Pacemaker/ICD device last checked: 01/30/21 in epic Pacemaker orders received: Requested Device Rep notified:  Spinal Cord Stimulator: N/A  Sleep Study - Yes CPAP - Yes  Fasting Blood Sugar - 112-115 Checks Blood Sugar __2-3___ times a day Date and result of last Hgb A1c-  Blood Thinner Instructions: Eliquis took morning dose 05/29/21 Aspirin Instructions: N/A Last Dose: N/A  Activity level: Unable to go up a flight of stairs without chest pain and/or shortness of breath     Anesthesia review: CAD, Pulm HTN, insulin dependent DM, OSA, COPD, Afib, CHF, ICD, CKD  Patient denies shortness of breath, fever, cough and chest pain at PAT appointment   Patient verbalized understanding of instructions that were given to them at the PAT appointment. Patient was also instructed that they will need to review over the PAT instructions again at home before surgery.

## 2021-05-30 ENCOUNTER — Observation Stay (HOSPITAL_COMMUNITY)
Admission: RE | Admit: 2021-05-30 | Discharge: 2021-05-31 | Disposition: A | Discharge status: Home / Self Care | Payer: Medicare HMO | Attending: Orthopedic Surgery | Admitting: Orthopedic Surgery

## 2021-05-30 ENCOUNTER — Other Ambulatory Visit: Payer: Self-pay

## 2021-05-30 ENCOUNTER — Ambulatory Visit (HOSPITAL_COMMUNITY): Payer: Medicare HMO | Admitting: Physician Assistant

## 2021-05-30 ENCOUNTER — Encounter (HOSPITAL_COMMUNITY): Payer: Self-pay | Admitting: Orthopedic Surgery

## 2021-05-30 ENCOUNTER — Encounter (HOSPITAL_COMMUNITY)
Admission: RE | Disposition: A | Discharge status: Home / Self Care | Payer: Self-pay | Source: Home / Self Care | Attending: Orthopedic Surgery

## 2021-05-30 DIAGNOSIS — Z01818 Encounter for other preprocedural examination: Secondary | ICD-10-CM

## 2021-05-30 DIAGNOSIS — I509 Heart failure, unspecified: Secondary | ICD-10-CM | POA: Insufficient documentation

## 2021-05-30 DIAGNOSIS — I480 Paroxysmal atrial fibrillation: Secondary | ICD-10-CM | POA: Diagnosis not present

## 2021-05-30 DIAGNOSIS — G8918 Other acute postprocedural pain: Secondary | ICD-10-CM | POA: Diagnosis not present

## 2021-05-30 DIAGNOSIS — Z87891 Personal history of nicotine dependence: Secondary | ICD-10-CM | POA: Insufficient documentation

## 2021-05-30 DIAGNOSIS — I2510 Atherosclerotic heart disease of native coronary artery without angina pectoris: Secondary | ICD-10-CM | POA: Diagnosis not present

## 2021-05-30 DIAGNOSIS — Z79899 Other long term (current) drug therapy: Secondary | ICD-10-CM | POA: Diagnosis not present

## 2021-05-30 DIAGNOSIS — Z20822 Contact with and (suspected) exposure to covid-19: Secondary | ICD-10-CM | POA: Insufficient documentation

## 2021-05-30 DIAGNOSIS — I130 Hypertensive heart and chronic kidney disease with heart failure and stage 1 through stage 4 chronic kidney disease, or unspecified chronic kidney disease: Secondary | ICD-10-CM | POA: Diagnosis not present

## 2021-05-30 DIAGNOSIS — Z96652 Presence of left artificial knee joint: Secondary | ICD-10-CM | POA: Diagnosis not present

## 2021-05-30 DIAGNOSIS — M71161 Other infective bursitis, right knee: Secondary | ICD-10-CM | POA: Diagnosis not present

## 2021-05-30 DIAGNOSIS — E1122 Type 2 diabetes mellitus with diabetic chronic kidney disease: Secondary | ICD-10-CM | POA: Insufficient documentation

## 2021-05-30 DIAGNOSIS — M7041 Prepatellar bursitis, right knee: Secondary | ICD-10-CM | POA: Diagnosis not present

## 2021-05-30 DIAGNOSIS — J449 Chronic obstructive pulmonary disease, unspecified: Secondary | ICD-10-CM | POA: Insufficient documentation

## 2021-05-30 DIAGNOSIS — E039 Hypothyroidism, unspecified: Secondary | ICD-10-CM | POA: Diagnosis not present

## 2021-05-30 DIAGNOSIS — Z794 Long term (current) use of insulin: Secondary | ICD-10-CM | POA: Insufficient documentation

## 2021-05-30 DIAGNOSIS — Z7901 Long term (current) use of anticoagulants: Secondary | ICD-10-CM | POA: Insufficient documentation

## 2021-05-30 DIAGNOSIS — E119 Type 2 diabetes mellitus without complications: Secondary | ICD-10-CM

## 2021-05-30 DIAGNOSIS — I10 Essential (primary) hypertension: Secondary | ICD-10-CM | POA: Diagnosis not present

## 2021-05-30 DIAGNOSIS — N189 Chronic kidney disease, unspecified: Secondary | ICD-10-CM | POA: Insufficient documentation

## 2021-05-30 HISTORY — DX: Paroxysmal atrial fibrillation: I48.0

## 2021-05-30 HISTORY — PX: INCISION AND DRAINAGE ABSCESS: SHX5864

## 2021-05-30 HISTORY — DX: Chronic kidney disease, unspecified: N18.9

## 2021-05-30 LAB — CBC
HCT: 35.4 % — ABNORMAL LOW (ref 36.0–46.0)
Hemoglobin: 11.1 g/dL — ABNORMAL LOW (ref 12.0–15.0)
MCH: 29.4 pg (ref 26.0–34.0)
MCHC: 31.4 g/dL (ref 30.0–36.0)
MCV: 93.7 fL (ref 80.0–100.0)
Platelets: 517 10*3/uL — ABNORMAL HIGH (ref 150–400)
RBC: 3.78 MIL/uL — ABNORMAL LOW (ref 3.87–5.11)
RDW: 15.9 % — ABNORMAL HIGH (ref 11.5–15.5)
WBC: 15.9 10*3/uL — ABNORMAL HIGH (ref 4.0–10.5)
nRBC: 0 % (ref 0.0–0.2)

## 2021-05-30 LAB — BASIC METABOLIC PANEL
Anion gap: 9 (ref 5–15)
BUN: 52 mg/dL — ABNORMAL HIGH (ref 8–23)
CO2: 24 mmol/L (ref 22–32)
Calcium: 9.2 mg/dL (ref 8.9–10.3)
Chloride: 105 mmol/L (ref 98–111)
Creatinine, Ser: 1.49 mg/dL — ABNORMAL HIGH (ref 0.44–1.00)
GFR, Estimated: 35 mL/min — ABNORMAL LOW (ref >60)
Glucose, Bld: 109 mg/dL — ABNORMAL HIGH (ref 70–99)
Potassium: 4.2 mmol/L (ref 3.5–5.1)
Sodium: 138 mmol/L (ref 135–145)

## 2021-05-30 LAB — HEMOGLOBIN A1C
Hgb A1c MFr Bld: 8.4 % — ABNORMAL HIGH (ref 4.8–5.6)
Mean Plasma Glucose: 194.38 mg/dL

## 2021-05-30 LAB — SARS CORONAVIRUS 2 BY RT PCR (HOSPITAL ORDER, PERFORMED IN ~~LOC~~ HOSPITAL LAB): SARS Coronavirus 2: NEGATIVE

## 2021-05-30 LAB — TYPE AND SCREEN
ABO/RH(D): B POS
Antibody Screen: NEGATIVE

## 2021-05-30 LAB — GLUCOSE, CAPILLARY
Glucose-Capillary: 113 mg/dL — ABNORMAL HIGH (ref 70–99)
Glucose-Capillary: 70 mg/dL (ref 70–99)
Glucose-Capillary: 161 mg/dL — ABNORMAL HIGH (ref 70–99)

## 2021-05-30 SURGERY — INCISION AND DRAINAGE, ABSCESS
Anesthesia: General | Site: Knee | Laterality: Right

## 2021-05-30 MED ORDER — METOCLOPRAMIDE HCL 5 MG/ML IJ SOLN: 5.0000 mg | Freq: Three times a day (TID) | INTRAMUSCULAR | Status: DC | PRN

## 2021-05-30 MED ORDER — AMIODARONE HCL 200 MG PO TABS
200.0000 mg | ORAL_TABLET | Freq: Every day | ORAL | Status: DC
  Administered 2021-05-31: 200 mg via ORAL
  Filled 2021-05-30 (×2): qty 1

## 2021-05-30 MED ORDER — CLINDAMYCIN HCL 150 MG PO CAPS
150.0000 mg | ORAL_CAPSULE | Freq: Three times a day (TID) | ORAL | Status: DC
  Administered 2021-05-30 – 2021-05-31 (×2): 150 mg via ORAL
  Filled 2021-05-30 (×2): qty 1

## 2021-05-30 MED ORDER — DEXAMETHASONE SODIUM PHOSPHATE 10 MG/ML IJ SOLN
INTRAMUSCULAR | Status: AC
  Filled 2021-05-30: qty 1

## 2021-05-30 MED ORDER — PROPOFOL 10 MG/ML IV BOLUS
INTRAVENOUS | Status: DC | PRN
  Administered 2021-05-30: 100 mg via INTRAVENOUS

## 2021-05-30 MED ORDER — ALLOPURINOL 100 MG PO TABS
100.0000 mg | ORAL_TABLET | Freq: Two times a day (BID) | ORAL | Status: DC
  Administered 2021-05-30 – 2021-05-31 (×2): 100 mg via ORAL
  Filled 2021-05-30 (×3): qty 1

## 2021-05-30 MED ORDER — INSULIN ASPART 100 UNIT/ML IJ SOLN
0.0000 [IU] | Freq: Three times a day (TID) | INTRAMUSCULAR | Status: DC
  Administered 2021-05-31: 11 [IU] via SUBCUTANEOUS
  Administered 2021-05-31: 8 [IU] via SUBCUTANEOUS

## 2021-05-30 MED ORDER — BUDESON-GLYCOPYRROL-FORMOTEROL 160-9-4.8 MCG/ACT IN AERO: 2.0000 | INHALATION_SPRAY | Freq: Two times a day (BID) | RESPIRATORY_TRACT | Status: DC

## 2021-05-30 MED ORDER — MORPHINE SULFATE (PF) 2 MG/ML IV SOLN: 0.5000 mg | INTRAVENOUS | Status: DC | PRN

## 2021-05-30 MED ORDER — POLYETHYLENE GLYCOL 3350 17 G PO PACK: 17.0000 g | PACK | Freq: Every day | ORAL | Status: DC | PRN

## 2021-05-30 MED ORDER — GABAPENTIN 400 MG PO CAPS
800.0000 mg | ORAL_CAPSULE | Freq: Three times a day (TID) | ORAL | Status: DC
  Administered 2021-05-30 – 2021-05-31 (×2): 800 mg via ORAL
  Filled 2021-05-30 (×2): qty 2

## 2021-05-30 MED ORDER — BUDESONIDE 0.5 MG/2ML IN SUSP
0.5000 mg | Freq: Two times a day (BID) | RESPIRATORY_TRACT | Status: DC
  Administered 2021-05-30 – 2021-05-31 (×2): 0.5 mg via RESPIRATORY_TRACT
  Filled 2021-05-30 (×2): qty 2

## 2021-05-30 MED ORDER — 0.9 % SODIUM CHLORIDE (POUR BTL) OPTIME
TOPICAL | Status: DC | PRN
  Administered 2021-05-30: 1000 mL

## 2021-05-30 MED ORDER — ALBUTEROL SULFATE (2.5 MG/3ML) 0.083% IN NEBU: 2.5000 mg | INHALATION_SOLUTION | RESPIRATORY_TRACT | Status: DC | PRN

## 2021-05-30 MED ORDER — METOPROLOL SUCCINATE ER 100 MG PO TB24
100.0000 mg | ORAL_TABLET | Freq: Every day | ORAL | Status: DC
Start: 2021-05-31 — End: 2021-05-31
  Administered 2021-05-31: 100 mg via ORAL
  Filled 2021-05-30 (×2): qty 1

## 2021-05-30 MED ORDER — FENTANYL CITRATE PF 50 MCG/ML IJ SOSY: 50.0000 ug | PREFILLED_SYRINGE | INTRAMUSCULAR | Status: DC

## 2021-05-30 MED ORDER — ATORVASTATIN CALCIUM 40 MG PO TABS
40.0000 mg | ORAL_TABLET | Freq: Every day | ORAL | Status: DC
Start: 2021-05-31 — End: 2021-05-31
  Administered 2021-05-31: 40 mg via ORAL
  Filled 2021-05-30: qty 1

## 2021-05-30 MED ORDER — FENTANYL CITRATE (PF) 100 MCG/2ML IJ SOLN
INTRAMUSCULAR | Status: DC | PRN
  Administered 2021-05-30 (×5): 50 ug via INTRAVENOUS

## 2021-05-30 MED ORDER — MIDAZOLAM HCL 2 MG/2ML IJ SOLN: 0.5000 mg | Freq: Once | INTRAMUSCULAR | Status: DC | PRN

## 2021-05-30 MED ORDER — ONDANSETRON HCL 4 MG/2ML IJ SOLN
INTRAMUSCULAR | Status: DC | PRN
  Administered 2021-05-30: 4 mg via INTRAVENOUS

## 2021-05-30 MED ORDER — MIDAZOLAM HCL 2 MG/2ML IJ SOLN
INTRAMUSCULAR | Status: AC
  Filled 2021-05-30: qty 2

## 2021-05-30 MED ORDER — FENTANYL CITRATE PF 50 MCG/ML IJ SOSY
50.0000 ug | PREFILLED_SYRINGE | Freq: Once | INTRAMUSCULAR | Status: DC
  Filled 2021-05-30: qty 2

## 2021-05-30 MED ORDER — FENTANYL CITRATE PF 50 MCG/ML IJ SOSY
25.0000 ug | PREFILLED_SYRINGE | INTRAMUSCULAR | Status: DC | PRN
  Administered 2021-05-30 (×2): 50 ug via INTRAVENOUS

## 2021-05-30 MED ORDER — LIDOCAINE 2% (20 MG/ML) 5 ML SYRINGE
INTRAMUSCULAR | Status: DC | PRN
  Administered 2021-05-30: 60 mg via INTRAVENOUS

## 2021-05-30 MED ORDER — CHLORHEXIDINE GLUCONATE 0.12 % MT SOLN
15.0000 mL | Freq: Once | OROMUCOSAL | Status: AC
  Administered 2021-05-30: 15 mL via OROMUCOSAL

## 2021-05-30 MED ORDER — ONDANSETRON HCL 4 MG/2ML IJ SOLN
INTRAMUSCULAR | Status: AC
  Filled 2021-05-30: qty 2

## 2021-05-30 MED ORDER — ONDANSETRON HCL 4 MG PO TABS: 4.0000 mg | ORAL_TABLET | Freq: Four times a day (QID) | ORAL | Status: DC | PRN

## 2021-05-30 MED ORDER — ARFORMOTEROL TARTRATE 15 MCG/2ML IN NEBU
15.0000 ug | INHALATION_SOLUTION | Freq: Two times a day (BID) | RESPIRATORY_TRACT | Status: DC
  Administered 2021-05-30 – 2021-05-31 (×2): 15 ug via RESPIRATORY_TRACT
  Filled 2021-05-30: qty 2

## 2021-05-30 MED ORDER — FENTANYL CITRATE PF 50 MCG/ML IJ SOSY
PREFILLED_SYRINGE | INTRAMUSCULAR | Status: AC
  Filled 2021-05-30: qty 2

## 2021-05-30 MED ORDER — ONDANSETRON HCL 4 MG/2ML IJ SOLN: 4.0000 mg | Freq: Four times a day (QID) | INTRAMUSCULAR | Status: DC | PRN

## 2021-05-30 MED ORDER — AMLODIPINE BESYLATE 5 MG PO TABS
5.0000 mg | ORAL_TABLET | Freq: Every day | ORAL | Status: DC
  Administered 2021-05-31: 5 mg via ORAL
  Filled 2021-05-30: qty 1

## 2021-05-30 MED ORDER — MIDAZOLAM HCL 2 MG/2ML IJ SOLN: 1.0000 mg | INTRAMUSCULAR | Status: DC

## 2021-05-30 MED ORDER — UMECLIDINIUM BROMIDE 62.5 MCG/ACT IN AEPB
1.0000 | INHALATION_SPRAY | Freq: Every day | RESPIRATORY_TRACT | Status: DC
  Administered 2021-05-31: 1 via RESPIRATORY_TRACT
  Filled 2021-05-30: qty 7

## 2021-05-30 MED ORDER — LOSARTAN POTASSIUM 25 MG PO TABS
25.0000 mg | ORAL_TABLET | Freq: Every day | ORAL | Status: DC
  Administered 2021-05-31: 25 mg via ORAL
  Filled 2021-05-30: qty 1

## 2021-05-30 MED ORDER — LIDOCAINE HCL (PF) 2 % IJ SOLN
INTRAMUSCULAR | Status: AC
  Filled 2021-05-30: qty 5

## 2021-05-30 MED ORDER — APIXABAN 5 MG PO TABS
5.0000 mg | ORAL_TABLET | Freq: Two times a day (BID) | ORAL | Status: DC
  Administered 2021-05-31: 5 mg via ORAL
  Filled 2021-05-30: qty 1

## 2021-05-30 MED ORDER — FENTANYL CITRATE (PF) 250 MCG/5ML IJ SOLN
INTRAMUSCULAR | Status: AC
  Filled 2021-05-30: qty 5

## 2021-05-30 MED ORDER — ACETAMINOPHEN 325 MG PO TABS: 325.0000 mg | ORAL_TABLET | Freq: Four times a day (QID) | ORAL | Status: DC | PRN

## 2021-05-30 MED ORDER — ROPIVACAINE HCL 7.5 MG/ML IJ SOLN
INTRAMUSCULAR | Status: DC | PRN
  Administered 2021-05-30: 20 mL via PERINEURAL

## 2021-05-30 MED ORDER — SODIUM CHLORIDE 0.9 % IR SOLN
Status: DC | PRN
  Administered 2021-05-30: 3000 mL

## 2021-05-30 MED ORDER — BISACODYL 10 MG RE SUPP: 10.0000 mg | Freq: Every day | RECTAL | Status: DC | PRN

## 2021-05-30 MED ORDER — METHOCARBAMOL 500 MG PO TABS
500.0000 mg | ORAL_TABLET | Freq: Four times a day (QID) | ORAL | Status: DC | PRN
  Administered 2021-05-30: 500 mg via ORAL
  Filled 2021-05-30: qty 1

## 2021-05-30 MED ORDER — KETAMINE HCL 10 MG/ML IJ SOLN
INTRAMUSCULAR | Status: AC
  Filled 2021-05-30: qty 1

## 2021-05-30 MED ORDER — LACTATED RINGERS IV SOLN: INTRAVENOUS | Status: DC

## 2021-05-30 MED ORDER — METOCLOPRAMIDE HCL 5 MG PO TABS: 5.0000 mg | ORAL_TABLET | Freq: Three times a day (TID) | ORAL | Status: DC | PRN

## 2021-05-30 MED ORDER — BENZONATATE 100 MG PO CAPS: 200.0000 mg | ORAL_CAPSULE | Freq: Three times a day (TID) | ORAL | Status: DC | PRN

## 2021-05-30 MED ORDER — POLYVINYL ALCOHOL 1.4 % OP SOLN
1.0000 [drp] | OPHTHALMIC | Status: DC | PRN
  Filled 2021-05-30: qty 15

## 2021-05-30 MED ORDER — MENTHOL 3 MG MT LOZG: 1.0000 | LOZENGE | OROMUCOSAL | Status: DC | PRN

## 2021-05-30 MED ORDER — HYDROCODONE-ACETAMINOPHEN 5-325 MG PO TABS: 1.0000 | ORAL_TABLET | ORAL | Status: DC | PRN

## 2021-05-30 MED ORDER — INSULIN GLARGINE-YFGN 100 UNIT/ML ~~LOC~~ SOLN
110.0000 [IU] | Freq: Two times a day (BID) | SUBCUTANEOUS | Status: DC
  Administered 2021-05-30 – 2021-05-31 (×2): 110 [IU] via SUBCUTANEOUS
  Filled 2021-05-30 (×3): qty 1.1

## 2021-05-30 MED ORDER — TORSEMIDE 20 MG PO TABS
80.0000 mg | ORAL_TABLET | Freq: Every day | ORAL | Status: DC
  Administered 2021-05-31: 80 mg via ORAL
  Filled 2021-05-30: qty 4

## 2021-05-30 MED ORDER — SENNA 8.6 MG PO TABS: 25.0000 mg | ORAL_TABLET | Freq: Every day | ORAL | Status: DC | PRN

## 2021-05-30 MED ORDER — ORAL CARE MOUTH RINSE: 15.0000 mL | Freq: Once | OROMUCOSAL | Status: AC

## 2021-05-30 MED ORDER — LEVOTHYROXINE SODIUM 112 MCG PO TABS
224.0000 ug | ORAL_TABLET | Freq: Every day | ORAL | Status: DC
  Administered 2021-05-31: 224 ug via ORAL
  Filled 2021-05-30: qty 2

## 2021-05-30 MED ORDER — ACETAMINOPHEN 500 MG PO TABS
1000.0000 mg | ORAL_TABLET | Freq: Once | ORAL | Status: AC
  Administered 2021-05-30: 500 mg via ORAL
  Filled 2021-05-30: qty 2

## 2021-05-30 MED ORDER — DOCUSATE SODIUM 100 MG PO CAPS
100.0000 mg | ORAL_CAPSULE | Freq: Two times a day (BID) | ORAL | Status: DC
  Administered 2021-05-30 – 2021-05-31 (×2): 100 mg via ORAL
  Filled 2021-05-30 (×2): qty 1

## 2021-05-30 MED ORDER — METHOCARBAMOL 1000 MG/10ML IJ SOLN
500.0000 mg | Freq: Four times a day (QID) | INTRAVENOUS | Status: DC | PRN
  Filled 2021-05-30: qty 5

## 2021-05-30 MED ORDER — CEFAZOLIN SODIUM-DEXTROSE 2-4 GM/100ML-% IV SOLN
2.0000 g | INTRAVENOUS | Status: AC
  Administered 2021-05-30: 2 g via INTRAVENOUS
  Filled 2021-05-30: qty 100

## 2021-05-30 MED ORDER — HYDROCODONE-ACETAMINOPHEN 7.5-325 MG PO TABS
1.0000 | ORAL_TABLET | ORAL | Status: DC | PRN
  Administered 2021-05-31 (×2): 1 via ORAL
  Filled 2021-05-30 (×2): qty 1

## 2021-05-30 MED ORDER — ALBUTEROL SULFATE HFA 108 (90 BASE) MCG/ACT IN AERS: 2.0000 | INHALATION_SPRAY | Freq: Four times a day (QID) | RESPIRATORY_TRACT | Status: DC | PRN

## 2021-05-30 MED ORDER — DIPHENHYDRAMINE HCL 12.5 MG/5ML PO ELIX: 12.5000 mg | ORAL_SOLUTION | ORAL | Status: DC | PRN

## 2021-05-30 MED ORDER — SODIUM CHLORIDE 0.9 % IV SOLN: INTRAVENOUS | Status: DC

## 2021-05-30 MED ORDER — PROMETHAZINE HCL 25 MG/ML IJ SOLN: 6.2500 mg | INTRAMUSCULAR | Status: DC | PRN

## 2021-05-30 MED ORDER — DEXAMETHASONE SODIUM PHOSPHATE 10 MG/ML IJ SOLN
8.0000 mg | Freq: Once | INTRAMUSCULAR | Status: AC
  Administered 2021-05-30: 8 mg via INTRAVENOUS

## 2021-05-30 MED ORDER — PHENOL 1.4 % MT LIQD: 1.0000 | OROMUCOSAL | Status: DC | PRN

## 2021-05-30 MED ORDER — PROPOFOL 10 MG/ML IV BOLUS
INTRAVENOUS | Status: AC
  Filled 2021-05-30: qty 20

## 2021-05-30 MED ORDER — MEPERIDINE HCL 50 MG/ML IJ SOLN: 6.2500 mg | INTRAMUSCULAR | Status: DC | PRN

## 2021-05-30 MED ORDER — FLUTICASONE PROPIONATE 50 MCG/ACT NA SUSP
2.0000 | Freq: Every day | NASAL | Status: DC | PRN
  Filled 2021-05-30: qty 16

## 2021-05-30 SURGICAL SUPPLY — 38 items
BAG COUNTER SPONGE SURGICOUNT (BAG) ×2 IMPLANT
BAG ZIPLOCK 12X15 (MISCELLANEOUS) ×3 IMPLANT
BANDAGE ESMARK 6X9 LF (GAUZE/BANDAGES/DRESSINGS) ×1 IMPLANT
BLADE SAW SGTL 11.0X1.19X90.0M (BLADE) IMPLANT
BNDG ELASTIC 6X5.8 VLCR STR LF (GAUZE/BANDAGES/DRESSINGS) ×2 IMPLANT
BNDG ESMARK 6X9 LF (GAUZE/BANDAGES/DRESSINGS)
BNDG GAUZE ELAST 4 BULKY (GAUZE/BANDAGES/DRESSINGS) ×3 IMPLANT
COVER SURGICAL LIGHT HANDLE (MISCELLANEOUS) ×3 IMPLANT
CUFF TOURN SGL QUICK 18X4 (TOURNIQUET CUFF) ×1 IMPLANT
CUFF TOURN SGL QUICK 24 (TOURNIQUET CUFF)
CUFF TOURN SGL QUICK 34 (TOURNIQUET CUFF) ×3
CUFF TRNQT CYL 24X4X16.5-23 (TOURNIQUET CUFF) ×1 IMPLANT
CUFF TRNQT CYL 34X4.125X (TOURNIQUET CUFF) ×2 IMPLANT
DRAIN PENROSE 0.5X18 (DRAIN) ×1 IMPLANT
DRSG PAD ABDOMINAL 8X10 ST (GAUZE/BANDAGES/DRESSINGS) ×3 IMPLANT
DURAPREP 26ML APPLICATOR (WOUND CARE) ×3 IMPLANT
ELECT REM PT RETURN 15FT ADLT (MISCELLANEOUS) ×3 IMPLANT
GAUZE SPONGE 4X4 12PLY STRL (GAUZE/BANDAGES/DRESSINGS) ×3 IMPLANT
GAUZE XEROFORM 1X8 LF (GAUZE/BANDAGES/DRESSINGS) ×2 IMPLANT
GLOVE SURG ENC TEXT LTX SZ7 (GLOVE) IMPLANT
GLOVE SURG ORTHO LTX SZ8 (GLOVE) ×3 IMPLANT
GLOVE SURG UNDER POLY LF SZ7.5 (GLOVE) ×7 IMPLANT
GOWN STRL REUS W/TWL LRG LVL3 (GOWN DISPOSABLE) ×5 IMPLANT
HANDPIECE INTERPULSE COAX TIP (DISPOSABLE) ×3
KIT BASIN OR (CUSTOM PROCEDURE TRAY) ×1 IMPLANT
KIT TURNOVER KIT A (KITS) IMPLANT
MANIFOLD NEPTUNE II (INSTRUMENTS) ×3 IMPLANT
PACK ORTHO EXTREMITY (CUSTOM PROCEDURE TRAY) ×3 IMPLANT
PAD CAST 4YDX4 CTTN HI CHSV (CAST SUPPLIES) ×2 IMPLANT
PADDING CAST COTTON 4X4 STRL (CAST SUPPLIES) ×3
PADDING CAST COTTON 6X4 STRL (CAST SUPPLIES) ×2 IMPLANT
PROTECTOR NERVE ULNAR (MISCELLANEOUS) ×3 IMPLANT
SET HNDPC FAN SPRY TIP SCT (DISPOSABLE) ×2 IMPLANT
SOL SCRUB PVP POV-IOD 4OZ 7.5% (MISCELLANEOUS) ×3
SOLUTION SCRB POV-IOD 4OZ 7.5% (MISCELLANEOUS) ×2 IMPLANT
SUT ETHILON 2 0 PS N (SUTURE) ×2 IMPLANT
SYR CONTROL 10ML LL (SYRINGE) ×3 IMPLANT
TOWEL OR 17X26 10 PK STRL BLUE (TOWEL DISPOSABLE) ×6 IMPLANT

## 2021-05-30 NOTE — Anesthesia Preprocedure Evaluation (Addendum)
Anesthesia Evaluation  Patient identified by MRN, date of birth, ID band Patient awake    Reviewed: Allergy & Precautions, NPO status , Patient's Chart, lab work & pertinent test results  History of Anesthesia Complications (+) PONV  Airway Mallampati: II  TM Distance: >3 FB Neck ROM: Full    Dental  (+) Edentulous Upper, Partial Lower, Dental Advisory Given   Pulmonary sleep apnea and Continuous Positive Airway Pressure Ventilation , COPD,  COPD inhaler, former smoker,  05/30/2021 SARS coronavirus NEG   breath sounds clear to auscultation       Cardiovascular hypertension, Pt. on medications and Pt. on home beta blockers + CAD and + Peripheral Vascular Disease  + dysrhythmias Atrial Fibrillation + pacemaker  Rhythm:Regular Rate:Normal  10/03/2020 ECHO: EF 60-65%, normal LVF, severe LVH, Grade 2 DD, RVF normal, no significant valvular abnormalities   Neuro/Psych  Headaches, Anxiety    GI/Hepatic Neg liver ROS, GERD  Controlled,  Endo/Other  diabetes (glu 113)Hypothyroidism Morbid obesity  Renal/GU Renal InsufficiencyRenal disease     Musculoskeletal  (+) Arthritis ,   Abdominal (+) + obese,   Peds  Hematology eliquis   Anesthesia Other Findings   Reproductive/Obstetrics                            Anesthesia Physical Anesthesia Plan  ASA: 3  Anesthesia Plan: General   Post-op Pain Management: Tylenol PO (pre-op) and Regional block   Induction:   PONV Risk Score and Plan: 4 or greater and Ondansetron, Dexamethasone and Treatment may vary due to age or medical condition  Airway Management Planned: LMA  Additional Equipment: None  Intra-op Plan:   Post-operative Plan:   Informed Consent: I have reviewed the patients History and Physical, chart, labs and discussed the procedure including the risks, benefits and alternatives for the proposed anesthesia with the patient or authorized  representative who has indicated his/her understanding and acceptance.     Dental advisory given  Plan Discussed with: CRNA and Surgeon  Anesthesia Plan Comments:         Anesthesia Quick Evaluation

## 2021-05-30 NOTE — Discharge Instructions (Signed)
INSTRUCTIONS AFTER SURGERY  Remove items at home which could result in a fall. This includes throw rugs or furniture in walking pathways ICE to the affected joint every three hours while awake for 30 minutes at a time, for at least the first 3-5 days, and then as needed for pain and swelling.  Continue to use ice for pain and swelling. You may notice swelling that will progress down to the foot and ankle.  This is normal after surgery.  Elevate your leg when you are not up walking on it.   Continue to use the breathing machine you got in the hospital (incentive spirometer) which will help keep your temperature down.  It is common for your temperature to cycle up and down following surgery, especially at night when you are not up moving around and exerting yourself.  The breathing machine keeps your lungs expanded and your temperature down.   DIET:  As you were doing prior to hospitalization, we recommend a well-balanced diet.  DRESSING / WOUND CARE / SHOWERING  You may change your dressing 3-5 days after surgery.  Then change the dressing every day with sterile gauze.  Please use good hand washing techniques before changing the dressing.  Do not use any lotions or creams on the incision until instructed by your surgeon.  ACTIVITY  Increase activity slowly as tolerated, but follow the weight bearing instructions below.   No driving for 6 weeks or until further direction given by your physician.  You cannot drive while taking narcotics.  No lifting or carrying greater than 10 lbs. until further directed by your surgeon. Avoid periods of inactivity such as sitting longer than an hour when not asleep. This helps prevent blood clots.  You may return to work once you are authorized by your doctor.     WEIGHT BEARING   Weight bearing as tolerated with assist device (walker, cane, etc) as directed, use it as long as suggested by your surgeon or therapist, typically at least 4-6  weeks.  CONSTIPATION  Constipation is defined medically as fewer than three stools per week and severe constipation as less than one stool per week.  Even if you have a regular bowel pattern at home, your normal regimen is likely to be disrupted due to multiple reasons following surgery.  Combination of anesthesia, postoperative narcotics, change in appetite and fluid intake all can affect your bowels.   YOU MUST use at least one of the following options; they are listed in order of increasing strength to get the job done.  They are all available over the counter, and you may need to use some, POSSIBLY even all of these options:    Drink plenty of fluids (prune juice may be helpful) and high fiber foods Colace 100 mg by mouth twice a day  Senokot for constipation as directed and as needed Dulcolax (bisacodyl), take with full glass of water  Miralax (polyethylene glycol) once or twice a day as needed.  If you have tried all these things and are unable to have a bowel movement in the first 3-4 days after surgery call either your surgeon or your primary doctor.    If you experience loose stools or diarrhea, hold the medications until you stool forms back up.  If your symptoms do not get better within 1 week or if they get worse, check with your doctor.  If you experience "the worst abdominal pain ever" or develop nausea or vomiting, please contact the office immediately for  further recommendations for treatment.   ITCHING:  If you experience itching with your medications, try taking only a single pain pill, or even half a pain pill at a time.  You can also use Benadryl over the counter for itching or also to help with sleep.   TED HOSE STOCKINGS:  Use stockings on both legs until for at least 2 weeks or as directed by physician office. They may be removed at night for sleeping.  MEDICATIONS:  See your medication summary on the After Visit Summary that nursing will review with you.  You may have  some home medications which will be placed on hold until you complete the course of blood thinner medication.  It is important for you to complete the blood thinner medication as prescribed.  PRECAUTIONS:  If you experience chest pain or shortness of breath - call 911 immediately for transfer to the hospital emergency department.   If you develop a fever greater that 101 F, purulent drainage from wound, increased redness or drainage from wound, foul odor from the wound/dressing, or calf pain - CONTACT YOUR SURGEON.                                                   FOLLOW-UP APPOINTMENTS:  If you do not already have a post-op appointment, please call the office for an appointment to be seen by your surgeon.  Guidelines for how soon to be seen are listed in your After Visit Summary, but are typically between 1-4 weeks after surgery.  POST-OPERATIVE OPIOID TAPER INSTRUCTIONS: It is important to wean off of your opioid medication as soon as possible. If you do not need pain medication after your surgery it is ok to stop day one. Opioids include: Codeine, Hydrocodone(Norco, Vicodin), Oxycodone(Percocet, oxycontin) and hydromorphone amongst others.  Long term and even short term use of opiods can cause: Increased pain response Dependence Constipation Depression Respiratory depression And more.  Withdrawal symptoms can include Flu like symptoms Nausea, vomiting And more Techniques to manage these symptoms Hydrate well Eat regular healthy meals Stay active Use relaxation techniques(deep breathing, meditating, yoga) Do Not substitute Alcohol to help with tapering If you have been on opioids for less than two weeks and do not have pain than it is ok to stop all together.  Plan to wean off of opioids This plan should start within one week post op of your joint replacement. Maintain the same interval or time between taking each dose and first decrease the dose.  Cut the total daily intake of  opioids by one tablet each day Next start to increase the time between doses. The last dose that should be eliminated is the evening dose.   MAKE SURE YOU:  Understand these instructions.  Get help right away if you are not doing well or get worse.    Thank you for letting us be a part of your medical care team.  It is a privilege we respect greatly.  We hope these instructions will help you stay on track for a fast and full recovery!

## 2021-05-30 NOTE — Anesthesia Postprocedure Evaluation (Signed)
Anesthesia Post Note  Patient: LEVY WELLMAN  Procedure(s) Performed: IRRIGATION AND DEBRIDEMENT OF PREPATELLA BURSA (Right: Knee)     Patient location during evaluation: PACU Anesthesia Type: General Level of consciousness: awake and alert, patient cooperative and oriented Pain management: pain level controlled Vital Signs Assessment: post-procedure vital signs reviewed and stable Respiratory status: spontaneous breathing, nonlabored ventilation and respiratory function stable Cardiovascular status: blood pressure returned to baseline and stable Postop Assessment: no apparent nausea or vomiting and adequate PO intake Anesthetic complications: no   No notable events documented.  Last Vitals:  Vitals:   05/30/21 1815 05/30/21 1830  BP: (!) 161/64   Pulse: (!) 59 (!) 59  Resp: 12 (!) 23  Temp: (!) 35.9 C   SpO2: 100% 100%    Last Pain:  Vitals:   05/30/21 1815  TempSrc:   PainSc: Asleep                 Xabi Wittler,E. Gabrielly Mccrystal

## 2021-05-30 NOTE — Transfer of Care (Signed)
Immediate Anesthesia Transfer of Care Note  Patient: Kayla [name]  Procedure(s) Performed: IRRIGATION AND DEBRIDEMENT OF PREPATELLA BURSA (Right: Knee)  Patient Location: PACU  Anesthesia Type:General and Regional  Level of Consciousness: awake, alert  and oriented  Airway & Oxygen Therapy: Patient Spontanous Breathing and Patient connected to face mask oxygen  Post-op Assessment: Report given to RN and Post -op Vital signs reviewed and stable  Post vital signs: Reviewed and stable  Last Vitals:  Vitals Value Taken Time  BP 139/54 05/30/21 1731  Temp    Pulse 60 05/30/21 1733  Resp 15 05/30/21 1733  SpO2 100 % 05/30/21 1733  Vitals shown include unvalidated device data.  Last Pain:  Vitals:   05/30/21 1025  TempSrc:   PainSc: 6          Complications: No notable events documented.

## 2021-05-30 NOTE — H&P (Signed)
Kayla [name] is an 81 y.o. female.    Chief Complaint: right knee pre-patellar septic bursitis   HPI: Pt is a 81 y.o. female complaining of right knee pain, swelling and erythema. Aspiration in the office revealed culture of staph epi sensitive to Clindamycin  PCP:  Hamrick, Lorin Mercy, MD  D/C Plans: To be determined following appropriate treatment plan  PMH: Past Medical History:  Diagnosis Date   Allergy    Anxiety    Arthritis    CAD (coronary artery disease)    Mild per remote cath in 2006, /    Nuclear, January, 2013, no significant abnormality,   Carotid artery disease ([place])    Doppler, January, 2013, 0 67% RIC A., 34-19% LICA, stable   Cataract    CHF (congestive heart failure) ([place])    Chronic kidney disease    Complication of anesthesia    wakes up "mean"   COPD 02/16/2007   Intolerant of Spiriva, tudorza Intolerant of Trelegy    COPD (chronic obstructive pulmonary disease) ([place])    Coronary atherosclerosis 11/24/2008   Catheterization 2006, nonobstructive coronary disease   //   nuclear 2013, low risk     Diabetes mellitus, type 2 ([place])    Diverticula of colon    DIVERTICULOSIS OF COLON 03/23/2007   Qualifier: Diagnosis of  By: Halford Chessman MD, Vineet     Dizziness    occasional   Dizzy spells 05/04/2011   DM (diabetes mellitus) ([place]) 03/23/2007   Qualifier: Diagnosis of  By: Halford Chessman MD, Vineet     Dyspnea    On exertion   Ejection fraction    Essential hypertension 02/16/2007   Qualifier: Diagnosis of  By: Rosana Hoes CMA, Tammy     G E R D 03/23/2007   Qualifier: Diagnosis of  By: Halford Chessman MD, Vineet     Gall bladder disease 04/28/2016   Gastritis and gastroduodenitis    GERD (gastroesophageal reflux disease)    Goiter    hx of   Headache(784.0)    migraine   Hyperlipidemia    Hypertension    Hypothyroidism    HYPOTHYROIDISM, POSTSURGICAL 06/04/2008   Qualifier: Diagnosis of  By: Loanne Drilling MD, Sean A    Left ventricular hypertrophy    Leg edema    occasional  swelling   Leg swelling 12/09/2018   Myofascial pain syndrome, cervical 01/06/2018   Obesity    OBESITY 11/24/2008   Qualifier: Diagnosis of  By: Sidney Ace     OBSTRUCTIVE SLEEP APNEA 02/16/2007   Auto CPAP 09/03/13 to 10/02/13 >> used on 30 of 30 nights with average 6 hrs and 3 min.  Average AHI is 3.1 with median CPAP 5 cm H2O and 95 th percentile CPAP 6 cm H2O.     Occipital neuralgia of right side 01/06/2018   OSA (obstructive sleep apnea)    cpap setting of 12   Pain in joint of right hip 01/21/2018   Palpitations 01/21/2011   48 hour Holter, 2015, scattered PACs and PVCs.    Paroxysmal atrial fibrillation (HCC)    Pneumonia    PONV (postoperative nausea and vomiting)    severe after every surgery   Pulmonary hypertension, secondary    RHINITIS 07/21/2010        RUQ abdominal pain    S/P left TKA 07/27/2011   Sinus node dysfunction (HCC) 11/08/2015   Sleep apnea    Spinal stenosis of cervical region 11/07/2014   Urinary frequency 12/09/2018   UTI (urinary tract  infection) 08/15/2019   per pt report   VENTRICULAR HYPERTROPHY, LEFT 11/24/2008   Qualifier: Diagnosis of  By: Sidney Ace      PSH: Past Surgical History:  Procedure Laterality Date   ABDOMINAL HYSTERECTOMY     with appendectomy and colon polypectomy   AMPUTATION TOE Left 12/15/2019   Procedure: AMPUTATION  LEFT GREAT TOE;  Surgeon: Evelina Bucy, DPM;  Location: [facility];  Service: Podiatry;  Laterality: Left;   ANTERIOR CERVICAL DECOMP/DISCECTOMY FUSION N/A 11/07/2014   Procedure: Anterior Cervical diskectomy and fusion Cervical four-five, Cervical five-six, Cervical six-seven;  Surgeon: Kary Kos, MD;  Location: [place] [facility];  Service: Neurosurgery;  Laterality: N/A;   APPENDECTOMY     ARTERY BIOPSY Right 02/16/2017   Procedure: BIOPSY TEMPORAL ARTERY;  Surgeon: Katha Cabal, MD;  Location: [facility];  Service: Vascular;  Laterality: Right;   ARTERY BIOPSY Left 04/16/2020   Procedure: BIOPSY  TEMPORAL ARTERY;  Surgeon: Katha Cabal, MD;  Location: [facility];  Service: Vascular;  Laterality: Left;  LEFT TEMPLE   BACK SURGERY     CARDIAC CATHETERIZATION     CATARACT EXTRACTION W/ INTRAOCULAR LENS  IMPLANT, BILATERAL Bilateral    CHOLECYSTECTOMY N/A 04/29/2016   Procedure: LAPAROSCOPIC CHOLECYSTECTOMY WITH INTRAOPERATIVE CHOLANGIOGRAM;  Surgeon: Alphonsa Overall, MD;  Location: [facility];  Service: General;  Laterality: N/A;   EP IMPLANTABLE DEVICE N/A 11/08/2015   MRI COMPATABLE -- Procedure: Pacemaker Implant;  Surgeon: Deboraha Sprang, MD;  Location: [place] [facility];  Service: Cardiovascular;  Laterality: N/A;   INSERT / REPLACE / REMOVE PACEMAKER  2017   Dr. Jens Som [facility])   JOINT REPLACEMENT     LUMBAR [place] SURGERY  1990's   x 2   THYROIDECTOMY  2011   TOTAL KNEE ARTHROPLASTY  07/27/2011   Procedure: TOTAL KNEE ARTHROPLASTY;  Surgeon: Mauri Pole, MD;  Location: [facility];  Service: Orthopedics;  Laterality: Left;   UPPER ESOPHAGEAL ENDOSCOPIC ULTRASOUND (EUS) N/A 05/14/2016   Procedure: UPPER ESOPHAGEAL ENDOSCOPIC ULTRASOUND (EUS);  Surgeon: Milus Banister, MD;  Location: [facility];  Service: Endoscopy;  Laterality: N/A;  radial only-will be done mod if needed    Social History:  reports that she quit smoking about 24 years ago. Her smoking use included cigarettes. She started smoking about 57 years ago. She has a 66.00 pack-year smoking history. She has never used smokeless tobacco. She reports that she does not drink alcohol and does not use drugs.  Allergies:  Allergies  Allergen Reactions   Dulaglutide Nausea Only, Other (See Comments) and Cough    Made the patient sick to her stomach (only) several days after using it Trulicity   Oxycodone Itching    Pt still takes this med even thought it causes itching   Trelegy Ellipta [Fluticasone-Umeclidin-Vilant] Other (See Comments)    unknown    Medications: Medications Prior to Admission  Medication Sig Dispense  Refill   acetaminophen (TYLENOL) 500 MG tablet Take 1,000 mg by mouth every 6 (six) hours as needed for moderate pain (for headaches).      albuterol (VENTOLIN HFA) 108 (90 Base) MCG/ACT inhaler Inhale 2 puffs into the lungs every 6 (six) hours as needed for wheezing or shortness of breath. 3 each 0   allopurinol (ZYLOPRIM) 100 MG tablet Take 100 mg by mouth 2 (two) times daily.     amiodarone (PACERONE) 200 MG tablet Take 1 tablet (200 mg total) by mouth daily. 90 tablet 3   amLODipine (  NORVASC) 5 MG tablet Take 1 tablet (5 mg total) by mouth daily. 90 tablet 3   ascorbic acid (VITAMIN C) 500 MG tablet Take 500 mg by mouth every morning.     atorvastatin (LIPITOR) 40 MG tablet TAKE 1 TABLET EVERY DAY (Patient taking differently: Take 40 mg by mouth daily.) 90 tablet 3   BREZTRI AEROSPHERE 160-9-4.8 MCG/ACT AERO INHALE 2 PUFFS INTO THE LUNGS IN THE MORNING AND AT BEDTIME. 32.1 g 3   Carboxymethylcellulose Sodium (ARTIFICIAL TEARS OP) Place 1 drop into both eyes daily as needed ([place] damage).     cephALEXin (KEFLEX) 500 MG capsule Take 500 mg by mouth 4 (four) times daily.     ELIQUIS 5 MG TABS tablet TAKE 1 TABLET TWICE DAILY (Patient taking differently: Take 5 mg by mouth 2 (two) times daily.) 180 tablet 1   ferrous sulfate 325 (65 FE) MG tablet Take 1 tablet (325 mg total) by mouth daily with breakfast. 90 tablet 3   fluticasone (FLONASE) 50 MCG/ACT nasal spray USE 2 SPRAYS NASALLY DAILY (Patient taking differently: Place 2 sprays into both nostrils daily as needed for allergies or rhinitis.) 48 g 1   gabapentin (NEURONTIN) 800 MG tablet Take 800 mg by mouth in the morning, at noon, in the evening, and at bedtime.     HYDROcodone-acetaminophen (NORCO/VICODIN) 5-325 MG tablet Take 1 tablet by mouth in the morning, at noon, in the evening, and at bedtime.     insulin aspart (NOVOLOG) 100 UNIT/ML FlexPen Inject 5-15 Units into the skin 3 (three) times daily with meals. Per sliding scale (and an  additional 1 unit for every 50 points for a BGL >130) Sliding scale     insulin glargine (LANTUS) 100 UNIT/ML injection Inject 110 Units into the skin in the morning and at bedtime. Unless if she has a sugar low then will take less     levothyroxine (SYNTHROID) 112 MCG tablet Take 224 mcg by mouth daily.     losartan (COZAAR) 50 MG tablet Take 25 mg by mouth daily.     metoprolol succinate (TOPROL-XL) 100 MG 24 hr tablet TAKE 1 TABLET EVERY DAY WITH OR IMMEDIATELY AFTER A MEAL (Patient taking differently: Take 100 mg by mouth daily.) 90 tablet 3   naloxone (NARCAN) nasal spray 4 mg/0.1 mL Place 4 mg into the nose once as needed for opioid reversal.     nitroGLYCERIN (NITROSTAT) 0.4 MG SL tablet Place 1 tablet (0.4 mg total) under the tongue every 5 (five) minutes as needed for chest pain. 25 tablet 4   PRESCRIPTION MEDICATION 1 Dose by Other route at bedtime. CPAP: At bedtime and "as needed for shortness of breath"     Sennosides (LAXATIVE) 25 MG TABS Take 25 mg by mouth daily as needed (Constipation). [employer] brand     torsemide (DEMADEX) 20 MG tablet Take 80 mg by mouth daily.     vitamin B-12 (CYANOCOBALAMIN) 500 MCG tablet Take 500 mcg by mouth daily.     Vitamin D3 (VITAMIN D) 25 MCG tablet Take 1,000 Units by mouth every morning.     albuterol (PROVENTIL) (2.5 MG/3ML) 0.083% nebulizer solution Take 3 mLs (2.5 mg total) by nebulization every 2 (two) hours as needed for wheezing or shortness of breath. (Patient taking differently: Take 2.5 mg by nebulization in the morning, at noon, and at bedtime.) 75 mL 5   Alcohol Swabs (B-D SINGLE USE SWABS REGULAR) PADS      benzonatate (TESSALON) 200 MG capsule  Take 200 mg by mouth 3 (three) times daily as needed for cough.     Blood Glucose Calibration (TRUE METRIX LEVEL 1) Low SOLN      Dextromethorphan-guaiFENesin (TUSSIN DM MAX SUGAR-FREE PO) Take 5 mLs by mouth every 4 (four) hours as needed (for coughing).     polyethylene glycol (MIRALAX /  GLYCOLAX) 17 g packet Take 17 g by mouth daily as needed for mild constipation. (Patient not taking: Reported on 05/29/2021) 14 each 0   TRUE METRIX BLOOD GLUCOSE TEST test strip      TRUEplus Lancets 28G MISC       Results for orders placed or performed during the hospital encounter of 05/30/21 (from the past 48 hour(s))  SARS Coronavirus 2 by RT PCR (hospital order, performed in [facility] hospital lab) Nasopharyngeal Nasopharyngeal Swab     Status: None   Collection Time: 05/30/21 10:03 AM   Specimen: Nasopharyngeal Swab  Result Value Ref Range   SARS Coronavirus 2 NEGATIVE NEGATIVE    Comment: (NOTE) SARS-CoV-2 target nucleic acids are NOT DETECTED.  The SARS-CoV-2 RNA is generally detectable in upper and lower respiratory specimens during the acute phase of infection. The lowest concentration of SARS-CoV-2 viral copies this assay can detect is 250 copies / mL. A negative result does not preclude SARS-CoV-2 infection and should not be used as the sole basis for treatment or other patient management decisions.  A negative result may occur with improper specimen collection / handling, submission of specimen other than nasopharyngeal swab, presence of viral mutation(s) within the areas targeted by this assay, and inadequate number of viral copies (<250 copies / mL). A negative result must be combined with clinical observations, patient history, and epidemiological information.  Fact Sheet for Patients:   StrictlyIdeas.no  Fact Sheet for Healthcare Providers: BankingDealers.co.za  This test is not yet approved or  cleared by the [place] FDA and has been authorized for detection and/or diagnosis of SARS-CoV-2 by FDA under an Emergency Use Authorization (EUA).  This EUA will remain in effect (meaning this test can be used) for the duration of the COVID-19 declaration under Section 564(b)(1) of the Act, 21 U.S.C. section  360bbb-3(b)(1), unless the authorization is terminated or revoked sooner.  Performed at [facility], [place] [address] [place], [place] [address]   Glucose, capillary     Status: Abnormal   Collection Time: 05/30/21 10:13 AM  Result Value Ref Range   Glucose-Capillary 113 (H) 70 - 99 mg/dL    Comment: Glucose reference range applies only to samples taken after fasting for at least 8 hours.  Hemoglobin A1c per protocol     Status: Abnormal   Collection Time: 05/30/21 10:23 AM  Result Value Ref Range   Hgb A1c MFr Bld 8.4 (H) 4.8 - 5.6 %    Comment: (NOTE) Pre diabetes:          5.7%-6.4%  Diabetes:              >6.4%  Glycemic control for   <7.0% adults with diabetes    Mean Plasma Glucose 194.38 mg/dL    Comment: Performed at [facility] [address] [place], [place] [address]  Basic metabolic panel per protocol     Status: Abnormal   Collection Time: 05/30/21 10:23 AM  Result Value Ref Range   Sodium 138 135 - 145 mmol/L   Potassium 4.2 3.5 - 5.1 mmol/L   Chloride 105 98 - 111 mmol/L  CO2 24 22 - 32 mmol/L   Glucose, Bld 109 (H) 70 - 99 mg/dL    Comment: Glucose reference range applies only to samples taken after fasting for at least 8 hours.   BUN 52 (H) 8 - 23 mg/dL   Creatinine, Ser 1.49 (H) 0.44 - 1.00 mg/dL   Calcium 9.2 8.9 - 10.3 mg/dL   GFR, Estimated 35 (L) >60 mL/min    Comment: (NOTE) Calculated using the CKD-EPI Creatinine Equation (2021)    Anion gap 9 5 - 15    Comment: Performed at [facility], [place] [address] [place], [place] [address]  CBC per protocol     Status: Abnormal   Collection Time: 05/30/21 10:23 AM  Result Value Ref Range   WBC 15.9 (H) 4.0 - 10.5 K/uL   RBC 3.78 (L) 3.87 - 5.11 MIL/uL   Hemoglobin 11.1 (L) 12.0 - 15.0 g/dL   HCT 35.4 (L) 36.0 - 46.0 %   MCV 93.7 80.0 - 100.0 fL   MCH 29.4 26.0 - 34.0 pg   MCHC 31.4 30.0 - 36.0 g/dL   RDW 15.9 (H) 11.5 - 15.5 %   Platelets 517 (H) 150  - 400 K/uL   nRBC 0.0 0.0 - 0.2 %    Comment: Performed at [facility], [place] [address], [place], [place] [address]  Type and screen Order type and screen if day of surgery is less than 15 days from draw of preadmission visit or order morning of surgery if day of surgery is greater than 6 days from preadmission visit.     Status: None   Collection Time: 05/30/21 10:23 AM  Result Value Ref Range   ABO/RH(D) B POS    Antibody Screen NEG    Sample Expiration      06/02/2021,2359 Performed at [facility], [place] [address] [place], [place] [address]    No results found.  ROS: Review of Systems - Negative except HPI  Physican Exam: Blood pressure (!) 168/54, pulse 60, temperature 97.6 F (36.4 C), temperature source Oral, resp. rate 15, height 5\' 5"  (1.651 m), weight 104.8 kg, SpO2 97 %. Right knee: erythema and swelling anterior right knee Mildly tender No pain with ROM of knee  No palpable effusion right knee 2+ edema with reactive erythema    Assessment/Plan Assessment: right knee prepatellar bursitis   Plan: Patient will undergo an I&D of her right knee. Risks benefits and expectation were discussed with the patient. Patient understand risks, benefits and expectation and wishes to proceed.   Pietro Cassis Alvan Dame, MD  05/30/2021, 4:24 PM

## 2021-05-30 NOTE — Anesthesia Procedure Notes (Signed)
Procedure Name: LMA Insertion Date/Time: 05/30/2021 4:45 PM Performed by: Xavia Kniskern D, CRNA Pre-anesthesia Checklist: Patient identified, Emergency Drugs available, Suction available and Patient being monitored Patient Re-evaluated:Patient Re-evaluated prior to induction Oxygen Delivery Method: Circle system utilized Preoxygenation: Pre-oxygenation with 100% oxygen Induction Type: IV induction Ventilation: Mask ventilation without difficulty LMA: LMA inserted LMA Size: 4.0 Tube type: Oral Number of attempts: 1 Placement Confirmation: positive ETCO2 and breath sounds checked- equal and bilateral Tube secured with: Tape Dental Injury: Teeth and Oropharynx as per pre-operative assessment

## 2021-05-30 NOTE — Progress Notes (Signed)
CULTURE, AEROBIC BACTERIA  (05/22/21)      Micro Number:      [id number]   Test Status:       Final   Specimen Source:   Knee, right   Specimen Quality:  Adequate   Result:            Light growth of Staphylococcus epidermidis                      Negative for inducible clindamycin resistance.                            S.epidermidis                           ----------------                           INT   MIC    CIPROFLOXACIN          R     4    CLINDAMYCIN            S     <=0.25    ERYTHROMYCIN           R     >=8    GENTAMICIN             S     <=0.5    LEVOFLOXACIN           I     4    OXACILLIN              R     NR **1    TETRACYCLINE           S     2    TRIMETHOPRIM/SULFA     S     <=10    VANCOMYCIN             S     1  S=Susceptible  I=Intermediate  R=Resistant  * = Not Tested NR = Not Reported  **NN = See Therapy Comments   THERAPY COMMENTS      Note 1:     Oxacillin-resistant staphylococci are resistant to     all currently available beta-lactam antimicrobial     agents with the possible exception of ceftaroline.

## 2021-05-30 NOTE — Plan of Care (Signed)
°  Problem: Education: Goal: Knowledge of General Education information will improve Description: Including pain rating scale, medication(s)/side effects and non-pharmacologic comfort measures Outcome: Progressing   Problem: Activity: Goal: Risk for activity intolerance will decrease Outcome: Progressing   Problem: Nutrition: Goal: Adequate nutrition will be maintained Outcome: Progressing   Problem: Elimination: Goal: Will not experience complications related to bowel motility Outcome: Progressing   Problem: Pain Managment: Goal: General experience of comfort will improve Outcome: Progressing   Problem: Safety: Goal: Ability to remain free from injury will improve Outcome: Progressing   Problem: Education: Goal: Required Educational Video(s) Outcome: Progressing   Problem: Skin Integrity: Goal: Demonstration of wound healing without infection will improve Outcome: Progressing

## 2021-05-30 NOTE — Anesthesia Procedure Notes (Signed)
Anesthesia Regional Block: Adductor canal block   Pre-Anesthetic Checklist: , timeout performed,  Correct Patient, Correct Site, Correct Laterality,  Correct Procedure, Correct Position, site marked,  Risks and benefits discussed,  Surgical consent,  Pre-op evaluation,  At surgeon's request and post-op pain management  Laterality: Right and Lower  Prep: chloraprep       Needles:  Injection technique: Single-shot  Needle Type: Echogenic Needle     Needle Length: 9cm  Needle Gauge: 21     Additional Needles:   Procedures:,,,, ultrasound used (permanent image in chart),,    Narrative:  Start time: 05/30/2021 3:46 PM End time: 05/30/2021 3:52 PM Injection made incrementally with aspirations every 5 mL.  Performed by: Personally  Anesthesiologist: Annye Asa, MD  Additional Notes: Pt identified in Holding room.  Monitors applied. Working IV access confirmed. Sterile prep R thigh.  #21ga ECHOgenic Arrow block needle into adductor canal with US guidance.  20cc 0.75% Ropivacaine injected incrementally after negative test dose.  Patient asymptomatic, VSS, no heme aspirated, tolerated well.   Jenita Seashore, MD

## 2021-05-31 ENCOUNTER — Encounter (HOSPITAL_COMMUNITY): Payer: Self-pay | Admitting: Orthopedic Surgery

## 2021-05-31 DIAGNOSIS — E1122 Type 2 diabetes mellitus with diabetic chronic kidney disease: Secondary | ICD-10-CM | POA: Diagnosis not present

## 2021-05-31 DIAGNOSIS — M71161 Other infective bursitis, right knee: Secondary | ICD-10-CM | POA: Diagnosis not present

## 2021-05-31 DIAGNOSIS — I509 Heart failure, unspecified: Secondary | ICD-10-CM | POA: Diagnosis not present

## 2021-05-31 DIAGNOSIS — J449 Chronic obstructive pulmonary disease, unspecified: Secondary | ICD-10-CM | POA: Diagnosis not present

## 2021-05-31 DIAGNOSIS — Z20822 Contact with and (suspected) exposure to covid-19: Secondary | ICD-10-CM | POA: Diagnosis not present

## 2021-05-31 DIAGNOSIS — I130 Hypertensive heart and chronic kidney disease with heart failure and stage 1 through stage 4 chronic kidney disease, or unspecified chronic kidney disease: Secondary | ICD-10-CM | POA: Diagnosis not present

## 2021-05-31 DIAGNOSIS — E039 Hypothyroidism, unspecified: Secondary | ICD-10-CM | POA: Diagnosis not present

## 2021-05-31 DIAGNOSIS — N189 Chronic kidney disease, unspecified: Secondary | ICD-10-CM | POA: Diagnosis not present

## 2021-05-31 DIAGNOSIS — I2510 Atherosclerotic heart disease of native coronary artery without angina pectoris: Secondary | ICD-10-CM | POA: Diagnosis not present

## 2021-05-31 LAB — CBC
HCT: 34.1 % — ABNORMAL LOW (ref 36.0–46.0)
Hemoglobin: 10.2 g/dL — ABNORMAL LOW (ref 12.0–15.0)
MCH: 28.9 pg (ref 26.0–34.0)
MCHC: 29.9 g/dL — ABNORMAL LOW (ref 30.0–36.0)
MCV: 96.6 fL (ref 80.0–100.0)
Platelets: 383 10*3/uL (ref 150–400)
RBC: 3.53 MIL/uL — ABNORMAL LOW (ref 3.87–5.11)
RDW: 16 % — ABNORMAL HIGH (ref 11.5–15.5)
WBC: 10.1 10*3/uL (ref 4.0–10.5)
nRBC: 0 % (ref 0.0–0.2)

## 2021-05-31 LAB — BASIC METABOLIC PANEL
Anion gap: 7 (ref 5–15)
BUN: 48 mg/dL — ABNORMAL HIGH (ref 8–23)
CO2: 23 mmol/L (ref 22–32)
Calcium: 8.6 mg/dL — ABNORMAL LOW (ref 8.9–10.3)
Chloride: 103 mmol/L (ref 98–111)
Creatinine, Ser: 1.53 mg/dL — ABNORMAL HIGH (ref 0.44–1.00)
GFR, Estimated: 34 mL/min — ABNORMAL LOW (ref >60)
Glucose, Bld: 330 mg/dL — ABNORMAL HIGH (ref 70–99)
Potassium: 5.5 mmol/L — ABNORMAL HIGH (ref 3.5–5.1)
Sodium: 133 mmol/L — ABNORMAL LOW (ref 135–145)

## 2021-05-31 LAB — GLUCOSE, CAPILLARY
Glucose-Capillary: 299 mg/dL — ABNORMAL HIGH (ref 70–99)
Glucose-Capillary: 337 mg/dL — ABNORMAL HIGH (ref 70–99)

## 2021-05-31 MED ORDER — DOXYCYCLINE HYCLATE 100 MG PO TABS: 100.0000 mg | ORAL_TABLET | Freq: Two times a day (BID) | ORAL | 0 refills | Status: AC

## 2021-05-31 MED ORDER — DOXYCYCLINE HYCLATE 100 MG PO TABS
100.0000 mg | ORAL_TABLET | Freq: Two times a day (BID) | ORAL | Status: DC
  Administered 2021-05-31: 100 mg via ORAL
  Filled 2021-05-31: qty 1

## 2021-05-31 MED ORDER — HYDROCODONE-ACETAMINOPHEN 5-325 MG PO TABS: 1.0000 | ORAL_TABLET | Freq: Four times a day (QID) | ORAL | 0 refills | Status: DC | PRN

## 2021-05-31 NOTE — Plan of Care (Signed)
  Problem: Education: Goal: Knowledge of General Education information will improve Description: Including pain rating scale, medication(s)/side effects and non-pharmacologic comfort measures Outcome: Progressing   Problem: Activity: Goal: Risk for activity intolerance will decrease Outcome: Progressing   Problem: Pain Managment: Goal: General experience of comfort will improve Outcome: Progressing   

## 2021-05-31 NOTE — Plan of Care (Signed)
Patient dc'd, careplans complete

## 2021-05-31 NOTE — Evaluation (Signed)
Physical Therapy Evaluation Patient Details Name: Kayla [name] MRN: [id number] DOB: Jun 09, 1940 Today's Date: 05/31/2021  History of Present Illness  81 yo female admitted with R knee septic bursitis. S/P I&D 05/30/21. Hx of obesity, DM, OSA, COPD, HF, CKD, cellultis, L great toe amputation, L TKA, chronic pain, CAD  Clinical Impression  On eval, pt was Supv for mobility. She walked ~125 feet with a RW and negotiated 2 stairs with use of railing. Pt rated pain 4/10. Encouraged her to perform knee ROM exercises-pt stated she is familiar with exercises since she had a previous TKA. Encouraged her to walk often at home. All education completed.        Recommendations for follow up therapy are one component of a multi-disciplinary discharge planning process, led by the attending physician.  Recommendations may be updated based on patient status, additional functional criteria and insurance authorization.  Follow Up Recommendations No PT follow up    Assistance Recommended at Discharge Intermittent Supervision/Assistance  Patient can return home with the following  Help with stairs or ramp for entrance;Assist for transportation    Equipment Recommendations None recommended by PT  Recommendations for Other Services       Functional Status Assessment Patient has had a recent decline in their functional status and demonstrates the ability to make significant improvements in function in a reasonable and predictable amount of time.     Precautions / Restrictions Precautions Precautions: None Restrictions Weight Bearing Restrictions: No RLE Weight Bearing: Weight bearing as tolerated      Mobility  Bed Mobility               General bed mobility comments: oob in recliner    Transfers Overall transfer level: Modified independent                      Ambulation/Gait Ambulation/Gait assistance: Supervision Gait Distance (Feet): 125 Feet Assistive device: Rolling  walker (2 wheels) Gait Pattern/deviations: Step-through pattern       General Gait Details: Supv for safety. No LOB with RW use. Pt tolerated distance well.  Stairs Stairs: Yes Stairs assistance: Min assist Stair Management: Step to pattern, Forwards, One rail Right Number of Stairs: 2 General stair comments: Min A to manage RW only. Pt was physically able to ascend/descend stairs on her own with use of railing.  Wheelchair Mobility    Modified Rankin (Stroke Patients Only)       Balance Overall balance assessment: Mild deficits observed, not formally tested                                           Pertinent Vitals/Pain Pain Assessment Pain Assessment: 0-10 Pain Score: 4  Pain Location: R knee Pain Descriptors / Indicators: Sore Pain Intervention(s): Monitored during session    Home Living Family/patient expects to be discharged to:: Private residence Living Arrangements: Spouse/significant other Available Help at Discharge: Family;Available 24 hours/day Type of Home: House Home Access: Stairs to enter Entrance Stairs-Rails: Right Entrance Stairs-Number of Steps: 2   Home Layout: One level Home Equipment: Rollator (4 wheels);Rolling Walker (2 wheels);Cane - single point;Shower seat      Prior Function               Mobility Comments: Uses a walker for mobility ADLs Comments: Husband assists with donning socks and shoes as well as tub transfers.  husband performs majority of IADLs     Hand Dominance   Dominant Hand: Right    Extremity/Trunk Assessment   Upper Extremity Assessment Upper Extremity Assessment: Overall WFL for tasks assessed    Lower Extremity Assessment Lower Extremity Assessment: Generalized weakness    Cervical / Trunk Assessment Cervical / Trunk Assessment: Normal  Communication   Communication: No difficulties  Cognition Arousal/Alertness: Awake/alert Behavior During Therapy: WFL for tasks  assessed/performed Overall Cognitive Status: Within Functional Limits for tasks assessed                                          General Comments      Exercises     Assessment/Plan    PT Assessment Patient needs continued PT services  PT Problem List         PT Treatment Interventions DME instruction;Gait training;Therapeutic activities;Therapeutic exercise;Patient/family education;Balance training;Functional mobility training;Stair training    PT Goals (Current goals can be found in the Care Plan section)  Acute Rehab PT Goals Patient Stated Goal: home! PT Goal Formulation: With patient Time For Goal Achievement: 06/14/21 Potential to Achieve Goals: Good    Frequency Min 3X/week     Co-evaluation               AM-PAC PT "6 Clicks" Mobility  Outcome Measure Help needed turning from your back to your side while in a flat bed without using bedrails?: None Help needed moving from lying on your back to sitting on the side of a flat bed without using bedrails?: None Help needed moving to and from a bed to a chair (including a wheelchair)?: None Help needed standing up from a chair using your arms (e.g., wheelchair or bedside chair)?: None Help needed to walk in hospital room?: A Little Help needed climbing 3-5 steps with a railing? : A Little 6 Click Score: 22    End of Session   Activity Tolerance: Patient tolerated treatment well Patient left: in chair;with call bell/phone within reach   PT Visit Diagnosis: Pain Pain - Right/Left: Right Pain - part of body: Knee    Time: 6378-5885 PT Time Calculation (min) (ACUTE ONLY): 9 min   Charges:   PT Evaluation $PT Eval Low Complexity: [place], PT Acute Rehabilitation  Office: [phone] Pager: [phone]

## 2021-05-31 NOTE — Progress Notes (Signed)
Patient ID: Kayla [name], female   DOB: [birth year], 81 y.o.   MRN: [id number] Subjective: 1 Day Post-Op Procedure(s) (LRB): IRRIGATION AND DEBRIDEMENT OF PREPATELLA BURSA (Right)    Patient reports pain as mild.  No events over night  Objective:   VITALS:   Vitals:   05/31/21 0519 05/31/21 0833  BP: (!) 144/72   Pulse: (!) 59   Resp: 19   Temp: 97.8 F (36.6 C)   SpO2: 95% 96%    Neurovascular intact Incision: dressing C/D/I  LABS Recent Labs    05/30/21 1023 05/31/21 0256  HGB 11.1* 10.2*  HCT 35.4* 34.1*  WBC 15.9* 10.1  PLT 517* 383    Recent Labs    05/30/21 1023 05/31/21 0256  NA 138 133*  K 4.2 5.5*  BUN 52* 48*  CREATININE 1.49* 1.53*  GLUCOSE 109* 330*    No results for input(s): LABPT, INR in the last 72 hours.   Assessment/Plan: 1 Day Post-Op Procedure(s) (LRB): IRRIGATION AND DEBRIDEMENT OF PREPATELLA BURSA (Right)   Plan: I reviewed the case and aspiration results with ID on call We will discharge her on Doxycycline based on sensitivities and to minimize risks of developing C. Dif RTC in 2 weeks for wound check and suture removal

## 2021-05-31 NOTE — Op Note (Signed)
NAME: Kayla [name], Kayla [name] MEDICAL RECORD NO: [id number] ACCOUNT NO: [id number] DATE OF BIRTH: 22-Jul-1940 FACILITY: [facility]ATION: [id number] PHYSICIAN: Pietro Cassis. Alvan Dame, MD  Operative Report   DATE OF PROCEDURE: 05/30/2021  PREOPERATIVE DIAGNOSIS:  Infected right prepatellar bursa.  POSTOPERATIVE DIAGNOSIS:  Infected right prepatellar bursa.  PROCEDURES: 1.  Excisional and non-excisional debridement of right prepatellar bursa. 2.  Excisional debridement consisted of sharp excisional debridement of an incision of 3 inches with the use of the knife as well as Bovie cautery of infected bursal tissue and nonviable tissue followed by non-excisional debridement with 2 liters of  normal saline solution with pulse lavage and primary wound closure.  SURGEON:  Pietro Cassis. Alvan Dame, MD  ASSISTANT:  Costella Hatcher, PA-C.  Note that [name] was present for the entirety of the case for preoperative positioning perioperative management of the operative extremity, general facilitation of the case and primary wound closure.  ANESTHESIA:  Regional plus general.  ESTIMATED BLOOD LOSS:  Less than 50 mL.  DRAINS:  None.  COMPLICATIONS:  None.  TOURNIQUET: Up for 4 minutes at 225 mmHg.  INDICATIONS:  The patient is a pleasant 81 year old female who had presented to the office a week prior with pain and swelling in her lower extremity.  She had a history somewhat remote of a laceration involving the proximal aspect of her knee.  It was  closed in an urgent care.  She had healed that without consequence, but then presented several weeks later with swelling and warmth in the prepatellar region.  She also had some lower extremity edema associated with mild erythematous changes consistent  with mild cellulitic changes.  In the office, I did aspirate the fluid and sent it to be evaluated.  There was concern for growth of Staph epi.  For that reason, she was scheduled for surgery to decompress this area more formally.   Risks and benefits  discussed and reviewed.  Primary risks would be recurrence of infection and need for further surgery, whereas benefits are to treat this infection.  DESCRIPTION OF PROCEDURE:  The patient was brought to the operative theater.  Once adequate anesthesia, preoperative antibiotics, which did include Ancef were administered.  She was positioned supine with a right thigh tourniquet placed.  The right lower  extremity was then prepped and draped in sterile fashion.  A time-out was performed identifying the patient, planned procedure, and extremity.  Once this was done, the right lower extremity was exsanguinated and tourniquet elevated to 225 mmHg.  I made  an anterior based incision on the lower portion of her knee in the area of this swelling.  There was not an extreme amount of pus in this area; however, the tissue did appear to be nonviable including the remaining bursal tissue.  I used the scalpel and  the Bovie to perform an excisional debridement of the soft tissues involved in this 3-inch area.  I circumferentially debrided this area.  Once this was done, I let the tourniquet down to try to obtain hemostasis as best as possible due to her Eliquis  use.  We then irrigated the knee with 2 liters of normal saline solution.  The area was significantly decompressed.  Based on this, we used closure with 2-0 nylon sutures in a horizontal mattress interrupted fashion.  The wound was then cleaned, dried,  dressed sterilely with Adaptic and a bulky dressing.  She was then brought to the recovery room in stable condition, tolerating the procedure well.  Postoperatively, we  will keep in the hospital overnight.  I will discuss the case with Infectious Disease and get a verbal consult on antibiotic selection due to the Staph epi found in her wound.  POSTOPERATIVE PLAN:  She will be discharged on antibiotics for a 10-14 day period of time.  We will see her back in the office in 2 weeks for suture  removal.   PUS D: 05/31/2021 9:06:26 am T: 05/31/2021 11:38:00 am  JOB: [id number]/ [id number]

## 2021-05-31 NOTE — Brief Op Note (Signed)
05/30/2021  5:15 PM  PATIENT:  Kayla [name]  81 y.o. female  PRE-OPERATIVE DIAGNOSIS:  infection of right prepatella bursa  POST-OPERATIVE DIAGNOSIS:  infection of right prepatella bursa  PROCEDURE:  Procedure(s) with comments: IRRIGATION AND DEBRIDEMENT OF PREPATELLA BURSA (Right) - 60 wants standard knee draping and pulse lavage  SURGEON:  Surgeon(s) and Role:    Paralee Cancel, MD - Primary  PHYSICIAN ASSISTANT: Costella Hatcher, PA-C  ANESTHESIA:   regional and general  EBL:  <50 cc  BLOOD ADMINISTERED:none  DRAINS: none   LOCAL MEDICATIONS USED:  NONE  SPECIMEN:  No Specimen  DISPOSITION OF SPECIMEN:  N/A  COUNTS:  YES  TOURNIQUET:   Total Tourniquet Time Documented: Thigh (Right) - 4 minutes Total: Thigh (Right) - 4 minutes   DICTATION: .Other Dictation: Dictation Number [id number]  PLAN OF CARE: Admit for overnight observation  PATIENT DISPOSITION:  PACU - hemodynamically stable.   Delay start of Pharmacological VTE agent (>24hrs) due to surgical blood loss or risk of bleeding: no

## 2021-06-09 ENCOUNTER — Encounter: Payer: Self-pay | Admitting: Pulmonary Disease

## 2021-06-09 ENCOUNTER — Other Ambulatory Visit: Payer: Self-pay

## 2021-06-09 ENCOUNTER — Ambulatory Visit: Payer: Medicare HMO | Admitting: Pulmonary Disease

## 2021-06-09 VITALS — BP 130/70 | HR 78 | Temp 97.9°F | Ht 65.0 in | Wt 232.0 lb

## 2021-06-09 DIAGNOSIS — G4733 Obstructive sleep apnea (adult) (pediatric): Secondary | ICD-10-CM | POA: Diagnosis not present

## 2021-06-09 DIAGNOSIS — Z9989 Dependence on other enabling machines and devices: Secondary | ICD-10-CM | POA: Diagnosis not present

## 2021-06-09 DIAGNOSIS — J449 Chronic obstructive pulmonary disease, unspecified: Secondary | ICD-10-CM | POA: Diagnosis not present

## 2021-06-09 NOTE — Progress Notes (Signed)
[facility]  Chief Complaint  Patient presents with   Follow-up    COPD follow up. Pt states that she is not doing good this visit and she is having more flare ups. Pt states that the Kayla [name] is not doing much good.    Past Surgical History:  She  has a past surgical history that includes Thyroidectomy (2011); Total knee arthroplasty (07/27/2011); Abdominal hysterectomy; Anterior cervical decomp/discectomy fusion (N/A, 11/07/2014); Cardiac catheterization (N/A, 11/08/2015); Lumbar disc surgery (1990's); Cholecystectomy (N/A, 04/29/2016); Upper esophageal endoscopic ultrasound (eus) (N/A, 05/14/2016); Cataract extraction w/ intraocular lens  implant, bilateral (Bilateral); Insert / replace / remove pacemaker (2017); Cardiac catheterization; Back surgery; Appendectomy; Joint replacement; Artery Biopsy (Right, 02/16/2017); Amputation toe (Left, 12/15/2019); Artery Biopsy (Left, 04/16/2020); and Incision and drainage abscess (Right, 05/30/2021).  Past Medical History:  Hypothyroidism s/p thyroidectomy, HTN, HLD, HA, GERD, Diverticulosis, DM, Cataract, CAD, OA, Anxiety, Allergies  Constitutional:  BP 130/70 (BP Location: Left Arm, Patient Position: Sitting, Cuff Size: Normal)    Pulse 78    Temp 97.9 F (36.6 C) (Oral)    Ht '5\' 5"'  (1.651 m)    Wt 232 lb (105.2 kg)    SpO2 96%    BMI 38.61 kg/m   Brief Summary:  Kayla [name] is a 81 y.o. female former smoker with COPD and obstructive sleep apnea.      Subjective:   She doesn't feel like she can get breztri into her lungs effectively.  Feels like nebulizer works better.  Hasn't been using her spacer.  Gets occasional cough with clear sputum.  Has been on several rounds of antibiotics for right knee infection.    Uses CPAP nightly.  No issues with mask fit.  Physical Exam:   Appearance - well kempt   ENMT - no sinus tenderness, no oral exudate, no LAN, Mallampati 3 airway, no stridor, wears  dentures  Respiratory - decreased breath sounds bilaterally, no wheezing or rales  CV - s1s2 regular rate and rhythm, no murmurs  Ext - no clubbing, no edema  Skin - no rashes  Psych - normal mood and affect     Pulmonary testing:  Spirometry 10/19/08 >> FEV1 1.55 (68%), FEV1% 70  Spirometry 08/29/12 >> FEV1 1.38 (59%), FEV1% 58 FeNO 03/09/16 >> 7  Chest Imaging:  HRCT chest 11/16/20 >> atherosclerosis, scattered areas of scarring, several tiny nodules up to 2 mm  Sleep Tests:  PSG 02/2005 >> AHI 10  CPAP titration 02/19/14 >> CPAP 11 cm H2O >> AHI 0.7, PLMI 81.4, did not need supplemental oxygen Auto CPAP 05/07/21 to 06/05/21 >> used on 29 of 30 nights with average 11 hrs 13 min.  Average AHI 2 with median CPAP 6 and 95 th percentile CPAP 10 cm H2O  Cardiac Tests:  Echo 10/03/20 >> EF 60 to 65%, severe LVH, grade 2 DD  Social History:  She  reports that she quit smoking about 24 years ago. Her smoking use included cigarettes. She started smoking about 57 years ago. She has a 66.00 pack-year smoking history. She has never used smokeless tobacco. She reports that she does not drink alcohol and does not use drugs.  Family History:  Her family history includes Diabetes in her maternal aunt and maternal uncle; Heart Problems in her father and mother; Heart defect in her mother; Heart disease in her brother; Lung cancer in her daughter; Throat cancer in her brother.     Assessment/Plan:   COPD with  chronic bronchitis. - intolerant of trelegy, incruse and daliresp - spiriva caused worsening cough - advised her to try using Breztri with a spacer device - if she still has difficulty, then could try changing her to nebulizer medicine - prn albuterol  Obstructive sleep apnea. - she is compliant with CPAP and reports benefit from CPAP - she uses [employer] for her DME - continue auto CPAP 5 to 15 cm H2O   Obesity. - discussed importance of weight loss  Sinus node dysfunction, PAF,  S/P PM, Chronic diastolic CHF. - followed by Dr. Caryl Comes with [facility]  Rt knee infection. - she is followed by Dr. Adriana Mccallum with Orthopedics  Time Spent Involved in Patient Care on Day of Examination:  36 minutes  Follow up:   Patient Instructions  Try using your spacer device with Breztri  Follow up in 6 months  Medication List:   Allergies as of 06/09/2021       Reactions   Dulaglutide Nausea Only, Other (See Comments), Cough   Made the patient sick to her stomach (only) several days after using it Trulicity   Oxycodone Itching   Pt still takes this med even thought it causes itching   Trelegy Ellipta [fluticasone-umeclidin-vilant] Other (See Comments)   unknown        Medication List        Accurate as of June 09, 2021 10:08 AM. If you have any questions, ask your nurse or doctor.          acetaminophen 500 MG tablet Commonly known as: TYLENOL Take 1,000 mg by mouth every 6 (six) hours as needed for moderate pain (for headaches).   albuterol (2.5 MG/3ML) 0.083% nebulizer solution Commonly known as: PROVENTIL Take 3 mLs (2.5 mg total) by nebulization every 2 (two) hours as needed for wheezing or shortness of breath. What changed: when to take this   albuterol 108 (90 Base) MCG/ACT inhaler Commonly known as: Ventolin HFA Inhale 2 puffs into the lungs every 6 (six) hours as needed for wheezing or shortness of breath. What changed: Another medication with the same name was changed. Make sure you understand how and when to take each.   allopurinol 100 MG tablet Commonly known as: ZYLOPRIM Take 100 mg by mouth 2 (two) times daily.   amiodarone 200 MG tablet Commonly known as: PACERONE Take 1 tablet (200 mg total) by mouth daily.   amLODipine 5 MG tablet Commonly known as: NORVASC Take 1 tablet (5 mg total) by mouth daily.   ARTIFICIAL TEARS OP Place 1 drop into both eyes daily as needed (Retna damage).   ascorbic acid 500 MG tablet Commonly  known as: VITAMIN C Take 500 mg by mouth every morning.   atorvastatin 40 MG tablet Commonly known as: LIPITOR TAKE 1 TABLET EVERY DAY   B-D SINGLE USE SWABS REGULAR Pads   benzonatate 200 MG capsule Commonly known as: TESSALON Take 200 mg by mouth 3 (three) times daily as needed for cough.   Breztri Aerosphere 160-9-4.8 MCG/ACT Aero Generic drug: Budeson-Glycopyrrol-Formoterol INHALE 2 PUFFS INTO THE LUNGS IN THE MORNING AND AT BEDTIME.   doxycycline 100 MG tablet Commonly known as: VIBRA-TABS Take 1 tablet (100 mg total) by mouth every 12 (twelve) hours for 14 days.   Eliquis 5 MG Tabs tablet Generic drug: apixaban TAKE 1 TABLET TWICE DAILY What changed: how much to take   ferrous sulfate 325 (65 FE) MG tablet Take 1 tablet (325 mg total) by mouth daily with  breakfast.   fluticasone 50 MCG/ACT nasal spray Commonly known as: FLONASE USE 2 SPRAYS NASALLY DAILY What changed:  how much to take how to take this when to take this reasons to take this additional instructions   gabapentin 800 MG tablet Commonly known as: NEURONTIN Take 800 mg by mouth in the morning, at noon, in the evening, and at bedtime.   HYDROcodone-acetaminophen 5-325 MG tablet Commonly known as: NORCO/VICODIN Take 1 tablet by mouth every 6 (six) hours as needed for severe pain.   insulin aspart 100 UNIT/ML FlexPen Commonly known as: NOVOLOG Inject 5-15 Units into the skin 3 (three) times daily with meals. Per sliding scale (and an additional 1 unit for every 50 points for a BGL >130) Sliding scale   insulin glargine 100 UNIT/ML injection Commonly known as: LANTUS Inject 110 Units into the skin in the morning and at bedtime. Unless if she has a sugar low then will take less   Laxative 25 MG Tabs Generic drug: Sennosides Take 25 mg by mouth daily as needed (Constipation). [employer] brand   levothyroxine 112 MCG tablet Commonly known as: SYNTHROID Take 224 mcg by mouth daily.    losartan 50 MG tablet Commonly known as: COZAAR Take 25 mg by mouth daily.   metoprolol succinate 100 MG 24 hr tablet Commonly known as: TOPROL-XL TAKE 1 TABLET EVERY DAY WITH OR IMMEDIATELY AFTER A MEAL What changed: See the new instructions.   naloxone 4 MG/0.1ML Liqd nasal spray kit Commonly known as: NARCAN Place 4 mg into the nose once as needed for opioid reversal.   nitroGLYCERIN 0.4 MG SL tablet Commonly known as: NITROSTAT Place 1 tablet (0.4 mg total) under the tongue every 5 (five) minutes as needed for chest pain.   polyethylene glycol 17 g packet Commonly known as: MIRALAX / GLYCOLAX Take 17 g by mouth daily as needed for mild constipation.   PRESCRIPTION MEDICATION 1 Dose by Other route at bedtime. CPAP: At bedtime and "as needed for shortness of breath"   torsemide 20 MG tablet Commonly known as: DEMADEX Take 80 mg by mouth daily.   True Metrix Blood Glucose Test test strip Generic drug: glucose blood   True Metrix Level 1 Low Soln   TRUEplus Lancets 28G Misc   TUSSIN DM MAX SUGAR-FREE PO Take 5 mLs by mouth every 4 (four) hours as needed (for coughing).   vitamin B-12 500 MCG tablet Commonly known as: CYANOCOBALAMIN Take 500 mcg by mouth daily.   Vitamin D3 25 MCG tablet Commonly known as: Vitamin D Take 1,000 Units by mouth every morning.        Signature:  Chesley Mires, MD [place] Pager - [phone] 06/09/2021, 10:08 AM

## 2021-06-09 NOTE — Patient Instructions (Signed)
Try using your spacer device with Breztri  Follow up in 6 months

## 2021-06-12 NOTE — Discharge Summary (Signed)
Patient ID: Kayla [name] MRN: [id number] DOB/AGE: [birth year] 81 y.o.  Admit date: 05/30/2021 Discharge date: 05/31/2021  Admission Diagnoses:  Infected right prepatellar bursa.  Discharge Diagnoses:  Principal Problem:   Septic prepatellar bursitis of right knee   Past Medical History:  Diagnosis Date   Allergy    Anxiety    Arthritis    CAD (coronary artery disease)    Mild per remote cath in 2006, /    Nuclear, January, 2013, no significant abnormality,   Carotid artery disease ([place])    Doppler, January, 2013, 0 53% RIC A., 66-44% LICA, stable   Cataract    CHF (congestive heart failure) ([place])    Chronic kidney disease    Complication of anesthesia    wakes up "mean"   COPD 02/16/2007   Intolerant of Spiriva, tudorza Intolerant of Trelegy    COPD (chronic obstructive pulmonary disease) ([place])    Coronary atherosclerosis 11/24/2008   Catheterization 2006, nonobstructive coronary disease   //   nuclear 2013, low risk     Diabetes mellitus, type 2 ([place])    Diverticula of colon    DIVERTICULOSIS OF COLON 03/23/2007   Qualifier: Diagnosis of  By: Halford Chessman MD, Vineet     Dizziness    occasional   Dizzy spells 05/04/2011   DM (diabetes mellitus) ([place]) 03/23/2007   Qualifier: Diagnosis of  By: Halford Chessman MD, Vineet     Dyspnea    On exertion   Ejection fraction    Essential hypertension 02/16/2007   Qualifier: Diagnosis of  By: Rosana Hoes CMA, Tammy     G E R D 03/23/2007   Qualifier: Diagnosis of  By: Halford Chessman MD, Vineet     Gall bladder disease 04/28/2016   Gastritis and gastroduodenitis    GERD (gastroesophageal reflux disease)    Goiter    hx of   Headache(784.0)    migraine   Hyperlipidemia    Hypertension    Hypothyroidism    HYPOTHYROIDISM, POSTSURGICAL 06/04/2008   Qualifier: Diagnosis of  By: Loanne Drilling MD, Sean A    Left ventricular hypertrophy    Leg edema    occasional swelling   Leg swelling 12/09/2018   Myofascial pain syndrome, cervical 01/06/2018   Obesity     OBESITY 11/24/2008   Qualifier: Diagnosis of  By: Sidney Ace     OBSTRUCTIVE SLEEP APNEA 02/16/2007   Auto CPAP 09/03/13 to 10/02/13 >> used on 30 of 30 nights with average 6 hrs and 3 min.  Average AHI is 3.1 with median CPAP 5 cm H2O and 95 th percentile CPAP 6 cm H2O.     Occipital neuralgia of right side 01/06/2018   OSA (obstructive sleep apnea)    cpap setting of 12   Pain in joint of right hip 01/21/2018   Palpitations 01/21/2011   48 hour Holter, 2015, scattered PACs and PVCs.    Paroxysmal atrial fibrillation (HCC)    Pneumonia    PONV (postoperative nausea and vomiting)    severe after every surgery   Pulmonary hypertension, secondary    RHINITIS 07/21/2010        RUQ abdominal pain    S/P left TKA 07/27/2011   Sinus node dysfunction ([place]) 11/08/2015   Sleep apnea    Spinal stenosis of cervical region 11/07/2014   Urinary frequency 12/09/2018   UTI (urinary tract infection) 08/15/2019   per pt report   VENTRICULAR HYPERTROPHY, LEFT 11/24/2008   Qualifier: Diagnosis of  By: Sidney Ace  Surgeries: Procedure(s): IRRIGATION AND DEBRIDEMENT OF PREPATELLA BURSA on 05/30/2021   Consultants:   Discharged Condition: Improved  Hospital Course: Kayla [name] is an 81 y.o. female who was admitted 05/30/2021 for operative treatment ofSeptic prepatellar bursitis of right knee. Patient has severe unremitting pain that affects sleep, daily activities, and work/hobbies. After pre-op clearance the patient was taken to the operating room on 05/30/2021 and underwent  Procedure(s): IRRIGATION AND DEBRIDEMENT OF PREPATELLA BURSA.    Patient was given perioperative antibiotics:  Anti-infectives (From admission, onward)    Start     Dose/Rate Route Frequency Ordered Stop   05/31/21 1000  doxycycline (VIBRA-TABS) tablet 100 mg  Status:  Discontinued        100 mg Oral Every 12 hours 05/31/21 0858 05/31/21 1732   05/31/21 0000  doxycycline (VIBRA-TABS) 100 MG tablet        100  mg Oral Every 12 hours 05/31/21 1134 06/14/21 2359   05/30/21 2200  clindamycin (CLEOCIN) capsule 150 mg  Status:  Discontinued        150 mg Oral Every 8 hours 05/30/21 1748 05/31/21 0858   05/30/21 1015  ceFAZolin (ANCEF) IVPB 2g/100 mL premix        2 g 200 mL/hr over 30 Minutes Intravenous On call to O.R. 05/30/21 1002 05/30/21 1648        Patient was given sequential compression devices, early ambulation, and chemoprophylaxis to prevent DVT. Patient worked with PT and was meeting their goals regarding safe ambulation and transfers.  Patient benefited maximally from hospital stay and there were no complications.    Recent vital signs: No data found.   Recent laboratory studies: No results for input(s): WBC, HGB, HCT, PLT, NA, K, CL, CO2, BUN, CREATININE, GLUCOSE, INR, CALCIUM in the last 72 hours.  Invalid input(s): PT, 2   Discharge Medications:   Allergies as of 05/31/2021       Reactions   Dulaglutide Nausea Only, Other (See Comments), Cough   Made the patient sick to her stomach (only) several days after using it Trulicity   Oxycodone Itching   Pt still takes this med even thought it causes itching   Trelegy Ellipta [fluticasone-umeclidin-vilant] Other (See Comments)   unknown        Medication List     STOP taking these medications    cephALEXin 500 MG capsule Commonly known as: KEFLEX       TAKE these medications    acetaminophen 500 MG tablet Commonly known as: TYLENOL Take 1,000 mg by mouth every 6 (six) hours as needed for moderate pain (for headaches).   albuterol (2.5 MG/3ML) 0.083% nebulizer solution Commonly known as: PROVENTIL Take 3 mLs (2.5 mg total) by nebulization every 2 (two) hours as needed for wheezing or shortness of breath. What changed: when to take this   albuterol 108 (90 Base) MCG/ACT inhaler Commonly known as: Ventolin HFA Inhale 2 puffs into the lungs every 6 (six) hours as needed for wheezing or shortness of breath. What  changed: Another medication with the same name was changed. Make sure you understand how and when to take each.   allopurinol 100 MG tablet Commonly known as: ZYLOPRIM Take 100 mg by mouth 2 (two) times daily.   amiodarone 200 MG tablet Commonly known as: PACERONE Take 1 tablet (200 mg total) by mouth daily.   amLODipine 5 MG tablet Commonly known as: NORVASC Take 1 tablet (5 mg total) by mouth daily.   ARTIFICIAL TEARS OP Place  1 drop into both eyes daily as needed ([place] damage).   ascorbic acid 500 MG tablet Commonly known as: VITAMIN C Take 500 mg by mouth every morning.   atorvastatin 40 MG tablet Commonly known as: LIPITOR TAKE 1 TABLET EVERY DAY   B-D SINGLE USE SWABS REGULAR Pads   benzonatate 200 MG capsule Commonly known as: TESSALON Take 200 mg by mouth 3 (three) times daily as needed for cough.   Breztri Aerosphere 160-9-4.8 MCG/ACT Aero Generic drug: Budeson-Glycopyrrol-Formoterol INHALE 2 PUFFS INTO THE LUNGS IN THE MORNING AND AT BEDTIME.   doxycycline 100 MG tablet Commonly known as: VIBRA-TABS Take 1 tablet (100 mg total) by mouth every 12 (twelve) hours for 14 days.   Eliquis 5 MG Tabs tablet Generic drug: apixaban TAKE 1 TABLET TWICE DAILY What changed: how much to take   ferrous sulfate 325 (65 FE) MG tablet Take 1 tablet (325 mg total) by mouth daily with breakfast.   fluticasone 50 MCG/ACT nasal spray Commonly known as: FLONASE USE 2 SPRAYS NASALLY DAILY What changed:  how much to take how to take this when to take this reasons to take this additional instructions   gabapentin 800 MG tablet Commonly known as: NEURONTIN Take 800 mg by mouth in the morning, at noon, in the evening, and at bedtime.   HYDROcodone-acetaminophen 5-325 MG tablet Commonly known as: NORCO/VICODIN Take 1 tablet by mouth every 6 (six) hours as needed for severe pain. What changed:  when to take this reasons to take this   insulin aspart 100 UNIT/ML  FlexPen Commonly known as: NOVOLOG Inject 5-15 Units into the skin 3 (three) times daily with meals. Per sliding scale (and an additional 1 unit for every 50 points for a BGL >130) Sliding scale   insulin glargine 100 UNIT/ML injection Commonly known as: LANTUS Inject 110 Units into the skin in the morning and at bedtime. Unless if she has a sugar low then will take less   Laxative 25 MG Tabs Generic drug: Sennosides Take 25 mg by mouth daily as needed (Constipation). [employer] brand   levothyroxine 112 MCG tablet Commonly known as: SYNTHROID Take 224 mcg by mouth daily.   losartan 50 MG tablet Commonly known as: COZAAR Take 25 mg by mouth daily.   metoprolol succinate 100 MG 24 hr tablet Commonly known as: TOPROL-XL TAKE 1 TABLET EVERY DAY WITH OR IMMEDIATELY AFTER A MEAL What changed: See the new instructions.   naloxone 4 MG/0.1ML Liqd nasal spray kit Commonly known as: NARCAN Place 4 mg into the nose once as needed for opioid reversal.   nitroGLYCERIN 0.4 MG SL tablet Commonly known as: NITROSTAT Place 1 tablet (0.4 mg total) under the tongue every 5 (five) minutes as needed for chest pain.   polyethylene glycol 17 g packet Commonly known as: MIRALAX / GLYCOLAX Take 17 g by mouth daily as needed for mild constipation.   PRESCRIPTION MEDICATION 1 Dose by Other route at bedtime. CPAP: At bedtime and "as needed for shortness of breath"   torsemide 20 MG tablet Commonly known as: DEMADEX Take 80 mg by mouth daily.   True Metrix Blood Glucose Test test strip Generic drug: glucose blood   True Metrix Level 1 Low Soln   TRUEplus Lancets 28G Misc   TUSSIN DM MAX SUGAR-FREE PO Take 5 mLs by mouth every 4 (four) hours as needed (for coughing).   vitamin B-12 500 MCG tablet Commonly known as: CYANOCOBALAMIN Take 500 mcg by mouth daily.  Vitamin D3 25 MCG tablet Commonly known as: Vitamin D Take 1,000 Units by mouth every morning.                Discharge Care Instructions  (From admission, onward)           Start     Ordered   05/31/21 0000  Change dressing       Comments: Maintain surgical dressing until follow up in the clinic. If the edges start to pull up, may reinforce with tape. If the dressing is no longer working, may remove and cover with gauze and tape, but must keep the area dry and clean.  Call with any questions or concerns.   05/31/21 1135            Diagnostic Studies: No results found.  Disposition: Discharge disposition: 01-Home or Self Care       Discharge Instructions     Call MD / Call 911   Complete by: As directed    If you experience chest pain or shortness of breath, CALL 911 and be transported to the hospital emergency room.  If you develope a fever above 101 F, pus (white drainage) or increased drainage or redness at the wound, or calf pain, call your surgeon's office.   Change dressing   Complete by: As directed    Maintain surgical dressing until follow up in the clinic. If the edges start to pull up, may reinforce with tape. If the dressing is no longer working, may remove and cover with gauze and tape, but must keep the area dry and clean.  Call with any questions or concerns.   Constipation Prevention   Complete by: As directed    Drink plenty of fluids.  Prune juice may be helpful.  You may use a stool softener, such as Colace (over the counter) 100 mg twice a day.  Use MiraLax (over the counter) for constipation as needed.   Diet - low sodium heart healthy   Complete by: As directed    Increase activity slowly as tolerated   Complete by: As directed    Weight bearing as tolerated with assist device (walker, cane, etc) as directed, use it as long as suggested by your surgeon or therapist, typically at least 4-6 weeks.   Post-operative opioid taper instructions:   Complete by: As directed    POST-OPERATIVE OPIOID TAPER INSTRUCTIONS: It is important to wean off of your opioid  medication as soon as possible. If you do not need pain medication after your surgery it is ok to stop day one. Opioids include: Codeine, Hydrocodone(Norco, Vicodin), Oxycodone(Percocet, oxycontin) and hydromorphone amongst others.  Long term and even short term use of opiods can cause: Increased pain response Dependence Constipation Depression Respiratory depression And more.  Withdrawal symptoms can include Flu like symptoms Nausea, vomiting And more Techniques to manage these symptoms Hydrate well Eat regular healthy meals Stay active Use relaxation techniques(deep breathing, meditating, yoga) Do Not substitute Alcohol to help with tapering If you have been on opioids for less than two weeks and do not have pain than it is ok to stop all together.  Plan to wean off of opioids This plan should start within one week post op of your joint replacement. Maintain the same interval or time between taking each dose and first decrease the dose.  Cut the total daily intake of opioids by one tablet each day Next start to increase the time between doses. The last dose that should  be eliminated is the evening dose.      TED hose   Complete by: As directed    Use stockings (TED hose) for 2 weeks on both leg(s).  You may remove them at night for sleeping.        Follow-up Information     Paralee Cancel, MD. Schedule an appointment as soon as possible for a visit in 2 week(s).   Specialty: Orthopedic Surgery Contact information: [address] [place] [address] [phone]                  Signed: Irving Copas 06/12/2021, 12:50 PM

## 2021-06-18 ENCOUNTER — Ambulatory Visit: Payer: Self-pay

## 2021-06-18 ENCOUNTER — Other Ambulatory Visit: Payer: Self-pay

## 2021-06-18 NOTE — Patient Outreach (Signed)
[facility]) Care Management  06/18/2021  Kayla [name] Sep 16, 1940 [id number]   Telephone Assessment    Unsuccessful outreach attempt to patient. No answer. RN CM left HIPAA compliant voicemail message along with contact info.    Plan: RN CM will make outreach attempt within 4 business days if no return call.  Enzo Montgomery, RN,BSN,CCM [place] Management Telephonic Care Management Coordinator Direct Phone: [phone] Toll Free: [phone] Fax: [phone]

## 2021-06-20 ENCOUNTER — Other Ambulatory Visit: Payer: Self-pay

## 2021-06-20 NOTE — Patient Outreach (Signed)
[facility]) Care Management  06/20/2021  Kayla [name] May 29, 1940 [id number]   Telephone Assessment    Unsuccessful outreach attempt #2 to patient.      Plan:  Assigned RN CM will make outreach attempt to patient within the month of March   Kayla [name] [name], RN,BSN,CCM [place] [facility] Telephonic Care Management Coordinator Direct Phone: [phone] Toll Free: [phone] Fax: [phone]

## 2021-06-23 DIAGNOSIS — G894 Chronic pain syndrome: Secondary | ICD-10-CM | POA: Diagnosis not present

## 2021-06-23 DIAGNOSIS — Z1389 Encounter for screening for other disorder: Secondary | ICD-10-CM | POA: Diagnosis not present

## 2021-06-23 DIAGNOSIS — Z79891 Long term (current) use of opiate analgesic: Secondary | ICD-10-CM | POA: Diagnosis not present

## 2021-06-23 DIAGNOSIS — M353 Polymyalgia rheumatica: Secondary | ICD-10-CM | POA: Diagnosis not present

## 2021-06-23 DIAGNOSIS — M1710 Unilateral primary osteoarthritis, unspecified knee: Secondary | ICD-10-CM | POA: Diagnosis not present

## 2021-06-23 DIAGNOSIS — E349 Endocrine disorder, unspecified: Secondary | ICD-10-CM | POA: Diagnosis not present

## 2021-06-23 DIAGNOSIS — M549 Dorsalgia, unspecified: Secondary | ICD-10-CM | POA: Diagnosis not present

## 2021-06-23 DIAGNOSIS — M8580 Other specified disorders of bone density and structure, unspecified site: Secondary | ICD-10-CM | POA: Diagnosis not present

## 2021-06-25 ENCOUNTER — Other Ambulatory Visit: Payer: Self-pay

## 2021-06-25 MED ORDER — APIXABAN 2.5 MG PO TABS: 2.5000 mg | ORAL_TABLET | Freq: Two times a day (BID) | ORAL | 1 refills | Status: DC

## 2021-06-25 NOTE — Telephone Encounter (Signed)
Prescription refill request for Eliquis received. Indication: Afib  Last office visit: 02/10/21 [facility])  Scr: 1.53 (05/31/21)  Age: 81 Weight: 105.2kg  Per Fuller Canada, PharmD pt's dose should be decreased to 2.5mg  BID due to age and Scr. Appropriate dose and refill sent to requested pharmacy. Called pt and made her aware of dose change.

## 2021-06-30 DIAGNOSIS — I10 Essential (primary) hypertension: Secondary | ICD-10-CM | POA: Diagnosis not present

## 2021-06-30 DIAGNOSIS — E782 Mixed hyperlipidemia: Secondary | ICD-10-CM | POA: Diagnosis not present

## 2021-06-30 DIAGNOSIS — N184 Chronic kidney disease, stage 4 (severe): Secondary | ICD-10-CM | POA: Diagnosis not present

## 2021-06-30 DIAGNOSIS — N2581 Secondary hyperparathyroidism of renal origin: Secondary | ICD-10-CM | POA: Diagnosis not present

## 2021-06-30 DIAGNOSIS — E1140 Type 2 diabetes mellitus with diabetic neuropathy, unspecified: Secondary | ICD-10-CM | POA: Diagnosis not present

## 2021-06-30 DIAGNOSIS — E890 Postprocedural hypothyroidism: Secondary | ICD-10-CM | POA: Diagnosis not present

## 2021-06-30 DIAGNOSIS — E790 Hyperuricemia without signs of inflammatory arthritis and tophaceous disease: Secondary | ICD-10-CM | POA: Diagnosis not present

## 2021-06-30 DIAGNOSIS — I5030 Unspecified diastolic (congestive) heart failure: Secondary | ICD-10-CM | POA: Diagnosis not present

## 2021-07-02 ENCOUNTER — Ambulatory Visit: Payer: Medicare HMO | Admitting: Podiatry

## 2021-07-02 ENCOUNTER — Telehealth: Payer: Self-pay | Admitting: Internal Medicine

## 2021-07-02 ENCOUNTER — Encounter: Payer: Self-pay | Admitting: Podiatry

## 2021-07-02 ENCOUNTER — Other Ambulatory Visit: Payer: Self-pay

## 2021-07-02 DIAGNOSIS — B351 Tinea unguium: Secondary | ICD-10-CM | POA: Diagnosis not present

## 2021-07-02 DIAGNOSIS — M79676 Pain in unspecified toe(s): Secondary | ICD-10-CM

## 2021-07-02 DIAGNOSIS — I872 Venous insufficiency (chronic) (peripheral): Secondary | ICD-10-CM

## 2021-07-02 DIAGNOSIS — E1142 Type 2 diabetes mellitus with diabetic polyneuropathy: Secondary | ICD-10-CM

## 2021-07-02 NOTE — Progress Notes (Signed)
?Subjective:  ?Patient ID: Kayla [name], female    DOB: 1940/10/23,   MRN: [id number] ? ?Chief Complaint  ?Patient presents with  ? Nail Problem  ?   ? Patient right great toe  nail is black , she is concern that toenail stating she think it may get infected she is also concern about the cellulitis in her right leg.  ? ? ?81 y.o. female presents for concern of right great toenail turning black and blue. 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 Relates this is how the other toe started and is concerned. Denies any injury.  Also concerned for swelling and cellulitis in right leg.  . Denies any other pedal complaints. Denies n/v/f/c.  ? ?Past Medical History:  ?Diagnosis Date  ? Allergy   ? Anxiety   ? Arthritis   ? CAD (coronary artery disease)   ? Mild per remote cath in 2006, /    Nuclear, January, 2013, no significant abnormality,  ? Carotid artery disease ([place])   ? Doppler, January, 2013, 0 98% RIC A., 92-11% LICA, stable  ? Cataract   ? CHF (congestive heart failure) ([place])   ? Chronic kidney disease   ? Complication of anesthesia   ? wakes up "mean"  ? COPD 02/16/2007  ? Intolerant of Spiriva, tudorza Intolerant of Trelegy   ? COPD (chronic obstructive pulmonary disease) ([place])   ? Coronary atherosclerosis 11/24/2008  ? Catheterization 2006, nonobstructive coronary disease   //   nuclear 2013, low risk    ? Diabetes mellitus, type 2 ([place])   ? Diverticula of colon   ? DIVERTICULOSIS OF COLON 03/23/2007  ? Qualifier: Diagnosis of  By: Halford Chessman MD, Vineet    ? Dizziness   ? occasional  ? Dizzy spells 05/04/2011  ? DM (diabetes mellitus) ([place]) 03/23/2007  ? Qualifier: Diagnosis of  By: Halford Chessman MD, Vineet    ? Dyspnea   ? On exertion  ? Ejection fraction   ? Essential hypertension 02/16/2007  ? Qualifier: Diagnosis of  By: Rosana Hoes CMA, Tammy    ? G E R D 03/23/2007  ? Qualifier: Diagnosis of  By: Halford Chessman MD, Vineet    ? Gall bladder disease 04/28/2016  ? Gastritis and  gastroduodenitis   ? GERD (gastroesophageal reflux disease)   ? Goiter   ? hx of  ? Headache(784.0)   ? migraine  ? Hyperlipidemia   ? Hypertension   ? Hypothyroidism   ? HYPOTHYROIDISM, POSTSURGICAL 06/04/2008  ? Qualifier: Diagnosis of  By: Loanne Drilling MD, Jacelyn Pi   ? Left ventricular hypertrophy   ? Leg edema   ? occasional swelling  ? Leg swelling 12/09/2018  ? Myofascial pain syndrome, cervical 01/06/2018  ? Obesity   ? OBESITY 11/24/2008  ? Qualifier: Diagnosis of  By: Sidney Ace    ? OBSTRUCTIVE SLEEP APNEA 02/16/2007  ? Auto CPAP 09/03/13 to 10/02/13 >> used on 30 of 30 nights with average 6 hrs and 3 min.  Average AHI is 3.1 with median CPAP 5 cm H2O and 95 th percentile CPAP 6 cm H2O.    ? Occipital neuralgia of right side 01/06/2018  ? OSA (obstructive sleep apnea)   ? cpap setting of 12  ? Pain in joint of right hip 01/21/2018  ? Palpitations 01/21/2011  ? 48 hour Holter, 2015, scattered PACs and PVCs.   ? Paroxysmal atrial fibrillation (HCC)   ? Pneumonia   ? PONV (postoperative nausea  and vomiting)   ? severe after every surgery  ? Pulmonary hypertension, secondary   ? RHINITIS 07/21/2010  ?     ? RUQ abdominal pain   ? S/P left TKA 07/27/2011  ? Sinus node dysfunction ([place]) 11/08/2015  ? Sleep apnea   ? Spinal stenosis of cervical region 11/07/2014  ? Urinary frequency 12/09/2018  ? UTI (urinary tract infection) 08/15/2019  ? per pt report  ? VENTRICULAR HYPERTROPHY, LEFT 11/24/2008  ? Qualifier: Diagnosis of  By: Sidney Ace    ? ? ?Objective:  ?Physical Exam: ?Vascular: DP/PT pulses 2/4 bilateral. CFT <3 seconds. Normal hair growth on digits. Edema and hemosidering deposit noted to bilateral lower extremities worse on right. Some small areas of blistering and openings.  ?Skin. No lacerations or abrasions bilateral feet. Right hallux nail proximal nail plat with hemosiderin deposit underlying nail. No tenderness to palpation. Nails 1-5 on right and 2-5 on right thickened elongated with subungual  debris.  ?Musculoskeletal: MMT 5/5 bilateral lower extremities in DF, PF, Inversion and Eversion. Deceased ROM in DF of ankle joint.  ?Neurological: Sensation intact to light touch.  ? ?Assessment:  ? ?1. Diabetic peripheral neuropathy associated with type 2 diabetes mellitus ([place])   ?2. Pain due to onychomycosis of toenail   ?3. Venous (peripheral) insufficiency   ? ? ? ?Plan:  ?Patient was evaluated and treated and all questions answered. ?-Discussed and educated patient on diabetic foot care, especially with  ?regards to the vascular, neurological and musculoskeletal systems.  ?-Stressed the importance of good glycemic control and the detriment of not  ?controlling glucose levels in relation to the foot. ?-Discussed supportive shoes at all times and checking feet regularly.  ?-Mechanically debrided all nails 1-5 bilateral using sterile nail nipper and filed with dremel without incident  ?-Will keep an eye on this toe area for any worsening discoloration or pain.  ?-Discussed compression stocking for leg swelling.  ?-Answered all patient questions ?-Patient to return  in 3 months for at risk foot care ?-Patient advised to call the office if any problems or questions arise in the meantime. ? ? ?Lorenda Peck, DPM  ? ? ?

## 2021-07-02 NOTE — Telephone Encounter (Signed)
? ?  1. Has your device fired?  ? ?2. Is you device beeping?  ? ?3. Are you experiencing draining or swelling at device site?  ? ?4. Are you calling to see if we received your device transmission? Pt said she is having hard time to send her transmission. She needs help ? ?5. Have you passed out?  ? ? ? ?Please route to Device Clinic Pool  ?

## 2021-07-02 NOTE — Telephone Encounter (Signed)
The patient is getting a new handheld in 7-10 business days.  ?

## 2021-07-07 ENCOUNTER — Encounter: Payer: Self-pay | Admitting: Cardiology

## 2021-07-07 ENCOUNTER — Ambulatory Visit: Payer: Medicare HMO | Admitting: Cardiology

## 2021-07-07 ENCOUNTER — Other Ambulatory Visit: Payer: Self-pay

## 2021-07-07 DIAGNOSIS — I5032 Chronic diastolic (congestive) heart failure: Secondary | ICD-10-CM

## 2021-07-07 DIAGNOSIS — N1831 Chronic kidney disease, stage 3a: Secondary | ICD-10-CM | POA: Diagnosis not present

## 2021-07-07 DIAGNOSIS — Z950 Presence of cardiac pacemaker: Secondary | ICD-10-CM

## 2021-07-07 DIAGNOSIS — R0989 Other specified symptoms and signs involving the circulatory and respiratory systems: Secondary | ICD-10-CM

## 2021-07-07 DIAGNOSIS — D6869 Other thrombophilia: Secondary | ICD-10-CM

## 2021-07-07 DIAGNOSIS — I480 Paroxysmal atrial fibrillation: Secondary | ICD-10-CM

## 2021-07-07 NOTE — Assessment & Plan Note (Signed)
Ordering carotid doppler. Prior mild 2017 ?

## 2021-07-07 NOTE — Progress Notes (Signed)
Cardiology Office Note:    Date:  07/07/2021   ID:  Kayla [name], DOB 09/02/1940, MRN [id number]  PCP:  [name], MD   [facility] Providers Cardiologist:  Candee Furbish, MD Electrophysiologist:  Virl Axe, MD     Referring MD: [name], MD    History of Present Illness:    Kayla [name] is a 81 y.o. female here for the follow-up of chronic diastolic heart failure, [employer] pacemaker moderate CAD, Dr. Caryl Comes follows in EP, paroxysmal atrial fibrillation on chronic amiodarone, previously on dofetilide, MRI is pacemaker compatible.  Has had bouts of bursitis, cellulitis has been in the hospital last in November 2022.  Has many complaints including shortness of breath fatigue.  Echo EF 65%.  No bleeding episodes.  Frustrated by her chronic right lower extremity cellulitis.  She has had 3 rounds of antibiotics she states.  Dr. Alvan Dame has been helping as well.  Overall today she is doing fairly well.  Utilizing walker.  Outside office lab work reviewed.  LDL 35 A1c 8.5 creatinine 1.8 ALT 16 TSH 0.08 hemoglobin 11.2.  She is on amiodarone.  Past Medical History:  Diagnosis Date   Allergy    Anxiety    Arthritis    CAD (coronary artery disease)    Mild per remote cath in 2006, /    Nuclear, January, 2013, no significant abnormality,   Carotid artery disease ([place])    Doppler, January, 2013, 0 44% RIC A., 81-85% LICA, stable   Cataract    CHF (congestive heart failure) ([place])    Chronic kidney disease    Complication of anesthesia    wakes up "mean"   COPD 02/16/2007   Intolerant of Spiriva, tudorza Intolerant of Trelegy    COPD (chronic obstructive pulmonary disease) ([place])    Coronary atherosclerosis 11/24/2008   Catheterization 2006, nonobstructive coronary disease   //   nuclear 2013, low risk     Diabetes mellitus, type 2 ([place])    Diverticula of colon    DIVERTICULOSIS OF COLON 03/23/2007   Qualifier: Diagnosis of  By: Halford Chessman MD, Vineet     Dizziness     occasional   Dizzy spells 05/04/2011   DM (diabetes mellitus) ([place]) 03/23/2007   Qualifier: Diagnosis of  By: Halford Chessman MD, Vineet     Dyspnea    On exertion   Ejection fraction    Essential hypertension 02/16/2007   Qualifier: Diagnosis of  By: Rosana Hoes CMA, Tammy     G E R D 03/23/2007   Qualifier: Diagnosis of  By: Halford Chessman MD, Vineet     Gall bladder disease 04/28/2016   Gastritis and gastroduodenitis    GERD (gastroesophageal reflux disease)    Goiter    hx of   Headache(784.0)    migraine   Hyperlipidemia    Hypertension    Hypothyroidism    HYPOTHYROIDISM, POSTSURGICAL 06/04/2008   Qualifier: Diagnosis of  By: Loanne Drilling MD, Sean A    Left ventricular hypertrophy    Leg edema    occasional swelling   Leg swelling 12/09/2018   Myofascial pain syndrome, cervical 01/06/2018   Obesity    OBESITY 11/24/2008   Qualifier: Diagnosis of  By: Sidney Ace     OBSTRUCTIVE SLEEP APNEA 02/16/2007   Auto CPAP 09/03/13 to 10/02/13 >> used on 30 of 30 nights with average 6 hrs and 3 min.  Average AHI is 3.1 with median CPAP 5 cm H2O and 95 th percentile CPAP 6  cm H2O.     Occipital neuralgia of right side 01/06/2018   OSA (obstructive sleep apnea)    cpap setting of 12   Pain in joint of right hip 01/21/2018   Palpitations 01/21/2011   48 hour Holter, 2015, scattered PACs and PVCs.    Paroxysmal atrial fibrillation (HCC)    Pneumonia    PONV (postoperative nausea and vomiting)    severe after every surgery   Pulmonary hypertension, secondary    RHINITIS 07/21/2010        RUQ abdominal pain    S/P left TKA 07/27/2011   Sinus node dysfunction (HCC) 11/08/2015   Sleep apnea    Spinal stenosis of cervical region 11/07/2014   Urinary frequency 12/09/2018   UTI (urinary tract infection) 08/15/2019   per pt report   VENTRICULAR HYPERTROPHY, LEFT 11/24/2008   Qualifier: Diagnosis of  By: Sidney Ace      Past Surgical History:  Procedure Laterality Date   ABDOMINAL HYSTERECTOMY      with appendectomy and colon polypectomy   AMPUTATION TOE Left 12/15/2019   Procedure: AMPUTATION  LEFT GREAT TOE;  Surgeon: Evelina Bucy, DPM;  Location: [facility];  Service: Podiatry;  Laterality: Left;   ANTERIOR CERVICAL DECOMP/DISCECTOMY FUSION N/A 11/07/2014   Procedure: Anterior Cervical diskectomy and fusion Cervical four-five, Cervical five-six, Cervical six-seven;  Surgeon: Kary Kos, MD;  Location: [place] [facility];  Service: Neurosurgery;  Laterality: N/A;   APPENDECTOMY     ARTERY BIOPSY Right 02/16/2017   Procedure: BIOPSY TEMPORAL ARTERY;  Surgeon: Katha Cabal, MD;  Location: [facility] ORS;  Service: Vascular;  Laterality: Right;   ARTERY BIOPSY Left 04/16/2020   Procedure: BIOPSY TEMPORAL ARTERY;  Surgeon: Katha Cabal, MD;  Location: [facility] ORS;  Service: Vascular;  Laterality: Left;  LEFT TEMPLE   BACK SURGERY     CARDIAC CATHETERIZATION     CATARACT EXTRACTION W/ INTRAOCULAR LENS  IMPLANT, BILATERAL Bilateral    CHOLECYSTECTOMY N/A 04/29/2016   Procedure: LAPAROSCOPIC CHOLECYSTECTOMY WITH INTRAOPERATIVE CHOLANGIOGRAM;  Surgeon: Alphonsa Overall, MD;  Location: [facility];  Service: General;  Laterality: N/A;   EP IMPLANTABLE DEVICE N/A 11/08/2015   MRI COMPATABLE -- Procedure: Pacemaker Implant;  Surgeon: Deboraha Sprang, MD;  Location: [place] CV LAB;  Service: Cardiovascular;  Laterality: N/A;   INCISION AND DRAINAGE ABSCESS Right 05/30/2021   Procedure: IRRIGATION AND DEBRIDEMENT OF PREPATELLA BURSA;  Surgeon: Paralee Cancel, MD;  Location: [facility];  Service: Orthopedics;  Laterality: Right;  60 wants standard knee draping and pulse lavage   INSERT / REPLACE / REMOVE PACEMAKER  2017   Dr. Jens Som [facility])   JOINT REPLACEMENT     LUMBAR [place] SURGERY  1990's   x 2   THYROIDECTOMY  2011   TOTAL KNEE ARTHROPLASTY  07/27/2011   Procedure: TOTAL KNEE ARTHROPLASTY;  Surgeon: Mauri Pole, MD;  Location: [facility];  Service: Orthopedics;  Laterality: Left;   UPPER ESOPHAGEAL ENDOSCOPIC  ULTRASOUND (EUS) N/A 05/14/2016   Procedure: UPPER ESOPHAGEAL ENDOSCOPIC ULTRASOUND (EUS);  Surgeon: Milus Banister, MD;  Location: [facility];  Service: Endoscopy;  Laterality: N/A;  radial only-will be done mod if needed    Current Medications: Current Meds  Medication Sig   acetaminophen (TYLENOL) 500 MG tablet Take 1,000 mg by mouth every 6 (six) hours as needed for moderate pain (for headaches).    albuterol (PROVENTIL) (2.5 MG/3ML) 0.083% nebulizer solution Take 3 mLs (2.5 mg total) by nebulization every 2 (two) hours  as needed for wheezing or shortness of breath. (Patient taking differently: Take 2.5 mg by nebulization in the morning, at noon, and at bedtime.)   albuterol (VENTOLIN HFA) 108 (90 Base) MCG/ACT inhaler Inhale 2 puffs into the lungs every 6 (six) hours as needed for wheezing or shortness of breath.   Alcohol Swabs (B-D SINGLE USE SWABS REGULAR) PADS    allopurinol (ZYLOPRIM) 100 MG tablet Take 100 mg by mouth 2 (two) times daily.   amiodarone (PACERONE) 200 MG tablet Take 1 tablet (200 mg total) by mouth daily.   amLODipine (NORVASC) 5 MG tablet Take 1 tablet (5 mg total) by mouth daily.   apixaban (ELIQUIS) 2.5 MG TABS tablet Take 1 tablet (2.5 mg total) by mouth 2 (two) times daily.   ascorbic acid (VITAMIN C) 500 MG tablet Take 500 mg by mouth every morning.   atorvastatin (LIPITOR) 40 MG tablet TAKE 1 TABLET EVERY DAY (Patient taking differently: Take 40 mg by mouth daily.)   benzonatate (TESSALON) 200 MG capsule Take 200 mg by mouth 3 (three) times daily as needed for cough.   Blood Glucose Calibration (TRUE METRIX LEVEL 1) Low SOLN    BREZTRI AEROSPHERE 160-9-4.8 MCG/ACT AERO INHALE 2 PUFFS INTO THE LUNGS IN THE MORNING AND AT BEDTIME.   Carboxymethylcellulose Sodium (ARTIFICIAL TEARS OP) Place 1 drop into both eyes daily as needed ([place] damage).   Dextromethorphan-guaiFENesin (TUSSIN DM MAX SUGAR-FREE PO) Take 5 mLs by mouth every 4 (four) hours as needed (for  coughing).   ferrous sulfate 325 (65 FE) MG tablet Take 1 tablet (325 mg total) by mouth daily with breakfast.   fluticasone (FLONASE) 50 MCG/ACT nasal spray USE 2 SPRAYS NASALLY DAILY (Patient taking differently: Place 2 sprays into both nostrils daily as needed for allergies or rhinitis.)   gabapentin (NEURONTIN) 800 MG tablet Take 800 mg by mouth in the morning, at noon, in the evening, and at bedtime.   HYDROcodone-acetaminophen (NORCO/VICODIN) 5-325 MG tablet Take 1 tablet by mouth every 6 (six) hours as needed for severe pain.   insulin aspart (NOVOLOG) 100 UNIT/ML FlexPen Inject 5-15 Units into the skin 3 (three) times daily with meals. Per sliding scale (and an additional 1 unit for every 50 points for a BGL >130) Sliding scale   insulin glargine (LANTUS) 100 UNIT/ML injection Inject 110 Units into the skin in the morning and at bedtime. Unless if she has a sugar low then will take less   levothyroxine (SYNTHROID) 112 MCG tablet Take 224 mcg by mouth daily.   losartan (COZAAR) 50 MG tablet Take 25 mg by mouth daily.   metoprolol succinate (TOPROL-XL) 100 MG 24 hr tablet TAKE 1 TABLET EVERY DAY WITH OR IMMEDIATELY AFTER A MEAL (Patient taking differently: Take 100 mg by mouth daily.)   naloxone (NARCAN) nasal spray 4 mg/0.1 mL Place 4 mg into the nose once as needed for opioid reversal.   nitroGLYCERIN (NITROSTAT) 0.4 MG SL tablet Place 1 tablet (0.4 mg total) under the tongue every 5 (five) minutes as needed for chest pain.   polyethylene glycol (MIRALAX / GLYCOLAX) 17 g packet Take 17 g by mouth daily as needed for mild constipation.   PRESCRIPTION MEDICATION 1 Dose by Other route at bedtime. CPAP: At bedtime and "as needed for shortness of breath"   Sennosides (LAXATIVE) 25 MG TABS Take 25 mg by mouth daily as needed (Constipation). [employer] brand   torsemide (DEMADEX) 20 MG tablet Take 80 mg by mouth daily.  TRUE METRIX BLOOD GLUCOSE TEST test strip    TRUEplus Lancets 28G MISC     vitamin B-12 (CYANOCOBALAMIN) 500 MCG tablet Take 500 mcg by mouth daily.   Vitamin D3 (VITAMIN D) 25 MCG tablet Take 1,000 Units by mouth every morning.     Allergies:   Dulaglutide, Oxycodone, and Trelegy ellipta [fluticasone-umeclidin-vilant]   Social History   Socioeconomic History   Marital status: Married    Spouse name: Ossie Yebra   Number of children: 3   Years of education: 12   Highest education level: Not on file  Occupational History   Occupation: retired  Tobacco Use   Smoking status: Former    Packs/day: 2.00    Years: 33.00    Pack years: 66.00    Types: Cigarettes    Start date: 1966    Quit date: 05/04/1997    Years since quitting: 24.1   Smokeless tobacco: Never  Vaping Use   Vaping Use: Never used  Substance and Sexual Activity   Alcohol use: No   Drug use: No   Sexual activity: Not on file  Other Topics Concern   Not on file  Social History Narrative   Patient lives at home with her husband Jeneen Rinks .   Retired.   Education 12 th grade.   Right handed   Caffeine two cups of coffee.   Social Determinants of Health   Financial Resource Strain: Low Risk    Difficulty of Paying Living Expenses: Not very hard  Food Insecurity: No Food Insecurity   Worried About Charity fundraiser in the Last Year: Never true   Ran Out of Food in the Last Year: Never true  Transportation Needs: No Transportation Needs   Lack of Transportation (Medical): No   Lack of Transportation (Non-Medical): No  Physical Activity: Not on file  Stress: Not on file  Social Connections: Not on file     Family History: The patient's family history includes Diabetes in her maternal aunt and maternal uncle; Heart Problems in her father and mother; Heart defect in her mother; Heart disease in her brother; Lung cancer in her daughter; Throat cancer in her brother. There is no history of Colon cancer, Colon polyps, Esophageal cancer, Rectal cancer, or Stomach cancer.  ROS:   Please  see the history of present illness.     All other systems reviewed and are negative.  EKGs/Labs/Other Studies Reviewed:    The following studies were reviewed today: TTE November 01, 2020 IMPRESSIONS     1. Left ventricular ejection fraction, by estimation, is 60 to 65%. The  left ventricle has normal function. The left ventricle has no regional  wall motion abnormalities. There is severe concentric left ventricular  hypertrophy. Left ventricular diastolic   parameters are consistent with Grade II diastolic dysfunction  (pseudonormalization). The global longitudinal strain is normal (assessed  in the A4c view with no foreshortening).   2. Right ventricular systolic function is normal. The right ventricular  size is normal. There is normal pulmonary artery systolic pressure.   3. The mitral valve is grossly normal. No evidence of mitral valve  regurgitation.   4. The aortic valve is tricuspid. Aortic valve regurgitation is not  visualized. No aortic stenosis is present.   Comparison(s): A prior study was performed on 08/13/19. Prior images  reviewed side by side. LV is slightly less dynamic.   EKG:  EKG is  ordered today.  The ekg ordered today demonstrates sinus rhythm 81 bpm first-degree AV  block PR interval 300 ms  Recent Labs: 09/24/2020: Magnesium 2.0; NT-Pro BNP 562 01/15/2021: TSH 0.524 01/30/2021: ALT 15 03/20/2021: B Natriuretic Peptide 293.1 05/31/2021: BUN 48; Creatinine, Ser 1.53; Hemoglobin 10.2; Platelets 383; Potassium 5.5; Sodium 133  Recent Lipid Panel No results found for: CHOL, TRIG, HDL, CHOLHDL, VLDL, LDLCALC, LDLDIRECT   Risk Assessment/Calculations:    CHA2DS2-VASc Score =     This indicates a  % annual risk of stroke. The patient's score is based upon:                Physical Exam:    VS:  BP (!) 128/50 (BP Location: Left Arm, Patient Position: Sitting, Cuff Size: Large)    Pulse 81    Ht '5\' 5"'$  (1.651 m)    Wt 238 lb (108 kg)    SpO2 92%    BMI 39.61 kg/m      Wt Readings from Last 3 Encounters:  07/07/21 238 lb (108 kg)  06/09/21 232 lb (105.2 kg)  05/30/21 231 lb (104.8 kg)     GEN:  Well nourished, well developed in no acute distress HEENT: Normal NECK: No JVD; Bilat carotid bruits LYMPHATICS: No lymphadenopathy CARDIAC: RRR, no murmurs, no rubs, gallops RESPIRATORY:  Clear to auscultation without rales, wheezing or rhonchi  ABDOMEN: Soft, non-tender, non-distended MUSCULOSKELETAL: Chronic lower extremity edema; No deformity, chronic erythema SKIN: Warm and dry NEUROLOGIC:  Alert and oriented x 3 PSYCHIATRIC:  Normal affect      ASSESSMENT:    1. Chronic diastolic heart failure (HCC)   2. Paroxysmal atrial fibrillation ([place])   3. Pacemaker - MDT   4. Secondary hypercoagulable state ([place])   5. Stage 3a chronic kidney disease ([place])   6. Bilateral carotid bruits    PLAN:    In order of problems listed above:  Chronic diastolic heart failure ([place]) 03/2021 hospital stay. On torsemide 80 QD Metoprolol 100 QD Losartan 25 QD EF 65% Diet and salt discussed  Paroxysmal atrial fibrillation (HCC) Stable. Pacer for brady back up Eliquis, metoprolol AMIODARONE - high risk medication mgt. Monitoring LABS.   Pacemaker - MDT Dr. Caryl Comes. Paceart reviewed. Functioning well  Secondary hypercoagulable state ([place]) AFIB. Eliquis. Monitor CBC and BMET. High risk medication  CKD (chronic kidney disease), stage III (HCC) Creat 1.8. Sees Nepho.   Carotid bruit Ordering carotid doppler. Prior mild 2017         Medication Adjustments/Labs and Tests Ordered: Current medicines are reviewed at length with the patient today.  Concerns regarding medicines are outlined above.  Orders Placed This Encounter  Procedures   EKG 12-Lead   VAS US CAROTID   No orders of the defined types were placed in this encounter.   Patient Instructions  Medication Instructions:  The current medical regimen is effective;  continue present plan  and medications.  *If you need a refill on your cardiac medications before your next appointment, please call your pharmacy*  Testing/Procedures: Your physician has requested that you have a carotid duplex. This test is an ultrasound of the carotid arteries in your neck. It looks at blood flow through these arteries that supply the brain with blood. Allow one hour for this exam. There are no restrictions or special instructions.  Follow-Up: At [facility], you and your health needs are our priority.  As part of our continuing mission to provide you with exceptional heart care, we have created designated Provider Care Teams.  These Care Teams include your primary Cardiologist (physician) and Advanced  Practice Providers (APPs -  Physician Assistants and Nurse Practitioners) who all work together to provide you with the care you need, when you need it.  We recommend signing up for the patient portal called "MyChart".  Sign up information is provided on this After Visit Summary.  MyChart is used to connect with patients for Virtual Visits (Telemedicine).  Patients are able to view lab/test results, encounter notes, upcoming appointments, etc.  Non-urgent messages can be sent to your provider as well.   To learn more about what you can do with MyChart, go to [email or web address].    Your next appointment:   6 month(s)  The format for your next appointment:   In Person  Provider:   Nicholes Rough, PA-C, Christen Bame, NP, or Richardson Dopp, PA-C     Then, Candee Furbish, MD will plan to see you again in 1 year(s).{  Thank you for choosing [facility]!!      Signed, Candee Furbish, MD  07/07/2021 8:09 AM    [facility]

## 2021-07-07 NOTE — Assessment & Plan Note (Signed)
Dr. Caryl Comes. Paceart reviewed. Functioning well ?

## 2021-07-07 NOTE — Assessment & Plan Note (Signed)
Creat 1.8. Sees Nepho.  ?

## 2021-07-07 NOTE — Patient Instructions (Signed)
Medication Instructions:  ?The current medical regimen is effective;  continue present plan and medications. ? ?*If you need a refill on your cardiac medications before your next appointment, please call your pharmacy* ? ?Testing/Procedures: ?Your physician has requested that you have a carotid duplex. This test is an ultrasound of the carotid arteries in your neck. It looks at blood flow through these arteries that supply the brain with blood. Allow one hour for this exam. There are no restrictions or special instructions. ? ?Follow-Up: ?At [facility], you and your health needs are our priority.  As part of our continuing mission to provide you with exceptional heart care, we have created designated Provider Care Teams.  These Care Teams include your primary Cardiologist (physician) and Advanced Practice Providers (APPs -  Physician Assistants and Nurse Practitioners) who all work together to provide you with the care you need, when you need it. ? ?We recommend signing up for the patient portal called "MyChart".  Sign up information is provided on this After Visit Summary.  MyChart is used to connect with patients for Virtual Visits (Telemedicine).  Patients are able to view lab/test results, encounter notes, upcoming appointments, etc.  Non-urgent messages can be sent to your provider as well.   ?To learn more about what you can do with MyChart, go to [email or web address].   ? ?Your next appointment:   ?6 month(s) ? ?The format for your next appointment:   ?In Person ? ?Provider:   ?Nicholes Rough, PA-C, Christen Bame, NP, or Richardson Dopp, PA-C     Then, Candee Furbish, MD will plan to see you again in 1 year(s).{ ? ?Thank you for choosing [facility]!! ? ? ? ?

## 2021-07-07 NOTE — Assessment & Plan Note (Signed)
03/2021 hospital stay. On torsemide 80 QD ?Metoprolol 100 QD ?Losartan 25 QD ?EF 65% ?Diet and salt discussed ?

## 2021-07-07 NOTE — Assessment & Plan Note (Addendum)
Stable. Pacer for brady back up ?Eliquis, metoprolol ?AMIODARONE - high risk medication mgt. Monitoring LABS.  ?

## 2021-07-07 NOTE — Assessment & Plan Note (Signed)
AFIB. Eliquis. Monitor CBC and BMET. High risk medication ?

## 2021-07-08 ENCOUNTER — Ambulatory Visit (INDEPENDENT_AMBULATORY_CARE_PROVIDER_SITE_OTHER): Payer: Medicare HMO

## 2021-07-08 DIAGNOSIS — E113213 Type 2 diabetes mellitus with mild nonproliferative diabetic retinopathy with macular edema, bilateral: Secondary | ICD-10-CM | POA: Diagnosis not present

## 2021-07-08 DIAGNOSIS — I495 Sick sinus syndrome: Secondary | ICD-10-CM

## 2021-07-08 DIAGNOSIS — H43813 Vitreous degeneration, bilateral: Secondary | ICD-10-CM | POA: Diagnosis not present

## 2021-07-08 DIAGNOSIS — H353132 Nonexudative age-related macular degeneration, bilateral, intermediate dry stage: Secondary | ICD-10-CM | POA: Diagnosis not present

## 2021-07-08 LAB — CUP PACEART REMOTE DEVICE CHECK
Battery Remaining Longevity: 49 mo
Battery Voltage: 2.98 V
Brady Statistic AP VP Percent: 5.94 %
Brady Statistic AP VS Percent: 81.54 %
Brady Statistic AS VP Percent: 6.37 %
Brady Statistic AS VS Percent: 6.15 %
Brady Statistic RA Percent Paced: 86.31 %
Brady Statistic RV Percent Paced: 12.58 %
Date Time Interrogation Session: 20230305144837
Implantable Lead Implant Date: 20170707
Implantable Lead Implant Date: 20170707
Implantable Lead Location: 753859
Implantable Lead Location: 753860
Implantable Lead Model: 3830
Implantable Lead Model: 5076
Implantable Pulse Generator Implant Date: 20170707
Lead Channel Impedance Value: 285 Ohm
Lead Channel Impedance Value: 361 Ohm
Lead Channel Impedance Value: 380 Ohm
Lead Channel Impedance Value: 399 Ohm
Lead Channel Pacing Threshold Amplitude: 1.125 V
Lead Channel Pacing Threshold Amplitude: 1.875 V
Lead Channel Pacing Threshold Pulse Width: 0.4 ms
Lead Channel Pacing Threshold Pulse Width: 0.4 ms
Lead Channel Sensing Intrinsic Amplitude: 4.5 mV
Lead Channel Sensing Intrinsic Amplitude: 4.5 mV
Lead Channel Sensing Intrinsic Amplitude: 5.75 mV
Lead Channel Sensing Intrinsic Amplitude: 5.75 mV
Lead Channel Setting Pacing Amplitude: 2.25 V
Lead Channel Setting Pacing Amplitude: 2.5 V
Lead Channel Setting Pacing Pulse Width: 1 ms
Lead Channel Setting Sensing Sensitivity: 2.8 mV

## 2021-07-09 ENCOUNTER — Other Ambulatory Visit: Payer: Self-pay

## 2021-07-09 ENCOUNTER — Ambulatory Visit (HOSPITAL_COMMUNITY)
Admission: RE | Admit: 2021-07-09 | Discharge: 2021-07-09 | Disposition: A | Discharge status: Home / Self Care | Payer: Medicare HMO | Source: Ambulatory Visit | Attending: Cardiovascular Disease | Admitting: Cardiovascular Disease

## 2021-07-09 DIAGNOSIS — R0989 Other specified symptoms and signs involving the circulatory and respiratory systems: Secondary | ICD-10-CM | POA: Insufficient documentation

## 2021-07-16 DIAGNOSIS — M71161 Other infective bursitis, right knee: Secondary | ICD-10-CM | POA: Diagnosis not present

## 2021-07-16 DIAGNOSIS — M7062 Trochanteric bursitis, left hip: Secondary | ICD-10-CM | POA: Diagnosis not present

## 2021-07-16 DIAGNOSIS — M25561 Pain in right knee: Secondary | ICD-10-CM | POA: Diagnosis not present

## 2021-07-18 ENCOUNTER — Other Ambulatory Visit: Payer: Self-pay

## 2021-07-18 NOTE — Patient Outreach (Signed)
[place] [facility]) Care Management ? ?07/18/2021 ? ?Kayla [name] ?February 25, 1941 ?[id number] ? ? ?Telephone call to patient for follow up. Patient reports she is doing fair.  Continues to deal with knee swelling and pain.  Patient has follow up scheduled with orthopedist.  Patient breathing recently has not been good per patient. She reports increased shortness of breath with activity. We discussed COPD zones and notifying physician of any changes.  She verbalized understanding and states she will call if breathing gets worse.  Patient using inhalers and nebulizer's for her COPD.  Patient voices no concerns.  ? ?Care Plan : RN Care Manager Plan of Kayla [name]  ?Updates made by Kayla Billings, RN since 07/18/2021 12:00 AM  ?  ? ?Problem: Chronic Disease Management and Care Coordination for COPD   ?  ? ?Long-Range Goal: Development of Plan of Care for COPD   ?Start Date: 03/26/2021  ?Expected End Date: 05/03/2022  ?This Visit's Progress: On track  ?Recent Progress: On track  ?Priority: High  ?Note:   ?Current Barriers:  ?Knowledge Deficits related to plan of care for management of COPD  ?Chronic Disease Management support and education needs related to COPD ? ?RNCM Clinical Goal(s):  ?Patient will verbalize understanding of plan for management of COPD as evidenced by No exacerbation of COPD ?take all medications exactly as prescribed and will call provider for medication related questions as evidenced by patient reports of medication compliance    ?continue to work with Consulting civil engineer and/or Social Worker to address care management and care coordination needs related to COPD as evidenced by adherence to CM Team Scheduled appointments     through collaboration with Consulting civil engineer, provider, and care team.  ? ?Interventions: ?Education provided to patient on COPD ?Inter-disciplinary care team collaboration (see longitudinal plan of care) ?Evaluation of current treatment plan related to  self management and patient's  adherence to plan as established by provider ?Encouraged patient to pace self with activity. ?Reviewed importance of medication compliance. ?Reviewed when to call for physician for increased shortness of breath, increased sputum, fatigue that is not his normal, cough, fever or sick contacts.  ? ?05/19/21-Patient reports breathing has been at baseline and no new issues or concerns related to that. She continues to adhere to resp tx mgmt and using nebs and inhaler as ordered. ? ?07/18/21 Patient reports some increase in shortness of breath and that she is using her inhaler and nebulizers.  Patient states she will be contacting her pulmonologist if breathing issues continue.   ? ? ?Patient Goals/Self-Care Activities: COPD ?Take medications as prescribed   ?Call provider office for new concerns or questions  ?- follow rescue plan if symptoms flare-up ?- use devices that will help like a cane, sock-puller or reacher ? ? ?Follow Up Plan:  Telephone follow up appointment with care management team member scheduled for:  within the month of April ?The patient has been provided with contact information for the care management team and has been advised to call with any health related questions or concerns.  ? ? ? ?  ? ?Long-Range Goal: Development of POC for Mgmt of Chronic Condition-Chronic Pain   ?Start Date: 05/19/2021  ?Expected End Date: 05/19/2022  ?This Visit's Progress: On track  ?Priority: High  ?Note:   ?Current Barriers:  ?Knowledge Deficits related to plan of care for management of chronic pain  ? ?RNCM Clinical Goal(s):  ?Patient will verbalize understanding of plan for management of chronic pain as evidenced by  reporting acceptable level of pain ?take all medications exactly as prescribed and will call provider for medication related questions as evidenced by med adherence/compliance ?continue to work with RN Care Manager to address care management and care coordination needs related to  chronic pain as evidenced by  adherence to CM Team Scheduled appointments through collaboration with RN Care manager, provider, and care team.  ? ?Interventions: ?POC sent to PCP upon initial assessment, quarterly and with any changes in patient's conditions ?Inter-disciplinary care team collaboration (see longitudinal plan of care) ?Evaluation of current treatment plan related to  self management and patient's adherence to plan as established by provider ? ? ?Pain Interventions:  (Status:  Goal on track:  Yes.) Long Term Goal ?Pain assessment performed ?Medications reviewed ?Reviewed provider established plan for pain management ?Discussed importance of adherence to all scheduled medical appointments ?Counseled on the importance of reporting any/all new or changed pain symptoms or management strategies to pain management provider ?Advised patient to report to care team affect of pain on daily activities ?Reviewed with patient prescribed pharmacological and nonpharmacological pain relief strategies ? ?05/19/2021-Patient reports she has been having ongoing right hip/back pain which appears to be worsening. She saw MD last week and been referred to a specialist. Patient taking Tylenol prn for pain. She has stronger pain med but does not like to take it. She reports her mobility has been limited due to pain. Spouse assisting her in the home.  ? ?07/18/21 Patient reports she has had some fluid drawn off of the knee. However, fluid is back and she has upcoming appointment with specialist for follow up.  Patient using walker for ambulation and taking tylenol for pain.   ? ? ?Patient Goals/Self-Care Activities: Knee pain ?Take all medications as prescribed ?Attend all scheduled provider appointments ?Call provider office for new concerns or questions  ? ?Follow Up Plan:  Telephone follow up appointment with care management team member scheduled for:  within the month of April ?The patient has been provided with contact information for the care management  team and has been advised to call with any health related questions or concerns.   ?  ? ?Plan: Follow-up: Patient agrees to Care Plan and Follow-up. ?Follow-up in the month of April.   ? ?[name], RN, MSN ?[facility] Care Management ?Care Management Coordinator ?Direct Line [phone] ?Toll Free: [phone]  ?Fax: [phone] ? ?

## 2021-07-18 NOTE — Patient Instructions (Addendum)
Patient Goals/Self-Care Activities: COPD ?Take medications as prescribed   ?Call provider office for new concerns or questions  ?- follow rescue plan if symptoms flare-up ?- use devices that will help like a cane, sock-puller or reacher ? ?Patient Goals/Self-Care Activities: Knee pain ?Take all medications as prescribed ?Attend all scheduled provider appointments ?Call provider office for new concerns or questions  ?

## 2021-07-21 ENCOUNTER — Emergency Department (HOSPITAL_COMMUNITY): Payer: Medicare HMO

## 2021-07-21 ENCOUNTER — Inpatient Hospital Stay (HOSPITAL_COMMUNITY)
Admission: EM | Admit: 2021-07-21 | Discharge: 2021-07-27 | DRG: 485 | Disposition: A | Discharge status: Home Health | Payer: Medicare HMO | Attending: Internal Medicine | Admitting: Internal Medicine

## 2021-07-21 ENCOUNTER — Other Ambulatory Visit: Payer: Self-pay

## 2021-07-21 ENCOUNTER — Encounter (HOSPITAL_COMMUNITY): Payer: Self-pay

## 2021-07-21 DIAGNOSIS — M1711 Unilateral primary osteoarthritis, right knee: Secondary | ICD-10-CM | POA: Diagnosis present

## 2021-07-21 DIAGNOSIS — Z794 Long term (current) use of insulin: Secondary | ICD-10-CM

## 2021-07-21 DIAGNOSIS — E785 Hyperlipidemia, unspecified: Secondary | ICD-10-CM | POA: Diagnosis present

## 2021-07-21 DIAGNOSIS — N1832 Chronic kidney disease, stage 3b: Secondary | ICD-10-CM | POA: Diagnosis present

## 2021-07-21 DIAGNOSIS — R410 Disorientation, unspecified: Secondary | ICD-10-CM | POA: Diagnosis not present

## 2021-07-21 DIAGNOSIS — E11649 Type 2 diabetes mellitus with hypoglycemia without coma: Secondary | ICD-10-CM | POA: Diagnosis present

## 2021-07-21 DIAGNOSIS — M1710 Unilateral primary osteoarthritis, unspecified knee: Secondary | ICD-10-CM | POA: Diagnosis not present

## 2021-07-21 DIAGNOSIS — B9562 Methicillin resistant Staphylococcus aureus infection as the cause of diseases classified elsewhere: Secondary | ICD-10-CM | POA: Diagnosis present

## 2021-07-21 DIAGNOSIS — Z96652 Presence of left artificial knee joint: Secondary | ICD-10-CM | POA: Diagnosis present

## 2021-07-21 DIAGNOSIS — E669 Obesity, unspecified: Secondary | ICD-10-CM | POA: Diagnosis present

## 2021-07-21 DIAGNOSIS — M71169 Other infective bursitis, unspecified knee: Secondary | ICD-10-CM | POA: Diagnosis not present

## 2021-07-21 DIAGNOSIS — Z7901 Long term (current) use of anticoagulants: Secondary | ICD-10-CM

## 2021-07-21 DIAGNOSIS — M549 Dorsalgia, unspecified: Secondary | ICD-10-CM | POA: Diagnosis not present

## 2021-07-21 DIAGNOSIS — M7110 Other infective bursitis, unspecified site: Principal | ICD-10-CM

## 2021-07-21 DIAGNOSIS — Z90710 Acquired absence of both cervix and uterus: Secondary | ICD-10-CM

## 2021-07-21 DIAGNOSIS — L03115 Cellulitis of right lower limb: Secondary | ICD-10-CM | POA: Diagnosis not present

## 2021-07-21 DIAGNOSIS — I130 Hypertensive heart and chronic kidney disease with heart failure and stage 1 through stage 4 chronic kidney disease, or unspecified chronic kidney disease: Secondary | ICD-10-CM | POA: Diagnosis not present

## 2021-07-21 DIAGNOSIS — N170 Acute kidney failure with tubular necrosis: Secondary | ICD-10-CM | POA: Diagnosis not present

## 2021-07-21 DIAGNOSIS — T380X5A Adverse effect of glucocorticoids and synthetic analogues, initial encounter: Secondary | ICD-10-CM | POA: Diagnosis not present

## 2021-07-21 DIAGNOSIS — J449 Chronic obstructive pulmonary disease, unspecified: Secondary | ICD-10-CM | POA: Diagnosis present

## 2021-07-21 DIAGNOSIS — Z66 Do not resuscitate: Secondary | ICD-10-CM | POA: Diagnosis present

## 2021-07-21 DIAGNOSIS — M71161 Other infective bursitis, right knee: Principal | ICD-10-CM | POA: Diagnosis present

## 2021-07-21 DIAGNOSIS — G894 Chronic pain syndrome: Secondary | ICD-10-CM | POA: Diagnosis not present

## 2021-07-21 DIAGNOSIS — G4733 Obstructive sleep apnea (adult) (pediatric): Secondary | ICD-10-CM | POA: Diagnosis present

## 2021-07-21 DIAGNOSIS — Z6838 Body mass index (BMI) 38.0-38.9, adult: Secondary | ICD-10-CM

## 2021-07-21 DIAGNOSIS — J811 Chronic pulmonary edema: Secondary | ICD-10-CM | POA: Diagnosis not present

## 2021-07-21 DIAGNOSIS — I2510 Atherosclerotic heart disease of native coronary artery without angina pectoris: Secondary | ICD-10-CM | POA: Diagnosis present

## 2021-07-21 DIAGNOSIS — Z885 Allergy status to narcotic agent status: Secondary | ICD-10-CM

## 2021-07-21 DIAGNOSIS — Z961 Presence of intraocular lens: Secondary | ICD-10-CM | POA: Diagnosis present

## 2021-07-21 DIAGNOSIS — M7041 Prepatellar bursitis, right knee: Secondary | ICD-10-CM | POA: Diagnosis not present

## 2021-07-21 DIAGNOSIS — I5032 Chronic diastolic (congestive) heart failure: Secondary | ICD-10-CM | POA: Diagnosis not present

## 2021-07-21 DIAGNOSIS — R918 Other nonspecific abnormal finding of lung field: Secondary | ICD-10-CM | POA: Diagnosis not present

## 2021-07-21 DIAGNOSIS — M7989 Other specified soft tissue disorders: Secondary | ICD-10-CM | POA: Diagnosis not present

## 2021-07-21 DIAGNOSIS — E349 Endocrine disorder, unspecified: Secondary | ICD-10-CM | POA: Diagnosis not present

## 2021-07-21 DIAGNOSIS — Z9989 Dependence on other enabling machines and devices: Secondary | ICD-10-CM | POA: Diagnosis not present

## 2021-07-21 DIAGNOSIS — E1159 Type 2 diabetes mellitus with other circulatory complications: Secondary | ICD-10-CM | POA: Diagnosis present

## 2021-07-21 DIAGNOSIS — Z89422 Acquired absence of other left toe(s): Secondary | ICD-10-CM

## 2021-07-21 DIAGNOSIS — I152 Hypertension secondary to endocrine disorders: Secondary | ICD-10-CM | POA: Diagnosis present

## 2021-07-21 DIAGNOSIS — K219 Gastro-esophageal reflux disease without esophagitis: Secondary | ICD-10-CM | POA: Diagnosis present

## 2021-07-21 DIAGNOSIS — I480 Paroxysmal atrial fibrillation: Secondary | ICD-10-CM | POA: Diagnosis not present

## 2021-07-21 DIAGNOSIS — E1122 Type 2 diabetes mellitus with diabetic chronic kidney disease: Secondary | ICD-10-CM | POA: Diagnosis present

## 2021-07-21 DIAGNOSIS — I10 Essential (primary) hypertension: Secondary | ICD-10-CM | POA: Diagnosis present

## 2021-07-21 DIAGNOSIS — Z833 Family history of diabetes mellitus: Secondary | ICD-10-CM

## 2021-07-21 DIAGNOSIS — J310 Chronic rhinitis: Secondary | ICD-10-CM | POA: Diagnosis present

## 2021-07-21 DIAGNOSIS — E1169 Type 2 diabetes mellitus with other specified complication: Secondary | ICD-10-CM | POA: Diagnosis present

## 2021-07-21 DIAGNOSIS — D649 Anemia, unspecified: Secondary | ICD-10-CM | POA: Diagnosis not present

## 2021-07-21 DIAGNOSIS — Z79899 Other long term (current) drug therapy: Secondary | ICD-10-CM

## 2021-07-21 DIAGNOSIS — E119 Type 2 diabetes mellitus without complications: Secondary | ICD-10-CM

## 2021-07-21 DIAGNOSIS — I272 Pulmonary hypertension, unspecified: Secondary | ICD-10-CM | POA: Diagnosis present

## 2021-07-21 DIAGNOSIS — E1165 Type 2 diabetes mellitus with hyperglycemia: Secondary | ICD-10-CM | POA: Diagnosis not present

## 2021-07-21 DIAGNOSIS — R079 Chest pain, unspecified: Secondary | ICD-10-CM | POA: Diagnosis not present

## 2021-07-21 DIAGNOSIS — Z981 Arthrodesis status: Secondary | ICD-10-CM

## 2021-07-21 DIAGNOSIS — Z1389 Encounter for screening for other disorder: Secondary | ICD-10-CM | POA: Diagnosis not present

## 2021-07-21 DIAGNOSIS — Z22322 Carrier or suspected carrier of Methicillin resistant Staphylococcus aureus: Secondary | ICD-10-CM

## 2021-07-21 DIAGNOSIS — Z7951 Long term (current) use of inhaled steroids: Secondary | ICD-10-CM

## 2021-07-21 DIAGNOSIS — E890 Postprocedural hypothyroidism: Secondary | ICD-10-CM | POA: Diagnosis not present

## 2021-07-21 DIAGNOSIS — Z79891 Long term (current) use of opiate analgesic: Secondary | ICD-10-CM | POA: Diagnosis not present

## 2021-07-21 DIAGNOSIS — M353 Polymyalgia rheumatica: Secondary | ICD-10-CM | POA: Diagnosis not present

## 2021-07-21 DIAGNOSIS — Z950 Presence of cardiac pacemaker: Secondary | ICD-10-CM | POA: Diagnosis present

## 2021-07-21 DIAGNOSIS — D638 Anemia in other chronic diseases classified elsewhere: Secondary | ICD-10-CM | POA: Diagnosis not present

## 2021-07-21 DIAGNOSIS — Z87891 Personal history of nicotine dependence: Secondary | ICD-10-CM

## 2021-07-21 DIAGNOSIS — E039 Hypothyroidism, unspecified: Secondary | ICD-10-CM | POA: Diagnosis not present

## 2021-07-21 DIAGNOSIS — I517 Cardiomegaly: Secondary | ICD-10-CM | POA: Diagnosis not present

## 2021-07-21 DIAGNOSIS — E1129 Type 2 diabetes mellitus with other diabetic kidney complication: Secondary | ICD-10-CM | POA: Diagnosis not present

## 2021-07-21 DIAGNOSIS — Z888 Allergy status to other drugs, medicaments and biological substances status: Secondary | ICD-10-CM

## 2021-07-21 DIAGNOSIS — M7040 Prepatellar bursitis, unspecified knee: Secondary | ICD-10-CM | POA: Diagnosis present

## 2021-07-21 DIAGNOSIS — N281 Cyst of kidney, acquired: Secondary | ICD-10-CM | POA: Diagnosis not present

## 2021-07-21 DIAGNOSIS — Z452 Encounter for adjustment and management of vascular access device: Secondary | ICD-10-CM | POA: Diagnosis not present

## 2021-07-21 DIAGNOSIS — N179 Acute kidney failure, unspecified: Secondary | ICD-10-CM | POA: Diagnosis not present

## 2021-07-21 DIAGNOSIS — I5033 Acute on chronic diastolic (congestive) heart failure: Secondary | ICD-10-CM | POA: Diagnosis present

## 2021-07-21 DIAGNOSIS — M8580 Other specified disorders of bone density and structure, unspecified site: Secondary | ICD-10-CM | POA: Diagnosis not present

## 2021-07-21 DIAGNOSIS — Z79890 Hormone replacement therapy (postmenopausal): Secondary | ICD-10-CM

## 2021-07-21 LAB — CBC WITH DIFFERENTIAL/PLATELET
Abs Immature Granulocytes: 0.54 10*3/uL — ABNORMAL HIGH (ref 0.00–0.07)
Basophils Absolute: 0.1 10*3/uL (ref 0.0–0.1)
Basophils Relative: 0 %
Eosinophils Absolute: 0.1 10*3/uL (ref 0.0–0.5)
Eosinophils Relative: 0 %
HCT: 35 % — ABNORMAL LOW (ref 36.0–46.0)
Hemoglobin: 10.8 g/dL — ABNORMAL LOW (ref 12.0–15.0)
Immature Granulocytes: 2 %
Lymphocytes Relative: 8 %
Lymphs Abs: 2.3 10*3/uL (ref 0.7–4.0)
MCH: 28.6 pg (ref 26.0–34.0)
MCHC: 30.9 g/dL (ref 30.0–36.0)
MCV: 92.8 fL (ref 80.0–100.0)
Monocytes Absolute: 2.1 10*3/uL — ABNORMAL HIGH (ref 0.1–1.0)
Monocytes Relative: 8 %
Neutro Abs: 22.7 10*3/uL — ABNORMAL HIGH (ref 1.7–7.7)
Neutrophils Relative %: 82 %
Platelets: 460 10*3/uL — ABNORMAL HIGH (ref 150–400)
RBC: 3.77 MIL/uL — ABNORMAL LOW (ref 3.87–5.11)
RDW: 16.4 % — ABNORMAL HIGH (ref 11.5–15.5)
WBC: 27.8 10*3/uL — ABNORMAL HIGH (ref 4.0–10.5)
nRBC: 0 % (ref 0.0–0.2)

## 2021-07-21 LAB — LACTIC ACID, PLASMA
Lactic Acid, Venous: 0.9 mmol/L (ref 0.5–1.9)
Lactic Acid, Venous: 0.8 mmol/L (ref 0.5–1.9)

## 2021-07-21 LAB — BASIC METABOLIC PANEL
Anion gap: 11 (ref 5–15)
BUN: 39 mg/dL — ABNORMAL HIGH (ref 8–23)
CO2: 24 mmol/L (ref 22–32)
Calcium: 8.4 mg/dL — ABNORMAL LOW (ref 8.9–10.3)
Chloride: 101 mmol/L (ref 98–111)
Creatinine, Ser: 1.66 mg/dL — ABNORMAL HIGH (ref 0.44–1.00)
GFR, Estimated: 31 mL/min — ABNORMAL LOW (ref >60)
Glucose, Bld: 53 mg/dL — ABNORMAL LOW (ref 70–99)
Potassium: 3.7 mmol/L (ref 3.5–5.1)
Sodium: 136 mmol/L (ref 135–145)

## 2021-07-21 LAB — PROTIME-INR
INR: 1.6 — ABNORMAL HIGH (ref 0.8–1.2)
Prothrombin Time: 18.6 seconds — ABNORMAL HIGH (ref 11.4–15.2)

## 2021-07-21 LAB — GLUCOSE, CAPILLARY
Glucose-Capillary: 185 mg/dL — ABNORMAL HIGH (ref 70–99)
Glucose-Capillary: 85 mg/dL (ref 70–99)

## 2021-07-21 LAB — CBG MONITORING, ED: Glucose-Capillary: 69 mg/dL — ABNORMAL LOW (ref 70–99)

## 2021-07-21 MED ORDER — GABAPENTIN 300 MG PO CAPS: 300.0000 mg | ORAL_CAPSULE | Freq: Three times a day (TID) | ORAL | Status: DC

## 2021-07-21 MED ORDER — ONDANSETRON HCL 4 MG/2ML IJ SOLN: 4.0000 mg | Freq: Four times a day (QID) | INTRAMUSCULAR | Status: DC | PRN

## 2021-07-21 MED ORDER — CEFAZOLIN SODIUM-DEXTROSE 1-4 GM/50ML-% IV SOLN: 1.0000 g | Freq: Once | INTRAVENOUS | Status: DC

## 2021-07-21 MED ORDER — CEFAZOLIN SODIUM-DEXTROSE 2-4 GM/100ML-% IV SOLN
2.0000 g | Freq: Once | INTRAVENOUS | Status: AC
  Administered 2021-07-21: 2 g via INTRAVENOUS
  Filled 2021-07-21: qty 100

## 2021-07-21 MED ORDER — AMLODIPINE BESYLATE 5 MG PO TABS
5.0000 mg | ORAL_TABLET | Freq: Every day | ORAL | Status: DC
  Administered 2021-07-22 – 2021-07-24 (×3): 5 mg via ORAL
  Filled 2021-07-21 (×3): qty 1

## 2021-07-21 MED ORDER — METOPROLOL SUCCINATE ER 50 MG PO TB24
100.0000 mg | ORAL_TABLET | Freq: Every day | ORAL | Status: DC
  Administered 2021-07-22 – 2021-07-27 (×6): 100 mg via ORAL
  Filled 2021-07-21 (×4): qty 2
  Filled 2021-07-21: qty 4
  Filled 2021-07-21: qty 2

## 2021-07-21 MED ORDER — ATORVASTATIN CALCIUM 40 MG PO TABS
40.0000 mg | ORAL_TABLET | Freq: Every day | ORAL | Status: DC
  Administered 2021-07-22 – 2021-07-23 (×2): 40 mg via ORAL
  Filled 2021-07-21 (×2): qty 1

## 2021-07-21 MED ORDER — FENTANYL CITRATE PF 50 MCG/ML IJ SOSY
50.0000 ug | PREFILLED_SYRINGE | INTRAMUSCULAR | Status: DC | PRN
  Administered 2021-07-21: 50 ug via INTRAVENOUS
  Filled 2021-07-21: qty 1

## 2021-07-21 MED ORDER — TORSEMIDE 20 MG PO TABS
80.0000 mg | ORAL_TABLET | Freq: Every day | ORAL | Status: DC
  Administered 2021-07-22: 80 mg via ORAL
  Filled 2021-07-21: qty 4

## 2021-07-21 MED ORDER — ACETAMINOPHEN 650 MG RE SUPP: 650.0000 mg | Freq: Four times a day (QID) | RECTAL | Status: DC | PRN

## 2021-07-21 MED ORDER — ALBUTEROL SULFATE (2.5 MG/3ML) 0.083% IN NEBU: 2.5000 mg | INHALATION_SOLUTION | RESPIRATORY_TRACT | Status: DC | PRN

## 2021-07-21 MED ORDER — VANCOMYCIN HCL 2000 MG/400ML IV SOLN
2000.0000 mg | Freq: Once | INTRAVENOUS | Status: AC
  Administered 2021-07-21: 2000 mg via INTRAVENOUS
  Filled 2021-07-21 (×2): qty 400

## 2021-07-21 MED ORDER — ACETAMINOPHEN 325 MG PO TABS: 650.0000 mg | ORAL_TABLET | Freq: Four times a day (QID) | ORAL | Status: DC | PRN

## 2021-07-21 MED ORDER — ALLOPURINOL 100 MG PO TABS
100.0000 mg | ORAL_TABLET | Freq: Two times a day (BID) | ORAL | Status: DC
  Administered 2021-07-22 – 2021-07-27 (×11): 100 mg via ORAL
  Filled 2021-07-21 (×12): qty 1

## 2021-07-21 MED ORDER — LOSARTAN POTASSIUM 50 MG PO TABS
50.0000 mg | ORAL_TABLET | Freq: Every day | ORAL | Status: DC
  Administered 2021-07-22: 50 mg via ORAL
  Filled 2021-07-21: qty 1

## 2021-07-21 MED ORDER — GABAPENTIN 300 MG PO CAPS
300.0000 mg | ORAL_CAPSULE | Freq: Three times a day (TID) | ORAL | Status: DC
  Administered 2021-07-22 – 2021-07-27 (×16): 300 mg via ORAL
  Filled 2021-07-21 (×17): qty 1

## 2021-07-21 MED ORDER — AMIODARONE HCL 200 MG PO TABS
200.0000 mg | ORAL_TABLET | Freq: Every day | ORAL | Status: DC
  Administered 2021-07-22 – 2021-07-26 (×5): 200 mg via ORAL
  Filled 2021-07-21 (×6): qty 1

## 2021-07-21 MED ORDER — INSULIN ASPART 100 UNIT/ML IJ SOLN
0.0000 [IU] | INTRAMUSCULAR | Status: DC
  Administered 2021-07-22: 2 [IU] via SUBCUTANEOUS
  Administered 2021-07-22: 3 [IU] via SUBCUTANEOUS
  Administered 2021-07-22 – 2021-07-23 (×2): 5 [IU] via SUBCUTANEOUS
  Administered 2021-07-23 (×2): 11 [IU] via SUBCUTANEOUS

## 2021-07-21 MED ORDER — MORPHINE SULFATE (PF) 2 MG/ML IV SOLN
2.0000 mg | INTRAVENOUS | Status: DC | PRN
  Administered 2021-07-21 – 2021-07-22 (×3): 2 mg via INTRAVENOUS
  Filled 2021-07-21 (×4): qty 1

## 2021-07-21 MED ORDER — LEVOTHYROXINE SODIUM 100 MCG PO TABS
200.0000 ug | ORAL_TABLET | Freq: Every day | ORAL | Status: DC
  Administered 2021-07-22 – 2021-07-27 (×6): 200 ug via ORAL
  Filled 2021-07-21 (×6): qty 2

## 2021-07-21 MED ORDER — SODIUM CHLORIDE 0.45 % IV SOLN: INTRAVENOUS | Status: DC

## 2021-07-21 MED ORDER — ONDANSETRON HCL 4 MG/2ML IJ SOLN
4.0000 mg | Freq: Once | INTRAMUSCULAR | Status: AC
  Administered 2021-07-21: 4 mg via INTRAVENOUS
  Filled 2021-07-21: qty 2

## 2021-07-21 MED ORDER — SODIUM CHLORIDE 0.9 % IV SOLN
2.0000 g | INTRAVENOUS | Status: DC
  Administered 2021-07-21 – 2021-07-23 (×3): 2 g via INTRAVENOUS
  Filled 2021-07-21 (×4): qty 20

## 2021-07-21 MED ORDER — VANCOMYCIN HCL 1250 MG/250ML IV SOLN: 1250.0000 mg | INTRAVENOUS | Status: DC

## 2021-07-21 MED ORDER — BUDESON-GLYCOPYRROL-FORMOTEROL 160-9-4.8 MCG/ACT IN AERO: 2.0000 | INHALATION_SPRAY | Freq: Two times a day (BID) | RESPIRATORY_TRACT | Status: DC

## 2021-07-21 MED ORDER — ONDANSETRON HCL 4 MG PO TABS: 4.0000 mg | ORAL_TABLET | Freq: Four times a day (QID) | ORAL | Status: DC | PRN

## 2021-07-21 NOTE — ED Notes (Signed)
Pt reports shortness of breath - pt placed on 2L ?

## 2021-07-21 NOTE — ED Provider Notes (Signed)
? ?Emergency Department Provider Note ? ? ?I have reviewed the triage vital signs and the nursing notes. ? ? ?HISTORY ? ?Chief Complaint ?Knee Pain ? ? ?HPI ?Kayla [name] is a 81 y.o. female returns to the ED with continued right knee pain with swelling and redness.  She has a history of septic bursitis in the same knee requiring admission in January.  She states this time her knee hurts in a similar location but the swelling in redness are worse than before.  She describes chills last night.  She did not take her temperature to confirm a fever but has been feeling fatigue in addition to worsening pain.  She tells me she saw Dr. Alvan Dame in the office this past week and fluid was taken off the knee. Symptoms have worsened since then.  ? ? ?Past Medical History:  ?Diagnosis Date  ? Allergy   ? Anxiety   ? Arthritis   ? CAD (coronary artery disease)   ? Mild per remote cath in 2006, /    Nuclear, January, 2013, no significant abnormality,  ? Carotid artery disease ([place])   ? Doppler, January, 2013, 0 25% RIC A., 42-70% LICA, stable  ? Cataract   ? CHF (congestive heart failure) ([place])   ? Chronic kidney disease   ? Complication of anesthesia   ? wakes up "mean"  ? COPD 02/16/2007  ? Intolerant of Spiriva, tudorza Intolerant of Trelegy   ? COPD (chronic obstructive pulmonary disease) ([place])   ? Coronary atherosclerosis 11/24/2008  ? Catheterization 2006, nonobstructive coronary disease   //   nuclear 2013, low risk    ? Diabetes mellitus, type 2 ([place])   ? Diverticula of colon   ? DIVERTICULOSIS OF COLON 03/23/2007  ? Qualifier: Diagnosis of  By: Halford Chessman MD, Vineet    ? Dizziness   ? occasional  ? Dizzy spells 05/04/2011  ? DM (diabetes mellitus) ([place]) 03/23/2007  ? Qualifier: Diagnosis of  By: Halford Chessman MD, Vineet    ? Dyspnea   ? On exertion  ? Ejection fraction   ? Essential hypertension 02/16/2007  ? Qualifier: Diagnosis of  By: Rosana Hoes CMA, Tammy    ? G E R D 03/23/2007  ? Qualifier: Diagnosis of  By: Halford Chessman MD, Vineet    ? Gall  bladder disease 04/28/2016  ? Gastritis and gastroduodenitis   ? GERD (gastroesophageal reflux disease)   ? Goiter   ? hx of  ? Headache(784.0)   ? migraine  ? Hyperlipidemia   ? Hypertension   ? Hypothyroidism   ? HYPOTHYROIDISM, POSTSURGICAL 06/04/2008  ? Qualifier: Diagnosis of  By: Loanne Drilling MD, Jacelyn Pi   ? Left ventricular hypertrophy   ? Leg edema   ? occasional swelling  ? Leg swelling 12/09/2018  ? Myofascial pain syndrome, cervical 01/06/2018  ? Obesity   ? OBESITY 11/24/2008  ? Qualifier: Diagnosis of  By: Sidney Ace    ? OBSTRUCTIVE SLEEP APNEA 02/16/2007  ? Auto CPAP 09/03/13 to 10/02/13 >> used on 30 of 30 nights with average 6 hrs and 3 min.  Average AHI is 3.1 with median CPAP 5 cm H2O and 95 th percentile CPAP 6 cm H2O.    ? Occipital neuralgia of right side 01/06/2018  ? OSA (obstructive sleep apnea)   ? cpap setting of 12  ? Pain in joint of right hip 01/21/2018  ? Palpitations 01/21/2011  ? 48 hour Holter, 2015, scattered PACs and PVCs.   ? Paroxysmal atrial fibrillation (  [place])   ? Pneumonia   ? PONV (postoperative nausea and vomiting)   ? severe after every surgery  ? Pulmonary hypertension, secondary   ? RHINITIS 07/21/2010  ?     ? RUQ abdominal pain   ? S/P left TKA 07/27/2011  ? Sinus node dysfunction ([place]) 11/08/2015  ? Sleep apnea   ? Spinal stenosis of cervical region 11/07/2014  ? Urinary frequency 12/09/2018  ? UTI (urinary tract infection) 08/15/2019  ? per pt report  ? VENTRICULAR HYPERTROPHY, LEFT 11/24/2008  ? Qualifier: Diagnosis of  By: Sidney Ace    ? ? ?Review of Systems ? ?Constitutional: No fever. Positive chills.  ?Eyes: No visual changes. ?ENT: No sore throat. ?Cardiovascular: Denies chest pain. ?Respiratory: Denies shortness of breath. ?Gastrointestinal: No abdominal pain.  No nausea, no vomiting.  No diarrhea.  No constipation. ?Genitourinary: Negative for dysuria. ?Musculoskeletal: Negative for back pain. Positive right knee pain and swelling.  ?Skin: Right knee redness  and swelling.  ?Neurological: Negative for headaches, focal weakness or numbness. ? ? ?____________________________________________ ? ? ?PHYSICAL EXAM: ? ?VITAL SIGNS: ?ED Triage Vitals  ?Enc Vitals Group  ?   BP 07/21/21 1333 (!) 163/58  ?   Pulse Rate 07/21/21 1333 65  ?   Resp 07/21/21 1333 18  ?   Temp 07/21/21 1333 98.4 ?F (36.9 ?C)  ?   Temp Source 07/21/21 1333 Oral  ?   SpO2 07/21/21 1333 91 %  ?   Weight 07/21/21 1333 230 lb (104.3 kg)  ?   Height 07/21/21 1333 '5\' 5"'$  (1.651 m)  ? ?Constitutional: Alert and oriented. Well appearing and in no acute distress. ?Eyes: Conjunctivae are normal.  ?Head: Atraumatic. ?Nose: No congestion/rhinnorhea. ?Mouth/Throat: Mucous membranes are moist.  ?Neck: No stridor.   ?Cardiovascular: Normal rate, regular rhythm. Good peripheral circulation. Grossly normal heart sounds.   ?Respiratory: Normal respiratory effort.  No retractions. Lungs CTAB. ?Gastrointestinal: No distention.  ?Musculoskeletal: Knee redness/swelling to the right knee with fluctuant area superior to the patella on the right.  ?Neurologic:  Normal speech and language.  ?Skin:  Skin is warm with cellulitis to the right knee.  ? ? ? ?____________________________________________ ?  ?LABS ?(all labs ordered are listed, but only abnormal results are displayed) ? ?Labs Reviewed  ?SURGICAL PCR SCREEN - Abnormal; Notable for the following components:  ?    Result Value  ? MRSA, PCR POSITIVE (*)   ? Staphylococcus aureus POSITIVE (*)   ? All other components within normal limits  ?CBC WITH DIFFERENTIAL/PLATELET - Abnormal; Notable for the following components:  ? WBC 27.8 (*)   ? RBC 3.77 (*)   ? Hemoglobin 10.8 (*)   ? HCT 35.0 (*)   ? RDW 16.4 (*)   ? Platelets 460 (*)   ? Neutro Abs 22.7 (*)   ? Monocytes Absolute 2.1 (*)   ? Abs Immature Granulocytes 0.54 (*)   ? All other components within normal limits  ?BASIC METABOLIC PANEL - Abnormal; Notable for the following components:  ? Glucose, Bld 53 (*)   ? BUN 39 (*)    ? Creatinine, Ser 1.66 (*)   ? Calcium 8.4 (*)   ? GFR, Estimated 31 (*)   ? All other components within normal limits  ?URINALYSIS, ROUTINE W REFLEX MICROSCOPIC - Abnormal; Notable for the following components:  ? APPearance CLOUDY (*)   ? Glucose, UA 50 (*)   ? Protein, ur >=300 (*)   ? Bacteria, UA RARE (*)   ?  All other components within normal limits  ?PROTIME-INR - Abnormal; Notable for the following components:  ? Prothrombin Time 18.6 (*)   ? INR 1.6 (*)   ? All other components within normal limits  ?CBC - Abnormal; Notable for the following components:  ? WBC 20.8 (*)   ? RBC 3.20 (*)   ? Hemoglobin 9.4 (*)   ? HCT 30.3 (*)   ? RDW 16.3 (*)   ? All other components within normal limits  ?BASIC METABOLIC PANEL - Abnormal; Notable for the following components:  ? Sodium 134 (*)   ? Glucose, Bld 110 (*)   ? BUN 45 (*)   ? Creatinine, Ser 1.77 (*)   ? Calcium 7.8 (*)   ? GFR, Estimated 29 (*)   ? All other components within normal limits  ?GLUCOSE, CAPILLARY - Abnormal; Notable for the following components:  ? Glucose-Capillary 185 (*)   ? All other components within normal limits  ?GLUCOSE, CAPILLARY - Abnormal; Notable for the following components:  ? Glucose-Capillary 117 (*)   ? All other components within normal limits  ?GLUCOSE, CAPILLARY - Abnormal; Notable for the following components:  ? Glucose-Capillary 120 (*)   ? All other components within normal limits  ?CBG MONITORING, ED - Abnormal; Notable for the following components:  ? Glucose-Capillary 69 (*)   ? All other components within normal limits  ?CULTURE, BLOOD (ROUTINE X 2)  ?CULTURE, BLOOD (ROUTINE X 2)  ?LACTIC ACID, PLASMA  ?LACTIC ACID, PLASMA  ?GLUCOSE, CAPILLARY  ? ?____________________________________________ ? ?RADIOLOGY ? ?DG Chest 2 View ? ?Result Date: 07/21/2021 ?CLINICAL DATA:  Right knee pain with infection. EXAM: CHEST - 2 VIEW COMPARISON:  03/18/2021 and CT chest 11/14/2020. FINDINGS: Image quality is degraded by body habitus  and apical lordotic technique. Right atrial pacemaker lead tip is visualized. The other pacemaker lead tip is poorly visualized on the frontal view for reasons as stated. Bibasilar scarring. Right he

## 2021-07-21 NOTE — Assessment & Plan Note (Addendum)
Patient is on albuterol and Breztri as an outpatient. ?-Continue with albuterol TID  ?-Continue Breztri ?-Incentive spirometry. ?-Report chronic shortness of breath. ?-she qualifies for home oxygen, CM will arrange.  ?

## 2021-07-21 NOTE — Assessment & Plan Note (Signed)
Noted  

## 2021-07-21 NOTE — Assessment & Plan Note (Addendum)
-  Continue Synthroid 200 mcg ?

## 2021-07-21 NOTE — ED Triage Notes (Signed)
Pt presents with c/o knee pain. Pt has had some fluid drawn off of her right knee as well as a a recent procedure done. Pt's knee is very swollen and red. Pt reports significant pain in that knee.  ?

## 2021-07-21 NOTE — Assessment & Plan Note (Addendum)
Body mass index is 38.27 kg/m?Marland Kitchen  ?Needs lifestyle modification ?

## 2021-07-21 NOTE — Assessment & Plan Note (Addendum)
Recurrent septic prepatellar Bursitis.  Associated leukocytosis with WBC of 27,800. Orthopedic surgery consulted, underwent  I&D 3/21.  ?-Hemovac removed 3/23. ?-Follow wound culture: MRSA  Staph Aureus.  ?-Follow-up blood cultures: No growth to date.  ?-ID consulted, Vancomycin change to Daptomycin die to AKI.  ?-She will be discharge on Daptomycin for 6 weeks.  ?-stable for  discharge today. No further confusion.  ? ?

## 2021-07-21 NOTE — Plan of Care (Signed)
  Problem: Education: Goal: Knowledge of General Education information will improve Description: Including pain rating scale, medication(s)/side effects and non-pharmacologic comfort measures Outcome: Progressing   Problem: Pain Managment: Goal: General experience of comfort will improve Outcome: Progressing   Problem: Safety: Goal: Ability to remain free from injury will improve Outcome: Progressing   

## 2021-07-21 NOTE — Assessment & Plan Note (Signed)
Continue CPAP.  

## 2021-07-21 NOTE — Assessment & Plan Note (Addendum)
Patient is on losartan, Toprol XL and amlodipine as an outpatient. ?-Continue Toprol XL and amlodipine. ?-Hold Cozaar due to AKI ?

## 2021-07-21 NOTE — Assessment & Plan Note (Addendum)
Patient is on Lantus 110 units am and 90 at hs. > and Novolog sliding scale as an outpatient. ?- Hypoglycemia while in the ED with blood sugar of 53. ?-Develops Hyperglycemia, in setting of steroids, and Lantus was on hold.  ?-She was transfer to Step down 3/22 , started on insulin gtt. Subsequently  transition to semglee. ? and SSI.  ?-Discharge on to 55 units BID>  ?CBG better controlled.  ?

## 2021-07-21 NOTE — Progress Notes (Signed)
A consult was received from an ED physician for vancomycin & cefazoline per pharmacy dosing.  The patient's profile has been reviewed for ht/wt/allergies/indication/available labs.   ? ?A one time order has been placed for vancomycin 2 gm & cefazolin 2 gm.   ? ?Further antibiotics/pharmacy consults should be ordered by admitting physician if indicated.       ?                ?Thank you, ? ?Eudelia Bunch, Pharm.D ?07/21/2021 5:11 PM ? ?

## 2021-07-21 NOTE — Progress Notes (Signed)
Remote pacemaker transmission.   

## 2021-07-21 NOTE — Assessment & Plan Note (Addendum)
-  Continue Lipitor °

## 2021-07-21 NOTE — H&P (Signed)
?History and Physical  ? ? ?Patient: Kayla [name] MCN:[id number] DOB: 23-Jul-1940 ?DOA: 07/21/2021 ?DOS: the patient was seen and examined on 07/21/2021 ?PCP: [name], MD  ?Patient coming from: Home ? ?Chief Complaint:  ?Chief Complaint  ?Patient presents with  ? Knee Pain  ? ?HPI: Kayla [name] is a 81 y.o. female with medical history significant of CHF, CKD stage 3b, COPD, chronic pain, CAD, diabetes mellitus on insulin, hypertension, paroxysmal atrial fibrillation. Right knee pain that started three days ago with progressive worsening. This morning, pain was bad enough that she decided to see her orthopedic surgeon, unfortunately he was not available; she was seen in their urgent care and was sent to the emergency department. Patient reports taking hydrocodone and Tylenol but it was not helping much with her pain. She reports subjective fever, chills, nausea. No emesis. No diarrhea. Patient reports taking her morning medications. ? ?Review of Systems: As mentioned in the history of present illness. All other systems reviewed and are negative. ?Past Medical History:  ?Diagnosis Date  ? Allergy   ? Anxiety   ? Arthritis   ? CAD (coronary artery disease)   ? Mild per remote cath in 2006, /    Nuclear, January, 2013, no significant abnormality,  ? Carotid artery disease ([place])   ? Doppler, January, 2013, 0 62% RIC A., 94-76% LICA, stable  ? Cataract   ? CHF (congestive heart failure) ([place])   ? Chronic kidney disease   ? Complication of anesthesia   ? wakes up "mean"  ? COPD 02/16/2007  ? Intolerant of Spiriva, tudorza Intolerant of Trelegy   ? COPD (chronic obstructive pulmonary disease) ([place])   ? Coronary atherosclerosis 11/24/2008  ? Catheterization 2006, nonobstructive coronary disease   //   nuclear 2013, low risk    ? Diabetes mellitus, type 2 ([place])   ? Diverticula of colon   ? DIVERTICULOSIS OF COLON 03/23/2007  ? Qualifier: Diagnosis of  By: Halford Chessman MD, Vineet    ? Dizziness   ? occasional  ? Dizzy  spells 05/04/2011  ? DM (diabetes mellitus) ([place]) 03/23/2007  ? Qualifier: Diagnosis of  By: Halford Chessman MD, Vineet    ? Dyspnea   ? On exertion  ? Ejection fraction   ? Essential hypertension 02/16/2007  ? Qualifier: Diagnosis of  By: Rosana Hoes CMA, Tammy    ? G E R D 03/23/2007  ? Qualifier: Diagnosis of  By: Halford Chessman MD, Vineet    ? Gall bladder disease 04/28/2016  ? Gastritis and gastroduodenitis   ? GERD (gastroesophageal reflux disease)   ? Goiter   ? hx of  ? Headache(784.0)   ? migraine  ? Hyperlipidemia   ? Hypertension   ? Hypothyroidism   ? HYPOTHYROIDISM, POSTSURGICAL 06/04/2008  ? Qualifier: Diagnosis of  By: Loanne Drilling MD, Jacelyn Pi   ? Left ventricular hypertrophy   ? Leg edema   ? occasional swelling  ? Leg swelling 12/09/2018  ? Myofascial pain syndrome, cervical 01/06/2018  ? Obesity   ? OBESITY 11/24/2008  ? Qualifier: Diagnosis of  By: Sidney Ace    ? OBSTRUCTIVE SLEEP APNEA 02/16/2007  ? Auto CPAP 09/03/13 to 10/02/13 >> used on 30 of 30 nights with average 6 hrs and 3 min.  Average AHI is 3.1 with median CPAP 5 cm H2O and 95 th percentile CPAP 6 cm H2O.    ? Occipital neuralgia of right side 01/06/2018  ? OSA (obstructive sleep apnea)   ? cpap setting  of 12  ? Pain in joint of right hip 01/21/2018  ? Palpitations 01/21/2011  ? 48 hour Holter, 2015, scattered PACs and PVCs.   ? Paroxysmal atrial fibrillation ([facility])   ? Pneumonia   ? PONV (postoperative nausea and vomiting)   ? severe after every surgery  ? Pulmonary hypertension, secondary   ? RHINITIS 07/21/2010  ?     ? RUQ abdominal pain   ? S/P left TKA 07/27/2011  ? Sinus node dysfunction ([place]) 11/08/2015  ? Sleep apnea   ? Spinal stenosis of cervical region 11/07/2014  ? Urinary frequency 12/09/2018  ? UTI (urinary tract infection) 08/15/2019  ? per pt report  ? VENTRICULAR HYPERTROPHY, LEFT 11/24/2008  ? Qualifier: Diagnosis of  By: Sidney Ace    ? ?Past Surgical History:  ?Procedure Laterality Date  ? ABDOMINAL HYSTERECTOMY    ? with appendectomy and  colon polypectomy  ? AMPUTATION TOE Left 12/15/2019  ? Procedure: AMPUTATION  LEFT GREAT TOE;  Surgeon: Evelina Bucy, DPM;  Location: [facility];  Service: Podiatry;  Laterality: Left;  ? ANTERIOR CERVICAL DECOMP/DISCECTOMY FUSION N/A 11/07/2014  ? Procedure: Anterior Cervical diskectomy and fusion Cervical four-five, Cervical five-six, Cervical six-seven;  Surgeon: Kary Kos, MD;  Location: [facility];  Service: Neurosurgery;  Laterality: N/A;  ? APPENDECTOMY    ? ARTERY BIOPSY Right 02/16/2017  ? Procedure: BIOPSY TEMPORAL ARTERY;  Surgeon: Katha Cabal, MD;  Location: [facility] ORS;  Service: Vascular;  Laterality: Right;  ? ARTERY BIOPSY Left 04/16/2020  ? Procedure: BIOPSY TEMPORAL ARTERY;  Surgeon: Katha Cabal, MD;  Location: [facility] ORS;  Service: Vascular;  Laterality: Left;  LEFT TEMPLE  ? BACK SURGERY    ? CARDIAC CATHETERIZATION    ? CATARACT EXTRACTION W/ INTRAOCULAR LENS  IMPLANT, BILATERAL Bilateral   ? CHOLECYSTECTOMY N/A 04/29/2016  ? Procedure: LAPAROSCOPIC CHOLECYSTECTOMY WITH INTRAOPERATIVE CHOLANGIOGRAM;  Surgeon: Alphonsa Overall, MD;  Location: [facility];  Service: General;  Laterality: N/A;  ? EP IMPLANTABLE DEVICE N/A 11/08/2015  ? MRI COMPATABLE -- Procedure: Pacemaker Implant;  Surgeon: Deboraha Sprang, MD;  Location: [facility] LAB;  Service: Cardiovascular;  Laterality: N/A;  ? INCISION AND DRAINAGE ABSCESS Right 05/30/2021  ? Procedure: IRRIGATION AND DEBRIDEMENT OF PREPATELLA BURSA;  Surgeon: Paralee Cancel, MD;  Location: [facility];  Service: Orthopedics;  Laterality: Right;  60 ?wants standard knee draping and pulse lavage  ? INSERT / REPLACE / REMOVE PACEMAKER  2017  ? Dr. Jens Som [facility])  ? JOINT REPLACEMENT    ? LUMBAR DISC SURGERY  1990's  ? x 2  ? THYROIDECTOMY  2011  ? TOTAL KNEE ARTHROPLASTY  07/27/2011  ? Procedure: TOTAL KNEE ARTHROPLASTY;  Surgeon: Mauri Pole, MD;  Location: [facility];  Service: Orthopedics;  Laterality: Left;  ? UPPER ESOPHAGEAL ENDOSCOPIC ULTRASOUND (EUS) N/A  05/14/2016  ? Procedure: UPPER ESOPHAGEAL ENDOSCOPIC ULTRASOUND (EUS);  Surgeon: Milus Banister, MD;  Location: [facility];  Service: Endoscopy;  Laterality: N/A;  radial only-will be done mod if needed  ? ?Social History:  reports that she quit smoking about 24 years ago. Her smoking use included cigarettes. She started smoking about 57 years ago. She has a 66.00 pack-year smoking history. She has never used smokeless tobacco. She reports that she does not drink alcohol and does not use drugs. ? ?Allergies  ?Allergen Reactions  ? Dulaglutide Nausea Only, Other (See Comments) and Cough  ?  Made the patient sick to her stomach (only) several  days after using it ?Trulicity  ? Oxycodone Itching  ?  Pt still takes this med even thought it causes itching  ? Trelegy Ellipta [Fluticasone-Umeclidin-Vilant] Other (See Comments)  ?  unknown  ? ? ?Family History  ?Problem Relation Age of Onset  ? Heart Problems Mother   ? Heart defect Mother   ?     valve problems  ? Heart Problems Father   ? Heart disease Brother   ? Lung cancer Daughter   ?     non small cell   ? Diabetes Maternal Aunt   ? Diabetes Maternal Uncle   ? Throat cancer Brother   ? Colon cancer Neg Hx   ? Colon polyps Neg Hx   ? Esophageal cancer Neg Hx   ? Rectal cancer Neg Hx   ? Stomach cancer Neg Hx   ? ? ?Prior to Admission medications   ?Medication Sig Start Date End Date Taking? Authorizing Provider  ?acetaminophen (TYLENOL) 500 MG tablet Take 1,000 mg by mouth every 6 (six) hours as needed for moderate pain (for headaches).     [provider]  ?albuterol (PROVENTIL) (2.5 MG/3ML) 0.083% nebulizer solution Take 3 mLs (2.5 mg total) by nebulization every 2 (two) hours as needed for wheezing or shortness of breath. ?Patient taking differently: Take 2.5 mg by nebulization in the morning, at noon, and at bedtime. 05/15/20   Chesley Mires, MD  ?albuterol (VENTOLIN HFA) 108 (90 Base) MCG/ACT inhaler Inhale 2 puffs into the lungs every 6 (six) hours as  needed for wheezing or shortness of breath. 01/26/21   Tanda Rockers, MD  ?Alcohol Swabs (B-D SINGLE USE SWABS REGULAR) PADS  11/24/19   [provider]  ?allopurinol (ZYLOPRIM) 100 MG tablet Take

## 2021-07-21 NOTE — Assessment & Plan Note (Addendum)
Baseline creatinine of about 1.6--1.8 ?AKI on CKD stage 3b ?Patient creatinine increased to 1.2. ?Continue to  hold Cozaar on torsemide. ?Received IV fluids.  ?Renal US; simple renal cyst. No others abnormalities.  ?Cr down to 2.0--1.8 ?Nephrology consulted. Considering ATN.  ?Stable. Plan to resume torsemide at discharge, hold cozaar for one week then resume.  ?

## 2021-07-21 NOTE — Progress Notes (Signed)
Pharmacy Antibiotic Note ? ?Kayla [name] is a 81 y.o. female admitted on 07/21/2021 with R knee septic bursitits.  Pharmacy has been consulted for vancomycin dosing. ? ?Plan: ?Vancomycin 2 gm followed by vancomycin 1250 mg IV q48 hrs for est AUC 487.4 using SCr 1.66 and Vd 0.5 for BMI 38 ?Ceftriaxone 2 gm IV q24 ?F/u renal fxn, WBC, temp, culture data ?Vancomycin levels as needed ? ?Height: '5\' 5"'$  (165.1 cm) ?Weight: 104.3 kg (230 lb) ?IBW/kg (Calculated) : 57 ? ?Temp (24hrs), Avg:98.4 ?F (36.9 ?C), Min:98.4 ?F (36.9 ?C), Max:98.4 ?F (36.9 ?C) ? ?Recent Labs  ?Lab 07/21/21 ?1421 07/21/21 ?1510 07/21/21 ?1721  ?WBC 27.8*  --   --   ?CREATININE 1.66*  --   --   ?LATICACIDVEN  --  0.9 0.8  ?  ?Estimated Creatinine Clearance: 32.4 mL/min (A) (by C-G formula based on SCr of 1.66 mg/dL (H)).   ? ?Allergies  ?Allergen Reactions  ? Dulaglutide Nausea Only, Other (See Comments) and Cough  ?  Made the patient sick to her stomach (only) several days after using it  ? Oxycodone Itching and Other (See Comments)  ?  Pt still takes this med even thought it causes itching  ? Trelegy Ellipta [Fluticasone-Umeclidin-Vilant] Nausea Only, Other (See Comments) and Cough  ?  Made the patient sick to her stomach (only) several days after using it   ? ?Antimicrobials this admission:  ?3/20 ancef 2 gm x 1 ?3/20 vanc>> ?3/20 CTX>> ?Dose adjustments this admission:  ? ?Microbiology results:  ?3/20 BCx2 sent ? ?Thank you for allowing pharmacy to be a part of this patient?s care. ? ?Eudelia Bunch, Pharm.D ?07/21/2021 7:25 PM ? ?

## 2021-07-21 NOTE — Assessment & Plan Note (Addendum)
-  Euvolemic. Patient is on torsemide as an outpatient. ?-Developed AKI, torsemide held.  Monitor volume status ?Plan to resume torsemide at discharge.  ?

## 2021-07-21 NOTE — Assessment & Plan Note (Addendum)
Patient is on Eliquis, amiodarone and Toprol XL as an outpatient. ?-Continue amiodarone and Toprol XL ?-Continue with eliquis.  ?

## 2021-07-21 NOTE — ED Provider Triage Note (Signed)
Emergency Medicine Provider Triage Evaluation Note ? ?Kayla [name] , a 81 y.o. female  was evaluated in triage.  Pt complains of right knee pain and infection.  Patient had surgery on 06/02/2021 for an incision and drainage of a right infected right prepatellar bursa.  She notes shortly after discharge, the knee seemed to become infected and swollen again.  She saw her own doctor last week who drained the knee with a needle which improved swelling significantly.  However she notes the knee appears even more swollen, red and likely infected than it ever has.  She has been on antibiotics intermittently over the last few months for this infection.  Also has cellulitis of the right lower leg. She denies fevers, chills, palpitations. ? ?Review of Systems  ?Positive: Right knee swelling and pain ?Negative: Fever, chills ? ?Physical Exam  ?BP (!) 163/58 (BP Location: Left Arm)   Pulse 65   Temp 98.4 ?F (36.9 ?C) (Oral)   Resp 18   Ht '5\' 5"'$  (1.651 m)   Wt 104.3 kg   SpO2 91%   BMI 38.27 kg/m?  ?Gen:   Awake, no distress   ?Resp:  Normal effort  ?MSK:   Moves extremities without difficulty; right anterior knee with significant erythema, swelling and effusion.  There is an area of fluctuance just proximal to the patella indicative of likely abscess.  ROM is intact despite pain. ?Other:   ? ?Medical Decision Making  ?Medically screening exam initiated at 2:45 PM.  Appropriate orders placed.  Kayla [name] was informed that the remainder of the evaluation will be completed by another provider, this initial triage assessment does not replace that evaluation, and the importance of remaining in the ED until their evaluation is complete. ? ? ?  ?Tonye Pearson, [place] ?07/21/21 1448 ? ?

## 2021-07-22 ENCOUNTER — Encounter (HOSPITAL_COMMUNITY): Payer: Self-pay | Admitting: Family Medicine

## 2021-07-22 ENCOUNTER — Inpatient Hospital Stay (HOSPITAL_COMMUNITY): Payer: Medicare HMO

## 2021-07-22 ENCOUNTER — Inpatient Hospital Stay (HOSPITAL_COMMUNITY): Payer: Medicare HMO | Admitting: Anesthesiology

## 2021-07-22 ENCOUNTER — Encounter (HOSPITAL_COMMUNITY)
Admission: EM | Disposition: A | Discharge status: Home Health | Payer: Self-pay | Source: Home / Self Care | Attending: Internal Medicine

## 2021-07-22 ENCOUNTER — Other Ambulatory Visit: Payer: Self-pay

## 2021-07-22 DIAGNOSIS — M7989 Other specified soft tissue disorders: Secondary | ICD-10-CM

## 2021-07-22 DIAGNOSIS — M71161 Other infective bursitis, right knee: Secondary | ICD-10-CM | POA: Diagnosis not present

## 2021-07-22 DIAGNOSIS — M7041 Prepatellar bursitis, right knee: Secondary | ICD-10-CM

## 2021-07-22 DIAGNOSIS — Z9989 Dependence on other enabling machines and devices: Secondary | ICD-10-CM

## 2021-07-22 DIAGNOSIS — I5032 Chronic diastolic (congestive) heart failure: Secondary | ICD-10-CM | POA: Diagnosis not present

## 2021-07-22 DIAGNOSIS — I10 Essential (primary) hypertension: Secondary | ICD-10-CM

## 2021-07-22 DIAGNOSIS — E1159 Type 2 diabetes mellitus with other circulatory complications: Secondary | ICD-10-CM | POA: Diagnosis not present

## 2021-07-22 DIAGNOSIS — I152 Hypertension secondary to endocrine disorders: Secondary | ICD-10-CM

## 2021-07-22 DIAGNOSIS — G4733 Obstructive sleep apnea (adult) (pediatric): Secondary | ICD-10-CM

## 2021-07-22 DIAGNOSIS — N1832 Chronic kidney disease, stage 3b: Secondary | ICD-10-CM | POA: Diagnosis not present

## 2021-07-22 HISTORY — PX: INCISION AND DRAINAGE ABSCESS: SHX5864

## 2021-07-22 LAB — URINALYSIS, ROUTINE W REFLEX MICROSCOPIC
Bilirubin Urine: NEGATIVE
Glucose, UA: 50 mg/dL — AB
Hgb urine dipstick: NEGATIVE
Ketones, ur: NEGATIVE mg/dL
Leukocytes,Ua: NEGATIVE
Nitrite: NEGATIVE
Protein, ur: >=300 mg/dL — AB
Specific Gravity, Urine: 1.023 (ref 1.005–1.030)
pH: 5 (ref 5.0–8.0)

## 2021-07-22 LAB — GLUCOSE, CAPILLARY
Glucose-Capillary: 117 mg/dL — ABNORMAL HIGH (ref 70–99)
Glucose-Capillary: 120 mg/dL — ABNORMAL HIGH (ref 70–99)
Glucose-Capillary: 134 mg/dL — ABNORMAL HIGH (ref 70–99)
Glucose-Capillary: 120 mg/dL — ABNORMAL HIGH (ref 70–99)
Glucose-Capillary: 103 mg/dL — ABNORMAL HIGH (ref 70–99)
Glucose-Capillary: 202 mg/dL — ABNORMAL HIGH (ref 70–99)

## 2021-07-22 LAB — BASIC METABOLIC PANEL
Anion gap: 11 (ref 5–15)
BUN: 45 mg/dL — ABNORMAL HIGH (ref 8–23)
CO2: 22 mmol/L (ref 22–32)
Calcium: 7.8 mg/dL — ABNORMAL LOW (ref 8.9–10.3)
Chloride: 101 mmol/L (ref 98–111)
Creatinine, Ser: 1.77 mg/dL — ABNORMAL HIGH (ref 0.44–1.00)
GFR, Estimated: 29 mL/min — ABNORMAL LOW (ref >60)
Glucose, Bld: 110 mg/dL — ABNORMAL HIGH (ref 70–99)
Potassium: 3.7 mmol/L (ref 3.5–5.1)
Sodium: 134 mmol/L — ABNORMAL LOW (ref 135–145)

## 2021-07-22 LAB — CBC
HCT: 30.3 % — ABNORMAL LOW (ref 36.0–46.0)
Hemoglobin: 9.4 g/dL — ABNORMAL LOW (ref 12.0–15.0)
MCH: 29.4 pg (ref 26.0–34.0)
MCHC: 31 g/dL (ref 30.0–36.0)
MCV: 94.7 fL (ref 80.0–100.0)
Platelets: 377 10*3/uL (ref 150–400)
RBC: 3.2 MIL/uL — ABNORMAL LOW (ref 3.87–5.11)
RDW: 16.3 % — ABNORMAL HIGH (ref 11.5–15.5)
WBC: 20.8 10*3/uL — ABNORMAL HIGH (ref 4.0–10.5)
nRBC: 0 % (ref 0.0–0.2)

## 2021-07-22 LAB — SURGICAL PCR SCREEN
MRSA, PCR: POSITIVE — AB
Staphylococcus aureus: POSITIVE — AB

## 2021-07-22 SURGERY — INCISION AND DRAINAGE, ABSCESS
Anesthesia: General | Laterality: Right

## 2021-07-22 MED ORDER — HYDROCODONE-ACETAMINOPHEN 7.5-325 MG PO TABS
1.0000 | ORAL_TABLET | ORAL | Status: DC | PRN
  Administered 2021-07-22 (×2): 1 via ORAL
  Administered 2021-07-23 – 2021-07-27 (×14): 2 via ORAL
  Filled 2021-07-22 (×5): qty 2
  Filled 2021-07-22: qty 1
  Filled 2021-07-22 (×9): qty 2

## 2021-07-22 MED ORDER — FERROUS SULFATE 325 (65 FE) MG PO TABS
325.0000 mg | ORAL_TABLET | Freq: Three times a day (TID) | ORAL | Status: DC
  Administered 2021-07-22 – 2021-07-24 (×5): 325 mg via ORAL
  Filled 2021-07-22 (×5): qty 1

## 2021-07-22 MED ORDER — ONDANSETRON HCL 4 MG/2ML IJ SOLN
INTRAMUSCULAR | Status: DC | PRN
  Administered 2021-07-22: 4 mg via INTRAVENOUS

## 2021-07-22 MED ORDER — BISACODYL 10 MG RE SUPP: 10.0000 mg | Freq: Every day | RECTAL | Status: DC | PRN

## 2021-07-22 MED ORDER — FENTANYL CITRATE PF 50 MCG/ML IJ SOSY
25.0000 ug | PREFILLED_SYRINGE | INTRAMUSCULAR | Status: DC | PRN
  Administered 2021-07-22: 50 ug via INTRAVENOUS

## 2021-07-22 MED ORDER — ONDANSETRON HCL 4 MG/2ML IJ SOLN: 4.0000 mg | Freq: Four times a day (QID) | INTRAMUSCULAR | Status: DC | PRN

## 2021-07-22 MED ORDER — ACETAMINOPHEN 325 MG PO TABS: 325.0000 mg | ORAL_TABLET | Freq: Four times a day (QID) | ORAL | Status: DC | PRN

## 2021-07-22 MED ORDER — SODIUM CHLORIDE 0.9 % IR SOLN
Status: DC | PRN
  Administered 2021-07-22: 1000 mL

## 2021-07-22 MED ORDER — ALBUTEROL SULFATE (2.5 MG/3ML) 0.083% IN NEBU
2.5000 mg | INHALATION_SOLUTION | Freq: Three times a day (TID) | RESPIRATORY_TRACT | Status: DC
  Administered 2021-07-22 – 2021-07-27 (×15): 2.5 mg via RESPIRATORY_TRACT
  Filled 2021-07-22 (×15): qty 3

## 2021-07-22 MED ORDER — PRONTOSAN WOUND IRRIGATION OPTIME
TOPICAL | Status: DC | PRN
  Administered 2021-07-22: 350 mL

## 2021-07-22 MED ORDER — FENTANYL CITRATE PF 50 MCG/ML IJ SOSY
PREFILLED_SYRINGE | INTRAMUSCULAR | Status: AC
  Filled 2021-07-22: qty 1

## 2021-07-22 MED ORDER — DEXAMETHASONE SODIUM PHOSPHATE 10 MG/ML IJ SOLN
10.0000 mg | Freq: Once | INTRAMUSCULAR | Status: AC
  Administered 2021-07-23: 10 mg via INTRAVENOUS
  Filled 2021-07-22: qty 1

## 2021-07-22 MED ORDER — MUPIROCIN 2 % EX OINT
1.0000 "application " | TOPICAL_OINTMENT | Freq: Two times a day (BID) | CUTANEOUS | Status: AC
  Administered 2021-07-22 – 2021-07-26 (×10): 1 via NASAL
  Filled 2021-07-22 (×3): qty 22

## 2021-07-22 MED ORDER — DEXAMETHASONE SODIUM PHOSPHATE 10 MG/ML IJ SOLN
INTRAMUSCULAR | Status: AC
  Filled 2021-07-22: qty 1

## 2021-07-22 MED ORDER — SODIUM CHLORIDE 0.9 % IV SOLN: INTRAVENOUS | Status: DC

## 2021-07-22 MED ORDER — FENTANYL CITRATE PF 50 MCG/ML IJ SOSY
PREFILLED_SYRINGE | INTRAMUSCULAR | Status: AC
  Administered 2021-07-22: 50 ug via INTRAVENOUS
  Filled 2021-07-22: qty 1

## 2021-07-22 MED ORDER — CHLORHEXIDINE GLUCONATE 4 % EX LIQD: 60.0000 mL | Freq: Once | CUTANEOUS | Status: DC

## 2021-07-22 MED ORDER — FENTANYL CITRATE (PF) 100 MCG/2ML IJ SOLN
INTRAMUSCULAR | Status: DC | PRN
Start: 2021-07-22 — End: 2021-07-22
  Administered 2021-07-22 (×4): 25 ug via INTRAVENOUS

## 2021-07-22 MED ORDER — BOOST / RESOURCE BREEZE PO LIQD CUSTOM
1.0000 | Freq: Three times a day (TID) | ORAL | Status: DC
  Administered 2021-07-22 – 2021-07-23 (×3): 1 via ORAL

## 2021-07-22 MED ORDER — LIDOCAINE 2% (20 MG/ML) 5 ML SYRINGE
INTRAMUSCULAR | Status: DC | PRN
  Administered 2021-07-22: 40 mg via INTRAVENOUS

## 2021-07-22 MED ORDER — ONDANSETRON HCL 4 MG/2ML IJ SOLN
INTRAMUSCULAR | Status: AC
  Filled 2021-07-22: qty 2

## 2021-07-22 MED ORDER — SODIUM CHLORIDE 0.9 % IR SOLN
Status: DC | PRN
  Administered 2021-07-22: 3000 mL

## 2021-07-22 MED ORDER — DEXAMETHASONE SODIUM PHOSPHATE 10 MG/ML IJ SOLN
INTRAMUSCULAR | Status: DC | PRN
  Administered 2021-07-22: 10 mg via INTRAVENOUS

## 2021-07-22 MED ORDER — ASPIRIN 81 MG PO CHEW
81.0000 mg | CHEWABLE_TABLET | Freq: Two times a day (BID) | ORAL | Status: DC
  Administered 2021-07-22: 81 mg via ORAL
  Filled 2021-07-22 (×2): qty 1

## 2021-07-22 MED ORDER — HYDROCODONE-ACETAMINOPHEN 7.5-325 MG PO TABS: 1.0000 | ORAL_TABLET | Freq: Four times a day (QID) | ORAL | Status: DC | PRN

## 2021-07-22 MED ORDER — PHENOL 1.4 % MT LIQD
1.0000 | OROMUCOSAL | Status: DC | PRN
  Administered 2021-07-26: 1 via OROMUCOSAL
  Filled 2021-07-22: qty 177

## 2021-07-22 MED ORDER — HYDROCODONE-ACETAMINOPHEN 5-325 MG PO TABS
1.0000 | ORAL_TABLET | ORAL | Status: DC | PRN
  Administered 2021-07-23 – 2021-07-26 (×5): 2 via ORAL
  Filled 2021-07-22 (×5): qty 2

## 2021-07-22 MED ORDER — CHLORHEXIDINE GLUCONATE CLOTH 2 % EX PADS
6.0000 | MEDICATED_PAD | Freq: Every day | CUTANEOUS | Status: DC
  Administered 2021-07-23 – 2021-07-24 (×2): 6 via TOPICAL

## 2021-07-22 MED ORDER — ONDANSETRON HCL 4 MG PO TABS: 4.0000 mg | ORAL_TABLET | Freq: Four times a day (QID) | ORAL | Status: DC | PRN

## 2021-07-22 MED ORDER — ONDANSETRON HCL 4 MG/2ML IJ SOLN: 4.0000 mg | Freq: Once | INTRAMUSCULAR | Status: DC | PRN

## 2021-07-22 MED ORDER — DOCUSATE SODIUM 100 MG PO CAPS
100.0000 mg | ORAL_CAPSULE | Freq: Two times a day (BID) | ORAL | Status: DC
  Administered 2021-07-22 – 2021-07-27 (×10): 100 mg via ORAL
  Filled 2021-07-22 (×10): qty 1

## 2021-07-22 MED ORDER — METOCLOPRAMIDE HCL 5 MG/ML IJ SOLN: 5.0000 mg | Freq: Three times a day (TID) | INTRAMUSCULAR | Status: DC | PRN

## 2021-07-22 MED ORDER — PHENYLEPHRINE HCL-NACL 20-0.9 MG/250ML-% IV SOLN
INTRAVENOUS | Status: DC | PRN
  Administered 2021-07-22: 30 ug/min via INTRAVENOUS

## 2021-07-22 MED ORDER — ACETAMINOPHEN 10 MG/ML IV SOLN: 1000.0000 mg | Freq: Once | INTRAVENOUS | Status: DC | PRN

## 2021-07-22 MED ORDER — TRANEXAMIC ACID-NACL 1000-0.7 MG/100ML-% IV SOLN
1000.0000 mg | Freq: Once | INTRAVENOUS | Status: AC
  Administered 2021-07-22: 1000 mg via INTRAVENOUS
  Filled 2021-07-22: qty 100

## 2021-07-22 MED ORDER — POVIDONE-IODINE 10 % EX SWAB
2.0000 "application " | Freq: Once | CUTANEOUS | Status: AC
  Administered 2021-07-22: 2 via TOPICAL

## 2021-07-22 MED ORDER — METHOCARBAMOL 500 MG IVPB - SIMPLE MED
500.0000 mg | Freq: Four times a day (QID) | INTRAVENOUS | Status: DC | PRN
  Filled 2021-07-22: qty 50

## 2021-07-22 MED ORDER — PROPOFOL 10 MG/ML IV BOLUS
INTRAVENOUS | Status: DC | PRN
  Administered 2021-07-22: 120 mg via INTRAVENOUS

## 2021-07-22 MED ORDER — FLUTICASONE PROPIONATE 50 MCG/ACT NA SUSP
2.0000 | Freq: Every day | NASAL | Status: DC
  Administered 2021-07-23 – 2021-07-27 (×5): 2 via NASAL
  Filled 2021-07-22: qty 16

## 2021-07-22 MED ORDER — METHOCARBAMOL 500 MG PO TABS
500.0000 mg | ORAL_TABLET | Freq: Four times a day (QID) | ORAL | Status: DC | PRN
  Administered 2021-07-23 – 2021-07-26 (×4): 500 mg via ORAL
  Filled 2021-07-22 (×4): qty 1

## 2021-07-22 MED ORDER — HYDROCODONE-ACETAMINOPHEN 7.5-325 MG PO TABS
ORAL_TABLET | ORAL | Status: AC
  Filled 2021-07-22: qty 1

## 2021-07-22 MED ORDER — FENTANYL CITRATE (PF) 100 MCG/2ML IJ SOLN
INTRAMUSCULAR | Status: AC
  Filled 2021-07-22: qty 2

## 2021-07-22 MED ORDER — DIPHENHYDRAMINE HCL 12.5 MG/5ML PO ELIX: 12.5000 mg | ORAL_SOLUTION | ORAL | Status: DC | PRN

## 2021-07-22 MED ORDER — METOCLOPRAMIDE HCL 5 MG PO TABS: 5.0000 mg | ORAL_TABLET | Freq: Three times a day (TID) | ORAL | Status: DC | PRN

## 2021-07-22 MED ORDER — MORPHINE SULFATE (PF) 2 MG/ML IV SOLN: 0.5000 mg | INTRAVENOUS | Status: DC | PRN

## 2021-07-22 MED ORDER — MENTHOL 3 MG MT LOZG: 1.0000 | LOZENGE | OROMUCOSAL | Status: DC | PRN

## 2021-07-22 MED ORDER — ADULT MULTIVITAMIN W/MINERALS CH
1.0000 | ORAL_TABLET | Freq: Every day | ORAL | Status: DC
  Administered 2021-07-23 – 2021-07-27 (×5): 1 via ORAL
  Filled 2021-07-22 (×5): qty 1

## 2021-07-22 MED ORDER — POLYETHYLENE GLYCOL 3350 17 G PO PACK
17.0000 g | PACK | Freq: Every day | ORAL | Status: DC | PRN
  Administered 2021-07-23: 17 g via ORAL
  Filled 2021-07-22: qty 1

## 2021-07-22 SURGICAL SUPPLY — 50 items
BAG COUNTER SPONGE SURGICOUNT (BAG) ×1 IMPLANT
BAG SPEC THK2 15X12 ZIP CLS (MISCELLANEOUS) ×1
BAG SPNG CNTER NS LX DISP (BAG) ×1
BAG ZIPLOCK 12X15 (MISCELLANEOUS) ×2 IMPLANT
BANDAGE ESMARK 6X9 LF (GAUZE/BANDAGES/DRESSINGS) ×1 IMPLANT
BLADE SAW SGTL 11.0X1.19X90.0M (BLADE) IMPLANT
BNDG CMPR 9X6 STRL LF SNTH (GAUZE/BANDAGES/DRESSINGS) ×1
BNDG ELASTIC 6X5.8 VLCR STR LF (GAUZE/BANDAGES/DRESSINGS) ×1 IMPLANT
BNDG ESMARK 6X9 LF (GAUZE/BANDAGES/DRESSINGS) ×2
BNDG GAUZE ELAST 4 BULKY (GAUZE/BANDAGES/DRESSINGS) ×2 IMPLANT
COVER SURGICAL LIGHT HANDLE (MISCELLANEOUS) ×2 IMPLANT
CUFF TOURN SGL QUICK 18X4 (TOURNIQUET CUFF) ×1 IMPLANT
CUFF TOURN SGL QUICK 24 (TOURNIQUET CUFF)
CUFF TOURN SGL QUICK 34 (TOURNIQUET CUFF) ×2
CUFF TRNQT CYL 24X4X16.5-23 (TOURNIQUET CUFF) ×1 IMPLANT
CUFF TRNQT CYL 34X4.125X (TOURNIQUET CUFF) ×1 IMPLANT
DRAIN PENROSE 0.5X18 (DRAIN) ×1 IMPLANT
DRSG PAD ABDOMINAL 8X10 ST (GAUZE/BANDAGES/DRESSINGS) ×3 IMPLANT
DURAPREP 26ML APPLICATOR (WOUND CARE) ×1 IMPLANT
ELECT REM PT RETURN 15FT ADLT (MISCELLANEOUS) ×2 IMPLANT
EVACUATOR 1/8 PVC DRAIN (DRAIN) ×1 IMPLANT
GAUZE SPONGE 4X4 12PLY STRL (GAUZE/BANDAGES/DRESSINGS) ×2 IMPLANT
GAUZE XEROFORM 1X8 LF (GAUZE/BANDAGES/DRESSINGS) ×2 IMPLANT
GLOVE SURG ENC TEXT LTX SZ7 (GLOVE) IMPLANT
GLOVE SURG ORTHO LTX SZ8 (GLOVE) ×2 IMPLANT
GLOVE SURG UNDER LTX SZ6.5 (GLOVE) ×2 IMPLANT
GLOVE SURG UNDER POLY LF SZ7.5 (GLOVE) ×6 IMPLANT
GOWN STRL REUS W/ TWL LRG LVL3 (GOWN DISPOSABLE) ×1 IMPLANT
GOWN STRL REUS W/TWL LRG LVL3 (GOWN DISPOSABLE) ×2
HANDPIECE INTERPULSE COAX TIP (DISPOSABLE) ×2
KIT BASIN OR (CUSTOM PROCEDURE TRAY) ×1 IMPLANT
KIT TURNOVER KIT A (KITS) IMPLANT
MANIFOLD NEPTUNE II (INSTRUMENTS) ×2 IMPLANT
PACK ORTHO EXTREMITY (CUSTOM PROCEDURE TRAY) ×1 IMPLANT
PACK TOTAL KNEE CUSTOM (KITS) ×1 IMPLANT
PAD CAST 4YDX4 CTTN HI CHSV (CAST SUPPLIES) ×1 IMPLANT
PADDING CAST COTTON 4X4 STRL (CAST SUPPLIES) ×2
PROTECTOR NERVE ULNAR (MISCELLANEOUS) ×2 IMPLANT
SET HNDPC FAN SPRY TIP SCT (DISPOSABLE) ×1 IMPLANT
SOL PREP POV-IOD 4OZ 10% (MISCELLANEOUS) ×1 IMPLANT
SOL SCRUB PVP POV-IOD 4OZ 7.5% (MISCELLANEOUS) ×4
SOLUTION PRONTOSAN WOUND 350ML (IRRIGATION / IRRIGATOR) ×1 IMPLANT
SOLUTION SCRB POV-IOD 4OZ 7.5% (MISCELLANEOUS) ×1 IMPLANT
SUT ETHILON 2 0 PS N (SUTURE) ×4 IMPLANT
SUT VIC AB 2-0 CT1 27 (SUTURE) ×4
SUT VIC AB 2-0 CT1 TAPERPNT 27 (SUTURE) IMPLANT
SYR 20ML LL LF (SYRINGE) ×1 IMPLANT
SYR CONTROL 10ML LL (SYRINGE) ×1 IMPLANT
TOWEL OR 17X26 10 PK STRL BLUE (TOWEL DISPOSABLE) ×4 IMPLANT
TUBE SUCTION HIGH CAP CLEAR NV (SUCTIONS) ×1 IMPLANT

## 2021-07-22 NOTE — Consult Note (Signed)
[facility] CM Inpatient Consult ? ? ?07/22/2021 ? ?CINDIA HUSTEAD ?1940/08/20 ?[id number] ? ?[place] [facility] CM) ?  ?Patient is currently active with [place] [facility] CM) for chronic disease management services.  Patient has been engaged by a [facility].   ? ?Plan: Will continue to follow for progression and disposition and update community care manager of patient admission. ? ?Of note, [facility] Care Management services does not replace or interfere with any services that are arranged by inpatient case management or social work.  ? ?Netta Cedars, MSN, RN ?[facility] Liaison ?Toll free office [phone]  ?  ? ?

## 2021-07-22 NOTE — Op Note (Signed)
NAME: Kayla [name], Kayla [name] ?MEDICAL RECORD NO: [id number] ?ACCOUNT NO: [id number] ?DATE OF BIRTH: 02-21-1941 ?FACILITY: [facility] ?LOCATION: [facility] ?PHYSICIAN: Pietro Cassis. Alvan Dame, MD ? ?Operative Report  ? ?DATE OF PROCEDURE: 07/22/2021 ? ?PREOPERATIVE DIAGNOSIS:  Recurrent right prepatellar septic bursitis. ? ?POSTOPERATIVE DIAGNOSIS:  Recurrent right prepatellar septic bursitis. ? ?PROCEDURE:  Excisional and non-excisional debridement of right knee prepatellar space including a combined 5-6 inch incision.  Excision was carried out sharply with a scalpel including skin and nonviable tissue. ? ?SURGEON:  Pietro Cassis. Alvan Dame, MD ? ?ASSISTANT:  Costella Hatcher, PA-C.  Note that, Ms. Kayla [name] was present for the entirety of the case from preoperative positioning, perioperative management of the operative extremity, general facilitation of the case and primary wound closure. ? ?ANESTHESIA:  General. ? ?BLOOD LOSS:  Minimal as a tourniquet was used. ? ?Tourniquet was up per anesthetic record at 225 mmHg. ? ?DRAINS:  One medium Hemovac drain was placed into this space. ? ?INDICATION OF PROCEDURE:  The patient is an 81 year old female who has been a patient of mine over the past several years.  She presented to the office 4 or 5 weeks ago after having a laceration over the anterior aspect of the knee repaired at an urgent  ?care.  She developed prepatellar septic bursitis.  I actually took her to the operating room about 3 weeks ago to I and D this area.  She was being followed in the office on oral antibiotics.  Her wound has healed otherwise. She was actually seen in the  ?office last week and did not have any significant erythema; however, over the weekend, she started to began increasing swelling, pain and erythema.  She was brought to the emergency room after initial evaluation in the urgent care after she developed a  ?wound that began to drain.  She was admitted to the hospitalist service.  I was consulted for management.  We discussed  the necessity of returning to the operating room to manage this for multiple reasons.  Preoperative radiograph indicated soft tissue  ?swelling in the prepatellar space, but no evidence of joint effusion.  On examination, there was no palpable effusion, but did have significant purulent drainage from the medial aspect of the transverse laceration incision that was repaired. ? ?DESCRIPTION OF PROCEDURE:  The patient was brought to the operative theater.  Once adequate anesthesia, preoperative antibiotics which were not administered directly other than the fact that she has been placed on Rocephin and vancomycin since her  ?admission on 07/21/2021.  The right lower extremity was prepped and draped in sterile fashion using Betadine scrub and paint.  A timeout was performed identifying the patient, planned procedure, and extremity.  The right lower extremity was exsanguinated ? and tourniquet elevated to 225 mmHg.  At this point, I used actually the incision that I had used previously for the I and D, extended this to the level of the transverse laceration that was repaired and then made a sharp angled cut medial to  ?incorporate the open wound.  I did this in order to create a mild flap that I can repair back.  Once I opened up her skin and debrided the skin sharply with a knife including the open wound area, all the purulent fluid was evacuated from her knee.  We  ?then irrigated the knee with 3 liters of normal saline solution.  I was unable to palpate any effusion and thus I did not feel it was necessary to aspirate her joint and raise  the concern for seeding the joint through a capsular penetration.  Once we  ?irrigated with 3 liters of normal saline solution, I used about 200 mL of the Prontosan for further sterilization.  At this point, I did place a medium Hemovac drain deep, extending up the proximal lateral knee.  Following placement of the medium Hemovac ? drain, the wound was closed with a combination of 2-0  Vicryl and 2-0 nylon sutures.  The wound was then cleaned, dried and dressed sterilely using Xeroform and a bulky dressing.  The tourniquet was let down after the dressing was applied. She was then  ?awoken from anesthesia and brought to the recovery room in stable condition.  Postoperatively, she will receive IV antibiotics per the medical team and possible infectious disease consultation.  She may necessitate the use of a PICC line if further  ?antibiotics needed to be on the hospital stay. ? ? ?VAI ?D: 07/22/2021 4:14:21 pm T: 07/22/2021 10:06:00 pm  ?JOB: [id number]/ [id number]  ?

## 2021-07-22 NOTE — Progress Notes (Signed)
Right lower extremity venous duplex has been completed. ?Preliminary results can be found in CV Proc through chart review.  ? ?07/22/21 11:21 AM ?Carlos Levering RVT   ?

## 2021-07-22 NOTE — Progress Notes (Signed)
Spoke with Pharmacy today at [phone]- Pharmacist aware that this PT has been ordered [place] (on home med list). Pharmacy states that [facility] does not carry this medication and PT will have to provide from home. RT informed Pharmacist that this med is showing on my Worklist but not my MAR- Pharmacist states that it shows on the "Active MAR" at 1000- Pharmacist aware that "Active MAR" is not available for RT's. ?

## 2021-07-22 NOTE — Interval H&P Note (Signed)
History and Physical Interval Note: ? ?07/22/2021 ?2:17 PM ? ?Kayla [name]  has presented today for surgery, with the diagnosis of RIGHT PREPATELLAR BURCITIS VS. SEPTIC RIGHT KNEE.  The various methods of treatment have been discussed with the patient and family. After consideration of risks, benefits and other options for treatment, the patient has consented to  Procedure(s): ?[place] (Right) as a surgical intervention.  The patient's history has been reviewed, patient examined, no change in status, stable for surgery.  I have reviewed the patient's chart and labs.  Questions were answered to the patient's satisfaction.   ? ? ?Mauri Pole ? ? ?

## 2021-07-22 NOTE — Hospital Course (Addendum)
Kayla [name] is an 81 y.o. female with a history of CHF, CKD stage 3b, COPD, chronic pain, CAD, diabetes mellitus on insulin, hypertension, paroxysmal atrial fibrillation. Patient presented secondary to worsening knee pain and erythema and found to have evidence of recurrent septic prepatellar bursitis. Empiric antibiotics initiated and orthopedic surgery consulted.  Patient underwent excisional l and non excisional debridement of right knee prepatellar space including a combined 5-6 inch incision, Hemovac drain was placed by Dr. Alvan Dame on 3/21. Hemovac removed.  ? ?ID consulted, recommend 6 weeks of IV antibiotics. Culture growing staph, awaiting sensitivity.  ? ?She develops worsening hyperglycemia 3/22. Transfer to Step down unit for insulin gtt. Transition to long acting insulin 3/23. ? ?Patient underwent tunnel IJ for home IV antibiotics placement 3/25. ? ?Patient was confuse overnight, try to remove central line. No further confusion, she is stable. Plan for discharge today.  ?

## 2021-07-22 NOTE — Anesthesia Postprocedure Evaluation (Signed)
Anesthesia Post Note ? ?Patient: Kayla [name] ? ?Procedure(s) Performed: INCISION AND DRAINAGE ABSCESS RIGHT KNEE (Right) ? ?  ? ?Patient location during evaluation: PACU ?Anesthesia Type: General ?Level of consciousness: awake and alert, oriented and patient cooperative ?Pain management: pain level controlled (pain improved, pt says she feel much better) ?Vital Signs Assessment: post-procedure vital signs reviewed and stable ?Respiratory status: spontaneous breathing, nonlabored ventilation, respiratory function stable and patient connected to nasal cannula oxygen ?Cardiovascular status: blood pressure returned to baseline and stable ?Postop Assessment: no apparent nausea or vomiting ?Anesthetic complications: no ? ? ?No notable events documented. ? ?Last Vitals:  ?Vitals:  ? 07/22/21 1630 07/22/21 1645  ?BP: 107/77 (!) 152/55  ?Pulse: 62 62  ?Resp: (!) 28 (!) 25  ?Temp:  36.6 ?C  ?SpO2: 99% 95%  ?  ?Last Pain:  ?Vitals:  ? 07/22/21 1624  ?TempSrc:   ?PainSc: 10-Worst pain ever  ? ? ?  ?  ?  ?  ?  ?  ? ?Kahli Mayon,E. Vaughn Frieze ? ? ? ? ?

## 2021-07-22 NOTE — H&P (View-Only) (Signed)
Reason for Consult: septic right pre-patellar bursitis ?Referring Physician: Lonny Prude, MD ? ?Kayla [name] is an 81 y.o. female.  ?HPI: Kayla [name] is a 81 y.o. female with medical history significant of CHF, CKD stage 3b, COPD, chronic pain, CAD, diabetes mellitus on insulin, hypertension, paroxysmal atrial fibrillation. Right knee pain that started three days ago with progressive worsening. This morning, pain was bad enough that she decided to see her orthopedic surgeon, unfortunately he was not available; she was seen in their urgent care and was sent to the emergency department. Patient reports taking hydrocodone and Tylenol but it was not helping much with her pain. She reports subjective fever, chills, nausea. No emesis. No diarrhea. Patient reports taking her morning medications ? ? ? ?Past Medical History:  ?Diagnosis Date  ? Allergy   ? Anxiety   ? Arthritis   ? CAD (coronary artery disease)   ? Mild per remote cath in 2006, /    Nuclear, January, 2013, no significant abnormality,  ? Carotid artery disease ([place])   ? Doppler, January, 2013, 0 72% RIC A., 62-03% LICA, stable  ? Cataract   ? CHF (congestive heart failure) ([place])   ? Chronic kidney disease   ? Complication of anesthesia   ? wakes up "mean"  ? COPD 02/16/2007  ? Intolerant of Spiriva, tudorza Intolerant of Trelegy   ? COPD (chronic obstructive pulmonary disease) ([place])   ? Coronary atherosclerosis 11/24/2008  ? Catheterization 2006, nonobstructive coronary disease   //   nuclear 2013, low risk    ? Diabetes mellitus, type 2 ([place])   ? Diverticula of colon   ? DIVERTICULOSIS OF COLON 03/23/2007  ? Qualifier: Diagnosis of  By: Halford Chessman MD, Vineet    ? Dizziness   ? occasional  ? Dizzy spells 05/04/2011  ? DM (diabetes mellitus) ([place]) 03/23/2007  ? Qualifier: Diagnosis of  By: Halford Chessman MD, Vineet    ? Dyspnea   ? On exertion  ? Ejection fraction   ? Essential hypertension 02/16/2007  ? Qualifier: Diagnosis of  By: Rosana Hoes CMA, Tammy    ? G E R D  03/23/2007  ? Qualifier: Diagnosis of  By: Halford Chessman MD, Vineet    ? Gall bladder disease 04/28/2016  ? Gastritis and gastroduodenitis   ? GERD (gastroesophageal reflux disease)   ? Goiter   ? hx of  ? Headache(784.0)   ? migraine  ? Hyperlipidemia   ? Hypertension   ? Hypothyroidism   ? HYPOTHYROIDISM, POSTSURGICAL 06/04/2008  ? Qualifier: Diagnosis of  By: Loanne Drilling MD, Jacelyn Pi   ? Left ventricular hypertrophy   ? Leg edema   ? occasional swelling  ? Leg swelling 12/09/2018  ? Myofascial pain syndrome, cervical 01/06/2018  ? Obesity   ? OBESITY 11/24/2008  ? Qualifier: Diagnosis of  By: Sidney Ace    ? OBSTRUCTIVE SLEEP APNEA 02/16/2007  ? Auto CPAP 09/03/13 to 10/02/13 >> used on 30 of 30 nights with average 6 hrs and 3 min.  Average AHI is 3.1 with median CPAP 5 cm H2O and 95 th percentile CPAP 6 cm H2O.    ? Occipital neuralgia of right side 01/06/2018  ? OSA (obstructive sleep apnea)   ? cpap setting of 12  ? Pain in joint of right hip 01/21/2018  ? Palpitations 01/21/2011  ? 48 hour Holter, 2015, scattered PACs and PVCs.   ? Paroxysmal atrial fibrillation (HCC)   ? Pneumonia   ? PONV (postoperative nausea and vomiting)   ?  severe after every surgery  ? Pulmonary hypertension, secondary   ? RHINITIS 07/21/2010  ?     ? RUQ abdominal pain   ? S/P left TKA 07/27/2011  ? Sinus node dysfunction ([place]) 11/08/2015  ? Sleep apnea   ? Spinal stenosis of cervical region 11/07/2014  ? Urinary frequency 12/09/2018  ? UTI (urinary tract infection) 08/15/2019  ? per pt report  ? VENTRICULAR HYPERTROPHY, LEFT 11/24/2008  ? Qualifier: Diagnosis of  By: Sidney Ace    ? ? ?Past Surgical History:  ?Procedure Laterality Date  ? ABDOMINAL HYSTERECTOMY    ? with appendectomy and colon polypectomy  ? AMPUTATION TOE Left 12/15/2019  ? Procedure: AMPUTATION  LEFT GREAT TOE;  Surgeon: Evelina Bucy, DPM;  Location: [facility];  Service: Podiatry;  Laterality: Left;  ? ANTERIOR CERVICAL DECOMP/DISCECTOMY FUSION N/A 11/07/2014  ? Procedure:  Anterior Cervical diskectomy and fusion Cervical four-five, Cervical five-six, Cervical six-seven;  Surgeon: Kary Kos, MD;  Location: [facility];  Service: Neurosurgery;  Laterality: N/A;  ? APPENDECTOMY    ? ARTERY BIOPSY Right 02/16/2017  ? Procedure: BIOPSY TEMPORAL ARTERY;  Surgeon: Katha Cabal, MD;  Location: [facility];  Service: Vascular;  Laterality: Right;  ? ARTERY BIOPSY Left 04/16/2020  ? Procedure: BIOPSY TEMPORAL ARTERY;  Surgeon: Katha Cabal, MD;  Location: [facility];  Service: Vascular;  Laterality: Left;  LEFT TEMPLE  ? BACK SURGERY    ? CARDIAC CATHETERIZATION    ? CATARACT EXTRACTION W/ INTRAOCULAR LENS  IMPLANT, BILATERAL Bilateral   ? CHOLECYSTECTOMY N/A 04/29/2016  ? Procedure: LAPAROSCOPIC CHOLECYSTECTOMY WITH INTRAOPERATIVE CHOLANGIOGRAM;  Surgeon: Alphonsa Overall, MD;  Location: [facility];  Service: General;  Laterality: N/A;  ? EP IMPLANTABLE DEVICE N/A 11/08/2015  ? MRI COMPATABLE -- Procedure: Pacemaker Implant;  Surgeon: Deboraha Sprang, MD;  Location: [facility];  Service: Cardiovascular;  Laterality: N/A;  ? INCISION AND DRAINAGE ABSCESS Right 05/30/2021  ? Procedure: IRRIGATION AND DEBRIDEMENT OF PREPATELLA BURSA;  Surgeon: Paralee Cancel, MD;  Location: [facility];  Service: Orthopedics;  Laterality: Right;  60 ?wants standard knee draping and pulse lavage  ? INSERT / REPLACE / REMOVE PACEMAKER  2017  ? Dr. Jens Som [facility])  ? JOINT REPLACEMENT    ? LUMBAR DISC SURGERY  1990's  ? x 2  ? THYROIDECTOMY  2011  ? TOTAL KNEE ARTHROPLASTY  07/27/2011  ? Procedure: TOTAL KNEE ARTHROPLASTY;  Surgeon: Mauri Pole, MD;  [facility]: [facility];  Service: Orthopedics;  Laterality: Left;  ? UPPER ESOPHAGEAL ENDOSCOPIC ULTRASOUND (EUS) N/A 05/14/2016  ? Procedure: UPPER ESOPHAGEAL ENDOSCOPIC ULTRASOUND (EUS);  Surgeon: Milus Banister, MD;  Location: [facility];  Service: Endoscopy;  Laterality: N/A;  radial only-will be done mod if needed  ? ? ?Family History  ?Problem Relation Age of Onset   ? Heart Problems Mother   ? Heart defect Mother   ?     valve problems  ? Heart Problems Father   ? Heart disease Brother   ? Lung cancer Daughter   ?     non small cell   ? Diabetes Maternal Aunt   ? Diabetes Maternal Uncle   ? Throat cancer Brother   ? Colon cancer Neg Hx   ? Colon polyps Neg Hx   ? Esophageal cancer Neg Hx   ? Rectal cancer Neg Hx   ? Stomach cancer Neg Hx   ? ? ?Social History:  reports that she quit smoking about 24  years ago. Her smoking use included cigarettes. She started smoking about 57 years ago. She has a 66.00 pack-year smoking history. She has never used smokeless tobacco. She reports that she does not drink alcohol and does not use drugs. ? ?Allergies:  ?Allergies  ?Allergen Reactions  ? Dulaglutide Nausea Only, Other (See Comments) and Cough  ?  Made the patient sick to her stomach (only) several days after using it  ? Oxycodone Itching and Other (See Comments)  ?  Pt still takes this med even thought it causes itching  ? Trelegy Ellipta [Fluticasone-Umeclidin-Vilant] Nausea Only, Other (See Comments) and Cough  ?  Made the patient sick to her stomach (only) several days after using it   ? ? ?Medications: I have reviewed the patient's current medications. ?Scheduled: ? allopurinol  100 mg Oral BID  ? amiodarone  200 mg Oral QHS  ? amLODipine  5 mg Oral Daily  ? atorvastatin  40 mg Oral Daily  ? Budeson-Glycopyrrol-Formoterol  2 puff Inhalation BID  ? Chlorhexidine Gluconate Cloth  6 each Topical Daily  ? fluticasone  2 spray Each Nare Daily  ? gabapentin  300 mg Oral TID  ? insulin aspart  0-15 Units Subcutaneous Q4H  ? levothyroxine  200 mcg Oral QAC breakfast  ? losartan  50 mg Oral Daily  ? metoprolol succinate  100 mg Oral Daily  ? mupirocin ointment  1 application. Nasal BID  ? torsemide  80 mg Oral Daily  ? ? ?Results for orders placed or performed during the hospital encounter of 07/21/21 (from the past 24 hour(s))  ?CBC with Differential     Status: Abnormal  ? Collection  Time: 07/21/21  2:21 PM  ?Result Value Ref Range  ? WBC 27.8 (H) 4.0 - 10.5 K/uL  ? RBC 3.77 (L) 3.87 - 5.11 MIL/uL  ? Hemoglobin 10.8 (L) 12.0 - 15.0 g/dL  ? HCT 35.0 (L) 36.0 - 46.0 %  ? MCV 92.8 80.0 - 100.0 f

## 2021-07-22 NOTE — Progress Notes (Signed)
Initial Nutrition Assessment ? ?DOCUMENTATION CODES:  ? ?Obesity unspecified ? ?INTERVENTION:  ?- Once diet resumes, encourage adequate PO intake ?- Boost Breeze po TID, each supplement provides 250 kcal and 9 grams of protein ?- MVI with minerals daily ? ?NUTRITION DIAGNOSIS:  ? ?Increased nutrient needs related to acute illness, post-op healing as evidenced by estimated needs. ? ?GOAL:  ? ?Patient will meet greater than or equal to 90% of their needs ? ?MONITOR:  ? ?PO intake, Supplement acceptance, Labs, Weight trends, Skin ? ?REASON FOR ASSESSMENT:  ? ?Malnutrition Screening Tool ?  ? ?ASSESSMENT:  ? ?Pt admitted from home with knee pain secondary to septic prepatellar bursitis of R knee. PMH significant for CHF, CKD stage 3b, COPD, chronic pain, CAD, T2DM on insulin, hypertension and afib. ? ?Plans for I&D of R knee today.  ? ?Pt reports at home she was eating 3 meals a day with some snacks. However recently d/t nausea and feeling sick with her knee pain, she has been eating about half the amount of her baseline PO intake. She typically has bacon, eggs, wheat toast and coffee for breakfast. For lunch she has a sandwich and for dinner they generally eat out and have fish or sandwiches. And a sandwich before bedtime. ? ?She checks her blood sugar daily and states that her morning blood sugar is usually 115-120 but recently has been having hypoglycemic episodes in the morning of ~60 which she corrects with OJ or candy. Pt also states that she has been on prednisone lately which has caused an increase in her daily blood sugars.  ? ?She is agreeable to continuing to receive Boost Breeze while on a clear liquid diet and Ensure strawberry once diet advanced to regular. ? ?Pt states that her usual weight is 262 lbs and endorses weight loss with a current weight of 231 lbs. Per review of chart, pt weight has been noted to decline since August 2022. Noted an 11% weight loss from 12/09/20-07/21/21. ? ?Medications: SSI,  synthroid, torsemide, IV abx ? ?Labs: sodium 134, BUN 45, Cr 1.77, Calcium 7.8,  ? HgbA1c 8.4% (05/30/21), CBG's 69-185 x12 hours ? ?NUTRITION - FOCUSED PHYSICAL EXAM: ? ?Flowsheet Row Most Recent Value  ?Orbital Region No depletion  ?Upper Arm Region No depletion  ?Thoracic and Lumbar Region No depletion  ?Buccal Region No depletion  ?Temple Region No depletion  ?Clavicle Bone Region No depletion  ?Clavicle and Acromion Bone Region No depletion  ?Scapular Bone Region No depletion  ?Dorsal Hand No depletion  ?Patellar Region Mild depletion  ?Anterior Thigh Region Mild depletion  ?Posterior Calf Region Mild depletion  [unable to assess R leg d/t pain and swelling]  ?Edema (RD Assessment) None  ?Hair Reviewed  ?Eyes Reviewed  ?Mouth Reviewed  ?Skin Reviewed  ?Nails Reviewed  ? ?  ? ? ?Diet Order:   ?Diet Order   ? ?       ?  Diet clear liquid Room service appropriate? Yes; Fluid consistency: Thin  Diet effective now       ?  ? ?  ?  ? ?  ? ? ?EDUCATION NEEDS:  ? ?Education needs have been addressed ? ?Skin:  Skin Assessment: Skin Integrity Issues: ?Skin Integrity Issues:: Other (Comment), Incisions ?Incisions: R knee (closed) ?Other: skin tear R knee ? ?Last BM:  3/19 ? ?Height:  ? ?Ht Readings from Last 1 Encounters:  ?07/21/21 '5\' 5"'$  (1.651 m)  ? ? ?Weight:  ? ?Wt Readings from Last 1 Encounters:  ?  07/21/21 104.3 kg  ? ? ?Ideal Body Weight:  56.8 kg ? ?BMI:  Body mass index is 38.27 kg/m?. ? ?Estimated Nutritional Needs:  ? ?Kcal:  1700-1900 ? ?Protein:  85-100g ? ?Fluid:  >/=1.7L ? ?Clayborne Dana, RDN, LDN ?Clinical Nutrition ?

## 2021-07-22 NOTE — Progress Notes (Signed)
? ?PROGRESS NOTE ? ? ? ?Kayla [name]  EXB:[id number] DOB: 1940-08-01 DOA: 07/21/2021 ?PCP: [name], MD ? ? ?Brief Narrative: ?Kayla [name] is an 81 y.o. female with a history of CHF, CKD stage 3b, COPD, chronic pain, CAD, diabetes mellitus on insulin, hypertension, paroxysmal atrial fibrillation. Patient presented secondary to worsening knee pain and erythema and found to have evidence of recurrent septic prepatellar bursitis. Empiric antibiotics initiated and orthopedic surgery consulted with plans for I&D. ? ? ?Assessment and Plan: ?* Septic prepatellar bursitis of right knee ?Recurrent. Associated leukocytosis with WBC of 27,800. Orthopedic surgery consulted with plan for possible I&D. Patient started empirically on Vancomycin and Ceftriaxone. Blood cultures obtained on admission. Spontaneous brown drainage from bursa this morning. WBC trended down today. ?-Continue Vancomycin ?-Follow-up blood cultures ?-Follow-up orthopedic surgery recommendations: plan for I&D today ? ?Paroxysmal atrial fibrillation (HCC) ?Patient is on Eliquis, amiodarone and Toprol XL as an outpatient. ?-Continue amiodarone and Toprol XL ?-Hold Eliquis pending orthopedic surgery recommendations ? ?Hypertension associated with diabetes ([employer]) ?Patient is on losartan, Toprol XL and amlodipine as an outpatient. ?-Continue home losartan, Toprol XL and amlodipine ? ?Insulin dependent type 2 diabetes mellitus ([place]) ?Hemoglobin A1C of 8.4% from two months back. Patient is on Lantus 110 units daily and Novolog sliding scale as an outpatient. Hypoglycemia while in the ED with blood sugar of 53. ?-Hold long acting ?-SSI q4 hours while NPO ? ?Chronic kidney disease, stage 3b ([place]) ?Baseline creatinine of about 1.5. Slightly elevated today. ?-BMP in AM ? ?Pacemaker - MDT ?Noted. ? ?Chronic diastolic heart failure ([place]) ?Euvolemic. Patient is on torsemide as an outpatient. ?-Continue torsemide ? ?COPD (chronic obstructive pulmonary  disease) ([place]) ?Patient is on albuterol and Breztri as an outpatient. Wheezing. ?-Switch to albuterol TID for now ?-Continue Breztri ? ?OBSTRUCTIVE SLEEP APNEA ?-Continue CPAP ? ?OBESITY ?Body mass index is 38.27 kg/m?. ? ?Hyperlipidemia associated with type 2 diabetes mellitus ([place]) ?-Continue Lipitor ? ?HYPOTHYROIDISM, POSTSURGICAL ?-Continue Synthroid 200 mcg ? ? ? ? ?DVT prophylaxis: SCDs (left leg); eventual resumption of Eliquis ?Code Status:   Code Status: DNR ?Family Communication: None at bedside ?Disposition Plan: Discharge home likely in 2-3 days pending orthopedic surgery recommendations, ability to transition to oral antibiotics ? ? ?Consultants:  ?Orthopedic surgery ? ?Procedures:  ?None ? ?Antimicrobials: ?Vancomycin ?Ceftriaxone  ? ? ?Subjective: ?Patient reports drainage of her right knee earlier. Some shortness of breath overnight. ? ?Objective: ?BP 140/76 (BP Location: Right Arm)   Pulse 61   Temp 99.6 ?F (37.6 ?C) (Oral)   Resp 16   Ht '5\' 5"'$  (1.651 m)   Wt 104.3 kg   SpO2 94%   BMI 38.27 kg/m?  ? ?Examination: ? ?General exam: Appears calm and comfortable ?Respiratory system: Diffuse wheezing. Respiratory effort normal. ?Cardiovascular system: S1 & S2 heard, RRR. ?Gastrointestinal system: Abdomen is nondistended, soft and nontender. No organomegaly or masses felt. Normal bowel sounds heard. ?Central nervous system: Alert and oriented. No focal neurological deficits. ?Musculoskeletal: Significant right LE edema compared to left ?Skin: No cyanosis. Erythema of right knee/lower leg. Right knee with significant effusion/swelling ?Psychiatry: Judgement and insight appear normal. Mood & affect appropriate.  ? ? ?Data Reviewed: I have personally reviewed following labs and imaging studies ? ?CBC ?Lab Results  ?Component Value Date  ? WBC 20.8 (H) 07/22/2021  ? RBC 3.20 (L) 07/22/2021  ? HGB 9.4 (L) 07/22/2021  ? HCT 30.3 (L) 07/22/2021  ? MCV 94.7 07/22/2021  ? MCH 29.4 07/22/2021  ?  PLT 377  07/22/2021  ? MCHC 31.0 07/22/2021  ? RDW 16.3 (H) 07/22/2021  ? LYMPHSABS 2.3 07/21/2021  ? MONOABS 2.1 (H) 07/21/2021  ? EOSABS 0.1 07/21/2021  ? BASOSABS 0.1 07/21/2021  ? ? ? ?Last metabolic panel ?Lab Results  ?Component Value Date  ? NA 134 (L) 07/22/2021  ? K 3.7 07/22/2021  ? CL 101 07/22/2021  ? CO2 22 07/22/2021  ? BUN 45 (H) 07/22/2021  ? CREATININE 1.77 (H) 07/22/2021  ? GLUCOSE 110 (H) 07/22/2021  ? GFRNONAA 29 (L) 07/22/2021  ? GFRAA 45 (L) 02/16/2020  ? CALCIUM 7.8 (L) 07/22/2021  ? PROT 6.5 01/30/2021  ? ALBUMIN 2.9 (L) 01/30/2021  ? LABGLOB 2.5 01/15/2021  ? AGRATIO 1.5 01/15/2021  ? BILITOT 0.5 01/30/2021  ? ALKPHOS 76 01/30/2021  ? AST 17 01/30/2021  ? ALT 15 01/30/2021  ? ANIONGAP 11 07/22/2021  ? ? ?GFR: ?Estimated Creatinine Clearance: 30.4 mL/min (A) (by C-G formula based on SCr of 1.77 mg/dL (H)). ? ?Recent Results (from the past 240 hour(s))  ?Culture, blood (routine x 2)     Status: None (Preliminary result)  ? Collection Time: 07/21/21  3:40 PM  ? Specimen: BLOOD  ?Result Value Ref Range Status  ? Specimen Description   Final  ?  BLOOD LEFT ANTECUBITAL ?Performed at [facility], [place] [address], [place], [place] [address] ?  ? Special Requests   Final  ?  BOTTLES DRAWN AEROBIC AND ANAEROBIC Blood Culture results may not be optimal due to an excessive volume of blood received in culture bottles ?Performed at [facility], [place] [address] [place], [place] [address] ?  ? Culture   Final  ?  NO GROWTH < 12 HOURS ?Performed at [facility], [place] [address], [place], [place] [address] ?  ? Report Status PENDING  Incomplete  ?Culture, blood (routine x 2)     Status: None (Preliminary result)  ? Collection Time: 07/21/21  3:45 PM  ? Specimen: BLOOD  ?Result Value Ref Range Status  ? Specimen Description   Final  ?  BLOOD RIGHT ANTECUBITAL ?Performed at [facility], [place] [address], [place], [place] [address] ?  ? Special Requests    Final  ?  BOTTLES DRAWN AEROBIC AND ANAEROBIC Blood Culture results may not be optimal due to an excessive volume of blood received in culture bottles ?Performed at [facility], [place] [address] [place], [place] [address] ?  ? Culture   Final  ?  NO GROWTH < 12 HOURS ?Performed at [facility], [place] [address] [place], [place] [address] ?  ? Report Status PENDING  Incomplete  ?Surgical PCR screen     Status: Abnormal  ? Collection Time: 07/22/21 12:11 AM  ? Specimen: Nasal Mucosa; Nasal Swab  ?Result Value Ref Range Status  ? MRSA, PCR POSITIVE (A) NEGATIVE Final  ? Staphylococcus aureus POSITIVE (A) NEGATIVE Final  ?  Comment: (NOTE) ?The Xpert SA Assay (FDA approved for NASAL specimens in patients 74 ?years of age and older), is one component of a comprehensive ?surveillance program. It is not intended to diagnose infection nor to ?guide or monitor treatment. ?Performed at [facility], [place] [address] ?[place],  [address] ?  ?  ? ? ?Radiology Studies: ?DG Chest 2 View ? ?Result Date: 07/21/2021 ?CLINICAL DATA:  Right knee pain with infection. EXAM: CHEST - 2 VIEW COMPARISON:  03/18/2021 and CT chest  11/14/2020. FINDINGS: Image quality is degraded by body habitus and apical lordotic technique. Right atrial pacemaker lead tip is visualized. The other pacemaker lead tip is poorly visualized on the frontal view for reasons as stated. Bibasilar scarring. Right hemidiaphragm appears elevated. IMPRESSION: 1. Suboptimal examination due to body habitus and apical lordotic technique. 2. Bibasilar scarring. Electronically Signed   By: Lorin Picket M.D.   On: 07/21/2021 16:26  ? ?DG Knee Complete 4 Views Right ? ?Result Date: 07/21/2021 ?CLINICAL DATA:  Swelling, erythema and heat sensation. EXAM: RIGHT KNEE - COMPLETE 4+ VIEW COMPARISON:  01/14/2021 FINDINGS: No evidence of joint effusion. There is tricompartmental osteoarthritis. Severe prepatellar soft tissue swelling  consistent with prepatellar bursitis. Infected bursitis not excluded. Regional arterial calcification incidentally noted. IMPRESSION: Pronounced prepatellar soft tissue swelling consistent with prepatellar bursitis. Infected

## 2021-07-22 NOTE — Consult Note (Signed)
Reason for Consult: septic right pre-patellar bursitis ?Referring Physician: Lonny Prude, MD ? ?Kayla [name] is an 81 y.o. female.  ?HPI: Kayla [name] is a 81 y.o. female with medical history significant of CHF, CKD stage 3b, COPD, chronic pain, CAD, diabetes mellitus on insulin, hypertension, paroxysmal atrial fibrillation. Right knee pain that started three days ago with progressive worsening. This morning, pain was bad enough that she decided to see her orthopedic surgeon, unfortunately he was not available; she was seen in their urgent care and was sent to the emergency department. Patient reports taking hydrocodone and Tylenol but it was not helping much with her pain. She reports subjective fever, chills, nausea. No emesis. No diarrhea. Patient reports taking her morning medications ? ? ? ?Past Medical History:  ?Diagnosis Date  ? Allergy   ? Anxiety   ? Arthritis   ? CAD (coronary artery disease)   ? Mild per remote cath in 2006, /    Nuclear, January, 2013, no significant abnormality,  ? Carotid artery disease ([place])   ? Doppler, January, 2013, 0 51% RIC A., 76-16% LICA, stable  ? Cataract   ? CHF (congestive heart failure) ([place])   ? Chronic kidney disease   ? Complication of anesthesia   ? wakes up "mean"  ? COPD 02/16/2007  ? Intolerant of Spiriva, tudorza Intolerant of Trelegy   ? COPD (chronic obstructive pulmonary disease) ([place])   ? Coronary atherosclerosis 11/24/2008  ? Catheterization 2006, nonobstructive coronary disease   //   nuclear 2013, low risk    ? Diabetes mellitus, type 2 ([place])   ? Diverticula of colon   ? DIVERTICULOSIS OF COLON 03/23/2007  ? Qualifier: Diagnosis of  By: Halford Chessman MD, Vineet    ? Dizziness   ? occasional  ? Dizzy spells 05/04/2011  ? DM (diabetes mellitus) ([place]) 03/23/2007  ? Qualifier: Diagnosis of  By: Halford Chessman MD, Vineet    ? Dyspnea   ? On exertion  ? Ejection fraction   ? Essential hypertension 02/16/2007  ? Qualifier: Diagnosis of  By: Rosana Hoes CMA, Tammy    ? G E R D  03/23/2007  ? Qualifier: Diagnosis of  By: Halford Chessman MD, Vineet    ? Gall bladder disease 04/28/2016  ? Gastritis and gastroduodenitis   ? GERD (gastroesophageal reflux disease)   ? Goiter   ? hx of  ? Headache(784.0)   ? migraine  ? Hyperlipidemia   ? Hypertension   ? Hypothyroidism   ? HYPOTHYROIDISM, POSTSURGICAL 06/04/2008  ? Qualifier: Diagnosis of  By: Loanne Drilling MD, Jacelyn Pi   ? Left ventricular hypertrophy   ? Leg edema   ? occasional swelling  ? Leg swelling 12/09/2018  ? Myofascial pain syndrome, cervical 01/06/2018  ? Obesity   ? OBESITY 11/24/2008  ? Qualifier: Diagnosis of  By: Sidney Ace    ? OBSTRUCTIVE SLEEP APNEA 02/16/2007  ? Auto CPAP 09/03/13 to 10/02/13 >> used on 30 of 30 nights with average 6 hrs and 3 min.  Average AHI is 3.1 with median CPAP 5 cm H2O and 95 th percentile CPAP 6 cm H2O.    ? Occipital neuralgia of right side 01/06/2018  ? OSA (obstructive sleep apnea)   ? cpap setting of 12  ? Pain in joint of right hip 01/21/2018  ? Palpitations 01/21/2011  ? 48 hour Holter, 2015, scattered PACs and PVCs.   ? Paroxysmal atrial fibrillation ([facility])   ? Pneumonia   ? PONV (postoperative nausea and vomiting)   ?  severe after every surgery  ? Pulmonary hypertension, secondary   ? RHINITIS 07/21/2010  ?     ? RUQ abdominal pain   ? S/P left TKA 07/27/2011  ? Sinus node dysfunction ([place]) 11/08/2015  ? Sleep apnea   ? Spinal stenosis of cervical region 11/07/2014  ? Urinary frequency 12/09/2018  ? UTI (urinary tract infection) 08/15/2019  ? per pt report  ? VENTRICULAR HYPERTROPHY, LEFT 11/24/2008  ? Qualifier: Diagnosis of  By: Sidney Ace    ? ? ?Past Surgical History:  ?Procedure Laterality Date  ? ABDOMINAL HYSTERECTOMY    ? with appendectomy and colon polypectomy  ? AMPUTATION TOE Left 12/15/2019  ? Procedure: AMPUTATION  LEFT GREAT TOE;  Surgeon: Evelina Bucy, DPM;  Location: [facility];  Service: Podiatry;  Laterality: Left;  ? ANTERIOR CERVICAL DECOMP/DISCECTOMY FUSION N/A 11/07/2014  ? Procedure:  Anterior Cervical diskectomy and fusion Cervical four-five, Cervical five-six, Cervical six-seven;  Surgeon: Kary Kos, MD;  Location: [facility];  Service: Neurosurgery;  Laterality: N/A;  ? APPENDECTOMY    ? ARTERY BIOPSY Right 02/16/2017  ? Procedure: BIOPSY TEMPORAL ARTERY;  Surgeon: Katha Cabal, MD;  Location: [facility];  Service: Vascular;  Laterality: Right;  ? ARTERY BIOPSY Left 04/16/2020  ? Procedure: BIOPSY TEMPORAL ARTERY;  Surgeon: Katha Cabal, MD;  Location: [facility];  Service: Vascular;  Laterality: Left;  LEFT TEMPLE  ? BACK SURGERY    ? CARDIAC CATHETERIZATION    ? CATARACT EXTRACTION W/ INTRAOCULAR LENS  IMPLANT, BILATERAL Bilateral   ? CHOLECYSTECTOMY N/A 04/29/2016  ? Procedure: LAPAROSCOPIC CHOLECYSTECTOMY WITH INTRAOPERATIVE CHOLANGIOGRAM;  Surgeon: Alphonsa Overall, MD;  Location: [facility];  Service: General;  Laterality: N/A;  ? EP IMPLANTABLE DEVICE N/A 11/08/2015  ? MRI COMPATABLE -- Procedure: Pacemaker Implant;  Surgeon: Deboraha Sprang, MD;  Location: [facility];  Service: Cardiovascular;  Laterality: N/A;  ? INCISION AND DRAINAGE ABSCESS Right 05/30/2021  ? Procedure: IRRIGATION AND DEBRIDEMENT OF PREPATELLA BURSA;  Surgeon: Paralee Cancel, MD;  Location: [facility];  Service: Orthopedics;  Laterality: Right;  60 ?wants standard knee draping and pulse lavage  ? INSERT / REPLACE / REMOVE PACEMAKER  2017  ? Dr. Jens Som [facility])  ? JOINT REPLACEMENT    ? LUMBAR DISC SURGERY  1990's  ? x 2  ? THYROIDECTOMY  2011  ? TOTAL KNEE ARTHROPLASTY  07/27/2011  ? Procedure: TOTAL KNEE ARTHROPLASTY;  Surgeon: Mauri Pole, MD;  Location: [facility];  Service: Orthopedics;  Laterality: Left;  ? UPPER ESOPHAGEAL ENDOSCOPIC ULTRASOUND (EUS) N/A 05/14/2016  ? Procedure: UPPER ESOPHAGEAL ENDOSCOPIC ULTRASOUND (EUS);  Surgeon: Milus Banister, MD;  Location: [facility];  Service: Endoscopy;  Laterality: N/A;  radial only-will be done mod if needed  ? ? ?Family History  ?Problem Relation Age of Onset   ? Heart Problems Mother   ? Heart defect Mother   ?     valve problems  ? Heart Problems Father   ? Heart disease Brother   ? Lung cancer Daughter   ?     non small cell   ? Diabetes Maternal Aunt   ? Diabetes Maternal Uncle   ? Throat cancer Brother   ? Colon cancer Neg Hx   ? Colon polyps Neg Hx   ? Esophageal cancer Neg Hx   ? Rectal cancer Neg Hx   ? Stomach cancer Neg Hx   ? ? ?Social History:  reports that she quit smoking about 24  years ago. Her smoking use included cigarettes. She started smoking about 57 years ago. She has a 66.00 pack-year smoking history. She has never used smokeless tobacco. She reports that she does not drink alcohol and does not use drugs. ? ?Allergies:  ?Allergies  ?Allergen Reactions  ? Dulaglutide Nausea Only, Other (See Comments) and Cough  ?  Made the patient sick to her stomach (only) several days after using it  ? Oxycodone Itching and Other (See Comments)  ?  Pt still takes this med even thought it causes itching  ? Trelegy Ellipta [Fluticasone-Umeclidin-Vilant] Nausea Only, Other (See Comments) and Cough  ?  Made the patient sick to her stomach (only) several days after using it   ? ? ?Medications: I have reviewed the patient's current medications. ?Scheduled: ? allopurinol  100 mg Oral BID  ? amiodarone  200 mg Oral QHS  ? amLODipine  5 mg Oral Daily  ? atorvastatin  40 mg Oral Daily  ? Budeson-Glycopyrrol-Formoterol  2 puff Inhalation BID  ? Chlorhexidine Gluconate Cloth  6 each Topical Daily  ? fluticasone  2 spray Each Nare Daily  ? gabapentin  300 mg Oral TID  ? insulin aspart  0-15 Units Subcutaneous Q4H  ? levothyroxine  200 mcg Oral QAC breakfast  ? losartan  50 mg Oral Daily  ? metoprolol succinate  100 mg Oral Daily  ? mupirocin ointment  1 application. Nasal BID  ? torsemide  80 mg Oral Daily  ? ? ?Results for orders placed or performed during the hospital encounter of 07/21/21 (from the past 24 hour(s))  ?CBC with Differential     Status: Abnormal  ? Collection  Time: 07/21/21  2:21 PM  ?Result Value Ref Range  ? WBC 27.8 (H) 4.0 - 10.5 K/uL  ? RBC 3.77 (L) 3.87 - 5.11 MIL/uL  ? Hemoglobin 10.8 (L) 12.0 - 15.0 g/dL  ? HCT 35.0 (L) 36.0 - 46.0 %  ? MCV 92.8 80.0 - 100.0 f

## 2021-07-22 NOTE — Brief Op Note (Signed)
07/21/2021 - 07/22/2021 ? ?4:04 PM ? ?PATIENT:  [name]  81 y.o. female ? ?PRE-OPERATIVE DIAGNOSIS:  Recurrent RIGHT PREPATELLAR BURsitis ? ?POST-OPERATIVE DIAGNOSIS:  Recurrent RIGHT PREPATELLAR BURsitis ? ?PROCEDURE:  Procedure(s): ?INCISION AND DRAINAGE ABSCESS RIGHT KNEE (Right) ? ?SURGEON:  Surgeon(s) and Role: ?   Paralee Cancel, MD - Primary ? ?PHYSICIAN ASSISTANT: Costella Hatcher, PA-C ? ?ANESTHESIA:   general ? ?EBL:  Minimal due to tourniquet use ? ?BLOOD ADMINISTERED:none ? ?DRAINS: (1 medium) Hemovact drain(s) in the right knee prepatellar space with  Suction Open  ? ?LOCAL MEDICATIONS USED:  NONE ? ?SPECIMEN:  Source of Specimen:  right pre-patellar fluid ? ?DISPOSITION OF SPECIMEN:  PATHOLOGY ? ?COUNTS:  YES ? ?TOURNIQUET:  29 min at 225 mmHg ? ?DICTATION: .Other Dictation: Dictation Number [id number] ? ?PLAN OF CARE: Admit to inpatient  ? ?PATIENT DISPOSITION:  PACU - hemodynamically stable. ?  ?Delay start of Pharmacological VTE agent (>24hrs) due to surgical blood loss or risk of bleeding: no ? ?

## 2021-07-22 NOTE — Discharge Instructions (Addendum)
INSTRUCTIONS AFTER SURGERY ? ?Remove items at home which could result in a fall. This includes throw rugs or furniture in walking pathways ?ICE to the affected joint every three hours while awake for 30 minutes at a time, for at least the first 3-5 days, and then as needed for pain and swelling.  Continue to use ice for pain and swelling. You may notice swelling that will progress down to the foot and ankle.  This is normal after surgery.  Elevate your leg when you are not up walking on it.   ?Continue to use the breathing machine you got in the hospital (incentive spirometer) which will help keep your temperature down.  It is common for your temperature to cycle up and down following surgery, especially at night when you are not up moving around and exerting yourself.  The breathing machine keeps your lungs expanded and your temperature down. ? ? ?DIET:  As you were doing prior to hospitalization, we recommend a well-balanced diet. ? ?DRESSING / WOUND CARE / SHOWERING ? ?I have recommended that she try to wash knee wound with soap and water daily and then cover with Mepilex type dressing to protect wound while healing ? ?ACTIVITY ? ?Increase activity slowly as tolerated, but follow the weight bearing instructions below.   ?No driving for 6 weeks or until further direction given by your physician.  You cannot drive while taking narcotics.  ?No lifting or carrying greater than 10 lbs. until further directed by your surgeon. ?Avoid periods of inactivity such as sitting longer than an hour when not asleep. This helps prevent blood clots.  ?You may return to work once you are authorized by your doctor.  ? ? ? ?WEIGHT BEARING  ? ?Weight bearing as tolerated with assist device (walker, cane, etc) as directed, use it as long as suggested by your surgeon or therapist, typically at least 4-6 weeks. ? ?CONSTIPATION ? ?Constipation is defined medically as fewer than three stools per week and severe constipation as less than  one stool per week.  Even if you have a regular bowel pattern at home, your normal regimen is likely to be disrupted due to multiple reasons following surgery.  Combination of anesthesia, postoperative narcotics, change in appetite and fluid intake all can affect your bowels.  ? ?YOU MUST use at least one of the following options; they are listed in order of increasing strength to get the job done.  They are all available over the counter, and you may need to use some, POSSIBLY even all of these options:   ? ?Drink plenty of fluids (prune juice may be helpful) and high fiber foods ?Colace 100 mg by mouth twice a day  ?Senokot for constipation as directed and as needed Dulcolax (bisacodyl), take with full glass of water  ?Miralax (polyethylene glycol) once or twice a day as needed. ? ?If you have tried all these things and are unable to have a bowel movement in the first 3-4 days after surgery call either your surgeon or your primary doctor.   ? ?If you experience loose stools or diarrhea, hold the medications until you stool forms back up.  If your symptoms do not get better within 1 week or if they get worse, check with your doctor.  If you experience "the worst abdominal pain ever" or develop nausea or vomiting, please contact the office immediately for further recommendations for treatment. ? ? ?ITCHING:  If you experience itching with your medications, try taking only a  single pain pill, or even half a pain pill at a time.  You can also use Benadryl over the counter for itching or also to help with sleep.  ? ?TED HOSE STOCKINGS:  Use stockings on both legs until for at least 2 weeks or as directed by physician office. They may be removed at night for sleeping. ? ?MEDICATIONS:  See your medication summary on the ?After Visit Summary? that nursing will review with you.  You may have some home medications which will be placed on hold until you complete the course of blood thinner medication.  It is important for  you to complete the blood thinner medication as prescribed. ? ?PRECAUTIONS:  If you experience chest pain or shortness of breath - call 911 immediately for transfer to the hospital emergency department.  ? ?If you develop a fever greater that 101 F, purulent drainage from wound, increased redness or drainage from wound, foul odor from the wound/dressing, or calf pain - CONTACT YOUR SURGEON.   ?                                                ?FOLLOW-UP APPOINTMENTS:  If you do not already have a post-op appointment, please call the office for an appointment to be seen by your surgeon.  Guidelines for how soon to be seen are listed in your ?After Visit Summary?, but are typically between 1-4 weeks after surgery. ? ?POST-OPERATIVE OPIOID TAPER INSTRUCTIONS: ?It is important to wean off of your opioid medication as soon as possible. If you do not need pain medication after your surgery it is ok to stop day one. ?Opioids include: ?Codeine, Hydrocodone(Norco, Vicodin), Oxycodone(Percocet, oxycontin) and hydromorphone amongst others.  ?Long term and even short term use of opiods can cause: ?Increased pain response ?Dependence ?Constipation ?Depression ?Respiratory depression ?And more.  ?Withdrawal symptoms can include ?Flu like symptoms ?Nausea, vomiting ?And more ?Techniques to manage these symptoms ?Hydrate well ?Eat regular healthy meals ?Stay active ?Use relaxation techniques(deep breathing, meditating, yoga) ?Do Not substitute Alcohol to help with tapering ?If you have been on opioids for less than two weeks and do not have pain than it is ok to stop all together.  ?Plan to wean off of opioids ?This plan should start within one week post op of your joint replacement. ?Maintain the same interval or time between taking each dose and first decrease the dose.  ?Cut the total daily intake of opioids by one tablet each day ?Next start to increase the time between doses. ?The last dose that should be eliminated is the  evening dose.  ? ?MAKE SURE YOU:  ?Understand these instructions.  ?Get help right away if you are not doing well or get worse.  ? ? ?Thank you for letting us be a part of your medical care team.  It is a privilege we respect greatly.  We hope these instructions will help you stay on track for a fast and full recovery!  ? ? ? ? ?

## 2021-07-22 NOTE — Transfer of Care (Signed)
Immediate Anesthesia Transfer of Care Note ? ?Patient: Kayla [name] ? ?Procedure(s) Performed: INCISION AND DRAINAGE ABSCESS RIGHT KNEE (Right) ? ?Patient Location: PACU ? ?Anesthesia Type:General ? ?Level of Consciousness: awake, drowsy and patient cooperative ? ?Airway & Oxygen Therapy: Patient Spontanous Breathing and Patient connected to face mask oxygen ? ?Post-op Assessment: Report given to RN and Post -op Vital signs reviewed and stable ? ?Post vital signs: Reviewed and stable ? ?Last Vitals:  ?Vitals Value Taken Time  ?BP 145/62 07/22/21 1615  ?Temp    ?Pulse 61 07/22/21 1618  ?Resp 20 07/22/21 1618  ?SpO2 99 % 07/22/21 1618  ?Vitals shown include unvalidated device data. ? ?Last Pain:  ?Vitals:  ? 07/22/21 1404  ?TempSrc: Oral  ?PainSc: 7   ?   ? ?Patients Stated Pain Goal: 2 (07/22/21 0610) ? ?Complications: No notable events documented. ?

## 2021-07-22 NOTE — Anesthesia Preprocedure Evaluation (Signed)
Anesthesia Evaluation  ?Patient identified by MRN, date of birth, ID band ?Patient awake ? ? ? ?Reviewed: ?Allergy & Precautions, NPO status , Patient's Chart, lab work & pertinent test results ? ?Airway ?Mallampati: II ? ?TM Distance: >3 FB ?Neck ROM: Full ? ? ? Dental ?no notable dental hx. ? ?  ?Pulmonary ?sleep apnea and Continuous Positive Airway Pressure Ventilation , COPD, former smoker,  ?Pulmonary HTN ?  ?Pulmonary exam normal ?breath sounds clear to auscultation ? ? ? ? ? ? Cardiovascular ?hypertension, Normal cardiovascular exam+ pacemaker  ?Rhythm:Regular Rate:Normal ? ? ?  ?Neuro/Psych ?negative neurological ROS ? negative psych ROS  ? GI/Hepatic ?negative GI ROS, Neg liver ROS,   ?Endo/Other  ?diabetes, Type 2Hypothyroidism  ? Renal/GU ?Renal InsufficiencyRenal disease  ?negative genitourinary ?  ?Musculoskeletal ?negative musculoskeletal ROS ?(+)  ? Abdominal ?  ?Peds ?negative pediatric ROS ?(+)  Hematology ? ?(+) Blood dyscrasia, anemia ,   ?Anesthesia Other Findings ? ? Reproductive/Obstetrics ?negative OB ROS ? ?  ? ? ? ? ? ? ? ? ? ? ? ? ? ?  ?  ? ? ? ? ? ? ? ? ?Anesthesia Physical ?Anesthesia Plan ? ?ASA: 3 ? ?Anesthesia Plan: General  ? ?Post-op Pain Management: Minimal or no pain anticipated  ? ?Induction: Intravenous ? ?PONV Risk Score and Plan: 3 and Ondansetron, Dexamethasone, Droperidol and Treatment may vary due to age or medical condition ? ?Airway Management Planned: LMA ? ?Additional Equipment:  ? ?Intra-op Plan:  ? ?Post-operative Plan: Extubation in OR ? ?Informed Consent: I have reviewed the patients History and Physical, chart, labs and discussed the procedure including the risks, benefits and alternatives for the proposed anesthesia with the patient or authorized representative who has indicated his/her understanding and acceptance.  ? ? ? ?Dental advisory given ? ?Plan Discussed with: CRNA and Surgeon ? ?Anesthesia Plan Comments:    ? ? ? ? ? ? ?Anesthesia Quick Evaluation ? ?

## 2021-07-22 NOTE — Anesthesia Procedure Notes (Signed)
Procedure Name: LMA Insertion ?Date/Time: 07/22/2021 3:20 PM ?Performed by: Sharlette Dense, CRNA ?Patient Re-evaluated:Patient Re-evaluated prior to induction ?Oxygen Delivery Method: Circle system utilized ?Preoxygenation: Pre-oxygenation with 100% oxygen ?Induction Type: IV induction ?LMA: LMA inserted ?LMA Size: 4.0 ?Number of attempts: 1 ?Placement Confirmation: positive ETCO2 and breath sounds checked- equal and bilateral ?Tube secured with: Tape ?Dental Injury: Teeth and Oropharynx as per pre-operative assessment  ? ? ? ? ?

## 2021-07-23 ENCOUNTER — Encounter (HOSPITAL_COMMUNITY): Payer: Self-pay | Admitting: Orthopedic Surgery

## 2021-07-23 ENCOUNTER — Inpatient Hospital Stay (HOSPITAL_COMMUNITY): Payer: Medicare HMO

## 2021-07-23 DIAGNOSIS — M71161 Other infective bursitis, right knee: Secondary | ICD-10-CM | POA: Diagnosis not present

## 2021-07-23 LAB — BASIC METABOLIC PANEL
Anion gap: 11 (ref 5–15)
Anion gap: 13 (ref 5–15)
Anion gap: 12 (ref 5–15)
Anion gap: 11 (ref 5–15)
BUN: 58 mg/dL — ABNORMAL HIGH (ref 8–23)
BUN: 61 mg/dL — ABNORMAL HIGH (ref 8–23)
BUN: 65 mg/dL — ABNORMAL HIGH (ref 8–23)
BUN: 64 mg/dL — ABNORMAL HIGH (ref 8–23)
CO2: 21 mmol/L — ABNORMAL LOW (ref 22–32)
CO2: 19 mmol/L — ABNORMAL LOW (ref 22–32)
CO2: 22 mmol/L (ref 22–32)
CO2: 21 mmol/L — ABNORMAL LOW (ref 22–32)
Calcium: 7.9 mg/dL — ABNORMAL LOW (ref 8.9–10.3)
Calcium: 8.4 mg/dL — ABNORMAL LOW (ref 8.9–10.3)
Calcium: 8.5 mg/dL — ABNORMAL LOW (ref 8.9–10.3)
Calcium: 8.5 mg/dL — ABNORMAL LOW (ref 8.9–10.3)
Chloride: 102 mmol/L (ref 98–111)
Chloride: 100 mmol/L (ref 98–111)
Chloride: 100 mmol/L (ref 98–111)
Chloride: 103 mmol/L (ref 98–111)
Creatinine, Ser: 2.25 mg/dL — ABNORMAL HIGH (ref 0.44–1.00)
Creatinine, Ser: 2.34 mg/dL — ABNORMAL HIGH (ref 0.44–1.00)
Creatinine, Ser: 2.13 mg/dL — ABNORMAL HIGH (ref 0.44–1.00)
Creatinine, Ser: 2.22 mg/dL — ABNORMAL HIGH (ref 0.44–1.00)
GFR, Estimated: 22 mL/min — ABNORMAL LOW (ref >60)
GFR, Estimated: 21 mL/min — ABNORMAL LOW (ref >60)
GFR, Estimated: 23 mL/min — ABNORMAL LOW (ref >60)
GFR, Estimated: 22 mL/min — ABNORMAL LOW (ref >60)
Glucose, Bld: 334 mg/dL — ABNORMAL HIGH (ref 70–99)
Glucose, Bld: 524 mg/dL (ref 70–99)
Glucose, Bld: 490 mg/dL — ABNORMAL HIGH (ref 70–99)
Glucose, Bld: 328 mg/dL — ABNORMAL HIGH (ref 70–99)
Potassium: 4.4 mmol/L (ref 3.5–5.1)
Potassium: 4.8 mmol/L (ref 3.5–5.1)
Potassium: 4.7 mmol/L (ref 3.5–5.1)
Potassium: 4.3 mmol/L (ref 3.5–5.1)
Sodium: 134 mmol/L — ABNORMAL LOW (ref 135–145)
Sodium: 132 mmol/L — ABNORMAL LOW (ref 135–145)
Sodium: 134 mmol/L — ABNORMAL LOW (ref 135–145)
Sodium: 135 mmol/L (ref 135–145)

## 2021-07-23 LAB — GLUCOSE, CAPILLARY
Glucose-Capillary: 258 mg/dL — ABNORMAL HIGH (ref 70–99)
Glucose-Capillary: 285 mg/dL — ABNORMAL HIGH (ref 70–99)
Glucose-Capillary: 294 mg/dL — ABNORMAL HIGH (ref 70–99)
Glucose-Capillary: 333 mg/dL — ABNORMAL HIGH (ref 70–99)
Glucose-Capillary: 335 mg/dL — ABNORMAL HIGH (ref 70–99)
Glucose-Capillary: 346 mg/dL — ABNORMAL HIGH (ref 70–99)
Glucose-Capillary: 403 mg/dL — ABNORMAL HIGH (ref 70–99)
Glucose-Capillary: 418 mg/dL — ABNORMAL HIGH (ref 70–99)
Glucose-Capillary: 434 mg/dL — ABNORMAL HIGH (ref 70–99)
Glucose-Capillary: 449 mg/dL — ABNORMAL HIGH (ref 70–99)
Glucose-Capillary: 459 mg/dL — ABNORMAL HIGH (ref 70–99)

## 2021-07-23 LAB — OSMOLALITY: Osmolality: 326 mOsm/kg (ref 275–295)

## 2021-07-23 LAB — CBC
HCT: 29.8 % — ABNORMAL LOW (ref 36.0–46.0)
Hemoglobin: 9.2 g/dL — ABNORMAL LOW (ref 12.0–15.0)
MCH: 29 pg (ref 26.0–34.0)
MCHC: 30.9 g/dL (ref 30.0–36.0)
MCV: 94 fL (ref 80.0–100.0)
Platelets: 422 10*3/uL — ABNORMAL HIGH (ref 150–400)
RBC: 3.17 MIL/uL — ABNORMAL LOW (ref 3.87–5.11)
RDW: 15.8 % — ABNORMAL HIGH (ref 11.5–15.5)
WBC: 14.8 10*3/uL — ABNORMAL HIGH (ref 4.0–10.5)
nRBC: 0 % (ref 0.0–0.2)

## 2021-07-23 LAB — SODIUM, URINE, RANDOM: Sodium, Ur: 28 mmol/L

## 2021-07-23 LAB — CREATININE, URINE, RANDOM: Creatinine, Urine: 67.98 mg/dL

## 2021-07-23 MED ORDER — DEXTROSE IN LACTATED RINGERS 5 % IV SOLN: INTRAVENOUS | Status: DC

## 2021-07-23 MED ORDER — INSULIN ASPART 100 UNIT/ML IJ SOLN: 0.0000 [IU] | Freq: Three times a day (TID) | INTRAMUSCULAR | Status: DC

## 2021-07-23 MED ORDER — INSULIN GLARGINE-YFGN 100 UNIT/ML ~~LOC~~ SOLN
15.0000 [IU] | Freq: Two times a day (BID) | SUBCUTANEOUS | Status: DC
  Filled 2021-07-23: qty 0.15

## 2021-07-23 MED ORDER — SODIUM CHLORIDE 0.9 % IV SOLN
8.0000 mg/kg | INTRAVENOUS | Status: DC
  Administered 2021-07-23 – 2021-07-27 (×3): 850 mg via INTRAVENOUS
  Filled 2021-07-23 (×3): qty 17

## 2021-07-23 MED ORDER — INSULIN ASPART 100 UNIT/ML IJ SOLN: 0.0000 [IU] | Freq: Every day | INTRAMUSCULAR | Status: DC

## 2021-07-23 MED ORDER — ORAL CARE MOUTH RINSE
15.0000 mL | Freq: Two times a day (BID) | OROMUCOSAL | Status: DC
  Administered 2021-07-24 – 2021-07-26 (×5): 15 mL via OROMUCOSAL

## 2021-07-23 MED ORDER — INSULIN ASPART 100 UNIT/ML IJ SOLN
13.0000 [IU] | Freq: Once | INTRAMUSCULAR | Status: AC
  Administered 2021-07-23: 13 [IU] via SUBCUTANEOUS

## 2021-07-23 MED ORDER — LACTATED RINGERS IV SOLN: INTRAVENOUS | Status: DC

## 2021-07-23 MED ORDER — CHLORHEXIDINE GLUCONATE 0.12 % MT SOLN
15.0000 mL | Freq: Two times a day (BID) | OROMUCOSAL | Status: DC
  Administered 2021-07-23 – 2021-07-27 (×8): 15 mL via OROMUCOSAL
  Filled 2021-07-23 (×8): qty 15

## 2021-07-23 MED ORDER — INSULIN ASPART 100 UNIT/ML IJ SOLN
15.0000 [IU] | Freq: Once | INTRAMUSCULAR | Status: AC
  Administered 2021-07-23: 15 [IU] via SUBCUTANEOUS

## 2021-07-23 MED ORDER — INSULIN ASPART 100 UNIT/ML IJ SOLN
15.0000 [IU] | Freq: Once | INTRAMUSCULAR | Status: DC
Start: 2021-07-23 — End: 2021-07-23

## 2021-07-23 MED ORDER — INSULIN GLARGINE-YFGN 100 UNIT/ML ~~LOC~~ SOLN
15.0000 [IU] | Freq: Every day | SUBCUTANEOUS | Status: DC
  Administered 2021-07-23: 15 [IU] via SUBCUTANEOUS
  Filled 2021-07-23: qty 0.15

## 2021-07-23 MED ORDER — APIXABAN 2.5 MG PO TABS
2.5000 mg | ORAL_TABLET | Freq: Two times a day (BID) | ORAL | Status: DC
Start: 2021-07-23 — End: 2021-07-27
  Administered 2021-07-23 – 2021-07-27 (×8): 2.5 mg via ORAL
  Filled 2021-07-23 (×8): qty 1

## 2021-07-23 MED ORDER — INSULIN ASPART 100 UNIT/ML IJ SOLN
3.0000 [IU] | Freq: Three times a day (TID) | INTRAMUSCULAR | Status: DC
  Administered 2021-07-23: 3 [IU] via SUBCUTANEOUS

## 2021-07-23 MED ORDER — DEXTROSE 50 % IV SOLN: 0.0000 mL | INTRAVENOUS | Status: DC | PRN

## 2021-07-23 MED ORDER — INSULIN REGULAR(HUMAN) IN NACL 100-0.9 UT/100ML-% IV SOLN
INTRAVENOUS | Status: DC
  Administered 2021-07-23: 14 [IU]/h via INTRAVENOUS
  Administered 2021-07-24: 3.8 [IU]/h via INTRAVENOUS
  Filled 2021-07-23 (×3): qty 100

## 2021-07-23 MED ORDER — SODIUM CHLORIDE 0.9 % IV SOLN: INTRAVENOUS | Status: DC

## 2021-07-23 MED ORDER — SODIUM CHLORIDE 0.9 % IV SOLN: INTRAVENOUS | Status: DC | PRN

## 2021-07-23 MED ORDER — IPRATROPIUM-ALBUTEROL 0.5-2.5 (3) MG/3ML IN SOLN
3.0000 mL | RESPIRATORY_TRACT | Status: DC | PRN
  Administered 2021-07-23 – 2021-07-24 (×2): 3 mL via RESPIRATORY_TRACT
  Filled 2021-07-23 (×2): qty 3

## 2021-07-23 NOTE — Progress Notes (Signed)
Inpatient Diabetes Program Recommendations ? ?AACE/ADA: New Consensus Statement on Inpatient Glycemic Control (2015) ? ?Target Ranges:  Prepandial:   less than 140 mg/dL ?     Peak postprandial:   less than 180 mg/dL (1-2 hours) ?     Critically ill patients:  140 - 180 mg/dL  ? ?Lab Results  ?Component Value Date  ? GLUCAP 333 (H) 07/23/2021  ? HGBA1C 8.4 (H) 05/30/2021  ? ? ?Review of Glycemic Control ? ?Diabetes history: DM2 ?Outpatient Diabetes medications: Lantus 110 units BID (adjust if BG is low), Novolog 5-15 units TID, add 1 unit for every 50 above goal of 130 mg/dL ?Current orders for Inpatient glycemic control: Novolog 0-15 units Q4H ? ?HgbA1C - 8.4% ?Needs part of basal insulin home dose ?Likely needs meal coverage insulin ? ?Inpatient Diabetes Program Recommendations:   ? ?Consider Semglee 20 units QD ?Add Novolog 5 units TID with meals if eating > 50%. ?Change Novolog to TID with meals and 0-5 HS  ? ?Follow closely. ? ?Thank you. ?Lorenda Peck, RD, LDN, CDE ?Inpatient Diabetes Coordinator ?[phone]  ? ? ? ? ? ? ? ? ?

## 2021-07-23 NOTE — Consult Note (Signed)
Renal Service ?Consult Note ?[facility] ? ?Kayla [name] ?07/23/2021 ?Sol Blazing, MD ?Requesting Physician: Dr. Tyrell Antonio ? ?Reason for Consult: Renal failure ?HPI: The patient is a 81 y.o. year-old w/ hx of CAD, CKD, COPD, Dm2, HTN, HL, PAF, sp L TKR, OSA, UTI presented on 07/21/21 w/ knee pain. In ED WBC was 27K, knee red and tender. IV abx started and orthopedic surgery took pt to OR for I&D of R knee prepatellar space on 3/21.  Creat 1.6 on admission, today is up to 2.25.  Asked to see for renal failure.  ? ?Pt admitted and started on IV vanc ,which was dc' dtoday and changed to daptomycin. Also getting IV rocephin.   here for AKI. Got torsemide 80 mg  and cozaar here yesterday, both dc'd now. NO IV contrast, no sig BP drops. CXR was unremarkable on 3/20. Repeated CXR today, still no edema. UOP good 1 L yest and 1.2 L today.  I/O here are 4.9 L in and 2.4 L out.  ? ? ? ?ROS - denies CP, no joint pain, no HA, no blurry vision, no rash, no diarrhea, no nausea/ vomiting, no dysuria, no difficulty voiding ? ? ?Past Medical History  ?Past Medical History:  ?Diagnosis Date  ? Allergy   ? Anxiety   ? Arthritis   ? CAD (coronary artery disease)   ? Mild per remote cath in 2006, /    Nuclear, January, 2013, no significant abnormality,  ? Carotid artery disease ([place])   ? Doppler, January, 2013, 0 16% RIC A., 55-37% LICA, stable  ? Cataract   ? CHF (congestive heart failure) ([place])   ? Chronic kidney disease   ? Complication of anesthesia   ? wakes up "mean"  ? COPD 02/16/2007  ? Intolerant of Spiriva, tudorza Intolerant of Trelegy   ? COPD (chronic obstructive pulmonary disease) ([place])   ? Coronary atherosclerosis 11/24/2008  ? Catheterization 2006, nonobstructive coronary disease   //   nuclear 2013, low risk    ? Diabetes mellitus, type 2 ([place])   ? Diverticula of colon   ? DIVERTICULOSIS OF COLON 03/23/2007  ? Qualifier: Diagnosis of  By: Halford Chessman MD, Vineet    ? Dizziness   ? occasional  ? Dizzy spells  05/04/2011  ? DM (diabetes mellitus) ([place]) 03/23/2007  ? Qualifier: Diagnosis of  By: Halford Chessman MD, Vineet    ? Dyspnea   ? On exertion  ? Ejection fraction   ? Essential hypertension 02/16/2007  ? Qualifier: Diagnosis of  By: Rosana Hoes CMA, Tammy    ? G E R D 03/23/2007  ? Qualifier: Diagnosis of  By: Halford Chessman MD, Vineet    ? Gall bladder disease 04/28/2016  ? Gastritis and gastroduodenitis   ? GERD (gastroesophageal reflux disease)   ? Goiter   ? hx of  ? Headache(784.0)   ? migraine  ? Hyperlipidemia   ? Hypertension   ? Hypothyroidism   ? HYPOTHYROIDISM, POSTSURGICAL 06/04/2008  ? Qualifier: Diagnosis of  By: Loanne Drilling MD, Jacelyn Pi   ? Left ventricular hypertrophy   ? Leg edema   ? occasional swelling  ? Leg swelling 12/09/2018  ? Myofascial pain syndrome, cervical 01/06/2018  ? Obesity   ? OBESITY 11/24/2008  ? Qualifier: Diagnosis of  By: Sidney Ace    ? OBSTRUCTIVE SLEEP APNEA 02/16/2007  ? Auto CPAP 09/03/13 to 10/02/13 >> used on 30 of 30 nights with average 6 hrs and 3 min.  Average AHI is  3.1 with median CPAP 5 cm H2O and 95 th percentile CPAP 6 cm H2O.    ? Occipital neuralgia of right side 01/06/2018  ? OSA (obstructive sleep apnea)   ? cpap setting of 12  ? Pain in joint of right hip 01/21/2018  ? Palpitations 01/21/2011  ? 48 hour Holter, 2015, scattered PACs and PVCs.   ? Paroxysmal atrial fibrillation (HCC)   ? Pneumonia   ? PONV (postoperative nausea and vomiting)   ? severe after every surgery  ? Pulmonary hypertension, secondary   ? RHINITIS 07/21/2010  ?     ? RUQ abdominal pain   ? S/P left TKA 07/27/2011  ? Sinus node dysfunction ([place]) 11/08/2015  ? Sleep apnea   ? Spinal stenosis of cervical region 11/07/2014  ? Urinary frequency 12/09/2018  ? UTI (urinary tract infection) 08/15/2019  ? per pt report  ? VENTRICULAR HYPERTROPHY, LEFT 11/24/2008  ? Qualifier: Diagnosis of  By: Sidney Ace    ? ?Past Surgical History  ?Past Surgical History:  ?Procedure Laterality Date  ? ABDOMINAL HYSTERECTOMY    ? with  appendectomy and colon polypectomy  ? AMPUTATION TOE Left 12/15/2019  ? Procedure: AMPUTATION  LEFT GREAT TOE;  Surgeon: Evelina Bucy, DPM;  Location: [facility];  Service: Podiatry;  Laterality: Left;  ? ANTERIOR CERVICAL DECOMP/DISCECTOMY FUSION N/A 11/07/2014  ? Procedure: Anterior Cervical diskectomy and fusion Cervical four-five, Cervical five-six, Cervical six-seven;  Surgeon: Kary Kos, MD;  Location: [facility];  Service: Neurosurgery;  Laterality: N/A;  ? APPENDECTOMY    ? ARTERY BIOPSY Right 02/16/2017  ? Procedure: BIOPSY TEMPORAL ARTERY;  Surgeon: Katha Cabal, MD;  Location: [facility];  Service: Vascular;  Laterality: Right;  ? ARTERY BIOPSY Left 04/16/2020  ? Procedure: BIOPSY TEMPORAL ARTERY;  Surgeon: Katha Cabal, MD;  Location: [facility] ORS;  Service: Vascular;  Laterality: Left;  LEFT TEMPLE  ? BACK SURGERY    ? CARDIAC CATHETERIZATION    ? CATARACT EXTRACTION W/ INTRAOCULAR LENS  IMPLANT, BILATERAL Bilateral   ? CHOLECYSTECTOMY N/A 04/29/2016  ? Procedure: LAPAROSCOPIC CHOLECYSTECTOMY WITH INTRAOPERATIVE CHOLANGIOGRAM;  Surgeon: Alphonsa Overall, MD;  Location: [facility];  Service: General;  Laterality: N/A;  ? EP IMPLANTABLE DEVICE N/A 11/08/2015  ? MRI COMPATABLE -- Procedure: Pacemaker Implant;  Surgeon: Deboraha Sprang, MD;  Location: [facility];  Service: Cardiovascular;  Laterality: N/A;  ? INCISION AND DRAINAGE ABSCESS Right 05/30/2021  ? Procedure: IRRIGATION AND DEBRIDEMENT OF PREPATELLA BURSA;  Surgeon: Paralee Cancel, MD;  Location: [facility];  Service: Orthopedics;  Laterality: Right;  60 ?wants standard knee draping and pulse lavage  ? INCISION AND DRAINAGE ABSCESS Right 07/22/2021  ? Procedure: INCISION AND DRAINAGE ABSCESS RIGHT KNEE;  Surgeon: Paralee Cancel, MD;  Location: [facility];  Service: Orthopedics;  Laterality: Right;  ? INSERT / REPLACE / REMOVE PACEMAKER  2017  ? Dr. Jens Som [facility])  ? JOINT REPLACEMENT    ? LUMBAR DISC SURGERY  1990's  ? x 2  ? THYROIDECTOMY  2011  ?  TOTAL KNEE ARTHROPLASTY  07/27/2011  ? Procedure: TOTAL KNEE ARTHROPLASTY;  Surgeon: Mauri Pole, MD;  Location: [facility];  Service: Orthopedics;  Laterality: Left;  ? UPPER ESOPHAGEAL ENDOSCOPIC ULTRASOUND (EUS) N/A 05/14/2016  ? Procedure: UPPER ESOPHAGEAL ENDOSCOPIC ULTRASOUND (EUS);  Surgeon: Milus Banister, MD;  Location: [facility];  Service: Endoscopy;  Laterality: N/A;  radial only-will be done mod if needed  ? ?Family History  ?Family History  ?  Problem Relation Age of Onset  ? Heart Problems Mother   ? Heart defect Mother   ?     valve problems  ? Heart Problems Father   ? Heart disease Brother   ? Lung cancer Daughter   ?     non small cell   ? Diabetes Maternal Aunt   ? Diabetes Maternal Uncle   ? Throat cancer Brother   ? Colon cancer Neg Hx   ? Colon polyps Neg Hx   ? Esophageal cancer Neg Hx   ? Rectal cancer Neg Hx   ? Stomach cancer Neg Hx   ? ?Social History  reports that she quit smoking about 24 years ago. Her smoking use included cigarettes. She started smoking about 57 years ago. She has a 66.00 pack-year smoking history. She has never used smokeless tobacco. She reports that she does not drink alcohol and does not use drugs. ?Allergies  ?Allergies  ?Allergen Reactions  ? Dulaglutide Nausea Only, Other (See Comments) and Cough  ?  Made the patient sick to her stomach (only) several days after using it  ? Oxycodone Itching and Other (See Comments)  ?  Pt still takes this med even thought it causes itching  ? Trelegy Ellipta [Fluticasone-Umeclidin-Vilant] Nausea Only, Other (See Comments) and Cough  ?  Made the patient sick to her stomach (only) several days after using it   ? ?Home medications ?Prior to Admission medications   ?Medication Sig Start Date End Date Taking? Authorizing Provider  ?acetaminophen (TYLENOL) 500 MG tablet Take 1,000 mg by mouth every 6 (six) hours as needed for mild pain, moderate pain or headache.   Yes [provider]  ?albuterol (PROVENTIL) (2.5 MG/3ML)  0.083% nebulizer solution Take 3 mLs (2.5 mg total) by nebulization every 2 (two) hours as needed for wheezing or shortness of breath. ?Patient taking differently: Take 2.5 mg by nebulization in the morning, at

## 2021-07-23 NOTE — Progress Notes (Addendum)
?Progress Note ? ? ?Patient: Kayla [name] OZY:[id number] DOB: 06/21/1940 DOA: 07/21/2021     2 ?DOS: the patient was seen and examined on 07/23/2021 ?  ?Brief hospital course: ?ADREANNA FICKEL is an 81 y.o. female with a history of CHF, CKD stage 3b, COPD, chronic pain, CAD, diabetes mellitus on insulin, hypertension, paroxysmal atrial fibrillation. Patient presented secondary to worsening knee pain and erythema and found to have evidence of recurrent septic prepatellar bursitis. Empiric antibiotics initiated and orthopedic surgery consulted.  Patient underwent excisional l and non excisional debridement of right knee prepatellar space including a combined 5-6 inch incision, Hemovac drain was placed by Dr. Alvan Dame on 3/21. ? ?ID consulted, to assist with IV antibiotics, patient may require IV antibiotics at discharge. ? ?Assessment and Plan: ?* Septic prepatellar bursitis of right knee ?Recurrent. Associated leukocytosis with WBC of 27,800. Orthopedic surgery consulted, underwent  I&D 3/21.  ?-Follow wound culture: growing gram positive cocci.  ?-Follow-up blood cultures: No growth to date.  ?-ID consulted, Vancomycin change to Daptomycin die to AKI. Continue with ceftriaxone.  ? ? ?Paroxysmal atrial fibrillation (HCC) ?Patient is on Eliquis, amiodarone and Toprol XL as an outpatient. ?-Continue amiodarone and Toprol XL ?-Okay to resume Eliquis per surgery ? ?Hypertension associated with diabetes ([place]) ?Patient is on losartan, Toprol XL and amlodipine as an outpatient. ?-Continue Toprol XL and amlodipine. ?-Hold Cozaar due to AKI ? ?Insulin dependent type 2 diabetes mellitus ([place]) ?Hemoglobin A1C of 8.4% from two months back. Patient is on Lantus 110 units daily and Novolog sliding scale as an outpatient. Hypoglycemia while in the ED with blood sugar of 53. ?-SSI q4 hours while NPO ?-Hyperglycemia, CBG increase up to 400.  Restarted long-acting Semglee.  Will add 3 units meal coverage. ?-She did receive a dose of IV  steroids ? ?Chronic kidney disease, stage 3b ([place]) ?Baseline creatinine of about 1.6--1.8 ?AKI on CKD stage 3b ?Patient creatinine increased to 1.2. ?Plan to hold Cozaar on torsemide. ?Started IV fluids. ?Monitor  urine output. ?Trelegy consulted. ? ?Pacemaker - MDT ?Noted. ? ?Chronic diastolic heart failure ([place]) ?Euvolemic. Patient is on torsemide as an outpatient. ?-Developed AKI, plan to hold torsemide.  Monitor volume status ? ?COPD (chronic obstructive pulmonary disease) ([place]) ?Patient is on albuterol and Breztri as an outpatient. ?-Continue with albuterol TID  ?-Continue Breztri ?-Incentive spirometry. ?-Report chronic shortness of breath. ? ?OBSTRUCTIVE SLEEP APNEA ?-Continue CPAP ? ?OBESITY ?Body mass index is 38.27 kg/m?Marland Kitchen  ?Needs lifestyle modification ? ?Hyperlipidemia associated with type 2 diabetes mellitus ([place]) ?-Continue Lipitor ? ?HYPOTHYROIDISM, POSTSURGICAL ?-Continue Synthroid 200 mcg ? ? ?Addendum\; Worsening hyperglycemia, CBG 500. Start Insulin gtt. Transfer to step down, transition to long acting insulin after CBG less than 150 ? ?  ? ?Subjective: she is alert, report chronic SOB.  ?She uses nebulizer at home.  ?She report knee pain, nurse will provide pain medications.  ? ? ?Physical Exam: ?Vitals:  ? 07/23/21 0946 07/23/21 1030 07/23/21 1338 07/23/21 1344  ?BP: (!) 157/65 (!) 150/73  (!) 160/62  ?Pulse: 62 66  60  ?Resp: '18 18  20  '$ ?Temp: 97.7 ?F (36.5 ?C) (!) 97.5 ?F (36.4 ?C)  97.6 ?F (36.4 ?C)  ?TempSrc:  Oral    ?SpO2: 99% 100% 100% 98%  ?Weight:      ?Height:      ? ?General; NAD ?Lungs; CTA no wheezing.  ?Abdomen; soft, nt, nd ?Extremity: right knee with drain and dressing in placed.  ? ?Data Reviewed: ? ?Cbc and  Bmet ? ?Family Communication: husband who was at bedside.  ? ?Disposition: ?Status is: Inpatient ?Remains inpatient appropriate because: needs IV antibiotic , develops AKI on CKD requiring IV fluids, nephrology consultation.  ? ? Planned Discharge Destination:  to be  determine.  ? ? ? ?Time spent: 45 minutes ? ?Author: ?Elmarie Shiley, MD ?07/23/2021 2:40 PM ? ?For on call review [email or web address].  ?

## 2021-07-23 NOTE — TOC Initial Note (Signed)
Transition of Care (TOC) - Initial/Assessment Note  ? ?Patient Details  ?Name: Kayla [name] ?MRN: [id number] ?Date of Birth: [birth year] ? ?Transition of Care (TOC) CM/SW Contact:    ?Sherie Don, LCSW ?Phone Number: ?07/23/2021, 12:14 PM ? ?Clinical Narrative: CSW met with patient and her husband to complete assessment and discuss discharge planning. Per patient, prior to her admission she was independent with ADLs at home and would like to go home with [facility] services if recommended rather than SNF, but will consider rehab if recommended by PT/OT. Patient has a tall walker and rollator at home. Patient is able to transport herself to medical appointments, but her husband assists as needed. TOC awaiting cultures for antibiotics and PT/OT recommendations. ? ?Expected Discharge Plan: [place] ?Barriers to Discharge: Continued Medical Work up ? ?Patient Goals and CMS Choice ?Patient states their goals for this hospitalization and ongoing recovery are:: Go home with [facility] or to a facility for rehab ?CMS Medicare.gov Compare Post Acute Care list provided to:: Patient ?Choice offered to / list presented to : Patient ? ?Expected Discharge Plan and Services ?Expected Discharge Plan: [place] ?In-house Referral: Clinical Social Work ?Living arrangements for the past 2 months: [place] ? ?Prior Living Arrangements/Services ?Living arrangements for the past 2 months: [place] ?Lives with:: Spouse ?Patient language and need for interpreter reviewed:: Yes ?Do you feel safe going back to the place where you live?: Yes      ?Need for Family Participation in Patient Care: No (Comment) ?Care giver support system in place?: Yes (comment) ?Current home services: DME (Tall walker, rollator) ?Criminal Activity/Legal Involvement Pertinent to Current Situation/Hospitalization: No - Comment as needed ? ?Activities of Daily Living ?Home Assistive Devices/Equipment: CPAP, Walker (specify  type), Cane (specify quad or straight) ?ADL Screening (condition at time of admission) ?Patient's cognitive ability adequate to safely complete daily activities?: Yes ?Is the patient deaf or have difficulty hearing?: No ?Does the patient have difficulty seeing, even when wearing glasses/contacts?: No ?Does the patient have difficulty concentrating, remembering, or making decisions?: Yes (memory problems) ?Patient able to express need for assistance with ADLs?: Yes ?Does the patient have difficulty dressing or bathing?: Yes ?Independently performs ADLs?: No ?Communication: Independent ?Dressing (OT): Independent ?Grooming: Independent ?Feeding: Independent ?Bathing: Needs assistance ?Is this a change from baseline?: Pre-admission baseline ?Toileting: Needs assistance ?Is this a change from baseline?: Pre-admission baseline ?In/Out Bed: Needs assistance ?Is this a change from baseline?: Pre-admission baseline ?Walks in Home: Dependent ?Is this a change from baseline?: Pre-admission baseline ?Does the patient have difficulty walking or climbing stairs?: Yes ?Weakness of Legs: Both ?Weakness of Arms/Hands: Both ? ?Emotional Assessment ?Appearance:: Appears stated age ?Attitude/Demeanor/Rapport: Engaged ?Affect (typically observed): Appropriate ?Orientation: : Oriented to Self, Oriented to Place, Oriented to  Time, Oriented to Situation ?Alcohol / Substance Use: Not Applicable ?Psych Involvement: No (comment) ? ?Admission diagnosis:  Prepatellar bursitis [M70.40] ?Septic bursitis [M71.10] ?Patient Active Problem List  ? Diagnosis Date Noted  ? Carotid bruit 07/07/2021  ? Septic prepatellar bursitis of right knee 05/30/2021  ? Cellulitis of right lower extremity 03/19/2021  ? Chronic kidney disease, stage 3b ([place]) 03/19/2021  ? Polymyalgia rheumatica ([place]) 04/15/2020  ? Temporal arteritis ([place]) 04/15/2020  ? Pacemaker - MDT 01/24/2020  ? Orthostatic lightheadedness 01/24/2020  ? Chronic osteomyelitis involving left ankle  and foot ([place])   ? Paroxysmal atrial fibrillation ([place]) 10/25/2019  ? Secondary hypercoagulable state ([place]) 10/25/2019  ? Numbness and  tingling of right leg 10/19/2019  ? Lumbar radiculopathy 10/19/2019  ? Chronic diastolic heart failure ([place]) 08/13/2019  ? Chest pain 08/13/2019  ? Leukocytosis 08/13/2019  ? Leg swelling 12/09/2018  ? Urinary frequency 12/09/2018  ? Pain in joint of right hip 01/21/2018  ? Myofascial pain syndrome, cervical 01/06/2018  ? Occipital neuralgia of right side 01/06/2018  ? RUQ abdominal pain   ? Gastritis and gastroduodenitis   ? Bile duct abnormality   ? Cholecystitis 04/30/2016  ? Gall bladder disease 04/28/2016  ? Sinus node dysfunction ([place]) 11/08/2015  ? Ejection fraction   ? Spinal stenosis of cervical region 11/07/2014  ? Preoperative clearance 10/09/2014  ? Carotid artery disease ([place])   ? S/P left TKA 07/27/2011  ? Dizzy spells 05/04/2011  ? Palpitations 01/21/2011  ? RHINITIS 07/21/2010  ? Hyperlipidemia associated with type 2 diabetes mellitus ([place]) 11/24/2008  ? OBESITY 11/24/2008  ? Coronary atherosclerosis 11/24/2008  ? VENTRICULAR HYPERTROPHY, LEFT 11/24/2008  ? HYPOTHYROIDISM, POSTSURGICAL 06/04/2008  ? Headache 06/04/2008  ? Insulin dependent type 2 diabetes mellitus ([place]) 03/23/2007  ? Esophageal reflux 03/23/2007  ? DIVERTICULOSIS OF COLON 03/23/2007  ? GENERALIZED OSTEOARTHROSIS UNSPECIFIED SITE 03/23/2007  ? OBSTRUCTIVE SLEEP APNEA 02/16/2007  ? Hypertension associated with diabetes ([place]) 02/16/2007  ? COPD (chronic obstructive pulmonary disease) ([place]) 02/16/2007  ? ?PCP:  [name], MD ?Pharmacy:   ?[id number]?[address]?[id number]?Phone: [phone]: [phone]? ?Readmission Risk Interventions ? ?  07/23/2021  ? 12:10 PM 03/21/2021  ? 12:53 PM  ?Readmission Risk Prevention Plan  ?Transportation Screening Complete Complete  ?Medication Review (Press photographer  Complete Complete  ?[facility] Consult Complete   ?SW Recovery Care/Counseling Consult Complete   ?Palliative Care Screening Not Applicable   ?Skilled Nursing Facility Complete   ? ? ? ?

## 2021-07-23 NOTE — Progress Notes (Signed)
Pt received onto unit by outgoing RN.  Pt moved to ICU bed, VSS.  Report given to oncoming nurse. ?

## 2021-07-23 NOTE — Progress Notes (Signed)
? ?Subjective: ?1 Day Post-Op Procedure(s) (LRB): ?INCISION AND DRAINAGE ABSCESS RIGHT KNEE (Right) ?Patient reports pain as mild.   ?Patient seen in rounds with Dr. Alvan Dame. ?Patient is well, and has had no acute complaints or problems. No acute events overnight. Foley catheter removed.  ?We will start therapy today.  ? ?Objective: ?Vital signs in last 24 hours: ?Temp:  [97.5 ?F (36.4 ?C)-99 ?F (37.2 ?C)] 97.5 ?F (36.4 ?C) (03/22 8366) ?Pulse Rate:  [60-64] 62 (03/22 0433) ?Resp:  [16-28] 16 (03/22 0433) ?BP: (105-158)/(55-90) 143/78 (03/22 0433) ?SpO2:  [92 %-99 %] 94 % (03/22 0736) ? ?Intake/Output from previous day: ? ?Intake/Output Summary (Last 24 hours) at 07/23/2021 0817 ?Last data filed at 07/23/2021 0757 ?Gross per 24 hour  ?Intake 2922.25 ml  ?Output 1901 ml  ?Net 1021.25 ml  ?  ? ?Intake/Output this shift: ?Total I/O ?In: -  ?Out: 250 [Urine:250] ? ?Labs: ?Recent Labs  ?  07/21/21 ?1421 07/22/21 ?2947 07/23/21 ?0331  ?HGB 10.8* 9.4* 9.2*  ? ?Recent Labs  ?  07/22/21 ?6546 07/23/21 ?0331  ?WBC 20.8* 14.8*  ?RBC 3.20* 3.17*  ?HCT 30.3* 29.8*  ?PLT 377 422*  ? ?Recent Labs  ?  07/22/21 ?5035 07/23/21 ?0331  ?NA 134* 134*  ?K 3.7 4.4  ?CL 101 102  ?CO2 22 21*  ?BUN 45* 58*  ?CREATININE 1.77* 2.25*  ?GLUCOSE 110* 334*  ?CALCIUM 7.8* 7.9*  ? ?Recent Labs  ?  07/21/21 ?1559  ?INR 1.6*  ? ? ?Exam: ?General - Patient is Alert and Oriented ?Extremity - Neurologically intact ?Sensation intact distally ?Intact pulses distally ?Dorsiflexion/Plantar flexion intact ?Hemovac drain in place.  ?Dressing - dressing C/D/I ?Motor Function - intact, moving foot and toes well on exam.  ? ?Past Medical History:  ?Diagnosis Date  ? Allergy   ? Anxiety   ? Arthritis   ? CAD (coronary artery disease)   ? Mild per remote cath in 2006, /    Nuclear, January, 2013, no significant abnormality,  ? Carotid artery disease ([place])   ? Doppler, January, 2013, 0 46% RIC A., 56-81% LICA, stable  ? Cataract   ? CHF (congestive heart failure) ([place])    ? Chronic kidney disease   ? Complication of anesthesia   ? wakes up "mean"  ? COPD 02/16/2007  ? Intolerant of Spiriva, tudorza Intolerant of Trelegy   ? COPD (chronic obstructive pulmonary disease) ([place])   ? Coronary atherosclerosis 11/24/2008  ? Catheterization 2006, nonobstructive coronary disease   //   nuclear 2013, low risk    ? Diabetes mellitus, type 2 ([place])   ? Diverticula of colon   ? DIVERTICULOSIS OF COLON 03/23/2007  ? Qualifier: Diagnosis of  By: Halford Chessman MD, Vineet    ? Dizziness   ? occasional  ? Dizzy spells 05/04/2011  ? DM (diabetes mellitus) ([place]) 03/23/2007  ? Qualifier: Diagnosis of  By: Halford Chessman MD, Vineet    ? Dyspnea   ? On exertion  ? Ejection fraction   ? Essential hypertension 02/16/2007  ? Qualifier: Diagnosis of  By: Rosana Hoes CMA, Tammy    ? G E R D 03/23/2007  ? Qualifier: Diagnosis of  By: Halford Chessman MD, Vineet    ? Gall bladder disease 04/28/2016  ? Gastritis and gastroduodenitis   ? GERD (gastroesophageal reflux disease)   ? Goiter   ? hx of  ? Headache(784.0)   ? migraine  ? Hyperlipidemia   ? Hypertension   ? Hypothyroidism   ? HYPOTHYROIDISM, POSTSURGICAL  06/04/2008  ? Qualifier: Diagnosis of  By: Loanne Drilling MD, Jacelyn Pi   ? Left ventricular hypertrophy   ? Leg edema   ? occasional swelling  ? Leg swelling 12/09/2018  ? Myofascial pain syndrome, cervical 01/06/2018  ? Obesity   ? OBESITY 11/24/2008  ? Qualifier: Diagnosis of  By: Sidney Ace    ? OBSTRUCTIVE SLEEP APNEA 02/16/2007  ? Auto CPAP 09/03/13 to 10/02/13 >> used on 30 of 30 nights with average 6 hrs and 3 min.  Average AHI is 3.1 with median CPAP 5 cm H2O and 95 th percentile CPAP 6 cm H2O.    ? Occipital neuralgia of right side 01/06/2018  ? OSA (obstructive sleep apnea)   ? cpap setting of 12  ? Pain in joint of right hip 01/21/2018  ? Palpitations 01/21/2011  ? 48 hour Holter, 2015, scattered PACs and PVCs.   ? Paroxysmal atrial fibrillation (HCC)   ? Pneumonia   ? PONV (postoperative nausea and vomiting)   ? severe after every surgery   ? Pulmonary hypertension, secondary   ? RHINITIS 07/21/2010  ?     ? RUQ abdominal pain   ? S/P left TKA 07/27/2011  ? Sinus node dysfunction ([place]) 11/08/2015  ? Sleep apnea   ? Spinal stenosis of cervical region 11/07/2014  ? Urinary frequency 12/09/2018  ? UTI (urinary tract infection) 08/15/2019  ? per pt report  ? VENTRICULAR HYPERTROPHY, LEFT 11/24/2008  ? Qualifier: Diagnosis of  By: Sidney Ace    ? ? ?Assessment/Plan: ?1 Day Post-Op Procedure(s) (LRB): ?INCISION AND DRAINAGE ABSCESS RIGHT KNEE (Right) ?Principal Problem: ?  Septic prepatellar bursitis of right knee ?Active Problems: ?  HYPOTHYROIDISM, POSTSURGICAL ?  Insulin dependent type 2 diabetes mellitus ([place]) ?  Hyperlipidemia associated with type 2 diabetes mellitus ([place]) ?  OBESITY ?  OBSTRUCTIVE SLEEP APNEA ?  Hypertension associated with diabetes ([place]) ?  Coronary atherosclerosis ?  COPD (chronic obstructive pulmonary disease) ([place]) ?  Chronic diastolic heart failure ([place]) ?  Paroxysmal atrial fibrillation (HCC) ?  Pacemaker - MDT ?  Chronic kidney disease, stage 3b ([place]) ? ?Estimated body mass index is 38.27 kg/m? as calculated from the following: ?  Height as of this encounter: '5\' 5"'$  (1.651 m). ?  Weight as of this encounter: 104.3 kg. ?Advance diet ?Up with therapy  ? ?DVT Prophylaxis - Aspirin ?Weight bearing as tolerated. ? ?Cr. Is elevated today from 1.77>2.25 ? ?Hgb stable at 9.4 this AM.  ? ?We will leave hemovac drain in today, and likely pull tomorrow.  ?Get up with PT/RN as able. ? ?Disposition per PT recs and medical team input. ? ? ?Griffith Citron, PA-C ?Orthopedic Surgery ?[phone] ?07/23/2021, 8:17 AM  ?

## 2021-07-23 NOTE — Progress Notes (Signed)
Pt noted to have a CBG of 449.  Dr. Tyrell Antonio made aware.  New orders received for stat BMET and 18U of insulin.  Pt remains alert and oriented.  Critical lab value received for blood glucose of 524.  Dr. Jen Mow made aware.  New orders received to transfer pt to the step down unit for the need of an insulin drip.  Report given to [facility].  All belongings take with pt to new room.  ?

## 2021-07-23 NOTE — Evaluation (Signed)
Physical Therapy Evaluation ?Patient Details ?Name: Kayla [name] ?MRN: [id number] ?DOB: 04-04-1941 ?Today's Date: 07/23/2021 ? ?History of Present Illness ? 81 yo female admitted with R knee septic bursitis. s/p I&D 07/22/21. Recent admission for same, S/P I&D R knee 05/30/21. Hx of obesity, DM, OSA, COPD, HF, CKD, cellultis, L great toe amputation, L TKA, chronic pain, CAD  ?Clinical Impression ? Pt admitted with above diagnosis. Min assist for supine to sit, min assist to pivot to recliner, activity tolerance limited by R knee pain and generalize weakness/fatigue.  Pt currently with functional limitations due to the deficits listed below (see PT Problem List). Pt will benefit from skilled PT to increase their independence and safety with mobility to allow discharge to the venue listed below.   ?   ?   ? ?Recommendations for follow up therapy are one component of a multi-disciplinary discharge planning process, led by the attending physician.  Recommendations may be updated based on patient status, additional functional criteria and insurance authorization. ? ?Follow Up Recommendations Follow physician's recommendations for discharge plan and follow up therapies ? ?  ?Assistance Recommended at Discharge Intermittent Supervision/Assistance  ?Patient can return home with the following ? Help with stairs or ramp for entrance;Assist for transportation;A little help with bathing/dressing/bathroom;Assistance with cooking/housework ? ?  ?Equipment Recommendations None recommended by PT  ?Recommendations for Other Services ?    ?  ?Functional Status Assessment Patient has had a recent decline in their functional status and demonstrates the ability to make significant improvements in function in a reasonable and predictable amount of time.  ? ?  ?Precautions / Restrictions Precautions ?Precautions: Fall ?Precaution Comments: 1 fall 5-6 months ago ?Restrictions ?Weight Bearing Restrictions: No ?RLE Weight Bearing: Weight  bearing as tolerated  ? ?  ? ?Mobility ? Bed Mobility ?Overal bed mobility: Needs Assistance ?Bed Mobility: Supine to Sit ?  ?  ?Supine to sit: Min assist ?  ?  ?General bed mobility comments: min A to raise trunk, increased time ?  ? ?Transfers ?Overall transfer level: Needs assistance ?Equipment used: Rolling walker (2 wheels) ?Transfers: Sit to/from Stand, Bed to chair/wheelchair/BSC ?Sit to Stand: Min assist, +2 safety/equipment ?  ?Step pivot transfers: Min assist, +2 safety/equipment ?  ?  ?  ?General transfer comment: min A to power up, trembling and mildly unsteady in standing; assisted pt to transfer to recliner, fatigue/pain limiting activity tolerance ?  ? ?Ambulation/Gait ?  ?  ?  ?  ?  ?  ?  ?General Gait Details: unable 2* fatigue, weakness, pain ? ?Stairs ?  ?  ?  ?  ?  ? ?Wheelchair Mobility ?  ? ?Modified Rankin (Stroke Patients Only) ?  ? ?  ? ?Balance Overall balance assessment: Mild deficits observed, not formally tested ?  ?  ?  ?  ?  ?  ?  ?  ?  ?  ?  ?  ?  ?  ?  ?  ?  ?  ?   ? ? ? ?Pertinent Vitals/Pain Pain Assessment ?Pain Score: 9  ?Pain Location: R knee ?Pain Descriptors / Indicators: Sore ?Pain Intervention(s): Limited activity within patient's tolerance, Monitored during session, Premedicated before session, Repositioned  ? ? ?Home Living Family/patient expects to be discharged to:: Private residence ?Living Arrangements: Spouse/significant other ?Available Help at Discharge: Family;Available 24 hours/day ?Type of Home: House ?Home Access: Stairs to enter ?Entrance Stairs-Rails: Right ?Entrance Stairs-Number of Steps: 2 ?  ?Home Layout: One level ?Home Equipment: Rollator (  4 wheels);Rolling Walker (2 wheels);Cane - single point;Shower seat ?   ?  ?Prior Function Prior Level of Function : Needs assist ?  ?  ?  ?  ?  ?  ?Mobility Comments: Uses a walker for mobility ?ADLs Comments: Husband assists with donning socks and shoes as well as tub transfers. husband performs majority of IADLs ?   ? ? ?Hand Dominance  ? Dominant Hand: Right ? ?  ?Extremity/Trunk Assessment  ? Upper Extremity Assessment ?Upper Extremity Assessment: Overall WFL for tasks assessed ?  ? ?Lower Extremity Assessment ?Lower Extremity Assessment: RLE deficits/detail ?RLE Deficits / Details: not able to fully WB on RLE in standing 2* pain; B feet numb to light touch 2* diabetic neuropathy per pt ?RLE: Unable to fully assess due to pain ?RLE Sensation: history of peripheral neuropathy;decreased light touch ?  ? ?Cervical / Trunk Assessment ?Cervical / Trunk Assessment: Normal  ?Communication  ? Communication: No difficulties  ?Cognition Arousal/Alertness: Awake/alert ?Behavior During Therapy: [facility] for tasks assessed/performed ?Overall Cognitive Status: Within Functional Limits for tasks assessed ?  ?  ?  ?  ?  ?  ?  ?  ?  ?  ?  ?  ?  ?  ?  ?  ?  ?  ?  ? ?  ?General Comments   ? ?  ?Exercises    ? ?Assessment/Plan  ?  ?PT Assessment Patient needs continued PT services  ?PT Problem List Decreased strength;Decreased activity tolerance;Decreased balance;Pain;Impaired sensation;Decreased mobility;Obesity ? ?   ?  ?PT Treatment Interventions DME instruction;Gait training;Therapeutic activities;Therapeutic exercise;Patient/family education;Balance training;Functional mobility training;Stair training   ? ?PT Goals (Current goals can be found in the Care Plan section)  ?Acute Rehab PT Goals ?Patient Stated Goal: home ?PT Goal Formulation: With patient/family ?Time For Goal Achievement: 08/06/21 ?Potential to Achieve Goals: Good ? ?  ?Frequency Min 3X/week ?  ? ? ?Co-evaluation   ?  ?  ?  ?  ? ? ?  ?AM-PAC PT "6 Clicks" Mobility  ?Outcome Measure Help needed turning from your back to your side while in a flat bed without using bedrails?: A Little ?Help needed moving from lying on your back to sitting on the side of a flat bed without using bedrails?: A Little ?Help needed moving to and from a bed to a chair (including a wheelchair)?: A  Little ?Help needed standing up from a chair using your arms (e.g., wheelchair or bedside chair)?: A Little ?Help needed to walk in hospital room?: A Lot ?Help needed climbing 3-5 steps with a railing? : Total ?6 Click Score: 15 ? ?  ?End of Session Equipment Utilized During Treatment: Gait belt;Oxygen ?Activity Tolerance: Patient limited by pain;Patient limited by fatigue ?Patient left: in chair;with call bell/phone within reach;with chair alarm set ?Nurse Communication: Mobility status ?PT Visit Diagnosis: Pain;Difficulty in walking, not elsewhere classified (R26.2);Muscle weakness (generalized) (M62.81) ?Pain - Right/Left: Right ?Pain - part of body: Knee ?  ? ?Time: 8527-7824 ?PT Time Calculation (min) (ACUTE ONLY): 17 min ? ? ?Charges:   PT Evaluation ?$PT Eval Moderate Complexity: 1 Mod ?  ?  ?   ? ? ?Blondell Reveal Kistler PT 07/23/2021  ?Acute Rehabilitation Services ?Pager [phone] ?Office [phone] ? ? ?

## 2021-07-23 NOTE — Plan of Care (Signed)

## 2021-07-24 ENCOUNTER — Inpatient Hospital Stay (HOSPITAL_COMMUNITY): Payer: Medicare HMO

## 2021-07-24 DIAGNOSIS — M71161 Other infective bursitis, right knee: Secondary | ICD-10-CM | POA: Diagnosis not present

## 2021-07-24 LAB — BASIC METABOLIC PANEL
Anion gap: 9 (ref 5–15)
Anion gap: 12 (ref 5–15)
Anion gap: 11 (ref 5–15)
BUN: 60 mg/dL — ABNORMAL HIGH (ref 8–23)
BUN: 62 mg/dL — ABNORMAL HIGH (ref 8–23)
BUN: 65 mg/dL — ABNORMAL HIGH (ref 8–23)
CO2: 22 mmol/L (ref 22–32)
CO2: 20 mmol/L — ABNORMAL LOW (ref 22–32)
CO2: 22 mmol/L (ref 22–32)
Calcium: 8.7 mg/dL — ABNORMAL LOW (ref 8.9–10.3)
Calcium: 8.8 mg/dL — ABNORMAL LOW (ref 8.9–10.3)
Calcium: 8.5 mg/dL — ABNORMAL LOW (ref 8.9–10.3)
Chloride: 103 mmol/L (ref 98–111)
Chloride: 102 mmol/L (ref 98–111)
Chloride: 101 mmol/L (ref 98–111)
Creatinine, Ser: 2 mg/dL — ABNORMAL HIGH (ref 0.44–1.00)
Creatinine, Ser: 2.05 mg/dL — ABNORMAL HIGH (ref 0.44–1.00)
Creatinine, Ser: 2.02 mg/dL — ABNORMAL HIGH (ref 0.44–1.00)
GFR, Estimated: 25 mL/min — ABNORMAL LOW (ref >60)
GFR, Estimated: 24 mL/min — ABNORMAL LOW (ref >60)
GFR, Estimated: 24 mL/min — ABNORMAL LOW (ref >60)
Glucose, Bld: 164 mg/dL — ABNORMAL HIGH (ref 70–99)
Glucose, Bld: 175 mg/dL — ABNORMAL HIGH (ref 70–99)
Glucose, Bld: 216 mg/dL — ABNORMAL HIGH (ref 70–99)
Potassium: 4.8 mmol/L (ref 3.5–5.1)
Potassium: 4.6 mmol/L (ref 3.5–5.1)
Potassium: 4.2 mmol/L (ref 3.5–5.1)
Sodium: 134 mmol/L — ABNORMAL LOW (ref 135–145)
Sodium: 134 mmol/L — ABNORMAL LOW (ref 135–145)
Sodium: 134 mmol/L — ABNORMAL LOW (ref 135–145)

## 2021-07-24 LAB — GLUCOSE, CAPILLARY
Glucose-Capillary: 165 mg/dL — ABNORMAL HIGH (ref 70–99)
Glucose-Capillary: 165 mg/dL — ABNORMAL HIGH (ref 70–99)
Glucose-Capillary: 166 mg/dL — ABNORMAL HIGH (ref 70–99)
Glucose-Capillary: 175 mg/dL — ABNORMAL HIGH (ref 70–99)
Glucose-Capillary: 178 mg/dL — ABNORMAL HIGH (ref 70–99)
Glucose-Capillary: 185 mg/dL — ABNORMAL HIGH (ref 70–99)
Glucose-Capillary: 198 mg/dL — ABNORMAL HIGH (ref 70–99)
Glucose-Capillary: 208 mg/dL — ABNORMAL HIGH (ref 70–99)
Glucose-Capillary: 215 mg/dL — ABNORMAL HIGH (ref 70–99)
Glucose-Capillary: 264 mg/dL — ABNORMAL HIGH (ref 70–99)
Glucose-Capillary: 339 mg/dL — ABNORMAL HIGH (ref 70–99)
Glucose-Capillary: 443 mg/dL — ABNORMAL HIGH (ref 70–99)

## 2021-07-24 LAB — CBC
HCT: 34.7 % — ABNORMAL LOW (ref 36.0–46.0)
Hemoglobin: 11 g/dL — ABNORMAL LOW (ref 12.0–15.0)
MCH: 28.9 pg (ref 26.0–34.0)
MCHC: 31.7 g/dL (ref 30.0–36.0)
MCV: 91.3 fL (ref 80.0–100.0)
Platelets: 444 10*3/uL — ABNORMAL HIGH (ref 150–400)
RBC: 3.8 MIL/uL — ABNORMAL LOW (ref 3.87–5.11)
RDW: 15.8 % — ABNORMAL HIGH (ref 11.5–15.5)
WBC: 15 10*3/uL — ABNORMAL HIGH (ref 4.0–10.5)
nRBC: 0.2 % (ref 0.0–0.2)

## 2021-07-24 LAB — CK: Total CK: 83 U/L (ref 38–234)

## 2021-07-24 MED ORDER — INSULIN ASPART 100 UNIT/ML IJ SOLN: 0.0000 [IU] | Freq: Every day | INTRAMUSCULAR | Status: DC

## 2021-07-24 MED ORDER — AMLODIPINE BESYLATE 10 MG PO TABS
10.0000 mg | ORAL_TABLET | Freq: Every day | ORAL | Status: DC
  Administered 2021-07-25 – 2021-07-27 (×3): 10 mg via ORAL
  Filled 2021-07-24 (×3): qty 1

## 2021-07-24 MED ORDER — INSULIN ASPART 100 UNIT/ML IJ SOLN
8.0000 [IU] | Freq: Three times a day (TID) | INTRAMUSCULAR | Status: DC
  Administered 2021-07-24 – 2021-07-25 (×2): 8 [IU] via SUBCUTANEOUS

## 2021-07-24 MED ORDER — FERROUS SULFATE 325 (65 FE) MG PO TABS
325.0000 mg | ORAL_TABLET | Freq: Every day | ORAL | Status: DC
  Administered 2021-07-25 – 2021-07-27 (×3): 325 mg via ORAL
  Filled 2021-07-24 (×3): qty 1

## 2021-07-24 MED ORDER — HYDRALAZINE HCL 20 MG/ML IJ SOLN
10.0000 mg | Freq: Four times a day (QID) | INTRAMUSCULAR | Status: DC | PRN
  Filled 2021-07-24: qty 1

## 2021-07-24 MED ORDER — GUAIFENESIN-DM 100-10 MG/5ML PO SYRP
5.0000 mL | ORAL_SOLUTION | ORAL | Status: DC | PRN
  Administered 2021-07-24 – 2021-07-25 (×2): 5 mL via ORAL
  Filled 2021-07-24 (×2): qty 5

## 2021-07-24 MED ORDER — INSULIN ASPART 100 UNIT/ML IJ SOLN
0.0000 [IU] | Freq: Three times a day (TID) | INTRAMUSCULAR | Status: DC
  Administered 2021-07-24: 15 [IU] via SUBCUTANEOUS
  Administered 2021-07-24: 4 [IU] via SUBCUTANEOUS
  Administered 2021-07-24 – 2021-07-25 (×2): 11 [IU] via SUBCUTANEOUS
  Administered 2021-07-25: 7 [IU] via SUBCUTANEOUS
  Administered 2021-07-25: 4 [IU] via SUBCUTANEOUS
  Administered 2021-07-26 (×2): 7 [IU] via SUBCUTANEOUS
  Administered 2021-07-26: 4 [IU] via SUBCUTANEOUS
  Administered 2021-07-27: 3 [IU] via SUBCUTANEOUS

## 2021-07-24 MED ORDER — AMLODIPINE BESYLATE 5 MG PO TABS
5.0000 mg | ORAL_TABLET | Freq: Once | ORAL | Status: AC
  Administered 2021-07-24: 5 mg via ORAL
  Filled 2021-07-24: qty 1

## 2021-07-24 MED ORDER — INSULIN GLARGINE-YFGN 100 UNIT/ML ~~LOC~~ SOLN
50.0000 [IU] | Freq: Two times a day (BID) | SUBCUTANEOUS | Status: DC
  Administered 2021-07-24 (×2): 50 [IU] via SUBCUTANEOUS
  Filled 2021-07-24 (×4): qty 0.5

## 2021-07-24 MED ORDER — INSULIN ASPART 100 UNIT/ML IJ SOLN
6.0000 [IU] | Freq: Three times a day (TID) | INTRAMUSCULAR | Status: DC
  Administered 2021-07-24 (×2): 6 [IU] via SUBCUTANEOUS

## 2021-07-24 MED ORDER — SALINE SPRAY 0.65 % NA SOLN
1.0000 | NASAL | Status: DC | PRN
  Administered 2021-07-24: 1 via NASAL
  Filled 2021-07-24: qty 44

## 2021-07-24 NOTE — Consult Note (Signed)
?  [facility] for Infectious Disease  Total days of antibiotics 3 ?        ?Referring Physician: regalado ?Problem: right prepatellar septic bursitis ? ?Principal Problem: ?  Septic prepatellar bursitis of right knee ?Active Problems: ?  HYPOTHYROIDISM, POSTSURGICAL ?  Insulin dependent type 2 diabetes mellitus ([place]) ?  Hyperlipidemia associated with type 2 diabetes mellitus ([place]) ?  OBESITY ?  OBSTRUCTIVE SLEEP APNEA ?  Hypertension associated with diabetes ([place]) ?  Coronary atherosclerosis ?  COPD (chronic obstructive pulmonary disease) ([place]) ?  Chronic diastolic heart failure ([place]) ?  Paroxysmal atrial fibrillation (HCC) ?  Pacemaker - MDT ?  Chronic kidney disease, stage 3b ([place]) ? ? ? ?HPI: Kayla [name] is a 81 y.o. female with hx of CKD#, pAF, pacemaker who initially developed right knee swelling and erythema, found to have MRSE on aspiration of pre-patellar bursitis. She was admitted on 1/27 for washout and discharged on 2 wk of doxycycline, which was extended for additional 10 days due to lack of improvement. She reports her knee not tremendously improved over the course of February. It had steadily became more painful, swollen, difficulty to weight bear. She was seen at dr olin's office where they performed repeat arthrocentesis, with fluid appearing purulent, thus was directly admitted for wash out, where she underwent debridement, exicision of non-viable tissue on 3/21. Admit labs were remarkable for WBC of 27.8K and plt of 460. She was empirically started on vancomycin and her cr worsening while on vancomycin. She is MRSA/MSSA colonized. OR cx growing staph aureus but sensitivities are pending. Blood cx are NGTD as of 3/20.  ? ?Past Medical History:  ?Diagnosis Date  ? Allergy   ? Anxiety   ? Arthritis   ? CAD (coronary artery disease)   ? Mild per remote cath in 2006, /    Nuclear, January, 2013, no significant abnormality,  ? Carotid artery disease ([place])   ? Doppler, January, 2013, 0 39% RIC  A., 37-34% LICA, stable  ? Cataract   ? CHF (congestive heart failure) ([place])   ? Chronic kidney disease   ? Complication of anesthesia   ? wakes up "mean"  ? COPD 02/16/2007  ? Intolerant of Spiriva, tudorza Intolerant of Trelegy   ? COPD (chronic obstructive pulmonary disease) ([place])   ? Coronary atherosclerosis 11/24/2008  ? Catheterization 2006, nonobstructive coronary disease   //   nuclear 2013, low risk    ? Diabetes mellitus, type 2 ([place])   ? Diverticula of colon   ? DIVERTICULOSIS OF COLON 03/23/2007  ? Qualifier: Diagnosis of  By: Halford Chessman MD, Vineet    ? Dizziness   ? occasional  ? Dizzy spells 05/04/2011  ? DM (diabetes mellitus) ([place]) 03/23/2007  ? Qualifier: Diagnosis of  By: Halford Chessman MD, Vineet    ? Dyspnea   ? On exertion  ? Ejection fraction   ? Essential hypertension 02/16/2007  ? Qualifier: Diagnosis of  By: Rosana Hoes CMA, Tammy    ? G E R D 03/23/2007  ? Qualifier: Diagnosis of  By: Halford Chessman MD, Vineet    ? Gall bladder disease 04/28/2016  ? Gastritis and gastroduodenitis   ? GERD (gastroesophageal reflux disease)   ? Goiter   ? hx of  ? Headache(784.0)   ? migraine  ? Hyperlipidemia   ? Hypertension   ? Hypothyroidism   ? HYPOTHYROIDISM, POSTSURGICAL 06/04/2008  ? Qualifier: Diagnosis of  By: Loanne Drilling MD, Jacelyn Pi   ? Left ventricular hypertrophy   ?  Leg edema   ? occasional swelling  ? Leg swelling 12/09/2018  ? Myofascial pain syndrome, cervical 01/06/2018  ? Obesity   ? OBESITY 11/24/2008  ? Qualifier: Diagnosis of  By: Sidney Ace    ? OBSTRUCTIVE SLEEP APNEA 02/16/2007  ? Auto CPAP 09/03/13 to 10/02/13 >> used on 30 of 30 nights with average 6 hrs and 3 min.  Average AHI is 3.1 with median CPAP 5 cm H2O and 95 th percentile CPAP 6 cm H2O.    ? Occipital neuralgia of right side 01/06/2018  ? OSA (obstructive sleep apnea)   ? cpap setting of 12  ? Pain in joint of right hip 01/21/2018  ? Palpitations 01/21/2011  ? 48 hour Holter, 2015, scattered PACs and PVCs.   ? Paroxysmal atrial fibrillation (HCC)   ?  Pneumonia   ? PONV (postoperative nausea and vomiting)   ? severe after every surgery  ? Pulmonary hypertension, secondary   ? RHINITIS 07/21/2010  ?     ? RUQ abdominal pain   ? S/P left TKA 07/27/2011  ? Sinus node dysfunction ([place]) 11/08/2015  ? Sleep apnea   ? Spinal stenosis of cervical region 11/07/2014  ? Urinary frequency 12/09/2018  ? UTI (urinary tract infection) 08/15/2019  ? per pt report  ? VENTRICULAR HYPERTROPHY, LEFT 11/24/2008  ? Qualifier: Diagnosis of  By: Sidney Ace    ? ? ?Allergies:  ?Allergies  ?Allergen Reactions  ? Dulaglutide Nausea Only, Other (See Comments) and Cough  ?  Made the patient sick to her stomach (only) several days after using it  ? Oxycodone Itching and Other (See Comments)  ?  Pt still takes this med even thought it causes itching  ? Trelegy Ellipta [Fluticasone-Umeclidin-Vilant] Nausea Only, Other (See Comments) and Cough  ?  Made the patient sick to her stomach (only) several days after using it   ? ? ?Current antibiotics: ? ? ?MEDICATIONS: ? albuterol  2.5 mg Nebulization TID  ? allopurinol  100 mg Oral BID  ? amiodarone  200 mg Oral QHS  ? amLODipine  5 mg Oral Daily  ? apixaban  2.5 mg Oral BID  ? Budeson-Glycopyrrol-Formoterol  2 puff Inhalation BID  ? chlorhexidine  15 mL Mouth Rinse BID  ? Chlorhexidine Gluconate Cloth  6 each Topical Daily  ? docusate sodium  100 mg Oral BID  ? ferrous sulfate  325 mg Oral TID PC  ? fluticasone  2 spray Each Nare Daily  ? gabapentin  300 mg Oral TID  ? insulin aspart  0-20 Units Subcutaneous TID WC  ? insulin aspart  0-5 Units Subcutaneous QHS  ? insulin aspart  6 Units Subcutaneous TID WC  ? insulin glargine-yfgn  50 Units Subcutaneous BID  ? levothyroxine  200 mcg Oral QAC breakfast  ? mouth rinse  15 mL Mouth Rinse q12n4p  ? metoprolol succinate  100 mg Oral Daily  ? multivitamin with minerals  1 tablet Oral Daily  ? mupirocin ointment  1 application. Nasal BID  ? ? ?Social History  ? ?Tobacco Use  ? Smoking status: Former  ?   Packs/day: 2.00  ?  Years: 33.00  ?  Pack years: 66.00  ?  Types: Cigarettes  ?  Start date: 1966  ?  Quit date: 05/04/1997  ?  Years since quitting: 24.2  ? Smokeless tobacco: Never  ?Vaping Use  ? Vaping Use: Never used  ?Substance Use Topics  ? Alcohol use: No  ? Drug use:  No  ?-retired Medical illustrator. ? ?Family History  ?Problem Relation Age of Onset  ? Heart Problems Mother   ? Heart defect Mother   ?     valve problems  ? Heart Problems Father   ? Heart disease Brother   ? Lung cancer Daughter   ?     non small cell   ? Diabetes Maternal Aunt   ? Diabetes Maternal Uncle   ? Throat cancer Brother   ? Colon cancer Neg Hx   ? Colon polyps Neg Hx   ? Esophageal cancer Neg Hx   ? Rectal cancer Neg Hx   ? Stomach cancer Neg Hx   ? ? ? ?Review of Systems  ?Constitutional: Negative for fever, positive for chills, diaphoresis, activity change, appetite change, fatigue and unexpected weight change.  ?HENT: Negative for congestion, sore throat, rhinorrhea, sneezing, trouble swallowing and sinus pressure.  ?Eyes: Negative for photophobia and visual disturbance.  ?Respiratory: Negative for cough, chest tightness, shortness of breath, wheezing and stridor.  ?Cardiovascular: Negative for chest pain, palpitations and leg swelling.  ?Gastrointestinal: Negative for nausea, vomiting, abdominal pain, diarrhea, constipation, blood in stool, abdominal distention and anal bleeding.  ?Genitourinary: Negative for dysuria, hematuria, flank pain and difficulty urinating.  ?Musculoskeletal: positive for right knee pain.  Negative for myalgias, back pain, joint swelling, arthralgias and gait problem.  ?Skin: Negative for color change, pallor, rash and wound.  ?Neurological: Negative for dizziness, tremors, weakness and light-headedness.  ?Hematological: Negative for adenopathy. Does not bruise/bleed easily.  ?Psychiatric/Behavioral: Negative for behavioral problems, confusion, sleep disturbance, dysphoric mood, decreased concentration and  agitation.  ? ? ?OBJECTIVE: ?Temp:  [96.5 ?F (35.8 ?C)-97.7 ?F (36.5 ?C)] 97.7 ?F (36.5 ?C) (03/23 3559) ?Pulse Rate:  [59-69] 69 (03/23 0848) ?Resp:  [11-21] 12 (03/23 0848) ?BP: (127-202)/(45-78) 138/49

## 2021-07-24 NOTE — Progress Notes (Signed)
?  [facility]ious [facility]   ? ?Date of Admission:  07/21/2021   Total days of antibiotics 4 ?        ? ? ?ID: Kayla [name] is a 81 y.o. female with staph aureus right knee prepatellar bursitis ?Principal Problem: ?  Septic prepatellar bursitis of right knee ?Active Problems: ?  HYPOTHYROIDISM, POSTSURGICAL ?  Insulin dependent type 2 diabetes mellitus ([place]) ?  Hyperlipidemia associated with type 2 diabetes mellitus ([place]) ?  OBESITY ?  OBSTRUCTIVE SLEEP APNEA ?  Hypertension associated with diabetes ([place]) ?  Coronary atherosclerosis ?  COPD (chronic obstructive pulmonary disease) ([place]) ?  Chronic diastolic heart failure ([place]) ?  Paroxysmal atrial fibrillation (HCC) ?  Pacemaker - MDT ?  Chronic kidney disease, stage 3b ([place]) ? ? ? ?Subjective: ?Afebrile but had encephalopathy in the setting of hyperglycemia last night required insulin gtt in icu but now improved ? ?Medications:  ? albuterol  2.5 mg Nebulization TID  ? allopurinol  100 mg Oral BID  ? amiodarone  200 mg Oral QHS  ? amLODipine  5 mg Oral Daily  ? apixaban  2.5 mg Oral BID  ? Budeson-Glycopyrrol-Formoterol  2 puff Inhalation BID  ? chlorhexidine  15 mL Mouth Rinse BID  ? Chlorhexidine Gluconate Cloth  6 each Topical Daily  ? docusate sodium  100 mg Oral BID  ? [START ON 07/25/2021] ferrous sulfate  325 mg Oral Q breakfast  ? fluticasone  2 spray Each Nare Daily  ? gabapentin  300 mg Oral TID  ? insulin aspart  0-20 Units Subcutaneous TID WC  ? insulin aspart  0-5 Units Subcutaneous QHS  ? insulin aspart  8 Units Subcutaneous TID WC  ? insulin glargine-yfgn  50 Units Subcutaneous BID  ? levothyroxine  200 mcg Oral QAC breakfast  ? mouth rinse  15 mL Mouth Rinse q12n4p  ? metoprolol succinate  100 mg Oral Daily  ? multivitamin with minerals  1 tablet Oral Daily  ? mupirocin ointment  1 application. Nasal BID  ? ? ?Objective: ?Vital signs in last 24 hours: ?Temp:  [96.5 ?F (35.8 ?C)-97.7 ?F (36.5 ?C)] 97.7 ?F (36.5 ?C) (03/23 2703) ?Pulse  Rate:  [59-73] 64 (03/23 1500) ?Resp:  [11-26] 20 (03/23 1500) ?BP: (127-202)/(44-78) 167/56 (03/23 1400) ?SpO2:  [95 %-100 %] 97 % (03/23 1500) ?FiO2 (%):  [32 %] 32 % (03/23 0848) ?Weight:  [110.5 kg] 110.5 kg (03/23 0813) ?Physical Exam  ?Constitutional:  oriented to person, place, and time. appears well-developed and well-nourished. No distress.  ?HENT: [place]/AT, PERRLA, no scleral icterus ?Mouth/Throat: Oropharynx is clear and moist. No oropharyngeal exudate.  ?Cardiovascular: Normal rate, regular rhythm and normal heart sounds. Exam reveals no gallop and no friction rub.  ?No murmur heard.  ?Pulmonary/Chest: Effort normal and breath sounds normal. No respiratory distress.  has no wheezes.  ?Neck = supple, no nuchal rigidity ?Abdominal: Soft. Bowel sounds are normal.  exhibits no distension. There is no tenderness.  ?JKK:XFGHW knee wrapped/ some cellulitis to anterior tibial region ?Neurological: alert and oriented to person, place, and time.  ?Skin: Skin is warm and dry. No rash noted. No erythema.  ?Psychiatric: a normal mood and affect.  behavior is normal.  ? ? ?Lab Results ?Recent Labs  ?  07/23/21 ?0331 07/23/21 ?1713 07/24/21 ?0312 07/24/21 ?0616 07/24/21 ?1001  ?WBC 14.8*  --  15.0*  --   --   ?HGB 9.2*  --  11.0*  --   --   ?  HCT 29.8*  --  34.7*  --   --   ?NA 134*   < > 134* 134* 134*  ?K 4.4   < > 4.8 4.6 4.2  ?CL 102   < > 103 102 101  ?CO2 21*   < > 22 20* 22  ?BUN 58*   < > 60* 62* 65*  ?CREATININE 2.25*   < > 2.00* 2.05* 2.02*  ? < > = values in this interval not displayed.  ? ? ? ?Microbiology: ?reviewed ?Studies/Results: ?US RENAL ? ?Result Date: 07/24/2021 ?CLINICAL DATA:  Acute renal failure. EXAM: RENAL / URINARY TRACT ULTRASOUND COMPLETE COMPARISON:  December 23, 2020. FINDINGS: Right Kidney: Renal measurements: 12.7 x 5.7 x 5.5 cm = volume: 206 mL. Echogenicity within normal limits. No mass or hydronephrosis visualized. Left Kidney: Renal measurements: 11.3 x 5.7 x 5.1 cm = volume: 170 mL. Two  simple cysts are noted, the largest measuring 2.6 cm. Echogenicity within normal limits. No mass or hydronephrosis visualized. Bladder: Appears normal for degree of bladder distention. Other: None. IMPRESSION: Simple left renal cysts.  No other renal abnormality is noted. Electronically Signed   By: Marijo Conception M.D.   On: 07/24/2021 08:07  ? ?DG CHEST PORT 1 VIEW ? ?Result Date: 07/23/2021 ?CLINICAL DATA:  A female age age 55 presents for evaluation of increased shortness of breath and cough for 1 day. EXAM: PORTABLE CHEST 1 VIEW COMPARISON:  July 21, 2021. FINDINGS: LEFT-sided dual lead pacer device remains in place. Power pack over the LEFT chest. Leads project over the cardiac silhouette. Image rotated slightly to the RIGHT. Accounting for this cardiomediastinal contour shows stable cardiac enlargement and central pulmonary vascular engorgement. No signs of lobar consolidation. No sign of pleural effusion on frontal radiograph. No visible pneumothorax. Linear opacities at the lung bases. On limited assessment there is no acute skeletal finding. IMPRESSION: Cardiomegaly with central pulmonary vascular engorgement. No signs of lobar consolidation or effusion. Favor scarring or atelectasis at the lung bases. Electronically Signed   By: Zetta Bills M.D.   On: 07/23/2021 16:35   ? ? ?Assessment/Plan: ?Staph aureus pre patellar bursitis s/p Ix D= continue on daptomycin. Will check sed rate and crp. IR may place central line tomorrow if blood culture remains negative today ? ?T2dm= recommend BS control with BS <200 for wound improvement ? ?[name] ?[facility] for Infectious Diseases ?Pager: [phone] ? ?07/24/2021, 4:23 PM ? ? ? ?  ?

## 2021-07-24 NOTE — Progress Notes (Signed)
Pt calls out to staff multiple times throughout shift stating she can not breathe. RN walks into patients room multiple times to find pt has self removed her oxygen.  ?RN continues to reeducate patient on importance of keeping her oxygen on to prevent further complications. Will continue to monitor.  ?

## 2021-07-24 NOTE — Progress Notes (Addendum)
[facility] ?Progress Note ? ?Subjective: seen in room, creat down 2.0 today. No new c/o ? ?Vitals:  ? 07/23/21 2141 07/23/21 2324 07/24/21 0345 07/24/21 0400  ?BP:  (!) 127/45 (!) 143/51 (!) 138/49  ?Pulse:  (!) 59 60   ?Resp:  13 11   ?Temp:  97.6 ?F (36.4 ?C) (!) 96.5 ?F (35.8 ?C)   ?TempSrc:  Axillary Axillary   ?SpO2: 97% 95% 98%   ?Weight:      ?Height:      ? ? ?Exam: ?Gen alert, no distress ?No jvd or bruits ?Chest no rales, +wheezing, no rhonchi ?RRR no MRG ?Abd soft ntnd no mass or ascites +bs ?Ext no LE or UE edema ?Neuro is alert, Ox 3 , nf ?    ?  ?  ?  ?  ? Home meds include - amiodarone, zyloprim, norvasc, eliquis, lipitor, breztri aerosphere 2 bid, neurontin, norco prn, novolog/ lantus insulins, synthroid, losartan 50, toprol xl 100 qd, torsemide 80 qd, prns/ vits/ supps ?  ?  UA 3/21 - 0-5 rbc/ wbc, 6-10 epi, >300 protein, cloudy, glu 50 ?  ?  ?  Date                           Creat               eGFR ?  2009- 2010                1.0- 1.4 ?  2013- 2018                1.08- 1.77 ?  2019- 2020                1.18- 1.38         ?  2021                          1.05- 1.82 ?  Jan-June 2022          1.78- 2.15 ?  July -dec 2022          1.48- 2.03 ?  Jan 2023                   1.49- 1.53        34- 35 ml/min, stage IIIb ?  3/20                           1.66 ?  3/21                           1.77 ?  07/23/21                      2.25                  ?  ? ?    CXR today - no edema, no sig congestion ?    Renal US - 11- 13 cm kidneys w/o hydro ?      UNa 28, UCr 68 ?  ?Assessment/ Plan: ?AKI on CKD 3b - b/l creatinine 1.49- 1.53 from Jan 2023, eGFR 34- 35 ml/min. Here creat 1.6 on admit 3/20 in setting of acute septic bursitis of the R knee. Went to OR 3/21. Creat bumped up to 2.2 yesterday 3/22. UA unremarkable except for  high protein on dipstick. UP/C ratio ordered. [place] neg for obstruction.  Suspect AKI due to combination of IV vanc, ARB and diuretics (all dc'd) causing nonoliguric ATN.  Urine lytes c/w ATN. UOP very good. Creat down today to 2.0, suspect early recovery. Will dc IVF's, otherwise cont supportive measures.  ?Septic bursitis - of the knee, sp I&D, getting daptomycin IV.  ?PAF - on BB, amio, eliquis ?HTN/ volume - BP's normal to high, holding ARB appropriately. No vol excess on exam, repeat CXR showed no edema.  ?DM2 - per pmd ?Sp PPM ?Chronic diast CHF - no signs of vol overload ?COPD  ?  ?  ?  ? ?Rob Havilah Topor ?07/24/2021, 8:07 AM ? ? ?Recent Labs  ?Lab 07/23/21 ?0331 07/23/21 ?1713 07/24/21 ?8101 07/24/21 ?7510  ?HGB 9.2*  --  11.0*  --   ?CALCIUM 7.9*   < > 8.7* 8.8*  ?CREATININE 2.25*   < > 2.00* 2.05*  ?K 4.4   < > 4.8 4.6  ? < > = values in this interval not displayed.  ? ?Inpatient medications: ? albuterol  2.5 mg Nebulization TID  ? allopurinol  100 mg Oral BID  ? amiodarone  200 mg Oral QHS  ? amLODipine  5 mg Oral Daily  ? apixaban  2.5 mg Oral BID  ? Budeson-Glycopyrrol-Formoterol  2 puff Inhalation BID  ? chlorhexidine  15 mL Mouth Rinse BID  ? Chlorhexidine Gluconate Cloth  6 each Topical Daily  ? docusate sodium  100 mg Oral BID  ? ferrous sulfate  325 mg Oral TID PC  ? fluticasone  2 spray Each Nare Daily  ? gabapentin  300 mg Oral TID  ? insulin aspart  0-20 Units Subcutaneous TID WC  ? insulin aspart  0-5 Units Subcutaneous QHS  ? insulin aspart  6 Units Subcutaneous TID WC  ? insulin glargine-yfgn  50 Units Subcutaneous BID  ? levothyroxine  200 mcg Oral QAC breakfast  ? mouth rinse  15 mL Mouth Rinse q12n4p  ? metoprolol succinate  100 mg Oral Daily  ? multivitamin with minerals  1 tablet Oral Daily  ? mupirocin ointment  1 application. Nasal BID  ? ? sodium chloride Stopped (07/23/21 1945)  ? sodium chloride Stopped (07/24/21 0205)  ? cefTRIAXone (ROCEPHIN)  IV Stopped (07/23/21 2218)  ? DAPTOmycin (CUBICIN)  IV Stopped (07/23/21 1250)  ? dextrose 5% lactated ringers 75 mL/hr at 07/24/21 0703  ? insulin 3.6 Units/hr (07/24/21 0703)  ? lactated ringers 125 mL/hr at  07/23/21 2311  ? methocarbamol (ROBAXIN) IV    ? ?sodium chloride, acetaminophen, bisacodyl, dextrose, diphenhydrAMINE, HYDROcodone-acetaminophen, HYDROcodone-acetaminophen, ipratropium-albuterol, menthol-cetylpyridinium **OR** phenol, methocarbamol **OR** methocarbamol (ROBAXIN) IV, metoCLOPramide **OR** metoCLOPramide (REGLAN) injection, morphine injection, ondansetron **OR** ondansetron (ZOFRAN) IV, polyethylene glycol ? ? ? ? ? ? ?

## 2021-07-24 NOTE — Progress Notes (Signed)
? ?Subjective: ?2 Days Post-Op Procedure(s) (LRB): ?INCISION AND DRAINAGE ABSCESS RIGHT KNEE (Right) ?Patient reports pain as mild.   ?Patient seen in rounds with Dr. Alvan Dame. ?Patient was transferred to ICU overnight due to elevated blood glucose requiring insulin drip. She states her knee is hurting some.  ? ? ?Objective: ?Vital signs in last 24 hours: ?Temp:  [96.5 ?F (35.8 ?C)-97.7 ?F (36.5 ?C)] 96.5 ?F (35.8 ?C) (03/23 0345) ?Pulse Rate:  [59-66] 60 (03/23 0345) ?Resp:  [11-21] 11 (03/23 0345) ?BP: (127-202)/(45-78) 138/49 (03/23 0400) ?SpO2:  [94 %-100 %] 98 % (03/23 0345) ? ?Intake/Output from previous day: ? ?Intake/Output Summary (Last 24 hours) at 07/24/2021 0724 ?Last data filed at 07/24/2021 0703 ?Gross per 24 hour  ?Intake 2970.37 ml  ?Output 1099 ml  ?Net 1871.37 ml  ?  ? ?Intake/Output this shift: ?Total I/O ?In: 740.8 [I.V.:740.8] ?Out: -  ? ?Labs: ?Recent Labs  ?  07/21/21 ?1421 07/22/21 ?3419 07/23/21 ?3790 07/24/21 ?2409  ?HGB 10.8* 9.4* 9.2* 11.0*  ? ?Recent Labs  ?  07/23/21 ?0331 07/24/21 ?0312  ?WBC 14.8* 15.0*  ?RBC 3.17* 3.80*  ?HCT 29.8* 34.7*  ?PLT 422* 444*  ? ?Recent Labs  ?  07/24/21 ?0312 07/24/21 ?0616  ?NA 134* 134*  ?K 4.8 4.6  ?CL 103 102  ?CO2 22 20*  ?BUN 60* 62*  ?CREATININE 2.00* 2.05*  ?GLUCOSE 164* 175*  ?CALCIUM 8.7* 8.8*  ? ?Recent Labs  ?  07/21/21 ?1559  ?INR 1.6*  ? ? ?Exam: ?General - Patient is Alert and Oriented ?Extremity - Neurologically intact ?Sensation intact distally ?Intact pulses distally ?Dorsiflexion/Plantar flexion intact ?Dressing - dressing C/D/I. Hemovac drain pulled ?Motor Function - intact, moving foot and toes well on exam.  ? ?Past Medical History:  ?Diagnosis Date  ? Allergy   ? Anxiety   ? Arthritis   ? CAD (coronary artery disease)   ? Mild per remote cath in 2006, /    Nuclear, January, 2013, no significant abnormality,  ? Carotid artery disease ([place])   ? Doppler, January, 2013, 0 73% RIC A., 53-29% LICA, stable  ? Cataract   ? CHF (congestive heart  failure) ([place])   ? Chronic kidney disease   ? Complication of anesthesia   ? wakes up "mean"  ? COPD 02/16/2007  ? Intolerant of Spiriva, tudorza Intolerant of Trelegy   ? COPD (chronic obstructive pulmonary disease) ([place])   ? Coronary atherosclerosis 11/24/2008  ? Catheterization 2006, nonobstructive coronary disease   //   nuclear 2013, low risk    ? Diabetes mellitus, type 2 ([place])   ? Diverticula of colon   ? DIVERTICULOSIS OF COLON 03/23/2007  ? Qualifier: Diagnosis of  By: Halford Chessman MD, [place]    ? Dizziness   ? occasional  ? Dizzy spells 05/04/2011  ? DM (diabetes mellitus) ([place]) 03/23/2007  ? Qualifier: Diagnosis of  By: Halford Chessman MD, Vine[place]    ? Dyspnea   ? On exertion  ? Ejection fraction   ? Essential hypertension 02/16/2007  ? Qualifier: Diagnosis of  By: Rosana Hoes CMA, Tammy    ? G E R D 03/23/2007  ? Qualifier: Diagnosis of  By: Halford Chessman MD, [place]    ? Gall bladder disease 04/28/2016  ? Gastritis and gastroduodenitis   ? GERD (gastroesophageal reflux disease)   ? Goiter   ? hx of  ? Headache(784.0)   ? migraine  ? Hyperlipidemia   ? Hypertension   ? Hypothyroidism   ? HYPOTHYROIDISM, POSTSURGICAL 06/04/2008  ?  Qualifier: Diagnosis of  By: Loanne Drilling MD, Jacelyn Pi   ? Left ventricular hypertrophy   ? Leg edema   ? occasional swelling  ? Leg swelling 12/09/2018  ? Myofascial pain syndrome, cervical 01/06/2018  ? Obesity   ? OBESITY 11/24/2008  ? Qualifier: Diagnosis of  By: Sidney Ace    ? OBSTRUCTIVE SLEEP APNEA 02/16/2007  ? Auto CPAP 09/03/13 to 10/02/13 >> used on 30 of 30 nights with average 6 hrs and 3 min.  Average AHI is 3.1 with median CPAP 5 cm H2O and 95 th percentile CPAP 6 cm H2O.    ? Occipital neuralgia of right side 01/06/2018  ? OSA (obstructive sleep apnea)   ? cpap setting of 12  ? Pain in joint of right hip 01/21/2018  ? Palpitations 01/21/2011  ? 48 hour Holter, 2015, scattered PACs and PVCs.   ? Paroxysmal atrial fibrillation (HCC)   ? Pneumonia   ? PONV (postoperative nausea and vomiting)   ? severe  after every surgery  ? Pulmonary hypertension, secondary   ? RHINITIS 07/21/2010  ?     ? RUQ abdominal pain   ? S/P left TKA 07/27/2011  ? Sinus node dysfunction ([place]) 11/08/2015  ? Sleep apnea   ? Spinal stenosis of cervical region 11/07/2014  ? Urinary frequency 12/09/2018  ? UTI (urinary tract infection) 08/15/2019  ? per pt report  ? VENTRICULAR HYPERTROPHY, LEFT 11/24/2008  ? Qualifier: Diagnosis of  By: Sidney Ace    ? ? ?Assessment/Plan: ?2 Days Post-Op Procedure(s) (LRB): ?INCISION AND DRAINAGE ABSCESS RIGHT KNEE (Right) ?Principal Problem: ?  Septic prepatellar bursitis of right knee ?Active Problems: ?  HYPOTHYROIDISM, POSTSURGICAL ?  Insulin dependent type 2 diabetes mellitus ([place]) ?  Hyperlipidemia associated with type 2 diabetes mellitus ([place]) ?  OBESITY ?  OBSTRUCTIVE SLEEP APNEA ?  Hypertension associated with diabetes ([place]) ?  Coronary atherosclerosis ?  COPD (chronic obstructive pulmonary disease) ([place]) ?  Chronic diastolic heart failure ([place]) ?  Paroxysmal atrial fibrillation (HCC) ?  Pacemaker - MDT ?  Chronic kidney disease, stage 3b ([place]) ? ?Estimated body mass index is 38.27 kg/m? as calculated from the following: ?  Height as of this encounter: '5\' 5"'$  (1.651 m). ?  Weight as of this encounter: 104.3 kg. ?Advance diet ?Up with therapy ?D/C IV fluids ? ?Assessment: ?Septic prepatellar bursitis of the right knee, s/p I&D and primary closure  ? ?Plan:  ?Transferred to ICU due to elevated BG ? ?Hgb stable at 11.0 this AM. ?Cr. 2.05 this AM - seen by Nephrology yesterday ? ?Hemovac drain pulled today. Will change dressing tomorrow. ? ?Continue to get up as able while in the hospital ? ?Griffith Citron, PA-C ?Orthopedic Surgery ?[phone] ?07/24/2021, 7:24 AM  ?

## 2021-07-24 NOTE — Progress Notes (Signed)
?Progress Note ? ? ?Patient: Kayla [name] LDJ:[id number] DOB: [birth year] DOA: 07/21/2021     3 ?DOS: the patient was seen and examined on 07/24/2021 ?  ?Brief hospital course: ?Kayla [name] is an 81 y.o. female with a history of CHF, CKD stage 3b, COPD, chronic pain, CAD, diabetes mellitus on insulin, hypertension, paroxysmal atrial fibrillation. Patient presented secondary to worsening knee pain and erythema and found to have evidence of recurrent septic prepatellar bursitis. Empiric antibiotics initiated and orthopedic surgery consulted.  Patient underwent excisional l and non excisional debridement of right knee prepatellar space including a combined 5-6 inch incision, Hemovac drain was placed by Dr. Alvan Dame on 3/21. Hemovac removed.  ? ?ID consulted, recommend 6 weeks of IV antibiotics. Culture growing staph, awaiting sensitivity.  ? ?Kayla [name] develops worsening hyperglycemia 3/22. Transfer to Step down unit for insulin gtt. Transition to long acting insulin 3/23. ? ?Assessment and Plan: ?* Septic prepatellar bursitis of right knee ?Recurrent septic prepatellar Bursitis.  Associated leukocytosis with WBC of 27,800. Orthopedic surgery consulted, underwent  I&D 3/21.  ?-Hemovac removed 3/23. ?-Follow wound culture: Staph Aureus.  ?-Follow-up blood cultures: No growth to date.  ?-ID consulted, Vancomycin change to Daptomycin die to AKI. Continue with ceftriaxone.  ?-patient will need 6 weeks IV antibiotics.  ? ? ?Paroxysmal atrial fibrillation (HCC) ?Patient is on Eliquis, amiodarone and Toprol XL as an outpatient. ?-Continue amiodarone and Toprol XL ?continue with eliquis.  ? ?Hypertension associated with diabetes ([place]) ?Patient is on losartan, Toprol XL and amlodipine as an outpatient. ?-Continue Toprol XL and amlodipine. ?-Hold Cozaar due to AKI ? ?Insulin dependent type 2 diabetes mellitus ([place]) ?Patient is on Lantus 110 units am and 90 at hs. > and Novolog sliding scale as an outpatient. ?- Hypoglycemia while in  the ED with blood sugar of 53. ?-Develops Hyperglycemia, in setting of steroids, and Lantus was on hold.  ?-Kayla [name] was transfer to Step down 3/22 , started on insulin gtt. Plan to transition to semglee and SSI.  ? ?Chronic kidney disease, stage 3b ([place]) ?Baseline creatinine of about 1.6--1.8 ?AKI on CKD stage 3b ?Patient creatinine increased to 1.2. ?Continue to  hold Cozaar on torsemide. ?Continue IV fluids. ?Renal US; simple renal cyst. No others abnormalities.  ?Cr down to 2.0 ?Nephrology consulted. Considering ATN.  ? ?Pacemaker - MDT ?Noted. ? ?Chronic diastolic heart failure ([place]) ?Euvolemic. Patient is on torsemide as an outpatient. ?-Developed AKI, plan to hold torsemide.  Monitor volume status ? ?COPD (chronic obstructive pulmonary disease) ([place]) ?Patient is on albuterol and Breztri as an outpatient. ?-Continue with albuterol TID  ?-Continue Breztri ?-Incentive spirometry. ?-Report chronic shortness of breath. ? ?OBSTRUCTIVE SLEEP APNEA ?-Continue CPAP ? ?OBESITY ?Body mass index is 38.27 kg/m?[place]  ?Needs lifestyle modification ? ?Hyperlipidemia associated with type 2 diabetes mellitus ([place]) ?-Continue Lipitor ? ?HYPOTHYROIDISM, POSTSURGICAL ?-Continue Synthroid 200 mcg ? ? ? ? ?  ? ?Subjective: Kayla [name] is alert, denies worsening kee pain. Kayla [name] was confuse last night per report. Kayla [name] is alert and oriented this am.  ? ? ?Physical Exam: ?Vitals:  ? 07/24/21 0813 07/24/21 0826 07/24/21 0830 07/24/21 0848  ?BP:      ?Pulse:   60 69  ?Resp:   15 12  ?Temp:  97.7 ?F (36.5 ?C)    ?TempSrc:  Oral    ?SpO2:   98% 98%  ?Weight: 110.5 kg     ?Height:      ? ?General; Kayla [name] is alert, conversant.  ?CVS; S 1, S 2  RRR ?Lungs; CTA ?Abdomen; soft, nt, nd ?Extremity: Right knee with dressing.  ? ?Data Reviewed: ? ?Cbg, bmet results.  ? ?Family Communication: Husband at bedside.  ? ?Disposition: ?Status is: Inpatient ?Remains inpatient appropriate because: requiring IV fluids, antibiotics,. DM controlled.  ? Planned Discharge Destination:  Home vs SNF ? ? ? ?Time spent: 45 minutes ? ?Author: ?Elmarie Shiley, MD ?07/24/2021 10:33 AM ? ?For on call review [email or web address].  ?

## 2021-07-24 NOTE — Progress Notes (Signed)
Staff responded to bed alarm in this patients room. Kayla [name] was attempting to remove equipment and get out of bed unassisted.  ?Disoriented to time and place. Patient was reoriented, provided reassurance and bed alarm set. ?

## 2021-07-24 NOTE — Progress Notes (Signed)
RN notified by NT that patient was requesting me. Upon entering pts room, pt is sitting in her chair. Pt states "I fell right here about 30 minutes ago onto my bottom but I am fine no need to report it." Pt states she helped herself off the ground by crawling back into her chair. RN was sitting near pts room and did not hear any commotion or chair alarm which was activated & in use. NT denies hearing chair alarm. Pt denies hitting her head, denies any pain other than her knee pain, & does not appear to have any new injuries. Pt aox4 yet very forgetful & confused at times.  ?RN reeducated pt on importance of using her call light & waiting for staff assistance when needing to get up in order to keep her safe. RN assisted pt to bed, bed alarm in use, call bell within reach, bed in lowest position. ? ?On Call MD notified, no new orders at this time, will continue to monitor.  ?

## 2021-07-24 NOTE — Progress Notes (Signed)
Physical Therapy Treatment ?Patient Details ?Name: Kayla [name] ?MRN: [id number] ?DOB: Sep 24, 1940 ?Today's Date: 07/24/2021 ? ? ?History of Present Illness 81 yo female admitted with R knee septic bursitis. s/p I&D 07/22/21. Recent admission for same, S/P I&D R knee 05/30/21. Hx of obesity, DM, OSA, COPD, HF, CKD, cellultis, L great toe amputation, L TKA, chronic pain, CAD ? ?  ?PT Comments  ? ? Pt reporting severe pain in RLE, states "I can't walk" upon PT arrival to room. Pt tolerated step pivot transfer only today secondary to pain, and very limited RLE exercises to address ROM and strength deficits. Pt feels she will be okay to d/c home with continued pain control and support of husband, will continue to follow and progress mobility as tolerated.  ? ?  ?Recommendations for follow up therapy are one component of a multi-disciplinary discharge planning process, led by the attending physician.  Recommendations may be updated based on patient status, additional functional criteria and insurance authorization. ? ?Follow Up Recommendations ? Follow physician's recommendations for discharge plan and follow up therapies (Pt preference for HHPT) ?  ?  ?Assistance Recommended at Discharge Intermittent Supervision/Assistance  ?Patient can return home with the following Help with stairs or ramp for entrance;Assist for transportation;A little help with bathing/dressing/bathroom;Assistance with cooking/housework ?  ?Equipment Recommendations ? None recommended by PT  ?  ?Recommendations for Other Services   ? ? ?  ?Precautions / Restrictions Precautions ?Precautions: Fall ?Precaution Comments: 1 fall 5-6 months ago ?Restrictions ?Weight Bearing Restrictions: No ?RLE Weight Bearing: Weight bearing as tolerated  ?  ? ?Mobility ? Bed Mobility ?Overal bed mobility: Needs Assistance ?Bed Mobility: Supine to Sit ?  ?  ?Supine to sit: Min assist ?  ?  ?General bed mobility comments: assist for RLE progression to EOB, trunk rise via  HHA, cues for sequencing task. ?  ? ?Transfers ?Overall transfer level: Needs assistance ?Equipment used: Rolling walker (2 wheels) ?Transfers: Sit to/from Stand, Bed to chair/wheelchair/BSC ?Sit to Stand: Min assist ?  ?Step pivot transfers: Min assist ?  ?  ?  ?General transfer comment: assist for power up, rise, steadying, and pivotal steps towards R to reach recliner. Pt with very painful RLE, limited WB. ?  ? ?Ambulation/Gait ?  ?  ?  ?  ?  ?  ?  ?  ? ? ?Stairs ?  ?  ?  ?  ?  ? ? ?Wheelchair Mobility ?  ? ?Modified Rankin (Stroke Patients Only) ?  ? ? ?  ?Balance Overall balance assessment: Mild deficits observed, not formally tested ?  ?  ?  ?  ?  ?  ?  ?  ?  ?  ?  ?  ?  ?  ?  ?  ?  ?  ?  ? ?  ?Cognition Arousal/Alertness: Awake/alert ?Behavior During Therapy: Anxious ?Overall Cognitive Status: Within Functional Limits for tasks assessed ?  ?  ?  ?  ?  ?  ?  ?  ?  ?  ?  ?  ?  ?  ?  ?  ?General Comments: anxiety with mobility, more limited command following when anxious ?  ?  ? ?  ?Exercises General Exercises - Lower Extremity ?Ankle Circles/Pumps: AROM, Both, 10 reps, Seated ?Quad Sets: AROM, Both, Seated, 5 reps ?Short Arc Quad: AAROM, Right, 5 reps, Seated ? ?  ?General Comments General comments (skin integrity, edema, etc.): vss, 3LO2 via [place] ?  ?  ? ?Pertinent Vitals/Pain  Pain Assessment ?Pain Assessment: Faces ?Faces Pain Scale: Hurts whole lot ?Pain Location: R knee ?Pain Descriptors / Indicators: Sore, Discomfort, Grimacing, Guarding ?Pain Intervention(s): Limited activity within patient's tolerance, Monitored during session, Repositioned  ? ? ?Home Living   ?  ?  ?  ?  ?  ?  ?  ?  ?  ?   ?  ?Prior Function    ?  ?  ?   ? ?PT Goals (current goals can now be found in the care plan section) Acute Rehab PT Goals ?Patient Stated Goal: home ?PT Goal Formulation: With patient/family ?Time For Goal Achievement: 08/06/21 ?Potential to Achieve Goals: Good ?Progress towards PT goals: Progressing toward goals ? ?   ?Frequency ? ? ? Min 3X/week ? ? ? ?  ?PT Plan Current plan remains appropriate  ? ? ?Co-evaluation   ?  ?  ?  ?  ? ?  ?AM-PAC PT "6 Clicks" Mobility   ?Outcome Measure ? Help needed turning from your back to your side while in a flat bed without using bedrails?: A Little ?Help needed moving from lying on your back to sitting on the side of a flat bed without using bedrails?: A Little ?Help needed moving to and from a bed to a chair (including a wheelchair)?: A Little ?Help needed standing up from a chair using your arms (e.g., wheelchair or bedside chair)?: A Little ?Help needed to walk in hospital room?: A Little ?Help needed climbing 3-5 steps with a railing? : A Lot ?6 Click Score: 17 ? ?  ?End of Session Equipment Utilized During Treatment: Gait belt;Oxygen ?Activity Tolerance: Patient limited by pain;Patient limited by fatigue ?Patient left: in chair;with call bell/phone within reach;with chair alarm set;with family/visitor present ?Nurse Communication: Mobility status ?PT Visit Diagnosis: Pain;Difficulty in walking, not elsewhere classified (R26.2);Muscle weakness (generalized) (M62.81) ?Pain - Right/Left: Right ?Pain - part of body: Knee ?  ? ? ?Time: 7026-3785 ?PT Time Calculation (min) (ACUTE ONLY): 25 min ? ?Charges:  $Therapeutic Activity: 8-22 mins          ?          ?Kayla [name], PT DPT ?Acute Rehabilitation Services ?Pager [phone]  ?Office [phone] ? ? ? ?Kayla [name] ?07/24/2021, 11:49 AM ? ?

## 2021-07-24 NOTE — Progress Notes (Signed)
Inpatient Diabetes Program Recommendations ? ?AACE/ADA: New Consensus Statement on Inpatient Glycemic Control (2015) ? ?Target Ranges:  Prepandial:   less than 140 mg/dL ?     Peak postprandial:   less than 180 mg/dL (1-2 hours) ?     Critically ill patients:  140 - 180 mg/dL  ? ?Lab Results  ?Component Value Date  ? GLUCAP 198 (H) 07/24/2021  ? HGBA1C 8.4 (H) 05/30/2021  ? ? ?Review of Glycemic Control ? ?Diabetes history: DM2 ?Outpatient Diabetes medications: Lantus 90 units in am and 100 units QHS, Novolog s/s ?Current orders for Inpatient glycemic control: Semglee 50 units BID, Novolog 0-20 units TID with meals and 0-5 HS + 6 units TID ? ?HgbA1C - 8.4% ? ?Inpatient Diabetes Program Recommendations:   ? ?If post-prandials elevated > 180 mg/dL,  increase Novolog to 8 units TID ? ?May need to titrate Lantus if FBS > 180 mg/dL (pt takes 190 units Lantus each day) ? ?Spoke with pt at bedside about her diabetes control and home insulin. Pt states she sometimes wakes up during the night with hypoglycemia. Explained that Lantus HS dose may need to be decreased. On Novolog s/s during the day. Needs close f/u with PCP to manage her diabetes. Answered questions.  ? ?Thank you. ?Lorenda Peck, RD, LDN, CDE ?Inpatient Diabetes Coordinator ?[phone]  ? ? ? ? ? ? ? ? ? ? ? ? ? ? ? ? ? ?

## 2021-07-25 ENCOUNTER — Inpatient Hospital Stay (HOSPITAL_COMMUNITY): Payer: Medicare HMO

## 2021-07-25 HISTORY — PX: IR US GUIDE VASC ACCESS RIGHT: IMG2390

## 2021-07-25 HISTORY — PX: IR FLUORO GUIDE CV LINE RIGHT: IMG2283

## 2021-07-25 LAB — CBC
HCT: 34.2 % — ABNORMAL LOW (ref 36.0–46.0)
Hemoglobin: 10.6 g/dL — ABNORMAL LOW (ref 12.0–15.0)
MCH: 29.1 pg (ref 26.0–34.0)
MCHC: 31 g/dL (ref 30.0–36.0)
MCV: 94 fL (ref 80.0–100.0)
Platelets: 565 10*3/uL — ABNORMAL HIGH (ref 150–400)
RBC: 3.64 MIL/uL — ABNORMAL LOW (ref 3.87–5.11)
RDW: 15.9 % — ABNORMAL HIGH (ref 11.5–15.5)
WBC: 19.8 10*3/uL — ABNORMAL HIGH (ref 4.0–10.5)
nRBC: 0 % (ref 0.0–0.2)

## 2021-07-25 LAB — BASIC METABOLIC PANEL
Anion gap: 9 (ref 5–15)
BUN: 67 mg/dL — ABNORMAL HIGH (ref 8–23)
CO2: 24 mmol/L (ref 22–32)
Calcium: 9.1 mg/dL (ref 8.9–10.3)
Chloride: 103 mmol/L (ref 98–111)
Creatinine, Ser: 2.05 mg/dL — ABNORMAL HIGH (ref 0.44–1.00)
GFR, Estimated: 24 mL/min — ABNORMAL LOW (ref >60)
Glucose, Bld: 240 mg/dL — ABNORMAL HIGH (ref 70–99)
Potassium: 4.9 mmol/L (ref 3.5–5.1)
Sodium: 136 mmol/L (ref 135–145)

## 2021-07-25 LAB — GLUCOSE, CAPILLARY
Glucose-Capillary: 270 mg/dL — ABNORMAL HIGH (ref 70–99)
Glucose-Capillary: 235 mg/dL — ABNORMAL HIGH (ref 70–99)
Glucose-Capillary: 161 mg/dL — ABNORMAL HIGH (ref 70–99)
Glucose-Capillary: 124 mg/dL — ABNORMAL HIGH (ref 70–99)

## 2021-07-25 MED ORDER — CHLORHEXIDINE GLUCONATE CLOTH 2 % EX PADS
6.0000 | MEDICATED_PAD | Freq: Every day | CUTANEOUS | Status: DC
  Administered 2021-07-25 – 2021-07-27 (×3): 6 via TOPICAL

## 2021-07-25 MED ORDER — MOMETASONE FURO-FORMOTEROL FUM 100-5 MCG/ACT IN AERO
2.0000 | INHALATION_SPRAY | Freq: Two times a day (BID) | RESPIRATORY_TRACT | Status: DC
  Administered 2021-07-25 – 2021-07-27 (×4): 2 via RESPIRATORY_TRACT
  Filled 2021-07-25: qty 8.8

## 2021-07-25 MED ORDER — UMECLIDINIUM BROMIDE 62.5 MCG/ACT IN AEPB
1.0000 | INHALATION_SPRAY | Freq: Every day | RESPIRATORY_TRACT | Status: DC
  Administered 2021-07-26 – 2021-07-27 (×2): 1 via RESPIRATORY_TRACT
  Filled 2021-07-25: qty 7

## 2021-07-25 MED ORDER — INSULIN GLARGINE-YFGN 100 UNIT/ML ~~LOC~~ SOLN
55.0000 [IU] | Freq: Two times a day (BID) | SUBCUTANEOUS | Status: DC
Start: 2021-07-25 — End: 2021-07-27
  Administered 2021-07-25 – 2021-07-27 (×5): 55 [IU] via SUBCUTANEOUS
  Filled 2021-07-25 (×7): qty 0.55

## 2021-07-25 MED ORDER — SODIUM CHLORIDE 0.9% FLUSH
10.0000 mL | INTRAVENOUS | Status: DC | PRN
  Administered 2021-07-27: 20 mL

## 2021-07-25 MED ORDER — INSULIN ASPART 100 UNIT/ML IJ SOLN
10.0000 [IU] | Freq: Three times a day (TID) | INTRAMUSCULAR | Status: DC
  Administered 2021-07-25 – 2021-07-27 (×6): 10 [IU] via SUBCUTANEOUS

## 2021-07-25 MED ORDER — LIDOCAINE HCL 1 % IJ SOLN
INTRAMUSCULAR | Status: AC
  Filled 2021-07-25: qty 20

## 2021-07-25 MED ORDER — LIDOCAINE-EPINEPHRINE 1 %-1:100000 IJ SOLN
INTRAMUSCULAR | Status: AC
  Filled 2021-07-25: qty 1

## 2021-07-25 NOTE — Progress Notes (Signed)
?   07/24/21 2249  ?Assess: MEWS Score  ?Pulse Rate 74  ?Resp (!) 26  ?SpO2 98 %  ?Assess: MEWS Score  ?MEWS Temp 0  ?MEWS Systolic 0  ?MEWS Pulse 0  ?MEWS RR 2  ?MEWS LOC 0  ?MEWS Score 2  ?MEWS Score Color Yellow  ?Assess: if the MEWS score is Yellow or Red  ?Were vital signs taken at a resting state? No  ?Focused Assessment No change from prior assessment  ?Does the patient meet 2 or more of the SIRS criteria? No  ?MEWS guidelines implemented *See Row Information* No, vital signs rechecked  ?Treat  ?MEWS Interventions Administered scheduled meds/treatments;Consulted Respiratory Therapy  ?Patients response to intervention Effective  ?Escalate  ?MEWS: Escalate Yellow: discuss with charge nurse/RN and consider discussing with provider and RRT  ?Notify: Charge Nurse/RN  ?Name of Charge Nurse/RN Notified Anderson Malta RN  ?Date Charge Nurse/RN Notified 07/25/21  ?Time Charge Nurse/RN Notified 0000  ?Document  ?Patient Outcome Stabilized after interventions  ?Progress note created (see row info) Yes  ?Assess: SIRS CRITERIA  ?SIRS Temperature  0  ?SIRS Pulse 0  ?SIRS Respirations  1  ?SIRS WBC 0  ?SIRS Score Sum  1  ? ?Pt had a coughing spell, resulting in her becoming very anxious & SOB. She became tachypneic, o2 sat 97% on 4L. RT on unit, administered PRN treatment & placed pt on bipap for the night. RN administered cough medication, and reassured pt. Vitals rechecked at a resting, calm state. 02 sat 100% on 3L, HR 70, RR 20. Protocol not initiated. Notified Mining engineer. Will continue to monitor.  ?

## 2021-07-25 NOTE — Progress Notes (Signed)
?  [facility] for Infectious Disease   ? ?Date of Admission:  07/21/2021    ? ? ?ID: Kayla [name] is a 81 y.o. female with   ?Principal Problem: ?  Septic prepatellar bursitis of right knee ?Active Problems: ?  HYPOTHYROIDISM, POSTSURGICAL ?  Insulin dependent type 2 diabetes mellitus ([place]) ?  Hyperlipidemia associated with type 2 diabetes mellitus ([place]) ?  OBESITY ?  OBSTRUCTIVE SLEEP APNEA ?  Hypertension associated with diabetes ([place]) ?  Coronary atherosclerosis ?  COPD (chronic obstructive pulmonary disease) ([place]) ?  Chronic diastolic heart failure ([place]) ?  Paroxysmal atrial fibrillation ([facility]) ?  Pacemaker - MDT ?  Chronic kidney disease, stage 3b ([place]) ? ? ? ?Subjective: ? ?Afebrile, looking forward to going home.  ?Medications:  ? albuterol  2.5 mg Nebulization TID  ? allopurinol  100 mg Oral BID  ? amiodarone  200 mg Oral QHS  ? amLODipine  10 mg Oral Daily  ? apixaban  2.5 mg Oral BID  ? chlorhexidine  15 mL Mouth Rinse BID  ? docusate sodium  100 mg Oral BID  ? ferrous sulfate  325 mg Oral Q breakfast  ? fluticasone  2 spray Each Nare Daily  ? gabapentin  300 mg Oral TID  ? insulin aspart  0-20 Units Subcutaneous TID WC  ? insulin aspart  0-5 Units Subcutaneous QHS  ? insulin aspart  10 Units Subcutaneous TID WC  ? insulin glargine-yfgn  55 Units Subcutaneous BID  ? levothyroxine  200 mcg Oral QAC breakfast  ? mouth rinse  15 mL Mouth Rinse q12n4p  ? metoprolol succinate  100 mg Oral Daily  ? mometasone-formoterol  2 puff Inhalation BID  ? And  ? umeclidinium bromide  1 puff Inhalation Daily  ? multivitamin with minerals  1 tablet Oral Daily  ? mupirocin ointment  1 application. Nasal BID  ? ? ?Objective: ?Vital signs in last 24 hours: ?Temp:  [97.4 ?F (36.3 ?C)-97.8 ?F (36.6 ?C)] 97.8 ?F (36.6 ?C) (03/24 0409) ?Pulse Rate:  [59-82] 60 (03/24 0515) ?Resp:  [15-26] 20 (03/24 0409) ?BP: (147-173)/(56-98) 158/66 (03/24 0515) ?SpO2:  [95 %-100 %] 96 % (03/24 0734) ?Weight:  [110.9 kg] 110.9 kg (03/24  0500) ?Physical Exam  ?Constitutional:  oriented to person, place, and time. appears well-developed and well-nourished. No distress.  ?HENT: [place]/AT, PERRLA, no scleral icterus ?Mouth/Throat: Oropharynx is clear and moist. No oropharyngeal exudate.  ?Cardiovascular: Normal rate, regular rhythm and normal heart sounds. Exam reveals no gallop and no friction rub.  ?No murmur heard.  ?Pulmonary/Chest: Effort normal and breath sounds normal. No respiratory distress.  has no wheezes.  ?Neck = supple, no nuchal rigidity ?Abdominal: Soft. Bowel sounds are normal.  exhibits no distension. There is no tenderness.  ?Ext: leg wrapped from surgery ?Lymphadenopathy: no cervical adenopathy. No axillary adenopathy ?Neurological: alert and oriented to person, place, and time.  ?Skin: Skin is warm and dry. No rash noted. No erythema.  ?Psychiatric: a normal mood and affect.  behavior is normal.  ? ? ? ? ?Lab Results ?Recent Labs  ?  07/24/21 ?0312 07/24/21 ?0616 07/24/21 ?1001 07/25/21 ?0453  ?WBC 15.0*  --   --  19.8*  ?HGB 11.0*  --   --  10.6*  ?HCT 34.7*  --   --  34.2*  ?NA 134*   < > 134* 136  ?K 4.8   < > 4.2 4.9  ?CL 103   < > 101 103  ?CO2 22   < >  22 24  ?BUN 60*   < > 65* 67*  ?CREATININE 2.00*   < > 2.02* 2.05*  ? < > = values in this interval not displayed.  ? ? ? ?Microbiology: ?Methicillin resistant staphylococcus aureus     ? MIC   ? CIPROFLOXACIN >=8 RESISTANT  Resistant   ? CLINDAMYCIN <=0.25 SENS... Sensitive   ? ERYTHROMYCIN >=8 RESISTANT  Resistant   ? GENTAMICIN <=0.5 SENSI... Sensitive   ? Inducible Clindamycin NEGATIVE  Sensitive   ? OXACILLIN >=4 RESISTANT  Resistant   ? RIFAMPIN <=0.5 SENSI... Sensitive   ? TETRACYCLINE >=16 RESIST... Resistant   ? TRIMETH/SULFA >=320 RESIS... Resistant   ? VANCOMYCIN 1 SENSITIVE  Sensitive   ? ?Studies/Results: ?US RENAL ? ?Result Date: 07/24/2021 ?CLINICAL DATA:  Acute renal failure. EXAM: RENAL / URINARY TRACT ULTRASOUND COMPLETE COMPARISON:  December 23, 2020. FINDINGS:  Right Kidney: Renal measurements: 12.7 x 5.7 x 5.5 cm = volume: 206 mL. Echogenicity within normal limits. No mass or hydronephrosis visualized. Left Kidney: Renal measurements: 11.3 x 5.7 x 5.1 cm = volume: 170 mL. Two simple cysts are noted, the largest measuring 2.6 cm. Echogenicity within normal limits. No mass or hydronephrosis visualized. Bladder: Appears normal for degree of bladder distention. Other: None. IMPRESSION: Simple left renal cysts.  No other renal abnormality is noted. Electronically Signed   By: Marijo Conception M.D.   On: 07/24/2021 08:07  ? ?DG CHEST PORT 1 VIEW ? ?Result Date: 07/23/2021 ?CLINICAL DATA:  A female age age 50 presents for evaluation of increased shortness of breath and cough for 1 day. EXAM: PORTABLE CHEST 1 VIEW COMPARISON:  July 21, 2021. FINDINGS: LEFT-sided dual lead pacer device remains in place. Power pack over the LEFT chest. Leads project over the cardiac silhouette. Image rotated slightly to the RIGHT. Accounting for this cardiomediastinal contour shows stable cardiac enlargement and central pulmonary vascular engorgement. No signs of lobar consolidation. No sign of pleural effusion on frontal radiograph. No visible pneumothorax. Linear opacities at the lung bases. On limited assessment there is no acute skeletal finding. IMPRESSION: Cardiomegaly with central pulmonary vascular engorgement. No signs of lobar consolidation or effusion. Favor scarring or atelectasis at the lung bases. Electronically Signed   By: Zetta Bills M.D.   On: 07/23/2021 16:35   ? ? ?Assessment/Plan: ? ?Septic arthritis with MRSA = treat with 6 wk with daptomycin. Will check sed rate and crp today. Needs IR line. ? ?Diagnosis: ?Septic arthritis/pre-patellar bursitis ? ?Culture Result: MRSA ? ?Allergies  ?Allergen Reactions  ? Dulaglutide Nausea Only, Other (See Comments) and Cough  ?  Made the patient sick to her stomach (only) several days after using it  ? Oxycodone Itching and Other (See  Comments)  ?  Pt still takes this med even thought it causes itching  ? Trelegy Ellipta [Fluticasone-Umeclidin-Vilant] Nausea Only, Other (See Comments) and Cough  ?  Made the patient sick to her stomach (only) several days after using it   ? ? ?OPAT Orders ?Discharge antibiotics to be given via PICC line ?Discharge antibiotics: ?Per pharmacy protocol daptomycin 36m/kg/qod ? ?Duration: ?6 wk ?End Date:may 3rd ? ?[facility] Per Protocol: ? ?Home health RN for IV administration and teaching; PICC line care and labs.   ? ?Labs weekly while on IV antibiotics: ?_x_ CBC with differential ?_x_ BMP ? ?_x_ CRP ?__x ESR ?_x_  CK ? ?_x_ Please pull PIC at completion of IV antibiotics ? ? ?Fax weekly labs to ([phone]? ?  Clinic Follow Up Appt: ?In 4 wk ? ?@ [facility] ? ? ?[name] ?[facility] for Infectious Diseases ?Pager: [phone] ? ?07/25/2021, 12:11 PM ? ? ? ? ? ?

## 2021-07-25 NOTE — Progress Notes (Signed)
?Progress Note ? ? ?Patient: Kayla [name] HAL:[id number] DOB: 03-Sep-1940 DOA: 07/21/2021     4 ?DOS: the patient was seen and examined on 07/25/2021 ?  ?Brief hospital course: ?Kayla [name] is an 81 y.o. female with a history of CHF, CKD stage 3b, COPD, chronic pain, CAD, diabetes mellitus on insulin, hypertension, paroxysmal atrial fibrillation. Patient presented secondary to worsening knee pain and erythema and found to have evidence of recurrent septic prepatellar bursitis. Empiric antibiotics initiated and orthopedic surgery consulted.  Patient underwent excisional l and non excisional debridement of right knee prepatellar space including a combined 5-6 inch incision, Hemovac drain was placed by Dr. Alvan Dame on 3/21. Hemovac removed.  ? ?ID consulted, recommend 6 weeks of IV antibiotics. Culture growing staph, awaiting sensitivity.  ? ?She develops worsening hyperglycemia 3/22. Transfer to Step down unit for insulin gtt. Transition to long acting insulin 3/23. ? ?Plan for tunnel IJ for home IV antibiotics.  ? ?Assessment and Plan: ?* Septic prepatellar bursitis of right knee ?Recurrent septic prepatellar Bursitis.  Associated leukocytosis with WBC of 27,800. Orthopedic surgery consulted, underwent  I&D 3/21.  ?-Hemovac removed 3/23. ?-Follow wound culture: MRSA  Staph Aureus.  ?-Follow-up blood cultures: No growth to date.  ?-ID consulted, Vancomycin change to Daptomycin die to AKI.  ?She will be discharge on Daptomycin for 6 weeks.  ?For centra line placement.  ?Plan  to keep one more day to monitor WBC>  ? ?Paroxysmal atrial fibrillation ([place]) ?Patient is on Eliquis, amiodarone and Toprol XL as an outpatient. ?-Continue amiodarone and Toprol XL ?-Continue with eliquis.  ? ?Hypertension associated with diabetes ([place]) ?Patient is on losartan, Toprol XL and amlodipine as an outpatient. ?-Continue Toprol XL and amlodipine. ?-Hold Cozaar due to AKI ? ?Insulin dependent type 2 diabetes mellitus ([place]) ?Patient is  on Lantus 110 units am and 90 at hs. > and Novolog sliding scale as an outpatient. ?- Hypoglycemia while in the ED with blood sugar of 53. ?-Develops Hyperglycemia, in setting of steroids, and Lantus was on hold.  ?-She was transfer to Step down 3/22 , started on insulin gtt. Subsequently  transition to semglee. ? and SSI.  ?-plan to increase semglee to 55 units BID>  ? ?Chronic kidney disease, stage 3b ([place]) ?Baseline creatinine of about 1.6--1.8 ?AKI on CKD stage 3b ?Patient creatinine increased to 1.2. ?Continue to  hold Cozaar on torsemide. ?Received IV fluids.  ?Renal US; simple renal cyst. No others abnormalities.  ?Cr down to 2.0 ?Nephrology consulted. Considering ATN.  ?Stable. Plan to resume torsemide at discharge, hold cozaar for one week then resume.  ? ?Pacemaker - MDT ?Noted. ? ?Chronic diastolic heart failure ([place]) ?Euvolemic. Patient is on torsemide as an outpatient. ?-Developed AKI, plan to hold torsemide.  Monitor volume status ?Plan to resume torsemide at discharge.  ? ?COPD (chronic obstructive pulmonary disease) ([employer]) ?Patient is on albuterol and Breztri as an outpatient. ?-Continue with albuterol TID  ?-Continue Breztri ?-Incentive spirometry. ?-Report chronic shortness of breath. ? ?OBSTRUCTIVE SLEEP APNEA ?-Continue CPAP ? ?OBESITY ?Body mass index is 38.27 kg/m?Marland Kitchen  ?Needs lifestyle modification ? ?Hyperlipidemia associated with type 2 diabetes mellitus ([place]) ?-Continue Lipitor ? ?HYPOTHYROIDISM, POSTSURGICAL ?-Continue Synthroid 200 mcg ? ? ? ? ?  ? ?Subjective: she would like to go home, denies dyspnea.  ? ?Physical Exam: ?Vitals:  ? 07/25/21 0409 07/25/21 0500 07/25/21 0515 07/25/21 0734  ?BP: (!) 170/82  (!) 158/66   ?Pulse: 80  60   ?Resp: 20     ?  Temp: 97.8 ?F (36.6 ?C)     ?TempSrc: Oral     ?SpO2: 98%   96%  ?Weight:  110.9 kg    ?Height:      ? ?General; NAD ?Lung; CTA ?Abdomen; soft, nt ?Leg; mild erythema.  ? ?Data Reviewed: ? ?Cbc bmet reviewed.  ? ?Family Communication: care  discussed with husband who was at bedside.  ? ?Disposition: ?Status is: Inpatient ?Remains inpatient appropriate because: management of pre patelar septic bursitis.  ? Planned Discharge Destination: Home 3/25 ? ? ? ?Time spent: 45 minutes ? ?Author: ?Elmarie Shiley, MD ?07/25/2021 3:24 PM ? ?For on call review [email or web address].  ?

## 2021-07-25 NOTE — TOC Progression Note (Signed)
Transition of Care (TOC) - Progression Note  ? ? ?Patient Details  ?Name: Kayla [name] ?MRN: [id number] ?Date of Birth: Nov 18, 1940 ? ?Transition of Care (TOC) CM/SW Contact  ?Ross Ludwig, LCSW ?Phone Number: ?07/25/2021, 6:12 PM ? ?Clinical Narrative:    ? ?CSW spoke with Pam at [facility] home infusion team.  She has been speaking to ID in regards to getting patient's IV antibiotics arranged.  [facility] was able to accept patient for home health services.  CSW spoke to patient's spouse Jeneen Rinks, and he was in agreement to having HH see patient.  CSW was informed that if patient is medically ready for discharge tomorrow, home health agency will be able to see patient on Sunday.  CSW updated attending physician.  CSW to continue to follow patient's progress throughout discharge planning. ? ?Expected Discharge Plan: [place] ?Barriers to Discharge: Continued Medical Work up ? ?Expected Discharge Plan and Services ?Expected Discharge Plan: [place] ?In-house Referral: Clinical Social Work ?  ?  ?Living arrangements for the past 2 months: [place] ?                ?  ?  ?  ?  ?  ?  ?  ?  ?  ?  ? ? ?Social Determinants of Health (SDOH) Interventions ?  ? ?Readmission Risk Interventions ? ?  07/23/2021  ? 12:10 PM 03/21/2021  ? 12:53 PM  ?Readmission Risk Prevention Plan  ?Transportation Screening Complete Complete  ?Medication Review Press photographer) Complete Complete  ?[place] or Home Care Consult Complete   ?SW Recovery Care/Counseling Consult Complete   ?Palliative Care Screening Not Applicable   ?Skilled Nursing Facility Complete   ? ? ?

## 2021-07-25 NOTE — Procedures (Signed)
Interventional Radiology Procedure Note ? ?Procedure: Tunneled CVC placement ? ?Complications: None ? ?Estimated Blood Loss: < 10 mL ? ?Findings: ?Right IJ 6Fr, DL tunneled Power Line placed and trimmed to 27 cm. Tip at SVC/RA junction. OK to use. ? ?Eulas Post T. Kathlene Cote, M.D ?Pager:  [phone] ? ?  ?

## 2021-07-25 NOTE — Progress Notes (Signed)
PHARMACY CONSULT NOTE FOR:  daptomycin ? ?OUTPATIENT  PARENTERAL ANTIBIOTIC THERAPY (OPAT) ? ?Indication: MRSA septic arthritis of knee ?Regimen: daptomycin '850mg'$  IV q48h ?End date: 09/03/2021 ? ?IV antibiotic discharge orders are pended. ?To discharging provider:  please sign these orders via discharge navigator,  ?Select New Orders & click on the button choice - Manage This Unsigned Work.  ?  ? ?Thank you for allowing pharmacy to be a part of this patient's care. ? ?Candie Mile ?07/25/2021, 12:21 PM ?

## 2021-07-25 NOTE — Progress Notes (Signed)
Physical Therapy Treatment ?Patient Details ?Name: Kayla [name] ?MRN: [id number] ?DOB: 1940-11-06 ?Today's Date: 07/25/2021 ? ? ?History of Present Illness 81 yo female admitted with R knee septic bursitis. s/p I&D 07/22/21. Recent admission for same, S/P I&D R knee 05/30/21. Hx of obesity, DM, OSA, COPD, HF, CKD, cellultis, L great toe amputation, L TKA, chronic pain, CAD ? ?  ?PT Comments  ? ? Pt is AxO x 3 motivated.  Pt was already OOB in recliner.  General transfer comment: Supervision for safety.  Pt a bit impulsive and honary.  She fell yesterday with nursing going to bathroom without calling out for assistance.General Gait Details: "I only walk when I need to" due to pain.  Pt has been amb to and from bathroom with staff.  Declined to amb in hallway.  Pt tolerated FWB thru R LE but states "everytime I get up, it hurts".  "look how red it is".  Returned to recliner and applied ICE.    ?Recommendations for follow up therapy are one component of a multi-disciplinary discharge planning process, led by the attending physician.  Recommendations may be updated based on patient status, additional functional criteria and insurance authorization. ? ?Follow Up Recommendations ? Follow physician's recommendations for discharge plan and follow up therapies ?  ?  ?Assistance Recommended at Discharge Intermittent Supervision/Assistance  ?Patient can return home with the following Help with stairs or ramp for entrance;Assist for transportation;A little help with bathing/dressing/bathroom;Assistance with cooking/housework ?  ?Equipment Recommendations ? None recommended by PT  ?  ?Recommendations for Other Services   ? ? ?  ?Precautions / Restrictions Precautions ?Precautions: Fall ?Precaution Comments: fell in hosp 3/23 (did not call for assist to BR) ?Restrictions ?Weight Bearing Restrictions: No ?RLE Weight Bearing: Weight bearing as tolerated  ?  ? ?Mobility ? Bed Mobility ?  ?  ?  ?  ?  ?  ?  ?General bed mobility  comments: OOB in recliner ?  ? ?Transfers ?Overall transfer level: Needs assistance ?Equipment used: Rolling walker (2 wheels) ?Transfers: Sit to/from Stand ?Sit to Stand: Supervision ?  ?  ?  ?  ?  ?General transfer comment: Supervision for safety.  Pt a bit impulsive and honary.  She fell yesterday with nursing going to bathroom without calling out for assistance. ?  ? ?Ambulation/Gait ?Ambulation/Gait assistance: Supervision ?Gait Distance (Feet): 22 Feet (11 feet x 2 to and from bathroom) ?Assistive device: Rolling walker (2 wheels) ?Gait Pattern/deviations: Step-through pattern ?Gait velocity: decreased ?  ?  ?General Gait Details: "I only walk when I need to" due to pain.  Pt has been amb to and from bathroom with staff.  Declined to amb in hallway.  Pt tolerated FWB thru R LE but states "everytime I get up, it hurts".  "look how red it is". ? ? ?Stairs ?  ?  ?  ?  ?  ? ? ?Wheelchair Mobility ?  ? ?Modified Rankin (Stroke Patients Only) ?  ? ? ?  ?Balance   ?  ?  ?  ?  ?  ?  ?  ?  ?  ?  ?  ?  ?  ?  ?  ?  ?  ?  ?  ? ?  ?Cognition Arousal/Alertness: Awake/alert ?Behavior During Therapy: [facility] for tasks assessed/performed ?Overall Cognitive Status: Within Functional Limits for tasks assessed ?  ?  ?  ?  ?  ?  ?  ?  ?  ?  ?  ?  ?  ?  ?  ?  ?  General Comments: AxO x 3 ?  ?  ? ?  ?Exercises   ? ?  ?General Comments   ?  ?  ? ?Pertinent Vitals/Pain Pain Assessment ?Pain Assessment: Faces ?Faces Pain Scale: Hurts even more ?Pain Location: R knee with activity ?Pain Descriptors / Indicators: Grimacing, Guarding, Throbbing ?Pain Intervention(s): Monitored during session, Premedicated before session, Repositioned, Ice applied  ? ? ?Home Living   ?  ?  ?  ?  ?  ?  ?  ?  ?  ?   ?  ?Prior Function    ?  ?  ?   ? ?PT Goals (current goals can now be found in the care plan section) Progress towards PT goals: Progressing toward goals ? ?  ?Frequency ? ? ? Min 3X/week ? ? ? ?  ?PT Plan Current plan remains appropriate   ? ? ?Co-evaluation   ?  ?  ?  ?  ? ?  ?AM-PAC PT "6 Clicks" Mobility   ?Outcome Measure ? Help needed turning from your back to your side while in a flat bed without using bedrails?: A Little ?Help needed moving from lying on your back to sitting on the side of a flat bed without using bedrails?: A Little ?Help needed moving to and from a bed to a chair (including a wheelchair)?: A Little ?Help needed standing up from a chair using your arms (e.g., wheelchair or bedside chair)?: A Little ?Help needed to walk in hospital room?: A Little ?Help needed climbing 3-5 steps with a railing? : A Little ?6 Click Score: 18 ? ?  ?End of Session Equipment Utilized During Treatment: Gait belt ?Activity Tolerance: Patient limited by pain ?Patient left: in chair;with call bell/phone within reach;with chair alarm set;with family/visitor present ?Nurse Communication: Mobility status ?PT Visit Diagnosis: Pain;Difficulty in walking, not elsewhere classified (R26.2);Muscle weakness (generalized) (M62.81) ?Pain - Right/Left: Right ?  ? ? ?Time: 5809-9833 ?PT Time Calculation (min) (ACUTE ONLY): 12 min ? ?Charges:  $Gait Training: 8-22 mins          ?          ? ?Rica Koyanagi  PTA ?Acute  Rehabilitation Services ?Pager      [phone] ?Office      [phone] ? ? ? ?

## 2021-07-25 NOTE — Plan of Care (Signed)
?  Problem: Health Behavior/Discharge Planning: ?Goal: Ability to manage health-related needs will improve ?Outcome: Progressing ?  ?Problem: Clinical Measurements: ?Goal: Ability to maintain clinical measurements within normal limits will improve ?Outcome: Progressing ?Goal: Will remain free from infection ?Outcome: Progressing ?Goal: Diagnostic test results will improve ?Outcome: Progressing ?Goal: Respiratory complications will improve ?Outcome: Progressing ?Goal: Cardiovascular complication will be avoided ?Outcome: Progressing ?  ?Problem: Activity: ?Goal: Risk for activity intolerance will decrease ?Outcome: Progressing ?  ?Problem: Nutrition: ?Goal: Adequate nutrition will be maintained ?Outcome: Progressing ?  ?Problem: Coping: ?Goal: Level of anxiety will decrease ?Outcome: Progressing ?  ?Problem: Elimination: ?Goal: Will not experience complications related to bowel motility ?Outcome: Progressing ?Goal: Will not experience complications related to urinary retention ?Outcome: Progressing ?  ?Problem: Pain Managment: ?Goal: General experience of comfort will improve ?Outcome: Progressing ?  ?Problem: Safety: ?Goal: Ability to remain free from injury will improve ?Outcome: Progressing ?  ?Problem: Skin Integrity: ?Goal: Risk for impaired skin integrity will decrease ?Outcome: Progressing ?  ?Problem: Education: ?Goal: Knowledge of General Education information will improve ?Description: Including pain rating scale, medication(s)/side effects and non-pharmacologic comfort measures ?Outcome: Progressing ?  ?Problem: Health Behavior/Discharge Planning: ?Goal: Ability to manage health-related needs will improve ?Outcome: Progressing ?  ?Problem: Clinical Measurements: ?Goal: Ability to maintain clinical measurements within normal limits will improve ?Outcome: Progressing ?Goal: Will remain free from infection ?Outcome: Progressing ?Goal: Diagnostic test results will improve ?Outcome: Progressing ?Goal:  Respiratory complications will improve ?Outcome: Progressing ?Goal: Cardiovascular complication will be avoided ?Outcome: Progressing ?  ?Problem: Activity: ?Goal: Risk for activity intolerance will decrease ?Outcome: Progressing ?  ?Problem: Nutrition: ?Goal: Adequate nutrition will be maintained ?Outcome: Progressing ?  ?Problem: Coping: ?Goal: Level of anxiety will decrease ?Outcome: Progressing ?  ?Problem: Elimination: ?Goal: Will not experience complications related to bowel motility ?Outcome: Progressing ?Goal: Will not experience complications related to urinary retention ?Outcome: Progressing ?  ?Problem: Pain Managment: ?Goal: General experience of comfort will improve ?Outcome: Progressing ?  ?Problem: Safety: ?Goal: Ability to remain free from injury will improve ?Outcome: Progressing ?  ?Problem: Skin Integrity: ?Goal: Risk for impaired skin integrity will decrease ?Outcome: Progressing ?  ?Problem: Education: ?Goal: Knowledge of the prescribed therapeutic regimen will improve ?Outcome: Progressing ?Goal: Individualized Educational Video(s) ?Outcome: Progressing ?  ?Problem: Activity: ?Goal: Ability to avoid complications of mobility impairment will improve ?Outcome: Progressing ?Goal: Range of joint motion will improve ?Outcome: Progressing ?  ?Problem: Clinical Measurements: ?Goal: Postoperative complications will be avoided or minimized ?Outcome: Progressing ?  ?Problem: Pain Management: ?Goal: Pain level will decrease with appropriate interventions ?Outcome: Progressing ?  ?Problem: Skin Integrity: ?Goal: Will show signs of wound healing ?Outcome: Progressing ?  ?

## 2021-07-25 NOTE — Progress Notes (Signed)
Patient ID: Kayla [name], female   DOB: 1940/10/28, 81 y.o.   MRN: [id number] ?Subjective: ?3 Days Post-Op Procedure(s) (LRB): ?INCISION AND DRAINAGE ABSCESS RIGHT KNEE (Right) ?  ? ?Patient reports pain as mild. ?Ready to go home ?Notes reviewed from Dr. Baxter Flattery with plans of Daptomycin for 4-6 weeks ?Waiting for placement of PIC line ? ?Objective:  ? ?VITALS:   ?Vitals:  ? 07/25/21 0409 07/25/21 0515  ?BP: (!) 170/82 (!) 158/66  ?Pulse: 80 60  ?Resp: 20   ?Temp: 97.8 ?F (36.6 ?C)   ?SpO2: 98%   ? ? ?Neurovascular intact ? ?Today I removed her operative dressing ?The pre-patellar space is and remains decompressed ?There is some reactive erythema ?No active drainage ?Wound relatively stable at this point, no signs of wound breakdown or skin edge necrosis ? ?LABS ?Recent Labs  ?  07/23/21 ?0331 07/24/21 ?0312 07/25/21 ?0453  ?HGB 9.2* 11.0* 10.6*  ?HCT 29.8* 34.7* 34.2*  ?WBC 14.8* 15.0* 19.8*  ?PLT 422* 444* 565*  ? ? ?Recent Labs  ?  07/24/21 ?0616 07/24/21 ?1001 07/25/21 ?0453  ?NA 134* 134* 136  ?K 4.6 4.2 4.9  ?BUN 62* 65* 67*  ?CREATININE 2.05* 2.02* 2.05*  ?GLUCOSE 175* 216* 240*  ? ? ?No results for input(s): LABPT, INR in the last 72 hours. ? ? ?Assessment/Plan: ?3 Days Post-Op Procedure(s) (LRB): ?INCISION AND DRAINAGE ABSCESS RIGHT KNEE (Right) ? ? ?Plan: ?Hopefully she can get a PIC placed today and arrangements made for IV antibiotics at home so she can go home today or tomorrow ? ?RTC to see me in 2 weeks for wound check and likely suture removal ?Please supple with Mepilex dressings ?  ?

## 2021-07-25 NOTE — Progress Notes (Signed)
[facility] ?Progress Note ? ?Subjective: seen in room, creat stable at 2.0 today.  ? ?Vitals:  ? 07/25/21 0409 07/25/21 0500 07/25/21 0515 07/25/21 0734  ?BP: (!) 170/82  (!) 158/66   ?Pulse: 80  60   ?Resp: 20     ?Temp: 97.8 ?F (36.6 ?C)     ?TempSrc: Oral     ?SpO2: 98%   96%  ?Weight:  110.9 kg    ?Height:      ? ? ?Exam: ?Gen alert, no distress ?No jvd or bruits ?Chest no rales, +wheezing, no rhonchi ?RRR no MRG ?Abd soft ntnd no mass or ascites +bs ?Ext no LE or UE edema ?Neuro is alert, Ox 3 , nf ?   ?  ? Home meds include - amiodarone, zyloprim, norvasc, eliquis, lipitor, breztri aerosphere 2 bid, neurontin, norco prn, novolog/ lantus insulins, synthroid, losartan 50, toprol xl 100 qd, torsemide 80 qd, prns/ vits/ supps ?  ?  UA 3/21 - 0-5 rbc/ wbc, 6-10 epi, >300 protein, cloudy, glu 50 ?  ?  ?  Date                           Creat               eGFR ?  2009- 2010                1.0- 1.4 ?  2013- 2018                1.08- 1.77 ?  2019- 2020                1.18- 1.38         ?  2021                          1.05- 1.82 ?  Jan-June 2022          1.78- 2.15 ?  July -dec 2022          1.48- 2.03 ?  Jan 2023                   1.49- 1.53        34- 35 ml/min, stage IIIb ?  3/20                           1.66 ?  3/21                           1.77 ?  07/23/21                      2.25                  ?  ? ?    CXR today - no edema, no sig congestion ?    Renal US - 11- 13 cm kidneys w/o hydro ?      UNa 28, UCr 68 ?  ?Assessment/ Plan: ?AKI on CKD 3b - b/l creatinine 1.49- 1.53 from Jan 2023, eGFR 34- 35 ml/min. Here creat 1.6 on admit 3/20 in setting of acute septic bursitis of the R knee. Went to OR 3/21. Creat bumped up to 2.2 on 3/22. UA unremarkable. [place] neg for obstruction.  Suspected AKI due to combination of IV vanc, ARB and  diuretics (all dc'd) causing nonoliguric ATN. Urine lytes c/w ATN. Creat better yesterday and today at 2.0.  At discharge would resume her torsemide at usual home dose.  Would hold her losartan (Cozaar) for one week then resume. I have explained this to the patient. We will set her up for appt w/ Dr Candiss Norse in 2-3 wks. Will sign off.  ?Septic bursitis - of the knee, sp I&D, getting daptomycin IV. Awaiting PICC then dc home.  ?PAF - on BB, amio, eliquis ?HTN/ volume - BP's normal to high, holding ARB. No vol excess on exam, repeat CXR showed no edema.  ?DM2 - per pmd ?Sp PPM ?Chronic diast CHF - no signs of vol overload ?COPD  ?  ?  ?  ? ?Rob Doctor, hospital ?07/25/2021, 7:43 AM ? ? ?Recent Labs  ?Lab 07/24/21 ?0312 07/24/21 ?0616 07/24/21 ?1001 07/25/21 ?0453  ?HGB 11.0*  --   --  10.6*  ?CALCIUM 8.7*   < > 8.5* 9.1  ?CREATININE 2.00*   < > 2.02* 2.05*  ?K 4.8   < > 4.2 4.9  ? < > = values in this interval not displayed.  ? ? ?Inpatient medications: ? albuterol  2.5 mg Nebulization TID  ? allopurinol  100 mg Oral BID  ? amiodarone  200 mg Oral QHS  ? amLODipine  10 mg Oral Daily  ? apixaban  2.5 mg Oral BID  ? chlorhexidine  15 mL Mouth Rinse BID  ? Chlorhexidine Gluconate Cloth  6 each Topical Daily  ? docusate sodium  100 mg Oral BID  ? ferrous sulfate  325 mg Oral Q breakfast  ? fluticasone  2 spray Each Nare Daily  ? gabapentin  300 mg Oral TID  ? insulin aspart  0-20 Units Subcutaneous TID WC  ? insulin aspart  0-5 Units Subcutaneous QHS  ? insulin aspart  8 Units Subcutaneous TID WC  ? insulin glargine-yfgn  50 Units Subcutaneous BID  ? levothyroxine  200 mcg Oral QAC breakfast  ? mouth rinse  15 mL Mouth Rinse q12n4p  ? metoprolol succinate  100 mg Oral Daily  ? mometasone-formoterol  2 puff Inhalation BID  ? And  ? umeclidinium bromide  1 puff Inhalation Daily  ? multivitamin with minerals  1 tablet Oral Daily  ? mupirocin ointment  1 application. Nasal BID  ? ? sodium chloride Stopped (07/24/21 0205)  ? DAPTOmycin (CUBICIN)  IV Stopped (07/23/21 1250)  ? methocarbamol (ROBAXIN) IV    ? ?sodium chloride, acetaminophen, bisacodyl, dextrose, diphenhydrAMINE, guaiFENesin-dextromethorphan,  hydrALAZINE, HYDROcodone-acetaminophen, HYDROcodone-acetaminophen, ipratropium-albuterol, menthol-cetylpyridinium **OR** phenol, methocarbamol **OR** methocarbamol (ROBAXIN) IV, metoCLOPramide **OR** metoCLOPramide (REGLAN) injection, morphine injection, ondansetron **OR** ondansetron (ZOFRAN) IV, polyethylene glycol, sodium chloride ? ? ? ? ? ? ?

## 2021-07-25 NOTE — Evaluation (Signed)
Occupational Therapy Evaluation ?Patient Details ?Name: Kayla [name] ?MRN: [id number] ?DOB: [birth year] ?Today's Date: 07/25/2021 ? ? ?History of Present Illness 81 yo female admitted with R knee septic bursitis. s/p I&D 07/22/21. Recent admission for same, S/P I&D R knee 05/30/21. Hx of obesity, DM, OSA, COPD, HF, CKD, cellultis, L great toe amputation, L TKA, chronic pain, CAD  ? ?Clinical Impression ?  ?Patient lives at home with spouse and is typically modified independent with functional ambulation using rolling walker. Patient has assistance with lower body dressing donning socks + shoes from spouse and he performs IADLs. Overall patient was supervision level for functional transfers and ambulation using walker. Patient supervision in standing for peri care/clothing management and washing hands at sink. Patient appears close to baseline, did report fall last night in hospital. Acute OT to follow, do not anticipate OT needs at D/C.  ?   ? ?Recommendations for follow up therapy are one component of a multi-disciplinary discharge planning process, led by the attending physician.  Recommendations may be updated based on patient status, additional functional criteria and insurance authorization.  ? ?Follow Up Recommendations ? Follow physician's recommendations for discharge plan and follow up therapies  ?  ?Assistance Recommended at Discharge Intermittent Supervision/Assistance  ?Patient can return home with the following A little help with walking and/or transfers;A little help with bathing/dressing/bathroom;Assistance with cooking/housework;Assist for transportation;Help with stairs or ramp for entrance ? ?  ?Functional Status Assessment ? Patient has had a recent decline in their functional status and demonstrates the ability to make significant improvements in function in a reasonable and predictable amount of time.  ?Equipment Recommendations ? None recommended by OT  ?  ?   ?Precautions / Restrictions  Precautions ?Precautions: Fall ?Precaution Comments: 1 fall 5-6 months ago, fell in hospital 3/23 ?Restrictions ?Weight Bearing Restrictions: No ?RLE Weight Bearing: Weight bearing as tolerated  ? ?  ? ?Mobility Bed Mobility ?  ?  ?  ?  ?  ?  ?  ?General bed mobility comments: In chair upon arrival ?  ? ? ? ?  ?Balance Overall balance assessment: Mild deficits observed, not formally tested ?  ?  ?  ?  ?  ?  ?  ?  ?  ?  ?  ?  ?  ?  ?  ?  ?  ?  ?   ? ?ADL either performed or assessed with clinical judgement  ? ?ADL Overall ADL's : Needs assistance/impaired ?Eating/Feeding: Independent ?  ?Grooming: Wash/dry hands;Supervision/safety;Standing ?  ?Upper Body Bathing: Set up;Sitting ?  ?Lower Body Bathing: Moderate assistance;Sitting/lateral leans;Sit to/from stand ?  ?Upper Body Dressing : Set up;Sitting ?  ?Lower Body Dressing: Moderate assistance;Sitting/lateral leans;Sit to/from stand ?Lower Body Dressing Details (indicate cue type and reason): Patient has assistance from spouse at baseline for socks/shoes ?Toilet Transfer: Supervision/safety;Ambulation;Rolling walker (2 wheels);Cueing for safety;Regular Toilet ?Toilet Transfer Details (indicate cue type and reason): Patient needs cues not to pull up on walker ?Toileting- Clothing Manipulation and Hygiene: Supervision/safety;Sitting/lateral lean;Sit to/from stand ?Toileting - Clothing Manipulation Details (indicate cue type and reason): Patient able to manage peri care and clothing management ?  ?  ?Functional mobility during ADLs: Supervision/safety;Rolling walker (2 wheels) ?General ADL Comments: Patient appears close to baseline however did have fall in hospital last night. O2 87-88% on room air post activity, recovered quickly with cues for pursed lip breathing to 94%  ? ? ? ? ?Pertinent Vitals/Pain Pain Assessment ?Pain Assessment: Faces ?Faces Pain Scale: Hurts a  little bit ?Pain Location: R knee ?Pain Descriptors / Indicators: Sore ?Pain Intervention(s):  Monitored during session  ? ? ? ?Hand Dominance Right ?  ?Extremity/Trunk Assessment Upper Extremity Assessment ?Upper Extremity Assessment: Overall WFL for tasks assessed ?  ?Lower Extremity Assessment ?Lower Extremity Assessment: Defer to PT evaluation ?  ?Cervical / Trunk Assessment ?Cervical / Trunk Assessment: Normal ?  ?Communication Communication ?Communication: No difficulties ?  ?Cognition Arousal/Alertness: Awake/alert ?Behavior During Therapy: [facility] for tasks assessed/performed ?Overall Cognitive Status: Within Functional Limits for tasks assessed ?  ?  ?  ?  ?  ?  ?  ?  ?  ?  ?  ?  ?  ?  ?  ?  ?  ?  ?  ?   ?   ?   ? ? ?Home Living Family/patient expects to be discharged to:: Private residence ?Living Arrangements: Spouse/significant other ?Available Help at Discharge: Family;Available 24 hours/day ?Type of Home: House ?Home Access: Stairs to enter ?Entrance Stairs-Number of Steps: 2 ?Entrance Stairs-Rails: Right ?Home Layout: One level ?  ?  ?Bathroom Shower/Tub: Tub/shower unit ?  ?Bathroom Toilet: Standard ?  ?  ?Home Equipment: Rollator (4 wheels);Rolling Walker (2 wheels);Cane - single point;Shower seat ?  ?  ?  ? ?  ?Prior Functioning/Environment Prior Level of Function : Needs assist ?  ?  ?  ?  ?  ?  ?Mobility Comments: Uses a walker for mobility ?ADLs Comments: Husband assists with donning socks and shoes as well as tub transfers. husband performs majority of IADLs ?  ? ?  ?  ?OT Problem List: Impaired balance (sitting and/or standing);Decreased activity tolerance;Decreased safety awareness ?  ?   ?OT Treatment/Interventions: Self-care/ADL training;Balance training;Patient/family education;Therapeutic activities  ?  ?OT Goals(Current goals can be found in the care plan section) Acute Rehab OT Goals ?Patient Stated Goal: Use the bathroom ?OT Goal Formulation: With patient ?Time For Goal Achievement: 08/08/21 ?Potential to Achieve Goals: Good  ?OT Frequency: Min 2X/week ?  ? ?   ?AM-PAC OT "6 Clicks"  Daily Activity     ?Outcome Measure Help from another person eating meals?: None ?Help from another person taking care of personal grooming?: A Little ?Help from another person toileting, which includes using toliet, bedpan, or urinal?: A Little ?Help from another person bathing (including washing, rinsing, drying)?: A Lot ?Help from another person to put on and taking off regular upper body clothing?: A Little ?Help from another person to put on and taking off regular lower body clothing?: A Lot ?6 Click Score: 17 ?  ?End of Session Equipment Utilized During Treatment: Rolling walker (2 wheels) ?Nurse Communication: Mobility status (O2 sats) ? ?Activity Tolerance: Patient tolerated treatment well ?Patient left: in chair;with call bell/phone within reach;with chair alarm set ? ?OT Visit Diagnosis: History of falling (Z91.81)  ?              ?Time: 2707-8675 ?OT Time Calculation (min): 17 min ?Charges:  OT General Charges ?$OT Visit: 1 Visit ?OT Evaluation ?$OT Eval Low Complexity: 1 Low ? ?Delbert Phenix OT ?OT pager: [phone] ? ?Rosemary Holms ?07/25/2021, 12:11 PM ?

## 2021-07-26 ENCOUNTER — Inpatient Hospital Stay (HOSPITAL_COMMUNITY): Payer: Medicare HMO

## 2021-07-26 LAB — GLUCOSE, CAPILLARY
Glucose-Capillary: 183 mg/dL — ABNORMAL HIGH (ref 70–99)
Glucose-Capillary: 226 mg/dL — ABNORMAL HIGH (ref 70–99)
Glucose-Capillary: 210 mg/dL — ABNORMAL HIGH (ref 70–99)
Glucose-Capillary: 154 mg/dL — ABNORMAL HIGH (ref 70–99)

## 2021-07-26 LAB — CULTURE, BLOOD (ROUTINE X 2)
Culture: NO GROWTH
Culture: NO GROWTH

## 2021-07-26 LAB — CBC
HCT: 31.6 % — ABNORMAL LOW (ref 36.0–46.0)
Hemoglobin: 9.9 g/dL — ABNORMAL LOW (ref 12.0–15.0)
MCH: 29.1 pg (ref 26.0–34.0)
MCHC: 31.3 g/dL (ref 30.0–36.0)
MCV: 92.9 fL (ref 80.0–100.0)
Platelets: 507 10*3/uL — ABNORMAL HIGH (ref 150–400)
RBC: 3.4 MIL/uL — ABNORMAL LOW (ref 3.87–5.11)
RDW: 15.8 % — ABNORMAL HIGH (ref 11.5–15.5)
WBC: 16.4 10*3/uL — ABNORMAL HIGH (ref 4.0–10.5)
nRBC: 0 % (ref 0.0–0.2)

## 2021-07-26 LAB — BASIC METABOLIC PANEL
Anion gap: 6 (ref 5–15)
BUN: 66 mg/dL — ABNORMAL HIGH (ref 8–23)
CO2: 24 mmol/L (ref 22–32)
Calcium: 8.6 mg/dL — ABNORMAL LOW (ref 8.9–10.3)
Chloride: 108 mmol/L (ref 98–111)
Creatinine, Ser: 1.83 mg/dL — ABNORMAL HIGH (ref 0.44–1.00)
GFR, Estimated: 28 mL/min — ABNORMAL LOW (ref >60)
Glucose, Bld: 203 mg/dL — ABNORMAL HIGH (ref 70–99)
Potassium: 5 mmol/L (ref 3.5–5.1)
Sodium: 138 mmol/L (ref 135–145)

## 2021-07-26 NOTE — Progress Notes (Signed)
?Progress Note ? ? ?Patient: Kayla [name] KPT:[id number] DOB: 1940-11-20 DOA: 07/21/2021     5 ?DOS: the patient was seen and examined on 07/26/2021 ?  ?Brief hospital course: ?Kayla [name] is an 81 y.o. female with a history of CHF, CKD stage 3b, COPD, chronic pain, CAD, diabetes mellitus on insulin, hypertension, paroxysmal atrial fibrillation. Patient presented secondary to worsening knee pain and erythema and found to have evidence of recurrent septic prepatellar bursitis. Empiric antibiotics initiated and orthopedic surgery consulted.  Patient underwent excisional l and non excisional debridement of right knee prepatellar space including a combined 5-6 inch incision, Hemovac drain was placed by Dr. Alvan Dame on 3/21. Hemovac removed.  ? ?ID consulted, recommend 6 weeks of IV antibiotics. Culture growing staph, awaiting sensitivity.  ? ?She develops worsening hyperglycemia 3/22. Transfer to Step down unit for insulin gtt. Transition to long acting insulin 3/23. ? ?Patient underwent tunnel IJ for home IV antibiotics placement 3/25. ? ?Patient was confuse overnight, try to remove central line. Plan to monitor overnight.  ? ?Assessment and Plan: ?* Septic prepatellar bursitis of right knee ?Recurrent septic prepatellar Bursitis.  Associated leukocytosis with WBC of 27,800. Orthopedic surgery consulted, underwent  I&D 3/21.  ?-Hemovac removed 3/23. ?-Follow wound culture: MRSA  Staph Aureus.  ?-Follow-up blood cultures: No growth to date.  ?-ID consulted, Vancomycin change to Daptomycin die to AKI.  ?-She will be discharge on Daptomycin for 6 weeks.  ?-she got confuse overnight, plan to monitor overnight, husband agrees.  ? ? ?Paroxysmal atrial fibrillation (HCC) ?Patient is on Eliquis, amiodarone and Toprol XL as an outpatient. ?-Continue amiodarone and Toprol XL ?-Continue with eliquis.  ? ?Hypertension associated with diabetes ([place]) ?Patient is on losartan, Toprol XL and amlodipine as an outpatient. ?-Continue  Toprol XL and amlodipine. ?-Hold Cozaar due to AKI ? ?Insulin dependent type 2 diabetes mellitus ([place]) ?Patient is on Lantus 110 units am and 90 at hs. > and Novolog sliding scale as an outpatient. ?- Hypoglycemia while in the ED with blood sugar of 53. ?-Develops Hyperglycemia, in setting of steroids, and Lantus was on hold.  ?-She was transfer to Step down 3/22 , started on insulin gtt. Subsequently  transition to semglee. ? and SSI.  ?-Continue with semglee to 55 units BID>  ?CBG better controlled.  ? ?Chronic kidney disease, stage 3b ([place]) ?Baseline creatinine of about 1.6--1.8 ?AKI on CKD stage 3b ?Patient creatinine increased to 1.2. ?Continue to  hold Cozaar on torsemide. ?Received IV fluids.  ?Renal US; simple renal cyst. No others abnormalities.  ?Cr down to 2.0--1.8 ?Nephrology consulted. Considering ATN.  ?Stable. Plan to resume torsemide at discharge, hold cozaar for one week then resume.  ? ?Pacemaker - MDT ?Noted. ? ?Chronic diastolic heart failure ([place]) ?-Euvolemic. Patient is on torsemide as an outpatient. ?-Developed AKI, torsemide held.  Monitor volume status ?Plan to resume torsemide at discharge.  ? ?COPD (chronic obstructive pulmonary disease) ([place]) ?Patient is on albuterol and Breztri as an outpatient. ?-Continue with albuterol TID  ?-Continue Breztri ?-Incentive spirometry. ?-Report chronic shortness of breath. ?-she report SOB that improved with oxygen supplementation. Plan to check oxygen on ambulation, to see if she qualifies for home oxygen.  ? ?OBSTRUCTIVE SLEEP APNEA ?-Continue CPAP ? ?OBESITY ?Body mass index is 38.27 kg/m?Marland Kitchen  ?Needs lifestyle modification ? ?Hyperlipidemia associated with type 2 diabetes mellitus ([place]) ?-Continue Lipitor ? ?HYPOTHYROIDISM, POSTSURGICAL ?-Continue Synthroid 200 mcg ? ? ? ? ?  ? ?Subjective: she is alert conversant. She doesn't remember  that she tried to remove central line.  ?She wants to go home today. I explain to her and husband I would like to make  sure she doesn't try to remove central line. I discussed option for SNF, she declines. Husbands agrees with one more night for observation.  ? ?Physical Exam: ?Vitals:  ? 07/26/21 0500 07/26/21 5027 07/26/21 0744 07/26/21 0749  ?BP:  (!) 172/55    ?Pulse:  (!) 59    ?Resp:  18    ?Temp:  (!) 97.4 ?F (36.3 ?C)    ?TempSrc:  Oral    ?SpO2:  98% 98% 98%  ?Weight: 111 kg     ?Height:      ? ?General; NAD ?Lungs; CTA ?Abdomen; soft, nt ?Extremities; right LE with less redness.  ? ?Data Reviewed: ? ?Cbc, bmet  ? ?Family Communication: Husband at bedside.  ? ?Disposition: ?Status is: Inpatient ?Remains inpatient appropriate because: plan to monitor overnight , to make sure she doesn't try to removes central line again.  ? Planned Discharge Destination: Home ? ? ? ?Time spent: 45 minutes ? ?Author: ?Elmarie Shiley, MD ?07/26/2021 1:22 PM ? ?For on call review [email or web address].  ?

## 2021-07-26 NOTE — Progress Notes (Signed)
Upon entering pts room, pt was sitting in her chair attempting to get up. She had self removed her oxygen & was very confused. She had completely removed her tunneled catheter dressing and attempted to remove the catheter itself & sutures.  ? ?RN reoriented pt on importance of not getting up on her own, & using her call bell & waiting for staffs assistance. RN reeducated pt on importance of wearing her chronic oxygen at all times, and importance of not removing her catheter dressing or attempting to remove the catheter itself.  ? ?IV team reconsulted to redress catheter. RN reapplied pts oxygen and assisted patient to bathroom and back to her chair. On call MD notified, orders placed for imaging to reconfirm correct catheter placement. ? ?Pt left in chair with chair alarm active & in use, call bell within reach, chair wheels locked, frequent rounding in place.  ?

## 2021-07-26 NOTE — Progress Notes (Signed)
?  SATURATION QUALIFICATIONS: (This note is used to comply with regulatory documentation for home oxygen) ? ?Patient Saturations on Room Air at Rest = 93% ? ?Patient Saturations on Room Air while Ambulating = 86% ? ?Patient Saturations on 2 Liters of oxygen after ambulation= 94% (patient could not tolerate continued ambulation today) ? ?Please briefly explain why patient needs home oxygen: Patient exhibited shortness of breath and fatigue within 8 feet and only able to tolerate 16 feet of ambulation. O2 sat dropped to 86% and exhibited dyspnea for several minutes before recovering. ?

## 2021-07-26 NOTE — Progress Notes (Signed)
Occupational Therapy Treatment ?Patient Details ?Name: Kayla [name] ?MRN: [id number] ?DOB: 1940/05/25 ?Today's Date: 07/26/2021 ? ? ?History of present illness 81 yo female admitted with R knee septic bursitis. s/p I&D 07/22/21. Recent admission for same, S/P I&D R knee 05/30/21. Hx of obesity, DM, OSA, COPD, HF, CKD, cellultis, L great toe amputation, L TKA, chronic pain, CAD ?  ?OT comments ? Treatment focused on functional mobility and monitoring oxygen saturations needed to mobilize for ADLs at home. Patient 94% on RA seated in chair but reports a recent history of difficulty breathing with ambulating at home. She is min guard to stand and ambulate with RW but within 8 feet began to exhibit dyspnea and wheezing and needing to stand and rest. She was able to ambulate another 8 feet but needed to quickly sit down due to shortness of breath and sudden fatigue. Patient's O2 sat 86% and needed to be rolled back to room because of dyspnea. With standing and transfer back to recliner patient had loss of balance - RLE appeared to partially buckle - but was able to stabilize on walker. Patient left in recliner on 2 L Bells to recover.   ? ?Recommendations for follow up therapy are one component of a multi-disciplinary discharge planning process, led by the attending physician.  Recommendations may be updated based on patient status, additional functional criteria and insurance authorization. ?   ?Follow Up Recommendations ? Home health OT  ?  ?Assistance Recommended at Discharge Frequent or constant Supervision/Assistance  ?Patient can return home with the following ? A little help with walking and/or transfers;A little help with bathing/dressing/bathroom;Assistance with cooking/housework;Assist for transportation;Help with stairs or ramp for entrance ?  ?Equipment Recommendations ? None recommended by OT  ?  ?Recommendations for Other Services   ? ?  ?Precautions / Restrictions Precautions ?Precautions: Fall ?Precaution  Comments: fell in hosp 3/23 (did not call for assist to BR) ?Restrictions ?Weight Bearing Restrictions: No ?RLE Weight Bearing: Weight bearing as tolerated  ? ? ?  ? ?Mobility Bed Mobility ?  ?  ?  ?  ?  ?  ?  ?  ?  ? ?Transfers ?  ?  ?  ?  ?  ?  ?  ?  ?  ?  ?  ?  ?Balance Overall balance assessment: Needs assistance ?Sitting-balance support: No upper extremity supported ?Sitting balance-Leahy Scale: Good ?  ?  ?Standing balance support: During functional activity, Reliant on assistive device for balance ?Standing balance-Leahy Scale: Poor ?  ?  ?  ?  ?  ?  ?  ?  ?  ?  ?  ?  ?   ? ?ADL either performed or assessed with clinical judgement  ? ?ADL   ?  ?  ?  ?  ?  ?  ?  ?  ?  ?  ?  ?  ?  ?  ?  ?  ?  ?  ?  ?General ADL Comments: Patient up in chair today and reports urinary urgency and unable to wait to ambulate to bathroom resulting in use of external catheter. RN requested walking sat test. Patient min guard to stand and ambulate with RW. At approx 8 feet patient wheezing and with dyspnea and had to stop. She ambulated 8 feet further with increased dyspnea and needed a chair to be pulled behind her to sit as she began to wobble. o2 sat 86% and patient with wheezing and 34 dyspnea. She reports needing to  ambulate further at home. On standing from chair to get back in recliner patient had a LOB - RLE appeared to partially buckle. Patient returned to seated postion to recover on 2 L . ?  ? ?Extremity/Trunk Assessment Upper Extremity Assessment ?Upper Extremity Assessment: Overall WFL for tasks assessed ?  ?  ?  ?  ?  ? ?Vision Patient Visual Report: No change from baseline ?  ?  ?Perception   ?  ?Praxis   ?  ? ?Cognition Arousal/Alertness: Awake/alert ?Behavior During Therapy: [facility] for tasks assessed/performed ?Overall Cognitive Status: Within Functional Limits for tasks assessed ?  ?  ?  ?  ?  ?  ?  ?  ?  ?  ?  ?  ?  ?  ?  ?  ?General Comments: A little forgetful ?  ?  ?   ?Exercises   ? ?  ?Shoulder Instructions    ? ? ?  ?General Comments    ? ? ?Pertinent Vitals/ Pain       Pain Assessment ?Pain Assessment: Faces ?Faces Pain Scale: Hurts a little bit ?Pain Location: R knee with activity ?Pain Descriptors / Indicators: Grimacing, Guarding ?Pain Intervention(s): Monitored during session ? ?Home Living   ?  ?  ?  ?  ?  ?  ?  ?  ?  ?  ?  ?  ?  ?  ?  ?  ?  ?  ? ?  ?Prior Functioning/Environment    ?  ?  ?  ?   ? ?Frequency ?    ? ? ? ? ?  ?Progress Toward Goals ? ?OT Goals(current goals can now be found in the care plan section) ? Progress towards OT goals: Progressing toward goals ? ?Acute Rehab OT Goals ?Patient Stated Goal: breathe better with walking ?OT Goal Formulation: With patient ?Time For Goal Achievement: 08/08/21 ?Potential to Achieve Goals: Good  ?Plan Discharge plan remains appropriate   ? ?Co-evaluation ? ? ?   ?  ?  ?  ?  ? ?  ?AM-PAC OT "6 Clicks" Daily Activity     ?Outcome Measure ? ? Help from another person eating meals?: None ?Help from another person taking care of personal grooming?: A Little ?Help from another person toileting, which includes using toliet, bedpan, or urinal?: A Little ?Help from another person bathing (including washing, rinsing, drying)?: A Lot ?Help from another person to put on and taking off regular upper body clothing?: A Little ?Help from another person to put on and taking off regular lower body clothing?: A Lot ?6 Click Score: 17 ? ?  ?End of Session Equipment Utilized During Treatment: Rolling walker (2 wheels) ? ?OT Visit Diagnosis: History of falling (Z91.81) ?  ?Activity Tolerance Patient tolerated treatment well ?  ?Patient Left in chair;with call bell/phone within reach;with chair alarm set;with family/visitor present ?  ?Nurse Communication Mobility status (o2 sat) ?  ? ?   ? ?Time: 9735-3299 ?OT Time Calculation (min): 27 min ? ?Charges: OT General Charges ?$OT Visit: 1 Visit ?OT Treatments ?$Therapeutic Activity: 23-37 mins ? ?Kayla [name], OTR/L ?Acute Care Rehab Services   ?Office [phone] ?Pager: [phone]  ? ?Kayla [name] ?07/26/2021, 4:23 PM ?

## 2021-07-27 DIAGNOSIS — M71161 Other infective bursitis, right knee: Secondary | ICD-10-CM | POA: Diagnosis not present

## 2021-07-27 LAB — AEROBIC/ANAEROBIC CULTURE W GRAM STAIN (SURGICAL/DEEP WOUND)

## 2021-07-27 LAB — GLUCOSE, CAPILLARY
Glucose-Capillary: 137 mg/dL — ABNORMAL HIGH (ref 70–99)
Glucose-Capillary: 108 mg/dL — ABNORMAL HIGH (ref 70–99)

## 2021-07-27 MED ORDER — HEPARIN SOD (PORK) LOCK FLUSH 100 UNIT/ML IV SOLN
250.0000 [IU] | INTRAVENOUS | Status: AC | PRN
  Administered 2021-07-27: 250 [IU]
  Filled 2021-07-27: qty 2.5

## 2021-07-27 MED ORDER — DAPTOMYCIN IV (FOR PTA / DISCHARGE USE ONLY): 850.0000 mg | INTRAVENOUS | 0 refills | Status: DC

## 2021-07-27 MED ORDER — INSULIN GLARGINE 100 UNIT/ML ~~LOC~~ SOLN
55.0000 [IU] | Freq: Two times a day (BID) | SUBCUTANEOUS | 11 refills | Status: DC
Start: 2021-07-27 — End: 2022-11-14

## 2021-07-27 MED ORDER — GABAPENTIN 300 MG PO CAPS: 300.0000 mg | ORAL_CAPSULE | Freq: Three times a day (TID) | ORAL | 0 refills | Status: DC

## 2021-07-27 MED ORDER — ALBUTEROL SULFATE (2.5 MG/3ML) 0.083% IN NEBU: 2.5000 mg | INHALATION_SOLUTION | Freq: Two times a day (BID) | RESPIRATORY_TRACT | Status: DC

## 2021-07-27 NOTE — Progress Notes (Signed)
Pt being discharged to home with oxygen and central line in place for IV antibiotics. Pt and husband at bedside educated on oxygen use at home. Discharge instructions and medication education provided to pt and husband.  ?

## 2021-07-27 NOTE — Discharge Summary (Signed)
?Physician Discharge Summary ?  ?Patient: Kayla [name] MRN: [id number] DOB: 1941/02/18  ?Admit date:     07/21/2021  ?Discharge date: 07/27/21  ?Discharge Physician: Jerald Kief A Savino Whisenant  ? ?PCP: Hamrick, Lorin Mercy, MD  ? ?Recommendations at discharge:  ? ?Needs to follow up with ortho for further care of Knee infection ?Needs to follow up with infection diseases for IV antibiotics.  ?Needs follow PCP in 1 week, check renal function if stable ok to resume cozaar.  ? ?Discharge Diagnoses: ?Principal Problem: ?  Septic prepatellar bursitis of right knee ?Active Problems: ?  Insulin dependent type 2 diabetes mellitus ([place]) ?  Hypertension associated with diabetes ([place]) ?  Paroxysmal atrial fibrillation (HCC) ?  HYPOTHYROIDISM, POSTSURGICAL ?  Hyperlipidemia associated with type 2 diabetes mellitus ([place]) ?  OBESITY ?  OBSTRUCTIVE SLEEP APNEA ?  COPD (chronic obstructive pulmonary disease) ([place]) ?  Chronic diastolic heart failure ([place]) ?  Pacemaker - MDT ?  Chronic kidney disease, stage 3b ([place]) ?  Coronary atherosclerosis ? ?Resolved Problems: ?  * No resolved hospital problems. * ? ?Hospital Course: ?Kayla [name] is an 81 y.o. female with a history of CHF, CKD stage 3b, COPD, chronic pain, CAD, diabetes mellitus on insulin, hypertension, paroxysmal atrial fibrillation. Patient presented secondary to worsening knee pain and erythema and found to have evidence of recurrent septic prepatellar bursitis. Empiric antibiotics initiated and orthopedic surgery consulted.  Patient underwent excisional l and non excisional debridement of right knee prepatellar space including a combined 5-6 inch incision, Hemovac drain was placed by Dr. Alvan Dame on 3/21. Hemovac removed.  ? ?ID consulted, recommend 6 weeks of IV antibiotics. Culture growing staph, awaiting sensitivity.  ? ?She develops worsening hyperglycemia 3/22. Transfer to Step down unit for insulin gtt. Transition to long acting insulin 3/23. ? ?Patient underwent tunnel IJ for  home IV antibiotics placement 3/25. ? ?Patient was confuse overnight, try to remove central line. No further confusion, she is stable. Plan for discharge today.  ? ?Assessment and Plan: ?* Septic prepatellar bursitis of right knee ?Recurrent septic prepatellar Bursitis.  Associated leukocytosis with WBC of 27,800. Orthopedic surgery consulted, underwent  I&D 3/21.  ?-Hemovac removed 3/23. ?-Follow wound culture: MRSA  Staph Aureus.  ?-Follow-up blood cultures: No growth to date.  ?-ID consulted, Vancomycin change to Daptomycin die to AKI.  ?-She will be discharge on Daptomycin for 6 weeks.  ?-stable for  discharge today. No further confusion.  ? ? ?Paroxysmal atrial fibrillation (HCC) ?Patient is on Eliquis, amiodarone and Toprol XL as an outpatient. ?-Continue amiodarone and Toprol XL ?-Continue with eliquis.  ? ?Hypertension associated with diabetes ([place]) ?Patient is on losartan, Toprol XL and amlodipine as an outpatient. ?-Continue Toprol XL and amlodipine. ?-Hold Cozaar due to AKI ? ?Insulin dependent type 2 diabetes mellitus ([place]) ?Patient is on Lantus 110 units am and 90 at hs. > and Novolog sliding scale as an outpatient. ?- Hypoglycemia while in the ED with blood sugar of 53. ?-Develops Hyperglycemia, in setting of steroids, and Lantus was on hold.  ?-She was transfer to Step down 3/22 , started on insulin gtt. Subsequently  transition to semglee. ? and SSI.  ?-Discharge on to 55 units BID>  ?CBG better controlled.  ? ?Chronic kidney disease, stage 3b ([place]) ?Baseline creatinine of about 1.6--1.8 ?AKI on CKD stage 3b ?Patient creatinine increased to 1.2. ?Continue to  hold Cozaar on torsemide. ?Received IV fluids.  ?Renal US; simple renal cyst. No others abnormalities.  ?Cr down  to 2.0--1.8 ?Nephrology consulted. Considering ATN.  ?Stable. Plan to resume torsemide at discharge, hold cozaar for one week then resume.  ? ?Pacemaker - MDT ?Noted. ? ?Chronic diastolic heart failure ([place]) ?-Euvolemic. Patient is on  torsemide as an outpatient. ?-Developed AKI, torsemide held.  Monitor volume status ?Plan to resume torsemide at discharge.  ? ?COPD (chronic obstructive pulmonary disease) ([place]) ?Patient is on albuterol and Breztri as an outpatient. ?-Continue with albuterol TID  ?-Continue Breztri ?-Incentive spirometry. ?-Report chronic shortness of breath. ?-she qualifies for home oxygen, CM will arrange.  ? ?OBSTRUCTIVE SLEEP APNEA ?-Continue CPAP ? ?OBESITY ?Body mass index is 38.27 kg/m?Marland Kitchen  ?Needs lifestyle modification ? ?Hyperlipidemia associated with type 2 diabetes mellitus ([place]) ?-Continue Lipitor ? ?HYPOTHYROIDISM, POSTSURGICAL ?-Continue Synthroid 200 mcg ? ? ? ? ?  ? ? ?Consultants: ID, Ortho ?Procedures performed: I and D of knee ?Disposition: Home ?Diet recommendation:  ?Discharge Diet Orders (From admission, onward)  ? ?  Start     Ordered  ? 07/27/21 0000  Diet - low sodium heart healthy       ? 07/27/21 4132  ? ?  ?  ? ?  ? ?Carb modified diet ?DISCHARGE MEDICATION: ?Allergies as of 07/27/2021   ? ?   Reactions  ? Dulaglutide Nausea Only, Other (See Comments), Cough  ? Made the patient sick to her stomach (only) several days after using it  ? Oxycodone Itching, Other (See Comments)  ? Pt still takes this med even thought it causes itching  ? Trelegy Ellipta [fluticasone-umeclidin-vilant] Nausea Only, Other (See Comments), Cough  ? Made the patient sick to her stomach (only) several days after using it   ? ?  ? ?  ?Medication List  ?  ? ?STOP taking these medications   ? ?atorvastatin 40 MG tablet ?Commonly known as: LIPITOR ?  ?benzonatate 200 MG capsule ?Commonly known as: TESSALON ?  ?gabapentin 800 MG tablet ?Commonly known as: NEURONTIN ?Replaced by: gabapentin 300 MG capsule ?  ?losartan 50 MG tablet ?Commonly known as: COZAAR ?  ? ?  ? ?TAKE these medications   ? ?acetaminophen 500 MG tablet ?Commonly known as: TYLENOL ?Take 1,000 mg by mouth every 6 (six) hours as needed for mild pain, moderate pain or  headache. ?  ?albuterol (2.5 MG/3ML) 0.083% nebulizer solution ?Commonly known as: PROVENTIL ?Take 3 mLs (2.5 mg total) by nebulization every 2 (two) hours as needed for wheezing or shortness of breath. ?What changed: when to take this ?  ?albuterol 108 (90 Base) MCG/ACT inhaler ?Commonly known as: Ventolin HFA ?Inhale 2 puffs into the lungs every 6 (six) hours as needed for wheezing or shortness of breath. ?What changed: Another medication with the same name was changed. Make sure you understand how and when to take each. ?  ?allopurinol 100 MG tablet ?Commonly known as: ZYLOPRIM ?Take 100 mg by mouth 2 (two) times daily. ?  ?amiodarone 200 MG tablet ?Commonly known as: PACERONE ?Take 1 tablet (200 mg total) by mouth daily. ?What changed: when to take this ?  ?amLODipine 5 MG tablet ?Commonly known as: NORVASC ?Take 1 tablet (5 mg total) by mouth daily. ?  ?apixaban 2.5 MG Tabs tablet ?Commonly known as: ELIQUIS ?Take 1 tablet (2.5 mg total) by mouth 2 (two) times daily. ?  ?ARTIFICIAL TEARS OP ?Place 1 drop into both eyes 3 (three) times daily as needed (for dryness). ?  ?ascorbic acid 500 MG tablet ?Commonly known as: VITAMIN C ?Take 500 mg by  mouth daily with lunch. ?  ?B-D SINGLE USE SWABS REGULAR Pads ?  ?Breztri Aerosphere 160-9-4.8 MCG/ACT Aero ?Generic drug: Budeson-Glycopyrrol-Formoterol ?INHALE 2 PUFFS INTO THE LUNGS IN THE MORNING AND AT BEDTIME. ?  ?daptomycin  IVPB ?Commonly known as: CUBICIN ?Inject 850 mg into the vein every other day. Indication:  MRSA septic arthritis of  ?First Dose: No ?Last Day of Therapy:  09/03/2021 ?Labs - Once weekly:  CBC/D, BMP, and CPK ?Labs - Every other week:  ESR and CRP ?Method of administration: IV Push ?Method of administration may be changed at the discretion of home infusion pharmacist based upon assessment of the patient and/or caregiver's ability to self-administer the medication ordered. ?  ?ferrous sulfate 325 (65 FE) MG tablet ?Take 1 tablet (325 mg total) by  mouth daily with breakfast. ?  ?fluticasone 50 MCG/ACT nasal spray ?Commonly known as: FLONASE ?USE 2 SPRAYS NASALLY DAILY ?What changed:  ?how much to take ?how to take this ?when to take this ?additiona

## 2021-07-27 NOTE — Progress Notes (Signed)
SATURATION QUALIFICATIONS: (This note is used to comply with regulatory documentation for home oxygen) ? ?Patient Saturations on Room Air at Rest = 93% ? ?Patient Saturations on Room Air while Ambulating = 86% ? ?Patient Saturations on 2 Liters of oxygen while Ambulating = 90% ? ?Please briefly explain why patient needs home oxygen: ?

## 2021-07-29 ENCOUNTER — Other Ambulatory Visit: Payer: Self-pay

## 2021-07-29 DIAGNOSIS — I480 Paroxysmal atrial fibrillation: Secondary | ICD-10-CM | POA: Diagnosis not present

## 2021-07-29 DIAGNOSIS — I5032 Chronic diastolic (congestive) heart failure: Secondary | ICD-10-CM | POA: Diagnosis not present

## 2021-07-29 DIAGNOSIS — I152 Hypertension secondary to endocrine disorders: Secondary | ICD-10-CM | POA: Diagnosis not present

## 2021-07-29 DIAGNOSIS — E1159 Type 2 diabetes mellitus with other circulatory complications: Secondary | ICD-10-CM | POA: Diagnosis not present

## 2021-07-29 DIAGNOSIS — J449 Chronic obstructive pulmonary disease, unspecified: Secondary | ICD-10-CM | POA: Diagnosis not present

## 2021-07-29 DIAGNOSIS — L02415 Cutaneous abscess of right lower limb: Secondary | ICD-10-CM | POA: Diagnosis not present

## 2021-07-29 DIAGNOSIS — M7041 Prepatellar bursitis, right knee: Secondary | ICD-10-CM | POA: Diagnosis not present

## 2021-07-29 DIAGNOSIS — N1832 Chronic kidney disease, stage 3b: Secondary | ICD-10-CM | POA: Diagnosis not present

## 2021-07-29 DIAGNOSIS — L03115 Cellulitis of right lower limb: Secondary | ICD-10-CM | POA: Diagnosis not present

## 2021-07-29 NOTE — Patient Outreach (Signed)
?[facility]) Care Management ?Telephonic RN Care Manager Note ? ? ?07/29/2021 ?Name:  Kayla [name] MRN:  [id number] DOB:  1940/07/26 ? ?Summary: ?Telephone call to patient for hospital follow up.  Patient states she had a visit from the nurse today for her infusion.  He husband will be doing the infusion QOD. Paitent has central line in place to right chest.  Discussed signs of infection to right knee site and central line.  Patient denies any signs of infection. Patient wanting to end call.  Patient did agree to call next week.   ? ? ?Subjective: ?Kayla [name] is an 81 y.o. year old female who is a primary patient of Hamrick, Lorin Mercy, MD. The care management team was consulted for assistance with care management and/or care coordination needs.   ? ?Telephonic RN Care Manager completed Telephone Visit today. ? ?Objective:  ? ?Medications Reviewed Today   ? ? Reviewed by Jon Billings, RN (Case Manager) on 07/29/21 at 1248  Med List Status: <None>  ? ?Medication Order Taking? Sig Documenting Provider Last Dose Status Informant  ?acetaminophen (TYLENOL) 500 MG tablet [id number] Yes Take 1,000 mg by mouth every 6 (six) hours as needed for mild pain, moderate pain or headache. [provider] Taking Active Self  ?albuterol (PROVENTIL) (2.5 MG/3ML) 0.083% nebulizer solution [id number] Yes Take 3 mLs (2.5 mg total) by nebulization every 2 (two) hours as needed for wheezing or shortness of breath.  ?Patient taking differently: Take 2.5 mg by nebulization in the morning, at noon, and at bedtime.  ? Chesley Mires, MD Taking Active Self  ?albuterol (VENTOLIN HFA) 108 (90 Base) MCG/ACT inhaler [id number] Yes Inhale 2 puffs into the lungs every 6 (six) hours as needed for wheezing or shortness of breath. Tanda Rockers, MD Taking Active Self  ?Alcohol Swabs (B-D SINGLE USE SWABS REGULAR) PADS [id number] Yes  [provider] Taking Active Self  ?allopurinol (ZYLOPRIM) 100 MG tablet  [id number] Yes Take 100 mg by mouth 2 (two) times daily. [provider] Taking Active Self  ?amiodarone (PACERONE) 200 MG tablet [id number] Yes Take 1 tablet (200 mg total) by mouth daily.  ?Patient taking differently: Take 200 mg by mouth at bedtime.  ? Deboraha Sprang, MD Taking Active Self  ?amLODipine (NORVASC) 5 MG tablet [id number] Yes Take 1 tablet (5 mg total) by mouth daily. Jerline Pain, MD Taking Active Self  ?apixaban (ELIQUIS) 2.5 MG TABS tablet [id number] Yes Take 1 tablet (2.5 mg total) by mouth 2 (two) times daily. Jerline Pain, MD Taking Active Self  ?ascorbic acid (VITAMIN C) 500 MG tablet [id number] Yes Take 500 mg by mouth daily with lunch. [provider] Taking Active Self  ?Blood Glucose Calibration (TRUE METRIX LEVEL 1) Low SOLN [id number] Yes  [provider] Taking Active Self  ?BREZTRI AEROSPHERE 160-9-4.8 MCG/ACT AERO [id number] Yes INHALE 2 PUFFS INTO THE LUNGS IN THE MORNING AND AT BEDTIME. Chesley Mires, MD Taking Active Self  ?Carboxymethylcellulose Sodium (ARTIFICIAL TEARS OP) [id number]  Place 1 drop into both eyes 3 (three) times daily as needed (for dryness). [provider]  Active Self  ?daptomycin (CUBICIN) IVPB [id number] Yes Inject 850 mg into the vein every other day. Indication:  MRSA septic arthritis of  ?First Dose: No ?Last Day of Therapy:  09/03/2021 ?Labs - Once weekly:  CBC/D, BMP, and CPK ?Labs - Every other week:  ESR and CRP ?Method of administration: IV Push ?Method of administration may be changed  at the discretion of home infusion pharmacist based upon assessment of the patient and/or caregiver's ability to self-administer the medication ordered. Elmarie Shiley, MD Taking Active   ?Dextromethorphan-guaiFENesin (TUSSIN DM MAX SUGAR-FREE PO) [id number] Yes Take 5 mLs by mouth every 4 (four) hours as needed (for coughing). [provider] Taking Active Self  ?ferrous sulfate 325 (65 FE) MG tablet [id number] Yes Take 1 tablet  (325 mg total) by mouth daily with breakfast. Deboraha Sprang, MD Taking Active Self  ?fluticasone (FLONASE) 50 MCG/ACT nasal spray [id number] Yes USE 2 SPRAYS NASALLY DAILY  ?Patient taking differently: Place 2 sprays into both nostrils daily.  ? Collene Gobble, MD Taking Active Self  ?gabapentin (NEURONTIN) 300 MG capsule [id number] Yes Take 1 capsule (300 mg total) by mouth 3 (three) times daily. Elmarie Shiley, MD Taking Active   ?HYDROcodone-acetaminophen (NORCO) 7.5-325 MG tablet [id number] Yes Take 1 tablet by mouth every 6 (six) hours as needed for moderate pain. [provider] Taking Active Self  ?insulin aspart (NOVOLOG) 100 UNIT/ML FlexPen [id number] Yes Inject 5-15 Units into the skin See admin instructions. Inject 5-15 units into the skin three times a day with meals, per sliding scale (and an additional 1 unit for every 50 points for a BGL >130) [provider] Taking Active Self  ?         ?Med Note Maud Deed   Tue Mar 18, 2021  9:33 PM)    ?insulin glargine (LANTUS) 100 UNIT/ML injection [id number] Yes Inject 0.55 mLs (55 Units total) into the skin 2 (two) times daily. Regalado, Cassie Freer, MD Taking Active   ?levothyroxine (SYNTHROID) 200 MCG tablet [id number] Yes Take 200 mcg by mouth daily before breakfast. [provider] Taking Active Self  ?metoprolol succinate (TOPROL-XL) 100 MG 24 hr tablet [id number] Yes TAKE 1 TABLET EVERY DAY WITH OR IMMEDIATELY AFTER A MEAL  ?Patient taking differently: Take 100 mg by mouth daily.  ? Deboraha Sprang, MD Taking Active Self  ?naloxone [facility]) nasal spray 4 mg/0.1 mL [id number] Yes Place 4 mg into the nose once as needed for opioid reversal. [provider] Taking Active Self  ?         ?Med Note Chanetta Marshall Jul 07, 2021  7:46 AM)    ?nitroGLYCERIN (NITROSTAT) 0.4 MG SL tablet [id number] Yes Place 1 tablet (0.4 mg total) under the tongue every 5 (five) minutes as needed for chest pain. Jerline Pain, MD  Taking Active Self  ?         ?Med Note Joellyn Haff May 29, 2021  3:38 PM)    ?ondansetron (ZOFRAN) 4 MG tablet [id number] Yes Take 4 mg by mouth every 8 (eight) hours as needed for nausea or vomiting. [provider] Taking Active Self  ?polyethylene glycol (MIRALAX / GLYCOLAX) 17 g packet [id number] Yes Take 17 g by mouth daily as needed for mild constipation. Kayleen Memos, DO Taking Active Self  ?PRESCRIPTION MEDICATION [id number] Yes CPAP- At bedtime and as needed during any time of rest (if shortness of breath is present) [provider] Taking Active Self  ?torsemide (DEMADEX) 20 MG tablet [id number] Yes Take 80 mg by mouth daily. [provider] Taking Active Self  ?TRUE METRIX BLOOD GLUCOSE TEST test strip [id number] Yes  [provider] Taking Active Self  ?TRUEplus Lancets [place] [id number] Yes  [provider] Taking Active Self  ?vitamin B-12 (CYANOCOBALAMIN) 500 MCG  tablet [id number] Yes Take 500 mcg by mouth daily. [provider] Taking Active Self  ?Vitamin D3 (VITAMIN D) 25 MCG tablet [id number] Yes Take 1,000 Units by mouth every morning. [provider] Taking Active Self  ?Med List Note Jeannette How 05/29/21 1532): Cpap  ? ?  ?  ? ?  ? ? ? ?SDOH:  (Social Determinants of Health) assessments and interventions performed:  ? ? ? ?Care Plan ? ?Review of patient past medical history, allergies, medications, health status, including review of consultants reports, laboratory and other test data, was performed as part of comprehensive evaluation for care management services.  ? ?Care Plan : RN Care Manager Plan of Kayla [name]  ?Updates made by Jon Billings, RN since 07/29/2021 12:00 AM  ?  ? ?Problem: Chronic Disease Management and Care Coordination for COPD   ?  ? ?Long-Range Goal: Development of Plan of Care for COPD   ?Start Date: 03/26/2021  ?Expected End Date: 05/03/2022  ?This Visit's Progress: On track  ?Recent Progress:  On track  ?Priority: High  ?Note:   ?Current Barriers:  ?Knowledge Deficits related to plan of care for management of COPD  ?Chronic Disease Management support and education needs related to COPD ? ?RNCM Cli

## 2021-07-29 NOTE — Patient Instructions (Addendum)
Patient Goals/Self-Care Activities: Right knee infection and pain ?Take all medications as prescribed ?Attend all scheduled provider appointments ?Call provider office for new concerns or questions  ?Take antibiotics as prescribed ?Monitor for swelling, redness, pain, pus, and fever ?Keep wound clean and dry.   ?Notify physician immediately for any changes  ? ?Patient Goals/Self-Care Activities: COPD ?Take medications as prescribed   ?Call provider office for new concerns or questions  ?- follow rescue plan if symptoms flare-up ?- use devices that will help like a cane, sock-puller or reacher ?

## 2021-07-29 NOTE — Patient Outreach (Signed)
[facility] [place]) Care Management ? ?07/29/2021 ? ?ELETHA CULBERTSON ?07/21/1940 ?[id number] ? ? ?Telephone call to patient foe follow up. Patient states that the infusion nurse just left and she has not eaten.  Advised patient that CM would call back another time. ? ?Plan: RN CM will attempt again within 4 days. ? ?[name], RN, MSN ?[facility] Care Management ?Care Management Coordinator ?Direct Line [phone] ?Toll Free: [phone]  ?Fax: [phone] ? ?

## 2021-07-30 DIAGNOSIS — E1140 Type 2 diabetes mellitus with diabetic neuropathy, unspecified: Secondary | ICD-10-CM | POA: Diagnosis not present

## 2021-07-30 DIAGNOSIS — Z7689 Persons encountering health services in other specified circumstances: Secondary | ICD-10-CM | POA: Diagnosis not present

## 2021-07-30 DIAGNOSIS — J9611 Chronic respiratory failure with hypoxia: Secondary | ICD-10-CM | POA: Diagnosis not present

## 2021-07-30 DIAGNOSIS — Z79899 Other long term (current) drug therapy: Secondary | ICD-10-CM | POA: Diagnosis not present

## 2021-07-30 DIAGNOSIS — L03115 Cellulitis of right lower limb: Secondary | ICD-10-CM | POA: Diagnosis not present

## 2021-07-30 DIAGNOSIS — M71161 Other infective bursitis, right knee: Secondary | ICD-10-CM | POA: Diagnosis not present

## 2021-07-30 DIAGNOSIS — J449 Chronic obstructive pulmonary disease, unspecified: Secondary | ICD-10-CM | POA: Diagnosis not present

## 2021-08-02 DIAGNOSIS — M71161 Other infective bursitis, right knee: Secondary | ICD-10-CM | POA: Diagnosis not present

## 2021-08-04 DIAGNOSIS — L03115 Cellulitis of right lower limb: Secondary | ICD-10-CM | POA: Diagnosis not present

## 2021-08-04 DIAGNOSIS — L02415 Cutaneous abscess of right lower limb: Secondary | ICD-10-CM | POA: Diagnosis not present

## 2021-08-06 ENCOUNTER — Other Ambulatory Visit: Payer: Self-pay

## 2021-08-06 NOTE — Patient Outreach (Signed)
[facility]) Care Management ? ?08/06/2021 ? ?JAMICIA HAALAND ?13-Aug-1940 ?[id number] ? ? ?Telephone call to patient for follow up.  Patient reports she is doing better.  She states that her knee is getting better. Surgeon follow up tomorrow.  Daptomycin infusion continues everyday  other day.  Home health nurse visit weekly for central line dressing change.  COPD management continues with oxygen dependency.  No concerns.   ? ?. ?Care Plan : RN Care Manager Plan of [name]  ?Updates made by Jon Billings, RN since 08/06/2021 12:00 AM  ?  ? ?Problem: Chronic Disease Management and Care Coordination for COPD   ?  ? ?Long-Range Goal: Development of Plan of Care for COPD   ?Start Date: 03/26/2021  ?Expected End Date: 05/03/2022  ?Recent Progress: On track  ?Priority: High  ?Note:   ?Current Barriers:  ?Knowledge Deficits related to plan of care for management of COPD  ?Chronic Disease Management support and education needs related to COPD ? ?RNCM Clinical Goal(s):  ?Patient will verbalize understanding of plan for management of COPD as evidenced by No exacerbation of COPD ?take all medications exactly as prescribed and will call provider for medication related questions as evidenced by patient reports of medication compliance    ?continue to work with Consulting civil engineer and/or Social Worker to address care management and care coordination needs related to COPD as evidenced by adherence to CM Team Scheduled appointments     through collaboration with Consulting civil engineer, provider, and care team.  ? ?Interventions: ?Education provided to patient on COPD ?Inter-disciplinary care team collaboration (see longitudinal plan of care) ?Evaluation of current treatment plan related to  self management and patient's adherence to plan as established by provider ?Encouraged patient to pace self with activity. ?Reviewed importance of medication compliance. ?Reviewed when to call for physician for increased shortness of breath,  increased sputum, fatigue that is not his normal, cough, fever or sick contacts.  ? ? ?08/06/21 Patient continues to need oxygen.  Patient learning to use oxygen despite not wanting to.  Discussed COPD management and when to notify physician early.   ? ?Patient Goals/Self-Care Activities: COPD ?Take medications as prescribed   ?Call provider office for new concerns or questions  ?- follow rescue plan if symptoms flare-up ?- use devices that will help like a cane, sock-puller or reacher ? ? ?Follow Up Plan:  Telephone follow up appointment with care management team member scheduled for:  within the month of April ?The patient has been provided with contact information for the care management team and has been advised to call with any health related questions or concerns.  ? ? ? ?  ? ?Long-Range Goal: Development of POC for Mgmt of right knee infection and pain   ?Start Date: 07/29/2021  ?Expected End Date: 05/19/2022  ?This Visit's Progress: On track  ?Recent Progress: On track  ?Priority: High  ?Note:   ?Current Barriers:  ?Knowledge Deficits related to plan of care for management of right knee infection and chronic pain  ? ?RNCM Clinical Goal(s):  ?Patient will verbalize understanding of plan for management of right knee infection and chronic pain as evidenced by no signs of infection to right knee and reporting acceptable level of pain ?take all medications exactly as prescribed and will call provider for medication related questions as evidenced by med adherence/compliance ?continue to work with RN Care Manager to address care management and care coordination needs related to  right knee infection and chronic  pain as evidenced by adherence to CM Team Scheduled appointments through collaboration with RN Care manager, provider, and care team  ? ?Interventions: ?POC sent to PCP upon initial assessment, quarterly and with any changes in patient's conditions ?Inter-disciplinary care team collaboration (see longitudinal  plan of care) ?Evaluation of current treatment plan related to  self management and patient's adherence to plan as established by provider ? ? ?Pain Interventions:  (Status:  Goal on track:  Yes.) Long Term Goal ?Pain assessment performed ?Medications reviewed ?Reviewed provider established plan for pain management ?Discussed importance of adherence to all scheduled medical appointments ?Counseled on the importance of reporting any/all new or changed pain symptoms or management strategies to pain management provider ?Advised patient to report to care team affect of pain on daily activities ?Reviewed with patient prescribed pharmacological and nonpharmacological pain relief strategies ?Infection Interventions: ?Reviewed signs of infection such as redness, swelling or drainage from site. ?Encouraged patient to keep dressing and wound clean and dry ?Notify physician for any changes.   ?Follow up with orthopedic doctor.   ? ?07/29/21 Patient recently discharged from the hospital for a right knee infection. Patient states that the doctor opened it up and cleaned it out. She now has a central line and antibiotics daily.  She states the nurse came out today to give infusion and taught her husband how to administer it QOD. Patient states she is walking with her walker but has some pain to her knee.  She is taking hydrocodone for pain. She states she has follow up appointments scheduled with her PCP and orthopedic.  She did not give dates but says her husband will be transporting her.    ? ?08/06/21 Patient reports doing good.  She reports that her knee looks good.  She goes to surgeon for sutures to be removed.  No drainage to site.  No concerns.   ? ? ?Patient Goals/Self-Care Activities: Right knee infection and pain ?Take all medications as prescribed ?Attend all scheduled provider appointments ?Call provider office for new concerns or questions  ?Take antibiotics as prescribed ?Monitor for swelling, redness, pain, pus, and  fever ?Keep wound clean and dry.   ?Notify physician immediately for any changes  ? ? ?Follow Up Plan:  Telephone follow up appointment with care management team member scheduled for:  within the month of April ?The patient has been provided with contact information for the care management team and has been advised to call with any health related questions or concerns.   ?  ? ?Plan: Follow-up: Patient agrees to Care Plan and Follow-up. ?Follow-up again in the month of April.   ? ?[name], RN, MSN ?[facility] Care Management ?Care Management Coordinator ?Direct Line [phone] ?Toll Free: [phone]  ?Fax: [phone] ? ?

## 2021-08-06 NOTE — Patient Instructions (Addendum)
Patient Goals/Self-Care Activities: Right knee infection and pain ?Take all medications as prescribed ?Attend all scheduled provider appointments ?Call provider office for new concerns or questions  ?Take antibiotics as prescribed ?Monitor for swelling, redness, pain, pus, and fever ?Keep wound clean and dry.   ?Notify physician immediately for any changes  ? ?Patient Goals/Self-Care Activities: COPD ?Take medications as prescribed   ?Call provider office for new concerns or questions  ?- follow rescue plan if symptoms flare-up ?- use devices that will help like a cane, sock-puller or reacher ? ?

## 2021-08-07 ENCOUNTER — Other Ambulatory Visit: Payer: Self-pay | Admitting: Internal Medicine

## 2021-08-11 ENCOUNTER — Other Ambulatory Visit: Payer: Self-pay | Admitting: Internal Medicine

## 2021-08-11 DIAGNOSIS — L02415 Cutaneous abscess of right lower limb: Secondary | ICD-10-CM | POA: Diagnosis not present

## 2021-08-11 DIAGNOSIS — L03115 Cellulitis of right lower limb: Secondary | ICD-10-CM | POA: Diagnosis not present

## 2021-08-12 DIAGNOSIS — M71161 Other infective bursitis, right knee: Secondary | ICD-10-CM | POA: Diagnosis not present

## 2021-08-14 DIAGNOSIS — I129 Hypertensive chronic kidney disease with stage 1 through stage 4 chronic kidney disease, or unspecified chronic kidney disease: Secondary | ICD-10-CM | POA: Diagnosis not present

## 2021-08-14 DIAGNOSIS — R809 Proteinuria, unspecified: Secondary | ICD-10-CM | POA: Diagnosis not present

## 2021-08-14 DIAGNOSIS — N2581 Secondary hyperparathyroidism of renal origin: Secondary | ICD-10-CM | POA: Diagnosis not present

## 2021-08-14 DIAGNOSIS — D631 Anemia in chronic kidney disease: Secondary | ICD-10-CM | POA: Diagnosis not present

## 2021-08-14 DIAGNOSIS — R609 Edema, unspecified: Secondary | ICD-10-CM | POA: Diagnosis not present

## 2021-08-14 DIAGNOSIS — N1832 Chronic kidney disease, stage 3b: Secondary | ICD-10-CM | POA: Diagnosis not present

## 2021-08-14 DIAGNOSIS — E1122 Type 2 diabetes mellitus with diabetic chronic kidney disease: Secondary | ICD-10-CM | POA: Diagnosis not present

## 2021-08-15 ENCOUNTER — Other Ambulatory Visit: Payer: Self-pay

## 2021-08-15 NOTE — Patient Outreach (Signed)
[facility]) Care Management ? ?08/15/2021 ? ?CARYSSA ELZEY ?11/02/1940 ?[id number] ? ? ?Telephone call to patient for follow up.  No answer.  Unable to leave a message. ? ?Plan: RN CM will attempt again within 4 business days.  ? ?[name], RN, MSN ?[facility] Care Management ?Care Management Coordinator ?Direct Line [phone] ?Toll Free: [phone]  ?Fax: [phone] ? ?

## 2021-08-18 ENCOUNTER — Ambulatory Visit: Payer: Medicare HMO | Admitting: Internal Medicine

## 2021-08-18 ENCOUNTER — Other Ambulatory Visit: Payer: Self-pay

## 2021-08-18 ENCOUNTER — Telehealth: Payer: Self-pay

## 2021-08-18 VITALS — BP 144/76 | HR 67 | Temp 97.6°F | Resp 16

## 2021-08-18 DIAGNOSIS — B9562 Methicillin resistant Staphylococcus aureus infection as the cause of diseases classified elsewhere: Secondary | ICD-10-CM | POA: Diagnosis not present

## 2021-08-18 DIAGNOSIS — M71161 Other infective bursitis, right knee: Secondary | ICD-10-CM

## 2021-08-18 DIAGNOSIS — M7989 Other specified soft tissue disorders: Secondary | ICD-10-CM

## 2021-08-18 DIAGNOSIS — E1140 Type 2 diabetes mellitus with diabetic neuropathy, unspecified: Secondary | ICD-10-CM | POA: Diagnosis not present

## 2021-08-18 DIAGNOSIS — I872 Venous insufficiency (chronic) (peripheral): Secondary | ICD-10-CM | POA: Diagnosis not present

## 2021-08-18 DIAGNOSIS — R600 Localized edema: Secondary | ICD-10-CM | POA: Diagnosis not present

## 2021-08-18 DIAGNOSIS — Z794 Long term (current) use of insulin: Secondary | ICD-10-CM | POA: Diagnosis not present

## 2021-08-18 NOTE — Patient Instructions (Signed)
Please have dr Lisbeth Ply call me at at [phone] to discuss if there is a chance we can increase your diuretics for 3-4 days to help reduce edema in your right leg ?

## 2021-08-18 NOTE — Progress Notes (Signed)
?RFV: hospital follow up for MRSA septic arthritis ? ?Patient ID: Kayla [name], female   DOB: Dec 11, 1940, 81 y.o.   MRN: [id number] ? ?HPI ? ? ?Kayla [name] is an 81 y.o. female with a history of CHF, CKD stage 3b, COPD, chronic pain, CAD, diabetes mellitus on insulin, hypertension, paroxysmal atrial fibrillation. Patient presented secondary to worsening knee pain and erythema and found to have evidence of recurrent septic prepatellar bursitis. Empiric antibiotics initiated and orthopedic surgery consulted.  Patient underwent excisional l and non excisional debridement of right knee prepatellar space including a combined 5-6 inch incision, Hemovac drain was placed by Dr. Alvan Dame on 3/21. Hemovac removed. Had wash out on 3/21 by dr Alvan Dame. OR cx grew MRSA. Was discharged on 6 wk of daptomycin through may 3rd.she reports that her knee is still painful with Lower extremity erythema to right leg, looks like venous stasis. +2 pitting edema. Has sores from bilsters ? ?Outpatient Encounter Medications as of 08/18/2021  ?Medication Sig  ? acetaminophen (TYLENOL) 500 MG tablet Take 1,000 mg by mouth every 6 (six) hours as needed for mild pain, moderate pain or headache.  ? albuterol (PROVENTIL) (2.5 MG/3ML) 0.083% nebulizer solution Take 3 mLs (2.5 mg total) by nebulization every 2 (two) hours as needed for wheezing or shortness of breath. (Patient taking differently: Take 2.5 mg by nebulization in the morning, at noon, and at bedtime.)  ? albuterol (VENTOLIN HFA) 108 (90 Base) MCG/ACT inhaler INHALE 2 PUFFS INTO THE LUNGS EVERY 6 HOURS AS NEEDED FOR WHEEZING OR SHORTNESS OF BREATH  ? Alcohol Swabs (B-D SINGLE USE SWABS REGULAR) PADS   ? allopurinol (ZYLOPRIM) 100 MG tablet Take 100 mg by mouth 2 (two) times daily.  ? amiodarone (PACERONE) 200 MG tablet Take 1 tablet (200 mg total) by mouth daily.  ? amLODipine (NORVASC) 5 MG tablet Take 1 tablet (5 mg total) by mouth daily.  ? apixaban (ELIQUIS) 2.5 MG TABS tablet Take  1 tablet (2.5 mg total) by mouth 2 (two) times daily.  ? ascorbic acid (VITAMIN C) 500 MG tablet Take 500 mg by mouth daily with lunch.  ? Blood Glucose Calibration (TRUE METRIX LEVEL 1) Low SOLN   ? BREZTRI AEROSPHERE 160-9-4.8 MCG/ACT AERO INHALE 2 PUFFS INTO THE LUNGS IN THE MORNING AND AT BEDTIME.  ? Carboxymethylcellulose Sodium (ARTIFICIAL TEARS OP) Place 1 drop into both eyes 3 (three) times daily as needed (for dryness).  ? daptomycin (CUBICIN) IVPB Inject 850 mg into the vein every other day. Indication:  MRSA septic arthritis of  ?First Dose: No ?Last Day of Therapy:  09/03/2021 ?Labs - Once weekly:  CBC/D, BMP, and CPK ?Labs - Every other week:  ESR and CRP ?Method of administration: IV Push ?Method of administration may be changed at the discretion of home infusion pharmacist based upon assessment of the patient and/or caregiver's ability to self-administer the medication ordered.  ? Dextromethorphan-guaiFENesin (TUSSIN DM MAX SUGAR-FREE PO) Take 5 mLs by mouth every 4 (four) hours as needed (for coughing).  ? ferrous sulfate 325 (65 FE) MG tablet Take 1 tablet (325 mg total) by mouth daily with breakfast.  ? fluticasone (FLONASE) 50 MCG/ACT nasal spray USE 2 SPRAYS NASALLY DAILY (Patient taking differently: Place 2 sprays into both nostrils daily.)  ? gabapentin (NEURONTIN) 300 MG capsule Take 1 capsule (300 mg total) by mouth 3 (three) times daily.  ? HYDROcodone-acetaminophen (NORCO) 7.5-325 MG tablet Take 1 tablet by mouth every 6 (six) hours as needed for moderate  pain.  ? insulin aspart (NOVOLOG) 100 UNIT/ML FlexPen Inject 5-15 Units into the skin See admin instructions. Inject 5-15 units into the skin three times a day with meals, per sliding scale (and an additional 1 unit for every 50 points for a BGL >130)  ? insulin glargine (LANTUS) 100 UNIT/ML injection Inject 0.55 mLs (55 Units total) into the skin 2 (two) times daily.  ? levothyroxine (SYNTHROID) 200 MCG tablet Take 200 mcg by mouth daily  before breakfast.  ? metoprolol succinate (TOPROL-XL) 100 MG 24 hr tablet TAKE 1 TABLET EVERY DAY WITH OR IMMEDIATELY AFTER A MEAL (Patient taking differently: Take 100 mg by mouth daily.)  ? naloxone (NARCAN) nasal spray 4 mg/0.1 mL Place 4 mg into the nose once as needed for opioid reversal.  ? nitroGLYCERIN (NITROSTAT) 0.4 MG SL tablet Place 1 tablet (0.4 mg total) under the tongue every 5 (five) minutes as needed for chest pain.  ? ondansetron (ZOFRAN) 4 MG tablet Take 4 mg by mouth every 8 (eight) hours as needed for nausea or vomiting.  ? polyethylene glycol (MIRALAX / GLYCOLAX) 17 g packet Take 17 g by mouth daily as needed for mild constipation.  ? PRESCRIPTION MEDICATION CPAP- At bedtime and as needed during any time of rest (if shortness of breath is present)  ? torsemide (DEMADEX) 20 MG tablet Take 80 mg by mouth daily.  ? TRUE METRIX BLOOD GLUCOSE TEST test strip   ? TRUEplus Lancets 28G MISC   ? vitamin B-12 (CYANOCOBALAMIN) 500 MCG tablet Take 500 mcg by mouth daily.  ? Vitamin D3 (VITAMIN D) 25 MCG tablet Take 1,000 Units by mouth every morning.  ? ?No facility-administered encounter medications on file as of 08/18/2021.  ?  ? ?Patient Active Problem List  ? Diagnosis Date Noted  ? Carotid bruit 07/07/2021  ? Septic prepatellar bursitis of right knee 05/30/2021  ? Cellulitis of right lower extremity 03/19/2021  ? Chronic kidney disease, stage 3b ([place]) 03/19/2021  ? Polymyalgia rheumatica ([place]) 04/15/2020  ? Temporal arteritis ([place]) 04/15/2020  ? Pacemaker - MDT 01/24/2020  ? Orthostatic lightheadedness 01/24/2020  ? Chronic osteomyelitis involving left ankle and foot ([place])   ? Paroxysmal atrial fibrillation ([place]) 10/25/2019  ? Secondary hypercoagulable state ([place]) 10/25/2019  ? Numbness and tingling of right leg 10/19/2019  ? Lumbar radiculopathy 10/19/2019  ? Chronic diastolic heart failure ([place]) 08/13/2019  ? Chest pain 08/13/2019  ? Leukocytosis 08/13/2019  ? Leg swelling 12/09/2018  ? Urinary  frequency 12/09/2018  ? Pain in joint of right hip 01/21/2018  ? Myofascial pain syndrome, cervical 01/06/2018  ? Occipital neuralgia of right side 01/06/2018  ? RUQ abdominal pain   ? Gastritis and gastroduodenitis   ? Bile duct abnormality   ? Cholecystitis 04/30/2016  ? Gall bladder disease 04/28/2016  ? Sinus node dysfunction ([place]) 11/08/2015  ? Ejection fraction   ? Spinal stenosis of cervical region 11/07/2014  ? Preoperative clearance 10/09/2014  ? Carotid artery disease ([place])   ? S/P left TKA 07/27/2011  ? Dizzy spells 05/04/2011  ? Palpitations 01/21/2011  ? RHINITIS 07/21/2010  ? Hyperlipidemia associated with type 2 diabetes mellitus ([place]) 11/24/2008  ? OBESITY 11/24/2008  ? Coronary atherosclerosis 11/24/2008  ? VENTRICULAR HYPERTROPHY, LEFT 11/24/2008  ? HYPOTHYROIDISM, POSTSURGICAL 06/04/2008  ? Headache 06/04/2008  ? Insulin dependent type 2 diabetes mellitus ([place]) 03/23/2007  ? Esophageal reflux 03/23/2007  ? DIVERTICULOSIS OF COLON 03/23/2007  ? GENERALIZED OSTEOARTHROSIS UNSPECIFIED SITE 03/23/2007  ? OBSTRUCTIVE SLEEP APNEA  02/16/2007  ? Hypertension associated with diabetes ([place]) 02/16/2007  ? COPD (chronic obstructive pulmonary disease) ([place]) 02/16/2007  ? ? ? ?Health Maintenance Due  ?Topic Date Due  ? OPHTHALMOLOGY EXAM  Never done  ? URINE MICROALBUMIN  Never done  ? Zoster Vaccines- Shingrix (1 of 2) Never done  ? Pneumonia Vaccine 40+ Years old (2 - PCV) 02/04/2011  ? COLONOSCOPY (Pts 45-76yr Insurance coverage will need to be confirmed)  08/29/2019  ? COVID-19 Vaccine (4 - Booster for Moderna series) 04/30/2020  ? FOOT EXAM  06/15/2020  ?  ? ?Review of Systems ?12 point ros is negative except what is mentioned above ?Physical Exam  ? ?BP (!) 144/76   Pulse 67   Temp 97.6 ?F (36.4 ?C) (Oral)   Resp 16   SpO2 97%   ?Gen = a xo by 3 in nad ?Ext = right knee/leg swelling with blanching erythema. + pitting edema ? ?CBC ?Lab Results  ?Component Value Date  ? WBC 16.4 (H) 07/26/2021  ? RBC  3.40 (L) 07/26/2021  ? HGB 9.9 (L) 07/26/2021  ? HCT 31.6 (L) 07/26/2021  ? PLT 507 (H) 07/26/2021  ? MCV 92.9 07/26/2021  ? MCH 29.1 07/26/2021  ? MCHC 31.3 07/26/2021  ? RDW 15.8 (H) 07/26/2021  ? LYMPHSABS 2.

## 2021-08-18 NOTE — Telephone Encounter (Signed)
Patient aware of appointment for port removal on 09/04/2021 at 12 PM at [facility]-  patient aware she will need to arrive two hours prior to appointment (at 10 AM) due to sedation required. Patient aware npo, can take sips of water and AM medications, will need a driver and 24 hour supervision once home after port removal. Patient verbalized her understanding.  ? ? ?Kayla [name] P Riely Oetken, CMA ? ?

## 2021-08-19 ENCOUNTER — Other Ambulatory Visit: Payer: Self-pay

## 2021-08-19 NOTE — Telephone Encounter (Signed)
Thank you :)

## 2021-08-19 NOTE — Patient Outreach (Signed)
[facility]) Care Management ? ?08/19/2021 ? ?Kayla [name] ?04-02-1941 ?[id number] ? ? ?Telephone call to patient for follow up. Patient reports doing better.  Suture have been removed from knee.  Wound clean, dry, no redness, swelling of drainage.  COPD management continues. No concerns. ? ?Care Plan : RN Care Manager Plan of [name]  ?Updates made by Jon Billings, RN since 08/19/2021 12:00 AM  ?  ? ?Problem: Chronic Disease Management and Care Coordination for COPD   ?  ? ?Long-Range Goal: Development of Plan of Care for COPD   ?Start Date: 03/26/2021  ?Expected End Date: 05/03/2022  ?This Visit's Progress: On track  ?Recent Progress: On track  ?Priority: High  ?Note:   ?Current Barriers:  ?Knowledge Deficits related to plan of care for management of COPD  ?Chronic Disease Management support and education needs related to COPD ? ?RNCM Clinical Goal(s):  ?Patient will verbalize understanding of plan for management of COPD as evidenced by No exacerbation of COPD ?take all medications exactly as prescribed and will call provider for medication related questions as evidenced by patient reports of medication compliance    ?continue to work with Consulting civil engineer and/or Social Worker to address care management and care coordination needs related to COPD as evidenced by adherence to CM Team Scheduled appointments     through collaboration with Consulting civil engineer, provider, and care team.  ? ?Interventions: ?Education provided to patient on COPD ?Inter-disciplinary care team collaboration (see longitudinal plan of care) ?Evaluation of current treatment plan related to  self management and patient's adherence to plan as established by provider ?Encouraged patient to pace self with activity. ?Reviewed importance of medication compliance. ?Reviewed when to call for physician for increased shortness of breath, increased sputum, fatigue that is not his normal, cough, fever or sick contacts.  ? ? ?08/06/21 Patient  continues to need oxygen.  Patient learning to use oxygen despite not wanting to.  Discussed COPD management and when to notify physician early.   ? ?08/19/21 Patient continues with oxygen continuous.  Discussed COPD and COPD progression.   ? ? ?Patient Goals/Self-Care Activities: COPD ?Take all medications as prescribed ?Attend all scheduled provider appointments ?do breathing exercises every day ?follow rescue plan if symptoms flare-up ?eat healthy/prescribed diet: Heart Healthy, diabetic ?Call provider office for new concerns or questions. ?Pace self with activities ? ?Follow Up Plan:  Telephone follow up appointment with care management team member scheduled for:  May ?The patient has been provided with contact information for the care management team and has been advised to call with any health related questions or concerns.  ? ? ?  ? ?Long-Range Goal: Development of POC for Mgmt of right knee infection and pain   ?Start Date: 07/29/2021  ?Expected End Date: 05/19/2022  ?This Visit's Progress: On track  ?Recent Progress: On track  ?Priority: High  ?Note:   ?Current Barriers:  ?Knowledge Deficits related to plan of care for management of right knee infection and chronic pain  ? ?RNCM Clinical Goal(s):  ?Patient will verbalize understanding of plan for management of right knee infection and chronic pain as evidenced by no signs of infection to right knee and reporting acceptable level of pain ?take all medications exactly as prescribed and will call provider for medication related questions as evidenced by med adherence/compliance ?continue to work with RN Care Manager to address care management and care coordination needs related to  right knee infection and chronic pain as evidenced by adherence  to CM Team Scheduled appointments through collaboration with RN Care manager, provider, and care team  ? ?Interventions: ?POC sent to PCP upon initial assessment, quarterly and with any changes in patient's  conditions ?Inter-disciplinary care team collaboration (see longitudinal plan of care) ?Evaluation of current treatment plan related to  self management and patient's adherence to plan as established by provider ? ? ?Pain Interventions:  (Status:  Goal on track:  Yes.) Long Term Goal ?Pain assessment performed ?Medications reviewed ?Reviewed provider established plan for pain management ?Discussed importance of adherence to all scheduled medical appointments ?Counseled on the importance of reporting any/all new or changed pain symptoms or management strategies to pain management provider ?Advised patient to report to care team affect of pain on daily activities ?Reviewed with patient prescribed pharmacological and nonpharmacological pain relief strategies ?Infection Interventions: ?Reviewed signs of infection such as redness, swelling or drainage from site. ?Encouraged patient to keep dressing and wound clean and dry ?Notify physician for any changes.   ? ? ?07/29/21 Patient recently discharged from the hospital for a right knee infection. Patient states that the doctor opened it up and cleaned it out. She now has a central line and antibiotics daily.  She states the nurse came out today to give infusion and taught her husband how to administer it QOD. Patient states she is walking with her walker but has some pain to her knee.  She is taking hydrocodone for pain. She states she has follow up appointments scheduled with her PCP and orthopedic.  She did not give dates but says her husband will be transporting her.    ? ?08/06/21 Patient reports doing good.  She reports that her knee looks good.  She goes to surgeon for sutures to be removed.  No drainage to site.  No concerns.   ? ?08/19/21 Patient reports knee doing good.  Some pain at times.  Sutures have been removed.  No redness, swelling or drainage to knee.  IV antibiotic continue.  No concerns.   ? ? ?Patient Goals/Self-Care Activities: Right knee infection and  pain ?Take all medications as prescribed ?Attend all scheduled provider appointments ?Call provider office for new concerns or questions  ?Take antibiotics as prescribed ?Monitor for swelling, redness, pain, pus, and fever ?Keep wound clean and dry.   ?Notify physician immediately for any changes  ? ? ?Follow Up Plan:  Telephone follow up appointment with care management team member scheduled for:  May ?The patient has been provided with contact information for the care management team and has been advised to call with any health related questions or concerns.   ?  ? ?Plan: Follow-up: Patient agrees to Care Plan and Follow-up. ?Follow-up in May.   ? ?[name], RN, MSN ?[facility] Care Management ?Care Management Coordinator ?Direct Line [phone] ?Toll Free: [phone]  ?Fax: [phone] ? ?

## 2021-08-19 NOTE — Patient Instructions (Addendum)
Patient Goals/Self-Care Activities: COPD ?Take all medications as prescribed ?Attend all scheduled provider appointments ?do breathing exercises every day ?follow rescue plan if symptoms flare-up ?eat healthy/prescribed diet: Heart Healthy, diabetic ?Call provider office for new concerns or questions. ?Pace self with activities ? ? ?Patient Goals/Self-Care Activities: Right knee infection and pain ?Take all medications as prescribed ?Attend all scheduled provider appointments ?Call provider office for new concerns or questions  ?Take antibiotics as prescribed ?Monitor for swelling, redness, pain, pus, and fever ?Keep wound clean and dry.   ?Notify physician immediately for any changes  ? ?

## 2021-08-20 DIAGNOSIS — M71161 Other infective bursitis, right knee: Secondary | ICD-10-CM | POA: Diagnosis not present

## 2021-08-20 LAB — FUNGUS CULTURE RESULT

## 2021-08-20 LAB — FUNGUS CULTURE WITH STAIN

## 2021-08-20 LAB — FUNGAL ORGANISM REFLEX

## 2021-08-25 DIAGNOSIS — M7041 Prepatellar bursitis, right knee: Secondary | ICD-10-CM | POA: Diagnosis not present

## 2021-08-25 DIAGNOSIS — Z1389 Encounter for screening for other disorder: Secondary | ICD-10-CM | POA: Diagnosis not present

## 2021-08-25 DIAGNOSIS — M8580 Other specified disorders of bone density and structure, unspecified site: Secondary | ICD-10-CM | POA: Diagnosis not present

## 2021-08-25 DIAGNOSIS — M353 Polymyalgia rheumatica: Secondary | ICD-10-CM | POA: Diagnosis not present

## 2021-08-25 DIAGNOSIS — M549 Dorsalgia, unspecified: Secondary | ICD-10-CM | POA: Diagnosis not present

## 2021-08-25 DIAGNOSIS — E349 Endocrine disorder, unspecified: Secondary | ICD-10-CM | POA: Diagnosis not present

## 2021-08-25 DIAGNOSIS — G894 Chronic pain syndrome: Secondary | ICD-10-CM | POA: Diagnosis not present

## 2021-08-25 DIAGNOSIS — Z79891 Long term (current) use of opiate analgesic: Secondary | ICD-10-CM | POA: Diagnosis not present

## 2021-08-25 DIAGNOSIS — M1710 Unilateral primary osteoarthritis, unspecified knee: Secondary | ICD-10-CM | POA: Diagnosis not present

## 2021-08-28 DIAGNOSIS — M71161 Other infective bursitis, right knee: Secondary | ICD-10-CM | POA: Diagnosis not present

## 2021-08-29 DIAGNOSIS — I129 Hypertensive chronic kidney disease with stage 1 through stage 4 chronic kidney disease, or unspecified chronic kidney disease: Secondary | ICD-10-CM | POA: Diagnosis not present

## 2021-09-01 DIAGNOSIS — L03115 Cellulitis of right lower limb: Secondary | ICD-10-CM | POA: Diagnosis not present

## 2021-09-01 DIAGNOSIS — L02415 Cutaneous abscess of right lower limb: Secondary | ICD-10-CM | POA: Diagnosis not present

## 2021-09-01 DIAGNOSIS — I2510 Atherosclerotic heart disease of native coronary artery without angina pectoris: Secondary | ICD-10-CM | POA: Diagnosis not present

## 2021-09-01 NOTE — Progress Notes (Signed)
?Cardiology Office Note:   ? ?Date:  09/02/2021  ? ?ID:  Kayla [name], DOB 04/21/1941, MRN [id number] ? ?PCP:  Jerline Pain, MD ?  ?[facility] Providers ?Cardiologist:  Candee Furbish, MD ?Electrophysiologist:  Virl Axe, MD  ?   ? ?Referring MD: [name], MD  ? ? ?History of Present Illness:   ? ?Kayla [name] is a 81 y.o. female here for follow-up of tremors for the past few days at the report of her husband.  ? ?She was admitted to the hospital 07/21/2021 for recurrent septic prepatellar bursitis of right knee. Empiric antibiotics were initiated and orthopedic surgery consulted.  Patient underwent excisional l and non excisional debridement of right knee prepatellar space including a combined 5-6 inch incision. Hemovac drain was placed by Dr. Alvan Dame on 3/21, removed 3/23. ID was consulted, recommended 6 weeks of IV antibiotics. Wound culture was positive for MRSA. Discharged 07/27/21 on 6 weeks of Daptomycin through 5/3. Her losartan was held due to AKI. ? ?Previously here for follow-up of chronic diastolic heart failure, [employer] pacemaker moderate CAD, Dr. Caryl Comes follows in EP, paroxysmal atrial fibrillation on chronic amiodarone, previously on dofetilide, MRI is pacemaker compatible.  Has had bouts of bursitis, cellulitis has been in the hospital last in November 2022.  Had many complaints including shortness of breath fatigue.  Echo EF 65%.  No bleeding episodes. ? ?Frustrated by her chronic right lower extremity cellulitis.  She has had 3 rounds of antibiotics she states.  Dr. Alvan Dame has been helping as well. ? ?Outside office lab work reviewed.  LDL 35 A1c 8.5 creatinine 1.8 ALT 16 TSH 0.08 hemoglobin 11.2.  She is on amiodarone. ? ?At her last appointment she was doing fairly well. Utilizing walker. ? ?Today: ?She is accompanied by a family member. She believes her right leg is doing better lately, but still appears erythematous. Also she continues to suffer from significant right knee pain.   ? ?Currently she is on oxygen. While sitting still and not exerting herself, her breathing is fine. However, she becomes short of breath with minimal exertion including walking around the house. She has noticed her saturation drops to 85% while walking. ? ?She also complains of congestion and cough today. At night she sometimes begins coughing and is unable to stop for a time. ? ?She denies any palpitations, or chest pain. No lightheadedness, headaches, syncope, orthopnea, or PND. ? ? ?Past Medical History:  ?Diagnosis Date  ? Allergy   ? Anxiety   ? Arthritis   ? CAD (coronary artery disease)   ? Mild per remote cath in 2006, /    Nuclear, January, 2013, no significant abnormality,  ? Carotid artery disease ([place])   ? Doppler, January, 2013, 0 63% RIC A., 87-56% LICA, stable  ? Cataract   ? CHF (congestive heart failure) ([place])   ? Chronic kidney disease   ? Complication of anesthesia   ? wakes up "mean"  ? COPD 02/16/2007  ? Intolerant of Spiriva, tudorza Intolerant of Trelegy   ? COPD (chronic obstructive pulmonary disease) ([place])   ? Coronary atherosclerosis 11/24/2008  ? Catheterization 2006, nonobstructive coronary disease   //   nuclear 2013, low risk    ? Diabetes mellitus, type 2 ([place])   ? Diverticula of colon   ? DIVERTICULOSIS OF COLON 03/23/2007  ? Qualifier: Diagnosis of  By: Halford Chessman MD, Vineet    ? Dizziness   ? occasional  ? Dizzy spells 05/04/2011  ?  DM (diabetes mellitus) ([place]) 03/23/2007  ? Qualifier: Diagnosis of  By: Halford Chessman MD, Vineet    ? Dyspnea   ? On exertion  ? Ejection fraction   ? Essential hypertension 02/16/2007  ? Qualifier: Diagnosis of  By: Rosana Hoes CMA, Tammy    ? G E R D 03/23/2007  ? Qualifier: Diagnosis of  By: Halford Chessman MD, Vineet    ? Gall bladder disease 04/28/2016  ? Gastritis and gastroduodenitis   ? GERD (gastroesophageal reflux disease)   ? Goiter   ? hx of  ? Headache(784.0)   ? migraine  ? Hyperlipidemia   ? Hypertension   ? Hypothyroidism   ? HYPOTHYROIDISM, POSTSURGICAL 06/04/2008  ?  Qualifier: Diagnosis of  By: Loanne Drilling MD, Jacelyn Pi   ? Left ventricular hypertrophy   ? Leg edema   ? occasional swelling  ? Leg swelling 12/09/2018  ? Myofascial pain syndrome, cervical 01/06/2018  ? Obesity   ? OBESITY 11/24/2008  ? Qualifier: Diagnosis of  By: Sidney Ace    ? OBSTRUCTIVE SLEEP APNEA 02/16/2007  ? Auto CPAP 09/03/13 to 10/02/13 >> used on 30 of 30 nights with average 6 hrs and 3 min.  Average AHI is 3.1 with median CPAP 5 cm H2O and 95 th percentile CPAP 6 cm H2O.    ? Occipital neuralgia of right side 01/06/2018  ? OSA (obstructive sleep apnea)   ? cpap setting of 12  ? Pain in joint of right hip 01/21/2018  ? Palpitations 01/21/2011  ? 48 hour Holter, 2015, scattered PACs and PVCs.   ? Paroxysmal atrial fibrillation (HCC)   ? Pneumonia   ? PONV (postoperative nausea and vomiting)   ? severe after every surgery  ? Pulmonary hypertension, secondary   ? RHINITIS 07/21/2010  ?     ? RUQ abdominal pain   ? S/P left TKA 07/27/2011  ? Sinus node dysfunction ([place]) 11/08/2015  ? Sleep apnea   ? Spinal stenosis of cervical region 11/07/2014  ? Urinary frequency 12/09/2018  ? UTI (urinary tract infection) 08/15/2019  ? per pt report  ? VENTRICULAR HYPERTROPHY, LEFT 11/24/2008  ? Qualifier: Diagnosis of  By: Sidney Ace    ? ? ?Past Surgical History:  ?Procedure Laterality Date  ? ABDOMINAL HYSTERECTOMY    ? with appendectomy and colon polypectomy  ? AMPUTATION TOE Left 12/15/2019  ? Procedure: AMPUTATION  LEFT GREAT TOE;  Surgeon: Evelina Bucy, DPM;  Location: [facility];  Service: Podiatry;  Laterality: Left;  ? ANTERIOR CERVICAL DECOMP/DISCECTOMY FUSION N/A 11/07/2014  ? Procedure: Anterior Cervical diskectomy and fusion Cervical four-five, Cervical five-six, Cervical six-seven;  Surgeon: Kary Kos, MD;  Location: [facility];  Service: Neurosurgery;  Laterality: N/A;  ? APPENDECTOMY    ? ARTERY BIOPSY Right 02/16/2017  ? Procedure: BIOPSY TEMPORAL ARTERY;  Surgeon: Katha Cabal, MD;  Location:  [facility];  Service: Vascular;  Laterality: Right;  ? ARTERY BIOPSY Left 04/16/2020  ? Procedure: BIOPSY TEMPORAL ARTERY;  Surgeon: Katha Cabal, MD;  Location: [facility] OR[facility];  Service: Vascular;  Laterality: Left;  LEFT TEMPLE  ? BACK SURGERY    ? CARDIAC CATHETERIZATION    ? CATARACT EXTRACTION W/ INTRAOCULAR LENS  IMPLANT, BILATERAL Bilateral   ? CHOLECYSTECTOMY N/A 04/29/2016  ? Procedure: LAPAROSCOPIC CHOLECYSTECTOMY WITH INTRAOPERATIVE CHOLANGIOGRAM;  Surgeon: Alphonsa Overall, MD;  Location: [facility];  Service: General;  Laterality: N/A;  ? EP IMPLANTABLE DEVICE N/A 11/08/2015  ? MRI COMPATABLE -- Procedure: Pacemaker Implant;  Surgeon:  Deboraha Sprang, MD;  Location: [facility];  Service: Cardiovascular;  Laterality: N/A;  ? INCISION AND DRAINAGE ABSCESS Right 05/30/2021  ? Procedure: IRRIGATION AND DEBRIDEMENT OF PREPATELLA BURSA;  Surgeon: Paralee Cancel, MD;  Location: [facility];  Service: Orthopedics;  Laterality: Right;  60 ?wants standard knee draping and pulse lavage  ? INCISION AND DRAINAGE ABSCESS Right 07/22/2021  ? Procedure: INCISION AND DRAINAGE ABSCESS RIGHT KNEE;  Surgeon: Paralee Cancel, MD;  Location: [facility];  Service: Orthopedics;  Laterality: Right;  ? INSERT / REPLACE / REMOVE PACEMAKER  2017  ? Dr. Jens Som [facility])  ? IR FLUORO GUIDE CV LINE RIGHT  07/25/2021  ? IR US GUIDE VASC ACCESS RIGHT  07/25/2021  ? JOINT REPLACEMENT    ? LUMBAR DISC SURGERY  1990's  ? x 2  ? THYROIDECTOMY  2011  ? TOTAL KNEE ARTHROPLASTY  07/27/2011  ? Procedure: TOTAL KNEE ARTHROPLASTY;  Surgeon: Mauri Pole, MD;  Location: [facility];  Service: Orthopedics;  Laterality: Left;  ? UPPER ESOPHAGEAL ENDOSCOPIC ULTRASOUND (EUS) N/A 05/14/2016  ? Procedure: UPPER ESOPHAGEAL ENDOSCOPIC ULTRASOUND (EUS);  Surgeon: Milus Banister, MD;  Location: [facility];  Service: Endoscopy;  Laterality: N/A;  radial only-will be done mod if needed  ? ? ?Current Medications: ?Current Meds  ?Medication Sig  ? acetaminophen (TYLENOL) 500 MG  tablet Take 1,000-1,500 mg by mouth every 8 (eight) hours as needed for mild pain, moderate pain or headache.  ? albuterol (PROVENTIL) (2.5 MG/3ML) 0.083% nebulizer solution Take 3 mLs (2.5 mg total) by neb

## 2021-09-02 ENCOUNTER — Other Ambulatory Visit: Payer: Self-pay

## 2021-09-02 ENCOUNTER — Ambulatory Visit: Payer: Medicare HMO | Admitting: Cardiology

## 2021-09-02 ENCOUNTER — Encounter: Payer: Self-pay | Admitting: Cardiology

## 2021-09-02 DIAGNOSIS — I5032 Chronic diastolic (congestive) heart failure: Secondary | ICD-10-CM | POA: Diagnosis not present

## 2021-09-02 DIAGNOSIS — L03115 Cellulitis of right lower limb: Secondary | ICD-10-CM

## 2021-09-02 DIAGNOSIS — R202 Paresthesia of skin: Secondary | ICD-10-CM | POA: Diagnosis not present

## 2021-09-02 DIAGNOSIS — I480 Paroxysmal atrial fibrillation: Secondary | ICD-10-CM

## 2021-09-02 DIAGNOSIS — R200 Anesthesia of skin: Secondary | ICD-10-CM

## 2021-09-02 DIAGNOSIS — I495 Sick sinus syndrome: Secondary | ICD-10-CM | POA: Diagnosis not present

## 2021-09-02 NOTE — Assessment & Plan Note (Signed)
We will check lower extremity arterial ultrasound bilaterally.  Has had difficulty with wound healing especially the right leg, multiple bouts of cellulitis. ?

## 2021-09-02 NOTE — Assessment & Plan Note (Signed)
No changes made in medication management.  We will continue with the torsemide 80 mg once a day.  Excellent.  Creatinine 1.8, stable. ?

## 2021-09-02 NOTE — Patient Outreach (Signed)
[facility]) Care Management ? ?09/02/2021 ? ?JNYA BROSSARD ?1940-10-21 ?[id number] ? ? ?Telephone call to patient for follow up.  No answer.  HIPAA compliant voice message left.   ? ?Plan: RN CM will attempt again within 4 business days. ? ?[name], RN, MSN ?[facility] Care Management ?Care Management Coordinator ?Direct Line [phone] ?Toll Free: [phone]  ?Fax: [phone] ? ?

## 2021-09-02 NOTE — Assessment & Plan Note (Signed)
Has had recurrent infections.  ID notes reviewed.  Has a Port-A-Cath in place is getting is taking out next week.  Has had IV antibiotics as well as debridement by orthopedics.  We will check lower extremity Dopplers to ensure proper arterial blood flow. ?

## 2021-09-02 NOTE — Assessment & Plan Note (Addendum)
Stable.  Doing well.  Pacemaker in place.  Continue with amiodarone 200 mg a day.  Rhythm control.  Continue with close monitoring medications.   ?

## 2021-09-02 NOTE — Patient Instructions (Signed)
Medication Instructions:  ?The current medical regimen is effective;  continue present plan and medications. ? ?*If you need a refill on your cardiac medications before your next appointment, please call your pharmacy* ? ?Testing/Procedures: ?Your physician has requested that you have a lower extremity arterial exercise duplex. During this test, exercise and ultrasound are used to evaluate arterial blood flow in the legs. Allow one hour for this exam. There are no restrictions or special instructions. ? ? ?Follow-Up: ?At [facility], you and your health needs are our priority.  As part of our continuing mission to provide you with exceptional heart care, we have created designated Provider Care Teams.  These Care Teams include your primary Cardiologist (physician) and Advanced Practice Providers (APPs -  Physician Assistants and Nurse Practitioners) who all work together to provide you with the care you need, when you need it. ? ?We recommend signing up for the patient portal called "MyChart".  Sign up information is provided on this After Visit Summary.  MyChart is used to connect with patients for Virtual Visits (Telemedicine).  Patients are able to view lab/test results, encounter notes, upcoming appointments, etc.  Non-urgent messages can be sent to your provider as well.   ?To learn more about what you can do with MyChart, go to [email or web address].   ? ?Your next appointment:   ?1 year(s) ? ?The format for your next appointment:   ?In Person ? ?Provider:   ?Candee Furbish, MD { ? ? ?Important Information About Sugar ? ? ? ? ?  ?

## 2021-09-02 NOTE — Assessment & Plan Note (Signed)
Pacemaker in place.  Dr. Caryl Comes.  Functioning well.  EP notes reviewed ?

## 2021-09-04 ENCOUNTER — Ambulatory Visit (HOSPITAL_COMMUNITY)
Admission: RE | Admit: 2021-09-04 | Discharge: 2021-09-04 | Disposition: A | Discharge status: Home / Self Care | Payer: Medicare HMO | Source: Ambulatory Visit | Attending: Internal Medicine | Admitting: Internal Medicine

## 2021-09-04 DIAGNOSIS — Z452 Encounter for adjustment and management of vascular access device: Secondary | ICD-10-CM | POA: Insufficient documentation

## 2021-09-04 DIAGNOSIS — M71161 Other infective bursitis, right knee: Secondary | ICD-10-CM | POA: Insufficient documentation

## 2021-09-04 HISTORY — PX: IR REMOVAL TUN CV CATH W/O FL: IMG2289

## 2021-09-04 MED ORDER — LIDOCAINE HCL 1 % IJ SOLN
INTRAMUSCULAR | Status: AC
  Filled 2021-09-04: qty 20

## 2021-09-05 ENCOUNTER — Other Ambulatory Visit: Payer: Self-pay

## 2021-09-05 NOTE — Patient Outreach (Signed)
[facility]) Care Management ? ?09/05/2021 ? ?Kayla [name] ?27-Sep-1940 ?[id number] ? ? ?Telephone call to patient for follow up. No answer.  HIPAA compliant voice message left.   ? ?Plan: RN CM will attempt again within 4 business days if no return call.   ?RN CM will send letter.  ?

## 2021-09-09 ENCOUNTER — Other Ambulatory Visit: Payer: Self-pay

## 2021-09-09 ENCOUNTER — Ambulatory Visit (INDEPENDENT_AMBULATORY_CARE_PROVIDER_SITE_OTHER): Payer: Medicare HMO | Admitting: Internal Medicine

## 2021-09-09 ENCOUNTER — Encounter: Payer: Self-pay | Admitting: Internal Medicine

## 2021-09-09 VITALS — BP 165/78 | HR 80 | Temp 97.5°F | Wt 231.0 lb

## 2021-09-09 DIAGNOSIS — M71161 Other infective bursitis, right knee: Secondary | ICD-10-CM

## 2021-09-09 DIAGNOSIS — M7989 Other specified soft tissue disorders: Secondary | ICD-10-CM | POA: Diagnosis not present

## 2021-09-09 NOTE — Progress Notes (Signed)
? ? ?Patient ID: Kayla [name], female   DOB: [birth year], 81 y.o.   MRN: [id number] ? ?HPI ? ?Kayla [name] is an 81 y.o. female with a history of CHF, CKD stage 3b, COPD, chronic pain, CAD, diabetes mellitus on insulin, hypertension, paroxysmal atrial fibrillation. withrecurrent septic prepatellar bursitis. Empiric antibiotics initiated and orthopedic surgery consulted.  Patient underwent excisional l and non excisional debridement of right knee prepatellar space including a combined 5-6 inch incision, Hemovac drain was placed by Dr. Alvan Dame on 3/21. Hemovac removed. Had wash out on 3/21 by dr Alvan Dame. OR cx grew MRSA. Was discharged on 6 wk of daptomycin through may 3rd.she reports that her knee is still painful with Lower extremity erythema to right leg, looks like venous stasis. +2 pitting edema. Has a few open sores from bilsters ? ?Saw dr Alvan Dame yesterday. She is taking some diuretics.  Occ blister opening on her right leg ? ?Outpatient Encounter Medications as of 09/09/2021  ?Medication Sig  ? acetaminophen (TYLENOL) 500 MG tablet Take 1,000-1,500 mg by mouth every 8 (eight) hours as needed for mild pain, moderate pain or headache.  ? albuterol (PROVENTIL) (2.5 MG/3ML) 0.083% nebulizer solution Take 3 mLs (2.5 mg total) by nebulization every 2 (two) hours as needed for wheezing or shortness of breath. (Patient taking differently: Take 2.5 mg by nebulization in the morning, at noon, and at bedtime.)  ? albuterol (VENTOLIN HFA) 108 (90 Base) MCG/ACT inhaler INHALE 2 PUFFS INTO THE LUNGS EVERY 6 HOURS AS NEEDED FOR WHEEZING OR SHORTNESS OF BREATH  ? Alcohol Swabs (B-D SINGLE USE SWABS REGULAR) PADS   ? allopurinol (ZYLOPRIM) 100 MG tablet Take 200 mg by mouth daily.  ? amiodarone (PACERONE) 200 MG tablet Take 1 tablet (200 mg total) by mouth daily.  ? amLODipine (NORVASC) 5 MG tablet Take 1 tablet (5 mg total) by mouth daily.  ? apixaban (ELIQUIS) 2.5 MG TABS tablet Take 1 tablet (2.5 mg total) by mouth 2 (two)  times daily.  ? ascorbic acid (VITAMIN C) 500 MG tablet Take 500 mg by mouth daily with lunch.  ? Blood Glucose Calibration (TRUE METRIX LEVEL 1) Low SOLN   ? BREZTRI AEROSPHERE 160-9-4.8 MCG/ACT AERO INHALE 2 PUFFS INTO THE LUNGS IN THE MORNING AND AT BEDTIME.  ? Carboxymethylcellulose Sodium (ARTIFICIAL TEARS OP) Place 1 drop into both eyes 3 (three) times daily as needed (for dryness).  ? Chlorpheniramine Maleate (ALLERGY RELIEF PO) Take 1 tablet by mouth daily as needed (allergies).  ? Dextromethorphan-guaiFENesin (TUSSIN DM MAX SUGAR-FREE PO) Take 5 mLs by mouth every 4 (four) hours as needed (for coughing).  ? ferrous sulfate 325 (65 FE) MG tablet Take 1 tablet (325 mg total) by mouth daily with breakfast.  ? fluticasone (FLONASE) 50 MCG/ACT nasal spray USE 2 SPRAYS NASALLY DAILY (Patient taking differently: Place 1 spray into both nostrils 2 (two) times daily.)  ? gabapentin (NEURONTIN) 300 MG capsule Take 1 capsule (300 mg total) by mouth 3 (three) times daily.  ? HYDROcodone-acetaminophen (NORCO) 10-325 MG tablet Take 1 tablet by mouth 3 (three) times daily as needed for moderate pain.  ? insulin aspart (NOVOLOG) 100 UNIT/ML FlexPen Inject 10-17 Units into the skin 3 (three) times daily with meals.  ? insulin glargine (LANTUS) 100 UNIT/ML injection Inject 0.55 mLs (55 Units total) into the skin 2 (two) times daily. (Patient taking differently: Inject 50 Units into the skin 2 (two) times daily.)  ? levothyroxine (SYNTHROID) 200 MCG tablet Take 200 mcg by  mouth daily before breakfast.  ? losartan (COZAAR) 50 MG tablet Take 25 mg by mouth daily.  ? metoprolol succinate (TOPROL-XL) 100 MG 24 hr tablet TAKE 1 TABLET EVERY DAY WITH OR IMMEDIATELY AFTER A MEAL  ? naloxone (NARCAN) nasal spray 4 mg/0.1 mL Place 4 mg into the nose once as needed for opioid reversal.  ? nitroGLYCERIN (NITROSTAT) 0.4 MG SL tablet Place 1 tablet (0.4 mg total) under the tongue every 5 (five) minutes as needed for chest pain.  ?  PRESCRIPTION MEDICATION CPAP- At bedtime and as needed during any time of rest (if shortness of breath is present)  ? torsemide (DEMADEX) 20 MG tablet Take 80 mg by mouth daily.  ? TRUE METRIX BLOOD GLUCOSE TEST test strip   ? TRUEplus Lancets 28G MISC   ? vitamin B-12 (CYANOCOBALAMIN) 1000 MCG tablet Take 1,000 mcg by mouth daily.  ? Vitamin D3 (VITAMIN D) 25 MCG tablet Take 1,000 Units by mouth every morning.  ? daptomycin (CUBICIN) IVPB Inject 850 mg into the vein every other day. Indication:  MRSA septic arthritis of  ?First Dose: No ?Last Day of Therapy:  09/03/2021 ?Labs - Once weekly:  CBC/D, BMP, and CPK ?Labs - Every other week:  ESR and CRP ?Method of administration: IV Push ?Method of administration may be changed at the discretion of home infusion pharmacist based upon assessment of the patient and/or caregiver's ability to self-administer the medication ordered. (Patient not taking: Reported on 09/09/2021)  ? ?No facility-administered encounter medications on file as of 09/09/2021.  ?  ? ?Patient Active Problem List  ? Diagnosis Date Noted  ? Carotid bruit 07/07/2021  ? Septic prepatellar bursitis of right knee 05/30/2021  ? Cellulitis of right lower extremity 03/19/2021  ? Chronic kidney disease, stage 3b ([place]) 03/19/2021  ? Polymyalgia rheumatica ([place]) 04/15/2020  ? Temporal arteritis ([place]) 04/15/2020  ? Pacemaker - MDT 01/24/2020  ? Orthostatic lightheadedness 01/24/2020  ? Chronic osteomyelitis involving left ankle and foot ([place])   ? Paroxysmal atrial fibrillation ([place]) 10/25/2019  ? Secondary hypercoagulable state ([place]) 10/25/2019  ? Numbness and tingling of right leg 10/19/2019  ? Lumbar radiculopathy 10/19/2019  ? Chronic diastolic heart failure ([place]) 08/13/2019  ? Chest pain 08/13/2019  ? Leukocytosis 08/13/2019  ? Leg swelling 12/09/2018  ? Urinary frequency 12/09/2018  ? Pain in joint of right hip 01/21/2018  ? Myofascial pain syndrome, cervical 01/06/2018  ? Occipital neuralgia of right side  01/06/2018  ? RUQ abdominal pain   ? Gastritis and gastroduodenitis   ? Bile duct abnormality   ? Cholecystitis 04/30/2016  ? Gall bladder disease 04/28/2016  ? Sinus node dysfunction ([place]) 11/08/2015  ? Ejection fraction   ? Spinal stenosis of cervical region 11/07/2014  ? Preoperative clearance 10/09/2014  ? Carotid artery disease ([place])   ? S/P left TKA 07/27/2011  ? Dizzy spells 05/04/2011  ? Palpitations 01/21/2011  ? RHINITIS 07/21/2010  ? Hyperlipidemia associated with type 2 diabetes mellitus ([place]) 11/24/2008  ? OBESITY 11/24/2008  ? Coronary atherosclerosis 11/24/2008  ? VENTRICULAR HYPERTROPHY, LEFT 11/24/2008  ? HYPOTHYROIDISM, POSTSURGICAL 06/04/2008  ? Headache 06/04/2008  ? Insulin dependent type 2 diabetes mellitus ([place]) 03/23/2007  ? Esophageal reflux 03/23/2007  ? DIVERTICULOSIS OF COLON 03/23/2007  ? GENERALIZED OSTEOARTHROSIS UNSPECIFIED SITE 03/23/2007  ? OBSTRUCTIVE SLEEP APNEA 02/16/2007  ? Hypertension associated with diabetes ([place]) 02/16/2007  ? COPD (chronic obstructive pulmonary disease) ([place]) 02/16/2007  ? ? ? ?Health Maintenance Due  ?Topic Date Due  ?  OPHTHALMOLOGY EXAM  Never done  ? Zoster Vaccines- Shingrix (1 of 2) Never done  ? Pneumonia Vaccine 67+ Years old (2 - PCV) 02/04/2011  ? COLONOSCOPY (Pts 45-11yr Insurance coverage will need to be confirmed)  08/29/2019  ? COVID-19 Vaccine (4 - Booster for Moderna series) 04/30/2020  ? FOOT EXAM  06/15/2020  ?  ? ?Review of Systems ?12 point ros is negative except for pitting edema. Limited range of motion to affected knee ?Physical Exam  ? ?BP (!) 165/78   Pulse 80   Temp (!) 97.5 ?F (36.4 ?C) (Oral)   Wt 231 lb (104.8 kg)   BMI 38.44 kg/m?   ?Gen = a x o by 3 in nad ?Ext = incision of knee is well healed. No fluctuance no erythema. Distal pitting edema with mild discoloration, blanching. asymmetric ? ?CBC ?Lab Results  ?Component Value Date  ? WBC 16.4 (H) 07/26/2021  ? RBC 3.40 (L) 07/26/2021  ? HGB 9.9 (L) 07/26/2021  ? HCT 31.6  (L) 07/26/2021  ? PLT 507 (H) 07/26/2021  ? MCV 92.9 07/26/2021  ? MCH 29.1 07/26/2021  ? MCHC 31.3 07/26/2021  ? RDW 15.8 (H) 07/26/2021  ? LYMPHSABS 2.3 07/21/2021  ? MONOABS 2.1 (H) 07/21/2021  ? EOSABS 0.1 03/20/202

## 2021-09-09 NOTE — Patient Outreach (Signed)
[facility]) Care Management ? ?09/09/2021 ? ?BELLARAE LIZER ?Mar 15, 1941 ?[id number] ? ? ?Telephone call to patient. No answer.  HIPAA compliant voice message left.  ? ?Plan: RN CM will attempt again later this month.   ? ?[name], RN, MSN ?[facility] Care Management ?Care Management Coordinator ?Direct Line [phone] ?Toll Free: [phone]  ?Fax: [phone] ? ?

## 2021-09-15 ENCOUNTER — Inpatient Hospital Stay (HOSPITAL_COMMUNITY): Admission: RE | Admit: 2021-09-15 | Payer: Medicare HMO | Source: Ambulatory Visit

## 2021-09-26 ENCOUNTER — Ambulatory Visit: Payer: Self-pay

## 2021-09-26 NOTE — Patient Outreach (Signed)
[facility]) Care Management  09/26/2021  Kayla [name] 1940-08-28 [id number]   Telephone call to patient for follow up. No answer.  HIPAA compliant voice message left.    Plan: RN CM will attempt again in the month of June.    [name], RN, MSN [facility] Care Management Care Management Coordinator Direct Line [phone] Toll Free: [phone]  Fax: [phone]

## 2021-09-30 ENCOUNTER — Other Ambulatory Visit: Payer: Self-pay

## 2021-09-30 ENCOUNTER — Ambulatory Visit: Payer: Self-pay

## 2021-09-30 DIAGNOSIS — E113213 Type 2 diabetes mellitus with mild nonproliferative diabetic retinopathy with macular edema, bilateral: Secondary | ICD-10-CM | POA: Diagnosis not present

## 2021-09-30 DIAGNOSIS — H353132 Nonexudative age-related macular degeneration, bilateral, intermediate dry stage: Secondary | ICD-10-CM | POA: Diagnosis not present

## 2021-09-30 DIAGNOSIS — H35033 Hypertensive retinopathy, bilateral: Secondary | ICD-10-CM | POA: Diagnosis not present

## 2021-09-30 DIAGNOSIS — H26493 Other secondary cataract, bilateral: Secondary | ICD-10-CM | POA: Diagnosis not present

## 2021-09-30 NOTE — Patient Instructions (Signed)
Patient Goals/Self-Care Activities: COPD Take all medications as prescribed Attend all scheduled provider appointments do breathing exercises every day follow rescue plan if symptoms flare-up eat healthy/prescribed diet: Heart Healthy, diabetic Call provider office for new concerns or questions. Pace self with activities 

## 2021-09-30 NOTE — Patient Outreach (Signed)
[facility]) Care Management Telephonic RN Care Manager Note   09/30/2021 Name:  Kayla [name] MRN:  [id number] DOB:  February 12, 1941  Summary: Telephone call to patient. She reports doing ok.  She reports that her knee is better. Antibiotic finished and line removed.  Some pain at times but to be expected.  Patient continues with COPD management with oxygen dependency.  Spouse helps and family assists as needed.  No concerns.   Subjective: Kayla [name] is an 81 y.o. year old female who is a primary patient of Skains, Thana Farr, MD. The care management team was consulted for assistance with care management and/or care coordination needs.    Telephonic RN Care Manager completed Telephone Visit today.  Objective:   Medications Reviewed Today     Reviewed by Jon Billings, RN (Case Manager) on 09/30/21 at 1134  Med List Status: <None>   Medication Order Taking? Sig Documenting Provider Last Dose Status Informant  acetaminophen (TYLENOL) 500 MG tablet [id number] Yes Take 1,000-1,500 mg by mouth every 8 (eight) hours as needed for mild pain, moderate pain or headache. [provider] Taking Active Self  albuterol (PROVENTIL) (2.5 MG/3ML) 0.083% nebulizer solution [id number] Yes Take 3 mLs (2.5 mg total) by nebulization every 2 (two) hours as needed for wheezing or shortness of breath.  Patient taking differently: Take 2.5 mg by nebulization in the morning, at noon, and at bedtime.   Chesley Mires, MD Taking Active Self  albuterol (VENTOLIN HFA) 108 (90 Base) MCG/ACT inhaler [id number] Yes INHALE 2 PUFFS INTO THE LUNGS EVERY 6 HOURS AS NEEDED FOR WHEEZING OR SHORTNESS OF Hulan Amato, MD Taking Active Self  Alcohol Swabs (B-D SINGLE USE SWABS REGULAR) PADS [id number] Yes  [provider] Taking Active Self  allopurinol (ZYLOPRIM) 100 MG tablet [id number] Yes Take 200 mg by mouth daily. [provider] Taking Active Self  amiodarone (PACERONE)  200 MG tablet [id number] Yes Take 1 tablet (200 mg total) by mouth daily. Jerline Pain, MD Taking Active Self  amLODipine (NORVASC) 5 MG tablet [id number] Yes Take 1 tablet (5 mg total) by mouth daily. Jerline Pain, MD Taking Active Self  apixaban (ELIQUIS) 2.5 MG TABS tablet [id number] Yes Take 1 tablet (2.5 mg total) by mouth 2 (two) times daily. Jerline Pain, MD Taking Active Self  ascorbic acid (VITAMIN C) 500 MG tablet [id number] Yes Take 500 mg by mouth daily with lunch. [provider] Taking Active Self  Blood Glucose Calibration (TRUE METRIX LEVEL 1) Low SOLN [id number] Yes  [provider] Taking Active Self  BREZTRI AEROSPHERE 160-9-4.8 MCG/ACT AERO [id number] Yes INHALE 2 PUFFS INTO THE LUNGS IN THE MORNING AND AT BEDTIME. Chesley Mires, MD Taking Active Self  Carboxymethylcellulose Sodium (ARTIFICIAL TEARS OP) [id number] Yes Place 1 drop into both eyes 3 (three) times daily as needed (for dryness). [provider] Taking Active Self  Chlorpheniramine Maleate (ALLERGY RELIEF PO) [id number] Yes Take 1 tablet by mouth daily as needed (allergies). [provider] Taking Active Self  daptomycin (CUBICIN) IVPB [id number]  Inject 850 mg into the vein every other day. Indication:  MRSA septic arthritis of  First Dose: No Last Day of Therapy:  09/03/2021 Labs - Once weekly:  CBC/D, BMP, and CPK Labs - Every other week:  ESR and CRP Method of administration: IV Push Method of administration may be changed at the discretion of home infusion pharmacist based upon assessment of the patient and/or caregiver's ability to self-administer  the medication ordered.  Patient not taking: Reported on 09/09/2021   Elmarie Shiley, MD  Active Self  Dextromethorphan-guaiFENesin (TUSSIN DM MAX SUGAR-FREE PO) [id number] No Take 5 mLs by mouth every 4 (four) hours as needed (for coughing).  Patient not taking: Reported on 09/30/2021   [provider] Not Taking Active Self   ferrous sulfate 325 (65 FE) MG tablet [id number] Yes Take 1 tablet (325 mg total) by mouth daily with breakfast. Deboraha Sprang, MD Taking Active Self  fluticasone (FLONASE) 50 MCG/ACT nasal spray [id number] Yes USE 2 SPRAYS NASALLY DAILY  Patient taking differently: Place 1 spray into both nostrils 2 (two) times daily.   Collene Gobble, MD Taking Active Self  gabapentin (NEURONTIN) 300 MG capsule [id number] Yes Take 1 capsule (300 mg total) by mouth 3 (three) times daily. Regalado, Cassie Freer, MD Taking Active Self  HYDROcodone-acetaminophen (NORCO) 10-325 MG tablet [id number] Yes Take 1 tablet by mouth 3 (three) times daily as needed for moderate pain. [provider] Taking Active Self  insulin aspart (NOVOLOG) 100 UNIT/ML FlexPen [id number] Yes Inject 10-17 Units into the skin 3 (three) times daily with meals. [provider] Taking Active Self           Med Note Broadus John, REGEENA   Tue Mar 18, 2021  9:33 PM)    insulin glargine (LANTUS) 100 UNIT/ML injection [id number] Yes Inject 0.55 mLs (55 Units total) into the skin 2 (two) times daily.  Patient taking differently: Inject 50 Units into the skin 2 (two) times daily.   Regalado, Cassie Freer, MD Taking Active Self  levothyroxine (SYNTHROID) 200 MCG tablet [id number] Yes Take 200 mcg by mouth daily before breakfast. [provider] Taking Active Self  losartan (COZAAR) 50 MG tablet [id number] Yes Take 25 mg by mouth daily. [provider] Taking Active Self  metoprolol succinate (TOPROL-XL) 100 MG 24 hr tablet [id number] Yes TAKE 1 TABLET EVERY DAY WITH OR IMMEDIATELY AFTER A MEAL Deboraha Sprang, MD Taking Active Self  naloxone [facility]) nasal spray 4 mg/0.1 mL [id number] Yes Place 4 mg into the nose once as needed for opioid reversal. [provider] Taking Active Self           Med Note Patsey Berthold, Cephus Shelling   Mon Jul 07, 2021  7:46 AM)    nitroGLYCERIN (NITROSTAT) 0.4 MG SL tablet [id number] Yes Place 1 tablet  (0.4 mg total) under the tongue every 5 (five) minutes as needed for chest pain. Jerline Pain, MD Taking Active Self           Med Note Joellyn Haff May 29, 2021  3:38 PM)    PRESCRIPTION MEDICATION [id number] Yes CPAP- At bedtime and as needed during any time of rest (if shortness of breath is present) [provider] Taking Active Self  torsemide (DEMADEX) 20 MG tablet [id number] Yes Take 80 mg by mouth daily. [provider] Taking Active Self  TRUE METRIX BLOOD GLUCOSE TEST test strip [id number] Yes  [provider] Taking Active Self  TRUEplus Lancets 28G [place] [id number] Yes  [provider] Taking Active Self  vitamin B-12 (CYANOCOBALAMIN) 1000 MCG tablet [id number] Yes Take 1,000 mcg by mouth daily. [provider] Taking Active Self  Vitamin D3 (VITAMIN D) 25 MCG tablet [id number] Yes Take 1,000 Units by mouth every morning. [provider] Taking Active Self  Med List Note Jeannette How 05/29/21 1532): Cpap  SDOH:  (Social Determinants of Health) assessments and interventions performed:  SDOH Interventions    Flowsheet Row Most Recent Value  SDOH Interventions   Physical Activity Interventions Intervention Not Indicated  Stress Interventions Intervention Not Indicated        Care Plan  Review of patient past medical history, allergies, medications, health status, including review of consultants reports, laboratory and other test data, was performed as part of comprehensive evaluation for care management services.   Care Plan : RN Care Manager Plan of [place]  Updates made by Jon Billings, RN since 09/30/2021 12:00 AM     Problem: Chronic Disease Management and Care Coordination for COPD      Long-Range Goal: Development of Plan of Care for COPD   Start Date: 03/26/2021  Expected End Date: 05/03/2022  This Visit's Progress: On track  Recent Progress: On track  Priority: High  Note:    Current Barriers:  Knowledge Deficits related to plan of care for management of COPD  Chronic Disease Management support and education needs related to COPD  RNCM Clinical Goal(s):  Patient will verbalize understanding of plan for management of COPD as evidenced by No exacerbation of COPD take all medications exactly as prescribed and will call provider for medication related questions as evidenced by patient reports of medication compliance    continue to work with RN Care Manager and/or Social Worker to address care management and care coordination needs related to COPD as evidenced by adherence to CM Team Scheduled appointments     through collaboration with RN Care manager, provider, and care team.   Interventions: Education provided to patient on COPD Inter-disciplinary care team collaboration (see longitudinal plan of care) Evaluation of current treatment plan related to  self management and patient's adherence to plan as established by provider Encouraged patient to pace self with activity. Reviewed importance of medication compliance. Reviewed when to call for physician for increased shortness of breath, increased sputum, fatigue that is not his normal, cough, fever or sick contacts.    08/06/21 Patient continues to need oxygen.  Patient learning to use oxygen despite not wanting to.  Discussed COPD management and when to notify physician early.    08/19/21 Patient continues with oxygen continuous.  Discussed COPD and COPD progression.    09/30/21 Patient reports continued oxygen use.  Discussed COPD and action plan.  Discussed notifying physician for any changes.    Patient Goals/Self-Care Activities: COPD Take all medications as prescribed Attend all scheduled provider appointments do breathing exercises every day follow rescue plan if symptoms flare-up eat healthy/prescribed diet: Heart Healthy, diabetic Call provider office for new concerns or questions. Pace self with  activities  Follow Up Plan:  Telephone follow up appointment with care management team member scheduled for:  June The patient has been provided with contact information for the care management team and has been advised to call with any health related questions or concerns.       Long-Range Goal: Development of POC for Mgmt of right knee infection and pain Completed 09/30/2021  Start Date: 07/29/2021  Expected End Date: 05/19/2022  This Visit's Progress: On track  Recent Progress: On track  Priority: High  Note:   Current Barriers:  Knowledge Deficits related to plan of care for management of right knee infection and chronic pain   RNCM Clinical Goal(s):  Patient will verbalize understanding of plan for management of right knee infection and chronic pain as evidenced by no signs of infection to right  knee and reporting acceptable level of pain take all medications exactly as prescribed and will call provider for medication related questions as evidenced by med adherence/compliance continue to work with RN Care Manager to address care management and care coordination needs related to  right knee infection and chronic pain as evidenced by adherence to CM Team Scheduled appointments through collaboration with RN Care manager, provider, and care team   Interventions: POC sent to PCP upon initial assessment, quarterly and with any changes in patient's conditions Inter-disciplinary care team collaboration (see longitudinal plan of care) Evaluation of current treatment plan related to  self management and patient's adherence to plan as established by provider   Pain Interventions:  (Status:  Goal Met.) Long Term Goal Pain assessment performed Medications reviewed Reviewed provider established plan for pain management Discussed importance of adherence to all scheduled medical appointments Counseled on the importance of reporting any/all new or changed pain symptoms or management strategies to  pain management provider Advised patient to report to care team affect of pain on daily activities Reviewed with patient prescribed pharmacological and nonpharmacological pain relief strategies Infection Interventions: Reviewed signs of infection such as redness, swelling or drainage from site. Encouraged patient to keep dressing and wound clean and dry Notify physician for any changes.     07/29/21 Patient recently discharged from the hospital for a right knee infection. Patient states that the doctor opened it up and cleaned it out. She now has a central line and antibiotics daily.  She states the nurse came out today to give infusion and taught her husband how to administer it QOD. Patient states she is walking with her walker but has some pain to her knee.  She is taking hydrocodone for pain. She states she has follow up appointments scheduled with her PCP and orthopedic.  She did not give dates but says her husband will be transporting her.     08/06/21 Patient reports doing good.  She reports that her knee looks good.  She goes to surgeon for sutures to be removed.  No drainage to site.  No concerns.    08/19/21 Patient reports knee doing good.  Some pain at times.  Sutures have been removed.  No redness, swelling or drainage to knee.  IV antibiotic continue.  No concerns.    09/30/21 Patient reports her knee has pain at times but no redness, swelling or drainage.  IV line has been removed.  Discussed when to see physician if knee worsens.     Patient Goals/Self-Care Activities: Right knee infection and pain Take all medications as prescribed Attend all scheduled provider appointments Call provider office for new concerns or questions  Take antibiotics as prescribed Monitor for swelling, redness, pain, pus, and fever Keep wound clean and dry.   Notify physician immediately for any changes    Follow Up Plan:  Telephone follow up appointment with care management team member scheduled for:   May The patient has been provided with contact information for the care management team and has been advised to call with any health related questions or concerns.        Plan:  Telephone follow up appointment with care management team member scheduled for:  June The patient has been provided with contact information for the care management team and has been advised to call with any health related questions or concerns.  Follow-up: Patient agrees to Care Plan and Follow-up.  [name], RN, MSN [facility] Care Management Care Management Coordinator  Direct Line [phone] Toll Free: [phone]  Fax: [phone]

## 2021-10-02 ENCOUNTER — Ambulatory Visit: Payer: Self-pay

## 2021-10-02 DIAGNOSIS — Z1389 Encounter for screening for other disorder: Secondary | ICD-10-CM | POA: Diagnosis not present

## 2021-10-02 DIAGNOSIS — M25569 Pain in unspecified knee: Secondary | ICD-10-CM | POA: Diagnosis not present

## 2021-10-02 DIAGNOSIS — Z79891 Long term (current) use of opiate analgesic: Secondary | ICD-10-CM | POA: Diagnosis not present

## 2021-10-02 DIAGNOSIS — Z79899 Other long term (current) drug therapy: Secondary | ICD-10-CM | POA: Diagnosis not present

## 2021-10-02 DIAGNOSIS — E349 Endocrine disorder, unspecified: Secondary | ICD-10-CM | POA: Diagnosis not present

## 2021-10-02 DIAGNOSIS — M353 Polymyalgia rheumatica: Secondary | ICD-10-CM | POA: Diagnosis not present

## 2021-10-02 DIAGNOSIS — G894 Chronic pain syndrome: Secondary | ICD-10-CM | POA: Diagnosis not present

## 2021-10-02 DIAGNOSIS — M8580 Other specified disorders of bone density and structure, unspecified site: Secondary | ICD-10-CM | POA: Diagnosis not present

## 2021-10-02 DIAGNOSIS — M1710 Unilateral primary osteoarthritis, unspecified knee: Secondary | ICD-10-CM | POA: Diagnosis not present

## 2021-10-02 DIAGNOSIS — M549 Dorsalgia, unspecified: Secondary | ICD-10-CM | POA: Diagnosis not present

## 2021-10-07 ENCOUNTER — Ambulatory Visit: Payer: Medicare HMO

## 2021-10-07 ENCOUNTER — Ambulatory Visit (INDEPENDENT_AMBULATORY_CARE_PROVIDER_SITE_OTHER): Payer: Medicare HMO | Admitting: Podiatry

## 2021-10-07 ENCOUNTER — Encounter: Payer: Self-pay | Admitting: Podiatry

## 2021-10-07 DIAGNOSIS — M79674 Pain in right toe(s): Secondary | ICD-10-CM | POA: Diagnosis not present

## 2021-10-07 DIAGNOSIS — I495 Sick sinus syndrome: Secondary | ICD-10-CM

## 2021-10-07 DIAGNOSIS — M79675 Pain in left toe(s): Secondary | ICD-10-CM | POA: Diagnosis not present

## 2021-10-07 DIAGNOSIS — B351 Tinea unguium: Secondary | ICD-10-CM

## 2021-10-07 DIAGNOSIS — E1142 Type 2 diabetes mellitus with diabetic polyneuropathy: Secondary | ICD-10-CM | POA: Diagnosis not present

## 2021-10-07 DIAGNOSIS — I872 Venous insufficiency (chronic) (peripheral): Secondary | ICD-10-CM

## 2021-10-07 LAB — CUP PACEART REMOTE DEVICE CHECK
Battery Remaining Longevity: 50 mo
Battery Voltage: 2.98 V
Brady Statistic AP VP Percent: 4.32 %
Brady Statistic AP VS Percent: 94.31 %
Brady Statistic AS VP Percent: 0.08 %
Brady Statistic AS VS Percent: 1.29 %
Brady Statistic RA Percent Paced: 98.49 %
Brady Statistic RV Percent Paced: 4.67 %
Date Time Interrogation Session: 20230606092903
Implantable Lead Implant Date: 20170707
Implantable Lead Implant Date: 20170707
Implantable Lead Location: 753859
Implantable Lead Location: 753860
Implantable Lead Model: 3830
Implantable Lead Model: 5076
Implantable Pulse Generator Implant Date: 20170707
Lead Channel Impedance Value: 266 Ohm
Lead Channel Impedance Value: 361 Ohm
Lead Channel Impedance Value: 380 Ohm
Lead Channel Impedance Value: 380 Ohm
Lead Channel Pacing Threshold Amplitude: 0.875 V
Lead Channel Pacing Threshold Amplitude: 1.875 V
Lead Channel Pacing Threshold Pulse Width: 0.4 ms
Lead Channel Pacing Threshold Pulse Width: 0.4 ms
Lead Channel Sensing Intrinsic Amplitude: 3.125 mV
Lead Channel Sensing Intrinsic Amplitude: 3.125 mV
Lead Channel Sensing Intrinsic Amplitude: 5.75 mV
Lead Channel Sensing Intrinsic Amplitude: 5.75 mV
Lead Channel Setting Pacing Amplitude: 1.75 V
Lead Channel Setting Pacing Amplitude: 2.5 V
Lead Channel Setting Pacing Pulse Width: 1 ms
Lead Channel Setting Sensing Sensitivity: 2.8 mV

## 2021-10-07 NOTE — Progress Notes (Signed)
Subjective:  Patient ID: Kayla [name], female    DOB: Aug 13, 1940,   MRN: [id number]  Chief Complaint  Patient presents with   Nail Problem    Bilateral nail trim    Callouses    81 y.o. female presents for concern of nail care and nail trimming.  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 She is diabetic and last A1c was 8.4 .  Also concerned for swelling and cellulitis in right leg.  . Denies any other pedal complaints. Denies n/v/f/c.   PCP: Candee Furbish MD   Past Medical History:  Diagnosis Date   Allergy    Anxiety    Arthritis    CAD (coronary artery disease)    Mild per remote cath in 2006, /    Nuclear, January, 2013, no significant abnormality,   Carotid artery disease ([place])    Doppler, January, 2013, 0 60% RIC A., 73-71% LICA, stable   Cataract    CHF (congestive heart failure) ([place])    Chronic kidney disease    Complication of anesthesia    wakes up "mean"   COPD 02/16/2007   Intolerant of Spiriva, tudorza Intolerant of Trelegy    COPD (chronic obstructive pulmonary disease) ([place])    Coronary atherosclerosis 11/24/2008   Catheterization 2006, nonobstructive coronary disease   //   nuclear 2013, low risk     Diabetes mellitus, type 2 ([place])    Diverticula of colon    DIVERTICULOSIS OF COLON 03/23/2007   Qualifier: Diagnosis of  By: Halford Chessman MD, Vineet     Dizziness    occasional   Dizzy spells 05/04/2011   DM (diabetes mellitus) ([place]) 03/23/2007   Qualifier: Diagnosis of  By: Halford Chessman MD, Vineet     Dyspnea    On exertion   Ejection fraction    Essential hypertension 02/16/2007   Qualifier: Diagnosis of  By: Rosana Hoes CMA, Tammy     G E R D 03/23/2007   Qualifier: Diagnosis of  By: Halford Chessman MD, Vineet     Gall bladder disease 04/28/2016   Gastritis and gastroduodenitis    GERD (gastroesophageal reflux disease)    Goiter    hx of   Headache(784.0)    migraine   Hyperlipidemia    Hypertension    Hypothyroidism     HYPOTHYROIDISM, POSTSURGICAL 06/04/2008   Qualifier: Diagnosis of  By: Loanne Drilling MD, Sean A    Left ventricular hypertrophy    Leg edema    occasional swelling   Leg swelling 12/09/2018   Myofascial pain syndrome, cervical 01/06/2018   Obesity    OBESITY 11/24/2008   Qualifier: Diagnosis of  By: Sidney Ace     OBSTRUCTIVE SLEEP APNEA 02/16/2007   Auto CPAP 09/03/13 to 10/02/13 >> used on 30 of 30 nights with average 6 hrs and 3 min.  Average AHI is 3.1 with median CPAP 5 cm H2O and 95 th percentile CPAP 6 cm H2O.     Occipital neuralgia of right side 01/06/2018   OSA (obstructive sleep apnea)    cpap setting of 12   Pain in joint of right hip 01/21/2018   Palpitations 01/21/2011   48 hour Holter, 2015, scattered PACs and PVCs.    Paroxysmal atrial fibrillation (HCC)    Pneumonia    PONV (postoperative nausea and vomiting)    severe after every surgery   Pulmonary hypertension, secondary    RHINITIS 07/21/2010        RUQ  abdominal pain    S/P left TKA 07/27/2011   Sinus node dysfunction ([place]) 11/08/2015   Sleep apnea    Spinal stenosis of cervical region 11/07/2014   Urinary frequency 12/09/2018   UTI (urinary tract infection) 08/15/2019   per pt report   VENTRICULAR HYPERTROPHY, LEFT 11/24/2008   Qualifier: Diagnosis of  By: Sidney Ace      Objective:  Physical Exam: Vascular: DP/PT pulses 2/4 bilateral. CFT <3 seconds. Normal hair growth on digits. Edema and hemosidering deposit noted to bilateral lower extremities worse on right. Some small areas of blistering and openings.  Skin. No lacerations or abrasions bilateral feet. Right hallux nail proximal nail plat with hemosiderin deposit underlying nail. No tenderness to palpation. Nails 1-5 on right and 2-5 on right thickened elongated with subungual debris.  Musculoskeletal: MMT 5/5 bilateral lower extremities in DF, PF, Inversion and Eversion. Deceased ROM in DF of ankle joint.  Neurological: Sensation intact to light  touch.   Assessment:   1. Diabetic peripheral neuropathy associated with type 2 diabetes mellitus (HCC)   2. Pain due to onychomycosis of toenail   3. Venous (peripheral) insufficiency       Plan:  Patient was evaluated and treated and all questions answered. -Discussed and educated patient on diabetic foot care, especially with  regards to the vascular, neurological and musculoskeletal systems.  -Stressed the importance of good glycemic control and the detriment of not  controlling glucose levels in relation to the foot. -Discussed supportive shoes at all times and checking feet regularly.  -Mechanically debrided all nails 1-5 bilateral using sterile nail nipper and filed with dremel without incident  -Discussed compression stocking for leg swelling.  -Answered all patient questions -Patient to return  in 3 months for at risk foot care -Patient advised to call the office if any problems or questions arise in the meantime.   Lorenda Peck, DPM

## 2021-10-08 ENCOUNTER — Telehealth: Payer: Self-pay | Admitting: Adult Health

## 2021-10-08 NOTE — Telephone Encounter (Signed)
Patient is not wanting the CT scan that was ordered for July.   Please advise Tammy.

## 2021-10-09 NOTE — Telephone Encounter (Signed)
CT has been cancelled. Attempted to call pt but unable to reach. Left pt a detailed message letting her know that the CT was cancelled. Nothing further needed.

## 2021-10-09 NOTE — Telephone Encounter (Signed)
Okay please cancel  We can discuss in detail at follow up  Prev Ct showed 1-2 mm nodules, very small. Can hold off on CT chest for now .

## 2021-10-21 DIAGNOSIS — E1140 Type 2 diabetes mellitus with diabetic neuropathy, unspecified: Secondary | ICD-10-CM | POA: Diagnosis not present

## 2021-10-21 DIAGNOSIS — N2581 Secondary hyperparathyroidism of renal origin: Secondary | ICD-10-CM | POA: Diagnosis not present

## 2021-10-21 DIAGNOSIS — E790 Hyperuricemia without signs of inflammatory arthritis and tophaceous disease: Secondary | ICD-10-CM | POA: Diagnosis not present

## 2021-10-21 DIAGNOSIS — L97919 Non-pressure chronic ulcer of unspecified part of right lower leg with unspecified severity: Secondary | ICD-10-CM | POA: Diagnosis not present

## 2021-10-21 DIAGNOSIS — I83019 Varicose veins of right lower extremity with ulcer of unspecified site: Secondary | ICD-10-CM | POA: Diagnosis not present

## 2021-10-21 DIAGNOSIS — E782 Mixed hyperlipidemia: Secondary | ICD-10-CM | POA: Diagnosis not present

## 2021-10-21 DIAGNOSIS — I10 Essential (primary) hypertension: Secondary | ICD-10-CM | POA: Diagnosis not present

## 2021-10-21 DIAGNOSIS — I480 Paroxysmal atrial fibrillation: Secondary | ICD-10-CM | POA: Diagnosis not present

## 2021-10-21 DIAGNOSIS — N184 Chronic kidney disease, stage 4 (severe): Secondary | ICD-10-CM | POA: Diagnosis not present

## 2021-10-21 DIAGNOSIS — I495 Sick sinus syndrome: Secondary | ICD-10-CM | POA: Diagnosis not present

## 2021-10-21 DIAGNOSIS — I5030 Unspecified diastolic (congestive) heart failure: Secondary | ICD-10-CM | POA: Diagnosis not present

## 2021-10-22 NOTE — Progress Notes (Signed)
Remote pacemaker transmission.   

## 2021-10-28 DIAGNOSIS — Z872 Personal history of diseases of the skin and subcutaneous tissue: Secondary | ICD-10-CM | POA: Diagnosis not present

## 2021-10-28 DIAGNOSIS — J441 Chronic obstructive pulmonary disease with (acute) exacerbation: Secondary | ICD-10-CM | POA: Diagnosis not present

## 2021-10-29 ENCOUNTER — Telehealth: Payer: Self-pay

## 2021-10-29 ENCOUNTER — Other Ambulatory Visit: Payer: Self-pay

## 2021-10-29 NOTE — Telephone Encounter (Signed)
I called and spoke with patient. I reviewed the options with her. Applying for asstiance (she doesn't think she will qualify), using [facility] select for ~$85/month or switching to warfarin which requires more monitoring. Patient wishes to stay on Eliquis. She asked me to call pharmacy but I advised her that pharmacy isn't going to send out until she confirms she is ok with cost.

## 2021-10-29 NOTE — Telephone Encounter (Signed)
[place] calling stating that the pt states that her medication Eliquis if too expensive and would like an alternative sent to the pharmacy, like warfarin, because of the cost and a short supply sent to local pharmacy [facility] in [place]. Please address

## 2021-10-29 NOTE — Patient Instructions (Signed)
Patient Goals/Self-Care Activities: COPD Take all medications as prescribed Attend all scheduled provider appointments do breathing exercises every day follow rescue plan if symptoms flare-up eat healthy/prescribed diet: Heart Healthy, diabetic Call provider office for new concerns or questions. Pace self with activities

## 2021-10-29 NOTE — Patient Outreach (Signed)
[facility]) Care Management  10/29/2021  Kayla [name] 04-30-1941 [id number]   Telephone call to patient. She reports she is dealing with an infection and saw the physician on yesterday.  Discussed COPD and when to notify physician.  She verbalized understanding. No concerns.    Care Plan : RN Care Manager Plan of [place]  Updates made by Jon Billings, RN since 10/29/2021 12:00 AM     Problem: Chronic Disease Management and Care Coordination for COPD      Long-Range Goal: Development of Plan of Care for COPD   Start Date: 03/26/2021  Expected End Date: 05/03/2022  This Visit's Progress: On track  Recent Progress: On track  Priority: High  Note:   Current Barriers:  Knowledge Deficits related to plan of care for management of COPD  Chronic Disease Management support and education needs related to COPD  RNCM Clinical Goal(s):  Patient will verbalize understanding of plan for management of COPD as evidenced by No exacerbation of COPD take all medications exactly as prescribed and will call provider for medication related questions as evidenced by patient reports of medication compliance    continue to work with RN Care Manager and/or Social Worker to address care management and care coordination needs related to COPD as evidenced by adherence to CM Team Scheduled appointments     through collaboration with RN Care manager, provider, and care team.   Interventions: Education provided to patient on COPD Inter-disciplinary care team collaboration (see longitudinal plan of care) Evaluation of current treatment plan related to  self management and patient's adherence to plan as established by provider Encouraged patient to pace self with activity. Reviewed importance of medication compliance. Reviewed when to call for physician for increased shortness of breath, increased sputum, fatigue that is not his normal, cough, fever or sick contacts.    08/06/21 Patient  continues to need oxygen.  Patient learning to use oxygen despite not wanting to.  Discussed COPD management and when to notify physician early.    08/19/21 Patient continues with oxygen continuous.  Discussed COPD and COPD progression.    09/30/21 Patient reports continued oxygen use.  Discussed COPD and action plan.  Discussed notifying physician for any changes.    10/29/21 Patient reports she has a respiratory infection and that she saw physician on yesterday.  She was ordered prednisone and antibiotic.  Discussed importance of taking medication and also discussed COPD and when to notify physician or go to the emergency room.  No concerns.   Patient Goals/Self-Care Activities: COPD Take all medications as prescribed Attend all scheduled provider appointments do breathing exercises every day follow rescue plan if symptoms flare-up eat healthy/prescribed diet: Heart Healthy, diabetic Call provider office for new concerns or questions. Pace self with activities  Follow Up Plan:  Telephone follow up appointment with care management team member scheduled for:  July The patient has been provided with contact information for the care management team and has been advised to call with any health related questions or concerns.       Plan: Follow-up: Patient agrees to Care Plan and Follow-up. Follow-up in July.  [name], RN, MSN [facility] Care Management Care Management Coordinator Direct Line [phone] Toll Free: [phone]  Fax: [phone]

## 2021-10-31 ENCOUNTER — Other Ambulatory Visit: Payer: Self-pay

## 2021-10-31 DIAGNOSIS — Z79891 Long term (current) use of opiate analgesic: Secondary | ICD-10-CM | POA: Diagnosis not present

## 2021-10-31 DIAGNOSIS — G894 Chronic pain syndrome: Secondary | ICD-10-CM | POA: Diagnosis not present

## 2021-10-31 DIAGNOSIS — M549 Dorsalgia, unspecified: Secondary | ICD-10-CM | POA: Diagnosis not present

## 2021-10-31 DIAGNOSIS — M1710 Unilateral primary osteoarthritis, unspecified knee: Secondary | ICD-10-CM | POA: Diagnosis not present

## 2021-10-31 DIAGNOSIS — M8580 Other specified disorders of bone density and structure, unspecified site: Secondary | ICD-10-CM | POA: Diagnosis not present

## 2021-10-31 DIAGNOSIS — Z1389 Encounter for screening for other disorder: Secondary | ICD-10-CM | POA: Diagnosis not present

## 2021-10-31 DIAGNOSIS — M353 Polymyalgia rheumatica: Secondary | ICD-10-CM | POA: Diagnosis not present

## 2021-10-31 DIAGNOSIS — M25569 Pain in unspecified knee: Secondary | ICD-10-CM | POA: Diagnosis not present

## 2021-10-31 DIAGNOSIS — E349 Endocrine disorder, unspecified: Secondary | ICD-10-CM | POA: Diagnosis not present

## 2021-10-31 MED ORDER — APIXABAN 2.5 MG PO TABS: 2.5000 mg | ORAL_TABLET | Freq: Two times a day (BID) | ORAL | 1 refills | Status: DC

## 2021-10-31 NOTE — Telephone Encounter (Signed)
Prescription refill request for Eliquis received. Indication:Afib Last office visit:5/23 Scr:1.8 Age: 81 Weight:104.8 kg  Prescription refilled

## 2021-11-05 ENCOUNTER — Other Ambulatory Visit: Payer: Self-pay | Admitting: Internal Medicine

## 2021-11-10 ENCOUNTER — Telehealth: Payer: Self-pay | Admitting: Cardiology

## 2021-11-10 DIAGNOSIS — I480 Paroxysmal atrial fibrillation: Secondary | ICD-10-CM

## 2021-11-10 MED ORDER — APIXABAN 2.5 MG PO TABS: 2.5000 mg | ORAL_TABLET | Freq: Two times a day (BID) | ORAL | 1 refills | Status: DC

## 2021-11-10 NOTE — Telephone Encounter (Signed)
*  STAT* If patient is at the pharmacy, call can be transferred to refill team.   1. Which medications need to be refilled? (please list name of each medication and dose if known) apixaban (ELIQUIS) 2.5 MG TABS tablet  2. Which pharmacy/location (including street and city if local pharmacy) is medication to be sent to? [id number] [place] 3. Do they need a 30 day or 90 day supply? 2 weeks supply  Patient is completely out of medication and needs an emergency short term supply sent to a local pharmacy.

## 2021-11-10 NOTE — Telephone Encounter (Signed)
Prescription refill request for Eliquis received. Indication:  Afib  Last office visit:09/02/21 ([place])  Scr: 1.83 (07/26/21)  Age: 81 Weight: 104.8kg  Appropriate dose and refill sent to requested pharmacy.

## 2021-11-11 ENCOUNTER — Other Ambulatory Visit: Payer: Self-pay

## 2021-11-11 NOTE — Patient Outreach (Signed)
[place] [facility]) Care Management  11/11/2021  KATELAN HIRT 05-28-1940 [id number]   Telephone call to patient for disease management follow up.   No answer.  HIPAA compliant voice message left.    Plan: If no return call, RN CM will attempt patient again in august.  Myli Pae J Felicitas Sine, RN, MSN [place] Management Care Management Coordinator Direct Line [phone] Toll Free: [phone]  Fax: [phone]

## 2021-11-17 ENCOUNTER — Other Ambulatory Visit (HOSPITAL_COMMUNITY): Payer: Medicare HMO

## 2021-12-05 DIAGNOSIS — Z6837 Body mass index (BMI) 37.0-37.9, adult: Secondary | ICD-10-CM | POA: Diagnosis not present

## 2021-12-05 DIAGNOSIS — J449 Chronic obstructive pulmonary disease, unspecified: Secondary | ICD-10-CM | POA: Diagnosis not present

## 2021-12-05 DIAGNOSIS — R059 Cough, unspecified: Secondary | ICD-10-CM | POA: Diagnosis not present

## 2021-12-05 DIAGNOSIS — M25551 Pain in right hip: Secondary | ICD-10-CM | POA: Diagnosis not present

## 2021-12-09 DIAGNOSIS — M549 Dorsalgia, unspecified: Secondary | ICD-10-CM | POA: Diagnosis not present

## 2021-12-09 DIAGNOSIS — M25569 Pain in unspecified knee: Secondary | ICD-10-CM | POA: Diagnosis not present

## 2021-12-09 DIAGNOSIS — G894 Chronic pain syndrome: Secondary | ICD-10-CM | POA: Diagnosis not present

## 2021-12-09 DIAGNOSIS — M353 Polymyalgia rheumatica: Secondary | ICD-10-CM | POA: Diagnosis not present

## 2021-12-09 DIAGNOSIS — Z1389 Encounter for screening for other disorder: Secondary | ICD-10-CM | POA: Diagnosis not present

## 2021-12-09 DIAGNOSIS — M8580 Other specified disorders of bone density and structure, unspecified site: Secondary | ICD-10-CM | POA: Diagnosis not present

## 2021-12-09 DIAGNOSIS — Z79891 Long term (current) use of opiate analgesic: Secondary | ICD-10-CM | POA: Diagnosis not present

## 2021-12-09 DIAGNOSIS — M1710 Unilateral primary osteoarthritis, unspecified knee: Secondary | ICD-10-CM | POA: Diagnosis not present

## 2021-12-09 DIAGNOSIS — E349 Endocrine disorder, unspecified: Secondary | ICD-10-CM | POA: Diagnosis not present

## 2021-12-12 DIAGNOSIS — R6889 Other general symptoms and signs: Secondary | ICD-10-CM | POA: Diagnosis not present

## 2021-12-12 DIAGNOSIS — J441 Chronic obstructive pulmonary disease with (acute) exacerbation: Secondary | ICD-10-CM | POA: Diagnosis not present

## 2021-12-15 ENCOUNTER — Other Ambulatory Visit: Payer: Self-pay

## 2021-12-15 NOTE — Patient Outreach (Signed)
[place] [facility]) Care Management  12/15/2021  Kayla [name] 03-31-1941 [id number]   Telephone call to patient for follow up. No answer.  HIPAA compliant voice message left.  Plan: RN CM will attempt again in the month of August.   [name] J Eilam [place], RN, MSN [place] Management Care Management Coordinator Direct Line [phone] Toll Free: [phone]  Fax: [phone]

## 2021-12-18 ENCOUNTER — Other Ambulatory Visit: Payer: Self-pay | Admitting: Cardiology

## 2021-12-19 ENCOUNTER — Other Ambulatory Visit: Payer: Self-pay

## 2021-12-19 NOTE — Patient Outreach (Signed)
[name]) Care Management  12/19/2021  LEANOR VORIS [birth year] [id number]   Patient states she is not doing well. She stares her doctor wanted her to go o the ED but she does not want to go.  Patient wheezing on the phone. She states she is also having shortness of breath and that her legs are weeping.  CM urged patient to go to the ED but states she will think about it.  Advised that CM would follow up next week. She verbalized understanding.    Care Plan : RN Care Manager Plan of [place]  Updates made by Jon Billings, RN since 12/19/2021 12:00 AM     Problem: Chronic Disease Management and Care Coordination for COPD      Long-Range Goal: Development of Plan of Care for COPD   Start Date: 03/26/2021  Expected End Date: 05/03/2022  This Visit's Progress: Not on track  Recent Progress: On track  Priority: High  Note:   Current Barriers:  Knowledge Deficits related to plan of care for management of COPD  Chronic Disease Management support and education needs related to COPD  RNCM Clinical Goal(s):  Patient will verbalize understanding of plan for management of COPD as evidenced by No exacerbation of COPD take all medications exactly as prescribed and will call provider for medication related questions as evidenced by patient reports of medication compliance    continue to work with RN Care Manager and/or Social Worker to address care management and care coordination needs related to COPD as evidenced by adherence to CM Team Scheduled appointments     through collaboration with RN Care manager, provider, and care team.   Interventions: Education provided to patient on COPD Inter-disciplinary care team collaboration (see longitudinal plan of care) Evaluation of current treatment plan related to  self management and patient's adherence to plan as established by provider Encouraged patient to pace self with activity. Reviewed importance of medication  compliance. Reviewed when to call for physician for increased shortness of breath, increased sputum, fatigue that is not his normal, cough, fever or sick contacts.    08/06/21 Patient continues to need oxygen.  Patient learning to use oxygen despite not wanting to.  Discussed COPD management and when to notify physician early.    08/19/21 Patient continues with oxygen continuous.  Discussed COPD and COPD progression.    09/30/21 Patient reports continued oxygen use.  Discussed COPD and action plan.  Discussed notifying physician for any changes.    10/29/21 Patient reports she has a respiratory infection and that she saw physician on yesterday.  She was ordered prednisone and antibiotic.  Discussed importance of taking medication and also discussed COPD and when to notify physician or go to the emergency room.  No concerns.   12/19/21 Patient reports that her breathing is not good. She states she is on prednisone and antibiotic but still no better.  She states that the doctor recommended that she go to the ED but she refused. Advised patient that she probably needs to go as she is wheezing on the phone. She states she will think about it.  Discussed importance of COPD management.    Patient Goals/Self-Care Activities: COPD Take all medications as prescribed Attend all scheduled provider appointments do breathing exercises every day follow rescue plan if symptoms flare-up eat healthy/prescribed diet: Heart Healthy, diabetic Call provider office for new concerns or questions. Pace self with activities  Follow Up Plan:  Telephone follow up appointment with care management team  member scheduled for:  Again in august The patient has been provided with contact information for the care management team and has been advised to call with any health related questions or concerns.       Plan: Follow-up: Patient agrees to Care Plan and Follow-up. Follow-up next week.   [name], RN, MSN [facility] Care  Management Care Management Coordinator Direct Line [phone] Toll Free: [phone]  Fax: [phone]

## 2021-12-22 DIAGNOSIS — Z6837 Body mass index (BMI) 37.0-37.9, adult: Secondary | ICD-10-CM | POA: Diagnosis not present

## 2021-12-22 DIAGNOSIS — E1121 Type 2 diabetes mellitus with diabetic nephropathy: Secondary | ICD-10-CM | POA: Diagnosis not present

## 2021-12-22 DIAGNOSIS — J441 Chronic obstructive pulmonary disease with (acute) exacerbation: Secondary | ICD-10-CM | POA: Diagnosis not present

## 2021-12-23 DIAGNOSIS — E113213 Type 2 diabetes mellitus with mild nonproliferative diabetic retinopathy with macular edema, bilateral: Secondary | ICD-10-CM | POA: Diagnosis not present

## 2021-12-23 DIAGNOSIS — H43813 Vitreous degeneration, bilateral: Secondary | ICD-10-CM | POA: Diagnosis not present

## 2021-12-23 DIAGNOSIS — H353132 Nonexudative age-related macular degeneration, bilateral, intermediate dry stage: Secondary | ICD-10-CM | POA: Diagnosis not present

## 2021-12-24 ENCOUNTER — Other Ambulatory Visit: Payer: Self-pay

## 2021-12-24 NOTE — Patient Outreach (Signed)
[place] [facility]) Care Management  12/24/2021  Kayla [name] 09/07/1940 [id number]   Telephone call to patient for follow up. Patient reports she is better. Saw PCP yesterday.  COPD management continues.    Care Plan : RN Care Manager Plan of [place]  Updates made by Jon Billings, RN since 12/24/2021 12:00 AM     Problem: Chronic Disease Management and Care Coordination for COPD      Long-Range Goal: Development of Plan of Care for COPD   Start Date: 03/26/2021  Expected End Date: 05/03/2022  This Visit's Progress: On track  Recent Progress: Not on track  Priority: High  Note:   Current Barriers:  Knowledge Deficits related to plan of care for management of COPD  Chronic Disease Management support and education needs related to COPD  RNCM Clinical Goal(s):  Patient will verbalize understanding of plan for management of COPD as evidenced by No exacerbation of COPD take all medications exactly as prescribed and will call provider for medication related questions as evidenced by patient reports of medication compliance    continue to work with RN Care Manager and/or Social Worker to address care management and care coordination needs related to COPD as evidenced by adherence to CM Team Scheduled appointments     through collaboration with RN Care manager, provider, and care team.   Interventions: Education provided to patient on COPD Inter-disciplinary care team collaboration (see longitudinal plan of care) Evaluation of current treatment plan related to  self management and patient's adherence to plan as established by provider Encouraged patient to pace self with activity. Reviewed importance of medication compliance. Reviewed when to call for physician for increased shortness of breath, increased sputum, fatigue that is not his normal, cough, fever or sick contacts.    08/06/21 Patient continues to need oxygen.  Patient learning to use oxygen despite not wanting  to.  Discussed COPD management and when to notify physician early.    08/19/21 Patient continues with oxygen continuous.  Discussed COPD and COPD progression.    09/30/21 Patient reports continued oxygen use.  Discussed COPD and action plan.  Discussed notifying physician for any changes.    10/29/21 Patient reports she has a respiratory infection and that she saw physician on yesterday.  She was ordered prednisone and antibiotic.  Discussed importance of taking medication and also discussed COPD and when to notify physician or go to the emergency room.  No concerns.   12/19/21 Patient reports that her breathing is not good. She states she is on prednisone and antibiotic but still no better.  She states that the doctor recommended that she go to the ED but she refused. Advised patient that she probably needs to go as she is wheezing on the phone. She states she will think about it.  Discussed importance of COPD management.    12/24/21 Patient reprots she feels some better.  She saw PCP yesterday and was given prednisone until she sees the pulmonologist.  Discussed COPD management and advised if things get worse to call doctor or go to the ER. She verbalized understanding.   Patient Goals/Self-Care Activities: COPD Take all medications as prescribed Attend all scheduled provider appointments do breathing exercises every day follow rescue plan if symptoms flare-up eat healthy/prescribed diet: Heart Healthy, diabetic Call provider office for new concerns or questions. Pace self with activities  Follow Up Plan:  Patient to be followed by outside CM.       Plan: Patient will be followed by  outside CM program.  [name], RN, MSN [place] Management Care Management Coordinator Direct Line [phone] Toll Free: [phone]  Fax: [phone]

## 2022-01-01 ENCOUNTER — Other Ambulatory Visit: Payer: Self-pay

## 2022-01-01 NOTE — Patient Outreach (Signed)
[facility] [facility]) Care Management  01/01/2022  Kayla [name] Jul 12, 1940 [id number]   Closing patient case:Case transitioned to [facility] Care Management team   [name], RN, MSN [place] Management Care Management Coordinator Direct Line [phone] Toll Free: [phone]  Fax: [phone]

## 2022-01-06 DIAGNOSIS — Z79891 Long term (current) use of opiate analgesic: Secondary | ICD-10-CM | POA: Diagnosis not present

## 2022-01-06 DIAGNOSIS — M1710 Unilateral primary osteoarthritis, unspecified knee: Secondary | ICD-10-CM | POA: Diagnosis not present

## 2022-01-06 DIAGNOSIS — Z1389 Encounter for screening for other disorder: Secondary | ICD-10-CM | POA: Diagnosis not present

## 2022-01-06 DIAGNOSIS — G894 Chronic pain syndrome: Secondary | ICD-10-CM | POA: Diagnosis not present

## 2022-01-06 DIAGNOSIS — M353 Polymyalgia rheumatica: Secondary | ICD-10-CM | POA: Diagnosis not present

## 2022-01-06 DIAGNOSIS — E349 Endocrine disorder, unspecified: Secondary | ICD-10-CM | POA: Diagnosis not present

## 2022-01-06 DIAGNOSIS — M25569 Pain in unspecified knee: Secondary | ICD-10-CM | POA: Diagnosis not present

## 2022-01-06 DIAGNOSIS — M8580 Other specified disorders of bone density and structure, unspecified site: Secondary | ICD-10-CM | POA: Diagnosis not present

## 2022-01-06 DIAGNOSIS — M549 Dorsalgia, unspecified: Secondary | ICD-10-CM | POA: Diagnosis not present

## 2022-01-12 ENCOUNTER — Ambulatory Visit (INDEPENDENT_AMBULATORY_CARE_PROVIDER_SITE_OTHER): Payer: Medicare HMO | Admitting: Pulmonary Disease

## 2022-01-12 ENCOUNTER — Ambulatory Visit: Payer: Medicare HMO | Admitting: Podiatry

## 2022-01-12 ENCOUNTER — Encounter: Payer: Self-pay | Admitting: Pulmonary Disease

## 2022-01-12 VITALS — BP 132/62 | HR 81 | Wt 248.0 lb

## 2022-01-12 DIAGNOSIS — J449 Chronic obstructive pulmonary disease, unspecified: Secondary | ICD-10-CM

## 2022-01-12 DIAGNOSIS — Z9989 Dependence on other enabling machines and devices: Secondary | ICD-10-CM

## 2022-01-12 DIAGNOSIS — G4733 Obstructive sleep apnea (adult) (pediatric): Secondary | ICD-10-CM

## 2022-01-12 DIAGNOSIS — J9611 Chronic respiratory failure with hypoxia: Secondary | ICD-10-CM

## 2022-01-12 MED ORDER — GUAIFENESIN ER 600 MG PO TB12: 1200.0000 mg | ORAL_TABLET | Freq: Two times a day (BID) | ORAL | Status: DC | PRN

## 2022-01-12 NOTE — Progress Notes (Signed)
[facility]  Chief Complaint  Patient presents with   Follow-up    Cpap compliance    Past Surgical History:  She  has a past surgical history that includes Thyroidectomy (2011); Total knee arthroplasty (07/27/2011); Abdominal hysterectomy; Anterior cervical decomp/discectomy fusion (N/A, 11/07/2014); Cardiac catheterization (N/A, 11/08/2015); Lumbar disc surgery (1990's); Cholecystectomy (N/A, 04/29/2016); Upper esophageal endoscopic ultrasound (eus) (N/A, 05/14/2016); Cataract extraction w/ intraocular lens  implant, bilateral (Bilateral); Insert / replace / remove pacemaker (2017); Cardiac catheterization; Back surgery; Appendectomy; Joint replacement; Artery Biopsy (Right, 02/16/2017); Amputation toe (Left, 12/15/2019); Artery Biopsy (Left, 04/16/2020); Incision and drainage abscess (Right, 05/30/2021); Incision and drainage abscess (Right, 07/22/2021); IR US Guide Vasc Access Right (07/25/2021); IR Fluoro Guide CV Line Right (07/25/2021); and IR Removal Tun Cv Cath W/O FL (09/04/2021).  Past Medical History:  Hypothyroidism s/p thyroidectomy, HTN, HLD, HA, GERD, Diverticulosis, DM, Cataract, CAD, OA, Anxiety, Allergies  Constitutional:  BP 132/62 (BP Location: Left Arm, Cuff Size: Large)   Pulse 81   Wt 248 lb (112.5 kg)   SpO2 95% Comment: 3L pulse  BMI 41.27 kg/m   Brief Summary:  Kayla [name] is a 81 y.o. female former smoker with COPD and obstructive sleep apnea.      Subjective:   She was in hospital in March for septic arthritis.  She was started on supplemental oxygen after this.    She has completed course of ABx.  Using 3 liters oxygen with exertion and at night with CPAP.  No issues with CPAP set up.   She continues to have swelling in her legs, and gets fluid weeping from her legs.  She was started on prednisone again after  visit with her PCP.  She was having wheezing and dyspnea.  Breathing better when on prednisone.  She has  more chest congestion and cough with clear sputum.  She uses breztri twice per day, and using her nebulizer helps.  Physical Exam:   Appearance - well kempt, wearing oxygen  ENMT - no sinus tenderness, no oral exudate, no LAN, Mallampati 3 airway, no stridor  Respiratory - scattered rhonchi Rt > Lt that clear with coughing, no wheeze  CV - s1s2 regular rate and rhythm, no murmurs  Ext - 2+ edema  Skin - no rashes  Psych - normal mood and affect      Pulmonary testing:  Spirometry 10/19/08 >> FEV1 1.55 (68%), FEV1% 70  Spirometry 08/29/12 >> FEV1 1.38 (59%), FEV1% 58 FeNO 03/09/16 >> 7  Chest Imaging:  HRCT chest 11/16/20 >> atherosclerosis, scattered areas of scarring, several tiny nodules up to 2 mm  Sleep Tests:  PSG 02/2005 >> AHI 10  CPAP titration 02/19/14 >> CPAP 11 cm H2O >> AHI 0.7, PLMI 81.4, did not need supplemental oxygen Auto CPAP 12/13/21 to 01/11/22 >> used on 29 of 30 nights with average 6 hrs 35 min.  Average AHI 2 with median CPAP 7 and 95 th percentile CPAP 11 cm H2O  Cardiac Tests:  Echo 10/03/20 >> EF 60 to 65%, severe LVH, grade 2 DD  Social History:  She  reports that she quit smoking about 24 years ago. Her smoking use included cigarettes. She started smoking about 57 years ago. She has a 66.00 pack-year smoking history. She has never used smokeless tobacco. She reports that she does not drink alcohol and does not use drugs.  Family History:  Her family history includes Diabetes in her maternal aunt and maternal uncle;  Heart Problems in her father and mother; Heart defect in her mother; Heart disease in her brother; Lung cancer in her daughter; Throat cancer in her brother.     Assessment/Plan:   COPD with chronic bronchitis. - intolerant of trelegy, and incruse  - spiriva caused worsening cough - continue breztri with spacer device - will have her try mucinex/albuterol nebulizer/flutter valve bid for now, and then prn once she is doing better -  she is to complete prednisone from her PCP - if she continues to have difficulty, then might need to try daliresp again but start at low dose qod and increase as tolerated  Obstructive sleep apnea. - she is compliant with CPAP and reports benefit from CPAP - she uses [employer] for her DME - current CPAP ordered February 2020 - continue auto CPAP 5 to 15 cm H2O  Chronic respiratory failure with hypoxia. - uses 3 liters oxygen with exertion and at night with CPAP   Obesity. - discussed importance of weight loss  Sinus node dysfunction, PAF, S/P PM, Chronic diastolic CHF. - followed by Dr. Caryl Comes with [facility]  Rt knee infection. - she is followed by Dr. Adriana Mccallum with Orthopedics  Time Spent Involved in Patient Care on Day of Examination:  37 minutes  Follow up:   Patient Instructions  Do the following sequence in the morning and the evening: mucinex, then albuterol in your nebulizer, and then the flutter valve.  Continue this until you feel that your chest congestion is better.  Continue using Breztri twice per day with your spacer device.  Call to get script for daliresp if you feel like your congestion isn't getting better.  Your goal oxygen is above 90%.  Follow up in 4 months.  Medication List:   Allergies as of 01/12/2022       Reactions   Dulaglutide Nausea Only, Other (See Comments), Cough   Made the patient sick to her stomach (only) several days after using it   Oxycodone Itching, Other (See Comments)   Pt still takes this med even thought it causes itching   Trelegy Ellipta [fluticasone-umeclidin-vilant] Nausea Only, Other (See Comments), Cough   Made the patient sick to her stomach (only) several days after using it         Medication List        Accurate as of January 12, 2022 10:41 AM. If you have any questions, ask your nurse or doctor.          STOP taking these medications    daptomycin  IVPB Commonly known as: CUBICIN Stopped by:  Chesley Mires, MD   losartan 50 MG tablet Commonly known as: COZAAR Stopped by: Chesley Mires, MD   TUSSIN DM MAX SUGAR-FREE PO Stopped by: Chesley Mires, MD       TAKE these medications    acetaminophen 500 MG tablet Commonly known as: TYLENOL Take 1,000-1,500 mg by mouth every 8 (eight) hours as needed for mild pain, moderate pain or headache.   albuterol (2.5 MG/3ML) 0.083% nebulizer solution Commonly known as: PROVENTIL Take 3 mLs (2.5 mg total) by nebulization every 2 (two) hours as needed for wheezing or shortness of breath. What changed: when to take this   albuterol 108 (90 Base) MCG/ACT inhaler Commonly known as: VENTOLIN HFA INHALE 2 PUFFS INTO THE LUNGS EVERY 6 HOURS AS NEEDED FOR WHEEZING OR SHORTNESS OF BREATH What changed: Another medication with the same name was changed. Make sure you understand how and when to  take each.   ALLERGY RELIEF PO Take 1 tablet by mouth daily as needed (allergies).   allopurinol 100 MG tablet Commonly known as: ZYLOPRIM Take 200 mg by mouth daily.   amiodarone 200 MG tablet Commonly known as: PACERONE Take 1 tablet (200 mg total) by mouth daily.   amLODipine 5 MG tablet Commonly known as: NORVASC Take 1 tablet (5 mg total) by mouth daily.   apixaban 2.5 MG Tabs tablet Commonly known as: ELIQUIS Take 1 tablet (2.5 mg total) by mouth 2 (two) times daily.   ARTIFICIAL TEARS OP Place 1 drop into both eyes 3 (three) times daily as needed (for dryness).   ascorbic acid 500 MG tablet Commonly known as: VITAMIN C Take 500 mg by mouth daily with lunch.   B-D SINGLE USE SWABS REGULAR Pads   Breztri Aerosphere 160-9-4.8 MCG/ACT Aero Generic drug: Budeson-Glycopyrrol-Formoterol INHALE 2 PUFFS INTO THE LUNGS IN THE MORNING AND AT BEDTIME.   cyanocobalamin 1000 MCG tablet Commonly known as: VITAMIN B12 Take 1,000 mcg by mouth daily.   ferrous sulfate 325 (65 FE) MG tablet Take 1 tablet (325 mg total) by mouth daily with  breakfast.   fluticasone 50 MCG/ACT nasal spray Commonly known as: FLONASE USE 2 SPRAYS NASALLY DAILY What changed:  how much to take how to take this when to take this additional instructions   gabapentin 300 MG capsule Commonly known as: NEURONTIN Take 1 capsule (300 mg total) by mouth 3 (three) times daily.   guaiFENesin 600 MG 12 hr tablet Commonly known as: Mucinex Take 2 tablets (1,200 mg total) by mouth 2 (two) times daily as needed for to loosen phlegm or cough. Started by: Chesley Mires, MD   HYDROcodone-acetaminophen 10-325 MG tablet Commonly known as: NORCO Take 1 tablet by mouth 3 (three) times daily as needed for moderate pain.   insulin aspart 100 UNIT/ML FlexPen Commonly known as: NOVOLOG Inject 10-17 Units into the skin 3 (three) times daily with meals.   insulin glargine 100 UNIT/ML injection Commonly known as: LANTUS Inject 0.55 mLs (55 Units total) into the skin 2 (two) times daily. What changed: how much to take   levothyroxine 200 MCG tablet Commonly known as: SYNTHROID Take 200 mcg by mouth daily before breakfast.   metoprolol succinate 100 MG 24 hr tablet Commonly known as: TOPROL-XL TAKE 1 TABLET EVERY DAY WITH OR IMMEDIATELY AFTER A MEAL   naloxone 4 MG/0.1ML Liqd nasal spray kit Commonly known as: NARCAN Place 4 mg into the nose once as needed for opioid reversal.   nitroGLYCERIN 0.4 MG SL tablet Commonly known as: NITROSTAT Place 1 tablet (0.4 mg total) under the tongue every 5 (five) minutes as needed for chest pain.   PRESCRIPTION MEDICATION CPAP- At bedtime and as needed during any time of rest (if shortness of breath is present)   torsemide 20 MG tablet Commonly known as: DEMADEX TAKE 3 TABLETS (60 MG TOTAL) BY MOUTH EVERY OTHER DAY.   True Metrix Blood Glucose Test test strip Generic drug: glucose blood   True Metrix Level 1 Low Soln   TRUEplus Lancets 28G Misc   vitamin D3 25 MCG tablet Commonly known as:  CHOLECALCIFEROL Take 1,000 Units by mouth every morning.        Signature:  Chesley Mires, MD [place] Pager - [phone] 01/12/2022, 10:41 AM

## 2022-01-12 NOTE — Patient Instructions (Signed)
Do the following sequence in the morning and the evening: mucinex, then albuterol in your nebulizer, and then the flutter valve.  Continue this until you feel that your chest congestion is better.  Continue using Breztri twice per day with your spacer device.  Call to get script for daliresp if you feel like your congestion isn't getting better.  Your goal oxygen is above 90%.  Follow up in 4 months.

## 2022-01-14 ENCOUNTER — Encounter (HOSPITAL_COMMUNITY): Payer: Self-pay | Admitting: Emergency Medicine

## 2022-01-14 ENCOUNTER — Other Ambulatory Visit: Payer: Self-pay

## 2022-01-14 ENCOUNTER — Emergency Department (HOSPITAL_COMMUNITY): Payer: Medicare HMO

## 2022-01-14 ENCOUNTER — Ambulatory Visit: Payer: Medicare HMO | Admitting: Podiatry

## 2022-01-14 ENCOUNTER — Emergency Department (HOSPITAL_COMMUNITY)
Admission: EM | Admit: 2022-01-14 | Discharge: 2022-01-14 | Disposition: A | Discharge status: Home / Self Care | Payer: Medicare HMO | Attending: Emergency Medicine | Admitting: Emergency Medicine

## 2022-01-14 DIAGNOSIS — R000 Tachycardia, unspecified: Secondary | ICD-10-CM | POA: Diagnosis not present

## 2022-01-14 DIAGNOSIS — Z794 Long term (current) use of insulin: Secondary | ICD-10-CM | POA: Diagnosis not present

## 2022-01-14 DIAGNOSIS — I6782 Cerebral ischemia: Secondary | ICD-10-CM | POA: Diagnosis not present

## 2022-01-14 DIAGNOSIS — R0902 Hypoxemia: Secondary | ICD-10-CM | POA: Diagnosis not present

## 2022-01-14 DIAGNOSIS — I10 Essential (primary) hypertension: Secondary | ICD-10-CM | POA: Diagnosis not present

## 2022-01-14 DIAGNOSIS — S199XXA Unspecified injury of neck, initial encounter: Secondary | ICD-10-CM | POA: Diagnosis not present

## 2022-01-14 DIAGNOSIS — Z7901 Long term (current) use of anticoagulants: Secondary | ICD-10-CM | POA: Diagnosis not present

## 2022-01-14 DIAGNOSIS — M549 Dorsalgia, unspecified: Secondary | ICD-10-CM | POA: Diagnosis not present

## 2022-01-14 DIAGNOSIS — M2578 Osteophyte, vertebrae: Secondary | ICD-10-CM | POA: Diagnosis not present

## 2022-01-14 DIAGNOSIS — I4891 Unspecified atrial fibrillation: Secondary | ICD-10-CM | POA: Insufficient documentation

## 2022-01-14 DIAGNOSIS — Z981 Arthrodesis status: Secondary | ICD-10-CM | POA: Diagnosis not present

## 2022-01-14 DIAGNOSIS — W19XXXA Unspecified fall, initial encounter: Secondary | ICD-10-CM | POA: Insufficient documentation

## 2022-01-14 DIAGNOSIS — S0990XA Unspecified injury of head, initial encounter: Secondary | ICD-10-CM | POA: Diagnosis not present

## 2022-01-14 DIAGNOSIS — R001 Bradycardia, unspecified: Secondary | ICD-10-CM | POA: Diagnosis not present

## 2022-01-14 DIAGNOSIS — I6523 Occlusion and stenosis of bilateral carotid arteries: Secondary | ICD-10-CM | POA: Diagnosis not present

## 2022-01-14 DIAGNOSIS — M545 Low back pain, unspecified: Secondary | ICD-10-CM | POA: Insufficient documentation

## 2022-01-14 DIAGNOSIS — M47816 Spondylosis without myelopathy or radiculopathy, lumbar region: Secondary | ICD-10-CM | POA: Diagnosis not present

## 2022-01-14 MED ORDER — PREDNISONE 20 MG PO TABS
60.0000 mg | ORAL_TABLET | ORAL | Status: AC
Start: 2022-01-14 — End: 2022-01-14
  Administered 2022-01-14: 60 mg via ORAL
  Filled 2022-01-14: qty 3

## 2022-01-14 MED ORDER — PREDNISONE 20 MG PO TABS: 40.0000 mg | ORAL_TABLET | Freq: Every day | ORAL | 0 refills | Status: DC

## 2022-01-14 MED ORDER — HYDROMORPHONE HCL 1 MG/ML IJ SOLN
1.0000 mg | Freq: Once | INTRAMUSCULAR | Status: AC
  Administered 2022-01-14: 1 mg via INTRAVENOUS
  Filled 2022-01-14: qty 1

## 2022-01-14 NOTE — Discharge Instructions (Addendum)
As discussed, you likely have ongoing pain in your lumbar and mid back for some time, but if you develop new, or concerning changes do not hesitate to return here or follow-up with your orthopedist.

## 2022-01-14 NOTE — ED Provider Notes (Signed)
[facility] DEPT Provider Note   CSN: [id number] Arrival date & time: 01/14/22  1247     History  Chief Complaint  Patient presents with   Fall   Back Pain    Kayla [name] is a 81 y.o. female.  Elderly female with multiple medical issues including A-fib on Eliquis, as well as home oxygen dependency presents after mechanical fall with pain throughout her back, primarily in the midportion.  No new weakness in any extremity, no head trauma that she recalls and currently no head pain, neck pain.  She does have a history, however, of neck surgery.     Home Medications Prior to Admission medications   Medication Sig Start Date End Date Taking? Authorizing Provider  predniSONE (DELTASONE) 20 MG tablet Take 2 tablets (40 mg total) by mouth daily with breakfast. For the next four days 01/14/22  Yes Carmin Muskrat, MD  acetaminophen (TYLENOL) 500 MG tablet Take 1,000-1,500 mg by mouth every 8 (eight) hours as needed for mild pain, moderate pain or headache.    [provider]  albuterol (PROVENTIL) (2.5 MG/3ML) 0.083% nebulizer solution Take 3 mLs (2.5 mg total) by nebulization every 2 (two) hours as needed for wheezing or shortness of breath. Patient taking differently: Take 2.5 mg by nebulization in the morning, at noon, and at bedtime. 05/15/20   Chesley Mires, MD  albuterol (VENTOLIN HFA) 108 (90 Base) MCG/ACT inhaler INHALE 2 PUFFS INTO THE LUNGS EVERY 6 HOURS AS NEEDED FOR WHEEZING OR SHORTNESS OF BREATH 08/11/21   Tanda Rockers, MD  Alcohol Swabs (B-D SINGLE USE SWABS REGULAR) PADS  11/24/19   [provider]  allopurinol (ZYLOPRIM) 100 MG tablet Take 200 mg by mouth daily. 04/16/19   [provider]  amiodarone (PACERONE) 200 MG tablet Take 1 tablet (200 mg total) by mouth daily. 08/07/21   Jerline Pain, MD  amLODipine (NORVASC) 5 MG tablet Take 1 tablet (5 mg total) by mouth daily. 03/10/21   Jerline Pain, MD  apixaban  (ELIQUIS) 2.5 MG TABS tablet Take 1 tablet (2.5 mg total) by mouth 2 (two) times daily. 11/10/21   Jerline Pain, MD  ascorbic acid (VITAMIN C) 500 MG tablet Take 500 mg by mouth daily with lunch.    [provider]  Blood Glucose Calibration (TRUE METRIX LEVEL 1) Low SOLN  11/27/19   [provider]  BREZTRI AEROSPHERE 160-9-4.8 MCG/ACT AERO INHALE 2 PUFFS INTO THE LUNGS IN THE MORNING AND AT BEDTIME. 01/15/21   Chesley Mires, MD  Carboxymethylcellulose Sodium (ARTIFICIAL TEARS OP) Place 1 drop into both eyes 3 (three) times daily as needed (for dryness).    [provider]  Chlorpheniramine Maleate (ALLERGY RELIEF PO) Take 1 tablet by mouth daily as needed (allergies).    [provider]  ferrous sulfate 325 (65 FE) MG tablet Take 1 tablet (325 mg total) by mouth daily with breakfast. 01/26/20   Deboraha Sprang, MD  fluticasone (FLONASE) 50 MCG/ACT nasal spray USE 2 SPRAYS NASALLY DAILY Patient taking differently: Place 1 spray into both nostrils 2 (two) times daily. 02/26/17   Collene Gobble, MD  gabapentin (NEURONTIN) 300 MG capsule Take 1 capsule (300 mg total) by mouth 3 (three) times daily. 07/27/21   Regalado, Belkys A, MD  guaiFENesin (MUCINEX) 600 MG 12 hr tablet Take 2 tablets (1,200 mg total) by mouth 2 (two) times daily as needed for to loosen phlegm or cough. 01/12/22   Sood,  Vineet, MD  HYDROcodone-acetaminophen (NORCO) 10-325 MG tablet Take 1 tablet by mouth 3 (three) times daily as needed for moderate pain.    [provider]  insulin aspart (NOVOLOG) 100 UNIT/ML FlexPen Inject 10-17 Units into the skin 3 (three) times daily with meals.    [provider]  insulin glargine (LANTUS) 100 UNIT/ML injection Inject 0.55 mLs (55 Units total) into the skin 2 (two) times daily. Patient taking differently: Inject 50 Units into the skin 2 (two) times daily. 07/27/21   Regalado, Belkys A, MD  levothyroxine (SYNTHROID) 200 MCG tablet Take 200 mcg by  mouth daily before breakfast.    [provider]  metoprolol succinate (TOPROL-XL) 100 MG 24 hr tablet TAKE 1 TABLET EVERY DAY WITH OR IMMEDIATELY AFTER A MEAL 11/14/20   Deboraha Sprang, MD  naloxone [facility]) nasal spray 4 mg/0.1 mL Place 4 mg into the nose once as needed for opioid reversal. 12/04/20   [provider]  nitroGLYCERIN (NITROSTAT) 0.4 MG SL tablet Place 1 tablet (0.4 mg total) under the tongue every 5 (five) minutes as needed for chest pain. 04/13/19   Jerline Pain, MD  PRESCRIPTION MEDICATION CPAP- At bedtime and as needed during any time of rest (if shortness of breath is present)    [provider]  torsemide (DEMADEX) 20 MG tablet TAKE 3 TABLETS (60 MG TOTAL) BY MOUTH EVERY OTHER DAY. 11/06/21   Jerline Pain, MD  TRUE METRIX BLOOD GLUCOSE TEST test strip  09/18/17   [provider]  TRUEplus Lancets 28G [place]  11/24/19   [provider]  vitamin B-12 (CYANOCOBALAMIN) 1000 MCG tablet Take 1,000 mcg by mouth daily.    [provider]  Vitamin D3 (VITAMIN D) 25 MCG tablet Take 1,000 Units by mouth every morning.    [provider]      Allergies    Dulaglutide, Oxycodone, and Trelegy ellipta [fluticasone-umeclidin-vilant]    Review of Systems   Review of Systems  Skin:  Positive for wound.  All other systems reviewed and are negative.   Physical Exam Updated Vital Signs BP (!) 140/63 (BP Location: Left Arm)   Pulse 74   Temp 98.4 F (36.9 C) (Oral)   Resp 18   SpO2 94%  Physical Exam Vitals and nursing note reviewed.  Constitutional:      General: She is not in acute distress.    Appearance: She is well-developed. She is obese. She is ill-appearing. She is not toxic-appearing.  HENT:     Head: Normocephalic and atraumatic.  Eyes:     Conjunctiva/sclera: Conjunctivae normal.  Cardiovascular:     Rate and Rhythm: Normal rate and regular rhythm.  Pulmonary:     Effort: Pulmonary effort is normal. No  respiratory distress.     Breath sounds: Normal breath sounds. No stridor.  Abdominal:     General: There is no distension.  Skin:    General: Skin is warm and dry.       Neurological:     General: No focal deficit present.     Mental Status: She is alert and oriented to person, place, and time.     Cranial Nerves: No cranial nerve deficit.     Motor: No weakness, tremor, atrophy or abnormal muscle tone.  Psychiatric:        Mood and Affect: Mood normal.     ED Results / Procedures / Treatments   Labs (all labs ordered are listed, but only abnormal  results are displayed) Labs Reviewed - No data to display  EKG None  Radiology CT Head Wo Contrast  Result Date: 01/14/2022 CLINICAL DATA:  Polytrauma, blunt fall, on Eliquis. EXAM: CT HEAD WITHOUT CONTRAST TECHNIQUE: Contiguous axial images were obtained from the base of the skull through the vertex without intravenous contrast. RADIATION DOSE REDUCTION: This exam was performed according to the departmental dose-optimization program which includes automated exposure control, adjustment of the mA and/or kV according to patient size and/or use of iterative reconstruction technique. COMPARISON:  Head MRI 11/17/2017 and CT 10/30/2017 FINDINGS: The study is mildly motion degraded. Brain: There is no evidence of an acute infarct, intracranial hemorrhage, mass, midline shift, or extra-axial fluid collection. There is mild cerebral atrophy. Hypodensities in the cerebral white matter bilaterally have progressed and are nonspecific but compatible with mild chronic small vessel ischemic disease. 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 Vascular: Calcified atherosclerosis at the skull base. No hyperdense vessel. Skull: No acute fracture or suspicious osseous lesion. Sinuses/Orbits: Visualized paranasal sinuses and mastoid air cells are clear. Bilateral cataract extraction. Other: None. IMPRESSION: 1. No evidence of acute  intracranial abnormality. 2. Mild chronic small vessel ischemic disease. Electronically Signed   By: Logan Bores M.D.   On: 01/14/2022 15:06   CT Cervical Spine Wo Contrast  Result Date: 01/14/2022 CLINICAL DATA:  Trauma. EXAM: CT CERVICAL SPINE WITHOUT CONTRAST TECHNIQUE: Multidetector CT imaging of the cervical spine was performed without intravenous contrast. Multiplanar CT image reconstructions were also generated. RADIATION DOSE REDUCTION: This exam was performed according to the departmental dose-optimization program which includes automated exposure control, adjustment of the mA and/or kV according to patient size and/or use of iterative reconstruction technique. COMPARISON:  Cervical spine CT 10/30/2017 FINDINGS: Alignment: Normal. Skull base and vertebrae: No acute fracture. No primary bone lesion or focal pathologic process. Anterior fusion plate is seen at C4, C5, C6 and C7. There is no evidence for hardware complication. Soft tissues and spinal canal: No prevertebral fluid or swelling. No visible canal hematoma. Disc levels: There is disc space narrowing and endplate osteophyte formation at C3-C4 and C7-T1 compatible with degenerative change. There is no severe central canal or neural foraminal stenosis at any level. Upper chest: Negative. Other: Thyroidectomy changes are present. Left-sided pacemaker is partially visualized. There are atherosclerotic calcifications of the bilateral carotid artery bifurcations. IMPRESSION: 1. No acute fracture or subluxation. 2. C4-C7 ACDF appears stable.  No complication. Electronically Signed   By: Ronney Asters M.D.   On: 01/14/2022 15:05   DG Lumbar Spine Complete  Result Date: 01/14/2022 CLINICAL DATA:  [place]. Back pain. EXAM: LUMBAR SPINE - COMPLETE 4+ VIEW COMPARISON:  Lumbar spine MRI from 2010 FINDINGS: Interbody Ray cage fusion noted at L5-S1. No complicating features. Moderate age related degenerative lumbar spondylosis with multilevel disc disease and  facet disease. No acute lumbar fracture is identified. The visualized bony pelvis is intact. No hip or pelvic fractures. IMPRESSION: 1. No acute bony findings. 2. Age related degenerative lumbar spondylosis. 3. Interbody Ray cage fusion at L5-S1. Electronically Signed   By: Marijo Sanes M.D.   On: 01/14/2022 14:49   DG Thoracic Spine 4V  Result Date: 01/14/2022 CLINICAL DATA:  [place]. Back pain. EXAM: THORACIC SPINE - 4+ VIEW COMPARISON:  Chest x-ray 12/12/2021 FINDINGS: Normal alignment of the thoracic vertebral bodies. No acute fracture is identified. No abnormal paraspinal soft tissue swelling. The visualized posterior ribs are grossly intact. Stable cervical spine fusion hardware  and priors thyroid surgery. IMPRESSION: Normal alignment and no acute bony findings. Electronically Signed   By: Marijo Sanes M.D.   On: 01/14/2022 14:47    Procedures Procedures    Medications Ordered in ED Medications  predniSONE (DELTASONE) tablet 60 mg (has no administration in time range)  HYDROmorphone (DILAUDID) injection 1 mg (1 mg Intravenous Given 01/14/22 1357)    ED Course/ Medical Decision Making/ A&P This patient with a Hx of multiple medical problems, chronic pain, obesity, home oxygen dependency presents to the ED for concern of pain in her lower back following a fall, this involves an extensive number of treatment options, and is a complaint that carries with it a high risk of complications and morbidity.    The differential diagnosis includes contusion, fracture, compression fracture, nervous system disruption, intracranial abnormality given potential head trauma and she is on Eliquis   Social Determinants of Health:  Obesity, home oxygen dependency, limited mobility  Additional history obtained:  Additional history and/or information obtained from husband at bedside, notable for HPI   After the initial evaluation, orders, including: CT head, CT neck, x-ray of thoracic and lumbar spine,  narcotics as well as hemostatic agent applied to her bleeding to were initiated.   Patient placed on Cardiac and Pulse-Oximetry Monitors. The patient was maintained on a cardiac monitor.  The cardiac monitored showed an rhythm of 75 sinus normal The patient was also maintained on pulse oximetry. The readings were typically 100% room air normal   On repeat evaluation of the patient improved  Imaging Studies ordered:  I independently visualized and interpreted imaging which showed head CT, neck CT both unremarkable.  Thoracic and lumbar spine notable for substantial degenerative disease, prior surgery, no obvious new fracture I agree with the radiologist interpretation  3:26 PM Patient awake, alert, smiling.  IV Dilaudid has improved pain.  She is moving her extremities spontaneously has had no recurrence of bleeding and to.  No neuro complaints, and the patient notes that she is comfortable recovering at home. Dispostion / Final MDM:  After consideration of the diagnostic results and the patient's response to treatment, this adult female, obese, on home oxygen, with chronic pain presents after mechanical fall, seemingly landing on her backside, but possibly also with head trauma.  No evidence for intracranial abnormality, no hemorrhage.  No new neurologic complaints nor findings.  Patient reassuring radiographic studies, was unremarkable for hours of monitoring, was discharged in stable condition.  Final Clinical Impression(s) / ED Diagnoses Final diagnoses:  Fall, initial encounter    Rx / DC Orders ED Discharge Orders          Ordered    predniSONE (DELTASONE) 20 MG tablet  Daily with breakfast        01/14/22 1524              Carmin Muskrat, MD 01/14/22 1526

## 2022-01-14 NOTE — ED Triage Notes (Addendum)
BIBA Per EMS: Pt coming from home w/ c/o mechanical fall onto bottom. C/o back pain. Denies hitting head, denies LOC.  VSS 22 G L FA 52mg fentanyl given en route

## 2022-01-19 ENCOUNTER — Ambulatory Visit (INDEPENDENT_AMBULATORY_CARE_PROVIDER_SITE_OTHER): Payer: Self-pay | Admitting: Podiatry

## 2022-01-19 ENCOUNTER — Ambulatory Visit (INDEPENDENT_AMBULATORY_CARE_PROVIDER_SITE_OTHER): Payer: Medicare HMO

## 2022-01-19 DIAGNOSIS — I495 Sick sinus syndrome: Secondary | ICD-10-CM | POA: Diagnosis not present

## 2022-01-19 DIAGNOSIS — Z91199 Patient's noncompliance with other medical treatment and regimen due to unspecified reason: Secondary | ICD-10-CM

## 2022-01-19 LAB — CUP PACEART REMOTE DEVICE CHECK
Battery Remaining Longevity: 42 mo
Battery Voltage: 2.97 V
Brady Statistic AP VP Percent: 3.39 %
Brady Statistic AP VS Percent: 95.58 %
Brady Statistic AS VP Percent: 0.06 %
Brady Statistic AS VS Percent: 0.97 %
Brady Statistic RA Percent Paced: 98.91 %
Brady Statistic RV Percent Paced: 3.68 %
Date Time Interrogation Session: 20230916225526
Implantable Lead Implant Date: 20170707
Implantable Lead Implant Date: 20170707
Implantable Lead Location: 753859
Implantable Lead Location: 753860
Implantable Lead Model: 3830
Implantable Lead Model: 5076
Implantable Pulse Generator Implant Date: 20170707
Lead Channel Impedance Value: 266 Ohm
Lead Channel Impedance Value: 361 Ohm
Lead Channel Impedance Value: 380 Ohm
Lead Channel Impedance Value: 399 Ohm
Lead Channel Pacing Threshold Amplitude: 0.75 V
Lead Channel Pacing Threshold Amplitude: 1.875 V
Lead Channel Pacing Threshold Pulse Width: 0.4 ms
Lead Channel Pacing Threshold Pulse Width: 0.4 ms
Lead Channel Sensing Intrinsic Amplitude: 3.875 mV
Lead Channel Sensing Intrinsic Amplitude: 3.875 mV
Lead Channel Sensing Intrinsic Amplitude: 5.75 mV
Lead Channel Sensing Intrinsic Amplitude: 5.75 mV
Lead Channel Setting Pacing Amplitude: 1.5 V
Lead Channel Setting Pacing Amplitude: 2.5 V
Lead Channel Setting Pacing Pulse Width: 1 ms
Lead Channel Setting Sensing Sensitivity: 2.8 mV

## 2022-01-19 NOTE — Progress Notes (Signed)
No show

## 2022-01-20 ENCOUNTER — Encounter (HOSPITAL_COMMUNITY): Payer: Medicare HMO

## 2022-01-20 ENCOUNTER — Other Ambulatory Visit: Payer: Self-pay

## 2022-01-20 ENCOUNTER — Encounter (HOSPITAL_COMMUNITY): Payer: Self-pay

## 2022-01-20 ENCOUNTER — Telehealth: Payer: Self-pay | Admitting: *Deleted

## 2022-01-20 ENCOUNTER — Inpatient Hospital Stay (HOSPITAL_COMMUNITY)
Admission: EM | Admit: 2022-01-20 | Discharge: 2022-01-25 | DRG: 616 | Disposition: A | Discharge status: Home / Self Care | Payer: Medicare HMO | Attending: Internal Medicine | Admitting: Internal Medicine

## 2022-01-20 ENCOUNTER — Emergency Department (HOSPITAL_COMMUNITY): Payer: Medicare HMO

## 2022-01-20 DIAGNOSIS — N1832 Chronic kidney disease, stage 3b: Secondary | ICD-10-CM | POA: Diagnosis present

## 2022-01-20 DIAGNOSIS — Z9981 Dependence on supplemental oxygen: Secondary | ICD-10-CM

## 2022-01-20 DIAGNOSIS — I2510 Atherosclerotic heart disease of native coronary artery without angina pectoris: Secondary | ICD-10-CM | POA: Diagnosis present

## 2022-01-20 DIAGNOSIS — Z79890 Hormone replacement therapy (postmenopausal): Secondary | ICD-10-CM

## 2022-01-20 DIAGNOSIS — I169 Hypertensive crisis, unspecified: Secondary | ICD-10-CM | POA: Diagnosis not present

## 2022-01-20 DIAGNOSIS — Z794 Long term (current) use of insulin: Secondary | ICD-10-CM

## 2022-01-20 DIAGNOSIS — E1122 Type 2 diabetes mellitus with diabetic chronic kidney disease: Secondary | ICD-10-CM | POA: Diagnosis present

## 2022-01-20 DIAGNOSIS — Z22322 Carrier or suspected carrier of Methicillin resistant Staphylococcus aureus: Secondary | ICD-10-CM

## 2022-01-20 DIAGNOSIS — Z96652 Presence of left artificial knee joint: Secondary | ICD-10-CM | POA: Diagnosis present

## 2022-01-20 DIAGNOSIS — I10 Essential (primary) hypertension: Secondary | ICD-10-CM | POA: Diagnosis present

## 2022-01-20 DIAGNOSIS — I130 Hypertensive heart and chronic kidney disease with heart failure and stage 1 through stage 4 chronic kidney disease, or unspecified chronic kidney disease: Secondary | ICD-10-CM | POA: Diagnosis present

## 2022-01-20 DIAGNOSIS — I480 Paroxysmal atrial fibrillation: Secondary | ICD-10-CM | POA: Diagnosis present

## 2022-01-20 DIAGNOSIS — J9611 Chronic respiratory failure with hypoxia: Secondary | ICD-10-CM | POA: Diagnosis present

## 2022-01-20 DIAGNOSIS — I890 Lymphedema, not elsewhere classified: Secondary | ICD-10-CM | POA: Diagnosis present

## 2022-01-20 DIAGNOSIS — D638 Anemia in other chronic diseases classified elsewhere: Secondary | ICD-10-CM | POA: Diagnosis present

## 2022-01-20 DIAGNOSIS — Z9049 Acquired absence of other specified parts of digestive tract: Secondary | ICD-10-CM

## 2022-01-20 DIAGNOSIS — M1990 Unspecified osteoarthritis, unspecified site: Secondary | ICD-10-CM | POA: Diagnosis present

## 2022-01-20 DIAGNOSIS — M86672 Other chronic osteomyelitis, left ankle and foot: Secondary | ICD-10-CM | POA: Diagnosis not present

## 2022-01-20 DIAGNOSIS — E039 Hypothyroidism, unspecified: Secondary | ICD-10-CM | POA: Diagnosis not present

## 2022-01-20 DIAGNOSIS — M2012 Hallux valgus (acquired), left foot: Secondary | ICD-10-CM | POA: Diagnosis not present

## 2022-01-20 DIAGNOSIS — R0602 Shortness of breath: Secondary | ICD-10-CM | POA: Diagnosis not present

## 2022-01-20 DIAGNOSIS — I83029 Varicose veins of left lower extremity with ulcer of unspecified site: Secondary | ICD-10-CM | POA: Diagnosis not present

## 2022-01-20 DIAGNOSIS — I83019 Varicose veins of right lower extremity with ulcer of unspecified site: Secondary | ICD-10-CM | POA: Diagnosis not present

## 2022-01-20 DIAGNOSIS — L97919 Non-pressure chronic ulcer of unspecified part of right lower leg with unspecified severity: Secondary | ICD-10-CM | POA: Diagnosis not present

## 2022-01-20 DIAGNOSIS — Z9889 Other specified postprocedural states: Secondary | ICD-10-CM | POA: Diagnosis not present

## 2022-01-20 DIAGNOSIS — L089 Local infection of the skin and subcutaneous tissue, unspecified: Secondary | ICD-10-CM | POA: Diagnosis not present

## 2022-01-20 DIAGNOSIS — R600 Localized edema: Secondary | ICD-10-CM | POA: Diagnosis not present

## 2022-01-20 DIAGNOSIS — E1165 Type 2 diabetes mellitus with hyperglycemia: Secondary | ICD-10-CM | POA: Diagnosis present

## 2022-01-20 DIAGNOSIS — J449 Chronic obstructive pulmonary disease, unspecified: Secondary | ICD-10-CM | POA: Diagnosis not present

## 2022-01-20 DIAGNOSIS — Z888 Allergy status to other drugs, medicaments and biological substances status: Secondary | ICD-10-CM

## 2022-01-20 DIAGNOSIS — Z8249 Family history of ischemic heart disease and other diseases of the circulatory system: Secondary | ICD-10-CM

## 2022-01-20 DIAGNOSIS — L97929 Non-pressure chronic ulcer of unspecified part of left lower leg with unspecified severity: Secondary | ICD-10-CM | POA: Diagnosis not present

## 2022-01-20 DIAGNOSIS — I96 Gangrene, not elsewhere classified: Secondary | ICD-10-CM | POA: Diagnosis not present

## 2022-01-20 DIAGNOSIS — E1140 Type 2 diabetes mellitus with diabetic neuropathy, unspecified: Secondary | ICD-10-CM | POA: Diagnosis present

## 2022-01-20 DIAGNOSIS — Z981 Arthrodesis status: Secondary | ICD-10-CM

## 2022-01-20 DIAGNOSIS — M353 Polymyalgia rheumatica: Secondary | ICD-10-CM | POA: Diagnosis present

## 2022-01-20 DIAGNOSIS — L97529 Non-pressure chronic ulcer of other part of left foot with unspecified severity: Secondary | ICD-10-CM | POA: Diagnosis not present

## 2022-01-20 DIAGNOSIS — K219 Gastro-esophageal reflux disease without esophagitis: Secondary | ICD-10-CM | POA: Diagnosis present

## 2022-01-20 DIAGNOSIS — Z7901 Long term (current) use of anticoagulants: Secondary | ICD-10-CM

## 2022-01-20 DIAGNOSIS — E1159 Type 2 diabetes mellitus with other circulatory complications: Secondary | ICD-10-CM | POA: Diagnosis not present

## 2022-01-20 DIAGNOSIS — E785 Hyperlipidemia, unspecified: Secondary | ICD-10-CM | POA: Diagnosis present

## 2022-01-20 DIAGNOSIS — Z950 Presence of cardiac pacemaker: Secondary | ICD-10-CM

## 2022-01-20 DIAGNOSIS — M546 Pain in thoracic spine: Secondary | ICD-10-CM | POA: Diagnosis not present

## 2022-01-20 DIAGNOSIS — I272 Pulmonary hypertension, unspecified: Secondary | ICD-10-CM | POA: Diagnosis not present

## 2022-01-20 DIAGNOSIS — E1169 Type 2 diabetes mellitus with other specified complication: Secondary | ICD-10-CM | POA: Diagnosis not present

## 2022-01-20 DIAGNOSIS — S92512A Displaced fracture of proximal phalanx of left lesser toe(s), initial encounter for closed fracture: Secondary | ICD-10-CM | POA: Diagnosis not present

## 2022-01-20 DIAGNOSIS — M869 Osteomyelitis, unspecified: Secondary | ICD-10-CM | POA: Diagnosis present

## 2022-01-20 DIAGNOSIS — I152 Hypertension secondary to endocrine disorders: Secondary | ICD-10-CM | POA: Diagnosis present

## 2022-01-20 DIAGNOSIS — Z90710 Acquired absence of both cervix and uterus: Secondary | ICD-10-CM

## 2022-01-20 DIAGNOSIS — Z961 Presence of intraocular lens: Secondary | ICD-10-CM | POA: Diagnosis present

## 2022-01-20 DIAGNOSIS — I83891 Varicose veins of right lower extremities with other complications: Secondary | ICD-10-CM | POA: Diagnosis not present

## 2022-01-20 DIAGNOSIS — Z833 Family history of diabetes mellitus: Secondary | ICD-10-CM

## 2022-01-20 DIAGNOSIS — G4733 Obstructive sleep apnea (adult) (pediatric): Secondary | ICD-10-CM | POA: Diagnosis not present

## 2022-01-20 DIAGNOSIS — L0390 Cellulitis, unspecified: Secondary | ICD-10-CM | POA: Diagnosis not present

## 2022-01-20 DIAGNOSIS — I5033 Acute on chronic diastolic (congestive) heart failure: Secondary | ICD-10-CM | POA: Diagnosis present

## 2022-01-20 DIAGNOSIS — E890 Postprocedural hypothyroidism: Secondary | ICD-10-CM | POA: Diagnosis present

## 2022-01-20 DIAGNOSIS — I878 Other specified disorders of veins: Secondary | ICD-10-CM | POA: Diagnosis present

## 2022-01-20 DIAGNOSIS — Z7952 Long term (current) use of systemic steroids: Secondary | ICD-10-CM

## 2022-01-20 DIAGNOSIS — I83892 Varicose veins of left lower extremities with other complications: Secondary | ICD-10-CM | POA: Diagnosis not present

## 2022-01-20 DIAGNOSIS — M7732 Calcaneal spur, left foot: Secondary | ICD-10-CM | POA: Diagnosis not present

## 2022-01-20 DIAGNOSIS — Z23 Encounter for immunization: Secondary | ICD-10-CM | POA: Diagnosis not present

## 2022-01-20 DIAGNOSIS — M7989 Other specified soft tissue disorders: Secondary | ICD-10-CM | POA: Diagnosis present

## 2022-01-20 DIAGNOSIS — F419 Anxiety disorder, unspecified: Secondary | ICD-10-CM | POA: Diagnosis present

## 2022-01-20 DIAGNOSIS — M86172 Other acute osteomyelitis, left ankle and foot: Secondary | ICD-10-CM | POA: Diagnosis not present

## 2022-01-20 DIAGNOSIS — Z6841 Body Mass Index (BMI) 40.0 and over, adult: Secondary | ICD-10-CM | POA: Diagnosis not present

## 2022-01-20 DIAGNOSIS — E119 Type 2 diabetes mellitus without complications: Secondary | ICD-10-CM

## 2022-01-20 DIAGNOSIS — Z89422 Acquired absence of other left toe(s): Secondary | ICD-10-CM

## 2022-01-20 DIAGNOSIS — Z79899 Other long term (current) drug therapy: Secondary | ICD-10-CM

## 2022-01-20 DIAGNOSIS — Z87891 Personal history of nicotine dependence: Secondary | ICD-10-CM

## 2022-01-20 DIAGNOSIS — E11621 Type 2 diabetes mellitus with foot ulcer: Secondary | ICD-10-CM | POA: Diagnosis not present

## 2022-01-20 DIAGNOSIS — J441 Chronic obstructive pulmonary disease with (acute) exacerbation: Secondary | ICD-10-CM | POA: Diagnosis present

## 2022-01-20 DIAGNOSIS — E11628 Type 2 diabetes mellitus with other skin complications: Secondary | ICD-10-CM | POA: Diagnosis not present

## 2022-01-20 DIAGNOSIS — I495 Sick sinus syndrome: Secondary | ICD-10-CM | POA: Diagnosis present

## 2022-01-20 DIAGNOSIS — Z885 Allergy status to narcotic agent status: Secondary | ICD-10-CM

## 2022-01-20 LAB — C-REACTIVE PROTEIN: CRP: 5.2 mg/dL — ABNORMAL HIGH (ref <1.0)

## 2022-01-20 LAB — COMPREHENSIVE METABOLIC PANEL
ALT: 24 U/L (ref 0–44)
AST: 19 U/L (ref 15–41)
Albumin: 2.4 g/dL — ABNORMAL LOW (ref 3.5–5.0)
Alkaline Phosphatase: 59 U/L (ref 38–126)
Anion gap: 9 (ref 5–15)
BUN: 65 mg/dL — ABNORMAL HIGH (ref 8–23)
CO2: 24 mmol/L (ref 22–32)
Calcium: 8.4 mg/dL — ABNORMAL LOW (ref 8.9–10.3)
Chloride: 107 mmol/L (ref 98–111)
Creatinine, Ser: 1.98 mg/dL — ABNORMAL HIGH (ref 0.44–1.00)
GFR, Estimated: 25 mL/min — ABNORMAL LOW (ref >60)
Glucose, Bld: 184 mg/dL — ABNORMAL HIGH (ref 70–99)
Potassium: 4.8 mmol/L (ref 3.5–5.1)
Sodium: 140 mmol/L (ref 135–145)
Total Bilirubin: 0.3 mg/dL (ref 0.3–1.2)
Total Protein: 6.3 g/dL — ABNORMAL LOW (ref 6.5–8.1)

## 2022-01-20 LAB — CBC WITH DIFFERENTIAL/PLATELET
Abs Immature Granulocytes: 0.18 10*3/uL — ABNORMAL HIGH (ref 0.00–0.07)
Basophils Absolute: 0 10*3/uL (ref 0.0–0.1)
Basophils Relative: 0 %
Eosinophils Absolute: 0 10*3/uL (ref 0.0–0.5)
Eosinophils Relative: 0 %
HCT: 30.7 % — ABNORMAL LOW (ref 36.0–46.0)
Hemoglobin: 9.5 g/dL — ABNORMAL LOW (ref 12.0–15.0)
Immature Granulocytes: 1 %
Lymphocytes Relative: 3 %
Lymphs Abs: 0.6 10*3/uL — ABNORMAL LOW (ref 0.7–4.0)
MCH: 30.2 pg (ref 26.0–34.0)
MCHC: 30.9 g/dL (ref 30.0–36.0)
MCV: 97.5 fL (ref 80.0–100.0)
Monocytes Absolute: 0.3 10*3/uL (ref 0.1–1.0)
Monocytes Relative: 2 %
Neutro Abs: 17.8 10*3/uL — ABNORMAL HIGH (ref 1.7–7.7)
Neutrophils Relative %: 94 %
Platelets: 372 10*3/uL (ref 150–400)
RBC: 3.15 MIL/uL — ABNORMAL LOW (ref 3.87–5.11)
RDW: 18.4 % — ABNORMAL HIGH (ref 11.5–15.5)
WBC: 18.9 10*3/uL — ABNORMAL HIGH (ref 4.0–10.5)
nRBC: 0.2 % (ref 0.0–0.2)

## 2022-01-20 LAB — GLUCOSE, CAPILLARY
Glucose-Capillary: 164 mg/dL — ABNORMAL HIGH (ref 70–99)
Glucose-Capillary: 219 mg/dL — ABNORMAL HIGH (ref 70–99)

## 2022-01-20 LAB — SEDIMENTATION RATE: Sed Rate: 110 mm/hr — ABNORMAL HIGH (ref 0–22)

## 2022-01-20 LAB — LACTIC ACID, PLASMA: Lactic Acid, Venous: 1.4 mmol/L (ref 0.5–1.9)

## 2022-01-20 MED ORDER — HYDROCODONE-ACETAMINOPHEN 10-325 MG PO TABS
1.0000 | ORAL_TABLET | Freq: Four times a day (QID) | ORAL | Status: DC | PRN
  Administered 2022-01-20 – 2022-01-25 (×14): 1 via ORAL
  Filled 2022-01-20 (×15): qty 1

## 2022-01-20 MED ORDER — METOPROLOL SUCCINATE ER 50 MG PO TB24
100.0000 mg | ORAL_TABLET | Freq: Every day | ORAL | Status: DC
  Administered 2022-01-21 – 2022-01-25 (×5): 100 mg via ORAL
  Filled 2022-01-20 (×5): qty 2

## 2022-01-20 MED ORDER — VANCOMYCIN HCL 2000 MG/400ML IV SOLN
2000.0000 mg | INTRAVENOUS | Status: DC
  Administered 2022-01-20: 2000 mg via INTRAVENOUS
  Filled 2022-01-20: qty 400

## 2022-01-20 MED ORDER — ACETAMINOPHEN 325 MG PO TABS
650.0000 mg | ORAL_TABLET | Freq: Four times a day (QID) | ORAL | Status: DC | PRN
  Administered 2022-01-23: 650 mg via ORAL
  Filled 2022-01-20: qty 2

## 2022-01-20 MED ORDER — AMIODARONE HCL 200 MG PO TABS
200.0000 mg | ORAL_TABLET | Freq: Every day | ORAL | Status: DC
  Administered 2022-01-21 – 2022-01-25 (×4): 200 mg via ORAL
  Filled 2022-01-20 (×5): qty 1

## 2022-01-20 MED ORDER — FLUTICASONE PROPIONATE 50 MCG/ACT NA SUSP
1.0000 | Freq: Two times a day (BID) | NASAL | Status: DC
  Administered 2022-01-21 – 2022-01-25 (×8): 1 via NASAL
  Filled 2022-01-20: qty 16

## 2022-01-20 MED ORDER — METRONIDAZOLE 500 MG PO TABS
500.0000 mg | ORAL_TABLET | Freq: Two times a day (BID) | ORAL | Status: DC
  Administered 2022-01-20 – 2022-01-23 (×6): 500 mg via ORAL
  Filled 2022-01-20 (×6): qty 1

## 2022-01-20 MED ORDER — MOMETASONE FURO-FORMOTEROL FUM 100-5 MCG/ACT IN AERO
2.0000 | INHALATION_SPRAY | Freq: Two times a day (BID) | RESPIRATORY_TRACT | Status: DC
  Administered 2022-01-21 – 2022-01-25 (×9): 2 via RESPIRATORY_TRACT
  Filled 2022-01-20: qty 8.8

## 2022-01-20 MED ORDER — MORPHINE SULFATE (PF) 4 MG/ML IV SOLN
4.0000 mg | Freq: Once | INTRAVENOUS | Status: AC
  Administered 2022-01-20: 4 mg via INTRAVENOUS
  Filled 2022-01-20: qty 1

## 2022-01-20 MED ORDER — LEVOTHYROXINE SODIUM 100 MCG PO TABS
200.0000 ug | ORAL_TABLET | Freq: Every day | ORAL | Status: DC
  Administered 2022-01-22 – 2022-01-25 (×4): 200 ug via ORAL
  Filled 2022-01-20 (×4): qty 2

## 2022-01-20 MED ORDER — INSULIN ASPART 100 UNIT/ML IJ SOLN
0.0000 [IU] | Freq: Every day | INTRAMUSCULAR | Status: DC
  Administered 2022-01-20: 2 [IU] via SUBCUTANEOUS
  Administered 2022-01-22: 3 [IU] via SUBCUTANEOUS
  Administered 2022-01-23: 2 [IU] via SUBCUTANEOUS

## 2022-01-20 MED ORDER — UMECLIDINIUM BROMIDE 62.5 MCG/ACT IN AEPB
1.0000 | INHALATION_SPRAY | Freq: Every day | RESPIRATORY_TRACT | Status: DC
  Administered 2022-01-21 – 2022-01-25 (×5): 1 via RESPIRATORY_TRACT
  Filled 2022-01-20: qty 7

## 2022-01-20 MED ORDER — GABAPENTIN 300 MG PO CAPS
300.0000 mg | ORAL_CAPSULE | Freq: Three times a day (TID) | ORAL | Status: DC
  Administered 2022-01-20 – 2022-01-25 (×13): 300 mg via ORAL
  Filled 2022-01-20 (×13): qty 1

## 2022-01-20 MED ORDER — AMLODIPINE BESYLATE 5 MG PO TABS
5.0000 mg | ORAL_TABLET | Freq: Every day | ORAL | Status: DC
  Administered 2022-01-21: 5 mg via ORAL
  Filled 2022-01-20: qty 1

## 2022-01-20 MED ORDER — SODIUM CHLORIDE 0.9 % IV SOLN
2.0000 g | INTRAVENOUS | Status: DC
  Administered 2022-01-21 – 2022-01-22 (×2): 2 g via INTRAVENOUS
  Filled 2022-01-20 (×3): qty 12.5

## 2022-01-20 MED ORDER — ACETAMINOPHEN 650 MG RE SUPP: 650.0000 mg | Freq: Four times a day (QID) | RECTAL | Status: DC | PRN

## 2022-01-20 MED ORDER — VITAMIN B-12 1000 MCG PO TABS
1000.0000 ug | ORAL_TABLET | Freq: Every day | ORAL | Status: DC
  Administered 2022-01-21 – 2022-01-25 (×5): 1000 ug via ORAL
  Filled 2022-01-20 (×5): qty 1

## 2022-01-20 MED ORDER — INSULIN ASPART 100 UNIT/ML IJ SOLN
0.0000 [IU] | Freq: Three times a day (TID) | INTRAMUSCULAR | Status: DC
  Administered 2022-01-21: 2 [IU] via SUBCUTANEOUS
  Administered 2022-01-21 – 2022-01-23 (×5): 3 [IU] via SUBCUTANEOUS
  Administered 2022-01-24: 2 [IU] via SUBCUTANEOUS
  Administered 2022-01-24: 8 [IU] via SUBCUTANEOUS
  Administered 2022-01-24: 5 [IU] via SUBCUTANEOUS
  Administered 2022-01-25: 11 [IU] via SUBCUTANEOUS
  Administered 2022-01-25: 5 [IU] via SUBCUTANEOUS

## 2022-01-20 MED ORDER — SODIUM CHLORIDE 0.9 % IV SOLN
2.0000 g | Freq: Once | INTRAVENOUS | Status: AC
  Administered 2022-01-20: 2 g via INTRAVENOUS
  Filled 2022-01-20: qty 12.5

## 2022-01-20 MED ORDER — LACTATED RINGERS IV BOLUS: 1000.0000 mL | Freq: Once | INTRAVENOUS | Status: DC

## 2022-01-20 MED ORDER — BUDESON-GLYCOPYRROL-FORMOTEROL 160-9-4.8 MCG/ACT IN AERO: 2.0000 | INHALATION_SPRAY | Freq: Two times a day (BID) | RESPIRATORY_TRACT | Status: DC

## 2022-01-20 MED ORDER — INSULIN GLARGINE-YFGN 100 UNIT/ML ~~LOC~~ SOLN
25.0000 [IU] | Freq: Two times a day (BID) | SUBCUTANEOUS | Status: DC
  Administered 2022-01-20 – 2022-01-25 (×9): 25 [IU] via SUBCUTANEOUS
  Filled 2022-01-20 (×11): qty 0.25

## 2022-01-20 MED ORDER — VITAMIN C 500 MG PO TABS
500.0000 mg | ORAL_TABLET | Freq: Every day | ORAL | Status: DC
  Administered 2022-01-21 – 2022-01-25 (×5): 500 mg via ORAL
  Filled 2022-01-20 (×5): qty 1

## 2022-01-20 MED ORDER — VITAMIN D 25 MCG (1000 UNIT) PO TABS
1000.0000 [IU] | ORAL_TABLET | Freq: Every morning | ORAL | Status: DC
  Administered 2022-01-21 – 2022-01-25 (×5): 1000 [IU] via ORAL
  Filled 2022-01-20 (×5): qty 1

## 2022-01-20 MED ORDER — VANCOMYCIN HCL 1250 MG/250ML IV SOLN
1250.0000 mg | Freq: Once | INTRAVENOUS | Status: DC
  Filled 2022-01-20: qty 250

## 2022-01-20 MED ORDER — ALBUTEROL SULFATE (2.5 MG/3ML) 0.083% IN NEBU
2.5000 mg | INHALATION_SOLUTION | RESPIRATORY_TRACT | Status: DC | PRN
  Administered 2022-01-20 – 2022-01-21 (×2): 2.5 mg via RESPIRATORY_TRACT
  Filled 2022-01-20 (×2): qty 3

## 2022-01-20 MED ORDER — OXYCODONE HCL 5 MG PO TABS: 5.0000 mg | ORAL_TABLET | ORAL | Status: DC | PRN

## 2022-01-20 MED ORDER — ALLOPURINOL 100 MG PO TABS
200.0000 mg | ORAL_TABLET | Freq: Every day | ORAL | Status: DC
  Administered 2022-01-21 – 2022-01-25 (×5): 200 mg via ORAL
  Filled 2022-01-20 (×5): qty 2

## 2022-01-20 NOTE — ED Notes (Signed)
ED TO INPATIENT HANDOFF REPORT  Name/Age/Gender Kayla [name] 81 y.o. female  Code Status Code Status History     Date Active Date Inactive Code Status Order ID Comments User Context   07/21/2021 1930 07/27/2021 2037 DNR [id number]  Mariel Aloe, MD Inpatient   05/30/2021 1744 05/31/2021 1732 Full Code [id number]  Irving Copas, PA-C Inpatient   03/19/2021 0015 03/22/2021 1641 DNR [id number]  Lenore Cordia, MD ED   01/30/2021 1654 02/01/2021 2011 Full Code [id number]  Shirley Friar, PA-C Inpatient   10/30/2019 1407 11/02/2019 1814 Full Code [id number]  Oliver Barre, PA Inpatient   08/13/2019 0424 08/16/2019 1515 Full Code [id number]  Shela Leff, MD ED   04/28/2016 1910 04/29/2016 1425 Full Code [id number]  Alphonsa Overall, MD Inpatient   11/08/2015 1354 11/09/2015 1306 Full Code [id number]  Deboraha Sprang, MD Inpatient   11/07/2014 2010 11/08/2014 1231 Full Code [id number]  Kary Kos, MD Inpatient   07/27/2011 1652 07/29/2011 1327 Full Code [id number]  Hubert Azure, RN Inpatient    Questions for Most Recent Historical Code Status (Order [id number])     Question Answer   In the event of cardiac or respiratory ARREST Do not call a "code blue"   In the event of cardiac or respiratory ARREST Do not perform Intubation, CPR, defibrillation or ACLS   In the event of cardiac or respiratory ARREST Use medication by any route, position, wound care, and other measures to relive pain and suffering. May use oxygen, suction and manual treatment of airway obstruction as needed for comfort.            Home/SNF/Other Home  Chief Complaint Toe osteomyelitis, left (HCC) [M86.9]  Level of Care/Admitting Diagnosis ED Disposition     ED Disposition  Admit   Condition  --   Comment  Hospital Area: [facility] [[id number]]  Level of Care: Med-Surg [16]  May admit patient to [facility] or [facility] if equivalent level of care is available:: Yes  Covid Evaluation:  Asymptomatic - no recent exposure (last 10 days) testing not required  Diagnosis: Toe osteomyelitis, left [facility]) [[id number]]  Admitting Physician: Barb Merino [[id number]]  Attending Physician: Barb Merino [[id number]]  Certification:: I certify this patient will need inpatient services for at least 2 midnights  Estimated Length of Stay: 2          Medical History Past Medical History:  Diagnosis Date   Allergy    Anxiety    Arthritis    CAD (coronary artery disease)    Mild per remote cath in 2006, /    Nuclear, January, 2013, no significant abnormality,   Carotid artery disease ([place])    Doppler, January, 2013, 0 98% RIC A., 33-82% LICA, stable   Cataract    CHF (congestive heart failure) ([place])    Chronic kidney disease    Complication of anesthesia    wakes up "mean"   COPD 02/16/2007   Intolerant of Spiriva, tudorza Intolerant of Trelegy    COPD (chronic obstructive pulmonary disease) ([place])    Coronary atherosclerosis 11/24/2008   Catheterization 2006, nonobstructive coronary disease   //   nuclear 2013, low risk     Diabetes mellitus, type 2 ([place])    Diverticula of colon    DIVERTICULOSIS OF COLON 03/23/2007   Qualifier: Diagnosis of  By: Halford Chessman MD, Vineet     Dizziness    occasional   Dizzy spells 05/04/2011   DM (diabetes mellitus) ([place])  03/23/2007   Qualifier: Diagnosis of  By: Halford Chessman MD, Vineet     Dyspnea    On exertion   Ejection fraction    Essential hypertension 02/16/2007   Qualifier: Diagnosis of  By: Rosana Hoes CMA, Tammy     G E R D 03/23/2007   Qualifier: Diagnosis of  By: Halford Chessman MD, Vineet     Gall bladder disease 04/28/2016   Gastritis and gastroduodenitis    GERD (gastroesophageal reflux disease)    Goiter    hx of   Headache(784.0)    migraine   Hyperlipidemia    Hypertension    Hypothyroidism    HYPOTHYROIDISM, POSTSURGICAL 06/04/2008   Qualifier: Diagnosis of  By: Loanne Drilling MD, Sean A    Left ventricular hypertrophy    Leg edema    occasional  swelling   Leg swelling 12/09/2018   Myofascial pain syndrome, cervical 01/06/2018   Obesity    OBESITY 11/24/2008   Qualifier: Diagnosis of  By: Sidney Ace     OBSTRUCTIVE SLEEP APNEA 02/16/2007   Auto CPAP 09/03/13 to 10/02/13 >> used on 30 of 30 nights with average 6 hrs and 3 min.  Average AHI is 3.1 with median CPAP 5 cm H2O and 95 th percentile CPAP 6 cm H2O.     Occipital neuralgia of right side 01/06/2018   OSA (obstructive sleep apnea)    cpap setting of 12   Pain in joint of right hip 01/21/2018   Palpitations 01/21/2011   48 hour Holter, 2015, scattered PACs and PVCs.    Paroxysmal atrial fibrillation (HCC)    Pneumonia    PONV (postoperative nausea and vomiting)    severe after every surgery   Pulmonary hypertension, secondary    RHINITIS 07/21/2010        RUQ abdominal pain    S/P left TKA 07/27/2011   Sinus node dysfunction (HCC) 11/08/2015   Sleep apnea    Spinal stenosis of cervical region 11/07/2014   Urinary frequency 12/09/2018   UTI (urinary tract infection) 08/15/2019   per pt report   VENTRICULAR HYPERTROPHY, LEFT 11/24/2008   Qualifier: Diagnosis of  By: Sidney Ace      Allergies Allergies  Allergen Reactions   Dulaglutide Nausea Only, Other (See Comments) and Cough    Made the patient sick to her stomach (only) several days after using it   Oxycodone Itching and Other (See Comments)    Pt still takes this med even thought it causes itching   Trelegy Ellipta [Fluticasone-Umeclidin-Vilant] Nausea Only, Other (See Comments) and Cough    Made the patient sick to her stomach (only) several days after using it     IV Location/Drains/Wounds Patient Lines/Drains/Airways Status     Active Line/Drains/Airways     Name Placement date Placement time Site Days   Peripheral IV 01/14/22 22 G Left;Posterior;Proximal Forearm 01/14/22  --  Forearm  6   Peripheral IV 01/20/22 20 G Anterior;Left;Proximal Forearm 01/20/22  1604  Forearm  less than 1    Incision (Closed) 07/22/21 Knee Right 07/22/21  1602  -- 182   Wound / Incision (Open or Dehisced) 07/22/21 Other (Comment);Skin tear Knee Right Circular 1cm by 1cm 07/22/21  0300  Knee  182            Labs/Imaging Results for orders placed or performed during the hospital encounter of 01/20/22 (from the past 48 hour(s))  Lactic acid     Status: None   Collection Time: 01/20/22  2:46 PM  Result Value Ref Range   Lactic Acid, Venous 1.4 0.5 - 1.9 mmol/L    Comment: Performed at [facility], [place] [address], [place], [place] [address]  Comprehensive metabolic panel     Status: Abnormal   Collection Time: 01/20/22  3:32 PM  Result Value Ref Range   Sodium 140 135 - 145 mmol/L   Potassium 4.8 3.5 - 5.1 mmol/L   Chloride 107 98 - 111 mmol/L   CO2 24 22 - 32 mmol/L   Glucose, Bld 184 (H) 70 - 99 mg/dL    Comment: Glucose reference range applies only to samples taken after fasting for at least 8 hours.   BUN 65 (H) 8 - 23 mg/dL   Creatinine, Ser 1.98 (H) 0.44 - 1.00 mg/dL   Calcium 8.4 (L) 8.9 - 10.3 mg/dL   Total Protein 6.3 (L) 6.5 - 8.1 g/dL   Albumin 2.4 (L) 3.5 - 5.0 g/dL   AST 19 15 - 41 U/L   ALT 24 0 - 44 U/L   Alkaline Phosphatase 59 38 - 126 U/L   Total Bilirubin 0.3 0.3 - 1.2 mg/dL   GFR, Estimated 25 (L) >60 mL/min    Comment: (NOTE) Calculated using the CKD-EPI Creatinine Equation (2021)    Anion gap 9 5 - 15    Comment: Performed at [facility], [place] [address], [place], [place] [address]  CBC with Differential     Status: Abnormal   Collection Time: 01/20/22  3:32 PM  Result Value Ref Range   WBC 18.9 (H) 4.0 - 10.5 K/uL   RBC 3.15 (L) 3.87 - 5.11 MIL/uL   Hemoglobin 9.5 (L) 12.0 - 15.0 g/dL   HCT 30.7 (L) 36.0 - 46.0 %   MCV 97.5 80.0 - 100.0 fL   MCH 30.2 26.0 - 34.0 pg   MCHC 30.9 30.0 - 36.0 g/dL   RDW 18.4 (H) 11.5 - 15.5 %   Platelets 372 150 - 400 K/uL   nRBC 0.2 0.0 - 0.2 %   Neutrophils Relative % 94 %    Neutro Abs 17.8 (H) 1.7 - 7.7 K/uL   Lymphocytes Relative 3 %   Lymphs Abs 0.6 (L) 0.7 - 4.0 K/uL   Monocytes Relative 2 %   Monocytes Absolute 0.3 0.1 - 1.0 K/uL   Eosinophils Relative 0 %   Eosinophils Absolute 0.0 0.0 - 0.5 K/uL   Basophils Relative 0 %   Basophils Absolute 0.0 0.0 - 0.1 K/uL   Immature Granulocytes 1 %   Abs Immature Granulocytes 0.18 (H) 0.00 - 0.07 K/uL    Comment: Performed at [facility], [place] [address], [place], [place] [address]   DG Foot Complete Left  Result Date: 01/20/2022 CLINICAL DATA:  Left great toe infection. EXAM: LEFT FOOT - COMPLETE 3+ VIEW COMPARISON:  Left foot radiographs 12/15/2019 FINDINGS: Redemonstration of amputation of the distal phalanx of the great toe. Interval removal of prior surgical skin staples. New acute to subacute fracture of the medial and lateral aspects of the base of the proximal phalanx of the second toe with 3 mm cortical step-off of the distal portion of the lateral fracture component and intra-articular extension of both the medial and lateral fractures. There is moderate to high-grade fourth toe soft tissue swelling with irregularity at the distal aspect suggesting a wound. There is distal subcutaneous air. There is moderate erosion and fragmentation of the majority of the distal phalanx of the fourth toe, new  from prior. This is concerning for acute osteomyelitis. Redemonstration of 5 by less than 1 mm metallic density overlying the medial fifth toe soft tissues, likely a chronic foreign body. Moderate plantar calcaneal heel spur. Mild-to-moderate great toe metatarsophalangeal joint space narrowing and peripheral osteophytosis with mild hallux valgus. IMPRESSION: 1. Moderate to high-grade fourth toe soft tissue swelling with distal soft tissue wound and subcutaneous air. Moderate erosion and fragmentation of the majority of the distal phalanx of fourth toe concerning for acute osteomyelitis. 2. New acute to  subacute fracture of the medial and lateral aspects of the base of the proximal phalanx of the second toe. 3. Prior amputation of the distal phalanx of the great toe. 4. Unchanged linear metallic foreign body overlying the medial fifth toe. Electronically Signed   By: Yvonne Kendall M.D.   On: 01/20/2022 15:24    Pending Labs Unresulted Labs (From admission, onward)     Start     Ordered   01/20/22 1446  Sedimentation rate  Once,   URGENT        01/20/22 1447   01/20/22 1446  C-reactive protein  Once,   URGENT        01/20/22 1447   01/20/22 1446  Blood Cultures x 2 sites  BLOOD CULTURE X 2,   STAT      01/20/22 1447   Signed and Held  Basic metabolic panel  Tomorrow morning,   R        Signed and Held   Signed and Held  CBC  Tomorrow morning,   R        Signed and Held            Vitals/Pain Today's Vitals   01/20/22 1424 01/20/22 1425 01/20/22 1530  BP: (!) 128/56  (!) 137/97  Pulse: 64  62  Resp: 18  17  Temp: 99 F (37.2 C)    TempSrc: Oral    SpO2: 95%  99%  Weight:  248 lb (112.5 kg)   Height:  '5\' 5"'$  (1.651 m)   PainSc: 4       Isolation Precautions No active isolations  Medications Medications  vancomycin (VANCOREADY) IVPB 1250 mg/250 mL (has no administration in time range)  ceFEPIme (MAXIPIME) 2 g in sodium chloride 0.9 % 100 mL IVPB (2 g Intravenous New Bag/Given 01/20/22 1618)  morphine (PF) 4 MG/ML injection 4 mg (4 mg Intravenous Given 01/20/22 1619)    Mobility walks with person assist

## 2022-01-20 NOTE — Telephone Encounter (Signed)
     Patient  visit on 01/14/2022  at [facility]  was for fall  Have you been able to follow up with your primary care physician?  The patient was able to obtain any needed medicine or equipment.  Are there diet recommendations that you are having difficulty following?  Patient expresses understanding of discharge instructions and education provided has no other needs at this time.   [facility] [phone] [address] , [place] [address] Email : [email or web address]

## 2022-01-20 NOTE — H&P (Signed)
History and Physical    Kayla [name] ZTI:[id number] DOB: 09-01-1940 DOA: 01/20/2022  PCP: Jerline Pain, MD  Patient coming from: Home  I have personally briefly reviewed patient's old medical records available.   Chief Complaint: Left fourth toe infection  HPI: Kayla [name] is a 81 y.o. female with medical history significant of COPD and chronic hypoxemia on 3 L oxygen at home, obstructive sleep apnea on CPAP at night, sick sinus syndrome status post pacemaker, A-fib on amiodarone and metoprolol, therapeutic on Eliquis, insulin-dependent type 2 diabetes, chronic venous stasis pigmentation and lymphedema, amputation of the tip of the left great toe presents with pain and discharge from the tip of the left fourth toe.  This started after she clipped her toe about 2 weeks ago and she thinks she might have clipped some skin.  It continued to drain to the point now it is draining pus.  Denies any fever or chills.  She does have mild to moderate pain, mostly shooting pain up on her calf.  Patient does have chronic erythematous swelling of the leg.  She always have some fluid buildup.  She has some neuropathy and has chronic numbness and tingling at the base of the toes. She does have podiatry follow-up for her foot.  This time she did not make it.  She went to PCP office and they sent her to the ER. ED Course: Afebrile.  Blood pressure stable.  On 3 L oxygen.  Not in any distress.  WC count 18.9, just finishing prednisone taper.  Creatinine 1.98 at about baseline.  Clinical osteomyelitis of the tip of the toe.  X-ray with destructed toe.  Case discussed with [facility], Dr. Boneta Lucks who requested MRI and ABI.  He will probably do amputation of the toe after MRI.  Given vancomycin and cefepime in the ER.  Review of Systems: all systems are reviewed and pertinent positive as per HPI otherwise rest are negative.    Past Medical History:  Diagnosis Date   Allergy    Anxiety     Arthritis    CAD (coronary artery disease)    Mild per remote cath in 2006, /    Nuclear, January, 2013, no significant abnormality,   Carotid artery disease ([place])    Doppler, January, 2013, 0 82% RIC A., 50-53% LICA, stable   Cataract    CHF (congestive heart failure) ([place])    Chronic kidney disease    Complication of anesthesia    wakes up "mean"   COPD 02/16/2007   Intolerant of Spiriva, tudorza Intolerant of Trelegy    COPD (chronic obstructive pulmonary disease) ([place])    Coronary atherosclerosis 11/24/2008   Catheterization 2006, nonobstructive coronary disease   //   nuclear 2013, low risk     Diabetes mellitus, type 2 ([place])    Diverticula of colon    DIVERTICULOSIS OF COLON 03/23/2007   Qualifier: Diagnosis of  By: Halford Chessman MD, Vineet     Dizziness    occasional   Dizzy spells 05/04/2011   DM (diabetes mellitus) ([place]) 03/23/2007   Qualifier: Diagnosis of  By: Halford Chessman MD, Vineet     Dyspnea    On exertion   Ejection fraction    Essential hypertension 02/16/2007   Qualifier: Diagnosis of  By: Rosana Hoes CMA, Tammy     G E R D 03/23/2007   Qualifier: Diagnosis of  By: Halford Chessman MD, Vineet     Gall bladder disease 04/28/2016   Gastritis  and gastroduodenitis    GERD (gastroesophageal reflux disease)    Goiter    hx of   Headache(784.0)    migraine   Hyperlipidemia    Hypertension    Hypothyroidism    HYPOTHYROIDISM, POSTSURGICAL 06/04/2008   Qualifier: Diagnosis of  By: Loanne Drilling MD, Sean A    Left ventricular hypertrophy    Leg edema    occasional swelling   Leg swelling 12/09/2018   Myofascial pain syndrome, cervical 01/06/2018   Obesity    OBESITY 11/24/2008   Qualifier: Diagnosis of  By: Jaramillo, [place] 02/16/2007   Auto CPAP 09/03/13 to 10/02/13 >> used on 30 of 30 nights with average 6 hrs and 3 min.  Average AHI is 3.1 with median CPAP 5 cm H2O and 95 th percentile CPAP 6 cm H2O.     Occipital neuralgia of right side 01/06/2018   OSA (obstructive  sleep apnea)    cpap setting of 12   Pain in joint of right hip 01/21/2018   Palpitations 01/21/2011   48 hour Holter, 2015, scattered PACs and PVCs.    Paroxysmal atrial fibrillation (HCC)    Pneumonia    PONV (postoperative nausea and vomiting)    severe after every surgery   Pulmonary hypertension, secondary    RHINITIS 07/21/2010        RUQ abdominal pain    S/P left TKA 07/27/2011   Sinus node dysfunction (HCC) 11/08/2015   Sleep apnea    Spinal stenosis of cervical region 11/07/2014   Urinary frequency 12/09/2018   UTI (urinary tract infection) 08/15/2019   per pt report   VENTRICULAR HYPERTROPHY, LEFT 11/24/2008   Qualifier: Diagnosis of  By: Sidney Ace      Past Surgical History:  Procedure Laterality Date   ABDOMINAL HYSTERECTOMY     with appendectomy and colon polypectomy   AMPUTATION TOE Left 12/15/2019   Procedure: AMPUTATION  LEFT GREAT TOE;  Surgeon: Evelina Bucy, DPM;  Location: [facility];  Service: Podiatry;  Laterality: Left;   ANTERIOR CERVICAL DECOMP/DISCECTOMY FUSION N/A 11/07/2014   Procedure: Anterior Cervical diskectomy and fusion Cervical four-five, Cervical five-six, Cervical six-seven;  Surgeon: Kary Kos, MD;  Location: [facility];  Service: Neurosurgery;  Laterality: N/A;   APPENDECTOMY     ARTERY BIOPSY Right 02/16/2017   Procedure: BIOPSY TEMPORAL ARTERY;  Surgeon: Katha Cabal, MD;  Location: [facility];  Service: Vascular;  Laterality: Right;   ARTERY BIOPSY Left 04/16/2020   Procedure: BIOPSY TEMPORAL ARTERY;  Surgeon: Katha Cabal, MD;  Location: [facility];  Service: Vascular;  Laterality: Left;  LEFT TEMPLE   BACK SURGERY     CARDIAC CATHETERIZATION     CATARACT EXTRACTION W/ INTRAOCULAR LENS  IMPLANT, BILATERAL Bilateral    CHOLECYSTECTOMY N/A 04/29/2016   Procedure: LAPAROSCOPIC CHOLECYSTECTOMY WITH INTRAOPERATIVE CHOLANGIOGRAM;  Surgeon: Alphonsa Overall, MD;  Location: [facility];  Service: General;  Laterality: N/A;   EP  IMPLANTABLE DEVICE N/A 11/08/2015   MRI COMPATABLE -- Procedure: Pacemaker Implant;  Surgeon: Deboraha Sprang, MD;  Location: [facility];  Service: Cardiovascular;  Laterality: N/A;   INCISION AND DRAINAGE ABSCESS Right 05/30/2021   Procedure: IRRIGATION AND DEBRIDEMENT OF PREPATELLA BURSA;  Surgeon: Paralee Cancel, MD;  Location: [facility];  Service: Orthopedics;  Laterality: Right;  60 wants standard knee draping and pulse lavage   INCISION AND DRAINAGE ABSCESS Right 07/22/2021   Procedure: INCISION AND DRAINAGE ABSCESS RIGHT KNEE;  Surgeon: Paralee Cancel, MD;  Location: [facility];  Service: Orthopedics;  Laterality: Right;   INSERT / REPLACE / REMOVE PACEMAKER  2017   Dr. Jens Som [facility])   IR FLUORO GUIDE CV LINE RIGHT  07/25/2021   IR REMOVAL TUN CV CATH W/O FL  09/04/2021   IR US GUIDE VASC ACCESS RIGHT  07/25/2021   JOINT REPLACEMENT     LUMBAR DISC SURGERY  1990's   x 2   THYROIDECTOMY  2011   TOTAL KNEE ARTHROPLASTY  07/27/2011   Procedure: TOTAL KNEE ARTHROPLASTY;  Surgeon: Mauri Pole, MD;  Location: [facility];  Service: Orthopedics;  Laterality: Left;   UPPER ESOPHAGEAL ENDOSCOPIC ULTRASOUND (EUS) N/A 05/14/2016   Procedure: UPPER ESOPHAGEAL ENDOSCOPIC ULTRASOUND (EUS);  Surgeon: Milus Banister, MD;  Location: [facility];  Service: Endoscopy;  Laterality: N/A;  radial only-will be done mod if needed    Social history   reports that she quit smoking about 24 years ago. Her smoking use included cigarettes. She started smoking about 57 years ago. She has a 66.00 pack-year smoking history. She has never used smokeless tobacco. She reports that she does not drink alcohol and does not use drugs.  Allergies  Allergen Reactions   Dulaglutide Nausea Only, Other (See Comments) and Cough    Made the patient sick to her stomach (only) several days after using it   Oxycodone Itching and Other (See Comments)    Pt still takes this med even thought it causes itching   Trelegy Ellipta  [Fluticasone-Umeclidin-Vilant] Nausea Only, Other (See Comments) and Cough    Made the patient sick to her stomach (only) several days after using it     Family History  Problem Relation Age of Onset   Heart Problems Mother    Heart defect Mother        valve problems   Heart Problems Father    Heart disease Brother    Lung cancer Daughter        non small cell    Diabetes Maternal Aunt    Diabetes Maternal Uncle    Throat cancer Brother    Colon cancer Neg Hx    Colon polyps Neg Hx    Esophageal cancer Neg Hx    Rectal cancer Neg Hx    Stomach cancer Neg Hx      Prior to Admission medications   Medication Sig Start Date End Date Taking? Authorizing Provider  acetaminophen (TYLENOL) 500 MG tablet Take 1,000-1,500 mg by mouth every 8 (eight) hours as needed for mild pain, moderate pain or headache.    [provider]  albuterol (PROVENTIL) (2.5 MG/3ML) 0.083% nebulizer solution Take 3 mLs (2.5 mg total) by nebulization every 2 (two) hours as needed for wheezing or shortness of breath. Patient taking differently: Take 2.5 mg by nebulization in the morning, at noon, and at bedtime. 05/15/20   Chesley Mires, MD  albuterol (VENTOLIN HFA) 108 (90 Base) MCG/ACT inhaler INHALE 2 PUFFS INTO THE LUNGS EVERY 6 HOURS AS NEEDED FOR WHEEZING OR SHORTNESS OF BREATH 08/11/21   Tanda Rockers, MD  Alcohol Swabs (B-D SINGLE USE SWABS REGULAR) PADS  11/24/19   [provider]  allopurinol (ZYLOPRIM) 100 MG tablet Take 200 mg by mouth daily. 04/16/19   [provider]  amiodarone (PACERONE) 200 MG tablet Take 1 tablet (200 mg total) by mouth daily. 08/07/21   Jerline Pain, MD  amLODipine (NORVASC) 5 MG tablet Take 1 tablet (5 mg total)  by mouth daily. 03/10/21   Jerline Pain, MD  apixaban (ELIQUIS) 2.5 MG TABS tablet Take 1 tablet (2.5 mg total) by mouth 2 (two) times daily. 11/10/21   Jerline Pain, MD  ascorbic acid (VITAMIN C) 500 MG tablet Take 500 mg by mouth daily with  lunch.    [provider]  Blood Glucose Calibration (TRUE METRIX LEVEL 1) Low SOLN  11/27/19   [provider]  BREZTRI AEROSPHERE 160-9-4.8 MCG/ACT AERO INHALE 2 PUFFS INTO THE LUNGS IN THE MORNING AND AT BEDTIME. 01/15/21   Chesley Mires, MD  Carboxymethylcellulose Sodium (ARTIFICIAL TEARS OP) Place 1 drop into both eyes 3 (three) times daily as needed (for dryness).    [provider]  Chlorpheniramine Maleate (ALLERGY RELIEF PO) Take 1 tablet by mouth daily as needed (allergies).    [provider]  ferrous sulfate 325 (65 FE) MG tablet Take 1 tablet (325 mg total) by mouth daily with breakfast. 01/26/20   Deboraha Sprang, MD  fluticasone (FLONASE) 50 MCG/ACT nasal spray USE 2 SPRAYS NASALLY DAILY Patient taking differently: Place 1 spray into both nostrils 2 (two) times daily. 02/26/17   Collene Gobble, MD  gabapentin (NEURONTIN) 300 MG capsule Take 1 capsule (300 mg total) by mouth 3 (three) times daily. 07/27/21   Regalado, Belkys A, MD  guaiFENesin (MUCINEX) 600 MG 12 hr tablet Take 2 tablets (1,200 mg total) by mouth 2 (two) times daily as needed for to loosen phlegm or cough. 01/12/22   Chesley Mires, MD  HYDROcodone-acetaminophen (NORCO) 10-325 MG tablet Take 1 tablet by mouth 3 (three) times daily as needed for moderate pain.    [provider]  insulin aspart (NOVOLOG) 100 UNIT/ML FlexPen Inject 10-17 Units into the skin 3 (three) times daily with meals.    [provider]  insulin glargine (LANTUS) 100 UNIT/ML injection Inject 0.55 mLs (55 Units total) into the skin 2 (two) times daily. Patient taking differently: Inject 50 Units into the skin 2 (two) times daily. 07/27/21   Regalado, Belkys A, MD  levothyroxine (SYNTHROID) 200 MCG tablet Take 200 mcg by mouth daily before breakfast.    [provider]  metoprolol succinate (TOPROL-XL) 100 MG 24 hr tablet TAKE 1 TABLET EVERY DAY WITH OR IMMEDIATELY AFTER A MEAL 11/14/20   Deboraha Sprang, MD  naloxone [facility]) nasal spray 4 mg/0.1 mL Place 4 mg into the nose once as needed for opioid reversal. 12/04/20   [provider]  nitroGLYCERIN (NITROSTAT) 0.4 MG SL tablet Place 1 tablet (0.4 mg total) under the tongue every 5 (five) minutes as needed for chest pain. 04/13/19   Jerline Pain, MD  predniSONE (DELTASONE) 20 MG tablet Take 2 tablets (40 mg total) by mouth daily with breakfast. For the next four days 01/14/22   Carmin Muskrat, MD  PRESCRIPTION MEDICATION CPAP- At bedtime and as needed during any time of rest (if shortness of breath is present)    [provider]  torsemide (DEMADEX) 20 MG tablet TAKE 3 TABLETS (60 MG TOTAL) BY MOUTH EVERY OTHER DAY. 11/06/21   Jerline Pain, MD  TRUE METRIX BLOOD GLUCOSE TEST test strip  09/18/17   [provider]  TRUEplus Lancets 28G [place]  11/24/19   [provider]  vitamin B-12 (CYANOCOBALAMIN) 1000 MCG tablet Take 1,000 mcg by mouth daily.    [provider]  Vitamin D3 (VITAMIN D) 25 MCG tablet Take 1,000 Units by mouth every morning.  [provider]    Physical Exam: Vitals:   01/20/22 1424 01/20/22 1425 01/20/22 1530  BP: (!) 128/56  (!) 137/97  Pulse: 64  62  Resp: 18  17  Temp: 99 F (37.2 C)    TempSrc: Oral    SpO2: 95%  99%  Weight:  112.5 kg   Height:  '5\' 5"'$  (1.651 m)     Constitutional: NAD, calm, comfortable.  Talkative. Vitals:   01/20/22 1424 01/20/22 1425 01/20/22 1530  BP: (!) 128/56  (!) 137/97  Pulse: 64  62  Resp: 18  17  Temp: 99 F (37.2 C)    TempSrc: Oral    SpO2: 95%  99%  Weight:  112.5 kg   Height:  '5\' 5"'$  (1.651 m)    Eyes: PERRL, lids and conjunctivae normal ENMT: Mucous membranes are moist. Posterior pharynx clear of any exudate or lesions.Normal dentition.  Neck: normal, supple, no masses, no thyromegaly Respiratory: clear to auscultation bilaterally, no wheezing, no crackles. Normal respiratory effort. No accessory muscle use.   On 3 L oxygen. Cardiovascular: Regular rate and rhythm, no murmurs / rubs / gallops. 2+ pedal pulses. No carotid bruits.  Patient has pacemaker on left precordium. Abdomen: no tenderness, no masses palpated. No hepatosplenomegaly. Bowel sounds positive.  Musculoskeletal: no clubbing / cyanosis. No joint deformity upper and lower extremities. Good ROM, no contractures. Normal muscle tone.  Neurologic: CN 2-12 grossly intact. Sensation intact, DTR normal. Strength 5/5 in all 4.  Psychiatric: Normal judgment and insight. Alert and oriented x 3. Normal mood.  Both lower extremities with chronic induration, lymphatic swelling with indurated skin. Left fourth toe tip with obvious exposed bone with yellow slough. She does have palpable dorsalis pedis.   Labs on Admission: I have personally reviewed following labs and imaging studies  CBC: Recent Labs  Lab 01/20/22 1532  WBC 18.9*  NEUTROABS 17.8*  HGB 9.5*  HCT 30.7*  MCV 97.5  PLT 485   Basic Metabolic Panel: Recent Labs  Lab 01/20/22 1532  NA 140  K 4.8  CL 107  CO2 24  GLUCOSE 184*  BUN 65*  CREATININE 1.98*  CALCIUM 8.4*   GFR: Estimated Creatinine Clearance: 28.3 mL/min (A) (by C-G formula based on SCr of 1.98 mg/dL (H)). Liver Function Tests: Recent Labs  Lab 01/20/22 1532  AST 19  ALT 24  ALKPHOS 59  BILITOT 0.3  PROT 6.3*  ALBUMIN 2.4*   No results for input(s): "LIPASE", "AMYLASE" in the last 168 hours. No results for input(s): "AMMONIA" in the last 168 hours. Coagulation Profile: No results for input(s): "INR", "PROTIME" in the last 168 hours. Cardiac Enzymes: No results for input(s): "CKTOTAL", "CKMB", "CKMBINDEX", "TROPONINI" in the last 168 hours. BNP (last 3 results) No results for input(s): "PROBNP" in the last 8760 hours. HbA1C: No results for input(s): "HGBA1C" in the last 72 hours. CBG: No results for input(s): "GLUCAP" in the last 168 hours. Lipid Profile: No results for input(s): "CHOL",  "HDL", "LDLCALC", "TRIG", "CHOLHDL", "LDLDIRECT" in the last 72 hours. Thyroid Function Tests: No results for input(s): "TSH", "T4TOTAL", "FREET4", "T3FREE", "THYROIDAB" in the last 72 hours. Anemia Panel: No results for input(s): "VITAMINB12", "FOLATE", "FERRITIN", "TIBC", "IRON", "RETICCTPCT" in the last 72 hours. Urine analysis:    Component Value Date/Time   COLORURINE YELLOW 07/22/2021 0011   APPEARANCEUR CLOUDY (A) 07/22/2021 0011   LABSPEC 1.023 07/22/2021 0011   PHURINE 5.0 07/22/2021 0011   GLUCOSEU 50 (A) 07/22/2021 0011   GLUCOSEU  NEGATIVE 12/09/2018 0944   HGBUR NEGATIVE 07/22/2021 0011   BILIRUBINUR NEGATIVE 07/22/2021 0011   KETONESUR NEGATIVE 07/22/2021 0011   PROTEINUR >=300 (A) 07/22/2021 0011   UROBILINOGEN 0.2 12/09/2018 0944   NITRITE NEGATIVE 07/22/2021 0011   LEUKOCYTESUR NEGATIVE 07/22/2021 0011    Radiological Exams on Admission: DG Foot Complete Left  Result Date: 01/20/2022 CLINICAL DATA:  Left great toe infection. EXAM: LEFT FOOT - COMPLETE 3+ VIEW COMPARISON:  Left foot radiographs 12/15/2019 FINDINGS: Redemonstration of amputation of the distal phalanx of the great toe. Interval removal of prior surgical skin staples. New acute to subacute fracture of the medial and lateral aspects of the base of the proximal phalanx of the second toe with 3 mm cortical step-off of the distal portion of the lateral fracture component and intra-articular extension of both the medial and lateral fractures. There is moderate to high-grade fourth toe soft tissue swelling with irregularity at the distal aspect suggesting a wound. There is distal subcutaneous air. There is moderate erosion and fragmentation of the majority of the distal phalanx of the fourth toe, new from prior. This is concerning for acute osteomyelitis. Redemonstration of 5 by less than 1 mm metallic density overlying the medial fifth toe soft tissues, likely a chronic foreign body. Moderate plantar calcaneal heel  spur. Mild-to-moderate great toe metatarsophalangeal joint space narrowing and peripheral osteophytosis with mild hallux valgus. IMPRESSION: 1. Moderate to high-grade fourth toe soft tissue swelling with distal soft tissue wound and subcutaneous air. Moderate erosion and fragmentation of the majority of the distal phalanx of fourth toe concerning for acute osteomyelitis. 2. New acute to subacute fracture of the medial and lateral aspects of the base of the proximal phalanx of the second toe. 3. Prior amputation of the distal phalanx of the great toe. 4. Unchanged linear metallic foreign body overlying the medial fifth toe. Electronically Signed   By: Yvonne Kendall M.D.   On: 01/20/2022 15:24     Assessment/Plan Principal Problem:   Toe osteomyelitis, left (HCC) Active Problems:   Insulin dependent type 2 diabetes mellitus ([place])   Hypertension associated with diabetes ([place])   Paroxysmal atrial fibrillation (HCC)   HYPOTHYROIDISM, POSTSURGICAL   Hyperlipidemia associated with type 2 diabetes mellitus (HCC)   OBSTRUCTIVE SLEEP APNEA   COPD (chronic obstructive pulmonary disease) (HCC)   Chronic kidney disease, stage 3b (HCC)   Leg swelling   Polymyalgia rheumatica ([place])     1.  Diabetic toe ulcer, abscess and osteomyelitis of the left fourth toe: Leukocytosis likely secondary to steroid use. No evidence of sepsis. Admit, diabetic diet, n.p.o. past midnight, MRI of the left foot, ABI. Patient will probably need left fourth toe amputation, podiatry will be following. Patient is not obviously septic, however until cultures are available we will continue vancomycin and cefepime along with Flagyl.  Anticipate surgical cultures.  Anticipate amputation to clean margin so that patient does not need long-term antibiotics that will be difficult to manage in a patient with CKD stage IV and multiple medical issues. Pain management with Tylenol and oxycodone.  2.  Type 2 diabetes, poorly controlled on  insulin: Diabetic neuropathy. Patient on large dose of insulin at home.  Going to be n.p.o. past midnight.  We will keep on half dose of long-acting insulin and continue sliding scale insulin. Diabetic educator consult. On gabapentin.  3.  COPD with chronic hypoxemia on 3 L oxygen, obstructive sleep apnea on CPAP at night: Fairly stable.  She will continue bronchodilator therapy and  D[place]era.  She will use CPAP at night.  4.  Sick sinus syndrome status post pacemaker, chronic A-fib: Rate controlled.  On amiodarone and metoprolol that will be continued. We will hold Eliquis today.  Hopefully can resume after surgery tomorrow.  5. Chronic medical issues Postoperative hypothyroidism Essential hypertension  Stable.  Resume all home medications.  DVT prophylaxis: Hold chemical anticoagulation for potential surgery.  Patient has bilateral lymphedema and cannot tolerate SCDs.  Resume Eliquis after procedure. Code Status: Full code Family Communication: Husband at the bedside Disposition Plan: Home Consults called: [facility], Dr. Posey Pronto Admission status: Inpatient.  MedSurg.   Barb Merino MD [facility] Pager [phone]

## 2022-01-20 NOTE — Progress Notes (Signed)
Pharmacy Antibiotic Note  Kayla [name] is a 81 y.o. female admitted on 01/20/2022 with suspected diabetic foot infection.  Pharmacy has been consulted for Cefepime and Vancomycin dosing.  Plan: Cefepime 2g IV q24 hrs Vancomycin 2000 mg IV q48h (SCr 1.98, Vd 0.9, Est AUC 463) Measure Vanc levels as needed.  Goal AUC = 400 - 550 Follow up renal function, culture results, and clinical course.   Height: '5\' 5"'$  (165.1 cm) Weight: 112.5 kg (248 lb) IBW/kg (Calculated) : 57  Temp (24hrs), Avg:99 F (37.2 C), Min:99 F (37.2 C), Max:99 F (37.2 C)  Recent Labs  Lab 01/20/22 1446 01/20/22 1532  WBC  --  18.9*  CREATININE  --  1.98*  LATICACIDVEN 1.4  --     Estimated Creatinine Clearance: 28.3 mL/min (A) (by C-G formula based on SCr of 1.98 mg/dL (H)).    Allergies  Allergen Reactions   Dulaglutide Nausea Only, Other (See Comments) and Cough    Made the patient sick to her stomach (only) several days after using it   Oxycodone Itching and Other (See Comments)    Pt still takes this med even thought it causes itching   Trelegy Ellipta [Fluticasone-Umeclidin-Vilant] Nausea Only, Other (See Comments) and Cough    Made the patient sick to her stomach (only) several days after using it     Antimicrobials this admission: 9/19 Cefepime >>  9/19 Vancomycin >>  Dose adjustments this admission:   Microbiology results: 9/19 BCx:   Thank you for allowing pharmacy to be a part of this patient's care.  Gretta Arab PharmD, BCPS Clinical Pharmacist [facility] main pharmacy [phone] 01/20/2022 5:18 PM

## 2022-01-20 NOTE — ED Provider Notes (Signed)
Early DEPT Provider Note   CSN: [id number] Arrival date & time: 01/20/22  1417     History  Chief Complaint  Patient presents with   Nail Problem    Kayla [name] is a 81 y.o. female.  With PMH of COPD on 3 L nasal cannula at baseline, CAD, CHF, insulin-dependent diabetes who presents with worsening pain and discharge from her left fourth toe and was sent here by her PCP who was concerned for infection.  She said she was clipping her toenails a couple of weeks ago and missed and cut the end of her toe.  Since then she has developed increasing redness, pain and drainage from that site.  She is unsure if she has had fevers or chills at home.  She is not currently on any antibiotics but was recently on a course of steroids for a COPD exacerbation.  She endorses chronic numbness and tingling at the base of her toes and feet from diabetic neuropathy.  HPI     Home Medications Prior to Admission medications   Medication Sig Start Date End Date Taking? Authorizing Provider  acetaminophen (TYLENOL) 500 MG tablet Take 1,000-1,500 mg by mouth every 8 (eight) hours as needed for mild pain, moderate pain or headache.    [provider]  albuterol (PROVENTIL) (2.5 MG/3ML) 0.083% nebulizer solution Take 3 mLs (2.5 mg total) by nebulization every 2 (two) hours as needed for wheezing or shortness of breath. Patient taking differently: Take 2.5 mg by nebulization in the morning, at noon, and at bedtime. 05/15/20   Chesley Mires, MD  albuterol (VENTOLIN HFA) 108 (90 Base) MCG/ACT inhaler INHALE 2 PUFFS INTO THE LUNGS EVERY 6 HOURS AS NEEDED FOR WHEEZING OR SHORTNESS OF BREATH 08/11/21   Tanda Rockers, MD  Alcohol Swabs (B-D SINGLE USE SWABS REGULAR) PADS  11/24/19   [provider]  allopurinol (ZYLOPRIM) 100 MG tablet Take 200 mg by mouth daily. 04/16/19   [provider]  amiodarone (PACERONE) 200 MG tablet Take 1 tablet (200 mg total) by  mouth daily. 08/07/21   Jerline Pain, MD  amLODipine (NORVASC) 5 MG tablet Take 1 tablet (5 mg total) by mouth daily. 03/10/21   Jerline Pain, MD  apixaban (ELIQUIS) 2.5 MG TABS tablet Take 1 tablet (2.5 mg total) by mouth 2 (two) times daily. 11/10/21   Jerline Pain, MD  ascorbic acid (VITAMIN C) 500 MG tablet Take 500 mg by mouth daily with lunch.    [provider]  Blood Glucose Calibration (TRUE METRIX LEVEL 1) Low SOLN  11/27/19   [provider]  BREZTRI AEROSPHERE 160-9-4.8 MCG/ACT AERO INHALE 2 PUFFS INTO THE LUNGS IN THE MORNING AND AT BEDTIME. 01/15/21   Chesley Mires, MD  Carboxymethylcellulose Sodium (ARTIFICIAL TEARS OP) Place 1 drop into both eyes 3 (three) times daily as needed (for dryness).    [provider]  Chlorpheniramine Maleate (ALLERGY RELIEF PO) Take 1 tablet by mouth daily as needed (allergies).    [provider]  ferrous sulfate 325 (65 FE) MG tablet Take 1 tablet (325 mg total) by mouth daily with breakfast. 01/26/20   Deboraha Sprang, MD  fluticasone (FLONASE) 50 MCG/ACT nasal spray USE 2 SPRAYS NASALLY DAILY Patient taking differently: Place 1 spray into both nostrils 2 (two) times daily. 02/26/17   Collene Gobble, MD  gabapentin (NEURONTIN) 300 MG capsule Take 1 capsule (300 mg total) by mouth 3 (three) times daily. 07/27/21  Regalado, Belkys A, MD  guaiFENesin (MUCINEX) 600 MG 12 hr tablet Take 2 tablets (1,200 mg total) by mouth 2 (two) times daily as needed for to loosen phlegm or cough. 01/12/22   Chesley Mires, MD  HYDROcodone-acetaminophen (NORCO) 10-325 MG tablet Take 1 tablet by mouth 3 (three) times daily as needed for moderate pain.    [provider]  insulin aspart (NOVOLOG) 100 UNIT/ML FlexPen Inject 10-17 Units into the skin 3 (three) times daily with meals.    [provider]  insulin glargine (LANTUS) 100 UNIT/ML injection Inject 0.55 mLs (55 Units total) into the skin 2 (two) times daily. Patient  taking differently: Inject 50 Units into the skin 2 (two) times daily. 07/27/21   Regalado, Belkys A, MD  levothyroxine (SYNTHROID) 200 MCG tablet Take 200 mcg by mouth daily before breakfast.    [provider]  metoprolol succinate (TOPROL-XL) 100 MG 24 hr tablet TAKE 1 TABLET EVERY DAY WITH OR IMMEDIATELY AFTER A MEAL 11/14/20   Deboraha Sprang, MD  naloxone [facility]) nasal spray 4 mg/0.1 mL Place 4 mg into the nose once as needed for opioid reversal. 12/04/20   [provider]  nitroGLYCERIN (NITROSTAT) 0.4 MG SL tablet Place 1 tablet (0.4 mg total) under the tongue every 5 (five) minutes as needed for chest pain. 04/13/19   Jerline Pain, MD  predniSONE (DELTASONE) 20 MG tablet Take 2 tablets (40 mg total) by mouth daily with breakfast. For the next four days 01/14/22   Carmin Muskrat, MD  PRESCRIPTION MEDICATION CPAP- At bedtime and as needed during any time of rest (if shortness of breath is present)    [provider]  torsemide (DEMADEX) 20 MG tablet TAKE 3 TABLETS (60 MG TOTAL) BY MOUTH EVERY OTHER DAY. 11/06/21   Jerline Pain, MD  TRUE METRIX BLOOD GLUCOSE TEST test strip  09/18/17   [provider]  TRUEplus Lancets 28G [place]  11/24/19   [provider]  vitamin B-12 (CYANOCOBALAMIN) 1000 MCG tablet Take 1,000 mcg by mouth daily.    [provider]  Vitamin D3 (VITAMIN D) 25 MCG tablet Take 1,000 Units by mouth every morning.    [provider]      Allergies    Dulaglutide, Oxycodone, and Trelegy ellipta [fluticasone-umeclidin-vilant]    Review of Systems   Review of Systems  Physical Exam Updated Vital Signs BP (!) 137/97   Pulse 62   Temp 99 F (37.2 C) (Oral)   Resp 17   Ht '5\' 5"'$  (1.651 m)   Wt 112.5 kg   SpO2 99%   BMI 41.27 kg/m  Physical Exam Constitutional: Alert and oriented.  Nontoxic, no acute distress  eyes: Conjunctivae are normal. ENT      Head: Normocephalic and atraumatic.      Nose: No  congestion.      Mouth/Throat: Mucous membranes are moist.      Neck: No stridor. Cardiovascular: S1, S2, extremities are warm and dry, capillary refill of toes of left foot less than 2 seconds Respiratory: Normal respiratory effort.  Nasal cannula in place satting 99% on 3 L Gastrointestinal: Soft  Musculoskeletal: Normal range of motion in all extremities. Nontender pitting edema throughout bilateral lower extremities with erythema and skin breakdown with no active discharge of the left distal lower extremity as well as purulent foul-smelling discharge and exposure of underlying bone of the left fourth toe with surrounding erythema as can be seen in media Neurologic: Normal speech  and language.  Decreased sensation of the toes of bilateral feet.  full strength of bilateral lower extremities intact  skin: See media for images of left foot Psychiatric: Mood and affect are normal. Speech and behavior are normal.        ED Results / Procedures / Treatments   Labs (all labs ordered are listed, but only abnormal results are displayed) Labs Reviewed  COMPREHENSIVE METABOLIC PANEL - Abnormal; Notable for the following components:      Result Value   Glucose, Bld 184 (*)    BUN 65 (*)    Creatinine, Ser 1.98 (*)    Calcium 8.4 (*)    Total Protein 6.3 (*)    Albumin 2.4 (*)    GFR, Estimated 25 (*)    All other components within normal limits  CBC WITH DIFFERENTIAL/PLATELET - Abnormal; Notable for the following components:   WBC 18.9 (*)    RBC 3.15 (*)    Hemoglobin 9.5 (*)    HCT 30.7 (*)    RDW 18.4 (*)    Neutro Abs 17.8 (*)    Lymphs Abs 0.6 (*)    Abs Immature Granulocytes 0.18 (*)    All other components within normal limits  CULTURE, BLOOD (ROUTINE X 2)  CULTURE, BLOOD (ROUTINE X 2)  LACTIC ACID, PLASMA  SEDIMENTATION RATE  C-REACTIVE PROTEIN    EKG None  Radiology DG Foot Complete Left  Result Date: 01/20/2022 CLINICAL DATA:  Left great toe infection. EXAM:  LEFT FOOT - COMPLETE 3+ VIEW COMPARISON:  Left foot radiographs 12/15/2019 FINDINGS: Redemonstration of amputation of the distal phalanx of the great toe. Interval removal of prior surgical skin staples. New acute to subacute fracture of the medial and lateral aspects of the base of the proximal phalanx of the second toe with 3 mm cortical step-off of the distal portion of the lateral fracture component and intra-articular extension of both the medial and lateral fractures. There is moderate to high-grade fourth toe soft tissue swelling with irregularity at the distal aspect suggesting a wound. There is distal subcutaneous air. There is moderate erosion and fragmentation of the majority of the distal phalanx of the fourth toe, new from prior. This is concerning for acute osteomyelitis. Redemonstration of 5 by less than 1 mm metallic density overlying the medial fifth toe soft tissues, likely a chronic foreign body. Moderate plantar calcaneal heel spur. Mild-to-moderate great toe metatarsophalangeal joint space narrowing and peripheral osteophytosis with mild hallux valgus. IMPRESSION: 1. Moderate to high-grade fourth toe soft tissue swelling with distal soft tissue wound and subcutaneous air. Moderate erosion and fragmentation of the majority of the distal phalanx of fourth toe concerning for acute osteomyelitis. 2. New acute to subacute fracture of the medial and lateral aspects of the base of the proximal phalanx of the second toe. 3. Prior amputation of the distal phalanx of the great toe. 4. Unchanged linear metallic foreign body overlying the medial fifth toe. Electronically Signed   By: Yvonne Kendall M.D.   On: 01/20/2022 15:24    Procedures Procedures  Remain on constant cardiac monitoring, regular rate. Medications Ordered in ED Medications  ceFEPIme (MAXIPIME) 2 g in sodium chloride 0.9 % 100 mL IVPB (2 g Intravenous New Bag/Given 01/20/22 1618)  vancomycin (VANCOREADY) IVPB 1250 mg/250 mL (has no  administration in time range)  morphine (PF) 4 MG/ML injection 4 mg (4 mg Intravenous Given 01/20/22 1619)    ED Course/ Medical Decision Making/ A&P Clinical Course as of 01/20/22  [place] Jan 20, 2022  1629 Discussed case with hospitalist who will put in orders for admission. [VB]    Clinical Course User Index [VB] Elgie Congo, MD                           Medical Decision Making DALISSA LOVIN is a 81 y.o. female.  With PMH of COPD on 3 L nasal cannula at baseline, CAD, CHF, insulin-dependent diabetes who presents with worsening pain and discharge from her left fourth toe and was sent here by her PCP who was concerned for infection.    Patient's presentation is concerning for diabetic foot infection with possible associated underlying osteomyelitis.  An x-ray was obtained which showed changes suggestive of osteomyelitis.  She is not febrile, not hypotensive not tachycardic and not tachypneic, not concern for sepsis or septic shock.  However will cover with vancomycin and cefepime due to poorly controlled diabetes and previous wound infections.  We will plan to reach out to podiatry for patient consult likely requiring amputation.   Spoke to Dr. Boneta Lucks of podiatry who request MRI of left lower extremity as well as ABIs which I put in orders for.  He will put her on the list for plans for likely amputation.  Her labs are remarkable for leukocytosis 18.9 with left shift likely secondary to underlying infection as well as previous steroid use.  Creatinine 1.98 uptrending from prior with elevated BUN 65, BUN/Cr 32. Lactate wnl.   Will reach out to hospitalist for admission for continued management of diabetic foot infection likely suspected associated osteomyelitis.  Amount and/or Complexity of Data Reviewed Labs: ordered. Radiology: ordered.  Risk Prescription drug management. Decision regarding hospitalization.    Final Clinical Impression(s) / ED Diagnoses Final  diagnoses:  Diabetic foot infection ([place])  Toe osteomyelitis ([place])    Rx / DC Orders ED Discharge Orders     None         Elgie Congo, MD 01/20/22 1630

## 2022-01-20 NOTE — Plan of Care (Signed)

## 2022-01-20 NOTE — ED Triage Notes (Addendum)
Patient reports that she went to see her PCP today and was sent to the ED for a left great toe infection. PCP told the patient that the toe had an odor.  Patient is on continuous oxygen at 3L/min via [place].

## 2022-01-20 NOTE — Progress Notes (Signed)
A consult was received from an ED physician for vancomycin per pharmacy dosing (for an indication other than meningitis). The patient's profile has been reviewed for ht/wt/allergies/indication/available labs. A one time order has been placed for the above antibiotics.  Further antibiotics/pharmacy consults should be ordered by admitting physician if indicated.                       Reuel Boom, PharmD, BCPS [phone] 01/20/2022, 3:42 PM

## 2022-01-21 ENCOUNTER — Inpatient Hospital Stay (HOSPITAL_COMMUNITY): Payer: Medicare HMO

## 2022-01-21 ENCOUNTER — Inpatient Hospital Stay (HOSPITAL_BASED_OUTPATIENT_CLINIC_OR_DEPARTMENT_OTHER): Payer: Medicare HMO

## 2022-01-21 DIAGNOSIS — L0390 Cellulitis, unspecified: Secondary | ICD-10-CM

## 2022-01-21 DIAGNOSIS — M86672 Other chronic osteomyelitis, left ankle and foot: Secondary | ICD-10-CM | POA: Diagnosis not present

## 2022-01-21 DIAGNOSIS — L089 Local infection of the skin and subcutaneous tissue, unspecified: Secondary | ICD-10-CM

## 2022-01-21 DIAGNOSIS — Z794 Long term (current) use of insulin: Secondary | ICD-10-CM

## 2022-01-21 DIAGNOSIS — N1832 Chronic kidney disease, stage 3b: Secondary | ICD-10-CM

## 2022-01-21 DIAGNOSIS — E1159 Type 2 diabetes mellitus with other circulatory complications: Secondary | ICD-10-CM

## 2022-01-21 DIAGNOSIS — E119 Type 2 diabetes mellitus without complications: Secondary | ICD-10-CM

## 2022-01-21 DIAGNOSIS — E1169 Type 2 diabetes mellitus with other specified complication: Secondary | ICD-10-CM | POA: Diagnosis not present

## 2022-01-21 DIAGNOSIS — E11628 Type 2 diabetes mellitus with other skin complications: Secondary | ICD-10-CM

## 2022-01-21 DIAGNOSIS — I480 Paroxysmal atrial fibrillation: Secondary | ICD-10-CM

## 2022-01-21 DIAGNOSIS — E785 Hyperlipidemia, unspecified: Secondary | ICD-10-CM

## 2022-01-21 DIAGNOSIS — M869 Osteomyelitis, unspecified: Secondary | ICD-10-CM | POA: Diagnosis not present

## 2022-01-21 DIAGNOSIS — I152 Hypertension secondary to endocrine disorders: Secondary | ICD-10-CM

## 2022-01-21 LAB — CBC
HCT: 29.5 % — ABNORMAL LOW (ref 36.0–46.0)
Hemoglobin: 8.8 g/dL — ABNORMAL LOW (ref 12.0–15.0)
MCH: 28.9 pg (ref 26.0–34.0)
MCHC: 29.8 g/dL — ABNORMAL LOW (ref 30.0–36.0)
MCV: 97 fL (ref 80.0–100.0)
Platelets: 357 10*3/uL (ref 150–400)
RBC: 3.04 MIL/uL — ABNORMAL LOW (ref 3.87–5.11)
RDW: 18.3 % — ABNORMAL HIGH (ref 11.5–15.5)
WBC: 16.8 10*3/uL — ABNORMAL HIGH (ref 4.0–10.5)
nRBC: 0 % (ref 0.0–0.2)

## 2022-01-21 LAB — BASIC METABOLIC PANEL
Anion gap: 9 (ref 5–15)
BUN: 66 mg/dL — ABNORMAL HIGH (ref 8–23)
CO2: 26 mmol/L (ref 22–32)
Calcium: 8.5 mg/dL — ABNORMAL LOW (ref 8.9–10.3)
Chloride: 108 mmol/L (ref 98–111)
Creatinine, Ser: 2.12 mg/dL — ABNORMAL HIGH (ref 0.44–1.00)
GFR, Estimated: 23 mL/min — ABNORMAL LOW (ref >60)
Glucose, Bld: 165 mg/dL — ABNORMAL HIGH (ref 70–99)
Potassium: 4.9 mmol/L (ref 3.5–5.1)
Sodium: 143 mmol/L (ref 135–145)

## 2022-01-21 LAB — MRSA NEXT GEN BY PCR, NASAL: MRSA by PCR Next Gen: DETECTED — AB

## 2022-01-21 LAB — GLUCOSE, CAPILLARY
Glucose-Capillary: 172 mg/dL — ABNORMAL HIGH (ref 70–99)
Glucose-Capillary: 186 mg/dL — ABNORMAL HIGH (ref 70–99)
Glucose-Capillary: 131 mg/dL — ABNORMAL HIGH (ref 70–99)
Glucose-Capillary: 168 mg/dL — ABNORMAL HIGH (ref 70–99)

## 2022-01-21 MED ORDER — MUPIROCIN 2 % EX OINT: TOPICAL_OINTMENT | Freq: Two times a day (BID) | CUTANEOUS | Status: DC

## 2022-01-21 MED ORDER — FUROSEMIDE 10 MG/ML IJ SOLN
20.0000 mg | Freq: Once | INTRAMUSCULAR | Status: AC
  Filled 2022-01-21: qty 2

## 2022-01-21 MED ORDER — CHLORHEXIDINE GLUCONATE CLOTH 2 % EX PADS
6.0000 | MEDICATED_PAD | Freq: Every day | CUTANEOUS | Status: DC
  Administered 2022-01-21 – 2022-01-24 (×4): 6 via TOPICAL

## 2022-01-21 MED ORDER — MUPIROCIN 2 % EX OINT
1.0000 | TOPICAL_OINTMENT | Freq: Two times a day (BID) | CUTANEOUS | Status: DC
  Administered 2022-01-21 – 2022-01-25 (×8): 1 via NASAL
  Filled 2022-01-21: qty 22

## 2022-01-21 MED ORDER — FUROSEMIDE 10 MG/ML IJ SOLN
60.0000 mg | Freq: Two times a day (BID) | INTRAMUSCULAR | Status: DC
  Administered 2022-01-21 – 2022-01-25 (×8): 60 mg via INTRAVENOUS
  Filled 2022-01-21 (×8): qty 6

## 2022-01-21 MED ORDER — AMLODIPINE BESYLATE 10 MG PO TABS
10.0000 mg | ORAL_TABLET | Freq: Every day | ORAL | Status: DC
  Administered 2022-01-22 – 2022-01-25 (×4): 10 mg via ORAL
  Filled 2022-01-21 (×4): qty 1

## 2022-01-21 MED ORDER — ALBUTEROL SULFATE (2.5 MG/3ML) 0.083% IN NEBU
2.5000 mg | INHALATION_SOLUTION | RESPIRATORY_TRACT | Status: DC | PRN
  Administered 2022-01-21 – 2022-01-22 (×3): 2.5 mg via RESPIRATORY_TRACT
  Filled 2022-01-21 (×3): qty 3

## 2022-01-21 MED ORDER — ORAL CARE MOUTH RINSE: 15.0000 mL | OROMUCOSAL | Status: DC | PRN

## 2022-01-21 MED ORDER — VANCOMYCIN HCL IN DEXTROSE 1-5 GM/200ML-% IV SOLN
1000.0000 mg | INTRAVENOUS | Status: DC
  Administered 2022-01-22: 1000 mg via INTRAVENOUS
  Filled 2022-01-21: qty 200

## 2022-01-21 MED ORDER — ATORVASTATIN CALCIUM 40 MG PO TABS
40.0000 mg | ORAL_TABLET | Freq: Every day | ORAL | Status: DC
  Administered 2022-01-21 – 2022-01-25 (×5): 40 mg via ORAL
  Filled 2022-01-21 (×5): qty 1

## 2022-01-21 NOTE — Progress Notes (Signed)
Pharmacy Antibiotic Note  Kayla [name] is a 81 y.o. female admitted on 01/20/2022 with suspected diabetic foot infection. Pharmacy has been consulted for Cefepime and Vancomycin dosing.  Plan: -SCr trending up -Change vancomycin to 1 g IV q48h -Continue cefepime 2 g IV q24h -Pharmacy to continue to follow renal function, cultures and clinical progress for dose adjustments and de-escalation as indicated   Height: '5\' 5"'$  (165.1 cm) Weight: 112.5 kg (248 lb) IBW/kg (Calculated) : 57  Temp (24hrs), Avg:98.3 F (36.8 C), Min:97.8 F (36.6 C), Max:99 F (37.2 C)  Recent Labs  Lab 01/20/22 1446 01/20/22 1532 01/21/22 0343  WBC  --  18.9* 16.8*  CREATININE  --  1.98* 2.12*  LATICACIDVEN 1.4  --   --      Estimated Creatinine Clearance: 26.5 mL/min (A) (by C-G formula based on SCr of 2.12 mg/dL (H)).    Allergies  Allergen Reactions   Dulaglutide Nausea Only, Other (See Comments) and Cough    TRULICITY made the patient sick to her stomach (only) several days after using it   Oxycodone Itching and Other (See Comments)    Pt still takes this med even thought it causes itching   Trelegy Ellipta [Fluticasone-Umeclidin-Vilant] Nausea And Vomiting, Other (See Comments) and Cough    Made the patient sick to her stomach (only) several days after using it     Antimicrobials this admission: 9/19 Cefepime >>  9/19 Vancomycin >> 9/19 Flagyl >>  Dose adjustments this admission: 9/20 Vanc 2 g q48h >> 1 g q48h  Microbiology results: 9/19 BCx:  9/19 MRSA PCR: positive  Thank you for allowing pharmacy to be a part of this patient's care.  Tawnya Crook, PharmD, BCPS Clinical Pharmacist 01/21/2022 7:47 AM

## 2022-01-21 NOTE — Progress Notes (Signed)
Pt connected to Pt's CPAP

## 2022-01-21 NOTE — Plan of Care (Signed)
  Problem: Education: Goal: Knowledge of General Education information will improve Description: Including pain rating scale, medication(s)/side effects and non-pharmacologic comfort measures Outcome: Progressing   Problem: Nutrition: Goal: Adequate nutrition will be maintained Outcome: Progressing   Problem: Coping: Goal: Level of anxiety will decrease Outcome: Progressing   Problem: Pain Managment: Goal: General experience of comfort will improve Outcome: Progressing   

## 2022-01-21 NOTE — Progress Notes (Signed)
ABI's have been completed. Preliminary results can be found in CV Proc through chart review.   01/21/22 9:02 AM Kayla [name] RVT

## 2022-01-21 NOTE — Progress Notes (Signed)
PROGRESS NOTE    Kayla [name]  CVE:[id number] DOB: 1941-04-04 DOA: 01/20/2022 PCP: Jerline Pain, MD    Chief Complaint  Patient presents with   Nail Problem    Brief Narrative:  Kayla [name] is a 81 y.o. female with medical history significant of COPD and chronic hypoxemia on 3 L oxygen at home, obstructive sleep apnea on CPAP at night, sick sinus syndrome status post pacemaker, A-fib on amiodarone and metoprolol, therapeutic on Eliquis, insulin-dependent type 2 diabetes, chronic venous stasis pigmentation and lymphedema, amputation of the tip of the left great toe presents with pain and discharge from the tip of the left fourth toe.  This started after she clipped her toe about 2 weeks ago and she thinks she might have clipped some skin.  It continued to drain to the point now it is draining pus.  X-ray with destructed toe.  Case discussed with [facility], Dr. Boneta Lucks who requested MRI and ABI.  Assessment & Plan:   Principal Problem:   Toe osteomyelitis, left (HCC) Active Problems:   Insulin dependent type 2 diabetes mellitus ([place])   Hypertension associated with diabetes ([place])   Paroxysmal atrial fibrillation (HCC)   HYPOTHYROIDISM, POSTSURGICAL   Hyperlipidemia associated with type 2 diabetes mellitus (HCC)   OBSTRUCTIVE SLEEP APNEA   COPD (chronic obstructive pulmonary disease) (HCC)   Chronic kidney disease, stage 3b (HCC)   Leg swelling   Polymyalgia rheumatica (HCC)   Left 4 th toe osteomyelitis/ Diabetic toe ulcer: MRI of the foot confirming osteomyelitis.  Appreciate podiatry recommendations.  Continue with broad spectrum IV antibiotics. Persistent leukocytes.  CRP is 5.2, lactic acid is 1.4. MRSA detected. ABI's are pending.  Pain control.  Elevated legs.    Type 2 DM, poorly controlled with diabetic neuropathy, insulin dependent , uncontrolled with hyperglycemia.  CBG (last 3)  Recent Labs    01/20/22 2100 01/21/22 0815  01/21/22 1222  GLUCAP 219* 172* 186*   Resume insulin and SSI.  Continue with gabapentin   COPD WITH chronic hypoxemia on 3 lit of [place] oxygen, In the setting of sleep apnea on CPAP at night.  No wheezing or rhonchi.  Resume home meds.    Hypertensive crisis:  Increase amlodipine 10 mg daily, add lasix 60 mg IV BID, .    Stage 3 b CKD.  Creatinine at baseline.    Hyperlipidemia:  Resume statin.    Paroxysmal Atrial fibrillation  Rate controlled on amiodarone and metoprolol  On Eliquis for anti coagulation.   Hypothyroidism:  Resume synthroid.   Anemia of chronic disease:  Transfuse to keep hemoglobin greater than 7.     DVT prophylaxis:  Code Status: Full code.  Family Communication: family at bedside.  Disposition:   Status is: Inpatient Remains inpatient appropriate because: IV antibiotics.    Level of care: Med-Surg Consultants:  Podiatry.   Procedures: MRI of the left foot.   Antimicrobials: Antibiotics Given (last 72 hours)     Date/Time Action Medication Dose Rate   01/20/22 1618 New Bag/Given   ceFEPIme (MAXIPIME) 2 g in sodium chloride 0.9 % 100 mL IVPB 2 g 200 mL/hr   01/20/22 1845 New Bag/Given   vancomycin (VANCOREADY) IVPB 2000 mg/400 mL 2,000 mg 200 mL/hr   01/20/22 2345 Given   metroNIDAZOLE (FLAGYL) tablet 500 mg 500 mg    01/21/22 0946 Given   metroNIDAZOLE (FLAGYL) tablet 500 mg 500 mg          Subjective:  worsening pedal edema.    Objective: Vitals:   01/21/22 0131 01/21/22 0638 01/21/22 0808 01/21/22 1024  BP: (!) 175/67 (!) 181/70  (!) 170/67  Pulse: 64 (!) 59  63  Resp: '17 17  15  '$ Temp: 98 F (36.7 C) 97.8 F (36.6 C)  98 F (36.7 C)  TempSrc: Oral Oral  Oral  SpO2: 98% 98% 94% 100%  Weight:      Height:        Intake/Output Summary (Last 24 hours) at 01/21/2022 1317 Last data filed at 01/21/2022 1229 Gross per 24 hour  Intake 1340 ml  Output 2350 ml  Net -1010 ml   Filed Weights   01/20/22 1425   Weight: 112.5 kg    Examination:  General exam: Appears calm and comfortable  Respiratory system: Clear to auscultation. Respiratory effort normal. Cardiovascular system: S1 & S2 heard, RRR. No JVD, bilateral pedal edema.  Gastrointestinal system: Abdomen is nondistended, soft and nontender. Normal bowel sounds heard. Central nervous system: Alert and oriented. No focal neurological deficits. Extremities: diabetic wound on the left foot.  Skin: swelling on the left foot. Psychiatry: Mood & affect appropriate.     Data Reviewed: I have personally reviewed following labs and imaging studies  CBC: Recent Labs  Lab 01/20/22 1532 01/21/22 0343  WBC 18.9* 16.8*  NEUTROABS 17.8*  --   HGB 9.5* 8.8*  HCT 30.7* 29.5*  MCV 97.5 97.0  PLT 372 546    Basic Metabolic Panel: Recent Labs  Lab 01/20/22 1532 01/21/22 0343  NA 140 143  K 4.8 4.9  CL 107 108  CO2 24 26  GLUCOSE 184* 165*  BUN 65* 66*  CREATININE 1.98* 2.12*  CALCIUM 8.4* 8.5*    GFR: Estimated Creatinine Clearance: 26.5 mL/min (A) (by C-G formula based on SCr of 2.12 mg/dL (H)).  Liver Function Tests: Recent Labs  Lab 01/20/22 1532  AST 19  ALT 24  ALKPHOS 59  BILITOT 0.3  PROT 6.3*  ALBUMIN 2.4*    CBG: Recent Labs  Lab 01/20/22 1823 01/20/22 2100 01/21/22 0815 01/21/22 1222  GLUCAP 164* 219* 172* 186*     Recent Results (from the past 240 hour(s))  Blood Cultures x 2 sites     Status: None (Preliminary result)   Collection Time: 01/20/22  3:32 PM   Specimen: BLOOD  Result Value Ref Range Status   Specimen Description   Final    BLOOD LEFT ANTECUBITAL Performed at [facility], [place] [address], [place], [place] [address]    Special Requests   Final    BOTTLES DRAWN AEROBIC AND ANAEROBIC Blood Culture adequate volume Performed at [facility] [address], [place], [place] [address]    Culture   Final    NO GROWTH < 24 HOURS Performed at  [facility] [address] [place], [place] [address]    Report Status PENDING  Incomplete  Blood Cultures x 2 sites     Status: None (Preliminary result)   Collection Time: 01/20/22  3:55 PM   Specimen: BLOOD  Result Value Ref Range Status   Specimen Description   Final    BLOOD BLOOD LEFT ARM Performed at [facility] [address], [place], [place] [address]    Special Requests   Final    BOTTLES DRAWN AEROBIC AND ANAEROBIC Blood Culture results may not be optimal due to an inadequate volume of blood received in culture bottles Performed at  [facility], [place] [address] [place], [place] [address]    Culture   Final    NO GROWTH < 12 HOURS Performed at [facility] [address], [place], [place] [address]    Report Status PENDING  Incomplete  MRSA Next Gen by PCR, Nasal     Status: Abnormal   Collection Time: 01/20/22 11:23 PM   Specimen: Nasal Mucosa; Nasal Swab  Result Value Ref Range Status   MRSA by PCR Next Gen DETECTED (A) NOT DETECTED Final    Comment: CRITICAL RESULT CALLED TO, READ BACK BY AND VERIFIED WITH: ESTABEAN,B RN AT 0131 ON 01/21/22 BY [email or web address] (NOTE) The GeneXpert MRSA Assay (FDA approved for NASAL specimens only), is one component of a comprehensive MRSA colonization surveillance program. It is not intended to diagnose MRSA infection nor to guide or monitor treatment for MRSA infections. Test performance is not FDA approved in patients less than 67 years old. Performed at [facility], [place] [address] [place], [place] [address]          Radiology Studies: MRI Left foot without contrast  Result Date: 01/21/2022 CLINICAL DATA:  Osteomyelitis suspected. EXAM: MRI OF THE LEFT FOOT WITHOUT CONTRAST TECHNIQUE: Multiplanar, multisequence MR imaging of the left was performed. No intravenous contrast was administered. COMPARISON:  Radiographs dated January 20, 2022 FINDINGS:  Bones/Joint/Cartilage Postsurgical changes for prior amputation of the first ray at the interphalangeal joint. Bone marrow edema of the proximal phalanx of the second digit, with cortical irregularity consistent with recent fracture. Erosion of the distal phalanx of the fourth digit and edema of the middle and distal phalanx of the fourth digit concerning for osteomyelitis. Susceptibility artifact about the base of the fifth metatarsal from the small metallic fragment. Hallux valgus deformity. Ligaments Collateral and cruciate ligaments are intact. Muscles and Tendons Muscles and tendons are intact. No intramuscular fluid collection or hematoma. Soft tissues Soft tissue edema prominent about the fourth digit with skin irregularity about the distal aspect of the fourth digit consistent with known open wound. Soft tissue swelling about the dorsum of the foot without evidence of fluid collection or hematoma. IMPRESSION: 1. Marrow edema of the middle and distal phalanx the fourth digit with erosion of the distal phalanx, which in the presence of adjacent deep skin wound and inflammatory changes consistent with acute osteomyelitis. 2. Marrow edema of the proximal phalanx of the second digit with cortical irregularity suggesting recent fracture. 3. Postsurgical changes for prior amputation at the first interphalangeal joint with hallux valgus deformity. 4. Soft tissue edema about dorsum of the foot with deep skin wound about the distal aspect of the fourth digit. No fluid collection or abscess. Electronically Signed   By: Keane Police D.O.   On: 01/21/2022 11:30   VAS [place] ABI WITH/WO TBI  Result Date: 01/21/2022  LOWER EXTREMITY DOPPLER STUDY Patient Name:  Kayla [name]  Date of Exam:   01/21/2022 Medical Rec #: [id number]         Accession #:    [id number] Date of Birth: 17-Jan-1941        Patient Gender: F Patient Age:   76 years Exam Location:  [facility] Procedure:      VAS [place] ABI WITH/WO TBI Referring  Phys: Barb Merino --------------------------------------------------------------------------------  Indications: Ulceration. Parasthesia 12/15/2019 - AMPUTATION LEFT GREAT TOE Left High Risk Factors: Hypertension, hyperlipidemia, Diabetes.  Limitations: Today's exam was limited due to patient positioning, an open wound,  bandages , involuntary patient movement and patient unable to lie              flat. Comparison Study: No prior studies. Performing Technologist: Carlos Levering RVT  Examination Guidelines: A complete evaluation includes at minimum, Doppler waveform signals and systolic blood pressure reading at the level of bilateral brachial, anterior tibial, and posterior tibial arteries, when vessel segments are accessible. Bilateral testing is considered an integral part of a complete examination. Photoelectric Plethysmograph (PPG) waveforms and toe systolic pressure readings are included as required and additional duplex testing as needed. Limited examinations for reoccurring indications may be performed as noted.  ABI Findings: +---------+------------------+-----+-----------+--------+ Right    Rt Pressure (mmHg)IndexWaveform   Comment  +---------+------------------+-----+-----------+--------+ Brachial 199                    triphasic           +---------+------------------+-----+-----------+--------+ PTA      205               1.03 multiphasic         +---------+------------------+-----+-----------+--------+ DP       200               1.01 multiphasic         +---------+------------------+-----+-----------+--------+ Great Toe167               0.84                     +---------+------------------+-----+-----------+--------+ +--------+------------------+-----+-----------+-------+ Left    Lt Pressure (mmHg)IndexWaveform   Comment +--------+------------------+-----+-----------+-------+ [id number]                    triphasic           +--------+------------------+-----+-----------+-------+ PTA     254               1.28 multiphasicNC      +--------+------------------+-----+-----------+-------+ DP      247               1.24 multiphasicNC      +--------+------------------+-----+-----------+-------+ +-------+-----------+-----------+------------+------------+ ABI/TBIToday's ABIToday's TBIPrevious ABIPrevious TBI +-------+-----------+-----------+------------+------------+ Right  1.03       0.84                                +-------+-----------+-----------+------------+------------+  Summary: Right: Resting right ankle-brachial index is within normal range. The right toe-brachial index is normal. Unable to obtain great toe waveform due to sensor malfunction. Left: Resting left ankle-brachial index indicates noncompressible left lower extremity arteries. Unable to obtain TBI due to partial great toe amputation. *See table(s) above for measurements and observations.     Preliminary    DG Foot Complete Left  Result Date: 01/20/2022 CLINICAL DATA:  Left great toe infection. EXAM: LEFT FOOT - COMPLETE 3+ VIEW COMPARISON:  Left foot radiographs 12/15/2019 FINDINGS: Redemonstration of amputation of the distal phalanx of the great toe. Interval removal of prior surgical skin staples. New acute to subacute fracture of the medial and lateral aspects of the base of the proximal phalanx of the second toe with 3 mm cortical step-off of the distal portion of the lateral fracture component and intra-articular extension of both the medial and lateral fractures. There is moderate to high-grade fourth toe soft tissue swelling with irregularity at the distal aspect suggesting a wound. There is distal subcutaneous air. There is moderate erosion and fragmentation of the majority of  the distal phalanx of the fourth toe, new from prior. This is concerning for acute osteomyelitis. Redemonstration of 5 by less than 1 mm metallic density overlying  the medial fifth toe soft tissues, likely a chronic foreign body. Moderate plantar calcaneal heel spur. Mild-to-moderate great toe metatarsophalangeal joint space narrowing and peripheral osteophytosis with mild hallux valgus. IMPRESSION: 1. Moderate to high-grade fourth toe soft tissue swelling with distal soft tissue wound and subcutaneous air. Moderate erosion and fragmentation of the majority of the distal phalanx of fourth toe concerning for acute osteomyelitis. 2. New acute to subacute fracture of the medial and lateral aspects of the base of the proximal phalanx of the second toe. 3. Prior amputation of the distal phalanx of the great toe. 4. Unchanged linear metallic foreign body overlying the medial fifth toe. Electronically Signed   By: Yvonne Kendall M.D.   On: 01/20/2022 15:24        Scheduled Meds:  allopurinol  200 mg Oral Daily   amiodarone  200 mg Oral Daily   amLODipine  5 mg Oral Daily   ascorbic acid  500 mg Oral Q lunch   Chlorhexidine Gluconate Cloth  6 each Topical Q0600   vitamin D3  1,000 Units Oral q morning   cyanocobalamin  1,000 mcg Oral Daily   fluticasone  1 spray Each Nare BID   furosemide  60 mg Intravenous BID   gabapentin  300 mg Oral TID   insulin aspart  0-15 Units Subcutaneous TID WC   insulin aspart  0-5 Units Subcutaneous QHS   insulin glargine-yfgn  25 Units Subcutaneous BID   levothyroxine  200 mcg Oral QAC breakfast   metoprolol succinate  100 mg Oral Daily   metroNIDAZOLE  500 mg Oral Q12H   mometasone-formoterol  2 puff Inhalation BID   And   umeclidinium bromide  1 puff Inhalation Daily   mupirocin ointment  1 Application Nasal BID   Continuous Infusions:  ceFEPime (MAXIPIME) IV     [START ON 01/22/2022] vancomycin       LOS: 1 day       Hosie Poisson, MD [facility]   To contact the attending provider between 7A-7P or the covering provider during after hours 7P-7A, please log into the web site [email or web address] and access using  universal [place] password for that web site. If you do not have the password, please call the hospital operator.  01/21/2022, 1:17 PM

## 2022-01-21 NOTE — Consult Note (Signed)
  Subjective:  Patient ID: Kayla [name], female    DOB: [birth year],  MRN: [id number]  A 81 y.o. female medical history significant of COPD and chronic hypoxemia on 3 L oxygen at home, obstructive sleep apnea on CPAP at night, sick sinus syndrome status post pacemaker, A-fib on amiodarone and metoprolol, therapeutic on Eliquis, insulin-dependent type 2 diabetes, chronic venous stasis pigmentation and lymphedema, with osteomyelitis of left fourth digit with underlying purulent drainage.  She states been draining for 2 weeks has progressive gotten worse.  She has a history of amputation of done by Dr. March Rummage in the past.  She denies any other acute complaints.  She knows the toe has to be amputated.  Denies any nausea fever chills vomiting  Objective:   Vitals:   01/21/22 0638 01/21/22 0808  BP: (!) 181/70   Pulse: (!) 59   Resp: 17   Temp: 97.8 F (36.6 C)   SpO2: 98% 94%   General AA&O x3. Normal mood and affect.  Vascular Dorsalis pedis and posterior tibial pulses 2/4 bilat. Brisk capillary refill to all digits. Pedal hair present.  Neurologic Epicritic sensation grossly intact.  Dermatologic Left fourth digit osteomyelitis with some minimal purulent drainage probing down to bone no malodor present mild erythema to the MPJ joint.  Edema noted to the toe.  Orthopedic: MMT 5/5 in dorsiflexion, plantarflexion, inversion, and eversion. Normal joint ROM without pain or crepitus.    Assessment & Plan:  Patient was evaluated and treated and all questions answered.  Left fourth digit osteomyelitis -All questions and concerns were discussed with the patient in extensive detail. -Given the amount of involvement of purulent drainage and osteomyelitis present patient will benefit from left fourth digit amputation with primary closure. -I discussed my preoperative findings in extensive detail she states understanding. -Awaiting MRI for demarcation of the amputation site. -Patient has  noncompressible left lower extremity blood flow.  She will benefit from vascular input as well. -Betadine wet-to-dry dressing -N.p.o. after midnight on Thursday plan for surgery on Friday afternoon -Weightbearing as tolerated in surgical [place], DPM  Accessible via secure chat for questions or concerns.

## 2022-01-22 ENCOUNTER — Encounter (HOSPITAL_COMMUNITY): Payer: Self-pay | Admitting: Internal Medicine

## 2022-01-22 ENCOUNTER — Inpatient Hospital Stay (HOSPITAL_COMMUNITY): Payer: Medicare HMO | Admitting: Anesthesiology

## 2022-01-22 ENCOUNTER — Inpatient Hospital Stay (HOSPITAL_COMMUNITY): Payer: Medicare HMO

## 2022-01-22 ENCOUNTER — Other Ambulatory Visit: Payer: Self-pay

## 2022-01-22 ENCOUNTER — Encounter (HOSPITAL_COMMUNITY)
Admission: EM | Disposition: A | Discharge status: Home / Self Care | Payer: Self-pay | Source: Home / Self Care | Attending: Internal Medicine

## 2022-01-22 DIAGNOSIS — E1169 Type 2 diabetes mellitus with other specified complication: Secondary | ICD-10-CM | POA: Diagnosis not present

## 2022-01-22 DIAGNOSIS — Z794 Long term (current) use of insulin: Secondary | ICD-10-CM

## 2022-01-22 DIAGNOSIS — N1832 Chronic kidney disease, stage 3b: Secondary | ICD-10-CM | POA: Diagnosis not present

## 2022-01-22 DIAGNOSIS — E039 Hypothyroidism, unspecified: Secondary | ICD-10-CM

## 2022-01-22 DIAGNOSIS — D638 Anemia in other chronic diseases classified elsewhere: Secondary | ICD-10-CM

## 2022-01-22 DIAGNOSIS — M869 Osteomyelitis, unspecified: Secondary | ICD-10-CM | POA: Diagnosis not present

## 2022-01-22 DIAGNOSIS — M86672 Other chronic osteomyelitis, left ankle and foot: Secondary | ICD-10-CM | POA: Diagnosis not present

## 2022-01-22 DIAGNOSIS — E11628 Type 2 diabetes mellitus with other skin complications: Secondary | ICD-10-CM | POA: Diagnosis not present

## 2022-01-22 HISTORY — PX: AMPUTATION TOE: SHX6595

## 2022-01-22 LAB — GLUCOSE, CAPILLARY
Glucose-Capillary: 70 mg/dL (ref 70–99)
Glucose-Capillary: 76 mg/dL (ref 70–99)
Glucose-Capillary: 56 mg/dL — ABNORMAL LOW (ref 70–99)
Glucose-Capillary: 104 mg/dL — ABNORMAL HIGH (ref 70–99)
Glucose-Capillary: 167 mg/dL — ABNORMAL HIGH (ref 70–99)
Glucose-Capillary: 259 mg/dL — ABNORMAL HIGH (ref 70–99)

## 2022-01-22 LAB — BASIC METABOLIC PANEL
Anion gap: 8 (ref 5–15)
BUN: 61 mg/dL — ABNORMAL HIGH (ref 8–23)
CO2: 24 mmol/L (ref 22–32)
Calcium: 8.4 mg/dL — ABNORMAL LOW (ref 8.9–10.3)
Chloride: 107 mmol/L (ref 98–111)
Creatinine, Ser: 1.66 mg/dL — ABNORMAL HIGH (ref 0.44–1.00)
GFR, Estimated: 31 mL/min — ABNORMAL LOW (ref >60)
Glucose, Bld: 83 mg/dL (ref 70–99)
Potassium: 4.2 mmol/L (ref 3.5–5.1)
Sodium: 139 mmol/L (ref 135–145)

## 2022-01-22 SURGERY — AMPUTATION, TOE
Anesthesia: Monitor Anesthesia Care | Site: Toe | Laterality: Left

## 2022-01-22 MED ORDER — CHLORHEXIDINE GLUCONATE 0.12 % MT SOLN
15.0000 mL | Freq: Once | OROMUCOSAL | Status: AC
  Administered 2022-01-22: 15 mL via OROMUCOSAL

## 2022-01-22 MED ORDER — MORPHINE SULFATE (PF) 2 MG/ML IV SOLN
1.0000 mg | INTRAVENOUS | Status: DC | PRN
  Administered 2022-01-22 – 2022-01-23 (×2): 2 mg via INTRAVENOUS
  Filled 2022-01-22 (×2): qty 1

## 2022-01-22 MED ORDER — LACTATED RINGERS IV SOLN: INTRAVENOUS | Status: DC

## 2022-01-22 MED ORDER — DEXTROSE 50 % IV SOLN
INTRAVENOUS | Status: AC
  Administered 2022-01-22: 10 mL
  Filled 2022-01-22: qty 50

## 2022-01-22 MED ORDER — BUPIVACAINE HCL 0.25 % IJ SOLN
INTRAMUSCULAR | Status: DC | PRN
  Administered 2022-01-22: 5 mL

## 2022-01-22 MED ORDER — PROPOFOL 1000 MG/100ML IV EMUL
INTRAVENOUS | Status: AC
  Filled 2022-01-22: qty 100

## 2022-01-22 MED ORDER — LIDOCAINE 1 % OPTIME INJ - NO CHARGE
INTRAMUSCULAR | Status: DC | PRN
  Administered 2022-01-22: 5 mL

## 2022-01-22 MED ORDER — PROPOFOL 10 MG/ML IV BOLUS
INTRAVENOUS | Status: DC | PRN
  Administered 2022-01-22 (×3): 10 mg via INTRAVENOUS

## 2022-01-22 MED ORDER — BUPIVACAINE HCL (PF) 0.25 % IJ SOLN
INTRAMUSCULAR | Status: AC
  Filled 2022-01-22: qty 30

## 2022-01-22 MED ORDER — LIDOCAINE HCL (PF) 1 % IJ SOLN
INTRAMUSCULAR | Status: AC
  Filled 2022-01-22: qty 30

## 2022-01-22 MED ORDER — ONDANSETRON HCL 4 MG/2ML IJ SOLN
INTRAMUSCULAR | Status: AC
  Filled 2022-01-22: qty 2

## 2022-01-22 MED ORDER — PROPOFOL 500 MG/50ML IV EMUL
INTRAVENOUS | Status: DC | PRN
  Administered 2022-01-22: 100 ug/kg/min via INTRAVENOUS

## 2022-01-22 MED ORDER — ALBUTEROL SULFATE (2.5 MG/3ML) 0.083% IN NEBU
2.5000 mg | INHALATION_SOLUTION | Freq: Two times a day (BID) | RESPIRATORY_TRACT | Status: DC
  Administered 2022-01-22 – 2022-01-25 (×6): 2.5 mg via RESPIRATORY_TRACT
  Filled 2022-01-22 (×6): qty 3

## 2022-01-22 MED ORDER — GLYCOPYRROLATE 0.2 MG/ML IJ SOLN
INTRAMUSCULAR | Status: AC
  Filled 2022-01-22: qty 4

## 2022-01-22 SURGICAL SUPPLY — 44 items
BAG COUNTER SPONGE SURGICOUNT (BAG) IMPLANT
BLADE SURG 15 STRL LF DISP TIS (BLADE) ×2 IMPLANT
BLADE SURG 15 STRL SS (BLADE) ×2
BNDG ELASTIC 3X5.8 VLCR STR LF (GAUZE/BANDAGES/DRESSINGS) IMPLANT
BNDG ELASTIC 4X5.8 VLCR STR LF (GAUZE/BANDAGES/DRESSINGS) ×1 IMPLANT
BNDG ESMARK 4X9 LF (GAUZE/BANDAGES/DRESSINGS) IMPLANT
BNDG GAUZE DERMACEA FLUFF 4 (GAUZE/BANDAGES/DRESSINGS) ×2 IMPLANT
CHLORAPREP W/TINT 26 (MISCELLANEOUS) ×1 IMPLANT
COVER BACK TABLE 60X90IN (DRAPES) ×1 IMPLANT
CUFF TOURN SGL QUICK 34 (TOURNIQUET CUFF)
CUFF TRNQT CYL 34X4.125X (TOURNIQUET CUFF) IMPLANT
DRAPE EXTREMITY T 121X128X90 (DISPOSABLE) ×1 IMPLANT
DRAPE IMP U-DRAPE 54X76 (DRAPES) ×1 IMPLANT
DRAPE SURG 17X23 STRL (DRAPES) IMPLANT
DRAPE U-SHAPE 47X51 STRL (DRAPES) ×1 IMPLANT
DRSG EMULSION OIL 3X3 NADH (GAUZE/BANDAGES/DRESSINGS) ×1 IMPLANT
ELECT REM PT RETURN 15FT ADLT (MISCELLANEOUS) ×1 IMPLANT
GAUZE 4X4 16PLY ~~LOC~~+RFID DBL (SPONGE) IMPLANT
GAUZE SPONGE 4X4 12PLY STRL (GAUZE/BANDAGES/DRESSINGS) ×1 IMPLANT
GLOVE BIO SURGEON STRL SZ7 (GLOVE) ×1 IMPLANT
GLOVE BIOGEL PI IND STRL 7.5 (GLOVE) ×1 IMPLANT
GOWN STRL REUS W/ TWL LRG LVL3 (GOWN DISPOSABLE) ×1 IMPLANT
GOWN STRL REUS W/ TWL XL LVL3 (GOWN DISPOSABLE) ×1 IMPLANT
GOWN STRL REUS W/TWL LRG LVL3 (GOWN DISPOSABLE) ×1
GOWN STRL REUS W/TWL XL LVL3 (GOWN DISPOSABLE) ×1
KIT BASIN OR (CUSTOM PROCEDURE TRAY) ×1 IMPLANT
KIT TURNOVER KIT A (KITS) IMPLANT
NDL HYPO 25X1 1.5 SAFETY (NEEDLE) ×1 IMPLANT
NDL SAFETY ECLIP 18X1.5 (MISCELLANEOUS) IMPLANT
NEEDLE HYPO 25X1 1.5 SAFETY (NEEDLE) ×1 IMPLANT
NS IRRIG 1000ML POUR BTL (IV SOLUTION) IMPLANT
PADDING CAST ABS COTTON 4X4 ST (CAST SUPPLIES) ×2 IMPLANT
PENCIL SMOKE EVACUATOR (MISCELLANEOUS) ×1 IMPLANT
STAPLER VISISTAT 35W (STAPLE) IMPLANT
STOCKINETTE 6  STRL (DRAPES) ×1
STOCKINETTE 6 STRL (DRAPES) ×1 IMPLANT
SUT MNCRL AB 3-0 PS2 18 (SUTURE) IMPLANT
SUT MNCRL AB 4-0 PS2 18 (SUTURE) IMPLANT
SUT MON AB 5-0 PS2 18 (SUTURE) IMPLANT
SUT PROLENE 4 0 PS 2 18 (SUTURE) IMPLANT
SUT VIC AB 4-0 P2 18 (SUTURE) IMPLANT
SYR BULB EAR ULCER 3OZ GRN STR (SYRINGE) ×1 IMPLANT
SYR CONTROL 10ML LL (SYRINGE) IMPLANT
UNDERPAD 30X36 HEAVY ABSORB (UNDERPADS AND DIAPERS) ×1 IMPLANT

## 2022-01-22 NOTE — Interval H&P Note (Signed)
History and Physical Interval Note:  01/22/2022 11:53 AM  Kayla [name]  has presented today for surgery, with the diagnosis of osteomyelitis.  The various methods of treatment have been discussed with the patient and family. After consideration of risks, benefits and other options for treatment, the patient has consented to  Procedure(s): AMPUTATION TOE 4th digit (Left) as a surgical intervention.  The patient's history has been reviewed, patient examined, no change in status, stable for surgery.  I have reviewed the patient's chart and labs.  Questions were answered to the patient's satisfaction.     Felipa Furnace

## 2022-01-22 NOTE — Transfer of Care (Signed)
Immediate Anesthesia Transfer of Care Note  Patient: Kayla [name]  Procedure(s) Performed: AMPUTATION TOE 4th digit (Left: Toe)  Patient Location: PACU  Anesthesia Type:MAC  Level of Consciousness: sedated  Airway & Oxygen Therapy: Patient Spontanous Breathing and Patient connected to face mask oxygen  Post-op Assessment: Report given to RN and Post -op Vital signs reviewed and stable  Post vital signs: Reviewed and stable  Last Vitals:  Vitals Value Taken Time  BP 161/54 01/22/22 1255  Temp    Pulse 58 01/22/22 1256  Resp 24 01/22/22 1256  SpO2 99 % 01/22/22 1256  Vitals shown include unvalidated device data.  Last Pain:  Vitals:   01/22/22 1107  TempSrc: Oral  PainSc: 7       Patients Stated Pain Goal: 4 (60/15/61 5379)  Complications: No notable events documented.

## 2022-01-22 NOTE — Anesthesia Postprocedure Evaluation (Signed)
Anesthesia Post Note  Patient: Kayla [name]  Procedure(s) Performed: AMPUTATION TOE 4th digit (Left: Toe)     Patient location during evaluation: PACU Anesthesia Type: MAC Level of consciousness: awake and alert, patient cooperative and oriented Pain management: pain level controlled Vital Signs Assessment: post-procedure vital signs reviewed and stable Respiratory status: nonlabored ventilation, spontaneous breathing, respiratory function stable and patient connected to nasal cannula oxygen Cardiovascular status: stable and blood pressure returned to baseline Postop Assessment: no apparent nausea or vomiting and adequate PO intake Anesthetic complications: no   No notable events documented.  Last Vitals:  Vitals:   01/22/22 1330 01/22/22 1350  BP:  (!) 197/67  Pulse: (!) 59 62  Resp: 17 18  Temp:  36.6 C  SpO2: 99% 91%    Last Pain:  Vitals:   01/22/22 1350  TempSrc: Oral  PainSc:                  Marisela Line,E. Adden Strout

## 2022-01-22 NOTE — Progress Notes (Signed)
Orthopedic Tech Progress Note Patient Details:  Kayla [name] [birth year] [id number]  Ortho Devices Type of Ortho Device: Postop shoe/boot Ortho Device/Splint Location: Left foot Ortho Device/Splint Interventions: Application   Post Interventions Patient Tolerated: Well  Kaityln Kallstrom E Lester Platas 01/22/2022, 1:30 PM

## 2022-01-22 NOTE — Progress Notes (Addendum)
PROGRESS NOTE    [name]  Kayla [name]:[id number] DOB: July 23, 1940 DOA: 01/20/2022 PCP: Jerline Pain, MD    Chief Complaint  Patient presents with   Nail Problem    Brief Narrative:  Kayla [name] is a 81 y.o. female with medical history significant of COPD and chronic hypoxemia on 3 L oxygen at home, obstructive sleep apnea on CPAP at night, sick sinus syndrome status post pacemaker, A-fib on amiodarone and metoprolol, therapeutic on Eliquis, insulin-dependent type 2 diabetes, chronic venous stasis pigmentation and lymphedema, amputation of the tip of the left great toe presents with pain and discharge from the tip of the left fourth toe.  This started after she clipped her toe about 2 weeks ago and she thinks she might have clipped some skin.  It continued to drain to the point now it is draining pus.  X-ray with destructed toe.  Case discussed with [facility], Dr. Boneta Lucks who requested MRI and ABI.  Assessment & Plan:   Principal Problem:   Toe osteomyelitis, left (HCC) Active Problems:   Insulin dependent type 2 diabetes mellitus ([place])   Hypertension associated with diabetes ([place])   Paroxysmal atrial fibrillation (HCC)   HYPOTHYROIDISM, POSTSURGICAL   Hyperlipidemia associated with type 2 diabetes mellitus (HCC)   OBSTRUCTIVE SLEEP APNEA   COPD (chronic obstructive pulmonary disease) (HCC)   Chronic kidney disease, stage 3b (HCC)   Leg swelling   Polymyalgia rheumatica (HCC)   Left 4 th toe osteomyelitis/ Diabetic toe ulcer: MRI of the foot confirming osteomyelitis.  Appreciate podiatry recommendations.  Continue with broad spectrum IV antibiotics. Persistent leukocytes.  CRP is 5.2, lactic acid is 1.4. MRSA detected. ABI's  done and reviewed.  Pain control with IV morphine and oxycodone.  Elevated legs.  Podiatry on board and underwent toe amputation on the 4 th digit.     Type 2 DM, poorly controlled with diabetic neuropathy, insulin dependent  , uncontrolled with hyperglycemia.  CBG (last 3)  Recent Labs    01/21/22 2314 01/22/22 0803 01/22/22 1056  GLUCAP 168* 70 76    Resume insulin and SSI.  Continue with gabapentin . No changes in  meds   COPD WITH chronic hypoxemia on 3 lit of [place] oxygen, Chronic hypoxic respiratory failure In the setting of sleep apnea on CPAP at night.  No wheezing or rhonchi.  Resume home meds.    Uncontrolled hypertension partly from toe pain.  Increase amlodipine 10 mg daily, add lasix 60 mg IV BID, .    Stage 3 b CKD.  Creatinine at baseline.    Hyperlipidemia:  Resume statin.    Paroxysmal Atrial fibrillation  Rate controlled on amiodarone and metoprolol  Restart eliquis in am.   Hypothyroidism:  Resume synthroid.   Anemia of chronic disease:  Transfuse to keep hemoglobin greater than 7.  Check CBC in am.   Body mass index is 41.09 kg/m. Morbid obesity. Recommend outpatient followup with PCP.    Acute on chronic diastolic heart failure:  - started on IV lasix.  - she remains on 3 lit of [place] oxygen.  - diuresing well, about 3.2 lit in the last 24 hours.     DVT prophylaxis: eliquis.  Code Status: Full code.  Family Communication: family at bedside.  Disposition:   Status is: Inpatient Remains inpatient appropriate because: IV antibiotics. IV pain control.    Level of care: Med-Surg Consultants:  Podiatry.   Procedures: MRI of the left foot.  Antimicrobials: Antibiotics Given (last 72 hours)     Date/Time Action Medication Dose Rate   01/20/22 1618 New Bag/Given   ceFEPIme (MAXIPIME) 2 g in sodium chloride 0.9 % 100 mL IVPB 2 g 200 mL/hr   01/20/22 1845 New Bag/Given   vancomycin (VANCOREADY) IVPB 2000 mg/400 mL 2,000 mg 200 mL/hr   01/20/22 2345 Given   [MAR Hold] metroNIDAZOLE (FLAGYL) tablet 500 mg (MAR Hold since Thu 01/22/2022 at 1048.Hold Reason: Transfer to a Procedural area) 500 mg    01/21/22 0946 Given   [MAR Hold] metroNIDAZOLE (FLAGYL)  tablet 500 mg (MAR Hold since Thu 01/22/2022 at 1048.Hold Reason: Transfer to a Procedural area) 500 mg    01/21/22 2012 Given   [MAR Hold] metroNIDAZOLE (FLAGYL) tablet 500 mg (MAR Hold since Thu 01/22/2022 at 1048.Hold Reason: Transfer to a Procedural area) 500 mg    01/22/22 0855 Given   [MAR Hold] metroNIDAZOLE (FLAGYL) tablet 500 mg (MAR Hold since Thu 01/22/2022 at 1048.Hold Reason: Transfer to a Procedural area) 500 mg          Subjective:   Left foot pain.    Objective: Vitals:   01/22/22 0505 01/22/22 0817 01/22/22 1001 01/22/22 1107  BP: (!) 173/62  (!) 153/61 (!) 175/62  Pulse: 63  (!) 59 71  Resp: '18  17 16  '$ Temp: 98.1 F (36.7 C)  98 F (36.7 C) 98.3 F (36.8 C)  TempSrc: Oral  Oral Oral  SpO2: 97% 98% 100% 98%  Weight:    112 kg  Height:    '5\' 5"'$  (1.651 m)    Intake/Output Summary (Last 24 hours) at 01/22/2022 1144 Last data filed at 01/22/2022 0600 Gross per 24 hour  Intake 1210 ml  Output 3200 ml  Net -1990 ml    Filed Weights   01/20/22 1425 01/22/22 1107  Weight: 112.5 kg 112 kg    Examination:  General exam: Appears calm and comfortable  Respiratory system: Clear to auscultation. Respiratory effort normal. Cardiovascular system: S1 & S2 heard, RRR. No JVD,  Gastrointestinal system: Abdomen is nondistended, soft and nontender.  Normal bowel sounds heard. Central nervous system: Alert and oriented. No focal neurological deficits. Extremities: left foot bandaged. . Skin: No rashes seen.  Psychiatry:  Mood & affect appropriate.      Data Reviewed: I have personally reviewed following labs and imaging studies  CBC: Recent Labs  Lab 01/20/22 1532 01/21/22 0343  WBC 18.9* 16.8*  NEUTROABS 17.8*  --   HGB 9.5* 8.8*  HCT 30.7* 29.5*  MCV 97.5 97.0  PLT 372 357     Basic Metabolic Panel: Recent Labs  Lab 01/20/22 1532 01/21/22 0343 01/22/22 0934  NA 140 143 139  K 4.8 4.9 4.2  CL 107 108 107  CO2 '24 26 24  '$ GLUCOSE 184* 165* 83   BUN 65* 66* 61*  CREATININE 1.98* 2.12* 1.66*  CALCIUM 8.4* 8.5* 8.4*     GFR: Estimated Creatinine Clearance: 33.7 mL/min (A) (by C-G formula based on SCr of 1.66 mg/dL (H)).  Liver Function Tests: Recent Labs  Lab 01/20/22 1532  AST 19  ALT 24  ALKPHOS 59  BILITOT 0.3  PROT 6.3*  ALBUMIN 2.4*     CBG: Recent Labs  Lab 01/21/22 1222 01/21/22 1650 01/21/22 2314 01/22/22 0803 01/22/22 1056  GLUCAP 186* 131* 168* 70 76      Recent Results (from the past 240 hour(s))  Blood Cultures x 2 sites     Status: None (  Preliminary result)   Collection Time: 01/20/22  3:32 PM   Specimen: BLOOD  Result Value Ref Range Status   Specimen Description   Final    BLOOD LEFT ANTECUBITAL Performed at [facility] [address], [place], [place] [address]    Special Requests   Final    BOTTLES DRAWN AEROBIC AND ANAEROBIC Blood Culture adequate volume Performed at [facility] [address] [place], [place] [address]    Culture   Final    NO GROWTH 2 DAYS Performed at [facility] [address], [place], [place] [address]    Report Status PENDING  Incomplete  Blood Cultures x 2 sites     Status: None (Preliminary result)   Collection Time: 01/20/22  3:55 PM   Specimen: BLOOD  Result Value Ref Range Status   Specimen Description   Final    BLOOD BLOOD LEFT ARM Performed at [facility] [address], [place], [place] [address]    Special Requests   Final    BOTTLES DRAWN AEROBIC AND ANAEROBIC Blood Culture results may not be optimal due to an inadequate volume of blood received in culture bottles Performed at [facility] [address], [place], [place] [address]    Culture   Final    NO GROWTH 2 DAYS Performed at [facility] [address], [place], [place] [address]    Report Status PENDING  Incomplete  MRSA Next Gen by PCR, Nasal     Status: Abnormal   Collection  Time: 01/20/22 11:23 PM   Specimen: Nasal Mucosa; Nasal Swab  Result Value Ref Range Status   MRSA by PCR Next Gen DETECTED (A) NOT DETECTED Final    Comment: CRITICAL RESULT CALLED TO, READ BACK BY AND VERIFIED WITH: ESTABEAN,B RN AT 0131 ON 01/21/22 BY [email or web address] (NOTE) The GeneXpert MRSA Assay (FDA approved for NASAL specimens only), is one component of a comprehensive MRSA colonization surveillance program. It is not intended to diagnose MRSA infection nor to guide or monitor treatment for MRSA infections. Test performance is not FDA approved in patients less than 24 years old. Performed at [facility], [place] [address] [place], [place] [address]          Radiology Studies: VAS [place] ABI WITH/WO TBI  Result Date: 01/21/2022  LOWER EXTREMITY DOPPLER STUDY Patient Name:  LABERTA WILBON  Date of Exam:   01/21/2022 Medical Rec #: [id number]         Accession #:    [id number] Date of Birth: 1940/07/07        Patient Gender: F Patient Age:   38 years Exam Location:  [facility] Procedure:      VAS [place] ABI WITH/WO TBI Referring Phys: Barb Merino --------------------------------------------------------------------------------  Indications: Ulceration. Parasthesia 12/15/2019 - AMPUTATION LEFT GREAT TOE Left High Risk Factors: Hypertension, hyperlipidemia, Diabetes.  Limitations: Today's exam was limited due to patient positioning, an open wound,              bandages , involuntary patient movement and patient unable to lie              flat. Comparison Study: No prior studies. Performing Technologist: Carlos Levering RVT  Examination Guidelines: A complete evaluation includes at minimum, Doppler waveform signals and systolic blood pressure reading at the level of bilateral brachial, anterior tibial, and posterior tibial arteries, when vessel segments are accessible. Bilateral testing is considered  an integral part of a complete examination. Photoelectric Plethysmograph (PPG)  waveforms and toe systolic pressure readings are included as required and additional duplex testing as needed. Limited examinations for reoccurring indications may be performed as noted.  ABI Findings: +---------+------------------+-----+-----------+--------+ Right    Rt Pressure (mmHg)IndexWaveform   Comment  +---------+------------------+-----+-----------+--------+ Brachial 199                    triphasic           +---------+------------------+-----+-----------+--------+ PTA      205               1.03 multiphasic         +---------+------------------+-----+-----------+--------+ DP       200               1.01 multiphasic         +---------+------------------+-----+-----------+--------+ Great Toe167               0.84                     +---------+------------------+-----+-----------+--------+ +--------+------------------+-----+-----------+-------+ Left    Lt Pressure (mmHg)IndexWaveform   Comment +--------+------------------+-----+-----------+-------+ [id number]                    triphasic          +--------+------------------+-----+-----------+-------+ PTA     254               1.28 multiphasicNC      +--------+------------------+-----+-----------+-------+ DP      247               1.24 multiphasicNC      +--------+------------------+-----+-----------+-------+ +-------+-----------+-----------+------------+------------+ ABI/TBIToday's ABIToday's TBIPrevious ABIPrevious TBI +-------+-----------+-----------+------------+------------+ Right  1.03       0.84                                +-------+-----------+-----------+------------+------------+   Summary: Right: Resting right ankle-brachial index is within normal range. The right toe-brachial index is normal. Unable to obtain great toe waveform due to sensor malfunction. Left: Resting left ankle-brachial index indicates noncompressible left lower extremity arteries. Unable to obtain TBI due to  partial great toe amputation. *See table(s) above for measurements and observations.  Electronically signed by Jamelle Haring on 01/21/2022 at 4:18:47 PM.    Final    MRI Left foot without contrast  Result Date: 01/21/2022 CLINICAL DATA:  Osteomyelitis suspected. EXAM: MRI OF THE LEFT FOOT WITHOUT CONTRAST TECHNIQUE: Multiplanar, multisequence MR imaging of the left was performed. No intravenous contrast was administered. COMPARISON:  Radiographs dated January 20, 2022 FINDINGS: Bones/Joint/Cartilage Postsurgical changes for prior amputation of the first ray at the interphalangeal joint. Bone marrow edema of the proximal phalanx of the second digit, with cortical irregularity consistent with recent fracture. Erosion of the distal phalanx of the fourth digit and edema of the middle and distal phalanx of the fourth digit concerning for osteomyelitis. Susceptibility artifact about the base of the fifth metatarsal from the small metallic fragment. Hallux valgus deformity. Ligaments Collateral and cruciate ligaments are intact. Muscles and Tendons Muscles and tendons are intact. No intramuscular fluid collection or hematoma. Soft tissues Soft tissue edema prominent about the fourth digit with skin irregularity about the distal aspect of the fourth digit consistent with known open wound. Soft tissue swelling about the dorsum of the foot without evidence of fluid collection or hematoma. IMPRESSION: 1. Marrow edema of the  middle and distal phalanx the fourth digit with erosion of the distal phalanx, which in the presence of adjacent deep skin wound and inflammatory changes consistent with acute osteomyelitis. 2. Marrow edema of the proximal phalanx of the second digit with cortical irregularity suggesting recent fracture. 3. Postsurgical changes for prior amputation at the first interphalangeal joint with hallux valgus deformity. 4. Soft tissue edema about dorsum of the foot with deep skin wound about the distal aspect  of the fourth digit. No fluid collection or abscess. Electronically Signed   By: Keane Police D.O.   On: 01/21/2022 11:30   DG Foot Complete Left  Result Date: 01/20/2022 CLINICAL DATA:  Left great toe infection. EXAM: LEFT FOOT - COMPLETE 3+ VIEW COMPARISON:  Left foot radiographs 12/15/2019 FINDINGS: Redemonstration of amputation of the distal phalanx of the great toe. Interval removal of prior surgical skin staples. New acute to subacute fracture of the medial and lateral aspects of the base of the proximal phalanx of the second toe with 3 mm cortical step-off of the distal portion of the lateral fracture component and intra-articular extension of both the medial and lateral fractures. There is moderate to high-grade fourth toe soft tissue swelling with irregularity at the distal aspect suggesting a wound. There is distal subcutaneous air. There is moderate erosion and fragmentation of the majority of the distal phalanx of the fourth toe, new from prior. This is concerning for acute osteomyelitis. Redemonstration of 5 by less than 1 mm metallic density overlying the medial fifth toe soft tissues, likely a chronic foreign body. Moderate plantar calcaneal heel spur. Mild-to-moderate great toe metatarsophalangeal joint space narrowing and peripheral osteophytosis with mild hallux valgus. IMPRESSION: 1. Moderate to high-grade fourth toe soft tissue swelling with distal soft tissue wound and subcutaneous air. Moderate erosion and fragmentation of the majority of the distal phalanx of fourth toe concerning for acute osteomyelitis. 2. New acute to subacute fracture of the medial and lateral aspects of the base of the proximal phalanx of the second toe. 3. Prior amputation of the distal phalanx of the great toe. 4. Unchanged linear metallic foreign body overlying the medial fifth toe. Electronically Signed   By: Yvonne Kendall M.D.   On: 01/20/2022 15:24        Scheduled Meds:  [MAR Hold] allopurinol  200 mg  Oral Daily   [MAR Hold] amiodarone  200 mg Oral Daily   [MAR Hold] amLODipine  10 mg Oral Daily   [MAR Hold] ascorbic acid  500 mg Oral Q lunch   [MAR Hold] atorvastatin  40 mg Oral Daily   [MAR Hold] Chlorhexidine Gluconate Cloth  6 each Topical Q0600   [MAR Hold] vitamin D3  1,000 Units Oral q morning   [MAR Hold] cyanocobalamin  1,000 mcg Oral Daily   [MAR Hold] fluticasone  1 spray Each Nare BID   [MAR Hold] furosemide  60 mg Intravenous BID   [MAR Hold] gabapentin  300 mg Oral TID   [MAR Hold] insulin aspart  0-15 Units Subcutaneous TID WC   [MAR Hold] insulin aspart  0-5 Units Subcutaneous QHS   [MAR Hold] insulin glargine-yfgn  25 Units Subcutaneous BID   [MAR Hold] levothyroxine  200 mcg Oral QAC breakfast   [MAR Hold] metoprolol succinate  100 mg Oral Daily   [MAR Hold] metroNIDAZOLE  500 mg Oral Q12H   [MAR Hold] mometasone-formoterol  2 puff Inhalation BID   And   [MAR Hold] umeclidinium bromide  1 puff Inhalation Daily   [  MAR Hold] mupirocin ointment  1 Application Nasal BID   Continuous Infusions:  [MAR Hold] ceFEPime (MAXIPIME) IV 2 g (01/21/22 1536)   lactated ringers 10 mL/hr at 01/22/22 1131   [MAR Hold] vancomycin       LOS: 2 days       Hosie Poisson, MD [facility]   To contact the attending provider between 7A-7P or the covering provider during after hours 7P-7A, please log into the web site [email or web address] and access using universal [place] password for that web site. If you do not have the password, please call the hospital operator.  01/22/2022, 11:44 AM

## 2022-01-22 NOTE — Progress Notes (Signed)
  Transition of Care [facility]) Screening Note   Patient Details  Name: ROZLYN YERBY Date of Birth: 04-Feb-1941   [facility] of [facility] CM/SW Contact:    Lennart Pall, LCSW Phone Number: 01/22/2022, 2:44 PM    Transition of Care Department [facility]) has reviewed patient and no TOC needs have been identified at this time. We will continue to monitor patient advancement through interdisciplinary progression rounds. If new patient transition needs arise, please place a TOC consult.

## 2022-01-22 NOTE — Anesthesia Preprocedure Evaluation (Addendum)
Anesthesia Evaluation  Patient identified by MRN, date of birth, ID band Patient awake    Reviewed: Allergy & Precautions, NPO status , Patient's Chart, lab work & pertinent test results, reviewed documented beta blocker date and time   History of Anesthesia Complications (+) PONV and history of anesthetic complications  Airway Mallampati: II  TM Distance: >3 FB Neck ROM: Full    Dental  (+) Edentulous Upper, Dental Advisory Given, Missing, Poor Dentition   Pulmonary shortness of breath, sleep apnea, Continuous Positive Airway Pressure Ventilation and Oxygen sleep apnea , COPD,  COPD inhaler and oxygen dependent, former smoker,    breath sounds clear to auscultation       Cardiovascular hypertension, Pt. on medications and Pt. on home beta blockers (-) angina+ CAD (non-obstructive)  + dysrhythmias Atrial Fibrillation + pacemaker  Rhythm:Regular Rate:Normal  '22 ECHO: EF 60-65%. The LV has normal function, no regional wall motion abnormalities. There is severe concentric LVH. Grade II DD, normal RVF, no significant valvular abnormalities   Neuro/Psych  Headaches, Anxiety    GI/Hepatic Neg liver ROS, GERD  Controlled,  Endo/Other  diabetes (glu 76), Insulin DependentHypothyroidism BMI 41  Renal/GU Renal InsufficiencyRenal disease     Musculoskeletal  (+) Arthritis ,   Abdominal (+) + obese,   Peds  Hematology  (+) Blood dyscrasia (Hb 8.8), anemia , eliquis   Anesthesia Other Findings   Reproductive/Obstetrics                            Anesthesia Physical Anesthesia Plan  ASA: 4  Anesthesia Plan: MAC   Post-op Pain Management: Tylenol PO (pre-op)*   Induction: Intravenous  PONV Risk Score and Plan: 3 and Ondansetron, Dexamethasone and Treatment may vary due to age or medical condition  Airway Management Planned: Simple Face Mask and Natural Airway  Additional Equipment:  None  Intra-op Plan:   Post-operative Plan:   Informed Consent: I have reviewed the patients History and Physical, chart, labs and discussed the procedure including the risks, benefits and alternatives for the proposed anesthesia with the patient or authorized representative who has indicated his/her understanding and acceptance.     Dental advisory given  Plan Discussed with: CRNA and Surgeon  Anesthesia Plan Comments:        Anesthesia Quick Evaluation

## 2022-01-22 NOTE — Progress Notes (Signed)
PT Cancellation Note  Patient Details Name: Kayla [name] MRN: [id number] DOB: 1941/03/01   Cancelled Treatment:    Reason Eval/Treat Not Completed: Other (comment) (Pt to go to OR today for toe amputation. Will follow at later time/date as schedule allows.)  Gwynneth Albright PT, DPT Acute Rehabilitation Services Office [phone] Pager [phone]  01/22/22 9:46 AM

## 2022-01-22 NOTE — Op Note (Signed)
Surgeon: Surgeon(s): Felipa Furnace, DPM  Assistants: None Pre-operative diagnosis: osteomyelitis  Post-operative diagnosis: same Procedure: Procedure(s) (LRB): AMPUTATION TOE 4th digit (Left)  Pathology:  ID Type Source Tests Collected by Time Destination  1 : left fourth toe  Tissue PATH Digit amputation SURGICAL PATHOLOGY Felipa Furnace, DPM 01/22/2022 1243     Pertinent Intra-op findings: Left significant osteomyelitic changes noted to the fourth digit as well as some purulent drainage.  Nothing of the clean margin* Anesthesia: General  Hemostasis: * No tourniquets in log * EBL: 50 cc Materials: 3-0 Prolene Injectables: 10 cc of 1% lidocaine plain half percent Marcaine plain Complications: None  Indications for surgery: A 81 y.o. female presents with left fourth digit osteomyelitis. Patient has failed all conservative therapy including but not limited to local wound care IV antibiotics. She wishes to have surgical correction of the foot/deformity. It was determined that patient would benefit from left fourth digit amputation. Informed surgical risk consent was reviewed and read aloud to the patient.  I reviewed the films.  I have discussed my findings with the patient in great detail.  I have discussed all risks including but not limited to infection, stiffness, scarring, limp, disability, deformity, damage to blood vessels and nerves, numbness, poor healing, need for braces, arthritis, chronic pain, amputation, death.  All benefits and realistic expectations discussed in great detail.  I have made no promises as to the outcome.  I have provided realistic expectations.  I have offered the patient a 2nd opinion, which they have declined and assured me they preferred to proceed despite the risks   Procedure in detail: The patient was both verbally and visually identified by myself, the nursing staff, and anesthesia staff in the preoperative holding area. They were then transferred to the  operating room and placed on the operative table in supine position.  Attention was directed to the left fourth toe, using skin marker at the fishmouth style incision delineated.  Using #15 blade the incision was carried down from epidermal junction down to the level of the fourth metatarsophalangeal joint.  The fourth digit was disarticulated at the joint.  At this time is important note no infection was noted proximally.  Soft friable digit was noted at the fourth which was sent to pathology in standard technique.  The wound was thoroughly irrigated with normal saline solution.  The wound was primarily closed with 3-0 Prolene the incision was dressed with Betadine 4 x 4 Kerlix Ace bandage.  Disposition Patient is okay to be discharged from my standpoint.  No dressing change until follow-up follow-up 1 week from discharge.  10 days of doxycycline.  Weightbearing as tolerated with surgical shoe.  At the conclusion of the procedure the patient was awoken from anesthesia and found to have tolerated the procedure well any complications. There were transferred to PACU with vital signs stable and vascular status intact.  Boneta Lucks, DPM

## 2022-01-23 ENCOUNTER — Encounter (HOSPITAL_COMMUNITY): Payer: Self-pay | Admitting: Podiatry

## 2022-01-23 ENCOUNTER — Inpatient Hospital Stay (HOSPITAL_COMMUNITY): Payer: Medicare HMO

## 2022-01-23 DIAGNOSIS — M869 Osteomyelitis, unspecified: Secondary | ICD-10-CM | POA: Diagnosis not present

## 2022-01-23 DIAGNOSIS — E1169 Type 2 diabetes mellitus with other specified complication: Secondary | ICD-10-CM | POA: Diagnosis not present

## 2022-01-23 DIAGNOSIS — N1832 Chronic kidney disease, stage 3b: Secondary | ICD-10-CM | POA: Diagnosis not present

## 2022-01-23 DIAGNOSIS — E11628 Type 2 diabetes mellitus with other skin complications: Secondary | ICD-10-CM | POA: Diagnosis not present

## 2022-01-23 LAB — CBC WITH DIFFERENTIAL/PLATELET
Abs Immature Granulocytes: 0.36 10*3/uL — ABNORMAL HIGH (ref 0.00–0.07)
Basophils Absolute: 0.1 10*3/uL (ref 0.0–0.1)
Basophils Relative: 0 %
Eosinophils Absolute: 0.1 10*3/uL (ref 0.0–0.5)
Eosinophils Relative: 1 %
HCT: 32.4 % — ABNORMAL LOW (ref 36.0–46.0)
Hemoglobin: 9.6 g/dL — ABNORMAL LOW (ref 12.0–15.0)
Immature Granulocytes: 3 %
Lymphocytes Relative: 12 %
Lymphs Abs: 1.7 10*3/uL (ref 0.7–4.0)
MCH: 29.1 pg (ref 26.0–34.0)
MCHC: 29.6 g/dL — ABNORMAL LOW (ref 30.0–36.0)
MCV: 98.2 fL (ref 80.0–100.0)
Monocytes Absolute: 1 10*3/uL (ref 0.1–1.0)
Monocytes Relative: 7 %
Neutro Abs: 11.2 10*3/uL — ABNORMAL HIGH (ref 1.7–7.7)
Neutrophils Relative %: 77 %
Platelets: 373 10*3/uL (ref 150–400)
RBC: 3.3 MIL/uL — ABNORMAL LOW (ref 3.87–5.11)
RDW: 18.3 % — ABNORMAL HIGH (ref 11.5–15.5)
WBC: 14.3 10*3/uL — ABNORMAL HIGH (ref 4.0–10.5)
nRBC: 0 % (ref 0.0–0.2)

## 2022-01-23 LAB — BASIC METABOLIC PANEL
Anion gap: 8 (ref 5–15)
BUN: 62 mg/dL — ABNORMAL HIGH (ref 8–23)
CO2: 26 mmol/L (ref 22–32)
Calcium: 8.6 mg/dL — ABNORMAL LOW (ref 8.9–10.3)
Chloride: 105 mmol/L (ref 98–111)
Creatinine, Ser: 2.03 mg/dL — ABNORMAL HIGH (ref 0.44–1.00)
GFR, Estimated: 24 mL/min — ABNORMAL LOW (ref >60)
Glucose, Bld: 140 mg/dL — ABNORMAL HIGH (ref 70–99)
Potassium: 4.2 mmol/L (ref 3.5–5.1)
Sodium: 139 mmol/L (ref 135–145)

## 2022-01-23 LAB — SURGICAL PATHOLOGY

## 2022-01-23 LAB — GLUCOSE, CAPILLARY: Glucose-Capillary: 98 mg/dL (ref 70–99)

## 2022-01-23 MED ORDER — HYDRALAZINE HCL 25 MG PO TABS
25.0000 mg | ORAL_TABLET | Freq: Three times a day (TID) | ORAL | Status: DC
  Administered 2022-01-23 – 2022-01-25 (×6): 25 mg via ORAL
  Filled 2022-01-23 (×6): qty 1

## 2022-01-23 MED ORDER — APIXABAN 2.5 MG PO TABS
2.5000 mg | ORAL_TABLET | Freq: Two times a day (BID) | ORAL | Status: DC
  Administered 2022-01-23 – 2022-01-25 (×4): 2.5 mg via ORAL
  Filled 2022-01-23 (×4): qty 1

## 2022-01-23 MED ORDER — DOXYCYCLINE HYCLATE 100 MG PO TABS
100.0000 mg | ORAL_TABLET | Freq: Two times a day (BID) | ORAL | Status: DC
  Administered 2022-01-23 – 2022-01-25 (×4): 100 mg via ORAL
  Filled 2022-01-23 (×5): qty 1

## 2022-01-23 NOTE — Progress Notes (Signed)
  Subjective:  Patient ID: Kayla [name], female    DOB: [birth year],  MRN: [id number]  A 81 y.o. female presents with left fourth digit osteomyelitis status post left fourth digit amputation.  She states doing well no acute complaints no pain from the toe.  Denies nausea fever chills vomiting  Objective:   Vitals:   01/23/22 0701 01/23/22 0900  BP: (!) 171/60   Pulse: (!) 59   Resp: 18   Temp: 98 F (36.7 C)   SpO2: 94% 96%   General AA&O x3. Normal mood and affect.  Vascular Dorsalis pedis and posterior tibial pulses 2/4 bilat. Brisk capillary refill to all digits. Pedal hair present.  Neurologic Epicritic sensation grossly intact.  Dermatologic Bandages clean dry and intact.  No signs of infection noted.  No strikethrough noted.  No calf pain noted motor or sensory functions are intact  Orthopedic: MMT 5/5 in dorsiflexion, plantarflexion, inversion, and eversion. Normal joint ROM without pain or crepitus.    Assessment & Plan:  Patient was evaluated and treated and all questions answered.  Left fourth digit osteomyelitis status post fourth digit amputation postop day 1 -All questions and concerns were discussed with the patient in extensive detail -Patient is okay to be discharged from podiatric standpoint. -Weightbearing as tolerated in surgical shoe -Antibiotics 10 days of doxycycline -No dressing change until follow-up -Follow-up with me 1 week from discharge.  Felipa Furnace, DPM  Accessible via secure chat for questions or concerns.

## 2022-01-23 NOTE — Progress Notes (Signed)
PROGRESS NOTE    Kayla [name]  YQM:[id number] DOB: 04/23/1941 DOA: 01/20/2022 PCP: Jerline Pain, MD    Chief Complaint  Patient presents with   Nail Problem    Brief Narrative:  Kayla [name] is a 81 y.o. female with medical history significant of COPD and chronic hypoxemia on 3 L oxygen at home, obstructive sleep apnea on CPAP at night, sick sinus syndrome status post pacemaker, A-fib on amiodarone and metoprolol, therapeutic on Eliquis, insulin-dependent type 2 diabetes, chronic venous stasis pigmentation and lymphedema, amputation of the tip of the left great toe presents with pain and discharge from the tip of the left fourth toe.  This started after she clipped her toe about 2 weeks ago and she thinks she might have clipped some skin.  It continued to drain to the point now it is draining pus.  X-ray with destructed toe.  Case discussed with [facility], Dr. Boneta Lucks who requested MRI and ABI.  Assessment & Plan:   Principal Problem:   Toe osteomyelitis, left (HCC) Active Problems:   Insulin dependent type 2 diabetes mellitus ([place])   Hypertension associated with diabetes ([place])   Paroxysmal atrial fibrillation (HCC)   HYPOTHYROIDISM, POSTSURGICAL   Hyperlipidemia associated with type 2 diabetes mellitus (HCC)   OBSTRUCTIVE SLEEP APNEA   COPD (chronic obstructive pulmonary disease) (HCC)   Chronic kidney disease, stage 3b (HCC)   Leg swelling   Polymyalgia rheumatica (HCC)   Left 4 th toe osteomyelitis/ Diabetic toe ulcer: MRI of the foot confirming osteomyelitis.  Appreciate podiatry recommendations.  Continue with broad spectrum IV antibiotics. Improving leukocytosis. Possible transition to oral antibiotics in the next 24 hours.  CRP is 5.2, lactic acid is 1.4. MRSA detected. ABI's  done and reviewed.  Pain control with IV morphine and oxycodone.  Elevated legs.  Podiatry on board and underwent toe amputation on the 4 th digit.  Therapy eval  PENDING.     Type 2 DM, poorly controlled with diabetic neuropathy, insulin dependent , uncontrolled with hyperglycemia.  CBG (last 3)  Recent Labs    01/22/22 1747 01/22/22 2153 01/23/22 0746  GLUCAP 167* 259* 98    Resume insulin and SSI.  Continue with gabapentin . No changes in  meds   COPD WITH chronic hypoxemia on 3 lit of [place] oxygen, Chronic hypoxic respiratory failure In the setting of sleep apnea on CPAP at night.  No wheezing or rhonchi. But she reports worsening sob and right sided chest pain in the lateral aspect. Patient reports h/o pleurisy in the past.  Will get a 2 view CXR for further evaluation.  Resume home meds.    Uncontrolled hypertension partly from toe pain.  Increase amlodipine 10 mg daily, add lasix 60 mg IV BID, . Added Hydralazine 25 mg TID.     Stage 3 b CKD.  Creatinine at baseline.    Hyperlipidemia:  Resume statin.    Paroxysmal Atrial fibrillation  Rate controlled on amiodarone and metoprolol  Restarted eliquis today.   Hypothyroidism:  Resume synthroid.   Anemia of chronic disease:  Transfuse to keep hemoglobin greater than 7.  Hemoglobin is stable at 9.6%.   Body mass index is 41.09 kg/m. Morbid obesity. Recommend outpatient followup with PCP.    Acute on chronic diastolic heart failure:  - started on IV lasix. She is currently on 60 mg IV BID.  - she remains on 3 lit of [place] oxygen.  - diuresing well, about 3 Lit  negative  since admission    DVT prophylaxis: eliquis.  Code Status: Full code.  Family Communication: family at bedside.  Disposition:   Status is: Inpatient Remains inpatient appropriate because: IV antibiotics. IV pain control.    Level of care: Med-Surg Consultants:  Podiatry.   Procedures: MRI of the left foot.   Antimicrobials: Antibiotics Given (last 72 hours)     Date/Time Action Medication Dose Rate   01/20/22 1618 New Bag/Given   ceFEPIme (MAXIPIME) 2 g in sodium chloride 0.9 % 100 mL  IVPB 2 g 200 mL/hr   01/20/22 1845 New Bag/Given   vancomycin (VANCOREADY) IVPB 2000 mg/400 mL 2,000 mg 200 mL/hr   01/20/22 2345 Given   metroNIDAZOLE (FLAGYL) tablet 500 mg 500 mg    01/21/22 0946 Given   metroNIDAZOLE (FLAGYL) tablet 500 mg 500 mg    01/21/22 2012 Given   metroNIDAZOLE (FLAGYL) tablet 500 mg 500 mg    01/22/22 0855 Given   metroNIDAZOLE (FLAGYL) tablet 500 mg 500 mg    01/22/22 1511 New Bag/Given   ceFEPIme (MAXIPIME) 2 g in sodium chloride 0.9 % 100 mL IVPB 2 g 200 mL/hr   01/22/22 1832 New Bag/Given   vancomycin (VANCOCIN) IVPB 1000 mg/200 mL premix 1,000 mg 200 mL/hr   01/22/22 2053 Given   metroNIDAZOLE (FLAGYL) tablet 500 mg 500 mg    01/23/22 0941 Given   metroNIDAZOLE (FLAGYL) tablet 500 mg 500 mg          Subjective:  Right sided chest pain on the lateral aspect.  Toe pain on the left side.  No nausea vomiting and abdominal pain.  BM yesterday.    Objective: Vitals:   01/22/22 2106 01/23/22 0139 01/23/22 0701 01/23/22 0900  BP: (!) 174/68 (!) 187/64 (!) 171/60   Pulse: 62 60 (!) 59   Resp: '20 18 18   '$ Temp: 98 F (36.7 C) 97.8 F (36.6 C) 98 F (36.7 C)   TempSrc: Oral Axillary Axillary   SpO2: 94% 98% 94% 96%  Weight:      Height:        Intake/Output Summary (Last 24 hours) at 01/23/2022 1320 Last data filed at 01/23/2022 0757 Gross per 24 hour  Intake 1260 ml  Output 2050 ml  Net -790 ml    Filed Weights   01/20/22 1425 01/22/22 1107  Weight: 112.5 kg 112 kg    Examination:  General exam: Appears calm and comfortable  Respiratory system: Clear to auscultation. Respiratory effort normal. Cardiovascular system: S1 & S2 heard, RRR. No JVD, bilateral leg edema.  Gastrointestinal system: Abdomen is nondistended, soft and nontender.  Normal bowel sounds heard. Central nervous system: Alert and oriented. No focal neurological deficits. Extremities: left foot bandaged.  Skin: No rashes, Psychiatry:  Mood & affect appropriate.         Data Reviewed: I have personally reviewed following labs and imaging studies  CBC: Recent Labs  Lab 01/20/22 1532 01/21/22 0343 01/23/22 0357  WBC 18.9* 16.8* 14.3*  NEUTROABS 17.8*  --  11.2*  HGB 9.5* 8.8* 9.6*  HCT 30.7* 29.5* 32.4*  MCV 97.5 97.0 98.2  PLT 372 357 373     Basic Metabolic Panel: Recent Labs  Lab 01/20/22 1532 01/21/22 0343 01/22/22 0934 01/23/22 0357  NA 140 143 139 139  K 4.8 4.9 4.2 4.2  CL 107 108 107 105  CO2 '24 26 24 26  '$ GLUCOSE 184* 165* 83 140*  BUN 65* 66* 61* 62*  CREATININE 1.98*  2.12* 1.66* 2.03*  CALCIUM 8.4* 8.5* 8.4* 8.6*     GFR: Estimated Creatinine Clearance: 27.6 mL/min (A) (by C-G formula based on SCr of 2.03 mg/dL (H)).  Liver Function Tests: Recent Labs  Lab 01/20/22 1532  AST 19  ALT 24  ALKPHOS 59  BILITOT 0.3  PROT 6.3*  ALBUMIN 2.4*     CBG: Recent Labs  Lab 01/22/22 1318 01/22/22 1417 01/22/22 1747 01/22/22 2153 01/23/22 0746  GLUCAP 56* 104* 167* 259* 98      Recent Results (from the past 240 hour(s))  Blood Cultures x 2 sites     Status: None (Preliminary result)   Collection Time: 01/20/22  3:32 PM   Specimen: BLOOD  Result Value Ref Range Status   Specimen Description   Final    BLOOD LEFT ANTECUBITAL Performed at [facility], [place] [address], [place], [place] [address]    Special Requests   Final    BOTTLES DRAWN AEROBIC AND ANAEROBIC Blood Culture adequate volume Performed at [facility] [place] [address], [place], [place] [address]    Culture   Final    NO GROWTH 3 DAYS Performed at [facility], [place] [address] [place], [place] [address]    Report Status PENDING  Incomplete  Blood Cultures x 2 sites     Status: None (Preliminary result)   Collection Time: 01/20/22  3:55 PM   Specimen: BLOOD  Result Value Ref Range Status   Specimen Description   Final    BLOOD BLOOD LEFT ARM Performed at [facility] [address], [place], [place] [address]    Special Requests   Final    BOTTLES DRAWN AEROBIC AND ANAEROBIC Blood Culture results may not be optimal due to an inadequate volume of blood received in culture bottles Performed at [facility] [address] [place], [place] [address]    Culture   Final    NO GROWTH 3 DAYS Performed at [facility], [place] [address] [place], [place] [address]    Report Status PENDING  Incomplete  MRSA Next Gen by PCR, Nasal     Status: Abnormal   Collection Time: 01/20/22 11:23 PM   Specimen: Nasal Mucosa; Nasal Swab  Result Value Ref Range Status   MRSA by PCR Next Gen DETECTED (A) NOT DETECTED Final    Comment: CRITICAL RESULT CALLED TO, READ BACK BY AND VERIFIED WITH: ESTABEAN,B RN AT 0131 ON 01/21/22 BY [email or web address] (NOTE) The GeneXpert MRSA Assay (FDA approved for NASAL specimens only), is one component of a comprehensive MRSA colonization surveillance program. It is not intended to diagnose MRSA infection nor to guide or monitor treatment for MRSA infections. Test performance is not FDA approved in patients less than 38 years old. Performed at [facility], [place] [address], [place], [place] [address]          Radiology Studies: DG Foot Complete Left  Result Date: 01/22/2022 CLINICAL DATA:  Postoperative left foot EXAM: LEFT FOOT - COMPLETE 3+ VIEW COMPARISON:  Left foot radiographs 01/20/2022, MRI left foot 01/21/2022 FINDINGS: Interval amputation of the fourth toe phalanges. Unchanged small 5 mm less than 1 mm linear metallic foreign body overlying the medial fifth toe. Unchanged acute to subacute fracture of the medial and lateral aspects of the base of the proximal phalanx of the second toe. Unchanged remote amputation of the distal phalanx of the great toe. Moderate plantar calcaneal heel spur.  IMPRESSION: Compared to 01/20/2022: 1. Interval amputation of the fourth toe phalanges. 2. Otherwise, no  significant change from prior. 3. Acute to subacute fracture of the base of the proximal phalanx of the second toe. Electronically Signed   By: Yvonne Kendall M.D.   On: 01/22/2022 14:03        Scheduled Meds:  albuterol  2.5 mg Nebulization BID   allopurinol  200 mg Oral Daily   amiodarone  200 mg Oral Daily   amLODipine  10 mg Oral Daily   ascorbic acid  500 mg Oral Q lunch   atorvastatin  40 mg Oral Daily   Chlorhexidine Gluconate Cloth  6 each Topical Q0600   vitamin D3  1,000 Units Oral q morning   cyanocobalamin  1,000 mcg Oral Daily   fluticasone  1 spray Each Nare BID   furosemide  60 mg Intravenous BID   gabapentin  300 mg Oral TID   insulin aspart  0-15 Units Subcutaneous TID WC   insulin aspart  0-5 Units Subcutaneous QHS   insulin glargine-yfgn  25 Units Subcutaneous BID   levothyroxine  200 mcg Oral QAC breakfast   metoprolol succinate  100 mg Oral Daily   metroNIDAZOLE  500 mg Oral Q12H   mometasone-formoterol  2 puff Inhalation BID   And   umeclidinium bromide  1 puff Inhalation Daily   mupirocin ointment  1 Application Nasal BID   Continuous Infusions:  ceFEPime (MAXIPIME) IV 2 g (01/22/22 1511)   vancomycin 1,000 mg (01/22/22 1832)     LOS: 3 days       Hosie Poisson, MD [facility]   To contact the attending provider between 7A-7P or the covering provider during after hours 7P-7A, please log into the web site [email or web address] and access using universal Home password for that web site. If you do not have the password, please call the hospital operator.  01/23/2022, 1:20 PM

## 2022-01-23 NOTE — Inpatient Diabetes Management (Signed)
Inpatient Diabetes Program Recommendations  AACE/ADA: New Consensus Statement on Inpatient Glycemic Control (2015)  Target Ranges:  Prepandial:   less than 140 mg/dL      Peak postprandial:   less than 180 mg/dL (1-2 hours)      Critically ill patients:  140 - 180 mg/dL   Lab Results  Component Value Date   GLUCAP 98 01/23/2022   HGBA1C 8.4 (H) 05/30/2021    Review of Glycemic Control  Diabetes history: DM2 Outpatient Diabetes medications: Lantus 90 in am and 50 units QHS, Novolog 10-17 units TID Current orders for Inpatient glycemic control: Semglee 25 units BID, Novolog 0-15 TID with meals and 0-5 HS  HgbA1C - 8.4% on 05/30/21 Mild hypoglycemia in past couple of days  Inpatient Diabetes Program Recommendations:    Decrease Semglee to 24 units BID  Add Novolog 3 units TID with meals if eating > 50%.  Continue to follow.  Thank you. Lorenda Peck, RD, LDN, [place] Inpatient Diabetes Coordinator [phone]

## 2022-01-23 NOTE — Progress Notes (Signed)
ANTICOAGULATION CONSULT NOTE - Follow Up Consult  Pharmacy Consult for apixaban Indication: atrial fibrillation  Allergies  Allergen Reactions   Dulaglutide Nausea Only, Other (See Comments) and Cough    TRULICITY made the patient sick to her stomach (only) several days after using it   Oxycodone Itching and Other (See Comments)    Pt still takes this med even thought it causes itching   Trelegy Ellipta [Fluticasone-Umeclidin-Vilant] Nausea And Vomiting, Other (See Comments) and Cough    Made the patient sick to her stomach (only) several days after using it     Patient Measurements: Height: '5\' 5"'$  (165.1 cm) Weight: 112 kg (246 lb 14.6 oz) IBW/kg (Calculated) : 57  Vital Signs: Temp: 98 F (36.7 C) (09/22 0701) Temp Source: Axillary (09/22 0701) BP: 171/60 (09/22 0701) Pulse Rate: 59 (09/22 0701)  Labs: Recent Labs    01/20/22 1532 01/21/22 0343 01/22/22 0934 01/23/22 0357  HGB 9.5* 8.8*  --  9.6*  HCT 30.7* 29.5*  --  32.4*  PLT 372 357  --  373  CREATININE 1.98* 2.12* 1.66* 2.03*    Estimated Creatinine Clearance: 27.6 mL/min (A) (by C-G formula based on SCr of 2.03 mg/dL (H)).   Medications:  Scheduled:   albuterol  2.5 mg Nebulization BID   allopurinol  200 mg Oral Daily   amiodarone  200 mg Oral Daily   amLODipine  10 mg Oral Daily   ascorbic acid  500 mg Oral Q lunch   atorvastatin  40 mg Oral Daily   Chlorhexidine Gluconate Cloth  6 each Topical Q0600   vitamin D3  1,000 Units Oral q morning   cyanocobalamin  1,000 mcg Oral Daily   fluticasone  1 spray Each Nare BID   furosemide  60 mg Intravenous BID   gabapentin  300 mg Oral TID   hydrALAZINE  25 mg Oral Q8H   insulin aspart  0-15 Units Subcutaneous TID WC   insulin aspart  0-5 Units Subcutaneous QHS   insulin glargine-yfgn  25 Units Subcutaneous BID   levothyroxine  200 mcg Oral QAC breakfast   metoprolol succinate  100 mg Oral Daily   metroNIDAZOLE  500 mg Oral Q12H   mometasone-formoterol  2  puff Inhalation BID   And   umeclidinium bromide  1 puff Inhalation Daily   mupirocin ointment  1 Application Nasal BID    Assessment: 81 y/o F on apixaban 2.5 mg BID prior to admission for atrial fibrillation, med history indicated last taken 9/19 at 0800.  Underwent amputation of L 4th toe on 9/22 at 12:52 pm. Now has orders from attending physician to resume apixaban with pharmacy dosing assistance  PTA apixaban dosage appropriate given patient's age and SCr.  Plan:  Resume apixaban 2.5 mg BID tonight  Clayburn Pert, PharmD, [place] [phone] 01/23/2022  2:11 PM

## 2022-01-23 NOTE — Evaluation (Signed)
Physical Therapy One Time Evaluation Patient Details Name: Kayla [name] MRN: [id number] DOB: [birth year] Today's Date: 01/23/2022  History of Present Illness  81 y.o. female with medical history significant of COPD and chronic hypoxemia on 3 L oxygen at home, obstructive sleep apnea on CPAP at night, sick sinus syndrome status post pacemaker, A-fib on amiodarone and metoprolol, therapeutic on Eliquis, insulin-dependent type 2 diabetes, chronic venous stasis pigmentation and lymphedema, s/p I&D right knee septic bursitis 07/22/21 and hx of amputation of the tip of the left great toe presents with pain and discharge from the tip of the left fourth toe and s/p Left fourth digit amputation due to significant osteomyelitic changes noted to the as well as some purulent drainage on 01/22/22.  Clinical Impression  Patient evaluated by Physical Therapy with no further acute PT needs identified. All education has been completed and the patient has no further questions.  Pt assisted to bathroom and then ambulated in hallway.  Pt feels at her baseline in regards to mobility.  Pt encouraged to get up to bathroom and ambulate with staff during acute stay. See below for any follow-up Physical Therapy or equipment needs. PT is signing off. Thank you for this referral.        Recommendations for follow up therapy are one component of a multi-disciplinary discharge planning process, led by the attending physician.  Recommendations may be updated based on patient status, additional functional criteria and insurance authorization.  Follow Up Recommendations No PT follow up      Assistance Recommended at Discharge PRN  Patient can return home with the following  Help with stairs or ramp for entrance    Equipment Recommendations None recommended by PT  Recommendations for Other Services       Functional Status Assessment Patient has not had a recent decline in their functional status     Precautions /  Restrictions Precautions Precautions: Fall Precaution Comments: chronic 3L O2 Restrictions Weight Bearing Restrictions: No Other Position/Activity Restrictions: WBAT with post op shoe      Mobility  Bed Mobility               General bed mobility comments: pt sitting EOB on arrival    Transfers Overall transfer level: Needs assistance Equipment used: Rolling walker (2 wheels) Transfers: Sit to/from Stand Sit to Stand: Min guard, Supervision           General transfer comment: no assist required    Ambulation/Gait Ambulation/Gait assistance: Min guard, Supervision Gait Distance (Feet): 120 Feet Assistive device: Rolling walker (2 wheels) Gait Pattern/deviations: Step-through pattern, Decreased stride length, Antalgic, Decreased stance time - left       General Gait Details: WBAT in post op shoe however post op shoe appears too small (called ortho tech and requested recheck); pt steady with RW and denies increased pain, remained on baseline 3L O2 [place]  Stairs            Wheelchair Mobility    Modified Rankin (Stroke Patients Only)       Balance Overall balance assessment: History of Falls (fell on her outside steps at home prior to admission)                                           Pertinent Vitals/Pain Pain Assessment Pain Assessment: 0-10 Pain Score: 6  Pain Location: right ribs from previous fall  prior to this admission Pain Descriptors / Indicators: Sore Pain Intervention(s): Repositioned, Monitored during session    [place] expects to be discharged to:: Private residence Living Arrangements: Spouse/significant other Available Help at Discharge: Family;Available 24 hours/day Type of Home: House Home Access: Stairs to enter Entrance Stairs-Rails: Right Entrance Stairs-Number of Steps: 2   Home Layout: One level Home Equipment: Rollator (4 wheels);Rolling Walker (2 wheels);Cane - single point;Shower  seat      Prior Function Prior Level of Function : Needs assist             Mobility Comments: Uses a walker for mobility, 3L O2 baseline, had a fall prior to admission on her stairs at home and states no fractures however has painful right ribs       Hand Dominance        Extremity/Trunk Assessment        Lower Extremity Assessment Lower Extremity Assessment: Generalized weakness    Cervical / Trunk Assessment Cervical / Trunk Assessment: Normal  Communication   Communication: No difficulties  Cognition Arousal/Alertness: Awake/alert Behavior During Therapy: WFL for tasks assessed/performed Overall Cognitive Status: Within Functional Limits for tasks assessed                                          General Comments      Exercises     Assessment/Plan    PT Assessment Patient does not need any further PT services  PT Problem List         PT Treatment Interventions      PT Goals (Current goals can be found in the Care Plan section)  Acute Rehab PT Goals PT Goal Formulation: All assessment and education complete, DC therapy    Frequency       Co-evaluation               AM-PAC PT "6 Clicks" Mobility  Outcome Measure Help needed turning from your back to your side while in a flat bed without using bedrails?: None Help needed moving from lying on your back to sitting on the side of a flat bed without using bedrails?: None Help needed moving to and from a bed to a chair (including a wheelchair)?: None Help needed standing up from a chair using your arms (e.g., wheelchair or bedside chair)?: A Little Help needed to walk in hospital room?: A Little Help needed climbing 3-5 steps with a railing? : A Little 6 Click Score: 21    End of Session Equipment Utilized During Treatment: Gait belt Activity Tolerance: Patient tolerated treatment well Patient left: with call bell/phone within reach;with family/visitor present;in chair;with  chair alarm set Nurse Communication: Mobility status PT Visit Diagnosis: Difficulty in walking, not elsewhere classified (R26.2)    Time: 1000-1021 PT Time Calculation (min) (ACUTE ONLY): 21 min   Charges:   PT Evaluation $PT Eval Low Complexity: 1 Low        Kati PT, DPT Physical Therapist Acute Rehabilitation Services Preferred contact method: Secure Chat Weekend Pager Only: [phone] Office: [place] 01/23/2022, 1:54 PM

## 2022-01-24 DIAGNOSIS — E1169 Type 2 diabetes mellitus with other specified complication: Secondary | ICD-10-CM | POA: Diagnosis not present

## 2022-01-24 DIAGNOSIS — N1832 Chronic kidney disease, stage 3b: Secondary | ICD-10-CM | POA: Diagnosis not present

## 2022-01-24 DIAGNOSIS — M869 Osteomyelitis, unspecified: Secondary | ICD-10-CM | POA: Diagnosis not present

## 2022-01-24 DIAGNOSIS — E11628 Type 2 diabetes mellitus with other skin complications: Secondary | ICD-10-CM | POA: Diagnosis not present

## 2022-01-24 LAB — CBC WITH DIFFERENTIAL/PLATELET
Abs Immature Granulocytes: 0.21 10*3/uL — ABNORMAL HIGH (ref 0.00–0.07)
Basophils Absolute: 0 10*3/uL (ref 0.0–0.1)
Basophils Relative: 0 %
Eosinophils Absolute: 0.1 10*3/uL (ref 0.0–0.5)
Eosinophils Relative: 1 %
HCT: 29.6 % — ABNORMAL LOW (ref 36.0–46.0)
Hemoglobin: 8.8 g/dL — ABNORMAL LOW (ref 12.0–15.0)
Immature Granulocytes: 2 %
Lymphocytes Relative: 15 %
Lymphs Abs: 1.7 10*3/uL (ref 0.7–4.0)
MCH: 29.4 pg (ref 26.0–34.0)
MCHC: 29.7 g/dL — ABNORMAL LOW (ref 30.0–36.0)
MCV: 99 fL (ref 80.0–100.0)
Monocytes Absolute: 0.7 10*3/uL (ref 0.1–1.0)
Monocytes Relative: 6 %
Neutro Abs: 9 10*3/uL — ABNORMAL HIGH (ref 1.7–7.7)
Neutrophils Relative %: 76 %
Platelets: 342 10*3/uL (ref 150–400)
RBC: 2.99 MIL/uL — ABNORMAL LOW (ref 3.87–5.11)
RDW: 18.2 % — ABNORMAL HIGH (ref 11.5–15.5)
WBC: 11.8 10*3/uL — ABNORMAL HIGH (ref 4.0–10.5)
nRBC: 0.2 % (ref 0.0–0.2)

## 2022-01-24 LAB — BASIC METABOLIC PANEL
Anion gap: 9 (ref 5–15)
BUN: 62 mg/dL — ABNORMAL HIGH (ref 8–23)
CO2: 24 mmol/L (ref 22–32)
Calcium: 8.3 mg/dL — ABNORMAL LOW (ref 8.9–10.3)
Chloride: 105 mmol/L (ref 98–111)
Creatinine, Ser: 2.12 mg/dL — ABNORMAL HIGH (ref 0.44–1.00)
GFR, Estimated: 23 mL/min — ABNORMAL LOW (ref >60)
Glucose, Bld: 188 mg/dL — ABNORMAL HIGH (ref 70–99)
Potassium: 4.4 mmol/L (ref 3.5–5.1)
Sodium: 138 mmol/L (ref 135–145)

## 2022-01-24 NOTE — Progress Notes (Signed)
PROGRESS NOTE    Kayla [name]  MGQ:[id number] DOB: 02/13/1941 DOA: 01/20/2022 PCP: Jerline Pain, MD    Chief Complaint  Patient presents with   Nail Problem    Brief Narrative:  Kayla [name] is a 81 y.o. female with medical history significant of COPD and chronic hypoxemia on 3 L oxygen at home, obstructive sleep apnea on CPAP at night, sick sinus syndrome status post pacemaker, A-fib on amiodarone and metoprolol, therapeutic on Eliquis, insulin-dependent type 2 diabetes, chronic venous stasis pigmentation and lymphedema, amputation of the tip of the left great toe presents with pain and discharge from the tip of the left fourth toe.  This started after she clipped her toe about 2 weeks ago and she thinks she might have clipped some skin.  It continued to drain to the point now it is draining pus.  X-ray with destructed toe.  Case discussed with [facility], Dr. Boneta Lucks who requested MRI and ABI.  Assessment & Plan:   Principal Problem:   Toe osteomyelitis, left (HCC) Active Problems:   Insulin dependent type 2 diabetes mellitus ([place])   Hypertension associated with diabetes ([place])   Paroxysmal atrial fibrillation (HCC)   HYPOTHYROIDISM, POSTSURGICAL   Hyperlipidemia associated with type 2 diabetes mellitus (HCC)   OBSTRUCTIVE SLEEP APNEA   COPD (chronic obstructive pulmonary disease) (HCC)   Chronic kidney disease, stage 3b (HCC)   Leg swelling   Polymyalgia rheumatica (HCC)   Left 4 th toe osteomyelitis/ Diabetic toe ulcer: MRI of the foot confirming osteomyelitis.  Appreciate podiatry recommendations.  Continue with broad spectrum IV antibiotics. Improving leukocytosis. Possible transition to oral antibiotics in the next 24 hours.  CRP is 5.2, lactic acid is 1.4. MRSA detected. ABI's  done and reviewed.  Pain control with IV morphine and oxycodone.  Elevated legs.  Podiatry on board and underwent toe amputation on the 4 th digit.  Therapy eval  done, no PT follow up.     Type 2 DM, poorly controlled with diabetic neuropathy, insulin dependent , uncontrolled with hyperglycemia.  CBG (last 3)  Recent Labs    01/22/22 1747 01/22/22 2153 01/23/22 0746  GLUCAP 167* 259* 98    Resume insulin and SSI.  Continue with gabapentin . No changes in  meds   COPD WITH chronic hypoxemia on 3 lit of [place] oxygen, Chronic hypoxic respiratory failure In the setting of sleep apnea on CPAP at night.  No wheezing or rhonchi. But she reports worsening sob and right sided chest pain in the lateral aspect. Patient reports h/o pleurisy in the past.  CXR is negative Resume home meds.    Uncontrolled hypertension partly from toe pain.  Increase amlodipine 10 mg daily, add lasix 60 mg IV BID, . Added Hydralazine 25 mg TID.     Stage 3 b CKD.  Creatinine at baseline.    Hyperlipidemia:  Resume statin.    Paroxysmal Atrial fibrillation  Rate controlled on amiodarone and metoprolol  Restarted eliquis   Hypothyroidism:  Resume synthroid.   Anemia of chronic disease:  Transfuse to keep hemoglobin greater than 7.  Hemoglobin is stable at 9.6%.   Body mass index is 41.09 kg/m. Morbid obesity. Recommend outpatient followup with PCP.    Acute on chronic diastolic heart failure:  - started on IV lasix. She is currently on 60 mg IV BID.  - she remains on 3 lit of [place] oxygen.  - diuresing well, about 4.5 Lit negative  since admission -  pt is on 80 mg of torsemide at home, she reports her home diuretic is not working, - will request cardiology input in am.     DVT prophylaxis: eliquis.  Code Status: Full code.  Family Communication: family at bedside.  Disposition:   Status is: Inpatient Remains inpatient appropriate because: IV antibiotics. IV pain control.    Level of care: Med-Surg Consultants:  Podiatry.   Procedures: MRI of the left foot.   Antimicrobials: Antibiotics Given (last 72 hours)     Date/Time Action  Medication Dose Rate   01/21/22 2012 Given   metroNIDAZOLE (FLAGYL) tablet 500 mg 500 mg    01/22/22 0855 Given   metroNIDAZOLE (FLAGYL) tablet 500 mg 500 mg    01/22/22 1511 New Bag/Given   ceFEPIme (MAXIPIME) 2 g in sodium chloride 0.9 % 100 mL IVPB 2 g 200 mL/hr   01/22/22 1832 New Bag/Given   vancomycin (VANCOCIN) IVPB 1000 mg/200 mL premix 1,000 mg 200 mL/hr   01/22/22 2053 Given   metroNIDAZOLE (FLAGYL) tablet 500 mg 500 mg    01/23/22 0941 Given   metroNIDAZOLE (FLAGYL) tablet 500 mg 500 mg    01/23/22 2038 Given   doxycycline (VIBRA-TABS) tablet 100 mg 100 mg    01/24/22 0855 Given   doxycycline (VIBRA-TABS) tablet 100 mg 100 mg          Subjective:  Pain is better controlled. Still has significant pedal edema.   Objective: Vitals:   01/23/22 2157 01/24/22 0439 01/24/22 0816 01/24/22 1343  BP: (!) 163/63 (!) 178/61  (!) 137/108  Pulse: 62 61  (!) 59  Resp: '18 20  17  '$ Temp:  98.3 F (36.8 C)  98.3 F (36.8 C)  TempSrc:  Oral  Oral  SpO2: 97% 95% (!) 86% 100%  Weight:      Height:        Intake/Output Summary (Last 24 hours) at 01/24/2022 1707 Last data filed at 01/24/2022 1656 Gross per 24 hour  Intake 540 ml  Output 1800 ml  Net -1260 ml    Filed Weights   01/20/22 1425 01/22/22 1107  Weight: 112.5 kg 112 kg    Examination:  General exam: Appears calm and comfortable  Respiratory system: Clear to auscultation. Respiratory effort normal. Cardiovascular system: S1 & S2 heard, RRR. No JVD,  Gastrointestinal system: Abdomen is nondistended, soft and nontender.  Normal bowel sounds heard. Central nervous system: Alert and oriented. No focal neurological deficits. Extremities: pedal edema 2+ Skin: left foot bandaged.  Psychiatry:Mood & affect appropriate.        Data Reviewed: I have personally reviewed following labs and imaging studies  CBC: Recent Labs  Lab 01/20/22 1532 01/21/22 0343 01/23/22 0357 01/24/22 0318  WBC 18.9* 16.8* 14.3*  11.8*  NEUTROABS 17.8*  --  11.2* 9.0*  HGB 9.5* 8.8* 9.6* 8.8*  HCT 30.7* 29.5* 32.4* 29.6*  MCV 97.5 97.0 98.2 99.0  PLT 372 357 373 342     Basic Metabolic Panel: Recent Labs  Lab 01/20/22 1532 01/21/22 0343 01/22/22 0934 01/23/22 0357 01/24/22 0318  NA 140 143 139 139 138  K 4.8 4.9 4.2 4.2 4.4  CL 107 108 107 105 105  CO2 '24 26 24 26 24  '$ GLUCOSE 184* 165* 83 140* 188*  BUN 65* 66* 61* 62* 62*  CREATININE 1.98* 2.12* 1.66* 2.03* 2.12*  CALCIUM 8.4* 8.5* 8.4* 8.6* 8.3*     GFR: Estimated Creatinine Clearance: 26.4 mL/min (A) (by C-G formula based on SCr  of 2.12 mg/dL (H)).  Liver Function Tests: Recent Labs  Lab 01/20/22 1532  AST 19  ALT 24  ALKPHOS 59  BILITOT 0.3  PROT 6.3*  ALBUMIN 2.4*     CBG: Recent Labs  Lab 01/22/22 1318 01/22/22 1417 01/22/22 1747 01/22/22 2153 01/23/22 0746  GLUCAP 56* 104* 167* 259* 98      Recent Results (from the past 240 hour(s))  Blood Cultures x 2 sites     Status: None (Preliminary result)   Collection Time: 01/20/22  3:32 PM   Specimen: BLOOD  Result Value Ref Range Status   Specimen Description   Final    BLOOD LEFT ANTECUBITAL Performed at [facility], [place] [address], [place], [place] [address]    Special Requests   Final    BOTTLES DRAWN AEROBIC AND ANAEROBIC Blood Culture adequate volume Performed at [place] [address] [place], [place] [address]    Culture   Final    NO GROWTH 4 DAYS Performed at [facility], [place] [address], [place], [place] [address]    Report Status PENDING  Incomplete  Blood Cultures x 2 sites     Status: None (Preliminary result)   Collection Time: 01/20/22  3:55 PM   Specimen: BLOOD  Result Value Ref Range Status   Specimen Description   Final    BLOOD BLOOD LEFT ARM Performed at [facility] [address], [place], [place] [address]    Special Requests   Final    BOTTLES DRAWN AEROBIC AND  ANAEROBIC Blood Culture results may not be optimal due to an inadequate volume of blood received in culture bottles Performed at [facility] [address] [place], [place] [address]    Culture   Final    NO GROWTH 4 DAYS Performed at [facility], [place] [address], [place], [place] [address]    Report Status PENDING  Incomplete  MRSA Next Gen by PCR, Nasal     Status: Abnormal   Collection Time: 01/20/22 11:23 PM   Specimen: Nasal Mucosa; Nasal Swab  Result Value Ref Range Status   MRSA by PCR Next Gen DETECTED (A) NOT DETECTED Final    Comment: CRITICAL RESULT CALLED TO, READ BACK BY AND VERIFIED WITH: ESTABEAN,B RN AT 0131 ON 01/21/22 BY [email or web address] (NOTE) The GeneXpert MRSA Assay (FDA approved for NASAL specimens only), is one component of a comprehensive MRSA colonization surveillance program. It is not intended to diagnose MRSA infection nor to guide or monitor treatment for MRSA infections. Test performance is not FDA approved in patients less than 61 years old. Performed at [facility], [facility], [place] [address], [place], [place] [address]          Radiology Studies: DG Chest 2 View  Result Date: 01/23/2022 CLINICAL DATA:  Shortness of breath. EXAM: CHEST - 2 VIEW COMPARISON:  Chest x-ray dated 12/12/2021. FINDINGS: Borderline cardiomegaly. LEFT chest wall pacemaker/ICD apparatus appears stable in position. Lungs are clear. No pleural effusion or pneumothorax is seen. No acute-appearing osseous abnormality. IMPRESSION: No active cardiopulmonary disease. No evidence of pneumonia or pulmonary edema. Borderline cardiomegaly. Electronically Signed   By: Franki Cabot M.D.   On: 01/23/2022 15:35        Scheduled Meds:  albuterol  2.5 mg Nebulization BID   allopurinol  200 mg Oral Daily   amiodarone  200 mg Oral Daily   amLODipine  10 mg Oral Daily  apixaban  2.5 mg Oral BID   ascorbic acid  500 mg Oral Q lunch   atorvastatin  40 mg Oral  Daily   Chlorhexidine Gluconate Cloth  6 each Topical Q0600   vitamin D3  1,000 Units Oral q morning   cyanocobalamin  1,000 mcg Oral Daily   doxycycline  100 mg Oral Q12H   fluticasone  1 spray Each Nare BID   furosemide  60 mg Intravenous BID   gabapentin  300 mg Oral TID   hydrALAZINE  25 mg Oral Q8H   insulin aspart  0-15 Units Subcutaneous TID WC   insulin aspart  0-5 Units Subcutaneous QHS   insulin glargine-yfgn  25 Units Subcutaneous BID   levothyroxine  200 mcg Oral QAC breakfast   metoprolol succinate  100 mg Oral Daily   mometasone-formoterol  2 puff Inhalation BID   And   umeclidinium bromide  1 puff Inhalation Daily   mupirocin ointment  1 Application Nasal BID   Continuous Infusions:     LOS: 4 days       Hosie Poisson, MD [facility]   To contact the attending provider between 7A-7P or the covering provider during after hours 7P-7A, please log into the web site [email or web address] and access using universal [place] password for that web site. If you do not have the password, please call the hospital operator.  01/24/2022, 5:07 PM

## 2022-01-24 NOTE — Progress Notes (Signed)
Mobility Specialist Cancellation/Refusal Note:     01/24/22 1222  Mobility  Activity Refused mobility     Reason for Cancellation/Refusal: Pt declined mobility at this time. Pt eating lunch at this time. Will check back as schedule permits.     [facility]

## 2022-01-25 DIAGNOSIS — J449 Chronic obstructive pulmonary disease, unspecified: Secondary | ICD-10-CM

## 2022-01-25 DIAGNOSIS — M7989 Other specified soft tissue disorders: Secondary | ICD-10-CM

## 2022-01-25 DIAGNOSIS — G4733 Obstructive sleep apnea (adult) (pediatric): Secondary | ICD-10-CM

## 2022-01-25 LAB — CBC WITH DIFFERENTIAL/PLATELET
Abs Immature Granulocytes: 0.24 10*3/uL — ABNORMAL HIGH (ref 0.00–0.07)
Basophils Absolute: 0.1 10*3/uL (ref 0.0–0.1)
Basophils Relative: 1 %
Eosinophils Absolute: 0.1 10*3/uL (ref 0.0–0.5)
Eosinophils Relative: 1 %
HCT: 31.2 % — ABNORMAL LOW (ref 36.0–46.0)
Hemoglobin: 9.3 g/dL — ABNORMAL LOW (ref 12.0–15.0)
Immature Granulocytes: 2 %
Lymphocytes Relative: 14 %
Lymphs Abs: 1.6 10*3/uL (ref 0.7–4.0)
MCH: 29.2 pg (ref 26.0–34.0)
MCHC: 29.8 g/dL — ABNORMAL LOW (ref 30.0–36.0)
MCV: 97.8 fL (ref 80.0–100.0)
Monocytes Absolute: 0.9 10*3/uL (ref 0.1–1.0)
Monocytes Relative: 7 %
Neutro Abs: 8.5 10*3/uL — ABNORMAL HIGH (ref 1.7–7.7)
Neutrophils Relative %: 75 %
Platelets: 398 10*3/uL (ref 150–400)
RBC: 3.19 MIL/uL — ABNORMAL LOW (ref 3.87–5.11)
RDW: 17.6 % — ABNORMAL HIGH (ref 11.5–15.5)
WBC: 11.4 10*3/uL — ABNORMAL HIGH (ref 4.0–10.5)
nRBC: 0 % (ref 0.0–0.2)

## 2022-01-25 LAB — CULTURE, BLOOD (ROUTINE X 2)
Culture: NO GROWTH
Culture: NO GROWTH
Special Requests: ADEQUATE

## 2022-01-25 LAB — BASIC METABOLIC PANEL
Anion gap: 8 (ref 5–15)
BUN: 65 mg/dL — ABNORMAL HIGH (ref 8–23)
CO2: 27 mmol/L (ref 22–32)
Calcium: 8.5 mg/dL — ABNORMAL LOW (ref 8.9–10.3)
Chloride: 102 mmol/L (ref 98–111)
Creatinine, Ser: 1.89 mg/dL — ABNORMAL HIGH (ref 0.44–1.00)
GFR, Estimated: 27 mL/min — ABNORMAL LOW (ref >60)
Glucose, Bld: 193 mg/dL — ABNORMAL HIGH (ref 70–99)
Potassium: 4.6 mmol/L (ref 3.5–5.1)
Sodium: 137 mmol/L (ref 135–145)

## 2022-01-25 LAB — GLUCOSE, CAPILLARY
Glucose-Capillary: 202 mg/dL — ABNORMAL HIGH (ref 70–99)
Glucose-Capillary: 312 mg/dL — ABNORMAL HIGH (ref 70–99)

## 2022-01-25 MED ORDER — DOXYCYCLINE HYCLATE 100 MG PO TABS: 100.0000 mg | ORAL_TABLET | Freq: Two times a day (BID) | ORAL | 0 refills | Status: AC

## 2022-01-25 MED ORDER — AMLODIPINE BESYLATE 10 MG PO TABS: 10.0000 mg | ORAL_TABLET | Freq: Every day | ORAL | 3 refills | Status: DC

## 2022-01-25 MED ORDER — TORSEMIDE 20 MG PO TABS: 100.0000 mg | ORAL_TABLET | Freq: Every day | ORAL | 3 refills | Status: DC

## 2022-01-25 NOTE — Plan of Care (Signed)
  Problem: Coping: Goal: Ability to adjust to condition or change in health will improve Outcome: Progressing   Problem: Pain Managment: Goal: General experience of comfort will improve Outcome: Progressing   

## 2022-01-26 ENCOUNTER — Ambulatory Visit: Payer: Medicare HMO

## 2022-01-26 ENCOUNTER — Telehealth: Payer: Self-pay | Admitting: *Deleted

## 2022-01-26 LAB — GLUCOSE, CAPILLARY
Glucose-Capillary: 169 mg/dL — ABNORMAL HIGH (ref 70–99)
Glucose-Capillary: 156 mg/dL — ABNORMAL HIGH (ref 70–99)
Glucose-Capillary: 240 mg/dL — ABNORMAL HIGH (ref 70–99)
Glucose-Capillary: 143 mg/dL — ABNORMAL HIGH (ref 70–99)
Glucose-Capillary: 236 mg/dL — ABNORMAL HIGH (ref 70–99)
Glucose-Capillary: 299 mg/dL — ABNORMAL HIGH (ref 70–99)
Glucose-Capillary: 190 mg/dL — ABNORMAL HIGH (ref 70–99)

## 2022-01-26 NOTE — Chronic Care Management (AMB) (Signed)
  Care Coordination  Outreach Note  01/26/2022 Name: Kayla [name] MRN: [id number] DOB: Jun 19, 1940   Care Coordination Outreach Attempts: An unsuccessful telephone outreach was attempted today to offer the patient information about available care coordination services as a benefit of their health plan.   Rescheduling   Follow Up Plan:  Additional outreach attempts will be made to offer the patient care coordination information and services.   Encounter Outcome:  No Answer  Julian Hy, [place] Direct Dial: [phone]

## 2022-01-27 NOTE — Progress Notes (Unsigned)
Office Visit    Patient Name: Kayla [name] Date of Encounter: 01/29/2022  PCP:  Jerline Pain, MD   [place]  Cardiologist:  Candee Furbish, MD  Advanced Practice Provider:  No care team member to display Electrophysiologist:  Virl Axe, MD   HPI    Kayla [name] is a 81 y.o. female with a past medical history significant for CAD (mild per remote cath 2006), anxiety, CHF, COPD, chronic kidney disease, diabetes mellitus type 2, hyperlipidemia, hypertension, hypothyroidism, OSA on CPAP, paroxysmal atrial fibrillation, pulmonary hypertension presents today for hospital follow-up.  She was last seen by Dr. Marlou Porch 09/02/2021.  At that appointment she had recently been admitted to the hospital 07/21/2021 for recurrent septic prepatellar bursitis of the right knee.  Empiric antibiotics were initiated and orthopedic surgery was consulted.  Patient underwent debridement of right knee prepatellar space including combined 5 to 6 inch incision.  Hemovac drain was placed 3/21 and removed 3/23.  ID was consulted and recommended 6 weeks of IV antibiotics.  Wound cultures were positive for MRSA.  Discharged 07/27/2021 on 6 weeks of daptomycin.  Losartan was held due to AKI.  Previously seen for chronic diastolic heart failure, [employer] pacemaker, and moderate CAD by Dr. Caryl Comes.  She had bouts of bursitis, cellulitis and was hospitalized November 2022 for that issue.  Had many complaints: Shortness of breath and fatigue.  Echocardiogram with EF 65%.  No bleeding episodes.  When she was seen back in May 2023 by Dr. Marlou Porch she believes that her right leg had been doing better but still appeared erythematous.  She also suffered from significant right knee pain.  She was on oxygen.  She was complaining of shortness of breath on minimal exertion including walking around her house.  Sometimes her oxygen was dropping down to 85% while walking.  Also, was having some congestion and  cough.  Recently seen in the hospital 01/20/2022.  She was clipping her toenails a couple of weeks ago and and subsequently developed increasing redness and pain and drainage from the site.  She was not on any antibiotics but was recently on a course of steroids for COPD exacerbation.  Of note, she endorsed chronic numbness and tingling at the base of her toes and feet diabetic neuropathy.  She was admitted for diabetic foot infection likely suspected associated with osteomyelitis.  Today, she tells me that her weight has been stable but she has noticed some swelling in her lower extremity and hands since discharge from hospital.  She was discharged on the 100 mg of Demadex and she does not feel like it is making much of a difference.  She also is having some shortness of breath and some chest pain at times.  The pain in her chest comes on when she moves around.  She was on antibiotics for her leg but is not on any at the moment.  She recently had her left toe amputated in the hospital and is in a boot today.  She has not been able to tolerate compression but she has used Ace wrap in the past.  No orthopnea, PND. Reports no palpitations.     Past Medical History    Past Medical History:  Diagnosis Date   Allergy    Anxiety    Arthritis    CAD (coronary artery disease)    Mild per remote cath in 2006, /    Nuclear, January, 2013, no significant abnormality,   Carotid  artery disease ([place])    Doppler, January, 2013, 0 30% RIC A., 16-01% LICA, stable   Cataract    CHF (congestive heart failure) (HCC)    Chronic kidney disease    Complication of anesthesia    wakes up "mean"   COPD 02/16/2007   Intolerant of Spiriva, tudorza Intolerant of Trelegy    COPD (chronic obstructive pulmonary disease) ([place])    Coronary atherosclerosis 11/24/2008   Catheterization 2006, nonobstructive coronary disease   //   nuclear 2013, low risk     Diabetes mellitus, type 2 ([place])    Diverticula of colon     DIVERTICULOSIS OF COLON 03/23/2007   Qualifier: Diagnosis of  By: Halford Chessman MD, Vineet     Dizziness    occasional   Dizzy spells 05/04/2011   DM (diabetes mellitus) ([place]) 03/23/2007   Qualifier: Diagnosis of  By: Halford Chessman MD, Vineet     Dyspnea    On exertion   Ejection fraction    Essential hypertension 02/16/2007   Qualifier: Diagnosis of  By: Rosana Hoes CMA, Tammy     G E R D 03/23/2007   Qualifier: Diagnosis of  By: Halford Chessman MD, Vineet     Gall bladder disease 04/28/2016   Gastritis and gastroduodenitis    GERD (gastroesophageal reflux disease)    Goiter    hx of   Headache(784.0)    migraine   Hyperlipidemia    Hypertension    Hypothyroidism    HYPOTHYROIDISM, POSTSURGICAL 06/04/2008   Qualifier: Diagnosis of  By: Loanne Drilling MD, Sean A    Left ventricular hypertrophy    Leg edema    occasional swelling   Leg swelling 12/09/2018   Myofascial pain syndrome, cervical 01/06/2018   Obesity    OBESITY 11/24/2008   Qualifier: Diagnosis of  By: Sidney Ace     OBSTRUCTIVE SLEEP APNEA 02/16/2007   Auto CPAP 09/03/13 to 10/02/13 >> used on 30 of 30 nights with average 6 hrs and 3 min.  Average AHI is 3.1 with median CPAP 5 cm H2O and 95 th percentile CPAP 6 cm H2O.     Occipital neuralgia of right side 01/06/2018   OSA (obstructive sleep apnea)    cpap setting of 12   Pain in joint of right hip 01/21/2018   Palpitations 01/21/2011   48 hour Holter, 2015, scattered PACs and PVCs.    Paroxysmal atrial fibrillation (HCC)    Pneumonia    PONV (postoperative nausea and vomiting)    severe after every surgery   Pulmonary hypertension, secondary    RHINITIS 07/21/2010        RUQ abdominal pain    S/P left TKA 07/27/2011   Sinus node dysfunction (HCC) 11/08/2015   Sleep apnea    Spinal stenosis of cervical region 11/07/2014   Urinary frequency 12/09/2018   UTI (urinary tract infection) 08/15/2019   per pt report   VENTRICULAR HYPERTROPHY, LEFT 11/24/2008   Qualifier: Diagnosis of  By:  Sidney Ace     Past Surgical History:  Procedure Laterality Date   ABDOMINAL HYSTERECTOMY     with appendectomy and colon polypectomy   AMPUTATION TOE Left 12/15/2019   Procedure: AMPUTATION  LEFT GREAT TOE;  Surgeon: Evelina Bucy, DPM;  Location: [facility];  Service: Podiatry;  Laterality: Left;   AMPUTATION TOE Left 01/22/2022   Procedure: AMPUTATION TOE 4th digit;  Surgeon: Felipa Furnace, DPM;  Location: [facility];  Service: Podiatry;  Laterality: Left;   ANTERIOR CERVICAL DECOMP/DISCECTOMY FUSION  N/A 11/07/2014   Procedure: Anterior Cervical diskectomy and fusion Cervical four-five, Cervical five-six, Cervical six-seven;  Surgeon: Kary Kos, MD;  Location: [facility];  Service: Neurosurgery;  Laterality: N/A;   APPENDECTOMY     ARTERY BIOPSY Right 02/16/2017   Procedure: BIOPSY TEMPORAL ARTERY;  Surgeon: Katha Cabal, MD;  Location: [facility];  Service: Vascular;  Laterality: Right;   ARTERY BIOPSY Left 04/16/2020   Procedure: BIOPSY TEMPORAL ARTERY;  Surgeon: Katha Cabal, MD;  Location: [facility];  Service: Vascular;  Laterality: Left;  LEFT TEMPLE   BACK SURGERY     CARDIAC CATHETERIZATION     CATARACT EXTRACTION W/ INTRAOCULAR LENS  IMPLANT, BILATERAL Bilateral    CHOLECYSTECTOMY N/A 04/29/2016   Procedure: LAPAROSCOPIC CHOLECYSTECTOMY WITH INTRAOPERATIVE CHOLANGIOGRAM;  Surgeon: Alphonsa Overall, MD;  Location: [facility];  Service: General;  Laterality: N/A;   EP IMPLANTABLE DEVICE N/A 11/08/2015   MRI COMPATABLE -- Procedure: Pacemaker Implant;  Surgeon: Deboraha Sprang, MD;  Location: [facility];  Service: Cardiovascular;  Laterality: N/A;   INCISION AND DRAINAGE ABSCESS Right 05/30/2021   Procedure: IRRIGATION AND DEBRIDEMENT OF PREPATELLA BURSA;  Surgeon: Paralee Cancel, MD;  Location: [facility];  Service: Orthopedics;  Laterality: Right;  60 wants standard knee draping and pulse lavage   INCISION AND DRAINAGE ABSCESS Right 07/22/2021   Procedure: INCISION AND DRAINAGE  ABSCESS RIGHT KNEE;  Surgeon: Paralee Cancel, MD;  Location: [facility];  Service: Orthopedics;  Laterality: Right;   INSERT / REPLACE / REMOVE PACEMAKER  2017   Dr. Jens [facility])   IR FLUORO GUIDE CV LINE RIGHT  07/25/2021   IR REMOVAL TUN CV CATH W/O FL  09/04/2021   IR US GUIDE VASC ACCESS RIGHT  07/25/2021   JOINT REPLACEMENT     LUMBAR DISC SURGERY  1990's   x 2   THYROIDECTOMY  2011   TOTAL KNEE ARTHROPLASTY  07/27/2011   Procedure: TOTAL KNEE ARTHROPLASTY;  Surgeon: Mauri Pole, MD;  Location: [facility];  Service: Orthopedics;  Laterality: Left;   UPPER ESOPHAGEAL ENDOSCOPIC ULTRASOUND (EUS) N/A 05/14/2016   Procedure: UPPER ESOPHAGEAL ENDOSCOPIC ULTRASOUND (EUS);  Surgeon: Milus Banister, MD;  Location: [facility];  Service: Endoscopy;  Laterality: N/A;  radial only-will be done mod if needed    Allergies  Allergies  Allergen Reactions   Dulaglutide Nausea Only, Other (See Comments) and Cough    TRULICITY made the patient sick to her stomach (only) several days after using it   Oxycodone Itching and Other (See Comments)    Pt still takes this med even thought it causes itching   Trelegy Ellipta [Fluticasone-Umeclidin-Vilant] Nausea And Vomiting, Other (See Comments) and Cough    Made the patient sick to her stomach (only) several days after using it     EKGs/Labs/Other Studies Reviewed:   The following studies were reviewed today: Right LE Venous Doppler 07/22/2021: Summary:  RIGHT:  - There is no evidence of deep vein thrombosis in the lower extremity.  However, portions of this examination were limited- see technologist  comments above.     - No cystic structure found in the popliteal fossa.     LEFT:  - No evidence of common femoral vein obstruction.    Bilateral Carotid Duplex 07/09/2021: Summary:  Right Carotid: Velocities in the right ICA are consistent with a 1-39%  stenosis.   Left Carotid: Velocities in the left ICA are consistent with a 1-39%  stenosis.    Vertebrals:  Bilateral vertebral arteries demonstrate antegrade flow.  Subclavians: Normal flow hemodynamics were seen in bilateral subclavian arteries.    TTE 10/03/20 IMPRESSIONS    1. Left ventricular ejection fraction, by estimation, is 60 to 65%. The  left ventricle has normal function. The left ventricle has no regional  wall motion abnormalities. There is severe concentric left ventricular  hypertrophy. Left ventricular diastolic   parameters are consistent with Grade II diastolic dysfunction  (pseudonormalization). The global longitudinal strain is normal (assessed  in the A4c view with no foreshortening).   2. Right ventricular systolic function is normal. The right ventricular  size is normal. There is normal pulmonary artery systolic pressure.   3. The mitral valve is grossly normal. No evidence of mitral valve  regurgitation.   4. The aortic valve is tricuspid. Aortic valve regurgitation is not  visualized. No aortic stenosis is present.   Comparison(s): A prior study was performed on 08/13/19. Prior images  reviewed side by side. LV is slightly less dynamic.   EKG:  EKG is not ordered today.  Recent Labs: 03/20/2021: B Natriuretic Peptide 293.1 01/20/2022: ALT 24 01/25/2022: BUN 65; Creatinine, Ser 1.89; Hemoglobin 9.3; Platelets 398; Potassium 4.6; Sodium 137  Recent Lipid Panel No results found for: "CHOL", "TRIG", "HDL", "CHOLHDL", "VLDL", "LDLCALC", "LDLDIRECT"  Risk Assessment/Calculations:   CHA2DS2-VASc Score = 7   This indicates a 11.2% annual risk of stroke. The patient's score is based upon: CHF History: 1 HTN History: 1 Diabetes History: 1 Stroke History: 0 Vascular Disease History: 1 Age Score: 2 Gender Score: 1     Home Medications   Current Meds  Medication Sig   acetaminophen (TYLENOL) 500 MG tablet Take 1,000-1,500 mg by mouth every 8 (eight) hours as needed for mild pain, moderate pain or headache.   albuterol (PROVENTIL) (2.5 MG/3ML)  0.083% nebulizer solution Take 3 mLs (2.5 mg total) by nebulization every 2 (two) hours as needed for wheezing or shortness of breath. (Patient taking differently: Take 2.5 mg by nebulization every 4 (four) hours.)   albuterol (VENTOLIN HFA) 108 (90 Base) MCG/ACT inhaler INHALE 2 PUFFS INTO THE LUNGS EVERY 6 HOURS AS NEEDED FOR WHEEZING OR SHORTNESS OF BREATH (Patient taking differently: Inhale 2 puffs into the lungs See admin instructions. Inhale 2 puffs every morning & at bedtime and an additional 2 puffs up to twice a day as needed for shortness of breath or wheezing)   Alcohol Swabs (B-D SINGLE USE SWABS REGULAR) PADS    allopurinol (ZYLOPRIM) 100 MG tablet Take 100 mg by mouth 2 (two) times daily.   amiodarone (PACERONE) 200 MG tablet Take 1 tablet (200 mg total) by mouth daily.   amLODipine (NORVASC) 10 MG tablet Take 1 tablet (10 mg total) by mouth daily.   apixaban (ELIQUIS) 2.5 MG TABS tablet Take 1 tablet (2.5 mg total) by mouth 2 (two) times daily.   ascorbic acid (VITAMIN C) 500 MG tablet Take 500 mg by mouth daily with lunch.   atorvastatin (LIPITOR) 40 MG tablet Take 40 mg by mouth daily.   Blood Glucose Calibration (TRUE METRIX LEVEL 1) Low SOLN    BREZTRI AEROSPHERE 160-9-4.8 MCG/ACT AERO INHALE 2 PUFFS INTO THE LUNGS IN THE MORNING AND AT BEDTIME.   Carboxymethylcellulose Sodium (ARTIFICIAL TEARS OP) Place 1 drop into both eyes in the morning and at bedtime.   doxycycline (VIBRA-TABS) 100 MG tablet Take 1 tablet (100 mg total) by mouth every 12 (twelve) hours for 10 days.   ferrous sulfate 325 (  65 FE) MG tablet Take 1 tablet (325 mg total) by mouth daily with breakfast. (Patient taking differently: Take 325 mg by mouth daily with lunch.)   fluticasone (FLONASE) 50 MCG/ACT nasal spray USE 2 SPRAYS NASALLY DAILY (Patient taking differently: Place 2 sprays into both nostrils at bedtime.)   gabapentin (NEURONTIN) 800 MG tablet Take 800 mg by mouth See admin instructions. Take 800 mg by  mouth three to four times a day   HYDROcodone-acetaminophen (NORCO) 10-325 MG tablet Take 1 tablet by mouth See admin instructions. Take 1 tablet by mouth three to four times a day   insulin aspart (NOVOLOG) 100 UNIT/ML FlexPen Inject 10-17 Units into the skin See admin instructions. Inject 10-17 units into the skin three times a day with meals "as needed," per SLIDING SCALE   insulin glargine (LANTUS) 100 UNIT/ML injection Inject 0.55 mLs (55 Units total) into the skin 2 (two) times daily. (Patient taking differently: Inject 50-90 Units into the skin 2 (two) times daily after a meal.)   levothyroxine (SYNTHROID) 200 MCG tablet Take 200 mcg by mouth daily before breakfast.   metoprolol succinate (TOPROL-XL) 100 MG 24 hr tablet TAKE 1 TABLET EVERY DAY WITH OR IMMEDIATELY AFTER A MEAL (Patient taking differently: Take 100 mg by mouth in the morning.)   naloxone (NARCAN) nasal spray 4 mg/0.1 mL Place 4 mg into the nose once as needed for opioid reversal.   nitroGLYCERIN (NITROSTAT) 0.4 MG SL tablet Place 1 tablet (0.4 mg total) under the tongue every 5 (five) minutes as needed for chest pain.   PRESCRIPTION MEDICATION CPAP- At bedtime and as needed during any time of rest   torsemide (DEMADEX) 20 MG tablet Take 5 tablets (100 mg total) by mouth daily.   TRUE METRIX BLOOD GLUCOSE TEST test strip    TRUEplus Lancets 28G MISC    vitamin B-12 (CYANOCOBALAMIN) 1000 MCG tablet Take 1,000 mcg by mouth daily with lunch.   Vitamin D3 (VITAMIN D) 25 MCG tablet Take 1,000 Units by mouth daily with lunch.     Review of Systems      All other systems reviewed and are otherwise negative except as noted above.  Physical Exam    VS:  BP 138/60 (BP Location: Left Arm, Patient Position: Sitting, Cuff Size: Large)   Pulse 68   Ht '5\' 5"'$  (1.651 m)   Wt 250 lb (113.4 kg)   SpO2 95%   BMI 41.60 kg/m  , BMI Body mass index is 41.6 kg/m.  Wt Readings from Last 3 Encounters:  01/29/22 250 lb (113.4 kg)  01/22/22  246 lb 14.6 oz (112 kg)  01/12/22 248 lb (112.5 kg)     GEN: Well nourished, well developed, in no acute distress. HEENT: normal. Neck: Supple, no JVD, carotid bruits, or masses. Cardiac: RRR, no murmurs, rubs, or gallops.  3+ pitting edema in bilateral lower ext R > L, edema in bilateral hands Radials/PT 2+ and equal bilaterally.  Respiratory:  Respirations regular and unlabored, wheezing in all fields GI: Soft, nontender, nondistended. MS: No deformity or atrophy. Skin: Warm and dry, no rash. Neuro:  Strength and sensation are intact. Psych: Normal affect.  Assessment & Plan    Chronic diastolic heart failure -Fluid overloaded today with 3 + pitting edema in lower ext. -Weight has been the same per patient -discharged on Demadex '100mg'$  daily, will increase to '120mg'$  x 5 days and order a BMP next week, creatinine baseline looks to be 1.7-1.8 -We will consider adding  metolazone if no resolution -Update Echo -ACE wrap encouraged on bilateral lower extremities  Paroxysmal atrial fibrillation -Continue amiodarone 200 mg daily -Continue Eliquis 2.5 mg twice a day -stable and no current symptoms  COPD -Follows Dr. Halford Chessman, but still very symptomatic -She is taking albuterol nebulizer treatment as well as rescue inhaler.  She is on [employer] -Her SOB is likely multifactorial and her uncontrolled COPD is certainly not helping things  Chronic cellulitis of right lower extremity -right leg with swelling and erythema  -this is an ongoing infection and she has been on multiple antibiotics -Recommend culture of the leg when it is weeping and specific antibiotics for the bacteria present -Recommend wrapping with Ace bandage to keep it protected from scrapes -Follow-up with PCP- I was unable to message her via Epic    Disposition: Follow up 6 weeks with Candee Furbish, MD or APP.  Signed, Elgie Collard, PA-C 01/29/2022, 11:51 AM [place]

## 2022-01-28 ENCOUNTER — Ambulatory Visit: Payer: Medicare HMO

## 2022-01-28 ENCOUNTER — Ambulatory Visit (INDEPENDENT_AMBULATORY_CARE_PROVIDER_SITE_OTHER): Payer: Medicare HMO | Admitting: Podiatry

## 2022-01-28 DIAGNOSIS — Z89422 Acquired absence of other left toe(s): Secondary | ICD-10-CM

## 2022-01-28 NOTE — Progress Notes (Signed)
Subjective:  Patient ID: Kayla [name], female    DOB: 01/13/1941,  MRN: [id number]  Chief Complaint  Patient presents with   Routine Post Op    DOS: 01/22/2022 Procedure: Left fourth digit amputation  81 y.o. female returns for post-op check.  Patient states that she is doing well.  Minimal pain.  Bandages clean dry and intact.  No nausea fever chills vomiting Review of Systems: Negative except as noted in the HPI. Denies N/V/F/Ch.  Past Medical History:  Diagnosis Date   Allergy    Anxiety    Arthritis    CAD (coronary artery disease)    Mild per remote cath in 2006, /    Nuclear, January, 2013, no significant abnormality,   Carotid artery disease ([place])    Doppler, January, 2013, 0 41% RIC A., 93-79% LICA, stable   Cataract    CHF (congestive heart failure) ([place])    Chronic kidney disease    Complication of anesthesia    wakes up "mean"   COPD 02/16/2007   Intolerant of Spiriva, tudorza Intolerant of Trelegy    COPD (chronic obstructive pulmonary disease) ([place])    Coronary atherosclerosis 11/24/2008   Catheterization 2006, nonobstructive coronary disease   //   nuclear 2013, low risk     Diabetes mellitus, type 2 ([place])    Diverticula of colon    DIVERTICULOSIS OF COLON 03/23/2007   Qualifier: Diagnosis of  By: Halford Chessman MD, Vineet     Dizziness    occasional   Dizzy spells 05/04/2011   DM (diabetes mellitus) ([place]) 03/23/2007   Qualifier: Diagnosis of  By: Halford Chessman MD, Vineet     Dyspnea    On exertion   Ejection fraction    Essential hypertension 02/16/2007   Qualifier: Diagnosis of  By: Rosana Hoes CMA, Tammy     G E R D 03/23/2007   Qualifier: Diagnosis of  By: Halford Chessman MD, Vineet     Gall bladder disease 04/28/2016   Gastritis and gastroduodenitis    GERD (gastroesophageal reflux disease)    Goiter    hx of   Headache(784.0)    migraine   Hyperlipidemia    Hypertension    Hypothyroidism    HYPOTHYROIDISM, POSTSURGICAL 06/04/2008   Qualifier: Diagnosis of  By: Loanne Drilling  MD, Sean A    Left ventricular hypertrophy    Leg edema    occasional swelling   Leg swelling 12/09/2018   Myofascial pain syndrome, cervical 01/06/2018   Obesity    OBESITY 11/24/2008   Qualifier: Diagnosis of  By: Sidney Ace     OBSTRUCTIVE SLEEP APNEA 02/16/2007   Auto CPAP 09/03/13 to 10/02/13 >> used on 30 of 30 nights with average 6 hrs and 3 min.  Average AHI is 3.1 with median CPAP 5 cm H2O and 95 th percentile CPAP 6 cm H2O.     Occipital neuralgia of right side 01/06/2018   OSA (obstructive sleep apnea)    cpap setting of 12   Pain in joint of right hip 01/21/2018   Palpitations 01/21/2011   48 hour Holter, 2015, scattered PACs and PVCs.    Paroxysmal atrial fibrillation (HCC)    Pneumonia    PONV (postoperative nausea and vomiting)    severe after every surgery   Pulmonary hypertension, secondary    RHINITIS 07/21/2010        RUQ abdominal pain    S/P left TKA 07/27/2011   Sinus node dysfunction (HCC) 11/08/2015   Sleep apnea  Spinal stenosis of cervical region 11/07/2014   Urinary frequency 12/09/2018   UTI (urinary tract infection) 08/15/2019   per pt report   VENTRICULAR HYPERTROPHY, LEFT 11/24/2008   Qualifier: Diagnosis of  By: Sidney Ace      Current Outpatient Medications:    acetaminophen (TYLENOL) 500 MG tablet, Take 1,000-1,500 mg by mouth every 8 (eight) hours as needed for mild pain, moderate pain or headache., Disp: , Rfl:    albuterol (PROVENTIL) (2.5 MG/3ML) 0.083% nebulizer solution, Take 3 mLs (2.5 mg total) by nebulization every 2 (two) hours as needed for wheezing or shortness of breath. (Patient taking differently: Take 2.5 mg by nebulization every 4 (four) hours.), Disp: 75 mL, Rfl: 5   albuterol (VENTOLIN HFA) 108 (90 Base) MCG/ACT inhaler, INHALE 2 PUFFS INTO THE LUNGS EVERY 6 HOURS AS NEEDED FOR WHEEZING OR SHORTNESS OF BREATH (Patient taking differently: Inhale 2 puffs into the lungs See admin instructions. Inhale 2 puffs every  morning & at bedtime and an additional 2 puffs up to twice a day as needed for shortness of breath or wheezing), Disp: 3 each, Rfl: 3   Alcohol Swabs (B-D SINGLE USE SWABS REGULAR) PADS, , Disp: , Rfl:    allopurinol (ZYLOPRIM) 100 MG tablet, Take 100 mg by mouth 2 (two) times daily., Disp: , Rfl:    amiodarone (PACERONE) 200 MG tablet, Take 1 tablet (200 mg total) by mouth daily., Disp: 90 tablet, Rfl: 3   amLODipine (NORVASC) 10 MG tablet, Take 1 tablet (10 mg total) by mouth daily., Disp: 30 tablet, Rfl: 3   apixaban (ELIQUIS) 2.5 MG TABS tablet, Take 1 tablet (2.5 mg total) by mouth 2 (two) times daily., Disp: 60 tablet, Rfl: 1   ascorbic acid (VITAMIN C) 500 MG tablet, Take 500 mg by mouth daily with lunch., Disp: , Rfl:    atorvastatin (LIPITOR) 40 MG tablet, Take 40 mg by mouth daily., Disp: , Rfl:    Blood Glucose Calibration (TRUE METRIX LEVEL 1) Low SOLN, , Disp: , Rfl:    BREZTRI AEROSPHERE 160-9-4.8 MCG/ACT AERO, INHALE 2 PUFFS INTO THE LUNGS IN THE MORNING AND AT BEDTIME., Disp: 32.1 g, Rfl: 3   Carboxymethylcellulose Sodium (ARTIFICIAL TEARS OP), Place 1 drop into both eyes in the morning and at bedtime., Disp: , Rfl:    doxycycline (VIBRA-TABS) 100 MG tablet, Take 1 tablet (100 mg total) by mouth every 12 (twelve) hours for 10 days., Disp: 20 tablet, Rfl: 0   ferrous sulfate 325 (65 FE) MG tablet, Take 1 tablet (325 mg total) by mouth daily with breakfast. (Patient taking differently: Take 325 mg by mouth daily with lunch.), Disp: 90 tablet, Rfl: 3   fluticasone (FLONASE) 50 MCG/ACT nasal spray, USE 2 SPRAYS NASALLY DAILY (Patient taking differently: Place 2 sprays into both nostrils at bedtime.), Disp: 48 g, Rfl: 1   gabapentin (NEURONTIN) 800 MG tablet, Take 800 mg by mouth See admin instructions. Take 800 mg by mouth three to four times a day, Disp: , Rfl:    HYDROcodone-acetaminophen (NORCO) 10-325 MG tablet, Take 1 tablet by mouth See admin instructions. Take 1 tablet by mouth  three to four times a day, Disp: , Rfl:    insulin aspart (NOVOLOG) 100 UNIT/ML FlexPen, Inject 10-17 Units into the skin See admin instructions. Inject 10-17 units into the skin three times a day with meals "as needed," per SLIDING SCALE, Disp: , Rfl:    insulin glargine (LANTUS) 100 UNIT/ML injection, Inject 0.55 mLs (55 Units  total) into the skin 2 (two) times daily. (Patient taking differently: Inject 50-90 Units into the skin 2 (two) times daily after a meal.), Disp: 10 mL, Rfl: 11   levothyroxine (SYNTHROID) 200 MCG tablet, Take 200 mcg by mouth daily before breakfast., Disp: , Rfl:    metoprolol succinate (TOPROL-XL) 100 MG 24 hr tablet, TAKE 1 TABLET EVERY DAY WITH OR IMMEDIATELY AFTER A MEAL (Patient taking differently: Take 100 mg by mouth in the morning.), Disp: 90 tablet, Rfl: 3   naloxone (NARCAN) nasal spray 4 mg/0.1 mL, Place 4 mg into the nose once as needed for opioid reversal., Disp: , Rfl:    nitroGLYCERIN (NITROSTAT) 0.4 MG SL tablet, Place 1 tablet (0.4 mg total) under the tongue every 5 (five) minutes as needed for chest pain., Disp: 25 tablet, Rfl: 4   PRESCRIPTION MEDICATION, CPAP- At bedtime and as needed during any time of rest, Disp: , Rfl:    torsemide (DEMADEX) 20 MG tablet, Take 5 tablets (100 mg total) by mouth daily., Disp: 135 tablet, Rfl: 3   TRUE METRIX BLOOD GLUCOSE TEST test strip, , Disp: , Rfl:    TRUEplus Lancets 28G MISC, , Disp: , Rfl:    vitamin B-12 (CYANOCOBALAMIN) 1000 MCG tablet, Take 1,000 mcg by mouth daily with lunch., Disp: , Rfl:    Vitamin D3 (VITAMIN D) 25 MCG tablet, Take 1,000 Units by mouth daily with lunch., Disp: , Rfl:   Social History   Tobacco Use  Smoking Status Former   Packs/day: 2.00   Years: 33.00   Total pack years: 66.00   Types: Cigarettes   Start date: 1966   Quit date: 05/04/1997   Years since quitting: 24.7  Smokeless Tobacco Never    Allergies  Allergen Reactions   Dulaglutide Nausea Only, Other (See Comments) and  Cough    TRULICITY made the patient sick to her stomach (only) several days after using it   Oxycodone Itching and Other (See Comments)    Pt still takes this med even thought it causes itching   Trelegy Ellipta [Fluticasone-Umeclidin-Vilant] Nausea And Vomiting, Other (See Comments) and Cough    Made the patient sick to her stomach (only) several days after using it    Objective:  There were no vitals filed for this visit. There is no height or weight on file to calculate BMI. Constitutional Well developed. Well nourished.  Vascular Foot warm and well perfused. Capillary refill normal to all digits.   Neurologic Normal speech. Oriented to person, place, and time. Epicritic sensation to light touch grossly present bilaterally.  Dermatologic Skin healing well without signs of infection. Skin edges well coapted without signs of infection.  Orthopedic: Tenderness to palpation noted about the surgical site.   Radiographs: None Assessment:   1. History of amputation of lesser toe of left foot ([place])    Plan:  Patient was evaluated and treated and all questions answered.  S/p foot surgery left -Progressing as expected post-operatively. -XR: See above -WB Status: Weightbearing as tolerated in surgical shoe -Sutures: Intact.  No clinical signs of Deis is noted no complication noted. -Medications: None -Foot redressed.  No follow-ups on file.

## 2022-01-29 ENCOUNTER — Encounter: Payer: Self-pay | Admitting: Physician Assistant

## 2022-01-29 ENCOUNTER — Telehealth: Payer: Self-pay

## 2022-01-29 ENCOUNTER — Ambulatory Visit: Payer: Medicare HMO | Attending: Physician Assistant | Admitting: Physician Assistant

## 2022-01-29 VITALS — BP 138/60 | HR 68 | Ht 65.0 in | Wt 250.0 lb

## 2022-01-29 DIAGNOSIS — I495 Sick sinus syndrome: Secondary | ICD-10-CM | POA: Diagnosis not present

## 2022-01-29 DIAGNOSIS — L03115 Cellulitis of right lower limb: Secondary | ICD-10-CM | POA: Diagnosis not present

## 2022-01-29 DIAGNOSIS — I480 Paroxysmal atrial fibrillation: Secondary | ICD-10-CM | POA: Diagnosis not present

## 2022-01-29 DIAGNOSIS — Z79899 Other long term (current) drug therapy: Secondary | ICD-10-CM | POA: Diagnosis not present

## 2022-01-29 DIAGNOSIS — I5032 Chronic diastolic (congestive) heart failure: Secondary | ICD-10-CM

## 2022-01-29 NOTE — Telephone Encounter (Signed)
ATC patient. LMTCB to be scheduled for a follow up with Dr. Halford Chessman or Rexene Edison NP in [facility] office.

## 2022-01-29 NOTE — Discharge Summary (Signed)
Physician Discharge Summary   Patient: Kayla [name] MRN: [id number] DOB: 1941/02/12  Admit date:     01/20/2022  Discharge date: 01/25/2022  Discharge Physician: Hosie Poisson   PCP: Jerline Pain, MD   Recommendations at discharge:  Please follow up with PCp in one week.  Please follow up with Heart failure clinic in one week.  Please check cbc and BMP in one week.   Discharge Diagnoses: Principal Problem:   Toe osteomyelitis, left (HCC) Active Problems:   Insulin dependent type 2 diabetes mellitus ([place])   Hypertension associated with diabetes ([place])   Paroxysmal atrial fibrillation (HCC)   HYPOTHYROIDISM, POSTSURGICAL   Hyperlipidemia associated with type 2 diabetes mellitus (HCC)   OBSTRUCTIVE SLEEP APNEA   COPD (chronic obstructive pulmonary disease) (HCC)   Chronic kidney disease, stage 3b (HCC)   Leg swelling   Polymyalgia rheumatica [facility])   Hospital Course: Kayla [name] is a 81 y.o. female with medical history significant of COPD and chronic hypoxemia on 3 L oxygen at home, obstructive sleep apnea on CPAP at night, sick sinus syndrome status post pacemaker, A-fib on amiodarone and metoprolol, therapeutic on Eliquis, insulin-dependent type 2 diabetes, chronic venous stasis pigmentation and lymphedema, amputation of the tip of the left great toe presents with pain and discharge from the tip of the left fourth toe.  This started after she clipped her toe about 2 weeks ago and she thinks she might have clipped some skin.  It continued to drain to the point now it is draining pus.  X-ray with destructed toe.  Case discussed with [facility], Dr. Boneta Lucks who requested MRI and ABI.   Assessment and Plan:  Left 4 th toe osteomyelitis/ Diabetic toe ulcer: MRI of the foot confirming osteomyelitis.  Appreciate podiatry recommendations.  Continue with broad spectrum IV antibiotics. Improving leukocytosis. Possible transition to oral antibiotics in the next 24  hours.  CRP is 5.2, lactic acid is 1.4. MRSA detected. ABI's  done and reviewed.  Pain control with IV morphine and oxycodone.  Elevated legs.  Podiatry on board and underwent toe amputation on the 4 th digit.  Therapy eval done, no PT follow up.        Type 2 DM, poorly controlled with diabetic neuropathy, insulin dependent , uncontrolled with hyperglycemia.     Resume insulin and SSI.  Continue with gabapentin . No changes in  meds     COPD WITH chronic hypoxemia on 3 lit of [place] oxygen, Chronic hypoxic respiratory failure In the setting of sleep apnea on CPAP at night.  No wheezing or rhonchi. But she reports worsening sob and right sided chest pain in the lateral aspect. Patient reports h/o pleurisy in the past.  CXR is negative Resume home meds.      Uncontrolled hypertension partly from toe pain.  Increase amlodipine 10 mg daily, add lasix 60 mg IV BID, . Added Hydralazine 25 mg TID.        Stage 3 b CKD.  Creatinine at baseline.      Hyperlipidemia:  Resume statin.      Paroxysmal Atrial fibrillation  Rate controlled on amiodarone and metoprolol  Restarted eliquis    Hypothyroidism:  Resume synthroid.    Anemia of chronic disease:  Transfuse to keep hemoglobin greater than 7.  Hemoglobin is stable at 9.6%.    Body mass index is 41.09 kg/m. Morbid obesity. Recommend outpatient followup with PCP.      Acute on chronic  diastolic heart failure:  - started on IV lasix. She is currently on 60 mg IV BID.  - she remains on 3 lit of [place] oxygen.  - diuresing well, about 4.5 Lit negative  since admission - pt is on 80 mg of torsemide at home, she reports her home diuretic is not working,  Initially planned for cardiology inpatient consult,  but patient wanted to go home, and follow up with cardiology in the office.  - increased her torsemide to 100 mg daily.    Consultants: podiatry.  Procedures performed: amputation.   Disposition: Home Diet recommendation:   Discharge Diet Orders (From admission, onward)     Start     Ordered   01/25/22 0000  Diet - low sodium heart healthy        01/25/22 1248           Carb modified diet DISCHARGE MEDICATION: Allergies as of 01/25/2022       Reactions   Dulaglutide Nausea Only, Other (See Comments), Cough   TRULICITY made the patient sick to her stomach (only) several days after using it   Oxycodone Itching, Other (See Comments)   Pt still takes this med even thought it causes itching   Trelegy Ellipta [fluticasone-umeclidin-vilant] Nausea And Vomiting, Other (See Comments), Cough   Made the patient sick to her stomach (only) several days after using it         Medication List     STOP taking these medications    guaiFENesin 600 MG 12 hr tablet Commonly known as: Mucinex   predniSONE 20 MG tablet Commonly known as: DELTASONE       TAKE these medications    acetaminophen 500 MG tablet Commonly known as: TYLENOL Take 1,000-1,500 mg by mouth every 8 (eight) hours as needed for mild pain, moderate pain or headache.   albuterol (2.5 MG/3ML) 0.083% nebulizer solution Commonly known as: PROVENTIL Take 3 mLs (2.5 mg total) by nebulization every 2 (two) hours as needed for wheezing or shortness of breath. What changed: when to take this   albuterol 108 (90 Base) MCG/ACT inhaler Commonly known as: VENTOLIN HFA INHALE 2 PUFFS INTO THE LUNGS EVERY 6 HOURS AS NEEDED FOR WHEEZING OR SHORTNESS OF BREATH What changed:  when to take this additional instructions   allopurinol 100 MG tablet Commonly known as: ZYLOPRIM Take 100 mg by mouth 2 (two) times daily.   amiodarone 200 MG tablet Commonly known as: PACERONE Take 1 tablet (200 mg total) by mouth daily.   amLODipine 10 MG tablet Commonly known as: NORVASC Take 1 tablet (10 mg total) by mouth daily. What changed:  medication strength how much to take   apixaban 2.5 MG Tabs tablet Commonly known as: ELIQUIS Take 1 tablet (2.5  mg total) by mouth 2 (two) times daily.   ARTIFICIAL TEARS OP Place 1 drop into both eyes in the morning and at bedtime.   ascorbic acid 500 MG tablet Commonly known as: VITAMIN C Take 500 mg by mouth daily with lunch.   atorvastatin 40 MG tablet Commonly known as: LIPITOR Take 40 mg by mouth daily.   B-D SINGLE USE SWABS REGULAR Pads   Breztri Aerosphere 160-9-4.8 MCG/ACT Aero Generic drug: Budeson-Glycopyrrol-Formoterol INHALE 2 PUFFS INTO THE LUNGS IN THE MORNING AND AT BEDTIME.   cyanocobalamin 1000 MCG tablet Commonly known as: VITAMIN B12 Take 1,000 mcg by mouth daily with lunch.   doxycycline 100 MG tablet Commonly known as: VIBRA-TABS Take 1 tablet (100 mg  total) by mouth every 12 (twelve) hours for 10 days.   ferrous sulfate 325 (65 FE) MG tablet Take 1 tablet (325 mg total) by mouth daily with breakfast. What changed: when to take this   fluticasone 50 MCG/ACT nasal spray Commonly known as: FLONASE USE 2 SPRAYS NASALLY DAILY What changed:  how much to take how to take this when to take this additional instructions   gabapentin 800 MG tablet Commonly known as: NEURONTIN Take 800 mg by mouth See admin instructions. Take 800 mg by mouth three to four times a day What changed: Another medication with the same name was removed. Continue taking this medication, and follow the directions you see here.   HYDROcodone-acetaminophen 10-325 MG tablet Commonly known as: NORCO Take 1 tablet by mouth See admin instructions. Take 1 tablet by mouth three to four times a day   insulin aspart 100 UNIT/ML FlexPen Commonly known as: NOVOLOG Inject 10-17 Units into the skin See admin instructions. Inject 10-17 units into the skin three times a day with meals "as needed," per SLIDING SCALE   insulin glargine 100 UNIT/ML injection Commonly known as: LANTUS Inject 0.55 mLs (55 Units total) into the skin 2 (two) times daily. What changed:  how much to take when to take  this   levothyroxine 200 MCG tablet Commonly known as: SYNTHROID Take 200 mcg by mouth daily before breakfast.   metoprolol succinate 100 MG 24 hr tablet Commonly known as: TOPROL-XL TAKE 1 TABLET EVERY DAY WITH OR IMMEDIATELY AFTER A MEAL What changed: See the new instructions.   naloxone 4 MG/0.1ML Liqd nasal spray kit Commonly known as: NARCAN Place 4 mg into the nose once as needed for opioid reversal.   nitroGLYCERIN 0.4 MG SL tablet Commonly known as: NITROSTAT Place 1 tablet (0.4 mg total) under the tongue every 5 (five) minutes as needed for chest pain.   PRESCRIPTION MEDICATION CPAP- At bedtime and as needed during any time of rest   torsemide 20 MG tablet Commonly known as: DEMADEX Take 5 tablets (100 mg total) by mouth daily. What changed: See the new instructions.   True Metrix Blood Glucose Test test strip Generic drug: glucose blood   True Metrix Level 1 Low Soln   TRUEplus Lancets 28G Misc   vitamin D3 25 MCG tablet Commonly known as: CHOLECALCIFEROL Take 1,000 Units by mouth daily with lunch.               Discharge Care Instructions  (From admission, onward)           Start     Ordered   01/25/22 0000  Discharge wound care:       Comments: As per podiatry.   01/25/22 1248            Follow-up Information     Jerline Pain, MD. Schedule an appointment as soon as possible for a visit in 1 week(s).   Specialty: Cardiology Contact information: [address] [address] [phone]         Jerline Pain, MD .   Specialty: Cardiology Contact information: [phone] [address] [address] [phone]         Deboraha Sprang, MD .   Specialty: Cardiology Contact information: [phone] [place] [address] [phone]         [place]. Schedule an appointment as soon as possible for  a visit in 1  week(s).   Specialty: Cardiology Contact information: [address] [phone] [place] [phone]               Discharge Exam: Danley Danker Weights   01/20/22 1425 01/22/22 1107  Weight: 112.5 kg 112 kg   General exam: Appears calm and comfortable  Respiratory system: Clear to auscultation. Respiratory effort normal. Cardiovascular system: S1 & S2 heard, RRR. . Improving bilateral pedal edema. Gastrointestinal system: Abdomen is nondistended, soft and nontender.  Normal bowel sounds heard. Central nervous system: Alert and oriented. No focal neurological deficits. Extremities: left foot wrapped in bandage.  Skin: no rashes seen.  Psychiatry:. Mood & affect appropriate.    Condition at discharge: fair  The results of significant diagnostics from this hospitalization (including imaging, microbiology, ancillary and laboratory) are listed below for reference.   Imaging Studies: DG Chest 2 View  Result Date: 01/23/2022 CLINICAL DATA:  Shortness of breath. EXAM: CHEST - 2 VIEW COMPARISON:  Chest x-ray dated 12/12/2021. FINDINGS: Borderline cardiomegaly. LEFT chest wall pacemaker/ICD apparatus appears stable in position. Lungs are clear. No pleural effusion or pneumothorax is seen. No acute-appearing osseous abnormality. IMPRESSION: No active cardiopulmonary disease. No evidence of pneumonia or pulmonary edema. Borderline cardiomegaly. Electronically Signed   By: Franki Cabot M.D.   On: 01/23/2022 15:35   DG Foot Complete Left  Result Date: 01/22/2022 CLINICAL DATA:  Postoperative left foot EXAM: LEFT FOOT - COMPLETE 3+ VIEW COMPARISON:  Left foot radiographs 01/20/2022, MRI left foot 01/21/2022 FINDINGS: Interval amputation of the fourth toe phalanges. Unchanged small 5 mm less than 1 mm linear metallic foreign body overlying the medial fifth toe. Unchanged acute to subacute fracture of the medial and lateral aspects of the base of the proximal  phalanx of the second toe. Unchanged remote amputation of the distal phalanx of the great toe. Moderate plantar calcaneal heel spur. IMPRESSION: Compared to 01/20/2022: 1. Interval amputation of the fourth toe phalanges. 2. Otherwise, no significant change from prior. 3. Acute to subacute fracture of the base of the proximal phalanx of the second toe. Electronically Signed   By: Yvonne Kendall M.D.   On: 01/22/2022 14:03   VAS [place] ABI WITH/WO TBI  Result Date: 01/21/2022  LOWER EXTREMITY DOPPLER STUDY Patient Name:  AFREEN SIEBELS  Date of Exam:   01/21/2022 Medical Rec #: [id number]         Accession #:    [id number] Date of Birth: 11/24/1940        Patient Gender: F Patient Age:   6 years Exam Location:  [facility] Procedure:      VAS [place] ABI WITH/WO TBI Referring Phys: Barb Merino --------------------------------------------------------------------------------  Indications: Ulceration. Parasthesia 12/15/2019 - AMPUTATION LEFT GREAT TOE Left High Risk Factors: Hypertension, hyperlipidemia, Diabetes.  Limitations: Today's exam was limited due to patient positioning, an open wound,              bandages , involuntary patient movement and patient unable to lie              flat. Comparison Study: No prior studies. Performing Technologist: Carlos Levering RVT  Examination Guidelines: A complete evaluation includes at minimum, Doppler waveform signals and systolic blood pressure reading at the level of bilateral brachial, anterior tibial, and posterior tibial arteries, when vessel segments are accessible. Bilateral testing is considered an integral part of a complete examination. Photoelectric Plethysmograph (PPG) waveforms and toe systolic pressure readings are included as  required and additional duplex testing as needed. Limited examinations for reoccurring indications may be performed as noted.  ABI Findings: +---------+------------------+-----+-----------+--------+ Right    Rt Pressure  (mmHg)IndexWaveform   Comment  +---------+------------------+-----+-----------+--------+ Brachial 199                    triphasic           +---------+------------------+-----+-----------+--------+ PTA      205               1.03 multiphasic         +---------+------------------+-----+-----------+--------+ DP       200               1.01 multiphasic         +---------+------------------+-----+-----------+--------+ Great Toe167               0.84                     +---------+------------------+-----+-----------+--------+ +--------+------------------+-----+-----------+-------+ Left    Lt Pressure (mmHg)IndexWaveform   Comment +--------+------------------+-----+-----------+-------+ [id number]                    triphasic          +--------+------------------+-----+-----------+-------+ PTA     254               1.28 multiphasicNC      +--------+------------------+-----+-----------+-------+ DP      247               1.24 multiphasicNC      +--------+------------------+-----+-----------+-------+ +-------+-----------+-----------+------------+------------+ ABI/TBIToday's ABIToday's TBIPrevious ABIPrevious TBI +-------+-----------+-----------+------------+------------+ Right  1.03       0.84                                +-------+-----------+-----------+------------+------------+   Summary: Right: Resting right ankle-brachial index is within normal range. The right toe-brachial index is normal. Unable to obtain great toe waveform due to sensor malfunction. Left: Resting left ankle-brachial index indicates noncompressible left lower extremity arteries. Unable to obtain TBI due to partial great toe amputation. *See table(s) above for measurements and observations.  Electronically signed by Jamelle Haring on 01/21/2022 at 4:18:47 PM.    Final    MRI Left foot without contrast  Result Date: 01/21/2022 CLINICAL DATA:  Osteomyelitis suspected. EXAM: MRI OF THE  LEFT FOOT WITHOUT CONTRAST TECHNIQUE: Multiplanar, multisequence MR imaging of the left was performed. No intravenous contrast was administered. COMPARISON:  Radiographs dated January 20, 2022 FINDINGS: Bones/Joint/Cartilage Postsurgical changes for prior amputation of the first ray at the interphalangeal joint. Bone marrow edema of the proximal phalanx of the second digit, with cortical irregularity consistent with recent fracture. Erosion of the distal phalanx of the fourth digit and edema of the middle and distal phalanx of the fourth digit concerning for osteomyelitis. Susceptibility artifact about the base of the fifth metatarsal from the small metallic fragment. Hallux valgus deformity. Ligaments Collateral and cruciate ligaments are intact. Muscles and Tendons Muscles and tendons are intact. No intramuscular fluid collection or hematoma. Soft tissues Soft tissue edema prominent about the fourth digit with skin irregularity about the distal aspect of the fourth digit consistent with known open wound. Soft tissue swelling about the dorsum of the foot without evidence of fluid collection or hematoma. IMPRESSION: 1. Marrow edema of the middle and distal phalanx the fourth digit with erosion of the distal phalanx, which in the presence of adjacent  deep skin wound and inflammatory changes consistent with acute osteomyelitis. 2. Marrow edema of the proximal phalanx of the second digit with cortical irregularity suggesting recent fracture. 3. Postsurgical changes for prior amputation at the first interphalangeal joint with hallux valgus deformity. 4. Soft tissue edema about dorsum of the foot with deep skin wound about the distal aspect of the fourth digit. No fluid collection or abscess. Electronically Signed   By: Keane Police D.O.   On: 01/21/2022 11:30   DG Foot Complete Left  Result Date: 01/20/2022 CLINICAL DATA:  Left great toe infection. EXAM: LEFT FOOT - COMPLETE 3+ VIEW COMPARISON:  Left foot  radiographs 12/15/2019 FINDINGS: Redemonstration of amputation of the distal phalanx of the great toe. Interval removal of prior surgical skin staples. New acute to subacute fracture of the medial and lateral aspects of the base of the proximal phalanx of the second toe with 3 mm cortical step-off of the distal portion of the lateral fracture component and intra-articular extension of both the medial and lateral fractures. There is moderate to high-grade fourth toe soft tissue swelling with irregularity at the distal aspect suggesting a wound. There is distal subcutaneous air. There is moderate erosion and fragmentation of the majority of the distal phalanx of the fourth toe, new from prior. This is concerning for acute osteomyelitis. Redemonstration of 5 by less than 1 mm metallic density overlying the medial fifth toe soft tissues, likely a chronic foreign body. Moderate plantar calcaneal heel spur. Mild-to-moderate great toe metatarsophalangeal joint space narrowing and peripheral osteophytosis with mild hallux valgus. IMPRESSION: 1. Moderate to high-grade fourth toe soft tissue swelling with distal soft tissue wound and subcutaneous air. Moderate erosion and fragmentation of the majority of the distal phalanx of fourth toe concerning for acute osteomyelitis. 2. New acute to subacute fracture of the medial and lateral aspects of the base of the proximal phalanx of the second toe. 3. Prior amputation of the distal phalanx of the great toe. 4. Unchanged linear metallic foreign body overlying the medial fifth toe. Electronically Signed   By: Yvonne Kendall M.D.   On: 01/20/2022 15:24   CUP PACEART REMOTE DEVICE CHECK  Result Date: 01/19/2022 Scheduled remote reviewed. Normal device function.  Next remote 91 days.  CT Head Wo Contrast  Result Date: 01/14/2022 CLINICAL DATA:  Polytrauma, blunt fall, on Eliquis. EXAM: CT HEAD WITHOUT CONTRAST TECHNIQUE: Contiguous axial images were obtained from the base of the  skull through the vertex without intravenous contrast. RADIATION DOSE REDUCTION: This exam was performed according to the departmental dose-optimization program which includes automated exposure control, adjustment of the mA and/or kV according to patient size and/or use of iterative reconstruction technique. COMPARISON:  Head MRI 11/17/2017 and CT 10/30/2017 FINDINGS: The study is mildly motion degraded. Brain: There is no evidence of an acute infarct, intracranial hemorrhage, mass, midline shift, or extra-axial fluid collection. There is mild cerebral atrophy. Hypodensities in the cerebral white matter bilaterally have progressed and are nonspecific but compatible with mild chronic small vessel ischemic disease. 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 Vascular: Calcified atherosclerosis at the skull base. No hyperdense vessel. Skull: No acute fracture or suspicious osseous lesion. Sinuses/Orbits: Visualized paranasal sinuses and mastoid air cells are clear. Bilateral cataract extraction. Other: None. IMPRESSION: 1. No evidence of acute intracranial abnormality. 2. Mild chronic small vessel ischemic disease. Electronically Signed   By: Logan Bores M.D.   On: 01/14/2022 15:06   CT Cervical Spine Wo  Contrast  Result Date: 01/14/2022 CLINICAL DATA:  Trauma. EXAM: CT CERVICAL SPINE WITHOUT CONTRAST TECHNIQUE: Multidetector CT imaging of the cervical spine was performed without intravenous contrast. Multiplanar CT image reconstructions were also generated. RADIATION DOSE REDUCTION: This exam was performed according to the departmental dose-optimization program which includes automated exposure control, adjustment of the mA and/or kV according to patient size and/or use of iterative reconstruction technique. COMPARISON:  Cervical spine CT 10/30/2017 FINDINGS: Alignment: Normal. Skull base and vertebrae: No acute fracture. No primary bone lesion or focal pathologic process. Anterior  fusion plate is seen at C4, C5, C6 and C7. There is no evidence for hardware complication. Soft tissues and spinal canal: No prevertebral fluid or swelling. No visible canal hematoma. Disc levels: There is disc space narrowing and endplate osteophyte formation at C3-C4 and C7-T1 compatible with degenerative change. There is no severe central canal or neural foraminal stenosis at any level. Upper chest: Negative. Other: Thyroidectomy changes are present. Left-sided pacemaker is partially visualized. There are atherosclerotic calcifications of the bilateral carotid artery bifurcations. IMPRESSION: 1. No acute fracture or subluxation. 2. C4-C7 ACDF appears stable.  No complication. Electronically Signed   By: Ronney Asters M.D.   On: 01/14/2022 15:05   DG Lumbar Spine Complete  Result Date: 01/14/2022 CLINICAL DATA:  [place]. Back pain. EXAM: LUMBAR SPINE - COMPLETE 4+ VIEW COMPARISON:  Lumbar spine MRI from 2010 FINDINGS: Interbody Ray cage fusion noted at L5-S1. No complicating features. Moderate age related degenerative lumbar spondylosis with multilevel disc disease and facet disease. No acute lumbar fracture is identified. The visualized bony pelvis is intact. No hip or pelvic fractures. IMPRESSION: 1. No acute bony findings. 2. Age related degenerative lumbar spondylosis. 3. Interbody Ray cage fusion at L5-S1. Electronically Signed   By: Marijo Sanes M.D.   On: 01/14/2022 14:49   DG Thoracic Spine 4V  Result Date: 01/14/2022 CLINICAL DATA:  [place]. Back pain. EXAM: THORACIC SPINE - 4+ VIEW COMPARISON:  Chest x-ray 12/12/2021 FINDINGS: Normal alignment of the thoracic vertebral bodies. No acute fracture is identified. No abnormal paraspinal soft tissue swelling. The visualized posterior ribs are grossly intact. Stable cervical spine fusion hardware and priors thyroid surgery. IMPRESSION: Normal alignment and no acute bony findings. Electronically Signed   By: Marijo Sanes M.D.   On: 01/14/2022 14:47     Microbiology: Results for orders placed or performed during the hospital encounter of 01/20/22  Blood Cultures x 2 sites     Status: None   Collection Time: 01/20/22  3:32 PM   Specimen: BLOOD  Result Value Ref Range Status   Specimen Description   Final    BLOOD LEFT ANTECUBITAL Performed at [facility] [address], [place], [place] [address]    Special Requests   Final    BOTTLES DRAWN AEROBIC AND ANAEROBIC Blood Culture adequate volume Performed at [facility] [address] [place], [place] [address]    Culture   Final    NO GROWTH 5 DAYS Performed at [facility], [place] [address] [place], [place] [address]    Report Status 01/25/2022 FINAL  Final  Blood Cultures x 2 sites     Status: None   Collection Time: 01/20/22  3:55 PM   Specimen: BLOOD  Result Value Ref Range Status   Specimen Description   Final    BLOOD BLOOD LEFT ARM Performed at [facility] [address], [place], [place] [address]  Special Requests   Final    BOTTLES DRAWN AEROBIC AND ANAEROBIC Blood Culture results may not be optimal due to an inadequate volume of blood received in culture bottles Performed at [facility] [address], [place], [place] [address]    Culture   Final    NO GROWTH 5 DAYS Performed at [facility], [place] [address], [place], [place] [address]    Report Status 01/25/2022 FINAL  Final  MRSA Next Gen by PCR, Nasal     Status: Abnormal   Collection Time: 01/20/22 11:23 PM   Specimen: Nasal Mucosa; Nasal Swab  Result Value Ref Range Status   MRSA by PCR Next Gen DETECTED (A) NOT DETECTED Final    Comment: CRITICAL RESULT CALLED TO, READ BACK BY AND VERIFIED WITH: ESTABEAN,B RN AT 0131 ON 01/21/22 BY VAZQUEZJ (NOTE) The GeneXpert MRSA Assay (FDA approved for NASAL specimens only), is one component of a comprehensive MRSA colonization surveillance program. It is not intended to  diagnose MRSA infection nor to guide or monitor treatment for MRSA infections. Test performance is not FDA approved in patients less than 88 years old. Performed at [facility], [place] [address], [place], [place] [address]     Labs: CBC: Recent Labs  Lab 01/23/22 0357 01/24/22 0318 01/25/22 0325  WBC 14.3* 11.8* 11.4*  NEUTROABS 11.2* 9.0* 8.5*  HGB 9.6* 8.8* 9.3*  HCT 32.4* 29.6* 31.2*  MCV 98.2 99.0 97.8  PLT 373 342 719   Basic Metabolic Panel: Recent Labs  Lab 01/23/22 0357 01/24/22 0318 01/25/22 0325  NA 139 138 137  K 4.2 4.4 4.6  CL 105 105 102  CO2 _0 GLUCOSE 140* 188* 193*  BUN 62* 62* 65*  CREATININE 2.03* 2.12* 1.89*  CALCIUM 8.6* 8.3* 8.5*   Liver Function Tests: No results for input(s): "AST", "ALT", "ALKPHOS", "BILITOT", "PROT", "ALBUMIN" in the last 168 hours. CBG: Recent Labs  Lab 01/24/22 1146 01/24/22 1704 01/24/22 2159 01/25/22 0741 01/25/22 1152  GLUCAP 236* 299* 190* 202* 312*    Discharge time spent: 40 minutes   Signed: Hosie Poisson, MD [facility] 01/29/2022

## 2022-01-29 NOTE — Patient Instructions (Addendum)
Medication Instructions:  1.Increase torsemide to 120 mg (6 tablets) daily for the next 5 days then resume usual dose of 100 mg daily (5 tablets) *If you need a refill on your cardiac medications before your next appointment, please call your pharmacy*   Lab Work: BMP next week If you have labs (blood work) drawn today and your tests are completely normal, you will receive your results only by: [place] (if you have MyChart) OR A paper copy in the mail If you have any lab test that is abnormal or we need to change your treatment, we will call you to review the results.   Testing/Procedures: Your physician has requested that you have an echocardiogram, next available. Echocardiography is a painless test that uses sound waves to create images of your heart. It provides your doctor with information about the size and shape of your heart and how well your heart's chambers and valves are working. This procedure takes approximately one hour. There are no restrictions for this procedure.    Follow-Up: At [facility], you and your health needs are our priority.  As part of our continuing mission to provide you with exceptional heart care, we have created designated Provider Care Teams.  These Care Teams include your primary Cardiologist (physician) and Advanced Practice Providers (APPs -  Physician Assistants and Nurse Practitioners) who all work together to provide you with the care you need, when you need it.  We recommend signing up for the patient portal called "MyChart".  Sign up information is provided on this After Visit Summary.  MyChart is used to connect with patients for Virtual Visits (Telemedicine).  Patients are able to view lab/test results, encounter notes, upcoming appointments, etc.  Non-urgent messages can be sent to your provider as well.   To learn more about what you can do with MyChart, go to [email or web address].    Your next appointment:   6 week(s)  The  format for your next appointment:   In Person  Provider:   Candee Furbish, MD  or Nicholes Rough, PA-C       Other Instructions Your provider recommends that you weigh daily after using the restroom but before eating breakfast. Please keep a record of your readings.    Important Information About Sugar

## 2022-01-29 NOTE — Telephone Encounter (Signed)
-----   Message from Chesley Mires, MD sent at 01/29/2022 12:00 PM EDT ----- Carron Brazen, can you schedule her for a follow up with me or an NP?  Thanks.  V  ----- Message ----- From: Miguel Aschoff Sent: 01/29/2022  11:56 AM EDT To: Chesley Mires, MD  Hi Dr. Halford Chessman,   I saw this patient of yours today, Kayla [name].  On exam she had wheezing in all fields.  She does not seem well controlled on her current COPD regimen.  I wanted to see if maybe we could get her into see you for a little better control.  We are working on getting some fluid off as well which should help.  Thanks so much.  Elgie Collard, PA-C

## 2022-02-03 DIAGNOSIS — Z79891 Long term (current) use of opiate analgesic: Secondary | ICD-10-CM | POA: Diagnosis not present

## 2022-02-03 DIAGNOSIS — M353 Polymyalgia rheumatica: Secondary | ICD-10-CM | POA: Diagnosis not present

## 2022-02-03 DIAGNOSIS — Z1389 Encounter for screening for other disorder: Secondary | ICD-10-CM | POA: Diagnosis not present

## 2022-02-03 DIAGNOSIS — G894 Chronic pain syndrome: Secondary | ICD-10-CM | POA: Diagnosis not present

## 2022-02-03 DIAGNOSIS — M1710 Unilateral primary osteoarthritis, unspecified knee: Secondary | ICD-10-CM | POA: Diagnosis not present

## 2022-02-03 DIAGNOSIS — M25569 Pain in unspecified knee: Secondary | ICD-10-CM | POA: Diagnosis not present

## 2022-02-03 DIAGNOSIS — M549 Dorsalgia, unspecified: Secondary | ICD-10-CM | POA: Diagnosis not present

## 2022-02-03 DIAGNOSIS — M8580 Other specified disorders of bone density and structure, unspecified site: Secondary | ICD-10-CM | POA: Diagnosis not present

## 2022-02-03 DIAGNOSIS — E349 Endocrine disorder, unspecified: Secondary | ICD-10-CM | POA: Diagnosis not present

## 2022-02-03 NOTE — Progress Notes (Signed)
Remote pacemaker transmission.   

## 2022-02-05 ENCOUNTER — Ambulatory Visit: Payer: Medicare HMO | Attending: Physician Assistant

## 2022-02-05 DIAGNOSIS — Z79899 Other long term (current) drug therapy: Secondary | ICD-10-CM

## 2022-02-05 DIAGNOSIS — I5032 Chronic diastolic (congestive) heart failure: Secondary | ICD-10-CM

## 2022-02-05 DIAGNOSIS — I480 Paroxysmal atrial fibrillation: Secondary | ICD-10-CM | POA: Diagnosis not present

## 2022-02-05 DIAGNOSIS — I495 Sick sinus syndrome: Secondary | ICD-10-CM

## 2022-02-05 DIAGNOSIS — L03115 Cellulitis of right lower limb: Secondary | ICD-10-CM

## 2022-02-06 ENCOUNTER — Other Ambulatory Visit: Payer: Self-pay | Admitting: Pulmonary Disease

## 2022-02-06 LAB — BASIC METABOLIC PANEL
BUN/Creatinine Ratio: 24 (ref 12–28)
BUN: 45 mg/dL — ABNORMAL HIGH (ref 8–27)
CO2: 29 mmol/L (ref 20–29)
Calcium: 9 mg/dL (ref 8.7–10.3)
Chloride: 98 mmol/L (ref 96–106)
Creatinine, Ser: 1.86 mg/dL — ABNORMAL HIGH (ref 0.57–1.00)
Glucose: 214 mg/dL — ABNORMAL HIGH (ref 70–99)
Potassium: 5.1 mmol/L (ref 3.5–5.2)
Sodium: 141 mmol/L (ref 134–144)
eGFR: 27 mL/min/{1.73_m2} — ABNORMAL LOW (ref >59)

## 2022-02-09 ENCOUNTER — Other Ambulatory Visit: Payer: Self-pay | Admitting: *Deleted

## 2022-02-09 DIAGNOSIS — I5032 Chronic diastolic (congestive) heart failure: Secondary | ICD-10-CM

## 2022-02-09 DIAGNOSIS — Z79899 Other long term (current) drug therapy: Secondary | ICD-10-CM

## 2022-02-09 MED ORDER — METOLAZONE 5 MG PO TABS: ORAL_TABLET | ORAL | 0 refills | Status: DC

## 2022-02-09 NOTE — Telephone Encounter (Signed)
Called and scheduled an appt with Rexene Edison NP in [place] office. Nothing further needed at this time.

## 2022-02-11 ENCOUNTER — Ambulatory Visit (HOSPITAL_COMMUNITY): Payer: Medicare HMO | Attending: Physician Assistant

## 2022-02-11 ENCOUNTER — Ambulatory Visit (INDEPENDENT_AMBULATORY_CARE_PROVIDER_SITE_OTHER): Payer: Medicare HMO | Admitting: Podiatry

## 2022-02-11 DIAGNOSIS — I5032 Chronic diastolic (congestive) heart failure: Secondary | ICD-10-CM | POA: Diagnosis not present

## 2022-02-11 DIAGNOSIS — Z89422 Acquired absence of other left toe(s): Secondary | ICD-10-CM | POA: Diagnosis not present

## 2022-02-11 NOTE — Progress Notes (Signed)
Subjective:  Patient ID: Kayla [name], female    DOB: Feb 15, 1941,  MRN: [id number]  Chief Complaint  Patient presents with   Routine Post Op    pov #2 DOS 01/22/22 left 4th toe hospital amputation    DOS: 01/22/2022 Procedure: Left fourth digit amputation  81 y.o. female returns for post-op check.  Patient states that she is doing well.  Minimal pain.  She denies any other acute complaints here to get her stitches out. Review of Systems: Negative except as noted in the HPI. Denies N/V/F/Ch.  Past Medical History:  Diagnosis Date   Allergy    Anxiety    Arthritis    CAD (coronary artery disease)    Mild per remote cath in 2006, /    Nuclear, January, 2013, no significant abnormality,   Carotid artery disease ([place])    Doppler, January, 2013, 0 96% RIC A., 29-52% LICA, stable   Cataract    CHF (congestive heart failure) ([place])    Chronic kidney disease    Complication of anesthesia    wakes up "mean"   COPD 02/16/2007   Intolerant of Spiriva, tudorza Intolerant of Trelegy    COPD (chronic obstructive pulmonary disease) ([place])    Coronary atherosclerosis 11/24/2008   Catheterization 2006, nonobstructive coronary disease   //   nuclear 2013, low risk     Diabetes mellitus, type 2 ([place])    Diverticula of colon    DIVERTICULOSIS OF COLON 03/23/2007   Qualifier: Diagnosis of  By: Halford Chessman MD, [place]     Dizziness    occasional   Dizzy spells 05/04/2011   DM (diabetes mellitus) ([place]) 03/23/2007   Qualifier: Diagnosis of  By: Halford Chessman MD, Vine[place]     Dyspnea    On exertion   Ejection fraction    Essential hypertension 02/16/2007   Qualifier: Diagnosis of  By: Rosana Hoes CMA, Tammy     G E R D 03/23/2007   Qualifier: Diagnosis of  By: Halford Chessman MD, [place]     Gall bladder disease 04/28/2016   Gastritis and gastroduodenitis    GERD (gastroesophageal reflux disease)    Goiter    hx of   Headache(784.0)    migraine   Hyperlipidemia    Hypertension    Hypothyroidism    HYPOTHYROIDISM,  POSTSURGICAL 06/04/2008   Qualifier: Diagnosis of  By: Loanne Drilling MD, Sean A    Left ventricular hypertrophy    Leg edema    occasional swelling   Leg swelling 12/09/2018   Myofascial pain syndrome, cervical 01/06/2018   Obesity    OBESITY 11/24/2008   Qualifier: Diagnosis of  By: Sidney Ace     OBSTRUCTIVE SLEEP APNEA 02/16/2007   Auto CPAP 09/03/13 to 10/02/13 >> used on 30 of 30 nights with average 6 hrs and 3 min.  Average AHI is 3.1 with median CPAP 5 cm H2O and 95 th percentile CPAP 6 cm H2O.     Occipital neuralgia of right side 01/06/2018   OSA (obstructive sleep apnea)    cpap setting of 12   Pain in joint of right hip 01/21/2018   Palpitations 01/21/2011   48 hour Holter, 2015, scattered PACs and PVCs.    Paroxysmal atrial fibrillation (HCC)    Pneumonia    PONV (postoperative nausea and vomiting)    severe after every surgery   Pulmonary hypertension, secondary    RHINITIS 07/21/2010        RUQ abdominal pain    S/P left TKA 07/27/2011  Sinus node dysfunction (HCC) 11/08/2015   Sleep apnea    Spinal stenosis of cervical region 11/07/2014   Urinary frequency 12/09/2018   UTI (urinary tract infection) 08/15/2019   per pt report   VENTRICULAR HYPERTROPHY, LEFT 11/24/2008   Qualifier: Diagnosis of  By: Sidney Ace      Current Outpatient Medications:    acetaminophen (TYLENOL) 500 MG tablet, Take 1,000-1,500 mg by mouth every 8 (eight) hours as needed for mild pain, moderate pain or headache., Disp: , Rfl:    albuterol (PROVENTIL) (2.5 MG/3ML) 0.083% nebulizer solution, Take 3 mLs (2.5 mg total) by nebulization every 2 (two) hours as needed for wheezing or shortness of breath. (Patient taking differently: Take 2.5 mg by nebulization every 4 (four) hours.), Disp: 75 mL, Rfl: 5   albuterol (VENTOLIN HFA) 108 (90 Base) MCG/ACT inhaler, INHALE 2 PUFFS INTO THE LUNGS EVERY 6 HOURS AS NEEDED FOR WHEEZING OR SHORTNESS OF BREATH (Patient taking differently: Inhale 2 puffs  into the lungs See admin instructions. Inhale 2 puffs every morning & at bedtime and an additional 2 puffs up to twice a day as needed for shortness of breath or wheezing), Disp: 3 each, Rfl: 3   Alcohol Swabs (B-D SINGLE USE SWABS REGULAR) PADS, , Disp: , Rfl:    allopurinol (ZYLOPRIM) 100 MG tablet, Take 100 mg by mouth 2 (two) times daily., Disp: , Rfl:    amiodarone (PACERONE) 200 MG tablet, Take 1 tablet (200 mg total) by mouth daily., Disp: 90 tablet, Rfl: 3   amLODipine (NORVASC) 10 MG tablet, Take 1 tablet (10 mg total) by mouth daily., Disp: 30 tablet, Rfl: 3   apixaban (ELIQUIS) 2.5 MG TABS tablet, Take 1 tablet (2.5 mg total) by mouth 2 (two) times daily., Disp: 60 tablet, Rfl: 1   ascorbic acid (VITAMIN C) 500 MG tablet, Take 500 mg by mouth daily with lunch., Disp: , Rfl:    atorvastatin (LIPITOR) 40 MG tablet, Take 40 mg by mouth daily., Disp: , Rfl:    Blood Glucose Calibration (TRUE METRIX LEVEL 1) Low SOLN, , Disp: , Rfl:    BREZTRI AEROSPHERE 160-9-4.8 MCG/ACT AERO, INHALE 2 PUFFS INTO THE LUNGS IN THE MORNING AND AT BEDTIME., Disp: 3 each, Rfl: 10   Carboxymethylcellulose Sodium (ARTIFICIAL TEARS OP), Place 1 drop into both eyes in the morning and at bedtime., Disp: , Rfl:    ferrous sulfate 325 (65 FE) MG tablet, Take 1 tablet (325 mg total) by mouth daily with breakfast. (Patient taking differently: Take 325 mg by mouth daily with lunch.), Disp: 90 tablet, Rfl: 3   fluticasone (FLONASE) 50 MCG/ACT nasal spray, USE 2 SPRAYS NASALLY DAILY (Patient taking differently: Place 2 sprays into both nostrils at bedtime.), Disp: 48 g, Rfl: 1   gabapentin (NEURONTIN) 800 MG tablet, Take 800 mg by mouth See admin instructions. Take 800 mg by mouth three to four times a day, Disp: , Rfl:    HYDROcodone-acetaminophen (NORCO) 10-325 MG tablet, Take 1 tablet by mouth See admin instructions. Take 1 tablet by mouth three to four times a day, Disp: , Rfl:    insulin aspart (NOVOLOG) 100 UNIT/ML  FlexPen, Inject 10-17 Units into the skin See admin instructions. Inject 10-17 units into the skin three times a day with meals "as needed," per SLIDING SCALE, Disp: , Rfl:    insulin glargine (LANTUS) 100 UNIT/ML injection, Inject 0.55 mLs (55 Units total) into the skin 2 (two) times daily. (Patient taking differently: Inject 50-90 Units into  the skin 2 (two) times daily after a meal.), Disp: 10 mL, Rfl: 11   levothyroxine (SYNTHROID) 200 MCG tablet, Take 200 mcg by mouth daily before breakfast., Disp: , Rfl:    metolazone (ZAROXOLYN) 5 MG tablet, Take one tablet by mouth daily for 3 days 30 minutes before taking your torsemide., Disp: 3 tablet, Rfl: 0   metoprolol succinate (TOPROL-XL) 100 MG 24 hr tablet, TAKE 1 TABLET EVERY DAY WITH OR IMMEDIATELY AFTER A MEAL (Patient taking differently: Take 100 mg by mouth in the morning.), Disp: 90 tablet, Rfl: 3   naloxone (NARCAN) nasal spray 4 mg/0.1 mL, Place 4 mg into the nose once as needed for opioid reversal., Disp: , Rfl:    nitroGLYCERIN (NITROSTAT) 0.4 MG SL tablet, Place 1 tablet (0.4 mg total) under the tongue every 5 (five) minutes as needed for chest pain., Disp: 25 tablet, Rfl: 4   PRESCRIPTION MEDICATION, CPAP- At bedtime and as needed during any time of rest, Disp: , Rfl:    torsemide (DEMADEX) 20 MG tablet, Take 5 tablets (100 mg total) by mouth daily., Disp: 135 tablet, Rfl: 3   TRUE METRIX BLOOD GLUCOSE TEST test strip, , Disp: , Rfl:    TRUEplus Lancets 28G MISC, , Disp: , Rfl:    vitamin B-12 (CYANOCOBALAMIN) 1000 MCG tablet, Take 1,000 mcg by mouth daily with lunch., Disp: , Rfl:    Vitamin D3 (VITAMIN D) 25 MCG tablet, Take 1,000 Units by mouth daily with lunch., Disp: , Rfl:   Social History   Tobacco Use  Smoking Status Former   Packs/day: 2.00   Years: 33.00   Total pack years: 66.00   Types: Cigarettes   Start date: 1966   Quit date: 05/04/1997   Years since quitting: 24.8  Smokeless Tobacco Never    Allergies  Allergen  Reactions   Dulaglutide Nausea Only, Other (See Comments) and Cough    TRULICITY made the patient sick to her stomach (only) several days after using it   Oxycodone Itching and Other (See Comments)    Pt still takes this med even thought it causes itching   Trelegy Ellipta [Fluticasone-Umeclidin-Vilant] Nausea And Vomiting, Other (See Comments) and Cough    Made the patient sick to her stomach (only) several days after using it    Objective:  There were no vitals filed for this visit. There is no height or weight on file to calculate BMI. Constitutional Well developed. Well nourished.  Vascular Foot warm and well perfused. Capillary refill normal to all digits.   Neurologic Normal speech. Oriented to person, place, and time. Epicritic sensation to light touch grossly present bilaterally.  Dermatologic Superficial dehiscence noted measuring 3 cm x 0.5 cm x 0.2 cm does not probe down to bone  Orthopedic: Mild tenderness to palpation noted about the surgical site.   Radiographs: None Assessment:   1. History of amputation of lesser toe of left foot ([place])     Plan:  Patient was evaluated and treated and all questions answered.  S/p foot surgery left -Progressing as expected post-operatively. -XR: See above -WB Status: Weightbearing as tolerated in surgical shoe -Sutures: Removed.  Superficial dehiscence noted.  Betadine wet-to-dry dressing applied once a day. -Medications: None -New Betadine dressing change once a day  No follow-ups on file.

## 2022-02-12 LAB — ECHOCARDIOGRAM COMPLETE
AR max vel: 1.6 cm2
AV Area VTI: 1.44 cm2
AV Area mean vel: 1.46 cm2
AV Mean grad: 13.9 mmHg
AV Peak grad: 23.1 mmHg
Ao pk vel: 2.41 m/s
Area-P 1/2: 3.66 cm2
S' Lateral: 3.2 cm

## 2022-02-13 NOTE — Chronic Care Management (AMB) (Unsigned)
  Care Coordination Note  02/13/2022 Name: Kayla [name] MRN: [id number] DOB: Nov 22, 1940  Kayla [name] is a 81 y.o. year old female who is a primary care patient of Hamrick, Lorin Mercy, MD and is actively engaged with the care management team. I reached out to [name] by phone today to assist with re-scheduling a follow up visit with the RN Case Manager  Follow up plan: 2nd Unsuccessful telephone outreach attempt made. A HIPAA compliant phone message was left for the patient providing contact information and requesting a return call.   Julian Hy, [place] Direct Dial: [phone]

## 2022-02-16 ENCOUNTER — Ambulatory Visit: Payer: Medicare HMO | Attending: Cardiology

## 2022-02-16 DIAGNOSIS — I5032 Chronic diastolic (congestive) heart failure: Secondary | ICD-10-CM

## 2022-02-16 DIAGNOSIS — Z79899 Other long term (current) drug therapy: Secondary | ICD-10-CM | POA: Diagnosis not present

## 2022-02-17 LAB — BASIC METABOLIC PANEL
BUN/Creatinine Ratio: 22 (ref 12–28)
BUN: 47 mg/dL — ABNORMAL HIGH (ref 8–27)
CO2: 26 mmol/L (ref 20–29)
Calcium: 9.1 mg/dL (ref 8.7–10.3)
Chloride: 95 mmol/L — ABNORMAL LOW (ref 96–106)
Creatinine, Ser: 2.09 mg/dL — ABNORMAL HIGH (ref 0.57–1.00)
Glucose: 185 mg/dL — ABNORMAL HIGH (ref 70–99)
Potassium: 4.1 mmol/L (ref 3.5–5.2)
Sodium: 140 mmol/L (ref 134–144)
eGFR: 24 mL/min/{1.73_m2} — ABNORMAL LOW (ref >59)

## 2022-02-17 LAB — PRO B NATRIURETIC PEPTIDE: NT-Pro BNP: 699 pg/mL (ref 0–738)

## 2022-02-17 NOTE — Chronic Care Management (AMB) (Signed)
  Care Coordination Note  02/17/2022 Name: PHILIP ECKERSLEY MRN: [id number] DOB: [birth year]  Kayla [name] is a 81 y.o. year old female who is a primary care patient of Hamrick, Lorin Mercy, MD and is actively engaged with the care management team. I reached out to [name] by phone today to assist with re-scheduling an initial visit with the RN Case Manager  Follow up plan: We have been unable to make contact with the patient for follow up.   Julian Hy, [place] Direct Dial: [phone]

## 2022-02-20 ENCOUNTER — Ambulatory Visit (INDEPENDENT_AMBULATORY_CARE_PROVIDER_SITE_OTHER): Payer: Medicare HMO | Admitting: Adult Health

## 2022-02-20 ENCOUNTER — Ambulatory Visit (INDEPENDENT_AMBULATORY_CARE_PROVIDER_SITE_OTHER): Payer: Medicare HMO

## 2022-02-20 ENCOUNTER — Encounter: Payer: Self-pay | Admitting: Adult Health

## 2022-02-20 VITALS — BP 116/68 | HR 66 | Temp 97.6°F | Ht 65.0 in | Wt 242.6 lb

## 2022-02-20 DIAGNOSIS — G4733 Obstructive sleep apnea (adult) (pediatric): Secondary | ICD-10-CM

## 2022-02-20 DIAGNOSIS — R0781 Pleurodynia: Secondary | ICD-10-CM | POA: Diagnosis not present

## 2022-02-20 DIAGNOSIS — I5032 Chronic diastolic (congestive) heart failure: Secondary | ICD-10-CM | POA: Diagnosis not present

## 2022-02-20 DIAGNOSIS — J449 Chronic obstructive pulmonary disease, unspecified: Secondary | ICD-10-CM

## 2022-02-20 DIAGNOSIS — W19XXXD Unspecified fall, subsequent encounter: Secondary | ICD-10-CM

## 2022-02-20 DIAGNOSIS — J9611 Chronic respiratory failure with hypoxia: Secondary | ICD-10-CM | POA: Diagnosis not present

## 2022-02-20 DIAGNOSIS — N1832 Chronic kidney disease, stage 3b: Secondary | ICD-10-CM | POA: Diagnosis not present

## 2022-02-20 DIAGNOSIS — R079 Chest pain, unspecified: Secondary | ICD-10-CM | POA: Diagnosis not present

## 2022-02-20 MED ORDER — ALBUTEROL SULFATE (2.5 MG/3ML) 0.083% IN NEBU: 2.5000 mg | INHALATION_SOLUTION | RESPIRATORY_TRACT | 5 refills | Status: DC | PRN

## 2022-02-20 MED ORDER — ALBUTEROL SULFATE HFA 108 (90 BASE) MCG/ACT IN AERS: 2.0000 | INHALATION_SPRAY | Freq: Four times a day (QID) | RESPIRATORY_TRACT | 3 refills | Status: DC | PRN

## 2022-02-20 NOTE — Patient Instructions (Addendum)
Mucinex DM Twice daily  As needed  cough/congestion  Continue on BREZTRI 2 puffs Twice daily, rinse after use  Albuterol inhaler /Neb As needed   Flutter valve Twice daily   Chest xray today  Continue follow up with Cardiology and Nephrology  Warm to ribs and back as needed .  Continue on Oxygen 3l/m .  Continue on CPAP At bedtime   Follow up with Dr. Halford Chessman  in 6-8 weeks and As needed   Please contact office for sooner follow up if symptoms do not improve or worsen or seek emergency care

## 2022-02-20 NOTE — Progress Notes (Signed)
$'@Patient'e$  ID: Kayla [name], female    DOB: 01-16-1941, 81 y.o.   MRN: [id number]  Chief Complaint  Patient presents with   Follow-up    Referring provider: Jerline Pain, MD  HPI: 81 year old female former smoker followed for COPD , Chronic respiratory failure , and obstructive sleep apnea Medical history significant for insulin-dependent diabetes, A-fib on Eliquis and amiodarone, chronic anemia and diastolic heart failure, chronic kidney disease  TEST/EVENTS :  Spirometry 10/19/08 >> FEV1 1.55 (68%), FEV1% 70  Spirometry 08/29/12 >> FEV1 1.38 (59%), FEV1% 58 FeNO 03/09/16 >> 7   Chest Imaging:  HRCT chest 11/16/20 >> atherosclerosis, scattered areas of scarring, several tiny nodules up to 2 mm   Sleep Tests:  PSG 02/2005 >> AHI 10  CPAP titration 02/19/14 >> CPAP 11 cm H2O >> AHI 0.7, PLMI 81.4, did not need supplemental oxygen Auto CPAP 12/13/21 to 01/11/22 >> used on 29 of 30 nights with average 6 hrs 35 min.  Average AHI 2 with median CPAP 7 and 95 th percentile CPAP 11 cm H2O   Cardiac Tests:  Echo 10/03/20 >> EF 60 to 65%, severe LVH, grade 2 DD  02/20/2022 Follow up ; COPD and OSA  Patient returns for a 6-week follow-up.  Patient has underlying COPD with chronic bronchitis.  Has been intolerant of Trelegy Incruse and Spiriva.  She remains on Breztri twice daily.  Last visit was having a COPD flare.  She was treated with a prednisone taper.  Since last visit patient is feeling some better with decreased cough congestion.  However patient had a traumatic fall off of her deck 3 weeks ago.  She fell onto her right side and has had significant pain and soreness since then.  Emergency room notes reviewed CT head and cervical spine without acute process. Now has pleuritic pain and pain with turning .  Not using albuterol neb .Some flutter valve use.   Patient was admitted last month for left toe osteomyelitis with diabetic ulcer she was treated with IV antibiotics and fourth digit  toe amputation.  Hospital stay was complicated by decompensated heart failure requiring diuresis. Currently in wooden shoe. Does daily dressings at home. Says is doing well . Stitches were removed last week. No fever or drainage.   Patient has obstructive sleep apnea on nocturnal CPAP.  She says she is doing well on CPAP.  She wears it every single night.  Says she cannot sleep without it.  Feels that she benefits from CPAP with decreased daytime sleepiness.  CPAP download shows 90% compliance.  Daily average usage at 5.5 hours.  Patient is on auto CPAP 5 to 15 cm H2O.  AHI 3.1/hour.  She does have chronic pain and is on chronic narcotics with hydrocodone 3-4 times a day. Is on gabapentin 800 mg 4 times daily.  She does have diastolic heart failure.  Complicated by chronic kidney disease.  She is being followed by cardiology.  Eco 02/11/22 showed nml EF , Gr II DD.   Recently Demadex was briefly increased.  Metolazone was added for 3 days only . Helped some . Has chronic swelling. Currently on Demadex '20mg'$  4 tabs daily .   She remains on Oxygen 3l/m . Has POC . O2 sats have been doing okay. No increased O2 demands. Checks O2 sats at home, as long on Oxygen -sats stay good.     Allergies  Allergen Reactions   Dulaglutide Nausea Only, Other (See Comments) and Cough  TRULICITY made the patient sick to her stomach (only) several days after using it   Oxycodone Itching and Other (See Comments)    Pt still takes this med even thought it causes itching   Trelegy Ellipta [Fluticasone-Umeclidin-Vilant] Nausea And Vomiting, Other (See Comments) and Cough    Made the patient sick to her stomach (only) several days after using it     Immunization History  Administered Date(s) Administered   Fluad Quad(high Dose 65+) 01/04/2019, 01/30/2022   Influenza Split 03/05/2011, 01/08/2017, 02/10/2018   Influenza Whole 03/18/2009, 02/03/2010, 04/03/2012   Influenza, High Dose Seasonal PF 02/04/2016,  02/23/2017, 01/03/2020   Influenza,inj,Quad PF,6+ Mos 02/10/2013, 03/04/2014, 03/17/2015   Influenza,inj,quad, With Preservative 01/20/2018   Moderna Sars-Covid-2 Vaccination 05/17/2019, 06/13/2019, 03/05/2020   Pneumococcal Polysaccharide-23 02/03/2010   Tdap 03/04/2014   Zoster, Live 10/02/2013    Past Medical History:  Diagnosis Date   Allergy    Anxiety    Arthritis    CAD (coronary artery disease)    Mild per remote cath in 2006, /    Nuclear, January, 2013, no significant abnormality,   Carotid artery disease ([place])    Doppler, January, 2013, 0 09% RIC A., 98-33% LICA, stable   Cataract    CHF (congestive heart failure) ([place])    Chronic kidney disease    Complication of anesthesia    wakes up "mean"   COPD 02/16/2007   Intolerant of Spiriva, tudorza Intolerant of Trelegy    COPD (chronic obstructive pulmonary disease) ([place])    Coronary atherosclerosis 11/24/2008   Catheterization 2006, nonobstructive coronary disease   //   nuclear 2013, low risk     Diabetes mellitus, type 2 ([place])    Diverticula of colon    DIVERTICULOSIS OF COLON 03/23/2007   Qualifier: Diagnosis of  By: Halford Chessman MD, Vineet     Dizziness    occasional   Dizzy spells 05/04/2011   DM (diabetes mellitus) ([place]) 03/23/2007   Qualifier: Diagnosis of  By: Halford Chessman MD, Vineet     Dyspnea    On exertion   Ejection fraction    Essential hypertension 02/16/2007   Qualifier: Diagnosis of  By: Rosana Hoes CMA, Jaelani Posa     G E R D 03/23/2007   Qualifier: Diagnosis of  By: Halford Chessman MD, Vineet     Gall bladder disease 04/28/2016   Gastritis and gastroduodenitis    GERD (gastroesophageal reflux disease)    Goiter    hx of   Headache(784.0)    migraine   Hyperlipidemia    Hypertension    Hypothyroidism    HYPOTHYROIDISM, POSTSURGICAL 06/04/2008   Qualifier: Diagnosis of  By: Loanne Drilling MD, Sean A    Left ventricular hypertrophy    Leg edema    occasional swelling   Leg swelling 12/09/2018   Myofascial pain syndrome,  cervical 01/06/2018   Obesity    OBESITY 11/24/2008   Qualifier: Diagnosis of  By: Sidney Ace     OBSTRUCTIVE SLEEP APNEA 02/16/2007   Auto CPAP 09/03/13 to 10/02/13 >> used on 30 of 30 nights with average 6 hrs and 3 min.  Average AHI is 3.1 with median CPAP 5 cm H2O and 95 th percentile CPAP 6 cm H2O.     Occipital neuralgia of right side 01/06/2018   OSA (obstructive sleep apnea)    cpap setting of 12   Pain in joint of right hip 01/21/2018   Palpitations 01/21/2011   48 hour Holter, 2015, scattered PACs and PVCs.  Paroxysmal atrial fibrillation (HCC)    Pneumonia    PONV (postoperative nausea and vomiting)    severe after every surgery   Pulmonary hypertension, secondary    RHINITIS 07/21/2010        RUQ abdominal pain    S/P left TKA 07/27/2011   Sinus node dysfunction (HCC) 11/08/2015   Sleep apnea    Spinal stenosis of cervical region 11/07/2014   Urinary frequency 12/09/2018   UTI (urinary tract infection) 08/15/2019   per pt report   VENTRICULAR HYPERTROPHY, LEFT 11/24/2008   Qualifier: Diagnosis of  By: Sidney Ace      Tobacco History: Social History   Tobacco Use  Smoking Status Former   Packs/day: 2.00   Years: 33.00   Total pack years: 66.00   Types: Cigarettes   Start date: 1966   Quit date: 05/04/1997   Years since quitting: 24.8  Smokeless Tobacco Never   Counseling given: Not Answered   Outpatient Medications Prior to Visit  Medication Sig Dispense Refill   acetaminophen (TYLENOL) 500 MG tablet Take 1,000-1,500 mg by mouth every 8 (eight) hours as needed for mild pain, moderate pain or headache.     albuterol (PROVENTIL) (2.5 MG/3ML) 0.083% nebulizer solution Take 3 mLs (2.5 mg total) by nebulization every 2 (two) hours as needed for wheezing or shortness of breath. (Patient taking differently: Take 2.5 mg by nebulization every 4 (four) hours.) 75 mL 5   albuterol (VENTOLIN HFA) 108 (90 Base) MCG/ACT inhaler INHALE 2 PUFFS INTO THE LUNGS  EVERY 6 HOURS AS NEEDED FOR WHEEZING OR SHORTNESS OF BREATH (Patient taking differently: Inhale 2 puffs into the lungs See admin instructions. Inhale 2 puffs every morning & at bedtime and an additional 2 puffs up to twice a day as needed for shortness of breath or wheezing) 3 each 3   Alcohol Swabs (B-D SINGLE USE SWABS REGULAR) PADS      allopurinol (ZYLOPRIM) 100 MG tablet Take 100 mg by mouth 2 (two) times daily.     amiodarone (PACERONE) 200 MG tablet Take 1 tablet (200 mg total) by mouth daily. 90 tablet 3   amLODipine (NORVASC) 10 MG tablet Take 1 tablet (10 mg total) by mouth daily. 30 tablet 3   apixaban (ELIQUIS) 2.5 MG TABS tablet Take 1 tablet (2.5 mg total) by mouth 2 (two) times daily. 60 tablet 1   ascorbic acid (VITAMIN C) 500 MG tablet Take 500 mg by mouth daily with lunch.     atorvastatin (LIPITOR) 40 MG tablet Take 40 mg by mouth daily.     Blood Glucose Calibration (TRUE METRIX LEVEL 1) Low SOLN      BREZTRI AEROSPHERE 160-9-4.8 MCG/ACT AERO INHALE 2 PUFFS INTO THE LUNGS IN THE MORNING AND AT BEDTIME. 3 each 10   Carboxymethylcellulose Sodium (ARTIFICIAL TEARS OP) Place 1 drop into both eyes in the morning and at bedtime.     ferrous sulfate 325 (65 FE) MG tablet Take 1 tablet (325 mg total) by mouth daily with breakfast. (Patient taking differently: Take 325 mg by mouth daily with lunch.) 90 tablet 3   fluticasone (FLONASE) 50 MCG/ACT nasal spray USE 2 SPRAYS NASALLY DAILY (Patient taking differently: Place 2 sprays into both nostrils at bedtime.) 48 g 1   gabapentin (NEURONTIN) 800 MG tablet Take 800 mg by mouth See admin instructions. Take 800 mg by mouth three to four times a day     HYDROcodone-acetaminophen (NORCO) 10-325 MG tablet Take 1 tablet by mouth See  admin instructions. Take 1 tablet by mouth three to four times a day     insulin aspart (NOVOLOG) 100 UNIT/ML FlexPen Inject 10-17 Units into the skin See admin instructions. Inject 10-17 units into the skin three times  a day with meals "as needed," per SLIDING SCALE     insulin glargine (LANTUS) 100 UNIT/ML injection Inject 0.55 mLs (55 Units total) into the skin 2 (two) times daily. (Patient taking differently: Inject 50-90 Units into the skin 2 (two) times daily after a meal.) 10 mL 11   levothyroxine (SYNTHROID) 200 MCG tablet Take 200 mcg by mouth daily before breakfast.     metolazone (ZAROXOLYN) 5 MG tablet Take one tablet by mouth daily for 3 days 30 minutes before taking your torsemide. 3 tablet 0   metoprolol succinate (TOPROL-XL) 100 MG 24 hr tablet TAKE 1 TABLET EVERY DAY WITH OR IMMEDIATELY AFTER A MEAL (Patient taking differently: Take 100 mg by mouth in the morning.) 90 tablet 3   naloxone (NARCAN) nasal spray 4 mg/0.1 mL Place 4 mg into the nose once as needed for opioid reversal.     nitroGLYCERIN (NITROSTAT) 0.4 MG SL tablet Place 1 tablet (0.4 mg total) under the tongue every 5 (five) minutes as needed for chest pain. 25 tablet 4   PRESCRIPTION MEDICATION CPAP- At bedtime and as needed during any time of rest     torsemide (DEMADEX) 20 MG tablet Take 5 tablets (100 mg total) by mouth daily. 135 tablet 3   TRUE METRIX BLOOD GLUCOSE TEST test strip      TRUEplus Lancets 28G MISC      vitamin B-12 (CYANOCOBALAMIN) 1000 MCG tablet Take 1,000 mcg by mouth daily with lunch.     Vitamin D3 (VITAMIN D) 25 MCG tablet Take 1,000 Units by mouth daily with lunch.     No facility-administered medications prior to visit.     Review of Systems:   Constitutional:   No  weight loss, night sweats,  Fevers, chills,  +fatigue, or  lassitude.  HEENT:   No headaches,  Difficulty swallowing,  Tooth/dental problems, or  Sore throat,                No sneezing, itching, ear ache, nasal congestion, post nasal drip,   CV:  No chest pain,  Orthopnea, PND, swelling in lower extremities, anasarca, dizziness, palpitations, syncope.   GI  No heartburn, indigestion, abdominal pain, nausea, vomiting, diarrhea, change  in bowel habits, loss of appetite, bloody stools.   Resp: .  No chest wall deformity  Skin: no rash or lesions.  GU: no dysuria, change in color of urine, no urgency or frequency.  No flank pain, no hematuria   MS:  No joint pain or swelling.  No decreased range of motion.  No back pain.    Physical Exam  BP 116/68 (BP Location: Left Arm, Patient Position: Sitting, Cuff Size: Large)   Pulse 66   Temp 97.6 F (36.4 C) (Oral)   Ht '5\' 5"'$  (1.651 m)   Wt 242 lb 9.6 oz (110 kg)   SpO2 95%   BMI 40.37 kg/m   GEN: A/Ox3; pleasant , NAD, well nourished    HEENT:  [place]/AT,   NOSE-clear, THROAT-clear, no lesions, no postnasal drip or exudate noted.   NECK:  Supple w/ fair ROM; no JVD; normal carotid impulses w/o bruits; no thyromegaly or nodules palpated; no lymphadenopathy.    RESP few trace rhonchi  no accessory muscle use, no  dullness to percussion  CARD:  RRR, no m/r/g, 1+ peripheral edema, pulses intact, no cyanosis or clubbing Stasis dermatitis changes.  GI:   Soft & nt; nml bowel sounds; no organomegaly or masses detected.   Musco: Warm bil, left wooden shoe, bandage   Neuro: alert, no focal deficits noted.    Skin: Warm, no lesions or rashes    Lab Results:   BMET     Imaging: ECHOCARDIOGRAM COMPLETE  Result Date: 02/12/2022    ECHOCARDIOGRAM REPORT   Patient Name:   CARLISS PORCARO Date of Exam: 02/11/2022 Medical Rec #:  [id number]        Height:       65.0 in Accession #:    [id number]       Weight:       250.0 lb Date of Birth:  [birth year]       BSA:          2.175 m Patient Age:    75 years         BP:           138/60 mmHg Patient Gender: F                HR:           75 bpm. Exam Location:  [address] Procedure: 2D Echo, Cardiac Doppler and Color Doppler Indications:    I50.32 CHF  History:        Patient has prior history of Echocardiogram examinations, most                 recent 10/03/2020. CHF, CAD, COPD, Arrythmias:Paroxysmal atrial                  fibrillation; Risk Factors:Hypertension, Dyslipidemia and                 Diabetes.  Sonographer:    Coralyn Helling RDCS Referring Phys: 20 TESSA N CONTE  Sonographer Comments: Technically difficult study due to poor echo windows. Image acquisition challenging due to patient body habitus, Image acquisition challenging due to COPD and Image acquisition challenging due to respiratory motion. IMPRESSIONS  1. Left ventricular ejection fraction, by estimation, is 60 to 65%. The left ventricle has normal function. The left ventricle has no regional wall motion abnormalities. There is moderate left ventricular hypertrophy. Left ventricular diastolic parameters are consistent with Grade II diastolic dysfunction (pseudonormalization).  2. Right ventricular systolic function is normal. The right ventricular size is normal. Tricuspid regurgitation signal is inadequate for assessing PA pressure.  3. Left atrial size was mildly dilated.  4. The mitral valve is degenerative. Trivial mitral valve regurgitation. No evidence of mitral stenosis.  5. The aortic valve is abnormal. There is moderate calcifcation of the aortic valve and severe calcification of the annulus. Aortic valve regurgitation is not visualized. Mild aortic valve stenosis. Aortic valve area, by VTI measures 1.44 cm. Aortic valve mean gradient measures 13.9 mmHg. Aortic valve Vmax measures 2.41 m/s.  6. The inferior vena cava is normal in size with greater than 50% respiratory variability, suggesting right atrial pressure of 3 mmHg. FINDINGS  Left Ventricle: Left ventricular ejection fraction, by estimation, is 60 to 65%. The left ventricle has normal function. The left ventricle has no regional wall motion abnormalities. The left ventricular internal cavity size was normal in size. There is  moderate left ventricular hypertrophy. Left ventricular diastolic parameters are consistent with Grade II diastolic dysfunction (pseudonormalization). Right Ventricle: The  right ventricular  size is normal. No increase in right ventricular wall thickness. Right ventricular systolic function is normal. Tricuspid regurgitation signal is inadequate for assessing PA pressure. Left Atrium: Left atrial size was mildly dilated. Right Atrium: Right atrial size was normal in size. Pericardium: There is no evidence of pericardial effusion. Mitral Valve: The mitral valve is degenerative in appearance. Mild to moderate mitral annular calcification. Trivial mitral valve regurgitation. No evidence of mitral valve stenosis. Tricuspid Valve: The tricuspid valve is normal in structure. Tricuspid valve regurgitation is trivial. No evidence of tricuspid stenosis. Aortic Valve: The aortic valve is abnormal. There is severe calcifcation of the aortic valve. Aortic valve regurgitation is not visualized. Mild aortic stenosis is present. Aortic valve mean gradient measures 13.9 mmHg. Aortic valve peak gradient measures 23.1 mmHg. Aortic valve area, by VTI measures 1.44 cm. Pulmonic Valve: The pulmonic valve was normal in structure. Pulmonic valve regurgitation is trivial. No evidence of pulmonic stenosis. Aorta: The aortic root is normal in size and structure. Venous: The inferior vena cava is normal in size with greater than 50% respiratory variability, suggesting right atrial pressure of 3 mmHg. IAS/Shunts: No atrial level shunt detected by color flow Doppler. Additional Comments: A device lead is visualized in the right atrium and right ventricle.  LEFT VENTRICLE PLAX 2D LVIDd:         4.50 cm   Diastology LVIDs:         3.20 cm   LV e' medial:    5.11 cm/s LV PW:         1.50 cm   LV E/e' medial:  24.7 LV IVS:        1.50 cm   LV e' lateral:   8.38 cm/s LVOT diam:     2.20 cm   LV E/e' lateral: 15.0 LV SV:         95 LV SV Index:   44 LVOT Area:     3.80 cm  RIGHT VENTRICLE         IVC TAPSE (M-mode): 2.4 cm  IVC diam: 1.70 cm LEFT ATRIUM             Index        RIGHT ATRIUM           Index LA diam:         4.60 cm 2.12 cm/m   RA Pressure: 3.00 mmHg LA Vol (A2C):   79.0 ml 36.32 ml/m  RA Area:     10.10 cm LA Vol (A4C):   77.1 ml 35.45 ml/m  RA Volume:   22.10 ml  10.16 ml/m LA Biplane Vol: 79.3 ml 36.46 ml/m  AORTIC VALVE AV Area (Vmax):    1.60 cm AV Area (Vmean):   1.46 cm AV Area (VTI):     1.44 cm AV Vmax:           240.52 cm/s AV Vmean:          177.271 cm/s AV VTI:            0.659 m AV Peak Grad:      23.1 mmHg AV Mean Grad:      13.9 mmHg LVOT Vmax:         101.00 cm/s LVOT Vmean:        68.300 cm/s LVOT VTI:          0.249 m LVOT/AV VTI ratio: 0.38  AORTA Ao Root diam: 3.20 cm Ao Asc diam:  3.20 cm MITRAL VALVE  TRICUSPID VALVE MV Area (PHT): 3.66 cm     Estimated RAP:  3.00 mmHg MV Decel Time: 207 msec MV E velocity: 126.00 cm/s  SHUNTS MV A velocity: 114.00 cm/s  Systemic VTI:  0.25 m MV E/A ratio:  1.11         Systemic Diam: 2.20 cm Cherlynn Kaiser MD Electronically signed by Cherlynn Kaiser MD Signature Date/Time: 02/12/2022/2:34:43 PM    Final    DG Chest 2 View  Result Date: 01/23/2022 CLINICAL DATA:  Shortness of breath. EXAM: CHEST - 2 VIEW COMPARISON:  Chest x-ray dated 12/12/2021. FINDINGS: Borderline cardiomegaly. LEFT chest wall pacemaker/ICD apparatus appears stable in position. Lungs are clear. No pleural effusion or pneumothorax is seen. No acute-appearing osseous abnormality. IMPRESSION: No active cardiopulmonary disease. No evidence of pneumonia or pulmonary edema. Borderline cardiomegaly. Electronically Signed   By: Franki Cabot M.D.   On: 01/23/2022 15:35   DG Foot Complete Left  Result Date: 01/22/2022 CLINICAL DATA:  Postoperative left foot EXAM: LEFT FOOT - COMPLETE 3+ VIEW COMPARISON:  Left foot radiographs 01/20/2022, MRI left foot 01/21/2022 FINDINGS: Interval amputation of the fourth toe phalanges. Unchanged small 5 mm less than 1 mm linear metallic foreign body overlying the medial fifth toe. Unchanged acute to subacute fracture of the medial  and lateral aspects of the base of the proximal phalanx of the second toe. Unchanged remote amputation of the distal phalanx of the great toe. Moderate plantar calcaneal heel spur. IMPRESSION: Compared to 01/20/2022: 1. Interval amputation of the fourth toe phalanges. 2. Otherwise, no significant change from prior. 3. Acute to subacute fracture of the base of the proximal phalanx of the second toe. Electronically Signed   By: Yvonne Kendall M.D.   On: 01/22/2022 14:03   MRI Left foot without contrast  Result Date: 01/21/2022 CLINICAL DATA:  Osteomyelitis suspected. EXAM: MRI OF THE LEFT FOOT WITHOUT CONTRAST TECHNIQUE: Multiplanar, multisequence MR imaging of the left was performed. No intravenous contrast was administered. COMPARISON:  Radiographs dated January 20, 2022 FINDINGS: Bones/Joint/Cartilage Postsurgical changes for prior amputation of the first ray at the interphalangeal joint. Bone marrow edema of the proximal phalanx of the second digit, with cortical irregularity consistent with recent fracture. Erosion of the distal phalanx of the fourth digit and edema of the middle and distal phalanx of the fourth digit concerning for osteomyelitis. Susceptibility artifact about the base of the fifth metatarsal from the small metallic fragment. Hallux valgus deformity. Ligaments Collateral and cruciate ligaments are intact. Muscles and Tendons Muscles and tendons are intact. No intramuscular fluid collection or hematoma. Soft tissues Soft tissue edema prominent about the fourth digit with skin irregularity about the distal aspect of the fourth digit consistent with known open wound. Soft tissue swelling about the dorsum of the foot without evidence of fluid collection or hematoma. IMPRESSION: 1. Marrow edema of the middle and distal phalanx the fourth digit with erosion of the distal phalanx, which in the presence of adjacent deep skin wound and inflammatory changes consistent with acute osteomyelitis. 2.  Marrow edema of the proximal phalanx of the second digit with cortical irregularity suggesting recent fracture. 3. Postsurgical changes for prior amputation at the first interphalangeal joint with hallux valgus deformity. 4. Soft tissue edema about dorsum of the foot with deep skin wound about the distal aspect of the fourth digit. No fluid collection or abscess. Electronically Signed   By: Keane Police D.O.   On: 01/21/2022 11:30  No data to display          Lab Results  Component Value Date   NITRICOXIDE 7 03/09/2016        Assessment & Plan:   COPD (chronic obstructive pulmonary disease) (HCC) Recent exacerbation appears to be improving.  We will check chest x-ray today.  Recent fall with right-sided pleuritic symptoms. Continue on Breztri twice daily.  Increase mucociliary clearance patient is advised to try albuterol nebulizer and flutter valve. Hold on steroids and antibiotics at this time.  Plan  Patient Instructions  Mucinex DM Twice daily  As needed  cough/congestion  Continue on BREZTRI 2 puffs Twice daily, rinse after use  Albuterol inhaler /Neb As needed   Flutter valve Twice daily   Chest xray today  Continue follow up with Cardiology and Nephrology  Warm to ribs and back as needed .  Continue on Oxygen 3l/m .  Continue on CPAP At bedtime   Follow up with Dr. Halford Chessman  in 6-8 weeks and As needed   Please contact office for sooner follow up if symptoms do not improve or worsen or seek emergency care       OSA (obstructive sleep apnea) Continue on CPAP At bedtime -encouraged on CPAP compliance and usage.   Plan  Patient Instructions  Mucinex DM Twice daily  As needed  cough/congestion  Continue on BREZTRI 2 puffs Twice daily, rinse after use  Albuterol inhaler /Neb As needed   Flutter valve Twice daily   Chest xray today  Continue follow up with Cardiology and Nephrology  Warm to ribs and back as needed .  Continue on Oxygen 3l/m .  Continue on  CPAP At bedtime   Follow up with Dr. Halford Chessman  in 6-8 weeks and As needed   Please contact office for sooner follow up if symptoms do not improve or worsen or seek emergency care       Chronic respiratory failure with hypoxia ([place]) Continue on oxygen to maintain O2 saturations greater than 88 to 90%.  Chronic diastolic heart failure (HCC) Recent decompensated heart failure requiring diuresis during hospitalization.  Patient continues to follow-up with cardiology.  She has chronic kidney disease.  Today in the office appears somewhat euvolemic no evidence of excessive volume overload.  Continue on Demadex as directed by cardiology. Continue follow-up nephrology  Plan  Patient Instructions  Mucinex DM Twice daily  As needed  cough/congestion  Continue on BREZTRI 2 puffs Twice daily, rinse after use  Albuterol inhaler /Neb As needed   Flutter valve Twice daily   Chest xray today  Continue follow up with Cardiology and Nephrology  Warm to ribs and back as needed .  Continue on Oxygen 3l/m .  Continue on CPAP At bedtime   Follow up with Dr. Halford Chessman  in 6-8 weeks and As needed   Please contact office for sooner follow up if symptoms do not improve or worsen or seek emergency care       Pleuritic pain Right-sided pleuritic pain.  Recent fall 1 month ago with unrevealing chest x-ray.  We will repeat x-ray today.  Supportive care.  Warm heat to the ribs and back area.  Plan  Patient Instructions  Mucinex DM Twice daily  As needed  cough/congestion  Continue on BREZTRI 2 puffs Twice daily, rinse after use  Albuterol inhaler /Neb As needed   Flutter valve Twice daily   Chest xray today  Continue follow up with Cardiology and Nephrology  Warm to ribs  and back as needed .  Continue on Oxygen 3l/m .  Continue on CPAP At bedtime   Follow up with Dr. Halford Chessman  in 6-8 weeks and As needed   Please contact office for sooner follow up if symptoms do not improve or worsen or seek emergency care        Chronic kidney disease, stage 3b [facility]) Continue follow-up with nephrology    I spent   40 minutes dedicated to the care of this patient on the date of this encounter to include pre-visit review of records, face-to-face time with the patient discussing conditions above, post visit ordering of testing, clinical documentation with the electronic health record, making appropriate referrals as documented, and communicating necessary findings to members of the patients care team.   Rexene Edison, NP 02/20/2022

## 2022-02-20 NOTE — Assessment & Plan Note (Signed)
Continue on oxygen to maintain O2 saturations greater than 88 to 90%. 

## 2022-02-20 NOTE — Assessment & Plan Note (Signed)
Continue on CPAP At bedtime -encouraged on CPAP compliance and usage.   Plan  Patient Instructions  Mucinex DM Twice daily  As needed  cough/congestion  Continue on BREZTRI 2 puffs Twice daily, rinse after use  Albuterol inhaler /Neb As needed   Flutter valve Twice daily   Chest xray today  Continue follow up with Cardiology and Nephrology  Warm to ribs and back as needed .  Continue on Oxygen 3l/m .  Continue on CPAP At bedtime   Follow up with Dr. Halford Chessman  in 6-8 weeks and As needed   Please contact office for sooner follow up if symptoms do not improve or worsen or seek emergency care

## 2022-02-20 NOTE — Assessment & Plan Note (Signed)
Recent exacerbation appears to be improving.  We will check chest x-ray today.  Recent fall with right-sided pleuritic symptoms. Continue on Breztri twice daily.  Increase mucociliary clearance patient is advised to try albuterol nebulizer and flutter valve. Hold on steroids and antibiotics at this time.  Plan  Patient Instructions  Mucinex DM Twice daily  As needed  cough/congestion  Continue on BREZTRI 2 puffs Twice daily, rinse after use  Albuterol inhaler /Neb As needed   Flutter valve Twice daily   Chest xray today  Continue follow up with Cardiology and Nephrology  Warm to ribs and back as needed .  Continue on Oxygen 3l/m .  Continue on CPAP At bedtime   Follow up with Dr. Halford Chessman  in 6-8 weeks and As needed   Please contact office for sooner follow up if symptoms do not improve or worsen or seek emergency care

## 2022-02-20 NOTE — Assessment & Plan Note (Signed)
Recent decompensated heart failure requiring diuresis during hospitalization.  Patient continues to follow-up with cardiology.  She has chronic kidney disease.  Today in the office appears somewhat euvolemic no evidence of excessive volume overload.  Continue on Demadex as directed by cardiology. Continue follow-up nephrology  Plan  Patient Instructions  Mucinex DM Twice daily  As needed  cough/congestion  Continue on BREZTRI 2 puffs Twice daily, rinse after use  Albuterol inhaler /Neb As needed   Flutter valve Twice daily   Chest xray today  Continue follow up with Cardiology and Nephrology  Warm to ribs and back as needed .  Continue on Oxygen 3l/m .  Continue on CPAP At bedtime   Follow up with Dr. Halford Chessman  in 6-8 weeks and As needed   Please contact office for sooner follow up if symptoms do not improve or worsen or seek emergency care

## 2022-02-20 NOTE — Progress Notes (Signed)
Reviewed and agree with assessment/plan.   Chesley Mires, MD [facility] Pulmonary/Critical Care 02/20/2022, 10:22 AM Pager:  [phone]

## 2022-02-20 NOTE — Assessment & Plan Note (Signed)
Right-sided pleuritic pain.  Recent fall 1 month ago with unrevealing chest x-ray.  We will repeat x-ray today.  Supportive care.  Warm heat to the ribs and back area.  Plan  Patient Instructions  Mucinex DM Twice daily  As needed  cough/congestion  Continue on BREZTRI 2 puffs Twice daily, rinse after use  Albuterol inhaler /Neb As needed   Flutter valve Twice daily   Chest xray today  Continue follow up with Cardiology and Nephrology  Warm to ribs and back as needed .  Continue on Oxygen 3l/m .  Continue on CPAP At bedtime   Follow up with Dr. Halford Chessman  in 6-8 weeks and As needed   Please contact office for sooner follow up if symptoms do not improve or worsen or seek emergency care

## 2022-02-20 NOTE — Assessment & Plan Note (Signed)
Continue follow-up with nephrology. 

## 2022-02-20 NOTE — Addendum Note (Signed)
Addended by: Vanessa Barbara on: 02/20/2022 11:00 AM   Modules accepted: Orders

## 2022-02-23 NOTE — Progress Notes (Signed)
Called and spoke with patient, advised of results per Tammy Parrett NP.  She verbalized understanding.  Nothing further needed.

## 2022-02-25 DIAGNOSIS — I10 Essential (primary) hypertension: Secondary | ICD-10-CM | POA: Diagnosis not present

## 2022-02-25 DIAGNOSIS — E790 Hyperuricemia without signs of inflammatory arthritis and tophaceous disease: Secondary | ICD-10-CM | POA: Diagnosis not present

## 2022-02-25 DIAGNOSIS — E890 Postprocedural hypothyroidism: Secondary | ICD-10-CM | POA: Diagnosis not present

## 2022-02-25 DIAGNOSIS — E782 Mixed hyperlipidemia: Secondary | ICD-10-CM | POA: Diagnosis not present

## 2022-02-25 DIAGNOSIS — M069 Rheumatoid arthritis, unspecified: Secondary | ICD-10-CM | POA: Diagnosis not present

## 2022-02-25 DIAGNOSIS — E1140 Type 2 diabetes mellitus with diabetic neuropathy, unspecified: Secondary | ICD-10-CM | POA: Diagnosis not present

## 2022-02-25 DIAGNOSIS — N184 Chronic kidney disease, stage 4 (severe): Secondary | ICD-10-CM | POA: Diagnosis not present

## 2022-02-27 ENCOUNTER — Other Ambulatory Visit: Payer: Self-pay | Admitting: Podiatry

## 2022-02-27 ENCOUNTER — Ambulatory Visit (INDEPENDENT_AMBULATORY_CARE_PROVIDER_SITE_OTHER): Payer: Medicare HMO | Admitting: Podiatry

## 2022-02-27 DIAGNOSIS — Z89422 Acquired absence of other left toe(s): Secondary | ICD-10-CM

## 2022-02-27 MED ORDER — SANTYL 250 UNIT/GM EX OINT: 1.0000 | TOPICAL_OINTMENT | Freq: Every day | CUTANEOUS | 0 refills | Status: DC

## 2022-02-27 NOTE — Progress Notes (Signed)
Subjective:  Patient ID: Kayla [name], female    DOB: 30-Dec-1940,  MRN: [id number]  Chief Complaint  Patient presents with   Routine Post Op    2wk for DOS 01/22/22 left 4th toe hospital amputation     DOS: 01/22/2022 Procedure: Left fourth digit amputation  81 y.o. female returns for post-op check.  Patient states that she is doing well.  Minimal pain.  She denies any other acute complaints here to get her stitches out. Review of Systems: Negative except as noted in the HPI. Denies N/V/F/Ch.  Past Medical History:  Diagnosis Date   Allergy    Anxiety    Arthritis    CAD (coronary artery disease)    Mild per remote cath in 2006, /    Nuclear, January, 2013, no significant abnormality,   Carotid artery disease ([place])    Doppler, January, 2013, 0 08% RIC A., 67-61% LICA, stable   Cataract    CHF (congestive heart failure) ([place])    Chronic kidney disease    Complication of anesthesia    wakes up "mean"   COPD 02/16/2007   Intolerant of Spiriva, tudorza Intolerant of Trelegy    COPD (chronic obstructive pulmonary disease) ([place])    Coronary atherosclerosis 11/24/2008   Catheterization 2006, nonobstructive coronary disease   //   nuclear 2013, low risk     Diabetes mellitus, type 2 ([place])    Diverticula of colon    DIVERTICULOSIS OF COLON 03/23/2007   Qualifier: Diagnosis of  By: Halford Chessman MD, Vineet     Dizziness    occasional   Dizzy spells 05/04/2011   DM (diabetes mellitus) ([place]) 03/23/2007   Qualifier: Diagnosis of  By: Halford Chessman MD, Vineet     Dyspnea    On exertion   Ejection fraction    Essential hypertension 02/16/2007   Qualifier: Diagnosis of  By: Rosana Hoes CMA, Tammy     G E R D 03/23/2007   Qualifier: Diagnosis of  By: Halford Chessman MD, Vineet     Gall bladder disease 04/28/2016   Gastritis and gastroduodenitis    GERD (gastroesophageal reflux disease)    Goiter    hx of   Headache(784.0)    migraine   Hyperlipidemia    Hypertension    Hypothyroidism    HYPOTHYROIDISM,  POSTSURGICAL 06/04/2008   Qualifier: Diagnosis of  By: Loanne Drilling MD, Sean A    Left ventricular hypertrophy    Leg edema    occasional swelling   Leg swelling 12/09/2018   Myofascial pain syndrome, cervical 01/06/2018   Obesity    OBESITY 11/24/2008   Qualifier: Diagnosis of  By: Sidney Ace     OBSTRUCTIVE SLEEP APNEA 02/16/2007   Auto CPAP 09/03/13 to 10/02/13 >> used on 30 of 30 nights with average 6 hrs and 3 min.  Average AHI is 3.1 with median CPAP 5 cm H2O and 95 th percentile CPAP 6 cm H2O.     Occipital neuralgia of right side 01/06/2018   OSA (obstructive sleep apnea)    cpap setting of 12   Pain in joint of right hip 01/21/2018   Palpitations 01/21/2011   48 hour Holter, 2015, scattered PACs and PVCs.    Paroxysmal atrial fibrillation (HCC)    Pneumonia    PONV (postoperative nausea and vomiting)    severe after every surgery   Pulmonary hypertension, secondary    RHINITIS 07/21/2010        RUQ abdominal pain    S/P left TKA 07/27/2011  Sinus node dysfunction (HCC) 11/08/2015   Sleep apnea    Spinal stenosis of cervical region 11/07/2014   Urinary frequency 12/09/2018   UTI (urinary tract infection) 08/15/2019   per pt report   VENTRICULAR HYPERTROPHY, LEFT 11/24/2008   Qualifier: Diagnosis of  By: Sidney Ace      Current Outpatient Medications:    collagenase (SANTYL) 250 UNIT/GM ointment, Apply 1 Application topically daily., Disp: 15 g, Rfl: 0   acetaminophen (TYLENOL) 500 MG tablet, Take 1,000-1,500 mg by mouth every 8 (eight) hours as needed for mild pain, moderate pain or headache., Disp: , Rfl:    albuterol (PROVENTIL) (2.5 MG/3ML) 0.083% nebulizer solution, Take 3 mLs (2.5 mg total) by nebulization every 2 (two) hours as needed for wheezing or shortness of breath., Disp: 75 mL, Rfl: 5   albuterol (VENTOLIN HFA) 108 (90 Base) MCG/ACT inhaler, Inhale 2 puffs into the lungs every 6 (six) hours as needed for wheezing or shortness of breath., Disp: 3 each,  Rfl: 3   Alcohol Swabs (B-D SINGLE USE SWABS REGULAR) PADS, , Disp: , Rfl:    allopurinol (ZYLOPRIM) 100 MG tablet, Take 100 mg by mouth 2 (two) times daily., Disp: , Rfl:    amiodarone (PACERONE) 200 MG tablet, Take 1 tablet (200 mg total) by mouth daily., Disp: 90 tablet, Rfl: 3   amLODipine (NORVASC) 10 MG tablet, Take 1 tablet (10 mg total) by mouth daily., Disp: 30 tablet, Rfl: 3   apixaban (ELIQUIS) 2.5 MG TABS tablet, Take 1 tablet (2.5 mg total) by mouth 2 (two) times daily., Disp: 60 tablet, Rfl: 1   ascorbic acid (VITAMIN C) 500 MG tablet, Take 500 mg by mouth daily with lunch., Disp: , Rfl:    atorvastatin (LIPITOR) 40 MG tablet, Take 40 mg by mouth daily., Disp: , Rfl:    Blood Glucose Calibration (TRUE METRIX LEVEL 1) Low SOLN, , Disp: , Rfl:    BREZTRI AEROSPHERE 160-9-4.8 MCG/ACT AERO, INHALE 2 PUFFS INTO THE LUNGS IN THE MORNING AND AT BEDTIME., Disp: 3 each, Rfl: 10   Carboxymethylcellulose Sodium (ARTIFICIAL TEARS OP), Place 1 drop into both eyes in the morning and at bedtime., Disp: , Rfl:    ferrous sulfate 325 (65 FE) MG tablet, Take 1 tablet (325 mg total) by mouth daily with breakfast. (Patient taking differently: Take 325 mg by mouth daily with lunch.), Disp: 90 tablet, Rfl: 3   fluticasone (FLONASE) 50 MCG/ACT nasal spray, USE 2 SPRAYS NASALLY DAILY (Patient taking differently: Place 2 sprays into both nostrils at bedtime.), Disp: 48 g, Rfl: 1   gabapentin (NEURONTIN) 800 MG tablet, Take 800 mg by mouth See admin instructions. Take 800 mg by mouth three to four times a day, Disp: , Rfl:    HYDROcodone-acetaminophen (NORCO) 10-325 MG tablet, Take 1 tablet by mouth See admin instructions. Take 1 tablet by mouth three to four times a day, Disp: , Rfl:    insulin aspart (NOVOLOG) 100 UNIT/ML FlexPen, Inject 10-17 Units into the skin See admin instructions. Inject 10-17 units into the skin three times a day with meals "as needed," per SLIDING SCALE, Disp: , Rfl:    insulin  glargine (LANTUS) 100 UNIT/ML injection, Inject 0.55 mLs (55 Units total) into the skin 2 (two) times daily. (Patient taking differently: Inject 50-90 Units into the skin 2 (two) times daily after a meal.), Disp: 10 mL, Rfl: 11   levothyroxine (SYNTHROID) 200 MCG tablet, Take 200 mcg by mouth daily before breakfast., Disp: , Rfl:  metolazone (ZAROXOLYN) 5 MG tablet, Take one tablet by mouth daily for 3 days 30 minutes before taking your torsemide., Disp: 3 tablet, Rfl: 0   metoprolol succinate (TOPROL-XL) 100 MG 24 hr tablet, TAKE 1 TABLET EVERY DAY WITH OR IMMEDIATELY AFTER A MEAL (Patient taking differently: Take 100 mg by mouth in the morning.), Disp: 90 tablet, Rfl: 3   naloxone (NARCAN) nasal spray 4 mg/0.1 mL, Place 4 mg into the nose once as needed for opioid reversal., Disp: , Rfl:    nitroGLYCERIN (NITROSTAT) 0.4 MG SL tablet, Place 1 tablet (0.4 mg total) under the tongue every 5 (five) minutes as needed for chest pain., Disp: 25 tablet, Rfl: 4   PRESCRIPTION MEDICATION, CPAP- At bedtime and as needed during any time of rest, Disp: , Rfl:    torsemide (DEMADEX) 20 MG tablet, Take 5 tablets (100 mg total) by mouth daily., Disp: 135 tablet, Rfl: 3   TRUE METRIX BLOOD GLUCOSE TEST test strip, , Disp: , Rfl:    TRUEplus Lancets 28G MISC, , Disp: , Rfl:    vitamin B-12 (CYANOCOBALAMIN) 1000 MCG tablet, Take 1,000 mcg by mouth daily with lunch., Disp: , Rfl:    Vitamin D3 (VITAMIN D) 25 MCG tablet, Take 1,000 Units by mouth daily with lunch., Disp: , Rfl:   Social History   Tobacco Use  Smoking Status Former   Packs/day: 2.00   Years: 33.00   Total pack years: 66.00   Types: Cigarettes   Start date: 1966   Quit date: 05/04/1997   Years since quitting: 24.8  Smokeless Tobacco Never    Allergies  Allergen Reactions   Dulaglutide Nausea Only, Other (See Comments) and Cough    TRULICITY made the patient sick to her stomach (only) several days after using it   Oxycodone Itching and  Other (See Comments)    Pt still takes this med even thought it causes itching   Trelegy Ellipta [Fluticasone-Umeclidin-Vilant] Nausea And Vomiting, Other (See Comments) and Cough    Made the patient sick to her stomach (only) several days after using it    Objective:  There were no vitals filed for this visit. There is no height or weight on file to calculate BMI. Constitutional Well developed. Well nourished.  Vascular Foot warm and well perfused. Capillary refill normal to all digits.   Neurologic Normal speech. Oriented to person, place, and time. Epicritic sensation to light touch grossly present bilaterally.  Dermatologic Superficial dehiscence noted measuring 3 cm x 0.5 cm x 0.2 cm does not probe down to bone.  Still measuring the same  Orthopedic: Mild tenderness to palpation noted about the surgical site.   Radiographs: None Assessment:   1. History of amputation of lesser toe of left foot ([place])      Plan:  Patient was evaluated and treated and all questions answered.  S/p foot surgery left -Progressing as expected post-operatively. -XR: See above -WB Status: Weightbearing as tolerated in surgical shoe -Sutures: Removed.  Superficial dehiscence noted.  Do Santyl wet-to-dry dressing if there is some fibrotic tissue in the wound bed -Medications: None -Minimal debridement was performed  No follow-ups on file.

## 2022-03-03 DIAGNOSIS — M353 Polymyalgia rheumatica: Secondary | ICD-10-CM | POA: Diagnosis not present

## 2022-03-03 DIAGNOSIS — M25569 Pain in unspecified knee: Secondary | ICD-10-CM | POA: Diagnosis not present

## 2022-03-03 DIAGNOSIS — M8580 Other specified disorders of bone density and structure, unspecified site: Secondary | ICD-10-CM | POA: Diagnosis not present

## 2022-03-03 DIAGNOSIS — Z1389 Encounter for screening for other disorder: Secondary | ICD-10-CM | POA: Diagnosis not present

## 2022-03-03 DIAGNOSIS — G894 Chronic pain syndrome: Secondary | ICD-10-CM | POA: Diagnosis not present

## 2022-03-03 DIAGNOSIS — Z79891 Long term (current) use of opiate analgesic: Secondary | ICD-10-CM | POA: Diagnosis not present

## 2022-03-03 DIAGNOSIS — M549 Dorsalgia, unspecified: Secondary | ICD-10-CM | POA: Diagnosis not present

## 2022-03-03 DIAGNOSIS — E349 Endocrine disorder, unspecified: Secondary | ICD-10-CM | POA: Diagnosis not present

## 2022-03-03 DIAGNOSIS — M1710 Unilateral primary osteoarthritis, unspecified knee: Secondary | ICD-10-CM | POA: Diagnosis not present

## 2022-03-09 ENCOUNTER — Ambulatory Visit: Payer: Medicare HMO | Attending: Cardiology | Admitting: Cardiology

## 2022-03-09 ENCOUNTER — Encounter: Payer: Self-pay | Admitting: Cardiology

## 2022-03-09 VITALS — BP 132/70 | HR 74 | Ht 65.0 in | Wt 242.0 lb

## 2022-03-09 DIAGNOSIS — I480 Paroxysmal atrial fibrillation: Secondary | ICD-10-CM

## 2022-03-09 DIAGNOSIS — I5032 Chronic diastolic (congestive) heart failure: Secondary | ICD-10-CM

## 2022-03-09 MED ORDER — METOPROLOL SUCCINATE ER 100 MG PO TB24: ORAL_TABLET | ORAL | 3 refills | Status: DC

## 2022-03-09 NOTE — Patient Instructions (Signed)
Medication Instructions:  The current medical regimen is effective;  continue present plan and medications.  *If you need a refill on your cardiac medications before your next appointment, please call your pharmacy*  Follow-Up: At [facility], you and your health needs are our priority.  As part of our continuing mission to provide you with exceptional heart care, we have created designated Provider Care Teams.  These Care Teams include your primary Cardiologist (physician) and Advanced Practice Providers (APPs -  Physician Assistants and Nurse Practitioners) who all work together to provide you with the care you need, when you need it.  We recommend signing up for the patient portal called "MyChart".  Sign up information is provided on this After Visit Summary.  MyChart is used to connect with patients for Virtual Visits (Telemedicine).  Patients are able to view lab/test results, encounter notes, upcoming appointments, etc.  Non-urgent messages can be sent to your provider as well.   To learn more about what you can do with MyChart, go to [email or web address].    Your next appointment:   6 month(s)  The format for your next appointment:   In Person  Provider:   Nicholes Rough, PA-C     Then, Candee Furbish, MD will plan to see you again in 1 year(s).    Important Information About Sugar

## 2022-03-09 NOTE — Progress Notes (Signed)
Cardiology Office Note:    Date:  03/09/2022   ID:  VIERA OKONSKI, DOB 1940-07-04, MRN [id number]  PCP:  [name], MD   [facility] Providers Cardiologist:  Candee Furbish, MD Electrophysiologist:  Virl Axe, MD      Referring MD: Jerline Pain, MD    History of Present Illness:    Kayla [name] is a 81 y.o. female here for follow-up.   She was last seen on 09/02/2021 for numbness and tingling. She was accompanied by a family member. She believed that her right leg was doing better but still appeared erythematous. She was also experiencing significant right knee pain.   She was on oxygen. She reported while sitting still and not exerting herself, her breathing was fine. However, she became short of breath wit minimal exertion including walking around the house. She noticed her saturation dropped to 85% while walking.   She also complained of congestion and cough on last visit. At night, she sometimes began coughing and is unable to stop for a time.  Today, she has been doing well. She noted experiencing occasional shortness of breath with essential tremors.  She reports that her left leg has been doing well and advised to keep the bandage in place for 2 more weeks.   She denies any palpitations, chest pain, or peripheral edema. No lightheadedness, headaches, syncope, orthopnea, or PND.   Past Medical History:  Diagnosis Date   Allergy    Anxiety    Arthritis    CAD (coronary artery disease)    Mild per remote cath in 2006, /    Nuclear, January, 2013, no significant abnormality,   Carotid artery disease ([place])    Doppler, January, 2013, 0 94% RIC A., 85-46% LICA, stable   Cataract    CHF (congestive heart failure) ([place])    Chronic kidney disease    Complication of anesthesia    wakes up "mean"   COPD 02/16/2007   Intolerant of Spiriva, tudorza Intolerant of Trelegy    COPD (chronic obstructive pulmonary disease) ([place])    Coronary atherosclerosis  11/24/2008   Catheterization 2006, nonobstructive coronary disease   //   nuclear 2013, low risk     Diabetes mellitus, type 2 ([place])    Diverticula of colon    DIVERTICULOSIS OF COLON 03/23/2007   Qualifier: Diagnosis of  By: Halford Chessman MD, Vineet     Dizziness    occasional   Dizzy spells 05/04/2011   DM (diabetes mellitus) ([place]) 03/23/2007   Qualifier: Diagnosis of  By: Halford Chessman MD, Vineet     Dyspnea    On exertion   Ejection fraction    Essential hypertension 02/16/2007   Qualifier: Diagnosis of  By: Rosana Hoes CMA, Tammy     G E R D 03/23/2007   Qualifier: Diagnosis of  By: Halford Chessman MD, Vineet     Gall bladder disease 04/28/2016   Gastritis and gastroduodenitis    GERD (gastroesophageal reflux disease)    Goiter    hx of   Headache(784.0)    migraine   Hyperlipidemia    Hypertension    Hypothyroidism    HYPOTHYROIDISM, POSTSURGICAL 06/04/2008   Qualifier: Diagnosis of  By: Loanne Drilling MD, Sean A    Left ventricular hypertrophy    Leg edema    occasional swelling   Leg swelling 12/09/2018   Myofascial pain syndrome, cervical 01/06/2018   Obesity    OBESITY 11/24/2008   Qualifier: Diagnosis of  By: Sidney Ace  OBSTRUCTIVE SLEEP APNEA 02/16/2007   Auto CPAP 09/03/13 to 10/02/13 >> used on 30 of 30 nights with average 6 hrs and 3 min.  Average AHI is 3.1 with median CPAP 5 cm H2O and 95 th percentile CPAP 6 cm H2O.     Occipital neuralgia of right side 01/06/2018   OSA (obstructive sleep apnea)    cpap setting of 12   Pain in joint of right hip 01/21/2018   Palpitations 01/21/2011   48 hour Holter, 2015, scattered PACs and PVCs.    Paroxysmal atrial fibrillation (HCC)    Pneumonia    PONV (postoperative nausea and vomiting)    severe after every surgery   Pulmonary hypertension, secondary    RHINITIS 07/21/2010        RUQ abdominal pain    S/P left TKA 07/27/2011   Sinus node dysfunction (HCC) 11/08/2015   Sleep apnea    Spinal stenosis of cervical region 11/07/2014   Urinary  frequency 12/09/2018   UTI (urinary tract infection) 08/15/2019   per pt report   VENTRICULAR HYPERTROPHY, LEFT 11/24/2008   Qualifier: Diagnosis of  By: Sidney Ace      Past Surgical History:  Procedure Laterality Date   ABDOMINAL HYSTERECTOMY     with appendectomy and colon polypectomy   AMPUTATION TOE Left 12/15/2019   Procedure: AMPUTATION  LEFT GREAT TOE;  Surgeon: Evelina Bucy, DPM;  Location: [facility];  Service: Podiatry;  Laterality: Left;   AMPUTATION TOE Left 01/22/2022   Procedure: AMPUTATION TOE 4th digit;  Surgeon: Felipa Furnace, DPM;  Location: [facility];  Service: Podiatry;  Laterality: Left;   ANTERIOR CERVICAL DECOMP/DISCECTOMY FUSION N/A 11/07/2014   Procedure: Anterior Cervical diskectomy and fusion Cervical four-five, Cervical five-six, Cervical six-seven;  Surgeon: Kary Kos, MD;  Location: [place] [facility];  Service: Neurosurgery;  Laterality: N/A;   APPENDECTOMY     ARTERY BIOPSY Right 02/16/2017   Procedure: BIOPSY TEMPORAL ARTERY;  Surgeon: Katha Cabal, MD;  Location: [facility];  Service: Vascular;  Laterality: Right;   ARTERY BIOPSY Left 04/16/2020   Procedure: BIOPSY TEMPORAL ARTERY;  Surgeon: Katha Cabal, MD;  Location: [facility];  Service: Vascular;  Laterality: Left;  LEFT TEMPLE   BACK SURGERY     CARDIAC CATHETERIZATION     CATARACT EXTRACTION W/ INTRAOCULAR LENS  IMPLANT, BILATERAL Bilateral    CHOLECYSTECTOMY N/A 04/29/2016   Procedure: LAPAROSCOPIC CHOLECYSTECTOMY WITH INTRAOPERATIVE CHOLANGIOGRAM;  Surgeon: Alphonsa Overall, MD;  Location: [facility];  Service: General;  Laterality: N/A;   EP IMPLANTABLE DEVICE N/A 11/08/2015   MRI COMPATABLE -- Procedure: Pacemaker Implant;  Surgeon: Deboraha Sprang, MD;  Location: [place] [facility];  Service: Cardiovascular;  Laterality: N/A;   INCISION AND DRAINAGE ABSCESS Right 05/30/2021   Procedure: IRRIGATION AND DEBRIDEMENT OF PREPATELLA BURSA;  Surgeon: Paralee Cancel, MD;  Location: [facility];  Service:  Orthopedics;  Laterality: Right;  60 wants standard knee draping and pulse lavage   INCISION AND DRAINAGE ABSCESS Right 07/22/2021   Procedure: INCISION AND DRAINAGE ABSCESS RIGHT KNEE;  Surgeon: Paralee Cancel, MD;  [facility]: [facility];  Service: Orthopedics;  Laterality: Right;   INSERT / REPLACE / REMOVE PACEMAKER  2017   Dr. Jens Som [facility])   IR FLUORO GUIDE CV LINE RIGHT  07/25/2021   IR REMOVAL TUN CV CATH W/O FL  09/04/2021   IR US GUIDE VASC ACCESS RIGHT  07/25/2021   JOINT REPLACEMENT     LUMBAR DISC SURGERY  1990's  x 2   THYROIDECTOMY  2011   TOTAL KNEE ARTHROPLASTY  07/27/2011   Procedure: TOTAL KNEE ARTHROPLASTY;  Surgeon: Mauri Pole, MD;  [facility]: [facility];  Service: Orthopedics;  Laterality: Left;   UPPER ESOPHAGEAL ENDOSCOPIC ULTRASOUND (EUS) N/A 05/14/2016   Procedure: UPPER ESOPHAGEAL ENDOSCOPIC ULTRASOUND (EUS);  Surgeon: Milus Banister, MD;  Location: [facility];  Service: Endoscopy;  Laterality: N/A;  radial only-will be done mod if needed    Current Medications: Current Meds  Medication Sig   acetaminophen (TYLENOL) 500 MG tablet Take 1,000-1,500 mg by mouth every 8 (eight) hours as needed for mild pain, moderate pain or headache.   albuterol (PROVENTIL) (2.5 MG/3ML) 0.083% nebulizer solution Take 3 mLs (2.5 mg total) by nebulization every 2 (two) hours as needed for wheezing or shortness of breath.   albuterol (VENTOLIN HFA) 108 (90 Base) MCG/ACT inhaler Inhale 2 puffs into the lungs every 6 (six) hours as needed for wheezing or shortness of breath.   Alcohol Swabs (B-D SINGLE USE SWABS REGULAR) PADS    allopurinol (ZYLOPRIM) 100 MG tablet Take 100 mg by mouth 2 (two) times daily.   amiodarone (PACERONE) 200 MG tablet Take 1 tablet (200 mg total) by mouth daily.   amLODipine (NORVASC) 10 MG tablet Take 1 tablet (10 mg total) by mouth daily.   apixaban (ELIQUIS) 2.5 MG TABS tablet Take 1 tablet (2.5 mg total) by mouth 2 (two) times daily.   ascorbic acid (VITAMIN C)  500 MG tablet Take 500 mg by mouth daily with lunch.   atorvastatin (LIPITOR) 40 MG tablet Take 40 mg by mouth daily.   Blood Glucose Calibration (TRUE METRIX LEVEL 1) Low SOLN    BREZTRI AEROSPHERE 160-9-4.8 MCG/ACT AERO INHALE 2 PUFFS INTO THE LUNGS IN THE MORNING AND AT BEDTIME.   Carboxymethylcellulose Sodium (ARTIFICIAL TEARS OP) Place 1 drop into both eyes in the morning and at bedtime.   collagenase (SANTYL) 250 UNIT/GM ointment Apply 1 Application topically daily.   ferrous sulfate 325 (65 FE) MG tablet Take 1 tablet (325 mg total) by mouth daily with breakfast.   fluticasone (FLONASE) 50 MCG/ACT nasal spray USE 2 SPRAYS NASALLY DAILY   gabapentin (NEURONTIN) 800 MG tablet Take 800 mg by mouth See admin instructions. Take 800 mg by mouth three to four times a day   HYDROcodone-acetaminophen (NORCO) 10-325 MG tablet Take 1 tablet by mouth See admin instructions. Take 1 tablet by mouth three to four times a day   insulin aspart (NOVOLOG) 100 UNIT/ML FlexPen Inject 10-17 Units into the skin See admin instructions. Inject 10-17 units into the skin three times a day with meals "as needed," per SLIDING SCALE   insulin glargine (LANTUS) 100 UNIT/ML injection Inject 0.55 mLs (55 Units total) into the skin 2 (two) times daily. (Patient taking differently: Inject 50-90 Units into the skin 2 (two) times daily after a meal.)   levothyroxine (SYNTHROID) 200 MCG tablet Take 200 mcg by mouth daily before breakfast.   losartan (COZAAR) 25 MG tablet Take 25 mg by mouth daily.   metolazone (ZAROXOLYN) 5 MG tablet Take one tablet by mouth daily for 3 days 30 minutes before taking your torsemide.   naloxone (NARCAN) nasal spray 4 mg/0.1 mL Place 4 mg into the nose once as needed for opioid reversal.   nitroGLYCERIN (NITROSTAT) 0.4 MG SL tablet Place 1 tablet (0.4 mg total) under the tongue every 5 (five) minutes as needed for chest pain.   PRESCRIPTION MEDICATION CPAP- At  bedtime and as needed during any time  of rest   torsemide (DEMADEX) 20 MG tablet Take 5 tablets (100 mg total) by mouth daily.   TRUE METRIX BLOOD GLUCOSE TEST test strip    TRUEplus Lancets 28G MISC    vitamin B-12 (CYANOCOBALAMIN) 1000 MCG tablet Take 1,000 mcg by mouth daily with lunch.   Vitamin D3 (VITAMIN D) 25 MCG tablet Take 1,000 Units by mouth daily with lunch.   [DISCONTINUED] metoprolol succinate (TOPROL-XL) 100 MG 24 hr tablet TAKE 1 TABLET EVERY DAY WITH OR IMMEDIATELY AFTER A MEAL     Allergies:   Dulaglutide, Oxycodone, and Trelegy ellipta [fluticasone-umeclidin-vilant]   Social History   Socioeconomic History   Marital status: Married    Spouse name: Kyliee Ortego   Number of children: 3   Years of education: 12   Highest education level: Not on file  Occupational History   Occupation: retired  Tobacco Use   Smoking status: Former    Packs/day: 2.00    Years: 33.00    Total pack years: 66.00    Types: Cigarettes    Start date: 1966    Quit date: 05/04/1997    Years since quitting: 24.8   Smokeless tobacco: Never  Vaping Use   Vaping Use: Never used  Substance and Sexual Activity   Alcohol use: No   Drug use: No   Sexual activity: Not on file  Other Topics Concern   Not on file  Social History Narrative   Patient lives at home with her husband Jeneen Rinks .   Retired.   Education 12 th grade.   Right handed   Caffeine two cups of coffee.   Social Determinants of Health   Financial Resource Strain: Low Risk  (01/31/2021)   Overall Financial Resource Strain (CARDIA)    Difficulty of Paying Living Expenses: Not very hard  Food Insecurity: No Food Insecurity (01/20/2022)   Hunger Vital Sign    Worried About Running Out of Food in the Last Year: Never true    Ran Out of Food in the Last Year: Never true  Transportation Needs: No Transportation Needs (01/20/2022)   PRAPARE - Hydrologist (Medical): No    Lack of Transportation (Non-Medical): No  Physical Activity:  Insufficiently Active (09/30/2021)   Exercise Vital Sign    Days of Exercise per Week: 5 days    Minutes of Exercise per Session: 20 min  Stress: No Stress Concern Present (09/30/2021)   [place]    Feeling of Stress : Not at all  Social Connections: [place] (09/30/2021)   Social Connection and Isolation Panel [NHANES]    Frequency of Communication with Friends and Family: More than three times a week    Frequency of Social Gatherings with Friends and Family: Twice a week    Attends Religious Services: 1 to 4 times per year    Active Member of [employer] or Organizations: Yes    Attends Archivist Meetings: 1 to 4 times per year    Marital Status: Married     Family History: The patient's family history includes Diabetes in her maternal aunt and maternal uncle; Heart Problems in her father and mother; Heart defect in her mother; Heart disease in her brother; Lung cancer in her daughter; Throat cancer in her brother. There is no history of Colon cancer, Colon polyps, Esophageal cancer, Rectal cancer, or Stomach cancer.  ROS:  Please see the history of present illness.    (+) Tremors (+) Shortness of breath All other systems reviewed and are negative.  EKGs/Labs/Other Studies Reviewed:    The following studies were reviewed today:  DG Chest 02/20/2022: FINDINGS: The heart size and mediastinal contours are stable. Left subclavian pacemaker leads appear unchanged, projecting over the right atrium and right ventricle. Stable mild pleural thickening on the left. The lungs are clear. There is no pleural effusion or pneumothorax. Mild degenerative changes in the spine status post multilevel lower cervical fusion.   IMPRESSION: Stable chest. No acute cardiopulmonary process.  Echo 02/12/2022:  IMPRESSIONS   1. Left ventricular ejection fraction, by estimation, is 60 to 65%. The  left ventricle has  normal function. The left ventricle has no regional  wall motion abnormalities. There is moderate left ventricular hypertrophy.  Left ventricular diastolic  parameters are consistent with Grade II diastolic dysfunction  (pseudonormalization).   2. Right ventricular systolic function is normal. The right ventricular  size is normal. Tricuspid regurgitation signal is inadequate for assessing  PA pressure.   3. Left atrial size was mildly dilated.   4. The mitral valve is degenerative. Trivial mitral valve regurgitation.  No evidence of mitral stenosis.   5. The aortic valve is abnormal. There is moderate calcifcation of the  aortic valve and severe calcification of the annulus. Aortic valve  regurgitation is not visualized. Mild aortic valve stenosis. Aortic valve  area, by VTI measures 1.44 cm. Aortic  valve mean gradient measures 13.9 mmHg. Aortic valve Vmax measures 2.41  m/s.   6. The inferior vena cava is normal in size with greater than 50%  respiratory variability, suggesting right atrial pressure of 3 mmHg.   Right LE Venous Doppler 07/22/2021: Summary:  RIGHT:  - There is no evidence of deep vein thrombosis in the lower extremity.  However, portions of this examination were limited- see technologist  comments above.     - No cystic structure found in the popliteal fossa.     LEFT:  - No evidence of common femoral vein obstruction.   Bilateral Carotid Duplex 07/09/2021: Summary:  Right Carotid: Velocities in the right ICA are consistent with a 1-39%  stenosis.   Left Carotid: Velocities in the left ICA are consistent with a 1-39%  stenosis.   Vertebrals:  Bilateral vertebral arteries demonstrate antegrade flow.  Subclavians: Normal flow hemodynamics were seen in bilateral subclavian arteries.   TTE 10/03/20 IMPRESSIONS    1. Left ventricular ejection fraction, by estimation, is 60 to 65%. The  left ventricle has normal function. The left ventricle has no regional   wall motion abnormalities. There is severe concentric left ventricular  hypertrophy. Left ventricular diastolic   parameters are consistent with Grade II diastolic dysfunction  (pseudonormalization). The global longitudinal strain is normal (assessed  in the A4c view with no foreshortening).   2. Right ventricular systolic function is normal. The right ventricular  size is normal. There is normal pulmonary artery systolic pressure.   3. The mitral valve is grossly normal. No evidence of mitral valve  regurgitation.   4. The aortic valve is tricuspid. Aortic valve regurgitation is not  visualized. No aortic stenosis is present.   Comparison(s): A prior study was performed on 08/13/19. Prior images  reviewed side by side. LV is slightly less dynamic.   EKG:  EKG is personally reviewed. 03/09/2022: EKG was not ordered. 09/02/2021: EKG was not ordered. 07/07/2021: sinus rhythm 81 bpm first-degree AV block  PR interval 300 ms  Recent Labs: 03/20/2021: B Natriuretic Peptide 293.1 01/20/2022: ALT 24 01/25/2022: Hemoglobin 9.3; Platelets 398 02/16/2022: BUN 47; Creatinine, Ser 2.09; NT-Pro BNP 699; Potassium 4.1; Sodium 140   Recent Lipid Panel No results found for: "CHOL", "TRIG", "HDL", "CHOLHDL", "VLDL", "LDLCALC", "LDLDIRECT"   Risk Assessment/Calculations:    CHA2DS2-VASc Score = 7   This indicates a 11.2% annual risk of stroke. The patient's score is based upon: CHF History: 1 HTN History: 1 Diabetes History: 1 Stroke History: 0 Vascular Disease History: 1 Age Score: 2 Gender Score: 1              Physical Exam:    VS:  BP 132/70 (BP Location: Left Arm, Patient Position: Sitting, Cuff Size: Normal)   Pulse 74   Ht '5\' 5"'$  (1.651 m)   Wt 242 lb (109.8 kg)   SpO2 93%   BMI 40.27 kg/m     Wt Readings from Last 3 Encounters:  03/09/22 242 lb (109.8 kg)  02/20/22 242 lb 9.6 oz (110 kg)  01/29/22 250 lb (113.4 kg)     GEN:  Well nourished, well developed in no acute  distress HEENT: Normal, wearing oxygen NECK: No JVD; Bilat carotid bruits LYMPHATICS: No lymphadenopathy CARDIAC: RRR, no murmurs, no rubs, gallops RESPIRATORY:  Mild wheezing bilaterally; without rales or rhonchi  ABDOMEN: Soft, non-tender, non-distended MUSCULOSKELETAL: Chronic lower extremity edema; No deformity, chronic erythema improved from SKIN: Warm and dry NEUROLOGIC:  Alert and oriented x 3 PSYCHIATRIC:  Normal affect   ASSESSMENT:    1. Chronic diastolic heart failure (HCC)   2. Paroxysmal atrial fibrillation (HCC)      PLAN:     Chronic diastolic heart failure (HCC) No changes made in medication management.  We will continue with the torsemide 80 mg once a day.  Excellent.  Creatinine  stable.   Paroxysmal atrial fibrillation (HCC) Stable.  Doing well.  Pacemaker in place.  Continue with amiodarone 200 mg a day.  Rhythm control.  Continue with close monitoring medications.  No changes made.   Sinus node dysfunction ([place]) Pacemaker in place.  Dr. Caryl Comes.  Functioning well.  EP notes reviewed.  No changes made.   Amputation of the left toe  Last podiatry note reviewed.  Currently in boot.  Feeling much better.        Follow-up: 19-month  Medication Adjustments/Labs and Tests Ordered: Current medicines are reviewed at length with the patient today.  Concerns regarding medicines are outlined above.   No orders of the defined types were placed in this encounter.  Meds ordered this encounter  Medications   metoprolol succinate (TOPROL-XL) 100 MG 24 hr tablet    Sig: TAKE 1 TABLET EVERY DAY WITH OR IMMEDIATELY AFTER A MEAL    Dispense:  90 tablet    Refill:  3   Patient Instructions  Medication Instructions:  The current medical regimen is effective;  continue present plan and medications.  *If you need a refill on your cardiac medications before your next appointment, please call your pharmacy*  Follow-Up: At [facility] you and your health  needs are our priority.  As part of our continuing mission to provide you with exceptional heart care, we have created designated Provider Care Teams.  These Care Teams include your primary Cardiologist (physician) and Advanced Practice Providers (APPs -  Physician Assistants and Nurse Practitioners) who all work together to provide you with the care you need, when you need it.  We recommend signing up for the patient portal called "MyChart".  Sign up information is provided on this After Visit Summary.  MyChart is used to connect with patients for Virtual Visits (Telemedicine).  Patients are able to view lab/test results, encounter notes, upcoming appointments, etc.  Non-urgent messages can be sent to your provider as well.   To learn more about what you can do with MyChart, go to [email or web address].    Your next appointment:   6 month(s)  The format for your next appointment:   In Person  Provider:   Nicholes Rough, PA-C     Then, Candee Furbish, MD will plan to see you again in 1 year(s).    Important Information About Sugar         I,Danny Valdes,acting as a scribe for [employer], MD.,have documented all relevant documentation on the behalf of Candee Furbish, MD,as directed by  Candee Furbish, MD while in the presence of Candee Furbish, MD.  I, Candee Furbish, MD, have reviewed all documentation for this visit. The documentation on 03/09/22 for the exam, diagnosis, procedures, and orders are all accurate and complete.   Signed, Candee Furbish, MD  03/09/2022 12:36 PM    [place]

## 2022-03-23 DIAGNOSIS — N184 Chronic kidney disease, stage 4 (severe): Secondary | ICD-10-CM | POA: Diagnosis not present

## 2022-03-23 DIAGNOSIS — D631 Anemia in chronic kidney disease: Secondary | ICD-10-CM | POA: Diagnosis not present

## 2022-03-23 DIAGNOSIS — R809 Proteinuria, unspecified: Secondary | ICD-10-CM | POA: Diagnosis not present

## 2022-03-23 DIAGNOSIS — N2581 Secondary hyperparathyroidism of renal origin: Secondary | ICD-10-CM | POA: Diagnosis not present

## 2022-03-23 DIAGNOSIS — I129 Hypertensive chronic kidney disease with stage 1 through stage 4 chronic kidney disease, or unspecified chronic kidney disease: Secondary | ICD-10-CM | POA: Diagnosis not present

## 2022-03-23 DIAGNOSIS — N1832 Chronic kidney disease, stage 3b: Secondary | ICD-10-CM | POA: Diagnosis not present

## 2022-03-24 DIAGNOSIS — E113213 Type 2 diabetes mellitus with mild nonproliferative diabetic retinopathy with macular edema, bilateral: Secondary | ICD-10-CM | POA: Diagnosis not present

## 2022-03-25 ENCOUNTER — Ambulatory Visit (INDEPENDENT_AMBULATORY_CARE_PROVIDER_SITE_OTHER): Payer: Medicare HMO | Admitting: Podiatry

## 2022-03-25 DIAGNOSIS — Z89422 Acquired absence of other left toe(s): Secondary | ICD-10-CM | POA: Diagnosis not present

## 2022-03-25 NOTE — Progress Notes (Signed)
Subjective:  Patient ID: Kayla [name], female    DOB: [birth year],  MRN: [id number]  Chief Complaint  Patient presents with   Routine Post Op    DOS 01/22/22 left 4th toe hospital amputation, patient denies any pain, TX: surgical shoe     DOS: 01/22/2022 Procedure: Left fourth digit amputation  81 y.o. female returns for post-op check.  Patient states that she is doing well.  Minimal pain.  She denies any other acute complaints here to get her stitches out. Review of Systems: Negative except as noted in the HPI. Denies N/V/F/Ch.  Past Medical History:  Diagnosis Date   Allergy    Anxiety    Arthritis    CAD (coronary artery disease)    Mild per remote cath in 2006, /    Nuclear, January, 2013, no significant abnormality,   Carotid artery disease ([place])    Doppler, January, 2013, 0 03% RIC A., 55-97% LICA, stable   Cataract    CHF (congestive heart failure) ([place])    Chronic kidney disease    Complication of anesthesia    wakes up "mean"   COPD 02/16/2007   Intolerant of Spiriva, tudorza Intolerant of Trelegy    COPD (chronic obstructive pulmonary disease) ([place])    Coronary atherosclerosis 11/24/2008   Catheterization 2006, nonobstructive coronary disease   //   nuclear 2013, low risk     Diabetes mellitus, type 2 ([place])    Diverticula of colon    DIVERTICULOSIS OF COLON 03/23/2007   Qualifier: Diagnosis of  By: Halford Chessman MD, Vineet     Dizziness    occasional   Dizzy spells 05/04/2011   DM (diabetes mellitus) ([place]) 03/23/2007   Qualifier: Diagnosis of  By: Halford Chessman MD, Vineet     Dyspnea    On exertion   Ejection fraction    Essential hypertension 02/16/2007   Qualifier: Diagnosis of  By: Rosana Hoes CMA, Tammy     G E R D 03/23/2007   Qualifier: Diagnosis of  By: Halford Chessman MD, Vineet     Gall bladder disease 04/28/2016   Gastritis and gastroduodenitis    GERD (gastroesophageal reflux disease)    Goiter    hx of   Headache(784.0)    migraine   Hyperlipidemia    Hypertension     Hypothyroidism    HYPOTHYROIDISM, POSTSURGICAL 06/04/2008   Qualifier: Diagnosis of  By: Loanne Drilling MD, Sean A    Left ventricular hypertrophy    Leg edema    occasional swelling   Leg swelling 12/09/2018   Myofascial pain syndrome, cervical 01/06/2018   Obesity    OBESITY 11/24/2008   Qualifier: Diagnosis of  By: Sidney Ace     OBSTRUCTIVE SLEEP APNEA 02/16/2007   Auto CPAP 09/03/13 to 10/02/13 >> used on 30 of 30 nights with average 6 hrs and 3 min.  Average AHI is 3.1 with median CPAP 5 cm H2O and 95 th percentile CPAP 6 cm H2O.     Occipital neuralgia of right side 01/06/2018   OSA (obstructive sleep apnea)    cpap setting of 12   Pain in joint of right hip 01/21/2018   Palpitations 01/21/2011   48 hour Holter, 2015, scattered PACs and PVCs.    Paroxysmal atrial fibrillation (HCC)    Pneumonia    PONV (postoperative nausea and vomiting)    severe after every surgery   Pulmonary hypertension, secondary    RHINITIS 07/21/2010        RUQ abdominal pain  S/P left TKA 07/27/2011   Sinus node dysfunction (HCC) 11/08/2015   Sleep apnea    Spinal stenosis of cervical region 11/07/2014   Urinary frequency 12/09/2018   UTI (urinary tract infection) 08/15/2019   per pt report   VENTRICULAR HYPERTROPHY, LEFT 11/24/2008   Qualifier: Diagnosis of  By: Sidney Ace      Current Outpatient Medications:    acetaminophen (TYLENOL) 500 MG tablet, Take 1,000-1,500 mg by mouth every 8 (eight) hours as needed for mild pain, moderate pain or headache., Disp: , Rfl:    albuterol (PROVENTIL) (2.5 MG/3ML) 0.083% nebulizer solution, Take 3 mLs (2.5 mg total) by nebulization every 2 (two) hours as needed for wheezing or shortness of breath., Disp: 75 mL, Rfl: 5   albuterol (VENTOLIN HFA) 108 (90 Base) MCG/ACT inhaler, Inhale 2 puffs into the lungs every 6 (six) hours as needed for wheezing or shortness of breath., Disp: 3 each, Rfl: 3   Alcohol Swabs (B-D SINGLE USE SWABS REGULAR) PADS, , Disp:  , Rfl:    allopurinol (ZYLOPRIM) 100 MG tablet, Take 100 mg by mouth 2 (two) times daily., Disp: , Rfl:    amiodarone (PACERONE) 200 MG tablet, Take 1 tablet (200 mg total) by mouth daily., Disp: 90 tablet, Rfl: 3   amLODipine (NORVASC) 10 MG tablet, Take 1 tablet (10 mg total) by mouth daily., Disp: 30 tablet, Rfl: 3   apixaban (ELIQUIS) 2.5 MG TABS tablet, Take 1 tablet (2.5 mg total) by mouth 2 (two) times daily., Disp: 60 tablet, Rfl: 1   ascorbic acid (VITAMIN C) 500 MG tablet, Take 500 mg by mouth daily with lunch., Disp: , Rfl:    atorvastatin (LIPITOR) 40 MG tablet, Take 40 mg by mouth daily., Disp: , Rfl:    Blood Glucose Calibration (TRUE METRIX LEVEL 1) Low SOLN, , Disp: , Rfl:    BREZTRI AEROSPHERE 160-9-4.8 MCG/ACT AERO, INHALE 2 PUFFS INTO THE LUNGS IN THE MORNING AND AT BEDTIME., Disp: 3 each, Rfl: 10   Carboxymethylcellulose Sodium (ARTIFICIAL TEARS OP), Place 1 drop into both eyes in the morning and at bedtime., Disp: , Rfl:    collagenase (SANTYL) 250 UNIT/GM ointment, Apply 1 Application topically daily., Disp: 15 g, Rfl: 0   ferrous sulfate 325 (65 FE) MG tablet, Take 1 tablet (325 mg total) by mouth daily with breakfast., Disp: 90 tablet, Rfl: 3   fluticasone (FLONASE) 50 MCG/ACT nasal spray, USE 2 SPRAYS NASALLY DAILY, Disp: 48 g, Rfl: 1   gabapentin (NEURONTIN) 800 MG tablet, Take 800 mg by mouth See admin instructions. Take 800 mg by mouth three to four times a day, Disp: , Rfl:    HYDROcodone-acetaminophen (NORCO) 10-325 MG tablet, Take 1 tablet by mouth See admin instructions. Take 1 tablet by mouth three to four times a day, Disp: , Rfl:    insulin aspart (NOVOLOG) 100 UNIT/ML FlexPen, Inject 10-17 Units into the skin See admin instructions. Inject 10-17 units into the skin three times a day with meals "as needed," per SLIDING SCALE, Disp: , Rfl:    insulin glargine (LANTUS) 100 UNIT/ML injection, Inject 0.55 mLs (55 Units total) into the skin 2 (two) times daily. (Patient  taking differently: Inject 50-90 Units into the skin 2 (two) times daily after a meal.), Disp: 10 mL, Rfl: 11   levothyroxine (SYNTHROID) 200 MCG tablet, Take 200 mcg by mouth daily before breakfast., Disp: , Rfl:    losartan (COZAAR) 25 MG tablet, Take 25 mg by mouth daily., Disp: ,  Rfl:    metolazone (ZAROXOLYN) 5 MG tablet, Take one tablet by mouth daily for 3 days 30 minutes before taking your torsemide., Disp: 3 tablet, Rfl: 0   metoprolol succinate (TOPROL-XL) 100 MG 24 hr tablet, TAKE 1 TABLET EVERY DAY WITH OR IMMEDIATELY AFTER A MEAL, Disp: 90 tablet, Rfl: 3   naloxone (NARCAN) nasal spray 4 mg/0.1 mL, Place 4 mg into the nose once as needed for opioid reversal., Disp: , Rfl:    nitroGLYCERIN (NITROSTAT) 0.4 MG SL tablet, Place 1 tablet (0.4 mg total) under the tongue every 5 (five) minutes as needed for chest pain., Disp: 25 tablet, Rfl: 4   PRESCRIPTION MEDICATION, CPAP- At bedtime and as needed during any time of rest, Disp: , Rfl:    torsemide (DEMADEX) 20 MG tablet, Take 5 tablets (100 mg total) by mouth daily., Disp: 135 tablet, Rfl: 3   TRUE METRIX BLOOD GLUCOSE TEST test strip, , Disp: , Rfl:    TRUEplus Lancets 28G MISC, , Disp: , Rfl:    vitamin B-12 (CYANOCOBALAMIN) 1000 MCG tablet, Take 1,000 mcg by mouth daily with lunch., Disp: , Rfl:    Vitamin D3 (VITAMIN D) 25 MCG tablet, Take 1,000 Units by mouth daily with lunch., Disp: , Rfl:   Social History   Tobacco Use  Smoking Status Former   Packs/day: 2.00   Years: 33.00   Total pack years: 66.00   Types: Cigarettes   Start date: 1966   Quit date: 05/04/1997   Years since quitting: 24.9  Smokeless Tobacco Never    Allergies  Allergen Reactions   Dulaglutide Nausea Only, Other (See Comments) and Cough    TRULICITY made the patient sick to her stomach (only) several days after using it   Oxycodone Itching and Other (See Comments)    Pt still takes this med even thought it causes itching   Trelegy Ellipta  [Fluticasone-Umeclidin-Vilant] Nausea And Vomiting, Other (See Comments) and Cough    Made the patient sick to her stomach (only) several days after using it    Objective:  There were no vitals filed for this visit. There is no height or weight on file to calculate BMI. Constitutional Well developed. Well nourished.  Vascular Foot warm and well perfused. Capillary refill normal to all digits.   Neurologic Normal speech. Oriented to person, place, and time. Epicritic sensation to light touch grossly present bilaterally.  Dermatologic No further dehiscence noted.  Skin completely epithelialized.  No signs of infection noted.  Orthopedic: No further tenderness to palpation noted about the surgical site.   Radiographs: None Assessment:   No diagnosis found.    Plan:  Patient was evaluated and treated and all questions answered.  S/p foot surgery left -Clinically healed.  She has completed utilizing amputation site.  At this time patient is officially discharged from my care.  I discussed shoe gear modification and prevention techniques she states understanding if any foot and ankle issues on future she will come back and see me.  No follow-ups on file.

## 2022-04-02 DIAGNOSIS — M353 Polymyalgia rheumatica: Secondary | ICD-10-CM | POA: Diagnosis not present

## 2022-04-02 DIAGNOSIS — E349 Endocrine disorder, unspecified: Secondary | ICD-10-CM | POA: Diagnosis not present

## 2022-04-02 DIAGNOSIS — M25569 Pain in unspecified knee: Secondary | ICD-10-CM | POA: Diagnosis not present

## 2022-04-02 DIAGNOSIS — G894 Chronic pain syndrome: Secondary | ICD-10-CM | POA: Diagnosis not present

## 2022-04-02 DIAGNOSIS — M549 Dorsalgia, unspecified: Secondary | ICD-10-CM | POA: Diagnosis not present

## 2022-04-02 DIAGNOSIS — Z1389 Encounter for screening for other disorder: Secondary | ICD-10-CM | POA: Diagnosis not present

## 2022-04-02 DIAGNOSIS — M1710 Unilateral primary osteoarthritis, unspecified knee: Secondary | ICD-10-CM | POA: Diagnosis not present

## 2022-04-02 DIAGNOSIS — Z79891 Long term (current) use of opiate analgesic: Secondary | ICD-10-CM | POA: Diagnosis not present

## 2022-04-02 DIAGNOSIS — M8580 Other specified disorders of bone density and structure, unspecified site: Secondary | ICD-10-CM | POA: Diagnosis not present

## 2022-04-03 ENCOUNTER — Ambulatory Visit (INDEPENDENT_AMBULATORY_CARE_PROVIDER_SITE_OTHER): Payer: Medicare HMO | Admitting: Adult Health

## 2022-04-03 ENCOUNTER — Encounter: Payer: Self-pay | Admitting: Adult Health

## 2022-04-03 ENCOUNTER — Telehealth: Payer: Self-pay | Admitting: *Deleted

## 2022-04-03 VITALS — BP 130/70 | HR 70 | Temp 98.2°F | Ht 65.0 in | Wt 235.8 lb

## 2022-04-03 DIAGNOSIS — I5032 Chronic diastolic (congestive) heart failure: Secondary | ICD-10-CM

## 2022-04-03 DIAGNOSIS — J449 Chronic obstructive pulmonary disease, unspecified: Secondary | ICD-10-CM | POA: Diagnosis not present

## 2022-04-03 DIAGNOSIS — J9611 Chronic respiratory failure with hypoxia: Secondary | ICD-10-CM

## 2022-04-03 MED ORDER — ALBUTEROL SULFATE (2.5 MG/3ML) 0.083% IN NEBU: 2.5000 mg | INHALATION_SOLUTION | RESPIRATORY_TRACT | 5 refills | Status: DC | PRN

## 2022-04-03 NOTE — Assessment & Plan Note (Signed)
Continue on O2 to keep sats >88-90%

## 2022-04-03 NOTE — Patient Instructions (Addendum)
Mucinex DM Twice daily  As needed  cough/congestion  Continue on BREZTRI 2 puffs Twice daily, rinse after use  Albuterol inhaler /Neb As needed   Flutter valve Twice daily   Continue follow up with Cardiology and Nephrology  Continue on Oxygen 3l/m .  Continue on CPAP At bedtime   Check with Dr. Lisbeth Ply when last Pneumonia vaccine was ?  Covid booster this month as discussed  Follow up with Dr. Halford Chessman or Jadarius Commons NP in 3 months and As needed .  Please contact office for sooner follow up if symptoms do not improve or worsen or seek emergency care

## 2022-04-03 NOTE — Progress Notes (Signed)
Reviewed and agree with assessment/plan.   Chesley Mires, MD [facility]monary/Critical Care 04/03/2022, 10:09 AM Pager:  [phone]

## 2022-04-03 NOTE — Telephone Encounter (Signed)
That's okay

## 2022-04-03 NOTE — Progress Notes (Signed)
$'@Patient'u$  ID: Kayla [name], female    DOB: [birth year], 81 y.o.   MRN: [id number]  Chief Complaint  Patient presents with   Follow-up    Referring provider: [name], MD  HPI: 81 year old female former smoker followed for COPD, chronic respiratory failure and obstructive sleep apnea Medical history significant for insulin-dependent diabetes, A-fib on Eliquis and amiodarone, chronic anemia and diastolic heart failure and chronic kidney disease  TEST/EVENTS :  Spirometry 10/19/08 >> FEV1 1.55 (68%), FEV1% 70  Spirometry 08/29/12 >> FEV1 1.38 (59%), FEV1% 58 FeNO 03/09/16 >> 7   Chest Imaging:  HRCT chest 11/16/20 >> atherosclerosis, scattered areas of scarring, several tiny nodules up to 2 mm   Sleep Tests:  PSG 02/2005 >> AHI 10  CPAP titration 02/19/14 >> CPAP 11 cm H2O >> AHI 0.7, PLMI 81.4, did not need supplemental oxygen Auto CPAP 12/13/21 to 01/11/22 >> used on 29 of 30 nights with average 6 hrs 35 min.  Average AHI 2 with median CPAP 7 and 95 th percentile CPAP 11 cm H2O   Cardiac Tests:  Echo 10/03/20 >> EF 60 to 65%, severe LVH, grade 2 DD  04/03/2022 Follow up: COPD and OSA  Patient returns for a 6-week follow-up.  Patient has underlying COPD with chronic bronchitis.  Has been intolerant to Trelegy, Incruse, Spiriva.  Currently on Breztri inhaler twice daily. Feels breathing is doing okay  Mucus gets thick at times . No fever or discolored mucus . She is very sedentary , has balance issues and prone to falls. Is not able to do much activity. Gets winded easily .   Patient has underlying CPAP is on CPAP at bedtime.  She wears it every night.  Says she cannot sleep without it.  Feels that she benefits from CPAP with decreased daytime sleepiness.  CPAP download shows 100% compliance.  Daily average usage at 7 hours.  Patient is on auto CPAP 5 to 15 cm H2O.  AHI is 2.7/hour.  Patient has chronic diastolic heart failure.  Complicated by chronic kidney disease.  2D echo  shows normal EF and grade 2 diastolic dysfunction.  She remains on aggressive diuresis by cardiology.  She says overall swelling has been some better.   She remains on oxygen 3 L.  No increased oxygen demand.     Allergies  Allergen Reactions   Dulaglutide Nausea Only, Other (See Comments) and Cough    TRULICITY made the patient sick to her stomach (only) several days after using it   Oxycodone Itching and Other (See Comments)    Pt still takes this med even thought it causes itching   Trelegy Ellipta [Fluticasone-Umeclidin-Vilant] Nausea And Vomiting, Other (See Comments) and Cough    Made the patient sick to her stomach (only) several days after using it     Immunization History  Administered Date(s) Administered   Fluad Quad(high Dose 65+) 01/04/2019, 01/30/2022   Influenza Split 03/05/2011, 01/08/2017, 02/10/2018   Influenza Whole 03/18/2009, 02/03/2010, 04/03/2012   Influenza, High Dose Seasonal PF 02/04/2016, 02/23/2017, 01/03/2020   Influenza,inj,Quad PF,6+ Mos 02/10/2013, 03/04/2014, 03/17/2015   Influenza,inj,quad, With Preservative 01/20/2018   Moderna Sars-Covid-2 Vaccination 05/17/2019, 06/13/2019, 03/05/2020   Pneumococcal Polysaccharide-23 02/03/2010   Tdap 03/04/2014   Zoster, Live 10/02/2013    Past Medical History:  Diagnosis Date   Allergy    Anxiety    Arthritis    CAD (coronary artery disease)    Mild per remote cath in 2006, /    Nuclear,  January, 2013, no significant abnormality,   Carotid artery disease ([place])    Doppler, January, 2013, 0 02% RIC A., 77-41% LICA, stable   Cataract    CHF (congestive heart failure) (HCC)    Chronic kidney disease    Complication of anesthesia    wakes up "mean"   COPD 02/16/2007   Intolerant of Spiriva, tudorza Intolerant of Trelegy    COPD (chronic obstructive pulmonary disease) ([place])    Coronary atherosclerosis 11/24/2008   Catheterization 2006, nonobstructive coronary disease   //   nuclear 2013, low risk      Diabetes mellitus, type 2 ([place])    Diverticula of colon    DIVERTICULOSIS OF COLON 03/23/2007   Qualifier: Diagnosis of  By: Halford Chessman MD, Vineet     Dizziness    occasional   Dizzy spells 05/04/2011   DM (diabetes mellitus) ([place]) 03/23/2007   Qualifier: Diagnosis of  By: Halford Chessman MD, Vineet     Dyspnea    On exertion   Ejection fraction    Essential hypertension 02/16/2007   Qualifier: Diagnosis of  By: Rosana Hoes CMA, Amar Sippel     G E R D 03/23/2007   Qualifier: Diagnosis of  By: Halford Chessman MD, Vineet     Gall bladder disease 04/28/2016   Gastritis and gastroduodenitis    GERD (gastroesophageal reflux disease)    Goiter    hx of   Headache(784.0)    migraine   Hyperlipidemia    Hypertension    Hypothyroidism    HYPOTHYROIDISM, POSTSURGICAL 06/04/2008   Qualifier: Diagnosis of  By: Loanne Drilling MD, Sean A    Left ventricular hypertrophy    Leg edema    occasional swelling   Leg swelling 12/09/2018   Myofascial pain syndrome, cervical 01/06/2018   Obesity    OBESITY 11/24/2008   Qualifier: Diagnosis of  By: Sidney Ace     OBSTRUCTIVE SLEEP APNEA 02/16/2007   Auto CPAP 09/03/13 to 10/02/13 >> used on 30 of 30 nights with average 6 hrs and 3 min.  Average AHI is 3.1 with median CPAP 5 cm H2O and 95 th percentile CPAP 6 cm H2O.     Occipital neuralgia of right side 01/06/2018   OSA (obstructive sleep apnea)    cpap setting of 12   Pain in joint of right hip 01/21/2018   Palpitations 01/21/2011   48 hour Holter, 2015, scattered PACs and PVCs.    Paroxysmal atrial fibrillation (HCC)    Pneumonia    PONV (postoperative nausea and vomiting)    severe after every surgery   Pulmonary hypertension, secondary    RHINITIS 07/21/2010        RUQ abdominal pain    S/P left TKA 07/27/2011   Sinus node dysfunction (HCC) 11/08/2015   Sleep apnea    Spinal stenosis of cervical region 11/07/2014   Urinary frequency 12/09/2018   UTI (urinary tract infection) 08/15/2019   per pt report   VENTRICULAR  HYPERTROPHY, LEFT 11/24/2008   Qualifier: Diagnosis of  By: Sidney Ace      Tobacco History: Social History   Tobacco Use  Smoking Status Former   Packs/day: 2.00   Years: 33.00   Total pack years: 66.00   Types: Cigarettes   Start date: 1966   Quit date: 05/04/1997   Years since quitting: 24.9  Smokeless Tobacco Never   Counseling given: Not Answered   Outpatient Medications Prior to Visit  Medication Sig Dispense Refill   acetaminophen (TYLENOL) 500 MG tablet Take  1,000-1,500 mg by mouth every 8 (eight) hours as needed for mild pain, moderate pain or headache.     albuterol (PROVENTIL) (2.5 MG/3ML) 0.083% nebulizer solution Take 3 mLs (2.5 mg total) by nebulization every 2 (two) hours as needed for wheezing or shortness of breath. 75 mL 5   albuterol (VENTOLIN HFA) 108 (90 Base) MCG/ACT inhaler Inhale 2 puffs into the lungs every 6 (six) hours as needed for wheezing or shortness of breath. 3 each 3   Alcohol Swabs (B-D SINGLE USE SWABS REGULAR) PADS      allopurinol (ZYLOPRIM) 100 MG tablet Take 100 mg by mouth 2 (two) times daily.     amiodarone (PACERONE) 200 MG tablet Take 1 tablet (200 mg total) by mouth daily. 90 tablet 3   amLODipine (NORVASC) 10 MG tablet Take 1 tablet (10 mg total) by mouth daily. 30 tablet 3   apixaban (ELIQUIS) 2.5 MG TABS tablet Take 1 tablet (2.5 mg total) by mouth 2 (two) times daily. 60 tablet 1   ascorbic acid (VITAMIN C) 500 MG tablet Take 500 mg by mouth daily with lunch.     atorvastatin (LIPITOR) 40 MG tablet Take 40 mg by mouth daily.     Blood Glucose Calibration (TRUE METRIX LEVEL 1) Low SOLN      BREZTRI AEROSPHERE 160-9-4.8 MCG/ACT AERO INHALE 2 PUFFS INTO THE LUNGS IN THE MORNING AND AT BEDTIME. 3 each 10   Carboxymethylcellulose Sodium (ARTIFICIAL TEARS OP) Place 1 drop into both eyes in the morning and at bedtime.     chlorthalidone (HYGROTON) 25 MG tablet Take 25 mg by mouth daily.     collagenase (SANTYL) 250 UNIT/GM ointment  Apply 1 Application topically daily. 15 g 0   ferrous sulfate 325 (65 FE) MG tablet Take 1 tablet (325 mg total) by mouth daily with breakfast. 90 tablet 3   fluticasone (FLONASE) 50 MCG/ACT nasal spray USE 2 SPRAYS NASALLY DAILY 48 g 1   gabapentin (NEURONTIN) 800 MG tablet Take 800 mg by mouth See admin instructions. Take 800 mg by mouth three to four times a day     HYDROcodone-acetaminophen (NORCO) 10-325 MG tablet Take 1 tablet by mouth See admin instructions. Take 1 tablet by mouth three to four times a day     insulin aspart (NOVOLOG) 100 UNIT/ML FlexPen Inject 10-17 Units into the skin See admin instructions. Inject 10-17 units into the skin three times a day with meals "as needed," per SLIDING SCALE     insulin glargine (LANTUS) 100 UNIT/ML injection Inject 0.55 mLs (55 Units total) into the skin 2 (two) times daily. (Patient taking differently: Inject 50-90 Units into the skin 2 (two) times daily after a meal.) 10 mL 11   levothyroxine (SYNTHROID) 200 MCG tablet Take 200 mcg by mouth daily before breakfast.     losartan (COZAAR) 25 MG tablet Take 25 mg by mouth daily.     metolazone (ZAROXOLYN) 5 MG tablet Take one tablet by mouth daily for 3 days 30 minutes before taking your torsemide. 3 tablet 0   metoprolol succinate (TOPROL-XL) 100 MG 24 hr tablet TAKE 1 TABLET EVERY DAY WITH OR IMMEDIATELY AFTER A MEAL 90 tablet 3   naloxone (NARCAN) nasal spray 4 mg/0.1 mL Place 4 mg into the nose once as needed for opioid reversal.     nitroGLYCERIN (NITROSTAT) 0.4 MG SL tablet Place 1 tablet (0.4 mg total) under the tongue every 5 (five) minutes as needed for chest pain. 25 tablet 4  PRESCRIPTION MEDICATION CPAP- At bedtime and as needed during any time of rest     torsemide (DEMADEX) 20 MG tablet Take 5 tablets (100 mg total) by mouth daily. 135 tablet 3   TRUE METRIX BLOOD GLUCOSE TEST test strip      TRUEplus Lancets 28G MISC      vitamin B-12 (CYANOCOBALAMIN) 1000 MCG tablet Take 1,000 mcg by  mouth daily with lunch.     Vitamin D3 (VITAMIN D) 25 MCG tablet Take 1,000 Units by mouth daily with lunch.     No facility-administered medications prior to visit.     Review of Systems:   Constitutional:   No  weight loss, night sweats,  Fevers, chills, +fatigue, or  lassitude.  HEENT:   No headaches,  Difficulty swallowing,  Tooth/dental problems, or  Sore throat,                No sneezing, itching, ear ache, nasal congestion, post nasal drip,   CV:  No chest pain,  Orthopnea, PND, +swelling in lower extremities, No anasarca, dizziness, palpitations, syncope.   GI  No heartburn, indigestion, abdominal pain, nausea, vomiting, diarrhea, change in bowel habits, loss of appetite, bloody stools.   Resp:   No chest wall deformity  Skin: no rash or lesions.  GU: no dysuria, change in color of urine, no urgency or frequency.  No flank pain, no hematuria   MS:  No joint pain or swelling.  No decreased range of motion.  No back pain.    Physical Exam  BP 130/70 (BP Location: Right Arm, Patient Position: Sitting, Cuff Size: Large)   Pulse 70   Temp 98.2 F (36.8 C) (Oral)   Ht '5\' 5"'$  (1.651 m)   Wt 235 lb 12.8 oz (107 kg)   SpO2 95%   BMI 39.24 kg/m   GEN: A/Ox3; pleasant , NAD, well nourished  , on o2 in WC    HEENT:  [place]/AT,   NOSE-clear, THROAT-clear, no lesions, no postnasal drip or exudate noted.   NECK:  Supple w/ fair ROM; no JVD; normal carotid impulses w/o bruits; no thyromegaly or nodules palpated; no lymphadenopathy.    RESP  Clear  P & A; w/o, wheezes/ rales/ or rhonchi. no accessory muscle use, no dullness to percussion  CARD:  RRR, no m/r/g, 1+ peripheral edema, pulses intact, no cyanosis or clubbing.  GI:   Soft & nt; nml bowel sounds; no organomegaly or masses detected.   Musco: Warm bil, no deformities or joint swelling noted.   Neuro: alert, no focal deficits noted.    Skin: Warm, no lesions or rashes    Lab  Results:  CBC   BMET   BNP    Imaging: No results found.        No data to display          Lab Results  Component Value Date   NITRICOXIDE 7 03/09/2016        Assessment & Plan:   COPD (chronic obstructive pulmonary disease) ([place]) Compensated on present regimen  Encouraged to use Flutter valve   Plan  Patient Instructions  Mucinex DM Twice daily  As needed  cough/congestion  Continue on BREZTRI 2 puffs Twice daily, rinse after use  Albuterol inhaler /Neb As needed   Flutter valve Twice daily   Continue follow up with Cardiology and Nephrology  Continue on Oxygen 3l/m .  Continue on CPAP At bedtime   Check with Dr. Lisbeth Ply when last Pneumonia  vaccine was ?  Covid booster this month as discussed  Follow up with Dr. Halford Chessman or Clell Trahan NP in 3 months and As needed .  Please contact office for sooner follow up if symptoms do not improve or worsen or seek emergency care     Chronic respiratory failure with hypoxia ([place]) Continue on O2 to keep sats >24-46%  Chronic diastolic heart failure ([place]) Appears compensated on present regimen   Plan  Patient Instructions  Mucinex DM Twice daily  As needed  cough/congestion  Continue on BREZTRI 2 puffs Twice daily, rinse after use  Albuterol inhaler /Neb As needed   Flutter valve Twice daily   Continue follow up with Cardiology and Nephrology  Continue on Oxygen 3l/m .  Continue on CPAP At bedtime   Check with Dr. Lisbeth Ply when last Pneumonia vaccine was ?  Covid booster this month as discussed  Follow up with Dr. Halford Chessman or Maveryk Renstrom NP in 3 months and As needed .  Please contact office for sooner follow up if symptoms do not improve or worsen or seek emergency care       Rexene Edison, NP 04/03/2022

## 2022-04-03 NOTE — Assessment & Plan Note (Signed)
Compensated on present regimen  Encouraged to use Flutter valve   Plan  Patient Instructions  Mucinex DM Twice daily  As needed  cough/congestion  Continue on BREZTRI 2 puffs Twice daily, rinse after use  Albuterol inhaler /Neb As needed   Flutter valve Twice daily   Continue follow up with Cardiology and Nephrology  Continue on Oxygen 3l/m .  Continue on CPAP At bedtime   Check with Dr. Lisbeth Ply when last Pneumonia vaccine was ?  Covid booster this month as discussed  Follow up with Dr. Halford Chessman or Zainab Crumrine NP in 3 months and As needed .  Please contact office for sooner follow up if symptoms do not improve or worsen or seek emergency care

## 2022-04-03 NOTE — Addendum Note (Signed)
Addended by: Vanessa Barbara on: 04/03/2022 09:23 AM   Modules accepted: Orders

## 2022-04-03 NOTE — Assessment & Plan Note (Signed)
Appears compensated on present regimen   Plan  Patient Instructions  Mucinex DM Twice daily  As needed  cough/congestion  Continue on BREZTRI 2 puffs Twice daily, rinse after use  Albuterol inhaler /Neb As needed   Flutter valve Twice daily   Continue follow up with Cardiology and Nephrology  Continue on Oxygen 3l/m .  Continue on CPAP At bedtime   Check with Dr. Lisbeth Ply when last Pneumonia vaccine was ?  Covid booster this month as discussed  Follow up with Dr. Halford Chessman or Natalija Mavis NP in 3 months and As needed .  Please contact office for sooner follow up if symptoms do not improve or worsen or seek emergency care

## 2022-04-03 NOTE — Telephone Encounter (Signed)
Dr. Halford Chessman, please advise if ok to switch to Dr. Ander Slade as patient would like to continue to be seen at the [facility] location and not transfer to [facility] location.  Thank you.

## 2022-04-06 DIAGNOSIS — M353 Polymyalgia rheumatica: Secondary | ICD-10-CM | POA: Diagnosis not present

## 2022-04-06 DIAGNOSIS — M47816 Spondylosis without myelopathy or radiculopathy, lumbar region: Secondary | ICD-10-CM | POA: Diagnosis not present

## 2022-04-06 DIAGNOSIS — M545 Low back pain, unspecified: Secondary | ICD-10-CM | POA: Diagnosis not present

## 2022-04-07 NOTE — Telephone Encounter (Signed)
OK to switch to [facility] since this is due to location change  I called the pt to set up 3 mo ov with [place] and there was no answer- LMTCB Will forward to front pool desk for further fu, thanks!

## 2022-04-13 NOTE — Telephone Encounter (Signed)
Okay with me 

## 2022-04-20 ENCOUNTER — Other Ambulatory Visit: Payer: Self-pay | Admitting: Cardiology

## 2022-04-20 ENCOUNTER — Ambulatory Visit (INDEPENDENT_AMBULATORY_CARE_PROVIDER_SITE_OTHER): Payer: Medicare HMO

## 2022-04-20 DIAGNOSIS — I495 Sick sinus syndrome: Secondary | ICD-10-CM

## 2022-04-20 DIAGNOSIS — I480 Paroxysmal atrial fibrillation: Secondary | ICD-10-CM

## 2022-04-20 NOTE — Telephone Encounter (Signed)
Prescription refill request for Eliquis received. Indication:afib Last office visit:11/23 Scr:2.0 Age: 81 Weight:107 kg  Prescription refilled

## 2022-04-21 LAB — CUP PACEART REMOTE DEVICE CHECK
Battery Remaining Longevity: 40 mo
Battery Voltage: 2.97 V
Brady Statistic AP VP Percent: 3.45 %
Brady Statistic AP VS Percent: 92.62 %
Brady Statistic AS VP Percent: 0.42 %
Brady Statistic AS VS Percent: 3.52 %
Brady Statistic RA Percent Paced: 95.44 %
Brady Statistic RV Percent Paced: 4.21 %
Date Time Interrogation Session: 20231219120254
Implantable Lead Connection Status: 753985
Implantable Lead Connection Status: 753985
Implantable Lead Implant Date: 20170707
Implantable Lead Implant Date: 20170707
Implantable Lead Location: 753859
Implantable Lead Location: 753860
Implantable Lead Model: 3830
Implantable Lead Model: 5076
Implantable Pulse Generator Implant Date: 20170707
Lead Channel Impedance Value: 285 Ohm
Lead Channel Impedance Value: 380 Ohm
Lead Channel Impedance Value: 380 Ohm
Lead Channel Impedance Value: 418 Ohm
Lead Channel Pacing Threshold Amplitude: 1 V
Lead Channel Pacing Threshold Amplitude: 1.875 V
Lead Channel Pacing Threshold Pulse Width: 0.4 ms
Lead Channel Pacing Threshold Pulse Width: 0.4 ms
Lead Channel Sensing Intrinsic Amplitude: 3 mV
Lead Channel Sensing Intrinsic Amplitude: 3 mV
Lead Channel Sensing Intrinsic Amplitude: 6.625 mV
Lead Channel Sensing Intrinsic Amplitude: 6.625 mV
Lead Channel Setting Pacing Amplitude: 2 V
Lead Channel Setting Pacing Amplitude: 2.5 V
Lead Channel Setting Pacing Pulse Width: 1 ms
Lead Channel Setting Sensing Sensitivity: 2.8 mV
Zone Setting Status: 755011
Zone Setting Status: 755011

## 2022-05-05 ENCOUNTER — Telehealth: Payer: Self-pay | Admitting: Internal Medicine

## 2022-05-05 NOTE — Telephone Encounter (Signed)
  Patient wants to get a Life Alert and needs to know if she can wear the necklace with her pacemaker. Please advise.

## 2022-05-05 NOTE — Telephone Encounter (Signed)
Returned call to Pt.  Advised ok to have a lifealert necklace.

## 2022-05-07 DIAGNOSIS — M8580 Other specified disorders of bone density and structure, unspecified site: Secondary | ICD-10-CM | POA: Diagnosis not present

## 2022-05-07 DIAGNOSIS — M353 Polymyalgia rheumatica: Secondary | ICD-10-CM | POA: Diagnosis not present

## 2022-05-07 DIAGNOSIS — E349 Endocrine disorder, unspecified: Secondary | ICD-10-CM | POA: Diagnosis not present

## 2022-05-07 DIAGNOSIS — Z79891 Long term (current) use of opiate analgesic: Secondary | ICD-10-CM | POA: Diagnosis not present

## 2022-05-07 DIAGNOSIS — M1710 Unilateral primary osteoarthritis, unspecified knee: Secondary | ICD-10-CM | POA: Diagnosis not present

## 2022-05-07 DIAGNOSIS — M549 Dorsalgia, unspecified: Secondary | ICD-10-CM | POA: Diagnosis not present

## 2022-05-07 DIAGNOSIS — Z1389 Encounter for screening for other disorder: Secondary | ICD-10-CM | POA: Diagnosis not present

## 2022-05-07 DIAGNOSIS — M25569 Pain in unspecified knee: Secondary | ICD-10-CM | POA: Diagnosis not present

## 2022-05-07 DIAGNOSIS — G894 Chronic pain syndrome: Secondary | ICD-10-CM | POA: Diagnosis not present

## 2022-05-14 ENCOUNTER — Ambulatory Visit: Payer: Medicare HMO | Admitting: Podiatry

## 2022-05-14 ENCOUNTER — Other Ambulatory Visit: Payer: Self-pay | Admitting: Cardiology

## 2022-05-14 DIAGNOSIS — L600 Ingrowing nail: Secondary | ICD-10-CM | POA: Diagnosis not present

## 2022-05-14 NOTE — Progress Notes (Signed)
Subjective:  Patient ID: Kayla [name], female    DOB: Aug 11, 1940,  MRN: [id number]  Chief Complaint  Patient presents with   Nail Problem    Right hallux nail discolored under the nail     82 y.o. female presents with the above complaint.  Patient presents with right hallux lateral border ingrown pain for touch is progressive gotten worse worse with ambulation worse with pressure she would like to have removed she has not seen and was prior to seeing me.  She is not diabetic.   Review of Systems: Negative except as noted in the HPI. Denies N/V/F/Ch.  Past Medical History:  Diagnosis Date   Allergy    Anxiety    Arthritis    CAD (coronary artery disease)    Mild per remote cath in 2006, /    Nuclear, January, 2013, no significant abnormality,   Carotid artery disease ([place])    Doppler, January, 2013, 0 71% RIC A., 06-26% LICA, stable   Cataract    CHF (congestive heart failure) ([place])    Chronic kidney disease    Complication of anesthesia    wakes up "mean"   COPD 02/16/2007   Intolerant of Spiriva, tudorza Intolerant of Trelegy    COPD (chronic obstructive pulmonary disease) ([place])    Coronary atherosclerosis 11/24/2008   Catheterization 2006, nonobstructive coronary disease   //   nuclear 2013, low risk     Diabetes mellitus, type 2 ([place])    Diverticula of colon    DIVERTICULOSIS OF COLON 03/23/2007   Qualifier: Diagnosis of  By: Halford Chessman MD, Vineet     Dizziness    occasional   Dizzy spells 05/04/2011   DM (diabetes mellitus) ([place]) 03/23/2007   Qualifier: Diagnosis of  By: Halford Chessman MD, Vineet     Dyspnea    On exertion   Ejection fraction    Essential hypertension 02/16/2007   Qualifier: Diagnosis of  By: Rosana Hoes CMA, Tammy     G E R D 03/23/2007   Qualifier: Diagnosis of  By: Halford Chessman MD, Vineet     Gall bladder disease 04/28/2016   Gastritis and gastroduodenitis    GERD (gastroesophageal reflux disease)    Goiter    hx of   Headache(784.0)    migraine    Hyperlipidemia    Hypertension    Hypothyroidism    HYPOTHYROIDISM, POSTSURGICAL 06/04/2008   Qualifier: Diagnosis of  By: Loanne Drilling MD, Sean A    Left ventricular hypertrophy    Leg edema    occasional swelling   Leg swelling 12/09/2018   Myofascial pain syndrome, cervical 01/06/2018   Obesity    OBESITY 11/24/2008   Qualifier: Diagnosis of  By: Sidney Ace     OBSTRUCTIVE SLEEP APNEA 02/16/2007   Auto CPAP 09/03/13 to 10/02/13 >> used on 30 of 30 nights with average 6 hrs and 3 min.  Average AHI is 3.1 with median CPAP 5 cm H2O and 95 th percentile CPAP 6 cm H2O.     Occipital neuralgia of right side 01/06/2018   OSA (obstructive sleep apnea)    cpap setting of 12   Pain in joint of right hip 01/21/2018   Palpitations 01/21/2011   48 hour Holter, 2015, scattered PACs and PVCs.    Paroxysmal atrial fibrillation (HCC)    Pneumonia    PONV (postoperative nausea and vomiting)    severe after every surgery   Pulmonary hypertension, secondary    RHINITIS 07/21/2010  RUQ abdominal pain    S/P left TKA 07/27/2011   Sinus node dysfunction (HCC) 11/08/2015   Sleep apnea    Spinal stenosis of cervical region 11/07/2014   Urinary frequency 12/09/2018   UTI (urinary tract infection) 08/15/2019   per pt report   VENTRICULAR HYPERTROPHY, LEFT 11/24/2008   Qualifier: Diagnosis of  By: Sidney Ace      Current Outpatient Medications:    acetaminophen (TYLENOL) 500 MG tablet, Take 1,000-1,500 mg by mouth every 8 (eight) hours as needed for mild pain, moderate pain or headache., Disp: , Rfl:    albuterol (PROVENTIL) (2.5 MG/3ML) 0.083% nebulizer solution, Take 3 mLs (2.5 mg total) by nebulization every 2 (two) hours as needed for wheezing or shortness of breath., Disp: 75 mL, Rfl: 5   albuterol (VENTOLIN HFA) 108 (90 Base) MCG/ACT inhaler, Inhale 2 puffs into the lungs every 6 (six) hours as needed for wheezing or shortness of breath., Disp: 3 each, Rfl: 3   Alcohol Swabs (B-D  SINGLE USE SWABS REGULAR) PADS, , Disp: , Rfl:    allopurinol (ZYLOPRIM) 100 MG tablet, Take 100 mg by mouth 2 (two) times daily., Disp: , Rfl:    amiodarone (PACERONE) 200 MG tablet, Take 1 tablet (200 mg total) by mouth daily., Disp: 90 tablet, Rfl: 3   amLODipine (NORVASC) 10 MG tablet, Take 1 tablet (10 mg total) by mouth daily., Disp: 30 tablet, Rfl: 3   ascorbic acid (VITAMIN C) 500 MG tablet, Take 500 mg by mouth daily with lunch., Disp: , Rfl:    atorvastatin (LIPITOR) 40 MG tablet, Take 40 mg by mouth daily., Disp: , Rfl:    Blood Glucose Calibration (TRUE METRIX LEVEL 1) Low SOLN, , Disp: , Rfl:    BREZTRI AEROSPHERE 160-9-4.8 MCG/ACT AERO, INHALE 2 PUFFS INTO THE LUNGS IN THE MORNING AND AT BEDTIME., Disp: 3 each, Rfl: 10   Carboxymethylcellulose Sodium (ARTIFICIAL TEARS OP), Place 1 drop into both eyes in the morning and at bedtime., Disp: , Rfl:    chlorthalidone (HYGROTON) 25 MG tablet, Take 25 mg by mouth daily., Disp: , Rfl:    collagenase (SANTYL) 250 UNIT/GM ointment, Apply 1 Application topically daily., Disp: 15 g, Rfl: 0   ELIQUIS 2.5 MG TABS tablet, TAKE 1 TABLET TWICE DAILY, Disp: 180 tablet, Rfl: 3   ferrous sulfate 325 (65 FE) MG tablet, Take 1 tablet (325 mg total) by mouth daily with breakfast., Disp: 90 tablet, Rfl: 3   fluticasone (FLONASE) 50 MCG/ACT nasal spray, USE 2 SPRAYS NASALLY DAILY, Disp: 48 g, Rfl: 1   gabapentin (NEURONTIN) 800 MG tablet, Take 800 mg by mouth See admin instructions. Take 800 mg by mouth three to four times a day, Disp: , Rfl:    HYDROcodone-acetaminophen (NORCO) 10-325 MG tablet, Take 1 tablet by mouth See admin instructions. Take 1 tablet by mouth three to four times a day, Disp: , Rfl:    insulin aspart (NOVOLOG) 100 UNIT/ML FlexPen, Inject 10-17 Units into the skin See admin instructions. Inject 10-17 units into the skin three times a day with meals "as needed," per SLIDING SCALE, Disp: , Rfl:    insulin glargine (LANTUS) 100 UNIT/ML  injection, Inject 0.55 mLs (55 Units total) into the skin 2 (two) times daily. (Patient taking differently: Inject 50-90 Units into the skin 2 (two) times daily after a meal.), Disp: 10 mL, Rfl: 11   levothyroxine (SYNTHROID) 200 MCG tablet, Take 200 mcg by mouth daily before breakfast., Disp: , Rfl:  losartan (COZAAR) 25 MG tablet, Take 25 mg by mouth daily., Disp: , Rfl:    metolazone (ZAROXOLYN) 5 MG tablet, Take one tablet by mouth daily for 3 days 30 minutes before taking your torsemide., Disp: 3 tablet, Rfl: 0   metoprolol succinate (TOPROL-XL) 100 MG 24 hr tablet, TAKE 1 TABLET EVERY DAY WITH OR IMMEDIATELY AFTER A MEAL, Disp: 90 tablet, Rfl: 3   naloxone (NARCAN) nasal spray 4 mg/0.1 mL, Place 4 mg into the nose once as needed for opioid reversal., Disp: , Rfl:    nitroGLYCERIN (NITROSTAT) 0.4 MG SL tablet, Place 1 tablet (0.4 mg total) under the tongue every 5 (five) minutes as needed for chest pain., Disp: 25 tablet, Rfl: 4   PRESCRIPTION MEDICATION, CPAP- At bedtime and as needed during any time of rest, Disp: , Rfl:    torsemide (DEMADEX) 20 MG tablet, Take 5 tablets (100 mg total) by mouth daily., Disp: 135 tablet, Rfl: 3   TRUE METRIX BLOOD GLUCOSE TEST test strip, , Disp: , Rfl:    TRUEplus Lancets 28G MISC, , Disp: , Rfl:    vitamin B-12 (CYANOCOBALAMIN) 1000 MCG tablet, Take 1,000 mcg by mouth daily with lunch., Disp: , Rfl:    Vitamin D3 (VITAMIN D) 25 MCG tablet, Take 1,000 Units by mouth daily with lunch., Disp: , Rfl:   Social History   Tobacco Use  Smoking Status Former   Packs/day: 2.00   Years: 33.00   Total pack years: 66.00   Types: Cigarettes   Start date: 1966   Quit date: 05/04/1997   Years since quitting: 25.0  Smokeless Tobacco Never    Allergies  Allergen Reactions   Dulaglutide Nausea Only, Other (See Comments) and Cough    TRULICITY made the patient sick to her stomach (only) several days after using it   Oxycodone Itching and Other (See Comments)     Pt still takes this med even thought it causes itching   Trelegy Ellipta [Fluticasone-Umeclidin-Vilant] Nausea And Vomiting, Other (See Comments) and Cough    Made the patient sick to her stomach (only) several days after using it    Objective:  There were no vitals filed for this visit. There is no height or weight on file to calculate BMI. Constitutional Well developed. Well nourished.  Vascular Dorsalis pedis pulses palpable bilaterally. Posterior tibial pulses palpable bilaterally. Capillary refill normal to all digits.  No cyanosis or clubbing noted. Pedal hair growth normal.  Neurologic Normal speech. Oriented to person, place, and time. Epicritic sensation to light touch grossly present bilaterally.  Dermatologic Painful ingrowing nail at lateral nail borders of the hallux nail right. No other open wounds. No skin lesions.  Orthopedic: Normal joint ROM without pain or crepitus bilaterally. No visible deformities. No bony tenderness.   Radiographs: None Assessment:   1. Ingrown toenail of right foot    Plan:  Patient was evaluated and treated and all questions answered.  Ingrown Nail, right -Patient elects to proceed with minor surgery to remove ingrown toenail removal today. Consent reviewed and signed by patient. -Ingrown nail excised. See procedure note. -Educated on post-procedure care including soaking. Written instructions provided and reviewed. -Patient to follow up in 2 weeks for nail check.  Procedure: Excision of Ingrown Toenail Location: Right 1st toe lateral nail borders. Anesthesia: Lidocaine 1% plain; 1.5 mL and Marcaine 0.5% plain; 1.5 mL, digital block. Skin Prep: Betadine. Dressing: Silvadene; telfa; dry, sterile, compression dressing. Technique: Following skin prep, the toe was exsanguinated and a  tourniquet was secured at the base of the toe. The affected nail border was freed, split with a nail splitter, and excised. Chemical matrixectomy was then  performed with phenol and irrigated out with alcohol. The tourniquet was then removed and sterile dressing applied. Disposition: Patient tolerated procedure well. Patient to return in 2 weeks for follow-up.   No follow-ups on file.

## 2022-05-21 ENCOUNTER — Other Ambulatory Visit: Payer: Self-pay | Admitting: Cardiology

## 2022-05-21 NOTE — Telephone Encounter (Signed)
*  STAT* If patient is at the pharmacy, call can be transferred to refill team.   1. Which medications need to be refilled? (please list name of each medication and dose if known)  Atorvastatin, Amiodarone, and Chlorthalidone  2. Which pharmacy/location (including street and city if local pharmacy) is medication to be sent to? [facility]  3. Do they need a 30 day or 90 day supply? 90 days and refills

## 2022-05-22 MED ORDER — ATORVASTATIN CALCIUM 40 MG PO TABS: 40.0000 mg | ORAL_TABLET | Freq: Every day | ORAL | 3 refills | Status: DC

## 2022-05-22 MED ORDER — AMIODARONE HCL 200 MG PO TABS: 200.0000 mg | ORAL_TABLET | Freq: Every day | ORAL | 3 refills | Status: DC

## 2022-05-22 NOTE — Telephone Encounter (Signed)
Pt is requesting a refill on chlorthalidone. Dr. Marlou Porch did not prescribe this medication. Would Dr. Marlou Porch like to reorder this medication? Please address

## 2022-05-27 ENCOUNTER — Telehealth: Payer: Self-pay | Admitting: Cardiology

## 2022-05-27 ENCOUNTER — Encounter: Payer: Self-pay | Admitting: Cardiology

## 2022-05-27 NOTE — Telephone Encounter (Signed)
Pt c/o medication issue:  1. Name of Medication:   amLODipine (NORVASC) 10 MG tablet    2. How are you currently taking this medication (dosage and times per day)?   3. Are you having a reaction (difficulty breathing--STAT)?   4. What is your medication issue?   Patient would like to clarify whether or not she should still be taking this medication. She states she has been notified that it was discontinued  by Dr. Marlou Porch and she would like to clarify. If not, she states she will need a refill.

## 2022-05-27 NOTE — Telephone Encounter (Signed)
Called pt in regards to amlodipine.  Pt reports needs a script sent into mail order pharmacy.  Advised pt med last sent in by Dr. Karleen Hampshire for amlodipine 10 mg PO QD.  Pt does not know who this provider is. Reports is taking amlodipine 5 mg PO QD.  Asked pt to look at pill bottle to ensure correct dose.  Pt reports amlodipine 5 mg PO QD filled 02/28/22 by Dr. Marlou Porch. Pt does not check BP regularly does not have a record of BP readings.  Advised pt to monitor BP at least 3 times a week to help determine dose of medication needed.  Per last cardiology AVS pt taking amlodipine 10 mg PO QD.  Will send to MD to advise dose of amlodipine.

## 2022-05-27 NOTE — Telephone Encounter (Signed)
Error

## 2022-05-28 DIAGNOSIS — E1140 Type 2 diabetes mellitus with diabetic neuropathy, unspecified: Secondary | ICD-10-CM | POA: Diagnosis not present

## 2022-05-28 DIAGNOSIS — E161 Other hypoglycemia: Secondary | ICD-10-CM | POA: Diagnosis not present

## 2022-05-28 DIAGNOSIS — T383X5A Adverse effect of insulin and oral hypoglycemic [antidiabetic] drugs, initial encounter: Secondary | ICD-10-CM | POA: Diagnosis not present

## 2022-05-28 DIAGNOSIS — E160 Drug-induced hypoglycemia without coma: Secondary | ICD-10-CM | POA: Diagnosis not present

## 2022-05-28 DIAGNOSIS — E162 Hypoglycemia, unspecified: Secondary | ICD-10-CM | POA: Diagnosis not present

## 2022-05-28 MED ORDER — AMLODIPINE BESYLATE 5 MG PO TABS: 5.0000 mg | ORAL_TABLET | Freq: Every day | ORAL | 3 refills | Status: DC

## 2022-05-28 NOTE — Telephone Encounter (Signed)
Called pt advised of MD response:    I am fine with giving her amlodipine 5 mg once a day. Thank you Candee Furbish, MD    Script sent to [place] per pt request.  Again advised pt to monitor BP.

## 2022-05-28 NOTE — Progress Notes (Signed)
Remote pacemaker transmission.   

## 2022-06-03 DIAGNOSIS — R809 Proteinuria, unspecified: Secondary | ICD-10-CM | POA: Diagnosis not present

## 2022-06-03 DIAGNOSIS — N2581 Secondary hyperparathyroidism of renal origin: Secondary | ICD-10-CM | POA: Diagnosis not present

## 2022-06-03 DIAGNOSIS — D631 Anemia in chronic kidney disease: Secondary | ICD-10-CM | POA: Diagnosis not present

## 2022-06-03 DIAGNOSIS — J449 Chronic obstructive pulmonary disease, unspecified: Secondary | ICD-10-CM | POA: Diagnosis not present

## 2022-06-03 DIAGNOSIS — N184 Chronic kidney disease, stage 4 (severe): Secondary | ICD-10-CM | POA: Diagnosis not present

## 2022-06-03 DIAGNOSIS — E1140 Type 2 diabetes mellitus with diabetic neuropathy, unspecified: Secondary | ICD-10-CM | POA: Diagnosis not present

## 2022-06-03 DIAGNOSIS — R609 Edema, unspecified: Secondary | ICD-10-CM | POA: Diagnosis not present

## 2022-06-03 DIAGNOSIS — I129 Hypertensive chronic kidney disease with stage 1 through stage 4 chronic kidney disease, or unspecified chronic kidney disease: Secondary | ICD-10-CM | POA: Diagnosis not present

## 2022-06-03 DIAGNOSIS — N189 Chronic kidney disease, unspecified: Secondary | ICD-10-CM | POA: Diagnosis not present

## 2022-06-03 DIAGNOSIS — I5030 Unspecified diastolic (congestive) heart failure: Secondary | ICD-10-CM | POA: Diagnosis not present

## 2022-06-05 DIAGNOSIS — Z1389 Encounter for screening for other disorder: Secondary | ICD-10-CM | POA: Diagnosis not present

## 2022-06-05 DIAGNOSIS — M8580 Other specified disorders of bone density and structure, unspecified site: Secondary | ICD-10-CM | POA: Diagnosis not present

## 2022-06-05 DIAGNOSIS — M25569 Pain in unspecified knee: Secondary | ICD-10-CM | POA: Diagnosis not present

## 2022-06-05 DIAGNOSIS — E349 Endocrine disorder, unspecified: Secondary | ICD-10-CM | POA: Diagnosis not present

## 2022-06-05 DIAGNOSIS — M353 Polymyalgia rheumatica: Secondary | ICD-10-CM | POA: Diagnosis not present

## 2022-06-05 DIAGNOSIS — M1710 Unilateral primary osteoarthritis, unspecified knee: Secondary | ICD-10-CM | POA: Diagnosis not present

## 2022-06-05 DIAGNOSIS — Z79891 Long term (current) use of opiate analgesic: Secondary | ICD-10-CM | POA: Diagnosis not present

## 2022-06-05 DIAGNOSIS — G894 Chronic pain syndrome: Secondary | ICD-10-CM | POA: Diagnosis not present

## 2022-06-05 DIAGNOSIS — M549 Dorsalgia, unspecified: Secondary | ICD-10-CM | POA: Diagnosis not present

## 2022-06-16 DIAGNOSIS — H35033 Hypertensive retinopathy, bilateral: Secondary | ICD-10-CM | POA: Diagnosis not present

## 2022-06-16 DIAGNOSIS — E113213 Type 2 diabetes mellitus with mild nonproliferative diabetic retinopathy with macular edema, bilateral: Secondary | ICD-10-CM | POA: Diagnosis not present

## 2022-06-16 DIAGNOSIS — H43813 Vitreous degeneration, bilateral: Secondary | ICD-10-CM | POA: Diagnosis not present

## 2022-06-16 DIAGNOSIS — H353132 Nonexudative age-related macular degeneration, bilateral, intermediate dry stage: Secondary | ICD-10-CM | POA: Diagnosis not present

## 2022-06-18 ENCOUNTER — Telehealth: Payer: Self-pay

## 2022-06-18 NOTE — Patient Outreach (Signed)
  Care Coordination   Initial Visit Note   06/18/2022 Name: Kayla [name] MRN: [id number] DOB: 1941/01/06  Kayla [name] is a 82 y.o. year old female who sees Hamrick, Lorin Mercy, MD for primary care. I spoke with  [name] by phone today.  What matters to the patients health and wellness today?  Placed call to patient to review and offer [facility] care coordination program.  Patient reports that she is doing well and denies any needs. Reports she is in close contact with MD.     SDOH assessments and interventions completed:  No     Care Coordination Interventions:  No, not indicated   Follow up plan: No further intervention required.   Encounter Outcome:  Pt. Refused   Tomasa Rand, RN, BSN, CEN [facility] [employer] [phone]

## 2022-06-23 ENCOUNTER — Other Ambulatory Visit: Payer: Self-pay | Admitting: Cardiology

## 2022-06-23 DIAGNOSIS — R200 Anesthesia of skin: Secondary | ICD-10-CM

## 2022-06-25 DIAGNOSIS — N184 Chronic kidney disease, stage 4 (severe): Secondary | ICD-10-CM | POA: Diagnosis not present

## 2022-06-25 DIAGNOSIS — Z794 Long term (current) use of insulin: Secondary | ICD-10-CM | POA: Diagnosis not present

## 2022-06-25 DIAGNOSIS — N2581 Secondary hyperparathyroidism of renal origin: Secondary | ICD-10-CM | POA: Diagnosis not present

## 2022-06-25 DIAGNOSIS — I5030 Unspecified diastolic (congestive) heart failure: Secondary | ICD-10-CM | POA: Diagnosis not present

## 2022-06-25 DIAGNOSIS — E890 Postprocedural hypothyroidism: Secondary | ICD-10-CM | POA: Diagnosis not present

## 2022-06-25 DIAGNOSIS — I495 Sick sinus syndrome: Secondary | ICD-10-CM | POA: Diagnosis not present

## 2022-06-25 DIAGNOSIS — E790 Hyperuricemia without signs of inflammatory arthritis and tophaceous disease: Secondary | ICD-10-CM | POA: Diagnosis not present

## 2022-06-25 DIAGNOSIS — E1140 Type 2 diabetes mellitus with diabetic neuropathy, unspecified: Secondary | ICD-10-CM | POA: Diagnosis not present

## 2022-07-02 DIAGNOSIS — E1140 Type 2 diabetes mellitus with diabetic neuropathy, unspecified: Secondary | ICD-10-CM | POA: Diagnosis not present

## 2022-07-02 DIAGNOSIS — N184 Chronic kidney disease, stage 4 (severe): Secondary | ICD-10-CM | POA: Diagnosis not present

## 2022-07-02 DIAGNOSIS — I5030 Unspecified diastolic (congestive) heart failure: Secondary | ICD-10-CM | POA: Diagnosis not present

## 2022-07-02 DIAGNOSIS — J449 Chronic obstructive pulmonary disease, unspecified: Secondary | ICD-10-CM | POA: Diagnosis not present

## 2022-07-03 ENCOUNTER — Encounter: Payer: Self-pay | Admitting: Adult Health

## 2022-07-03 ENCOUNTER — Telehealth: Payer: Self-pay | Admitting: *Deleted

## 2022-07-03 ENCOUNTER — Ambulatory Visit: Payer: Medicare HMO | Admitting: Adult Health

## 2022-07-03 VITALS — BP 120/60 | HR 81 | Temp 97.9°F | Ht 65.0 in | Wt 234.0 lb

## 2022-07-03 DIAGNOSIS — J449 Chronic obstructive pulmonary disease, unspecified: Secondary | ICD-10-CM | POA: Diagnosis not present

## 2022-07-03 DIAGNOSIS — I5032 Chronic diastolic (congestive) heart failure: Secondary | ICD-10-CM

## 2022-07-03 DIAGNOSIS — G4733 Obstructive sleep apnea (adult) (pediatric): Secondary | ICD-10-CM

## 2022-07-03 DIAGNOSIS — J9611 Chronic respiratory failure with hypoxia: Secondary | ICD-10-CM | POA: Diagnosis not present

## 2022-07-03 MED ORDER — ALBUTEROL SULFATE HFA 108 (90 BASE) MCG/ACT IN AERS: 2.0000 | INHALATION_SPRAY | Freq: Four times a day (QID) | RESPIRATORY_TRACT | 3 refills | Status: DC | PRN

## 2022-07-03 MED ORDER — ALBUTEROL SULFATE (2.5 MG/3ML) 0.083% IN NEBU: 2.5000 mg | INHALATION_SOLUTION | RESPIRATORY_TRACT | 5 refills | Status: DC | PRN

## 2022-07-03 MED ORDER — BREZTRI AEROSPHERE 160-9-4.8 MCG/ACT IN AERO: 2.0000 | INHALATION_SPRAY | Freq: Two times a day (BID) | RESPIRATORY_TRACT | 0 refills | Status: DC

## 2022-07-03 MED ORDER — BREZTRI AEROSPHERE 160-9-4.8 MCG/ACT IN AERO: 2.0000 | INHALATION_SPRAY | Freq: Two times a day (BID) | RESPIRATORY_TRACT | 10 refills | Status: DC

## 2022-07-03 NOTE — Assessment & Plan Note (Signed)
Excellent control on nocturnal CPAP.  Continue current settings.  Plan  Patient Instructions  Mucinex DM Twice daily  As needed  cough/congestion  Continue on BREZTRI 2 puffs Twice daily, rinse after use  Albuterol inhaler /Neb As needed   Flutter valve Twice daily   Continue follow up with Cardiology and Nephrology  Continue on Oxygen 3l/m .  Continue on CPAP At bedtime   Follow up with 4 months and As needed .  Please contact office for sooner follow up if symptoms do not improve or worsen or seek emergency care

## 2022-07-03 NOTE — Telephone Encounter (Signed)
Can we get patient scheduled for a new patient appointment with Dr Jenetta Downer   Thank you

## 2022-07-03 NOTE — Progress Notes (Signed)
$'@Patient'I$  ID: Kayla [name], female    DOB: [birth year], 82 y.o.   MRN: [id number]  Chief Complaint  Patient presents with   Follow-up    Referring provider: HamrickLorin Mercy, MD  HPI: 82 yo female former smoker followed for COPD, Chronic respiratory failure and OSA.  Medical history significant for insulin-dependent diabetes, A-fib on Eliquis and amiodarone, chronic anemia and diastolic heart failure and chronic kidney disease    TEST/EVENTS :  Spirometry 10/19/08 >> FEV1 1.55 (68%), FEV1% 70  Spirometry 08/29/12 >> FEV1 1.38 (59%), FEV1% 58 FeNO 03/09/16 >> 7   Chest Imaging:  HRCT chest 11/16/20 >> atherosclerosis, scattered areas of scarring, several tiny nodules up to 2 mm   Sleep Tests:  PSG 02/2005 >> AHI 10  CPAP titration 02/19/14 >> CPAP 11 cm H2O >> AHI 0.7, PLMI 81.4, did not need supplemental oxygen Auto CPAP 12/13/21 to 01/11/22 >> used on 29 of 30 nights with average 6 hrs 35 min.  Average AHI 2 with median CPAP 7 and 95 th percentile CPAP 11 cm H2O   Cardiac Tests:  Echo 10/03/20 >> EF 60 to 65%, severe LVH, grade 2 DD  07/03/2022 Follow up: COPD and OSA  Patient presents for a 54-monthfollow-up.  Patient has underlying moderate to severe COPD with chronic bronchitis.  She has been intolerant to Trelegy, Incruse and Spiriva in the past.  She is currently taking Breztri twice daily.  Patient says that she feels that her breathing has been the best it has been in a long time.  She has had no flare of cough or wheezing.  She does use her albuterol nebulizer on occasion.  She remains on oxygen 3 L.  She has had no increased oxygen demands. She is on CPAP at bedtime for sleep apnea.  Patient says she never goes without her CPAP.  She says she is doing well on it.  Feels that she benefits from CPAP with decreased daytime sleepiness.  CPAP download shows 100% compliance.  Daily average usage at 8 hours.  Patient is on AutoSet 5 to 15 cm H2O.  Daily average pressure at 12.  AHI  1.3/hour.  Has balance issues, prone to frequent falls. Uses walker at home.   Allergies  Allergen Reactions   Dulaglutide Nausea Only, Other (See Comments) and Cough    TRULICITY made the patient sick to her stomach (only) several days after using it   Oxycodone Itching and Other (See Comments)    Pt still takes this med even thought it causes itching   Trelegy Ellipta [Fluticasone-Umeclidin-Vilant] Nausea And Vomiting, Other (See Comments) and Cough    Made the patient sick to her stomach (only) several days after using it     Immunization History  Administered Date(s) Administered   Fluad Quad(high Dose 65+) 01/04/2019, 01/30/2022   Influenza Split 03/05/2011, 01/08/2017, 02/10/2018   Influenza Whole 03/18/2009, 02/03/2010, 04/03/2012   Influenza, High Dose Seasonal PF 02/04/2016, 02/23/2017, 01/03/2020   Influenza,inj,Quad PF,6+ Mos 02/10/2013, 03/04/2014, 03/17/2015   Influenza,inj,quad, With Preservative 01/20/2018   Moderna Sars-Covid-2 Vaccination 05/17/2019, 06/13/2019, 03/05/2020   Pneumococcal Polysaccharide-23 02/03/2010   Tdap 03/04/2014   Zoster, Live 10/02/2013    Past Medical History:  Diagnosis Date   Allergy    Anxiety    Arthritis    CAD (coronary artery disease)    Mild per remote cath in 2006, /    Nuclear, January, 2013, no significant abnormality,   Carotid artery disease ([place]  Doppler, January, 2013, 0 [id number] RIC A., [id number] LICA, stable   Cataract    CHF (congestive heart failure) (HCC)    Chronic kidney disease    Complication of anesthesia    wakes up "mean"   COPD 02/16/2007   Intolerant of Spiriva, tudorza Intolerant of Trelegy    COPD (chronic obstructive pulmonary disease) ([place])    Coronary atherosclerosis 11/24/2008   Catheterization 2006, nonobstructive coronary disease   //   nuclear 2013, low risk     Diabetes mellitus, type 2 ([place])    Diverticula of colon    DIVERTICULOSIS OF COLON 03/23/2007   Qualifier: Diagnosis of  By: Halford Chessman MD,  [place]     Dizziness    occasional   Dizzy spells 05/04/2011   DM (diabetes mellitus) ([place]) 03/23/2007   Qualifier: Diagnosis of  By: Halford Chessman MD, [place]     Dyspnea    On exertion   Ejection fraction    Essential hypertension 02/16/2007   Qualifier: Diagnosis of  By: Rosana Hoes CMA, Breckyn Ticas     G E R D 03/23/2007   Qualifier: Diagnosis of  By: Halford Chessman MD, [place]     Gall bladder disease 04/28/2016   Gastritis and gastroduodenitis    GERD (gastroesophageal reflux disease)    Goiter    hx of   Headache(784.0)    migraine   Hyperlipidemia    Hypertension    Hypothyroidism    HYPOTHYROIDISM, POSTSURGICAL 06/04/2008   Qualifier: Diagnosis of  By: Loanne Drilling MD, Sean A    Left ventricular hypertrophy    Leg edema    occasional swelling   Leg swelling 12/09/2018   Myofascial pain syndrome, cervical 01/06/2018   Obesity    OBESITY 11/24/2008   Qualifier: Diagnosis of  By: Sidney Ace     OBSTRUCTIVE SLEEP APNEA 02/16/2007   Auto CPAP 09/03/13 to 10/02/13 >> used on 30 of 30 nights with average 6 hrs and 3 min.  Average AHI is 3.1 with median CPAP 5 cm H2O and 95 th percentile CPAP 6 cm H2O.     Occipital neuralgia of right side 01/06/2018   OSA (obstructive sleep apnea)    cpap setting of 12   Pain in joint of right hip 01/21/2018   Palpitations 01/21/2011   48 hour Holter, 2015, scattered PACs and PVCs.    Paroxysmal atrial fibrillation (HCC)    Pneumonia    PONV (postoperative nausea and vomiting)    severe after every surgery   Pulmonary hypertension, secondary    RHINITIS 07/21/2010        RUQ abdominal pain    S/P left TKA 07/27/2011   Sinus node dysfunction (HCC) 11/08/2015   Sleep apnea    Spinal stenosis of cervical region 11/07/2014   Urinary frequency 12/09/2018   UTI (urinary tract infection) 08/15/2019   per pt report   VENTRICULAR HYPERTROPHY, LEFT 11/24/2008   Qualifier: Diagnosis of  By: Sidney Ace      Tobacco History: Social History   Tobacco Use  Smoking  Status Former   Packs/day: 2.00   Years: 33.00   Total pack years: 66.00   Types: Cigarettes   Start date: 1966   Quit date: 05/04/1997   Years since quitting: 25.1  Smokeless Tobacco Never   Counseling given: Not Answered   Outpatient Medications Prior to Visit  Medication Sig Dispense Refill   acetaminophen (TYLENOL) 500 MG tablet Take 1,000-1,500 mg by mouth every 8 (eight) hours as needed for mild pain, moderate  pain or headache.     Alcohol Swabs (B-D SINGLE USE SWABS REGULAR) PADS      allopurinol (ZYLOPRIM) 100 MG tablet Take 100 mg by mouth 2 (two) times daily.     amiodarone (PACERONE) 200 MG tablet Take 1 tablet (200 mg total) by mouth daily. 90 tablet 3   amLODipine (NORVASC) 5 MG tablet Take 1 tablet (5 mg total) by mouth daily. 90 tablet 3   ascorbic acid (VITAMIN C) 500 MG tablet Take 500 mg by mouth daily with lunch.     atorvastatin (LIPITOR) 40 MG tablet Take 1 tablet (40 mg total) by mouth daily. 90 tablet 3   Blood Glucose Calibration (TRUE METRIX LEVEL 1) Low SOLN      Carboxymethylcellulose Sodium (ARTIFICIAL TEARS OP) Place 1 drop into both eyes in the morning and at bedtime.     chlorthalidone (HYGROTON) 25 MG tablet Take 25 mg by mouth daily.     collagenase (SANTYL) 250 UNIT/GM ointment Apply 1 Application topically daily. 15 g 0   ELIQUIS 2.5 MG TABS tablet TAKE 1 TABLET TWICE DAILY 180 tablet 3   ferrous sulfate 325 (65 FE) MG tablet Take 1 tablet (325 mg total) by mouth daily with breakfast. 90 tablet 3   fluticasone (FLONASE) 50 MCG/ACT nasal spray USE 2 SPRAYS NASALLY DAILY 48 g 1   gabapentin (NEURONTIN) 800 MG tablet Take 800 mg by mouth See admin instructions. Take 800 mg by mouth three to four times a day     HYDROcodone-acetaminophen (NORCO) 10-325 MG tablet Take 1 tablet by mouth See admin instructions. Take 1 tablet by mouth three to four times a day     insulin aspart (NOVOLOG) 100 UNIT/ML FlexPen Inject 10-17 Units into the skin See admin  instructions. Inject 10-17 units into the skin three times a day with meals "as needed," per SLIDING SCALE     insulin glargine (LANTUS) 100 UNIT/ML injection Inject 0.55 mLs (55 Units total) into the skin 2 (two) times daily. (Patient taking differently: Inject 50-90 Units into the skin 2 (two) times daily after a meal.) 10 mL 11   levothyroxine (SYNTHROID) 200 MCG tablet Take 200 mcg by mouth daily before breakfast.     losartan (COZAAR) 25 MG tablet Take 25 mg by mouth daily.     metolazone (ZAROXOLYN) 5 MG tablet Take one tablet by mouth daily for 3 days 30 minutes before taking your torsemide. 3 tablet 0   metoprolol succinate (TOPROL-XL) 100 MG 24 hr tablet TAKE 1 TABLET EVERY DAY WITH OR IMMEDIATELY AFTER A MEAL 90 tablet 3   naloxone (NARCAN) nasal spray 4 mg/0.1 mL Place 4 mg into the nose once as needed for opioid reversal.     nitroGLYCERIN (NITROSTAT) 0.4 MG SL tablet Place 1 tablet (0.4 mg total) under the tongue every 5 (five) minutes as needed for chest pain. 25 tablet 4   PRESCRIPTION MEDICATION CPAP- At bedtime and as needed during any time of rest     torsemide (DEMADEX) 20 MG tablet Take 5 tablets (100 mg total) by mouth daily. 135 tablet 3   TRUE METRIX BLOOD GLUCOSE TEST test strip      TRUEplus Lancets 28G MISC      vitamin B-12 (CYANOCOBALAMIN) 1000 MCG tablet Take 1,000 mcg by mouth daily with lunch.     Vitamin D3 (VITAMIN D) 25 MCG tablet Take 1,000 Units by mouth daily with lunch.     albuterol (PROVENTIL) (2.5 MG/3ML) 0.083% nebulizer solution  Take 3 mLs (2.5 mg total) by nebulization every 2 (two) hours as needed for wheezing or shortness of breath. 75 mL 5   albuterol (VENTOLIN HFA) 108 (90 Base) MCG/ACT inhaler Inhale 2 puffs into the lungs every 6 (six) hours as needed for wheezing or shortness of breath. 3 each 3   BREZTRI AEROSPHERE 160-9-4.8 MCG/ACT AERO INHALE 2 PUFFS INTO THE LUNGS IN THE MORNING AND AT BEDTIME. 3 each 10   No facility-administered medications  prior to visit.     Review of Systems:   Constitutional:   No  weight loss, night sweats,  Fevers, chills, fatigue, or  lassitude.  HEENT:   No headaches,  Difficulty swallowing,  Tooth/dental problems, or  Sore throat,                No sneezing, itching, ear ache, nasal congestion, post nasal drip,   CV:  No chest pain,  Orthopnea, PND, anasarca, dizziness, palpitations, syncope.   GI  No heartburn, indigestion, abdominal pain, nausea, vomiting, diarrhea, change in bowel habits, loss of appetite, bloody stools.   Resp:  No excess mucus, no productive cough,  No non-productive cough,  No coughing up of blood.  No change in color of mucus.  No wheezing.  No chest wall deformity  Skin: no rash or lesions.  GU: no dysuria, change in color of urine, no urgency or frequency.  No flank pain, no hematuria   MS:  No joint pain or swelling.  Balance issues    Physical Exam  BP 120/60 (BP Location: Right Arm, Patient Position: Sitting, Cuff Size: Large)   Pulse 81   Temp 97.9 F (36.6 C) (Oral)   Ht '5\' 5"'$  (1.651 m)   Wt 234 lb (106.1 kg)   SpO2 95%   BMI 38.94 kg/m   GEN: A/Ox3; pleasant , NAD, well nourished, wheelchair, oxygen   HEENT:  [place]/AT,  NOSE-clear, THROAT-clear, no lesions, no postnasal drip or exudate noted.  Class III MP airway  NECK:  Supple w/ fair ROM; no JVD; normal carotid impulses w/o bruits; no thyromegaly or nodules palpated; no lymphadenopathy.    RESP  Clear  P & A; w/o, wheezes/ rales/ or rhonchi. no accessory muscle use, no dullness to percussion  CARD:  RRR, no m/r/g, tr  peripheral edema, pulses intact, no cyanosis or clubbing.  GI:   Soft & nt; nml bowel sounds; no organomegaly or masses detected.   Musco: Warm bil, no deformities or joint swelling noted.   Neuro: alert, no focal deficits noted.    Skin: Warm, no lesions or rashes    Lab Results:        Imaging: No results found.        No data to display          Lab  Results  Component Value Date   NITRICOXIDE 7 03/09/2016        Assessment & Plan:   COPD (chronic obstructive pulmonary disease) ([place]) Excellent control on current maintenance regimen.  No changes  Plan  Patient Instructions  Mucinex DM Twice daily  As needed  cough/congestion  Continue on BREZTRI 2 puffs Twice daily, rinse after use  Albuterol inhaler /Neb As needed   Flutter valve Twice daily   Continue follow up with Cardiology and Nephrology  Continue on Oxygen 3l/m .  Continue on CPAP At bedtime   Follow up with 4 months and As needed .  Please contact office for sooner follow up if  symptoms do not improve or worsen or seek emergency care     Chronic respiratory failure with hypoxia ([place]) Continue on oxygen to maintain O2 saturations greater than 88 to 90%  OSA (obstructive sleep apnea) Excellent control on nocturnal CPAP.  Continue current settings.  Plan  Patient Instructions  Mucinex DM Twice daily  As needed  cough/congestion  Continue on BREZTRI 2 puffs Twice daily, rinse after use  Albuterol inhaler /Neb As needed   Flutter valve Twice daily   Continue follow up with Cardiology and Nephrology  Continue on Oxygen 3l/m .  Continue on CPAP At bedtime   Follow up with 4 months and As needed .  Please contact office for sooner follow up if symptoms do not improve or worsen or seek emergency care     Chronic diastolic heart failure ([place]) Appears euvolemic-continue on current regimen and follow-up with cardiology     Rexene Edison, NP 07/03/2022

## 2022-07-03 NOTE — Assessment & Plan Note (Signed)
Excellent control on current maintenance regimen.  No changes  Plan  Patient Instructions  Mucinex DM Twice daily  As needed  cough/congestion  Continue on BREZTRI 2 puffs Twice daily, rinse after use  Albuterol inhaler /Neb As needed   Flutter valve Twice daily   Continue follow up with Cardiology and Nephrology  Continue on Oxygen 3l/m .  Continue on CPAP At bedtime   Follow up with 4 months and As needed .  Please contact office for sooner follow up if symptoms do not improve or worsen or seek emergency care

## 2022-07-03 NOTE — Telephone Encounter (Signed)
Dr. Halford Chessman, Patient would prefer to continue to be seen at the [facility]. Location as it is closer for her to drive.  Please advise if ok to change provider to Dr. Ander Slade.  Thank you.

## 2022-07-03 NOTE — Telephone Encounter (Signed)
Okay with me to switch

## 2022-07-03 NOTE — Telephone Encounter (Signed)
I am okay with switch. 

## 2022-07-03 NOTE — Progress Notes (Signed)
Reviewed and agree with assessment/plan.   Chesley Mires, MD [facility] Pulmonary/Critical Care 07/03/2022, 10:12 AM Pager:  [phone]

## 2022-07-03 NOTE — Assessment & Plan Note (Signed)
Appears euvolemic-continue on current regimen and follow-up with cardiology

## 2022-07-03 NOTE — Patient Instructions (Addendum)
Mucinex DM Twice daily  As needed  cough/congestion  Continue on BREZTRI 2 puffs Twice daily, rinse after use  Albuterol inhaler /Neb As needed   Flutter valve Twice daily   Continue follow up with Cardiology and Nephrology  Continue on Oxygen 3l/m .  Continue on CPAP At bedtime   Follow up with 4 months and As needed .  Please contact office for sooner follow up if symptoms do not improve or worsen or seek emergency care

## 2022-07-03 NOTE — Assessment & Plan Note (Signed)
Continue on oxygen to maintain O2 saturations greater than 88 to 90%. 

## 2022-07-03 NOTE — Telephone Encounter (Signed)
Waiting for Dr. Judson Roch response.

## 2022-07-06 DIAGNOSIS — Z79891 Long term (current) use of opiate analgesic: Secondary | ICD-10-CM | POA: Diagnosis not present

## 2022-07-06 DIAGNOSIS — G894 Chronic pain syndrome: Secondary | ICD-10-CM | POA: Diagnosis not present

## 2022-07-06 DIAGNOSIS — M353 Polymyalgia rheumatica: Secondary | ICD-10-CM | POA: Diagnosis not present

## 2022-07-06 DIAGNOSIS — Z1389 Encounter for screening for other disorder: Secondary | ICD-10-CM | POA: Diagnosis not present

## 2022-07-06 DIAGNOSIS — E349 Endocrine disorder, unspecified: Secondary | ICD-10-CM | POA: Diagnosis not present

## 2022-07-06 DIAGNOSIS — M549 Dorsalgia, unspecified: Secondary | ICD-10-CM | POA: Diagnosis not present

## 2022-07-06 DIAGNOSIS — M25569 Pain in unspecified knee: Secondary | ICD-10-CM | POA: Diagnosis not present

## 2022-07-06 DIAGNOSIS — M8580 Other specified disorders of bone density and structure, unspecified site: Secondary | ICD-10-CM | POA: Diagnosis not present

## 2022-07-06 DIAGNOSIS — M1710 Unilateral primary osteoarthritis, unspecified knee: Secondary | ICD-10-CM | POA: Diagnosis not present

## 2022-07-07 ENCOUNTER — Ambulatory Visit (HOSPITAL_COMMUNITY)
Admission: RE | Admit: 2022-07-07 | Discharge: 2022-07-07 | Disposition: A | Discharge status: Home / Self Care | Payer: Medicare HMO | Source: Ambulatory Visit | Attending: Cardiology | Admitting: Cardiology

## 2022-07-07 DIAGNOSIS — R202 Paresthesia of skin: Secondary | ICD-10-CM | POA: Diagnosis not present

## 2022-07-07 DIAGNOSIS — R200 Anesthesia of skin: Secondary | ICD-10-CM | POA: Insufficient documentation

## 2022-07-07 LAB — VAS US ABI WITH/WO TBI: Right ABI: 1.31

## 2022-07-08 DIAGNOSIS — N184 Chronic kidney disease, stage 4 (severe): Secondary | ICD-10-CM | POA: Diagnosis not present

## 2022-07-20 ENCOUNTER — Ambulatory Visit (INDEPENDENT_AMBULATORY_CARE_PROVIDER_SITE_OTHER): Payer: Medicare HMO

## 2022-07-20 DIAGNOSIS — I495 Sick sinus syndrome: Secondary | ICD-10-CM | POA: Diagnosis not present

## 2022-07-22 LAB — CUP PACEART REMOTE DEVICE CHECK
Battery Remaining Longevity: 39 mo
Battery Voltage: 2.96 V
Brady Statistic AP VP Percent: 1.57 %
Brady Statistic AP VS Percent: 98.26 %
Brady Statistic AS VP Percent: 0.01 %
Brady Statistic AS VS Percent: 0.16 %
Brady Statistic RA Percent Paced: 99.8 %
Brady Statistic RV Percent Paced: 1.79 %
Date Time Interrogation Session: 20240318090812
Implantable Lead Connection Status: 753985
Implantable Lead Connection Status: 753985
Implantable Lead Implant Date: 20170707
Implantable Lead Implant Date: 20170707
Implantable Lead Location: 753859
Implantable Lead Location: 753860
Implantable Lead Model: 3830
Implantable Lead Model: 5076
Implantable Pulse Generator Implant Date: 20170707
Lead Channel Impedance Value: 266 Ohm
Lead Channel Impedance Value: 380 Ohm
Lead Channel Impedance Value: 456 Ohm
Lead Channel Impedance Value: 494 Ohm
Lead Channel Pacing Threshold Amplitude: 1.25 V
Lead Channel Pacing Threshold Amplitude: 1.875 V
Lead Channel Pacing Threshold Pulse Width: 0.4 ms
Lead Channel Pacing Threshold Pulse Width: 0.4 ms
Lead Channel Sensing Intrinsic Amplitude: 3.875 mV
Lead Channel Sensing Intrinsic Amplitude: 3.875 mV
Lead Channel Sensing Intrinsic Amplitude: 6.25 mV
Lead Channel Sensing Intrinsic Amplitude: 6.25 mV
Lead Channel Setting Pacing Amplitude: 2.5 V
Lead Channel Setting Pacing Amplitude: 2.5 V
Lead Channel Setting Pacing Pulse Width: 1 ms
Lead Channel Setting Sensing Sensitivity: 2.8 mV
Zone Setting Status: 755011
Zone Setting Status: 755011

## 2022-07-23 ENCOUNTER — Encounter: Payer: Self-pay | Admitting: Pulmonary Disease

## 2022-07-23 ENCOUNTER — Ambulatory Visit: Payer: Medicare HMO | Admitting: Pulmonary Disease

## 2022-07-23 VITALS — BP 118/78 | HR 71 | Ht 65.0 in | Wt 237.0 lb

## 2022-07-23 DIAGNOSIS — J9611 Chronic respiratory failure with hypoxia: Secondary | ICD-10-CM

## 2022-07-23 DIAGNOSIS — G4733 Obstructive sleep apnea (adult) (pediatric): Secondary | ICD-10-CM

## 2022-07-23 DIAGNOSIS — J449 Chronic obstructive pulmonary disease, unspecified: Secondary | ICD-10-CM

## 2022-07-23 NOTE — Patient Instructions (Signed)
I will see you back in 3 months  Continue using your CPAP nightly  Continue using Breztri on a regular basis  Stay as active as you safely can  Call us with significant concerns

## 2022-07-23 NOTE — Progress Notes (Signed)
Kayla [name]    QK:[id number]    24-Jun-1940  Primary Care Physician:Hamrick, Lorin Mercy, MD  Referring Physician: [name], MD [address] [place],  [place] [address]  Chief complaint:   Patient with a history of chronic obstructive pulmonary disease, obstructive sleep apnea  HPI:  Seen as a new patient today  She does have COPD -On Breztri Breathing has been stable She is benefiting from [place] and using it regularly 2 puffs twice a day She does have an albuterol rescue inhaler -She did not bring her inhalers with her today  History of obstructive sleep apnea Compliant with CPAP Uses CPAP nightly Wakes up feeling like she is at a good nights rest has no concerns about her CPAP  Patient has a lot of back pain and hip pain which limits ability to ambulate She has had a few falls recently Has not been in the hospital recently, last hospitalization was related to heart failure about 5 to 6 months ago  She ambulates with a walker Only able to do a few steps because of balance issues relating to chronic pain  She does follow with pain management  Reformed smoker, history of chronic respiratory failure, insulin-dependent diabetes, atrial fibrillation, diastolic heart failure, chronic kidney disease  Last PSG was October 2006-AHI of 10 Was titrated to CPAP of 11  Did not tolerate Trelegy, Incruse, Spiriva in the past, breathing has been best with [name] On 3 L of oxygen chronically  Outpatient Encounter Medications as of 07/23/2022  Medication Sig   acetaminophen (TYLENOL) 500 MG tablet Take 1,000-1,500 mg by mouth every 8 (eight) hours as needed for mild pain, moderate pain or headache.   albuterol (PROVENTIL) (2.5 MG/3ML) 0.083% nebulizer solution Take 3 mLs (2.5 mg total) by nebulization every 2 (two) hours as needed for wheezing or shortness of breath.   albuterol (VENTOLIN HFA) 108 (90 Base) MCG/ACT inhaler Inhale 2 puffs into the lungs every 6  (six) hours as needed for wheezing or shortness of breath.   Alcohol Swabs (B-D SINGLE USE SWABS REGULAR) PADS    allopurinol (ZYLOPRIM) 100 MG tablet Take 100 mg by mouth 2 (two) times daily.   amiodarone (PACERONE) 200 MG tablet Take 1 tablet (200 mg total) by mouth daily.   amLODipine (NORVASC) 5 MG tablet Take 1 tablet (5 mg total) by mouth daily.   ascorbic acid (VITAMIN C) 500 MG tablet Take 500 mg by mouth daily with lunch.   atorvastatin (LIPITOR) 40 MG tablet Take 1 tablet (40 mg total) by mouth daily.   Blood Glucose Calibration (TRUE METRIX LEVEL 1) Low SOLN    Budeson-Glycopyrrol-Formoterol (BREZTRI AEROSPHERE) 160-9-4.8 MCG/ACT AERO Inhale 2 puffs into the lungs in the morning and at bedtime.   Budeson-Glycopyrrol-Formoterol (BREZTRI AEROSPHERE) 160-9-4.8 MCG/ACT AERO Inhale 2 puffs into the lungs in the morning and at bedtime.   Carboxymethylcellulose Sodium (ARTIFICIAL TEARS OP) Place 1 drop into both eyes in the morning and at bedtime.   chlorthalidone (HYGROTON) 25 MG tablet Take 25 mg by mouth daily.   collagenase (SANTYL) 250 UNIT/GM ointment Apply 1 Application topically daily.   ELIQUIS 2.5 MG TABS tablet TAKE 1 TABLET TWICE DAILY   ferrous sulfate 325 (65 FE) MG tablet Take 1 tablet (325 mg total) by mouth daily with breakfast.   fluticasone (FLONASE) 50 MCG/ACT nasal spray USE 2 SPRAYS NASALLY DAILY   gabapentin (NEURONTIN) 800 MG tablet Take 800 mg by mouth See  admin instructions. Take 800 mg by mouth three to four times a day   HYDROcodone-acetaminophen (NORCO) 10-325 MG tablet Take 1 tablet by mouth See admin instructions. Take 1 tablet by mouth three to four times a day   insulin aspart (NOVOLOG) 100 UNIT/ML FlexPen Inject 10-17 Units into the skin See admin instructions. Inject 10-17 units into the skin three times a day with meals "as needed," per SLIDING SCALE   insulin glargine (LANTUS) 100 UNIT/ML injection Inject 0.55 mLs (55 Units total) into the skin 2 (two)  times daily. (Patient taking differently: Inject 50-90 Units into the skin 2 (two) times daily after a meal.)   levothyroxine (SYNTHROID) 200 MCG tablet Take 200 mcg by mouth daily before breakfast.   losartan (COZAAR) 25 MG tablet Take 25 mg by mouth daily.   metolazone (ZAROXOLYN) 5 MG tablet Take one tablet by mouth daily for 3 days 30 minutes before taking your torsemide.   metoprolol succinate (TOPROL-XL) 100 MG 24 hr tablet TAKE 1 TABLET EVERY DAY WITH OR IMMEDIATELY AFTER A MEAL   naloxone (NARCAN) nasal spray 4 mg/0.1 mL Place 4 mg into the nose once as needed for opioid reversal.   nitroGLYCERIN (NITROSTAT) 0.4 MG SL tablet Place 1 tablet (0.4 mg total) under the tongue every 5 (five) minutes as needed for chest pain.   PRESCRIPTION MEDICATION CPAP- At bedtime and as needed during any time of rest   torsemide (DEMADEX) 20 MG tablet Take 5 tablets (100 mg total) by mouth daily.   TRUE METRIX BLOOD GLUCOSE TEST test strip    TRUEplus Lancets 28G MISC    vitamin B-12 (CYANOCOBALAMIN) 1000 MCG tablet Take 1,000 mcg by mouth daily with lunch.   Vitamin D3 (VITAMIN D) 25 MCG tablet Take 1,000 Units by mouth daily with lunch.   No facility-administered encounter medications on file as of 07/23/2022.    Allergies as of 07/23/2022 - Review Complete 07/23/2022  Allergen Reaction Noted   Dulaglutide Nausea Only, Other (See Comments), and Cough 03/16/2019   Oxycodone Itching and Other (See Comments) 01/30/2021   Trelegy ellipta [fluticasone-umeclidin-vilant] Nausea And Vomiting, Other (See Comments), and Cough 03/18/2021    Past Medical History:  Diagnosis Date   Allergy    Anxiety    Arthritis    CAD (coronary artery disease)    Mild per remote cath in 2006, /    Nuclear, January, 2013, no significant abnormality,   Carotid artery disease ([place])    Doppler, January, 2013, 0 [id number] RIC A., [id number] LICA, stable   Cataract    CHF (congestive heart failure) ([place])    Chronic kidney disease     Complication of anesthesia    wakes up "mean"   COPD 02/16/2007   Intolerant of Spiriva, tudorza Intolerant of Trelegy    COPD (chronic obstructive pulmonary disease) ([place])    Coronary atherosclerosis 11/24/2008   Catheterization 2006, nonobstructive coronary disease   //   nuclear 2013, low risk     Diabetes mellitus, type 2 ([place])    Diverticula of colon    DIVERTICULOSIS OF COLON 03/23/2007   Qualifier: Diagnosis of  By: Halford Chessman MD, [place]     Dizziness    occasional   Dizzy spells 05/04/2011   DM (diabetes mellitus) ([place]) 03/23/2007   Qualifier: Diagnosis of  By: Halford Chessman MD, Vine[place]     Dyspnea    On exertion   Ejection fraction    Essential hypertension 02/16/2007   Qualifier: Diagnosis of  By: Rosana Hoes  CMA, Tammy     G E R D 03/23/2007   Qualifier: Diagnosis of  By: Halford Chessman MD, [place] Fleming bladder disease 04/28/2016   Gastritis and gastroduodenitis    GERD (gastroesophageal reflux disease)    Goiter    hx of   Headache(784.0)    migraine   Hyperlipidemia    Hypertension    Hypothyroidism    HYPOTHYROIDISM, POSTSURGICAL 06/04/2008   Qualifier: Diagnosis of  By: Loanne Drilling MD, Sean A    Left ventricular hypertrophy    Leg edema    occasional swelling   Leg swelling 12/09/2018   Myofascial pain syndrome, cervical 01/06/2018   Obesity    OBESITY 11/24/2008   Qualifier: Diagnosis of  By: Sidney Ace     OBSTRUCTIVE SLEEP APNEA 02/16/2007   Auto CPAP 09/03/13 to 10/02/13 >> used on 30 of 30 nights with average 6 hrs and 3 min.  Average AHI is 3.1 with median CPAP 5 cm H2O and 95 th percentile CPAP 6 cm H2O.     Occipital neuralgia of right side 01/06/2018   OSA (obstructive sleep apnea)    cpap setting of 12   Pain in joint of right hip 01/21/2018   Palpitations 01/21/2011   48 hour Holter, 2015, scattered PACs and PVCs.    Paroxysmal atrial fibrillation (HCC)    Pneumonia    PONV (postoperative nausea and vomiting)    severe after every surgery   Pulmonary hypertension,  secondary    RHINITIS 07/21/2010        RUQ abdominal pain    S/P left TKA 07/27/2011   Sinus node dysfunction (HCC) 11/08/2015   Sleep apnea    Spinal stenosis of cervical region 11/07/2014   Urinary frequency 12/09/2018   UTI (urinary tract infection) 08/15/2019   per pt report   VENTRICULAR HYPERTROPHY, LEFT 11/24/2008   Qualifier: Diagnosis of  By: Sidney Ace      Past Surgical History:  Procedure Laterality Date   ABDOMINAL HYSTERECTOMY     with appendectomy and colon polypectomy   AMPUTATION TOE Left 12/15/2019   Procedure: AMPUTATION  LEFT GREAT TOE;  Surgeon: Evelina Bucy, DPM;  Location: [facility];  Service: Podiatry;  Laterality: Left;   AMPUTATION TOE Left 01/22/2022   Procedure: AMPUTATION TOE 4th digit;  Surgeon: Felipa Furnace, DPM;  Location: [facility];  Service: Podiatry;  Laterality: Left;   ANTERIOR CERVICAL DECOMP/DISCECTOMY FUSION N/A 11/07/2014   Procedure: Anterior Cervical diskectomy and fusion Cervical four-five, Cervical five-six, Cervical six-seven;  Surgeon: Kary Kos, MD;  Location: [facility];  Service: Neurosurgery;  Laterality: N/A;   APPENDECTOMY     ARTERY BIOPSY Right 02/16/2017   Procedure: BIOPSY TEMPORAL ARTERY;  Surgeon: Katha Cabal, MD;  Location: [facility];  Service: Vascular;  Laterality: Right;   ARTERY BIOPSY Left 04/16/2020   Procedure: BIOPSY TEMPORAL ARTERY;  Surgeon: Katha Cabal, MD;  Location: [facility];  Service: Vascular;  Laterality: Left;  LEFT TEMPLE   BACK SURGERY     CARDIAC CATHETERIZATION     CATARACT EXTRACTION W/ INTRAOCULAR LENS  IMPLANT, BILATERAL Bilateral    CHOLECYSTECTOMY N/A 04/29/2016   Procedure: LAPAROSCOPIC CHOLECYSTECTOMY WITH INTRAOPERATIVE CHOLANGIOGRAM;  Surgeon: Alphonsa Overall, MD;  Location: [facility];  Service: General;  Laterality: N/A;   EP IMPLANTABLE DEVICE N/A 11/08/2015   MRI COMPATABLE -- Procedure: Pacemaker Implant;  Surgeon: Deboraha Sprang, MD;  Location: [facility];  Service:  Cardiovascular;  Laterality: N/A;   INCISION AND DRAINAGE ABSCESS Right 05/30/2021   Procedure: IRRIGATION AND DEBRIDEMENT OF PREPATELLA BURSA;  Surgeon: Paralee Cancel, MD;  Location: [facility];  Service: Orthopedics;  Laterality: Right;  60 wants standard knee draping and pulse lavage   INCISION AND DRAINAGE ABSCESS Right 07/22/2021   Procedure: INCISION AND DRAINAGE ABSCESS RIGHT KNEE;  Surgeon: Paralee Cancel, MD;  Location: [facility];  Service: Orthopedics;  Laterality: Right;   INSERT / REPLACE / REMOVE PACEMAKER  2017   Dr. Jens Som [facility])   IR FLUORO GUIDE CV LINE RIGHT  07/25/2021   IR REMOVAL TUN CV CATH W/O FL  09/04/2021   IR US GUIDE VASC ACCESS RIGHT  07/25/2021   JOINT REPLACEMENT     LUMBAR DISC SURGERY  1990's   x 2   THYROIDECTOMY  2011   TOTAL KNEE ARTHROPLASTY  07/27/2011   Procedure: TOTAL KNEE ARTHROPLASTY;  Surgeon: Mauri Pole, MD;  Location: [facility];  Service: Orthopedics;  Laterality: Left;   UPPER ESOPHAGEAL ENDOSCOPIC ULTRASOUND (EUS) N/A 05/14/2016   Procedure: UPPER ESOPHAGEAL ENDOSCOPIC ULTRASOUND (EUS);  Surgeon: Milus Banister, MD;  Location: [facility];  Service: Endoscopy;  Laterality: N/A;  radial only-will be done mod if needed    Family History  Problem Relation Age of Onset   Heart Problems Mother    Heart defect Mother        valve problems   Heart Problems Father    Heart disease Brother    Lung cancer Daughter        non small cell    Diabetes Maternal Aunt    Diabetes Maternal Uncle    Throat cancer Brother    Colon cancer Neg Hx    Colon polyps Neg Hx    Esophageal cancer Neg Hx    Rectal cancer Neg Hx    Stomach cancer Neg Hx     Social History   Socioeconomic History   Marital status: Married    Spouse name: Daisia Slomski   Number of children: 3   Years of education: 12   Highest education level: Not on file  Occupational History   Occupation: retired  Tobacco Use   Smoking status: Former    Packs/day: 2.00    Years: 33.00     Additional pack years: 0.00    Total pack years: 66.00    Types: Cigarettes    Start date: 1966    Quit date: 05/04/1997    Years since quitting: 25.2   Smokeless tobacco: Never  Vaping Use   Vaping Use: Never used  Substance and Sexual Activity   Alcohol use: No   Drug use: No   Sexual activity: Not on file  Other Topics Concern   Not on file  Social History Narrative   Patient lives at home with her husband Jeneen Rinks .   Retired.   Education 12 th grade.   Right handed   Caffeine two cups of coffee.   Social Determinants of Health   Financial Resource Strain: Low Risk  (01/31/2021)   Overall Financial Resource Strain (CARDIA)    Difficulty of Paying Living Expenses: Not very hard  Food Insecurity: No Food Insecurity (01/20/2022)   Hunger Vital Sign    Worried About Running Out of Food in the Last Year: Never true    Ran Out of Food in the Last Year: Never true  Transportation Needs: No Transportation Needs (01/20/2022)   PRAPARE - Transportation    Lack of  Transportation (Medical): No    Lack of Transportation (Non-Medical): No  Physical Activity: Insufficiently Active (09/30/2021)   Exercise Vital Sign    Days of Exercise per Week: 5 days    Minutes of Exercise per Session: 20 min  Stress: No Stress Concern Present (09/30/2021)   [place]    Feeling of Stress : Not at all  Social Connections: [place] (09/30/2021)   Social Connection and Isolation Panel [NHANES]    Frequency of Communication with Friends and Family: More than three times a week    Frequency of Social Gatherings with Friends and Family: Twice a week    Attends Religious Services: 1 to 4 times per year    Active Member of [employer] or Organizations: Yes    Attends Archivist Meetings: 1 to 4 times per year    Marital Status: Married  Human resources officer Violence: Not At Risk (01/20/2022)   Humiliation, Afraid, Rape, and Kick  questionnaire    Fear of Current or Ex-Partner: No    Emotionally Abused: No    Physically Abused: No    Sexually Abused: No    Review of Systems  Respiratory:  Positive for apnea and shortness of breath.   Psychiatric/Behavioral:  Positive for sleep disturbance.     Vitals:   07/23/22 0856  BP: 118/78  Pulse: 71  SpO2: 98%     Physical Exam Constitutional:      Appearance: She is obese.  HENT:     Head: Normocephalic.     Mouth/Throat:     Mouth: Mucous membranes are moist.  Eyes:     General: No scleral icterus.    Pupils: Pupils are equal, round, and reactive to light.  Cardiovascular:     Rate and Rhythm: Normal rate and regular rhythm.     Heart sounds: No murmur heard. Pulmonary:     Effort: No respiratory distress.     Breath sounds: No stridor. No wheezing or rhonchi.  Musculoskeletal:     Cervical back: No rigidity or tenderness.  Neurological:     Mental Status: She is alert.  Psychiatric:        Mood and Affect: Mood normal.      Data Reviewed: Compliance data reviewed showing 100% compliance Average use of 7 hours 52 minutes AutoSet 5-15 Residual AHI 1.5  Spirometry from 2017 reviewed  Echocardiogram 02/11/2022 With ejection fraction of 60 to 65%  Assessment:  Mild obstructive sleep apnea -Continues to benefit from CPAP -Uses CPAP nightly  Chronic respiratory failure -Continue on 3 L of oxygen  Moderate to severe chronic obstructive pulmonary disease -Stable on Breztri -Rescue albuterol use as needed  Plan/Recommendations: Encouraged to continue graded activities as tolerated, she is limited by a lot of pain and discomfort and some imbalance and gait -Does use a walker to get around  [employer]  Continue albuterol  Continue CPAP nightly  Follow-up in about 3 months  Encouraged to call with any significant concerns   Sherrilyn Rist MD [facility] 07/23/2022, 9:22 AM  CC: Hamrick, Lorin Mercy,  MD

## 2022-08-02 DIAGNOSIS — N184 Chronic kidney disease, stage 4 (severe): Secondary | ICD-10-CM | POA: Diagnosis not present

## 2022-08-02 DIAGNOSIS — J449 Chronic obstructive pulmonary disease, unspecified: Secondary | ICD-10-CM | POA: Diagnosis not present

## 2022-08-02 DIAGNOSIS — E1140 Type 2 diabetes mellitus with diabetic neuropathy, unspecified: Secondary | ICD-10-CM | POA: Diagnosis not present

## 2022-08-02 DIAGNOSIS — I5030 Unspecified diastolic (congestive) heart failure: Secondary | ICD-10-CM | POA: Diagnosis not present

## 2022-08-04 DIAGNOSIS — M25569 Pain in unspecified knee: Secondary | ICD-10-CM | POA: Diagnosis not present

## 2022-08-04 DIAGNOSIS — Z1389 Encounter for screening for other disorder: Secondary | ICD-10-CM | POA: Diagnosis not present

## 2022-08-04 DIAGNOSIS — M353 Polymyalgia rheumatica: Secondary | ICD-10-CM | POA: Diagnosis not present

## 2022-08-04 DIAGNOSIS — E349 Endocrine disorder, unspecified: Secondary | ICD-10-CM | POA: Diagnosis not present

## 2022-08-04 DIAGNOSIS — M1710 Unilateral primary osteoarthritis, unspecified knee: Secondary | ICD-10-CM | POA: Diagnosis not present

## 2022-08-04 DIAGNOSIS — R2681 Unsteadiness on feet: Secondary | ICD-10-CM | POA: Diagnosis not present

## 2022-08-04 DIAGNOSIS — M549 Dorsalgia, unspecified: Secondary | ICD-10-CM | POA: Diagnosis not present

## 2022-08-04 DIAGNOSIS — Z79891 Long term (current) use of opiate analgesic: Secondary | ICD-10-CM | POA: Diagnosis not present

## 2022-08-04 DIAGNOSIS — M8580 Other specified disorders of bone density and structure, unspecified site: Secondary | ICD-10-CM | POA: Diagnosis not present

## 2022-08-05 DIAGNOSIS — E1140 Type 2 diabetes mellitus with diabetic neuropathy, unspecified: Secondary | ICD-10-CM | POA: Diagnosis not present

## 2022-08-05 DIAGNOSIS — I5030 Unspecified diastolic (congestive) heart failure: Secondary | ICD-10-CM | POA: Diagnosis not present

## 2022-08-05 DIAGNOSIS — N184 Chronic kidney disease, stage 4 (severe): Secondary | ICD-10-CM | POA: Diagnosis not present

## 2022-08-05 DIAGNOSIS — J449 Chronic obstructive pulmonary disease, unspecified: Secondary | ICD-10-CM | POA: Diagnosis not present

## 2022-08-08 DIAGNOSIS — L03116 Cellulitis of left lower limb: Secondary | ICD-10-CM | POA: Diagnosis not present

## 2022-08-08 DIAGNOSIS — M25562 Pain in left knee: Secondary | ICD-10-CM | POA: Diagnosis not present

## 2022-08-17 DIAGNOSIS — M25562 Pain in left knee: Secondary | ICD-10-CM | POA: Diagnosis not present

## 2022-08-17 DIAGNOSIS — S76112A Strain of left quadriceps muscle, fascia and tendon, initial encounter: Secondary | ICD-10-CM | POA: Diagnosis not present

## 2022-08-19 ENCOUNTER — Ambulatory Visit: Payer: Medicare HMO | Admitting: Podiatry

## 2022-08-19 DIAGNOSIS — S76111A Strain of right quadriceps muscle, fascia and tendon, initial encounter: Secondary | ICD-10-CM | POA: Diagnosis not present

## 2022-08-19 DIAGNOSIS — Z96652 Presence of left artificial knee joint: Secondary | ICD-10-CM | POA: Diagnosis not present

## 2022-08-19 DIAGNOSIS — M25562 Pain in left knee: Secondary | ICD-10-CM | POA: Diagnosis not present

## 2022-08-20 ENCOUNTER — Ambulatory Visit (INDEPENDENT_AMBULATORY_CARE_PROVIDER_SITE_OTHER): Payer: Medicare HMO

## 2022-08-20 ENCOUNTER — Other Ambulatory Visit: Payer: Self-pay | Admitting: Podiatry

## 2022-08-20 ENCOUNTER — Ambulatory Visit: Payer: Medicare HMO | Admitting: Podiatry

## 2022-08-20 DIAGNOSIS — L97522 Non-pressure chronic ulcer of other part of left foot with fat layer exposed: Secondary | ICD-10-CM

## 2022-08-20 DIAGNOSIS — S90121A Contusion of right lesser toe(s) without damage to nail, initial encounter: Secondary | ICD-10-CM | POA: Diagnosis not present

## 2022-08-20 DIAGNOSIS — L539 Erythematous condition, unspecified: Secondary | ICD-10-CM | POA: Diagnosis not present

## 2022-08-20 DIAGNOSIS — M79671 Pain in right foot: Secondary | ICD-10-CM | POA: Diagnosis not present

## 2022-08-20 DIAGNOSIS — L600 Ingrowing nail: Secondary | ICD-10-CM

## 2022-08-20 MED ORDER — DOXYCYCLINE HYCLATE 100 MG PO TABS: 100.0000 mg | ORAL_TABLET | Freq: Two times a day (BID) | ORAL | 0 refills | Status: AC

## 2022-08-20 NOTE — Progress Notes (Signed)
Subjective:  Patient ID: Kayla [name], female    DOB: 06-29-1940,  MRN: [id number]  Chief Complaint  Patient presents with   Nail Problem    82 y.o. female presents for wound care.  Patient presents with right hallux ecchymosis without any history of trauma.  Patient states that it is somewhat painful and wanted to get it evaluated there is some erythema associated with it.  She wants to make sure that there is no infection present.  She is a diabetic and does not want to lose the toe she has secondary complaint left hallux ulceration with fat layer exposed.  She would like to discuss treatment options for that   Review of Systems: Negative except as noted in the HPI. Denies N/V/F/Ch.  Past Medical History:  Diagnosis Date   Allergy    Anxiety    Arthritis    CAD (coronary artery disease)    Mild per remote cath in 2006, /    Nuclear, January, 2013, no significant abnormality,   Carotid artery disease (HCC)    Doppler, January, 2013, 0 39% RIC A., 40-59% LICA, stable   Cataract    CHF (congestive heart failure) (HCC)    Chronic kidney disease    Complication of anesthesia    wakes up "mean"   COPD 02/16/2007   Intolerant of Spiriva, tudorza Intolerant of Trelegy    COPD (chronic obstructive pulmonary disease) (HCC)    Coronary atherosclerosis 11/24/2008   Catheterization 2006, nonobstructive coronary disease   //   nuclear 2013, low risk     Diabetes mellitus, type 2 (HCC)    Diverticula of colon    DIVERTICULOSIS OF COLON 03/23/2007   Qualifier: Diagnosis of  By: Craige Cotta MD, Vineet     Dizziness    occasional   Dizzy spells 05/04/2011   DM (diabetes mellitus) (HCC) 03/23/2007   Qualifier: Diagnosis of  By: Craige Cotta MD, Vineet     Dyspnea    On exertion   Ejection fraction    Essential hypertension 02/16/2007   Qualifier: Diagnosis of  By: Earlene Plater CMA, Tammy     G E R D 03/23/2007   Qualifier: Diagnosis of  By: Craige Cotta MD, Vineet     Gall bladder disease 04/28/2016    Gastritis and gastroduodenitis    GERD (gastroesophageal reflux disease)    Goiter    hx of   Headache(784.0)    migraine   Hyperlipidemia    Hypertension    Hypothyroidism    HYPOTHYROIDISM, POSTSURGICAL 06/04/2008   Qualifier: Diagnosis of  By: Everardo All MD, Sean A    Left ventricular hypertrophy    Leg edema    occasional swelling   Leg swelling 12/09/2018   Myofascial pain syndrome, cervical 01/06/2018   Obesity    OBESITY 11/24/2008   Qualifier: Diagnosis of  By: Vikki Ports     OBSTRUCTIVE SLEEP APNEA 02/16/2007   Auto CPAP 09/03/13 to 10/02/13 >> used on 30 of 30 nights with average 6 hrs and 3 min.  Average AHI is 3.1 with median CPAP 5 cm H2O and 95 th percentile CPAP 6 cm H2O.     Occipital neuralgia of right side 01/06/2018   OSA (obstructive sleep apnea)    cpap setting of 12   Pain in joint of right hip 01/21/2018   Palpitations 01/21/2011   48 hour Holter, 2015, scattered PACs and PVCs.    Paroxysmal atrial fibrillation (HCC)    Pneumonia    PONV (postoperative nausea and  vomiting)    severe after every surgery   Pulmonary hypertension, secondary    RHINITIS 07/21/2010        RUQ abdominal pain    S/P left TKA 07/27/2011   Sinus node dysfunction (HCC) 11/08/2015   Sleep apnea    Spinal stenosis of cervical region 11/07/2014   Urinary frequency 12/09/2018   UTI (urinary tract infection) 08/15/2019   per pt report   VENTRICULAR HYPERTROPHY, LEFT 11/24/2008   Qualifier: Diagnosis of  By: Vikki Ports      Current Outpatient Medications:    doxycycline (VIBRA-TABS) 100 MG tablet, Take 1 tablet (100 mg total) by mouth 2 (two) times daily for 10 days., Disp: 20 tablet, Rfl: 0   acetaminophen (TYLENOL) 500 MG tablet, Take 1,000-1,500 mg by mouth every 8 (eight) hours as needed for mild pain, moderate pain or headache., Disp: , Rfl:    albuterol (PROVENTIL) (2.5 MG/3ML) 0.083% nebulizer solution, Take 3 mLs (2.5 mg total) by nebulization every 2 (two) hours as  needed for wheezing or shortness of breath., Disp: 75 mL, Rfl: 5   albuterol (VENTOLIN HFA) 108 (90 Base) MCG/ACT inhaler, Inhale 2 puffs into the lungs every 6 (six) hours as needed for wheezing or shortness of breath., Disp: 3 each, Rfl: 3   Alcohol Swabs (B-D SINGLE USE SWABS REGULAR) PADS, , Disp: , Rfl:    allopurinol (ZYLOPRIM) 100 MG tablet, Take 100 mg by mouth 2 (two) times daily., Disp: , Rfl:    amiodarone (PACERONE) 200 MG tablet, Take 1 tablet (200 mg total) by mouth daily., Disp: 90 tablet, Rfl: 3   amLODipine (NORVASC) 5 MG tablet, Take 1 tablet (5 mg total) by mouth daily., Disp: 90 tablet, Rfl: 3   ascorbic acid (VITAMIN C) 500 MG tablet, Take 500 mg by mouth daily with lunch., Disp: , Rfl:    atorvastatin (LIPITOR) 40 MG tablet, Take 1 tablet (40 mg total) by mouth daily., Disp: 90 tablet, Rfl: 3   Blood Glucose Calibration (TRUE METRIX LEVEL 1) Low SOLN, , Disp: , Rfl:    Budeson-Glycopyrrol-Formoterol (BREZTRI AEROSPHERE) 160-9-4.8 MCG/ACT AERO, Inhale 2 puffs into the lungs in the morning and at bedtime., Disp: 3 each, Rfl: 10   Budeson-Glycopyrrol-Formoterol (BREZTRI AEROSPHERE) 160-9-4.8 MCG/ACT AERO, Inhale 2 puffs into the lungs in the morning and at bedtime., Disp: 5.9 g, Rfl: 0   Carboxymethylcellulose Sodium (ARTIFICIAL TEARS OP), Place 1 drop into both eyes in the morning and at bedtime., Disp: , Rfl:    chlorthalidone (HYGROTON) 25 MG tablet, Take 25 mg by mouth daily., Disp: , Rfl:    collagenase (SANTYL) 250 UNIT/GM ointment, Apply 1 Application topically daily., Disp: 15 g, Rfl: 0   ELIQUIS 2.5 MG TABS tablet, TAKE 1 TABLET TWICE DAILY, Disp: 180 tablet, Rfl: 3   ferrous sulfate 325 (65 FE) MG tablet, Take 1 tablet (325 mg total) by mouth daily with breakfast., Disp: 90 tablet, Rfl: 3   fluticasone (FLONASE) 50 MCG/ACT nasal spray, USE 2 SPRAYS NASALLY DAILY, Disp: 48 g, Rfl: 1   gabapentin (NEURONTIN) 800 MG tablet, Take 800 mg by mouth See admin instructions. Take  800 mg by mouth three to four times a day, Disp: , Rfl:    HYDROcodone-acetaminophen (NORCO) 10-325 MG tablet, Take 1 tablet by mouth See admin instructions. Take 1 tablet by mouth three to four times a day, Disp: , Rfl:    insulin aspart (NOVOLOG) 100 UNIT/ML FlexPen, Inject 10-17 Units into the skin See admin instructions.  Inject 10-17 units into the skin three times a day with meals "as needed," per SLIDING SCALE, Disp: , Rfl:    insulin glargine (LANTUS) 100 UNIT/ML injection, Inject 0.55 mLs (55 Units total) into the skin 2 (two) times daily. (Patient taking differently: Inject 50-90 Units into the skin 2 (two) times daily after a meal.), Disp: 10 mL, Rfl: 11   levothyroxine (SYNTHROID) 200 MCG tablet, Take 200 mcg by mouth daily before breakfast., Disp: , Rfl:    losartan (COZAAR) 25 MG tablet, Take 25 mg by mouth daily., Disp: , Rfl:    metolazone (ZAROXOLYN) 5 MG tablet, Take one tablet by mouth daily for 3 days 30 minutes before taking your torsemide., Disp: 3 tablet, Rfl: 0   metoprolol succinate (TOPROL-XL) 100 MG 24 hr tablet, TAKE 1 TABLET EVERY DAY WITH OR IMMEDIATELY AFTER A MEAL, Disp: 90 tablet, Rfl: 3   naloxone (NARCAN) nasal spray 4 mg/0.1 mL, Place 4 mg into the nose once as needed for opioid reversal., Disp: , Rfl:    nitroGLYCERIN (NITROSTAT) 0.4 MG SL tablet, Place 1 tablet (0.4 mg total) under the tongue every 5 (five) minutes as needed for chest pain., Disp: 25 tablet, Rfl: 4   PRESCRIPTION MEDICATION, CPAP- At bedtime and as needed during any time of rest, Disp: , Rfl:    torsemide (DEMADEX) 20 MG tablet, Take 5 tablets (100 mg total) by mouth daily., Disp: 135 tablet, Rfl: 3   TRUE METRIX BLOOD GLUCOSE TEST test strip, , Disp: , Rfl:    TRUEplus Lancets 28G MISC, , Disp: , Rfl:    vitamin B-12 (CYANOCOBALAMIN) 1000 MCG tablet, Take 1,000 mcg by mouth daily with lunch., Disp: , Rfl:    Vitamin D3 (VITAMIN D) 25 MCG tablet, Take 1,000 Units by mouth daily with lunch., Disp: ,  Rfl:   Social History   Tobacco Use  Smoking Status Former   Packs/day: 2.00   Years: 33.00   Additional pack years: 0.00   Total pack years: 66.00   Types: Cigarettes   Start date: 1966   Quit date: 05/04/1997   Years since quitting: 25.3  Smokeless Tobacco Never    Allergies  Allergen Reactions   Dulaglutide Nausea Only, Other (See Comments) and Cough    TRULICITY made the patient sick to her stomach (only) several days after using it   Oxycodone Itching and Other (See Comments)    Pt still takes this med even thought it causes itching   Trelegy Ellipta [Fluticasone-Umeclidin-Vilant] Nausea And Vomiting, Other (See Comments) and Cough    Made the patient sick to her stomach (only) several days after using it    Objective:  There were no vitals filed for this visit. There is no height or weight on file to calculate BMI. Constitutional Well developed. Well nourished.  Vascular Dorsalis pedis pulses palpable bilaterally. Posterior tibial pulses palpable bilaterally. Capillary refill normal to all digits.  No cyanosis or clubbing noted. Pedal hair growth normal.  Neurologic Normal speech. Oriented to person, place, and time. Protective sensation absent  Dermatologic Wound Location: Left hallux IPJ ulceration with fat layer exposed no probing down to bone no purulent drainage noted.  No erythema noted Wound Base: Mixed Granular/Fibrotic Peri-wound: Calloused Exudate: Scant/small amount Serosanguinous exudate Wound Measurements: -See below Right hallux ecchymosis noted without any open wounds or lesion.  No purulent drainage noted some erythema noted.  Nail well attached to the underlying nailbed  Orthopedic: No pain to palpation either foot.  Radiographs: None Assessment:   1. Erythema   2. Traumatic ecchymosis of toe of right foot, initial encounter   3. Chronic ulcer of great toe of left foot with fat layer exposed    Plan:  Patient was evaluated and treated and  all questions answered.  Right hallux ecchymosis with underlying erythema -All questions and concerns were discussed with the patient in extensive detail.  The patient may have had a contusion without recall for memory.  At this time I believe patient will benefit from doxycycline to help with erythema.  No concern for nail loss.  No signs of other infection noted.  Ulcer left hallux ulceration with fat layer exposed -Debridement as below. -Dressed with triple antibiotic, DSD. -Continue off-loading with surgical shoe.  Procedure: Excisional Debridement of Wound Tool: Sharp chisel blade/tissue nipper Rationale: Removal of non-viable soft tissue from the wound to promote healing.  Anesthesia: none Pre-Debridement Wound Measurements: 0.5 cm x 0.6 cm x 0.3 cm  Post-Debridement Wound Measurements: 0.6 cm x 0.7 cm x 0.3 cm  Type of Debridement: Sharp Excisional Tissue Removed: Non-viable soft tissue Blood loss: Minimal (<50cc) Depth of Debridement: subcutaneous tissue. Technique: Sharp excisional debridement to bleeding, viable wound base.  Wound Progress: This is my initial evaluation of continue monitor the progression of the wound Site healing conversation 7 Dressing: Dry, sterile, compression dressing. Disposition: Patient tolerated procedure well. Patient to return in 1 week for follow-up.  No follow-ups on file.     Right hallux ecchymosis no history of trauma doxy   Left hallix IPJ ulcer fat layer triple abx

## 2022-08-27 ENCOUNTER — Telehealth: Payer: Self-pay | Admitting: *Deleted

## 2022-08-27 NOTE — Telephone Encounter (Signed)
Patient is calling because her post procedural toe has redness,bleeding, draining,70% pain and swelling, no pus noticed. She is taking antibiotic as prescribed, soaking once daily(encouraged her to start soaking twice daily for 20 minutes each time, will start doing). She is applying ointment and keeping covered with bandage.Is there something else that can be suggested?  She does not wish to come in, barely can walk, please advise.

## 2022-09-01 DIAGNOSIS — M549 Dorsalgia, unspecified: Secondary | ICD-10-CM | POA: Diagnosis not present

## 2022-09-01 DIAGNOSIS — M1710 Unilateral primary osteoarthritis, unspecified knee: Secondary | ICD-10-CM | POA: Diagnosis not present

## 2022-09-01 DIAGNOSIS — E349 Endocrine disorder, unspecified: Secondary | ICD-10-CM | POA: Diagnosis not present

## 2022-09-01 DIAGNOSIS — R2681 Unsteadiness on feet: Secondary | ICD-10-CM | POA: Diagnosis not present

## 2022-09-01 DIAGNOSIS — M8580 Other specified disorders of bone density and structure, unspecified site: Secondary | ICD-10-CM | POA: Diagnosis not present

## 2022-09-01 DIAGNOSIS — Z79891 Long term (current) use of opiate analgesic: Secondary | ICD-10-CM | POA: Diagnosis not present

## 2022-09-01 DIAGNOSIS — M353 Polymyalgia rheumatica: Secondary | ICD-10-CM | POA: Diagnosis not present

## 2022-09-01 DIAGNOSIS — Z1389 Encounter for screening for other disorder: Secondary | ICD-10-CM | POA: Diagnosis not present

## 2022-09-01 DIAGNOSIS — M25569 Pain in unspecified knee: Secondary | ICD-10-CM | POA: Diagnosis not present

## 2022-09-01 NOTE — Progress Notes (Signed)
Remote pacemaker transmission.   

## 2022-09-03 ENCOUNTER — Ambulatory Visit: Payer: Medicare HMO | Admitting: Podiatry

## 2022-09-03 ENCOUNTER — Encounter (HOSPITAL_COMMUNITY): Payer: Self-pay

## 2022-09-03 ENCOUNTER — Inpatient Hospital Stay (HOSPITAL_COMMUNITY)
Admission: EM | Admit: 2022-09-03 | Discharge: 2022-09-15 | DRG: 616 | Disposition: A | Discharge status: Skilled Nursing Facility | Payer: Medicare HMO | Source: Ambulatory Visit | Attending: Internal Medicine | Admitting: Internal Medicine

## 2022-09-03 ENCOUNTER — Emergency Department (INDEPENDENT_AMBULATORY_CARE_PROVIDER_SITE_OTHER): Payer: Medicare HMO

## 2022-09-03 ENCOUNTER — Other Ambulatory Visit: Payer: Self-pay

## 2022-09-03 ENCOUNTER — Emergency Department (HOSPITAL_COMMUNITY): Payer: Medicare HMO

## 2022-09-03 DIAGNOSIS — T827XXA Infection and inflammatory reaction due to other cardiac and vascular devices, implants and grafts, initial encounter: Principal | ICD-10-CM | POA: Diagnosis present

## 2022-09-03 DIAGNOSIS — Z9049 Acquired absence of other specified parts of digestive tract: Secondary | ICD-10-CM

## 2022-09-03 DIAGNOSIS — I330 Acute and subacute infective endocarditis: Secondary | ICD-10-CM | POA: Diagnosis present

## 2022-09-03 DIAGNOSIS — D509 Iron deficiency anemia, unspecified: Secondary | ICD-10-CM | POA: Diagnosis present

## 2022-09-03 DIAGNOSIS — I480 Paroxysmal atrial fibrillation: Secondary | ICD-10-CM | POA: Diagnosis not present

## 2022-09-03 DIAGNOSIS — R600 Localized edema: Secondary | ICD-10-CM

## 2022-09-03 DIAGNOSIS — L97522 Non-pressure chronic ulcer of other part of left foot with fat layer exposed: Secondary | ICD-10-CM | POA: Diagnosis present

## 2022-09-03 DIAGNOSIS — Z79890 Hormone replacement therapy (postmenopausal): Secondary | ICD-10-CM

## 2022-09-03 DIAGNOSIS — L02612 Cutaneous abscess of left foot: Secondary | ICD-10-CM | POA: Diagnosis not present

## 2022-09-03 DIAGNOSIS — Z79899 Other long term (current) drug therapy: Secondary | ICD-10-CM

## 2022-09-03 DIAGNOSIS — Z961 Presence of intraocular lens: Secondary | ICD-10-CM | POA: Diagnosis present

## 2022-09-03 DIAGNOSIS — Z6836 Body mass index (BMI) 36.0-36.9, adult: Secondary | ICD-10-CM

## 2022-09-03 DIAGNOSIS — E1169 Type 2 diabetes mellitus with other specified complication: Principal | ICD-10-CM | POA: Diagnosis present

## 2022-09-03 DIAGNOSIS — D638 Anemia in other chronic diseases classified elsewhere: Secondary | ICD-10-CM | POA: Diagnosis not present

## 2022-09-03 DIAGNOSIS — E669 Obesity, unspecified: Secondary | ICD-10-CM | POA: Diagnosis present

## 2022-09-03 DIAGNOSIS — N184 Chronic kidney disease, stage 4 (severe): Secondary | ICD-10-CM | POA: Diagnosis present

## 2022-09-03 DIAGNOSIS — Z950 Presence of cardiac pacemaker: Secondary | ICD-10-CM

## 2022-09-03 DIAGNOSIS — L02415 Cutaneous abscess of right lower limb: Secondary | ICD-10-CM | POA: Diagnosis present

## 2022-09-03 DIAGNOSIS — I4891 Unspecified atrial fibrillation: Secondary | ICD-10-CM | POA: Diagnosis not present

## 2022-09-03 DIAGNOSIS — E890 Postprocedural hypothyroidism: Secondary | ICD-10-CM | POA: Diagnosis present

## 2022-09-03 DIAGNOSIS — M86072 Acute hematogenous osteomyelitis, left ankle and foot: Secondary | ICD-10-CM | POA: Diagnosis present

## 2022-09-03 DIAGNOSIS — M1990 Unspecified osteoarthritis, unspecified site: Secondary | ICD-10-CM | POA: Diagnosis present

## 2022-09-03 DIAGNOSIS — N179 Acute kidney failure, unspecified: Secondary | ICD-10-CM | POA: Diagnosis not present

## 2022-09-03 DIAGNOSIS — S92414A Nondisplaced fracture of proximal phalanx of right great toe, initial encounter for closed fracture: Secondary | ICD-10-CM | POA: Diagnosis not present

## 2022-09-03 DIAGNOSIS — Z89422 Acquired absence of other left toe(s): Secondary | ICD-10-CM

## 2022-09-03 DIAGNOSIS — I872 Venous insufficiency (chronic) (peripheral): Secondary | ICD-10-CM | POA: Diagnosis present

## 2022-09-03 DIAGNOSIS — J9621 Acute and chronic respiratory failure with hypoxia: Secondary | ICD-10-CM | POA: Diagnosis not present

## 2022-09-03 DIAGNOSIS — G4733 Obstructive sleep apnea (adult) (pediatric): Secondary | ICD-10-CM | POA: Diagnosis present

## 2022-09-03 DIAGNOSIS — I361 Nonrheumatic tricuspid (valve) insufficiency: Secondary | ICD-10-CM | POA: Diagnosis not present

## 2022-09-03 DIAGNOSIS — Z66 Do not resuscitate: Secondary | ICD-10-CM | POA: Diagnosis not present

## 2022-09-03 DIAGNOSIS — M869 Osteomyelitis, unspecified: Secondary | ICD-10-CM

## 2022-09-03 DIAGNOSIS — I495 Sick sinus syndrome: Secondary | ICD-10-CM | POA: Diagnosis not present

## 2022-09-03 DIAGNOSIS — I38 Endocarditis, valve unspecified: Secondary | ICD-10-CM | POA: Diagnosis not present

## 2022-09-03 DIAGNOSIS — I10 Essential (primary) hypertension: Secondary | ICD-10-CM | POA: Diagnosis not present

## 2022-09-03 DIAGNOSIS — I2510 Atherosclerotic heart disease of native coronary artery without angina pectoris: Secondary | ICD-10-CM | POA: Diagnosis present

## 2022-09-03 DIAGNOSIS — M86172 Other acute osteomyelitis, left ankle and foot: Secondary | ICD-10-CM | POA: Diagnosis not present

## 2022-09-03 DIAGNOSIS — E1142 Type 2 diabetes mellitus with diabetic polyneuropathy: Secondary | ICD-10-CM | POA: Diagnosis not present

## 2022-09-03 DIAGNOSIS — L089 Local infection of the skin and subcutaneous tissue, unspecified: Principal | ICD-10-CM | POA: Diagnosis present

## 2022-09-03 DIAGNOSIS — T82897A Other specified complication of cardiac prosthetic devices, implants and grafts, initial encounter: Secondary | ICD-10-CM | POA: Diagnosis not present

## 2022-09-03 DIAGNOSIS — M79662 Pain in left lower leg: Secondary | ICD-10-CM

## 2022-09-03 DIAGNOSIS — F419 Anxiety disorder, unspecified: Secondary | ICD-10-CM | POA: Diagnosis present

## 2022-09-03 DIAGNOSIS — Z713 Dietary counseling and surveillance: Secondary | ICD-10-CM

## 2022-09-03 DIAGNOSIS — E11628 Type 2 diabetes mellitus with other skin complications: Secondary | ICD-10-CM | POA: Diagnosis present

## 2022-09-03 DIAGNOSIS — E039 Hypothyroidism, unspecified: Secondary | ICD-10-CM | POA: Diagnosis not present

## 2022-09-03 DIAGNOSIS — I5033 Acute on chronic diastolic (congestive) heart failure: Secondary | ICD-10-CM | POA: Diagnosis not present

## 2022-09-03 DIAGNOSIS — E1122 Type 2 diabetes mellitus with diabetic chronic kidney disease: Secondary | ICD-10-CM | POA: Diagnosis present

## 2022-09-03 DIAGNOSIS — R0602 Shortness of breath: Secondary | ICD-10-CM | POA: Diagnosis not present

## 2022-09-03 DIAGNOSIS — E1151 Type 2 diabetes mellitus with diabetic peripheral angiopathy without gangrene: Secondary | ICD-10-CM | POA: Diagnosis not present

## 2022-09-03 DIAGNOSIS — M86672 Other chronic osteomyelitis, left ankle and foot: Secondary | ICD-10-CM | POA: Diagnosis not present

## 2022-09-03 DIAGNOSIS — I160 Hypertensive urgency: Secondary | ICD-10-CM | POA: Diagnosis present

## 2022-09-03 DIAGNOSIS — M109 Gout, unspecified: Secondary | ICD-10-CM | POA: Diagnosis present

## 2022-09-03 DIAGNOSIS — Z87891 Personal history of nicotine dependence: Secondary | ICD-10-CM | POA: Diagnosis not present

## 2022-09-03 DIAGNOSIS — Z7901 Long term (current) use of anticoagulants: Secondary | ICD-10-CM

## 2022-09-03 DIAGNOSIS — M79671 Pain in right foot: Secondary | ICD-10-CM | POA: Diagnosis not present

## 2022-09-03 DIAGNOSIS — Y831 Surgical operation with implant of artificial internal device as the cause of abnormal reaction of the patient, or of later complication, without mention of misadventure at the time of the procedure: Secondary | ICD-10-CM | POA: Diagnosis present

## 2022-09-03 DIAGNOSIS — Z981 Arthrodesis status: Secondary | ICD-10-CM

## 2022-09-03 DIAGNOSIS — Z7401 Bed confinement status: Secondary | ICD-10-CM | POA: Diagnosis not present

## 2022-09-03 DIAGNOSIS — I110 Hypertensive heart disease with heart failure: Secondary | ICD-10-CM | POA: Diagnosis not present

## 2022-09-03 DIAGNOSIS — J449 Chronic obstructive pulmonary disease, unspecified: Secondary | ICD-10-CM | POA: Diagnosis present

## 2022-09-03 DIAGNOSIS — M7989 Other specified soft tissue disorders: Secondary | ICD-10-CM | POA: Diagnosis not present

## 2022-09-03 DIAGNOSIS — D62 Acute posthemorrhagic anemia: Secondary | ICD-10-CM | POA: Diagnosis not present

## 2022-09-03 DIAGNOSIS — N189 Chronic kidney disease, unspecified: Secondary | ICD-10-CM | POA: Diagnosis not present

## 2022-09-03 DIAGNOSIS — J90 Pleural effusion, not elsewhere classified: Secondary | ICD-10-CM | POA: Diagnosis not present

## 2022-09-03 DIAGNOSIS — Z8249 Family history of ischemic heart disease and other diseases of the circulatory system: Secondary | ICD-10-CM

## 2022-09-03 DIAGNOSIS — B9562 Methicillin resistant Staphylococcus aureus infection as the cause of diseases classified elsewhere: Secondary | ICD-10-CM | POA: Diagnosis present

## 2022-09-03 DIAGNOSIS — I129 Hypertensive chronic kidney disease with stage 1 through stage 4 chronic kidney disease, or unspecified chronic kidney disease: Secondary | ICD-10-CM | POA: Diagnosis not present

## 2022-09-03 DIAGNOSIS — Z885 Allergy status to narcotic agent status: Secondary | ICD-10-CM

## 2022-09-03 DIAGNOSIS — J811 Chronic pulmonary edema: Secondary | ICD-10-CM | POA: Diagnosis not present

## 2022-09-03 DIAGNOSIS — Z7951 Long term (current) use of inhaled steroids: Secondary | ICD-10-CM

## 2022-09-03 DIAGNOSIS — K219 Gastro-esophageal reflux disease without esophagitis: Secondary | ICD-10-CM | POA: Diagnosis present

## 2022-09-03 DIAGNOSIS — S92512A Displaced fracture of proximal phalanx of left lesser toe(s), initial encounter for closed fracture: Secondary | ICD-10-CM | POA: Diagnosis not present

## 2022-09-03 DIAGNOSIS — E1165 Type 2 diabetes mellitus with hyperglycemia: Secondary | ICD-10-CM | POA: Diagnosis present

## 2022-09-03 DIAGNOSIS — Z90710 Acquired absence of both cervix and uterus: Secondary | ICD-10-CM

## 2022-09-03 DIAGNOSIS — L97529 Non-pressure chronic ulcer of other part of left foot with unspecified severity: Secondary | ICD-10-CM | POA: Diagnosis not present

## 2022-09-03 DIAGNOSIS — T82190A Other mechanical complication of cardiac electrode, initial encounter: Secondary | ICD-10-CM | POA: Diagnosis not present

## 2022-09-03 DIAGNOSIS — R7881 Bacteremia: Secondary | ICD-10-CM | POA: Diagnosis present

## 2022-09-03 DIAGNOSIS — E785 Hyperlipidemia, unspecified: Secondary | ICD-10-CM | POA: Diagnosis present

## 2022-09-03 DIAGNOSIS — J9811 Atelectasis: Secondary | ICD-10-CM | POA: Diagnosis not present

## 2022-09-03 DIAGNOSIS — R001 Bradycardia, unspecified: Secondary | ICD-10-CM

## 2022-09-03 DIAGNOSIS — Z833 Family history of diabetes mellitus: Secondary | ICD-10-CM

## 2022-09-03 DIAGNOSIS — I272 Pulmonary hypertension, unspecified: Secondary | ICD-10-CM | POA: Diagnosis present

## 2022-09-03 DIAGNOSIS — R531 Weakness: Secondary | ICD-10-CM | POA: Diagnosis not present

## 2022-09-03 DIAGNOSIS — Z9981 Dependence on supplemental oxygen: Secondary | ICD-10-CM

## 2022-09-03 DIAGNOSIS — E11621 Type 2 diabetes mellitus with foot ulcer: Secondary | ICD-10-CM | POA: Diagnosis not present

## 2022-09-03 DIAGNOSIS — Z22322 Carrier or suspected carrier of Methicillin resistant Staphylococcus aureus: Secondary | ICD-10-CM

## 2022-09-03 DIAGNOSIS — I509 Heart failure, unspecified: Secondary | ICD-10-CM | POA: Diagnosis not present

## 2022-09-03 DIAGNOSIS — Z7409 Other reduced mobility: Secondary | ICD-10-CM | POA: Diagnosis present

## 2022-09-03 DIAGNOSIS — Z96652 Presence of left artificial knee joint: Secondary | ICD-10-CM | POA: Diagnosis present

## 2022-09-03 DIAGNOSIS — G8929 Other chronic pain: Secondary | ICD-10-CM | POA: Diagnosis present

## 2022-09-03 DIAGNOSIS — M8600 Acute hematogenous osteomyelitis, unspecified site: Secondary | ICD-10-CM | POA: Diagnosis not present

## 2022-09-03 DIAGNOSIS — I130 Hypertensive heart and chronic kidney disease with heart failure and stage 1 through stage 4 chronic kidney disease, or unspecified chronic kidney disease: Secondary | ICD-10-CM | POA: Diagnosis not present

## 2022-09-03 DIAGNOSIS — R296 Repeated falls: Secondary | ICD-10-CM | POA: Diagnosis present

## 2022-09-03 DIAGNOSIS — Z794 Long term (current) use of insulin: Secondary | ICD-10-CM

## 2022-09-03 DIAGNOSIS — E1149 Type 2 diabetes mellitus with other diabetic neurological complication: Secondary | ICD-10-CM | POA: Diagnosis not present

## 2022-09-03 DIAGNOSIS — S62617D Displaced fracture of proximal phalanx of left little finger, subsequent encounter for fracture with routine healing: Secondary | ICD-10-CM | POA: Diagnosis not present

## 2022-09-03 DIAGNOSIS — R234 Changes in skin texture: Secondary | ICD-10-CM | POA: Diagnosis not present

## 2022-09-03 DIAGNOSIS — E119 Type 2 diabetes mellitus without complications: Principal | ICD-10-CM

## 2022-09-03 DIAGNOSIS — Z888 Allergy status to other drugs, medicaments and biological substances status: Secondary | ICD-10-CM

## 2022-09-03 LAB — COMPREHENSIVE METABOLIC PANEL
ALT: 14 U/L (ref 0–44)
AST: 16 U/L (ref 15–41)
Albumin: 2.3 g/dL — ABNORMAL LOW (ref 3.5–5.0)
Alkaline Phosphatase: 64 U/L (ref 38–126)
Anion gap: 14 (ref 5–15)
BUN: 54 mg/dL — ABNORMAL HIGH (ref 8–23)
CO2: 26 mmol/L (ref 22–32)
Calcium: 8.9 mg/dL (ref 8.9–10.3)
Chloride: 97 mmol/L — ABNORMAL LOW (ref 98–111)
Creatinine, Ser: 2.85 mg/dL — ABNORMAL HIGH (ref 0.44–1.00)
GFR, Estimated: 16 mL/min — ABNORMAL LOW (ref >60)
Glucose, Bld: 250 mg/dL — ABNORMAL HIGH (ref 70–99)
Potassium: 4.2 mmol/L (ref 3.5–5.1)
Sodium: 137 mmol/L (ref 135–145)
Total Bilirubin: 0.3 mg/dL (ref 0.3–1.2)
Total Protein: 6.7 g/dL (ref 6.5–8.1)

## 2022-09-03 LAB — CBC WITH DIFFERENTIAL/PLATELET
Abs Immature Granulocytes: 0.13 10*3/uL — ABNORMAL HIGH (ref 0.00–0.07)
Basophils Absolute: 0.1 10*3/uL (ref 0.0–0.1)
Basophils Relative: 1 %
Eosinophils Absolute: 0.2 10*3/uL (ref 0.0–0.5)
Eosinophils Relative: 2 %
HCT: 29.4 % — ABNORMAL LOW (ref 36.0–46.0)
Hemoglobin: 9.1 g/dL — ABNORMAL LOW (ref 12.0–15.0)
Immature Granulocytes: 1 %
Lymphocytes Relative: 15 %
Lymphs Abs: 1.6 10*3/uL (ref 0.7–4.0)
MCH: 30.4 pg (ref 26.0–34.0)
MCHC: 31 g/dL (ref 30.0–36.0)
MCV: 98.3 fL (ref 80.0–100.0)
Monocytes Absolute: 0.8 10*3/uL (ref 0.1–1.0)
Monocytes Relative: 7 %
Neutro Abs: 7.9 10*3/uL — ABNORMAL HIGH (ref 1.7–7.7)
Neutrophils Relative %: 74 %
Platelets: 542 10*3/uL — ABNORMAL HIGH (ref 150–400)
RBC: 2.99 MIL/uL — ABNORMAL LOW (ref 3.87–5.11)
RDW: 14 % (ref 11.5–15.5)
WBC: 10.7 10*3/uL — ABNORMAL HIGH (ref 4.0–10.5)
nRBC: 0 % (ref 0.0–0.2)

## 2022-09-03 LAB — LACTIC ACID, PLASMA: Lactic Acid, Venous: 1.9 mmol/L (ref 0.5–1.9)

## 2022-09-03 MED ORDER — HYDROMORPHONE HCL 1 MG/ML IJ SOLN: 0.5000 mg | INTRAMUSCULAR | Status: AC

## 2022-09-03 MED ORDER — GABAPENTIN 400 MG PO CAPS
800.0000 mg | ORAL_CAPSULE | Freq: Three times a day (TID) | ORAL | Status: DC
  Administered 2022-09-03: 800 mg via ORAL
  Filled 2022-09-03: qty 8

## 2022-09-03 MED ORDER — SODIUM CHLORIDE 0.9 % IV BOLUS: 1000.0000 mL | Freq: Once | INTRAVENOUS | Status: DC

## 2022-09-03 MED ORDER — HYDROCODONE-ACETAMINOPHEN 7.5-325 MG PO TABS
1.0000 | ORAL_TABLET | Freq: Four times a day (QID) | ORAL | Status: DC | PRN
  Administered 2022-09-03 – 2022-09-14 (×25): 1 via ORAL
  Filled 2022-09-03 (×25): qty 1

## 2022-09-03 MED ORDER — VITAMIN D 25 MCG (1000 UNIT) PO TABS
1000.0000 [IU] | ORAL_TABLET | Freq: Every day | ORAL | Status: DC
  Administered 2022-09-05 – 2022-09-15 (×8): 1000 [IU] via ORAL
  Filled 2022-09-03 (×9): qty 1

## 2022-09-03 MED ORDER — ALBUTEROL SULFATE (2.5 MG/3ML) 0.083% IN NEBU
2.5000 mg | INHALATION_SOLUTION | RESPIRATORY_TRACT | Status: DC | PRN
  Administered 2022-09-04 – 2022-09-15 (×54): 2.5 mg via RESPIRATORY_TRACT
  Filled 2022-09-03 (×54): qty 3

## 2022-09-03 MED ORDER — SODIUM CHLORIDE 0.9 % IV SOLN: 2.0000 g | INTRAVENOUS | Status: DC

## 2022-09-03 MED ORDER — ATORVASTATIN CALCIUM 40 MG PO TABS
40.0000 mg | ORAL_TABLET | Freq: Every day | ORAL | Status: DC
  Administered 2022-09-05 – 2022-09-15 (×11): 40 mg via ORAL
  Filled 2022-09-03 (×11): qty 1

## 2022-09-03 MED ORDER — METRONIDAZOLE 500 MG/100ML IV SOLN
500.0000 mg | Freq: Two times a day (BID) | INTRAVENOUS | Status: DC
  Administered 2022-09-04: 500 mg via INTRAVENOUS
  Filled 2022-09-03 (×2): qty 100

## 2022-09-03 MED ORDER — ALBUTEROL SULFATE HFA 108 (90 BASE) MCG/ACT IN AERS: 2.0000 | INHALATION_SPRAY | Freq: Four times a day (QID) | RESPIRATORY_TRACT | Status: DC | PRN

## 2022-09-03 MED ORDER — HYDROCODONE-ACETAMINOPHEN 10-325 MG PO TABS: 1.0000 | ORAL_TABLET | ORAL | Status: DC

## 2022-09-03 MED ORDER — APIXABAN 2.5 MG PO TABS: 2.5000 mg | ORAL_TABLET | Freq: Two times a day (BID) | ORAL | Status: DC

## 2022-09-03 MED ORDER — VITAMIN B-12 1000 MCG PO TABS
1000.0000 ug | ORAL_TABLET | Freq: Every day | ORAL | Status: DC
  Administered 2022-09-05 – 2022-09-15 (×8): 1000 ug via ORAL
  Filled 2022-09-03 (×9): qty 1

## 2022-09-03 MED ORDER — LEVOTHYROXINE SODIUM 100 MCG PO TABS
200.0000 ug | ORAL_TABLET | Freq: Every day | ORAL | Status: DC
  Administered 2022-09-04 – 2022-09-15 (×11): 200 ug via ORAL
  Filled 2022-09-03 (×11): qty 2

## 2022-09-03 NOTE — Progress Notes (Signed)
Lower extremity venous left study completed.  Preliminary results relayed to Zelaya, PA.   See CV Proc for preliminary results report.   Philipp Callegari, RDMS, RVT  

## 2022-09-03 NOTE — ED Notes (Signed)
ED TO INPATIENT HANDOFF REPORT  ED Nurse Name and Phone #: [phone]  S Name/Age/Gender Kayla [name] 82 y.o. female Room/Bed: 032C/032C  Code Status   Code Status: Prior  Home/SNF/Other Home Patient oriented to: self, place, time, and situation Is this baseline? Yes   Triage Complete: Triage complete  Chief Complaint Diabetic foot infection (HCC) [A54.098, L08.9]  Triage Note Pt went to foot Dr this am, wound and pain in L toe,  Hx partial toe amputation; x ray done; pt states she was told she has osteomyelitis; denies fevers; also endorsing dizziness x 1 week; also c/o lump on L calf which pt noted 2 days ago, states she was told by MD it might be a blood clot; pt on eliquis; Hx DM, pacemaker placement, COPD; wear 3L baseline   Allergies Allergies  Allergen Reactions   Dulaglutide Nausea Only, Other (See Comments) and Cough    TRULICITY made the patient sick to her stomach (only) several days after using it   Oxycodone Itching and Other (See Comments)    Pt still takes this med even thought it causes itching   Trelegy Ellipta [Fluticasone-Umeclidin-Vilant] Nausea And Vomiting, Other (See Comments) and Cough    Made the patient sick to her stomach (only) several days after using it     Level of Care/Admitting Diagnosis ED Disposition     ED Disposition  Admit   Condition  --   Comment  Hospital Area: [facility] [[id number]]  Level of Care: Telemetry Surgical [[id number]]  May admit patient to [facility] or [facility] if equivalent level of care is available:: Yes  Covid Evaluation: Asymptomatic - no recent exposure (last 10 days) testing not required  Diagnosis: Diabetic foot infection [facility]) [[id number]]  Admitting Physician: Darlin Drop [[id number]]  Attending Physician: Darlin Drop [[id number]]  Certification:: I certify this patient will need inpatient services for at least 2 midnights  Estimated Length of Stay: 2          B Medical/Surgery  History Past Medical History:  Diagnosis Date   Allergy    Anxiety    Arthritis    CAD (coronary artery disease)    Mild per remote cath in 2006, /    Nuclear, January, 2013, no significant abnormality,   Carotid artery disease (HCC)    Doppler, January, 2013, 0 39% RIC A., 40-59% LICA, stable   Cataract    CHF (congestive heart failure) (HCC)    Chronic kidney disease    Complication of anesthesia    wakes up "mean"   COPD 02/16/2007   Intolerant of Spiriva, tudorza Intolerant of Trelegy    COPD (chronic obstructive pulmonary disease) (HCC)    Coronary atherosclerosis 11/24/2008   Catheterization 2006, nonobstructive coronary disease   //   nuclear 2013, low risk     Diabetes mellitus, type 2 (HCC)    Diverticula of colon    DIVERTICULOSIS OF COLON 03/23/2007   Qualifier: Diagnosis of  By: Craige Cotta MD, Vineet     Dizziness    occasional   Dizzy spells 05/04/2011   DM (diabetes mellitus) (HCC) 03/23/2007   Qualifier: Diagnosis of  By: Craige Cotta MD, Vineet     Dyspnea    On exertion   Ejection fraction    Essential hypertension 02/16/2007   Qualifier: Diagnosis of  By: Earlene Plater CMA, Tammy     G E R D 03/23/2007   Qualifier: Diagnosis of  By: Craige Cotta MD, Vineet  Gall bladder disease 04/28/2016   Gastritis and gastroduodenitis    GERD (gastroesophageal reflux disease)    Goiter    hx of   Headache(784.0)    migraine   Hyperlipidemia    Hypertension    Hypothyroidism    HYPOTHYROIDISM, POSTSURGICAL 06/04/2008   Qualifier: Diagnosis of  By: Everardo All MD, Sean A    Left ventricular hypertrophy    Leg edema    occasional swelling   Leg swelling 12/09/2018   Myofascial pain syndrome, cervical 01/06/2018   Obesity    OBESITY 11/24/2008   Qualifier: Diagnosis of  By: Vikki Ports     OBSTRUCTIVE SLEEP APNEA 02/16/2007   Auto CPAP 09/03/13 to 10/02/13 >> used on 30 of 30 nights with average 6 hrs and 3 min.  Average AHI is 3.1 with median CPAP 5 cm H2O and 95 th percentile CPAP 6 cm  H2O.     Occipital neuralgia of right side 01/06/2018   OSA (obstructive sleep apnea)    cpap setting of 12   Pain in joint of right hip 01/21/2018   Palpitations 01/21/2011   48 hour Holter, 2015, scattered PACs and PVCs.    Paroxysmal atrial fibrillation (HCC)    Pneumonia    PONV (postoperative nausea and vomiting)    severe after every surgery   Pulmonary hypertension, secondary    RHINITIS 07/21/2010        RUQ abdominal pain    S/P left TKA 07/27/2011   Sinus node dysfunction (HCC) 11/08/2015   Sleep apnea    Spinal stenosis of cervical region 11/07/2014   Urinary frequency 12/09/2018   UTI (urinary tract infection) 08/15/2019   per pt report   VENTRICULAR HYPERTROPHY, LEFT 11/24/2008   Qualifier: Diagnosis of  By: Vikki Ports     Past Surgical History:  Procedure Laterality Date   ABDOMINAL HYSTERECTOMY     with appendectomy and colon polypectomy   AMPUTATION TOE Left 12/15/2019   Procedure: AMPUTATION  LEFT GREAT TOE;  Surgeon: Park Liter, DPM;  Location: [facility];  Service: Podiatry;  Laterality: Left;   AMPUTATION TOE Left 01/22/2022   Procedure: AMPUTATION TOE 4th digit;  Surgeon: Candelaria Stagers, DPM;  Location: [facility];  Service: Podiatry;  Laterality: Left;   ANTERIOR CERVICAL DECOMP/DISCECTOMY FUSION N/A 11/07/2014   Procedure: Anterior Cervical diskectomy and fusion Cervical four-five, Cervical five-six, Cervical six-seven;  Surgeon: Donalee Citrin, MD;  Location: [facility];  Service: Neurosurgery;  Laterality: N/A;   APPENDECTOMY     ARTERY BIOPSY Right 02/16/2017   Procedure: BIOPSY TEMPORAL ARTERY;  Surgeon: Renford Dills, MD;  Location: [facility] ORS;  Service: Vascular;  Laterality: Right;   ARTERY BIOPSY Left 04/16/2020   Procedure: BIOPSY TEMPORAL ARTERY;  Surgeon: Renford Dills, MD;  Location: [facility] ORS;  Service: Vascular;  Laterality: Left;  LEFT TEMPLE   BACK SURGERY     CARDIAC CATHETERIZATION     CATARACT EXTRACTION W/ INTRAOCULAR LENS   IMPLANT, BILATERAL Bilateral    CHOLECYSTECTOMY N/A 04/29/2016   Procedure: LAPAROSCOPIC CHOLECYSTECTOMY WITH INTRAOPERATIVE CHOLANGIOGRAM;  Surgeon: Ovidio Kin, MD;  Location: [facility];  Service: General;  Laterality: N/A;   EP IMPLANTABLE DEVICE N/A 11/08/2015   MRI COMPATABLE -- Procedure: Pacemaker Implant;  Surgeon: Duke Salvia, MD;  Location: [facility];  Service: Cardiovascular;  Laterality: N/A;   INCISION AND DRAINAGE ABSCESS Right 05/30/2021   Procedure: IRRIGATION AND DEBRIDEMENT OF PREPATELLA BURSA;  Surgeon: Durene Romans, MD;  Location:  [facility];  Service: Orthopedics;  Laterality: Right;  60 wants standard knee draping and pulse lavage   INCISION AND DRAINAGE ABSCESS Right 07/22/2021   Procedure: INCISION AND DRAINAGE ABSCESS RIGHT KNEE;  Surgeon: Durene Romans, MD;  Location: [facility];  Service: Orthopedics;  Laterality: Right;   INSERT / REPLACE / REMOVE PACEMAKER  2017   Dr. Clide Cliff [facility])   IR FLUORO GUIDE CV LINE RIGHT  07/25/2021   IR REMOVAL TUN CV CATH W/O FL  09/04/2021   IR US GUIDE VASC ACCESS RIGHT  07/25/2021   JOINT REPLACEMENT     LUMBAR DISC SURGERY  1990's   x 2   THYROIDECTOMY  2011   TOTAL KNEE ARTHROPLASTY  07/27/2011   Procedure: TOTAL KNEE ARTHROPLASTY;  Surgeon: Shelda Pal, MD;  Location: [facility];  Service: Orthopedics;  Laterality: Left;   UPPER ESOPHAGEAL ENDOSCOPIC ULTRASOUND (EUS) N/A 05/14/2016   Procedure: UPPER ESOPHAGEAL ENDOSCOPIC ULTRASOUND (EUS);  Surgeon: Rachael Fee, MD;  Location: [facility];  Service: Endoscopy;  Laterality: N/A;  radial only-will be done mod if needed     A IV Location/Drains/Wounds Patient Lines/Drains/Airways Status     Active Line/Drains/Airways     Name Placement date Placement time Site Days   Wound / Incision (Open or Dehisced) 07/22/21 Other (Comment);Skin tear Knee Right Circular 1cm by 1cm 07/22/21  0300  Knee  408   Wound / Incision (Open or Dehisced) 01/20/22 Skin tear Arm Left;Lower;Posterior  01/20/22  1957  Arm  226   Wound / Incision (Open or Dehisced) 01/20/22 Skin tear Leg Left;Posterior;Lower 01/20/22  1957  Leg  226   Wound / Incision (Open or Dehisced) 01/20/22 Skin tear Leg Left;Lower 01/20/22  1957  Leg  226   Wound / Incision (Open or Dehisced) 01/23/22 Skin tear Wrist Posterior;Right 1 inch skin tear occurred while removing tape to remove the occluded peripheral IV lock. Dressing applied. 01/23/22  1507  Wrist  223            Intake/Output Last 24 hours No intake or output data in the 24 hours ending 09/03/22 2332  Labs/Imaging Results for orders placed or performed during the hospital encounter of 09/03/22 (from the past 48 hour(s))  Comprehensive metabolic panel     Status: Abnormal   Collection Time: 09/03/22  5:47 PM  Result Value Ref Range   Sodium 137 135 - 145 mmol/L   Potassium 4.2 3.5 - 5.1 mmol/L   Chloride 97 (L) 98 - 111 mmol/L   CO2 26 22 - 32 mmol/L   Glucose, Bld 250 (H) 70 - 99 mg/dL    Comment: Glucose reference range applies only to samples taken after fasting for at least 8 hours.   BUN 54 (H) 8 - 23 mg/dL   Creatinine, Ser 1.61 (H) 0.44 - 1.00 mg/dL   Calcium 8.9 8.9 - 09.6 mg/dL   Total Protein 6.7 6.5 - 8.1 g/dL   Albumin 2.3 (L) 3.5 - 5.0 g/dL   AST 16 15 - 41 U/L   ALT 14 0 - 44 U/L   Alkaline Phosphatase 64 38 - 126 U/L   Total Bilirubin 0.3 0.3 - 1.2 mg/dL   GFR, Estimated 16 (L) >60 mL/min    Comment: (NOTE) Calculated using the CKD-EPI Creatinine Equation (2021)    Anion gap 14 5 - 15    Comment: Performed at [facility], [address], [place], [place] [address]  CBC with Differential  Status: Abnormal   Collection Time: 09/03/22  5:47 PM  Result Value Ref Range   WBC 10.7 (H) 4.0 - 10.5 K/uL   RBC 2.99 (L) 3.87 - 5.11 MIL/uL   Hemoglobin 9.1 (L) 12.0 - 15.0 g/dL   HCT 16.1 (L) 09.6 - 04.5 %   MCV 98.3 80.0 - 100.0 fL   MCH 30.4 26.0 - 34.0 pg   MCHC 31.0 30.0 - 36.0 g/dL   RDW 40.9 81.1 - 91.4 %    Platelets 542 (H) 150 - 400 K/uL   nRBC 0.0 0.0 - 0.2 %   Neutrophils Relative % 74 %   Neutro Abs 7.9 (H) 1.7 - 7.7 K/uL   Lymphocytes Relative 15 %   Lymphs Abs 1.6 0.7 - 4.0 K/uL   Monocytes Relative 7 %   Monocytes Absolute 0.8 0.1 - 1.0 K/uL   Eosinophils Relative 2 %   Eosinophils Absolute 0.2 0.0 - 0.5 K/uL   Basophils Relative 1 %   Basophils Absolute 0.1 0.0 - 0.1 K/uL   Immature Granulocytes 1 %   Abs Immature Granulocytes 0.13 (H) 0.00 - 0.07 K/uL    Comment: Performed at [facility], [address] [place], [place] [address]  Lactic acid, plasma     Status: None   Collection Time: 09/03/22  5:47 PM  Result Value Ref Range   Lactic Acid, Venous 1.9 0.5 - 1.9 mmol/L    Comment: Performed at [facility], [address] [place], [place] [address]   VAS [place] LOWER EXTREMITY VENOUS (DVT) (7a-7p)  Result Date: 09/03/2022  Lower Venous DVT Study Patient Name:  Kayla [name]  Date of Exam:   09/03/2022 Medical Rec #: [id number]         Accession #:    [id number] Date of Birth: [birth year]        Patient Gender: F Patient Age:   68 years Exam Location:  [facility] Procedure:      VAS [place] LOWER EXTREMITY VENOUS (DVT) Referring Phys: Maryanna Shape --------------------------------------------------------------------------------  Indications: Palpable varicosities, erythema in setting of concern for toe infection.  Anticoagulation: Eliquis. Limitations: Poor ultrasound/tissue interface and patient pain at calf. Comparison Study: No prior studies. Performing Technologist: Jean Rosenthal RDMS, RVT  Examination Guidelines: A complete evaluation includes B-mode imaging, spectral Doppler, color Doppler, and power Doppler as needed of all accessible portions of each vessel. Bilateral testing is considered an integral part of a complete examination. Limited examinations for reoccurring indications may be performed as noted. The reflux portion of the exam is performed with the patient  in reverse Trendelenburg.  +-----+---------------+---------+-----------+----------+--------------+ RIGHTCompressibilityPhasicitySpontaneityPropertiesThrombus Aging +-----+---------------+---------+-----------+----------+--------------+ CFV  Full           Yes      Yes                                 +-----+---------------+---------+-----------+----------+--------------+   +---------+---------------+---------+-----------+----------+-------------------+ LEFT     CompressibilityPhasicitySpontaneityPropertiesThrombus Aging      +---------+---------------+---------+-----------+----------+-------------------+ CFV      Full           Yes      Yes                                      +---------+---------------+---------+-----------+----------+-------------------+ SFJ      Full                                                             +---------+---------------+---------+-----------+----------+-------------------+  FV Prox  Full                                                             +---------+---------------+---------+-----------+----------+-------------------+ FV Mid   Full                                                             +---------+---------------+---------+-----------+----------+-------------------+ FV DistalFull                                                             +---------+---------------+---------+-----------+----------+-------------------+ PFV      Full                                                             +---------+---------------+---------+-----------+----------+-------------------+ POP      Full           Yes      Yes                                      +---------+---------------+---------+-----------+----------+-------------------+ PTV      Full                                                             +---------+---------------+---------+-----------+----------+-------------------+ PERO                                                   Not well visualized +---------+---------------+---------+-----------+----------+-------------------+ GSV      Full                                                             +---------+---------------+---------+-----------+----------+-------------------+    Summary: RIGHT: - No evidence of common femoral vein obstruction.  LEFT: - There is no evidence of deep vein thrombosis in the lower extremity. However, portions of this examination were limited- see technologist comments above.  - No cystic structure found in the popliteal fossa.  *See table(s) above for measurements and observations.    Preliminary    DG Foot Complete Left  Result Date: 09/03/2022 Please see detailed radiograph report in office note.   Pending Labs Unresulted  Labs (From admission, onward)     Start     Ordered   09/03/22 1736  Lactic acid, plasma  Now then every 2 hours,   R      09/03/22 1735   09/03/22 1736  Blood culture (routine x 2)  BLOOD CULTURE X 2,   R     Question Answer Comment  Patient immune status Normal   Release to patient Immediate      09/03/22 1735            Vitals/Pain Today's Vitals   09/03/22 1654 09/03/22 2158 09/03/22 2249  BP: (!) 121/48 124/80 (!) 141/55  Pulse: 65 70 62  Resp: 18    Temp: 99.6 F (37.6 C) 98 F (36.7 C)   TempSrc: Oral    SpO2: 100% 100% 100%    Isolation Precautions No active isolations  Medications Medications  cefTRIAXone (ROCEPHIN) 2 g in sodium chloride 0.9 % 100 mL IVPB (has no administration in time range)    And  metroNIDAZOLE (FLAGYL) IVPB 500 mg (has no administration in time range)  sodium chloride 0.9 % bolus 1,000 mL (has no administration in time range)  apixaban (ELIQUIS) tablet 2.5 mg (has no administration in time range)  gabapentin (NEURONTIN) capsule 800 mg (has no administration in time range)  HYDROcodone-acetaminophen (NORCO) 7.5-325 MG per tablet 1 tablet (has no administration in  time range)    Mobility manual wheelchair     Focused Assessments     R Recommendations: See Admitting Provider Note  Report given to:   Additional Notes:

## 2022-09-03 NOTE — ED Notes (Signed)
Ultrasound at bedside

## 2022-09-03 NOTE — ED Provider Notes (Signed)
[place] EMERGENCY DEPARTMENT AT [facility] Provider Note   CSN: [id number] Arrival date & time: 09/03/22  1646     History  Chief Complaint  Patient presents with   Foot Pain   Dizziness    Kayla [name] is a 82 y.o. female.  Patient with history significant for COPD and chronic hypoxemia on 3 L oxygen at home, obstructive sleep apnea on CPAP at night, sick sinus syndrome status post pacemaker, A-fib on amiodarone and metoprolol, therapeutic on Eliquis, insulin-dependent type 2 diabetes, chronic venous stasis pigmentation and lymphedema, h/o osteomyelitis of left fourth digit and left great toe requiring amputation --presents to the emergency department today for evaluation left great toe swelling and drainage.  She initially had symptoms and was seen by her podiatrist on 08/20/2022.  She had some debridement done and was started on doxycycline.  Patient has completed 2 courses of this antibiotic.  She has had increasing swelling and drainage from the ulceration on the base of the toe.  She had a follow-up appointment with podiatry today.  She had an x-ray which was suggestive of osteomyelitis of the great toe.  She also had left lower extremity swelling and foot swelling and was sent to rule out DVT.  They recommended admission to the hospital for IV antibiotics and MRI of the foot to further evaluate for osteomyelitis.  Patient denies fevers.  No current chest pain, shortness of breath, nausea, vomiting, diarrhea.       Home Medications Prior to Admission medications   Medication Sig Start Date End Date Taking? Authorizing Provider  acetaminophen (TYLENOL) 500 MG tablet Take 1,000-1,500 mg by mouth every 8 (eight) hours as needed for mild pain, moderate pain or headache.    [provider]  albuterol (PROVENTIL) (2.5 MG/3ML) 0.083% nebulizer solution Take 3 mLs (2.5 mg total) by nebulization every 2 (two) hours as needed for wheezing or shortness of breath.  07/03/22   Parrett, Virgel Bouquet, NP  albuterol (VENTOLIN HFA) 108 (90 Base) MCG/ACT inhaler Inhale 2 puffs into the lungs every 6 (six) hours as needed for wheezing or shortness of breath. 07/03/22   Parrett, Virgel Bouquet, NP  Alcohol Swabs (B-D SINGLE USE SWABS REGULAR) PADS  11/24/19   [provider]  allopurinol (ZYLOPRIM) 100 MG tablet Take 100 mg by mouth 2 (two) times daily. 04/16/19   [provider]  amiodarone (PACERONE) 200 MG tablet Take 1 tablet (200 mg total) by mouth daily. 05/22/22   Jake Bathe, MD  amLODipine (NORVASC) 5 MG tablet Take 1 tablet (5 mg total) by mouth daily. 05/28/22   Jake Bathe, MD  ascorbic acid (VITAMIN C) 500 MG tablet Take 500 mg by mouth daily with lunch.    [provider]  atorvastatin (LIPITOR) 40 MG tablet Take 1 tablet (40 mg total) by mouth daily. 05/22/22   Jake Bathe, MD  Blood Glucose Calibration (TRUE METRIX LEVEL 1) Low SOLN  11/27/19   [provider]  Budeson-Glycopyrrol-Formoterol (BREZTRI AEROSPHERE) 160-9-4.8 MCG/ACT AERO Inhale 2 puffs into the lungs in the morning and at bedtime. 07/03/22   Parrett, Virgel Bouquet, NP  Budeson-Glycopyrrol-Formoterol (BREZTRI AEROSPHERE) 160-9-4.8 MCG/ACT AERO Inhale 2 puffs into the lungs in the morning and at bedtime. 07/03/22   Parrett, Virgel Bouquet, NP  Carboxymethylcellulose Sodium (ARTIFICIAL TEARS OP) Place 1 drop into both eyes in the morning and at bedtime.    [provider]  chlorthalidone (HYGROTON) 25 MG tablet Take 25 mg  by mouth daily. 03/25/22   [provider]  collagenase (SANTYL) 250 UNIT/GM ointment Apply 1 Application topically daily. 02/27/22   Candelaria Stagers, DPM  ELIQUIS 2.5 MG TABS tablet TAKE 1 TABLET TWICE DAILY 04/20/22   Jake Bathe, MD  ferrous sulfate 325 (65 FE) MG tablet Take 1 tablet (325 mg total) by mouth daily with breakfast. 01/26/20   Duke Salvia, MD  fluticasone [facility]) 50 MCG/ACT nasal spray USE 2 SPRAYS NASALLY DAILY 02/26/17    Leslye Peer, MD  gabapentin (NEURONTIN) 800 MG tablet Take 800 mg by mouth See admin instructions. Take 800 mg by mouth three to four times a day    [provider]  HYDROcodone-acetaminophen (NORCO) 10-325 MG tablet Take 1 tablet by mouth See admin instructions. Take 1 tablet by mouth three to four times a day    [provider]  insulin aspart (NOVOLOG) 100 UNIT/ML FlexPen Inject 10-17 Units into the skin See admin instructions. Inject 10-17 units into the skin three times a day with meals "as needed," per [facility] SCALE    [provider]  insulin glargine (LANTUS) 100 UNIT/ML injection Inject 0.55 mLs (55 Units total) into the skin 2 (two) times daily. Patient taking differently: Inject 50-90 Units into the skin 2 (two) times daily after a meal. 07/27/21   Regalado, Belkys A, MD  levothyroxine (SYNTHROID) 200 MCG tablet Take 200 mcg by mouth daily before breakfast.    [provider]  losartan (COZAAR) 25 MG tablet Take 25 mg by mouth daily. 03/07/22   [provider]  metolazone (ZAROXOLYN) 5 MG tablet Take one tablet by mouth daily for 3 days 30 minutes before taking your torsemide. 02/09/22   Sharlene Dory, PA-C  metoprolol succinate (TOPROL-XL) 100 MG 24 hr tablet TAKE 1 TABLET EVERY DAY WITH OR IMMEDIATELY AFTER A MEAL 03/09/22   Jake Bathe, MD  naloxone [facility]) nasal spray 4 mg/0.1 mL Place 4 mg into the nose once as needed for opioid reversal. 12/04/20   [provider]  nitroGLYCERIN (NITROSTAT) 0.4 MG SL tablet Place 1 tablet (0.4 mg total) under the tongue every 5 (five) minutes as needed for chest pain. 04/13/19   Jake Bathe, MD  PRESCRIPTION MEDICATION CPAP- At bedtime and as needed during any time of rest    [provider]  torsemide (DEMADEX) 20 MG tablet Take 5 tablets (100 mg total) by mouth daily. 01/25/22   Kathlen Mody, MD  TRUE METRIX BLOOD GLUCOSE TEST test strip  09/18/17   [provider]   TRUEplus Lancets 28G MISC  11/24/19   [provider]  vitamin B-12 (CYANOCOBALAMIN) 1000 MCG tablet Take 1,000 mcg by mouth daily with lunch.    [provider]  Vitamin D3 (VITAMIN D) 25 MCG tablet Take 1,000 Units by mouth daily with lunch.    [provider]      Allergies    Dulaglutide, Oxycodone, and Trelegy ellipta [fluticasone-umeclidin-vilant]    Review of Systems   Review of Systems  Physical Exam Updated Vital Signs BP 124/80 (BP Location: Right Arm)   Pulse 70   Temp 98 F (36.7 C)   Resp 18   SpO2 100%   Physical Exam Vitals and nursing note reviewed.  Constitutional:      General: She is not in acute distress.    Appearance: She is well-developed.  HENT:     Head: Normocephalic and atraumatic.     Right  Ear: External ear normal.     Left Ear: External ear normal.     Nose: Nose normal.  Eyes:     Conjunctiva/sclera: Conjunctivae normal.  Cardiovascular:     Rate and Rhythm: Normal rate.     Heart sounds: No murmur heard. Pulmonary:     Effort: No respiratory distress.     Breath sounds: No wheezing, rhonchi or rales.  Abdominal:     Palpations: Abdomen is soft.     Tenderness: There is no abdominal tenderness. There is no guarding or rebound.  Musculoskeletal:     Cervical back: Normal range of motion and neck supple.     Right lower leg: No edema.     Left lower leg: No edema.     Comments: Patient with previous left fourth toe amputation and partial great toe amputation.  There is swelling and erythema with crusting drainage of the great toe with a nickel sized ulceration with exposed fatty tissue to the plantar aspect of the great toe.  There is 1+ pitting edema of the foot onto the ankle.  Skin:    General: Skin is warm and dry.     Findings: No rash.  Neurological:     General: No focal deficit present.     Mental Status: She is alert. Mental status is at baseline.     Motor: No weakness.  Psychiatric:        Mood  and Affect: Mood normal.         ED Results / Procedures / Treatments   Labs (all labs ordered are listed, but only abnormal results are displayed) Labs Reviewed  COMPREHENSIVE METABOLIC PANEL - Abnormal; Notable for the following components:      Result Value   Chloride 97 (*)    Glucose, Bld 250 (*)    BUN 54 (*)    Creatinine, Ser 2.85 (*)    Albumin 2.3 (*)    GFR, Estimated 16 (*)    All other components within normal limits  CBC WITH DIFFERENTIAL/PLATELET - Abnormal; Notable for the following components:   WBC 10.7 (*)    RBC 2.99 (*)    Hemoglobin 9.1 (*)    HCT 29.4 (*)    Platelets 542 (*)    Neutro Abs 7.9 (*)    Abs Immature Granulocytes 0.13 (*)    All other components within normal limits  CULTURE, BLOOD (ROUTINE X 2)  CULTURE, BLOOD (ROUTINE X 2)  LACTIC ACID, PLASMA  LACTIC ACID, PLASMA    EKG EKG Interpretation  Date/Time:  Thursday Sep 03 2022 17:08:16 EDT Ventricular Rate:  67 PR Interval:  218 QRS Duration: 86 QT Interval:  462 QTC Calculation: 488 R Axis:   27 Text Interpretation: Atrial-paced rhythm with prolonged AV conduction Nonspecific ST and T wave abnormality Abnormal ECG No significant change since last tracing Confirmed by Melene Plan [phone]) on 09/03/2022 9:56:59 PM  Radiology VAS [place] LOWER EXTREMITY VENOUS (DVT) (7a-7p)  Result Date: 09/03/2022  Lower Venous DVT Study Patient Name:  IDALI LAFEVER  Date of Exam:   09/03/2022 Medical Rec #: [id number]         Accession #:    [id number] Date of Birth: Apr 10, 1941        Patient Gender: F Patient Age:   58 years Exam Location:  [facility] Procedure:      VAS [place] LOWER EXTREMITY VENOUS (DVT) Referring Phys: Maryanna Shape --------------------------------------------------------------------------------  Indications: Palpable varicosities, erythema in setting of  concern for toe infection.  Anticoagulation: Eliquis. Limitations: Poor ultrasound/tissue interface and patient pain at calf.  Comparison Study: No prior studies. Performing Technologist: Jean Rosenthal RDMS, RVT  Examination Guidelines: A complete evaluation includes B-mode imaging, spectral Doppler, color Doppler, and power Doppler as needed of all accessible portions of each vessel. Bilateral testing is considered an integral part of a complete examination. Limited examinations for reoccurring indications may be performed as noted. The reflux portion of the exam is performed with the patient in reverse Trendelenburg.  +-----+---------------+---------+-----------+----------+--------------+ RIGHTCompressibilityPhasicitySpontaneityPropertiesThrombus Aging +-----+---------------+---------+-----------+----------+--------------+ CFV  Full           Yes      Yes                                 +-----+---------------+---------+-----------+----------+--------------+   +---------+---------------+---------+-----------+----------+-------------------+ LEFT     CompressibilityPhasicitySpontaneityPropertiesThrombus Aging      +---------+---------------+---------+-----------+----------+-------------------+ CFV      Full           Yes      Yes                                      +---------+---------------+---------+-----------+----------+-------------------+ SFJ      Full                                                             +---------+---------------+---------+-----------+----------+-------------------+ FV Prox  Full                                                             +---------+---------------+---------+-----------+----------+-------------------+ FV Mid   Full                                                             +---------+---------------+---------+-----------+----------+-------------------+ FV DistalFull                                                             +---------+---------------+---------+-----------+----------+-------------------+ PFV      Full                                                              +---------+---------------+---------+-----------+----------+-------------------+ POP      Full           Yes      Yes                                      +---------+---------------+---------+-----------+----------+-------------------+  PTV      Full                                                             +---------+---------------+---------+-----------+----------+-------------------+ PERO                                                  Not well visualized +---------+---------------+---------+-----------+----------+-------------------+ GSV      Full                                                             +---------+---------------+---------+-----------+----------+-------------------+    Summary: RIGHT: - No evidence of common femoral vein obstruction.  LEFT: - There is no evidence of deep vein thrombosis in the lower extremity. However, portions of this examination were limited- see technologist comments above.  - No cystic structure found in the popliteal fossa.  *See table(s) above for measurements and observations.    Preliminary    DG Foot Complete Left  Result Date: 09/03/2022 Please see detailed radiograph report in office note.   Procedures Procedures    Medications Ordered in ED Medications  cefTRIAXone (ROCEPHIN) 2 g in sodium chloride 0.9 % 100 mL IVPB (has no administration in time range)    And  metroNIDAZOLE (FLAGYL) IVPB 500 mg (has no administration in time range)  sodium chloride 0.9 % bolus 1,000 mL (has no administration in time range)    ED Course/ Medical Decision Making/ A&P   Patient seen and examined. History obtained directly from patient.  Reviewed previous hospitalization notes and recent outpatient podiatry clinic notes.  Labs/EKG: Reviewed and interpreted Labs ordered in triage including CBC with mildly elevated white blood cell count, hemoglobin 9.1, absolute neutrophil count 7.9; CMP  with glucose 250 with normal anion gap, creatinine of 2.85 (1.9-2.1) and BUN of 54 greater than recent baseline; lactate was normal at 1.9.  Imaging: Reviewed x-ray from podiatry today, ordered MRI of the foot  Medications/Fluids: Ordered: IV Rocephin and IV Flagyl per order set.  Most recent vital signs reviewed and are as follows: BP 124/80 (BP Location: Right Arm)   Pulse 70   Temp 98 F (36.7 C)   Resp 18   SpO2 100%   Initial impression: Concern for great toe soft tissue infection and osteomyelitis, DVT study was negative, also acute on chronic kidney injury.  11:04 PM I consulted with Dr. Margo Aye with [facility] hospital[facility] who will see patient for admission.                             Medical Decision Making Amount and/or Complexity of Data Reviewed Radiology: ordered.  Risk Prescription drug management.   Patient with diabetic toe infection, worsening despite IV antibiotics and now concern for osteomyelitis.  No signs of severe sepsis at this time.  Patient be started on IV medications and admitted for further evaluation.  Her foot does not appear ischemic.  She  had ABIs performed about a month ago, but could not be determined on the left side due to incompressible arteries.        Final Clinical Impression(s) / ED Diagnoses Final diagnoses:  Diabetic foot infection (HCC)  Acute renal failure superimposed on chronic kidney disease, unspecified acute renal failure type, unspecified CKD stage [facility])    Rx / DC Orders ED Discharge Orders     None         Renne Crigler, PA-C 09/03/22 2305    Melene Plan, DO 09/03/22 2309

## 2022-09-03 NOTE — ED Provider Triage Note (Signed)
Emergency Medicine Provider Triage Evaluation Note  Kayla [name] , a 82 y.o. female  was evaluated in triage.  Pt complains of foot pain.  Patient was seen by podiatrist earlier today and was advised to come to the emergency department for evaluation of possible osteomyelitis based on x-ray imaging.  Also advised to come in for ultrasound of left lower extremity due to a bulge present patient's leg that podiatrist is worried about possible DVT.  Patient is on Eliquis.  Denies any chest pain, shortness of breath, fevers, abdominal pain, nausea, vomiting.  Review of Systems  Positive: As above Negative: As above  Physical Exam  BP (!) 121/48 (BP Location: Left Arm)   Pulse 65   Temp 99.6 F (37.6 C) (Oral)   Resp 18   SpO2 100%  Gen:   Awake, no distress   Resp:  Normal effort  MSK:   Moves extremities without difficulty  Other:  Firm bump noted on the medial aspect of the left calf  Medical Decision Making  Medically screening exam initiated at 5:36 PM.  Appropriate orders placed.  Kayla [name] was informed that the remainder of the evaluation will be completed by another provider, this initial triage assessment does not replace that evaluation, and the importance of remaining in the ED until their evaluation is complete.     Kayla Knudsen, PA-C 09/03/22 1736

## 2022-09-03 NOTE — Progress Notes (Signed)
Subjective:  Patient ID: Kayla [name], female    DOB: [birth year],  MRN: [id number]  Chief Complaint  Patient presents with   Foot Pain    Patient is diabetic. [id number].     82 y.o. female presents with with concern for ulceration and possible drainage from the left great toe.  She has history of prior partial amputation of the toe with removal of the distal phalanx.  This was done by Dr. Samuella Cota a couple years ago.  She has noted she has had some skin trimmed down recently by Dr. Allena Katz from the left great toe and since then there has been some increasing drainage possibly purulence as well as redness and swelling of the left great toe.  She has been taking antibiotics.  Past Medical History:  Diagnosis Date   Allergy    Anxiety    Arthritis    CAD (coronary artery disease)    Mild per remote cath in 2006, /    Nuclear, January, 2013, no significant abnormality,   Carotid artery disease (HCC)    Doppler, January, 2013, 0 39% RIC A., 40-59% LICA, stable   Cataract    CHF (congestive heart failure) (HCC)    Chronic kidney disease    Complication of anesthesia    wakes up "mean"   COPD 02/16/2007   Intolerant of Spiriva, tudorza Intolerant of Trelegy    COPD (chronic obstructive pulmonary disease) (HCC)    Coronary atherosclerosis 11/24/2008   Catheterization 2006, nonobstructive coronary disease   //   nuclear 2013, low risk     Diabetes mellitus, type 2 (HCC)    Diverticula of colon    DIVERTICULOSIS OF COLON 03/23/2007   Qualifier: Diagnosis of  By: Craige Cotta MD, Vineet     Dizziness    occasional   Dizzy spells 05/04/2011   DM (diabetes mellitus) (HCC) 03/23/2007   Qualifier: Diagnosis of  By: Craige Cotta MD, Vineet     Dyspnea    On exertion   Ejection fraction    Essential hypertension 02/16/2007   Qualifier: Diagnosis of  By: Earlene Plater CMA, Tammy     G E R D 03/23/2007   Qualifier: Diagnosis of  By: Craige Cotta MD, Vineet     Gall bladder disease 04/28/2016   Gastritis and  gastroduodenitis    GERD (gastroesophageal reflux disease)    Goiter    hx of   Headache(784.0)    migraine   Hyperlipidemia    Hypertension    Hypothyroidism    HYPOTHYROIDISM, POSTSURGICAL 06/04/2008   Qualifier: Diagnosis of  By: Everardo All MD, Sean A    Left ventricular hypertrophy    Leg edema    occasional swelling   Leg swelling 12/09/2018   Myofascial pain syndrome, cervical 01/06/2018   Obesity    OBESITY 11/24/2008   Qualifier: Diagnosis of  By: Vikki Ports     OBSTRUCTIVE SLEEP APNEA 02/16/2007   Auto CPAP 09/03/13 to 10/02/13 >> used on 30 of 30 nights with average 6 hrs and 3 min.  Average AHI is 3.1 with median CPAP 5 cm H2O and 95 th percentile CPAP 6 cm H2O.     Occipital neuralgia of right side 01/06/2018   OSA (obstructive sleep apnea)    cpap setting of 12   Pain in joint of right hip 01/21/2018   Palpitations 01/21/2011   48 hour Holter, 2015, scattered PACs and PVCs.    Paroxysmal atrial fibrillation (HCC)    Pneumonia    PONV (postoperative nausea  and vomiting)    severe after every surgery   Pulmonary hypertension, secondary    RHINITIS 07/21/2010        RUQ abdominal pain    S/P left TKA 07/27/2011   Sinus node dysfunction (HCC) 11/08/2015   Sleep apnea    Spinal stenosis of cervical region 11/07/2014   Urinary frequency 12/09/2018   UTI (urinary tract infection) 08/15/2019   per pt report   VENTRICULAR HYPERTROPHY, LEFT 11/24/2008   Qualifier: Diagnosis of  By: Vikki Ports      Allergies  Allergen Reactions   Dulaglutide Nausea Only, Other (See Comments) and Cough    TRULICITY made the patient sick to her stomach (only) several days after using it   Oxycodone Itching and Other (See Comments)    Pt still takes this med even thought it causes itching   Trelegy Ellipta [Fluticasone-Umeclidin-Vilant] Nausea And Vomiting, Other (See Comments) and Cough    Made the patient sick to her stomach (only) several days after using it     ROS:  Negative except as per HPI above  Objective:  General: AAO x3, NAD  Dermatological: Left hallux erythema and edema there is necrotic tissue present on the distal plantar and dorsal aspect of the toe with significant edema.  Possible crepitus noted of the distal tuft of the toe.  There is ulceration present with fibrotic and necrotic tissue present.   Vascular:  Dorsalis Pedis artery and Posterior Tibial artery pedal pulses are 1/4 bilateral.  Capillary fill time < 3 sec to all digits.   Neruologic: Grossly absent to light touch  Musculoskeletal: Left foot with prior amputation of the fourth toe.  Attention to the left hallux is noted to be erythema and edema of the toe.  There is also swelling of the left calf medially as well as some pain with palpation of the area of swelling  Gait: Unassisted, Nonantalgic.   No images are attached to the encounter.  Radiographs:  Date: 09/03/2022 XR the left foot Weightbearing AP/Lateral/Oblique   Findings: Attention directed to the proximal phalanx there is noted to be possible pathologic fracture through the middle portion of the proximal phalanx with osteolysis noted as well as erosions consistent with possible osteomyelitis.  There is no obvious soft tissue emphysema however there is some changes to the distal tuft of the toe possibly corresponding with wound. Assessment:   1. Chronic ulcer of great toe of left foot with fat layer exposed (HCC)   2. Osteomyelitis of great toe of left foot (HCC)   3. History of amputation of lesser toe of left foot (HCC)   4. Diabetic peripheral neuropathy associated with type 2 diabetes mellitus (HCC)   5. Venous (peripheral) insufficiency   6. Localized edema      Plan:  Patient was evaluated and treated and all questions answered.  # Ulceration of the left hallux with concern for soft tissue infection and likely osteomyelitis of the proximal phalanx based on x-ray imaging -Discussed with patient that she  does have evidence of possible osteomyelitis in the left hallux.  Coupled with the fact that she has significant edema and erythema and drainage from the toe I recommend admission to the hospital with further evaluation to include MRI -Recommend MRI without contrast of the left foot to evaluate for osteomyelitis in the left hallux -Discussed with the patient that she is facing further and complete amputation of the left hallux given the above findings -Recommend antibiotics including IV antibiotic therapy -Will  discuss above with Dr. Ardelle Anton who will see the patient in consultation  # Left calf swelling and pain -Recommend ultrasound evaluation to rule out DVT of the left leg -Discussed the importance of checking for DVT despite her history of Eliquis use          Corinna Gab, DPM [facility]

## 2022-09-03 NOTE — ED Triage Notes (Addendum)
Pt went to foot Dr this am, wound and pain in L toe,  Hx partial toe amputation; x ray done; pt states she was told she has osteomyelitis; denies fevers; also endorsing dizziness x 1 week; also c/o lump on L calf which pt noted 2 days ago, states she was told by MD it might be a blood clot; pt on eliquis; Hx DM, pacemaker placement, COPD; wear 3L baseline

## 2022-09-04 ENCOUNTER — Inpatient Hospital Stay (HOSPITAL_COMMUNITY): Payer: Medicare HMO

## 2022-09-04 ENCOUNTER — Inpatient Hospital Stay (HOSPITAL_COMMUNITY): Payer: Medicare HMO | Admitting: Certified Registered"

## 2022-09-04 ENCOUNTER — Encounter (HOSPITAL_COMMUNITY): Payer: Self-pay | Admitting: Internal Medicine

## 2022-09-04 ENCOUNTER — Encounter (HOSPITAL_COMMUNITY)
Admission: EM | Disposition: A | Discharge status: Skilled Nursing Facility | Payer: Self-pay | Source: Ambulatory Visit | Attending: Internal Medicine

## 2022-09-04 DIAGNOSIS — M86172 Other acute osteomyelitis, left ankle and foot: Secondary | ICD-10-CM

## 2022-09-04 DIAGNOSIS — Z794 Long term (current) use of insulin: Secondary | ICD-10-CM | POA: Diagnosis not present

## 2022-09-04 DIAGNOSIS — E11628 Type 2 diabetes mellitus with other skin complications: Secondary | ICD-10-CM

## 2022-09-04 DIAGNOSIS — E039 Hypothyroidism, unspecified: Secondary | ICD-10-CM

## 2022-09-04 DIAGNOSIS — M86672 Other chronic osteomyelitis, left ankle and foot: Secondary | ICD-10-CM

## 2022-09-04 DIAGNOSIS — I2510 Atherosclerotic heart disease of native coronary artery without angina pectoris: Secondary | ICD-10-CM

## 2022-09-04 DIAGNOSIS — M869 Osteomyelitis, unspecified: Secondary | ICD-10-CM | POA: Diagnosis not present

## 2022-09-04 DIAGNOSIS — L089 Local infection of the skin and subcutaneous tissue, unspecified: Secondary | ICD-10-CM

## 2022-09-04 DIAGNOSIS — I10 Essential (primary) hypertension: Secondary | ICD-10-CM

## 2022-09-04 DIAGNOSIS — M86072 Acute hematogenous osteomyelitis, left ankle and foot: Secondary | ICD-10-CM

## 2022-09-04 DIAGNOSIS — Z87891 Personal history of nicotine dependence: Secondary | ICD-10-CM

## 2022-09-04 DIAGNOSIS — D638 Anemia in other chronic diseases classified elsewhere: Secondary | ICD-10-CM

## 2022-09-04 DIAGNOSIS — E1169 Type 2 diabetes mellitus with other specified complication: Secondary | ICD-10-CM

## 2022-09-04 HISTORY — PX: AMPUTATION TOE: SHX6595

## 2022-09-04 LAB — BLOOD CULTURE ID PANEL (REFLEXED) - BCID2

## 2022-09-04 LAB — GLUCOSE, CAPILLARY
Glucose-Capillary: 185 mg/dL — ABNORMAL HIGH (ref 70–99)
Glucose-Capillary: 262 mg/dL — ABNORMAL HIGH (ref 70–99)
Glucose-Capillary: 220 mg/dL — ABNORMAL HIGH (ref 70–99)
Glucose-Capillary: 147 mg/dL — ABNORMAL HIGH (ref 70–99)
Glucose-Capillary: 124 mg/dL — ABNORMAL HIGH (ref 70–99)
Glucose-Capillary: 207 mg/dL — ABNORMAL HIGH (ref 70–99)

## 2022-09-04 LAB — BASIC METABOLIC PANEL
Anion gap: 13 (ref 5–15)
BUN: 60 mg/dL — ABNORMAL HIGH (ref 8–23)
CO2: 29 mmol/L (ref 22–32)
Calcium: 8.6 mg/dL — ABNORMAL LOW (ref 8.9–10.3)
Chloride: 98 mmol/L (ref 98–111)
Creatinine, Ser: 2.94 mg/dL — ABNORMAL HIGH (ref 0.44–1.00)
GFR, Estimated: 16 mL/min — ABNORMAL LOW (ref >60)
Glucose, Bld: 191 mg/dL — ABNORMAL HIGH (ref 70–99)
Potassium: 4 mmol/L (ref 3.5–5.1)
Sodium: 140 mmol/L (ref 135–145)

## 2022-09-04 LAB — CBC
HCT: 26.6 % — ABNORMAL LOW (ref 36.0–46.0)
Hemoglobin: 8.6 g/dL — ABNORMAL LOW (ref 12.0–15.0)
MCH: 31.3 pg (ref 26.0–34.0)
MCHC: 32.3 g/dL (ref 30.0–36.0)
MCV: 96.7 fL (ref 80.0–100.0)
Platelets: 521 10*3/uL — ABNORMAL HIGH (ref 150–400)
RBC: 2.75 MIL/uL — ABNORMAL LOW (ref 3.87–5.11)
RDW: 14.2 % (ref 11.5–15.5)
WBC: 11.2 10*3/uL — ABNORMAL HIGH (ref 4.0–10.5)
nRBC: 0 % (ref 0.0–0.2)

## 2022-09-04 LAB — APTT
aPTT: 32 seconds (ref 24–36)
aPTT: 35 seconds (ref 24–36)

## 2022-09-04 LAB — LACTIC ACID, PLASMA: Lactic Acid, Venous: 1.6 mmol/L (ref 0.5–1.9)

## 2022-09-04 LAB — HEMOGLOBIN A1C
Hgb A1c MFr Bld: 6.8 % — ABNORMAL HIGH (ref 4.8–5.6)
Mean Plasma Glucose: 148.46 mg/dL

## 2022-09-04 LAB — SURGICAL PCR SCREEN
MRSA, PCR: POSITIVE — AB
Staphylococcus aureus: POSITIVE — AB

## 2022-09-04 LAB — MAGNESIUM: Magnesium: 1.6 mg/dL — ABNORMAL LOW (ref 1.7–2.4)

## 2022-09-04 LAB — HEPARIN LEVEL (UNFRACTIONATED): Heparin Unfractionated: >1.1 IU/mL — ABNORMAL HIGH (ref 0.30–0.70)

## 2022-09-04 LAB — PROTIME-INR
INR: 1.2 (ref 0.8–1.2)
Prothrombin Time: 15.5 seconds — ABNORMAL HIGH (ref 11.4–15.2)

## 2022-09-04 LAB — PHOSPHORUS: Phosphorus: 4.4 mg/dL (ref 2.5–4.6)

## 2022-09-04 SURGERY — AMPUTATION, TOE
Anesthesia: Monitor Anesthesia Care | Site: Toe | Laterality: Left

## 2022-09-04 MED ORDER — SODIUM CHLORIDE 0.9 % IV SOLN: INTRAVENOUS | Status: DC | PRN

## 2022-09-04 MED ORDER — MELATONIN 5 MG PO TABS
5.0000 mg | ORAL_TABLET | Freq: Every evening | ORAL | Status: DC | PRN
  Administered 2022-09-07 – 2022-09-14 (×4): 5 mg via ORAL
  Filled 2022-09-04 (×4): qty 1

## 2022-09-04 MED ORDER — CHLORHEXIDINE GLUCONATE CLOTH 2 % EX PADS
6.0000 | MEDICATED_PAD | Freq: Once | CUTANEOUS | Status: DC
  Administered 2022-09-04: 6 via TOPICAL

## 2022-09-04 MED ORDER — PROPOFOL 500 MG/50ML IV EMUL
INTRAVENOUS | Status: DC | PRN
  Administered 2022-09-04: 50 ug/kg/min via INTRAVENOUS

## 2022-09-04 MED ORDER — LIDOCAINE HCL 2 % IJ SOLN
INTRAMUSCULAR | Status: DC | PRN
  Administered 2022-09-04 (×2): 10 mL

## 2022-09-04 MED ORDER — HYDROMORPHONE HCL 1 MG/ML IJ SOLN
INTRAMUSCULAR | Status: AC
  Filled 2022-09-04: qty 1

## 2022-09-04 MED ORDER — METOPROLOL SUCCINATE ER 100 MG PO TB24
100.0000 mg | ORAL_TABLET | Freq: Every day | ORAL | Status: DC
  Administered 2022-09-05 – 2022-09-08 (×4): 100 mg via ORAL
  Filled 2022-09-04 (×4): qty 2

## 2022-09-04 MED ORDER — ONDANSETRON HCL 4 MG/2ML IJ SOLN
INTRAMUSCULAR | Status: DC | PRN
  Administered 2022-09-04: 4 mg via INTRAVENOUS

## 2022-09-04 MED ORDER — INSULIN ASPART 100 UNIT/ML IJ SOLN
0.0000 [IU] | Freq: Every day | INTRAMUSCULAR | Status: DC
  Administered 2022-09-04: 2 [IU] via SUBCUTANEOUS
  Administered 2022-09-05: 3 [IU] via SUBCUTANEOUS
  Administered 2022-09-06: 2 [IU] via SUBCUTANEOUS

## 2022-09-04 MED ORDER — HEPARIN (PORCINE) 25000 UT/250ML-% IV SOLN
1200.0000 [IU]/h | INTRAVENOUS | Status: DC
  Administered 2022-09-04: 1200 [IU]/h via INTRAVENOUS
  Filled 2022-09-04: qty 250

## 2022-09-04 MED ORDER — INSULIN GLARGINE-YFGN 100 UNIT/ML ~~LOC~~ SOLN
10.0000 [IU] | Freq: Every day | SUBCUTANEOUS | Status: DC
  Administered 2022-09-04 – 2022-09-05 (×2): 10 [IU] via SUBCUTANEOUS
  Filled 2022-09-04 (×2): qty 0.1

## 2022-09-04 MED ORDER — PROPOFOL 10 MG/ML IV BOLUS
INTRAVENOUS | Status: DC | PRN
  Administered 2022-09-04 (×3): 20 mg via INTRAVENOUS

## 2022-09-04 MED ORDER — EPHEDRINE 5 MG/ML INJ
INTRAVENOUS | Status: AC
  Filled 2022-09-04: qty 10

## 2022-09-04 MED ORDER — BUPIVACAINE HCL (PF) 0.5 % IJ SOLN
INTRAMUSCULAR | Status: AC
  Filled 2022-09-04: qty 30

## 2022-09-04 MED ORDER — PROCHLORPERAZINE EDISYLATE 10 MG/2ML IJ SOLN
5.0000 mg | Freq: Four times a day (QID) | INTRAMUSCULAR | Status: DC | PRN
  Filled 2022-09-04: qty 1

## 2022-09-04 MED ORDER — HYDROMORPHONE HCL 1 MG/ML IJ SOLN
0.5000 mg | INTRAMUSCULAR | Status: DC | PRN
  Administered 2022-09-04 – 2022-09-11 (×12): 0.5 mg via INTRAVENOUS
  Filled 2022-09-04 (×13): qty 0.5

## 2022-09-04 MED ORDER — AMLODIPINE BESYLATE 5 MG PO TABS: 5.0000 mg | ORAL_TABLET | Freq: Every day | ORAL | Status: DC

## 2022-09-04 MED ORDER — PHENYLEPHRINE HCL-NACL 20-0.9 MG/250ML-% IV SOLN
INTRAVENOUS | Status: DC | PRN
  Administered 2022-09-04: 25 ug/min via INTRAVENOUS

## 2022-09-04 MED ORDER — GABAPENTIN 100 MG PO CAPS
200.0000 mg | ORAL_CAPSULE | Freq: Three times a day (TID) | ORAL | Status: DC
  Administered 2022-09-04 – 2022-09-15 (×32): 200 mg via ORAL
  Filled 2022-09-04 (×32): qty 2

## 2022-09-04 MED ORDER — AMIODARONE HCL 200 MG PO TABS
200.0000 mg | ORAL_TABLET | Freq: Every day | ORAL | Status: DC
  Administered 2022-09-05 – 2022-09-15 (×11): 200 mg via ORAL
  Filled 2022-09-04 (×11): qty 1

## 2022-09-04 MED ORDER — LIDOCAINE HCL 2 % IJ SOLN
INTRAMUSCULAR | Status: AC
  Filled 2022-09-04: qty 20

## 2022-09-04 MED ORDER — VANCOMYCIN HCL 2000 MG/400ML IV SOLN
2000.0000 mg | Freq: Once | INTRAVENOUS | Status: AC
  Administered 2022-09-04: 2000 mg via INTRAVENOUS
  Filled 2022-09-04: qty 400

## 2022-09-04 MED ORDER — CHLORHEXIDINE GLUCONATE CLOTH 2 % EX PADS: 6.0000 | MEDICATED_PAD | Freq: Once | CUTANEOUS | Status: DC

## 2022-09-04 MED ORDER — PHENYLEPHRINE 80 MCG/ML (10ML) SYRINGE FOR IV PUSH (FOR BLOOD PRESSURE SUPPORT)
PREFILLED_SYRINGE | INTRAVENOUS | Status: AC
  Filled 2022-09-04: qty 20

## 2022-09-04 MED ORDER — HEPARIN (PORCINE) 25000 UT/250ML-% IV SOLN: 1500.0000 [IU]/h | INTRAVENOUS | Status: DC

## 2022-09-04 MED ORDER — FENTANYL CITRATE (PF) 100 MCG/2ML IJ SOLN: 25.0000 ug | INTRAMUSCULAR | Status: DC | PRN

## 2022-09-04 MED ORDER — VANCOMYCIN HCL 1500 MG/300ML IV SOLN: 1500.0000 mg | INTRAVENOUS | Status: DC

## 2022-09-04 MED ORDER — CHLORHEXIDINE GLUCONATE 0.12 % MT SOLN
OROMUCOSAL | Status: AC
  Filled 2022-09-04: qty 15

## 2022-09-04 MED ORDER — BUPIVACAINE HCL (PF) 0.5 % IJ SOLN
INTRAMUSCULAR | Status: DC | PRN
  Administered 2022-09-04 (×2): 10 mL

## 2022-09-04 MED ORDER — FENTANYL CITRATE (PF) 250 MCG/5ML IJ SOLN
INTRAMUSCULAR | Status: DC | PRN
  Administered 2022-09-04 (×2): 25 ug via INTRAVENOUS

## 2022-09-04 MED ORDER — POLYETHYLENE GLYCOL 3350 17 G PO PACK
17.0000 g | PACK | Freq: Every day | ORAL | Status: DC | PRN
  Administered 2022-09-12: 17 g via ORAL
  Filled 2022-09-04 (×2): qty 1

## 2022-09-04 MED ORDER — SODIUM CHLORIDE 0.9 % IV SOLN
2.0000 g | INTRAVENOUS | Status: DC
  Administered 2022-09-04 – 2022-09-06 (×3): 2 g via INTRAVENOUS
  Filled 2022-09-04 (×3): qty 20

## 2022-09-04 MED ORDER — 0.9 % SODIUM CHLORIDE (POUR BTL) OPTIME
TOPICAL | Status: DC | PRN
  Administered 2022-09-04: 1000 mL

## 2022-09-04 MED ORDER — CHLORHEXIDINE GLUCONATE CLOTH 2 % EX PADS
6.0000 | MEDICATED_PAD | Freq: Every day | CUTANEOUS | Status: AC
  Administered 2022-09-04 – 2022-09-08 (×5): 6 via TOPICAL

## 2022-09-04 MED ORDER — LIDOCAINE 2% (20 MG/ML) 5 ML SYRINGE
INTRAMUSCULAR | Status: AC
  Filled 2022-09-04: qty 10

## 2022-09-04 MED ORDER — METRONIDAZOLE 500 MG/100ML IV SOLN
500.0000 mg | Freq: Two times a day (BID) | INTRAVENOUS | Status: DC
  Administered 2022-09-04 – 2022-09-06 (×5): 500 mg via INTRAVENOUS
  Filled 2022-09-04 (×5): qty 100

## 2022-09-04 MED ORDER — FENTANYL CITRATE (PF) 250 MCG/5ML IJ SOLN
INTRAMUSCULAR | Status: AC
  Filled 2022-09-04: qty 5

## 2022-09-04 MED ORDER — MUPIROCIN 2 % EX OINT
1.0000 | TOPICAL_OINTMENT | Freq: Two times a day (BID) | CUTANEOUS | Status: AC
  Administered 2022-09-04 – 2022-09-08 (×10): 1 via NASAL
  Filled 2022-09-04 (×4): qty 22

## 2022-09-04 MED ORDER — ONDANSETRON HCL 4 MG/2ML IJ SOLN
INTRAMUSCULAR | Status: AC
  Filled 2022-09-04: qty 4

## 2022-09-04 MED ORDER — INSULIN ASPART 100 UNIT/ML IJ SOLN
0.0000 [IU] | Freq: Three times a day (TID) | INTRAMUSCULAR | Status: DC
  Administered 2022-09-04: 5 [IU] via SUBCUTANEOUS
  Administered 2022-09-04 – 2022-09-05 (×2): 3 [IU] via SUBCUTANEOUS
  Administered 2022-09-05: 5 [IU] via SUBCUTANEOUS
  Administered 2022-09-05: 3 [IU] via SUBCUTANEOUS
  Administered 2022-09-06: 5 [IU] via SUBCUTANEOUS
  Administered 2022-09-06: 2 [IU] via SUBCUTANEOUS
  Administered 2022-09-06: 3 [IU] via SUBCUTANEOUS
  Administered 2022-09-07: 5 [IU] via SUBCUTANEOUS

## 2022-09-04 MED ORDER — ACETAMINOPHEN 325 MG PO TABS
650.0000 mg | ORAL_TABLET | Freq: Four times a day (QID) | ORAL | Status: DC | PRN
  Administered 2022-09-06 – 2022-09-08 (×2): 650 mg via ORAL
  Filled 2022-09-04 (×2): qty 2

## 2022-09-04 SURGICAL SUPPLY — 43 items
BAG COUNTER SPONGE SURGICOUNT (BAG) ×1 IMPLANT
BAG SPNG CNTER NS LX DISP (BAG) ×1
BLADE LONG MED 31X9 (MISCELLANEOUS) IMPLANT
BNDG CMPR 5X3 KNIT ELC UNQ LF (GAUZE/BANDAGES/DRESSINGS)
BNDG CMPR 5X4 KNIT ELC UNQ LF (GAUZE/BANDAGES/DRESSINGS) ×1
BNDG CMPR 75X21 PLY HI ABS (MISCELLANEOUS)
BNDG CMPR 9X4 STRL LF SNTH (GAUZE/BANDAGES/DRESSINGS) ×1
BNDG ELASTIC 3INX 5YD STR LF (GAUZE/BANDAGES/DRESSINGS) ×1 IMPLANT
BNDG ELASTIC 4INX 5YD STR LF (GAUZE/BANDAGES/DRESSINGS) IMPLANT
BNDG ESMARK 4X9 LF (GAUZE/BANDAGES/DRESSINGS) ×1 IMPLANT
BNDG GAUZE DERMACEA FLUFF 4 (GAUZE/BANDAGES/DRESSINGS) ×1 IMPLANT
BNDG GZE DERMACEA 4 6PLY (GAUZE/BANDAGES/DRESSINGS) ×1
CNTNR URN SCR LID CUP LEK RST (MISCELLANEOUS) IMPLANT
CONT SPEC 4OZ STRL OR WHT (MISCELLANEOUS) ×1
CUFF TOURN SGL QUICK 18X4 (TOURNIQUET CUFF) IMPLANT
DRSG EMULSION OIL 3X3 NADH (GAUZE/BANDAGES/DRESSINGS) ×1 IMPLANT
DRSG XEROFORM 1X8 (GAUZE/BANDAGES/DRESSINGS) IMPLANT
DURAPREP 26ML APPLICATOR (WOUND CARE) ×1 IMPLANT
ELECT REM PT RETURN 9FT ADLT (ELECTROSURGICAL) ×1
ELECTRODE REM PT RTRN 9FT ADLT (ELECTROSURGICAL) ×1 IMPLANT
GAUZE SPONGE 4X4 12PLY STRL (GAUZE/BANDAGES/DRESSINGS) ×1 IMPLANT
GAUZE STRETCH 2X75IN STRL (MISCELLANEOUS) ×1 IMPLANT
GLOVE BIO SURGEON STRL SZ8 (GLOVE) ×2 IMPLANT
GOWN STRL REUS W/ TWL LRG LVL3 (GOWN DISPOSABLE) ×1 IMPLANT
GOWN STRL REUS W/ TWL XL LVL3 (GOWN DISPOSABLE) ×1 IMPLANT
GOWN STRL REUS W/TWL LRG LVL3 (GOWN DISPOSABLE) ×1
GOWN STRL REUS W/TWL XL LVL3 (GOWN DISPOSABLE) ×1
KIT BASIN OR (CUSTOM PROCEDURE TRAY) ×1 IMPLANT
NDL HYPO 25X1 1.5 SAFETY (NEEDLE) ×1 IMPLANT
NEEDLE HYPO 25X1 1.5 SAFETY (NEEDLE) ×3 IMPLANT
NS IRRIG 1000ML POUR BTL (IV SOLUTION) IMPLANT
PACK ORTHO EXTREMITY (CUSTOM PROCEDURE TRAY) ×1 IMPLANT
SUCTION FRAZIER HANDLE 10FR (MISCELLANEOUS) ×1
SUCTION TUBE FRAZIER 10FR DISP (MISCELLANEOUS) ×1 IMPLANT
SUT PROLENE 3 0 PS 2 (SUTURE) ×1 IMPLANT
SUT PROLENE 3 0 SH 1 (SUTURE) IMPLANT
SWAB COLLECTION DEVICE MRSA (MISCELLANEOUS) IMPLANT
SWAB CULTURE ESWAB REG 1ML (MISCELLANEOUS) IMPLANT
SYR 10ML LL (SYRINGE) IMPLANT
SYR CONTROL 10ML LL (SYRINGE) IMPLANT
TUBE CONNECTING 12X1/4 (SUCTIONS) ×1 IMPLANT
UNDERPAD 30X36 HEAVY ABSORB (UNDERPADS AND DIAPERS) ×1 IMPLANT
YANKAUER SUCT BULB TIP NO VENT (SUCTIONS) IMPLANT

## 2022-09-04 NOTE — Progress Notes (Signed)
ANTICOAGULATION CONSULT NOTE - Consult  Pharmacy Consult for Heparin Indication: atrial fibrillation  Allergies  Allergen Reactions   Dulaglutide Nausea Only, Other (See Comments) and Cough    TRULICITY made the patient sick to her stomach (only) several days after using it   Oxycodone Itching and Other (See Comments)    Pt still takes this med even thought it causes itching   Trelegy Ellipta [Fluticasone-Umeclidin-Vilant] Nausea And Vomiting, Other (See Comments) and Cough    Made the patient sick to her stomach (only) several days after using it     Patient Measurements: Weight: 100.5 kg (221 lb 9 oz) Heparin Dosing Weight: 80 kg  Vital Signs: Temp: 98.2 F (36.8 C) (05/03 0753) Temp Source: Oral (05/03 0753) BP: 119/50 (05/03 0753) Pulse Rate: 62 (05/03 0753)  Labs: Recent Labs    09/03/22 1747 09/04/22 0128 09/04/22 0129 09/04/22 1133  HGB 9.1*  --  8.6*  --   HCT 29.4*  --  26.6*  --   PLT 542*  --  521*  --   APTT  --  32  --  35  LABPROT  --  15.5*  --   --   INR  --  1.2  --   --   HEPARINUNFRC  --   --  >1.10*  --   CREATININE 2.85*  --  2.94*  --      Estimated Creatinine Clearance: 17.6 mL/min (A) (by C-G formula based on SCr of 2.94 mg/dL (H)).   Medical History: Past Medical History:  Diagnosis Date   Allergy    Anxiety    Arthritis    CAD (coronary artery disease)    Mild per remote cath in 2006, /    Nuclear, January, 2013, no significant abnormality,   Carotid artery disease (HCC)    Doppler, January, 2013, 0 39% RIC A., 40-59% LICA, stable   Cataract    CHF (congestive heart failure) (HCC)    Chronic kidney disease    Complication of anesthesia    wakes up "mean"   COPD 02/16/2007   Intolerant of Spiriva, tudorza Intolerant of Trelegy    COPD (chronic obstructive pulmonary disease) (HCC)    Coronary atherosclerosis 11/24/2008   Catheterization 2006, nonobstructive coronary disease   //   nuclear 2013, low risk     Diabetes mellitus,  type 2 (HCC)    Diverticula of colon    DIVERTICULOSIS OF COLON 03/23/2007   Qualifier: Diagnosis of  By: Craige Cotta MD, Vineet     Dizziness    occasional   Dizzy spells 05/04/2011   DM (diabetes mellitus) (HCC) 03/23/2007   Qualifier: Diagnosis of  By: Craige Cotta MD, Vineet     Dyspnea    On exertion   Ejection fraction    Essential hypertension 02/16/2007   Qualifier: Diagnosis of  By: Earlene Plater CMA, Tammy     G E R D 03/23/2007   Qualifier: Diagnosis of  By: Craige Cotta MD, Vineet     Gall bladder disease 04/28/2016   Gastritis and gastroduodenitis    GERD (gastroesophageal reflux disease)    Goiter    hx of   Headache(784.0)    migraine   Hyperlipidemia    Hypertension    Hypothyroidism    HYPOTHYROIDISM, POSTSURGICAL 06/04/2008   Qualifier: Diagnosis of  By: Everardo All MD, Sean A    Left ventricular hypertrophy    Leg edema    occasional swelling   Leg swelling 12/09/2018   Myofascial pain syndrome,  cervical 01/06/2018   Obesity    OBESITY 11/24/2008   Qualifier: Diagnosis of  By: Vikki Ports     OBSTRUCTIVE SLEEP APNEA 02/16/2007   Auto CPAP 09/03/13 to 10/02/13 >> used on 30 of 30 nights with average 6 hrs and 3 min.  Average AHI is 3.1 with median CPAP 5 cm H2O and 95 th percentile CPAP 6 cm H2O.     Occipital neuralgia of right side 01/06/2018   OSA (obstructive sleep apnea)    cpap setting of 12   Pain in joint of right hip 01/21/2018   Palpitations 01/21/2011   48 hour Holter, 2015, scattered PACs and PVCs.    Paroxysmal atrial fibrillation (HCC)    Pneumonia    PONV (postoperative nausea and vomiting)    severe after every surgery   Pulmonary hypertension, secondary    RHINITIS 07/21/2010        RUQ abdominal pain    S/P left TKA 07/27/2011   Sinus node dysfunction (HCC) 11/08/2015   Sleep apnea    Spinal stenosis of cervical region 11/07/2014   Urinary frequency 12/09/2018   UTI (urinary tract infection) 08/15/2019   per pt report   VENTRICULAR HYPERTROPHY, LEFT  11/24/2008   Qualifier: Diagnosis of  By: Vikki Ports      Medications:  Medications Prior to Admission  Medication Sig Dispense Refill Last Dose   acetaminophen (TYLENOL) 500 MG tablet Take 1,000-1,500 mg by mouth every 8 (eight) hours as needed for mild pain, moderate pain or headache.   unknown   albuterol (PROVENTIL) (2.5 MG/3ML) 0.083% nebulizer solution Take 3 mLs (2.5 mg total) by nebulization every 2 (two) hours as needed for wheezing or shortness of breath. 75 mL 5 Past Week   albuterol (VENTOLIN HFA) 108 (90 Base) MCG/ACT inhaler Inhale 2 puffs into the lungs every 6 (six) hours as needed for wheezing or shortness of breath. 3 each 3 Past Week   allopurinol (ZYLOPRIM) 100 MG tablet Take 100 mg by mouth 2 (two) times daily.   09/03/2022   amiodarone (PACERONE) 200 MG tablet Take 1 tablet (200 mg total) by mouth daily. 90 tablet 3 09/03/2022   amLODipine (NORVASC) 5 MG tablet Take 1 tablet (5 mg total) by mouth daily. 90 tablet 3 09/03/2022   ascorbic acid (VITAMIN C) 500 MG tablet Take 500 mg by mouth daily with lunch.   Past Week   atorvastatin (LIPITOR) 40 MG tablet Take 1 tablet (40 mg total) by mouth daily. 90 tablet 3 09/03/2022   Budeson-Glycopyrrol-Formoterol (BREZTRI AEROSPHERE) 160-9-4.8 MCG/ACT AERO Inhale 2 puffs into the lungs in the morning and at bedtime. 3 each 10 09/03/2022   Carboxymethylcellulose Sodium (ARTIFICIAL TEARS OP) Place 1 drop into both eyes in the morning and at bedtime.      chlorthalidone (HYGROTON) 25 MG tablet Take 25 mg by mouth daily.   09/03/2022   ELIQUIS 2.5 MG TABS tablet TAKE 1 TABLET TWICE DAILY 180 tablet 3 09/03/2022 at 0800   FARXIGA 10 MG TABS tablet Take 10 mg by mouth daily.   09/03/2022   ferrous sulfate 325 (65 FE) MG tablet Take 1 tablet (325 mg total) by mouth daily with breakfast. 90 tablet 3 09/03/2022   fluticasone (FLONASE) 50 MCG/ACT nasal spray USE 2 SPRAYS NASALLY DAILY 48 g 1    gabapentin (NEURONTIN) 800 MG tablet Take 800 mg by mouth See  admin instructions. Take 800 mg by mouth three to four times a day   09/03/2022  HYDROcodone-acetaminophen (NORCO) 10-325 MG tablet Take 1 tablet by mouth See admin instructions. Take 1 tablet by mouth three to four times a day   09/03/2022   insulin aspart (NOVOLOG) 100 UNIT/ML FlexPen Inject 10-17 Units into the skin See admin instructions. Inject 10-17 units into the skin three times a day with meals "as needed," per SLIDING SCALE      levothyroxine (SYNTHROID) 200 MCG tablet Take 200 mcg by mouth daily before breakfast.   09/03/2022   losartan (COZAAR) 25 MG tablet Take 25 mg by mouth daily.   09/03/2022   metoprolol succinate (TOPROL-XL) 100 MG 24 hr tablet TAKE 1 TABLET EVERY DAY WITH OR IMMEDIATELY AFTER A MEAL 90 tablet 3 09/03/2022 at 0800   nitroGLYCERIN (NITROSTAT) 0.4 MG SL tablet Place 1 tablet (0.4 mg total) under the tongue every 5 (five) minutes as needed for chest pain. 25 tablet 4    vitamin B-12 (CYANOCOBALAMIN) 1000 MCG tablet Take 1,000 mcg by mouth daily with lunch.      Vitamin D3 (VITAMIN D) 25 MCG tablet Take 1,000 Units by mouth daily with lunch.      Alcohol Swabs (B-D SINGLE USE SWABS REGULAR) PADS       Blood Glucose Calibration (TRUE METRIX LEVEL 1) Low SOLN       collagenase (SANTYL) 250 UNIT/GM ointment Apply 1 Application topically daily. 15 g 0    insulin glargine (LANTUS) 100 UNIT/ML injection Inject 0.55 mLs (55 Units total) into the skin 2 (two) times daily. (Patient taking differently: Inject 50-90 Units into the skin 2 (two) times daily after a meal.) 10 mL 11    metolazone (ZAROXOLYN) 5 MG tablet Take one tablet by mouth daily for 3 days 30 minutes before taking your torsemide. 3 tablet 0    naloxone (NARCAN) nasal spray 4 mg/0.1 mL Place 4 mg into the nose once as needed for opioid reversal.      PRESCRIPTION MEDICATION CPAP- At bedtime and as needed during any time of rest      torsemide (DEMADEX) 20 MG tablet Take 5 tablets (100 mg total) by mouth daily. 135 tablet 3     TRUE METRIX BLOOD GLUCOSE TEST test strip       TRUEplus Lancets 28G MISC        Assessment: 82 y.o. female admitted with L foot wound/osteomyelitis, h/o Afib and Eliquis on hold. Pharmacy consulted to transition to heparin.  Last dose of Eliquis ~0800 5/2.  aPTT is SUBtherapeutic at 35 this afternoon. No issues with bleeding or infusion per RN.   Goal of Therapy:  aPTT 66-102 sec Heparin level 0.3-0.7 units/ml Monitor platelets by anticoagulation protocol: Yes   Plan:  Increase heparin infusion to 1500 units/hr Check anti-Xa level in 6-8 hours and daily while on heparin Continue to monitor H&H and platelets   Thank you for allowing [place] to participate in this patients care. Signe Colt, PharmD 09/04/2022 1:08 PM  **Pharmacist phone directory can be found on [email or web address] listed under [facility]**

## 2022-09-04 NOTE — Consult Note (Cosign Needed)
[name] NOTE   MRN : [id number]  Reason for Consult: Left foot non healing ulcer plantar GT Referring Physician: Dr. Ardelle Anton DPM  History of Present Illness: 82 y/o female with non healing left foot ulcer complicated by osteomyelitis.  She has a history of uncontrolled DM, diabetic polyneuropathy, sick sinus syndrome status post pacemaker placement, hyperlipidemia, obesity, chronic venous insufficiency, COPD, gout, iron deficiency anemia, hypertension, paroxysmal A-fib on Eliquis.  She has a history of 4th toe amputation 01/22/22 that healed.   She states she ambulates with a walker secondary to multiple falls over the last year.  She has history of lumbar and cervical fusion.  She denies claudication at short distance or rest pain.  Her and her husband do go out to dinner and she is independent on her walker for ADL's.    She is medically managed on Insulin, Statin and Eliquis for Afib, CKD Cr 3 and GFR of 16.  Her cr historically has been 1.6-2.0.         Current Facility-Administered Medications  Medication Dose Route Frequency Provider Last Rate Last Admin   acetaminophen (TYLENOL) tablet 650 mg  650 mg Oral Q6H PRN Dow Adolph N, DO       albuterol (PROVENTIL) (2.5 MG/3ML) 0.083% nebulizer solution 2.5 mg  2.5 mg Nebulization Q2H PRN Dow Adolph N, DO   2.5 mg at 09/04/22 4098   amiodarone (PACERONE) tablet 200 mg  200 mg Oral Daily Hall, Carole N, DO       atorvastatin (LIPITOR) tablet 40 mg  40 mg Oral Daily Hall, Carole N, DO       cefTRIAXone (ROCEPHIN) 2 g in sodium chloride 0.9 % 100 mL IVPB  2 g Intravenous Q24H Renne Crigler, PA-C       And   metroNIDAZOLE (FLAGYL) IVPB 500 mg  500 mg Intravenous Q12H Renne Crigler, PA-C 100 mL/hr at 09/04/22 0820 500 mg at 09/04/22 0820   Chlorhexidine Gluconate Cloth 2 % PADS 6 each  6 each Topical Q0600 Darlin Drop, DO   6 each at 09/04/22 1191   cholecalciferol (VITAMIN D3) 25 MCG (1000 UNIT)  tablet 1,000 Units  1,000 Units Oral Q lunch Anthem, Carole N, DO       cyanocobalamin (VITAMIN B12) tablet 1,000 mcg  1,000 mcg Oral Q lunch [place], Carole N, DO       gabapentin (NEURONTIN) capsule 800 mg  800 mg Oral TID Renne Crigler, PA-C   800 mg at 09/03/22 2338   heparin ADULT infusion 100 units/mL (25000 units/246mL)  1,200 Units/hr Intravenous Continuous Darlin Drop, DO 12 mL/hr at 09/04/22 0611 1,200 Units/hr at 09/04/22 0611   HYDROcodone-acetaminophen (NORCO) 7.5-325 MG per tablet 1 tablet  1 tablet Oral Q6H PRN Renne Crigler, PA-C   1 tablet at 09/03/22 2338   HYDROmorphone (DILAUDID) injection 0.5 mg  0.5 mg Intravenous STAT Dow Adolph N, DO       HYDROmorphone (DILAUDID) injection 0.5 mg  0.5 mg Intravenous Q4H PRN Dow Adolph N, DO       insulin aspart (novoLOG) injection 0-5 Units  0-5 Units Subcutaneous QHS Hall, Carole N, DO       insulin aspart (novoLOG) injection 0-9 Units  0-9 Units Subcutaneous TID WC Dow Adolph N, DO   5 Units at 09/04/22 0817   insulin glargine-yfgn (SEMGLEE) injection 10 Units  10 Units Subcutaneous Daily Dahal, Melina Schools, MD       levothyroxine (SYNTHROID) tablet  200 mcg  200 mcg Oral QAC breakfast Dow Adolph N, DO   200 mcg at 09/04/22 0600   melatonin tablet 5 mg  5 mg Oral QHS PRN Darlin Drop, DO       mupirocin ointment (BACTROBAN) 2 % 1 Application  1 Application Nasal BID Dow Adolph N, DO   1 Application at 09/04/22 0817   polyethylene glycol (MIRALAX / GLYCOLAX) packet 17 g  17 g Oral Daily PRN Dow Adolph N, DO       prochlorperazine (COMPAZINE) injection 5 mg  5 mg Intravenous Q6H PRN Hall, Carole N, DO       sodium chloride 0.9 % bolus 1,000 mL  1,000 mL Intravenous Once Renne Crigler, PA-C       [START ON 09/07/2022] vancomycin (VANCOREADY) IVPB 1500 mg/300 mL  1,500 mg Intravenous Q72H Hall, Carole N, DO        Pt meds include: Statin :Yes Betablocker: Yes ASA: No Other anticoagulants/antiplatelets: Heparin currently, Eliquis  on hold.  Past Medical History:  Diagnosis Date   Allergy    Anxiety    Arthritis    CAD (coronary artery disease)    Mild per remote cath in 2006, /    Nuclear, January, 2013, no significant abnormality,   Carotid artery disease (HCC)    Doppler, January, 2013, 0 39% RIC A., 40-59% LICA, stable   Cataract    CHF (congestive heart failure) (HCC)    Chronic kidney disease    Complication of anesthesia    wakes up "mean"   COPD 02/16/2007   Intolerant of Spiriva, tudorza Intolerant of Trelegy    COPD (chronic obstructive pulmonary disease) (HCC)    Coronary atherosclerosis 11/24/2008   Catheterization 2006, nonobstructive coronary disease   //   nuclear 2013, low risk     Diabetes mellitus, type 2 (HCC)    Diverticula of colon    DIVERTICULOSIS OF COLON 03/23/2007   Qualifier: Diagnosis of  By: Craige Cotta MD, Vineet     Dizziness    occasional   Dizzy spells 05/04/2011   DM (diabetes mellitus) (HCC) 03/23/2007   Qualifier: Diagnosis of  By: Craige Cotta MD, Vineet     Dyspnea    On exertion   Ejection fraction    Essential hypertension 02/16/2007   Qualifier: Diagnosis of  By: Earlene Plater CMA, Tammy     G E R D 03/23/2007   Qualifier: Diagnosis of  By: Craige Cotta MD, Vineet     Gall bladder disease 04/28/2016   Gastritis and gastroduodenitis    GERD (gastroesophageal reflux disease)    Goiter    hx of   Headache(784.0)    migraine   Hyperlipidemia    Hypertension    Hypothyroidism    HYPOTHYROIDISM, POSTSURGICAL 06/04/2008   Qualifier: Diagnosis of  By: Everardo All MD, Sean A    Left ventricular hypertrophy    Leg edema    occasional swelling   Leg swelling 12/09/2018   Myofascial pain syndrome, cervical 01/06/2018   Obesity    OBESITY 11/24/2008   Qualifier: Diagnosis of  By: Vikki Ports     OBSTRUCTIVE SLEEP APNEA 02/16/2007   Auto CPAP 09/03/13 to 10/02/13 >> used on 30 of 30 nights with average 6 hrs and 3 min.  Average AHI is 3.1 with median CPAP 5 cm H2O and 95 th percentile CPAP 6  cm H2O.     Occipital neuralgia of right side 01/06/2018   OSA (obstructive sleep apnea)    cpap  setting of 12   Pain in joint of right hip 01/21/2018   Palpitations 01/21/2011   48 hour Holter, 2015, scattered PACs and PVCs.    Paroxysmal atrial fibrillation (HCC)    Pneumonia    PONV (postoperative nausea and vomiting)    severe after every surgery   Pulmonary hypertension, secondary    RHINITIS 07/21/2010        RUQ abdominal pain    S/P left TKA 07/27/2011   Sinus node dysfunction (HCC) 11/08/2015   Sleep apnea    Spinal stenosis of cervical region 11/07/2014   Urinary frequency 12/09/2018   UTI (urinary tract infection) 08/15/2019   per pt report   VENTRICULAR HYPERTROPHY, LEFT 11/24/2008   Qualifier: Diagnosis of  By: Vikki Ports      Past Surgical History:  Procedure Laterality Date   ABDOMINAL HYSTERECTOMY     with appendectomy and colon polypectomy   AMPUTATION TOE Left 12/15/2019   Procedure: AMPUTATION  LEFT GREAT TOE;  Surgeon: Park Liter, DPM;  Location: [facility];  Service: Podiatry;  Laterality: Left;   AMPUTATION TOE Left 01/22/2022   Procedure: AMPUTATION TOE 4th digit;  Surgeon: Candelaria Stagers, DPM;  Location: [facility];  Service: Podiatry;  Laterality: Left;   ANTERIOR CERVICAL DECOMP/DISCECTOMY FUSION N/A 11/07/2014   Procedure: Anterior Cervical diskectomy and fusion Cervical four-five, Cervical five-six, Cervical six-seven;  Surgeon: Donalee Citrin, MD;  Location: [facility];  Service: Neurosurgery;  Laterality: N/A;   APPENDECTOMY     ARTERY BIOPSY Right 02/16/2017   Procedure: BIOPSY TEMPORAL ARTERY;  Surgeon: Renford Dills, MD;  Location: [facility];  Service: Vascular;  Laterality: Right;   ARTERY BIOPSY Left 04/16/2020   Procedure: BIOPSY TEMPORAL ARTERY;  Surgeon: Renford Dills, MD;  Location: [facility];  Service: Vascular;  Laterality: Left;  LEFT TEMPLE   BACK SURGERY     CARDIAC CATHETERIZATION     CATARACT EXTRACTION W/ INTRAOCULAR LENS   IMPLANT, BILATERAL Bilateral    CHOLECYSTECTOMY N/A 04/29/2016   Procedure: LAPAROSCOPIC CHOLECYSTECTOMY WITH INTRAOPERATIVE CHOLANGIOGRAM;  Surgeon: Ovidio Kin, MD;  Location: [facility];  Service: General;  Laterality: N/A;   EP IMPLANTABLE DEVICE N/A 11/08/2015   MRI COMPATABLE -- Procedure: Pacemaker Implant;  Surgeon: Duke Salvia, MD;  Location: [facility];  Service: Cardiovascular;  Laterality: N/A;   INCISION AND DRAINAGE ABSCESS Right 05/30/2021   Procedure: IRRIGATION AND DEBRIDEMENT OF PREPATELLA BURSA;  Surgeon: Durene Romans, MD;  Location: [facility];  Service: Orthopedics;  Laterality: Right;  60 wants standard knee draping and pulse lavage   INCISION AND DRAINAGE ABSCESS Right 07/22/2021   Procedure: INCISION AND DRAINAGE ABSCESS RIGHT KNEE;  Surgeon: Durene Romans, MD;  Location: [facility];  Service: Orthopedics;  Laterality: Right;   INSERT / REPLACE / REMOVE PACEMAKER  2017   Dr. Clide Cliff [facility])   IR FLUORO GUIDE CV LINE RIGHT  07/25/2021   IR REMOVAL TUN CV CATH W/O FL  09/04/2021   IR US GUIDE VASC ACCESS RIGHT  07/25/2021   JOINT REPLACEMENT     LUMBAR DISC SURGERY  1990's   x 2   THYROIDECTOMY  2011   TOTAL KNEE ARTHROPLASTY  07/27/2011   Procedure: TOTAL KNEE ARTHROPLASTY;  Surgeon: Shelda Pal, MD;  Location: [facility];  Service: Orthopedics;  Laterality: Left;   UPPER ESOPHAGEAL ENDOSCOPIC ULTRASOUND (EUS) N/A 05/14/2016   Procedure: UPPER ESOPHAGEAL ENDOSCOPIC ULTRASOUND (EUS);  Surgeon: Rachael Fee, MD;  Location: [facility];  Service: Endoscopy;  Laterality: N/A;  radial only-will be done mod if needed    Social History Social History   Tobacco Use   Smoking status: Former    Packs/day: 2.00    Years: 33.00    Additional pack years: 0.00    Total pack years: 66.00    Types: Cigarettes    Start date: 1966    Quit date: 05/04/1997    Years since quitting: 25.3   Smokeless tobacco: Never  Vaping Use   Vaping Use: Never used  Substance Use Topics    Alcohol use: No   Drug use: No    Family History Family History  Problem Relation Age of Onset   Heart Problems Mother    Heart defect Mother        valve problems   Heart Problems Father    Heart disease Brother    Lung cancer Daughter        non small cell    Diabetes Maternal Aunt    Diabetes Maternal Uncle    Throat cancer Brother    Colon cancer Neg Hx    Colon polyps Neg Hx    Esophageal cancer Neg Hx    Rectal cancer Neg Hx    Stomach cancer Neg Hx     Allergies  Allergen Reactions   Dulaglutide Nausea Only, Other (See Comments) and Cough    TRULICITY made the patient sick to her stomach (only) several days after using it   Oxycodone Itching and Other (See Comments)    Pt still takes this med even thought it causes itching   Trelegy Ellipta [Fluticasone-Umeclidin-Vilant] Nausea And Vomiting, Other (See Comments) and Cough    Made the patient sick to her stomach (only) several days after using it      REVIEW OF SYSTEMS  General: [ ]  Weight loss, [ ]  Fever, [ ]  chills Neurologic: [ ]  Dizziness, [ ]  Blackouts, [ ]  Seizure [ ]  Stroke, [ ]  "Mini stroke", [ ]  Slurred speech, [ ]  Temporary blindness; [x ] weakness in arms or legs, [ ]  Hoarseness [ ]  Dysphagia Cardiac: [ ]  Chest pain/pressure, [ ]  Shortness of breath at rest [ x] Shortness of breath with exertion, [ ]  Atrial fibrillation or irregular heartbeat  Vascular: [ ]  Pain in legs with walking, [ ]  Pain in legs at rest, [ ]  Pain in legs at night,  [ x] Non-healing ulcer, [ ]  Blood clot in vein/DVT,   Pulmonary: [ ]  Home oxygen, [ ]  Productive cough, [ ]  Coughing up blood, [ ]  Asthma,  [ ]  Wheezing [ ]  COPD Musculoskeletal:  [ x] Arthritis, [x ] Low back pain, [ ]  Joint pain Hematologic: [ ]  Easy Bruising, [ ]  Anemia; [ ]  Hepatitis Gastrointestinal: [ ]  Blood in stool, [ ]  Gastroesophageal Reflux/heartburn, Urinary: [ x] chronic Kidney disease, [ ]  on HD - [ ]  MWF or [ ]  TTHS, [ ]  Burning with urination, [ ]   Difficulty urinating Skin: [ ]  Rashes, [ ]  Wounds Psychological: [ x] Anxiety, [ ]  Depression  Physical Examination Vitals:   09/03/22 2249 09/04/22 0120 09/04/22 0451 09/04/22 0753  BP: (!) 141/55 (!) 152/55  (!) 119/50  Pulse: 62 62  62  Resp:  18  18  Temp:  98.3 F (36.8 C)  98.2 F (36.8 C)  TempSrc:  Oral  Oral  SpO2: 100% 100%  99%  Weight:   100.5 kg    Body mass index is 36.87 kg/m.  General:  WDWN in NAD Gait: Normal HENT: WNL Eyes: Pupils equal Pulmonary: normal non-labored breathing , without Rales, rhonchi,  wheezing Cardiac: RRR, without  Murmurs, rubs or gallops; No carotid bruits Abdomen: soft, NT, no masses Skin: Left foot well healed 4 th amputation site, Gt plantar open wound.      Vascular Exam/Pulses:radial, femoral, popliteal and DP pulses    Musculoskeletal: no muscle wasting or atrophy; mild B  edema  Neurologic: A&O X 3; Appropriate Affect ;  SENSATION: normal; MOTOR FUNCTION: grossly intact B Speech is fluent/normal   Significant Diagnostic Studies: CBC Lab Results  Component Value Date   WBC 11.2 (H) 09/04/2022   HGB 8.6 (L) 09/04/2022   HCT 26.6 (L) 09/04/2022   MCV 96.7 09/04/2022   PLT 521 (H) 09/04/2022    BMET    Component Value Date/Time   NA 140 09/04/2022 0129   NA 140 02/16/2022 1008   K 4.0 09/04/2022 0129   CL 98 09/04/2022 0129   CO2 29 09/04/2022 0129   GLUCOSE 191 (H) 09/04/2022 0129   BUN 60 (H) 09/04/2022 0129   BUN 47 (H) 02/16/2022 1008   CREATININE 2.94 (H) 09/04/2022 0129   CREATININE 1.32 (H) 11/06/2015 1006   CALCIUM 8.6 (L) 09/04/2022 0129   CALCIUM 9.9 06/04/2008 2240   GFRNONAA 16 (L) 09/04/2022 0129   GFRAA 45 (L) 02/16/2020 1124   Estimated Creatinine Clearance: 17.6 mL/min (A) (by C-G formula based on SCr of 2.94 mg/dL (H)).  COAG Lab Results  Component Value Date   INR 1.2 09/04/2022   INR 1.6 (H) 07/21/2021   INR 1.1 10/02/2019     Non-Invasive Vascular Imaging:  ABI Findings:   +---------+------------------+-----+-----------+--------+  Right   Rt Pressure (mmHg)IndexWaveform   Comment   +---------+------------------+-----+-----------+--------+  Brachial 156                                         +---------+------------------+-----+-----------+--------+  PTA     195               1.25 multiphasic          +---------+------------------+-----+-----------+--------+  PERO                           triphasic            +---------+------------------+-----+-----------+--------+  DP      205               1.31 triphasic            +---------+------------------+-----+-----------+--------+  Randie Heinz [id number]               0.75 Normal               +---------+------------------+-----+-----------+--------+   +---------+------------------+-----+-----------+----------+  Left    Lt Pressure (mmHg)IndexWaveform   Comment     +---------+------------------+-----+-----------+----------+  Brachial 148                                           +---------+------------------+-----+-----------+----------+  PTA     254               1.63 triphasic              +---------+------------------+-----+-----------+----------+  PERO  254               1.63 multiphasic            +---------+------------------+-----+-----------+----------+  DP      254               1.63 triphasic              +---------+------------------+-----+-----------+----------+  Great Toe                                  Amputation  +---------+------------------+-----+-----------+----------+   +-------+-----------+-----------+------------+------------+  ABI/TBIToday's ABIToday's TBIPrevious ABIPrevious TBI  +-------+-----------+-----------+------------+------------+  Right 1.31       0.75       1.03        0.84          +-------+-----------+-----------+------------+------------+  Left  Ismay         Amputation [place]          Amputation     +-------+-----------+-----------+------------+------------+        Arterial wall calcification precludes accurate ankle pressures and ABI on  the left. Waveforms are brisk and multiphasic. PPG tracing taken on left  2nd digit due to partial great toe amputation.  Bilateral ABIs and TBIs appear essentially unchanged compared to prior  study on 01/21/2022.    Summary:  Right: Resting right ankle-brachial index is within normal range. The  right toe-brachial index is normal.   Left: Resting left ankle-brachial index indicates noncompressible left  lower extremity arteries.     ASSESSMENT/PLAN:  DM with microvascular disease. Diabetic left GT wound with osteomyelitis She has palpable DP, popliteal, and femoral pulses Multiphasic inflow no TBI recorded for the GT left  I do not feel she needs intervention currently and appears to have sufficient blood flow to heal the up coming amputation.  Her previous 4th toe amputation healed well and this was performed 01/22/22.     We will be available for future vascular needs.  Continue maximum medical therapy with Statin DM control and activity as tolerates.  She has know peripheral neuropathy and needs to be diligent about her foot care.       Mosetta Pigeon 09/04/2022 9:44 AM   I agree with the above.  Have seen and evaluated the patient.  She underwent left great toe amputation by podiatry yesterday for osteomyelitis.  Vascular evaluation included duplex imaging that shows that she should have adequate blood flow to to heal her wound.  She also has palpable pedal pulses.  At this time, no vascular intervention is indicated.  If she does have any concerns of her wound healing, we would proceed with angiography, however at this point, I suspect that this is small vessel disease from her diabetes and that she does not have correctable vascular pathology.  We will be available as needed.  I will not actively follow the patient.  Please  contact me if there are any additional concerns.  Durene Cal

## 2022-09-04 NOTE — H&P (Addendum)
History and Physical  Kayla [name] ZOX:[id number] DOB: 29-May-1940 DOA: 09/03/2022  Referring physician: Clementeen Hoof  PCP: Ailene Ravel, MD  Outpatient Specialists: Podiatry, Cardiology Patient coming from: Home sent from Podiatrist.  Chief Complaint: Left foot pain with diabetic wound.   HPI: Kayla [name] is a 82 y.o. female with medical history significant for uncontrolled type 2 diabetes, diabetic polyneuropathy, sick sinus syndrome status post pacemaker placement, hyperlipidemia, obesity, history of amputation of lesser toe of left foot, chronic venous insufficiency, COPD, gout, iron deficiency anemia, hypertension, paroxysmal A-fib on Eliquis, who presented to [facility] ED at the request of her podiatrist due to concern for left great toe diabetic foot infection and possible osteomyelitis seen on x-ray.  Podiatry recommended IV antibiotics and MRI to confirm osteomyelitis.  Dr. Ardelle Anton will see in consultation in the hospital.  In the ED Tmax 99.6, lab studies notable for mild leukocytosis WBC 10.7, anemia of chronic disease with hemoglobin of 9.1 and platelet count of 542.  Left foot MRI has been ordered and pending.  The patient has a pacemaker that will need to be deactivated by cardiology for the MRI.  Empiric IV antibiotics were initiated in the ED.  The patient received Rocephin and IV Flagyl.  Additionally, she received IV opiate based analgesics.  The patient was admitted by [facility], hospitalist service, to telemetry surgical unit as inpatient status.  ED Course: Tmax 99.6.  Lab studies as stated above.  Review of Systems: Review of systems as noted in the HPI. All other systems reviewed and are negative.   Past Medical History:  Diagnosis Date   Allergy    Anxiety    Arthritis    CAD (coronary artery disease)    Mild per remote cath in 2006, /    Nuclear, January, 2013, no significant abnormality,   Carotid artery disease (HCC)    Doppler, January, 2013, 0 39% RIC A.,  40-59% LICA, stable   Cataract    CHF (congestive heart failure) (HCC)    Chronic kidney disease    Complication of anesthesia    wakes up "mean"   COPD 02/16/2007   Intolerant of Spiriva, tudorza Intolerant of Trelegy    COPD (chronic obstructive pulmonary disease) (HCC)    Coronary atherosclerosis 11/24/2008   Catheterization 2006, nonobstructive coronary disease   //   nuclear 2013, low risk     Diabetes mellitus, type 2 (HCC)    Diverticula of colon    DIVERTICULOSIS OF COLON 03/23/2007   Qualifier: Diagnosis of  By: Craige Cotta MD, Vineet     Dizziness    occasional   Dizzy spells 05/04/2011   DM (diabetes mellitus) (HCC) 03/23/2007   Qualifier: Diagnosis of  By: Craige Cotta MD, Vineet     Dyspnea    On exertion   Ejection fraction    Essential hypertension 02/16/2007   Qualifier: Diagnosis of  By: Earlene Plater CMA, Tammy     G E R D 03/23/2007   Qualifier: Diagnosis of  By: Craige Cotta MD, Vineet     Gall bladder disease 04/28/2016   Gastritis and gastroduodenitis    GERD (gastroesophageal reflux disease)    Goiter    hx of   Headache(784.0)    migraine   Hyperlipidemia    Hypertension    Hypothyroidism    HYPOTHYROIDISM, POSTSURGICAL 06/04/2008   Qualifier: Diagnosis of  By: Everardo All MD, Sean A    Left ventricular hypertrophy    Leg edema    occasional swelling  Leg swelling 12/09/2018   Myofascial pain syndrome, cervical 01/06/2018   Obesity    OBESITY 11/24/2008   Qualifier: Diagnosis of  By: Vikki Ports     OBSTRUCTIVE SLEEP APNEA 02/16/2007   Auto CPAP 09/03/13 to 10/02/13 >> used on 30 of 30 nights with average 6 hrs and 3 min.  Average AHI is 3.1 with median CPAP 5 cm H2O and 95 th percentile CPAP 6 cm H2O.     Occipital neuralgia of right side 01/06/2018   OSA (obstructive sleep apnea)    cpap setting of 12   Pain in joint of right hip 01/21/2018   Palpitations 01/21/2011   48 hour Holter, 2015, scattered PACs and PVCs.    Paroxysmal atrial fibrillation (HCC)    Pneumonia     PONV (postoperative nausea and vomiting)    severe after every surgery   Pulmonary hypertension, secondary    RHINITIS 07/21/2010        RUQ abdominal pain    S/P left TKA 07/27/2011   Sinus node dysfunction (HCC) 11/08/2015   Sleep apnea    Spinal stenosis of cervical region 11/07/2014   Urinary frequency 12/09/2018   UTI (urinary tract infection) 08/15/2019   per pt report   VENTRICULAR HYPERTROPHY, LEFT 11/24/2008   Qualifier: Diagnosis of  By: Vikki Ports     Past Surgical History:  Procedure Laterality Date   ABDOMINAL HYSTERECTOMY     with appendectomy and colon polypectomy   AMPUTATION TOE Left 12/15/2019   Procedure: AMPUTATION  LEFT GREAT TOE;  Surgeon: Park Liter, DPM;  Location: [facility];  Service: Podiatry;  Laterality: Left;   AMPUTATION TOE Left 01/22/2022   Procedure: AMPUTATION TOE 4th digit;  Surgeon: Candelaria Stagers, DPM;  Location: [facility];  Service: Podiatry;  Laterality: Left;   ANTERIOR CERVICAL DECOMP/DISCECTOMY FUSION N/A 11/07/2014   Procedure: Anterior Cervical diskectomy and fusion Cervical four-five, Cervical five-six, Cervical six-seven;  Surgeon: Donalee Citrin, MD;  Location: [facility];  Service: Neurosurgery;  Laterality: N/A;   APPENDECTOMY     ARTERY BIOPSY Right 02/16/2017   Procedure: BIOPSY TEMPORAL ARTERY;  Surgeon: Renford Dills, MD;  Location: [facility] ORS;  Service: Vascular;  Laterality: Right;   ARTERY BIOPSY Left 04/16/2020   Procedure: BIOPSY TEMPORAL ARTERY;  Surgeon: Renford Dills, MD;  Location: [facility] ORS;  Service: Vascular;  Laterality: Left;  LEFT TEMPLE   BACK SURGERY     CARDIAC CATHETERIZATION     CATARACT EXTRACTION W/ INTRAOCULAR LENS  IMPLANT, BILATERAL Bilateral    CHOLECYSTECTOMY N/A 04/29/2016   Procedure: LAPAROSCOPIC CHOLECYSTECTOMY WITH INTRAOPERATIVE CHOLANGIOGRAM;  Surgeon: Ovidio Kin, MD;  Location: [facility];  Service: General;  Laterality: N/A;   EP IMPLANTABLE DEVICE N/A 11/08/2015   MRI COMPATABLE --  Procedure: Pacemaker Implant;  Surgeon: Duke Salvia, MD;  Location: [facility];  Service: Cardiovascular;  Laterality: N/A;   INCISION AND DRAINAGE ABSCESS Right 05/30/2021   Procedure: IRRIGATION AND DEBRIDEMENT OF PREPATELLA BURSA;  Surgeon: Durene Romans, MD;  Location: [facility];  Service: Orthopedics;  Laterality: Right;  60 wants standard knee draping and pulse lavage   INCISION AND DRAINAGE ABSCESS Right 07/22/2021   Procedure: INCISION AND DRAINAGE ABSCESS RIGHT KNEE;  Surgeon: Durene Romans, MD;  Location: [facility];  Service: Orthopedics;  Laterality: Right;   INSERT / REPLACE / REMOVE PACEMAKER  2017   Dr. Clide [facility])   IR FLUORO GUIDE CV LINE RIGHT  07/25/2021   IR REMOVAL  TUN CV CATH W/O FL  09/04/2021   IR US GUIDE VASC ACCESS RIGHT  07/25/2021   JOINT REPLACEMENT     LUMBAR DISC SURGERY  1990's   x 2   THYROIDECTOMY  2011   TOTAL KNEE ARTHROPLASTY  07/27/2011   Procedure: TOTAL KNEE ARTHROPLASTY;  Surgeon: Shelda Pal, MD;  Location: [facility];  Service: Orthopedics;  Laterality: Left;   UPPER ESOPHAGEAL ENDOSCOPIC ULTRASOUND (EUS) N/A 05/14/2016   Procedure: UPPER ESOPHAGEAL ENDOSCOPIC ULTRASOUND (EUS);  Surgeon: Rachael Fee, MD;  Location: [facility];  Service: Endoscopy;  Laterality: N/A;  radial only-will be done mod if needed    Social History:  reports that she quit smoking about 25 years ago. Her smoking use included cigarettes. She started smoking about 58 years ago. She has a 66.00 pack-year smoking history. She has never used smokeless tobacco. She reports that she does not drink alcohol and does not use drugs.   Allergies  Allergen Reactions   Dulaglutide Nausea Only, Other (See Comments) and Cough    TRULICITY made the patient sick to her stomach (only) several days after using it   Oxycodone Itching and Other (See Comments)    Pt still takes this med even thought it causes itching   Trelegy Ellipta [Fluticasone-Umeclidin-Vilant] Nausea And Vomiting,  Other (See Comments) and Cough    Made the patient sick to her stomach (only) several days after using it     Family History  Problem Relation Age of Onset   Heart Problems Mother    Heart defect Mother        valve problems   Heart Problems Father    Heart disease Brother    Lung cancer Daughter        non small cell    Diabetes Maternal Aunt    Diabetes Maternal Uncle    Throat cancer Brother    Colon cancer Neg Hx    Colon polyps Neg Hx    Esophageal cancer Neg Hx    Rectal cancer Neg Hx    Stomach cancer Neg Hx       Prior to Admission medications   Medication Sig Start Date End Date Taking? Authorizing Provider  acetaminophen (TYLENOL) 500 MG tablet Take 1,000-1,500 mg by mouth every 8 (eight) hours as needed for mild pain, moderate pain or headache.    [provider]  albuterol (PROVENTIL) (2.5 MG/3ML) 0.083% nebulizer solution Take 3 mLs (2.5 mg total) by nebulization every 2 (two) hours as needed for wheezing or shortness of breath. 07/03/22   Parrett, Virgel Bouquet, NP  albuterol (VENTOLIN HFA) 108 (90 Base) MCG/ACT inhaler Inhale 2 puffs into the lungs every 6 (six) hours as needed for wheezing or shortness of breath. 07/03/22   Parrett, Virgel Bouquet, NP  Alcohol Swabs (B-D SINGLE USE SWABS REGULAR) PADS  11/24/19   [provider]  allopurinol (ZYLOPRIM) 100 MG tablet Take 100 mg by mouth 2 (two) times daily. 04/16/19   [provider]  amiodarone (PACERONE) 200 MG tablet Take 1 tablet (200 mg total) by mouth daily. 05/22/22   Jake Bathe, MD  amLODipine (NORVASC) 5 MG tablet Take 1 tablet (5 mg total) by mouth daily. 05/28/22   Jake Bathe, MD  ascorbic acid (VITAMIN C) 500 MG tablet Take 500 mg by mouth daily with lunch.    [provider]  atorvastatin (LIPITOR) 40 MG tablet Take 1 tablet (40 mg total) by mouth daily. 05/22/22   Skains,  Veverly Fells, MD  Blood Glucose Calibration (TRUE METRIX LEVEL 1) Low SOLN  11/27/19   [provider]   Budeson-Glycopyrrol-Formoterol (BREZTRI AEROSPHERE) 160-9-4.8 MCG/ACT AERO Inhale 2 puffs into the lungs in the morning and at bedtime. 07/03/22   Parrett, Virgel Bouquet, NP  Budeson-Glycopyrrol-Formoterol (BREZTRI AEROSPHERE) 160-9-4.8 MCG/ACT AERO Inhale 2 puffs into the lungs in the morning and at bedtime. 07/03/22   Parrett, Virgel Bouquet, NP  Carboxymethylcellulose Sodium (ARTIFICIAL TEARS OP) Place 1 drop into both eyes in the morning and at bedtime.    [provider]  chlorthalidone (HYGROTON) 25 MG tablet Take 25 mg by mouth daily. 03/25/22   [provider]  collagenase (SANTYL) 250 UNIT/GM ointment Apply 1 Application topically daily. 02/27/22   Candelaria Stagers, DPM  ELIQUIS 2.5 MG TABS tablet TAKE 1 TABLET TWICE DAILY 04/20/22   Jake Bathe, MD  ferrous sulfate 325 (65 FE) MG tablet Take 1 tablet (325 mg total) by mouth daily with breakfast. 01/26/20   Duke Salvia, MD  fluticasone [facility]) 50 MCG/ACT nasal spray USE 2 SPRAYS NASALLY DAILY 02/26/17   Leslye Peer, MD  gabapentin (NEURONTIN) 800 MG tablet Take 800 mg by mouth See admin instructions. Take 800 mg by mouth three to four times a day    [provider]  HYDROcodone-acetaminophen (NORCO) 10-325 MG tablet Take 1 tablet by mouth See admin instructions. Take 1 tablet by mouth three to four times a day    [provider]  insulin aspart (NOVOLOG) 100 UNIT/ML FlexPen Inject 10-17 Units into the skin See admin instructions. Inject 10-17 units into the skin three times a day with meals "as needed," per [facility] SCALE    [provider]  insulin glargine (LANTUS) 100 UNIT/ML injection Inject 0.55 mLs (55 Units total) into the skin 2 (two) times daily. Patient taking differently: Inject 50-90 Units into the skin 2 (two) times daily after a meal. 07/27/21   Regalado, Belkys A, MD  levothyroxine (SYNTHROID) 200 MCG tablet Take 200 mcg by mouth daily before breakfast.    [provider]  losartan  (COZAAR) 25 MG tablet Take 25 mg by mouth daily. 03/07/22   [provider]  metolazone (ZAROXOLYN) 5 MG tablet Take one tablet by mouth daily for 3 days 30 minutes before taking your torsemide. 02/09/22   Sharlene Dory, PA-C  metoprolol succinate (TOPROL-XL) 100 MG 24 hr tablet TAKE 1 TABLET EVERY DAY WITH OR IMMEDIATELY AFTER A MEAL 03/09/22   Jake Bathe, MD  naloxone [facility]) nasal spray 4 mg/0.1 mL Place 4 mg into the nose once as needed for opioid reversal. 12/04/20   [provider]  nitroGLYCERIN (NITROSTAT) 0.4 MG SL tablet Place 1 tablet (0.4 mg total) under the tongue every 5 (five) minutes as needed for chest pain. 04/13/19   Jake Bathe, MD  PRESCRIPTION MEDICATION CPAP- At bedtime and as needed during any time of rest    [provider]  torsemide (DEMADEX) 20 MG tablet Take 5 tablets (100 mg total) by mouth daily. 01/25/22   Kathlen Mody, MD  TRUE METRIX BLOOD GLUCOSE TEST test strip  09/18/17   [provider]  TRUEplus Lancets 28G MISC  11/24/19   [provider]  vitamin B-12 (CYANOCOBALAMIN) 1000 MCG tablet Take 1,000 mcg by mouth daily with lunch.    [provider]  Vitamin D3 (VITAMIN D) 25 MCG tablet Take 1,000 Units by mouth daily with lunch.  [provider]    Physical Exam: BP (!) 141/55   Pulse 62   Temp 98 F (36.7 C)   Resp 18   SpO2 100%   General: 82 y.o. year-old female well developed well nourished in no acute distress.  Alert and oriented x3. Cardiovascular: Regular rate and rhythm with no rubs or gallops.  No thyromegaly or JVD noted.  Trace lower extremity edema bilaterally. Respiratory: Clear to auscultation with no wheezes or rales. Good inspiratory effort. Abdomen: Soft nontender nondistended with normal bowel sounds x4 quadrants. Muskuloskeletal: No cyanosis or clubbing noted bilaterally Neuro: CN II-XII intact, strength, sensation, reflexes Skin: Left foot, 09/03/2022. Psychiatry:  Judgement and insight appear normal. Mood is appropriate for condition and setting          Labs on Admission:  Basic Metabolic Panel: Recent Labs  Lab 09/03/22 1747  NA 137  K 4.2  CL 97*  CO2 26  GLUCOSE 250*  BUN 54*  CREATININE 2.85*  CALCIUM 8.9   Liver Function Tests: Recent Labs  Lab 09/03/22 1747  AST 16  ALT 14  ALKPHOS 64  BILITOT 0.3  PROT 6.7  ALBUMIN 2.3*   No results for input(s): "LIPASE", "AMYLASE" in the last 168 hours. No results for input(s): "AMMONIA" in the last 168 hours. CBC: Recent Labs  Lab 09/03/22 1747  WBC 10.7*  NEUTROABS 7.9*  HGB 9.1*  HCT 29.4*  MCV 98.3  PLT 542*   Cardiac Enzymes: No results for input(s): "CKTOTAL", "CKMB", "CKMBINDEX", "TROPONINI" in the last 168 hours.  BNP (last 3 results) No results for input(s): "BNP" in the last 8760 hours.  ProBNP (last 3 results) Recent Labs    02/16/22 1008  PROBNP 699    CBG: No results for input(s): "GLUCAP" in the last 168 hours.  Radiological Exams on Admission: VAS [place] LOWER EXTREMITY VENOUS (DVT) (7a-7p)  Result Date: 09/03/2022  Lower Venous DVT Study Patient Name:  Kayla [name]  Date of Exam:   09/03/2022 Medical Rec #: [id number]         Accession #:    [id number] Date of Birth: 10/03/1940        Patient Gender: F Patient Age:   59 years Exam Location:  [facility] Procedure:      VAS [place] LOWER EXTREMITY VENOUS (DVT) Referring Phys: Maryanna Shape --------------------------------------------------------------------------------  Indications: Palpable varicosities, erythema in setting of concern for toe infection.  Anticoagulation: Eliquis. Limitations: Poor ultrasound/tissue interface and patient pain at calf. Comparison Study: No prior studies. Performing Technologist: Jean Rosenthal RDMS, RVT  Examination Guidelines: A complete evaluation includes B-mode imaging, spectral Doppler, color Doppler, and power Doppler as needed of all accessible portions of each vessel.  Bilateral testing is considered an integral part of a complete examination. Limited examinations for reoccurring indications may be performed as noted. The reflux portion of the exam is performed with the patient in reverse Trendelenburg.  +-----+---------------+---------+-----------+----------+--------------+ RIGHTCompressibilityPhasicitySpontaneityPropertiesThrombus Aging +-----+---------------+---------+-----------+----------+--------------+ CFV  Full           Yes      Yes                                 +-----+---------------+---------+-----------+----------+--------------+   +---------+---------------+---------+-----------+----------+-------------------+ LEFT     CompressibilityPhasicitySpontaneityPropertiesThrombus Aging      +---------+---------------+---------+-----------+----------+-------------------+ CFV      Full           Yes      Yes                                      +---------+---------------+---------+-----------+----------+-------------------+  SFJ      Full                                                             +---------+---------------+---------+-----------+----------+-------------------+ FV Prox  Full                                                             +---------+---------------+---------+-----------+----------+-------------------+ FV Mid   Full                                                             +---------+---------------+---------+-----------+----------+-------------------+ FV DistalFull                                                             +---------+---------------+---------+-----------+----------+-------------------+ PFV      Full                                                             +---------+---------------+---------+-----------+----------+-------------------+ POP      Full           Yes      Yes                                       +---------+---------------+---------+-----------+----------+-------------------+ PTV      Full                                                             +---------+---------------+---------+-----------+----------+-------------------+ PERO                                                  Not well visualized +---------+---------------+---------+-----------+----------+-------------------+ GSV      Full                                                             +---------+---------------+---------+-----------+----------+-------------------+    Summary: RIGHT: - No evidence of common femoral vein obstruction.  LEFT: - There  is no evidence of deep vein thrombosis in the lower extremity. However, portions of this examination were limited- see technologist comments above.  - No cystic structure found in the popliteal fossa.  *See table(s) above for measurements and observations.    Preliminary    DG Foot Complete Left  Result Date: 09/03/2022 Please see detailed radiograph report in office note.   EKG: I independently viewed the EKG done and my findings are as followed: Atrial paced rhythm rate of 67.  QTc 488.  Nonspecific ST-T changes.  Assessment/Plan Present on Admission:  Diabetic foot infection (HCC)  Principal Problem:   Diabetic foot infection (HCC)  Diabetic foot infection, left, POA Left great toe wound infection with concern for osteomyelitis, POA Per Podiatry, Left foot x-ray suggestive of osteomyelitis, POA Podiatry recommended MRI left foot to confirm osteomyelitis, ordered and pending. May require surgical procedure if osteomyelitis is confirmed. Patient is on home Eliquis, held and switched to heparin drip, due to possible surgical procedure. Continue IV antibiotics, Rocephin and IV Flagyl, add IV vancomycin until MRSA screening test is ruled out. Follow blood cultures As needed analgesics As needed antiemetics Monitor fever curve and WBCs. Podiatry, Dr.  Ardelle Anton, will see in consultation.  AKI on CKD 4, suspect prerenal in the setting of poor oral intake. Baseline creatinine appears to be 2.0 with GFR of 24 Presented with creatinine of 2.85 with GFR 16 Avoid nephrotoxic agents and hypotension Hold off home diuretics Monitor urine output Repeat BMP in the morning  Type 2 diabetes with hyperglycemia Hemoglobin A1c 8.4 on 05/30/2021 Heart healthy carb modified diet Insulin sliding scale.  Anemia of chronic disease Iron deficiency anemia Presented with hemoglobin of 9.1 Treat underlying conditions Maintain hemoglobin above 8.  History of sick sinus syndrome status post pacemaker placement No acute issues May require cardiology deactivation of pacemaker prior to MRI.  Paroxysmal A-fib on Eliquis Hold off home Eliquis Started heparin drip. Monitor on telemetry.  HFpEF 60 to 65% Grade 2 diastolic dysfunction Strict I's and O's and daily weight  Obesity Weight 107.5 kg Recommend weight loss outpatient with regular physical activity and healthy dieting.   DVT prophylaxis: Heparin drip.  Code Status: DNR, stated by the patient herself.  Family Communication: None at bedside.  Disposition Plan: Admitted to telemetry surgical unit.  Consults called: Podiatry consulted by EDP.  Admission status: Inpatient status.   Status is: Inpatient The patient requires at least 2 midnights for further evaluation and treatment of present condition.   Darlin Drop MD [facility] Pager [phone]  If 7PM-7AM, please contact night-coverage [email or web address] Password [email or web address]  09/04/2022, 12:04 AM

## 2022-09-04 NOTE — Consult Note (Signed)
Reason for Consult:Left foot ulceration, osteomyelitis Referring Physician: Dr. Lorin Glass, MD  Kayla [name] is an 82 y.o. female.  HPI:  82 y.o. female with medical history significant for uncontrolled type 2 diabetes, diabetic polyneuropathy, sick sinus syndrome status post pacemaker placement, hyperlipidemia, obesity, history of amputation of lesser toe of left foot, chronic venous insufficiency, COPD, gout, iron deficiency anemia, hypertension, paroxysmal A-fib on Eliquis, who presented to [facility] ED at the request of Dr. Annamary Rummage due to concern for left great toe diabetic foot infection and possible osteomyelitis seen on x-ray.  Podiatry recommended IV antibiotics and MRI to confirm osteomyelitis.   She has history of prior partial amputation of the toe with removal of the distal phalanx. This was done by Dr. Samuella Cota a couple years ago. She has noted she has had some skin trimmed down recently by Dr. Allena Katz from the left great toe and since then there has been some increasing drainage possibly purulence as well as redness and swelling of the left great toe. She has been on doxycycline as an outpatient.   Past Medical History:  Diagnosis Date   Allergy    Anxiety    Arthritis    CAD (coronary artery disease)    Mild per remote cath in 2006, /    Nuclear, January, 2013, no significant abnormality,   Carotid artery disease (HCC)    Doppler, January, 2013, 0 39% RIC A., 40-59% LICA, stable   Cataract    CHF (congestive heart failure) (HCC)    Chronic kidney disease    Complication of anesthesia    wakes up "mean"   COPD 02/16/2007   Intolerant of Spiriva, tudorza Intolerant of Trelegy    COPD (chronic obstructive pulmonary disease) (HCC)    Coronary atherosclerosis 11/24/2008   Catheterization 2006, nonobstructive coronary disease   //   nuclear 2013, low risk     Diabetes mellitus, type 2 (HCC)    Diverticula of colon    DIVERTICULOSIS OF COLON 03/23/2007   Qualifier: Diagnosis of   By: Craige Cotta MD, Vineet     Dizziness    occasional   Dizzy spells 05/04/2011   DM (diabetes mellitus) (HCC) 03/23/2007   Qualifier: Diagnosis of  By: Craige Cotta MD, Vineet     Dyspnea    On exertion   Ejection fraction    Essential hypertension 02/16/2007   Qualifier: Diagnosis of  By: Earlene Plater CMA, Tammy     G E R D 03/23/2007   Qualifier: Diagnosis of  By: Craige Cotta MD, Vineet     Gall bladder disease 04/28/2016   Gastritis and gastroduodenitis    GERD (gastroesophageal reflux disease)    Goiter    hx of   Headache(784.0)    migraine   Hyperlipidemia    Hypertension    Hypothyroidism    HYPOTHYROIDISM, POSTSURGICAL 06/04/2008   Qualifier: Diagnosis of  By: Everardo All MD, Sean A    Left ventricular hypertrophy    Leg edema    occasional swelling   Leg swelling 12/09/2018   Myofascial pain syndrome, cervical 01/06/2018   Obesity    OBESITY 11/24/2008   Qualifier: Diagnosis of  By: Vikki Ports     OBSTRUCTIVE SLEEP APNEA 02/16/2007   Auto CPAP 09/03/13 to 10/02/13 >> used on 30 of 30 nights with average 6 hrs and 3 min.  Average AHI is 3.1 with median CPAP 5 cm H2O and 95 th percentile CPAP 6 cm H2O.     Occipital neuralgia of right side 01/06/2018  OSA (obstructive sleep apnea)    cpap setting of 12   Pain in joint of right hip 01/21/2018   Palpitations 01/21/2011   48 hour Holter, 2015, scattered PACs and PVCs.    Paroxysmal atrial fibrillation (HCC)    Pneumonia    PONV (postoperative nausea and vomiting)    severe after every surgery   Pulmonary hypertension, secondary    RHINITIS 07/21/2010        RUQ abdominal pain    S/P left TKA 07/27/2011   Sinus node dysfunction (HCC) 11/08/2015   Sleep apnea    Spinal stenosis of cervical region 11/07/2014   Urinary frequency 12/09/2018   UTI (urinary tract infection) 08/15/2019   per pt report   VENTRICULAR HYPERTROPHY, LEFT 11/24/2008   Qualifier: Diagnosis of  By: Vikki Ports      Past Surgical History:  Procedure Laterality  Date   ABDOMINAL HYSTERECTOMY     with appendectomy and colon polypectomy   AMPUTATION TOE Left 12/15/2019   Procedure: AMPUTATION  LEFT GREAT TOE;  Surgeon: Park Liter, DPM;  Location: [facility];  Service: Podiatry;  Laterality: Left;   AMPUTATION TOE Left 01/22/2022   Procedure: AMPUTATION TOE 4th digit;  Surgeon: Candelaria Stagers, DPM;  Location: [facility];  Service: Podiatry;  Laterality: Left;   ANTERIOR CERVICAL DECOMP/DISCECTOMY FUSION N/A 11/07/2014   Procedure: Anterior Cervical diskectomy and fusion Cervical four-five, Cervical five-six, Cervical six-seven;  Surgeon: Donalee Citrin, MD;  Location: [facility];  Service: Neurosurgery;  Laterality: N/A;   APPENDECTOMY     ARTERY BIOPSY Right 02/16/2017   Procedure: BIOPSY TEMPORAL ARTERY;  Surgeon: Renford Dills, MD;  Location: [facility];  Service: Vascular;  Laterality: Right;   ARTERY BIOPSY Left 04/16/2020   Procedure: BIOPSY TEMPORAL ARTERY;  Surgeon: Renford Dills, MD;  Location: [facility];  Service: Vascular;  Laterality: Left;  LEFT TEMPLE   BACK SURGERY     CARDIAC CATHETERIZATION     CATARACT EXTRACTION W/ INTRAOCULAR LENS  IMPLANT, BILATERAL Bilateral    CHOLECYSTECTOMY N/A 04/29/2016   Procedure: LAPAROSCOPIC CHOLECYSTECTOMY WITH INTRAOPERATIVE CHOLANGIOGRAM;  Surgeon: Ovidio Kin, MD;  Location: [facility];  Service: General;  Laterality: N/A;   EP IMPLANTABLE DEVICE N/A 11/08/2015   MRI COMPATABLE -- Procedure: Pacemaker Implant;  Surgeon: Duke Salvia, MD;  Location: [facility];  Service: Cardiovascular;  Laterality: N/A;   INCISION AND DRAINAGE ABSCESS Right 05/30/2021   Procedure: IRRIGATION AND DEBRIDEMENT OF PREPATELLA BURSA;  Surgeon: Durene Romans, MD;  Location: [facility];  Service: Orthopedics;  Laterality: Right;  60 wants standard knee draping and pulse lavage   INCISION AND DRAINAGE ABSCESS Right 07/22/2021   Procedure: INCISION AND DRAINAGE ABSCESS RIGHT KNEE;  Surgeon: Durene Romans, MD;  Location: [facility];   Service: Orthopedics;  Laterality: Right;   INSERT / REPLACE / REMOVE PACEMAKER  2017   Dr. Clide Cliff [facility])   IR FLUORO GUIDE CV LINE RIGHT  07/25/2021   IR REMOVAL TUN CV CATH W/O FL  09/04/2021   IR US GUIDE VASC ACCESS RIGHT  07/25/2021   JOINT REPLACEMENT     LUMBAR DISC SURGERY  1990's   x 2   THYROIDECTOMY  2011   TOTAL KNEE ARTHROPLASTY  07/27/2011   Procedure: TOTAL KNEE ARTHROPLASTY;  Surgeon: Shelda Pal, MD;  Location: [facility];  Service: Orthopedics;  Laterality: Left;   UPPER ESOPHAGEAL ENDOSCOPIC ULTRASOUND (EUS) N/A 05/14/2016   Procedure: UPPER ESOPHAGEAL ENDOSCOPIC ULTRASOUND (EUS);  Surgeon: Rachael Fee, MD;  Location: [facility];  Service: Endoscopy;  Laterality: N/A;  radial only-will be done mod if needed    Family History  Problem Relation Age of Onset   Heart Problems Mother    Heart defect Mother        valve problems   Heart Problems Father    Heart disease Brother    Lung cancer Daughter        non small cell    Diabetes Maternal Aunt    Diabetes Maternal Uncle    Throat cancer Brother    Colon cancer Neg Hx    Colon polyps Neg Hx    Esophageal cancer Neg Hx    Rectal cancer Neg Hx    Stomach cancer Neg Hx     Social History:  reports that she quit smoking about 25 years ago. Her smoking use included cigarettes. She started smoking about 58 years ago. She has a 66.00 pack-year smoking history. She has never used smokeless tobacco. She reports that she does not drink alcohol and does not use drugs.  Allergies:  Allergies  Allergen Reactions   Dulaglutide Nausea Only, Other (See Comments) and Cough    TRULICITY made the patient sick to her stomach (only) several days after using it   Oxycodone Itching and Other (See Comments)    Pt still takes this med even thought it causes itching   Trelegy Ellipta [Fluticasone-Umeclidin-Vilant] Nausea And Vomiting, Other (See Comments) and Cough    Made the patient sick to her stomach (only) several days  after using it     Medications: I have reviewed the patient's current medications.  Results for orders placed or performed during the hospital encounter of 09/03/22 (from the past 48 hour(s))  Comprehensive metabolic panel     Status: Abnormal   Collection Time: 09/03/22  5:47 PM  Result Value Ref Range   Sodium 137 135 - 145 mmol/L   Potassium 4.2 3.5 - 5.1 mmol/L   Chloride 97 (L) 98 - 111 mmol/L   CO2 26 22 - 32 mmol/L   Glucose, Bld 250 (H) 70 - 99 mg/dL    Comment: Glucose reference range applies only to samples taken after fasting for at least 8 hours.   BUN 54 (H) 8 - 23 mg/dL   Creatinine, Ser 4.09 (H) 0.44 - 1.00 mg/dL   Calcium 8.9 8.9 - 81.1 mg/dL   Total Protein 6.7 6.5 - 8.1 g/dL   Albumin 2.3 (L) 3.5 - 5.0 g/dL   AST 16 15 - 41 U/L   ALT 14 0 - 44 U/L   Alkaline Phosphatase 64 38 - 126 U/L   Total Bilirubin 0.3 0.3 - 1.2 mg/dL   GFR, Estimated 16 (L) >60 mL/min    Comment: (NOTE) Calculated using the CKD-EPI Creatinine Equation (2021)    Anion gap 14 5 - 15    Comment: Performed at [facility], [address], [place], [place] [address]  CBC with Differential     Status: Abnormal   Collection Time: 09/03/22  5:47 PM  Result Value Ref Range   WBC 10.7 (H) 4.0 - 10.5 K/uL   RBC 2.99 (L) 3.87 - 5.11 MIL/uL   Hemoglobin 9.1 (L) 12.0 - 15.0 g/dL   HCT 29.5 (L) 62.1 - 30.8 %   MCV 98.3 80.0 - 100.0 fL   MCH 30.4 26.0 - 34.0 pg   MCHC 31.0 30.0 - 36.0 g/dL   RDW 14.0  11.5 - 15.5 %   Platelets 542 (H) 150 - 400 K/uL   nRBC 0.0 0.0 - 0.2 %   Neutrophils Relative % 74 %   Neutro Abs 7.9 (H) 1.7 - 7.7 K/uL   Lymphocytes Relative 15 %   Lymphs Abs 1.6 0.7 - 4.0 K/uL   Monocytes Relative 7 %   Monocytes Absolute 0.8 0.1 - 1.0 K/uL   Eosinophils Relative 2 %   Eosinophils Absolute 0.2 0.0 - 0.5 K/uL   Basophils Relative 1 %   Basophils Absolute 0.1 0.0 - 0.1 K/uL   Immature Granulocytes 1 %   Abs Immature Granulocytes 0.13 (H) 0.00 - 0.07 K/uL    Comment:  Performed at [facility], [address] [place], [place] [address]  Lactic acid, plasma     Status: None   Collection Time: 09/03/22  5:47 PM  Result Value Ref Range   Lactic Acid, Venous 1.9 0.5 - 1.9 mmol/L    Comment: Performed at [facility], [address], [place], [place] [address]  Lactic acid, plasma     Status: None   Collection Time: 09/03/22 10:45 PM  Result Value Ref Range   Lactic Acid, Venous 1.6 0.5 - 1.9 mmol/L    Comment: Performed at [facility], [address], [place], [place] [address]  Glucose, capillary     Status: Abnormal   Collection Time: 09/04/22  1:06 AM  Result Value Ref Range   Glucose-Capillary 185 (H) 70 - 99 mg/dL    Comment: Glucose reference range applies only to samples taken after fasting for at least 8 hours.  APTT     Status: None   Collection Time: 09/04/22  1:28 AM  Result Value Ref Range   aPTT 32 24 - 36 seconds    Comment: Performed at [facility], [address] [place], [place] [address]  Protime-INR     Status: Abnormal   Collection Time: 09/04/22  1:28 AM  Result Value Ref Range   Prothrombin Time 15.5 (H) 11.4 - 15.2 seconds   INR 1.2 0.8 - 1.2    Comment: (NOTE) INR goal varies based on device and disease states. Performed at [facility], [address], [place], [place] [address]   Hemoglobin A1c     Status: Abnormal   Collection Time: 09/04/22  1:29 AM  Result Value Ref Range   Hgb A1c MFr Bld 6.8 (H) 4.8 - 5.6 %    Comment: (NOTE) Pre diabetes:          5.7%-6.4%  Diabetes:              >6.4%  Glycemic control for   <7.0% adults with diabetes    Mean Plasma Glucose 148.46 mg/dL    Comment: Performed at [facility], [address] [place], [place] [address]  CBC     Status: Abnormal   Collection Time: 09/04/22  1:29 AM  Result Value Ref Range   WBC 11.2 (H) 4.0 - 10.5 K/uL   RBC 2.75 (L) 3.87 - 5.11 MIL/uL   Hemoglobin 8.6 (L) 12.0 - 15.0 g/dL   HCT 95.2 (L) 84.1 -  46.0 %   MCV 96.7 80.0 - 100.0 fL   MCH 31.3 26.0 - 34.0 pg   MCHC 32.3 30.0 - 36.0 g/dL   RDW 32.4 40.1 - 02.7 %   Platelets 521 (H) 150 - 400 K/uL   nRBC 0.0 0.0 -  0.2 %    Comment: Performed at [facility], [address] [place], [place] [address]  Basic metabolic panel     Status: Abnormal   Collection Time: 09/04/22  1:29 AM  Result Value Ref Range   Sodium 140 135 - 145 mmol/L   Potassium 4.0 3.5 - 5.1 mmol/L   Chloride 98 98 - 111 mmol/L   CO2 29 22 - 32 mmol/L   Glucose, Bld 191 (H) 70 - 99 mg/dL    Comment: Glucose reference range applies only to samples taken after fasting for at least 8 hours.   BUN 60 (H) 8 - 23 mg/dL   Creatinine, Ser 6.04 (H) 0.44 - 1.00 mg/dL   Calcium 8.6 (L) 8.9 - 10.3 mg/dL   GFR, Estimated 16 (L) >60 mL/min    Comment: (NOTE) Calculated using the CKD-EPI Creatinine Equation (2021)    Anion gap 13 5 - 15    Comment: Performed at [facility], [address] [place], [place] [address]  Magnesium     Status: Abnormal   Collection Time: 09/04/22  1:29 AM  Result Value Ref Range   Magnesium 1.6 (L) 1.7 - 2.4 mg/dL    Comment: Performed at [facility], [address], [place], [place] [address]  Phosphorus     Status: None   Collection Time: 09/04/22  1:29 AM  Result Value Ref Range   Phosphorus 4.4 2.5 - 4.6 mg/dL    Comment: Performed at [facility], [address] [place], [place] [address]  Heparin level (unfractionated)     Status: Abnormal   Collection Time: 09/04/22  1:29 AM  Result Value Ref Range   Heparin Unfractionated >1.10 (H) 0.30 - 0.70 IU/mL    Comment: (NOTE) The clinical reportable range upper limit is being lowered to >1.10 to align with the FDA approved guidance for the current laboratory assay.  If heparin results are below expected values, and patient dosage has  been confirmed, suggest follow up testing of antithrombin III levels. Performed at [facility], [address],  [place], [place] [address]   Surgical pcr screen     Status: Abnormal   Collection Time: 09/04/22  1:39 AM   Specimen: Nasal Mucosa; Nasal Swab  Result Value Ref Range   MRSA, PCR POSITIVE (A) NEGATIVE    Comment: RESULT CALLED TO, READ BACK BY AND VERIFIED WITH: A CRUZ RN 09/04/22 @ 0333 BY AB    Staphylococcus aureus POSITIVE (A) NEGATIVE    Comment: (NOTE) The Xpert SA Assay (FDA approved for NASAL specimens in patients 5 years of age and older), is one component of a comprehensive surveillance program. It is not intended to diagnose infection nor to guide or monitor treatment. Performed at [facility], [address] [place], [place] [address]     VAS [place] LOWER EXTREMITY VENOUS (DVT) (7a-7p)  Result Date: 09/03/2022  Lower Venous DVT Study Patient Name:  DAYTONA STANFORTH  Date of Exam:   09/03/2022 Medical Rec #: [id number]         Accession #:    [id number] Date of Birth: 11/07/1940        Patient Gender: F Patient Age:   35 years Exam Location:  [facility] Procedure:      VAS [place] LOWER EXTREMITY VENOUS (DVT) Referring Phys: Maryanna Shape --------------------------------------------------------------------------------  Indications: Palpable varicosities, erythema in setting of concern for toe infection.  Anticoagulation: Eliquis.  Limitations: Poor ultrasound/tissue interface and patient pain at calf. Comparison Study: No prior studies. Performing Technologist: Jean Rosenthal RDMS, RVT  Examination Guidelines: A complete evaluation includes B-mode imaging, spectral Doppler, color Doppler, and power Doppler as needed of all accessible portions of each vessel. Bilateral testing is considered an integral part of a complete examination. Limited examinations for reoccurring indications may be performed as noted. The reflux portion of the exam is performed with the patient in reverse Trendelenburg.  +-----+---------------+---------+-----------+----------+--------------+  RIGHTCompressibilityPhasicitySpontaneityPropertiesThrombus Aging +-----+---------------+---------+-----------+----------+--------------+ CFV  Full           Yes      Yes                                 +-----+---------------+---------+-----------+----------+--------------+   +---------+---------------+---------+-----------+----------+-------------------+ LEFT     CompressibilityPhasicitySpontaneityPropertiesThrombus Aging      +---------+---------------+---------+-----------+----------+-------------------+ CFV      Full           Yes      Yes                                      +---------+---------------+---------+-----------+----------+-------------------+ SFJ      Full                                                             +---------+---------------+---------+-----------+----------+-------------------+ FV Prox  Full                                                             +---------+---------------+---------+-----------+----------+-------------------+ FV Mid   Full                                                             +---------+---------------+---------+-----------+----------+-------------------+ FV DistalFull                                                             +---------+---------------+---------+-----------+----------+-------------------+ PFV      Full                                                             +---------+---------------+---------+-----------+----------+-------------------+ POP      Full           Yes      Yes                                      +---------+---------------+---------+-----------+----------+-------------------+  PTV      Full                                                             +---------+---------------+---------+-----------+----------+-------------------+ PERO                                                  Not well visualized  +---------+---------------+---------+-----------+----------+-------------------+ GSV      Full                                                             +---------+---------------+---------+-----------+----------+-------------------+    Summary: RIGHT: - No evidence of common femoral vein obstruction.  LEFT: - There is no evidence of deep vein thrombosis in the lower extremity. However, portions of this examination were limited- see technologist comments above.  - No cystic structure found in the popliteal fossa.  *See table(s) above for measurements and observations.    Preliminary    DG Foot Complete Left  Result Date: 09/03/2022 Please see detailed radiograph report in office note.   Review of Systems Blood pressure (!) 152/55, pulse 62, temperature 98.3 F (36.8 C), temperature source Oral, resp. rate 18, weight 100.5 kg, SpO2 100 %. Physical Exam General: AAO x3, NAD  Dermatological: Ulceration present left distal hallux with edema and erythema present.  There is currently no drainage or pus but there was some fresh blood on the bandage.  Minimal bruising present along the first interspace.  No pain.  No recent injury she reports.  No open lesion to this area.   Vascular: Dorsalis Pedis artery and Posterior Tibial artery pedal pulses are 1/4 bilateral with immedate capillary fill time.There is no pain with calf compression, swelling, warmth, erythema.   Neruologic: Sensation decreased  Musculoskeletal: Previous partial left hallux amputation.  No pain on exam.   Assessment/Plan:  Left hallux osteomyelitis, cellulitis  -X-rays independently reviewed and there is cortical changes consistent with osteomyelitis.  MRI is pending. -Consider vascular surgery consultation -I discussed the infection and discussed amputation versus conservative treatment.  Patient states that originally she thought the entire toes can be amputated.  We discussed amputation of the remaining aspect of the  hallux and she agrees with this.  We discussed the procedure as well as postoperative course including alternatives, risks, complications.  Patient is pending MRI however.  Will tentatively plan for surgery around 5 PM today.  N.p.o. after 9 AM. -Continue antibiotics-currently on vancomycin, metronidazole, ceftriaxone. -Podiatry will continue to follow  Ovid Curd, DPM      Vivi Barrack 09/04/2022, 6:42 AM

## 2022-09-04 NOTE — Progress Notes (Signed)
Per order, Changed device settings for MRI to  DOO at 80 bpm  MRI mode  Will program device back to pre-MRI settings after completion of exam, and send transmission 

## 2022-09-04 NOTE — Progress Notes (Addendum)
Pt arrived to unit @0115am  accompanied by 2 techs, A&Ox4, in a pleasant mood, rates pain an 8 on left great toe. Redness and swelling noted on left great toe, pt requested for toe to be wrapped due to bleeding when placing pressure on affected foot . Toe cleansed and placed dressing. 3L of O2 at baseline, pace maker on left upper chest,  Orders initiated, all questions answered. Call bell left within reach and was encouraged to call when needed.

## 2022-09-04 NOTE — Progress Notes (Signed)
Lab called to notify pt's PCR tested positive for MRSA in nares. Protocol order placed

## 2022-09-04 NOTE — Transfer of Care (Signed)
Immediate Anesthesia Transfer of Care Note  Patient: Kayla [name]  Procedure(s) Performed: LEFT BIG TOE AMPUTAION (Left: Toe)  Patient Location: PACU  Anesthesia Type:General  Level of Consciousness: awake and alert   Airway & Oxygen Therapy: Patient Spontanous Breathing  Post-op Assessment: Report given to RN and Post -op Vital signs reviewed and stable  Post vital signs: Reviewed and stable  Last Vitals:  Vitals Value Taken Time  BP 118/41 09/04/22 1818  Temp 98   Pulse 60 09/04/22 1821  Resp 18 09/04/22 1821  SpO2 99 % 09/04/22 1821  Vitals shown include unvalidated device data.  Last Pain:  Vitals:   09/04/22 1658  TempSrc: Oral  PainSc:          Complications: No notable events documented.

## 2022-09-04 NOTE — Op Note (Signed)
  PATIENT:  Kayla [name]  82 y.o. female  PRE-OPERATIVE DIAGNOSIS:  osteomyelitis  POST-OPERATIVE DIAGNOSIS:  osteomyelitis  PROCEDURE:  Left partial 1st ray amputation   SURGEON:  Surgeon(s) and Role:    * Vivi Barrack, DPM - Primary  PHYSICIAN ASSISTANT:   ASSISTANTS: none   ANESTHESIA:   local and MAC  EBL:  25 mL   BLOOD ADMINISTERED:none  DRAINS: none   LOCAL MEDICATIONS USED:  MARCAINE   , BUPIVICAINE , and Amount: 20 ml  SPECIMEN:  Source of Specimen:  bone for pathology, wound culture  DISPOSITION OF SPECIMEN:  PATHOLOGY  COUNTS:  YES  TOURNIQUET:  * No tourniquets in log *  DICTATION: .Dragon Dictation  PLAN OF CARE: Admit to inpatient   PATIENT DISPOSITION:  PACU - hemodynamically stable.   Delay start of Pharmacological VTE agent (>24hrs) due to surgical blood loss or risk of bleeding: no  Intraoperative findings: Toe was disarticulated.  Due to removal of infected soft tissue not enough skin for closure so proceeded with resection of the first metatarsal head.  No proximal tracking or purulence.  Indications for surgery: 82 year old female presents to the hospital with concerns of worsening infection in her left foot from clinic.  MRI confirmed osteomyelitis.  We discussed try to salvage the toe versus amputation I recommend amputation given the infection and she agrees with this.  Alternatives risks and complications were discussed.  No promises or guarantees given aspect of the procedure and all questions were answered to the best my ability.  Procedure in detail: The patient's with verbally and visually identified by myself, nursing staff, the anesthesia staff preoperatively.  She was then transferred to the operating room via stretcher with the surgery took place.  After an adequate plane of anesthesia was obtained and timeout was performed a mixture of 10 mL lidocaine, Marcaine plain was infiltrated in a regional block fashion.  The left  lower extremities and scrubbed prepped and draped in normal sterile fashion.  Secondary timeout was performed.  At this time a modified racquet type incision was made on the first MPJ with a #15 scalpel from skin to bone.  The MPJ was identified the toe was disarticulated.  I made sure to make the incision proximal to where there was noticeable infection.  Once the toe was removed metatarsal head appeared to be viable however there is not enough skin coverage in order to close the wound as I resected the tissue that was infected.  I utilized a sagittal saw to resect the back of the first metatarsal and remove the sesamoid that was present.  Bone was sent to pathology.  Wound culture was obtained.  There was some osseous fragments on the first interspace which I debrided with a rongeur.  There is no purulence or proximal tracking.  Debrided the soft tissue.  At this time I copiously irrigated the incision with saline hemostasis was achieved.  Incision was then closed with 3-0 Prolene.  Of note there is found to be a skin tear on the plantar aspect of the foot which I repaired with the Prolene as well.  Xeroform was applied followed by dry sterile dressing.  She was transferred to PACU vital signs stable and vascular status intact.  Post operative course: Recommend remain inpatient IV antibiotics for 24 to 48 hours and likely discharge over the weekend.  Will plan to change the dressing tomorrow.  Weight-bear as tolerated in surgical shoe starting tomorrow.

## 2022-09-04 NOTE — Anesthesia Preprocedure Evaluation (Addendum)
Anesthesia Evaluation  Patient identified by MRN, date of birth, ID band Patient awake    Reviewed: Allergy & Precautions, NPO status , Patient's Chart, lab work & pertinent test results, reviewed documented beta blocker date and time   History of Anesthesia Complications (+) PONV and history of anesthetic complications  Airway Mallampati: II  TM Distance: >3 FB Neck ROM: Full    Dental  (+) Poor Dentition, Missing, Dental Advisory Given   Pulmonary shortness of breath, sleep apnea, Continuous Positive Airway Pressure Ventilation and Oxygen sleep apnea , COPD,  COPD inhaler and oxygen dependent, former smoker   breath sounds clear to auscultation       Cardiovascular hypertension, Pt. on medications and Pt. on home beta blockers (-) angina + CAD (non-obstructive)  + dysrhythmias Atrial Fibrillation + pacemaker  Rhythm:Regular Rate:Normal  '22 ECHO: EF 60-65%. The LV has normal function, no regional wall motion abnormalities. There is severe concentric LVH. Grade II DD, normal RVF, no significant valvular abnormalities   Neuro/Psych  Headaches  Anxiety        GI/Hepatic Neg liver ROS,GERD  Controlled,,  Endo/Other  diabetes, Insulin DependentHypothyroidism  BMI 41  Renal/GU Renal InsufficiencyRenal disease     Musculoskeletal  (+) Arthritis ,    Abdominal  (+) + obese  Peds  Hematology  (+) Blood dyscrasia (Hb 8.8), anemia eliquis   Anesthesia Other Findings   Reproductive/Obstetrics                             Anesthesia Physical Anesthesia Plan  ASA: 4  Anesthesia Plan: MAC   Post-op Pain Management: Tylenol PO (pre-op)*   Induction: Intravenous  PONV Risk Score and Plan: 3 and Ondansetron, Dexamethasone and Treatment may vary due to age or medical condition  Airway Management Planned: Simple Face Mask and Natural Airway  Additional Equipment: None  Intra-op Plan:    Post-operative Plan:   Informed Consent: I have reviewed the patients History and Physical, chart, labs and discussed the procedure including the risks, benefits and alternatives for the proposed anesthesia with the patient or authorized representative who has indicated his/her understanding and acceptance.   Patient has DNR.  Discussed DNR with patient and Continue DNR.   Dental advisory given  Plan Discussed with: CRNA and Surgeon  Anesthesia Plan Comments:         Anesthesia Quick Evaluation

## 2022-09-04 NOTE — Progress Notes (Signed)
ANTICOAGULATION CONSULT NOTE - Initial Consult  Pharmacy Consult for Heparin Indication: atrial fibrillation  Allergies  Allergen Reactions   Dulaglutide Nausea Only, Other (See Comments) and Cough    TRULICITY made the patient sick to her stomach (only) several days after using it   Oxycodone Itching and Other (See Comments)    Pt still takes this med even thought it causes itching   Trelegy Ellipta [Fluticasone-Umeclidin-Vilant] Nausea And Vomiting, Other (See Comments) and Cough    Made the patient sick to her stomach (only) several days after using it     Patient Measurements:   Heparin Dosing Weight: 80 kg  Vital Signs: Temp: 98.3 F (36.8 C) (05/03 0120) Temp Source: Oral (05/03 0120) BP: 152/55 (05/03 0120) Pulse Rate: 62 (05/03 0120)  Labs: Recent Labs    09/03/22 1747 09/04/22 0128 09/04/22 0129  HGB 9.1*  --  8.6*  HCT 29.4*  --  26.6*  PLT 542*  --  521*  APTT  --  32  --   LABPROT  --  15.5*  --   INR  --  1.2  --   HEPARINUNFRC  --   --  >1.10*  CREATININE 2.85*  --  2.94*    CrCl cannot be calculated (Unknown ideal weight.).   Medical History: Past Medical History:  Diagnosis Date   Allergy    Anxiety    Arthritis    CAD (coronary artery disease)    Mild per remote cath in 2006, /    Nuclear, January, 2013, no significant abnormality,   Carotid artery disease (HCC)    Doppler, January, 2013, 0 39% RIC A., 40-59% LICA, stable   Cataract    CHF (congestive heart failure) (HCC)    Chronic kidney disease    Complication of anesthesia    wakes up "mean"   COPD 02/16/2007   Intolerant of Spiriva, tudorza Intolerant of Trelegy    COPD (chronic obstructive pulmonary disease) (HCC)    Coronary atherosclerosis 11/24/2008   Catheterization 2006, nonobstructive coronary disease   //   nuclear 2013, low risk     Diabetes mellitus, type 2 (HCC)    Diverticula of colon    DIVERTICULOSIS OF COLON 03/23/2007   Qualifier: Diagnosis of  By: Craige Cotta MD,  Vineet     Dizziness    occasional   Dizzy spells 05/04/2011   DM (diabetes mellitus) (HCC) 03/23/2007   Qualifier: Diagnosis of  By: Craige Cotta MD, Vineet     Dyspnea    On exertion   Ejection fraction    Essential hypertension 02/16/2007   Qualifier: Diagnosis of  By: Earlene Plater CMA, Tammy     G E R D 03/23/2007   Qualifier: Diagnosis of  By: Craige Cotta MD, Vineet     Gall bladder disease 04/28/2016   Gastritis and gastroduodenitis    GERD (gastroesophageal reflux disease)    Goiter    hx of   Headache(784.0)    migraine   Hyperlipidemia    Hypertension    Hypothyroidism    HYPOTHYROIDISM, POSTSURGICAL 06/04/2008   Qualifier: Diagnosis of  By: Everardo All MD, Sean A    Left ventricular hypertrophy    Leg edema    occasional swelling   Leg swelling 12/09/2018   Myofascial pain syndrome, cervical 01/06/2018   Obesity    OBESITY 11/24/2008   Qualifier: Diagnosis of  By: Vikki Ports     OBSTRUCTIVE SLEEP APNEA 02/16/2007   Auto CPAP 09/03/13 to 10/02/13 >> used on 30  of 30 nights with average 6 hrs and 3 min.  Average AHI is 3.1 with median CPAP 5 cm H2O and 95 th percentile CPAP 6 cm H2O.     Occipital neuralgia of right side 01/06/2018   OSA (obstructive sleep apnea)    cpap setting of 12   Pain in joint of right hip 01/21/2018   Palpitations 01/21/2011   48 hour Holter, 2015, scattered PACs and PVCs.    Paroxysmal atrial fibrillation (HCC)    Pneumonia    PONV (postoperative nausea and vomiting)    severe after every surgery   Pulmonary hypertension, secondary    RHINITIS 07/21/2010        RUQ abdominal pain    S/P left TKA 07/27/2011   Sinus node dysfunction (HCC) 11/08/2015   Sleep apnea    Spinal stenosis of cervical region 11/07/2014   Urinary frequency 12/09/2018   UTI (urinary tract infection) 08/15/2019   per pt report   VENTRICULAR HYPERTROPHY, LEFT 11/24/2008   Qualifier: Diagnosis of  By: Vikki Ports      Medications:  Medications Prior to Admission  Medication  Sig Dispense Refill Last Dose   acetaminophen (TYLENOL) 500 MG tablet Take 1,000-1,500 mg by mouth every 8 (eight) hours as needed for mild pain, moderate pain or headache.      albuterol (PROVENTIL) (2.5 MG/3ML) 0.083% nebulizer solution Take 3 mLs (2.5 mg total) by nebulization every 2 (two) hours as needed for wheezing or shortness of breath. 75 mL 5    albuterol (VENTOLIN HFA) 108 (90 Base) MCG/ACT inhaler Inhale 2 puffs into the lungs every 6 (six) hours as needed for wheezing or shortness of breath. 3 each 3    Alcohol Swabs (B-D SINGLE USE SWABS REGULAR) PADS       allopurinol (ZYLOPRIM) 100 MG tablet Take 100 mg by mouth 2 (two) times daily.      amiodarone (PACERONE) 200 MG tablet Take 1 tablet (200 mg total) by mouth daily. 90 tablet 3    amLODipine (NORVASC) 5 MG tablet Take 1 tablet (5 mg total) by mouth daily. 90 tablet 3    ascorbic acid (VITAMIN C) 500 MG tablet Take 500 mg by mouth daily with lunch.      atorvastatin (LIPITOR) 40 MG tablet Take 1 tablet (40 mg total) by mouth daily. 90 tablet 3    Blood Glucose Calibration (TRUE METRIX LEVEL 1) Low SOLN       Budeson-Glycopyrrol-Formoterol (BREZTRI AEROSPHERE) 160-9-4.8 MCG/ACT AERO Inhale 2 puffs into the lungs in the morning and at bedtime. 3 each 10    Budeson-Glycopyrrol-Formoterol (BREZTRI AEROSPHERE) 160-9-4.8 MCG/ACT AERO Inhale 2 puffs into the lungs in the morning and at bedtime. 5.9 g 0    Carboxymethylcellulose Sodium (ARTIFICIAL TEARS OP) Place 1 drop into both eyes in the morning and at bedtime.      chlorthalidone (HYGROTON) 25 MG tablet Take 25 mg by mouth daily.      collagenase (SANTYL) 250 UNIT/GM ointment Apply 1 Application topically daily. 15 g 0    ELIQUIS 2.5 MG TABS tablet TAKE 1 TABLET TWICE DAILY 180 tablet 3    ferrous sulfate 325 (65 FE) MG tablet Take 1 tablet (325 mg total) by mouth daily with breakfast. 90 tablet 3    fluticasone (FLONASE) 50 MCG/ACT nasal spray USE 2 SPRAYS NASALLY DAILY 48 g 1     gabapentin (NEURONTIN) 800 MG tablet Take 800 mg by mouth See admin instructions. Take 800 mg by mouth  three to four times a day      HYDROcodone-acetaminophen (NORCO) 10-325 MG tablet Take 1 tablet by mouth See admin instructions. Take 1 tablet by mouth three to four times a day      insulin aspart (NOVOLOG) 100 UNIT/ML FlexPen Inject 10-17 Units into the skin See admin instructions. Inject 10-17 units into the skin three times a day with meals "as needed," per SLIDING SCALE      insulin glargine (LANTUS) 100 UNIT/ML injection Inject 0.55 mLs (55 Units total) into the skin 2 (two) times daily. (Patient taking differently: Inject 50-90 Units into the skin 2 (two) times daily after a meal.) 10 mL 11    levothyroxine (SYNTHROID) 200 MCG tablet Take 200 mcg by mouth daily before breakfast.      losartan (COZAAR) 25 MG tablet Take 25 mg by mouth daily.      metolazone (ZAROXOLYN) 5 MG tablet Take one tablet by mouth daily for 3 days 30 minutes before taking your torsemide. 3 tablet 0    metoprolol succinate (TOPROL-XL) 100 MG 24 hr tablet TAKE 1 TABLET EVERY DAY WITH OR IMMEDIATELY AFTER A MEAL 90 tablet 3    naloxone (NARCAN) nasal spray 4 mg/0.1 mL Place 4 mg into the nose once as needed for opioid reversal.      nitroGLYCERIN (NITROSTAT) 0.4 MG SL tablet Place 1 tablet (0.4 mg total) under the tongue every 5 (five) minutes as needed for chest pain. 25 tablet 4    PRESCRIPTION MEDICATION CPAP- At bedtime and as needed during any time of rest      torsemide (DEMADEX) 20 MG tablet Take 5 tablets (100 mg total) by mouth daily. 135 tablet 3    TRUE METRIX BLOOD GLUCOSE TEST test strip       TRUEplus Lancets 28G MISC       vitamin B-12 (CYANOCOBALAMIN) 1000 MCG tablet Take 1,000 mcg by mouth daily with lunch.      Vitamin D3 (VITAMIN D) 25 MCG tablet Take 1,000 Units by mouth daily with lunch.       Assessment: 82 y.o. female admitted with L foot wound/osteomyelitis, h/o Afib and Eliquis on hold, for  heparin.  Last dose of Eliquis ~0800 5/2  Goal of Therapy:  aPTT 66-102 sec Heparin level 0.3-0.7 units/ml Monitor platelets by anticoagulation protocol: Yes   Plan:  Start heparin 1200 units/hr Check aPTT in 8 hours   Maggie Dworkin, Gary Fleet 09/04/2022,2:21 AM

## 2022-09-04 NOTE — Progress Notes (Signed)
PROGRESS NOTE  Kayla [name]  DOB: 1940/11/16  PCP: Ailene Ravel, MD AVW:[id number]  DOA: 09/03/2022  LOS: 1 day  Hospital Day: 2  Brief narrative: Kayla [name] is a 82 y.o. female with PMH significant for obesity, OSA, DM2, HTN, HLD, CAD, CHF, paroxysmal A-fib on Eliquis, SSS s/p PPM, carotid artery disease, CKD, chronic anemia, COPD, anxiety/depression, GERD, hypothyroidism, diabetic polyneuropathy, chronic venous insufficiency, h/o left toe amputation 5/2, patient was sent to the ED by her podiatrist Dr. Annamary Rummage with concern of left great toe diabetic foot infection possible osteomyelitis seen on x-ray.  In the ED, patient is afebrile, blood pressure 121/48, breathing on 3 L oxygen by nasal cannula Initial labs with WC count 10.7, hemoglobin 9.1, lactic acid 1.1, creatinine 2.85, glucose 250 MRI foot pending. Started on burst of IV antibiotics Admitted to [facility]. Podiatry consulted.  Subjective: Patient was seen and examined this morning.  Pleasant elderly Caucasian female.  Comfortable in bed.  Not in distress.  No new symptoms.  Husband at bedside. Chart reviewed Labs this morning with creatinine at 2.94, hemoglobin 8.6, glucose level 262  Assessment and plan: Left diabetic foot infection  Left great toe wound infection with concern for osteomyelitis, POA Seen by podiatry.  Pending MRI.  Tentatively planned for amputation. Vascular surgery consultation called. Currently on IV antibiotic coverage with IV Rocephin, IV Flagyl, IV vancomycin Continue pain meds   AKI on CKD 4 suspect prerenal in the setting of poor oral intake. Baseline creatinine less than 2. Presented with creatinine of 2.85 Continue to monitor. Recent Labs    01/20/22 1532 01/21/22 0343 01/22/22 0934 01/23/22 0357 01/24/22 0318 01/25/22 0325 02/05/22 1037 02/16/22 1008 09/03/22 1747 09/04/22 0129  BUN 65* 66* 61* 62* 62* 65* 45* 47* 54* 60*  CREATININE 1.98* 2.12* 1.66* 2.03* 2.12*  1.89* 1.86* 2.09* 2.85* 2.94*   Chronic diastolic CHF Essential hypertension PTA Toprol, amlodipine, torsemide, losartan Currently all of them are on hold.  Blood pressure trending up.  I will resume Toprol and amlodipine today.  Paroxysmal A-fib  H/o SSS s/p PPM PTA on amiodarone, Eliquis.   Continue midodrine.   Currently on heparin drip Continue to monitor on telemetry.  CAD/HLD Continue Eliquis, statin  Type 2 diabetes with hyperglycemia Diabetic polyneuropathy A1c 6.8 on 09/04/2022 Currently on sliding scale insulin with Accu-Cheks. Blood sugar running elevated.  Add Semglee 10 units daily. Continue gabapentin Recent Labs  Lab 09/04/22 0106 09/04/22 0749 09/04/22 1223  GLUCAP 185* 262* 220*   Anemia of chronic disease Iron deficiency anemia Baseline hemoglobin above 9.  Remains close to baseline.  Continue to monitor. Recent Labs    01/23/22 0357 01/24/22 0318 01/25/22 0325 09/03/22 1747 09/04/22 0129  HGB 9.6* 8.8* 9.3* 9.1* 8.6*  MCV 98.2 99.0 97.8 98.3 96.7   Hypothyroidism Synthroid  Obesity  Body mass index is 36.87 kg/m. Patient has been advised to make an attempt to improve diet and exercise patterns to aid in weight loss.   Mobility: Needs PT eval postprocedure  Goals of care   Code Status: DNR    DVT prophylaxis: Heparin drip currently  Antimicrobials: Broad-spectrum IV antibiotics Fluid: None Consultants: Podiatry, vascular surgery Family Communication: Husband at bedside  Status: Inpatient Level of care:  Telemetry Surgical   Patient from: Home Anticipated d/c to: Pending clinical course Needs to continue in-hospital care:  Pending or today    Diet:  Diet Order  Diet NPO time specified  Diet effective now                   Scheduled Meds:  amiodarone  200 mg Oral Daily   amLODipine  5 mg Oral Daily   atorvastatin  40 mg Oral Daily   Chlorhexidine Gluconate Cloth  6 each Topical Q0600   vitamin D3   1,000 Units Oral Q lunch   cyanocobalamin  1,000 mcg Oral Q lunch   gabapentin  800 mg Oral TID    HYDROmorphone (DILAUDID) injection  0.5 mg Intravenous STAT   insulin aspart  0-5 Units Subcutaneous QHS   insulin aspart  0-9 Units Subcutaneous TID WC   insulin glargine-yfgn  10 Units Subcutaneous Daily   levothyroxine  200 mcg Oral QAC breakfast   metoprolol succinate  100 mg Oral Daily   mupirocin ointment  1 Application Nasal BID    PRN meds: acetaminophen, albuterol, HYDROcodone-acetaminophen, HYDROmorphone (DILAUDID) injection, melatonin, polyethylene glycol, prochlorperazine   Infusions:   cefTRIAXone (ROCEPHIN)  IV 2 g (09/04/22 1036)   And   metronidazole     heparin 1,200 Units/hr (09/04/22 0865)   sodium chloride     [START ON 09/07/2022] vancomycin      Antimicrobials: Anti-infectives (From admission, onward)    Start     Dose/Rate Route Frequency Ordered Stop   09/07/22 1000  vancomycin (VANCOREADY) IVPB 1500 mg/300 mL        1,500 mg 150 mL/hr over 120 Minutes Intravenous every 72 hours 09/04/22 0105     09/04/22 2200  metroNIDAZOLE (FLAGYL) IVPB 500 mg       See Hyperspace for full Linked Orders Report.   500 mg 100 mL/hr over 60 Minutes Intravenous Every 12 hours 09/04/22 1022 09/10/22 2159   09/04/22 1100  cefTRIAXone (ROCEPHIN) 2 g in sodium chloride 0.9 % 100 mL IVPB       See Hyperspace for full Linked Orders Report.   2 g 200 mL/hr over 30 Minutes Intravenous Every 24 hours 09/04/22 1022 09/10/22 1114   09/04/22 0200  vancomycin (VANCOREADY) IVPB 2000 mg/400 mL        2,000 mg 200 mL/hr over 120 Minutes Intravenous  Once 09/04/22 0105 09/04/22 0626   09/03/22 2230  cefTRIAXone (ROCEPHIN) 2 g in sodium chloride 0.9 % 100 mL IVPB  Status:  Discontinued       See Hyperspace for full Linked Orders Report.   2 g 200 mL/hr over 30 Minutes Intravenous Every 24 hours 09/03/22 2218 09/04/22 1022   09/03/22 2230  metroNIDAZOLE (FLAGYL) IVPB 500 mg  Status:   Discontinued       See Hyperspace for full Linked Orders Report.   500 mg 100 mL/hr over 60 Minutes Intravenous Every 12 hours 09/03/22 2218 09/04/22 1022       Nutritional status:  Body mass index is 36.87 kg/m.          Objective: Vitals:   09/04/22 0753 09/04/22 1313  BP: (!) 119/50 (!) 153/48  Pulse: 62 (!) 59  Resp: 18 20  Temp: 98.2 F (36.8 C) 98.8 F (37.1 C)  SpO2: 99% 100%    Intake/Output Summary (Last 24 hours) at 09/04/2022 1343 Last data filed at 09/04/2022 0757 Gross per 24 hour  Intake 697.67 ml  Output --  Net 697.67 ml   Filed Weights   09/04/22 0451  Weight: 100.5 kg   Weight change:  Body mass index is 36.87 kg/m.   Physical  Exam: General exam: Pleasant, elderly Caucasian female. Skin: No rashes, lesions or ulcers. HEENT: Atraumatic, normocephalic, no obvious bleeding Lungs: Clear to auscultation bilaterally CVS: Regular rate and rhythm, no murmur GI/Abd soft, nontender, nondistended, bowel present CNS: Alert, awake, oriented x 3 Psychiatry: Mood appropriate Extremities: No pedal edema, no calf tenderness.  Left great toe wound as described above.  Data Review: I have personally reviewed the laboratory data and studies available.  F/u labs ordered Unresulted Labs (From admission, onward)     Start     Ordered   09/05/22 0500  Heparin level (unfractionated)  Daily,   R      09/04/22 0225   09/05/22 0500  APTT  Daily,   R      09/04/22 0225   09/05/22 0500  CBC  Daily,   R      09/04/22 0225   09/05/22 0500  Basic metabolic panel  Tomorrow morning,   R        09/04/22 1343   09/05/22 0500  CBC with Differential/Platelet  Tomorrow morning,   R        09/04/22 1343   09/03/22 1736  Blood culture (routine x 2)  BLOOD CULTURE X 2,   R (with STAT occurrences)     Question Answer Comment  Patient immune status Normal   Release to patient Immediate      09/03/22 1735            Total time spent in review of labs and imaging,  patient evaluation, formulation of plan, documentation and communication with family: 55 minutes  Signed, Lorin Glass, MD [facility] 09/04/2022

## 2022-09-04 NOTE — Progress Notes (Signed)
Pharmacy Antibiotic Note  Kayla [name] is a 81 y.o. female admitted on 09/03/2022 with L foot wound/osteomyelitis.  Pharmacy has been consulted for Vancomycin  dosing.  Plan: Vancomycin 2000 mg IV now, then Vancomycin 1500 mg IV q72h F/U renal function     Temp (24hrs), Avg:98.8 F (37.1 C), Min:98 F (36.7 C), Max:99.6 F (37.6 C)  Recent Labs  Lab 09/03/22 1747 09/03/22 2245  WBC 10.7*  --   CREATININE 2.85*  --   LATICACIDVEN 1.9 1.6    CrCl cannot be calculated (Unknown ideal weight.).    Allergies  Allergen Reactions   Dulaglutide Nausea Only, Other (See Comments) and Cough    TRULICITY made the patient sick to her stomach (only) several days after using it   Oxycodone Itching and Other (See Comments)    Pt still takes this med even thought it causes itching   Trelegy Ellipta [Fluticasone-Umeclidin-Vilant] Nausea And Vomiting, Other (See Comments) and Cough    Made the patient sick to her stomach (only) several days after using it      Eddie Candle 09/04/2022 12:58 AM

## 2022-09-05 ENCOUNTER — Inpatient Hospital Stay (HOSPITAL_COMMUNITY): Payer: Medicare HMO

## 2022-09-05 DIAGNOSIS — L089 Local infection of the skin and subcutaneous tissue, unspecified: Secondary | ICD-10-CM | POA: Diagnosis not present

## 2022-09-05 DIAGNOSIS — E11628 Type 2 diabetes mellitus with other skin complications: Secondary | ICD-10-CM | POA: Diagnosis not present

## 2022-09-05 DIAGNOSIS — M869 Osteomyelitis, unspecified: Secondary | ICD-10-CM

## 2022-09-05 LAB — BASIC METABOLIC PANEL
Anion gap: 11 (ref 5–15)
BUN: 57 mg/dL — ABNORMAL HIGH (ref 8–23)
CO2: 27 mmol/L (ref 22–32)
Calcium: 8.7 mg/dL — ABNORMAL LOW (ref 8.9–10.3)
Chloride: 99 mmol/L (ref 98–111)
Creatinine, Ser: 3.31 mg/dL — ABNORMAL HIGH (ref 0.44–1.00)
GFR, Estimated: 13 mL/min — ABNORMAL LOW (ref >60)
Glucose, Bld: 156 mg/dL — ABNORMAL HIGH (ref 70–99)
Potassium: 3.9 mmol/L (ref 3.5–5.1)
Sodium: 137 mmol/L (ref 135–145)

## 2022-09-05 LAB — CBC WITH DIFFERENTIAL/PLATELET
Abs Immature Granulocytes: 0.1 10*3/uL — ABNORMAL HIGH (ref 0.00–0.07)
Basophils Absolute: 0.1 10*3/uL (ref 0.0–0.1)
Basophils Relative: 1 %
Eosinophils Absolute: 0.2 10*3/uL (ref 0.0–0.5)
Eosinophils Relative: 3 %
HCT: 24.6 % — ABNORMAL LOW (ref 36.0–46.0)
Hemoglobin: 7.7 g/dL — ABNORMAL LOW (ref 12.0–15.0)
Immature Granulocytes: 1 %
Lymphocytes Relative: 25 %
Lymphs Abs: 2.3 10*3/uL (ref 0.7–4.0)
MCH: 30.6 pg (ref 26.0–34.0)
MCHC: 31.3 g/dL (ref 30.0–36.0)
MCV: 97.6 fL (ref 80.0–100.0)
Monocytes Absolute: 0.9 10*3/uL (ref 0.1–1.0)
Monocytes Relative: 10 %
Neutro Abs: 5.6 10*3/uL (ref 1.7–7.7)
Neutrophils Relative %: 60 %
Platelets: 497 10*3/uL — ABNORMAL HIGH (ref 150–400)
RBC: 2.52 MIL/uL — ABNORMAL LOW (ref 3.87–5.11)
RDW: 14 % (ref 11.5–15.5)
WBC: 9.2 10*3/uL (ref 4.0–10.5)
nRBC: 0 % (ref 0.0–0.2)

## 2022-09-05 LAB — APTT: aPTT: 31 seconds (ref 24–36)

## 2022-09-05 LAB — GLUCOSE, CAPILLARY
Glucose-Capillary: 223 mg/dL — ABNORMAL HIGH (ref 70–99)
Glucose-Capillary: 262 mg/dL — ABNORMAL HIGH (ref 70–99)
Glucose-Capillary: 203 mg/dL — ABNORMAL HIGH (ref 70–99)
Glucose-Capillary: 257 mg/dL — ABNORMAL HIGH (ref 70–99)

## 2022-09-05 LAB — AEROBIC CULTURE W GRAM STAIN (SUPERFICIAL SPECIMEN)

## 2022-09-05 LAB — TYPE AND SCREEN

## 2022-09-05 LAB — HEPARIN LEVEL (UNFRACTIONATED): Heparin Unfractionated: 0.44 IU/mL (ref 0.30–0.70)

## 2022-09-05 LAB — CULTURE, BLOOD (ROUTINE X 2)

## 2022-09-05 LAB — PREPARE RBC (CROSSMATCH)

## 2022-09-05 MED ORDER — MAGNESIUM SULFATE IN D5W 1-5 GM/100ML-% IV SOLN
1.0000 g | Freq: Once | INTRAVENOUS | Status: AC
  Administered 2022-09-05: 1 g via INTRAVENOUS
  Filled 2022-09-05: qty 100

## 2022-09-05 MED ORDER — SODIUM CHLORIDE 0.9% IV SOLUTION
Freq: Once | INTRAVENOUS | Status: AC
  Administered 2022-09-05: 10 mL/h via INTRAVENOUS

## 2022-09-05 MED ORDER — INSULIN GLARGINE-YFGN 100 UNIT/ML ~~LOC~~ SOLN
15.0000 [IU] | Freq: Every day | SUBCUTANEOUS | Status: DC
  Administered 2022-09-06 – 2022-09-07 (×2): 15 [IU] via SUBCUTANEOUS
  Filled 2022-09-05 (×2): qty 0.15

## 2022-09-05 MED ORDER — SODIUM CHLORIDE 0.9 % IV SOLN
INTRAVENOUS | Status: DC
  Administered 2022-09-05: 75 mL/h via INTRAVENOUS

## 2022-09-05 MED ORDER — HEPARIN SODIUM (PORCINE) 5000 UNIT/ML IJ SOLN
5000.0000 [IU] | Freq: Three times a day (TID) | INTRAMUSCULAR | Status: DC
  Administered 2022-09-05 – 2022-09-07 (×6): 5000 [IU] via SUBCUTANEOUS
  Filled 2022-09-05 (×7): qty 1

## 2022-09-05 MED ORDER — APIXABAN 2.5 MG PO TABS: 2.5000 mg | ORAL_TABLET | Freq: Two times a day (BID) | ORAL | Status: DC

## 2022-09-05 MED ORDER — VANCOMYCIN HCL 750 MG/150ML IV SOLN
750.0000 mg | INTRAVENOUS | Status: DC
  Administered 2022-09-06: 750 mg via INTRAVENOUS
  Filled 2022-09-05: qty 150

## 2022-09-05 NOTE — Anesthesia Postprocedure Evaluation (Signed)
Anesthesia Post Note  Patient: Kayla [name]  Procedure(s) Performed: LEFT BIG TOE AMPUTAION (Left: Toe)     Patient location during evaluation: PACU Anesthesia Type: MAC Level of consciousness: awake and alert Pain management: pain level controlled Vital Signs Assessment: post-procedure vital signs reviewed and stable Respiratory status: spontaneous breathing, nonlabored ventilation, respiratory function stable and patient connected to nasal cannula oxygen Cardiovascular status: stable and blood pressure returned to baseline Postop Assessment: no apparent nausea or vomiting Anesthetic complications: no   No notable events documented.               Shelton Silvas

## 2022-09-05 NOTE — Progress Notes (Signed)
   PODIATRY PROGRESS NOTE  NAME Kayla [name] MRN [id number] DOB 22-Jun-1940 DOA 09/03/2022   Reason for consult:  Chief Complaint  Patient presents with   Foot Pain   Dizziness     History of present illness: 82 y.o. female POD # 1 s/p left foot partial 1st ray amputation.  Currently getting a breathing treatment.  She states she has intermittent pain to the foot.  The pain medication does help.  Vitals:   09/05/22 0447 09/05/22 0819  BP: (!) 129/51 (!) 145/46  Pulse: (!) 43 65  Resp: 18 14  Temp: 98.4 F (36.9 C) 98.2 F (36.8 C)  SpO2: 97% 97%       Latest Ref Rng & Units 09/05/2022    1:48 AM 09/04/2022    1:29 AM 09/03/2022    5:47 PM  CBC  WBC 4.0 - 10.5 K/uL 9.2  11.2  10.7   Hemoglobin 12.0 - 15.0 g/dL 7.7  8.6  9.1   Hematocrit 36.0 - 46.0 % 24.6  26.6  29.4   Platelets 150 - 400 K/uL 497  521  542        Latest Ref Rng & Units 09/05/2022    1:48 AM 09/04/2022    1:29 AM 09/03/2022    5:47 PM  BMP  Glucose 70 - 99 mg/dL 409  811  914   BUN 8 - 23 mg/dL 57  60  54   Creatinine 0.44 - 1.00 mg/dL 7.82  9.56  2.13   Sodium 135 - 145 mmol/L 137  140  137   Potassium 3.5 - 5.1 mmol/L 3.9  4.0  4.2   Chloride 98 - 111 mmol/L 99  98  97   CO2 22 - 32 mmol/L 27  29  26    Calcium 8.9 - 10.3 mg/dL 8.7  8.6  8.9       Physical Exam: General: AAOx3, NAD  Dermatology: Incision well coapted with sutures intact.  There is no drainage or pus.  Some macerated tissue noted.  There is no drainage or pus.  Localized edema and erythema.  No fluctuance or crepitation.  No malodor.       Vascular: Foot is warm and well-perfused.  Neurological: Sensation decreased  Musculoskeletal Exam: Mild pain on exam    ASSESSMENT/PLAN OF CARE  Postop day 1 status post left partial first amputation -Dressing changed today.  Betadine applied followed by dry sterile dressing. -Postop x-rays reviewed. -Culture 5/3-NGTD-prelim -Path 5/3- pending -Blood cultures growing  MRSA -Continue current antibiotics.  Recommend infectious disease consultation and cardiology is following as well.  -Weightbearing as tolerated in surgical shoe although limited. -Elevation    Please contact me directly with any questions or concerns.     Ovid Curd, DPM [facility]  Dr. Lesia Sago. Jerman Tinnon, DPM    [address] [place], [place] [address]                Office [phone]  Fax [phone]

## 2022-09-05 NOTE — Consult Note (Addendum)
Electrophysiology consultation   [name] ID: Kayla [name] MRN: [id number]; DOB: 1940-12-29  Admit date: 09/03/2022 Date of Consult: 09/05/2022  PCP:  Kayla Ravel, MD   [facility] Providers Cardiologist:  Kayla Schultz, MD  Electrophysiologist:  Kayla Manges, MD      History of Present Illness:   Kayla [name] is a 82 year old woman who I am seeing today for possible CIED associated bacteremia at Kayla request of Dr. Pola Corn.  Kayla [name] has a history of poorly controlled diabetes, diabetic polyneuropathy, sick sinus syndrome post permanent pacemaker implant, hyperlipidemia, obesity, chronic venous insufficiency, COPD, gout, iron deficiency anemia, hypertension, paroxysmal atrial fibrillation on Eliquis.  She was admitted with left great toe diabetic foot infection and has recently undergone amputation of her great toe.  During Kayla hospitalization blood cultures have returned positive (2 of 2) for MRSA.   Past medical, surgical, family and social histories reviewed.         ROS:  Please see Kayla history of present illness.  All other ROS reviewed and negative.     Physical Exam/Data:   Vitals:   09/04/22 1910 09/05/22 0059 09/05/22 0447 09/05/22 0819  BP: 123/64 (!) 135/54 (!) 129/51 (!) 145/46  Pulse: 60 (!) 59 (!) 43 65  Resp: 20 16 18 14   Temp: 98.8 F (37.1 C) 97.7 F (36.5 C) 98.4 F (36.9 C) 98.2 F (36.8 C)  TempSrc: Oral Oral Oral Oral  SpO2: 99% 98% 97% 97%  Weight:        Intake/Output Summary (Last 24 hours) at 09/05/2022 1203 Last data filed at 09/05/2022 1000 Gross per 24 hour  Intake 100 ml  Output 875 ml  Net -775 ml      09/04/2022    4:51 AM 07/23/2022    8:56 AM 07/03/2022    8:40 AM  Last 3 Weights  Weight (lbs) 221 lb 9 oz 237 lb 234 lb  Weight (kg) 100.5 kg 107.502 kg 106.142 kg     Body mass index is 36.87 kg/m.   General:  Well nourished, obese, elderly Cardiac:  normal S1, S2; warm extremities.  Left prepectoral pocket  well-healed.  No signs of pocket infection. Lungs:  clear to auscultation bilaterally, no wheezing, rhonchi or rales  Psych:  Normal affect   EKG:  Kayla EKG was personally reviewed and demonstrates: Atrial paced Telemetry:  Telemetry was personally reviewed and demonstrates: A pacing, V sensing   October 2023 echo shows EF 60 to 65%, normal RV function, trivial MR  Chest x-ray personally reviewed shows dual-chamber permanent pacemaker system   Device was implanted November 08, 2015 and includes a 3830 in Kayla RV and a 5076 in Kayla RA.   Assessment and Plan:    #MRSA bacteremia #CIED associated infection Kayla [name] has 2 of 2 blood cultures positive for bacteremia.  Microbiology shows MRSA infection.  Given speciation of cultures, device is involved.  She needs a transesophageal echo to assess for valvular vegetations and lead related vegetations.  Recommend infectious disease consult.    All of Kayla above will need to be reviewed prior to finalizing her candidacy for pacemaker system extraction.  Stop Eliquis and transition to IV heparin until decision regarding treatment plan for pacemaker associated infection is finalized.  #Anemia [name] is receiving a unit of PRBCs this morning per internal medicine  #Acute on chronic kidney disease Medicine following.    Case was discussed at Kayla bedside with Kayla hospitalist.  Extensive conversation with Kayla  [name] and her husband at Kayla bedside.   EP will follow along.   For questions or updates, please contact [facility] Please consult [email or web address] for contact info under    Signed, Kayla Prude, MD  09/05/2022 12:03 PM

## 2022-09-05 NOTE — Progress Notes (Signed)
Pharmacy Antibiotic Note  Kayla [name] is a 82 y.o. female admitted on 09/03/2022 with left foot wound/ concern for osteomyelitis. Pharmacy has been consulted for Vancomycin dosing.  S/p left partial first ray amputation on 5/3 pm.   BCID showing MRSA.  EP consulted for pacer extraction.  ID consult pending.  Vancomycin 2gm IV loading dose given on 5/3 am. AKI on CKD4, creatinine trended up today.  Plan: Vancomycin 750 mg IV q48hrs to begin 5/5 Goal AUC 400-550. Expected AUC: 520 SCr used: 3.31 and Vd 0.5L/kg (BMI 36.8) Also on Ceftriaxone 2gm IV q24h  And Metronidazole 500 mg IV q12h. Follow renal function, culture data, clinical progress and antibiotic plans.   Height: 5\' 5"  (165.1 cm) Weight: 100.5 kg (221 lb 9 oz) IBW/kg (Calculated) : 57  Temp (24hrs), Avg:98.5 F (36.9 C), Min:97.7 F (36.5 C), Max:98.9 F (37.2 C)  Recent Labs  Lab 09/03/22 1747 09/03/22 2245 09/04/22 0129 09/05/22 0148  WBC 10.7*  --  11.2* 9.2  CREATININE 2.85*  --  2.94* 3.31*  LATICACIDVEN 1.9 1.6  --   --     Estimated Creatinine Clearance: 15.7 mL/min (A) (by C-G formula based on SCr of 3.31 mg/dL (H)).    Allergies  Allergen Reactions   Dulaglutide Nausea Only, Other (See Comments) and Cough    TRULICITY made the patient sick to her stomach (only) several days after using it   Oxycodone Itching and Other (See Comments)    Pt still takes this med even thought it causes itching   Trelegy Ellipta [Fluticasone-Umeclidin-Vilant] Nausea And Vomiting, Other (See Comments) and Cough    Made the patient sick to her stomach (only) several days after using it     Antimicrobials this admission: Vancomycin 5/3 >> Ceftriaxone 5/3 >> Metronidazole 5/3 >> Bactroban/CHG 5/3 >>(5/7)  Dose adjustments this admission: 5/4: maintenance vanc dose adjusted with SCr trend up  Microbiology results: 5/2 blood: GPC in clusters > Staph aureus, sens pending 5/3 BCID: Staph species, resistance  detected 5/3 L foot wound: no organisms on gram stain, pending 5/3 surgical PCR: positive  Thank you for allowing pharmacy to be a part of this patient's care.  Dennie Fetters, [place] 09/05/2022 4:26 PM

## 2022-09-05 NOTE — Progress Notes (Signed)
PROGRESS NOTE  Kayla [name]  DOB: 14-Feb-1941  PCP: Ailene Ravel, MD OZH:[id number]  DOA: 09/03/2022  LOS: 2 days  Hospital Day: 3  Brief narrative: Kayla [name] is a 82 y.o. female with PMH significant for obesity, OSA, DM2, HTN, HLD, CAD, CHF, paroxysmal A-fib on Eliquis, SSS s/p PPM, carotid artery disease, CKD, chronic anemia, COPD, anxiety/depression, GERD, hypothyroidism, diabetic polyneuropathy, chronic venous insufficiency, h/o left toe amputation 5/2, patient was sent to the ED by her podiatrist Dr. Annamary Rummage with concern of left great toe diabetic foot infection possible osteomyelitis seen on x-ray.  In the ED, patient is afebrile, blood pressure 121/48, breathing on 3 L oxygen by nasal cannula Initial labs with WC count 10.7, hemoglobin 9.1, lactic acid 1.1, creatinine 2.85, glucose 250 MRI left foot confirmed osteomyelitis. Started on broad-spectrum IV antibiotics Admitted to [facility]. 5/3, s/p left partial first ray amputation.  Subjective: Patient was seen and examined this morning.  Seen together with cardiologist Dr. Lalla Brothers Lying on bed.  Does not feel good.  Husband at bedside.  Dr. Lalla Brothers and I discussed with her about having MRSA bacteremia and the need to extract the pacemaker leads. Patient felt overwhelmed but understands the need to proceed.  Labs from this morning with hemoglobin down to 7.7, creatinine up to 3.21  Assessment and plan: Left diabetic foot infection  Left great toe wound infection with concern for osteomyelitis, POA S/p left partial first ray amputation -5/3 Dr. Ardelle Anton MRSA bacteremia Seen the vascular surgery but revascularization was not needed. Procedure as above. Blood culture drawn on admission is growing MRSA in both sets. Currently on IV antibiotic coverage with IV Rocephin, IV Flagyl, IV vancomycin ID consulted. Continue pain meds  Acute on chronic anemia Iron deficiency anemia Baseline hemoglobin above 9.   Hemoglobin  level dropped down to 7.7 today.  Probably because of surgical blood loss.  Because of her cardiac risk factors, will keep hemoglobin above 8.  Discussed with cardiology.  1 unit of PRBC transfusion planned for today. Recent Labs    01/24/22 0318 01/25/22 0325 09/03/22 1747 09/04/22 0129 09/05/22 0148  HGB 8.8* 9.3* 9.1* 8.6* 7.7*  MCV 99.0 97.8 98.3 96.7 97.6   AKI on CKD 4 suspect prerenal in the setting of poor oral intake. Baseline creatinine less than 2. Presented with creatinine of 2.85.  Worsened to 3.31 today.  Start on normal saline at 75 mill per hour. Continue to monitor. Recent Labs    01/21/22 0343 01/22/22 0934 01/23/22 0357 01/24/22 0318 01/25/22 0325 02/05/22 1037 02/16/22 1008 09/03/22 1747 09/04/22 0129 09/05/22 0148  BUN 66* 61* 62* 62* 65* 45* 47* 54* 60* 57*  CREATININE 2.12* 1.66* 2.03* 2.12* 1.89* 1.86* 2.09* 2.85* 2.94* 3.31*   Chronic diastolic CHF Essential hypertension PTA Toprol, amlodipine, torsemide, losartan Currently on Toprol.  Others on hold.    Paroxysmal A-fib  H/o SSS s/p PPM PTA on Toprol, amiodarone, Eliquis.   Continue Toprol and amiodarone. Eliquis was switched to heparin drip for surgery. Based on my discussion with cardiologist Dr. Lalla Brothers this morning, we will stop anticoagulation at this time because of anemia and potential need of lead extraction in few days.  CAD/HLD Continue statin.  Anticoagulation plan as above.  Type 2 diabetes with hyperglycemia Diabetic polyneuropathy A1c 6.8 on 09/04/2022 Currently on Semglee 10 units daily and sliding scale insulin with Accu-Cheks. Blood sugar level is still elevated. Increase Semglee to 15 units daily. Continue gabapentin Recent Labs  Lab 09/04/22 1619  09/04/22 1820 09/04/22 2222 09/05/22 0804 09/05/22 1120  GLUCAP 147* 124* 207* 223* 262*   Hypothyroidism Synthroid  Obesity  Body mass index is 36.87 kg/m. Patient has been advised to make an attempt to improve diet  and exercise patterns to aid in weight loss.   Mobility: Needs PT eval postprocedure  Goals of care   Code Status: DNR    DVT prophylaxis: Heparin drip currently heparin injection 5,000 Units Start: 09/05/22 1400  Antimicrobials: Broad-spectrum IV antibiotics Fluid: None Consultants: Podiatry, vascular surgery Family Communication: Husband at bedside  Status: Inpatient Level of care:  Telemetry Surgical   Patient from: Home Anticipated d/c to: Pending clinical course Needs to continue in-hospital care:  MRSA bacteremia.  Needs further workup and treatment inpatient   Diet:  Diet Order             Diet heart healthy/carb modified Room service appropriate? Yes; Fluid consistency: Thin  Diet effective now                   Scheduled Meds:  sodium chloride   Intravenous Once   amiodarone  200 mg Oral Daily   atorvastatin  40 mg Oral Daily   Chlorhexidine Gluconate Cloth  6 each Topical Q0600   vitamin D3  1,000 Units Oral Q lunch   cyanocobalamin  1,000 mcg Oral Q lunch   gabapentin  200 mg Oral TID   heparin injection (subcutaneous)  5,000 Units Subcutaneous Q8H   insulin aspart  0-5 Units Subcutaneous QHS   insulin aspart  0-9 Units Subcutaneous TID WC   [START ON 09/06/2022] insulin glargine-yfgn  15 Units Subcutaneous Daily   levothyroxine  200 mcg Oral QAC breakfast   metoprolol succinate  100 mg Oral Daily   mupirocin ointment  1 Application Nasal BID    PRN meds: acetaminophen, albuterol, HYDROcodone-acetaminophen, HYDROmorphone (DILAUDID) injection, melatonin, polyethylene glycol, prochlorperazine   Infusions:   sodium chloride 75 mL/hr (09/05/22 1130)   cefTRIAXone (ROCEPHIN)  IV 2 g (09/05/22 1243)   And   metronidazole 500 mg (09/05/22 1131)   magnesium sulfate bolus IVPB 1 g (09/05/22 1335)   sodium chloride     [START ON 09/06/2022] vancomycin      Antimicrobials: Anti-infectives (From admission, onward)    Start     Dose/Rate Route  Frequency Ordered Stop   09/07/22 1000  vancomycin (VANCOREADY) IVPB 1500 mg/300 mL  Status:  Discontinued        1,500 mg 150 mL/hr over 120 Minutes Intravenous every 72 hours 09/04/22 0105 09/05/22 1019   09/06/22 1200  vancomycin (VANCOREADY) IVPB 750 mg/150 mL        750 mg 150 mL/hr over 60 Minutes Intravenous Every 48 hours 09/05/22 1019     09/04/22 2200  metroNIDAZOLE (FLAGYL) IVPB 500 mg       See Hyperspace for full Linked Orders Report.   500 mg 100 mL/hr over 60 Minutes Intravenous Every 12 hours 09/04/22 1022 09/10/22 2159   09/04/22 1100  cefTRIAXone (ROCEPHIN) 2 g in sodium chloride 0.9 % 100 mL IVPB       See Hyperspace for full Linked Orders Report.   2 g 200 mL/hr over 30 Minutes Intravenous Every 24 hours 09/04/22 1022 09/10/22 1059   09/04/22 0200  vancomycin (VANCOREADY) IVPB 2000 mg/400 mL        2,000 mg 200 mL/hr over 120 Minutes Intravenous  Once 09/04/22 0105 09/04/22 0626   09/03/22 2230  cefTRIAXone (ROCEPHIN) 2  g in sodium chloride 0.9 % 100 mL IVPB  Status:  Discontinued       See Hyperspace for full Linked Orders Report.   2 g 200 mL/hr over 30 Minutes Intravenous Every 24 hours 09/03/22 2218 09/04/22 1022   09/03/22 2230  metroNIDAZOLE (FLAGYL) IVPB 500 mg  Status:  Discontinued       See Hyperspace for full Linked Orders Report.   500 mg 100 mL/hr over 60 Minutes Intravenous Every 12 hours 09/03/22 2218 09/04/22 1022       Nutritional status:  Body mass index is 36.87 kg/m.          Objective: Vitals:   09/05/22 0447 09/05/22 0819  BP: (!) 129/51 (!) 145/46  Pulse: (!) 43 65  Resp: 18 14  Temp: 98.4 F (36.9 C) 98.2 F (36.8 C)  SpO2: 97% 97%    Intake/Output Summary (Last 24 hours) at 09/05/2022 1404 Last data filed at 09/05/2022 1338 Gross per 24 hour  Intake 340 ml  Output 875 ml  Net -535 ml   Filed Weights   09/04/22 0451  Weight: 100.5 kg   Weight change:  Body mass index is 36.87 kg/m.   Physical Exam: General  exam: Pleasant, elderly Caucasian female. Skin: No rashes, lesions or ulcers. HEENT: Atraumatic, normocephalic, no obvious bleeding Lungs: Clear to auscultation bilaterally CVS: Regular rate and rhythm, no murmur GI/Abd soft, nontender, nondistended, bowel present CNS: Alert, awake, oriented x 3 Psychiatry: Overwhelmed with new findings  Extremities: No pedal edema, no calf tenderness.  Status post left great toe amputation over  Data Review: I have personally reviewed the laboratory data and studies available.  F/u labs ordered Unresulted Labs (From admission, onward)     Start     Ordered   09/06/22 0500  Basic metabolic panel  Daily,   R      09/05/22 0759   09/06/22 0500  CBC  Tomorrow morning,   R        09/05/22 1037   09/06/22 0500  Magnesium  Tomorrow morning,   R        09/05/22 1154   09/05/22 1357  Prepare RBC (crossmatch)  (Blood Administration Adult)  Once,   R       Question Answer Comment  # of Units 1 unit   Transfusion Indications Hemoglobin 8 gm/dL or less and orthopedic or cardiac surgery or pre-existing cardiac condition   Number of Units to Keep Ahead NO units ahead   If emergent release call blood bank Not emergent release      09/05/22 1356   09/05/22 1356  Type and screen [facility]  (Blood Administration Adult)  Once,   R       Comments: [facility]    09/05/22 1356            Total time spent in review of labs and imaging, patient evaluation, formulation of plan, documentation and communication with family: 45 minutes  Signed, Lorin Glass, MD [facility] 09/05/2022

## 2022-09-06 ENCOUNTER — Inpatient Hospital Stay (HOSPITAL_COMMUNITY): Payer: Medicare HMO

## 2022-09-06 ENCOUNTER — Encounter (HOSPITAL_COMMUNITY): Payer: Self-pay | Admitting: Podiatry

## 2022-09-06 DIAGNOSIS — I38 Endocarditis, valve unspecified: Secondary | ICD-10-CM

## 2022-09-06 DIAGNOSIS — S92414A Nondisplaced fracture of proximal phalanx of right great toe, initial encounter for closed fracture: Secondary | ICD-10-CM | POA: Diagnosis not present

## 2022-09-06 DIAGNOSIS — L089 Local infection of the skin and subcutaneous tissue, unspecified: Secondary | ICD-10-CM | POA: Diagnosis not present

## 2022-09-06 DIAGNOSIS — Z794 Long term (current) use of insulin: Secondary | ICD-10-CM

## 2022-09-06 DIAGNOSIS — B9562 Methicillin resistant Staphylococcus aureus infection as the cause of diseases classified elsewhere: Secondary | ICD-10-CM

## 2022-09-06 DIAGNOSIS — E11628 Type 2 diabetes mellitus with other skin complications: Secondary | ICD-10-CM | POA: Diagnosis not present

## 2022-09-06 LAB — BASIC METABOLIC PANEL
Anion gap: 7 (ref 5–15)
BUN: 53 mg/dL — ABNORMAL HIGH (ref 8–23)
CO2: 26 mmol/L (ref 22–32)
Calcium: 8.5 mg/dL — ABNORMAL LOW (ref 8.9–10.3)
Chloride: 102 mmol/L (ref 98–111)
Creatinine, Ser: 2.69 mg/dL — ABNORMAL HIGH (ref 0.44–1.00)
GFR, Estimated: 17 mL/min — ABNORMAL LOW (ref >60)
Glucose, Bld: 244 mg/dL — ABNORMAL HIGH (ref 70–99)
Potassium: 3.9 mmol/L (ref 3.5–5.1)
Sodium: 135 mmol/L (ref 135–145)

## 2022-09-06 LAB — CBC
HCT: 26.6 % — ABNORMAL LOW (ref 36.0–46.0)
Hemoglobin: 8.2 g/dL — ABNORMAL LOW (ref 12.0–15.0)
MCH: 29.8 pg (ref 26.0–34.0)
MCHC: 30.8 g/dL (ref 30.0–36.0)
MCV: 96.7 fL (ref 80.0–100.0)
Platelets: 457 10*3/uL — ABNORMAL HIGH (ref 150–400)
RBC: 2.75 MIL/uL — ABNORMAL LOW (ref 3.87–5.11)
RDW: 15.2 % (ref 11.5–15.5)
WBC: 9.5 10*3/uL (ref 4.0–10.5)
nRBC: 0 % (ref 0.0–0.2)

## 2022-09-06 LAB — ECHOCARDIOGRAM COMPLETE
AR max vel: 2.02 cm2
AV Area VTI: 2.43 cm2
AV Area mean vel: 2.18 cm2
AV Mean grad: 13.5 mmHg
AV Peak grad: 24.1 mmHg
Ao pk vel: 2.46 m/s
Area-P 1/2: 2.99 cm2
S' Lateral: 2.9 cm

## 2022-09-06 LAB — CULTURE, BLOOD (ROUTINE X 2)

## 2022-09-06 LAB — TYPE AND SCREEN
Antibody Screen: NEGATIVE
Unit division: 0

## 2022-09-06 LAB — GLUCOSE, CAPILLARY
Glucose-Capillary: 181 mg/dL — ABNORMAL HIGH (ref 70–99)
Glucose-Capillary: 256 mg/dL — ABNORMAL HIGH (ref 70–99)
Glucose-Capillary: 226 mg/dL — ABNORMAL HIGH (ref 70–99)
Glucose-Capillary: 241 mg/dL — ABNORMAL HIGH (ref 70–99)

## 2022-09-06 LAB — MAGNESIUM: Magnesium: 2 mg/dL (ref 1.7–2.4)

## 2022-09-06 NOTE — Consult Note (Signed)
[facility]    Date of Admission:  09/03/2022   Total days of inpatient antibiotics 2        Reason for Consult: MRSA bacteremia    Principal Problem:   Diabetic foot infection (HCC) Active Problems:   Acute hematogenous osteomyelitis of left foot (HCC)   Pyogenic inflammation of bone [facility])   Assessment: 82 year old female previous history of recurrent septic prepatellar bursitis status post washout in March 2023 or cultures grew MRSA treated with daptomycin x 6 weeks admitted with left great toe foot infection:  #MRSA bacteremia 2/2 #3 #CIED  - Blood cultures on 5/2 grew 2/2 MRSA  - PPM site is tender on palpation  #DFU is post first ray amputation on 5/3 noted that was no purulence or proximal tracking. -Sent o [facility] ED at request of podiatry to to c/f possible osteo on xrya.  - MRI left foot without contrast on 5/3 showed tissue loss around great toe with sinus tract stenting to proximal phalanx with 1.3 X1.3X 1.2 cm soft tissue abscess.  Concerning for superimposed osteomyelitis.  Metallic  body body and fifth and fourth metatarsal head. -Taken to the OR for left partial first ray amputation, podiatry Dr. Ardelle Anton.Pt states Kayla [name] was on doxy x10 days prior to hospitalization(seen by Dr. Allena Katz -Podiatry on 4/18) outpatient.  Recommendations: - Continue vancomycin - D/C ceftriaxone and metronidazole - Follow-up repeat blood cultures to ensure clearance - Follow-up or cultures and path from 5/3 - TTE, will need TEE(cardiology following) plan on TEE.  Given MRSA bacteremia will need device removal - dapto sens ordered  #Diabetes mellitus - A1c 6.8 on 09/04/2022  #PAF on Eliquis - Management per primary  #CKD stage IV - Vancomycin is being dosed with pharmacy  I have personally spent 85 minutes involved in face-to-face and non-face-to-face activities for this patient on the day of the visit. Professional time spent includes the following  activities: Preparing to see the patient (review of tests), Obtaining and/or reviewing separately obtained history (admission/discharge record), Performing a medically appropriate examination and/or evaluation , Ordering medications/tests/procedures, referring and communicating with other health care professionals, Documenting clinical information in the EMR, Independently interpreting results (not separately reported), Communicating results to the patient/family/caregiver, Counseling and educating the patient/family/caregiver and Care coordination (not separately reported).   Microbiology:   Antibiotics: Vancomycin, ceftriaxone, metronidazole  Cultures: Blood 5/22/2 MRSA Urine  Other   HPI: Kayla [name] is a 82 y.o. female CHF, CKD stage IV, COPD, chronic pain, CAD, diabetes mellitus on insulin, hypertension, PAF on Eliquis, recurrent septic prepatellar bursitis status post washout on 07/22/2021 with or cultures growing MRSA treated with daptomycin x 6 weeks through 09/04/2022 sent per podiatry request for concern of left great toe diabetic foot infection, possible osteo on x-ray.  On arrival vital stable WBC 10.7 K.  Started on Vanco, Rocephin, Flagyl blood cultures grew MRSA.  Infectious disease engaged.   Review of Systems: Review of Systems  All other systems reviewed and are negative.   Past Medical History:  Diagnosis Date   Allergy    Anxiety    Arthritis    CAD (coronary artery disease)    Mild per remote cath in 2006, /    Nuclear, January, 2013, no significant abnormality,   Carotid artery disease (HCC)    Doppler, January, 2013, 0 39% RIC A., 40-59% LICA, stable   Cataract    CHF (congestive heart failure) (HCC)  Chronic kidney disease    Complication of anesthesia    wakes up "mean"   COPD 02/16/2007   Intolerant of Spiriva, tudorza Intolerant of Trelegy    COPD (chronic obstructive pulmonary disease) (HCC)    Coronary atherosclerosis 11/24/2008    Catheterization 2006, nonobstructive coronary disease   //   nuclear 2013, low risk     Diabetes mellitus, type 2 (HCC)    Diverticula of colon    DIVERTICULOSIS OF COLON 03/23/2007   Qualifier: Diagnosis of  By: Craige Cotta MD, Vineet     Dizziness    occasional   Dizzy spells 05/04/2011   DM (diabetes mellitus) (HCC) 03/23/2007   Qualifier: Diagnosis of  By: Craige Cotta MD, Vineet     Dyspnea    On exertion   Ejection fraction    Essential hypertension 02/16/2007   Qualifier: Diagnosis of  By: Earlene Plater CMA, Tammy     G E R D 03/23/2007   Qualifier: Diagnosis of  By: Craige Cotta MD, Vineet     Gall bladder disease 04/28/2016   Gastritis and gastroduodenitis    GERD (gastroesophageal reflux disease)    Goiter    hx of   Headache(784.0)    migraine   Hyperlipidemia    Hypertension    Hypothyroidism    HYPOTHYROIDISM, POSTSURGICAL 06/04/2008   Qualifier: Diagnosis of  By: Everardo All MD, Sean A    Left ventricular hypertrophy    Leg edema    occasional swelling   Leg swelling 12/09/2018   Myofascial pain syndrome, cervical 01/06/2018   Obesity    OBESITY 11/24/2008   Qualifier: Diagnosis of  By: Vikki Ports     OBSTRUCTIVE SLEEP APNEA 02/16/2007   Auto CPAP 09/03/13 to 10/02/13 >> used on 30 of 30 nights with average 6 hrs and 3 min.  Average AHI is 3.1 with median CPAP 5 cm H2O and 95 th percentile CPAP 6 cm H2O.     Occipital neuralgia of right side 01/06/2018   OSA (obstructive sleep apnea)    cpap setting of 12   Pain in joint of right hip 01/21/2018   Palpitations 01/21/2011   48 hour Holter, 2015, scattered PACs and PVCs.    Paroxysmal atrial fibrillation (HCC)    Pneumonia    PONV (postoperative nausea and vomiting)    severe after every surgery   Pulmonary hypertension, secondary    RHINITIS 07/21/2010        RUQ abdominal pain    S/P left TKA 07/27/2011   Sinus node dysfunction (HCC) 11/08/2015   Sleep apnea    Spinal stenosis of cervical region 11/07/2014   Urinary frequency  12/09/2018   UTI (urinary tract infection) 08/15/2019   per pt report   VENTRICULAR HYPERTROPHY, LEFT 11/24/2008   Qualifier: Diagnosis of  By: Vikki Ports      Social History   Tobacco Use   Smoking status: Former    Packs/day: 2.00    Years: 33.00    Additional pack years: 0.00    Total pack years: 66.00    Types: Cigarettes    Start date: 1966    Quit date: 05/04/1997    Years since quitting: 25.3   Smokeless tobacco: Never  Vaping Use   Vaping Use: Never used  Substance Use Topics   Alcohol use: No   Drug use: No    Family History  Problem Relation Age of Onset   Heart Problems Mother    Heart defect Mother  valve problems   Heart Problems Father    Heart disease Brother    Lung cancer Daughter        non small cell    Diabetes Maternal Aunt    Diabetes Maternal Uncle    Throat cancer Brother    Colon cancer Neg Hx    Colon polyps Neg Hx    Esophageal cancer Neg Hx    Rectal cancer Neg Hx    Stomach cancer Neg Hx    Scheduled Meds:  amiodarone  200 mg Oral Daily   atorvastatin  40 mg Oral Daily   Chlorhexidine Gluconate Cloth  6 each Topical Q0600   vitamin D3  1,000 Units Oral Q lunch   cyanocobalamin  1,000 mcg Oral Q lunch   gabapentin  200 mg Oral TID   heparin injection (subcutaneous)  5,000 Units Subcutaneous Q8H   insulin aspart  0-5 Units Subcutaneous QHS   insulin aspart  0-9 Units Subcutaneous TID WC   insulin glargine-yfgn  15 Units Subcutaneous Daily   levothyroxine  200 mcg Oral QAC breakfast   metoprolol succinate  100 mg Oral Daily   mupirocin ointment  1 Application Nasal BID   Continuous Infusions:  sodium chloride 75 mL/hr at 09/06/22 0600   cefTRIAXone (ROCEPHIN)  IV Stopped (09/05/22 1313)   And   metronidazole 500 mg (09/06/22 0806)   sodium chloride     vancomycin     PRN Meds:.acetaminophen, albuterol, HYDROcodone-acetaminophen, HYDROmorphone (DILAUDID) injection, melatonin, polyethylene glycol,  prochlorperazine Allergies  Allergen Reactions   Dulaglutide Nausea Only, Other (See Comments) and Cough    TRULICITY made the patient sick to her stomach (only) several days after using it   Oxycodone Itching and Other (See Comments)    Pt still takes this med even thought it causes itching   Trelegy Ellipta [Fluticasone-Umeclidin-Vilant] Nausea And Vomiting, Other (See Comments) and Cough    Made the patient sick to her stomach (only) several days after using it     OBJECTIVE: Blood pressure (!) 140/50, pulse 64, temperature 97.7 F (36.5 C), temperature source Oral, resp. rate 17, height 5\' 5"  (1.651 m), weight 100.5 kg, SpO2 96 %.  Physical Exam Constitutional:      Appearance: Normal appearance.  HENT:     Head: Normocephalic and atraumatic.     Right Ear: Tympanic membrane normal.     Left Ear: Tympanic membrane normal.     Nose: Nose normal.     Mouth/Throat:     Mouth: Mucous membranes are moist.  Eyes:     Extraocular Movements: Extraocular movements intact.     Conjunctiva/sclera: Conjunctivae normal.     Pupils: Pupils are equal, round, and reactive to light.  Cardiovascular:     Rate and Rhythm: Normal rate and regular rhythm.     Heart sounds: No murmur heard.    No friction rub. No gallop.  Pulmonary:     Effort: Pulmonary effort is normal.     Breath sounds: Normal breath sounds.  Abdominal:     General: Abdomen is flat.     Palpations: Abdomen is soft.  Musculoskeletal:        General: Normal range of motion.     Comments: Left foot banadage, right toe erythematous  Skin:    General: Skin is warm and dry.  Neurological:     General: No focal deficit present.     Mental Status: Kayla [name] is alert and oriented to person, place, and time.  Psychiatric:  Mood and Affect: Mood normal.     Lab Results Lab Results  Component Value Date   WBC 9.5 09/06/2022   HGB 8.2 (L) 09/06/2022   HCT 26.6 (L) 09/06/2022   MCV 96.7 09/06/2022   PLT 457 (H)  09/06/2022    Lab Results  Component Value Date   CREATININE 2.69 (H) 09/06/2022   BUN 53 (H) 09/06/2022   NA 135 09/06/2022   K 3.9 09/06/2022   CL 102 09/06/2022   CO2 26 09/06/2022    Lab Results  Component Value Date   ALT 14 09/03/2022   AST 16 09/03/2022   ALKPHOS 64 09/03/2022   BILITOT 0.3 09/03/2022       Danelle Earthly, MD [facility] 09/06/2022, 9:15 AM

## 2022-09-06 NOTE — Progress Notes (Signed)
   Rounding Note    Patient Name: Kayla [name] Date of Encounter: 09/06/2022  [facility] Cardiologist: Donato Schultz, MD   Subjective   NAEO. Husband at bedside.  Vital Signs    Vitals:   09/05/22 1930 09/05/22 2359 09/06/22 0400 09/06/22 0743  BP: (!) 148/57 137/64 (!) 140/55 (!) 140/50  Pulse: 60 63 (!) 59 64  Resp: (!) 25 16 15 17   Temp:  98.7 F (37.1 C)  97.7 F (36.5 C)  TempSrc:  Oral  Oral  SpO2: 98% 98% 98% 96%  Weight:      Height:        Intake/Output Summary (Last 24 hours) at 09/06/2022 0943 Last data filed at 09/06/2022 0600 Gross per 24 hour  Intake 2151.46 ml  Output 1250 ml  Net 901.46 ml      09/04/2022    4:51 AM 07/23/2022    8:56 AM 07/03/2022    8:40 AM  Last 3 Weights  Weight (lbs) 221 lb 9 oz 237 lb 234 lb  Weight (kg) 100.5 kg 107.502 kg 106.142 kg      Telemetry    Personally Reviewed  ECG    Personally Reviewed  Physical Exam    GEN: No acute distress.  Elderly. Frail. Cardiac: RRR, no murmurs, rubs, or gallops.  Respiratory: Clear to auscultation bilaterally. Psych: Normal affect   Assessment & Plan     #MRSA bacteremia #CIED associated infection The patient has 2 of 2 blood cultures positive for bacteremia.  Microbiology shows MRSA infection.  Given speciation of cultures, device is involved.  She needs a transesophageal echo to assess for valvular vegetations and lead related vegetations.    Infectious disease consult pending.   All of the above will need to be reviewed prior to finalizing her candidacy for pacemaker system extraction.   Stop Eliquis until decision regarding treatment plan for pacemaker associated infection is finalized.  Keep NPO after MN in anticipation of TEE.   Will need to reprogram her device Monday to lower pacing rate to see if reimplant would be necessary. This will also help inform our conversations ith family.  #Anemia Improved.   #Acute on chronic kidney disease Medicine  following.        Sheria Lang T. Lalla Brothers, MD, [facility], [facility] Cardiac Electrophysiology

## 2022-09-06 NOTE — Progress Notes (Signed)
PODIATRY PROGRESS NOTE  NAME Kayla [name] MRN [id number] DOB [birth year] DOA 09/03/2022   Reason for consult:  Chief Complaint  Patient presents with   Foot Pain   Dizziness     History of present illness: 82 y.o. female POD # 2 s/p left foot partial 1st ray amputation.  Overall feeling better today.  No fevers or chills.  Her husband is at bedside.  Vitals:   09/06/22 0400 09/06/22 0743  BP: (!) 140/55 (!) 140/50  Pulse: (!) 59 64  Resp: 15 17  Temp:  97.7 F (36.5 C)  SpO2: 98% 96%    CBC    Component Value Date/Time   WBC 9.5 09/06/2022 0137   RBC 2.75 (L) 09/06/2022 0137   HGB 8.2 (L) 09/06/2022 0137   HGB 11.3 01/30/2021 1154   HCT 26.6 (L) 09/06/2022 0137   HCT 35.9 01/30/2021 1154   PLT 457 (H) 09/06/2022 0137   PLT 389 01/30/2021 1154   MCV 96.7 09/06/2022 0137   MCV 91 01/30/2021 1154   MCH 29.8 09/06/2022 0137   MCHC 30.8 09/06/2022 0137   RDW 15.2 09/06/2022 0137   RDW 13.4 01/30/2021 1154   LYMPHSABS 2.3 09/05/2022 0148   MONOABS 0.9 09/05/2022 0148   EOSABS 0.2 09/05/2022 0148   BASOSABS 0.1 09/05/2022 0148       Latest Ref Rng & Units 09/06/2022    1:37 AM 09/05/2022    1:48 AM 09/04/2022    1:29 AM  BMP  Glucose 70 - 99 mg/dL 119  147  829   BUN 8 - 23 mg/dL 53  57  60   Creatinine 0.44 - 1.00 mg/dL 5.62  1.30  8.65   Sodium 135 - 145 mmol/L 135  137  140   Potassium 3.5 - 5.1 mmol/L 3.9  3.9  4.0   Chloride 98 - 111 mmol/L 102  99  98   CO2 22 - 32 mmol/L 26  27  29    Calcium 8.9 - 10.3 mg/dL 8.5  8.7  8.6        Physical Exam: General: AAOx3, NAD  Dermatology: Incision well coapted with sutures intact.  There is no drainage or pus.  Dry tissue is present yesterday has improved.  There is no drainage or pus.  Localized edema and erythema.  No fluctuance or crepitation.  No malodor.  On the right foot there is ecchymosis present on the first interspace and localized edema to the hallux.  No lesions.          Vascular: Foot  is warm and well-perfused.  Neurological: Sensation decreased  Musculoskeletal Exam: No significant pain on exam bilaterally.    ASSESSMENT/PLAN OF CARE  Postop day 2 status post left partial first amputation -Dressing changed today.  Betadine applied followed by dry sterile dressing.  Patient appears to be healing well at this time postoperatively. -Culture 5/3-NGTD-prelim -Path 5/3- pending -Blood cultures growing MRSA, appreciate ID assistance. -Follow infectious disease recommendations for antibiotics. -Weightbearing as tolerated in surgical shoe although limited. -Elevation  Fracture right hallux -Patient has neuropathy does not recall injury.  Surgical shoe ordered for offloading.   Please contact me directly with any questions or concerns.     Ovid Curd, DPM [facility]  Dr. Lesia Sago. Annalisse Minkoff, DPM    [address]  [place], [place] [address]                Office [phone]  Fax [phone]

## 2022-09-06 NOTE — Progress Notes (Signed)
  Echocardiogram 2D Echocardiogram has been performed.  Kayla [name] 09/06/2022, 5:15 PM

## 2022-09-06 NOTE — H&P (View-Only) (Signed)
   Rounding Note    Patient Name: Kayla [name] Date of Encounter: 09/06/2022  [facility] Cardiologist: Mark Skains, MD   Subjective   NAEO. Husband at bedside.  Vital Signs    Vitals:   09/05/22 1930 09/05/22 2359 09/06/22 0400 09/06/22 0743  BP: (!) 148/57 137/64 (!) 140/55 (!) 140/50  Pulse: 60 63 (!) 59 64  Resp: (!) 25 16 15 17  Temp:  98.7 F (37.1 C)  97.7 F (36.5 C)  TempSrc:  Oral  Oral  SpO2: 98% 98% 98% 96%  Weight:      Height:        Intake/Output Summary (Last 24 hours) at 09/06/2022 0943 Last data filed at 09/06/2022 0600 Gross per 24 hour  Intake 2151.46 ml  Output 1250 ml  Net 901.46 ml      09/04/2022    4:51 AM 07/23/2022    8:56 AM 07/03/2022    8:40 AM  Last 3 Weights  Weight (lbs) 221 lb 9 oz 237 lb 234 lb  Weight (kg) 100.5 kg 107.502 kg 106.142 kg      Telemetry    Personally Reviewed  ECG    Personally Reviewed  Physical Exam    GEN: No acute distress.  Elderly. Frail. Cardiac: RRR, no murmurs, rubs, or gallops.  Respiratory: Clear to auscultation bilaterally. Psych: Normal affect   Assessment & Plan     #MRSA bacteremia #CIED associated infection The patient has 2 of 2 blood cultures positive for bacteremia.  Microbiology shows MRSA infection.  Given speciation of cultures, device is involved.  She needs a transesophageal echo to assess for valvular vegetations and lead related vegetations.    Infectious disease consult pending.   All of the above will need to be reviewed prior to finalizing her candidacy for pacemaker system extraction.   Stop Eliquis until decision regarding treatment plan for pacemaker associated infection is finalized.  Keep NPO after MN in anticipation of TEE.   Will need to reprogram her device Monday to lower pacing rate to see if reimplant would be necessary. This will also help inform our conversations ith family.  #Anemia Improved.   #Acute on chronic kidney disease Medicine  following.        Delynn Pursley T. Kiam Bransfield, MD, FACC, [facility] Cardiac Electrophysiology  

## 2022-09-06 NOTE — Plan of Care (Signed)

## 2022-09-06 NOTE — Progress Notes (Signed)
Orthopedic Tech Progress Note Patient Details:  Kayla [name] November 29, 1940 [id number]  Ortho Devices Type of Ortho Device: Postop shoe/boot Ortho Device/Splint Location: BLE Ortho Device/Splint Interventions: Ordered, Adjustment   Post Interventions Patient Tolerated: Well Instructions Provided: Care of device, Adjustment of device  [place] A Jenaye Rickert 09/06/2022, 11:58 AM

## 2022-09-06 NOTE — Progress Notes (Signed)
Orthopedic Tech Progress Note Patient Details:  DESTANEE CIRRINCIONE 01-27-1941 [id number]  Patient ID: [name], female   DOB: Sep 08, 1940, 82 y.o.   MRN: [id number] Reached out to RN via secure chat in regards to patients shoe size so I can deliver the correct size POS. Awaiting response.  [place] A Chosen Garron 09/06/2022, 11:30 AM

## 2022-09-07 ENCOUNTER — Inpatient Hospital Stay (HOSPITAL_COMMUNITY): Payer: Medicare HMO

## 2022-09-07 ENCOUNTER — Inpatient Hospital Stay (HOSPITAL_COMMUNITY): Payer: Medicare HMO | Admitting: Anesthesiology

## 2022-09-07 ENCOUNTER — Encounter (HOSPITAL_COMMUNITY)
Admission: EM | Disposition: A | Discharge status: Skilled Nursing Facility | Payer: Self-pay | Source: Ambulatory Visit | Attending: Internal Medicine

## 2022-09-07 ENCOUNTER — Encounter (HOSPITAL_COMMUNITY): Payer: Self-pay | Admitting: Internal Medicine

## 2022-09-07 DIAGNOSIS — E11628 Type 2 diabetes mellitus with other skin complications: Secondary | ICD-10-CM | POA: Diagnosis not present

## 2022-09-07 DIAGNOSIS — E1151 Type 2 diabetes mellitus with diabetic peripheral angiopathy without gangrene: Secondary | ICD-10-CM | POA: Diagnosis not present

## 2022-09-07 DIAGNOSIS — Z87891 Personal history of nicotine dependence: Secondary | ICD-10-CM

## 2022-09-07 DIAGNOSIS — R7881 Bacteremia: Secondary | ICD-10-CM | POA: Diagnosis not present

## 2022-09-07 DIAGNOSIS — I361 Nonrheumatic tricuspid (valve) insufficiency: Secondary | ICD-10-CM

## 2022-09-07 DIAGNOSIS — I10 Essential (primary) hypertension: Secondary | ICD-10-CM | POA: Diagnosis not present

## 2022-09-07 DIAGNOSIS — L089 Local infection of the skin and subcutaneous tissue, unspecified: Secondary | ICD-10-CM | POA: Diagnosis not present

## 2022-09-07 HISTORY — PX: TEE WITHOUT CARDIOVERSION: SHX5443

## 2022-09-07 LAB — CULTURE, BLOOD (ROUTINE X 2)

## 2022-09-07 LAB — BASIC METABOLIC PANEL
Anion gap: 13 (ref 5–15)
BUN: 43 mg/dL — ABNORMAL HIGH (ref 8–23)
CO2: 20 mmol/L — ABNORMAL LOW (ref 22–32)
Calcium: 8.8 mg/dL — ABNORMAL LOW (ref 8.9–10.3)
Chloride: 101 mmol/L (ref 98–111)
Creatinine, Ser: 2.14 mg/dL — ABNORMAL HIGH (ref 0.44–1.00)
GFR, Estimated: 23 mL/min — ABNORMAL LOW (ref >60)
Glucose, Bld: 294 mg/dL — ABNORMAL HIGH (ref 70–99)
Potassium: 4.4 mmol/L (ref 3.5–5.1)
Sodium: 134 mmol/L — ABNORMAL LOW (ref 135–145)

## 2022-09-07 LAB — TYPE AND SCREEN

## 2022-09-07 LAB — BPAM RBC
Blood Product Expiration Date: 202405202359
Blood Product Expiration Date: 202406032359
Blood Product Expiration Date: 202406032359

## 2022-09-07 LAB — AEROBIC CULTURE W GRAM STAIN (SUPERFICIAL SPECIMEN)
Culture: NO GROWTH
Gram Stain: NONE SEEN

## 2022-09-07 LAB — GLUCOSE, CAPILLARY
Glucose-Capillary: 294 mg/dL — ABNORMAL HIGH (ref 70–99)
Glucose-Capillary: 244 mg/dL — ABNORMAL HIGH (ref 70–99)
Glucose-Capillary: 217 mg/dL — ABNORMAL HIGH (ref 70–99)
Glucose-Capillary: 263 mg/dL — ABNORMAL HIGH (ref 70–99)
Glucose-Capillary: 293 mg/dL — ABNORMAL HIGH (ref 70–99)

## 2022-09-07 LAB — ECHO TEE

## 2022-09-07 LAB — PREPARE RBC (CROSSMATCH)

## 2022-09-07 SURGERY — ECHOCARDIOGRAM, TRANSESOPHAGEAL
Anesthesia: Monitor Anesthesia Care

## 2022-09-07 MED ORDER — AMLODIPINE BESYLATE 5 MG PO TABS
5.0000 mg | ORAL_TABLET | Freq: Every day | ORAL | Status: DC
  Administered 2022-09-07 – 2022-09-10 (×4): 5 mg via ORAL
  Filled 2022-09-07 (×4): qty 1

## 2022-09-07 MED ORDER — SODIUM CHLORIDE 0.9 % IV SOLN: INTRAVENOUS | Status: DC

## 2022-09-07 MED ORDER — SODIUM CHLORIDE 0.9% IV SOLUTION: Freq: Once | INTRAVENOUS | Status: DC

## 2022-09-07 MED ORDER — INSULIN ASPART 100 UNIT/ML IJ SOLN
0.0000 [IU] | Freq: Three times a day (TID) | INTRAMUSCULAR | Status: DC
  Administered 2022-09-07 – 2022-09-08 (×2): 8 [IU] via SUBCUTANEOUS
  Administered 2022-09-08: 5 [IU] via SUBCUTANEOUS
  Administered 2022-09-09: 8 [IU] via SUBCUTANEOUS
  Administered 2022-09-09: 3 [IU] via SUBCUTANEOUS
  Administered 2022-09-09: 8 [IU] via SUBCUTANEOUS
  Administered 2022-09-10: 15 [IU] via SUBCUTANEOUS
  Administered 2022-09-10: 8 [IU] via SUBCUTANEOUS
  Administered 2022-09-10: 11 [IU] via SUBCUTANEOUS
  Administered 2022-09-11: 3 [IU] via SUBCUTANEOUS
  Administered 2022-09-11 (×2): 5 [IU] via SUBCUTANEOUS
  Administered 2022-09-12: 3 [IU] via SUBCUTANEOUS
  Administered 2022-09-12 (×2): 11 [IU] via SUBCUTANEOUS
  Administered 2022-09-13: 15 [IU] via SUBCUTANEOUS
  Administered 2022-09-13: 5 [IU] via SUBCUTANEOUS
  Administered 2022-09-13: 11 [IU] via SUBCUTANEOUS
  Administered 2022-09-14: 8 [IU] via SUBCUTANEOUS
  Administered 2022-09-14: 11 [IU] via SUBCUTANEOUS

## 2022-09-07 MED ORDER — INSULIN GLARGINE-YFGN 100 UNIT/ML ~~LOC~~ SOLN
20.0000 [IU] | Freq: Every day | SUBCUTANEOUS | Status: DC
  Administered 2022-09-08: 20 [IU] via SUBCUTANEOUS
  Filled 2022-09-07 (×3): qty 0.2

## 2022-09-07 MED ORDER — PROPOFOL 500 MG/50ML IV EMUL
INTRAVENOUS | Status: DC | PRN
  Administered 2022-09-07: 50 ug/kg/min via INTRAVENOUS

## 2022-09-07 MED ORDER — PROPOFOL 10 MG/ML IV BOLUS
INTRAVENOUS | Status: DC | PRN
  Administered 2022-09-07: 50 mg via INTRAVENOUS

## 2022-09-07 MED ORDER — SODIUM CHLORIDE 0.9 % IV SOLN
8.0000 mg/kg | INTRAVENOUS | Status: DC
  Administered 2022-09-07 – 2022-09-15 (×5): 600 mg via INTRAVENOUS
  Filled 2022-09-07 (×5): qty 12

## 2022-09-07 MED ORDER — LIDOCAINE 2% (20 MG/ML) 5 ML SYRINGE
INTRAMUSCULAR | Status: DC | PRN
  Administered 2022-09-07: 60 mg via INTRAVENOUS

## 2022-09-07 MED ORDER — INSULIN ASPART 100 UNIT/ML IJ SOLN
0.0000 [IU] | Freq: Every day | INTRAMUSCULAR | Status: DC
  Administered 2022-09-07 – 2022-09-09 (×3): 3 [IU] via SUBCUTANEOUS
  Administered 2022-09-10 – 2022-09-13 (×4): 2 [IU] via SUBCUTANEOUS

## 2022-09-07 MED ORDER — SODIUM CHLORIDE 0.9 % IV SOLN: INTRAVENOUS | Status: DC | PRN

## 2022-09-07 NOTE — Care Management Important Message (Signed)
Important Message  Patient Details  Name: Kayla [name] MRN: [id number] Date of Birth: April 03, 1941   Medicare Important Message Given:  Yes     Sherilyn Banker 09/07/2022, 1:35 PM

## 2022-09-07 NOTE — Plan of Care (Signed)
  Problem: Education: Goal: Ability to describe self-care measures that may prevent or decrease complications (Diabetes Survival Skills Education) will improve Outcome: Progressing   Problem: Skin Integrity: Goal: Risk for impaired skin integrity will decrease Outcome: Progressing   Problem: Education: Goal: Knowledge of General Education information will improve Description: Including pain rating scale, medication(s)/side effects and non-pharmacologic comfort measures Outcome: Progressing   Problem: Activity: Goal: Risk for activity intolerance will decrease Outcome: Progressing   Problem: Pain Managment: Goal: General experience of comfort will improve Outcome: Progressing   Problem: Safety: Goal: Ability to remain free from injury will improve Outcome: Progressing   Problem: Skin Integrity: Goal: Risk for impaired skin integrity will decrease Outcome: Progressing   

## 2022-09-07 NOTE — TOC Initial Note (Signed)
Transition of Care [facility]) - Initial/Assessment Note    Patient Details  Name: Kayla [name] MRN: [id number] Date of Birth: 10-Aug-1940  Transition of Care [facility]) CM/SW Contact:    Epifanio Lesches, RN Phone Number: 09/07/2022, 3:31 PM  Clinical Narrative:                 Presents with diabetic foot infection / MRSA bacteremia from home with husband. PTA independent with ADL's. DME:  home oxygen ( pt states has small portable tank for transportation),CPAP.      - S/P  left partial first ray amputation, 5/3      - S/p TEE ,5/6...vegetation on the RV lead at the left of the right atrium   Potential need for home infusion, IV ABX therapy. Pt/husband  both state has had IV ABX therapy in the past  and agreeable to home infusion therapy if needed. Pt without provider preference. Referral made with Pam/[facility]  and Cory/[facility], both accepted pending MD's orders.  Plan: pacemaker extraction   TOC team following and will assist with needs.....   Expected Discharge Plan: Home w Home Health Services Barriers to Discharge: Continued Medical Work up   Patient Goals and CMS Choice     Choice offered to / list presented to : Patient, Spouse      Expected Discharge Plan and Services   Discharge Planning Services: CM Consult                       DME Agency: Other - Comment ([facility]) Date DME Agency Contacted: 09/07/22 Time DME Agency Contacted: 1357 Representative spoke with at DME Agency: Pam HH Arranged: RN HH Agency: [facility] Date [facility] Agency Contacted: 09/07/22 Time HH Agency Contacted: 1358 Representative spoke with at [facility] Agency: Kandee Keen  Prior Living Arrangements/Services   Lives with:: Spouse Patient language and need for interpreter reviewed:: Yes Do you feel safe going back to the place where you live?: Yes      Need for Family Participation in Patient Care: Yes (Comment) Care giver support system in place?: Yes (comment)    Criminal Activity/Legal Involvement Pertinent to Current Situation/Hospitalization: No - Comment as needed  Activities of Daily Living      Permission Sought/Granted                  Emotional Assessment Appearance:: Appears stated age     Orientation: : Oriented to Self, Oriented to Place, Oriented to  Time, Oriented to Situation Alcohol / Substance Use: Not Applicable Psych Involvement: No (comment)  Admission diagnosis:  Diabetic foot infection (HCC) [M01.027, L08.9] Acute renal failure superimposed on chronic kidney disease, unspecified acute renal failure type, unspecified CKD stage (HCC) [N17.9, N18.9] Patient Active Problem List   Diagnosis Date Noted   Closed nondisplaced fracture of proximal phalanx of right great toe 09/06/2022   Pyogenic inflammation of bone (HCC) 09/05/2022   Acute hematogenous osteomyelitis of left foot (HCC) 09/04/2022   Diabetic foot infection (HCC) 09/03/2022   Chronic respiratory failure with hypoxia (HCC) 02/20/2022   Pleuritic pain 02/20/2022   Toe osteomyelitis, left (HCC) 01/20/2022   Carotid bruit 07/07/2021   Septic prepatellar bursitis of right knee 05/30/2021   Cellulitis of right lower extremity 03/19/2021   Chronic kidney disease, stage 3b (HCC) 03/19/2021   Polymyalgia rheumatica (HCC) 04/15/2020   Temporal arteritis (HCC) 04/15/2020   Pacemaker - MDT 01/24/2020   Orthostatic lightheadedness 01/24/2020  Chronic osteomyelitis involving left ankle and foot (HCC)    Paroxysmal atrial fibrillation (HCC) 10/25/2019   Secondary hypercoagulable state (HCC) 10/25/2019   Numbness and tingling of right leg 10/19/2019   Lumbar radiculopathy 10/19/2019   Chronic diastolic heart failure (HCC) 08/13/2019   Chest pain 08/13/2019   Leukocytosis 08/13/2019   Leg swelling 12/09/2018   Urinary frequency 12/09/2018   Pain in joint of right hip 01/21/2018   Myofascial pain syndrome, cervical 01/06/2018   Occipital neuralgia of right side  01/06/2018   RUQ abdominal pain    Gastritis and gastroduodenitis    Bile duct abnormality    Cholecystitis 04/30/2016   Gall bladder disease 04/28/2016   Sinus node dysfunction (HCC) 11/08/2015   Ejection fraction    Spinal stenosis of cervical region 11/07/2014   Preoperative clearance 10/09/2014   Carotid artery disease (HCC)    S/P left TKA 07/27/2011   Dizzy spells 05/04/2011   Palpitations 01/21/2011   RHINITIS 07/21/2010   Hyperlipidemia associated with type 2 diabetes mellitus (HCC) 11/24/2008   OBESITY 11/24/2008   Coronary atherosclerosis 11/24/2008   VENTRICULAR HYPERTROPHY, LEFT 11/24/2008   HYPOTHYROIDISM, POSTSURGICAL 06/04/2008   Headache 06/04/2008   Insulin dependent type 2 diabetes mellitus (HCC) 03/23/2007   Esophageal reflux 03/23/2007   DIVERTICULOSIS OF COLON 03/23/2007   GENERALIZED OSTEOARTHROSIS UNSPECIFIED SITE 03/23/2007   OSA (obstructive sleep apnea) 02/16/2007   Hypertension associated with diabetes (HCC) 02/16/2007   COPD (chronic obstructive pulmonary disease) (HCC) 02/16/2007   PCP:  Ailene Ravel, MD Pharmacy:   [facility] [phone] [place], [place] - [address] AT [facility] [address] [place] [address] Phone: [phone] Fax: [phone]  [facility] - [place], [place] - [address] [place] [address] Phone: [phone] Fax: [phone]     Social Determinants of Health (SDOH) Social History: SDOH Screenings   Food Insecurity: No Food Insecurity (01/20/2022)  Housing: Low Risk  (01/20/2022)  Transportation Needs: No Transportation Needs (01/20/2022)  Utilities: Not At Risk (01/20/2022)  Alcohol Screen: Low Risk  (01/31/2021)  Depression (PHQ2-9): Low Risk  (09/30/2021)  Recent Concern: Depression (PHQ2-9) - Medium Risk (08/18/2021)  Financial Resource Strain: Low Risk  (01/31/2021)  Physical Activity: Insufficiently Active (09/30/2021)  Social  Connections: Socially Integrated (09/30/2021)  Stress: No Stress Concern Present (09/30/2021)  Tobacco Use: Medium Risk (09/07/2022)   SDOH Interventions:     Readmission Risk Interventions    01/22/2022    2:43 PM 07/23/2021   12:10 PM 03/21/2021   12:53 PM  Readmission Risk Prevention Plan  Transportation Screening Complete Complete Complete  PCP or Specialist Appt within 3-5 Days Complete    HRI or Home Care Consult Complete    Social Work Consult for Recovery Care Planning/Counseling Complete    Palliative Care Screening Not Applicable    Medication Review Oceanographer) Complete Complete Complete  HRI or Home Care Consult  Complete   SW Recovery Care/Counseling Consult  Complete   Palliative Care Screening  Not Applicable   Skilled Nursing Facility  Complete

## 2022-09-07 NOTE — Transfer of Care (Signed)
Immediate Anesthesia Transfer of Care Note  Patient: SHONTERIA MASTROGIOVANNI  Procedure(s) Performed: TRANSESOPHAGEAL ECHOCARDIOGRAM  Patient Location: Cath Lab  Anesthesia Type:MAC  Level of Consciousness: awake  Airway & Oxygen Therapy: Patient Spontanous Breathing and Patient connected to nasal cannula oxygen  Post-op Assessment: Report given to RN and Post -op Vital signs reviewed and stable  Post vital signs: Reviewed and stable  Last Vitals:  Vitals Value Taken Time  BP    Temp    Pulse    Resp    SpO2      Last Pain:  Vitals:   09/07/22 1056  TempSrc:   PainSc: 3       Patients Stated Pain Goal: 3 (09/07/22 0730)  Complications: No notable events documented.

## 2022-09-07 NOTE — Anesthesia Preprocedure Evaluation (Signed)
Anesthesia Evaluation  Patient identified by MRN, date of birth, ID band Patient awake    Reviewed: Allergy & Precautions, H&P , NPO status , Patient's Chart, lab work & pertinent test results  Airway Mallampati: II  TM Distance: >3 FB Neck ROM: Full    Dental no notable dental hx.    Pulmonary sleep apnea , COPD, former smoker   breath sounds clear to auscultation + decreased breath sounds      Cardiovascular hypertension, + Peripheral Vascular Disease  Normal cardiovascular exam+ pacemaker  Rhythm:Regular Rate:Normal  1. There is a highly mobile mass visualized on the right atrial lead  mesauring 2.5cmx1.8cm concerning for device lead vegetation (clip 40).  Recommend TEE for further evaluation if clinically indicated.   2. Left ventricular ejection fraction, by estimation, is 60 to 65%. The  left ventricle has normal function. The left ventricle has no regional  wall motion abnormalities. Left ventricular diastolic parameters were  normal.   3. Right ventricular systolic function is normal. The right ventricular  size is mildly enlarged. There is normal pulmonary artery systolic  pressure.   4. Left atrial size was mildly dilated.   5. The mitral valve is grossly normal. Trivial mitral valve  regurgitation.   6. The aortic valve is tricuspid. There is moderate calcification of the  aortic valve. There is moderate thickening of the aortic valve. Aortic  valve regurgitation is trivial. Mild aortic valve stenosis. Aortic valve  mean gradient measures 13.5 mmHg.  Aortic valve Vmax measures 2.46 m/s.   Comparison(s): Compared to prior TTE on 02/2022, there is now concern for  device lead vegetation as detailed above.      Neuro/Psych negative neurological ROS  negative psych ROS   GI/Hepatic negative GI ROS, Neg liver ROS,,,  Endo/Other  diabetesHypothyroidism    Renal/GU negative Renal ROS  negative genitourinary    Musculoskeletal negative musculoskeletal ROS (+)    Abdominal   Peds negative pediatric ROS (+)  Hematology negative hematology ROS (+)   Anesthesia Other Findings   Reproductive/Obstetrics negative OB ROS                             Anesthesia Physical Anesthesia Plan  ASA: 4  Anesthesia Plan: MAC   Post-op Pain Management:    Induction: Intravenous  PONV Risk Score and Plan: Treatment may vary due to age or medical condition and Propofol infusion  Airway Management Planned: Simple Face Mask  Additional Equipment:   Intra-op Plan:   Post-operative Plan:   Informed Consent: I have reviewed the patients History and Physical, chart, labs and discussed the procedure including the risks, benefits and alternatives for the proposed anesthesia with the patient or authorized representative who has indicated his/her understanding and acceptance.     Dental advisory given  Plan Discussed with: CRNA and Surgeon  Anesthesia Plan Comments:        Anesthesia Quick Evaluation

## 2022-09-07 NOTE — CV Procedure (Signed)
    TRANSESOPHAGEAL ECHOCARDIOGRAM   NAME:  Kayla [name]    MRN: [id number] DOB:  24-Nov-1940    ADMIT DATE: 09/03/2022  INDICATIONS: Infective endocarditis eval  PROCEDURE:   Informed consent was obtained prior to the procedure. The risks, benefits and alternatives for the procedure were discussed and the patient comprehended these risks.  Risks include, but are not limited to, cough, sore throat, vomiting, nausea, somnolence, esophageal and stomach trauma or perforation, bleeding, low blood pressure, aspiration, pneumonia, infection, trauma to the teeth and death.    Procedural time out performed. The oropharynx was anesthetized with topical 1% benzocaine.    Anesthesia was administered by Dr. Sandria Manly and team.   The patient's heart rate, blood pressure, and oxygen saturation are monitored continuously during the procedure.   The transesophageal probe was inserted in the esophagus and stomach without difficulty and multiple views were obtained.   COMPLICATIONS:    There were no immediate complications.  KEY FINDINGS:  There is a vegetation on the RV lead at the left of the right atrium.  Full report to follow. Further management per primary team.   Riley Lam, MD [place]  [facility]  12:47 PM

## 2022-09-07 NOTE — Progress Notes (Signed)
  Echocardiogram Echocardiogram Transesophageal has been performed.  Kayla [name] 09/07/2022, 12:22 PM

## 2022-09-07 NOTE — Progress Notes (Signed)
PROGRESS NOTE  Kayla [name]  DOB: 1940-06-15  PCP: Ailene Ravel, MD GNF:[id number]  DOA: 09/03/2022  LOS: 4 days  Hospital Day: 5  Brief narrative: Kayla [name] is a 82 y.o. female with PMH significant for obesity, OSA, DM2, HTN, HLD, CAD, CHF, paroxysmal A-fib on Eliquis, SSS s/p PPM, carotid artery disease, CKD, chronic anemia, COPD, anxiety/depression, GERD, hypothyroidism, diabetic polyneuropathy, chronic venous insufficiency, h/o left toe amputation 5/2, patient was sent to the ED by her podiatrist Dr. Annamary Rummage with concern of left great toe diabetic foot infection possible osteomyelitis seen on x-ray.  In the ED, patient is afebrile, blood pressure 121/48, breathing on 3 L oxygen by nasal cannula Initial labs with WC count 10.7, hemoglobin 9.1, lactic acid 1.1, creatinine 2.85, glucose 250 MRI left foot confirmed osteomyelitis. Started on broad-spectrum IV antibiotics Admitted to [facility] 5/3, s/p left partial first ray amputation.  Subjective: Patient was seen and examined on the morning of 5/5 Lying on bed.  Not in distress.  Multiple family members at bedside No new symptoms.  Hemoglobin improved with blood and PRBC transfusion.  Creatinine improved with hydration.  Assessment and plan: Left diabetic foot infection  Left great toe wound infection with concern for osteomyelitis, POA S/p left partial first ray amputation -5/3 Dr. Ardelle Anton MRSA bacteremia Seen the vascular surgery but revascularization was not needed. Procedure as above. Blood culture drawn on admission is growing MRSA in both sets. Currently on IV antibiotic coverage with IV Rocephin, IV Flagyl, IV vancomycin ID consulted. Continue pain meds  Acute on chronic anemia Iron deficiency anemia Baseline hemoglobin above 9.   Hemoglobin was low at 7.7 on 5/4.  Monitor PRBC transfusion was given with improvement in hemoglobin to 8.2.  Continue to monitor. Recent Labs    01/25/22 0325 09/03/22 1747  09/04/22 0129 09/05/22 0148 09/06/22 0137  HGB 9.3* 9.1* 8.6* 7.7* 8.2*  MCV 97.8 98.3 96.7 97.6 96.7    AKI on CKD 4 suspect prerenal in the setting of poor oral intake. Baseline creatinine less than 2. Presented with creatinine of 2.85, worsened to peak at 3.31, gradually improving, 2.6 and today.  Continue normal send hydration 24 hours. Continue to monitor.  Chronic diastolic CHF Essential hypertension PTA Toprol, amlodipine, torsemide, losartan Currently on Toprol.  Others on hold.  Continue to monitor blood pressure.  Paroxysmal A-fib  H/o SSS s/p PPM PTA on Toprol, amiodarone, Eliquis.   Continue Toprol and amiodarone. Eliquis was switched to heparin drip for surgery. Currently both on hold because of anemia and pending plan of pacemaker lead extraction.  CAD/HLD Continue statin.  Anticoagulation plan as above.  Type 2 diabetes with hyperglycemia Diabetic polyneuropathy A1c 6.8 on 09/04/2022 Currently on Semglee 15 units daily and sliding scale insulin with Accu-Cheks. Blood sugar level gradually improving.  Continue to monitor Continue gabapentin  Hypothyroidism Synthroid  Obesity  Body mass index is 36.87 kg/m. Patient has been advised to make an attempt to improve diet and exercise patterns to aid in weight loss.   Mobility: Needs PT eval postprocedure  Goals of care   Code Status: DNR    DVT prophylaxis: Heparin drip currently heparin injection 5,000 Units Start: 09/05/22 1400  Antimicrobials: Broad-spectrum IV antibiotics Fluid: None Consultants: Podiatry, vascular surgery, ID Family Communication: Husband at bedside  Status: Inpatient Level of care:  Telemetry Surgical   Patient from: Home Anticipated d/c to: Pending clinical course Needs to continue in-hospital care:  MRSA bacteremia.  Needs further workup and treatment  inpatient   Diet:  Diet Order             Diet NPO time specified  Diet effective midnight                    Scheduled Meds:  amiodarone  200 mg Oral Daily   atorvastatin  40 mg Oral Daily   Chlorhexidine Gluconate Cloth  6 each Topical Q0600   vitamin D3  1,000 Units Oral Q lunch   cyanocobalamin  1,000 mcg Oral Q lunch   gabapentin  200 mg Oral TID   heparin injection (subcutaneous)  5,000 Units Subcutaneous Q8H   insulin aspart  0-5 Units Subcutaneous QHS   insulin aspart  0-9 Units Subcutaneous TID WC   insulin glargine-yfgn  15 Units Subcutaneous Daily   levothyroxine  200 mcg Oral QAC breakfast   metoprolol succinate  100 mg Oral Daily   mupirocin ointment  1 Application Nasal BID    PRN meds: acetaminophen, albuterol, HYDROcodone-acetaminophen, HYDROmorphone (DILAUDID) injection, melatonin, polyethylene glycol, prochlorperazine   Infusions:   sodium chloride 75 mL/hr at 09/06/22 0600   cefTRIAXone (ROCEPHIN)  IV 2 g (09/06/22 1019)   And   metronidazole 500 mg (09/06/22 2144)   sodium chloride     vancomycin 750 mg (09/06/22 1145)    Antimicrobials: Anti-infectives (From admission, onward)    Start     Dose/Rate Route Frequency Ordered Stop   09/07/22 1000  vancomycin (VANCOREADY) IVPB 1500 mg/300 mL  Status:  Discontinued        1,500 mg 150 mL/hr over 120 Minutes Intravenous every 72 hours 09/04/22 0105 09/05/22 1019   09/06/22 1200  vancomycin (VANCOREADY) IVPB 750 mg/150 mL        750 mg 150 mL/hr over 60 Minutes Intravenous Every 48 hours 09/05/22 1019     09/04/22 2200  metroNIDAZOLE (FLAGYL) IVPB 500 mg       See Hyperspace for full Linked Orders Report.   500 mg 100 mL/hr over 60 Minutes Intravenous Every 12 hours 09/04/22 1022 09/10/22 2159   09/04/22 1100  cefTRIAXone (ROCEPHIN) 2 g in sodium chloride 0.9 % 100 mL IVPB       See Hyperspace for full Linked Orders Report.   2 g 200 mL/hr over 30 Minutes Intravenous Every 24 hours 09/04/22 1022 09/10/22 1059   09/04/22 0200  vancomycin (VANCOREADY) IVPB 2000 mg/400 mL        2,000 mg 200 mL/hr over 120  Minutes Intravenous  Once 09/04/22 0105 09/04/22 0626   09/03/22 2230  cefTRIAXone (ROCEPHIN) 2 g in sodium chloride 0.9 % 100 mL IVPB  Status:  Discontinued       See Hyperspace for full Linked Orders Report.   2 g 200 mL/hr over 30 Minutes Intravenous Every 24 hours 09/03/22 2218 09/04/22 1022   09/03/22 2230  metroNIDAZOLE (FLAGYL) IVPB 500 mg  Status:  Discontinued       See Hyperspace for full Linked Orders Report.   500 mg 100 mL/hr over 60 Minutes Intravenous Every 12 hours 09/03/22 2218 09/04/22 1022       Nutritional status:  Body mass index is 36.87 kg/m.          Objective: Vitals:   09/06/22 2135   BP: (!) 164/50   Pulse: 60   Resp: 18   Temp: 98.4 F (36.9 C)   SpO2: 95%     Filed Weights   09/04/22 0451  Weight: 100.5 kg  Weight change:  Body mass index is 36.87 kg/m.   Physical Exam: General exam: Pleasant, elderly Caucasian female.  Not in distress. Skin: No rashes, lesions or ulcers. HEENT: Atraumatic, normocephalic, no obvious bleeding Lungs: Clear to auscultation bilaterally CVS: Regular rate and rhythm, no murmur GI/Abd soft, nontender, nondistended, bowel present CNS: Alert, awake, oriented x 3 Psychiatry: Mood improved Extremities: No pedal edema, no calf tenderness.  Status post left great toe amputation   Data Review: I have personally reviewed the laboratory data and studies available.  F/u labs ordered Unresulted Labs (From admission, onward)     Start     Ordered   09/06/22 0944  Miscellaneous [facility] test (send-out)  Add-on,   AD       Comments: dapto sens on blood cx 5/2   Question:  Test name / description:  Answer:  [id number]   09/06/22 0944   09/06/22 0907  Culture, blood (Routine X 2) w Reflex to ID Panel  BLOOD CULTURE X 2,   R (with TIMED occurrences)      09/06/22 0907   09/06/22 0500  Basic metabolic panel  Daily,   R      09/05/22 0759            Total time spent in review of labs and imaging, patient  evaluation, formulation of plan, documentation and communication with family: 45 minutes  Signed, Lorin Glass, MD [facility]

## 2022-09-07 NOTE — Progress Notes (Signed)
PROGRESS NOTE  Kayla [name]  DOB: [birth year]  PCP: Ailene Ravel, MD HYQ:[id number]  DOA: 09/03/2022  LOS: 4 days  Hospital Day: 5  Brief narrative: Kayla [name] is a 82 y.o. female with PMH significant for obesity, OSA, DM2, HTN, HLD, CAD, CHF, paroxysmal A-fib on Eliquis, SSS s/p PPM, carotid artery disease, CKD, chronic anemia, COPD, anxiety/depression, GERD, hypothyroidism, diabetic polyneuropathy, chronic venous insufficiency, h/o left toe amputation 5/2, patient was sent to the ED by her podiatrist Dr. Annamary Rummage with concern of left great toe diabetic foot infection possible osteomyelitis seen on x-ray.  In the ED, patient is afebrile, blood pressure 121/48, breathing on 3 L oxygen by nasal cannula Initial labs with WC count 10.7, hemoglobin 9.1, lactic acid 1.1, creatinine 2.85, glucose 250 MRI left foot confirmed osteomyelitis. Started on broad-spectrum IV antibiotics Admitted to [facility]. 5/3, s/p left partial first ray amputation.  Subjective: Patient was seen and examined this morning.  Lying on bed.  Not in distress.  Was n.p.o. for TEE.  TEE completed later this morning.  Per report, patient had a vegetation on the RV lead at the left of the right atrium.  Assessment and plan: Left diabetic foot infection  Left great toe wound infection with concern for osteomyelitis, POA S/p left partial first ray amputation -5/3 Dr. Ardelle Anton Seen the vascular surgery but revascularization was not needed. Procedure as above.  MRSA bacteremia Vegetation in pacemaker lead Blood culture drawn on admission is growing MRSA in both sets. Currently on IV antibiotic coverage with IV Rocephin, IV Flagyl, IV vancomycin TEE 5/6 shows a vegetation on RV lead Cardiology has a tentative plan of pacemaker extraction. Eliquis on hold.  Acute on chronic anemia Iron deficiency anemia Baseline hemoglobin above 9.   Hemoglobin was low at 7.7 on 5/4.  Monitor PRBC transfusion was given with  improvement in hemoglobin to 8.2.  Continue to monitor.  Repeat hemoglobin tomorrow Recent Labs    01/25/22 0325 09/03/22 1747 09/04/22 0129 09/05/22 0148 09/06/22 0137  HGB 9.3* 9.1* 8.6* 7.7* 8.2*  MCV 97.8 98.3 96.7 97.6 96.7   AKI on CKD 4 suspect prerenal in the setting of poor oral intake. Baseline creatinine less than 2. Presented with creatinine of 2.85, worsened to peak at 3.31, gradually improving, 2.14 today.  Stop IV fluid this morning Continue to monitor. Recent Labs    01/23/22 0357 01/24/22 0318 01/25/22 0325 02/05/22 1037 02/16/22 1008 09/03/22 1747 09/04/22 0129 09/05/22 0148 09/06/22 0137 09/07/22 0127  BUN 62* 62* 65* 45* 47* 54* 60* 57* 53* 43*  CREATININE 2.03* 2.12* 1.89* 1.86* 2.09* 2.85* 2.94* 3.31* 2.69* 2.14*   Chronic diastolic CHF Essential hypertension PTA Toprol, amlodipine, torsemide, losartan Currently on Toprol.  Others on hold.  Blood pressure rising up, 160s.  Resume amlodipine 5 mg daily.  Paroxysmal A-fib  H/o SSS s/p PPM PTA on Toprol, amiodarone, Eliquis.   Continue Toprol and amiodarone. Eliquis on hold because of anemia and pending plan of pacemaker lead extraction.  CAD/HLD Continue statin.  Anticoagulation plan as above.  Type 2 diabetes with hyperglycemia Diabetic polyneuropathy A1c 6.8 on 09/04/2022 Currently on Semglee 15 units daily and sliding scale insulin with Accu-Cheks. Blood sugar level rising up.  Increase Semglee to 20 units daily.  Switch to moderate SSI/Accu-Cheks.   Continue gabapentin Recent Labs  Lab 09/06/22 0739 09/06/22 1134 09/06/22 1619 09/06/22 2131 09/07/22 0725  GLUCAP 181* 256* 226* 241* 294*   Hypothyroidism Synthroid  Obesity  Body mass index  is 36.87 kg/m. Patient has been advised to make an attempt to improve diet and exercise patterns to aid in weight loss.   Mobility: Needs PT eval postprocedure  Goals of care   Code Status: DNR    DVT prophylaxis: Heparin drip  currently heparin injection 5,000 Units Start: 09/05/22 1400  Antimicrobials: Broad-spectrum IV antibiotics Fluid: None Consultants: Podiatry, vascular surgery, ID, cardiology Family Communication: Husband at bedside  Status: Inpatient Level of care:  Telemetry Surgical   Patient from: Home Anticipated d/c to: Pending clinical course Needs to continue in-hospital care:  MRSA bacteremia.  Needs further workup and treatment inpatient   Diet:  Diet Order             Diet NPO time specified  Diet effective midnight                   Scheduled Meds:  amiodarone  200 mg Oral Daily   amLODipine  5 mg Oral Daily   atorvastatin  40 mg Oral Daily   Chlorhexidine Gluconate Cloth  6 each Topical Q0600   vitamin D3  1,000 Units Oral Q lunch   cyanocobalamin  1,000 mcg Oral Q lunch   gabapentin  200 mg Oral TID   heparin injection (subcutaneous)  5,000 Units Subcutaneous Q8H   insulin aspart  0-15 Units Subcutaneous TID WC   insulin aspart  0-5 Units Subcutaneous QHS   [START ON 09/08/2022] insulin glargine-yfgn  20 Units Subcutaneous Daily   levothyroxine  200 mcg Oral QAC breakfast   metoprolol succinate  100 mg Oral Daily   mupirocin ointment  1 Application Nasal BID    PRN meds: acetaminophen, albuterol, HYDROcodone-acetaminophen, HYDROmorphone (DILAUDID) injection, melatonin, polyethylene glycol, prochlorperazine   Infusions:   sodium chloride     vancomycin 750 mg (09/06/22 1145)    Antimicrobials: Anti-infectives (From admission, onward)    Start     Dose/Rate Route Frequency Ordered Stop   09/07/22 1000  vancomycin (VANCOREADY) IVPB 1500 mg/300 mL  Status:  Discontinued        1,500 mg 150 mL/hr over 120 Minutes Intravenous every 72 hours 09/04/22 0105 09/05/22 1019   09/06/22 1200  vancomycin (VANCOREADY) IVPB 750 mg/150 mL        750 mg 150 mL/hr over 60 Minutes Intravenous Every 48 hours 09/05/22 1019     09/04/22 2200  metroNIDAZOLE (FLAGYL) IVPB 500 mg   Status:  Discontinued       See Hyperspace for full Linked Orders Report.   500 mg 100 mL/hr over 60 Minutes Intravenous Every 12 hours 09/04/22 1022 09/07/22 0830   09/04/22 1100  cefTRIAXone (ROCEPHIN) 2 g in sodium chloride 0.9 % 100 mL IVPB  Status:  Discontinued       See Hyperspace for full Linked Orders Report.   2 g 200 mL/hr over 30 Minutes Intravenous Every 24 hours 09/04/22 1022 09/07/22 0830   09/04/22 0200  vancomycin (VANCOREADY) IVPB 2000 mg/400 mL        2,000 mg 200 mL/hr over 120 Minutes Intravenous  Once 09/04/22 0105 09/04/22 0626   09/03/22 2230  cefTRIAXone (ROCEPHIN) 2 g in sodium chloride 0.9 % 100 mL IVPB  Status:  Discontinued       See Hyperspace for full Linked Orders Report.   2 g 200 mL/hr over 30 Minutes Intravenous Every 24 hours 09/03/22 2218 09/04/22 1022   09/03/22 2230  metroNIDAZOLE (FLAGYL) IVPB 500 mg  Status:  Discontinued  See Hyperspace for full Linked Orders Report.   500 mg 100 mL/hr over 60 Minutes Intravenous Every 12 hours 09/03/22 2218 09/04/22 1022       Nutritional status:  Body mass index is 36.87 kg/m.          Objective: Vitals:   09/06/22 2135 09/07/22 0727  BP: (!) 164/50 (!) 166/50  Pulse: 60 62  Resp: 18 18  Temp: 98.4 F (36.9 C) 98.6 F (37 C)  SpO2: 95% 98%    Intake/Output Summary (Last 24 hours) at 09/07/2022 0935 Last data filed at 09/07/2022 0437 Gross per 24 hour  Intake 649.68 ml  Output 1000 ml  Net -350.32 ml   Filed Weights   09/04/22 0451  Weight: 100.5 kg   Weight change:  Body mass index is 36.87 kg/m.   Physical Exam: General exam: Pleasant, elderly Caucasian female. Not in distress. Skin: No rashes, lesions or ulcers. HEENT: Atraumatic, normocephalic, no obvious bleeding Lungs: Clear to auscultation bilaterally CVS: Regular rate and rhythm, no murmur GI/Abd soft, nontender, nondistended, bowel present CNS: Alert, awake, oriented x 3 Psychiatry: Mood improved Extremities: No  pedal edema, no calf tenderness.  Status post left great toe amputation   Data Review: I have personally reviewed the laboratory data and studies available.  F/u labs ordered Unresulted Labs (From admission, onward)     Start     Ordered   09/08/22 0500  CBC with Differential/Platelet  Tomorrow morning,   R        09/07/22 0934   09/06/22 0944  Miscellaneous [facility] test (send-out)  Add-on,   AD       Comments: dapto sens on blood cx 5/2   Question:  Test name / description:  Answer:  [id number]   09/06/22 0944   09/06/22 0907  Culture, blood (Routine X 2) w Reflex to ID Panel  BLOOD CULTURE X 2,   R (with TIMED occurrences)      09/06/22 0907   09/06/22 0500  Basic metabolic panel  Daily,   R      09/05/22 0759            Total time spent in review of labs and imaging, patient evaluation, formulation of plan, documentation and communication with family: 45 minutes  Signed, Lorin Glass, MD [facility] 09/07/2022

## 2022-09-07 NOTE — Progress Notes (Signed)
Patient Name: Kayla [name] Date of Encounter: 09/07/2022  Primary Cardiologist: Donato Schultz, MD Electrophysiologist: Sherryl Manges, MD  Interval Summary   The patient is doing well today.  At this time, the patient denies chest pain, shortness of breath, or any new concerns.  Inpatient Medications    Scheduled Meds:  amiodarone  200 mg Oral Daily   amLODipine  5 mg Oral Daily   atorvastatin  40 mg Oral Daily   Chlorhexidine Gluconate Cloth  6 each Topical Q0600   vitamin D3  1,000 Units Oral Q lunch   cyanocobalamin  1,000 mcg Oral Q lunch   gabapentin  200 mg Oral TID   heparin injection (subcutaneous)  5,000 Units Subcutaneous Q8H   insulin aspart  0-15 Units Subcutaneous TID WC   insulin aspart  0-5 Units Subcutaneous QHS   [START ON 09/08/2022] insulin glargine-yfgn  20 Units Subcutaneous Daily   levothyroxine  200 mcg Oral QAC breakfast   metoprolol succinate  100 mg Oral Daily   mupirocin ointment  1 Application Nasal BID   Continuous Infusions:  sodium chloride     vancomycin 750 mg (09/06/22 1145)   PRN Meds: acetaminophen, albuterol, HYDROcodone-acetaminophen, HYDROmorphone (DILAUDID) injection, melatonin, polyethylene glycol, prochlorperazine   Vital Signs    Vitals:   09/06/22 0400 09/06/22 0743 09/06/22 2135 09/07/22 0727  BP: (!) 140/55 (!) 140/50 (!) 164/50 (!) 166/50  Pulse: (!) 59 64 60 62  Resp: 15 17 18 18   Temp:  97.7 F (36.5 C) 98.4 F (36.9 C) 98.6 F (37 C)  TempSrc:  Oral Oral Oral  SpO2: 98% 96% 95% 98%  Weight:      Height:        Intake/Output Summary (Last 24 hours) at 09/07/2022 0949 Last data filed at 09/07/2022 0437 Gross per 24 hour  Intake 649.68 ml  Output 1000 ml  Net -350.32 ml   Filed Weights   09/04/22 0451  Weight: 100.5 kg    Physical Exam    GEN- The patient is well appearing, alert and oriented x 3 today.   Lungs- Clear to ausculation bilaterally, normal work of breathing Cardiac- Regular rate and rhythm, no  murmurs, rubs or gallops GI- soft, NT, ND, + BS Extremities- no clubbing or cyanosis. No edema  Telemetry    Atrial pacing 60s (personally reviewed)  Hospital Course    Kayla [name] is a 82 year old woman who I am seeing today for possible CIED associated bacteremia at the request of Dr. Pola Corn.  [name], diabetic polyneuropathy, sick sinus syndrome post permanent pacemaker implant, hyperlipidemia, obesity, chronic venous insufficiency, COPD, gout, iron deficiency anemia, hypertension, paroxysmal atrial fibrillation on Eliquis.  She was admitted with left great toe diabetic foot infection and has recently undergone amputation of her great toe.  During the hospitalization blood cultures have returned positive (2 of 2) for MRSA.   Assessment & Plan    MRSA bacteremia CIED associated infection 2/2 + BCx for MRSA bacteremia Pending TEE this afternoon.     All of the above will need to be reviewed prior to finalizing her candidacy for pacemaker system extraction.   Eliquis on hold pending treatment plan for pacemaker associated infection is finalized.     Anemia Hgb 8.2 5/5. Plts 457 5/5.   Acute on chronic kidney disease Cr 2.1 this am.   For questions or updates, please contact [facility] Please consult [email or web address] for contact info under Cardiology/STEMI.  Signed,  Graciella Freer, PA-C  09/07/2022, 9:49 AM

## 2022-09-07 NOTE — Interval H&P Note (Signed)
History and Physical Interval Note:  09/07/2022 9:54 AM  Kayla [name]  has presented today for surgery, with the diagnosis of bacterimia.  The various methods of treatment have been discussed with the patient and family. After consideration of risks, benefits and other options for treatment, the patient has consented to  Procedure(s): TRANSESOPHAGEAL ECHOCARDIOGRAM (N/A) as a surgical intervention.  The patient's history has been reviewed, patient examined, no change in status, stable for surgery.  I have reviewed the patient's chart and labs.  Questions were answered to the patient's satisfaction.     Jayliani Wanner A Tanya Crothers

## 2022-09-08 ENCOUNTER — Other Ambulatory Visit: Payer: Self-pay

## 2022-09-08 ENCOUNTER — Inpatient Hospital Stay (HOSPITAL_COMMUNITY): Payer: Medicare HMO

## 2022-09-08 ENCOUNTER — Inpatient Hospital Stay (HOSPITAL_COMMUNITY)
Admission: EM | Disposition: A | Discharge status: Skilled Nursing Facility | Payer: Self-pay | Source: Ambulatory Visit | Attending: Internal Medicine

## 2022-09-08 ENCOUNTER — Inpatient Hospital Stay (HOSPITAL_COMMUNITY): Payer: Medicare HMO | Admitting: Anesthesiology

## 2022-09-08 ENCOUNTER — Encounter (HOSPITAL_COMMUNITY): Payer: Self-pay | Admitting: Internal Medicine

## 2022-09-08 DIAGNOSIS — L089 Local infection of the skin and subcutaneous tissue, unspecified: Secondary | ICD-10-CM | POA: Diagnosis not present

## 2022-09-08 DIAGNOSIS — I2510 Atherosclerotic heart disease of native coronary artery without angina pectoris: Secondary | ICD-10-CM | POA: Diagnosis not present

## 2022-09-08 DIAGNOSIS — I509 Heart failure, unspecified: Secondary | ICD-10-CM

## 2022-09-08 DIAGNOSIS — E11628 Type 2 diabetes mellitus with other skin complications: Secondary | ICD-10-CM | POA: Diagnosis not present

## 2022-09-08 DIAGNOSIS — I4891 Unspecified atrial fibrillation: Secondary | ICD-10-CM

## 2022-09-08 DIAGNOSIS — Z794 Long term (current) use of insulin: Secondary | ICD-10-CM | POA: Diagnosis not present

## 2022-09-08 DIAGNOSIS — I110 Hypertensive heart disease with heart failure: Secondary | ICD-10-CM | POA: Diagnosis not present

## 2022-09-08 DIAGNOSIS — T82897A Other specified complication of cardiac prosthetic devices, implants and grafts, initial encounter: Secondary | ICD-10-CM

## 2022-09-08 DIAGNOSIS — E1149 Type 2 diabetes mellitus with other diabetic neurological complication: Secondary | ICD-10-CM

## 2022-09-08 DIAGNOSIS — T827XXA Infection and inflammatory reaction due to other cardiac and vascular devices, implants and grafts, initial encounter: Principal | ICD-10-CM

## 2022-09-08 DIAGNOSIS — Z87891 Personal history of nicotine dependence: Secondary | ICD-10-CM

## 2022-09-08 DIAGNOSIS — B9562 Methicillin resistant Staphylococcus aureus infection as the cause of diseases classified elsewhere: Secondary | ICD-10-CM | POA: Diagnosis not present

## 2022-09-08 HISTORY — PX: LEAD EXTRACTION: EP1211

## 2022-09-08 LAB — BASIC METABOLIC PANEL
Anion gap: 10 (ref 5–15)
BUN: 40 mg/dL — ABNORMAL HIGH (ref 8–23)
CO2: 24 mmol/L (ref 22–32)
Calcium: 8.9 mg/dL (ref 8.9–10.3)
Chloride: 98 mmol/L (ref 98–111)
Creatinine, Ser: 1.99 mg/dL — ABNORMAL HIGH (ref 0.44–1.00)
GFR, Estimated: 25 mL/min — ABNORMAL LOW (ref >60)
Glucose, Bld: 263 mg/dL — ABNORMAL HIGH (ref 70–99)
Potassium: 4.1 mmol/L (ref 3.5–5.1)
Sodium: 132 mmol/L — ABNORMAL LOW (ref 135–145)

## 2022-09-08 LAB — GLUCOSE, CAPILLARY
Glucose-Capillary: 252 mg/dL — ABNORMAL HIGH (ref 70–99)
Glucose-Capillary: 205 mg/dL — ABNORMAL HIGH (ref 70–99)
Glucose-Capillary: 211 mg/dL — ABNORMAL HIGH (ref 70–99)
Glucose-Capillary: 256 mg/dL — ABNORMAL HIGH (ref 70–99)

## 2022-09-08 LAB — CBC WITH DIFFERENTIAL/PLATELET
Abs Immature Granulocytes: 0.21 10*3/uL — ABNORMAL HIGH (ref 0.00–0.07)
Basophils Absolute: 0.1 10*3/uL (ref 0.0–0.1)
Basophils Relative: 1 %
Eosinophils Absolute: 0.2 10*3/uL (ref 0.0–0.5)
Eosinophils Relative: 2 %
HCT: 25.9 % — ABNORMAL LOW (ref 36.0–46.0)
Hemoglobin: 8 g/dL — ABNORMAL LOW (ref 12.0–15.0)
Immature Granulocytes: 2 %
Lymphocytes Relative: 13 %
Lymphs Abs: 1.5 10*3/uL (ref 0.7–4.0)
MCH: 29.7 pg (ref 26.0–34.0)
MCHC: 30.9 g/dL (ref 30.0–36.0)
MCV: 96.3 fL (ref 80.0–100.0)
Monocytes Absolute: 1.1 10*3/uL — ABNORMAL HIGH (ref 0.1–1.0)
Monocytes Relative: 9 %
Neutro Abs: 8.8 10*3/uL — ABNORMAL HIGH (ref 1.7–7.7)
Neutrophils Relative %: 73 %
Platelets: 471 10*3/uL — ABNORMAL HIGH (ref 150–400)
RBC: 2.69 MIL/uL — ABNORMAL LOW (ref 3.87–5.11)
RDW: 14.5 % (ref 11.5–15.5)
WBC: 11.9 10*3/uL — ABNORMAL HIGH (ref 4.0–10.5)
nRBC: 0 % (ref 0.0–0.2)

## 2022-09-08 LAB — CK: Total CK: 58 U/L (ref 38–234)

## 2022-09-08 LAB — ECHO INTRAOPERATIVE TEE
Height: 65 in
Weight: 3710.78 oz

## 2022-09-08 LAB — PREPARE RBC (CROSSMATCH)

## 2022-09-08 LAB — SURGICAL PATHOLOGY

## 2022-09-08 SURGERY — LEAD EXTRACTION
Anesthesia: General

## 2022-09-08 MED ORDER — SODIUM CHLORIDE 0.9 % IV SOLN: INTRAVENOUS | Status: DC

## 2022-09-08 MED ORDER — NITROGLYCERIN 1 MG/10 ML FOR IR/CATH LAB
INTRA_ARTERIAL | Status: AC
  Filled 2022-09-08: qty 10

## 2022-09-08 MED ORDER — PROPOFOL 10 MG/ML IV BOLUS
INTRAVENOUS | Status: DC | PRN
  Administered 2022-09-08: 100 mg via INTRAVENOUS

## 2022-09-08 MED ORDER — VASOPRESSIN 20 UNIT/ML IV SOLN
INTRAVENOUS | Status: AC
  Filled 2022-09-08: qty 1

## 2022-09-08 MED ORDER — INSULIN ASPART 100 UNIT/ML IJ SOLN
INTRAMUSCULAR | Status: AC
  Filled 2022-09-08: qty 1

## 2022-09-08 MED ORDER — HEPARIN (PORCINE) IN NACL 1000-0.9 UT/500ML-% IV SOLN
INTRAVENOUS | Status: DC | PRN
  Administered 2022-09-08: 500 mL

## 2022-09-08 MED ORDER — CEFAZOLIN SODIUM-DEXTROSE 2-3 GM-%(50ML) IV SOLR
INTRAVENOUS | Status: DC | PRN
  Administered 2022-09-08: 2 g via INTRAVENOUS

## 2022-09-08 MED ORDER — ONDANSETRON HCL 4 MG/2ML IJ SOLN
INTRAMUSCULAR | Status: DC | PRN
  Administered 2022-09-08: 4 mg via INTRAVENOUS

## 2022-09-08 MED ORDER — FENTANYL CITRATE (PF) 100 MCG/2ML IJ SOLN
INTRAMUSCULAR | Status: AC
  Filled 2022-09-08: qty 2

## 2022-09-08 MED ORDER — ACETAMINOPHEN 500 MG PO TABS
500.0000 mg | ORAL_TABLET | Freq: Three times a day (TID) | ORAL | Status: DC | PRN
  Administered 2022-09-08 – 2022-09-10 (×3): 500 mg via ORAL
  Filled 2022-09-08 (×3): qty 1

## 2022-09-08 MED ORDER — ROCURONIUM BROMIDE 10 MG/ML (PF) SYRINGE
PREFILLED_SYRINGE | INTRAVENOUS | Status: DC | PRN
  Administered 2022-09-08: 20 mg via INTRAVENOUS
  Administered 2022-09-08: 50 mg via INTRAVENOUS
  Administered 2022-09-08: 10 mg via INTRAVENOUS

## 2022-09-08 MED ORDER — SODIUM CHLORIDE 0.9 % IV SOLN: INTRAVENOUS | Status: DC | PRN

## 2022-09-08 MED ORDER — SUGAMMADEX SODIUM 200 MG/2ML IV SOLN
INTRAVENOUS | Status: DC | PRN
  Administered 2022-09-08: 300 mg via INTRAVENOUS

## 2022-09-08 MED ORDER — SODIUM CHLORIDE 0.9 % IV SOLN: 80.0000 mg | INTRAVENOUS | Status: AC

## 2022-09-08 MED ORDER — SODIUM CHLORIDE 0.9 % IV SOLN
INTRAVENOUS | Status: AC
  Administered 2022-09-08: 80 mg
  Filled 2022-09-08: qty 2

## 2022-09-08 MED ORDER — ALBUTEROL SULFATE HFA 108 (90 BASE) MCG/ACT IN AERS
INHALATION_SPRAY | RESPIRATORY_TRACT | Status: DC | PRN
  Administered 2022-09-08: 4 via RESPIRATORY_TRACT

## 2022-09-08 MED ORDER — INSULIN ASPART 100 UNIT/ML IJ SOLN
0.0000 [IU] | INTRAMUSCULAR | Status: DC | PRN
  Administered 2022-09-08: 2 [IU] via SUBCUTANEOUS

## 2022-09-08 MED ORDER — CHLORHEXIDINE GLUCONATE 0.12 % MT SOLN
OROMUCOSAL | Status: AC
  Administered 2022-09-08: 15 mL
  Filled 2022-09-08: qty 15

## 2022-09-08 MED ORDER — LIDOCAINE 2% (20 MG/ML) 5 ML SYRINGE
INTRAMUSCULAR | Status: DC | PRN
  Administered 2022-09-08: 60 mg via INTRAVENOUS

## 2022-09-08 MED ORDER — CHLORHEXIDINE GLUCONATE 4 % EX SOLN
60.0000 mL | Freq: Once | CUTANEOUS | Status: AC
  Administered 2022-09-08: 4 via TOPICAL
  Filled 2022-09-08: qty 15

## 2022-09-08 MED ORDER — CHLORHEXIDINE GLUCONATE 4 % EX SOLN
60.0000 mL | Freq: Once | CUTANEOUS | Status: AC
  Administered 2022-09-08: 4 via TOPICAL

## 2022-09-08 MED ORDER — PHENYLEPHRINE HCL-NACL 20-0.9 MG/250ML-% IV SOLN
INTRAVENOUS | Status: DC | PRN
  Administered 2022-09-08: 10 ug/min via INTRAVENOUS

## 2022-09-08 MED ORDER — ACETAMINOPHEN 325 MG PO TABS: 325.0000 mg | ORAL_TABLET | ORAL | Status: DC | PRN

## 2022-09-08 MED ORDER — SODIUM CHLORIDE 0.9% IV SOLUTION: Freq: Once | INTRAVENOUS | Status: DC

## 2022-09-08 MED ORDER — FENTANYL CITRATE (PF) 250 MCG/5ML IJ SOLN
INTRAMUSCULAR | Status: DC | PRN
  Administered 2022-09-08: 50 ug via INTRAVENOUS

## 2022-09-08 MED ORDER — CEFAZOLIN SODIUM-DEXTROSE 2-4 GM/100ML-% IV SOLN
INTRAVENOUS | Status: AC
  Filled 2022-09-08: qty 100

## 2022-09-08 MED ORDER — ONDANSETRON HCL 4 MG/2ML IJ SOLN
4.0000 mg | Freq: Four times a day (QID) | INTRAMUSCULAR | Status: DC | PRN
  Administered 2022-09-08: 4 mg via INTRAVENOUS
  Filled 2022-09-08: qty 2

## 2022-09-08 MED ORDER — CEFAZOLIN SODIUM-DEXTROSE 2-4 GM/100ML-% IV SOLN
2.0000 g | INTRAVENOUS | Status: AC
  Filled 2022-09-08: qty 100

## 2022-09-08 MED ORDER — ALBUTEROL SULFATE (2.5 MG/3ML) 0.083% IN NEBU
INHALATION_SOLUTION | RESPIRATORY_TRACT | Status: AC
  Filled 2022-09-08: qty 3

## 2022-09-08 MED ORDER — EPHEDRINE SULFATE-NACL 50-0.9 MG/10ML-% IV SOSY
PREFILLED_SYRINGE | INTRAVENOUS | Status: DC | PRN
  Administered 2022-09-08: 5 mg via INTRAVENOUS

## 2022-09-08 MED ORDER — LACTATED RINGERS IV SOLN: INTRAVENOUS | Status: DC | PRN

## 2022-09-08 SURGICAL SUPPLY — 14 items
CABLE SURGICAL S-101-97-12 (CABLE) IMPLANT
COIL ONE TIE COMPRESSION (VASCULAR PRODUCTS) IMPLANT
EXTENDER BULLDOG LEAD (MISCELLANEOUS) IMPLANT
KIT LEAD ACCESSORY 6056M PINCH (KITS) IMPLANT
KIT WRENCH (KITS) IMPLANT
LEAD CAPSURE NOVUS 5076-58CM (Lead) IMPLANT
PAD DEFIB RADIO PHYSIO CONN (PAD) IMPLANT
PIN PLUG IS-1 DEFIB (PIN) IMPLANT
SHEATH 9.5FR PRELUDE SNAP 13 (SHEATH) IMPLANT
SHEATH EVOLUTION RL 11F (SHEATH) IMPLANT
SHEATH EVOLUTION SHORTE RL 11F (SHEATH) IMPLANT
SHEATH PINNACLE 6F 10CM (SHEATH) IMPLANT
TRAY PACEMAKER INSERTION (PACKS) IMPLANT
WIRE HI TORQ VERSACORE-J 145CM (WIRE) IMPLANT

## 2022-09-08 NOTE — H&P (View-Only) (Signed)
Patient Name: Kayla [name] Date of Encounter: 09/08/2022  Primary Cardiologist: Mark Skains, MD Electrophysiologist: Steven Klein, MD  Interval Summary   NAEO. No questions about procedure today. Willing to wave DNR for procedure.   Inpatient Medications    Scheduled Meds:  sodium chloride   Intravenous Once   amiodarone  200 mg Oral Daily   amLODipine  5 mg Oral Daily   atorvastatin  40 mg Oral Daily   vitamin D3  1,000 Units Oral Q lunch   cyanocobalamin  1,000 mcg Oral Q lunch   gabapentin  200 mg Oral TID   gentamicin (GARAMYCIN) 80 mg in sodium chloride 0.9 % 500 mL irrigation  80 mg Irrigation To SSTC   insulin aspart  0-15 Units Subcutaneous TID WC   insulin aspart  0-5 Units Subcutaneous QHS   insulin glargine-yfgn  20 Units Subcutaneous Daily   levothyroxine  200 mcg Oral QAC breakfast   metoprolol succinate  100 mg Oral Daily   mupirocin ointment  1 Application Nasal BID   Continuous Infusions:  sodium chloride     sodium chloride 50 mL/hr at 09/08/22 0552    ceFAZolin (ANCEF) IV     DAPTOmycin (CUBICIN) 600 mg in sodium chloride 0.9 % IVPB 600 mg (09/07/22 1659)   sodium chloride     PRN Meds: acetaminophen, albuterol, HYDROcodone-acetaminophen, HYDROmorphone (DILAUDID) injection, melatonin, polyethylene glycol, prochlorperazine   Vital Signs    Vitals:   09/07/22 1230 09/07/22 1935 09/08/22 0500 09/08/22 0747  BP: (!) 140/71 (!) 143/40  (!) 130/56  Pulse: 60 61  60  Resp: 18 15  18  Temp: 98.8 F (37.1 C) 98.3 F (36.8 C)  98.4 F (36.9 C)  TempSrc: Temporal   Oral  SpO2: 98% 90%  98%  Weight:   105.2 kg   Height:        Intake/Output Summary (Last 24 hours) at 09/08/2022 0841 Last data filed at 09/08/2022 0821 Gross per 24 hour  Intake 200.42 ml  Output --  Net 200.42 ml   Filed Weights   09/04/22 0451 09/08/22 0500  Weight: 100.5 kg 105.2 kg    Physical Exam    GEN- The patient is elderly and chronically ill appearing, alert and  oriented x 3 today.   Lungs- Clear to ausculation bilaterally, normal work of breathing Cardiac- Regular rate and rhythm, no murmurs, rubs or gallops GI- soft, NT, ND, + BS Extremities- no clubbing or cyanosis. No edema  Telemetry    A paced 60s (personally reviewed)  Hospital Course    Kayla [name] is a 82-year-old woman who I am seeing today for possible CIED associated bacteremia at the request of Dr. Dahal. [name], diabetic polyneuropathy, sick sinus syndrome post permanent pacemaker implant, hyperlipidemia, obesity, chronic venous insufficiency, COPD, gout, iron deficiency anemia, hypertension, paroxysmal atrial fibrillation on Eliquis. She was admitted with left great toe diabetic foot infection and has recently undergone amputation of her great toe. During the hospitalization blood cultures have returned positive (2 of 2) for MRSA.   Assessment & Plan    MRSA bacteremia CIED associated infection 2/2 + BCx for MRSA bacteremia TEE 5/6 with RV lead vegetation at the level of the RA  SSS/SND She is atrially dependent and will require temp-perm pacing and discussion of pacing strategy moving forward (? R sided re-implant vs leadless)  PAF NSR.  Eliquis on hold     Anemia Labs pending this am     Acute on chronic kidney disease BMET pending this am  Dr. Noraa Pickeral to see for extraction today.   For questions or updates, please contact [facility] Please consult [email or web address] for contact info under Cardiology/STEMI.  Signed, Michael Andrew Tillery, PA-C  09/08/2022, 8:41 AM   EP Attending  Patient seen and examined. Discussed in detail with the patient and her husband. She has PPM lead endocarditis with MRSA. I have reviewed the indications/risks/benefits/goals/expectations of PM system extraction and she is willing to proceed. We will place a temp perm after the case if no escape is available.   Kaio Kuhlman,MD  

## 2022-09-08 NOTE — Progress Notes (Addendum)
Patient Name: Kayla [name] Date of Encounter: 09/08/2022  Primary Cardiologist: Donato Schultz, MD Electrophysiologist: Sherryl Manges, MD  Interval Summary   NAEO. No questions about procedure today. Willing to wave DNR for procedure.   Inpatient Medications    Scheduled Meds:  sodium chloride   Intravenous Once   amiodarone  200 mg Oral Daily   amLODipine  5 mg Oral Daily   atorvastatin  40 mg Oral Daily   vitamin D3  1,000 Units Oral Q lunch   cyanocobalamin  1,000 mcg Oral Q lunch   gabapentin  200 mg Oral TID   gentamicin (GARAMYCIN) 80 mg in sodium chloride 0.9 % 500 mL irrigation  80 mg Irrigation To SSTC   insulin aspart  0-15 Units Subcutaneous TID WC   insulin aspart  0-5 Units Subcutaneous QHS   insulin glargine-yfgn  20 Units Subcutaneous Daily   levothyroxine  200 mcg Oral QAC breakfast   metoprolol succinate  100 mg Oral Daily   mupirocin ointment  1 Application Nasal BID   Continuous Infusions:  sodium chloride     sodium chloride 50 mL/hr at 09/08/22 0552    ceFAZolin (ANCEF) IV     DAPTOmycin (CUBICIN) 600 mg in sodium chloride 0.9 % IVPB 600 mg (09/07/22 1659)   sodium chloride     PRN Meds: acetaminophen, albuterol, HYDROcodone-acetaminophen, HYDROmorphone (DILAUDID) injection, melatonin, polyethylene glycol, prochlorperazine   Vital Signs    Vitals:   09/07/22 1230 09/07/22 1935 09/08/22 0500 09/08/22 0747  BP: (!) 140/71 (!) 143/40  (!) 130/56  Pulse: 60 61  60  Resp: 18 15  18   Temp: 98.8 F (37.1 C) 98.3 F (36.8 C)  98.4 F (36.9 C)  TempSrc: Temporal   Oral  SpO2: 98% 90%  98%  Weight:   105.2 kg   Height:        Intake/Output Summary (Last 24 hours) at 09/08/2022 0841 Last data filed at 09/08/2022 1610 Gross per 24 hour  Intake 200.42 ml  Output --  Net 200.42 ml   Filed Weights   09/04/22 0451 09/08/22 0500  Weight: 100.5 kg 105.2 kg    Physical Exam    GEN- The patient is elderly and chronically ill appearing, alert and  oriented x 3 today.   Lungs- Clear to ausculation bilaterally, normal work of breathing Cardiac- Regular rate and rhythm, no murmurs, rubs or gallops GI- soft, NT, ND, + BS Extremities- no clubbing or cyanosis. No edema  Telemetry    A paced 60s (personally reviewed)  Hospital Course    Kayla [name] is a 82 year old woman who I am seeing today for possible CIED associated bacteremia at the request of Dr. Pola Corn. [name], diabetic polyneuropathy, sick sinus syndrome post permanent pacemaker implant, hyperlipidemia, obesity, chronic venous insufficiency, COPD, gout, iron deficiency anemia, hypertension, paroxysmal atrial fibrillation on Eliquis. She was admitted with left great toe diabetic foot infection and has recently undergone amputation of her great toe. During the hospitalization blood cultures have returned positive (2 of 2) for MRSA.   Assessment & Plan    MRSA bacteremia CIED associated infection 2/2 + BCx for MRSA bacteremia TEE 5/6 with RV lead vegetation at the level of the RA  SSS/SND She is atrially dependent and will require temp-perm pacing and discussion of pacing strategy moving forward (? R sided re-implant vs leadless)  PAF NSR.  Eliquis on hold     Anemia Labs pending this am  Acute on chronic kidney disease BMET pending this am  Dr. Ladona Ridgel to see for extraction today.   For questions or updates, please contact [facility] Please consult [email or web address] for contact info under Cardiology/STEMI.  Signed, Graciella Freer, PA-C  09/08/2022, 8:41 AM   EP Attending  Patient seen and examined. Discussed in detail with the patient and her husband. She has PPM lead endocarditis with MRSA. I have reviewed the indications/risks/benefits/goals/expectations of PM system extraction and she is willing to proceed. We will place a temp perm after the case if no escape is available.   Sharlot Gowda Waldon Sheerin,MD

## 2022-09-08 NOTE — Anesthesia Preprocedure Evaluation (Addendum)
Anesthesia Evaluation  Patient identified by MRN, date of birth, ID band Patient awake    Reviewed: Allergy & Precautions, H&P , NPO status , Patient's Chart, lab work & pertinent test results  History of Anesthesia Complications (+) PONV and history of anesthetic complications  Airway Mallampati: II   Neck ROM: full    Dental   Pulmonary shortness of breath and at rest, sleep apnea , COPD, former smoker  Pt with increased work of breathing and RR 20s/min   + decreased breath sounds      Cardiovascular hypertension, + CAD and +CHF  + dysrhythmias Atrial Fibrillation + pacemaker  Rhythm:regular Rate:Normal  TEE (09/07/22): vegetation on RV pacemaker lead. Normal EF.   Neuro/Psych  Headaches  Anxiety      Neuromuscular disease    GI/Hepatic ,GERD  ,,  Endo/Other  diabetes, Type 2Hypothyroidism    Renal/GU Renal InsufficiencyRenal disease     Musculoskeletal  (+) Arthritis ,    Abdominal   Peds  Hematology  (+) Blood dyscrasia, anemia   Anesthesia Other Findings   Reproductive/Obstetrics                             Anesthesia Physical Anesthesia Plan  ASA: 3  Anesthesia Plan: General   Post-op Pain Management:    Induction: Intravenous  PONV Risk Score and Plan: 4 or greater and Ondansetron, Dexamethasone and Treatment may vary due to age or medical condition  Airway Management Planned: Oral ETT  Additional Equipment: TEE  Intra-op Plan:   Post-operative Plan: Extubation in OR  Informed Consent: I have reviewed the patients History and Physical, chart, labs and discussed the procedure including the risks, benefits and alternatives for the proposed anesthesia with the patient or authorized representative who has indicated his/her understanding and acceptance.   Patient has DNR.  Suspend DNR.   Dental advisory given  Plan Discussed with: CRNA, Anesthesiologist and  Surgeon  Anesthesia Plan Comments:        Anesthesia Quick Evaluation

## 2022-09-08 NOTE — Progress Notes (Signed)
    20 g, L arterial line was manually removed and pressure was held for 10 min.No hematoma or bleeding was noted post sheath pull. Sterile gauze was applied at the site. Capillary refill was > 3 sec.

## 2022-09-08 NOTE — Transfer of Care (Signed)
Immediate Anesthesia Transfer of Care Note  Patient: [name]  Procedure(s) Performed: LEAD EXTRACTION  Patient Location: Cath Lab  Anesthesia Type:General  Level of Consciousness: awake and alert   Airway & Oxygen Therapy: Patient Spontanous Breathing and Patient connected to face mask oxygen  Post-op Assessment: Report given to RN and Post -op Vital signs reviewed and stable  Post vital signs: Reviewed and stable  Last Vitals:  Vitals Value Taken Time  BP 145/53 09/08/22 1635  Temp    Pulse 56 09/08/22 1638  Resp 22 09/08/22 1642  SpO2 92 % 09/08/22 1638  Vitals shown include unvalidated device data.  Last Pain:  Vitals:   09/08/22 1002  TempSrc: Oral  PainSc: 2       Patients Stated Pain Goal: 3 (09/07/22 1621)  Complications: There were no known notable events for this encounter.

## 2022-09-08 NOTE — Anesthesia Procedure Notes (Signed)
Arterial Line Insertion Start/End5/11/2022 11:35 AM, 09/08/2022 11:35 AM Performed by: Dorie Rank, CRNA, CRNA  Preanesthetic checklist: patient identified, IV checked, site marked, risks and benefits discussed, surgical consent, monitors and equipment checked, pre-op evaluation and timeout performed Lidocaine 1% used for infiltration Left was placed Catheter size: 20 G Hand hygiene performed , maximum sterile barriers used  and Seldinger technique used Allen's test indicative of satisfactory collateral circulation Attempts: 1 Procedure performed without using ultrasound guided technique. Ultrasound Notes:anatomy identified Following insertion, Biopatch and dressing applied. Post procedure assessment: normal  Patient tolerated the procedure well with no immediate complications.

## 2022-09-08 NOTE — Anesthesia Procedure Notes (Signed)
Procedure Name: Intubation Date/Time: 09/08/2022 2:02 PM  Performed by: Dorie Rank, CRNAPre-anesthesia Checklist: Patient identified, Emergency Drugs available, Suction available, Timeout performed and Patient being monitored Patient Re-evaluated:Patient Re-evaluated prior to induction Oxygen Delivery Method: Circle system utilized Preoxygenation: Pre-oxygenation with 100% oxygen Induction Type: IV induction Ventilation: Mask ventilation without difficulty Laryngoscope Size: Mac and 3 Grade View: Grade I Tube type: Oral Tube size: 7.0 mm Number of attempts: 1 Airway Equipment and Method: Stylet Placement Confirmation: ETT inserted through vocal cords under direct vision, positive ETCO2 and breath sounds checked- equal and bilateral Secured at: 22 cm Tube secured with: Tape Dental Injury: Teeth and Oropharynx as per pre-operative assessment

## 2022-09-08 NOTE — Progress Notes (Signed)
PROGRESS NOTE  Kayla [name]  DOB: 1940-12-14  PCP: Ailene Ravel, MD ZOX:[id number]  DOA: 09/03/2022  LOS: 5 days  Hospital Day: 6  Brief narrative: Kayla [name] is a 82 y.o. female with PMH significant for obesity, OSA, DM2, HTN, HLD, CAD, CHF, paroxysmal A-fib on Eliquis, SSS s/p PPM, carotid artery disease, CKD, chronic anemia, COPD, anxiety/depression, GERD, hypothyroidism, diabetic polyneuropathy, chronic venous insufficiency, h/o left toe amputation 5/2, patient was sent to the ED by her podiatrist Dr. Annamary Rummage with concern of left great toe diabetic foot infection possible osteomyelitis seen on x-ray.  In the ED, patient is afebrile, blood pressure 121/48, breathing on 3 L oxygen by nasal cannula Initial labs with WC count 10.7, hemoglobin 9.1, lactic acid 1.1, creatinine 2.85, glucose 250 MRI left foot confirmed osteomyelitis. Started on broad-spectrum IV antibiotics Admitted to [facility]. 5/3, s/p left partial first ray amputation.  Subjective: Patient was seen and examined this morning.  Lying down in bed. Was waiting for pacemaker lead extraction.  Assessment and plan: Left diabetic foot infection  Left great toe wound infection with concern for osteomyelitis, POA S/p left partial first ray amputation -5/3 Dr. Ardelle Anton Seen the vascular surgery but revascularization was not needed. Procedure as above.  MRSA bacteremia Vegetation in pacemaker lead Blood culture drawn on admission is growing MRSA in both sets. Currently on IV antibiotic coverage with IV Rocephin, IV Flagyl, IV vancomycin TEE 5/6 shows a vegetation on RV lead Cardiology plans for lead extraction today  Acute on chronic anemia Iron deficiency anemia Baseline hemoglobin above 9.   Hemoglobin was low at 7.7 on 5/4.  Monitor PRBC transfusion was given with improvement in hemoglobin to 8.2.  Continue to monitor.  Repeat hemoglobin this am was at 8. Continue to monitor. Recent Labs    09/03/22 1747  09/04/22 0129 09/05/22 0148 09/06/22 0137 09/08/22 0819  HGB 9.1* 8.6* 7.7* 8.2* 8.0*  MCV 98.3 96.7 97.6 96.7 96.3   AKI on CKD 4 suspect prerenal in the setting of poor oral intake. Baseline creatinine less than 2. Presented with creatinine of 2.85, worsened to peak at 3.31, gradually improving 1.99. Off antibiotics.  Continue to monitor. Recent Labs    01/24/22 0318 01/25/22 0325 02/05/22 1037 02/16/22 1008 09/03/22 1747 09/04/22 0129 09/05/22 0148 09/06/22 0137 09/07/22 0127 09/08/22 0819  BUN 62* 65* 45* 47* 54* 60* 57* 53* 43* 40*  CREATININE 2.12* 1.89* 1.86* 2.09* 2.85* 2.94* 3.31* 2.69* 2.14* 1.99*   Chronic diastolic CHF Essential hypertension PTA Toprol, amlodipine, torsemide, losartan Currently on Toprol and amlodipine.  Others on hold.  Continue to monitor.  Paroxysmal A-fib  H/o SSS s/p PPM PTA on Toprol, amiodarone, Eliquis.   Continue Toprol and amiodarone. Eliquis on hold because of anemia and pending plan of pacemaker lead extraction.  CAD/HLD Continue statin.  Anticoagulation plan as above.  Type 2 diabetes with hyperglycemia Diabetic polyneuropathy A1c 6.8 on 09/04/2022 Semglee increased to 20 units this am. Continue sliding scale insulin with Accu-Cheks. Continue gabapentin Recent Labs  Lab 09/07/22 1311 09/07/22 1603 09/07/22 1935 09/08/22 0746 09/08/22 1040  GLUCAP 217* 263* 293* 252* 205*   Hypothyroidism Synthroid  Obesity  Body mass index is 38.59 kg/m. Patient has been advised to make an attempt to improve diet and exercise patterns to aid in weight loss.   Mobility: Needs PT eval postprocedure  Goals of care   Code Status: DNR    DVT prophylaxis: Heparin [place]  Antimicrobials: IV Vancomycin per ID Fluid:  None Consultants: Podiatry, vascular surgery, ID, cardiology Family Communication: Husband at bedside  Status: Inpatient Level of care:  Telemetry Surgical   Patient from: Home Anticipated d/c to: Pending clinical  course Needs to continue in-hospital care:  MRSA bacteremia.  Lead extraction today   Diet:  Diet Order             Diet NPO time specified  Diet effective now                   Scheduled Meds:  [MAR Hold] sodium chloride   Intravenous Once   sodium chloride   Intravenous Once   [MAR Hold] amiodarone  200 mg Oral Daily   [MAR Hold] amLODipine  5 mg Oral Daily   [MAR Hold] atorvastatin  40 mg Oral Daily   [MAR Hold] vitamin D3  1,000 Units Oral Q lunch   [MAR Hold] cyanocobalamin  1,000 mcg Oral Q lunch   [MAR Hold] gabapentin  200 mg Oral TID   gentamicin (GARAMYCIN) 80 mg in sodium chloride 0.9 % 500 mL irrigation  80 mg Irrigation To SSTC   insulin aspart       [MAR Hold] insulin aspart  0-15 Units Subcutaneous TID WC   [MAR Hold] insulin aspart  0-5 Units Subcutaneous QHS   [MAR Hold] insulin glargine-yfgn  20 Units Subcutaneous Daily   [MAR Hold] levothyroxine  200 mcg Oral QAC breakfast   [MAR Hold] metoprolol succinate  100 mg Oral Daily   [MAR Hold] mupirocin ointment  1 Application Nasal BID   sodium chloride 0.9 % with gentamicin (GARAMYCIN) ADS Med        PRN meds: [MAR Hold] acetaminophen, [MAR Hold] albuterol, ceFAZolin, Heparin (Porcine) in NaCl, [MAR Hold] HYDROcodone-acetaminophen, [MAR Hold]  HYDROmorphone (DILAUDID) injection, insulin aspart, insulin aspart, [MAR Hold] melatonin, [MAR Hold] polyethylene glycol, [MAR Hold] prochlorperazine, sodium chloride 0.9 % with gentamicin (GARAMYCIN) ADS Med   Infusions:   sodium chloride 50 mL/hr at 09/08/22 0844   sodium chloride 50 mL/hr at 09/08/22 0552   sodium chloride 10 mL/hr at 09/08/22 1010   ceFAZolin      ceFAZolin (ANCEF) IV     [MAR Hold] DAPTOmycin (CUBICIN) 600 mg in sodium chloride 0.9 % IVPB 600 mg (09/07/22 1659)   [MAR Hold] sodium chloride      Antimicrobials: Anti-infectives (From admission, onward)    Start     Dose/Rate Route Frequency Ordered Stop   09/08/22 1308  ceFAZolin (ANCEF)  2-4 GM/100ML-% IVPB       Note to Pharmacy: Pollie Meyer A: cabinet override      09/08/22 1308 09/09/22 0114   09/08/22 1308  sodium chloride 0.9 % with gentamicin (GARAMYCIN) ADS Med       Note to Pharmacy: Pollie Meyer A: cabinet override      09/08/22 1308 09/09/22 0114   09/08/22 0956  ceFAZolin (ANCEF) 2-4 GM/100ML-% IVPB  Status:  Discontinued       Note to Pharmacy: [facility] [place]: cabinet override      09/08/22 0956 09/08/22 1002   09/08/22 0700  gentamicin (GARAMYCIN) 80 mg in sodium chloride 0.9 % 500 mL irrigation        80 mg Irrigation To Short Stay 09/08/22 0654 09/09/22 0700   09/08/22 0700  ceFAZolin (ANCEF) IVPB 2g/100 mL premix        2 g 200 mL/hr over 30 Minutes Intravenous To Short Stay 09/08/22 0654 09/09/22 0700   09/07/22 1600  [MAR Hold]  DAPTOmycin (CUBICIN) 600 mg in sodium chloride 0.9 % IVPB        (MAR Hold since Tue 09/08/2022 at 0949.Hold Reason: Transfer to a Procedural area)   8 mg/kg  74.4 kg (Adjusted) 124 mL/hr over 30 Minutes Intravenous Every 48 hours 09/07/22 1317     09/07/22 1000  vancomycin (VANCOREADY) IVPB 1500 mg/300 mL  Status:  Discontinued        1,500 mg 150 mL/hr over 120 Minutes Intravenous every 72 hours 09/04/22 0105 09/05/22 1019   09/06/22 1200  vancomycin (VANCOREADY) IVPB 750 mg/150 mL  Status:  Discontinued        750 mg 150 mL/hr over 60 Minutes Intravenous Every 48 hours 09/05/22 1019 09/07/22 1317   09/04/22 2200  metroNIDAZOLE (FLAGYL) IVPB 500 mg  Status:  Discontinued       See Hyperspace for full Linked Orders Report.   500 mg 100 mL/hr over 60 Minutes Intravenous Every 12 hours 09/04/22 1022 09/07/22 0830   09/04/22 1100  cefTRIAXone (ROCEPHIN) 2 g in sodium chloride 0.9 % 100 mL IVPB  Status:  Discontinued       See Hyperspace for full Linked Orders Report.   2 g 200 mL/hr over 30 Minutes Intravenous Every 24 hours 09/04/22 1022 09/07/22 0830   09/04/22 0200  vancomycin (VANCOREADY) IVPB 2000 mg/400 mL         2,000 mg 200 mL/hr over 120 Minutes Intravenous  Once 09/04/22 0105 09/04/22 0626   09/03/22 2230  cefTRIAXone (ROCEPHIN) 2 g in sodium chloride 0.9 % 100 mL IVPB  Status:  Discontinued       See Hyperspace for full Linked Orders Report.   2 g 200 mL/hr over 30 Minutes Intravenous Every 24 hours 09/03/22 2218 09/04/22 1022   09/03/22 2230  metroNIDAZOLE (FLAGYL) IVPB 500 mg  Status:  Discontinued       See Hyperspace for full Linked Orders Report.   500 mg 100 mL/hr over 60 Minutes Intravenous Every 12 hours 09/03/22 2218 09/04/22 1022       Nutritional status:  Body mass index is 38.59 kg/m.          Objective: Vitals:   09/08/22 1200 09/08/22 1202  BP:    Pulse: 60 60  Resp: 20 (!) 22  Temp:    SpO2: 97% 97%    Intake/Output Summary (Last 24 hours) at 09/08/2022 1424 Last data filed at 09/08/2022 1407 Gross per 24 hour  Intake 150.42 ml  Output --  Net 150.42 ml   Filed Weights   09/04/22 0451 09/08/22 0500  Weight: 100.5 kg 105.2 kg   Weight change:  Body mass index is 38.59 kg/m.   Physical Exam: General exam: Pleasant, elderly Caucasian female. Not in distress. Skin: No rashes, lesions or ulcers. HEENT: Atraumatic, normocephalic, no obvious bleeding Lungs: Clear to auscultation bilaterally CVS: Regular rate and rhythm, no murmur.  Pacemaker site nontender. GI/Abd soft, nontender, nondistended, bowel present CNS: Alert, awake, oriented x 3 Psychiatry: Mood appropriate Extremities: No pedal edema, no calf tenderness.  Status post left great toe amputation   Data Review: I have personally reviewed the laboratory data and studies available.  F/u labs ordered Unresulted Labs (From admission, onward)     Start     Ordered   09/08/22 0500  CK  Weekly,   R      09/07/22 1317   09/08/22 0425  Surgical PCR screen  (Screening)  Once,   R  09/08/22 0424   09/06/22 0944  Miscellaneous [facility] test (send-out)  Add-on,   AD       Comments: dapto sens on  blood cx 5/2   Question:  Test name / description:  Answer:  [id number]   09/06/22 0944            Total time spent in review of labs and imaging, patient evaluation, formulation of plan, documentation and communication with family: 45 minutes  Signed, Lorin Glass, MD [facility] 09/08/2022

## 2022-09-08 NOTE — Anesthesia Postprocedure Evaluation (Signed)
Anesthesia Post Note  Patient: Kayla [name]  Procedure(s) Performed: TRANSESOPHAGEAL ECHOCARDIOGRAM     Patient location during evaluation: PACU Anesthesia Type: MAC Level of consciousness: awake and alert Pain management: pain level controlled Vital Signs Assessment: post-procedure vital signs reviewed and stable Respiratory status: spontaneous breathing, nonlabored ventilation, respiratory function stable and patient connected to nasal cannula oxygen Cardiovascular status: stable and blood pressure returned to baseline Postop Assessment: no apparent nausea or vomiting Anesthetic complications: no  No notable events documented.  Last Vitals:  Vitals:   09/07/22 1230 09/07/22 1935  BP: (!) 140/71 (!) 143/40  Pulse: 60 61  Resp: 18 15  Temp: 37.1 C 36.8 C  SpO2: 98% 90%    Last Pain:  Vitals:   09/08/22 0636  TempSrc:   PainSc: Asleep                 Ambers Iyengar S

## 2022-09-08 NOTE — Interval H&P Note (Signed)
History and Physical Interval Note:  09/08/2022 10:29 AM  Kayla [name]  has presented today for surgery, with the diagnosis of lead malfunction.  The various methods of treatment have been discussed with the patient and family. After consideration of risks, benefits and other options for treatment, the patient has consented to  Procedure(s): LEAD EXTRACTION (N/A) with insertion of a temp/perm PPM as a surgical intervention.  The patient's history has been reviewed, patient examined, no change in status, stable for surgery.  I have reviewed the patient's chart and labs.  Questions were answered to the patient's satisfaction.     Lewayne Bunting

## 2022-09-08 NOTE — Inpatient Diabetes Management (Signed)
Inpatient Diabetes Program Recommendations  AACE/ADA: New Consensus Statement on Inpatient Glycemic Control (2015)  Target Ranges:  Prepandial:   less than 140 mg/dL      Peak postprandial:   less than 180 mg/dL (1-2 hours)      Critically ill patients:  140 - 180 mg/dL   Lab Results  Component Value Date   GLUCAP 205 (H) 09/08/2022   HGBA1C 6.8 (H) 09/04/2022    Review of Glycemic Control  Latest Reference Range & Units 09/07/22 07:25 09/07/22 12:29 09/07/22 13:11 09/07/22 16:03 09/07/22 19:35 09/08/22 07:46 09/08/22 10:40  Glucose-Capillary 70 - 99 mg/dL 161 (H) 096 (H) 045 (H) 263 (H) 293 (H) 252 (H) 205 (H)   Diabetes history: DM 2 Outpatient Diabetes medications: Lantus 55 units daily, Novolog 10-17 units SS as needed  Current orders for Inpatient glycemic control:  Semglee 20 units Daily Novolog 0-15 units tid + hs  Inpatient Diabetes Program Recommendations:    -   consider increasing Semglee to 28 units  Thanks, Christena Deem RN, MSN, BC-ADM Inpatient Diabetes Coordinator Team Pager [phone] (8a-5p)

## 2022-09-08 NOTE — Progress Notes (Signed)
[facility]  Date of Admission:  09/03/2022   Total days of inpatient antibiotics 5  Principal Problem:   Diabetic foot infection (HCC) Active Problems:   Acute hematogenous osteomyelitis of left foot (HCC)   Pyogenic inflammation of bone (HCC)   Closed nondisplaced fracture of proximal phalanx of right great toe          Assessment: 82 year old female previous history of recurrent septic prepatellar bursitis status post washout in March 2023 or cultures grew MRSA treated with daptomycin x 6 weeks admitted with left great toe foot infection:   #MRSA bacteremia 2/2 CIED likely seeded by foot #CIED infection - Blood cultures on 5/2 grew 2/2 MRSA  - PPM site is tender on palpation   #DFU is post first ray amputation on 5/3 noted that was no purulence or proximal tracking. -Sent o [facility] ED at request of podiatry to to c/f possible osteo on xrya.  - MRI left foot without contrast on 5/3 showed tissue loss around great toe with sinus tract stenting to proximal phalanx with 1.3 X1.3X 1.2 cm soft tissue abscess.  Concerning for superimposed osteomyelitis.  Metallic  body body and fifth and fourth metatarsal head. -Taken to the OR for left partial first ray amputation, podiatry Dr. Ardelle Anton.Pt states she was on doxy x10 days prior to hospitalization(seen by Dr. Allena Katz -Podiatry on 4/18) outpatient.  -TEE showed RV lead veg. EP engaged Recommendations: - Continue vancomycin - Follow-up repeat blood cultures to ensure clearance - Follow-up path from 5/3 -  OR Cx from 5/3 NG -  PPM extraction planned for today, likely 6 weeks of abx from extraction.    #Diabetes mellitus - A1c 6.8 on 09/04/2022   #PAF on Eliquis - Management per primary. Eliquis on hold   #CKD stage IV - Vancomycin is being dosed with pharmacy     Microbiology:   Antibiotics: Vancomycin, ceftriaxone, metronidazole   Cultures: Blood 5/22/2 MRSA   SUBJECTIVE: Resitng in bed.  Husband at bedside Interval: Afebrile overnight  Review of Systems: Review of Systems  All other systems reviewed and are negative.    Scheduled Meds:  [MAR Hold] sodium chloride   Intravenous Once   sodium chloride   Intravenous Once   [MAR Hold] amiodarone  200 mg Oral Daily   [MAR Hold] amLODipine  5 mg Oral Daily   [MAR Hold] atorvastatin  40 mg Oral Daily   [MAR Hold] vitamin D3  1,000 Units Oral Q lunch   [MAR Hold] cyanocobalamin  1,000 mcg Oral Q lunch   [MAR Hold] gabapentin  200 mg Oral TID   gentamicin (GARAMYCIN) 80 mg in sodium chloride 0.9 % 500 mL irrigation  80 mg Irrigation To SSTC   insulin aspart       [MAR Hold] insulin aspart  0-15 Units Subcutaneous TID WC   [MAR Hold] insulin aspart  0-5 Units Subcutaneous QHS   [MAR Hold] insulin glargine-yfgn  20 Units Subcutaneous Daily   [MAR Hold] levothyroxine  200 mcg Oral QAC breakfast   [MAR Hold] metoprolol succinate  100 mg Oral Daily   [MAR Hold] mupirocin ointment  1 Application Nasal BID   Continuous Infusions:  sodium chloride 50 mL/hr at 09/08/22 0844   sodium chloride 50 mL/hr at 09/08/22 0552   sodium chloride 10 mL/hr at 09/08/22 1010    ceFAZolin (ANCEF) IV     [MAR Hold] DAPTOmycin (CUBICIN) 600 mg in sodium chloride 0.9 %  IVPB 600 mg (09/07/22 1659)   [MAR Hold] sodium chloride     PRN Meds:.[MAR Hold] acetaminophen, [MAR Hold] albuterol, [MAR Hold] HYDROcodone-acetaminophen, [MAR Hold]  HYDROmorphone (DILAUDID) injection, insulin aspart, insulin aspart, [MAR Hold] melatonin, [MAR Hold] polyethylene glycol, [MAR Hold] prochlorperazine Allergies  Allergen Reactions   Dulaglutide Nausea Only, Other (See Comments) and Cough    TRULICITY made the patient sick to her stomach (only) several days after using it   Oxycodone Itching and Other (See Comments)    Pt still takes this med even thought it causes itching   Trelegy Ellipta [Fluticasone-Umeclidin-Vilant] Nausea And Vomiting, Other (See Comments)  and Cough    Made the patient sick to her stomach (only) several days after using it     OBJECTIVE: Vitals:   09/08/22 0747 09/08/22 1002 09/08/22 1030 09/08/22 1100  BP: (!) 130/56 (!) 168/49 (!) 169/57   Pulse: 60 62 60 60  Resp: 18 17 (!) 22 (!) 22  Temp: 98.4 F (36.9 C) 98.6 F (37 C)    TempSrc: Oral Oral    SpO2: 98% 97% 96% 95%  Weight:      Height:       Body mass index is 38.59 kg/m.  Physical Exam Constitutional:      Appearance: Normal appearance.  HENT:     Head: Normocephalic and atraumatic.     Right Ear: Tympanic membrane normal.     Left Ear: Tympanic membrane normal.     Nose: Nose normal.     Mouth/Throat:     Mouth: Mucous membranes are moist.  Eyes:     Extraocular Movements: Extraocular movements intact.     Conjunctiva/sclera: Conjunctivae normal.     Pupils: Pupils are equal, round, and reactive to light.  Cardiovascular:     Rate and Rhythm: Normal rate and regular rhythm.     Heart sounds: No murmur heard.    No friction rub. No gallop.  Pulmonary:     Effort: Pulmonary effort is normal.     Breath sounds: Normal breath sounds.  Abdominal:     General: Abdomen is flat.     Palpations: Abdomen is soft.  Musculoskeletal:     Comments: Foot bandaged  Skin:    General: Skin is warm and dry.  Neurological:     General: No focal deficit present.     Mental Status: She is alert and oriented to person, place, and time.  Psychiatric:        Mood and Affect: Mood normal.       Lab Results Lab Results  Component Value Date   WBC 11.9 (H) 09/08/2022   HGB 8.0 (L) 09/08/2022   HCT 25.9 (L) 09/08/2022   MCV 96.3 09/08/2022   PLT 471 (H) 09/08/2022    Lab Results  Component Value Date   CREATININE 1.99 (H) 09/08/2022   BUN 40 (H) 09/08/2022   NA 132 (L) 09/08/2022   K 4.1 09/08/2022   CL 98 09/08/2022   CO2 24 09/08/2022    Lab Results  Component Value Date   ALT 14 09/03/2022   AST 16 09/03/2022   ALKPHOS 64 09/03/2022    BILITOT 0.3 09/03/2022        Danelle Earthly, MD [facility] 09/08/2022, 11:38 AM

## 2022-09-09 ENCOUNTER — Other Ambulatory Visit: Payer: Self-pay

## 2022-09-09 DIAGNOSIS — E11628 Type 2 diabetes mellitus with other skin complications: Secondary | ICD-10-CM | POA: Diagnosis not present

## 2022-09-09 DIAGNOSIS — Z794 Long term (current) use of insulin: Secondary | ICD-10-CM | POA: Diagnosis not present

## 2022-09-09 DIAGNOSIS — L089 Local infection of the skin and subcutaneous tissue, unspecified: Secondary | ICD-10-CM | POA: Diagnosis not present

## 2022-09-09 DIAGNOSIS — B9562 Methicillin resistant Staphylococcus aureus infection as the cause of diseases classified elsewhere: Secondary | ICD-10-CM | POA: Diagnosis not present

## 2022-09-09 LAB — CULTURE, BLOOD (ROUTINE X 2): Culture: NO GROWTH

## 2022-09-09 LAB — TYPE AND SCREEN
ABO/RH(D): B POS
Unit division: 0
Unit division: 0
Unit division: 0
Unit division: 0

## 2022-09-09 LAB — GLUCOSE, CAPILLARY
Glucose-Capillary: 195 mg/dL — ABNORMAL HIGH (ref 70–99)
Glucose-Capillary: 294 mg/dL — ABNORMAL HIGH (ref 70–99)
Glucose-Capillary: 282 mg/dL — ABNORMAL HIGH (ref 70–99)
Glucose-Capillary: 258 mg/dL — ABNORMAL HIGH (ref 70–99)

## 2022-09-09 LAB — CBC
HCT: 26.5 % — ABNORMAL LOW (ref 36.0–46.0)
Hemoglobin: 8.5 g/dL — ABNORMAL LOW (ref 12.0–15.0)
MCH: 30.8 pg (ref 26.0–34.0)
MCHC: 32.1 g/dL (ref 30.0–36.0)
MCV: 96 fL (ref 80.0–100.0)
Platelets: 389 10*3/uL (ref 150–400)
RBC: 2.76 MIL/uL — ABNORMAL LOW (ref 3.87–5.11)
RDW: 15.1 % (ref 11.5–15.5)
WBC: 13.6 10*3/uL — ABNORMAL HIGH (ref 4.0–10.5)
nRBC: 0 % (ref 0.0–0.2)

## 2022-09-09 LAB — BASIC METABOLIC PANEL
Anion gap: 10 (ref 5–15)
BUN: 42 mg/dL — ABNORMAL HIGH (ref 8–23)
CO2: 24 mmol/L (ref 22–32)
Calcium: 8.5 mg/dL — ABNORMAL LOW (ref 8.9–10.3)
Chloride: 99 mmol/L (ref 98–111)
Creatinine, Ser: 2.58 mg/dL — ABNORMAL HIGH (ref 0.44–1.00)
GFR, Estimated: 18 mL/min — ABNORMAL LOW (ref >60)
Glucose, Bld: 199 mg/dL — ABNORMAL HIGH (ref 70–99)
Potassium: 4.7 mmol/L (ref 3.5–5.1)
Sodium: 133 mmol/L — ABNORMAL LOW (ref 135–145)

## 2022-09-09 LAB — BPAM RBC
Blood Product Expiration Date: 202406032359
Blood Product Expiration Date: 202406032359
Blood Product Expiration Date: 202406032359
Blood Product Expiration Date: 202406032359
ISSUE DATE / TIME: 202405072223
ISSUE DATE / TIME: 202405080839
ISSUE DATE / TIME: 202405080900
Unit Type and Rh: 7300
Unit Type and Rh: 7300
Unit Type and Rh: 7300

## 2022-09-09 LAB — MAGNESIUM: Magnesium: 2 mg/dL (ref 1.7–2.4)

## 2022-09-09 MED ORDER — GUAIFENESIN ER 600 MG PO TB12
600.0000 mg | ORAL_TABLET | Freq: Two times a day (BID) | ORAL | Status: DC
  Administered 2022-09-09 – 2022-09-15 (×13): 600 mg via ORAL
  Filled 2022-09-09 (×13): qty 1

## 2022-09-09 MED ORDER — INSULIN GLARGINE-YFGN 100 UNIT/ML ~~LOC~~ SOLN
25.0000 [IU] | Freq: Every day | SUBCUTANEOUS | Status: DC
  Administered 2022-09-09 – 2022-09-10 (×2): 25 [IU] via SUBCUTANEOUS
  Filled 2022-09-09 (×2): qty 0.25

## 2022-09-09 MED ORDER — METOPROLOL TARTRATE 25 MG PO TABS: 25.0000 mg | ORAL_TABLET | Freq: Two times a day (BID) | ORAL | Status: DC

## 2022-09-09 MED ORDER — GUAIFENESIN-DM 100-10 MG/5ML PO SYRP
5.0000 mL | ORAL_SOLUTION | ORAL | Status: DC | PRN
  Administered 2022-09-09 – 2022-09-15 (×17): 5 mL via ORAL
  Filled 2022-09-09 (×17): qty 5

## 2022-09-09 MED ORDER — SODIUM CHLORIDE 0.9 % IV SOLN: INTRAVENOUS | Status: DC

## 2022-09-09 MED ORDER — BUDESON-GLYCOPYRROL-FORMOTEROL 160-9-4.8 MCG/ACT IN AERO: 2.0000 | INHALATION_SPRAY | Freq: Two times a day (BID) | RESPIRATORY_TRACT | Status: DC

## 2022-09-09 MED ORDER — PHENOL 1.4 % MT LIQD
1.0000 | OROMUCOSAL | Status: DC | PRN
  Administered 2022-09-09 (×2): 1 via OROMUCOSAL
  Filled 2022-09-09: qty 177

## 2022-09-09 MED FILL — Fentanyl Citrate Preservative Free (PF) Inj 100 MCG/2ML: INTRAMUSCULAR | Qty: 1 | Status: AC

## 2022-09-09 MED FILL — Vasopressin IV Soln 20 Unit/ML (For IV Infusion): INTRAVENOUS | Qty: 1 | Status: AC

## 2022-09-09 NOTE — Progress Notes (Addendum)
PROGRESS NOTE  Kayla [name]  DOB: April 14, 1941  PCP: Ailene Ravel, MD WUJ:[id number]  DOA: 09/03/2022  LOS: 6 days  Hospital Day: 7  Brief narrative: Kayla [name] is a 82 y.o. female with PMH significant for obesity, OSA, DM2, HTN, HLD, CAD, CHF, paroxysmal A-fib on Eliquis, SSS s/p PPM, carotid artery disease, CKD, chronic anemia, COPD, anxiety/depression, GERD, hypothyroidism, diabetic polyneuropathy, chronic venous insufficiency, h/o left toe amputation 5/2, patient was sent to the ED by her podiatrist Dr. Annamary Rummage with concern of left great toe diabetic foot infection possible osteomyelitis seen on x-ray.  In the ED, patient is afebrile, blood pressure 121/48, breathing on 3 L oxygen by nasal cannula Initial labs with WC count 10.7, hemoglobin 9.1, lactic acid 1.1, creatinine 2.85, glucose 250 MRI left foot confirmed osteomyelitis. Started on broad-spectrum IV antibiotics Admitted to [facility]. 5/3, s/p left partial first ray amputation.  Blood culture on admission as well as wound culture grew MRSA.  She needed pacemaker lead extraction on 5/7. Currently on IV antibiotics per ID  Subjective: Patient was seen and examined this morning.  Lying on bed.  Wheezing, coughing.  Looks clinically dry though.  Creatinine worsening Bradycardic overnight.  Assessment and plan: Left diabetic foot infection  Left great toe wound infection with concern for osteomyelitis, POA S/p left partial first ray amputation -5/3 Dr. Ardelle Anton Seen the vascular surgery but revascularization was not needed. Procedure as above.  MRSA bacteremia Vegetation in pacemaker lead TEE 5/6 shows a vegetation on RV lead.  Underwent lead extraction on 5-7 Currently on IV antibiotics per ID  Acute on chronic anemia Iron deficiency anemia Baseline hemoglobin above 9.   Hemoglobin was low at 7.7 on 5/4.  Received 1 unit of PRBC.  Hemoglobin stable over 8. Recent Labs    09/04/22 0129 09/05/22 0148  09/06/22 0137 09/08/22 0819 09/09/22 0557  HGB 8.6* 7.7* 8.2* 8.0* 8.5*  MCV 96.7 97.6 96.7 96.3 96.0    AKI on CKD 4 suspect prerenal in the setting of poor oral intake. Baseline creatinine less than 2.  Trend as below.  Worsening last 24 hours.  Probably because of poor oral intake.  Looks dry clinically.  Started on normal saline at 75 mill per hour. Recent Labs    01/25/22 0325 02/05/22 1037 02/16/22 1008 09/03/22 1747 09/04/22 0129 09/05/22 0148 09/06/22 0137 09/07/22 0127 09/08/22 0819 09/09/22 0557  BUN 65* 45* 47* 54* 60* 57* 53* 43* 40* 42*  CREATININE 1.89* 1.86* 2.09* 2.85* 2.94* 3.31* 2.69* 2.14* 1.99* 2.58*    Acute on chronic respiratory failure with hypoxia On 3 L oxygen at home.  Currently requiring more than that.  Wheezing and coughing noted on auscultation Patient reports, she uses Breztri inhaler at home.  Resumed twice daily.  Continue as needed bronchodilators. Add Mucinex scheduled and incentive spirometry.  Chronic diastolic CHF Essential hypertension PTA Toprol, amlodipine, torsemide, losartan Torsemide and losartan on hold because of AKI.  Looks clinically dry and hence started on fluid today. Toprol held this morning because of bradycardia post lead extraction Currently on amlodipine.  Continue to monitor blood pressure.  Paroxysmal A-fib  H/o SSS s/p PPM PTA on Toprol, amiodarone, Eliquis.   Currently in sinus bradycardia post lead extraction. Continue amiodarone.  Toprol on hold.  Eliquis on hold.  Okay to resume once cleared by EP  CAD/HLD Continue statin.  Anticoagulation plan as above.  Type 2 diabetes with hyperglycemia Diabetic polyneuropathy A1c 6.8 on 09/04/2022 Blood sugar running elevated.  Semglee increased to 25 units this am. Continue sliding scale insulin with Accu-Cheks. Continue gabapentin Recent Labs  Lab 09/08/22 0746 09/08/22 1040 09/08/22 1854 09/08/22 2128 09/09/22 0618  GLUCAP 252* 205* 211* 256* 195*     Hypothyroidism Synthroid  Obesity  Body mass index is 39.84 kg/m. Patient has been advised to make an attempt to improve diet and exercise patterns to aid in weight loss.  Impaired mobility PT eval ordered.  Goals of care   Code Status: DNR    DVT prophylaxis: Heparin scSCDs Start: 09/08/22 1844  Antimicrobials: IV Vancomycin per ID Fluid: None Consultants: Podiatry, vascular surgery, ID, cardiology Family Communication: Husband at bedside  Status: Inpatient Level of care:  Telemetry Surgical   Patient from: Home Anticipated d/c to: Pending clinical course Needs to continue in-hospital care:  MRSA bacteremia.  Lead extraction yesterday.  Ongoing management.   Diet:  Diet Order             Diet Heart Room service appropriate? Yes; Fluid consistency: Thin  Diet effective now                   Scheduled Meds:  sodium chloride   Intravenous Once   amiodarone  200 mg Oral Daily   amLODipine  5 mg Oral Daily   atorvastatin  40 mg Oral Daily   Budeson-Glycopyrrol-Formoterol  2 puff Inhalation BID   vitamin D3  1,000 Units Oral Q lunch   cyanocobalamin  1,000 mcg Oral Q lunch   gabapentin  200 mg Oral TID   insulin aspart  0-15 Units Subcutaneous TID WC   insulin aspart  0-5 Units Subcutaneous QHS   insulin glargine-yfgn  25 Units Subcutaneous Daily   levothyroxine  200 mcg Oral QAC breakfast    PRN meds: acetaminophen, albuterol, guaiFENesin-dextromethorphan, HYDROcodone-acetaminophen, HYDROmorphone (DILAUDID) injection, melatonin, ondansetron (ZOFRAN) IV, phenol, polyethylene glycol, prochlorperazine   Infusions:   sodium chloride 75 mL/hr at 09/09/22 1006   DAPTOmycin (CUBICIN) 600 mg in sodium chloride 0.9 % IVPB 600 mg (09/07/22 1659)   sodium chloride      Antimicrobials: Anti-infectives (From admission, onward)    Start     Dose/Rate Route Frequency Ordered Stop   09/08/22 0956  ceFAZolin (ANCEF) 2-4 GM/100ML-% IVPB  Status:  Discontinued        Note to Pharmacy: [facility], [place]: cabinet override      09/08/22 0956 09/08/22 1002   09/08/22 0700  gentamicin (GARAMYCIN) 80 mg in sodium chloride 0.9 % 500 mL irrigation        80 mg Irrigation To Short Stay 09/08/22 0654 09/08/22 1519   09/08/22 0700  ceFAZolin (ANCEF) IVPB 2g/100 mL premix        2 g 200 mL/hr over 30 Minutes Intravenous To Short Stay 09/08/22 0654 09/08/22 1612   09/07/22 1600  DAPTOmycin (CUBICIN) 600 mg in sodium chloride 0.9 % IVPB        8 mg/kg  74.4 kg (Adjusted) 124 mL/hr over 30 Minutes Intravenous Every 48 hours 09/07/22 1317     09/07/22 1000  vancomycin (VANCOREADY) IVPB 1500 mg/300 mL  Status:  Discontinued        1,500 mg 150 mL/hr over 120 Minutes Intravenous every 72 hours 09/04/22 0105 09/05/22 1019   09/06/22 1200  vancomycin (VANCOREADY) IVPB 750 mg/150 mL  Status:  Discontinued        750 mg 150 mL/hr over 60 Minutes Intravenous Every 48 hours 09/05/22 1019 09/07/22 1317   09/04/22  2200  metroNIDAZOLE (FLAGYL) IVPB 500 mg  Status:  Discontinued       See Hyperspace for full Linked Orders Report.   500 mg 100 mL/hr over 60 Minutes Intravenous Every 12 hours 09/04/22 1022 09/07/22 0830   09/04/22 1100  cefTRIAXone (ROCEPHIN) 2 g in sodium chloride 0.9 % 100 mL IVPB  Status:  Discontinued       See Hyperspace for full Linked Orders Report.   2 g 200 mL/hr over 30 Minutes Intravenous Every 24 hours 09/04/22 1022 09/07/22 0830   09/04/22 0200  vancomycin (VANCOREADY) IVPB 2000 mg/400 mL        2,000 mg 200 mL/hr over 120 Minutes Intravenous  Once 09/04/22 0105 09/04/22 0626   09/03/22 2230  cefTRIAXone (ROCEPHIN) 2 g in sodium chloride 0.9 % 100 mL IVPB  Status:  Discontinued       See Hyperspace for full Linked Orders Report.   2 g 200 mL/hr over 30 Minutes Intravenous Every 24 hours 09/03/22 2218 09/04/22 1022   09/03/22 2230  metroNIDAZOLE (FLAGYL) IVPB 500 mg  Status:  Discontinued       See Hyperspace for full Linked Orders Report.   500  mg 100 mL/hr over 60 Minutes Intravenous Every 12 hours 09/03/22 2218 09/04/22 1022       Nutritional status:  Body mass index is 39.84 kg/m.          Objective: Vitals:   09/09/22 0804 09/09/22 1044  BP: (!) 122/51 (!) 165/66  Pulse: (!) 52 (!) 52  Resp: 20 17  Temp: (!) 97.4 F (36.3 C) 98.7 F (37.1 C)  SpO2: 90% 90%    Intake/Output Summary (Last 24 hours) at 09/09/2022 1049 Last data filed at 09/09/2022 0600 Gross per 24 hour  Intake 1823 ml  Output 250 ml  Net 1573 ml    Filed Weights   09/04/22 0451 09/08/22 0500 09/09/22 0500  Weight: 100.5 kg 105.2 kg 108.6 kg   Weight change: 3.4 kg Body mass index is 39.84 kg/m.   Physical Exam: General exam: Pleasant, elderly Caucasian female.  Looks dry.  Feels weak Skin: No rashes, lesions or ulcers. HEENT: Atraumatic, normocephalic, no obvious bleeding Lungs: Mild diffuse wheezing noted.  Coughs on deep breathing CVS: Regular rate and rhythm, no murmur.  Pacemaker extract site bandaged. GI/Abd soft, nontender, nondistended, bowel present CNS: Alert, awake, oriented x 3 Psychiatry: Sad affect Extremities: No pedal edema, no calf tenderness.  Status post left great toe amputation   Data Review: I have personally reviewed the laboratory data and studies available.  F/u labs ordered Unresulted Labs (From admission, onward)     Start     Ordered   09/08/22 0500  CK  Weekly,   R      09/07/22 1317   09/08/22 0425  Surgical PCR screen  (Screening)  Once,   R        09/08/22 0424   09/06/22 0944  Miscellaneous [facility] test (send-out)  Add-on,   AD       Comments: dapto sens on blood cx 5/2   Question:  Test name / description:  Answer:  [id number]   09/06/22 0944            Total time spent in review of labs and imaging, patient evaluation, formulation of plan, documentation and communication with family: 55 minutes  Signed, Lorin Glass, MD [facility] 09/09/2022

## 2022-09-09 NOTE — Plan of Care (Signed)
  Problem: Fluid Volume: Goal: Ability to maintain a balanced intake and output will improve Outcome: Not Progressing   Problem: Health Behavior/Discharge Planning: Goal: Ability to manage health-related needs will improve Outcome: Not Progressing   Problem: Metabolic: Goal: Ability to maintain appropriate glucose levels will improve Outcome: Not Progressing   Problem: Nutritional: Goal: Progress toward achieving an optimal weight will improve Outcome: Not Progressing   Problem: Skin Integrity: Goal: Risk for impaired skin integrity will decrease Outcome: Not Progressing   Problem: Health Behavior/Discharge Planning: Goal: Ability to manage health-related needs will improve Outcome: Not Progressing   Problem: Activity: Goal: Risk for activity intolerance will decrease Outcome: Not Progressing   Problem: Pain Managment: Goal: General experience of comfort will improve Outcome: Not Progressing

## 2022-09-09 NOTE — Anesthesia Postprocedure Evaluation (Signed)
Anesthesia Post Note  Patient: Kayla [name]  Procedure(s) Performed: LEAD EXTRACTION     Patient location during evaluation: PACU Anesthesia Type: General Level of consciousness: awake and alert Pain management: pain level controlled Vital Signs Assessment: post-procedure vital signs reviewed and stable Respiratory status: spontaneous breathing, nonlabored ventilation, respiratory function stable and patient connected to nasal cannula oxygen Cardiovascular status: blood pressure returned to baseline and stable Postop Assessment: no apparent nausea or vomiting Anesthetic complications: no   There were no known notable events for this encounter.  Last Vitals:  Vitals:   09/09/22 0500 09/09/22 0804  BP: (!) 150/63 (!) 122/51  Pulse: (!) 49 (!) 52  Resp: 19 20  Temp:  (!) 36.3 C  SpO2: 90% 90%    Last Pain:  Vitals:   09/09/22 0804  TempSrc: Oral  PainSc:                  Suzzanne Brunkhorst S

## 2022-09-09 NOTE — Progress Notes (Signed)
Pt was transferred to room 4e06 in the bed by this RN. All belonging went with the pt including pt's personal cpap. Receiving RN at bedside.

## 2022-09-09 NOTE — Evaluation (Signed)
Physical Therapy Evaluation Patient Details Name: Kayla [name] MRN: [id number] DOB: Feb 24, 1941 Today's Date: 09/09/2022  History of Present Illness  Pt is 82 year old presented to [facility] on  09/03/22 for lt great toe osteomyelitis. Underwent lt great toe amputation on 09/04/22. Pt also found to have MRSA bacteremia. Pt also with rt great toe fx. Pt underwent pacer extraction on 09/08/22. PMH - copd on 3L O2, OSA, pacer, afib, DM, lymphedema, TKA, lt 4th toe amputation  Clinical Impression  Pt presents to PT with significant decline in mobility after multiple surgeries and limited mobility due to medical issues. Pt was ambulatory at home but today difficulty standing with mod assist. Hopefully mobility will improve as pt is able to use her LUE on a walker. At this time feel patient will benefit from continued post acute follow up therapy, <3 hours/day. If she progresses quickly that may be able to shift to home.         Recommendations for follow up therapy are one component of a multi-disciplinary discharge planning process, led by the attending physician.  Recommendations may be updated based on patient status, additional functional criteria and insurance authorization.  Follow Up Recommendations Can patient physically be transported by private vehicle: No     Assistance Recommended at Discharge Frequent or constant Supervision/Assistance  Patient can return home with the following  A lot of help with walking and/or transfers;A lot of help with bathing/dressing/bathroom;Assist for transportation;Help with stairs or ramp for entrance;Assistance with cooking/housework    Equipment Recommendations Wheelchair (measurements PT);Wheelchair cushion (measurements PT)  Recommendations for Other Services       Functional Status Assessment Patient has had a recent decline in their functional status and demonstrates the ability to make significant improvements in function in a reasonable and predictable  amount of time.     Precautions / Restrictions Precautions Precautions: ICD/Pacemaker;Fall Precaution Comments: treat temporary pacer as you would regular pacer per Cardiology Required Braces or Orthoses: Sling;Splint/Cast;Other Brace Splint/Cast: LUE until 24 hrs post pacer removal/placemetn Other Brace: Bilateral post op shoes Restrictions Weight Bearing Restrictions: Yes RLE Weight Bearing: Weight bearing as tolerated LLE Weight Bearing: Weight bearing as tolerated Other Position/Activity Restrictions: in post op shoes      Mobility  Bed Mobility Overal bed mobility: Needs Assistance Bed Mobility: Supine to Sit, Sit to Supine     Supine to sit: Mod assist Sit to supine: Mod assist   General bed mobility comments: Assist to elevate trunk and bring hips to EOB. Assist to bring legs back up into bed    Transfers Overall transfer level: Needs assistance Equipment used: Rolling walker (2 wheels), 1 person hand held assist Transfers: Sit to/from Stand Sit to Stand: Mod assist           General transfer comment: Assist to bring hips up and for balance. Pt using RUE only on walker or on my forearm. LUE left in sling due to <24 hours since surgery    Ambulation/Gait               General Gait Details: Unable to shift/support weight adequately to take any side steps  Stairs            Wheelchair Mobility    Modified Rankin (Stroke Patients Only)       Balance Overall balance assessment: Needs assistance Sitting-balance support: Single extremity supported, Feet supported Sitting balance-Leahy Scale: Poor Sitting balance - Comments: Prefers UE suppport   Standing balance support: Single extremity  supported, During functional activity Standing balance-Leahy Scale: Poor Standing balance comment: RUE support and min/mod assist for static standing                             Pertinent Vitals/Pain Pain Assessment Pain Assessment:  Faces Faces Pain Scale: Hurts little more Pain Location: Pacer site Pain Descriptors / Indicators: Grimacing Pain Intervention(s): Monitored during session, Limited activity within patient's tolerance    Home Living Family/patient expects to be discharged to:: Private residence Living Arrangements: Spouse/significant other Available Help at Discharge: Family;Available 24 hours/day Type of Home: House Home Access: Ramped entrance       Home Layout: One level Home Equipment: Rollator (4 wheels);Rolling Walker (2 wheels);Cane - single point;Shower seat (home O2) Additional Comments: Pt says she has a rollator at home but husband states she doesn't    Prior Function Prior Level of Function : Needs assist       Physical Assist : ADLs (physical)   ADLs (physical): IADLs;Dressing;Bathing Mobility Comments: amb modified independent with walker. ADLs Comments: Husband assists with donning socks and shoes as well as tub transfers. husband performs majority of IADLs     Hand Dominance   Dominant Hand: Right    Extremity/Trunk Assessment   Upper Extremity Assessment Upper Extremity Assessment: Defer to OT evaluation    Lower Extremity Assessment Lower Extremity Assessment: Generalized weakness       Communication   Communication: No difficulties  Cognition Arousal/Alertness: Awake/alert Behavior During Therapy: WFL for tasks assessed/performed Overall Cognitive Status: Within Functional Limits for tasks assessed                                 General Comments: Pt adhering to pacer precautions        General Comments General comments (skin integrity, edema, etc.): HR generally in 50's. Toward end of treatment HR dropped into 20's. Pt asymptomatic. Nurse called CCMD to check if pacer kicked in. SpO2 >90% on 3L.    Exercises     Assessment/Plan    PT Assessment Patient needs continued PT services  PT Problem List Decreased strength;Decreased  balance;Decreased mobility;Decreased knowledge of precautions;Decreased safety awareness;Cardiopulmonary status limiting activity;Obesity;Pain       PT Treatment Interventions DME instruction;Gait training;Functional mobility training;Therapeutic activities;Therapeutic exercise;Balance training;Patient/family education    PT Goals (Current goals can be found in the Care Plan section)  Acute Rehab PT Goals Patient Stated Goal: return home PT Goal Formulation: With patient/family Time For Goal Achievement: 09/23/22 Potential to Achieve Goals: Fair    Frequency Min 1X/week     Co-evaluation               AM-PAC PT "6 Clicks" Mobility  Outcome Measure Help needed turning from your back to your side while in a flat bed without using bedrails?: A Little Help needed moving from lying on your back to sitting on the side of a flat bed without using bedrails?: A Lot Help needed moving to and from a bed to a chair (including a wheelchair)?: Total Help needed standing up from a chair using your arms (e.g., wheelchair or bedside chair)?: A Lot Help needed to walk in hospital room?: Total Help needed climbing 3-5 steps with a railing? : Total 6 Click Score: 10    End of Session Equipment Utilized During Treatment: Oxygen;Other (comment) (sling, bilateral post op shoes) Activity Tolerance: Patient tolerated  treatment well Patient left: in bed;with call bell/phone within reach;with family/visitor present Nurse Communication: Mobility status PT Visit Diagnosis: Unsteadiness on feet (R26.81);Other abnormalities of gait and mobility (R26.89);Difficulty in walking, not elsewhere classified (R26.2)    Time: 6045-4098 PT Time Calculation (min) (ACUTE ONLY): 37 min   Charges:   PT Evaluation $PT Eval Moderate Complexity: 1 Mod PT Treatments $Therapeutic Activity: 8-22 mins        [facility] PT Acute Rehabilitation Services Office [phone]   Angelina Ok [facility] 09/09/2022, 12:03  PM

## 2022-09-09 NOTE — Progress Notes (Addendum)
PHARMACY CONSULT NOTE FOR:  OUTPATIENT  PARENTERAL ANTIBIOTIC THERAPY (OPAT)  Indication: MRSA bacteremia 2/2 CIED infection  Regimen: Daptomycin 600 mg IV every 48 hours  End date: 10/20/22  IV antibiotic discharge orders are pended. To discharging provider:  please sign these orders via discharge navigator,  Select New Orders & click on the button choice - Manage This Unsigned Work.     Thank you for allowing pharmacy to be a part of this patient's care.  Sharin Mons, PharmD, BCPS, BCIDP Infectious Diseases Clinical Pharmacist Phone: [phone] 09/09/2022, 1:26 PM

## 2022-09-09 NOTE — [place] FL2 (Signed)
[place] MEDICAID FL2 LEVEL OF CARE FORM     IDENTIFICATION  Patient Name: Kayla [name] Birthdate: [birth year] Sex: female Admission Date (Current Location): 09/03/2022  [facility] and [place] Number:  Producer, television/film/video and Address:  [facility], [address] [place], [place] [address]      Provider Number: [id number]  Attending Physician Name and Address:  Lorin Glass, MD  Relative Name and Phone Number:       Current Level of Care: Hospital Recommended Level of Care: Skilled Nursing Facility Prior Approval Number:    Date Approved/Denied:   PASRR Number: [id number]  Discharge Plan: SNF    Current Diagnoses: Patient Active Problem List   Diagnosis Date Noted   Closed nondisplaced fracture of proximal phalanx of right great toe 09/06/2022   Pyogenic inflammation of bone (HCC) 09/05/2022   Acute hematogenous osteomyelitis of left foot (HCC) 09/04/2022   Diabetic foot infection (HCC) 09/03/2022   Chronic respiratory failure with hypoxia (HCC) 02/20/2022   Pleuritic pain 02/20/2022   Toe osteomyelitis, left (HCC) 01/20/2022   Carotid bruit 07/07/2021   Septic prepatellar bursitis of right knee 05/30/2021   Cellulitis of right lower extremity 03/19/2021   Chronic kidney disease, stage 3b (HCC) 03/19/2021   Polymyalgia rheumatica (HCC) 04/15/2020   Temporal arteritis (HCC) 04/15/2020   Pacemaker - MDT 01/24/2020   Orthostatic lightheadedness 01/24/2020   Chronic osteomyelitis involving left ankle and foot (HCC)    Paroxysmal atrial fibrillation (HCC) 10/25/2019   Secondary hypercoagulable state (HCC) 10/25/2019   Numbness and tingling of right leg 10/19/2019   Lumbar radiculopathy 10/19/2019   Chronic diastolic heart failure (HCC) 08/13/2019   Chest pain 08/13/2019   Leukocytosis 08/13/2019   Leg swelling 12/09/2018   Urinary frequency 12/09/2018   Pain in joint of right hip 01/21/2018   Myofascial pain syndrome, cervical  01/06/2018   Occipital neuralgia of right side 01/06/2018   RUQ abdominal pain    Gastritis and gastroduodenitis    Bile duct abnormality    Cholecystitis 04/30/2016   Gall bladder disease 04/28/2016   Sinus node dysfunction (HCC) 11/08/2015   Ejection fraction    Spinal stenosis of cervical region 11/07/2014   Preoperative clearance 10/09/2014   Carotid artery disease (HCC)    S/P left TKA 07/27/2011   Dizzy spells 05/04/2011   Palpitations 01/21/2011   RHINITIS 07/21/2010   Hyperlipidemia associated with type 2 diabetes mellitus (HCC) 11/24/2008   OBESITY 11/24/2008   Coronary atherosclerosis 11/24/2008   VENTRICULAR HYPERTROPHY, LEFT 11/24/2008   HYPOTHYROIDISM, POSTSURGICAL 06/04/2008   Headache 06/04/2008   Insulin dependent type 2 diabetes mellitus (HCC) 03/23/2007   Esophageal reflux 03/23/2007   DIVERTICULOSIS OF COLON 03/23/2007   GENERALIZED OSTEOARTHROSIS UNSPECIFIED SITE 03/23/2007   OSA (obstructive sleep apnea) 02/16/2007   Hypertension associated with diabetes (HCC) 02/16/2007   COPD (chronic obstructive pulmonary disease) (HCC) 02/16/2007    Orientation RESPIRATION BLADDER Height & Weight     Self, Time, Situation, Place  O2 (3 liters) Continent, External catheter Weight: 239 lb 6.7 oz (108.6 kg) Height:  5\' 5"  (165.1 cm)  BEHAVIORAL SYMPTOMS/MOOD NEUROLOGICAL BOWEL NUTRITION STATUS      Continent Diet (See dc summary)  AMBULATORY STATUS COMMUNICATION OF NEEDS Skin   Extensive Assist Verbally Normal                       Personal Care Assistance Level of Assistance  Bathing, Feeding, Dressing Bathing Assistance: Maximum assistance  Feeding assistance: Independent Dressing Assistance: Maximum assistance     Functional Limitations Info  Sight, Hearing, Speech Sight Info: Impaired Hearing Info: Adequate Speech Info: Adequate    SPECIAL CARE FACTORS FREQUENCY  PT (By licensed PT), OT (By licensed OT)     PT Frequency: 5xweek OT Frequency:  5xweek            Contractures Contractures Info: Not present    Additional Factors Info  Code Status, Allergies Code Status Info: DNR Allergies Info: Dulaglutide  Oxycodone  Trelegy Ellipta (Fluticasone-umeclidin-vilant)           Current Medications (09/09/2022):  This is the current hospital active medication list Current Facility-Administered Medications  Medication Dose Route Frequency Provider Last Rate Last Admin   0.9 %  sodium chloride infusion (Manually program via Guardrails IV Fluids)   Intravenous Once Marinus Maw, MD   Stopped at 09/08/22 1935   0.9 %  sodium chloride infusion   Intravenous Continuous Dahal, Melina Schools, MD 75 mL/hr at 09/09/22 1500 Infusion Verify at 09/09/22 1500   acetaminophen (TYLENOL) tablet 500 mg  500 mg Oral Q8H PRN Marinus Maw, MD   500 mg at 09/09/22 0526   albuterol (PROVENTIL) (2.5 MG/3ML) 0.083% nebulizer solution 2.5 mg  2.5 mg Nebulization Q2H PRN Marinus Maw, MD   2.5 mg at 09/09/22 1154   amiodarone (PACERONE) tablet 200 mg  200 mg Oral Daily Marinus Maw, MD   200 mg at 09/09/22 0953   amLODipine (NORVASC) tablet 5 mg  5 mg Oral Daily Marinus Maw, MD   5 mg at 09/09/22 0953   atorvastatin (LIPITOR) tablet 40 mg  40 mg Oral Daily Marinus Maw, MD   40 mg at 09/09/22 0953   Budeson-Glycopyrrol-Formoterol 160-9-4.8 MCG/ACT AERO 2 puff  2 puff Inhalation BID Dahal, Melina Schools, MD       cholecalciferol (VITAMIN D3) 25 MCG (1000 UNIT) tablet 1,000 Units  1,000 Units Oral Q lunch Marinus Maw, MD   1,000 Units at 09/09/22 1158   cyanocobalamin (VITAMIN B12) tablet 1,000 mcg  1,000 mcg Oral Q lunch Marinus Maw, MD   1,000 mcg at 09/09/22 1158   DAPTOmycin (CUBICIN) 600 mg in sodium chloride 0.9 % IVPB  8 mg/kg (Adjusted) Intravenous Q48H Marinus Maw, MD 124 mL/hr at 09/07/22 1659 600 mg at 09/07/22 1659   gabapentin (NEURONTIN) capsule 200 mg  200 mg Oral TID Marinus Maw, MD   200 mg at 09/09/22 0953   guaiFENesin  (MUCINEX) 12 hr tablet 600 mg  600 mg Oral BID Dahal, Melina Schools, MD   600 mg at 09/09/22 1157   guaiFENesin-dextromethorphan (ROBITUSSIN DM) 100-10 MG/5ML syrup 5 mL  5 mL Oral Q4H PRN Lorin Glass, MD   5 mL at 09/09/22 0953   HYDROcodone-acetaminophen (NORCO) 7.5-325 MG per tablet 1 tablet  1 tablet Oral Q6H PRN Marinus Maw, MD   1 tablet at 09/08/22 2329   HYDROmorphone (DILAUDID) injection 0.5 mg  0.5 mg Intravenous Q4H PRN Marinus Maw, MD   0.5 mg at 09/08/22 1900   insulin aspart (novoLOG) injection 0-15 Units  0-15 Units Subcutaneous TID WC Marinus Maw, MD   8 Units at 09/09/22 1157   insulin aspart (novoLOG) injection 0-5 Units  0-5 Units Subcutaneous QHS Marinus Maw, MD   3 Units at 09/08/22 2140   insulin glargine-yfgn (SEMGLEE) injection 25 Units  25 Units Subcutaneous Daily Dahal, Melina Schools, MD  25 Units at 09/09/22 0953   levothyroxine (SYNTHROID) tablet 200 mcg  200 mcg Oral QAC breakfast Marinus Maw, MD   200 mcg at 09/09/22 0526   melatonin tablet 5 mg  5 mg Oral QHS PRN Marinus Maw, MD   5 mg at 09/08/22 2138   ondansetron [facility]) injection 4 mg  4 mg Intravenous Q6H PRN Marinus Maw, MD   4 mg at 09/08/22 2202   phenol (CHLORASEPTIC) mouth spray 1 spray  1 spray Mouth/Throat PRN Dahal, Melina Schools, MD   1 spray at 09/09/22 0955   polyethylene glycol (MIRALAX / GLYCOLAX) packet 17 g  17 g Oral Daily PRN Marinus Maw, MD       prochlorperazine (COMPAZINE) injection 5 mg  5 mg Intravenous Q6H PRN Marinus Maw, MD       sodium chloride 0.9 % bolus 1,000 mL  1,000 mL Intravenous Once Marinus Maw, MD         Discharge Medications: Please see discharge summary for a list of discharge medications.  Relevant Imaging Results:  Relevant Lab Results:   Additional Information SSN: [id number]  Oletta Lamas, MSW, [facility] Transitions of Care  Clinical Social Worker I

## 2022-09-09 NOTE — Progress Notes (Signed)
[facility]  Date of Admission:  09/03/2022   Total days of inpatient antibiotics 5  Principal Problem:   Diabetic foot infection (HCC) Active Problems:   Acute hematogenous osteomyelitis of left foot (HCC)   Pyogenic inflammation of bone (HCC)   Closed nondisplaced fracture of proximal phalanx of right great toe          Assessment: 82 year old female previous history of recurrent septic prepatellar bursitis status post washout in March 2023 or cultures grew MRSA treated with daptomycin x 6 weeks admitted with left great toe foot infection:   #MRSA bacteremia 2/2 CIED likely seeded by foot #CIED infection - Blood cultures on 5/2 grew 2/2 MRSA  - PPM site is tender on palpation   #DFU is post first ray amputation on 5/3 noted that was no purulence or proximal tracking.OR Cx NG -Sent o [facility] ED at request of podiatry to to c/f possible osteo on xrya.  - MRI left foot without contrast on 5/3 showed tissue loss around great toe with sinus tract stenting to proximal phalanx with 1.3 X1.3X 1.2 cm soft tissue abscess.  Concerning for superimposed osteomyelitis.  Metallic  body body and fifth and fourth metatarsal head. -Taken to the OR for left partial first ray amputation, podiatry Dr. Ardelle Anton.Pt states she was on doxy x10 days prior to hospitalization(seen by Dr. Allena Katz -Podiatry on 4/18) outpatient.  -TTE showed mobile mass on RA  lead. TEE showed RV lead veg. EP engaged and PPM extracted.  Recommendations: - Continue daptomycin, plan to compete 6 weeks of antibiotics from extraction on 5/7 -Follow epeat Cx to ensure clearance. I repeated blood Cx following lead extraction as well.  - PICC in place - Follow dapto sens - F/U in ID clinic - Temp PPM in , possible wireless PPM in the future, card following    #Diabetes mellitus - A1c 6.8 on 09/04/2022   #PAF on Eliquis - Management per primary. Eliquis hild, awaiting clearance by EP   #CKD stage  IV - Scr 2.58 today    ID will sign off  OPAT ORDERS:  Diagnosis: MRSA bacteremia 2/2 CIED infection    Allergies  Allergen Reactions   Dulaglutide Nausea Only, Other (See Comments) and Cough    TRULICITY made the patient sick to her stomach (only) several days after using it   Oxycodone Itching and Other (See Comments)    Pt still takes this med even thought it causes itching   Trelegy Ellipta [Fluticasone-Umeclidin-Vilant] Nausea And Vomiting, Other (See Comments) and Cough    Made the patient sick to her stomach (only) several days after using it      Discharge antibiotics to be given via PICC line:  Per pharmacy protocol daptomycin 8 mg/kg/day   Duration: 6 weeks End Date: 10/20/22  [facility] Per Protocol with [employer] Use: Home health RN for IV administration and teaching, line care and labs.    Labs weekly while on IV antibiotics: __ xCBC with differential __ BMP **TWICE WEEKLY ON VANCOMYCIN  __x CMP _x_ CRP __ xESR __ Vancomycin trough TWICE WEEKLY __ CK x __ Please pull PIC at completion of IV antibiotics _x_ Please leave PIC in place until doctor has seen patient or been notified  Fax weekly labs to [phone]  Clinic Follow Up Appt: 6/5  @ [facility] with Dr. Thedore Mins   Microbiology:   Antibiotics: Vancomycin, ceftriaxone, metronidazole   Cultures: Blood 5/02 MRSA 5/5 MG  SUBJECTIVE: Resitng in bed. Husband at bedside. No new complaints.  Interval: Afebrile overnight  Review of Systems: Review of Systems  All other systems reviewed and are negative.    Scheduled Meds:  sodium chloride   Intravenous Once   amiodarone  200 mg Oral Daily   amLODipine  5 mg Oral Daily   atorvastatin  40 mg Oral Daily   Budeson-Glycopyrrol-Formoterol  2 puff Inhalation BID   vitamin D3  1,000 Units Oral Q lunch   cyanocobalamin  1,000 mcg Oral Q lunch   gabapentin  200 mg Oral TID   guaiFENesin  600 mg Oral BID   insulin aspart  0-15 Units Subcutaneous  TID WC   insulin aspart  0-5 Units Subcutaneous QHS   insulin glargine-yfgn  25 Units Subcutaneous Daily   levothyroxine  200 mcg Oral QAC breakfast   Continuous Infusions:  sodium chloride 75 mL/hr at 09/09/22 1006   DAPTOmycin (CUBICIN) 600 mg in sodium chloride 0.9 % IVPB 600 mg (09/07/22 1659)   sodium chloride     PRN Meds:.acetaminophen, albuterol, guaiFENesin-dextromethorphan, HYDROcodone-acetaminophen, HYDROmorphone (DILAUDID) injection, melatonin, ondansetron (ZOFRAN) IV, phenol, polyethylene glycol, prochlorperazine Allergies  Allergen Reactions   Dulaglutide Nausea Only, Other (See Comments) and Cough    TRULICITY made the patient sick to her stomach (only) several days after using it   Oxycodone Itching and Other (See Comments)    Pt still takes this med even thought it causes itching   Trelegy Ellipta [Fluticasone-Umeclidin-Vilant] Nausea And Vomiting, Other (See Comments) and Cough    Made the patient sick to her stomach (only) several days after using it     OBJECTIVE: Vitals:   09/09/22 0100 09/09/22 0500 09/09/22 0804 09/09/22 1044  BP: (!) 135/95 (!) 150/63 (!) 122/51 (!) 165/66  Pulse: (!) 53 (!) 49 (!) 52 (!) 52  Resp: 14 19 20 17   Temp: 98.1 F (36.7 C)  (!) 97.4 F (36.3 C) 98.7 F (37.1 C)  TempSrc: Oral  Oral Oral  SpO2: (!) 89% 90% 90% 90%  Weight:  108.6 kg    Height:       Body mass index is 39.84 kg/m.  Physical Exam Constitutional:      Appearance: Normal appearance.  HENT:     Head: Normocephalic and atraumatic.     Right Ear: Tympanic membrane normal.     Left Ear: Tympanic membrane normal.     Nose: Nose normal.     Mouth/Throat:     Mouth: Mucous membranes are moist.  Eyes:     Extraocular Movements: Extraocular movements intact.     Conjunctiva/sclera: Conjunctivae normal.     Pupils: Pupils are equal, round, and reactive to light.  Cardiovascular:     Rate and Rhythm: Normal rate and regular rhythm.     Heart sounds: No murmur  heard.    No friction rub. No gallop.  Pulmonary:     Effort: Pulmonary effort is normal.     Breath sounds: Normal breath sounds.  Abdominal:     General: Abdomen is flat.     Palpations: Abdomen is soft.  Musculoskeletal:     Comments: Foot bandaged  Skin:    General: Skin is warm and dry.  Neurological:     General: No focal deficit present.     Mental Status: She is alert and oriented to person, place, and time.  Psychiatric:        Mood and Affect: Mood normal.  Lab Results Lab Results  Component Value Date   WBC 13.6 (H) 09/09/2022   HGB 8.5 (L) 09/09/2022   HCT 26.5 (L) 09/09/2022   MCV 96.0 09/09/2022   PLT 389 09/09/2022    Lab Results  Component Value Date   CREATININE 2.58 (H) 09/09/2022   BUN 42 (H) 09/09/2022   NA 133 (L) 09/09/2022   K 4.7 09/09/2022   CL 99 09/09/2022   CO2 24 09/09/2022    Lab Results  Component Value Date   ALT 14 09/03/2022   AST 16 09/03/2022   ALKPHOS 64 09/03/2022   BILITOT 0.3 09/03/2022        Danelle Earthly, MD [facility] 09/09/2022, 11:10 AM

## 2022-09-09 NOTE — TOC Progression Note (Signed)
Transition of Care [facility]) - Progression Note    Patient Details  Name: COCOA LEICK MRN: [id number] Date of Birth: 01-Oct-1940  Transition of Care [facility]) CM/SW Contact  Leander Rams, LCSW Phone Number: 09/09/2022, 4:05 PM  Clinical Narrative:    CSW met with pt at bedside alongside husband and son. Pt expressed she is agreeable to dc to STR at SNF when dc and requested [facility]. If [facility] is unavailable pt, is agreeable to other facilities [address] of [place].   CSW completed fl2 and faxed out. TOC will continue to follow.    Expected Discharge Plan: Home w Home Health Services Barriers to Discharge: Continued Medical Work up  Expected Discharge Plan and Services   Discharge Planning Services: CM Consult                       DME Agency: Other - Comment ([facility]) Date DME Agency Contacted: 09/07/22 Time DME Agency Contacted: 1357 Representative spoke with at DME Agency: Pam HH Arranged: RN HH Agency: [facility] Date [facility] Agency Contacted: 09/07/22 Time HH Agency Contacted: 1358 Representative spoke with at [facility] Agency: Kandee Keen   Social Determinants of Health (SDOH) Interventions SDOH Screenings   Food Insecurity: No Food Insecurity (01/20/2022)  Housing: Low Risk  (01/20/2022)  Transportation Needs: No Transportation Needs (01/20/2022)  Utilities: Not At Risk (01/20/2022)  Alcohol Screen: Low Risk  (01/31/2021)  Depression (PHQ2-9): Low Risk  (09/30/2021)  Recent Concern: Depression (PHQ2-9) - Medium Risk (08/18/2021)  Financial Resource Strain: Low Risk  (01/31/2021)  Physical Activity: Insufficiently Active (09/30/2021)  Social Connections: Socially Integrated (09/30/2021)  Stress: No Stress Concern Present (09/30/2021)  Tobacco Use: Medium Risk (09/08/2022)    Readmission Risk Interventions    01/22/2022    2:43 PM 07/23/2021   12:10 PM 03/21/2021   12:53 PM  Readmission Risk Prevention Plan  Transportation Screening Complete  Complete Complete  PCP or Specialist Appt within 3-5 Days Complete    HRI or Home Care Consult Complete    Social Work Consult for Recovery Care Planning/Counseling Complete    Palliative Care Screening Not Applicable    Medication Review Oceanographer) Complete Complete Complete  HRI or Home Care Consult  Complete   SW Recovery Care/Counseling Consult  Complete   Palliative Care Screening  Not Applicable   Skilled Nursing Facility  Complete   Oletta Lamas, MSW, [place], LCASA Transitions of Care  Clinical Social Worker I

## 2022-09-09 NOTE — Progress Notes (Addendum)
Patient Name: Kayla [name] Date of Encounter: 09/09/2022  Primary Cardiologist: Donato Schultz, MD Electrophysiologist: Sherryl Manges, MD  Interval Summary   The patient reports wheezing, but improved after breathing treatment. States her pacemaker site is painful post procedure.  At this time, the patient denies chest pain, shortness of breath, or any new concerns.  Inpatient Medications    Scheduled Meds:  sodium chloride   Intravenous Once   amiodarone  200 mg Oral Daily   amLODipine  5 mg Oral Daily   atorvastatin  40 mg Oral Daily   vitamin D3  1,000 Units Oral Q lunch   cyanocobalamin  1,000 mcg Oral Q lunch   gabapentin  200 mg Oral TID   insulin aspart  0-15 Units Subcutaneous TID WC   insulin aspart  0-5 Units Subcutaneous QHS   insulin glargine-yfgn  25 Units Subcutaneous Daily   levothyroxine  200 mcg Oral QAC breakfast   Continuous Infusions:  sodium chloride     DAPTOmycin (CUBICIN) 600 mg in sodium chloride 0.9 % IVPB 600 mg (09/07/22 1659)   sodium chloride     PRN Meds: acetaminophen, albuterol, guaiFENesin-dextromethorphan, HYDROcodone-acetaminophen, HYDROmorphone (DILAUDID) injection, melatonin, ondansetron (ZOFRAN) IV, phenol, polyethylene glycol, prochlorperazine   Vital Signs    Vitals:   09/08/22 2251 09/09/22 0100 09/09/22 0500 09/09/22 0804  BP: (!) 148/45 (!) 135/95 (!) 150/63 (!) 122/51  Pulse: (!) 55 (!) 53 (!) 49 (!) 52  Resp: (!) 22 14 19 20   Temp: 98.9 F (37.2 C) 98.1 F (36.7 C)  (!) 97.4 F (36.3 C)  TempSrc: Oral Oral  Oral  SpO2: 94% (!) 89% 90% 90%  Weight:   108.6 kg   Height:        Intake/Output Summary (Last 24 hours) at 09/09/2022 0825 Last data filed at 09/09/2022 0600 Gross per 24 hour  Intake 1823 ml  Output 250 ml  Net 1573 ml   Filed Weights   09/04/22 0451 09/08/22 0500 09/09/22 0500  Weight: 100.5 kg 105.2 kg 108.6 kg    Physical Exam    GEN- chronically ill appearing in NAD, alert and oriented x 3 today.    Lungs- Clear to ausculation bilaterally, normal work of breathing, mild upper airway wheeze Cardiac- Regular rate and rhythm, no murmurs, rubs or gallops GI- soft, NT, ND, + BS Extremities- no clubbing or cyanosis. No edema  Telemetry    SB 50-60's, brief period of 30's overnight, no pacing (personally reviewed)  Hospital Course    LAURIE JEPPSEN is a 82 y.o. female admitted for suspected CIED associated bacteremia at the request of Dr. Pola Corn. [name], diabetic polyneuropathy, sick sinus syndrome post permanent pacemaker implant, hyperlipidemia, obesity, chronic venous insufficiency, COPD, gout, iron deficiency anemia, hypertension, paroxysmal atrial fibrillation on Eliquis. She was admitted with left great toe diabetic foot infection and has recently undergone amputation of her great toe. During the hospitalization blood cultures have returned positive (2 of 2) for MRSA.   Assessment & Plan    CEID Associated Infection in setting of MRSA Bacteremia  2/2 BC positive for MRSA TEE 5/6 with RV lead vegetation at level of RA  -abx per ID   SSS/SND Atrially dependent prior to temp pacing wire placement, lead extraction on 5/6, post procedure VVI 30 with no pacing overnight.  -observe for intrinsic function  -pending above, may be candidate for leadless vs R sided re-implant in future   PAF Currently SB/SR -  hold eliquis -hold beta blocker  -continue amiodarone   Anemia  -Hgb stable, monitor trend  -per TRH   AKI on CKD  Baseline Cr ~ 1.8-2.5  -per [facility]     For questions or updates, please contact [facility] Please consult [email or web address] for contact info under Cardiology/STEMI.  Signed, Canary Brim, MSN, APRN, NP-C, AGACNP-BC [facility] - Electrophysiology  09/09/2022, 8:25 AM  EP Attending  Patient seen and examined. Agree with above. The patient is stable after DDD PM system extraction. She had a temporary  perm PM lead placed at the time of her procedure and she is not pacing. She will need IV anti-biotics as per the ID service, pulmonary toilet, and if her HR remains stable, we will plan to remove the temp perm pm lead in a day or two.   Sharlot Gowda Omolara Carol,MD

## 2022-09-10 ENCOUNTER — Telehealth: Payer: Self-pay

## 2022-09-10 ENCOUNTER — Inpatient Hospital Stay (HOSPITAL_COMMUNITY): Payer: Medicare HMO

## 2022-09-10 DIAGNOSIS — E11628 Type 2 diabetes mellitus with other skin complications: Secondary | ICD-10-CM | POA: Diagnosis not present

## 2022-09-10 DIAGNOSIS — L089 Local infection of the skin and subcutaneous tissue, unspecified: Secondary | ICD-10-CM | POA: Diagnosis not present

## 2022-09-10 LAB — BASIC METABOLIC PANEL
Anion gap: 10 (ref 5–15)
BUN: 42 mg/dL — ABNORMAL HIGH (ref 8–23)
CO2: 21 mmol/L — ABNORMAL LOW (ref 22–32)
Calcium: 7.2 mg/dL — ABNORMAL LOW (ref 8.9–10.3)
Chloride: 105 mmol/L (ref 98–111)
Creatinine, Ser: 2.39 mg/dL — ABNORMAL HIGH (ref 0.44–1.00)
GFR, Estimated: 20 mL/min — ABNORMAL LOW (ref >60)
Glucose, Bld: 332 mg/dL — ABNORMAL HIGH (ref 70–99)
Potassium: 3.6 mmol/L (ref 3.5–5.1)
Sodium: 136 mmol/L (ref 135–145)

## 2022-09-10 LAB — CBC
HCT: 27.8 % — ABNORMAL LOW (ref 36.0–46.0)
Hemoglobin: 8.8 g/dL — ABNORMAL LOW (ref 12.0–15.0)
MCH: 30.6 pg (ref 26.0–34.0)
MCHC: 31.7 g/dL (ref 30.0–36.0)
MCV: 96.5 fL (ref 80.0–100.0)
Platelets: 430 10*3/uL — ABNORMAL HIGH (ref 150–400)
RBC: 2.88 MIL/uL — ABNORMAL LOW (ref 3.87–5.11)
RDW: 14.6 % (ref 11.5–15.5)
WBC: 16.3 10*3/uL — ABNORMAL HIGH (ref 4.0–10.5)
nRBC: 0 % (ref 0.0–0.2)

## 2022-09-10 LAB — GLUCOSE, CAPILLARY
Glucose-Capillary: 180 mg/dL — ABNORMAL HIGH (ref 70–99)
Glucose-Capillary: 265 mg/dL — ABNORMAL HIGH (ref 70–99)
Glucose-Capillary: 349 mg/dL — ABNORMAL HIGH (ref 70–99)
Glucose-Capillary: 329 mg/dL — ABNORMAL HIGH (ref 70–99)
Glucose-Capillary: 236 mg/dL — ABNORMAL HIGH (ref 70–99)

## 2022-09-10 LAB — CULTURE, BLOOD (ROUTINE X 2): Culture: NO GROWTH

## 2022-09-10 MED ORDER — BUDESON-GLYCOPYRROL-FORMOTEROL 160-9-4.8 MCG/ACT IN AERO
2.0000 | INHALATION_SPRAY | Freq: Two times a day (BID) | RESPIRATORY_TRACT | Status: DC
  Administered 2022-09-10 – 2022-09-15 (×11): 2 via RESPIRATORY_TRACT

## 2022-09-10 MED ORDER — AMLODIPINE BESYLATE 10 MG PO TABS
10.0000 mg | ORAL_TABLET | Freq: Every day | ORAL | Status: DC
  Administered 2022-09-11 – 2022-09-15 (×5): 10 mg via ORAL
  Filled 2022-09-10 (×5): qty 1

## 2022-09-10 MED ORDER — AMLODIPINE BESYLATE 5 MG PO TABS
5.0000 mg | ORAL_TABLET | Freq: Once | ORAL | Status: AC
  Administered 2022-09-10: 5 mg via ORAL
  Filled 2022-09-10: qty 1

## 2022-09-10 MED ORDER — CHLORHEXIDINE GLUCONATE CLOTH 2 % EX PADS
6.0000 | MEDICATED_PAD | Freq: Every day | CUTANEOUS | Status: DC
  Administered 2022-09-10 – 2022-09-15 (×6): 6 via TOPICAL

## 2022-09-10 MED ORDER — ALUM & MAG HYDROXIDE-SIMETH 200-200-20 MG/5ML PO SUSP
30.0000 mL | Freq: Four times a day (QID) | ORAL | Status: DC | PRN
  Administered 2022-09-10: 30 mL via ORAL
  Filled 2022-09-10: qty 30

## 2022-09-10 MED ORDER — INSULIN GLARGINE-YFGN 100 UNIT/ML ~~LOC~~ SOLN
35.0000 [IU] | Freq: Every day | SUBCUTANEOUS | Status: DC
  Administered 2022-09-11 – 2022-09-13 (×3): 35 [IU] via SUBCUTANEOUS
  Filled 2022-09-10 (×3): qty 0.35

## 2022-09-10 MED ORDER — SODIUM CHLORIDE 0.9% FLUSH
10.0000 mL | Freq: Two times a day (BID) | INTRAVENOUS | Status: DC
  Administered 2022-09-10 – 2022-09-15 (×11): 10 mL

## 2022-09-10 MED ORDER — SODIUM CHLORIDE 0.9% FLUSH: 10.0000 mL | INTRAVENOUS | Status: DC | PRN

## 2022-09-10 MED ORDER — INSULIN GLARGINE-YFGN 100 UNIT/ML ~~LOC~~ SOLN: 28.0000 [IU] | Freq: Every day | SUBCUTANEOUS | Status: DC

## 2022-09-10 NOTE — Progress Notes (Signed)
PROGRESS NOTE  Kayla [name]  DOB: 1940-09-12  PCP: Ailene Ravel, MD ZOX:[id number]  DOA: 09/03/2022  LOS: 7 days  Hospital Day: 8  Brief narrative: Kayla [name] is a 82 y.o. female with PMH significant for obesity, OSA, DM2, HTN, HLD, CAD, CHF, paroxysmal A-fib on Eliquis, SSS s/p PPM, carotid artery disease, CKD, chronic anemia, COPD, anxiety/depression, GERD, hypothyroidism, diabetic polyneuropathy, chronic venous insufficiency, h/o left toe amputation 5/2, patient was sent to the ED by her podiatrist Dr. Annamary Rummage with concern of left great toe diabetic foot infection possible osteomyelitis seen on x-ray.  Further workup with MRI confirmed osteomyelitis. MRI left foot confirmed osteomyelitis. Started on broad-spectrum IV antibiotics Admitted to [facility]. 5/3, s/p left partial first ray amputation.  Blood culture on admission as well as wound culture grew MRSA.  She needed pacemaker lead extraction on 5/7. Currently on IV antibiotics per ID  Subjective: Patient was seen and examined this morning.  Lying on bed.  Breathing better than yesterday.  Looks dry.  Urine still looks concentrated.  Chest x-ray shows atelectasis  Assessment and plan: Left diabetic foot infection  Left great toe wound infection with concern for osteomyelitis, POA S/p left partial first ray amputation -5/3 Dr. Ardelle Anton Seen the vascular surgery but revascularization was not needed. Underwent first ray amputation by podiatry.  Per podiatry follow-up today 5/9, dressing to remain clean dry and intact, changes outpatient.  Weightbearing as tolerated to heel in surgical shoe.  MRSA bacteremia Vegetation in pacemaker lead TEE 5/6 shows a vegetation on RV lead.  Underwent lead extraction on 5/7 Repeat blood culture 5/5 and 5/8 has not shown any growth. Currently on IV daptomycin per ID PICC line placed today 5/9.  Paroxysmal A-fib  H/o SSS s/p PPM PTA on Toprol, amiodarone, Eliquis.   Currently in  sinus bradycardia post lead extraction. Continue amiodarone.  Toprol on hold.  Eliquis on hold.  Okay to resume once cleared by EP  CAD/HLD Continue statin.  Anticoagulation plan as above.  Acute on chronic anemia Iron deficiency anemia Baseline hemoglobin above 9.   Hemoglobin was low at 7.7 on 5/4.  Received 1 unit of PRBC.  Hemoglobin stable over 8. Recent Labs    09/05/22 0148 09/06/22 0137 09/08/22 0819 09/09/22 0557 09/10/22 1146  HGB 7.7* 8.2* 8.0* 8.5* 8.8*  MCV 97.6 96.7 96.3 96.0 96.5    AKI on CKD 4 suspect prerenal in the setting of poor oral intake. Baseline creatinine less than 2.  Creatinine trend fluctuating.  Clinically looks dry.  Probably losing fluid because of persistently elevated glucose level.  Currently normal saline at 75 mill per hour.  In the last 24 hours, creatinine slightly improved.  I will continue IV fluid for today.   Recent Labs    02/05/22 1037 02/16/22 1008 09/03/22 1747 09/04/22 0129 09/05/22 0148 09/06/22 0137 09/07/22 0127 09/08/22 0819 09/09/22 0557 09/10/22 1146  BUN 45* 47* 54* 60* 57* 53* 43* 40* 42* 42*  CREATININE 1.86* 2.09* 2.85* 2.94* 3.31* 2.69* 2.14* 1.99* 2.58* 2.39*    Acute on chronic respiratory failure with hypoxia On 3 L oxygen at home.  Respiratory status stable today.  On 3 to 4 L of oxygen. Continue scheduled Breztri inhaler, PRN bronchodilators, Mucinex and incentive spirometry  Chronic diastolic CHF Essential hypertension PTA Toprol, amlodipine, torsemide, losartan Currently on amlodipine only blood pressure elevated to 160s. Toprol on hold because of bradycardia Torsemide and losartan on hold because of AKI. With blood pressure apprising, increase  amlodipine to 10 mg daily.  Type 2 diabetes with hyperglycemia Diabetic polyneuropathy A1c 6.8 on 09/04/2022 Currently on Semglee 25 units, along with moderate SSI/Accu-Cheks.  Blood sugar continues to run elevated.   Diabetes care coordinator consult  appreciated.  Will increase Semglee to 35 units and also add NovoLog 5 mg 3 times daily.   Continue gabapentin Recent Labs  Lab 09/09/22 1156 09/09/22 1614 09/09/22 2123 09/10/22 0614 09/10/22 1156  GLUCAP 294* 282* 258* 265* 349*    Hypothyroidism Synthroid  Obesity  Body mass index is 38.92 kg/m. Patient has been advised to make an attempt to improve diet and exercise patterns to aid in weight loss.  Impaired mobility PT eval obtained.  SNF recommended.  Goals of care   Code Status: DNR    DVT prophylaxis: Heparin scSCDs Start: 09/08/22 1844  Antimicrobials: IV daptomycin per ID.  PICC line placed 5/9 Fluid: None Consultants: Podiatry, vascular surgery, ID, cardiology Family Communication: Husband at bedside  Status: Inpatient Level of care:  Telemetry Surgical   Patient from: Home Anticipated d/c to: Pending clinical course Needs to continue in-hospital care:  Renal function not improving.  If renal function improved and otherwise stable, plan to discharge in next 2 to 3 days.   Diet:  Diet Order             Diet NPO time specified  Diet effective midnight           Diet Heart Room service appropriate? Yes; Fluid consistency: Thin  Diet effective now                   Scheduled Meds:  sodium chloride   Intravenous Once   amiodarone  200 mg Oral Daily   amLODipine  5 mg Oral Daily   atorvastatin  40 mg Oral Daily   Budeson-Glycopyrrol-Formoterol  2 puff Inhalation BID   Chlorhexidine Gluconate Cloth  6 each Topical Daily   vitamin D3  1,000 Units Oral Q lunch   cyanocobalamin  1,000 mcg Oral Q lunch   gabapentin  200 mg Oral TID   guaiFENesin  600 mg Oral BID   insulin aspart  0-15 Units Subcutaneous TID WC   insulin aspart  0-5 Units Subcutaneous QHS   [START ON 09/11/2022] insulin glargine-yfgn  28 Units Subcutaneous Daily   levothyroxine  200 mcg Oral QAC breakfast   sodium chloride flush  10-40 mL Intracatheter Q12H    PRN  meds: acetaminophen, albuterol, guaiFENesin-dextromethorphan, HYDROcodone-acetaminophen, HYDROmorphone (DILAUDID) injection, melatonin, ondansetron (ZOFRAN) IV, phenol, polyethylene glycol, prochlorperazine, sodium chloride flush   Infusions:   sodium chloride 75 mL/hr at 09/10/22 0428   DAPTOmycin (CUBICIN) 600 mg in sodium chloride 0.9 % IVPB Stopped (09/09/22 1655)   sodium chloride      Antimicrobials: Anti-infectives (From admission, onward)    Start     Dose/Rate Route Frequency Ordered Stop   09/08/22 0956  ceFAZolin (ANCEF) 2-4 GM/100ML-% IVPB  Status:  Discontinued       Note to Pharmacy: [facility], [place]: cabinet override      09/08/22 0956 09/08/22 1002   09/08/22 0700  gentamicin (GARAMYCIN) 80 mg in sodium chloride 0.9 % 500 mL irrigation        80 mg Irrigation To Short Stay 09/08/22 0654 09/08/22 1519   09/08/22 0700  ceFAZolin (ANCEF) IVPB 2g/100 mL premix        2 g 200 mL/hr over 30 Minutes Intravenous To Short Stay 09/08/22 0654 09/08/22 1612  09/07/22 1600  DAPTOmycin (CUBICIN) 600 mg in sodium chloride 0.9 % IVPB        8 mg/kg  74.4 kg (Adjusted) 124 mL/hr over 30 Minutes Intravenous Every 48 hours 09/07/22 1317     09/07/22 1000  vancomycin (VANCOREADY) IVPB 1500 mg/300 mL  Status:  Discontinued        1,500 mg 150 mL/hr over 120 Minutes Intravenous every 72 hours 09/04/22 0105 09/05/22 1019   09/06/22 1200  vancomycin (VANCOREADY) IVPB 750 mg/150 mL  Status:  Discontinued        750 mg 150 mL/hr over 60 Minutes Intravenous Every 48 hours 09/05/22 1019 09/07/22 1317   09/04/22 2200  metroNIDAZOLE (FLAGYL) IVPB 500 mg  Status:  Discontinued       See Hyperspace for full Linked Orders Report.   500 mg 100 mL/hr over 60 Minutes Intravenous Every 12 hours 09/04/22 1022 09/07/22 0830   09/04/22 1100  cefTRIAXone (ROCEPHIN) 2 g in sodium chloride 0.9 % 100 mL IVPB  Status:  Discontinued       See Hyperspace for full Linked Orders Report.   2 g 200 mL/hr over 30  Minutes Intravenous Every 24 hours 09/04/22 1022 09/07/22 0830   09/04/22 0200  vancomycin (VANCOREADY) IVPB 2000 mg/400 mL        2,000 mg 200 mL/hr over 120 Minutes Intravenous  Once 09/04/22 0105 09/04/22 0626   09/03/22 2230  cefTRIAXone (ROCEPHIN) 2 g in sodium chloride 0.9 % 100 mL IVPB  Status:  Discontinued       See Hyperspace for full Linked Orders Report.   2 g 200 mL/hr over 30 Minutes Intravenous Every 24 hours 09/03/22 2218 09/04/22 1022   09/03/22 2230  metroNIDAZOLE (FLAGYL) IVPB 500 mg  Status:  Discontinued       See Hyperspace for full Linked Orders Report.   500 mg 100 mL/hr over 60 Minutes Intravenous Every 12 hours 09/03/22 2218 09/04/22 1022       Nutritional status:  Body mass index is 38.92 kg/m.          Objective: Vitals:   09/10/22 0335 09/10/22 0734  BP: (!) 162/52 (!) 167/57  Pulse: (!) 57 68  Resp: 20 16  Temp: 98.3 F (36.8 C) 97.7 F (36.5 C)  SpO2: 99% 99%    Intake/Output Summary (Last 24 hours) at 09/10/2022 1330 Last data filed at 09/10/2022 0600 Gross per 24 hour  Intake 815.87 ml  Output 450 ml  Net 365.87 ml    Filed Weights   09/08/22 0500 09/09/22 0500 09/10/22 0500  Weight: 105.2 kg 108.6 kg 106.1 kg   Weight change: -2.5 kg Body mass index is 38.92 kg/m.   Physical Exam: General exam: Pleasant, elderly Caucasian female.  Not in distress but continues to look dry Skin: No rashes, lesions or ulcers. HEENT: Atraumatic, normocephalic, no obvious bleeding Lungs: Mild diffuse wheezing noted.  Coughs on deep breathing CVS: Regular rate and rhythm, no murmur.  Pacemaker extract site bandaged. GI/Abd soft, nontender, nondistended, bowel present CNS: Alert, awake, oriented x 3 Psychiatry: Mood appropriate Extremities: No pedal edema, no calf tenderness.  Status post left great toe amputation   Data Review: I have personally reviewed the laboratory data and studies available.  F/u labs ordered Unresulted Labs (From  admission, onward)     Start     Ordered   09/11/22 0500  CBC with Differential/Platelet  Daily,   R     Question:  Specimen collection  method  Answer:  Lab=Lab collect   09/10/22 4098   09/11/22 0500  Basic metabolic panel  Daily,   R     Question:  Specimen collection method  Answer:  Lab=Lab collect   09/10/22 0808   09/08/22 0500  CK  Weekly,   R      09/07/22 1317   09/08/22 0425  Surgical PCR screen  (Screening)  Once,   R        09/08/22 0424   09/06/22 0944  Miscellaneous [facility] test (send-out)  Add-on,   AD       Comments: dapto sens on blood cx 5/2   Question:  Test name / description:  Answer:  [id number]   09/06/22 0944            Total time spent in review of labs and imaging, patient evaluation, formulation of plan, documentation and communication with family: 55 minutes  Signed, Lorin Glass, MD [facility] 09/10/2022

## 2022-09-10 NOTE — Evaluation (Signed)
Occupational Therapy Evaluation Patient Details Name: Kayla [name] MRN: [id number] DOB: 03-May-1941 Today's Date: 09/10/2022   History of Present Illness Pt is 82 year old presented to [facility] on  09/03/22 for lt great toe osteomyelitis. Underwent lt great toe amputation on 09/04/22. Pt also found to have MRSA bacteremia. Pt also with rt great toe fx. Pt underwent pacer extraction on 09/08/22. PMH - copd on 3L O2, OSA, pacer, afib, DM, lymphedema, TKA, lt 4th toe amputation   Clinical Impression   This 82 yo female admitted with above presents to acute OT with PLOF of needing some A for LB ADLs, ambulating with a RW, and using 3 liters of O2 at home. She currently is setup-total A for basic ADLs and Mod A-Mod A +2 for all mobility. She will continue to benefit from acute OT with follow up inpatient follow up therapy, <3 hours/day.      Recommendations for follow up therapy are one component of a multi-disciplinary discharge planning process, led by the attending physician.  Recommendations may be updated based on patient status, additional functional criteria and insurance authorization.   Assistance Recommended at Discharge Frequent or constant Supervision/Assistance  Patient can return home with the following Two people to help with walking and/or transfers;Two people to help with bathing/dressing/bathroom;Assistance with cooking/housework;Help with stairs or ramp for entrance;Assist for transportation    Functional Status Assessment  Patient has had a recent decline in their functional status and demonstrates the ability to make significant improvements in function in a reasonable and predictable amount of time.  Equipment Recommendations  Other (comment) (TBD next venue)       Precautions / Restrictions Precautions Precautions: ICD/Pacemaker;Fall Precaution Comments: treat temporary pacer as you would regular pacer per Cardiology (should be taken out 5/10) Required Braces or Orthoses: Other  Brace Other Brace: Bilateral post op shoes Restrictions Weight Bearing Restrictions: Yes LUE Weight Bearing: Non weight bearing (due to temp pacer) RLE Weight Bearing: Weight bearing as tolerated LLE Weight Bearing: Weight bearing as tolerated Other Position/Activity Restrictions: in post op shoes      Mobility Bed Mobility Overal bed mobility: Needs Assistance Bed Mobility: Supine to Sit     Supine to sit: Mod assist     General bed mobility comments: VCs for hand placement, partial A for trunk up and A for coming around to get feet on floor    Transfers Overall transfer level: Needs assistance Equipment used: Rolling walker (2 wheels) Transfers: Sit to/from Stand, Bed to chair/wheelchair/BSC Sit to Stand: Min assist, Mod assist, +2 physical assistance, +2 safety/equipment, From elevated surface Stand pivot transfers: Min assist, +2 physical assistance         General transfer comment: Pt was Mod A +2 from regular height bed and VCs for hand placement; Min A +2 from raised bed and VC's for hand placement. With transfer pt was min A +2 A and safety and sat down in recliner without puttng hands back to reach for it due to anxiousness over pain      Balance Overall balance assessment: Needs assistance Sitting-balance support: No upper extremity supported, Feet supported Sitting balance-Leahy Scale: Fair     Standing balance support: Bilateral upper extremity supported, Reliant on assistive device for balance Standing balance-Leahy Scale: Poor                             ADL either performed or assessed with clinical judgement   ADL  Overall ADL's : Needs assistance/impaired Eating/Feeding: Set up Eating/Feeding Details (indicate cue type and reason): supported sitting Grooming: Set up Grooming Details (indicate cue type and reason): supported sitting Upper Body Bathing: Moderate assistance Upper Body Bathing Details (indicate cue type and reason):  supported sitting Lower Body Bathing: Total assistance Lower Body Bathing Details (indicate cue type and reason): Mod A +2 sit<>stand and maintain Upper Body Dressing : Minimal assistance Upper Body Dressing Details (indicate cue type and reason): supported sitting Lower Body Dressing: Total assistance Lower Body Dressing Details (indicate cue type and reason): Mod A +2 sit<>stand and maintain Toilet Transfer: Minimal assistance;+2 for physical assistance;+2 for safety/equipment;Stand-pivot;Rolling walker (2 wheels) Toilet Transfer Details (indicate cue type and reason): simulated bed>recliner next to be bed, going to patient's left Toileting- Clothing Manipulation and Hygiene: Total assistance Toileting - Clothing Manipulation Details (indicate cue type and reason): Mod A +2 sit<>stand and maintain             Vision Patient Visual Report: No change from baseline              Pertinent Vitals/Pain Pain Assessment Pain Assessment: 0-10 Pain Score: 8  Pain Location: generalized post getting up to recliner; in standing she reports it is Bil knees and hips Pain Descriptors / Indicators: Grimacing, Guarding, Moaning Pain Intervention(s): Limited activity within patient's tolerance, Monitored during session, Repositioned, Patient requesting pain meds-RN notified, RN gave pain meds during session     Hand Dominance Right   Extremity/Trunk Assessment Upper Extremity Assessment Upper Extremity Assessment: Generalized weakness           Communication Communication Communication: No difficulties   Cognition Arousal/Alertness: Awake/alert Behavior During Therapy: WFL for tasks assessed/performed Overall Cognitive Status: Within Functional Limits for tasks assessed                                 General Comments: Pt needing VCs to adhere to pacer precautions     General Comments  VSS on 3 liters; pt was educated on use of inspirometer and returned demo x3 with  encouragement to do 5-10 reps each hour she is awake.            Home Living Family/patient expects to be discharged to:: Skilled nursing facility Living Arrangements: Spouse/significant other Available Help at Discharge: Family;Available 24 hours/day Type of Home: House Home Access: Ramped entrance     Home Layout: One level     Bathroom Shower/Tub: Chief Strategy Officer: Handicapped height     Home Equipment: Rollator (4 wheels);Rolling Walker (2 wheels);Cane - single point;Shower seat (home O2 3 liters)          Prior Functioning/Environment Prior Level of Function : Needs assist       Physical Assist : ADLs (physical)   ADLs (physical): IADLs;Dressing;Bathing Mobility Comments: amb modified independent with walker. ADLs Comments: Husband assists with donning socks and shoes as well as tub transfers. husband performs majority of IADLs        OT Problem List: Decreased activity tolerance;Impaired balance (sitting and/or standing);Decreased knowledge of use of DME or AE;Decreased knowledge of precautions;Obesity;Pain      OT Treatment/Interventions: Self-care/ADL training;DME and/or AE instruction;Patient/family education;Balance training    OT Goals(Current goals can be found in the care plan section) Acute Rehab OT Goals Patient Stated Goal: was hopeful to be able to go home from here, but once started moving she realized how  weak she is and needs to go to rehab. OT Goal Formulation: With patient/family Time For Goal Achievement: 09/24/22 Potential to Achieve Goals: Good  OT Frequency: Min 1X/week    Co-evaluation   Reason for Co-Treatment: Complexity of the patient's impairments (multi-system involvement);For patient/therapist safety;To address functional/ADL transfers PT goals addressed during session: Mobility/safety with mobility;Balance;Proper use of DME;Strengthening/ROM        AM-PAC OT "6 Clicks" Daily Activity     Outcome Measure  Help from another person eating meals?: None Help from another person taking care of personal grooming?: A Little Help from another person toileting, which includes using toliet, bedpan, or urinal?: A Lot Help from another person bathing (including washing, rinsing, drying)?: A Lot Help from another person to put on and taking off regular upper body clothing?: A Little Help from another person to put on and taking off regular lower body clothing?: Total 6 Click Score: 15   End of Session Equipment Utilized During Treatment: Gait belt;Rolling walker (2 wheels);Oxygen (3 liters) Nurse Communication: Mobility status (+2 with RW)  Activity Tolerance: Patient limited by pain Patient left: in chair;with call bell/phone within reach;with family/visitor present (husband staying until 6:00pm tonight so no chair alarm placed in recliner)  OT Visit Diagnosis: Unsteadiness on feet (R26.81);Other abnormalities of gait and mobility (R26.89);Muscle weakness (generalized) (M62.81);History of falling (Z91.81);Pain Pain - Right/Left:  (generalized)                Time: 1610-9604 OT Time Calculation (min): 31 min Charges:  OT General Charges $OT Visit: 1 Visit OT Evaluation $OT Eval Moderate Complexity: 1 Mod  Cathy L. OT Acute Rehabilitation Services Office [phone]    Evette Georges 09/10/2022, 2:04 PM

## 2022-09-10 NOTE — Telephone Encounter (Signed)
Patient seen today, she was unavailable in the Cath Lab and echo lab previously this week when I rounded

## 2022-09-10 NOTE — Progress Notes (Signed)
Physical Therapy Treatment Patient Details Name: Kayla [name] MRN: [id number] DOB: 01-08-1941 Today's Date: 09/10/2022   History of Present Illness Pt is 82 year old presented to [facility] on  09/03/22 for lt great toe osteomyelitis. Underwent lt great toe amputation on 09/04/22. Pt also found to have MRSA bacteremia. Pt also with rt great toe fx. Pt underwent pacer extraction on 09/08/22. PMH - copd on 3L O2, OSA, pacer, afib, DM, lymphedema, TKA, lt 4th toe amputation    PT Comments    Pt received in supine, agreeable to therapy session with encouragement, pt c/o fatigue and minimal pain at rest but severe c/o pain with seated/standing activity. Pt needing up to +1 modA for bed mobility and +2 modA for seated scooting and standing transfers from lower surface height<>RW. Pt needing increased time/effort to initiate and perform all functional tasks due to pain, dyspnea, and c/o weakness. HR/SpO2 WFL on baseline 3L O2 [place] (pt weaned from 4L to 3L during session). Pt performed supine/seated BLE exercises and pulling ~746mL on IS, encouraged hourly use. Pt and spouse report they are agreeable to short term post-acute lower intensity rehab <3 hours/day upon DC as pt currently needing +2 physical assist for safety and she will not have this level of physical assist at home. Pt continues to benefit from PT services to progress toward functional mobility goals.   Recommendations for follow up therapy are one component of a multi-disciplinary discharge planning process, led by the attending physician.  Recommendations may be updated based on patient status, additional functional criteria and insurance authorization.  Follow Up Recommendations  Can patient physically be transported by private vehicle: No    Assistance Recommended at Discharge Frequent or constant Supervision/Assistance  Patient can return home with the following A lot of help with bathing/dressing/bathroom;Assist for transportation;Help with  stairs or ramp for entrance;Assistance with cooking/housework;Two people to help with walking and/or transfers   Equipment Recommendations  Wheelchair (measurements PT);Wheelchair cushion (measurements PT)    Recommendations for Other Services       Precautions / Restrictions Precautions Precautions: ICD/Pacemaker;Fall Precaution Comments: treat temporary pacer as you would regular pacer per Cardiology (should be taken out 5/10) Required Braces or Orthoses: Other Brace Other Brace: Bilateral post op shoes Restrictions Weight Bearing Restrictions: Yes LUE Weight Bearing: No pushing/pulling (due to temp pacer) can WB on RW for transfers RLE Weight Bearing: Weight bearing as tolerated LLE Weight Bearing: Weight bearing as tolerated Other Position/Activity Restrictions: in post op shoes     Mobility  Bed Mobility Overal bed mobility: Needs Assistance Bed Mobility: Supine to Sit     Supine to sit: Mod assist, HOB elevated     General bed mobility comments: Assist to elevate trunk and bring hips to EOB. ModA +1-2 to advance her hips to foot flat, increased time and frequent rest breaks to get to full upright posture due to fatigue/pain/dyspnea.    Transfers Overall transfer level: Needs assistance Equipment used: Rolling walker (2 wheels), 1 person hand held assist Transfers: Sit to/from Stand, Bed to chair/wheelchair/BSC Sit to Stand: Mod assist, +2 physical assistance, From elevated surface, Min assist   Step pivot transfers: Min assist, +2 safety/equipment, From elevated surface       General transfer comment: Assist to bring hips up and for balance. Pt using RUE to push from EOB and LUE on RW to prevent heavy pushing/pulling. Initially pt requiring modA from lower bed height but pt had to sit back down due to severe c/o  pain. On second trial, pt stood with minA +2 from elevated bed height surface. Dense cues for safe UE placement and RW mgmt.    Ambulation/Gait                General Gait Details: Pain too limiting to do more than x2 pivotal steps from bed>chair on her L side.   Stairs             Wheelchair Mobility    Modified Rankin (Stroke Patients Only)       Balance Overall balance assessment: Needs assistance Sitting-balance support: Single extremity supported, Feet supported Sitting balance-Leahy Scale: Poor Sitting balance - Comments: Prefers UE suppport   Standing balance support: Single extremity supported, During functional activity Standing balance-Leahy Scale: Poor Standing balance comment: BUE support and min/mod assist for static  and a bit of dynamic standing                            Cognition Arousal/Alertness: Awake/alert Behavior During Therapy: WFL for tasks assessed/performed Overall Cognitive Status: Within Functional Limits for tasks assessed                                 General Comments: Pt needs min cues frequently not to push/pull through LUE given pacer wires still present; mildly impulsive with movements due to pain but follows most cues, some delay at times. Spouse present and encouraging, he appears HoH and states neither he nor children who live nearby can give her a heavy boost for mobility.        Exercises General Exercises - Lower Extremity Ankle Circles/Pumps: AROM, Both, 10 reps, Supine Quad Sets: AROM, Both, 5 reps, Supine Gluteal Sets: AROM, 5 reps, Supine Long Arc Quad: AROM, Both, 5 reps, Seated Other Exercises Other Exercises: IS x 5 reps (achieves ~750 mL)    General Comments General comments (skin integrity, edema, etc.): VSS on 3 liters; pt was educated on use of inspirometer and returned demo x3 with encouragement to do 5-10 reps each hour she is awake.      Pertinent Vitals/Pain Pain Assessment Pain Assessment: 0-10 Pain Score: 8  Pain Location: BLE (knees, hips, incision sites, generalized "all over" Pain Descriptors / Indicators: Grimacing,  Guarding, Discomfort Pain Intervention(s): Limited activity within patient's tolerance, Monitored during session, Repositioned, Patient requesting pain meds-RN notified    Home Living Family/patient expects to be discharged to:: Skilled nursing facility Living Arrangements: Spouse/significant other (spouse states he cannot provide heavy lift assist for her) Available Help at Discharge: Family;Available 24 hours/day Type of Home: House Home Access: Ramped entrance       Home Layout: One level Home Equipment: Rollator (4 wheels);Rolling Walker (2 wheels);Cane - single point;Shower seat (home O2 3 liters)      Prior Function            PT Goals (current goals can now be found in the care plan section) Acute Rehab PT Goals Patient Stated Goal: To get stronger at post-acute rehab facility and less pain prior to going home PT Goal Formulation: With patient/family Time For Goal Achievement: 09/23/22 Progress towards PT goals: Progressing toward goals    Frequency    Min 1X/week      PT Plan Current plan remains appropriate    Co-evaluation PT/OT/SLP Co-Evaluation/Treatment: Yes Reason for Co-Treatment: Complexity of the patient's impairments (multi-system involvement);For patient/therapist safety;To address functional/ADL  transfers PT goals addressed during session: Mobility/safety with mobility;Balance;Proper use of DME;Strengthening/ROM        AM-PAC PT "6 Clicks" Mobility   Outcome Measure  Help needed turning from your back to your side while in a flat bed without using bedrails?: A Little Help needed moving from lying on your back to sitting on the side of a flat bed without using bedrails?: A Lot (with no rail) Help needed moving to and from a bed to a chair (including a wheelchair)?: A Lot Help needed standing up from a chair using your arms (e.g., wheelchair or bedside chair)?: A Lot Help needed to walk in hospital room?: Total Help needed climbing 3-5 steps with  a railing? : Total 6 Click Score: 11    End of Session Equipment Utilized During Treatment: Oxygen;Other (comment);Gait belt (bilateral post op shoes) Activity Tolerance: Patient tolerated treatment well;Patient limited by pain Patient left: with call bell/phone within reach;with family/visitor present;in chair (spouse will be in room until 6pm so no chair alarm set as she will be back to bed prior to him leaving.) Nurse Communication: Mobility status;Other (comment);Patient requests pain meds (use +2 and RW for pivotal transfer from chair>bed; defer Stedy given new RUE picc line and LUE no pushing/pulling restriction?) PT Visit Diagnosis: Unsteadiness on feet (R26.81);Other abnormalities of gait and mobility (R26.89);Difficulty in walking, not elsewhere classified (R26.2)     Time: 2956-2130 PT Time Calculation (min) (ACUTE ONLY): 32 min  Charges:  $Therapeutic Exercise: 8-22 mins                     Aparna Vanderweele P., PTA Acute Rehabilitation Services Secure Chat Preferred 9a-5:30pm Office: [phone]    Dorathy Kinsman [facility] 09/10/2022, 2:02 PM

## 2022-09-10 NOTE — Progress Notes (Signed)
Peripherally Inserted Central Catheter Placement  The IV Nurse has discussed with the patient and/or persons authorized to consent for the patient, the purpose of this procedure and the potential benefits and risks involved with this procedure.  The benefits include less needle sticks, lab draws from the catheter, and the patient may be discharged home with the catheter. Risks include, but not limited to, infection, bleeding, blood clot (thrombus formation), and puncture of an artery; nerve damage and irregular heartbeat and possibility to perform a PICC exchange if needed/ordered by physician.  Alternatives to this procedure were also discussed.  Bard Power PICC patient education guide, fact sheet on infection prevention and patient information card has been provided to patient /or left at bedside.    PICC Placement Documentation  PICC Single Lumen 09/10/22 Right Brachial 37 cm 0 cm (Active)  Indication for Insertion or Continuance of Line Prolonged intravenous therapies 09/10/22 1111  Exposed Catheter (cm) 0 cm 09/10/22 1111  Site Assessment Clean, Dry, Intact 09/10/22 1111  Line Status Flushed;Saline locked;Blood return noted 09/10/22 1111  Dressing Type Transparent;Securing device 09/10/22 1111  Dressing Status Antimicrobial disc in place;Clean, Dry, Intact 09/10/22 1111  Safety Lock Not Applicable 09/10/22 1111  Line Care Connections checked and tightened 09/10/22 1111  Line Adjustment (NICU/IV Team Only) No 09/10/22 1111  Dressing Intervention New dressing;Other (Comment) 09/10/22 1111  Dressing Change Due 09/17/22 09/10/22 1111    Patient's R arm from the [facility] medial, distal and lower FA swollen from PIV infiltration prior to PICC insertion.   Annett Fabian 09/10/2022, 11:13 AM

## 2022-09-10 NOTE — Progress Notes (Signed)
  Subjective:  Patient ID: Kayla [name], female    DOB: [birth year],  MRN: [id number]  Patient seen at bedside resting comfortably, husband present as well.  She underwent removal of pacemaker lead due to vegetations, she thinks she will be going to a rehab facility  Negative for chest pain and shortness of breath Fever: no Constitutional signs: no Review of all other systems is negative  Objective:   Vitals:   09/10/22 0335 09/10/22 0734  BP: (!) 162/52 (!) 167/57  Pulse: (!) 57 68  Resp: 20 16  Temp: 98.3 F (36.8 C) 97.7 F (36.5 C)  SpO2: 99% 99%   General AA&O x3. Normal mood and affect.  Vascular Foot is warm well-perfused, pulses palpable.  Capillary fill time intact to remaining digits  Neurologic Epicritic sensation grossly reduced.  Dermatologic Dressing clean dry and intact, incisions healing well, no maceration  Orthopedic: Muscle function intact    Assessment & Plan:  Patient was evaluated and treated and all questions answered.  POD number 5 left partial first ray amputation -Dressing to remain clean dry and intact.  Does not need changes until outpatient follow-up -Weightbearing as tolerated to heel in surgical shoe. -Office will schedule outpatient follow-up next week with [place] -Will sign off at this point.  Please call if questions or concerns  Edwin Cap, DPM  Accessible via secure chat for questions or concerns.

## 2022-09-10 NOTE — TOC Progression Note (Signed)
Transition of Care [facility]) - Progression Note    Patient Details  Name: Kayla [name] MRN: [id number] Date of Birth: 12-22-1940  Transition [facility]) CM/SW Contact  Erin Sons, [place] Phone Number: 09/10/2022, 1:02 PM  Clinical Narrative:     CSW spoke with [facility] liaison regarding SNF referral. They would like to see how pt does with therapies today prior to making decision on bed offer. CSW will send updated therapy notes once available.   Expected Discharge Plan: Home w Home Health Services Barriers to Discharge: Continued Medical Work up  Expected Discharge Plan and Services   Discharge Planning Services: CM Consult                       DME Agency: Other - Comment ([facility]) Date DME Agency Contacted: 09/07/22 Time DME Agency Contacted: 1357 Representative spoke with at DME Agency: Pam HH Arranged: RN HH Agency: [facility] Date [facility] Agency Contacted: 09/07/22 Time HH Agency Contacted: 1358 Representative spoke with at [facility] Agency: Kandee Keen   Social Determinants of Health (SDOH) Interventions SDOH Screenings   Food Insecurity: No Food Insecurity (01/20/2022)  Housing: Low Risk  (01/20/2022)  Transportation Needs: No Transportation Needs (01/20/2022)  Utilities: Not At Risk (01/20/2022)  Alcohol Screen: Low Risk  (01/31/2021)  Depression (PHQ2-9): Low Risk  (09/30/2021)  Recent Concern: Depression (PHQ2-9) - Medium Risk (08/18/2021)  Financial Resource Strain: Low Risk  (01/31/2021)  Physical Activity: Insufficiently Active (09/30/2021)  Social Connections: Socially Integrated (09/30/2021)  Stress: No Stress Concern Present (09/30/2021)  Tobacco Use: Medium Risk (09/08/2022)    Readmission Risk Interventions    01/22/2022    2:43 PM 07/23/2021   12:10 PM 03/21/2021   12:53 PM  Readmission Risk Prevention Plan  Transportation Screening Complete Complete Complete  PCP or Specialist Appt within 3-5 Days Complete    HRI or Home Care Consult  Complete    Social Work Consult for Recovery Care Planning/Counseling Complete    Palliative Care Screening Not Applicable    Medication Review Oceanographer) Complete Complete Complete  HRI or Home Care Consult  Complete   SW Recovery Care/Counseling Consult  Complete   Palliative Care Screening  Not Applicable   Skilled Nursing Facility  Complete

## 2022-09-10 NOTE — Telephone Encounter (Signed)
Patient husband call this morning concerned that the patient dressing had not been changed since Sunday when Dr. Ardelle Anton rounded on this patient. Patient husband was looking for someone to come change the dressing as he is concerned.

## 2022-09-10 NOTE — Progress Notes (Addendum)
Patient Name: Kayla [name] Date of Encounter: 09/10/2022  Primary Cardiologist: Donato Schultz, MD Electrophysiologist: Sherryl Manges, MD  Interval Summary   Feeling OK this am. Husband is worried what could occur without a pacemaker in  Inpatient Medications    Scheduled Meds:  sodium chloride   Intravenous Once   amiodarone  200 mg Oral Daily   amLODipine  5 mg Oral Daily   atorvastatin  40 mg Oral Daily   Budeson-Glycopyrrol-Formoterol  2 puff Inhalation BID   vitamin D3  1,000 Units Oral Q lunch   cyanocobalamin  1,000 mcg Oral Q lunch   gabapentin  200 mg Oral TID   guaiFENesin  600 mg Oral BID   insulin aspart  0-15 Units Subcutaneous TID WC   insulin aspart  0-5 Units Subcutaneous QHS   [START ON 09/11/2022] insulin glargine-yfgn  28 Units Subcutaneous Daily   levothyroxine  200 mcg Oral QAC breakfast   Continuous Infusions:  sodium chloride 75 mL/hr at 09/10/22 0428   DAPTOmycin (CUBICIN) 600 mg in sodium chloride 0.9 % IVPB Stopped (09/09/22 1655)   sodium chloride     PRN Meds: acetaminophen, albuterol, guaiFENesin-dextromethorphan, HYDROcodone-acetaminophen, HYDROmorphone (DILAUDID) injection, melatonin, ondansetron (ZOFRAN) IV, phenol, polyethylene glycol, prochlorperazine   Vital Signs    Vitals:   09/09/22 2347 09/10/22 0335 09/10/22 0500 09/10/22 0734  BP: (!) 136/46 (!) 162/52  (!) 167/57  Pulse: (!) 53 (!) 57  68  Resp: 18 20  16   Temp: 97.7 F (36.5 C) 98.3 F (36.8 C)  97.7 F (36.5 C)  TempSrc: Axillary Oral  Oral  SpO2: 90% 99%  99%  Weight:   106.1 kg   Height:        Intake/Output Summary (Last 24 hours) at 09/10/2022 0955 Last data filed at 09/10/2022 0600 Gross per 24 hour  Intake 1055.87 ml  Output 450 ml  Net 605.87 ml   Filed Weights   09/08/22 0500 09/09/22 0500 09/10/22 0500  Weight: 105.2 kg 108.6 kg 106.1 kg    Physical Exam    GEN- The patient is well appearing, alert and oriented x 3 today.   Lungs- Clear to ausculation  bilaterally, normal work of breathing Cardiac- Regular rate and rhythm, no murmurs, rubs or gallops GI- soft, NT, ND, + BS Extremities- no clubbing or cyanosis. No edema  Telemetry    Sinus bradycardia 50-60s (personally reviewed)  Hospital Course    Kayla [name] is a 82 y.o. female admitted for suspected CIED associated bacteremia at the request of Dr. Pola Corn. [name], diabetic polyneuropathy, sick sinus syndrome post permanent pacemaker implant, hyperlipidemia, obesity, chronic venous insufficiency, COPD, gout, iron deficiency anemia, hypertension, paroxysmal atrial fibrillation on Eliquis. She was admitted with left great toe diabetic foot infection and has recently undergone amputation of her great toe. During the hospitalization blood cultures have returned positive (2 of 2) for MRSA.   Assessment & Plan    CEID Associated Infection in setting of MRSA Bacteremia  2/2 BC positive for MRSA TEE 5/6 with RV lead vegetation at level of RA  -abx per ID    SSS/SND Atrially dependent prior to temp pacing wire placement, lead extraction on 5/6, post procedure VVI 30  Thus far has not needed pacing Tentatively plan to remove temp-perm tomorrow.    PAF Currently SB/SR Holding eliquis.  Avoid AV nodal agents.  Continue amiodarone    Anemia  -Hgb stable, monitor trend  -per [facility]  AKI on CKD  Baseline Cr ~ 1.8-2.5  -per [facility] Labs pending today.   For questions or updates, please contact [facility] Please consult [email or web address] for contact info under Cardiology/STEMI.  Signed, Graciella Freer, PA-C  09/10/2022, 9:55 AM   EP Attending  Patient seen and examined. Agree with the findings as noted above. The patient is a little better today. She is not pacing and her HR is 60 in NSR. Her breathing is improved and her exam demonstrates minimal wheezing. I will plan to remove her temp-perm PM lead tomorrow.  Sharlot Gowda  Aneisa Karren,MD

## 2022-09-10 NOTE — Inpatient Diabetes Management (Signed)
Inpatient Diabetes Program Recommendations  AACE/ADA: New Consensus Statement on Inpatient Glycemic Control (2015)  Target Ranges:  Prepandial:   less than 140 mg/dL      Peak postprandial:   less than 180 mg/dL (1-2 hours)      Critically ill patients:  140 - 180 mg/dL   Lab Results  Component Value Date   GLUCAP 349 (H) 09/10/2022   HGBA1C 6.8 (H) 09/04/2022    Review of Glycemic Control  Latest Reference Range & Units 09/09/22 06:18 09/09/22 11:56 09/09/22 16:14 09/09/22 21:23 09/10/22 06:14 09/10/22 11:56  Glucose-Capillary 70 - 99 mg/dL 784 (H) 696 (H) 295 (H) 258 (H) 265 (H) 349 (H)   Diabetes history: DM 2 Outpatient Diabetes medications: Lantus 55 units daily, Novolog 10-17 units SS as needed  Current orders for Inpatient glycemic control:  Semglee 28 units Daily Novolog 0-15 units tid + hs  Note: Semglee dose increased today. Trends in the mid 200-300 range.  Inpatient Diabetes Program Recommendations:    -   Consider increasing Semglee to 35 units -   Add Novolog 5 units tid meal coverage if eating >50% of meals  Thanks, Christena Deem RN, MSN, BC-ADM Inpatient Diabetes Coordinator Team Pager [phone] (8a-5p)

## 2022-09-11 ENCOUNTER — Inpatient Hospital Stay (HOSPITAL_COMMUNITY): Payer: Medicare HMO

## 2022-09-11 ENCOUNTER — Other Ambulatory Visit: Payer: Medicare HMO

## 2022-09-11 ENCOUNTER — Encounter (HOSPITAL_COMMUNITY): Payer: Self-pay | Admitting: Internal Medicine

## 2022-09-11 ENCOUNTER — Encounter (HOSPITAL_COMMUNITY)
Admission: EM | Disposition: A | Discharge status: Skilled Nursing Facility | Payer: Self-pay | Source: Ambulatory Visit | Attending: Internal Medicine

## 2022-09-11 DIAGNOSIS — R001 Bradycardia, unspecified: Secondary | ICD-10-CM

## 2022-09-11 DIAGNOSIS — E11628 Type 2 diabetes mellitus with other skin complications: Secondary | ICD-10-CM | POA: Diagnosis not present

## 2022-09-11 DIAGNOSIS — L089 Local infection of the skin and subcutaneous tissue, unspecified: Secondary | ICD-10-CM | POA: Diagnosis not present

## 2022-09-11 HISTORY — PX: LEAD EXTRACTION: EP1211

## 2022-09-11 LAB — GLUCOSE, CAPILLARY
Glucose-Capillary: 221 mg/dL — ABNORMAL HIGH (ref 70–99)
Glucose-Capillary: 214 mg/dL — ABNORMAL HIGH (ref 70–99)
Glucose-Capillary: 156 mg/dL — ABNORMAL HIGH (ref 70–99)
Glucose-Capillary: 222 mg/dL — ABNORMAL HIGH (ref 70–99)

## 2022-09-11 LAB — CULTURE, BLOOD (ROUTINE X 2): Culture: NO GROWTH

## 2022-09-11 LAB — BASIC METABOLIC PANEL
Anion gap: 9 (ref 5–15)
BUN: 45 mg/dL — ABNORMAL HIGH (ref 8–23)
CO2: 24 mmol/L (ref 22–32)
Calcium: 8.8 mg/dL — ABNORMAL LOW (ref 8.9–10.3)
Chloride: 101 mmol/L (ref 98–111)
Creatinine, Ser: 2.34 mg/dL — ABNORMAL HIGH (ref 0.44–1.00)
GFR, Estimated: 20 mL/min — ABNORMAL LOW (ref >60)
Glucose, Bld: 245 mg/dL — ABNORMAL HIGH (ref 70–99)
Potassium: 4.4 mmol/L (ref 3.5–5.1)
Sodium: 134 mmol/L — ABNORMAL LOW (ref 135–145)

## 2022-09-11 LAB — CBC WITH DIFFERENTIAL/PLATELET
Abs Immature Granulocytes: 0.29 10*3/uL — ABNORMAL HIGH (ref 0.00–0.07)
Basophils Absolute: 0.1 10*3/uL (ref 0.0–0.1)
Basophils Relative: 0 %
Eosinophils Absolute: 0.1 10*3/uL (ref 0.0–0.5)
Eosinophils Relative: 1 %
HCT: 25.9 % — ABNORMAL LOW (ref 36.0–46.0)
Hemoglobin: 8.4 g/dL — ABNORMAL LOW (ref 12.0–15.0)
Immature Granulocytes: 2 %
Lymphocytes Relative: 5 %
Lymphs Abs: 0.9 10*3/uL (ref 0.7–4.0)
MCH: 30.9 pg (ref 26.0–34.0)
MCHC: 32.4 g/dL (ref 30.0–36.0)
MCV: 95.2 fL (ref 80.0–100.0)
Monocytes Absolute: 1.4 10*3/uL — ABNORMAL HIGH (ref 0.1–1.0)
Monocytes Relative: 8 %
Neutro Abs: 14.7 10*3/uL — ABNORMAL HIGH (ref 1.7–7.7)
Neutrophils Relative %: 84 %
Platelets: 398 10*3/uL (ref 150–400)
RBC: 2.72 MIL/uL — ABNORMAL LOW (ref 3.87–5.11)
RDW: 14.6 % (ref 11.5–15.5)
WBC: 17.4 10*3/uL — ABNORMAL HIGH (ref 4.0–10.5)
nRBC: 0.1 % (ref 0.0–0.2)

## 2022-09-11 LAB — MISC LABCORP TEST (SEND OUT): Labcorp test code: 96388

## 2022-09-11 SURGERY — LEAD EXTRACTION
Anesthesia: LOCAL

## 2022-09-11 MED ORDER — CHLORHEXIDINE GLUCONATE 4 % EX SOLN
60.0000 mL | Freq: Once | CUTANEOUS | Status: AC
  Administered 2022-09-11: 4 via TOPICAL

## 2022-09-11 MED ORDER — CHLORHEXIDINE GLUCONATE 4 % EX SOLN
CUTANEOUS | Status: AC
  Filled 2022-09-11: qty 15

## 2022-09-11 MED ORDER — MUPIROCIN 2 % EX OINT
TOPICAL_OINTMENT | Freq: Two times a day (BID) | CUTANEOUS | Status: DC
  Administered 2022-09-14: 1 via NASAL
  Filled 2022-09-11: qty 22

## 2022-09-11 MED ORDER — CHLORHEXIDINE GLUCONATE 4 % EX SOLN
60.0000 mL | Freq: Once | CUTANEOUS | Status: AC
  Administered 2022-09-11: 4 via TOPICAL
  Filled 2022-09-11: qty 45

## 2022-09-11 MED ORDER — ACETAMINOPHEN 325 MG PO TABS
325.0000 mg | ORAL_TABLET | ORAL | Status: DC | PRN
  Administered 2022-09-12: 650 mg via ORAL
  Filled 2022-09-11: qty 2

## 2022-09-11 MED ORDER — CEFAZOLIN SODIUM-DEXTROSE 2-4 GM/100ML-% IV SOLN
INTRAVENOUS | Status: AC
  Filled 2022-09-11: qty 100

## 2022-09-11 MED ORDER — FUROSEMIDE 10 MG/ML IJ SOLN
40.0000 mg | Freq: Once | INTRAMUSCULAR | Status: AC
  Administered 2022-09-11: 40 mg via INTRAVENOUS

## 2022-09-11 MED ORDER — ONDANSETRON HCL 4 MG/2ML IJ SOLN: 4.0000 mg | Freq: Four times a day (QID) | INTRAMUSCULAR | Status: DC | PRN

## 2022-09-11 MED ORDER — SODIUM CHLORIDE 0.9 % IV SOLN
80.0000 mg | INTRAVENOUS | Status: DC
  Filled 2022-09-11: qty 2

## 2022-09-11 MED ORDER — SODIUM CHLORIDE 0.9 % IV SOLN: INTRAVENOUS | Status: DC

## 2022-09-11 MED ORDER — SODIUM CHLORIDE 0.9 % IV SOLN
INTRAVENOUS | Status: AC
  Filled 2022-09-11: qty 2

## 2022-09-11 MED ORDER — FUROSEMIDE 10 MG/ML IJ SOLN
INTRAMUSCULAR | Status: AC
  Filled 2022-09-11: qty 4

## 2022-09-11 MED ORDER — CEFAZOLIN SODIUM-DEXTROSE 2-4 GM/100ML-% IV SOLN: 2.0000 g | INTRAVENOUS | Status: DC

## 2022-09-11 SURGICAL SUPPLY — 5 items
CABLE SURGICAL S-101-97-12 (CABLE) IMPLANT
FELT TEFLON 1X6 (MISCELLANEOUS) ×1 IMPLANT
PACK EP LATEX FREE (CUSTOM PROCEDURE TRAY) ×1
PACK EP LF (CUSTOM PROCEDURE TRAY) IMPLANT
PAD DEFIB RADIO PHYSIO CONN (PAD) IMPLANT

## 2022-09-11 NOTE — Progress Notes (Signed)
Called to pt's room by NT, pt was having trouble breathing. When entered the room pt was struggling to breath, RR 24-26, belly breathing. 02 sat 93% in 3l 02 via [place]. O2 increased to 4L, sat 100%. Prn breathing treatment administered. Pt was still having trouble breathing. IV fluid stopped. Lung sound diminished. respiratory paged. RT came by. Pt got little better but not much, pt was doing IS by herself. RT suggested ABG and Bipap if needed.  DR. Pola Corn notified, received order for stat chest x-ray. Pt home breathing treatment administered early per MD order. MD wanted fluid stopped, which was already done. Patient slightly better. MD said he will come see pt shortly. Plan of care continues.

## 2022-09-11 NOTE — Progress Notes (Signed)
PROGRESS NOTE  Kayla [name]  DOB: 18-Jan-1941  PCP: Ailene Ravel, MD ZOX:[id number]  DOA: 09/03/2022  LOS: 8 days  Hospital Day: 9  Brief narrative: Kayla [name] is a 82 y.o. female with PMH significant for obesity, OSA, DM2, HTN, HLD, CAD, CHF, paroxysmal A-fib on Eliquis, SSS s/p PPM, carotid artery disease, CKD, chronic anemia, COPD, anxiety/depression, GERD, hypothyroidism, diabetic polyneuropathy, chronic venous insufficiency, h/o left toe amputation 5/2, patient was sent to the ED by her podiatrist Dr. Annamary Rummage with concern of left great toe diabetic foot infection possible osteomyelitis seen on x-ray.  Further workup with MRI confirmed osteomyelitis. MRI left foot confirmed osteomyelitis. Started on broad-spectrum IV antibiotics Admitted to [facility]. 5/3, s/p left partial first ray amputation.  Blood culture on admission as well as wound culture grew MRSA.  She needed pacemaker lead extraction on 5/7. Currently on IV antibiotics per ID  Hospital course is complicated by rising creatinine for which she was started on IV fluid and now she is getting short of breath with chest x-ray suggestive of pulm edema.  Subjective: Patient was seen and examined this morning.  Lying down in bed.  It seems she had a rough night with episodes of dyspnea.  IV fluid was held.  Chest x-ray this morning suggested pulm edema. At the time of my evaluation, patient was on CPAP Husband at bedside. It seems 1 dose of IV Lasix 40 mg was ordered by cardiology earlier.  Assessment and plan: Left diabetic foot infection  Left great toe wound infection with concern for osteomyelitis, POA S/p left partial first ray amputation -5/3 Dr. Ardelle Anton Seen the vascular surgery but revascularization was not needed. Underwent first ray amputation by podiatry.  Per podiatry follow-up today 5/9, dressing to remain clean dry and intact, changes outpatient.  Weightbearing as tolerated to heel in surgical  shoe.  MRSA bacteremia Vegetation in pacemaker lead TEE 5/6 shows a vegetation on RV lead.  Underwent lead extraction on 5/7 Repeat blood culture 5/5 and 5/8 has not shown any growth. Currently on IV daptomycin per ID PICC line placed today 5/9.  Paroxysmal A-fib  H/o SSS s/p PPM PTA on Toprol, amiodarone, Eliquis.   Currently in sinus bradycardia post lead extraction. Continue amiodarone.  Toprol on hold.  Eliquis on hold.  Okay to resume once cleared by EP  AKI on CKD 4 Acute exacerbation of chronic diastolic CHF Baseline creatinine less than 2.  Elevated 2.85.  She was started on IV fluid.  Worsened to peak at 3.31.  Gradually improving creatinine. Echo 5/5 with LVEF 60 to 65%, normal right ventricular size and function. PTA Toprol, amlodipine, torsemide, losartan Toprol on hold because of bradycardia Torsemide and losartan on hold because of AKI. Currently on amlodipine 10 mg daily for blood pressure control. Developed shortness of breath this morning.  Chest x-ray suggestive of mild pulm edema. IV fluid has been stopped.  Patient was given Lasix 40 mg IV 1 dose. Overall does not look volume overloaded though.  Continue to monitor CHF symptoms and renal function. Net IO Since Admission: 2,669.47 mL [09/11/22 1017] Continue to monitor for daily intake output, weight, blood pressure, BNP, renal function and electrolytes. Recent Labs  Lab 09/06/22 0137 09/07/22 0127 09/08/22 0819 09/09/22 0557 09/10/22 1146 09/11/22 0510  BUN 53* 43* 40* 42* 42* 45*  CREATININE 2.69* 2.14* 1.99* 2.58* 2.39* 2.34*  NA 135 134* 132* 133* 136 134*  K 3.9 4.4 4.1 4.7 3.6 4.4  MG 2.0  --   --  2.0  --   --      Acute on chronic respiratory failure with hypoxia On 3 L oxygen at home.  Currently remains on 3 to 4 L by nasal cannula. Continue scheduled Breztri inhaler, PRN bronchodilators, Mucinex and incentive spirometry   CAD/HLD Continue statin.  Anticoagulation plan as above.  Acute  on chronic anemia Iron deficiency anemia Baseline hemoglobin above 9.   Hemoglobin was low at 7.7 on 5/4.  Received 1 unit of PRBC.  Hemoglobin stable over 8.  Type 2 diabetes with hyperglycemia Diabetic polyneuropathy A1c 6.8 on 09/04/2022 Currently on Semglee 25 units, along with moderate SSI/Accu-Cheks.  Blood sugar continues to run elevated.   Diabetes care coordinator consult appreciated.  Will increase Semglee to 35 units and also add NovoLog 5 mg 3 times daily.   Continue gabapentin Recent Labs  Lab 09/10/22 0614 09/10/22 1156 09/10/22 1613 09/10/22 2307 09/11/22 0606  GLUCAP 265* 349* 329* 236* 221*    Hypothyroidism Synthroid  Obesity  Body mass index is 38.7 kg/m. Patient has been advised to make an attempt to improve diet and exercise patterns to aid in weight loss.  Impaired mobility PT eval obtained.  SNF recommended.  Goals of care   Code Status: DNR    DVT prophylaxis: Heparin scSCDs Start: 09/08/22 1844  Antimicrobials: IV daptomycin per ID.  PICC line placed 5/9 Fluid: None Consultants: Podiatry, vascular surgery, ID, cardiology Family Communication: Husband at bedside  Status: Inpatient Level of care:  Telemetry Surgical   Patient from: Home Anticipated d/c to: Pending clinical course Needs to continue in-hospital care:  Once CHF improves and renal function is stabilized, can plan for discharge in next 1 to 2 days.   Diet:  Diet Order             Diet NPO time specified  Diet effective midnight                   Scheduled Meds:  sodium chloride   Intravenous Once   amiodarone  200 mg Oral Daily   amLODipine  10 mg Oral Daily   atorvastatin  40 mg Oral Daily   Budeson-Glycopyrrol-Formoterol  2 puff Inhalation BID   Chlorhexidine Gluconate Cloth  6 each Topical Daily   vitamin D3  1,000 Units Oral Q lunch   cyanocobalamin  1,000 mcg Oral Q lunch   gabapentin  200 mg Oral TID   gentamicin (GARAMYCIN) 80 mg in sodium chloride 0.9 %  500 mL irrigation  80 mg Irrigation On Call   guaiFENesin  600 mg Oral BID   insulin aspart  0-15 Units Subcutaneous TID WC   insulin aspart  0-5 Units Subcutaneous QHS   insulin glargine-yfgn  35 Units Subcutaneous Daily   levothyroxine  200 mcg Oral QAC breakfast   mupirocin ointment   Nasal BID   sodium chloride flush  10-40 mL Intracatheter Q12H    PRN meds: acetaminophen, albuterol, alum & mag hydroxide-simeth, guaiFENesin-dextromethorphan, HYDROcodone-acetaminophen, HYDROmorphone (DILAUDID) injection, melatonin, ondansetron (ZOFRAN) IV, phenol, polyethylene glycol, prochlorperazine, sodium chloride flush   Infusions:    ceFAZolin (ANCEF) IV     DAPTOmycin (CUBICIN) 600 mg in sodium chloride 0.9 % IVPB Stopped (09/09/22 1655)   sodium chloride      Antimicrobials: Anti-infectives (From admission, onward)    Start     Dose/Rate Route Frequency Ordered Stop   09/11/22 0145  gentamicin (GARAMYCIN) 80 mg in sodium chloride 0.9 % 500 mL irrigation  80 mg Irrigation On call 09/11/22 0055 09/12/22 0145   09/11/22 0145  ceFAZolin (ANCEF) IVPB 2g/100 mL premix        2 g 200 mL/hr over 30 Minutes Intravenous On call 09/11/22 0055 09/12/22 0145   09/08/22 0956  ceFAZolin (ANCEF) 2-4 GM/100ML-% IVPB  Status:  Discontinued       Note to Pharmacy: [facility], [place]: cabinet override      09/08/22 0956 09/08/22 1002   09/08/22 0700  gentamicin (GARAMYCIN) 80 mg in sodium chloride 0.9 % 500 mL irrigation        80 mg Irrigation To Short Stay 09/08/22 0654 09/08/22 1519   09/08/22 0700  ceFAZolin (ANCEF) IVPB 2g/100 mL premix        2 g 200 mL/hr over 30 Minutes Intravenous To Short Stay 09/08/22 0654 09/08/22 1612   09/07/22 1600  DAPTOmycin (CUBICIN) 600 mg in sodium chloride 0.9 % IVPB        8 mg/kg  74.4 kg (Adjusted) 124 mL/hr over 30 Minutes Intravenous Every 48 hours 09/07/22 1317     09/07/22 1000  vancomycin (VANCOREADY) IVPB 1500 mg/300 mL  Status:  Discontinued         1,500 mg 150 mL/hr over 120 Minutes Intravenous every 72 hours 09/04/22 0105 09/05/22 1019   09/06/22 1200  vancomycin (VANCOREADY) IVPB 750 mg/150 mL  Status:  Discontinued        750 mg 150 mL/hr over 60 Minutes Intravenous Every 48 hours 09/05/22 1019 09/07/22 1317   09/04/22 2200  metroNIDAZOLE (FLAGYL) IVPB 500 mg  Status:  Discontinued       See Hyperspace for full Linked Orders Report.   500 mg 100 mL/hr over 60 Minutes Intravenous Every 12 hours 09/04/22 1022 09/07/22 0830   09/04/22 1100  cefTRIAXone (ROCEPHIN) 2 g in sodium chloride 0.9 % 100 mL IVPB  Status:  Discontinued       See Hyperspace for full Linked Orders Report.   2 g 200 mL/hr over 30 Minutes Intravenous Every 24 hours 09/04/22 1022 09/07/22 0830   09/04/22 0200  vancomycin (VANCOREADY) IVPB 2000 mg/400 mL        2,000 mg 200 mL/hr over 120 Minutes Intravenous  Once 09/04/22 0105 09/04/22 0626   09/03/22 2230  cefTRIAXone (ROCEPHIN) 2 g in sodium chloride 0.9 % 100 mL IVPB  Status:  Discontinued       See Hyperspace for full Linked Orders Report.   2 g 200 mL/hr over 30 Minutes Intravenous Every 24 hours 09/03/22 2218 09/04/22 1022   09/03/22 2230  metroNIDAZOLE (FLAGYL) IVPB 500 mg  Status:  Discontinued       See Hyperspace for full Linked Orders Report.   500 mg 100 mL/hr over 60 Minutes Intravenous Every 12 hours 09/03/22 2218 09/04/22 1022       Nutritional status:  Body mass index is 38.7 kg/m.          Objective: Vitals:   09/11/22 0318 09/11/22 0811  BP: (!) 151/44 (!) 184/67  Pulse: 77 70  Resp: 16 16  Temp: 98.5 F (36.9 C) 97.8 F (36.6 C)  SpO2: 96% 100%    Intake/Output Summary (Last 24 hours) at 09/11/2022 1007 Last data filed at 09/11/2022 0600 Gross per 24 hour  Intake 666.37 ml  Output 750 ml  Net -83.63 ml    Filed Weights   09/09/22 0500 09/10/22 0500 09/11/22 0318  Weight: 108.6 kg 106.1 kg 105.5 kg   Weight change: -  0.6 kg Body mass index is 38.7 kg/m.    Physical Exam: General exam: Pleasant, elderly Caucasian female.  Not in distress Skin: No rashes, lesions or ulcers. HEENT: Atraumatic, normocephalic, no obvious bleeding Lungs: No wheezing, diminished air entry both bases.   CVS: Regular rate and rhythm, no murmur.  Pacemaker extract site bandaged. GI/Abd soft, nontender, nondistended, bowel present CNS: Alert, awake, oriented x 3.  On CPAP Psychiatry: Mood appropriate Extremities: No pedal edema, no calf tenderness.  Status post left great toe amputation   Data Review: I have personally reviewed the laboratory data and studies available.  F/u labs ordered Unresulted Labs (From admission, onward)     Start     Ordered   09/11/22 0500  CBC with Differential/Platelet  Daily,   R     Question:  Specimen collection method  Answer:  Lab=Lab collect   09/10/22 0808   09/11/22 0500  Basic metabolic panel  Daily,   R     Question:  Specimen collection method  Answer:  Lab=Lab collect   09/10/22 0808   09/11/22 0056  Surgical PCR screen  (Screening)  Once,   R        09/11/22 0055   09/08/22 0500  CK  Weekly,   R      09/07/22 1317   09/08/22 0425  Surgical PCR screen  (Screening)  Once,   R        09/08/22 0424            Total time spent in review of labs and imaging, patient evaluation, formulation of plan, documentation and communication with family: 45 minutes  Signed, Lorin Glass, MD [facility] 09/11/2022

## 2022-09-11 NOTE — Progress Notes (Unsigned)
Enrolled patient for a 14 day Zio AT monitor to be mailed to patients home  Dr. Taylor to read 

## 2022-09-11 NOTE — TOC Progression Note (Signed)
Transition of Care [facility]) - Progression Note    Patient Details  Name: Kayla [name] MRN: [id number] Date of Birth: 08/08/1940  Transition of Care [facility]) CM/SW Contact  Erin Sons, [place] Phone Number: 09/11/2022, 1:21 PM  Clinical Narrative:     CSW notified that [facility] would like to see how patient does over the weekend prior to making decision on bed offer.   Expected Discharge Plan: Home w Home Health Services Barriers to Discharge: Continued Medical Work up  Expected Discharge Plan and Services   Discharge Planning Services: CM Consult                       DME Agency: Other - Comment ([facility]) Date DME Agency Contacted: 09/07/22 Time DME Agency Contacted: 1357 Representative spoke with at DME Agency: Pam HH Arranged: RN HH Agency: [facility] Date [facility] Agency Contacted: 09/07/22 Time HH Agency Contacted: 1358 Representative spoke with at [facility] Agency: Kandee Keen   Social Determinants of Health (SDOH) Interventions SDOH Screenings   Food Insecurity: No Food Insecurity (01/20/2022)  Housing: Low Risk  (01/20/2022)  Transportation Needs: No Transportation Needs (01/20/2022)  Utilities: Not At Risk (01/20/2022)  Alcohol Screen: Low Risk  (01/31/2021)  Depression (PHQ2-9): Low Risk  (09/30/2021)  Recent Concern: Depression (PHQ2-9) - Medium Risk (08/18/2021)  Financial Resource Strain: Low Risk  (01/31/2021)  Physical Activity: Insufficiently Active (09/30/2021)  Social Connections: Socially Integrated (09/30/2021)  Stress: No Stress Concern Present (09/30/2021)  Tobacco Use: Medium Risk (09/08/2022)    Readmission Risk Interventions    01/22/2022    2:43 PM 07/23/2021   12:10 PM 03/21/2021   12:53 PM  Readmission Risk Prevention Plan  Transportation Screening Complete Complete Complete  PCP or Specialist Appt within 3-5 Days Complete    HRI or Home Care Consult Complete    Social Work Consult for Recovery Care Planning/Counseling Complete     Palliative Care Screening Not Applicable    Medication Review Oceanographer) Complete Complete Complete  HRI or Home Care Consult  Complete   SW Recovery Care/Counseling Consult  Complete   Palliative Care Screening  Not Applicable   Skilled Nursing Facility  Complete

## 2022-09-11 NOTE — Progress Notes (Addendum)
Patient Name: Kayla [name] Date of Encounter: 09/11/2022  Primary Cardiologist: Donato Schultz, MD Electrophysiologist: Sherryl Manges, MD  Interval Summary   Events of the early am noted > SOB/increased work of breathing requiring increase in O2, PRN breathing treatments & IVF stopped  ~2.7L positive I/O for admit   Inpatient Medications    Scheduled Meds:  sodium chloride   Intravenous Once   amiodarone  200 mg Oral Daily   amLODipine  10 mg Oral Daily   atorvastatin  40 mg Oral Daily   Budeson-Glycopyrrol-Formoterol  2 puff Inhalation BID   Chlorhexidine Gluconate Cloth  6 each Topical Daily   vitamin D3  1,000 Units Oral Q lunch   cyanocobalamin  1,000 mcg Oral Q lunch   gabapentin  200 mg Oral TID   gentamicin (GARAMYCIN) 80 mg in sodium chloride 0.9 % 500 mL irrigation  80 mg Irrigation On Call   guaiFENesin  600 mg Oral BID   insulin aspart  0-15 Units Subcutaneous TID WC   insulin aspart  0-5 Units Subcutaneous QHS   insulin glargine-yfgn  35 Units Subcutaneous Daily   levothyroxine  200 mcg Oral QAC breakfast   mupirocin ointment   Nasal BID   sodium chloride flush  10-40 mL Intracatheter Q12H   Continuous Infusions:  sodium chloride      ceFAZolin (ANCEF) IV     DAPTOmycin (CUBICIN) 600 mg in sodium chloride 0.9 % IVPB Stopped (09/09/22 1655)   sodium chloride     PRN Meds: acetaminophen, albuterol, alum & mag hydroxide-simeth, guaiFENesin-dextromethorphan, HYDROcodone-acetaminophen, HYDROmorphone (DILAUDID) injection, melatonin, ondansetron (ZOFRAN) IV, phenol, polyethylene glycol, prochlorperazine, sodium chloride flush   Vital Signs    Vitals:   09/11/22 0039 09/11/22 0300 09/11/22 0318 09/11/22 0811  BP:   (!) 151/44 (!) 184/67  Pulse:   77 70  Resp:  20 16 16   Temp:   98.5 F (36.9 C) 97.8 F (36.6 C)  TempSrc:   Oral Oral  SpO2: 99%  96% 100%  Weight:   105.5 kg   Height:        Intake/Output Summary (Last 24 hours) at 09/11/2022 1054 Last  data filed at 09/11/2022 0600 Gross per 24 hour  Intake 666.37 ml  Output 750 ml  Net -83.63 ml   Filed Weights   09/09/22 0500 09/10/22 0500 09/11/22 0318  Weight: 108.6 kg 106.1 kg 105.5 kg    Physical Exam    GEN- The patient is elderly and chronically ill appearing, alert and oriented x 3 today.   Lungs- Somewhat diminished basilar sound but no basilar wheeze. Diminished with upper respiratory wheezing noted.  Cardiac- Regular rate and rhythm, no murmurs, rubs or gallops GI- soft, NT, ND, + BS Extremities- no clubbing or cyanosis. No edema  Telemetry    Sinus brady / NSR 50-70s (personally reviewed)  Hospital Course    Kayla [name] is a 82 y.o. female admitted for suspected CIED associated bacteremia at the request of Dr. Pola Corn. [name], diabetic polyneuropathy, sick sinus syndrome post permanent pacemaker implant, hyperlipidemia, obesity, chronic venous insufficiency, COPD, gout, iron deficiency anemia, hypertension, paroxysmal atrial fibrillation on Eliquis. She was admitted with left great toe diabetic foot infection and has recently undergone amputation of her great toe. During the hospitalization blood cultures have returned positive (2 of 2) for MRSA.   Assessment & Plan    CEID Associated Infection in setting of MRSA Bacteremia  2/2 BC positive  for MRSA. TEE 5/6 with RV lead vegetation at level of RA -appreciate ID -daptomycin per ID  -gent on call to Cath Lab     SSS/SND  Atrially dependent prior to temp pacing wire placement, lead extraction on f5/6, post procedure VVI 30. Has not needed pacing in post removal period.  -hold eliquis -hold AV nodal blocking agents -continue amiodarone   AKI on CKD  Baseline Cr ~1.8-2.5 -per TRH   Anemia  -Hgb stable 8.4, per TRH   Left Foot Diabetic Infection with concern for L Great Toe Osteo (POA), s/p partial first ray amputation  -per Podiatry   Acute on chronic  respiratory failure On home CPAP this am to rest.  Will give 40 mg IV lasix to help optimize for procedure.   For questions or updates, please contact [facility] Please consult [email or web address] for contact info under Cardiology/STEMI.  Kayla [name] [address] [place], PA-C  09/11/2022 10:54 AM   EP Attending  Patient seen and examined. Agree with above. The patient is stable for temp-perm PM lead removal. I do not plan to replace her lead as her HR is above 50. No indication to acutely place a new PM. Her very poor microvascular and propenesity for skin infections makes her a poor long term candidate for PM.   Kayla [name]

## 2022-09-11 NOTE — Progress Notes (Signed)
Pt with no food intake today - felt too tired and in pain post procedure.  Glucerna offered and taken - husband assisting pt with intake.  Pt sleeping most of the afternoon after procedure and declines mobility.    Pt continues to have coughing spells - PRN medications administered as ordered.

## 2022-09-11 NOTE — Progress Notes (Signed)
Asked pt if she wanted to ambulate and sit up in chair - pt declined at this time stating that she is too tired and the cath lab procedure was too painful on her skin and she would like to rest.  Will attempt again later this afternoon.

## 2022-09-12 DIAGNOSIS — E11628 Type 2 diabetes mellitus with other skin complications: Secondary | ICD-10-CM | POA: Diagnosis not present

## 2022-09-12 DIAGNOSIS — I480 Paroxysmal atrial fibrillation: Secondary | ICD-10-CM | POA: Diagnosis not present

## 2022-09-12 DIAGNOSIS — L089 Local infection of the skin and subcutaneous tissue, unspecified: Secondary | ICD-10-CM | POA: Diagnosis not present

## 2022-09-12 LAB — BASIC METABOLIC PANEL
Anion gap: 11 (ref 5–15)
BUN: 47 mg/dL — ABNORMAL HIGH (ref 8–23)
CO2: 23 mmol/L (ref 22–32)
Calcium: 9 mg/dL (ref 8.9–10.3)
Chloride: 100 mmol/L (ref 98–111)
Creatinine, Ser: 2.17 mg/dL — ABNORMAL HIGH (ref 0.44–1.00)
GFR, Estimated: 22 mL/min — ABNORMAL LOW (ref >60)
Glucose, Bld: 194 mg/dL — ABNORMAL HIGH (ref 70–99)
Potassium: 4.3 mmol/L (ref 3.5–5.1)
Sodium: 134 mmol/L — ABNORMAL LOW (ref 135–145)

## 2022-09-12 LAB — CBC WITH DIFFERENTIAL/PLATELET
Abs Immature Granulocytes: 0.17 10*3/uL — ABNORMAL HIGH (ref 0.00–0.07)
Basophils Absolute: 0.1 10*3/uL (ref 0.0–0.1)
Basophils Relative: 1 %
Eosinophils Absolute: 0.2 10*3/uL (ref 0.0–0.5)
Eosinophils Relative: 2 %
HCT: 26.6 % — ABNORMAL LOW (ref 36.0–46.0)
Hemoglobin: 8.3 g/dL — ABNORMAL LOW (ref 12.0–15.0)
Immature Granulocytes: 1 %
Lymphocytes Relative: 10 %
Lymphs Abs: 1.3 10*3/uL (ref 0.7–4.0)
MCH: 30 pg (ref 26.0–34.0)
MCHC: 31.2 g/dL (ref 30.0–36.0)
MCV: 96 fL (ref 80.0–100.0)
Monocytes Absolute: 1.2 10*3/uL — ABNORMAL HIGH (ref 0.1–1.0)
Monocytes Relative: 9 %
Neutro Abs: 10.6 10*3/uL — ABNORMAL HIGH (ref 1.7–7.7)
Neutrophils Relative %: 77 %
Platelets: 436 10*3/uL — ABNORMAL HIGH (ref 150–400)
RBC: 2.77 MIL/uL — ABNORMAL LOW (ref 3.87–5.11)
RDW: 14.7 % (ref 11.5–15.5)
WBC: 13.6 10*3/uL — ABNORMAL HIGH (ref 4.0–10.5)
nRBC: 0 % (ref 0.0–0.2)

## 2022-09-12 LAB — GLUCOSE, CAPILLARY
Glucose-Capillary: 199 mg/dL — ABNORMAL HIGH (ref 70–99)
Glucose-Capillary: 346 mg/dL — ABNORMAL HIGH (ref 70–99)
Glucose-Capillary: 310 mg/dL — ABNORMAL HIGH (ref 70–99)
Glucose-Capillary: 226 mg/dL — ABNORMAL HIGH (ref 70–99)

## 2022-09-12 MED ORDER — TORSEMIDE 20 MG PO TABS
40.0000 mg | ORAL_TABLET | Freq: Every day | ORAL | Status: DC
  Administered 2022-09-12 – 2022-09-15 (×4): 40 mg via ORAL
  Filled 2022-09-12 (×5): qty 2

## 2022-09-12 NOTE — Progress Notes (Signed)
Patient Name: Kayla [name] Date of Encounter: 09/12/2022  Primary Cardiologist: Donato Schultz, MD Electrophysiologist: Sherryl Manges, MD  Interval Summary   S/p removal of a temp/perm pacemaker yesterday.  She has remained in sinus rhythm with appropriate heart rates.  Inpatient Medications    Scheduled Meds:  sodium chloride   Intravenous Once   amiodarone  200 mg Oral Daily   amLODipine  10 mg Oral Daily   atorvastatin  40 mg Oral Daily   Budeson-Glycopyrrol-Formoterol  2 puff Inhalation BID   Chlorhexidine Gluconate Cloth  6 each Topical Daily   vitamin D3  1,000 Units Oral Q lunch   cyanocobalamin  1,000 mcg Oral Q lunch   gabapentin  200 mg Oral TID   guaiFENesin  600 mg Oral BID   insulin aspart  0-15 Units Subcutaneous TID WC   insulin aspart  0-5 Units Subcutaneous QHS   insulin glargine-yfgn  35 Units Subcutaneous Daily   levothyroxine  200 mcg Oral QAC breakfast   mupirocin ointment   Nasal BID   sodium chloride flush  10-40 mL Intracatheter Q12H   Continuous Infusions:  DAPTOmycin (CUBICIN) 600 mg in sodium chloride 0.9 % IVPB Stopped (09/11/22 1559)   sodium chloride     PRN Meds: acetaminophen, acetaminophen, albuterol, alum & mag hydroxide-simeth, guaiFENesin-dextromethorphan, HYDROcodone-acetaminophen, HYDROmorphone (DILAUDID) injection, melatonin, ondansetron (ZOFRAN) IV, phenol, polyethylene glycol, prochlorperazine, sodium chloride flush   Vital Signs    Vitals:   09/12/22 0000 09/12/22 0320 09/12/22 0612 09/12/22 0740  BP:  (!) 162/85  (!) 166/81  Pulse: 64 84  74  Resp: 19 20  20   Temp:    98.6 F (37 C)  TempSrc:  Oral  Oral  SpO2: 99% 99%  100%  Weight:   106.9 kg   Height:        Intake/Output Summary (Last 24 hours) at 09/12/2022 0849 Last data filed at 09/12/2022 0236 Gross per 24 hour  Intake 416.45 ml  Output 450 ml  Net -33.55 ml   Filed Weights   09/10/22 0500 09/11/22 0318 09/12/22 0612  Weight: 106.1 kg 105.5 kg 106.9 kg     Physical Exam    GEN- The patient is elderly and chronically ill appearing, alert and oriented x 3 today.   Lungs- Somewhat diminished basilar sound but no basilar wheeze. Diminished with upper respiratory wheezing noted.  Cardiac- Regular rate and rhythm, no murmurs, rubs or gallops GI- soft, NT, ND, + BS Extremities- no clubbing or cyanosis. No edema  Telemetry    Sinus brady / NSR 50-70s (personally reviewed)  Hospital Course    Kayla [name] is a 82 y.o. female admitted for suspected CIED associated bacteremia at the request of Dr. Pola Corn. [name], diabetic polyneuropathy, sick sinus syndrome post permanent pacemaker implant, hyperlipidemia, obesity, chronic venous insufficiency, COPD, gout, iron deficiency anemia, hypertension, paroxysmal atrial fibrillation on Eliquis. She was admitted with left great toe diabetic foot infection and has recently undergone amputation of her great toe. During the hospitalization blood cultures have returned positive (2 of 2) for MRSA.   Assessment & Plan    CEID Associated Infection in setting of MRSA Bacteremia  2/2 BC positive for MRSA. TEE 5/6 with RV lead vegetation at level of RA -appreciate ID - antibiotics per ID     SSS/SND  Atrially dependent prior to temp pacing wire placement, lead extraction on 5/6, post procedure VVI 30. Has not needed pacing, temp pacer  was removed yesterday. - eliquis can be resumed tomorrow - hold AV nodal blocking agents - continue amiodarone  - I suspect she required higher levels of AV node blockade when in-and-out of AF, resulting in sinus bradycardia though this is just conjecture.  AKI on CKD  Baseline Cr ~1.8-2.5 -per TRH   Anemia  -Hgb stable 8.4, per TRH   Left Foot Diabetic Infection with concern for L Great Toe Osteo (POA), s/p partial first ray amputation  -per Podiatry   Acute on chronic respiratory failure On home CPAP this am to rest.   Will give 40 mg IV lasix to help optimize for procedure.   Paroxysmal atrial fibrillation - has been managed with amiodarone - maintaining sinus rhythm - no rush to resume eliquis; ok tomorrow or Monday.  We will sign off. Please call with questions or concerns.  For questions or updates, please contact [facility] Please consult [email or web address] for contact info under Cardiology/STEMI.  Maurice Small, MD 09/12/2022 8:49 AM

## 2022-09-12 NOTE — Plan of Care (Signed)
  Problem: Health Behavior/Discharge Planning: Goal: Ability to manage health-related needs will improve Outcome: Not Progressing   Problem: Clinical Measurements: Goal: Respiratory complications will improve Outcome: Not Progressing   

## 2022-09-12 NOTE — Progress Notes (Signed)
PROGRESS NOTE  Kayla [name]  DOB: October 10, 1940  PCP: Kayla Ravel, MD ZOX:[id number]  DOA: 09/03/2022  LOS: 9 days  Hospital Day: 10  Brief narrative: As per prior documentation by Dr. Lorin Glass, MD: "Kayla [name] is a 82 y.o. female with PMH significant for obesity, OSA, DM2, HTN, HLD, CAD, CHF, paroxysmal A-fib on Eliquis, SSS s/p PPM, carotid artery disease, CKD, chronic anemia, COPD, anxiety/depression, GERD, hypothyroidism, diabetic polyneuropathy, chronic venous insufficiency, h/o left toe amputation 5/2, patient was sent to the ED by her podiatrist Kayla [name] with concern of left great toe diabetic foot infection possible osteomyelitis seen on x-ray.  Further workup with MRI confirmed osteomyelitis. MRI left foot confirmed osteomyelitis. Started on broad-spectrum IV antibiotics Admitted to [facility]. 5/3, s/p left partial first ray amputation.  Blood culture on admission as well as wound culture grew MRSA.  She needed pacemaker lead extraction on 5/7. Currently on IV antibiotics per ID  Hospital course is complicated by rising creatinine for which she was started on IV fluid and now she is getting short of breath with chest x-ray suggestive of pulm edema".  09/12/2022: Patient seen alongside patient's husband, daughter, granddaughter and great granddaughter.  Patient reports shortness of breath.  Will start patient on torsemide 40 Mg p.o. once daily.  Continue nebulizer treatment.  Continue IV antibiotics.  Subjective: Patient reports shortness of breath.  Assessment and plan: Left diabetic foot infection  Left great toe wound infection with concern for osteomyelitis, POA S/p left partial first ray amputation -5/3 Kayla [name] Seen the vascular surgery but revascularization was not needed. Underwent first ray amputation by podiatry.  Per podiatry follow-up today 5/9, dressing to remain clean dry and intact, changes outpatient.  Weightbearing as tolerated to heel in  surgical shoe.  MRSA bacteremia Vegetation in pacemaker lead TEE 5/6 shows a vegetation on RV lead.  Underwent lead extraction on 5/7 Repeat blood culture 5/5 and 5/8 has not shown any growth. Currently on IV daptomycin per ID PICC line placed today 5/9. 09/12/2022: Complete course of antibiotics.  Pacemaker has been removed.  Cardiology input is appreciated.  Paroxysmal A-fib  H/o SSS s/p PPM PTA on Toprol, amiodarone, Eliquis.   Currently in sinus bradycardia post lead extraction. Continue amiodarone.  Toprol on hold.  Eliquis on hold.  Okay to resume once cleared by EP  AKI on CKD 4 Acute exacerbation of chronic diastolic CHF Baseline creatinine less than 2.  Elevated 2.85.  She was started on IV fluid.  Worsened to peak at 3.31.  Gradually improving creatinine. Echo 5/5 with LVEF 60 to 65%, normal right ventricular size and function. PTA Toprol, amlodipine, torsemide, losartan Toprol on hold because of bradycardia Torsemide and losartan on hold because of AKI. Currently on amlodipine 10 mg daily for blood pressure control. Developed shortness of breath this morning.  Chest x-ray suggestive of mild pulm edema. IV fluid has been stopped.  Patient was given Lasix 40 mg IV 1 dose. Overall does not look volume overloaded though.  Continue to monitor CHF symptoms and renal function. Net IO Since Admission: 2,635.92 mL [09/12/22 1629] Continue to monitor for daily intake output, weight, blood pressure, BNP, renal function and electrolytes. Recent Labs  Lab 09/06/22 0137 09/07/22 0127 09/08/22 0819 09/09/22 0557 09/10/22 1146 09/11/22 0510 09/12/22 0351  BUN 53*   < > 40* 42* 42* 45* 47*  CREATININE 2.69*   < > 1.99* 2.58* 2.39* 2.34* 2.17*  NA 135   < > 132*  133* 136 134* 134*  K 3.9   < > 4.1 4.7 3.6 4.4 4.3  MG 2.0  --   --  2.0  --   --   --    < > = values in this interval not displayed.   09/12/2022: AKI seems to have resolved significantly.  Patient has baseline CKD 4.   Patient reports intermittent shortness of breath.  Chest x-ray revealed pulmonary edema.  Start patient on torsemide 40 Mg p.o. once daily.  Will monitor renal function and electrolytes closely.   Acute on chronic respiratory failure with hypoxia On 3 L oxygen at home.  Currently remains on 3 to 4 L by nasal cannula. Continue scheduled Breztri inhaler, PRN bronchodilators, Mucinex and incentive spirometry   CAD/HLD Continue statin.   Anticoagulation plan as above.  Acute on chronic anemia Iron deficiency anemia Baseline hemoglobin above 9.   Hemoglobin was low at 7.7 on 5/4.  Received 1 unit of PRBC.  Hemoglobin stable over 8. 09/12/2022: Hemoglobin is 8.3 g/dL today.  Type 2 diabetes with hyperglycemia Diabetic polyneuropathy A1c 6.8 on 09/04/2022 Currently on Semglee 25 units, along with moderate SSI/Accu-Cheks.  Blood sugar continues to run elevated.   Diabetes care coordinator consult appreciated.  Will increase Semglee to 35 units and also add NovoLog 5 mg 3 times daily.   Continue gabapentin Recent Labs  Lab 09/11/22 1149 09/11/22 1655 09/11/22 2108 09/12/22 0606 09/12/22 1137  GLUCAP 214* 156* 222* 199* 346*   09/12/2022: Blood sugar remains uncontrolled.  Continue to adjust long-acting insulin and short acting insulin accordingly.  Hypothyroidism Synthroid  Obesity  Body mass index is 39.22 kg/m. Patient has been advised to make an attempt to improve diet and exercise patterns to aid in weight loss.  Impaired mobility PT eval obtained.  SNF recommended.  Goals of care   Code Status: DNR    DVT prophylaxis: Heparin scSCDs Start: 09/08/22 1844  Antimicrobials: IV daptomycin per ID.  PICC line placed 5/9 Fluid: None Consultants: Podiatry, vascular surgery, ID, cardiology Family Communication: Husband at bedside  Status: Inpatient Level of care:  Telemetry Surgical   Patient from: Home Anticipated d/c to: Pending clinical course Needs to continue  in-hospital care:  Once CHF improves and renal function is stabilized, can plan for discharge in next 1 to 2 days.   Diet:  Diet Order             Diet Heart Room service appropriate? Yes; Fluid consistency: Thin  Diet effective now                   Scheduled Meds:  sodium chloride   Intravenous Once   amiodarone  200 mg Oral Daily   amLODipine  10 mg Oral Daily   atorvastatin  40 mg Oral Daily   Budeson-Glycopyrrol-Formoterol  2 puff Inhalation BID   Chlorhexidine Gluconate Cloth  6 each Topical Daily   vitamin D3  1,000 Units Oral Q lunch   cyanocobalamin  1,000 mcg Oral Q lunch   gabapentin  200 mg Oral TID   guaiFENesin  600 mg Oral BID   insulin aspart  0-15 Units Subcutaneous TID WC   insulin aspart  0-5 Units Subcutaneous QHS   insulin glargine-yfgn  35 Units Subcutaneous Daily   levothyroxine  200 mcg Oral QAC breakfast   mupirocin ointment   Nasal BID   sodium chloride flush  10-40 mL Intracatheter Q12H    PRN meds: acetaminophen, acetaminophen, albuterol, alum &  mag hydroxide-simeth, guaiFENesin-dextromethorphan, HYDROcodone-acetaminophen, HYDROmorphone (DILAUDID) injection, melatonin, ondansetron (ZOFRAN) IV, phenol, polyethylene glycol, prochlorperazine, sodium chloride flush   Infusions:   DAPTOmycin (CUBICIN) 600 mg in sodium chloride 0.9 % IVPB Stopped (09/11/22 1559)   sodium chloride      Antimicrobials: Anti-infectives (From admission, onward)    Start     Dose/Rate Route Frequency Ordered Stop   09/11/22 1306  ceFAZolin (ANCEF) 2-4 GM/100ML-% IVPB       Note to Pharmacy: Zadie Cleverly B: cabinet override      09/11/22 1306 09/11/22 1330   09/11/22 1306  sodium chloride 0.9 % with gentamicin (GARAMYCIN) ADS Med       Note to Pharmacy: Zadie Cleverly B: cabinet override      09/11/22 1306 09/11/22 1330   09/11/22 0145  gentamicin (GARAMYCIN) 80 mg in sodium chloride 0.9 % 500 mL irrigation  Status:  Discontinued        80 mg Irrigation On  call 09/11/22 0055 09/11/22 1400   09/11/22 0145  ceFAZolin (ANCEF) IVPB 2g/100 mL premix  Status:  Discontinued        2 g 200 mL/hr over 30 Minutes Intravenous On call 09/11/22 0055 09/11/22 1400   09/08/22 0956  ceFAZolin (ANCEF) 2-4 GM/100ML-% IVPB  Status:  Discontinued       Note to Pharmacy: [facility], [place]: cabinet override      09/08/22 0956 09/08/22 1002   09/08/22 0700  gentamicin (GARAMYCIN) 80 mg in sodium chloride 0.9 % 500 mL irrigation        80 mg Irrigation To Short Stay 09/08/22 0654 09/08/22 1519   09/08/22 0700  ceFAZolin (ANCEF) IVPB 2g/100 mL premix        2 g 200 mL/hr over 30 Minutes Intravenous To Short Stay 09/08/22 0654 09/08/22 1612   09/07/22 1600  DAPTOmycin (CUBICIN) 600 mg in sodium chloride 0.9 % IVPB        8 mg/kg  74.4 kg (Adjusted) 124 mL/hr over 30 Minutes Intravenous Every 48 hours 09/07/22 1317     09/07/22 1000  vancomycin (VANCOREADY) IVPB 1500 mg/300 mL  Status:  Discontinued        1,500 mg 150 mL/hr over 120 Minutes Intravenous every 72 hours 09/04/22 0105 09/05/22 1019   09/06/22 1200  vancomycin (VANCOREADY) IVPB 750 mg/150 mL  Status:  Discontinued        750 mg 150 mL/hr over 60 Minutes Intravenous Every 48 hours 09/05/22 1019 09/07/22 1317   09/04/22 2200  metroNIDAZOLE (FLAGYL) IVPB 500 mg  Status:  Discontinued       See Hyperspace for full Linked Orders Report.   500 mg 100 mL/hr over 60 Minutes Intravenous Every 12 hours 09/04/22 1022 09/07/22 0830   09/04/22 1100  cefTRIAXone (ROCEPHIN) 2 g in sodium chloride 0.9 % 100 mL IVPB  Status:  Discontinued       See Hyperspace for full Linked Orders Report.   2 g 200 mL/hr over 30 Minutes Intravenous Every 24 hours 09/04/22 1022 09/07/22 0830   09/04/22 0200  vancomycin (VANCOREADY) IVPB 2000 mg/400 mL        2,000 mg 200 mL/hr over 120 Minutes Intravenous  Once 09/04/22 0105 09/04/22 0626   09/03/22 2230  cefTRIAXone (ROCEPHIN) 2 g in sodium chloride 0.9 % 100 mL IVPB  Status:   Discontinued       See Hyperspace for full Linked Orders Report.   2 g 200 mL/hr over 30 Minutes Intravenous Every 24 hours  09/03/22 2218 09/04/22 1022   09/03/22 2230  metroNIDAZOLE (FLAGYL) IVPB 500 mg  Status:  Discontinued       See Hyperspace for full Linked Orders Report.   500 mg 100 mL/hr over 60 Minutes Intravenous Every 12 hours 09/03/22 2218 09/04/22 1022       Nutritional status:  Body mass index is 39.22 kg/m.          Objective: Vitals:   09/12/22 0740 09/12/22 1140  BP: (!) 166/81 (!) 164/59  Pulse: 74 69  Resp: 20 20  Temp: 98.6 F (37 C) 98.2 F (36.8 C)  SpO2: 100% 94%    Intake/Output Summary (Last 24 hours) at 09/12/2022 1629 Last data filed at 09/12/2022 0236 Gross per 24 hour  Intake 296.45 ml  Output --  Net 296.45 ml    Filed Weights   09/10/22 0500 09/11/22 0318 09/12/22 0612  Weight: 106.1 kg 105.5 kg 106.9 kg   Weight change: 1.4 kg Body mass index is 39.22 kg/m.   Physical Exam: General exam: Patient is obese.  Not in any distress.  Patient is awake and alert.   HEENT: Patient is pale.  No jaundice.   Lungs: Expiratory wheeze.  Decreased air entry. CVS: S1-S2, with ejection systolic murmur.  R GI/Abd: Abdomen is obese, soft and nontender.  CNS: Awake and alert.  Patient moves all extremities.    Data Review: I have personally reviewed the laboratory data and studies available.  F/u labs ordered Unresulted Labs (From admission, onward)     Start     Ordered   09/11/22 0500  CBC with Differential/Platelet  Daily,   R     Question:  Specimen collection method  Answer:  Lab=Lab collect   09/10/22 0808   09/11/22 0500  Basic metabolic panel  Daily,   R     Question:  Specimen collection method  Answer:  Lab=Lab collect   09/10/22 0808   09/11/22 0056  Surgical PCR screen  (Screening)  Once,   R        09/11/22 0055   09/08/22 0500  CK  Weekly,   R      09/07/22 1317            Total time spent in review of labs and  imaging, patient evaluation, formulation of plan, documentation and communication with family:    Time spent: 55 minutes.  Signed, Barnetta Chapel, MD [facility] 09/12/2022

## 2022-09-13 DIAGNOSIS — E11628 Type 2 diabetes mellitus with other skin complications: Secondary | ICD-10-CM | POA: Diagnosis not present

## 2022-09-13 DIAGNOSIS — L089 Local infection of the skin and subcutaneous tissue, unspecified: Secondary | ICD-10-CM | POA: Diagnosis not present

## 2022-09-13 LAB — CBC WITH DIFFERENTIAL/PLATELET
Abs Immature Granulocytes: 0.22 10*3/uL — ABNORMAL HIGH (ref 0.00–0.07)
Basophils Absolute: 0.1 10*3/uL (ref 0.0–0.1)
Basophils Relative: 1 %
Eosinophils Absolute: 0.2 10*3/uL (ref 0.0–0.5)
Eosinophils Relative: 1 %
HCT: 27 % — ABNORMAL LOW (ref 36.0–46.0)
Hemoglobin: 8.4 g/dL — ABNORMAL LOW (ref 12.0–15.0)
Immature Granulocytes: 2 %
Lymphocytes Relative: 10 %
Lymphs Abs: 1.3 10*3/uL (ref 0.7–4.0)
MCH: 30.1 pg (ref 26.0–34.0)
MCHC: 31.1 g/dL (ref 30.0–36.0)
MCV: 96.8 fL (ref 80.0–100.0)
Monocytes Absolute: 1 10*3/uL (ref 0.1–1.0)
Monocytes Relative: 8 %
Neutro Abs: 9.7 10*3/uL — ABNORMAL HIGH (ref 1.7–7.7)
Neutrophils Relative %: 78 %
Platelets: 428 10*3/uL — ABNORMAL HIGH (ref 150–400)
RBC: 2.79 MIL/uL — ABNORMAL LOW (ref 3.87–5.11)
RDW: 14.6 % (ref 11.5–15.5)
WBC: 12.4 10*3/uL — ABNORMAL HIGH (ref 4.0–10.5)
nRBC: 0 % (ref 0.0–0.2)

## 2022-09-13 LAB — RENAL FUNCTION PANEL
Albumin: 1.6 g/dL — ABNORMAL LOW (ref 3.5–5.0)
Anion gap: 10 (ref 5–15)
BUN: 53 mg/dL — ABNORMAL HIGH (ref 8–23)
CO2: 24 mmol/L (ref 22–32)
Calcium: 8.9 mg/dL (ref 8.9–10.3)
Chloride: 99 mmol/L (ref 98–111)
Creatinine, Ser: 2.19 mg/dL — ABNORMAL HIGH (ref 0.44–1.00)
GFR, Estimated: 22 mL/min — ABNORMAL LOW (ref >60)
Glucose, Bld: 219 mg/dL — ABNORMAL HIGH (ref 70–99)
Phosphorus: 4.4 mg/dL (ref 2.5–4.6)
Potassium: 4.7 mmol/L (ref 3.5–5.1)
Sodium: 133 mmol/L — ABNORMAL LOW (ref 135–145)

## 2022-09-13 LAB — GLUCOSE, CAPILLARY
Glucose-Capillary: 231 mg/dL — ABNORMAL HIGH (ref 70–99)
Glucose-Capillary: 395 mg/dL — ABNORMAL HIGH (ref 70–99)
Glucose-Capillary: 324 mg/dL — ABNORMAL HIGH (ref 70–99)
Glucose-Capillary: 222 mg/dL — ABNORMAL HIGH (ref 70–99)

## 2022-09-13 LAB — MAGNESIUM: Magnesium: 2.2 mg/dL (ref 1.7–2.4)

## 2022-09-13 LAB — CULTURE, BLOOD (ROUTINE X 2): Culture: NO GROWTH

## 2022-09-13 MED ORDER — HYDRALAZINE HCL 20 MG/ML IJ SOLN
10.0000 mg | INTRAMUSCULAR | Status: DC | PRN
  Administered 2022-09-13: 20 mg via INTRAVENOUS
  Filled 2022-09-13: qty 1

## 2022-09-13 MED ORDER — CLONIDINE HCL 0.1 MG PO TABS
0.1000 mg | ORAL_TABLET | Freq: Two times a day (BID) | ORAL | Status: AC
  Administered 2022-09-13 – 2022-09-14 (×4): 0.1 mg via ORAL
  Filled 2022-09-13 (×4): qty 1

## 2022-09-13 MED ORDER — FLUTICASONE PROPIONATE 50 MCG/ACT NA SUSP
2.0000 | Freq: Every day | NASAL | Status: DC
  Administered 2022-09-13 – 2022-09-15 (×3): 2 via NASAL
  Filled 2022-09-13: qty 16

## 2022-09-13 MED ORDER — LORATADINE 10 MG PO TABS
10.0000 mg | ORAL_TABLET | Freq: Every day | ORAL | Status: DC
  Administered 2022-09-13 – 2022-09-15 (×3): 10 mg via ORAL
  Filled 2022-09-13 (×3): qty 1

## 2022-09-13 MED ORDER — INSULIN GLARGINE-YFGN 100 UNIT/ML ~~LOC~~ SOLN
20.0000 [IU] | Freq: Two times a day (BID) | SUBCUTANEOUS | Status: DC
  Filled 2022-09-13: qty 0.2

## 2022-09-13 MED ORDER — HYDRALAZINE HCL 25 MG PO TABS
25.0000 mg | ORAL_TABLET | Freq: Four times a day (QID) | ORAL | Status: DC | PRN
  Administered 2022-09-13: 25 mg via ORAL
  Filled 2022-09-13 (×2): qty 1

## 2022-09-13 MED ORDER — HYDRALAZINE HCL 50 MG PO TABS
100.0000 mg | ORAL_TABLET | Freq: Once | ORAL | Status: AC
  Administered 2022-09-13: 100 mg via ORAL
  Filled 2022-09-13: qty 2

## 2022-09-13 MED ORDER — LORAZEPAM 2 MG/ML IJ SOLN
0.5000 mg | Freq: Once | INTRAMUSCULAR | Status: AC
  Administered 2022-09-13: 0.5 mg via INTRAVENOUS
  Filled 2022-09-13: qty 1

## 2022-09-13 MED ORDER — INSULIN GLARGINE-YFGN 100 UNIT/ML ~~LOC~~ SOLN
20.0000 [IU] | Freq: Two times a day (BID) | SUBCUTANEOUS | Status: DC
  Administered 2022-09-14: 20 [IU] via SUBCUTANEOUS
  Filled 2022-09-13 (×2): qty 0.2

## 2022-09-13 NOTE — Plan of Care (Signed)
  Problem: Health Behavior/Discharge Planning: Goal: Ability to manage health-related needs will improve Outcome: Progressing   Problem: Activity: Goal: Risk for activity intolerance will decrease Outcome: Progressing   Problem: Clinical Measurements: Goal: Respiratory complications will improve Outcome: Not Progressing

## 2022-09-13 NOTE — Progress Notes (Signed)
PROGRESS NOTE  Kayla [name]  DOB: 10-30-1940  PCP: Ailene Ravel, MD VZD:[id number]  DOA: 09/03/2022  LOS: 10 days  Hospital Day: 11  Brief narrative: Patient is an 82 year old female past medical history significant for obesity, OSA, DM2, HTN, HLD, CAD, CHF, paroxysmal A-fib on Eliquis, SSS s/p PPM, carotid artery disease, CKD, chronic anemia, COPD, anxiety/depression, GERD, hypothyroidism, diabetic polyneuropathy, chronic venous insufficiency, h/o left toe amputation.  On 09/03/2022, patient was sent to the ED by her podiatrist, Dr. Annamary Rummage, with concern for left great toe diabetic foot infection/possible osteomyelitis.  MRI of the left foot done at the hospital confirmed osteomyelitis.  Patient was started on broad-spectrum IV antibiotics.  Patient underwent left partial first ray amputation on 09/04/2022.  The wound culture and blood culture grew MRSA.  Further workup revealed likely pacemaker lead infection.  Pulmonary pacemaker was extracted on 09/08/2022.  Patient will need 6 weeks of IV antibiotics, starting on 09/08/2022.  Patient is currently on IV daptomycin.  Hospital course has been complicated by acute kidney injury.  Acute kidney injury has resolved significantly.  Patient has baseline chronic kidney disease disease stage IV.  Patient was on torsemide 80 Mg once daily prior to admission.  Torsemide was held at some point due to worsening renal function, and patient was volume resuscitated.  AKI seems to have resolved.  Torsemide 40 Mg p.o. once daily has been restarted.  Continue to monitor renal function and electrolytes closely.  Heart rate has been greater than 60 bpm.  The plan is to discharge patient to a rehab facility.  09/12/2022: Patient seen alongside patient's husband, daughter, granddaughter and great granddaughter.  Patient reports shortness of breath.  Will start patient on torsemide 40 Mg p.o. once daily.  Continue nebulizer treatment.  Continue IV  antibiotics.  09/13/2022: Continue IV antibiotics.  Shortness of breath is improved with torsemide.  Continue to monitor renal function and electrolytes.  Also monitor heart rate as permanent pacemaker has been extracted.  Subjective: No new complaints today.  Assessment and plan: Left diabetic foot infection  Left great toe wound infection with concern for osteomyelitis, POA S/p left partial first ray amputation -5/3 Dr. Ardelle Anton Seen the vascular surgery but revascularization was not needed. Underwent first ray amputation by podiatry.  Per podiatry follow-up today 5/9, dressing to remain clean dry and intact, changes outpatient.  Weightbearing as tolerated to heel in surgical shoe.  MRSA bacteremia Vegetation on pacemaker lead -TEE 09/07/22 showed a vegetation on RV lead.  Underwent lead extraction on 09/08/2022 -Repeat blood culture done on 09/06/2022 has not grown any organisms. -Results of blood culture done on 09/09/2022 is pending.   -Currently on IV daptomycin per ID -PICC line placed on 09/10/2022.   09/13/2022: Complete course of antibiotics (6 weeks from 09/08/2022).  Pacemaker has been removed.  Cardiology input is appreciated.  Paroxysmal A-fib  H/o SSS s/p PPM PTA on Toprol, amiodarone, Eliquis.   Currently in sinus bradycardia post lead extraction. Continue amiodarone.  Toprol on hold.  Eliquis on hold.  Okay to resume once cleared by EP  AKI on CKD 4 Acute exacerbation of chronic diastolic CHF Baseline creatinine less than 2.  Elevated 2.85.  She was started on IV fluid.  Worsened to peak at 3.31.  Gradually improving creatinine. Echo 5/5 with LVEF 60 to 65%, normal right ventricular size and function. PTA Toprol, amlodipine, torsemide, losartan Toprol on hold because of bradycardia Torsemide and losartan on hold because of AKI. Currently on amlodipine  10 mg daily for blood pressure control. Developed shortness of breath this morning.  Chest x-ray suggestive of mild pulm  edema. IV fluid has been stopped.  Patient was given Lasix 40 mg IV 1 dose. Overall does not look volume overloaded though.  Continue to monitor CHF symptoms and renal function.  Net IO Since Admission: 1,835.92 mL [09/13/22 1554] Continue to monitor for daily intake output, weight, blood pressure, BNP, renal function and electrolytes. Recent Labs  Lab 09/09/22 0557 09/10/22 1146 09/11/22 0510 09/12/22 0351 09/13/22 0035 09/13/22 0305  BUN 42* 42* 45* 47*  --  53*  CREATININE 2.58* 2.39* 2.34* 2.17*  --  2.19*  NA 133* 136 134* 134*  --  133*  K 4.7 3.6 4.4 4.3  --  4.7  MG 2.0  --   --   --  2.2  --    09/13/2022: Patient has baseline CKD 4.  Serum creatinine is 2.19 today.  Continue to monitor renal function and electrolytes.  Acute on chronic respiratory failure with hypoxia On 3 L oxygen at home.  Currently remains on 3 to 4 L by nasal cannula. Continue scheduled Breztri inhaler, PRN bronchodilators, Mucinex and incentive spirometry  CAD/HLD Continue statin.   Anticoagulation plan as above.  Acute on chronic anemia Iron deficiency anemia Baseline hemoglobin above 9.   Hemoglobin was low at 7.7 on 5/4.  Received 1 unit of PRBC.  Hemoglobin stable over 8. 09/12/2022: Hemoglobin is 8.3 g/dL today.  Type 2 diabetes with hyperglycemia Diabetic polyneuropathy A1c 6.8 on 09/04/2022 Currently on Semglee 25 units, along with moderate SSI/Accu-Cheks.  Blood sugar continues to run elevated.   Diabetes care coordinator consult appreciated.  Will increase Semglee to 35 units and also add NovoLog 5 mg 3 times daily.   Continue gabapentin Recent Labs  Lab 09/12/22 1137 09/12/22 1715 09/12/22 2159 09/13/22 0624 09/13/22 1143  GLUCAP 346* 310* 226* 231* 395*   09/13/2022: Blood sugar remains uncontrolled.  Continue to adjust long-acting insulin and short acting insulin accordingly.   Hypothyroidism Synthroid  Hypertensive urgency: -Gradually optimize. -Goal blood pressure  should be less than 130/80 mmHg. -Start hydralazine as needed. -Clonidine 0.1 Mg p.o. twice daily.  Obesity  Body mass index is 38.96 kg/m. Patient has been advised to make an attempt to improve diet and exercise patterns to aid in weight loss.  Impaired mobility PT eval obtained.  SNF recommended.  Goals of care   Code Status: DNR    DVT prophylaxis: Heparin scSCDs Start: 09/08/22 1844  Antimicrobials: IV daptomycin per ID.  PICC line placed 5/9 Fluid: None Consultants: Podiatry, vascular surgery, ID, cardiology Family Communication: Husband at bedside  Status: Inpatient Level of care:  Telemetry Surgical   Patient from: Home Anticipated d/c to: Pending clinical course Needs to continue in-hospital care:  Once CHF improves and renal function is stabilized, can plan for discharge in next 1 to 2 days.   Diet:  Diet Order             Diet Heart Room service appropriate? Yes; Fluid consistency: Thin  Diet effective now                   Scheduled Meds:  sodium chloride   Intravenous Once   amiodarone  200 mg Oral Daily   amLODipine  10 mg Oral Daily   atorvastatin  40 mg Oral Daily   Budeson-Glycopyrrol-Formoterol  2 puff Inhalation BID   Chlorhexidine Gluconate Cloth  6 each Topical  Daily   vitamin D3  1,000 Units Oral Q lunch   cloNIDine  0.1 mg Oral BID   cyanocobalamin  1,000 mcg Oral Q lunch   fluticasone  2 spray Each Nare Daily   gabapentin  200 mg Oral TID   guaiFENesin  600 mg Oral BID   insulin aspart  0-15 Units Subcutaneous TID WC   insulin aspart  0-5 Units Subcutaneous QHS   [START ON 09/14/2022] insulin glargine-yfgn  20 Units Subcutaneous BID   levothyroxine  200 mcg Oral QAC breakfast   loratadine  10 mg Oral Daily   mupirocin ointment   Nasal BID   sodium chloride flush  10-40 mL Intracatheter Q12H   torsemide  40 mg Oral Daily    PRN meds: acetaminophen, acetaminophen, albuterol, guaiFENesin-dextromethorphan, hydrALAZINE,  HYDROcodone-acetaminophen, HYDROmorphone (DILAUDID) injection, melatonin, ondansetron (ZOFRAN) IV, phenol, polyethylene glycol, sodium chloride flush   Infusions:   DAPTOmycin (CUBICIN) 600 mg in sodium chloride 0.9 % IVPB 600 mg (09/13/22 1548)    Antimicrobials: Anti-infectives (From admission, onward)    Start     Dose/Rate Route Frequency Ordered Stop   09/11/22 1306  ceFAZolin (ANCEF) 2-4 GM/100ML-% IVPB       Note to Pharmacy: Zadie Cleverly B: cabinet override      09/11/22 1306 09/11/22 1330   09/11/22 1306  sodium chloride 0.9 % with gentamicin (GARAMYCIN) ADS Med       Note to Pharmacy: Zadie Cleverly B: cabinet override      09/11/22 1306 09/11/22 1330   09/11/22 0145  gentamicin (GARAMYCIN) 80 mg in sodium chloride 0.9 % 500 mL irrigation  Status:  Discontinued        80 mg Irrigation On call 09/11/22 0055 09/11/22 1400   09/11/22 0145  ceFAZolin (ANCEF) IVPB 2g/100 mL premix  Status:  Discontinued        2 g 200 mL/hr over 30 Minutes Intravenous On call 09/11/22 0055 09/11/22 1400   09/08/22 0956  ceFAZolin (ANCEF) 2-4 GM/100ML-% IVPB  Status:  Discontinued       Note to Pharmacy: [facility] [place]: cabinet override      09/08/22 0956 09/08/22 1002   09/08/22 0700  gentamicin (GARAMYCIN) 80 mg in sodium chloride 0.9 % 500 mL irrigation        80 mg Irrigation To Short Stay 09/08/22 0654 09/08/22 1519   09/08/22 0700  ceFAZolin (ANCEF) IVPB 2g/100 mL premix        2 g 200 mL/hr over 30 Minutes Intravenous To Short Stay 09/08/22 0654 09/08/22 1612   09/07/22 1600  DAPTOmycin (CUBICIN) 600 mg in sodium chloride 0.9 % IVPB        8 mg/kg  74.4 kg (Adjusted) 124 mL/hr over 30 Minutes Intravenous Every 48 hours 09/07/22 1317     09/07/22 1000  vancomycin (VANCOREADY) IVPB 1500 mg/300 mL  Status:  Discontinued        1,500 mg 150 mL/hr over 120 Minutes Intravenous every 72 hours 09/04/22 0105 09/05/22 1019   09/06/22 1200  vancomycin (VANCOREADY) IVPB 750 mg/150 mL  Status:   Discontinued        750 mg 150 mL/hr over 60 Minutes Intravenous Every 48 hours 09/05/22 1019 09/07/22 1317   09/04/22 2200  metroNIDAZOLE (FLAGYL) IVPB 500 mg  Status:  Discontinued       See Hyperspace for full Linked Orders Report.   500 mg 100 mL/hr over 60 Minutes Intravenous Every 12 hours 09/04/22 1022 09/07/22 0830  09/04/22 1100  cefTRIAXone (ROCEPHIN) 2 g in sodium chloride 0.9 % 100 mL IVPB  Status:  Discontinued       See Hyperspace for full Linked Orders Report.   2 g 200 mL/hr over 30 Minutes Intravenous Every 24 hours 09/04/22 1022 09/07/22 0830   09/04/22 0200  vancomycin (VANCOREADY) IVPB 2000 mg/400 mL        2,000 mg 200 mL/hr over 120 Minutes Intravenous  Once 09/04/22 0105 09/04/22 0626   09/03/22 2230  cefTRIAXone (ROCEPHIN) 2 g in sodium chloride 0.9 % 100 mL IVPB  Status:  Discontinued       See Hyperspace for full Linked Orders Report.   2 g 200 mL/hr over 30 Minutes Intravenous Every 24 hours 09/03/22 2218 09/04/22 1022   09/03/22 2230  metroNIDAZOLE (FLAGYL) IVPB 500 mg  Status:  Discontinued       See Hyperspace for full Linked Orders Report.   500 mg 100 mL/hr over 60 Minutes Intravenous Every 12 hours 09/03/22 2218 09/04/22 1022       Nutritional status:  Body mass index is 38.96 kg/m.          Objective: Vitals:   09/13/22 1500 09/13/22 1550  BP: (!) 191/59 (!) 183/51  Pulse:  65  Resp:  17  Temp:    SpO2:  93%    Intake/Output Summary (Last 24 hours) at 09/13/2022 1554 Last data filed at 09/13/2022 1100 Gross per 24 hour  Intake 0 ml  Output 800 ml  Net -800 ml    Filed Weights   09/11/22 0318 09/12/22 0612 09/13/22 0500  Weight: 105.5 kg 106.9 kg 106.2 kg   Weight change: -0.7 kg Body mass index is 38.96 kg/m.   Physical Exam: General exam: Patient is obese.  Not in any distress.  Patient is awake and alert.   HEENT: Patient is pale.  No jaundice.   Lungs: Expiratory wheeze.  Decreased air entry. CVS: S1-S2, with  ejection systolic murmur.  R GI/Abd: Abdomen is obese, soft and nontender.  CNS: Awake and alert.  Patient moves all extremities.    Data Review: I have personally reviewed the laboratory data and studies available.  F/u labs ordered Unresulted Labs (From admission, onward)     Start     Ordered   09/13/22 0500  CBC with Differential/Platelet  Tomorrow morning,   R       Question:  Specimen collection method  Answer:  Unit=Unit collect   09/12/22 1702   09/11/22 0056  Surgical PCR screen  (Screening)  Once,   R        09/11/22 0055   09/08/22 0500  CK  Weekly,   R      09/07/22 1317            Total time spent in review of labs and imaging, patient evaluation, formulation of plan, documentation and communication with family:    Time spent: 55 minutes.  Signed, Barnetta Chapel, MD [facility] 09/13/2022

## 2022-09-14 LAB — GLUCOSE, CAPILLARY
Glucose-Capillary: 266 mg/dL — ABNORMAL HIGH (ref 70–99)
Glucose-Capillary: 311 mg/dL — ABNORMAL HIGH (ref 70–99)
Glucose-Capillary: 258 mg/dL — ABNORMAL HIGH (ref 70–99)
Glucose-Capillary: 322 mg/dL — ABNORMAL HIGH (ref 70–99)

## 2022-09-14 LAB — CULTURE, BLOOD (ROUTINE X 2)

## 2022-09-14 MED ORDER — INSULIN GLARGINE-YFGN 100 UNIT/ML ~~LOC~~ SOLN
25.0000 [IU] | Freq: Two times a day (BID) | SUBCUTANEOUS | Status: DC
  Administered 2022-09-14 – 2022-09-15 (×2): 25 [IU] via SUBCUTANEOUS
  Filled 2022-09-14 (×3): qty 0.25

## 2022-09-14 MED ORDER — INSULIN ASPART 100 UNIT/ML IJ SOLN
0.0000 [IU] | Freq: Every day | INTRAMUSCULAR | Status: DC
  Administered 2022-09-14: 4 [IU] via SUBCUTANEOUS

## 2022-09-14 MED ORDER — APIXABAN 2.5 MG PO TABS
2.5000 mg | ORAL_TABLET | Freq: Two times a day (BID) | ORAL | Status: DC
  Administered 2022-09-14 – 2022-09-15 (×2): 2.5 mg via ORAL
  Filled 2022-09-14 (×2): qty 1

## 2022-09-14 MED ORDER — INSULIN ASPART 100 UNIT/ML IJ SOLN
0.0000 [IU] | Freq: Three times a day (TID) | INTRAMUSCULAR | Status: DC
  Administered 2022-09-14 – 2022-09-15 (×2): 11 [IU] via SUBCUTANEOUS
  Administered 2022-09-15: 7 [IU] via SUBCUTANEOUS

## 2022-09-14 MED ORDER — INSULIN ASPART 100 UNIT/ML IJ SOLN
4.0000 [IU] | Freq: Three times a day (TID) | INTRAMUSCULAR | Status: DC
  Administered 2022-09-14 – 2022-09-15 (×3): 4 [IU] via SUBCUTANEOUS

## 2022-09-14 NOTE — Inpatient Diabetes Management (Signed)
Inpatient Diabetes Program Recommendations  AACE/ADA: New Consensus Statement on Inpatient Glycemic Control (2015)  Target Ranges:  Prepandial:   less than 140 mg/dL      Peak postprandial:   less than 180 mg/dL (1-2 hours)      Critically ill patients:  140 - 180 mg/dL   Lab Results  Component Value Date   GLUCAP 266 (H) 09/14/2022   HGBA1C 6.8 (H) 09/04/2022    Review of Glycemic Control  Latest Reference Range & Units 09/13/22 11:43 09/13/22 17:10 09/13/22 21:22 09/14/22 06:13  Glucose-Capillary 70 - 99 mg/dL 914 (H) 782 (H) 956 (H) 266 (H)  (H): Data is abnormally high Diabetes history: Type 2 DM Outpatient Diabetes medications: Lantus 55 nits QD, Novolog 10-17 units SSI PRN Current orders for Inpatient glycemic control: Semglee 20 units BID, Novolog 0-15 units TID & HS  Inpatient Diabetes Program Recommendations:    Consider adding Novolog 3 units TID (assuming patient is consuming >50% of meals)  Thanks, Lujean Rave, MSN, RNC-OB Diabetes Coordinator [phone] (8a-5p)

## 2022-09-14 NOTE — TOC Progression Note (Addendum)
Transition of Care [facility]) - Progression Note    Patient Details  Name: Kayla [name] MRN: [id number] Date of Birth: 02/02/1941  Transition of [facility] CM/SW Contact  Erin Sons, [place] Phone Number: 09/14/2022, 2:11 PM  Clinical Narrative:      CSW contacted [facility] regarding SNF referral. They will have nursing director further review clinicals and notify CSW on decision.   1400: CSW met with pt and pt's spouse bedside. Informed [facility] referral still pending. CSW provided only other SNF other([facility]). Pt states she will not go to [facility]. CSW inquires about expanding SNF search; pt would like to hear from [facility] before expanding search. She does state she would be interested in [facility] if [facility] cannot offer bed. CSW agrees to double check with [employer].   1450: CSW informed that [facility] can offer bed to pt for tomorrow. CSW notified pt and pt's spouse over the phone. They are agreeable to [facility]. CSW explained insurance auth process and answered spouses questions.   Will need updated therapy notes for auth. Will initiate auth once clinicals are available.   Expected Discharge Plan: Skilled Nursing Facility Barriers to Discharge: Continued Medical Work up  Expected Discharge Plan and Services   Discharge Planning Services: CM Consult                       DME Agency: Other - Comment ([facility]) Date DME Agency Contacted: 09/07/22 Time DME Agency Contacted: 1357 Representative spoke with at DME Agency: Pam HH Arranged: RN HH Agency: [facility] Date [facility] Agency Contacted: 09/07/22 Time HH Agency Contacted: 1358 Representative spoke with at [facility] Agency: Kandee Keen   Social Determinants of Health (SDOH) Interventions SDOH Screenings   Food Insecurity: No Food Insecurity (01/20/2022)  Housing: Low Risk  (01/20/2022)  Transportation Needs: No Transportation Needs (01/20/2022)  Utilities: Not At Risk (01/20/2022)  Alcohol  Screen: Low Risk  (01/31/2021)  Depression (PHQ2-9): Low Risk  (09/30/2021)  Recent Concern: Depression (PHQ2-9) - Medium Risk (08/18/2021)  Financial Resource Strain: Low Risk  (01/31/2021)  Physical Activity: Insufficiently Active (09/30/2021)  Social Connections: Socially Integrated (09/30/2021)  Stress: No Stress Concern Present (09/30/2021)  Tobacco Use: Medium Risk (09/11/2022)    Readmission Risk Interventions    01/22/2022    2:43 PM 07/23/2021   12:10 PM 03/21/2021   12:53 PM  Readmission Risk Prevention Plan  Transportation Screening Complete Complete Complete  PCP or Specialist Appt within 3-5 Days Complete    HRI or Home Care Consult Complete    Social Work Consult for Recovery Care Planning/Counseling Complete    Palliative Care Screening Not Applicable    Medication Review Oceanographer) Complete Complete Complete  HRI or Home Care Consult  Complete   SW Recovery Care/Counseling Consult  Complete   Palliative Care Screening  Not Applicable   Skilled Nursing Facility  Complete

## 2022-09-14 NOTE — Progress Notes (Signed)
Physical Therapy Treatment Patient Details Name: Kayla [name] MRN: [id number] DOB: Jul 29, 1940 Today's Date: 09/14/2022   History of Present Illness Pt is 82 year old presented to [facility] on  09/03/22 for lt great toe osteomyelitis. Underwent lt great toe amputation on 09/04/22. Pt also found to have MRSA bacteremia. Pt also with rt great toe fx. Pt underwent pacer extraction on 09/08/22 and placement of LUE temp wires. Removal of LUE temp wires 5/10. PMH - copd on 3L O2, OSA, pacer, afib, DM, lymphedema, TKA, lt 4th toe amputation.    PT Comments    Pt received in supine, alert and pleasantly agreeable to therapy session, pt needing increased time to initiate and perform all tasks due to mild anxiety and c/o dypsnea on exertion. Pt needing up to minA for bed mobility and up to modA for sit<>stand transfers from chair height. Pt performed short gait trial at bedside (forward/backward stepping ~64ft) with RW and chair follow for safety, and reports 3/4 DOE with gait, down to 2/4 resting. SpO2 and HR WFL on 3.5L O2 [place] throughout. RN notified pt requesting breathing treatment at end of session, pt agreeable to sit up in chair at least 1-2 hours at end of session. Pt continues to benefit from PT services to progress toward functional mobility goals.    Recommendations for follow up therapy are one component of a multi-disciplinary discharge planning process, led by the attending physician.  Recommendations may be updated based on patient status, additional functional criteria and insurance authorization.  Follow Up Recommendations  Can patient physically be transported by private vehicle: No    Assistance Recommended at Discharge Frequent or constant Supervision/Assistance  Patient can return home with the following A lot of help with bathing/dressing/bathroom;Assist for transportation;Help with stairs or ramp for entrance;Assistance with cooking/housework;Two people to help with walking and/or transfers    Equipment Recommendations  Wheelchair (measurements PT);Wheelchair cushion (measurements PT)    Recommendations for Other Services       Precautions / Restrictions Precautions Precautions: Fall Precaution Comments: Contact Required Braces or Orthoses: Other Brace Other Brace: Bilateral post op shoes Restrictions Weight Bearing Restrictions: Yes RLE Weight Bearing: Weight bearing as tolerated LLE Weight Bearing: Weight bearing as tolerated Other Position/Activity Restrictions: in post op shoes, L heel WB per spouse     Mobility  Bed Mobility Overal bed mobility: Needs Assistance Bed Mobility: Rolling, Sidelying to Sit Rolling: Min guard Sidelying to sit: Min assist, HOB elevated       General bed mobility comments: Increased time to initiate and perform, cues for log roll to R EOB for comfort, with pt able to use bed rail now with LUE and improved self-assist with this technique now that she doesn't have any LUE precs    Transfers Overall transfer level: Needs assistance Equipment used: Rolling walker (2 wheels), 1 person hand held assist Transfers: Sit to/from Stand, Bed to chair/wheelchair/BSC Sit to Stand: Mod assist, +2 physical assistance, From elevated surface, Min assist   Step pivot transfers: Min assist, +2 safety/equipment, From elevated surface       General transfer comment: Assist to bring hips up and for balance. Pt using RUE to push from EOB with cues and LUE on RW per her preference. Initially pt requiring minA from elevated bed height, but from chair height<>RW pt needs up to modA +2 and reminders for UE placement with each transfer.    Ambulation/Gait Ambulation/Gait assistance: Min assist, +2 safety/equipment Gait Distance (Feet): 15 Feet Assistive device: Rolling  walker (2 wheels) Gait Pattern/deviations: Step-to pattern, Step-through pattern, Decreased stride length, Trunk flexed       General Gait Details: Pt states dypsnea is her main  limitation more than pain this date, 3/4 DOE and 2/4 resting post-exertion. Pt with step-to and short step-through pattern using RW for forward/backward steps at bedside, she needs assist to manage RW and mod cues for posture/pursed-lip breathing throughout. Seated break when pt work of breathing too elevated. RR high 20's with exertion. RN called RT to room at end of session per pt request for breathing treatment.   Stairs             Wheelchair Mobility    Modified Rankin (Stroke Patients Only)       Balance Overall balance assessment: Needs assistance Sitting-balance support: Single extremity supported, Feet supported Sitting balance-Leahy Scale: Poor Sitting balance - Comments: Prefers UE suppport   Standing balance support: Single extremity supported, During functional activity Standing balance-Leahy Scale: Poor Standing balance comment: BUE support of RW for weight shifting needed                            Cognition Arousal/Alertness: Awake/alert Behavior During Therapy: WFL for tasks assessed/performed, Anxious Overall Cognitive Status: Within Functional Limits for tasks assessed                                 General Comments: Pt pleasantly cooperative, slow processing but agreeable to attempt all tasks with increased time to perform due to moderate DOE.        Exercises General Exercises - Lower Extremity Ankle Circles/Pumps: AROM, Both, 10 reps, Supine Quad Sets: AROM, Both, Supine, 10 reps Gluteal Sets: AROM, 5 reps, Supine Long Arc Quad: AROM, Both, Seated, 10 reps Hip ABduction/ADduction:  (vcs for once pt returns to bed from chair, pt receptive) Other Exercises Other Exercises: IS x 5 reps (achieves ~750 mL)    General Comments General comments (skin integrity, edema, etc.): BP 171/59 (89) HR 68 bpm resting post-exertion. SpO2 99% on 3.5L O2 [place]      Pertinent Vitals/Pain Pain Assessment Pain Assessment: 0-10 Pain Score:  3  Pain Location: back and generalized "all over" Pain Descriptors / Indicators: Discomfort, Numbness Pain Intervention(s): Monitored during session, Limited activity within patient's tolerance, Repositioned, Other (comment) (pt states dyspnea is a bigger concern for her than any pain she is having; she reports numbness in LLE surgical site)    Home Living                          Prior Function            PT Goals (current goals can now be found in the care plan section) Acute Rehab PT Goals Patient Stated Goal: To get stronger at post-acute rehab facility and less pain prior to going home. PT Goal Formulation: With patient/family Time For Goal Achievement: 09/23/22 Progress towards PT goals: Progressing toward goals    Frequency    Min 1X/week      PT Plan Current plan remains appropriate    Co-evaluation              AM-PAC PT "6 Clicks" Mobility   Outcome Measure  Help needed turning from your back to your side while in a flat bed without using bedrails?: A Little Help needed moving from  lying on your back to sitting on the side of a flat bed without using bedrails?: A Lot (with no rail) Help needed moving to and from a bed to a chair (including a wheelchair)?: A Little Help needed standing up from a chair using your arms (e.g., wheelchair or bedside chair)?: A Lot Help needed to walk in hospital room?: Total Help needed climbing 3-5 steps with a railing? : Total 6 Click Score: 12    End of Session Equipment Utilized During Treatment: Oxygen;Other (comment);Gait belt (bilateral post op shoes) Activity Tolerance: Patient tolerated treatment well;Treatment limited secondary to medical complications (Comment);Other (comment) (dyspnea limiting standing/gait tolerance) Patient left: with call bell/phone within reach;in chair;with chair alarm set Nurse Communication: Mobility status;Other (comment) (pt request breathing tx) PT Visit Diagnosis: Unsteadiness  on feet (R26.81);Other abnormalities of gait and mobility (R26.89);Difficulty in walking, not elsewhere classified (R26.2)     Time: 4098-1191 PT Time Calculation (min) (ACUTE ONLY): 38 min  Charges:  $Gait Training: 8-22 mins $Therapeutic Exercise: 8-22 mins $Therapeutic Activity: 8-22 mins                     Kyston Gonce P., PTA Acute Rehabilitation Services Secure Chat Preferred 9a-5:30pm Office: [phone]    Dorathy Kinsman [facility] 09/14/2022, 4:18 PM

## 2022-09-14 NOTE — Progress Notes (Signed)
Attempted to get standing weight. Patient got a little nauseous.   Patient would like to stay sitting in the chair thru breakfast and get back in the bed to get her weight.   Will pass along to days.

## 2022-09-14 NOTE — Progress Notes (Signed)
PROGRESS NOTE  Kayla [name]  DOB: 04/30/1941  PCP: Ailene Ravel, MD ZOX:[id number]  DOA: 09/03/2022  LOS: 11 days  Hospital Day: 12  Brief narrative: Patient is an 82 year old female past medical history significant for obesity, OSA, DM2, HTN, HLD, CAD, CHF, paroxysmal A-fib on Eliquis, SSS s/p PPM, carotid artery disease, CKD, chronic anemia, COPD, anxiety/depression, GERD, hypothyroidism, diabetic polyneuropathy, chronic venous insufficiency, h/o left toe amputation.  On 09/03/2022, patient was sent to the ED by her podiatrist, Dr. Annamary Rummage, with concern for left great toe diabetic foot infection/possible osteomyelitis.  MRI of the left foot done at the hospital confirmed osteomyelitis.  Patient was started on broad-spectrum IV antibiotics.  Patient underwent left partial first ray amputation on 09/04/2022.  The wound culture and blood culture grew MRSA.  Further workup revealed likely pacemaker lead infection.  Pulmonary pacemaker was extracted on 09/08/2022.  Patient will need 6 weeks of IV antibiotics, starting on 09/08/2022.  Patient is currently on IV daptomycin.  Hospital course has been complicated by acute kidney injury.  Acute kidney injury has resolved significantly.  Patient has baseline chronic kidney disease disease stage IV.  Patient was on torsemide 80 Mg once daily prior to admission.  Torsemide was held at some point due to worsening renal function, and patient was volume resuscitated.  AKI seems to have resolved.  Torsemide 40 Mg p.o. once daily has been restarted.  Continue to monitor renal function and electrolytes closely.  Heart rate has been greater than 60 bpm.  The plan is to discharge patient to a rehab facility.  09/12/2022: Patient seen alongside patient's husband, daughter, granddaughter and great granddaughter.  Patient reports shortness of breath.  Will start patient on torsemide 40 Mg p.o. once daily.  Continue nebulizer treatment.  Continue IV  antibiotics.  09/13/2022: Continue IV antibiotics.  Shortness of breath is improved with torsemide.  Continue to monitor renal function and electrolytes.  Also monitor heart rate as permanent pacemaker has been extracted.  Subjective: No new complaints today. Feeling fatigued  Assessment and plan: Left diabetic foot infection  Left great toe wound infection with concern for osteomyelitis, POA S/p left partial first ray amputation -5/3 Dr. Ardelle Anton Seen the vascular surgery but revascularization was not needed. Underwent first ray amputation by podiatry.  Per podiatry follow-up today 5/9, dressing to remain clean dry and intact, changes outpatient.  Weightbearing as tolerated to heel in surgical shoe.  MRSA bacteremia Vegetation on pacemaker lead -TEE 09/07/22 showed a vegetation on RV lead.  Underwent lead extraction on 09/08/2022 -Repeat blood culture done on 09/06/2022 has not grown any organisms. -Results of blood culture done on 09/09/2022 is pending.   -Currently on IV daptomycin per ID -PICC line placed on 09/10/2022.   09/13/2022: Complete course of antibiotics (6 weeks from 09/08/2022).  Pacemaker has been removed.    Paroxysmal A-fib  H/o SSS s/p PPM PTA on Toprol, amiodarone, Eliquis.   Currently in sinus bradycardia post lead extraction. Continue amiodarone.  Toprol on hold. Resuming apixaban today Cardiology to place cardia monitor prior to discharge  AKI on CKD 4 Acute exacerbation of chronic diastolic CHF Baseline creatinine less than 2.  Elevated 2.85.  She was started on IV fluid.  Worsened to peak at 3.31.  Gradually improving creatinine. Echo 5/5 with LVEF 60 to 65%, normal right ventricular size and function. PTA Toprol, amlodipine, torsemide, losartan Toprol on hold because of bradycardia Torsemide and losartan were hold because of AKI. Torsemide re-started 5/11 when cxr  showed pulm edema Currently on amlodipine 10 mg daily for blood pressure control.  Acute on chronic  respiratory failure with hypoxia On 3 L oxygen at home.  w remains on 3 to 4 L by nasal cannula. Continue scheduled Breztri inhaler, PRN bronchodilators, Mucinex and incentive spirometry  CAD/HLD Continue statin.   Anticoagulation plan as above.  Acute on chronic anemia Iron deficiency anemia Baseline hemoglobin above 9.   Hemoglobin was low at 7.7 on 5/4.  Received 1 unit of PRBC.  Hemoglobin stable over 8.  Type 2 diabetes with hyperglycemia Diabetic polyneuropathy A1c 6.8 on 09/04/2022 Uncontrolled, will up-titrate semglee from 20 to 25 bid, increase ssi to 0-20, and add meal-time novolog 4  Hypothyroidism Synthroid  Hypertension Bp mild eleevation today - cont amlod, torsemide  Obesity  Body mass index is 38.96 kg/m. Patient has been advised to make an attempt to improve diet and exercise patterns to aid in weight loss.  Impaired mobility PT eval obtained.  SNF recommended.  Goals of care   Code Status: DNR    DVT prophylaxis: Heparin    Antimicrobials: IV daptomycin per ID.  PICC line placed 5/9 Fluid: None Consultants: Podiatry, vascular surgery, ID, cardiology Family Communication: Husband at bedside  Status: Inpatient Level of care:  Telemetry Surgical   Patient from: Home Anticipated d/c to: Pending clinical course Needs to continue in-hospital care:  Pending snf placement   Diet:  Diet Order             Diet Carb Modified Fluid consistency: Thin; Room service appropriate? Yes  Diet effective now                   Scheduled Meds:  sodium chloride   Intravenous Once   amiodarone  200 mg Oral Daily   amLODipine  10 mg Oral Daily   atorvastatin  40 mg Oral Daily   Budeson-Glycopyrrol-Formoterol  2 puff Inhalation BID   Chlorhexidine Gluconate Cloth  6 each Topical Daily   vitamin D3  1,000 Units Oral Q lunch   cyanocobalamin  1,000 mcg Oral Q lunch   fluticasone  2 spray Each Nare Daily   gabapentin  200 mg Oral TID   guaiFENesin  600 mg  Oral BID   insulin aspart  0-15 Units Subcutaneous TID WC   insulin aspart  0-5 Units Subcutaneous QHS   insulin glargine-yfgn  20 Units Subcutaneous BID   levothyroxine  200 mcg Oral QAC breakfast   loratadine  10 mg Oral Daily   mupirocin ointment   Nasal BID   sodium chloride flush  10-40 mL Intracatheter Q12H   torsemide  40 mg Oral Daily    PRN meds: acetaminophen, acetaminophen, albuterol, guaiFENesin-dextromethorphan, hydrALAZINE, HYDROcodone-acetaminophen, HYDROmorphone (DILAUDID) injection, melatonin, ondansetron (ZOFRAN) IV, phenol, polyethylene glycol, sodium chloride flush   Infusions:   DAPTOmycin (CUBICIN) 600 mg in sodium chloride 0.9 % IVPB 600 mg (09/13/22 1548)    Antimicrobials: Anti-infectives (From admission, onward)    Start     Dose/Rate Route Frequency Ordered Stop   09/11/22 1306  ceFAZolin (ANCEF) 2-4 GM/100ML-% IVPB       Note to Pharmacy: Zadie Cleverly B: cabinet override      09/11/22 1306 09/11/22 1330   09/11/22 1306  sodium chloride 0.9 % with gentamicin (GARAMYCIN) ADS Med       Note to Pharmacy: Zadie Cleverly B: cabinet override      09/11/22 1306 09/11/22 1330   09/11/22 0145  gentamicin (GARAMYCIN) 80  mg in sodium chloride 0.9 % 500 mL irrigation  Status:  Discontinued        80 mg Irrigation On call 09/11/22 0055 09/11/22 1400   09/11/22 0145  ceFAZolin (ANCEF) IVPB 2g/100 mL premix  Status:  Discontinued        2 g 200 mL/hr over 30 Minutes Intravenous On call 09/11/22 0055 09/11/22 1400   09/08/22 0956  ceFAZolin (ANCEF) 2-4 GM/100ML-% IVPB  Status:  Discontinued       Note to Pharmacy: [facility] [place]: cabinet override      09/08/22 0956 09/08/22 1002   09/08/22 0700  gentamicin (GARAMYCIN) 80 mg in sodium chloride 0.9 % 500 mL irrigation        80 mg Irrigation To Short Stay 09/08/22 0654 09/08/22 1519   09/08/22 0700  ceFAZolin (ANCEF) IVPB 2g/100 mL premix        2 g 200 mL/hr over 30 Minutes Intravenous To Short Stay 09/08/22 0654  09/08/22 1612   09/07/22 1600  DAPTOmycin (CUBICIN) 600 mg in sodium chloride 0.9 % IVPB        8 mg/kg  74.4 kg (Adjusted) 124 mL/hr over 30 Minutes Intravenous Every 48 hours 09/07/22 1317 10/20/22 2359   09/07/22 1000  vancomycin (VANCOREADY) IVPB 1500 mg/300 mL  Status:  Discontinued        1,500 mg 150 mL/hr over 120 Minutes Intravenous every 72 hours 09/04/22 0105 09/05/22 1019   09/06/22 1200  vancomycin (VANCOREADY) IVPB 750 mg/150 mL  Status:  Discontinued        750 mg 150 mL/hr over 60 Minutes Intravenous Every 48 hours 09/05/22 1019 09/07/22 1317   09/04/22 2200  metroNIDAZOLE (FLAGYL) IVPB 500 mg  Status:  Discontinued       See Hyperspace for full Linked Orders Report.   500 mg 100 mL/hr over 60 Minutes Intravenous Every 12 hours 09/04/22 1022 09/07/22 0830   09/04/22 1100  cefTRIAXone (ROCEPHIN) 2 g in sodium chloride 0.9 % 100 mL IVPB  Status:  Discontinued       See Hyperspace for full Linked Orders Report.   2 g 200 mL/hr over 30 Minutes Intravenous Every 24 hours 09/04/22 1022 09/07/22 0830   09/04/22 0200  vancomycin (VANCOREADY) IVPB 2000 mg/400 mL        2,000 mg 200 mL/hr over 120 Minutes Intravenous  Once 09/04/22 0105 09/04/22 0626   09/03/22 2230  cefTRIAXone (ROCEPHIN) 2 g in sodium chloride 0.9 % 100 mL IVPB  Status:  Discontinued       See Hyperspace for full Linked Orders Report.   2 g 200 mL/hr over 30 Minutes Intravenous Every 24 hours 09/03/22 2218 09/04/22 1022   09/03/22 2230  metroNIDAZOLE (FLAGYL) IVPB 500 mg  Status:  Discontinued       See Hyperspace for full Linked Orders Report.   500 mg 100 mL/hr over 60 Minutes Intravenous Every 12 hours 09/03/22 2218 09/04/22 1022       Nutritional status:  Body mass index is 38.96 kg/m.          Objective: Vitals:   09/14/22 0915 09/14/22 1130  BP: (!) 178/54 (!) 152/52  Pulse: 65 63  Resp: 19 18  Temp: (!) 97.5 F (36.4 C) 97.7 F (36.5 C)  SpO2: 98% 99%    Intake/Output Summary  (Last 24 hours) at 09/14/2022 1451 Last data filed at 09/14/2022 1409 Gross per 24 hour  Intake 62 ml  Output 2300 ml  Net -2238  ml   Filed Weights   09/11/22 0318 09/12/22 0612 09/13/22 0500  Weight: 105.5 kg 106.9 kg 106.2 kg   Weight change:  Body mass index is 38.96 kg/m.   Physical Exam: General exam: Patient is obese.  Not in any distress.  Patient is awake and alert.   HEENT: Patient is pale.  No jaundice.   Lungs: Expiratory wheeze.  Decreased air entry. CVS: S1-S2, with ejection systolic murmur.  R GI/Abd: Abdomen is obese, soft and nontender.  CNS: Awake and alert.  Patient moves all extremities.    Data Review: I have personally reviewed the laboratory data and studies available.  F/u labs ordered Unresulted Labs (From admission, onward)     Start     Ordered   09/13/22 0500  CBC with Differential/Platelet  Tomorrow morning,   R       Question:  Specimen collection method  Answer:  Unit=Unit collect   09/12/22 1702   09/11/22 0056  Surgical PCR screen  (Screening)  Once,   R        09/11/22 0055   09/08/22 0500  CK  Weekly,   R      09/07/22 1317            Total time spent in review of labs and imaging, patient evaluation, formulation of plan, documentation and communication with family:    Time spent: 35 minutes.  Signed, Silvano Bilis, MD [facility] 09/14/2022

## 2022-09-14 NOTE — Progress Notes (Signed)
ANTICOAGULATION CONSULT NOTE - Initial Consult  Pharmacy Consult for apixaban Indication: atrial fibrillation  Allergies  Allergen Reactions   Dulaglutide Nausea Only, Other (See Comments) and Cough    TRULICITY made the patient sick to her stomach (only) several days after using it   Oxycodone Itching and Other (See Comments)    Pt still takes this med even thought it causes itching   Trelegy Ellipta [Fluticasone-Umeclidin-Vilant] Nausea And Vomiting, Other (See Comments) and Cough    Made the patient sick to her stomach (only) several days after using it     Patient Measurements: Height: 5\' 5"  (165.1 cm) Weight: 106.2 kg (234 lb 2.1 oz) IBW/kg (Calculated) : 57 Heparin Dosing Weight:   Vital Signs: Temp: 97.7 F (36.5 C) (05/13 1130) Temp Source: Oral (05/13 1130) BP: 152/52 (05/13 1130) Pulse Rate: 63 (05/13 1130)  Labs: Recent Labs    09/12/22 0351 09/13/22 0035 09/13/22 0305  HGB 8.3* 8.4*  --   HCT 26.6* 27.0*  --   PLT 436* 428*  --   CREATININE 2.17*  --  2.19*    Estimated Creatinine Clearance: 24.4 mL/min (A) (by C-G formula based on SCr of 2.19 mg/dL (H)).   Medical History: Past Medical History:  Diagnosis Date   Allergy    Anxiety    Arthritis    CAD (coronary artery disease)    Mild per remote cath in 2006, /    Nuclear, January, 2013, no significant abnormality,   Carotid artery disease (HCC)    Doppler, January, 2013, 0 39% RIC A., 40-59% LICA, stable   Cataract    CHF (congestive heart failure) (HCC)    Chronic kidney disease    Complication of anesthesia    wakes up "mean"   COPD 02/16/2007   Intolerant of Spiriva, tudorza Intolerant of Trelegy    COPD (chronic obstructive pulmonary disease) (HCC)    Coronary atherosclerosis 11/24/2008   Catheterization 2006, nonobstructive coronary disease   //   nuclear 2013, low risk     Diabetes mellitus, type 2 (HCC)    Diverticula of colon    DIVERTICULOSIS OF COLON 03/23/2007   Qualifier:  Diagnosis of  By: Craige Cotta MD, Vineet     Dizziness    occasional   Dizzy spells 05/04/2011   DM (diabetes mellitus) (HCC) 03/23/2007   Qualifier: Diagnosis of  By: Craige Cotta MD, Vineet     Dyspnea    On exertion   Ejection fraction    Essential hypertension 02/16/2007   Qualifier: Diagnosis of  By: Earlene Plater CMA, Tammy     G E R D 03/23/2007   Qualifier: Diagnosis of  By: Craige Cotta MD, Vineet     Gall bladder disease 04/28/2016   Gastritis and gastroduodenitis    GERD (gastroesophageal reflux disease)    Goiter    hx of   Headache(784.0)    migraine   Hyperlipidemia    Hypertension    Hypothyroidism    HYPOTHYROIDISM, POSTSURGICAL 06/04/2008   Qualifier: Diagnosis of  By: Everardo All MD, Sean A    Left ventricular hypertrophy    Leg edema    occasional swelling   Leg swelling 12/09/2018   Myofascial pain syndrome, cervical 01/06/2018   Obesity    OBESITY 11/24/2008   Qualifier: Diagnosis of  By: Vikki Ports     OBSTRUCTIVE SLEEP APNEA 02/16/2007   Auto CPAP 09/03/13 to 10/02/13 >> used on 30 of 30 nights with average 6 hrs and 3 min.  Average AHI is  3.1 with median CPAP 5 cm H2O and 95 th percentile CPAP 6 cm H2O.     Occipital neuralgia of right side 01/06/2018   OSA (obstructive sleep apnea)    cpap setting of 12   Pain in joint of right hip 01/21/2018   Palpitations 01/21/2011   48 hour Holter, 2015, scattered PACs and PVCs.    Paroxysmal atrial fibrillation (HCC)    Pneumonia    PONV (postoperative nausea and vomiting)    severe after every surgery   Pulmonary hypertension, secondary    RHINITIS 07/21/2010        RUQ abdominal pain    S/P left TKA 07/27/2011   Sinus node dysfunction (HCC) 11/08/2015   Sleep apnea    Spinal stenosis of cervical region 11/07/2014   Urinary frequency 12/09/2018   UTI (urinary tract infection) 08/15/2019   per pt report   VENTRICULAR HYPERTROPHY, LEFT 11/24/2008   Qualifier: Diagnosis of  By: Vikki Ports      Medications:  Medications  Prior to Admission  Medication Sig Dispense Refill Last Dose   acetaminophen (TYLENOL) 500 MG tablet Take 1,000-1,500 mg by mouth every 8 (eight) hours as needed for mild pain, moderate pain or headache.   unknown   albuterol (PROVENTIL) (2.5 MG/3ML) 0.083% nebulizer solution Take 3 mLs (2.5 mg total) by nebulization every 2 (two) hours as needed for wheezing or shortness of breath. 75 mL 5 Past Week   albuterol (VENTOLIN HFA) 108 (90 Base) MCG/ACT inhaler Inhale 2 puffs into the lungs every 6 (six) hours as needed for wheezing or shortness of breath. 3 each 3 Past Week   allopurinol (ZYLOPRIM) 100 MG tablet Take 100 mg by mouth 2 (two) times daily.   09/03/2022   amiodarone (PACERONE) 200 MG tablet Take 1 tablet (200 mg total) by mouth daily. 90 tablet 3 09/03/2022   amLODipine (NORVASC) 5 MG tablet Take 1 tablet (5 mg total) by mouth daily. 90 tablet 3 09/03/2022   ascorbic acid (VITAMIN C) 500 MG tablet Take 500 mg by mouth daily with lunch.   Past Week   atorvastatin (LIPITOR) 40 MG tablet Take 1 tablet (40 mg total) by mouth daily. 90 tablet 3 09/03/2022   Budeson-Glycopyrrol-Formoterol (BREZTRI AEROSPHERE) 160-9-4.8 MCG/ACT AERO Inhale 2 puffs into the lungs in the morning and at bedtime. 3 each 10 09/03/2022   Carboxymethylcellulose Sodium (ARTIFICIAL TEARS OP) Place 1 drop into both eyes daily as needed (dry eyes).   unknown   chlorthalidone (HYGROTON) 25 MG tablet Take 25 mg by mouth daily.   09/03/2022   ELIQUIS 2.5 MG TABS tablet TAKE 1 TABLET TWICE DAILY 180 tablet 3 09/03/2022 at 0800   FARXIGA 10 MG TABS tablet Take 10 mg by mouth daily.   09/03/2022   ferrous sulfate 325 (65 FE) MG tablet Take 1 tablet (325 mg total) by mouth daily with breakfast. 90 tablet 3 09/03/2022   fluticasone (FLONASE) 50 MCG/ACT nasal spray USE 2 SPRAYS NASALLY DAILY (Patient taking differently: Place 1 spray into both nostrils daily as needed for allergies. USE 2 SPRAYS NASALLY DAILY) 48 g 1 unknown   gabapentin (NEURONTIN)  800 MG tablet Take 800 mg by mouth See admin instructions. Take 800 mg by mouth three to four times a day   09/03/2022   HYDROcodone-acetaminophen (NORCO) 10-325 MG tablet Take 1 tablet by mouth See admin instructions. Take 1 tablet by mouth three to four times a day   09/03/2022   insulin aspart (NOVOLOG) 100  UNIT/ML FlexPen Inject 10-17 Units into the skin See admin instructions. Inject 10-17 units into the skin three times a day with meals "as needed," per SLIDING SCALE   Past Week   insulin glargine (LANTUS) 100 UNIT/ML injection Inject 0.55 mLs (55 Units total) into the skin 2 (two) times daily. (Patient taking differently: Inject 25-50 Units into the skin See admin instructions. Inject 25 units subcutaneously daily in the morning. Inject 50 units subcutaneously daily at bedtime.) 10 mL 11 09/03/2022   levothyroxine (SYNTHROID) 200 MCG tablet Take 200 mcg by mouth daily before breakfast.   09/03/2022   losartan (COZAAR) 25 MG tablet Take 25 mg by mouth daily.   09/03/2022   metoprolol succinate (TOPROL-XL) 100 MG 24 hr tablet TAKE 1 TABLET EVERY DAY WITH OR IMMEDIATELY AFTER A MEAL (Patient taking differently: Take 100 mg by mouth daily. TAKE 1 TABLET EVERY DAY WITH OR IMMEDIATELY AFTER A MEAL) 90 tablet 3 09/03/2022 at 0800   nitroGLYCERIN (NITROSTAT) 0.4 MG SL tablet Place 1 tablet (0.4 mg total) under the tongue every 5 (five) minutes as needed for chest pain. 25 tablet 4 has on hand   torsemide (DEMADEX) 20 MG tablet Take 5 tablets (100 mg total) by mouth daily. (Patient taking differently: Take 80 mg by mouth daily.) 135 tablet 3 09/03/2022   vitamin B-12 (CYANOCOBALAMIN) 1000 MCG tablet Take 1,000 mcg by mouth daily with lunch.   Past Week   Vitamin D3 (VITAMIN D) 25 MCG tablet Take 1,000 Units by mouth daily with lunch.   Past Week   Alcohol Swabs (B-D SINGLE USE SWABS REGULAR) PADS       Blood Glucose Calibration (TRUE METRIX LEVEL 1) Low SOLN       collagenase (SANTYL) 250 UNIT/GM ointment Apply 1  Application topically daily. (Patient not taking: Reported on 09/04/2022) 15 g 0 Completed Course   metolazone (ZAROXOLYN) 5 MG tablet Take one tablet by mouth daily for 3 days 30 minutes before taking your torsemide. (Patient not taking: Reported on 09/04/2022) 3 tablet 0 Completed Course   naloxone (NARCAN) nasal spray 4 mg/0.1 mL Place 4 mg into the nose once as needed for opioid reversal.      PRESCRIPTION MEDICATION CPAP- At bedtime and as needed during any time of rest      TRUE METRIX BLOOD GLUCOSE TEST test strip       TRUEplus Lancets 28G MISC       Scheduled:   sodium chloride   Intravenous Once   amiodarone  200 mg Oral Daily   amLODipine  10 mg Oral Daily   apixaban  2.5 mg Oral BID   atorvastatin  40 mg Oral Daily   Budeson-Glycopyrrol-Formoterol  2 puff Inhalation BID   Chlorhexidine Gluconate Cloth  6 each Topical Daily   vitamin D3  1,000 Units Oral Q lunch   cyanocobalamin  1,000 mcg Oral Q lunch   fluticasone  2 spray Each Nare Daily   gabapentin  200 mg Oral TID   guaiFENesin  600 mg Oral BID   insulin aspart  0-20 Units Subcutaneous TID WC   insulin aspart  0-5 Units Subcutaneous QHS   insulin glargine-yfgn  25 Units Subcutaneous BID   levothyroxine  200 mcg Oral QAC breakfast   loratadine  10 mg Oral Daily   mupirocin ointment   Nasal BID   sodium chloride flush  10-40 mL Intracatheter Q12H   torsemide  40 mg Oral Daily   Infusions:   DAPTOmycin (  CUBICIN) 600 mg in sodium chloride 0.9 % IVPB 600 mg (09/13/22 1548)    Assessment: Apixaban has been on hold for lead extraction. Ok to resume today.  Age>80, wt>60, scr>1.5  Goal of Therapy:  Monitor platelets by anticoagulation protocol: Yes   Plan:   Resume apixaban 2.5mg  PO BID Rx will follow peripherally  Ulyses Southward, PharmD, BCIDP, AAHIVP, CPP Infectious Disease Pharmacist 09/14/2022 3:00 PM

## 2022-09-14 NOTE — Progress Notes (Signed)
PT Cancellation Note  Patient Details Name: AMISADAI BIGGERSTAFF MRN: [id number] DOB: [birth year]   Cancelled Treatment:    Reason Eval/Treat Not Completed: (P) Fatigue/lethargy limiting ability to participate (pt sleeping deeply when initially attempted, spouse requesting PTA return later in day if time to work with her as she had a difficult evening/morning.) Will continue efforts per PT plan of care as schedule permits.   Dorathy Kinsman Saif Peter 09/14/2022, 2:57 PM

## 2022-09-14 NOTE — Plan of Care (Signed)
  Problem: Skin Integrity: Goal: Risk for impaired skin integrity will decrease Outcome: Progressing   Problem: Health Behavior/Discharge Planning: Goal: Ability to manage health-related needs will improve Outcome: Not Progressing

## 2022-09-15 ENCOUNTER — Inpatient Hospital Stay (HOSPITAL_BASED_OUTPATIENT_CLINIC_OR_DEPARTMENT_OTHER)
Admit: 2022-09-15 | Discharge: 2022-09-15 | Disposition: A | Discharge status: Home / Self Care | Payer: Medicare HMO | Attending: Pulmonary Disease | Admitting: Pulmonary Disease

## 2022-09-15 DIAGNOSIS — Z7401 Bed confinement status: Secondary | ICD-10-CM | POA: Diagnosis not present

## 2022-09-15 DIAGNOSIS — I739 Peripheral vascular disease, unspecified: Secondary | ICD-10-CM | POA: Diagnosis not present

## 2022-09-15 DIAGNOSIS — N179 Acute kidney failure, unspecified: Secondary | ICD-10-CM | POA: Diagnosis not present

## 2022-09-15 DIAGNOSIS — I495 Sick sinus syndrome: Secondary | ICD-10-CM | POA: Diagnosis not present

## 2022-09-15 DIAGNOSIS — S92414A Nondisplaced fracture of proximal phalanx of right great toe, initial encounter for closed fracture: Secondary | ICD-10-CM | POA: Diagnosis not present

## 2022-09-15 DIAGNOSIS — M8600 Acute hematogenous osteomyelitis, unspecified site: Secondary | ICD-10-CM | POA: Diagnosis not present

## 2022-09-15 DIAGNOSIS — E039 Hypothyroidism, unspecified: Secondary | ICD-10-CM | POA: Diagnosis not present

## 2022-09-15 DIAGNOSIS — G4733 Obstructive sleep apnea (adult) (pediatric): Secondary | ICD-10-CM | POA: Diagnosis not present

## 2022-09-15 DIAGNOSIS — E11628 Type 2 diabetes mellitus with other skin complications: Secondary | ICD-10-CM | POA: Diagnosis not present

## 2022-09-15 DIAGNOSIS — L089 Local infection of the skin and subcutaneous tissue, unspecified: Secondary | ICD-10-CM | POA: Diagnosis not present

## 2022-09-15 DIAGNOSIS — M86072 Acute hematogenous osteomyelitis, left ankle and foot: Secondary | ICD-10-CM | POA: Diagnosis not present

## 2022-09-15 DIAGNOSIS — R531 Weakness: Secondary | ICD-10-CM | POA: Diagnosis not present

## 2022-09-15 DIAGNOSIS — I2510 Atherosclerotic heart disease of native coronary artery without angina pectoris: Secondary | ICD-10-CM | POA: Diagnosis not present

## 2022-09-15 DIAGNOSIS — I480 Paroxysmal atrial fibrillation: Secondary | ICD-10-CM | POA: Diagnosis not present

## 2022-09-15 DIAGNOSIS — R0902 Hypoxemia: Secondary | ICD-10-CM | POA: Diagnosis not present

## 2022-09-15 DIAGNOSIS — M869 Osteomyelitis, unspecified: Secondary | ICD-10-CM | POA: Diagnosis not present

## 2022-09-15 LAB — CBC
HCT: 27.5 % — ABNORMAL LOW (ref 36.0–46.0)
Hemoglobin: 8.6 g/dL — ABNORMAL LOW (ref 12.0–15.0)
MCH: 29.3 pg (ref 26.0–34.0)
MCHC: 31.3 g/dL (ref 30.0–36.0)
MCV: 93.5 fL (ref 80.0–100.0)
Platelets: 495 10*3/uL — ABNORMAL HIGH (ref 150–400)
RBC: 2.94 MIL/uL — ABNORMAL LOW (ref 3.87–5.11)
RDW: 14.6 % (ref 11.5–15.5)
WBC: 13.2 10*3/uL — ABNORMAL HIGH (ref 4.0–10.5)
nRBC: 0 % (ref 0.0–0.2)

## 2022-09-15 LAB — GLUCOSE, CAPILLARY
Glucose-Capillary: 205 mg/dL — ABNORMAL HIGH (ref 70–99)
Glucose-Capillary: 225 mg/dL — ABNORMAL HIGH (ref 70–99)

## 2022-09-15 LAB — CK: Total CK: 66 U/L (ref 38–234)

## 2022-09-15 LAB — BASIC METABOLIC PANEL
Anion gap: 11 (ref 5–15)
BUN: 56 mg/dL — ABNORMAL HIGH (ref 8–23)
CO2: 26 mmol/L (ref 22–32)
Calcium: 8.7 mg/dL — ABNORMAL LOW (ref 8.9–10.3)
Chloride: 99 mmol/L (ref 98–111)
Creatinine, Ser: 1.94 mg/dL — ABNORMAL HIGH (ref 0.44–1.00)
GFR, Estimated: 26 mL/min — ABNORMAL LOW (ref >60)
Glucose, Bld: 227 mg/dL — ABNORMAL HIGH (ref 70–99)
Potassium: 4.5 mmol/L (ref 3.5–5.1)
Sodium: 136 mmol/L (ref 135–145)

## 2022-09-15 MED ORDER — GABAPENTIN 100 MG PO CAPS: 200.0000 mg | ORAL_CAPSULE | Freq: Three times a day (TID) | ORAL | Status: DC

## 2022-09-15 MED ORDER — AMLODIPINE BESYLATE 5 MG PO TABS: 10.0000 mg | ORAL_TABLET | Freq: Every day | ORAL | Status: DC

## 2022-09-15 MED ORDER — TORSEMIDE 20 MG PO TABS: 40.0000 mg | ORAL_TABLET | Freq: Every day | ORAL | Status: DC

## 2022-09-15 MED ORDER — MELATONIN 5 MG PO TABS: 5.0000 mg | ORAL_TABLET | Freq: Every evening | ORAL | 0 refills | Status: DC | PRN

## 2022-09-15 MED ORDER — POLYETHYLENE GLYCOL 3350 17 G PO PACK: 17.0000 g | PACK | Freq: Every day | ORAL | 0 refills | Status: DC | PRN

## 2022-09-15 MED ORDER — GUAIFENESIN-DM 100-10 MG/5ML PO SYRP: 5.0000 mL | ORAL_SOLUTION | ORAL | 0 refills | Status: AC | PRN

## 2022-09-15 MED ORDER — DAPTOMYCIN IV (FOR PTA / DISCHARGE USE ONLY): 600.0000 mg | INTRAVENOUS | 0 refills | Status: DC

## 2022-09-15 MED ORDER — LORATADINE 10 MG PO TABS: 10.0000 mg | ORAL_TABLET | Freq: Every day | ORAL | Status: DC

## 2022-09-15 MED ORDER — HYDROCODONE-ACETAMINOPHEN 10-325 MG PO TABS: 1.0000 | ORAL_TABLET | Freq: Four times a day (QID) | ORAL | 0 refills | Status: DC | PRN

## 2022-09-15 NOTE — Discharge Summary (Signed)
Physician Discharge Summary  Kayla [name] UEA:[id number] DOB: 09-20-1940 DOA: 09/03/2022  PCP: Ailene Ravel, MD  Admit date: 09/03/2022 Discharge date: 09/15/2022  Admitted From: Home  Discharge disposition: Skilled nursing facility   Recommendations for Outpatient Follow-Up:   Follow up with your primary care provider at the skilled nursing facility in 3 to 5 days. Follow-up with podiatry in 1 week, Dr. Thedore Mins infectious disease on 10/07/2022 and with electrophysiology in 2 weeks. Weightbearing as tolerated on surgical shoe. Continue antibiotics with PICC line until 10/26/2022.  Discharge Diagnosis:   Principal Problem:   Diabetic foot infection (HCC) Active Problems:   Acute hematogenous osteomyelitis of left foot (HCC)   Pyogenic inflammation of bone (HCC)   Closed nondisplaced fracture of proximal phalanx of right great toe  Discharge Condition: Improved.  Diet recommendation: Low sodium, heart healthy.  Carbohydrate-modified.    Wound care: Surgical site care.  Code status: Full.  History of Present Illness:   Patient is an 82 year old female past medical history significant for obesity, OSA, diabetes mellitus type 2, hypertension, hyperlipidemia, coronary artery disease, congestive heart failure, paroxysmal atrial fibrillation on Eliquis, SSS s/p PPM, carotid artery disease, CKD, chronic anemia, COPD, anxiety/depression, GERD, hypothyroidism, diabetic polyneuropathy, chronic venous insufficiency, h/o left toe amputation was sent to the hospital by her podiatrist on 09/03/2022 with concern for left great toe infection and osteomyelitis.  MRI of the foot showed acute osteomyelitis.  Patient was started on broad-spectrum IV antibiotic.  Patient underwent left partial first ray amputation on 09/04/2022 and wound culture blood culture was positive for MRSA.  Further workup revealed likely pacemaker lead infection and was extracted on 09/08/2022.  Plan is to continue with 6 weeks  of IV antibiotics starting 09/08/2022 with daptomycin.     Hospital Course:   Following conditions were addressed during hospitalization as listed below,  Left diabetic foot infection  Left great toe wound infection with concern for osteomyelitis, POA S/p left partial first ray amputation -5/3 Dr. Ardelle Anton Patient was seen by vascular surgery but no revascularization was planned.  Underwent ray amputation by podiatry.  Podiatry recommended dressing and changes as outpatient and weightbearing as tolerated in surgical shoe.  Patient will continue antibiotics on discharge.  Follow-up with podiatry in 1 week.  MRSA bacteremia Vegetation on pacemaker lead TEE 09/07/22 showed  vegetation on RV lead.  Underwent lead extraction on 09/08/2022 repeat blood cultures on 09/03/2022 without any organisms..  Repeat blood culture on 09/09/2022 negative in 5 days. Currently on IV daptomycin per ID. PICC line placed on 09/10/2022.  Plan is to continue to complete the course of antibiotic total of 6 weeks starting from 09/08/2022.  Pacemaker has been removed.  Recommend follow-up with electrophysiology as outpatient.  Will need follow-up with infectious disease in the clinic   Paroxysmal A-fibrillation H/o SSS s/p PPM Patient was on on Toprol, amiodarone, Eliquis.  Continue amiodarone.  Cardiology to place a cardiac monitor prior to discharge. Currently in sinus bradycardia post lead extraction.  Continue to hold beta-blocker on discharge due to bradycardia.  Follow-up with cardiology as outpatient. .  AKI on CKD 4 Acute exacerbation of chronic diastolic CHF Baseline creatinine less than 2.  Creatinine today at 1.9.  Received IV fluids.  Review of 2D echocardiogram on 09/06/2022 with LV ejection fraction of  60 to 65%, normal right ventricular size and function..  Patient was on Toprol amlodipine torsemide losartan from home.  Initially torsemide and losartan were on hold but have been restarted.  Will continue torsemide 40 mg  daily.   Acute on chronic respiratory failure with hypoxia Patient does have baseline oxygen requirement at 3 L/min at home.  Continue Breztri inhaler, PRN bronchodilators, Mucinex and incentive spirometry.  Currently stable.   Coronary artery disease, hyperlipidemia Continue statin, Eliquis.   Acute on chronic anemia Iron deficiency anemia Baseline hemoglobin above 9.  Patient had low hemoglobin of 7.7 on 09/05/2022 and received 1 unit of packed RBC.  Currently hemoglobin has been stable.Latest hemoglobin of 8.6.  Recommend periodic follow-up   Type 2 diabetes with hyperglycemia Diabetic polyneuropathy Hemoglobin A1c 6.8 on 09/04/2022.  Will resume her home insulin regimen on discharge.  Hypothyroidism Continue Synthroid   Hypertension Continue amlodipine, torsemide from home.  Will continue to monitor closely.  Continue as needed clonidine and hydralazine.   Obesity  Body mass index is 38.96 kg/m.  Would benefit from weight loss as outpatient..   Impaired mobility/debility, deconditioning Physical therapy has recommended skilled nursing facility placement.  Disposition.  At this time, patient is stable for disposition to skilled nursing facility with outpatient podiatry, ID, electrophysiology follow-up.  Spoke with the patient's spouse at bedside.  Medical Consultants:   Podiatry,  vascular surgery,  ID,  cardiology   Procedures:    PICC line 5/9  TEE Pacemaker lead removal. S/p left partial first ray amputation on 09/04/22  Subjective:   Today, patient was seen and examined at bedside.  Feels okay.  Denies any dizziness lightheadedness chest pain has mild shortness of breath but at baseline.  Inquiring about getting discharged.  Patient's family at bedside.  Discharge Exam:   Vitals:   09/15/22 0411 09/15/22 0816  BP: (!) 154/57 (!) 160/70  Pulse: 65 85  Resp: 20 18  Temp: 97.8 F (36.6 C) 97.6 F (36.4 C)  SpO2: 95% 94%   Vitals:   09/14/22 1956 09/15/22  0145 09/15/22 0411 09/15/22 0816  BP: (!) 167/65  (!) 154/57 (!) 160/70  Pulse: 67 62 65 85  Resp: 20 18 20 18   Temp: 97.9 F (36.6 C)  97.8 F (36.6 C) 97.6 F (36.4 C)  TempSrc: Oral  Oral Oral  SpO2: 98% 99% 95% 94%  Weight:      Height:       Body mass index is 38.96 kg/m.  General: Alert awake, not in obvious distress, on nasal cannula oxygen HENT: pupils equally reacting to light,  No scleral pallor or icterus noted. Oral mucosa is moist.  Chest:    Diminished breath sounds bilaterally. CVS: S1 &S2 heard. No murmur.  Regular rate and rhythm. Abdomen: Soft, nontender, nondistended.  Bowel sounds are heard.   Extremities: No cyanosis, clubbing or edema.  Peripheral pulses are palpable.  Ray amputation of the first digit left.Marland Kitchen Psych: Alert, awake and oriented, normal mood CNS:  No cranial nerve deficits.  Power equal in all extremities.   Skin: Warm and dry.  No rashes noted.  The results of significant diagnostics from this hospitalization (including imaging, microbiology, ancillary and laboratory) are listed below for reference.     Diagnostic Studies:   No results found.   Labs:   Basic Metabolic Panel: Recent Labs  Lab 09/09/22 0557 09/10/22 1146 09/11/22 0510 09/12/22 0351 09/13/22 0035 09/13/22 0305 09/15/22 0515  NA 133* 136 134* 134*  --  133* 136  K 4.7 3.6 4.4 4.3  --  4.7 4.5  CL 99 105 101 100  --  99 99  CO2 24 21* 24 23  --  24 26  GLUCOSE 199* 332* 245* 194*  --  219* 227*  BUN 42* 42* 45* 47*  --  53* 56*  CREATININE 2.58* 2.39* 2.34* 2.17*  --  2.19* 1.94*  CALCIUM 8.5* 7.2* 8.8* 9.0  --  8.9 8.7*  MG 2.0  --   --   --  2.2  --   --   PHOS  --   --   --   --   --  4.4  --    GFR Estimated Creatinine Clearance: 27.5 mL/min (A) (by C-G formula based on SCr of 1.94 mg/dL (H)). Liver Function Tests: Recent Labs  Lab 09/13/22 0305  ALBUMIN 1.6*   No results for input(s): "LIPASE", "AMYLASE" in the last 168 hours. No results for  input(s): "AMMONIA" in the last 168 hours. Coagulation profile No results for input(s): "INR", "PROTIME" in the last 168 hours.  CBC: Recent Labs  Lab 09/10/22 1146 09/11/22 0510 09/12/22 0351 09/13/22 0035 09/15/22 0515  WBC 16.3* 17.4* 13.6* 12.4* 13.2*  NEUTROABS  --  14.7* 10.6* 9.7*  --   HGB 8.8* 8.4* 8.3* 8.4* 8.6*  HCT 27.8* 25.9* 26.6* 27.0* 27.5*  MCV 96.5 95.2 96.0 96.8 93.5  PLT 430* 398 436* 428* 495*   Cardiac Enzymes: Recent Labs  Lab 09/15/22 0515  CKTOTAL 66   BNP: Invalid input(s): "POCBNP" CBG: Recent Labs  Lab 09/14/22 0613 09/14/22 1131 09/14/22 1651 09/14/22 2105 09/15/22 0619  GLUCAP 266* 311* 258* 322* 205*   D-Dimer No results for input(s): "DDIMER" in the last 72 hours. Hgb A1c No results for input(s): "HGBA1C" in the last 72 hours. Lipid Profile No results for input(s): "CHOL", "HDL", "LDLCALC", "TRIG", "CHOLHDL", "LDLDIRECT" in the last 72 hours. Thyroid function studies No results for input(s): "TSH", "T4TOTAL", "T3FREE", "THYROIDAB" in the last 72 hours.  Invalid input(s): "FREET3" Anemia work up No results for input(s): "VITAMINB12", "FOLATE", "FERRITIN", "TIBC", "IRON", "RETICCTPCT" in the last 72 hours. Microbiology Recent Results (from the past 240 hour(s))  Culture, blood (Routine X 2) w Reflex to ID Panel     Status: None   Collection Time: 09/06/22 11:00 AM   Specimen: BLOOD  Result Value Ref Range Status   Specimen Description BLOOD SITE NOT SPECIFIED  Final   Special Requests   Final    BOTTLES DRAWN AEROBIC ONLY Blood Culture results may not be optimal due to an inadequate volume of blood received in culture bottles   Culture   Final    NO GROWTH 5 DAYS Performed at [facility], [address] [place], [place] [address]    Report Status 09/11/2022 FINAL  Final  Culture, blood (Routine X 2) w Reflex to ID Panel     Status: None   Collection Time: 09/06/22 11:01 AM   Specimen: BLOOD  Result Value Ref Range  Status   Specimen Description BLOOD SITE NOT SPECIFIED  Final   Special Requests   Final    BOTTLES DRAWN AEROBIC ONLY Blood Culture results may not be optimal due to an inadequate volume of blood received in culture bottles   Culture   Final    NO GROWTH 5 DAYS Performed at [facility], [address] [place], [place] [address]    Report Status 09/11/2022 FINAL  Final  Culture, blood (Routine X 2) w Reflex to ID Panel     Status: None   Collection Time: 09/09/22 12:33 PM   Specimen: BLOOD  Result Value Ref  Range Status   Specimen Description BLOOD BLOOD RIGHT HAND  Final   Special Requests   Final    BOTTLES DRAWN AEROBIC ONLY Blood Culture results may not be optimal due to an inadequate volume of blood received in culture bottles   Culture   Final    NO GROWTH 5 DAYS Performed at [facility], [address] [place], [place] [address]    Report Status 09/14/2022 FINAL  Final  Culture, blood (Routine X 2) w Reflex to ID Panel     Status: None   Collection Time: 09/09/22  1:18 PM   Specimen: BLOOD RIGHT HAND  Result Value Ref Range Status   Specimen Description BLOOD RIGHT HAND  Final   Special Requests   Final    BOTTLES DRAWN AEROBIC ONLY Blood Culture results may not be optimal due to an inadequate volume of blood received in culture bottles   Culture   Final    NO GROWTH 5 DAYS Performed at [facility], [address] [place], [place] [address]    Report Status 09/14/2022 FINAL  Final     Discharge Instructions:   Discharge Instructions     [facility] pharmacist to adjust dose for Vancomycin, Aminoglycosides and other anti-infective therapies as requested by physician.   Complete by: As directed    [facility] to provide Cath Flo 2mg    Complete by: As directed    Administer for PICC line occlusion and as ordered by physician for other access device issues.   Anaphylaxis Kit: Provided to treat any anaphylactic reaction to  the medication being provided to the patient if First Dose or when requested by physician   Complete by: As directed    Epinephrine 1mg /ml vial / amp: Administer 0.3mg  (0.79ml) subcutaneously once for moderate to severe anaphylaxis, nurse to call physician and pharmacy when reaction occurs and call 911 if needed for immediate care   Diphenhydramine 50mg /ml IV vial: Administer 25-50mg  IV/IM PRN for first dose reaction, rash, itching, mild reaction, nurse to call physician and pharmacy when reaction occurs   Sodium Chloride 0.9% NS IV: Administer if needed for hypovolemic blood pressure drop or as ordered by physician after call to physician with anaphylactic reaction   Call MD for:  severe uncontrolled pain   Complete by: As directed    Call MD for:  temperature >100.4   Complete by: As directed    Change dressing on IV access line weekly and PRN   Complete by: As directed    Diet - low sodium heart healthy   Complete by: As directed    Diet Carb Modified   Complete by: As directed    Discharge instructions   Complete by: As directed    Follow-up with your primary care provider at the skilled nursing facility in 3 to 5 days. Check blood work at that time.  Seek medical attention for worsening symptoms.   Flush IV access with Sodium Chloride 0.9% and Heparin 10 units/ml or 100 units/ml   Complete by: As directed    Home infusion instructions - [facility]   Complete by: As directed    Instructions: Flush IV access with Sodium Chloride 0.9% and Heparin 10units/ml or 100units/ml   Change dressing on IV access line: Weekly and PRN   Instructions Cath Flo 2mg : Administer for PICC Line occlusion and as ordered by physician for other access device   [facility] pharmacist to adjust dose  for: Vancomycin, Aminoglycosides and other anti-infective therapies as requested by physician   Increase activity slowly   Complete by: As directed    Method of administration may be  changed at the discretion of home infusion pharmacist based upon assessment of the patient and/or caregiver's ability to self-administer the medication ordered   Complete by: As directed    No wound care   Complete by: As directed       Allergies as of 09/15/2022       Reactions   Dulaglutide Nausea Only, Other (See Comments), Cough   TRULICITY made the patient sick to her stomach (only) several days after using it   Oxycodone Itching, Other (See Comments)   Pt still takes this med even thought it causes itching   Trelegy Ellipta [fluticasone-umeclidin-vilant] Nausea And Vomiting, Other (See Comments), Cough   Made the patient sick to her stomach (only) several days after using it         Medication List     STOP taking these medications    chlorthalidone 25 MG tablet Commonly known as: HYGROTON   gabapentin 800 MG tablet Commonly known as: NEURONTIN Replaced by: gabapentin 100 MG capsule   metolazone 5 MG tablet Commonly known as: ZAROXOLYN   metoprolol succinate 100 MG 24 hr tablet Commonly known as: TOPROL-XL   Santyl 250 UNIT/GM ointment Generic drug: collagenase       TAKE these medications    acetaminophen 500 MG tablet Commonly known as: TYLENOL Take 1,000-1,500 mg by mouth every 8 (eight) hours as needed for mild pain, moderate pain or headache.   albuterol 108 (90 Base) MCG/ACT inhaler Commonly known as: VENTOLIN HFA Inhale 2 puffs into the lungs every 6 (six) hours as needed for wheezing or shortness of breath.   albuterol (2.5 MG/3ML) 0.083% nebulizer solution Commonly known as: PROVENTIL Take 3 mLs (2.5 mg total) by nebulization every 2 (two) hours as needed for wheezing or shortness of breath.   allopurinol 100 MG tablet Commonly known as: ZYLOPRIM Take 100 mg by mouth 2 (two) times daily.   amiodarone 200 MG tablet Commonly known as: PACERONE Take 1 tablet (200 mg total) by mouth daily.   amLODipine 5 MG tablet Commonly known as:  NORVASC Take 2 tablets (10 mg total) by mouth daily. What changed: how much to take   ARTIFICIAL TEARS OP Place 1 drop into both eyes daily as needed (dry eyes).   ascorbic acid 500 MG tablet Commonly known as: VITAMIN C Take 500 mg by mouth daily with lunch.   atorvastatin 40 MG tablet Commonly known as: LIPITOR Take 1 tablet (40 mg total) by mouth daily.   B-D SINGLE USE SWABS REGULAR Pads   Breztri Aerosphere 160-9-4.8 MCG/ACT Aero Generic drug: Budeson-Glycopyrrol-Formoterol Inhale 2 puffs into the lungs in the morning and at bedtime.   cyanocobalamin 1000 MCG tablet Commonly known as: VITAMIN B12 Take 1,000 mcg by mouth daily with lunch.   daptomycin  IVPB Commonly known as: CUBICIN Inject 600 mg into the vein every other day. 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  First Dose: Yes Last Day of Therapy:  10/20/22 Labs - Once weekly:  CBC/D, BMP, and CPK Labs - Every other week:  ESR and CRP Method of administration: IV Push Method of administration may be changed at the discretion of home infusion pharmacist based upon assessment of the patient and/or caregiver's ability to self-administer the medication ordered.   Eliquis 2.5 MG Tabs tablet Generic drug: apixaban  TAKE 1 TABLET TWICE DAILY   Farxiga 10 MG Tabs tablet Generic drug: dapagliflozin propanediol Take 10 mg by mouth daily.   ferrous sulfate 325 (65 FE) MG tablet Take 1 tablet (325 mg total) by mouth daily with breakfast.   fluticasone 50 MCG/ACT nasal spray Commonly known as: FLONASE USE 2 SPRAYS NASALLY DAILY What changed:  how much to take how to take this when to take this reasons to take this   gabapentin 100 MG capsule Commonly known as: NEURONTIN Take 2 capsules (200 mg total) by mouth 3 (three) times daily. Replaces: gabapentin 800 MG tablet   guaiFENesin-dextromethorphan 100-10 MG/5ML syrup Commonly known as: ROBITUSSIN DM Take 5 mLs by mouth every 4 (four) hours as needed  for up to 5 days for cough (chest congestion).   HYDROcodone-acetaminophen 10-325 MG tablet Commonly known as: NORCO Take 1 tablet by mouth every 6 (six) hours as needed. What changed:  when to take this reasons to take this additional instructions   insulin aspart 100 UNIT/ML FlexPen Commonly known as: NOVOLOG Inject 10-17 Units into the skin See admin instructions. Inject 10-17 units into the skin three times a day with meals "as needed," per SLIDING SCALE   insulin glargine 100 UNIT/ML injection Commonly known as: LANTUS Inject 0.55 mLs (55 Units total) into the skin 2 (two) times daily. What changed:  how much to take when to take this additional instructions   levothyroxine 200 MCG tablet Commonly known as: SYNTHROID Take 200 mcg by mouth daily before breakfast.   loratadine 10 MG tablet Commonly known as: CLARITIN Take 1 tablet (10 mg total) by mouth daily. Start taking on: Sep 16, 2022   losartan 25 MG tablet Commonly known as: COZAAR Take 25 mg by mouth daily.   melatonin 5 MG Tabs Take 1 tablet (5 mg total) by mouth at bedtime as needed.   naloxone 4 MG/0.1ML Liqd nasal spray kit Commonly known as: NARCAN Place 4 mg into the nose once as needed for opioid reversal.   nitroGLYCERIN 0.4 MG SL tablet Commonly known as: NITROSTAT Place 1 tablet (0.4 mg total) under the tongue every 5 (five) minutes as needed for chest pain.   polyethylene glycol 17 g packet Commonly known as: MIRALAX / GLYCOLAX Take 17 g by mouth daily as needed for mild constipation or moderate constipation.   PRESCRIPTION MEDICATION CPAP- At bedtime and as needed during any time of rest   torsemide 20 MG tablet Commonly known as: DEMADEX Take 2 tablets (40 mg total) by mouth daily. What changed: how much to take   True Metrix Blood Glucose Test test strip Generic drug: glucose blood   True Metrix Level 1 Low Soln   TRUEplus Lancets 28G Misc   vitamin D3 25 MCG tablet Commonly  known as: CHOLECALCIFEROL Take 1,000 Units by mouth daily with lunch.               Discharge Care Instructions  (From admission, onward)           Start     Ordered   09/15/22 0000  Change dressing on IV access line weekly and PRN  (Home infusion instructions - [facility] )        09/15/22 0952            Follow-up Information     McDonald, Rachelle Hora, DPM Follow up in 1 week(s).   Specialty: Podiatry Why: foot surgery followup Contact information: [address] [place]  [address] [phone]         Danelle Earthly, MD. Go to.   Specialty: Infectious Diseases Why: 10/07/2022, infectious disease followup Contact information: [address], [address] [place] [address] [phone]         Marinus Maw, MD Follow up in 2 week(s).   Specialty: Cardiology Why: for bradycardia, Contact information: [address] [place] [address] [phone]                  Time coordinating discharge: 39 minutes  Signed:  Arcenia Scarbro  [facility] 09/15/2022, 10:25 AM

## 2022-09-15 NOTE — TOC Transition Note (Signed)
Transition of Care [facility]) - CM/SW Discharge Note   Patient Details  Name: Kayla [name] MRN: [id number] Date of Birth: [birth year]  [facility] CM/SW Contact:  Erin Sons, LCSW Phone Number: 09/15/2022, 11:51 AM   Clinical Narrative:     SNF auth approved 5/13-- 5/15. [id number]  Patient will DC to: [facility] Anticipated DC date: 09/15/22 Family notified: Husband Transport by: Sharin Mons   Per MD patient ready for DC to [facility]. RN, patient, patient's family, and facility notified of DC. Discharge Summary and FL2 sent to facility. RN to call report prior to discharge [phone] ). DC packet on chart. Ambulance transport requested for patient.   CSW will sign off for now as social work intervention is no longer needed. Please consult [place] again if new needs arise.   Final next level of care: Skilled Nursing Facility Barriers to Discharge: No Barriers Identified   Patient Goals and CMS Choice   Choice offered to / list presented to : Patient, Spouse  Discharge Placement                Patient chooses bed at: [facility] [facility] Patient to be transferred to facility by: [facility] Name of family member notified: Husband Patient and family notified of of transfer: 09/15/22  Discharge Plan and Services Additional resources added to the After Visit Summary for     Discharge Planning Services: CM Consult              DME Agency: Other - Comment ([facility]) Date DME Agency Contacted: 09/07/22 Time DME Agency Contacted: 1357 Representative spoke with at DME Agency: Pam HH Arranged: RN HH Agency: [facility] Date [facility] Agency Contacted: 09/07/22 Time HH Agency Contacted: 1358 Representative spoke with at [facility] Agency: Kandee Keen  Social Determinants of Health (SDOH) Interventions SDOH Screenings   Food Insecurity: No Food Insecurity (01/20/2022)  Housing: Low Risk  (01/20/2022)  Transportation Needs: No Transportation Needs  (01/20/2022)  Utilities: Not At Risk (01/20/2022)  Alcohol Screen: Low Risk  (01/31/2021)  Depression (PHQ2-9): Low Risk  (09/30/2021)  Recent Concern: Depression (PHQ2-9) - Medium Risk (08/18/2021)  Financial Resource Strain: Low Risk  (01/31/2021)  Physical Activity: Insufficiently Active (09/30/2021)  Social Connections: Socially Integrated (09/30/2021)  Stress: No Stress Concern Present (09/30/2021)  Tobacco Use: Medium Risk (09/11/2022)     Readmission Risk Interventions    01/22/2022    2:43 PM 07/23/2021   12:10 PM 03/21/2021   12:53 PM  Readmission Risk Prevention Plan  Transportation Screening Complete Complete Complete  PCP or Specialist Appt within 3-5 Days Complete    HRI or Home Care Consult Complete    Social Work Consult for Recovery Care Planning/Counseling Complete    Palliative Care Screening Not Applicable    Medication Review Oceanographer) Complete Complete Complete  HRI or Home Care Consult  Complete   SW Recovery Care/Counseling Consult  Complete   Palliative Care Screening  Not Applicable   Skilled Nursing Facility  Complete

## 2022-09-15 NOTE — Discharge Instructions (Signed)

## 2022-09-15 NOTE — Care Management Important Message (Signed)
Important Message  Patient Details  Name: Kayla [name] MRN: [id number] Date of Birth: 1940/05/15   Medicare Important Message Given:  Yes     Narjis, [place] 09/15/2022, 10:31 AM

## 2022-09-15 NOTE — Progress Notes (Signed)
Report called to [facility] at [facility]

## 2022-09-16 DIAGNOSIS — I495 Sick sinus syndrome: Secondary | ICD-10-CM | POA: Diagnosis not present

## 2022-09-18 ENCOUNTER — Ambulatory Visit: Payer: Medicare HMO | Admitting: Podiatry

## 2022-09-18 DIAGNOSIS — E039 Hypothyroidism, unspecified: Secondary | ICD-10-CM | POA: Diagnosis not present

## 2022-09-18 DIAGNOSIS — I739 Peripheral vascular disease, unspecified: Secondary | ICD-10-CM | POA: Diagnosis not present

## 2022-09-18 DIAGNOSIS — Z89422 Acquired absence of other left toe(s): Secondary | ICD-10-CM

## 2022-09-18 DIAGNOSIS — M869 Osteomyelitis, unspecified: Secondary | ICD-10-CM | POA: Diagnosis not present

## 2022-09-18 DIAGNOSIS — I2510 Atherosclerotic heart disease of native coronary artery without angina pectoris: Secondary | ICD-10-CM | POA: Diagnosis not present

## 2022-09-18 DIAGNOSIS — I480 Paroxysmal atrial fibrillation: Secondary | ICD-10-CM | POA: Diagnosis not present

## 2022-09-18 DIAGNOSIS — G4733 Obstructive sleep apnea (adult) (pediatric): Secondary | ICD-10-CM | POA: Diagnosis not present

## 2022-09-18 NOTE — Progress Notes (Signed)
Subjective:  Patient ID: Kayla [name], female    DOB: 01/17/1941,  MRN: [id number]  Chief Complaint  Patient presents with   Wound Check    DOS: 09/04/2022 Procedure: Left partial First ray amputation  82 y.o. female returns for post-op check.  patient states that she is doing well.  Surgery was done by Dr. Ardelle Anton.  No fever chills vomiting.  Bandages clean dry and intact  Review of Systems: Negative except as noted in the HPI. Denies N/V/F/Ch.  Past Medical History:  Diagnosis Date   Allergy    Anxiety    Arthritis    CAD (coronary artery disease)    Mild per remote cath in 2006, /    Nuclear, January, 2013, no significant abnormality,   Carotid artery disease (HCC)    Doppler, January, 2013, 0 39% RIC A., 40-59% LICA, stable   Cataract    CHF (congestive heart failure) (HCC)    Chronic kidney disease    Complication of anesthesia    wakes up "mean"   COPD 02/16/2007   Intolerant of Spiriva, tudorza Intolerant of Trelegy    COPD (chronic obstructive pulmonary disease) (HCC)    Coronary atherosclerosis 11/24/2008   Catheterization 2006, nonobstructive coronary disease   //   nuclear 2013, low risk     Diabetes mellitus, type 2 (HCC)    Diverticula of colon    DIVERTICULOSIS OF COLON 03/23/2007   Qualifier: Diagnosis of  By: Craige Cotta MD, Vineet     Dizziness    occasional   Dizzy spells 05/04/2011   DM (diabetes mellitus) (HCC) 03/23/2007   Qualifier: Diagnosis of  By: Craige Cotta MD, Vineet     Dyspnea    On exertion   Ejection fraction    Essential hypertension 02/16/2007   Qualifier: Diagnosis of  By: Earlene Plater CMA, Tammy     G E R D 03/23/2007   Qualifier: Diagnosis of  By: Craige Cotta MD, Vineet     Gall bladder disease 04/28/2016   Gastritis and gastroduodenitis    GERD (gastroesophageal reflux disease)    Goiter    hx of   Headache(784.0)    migraine   Hyperlipidemia    Hypertension    Hypothyroidism    HYPOTHYROIDISM, POSTSURGICAL 06/04/2008   Qualifier: Diagnosis  of  By: Everardo All MD, Sean A    Left ventricular hypertrophy    Leg edema    occasional swelling   Leg swelling 12/09/2018   Myofascial pain syndrome, cervical 01/06/2018   Obesity    OBESITY 11/24/2008   Qualifier: Diagnosis of  By: Vikki Ports     OBSTRUCTIVE SLEEP APNEA 02/16/2007   Auto CPAP 09/03/13 to 10/02/13 >> used on 30 of 30 nights with average 6 hrs and 3 min.  Average AHI is 3.1 with median CPAP 5 cm H2O and 95 th percentile CPAP 6 cm H2O.     Occipital neuralgia of right side 01/06/2018   OSA (obstructive sleep apnea)    cpap setting of 12   Pain in joint of right hip 01/21/2018   Palpitations 01/21/2011   48 hour Holter, 2015, scattered PACs and PVCs.    Paroxysmal atrial fibrillation (HCC)    Pneumonia    PONV (postoperative nausea and vomiting)    severe after every surgery   Pulmonary hypertension, secondary    RHINITIS 07/21/2010        RUQ abdominal pain    S/P left TKA 07/27/2011   Sinus node dysfunction (HCC) 11/08/2015   Sleep  apnea    Spinal stenosis of cervical region 11/07/2014   Urinary frequency 12/09/2018   UTI (urinary tract infection) 08/15/2019   per pt report   VENTRICULAR HYPERTROPHY, LEFT 11/24/2008   Qualifier: Diagnosis of  By: Vikki Ports      Current Outpatient Medications:    acetaminophen (TYLENOL) 500 MG tablet, Take 1,000-1,500 mg by mouth every 8 (eight) hours as needed for mild pain, moderate pain or headache., Disp: , Rfl:    albuterol (PROVENTIL) (2.5 MG/3ML) 0.083% nebulizer solution, Take 3 mLs (2.5 mg total) by nebulization every 2 (two) hours as needed for wheezing or shortness of breath., Disp: 75 mL, Rfl: 5   albuterol (VENTOLIN HFA) 108 (90 Base) MCG/ACT inhaler, Inhale 2 puffs into the lungs every 6 (six) hours as needed for wheezing or shortness of breath., Disp: 3 each, Rfl: 3   Alcohol Swabs (B-D SINGLE USE SWABS REGULAR) PADS, , Disp: , Rfl:    allopurinol (ZYLOPRIM) 100 MG tablet, Take 100 mg by mouth 2 (two) times  daily., Disp: , Rfl:    amiodarone (PACERONE) 200 MG tablet, Take 1 tablet (200 mg total) by mouth daily., Disp: 90 tablet, Rfl: 3   amLODipine (NORVASC) 5 MG tablet, Take 2 tablets (10 mg total) by mouth daily., Disp: , Rfl:    ascorbic acid (VITAMIN C) 500 MG tablet, Take 500 mg by mouth daily with lunch., Disp: , Rfl:    atorvastatin (LIPITOR) 40 MG tablet, Take 1 tablet (40 mg total) by mouth daily., Disp: 90 tablet, Rfl: 3   Blood Glucose Calibration (TRUE METRIX LEVEL 1) Low SOLN, , Disp: , Rfl:    Budeson-Glycopyrrol-Formoterol (BREZTRI AEROSPHERE) 160-9-4.8 MCG/ACT AERO, Inhale 2 puffs into the lungs in the morning and at bedtime., Disp: 3 each, Rfl: 10   Carboxymethylcellulose Sodium (ARTIFICIAL TEARS OP), Place 1 drop into both eyes daily as needed (dry eyes)., Disp: , Rfl:    daptomycin (CUBICIN) IVPB, Inject 600 mg into the vein every other day. 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  First Dose: Yes Last Day of Therapy:  10/20/22 Labs - Once weekly:  CBC/D, BMP, and CPK Labs - Every other week:  ESR and CRP Method of administration: IV Push Method of administration may be changed at the discretion of home infusion pharmacist based upon assessment of the patient and/or caregiver's ability to self-administer the medication ordered., Disp: 20 Units, Rfl: 0   ELIQUIS 2.5 MG TABS tablet, TAKE 1 TABLET TWICE DAILY, Disp: 180 tablet, Rfl: 3   FARXIGA 10 MG TABS tablet, Take 10 mg by mouth daily., Disp: , Rfl:    ferrous sulfate 325 (65 FE) MG tablet, Take 1 tablet (325 mg total) by mouth daily with breakfast., Disp: 90 tablet, Rfl: 3   fluticasone (FLONASE) 50 MCG/ACT nasal spray, USE 2 SPRAYS NASALLY DAILY (Patient taking differently: Place 1 spray into both nostrils daily as needed for allergies. USE 2 SPRAYS NASALLY DAILY), Disp: 48 g, Rfl: 1   gabapentin (NEURONTIN) 100 MG capsule, Take 2 capsules (200 mg total) by mouth 3 (three) times daily., Disp: , Rfl:     HYDROcodone-acetaminophen (NORCO) 10-325 MG tablet, Take 1 tablet by mouth every 6 (six) hours as needed., Disp: 10 tablet, Rfl: 0   insulin aspart (NOVOLOG) 100 UNIT/ML FlexPen, Inject 10-17 Units into the skin See admin instructions. Inject 10-17 units into the skin three times a day with meals "as needed," per SLIDING SCALE, Disp: , Rfl:    insulin glargine (  LANTUS) 100 UNIT/ML injection, Inject 0.55 mLs (55 Units total) into the skin 2 (two) times daily. (Patient taking differently: Inject 25-50 Units into the skin See admin instructions. Inject 25 units subcutaneously daily in the morning. Inject 50 units subcutaneously daily at bedtime.), Disp: 10 mL, Rfl: 11   levothyroxine (SYNTHROID) 200 MCG tablet, Take 200 mcg by mouth daily before breakfast., Disp: , Rfl:    loratadine (CLARITIN) 10 MG tablet, Take 1 tablet (10 mg total) by mouth daily., Disp: , Rfl:    losartan (COZAAR) 25 MG tablet, Take 25 mg by mouth daily., Disp: , Rfl:    melatonin 5 MG TABS, Take 1 tablet (5 mg total) by mouth at bedtime as needed., Disp: , Rfl: 0   naloxone (NARCAN) nasal spray 4 mg/0.1 mL, Place 4 mg into the nose once as needed for opioid reversal., Disp: , Rfl:    nitroGLYCERIN (NITROSTAT) 0.4 MG SL tablet, Place 1 tablet (0.4 mg total) under the tongue every 5 (five) minutes as needed for chest pain., Disp: 25 tablet, Rfl: 4   polyethylene glycol (MIRALAX / GLYCOLAX) 17 g packet, Take 17 g by mouth daily as needed for mild constipation or moderate constipation., Disp: , Rfl: 0   PRESCRIPTION MEDICATION, CPAP- At bedtime and as needed during any time of rest, Disp: , Rfl:    torsemide (DEMADEX) 20 MG tablet, Take 2 tablets (40 mg total) by mouth daily., Disp: , Rfl:    TRUE METRIX BLOOD GLUCOSE TEST test strip, , Disp: , Rfl:    TRUEplus Lancets 28G MISC, , Disp: , Rfl:    vitamin B-12 (CYANOCOBALAMIN) 1000 MCG tablet, Take 1,000 mcg by mouth daily with lunch., Disp: , Rfl:    Vitamin D3 (VITAMIN D) 25 MCG  tablet, Take 1,000 Units by mouth daily with lunch., Disp: , Rfl:   Social History   Tobacco Use  Smoking Status Former   Packs/day: 2.00   Years: 33.00   Additional pack years: 0.00   Total pack years: 66.00   Types: Cigarettes   Start date: 1966   Quit date: 05/04/1997   Years since quitting: 25.4  Smokeless Tobacco Never    Allergies  Allergen Reactions   Dulaglutide Nausea Only, Other (See Comments) and Cough    TRULICITY made the patient sick to her stomach (only) several days after using it   Oxycodone Itching and Other (See Comments)    Pt still takes this med even thought it causes itching   Trelegy Ellipta [Fluticasone-Umeclidin-Vilant] Nausea And Vomiting, Other (See Comments) and Cough    Made the patient sick to her stomach (only) several days after using it    Objective:  There were no vitals filed for this visit. There is no height or weight on file to calculate BMI. Constitutional Well developed. Well nourished.  Vascular Foot warm and well perfused. Capillary refill normal to all digits.   Neurologic Normal speech. Oriented to person, place, and time. Epicritic sensation to light touch grossly present bilaterally.  Dermatologic Skin healing well without signs of infection. Skin edges well coapted without signs of infection.  Orthopedic: Tenderness to palpation noted about the surgical site.   Radiographs: None Assessment:  No diagnosis found. Plan:  Patient was evaluated and treated and all questions answered.  S/p foot surgery left -Progressing as expected post-operatively. -XR: See above -WB Status: Partial weightbearing to the heel in cam boot -Sutures: None.  -Medications: None -Foot redressed.  No follow-ups on file.

## 2022-09-23 ENCOUNTER — Telehealth: Payer: Self-pay

## 2022-09-23 ENCOUNTER — Ambulatory Visit: Payer: Medicare HMO | Attending: Family Medicine

## 2022-09-23 NOTE — Telephone Encounter (Signed)
Patient no showed for her wound check following device extraction. Will forward to scheduling team to see if we can get her re-scheduled within next few days.

## 2022-09-24 NOTE — Telephone Encounter (Signed)
Patient missed 10 day wound check appt s/p device extraction. She lives in nursing facility and it is very hard for her to get to appts.   She sees Otilio Saber, [place] on 10/16/22.  Nurse at facility sent current picture of device/wound site for evaluation. If okay, can patient wait to be seen at June appointment?

## 2022-09-25 NOTE — Telephone Encounter (Signed)
Cassie EP scheduler to call and schedule patient for 5/30

## 2022-09-25 NOTE — Telephone Encounter (Signed)
Ms. Tresa Endo Nurse Manager consulted on urgency of follow up appointment / assessment of Pt wound healing per picture received from Pt.  Ms. Tresa Endo contacted Mr. Tillery PA-C to advise on when follow up should be?   Message / orders received form Mr. Natasha Bence, Pt needs to be seen sooner than appointment with him on 10/16/22;  He wants a Device clinic appointment scheduled next Thursday when Dr. Ladona Ridgel is in clinic, So Dr. Ladona Ridgel can assess this and advise next steps in plan of care.    I will forward a message to Device Team, to schedule an appointment on Thursday, 10/01/2022 when Dr. Ladona Ridgel is in clinic;  I will also advise Dr. Ladona Ridgel of this order from Mr. Tillery PA-C.  Follow up call from Device Team to Pt is required.

## 2022-09-30 ENCOUNTER — Encounter (HOSPITAL_COMMUNITY): Payer: Self-pay | Admitting: *Deleted

## 2022-09-30 ENCOUNTER — Other Ambulatory Visit: Payer: Self-pay

## 2022-09-30 ENCOUNTER — Observation Stay (HOSPITAL_COMMUNITY)
Admission: EM | Admit: 2022-09-30 | Discharge: 2022-10-01 | Disposition: A | Discharge status: Home Health | Payer: Medicare HMO | Attending: Emergency Medicine | Admitting: Emergency Medicine

## 2022-09-30 ENCOUNTER — Telehealth: Payer: Self-pay | Admitting: Pharmacist

## 2022-09-30 DIAGNOSIS — R7881 Bacteremia: Secondary | ICD-10-CM

## 2022-09-30 DIAGNOSIS — E1140 Type 2 diabetes mellitus with diabetic neuropathy, unspecified: Secondary | ICD-10-CM | POA: Insufficient documentation

## 2022-09-30 DIAGNOSIS — E1169 Type 2 diabetes mellitus with other specified complication: Secondary | ICD-10-CM | POA: Diagnosis not present

## 2022-09-30 DIAGNOSIS — Z89412 Acquired absence of left great toe: Secondary | ICD-10-CM | POA: Insufficient documentation

## 2022-09-30 DIAGNOSIS — I5032 Chronic diastolic (congestive) heart failure: Secondary | ICD-10-CM | POA: Diagnosis present

## 2022-09-30 DIAGNOSIS — E1122 Type 2 diabetes mellitus with diabetic chronic kidney disease: Secondary | ICD-10-CM | POA: Insufficient documentation

## 2022-09-30 DIAGNOSIS — E890 Postprocedural hypothyroidism: Secondary | ICD-10-CM | POA: Diagnosis present

## 2022-09-30 DIAGNOSIS — N189 Chronic kidney disease, unspecified: Secondary | ICD-10-CM | POA: Diagnosis present

## 2022-09-30 DIAGNOSIS — T82594A Other mechanical complication of infusion catheter, initial encounter: Secondary | ICD-10-CM | POA: Diagnosis present

## 2022-09-30 DIAGNOSIS — N179 Acute kidney failure, unspecified: Secondary | ICD-10-CM | POA: Diagnosis present

## 2022-09-30 DIAGNOSIS — D75839 Thrombocytosis, unspecified: Secondary | ICD-10-CM | POA: Insufficient documentation

## 2022-09-30 DIAGNOSIS — J449 Chronic obstructive pulmonary disease, unspecified: Secondary | ICD-10-CM | POA: Diagnosis not present

## 2022-09-30 DIAGNOSIS — Z7984 Long term (current) use of oral hypoglycemic drugs: Secondary | ICD-10-CM | POA: Diagnosis not present

## 2022-09-30 DIAGNOSIS — I480 Paroxysmal atrial fibrillation: Secondary | ICD-10-CM | POA: Diagnosis not present

## 2022-09-30 DIAGNOSIS — M869 Osteomyelitis, unspecified: Secondary | ICD-10-CM | POA: Diagnosis present

## 2022-09-30 DIAGNOSIS — J9611 Chronic respiratory failure with hypoxia: Secondary | ICD-10-CM | POA: Insufficient documentation

## 2022-09-30 DIAGNOSIS — R001 Bradycardia, unspecified: Secondary | ICD-10-CM | POA: Diagnosis not present

## 2022-09-30 DIAGNOSIS — Z87891 Personal history of nicotine dependence: Secondary | ICD-10-CM | POA: Insufficient documentation

## 2022-09-30 DIAGNOSIS — Z794 Long term (current) use of insulin: Secondary | ICD-10-CM | POA: Insufficient documentation

## 2022-09-30 DIAGNOSIS — E119 Type 2 diabetes mellitus without complications: Secondary | ICD-10-CM

## 2022-09-30 DIAGNOSIS — Z89422 Acquired absence of other left toe(s): Secondary | ICD-10-CM | POA: Diagnosis not present

## 2022-09-30 DIAGNOSIS — I5033 Acute on chronic diastolic (congestive) heart failure: Secondary | ICD-10-CM | POA: Diagnosis present

## 2022-09-30 DIAGNOSIS — E669 Obesity, unspecified: Secondary | ICD-10-CM | POA: Diagnosis present

## 2022-09-30 DIAGNOSIS — E785 Hyperlipidemia, unspecified: Secondary | ICD-10-CM | POA: Insufficient documentation

## 2022-09-30 DIAGNOSIS — N184 Chronic kidney disease, stage 4 (severe): Secondary | ICD-10-CM | POA: Insufficient documentation

## 2022-09-30 DIAGNOSIS — T829XXA Unspecified complication of cardiac and vascular prosthetic device, implant and graft, initial encounter: Secondary | ICD-10-CM | POA: Diagnosis present

## 2022-09-30 DIAGNOSIS — Z7901 Long term (current) use of anticoagulants: Secondary | ICD-10-CM | POA: Diagnosis not present

## 2022-09-30 DIAGNOSIS — Z950 Presence of cardiac pacemaker: Secondary | ICD-10-CM | POA: Diagnosis not present

## 2022-09-30 DIAGNOSIS — D72829 Elevated white blood cell count, unspecified: Secondary | ICD-10-CM | POA: Diagnosis not present

## 2022-09-30 DIAGNOSIS — E039 Hypothyroidism, unspecified: Secondary | ICD-10-CM | POA: Diagnosis not present

## 2022-09-30 DIAGNOSIS — I130 Hypertensive heart and chronic kidney disease with heart failure and stage 1 through stage 4 chronic kidney disease, or unspecified chronic kidney disease: Secondary | ICD-10-CM | POA: Insufficient documentation

## 2022-09-30 DIAGNOSIS — B9562 Methicillin resistant Staphylococcus aureus infection as the cause of diseases classified elsewhere: Secondary | ICD-10-CM | POA: Diagnosis not present

## 2022-09-30 DIAGNOSIS — Z96652 Presence of left artificial knee joint: Secondary | ICD-10-CM | POA: Diagnosis not present

## 2022-09-30 DIAGNOSIS — I2510 Atherosclerotic heart disease of native coronary artery without angina pectoris: Secondary | ICD-10-CM | POA: Diagnosis not present

## 2022-09-30 DIAGNOSIS — G4733 Obstructive sleep apnea (adult) (pediatric): Secondary | ICD-10-CM | POA: Diagnosis present

## 2022-09-30 DIAGNOSIS — M86172 Other acute osteomyelitis, left ankle and foot: Secondary | ICD-10-CM | POA: Insufficient documentation

## 2022-09-30 MED ORDER — SODIUM CHLORIDE 0.9% FLUSH: 10.0000 mL | INTRAVENOUS | Status: DC | PRN

## 2022-09-30 MED ORDER — CHLORHEXIDINE GLUCONATE CLOTH 2 % EX PADS
6.0000 | MEDICATED_PAD | Freq: Every day | CUTANEOUS | Status: DC
  Administered 2022-10-01: 6 via TOPICAL

## 2022-09-30 MED ORDER — SODIUM CHLORIDE 0.9 % IV SOLN
600.0000 mg | INTRAVENOUS | Status: AC
  Administered 2022-10-01: 600 mg via INTRAVENOUS
  Filled 2022-09-30: qty 12

## 2022-09-30 NOTE — ED Notes (Addendum)
IV team able to troubleshot PICC line. Was able to get patency back.

## 2022-09-30 NOTE — ED Triage Notes (Signed)
The pts picc line has been stopped up since yesterday.  The picc line is in her rt upper arm  she just left [facility] nursing [facility] yesterday.   The attempts have been made to open it  back up unsuccessful

## 2022-09-30 NOTE — Telephone Encounter (Signed)
Nurse called from [facility] for this patient stating her PICC line clotted and that she had no infusion company set up to call and discuss issues with. She called [place] to as a heads up about this issue and to let us know the patient was informed to go to the ED for [facility]. Patient arrived in ED ~4:30pm today.   Margarite Gouge, PharmD, CPP, BCIDP, AAHIVP Clinical Pharmacist Practitioner Infectious Diseases Clinical Pharmacist [facility]

## 2022-09-30 NOTE — ED Triage Notes (Signed)
The pt  missed her antibiotic yesterday and she needs that antibiotic asap  she is being treated for mersa  with iv meds

## 2022-10-01 ENCOUNTER — Ambulatory Visit: Payer: Medicare HMO

## 2022-10-01 DIAGNOSIS — I480 Paroxysmal atrial fibrillation: Secondary | ICD-10-CM

## 2022-10-01 DIAGNOSIS — G4733 Obstructive sleep apnea (adult) (pediatric): Secondary | ICD-10-CM

## 2022-10-01 DIAGNOSIS — J449 Chronic obstructive pulmonary disease, unspecified: Secondary | ICD-10-CM

## 2022-10-01 DIAGNOSIS — N189 Chronic kidney disease, unspecified: Secondary | ICD-10-CM

## 2022-10-01 DIAGNOSIS — I5032 Chronic diastolic (congestive) heart failure: Secondary | ICD-10-CM | POA: Diagnosis not present

## 2022-10-01 DIAGNOSIS — E1169 Type 2 diabetes mellitus with other specified complication: Secondary | ICD-10-CM

## 2022-10-01 DIAGNOSIS — B9562 Methicillin resistant Staphylococcus aureus infection as the cause of diseases classified elsewhere: Secondary | ICD-10-CM

## 2022-10-01 DIAGNOSIS — N179 Acute kidney failure, unspecified: Secondary | ICD-10-CM | POA: Diagnosis not present

## 2022-10-01 DIAGNOSIS — M869 Osteomyelitis, unspecified: Secondary | ICD-10-CM | POA: Diagnosis not present

## 2022-10-01 DIAGNOSIS — E785 Hyperlipidemia, unspecified: Secondary | ICD-10-CM

## 2022-10-01 DIAGNOSIS — T829XXA Unspecified complication of cardiac and vascular prosthetic device, implant and graft, initial encounter: Secondary | ICD-10-CM

## 2022-10-01 DIAGNOSIS — R001 Bradycardia, unspecified: Secondary | ICD-10-CM | POA: Diagnosis not present

## 2022-10-01 DIAGNOSIS — Z794 Long term (current) use of insulin: Secondary | ICD-10-CM

## 2022-10-01 DIAGNOSIS — R7881 Bacteremia: Secondary | ICD-10-CM

## 2022-10-01 DIAGNOSIS — Z6838 Body mass index (BMI) 38.0-38.9, adult: Secondary | ICD-10-CM

## 2022-10-01 DIAGNOSIS — E890 Postprocedural hypothyroidism: Secondary | ICD-10-CM

## 2022-10-01 DIAGNOSIS — D72829 Elevated white blood cell count, unspecified: Secondary | ICD-10-CM

## 2022-10-01 DIAGNOSIS — E119 Type 2 diabetes mellitus without complications: Secondary | ICD-10-CM

## 2022-10-01 DIAGNOSIS — Z950 Presence of cardiac pacemaker: Secondary | ICD-10-CM

## 2022-10-01 LAB — GLUCOSE, CAPILLARY
Glucose-Capillary: 294 mg/dL — ABNORMAL HIGH (ref 70–99)
Glucose-Capillary: 267 mg/dL — ABNORMAL HIGH (ref 70–99)

## 2022-10-01 LAB — CBC WITH DIFFERENTIAL/PLATELET
Abs Immature Granulocytes: 0.08 10*3/uL — ABNORMAL HIGH (ref 0.00–0.07)
Abs Immature Granulocytes: 0.09 10*3/uL — ABNORMAL HIGH (ref 0.00–0.07)
Basophils Absolute: 0.1 10*3/uL (ref 0.0–0.1)
Basophils Absolute: 0.1 10*3/uL (ref 0.0–0.1)
Basophils Relative: 1 %
Basophils Relative: 1 %
Eosinophils Absolute: 0.3 10*3/uL (ref 0.0–0.5)
Eosinophils Absolute: 0.3 10*3/uL (ref 0.0–0.5)
Eosinophils Relative: 2 %
Eosinophils Relative: 3 %
HCT: 27.7 % — ABNORMAL LOW (ref 36.0–46.0)
HCT: 28.6 % — ABNORMAL LOW (ref 36.0–46.0)
Hemoglobin: 8.5 g/dL — ABNORMAL LOW (ref 12.0–15.0)
Hemoglobin: 8.6 g/dL — ABNORMAL LOW (ref 12.0–15.0)
Immature Granulocytes: 1 %
Immature Granulocytes: 1 %
Lymphocytes Relative: 16 %
Lymphocytes Relative: 19 %
Lymphs Abs: 2 10*3/uL (ref 0.7–4.0)
Lymphs Abs: 2.4 10*3/uL (ref 0.7–4.0)
MCH: 28.6 pg (ref 26.0–34.0)
MCH: 29.6 pg (ref 26.0–34.0)
MCHC: 30.1 g/dL (ref 30.0–36.0)
MCHC: 30.7 g/dL (ref 30.0–36.0)
MCV: 95 fL (ref 80.0–100.0)
MCV: 96.5 fL (ref 80.0–100.0)
Monocytes Absolute: 0.8 10*3/uL (ref 0.1–1.0)
Monocytes Absolute: 1 10*3/uL (ref 0.1–1.0)
Monocytes Relative: 7 %
Monocytes Relative: 8 %
Neutro Abs: 8.8 10*3/uL — ABNORMAL HIGH (ref 1.7–7.7)
Neutro Abs: 8.9 10*3/uL — ABNORMAL HIGH (ref 1.7–7.7)
Neutrophils Relative %: 70 %
Neutrophils Relative %: 71 %
Platelets: 498 10*3/uL — ABNORMAL HIGH (ref 150–400)
Platelets: 511 10*3/uL — ABNORMAL HIGH (ref 150–400)
RBC: 2.87 MIL/uL — ABNORMAL LOW (ref 3.87–5.11)
RBC: 3.01 MIL/uL — ABNORMAL LOW (ref 3.87–5.11)
RDW: 15.2 % (ref 11.5–15.5)
RDW: 15.3 % (ref 11.5–15.5)
WBC: 12.4 10*3/uL — ABNORMAL HIGH (ref 4.0–10.5)
WBC: 12.5 10*3/uL — ABNORMAL HIGH (ref 4.0–10.5)
nRBC: 0 % (ref 0.0–0.2)
nRBC: 0 % (ref 0.0–0.2)

## 2022-10-01 LAB — COMPREHENSIVE METABOLIC PANEL
ALT: 15 U/L (ref 0–44)
AST: 19 U/L (ref 15–41)
Albumin: 2 g/dL — ABNORMAL LOW (ref 3.5–5.0)
Alkaline Phosphatase: 61 U/L (ref 38–126)
Anion gap: 13 (ref 5–15)
BUN: 48 mg/dL — ABNORMAL HIGH (ref 8–23)
CO2: 27 mmol/L (ref 22–32)
Calcium: 8.6 mg/dL — ABNORMAL LOW (ref 8.9–10.3)
Chloride: 101 mmol/L (ref 98–111)
Creatinine, Ser: 2.36 mg/dL — ABNORMAL HIGH (ref 0.44–1.00)
GFR, Estimated: 20 mL/min — ABNORMAL LOW (ref >60)
Glucose, Bld: 280 mg/dL — ABNORMAL HIGH (ref 70–99)
Potassium: 4 mmol/L (ref 3.5–5.1)
Sodium: 141 mmol/L (ref 135–145)
Total Bilirubin: 0.4 mg/dL (ref 0.3–1.2)
Total Protein: 6.1 g/dL — ABNORMAL LOW (ref 6.5–8.1)

## 2022-10-01 LAB — BASIC METABOLIC PANEL
Anion gap: 10 (ref 5–15)
BUN: 45 mg/dL — ABNORMAL HIGH (ref 8–23)
CO2: 27 mmol/L (ref 22–32)
Calcium: 8.6 mg/dL — ABNORMAL LOW (ref 8.9–10.3)
Chloride: 103 mmol/L (ref 98–111)
Creatinine, Ser: 2.28 mg/dL — ABNORMAL HIGH (ref 0.44–1.00)
GFR, Estimated: 21 mL/min — ABNORMAL LOW (ref >60)
Glucose, Bld: 167 mg/dL — ABNORMAL HIGH (ref 70–99)
Potassium: 4.3 mmol/L (ref 3.5–5.1)
Sodium: 140 mmol/L (ref 135–145)

## 2022-10-01 LAB — MAGNESIUM
Magnesium: 1.6 mg/dL — ABNORMAL LOW (ref 1.7–2.4)
Magnesium: 1.8 mg/dL (ref 1.7–2.4)

## 2022-10-01 MED ORDER — INSULIN GLARGINE-YFGN 100 UNIT/ML ~~LOC~~ SOLN
25.0000 [IU] | Freq: Every day | SUBCUTANEOUS | Status: DC
  Administered 2022-10-01: 25 [IU] via SUBCUTANEOUS
  Filled 2022-10-01: qty 0.25

## 2022-10-01 MED ORDER — ALLOPURINOL 100 MG PO TABS
100.0000 mg | ORAL_TABLET | Freq: Two times a day (BID) | ORAL | Status: DC
  Administered 2022-10-01: 100 mg via ORAL
  Filled 2022-10-01: qty 1

## 2022-10-01 MED ORDER — MOMETASONE FURO-FORMOTEROL FUM 200-5 MCG/ACT IN AERO
2.0000 | INHALATION_SPRAY | Freq: Two times a day (BID) | RESPIRATORY_TRACT | Status: DC
  Filled 2022-10-01: qty 8.8

## 2022-10-01 MED ORDER — INSULIN ASPART 100 UNIT/ML IJ SOLN
0.0000 [IU] | Freq: Three times a day (TID) | INTRAMUSCULAR | Status: DC
  Administered 2022-10-01: 11 [IU] via SUBCUTANEOUS

## 2022-10-01 MED ORDER — POLYVINYL ALCOHOL 1.4 % OP SOLN: 1.0000 [drp] | OPHTHALMIC | Status: DC | PRN

## 2022-10-01 MED ORDER — INSULIN GLARGINE-YFGN 100 UNIT/ML ~~LOC~~ SOLN
50.0000 [IU] | Freq: Every day | SUBCUTANEOUS | Status: DC
  Filled 2022-10-01: qty 0.5

## 2022-10-01 MED ORDER — DAPTOMYCIN IV (FOR PTA / DISCHARGE USE ONLY): 600.0000 mg | INTRAVENOUS | Status: DC

## 2022-10-01 MED ORDER — ALBUTEROL SULFATE (2.5 MG/3ML) 0.083% IN NEBU
INHALATION_SOLUTION | RESPIRATORY_TRACT | Status: AC
  Administered 2022-10-01: 2.5 mg
  Filled 2022-10-01: qty 3

## 2022-10-01 MED ORDER — LOSARTAN POTASSIUM 25 MG PO TABS
25.0000 mg | ORAL_TABLET | Freq: Every day | ORAL | Status: DC
  Filled 2022-10-01: qty 1

## 2022-10-01 MED ORDER — BUDESON-GLYCOPYRROL-FORMOTEROL 160-9-4.8 MCG/ACT IN AERO: 2.0000 | INHALATION_SPRAY | Freq: Two times a day (BID) | RESPIRATORY_TRACT | Status: DC

## 2022-10-01 MED ORDER — LEVOTHYROXINE SODIUM 100 MCG PO TABS: 200.0000 ug | ORAL_TABLET | Freq: Every day | ORAL | Status: DC

## 2022-10-01 MED ORDER — DAPTOMYCIN IV (FOR PTA / DISCHARGE USE ONLY)
600.0000 mg | INTRAVENOUS | 0 refills | Status: DC
Start: 2022-10-02 — End: 2022-10-01

## 2022-10-01 MED ORDER — HYDROXYZINE HCL 25 MG PO TABS: 25.0000 mg | ORAL_TABLET | Freq: Every evening | ORAL | Status: DC | PRN

## 2022-10-01 MED ORDER — GLYCERIN-HYPROMELLOSE-PEG 400 0.2-0.2-1 % OP SOLN: 1.0000 [drp] | Freq: Every day | OPHTHALMIC | Status: DC | PRN

## 2022-10-01 MED ORDER — HEPARIN SOD (PORK) LOCK FLUSH 100 UNIT/ML IV SOLN
500.0000 [IU] | Freq: Once | INTRAVENOUS | Status: AC
  Administered 2022-10-01: 500 [IU]
  Filled 2022-10-01: qty 5

## 2022-10-01 MED ORDER — ATORVASTATIN CALCIUM 40 MG PO TABS: 40.0000 mg | ORAL_TABLET | Freq: Every day | ORAL | Status: DC

## 2022-10-01 MED ORDER — ONDANSETRON HCL 4 MG/2ML IJ SOLN: 4.0000 mg | Freq: Four times a day (QID) | INTRAMUSCULAR | Status: DC | PRN

## 2022-10-01 MED ORDER — POLYETHYLENE GLYCOL 3350 17 G PO PACK: 17.0000 g | PACK | Freq: Every day | ORAL | Status: DC | PRN

## 2022-10-01 MED ORDER — HEPARIN SOD (PORK) LOCK FLUSH 100 UNIT/ML IV SOLN
250.0000 [IU] | INTRAVENOUS | Status: AC | PRN
  Administered 2022-10-01: 250 [IU]

## 2022-10-01 MED ORDER — FLUTICASONE PROPIONATE 50 MCG/ACT NA SUSP
1.0000 | Freq: Every day | NASAL | Status: DC
  Administered 2022-10-01: 1 via NASAL
  Filled 2022-10-01: qty 16

## 2022-10-01 MED ORDER — UMECLIDINIUM BROMIDE 62.5 MCG/ACT IN AEPB
1.0000 | INHALATION_SPRAY | Freq: Every day | RESPIRATORY_TRACT | Status: DC
  Administered 2022-10-01: 1 via RESPIRATORY_TRACT
  Filled 2022-10-01: qty 7

## 2022-10-01 MED ORDER — GUAIFENESIN 100 MG/5ML PO LIQD
5.0000 mL | ORAL | Status: DC | PRN
  Administered 2022-10-01: 5 mL via ORAL
  Filled 2022-10-01: qty 10

## 2022-10-01 MED ORDER — INSULIN GLARGINE-YFGN 100 UNIT/ML ~~LOC~~ SOLN
25.0000 [IU] | Freq: Every day | SUBCUTANEOUS | Status: DC
  Filled 2022-10-01: qty 0.25

## 2022-10-01 MED ORDER — APIXABAN 2.5 MG PO TABS
2.5000 mg | ORAL_TABLET | Freq: Two times a day (BID) | ORAL | Status: DC
  Administered 2022-10-01: 2.5 mg via ORAL
  Filled 2022-10-01: qty 1

## 2022-10-01 MED ORDER — ACETAMINOPHEN 650 MG RE SUPP: 650.0000 mg | Freq: Four times a day (QID) | RECTAL | Status: DC | PRN

## 2022-10-01 MED ORDER — MELATONIN 3 MG PO TABS: 3.0000 mg | ORAL_TABLET | Freq: Every evening | ORAL | Status: DC | PRN

## 2022-10-01 MED ORDER — MAGNESIUM SULFATE 2 GM/50ML IV SOLN
2.0000 g | Freq: Once | INTRAVENOUS | Status: AC
  Administered 2022-10-01: 2 g via INTRAVENOUS
  Filled 2022-10-01: qty 50

## 2022-10-01 MED ORDER — TORSEMIDE 20 MG PO TABS
40.0000 mg | ORAL_TABLET | Freq: Every day | ORAL | Status: DC
  Administered 2022-10-01: 40 mg via ORAL
  Filled 2022-10-01: qty 2

## 2022-10-01 MED ORDER — AMIODARONE HCL 200 MG PO TABS
200.0000 mg | ORAL_TABLET | Freq: Every day | ORAL | Status: DC
  Administered 2022-10-01: 200 mg via ORAL
  Filled 2022-10-01: qty 1

## 2022-10-01 MED ORDER — SENNOSIDES-DOCUSATE SODIUM 8.6-50 MG PO TABS: 1.0000 | ORAL_TABLET | Freq: Every day | ORAL | Status: DC

## 2022-10-01 MED ORDER — LORATADINE 10 MG PO TABS
10.0000 mg | ORAL_TABLET | Freq: Every day | ORAL | Status: DC
  Administered 2022-10-01: 10 mg via ORAL
  Filled 2022-10-01: qty 1

## 2022-10-01 MED ORDER — DAPTOMYCIN IV (FOR PTA / DISCHARGE USE ONLY): 600.0000 mg | INTRAVENOUS | 0 refills | Status: AC

## 2022-10-01 MED ORDER — ALBUTEROL SULFATE (2.5 MG/3ML) 0.083% IN NEBU
2.5000 mg | INHALATION_SOLUTION | RESPIRATORY_TRACT | Status: DC | PRN
  Administered 2022-10-01 (×2): 2.5 mg via RESPIRATORY_TRACT
  Filled 2022-10-01: qty 3

## 2022-10-01 MED ORDER — GABAPENTIN 100 MG PO CAPS
200.0000 mg | ORAL_CAPSULE | Freq: Three times a day (TID) | ORAL | Status: DC
  Administered 2022-10-01: 200 mg via ORAL
  Filled 2022-10-01: qty 2

## 2022-10-01 MED ORDER — HYDROCODONE-ACETAMINOPHEN 10-325 MG PO TABS: 1.0000 | ORAL_TABLET | Freq: Four times a day (QID) | ORAL | Status: DC | PRN

## 2022-10-01 MED ORDER — ACETAMINOPHEN 325 MG PO TABS: 650.0000 mg | ORAL_TABLET | Freq: Four times a day (QID) | ORAL | Status: DC | PRN

## 2022-10-01 MED ORDER — SODIUM CHLORIDE 0.9 % IV SOLN: 600.0000 mg | INTRAVENOUS | Status: DC

## 2022-10-01 NOTE — Progress Notes (Signed)
  Carryover admission to the Day Admitter.  I discussed this case with the EDP, Roxy Horseman, PA.  Per these discussions:   This is a 82 year old female who is being admitted with concern for potential symptomatic bradycardia.  She was hospitalized at [facility] during the first half of May 2024 with MRSA bacteremia, with vegetations found on her pacemaker at that time.  She also underwent amputation of multiple toes at that time.  During that hospitalization, she underwent removal of her pacemaker and underwent PICC placement and was subsequently discharged to SNF on daptomycin for the MRSA bacteremia.  She was discharged from SNF to home yesterday, before presenting to the emergency department this evening complaining of a clogged PICC line.  This was cleared in the ED, with functionality of PICC line confirmed.  However, while in the ED, was noted to be bradycardic, with heart rates in the 30s, with the patient reporting that she has recently been dizzy.  Blood pressure normotensive.  EDP subsequently discussed patient's case with the on-call cardiology fellow, Dr. Hetty Ely, who recommended admission to [facility] for further evaluation of potential symptomatic bradycardia.  Cardiology to formally consult and see the patient in the morning, with current plan that includes recommendation for physical therapy while on cardiac monitor to evaluate heart rate response to exertion as assessment for ability to compensate.  This will aid in assessment of potential need for temporary pacemaker  I have placed some additional preliminary admit orders via the adult multi-morbid admission order set, including ordering DNR per CODE STATUS documentation from his recent prior hospitalization. I have also ordered morning labs in the form of CMP, CBC, and serum magnesium level. Will need order for resumption of outpatient daptomycin.    Newton Pigg, DO Hospitalist

## 2022-10-01 NOTE — Progress Notes (Signed)
Discharge teaching complete. Meds, diet, activity, follow up appointments reviewed and all questions answered. Copy of instructions given to patient. Patient discharged home via wheelchair with husband and home O2 tank

## 2022-10-01 NOTE — Evaluation (Signed)
Physical Therapy Evaluation Patient Details Name: Kayla [name] MRN: [id number] DOB: [birth year] Today's Date: 10/01/2022  History of Present Illness  Pt is 82 year old female who presented 09/30/22  with occluded PICC line. Recently hospitalized from 5/2-5/14 for left amputation site infection. S/p lt great toe amputation on 09/04/22, pacer extraction on 09/08/22. PMH - copd on 3L O2, OSA, pacer, afib, DM, lymphedema, TKA, lt 4th toe amputation   Clinical Impression  Pt presents with condition above and deficits mentioned below, see PT Problem List. Pt just recently discharged home from a SNF after rehab, in which she was mod I using a RW afterwards. Pt lives with her husband in a 1-level house with a ramped entrance. At this time, she is capable of performing all functional mobility with RW support without LOB or physical assistance, displaying deficits in strength, endurance, and balance. Recommending follow-up with HHPT. Will continue to follow acutely.       Recommendations for follow up therapy are one component of a multi-disciplinary discharge planning process, led by the attending physician.  Recommendations may be updated based on patient status, additional functional criteria and insurance authorization.  Follow Up Recommendations Can patient physically be transported by private vehicle: Yes     Assistance Recommended at Discharge Intermittent Supervision/Assistance  Patient can return home with the following  A little help with walking and/or transfers;A little help with bathing/dressing/bathroom;Assistance with cooking/housework;Assist for transportation;Help with stairs or ramp for entrance    Equipment Recommendations None recommended by PT  Recommendations for Other Services       Functional Status Assessment Patient has had a recent decline in their functional status and demonstrates the ability to make significant improvements in function in a reasonable and predictable  amount of time.     Precautions / Restrictions Precautions Precautions: Fall Precaution Comments: Contact Required Braces or Orthoses: Other Brace Other Brace: Bilateral post op shoes Restrictions Weight Bearing Restrictions: No LUE Weight Bearing: Non weight bearing RLE Weight Bearing: Weight bearing as tolerated LLE Weight Bearing: Weight bearing as tolerated Other Position/Activity Restrictions: in post op shoes, L heel WB per spouse      Mobility  Bed Mobility               General bed mobility comments: Pt sitting EOB upon arrival    Transfers Overall transfer level: Needs assistance Equipment used: Rolling walker (2 wheels) Transfers: Sit to/from Stand Sit to Stand: Min guard           General transfer comment: Min guard for safety, needs cues to reach back to control descent to sit    Ambulation/Gait Ambulation/Gait assistance: Min guard Gait Distance (Feet): 60 Feet Assistive device: Rolling walker (2 wheels) Gait Pattern/deviations: Step-through pattern, Decreased stride length Gait velocity: reduced Gait velocity interpretation: 1.31 - 2.62 ft/sec, indicative of limited community ambulator   General Gait Details: Pt ambulates with step-through gait pattern and no LOB. Pt has tendency to pick up RW when turning. Min guard for Wellsite geologist    Modified Rankin (Stroke Patients Only)       Balance Overall balance assessment: Needs assistance Sitting-balance support: No upper extremity supported, Feet supported Sitting balance-Leahy Scale: Good     Standing balance support: Bilateral upper extremity supported, During functional activity, Reliant on assistive device for balance Standing balance-Leahy Scale: Poor Standing balance comment: Reliant on RW  Pertinent Vitals/Pain Pain Assessment Pain Assessment: Faces Faces Pain Scale: No hurt Pain Intervention(s):  Monitored during session    Home Living Family/patient expects to be discharged to:: Private residence Living Arrangements: Spouse/significant other Available Help at Discharge: Family;Available 24 hours/day Type of Home: House Home Access: Ramped entrance       Home Layout: One level Home Equipment: Rollator (4 wheels);Rolling Walker (2 wheels);Cane - single point;Shower seat;Tub bench (home O2 3L)      Prior Function Prior Level of Function : Needs assist       Physical Assist : ADLs (physical)   ADLs (physical): IADLs;Dressing;Bathing Mobility Comments: amb modified independent with walker, even after recent SNF stay ADLs Comments: Husband assists with donning socks and shoes as well as tub transfers. husband performs majority of IADLs     Hand Dominance   Dominant Hand: Right    Extremity/Trunk Assessment   Upper Extremity Assessment Upper Extremity Assessment: Overall WFL for tasks assessed    Lower Extremity Assessment Lower Extremity Assessment: Generalized weakness (recent L great toe amputation)       Communication   Communication: No difficulties  Cognition Arousal/Alertness: Awake/alert Behavior During Therapy: WFL for tasks assessed/performed, Anxious Overall Cognitive Status: Within Functional Limits for tasks assessed                                          General Comments General comments (skin integrity, edema, etc.): VSS on 3L; discussed increasing pulmonary function with breathing techniques and increasing endurance with frequent mobility    Exercises     Assessment/Plan    PT Assessment Patient needs continued PT services  PT Problem List Decreased strength;Decreased balance;Decreased mobility;Cardiopulmonary status limiting activity;Obesity;Decreased activity tolerance       PT Treatment Interventions DME instruction;Gait training;Functional mobility training;Therapeutic activities;Therapeutic exercise;Balance  training;Patient/family education;Neuromuscular re-education    PT Goals (Current goals can be found in the Care Plan section)  Acute Rehab PT Goals Patient Stated Goal: to go home PT Goal Formulation: With patient/family Time For Goal Achievement: 10/15/22 Potential to Achieve Goals: Good    Frequency Min 1X/week     Co-evaluation               AM-PAC PT "6 Clicks" Mobility  Outcome Measure Help needed turning from your back to your side while in a flat bed without using bedrails?: None Help needed moving from lying on your back to sitting on the side of a flat bed without using bedrails?: A Little Help needed moving to and from a bed to a chair (including a wheelchair)?: A Little Help needed standing up from a chair using your arms (e.g., wheelchair or bedside chair)?: A Little Help needed to walk in hospital room?: A Little Help needed climbing 3-5 steps with a railing? : A Lot 6 Click Score: 18    End of Session Equipment Utilized During Treatment: Oxygen Activity Tolerance: Patient tolerated treatment well Patient left: in bed;with call bell/phone within reach;with family/visitor present Nurse Communication: Mobility status;Other (comment) (sitting EOB without alarm on per pt request - RN ok with this) PT Visit Diagnosis: Unsteadiness on feet (R26.81);Other abnormalities of gait and mobility (R26.89);Difficulty in walking, not elsewhere classified (R26.2);Muscle weakness (generalized) (M62.81)    Time: 1478-2956 PT Time Calculation (min) (ACUTE ONLY): 15 min   Charges:   PT Evaluation $PT Eval Moderate Complexity: 1 Mod  Raymond Gurney, PT, DPT Acute Rehabilitation Services  Office: [phone]   Jewel Baize 10/01/2022, 5:18 PM

## 2022-10-01 NOTE — Consult Note (Signed)
WOC Nurse Consult Note: Reason for Consult:Left foot incision line. Patient is s/p amputation on 09/04/22 with podiatric medicine (Dr. Ardelle Anton) and first follow up with Dr., Concha Se on 09/18/22. She was to see Dr. Allena Katz in the office tomorrow 10/02/22 for assessment and suture removal.  Wound type: Surgical Pressure Injury POA: N/A Dressing procedure/placement/frequency: I have communicated with Dr. Allena Katz this morning via Secure Chat and have included the patient's Bedside RN. Alford Highland. Dr. Allena Katz has given instructions for dressing removal today and daily care using a betadine dressings and I have entered those as guidance for Nursing under Orders.  When the Hospitalist is identified, and if patient is to remain in house until tomorrow, a consultation request for Podiatric Medicine is needed so that Dr. Allena Katz or one of his associates can see the patient for suture removal following assessment of the surgical incision line.  WOC nursing team will not follow, but will remain available to this patient, the nursing and medical teams.  Please re-consult if needed.  Thank you for inviting [place] to participate in this patient's Plan of Care.  Ladona Mow, MSN, RN, CNS, GNP, Leda Min, [employer], [employer] phone:  [phone]

## 2022-10-01 NOTE — Consult Note (Signed)
   PODIATRY CONSULTATION  NAME Kayla [name] MRN [id number] DOB 1940/09/04 DOA 09/30/2022   Reason for consult:  Chief Complaint  Patient presents with   Vascular Access Problem    Consult for wound care/dressing change orders  History of present illness: 82 y.o. female recent left foot partial ray amputation 09/04/2022 by Dr. Ardelle Anton.  ID monitoring 6 weeks IV daptomycin via PICC line.  Presented to the ED for complications from the PICC line.  HR 30-50s in the ED.  Patient admitted for monitoring.  Podiatry consulted for wound care orders of the amputation site.  Upon presentation this afternoon dressings had recently been changed by the nurse.  They are clean dry and intact.  Discharge orders in place; Home or self care.  Planning for discharge this evening.  Has an appointment tomorrow, 10/02/22, 8:45AM with Dr. Nicholes Rough for outpatient f/u.     Felecia Shelling, DPM [facility]  Dr. Felecia Shelling, DPM    [address] [place], [place] [address]                Office [phone]  Fax [phone]

## 2022-10-01 NOTE — H&P (Signed)
History and Physical    Patient: Kayla [name]:[id number] DOB: August 02, 1940 DOA: 09/30/2022 DOS: the patient was seen and examined on 10/01/2022 PCP: Ailene Ravel, MD  Patient coming from: Home  Chief Complaint:  Chief Complaint  Patient presents with   Vascular Access Problem   HPI: Kayla [name] is a 82 y.o. female with medical history significant of hypertension, hyperlipidemia, CAD, CHF, paroxysmal atrial fibrillation on Eliquis, SSS s/p PM, carotid artery disease, COPD, diabetes mellitus type 2 with polyneuropathy, anemia of chronic disease, hypothyroidism, OSA who presents with complaints of clogged PICC line.  She just recently been hospitalized from 5/2-5/14 for left amputation site infection.  MRI of the foot showed acute osteomyelitis patient had been started on broad-spectrum antibiotics and underwent left partial ray amputation on 5/3.  Blood cultures were positive for MRSA and there was concern for likely pacemaker lead infection which was extracted on 5/7.  ID had been consulted and recommended 6 weeks of IV daptomycin which PICC line was placed.  [facility] had [facility] and thereafter was discharged to home.  She states that she had been doing well until last night when PICC line became occluded. Patient complained of feeling dizzy and lightheaded with therapies, but states currently that she is ready to go back home.  Patient makes note that she had missed her medications last night and initial medications this morning due to being here in the hospital.  She states that her swelling in her legs is better and she knows that her kidney function has not been doing well for which she is scheduled to follow-up with Dr. Thedore Mins of [facility] next week.  In the emergency department patient was noted to have heart rates in the 30-50s with blood pressures maintained.  Axis was unclogged while in the emergency department.  Labs significant for WBC 12.5,  hemoglobin 8.6, platelets 511, potassium 4.3, BUN 45->48, creatinine 2.28-> 2.36, and glucose elevated to 280.  EKG showed a junctional rhythm at 41 bpm.  He had been consulted to evaluate.     Review of Systems: As mentioned in the history of present illness. All other systems reviewed and are negative. Past Medical History:  Diagnosis Date   Allergy    Anxiety    Arthritis    CAD (coronary artery disease)    Mild per remote cath in 2006, /    Nuclear, January, 2013, no significant abnormality,   Carotid artery disease (HCC)    Doppler, January, 2013, 0 39% RIC A., 40-59% LICA, stable   Cataract    CHF (congestive heart failure) (HCC)    Chronic kidney disease    Complication of anesthesia    wakes up "mean"   COPD 02/16/2007   Intolerant of Spiriva, tudorza Intolerant of Trelegy    COPD (chronic obstructive pulmonary disease) (HCC)    Coronary atherosclerosis 11/24/2008   Catheterization 2006, nonobstructive coronary disease   //   nuclear 2013, low risk     Diabetes mellitus, type 2 (HCC)    Diverticula of colon    DIVERTICULOSIS OF COLON 03/23/2007   Qualifier: Diagnosis of  By: Craige Cotta MD, Vineet     Dizziness    occasional   Dizzy spells 05/04/2011   DM (diabetes mellitus) (HCC) 03/23/2007   Qualifier: Diagnosis of  By: Craige Cotta MD, Vineet     Dyspnea    On exertion   Ejection fraction    Essential hypertension 02/16/2007  Qualifier: Diagnosis of  By: Earlene Plater CMA, Tammy     G E R D 03/23/2007   Qualifier: Diagnosis of  By: Craige Cotta MD, Zoila Shutter bladder disease 04/28/2016   Gastritis and gastroduodenitis    GERD (gastroesophageal reflux disease)    Goiter    hx of   Headache(784.0)    migraine   Hyperlipidemia    Hypertension    Hypothyroidism    HYPOTHYROIDISM, POSTSURGICAL 06/04/2008   Qualifier: Diagnosis of  By: Everardo All MD, Sean A    Left ventricular hypertrophy    Leg edema    occasional swelling   Leg swelling 12/09/2018   Myofascial pain syndrome,  cervical 01/06/2018   Obesity    OBESITY 11/24/2008   Qualifier: Diagnosis of  By: Vikki Ports     OBSTRUCTIVE SLEEP APNEA 02/16/2007   Auto CPAP 09/03/13 to 10/02/13 >> used on 30 of 30 nights with average 6 hrs and 3 min.  Average AHI is 3.1 with median CPAP 5 cm H2O and 95 th percentile CPAP 6 cm H2O.     Occipital neuralgia of right side 01/06/2018   OSA (obstructive sleep apnea)    cpap setting of 12   Pain in joint of right hip 01/21/2018   Palpitations 01/21/2011   48 hour Holter, 2015, scattered PACs and PVCs.    Paroxysmal atrial fibrillation (HCC)    Pneumonia    PONV (postoperative nausea and vomiting)    severe after every surgery   Pulmonary hypertension, secondary    RHINITIS 07/21/2010        RUQ abdominal pain    S/P left TKA 07/27/2011   Sinus node dysfunction (HCC) 11/08/2015   Sleep apnea    Spinal stenosis of cervical region 11/07/2014   Urinary frequency 12/09/2018   UTI (urinary tract infection) 08/15/2019   per pt report   VENTRICULAR HYPERTROPHY, LEFT 11/24/2008   Qualifier: Diagnosis of  By: Vikki Ports     Past Surgical History:  Procedure Laterality Date   ABDOMINAL HYSTERECTOMY     with appendectomy and colon polypectomy   AMPUTATION TOE Left 12/15/2019   Procedure: AMPUTATION  LEFT GREAT TOE;  Surgeon: Park Liter, DPM;  Location: [facility];  Service: Podiatry;  Laterality: Left;   AMPUTATION TOE Left 01/22/2022   Procedure: AMPUTATION TOE 4th digit;  Surgeon: Candelaria Stagers, DPM;  Location: [facility];  Service: Podiatry;  Laterality: Left;   AMPUTATION TOE Left 09/04/2022   Procedure: LEFT BIG TOE AMPUTAION;  Surgeon: Vivi Barrack, DPM;  Location: [facility];  Service: Podiatry;  Laterality: Left;   ANTERIOR CERVICAL DECOMP/DISCECTOMY FUSION N/A 11/07/2014   Procedure: Anterior Cervical diskectomy and fusion Cervical four-five, Cervical five-six, Cervical six-seven;  Surgeon: Donalee Citrin, MD;  Location: [facility];  Service: Neurosurgery;   Laterality: N/A;   APPENDECTOMY     ARTERY BIOPSY Right 02/16/2017   Procedure: BIOPSY TEMPORAL ARTERY;  Surgeon: Renford Dills, MD;  Location: [facility];  Service: Vascular;  Laterality: Right;   ARTERY BIOPSY Left 04/16/2020   Procedure: BIOPSY TEMPORAL ARTERY;  Surgeon: Renford Dills, MD;  Location: [facility];  Service: Vascular;  Laterality: Left;  LEFT TEMPLE   BACK SURGERY     CARDIAC CATHETERIZATION     CATARACT EXTRACTION W/ INTRAOCULAR LENS  IMPLANT, BILATERAL Bilateral    CHOLECYSTECTOMY N/A 04/29/2016   Procedure: LAPAROSCOPIC CHOLECYSTECTOMY WITH INTRAOPERATIVE CHOLANGIOGRAM;  Surgeon: Ovidio Kin, MD;  Location: [facility];  Service: General;  Laterality: N/A;   EP IMPLANTABLE DEVICE N/A 11/08/2015   MRI COMPATABLE -- Procedure: Pacemaker Implant;  Surgeon: Duke Salvia, MD;  Location: [facility] INVASIVE CV LAB;  Service: Cardiovascular;  Laterality: N/A;   INCISION AND DRAINAGE ABSCESS Right 05/30/2021   Procedure: IRRIGATION AND DEBRIDEMENT OF PREPATELLA BURSA;  Surgeon: Durene Romans, MD;  Location: [facility];  Service: Orthopedics;  Laterality: Right;  60 wants standard knee draping and pulse lavage   INCISION AND DRAINAGE ABSCESS Right 07/22/2021   Procedure: INCISION AND DRAINAGE ABSCESS RIGHT KNEE;  Surgeon: Durene Romans, MD;  Location: [facility];  Service: Orthopedics;  Laterality: Right;   INSERT / REPLACE / REMOVE PACEMAKER  2017   Dr. Clide [facility])   IR FLUORO GUIDE CV LINE RIGHT  07/25/2021   IR REMOVAL TUN CV CATH W/O FL  09/04/2021   IR US GUIDE VASC ACCESS RIGHT  07/25/2021   JOINT REPLACEMENT     LEAD EXTRACTION N/A 09/08/2022   Procedure: LEAD EXTRACTION;  Surgeon: Marinus Maw, MD;  Location: [facility];  Service: Cardiovascular;  Laterality: N/A;   LEAD EXTRACTION N/A 09/11/2022   Procedure: LEAD EXTRACTION;  Surgeon: Marinus Maw, MD;  Location: [facility];  Service: Cardiovascular;  Laterality: N/A;   LUMBAR DISC SURGERY  1990's   x 2   TEE  WITHOUT CARDIOVERSION N/A 09/07/2022   Procedure: TRANSESOPHAGEAL ECHOCARDIOGRAM;  Surgeon: Christell Constant, MD;  Location: [facility];  Service: Cardiovascular;  Laterality: N/A;   THYROIDECTOMY  2011   TOTAL KNEE ARTHROPLASTY  07/27/2011   Procedure: TOTAL KNEE ARTHROPLASTY;  Surgeon: Shelda Pal, MD;  Location: [facility];  Service: Orthopedics;  Laterality: Left;   UPPER ESOPHAGEAL ENDOSCOPIC ULTRASOUND (EUS) N/A 05/14/2016   Procedure: UPPER ESOPHAGEAL ENDOSCOPIC ULTRASOUND (EUS);  Surgeon: Rachael Fee, MD;  Location: [facility];  Service: Endoscopy;  Laterality: N/A;  radial only-will be done mod if needed   Social History:  reports that she quit smoking about 25 years ago. Her smoking use included cigarettes. She started smoking about 58 years ago. She has a 66.00 pack-year smoking history. She has never used smokeless tobacco. She reports that she does not drink alcohol and does not use drugs.  Allergies  Allergen Reactions   Dulaglutide Nausea Only, Other (See Comments) and Cough    TRULICITY made the patient sick to her stomach (only) several days after using it   Oxycodone Itching and Other (See Comments)    Pt still takes this med even thought it causes itching   Trelegy Ellipta [Fluticasone-Umeclidin-Vilant] Nausea And Vomiting, Other (See Comments) and Cough    Made the patient sick to her stomach (only) several days after using it     Family History  Problem Relation Age of Onset   Heart Problems Mother    Heart defect Mother        valve problems   Heart Problems Father    Heart disease Brother    Lung cancer Daughter        non small cell    Diabetes Maternal Aunt    Diabetes Maternal Uncle    Throat cancer Brother    Colon cancer Neg Hx    Colon polyps Neg Hx    Esophageal cancer Neg Hx    Rectal cancer Neg Hx    Stomach cancer Neg Hx     Prior to Admission medications   Medication Sig Start Date End Date Taking? Authorizing Provider  acetaminophen (TYLENOL) 500 MG tablet Take 1,000-1,500 mg by mouth every 8 (eight) hours as needed for mild pain, moderate pain or headache.   Yes [provider]  albuterol (PROVENTIL) (2.5 MG/3ML) 0.083% nebulizer solution Take 3 mLs (2.5 mg total) by nebulization every 2 (two) hours as needed for wheezing or shortness of breath. 07/03/22  Yes Parrett, Tammy S, NP  albuterol (VENTOLIN HFA) 108 (90 Base) MCG/ACT inhaler Inhale 2 puffs into the lungs every 6 (six) hours as needed for wheezing or shortness of breath. 07/03/22  Yes Parrett, Tammy S, NP  allopurinol (ZYLOPRIM) 100 MG tablet Take 100 mg by mouth 2 (two) times daily. 04/16/19  Yes [provider]  amiodarone (PACERONE) 200 MG tablet Take 1 tablet (200 mg total) by mouth daily. 05/22/22  Yes Jake Bathe, MD  amLODipine (NORVASC) 5 MG tablet Take 2 tablets (10 mg total) by mouth daily. 09/15/22  Yes Pokhrel, Laxman, MD  ascorbic acid (VITAMIN C) 500 MG tablet Take 500 mg by mouth daily with lunch.   Yes [provider]  atorvastatin (LIPITOR) 40 MG tablet Take 1 tablet (40 mg total) by mouth daily. Patient taking differently: Take 40 mg by mouth at bedtime. 05/22/22  Yes Jake Bathe, MD  Budeson-Glycopyrrol-Formoterol (BREZTRI AEROSPHERE) 160-9-4.8 MCG/ACT AERO Inhale 2 puffs into the lungs in the morning and at bedtime. 07/03/22  Yes Parrett, Tammy S, NP  Carboxymethylcellulose Sodium (ARTIFICIAL TEARS OP) Place 1 drop into both eyes daily as needed (dry eyes).   Yes [provider]  daptomycin (CUBICIN) IVPB Inject 600 mg into the vein every other day. 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  First Dose: Yes Last Day of Therapy:  10/20/22 Labs - Once weekly:  CBC/D, BMP, and CPK Labs - Every other week:  ESR and CRP Method of administration: IV Push Method of administration may be changed at the discretion of home infusion pharmacist based upon assessment of the patient and/or caregiver's ability  to self-administer the medication ordered. 09/15/22 10/26/22 Yes Pokhrel, Laxman, MD  ELIQUIS 2.5 MG TABS tablet TAKE 1 TABLET TWICE DAILY 04/20/22  Yes Skains, Veverly Fells, MD  FARXIGA 10 MG TABS tablet Take 10 mg by mouth daily.   Yes [provider]  ferrous sulfate 325 (65 FE) MG tablet Take 1 tablet (325 mg total) by mouth daily with breakfast. 01/26/20  Yes Duke Salvia, MD  fluticasone [facility]) 50 MCG/ACT nasal spray USE 2 SPRAYS NASALLY DAILY Patient taking differently: Place 1 spray into both nostrils daily as needed for allergies. USE 2 SPRAYS NASALLY DAILY 02/26/17  Yes Byrum, Les Pou, MD  gabapentin (NEURONTIN) 100 MG capsule Take 2 capsules (200 mg total) by mouth 3 (three) times daily. 09/15/22  Yes Pokhrel, Laxman, MD  guaiFENesin (ROBITUSSIN) 100 MG/5ML liquid Take 5 mLs by mouth every 4 (four) hours as needed for cough or to loosen phlegm.   Yes [provider]  HYDROcodone-acetaminophen (NORCO) 10-325 MG tablet Take 1 tablet by mouth every 6 (six) hours as needed. 09/15/22  Yes Pokhrel, Laxman, MD  HYDROXYZINE HCL PO Take 25 mg by mouth at bedtime as needed (insomnia).   Yes [provider]  insulin aspart (NOVOLOG) 100 UNIT/ML FlexPen Inject 0-20 Units into the skin See admin instructions. Inject 0-20 units into the skin three times a day before meals "as needed," per SLIDING SCALE   Yes [provider]  insulin glargine (LANTUS) 100 UNIT/ML injection Inject 0.55 mLs (55 Units total)  into the skin 2 (two) times daily. Patient taking differently: Inject 25-50 Units into the skin See admin instructions. Inject 25 units subcutaneously daily in the morning. Inject 50 units subcutaneously daily at bedtime.  Patient report 90 units in the morning 25 units at bedtime, was adamant regarding dosage. 07/27/21  Yes Regalado, Belkys A, MD  levothyroxine (SYNTHROID) 200 MCG tablet Take 200 mcg by mouth daily before breakfast.   Yes [provider]   loratadine (CLARITIN) 10 MG tablet Take 1 tablet (10 mg total) by mouth daily. 09/16/22  Yes Pokhrel, Laxman, MD  losartan (COZAAR) 25 MG tablet Take 25 mg by mouth daily. 03/07/22  Yes [provider]  melatonin 5 MG TABS Take 1 tablet (5 mg total) by mouth at bedtime as needed. 09/15/22  Yes Pokhrel, Laxman, MD  polyethylene glycol (MIRALAX / GLYCOLAX) 17 g packet Take 17 g by mouth daily as needed for mild constipation or moderate constipation. 09/15/22  Yes Pokhrel, Laxman, MD  sennosides-docusate sodium (SENOKOT-S) 8.6-50 MG tablet Take 1 tablet by mouth daily.   Yes [provider]  torsemide (DEMADEX) 20 MG tablet Take 2 tablets (40 mg total) by mouth daily. 09/15/22  Yes Pokhrel, Laxman, MD  vitamin B-12 (CYANOCOBALAMIN) 1000 MCG tablet Take 1,000 mcg by mouth daily with lunch.   Yes [provider]  Vitamin D3 (VITAMIN D) 25 MCG tablet Take 1,000 Units by mouth daily with lunch.   Yes [provider]  Alcohol Swabs (B-D SINGLE USE SWABS REGULAR) PADS  11/24/19   [provider]  Blood Glucose Calibration (TRUE METRIX LEVEL 1) Low SOLN  11/27/19   [provider]  nitroGLYCERIN (NITROSTAT) 0.4 MG SL tablet Place 1 tablet (0.4 mg total) under the tongue every 5 (five) minutes as needed for chest pain. 04/13/19   Jake Bathe, MD  PRESCRIPTION MEDICATION CPAP- At bedtime and as needed during any time of rest    [provider]  TRUE METRIX BLOOD GLUCOSE TEST test strip  09/18/17   [provider]  TRUEplus Lancets 28G MISC  11/24/19   [provider]    Physical Exam: Vitals:   10/01/22 0200 10/01/22 0442 10/01/22 0741 10/01/22 1150  BP: 121/82 (!) 141/58 (!) 160/84 (!) 164/47  Pulse: (!) 55 (!) 55 (!) 55 (!) 55  Resp: (!) 22 17 19 18   Temp:  98 F (36.7 C) 97.7 F (36.5 C) 97.6 F (36.4 C)  TempSrc:  Oral Oral Oral  SpO2: 96% 99% 97% 92%  Weight:  100.4 kg    Height:         Constitutional: Elderly  female who appears to be in no acute distress Eyes: PERRL, lids and conjunctivae normal ENMT: Mucous membranes are moist.  Neck: normal, supple,   Respiratory: clear to auscultation bilaterally, no wheezing, no crackles. Normal respiratory effort. No accessory muscle use.  Cardiovascular: Bradycardic.  Trace right lower extremity swelling.  No significant lower extremity swelling on left. Abdomen: no tenderness, no masses palpated. No hepatosplenomegaly. Bowel sounds positive.  Musculoskeletal: no clubbing / cyanosis.  Status post ray amputation on the left Skin: no rashes, lesions, ulcers. No induration Neurologic: CN 2-12 grossly intact. Sensation intact, DTR normal. Strength 5/5 in all 4.  Psychiatric: Normal judgment and insight. Alert and oriented x 3. Normal mood.   Data Reviewed:  EKG reveals a junctional rhythm at 42 bpm.  Reviewed labs, imaging, and pertinent records as noted above in HPI Assessment and Plan:  Bradycardia/junctional rhythm Patient reported feeling lightheaded and dizzy.  Initial reports were that heart rates were in the mid 30s, but since being on the floor had not been noted to have heart rates less than 40s on telemetry.  Cardiology and subsequently EP had evaluated and recommended continuing amiodarone and avoid AV nodal blocking agents. -Admit to a telemetry bed -Physical therapy to eval -Appreciate cardiology and EP cardiology consultative services  Complication of central venous access Resolved.  PICC was noted to be clogged at home, but was addressed while in the ED and functioning properly at this time.  Leukocytosis Similar to prior.  WBC 12.5.  Patient was noted to be afebrile. -Continue to monitor  MRSA bacteremia Vegetation on pacemaker lead Prior to arrival.  Initial blood cultures positive for MRSA.  Patient had TEE on 5/6 that showed vegetation on RV lead which was extracted on 5/7.  ID recommended PICC line placement and continue IV  antibiotics for 6 weeks from 5/7. -Continue daptomycin  Acute kidney injury on chronic kidney disease stage IV Creatinine noted to be elevated up to 2.36 with BUN 48.  Baseline creatinine prior to discharge from hospital was 1.94.  Possibly related to patient missing her medications including diuretics.  Patient followed by Dr. Thedore Mins of nephrology in the outpatient setting. -Continue outpatient follow-up with [facility]  Osteomyelitis left great toe Patient s/p left partial first ray amputation by Dr. Loreta Ave on 5/3.  She is scheduled scheduled to have follow-up in the outpatient setting tomorrow. -Continue local wound care -Continue outpatient follow-up with podiatry  Chronic respiratory failure with hypoxia COPD, without acute exacerbation Patient on 3 L of oxygen at baseline.  No wheezing noted on physical exam. -Continue nasal cannula oxygen maintain O2 saturation greater than 90% -Continue home inhaler, and albuterol nebs as needed  Paroxysmal atrial fibrillation Sick sinus syndrome s/p PM Since recent lead extraction patient had been discontinued off of metoprolol due to bradycardia.  EP recommended holding all AV nodal blocking agents. -Continue amiodarone and Eliquis  Diabetes mellitus type 2 with diabetic neuropathy Hemoglobin A1c was 6.8. -Hypoglycemic protocols -CBGs before every meal with resistant SSI -Plan to resume home regimen at discharge  Hypothyroidism -Continue Synthroid    Anemia of chronic disease Hemoglobin 8.6  which appears around patient's baseline -Continue to monitor  Thrombocytosis Similar to prior.  Platelet count 498  OSA -Continue sleeping device at night  Obesity  BMI 36.83 kg/m  DVT prophylaxis: Eliquis Advance Care Planning:   Code Status: DNR   Consults: Cardiology, EP  Family Communication: Husband updated at bedside  Severity of Illness: The appropriate patient status for this patient is OBSERVATION.  Observation status is judged to be reasonable and necessary in order to provide the required intensity of service to ensure the patient's safety. The patient's presenting symptoms, physical exam findings, and initial radiographic and laboratory data in the context of their medical condition is felt to place them at decreased risk for further clinical deterioration. Furthermore, it is anticipated that the patient will be medically stable for discharge from the hospital within 2 midnights of admission.   Author: Clydie Braun, MD 10/01/2022 12:21 PM  For on call review [email or web address].

## 2022-10-01 NOTE — ED Provider Notes (Signed)
[facility] Emergency Department Provider Note MRN:  [id number]  Arrival date & time: 10/01/22     Chief Complaint   Vascular Access Problem   History of Present Illness   Kayla [name] is a 82 y.o. year-old female presents to the ED with chief complaint of PICC line problem.  She states that she has been taking daptomycin through PICC line after having been diagnosed with MRSA.  She had vegetations on her pacemaker.  She also had a recent removal of this pacemaker as well as amputation of the toes on her left foot.  She states that she has also been feeling dizzy and lightheaded.  She states that she struggles to do any rehab because of her lightheadedness.  Marland Kitchen  History provided by patient.   Review of Systems  Pertinent positive and negative review of systems noted in HPI.    Physical Exam   Vitals:   10/01/22 0118 10/01/22 0200  BP:  121/82  Pulse:  (!) 55  Resp:  (!) 22  Temp: 98.1 F (36.7 C)   SpO2:  96%    CONSTITUTIONAL:  non toxic-appearing, NAD NEURO:  Alert and oriented x 3, CN 3-12 grossly intact EYES:  eyes equal and reactive ENT/NECK:  Supple, no stridor  CARDIO:  bradycardic, regular rhythm, appears well-perfused  PULM:  No respiratory distress,  GI/GU:  non-distended,  MSK/SPINE:  No gross deformities, no edema, moves all extremities, left toe amputation SKIN:  no rash, atraumatic   *Additional and/or pertinent findings included in MDM below  Diagnostic and Interventional Summary    EKG Interpretation  Date/Time:    Ventricular Rate:    PR Interval:    QRS Duration:   QT Interval:    QTC Calculation:   R Axis:     Text Interpretation:         Labs Reviewed  CBC WITH DIFFERENTIAL/PLATELET - Abnormal; Notable for the following components:      Result Value   WBC 12.5 (*)    RBC 3.01 (*)    Hemoglobin 8.6 (*)    HCT 28.6 (*)    Platelets 511 (*)    Neutro Abs 8.8 (*)    Abs Immature Granulocytes 0.09 (*)     All other components within normal limits  BASIC METABOLIC PANEL - Abnormal; Notable for the following components:   Glucose, Bld 167 (*)    BUN 45 (*)    Creatinine, Ser 2.28 (*)    Calcium 8.6 (*)    GFR, Estimated 21 (*)    All other components within normal limits  MAGNESIUM - Abnormal; Notable for the following components:   Magnesium 1.6 (*)    All other components within normal limits  CBC WITH DIFFERENTIAL/PLATELET  COMPREHENSIVE METABOLIC PANEL  MAGNESIUM    No orders to display    Medications  sodium chloride flush (NS) 0.9 % injection 10-40 mL (has no administration in time range)  Chlorhexidine Gluconate Cloth 2 % PADS 6 each (has no administration in time range)  acetaminophen (TYLENOL) tablet 650 mg (has no administration in time range)    Or  acetaminophen (TYLENOL) suppository 650 mg (has no administration in time range)  melatonin tablet 3 mg (has no administration in time range)  ondansetron (ZOFRAN) injection 4 mg (has no administration in time range)  DAPTOmycin (CUBICIN) 600 mg in sodium chloride 0.9 % IVPB (0 mg Intravenous Stopped 10/01/22 0113)  heparin lock flush 100 unit/mL (500 Units Intracatheter Given  10/01/22 0114)  magnesium sulfate IVPB 2 g 50 mL (0 g Intravenous Stopped 10/01/22 0305)     Procedures  /  Critical Care .Critical Care  Performed by: Roxy Horseman, PA-C Authorized by: Roxy Horseman, PA-C   Critical care provider statement:    Critical care time (minutes):  35   Critical care was necessary to treat or prevent imminent or life-threatening deterioration of the following conditions:  Circulatory failure   Critical care was time spent personally by me on the following activities:  Development of treatment plan with patient or surrogate, discussions with consultants, evaluation of patient's response to treatment, examination of patient, ordering and review of laboratory studies, ordering and review of radiographic studies, ordering and  performing treatments and interventions, pulse oximetry, re-evaluation of patient's condition and review of old charts   ED Course and Medical Decision Making  I have reviewed the triage vital signs, the nursing notes, and pertinent available records from the EMR.  Social Determinants Affecting Complexity of Care: Patient has no clinically significant social determinants affecting this chief complaint..   ED Course:    Medical Decision Making Patient here because her PICC line was clogged.  We are able to flush the PICC line as well as give her a dose of her daptomycin.  She was noted to have heart rate in the mid 30s.  She states that she does feel dizzy.  Will consult cardiology for evaluation.  Cardiology recommends admission.  Will need PT evaluation while on HR monitor and consideration for temporary pacing vs continued observation while completing antibiotics.  Amount and/or Complexity of Data Reviewed Labs: ordered.    Details: Potassium is normal, magnesium 1.6, will supplement, creatinine slightly higher than normal at 2.28 ECG/medicine tests: ordered.  Risk OTC drugs. Prescription drug management. Decision regarding hospitalization.     Consultants: I consulted with Dr. Hetty Ely, who recommends admission to medicine.  Will need to have PT eval with HR monitor on.  Cards to follow.. Appreciate Dr. Arlean Hopping for admitting.  Treatment and Plan: Patient's exam and diagnostic results are concerning for symptomatic bradycardia.  Feel that patient will need admission to the hospital for further treatment and evaluation.    Final Clinical Impressions(s) / ED Diagnoses     ICD-10-CM   1. Bradycardia  R00.1       ED Discharge Orders     None         Discharge Instructions Discussed with and Provided to Patient:   Discharge Instructions   None      Roxy Horseman, PA-C 10/01/22 0356    Palumbo, April, MD 10/01/22 [phone]

## 2022-10-01 NOTE — ED Notes (Signed)
ED TO INPATIENT HANDOFF REPORT  ED Nurse Name and Phone #: [phone]  S Name/Age/Gender Kayla [name] 82 y.o. female Room/Bed: 033C/033C  Code Status   Code Status: DNR  Home/SNF/Other Home Patient oriented to: self, place, time, and situation Is this baseline? Yes   Triage Complete: Triage complete  Chief Complaint Bradycardia [R00.1]  Triage Note The pts picc line has been stopped up since yesterday.  The picc line is in her rt upper arm  she just left [facility] yesterday.   The attempts have been made to open it  back up unsuccessful  The pt  missed her antibiotic yesterday and she needs that antibiotic asap  she is being treated for mersa  with iv meds   Allergies Allergies  Allergen Reactions   Dulaglutide Nausea Only, Other (See Comments) and Cough    TRULICITY made the patient sick to her stomach (only) several days after using it   Oxycodone Itching and Other (See Comments)    Pt still takes this med even thought it causes itching   Trelegy Ellipta [Fluticasone-Umeclidin-Vilant] Nausea And Vomiting, Other (See Comments) and Cough    Made the patient sick to her stomach (only) several days after using it     Level of Care/Admitting Diagnosis ED Disposition     ED Disposition  Admit   Condition  --   Comment  Hospital [facility] [[id number]]  Level of Care: Telemetry Cardiac [[id number]]  May admit patient to [facility] or [facility] if equivalent level of care is available:: No  Covid Evaluation: Asymptomatic - no recent exposure (last 10 days) testing not required  Diagnosis: Bradycardia [191850]  Admitting Physician: Angie Fava [[id number]]  Attending Physician: Angie Fava [[id number]]  Certification:: I certify this patient will need inpatient services for at least 2 midnights  Estimated Length of Stay: 2          B Medical/Surgery History Past Medical History:  Diagnosis Date   Allergy    Anxiety     Arthritis    CAD (coronary artery disease)    Mild per remote cath in 2006, /    Nuclear, January, 2013, no significant abnormality,   Carotid artery disease (HCC)    Doppler, January, 2013, 0 39% RIC A., 40-59% LICA, stable   Cataract    CHF (congestive heart failure) (HCC)    Chronic kidney disease    Complication of anesthesia    wakes up "mean"   COPD 02/16/2007   Intolerant of Spiriva, tudorza Intolerant of Trelegy    COPD (chronic obstructive pulmonary disease) (HCC)    Coronary atherosclerosis 11/24/2008   Catheterization 2006, nonobstructive coronary disease   //   nuclear 2013, low risk     Diabetes mellitus, type 2 (HCC)    Diverticula of colon    DIVERTICULOSIS OF COLON 03/23/2007   Qualifier: Diagnosis of  By: Craige Cotta MD, Vineet     Dizziness    occasional   Dizzy spells 05/04/2011   DM (diabetes mellitus) (HCC) 03/23/2007   Qualifier: Diagnosis of  By: Craige Cotta MD, Vineet     Dyspnea    On exertion   Ejection fraction    Essential hypertension 02/16/2007   Qualifier: Diagnosis of  By: Earlene Plater CMA, Tammy     G E R D 03/23/2007   Qualifier: Diagnosis of  By: Craige Cotta MD, Vineet     Gall bladder disease 04/28/2016   Gastritis and gastroduodenitis  GERD (gastroesophageal reflux disease)    Goiter    hx of   Headache(784.0)    migraine   Hyperlipidemia    Hypertension    Hypothyroidism    HYPOTHYROIDISM, POSTSURGICAL 06/04/2008   Qualifier: Diagnosis of  By: Everardo All MD, Sean A    Left ventricular hypertrophy    Leg edema    occasional swelling   Leg swelling 12/09/2018   Myofascial pain syndrome, cervical 01/06/2018   Obesity    OBESITY 11/24/2008   Qualifier: Diagnosis of  By: Vikki Ports     OBSTRUCTIVE SLEEP APNEA 02/16/2007   Auto CPAP 09/03/13 to 10/02/13 >> used on 30 of 30 nights with average 6 hrs and 3 min.  Average AHI is 3.1 with median CPAP 5 cm H2O and 95 th percentile CPAP 6 cm H2O.     Occipital neuralgia of right side 01/06/2018   OSA (obstructive  sleep apnea)    cpap setting of 12   Pain in joint of right hip 01/21/2018   Palpitations 01/21/2011   48 hour Holter, 2015, scattered PACs and PVCs.    Paroxysmal atrial fibrillation (HCC)    Pneumonia    PONV (postoperative nausea and vomiting)    severe after every surgery   Pulmonary hypertension, secondary    RHINITIS 07/21/2010        RUQ abdominal pain    S/P left TKA 07/27/2011   Sinus node dysfunction (HCC) 11/08/2015   Sleep apnea    Spinal stenosis of cervical region 11/07/2014   Urinary frequency 12/09/2018   UTI (urinary tract infection) 08/15/2019   per pt report   VENTRICULAR HYPERTROPHY, LEFT 11/24/2008   Qualifier: Diagnosis of  By: Vikki Ports     Past Surgical History:  Procedure Laterality Date   ABDOMINAL HYSTERECTOMY     with appendectomy and colon polypectomy   AMPUTATION TOE Left 12/15/2019   Procedure: AMPUTATION  LEFT GREAT TOE;  Surgeon: Park Liter, DPM;  Location: [facility];  Service: Podiatry;  Laterality: Left;   AMPUTATION TOE Left 01/22/2022   Procedure: AMPUTATION TOE 4th digit;  Surgeon: Candelaria Stagers, DPM;  Location: [facility];  Service: Podiatry;  Laterality: Left;   AMPUTATION TOE Left 09/04/2022   Procedure: LEFT BIG TOE AMPUTAION;  Surgeon: Vivi Barrack, DPM;  Location: [facility];  Service: Podiatry;  Laterality: Left;   ANTERIOR CERVICAL DECOMP/DISCECTOMY FUSION N/A 11/07/2014   Procedure: Anterior Cervical diskectomy and fusion Cervical four-five, Cervical five-six, Cervical six-seven;  Surgeon: Donalee Citrin, MD;  [facility];  Service: Neurosurgery;  Laterality: N/A;   APPENDECTOMY     ARTERY BIOPSY Right 02/16/2017   Procedure: BIOPSY TEMPORAL ARTERY;  Surgeon: Renford Dills, MD;  Location: [facility] OR[facility];  Service: Vascular;  Laterality: Right;   ARTERY BIOPSY Left 04/16/2020   Procedure: BIOPSY TEMPORAL ARTERY;  Surgeon: Renford Dills, MD;  Location: [facility] ORS;  Service: Vascular;  Laterality: Left;  LEFT TEMPLE   BACK  SURGERY     CARDIAC CATHETERIZATION     CATARACT EXTRACTION W/ INTRAOCULAR LENS  IMPLANT, BILATERAL Bilateral    CHOLECYSTECTOMY N/A 04/29/2016   Procedure: LAPAROSCOPIC CHOLECYSTECTOMY WITH INTRAOPERATIVE CHOLANGIOGRAM;  Surgeon: Ovidio Kin, MD;  Location: [facility];  Service: General;  Laterality: N/A;   EP IMPLANTABLE DEVICE N/A 11/08/2015   MRI COMPATABLE -- Procedure: Pacemaker Implant;  Surgeon: Duke Salvia, MD;  [facility];  Service: Cardiovascular;  Laterality: N/A;   INCISION AND DRAINAGE ABSCESS Right  05/30/2021   Procedure: IRRIGATION AND DEBRIDEMENT OF PREPATELLA BURSA;  Surgeon: Durene Romans, MD;  Location: [facility];  Service: Orthopedics;  Laterality: Right;  60 wants standard knee draping and pulse lavage   INCISION AND DRAINAGE ABSCESS Right 07/22/2021   Procedure: INCISION AND DRAINAGE ABSCESS RIGHT KNEE;  Surgeon: Durene Romans, MD;  Location: [facility];  Service: Orthopedics;  Laterality: Right;   INSERT / REPLACE / REMOVE PACEMAKER  2017   Dr. [facility])   IR FLUORO GUIDE CV LINE RIGHT  07/25/2021   IR REMOVAL TUN CV CATH W/O FL  09/04/2021   IR US GUIDE VASC ACCESS RIGHT  07/25/2021   JOINT REPLACEMENT     LEAD EXTRACTION N/A 09/08/2022   Procedure: LEAD EXTRACTION;  Surgeon: Marinus Maw, MD;  [facility];  Service: Cardiovascular;  Laterality: N/A;   LEAD EXTRACTION N/A 09/11/2022   Procedure: LEAD EXTRACTION;  Surgeon: Marinus Maw, MD;  [facility];  Service: Cardiovascular;  Laterality: N/A;   LUMBAR DISC SURGERY  1990's   x 2   TEE WITHOUT CARDIOVERSION N/A 09/07/2022   Procedure: TRANSESOPHAGEAL ECHOCARDIOGRAM;  Surgeon: Christell Constant, MD;  [facility];  Service: Cardiovascular;  Laterality: N/A;   THYROIDECTOMY  2011   TOTAL KNEE ARTHROPLASTY  07/27/2011   Procedure: TOTAL KNEE ARTHROPLASTY;  Surgeon: Shelda Pal, MD;  Location: [facility];  Service: Orthopedics;  Laterality: Left;    UPPER ESOPHAGEAL ENDOSCOPIC ULTRASOUND (EUS) N/A 05/14/2016   Procedure: UPPER ESOPHAGEAL ENDOSCOPIC ULTRASOUND (EUS);  Surgeon: Rachael Fee, MD;  Location: [facility];  Service: Endoscopy;  Laterality: N/A;  radial only-will be done mod if needed     A IV Location/Drains/Wounds Patient Lines/Drains/Airways Status     Active Line/Drains/Airways     Name Placement date Placement time Site Days   PICC Single Lumen 09/10/22 Right Brachial 37 cm 0 cm 09/10/22  1111  Brachial  21   External Urinary Catheter 09/09/22  2025  --  22   Wound / Incision (Open or Dehisced) 07/22/21 Other (Comment);Skin tear Knee Right Circular 1cm by 1cm 07/22/21  0300  Knee  436   Wound / Incision (Open or Dehisced) 01/20/22 Skin tear Arm Left;Lower;Posterior 01/20/22  1957  Arm  254   Wound / Incision (Open or Dehisced) 01/20/22 Skin tear Leg Left;Posterior;Lower 01/20/22  1957  Leg  254   Wound / Incision (Open or Dehisced) 01/20/22 Skin tear Leg Left;Lower 01/20/22  1957  Leg  254   Wound / Incision (Open or Dehisced) 01/23/22 Skin tear Wrist Posterior;Right 1 inch skin tear occurred while removing tape to remove the occluded peripheral IV lock. Dressing applied. 01/23/22  1507  Wrist  251            Intake/Output Last 24 hours No intake or output data in the 24 hours ending 10/01/22 0409  Labs/Imaging Results for orders placed or performed during the hospital encounter of 09/30/22 (from the past 48 hour(s))  CBC with Differential     Status: Abnormal   Collection Time: 10/01/22 12:11 AM  Result Value Ref Range   WBC 12.5 (H) 4.0 - 10.5 K/uL   RBC 3.01 (L) 3.87 - 5.11 MIL/uL   Hemoglobin 8.6 (L) 12.0 - 15.0 g/dL   HCT 16.1 (L) 09.6 - 04.5 %   MCV 95.0 80.0 - 100.0 fL   MCH 28.6 26.0 - 34.0 pg   MCHC 30.1 30.0 - 36.0  g/dL   RDW 16.1 09.6 - 04.5 %   Platelets 511 (H) 150 - 400 K/uL   nRBC 0.0 0.0 - 0.2 %   Neutrophils Relative % 70 %   Neutro Abs 8.8 (H) 1.7 - 7.7 K/uL   Lymphocytes Relative  19 %   Lymphs Abs 2.4 0.7 - 4.0 K/uL   Monocytes Relative 7 %   Monocytes Absolute 0.8 0.1 - 1.0 K/uL   Eosinophils Relative 2 %   Eosinophils Absolute 0.3 0.0 - 0.5 K/uL   Basophils Relative 1 %   Basophils Absolute 0.1 0.0 - 0.1 K/uL   Immature Granulocytes 1 %   Abs Immature Granulocytes 0.09 (H) 0.00 - 0.07 K/uL    Comment: Performed at [facility], [address], [place], [place] [address]  Basic metabolic panel     Status: Abnormal   Collection Time: 10/01/22 12:11 AM  Result Value Ref Range   Sodium 140 135 - 145 mmol/L   Potassium 4.3 3.5 - 5.1 mmol/L   Chloride 103 98 - 111 mmol/L   CO2 27 22 - 32 mmol/L   Glucose, Bld 167 (H) 70 - 99 mg/dL    Comment: Glucose reference range applies only to samples taken after fasting for at least 8 hours.   BUN 45 (H) 8 - 23 mg/dL   Creatinine, Ser 1.91 (H) 0.44 - 1.00 mg/dL   Calcium 8.6 (L) 8.9 - 10.3 mg/dL   GFR, Estimated 21 (L) >60 mL/min    Comment: (NOTE) Calculated using the CKD-EPI Creatinine Equation (2021)    Anion gap 10 5 - 15    Comment: Performed at [facility], [address], [place], [place] [address]  Magnesium     Status: Abnormal   Collection Time: 10/01/22 12:11 AM  Result Value Ref Range   Magnesium 1.6 (L) 1.7 - 2.4 mg/dL    Comment: Performed at [facility], [address] [place], [place] [address]   No results found.  Pending Labs Unresulted Labs (From admission, onward)     Start     Ordered   10/01/22 0500  CBC with Differential/Platelet  Tomorrow morning,   R        10/01/22 0353   10/01/22 0500  Comprehensive metabolic panel  Tomorrow morning,   R        10/01/22 0353   10/01/22 0500  Magnesium  Tomorrow morning,   R        10/01/22 0353            Vitals/Pain Today's Vitals   10/01/22 0015 10/01/22 0100 10/01/22 0118 10/01/22 0200  BP: 118/76 135/71  121/82  Pulse: (!) 50 (!) 41  (!) 55  Resp: 16 18  (!) 22  Temp:   98.1 F (36.7 C)   TempSrc:   Oral    SpO2: 97% 98%  96%  Weight:      Height:      PainSc:        Isolation Precautions No active isolations  Medications Medications  sodium chloride flush (NS) 0.9 % injection 10-40 mL (has no administration in time range)  Chlorhexidine Gluconate Cloth 2 % PADS 6 each (has no administration in time range)  acetaminophen (TYLENOL) tablet 650 mg (has no administration in time range)    Or  acetaminophen (TYLENOL) suppository 650 mg (has no administration in time range)  melatonin tablet 3 mg (has no administration in time range)  ondansetron (  ZOFRAN) injection 4 mg (has no administration in time range)  DAPTOmycin (CUBICIN) 600 mg in sodium chloride 0.9 % IVPB (0 mg Intravenous Stopped 10/01/22 0113)  heparin lock flush 100 unit/mL (500 Units Intracatheter Given 10/01/22 0114)  magnesium sulfate IVPB 2 g 50 mL (0 g Intravenous Stopped 10/01/22 0305)    Mobility walks with person assist     Focused Assessments     R Recommendations: See Admitting Provider Note  Report given to:   Additional Notes: Pt will be seen by PT in the morning to decide if pt will be DC home.

## 2022-10-01 NOTE — Consult Note (Signed)
Cardiology Consultation   Patient ID: [name] MRN: [id number]; DOB: [birth year]  Admit date: 09/30/2022 Date of Consult: 10/01/2022  PCP:  Ailene Ravel, MD   [facility] Providers Cardiologist:  Donato Schultz, MD  Electrophysiologist:  Sherryl Manges, MD       Patient Profile:   Kayla [name] is a 82 y.o. female with a hx of T2DM, HTN, CAD, HFpEF, sick sinus syndrome status post pacemaker and paroxysmal A-fib on Eliquis, COPD, CKD, chronic venous insufficiency and diabetic polyneuropathy, with recent left toe osteomyelitis status post amputation complicated by MRSA bacteremia and pacemaker lead infection status post extraction on 5/7 with continuation of IV antibiotics who is being seen 10/01/2022 for the evaluation of lightheadedness/bradycardia at the request of ED.  History of Present Illness:   Kayla [name] has a complicated past medical history as above.  She initially presented with a diabetic foot infection and acute osteomyelitis status post amputation was then found to have complicated MRSA bacteremia with pacemaker lead infection for which she underwent lead extraction on 5/7 with documented clearance of her infection.  She remains on IV daptomycin with PICC placement on 5/9 to complete a total course of 6 weeks.  Outside of deconditioning she has done relatively well from an infection standpoint.  She presents to the ED after her port occluded and at that time was noted to have sinus bradycardia in the 40s to 50s.  After further prompting she notes that she has had considerable difficulty with physical therapy and exertion due to persistent lightheadedness/dizziness and orthostasis symptoms.  Currently she does not experience lightheadedness at rest and her blood pressure is stable.  She denies syncope, palpitations, chest pain, or worsening shortness of breath.  She does note that she is deconditioned and had not been eating well given her prolonged  hospitalization but feels that she can push through most things except for this lightheadedness that she finds so profound.  She did have a temp perm in place with resting heart rates in the 50s to 70s no evidence of chronotropic incompetence/sick sinus syndrome at that time.  She has been held from any AV nodal blocking agents but has continued on amiodarone.  She did have a Zio patch for which she just mailed back yesterday.  On arrival to the ED she was afebrile, hemodynamically stable with heart rates in the 40s to 50s.  Labs were remarkable for for creatinine of 2.28 up from 1.9.  Mild leukocytosis and anemia of 8.6 (stable).  EKG is of poor quality but looks to be a regular rhythm with heart rates in the 40s without clear P waves suggesting possible junctional escape; however, the majority of her telemetry looks like sinus bradycardia with PACs.  Past Medical History:  Diagnosis Date   Allergy    Anxiety    Arthritis    CAD (coronary artery disease)    Mild per remote cath in 2006, /    Nuclear, January, 2013, no significant abnormality,   Carotid artery disease (HCC)    Doppler, January, 2013, 0 39% RIC A., 40-59% LICA, stable   Cataract    CHF (congestive heart failure) (HCC)    Chronic kidney disease    Complication of anesthesia    wakes up "mean"   COPD 02/16/2007   Intolerant of Spiriva, tudorza Intolerant of Trelegy    COPD (chronic obstructive pulmonary disease) (HCC)    Coronary atherosclerosis 11/24/2008   Catheterization 2006, nonobstructive coronary disease   //  nuclear 2013, low risk     Diabetes mellitus, type 2 (HCC)    Diverticula of colon    DIVERTICULOSIS OF COLON 03/23/2007   Qualifier: Diagnosis of  By: Craige Cotta MD, Vineet     Dizziness    occasional   Dizzy spells 05/04/2011   DM (diabetes mellitus) (HCC) 03/23/2007   Qualifier: Diagnosis of  By: Craige Cotta MD, Vineet     Dyspnea    On exertion   Ejection fraction    Essential hypertension 02/16/2007    Qualifier: Diagnosis of  By: Earlene Plater CMA, Tammy     G E R D 03/23/2007   Qualifier: Diagnosis of  By: Craige Cotta MD, Vineet     Gall bladder disease 04/28/2016   Gastritis and gastroduodenitis    GERD (gastroesophageal reflux disease)    Goiter    hx of   Headache(784.0)    migraine   Hyperlipidemia    Hypertension    Hypothyroidism    HYPOTHYROIDISM, POSTSURGICAL 06/04/2008   Qualifier: Diagnosis of  By: Everardo All MD, Sean A    Left ventricular hypertrophy    Leg edema    occasional swelling   Leg swelling 12/09/2018   Myofascial pain syndrome, cervical 01/06/2018   Obesity    OBESITY 11/24/2008   Qualifier: Diagnosis of  By: Vikki Ports     OBSTRUCTIVE SLEEP APNEA 02/16/2007   Auto CPAP 09/03/13 to 10/02/13 >> used on 30 of 30 nights with average 6 hrs and 3 min.  Average AHI is 3.1 with median CPAP 5 cm H2O and 95 th percentile CPAP 6 cm H2O.     Occipital neuralgia of right side 01/06/2018   OSA (obstructive sleep apnea)    cpap setting of 12   Pain in joint of right hip 01/21/2018   Palpitations 01/21/2011   48 hour Holter, 2015, scattered PACs and PVCs.    Paroxysmal atrial fibrillation (HCC)    Pneumonia    PONV (postoperative nausea and vomiting)    severe after every surgery   Pulmonary hypertension, secondary    RHINITIS 07/21/2010        RUQ abdominal pain    S/P left TKA 07/27/2011   Sinus node dysfunction (HCC) 11/08/2015   Sleep apnea    Spinal stenosis of cervical region 11/07/2014   Urinary frequency 12/09/2018   UTI (urinary tract infection) 08/15/2019   per pt report   VENTRICULAR HYPERTROPHY, LEFT 11/24/2008   Qualifier: Diagnosis of  By: Vikki Ports      Past Surgical History:  Procedure Laterality Date   ABDOMINAL HYSTERECTOMY     with appendectomy and colon polypectomy   AMPUTATION TOE Left 12/15/2019   Procedure: AMPUTATION  LEFT GREAT TOE;  Surgeon: Park Liter, DPM;  Location: [facility];  Service: Podiatry;  Laterality: Left;   AMPUTATION  TOE Left 01/22/2022   Procedure: AMPUTATION TOE 4th digit;  Surgeon: Candelaria Stagers, DPM;  Location: [facility];  Service: Podiatry;  Laterality: Left;   AMPUTATION TOE Left 09/04/2022   Procedure: LEFT BIG TOE AMPUTAION;  Surgeon: Vivi Barrack, DPM;  Location: [facility];  Service: Podiatry;  Laterality: Left;   ANTERIOR CERVICAL DECOMP/DISCECTOMY FUSION N/A 11/07/2014   Procedure: Anterior Cervical diskectomy and fusion Cervical four-five, Cervical five-six, Cervical six-seven;  Surgeon: Donalee Citrin, MD;  Location: [facility];  Service: Neurosurgery;  Laterality: N/A;   APPENDECTOMY     ARTERY BIOPSY Right 02/16/2017   Procedure: BIOPSY TEMPORAL ARTERY;  Surgeon: Renford Dills, MD;  Location: [facility];  Service: Vascular;  Laterality: Right;   ARTERY BIOPSY Left 04/16/2020   Procedure: BIOPSY TEMPORAL ARTERY;  Surgeon: Renford Dills, MD;  Location: [facility];  Service: Vascular;  Laterality: Left;  LEFT TEMPLE   BACK SURGERY     CARDIAC CATHETERIZATION     CATARACT EXTRACTION W/ INTRAOCULAR LENS  IMPLANT, BILATERAL Bilateral    CHOLECYSTECTOMY N/A 04/29/2016   Procedure: LAPAROSCOPIC CHOLECYSTECTOMY WITH INTRAOPERATIVE CHOLANGIOGRAM;  Surgeon: Ovidio Kin, MD;  Location: [facility];  Service: General;  Laterality: N/A;   EP IMPLANTABLE DEVICE N/A 11/08/2015   MRI COMPATABLE -- Procedure: Pacemaker Implant;  Surgeon: Duke Salvia, MD;  Location: [facility];  Service: Cardiovascular;  Laterality: N/A;   INCISION AND DRAINAGE ABSCESS Right 05/30/2021   Procedure: IRRIGATION AND DEBRIDEMENT OF PREPATELLA BURSA;  Surgeon: Durene Romans, MD;  Location: [facility];  Service: Orthopedics;  Laterality: Right;  60 wants standard knee draping and pulse lavage   INCISION AND DRAINAGE ABSCESS Right 07/22/2021   Procedure: INCISION AND DRAINAGE ABSCESS RIGHT KNEE;  Surgeon: Durene Romans, MD;  Location: [facility];  Service: Orthopedics;  Laterality: Right;   INSERT / REPLACE / REMOVE PACEMAKER  2017   Dr.  [facility])   IR FLUORO GUIDE CV LINE RIGHT  07/25/2021   IR REMOVAL TUN CV CATH W/O FL  09/04/2021   IR US GUIDE VASC ACCESS RIGHT  07/25/2021   JOINT REPLACEMENT     LEAD EXTRACTION N/A 09/08/2022   Procedure: LEAD EXTRACTION;  Surgeon: Marinus Maw, MD;  [facility]: [facility];  Service: Cardiovascular;  Laterality: N/A;   LEAD EXTRACTION N/A 09/11/2022   Procedure: LEAD EXTRACTION;  Surgeon: Marinus Maw, MD;  [facility]: [facility];  Service: Cardiovascular;  Laterality: N/A;   LUMBAR DISC SURGERY  1990's   x 2   TEE WITHOUT CARDIOVERSION N/A 09/07/2022   Procedure: TRANSESOPHAGEAL ECHOCARDIOGRAM;  Surgeon: Christell Constant, MD;  Location: [facility];  Service: Cardiovascular;  Laterality: N/A;   THYROIDECTOMY  2011   TOTAL KNEE ARTHROPLASTY  07/27/2011   Procedure: TOTAL KNEE ARTHROPLASTY;  Surgeon: Shelda Pal, MD;  Location: [facility];  Service: Orthopedics;  Laterality: Left;   UPPER ESOPHAGEAL ENDOSCOPIC ULTRASOUND (EUS) N/A 05/14/2016   Procedure: UPPER ESOPHAGEAL ENDOSCOPIC ULTRASOUND (EUS);  Surgeon: Rachael Fee, MD;  Location: [facility];  Service: Endoscopy;  Laterality: N/A;  radial only-will be done mod if needed     Home Medications:  Prior to Admission medications   Medication Sig Start Date End Date Taking? Authorizing Provider  acetaminophen (TYLENOL) 500 MG tablet Take 1,000-1,500 mg by mouth every 8 (eight) hours as needed for mild pain, moderate pain or headache.   Yes [provider]  albuterol (PROVENTIL) (2.5 MG/3ML) 0.083% nebulizer solution Take 3 mLs (2.5 mg total) by nebulization every 2 (two) hours as needed for wheezing or shortness of breath. 07/03/22  Yes Parrett, Tammy S, NP  albuterol (VENTOLIN HFA) 108 (90 Base) MCG/ACT inhaler Inhale 2 puffs into the lungs every 6 (six) hours as needed for wheezing or shortness of breath. 07/03/22  Yes Parrett, Tammy S, NP  allopurinol (ZYLOPRIM) 100 MG tablet Take 100 mg by  mouth 2 (two) times daily. 04/16/19  Yes [provider]  amiodarone (PACERONE) 200 MG tablet Take 1 tablet (200 mg total) by mouth daily. 05/22/22  Yes Jake Bathe, MD  amLODipine (NORVASC) 5 MG tablet Take 2 tablets (10  mg total) by mouth daily. 09/15/22  Yes Pokhrel, Laxman, MD  ascorbic acid (VITAMIN C) 500 MG tablet Take 500 mg by mouth daily with lunch.   Yes [provider]  atorvastatin (LIPITOR) 40 MG tablet Take 1 tablet (40 mg total) by mouth daily. Patient taking differently: Take 40 mg by mouth at bedtime. 05/22/22  Yes Jake Bathe, MD  Budeson-Glycopyrrol-Formoterol (BREZTRI AEROSPHERE) 160-9-4.8 MCG/ACT AERO Inhale 2 puffs into the lungs in the morning and at bedtime. 07/03/22  Yes Parrett, Tammy S, NP  Carboxymethylcellulose Sodium (ARTIFICIAL TEARS OP) Place 1 drop into both eyes daily as needed (dry eyes).   Yes [provider]  daptomycin (CUBICIN) IVPB Inject 600 mg into the vein every other day. 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  First Dose: Yes Last Day of Therapy:  10/20/22 Labs - Once weekly:  CBC/D, BMP, and CPK Labs - Every other week:  ESR and CRP Method of administration: IV Push Method of administration may be changed at the discretion of home infusion pharmacist based upon assessment of the patient and/or caregiver's ability to self-administer the medication ordered. 09/15/22 10/26/22 Yes Pokhrel, Laxman, MD  ELIQUIS 2.5 MG TABS tablet TAKE 1 TABLET TWICE DAILY 04/20/22  Yes Skains, Veverly Fells, MD  FARXIGA 10 MG TABS tablet Take 10 mg by mouth daily.   Yes [provider]  ferrous sulfate 325 (65 FE) MG tablet Take 1 tablet (325 mg total) by mouth daily with breakfast. 01/26/20  Yes Duke Salvia, MD  fluticasone [facility]) 50 MCG/ACT nasal spray USE 2 SPRAYS NASALLY DAILY Patient taking differently: Place 1 spray into both nostrils daily as needed for allergies. USE 2 SPRAYS NASALLY DAILY 02/26/17  Yes Byrum, Les Pou, MD   gabapentin (NEURONTIN) 100 MG capsule Take 2 capsules (200 mg total) by mouth 3 (three) times daily. 09/15/22  Yes Pokhrel, Laxman, MD  guaiFENesin (ROBITUSSIN) 100 MG/5ML liquid Take 5 mLs by mouth every 4 (four) hours as needed for cough or to loosen phlegm.   Yes [provider]  HYDROcodone-acetaminophen (NORCO) 10-325 MG tablet Take 1 tablet by mouth every 6 (six) hours as needed. 09/15/22  Yes Pokhrel, Laxman, MD  HYDROXYZINE HCL PO Take 25 mg by mouth at bedtime as needed (insomnia).   Yes [provider]  insulin aspart (NOVOLOG) 100 UNIT/ML FlexPen Inject 0-20 Units into the skin See admin instructions. Inject 0-20 units into the skin three times a day before meals "as needed," per SLIDING SCALE   Yes [provider]  insulin glargine (LANTUS) 100 UNIT/ML injection Inject 0.55 mLs (55 Units total) into the skin 2 (two) times daily. Patient taking differently: Inject 25-50 Units into the skin See admin instructions. Inject 25 units subcutaneously daily in the morning. Inject 50 units subcutaneously daily at bedtime.  Patient report 90 units in the morning 25 units at bedtime, was adamant regarding dosage. 07/27/21  Yes Regalado, Belkys A, MD  levothyroxine (SYNTHROID) 200 MCG tablet Take 200 mcg by mouth daily before breakfast.   Yes [provider]  loratadine (CLARITIN) 10 MG tablet Take 1 tablet (10 mg total) by mouth daily. 09/16/22  Yes Pokhrel, Laxman, MD  losartan (COZAAR) 25 MG tablet Take 25 mg by mouth daily. 03/07/22  Yes [provider]  melatonin 5 MG TABS Take 1 tablet (5 mg total) by mouth at bedtime as needed. 09/15/22  Yes Pokhrel, Laxman, MD  polyethylene glycol (MIRALAX / GLYCOLAX) 17 g packet Take 17 g  by mouth daily as needed for mild constipation or moderate constipation. 09/15/22  Yes Pokhrel, Laxman, MD  sennosides-docusate sodium (SENOKOT-S) 8.6-50 MG tablet Take 1 tablet by mouth daily.   Yes [provider]  torsemide  (DEMADEX) 20 MG tablet Take 2 tablets (40 mg total) by mouth daily. 09/15/22  Yes Pokhrel, Laxman, MD  vitamin B-12 (CYANOCOBALAMIN) 1000 MCG tablet Take 1,000 mcg by mouth daily with lunch.   Yes [provider]  Vitamin D3 (VITAMIN D) 25 MCG tablet Take 1,000 Units by mouth daily with lunch.   Yes [provider]  Alcohol Swabs (B-D SINGLE USE SWABS REGULAR) PADS  11/24/19   [provider]  Blood Glucose Calibration (TRUE METRIX LEVEL 1) Low SOLN  11/27/19   [provider]  nitroGLYCERIN (NITROSTAT) 0.4 MG SL tablet Place 1 tablet (0.4 mg total) under the tongue every 5 (five) minutes as needed for chest pain. 04/13/19   Jake Bathe, MD  PRESCRIPTION MEDICATION CPAP- At bedtime and as needed during any time of rest    [provider]  TRUE METRIX BLOOD GLUCOSE TEST test strip  09/18/17   [provider]  TRUEplus Lancets 28G MISC  11/24/19   [provider]    Inpatient Medications: Scheduled Meds:  Chlorhexidine Gluconate Cloth  6 each Topical Daily   Continuous Infusions:  PRN Meds: sodium chloride flush  Allergies:    Allergies  Allergen Reactions   Dulaglutide Nausea Only, Other (See Comments) and Cough    TRULICITY made the patient sick to her stomach (only) several days after using it   Oxycodone Itching and Other (See Comments)    Pt still takes this med even thought it causes itching   Trelegy Ellipta [Fluticasone-Umeclidin-Vilant] Nausea And Vomiting, Other (See Comments) and Cough    Made the patient sick to her stomach (only) several days after using it     Social History:   Social History   Socioeconomic History   Marital status: Married    Spouse name: Agigail Nordine   Number of children: 3   Years of education: 12   Highest education level: Not on file  Occupational History   Occupation: retired  Tobacco Use   Smoking status: Former    Packs/day: 2.00    Years: 33.00    Additional pack years:  0.00    Total pack years: 66.00    Types: Cigarettes    Start date: 1966    Quit date: 05/04/1997    Years since quitting: 25.4   Smokeless tobacco: Never  Vaping Use   Vaping Use: Never used  Substance and Sexual Activity   Alcohol use: No   Drug use: No   Sexual activity: Not on file  Other Topics Concern   Not on file  Social History Narrative   Patient lives at home with her husband Fayrene Fearing .   Retired.   Education 12 th grade.   Right handed   Caffeine two cups of coffee.   Social Determinants of Health   Financial Resource Strain: Low Risk  (01/31/2021)   Overall Financial Resource Strain (CARDIA)    Difficulty of Paying Living Expenses: Not very hard  Food Insecurity: No Food Insecurity (01/20/2022)   Hunger Vital Sign    Worried About Running Out of Food in the Last Year: Never true    Ran Out of Food in the Last Year: Never true  Transportation Needs: No Transportation Needs (01/20/2022)   PRAPARE - Transportation  Lack of Transportation (Medical): No    Lack of Transportation (Non-Medical): No  Physical Activity: Insufficiently Active (09/30/2021)   Exercise Vital Sign    Days of Exercise per Week: 5 days    Minutes of Exercise per Session: 20 min  Stress: No Stress Concern Present (09/30/2021)   [employer] of Occupational Health - Occupational Stress Questionnaire    Feeling of Stress : Not at all  Social Connections: Socially Integrated (09/30/2021)   Social Connection and Isolation Panel [NHANES]    Frequency of Communication with Friends and Family: More than three times a week    Frequency of Social Gatherings with Friends and Family: Twice a week    Attends Religious Services: 1 to 4 times per year    Active Member of [employer] or Organizations: Yes    Attends Banker Meetings: 1 to 4 times per year    Marital Status: Married  Catering manager Violence: Not At Risk (01/20/2022)   Humiliation, Afraid, Rape, and Kick questionnaire    Fear of  Current or Ex-Partner: No    Emotionally Abused: No    Physically Abused: No    Sexually Abused: No    Family History:    Family History  Problem Relation Age of Onset   Heart Problems Mother    Heart defect Mother        valve problems   Heart Problems Father    Heart disease Brother    Lung cancer Daughter        non small cell    Diabetes Maternal Aunt    Diabetes Maternal Uncle    Throat cancer Brother    Colon cancer Neg Hx    Colon polyps Neg Hx    Esophageal cancer Neg Hx    Rectal cancer Neg Hx    Stomach cancer Neg Hx      ROS:  Please see the history of present illness.   All other ROS reviewed and negative.     Physical Exam/Data:   Vitals:   09/30/22 2200 10/01/22 0015 10/01/22 0100 10/01/22 0118  BP: (!) 160/54 118/76 135/71   Pulse: (!) 48 (!) 50 (!) 41   Resp: 18 16 18    Temp:    98.1 F (36.7 C)  TempSrc:    Oral  SpO2: 97% 97% 98%   Weight:      Height:       No intake or output data in the 24 hours ending 10/01/22 0121    09/30/2022    4:43 PM 09/13/2022    5:00 AM 09/12/2022    6:12 AM  Last 3 Weights  Weight (lbs) 234 lb 2.1 oz 234 lb 2.1 oz 235 lb 10.8 oz  Weight (kg) 106.2 kg 106.2 kg 106.9 kg     Body mass index is 38.96 kg/m.  General:  Well nourished, well developed, in no acute distress HEENT: normal Neck: no JVD Vascular: No carotid bruits; Distal pulses 2+ bilaterally Cardiac:  normal S1, S2; bradycardic and slightly irregular; no murmur  Lungs:  clear to auscultation bilaterally, no wheezing, rhonchi or rales  Abd: soft, nontender, no hepatomegaly  Ext: 3+ bilateral lower extremity edema Musculoskeletal:  No deformities, BUE and BLE strength normal and equal Skin: warm and dry  Neuro:  CNs 2-12 intact, no focal abnormalities noted Psych:  Normal affect   EKG:  The EKG was personally reviewed and demonstrates: Unclear underlying rhythm with rate in the 40s either sinus bradycardia versus junctional  rhythm, there does  appear to be intermittent P waves but no consistent p-p interval discerned given poor quality. Telemetry:  Telemetry was personally reviewed and demonstrates: Sinus bradycardia with PACs  Relevant CV Studies: TTE 09/06/2022 IMPRESSIONS     1. There is a highly mobile mass visualized on the right atrial lead  mesauring 2.5cmx1.8cm concerning for device lead vegetation (clip 40).  Recommend TEE for further evaluation if clinically indicated.   2. Left ventricular ejection fraction, by estimation, is 60 to 65%. The  left ventricle has normal function. The left ventricle has no regional  wall motion abnormalities. Left ventricular diastolic parameters were  normal.   3. Right ventricular systolic function is normal. The right ventricular  size is mildly enlarged. There is normal pulmonary artery systolic  pressure.   4. Left atrial size was mildly dilated.   5. The mitral valve is grossly normal. Trivial mitral valve  regurgitation.   6. The aortic valve is tricuspid. There is moderate calcification of the  aortic valve. There is moderate thickening of the aortic valve. Aortic  valve regurgitation is trivial. Mild aortic valve stenosis. Aortic valve  mean gradient measures 13.5 mmHg.  Aortic valve Vmax measures 2.46 m/s.   Laboratory Data:  High Sensitivity Troponin:  No results for input(s): "TROPONINIHS" in the last 720 hours.   Chemistry Recent Labs  Lab 10/01/22 0011  NA 140  K 4.3  CL 103  CO2 27  GLUCOSE 167*  BUN 45*  CREATININE 2.28*  CALCIUM 8.6*  MG 1.6*  GFRNONAA 21*  ANIONGAP 10    No results for input(s): "PROT", "ALBUMIN", "AST", "ALT", "ALKPHOS", "BILITOT" in the last 168 hours. Lipids No results for input(s): "CHOL", "TRIG", "HDL", "LABVLDL", "LDLCALC", "CHOLHDL" in the last 168 hours.  Hematology Recent Labs  Lab 10/01/22 0011  WBC 12.5*  RBC 3.01*  HGB 8.6*  HCT 28.6*  MCV 95.0  MCH 28.6  MCHC 30.1  RDW 15.3  PLT 511*   Thyroid No results for  input(s): "TSH", "FREET4" in the last 168 hours.  BNPNo results for input(s): "BNP", "PROBNP" in the last 168 hours.  DDimer No results for input(s): "DDIMER" in the last 168 hours.   Radiology/Studies:  No results found.  Assessment and Plan:   Symptomatic bradycardia with history of sinus node dysfunction and paroxysmal A-fib Prior PPM complicated by lead infection secondary to MRSA bacteremia Patient has had a complicated course to date and likely has a component of deconditioning; however, she is very deliberate in the fact that she wants to get functionally better and that her lightheadedness on exertion with physical therapy is significantly limiting.  Review of her EKG and telemetry does demonstrate some level of persistent sinus node dysfunction despite being off nodal agents given her sinus bradycardia versus junctional rhythm; however, without exertional evaluation of her chronotropic response, hard to say if this is what is truly causing her lightheadedness.  She was atrial dependent prior to her extraction.  Complicating the fact is that she remains on IV antibiotics for complicated MRSA bacteremia and she cannot have a permanent device placed until her course is finished.  Plan is to admit to medicine for her chronic comorbidities and evaluation of chronotropic response with physical therapy while on telemetry.  Would hold amiodarone for now but she may need this long-term.  If she truly does not have chronotropic response with exertion and remains symptomatic, this can be very limiting for her physical therapy and conditioning; however, we  have limited options given her ongoing IV antibiotics.  Could consider temp perm as a bridge vs. Micra vs. Continued observed physical therapy.  Reassuringly she is hemodynamically stable and has not had true syncope while at her rehab, so at the very least she has demonstrated preserved cardiac output and could be closely monitored during her therapy  sessions as she tries to be cleaned some level of conditioning.  -Please repeat EKG to try to get better quality EKG to help determine underlying rhythm -Continue telemetry -Physical therapy evaluation with heart rate response monitoring -Continue to hold nodal agents -Hold amiodarone for now; however, she does have a history of sick sinus syndrome and tachycardia while in A-fib and may need this in the long-term -Continue Eliquis -We will continue to follow   Risk Assessment/Risk Scores:          CHA2DS2-VASc Score = 7   This indicates a 11.2% annual risk of stroke. The patient's score is based upon: CHF History: 1 HTN History: 1 Diabetes History: 1 Stroke History: 0 Vascular Disease History: 1 Age Score: 2 Gender Score: 1         For questions or updates, please contact  [facility] Please consult [email or web address] for contact info under    Signed, Thana Ates, MD  10/01/2022 1:21 AM

## 2022-10-01 NOTE — TOC Initial Note (Addendum)
Transition of Care [facility]) - Initial/Assessment Note    Patient Details  Name: Kayla [name] MRN: [id number] Date of Birth: 02-02-1941  Transition of Care [facility]) CM/SW Contact:    Gala Lewandowsky, RN Phone Number: 10/01/2022, 3:52 PM  Clinical Narrative: Risk for readmission assessment completed. Patient presented for vascular access problems. Patient was previously at [facility] SNF and was d/c home. Patient states she is currently on IV Daptomycin; however, was not set up with an infusion company. Patient reports that she has 2 doses of Daptomycin in the home. [employer] arranged for [facility] RN/PT with [facility]. Case Manager spoke with the unit pharmacists and she spoke with ID pharmacist. Case Manager spoke with [facility] Liaison Pam and she has followed the patient in the past and has educated the husband in the past. Julianne Rice is willing to follow the patient. Pam will visit the patient this afternoon for education and to see if she can get the medication ordered. OPAT has been signed. Unsure if patient will be able to transition home tonight. Patient will need resumption orders and face to face before she leaves the hospital. Case Manager will continue to follow for transition of care needs as the patient progresses.                       1637 10-01-22 Case Manager spoke with MD and the plan will be for patient to transition home this evening. [facility] will see the patient and order Daptomycin for the medications to be delivered on 10-02-22. Patient is currently active with [facility] and the agency will call the patient with visit times.     Expected Discharge Plan: Home w Home Health Services Barriers to Discharge: No Barriers Identified   Patient Goals and CMS Choice Patient states their goals for this hospitalization and ongoing recovery are:: patient wants to return home   Choice offered to / list presented to : NA      Expected Discharge Plan and  Services In-house Referral: NA Discharge Planning Services: CM Consult Post Acute Care Choice: Home Health, Resumption of Svcs/PTA Provider Living arrangements for the past 2 months: Single Family Home       HH Arranged: RN, Disease Management, PT, IV Antibiotics HH Agency: [facility] Date [facility] Agency Contacted: 10/01/22 Time HH Agency Contacted: [phone] Representative spoke with at [facility] Agency: Christian  Prior Living Arrangements/Services Living arrangements for the past 2 months: Single Family Home Lives with:: Spouse Patient language and need for interpreter reviewed:: Yes Do you feel safe going back to the place where you live?: Yes      Need for Family Participation in Patient Care: Yes (Comment) Care giver support system in place?: Yes (comment) Current home services: DME Criminal Activity/Legal Involvement Pertinent to Current Situation/Hospitalization: No - Comment as needed  Activities of Daily Living Home Assistive Devices/Equipment: None, CPAP ADL Screening (condition at time of admission) Patient's cognitive ability adequate to safely complete daily activities?: Yes Is the patient deaf or have difficulty hearing?: No Does the patient have difficulty seeing, even when wearing glasses/contacts?: No Does the patient have difficulty concentrating, remembering, or making decisions?: No Patient able to express need for assistance with ADLs?: Yes Does the patient have difficulty dressing or bathing?: Yes Independently performs ADLs?: Yes (appropriate for developmental age) Does the patient have difficulty walking or climbing stairs?: Yes Weakness of Legs: None Weakness of Arms/Hands: None  Permission Sought/Granted Permission sought to share information with :  Family Supports, Case Production designer, theatre/television/film, Oceanographer granted to share information with : Yes, Verbal Permission Granted     Permission granted to share info w AGENCY: [facility]         Emotional Assessment Appearance:: Appears stated age Attitude/Demeanor/Rapport: Engaged Affect (typically observed): Appropriate Orientation: : Oriented to Self, Oriented to Place, Oriented to  Time, Oriented to Situation Alcohol / Substance Use: Not Applicable Psych Involvement: No (comment)  Admission diagnosis:  Bradycardia [R00.1] Patient Active Problem List   Diagnosis Date Noted   Bradycardia 10/01/2022   MRSA bacteremia 10/01/2022   Acute kidney injury superimposed on chronic kidney disease (HCC) 10/01/2022   Complication of central venous catheter 10/01/2022   Osteomyelitis (HCC) 10/01/2022   Closed nondisplaced fracture of proximal phalanx of right great toe 09/06/2022   Pyogenic inflammation of bone (HCC) 09/05/2022   Acute hematogenous osteomyelitis of left foot (HCC) 09/04/2022   Diabetic foot infection (HCC) 09/03/2022   Chronic respiratory failure with hypoxia (HCC) 02/20/2022   Pleuritic pain 02/20/2022   Toe osteomyelitis, left (HCC) 01/20/2022   Carotid bruit 07/07/2021   Septic prepatellar bursitis of right knee 05/30/2021   Cellulitis of right lower extremity 03/19/2021   Chronic kidney disease, stage 3b (HCC) 03/19/2021   Polymyalgia rheumatica (HCC) 04/15/2020   Temporal arteritis (HCC) 04/15/2020   Pacemaker - MDT 01/24/2020   Orthostatic lightheadedness 01/24/2020   Chronic osteomyelitis involving left ankle and foot (HCC)    Paroxysmal atrial fibrillation (HCC) 10/25/2019   Secondary hypercoagulable state (HCC) 10/25/2019   Numbness and tingling of right leg 10/19/2019   Lumbar radiculopathy 10/19/2019   Chronic diastolic heart failure (HCC) 08/13/2019   Chest pain 08/13/2019   Leukocytosis 08/13/2019   Leg swelling 12/09/2018   Urinary frequency 12/09/2018   Pain in joint of right hip 01/21/2018   Myofascial pain syndrome, cervical 01/06/2018   Occipital neuralgia of right side 01/06/2018   RUQ abdominal pain    Gastritis and gastroduodenitis     Bile duct abnormality    Cholecystitis 04/30/2016   Gall bladder disease 04/28/2016   Sinus node dysfunction (HCC) 11/08/2015   Ejection fraction    Spinal stenosis of cervical region 11/07/2014   Preoperative clearance 10/09/2014   Carotid artery disease (HCC)    S/P left TKA 07/27/2011   Dizzy spells 05/04/2011   Palpitations 01/21/2011   RHINITIS 07/21/2010   Hyperlipidemia associated with type 2 diabetes mellitus (HCC) 11/24/2008   OBESITY 11/24/2008   Coronary atherosclerosis 11/24/2008   VENTRICULAR HYPERTROPHY, LEFT 11/24/2008   HYPOTHYROIDISM, POSTSURGICAL 06/04/2008   Headache 06/04/2008   Insulin dependent type 2 diabetes mellitus (HCC) 03/23/2007   Esophageal reflux 03/23/2007   DIVERTICULOSIS OF COLON 03/23/2007   GENERALIZED OSTEOARTHROSIS UNSPECIFIED SITE 03/23/2007   OSA (obstructive sleep apnea) 02/16/2007   Hypertension associated with diabetes (HCC) 02/16/2007   COPD (chronic obstructive pulmonary disease) (HCC) 02/16/2007   PCP:  Ailene Ravel, MD Pharmacy:   [facility] [phone] [place], [place] - [address] AT [facility] [address] [place] [address] Phone: [phone] Fax: [phone]  [facility] - [place], [place] - [address] [place] [address] Phone: [phone] Fax: [phone]  Social Determinants of Health (SDOH) Social History: SDOH Screenings   Food Insecurity: No Food Insecurity (10/01/2022)  Housing: Patient Declined (10/01/2022)  Transportation Needs: No Transportation Needs (10/01/2022)  Utilities: Not At Risk (10/01/2022)  Alcohol Screen: Low Risk  (01/31/2021)  Depression (PHQ2-9):  Low Risk  (09/30/2021)  Recent Concern: Depression (PHQ2-9) - Medium Risk (08/18/2021)  Financial Resource Strain: Low Risk  (01/31/2021)  Physical Activity: Insufficiently Active (09/30/2021)  Social Connections: Socially Integrated (09/30/2021)  Stress: No Stress Concern  Present (09/30/2021)  Tobacco Use: Medium Risk (09/30/2022)   SDOH Interventions:     Readmission Risk Interventions    10/01/2022    3:52 PM 01/22/2022    2:43 PM 07/23/2021   12:10 PM  Readmission Risk Prevention Plan  Transportation Screening Complete Complete Complete  PCP or Specialist Appt within 3-5 Days Complete Complete   HRI or Home Care Consult  Complete   Social Work Consult for Recovery Care Planning/Counseling Complete Complete   Palliative Care Screening Not Applicable Not Applicable   Medication Review Oceanographer) Referral to Pharmacy Complete Complete  HRI or Home Care Consult   Complete  SW Recovery Care/Counseling Consult   Complete  Palliative Care Screening   Not Applicable  Skilled Nursing Facility   Complete

## 2022-10-01 NOTE — Progress Notes (Signed)
PHARMACY CONSULT NOTE FOR:  OUTPATIENT  PARENTERAL ANTIBIOTIC THERAPY (OPAT)  Indication: MRSA bacteremia 2/2 CIED infection  Regimen: Daptomycin 600 mg IV every 48 hours  End date: 10/20/22  IV antibiotic discharge orders are pended. To discharging provider:  please sign these orders via discharge navigator,  Select New Orders & click on the button choice - Manage This Unsigned Work.     Thank you for allowing pharmacy to be a part of this patient's care.  Sharin Mons, PharmD, BCPS, BCIDP Infectious Diseases Clinical Pharmacist Phone: [phone] 10/01/2022, 3:41 PM

## 2022-10-01 NOTE — Care Management Obs Status (Signed)
MEDICARE OBSERVATION STATUS NOTIFICATION   Patient Details  Name: Kayla [name] MRN: [id number] Date of Birth: 1940-10-02   Medicare Observation Status Notification Given:  Yes    Gala Lewandowsky, RN 10/01/2022, 2:57 PM

## 2022-10-01 NOTE — Care Management CC44 (Signed)
Condition Code 44 Documentation Completed  Patient Details  Name: Kayla [name] MRN: [id number] Date of Birth: 12/11/1940   Condition Code 44 given:  Yes Patient signature on Condition Code 44 notice:  Yes Documentation of 2 MD's agreement:  Yes Code 44 added to claim:  Yes    Gala Lewandowsky, RN 10/01/2022, 2:57 PM

## 2022-10-01 NOTE — Consult Note (Addendum)
ELECTROPHYSIOLOGY CONSULT NOTE    Patient ID: ILYANNA BIBER MRN: [id number], DOB/AGE: Oct 04, 1940 82 y.o.  Admit date: 09/30/2022 Date of Consult: 10/01/2022  Primary Physician: Ailene Ravel, MD Primary Cardiologist: Donato Schultz, MD  Electrophysiologist: Dr. Ladona Ridgel   Referring Provider: Dr. Arlean Hopping   Patient Profile: VICKKI PEERENBOOM is a 82 y.o. female with a history of obesity, DM II, HTN, HLD, CAD, CHF, PAF on Eliquis, SSS s/p PPM, CKD, Anemia, COPD, GERD, hypothyroidism, chronic venous insufficiency, s/p left first ray amputation (5/3) with active osteomyelitis who is being seen today for the evaluation of bradycardia at the request of Dr. Arlean Hopping.  HPI:  DEICI PATIENT is a 82 y.o. female who presented to [facility] ER on 5/29 from SNF with clotted PICC line.   She was recently admitted from 5/2-5/14 for CEID in the setting of MRSA bacteremia, osteomyelitis of her left foot. Given MRSA bacteremia, TEE was completed which showed thickening on the RV lead in the RA. Her PPM was extracted on 5/7 & temp perm placed. Her beta blockers were held. She had no further evidence of chrontropic incompetence/SSS at that time that warranted re-implantation. She had a ZIO patch mailed to her that was mailed back on 5/28. Beta blockers were held but she was on amiodarone.  She was discharged to SNF for rehab with plan for 6 weeks of IV daptomycin (started 09/08/22).  She did not make it to her wound check following device extraction.   She was referred to the ER on 5/29 for clotted PICC line. At that time, she was noted to have sinus bradycardia 40-50's. After questioning, she had complaints of dizziness/lightheadedness with PT efforts while at the SNF. She was hemodynamically stable. Cardiology was consulted for evaluation.  She was admitted per TRH.  EKG showed junctional bradycardia.  The patient states "I am going to be weak after all I have been through and it Arrie Zuercher take time".    She denies  chest pain, palpitations, dyspnea, PND, orthopnea, nausea, vomiting, syncope, edema, weight gain, or early satiety.   Labs Potassium4.3 (05/30 0011) Magnesium  1.6* (05/30 0011) Creatinine, ser  2.28* (05/30 0011) PLT  511* (05/30 0011) HGB  8.6* (05/30 0011) WBC 12.5* (05/30 0011)  .     Medications Prior to Admission  Medication Sig Dispense Refill Last Dose   acetaminophen (TYLENOL) 500 MG tablet Take 1,000-1,500 mg by mouth every 8 (eight) hours as needed for mild pain, moderate pain or headache.   09/29/2022   albuterol (PROVENTIL) (2.5 MG/3ML) 0.083% nebulizer solution Take 3 mLs (2.5 mg total) by nebulization every 2 (two) hours as needed for wheezing or shortness of breath. 75 mL 5 09/29/2022   albuterol (VENTOLIN HFA) 108 (90 Base) MCG/ACT inhaler Inhale 2 puffs into the lungs every 6 (six) hours as needed for wheezing or shortness of breath. 3 each 3 09/29/2022   allopurinol (ZYLOPRIM) 100 MG tablet Take 100 mg by mouth 2 (two) times daily.   09/30/2022   amiodarone (PACERONE) 200 MG tablet Take 1 tablet (200 mg total) by mouth daily. 90 tablet 3 09/30/2022   amLODipine (NORVASC) 5 MG tablet Take 2 tablets (10 mg total) by mouth daily.   09/30/2022   ascorbic acid (VITAMIN C) 500 MG tablet Take 500 mg by mouth daily with lunch.   09/30/2022   atorvastatin (LIPITOR) 40 MG tablet Take 1 tablet (40 mg total) by mouth daily. (Patient taking differently: Take 40 mg by mouth at bedtime.)  90 tablet 3 09/29/2022   Budeson-Glycopyrrol-Formoterol (BREZTRI AEROSPHERE) 160-9-4.8 MCG/ACT AERO Inhale 2 puffs into the lungs in the morning and at bedtime. 3 each 10 09/30/2022   Carboxymethylcellulose Sodium (ARTIFICIAL TEARS OP) Place 1 drop into both eyes daily as needed (dry eyes).   unknown   daptomycin (CUBICIN) IVPB Inject 600 mg into the vein every other day. 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  First Dose: Yes Last Day of Therapy:  10/20/22 Labs - Once weekly:  CBC/D, BMP, and  CPK Labs - Every other week:  ESR and CRP Method of administration: IV Push Method of administration may be changed at the discretion of home infusion pharmacist based upon assessment of the patient and/or caregiver's ability to self-administer the medication ordered. 20 Units 0 unknown   ELIQUIS 2.5 MG TABS tablet TAKE 1 TABLET TWICE DAILY 180 tablet 3 09/30/2022   FARXIGA 10 MG TABS tablet Take 10 mg by mouth daily.   09/30/2022   ferrous sulfate 325 (65 FE) MG tablet Take 1 tablet (325 mg total) by mouth daily with breakfast. 90 tablet 3 09/30/2022   fluticasone (FLONASE) 50 MCG/ACT nasal spray USE 2 SPRAYS NASALLY DAILY (Patient taking differently: Place 1 spray into both nostrils daily as needed for allergies. USE 2 SPRAYS NASALLY DAILY) 48 g 1 09/30/2022   gabapentin (NEURONTIN) 100 MG capsule Take 2 capsules (200 mg total) by mouth 3 (three) times daily.   09/30/2022   guaiFENesin (ROBITUSSIN) 100 MG/5ML liquid Take 5 mLs by mouth every 4 (four) hours as needed for cough or to loosen phlegm.   09/29/2022   HYDROcodone-acetaminophen (NORCO) 10-325 MG tablet Take 1 tablet by mouth every 6 (six) hours as needed. 10 tablet 0 unknown   HYDROXYZINE HCL PO Take 25 mg by mouth at bedtime as needed (insomnia).   09/29/2022   insulin aspart (NOVOLOG) 100 UNIT/ML FlexPen Inject 0-20 Units into the skin See admin instructions. Inject 0-20 units into the skin three times a day before meals "as needed," per SLIDING SCALE   09/30/2022   insulin glargine (LANTUS) 100 UNIT/ML injection Inject 0.55 mLs (55 Units total) into the skin 2 (two) times daily. (Patient taking differently: Inject 25-50 Units into the skin See admin instructions. Inject 25 units subcutaneously daily in the morning. Inject 50 units subcutaneously daily at bedtime.  Patient report 90 units in the morning 25 units at bedtime, was adamant regarding dosage.) 10 mL 11 09/30/2022   levothyroxine (SYNTHROID) 200 MCG tablet Take 200 mcg by mouth daily  before breakfast.   09/30/2022   loratadine (CLARITIN) 10 MG tablet Take 1 tablet (10 mg total) by mouth daily.   09/30/2022   losartan (COZAAR) 25 MG tablet Take 25 mg by mouth daily.   09/30/2022   melatonin 5 MG TABS Take 1 tablet (5 mg total) by mouth at bedtime as needed.  0 09/30/2022   polyethylene glycol (MIRALAX / GLYCOLAX) 17 g packet Take 17 g by mouth daily as needed for mild constipation or moderate constipation.  0 09/30/2022   sennosides-docusate sodium (SENOKOT-S) 8.6-50 MG tablet Take 1 tablet by mouth daily.   09/30/2022   torsemide (DEMADEX) 20 MG tablet Take 2 tablets (40 mg total) by mouth daily.   09/30/2022   vitamin B-12 (CYANOCOBALAMIN) 1000 MCG tablet Take 1,000 mcg by mouth daily with lunch.   09/30/2022   Vitamin D3 (VITAMIN D) 25 MCG tablet Take 1,000 Units by mouth daily with lunch.   09/30/2022  Alcohol Swabs (B-D SINGLE USE SWABS REGULAR) PADS       Blood Glucose Calibration (TRUE METRIX LEVEL 1) Low SOLN       nitroGLYCERIN (NITROSTAT) 0.4 MG SL tablet Place 1 tablet (0.4 mg total) under the tongue every 5 (five) minutes as needed for chest pain. 25 tablet 4    PRESCRIPTION MEDICATION CPAP- At bedtime and as needed during any time of rest      TRUE METRIX BLOOD GLUCOSE TEST test strip       TRUEplus Lancets 28G MISC        Inpatient Medications:   Chlorhexidine Gluconate Cloth  6 each Topical Daily    Allergies:  Allergies  Allergen Reactions   Dulaglutide Nausea Only, Other (See Comments) and Cough    TRULICITY made the patient sick to her stomach (only) several days after using it   Oxycodone Itching and Other (See Comments)    Pt still takes this med even thought it causes itching   Trelegy Ellipta [Fluticasone-Umeclidin-Vilant] Nausea And Vomiting, Other (See Comments) and Cough    Made the patient sick to her stomach (only) several days after using it     Family History  Problem Relation Age of Onset   Heart Problems Mother    Heart defect Mother         valve problems   Heart Problems Father    Heart disease Brother    Lung cancer Daughter        non small cell    Diabetes Maternal Aunt    Diabetes Maternal Uncle    Throat cancer Brother    Colon cancer Neg Hx    Colon polyps Neg Hx    Esophageal cancer Neg Hx    Rectal cancer Neg Hx    Stomach cancer Neg Hx      Physical Exam: Vitals:   10/01/22 0118 10/01/22 0200 10/01/22 0442 10/01/22 0741  BP:  121/82 (!) 141/58 (!) 160/84  Pulse:  (!) 55 (!) 55 (!) 55  Resp:  (!) 22 17 19   Temp: 98.1 F (36.7 C)  98 F (36.7 C) 97.7 F (36.5 C)  TempSrc: Oral  Oral Oral  SpO2:  96% 99% 97%  Weight:   100.4 kg   Height:        GEN- NAD, A&O x 3, normal affect / pleasant. Appears improved overall since last admit. HEENT: Normocephalic, atraumatic Lungs- CTAB, Normal effort.  Heart- Regular rate and rhythm, junctional bradycardia on monitor, No M/G/R.  GI- Soft, NT, ND.  Extremities- No clubbing, cyanosis, or edema   Radiology/Studies:  No new images this admission   [id number] bradycardia, rate 41/regular (personally reviewed)  TELEMETRY: Stable junctional rhythm 40-60 overnight, with frequent conducted PAC's. No rate <40 identified on tele (personally reviewed)  DEVICE HISTORY:  CEID s/p Extraction on 5/7. No device currently with active MRSA bacteremia.   Assessment/Plan:  Bradycardia / Stable Junctional Rhythm  Prior Medtronic PPM s/p removal 5/7 in the setting of CEID. Tele & EKG's reviewed, stable junctional rhythm.  -ok to resume amiodarone  -plan for EP follow up as previously arranged  -no need to remain in hospital from EP standpoint.  Would like to see her finish her abx regimen prior to considering re-implantation of new device. She has a stable junctional rhythm currently.  -hold all AV nodal blocking agents  -PT evaluation pending   Osteomyelitis with MRSA Bacteremia  -per primary  -pt indicates sutures to be removed per Podiatry  [facility] Demarco Bacci sign off.   Medication Recommendations:  ok to resume amio as above  Other recommendations (labs, testing, etc):   Follow up as an outpatient:  follow up as scheduled on 6/5 with EP    For questions or updates, please contact [facility] Please consult [email or web address] for contact info under Cardiology/STEMI.  Signed, Canary Brim, MSN, APRN, NP-C, AGACNP-BC [facility] - Electrophysiology  10/01/2022, 9:10 AM   I have seen and examined this patient with Canary Brim.  Agree with above, note added to reflect my findings.  Patient with a past medical history as above.  She has had a recent pacemaker extraction due to osteomyelitis and bacteremia.  She presented to the emergency room with a clotted PICC line.  She was noted to be bradycardic.  She had complained of some lightheadedness/dizziness while at her nursing facility.  Currently she feels well lying in bed.  GEN: Well nourished, well developed, in no acute distress  HEENT: normal  Neck: no JVD, carotid bruits, or masses Cardiac: RRR; no murmurs, rubs, or gallops,no edema  Respiratory:  clear to auscultation bilaterally, normal work of breathing GI: soft, nontender, nondistended, + BS MS: no deformity or atrophy  Skin: warm and dry, device site well healed Neuro:  Strength and sensation are intact Psych: euthymic mood, full affect   Sick sinus syndrome/junctional rhythm: Patient is post Medtronic pacemaker extraction.  She has a PICC line in place and is getting IV antibiotics.  She has a narrow complex junctional rhythm.  This is likely a stable escape rhythm.  At this point, as she has ongoing antibiotics and a PICC line in place, no plans for pacemaker implant.  Charnise Lovan M. Davelle Anselmi MD 10/01/2022 7:20 PM

## 2022-10-02 ENCOUNTER — Ambulatory Visit: Payer: Medicare HMO | Admitting: Podiatry

## 2022-10-02 ENCOUNTER — Ambulatory Visit (INDEPENDENT_AMBULATORY_CARE_PROVIDER_SITE_OTHER): Payer: Medicare HMO

## 2022-10-02 DIAGNOSIS — M79671 Pain in right foot: Secondary | ICD-10-CM

## 2022-10-02 DIAGNOSIS — Z89422 Acquired absence of other left toe(s): Secondary | ICD-10-CM

## 2022-10-02 DIAGNOSIS — R7881 Bacteremia: Secondary | ICD-10-CM | POA: Diagnosis not present

## 2022-10-02 NOTE — Discharge Summary (Signed)
Physician Discharge Summary   Patient: Kayla [name]: [id number] DOB: 10/04/1940  Admit date:     09/30/2022  Discharge date: 10/01/2022  Discharge Physician: Clydie Braun   PCP: Ailene Ravel, MD   Recommendations at discharge:   Keep previous outpatient follow-up appointments  Discharge Diagnoses: Principal Problem:   Bradycardia Active Problems:   Leukocytosis   MRSA bacteremia   Acute kidney injury superimposed on chronic kidney disease (HCC)   Complication of central venous catheter   Insulin dependent type 2 diabetes mellitus (HCC)   Paroxysmal atrial fibrillation (HCC)   Toe osteomyelitis, left (HCC)   HYPOTHYROIDISM, POSTSURGICAL   Hyperlipidemia associated with type 2 diabetes mellitus (HCC)   OBESITY   OSA (obstructive sleep apnea)   COPD (chronic obstructive pulmonary disease) (HCC)   Chronic diastolic heart failure (HCC)   Pacemaker - MDT  Resolved Problems:   * No resolved hospital problems. *  Hospital Course: Kayla [name] is a 82 y.o. female with medical history significant of hypertension, hyperlipidemia, CAD, CHF, paroxysmal atrial fibrillation on Eliquis, SSS s/p PM, carotid artery disease, COPD, diabetes mellitus type 2 with polyneuropathy, anemia of chronic disease, hypothyroidism, OSA who presents with complaints of clogged PICC line.  She just recently been hospitalized from 5/2-5/14 for left amputation site infection.  MRI of the foot showed acute osteomyelitis patient had been started on broad-spectrum antibiotics and underwent left partial ray amputation on 5/3.  Blood cultures were positive for MRSA and there was concern for likely pacemaker lead infection which was extracted on 5/7.  ID had been consulted and recommended 6 weeks of IV daptomycin which PICC line was placed.  [facility] had [facility] and thereafter was discharged to home.  She states that she had been doing well until last night when PICC line became  occluded. Patient complained of feeling dizzy and lightheaded with therapies, but states currently that she is ready to go back home.  Patient makes note that she had missed her medications last night and initial medications this morning due to being here in the hospital.  She states that her swelling in her legs is better and she knows that her kidney function has not been doing well for which she is scheduled to follow-up with Dr. Thedore Mins of [facility] next week.   In the emergency department patient was noted to have heart rates in the 30-50s with blood pressures maintained.  Axis was unclogged while in the emergency department.  Labs significant for WBC 12.5, hemoglobin 8.6, platelets 511, potassium 4.3, BUN 45->48, creatinine 2.28-> 2.36, and glucose elevated to 280.  EKG showed a junctional rhythm at 41 bpm.  He had been consulted to evaluate.    Assessment and Plan: Bradycardia/junctional rhythm Patient reported feeling lightheaded and dizzy.  Initial reports were that heart rates were in the mid 30s, but since being on the floor had not been noted to have heart rates less than 40s on telemetry.  Cardiology and subsequently EP had evaluated and recommended continuing amiodarone and avoid AV nodal blocking agents.  Cardiology deemed patient safe from their standpoint for discharge.  Patient did not have physical therapy evaluation as she did not want to go to skilled nursing facility.  Transitions of care was consulted and arranged for home health for physical therapy and continuation of IV antibiotics   Complication of central venous access Resolved.  PICC was noted to be clogged at home, but was addressed  while in the ED and functioning properly at this time.   Leukocytosis Similar to prior.  WBC 12.5.  Patient was noted to be afebrile. -Continue to monitor   MRSA bacteremia Vegetation on pacemaker lead Prior to arrival.  Initial blood cultures positive for MRSA.  Patient had  TEE on 5/6 that showed vegetation on RV lead which was extracted on 5/7.  ID recommended PICC line placement and continue IV antibiotics for 6 weeks from 5/7. -Continue daptomycin -Continue outpatient follow-up with ID   Acute kidney injury on chronic kidney disease stage IV Creatinine noted to be elevated up to 2.36 with BUN 48.  Baseline creatinine prior to discharge from hospital was 1.94.  Possibly related to patient missing her medications including diuretics.  Patient followed by Dr. Thedore Mins of nephrology in the outpatient setting. -Continue outpatient follow-up with [facility]   Osteomyelitis left great toe Patient s/p left partial first ray amputation by Dr. Loreta Ave on 5/3.  She is scheduled scheduled to have follow-up in the outpatient setting tomorrow. -Continue local wound care -Continue outpatient follow-up with podiatry   Chronic respiratory failure with hypoxia COPD, without acute exacerbation Patient on 3 L of oxygen at baseline.  No wheezing noted on physical exam. -Continue nasal cannula oxygen maintain O2 saturation greater than 90% -Continue home inhaler, and albuterol nebs as needed   Paroxysmal atrial fibrillation Sick sinus syndrome s/p PM Since recent lead extraction patient had been discontinued off of metoprolol due to bradycardia.  EP recommended holding all AV nodal blocking agents. -Continue amiodarone and Eliquis   Diabetes mellitus type 2 with diabetic neuropathy Hemoglobin A1c was 6.8. -Hypoglycemic protocols -CBGs before every meal with resistant SSI -Plan to resume home regimen at discharge   Hypothyroidism -Continue Synthroid     Anemia of chronic disease Hemoglobin 8.6  which appears around patient's baseline -Continue to monitor   Thrombocytosis Similar to prior.  Platelet count 498        Consultants: Cardiology, EP cardiology, and podiatry Procedures performed: None Disposition: Home Diet recommendation:  Discharge  Diet Orders (From admission, onward)     Start     Ordered   10/01/22 0000  Diet - low sodium heart healthy        10/01/22 1627            DISCHARGE MEDICATION: Allergies as of 10/01/2022       Reactions   Dulaglutide Nausea Only, Other (See Comments), Cough   TRULICITY made the patient sick to her stomach (only) several days after using it   Oxycodone Itching, Other (See Comments)   Pt still takes this med even thought it causes itching   Trelegy Ellipta [fluticasone-umeclidin-vilant] Nausea And Vomiting, Other (See Comments), Cough   Made the patient sick to her stomach (only) several days after using it         Medication List     STOP taking these medications    Farxiga 10 MG Tabs tablet Generic drug: dapagliflozin propanediol       TAKE these medications    acetaminophen 500 MG tablet Commonly known as: TYLENOL Take 1,000-1,500 mg by mouth every 8 (eight) hours as needed for mild pain, moderate pain or headache.   albuterol 108 (90 Base) MCG/ACT inhaler Commonly known as: VENTOLIN HFA Inhale 2 puffs into the lungs every 6 (six) hours as needed for wheezing or shortness of breath.   albuterol (2.5 MG/3ML) 0.083% nebulizer solution Commonly known as: PROVENTIL Take 3 mLs (2.5  mg total) by nebulization every 2 (two) hours as needed for wheezing or shortness of breath.   allopurinol 100 MG tablet Commonly known as: ZYLOPRIM Take 100 mg by mouth 2 (two) times daily.   amiodarone 200 MG tablet Commonly known as: PACERONE Take 1 tablet (200 mg total) by mouth daily.   amLODipine 5 MG tablet Commonly known as: NORVASC Take 2 tablets (10 mg total) by mouth daily.   ARTIFICIAL TEARS OP Place 1 drop into both eyes daily as needed (dry eyes).   ascorbic acid 500 MG tablet Commonly known as: VITAMIN C Take 500 mg by mouth daily with lunch.   atorvastatin 40 MG tablet Commonly known as: LIPITOR Take 1 tablet (40 mg total) by mouth daily. What changed:  when to take this   B-D SINGLE USE SWABS REGULAR Pads   Breztri Aerosphere 160-9-4.8 MCG/ACT Aero Generic drug: Budeson-Glycopyrrol-Formoterol Inhale 2 puffs into the lungs in the morning and at bedtime.   cyanocobalamin 1000 MCG tablet Commonly known as: VITAMIN B12 Take 1,000 mcg by mouth daily with lunch.   daptomycin  IVPB Commonly known as: CUBICIN Inject 600 mg into the vein every other day for 18 days. 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  First Dose: Yes Last Day of Therapy:  10/20/22 Labs - Once weekly:  CBC/D, BMP, and CPK Labs - Every other week:  ESR and CRP Method of administration: IV Push Method of administration may be changed at the discretion of home infusion pharmacist based upon assessment of the patient and/or caregiver's ability to self-administer the medication ordered.   Eliquis 2.5 MG Tabs tablet Generic drug: apixaban TAKE 1 TABLET TWICE DAILY   ferrous sulfate 325 (65 FE) MG tablet Take 1 tablet (325 mg total) by mouth daily with breakfast.   fluticasone 50 MCG/ACT nasal spray Commonly known as: FLONASE USE 2 SPRAYS NASALLY DAILY What changed:  how much to take how to take this when to take this reasons to take this   gabapentin 100 MG capsule Commonly known as: NEURONTIN Take 2 capsules (200 mg total) by mouth 3 (three) times daily.   guaiFENesin 100 MG/5ML liquid Commonly known as: ROBITUSSIN Take 5 mLs by mouth every 4 (four) hours as needed for cough or to loosen phlegm.   HYDROcodone-acetaminophen 10-325 MG tablet Commonly known as: NORCO Take 1 tablet by mouth every 6 (six) hours as needed.   HYDROXYZINE HCL PO Take 25 mg by mouth at bedtime as needed (insomnia).   insulin aspart 100 UNIT/ML FlexPen Commonly known as: NOVOLOG Inject 0-20 Units into the skin See admin instructions. Inject 0-20 units into the skin three times a day before meals "as needed," per SLIDING SCALE   insulin glargine 100 UNIT/ML  injection Commonly known as: LANTUS Inject 0.55 mLs (55 Units total) into the skin 2 (two) times daily. What changed:  how much to take when to take this additional instructions   levothyroxine 200 MCG tablet Commonly known as: SYNTHROID Take 200 mcg by mouth daily before breakfast.   loratadine 10 MG tablet Commonly known as: CLARITIN Take 1 tablet (10 mg total) by mouth daily.   losartan 25 MG tablet Commonly known as: COZAAR Take 25 mg by mouth daily.   melatonin 5 MG Tabs Take 1 tablet (5 mg total) by mouth at bedtime as needed.   nitroGLYCERIN 0.4 MG SL tablet Commonly known as: NITROSTAT Place 1 tablet (0.4 mg total) under the tongue every 5 (five) minutes as needed for  chest pain.   polyethylene glycol 17 g packet Commonly known as: MIRALAX / GLYCOLAX Take 17 g by mouth daily as needed for mild constipation or moderate constipation.   PRESCRIPTION MEDICATION CPAP- At bedtime and as needed during any time of rest   sennosides-docusate sodium 8.6-50 MG tablet Commonly known as: SENOKOT-S Take 1 tablet by mouth daily.   torsemide 20 MG tablet Commonly known as: DEMADEX Take 2 tablets (40 mg total) by mouth daily.   True Metrix Blood Glucose Test test strip Generic drug: glucose blood   True Metrix Level 1 Low Soln   TRUEplus Lancets 28G Misc   vitamin D3 25 MCG tablet Commonly known as: CHOLECALCIFEROL Take 1,000 Units by mouth daily with lunch.               Discharge Care Instructions  (From admission, onward)           Start     Ordered   10/01/22 0000  Discharge wound care:       Comments: Daily wound care to left foot:  Remove dressing and cleanse incision with NS. Pat dry. Paint with povidone-iodine swabstick and allow to air dry. When dry, cover with dry dressing, ABD pad and secure with a few turns of Kerlix roll gauze/paper tape.   10/01/22 1627            Follow-up Information     [facility]  Follow up.   Specialty: Home Health Services Why: Registered Nurse and Physical Therapy-office to call with visit times. Contact information: [address] [place] [address] [phone]                Discharge Exam: Ceasar Mons Weights   09/30/22 1643 10/01/22 0442  Weight: 106.2 kg 100.4 kg   Patient alert and oriented x 3 Bradycardic with no significant lower extremity edema Ray Amputation of left foot.   Condition at discharge: stable  The results of significant diagnostics from this hospitalization (including imaging, microbiology, ancillary and laboratory) are listed below for reference.   Imaging Studies: EP PPM/ICD IMPLANT  Result Date: 09/11/2022 Conclusion: Successful explantation of a [employer] temporary permanent transvenous pacemaker in a patient with prior severe sinus node dysfunction pacemaker such that an indwelling pacemaker is not presently required. Lewayne Bunting, MD   DG CHEST PORT 1 VIEW  Result Date: 09/11/2022 CLINICAL DATA:  Shortness of breath EXAM: PORTABLE CHEST 1 VIEW COMPARISON:  09/10/2022 FINDINGS: Cardiomegaly with left chest single lead pacer. Pulmonary vascular prominence and mild diffuse interstitial pulmonary opacity. Probable layering pleural effusions; lung bases partially excluded on AP portable radiograph. The visualized skeletal structures are unremarkable. IMPRESSION: 1. Cardiomegaly with left chest single lead pacer. 2. Pulmonary vascular prominence and mild diffuse interstitial pulmonary opacity, likely edema. 3. Probable layering pleural effusions; lung bases partially excluded on AP portable radiograph. Electronically Signed   By: Jearld Lesch M.D.   On: 09/11/2022 08:46   DG Chest Port 1 View  Result Date: 09/10/2022 CLINICAL DATA:  [id number] Dyspnea [id number] EXAM: PORTABLE CHEST 1 VIEW COMPARISON:  Radiograph 02/20/2022 FINDINGS: Unchanged cardiomediastinal silhouette. There is a single lead pacemaker in place. Mild  interstitial opacities. There is bibasilar airspace disease and small pleural effusions, left greater than right. No evidence of pneumothorax. Thoracic spondylosis. Bilateral shoulder degenerative changes. Partially visualized cervical spine fusion or repaired lower neck surgical clips, unchanged. IMPRESSION: Mild interstitial edema with small pleural effusions and bibasilar airspace disease, left greater  than right, likely edema and atelectasis. Electronically Signed   By: Caprice Renshaw M.D.   On: 09/10/2022 08:40   [place] EKG SITE RITE  Result Date: 09/09/2022 If Site Rite image not attached, placement could not be confirmed due to current cardiac rhythm.  EP PPM/ICD IMPLANT  Result Date: 09/08/2022 Conclusion: Successful extraction of a dual-chamber pacing system in a patient with marked sinus node dysfunction and insertion of a temporary permanent transvenous pacing lead in a patient with MRSA bacteremia and MRSA pacemaker lead endocarditis. Lewayne Bunting, MD   ECHO TEE  Result Date: 09/07/2022    TRANSESOPHOGEAL ECHO REPORT   Patient Name:   BALJINDER RADEN Date of Exam: 09/07/2022 Medical Rec #:  [id number]        Height:       65.0 in Accession #:    [id number]       Weight:       221.6 lb Date of Birth:  16-Apr-1941       BSA:          2.066 m Patient Age:    81 years         BP:           142/61 mmHg Patient Gender: F                HR:           60 bpm. Exam Location:  Inpatient Procedure: Transesophageal Echo, 3D Echo, Cardiac Doppler and Color Doppler Indications:    Bacteremia  History:        Patient has prior history of Echocardiogram examinations, most                 recent 09/06/2022. CHF, Pacemaker, Pulmonary HTN, COPD and Carotid                 Disease, Arrythmias:Atrial Fibrillation, Signs/Symptoms:Dyspnea;                 Risk Factors:Sleep Apnea, Obesity, Hypertension, Dyslipidemia                 and Diabetes. CKD.  Sonographer:    Milda Smart Referring Phys: [id number] MICHAEL ANDREW TILLERY  PROCEDURE: After discussion of the risks and benefits of a TEE, an informed consent was obtained from the patient. TEE procedure time was 7 minutes. The transesophogeal probe was passed without difficulty through the esophogus of the patient. Imaged were  obtained with the patient in a left lateral decubitus position. Sedation performed by different physician. The patient was monitored while under deep sedation. Anesthestetic sedation was provided intravenously by Anesthesiology: 95.23mg  of Propofol, 60mg  of Lidocaine. Image quality was good. The patient's vital signs; including heart rate, blood pressure, and oxygen saturation; remained stable throughout the procedure. The patient developed no complications during the procedure.  IMPRESSIONS  1. There is a 1.64 cm X 0.78 cm X 0.4 cm vegetation on the right ventricular CIED lead at the level of the right atrium. Right ventricular systolic function is normal. The right ventricular size is mildly enlarged.  2. Left ventricular ejection fraction, by estimation, is 60 to 65%. The left ventricle has normal function.  3. Left atrial size was mildly dilated. No left atrial/left atrial appendage thrombus was detected.  4. Right atrial size was mildly dilated.  5. The mitral valve is degenerative. No evidence of mitral valve regurgitation. No evidence of mitral stenosis. Moderate mitral annular calcification.  6. Tricuspid valve regurgitation is moderate.  7. The aortic valve is tricuspid. Aortic valve regurgitation is trivial. Aortic valve sclerosis is present, with no evidence of aortic valve stenosis.  8. There is Moderate (Grade III) plaque involving the descending aorta. FINDINGS  Left Ventricle: Left ventricular ejection fraction, by estimation, is 60 to 65%. The left ventricle has normal function. The left ventricular internal cavity size was normal in size. Right Ventricle: There is a 1.64 cm X 0.78 cm X 0.4 cm vegetation on the right ventricular CIED lead at the  level of the right atrium. The right ventricular size is mildly enlarged. Right vetricular wall thickness was not well visualized. Right ventricular systolic function is normal. Left Atrium: Left atrial size was mildly dilated. No left atrial/left atrial appendage thrombus was detected. Right Atrium: Right atrial size was mildly dilated. Pericardium: Trivial pericardial effusion is present. The pericardial effusion is circumferential. Mitral Valve: The mitral valve is degenerative in appearance. Moderate mitral annular calcification. No evidence of mitral valve regurgitation. No evidence of mitral valve stenosis. Tricuspid Valve: The tricuspid valve is grossly normal. Tricuspid valve regurgitation is moderate . No evidence of tricuspid stenosis. Aortic Valve: The aortic valve is tricuspid. Aortic valve regurgitation is trivial. Aortic valve sclerosis is present, with no evidence of aortic valve stenosis. Pulmonic Valve: The pulmonic valve was normal in structure. Pulmonic valve regurgitation is not visualized. Aorta: The aortic root is normal in size and structure. There is moderate (Grade III) plaque involving the descending aorta. IAS/Shunts: No atrial level shunt detected by color flow Doppler. Additional Comments: Spectral Doppler performed. Riley Lam MD Electronically signed by Riley Lam MD Signature Date/Time: 09/07/2022/4:46:43 PM    Final    EP STUDY  Result Date: 09/07/2022 See surgical note for result.  ECHOCARDIOGRAM COMPLETE  Result Date: 09/06/2022    ECHOCARDIOGRAM REPORT   Patient Name:   KIAYLA ANDREA Date of Exam: 09/06/2022 Medical Rec #:  [id number]        Height:       65.0 in Accession #:    [id number]       Weight:       221.6 lb Date of Birth:  1940/11/13       BSA:          2.066 m Patient Age:    81 years         BP:           136/57 mmHg Patient Gender: F                HR:           62 bpm. Exam Location:  Inpatient Procedure: 2D Echo, Cardiac Doppler and Color  Doppler Indications:    Endocarditis I38  History:        Patient has prior history of Echocardiogram examinations, most                 recent 08/13/2019. LVH, CAD, Pulmonary HTN and Carotid Disease;                 Risk Factors:Diabetes, Former Smoker, Dyslipidemia, Sleep Apnea                 and Hypertension.  Sonographer:    Aron Baba Referring Phys: [id number] [facility]  Sonographer Comments: Image acquisition challenging due to patient body habitus, Image acquisition challenging due to respiratory motion and Image acquisition challenging due to COPD. IMPRESSIONS  1. There is a highly mobile mass visualized on the right atrial  lead mesauring 2.5cmx1.8cm concerning for device lead vegetation (clip 40). Recommend TEE for further evaluation if clinically indicated.  2. Left ventricular ejection fraction, by estimation, is 60 to 65%. The left ventricle has normal function. The left ventricle has no regional wall motion abnormalities. Left ventricular diastolic parameters were normal.  3. Right ventricular systolic function is normal. The right ventricular size is mildly enlarged. There is normal pulmonary artery systolic pressure.  4. Left atrial size was mildly dilated.  5. The mitral valve is grossly normal. Trivial mitral valve regurgitation.  6. The aortic valve is tricuspid. There is moderate calcification of the aortic valve. There is moderate thickening of the aortic valve. Aortic valve regurgitation is trivial. Mild aortic valve stenosis. Aortic valve mean gradient measures 13.5 mmHg. Aortic valve Vmax measures 2.46 m/s. Comparison(s): Compared to prior TTE on 02/2022, there is now concern for device lead vegetation as detailed above. FINDINGS  Left Ventricle: Left ventricular ejection fraction, by estimation, is 60 to 65%. The left ventricle has normal function. The left ventricle has no regional wall motion abnormalities. The left ventricular internal cavity size was normal in size. There is  no left  ventricular hypertrophy. Left ventricular diastolic parameters were normal. Right Ventricle: There is a highly mobile mass visualized on the right atrial lead mesauring 2.5cmx1.8cm concerning for device lead vegetation (clip 40). Recommend TEE for further evaluation if clinically indicated. The right ventricular size is mildly enlarged. No increase in right ventricular wall thickness. Right ventricular systolic function is normal. There is normal pulmonary artery systolic pressure. The tricuspid regurgitant velocity is 2.21 m/s, and with an assumed right atrial pressure of 15 mmHg, the estimated right ventricular systolic pressure is 34.5 mmHg. Left Atrium: Left atrial size was mildly dilated. Right Atrium: Right atrial size was normal in size. Pericardium: There is no evidence of pericardial effusion. Mitral Valve: The mitral valve is grossly normal. Trivial mitral valve regurgitation. Tricuspid Valve: The tricuspid valve is grossly normal. Tricuspid valve regurgitation is mild. Aortic Valve: The aortic valve is tricuspid. There is moderate calcification of the aortic valve. There is moderate thickening of the aortic valve. Aortic valve regurgitation is trivial. Mild aortic stenosis is present. Aortic valve mean gradient measures 13.5 mmHg. Aortic valve peak gradient measures 24.1 mmHg. Aortic valve area, by VTI measures 2.43 cm. Pulmonic Valve: The pulmonic valve was grossly normal. Pulmonic valve regurgitation is trivial. Aorta: The aortic root is normal in size and structure. IAS/Shunts: The atrial septum is grossly normal.  LEFT VENTRICLE PLAX 2D LVIDd:         4.20 cm   Diastology LVIDs:         2.90 cm   LV e' medial:    6.05 cm/s LV PW:         1.80 cm   LV E/e' medial:  23.5 LV IVS:        1.10 cm   LV e' lateral:   8.24 cm/s LVOT diam:     2.10 cm   LV E/e' lateral: 17.2 LV SV:         146 LV SV Index:   71 LVOT Area:     3.46 cm  RIGHT VENTRICLE RV S prime:     12.40 cm/s TAPSE (M-mode): 1.3 cm LEFT  ATRIUM             Index        RIGHT ATRIUM           Index LA diam:  3.20 cm 1.55 cm/m   RA Area:     21.50 cm LA Vol (A2C):   70.3 ml 34.03 ml/m  RA Volume:   63.00 ml  30.49 ml/m LA Vol (A4C):   55.2 ml 26.72 ml/m LA Biplane Vol: 62.4 ml 30.20 ml/m  AORTIC VALVE AV Area (Vmax):    2.02 cm AV Area (Vmean):   2.18 cm AV Area (VTI):     2.43 cm AV Vmax:           245.50 cm/s AV Vmean:          174.500 cm/s AV VTI:            0.601 m AV Peak Grad:      24.1 mmHg AV Mean Grad:      13.5 mmHg LVOT Vmax:         143.00 cm/s LVOT Vmean:        110.000 cm/s LVOT VTI:          0.421 m LVOT/AV VTI ratio: 0.70  AORTA Ao Root diam: 3.30 cm Ao Asc diam:  3.30 cm MITRAL VALVE                TRICUSPID VALVE MV Area (PHT): 2.99 cm     TR Peak grad:   19.5 mmHg MV Decel Time: 254 msec     TR Vmax:        221.00 cm/s MV E velocity: 142.00 cm/s MV A velocity: 102.00 cm/s  SHUNTS MV E/A ratio:  1.39         Systemic VTI:  0.42 m                             Systemic Diam: 2.10 cm Laurance Flatten MD Electronically signed by Laurance Flatten MD Signature Date/Time: 09/06/2022/8:27:33 PM    Final    DG Foot Complete Right  Result Date: 09/05/2022 CLINICAL DATA:  Right foot pain. Bruising around the second and third MCP joints. Great toe bruising under the toenail. EXAM: RIGHT FOOT COMPLETE - 3+ VIEW COMPARISON:  Foot radiographs 08/20/2022 FINDINGS: Acute mildly displaced fracture of the medial base of the distal phalanx of the great toe. Adjacent soft tissue swelling. Demineralization. Calcaneal spurs. Vascular calcifications. IMPRESSION: Acute mildly displaced fracture of the medial base of the distal phalanx of the great toe. Electronically Signed   By: Minerva Fester M.D.   On: 09/05/2022 20:18   VAS [place] LOWER EXTREMITY VENOUS (DVT) (7a-7p)  Result Date: 09/05/2022  Lower Venous DVT Study Patient Name:  CHEYANE SHERWIN  Date of Exam:   09/03/2022 Medical Rec #: [id number]         Accession #:    [id number] Date  of Birth: [birth year]        Patient Gender: F Patient Age:   81 years Exam Location:  [facility] Procedure:      VAS [place] LOWER EXTREMITY VENOUS (DVT) Referring Phys: Maryanna Shape --------------------------------------------------------------------------------  Indications: Palpable varicosities, erythema in setting of concern for toe infection.  Anticoagulation: Eliquis. Limitations: Poor ultrasound/tissue interface and patient pain at calf. Comparison Study: No prior studies. Performing Technologist: Jean Rosenthal RDMS, RVT  Examination Guidelines: A complete evaluation includes B-mode imaging, spectral Doppler, color Doppler, and power Doppler as needed of all accessible portions of each vessel. Bilateral testing is considered an integral part of a complete examination. Limited examinations for reoccurring indications may be performed as noted. The  reflux portion of the exam is performed with the patient in reverse Trendelenburg.  +-----+---------------+---------+-----------+----------+--------------+ RIGHTCompressibilityPhasicitySpontaneityPropertiesThrombus Aging +-----+---------------+---------+-----------+----------+--------------+ CFV  Full           Yes      Yes                                 +-----+---------------+---------+-----------+----------+--------------+   +---------+---------------+---------+-----------+----------+-------------------+ LEFT     CompressibilityPhasicitySpontaneityPropertiesThrombus Aging      +---------+---------------+---------+-----------+----------+-------------------+ CFV      Full           Yes      Yes                                      +---------+---------------+---------+-----------+----------+-------------------+ SFJ      Full                                                             +---------+---------------+---------+-----------+----------+-------------------+ FV Prox  Full                                                              +---------+---------------+---------+-----------+----------+-------------------+ FV Mid   Full                                                             +---------+---------------+---------+-----------+----------+-------------------+ FV DistalFull                                                             +---------+---------------+---------+-----------+----------+-------------------+ PFV      Full                                                             +---------+---------------+---------+-----------+----------+-------------------+ POP      Full           Yes      Yes                                      +---------+---------------+---------+-----------+----------+-------------------+ PTV      Full                                                             +---------+---------------+---------+-----------+----------+-------------------+  PERO                                                  Not well visualized +---------+---------------+---------+-----------+----------+-------------------+ GSV      Full                                                             +---------+---------------+---------+-----------+----------+-------------------+     Summary: RIGHT: - No evidence of common femoral vein obstruction.  LEFT: - There is no evidence of deep vein thrombosis in the lower extremity. However, portions of this examination were limited- see technologist comments above.  - No cystic structure found in the popliteal fossa.  *See table(s) above for measurements and observations. Electronically signed by Coral Else MD on 09/05/2022 at 2:58:30 PM.    Final    DG Foot 2 Views Left  Result Date: 09/04/2022 CLINICAL DATA:  Osteomyelitis EXAM: LEFT FOOT - 2 VIEW COMPARISON:  Left foot x-ray 09/03/2022 FINDINGS: There has been interval amputation of the first metatarsal head and first toe. There is soft tissue swelling and air over the surgical margin  compatible with recent surgery. No acute fracture or dislocation identified. Again seen are changes of prior fourth toe amputation. Healed second proximal phalanx fracture again noted. Linear foreign body is seen in the soft tissues of the fifth toe inferiorly measuring 7 mm in length, unchanged. Peripheral vascular calcifications are present. IMPRESSION: 1. Interval amputation of the first metatarsal head and first toe. 2. Linear foreign body in the soft tissues of the fifth toe, unchanged. Electronically Signed   By: Darliss Cheney M.D.   On: 09/04/2022 19:37   MRI Left foot without contrast  Result Date: 09/04/2022 CLINICAL DATA:  Osteonecrosis suspected, foot, xray done EXAM: MRI OF THE LEFT FOOT WITHOUT CONTRAST TECHNIQUE: Multiplanar, multisequence MR imaging of the left forefoot was performed. No intravenous contrast was administered. COMPARISON:  Left foot radiograph 09/03/2022, 01/22/2022 FINDINGS: Bones/Joint/Cartilage Prior fourth toe and great toe distal phalanx amputations. There is a comminuted fracture of the residual great toe proximal phalanx with significant displacement and associated marrow edema. There are areas of low T1 marrow signal. There is a chronic fracture of the base of the second toe proximal phalanx. No other significant marrow signal alteration. Ligaments Intact Lisfranc ligament. Muscles and Tendons Chronic denervation changes. Postsurgical changes to the first and fourth toe tendons. Soft tissues There is a soft tissue ulcer along the medial aspect of the great toe with sinus tract extending to the surface of bone. There is a communicating fluid collection just distally measuring up to 1.3 x 1.3 x 1.2 cm over the sees axial T2 image 5, coronal T2 image 4). There is an adjacent 0.9 x 0.7 x 1.1 cm low signal intensity lesion of unclear significance, could reflect an area of developing focal soft tissue calcification. There is focal susceptibility artifact in the soft tissues  adjacent to the base of the fifth toe and the fourth metatarsal head, corresponding to a metallic foreign body as seen on recent radiograph. IMPRESSION: Prior great toe distal phalanx amputation. Soft tissue ulcer along the medial aspect of the great toe with sinus  tract extending to the surface of the residual proximal phalanx, with adjacent communicating fluid collection measuring up to 1.3 x 1.3 x 1.2 cm concerning for soft tissue abscess. Underlying comminuted fracture of the residual great toe proximal phalanx with associated marrow signal abnormality. Given the adjacent sinus tract and fluid collection, this is concerning for superimposed osteomyelitis. Chronic fracture of the base of the second toe proximal phalanx. Focal susceptibility artifact in the soft tissues adjacent to the base of the fifth toe and the fourth metatarsal head, corresponding to a metallic foreign body as seen on recent radiograph. Prior fourth toe amputation. Electronically Signed   By: Caprice Renshaw M.D.   On: 09/04/2022 16:03   DG Foot Complete Left  Result Date: 09/03/2022 Please see detailed radiograph report in office note.   Microbiology: Results for orders placed or performed during the hospital encounter of 09/03/22  Blood culture (routine x 2)     Status: Abnormal   Collection Time: 09/03/22  5:47 PM   Specimen: BLOOD LEFT FOREARM  Result Value Ref Range Status   Specimen Description BLOOD LEFT FOREARM  Final   Special Requests   Final    BOTTLES DRAWN AEROBIC AND ANAEROBIC Blood Culture results may not be optimal due to an inadequate volume of blood received in culture bottles   Culture  Setup Time   Final    GRAM POSITIVE COCCI IN CLUSTERS AEROBIC BOTTLE ONLY CRITICAL RESULT CALLED TO, READ BACK BY AND VERIFIED WITH: MD A. KANTETI ON 09/04/22 @ 1730 BY DRT    Culture (A)  Final    METHICILLIN RESISTANT STAPHYLOCOCCUS AUREUS Sent to [facility] for further susceptibility testing. SEE SEPARATE REPORT Performed at  [facility], [address], [place], [place] [address]    Report Status 09/11/2022 FINAL  Final   Organism ID, Bacteria METHICILLIN RESISTANT STAPHYLOCOCCUS AUREUS  Final      Susceptibility   Methicillin resistant staphylococcus aureus - MIC*    CIPROFLOXACIN >=8 RESISTANT Resistant     ERYTHROMYCIN >=8 RESISTANT Resistant     GENTAMICIN <=0.5 SENSITIVE Sensitive     OXACILLIN >=4 RESISTANT Resistant     TETRACYCLINE >=16 RESISTANT Resistant     VANCOMYCIN <=0.5 SENSITIVE Sensitive     TRIMETH/SULFA >=320 RESISTANT Resistant     CLINDAMYCIN <=0.25 SENSITIVE Sensitive     RIFAMPIN <=0.5 SENSITIVE Sensitive     Inducible Clindamycin NEGATIVE Sensitive     LINEZOLID 2 SENSITIVE Sensitive     * METHICILLIN RESISTANT STAPHYLOCOCCUS AUREUS  Blood Culture ID Panel (Reflexed)     Status: Abnormal   Collection Time: 09/03/22  5:47 PM  Result Value Ref Range Status   Enterococcus faecalis NOT DETECTED NOT DETECTED Final   Enterococcus Faecium NOT DETECTED NOT DETECTED Final   Listeria monocytogenes NOT DETECTED NOT DETECTED Final   Staphylococcus species DETECTED (A) NOT DETECTED Final    Comment: CRITICAL RESULT CALLED TO, READ BACK BY AND VERIFIED WITH: MD A. KANTETI ON 09/04/22 @ 1730 BY DRT    Staphylococcus aureus (BCID) DETECTED (A) NOT DETECTED Final    Comment: Methicillin (oxacillin)-resistant Staphylococcus aureus (MRSA). MRSA is predictably resistant to beta-lactam antibiotics (except ceftaroline). Preferred therapy is vancomycin unless clinically contraindicated. Patient requires contact precautions if  hospitalized. CRITICAL RESULT CALLED TO, READ BACK BY AND VERIFIED WITH: MD A. KANTETI ON 09/04/22 @ 1730 BY DRT    Staphylococcus epidermidis NOT DETECTED NOT DETECTED Final   Staphylococcus lugdunensis NOT DETECTED NOT DETECTED Final  Streptococcus species NOT DETECTED NOT DETECTED Final   Streptococcus agalactiae NOT DETECTED NOT DETECTED Final   Streptococcus  pneumoniae NOT DETECTED NOT DETECTED Final   Streptococcus pyogenes NOT DETECTED NOT DETECTED Final   A.calcoaceticus-baumannii NOT DETECTED NOT DETECTED Final   Bacteroides fragilis NOT DETECTED NOT DETECTED Final   Enterobacterales NOT DETECTED NOT DETECTED Final   Enterobacter cloacae complex NOT DETECTED NOT DETECTED Final   Escherichia coli NOT DETECTED NOT DETECTED Final   Klebsiella aerogenes NOT DETECTED NOT DETECTED Final   Klebsiella oxytoca NOT DETECTED NOT DETECTED Final   Klebsiella pneumoniae NOT DETECTED NOT DETECTED Final   Proteus species NOT DETECTED NOT DETECTED Final   Salmonella species NOT DETECTED NOT DETECTED Final   Serratia marcescens NOT DETECTED NOT DETECTED Final   Haemophilus influenzae NOT DETECTED NOT DETECTED Final   Neisseria meningitidis NOT DETECTED NOT DETECTED Final   Pseudomonas aeruginosa NOT DETECTED NOT DETECTED Final   Stenotrophomonas maltophilia NOT DETECTED NOT DETECTED Final   Candida albicans NOT DETECTED NOT DETECTED Final   Candida auris NOT DETECTED NOT DETECTED Final   Candida glabrata NOT DETECTED NOT DETECTED Final   Candida krusei NOT DETECTED NOT DETECTED Final   Candida parapsilosis NOT DETECTED NOT DETECTED Final   Candida tropicalis NOT DETECTED NOT DETECTED Final   Cryptococcus neoformans/gattii NOT DETECTED NOT DETECTED Final   Meth resistant mecA/C and MREJ DETECTED (A) NOT DETECTED Final    Comment: CRITICAL RESULT CALLED TO, READ BACK BY AND VERIFIED WITH: MD A. KANTETI ON 09/04/22 @ 1730 BY DRT Performed at [facility], [address], [place], [place] [address]   Blood culture (routine x 2)     Status: Abnormal   Collection Time: 09/03/22 10:45 PM   Specimen: BLOOD  Result Value Ref Range Status   Specimen Description BLOOD RIGHT ANTECUBITAL  Final   Special Requests   Final    BOTTLES DRAWN AEROBIC AND ANAEROBIC Blood Culture results may not be optimal due to an excessive volume of blood received in culture  bottles   Culture  Setup Time   Final    GRAM POSITIVE COCCI IN CLUSTERS IN BOTH AEROBIC AND ANAEROBIC BOTTLES CRITICAL VALUE NOTED.  VALUE IS CONSISTENT WITH PREVIOUSLY REPORTED AND CALLED VALUE.    Culture (A)  Final    STAPHYLOCOCCUS AUREUS SUSCEPTIBILITIES PERFORMED ON PREVIOUS CULTURE WITHIN THE LAST 5 DAYS. Performed at [facility], [address], [place], [place] [address]    Report Status 09/06/2022 FINAL  Final  Surgical pcr screen     Status: Abnormal   Collection Time: 09/04/22  1:39 AM   Specimen: Nasal Mucosa; Nasal Swab  Result Value Ref Range Status   MRSA, PCR POSITIVE (A) NEGATIVE Final    Comment: RESULT CALLED TO, READ BACK BY AND VERIFIED WITH: A CRUZ RN 09/04/22 @ 0333 BY AB    Staphylococcus aureus POSITIVE (A) NEGATIVE Final    Comment: (NOTE) The Xpert SA Assay (FDA approved for NASAL specimens in patients 48 years of age and older), is one component of a comprehensive surveillance program. It is not intended to diagnose infection nor to guide or monitor treatment. Performed at [facility], [address], [place], [place] [address]   Aerobic Culture w Gram Stain (superficial specimen)     Status: None   Collection Time: 09/04/22  5:51 PM   Specimen: Soft Tissue, Other  Result Value Ref Range Status   Specimen Description WOUND LEFT FOOT  Final   Special Requests PT ON VANC  Final   Gram Stain NO WBC SEEN NO ORGANISMS SEEN   Final   Culture   Final    NO GROWTH Performed at [facility], [address] [place], [place] [address]    Report Status 09/07/2022 FINAL  Final  Culture, blood (Routine X 2) w Reflex to ID Panel     Status: None   Collection Time: 09/06/22 11:00 AM   Specimen: BLOOD  Result Value Ref Range Status   Specimen Description BLOOD SITE NOT SPECIFIED  Final   Special Requests   Final    BOTTLES DRAWN AEROBIC ONLY Blood Culture results may not be optimal due to an inadequate volume of blood received in  culture bottles   Culture   Final    NO GROWTH 5 DAYS Performed at [facility], [address], [place], [place] [address]    Report Status 09/11/2022 FINAL  Final  Culture, blood (Routine X 2) w Reflex to ID Panel     Status: None   Collection Time: 09/06/22 11:01 AM   Specimen: BLOOD  Result Value Ref Range Status   Specimen Description BLOOD SITE NOT SPECIFIED  Final   Special Requests   Final    BOTTLES DRAWN AEROBIC ONLY Blood Culture results may not be optimal due to an inadequate volume of blood received in culture bottles   Culture   Final    NO GROWTH 5 DAYS Performed at [facility], [address], [place], [place] [address]    Report Status 09/11/2022 FINAL  Final  Culture, blood (Routine X 2) w Reflex to ID Panel     Status: None   Collection Time: 09/09/22 12:33 PM   Specimen: BLOOD  Result Value Ref Range Status   Specimen Description BLOOD BLOOD RIGHT HAND  Final   Special Requests   Final    BOTTLES DRAWN AEROBIC ONLY Blood Culture results may not be optimal due to an inadequate volume of blood received in culture bottles   Culture   Final    NO GROWTH 5 DAYS Performed at [facility], [address], [place], [place] [address]    Report Status 09/14/2022 FINAL  Final  Culture, blood (Routine X 2) w Reflex to ID Panel     Status: None   Collection Time: 09/09/22  1:18 PM   Specimen: BLOOD RIGHT HAND  Result Value Ref Range Status   Specimen Description BLOOD RIGHT HAND  Final   Special Requests   Final    BOTTLES DRAWN AEROBIC ONLY Blood Culture results may not be optimal due to an inadequate volume of blood received in culture bottles   Culture   Final    NO GROWTH 5 DAYS Performed at [facility], [address], [place], [place] [address]    Report Status 09/14/2022 FINAL  Final    Labs: CBC: Recent Labs  Lab 10/01/22 0011 10/01/22 0900  WBC 12.5* 12.4*  NEUTROABS 8.8* 8.9*  HGB 8.6* 8.5*  HCT 28.6* 27.7*  MCV 95.0  96.5  PLT 511* 498*   Basic Metabolic Panel: Recent Labs  Lab 10/01/22 0011 10/01/22 0900  NA 140 141  K 4.3 4.0  CL 103 101  CO2 27 27  GLUCOSE 167* 280*  BUN 45* 48*  CREATININE 2.28* 2.36*  CALCIUM 8.6* 8.6*  MG 1.6* 1.8   Liver Function Tests: Recent Labs  Lab 10/01/22  0900  AST 19  ALT 15  ALKPHOS 61  BILITOT 0.4  PROT 6.1*  ALBUMIN 2.0*   CBG: Recent Labs  Lab 10/01/22 1301 10/01/22 1827  GLUCAP 294* 267*    Discharge time spent: less than 30 minutes.  Signed: Clydie Braun, MD [facility] 10/02/2022

## 2022-10-02 NOTE — Progress Notes (Signed)
Subjective:  Patient ID: Kayla [name], female    DOB: 06-27-1940,  MRN: [id number]  Chief Complaint  Patient presents with   Routine Post Op    DOS: 09/04/2022 Procedure: Left partial First ray amputation  82 y.o. female returns for post-op check.  patient states that she is doing well.  Surgery was done by Dr. Ardelle Anton.  No fever chills vomiting.  Bandages clean dry and intact  Review of Systems: Negative except as noted in the HPI. Denies N/V/F/Ch.  Past Medical History:  Diagnosis Date   Allergy    Anxiety    Arthritis    CAD (coronary artery disease)    Mild per remote cath in 2006, /    Nuclear, January, 2013, no significant abnormality,   Carotid artery disease (HCC)    Doppler, January, 2013, 0 39% RIC A., 40-59% LICA, stable   Cataract    CHF (congestive heart failure) (HCC)    Chronic kidney disease    Complication of anesthesia    wakes up "mean"   COPD 02/16/2007   Intolerant of Spiriva, tudorza Intolerant of Trelegy    COPD (chronic obstructive pulmonary disease) (HCC)    Coronary atherosclerosis 11/24/2008   Catheterization 2006, nonobstructive coronary disease   //   nuclear 2013, low risk     Diabetes mellitus, type 2 (HCC)    Diverticula of colon    DIVERTICULOSIS OF COLON 03/23/2007   Qualifier: Diagnosis of  By: Craige Cotta MD, Vineet     Dizziness    occasional   Dizzy spells 05/04/2011   DM (diabetes mellitus) (HCC) 03/23/2007   Qualifier: Diagnosis of  By: Craige Cotta MD, Vineet     Dyspnea    On exertion   Ejection fraction    Essential hypertension 02/16/2007   Qualifier: Diagnosis of  By: Earlene Plater CMA, Tammy     G E R D 03/23/2007   Qualifier: Diagnosis of  By: Craige Cotta MD, Vineet     Gall bladder disease 04/28/2016   Gastritis and gastroduodenitis    GERD (gastroesophageal reflux disease)    Goiter    hx of   Headache(784.0)    migraine   Hyperlipidemia    Hypertension    Hypothyroidism    HYPOTHYROIDISM, POSTSURGICAL 06/04/2008   Qualifier:  Diagnosis of  By: Everardo All MD, Sean A    Left ventricular hypertrophy    Leg edema    occasional swelling   Leg swelling 12/09/2018   Myofascial pain syndrome, cervical 01/06/2018   Obesity    OBESITY 11/24/2008   Qualifier: Diagnosis of  By: Vikki Ports     OBSTRUCTIVE SLEEP APNEA 02/16/2007   Auto CPAP 09/03/13 to 10/02/13 >> used on 30 of 30 nights with average 6 hrs and 3 min.  Average AHI is 3.1 with median CPAP 5 cm H2O and 95 th percentile CPAP 6 cm H2O.     Occipital neuralgia of right side 01/06/2018   OSA (obstructive sleep apnea)    cpap setting of 12   Pain in joint of right hip 01/21/2018   Palpitations 01/21/2011   48 hour Holter, 2015, scattered PACs and PVCs.    Paroxysmal atrial fibrillation (HCC)    Pneumonia    PONV (postoperative nausea and vomiting)    severe after every surgery   Pulmonary hypertension, secondary    RHINITIS 07/21/2010        RUQ abdominal pain    S/P left TKA 07/27/2011   Sinus node dysfunction (HCC) 11/08/2015  Sleep apnea    Spinal stenosis of cervical region 11/07/2014   Urinary frequency 12/09/2018   UTI (urinary tract infection) 08/15/2019   per pt report   VENTRICULAR HYPERTROPHY, LEFT 11/24/2008   Qualifier: Diagnosis of  By: Vikki Ports      Current Outpatient Medications:    acetaminophen (TYLENOL) 500 MG tablet, Take 1,000-1,500 mg by mouth every 8 (eight) hours as needed for mild pain, moderate pain or headache., Disp: , Rfl:    albuterol (PROVENTIL) (2.5 MG/3ML) 0.083% nebulizer solution, Take 3 mLs (2.5 mg total) by nebulization every 2 (two) hours as needed for wheezing or shortness of breath., Disp: 75 mL, Rfl: 5   albuterol (VENTOLIN HFA) 108 (90 Base) MCG/ACT inhaler, Inhale 2 puffs into the lungs every 6 (six) hours as needed for wheezing or shortness of breath., Disp: 3 each, Rfl: 3   Alcohol Swabs (B-D SINGLE USE SWABS REGULAR) PADS, , Disp: , Rfl:    allopurinol (ZYLOPRIM) 100 MG tablet, Take 100 mg by mouth 2  (two) times daily., Disp: , Rfl:    amiodarone (PACERONE) 200 MG tablet, Take 1 tablet (200 mg total) by mouth daily., Disp: 90 tablet, Rfl: 3   amLODipine (NORVASC) 5 MG tablet, Take 2 tablets (10 mg total) by mouth daily., Disp: , Rfl:    ascorbic acid (VITAMIN C) 500 MG tablet, Take 500 mg by mouth daily with lunch., Disp: , Rfl:    atorvastatin (LIPITOR) 40 MG tablet, Take 1 tablet (40 mg total) by mouth daily. (Patient taking differently: Take 40 mg by mouth at bedtime.), Disp: 90 tablet, Rfl: 3   Blood Glucose Calibration (TRUE METRIX LEVEL 1) Low SOLN, , Disp: , Rfl:    Budeson-Glycopyrrol-Formoterol (BREZTRI AEROSPHERE) 160-9-4.8 MCG/ACT AERO, Inhale 2 puffs into the lungs in the morning and at bedtime., Disp: 3 each, Rfl: 10   Carboxymethylcellulose Sodium (ARTIFICIAL TEARS OP), Place 1 drop into both eyes daily as needed (dry eyes)., Disp: , Rfl:    daptomycin (CUBICIN) IVPB, Inject 600 mg into the vein every other day for 18 days. 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  First Dose: Yes Last Day of Therapy:  10/20/22 Labs - Once weekly:  CBC/D, BMP, and CPK Labs - Every other week:  ESR and CRP Method of administration: IV Push Method of administration may be changed at the discretion of home infusion pharmacist based upon assessment of the patient and/or caregiver's ability to self-administer the medication ordered., Disp: 9 Units, Rfl: 0   ELIQUIS 2.5 MG TABS tablet, TAKE 1 TABLET TWICE DAILY, Disp: 180 tablet, Rfl: 3   ferrous sulfate 325 (65 FE) MG tablet, Take 1 tablet (325 mg total) by mouth daily with breakfast., Disp: 90 tablet, Rfl: 3   fluticasone (FLONASE) 50 MCG/ACT nasal spray, USE 2 SPRAYS NASALLY DAILY (Patient taking differently: Place 1 spray into both nostrils daily as needed for allergies. USE 2 SPRAYS NASALLY DAILY), Disp: 48 g, Rfl: 1   gabapentin (NEURONTIN) 100 MG capsule, Take 2 capsules (200 mg total) by mouth 3 (three) times daily., Disp: , Rfl:    guaiFENesin  (ROBITUSSIN) 100 MG/5ML liquid, Take 5 mLs by mouth every 4 (four) hours as needed for cough or to loosen phlegm., Disp: , Rfl:    HYDROcodone-acetaminophen (NORCO) 10-325 MG tablet, Take 1 tablet by mouth every 6 (six) hours as needed., Disp: 10 tablet, Rfl: 0   HYDROXYZINE HCL PO, Take 25 mg by mouth at bedtime as needed (insomnia)., Disp: ,  Rfl:    insulin aspart (NOVOLOG) 100 UNIT/ML FlexPen, Inject 0-20 Units into the skin See admin instructions. Inject 0-20 units into the skin three times a day before meals "as needed," per SLIDING SCALE, Disp: , Rfl:    insulin glargine (LANTUS) 100 UNIT/ML injection, Inject 0.55 mLs (55 Units total) into the skin 2 (two) times daily. (Patient taking differently: Inject 25-50 Units into the skin See admin instructions. Inject 25 units subcutaneously daily in the morning. Inject 50 units subcutaneously daily at bedtime.  Patient report 90 units in the morning 25 units at bedtime, was adamant regarding dosage.), Disp: 10 mL, Rfl: 11   levothyroxine (SYNTHROID) 200 MCG tablet, Take 200 mcg by mouth daily before breakfast., Disp: , Rfl:    loratadine (CLARITIN) 10 MG tablet, Take 1 tablet (10 mg total) by mouth daily., Disp: , Rfl:    losartan (COZAAR) 25 MG tablet, Take 25 mg by mouth daily., Disp: , Rfl:    melatonin 5 MG TABS, Take 1 tablet (5 mg total) by mouth at bedtime as needed., Disp: , Rfl: 0   nitroGLYCERIN (NITROSTAT) 0.4 MG SL tablet, Place 1 tablet (0.4 mg total) under the tongue every 5 (five) minutes as needed for chest pain., Disp: 25 tablet, Rfl: 4   polyethylene glycol (MIRALAX / GLYCOLAX) 17 g packet, Take 17 g by mouth daily as needed for mild constipation or moderate constipation., Disp: , Rfl: 0   PRESCRIPTION MEDICATION, CPAP- At bedtime and as needed during any time of rest, Disp: , Rfl:    sennosides-docusate sodium (SENOKOT-S) 8.6-50 MG tablet, Take 1 tablet by mouth daily., Disp: , Rfl:    torsemide (DEMADEX) 20 MG tablet, Take 2 tablets  (40 mg total) by mouth daily., Disp: , Rfl:    TRUE METRIX BLOOD GLUCOSE TEST test strip, , Disp: , Rfl:    TRUEplus Lancets 28G MISC, , Disp: , Rfl:    vitamin B-12 (CYANOCOBALAMIN) 1000 MCG tablet, Take 1,000 mcg by mouth daily with lunch., Disp: , Rfl:    Vitamin D3 (VITAMIN D) 25 MCG tablet, Take 1,000 Units by mouth daily with lunch., Disp: , Rfl:   Social History   Tobacco Use  Smoking Status Former   Packs/day: 2.00   Years: 33.00   Additional pack years: 0.00   Total pack years: 66.00   Types: Cigarettes   Start date: 1966   Quit date: 05/04/1997   Years since quitting: 25.4  Smokeless Tobacco Never    Allergies  Allergen Reactions   Dulaglutide Nausea Only, Other (See Comments) and Cough    TRULICITY made the patient sick to her stomach (only) several days after using it   Oxycodone Itching and Other (See Comments)    Pt still takes this med even thought it causes itching   Trelegy Ellipta [Fluticasone-Umeclidin-Vilant] Nausea And Vomiting, Other (See Comments) and Cough    Made the patient sick to her stomach (only) several days after using it    Objective:  There were no vitals filed for this visit. There is no height or weight on file to calculate BMI. Constitutional Well developed. Well nourished.  Vascular Foot warm and well perfused. Capillary refill normal to all digits.   Neurologic Normal speech. Oriented to person, place, and time. Epicritic sensation to light touch grossly present bilaterally.  Dermatologic Skin complete epithelialized.  No signs of Deis is noted.  No infection noted.  The wound has healed  Orthopedic: No further tenderness to palpation noted about  the surgical site.   Radiographs: None Assessment:   1. History of amputation of lesser toe of left foot (HCC)    Plan:  Patient was evaluated and treated and all questions answered.  S/p foot surgery left -Clinically healed and officially discharged from my care if any foot and ankle  issues on future she will come back and see me. No follow-ups on file.

## 2022-10-04 DIAGNOSIS — E1151 Type 2 diabetes mellitus with diabetic peripheral angiopathy without gangrene: Secondary | ICD-10-CM | POA: Diagnosis not present

## 2022-10-04 DIAGNOSIS — E1169 Type 2 diabetes mellitus with other specified complication: Secondary | ICD-10-CM | POA: Diagnosis not present

## 2022-10-04 DIAGNOSIS — M86172 Other acute osteomyelitis, left ankle and foot: Secondary | ICD-10-CM | POA: Diagnosis not present

## 2022-10-04 DIAGNOSIS — I480 Paroxysmal atrial fibrillation: Secondary | ICD-10-CM | POA: Diagnosis not present

## 2022-10-04 DIAGNOSIS — B9562 Methicillin resistant Staphylococcus aureus infection as the cause of diseases classified elsewhere: Secondary | ICD-10-CM | POA: Diagnosis not present

## 2022-10-04 DIAGNOSIS — T827XXA Infection and inflammatory reaction due to other cardiac and vascular devices, implants and grafts, initial encounter: Secondary | ICD-10-CM | POA: Diagnosis not present

## 2022-10-04 DIAGNOSIS — Z4781 Encounter for orthopedic aftercare following surgical amputation: Secondary | ICD-10-CM | POA: Diagnosis not present

## 2022-10-04 DIAGNOSIS — J449 Chronic obstructive pulmonary disease, unspecified: Secondary | ICD-10-CM | POA: Diagnosis not present

## 2022-10-04 DIAGNOSIS — I872 Venous insufficiency (chronic) (peripheral): Secondary | ICD-10-CM | POA: Diagnosis not present

## 2022-10-05 DIAGNOSIS — R001 Bradycardia, unspecified: Secondary | ICD-10-CM | POA: Diagnosis not present

## 2022-10-05 DIAGNOSIS — Z794 Long term (current) use of insulin: Secondary | ICD-10-CM | POA: Diagnosis not present

## 2022-10-05 DIAGNOSIS — B9562 Methicillin resistant Staphylococcus aureus infection as the cause of diseases classified elsewhere: Secondary | ICD-10-CM | POA: Diagnosis not present

## 2022-10-05 DIAGNOSIS — Z6838 Body mass index (BMI) 38.0-38.9, adult: Secondary | ICD-10-CM | POA: Diagnosis not present

## 2022-10-05 DIAGNOSIS — M869 Osteomyelitis, unspecified: Secondary | ICD-10-CM | POA: Diagnosis not present

## 2022-10-05 DIAGNOSIS — Z79899 Other long term (current) drug therapy: Secondary | ICD-10-CM | POA: Diagnosis not present

## 2022-10-05 DIAGNOSIS — R7881 Bacteremia: Secondary | ICD-10-CM | POA: Diagnosis not present

## 2022-10-05 DIAGNOSIS — Z7689 Persons encountering health services in other specified circumstances: Secondary | ICD-10-CM | POA: Diagnosis not present

## 2022-10-05 DIAGNOSIS — E1140 Type 2 diabetes mellitus with diabetic neuropathy, unspecified: Secondary | ICD-10-CM | POA: Diagnosis not present

## 2022-10-06 ENCOUNTER — Encounter: Payer: Self-pay | Admitting: Physician Assistant

## 2022-10-06 ENCOUNTER — Ambulatory Visit: Payer: Medicare HMO | Admitting: Physician Assistant

## 2022-10-06 ENCOUNTER — Ambulatory Visit: Payer: Medicare HMO | Attending: Physician Assistant | Admitting: Physician Assistant

## 2022-10-06 VITALS — BP 132/60 | HR 65 | Ht 65.0 in | Wt 223.6 lb

## 2022-10-06 DIAGNOSIS — L03115 Cellulitis of right lower limb: Secondary | ICD-10-CM | POA: Diagnosis not present

## 2022-10-06 DIAGNOSIS — Z79899 Other long term (current) drug therapy: Secondary | ICD-10-CM

## 2022-10-06 DIAGNOSIS — R001 Bradycardia, unspecified: Secondary | ICD-10-CM | POA: Diagnosis not present

## 2022-10-06 DIAGNOSIS — I480 Paroxysmal atrial fibrillation: Secondary | ICD-10-CM | POA: Diagnosis not present

## 2022-10-06 DIAGNOSIS — I5032 Chronic diastolic (congestive) heart failure: Secondary | ICD-10-CM | POA: Diagnosis not present

## 2022-10-06 DIAGNOSIS — Z950 Presence of cardiac pacemaker: Secondary | ICD-10-CM | POA: Diagnosis not present

## 2022-10-06 DIAGNOSIS — I495 Sick sinus syndrome: Secondary | ICD-10-CM | POA: Diagnosis not present

## 2022-10-06 DIAGNOSIS — N1831 Chronic kidney disease, stage 3a: Secondary | ICD-10-CM

## 2022-10-06 NOTE — Patient Instructions (Signed)
Medication Instructions:  Your physician recommends that you continue on your current medications as directed. Please refer to the Current Medication list given to you today.  *If you need a refill on your cardiac medications before your next appointment, please call your pharmacy*  Lab Work: None ordered If you have labs (blood work) drawn today and your tests are completely normal, you will receive your results only by: MyChart Message (if you have MyChart) OR A paper copy in the mail If you have any lab test that is abnormal or we need to change your treatment, we will call you to review the results.  Follow-Up: At [facility], you and your health needs are our priority.  As part of our continuing mission to provide you with exceptional heart care, we have created designated Provider Care Teams.  These Care Teams include your primary Cardiologist (physician) and Advanced Practice Providers (APPs -  Physician Assistants and Nurse Practitioners) who all work together to provide you with the care you need, when you need it.  We recommend signing up for the patient portal called "MyChart".  Sign up information is provided on this After Visit Summary.  MyChart is used to connect with patients for Virtual Visits (Telemedicine).  Patients are able to view lab/test results, encounter notes, upcoming appointments, etc.  Non-urgent messages can be sent to your provider as well.   To learn more about what you can do with MyChart, go to [email or web address].    Your next appointment:   4-6 month(s)  Provider:   Donato Schultz, MD    Low-Sodium Eating Plan Salt (sodium) helps you keep a healthy balance of fluids in your body. Too much sodium can raise your blood pressure. It can also cause fluid and waste to be held in your body. Your health care provider or dietitian may recommend a low-sodium eating plan if you have high blood pressure (hypertension), kidney disease, liver disease, or  heart failure. Eating less sodium can help lower your blood pressure and reduce swelling. It can also protect your heart, liver, and kidneys. What are tips for following this plan? Reading food labels  Check food labels for the amount of sodium per serving. If you eat more than one serving, you must multiply the listed amount by the number of servings. Choose foods with less than 140 milligrams (mg) of sodium per serving. Avoid foods with 300 mg of sodium or more per serving. Always check how much sodium is in a product, even if the label says "unsalted" or "no salt added." Shopping  Buy products labeled as "low-sodium" or "no salt added." Buy fresh foods. Avoid canned foods and pre-made or frozen meals. Avoid canned, cured, or processed meats. Buy breads that have less than 80 mg of sodium per slice. Cooking  Eat more home-cooked food. Try to eat less restaurant, buffet, and fast food. Try not to add salt when you cook. Use salt-free seasonings or herbs instead of table salt or sea salt. Check with your provider or pharmacist before using salt substitutes. Cook with plant-based oils, such as canola, sunflower, or olive oil. Meal planning When eating at a restaurant, ask if your food can be made with less salt or no salt. Avoid dishes labeled as brined, pickled, cured, or smoked. Avoid dishes made with soy sauce, miso, or teriyaki sauce. Avoid foods that have monosodium glutamate (MSG) in them. MSG may be added to some restaurant food, sauces, soups, bouillon, and canned foods. Make meals that can  be grilled, baked, poached, roasted, or steamed. These are often made with less sodium. General information Try to limit your sodium intake to 1,500-2,300 mg each day, or the amount told by your provider. What foods should I eat? Fruits Fresh, frozen, or canned fruit. Fruit juice. Vegetables Fresh or frozen vegetables. "No salt added" canned vegetables. "No salt added" tomato sauce and paste.  Low-sodium or reduced-sodium tomato and vegetable juice. Grains Low-sodium cereals, such as oats, puffed wheat and rice, and shredded wheat. Low-sodium crackers. Unsalted rice. Unsalted pasta. Low-sodium bread. Whole grain breads and whole grain pasta. Meats and other proteins Fresh or frozen meat, poultry, seafood, and fish. These should have no added salt. Low-sodium canned tuna and salmon. Unsalted nuts. Dried peas, beans, and lentils without added salt. Unsalted canned beans. Eggs. Unsalted nut butters. Dairy Milk. Soy milk. Cheese that is naturally low in sodium, such as ricotta cheese, fresh mozzarella, or Swiss cheese. Low-sodium or reduced-sodium cheese. Cream cheese. Yogurt. Seasonings and condiments Fresh and dried herbs and spices. Salt-free seasonings. Low-sodium mustard and ketchup. Sodium-free salad dressing. Sodium-free light mayonnaise. Fresh or refrigerated horseradish. Lemon juice. Vinegar. Other foods Homemade, reduced-sodium, or low-sodium soups. Unsalted popcorn and pretzels. Low-salt or salt-free chips. The items listed above may not be all the foods and drinks you can have. Talk to a dietitian to learn more. What foods should I avoid? Vegetables Sauerkraut, pickled vegetables, and relishes. Olives. [place] fries. Onion rings. Regular canned vegetables, except low-sodium or reduced-sodium items. Regular canned tomato sauce and paste. Regular tomato and vegetable juice. Frozen vegetables in sauces. Grains Instant hot cereals. Bread stuffing, pancake, and biscuit mixes. Croutons. Seasoned rice or pasta mixes. Noodle soup cups. Boxed or frozen macaroni and cheese. Regular salted crackers. Self-rising flour. Meats and other proteins Meat or fish that is salted, canned, smoked, spiced, or pickled. Precooked or cured meat, such as sausages or meat loaves. Tomasa Blase. Ham. Pepperoni. Hot dogs. Corned beef. Chipped beef. Salt pork. Jerky. Pickled herring, anchovies, and sardines. Regular  canned tuna. Salted nuts. Dairy Processed cheese and cheese spreads. Hard cheeses. Cheese curds. Blue cheese. Feta cheese. String cheese. Regular cottage cheese. Buttermilk. Canned milk. Fats and oils Salted butter. Regular margarine. Ghee. Bacon fat. Seasonings and condiments Onion salt, garlic salt, seasoned salt, table salt, and sea salt. Canned and packaged gravies. Worcestershire sauce. Tartar sauce. Barbecue sauce. Teriyaki sauce. Soy sauce, including reduced-sodium soy sauce. Steak sauce. Fish sauce. Oyster sauce. Cocktail sauce. Horseradish that you find on the shelf. Regular ketchup and mustard. Meat flavorings and tenderizers. Bouillon cubes. Hot sauce. Pre-made or packaged marinades. Pre-made or packaged taco seasonings. Relishes. Regular salad dressings. Salsa. Other foods Salted popcorn and pretzels. Corn chips and puffs. Potato and tortilla chips. Canned or dried soups. Pizza. Frozen entrees and pot pies. The items listed above may not be all the foods and drinks you should avoid. Talk to a dietitian to learn more. This information is not intended to replace advice given to you by your health care provider. Make sure you discuss any questions you have with your health care provider. Document Revised: 05/07/2022 Document Reviewed: 05/07/2022 Elsevier Patient Education  2024 [employer] Heart-Healthy Eating Plan Many factors influence your heart health, including eating and exercise habits. Heart health is also called coronary health. Coronary risk increases with abnormal blood fat (lipid) levels. A heart-healthy eating plan includes limiting unhealthy fats, increasing healthy fats, limiting salt (sodium) intake, and making other diet and lifestyle changes. What is my plan? Your health  care provider may recommend that: You limit your fat intake to _________% or less of your total calories each day. You limit your saturated fat intake to _________% or less of your total calories each  day. You limit the amount of cholesterol in your diet to less than _________ mg per day. You limit the amount of sodium in your diet to less than _________ mg per day. What are tips for following this plan? Cooking Cook foods using methods other than frying. Baking, boiling, grilling, and broiling are all good options. Other ways to reduce fat include: Removing the skin from poultry. Removing all visible fats from meats. Steaming vegetables in water or broth. Meal planning  At meals, imagine dividing your plate into fourths: Fill one-half of your plate with vegetables and green salads. Fill one-fourth of your plate with whole grains. Fill one-fourth of your plate with lean protein foods. Eat 2-4 cups of vegetables per day. One cup of vegetables equals 1 cup (91 g) broccoli or cauliflower florets, 2 medium carrots, 1 large bell pepper, 1 large sweet potato, 1 large tomato, 1 medium white potato, 2 cups (150 g) raw leafy greens. Eat 1-2 cups of fruit per day. One cup of fruit equals 1 small apple, 1 large banana, 1 cup (237 g) mixed fruit, 1 large orange,  cup (82 g) dried fruit, 1 cup (240 mL) 100% fruit juice. Eat more foods that contain soluble fiber. Examples include apples, broccoli, carrots, beans, peas, and barley. Aim to get 25-30 g of fiber per day. Increase your consumption of legumes, nuts, and seeds to 4-5 servings per week. One serving of dried beans or legumes equals  cup (90 g) cooked, 1 serving of nuts is  oz (12 almonds, 24 pistachios, or 7 walnut halves), and 1 serving of seeds equals  oz (8 g). Fats Choose healthy fats more often. Choose monounsaturated and polyunsaturated fats, such as olive and canola oils, avocado oil, flaxseeds, walnuts, almonds, and seeds. Eat more omega-3 fats. Choose salmon, mackerel, sardines, tuna, flaxseed oil, and ground flaxseeds. Aim to eat fish at least 2 times each week. Check food labels carefully to identify foods with trans fats or high  amounts of saturated fat. Limit saturated fats. These are found in animal products, such as meats, butter, and cream. Plant sources of saturated fats include palm oil, palm kernel oil, and coconut oil. Avoid foods with partially hydrogenated oils in them. These contain trans fats. Examples are stick margarine, some tub margarines, cookies, crackers, and other baked goods. Avoid fried foods. General information Eat more home-cooked food and less restaurant, buffet, and fast food. Limit or avoid alcohol. Limit foods that are high in added sugar and simple starches such as foods made using white refined flour (white breads, pastries, sweets). Lose weight if you are overweight. Losing just 5-10% of your body weight can help your overall health and prevent diseases such as diabetes and heart disease. Monitor your sodium intake, especially if you have high blood pressure. Talk with your health care provider about your sodium intake. Try to incorporate more vegetarian meals weekly. What foods should I eat? Fruits All fresh, canned (in natural juice), or frozen fruits. Vegetables Fresh or frozen vegetables (raw, steamed, roasted, or grilled). Green salads. Grains Most grains. Choose whole wheat and whole grains most of the time. Rice and pasta, including brown rice and pastas made with whole wheat. Meats and other proteins Lean, well-trimmed beef, veal, pork, and lamb. Chicken and [place] without skin.  All fish and shellfish. Wild duck, rabbit, pheasant, and venison. Egg whites or low-cholesterol egg substitutes. Dried beans, peas, lentils, and tofu. Seeds and most nuts. Dairy Low-fat or nonfat cheeses, including ricotta and mozzarella. Skim or 1% milk (liquid, powdered, or evaporated). Buttermilk made with low-fat milk. Nonfat or low-fat yogurt. Fats and oils Non-hydrogenated (trans-free) margarines. Vegetable oils, including soybean, sesame, sunflower, olive, avocado, peanut, safflower, corn, canola,  and cottonseed. Salad dressings or mayonnaise made with a vegetable oil. Beverages Water (mineral or sparkling). Coffee and tea. Unsweetened ice tea. Diet beverages. Sweets and desserts Sherbet, gelatin, and fruit ice. Small amounts of dark chocolate. Limit all sweets and desserts. Seasonings and condiments All seasonings and condiments. The items listed above may not be a complete list of foods and beverages you can eat. Contact a dietitian for more options. What foods should I avoid? Fruits Canned fruit in heavy syrup. Fruit in cream or butter sauce. Fried fruit. Limit coconut. Vegetables Vegetables cooked in cheese, cream, or butter sauce. Fried vegetables. Grains Breads made with saturated or trans fats, oils, or whole milk. Croissants. Sweet rolls. Donuts. High-fat crackers, such as cheese crackers and chips. Meats and other proteins Fatty meats, such as hot dogs, ribs, sausage, bacon, rib-eye roast or steak. High-fat deli meats, such as salami and bologna. Caviar. Domestic duck and goose. Organ meats, such as liver. Dairy Cream, sour cream, cream cheese, and creamed cottage cheese. Whole-milk cheeses. Whole or 2% milk (liquid, evaporated, or condensed). Whole buttermilk. Cream sauce or high-fat cheese sauce. Whole-milk yogurt. Fats and oils Meat fat, or shortening. Cocoa butter, hydrogenated oils, palm oil, coconut oil, palm kernel oil. Solid fats and shortenings, including bacon fat, salt pork, lard, and butter. Nondairy cream substitutes. Salad dressings with cheese or sour cream. Beverages Regular sodas and any drinks with added sugar. Sweets and desserts Frosting. Pudding. Cookies. Cakes. Pies. Milk chocolate or white chocolate. Buttered syrups. Full-fat ice cream or ice cream drinks. The items listed above may not be a complete list of foods and beverages to avoid. Contact a dietitian for more information. Summary Heart-healthy meal planning includes limiting unhealthy fats,  increasing healthy fats, limiting salt (sodium) intake and making other diet and lifestyle changes. Lose weight if you are overweight. Losing just 5-10% of your body weight can help your overall health and prevent diseases such as diabetes and heart disease. Focus on eating a balance of foods, including fruits and vegetables, low-fat or nonfat dairy, lean protein, nuts and legumes, whole grains, and heart-healthy oils and fats. This information is not intended to replace advice given to you by your health care provider. Make sure you discuss any questions you have with your health care provider. Document Revised: 05/26/2021 Document Reviewed: 05/26/2021 Elsevier Patient Education  2024 [employer].

## 2022-10-06 NOTE — Progress Notes (Signed)
Office Visit    Patient Name: Kayla [name] Date of Encounter: 10/06/2022  PCP:  Ailene Ravel, MD   [facility]  Cardiologist:  Donato Schultz, MD  Advanced Practice Provider:  No care team member to display Electrophysiologist:  Sherryl Manges, MD   HPI    Kayla [name] is a 82 y.o. female with a past medical history significant for CAD (mild per remote cath 2006), anxiety, CHF, COPD, chronic kidney disease, diabetes mellitus type 2, hyperlipidemia, hypertension, hypothyroidism, OSA on CPAP, paroxysmal atrial fibrillation, pulmonary hypertension presents today for hospital follow-up.  She was last seen by Dr. Anne Fu 09/02/2021.  At that appointment she had recently been admitted to the hospital 07/21/2021 for recurrent septic prepatellar bursitis of the right knee.  Empiric antibiotics were initiated and orthopedic surgery was consulted.  Patient underwent debridement of right knee prepatellar space including combined 5 to 6 inch incision.  Hemovac drain was placed 3/21 and removed 3/23.  ID was consulted and recommended 6 weeks of IV antibiotics.  Wound cultures were positive for MRSA.  Discharged 07/27/2021 on 6 weeks of daptomycin.  Losartan was held due to AKI.  Previously seen for chronic diastolic heart failure, [employer] pacemaker, and moderate CAD by Dr. Graciela Husbands.  She had bouts of bursitis, cellulitis and was hospitalized November 2022 for that issue.  Had many complaints: Shortness of breath and fatigue.  Echocardiogram with EF 65%.  No bleeding episodes.  When she was seen back in May 2023 by Dr. Anne Fu she believes that her right leg had been doing better but still appeared erythematous.  She also suffered from significant right knee pain.  She was on oxygen.  She was complaining of shortness of breath on minimal exertion including walking around her house.  Sometimes her oxygen was dropping down to 85% while walking.  Also, was having some congestion and  cough.  Recently seen in the hospital 01/20/2022.  She was clipping her toenails a couple of weeks ago and and subsequently developed increasing redness and pain and drainage from the site.  She was not on any antibiotics but was recently on a course of steroids for COPD exacerbation.  Of note, she endorsed chronic numbness and tingling at the base of her toes and feet diabetic neuropathy.  She was admitted for diabetic foot infection likely suspected associated with osteomyelitis.  She was last seen by me 9/23, she tells me that her weight has been stable but she has noticed some swelling in her lower extremity and hands since discharge from hospital.  She was discharged on the 100 mg of Demadex and she does not feel like it is making much of a difference.  She also is having some shortness of breath and some chest pain at times.  The pain in her chest comes on when she moves around.  She was on antibiotics for her leg but is not on any at the moment.  She recently had her left toe amputated in the hospital and is in a boot today.  She has not been able to tolerate compression but she has used Ace wrap in the past.  She was seen by Dr. Anne Fu November 2023 and was doing well at that time.  Recommended follow-up in 6 months.   Since then, she was [facility] to the school 09/[facility]/2022.  She was admitted for a clogged PICC line.  Was recently hospitalized 5/2 through 5/14 for left amputation of site infection.  MRI  of the foot showed acute osteomyelitis the patient was started on broad-spectrum antibiotics and underwent left partial amputation 5/3.  Blood cultures were positive for MRSA and there was concern for likely pacemaker lead infection which was extracted 5/7.  ID was consulted and recommended 6 weeks of IV daptomycin with PICC line.  She had underwent 2 weeks of therapy but then was discharged home.  Patient was doing well at home until her PICC line became occluded.  Patient was feeling dizzy and  lightheaded with therapies.  Patient noted that she missed her medications.  Swelling in her legs was better.  She does have follow-up with nephrologist.  Heart rate is low in the ER 30s to 50s with blood pressure maintained.  EP evaluated the patient due to sinus bradycardia and recommended to continue amiodarone but avoid AV nodal blocking agents.  PICC line clogged was resolved.  Patient was discharged.  Today, she tells me that she has not regained her strength yet.  Still fatigued.  Is doing home physical therapy.  She has been home for a week and is asking about the monitor results.  I let her know there is nothing to view today but should be available in the next couple of days.  She is off 3 of her medications Farxiga, metoprolol, and chlorthalidone.  I think it is appropriate to remain off all 3.  Blood pressure is well-controlled today as well as heart rate.  Overall slowly improving.  Left amputated big toe is healing well without any signs of infection.  Some lower extremity edema but better.  Reports no chest pain, pressure, or tightness. No orthopnea, PND. Reports no palpitations.   Past Medical History    Past Medical History:  Diagnosis Date   Allergy    Anxiety    Arthritis    CAD (coronary artery disease)    Mild per remote cath in 2006, /    Nuclear, January, 2013, no significant abnormality,   Carotid artery disease (HCC)    Doppler, January, 2013, 0 39% RIC A., 40-59% LICA, stable   Cataract    CHF (congestive heart failure) (HCC)    Chronic kidney disease    Complication of anesthesia    wakes up "mean"   COPD 02/16/2007   Intolerant of Spiriva, tudorza Intolerant of Trelegy    COPD (chronic obstructive pulmonary disease) (HCC)    Coronary atherosclerosis 11/24/2008   Catheterization 2006, nonobstructive coronary disease   //   nuclear 2013, low risk     Diabetes mellitus, type 2 (HCC)    Diverticula of colon    DIVERTICULOSIS OF COLON 03/23/2007   Qualifier:  Diagnosis of  By: Craige Cotta MD, Vineet     Dizziness    occasional   Dizzy spells 05/04/2011   DM (diabetes mellitus) (HCC) 03/23/2007   Qualifier: Diagnosis of  By: Craige Cotta MD, Vineet     Dyspnea    On exertion   Ejection fraction    Essential hypertension 02/16/2007   Qualifier: Diagnosis of  By: Earlene Plater CMA, Tammy     G E R D 03/23/2007   Qualifier: Diagnosis of  By: Craige Cotta MD, Vineet     Gall bladder disease 04/28/2016   Gastritis and gastroduodenitis    GERD (gastroesophageal reflux disease)    Goiter    hx of   Headache(784.0)    migraine   Hyperlipidemia    Hypertension    Hypothyroidism    HYPOTHYROIDISM, POSTSURGICAL 06/04/2008   Qualifier: Diagnosis of  ByEverardo All MD, Sean A    Left ventricular hypertrophy    Leg edema    occasional swelling   Leg swelling 12/09/2018   Myofascial pain syndrome, cervical 01/06/2018   Obesity    OBESITY 11/24/2008   Qualifier: Diagnosis of  By: Vikki Ports     OBSTRUCTIVE SLEEP APNEA 02/16/2007   Auto CPAP 09/03/13 to 10/02/13 >> used on 30 of 30 nights with average 6 hrs and 3 min.  Average AHI is 3.1 with median CPAP 5 cm H2O and 95 th percentile CPAP 6 cm H2O.     Occipital neuralgia of right side 01/06/2018   OSA (obstructive sleep apnea)    cpap setting of 12   Pain in joint of right hip 01/21/2018   Palpitations 01/21/2011   48 hour Holter, 2015, scattered PACs and PVCs.    Paroxysmal atrial fibrillation (HCC)    Pneumonia    PONV (postoperative nausea and vomiting)    severe after every surgery   Pulmonary hypertension, secondary    RHINITIS 07/21/2010        RUQ abdominal pain    S/P left TKA 07/27/2011   Sinus node dysfunction (HCC) 11/08/2015   Sleep apnea    Spinal stenosis of cervical region 11/07/2014   Urinary frequency 12/09/2018   UTI (urinary tract infection) 08/15/2019   per pt report   VENTRICULAR HYPERTROPHY, LEFT 11/24/2008   Qualifier: Diagnosis of  By: Vikki Ports     Past Surgical History:   Procedure Laterality Date   ABDOMINAL HYSTERECTOMY     with appendectomy and colon polypectomy   AMPUTATION TOE Left 12/15/2019   Procedure: AMPUTATION  LEFT GREAT TOE;  Surgeon: Park Liter, DPM;  Location: [facility];  Service: Podiatry;  Laterality: Left;   AMPUTATION TOE Left 01/22/2022   Procedure: AMPUTATION TOE 4th digit;  Surgeon: Candelaria Stagers, DPM;  Location: [facility];  Service: Podiatry;  Laterality: Left;   AMPUTATION TOE Left 09/04/2022   Procedure: LEFT BIG TOE AMPUTAION;  Surgeon: Vivi Barrack, DPM;  Location: [facility];  Service: Podiatry;  Laterality: Left;   ANTERIOR CERVICAL DECOMP/DISCECTOMY FUSION N/A 11/07/2014   Procedure: Anterior Cervical diskectomy and fusion Cervical four-five, Cervical five-six, Cervical six-seven;  Surgeon: Donalee Citrin, MD;  Location: [facility];  Service: Neurosurgery;  Laterality: N/A;   APPENDECTOMY     ARTERY BIOPSY Right 02/16/2017   Procedure: BIOPSY TEMPORAL ARTERY;  Surgeon: Renford Dills, MD;  Location: [facility];  Service: Vascular;  Laterality: Right;   ARTERY BIOPSY Left 04/16/2020   Procedure: BIOPSY TEMPORAL ARTERY;  Surgeon: Renford Dills, MD;  Location: [facility];  Service: Vascular;  Laterality: Left;  LEFT TEMPLE   BACK SURGERY     CARDIAC CATHETERIZATION     CATARACT EXTRACTION W/ INTRAOCULAR LENS  IMPLANT, BILATERAL Bilateral    CHOLECYSTECTOMY N/A 04/29/2016   Procedure: LAPAROSCOPIC CHOLECYSTECTOMY WITH INTRAOPERATIVE CHOLANGIOGRAM;  Surgeon: Ovidio Kin, MD;  Location: [facility];  Service: General;  Laterality: N/A;   EP IMPLANTABLE DEVICE N/A 11/08/2015   MRI COMPATABLE -- Procedure: Pacemaker Implant;  Surgeon: Duke Salvia, MD;  Location: [facility];  Service: Cardiovascular;  Laterality: N/A;   INCISION AND DRAINAGE ABSCESS Right 05/30/2021   Procedure: IRRIGATION AND DEBRIDEMENT OF PREPATELLA BURSA;  Surgeon: Durene Romans, MD;  Location: [facility];  Service: Orthopedics;  Laterality: Right;  60 wants standard  knee draping and pulse lavage   INCISION AND DRAINAGE ABSCESS Right 07/22/2021  Procedure: INCISION AND DRAINAGE ABSCESS RIGHT KNEE;  Surgeon: Durene Romans, MD;  Location: [facility];  Service: Orthopedics;  Laterality: Right;   INSERT / REPLACE / REMOVE PACEMAKER  2017   Dr. Cl[facility])   IR FLUORO GUIDE CV LINE RIGHT  07/25/2021   IR REMOVAL TUN CV CATH W/O FL  09/04/2021   IR US GUIDE VASC ACCESS RIGHT  07/25/2021   JOINT REPLACEMENT     LEAD EXTRACTION N/A 09/08/2022   Procedure: LEAD EXTRACTION;  Surgeon: Marinus Maw, MD;  [facility]: [facility];  Service: Cardiovascular;  Laterality: N/A;   LEAD EXTRACTION N/A 09/11/2022   Procedure: LEAD EXTRACTION;  Surgeon: Marinus Maw, MD;  [facility]: [facility];  Service: Cardiovascular;  Laterality: N/A;   LUMBAR DISC SURGERY  1990's   x 2   TEE WITHOUT CARDIOVERSION N/A 09/07/2022   Procedure: TRANSESOPHAGEAL ECHOCARDIOGRAM;  Surgeon: Christell Constant, MD;  [facility]: [facility];  Service: Cardiovascular;  Laterality: N/A;   THYROIDECTOMY  2011   TOTAL KNEE ARTHROPLASTY  07/27/2011   Procedure: TOTAL KNEE ARTHROPLASTY;  Surgeon: Shelda Pal, MD;  Location: [facility];  Service: Orthopedics;  Laterality: Left;   UPPER ESOPHAGEAL ENDOSCOPIC ULTRASOUND (EUS) N/A 05/14/2016   Procedure: UPPER ESOPHAGEAL ENDOSCOPIC ULTRASOUND (EUS);  Surgeon: Rachael Fee, MD;  Location: [facility];  Service: Endoscopy;  Laterality: N/A;  radial only-will be done mod if needed    Allergies  Allergies  Allergen Reactions   Dulaglutide Nausea Only, Other (See Comments) and Cough    TRULICITY made the patient sick to her stomach (only) several days after using it   Oxycodone Itching and Other (See Comments)    Pt still takes this med even thought it causes itching   Trelegy Ellipta [Fluticasone-Umeclidin-Vilant] Nausea And Vomiting, Other (See Comments) and Cough    Made the patient sick to her stomach (only) several days after  using it     EKGs/Labs/Other Studies Reviewed:   The following studies were reviewed today: Right LE Venous Doppler 07/22/2021: Summary:  RIGHT:  - There is no evidence of deep vein thrombosis in the lower extremity.  However, portions of this examination were limited- see technologist  comments above.     - No cystic structure found in the popliteal fossa.     LEFT:  - No evidence of common femoral vein obstruction.    Bilateral Carotid Duplex 07/09/2021: Summary:  Right Carotid: Velocities in the right ICA are consistent with a 1-39%  stenosis.   Left Carotid: Velocities in the left ICA are consistent with a 1-39%  stenosis.   Vertebrals:  Bilateral vertebral arteries demonstrate antegrade flow.  Subclavians: Normal flow hemodynamics were seen in bilateral subclavian arteries.    TTE 10/03/20 IMPRESSIONS    1. Left ventricular ejection fraction, by estimation, is 60 to 65%. The  left ventricle has normal function. The left ventricle has no regional  wall motion abnormalities. There is severe concentric left ventricular  hypertrophy. Left ventricular diastolic   parameters are consistent with Grade II diastolic dysfunction  (pseudonormalization). The global longitudinal strain is normal (assessed  in the A4c view with no foreshortening).   2. Right ventricular systolic function is normal. The right ventricular  size is normal. There is normal pulmonary artery systolic pressure.   3. The mitral valve is grossly normal. No evidence of mitral valve  regurgitation.   4. The aortic valve is tricuspid. Aortic valve regurgitation is not  visualized. No aortic stenosis is present.   Comparison(s): A prior study was performed on 08/13/19. Prior images  reviewed side by side. LV is slightly less dynamic.   EKG:  EKG is not ordered today.  Recent Labs: 02/16/2022: NT-Pro BNP 699 10/01/2022: ALT 15; BUN 48; Creatinine, Ser 2.36; Hemoglobin 8.5; Magnesium 1.8; Platelets 498;  Potassium 4.0; Sodium 141  Recent Lipid Panel No results found for: "CHOL", "TRIG", "HDL", "CHOLHDL", "VLDL", "LDLCALC", "LDLDIRECT"  Risk Assessment/Calculations:   CHA2DS2-VASc Score = 7   This indicates a 11.2% annual risk of stroke. The patient's score is based upon: CHF History: 1 HTN History: 1 Diabetes History: 1 Stroke History: 0 Vascular Disease History: 1 Age Score: 2 Gender Score: 1    Home Medications   Current Meds  Medication Sig   acetaminophen (TYLENOL) 500 MG tablet Take 1,000-1,500 mg by mouth every 8 (eight) hours as needed for mild pain, moderate pain or headache.   albuterol (PROVENTIL) (2.5 MG/3ML) 0.083% nebulizer solution Take 3 mLs (2.5 mg total) by nebulization every 2 (two) hours as needed for wheezing or shortness of breath.   albuterol (VENTOLIN HFA) 108 (90 Base) MCG/ACT inhaler Inhale 2 puffs into the lungs every 6 (six) hours as needed for wheezing or shortness of breath.   Alcohol Swabs (B-D SINGLE USE SWABS REGULAR) PADS    allopurinol (ZYLOPRIM) 100 MG tablet Take 100 mg by mouth 2 (two) times daily.   amiodarone (PACERONE) 200 MG tablet Take 1 tablet (200 mg total) by mouth daily.   amLODipine (NORVASC) 5 MG tablet Take 2 tablets (10 mg total) by mouth daily.   ascorbic acid (VITAMIN C) 500 MG tablet Take 500 mg by mouth daily with lunch.   atorvastatin (LIPITOR) 40 MG tablet Take 1 tablet (40 mg total) by mouth daily. (Patient taking differently: Take 40 mg by mouth at bedtime.)   Blood Glucose Calibration (TRUE METRIX LEVEL 1) Low SOLN    Budeson-Glycopyrrol-Formoterol (BREZTRI AEROSPHERE) 160-9-4.8 MCG/ACT AERO Inhale 2 puffs into the lungs in the morning and at bedtime.   Carboxymethylcellulose Sodium (ARTIFICIAL TEARS OP) Place 1 drop into both eyes daily as needed (dry eyes).   daptomycin (CUBICIN) IVPB Inject 600 mg into the vein every other day for 18 days. 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  First Dose: Yes Last Day of  Therapy:  10/20/22 Labs - Once weekly:  CBC/D, BMP, and CPK Labs - Every other week:  ESR and CRP Method of administration: IV Push Method of administration may be changed at the discretion of home infusion pharmacist based upon assessment of the patient and/or caregiver's ability to self-administer the medication ordered.   ELIQUIS 2.5 MG TABS tablet TAKE 1 TABLET TWICE DAILY   ferrous sulfate 325 (65 FE) MG tablet Take 1 tablet (325 mg total) by mouth daily with breakfast.   fluticasone (FLONASE) 50 MCG/ACT nasal spray USE 2 SPRAYS NASALLY DAILY (Patient taking differently: Place 1 spray into both nostrils daily as needed for allergies. USE 2 SPRAYS NASALLY DAILY)   gabapentin (NEURONTIN) 800 MG tablet Take 800 mg by mouth 3 (three) times daily.   guaiFENesin (ROBITUSSIN) 100 MG/5ML liquid Take 5 mLs by mouth every 4 (four) hours as needed for cough or to loosen phlegm.   HYDROcodone-acetaminophen (NORCO) 10-325 MG tablet Take 1 tablet by mouth every 6 (six) hours as needed.   HYDROXYZINE HCL PO Take 25 mg by mouth at bedtime as needed (insomnia).   insulin aspart (NOVOLOG) 100 UNIT/ML  FlexPen Inject 0-20 Units into the skin See admin instructions. Inject 0-20 units into the skin three times a day before meals "as needed," per SLIDING SCALE   insulin glargine (LANTUS) 100 UNIT/ML injection Inject 0.55 mLs (55 Units total) into the skin 2 (two) times daily. (Patient taking differently: Inject 25-50 Units into the skin See admin instructions. Inject 25 units subcutaneously daily in the morning. Inject 50 units subcutaneously daily at bedtime.  Patient report 90 units in the morning 25 units at bedtime, was adamant regarding dosage.)   levothyroxine (SYNTHROID) 200 MCG tablet Take 200 mcg by mouth daily before breakfast.   loratadine (CLARITIN) 10 MG tablet Take 1 tablet (10 mg total) by mouth daily.   losartan (COZAAR) 25 MG tablet Take 25 mg by mouth daily.   melatonin 5 MG TABS Take 1 tablet (5  mg total) by mouth at bedtime as needed.   nitroGLYCERIN (NITROSTAT) 0.4 MG SL tablet Place 1 tablet (0.4 mg total) under the tongue every 5 (five) minutes as needed for chest pain.   polyethylene glycol (MIRALAX / GLYCOLAX) 17 g packet Take 17 g by mouth daily as needed for mild constipation or moderate constipation.   PRESCRIPTION MEDICATION CPAP- At bedtime and as needed during any time of rest   sennosides-docusate sodium (SENOKOT-S) 8.6-50 MG tablet Take 1 tablet by mouth daily.   torsemide (DEMADEX) 20 MG tablet Take 2 tablets (40 mg total) by mouth daily.   TRUE METRIX BLOOD GLUCOSE TEST test strip    TRUEplus Lancets 28G MISC    vitamin B-12 (CYANOCOBALAMIN) 1000 MCG tablet Take 1,000 mcg by mouth daily with lunch.   Vitamin D3 (VITAMIN D) 25 MCG tablet Take 1,000 Units by mouth daily with lunch.     Review of Systems      All other systems reviewed and are otherwise negative except as noted above.  Physical Exam    VS:  BP 132/60   Pulse 65   Ht 5\' 5"  (1.651 m)   Wt 223 lb 9.6 oz (101.4 kg)   SpO2 95%   BMI 37.21 kg/m  , BMI Body mass index is 37.21 kg/m.  Wt Readings from Last 3 Encounters:  10/06/22 223 lb 9.6 oz (101.4 kg)  10/01/22 221 lb 5.5 oz (100.4 kg)  09/13/22 234 lb 2.1 oz (106.2 kg)     GEN: Well nourished, well developed, in no acute distress. HEENT: normal. Neck: Supple, no JVD, carotid bruits, or masses. Cardiac: RRR, no murmurs, rubs, or gallops.  1-2+ pitting edema in bilateral lower ext R > L, Radials/PT 2+ and equal bilaterally.  Respiratory:  Respirations regular and unlabored GI: Soft, nontender, nondistended. MS: No deformity or atrophy. Skin: Warm and dry, no rash. Neuro:  Strength and sensation are intact. Psych: Normal affect.  Assessment & Plan    Bradycardia/junctional rhythm -no nodal agents -HR in the 60s today  Chronic diastolic heart failure -Fluid overloaded today with 1-2 + pitting edema in lower ext. -Weight has been the  same per patient -discharged on Demadex 40mg  daily-follows with nephrology -Suggested lower extremity compression if patient can tolerate   Paroxysmal atrial fibrillation -Continue Eliquis 2.5 mg twice a day -stable and no current symptoms  COPD -Follows Dr. Craige Cotta, but still very symptomatic -She is taking albuterol nebulizer treatment as well as rescue inhaler.  She is on [employer] -Her SOB is likely multifactorial and her uncontrolled COPD is certainly not helping things -On oxygen  Chronic cellulitis of right  lower extremity, recent amputation of left toe -healing well -She has a PICC line in place and is continuing IV antibiotics   Disposition: Follow up 3-4 months  with Donato Schultz, MD or APP.  Signed, Sharlene Dory, PA-C 10/06/2022, 5:44 PM [facility]

## 2022-10-07 ENCOUNTER — Inpatient Hospital Stay: Payer: Medicare HMO | Admitting: Internal Medicine

## 2022-10-07 ENCOUNTER — Other Ambulatory Visit: Payer: Self-pay

## 2022-10-07 ENCOUNTER — Telehealth: Payer: Self-pay

## 2022-10-07 ENCOUNTER — Encounter: Payer: Self-pay | Admitting: Internal Medicine

## 2022-10-07 ENCOUNTER — Ambulatory Visit (INDEPENDENT_AMBULATORY_CARE_PROVIDER_SITE_OTHER): Payer: Medicare HMO | Admitting: Internal Medicine

## 2022-10-07 VITALS — BP 145/65 | HR 64 | Temp 98.2°F

## 2022-10-07 DIAGNOSIS — R7881 Bacteremia: Secondary | ICD-10-CM

## 2022-10-07 DIAGNOSIS — B9562 Methicillin resistant Staphylococcus aureus infection as the cause of diseases classified elsewhere: Secondary | ICD-10-CM

## 2022-10-07 DIAGNOSIS — N2581 Secondary hyperparathyroidism of renal origin: Secondary | ICD-10-CM | POA: Diagnosis not present

## 2022-10-07 DIAGNOSIS — D631 Anemia in chronic kidney disease: Secondary | ICD-10-CM | POA: Diagnosis not present

## 2022-10-07 DIAGNOSIS — I130 Hypertensive heart and chronic kidney disease with heart failure and stage 1 through stage 4 chronic kidney disease, or unspecified chronic kidney disease: Secondary | ICD-10-CM | POA: Diagnosis not present

## 2022-10-07 DIAGNOSIS — N184 Chronic kidney disease, stage 4 (severe): Secondary | ICD-10-CM | POA: Diagnosis not present

## 2022-10-07 DIAGNOSIS — I5030 Unspecified diastolic (congestive) heart failure: Secondary | ICD-10-CM | POA: Diagnosis not present

## 2022-10-07 DIAGNOSIS — R809 Proteinuria, unspecified: Secondary | ICD-10-CM | POA: Diagnosis not present

## 2022-10-07 LAB — SEDIMENTATION RATE: Sed Rate: 130 mm/h — ABNORMAL HIGH (ref 0–30)

## 2022-10-07 LAB — CBC WITH DIFFERENTIAL/PLATELET
Absolute Monocytes: 738 cells/uL (ref 200–950)
Eosinophils Relative: 2 %
Platelets: 453 10*3/uL — ABNORMAL HIGH (ref 140–400)
RDW: 14.2 % (ref 11.0–15.0)

## 2022-10-07 NOTE — Telephone Encounter (Signed)
Per Dr. Thedore Mins called [facility] for recent lab report. Not able to reach anyone at this time. Left voicemail requesting labs be faxed to office. Juanita Laster, RMA

## 2022-10-07 NOTE — Progress Notes (Signed)
Labs drawn via PICC line per Dr. Thedore Mins. Line flushed with 10 mL normal saline, heparin locked, and clamped. Patient tolerated procedure well.   No BioPatch or CHG patch present over insertion site. Patient reports her home health nurse is coming to change her PICC dressing today. Provided Kayla [name] with written instructions to ask her [facility] RN to make sure a BioPatch is placed.   Kayla Ano, RN

## 2022-10-07 NOTE — Progress Notes (Signed)
Patient: Kayla [name]  DOB: [birth year] MRN: [id number] PCP: Ailene Ravel, MD    Patient Active Problem List   Diagnosis Date Noted   Bradycardia 10/01/2022   MRSA bacteremia 10/01/2022   Acute kidney injury superimposed on chronic kidney disease (HCC) 10/01/2022   Complication of central venous catheter 10/01/2022   Osteomyelitis (HCC) 10/01/2022   Closed nondisplaced fracture of proximal phalanx of right great toe 09/06/2022   Pyogenic inflammation of bone (HCC) 09/05/2022   Acute hematogenous osteomyelitis of left foot (HCC) 09/04/2022   Diabetic foot infection (HCC) 09/03/2022   Chronic respiratory failure with hypoxia (HCC) 02/20/2022   Pleuritic pain 02/20/2022   Toe osteomyelitis, left (HCC) 01/20/2022   Carotid bruit 07/07/2021   Septic prepatellar bursitis of right knee 05/30/2021   Cellulitis of right lower extremity 03/19/2021   Chronic kidney disease, stage 3b (HCC) 03/19/2021   Polymyalgia rheumatica (HCC) 04/15/2020   Temporal arteritis (HCC) 04/15/2020   Pacemaker - MDT 01/24/2020   Orthostatic lightheadedness 01/24/2020   Chronic osteomyelitis involving left ankle and foot (HCC)    Paroxysmal atrial fibrillation (HCC) 10/25/2019   Secondary hypercoagulable state (HCC) 10/25/2019   Numbness and tingling of right leg 10/19/2019   Lumbar radiculopathy 10/19/2019   Chronic diastolic heart failure (HCC) 08/13/2019   Chest pain 08/13/2019   Leukocytosis 08/13/2019   Leg swelling 12/09/2018   Urinary frequency 12/09/2018   Pain in joint of right hip 01/21/2018   Myofascial pain syndrome, cervical 01/06/2018   Occipital neuralgia of right side 01/06/2018   RUQ abdominal pain    Gastritis and gastroduodenitis    Bile duct abnormality    Cholecystitis 04/30/2016   Gall bladder disease 04/28/2016   Sinus node dysfunction (HCC) 11/08/2015   Ejection fraction    Spinal stenosis of cervical region 11/07/2014   Preoperative clearance 10/09/2014    Carotid artery disease (HCC)    S/P left TKA 07/27/2011   Dizzy spells 05/04/2011   Palpitations 01/21/2011   RHINITIS 07/21/2010   Hyperlipidemia associated with type 2 diabetes mellitus (HCC) 11/24/2008   OBESITY 11/24/2008   Coronary atherosclerosis 11/24/2008   VENTRICULAR HYPERTROPHY, LEFT 11/24/2008   HYPOTHYROIDISM, POSTSURGICAL 06/04/2008   Headache 06/04/2008   Insulin dependent type 2 diabetes mellitus (HCC) 03/23/2007   Esophageal reflux 03/23/2007   DIVERTICULOSIS OF COLON 03/23/2007   GENERALIZED OSTEOARTHROSIS UNSPECIFIED SITE 03/23/2007   OSA (obstructive sleep apnea) 02/16/2007   Hypertension associated with diabetes (HCC) 02/16/2007   COPD (chronic obstructive pulmonary disease) (HCC) 02/16/2007     Subjective:  82 year old female with past medical history of cellulitis A1c 6.8, PAF on Eliquis, CKD stage IV, recurrent septic prepatellar bursitis status post washout in March 2023 or cultures grew MRSA treated with daptomycin x 6 weeks presents for hospital follow-up MRSA bacteremia secondary to CAD status post extraction likely seeded from 5.  She was sent to [facility] ED at request of podiatry due to concern for possible osteo on x-ray.  Found to have abscess with sinus tract to proximal phalanx on MRI.  Concern for superimposed osteomyelitis.  Metallic body on fourth and fifth metatarsal head.  Taken to the OR for partial left first ray amputation with ID Dr. Loreta Ave.  Patient states she had been on Doxy x 10 days prior to hospitalization.  Or cultures no growth.  TTE showed mobile mass on right atrial lead.  TEE showed RV lead vegetation.  Blood cultures grew MRSA on 5/2.  EP engaged and PPM extracted.  ID engaged discharged on daptomycin x 6 weeks from extraction 5/7 EOT 10/20/2022.  Patient had temp pacemaker at that. Review of Systems  All other systems reviewed and are negative.   Past Medical History:  Diagnosis Date   Allergy    Anxiety    Arthritis    CAD (coronary  artery disease)    Mild per remote cath in 2006, /    Nuclear, January, 2013, no significant abnormality,   Carotid artery disease (HCC)    Doppler, January, 2013, 0 39% RIC A., 40-59% LICA, stable   Cataract    CHF (congestive heart failure) (HCC)    Chronic kidney disease    Complication of anesthesia    wakes up "mean"   COPD 02/16/2007   Intolerant of Spiriva, tudorza Intolerant of Trelegy    COPD (chronic obstructive pulmonary disease) (HCC)    Coronary atherosclerosis 11/24/2008   Catheterization 2006, nonobstructive coronary disease   //   nuclear 2013, low risk     Diabetes mellitus, type 2 (HCC)    Diverticula of colon    DIVERTICULOSIS OF COLON 03/23/2007   Qualifier: Diagnosis of  By: Craige Cotta MD, Vineet     Dizziness    occasional   Dizzy spells 05/04/2011   DM (diabetes mellitus) (HCC) 03/23/2007   Qualifier: Diagnosis of  By: Craige Cotta MD, Vineet     Dyspnea    On exertion   Ejection fraction    Essential hypertension 02/16/2007   Qualifier: Diagnosis of  By: Earlene Plater CMA, Tammy     G E R D 03/23/2007   Qualifier: Diagnosis of  By: Craige Cotta MD, Vineet     Gall bladder disease 04/28/2016   Gastritis and gastroduodenitis    GERD (gastroesophageal reflux disease)    Goiter    hx of   Headache(784.0)    migraine   Hyperlipidemia    Hypertension    Hypothyroidism    HYPOTHYROIDISM, POSTSURGICAL 06/04/2008   Qualifier: Diagnosis of  By: Everardo All MD, Sean A    Left ventricular hypertrophy    Leg edema    occasional swelling   Leg swelling 12/09/2018   Myofascial pain syndrome, cervical 01/06/2018   Obesity    OBESITY 11/24/2008   Qualifier: Diagnosis of  By: Vikki Ports     OBSTRUCTIVE SLEEP APNEA 02/16/2007   Auto CPAP 09/03/13 to 10/02/13 >> used on 30 of 30 nights with average 6 hrs and 3 min.  Average AHI is 3.1 with median CPAP 5 cm H2O and 95 th percentile CPAP 6 cm H2O.     Occipital neuralgia of right side 01/06/2018   OSA (obstructive sleep apnea)    cpap setting  of 12   Pain in joint of right hip 01/21/2018   Palpitations 01/21/2011   48 hour Holter, 2015, scattered PACs and PVCs.    Paroxysmal atrial fibrillation (HCC)    Pneumonia    PONV (postoperative nausea and vomiting)    severe after every surgery   Pulmonary hypertension, secondary    RHINITIS 07/21/2010        RUQ abdominal pain    S/P left TKA 07/27/2011   Sinus node dysfunction (HCC) 11/08/2015   Sleep apnea    Spinal stenosis of cervical region 11/07/2014   Urinary frequency 12/09/2018   UTI (urinary tract infection) 08/15/2019   per pt report   VENTRICULAR HYPERTROPHY, LEFT 11/24/2008   Qualifier: Diagnosis of  By: Vikki Ports      Outpatient Medications Prior to Visit  Medication Sig Dispense Refill   acetaminophen (TYLENOL) 500 MG tablet Take 1,000-1,500 mg by mouth every 8 (eight) hours as needed for mild pain, moderate pain or headache.     albuterol (PROVENTIL) (2.5 MG/3ML) 0.083% nebulizer solution Take 3 mLs (2.5 mg total) by nebulization every 2 (two) hours as needed for wheezing or shortness of breath. 75 mL 5   albuterol (VENTOLIN HFA) 108 (90 Base) MCG/ACT inhaler Inhale 2 puffs into the lungs every 6 (six) hours as needed for wheezing or shortness of breath. 3 each 3   Alcohol Swabs (B-D SINGLE USE SWABS REGULAR) PADS      allopurinol (ZYLOPRIM) 100 MG tablet Take 100 mg by mouth 2 (two) times daily.     amiodarone (PACERONE) 200 MG tablet Take 1 tablet (200 mg total) by mouth daily. 90 tablet 3   amLODipine (NORVASC) 5 MG tablet Take 2 tablets (10 mg total) by mouth daily.     ascorbic acid (VITAMIN C) 500 MG tablet Take 500 mg by mouth daily with lunch.     atorvastatin (LIPITOR) 40 MG tablet Take 1 tablet (40 mg total) by mouth daily. (Patient taking differently: Take 40 mg by mouth at bedtime.) 90 tablet 3   Blood Glucose Calibration (TRUE METRIX LEVEL 1) Low SOLN      Budeson-Glycopyrrol-Formoterol (BREZTRI AEROSPHERE) 160-9-4.8 MCG/ACT AERO Inhale 2 puffs  into the lungs in the morning and at bedtime. 3 each 10   Carboxymethylcellulose Sodium (ARTIFICIAL TEARS OP) Place 1 drop into both eyes daily as needed (dry eyes).     daptomycin (CUBICIN) IVPB Inject 600 mg into the vein every other day for 18 days. 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  First Dose: Yes Last Day of Therapy:  10/20/22 Labs - Once weekly:  CBC/D, BMP, and CPK Labs - Every other week:  ESR and CRP Method of administration: IV Push Method of administration may be changed at the discretion of home infusion pharmacist based upon assessment of the patient and/or caregiver's ability to self-administer the medication ordered. 9 Units 0   ELIQUIS 2.5 MG TABS tablet TAKE 1 TABLET TWICE DAILY 180 tablet 3   ferrous sulfate 325 (65 FE) MG tablet Take 1 tablet (325 mg total) by mouth daily with breakfast. 90 tablet 3   fluticasone (FLONASE) 50 MCG/ACT nasal spray USE 2 SPRAYS NASALLY DAILY (Patient taking differently: Place 1 spray into both nostrils daily as needed for allergies. USE 2 SPRAYS NASALLY DAILY) 48 g 1   gabapentin (NEURONTIN) 800 MG tablet Take 800 mg by mouth 3 (three) times daily.     guaiFENesin (ROBITUSSIN) 100 MG/5ML liquid Take 5 mLs by mouth every 4 (four) hours as needed for cough or to loosen phlegm.     HYDROcodone-acetaminophen (NORCO) 10-325 MG tablet Take 1 tablet by mouth every 6 (six) hours as needed. 10 tablet 0   HYDROXYZINE HCL PO Take 25 mg by mouth at bedtime as needed (insomnia).     insulin aspart (NOVOLOG) 100 UNIT/ML FlexPen Inject 0-20 Units into the skin See admin instructions. Inject 0-20 units into the skin three times a day before meals "as needed," per SLIDING SCALE     insulin glargine (LANTUS) 100 UNIT/ML injection Inject 0.55 mLs (55 Units total) into the skin 2 (two) times daily. (Patient taking differently: Inject 25-50 Units into the skin See admin instructions. Inject 25 units subcutaneously daily in the morning. Inject 50 units  subcutaneously daily at bedtime.  Patient report 90 units  in the morning 25 units at bedtime, was adamant regarding dosage.) 10 mL 11   levothyroxine (SYNTHROID) 200 MCG tablet Take 200 mcg by mouth daily before breakfast.     loratadine (CLARITIN) 10 MG tablet Take 1 tablet (10 mg total) by mouth daily.     losartan (COZAAR) 25 MG tablet Take 25 mg by mouth daily.     melatonin 5 MG TABS Take 1 tablet (5 mg total) by mouth at bedtime as needed.  0   nitroGLYCERIN (NITROSTAT) 0.4 MG SL tablet Place 1 tablet (0.4 mg total) under the tongue every 5 (five) minutes as needed for chest pain. 25 tablet 4   polyethylene glycol (MIRALAX / GLYCOLAX) 17 g packet Take 17 g by mouth daily as needed for mild constipation or moderate constipation.  0   PRESCRIPTION MEDICATION CPAP- At bedtime and as needed during any time of rest     sennosides-docusate sodium (SENOKOT-S) 8.6-50 MG tablet Take 1 tablet by mouth daily.     torsemide (DEMADEX) 20 MG tablet Take 2 tablets (40 mg total) by mouth daily.     TRUE METRIX BLOOD GLUCOSE TEST test strip      TRUEplus Lancets 28G MISC      vitamin B-12 (CYANOCOBALAMIN) 1000 MCG tablet Take 1,000 mcg by mouth daily with lunch.     Vitamin D3 (VITAMIN D) 25 MCG tablet Take 1,000 Units by mouth daily with lunch.     No facility-administered medications prior to visit.     Allergies  Allergen Reactions   Dulaglutide Nausea Only, Other (See Comments) and Cough    TRULICITY made the patient sick to her stomach (only) several days after using it   Oxycodone Itching and Other (See Comments)    Pt still takes this med even thought it causes itching   Trelegy Ellipta [Fluticasone-Umeclidin-Vilant] Nausea And Vomiting, Other (See Comments) and Cough    Made the patient sick to her stomach (only) several days after using it     Social History   Tobacco Use   Smoking status: Former    Packs/day: 2.00    Years: 33.00    Additional pack years: 0.00    Total pack years:  66.00    Types: Cigarettes    Start date: 1966    Quit date: 05/04/1997    Years since quitting: 25.4   Smokeless tobacco: Never  Vaping Use   Vaping Use: Never used  Substance Use Topics   Alcohol use: No   Drug use: No    Family History  Problem Relation Age of Onset   Heart Problems Mother    Heart defect Mother        valve problems   Heart Problems Father    Heart disease Brother    Lung cancer Daughter        non small cell    Diabetes Maternal Aunt    Diabetes Maternal Uncle    Throat cancer Brother    Colon cancer Neg Hx    Colon polyps Neg Hx    Esophageal cancer Neg Hx    Rectal cancer Neg Hx    Stomach cancer Neg Hx     Objective:  There were no vitals filed for this visit. There is no height or weight on file to calculate BMI.  Physical Exam Constitutional:      Appearance: Normal appearance.  HENT:     Head: Normocephalic and atraumatic.     Right Ear: Tympanic membrane normal.  Left Ear: Tympanic membrane normal.     Nose: Nose normal.     Mouth/Throat:     Mouth: Mucous membranes are moist.  Eyes:     Extraocular Movements: Extraocular movements intact.     Conjunctiva/sclera: Conjunctivae normal.     Pupils: Pupils are equal, round, and reactive to light.  Cardiovascular:     Rate and Rhythm: Normal rate and regular rhythm.     Heart sounds: No murmur heard.    No friction rub. No gallop.  Pulmonary:     Effort: Pulmonary effort is normal.     Breath sounds: Normal breath sounds.  Abdominal:     General: Abdomen is flat.     Palpations: Abdomen is soft.  Musculoskeletal:        General: Normal range of motion.  Skin:    General: Skin is warm and dry.  Neurological:     General: No focal deficit present.     Mental Status: She is alert and oriented to person, place, and time.  Psychiatric:        Mood and Affect: Mood normal.     Lab Results: Lab Results  Component Value Date   WBC 12.4 (H) 10/01/2022   HGB 8.5 (L) 10/01/2022    HCT 27.7 (L) 10/01/2022   MCV 96.5 10/01/2022   PLT 498 (H) 10/01/2022    Lab Results  Component Value Date   CREATININE 2.36 (H) 10/01/2022   BUN 48 (H) 10/01/2022   NA 141 10/01/2022   K 4.0 10/01/2022   CL 101 10/01/2022   CO2 27 10/01/2022    Lab Results  Component Value Date   ALT 15 10/01/2022   AST 19 10/01/2022   ALKPHOS 61 10/01/2022   BILITOT 0.4 10/01/2022     Assessment & Plan:      #MRSA bacteremia 2/2 CIED likely seeded by foot #CIED infection SP extraction - Blood cultures on 5/2 grew 2/2 MRSA  -Pt had a heart monitor x 2 weeks. No temp PPM. Unclear if PPM needs to placed back, montor results pending.  -MRSA is Dapt sens - Conitnue dapto x 6 weeks EOT 6/18 -F/U on 6/13   #DFU is post first ray amputation on 5/3 noted that was no purulence or proximal tracking.OR Cx NG -Wound is healed  #Medication monitoring  -Unable to get labs, called HH -We drew labs in clinic.   #Diabetes mellitus - A1c 6.8 on 09/04/2022   #PAF on Eliquis - on eliquis -aeen by cards on 10/06/22   #CKD stage IV -Saw nephrology today   Danelle Earthly, MD [facility]   10/07/22  7:29 AM   I have personally spent 45 minutes involved in face-to-face and non-face-to-face activities for this patient on the day of the visit. Professional time spent includes the following activities: Preparing to see the patient (review of tests), Obtaining and/or reviewing separately obtained history (admission/discharge record), Performing a medically appropriate examination and/or evaluation , Ordering medications/tests/procedures, referring and communicating with other health care professionals, Documenting clinical information in the EMR, Independently interpreting results (not separately reported), Communicating results to the patient/family/caregiver, Counseling and educating the patient/family/caregiver and Care coordination (not separately  reported).

## 2022-10-08 DIAGNOSIS — T827XXA Infection and inflammatory reaction due to other cardiac and vascular devices, implants and grafts, initial encounter: Secondary | ICD-10-CM | POA: Diagnosis not present

## 2022-10-08 DIAGNOSIS — J449 Chronic obstructive pulmonary disease, unspecified: Secondary | ICD-10-CM | POA: Diagnosis not present

## 2022-10-08 DIAGNOSIS — I480 Paroxysmal atrial fibrillation: Secondary | ICD-10-CM | POA: Diagnosis not present

## 2022-10-08 DIAGNOSIS — M86172 Other acute osteomyelitis, left ankle and foot: Secondary | ICD-10-CM | POA: Diagnosis not present

## 2022-10-08 DIAGNOSIS — I872 Venous insufficiency (chronic) (peripheral): Secondary | ICD-10-CM | POA: Diagnosis not present

## 2022-10-08 DIAGNOSIS — Z4781 Encounter for orthopedic aftercare following surgical amputation: Secondary | ICD-10-CM | POA: Diagnosis not present

## 2022-10-08 DIAGNOSIS — B9562 Methicillin resistant Staphylococcus aureus infection as the cause of diseases classified elsewhere: Secondary | ICD-10-CM | POA: Diagnosis not present

## 2022-10-08 DIAGNOSIS — E1151 Type 2 diabetes mellitus with diabetic peripheral angiopathy without gangrene: Secondary | ICD-10-CM | POA: Diagnosis not present

## 2022-10-08 DIAGNOSIS — E1169 Type 2 diabetes mellitus with other specified complication: Secondary | ICD-10-CM | POA: Diagnosis not present

## 2022-10-08 LAB — CBC WITH DIFFERENTIAL/PLATELET
Basophils Absolute: 71 cells/uL (ref 0–200)
Basophils Relative: 0.6 %
Eosinophils Absolute: 238 cells/uL (ref 15–500)
HCT: 28 % — ABNORMAL LOW (ref 35.0–45.0)
Hemoglobin: 8.9 g/dL — ABNORMAL LOW (ref 11.7–15.5)
Lymphs Abs: 1833 cells/uL (ref 850–3900)
MCH: 28.9 pg (ref 27.0–33.0)
MCHC: 31.8 g/dL — ABNORMAL LOW (ref 32.0–36.0)
MCV: 90.9 fL (ref 80.0–100.0)
MPV: 9.7 fL (ref 7.5–12.5)
Monocytes Relative: 6.2 %
Neutro Abs: 9020 cells/uL — ABNORMAL HIGH (ref 1500–7800)
Neutrophils Relative %: 75.8 %
RBC: 3.08 10*6/uL — ABNORMAL LOW (ref 3.80–5.10)
Total Lymphocyte: 15.4 %
WBC: 11.9 10*3/uL — ABNORMAL HIGH (ref 3.8–10.8)

## 2022-10-08 LAB — COMPLETE METABOLIC PANEL WITH GFR
AG Ratio: 0.9 (calc) — ABNORMAL LOW (ref 1.0–2.5)
ALT: 9 U/L (ref 6–29)
AST: 10 U/L (ref 10–35)
Albumin: 3.1 g/dL — ABNORMAL LOW (ref 3.6–5.1)
Alkaline phosphatase (APISO): 81 U/L (ref 37–153)
BUN/Creatinine Ratio: 27 (calc) — ABNORMAL HIGH (ref 6–22)
BUN: 57 mg/dL — ABNORMAL HIGH (ref 7–25)
CO2: 26 mmol/L (ref 20–32)
Calcium: 9 mg/dL (ref 8.6–10.4)
Chloride: 104 mmol/L (ref 98–110)
Creat: 2.1 mg/dL — ABNORMAL HIGH (ref 0.60–0.95)
Globulin: 3.3 g/dL (calc) (ref 1.9–3.7)
Glucose, Bld: 188 mg/dL — ABNORMAL HIGH (ref 65–99)
Potassium: 4.2 mmol/L (ref 3.5–5.3)
Sodium: 142 mmol/L (ref 135–146)
Total Bilirubin: 0.2 mg/dL (ref 0.2–1.2)
Total Protein: 6.4 g/dL (ref 6.1–8.1)
eGFR: 23 mL/min/{1.73_m2} — ABNORMAL LOW (ref >=60)

## 2022-10-08 LAB — C-REACTIVE PROTEIN: CRP: 9.6 mg/L — ABNORMAL HIGH (ref <8.0)

## 2022-10-08 LAB — LAB REPORT - SCANNED: EGFR: 23

## 2022-10-08 LAB — CK: Total CK: 69 U/L (ref 29–143)

## 2022-10-09 DIAGNOSIS — I480 Paroxysmal atrial fibrillation: Secondary | ICD-10-CM | POA: Diagnosis not present

## 2022-10-09 DIAGNOSIS — T827XXA Infection and inflammatory reaction due to other cardiac and vascular devices, implants and grafts, initial encounter: Secondary | ICD-10-CM | POA: Diagnosis not present

## 2022-10-09 DIAGNOSIS — E1151 Type 2 diabetes mellitus with diabetic peripheral angiopathy without gangrene: Secondary | ICD-10-CM | POA: Diagnosis not present

## 2022-10-09 DIAGNOSIS — E1169 Type 2 diabetes mellitus with other specified complication: Secondary | ICD-10-CM | POA: Diagnosis not present

## 2022-10-09 DIAGNOSIS — Z4781 Encounter for orthopedic aftercare following surgical amputation: Secondary | ICD-10-CM | POA: Diagnosis not present

## 2022-10-09 DIAGNOSIS — I872 Venous insufficiency (chronic) (peripheral): Secondary | ICD-10-CM | POA: Diagnosis not present

## 2022-10-09 DIAGNOSIS — M86172 Other acute osteomyelitis, left ankle and foot: Secondary | ICD-10-CM | POA: Diagnosis not present

## 2022-10-09 DIAGNOSIS — J449 Chronic obstructive pulmonary disease, unspecified: Secondary | ICD-10-CM | POA: Diagnosis not present

## 2022-10-09 DIAGNOSIS — B9562 Methicillin resistant Staphylococcus aureus infection as the cause of diseases classified elsewhere: Secondary | ICD-10-CM | POA: Diagnosis not present

## 2022-10-10 DIAGNOSIS — R7881 Bacteremia: Secondary | ICD-10-CM | POA: Diagnosis not present

## 2022-10-12 DIAGNOSIS — E113413 Type 2 diabetes mellitus with severe nonproliferative diabetic retinopathy with macular edema, bilateral: Secondary | ICD-10-CM | POA: Diagnosis not present

## 2022-10-12 NOTE — Addendum Note (Signed)
Encounter addended by: Andee Lineman A on: 10/12/2022 3:24 PM  Actions taken: Imaging Exam ended

## 2022-10-13 DIAGNOSIS — Z4781 Encounter for orthopedic aftercare following surgical amputation: Secondary | ICD-10-CM | POA: Diagnosis not present

## 2022-10-13 DIAGNOSIS — B9562 Methicillin resistant Staphylococcus aureus infection as the cause of diseases classified elsewhere: Secondary | ICD-10-CM | POA: Diagnosis not present

## 2022-10-13 DIAGNOSIS — T827XXA Infection and inflammatory reaction due to other cardiac and vascular devices, implants and grafts, initial encounter: Secondary | ICD-10-CM | POA: Diagnosis not present

## 2022-10-13 DIAGNOSIS — E1151 Type 2 diabetes mellitus with diabetic peripheral angiopathy without gangrene: Secondary | ICD-10-CM | POA: Diagnosis not present

## 2022-10-13 DIAGNOSIS — I480 Paroxysmal atrial fibrillation: Secondary | ICD-10-CM | POA: Diagnosis not present

## 2022-10-13 DIAGNOSIS — I872 Venous insufficiency (chronic) (peripheral): Secondary | ICD-10-CM | POA: Diagnosis not present

## 2022-10-13 DIAGNOSIS — J449 Chronic obstructive pulmonary disease, unspecified: Secondary | ICD-10-CM | POA: Diagnosis not present

## 2022-10-13 DIAGNOSIS — M86172 Other acute osteomyelitis, left ankle and foot: Secondary | ICD-10-CM | POA: Diagnosis not present

## 2022-10-13 DIAGNOSIS — E1169 Type 2 diabetes mellitus with other specified complication: Secondary | ICD-10-CM | POA: Diagnosis not present

## 2022-10-15 ENCOUNTER — Telehealth: Payer: Self-pay

## 2022-10-15 ENCOUNTER — Encounter: Payer: Self-pay | Admitting: Internal Medicine

## 2022-10-15 ENCOUNTER — Other Ambulatory Visit: Payer: Self-pay

## 2022-10-15 ENCOUNTER — Ambulatory Visit: Payer: Medicare HMO | Admitting: Internal Medicine

## 2022-10-15 VITALS — BP 179/62 | HR 65 | Temp 97.8°F | Resp 16

## 2022-10-15 DIAGNOSIS — R7881 Bacteremia: Secondary | ICD-10-CM

## 2022-10-15 DIAGNOSIS — B9562 Methicillin resistant Staphylococcus aureus infection as the cause of diseases classified elsewhere: Secondary | ICD-10-CM | POA: Diagnosis not present

## 2022-10-15 NOTE — Progress Notes (Signed)
Patient: Kayla [name]  DOB: [birth year] MRN: [id number] PCP: Ailene Ravel, MD   Chief Complaint  Patient presents with   Follow-up    MRSA bacteremia      Patient Active Problem List   Diagnosis Date Noted   Bradycardia 10/01/2022   MRSA bacteremia 10/01/2022   Acute kidney injury superimposed on chronic kidney disease (HCC) 10/01/2022   Complication of central venous catheter 10/01/2022   Osteomyelitis (HCC) 10/01/2022   Closed nondisplaced fracture of proximal phalanx of right great toe 09/06/2022   Pyogenic inflammation of bone (HCC) 09/05/2022   Acute hematogenous osteomyelitis of left foot (HCC) 09/04/2022   Diabetic foot infection (HCC) 09/03/2022   Chronic respiratory failure with hypoxia (HCC) 02/20/2022   Pleuritic pain 02/20/2022   Toe osteomyelitis, left (HCC) 01/20/2022   Carotid bruit 07/07/2021   Septic prepatellar bursitis of right knee 05/30/2021   Cellulitis of right lower extremity 03/19/2021   Chronic kidney disease, stage 3b (HCC) 03/19/2021   Polymyalgia rheumatica (HCC) 04/15/2020   Temporal arteritis (HCC) 04/15/2020   Pacemaker - MDT 01/24/2020   Orthostatic lightheadedness 01/24/2020   Chronic osteomyelitis involving left ankle and foot (HCC)    Paroxysmal atrial fibrillation (HCC) 10/25/2019   Secondary hypercoagulable state (HCC) 10/25/2019   Numbness and tingling of right leg 10/19/2019   Lumbar radiculopathy 10/19/2019   Chronic diastolic heart failure (HCC) 08/13/2019   Chest pain 08/13/2019   Leukocytosis 08/13/2019   Leg swelling 12/09/2018   Urinary frequency 12/09/2018   Pain in joint of right hip 01/21/2018   Myofascial pain syndrome, cervical 01/06/2018   Occipital neuralgia of right side 01/06/2018   RUQ abdominal pain    Gastritis and gastroduodenitis    Bile duct abnormality    Cholecystitis 04/30/2016   Gall bladder disease 04/28/2016   Sinus node dysfunction (HCC) 11/08/2015   Ejection fraction    Spinal  stenosis of cervical region 11/07/2014   Preoperative clearance 10/09/2014   Carotid artery disease (HCC)    S/P left TKA 07/27/2011   Dizzy spells 05/04/2011   Palpitations 01/21/2011   RHINITIS 07/21/2010   Hyperlipidemia associated with type 2 diabetes mellitus (HCC) 11/24/2008   OBESITY 11/24/2008   Coronary atherosclerosis 11/24/2008   VENTRICULAR HYPERTROPHY, LEFT 11/24/2008   HYPOTHYROIDISM, POSTSURGICAL 06/04/2008   Headache 06/04/2008   Insulin dependent type 2 diabetes mellitus (HCC) 03/23/2007   Esophageal reflux 03/23/2007   DIVERTICULOSIS OF COLON 03/23/2007   GENERALIZED OSTEOARTHROSIS UNSPECIFIED SITE 03/23/2007   OSA (obstructive sleep apnea) 02/16/2007   Hypertension associated with diabetes (HCC) 02/16/2007   COPD (chronic obstructive pulmonary disease) (HCC) 02/16/2007     Subjective:  Kayla [name] is a75 year old female with past medical history of cellulitis A1c 6.8, PAF on Eliquis, CKD stage IV, recurrent septic prepatellar bursitis status post washout in March 2023 or cultures grew MRSA treated with daptomycin x 6 weeks presents for hospital follow-up MRSA bacteremia secondary to CAD status post extraction likely seeded from 5. She was sent to [facility] ED at request of podiatry due to concern for possible osteo on x-ray. Found to have abscess with sinus tract to proximal phalanx on MRI. Concern for superimposed osteomyelitis. Metallic body on fourth and fifth metatarsal head. Taken to the OR for partial left first ray amputation with ID Dr. Loreta Ave. Patient states she had been on Doxy x 10 days prior to hospitalization. Or cultures no growth. TTE showed mobile mass on right atrial lead. TEE showed RV lead vegetation. Blood  cultures grew MRSA on 5/2. EP engaged and PPM extracted. ID engaged discharged on daptomycin x 6 weeks from extraction 5/7 EOT 10/20/2022. Patient had temp pacemaker at that.  Today 10/15/22: Pt  presents with her husband. No new complaints. Reports she  feels well. She had a fall this weekend, loss of step. Denies fevers, chills.   Review of Systems  All other systems reviewed and are negative.   Past Medical History:  Diagnosis Date   Allergy    Anxiety    Arthritis    CAD (coronary artery disease)    Mild per remote cath in 2006, /    Nuclear, January, 2013, no significant abnormality,   Carotid artery disease (HCC)    Doppler, January, 2013, 0 39% RIC A., 40-59% LICA, stable   Cataract    CHF (congestive heart failure) (HCC)    Chronic kidney disease    Complication of anesthesia    wakes up "mean"   COPD 02/16/2007   Intolerant of Spiriva, tudorza Intolerant of Trelegy    COPD (chronic obstructive pulmonary disease) (HCC)    Coronary atherosclerosis 11/24/2008   Catheterization 2006, nonobstructive coronary disease   //   nuclear 2013, low risk     Diabetes mellitus, type 2 (HCC)    Diverticula of colon    DIVERTICULOSIS OF COLON 03/23/2007   Qualifier: Diagnosis of  By: Craige Cotta MD, Vineet     Dizziness    occasional   Dizzy spells 05/04/2011   DM (diabetes mellitus) (HCC) 03/23/2007   Qualifier: Diagnosis of  By: Craige Cotta MD, Vineet     Dyspnea    On exertion   Ejection fraction    Essential hypertension 02/16/2007   Qualifier: Diagnosis of  By: Earlene Plater CMA, Tammy     G E R D 03/23/2007   Qualifier: Diagnosis of  By: Craige Cotta MD, Vineet     Gall bladder disease 04/28/2016   Gastritis and gastroduodenitis    GERD (gastroesophageal reflux disease)    Goiter    hx of   Headache(784.0)    migraine   Hyperlipidemia    Hypertension    Hypothyroidism    HYPOTHYROIDISM, POSTSURGICAL 06/04/2008   Qualifier: Diagnosis of  By: Everardo All MD, Sean A    Left ventricular hypertrophy    Leg edema    occasional swelling   Leg swelling 12/09/2018   Myofascial pain syndrome, cervical 01/06/2018   Obesity    OBESITY 11/24/2008   Qualifier: Diagnosis of  By: Vikki Ports     OBSTRUCTIVE SLEEP APNEA 02/16/2007   Auto CPAP 09/03/13 to  10/02/13 >> used on 30 of 30 nights with average 6 hrs and 3 min.  Average AHI is 3.1 with median CPAP 5 cm H2O and 95 th percentile CPAP 6 cm H2O.     Occipital neuralgia of right side 01/06/2018   OSA (obstructive sleep apnea)    cpap setting of 12   Pain in joint of right hip 01/21/2018   Palpitations 01/21/2011   48 hour Holter, 2015, scattered PACs and PVCs.    Paroxysmal atrial fibrillation (HCC)    Pneumonia    PONV (postoperative nausea and vomiting)    severe after every surgery   Pulmonary hypertension, secondary    RHINITIS 07/21/2010        RUQ abdominal pain    S/P left TKA 07/27/2011   Sinus node dysfunction (HCC) 11/08/2015   Sleep apnea    Spinal stenosis of cervical region 11/07/2014   Urinary  frequency 12/09/2018   UTI (urinary tract infection) 08/15/2019   per pt report   VENTRICULAR HYPERTROPHY, LEFT 11/24/2008   Qualifier: Diagnosis of  By: Vikki Ports      Outpatient Medications Prior to Visit  Medication Sig Dispense Refill   acetaminophen (TYLENOL) 500 MG tablet Take 1,000-1,500 mg by mouth every 8 (eight) hours as needed for mild pain, moderate pain or headache.     albuterol (PROVENTIL) (2.5 MG/3ML) 0.083% nebulizer solution Take 3 mLs (2.5 mg total) by nebulization every 2 (two) hours as needed for wheezing or shortness of breath. 75 mL 5   albuterol (VENTOLIN HFA) 108 (90 Base) MCG/ACT inhaler Inhale 2 puffs into the lungs every 6 (six) hours as needed for wheezing or shortness of breath. 3 each 3   Alcohol Swabs (B-D SINGLE USE SWABS REGULAR) PADS      allopurinol (ZYLOPRIM) 100 MG tablet Take 100 mg by mouth 2 (two) times daily.     amiodarone (PACERONE) 200 MG tablet Take 1 tablet (200 mg total) by mouth daily. 90 tablet 3   amLODipine (NORVASC) 5 MG tablet Take 2 tablets (10 mg total) by mouth daily.     ascorbic acid (VITAMIN C) 500 MG tablet Take 500 mg by mouth daily with lunch.     atorvastatin (LIPITOR) 40 MG tablet Take 1 tablet (40 mg  total) by mouth daily. (Patient taking differently: Take 40 mg by mouth at bedtime.) 90 tablet 3   Blood Glucose Calibration (TRUE METRIX LEVEL 1) Low SOLN      Budeson-Glycopyrrol-Formoterol (BREZTRI AEROSPHERE) 160-9-4.8 MCG/ACT AERO Inhale 2 puffs into the lungs in the morning and at bedtime. 3 each 10   Carboxymethylcellulose Sodium (ARTIFICIAL TEARS OP) Place 1 drop into both eyes daily as needed (dry eyes).     daptomycin (CUBICIN) IVPB Inject 600 mg into the vein every other day for 18 days. 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  First Dose: Yes Last Day of Therapy:  10/20/22 Labs - Once weekly:  CBC/D, BMP, and CPK Labs - Every other week:  ESR and CRP Method of administration: IV Push Method of administration may be changed at the discretion of home infusion pharmacist based upon assessment of the patient and/or caregiver's ability to self-administer the medication ordered. 9 Units 0   ELIQUIS 2.5 MG TABS tablet TAKE 1 TABLET TWICE DAILY 180 tablet 3   ferrous sulfate 325 (65 FE) MG tablet Take 1 tablet (325 mg total) by mouth daily with breakfast. 90 tablet 3   fluticasone (FLONASE) 50 MCG/ACT nasal spray USE 2 SPRAYS NASALLY DAILY (Patient taking differently: Place 1 spray into both nostrils daily as needed for allergies. USE 2 SPRAYS NASALLY DAILY) 48 g 1   gabapentin (NEURONTIN) 800 MG tablet Take 800 mg by mouth 3 (three) times daily.     guaiFENesin (ROBITUSSIN) 100 MG/5ML liquid Take 5 mLs by mouth every 4 (four) hours as needed for cough or to loosen phlegm.     HYDROcodone-acetaminophen (NORCO) 10-325 MG tablet Take 1 tablet by mouth every 6 (six) hours as needed. 10 tablet 0   HYDROXYZINE HCL PO Take 25 mg by mouth at bedtime as needed (insomnia).     insulin aspart (NOVOLOG) 100 UNIT/ML FlexPen Inject 0-20 Units into the skin See admin instructions. Inject 0-20 units into the skin three times a day before meals "as needed," per SLIDING SCALE     insulin glargine  (LANTUS) 100 UNIT/ML injection Inject 0.55 mLs (55 Units  total) into the skin 2 (two) times daily. (Patient taking differently: Inject 25-50 Units into the skin See admin instructions. Inject 25 units subcutaneously daily in the morning. Inject 50 units subcutaneously daily at bedtime.  Patient report 90 units in the morning 25 units at bedtime, was adamant regarding dosage.) 10 mL 11   levothyroxine (SYNTHROID) 200 MCG tablet Take 200 mcg by mouth daily before breakfast.     loratadine (CLARITIN) 10 MG tablet Take 1 tablet (10 mg total) by mouth daily.     losartan (COZAAR) 25 MG tablet Take 25 mg by mouth daily.     melatonin 5 MG TABS Take 1 tablet (5 mg total) by mouth at bedtime as needed.  0   nitroGLYCERIN (NITROSTAT) 0.4 MG SL tablet Place 1 tablet (0.4 mg total) under the tongue every 5 (five) minutes as needed for chest pain. 25 tablet 4   polyethylene glycol (MIRALAX / GLYCOLAX) 17 g packet Take 17 g by mouth daily as needed for mild constipation or moderate constipation.  0   PRESCRIPTION MEDICATION CPAP- At bedtime and as needed during any time of rest     sennosides-docusate sodium (SENOKOT-S) 8.6-50 MG tablet Take 1 tablet by mouth daily.     torsemide (DEMADEX) 20 MG tablet Take 2 tablets (40 mg total) by mouth daily.     TRUE METRIX BLOOD GLUCOSE TEST test strip      TRUEplus Lancets 28G MISC      vitamin B-12 (CYANOCOBALAMIN) 1000 MCG tablet Take 1,000 mcg by mouth daily with lunch.     Vitamin D3 (VITAMIN D) 25 MCG tablet Take 1,000 Units by mouth daily with lunch.     No facility-administered medications prior to visit.     Allergies  Allergen Reactions   Dulaglutide Nausea Only, Other (See Comments) and Cough    TRULICITY made the patient sick to her stomach (only) several days after using it   Oxycodone Itching and Other (See Comments)    Pt still takes this med even thought it causes itching   Trelegy Ellipta [Fluticasone-Umeclidin-Vilant] Nausea And Vomiting, Other  (See Comments) and Cough    Made the patient sick to her stomach (only) several days after using it     Social History   Tobacco Use   Smoking status: Former    Packs/day: 2.00    Years: 33.00    Additional pack years: 0.00    Total pack years: 66.00    Types: Cigarettes    Start date: 1966    Quit date: 05/04/1997    Years since quitting: 25.4   Smokeless tobacco: Never  Vaping Use   Vaping Use: Never used  Substance Use Topics   Alcohol use: No   Drug use: No    Family History  Problem Relation Age of Onset   Heart Problems Mother    Heart defect Mother        valve problems   Heart Problems Father    Heart disease Brother    Lung cancer Daughter        non small cell    Diabetes Maternal Aunt    Diabetes Maternal Uncle    Throat cancer Brother    Colon cancer Neg Hx    Colon polyps Neg Hx    Esophageal cancer Neg Hx    Rectal cancer Neg Hx    Stomach cancer Neg Hx     Objective:   Vitals:   10/15/22 0904  Pulse: 65  Resp: 16  Temp: 97.8 F (36.6 C)  TempSrc: Oral  SpO2: 99%   There is no height or weight on file to calculate BMI.  Physical Exam Constitutional:      Appearance: Normal appearance.  HENT:     Head: Normocephalic and atraumatic.     Nose: Nose normal.     Mouth/Throat:     Mouth: Mucous membranes are moist.  Eyes:     Extraocular Movements: Extraocular movements intact.     Conjunctiva/sclera: Conjunctivae normal.     Pupils: Pupils are equal, round, and reactive to light.  Cardiovascular:     Rate and Rhythm: Normal rate and regular rhythm.     Heart sounds: No murmur heard.    No friction rub. No gallop.  Pulmonary:     Effort: Pulmonary effort is normal.     Breath sounds: Normal breath sounds.  Abdominal:     General: Abdomen is flat.     Palpations: Abdomen is soft.  Skin:    General: Skin is warm and dry.  Neurological:     General: No focal deficit present.     Mental Status: She is alert and oriented to person,  place, and time.  Psychiatric:        Mood and Affect: Mood normal.     Lab Results: Lab Results  Component Value Date   WBC 11.9 (H) 10/07/2022   HGB 8.9 (L) 10/07/2022   HCT 28.0 (L) 10/07/2022   MCV 90.9 10/07/2022   PLT 453 (H) 10/07/2022    Lab Results  Component Value Date   CREATININE 2.10 (H) 10/07/2022   BUN 57 (H) 10/07/2022   NA 142 10/07/2022   K 4.2 10/07/2022   CL 104 10/07/2022   CO2 26 10/07/2022    Lab Results  Component Value Date   ALT 9 10/07/2022   AST 10 10/07/2022   ALKPHOS 61 10/01/2022   BILITOT 0.2 10/07/2022     Assessment & Plan:  #MRSA bacteremia 2/2 CIED likely seeded by foot #CIED infection SP extraction - Blood cultures on 5/2 grew 2/2 MRSA  -Pt had a heart monitor x 2 weeks. No temp PPM. Unclear if PPM needs to placed back, montor results pending.  -She plans to see Cards to see if she needs re-implantation of PPM - MRSA is Dapt sens - Conitnue dapto x 6 weeks EOT 6/18. Pull PICC after last dose. She has had mildly elevated wbc with esr/crp trending down. Given her multiple chronic conditions this could be multifactorial, including fall this weekend. Will get blood Cx to be complete - F/U in one month to do labs off of antibiotics      #Medication monitoring  -6/11 ck 102, esr 105, crp 10, scr 2.23(baseline), wbc 12.7 -6/5 esr >130, ck 9.6   #DFU is post first ray amputation on 5/3 noted that was no purulence or proximal tracking. -OR Cx NG -Wound is healed  #Diabetes mellitus - A1c 6.8 on 09/04/2022   #PAF on Eliquis - on eliquis -seen by cards on 10/06/22   #CKD stage IV -Followed by Nephrology   Danelle Earthly, MD [facility]   10/15/22  9:08 AM   I have personally spent 32 minutes involved in face-to-face and non-face-to-face activities for this patient on the day of the visit. Professional time spent includes the following activities: Preparing to see the  patient (review of tests), Obtaining and/or reviewing separately obtained history (admission/discharge  record), Performing a medically appropriate examination and/or evaluation , Ordering medications/tests/procedures, referring and communicating with other health care professionals, Documenting clinical information in the EMR, Independently interpreting results (not separately reported), Communicating results to the patient/family/caregiver, Counseling and educating the patient/family/caregiver and Care coordination (not separately reported).

## 2022-10-15 NOTE — Telephone Encounter (Signed)
Per Dr.Singh pull PICC line after last dose of IV abx on 10/20/2022. Message sent to Jeri Modena, RN/[facility] staff and [facility] pharmacists.    Kayla [name] Lesli Albee, CMA

## 2022-10-16 ENCOUNTER — Ambulatory Visit: Payer: Medicare HMO | Attending: Student | Admitting: Pulmonary Disease

## 2022-10-16 ENCOUNTER — Encounter: Payer: Self-pay | Admitting: Student

## 2022-10-16 VITALS — BP 133/50 | HR 71 | Ht 65.0 in

## 2022-10-16 DIAGNOSIS — I495 Sick sinus syndrome: Secondary | ICD-10-CM | POA: Diagnosis not present

## 2022-10-16 DIAGNOSIS — I5032 Chronic diastolic (congestive) heart failure: Secondary | ICD-10-CM

## 2022-10-16 DIAGNOSIS — R001 Bradycardia, unspecified: Secondary | ICD-10-CM | POA: Diagnosis not present

## 2022-10-16 DIAGNOSIS — I480 Paroxysmal atrial fibrillation: Secondary | ICD-10-CM

## 2022-10-16 LAB — CULTURE, BLOOD (SINGLE): MICRO NUMBER:: 15080541

## 2022-10-16 NOTE — Patient Instructions (Signed)
Medication Instructions:  1.Increase torsemide to 60 mg daily for today, tomorrow and Sunday *If you need a refill on your cardiac medications before your next appointment, please call your pharmacy*  Lab Work: None ordered If you have labs (blood work) drawn today and your tests are completely normal, you will receive your results only by: MyChart Message (if you have MyChart) OR A paper copy in the mail If you have any lab test that is abnormal or we need to change your treatment, we will call you to review the results.  Follow-Up: At [facility], you and your health needs are our priority.  As part of our continuing mission to provide you with exceptional heart care, we have created designated Provider Care Teams.  These Care Teams include your primary Cardiologist (physician) and Advanced Practice Providers (APPs -  Physician Assistants and Nurse Practitioners) who all work together to provide you with the care you need, when you need it.  We recommend signing up for the patient portal called "MyChart".  Sign up information is provided on this After Visit Summary.  MyChart is used to connect with patients for Virtual Visits (Telemedicine).  Patients are able to view lab/test results, encounter notes, upcoming appointments, etc.  Non-urgent messages can be sent to your provider as well.   To learn more about what you can do with MyChart, go to [email or web address].    Your next appointment:   3 month(s)  Provider:   Sherryl Manges, MD or [place] [facility]" Lanna Poche, PA-C   Call and schedule an appointment with your nephrologist   Other Instructions: Elevate your feet when sitting

## 2022-10-16 NOTE — Progress Notes (Signed)
Electrophysiology Office Note:   Date:  10/16/2022  ID:  Kayla [name], DOB 1940-07-27, MRN [id number]  Primary Cardiologist: Donato Schultz, MD Electrophysiologist: Sherryl Manges, MD      History of Present Illness:   Kayla [name] is a 82 y.o. female with h/o  obesity, DM II, HTN, HLD, CAD, CHF, PAF on Eliquis, SSS s/p PPM, CKD, Anemia, COPD, GERD, hypothyroidism, chronic venous insufficiency, s/p left first ray amputation (5/3) with active osteomyelitis seen today for routine electrophysiology followup.   She was recently admitted from 5/2-5/14 for CEID in the setting of MRSA bacteremia, osteomyelitis of her left foot. TEE was completed which showed thickening on the RV lead in the RA. Her PPM was extracted on 5/7 & temp perm placed. Her beta blockers were held. She had no further evidence of chrontropic incompetence/SSS at that time that warranted re-implantation. She had a ZIO patch mailed to her that was mailed back on 5/28. Beta blockers were held but she was on amiodarone.  She was discharged to SNF for rehab with plan for 6 weeks of IV daptomycin (started 09/08/22).      She was referred to the ER on 5/29 for clotted PICC line. At that time, she was noted to have sinus bradycardia 40-50's. After questioning, she had complaints of dizziness/lightheadedness with PT efforts while at the SNF. She was hemodynamically stable. She was monitored in hospital with no acute events and stable junctional rhythm.   Since last being seen in our clinic the patient reports doing well.  She is to finish her antibiotics 6/18 & PICC line is to be removed. Blood cultures 6/13 with NGTD. She reports she has been more swollen.  Drinks several big glasses of water per day due to dry mouth.  She denies chest pain, palpitations, dyspnea, PND, orthopnea, nausea, vomiting, dizziness, syncope, edema, weight gain, or early satiety.   Review of systems complete and found to be negative unless listed in HPI.    Studies  Reviewed:    EKG is not ordered today  EKG 5/30 > junctional bradycardia, rate 41/regular 14 D Monitor 09/29/22 > Patient had a min HR of 43 bpm, max HR of 121 bpm, and avg HR of 72 bpm.  14 Supraventricular Tachycardia runs occurred, the run with the fastest interval lasting 5 beats with a max rate of 121 bpm, the longest lasting 7 beats with an avg rate of 105 bpm. Isolated SVEs were frequent (5.1%, 73423), SVE Couplets were rare (<1.0%, 1090), and SVE Triplets were rare (<1.0%, 344). Isolated VEs were rare (<1.0%, 821), VE Couplets were rare (<1.0%, 21), and VE Triplets were rare (<1.0%, 6). Ventricular Trigeminy was present.  DEVICE HISTORY:  CEID s/p Extraction on 5/7. No device currently with active MRSA bacteremia.    Physical Exam:   VS:  There were no vitals taken for this visit.   Wt Readings from Last 3 Encounters:  10/06/22 223 lb 9.6 oz (101.4 kg)  10/01/22 221 lb 5.5 oz (100.4 kg)  09/13/22 234 lb 2.1 oz (106.2 kg)     GEN: Well nourished, well developed in no acute distress NECK: No JVD; No carotid bruits CARDIAC: Regular rate and rhythm, holosystolic murmur, rubs, gallops, 3+ BLE pretibial pitting edema RESPIRATORY:  Clear to auscultation without rales, wheezing or rhonchi  ABDOMEN: Soft, non-tender, non-distended EXTREMITIES:  No edema; No deformity   ASSESSMENT AND PLAN:    Bradycardia / Junctional Rhythm  SSS Medtronic PPM removed 5/7 secondary to CEID.  -  avoid beta blockers -no indication for PPM at this time   Chronic Diastolic CHF  Volume up on exam -increase Demadex to 60 mg QD for three days  -discussed fluid restriction  -follow up with Nephrology for diuretic review  / renal function  -pt intolerant of LE compression stockings   PAF -eliquis 2.5mg  BID  -stable on recent monitor  -continue amiodarone   Follow up with Dr. Graciela Husbands in 3 months  Signed, Canary Brim, MSN, APRN, NP-C, AGACNP-BC [facility] - Electrophysiology  10/16/2022,  8:04 AM

## 2022-10-18 DIAGNOSIS — R7881 Bacteremia: Secondary | ICD-10-CM | POA: Diagnosis not present

## 2022-10-20 DIAGNOSIS — E1151 Type 2 diabetes mellitus with diabetic peripheral angiopathy without gangrene: Secondary | ICD-10-CM | POA: Diagnosis not present

## 2022-10-20 DIAGNOSIS — E1169 Type 2 diabetes mellitus with other specified complication: Secondary | ICD-10-CM | POA: Diagnosis not present

## 2022-10-20 DIAGNOSIS — J449 Chronic obstructive pulmonary disease, unspecified: Secondary | ICD-10-CM | POA: Diagnosis not present

## 2022-10-20 DIAGNOSIS — I480 Paroxysmal atrial fibrillation: Secondary | ICD-10-CM | POA: Diagnosis not present

## 2022-10-20 DIAGNOSIS — E785 Hyperlipidemia, unspecified: Secondary | ICD-10-CM | POA: Diagnosis not present

## 2022-10-20 DIAGNOSIS — I872 Venous insufficiency (chronic) (peripheral): Secondary | ICD-10-CM | POA: Diagnosis not present

## 2022-10-20 DIAGNOSIS — I10 Essential (primary) hypertension: Secondary | ICD-10-CM | POA: Diagnosis not present

## 2022-10-20 DIAGNOSIS — E118 Type 2 diabetes mellitus with unspecified complications: Secondary | ICD-10-CM | POA: Diagnosis not present

## 2022-10-20 DIAGNOSIS — I519 Heart disease, unspecified: Secondary | ICD-10-CM | POA: Diagnosis not present

## 2022-10-20 DIAGNOSIS — J22 Unspecified acute lower respiratory infection: Secondary | ICD-10-CM | POA: Diagnosis not present

## 2022-10-20 DIAGNOSIS — M86172 Other acute osteomyelitis, left ankle and foot: Secondary | ICD-10-CM | POA: Diagnosis not present

## 2022-10-20 DIAGNOSIS — T827XXA Infection and inflammatory reaction due to other cardiac and vascular devices, implants and grafts, initial encounter: Secondary | ICD-10-CM | POA: Diagnosis not present

## 2022-10-20 DIAGNOSIS — E039 Hypothyroidism, unspecified: Secondary | ICD-10-CM | POA: Diagnosis not present

## 2022-10-20 DIAGNOSIS — B9562 Methicillin resistant Staphylococcus aureus infection as the cause of diseases classified elsewhere: Secondary | ICD-10-CM | POA: Diagnosis not present

## 2022-10-20 DIAGNOSIS — Z4781 Encounter for orthopedic aftercare following surgical amputation: Secondary | ICD-10-CM | POA: Diagnosis not present

## 2022-10-21 LAB — CULTURE, BLOOD (SINGLE): MICRO NUMBER:: 15080542

## 2022-10-23 DIAGNOSIS — I509 Heart failure, unspecified: Secondary | ICD-10-CM | POA: Diagnosis not present

## 2022-10-23 DIAGNOSIS — R0600 Dyspnea, unspecified: Secondary | ICD-10-CM | POA: Diagnosis not present

## 2022-10-23 DIAGNOSIS — R600 Localized edema: Secondary | ICD-10-CM | POA: Diagnosis not present

## 2022-10-25 ENCOUNTER — Inpatient Hospital Stay (HOSPITAL_COMMUNITY)
Admission: EM | Admit: 2022-10-25 | Discharge: 2022-11-14 | DRG: 871 | Disposition: A | Discharge status: Home Health | Payer: Medicare HMO | Attending: Internal Medicine | Admitting: Internal Medicine

## 2022-10-25 ENCOUNTER — Inpatient Hospital Stay (HOSPITAL_COMMUNITY): Payer: Medicare HMO

## 2022-10-25 ENCOUNTER — Other Ambulatory Visit: Payer: Self-pay

## 2022-10-25 ENCOUNTER — Emergency Department (HOSPITAL_COMMUNITY): Payer: Medicare HMO

## 2022-10-25 ENCOUNTER — Encounter (HOSPITAL_COMMUNITY): Payer: Self-pay | Admitting: Emergency Medicine

## 2022-10-25 DIAGNOSIS — I440 Atrioventricular block, first degree: Secondary | ICD-10-CM | POA: Diagnosis present

## 2022-10-25 DIAGNOSIS — N179 Acute kidney failure, unspecified: Secondary | ICD-10-CM | POA: Diagnosis not present

## 2022-10-25 DIAGNOSIS — E874 Mixed disorder of acid-base balance: Secondary | ICD-10-CM | POA: Diagnosis present

## 2022-10-25 DIAGNOSIS — N184 Chronic kidney disease, stage 4 (severe): Secondary | ICD-10-CM | POA: Diagnosis present

## 2022-10-25 DIAGNOSIS — Z888 Allergy status to other drugs, medicaments and biological substances status: Secondary | ICD-10-CM

## 2022-10-25 DIAGNOSIS — R001 Bradycardia, unspecified: Secondary | ICD-10-CM | POA: Diagnosis not present

## 2022-10-25 DIAGNOSIS — G9349 Other encephalopathy: Secondary | ICD-10-CM | POA: Diagnosis present

## 2022-10-25 DIAGNOSIS — Z6838 Body mass index (BMI) 38.0-38.9, adult: Secondary | ICD-10-CM

## 2022-10-25 DIAGNOSIS — R0689 Other abnormalities of breathing: Secondary | ICD-10-CM | POA: Diagnosis not present

## 2022-10-25 DIAGNOSIS — A419 Sepsis, unspecified organism: Secondary | ICD-10-CM | POA: Diagnosis not present

## 2022-10-25 DIAGNOSIS — I130 Hypertensive heart and chronic kidney disease with heart failure and stage 1 through stage 4 chronic kidney disease, or unspecified chronic kidney disease: Secondary | ICD-10-CM | POA: Diagnosis not present

## 2022-10-25 DIAGNOSIS — J9601 Acute respiratory failure with hypoxia: Secondary | ICD-10-CM | POA: Diagnosis present

## 2022-10-25 DIAGNOSIS — I499 Cardiac arrhythmia, unspecified: Secondary | ICD-10-CM | POA: Diagnosis not present

## 2022-10-25 DIAGNOSIS — J189 Pneumonia, unspecified organism: Secondary | ICD-10-CM | POA: Diagnosis not present

## 2022-10-25 DIAGNOSIS — J441 Chronic obstructive pulmonary disease with (acute) exacerbation: Secondary | ICD-10-CM | POA: Diagnosis present

## 2022-10-25 DIAGNOSIS — D649 Anemia, unspecified: Secondary | ICD-10-CM | POA: Diagnosis not present

## 2022-10-25 DIAGNOSIS — Z833 Family history of diabetes mellitus: Secondary | ICD-10-CM

## 2022-10-25 DIAGNOSIS — I272 Pulmonary hypertension, unspecified: Secondary | ICD-10-CM | POA: Diagnosis present

## 2022-10-25 DIAGNOSIS — Z9841 Cataract extraction status, right eye: Secondary | ICD-10-CM

## 2022-10-25 DIAGNOSIS — E875 Hyperkalemia: Secondary | ICD-10-CM | POA: Diagnosis not present

## 2022-10-25 DIAGNOSIS — E11649 Type 2 diabetes mellitus with hypoglycemia without coma: Secondary | ICD-10-CM | POA: Diagnosis not present

## 2022-10-25 DIAGNOSIS — J9690 Respiratory failure, unspecified, unspecified whether with hypoxia or hypercapnia: Secondary | ICD-10-CM | POA: Diagnosis not present

## 2022-10-25 DIAGNOSIS — R0602 Shortness of breath: Secondary | ICD-10-CM | POA: Diagnosis not present

## 2022-10-25 DIAGNOSIS — J9602 Acute respiratory failure with hypercapnia: Secondary | ICD-10-CM

## 2022-10-25 DIAGNOSIS — I509 Heart failure, unspecified: Secondary | ICD-10-CM | POA: Diagnosis not present

## 2022-10-25 DIAGNOSIS — I959 Hypotension, unspecified: Secondary | ICD-10-CM | POA: Diagnosis not present

## 2022-10-25 DIAGNOSIS — Y929 Unspecified place or not applicable: Secondary | ICD-10-CM

## 2022-10-25 DIAGNOSIS — Z79899 Other long term (current) drug therapy: Secondary | ICD-10-CM

## 2022-10-25 DIAGNOSIS — N189 Chronic kidney disease, unspecified: Secondary | ICD-10-CM

## 2022-10-25 DIAGNOSIS — J9811 Atelectasis: Secondary | ICD-10-CM | POA: Diagnosis not present

## 2022-10-25 DIAGNOSIS — Z79890 Hormone replacement therapy (postmenopausal): Secondary | ICD-10-CM

## 2022-10-25 DIAGNOSIS — R918 Other nonspecific abnormal finding of lung field: Secondary | ICD-10-CM | POA: Diagnosis not present

## 2022-10-25 DIAGNOSIS — Z89422 Acquired absence of other left toe(s): Secondary | ICD-10-CM

## 2022-10-25 DIAGNOSIS — R0603 Acute respiratory distress: Secondary | ICD-10-CM | POA: Diagnosis not present

## 2022-10-25 DIAGNOSIS — R0902 Hypoxemia: Secondary | ICD-10-CM | POA: Diagnosis not present

## 2022-10-25 DIAGNOSIS — F419 Anxiety disorder, unspecified: Secondary | ICD-10-CM | POA: Diagnosis present

## 2022-10-25 DIAGNOSIS — I2510 Atherosclerotic heart disease of native coronary artery without angina pectoris: Secondary | ICD-10-CM | POA: Diagnosis present

## 2022-10-25 DIAGNOSIS — Y92009 Unspecified place in unspecified non-institutional (private) residence as the place of occurrence of the external cause: Secondary | ICD-10-CM

## 2022-10-25 DIAGNOSIS — J440 Chronic obstructive pulmonary disease with acute lower respiratory infection: Secondary | ICD-10-CM | POA: Diagnosis present

## 2022-10-25 DIAGNOSIS — R0989 Other specified symptoms and signs involving the circulatory and respiratory systems: Secondary | ICD-10-CM | POA: Diagnosis not present

## 2022-10-25 DIAGNOSIS — Z7901 Long term (current) use of anticoagulants: Secondary | ICD-10-CM

## 2022-10-25 DIAGNOSIS — I5033 Acute on chronic diastolic (congestive) heart failure: Secondary | ICD-10-CM | POA: Diagnosis present

## 2022-10-25 DIAGNOSIS — Z950 Presence of cardiac pacemaker: Secondary | ICD-10-CM

## 2022-10-25 DIAGNOSIS — Z87891 Personal history of nicotine dependence: Secondary | ICD-10-CM

## 2022-10-25 DIAGNOSIS — E1142 Type 2 diabetes mellitus with diabetic polyneuropathy: Secondary | ICD-10-CM | POA: Diagnosis present

## 2022-10-25 DIAGNOSIS — Z515 Encounter for palliative care: Secondary | ICD-10-CM

## 2022-10-25 DIAGNOSIS — Z981 Arthrodesis status: Secondary | ICD-10-CM

## 2022-10-25 DIAGNOSIS — G9341 Metabolic encephalopathy: Secondary | ICD-10-CM | POA: Diagnosis not present

## 2022-10-25 DIAGNOSIS — R0609 Other forms of dyspnea: Secondary | ICD-10-CM | POA: Diagnosis not present

## 2022-10-25 DIAGNOSIS — J9621 Acute and chronic respiratory failure with hypoxia: Secondary | ICD-10-CM | POA: Diagnosis not present

## 2022-10-25 DIAGNOSIS — I480 Paroxysmal atrial fibrillation: Secondary | ICD-10-CM | POA: Diagnosis present

## 2022-10-25 DIAGNOSIS — J90 Pleural effusion, not elsewhere classified: Secondary | ICD-10-CM | POA: Diagnosis not present

## 2022-10-25 DIAGNOSIS — Z8249 Family history of ischemic heart disease and other diseases of the circulatory system: Secondary | ICD-10-CM

## 2022-10-25 DIAGNOSIS — E871 Hypo-osmolality and hyponatremia: Secondary | ICD-10-CM | POA: Diagnosis present

## 2022-10-25 DIAGNOSIS — Z66 Do not resuscitate: Secondary | ICD-10-CM | POA: Diagnosis not present

## 2022-10-25 DIAGNOSIS — M898X9 Other specified disorders of bone, unspecified site: Secondary | ICD-10-CM | POA: Diagnosis present

## 2022-10-25 DIAGNOSIS — E662 Morbid (severe) obesity with alveolar hypoventilation: Secondary | ICD-10-CM | POA: Diagnosis present

## 2022-10-25 DIAGNOSIS — S2232XA Fracture of one rib, left side, initial encounter for closed fracture: Secondary | ICD-10-CM | POA: Diagnosis not present

## 2022-10-25 DIAGNOSIS — Z4901 Encounter for fitting and adjustment of extracorporeal dialysis catheter: Secondary | ICD-10-CM | POA: Diagnosis not present

## 2022-10-25 DIAGNOSIS — W1830XA Fall on same level, unspecified, initial encounter: Secondary | ICD-10-CM | POA: Diagnosis present

## 2022-10-25 DIAGNOSIS — J984 Other disorders of lung: Secondary | ICD-10-CM | POA: Diagnosis not present

## 2022-10-25 DIAGNOSIS — Z9842 Cataract extraction status, left eye: Secondary | ICD-10-CM

## 2022-10-25 DIAGNOSIS — Z885 Allergy status to narcotic agent status: Secondary | ICD-10-CM

## 2022-10-25 DIAGNOSIS — K219 Gastro-esophageal reflux disease without esophagitis: Secondary | ICD-10-CM | POA: Diagnosis present

## 2022-10-25 DIAGNOSIS — Z96652 Presence of left artificial knee joint: Secondary | ICD-10-CM | POA: Diagnosis present

## 2022-10-25 DIAGNOSIS — Z961 Presence of intraocular lens: Secondary | ICD-10-CM | POA: Diagnosis present

## 2022-10-25 DIAGNOSIS — D631 Anemia in chronic kidney disease: Secondary | ICD-10-CM | POA: Diagnosis present

## 2022-10-25 DIAGNOSIS — R6889 Other general symptoms and signs: Secondary | ICD-10-CM | POA: Diagnosis not present

## 2022-10-25 DIAGNOSIS — E1122 Type 2 diabetes mellitus with diabetic chronic kidney disease: Secondary | ICD-10-CM | POA: Diagnosis present

## 2022-10-25 DIAGNOSIS — E785 Hyperlipidemia, unspecified: Secondary | ICD-10-CM | POA: Diagnosis present

## 2022-10-25 DIAGNOSIS — I701 Atherosclerosis of renal artery: Secondary | ICD-10-CM | POA: Diagnosis present

## 2022-10-25 DIAGNOSIS — Z794 Long term (current) use of insulin: Secondary | ICD-10-CM

## 2022-10-25 DIAGNOSIS — E1151 Type 2 diabetes mellitus with diabetic peripheral angiopathy without gangrene: Secondary | ICD-10-CM | POA: Diagnosis present

## 2022-10-25 DIAGNOSIS — J9622 Acute and chronic respiratory failure with hypercapnia: Secondary | ICD-10-CM | POA: Diagnosis present

## 2022-10-25 DIAGNOSIS — Z5320 Procedure and treatment not carried out because of patient's decision for unspecified reasons: Secondary | ICD-10-CM | POA: Diagnosis present

## 2022-10-25 LAB — RENAL FUNCTION PANEL
Albumin: 2.5 g/dL — ABNORMAL LOW (ref 3.5–5.0)
Anion gap: 14 (ref 5–15)
BUN: 106 mg/dL — ABNORMAL HIGH (ref 8–23)
CO2: 21 mmol/L — ABNORMAL LOW (ref 22–32)
Calcium: 8.5 mg/dL — ABNORMAL LOW (ref 8.9–10.3)
Chloride: 99 mmol/L (ref 98–111)
Creatinine, Ser: 4.45 mg/dL — ABNORMAL HIGH (ref 0.44–1.00)
GFR, Estimated: 9 mL/min — ABNORMAL LOW (ref >60)
Glucose, Bld: 164 mg/dL — ABNORMAL HIGH (ref 70–99)
Phosphorus: 5.8 mg/dL — ABNORMAL HIGH (ref 2.5–4.6)
Potassium: 6.6 mmol/L (ref 3.5–5.1)
Sodium: 134 mmol/L — ABNORMAL LOW (ref 135–145)

## 2022-10-25 LAB — I-STAT ARTERIAL BLOOD GAS, ED
Acid-base deficit: 6 mmol/L — ABNORMAL HIGH (ref 0.0–2.0)
Bicarbonate: 23.4 mmol/L (ref 20.0–28.0)
Calcium, Ion: 1.22 mmol/L (ref 1.15–1.40)
HCT: 27 % — ABNORMAL LOW (ref 36.0–46.0)
Hemoglobin: 9.2 g/dL — ABNORMAL LOW (ref 12.0–15.0)
O2 Saturation: 100 %
Patient temperature: 35.6
Potassium: 6.6 mmol/L (ref 3.5–5.1)
Sodium: 133 mmol/L — ABNORMAL LOW (ref 135–145)
TCO2: 25 mmol/L (ref 22–32)
pCO2 arterial: 63.8 mmHg — ABNORMAL HIGH (ref 32–48)
pH, Arterial: 7.165 — CL (ref 7.35–7.45)
pO2, Arterial: 371 mmHg — ABNORMAL HIGH (ref 83–108)

## 2022-10-25 LAB — I-STAT VENOUS BLOOD GAS, ED
Acid-base deficit: 5 mmol/L — ABNORMAL HIGH (ref 0.0–2.0)
Bicarbonate: 22 mmol/L (ref 20.0–28.0)
Calcium, Ion: 1.13 mmol/L — ABNORMAL LOW (ref 1.15–1.40)
HCT: 27 % — ABNORMAL LOW (ref 36.0–46.0)
Hemoglobin: 9.2 g/dL — ABNORMAL LOW (ref 12.0–15.0)
O2 Saturation: 73 %
Potassium: 6.6 mmol/L (ref 3.5–5.1)
Sodium: 133 mmol/L — ABNORMAL LOW (ref 135–145)
TCO2: 23 mmol/L (ref 22–32)
pCO2, Ven: 51.2 mmHg (ref 44–60)
pH, Ven: 7.24 — ABNORMAL LOW (ref 7.25–7.43)
pO2, Ven: 46 mmHg — ABNORMAL HIGH (ref 32–45)

## 2022-10-25 LAB — CBC WITH DIFFERENTIAL/PLATELET
Abs Immature Granulocytes: 0.18 10*3/uL — ABNORMAL HIGH (ref 0.00–0.07)
Basophils Absolute: 0 10*3/uL (ref 0.0–0.1)
Basophils Relative: 0 %
Eosinophils Absolute: 0.1 10*3/uL (ref 0.0–0.5)
Eosinophils Relative: 1 %
HCT: 28.6 % — ABNORMAL LOW (ref 36.0–46.0)
Hemoglobin: 8.4 g/dL — ABNORMAL LOW (ref 12.0–15.0)
Immature Granulocytes: 1 %
Lymphocytes Relative: 8 %
Lymphs Abs: 1.4 10*3/uL (ref 0.7–4.0)
MCH: 28.6 pg (ref 26.0–34.0)
MCHC: 29.4 g/dL — ABNORMAL LOW (ref 30.0–36.0)
MCV: 97.3 fL (ref 80.0–100.0)
Monocytes Absolute: 1.3 10*3/uL — ABNORMAL HIGH (ref 0.1–1.0)
Monocytes Relative: 7 %
Neutro Abs: 15.2 10*3/uL — ABNORMAL HIGH (ref 1.7–7.7)
Neutrophils Relative %: 83 %
Platelets: 478 10*3/uL — ABNORMAL HIGH (ref 150–400)
RBC: 2.94 MIL/uL — ABNORMAL LOW (ref 3.87–5.11)
RDW: 16.6 % — ABNORMAL HIGH (ref 11.5–15.5)
WBC: 18.3 10*3/uL — ABNORMAL HIGH (ref 4.0–10.5)
nRBC: 0.2 % (ref 0.0–0.2)

## 2022-10-25 LAB — BASIC METABOLIC PANEL
Anion gap: 16 — ABNORMAL HIGH (ref 5–15)
BUN: 120 mg/dL — ABNORMAL HIGH (ref 8–23)
CO2: 19 mmol/L — ABNORMAL LOW (ref 22–32)
Calcium: 8.9 mg/dL (ref 8.9–10.3)
Chloride: 99 mmol/L (ref 98–111)
Creatinine, Ser: 5.06 mg/dL — ABNORMAL HIGH (ref 0.44–1.00)
GFR, Estimated: 8 mL/min — ABNORMAL LOW (ref >60)
Glucose, Bld: 207 mg/dL — ABNORMAL HIGH (ref 70–99)
Potassium: 6.6 mmol/L (ref 3.5–5.1)
Sodium: 134 mmol/L — ABNORMAL LOW (ref 135–145)

## 2022-10-25 LAB — POCT I-STAT 7, (LYTES, BLD GAS, ICA,H+H)
Acid-base deficit: 5 mmol/L — ABNORMAL HIGH (ref 0.0–2.0)
Bicarbonate: 23 mmol/L (ref 20.0–28.0)
Calcium, Ion: 1.2 mmol/L (ref 1.15–1.40)
HCT: 27 % — ABNORMAL LOW (ref 36.0–46.0)
Hemoglobin: 9.2 g/dL — ABNORMAL LOW (ref 12.0–15.0)
O2 Saturation: 94 %
Patient temperature: 96
Potassium: 6.5 mmol/L (ref 3.5–5.1)
Sodium: 134 mmol/L — ABNORMAL LOW (ref 135–145)
TCO2: 25 mmol/L (ref 22–32)
pCO2 arterial: 52.8 mmHg — ABNORMAL HIGH (ref 32–48)
pH, Arterial: 7.239 — ABNORMAL LOW (ref 7.35–7.45)
pO2, Arterial: 80 mmHg — ABNORMAL LOW (ref 83–108)

## 2022-10-25 LAB — I-STAT CHEM 8, ED
BUN: >130 mg/dL — ABNORMAL HIGH (ref 8–23)
Calcium, Ion: 1.16 mmol/L (ref 1.15–1.40)
Chloride: 105 mmol/L (ref 98–111)
Creatinine, Ser: 5.4 mg/dL — ABNORMAL HIGH (ref 0.44–1.00)
Glucose, Bld: 198 mg/dL — ABNORMAL HIGH (ref 70–99)
HCT: 29 % — ABNORMAL LOW (ref 36.0–46.0)
Hemoglobin: 9.9 g/dL — ABNORMAL LOW (ref 12.0–15.0)
Potassium: 6.5 mmol/L (ref 3.5–5.1)
Sodium: 133 mmol/L — ABNORMAL LOW (ref 135–145)
TCO2: 21 mmol/L — ABNORMAL LOW (ref 22–32)

## 2022-10-25 LAB — ECHOCARDIOGRAM LIMITED
Height: 65 in
S' Lateral: 3.1 cm
Weight: 3680 oz

## 2022-10-25 LAB — GLUCOSE, CAPILLARY
Glucose-Capillary: 144 mg/dL — ABNORMAL HIGH (ref 70–99)
Glucose-Capillary: 159 mg/dL — ABNORMAL HIGH (ref 70–99)
Glucose-Capillary: 46 mg/dL — ABNORMAL LOW (ref 70–99)
Glucose-Capillary: 84 mg/dL (ref 70–99)

## 2022-10-25 LAB — LACTIC ACID, PLASMA: Lactic Acid, Venous: 0.6 mmol/L (ref 0.5–1.9)

## 2022-10-25 LAB — TROPONIN I (HIGH SENSITIVITY)
Troponin I (High Sensitivity): 33 ng/L — ABNORMAL HIGH (ref <18)
Troponin I (High Sensitivity): 38 ng/L — ABNORMAL HIGH (ref <18)

## 2022-10-25 LAB — MRSA NEXT GEN BY PCR, NASAL: MRSA by PCR Next Gen: NOT DETECTED

## 2022-10-25 LAB — PROCALCITONIN: Procalcitonin: 0.16 ng/mL

## 2022-10-25 LAB — BRAIN NATRIURETIC PEPTIDE: B Natriuretic Peptide: 819.8 pg/mL — ABNORMAL HIGH (ref 0.0–100.0)

## 2022-10-25 LAB — CBG MONITORING, ED: Glucose-Capillary: 202 mg/dL — ABNORMAL HIGH (ref 70–99)

## 2022-10-25 MED ORDER — SODIUM CHLORIDE 0.9 % IV SOLN: INTRAVENOUS | Status: DC | PRN

## 2022-10-25 MED ORDER — PRISMASOL BGK 0/2.5 32-2.5 MEQ/L EC SOLN
Status: DC
  Filled 2022-10-25 (×7): qty 5000

## 2022-10-25 MED ORDER — PRISMASOL BGK 0/2.5 32-2.5 MEQ/L EC SOLN
Status: DC
  Filled 2022-10-25 (×24): qty 5000

## 2022-10-25 MED ORDER — PIPERACILLIN-TAZOBACTAM 3.375 G IVPB 30 MIN
3.3750 g | Freq: Four times a day (QID) | INTRAVENOUS | Status: DC
  Administered 2022-10-25 – 2022-10-27 (×8): 3.375 g via INTRAVENOUS
  Filled 2022-10-25 (×16): qty 50

## 2022-10-25 MED ORDER — VANCOMYCIN HCL IN DEXTROSE 1-5 GM/200ML-% IV SOLN: 1000.0000 mg | INTRAVENOUS | Status: DC

## 2022-10-25 MED ORDER — SODIUM CHLORIDE 0.9 % IV SOLN
500.0000 [IU]/h | INTRAVENOUS | Status: DC
  Administered 2022-10-25 – 2022-10-26 (×2): 500 [IU]/h via INTRAVENOUS_CENTRAL
  Filled 2022-10-25 (×2): qty 10000

## 2022-10-25 MED ORDER — ALBUTEROL SULFATE (2.5 MG/3ML) 0.083% IN NEBU
2.5000 mg | INHALATION_SOLUTION | RESPIRATORY_TRACT | Status: DC | PRN
  Administered 2022-10-27 – 2022-11-13 (×24): 2.5 mg via RESPIRATORY_TRACT
  Filled 2022-10-25 (×25): qty 3

## 2022-10-25 MED ORDER — HEPARIN SODIUM (PORCINE) 1000 UNIT/ML DIALYSIS
1000.0000 [IU] | INTRAMUSCULAR | Status: DC | PRN
  Administered 2022-10-26: 2400 [IU] via INTRAVENOUS_CENTRAL
  Filled 2022-10-25: qty 6
  Filled 2022-10-25: qty 3

## 2022-10-25 MED ORDER — INSULIN ASPART 100 UNIT/ML IJ SOLN
0.0000 [IU] | INTRAMUSCULAR | Status: DC
  Administered 2022-10-25: 2 [IU] via SUBCUTANEOUS
  Administered 2022-10-25 – 2022-10-26 (×2): 3 [IU] via SUBCUTANEOUS
  Administered 2022-10-27: 2 [IU] via SUBCUTANEOUS

## 2022-10-25 MED ORDER — CHLORHEXIDINE GLUCONATE CLOTH 2 % EX PADS
6.0000 | MEDICATED_PAD | Freq: Every day | CUTANEOUS | Status: DC
  Administered 2022-10-25 – 2022-10-29 (×5): 6 via TOPICAL

## 2022-10-25 MED ORDER — SODIUM CHLORIDE 0.9 % IV SOLN
2.0000 g | Freq: Once | INTRAVENOUS | Status: AC
  Administered 2022-10-25: 2 g via INTRAVENOUS
  Filled 2022-10-25: qty 12.5

## 2022-10-25 MED ORDER — DEXTROSE 50 % IV SOLN
1.0000 | Freq: Once | INTRAVENOUS | Status: AC
  Administered 2022-10-25: 50 mL via INTRAVENOUS
  Filled 2022-10-25: qty 50

## 2022-10-25 MED ORDER — SODIUM CHLORIDE 0.9 % IV SOLN: 2.0000 g | INTRAVENOUS | Status: DC

## 2022-10-25 MED ORDER — DOPAMINE-DEXTROSE 3.2-5 MG/ML-% IV SOLN
0.0000 ug/kg/min | INTRAVENOUS | Status: DC
  Administered 2022-10-25 – 2022-10-26 (×2): 5 ug/kg/min via INTRAVENOUS
  Administered 2022-10-26: 2.5 ug/kg/min via INTRAVENOUS
  Filled 2022-10-25 (×2): qty 250

## 2022-10-25 MED ORDER — SODIUM BICARBONATE 8.4 % IV SOLN
50.0000 meq | Freq: Once | INTRAVENOUS | Status: AC
  Administered 2022-10-25: 50 meq via INTRAVENOUS
  Filled 2022-10-25: qty 50

## 2022-10-25 MED ORDER — DEXTROSE 50 % IV SOLN
INTRAVENOUS | Status: AC
  Filled 2022-10-25: qty 50

## 2022-10-25 MED ORDER — ACETAMINOPHEN 325 MG PO TABS
650.0000 mg | ORAL_TABLET | Freq: Four times a day (QID) | ORAL | Status: DC | PRN
  Administered 2022-10-26 – 2022-11-04 (×4): 650 mg via ORAL
  Filled 2022-10-25 (×4): qty 2

## 2022-10-25 MED ORDER — VANCOMYCIN VARIABLE DOSE PER UNSTABLE RENAL FUNCTION (PHARMACIST DOSING): Status: DC

## 2022-10-25 MED ORDER — SODIUM BICARBONATE 8.4 % IV SOLN
INTRAVENOUS | Status: AC
  Administered 2022-10-25: 100 meq via INTRAVENOUS
  Filled 2022-10-25: qty 100

## 2022-10-25 MED ORDER — ORAL CARE MOUTH RINSE
15.0000 mL | OROMUCOSAL | Status: DC
  Administered 2022-10-26 – 2022-11-13 (×55): 15 mL via OROMUCOSAL

## 2022-10-25 MED ORDER — PRISMASOL BGK 4/2.5 32-4-2.5 MEQ/L EC SOLN: Status: DC

## 2022-10-25 MED ORDER — DEXTROSE 50 % IV SOLN
25.0000 g | Freq: Once | INTRAVENOUS | Status: AC
  Administered 2022-10-25: 25 g via INTRAVENOUS

## 2022-10-25 MED ORDER — FUROSEMIDE 10 MG/ML IJ SOLN
80.0000 mg | Freq: Once | INTRAMUSCULAR | Status: AC
  Administered 2022-10-25: 80 mg via INTRAVENOUS
  Filled 2022-10-25: qty 8

## 2022-10-25 MED ORDER — SODIUM CHLORIDE 0.9% FLUSH
3.0000 mL | Freq: Two times a day (BID) | INTRAVENOUS | Status: DC
  Administered 2022-10-25 – 2022-11-13 (×38): 3 mL via INTRAVENOUS

## 2022-10-25 MED ORDER — CALCIUM GLUCONATE 10 % IV SOLN
1.0000 g | Freq: Once | INTRAVENOUS | Status: AC
  Administered 2022-10-25: 1 g via INTRAVENOUS
  Filled 2022-10-25: qty 10

## 2022-10-25 MED ORDER — INSULIN ASPART 100 UNIT/ML IV SOLN
5.0000 [IU] | Freq: Once | INTRAVENOUS | Status: AC
  Administered 2022-10-25: 5 [IU] via INTRAVENOUS

## 2022-10-25 MED ORDER — SODIUM BICARBONATE 8.4 % IV SOLN: 100.0000 meq | Freq: Once | INTRAVENOUS | Status: AC

## 2022-10-25 MED ORDER — ORAL CARE MOUTH RINSE: 15.0000 mL | OROMUCOSAL | Status: DC | PRN

## 2022-10-25 MED ORDER — ACETAMINOPHEN 650 MG RE SUPP: 650.0000 mg | Freq: Four times a day (QID) | RECTAL | Status: DC | PRN

## 2022-10-25 MED ORDER — VANCOMYCIN HCL 2000 MG/400ML IV SOLN
2000.0000 mg | Freq: Once | INTRAVENOUS | Status: AC
  Administered 2022-10-25: 2000 mg via INTRAVENOUS
  Filled 2022-10-25: qty 400

## 2022-10-25 NOTE — Consult Note (Addendum)
Initial Consultation Note   Patient: Kayla [name]:[id number] DOB: 07/31/1940 PCP: Ailene Ravel, MD DOA: 10/25/2022 DOS: the patient was seen and examined on 10/25/2022 Primary service: Cheri Fowler, MD  Referring physician: Kristine Royal, MD Reason for consult: Respiratory distress  Assessment/Plan: Assessment and Plan: Acute on chronic respiratory failure with hypercapnia and hypoxia Acute on chronic diastolic congestive heart failure exacerbation Patient noted to have concern for respiratory distress this morning and had been temporarily received bag mask respirations.  Venous blood gas noted pH of 7.165, pCO2 63.8, pO2 of 371.  Patient has been placed on BiPAP.  Patient with at least 3+ pitting bilateral lower extremity edema.  Chest x-ray concerning for diffuse pleural effusions.  BNP 819.8.  Patient has been given Lasix 80 mg IV 1 dose. -Recommended PCCM evaluation due to patient being critical and needing CRRT -Strict I&O's and daily weights -Continue BiPAP -   Possibly Sepsis secondary to community-acquired pneumonia -Check procalcitonin -Check sputum cultures -Continue antibiotics  Hyperkalemia Acute renal failure superimposed on chronic kidney disease stage IV Patient presents with creatinine of 5.06 with BUN 120.  Baseline creatinine previously have been around 2.  Patient followed in outpatient setting by [facility].  Patient has been treated with temporizing measures while in the ED. -Nephrology formally consulted, and recommended to start CRRT  Bradycardia Heart rates reported to be in the 30s for which patient had been transcutaneously placed with EMS prior to arrival.  Blood pressures had reportedly been stable.  Question if the low heart rate is leading to poor perfusion of the kidneys and heart failure  -Cardiology consulted, follow-up for any further recommendations      TRH will sign off at present, please call us again when  needed.  HPI: Kayla [name] is a 82 y.o. female with past medical history of   hypertension, hyperlipidemia, CAD, CHF, paroxysmal atrial fibrillation on Eliquis, SSS s/p PM with  carotid artery disease, COPD, diabetes mellitus type 2 with polyneuropathy, anemia of chronic disease, hypothyroidism, and OSA presented for respiratory distress.  Patient had last been hospitalized 5/2-5/14 for osteomyelitis of the left foot status post left knee partial ray amputation on 5/3.  Hospital course complicated by MRSA bacteremia with PM lead infection requiring extraction on 5/7.  Patient completed 6 weeks of IV daptomycin on 6/18.  Husband notes that over the last week she has been coughing more than usual.  He had taken her to the urgent care in [place] 5 days ago and they were concerned for possible pneumonia which she was started on antibiotics.  Patient noted increased lower extremity swelling, decreased urine output, and husband states that she had been shaking/jerking more.  She had been seen by her primary care provider 2 days ago and started on medication to try and help her urinate.  It had been discussed and patient was okay with being on dialysis if needed.   In route with EMS patient was reported to be breathing only 4 times a minute for which she was bagged and then noted to have heart rates in the 30s for which EMS began pacing her.     In the emergency department patient was noted to be hypothermic with temperature 94.2, pulse 3238, respirations 13-26, blood pressures 120/52-161/58, and O2 saturation currently maintained on BiPAP.  Labs significant for WBC 18.3, hemoglobin 8.4, platelets 478, sodium 134, potassium 6.6, CO2 19, BUN 120, creatinine 5.06, BNP 819.5, high-sensitivity troponin 33, lactic acid 0.6.  Chest  x-ray noted bilateral bibasilar airspace disease that was progressed concerning for edema versus infection with bilateral pleural effusions.  Patient had initially been given Lasix 80 mg IV,  1 amp of sodium bicarb, insulin, amp of D50, vancomycin, and cefepime.  Nephrology cardiology had been consulted.    Review of Systems: unable to review all systems due to the inability of the patient to answer questions. Past Medical History:  Diagnosis Date   Allergy    Anxiety    Arthritis    CAD (coronary artery disease)    Mild per remote cath in 2006, /    Nuclear, January, 2013, no significant abnormality,   Carotid artery disease (HCC)    Doppler, January, 2013, 0 39% RIC A., 40-59% LICA, stable   Cataract    CHF (congestive heart failure) (HCC)    Chronic kidney disease    Complication of anesthesia    wakes up "mean"   COPD 02/16/2007   Intolerant of Spiriva, tudorza Intolerant of Trelegy    COPD (chronic obstructive pulmonary disease) (HCC)    Coronary atherosclerosis 11/24/2008   Catheterization 2006, nonobstructive coronary disease   //   nuclear 2013, low risk     Diabetes mellitus, type 2 (HCC)    Diverticula of colon    DIVERTICULOSIS OF COLON 03/23/2007   Qualifier: Diagnosis of  By: Craige Cotta MD, Vineet     Dizziness    occasional   Dizzy spells 05/04/2011   DM (diabetes mellitus) (HCC) 03/23/2007   Qualifier: Diagnosis of  By: Craige Cotta MD, Vineet     Dyspnea    On exertion   Ejection fraction    Essential hypertension 02/16/2007   Qualifier: Diagnosis of  By: Earlene Plater CMA, Tammy     G E R D 03/23/2007   Qualifier: Diagnosis of  By: Craige Cotta MD, Vineet     Gall bladder disease 04/28/2016   Gastritis and gastroduodenitis    GERD (gastroesophageal reflux disease)    Goiter    hx of   Headache(784.0)    migraine   Hyperlipidemia    Hypertension    Hypothyroidism    HYPOTHYROIDISM, POSTSURGICAL 06/04/2008   Qualifier: Diagnosis of  By: Everardo All MD, Sean A    Left ventricular hypertrophy    Leg edema    occasional swelling   Leg swelling 12/09/2018   Myofascial pain syndrome, cervical 01/06/2018   Obesity    OBESITY 11/24/2008   Qualifier: Diagnosis of  By:  Vikki Ports     OBSTRUCTIVE SLEEP APNEA 02/16/2007   Auto CPAP 09/03/13 to 10/02/13 >> used on 30 of 30 nights with average 6 hrs and 3 min.  Average AHI is 3.1 with median CPAP 5 cm H2O and 95 th percentile CPAP 6 cm H2O.     Occipital neuralgia of right side 01/06/2018   OSA (obstructive sleep apnea)    cpap setting of 12   Pain in joint of right hip 01/21/2018   Palpitations 01/21/2011   48 hour Holter, 2015, scattered PACs and PVCs.    Paroxysmal atrial fibrillation (HCC)    Pneumonia    PONV (postoperative nausea and vomiting)    severe after every surgery   Pulmonary hypertension, secondary    RHINITIS 07/21/2010        RUQ abdominal pain    S/P left TKA 07/27/2011   Sinus node dysfunction (HCC) 11/08/2015   Sleep apnea    Spinal stenosis of cervical region 11/07/2014   Urinary frequency 12/09/2018   UTI (  urinary tract infection) 08/15/2019   per pt report   VENTRICULAR HYPERTROPHY, LEFT 11/24/2008   Qualifier: Diagnosis of  By: Vikki Ports     Past Surgical History:  Procedure Laterality Date   ABDOMINAL HYSTERECTOMY     with appendectomy and colon polypectomy   AMPUTATION TOE Left 12/15/2019   Procedure: AMPUTATION  LEFT GREAT TOE;  Surgeon: Park Liter, DPM;  Location: [facility];  Service: Podiatry;  Laterality: Left;   AMPUTATION TOE Left 01/22/2022   Procedure: AMPUTATION TOE 4th digit;  Surgeon: Candelaria Stagers, DPM;  Location: [facility];  Service: Podiatry;  Laterality: Left;   AMPUTATION TOE Left 09/04/2022   Procedure: LEFT BIG TOE AMPUTAION;  Surgeon: Vivi Barrack, DPM;  Location: [facility];  Service: Podiatry;  Laterality: Left;   ANTERIOR CERVICAL DECOMP/DISCECTOMY FUSION N/A 11/07/2014   Procedure: Anterior Cervical diskectomy and fusion Cervical four-five, Cervical five-six, Cervical six-seven;  Surgeon: Donalee Citrin, MD;  Location: [facility];  Service: Neurosurgery;  Laterality: N/A;   APPENDECTOMY     ARTERY BIOPSY Right 02/16/2017   Procedure: BIOPSY  TEMPORAL ARTERY;  Surgeon: Renford Dills, MD;  Location: [facility];  Service: Vascular;  Laterality: Right;   ARTERY BIOPSY Left 04/16/2020   Procedure: BIOPSY TEMPORAL ARTERY;  Surgeon: Renford Dills, MD;  Location: [facility];  Service: Vascular;  Laterality: Left;  LEFT TEMPLE   BACK SURGERY     CARDIAC CATHETERIZATION     CATARACT EXTRACTION W/ INTRAOCULAR LENS  IMPLANT, BILATERAL Bilateral    CHOLECYSTECTOMY N/A 04/29/2016   Procedure: LAPAROSCOPIC CHOLECYSTECTOMY WITH INTRAOPERATIVE CHOLANGIOGRAM;  Surgeon: Ovidio Kin, MD;  Location: [facility];  Service: General;  Laterality: N/A;   EP IMPLANTABLE DEVICE N/A 11/08/2015   MRI COMPATABLE -- Procedure: Pacemaker Implant;  Surgeon: Duke Salvia, MD;  Location: [facility];  Service: Cardiovascular;  Laterality: N/A;   INCISION AND DRAINAGE ABSCESS Right 05/30/2021   Procedure: IRRIGATION AND DEBRIDEMENT OF PREPATELLA BURSA;  Surgeon: Durene Romans, MD;  Location: [facility];  Service: Orthopedics;  Laterality: Right;  60 wants standard knee draping and pulse lavage   INCISION AND DRAINAGE ABSCESS Right 07/22/2021   Procedure: INCISION AND DRAINAGE ABSCESS RIGHT KNEE;  Surgeon: Durene Romans, MD;  Location: [facility];  Service: Orthopedics;  Laterality: Right;   INSERT / REPLACE / REMOVE PACEMAKER  2017   Dr. Clide Cliff [facility])   IR FLUORO GUIDE CV LINE RIGHT  07/25/2021   IR REMOVAL TUN CV CATH W/O FL  09/04/2021   IR US GUIDE VASC ACCESS RIGHT  07/25/2021   JOINT REPLACEMENT     LEAD EXTRACTION N/A 09/08/2022   Procedure: LEAD EXTRACTION;  Surgeon: Marinus Maw, MD;  [facility];  Service: Cardiovascular;  Laterality: N/A;   LEAD EXTRACTION N/A 09/11/2022   Procedure: LEAD EXTRACTION;  Surgeon: Marinus Maw, MD;  [facility]: [facility];  Service: Cardiovascular;  Laterality: N/A;   LUMBAR DISC SURGERY  1990's   x 2   TEE WITHOUT CARDIOVERSION N/A 09/07/2022   Procedure: TRANSESOPHAGEAL ECHOCARDIOGRAM;  Surgeon:  Christell Constant, MD;  [facility]: [facility];  Service: Cardiovascular;  Laterality: N/A;   THYROIDECTOMY  2011   TOTAL KNEE ARTHROPLASTY  07/27/2011   Procedure: TOTAL KNEE ARTHROPLASTY;  Surgeon: Shelda Pal, MD;  Location: [facility];  Service: Orthopedics;  Laterality: Left;   UPPER ESOPHAGEAL ENDOSCOPIC ULTRASOUND (EUS) N/A 05/14/2016   Procedure: UPPER ESOPHAGEAL ENDOSCOPIC ULTRASOUND (EUS);  Surgeon: Rachael Fee, MD;  Location: [facility];  Service: Endoscopy;  Laterality: N/A;  radial only-will be done mod if needed   Social History:  reports that she quit smoking about 25 years ago. Her smoking use included cigarettes. She started smoking about 58 years ago. She has a 66.00 pack-year smoking history. She has never used smokeless tobacco. She reports that she does not drink alcohol and does not use drugs.  Allergies  Allergen Reactions   Dulaglutide Nausea Only, Other (See Comments) and Cough    TRULICITY made the patient sick to her stomach (only) several days after using it   Oxycodone Itching and Other (See Comments)    Pt still takes this med even thought it causes itching   Trelegy Ellipta [Fluticasone-Umeclidin-Vilant] Nausea And Vomiting, Other (See Comments) and Cough    Made the patient sick to her stomach (only) several days after using it     Family History  Problem Relation Age of Onset   Heart Problems Mother    Heart defect Mother        valve problems   Heart Problems Father    Heart disease Brother    Lung cancer Daughter        non small cell    Diabetes Maternal Aunt    Diabetes Maternal Uncle    Throat cancer Brother    Colon cancer Neg Hx    Colon polyps Neg Hx    Esophageal cancer Neg Hx    Rectal cancer Neg Hx    Stomach cancer Neg Hx     Prior to Admission medications   Medication Sig Start Date End Date Taking? Authorizing Provider  acetaminophen (TYLENOL) 500 MG tablet Take 1,000-1,500 mg by mouth every 8 (eight) hours as  needed for mild pain, moderate pain or headache.    [provider]  albuterol (PROVENTIL) (2.5 MG/3ML) 0.083% nebulizer solution Take 3 mLs (2.5 mg total) by nebulization every 2 (two) hours as needed for wheezing or shortness of breath. 07/03/22   Parrett, Virgel Bouquet, NP  albuterol (VENTOLIN HFA) 108 (90 Base) MCG/ACT inhaler Inhale 2 puffs into the lungs every 6 (six) hours as needed for wheezing or shortness of breath. 07/03/22   Parrett, Virgel Bouquet, NP  Alcohol Swabs (B-D SINGLE USE SWABS REGULAR) PADS  11/24/19   [provider]  allopurinol (ZYLOPRIM) 100 MG tablet Take 100 mg by mouth 2 (two) times daily. 04/16/19   [provider]  amiodarone (PACERONE) 200 MG tablet Take 1 tablet (200 mg total) by mouth daily. 05/22/22   Jake Bathe, MD  amLODipine (NORVASC) 5 MG tablet Take 2 tablets (10 mg total) by mouth daily. 09/15/22   Pokhrel, Rebekah Chesterfield, MD  ascorbic acid (VITAMIN C) 500 MG tablet Take 500 mg by mouth daily with lunch.    [provider]  atorvastatin (LIPITOR) 40 MG tablet Take 1 tablet (40 mg total) by mouth daily. Patient taking differently: Take 40 mg by mouth at bedtime. 05/22/22   Jake Bathe, MD  Blood Glucose Calibration (TRUE METRIX LEVEL 1) Low SOLN  11/27/19   [provider]  Budeson-Glycopyrrol-Formoterol (BREZTRI AEROSPHERE) 160-9-4.8 MCG/ACT AERO Inhale 2 puffs into the lungs in the morning and at bedtime. 07/03/22   Parrett, Virgel Bouquet, NP  Carboxymethylcellulose Sodium (ARTIFICIAL TEARS OP) Place 1 drop into both eyes daily as needed (dry eyes).    [provider]  ELIQUIS 2.5 MG TABS tablet TAKE 1 TABLET TWICE DAILY 04/20/22  Jake Bathe, MD  ferrous sulfate 325 (65 FE) MG tablet Take 1 tablet (325 mg total) by mouth daily with breakfast. 01/26/20   Duke Salvia, MD  fluticasone [facility]) 50 MCG/ACT nasal spray USE 2 SPRAYS NASALLY DAILY Patient taking differently: Place 1 spray into both nostrils daily as needed for  allergies. USE 2 SPRAYS NASALLY DAILY 02/26/17   Leslye Peer, MD  gabapentin (NEURONTIN) 800 MG tablet Take 800 mg by mouth 3 (three) times daily.    [provider]  guaiFENesin (ROBITUSSIN) 100 MG/5ML liquid Take 5 mLs by mouth every 4 (four) hours as needed for cough or to loosen phlegm.    [provider]  HYDROcodone-acetaminophen (NORCO) 10-325 MG tablet Take 1 tablet by mouth every 6 (six) hours as needed. 09/15/22   Pokhrel, Rebekah Chesterfield, MD  HYDROXYZINE HCL PO Take 25 mg by mouth at bedtime as needed (insomnia).    [provider]  insulin aspart (NOVOLOG) 100 UNIT/ML FlexPen Inject 0-20 Units into the skin See admin instructions. Inject 0-20 units into the skin three times a day before meals "as needed," per [facility] SCALE    [provider]  insulin glargine (LANTUS) 100 UNIT/ML injection Inject 0.55 mLs (55 Units total) into the skin 2 (two) times daily. Patient taking differently: Inject 25-50 Units into the skin See admin instructions. Inject 25 units subcutaneously daily in the morning. Inject 50 units subcutaneously daily at bedtime.  Patient report 90 units in the morning 25 units at bedtime, was adamant regarding dosage. 07/27/21   Regalado, Belkys A, MD  levothyroxine (SYNTHROID) 200 MCG tablet Take 200 mcg by mouth daily before breakfast.    [provider]  loratadine (CLARITIN) 10 MG tablet Take 1 tablet (10 mg total) by mouth daily. 09/16/22   Pokhrel, Rebekah Chesterfield, MD  losartan (COZAAR) 25 MG tablet Take 25 mg by mouth daily. 03/07/22   [provider]  melatonin 5 MG TABS Take 1 tablet (5 mg total) by mouth at bedtime as needed. 09/15/22   Pokhrel, Rebekah Chesterfield, MD  nitroGLYCERIN (NITROSTAT) 0.4 MG SL tablet Place 1 tablet (0.4 mg total) under the tongue every 5 (five) minutes as needed for chest pain. 04/13/19   Jake Bathe, MD  polyethylene glycol (MIRALAX / GLYCOLAX) 17 g packet Take 17 g by mouth daily as needed for mild constipation or  moderate constipation. 09/15/22   Pokhrel, Rebekah Chesterfield, MD  PRESCRIPTION MEDICATION CPAP- At bedtime and as needed during any time of rest    [provider]  sennosides-docusate sodium (SENOKOT-S) 8.6-50 MG tablet Take 1 tablet by mouth daily.    [provider]  torsemide (DEMADEX) 20 MG tablet Take 2 tablets (40 mg total) by mouth daily. 09/15/22   Pokhrel, Rebekah Chesterfield, MD  TRUE METRIX BLOOD GLUCOSE TEST test strip  09/18/17   [provider]  TRUEplus Lancets 28G MISC  11/24/19   [provider]  vitamin B-12 (CYANOCOBALAMIN) 1000 MCG tablet Take 1,000 mcg by mouth daily with lunch.    [provider]  Vitamin D3 (VITAMIN D) 25 MCG tablet Take 1,000 Units by mouth daily with lunch.    [provider]    Physical Exam: Vitals:   10/25/22 0845 10/25/22 0900 10/25/22 0915 10/25/22 0930  BP: 139/62 (!) 133/55 (!) 139/55 (!) 120/52  Pulse: (!) 35 (!) 35 (!) 33 (!) 32  Resp: 17 18 14 14   Temp:      TempSrc:      SpO2:  100% 100% 99% 100%  Weight:      Height:       Constitutional: Obese elderly female who appears acutely ill Eyes: PERRL, lids and conjunctivae normal ENMT: Mucous membranes are moist.   Neck: normal, supple,   Respiratory: crackles appreciated in both lung fields on BiPAP. Cardiovascular: Bradycardic.  3+ pitting bilateral lower extremity edema.   Abdomen: no tenderness, no masses palpated.   Bowel sounds positive.  Foley catheter in place draining cloudy urine Skin: no rashes, lesions, ulcers. No induration Neurologic: CN 2-12 grossly intact.  Appears to move all extremities. Psychiatric: Lethargic unable to assess orientation at this time.  Data Reviewed:   EKG reveals sinus bradycardia 36 bpm.  Reviewed labs, imaging, and pertinent records   Family Communication: Family updated at bedside Primary team communication:  Thank you very much for involving [place] in the care of your patient.  Author: Clydie Braun,  MD 10/25/2022 10:06 AM  For on call review [email or web address].

## 2022-10-25 NOTE — ED Notes (Signed)
Attempted report to [facility]. Waiting on nurse to be sent to unit to take patient. Charge RN aware.

## 2022-10-25 NOTE — ED Notes (Signed)
X-ray at bedside

## 2022-10-25 NOTE — Consult Note (Signed)
Cardiology Consultation   Patient ID: Kayla [name] MRN: [id number]; DOB: [birth year]  Admit date: 10/25/2022 Date of Consult: 10/25/2022  PCP:  Ailene Ravel, MD   [facility] Providers Cardiologist:  Donato Schultz, MD  Electrophysiologist:  Sherryl Manges, MD       Patient Profile:   Kayla [name] is a 82 y.o. female with a hx of obesity, type 2 diabetes, hypertension, hyperlipidemia, CAD, congestive heart failure, paroxysmal atrial fibrillation (on Eliquis), sick sinus syndrome status post pacemaker, CKD, anemia, COPD, GERD, hypothyroidism, chronic venous insufficiency, status post left first ray amputation (09/04/2022) with active osteomyelitis who is being seen 10/25/2022 for the evaluation of bradycardia at the request of Dr. Rodena Medin.  History of Present Illness:   Ms. Kayla [name] presented to the emergency department this morning urgently with EMS with acute respiratory distress.  EMS was activated this morning after patient was found by her husband to be minimally responsive and in respiratory distress.  Upon EMS arrival, they initiated bag-valve-mask ventilation and noted bradycardia with heart rates in the 30s.  Per EMS run sheet, transcutaneous pacing was started and route to the emergency department.  Patient was not hypotensive during her transport.  On arrival to the emergency department, patient on 100% oxygen via nonrebreather.  She was not being actively ventilated.  Given concern of decreased work of breathing, patient was placed on BiPAP by emergency department provider.  I-STAT labs found potassium elevated at 6.6, creatinine elevated to 5.06.  BNP found elevated at 819.  ABG with acidosis, pH 7.165 with pCO2 of 63.8.  Patient had an acid-base deficit of 6.0.  Portable chest x-ray with diffuse bilateral bibasilar airspace disease, edema versus infection.  Bilateral pleural effusions also suspected (left lung base cut off in imaging).  Patient also hypothermic with  temperature of 94 F.  Bair hugger had been placed just prior to my exam.  On my exam, patient minimally responsive with BiPAP in place.  She will open her eyes to voice and attempts to nod her head yes and no.  HPI primarily obtained via patient's husband who is at bedside.  He advises that patient has had decline in the last week following a fall at home last Sunday 6/16.  He reports a mechanical fall, patient did not report dizziness or lightheadedness prior to.  On Tuesday of this past week, patient was noted to have increased wheezing and they presented to an urgent care.  These records are not available but per husband, patient was placed on antibiotics for possible pneumonia.  Patient continued to decline throughout the week and by Thursday/Friday, was having minimal urine output.  Patient was seen by her primary care on Friday, appears to have been given metolazone in addition to Lasix but continued to have little to no urine output.  This morning, as above, patient was difficult to arouse with respiratory distress prompting his call to EMS.  Of note, patient recently admitted from 5/2 to 5/14 for CE ID in the setting of MRSA bacteremia with osteomyelitis of her left foot.  A transesophageal echocardiogram was completed which showed thickening on the RV lead in her RA.  Patient had her pacemaker extracted on 5/7 and attempt perm was placed.  Beta-blockers were held and patient had no further evidence of chronotropic incompetence at that time and there was not reimplantation.  Patient subsequently wore a Zio monitor x 14 days, mailed back on 5/28.  This monitor showed minimum heart rate of 43  bpm, maximum heart rate of 120 bpm, average heart rate of 72 bpm.  First-degree AV block present.  Following her admission, patient was discharged to a skilled nursing facility with 6 weeks of IV daptomycin planned.  Patient returned to the emergency department on 5/29 with a clotted PICC line, noted to have sinus  bradycardia in the 40s to 50s.  She reported some dizziness and lightheadedness with physical therapy while at her skilled nursing facility but was without hemodynamic instability.  Noted to have a junctional rhythm while in the ER with clotted PICC line.  Patient was seen by EP APP on 6/14 and reported that she was doing well.  She completed her antibiotics on 6/18 and blood cultures from 6/13 had no growth to date.  Patient did note increased "swelling" and reported that she has been quite thirsty, drinking more water than usual.  At this visit, Demadex was increased to 60 mg for 3 days and neurology follow-up was encouraged.  Patient was continued on amiodarone with history of paroxysmal atrial fibrillation, Eliquis 2.5 mg twice daily.   Past Medical History:  Diagnosis Date   Allergy    Anxiety    Arthritis    CAD (coronary artery disease)    Mild per remote cath in 2006, /    Nuclear, January, 2013, no significant abnormality,   Carotid artery disease (HCC)    Doppler, January, 2013, 0 39% RIC A., 40-59% LICA, stable   Cataract    CHF (congestive heart failure) (HCC)    Chronic kidney disease    Complication of anesthesia    wakes up "mean"   COPD 02/16/2007   Intolerant of Spiriva, tudorza Intolerant of Trelegy    COPD (chronic obstructive pulmonary disease) (HCC)    Coronary atherosclerosis 11/24/2008   Catheterization 2006, nonobstructive coronary disease   //   nuclear 2013, low risk     Diabetes mellitus, type 2 (HCC)    Diverticula of colon    DIVERTICULOSIS OF COLON 03/23/2007   Qualifier: Diagnosis of  By: Craige Cotta MD, Vineet     Dizziness    occasional   Dizzy spells 05/04/2011   DM (diabetes mellitus) (HCC) 03/23/2007   Qualifier: Diagnosis of  By: Craige Cotta MD, Vineet     Dyspnea    On exertion   Ejection fraction    Essential hypertension 02/16/2007   Qualifier: Diagnosis of  By: Earlene Plater CMA, Tammy     G E R D 03/23/2007   Qualifier: Diagnosis of  By: Craige Cotta MD, Vineet      Gall bladder disease 04/28/2016   Gastritis and gastroduodenitis    GERD (gastroesophageal reflux disease)    Goiter    hx of   Headache(784.0)    migraine   Hyperlipidemia    Hypertension    Hypothyroidism    HYPOTHYROIDISM, POSTSURGICAL 06/04/2008   Qualifier: Diagnosis of  By: Everardo All MD, Sean A    Left ventricular hypertrophy    Leg edema    occasional swelling   Leg swelling 12/09/2018   Myofascial pain syndrome, cervical 01/06/2018   Obesity    OBESITY 11/24/2008   Qualifier: Diagnosis of  By: Vikki Ports     OBSTRUCTIVE SLEEP APNEA 02/16/2007   Auto CPAP 09/03/13 to 10/02/13 >> used on 30 of 30 nights with average 6 hrs and 3 min.  Average AHI is 3.1 with median CPAP 5 cm H2O and 95 th percentile CPAP 6 cm H2O.     Occipital neuralgia of right  side 01/06/2018   OSA (obstructive sleep apnea)    cpap setting of 12   Pain in joint of right hip 01/21/2018   Palpitations 01/21/2011   48 hour Holter, 2015, scattered PACs and PVCs.    Paroxysmal atrial fibrillation (HCC)    Pneumonia    PONV (postoperative nausea and vomiting)    severe after every surgery   Pulmonary hypertension, secondary    RHINITIS 07/21/2010        RUQ abdominal pain    S/P left TKA 07/27/2011   Sinus node dysfunction (HCC) 11/08/2015   Sleep apnea    Spinal stenosis of cervical region 11/07/2014   Urinary frequency 12/09/2018   UTI (urinary tract infection) 08/15/2019   per pt report   VENTRICULAR HYPERTROPHY, LEFT 11/24/2008   Qualifier: Diagnosis of  By: Vikki Ports      Past Surgical History:  Procedure Laterality Date   ABDOMINAL HYSTERECTOMY     with appendectomy and colon polypectomy   AMPUTATION TOE Left 12/15/2019   Procedure: AMPUTATION  LEFT GREAT TOE;  Surgeon: Park Liter, DPM;  Location: [facility];  Service: Podiatry;  Laterality: Left;   AMPUTATION TOE Left 01/22/2022   Procedure: AMPUTATION TOE 4th digit;  Surgeon: Candelaria Stagers, DPM;  Location: [facility];  Service:  Podiatry;  Laterality: Left;   AMPUTATION TOE Left 09/04/2022   Procedure: LEFT BIG TOE AMPUTAION;  Surgeon: Vivi Barrack, DPM;  Location: [facility];  Service: Podiatry;  Laterality: Left;   ANTERIOR CERVICAL DECOMP/DISCECTOMY FUSION N/A 11/07/2014   Procedure: Anterior Cervical diskectomy and fusion Cervical four-five, Cervical five-six, Cervical six-seven;  Surgeon: Donalee Citrin, MD;  Location: [facility];  Service: Neurosurgery;  Laterality: N/A;   APPENDECTOMY     ARTERY BIOPSY Right 02/16/2017   Procedure: BIOPSY TEMPORAL ARTERY;  Surgeon: Renford Dills, MD;  Location: [facility];  Service: Vascular;  Laterality: Right;   ARTERY BIOPSY Left 04/16/2020   Procedure: BIOPSY TEMPORAL ARTERY;  Surgeon: Renford Dills, MD;  Location: [facility] OR[facility];  Service: Vascular;  Laterality: Left;  LEFT TEMPLE   BACK SURGERY     CARDIAC CATHETERIZATION     CATARACT EXTRACTION W/ INTRAOCULAR LENS  IMPLANT, BILATERAL Bilateral    CHOLECYSTECTOMY N/A 04/29/2016   Procedure: LAPAROSCOPIC CHOLECYSTECTOMY WITH INTRAOPERATIVE CHOLANGIOGRAM;  Surgeon: Ovidio Kin, MD;  Location: [facility];  Service: General;  Laterality: N/A;   EP IMPLANTABLE DEVICE N/A 11/08/2015   MRI COMPATABLE -- Procedure: Pacemaker Implant;  Surgeon: Duke Salvia, MD;  Location: [facility];  Service: Cardiovascular;  Laterality: N/A;   INCISION AND DRAINAGE ABSCESS Right 05/30/2021   Procedure: IRRIGATION AND DEBRIDEMENT OF PREPATELLA BURSA;  Surgeon: Durene Romans, MD;  Location: [facility];  Service: Orthopedics;  Laterality: Right;  60 wants standard knee draping and pulse lavage   INCISION AND DRAINAGE ABSCESS Right 07/22/2021   Procedure: INCISION AND DRAINAGE ABSCESS RIGHT KNEE;  Surgeon: Durene Romans, MD;  Location: [facility];  Service: Orthopedics;  Laterality: Right;   INSERT / REPLACE / REMOVE PACEMAKER  2017   Dr. Cl[facility])   IR FLUORO GUIDE CV LINE RIGHT  07/25/2021   IR REMOVAL TUN CV CATH W/O FL  09/04/2021   IR US GUIDE  VASC ACCESS RIGHT  07/25/2021   JOINT REPLACEMENT     LEAD EXTRACTION N/A 09/08/2022   Procedure: LEAD EXTRACTION;  Surgeon: Marinus Maw, MD;  Location: [facility];  Service: Cardiovascular;  Laterality: N/A;  LEAD EXTRACTION N/A 09/11/2022   Procedure: LEAD EXTRACTION;  Surgeon: Marinus Maw, MD;  Location: [facility] INVAS[facility] CV [facility];  Service: Cardiovascular;  Laterality: N/A;   LUMBAR DISC SURGERY  1990's   x 2   TEE WITHOUT CARDIOVERSION N/A 09/07/2022   Procedure: TRANSESOPHAGEAL ECHOCARDIOGRAM;  Surgeon: Christell Constant, MD;  Location: [facility];  Service: Cardiovascular;  Laterality: N/A;   THYROIDECTOMY  2011   TOTAL KNEE ARTHROPLASTY  07/27/2011   Procedure: TOTAL KNEE ARTHROPLASTY;  Surgeon: Shelda Pal, MD;  Location: [facility];  Service: Orthopedics;  Laterality: Left;   UPPER ESOPHAGEAL ENDOSCOPIC ULTRASOUND (EUS) N/A 05/14/2016   Procedure: UPPER ESOPHAGEAL ENDOSCOPIC ULTRASOUND (EUS);  Surgeon: Rachael Fee, MD;  Location: [facility];  Service: Endoscopy;  Laterality: N/A;  radial only-will be done mod if needed     Home Medications:  Prior to Admission medications   Medication Sig Start Date End Date Taking? Authorizing Provider  acetaminophen (TYLENOL) 500 MG tablet Take 1,000-1,500 mg by mouth every 8 (eight) hours as needed for mild pain, moderate pain or headache.    [provider]  albuterol (PROVENTIL) (2.5 MG/3ML) 0.083% nebulizer solution Take 3 mLs (2.5 mg total) by nebulization every 2 (two) hours as needed for wheezing or shortness of breath. 07/03/22   Parrett, Virgel Bouquet, NP  albuterol (VENTOLIN HFA) 108 (90 Base) MCG/ACT inhaler Inhale 2 puffs into the lungs every 6 (six) hours as needed for wheezing or shortness of breath. 07/03/22   Parrett, Virgel Bouquet, NP  Alcohol Swabs (B-D SINGLE USE SWABS REGULAR) PADS  11/24/19   [provider]  allopurinol (ZYLOPRIM) 100 MG tablet Take 100 mg by mouth 2 (two) times daily. 04/16/19    [provider]  amiodarone (PACERONE) 200 MG tablet Take 1 tablet (200 mg total) by mouth daily. 05/22/22   Jake Bathe, MD  amLODipine (NORVASC) 5 MG tablet Take 2 tablets (10 mg total) by mouth daily. 09/15/22   Pokhrel, Rebekah Chesterfield, MD  ascorbic acid (VITAMIN C) 500 MG tablet Take 500 mg by mouth daily with lunch.    [provider]  atorvastatin (LIPITOR) 40 MG tablet Take 1 tablet (40 mg total) by mouth daily. Patient taking differently: Take 40 mg by mouth at bedtime. 05/22/22   Jake Bathe, MD  Blood Glucose Calibration (TRUE METRIX LEVEL 1) Low SOLN  11/27/19   [provider]  Budeson-Glycopyrrol-Formoterol (BREZTRI AEROSPHERE) 160-9-4.8 MCG/ACT AERO Inhale 2 puffs into the lungs in the morning and at bedtime. 07/03/22   Parrett, Virgel Bouquet, NP  Carboxymethylcellulose Sodium (ARTIFICIAL TEARS OP) Place 1 drop into both eyes daily as needed (dry eyes).    [provider]  ELIQUIS 2.5 MG TABS tablet TAKE 1 TABLET TWICE DAILY 04/20/22   Jake Bathe, MD  ferrous sulfate 325 (65 FE) MG tablet Take 1 tablet (325 mg total) by mouth daily with breakfast. 01/26/20   Duke Salvia, MD  fluticasone (FLONASE) 50 MCG/ACT nasal spray USE 2 SPRAYS NASALLY DAILY Patient taking differently: Place 1 spray into both nostrils daily as needed for allergies. USE 2 SPRAYS NASALLY DAILY 02/26/17   Leslye Peer, MD  gabapentin (NEURONTIN) 800 MG tablet Take 800 mg by mouth 3 (three) times daily.    [provider]  guaiFENesin (ROBITUSSIN) 100 MG/5ML liquid Take 5 mLs by mouth every 4 (four) hours as needed for cough or to loosen phlegm.    [provider]  HYDROcodone-acetaminophen [facility])  10-325 MG tablet Take 1 tablet by mouth every 6 (six) hours as needed. 09/15/22   Pokhrel, Rebekah Chesterfield, MD  HYDROXYZINE HCL PO Take 25 mg by mouth at bedtime as needed (insomnia).    [provider]  insulin aspart (NOVOLOG) 100 UNIT/ML FlexPen Inject 0-20 Units into the  skin See admin instructions. Inject 0-20 units into the skin three times a day before meals "as needed," per [facility] SCALE    [provider]  insulin glargine (LANTUS) 100 UNIT/ML injection Inject 0.55 mLs (55 Units total) into the skin 2 (two) times daily. Patient taking differently: Inject 25-50 Units into the skin See admin instructions. Inject 25 units subcutaneously daily in the morning. Inject 50 units subcutaneously daily at bedtime.  Patient report 90 units in the morning 25 units at bedtime, was adamant regarding dosage. 07/27/21   Regalado, Belkys A, MD  levothyroxine (SYNTHROID) 200 MCG tablet Take 200 mcg by mouth daily before breakfast.    [provider]  loratadine (CLARITIN) 10 MG tablet Take 1 tablet (10 mg total) by mouth daily. 09/16/22   Pokhrel, Rebekah Chesterfield, MD  losartan (COZAAR) 25 MG tablet Take 25 mg by mouth daily. 03/07/22   [provider]  melatonin 5 MG TABS Take 1 tablet (5 mg total) by mouth at bedtime as needed. 09/15/22   Pokhrel, Rebekah Chesterfield, MD  nitroGLYCERIN (NITROSTAT) 0.4 MG SL tablet Place 1 tablet (0.4 mg total) under the tongue every 5 (five) minutes as needed for chest pain. 04/13/19   Jake Bathe, MD  polyethylene glycol (MIRALAX / GLYCOLAX) 17 g packet Take 17 g by mouth daily as needed for mild constipation or moderate constipation. 09/15/22   Pokhrel, Rebekah Chesterfield, MD  PRESCRIPTION MEDICATION CPAP- At bedtime and as needed during any time of rest    [provider]  sennosides-docusate sodium (SENOKOT-S) 8.6-50 MG tablet Take 1 tablet by mouth daily.    [provider]  torsemide (DEMADEX) 20 MG tablet Take 2 tablets (40 mg total) by mouth daily. 09/15/22   Pokhrel, Rebekah Chesterfield, MD  TRUE METRIX BLOOD GLUCOSE TEST test strip  09/18/17   [provider]  TRUEplus Lancets 28G MISC  11/24/19   [provider]  vitamin B-12 (CYANOCOBALAMIN) 1000 MCG tablet Take 1,000 mcg by mouth daily with lunch.    [provider]  Vitamin D3 (VITAMIN D) 25 MCG tablet Take 1,000 Units by mouth daily with lunch.    [provider]    Inpatient Medications: Scheduled Meds:  vancomycin variable dose per unstable renal function (pharmacist dosing)   Does not apply See admin instructions   Continuous Infusions:  [START ON 10/26/2022] ceFEPime (MAXIPIME) IV     vancomycin 2,000 mg (10/25/22 0853)   PRN Meds:   Allergies:    Allergies  Allergen Reactions   Dulaglutide Nausea Only, Other (See Comments) and Cough    TRULICITY made the patient sick to her stomach (only) several days after using it   Oxycodone Itching and Other (See Comments)    Pt still takes this med even thought it causes itching   Trelegy Ellipta [Fluticasone-Umeclidin-Vilant] Nausea And Vomiting, Other (See Comments) and Cough    Made the patient sick to her stomach (only) several days after using it     Social History:   Social History   Socioeconomic History   Marital status: Married    Spouse name: Charmayne Odell   Number of children: 3   Years of education: 35  Highest education level: Not on file  Occupational History   Occupation: retired  Tobacco Use   Smoking status: Former    Packs/day: 2.00    Years: 33.00    Additional pack years: 0.00    Total pack years: 66.00    Types: Cigarettes    Start date: 1966    Quit date: 05/04/1997    Years since quitting: 25.4   Smokeless tobacco: Never  Vaping Use   Vaping Use: Never used  Substance and Sexual Activity   Alcohol use: No   Drug use: No   Sexual activity: Not on file  Other Topics Concern   Not on file  Social History Narrative   Patient lives at home with her husband Fayrene Fearing .   Retired.   Education 12 th grade.   Right handed   Caffeine two cups of coffee.   Social Determinants of Health   Financial Resource Strain: Low Risk  (01/31/2021)   Overall Financial Resource Strain (CARDIA)    Difficulty of Paying Living Expenses: Not very hard  Food  Insecurity: No Food Insecurity (10/01/2022)   Hunger Vital Sign    Worried About Running Out of Food in the Last Year: Never true    Ran Out of Food in the Last Year: Never true  Transportation Needs: No Transportation Needs (10/01/2022)   PRAPARE - Administrator, Civil Service (Medical): No    Lack of Transportation (Non-Medical): No  Physical Activity: Insufficiently Active (09/30/2021)   Exercise Vital Sign    Days of Exercise per Week: 5 days    Minutes of Exercise per Session: 20 min  Stress: No Stress Concern Present (09/30/2021)   [employer] of Occupational Health - Occupational Stress Questionnaire    Feeling of Stress : Not at all  Social Connections: Socially Integrated (09/30/2021)   Social Connection and Isolation Panel [NHANES]    Frequency of Communication with Friends and Family: More than three times a week    Frequency of Social Gatherings with Friends and Family: Twice a week    Attends Religious Services: 1 to 4 times per year    Active Member of [employer] or Organizations: Yes    Attends Banker Meetings: 1 to 4 times per year    Marital Status: Married  Catering manager Violence: Not At Risk (10/01/2022)   Humiliation, Afraid, Rape, and Kick questionnaire    Fear of Current or Ex-Partner: No    Emotionally Abused: No    Physically Abused: No    Sexually Abused: No    Family History:    Family History  Problem Relation Age of Onset   Heart Problems Mother    Heart defect Mother        valve problems   Heart Problems Father    Heart disease Brother    Lung cancer Daughter        non small cell    Diabetes Maternal Aunt    Diabetes Maternal Uncle    Throat cancer Brother    Colon cancer Neg Hx    Colon polyps Neg Hx    Esophageal cancer Neg Hx    Rectal cancer Neg Hx    Stomach cancer Neg Hx      ROS:  Please see the history of present illness.   All other ROS reviewed and negative.     Physical Exam/Data:   Vitals:    10/25/22 0815 10/25/22 0844 10/25/22 0845 10/25/22 0900  BP: Marland Kitchen)  150/51  139/62 (!) 133/55  Pulse: (!) 35  (!) 35 (!) 35  Resp: 13  17 18   Temp:  (!) 94.2 F (34.6 C)    TempSrc:  Rectal    SpO2: 99%  100% 100%  Weight:      Height:        Intake/Output Summary (Last 24 hours) at 10/25/2022 0924 Last data filed at 10/25/2022 0856 Gross per 24 hour  Intake 100 ml  Output 30 ml  Net 70 ml      10/25/2022    8:00 AM 10/06/2022    1:56 PM 10/01/2022    4:42 AM  Last 3 Weights  Weight (lbs) 230 lb 223 lb 9.6 oz 221 lb 5.5 oz  Weight (kg) 104.327 kg 101.424 kg 100.4 kg     Body mass index is 38.27 kg/m.  General: Acutely ill patient, minimally responsive on exam HEENT: normal Neck: JVP difficult to assess due to body habitus, does appear to be elevated Vascular: No carotid bruits; Distal pulses 2+ bilaterally Cardiac: Notable bradycardia, slightly irregular, holosystolic murmur heard best right upper sternal border Lungs:  BiPAP in place, anterior exam only. Diffusely diminished Abd: soft, nontender, no hepatomegaly  Ext: 3+ pitting edema bilaterally to knee Musculoskeletal:  No deformities Skin: warm and dry  Neuro: Limited assessment, patient lethargic, opens eyes to voice and attempts to nod yes and no to questions Psych: Unable to assess EKG:  The EKG was personally reviewed and demonstrates: bradycardia with baseline artifact, hyperdynamic T waves. Afib with SVR vs junctional Telemetry:  Telemetry was personally reviewed and demonstrates:  bradycardia with heart rate 30-40, junctional vs afib with SVR  Relevant CV Studies:  09/29/22 14 day Zio  Patient had a min HR of 43 bpm, max HR of 121 bpm, and avg HR of 72 bpm. Predominant underlying rhythm was Sinus Rhythm. First Degree AV Block was present. 14 Supraventricular Tachycardia runs occurred, the run with the fastest interval lasting 5 beats  with a max rate of 121 bpm, the longest lasting 7 beats with an avg rate of 105  bpm. Isolated SVEs were frequent (5.1%, 73423), SVE Couplets were rare (<1.0%, 1090), and SVE Triplets were rare (<1.0%, 344). Isolated VEs were rare (<1.0%, 821), VE  Couplets were rare (<1.0%, 21), and VE Triplets were rare (<1.0%, 6). Ventricular Trigeminy was present.  09/07/22 TEE   IMPRESSIONS     1. There is a 1.64 cm X 0.78 cm X 0.4 cm vegetation on the right  ventricular CIED lead at the level of the right atrium. Right ventricular  systolic function is normal. The right ventricular size is mildly  enlarged.   2. Left ventricular ejection fraction, by estimation, is 60 to 65%. The  left ventricle has normal function.   3. Left atrial size was mildly dilated. No left atrial/left atrial  appendage thrombus was detected.   4. Right atrial size was mildly dilated.   5. The mitral valve is degenerative. No evidence of mitral valve  regurgitation. No evidence of mitral stenosis. Moderate mitral annular  calcification.   6. Tricuspid valve regurgitation is moderate.   7. The aortic valve is tricuspid. Aortic valve regurgitation is trivial.  Aortic valve sclerosis is present, with no evidence of aortic valve  stenosis.   8. There is Moderate (Grade III) plaque involving the descending aorta.   FINDINGS   Left Ventricle: Left ventricular ejection fraction, by estimation, is 60  to 65%. The left ventricle has normal  function. The left ventricular  internal cavity size was normal in size.   Right Ventricle: There is a 1.64 cm X 0.78 cm X 0.4 cm vegetation on the  right ventricular CIED lead at the level of the right atrium. The right  ventricular size is mildly enlarged. Right vetricular wall thickness was  not well visualized. Right  ventricular systolic function is normal.   Left Atrium: Left atrial size was mildly dilated. No left atrial/left  atrial appendage thrombus was detected.   Right Atrium: Right atrial size was mildly dilated.   Pericardium: Trivial pericardial  effusion is present. The pericardial  effusion is circumferential.   Mitral Valve: The mitral valve is degenerative in appearance. Moderate  mitral annular calcification. No evidence of mitral valve regurgitation.  No evidence of mitral valve stenosis.   Tricuspid Valve: The tricuspid valve is grossly normal. Tricuspid valve  regurgitation is moderate . No evidence of tricuspid stenosis.   Aortic Valve: The aortic valve is tricuspid. Aortic valve regurgitation is  trivial. Aortic valve sclerosis is present, with no evidence of aortic  valve stenosis.   Pulmonic Valve: The pulmonic valve was normal in structure. Pulmonic valve  regurgitation is not visualized.   Aorta: The aortic root is normal in size and structure. There is moderate  (Grade III) plaque involving the descending aorta.   IAS/Shunts: No atrial level shunt detected by color flow Doppler.   Additional Comments: Spectral Doppler performed.   09/06/22 TTE  IMPRESSIONS     1. There is a highly mobile mass visualized on the right atrial lead  mesauring 2.5cmx1.8cm concerning for device lead vegetation (clip 40).  Recommend TEE for further evaluation if clinically indicated.   2. Left ventricular ejection fraction, by estimation, is 60 to 65%. The  left ventricle has normal function. The left ventricle has no regional  wall motion abnormalities. Left ventricular diastolic parameters were  normal.   3. Right ventricular systolic function is normal. The right ventricular  size is mildly enlarged. There is normal pulmonary artery systolic  pressure.   4. Left atrial size was mildly dilated.   5. The mitral valve is grossly normal. Trivial mitral valve  regurgitation.   6. The aortic valve is tricuspid. There is moderate calcification of the  aortic valve. There is moderate thickening of the aortic valve. Aortic  valve regurgitation is trivial. Mild aortic valve stenosis. Aortic valve  mean gradient measures 13.5  mmHg.  Aortic valve Vmax measures 2.46 m/s.   Comparison(s): Compared to prior TTE on 02/2022, there is now concern for  device lead vegetation as detailed above.   FINDINGS   Left Ventricle: Left ventricular ejection fraction, by estimation, is 60  to 65%. The left ventricle has normal function. The left ventricle has no  regional wall motion abnormalities. The left ventricular internal cavity  size was normal in size. There is   no left ventricular hypertrophy. Left ventricular diastolic parameters  were normal.   Right Ventricle: There is a highly mobile mass visualized on the right  atrial lead mesauring 2.5cmx1.8cm concerning for device lead vegetation  (clip 40). Recommend TEE for further evaluation if clinically indicated.  The right ventricular size is mildly  enlarged. No increase in right ventricular wall thickness. Right  ventricular systolic function is normal. There is normal pulmonary artery  systolic pressure. The tricuspid regurgitant velocity is 2.21 m/s, and  with an assumed right atrial pressure of 15  mmHg, the estimated right ventricular systolic pressure is  34.5 mmHg.   Left Atrium: Left atrial size was mildly dilated.   Right Atrium: Right atrial size was normal in size.   Pericardium: There is no evidence of pericardial effusion.   Mitral Valve: The mitral valve is grossly normal. Trivial mitral valve  regurgitation.   Tricuspid Valve: The tricuspid valve is grossly normal. Tricuspid valve  regurgitation is mild.   Aortic Valve: The aortic valve is tricuspid. There is moderate  calcification of the aortic valve. There is moderate thickening of the  aortic valve. Aortic valve regurgitation is trivial. Mild aortic stenosis  is present. Aortic valve mean gradient  measures 13.5 mmHg. Aortic valve peak gradient measures 24.1 mmHg. Aortic  valve area, by VTI measures 2.43 cm.   Pulmonic Valve: The pulmonic valve was grossly normal. Pulmonic valve   regurgitation is trivial.   Aorta: The aortic root is normal in size and structure.   IAS/Shunts: The atrial septum is grossly normal.   Laboratory Data:  High Sensitivity Troponin:   Recent Labs  Lab 10/25/22 0730  TROPONINIHS 33*     Chemistry Recent Labs  Lab 10/25/22 0729 10/25/22 0730 10/25/22 0745  NA 133*  133* 134* 133*  K 6.5*  6.6* 6.6* 6.6*  CL 105 99  --   CO2  --  19*  --   GLUCOSE 198* 207*  --   BUN >130* 120*  --   CREATININE 5.40* 5.06*  --   CALCIUM  --  8.9  --   GFRNONAA  --  8*  --   ANIONGAP  --  16*  --     No results for input(s): "PROT", "ALBUMIN", "AST", "ALT", "ALKPHOS", "BILITOT" in the last 168 hours. Lipids No results for input(s): "CHOL", "TRIG", "HDL", "LABVLDL", "LDLCALC", "CHOLHDL" in the last 168 hours.  Hematology Recent Labs  Lab 10/25/22 0729 10/25/22 0730 10/25/22 0745  WBC  --  18.3*  --   RBC  --  2.94*  --   HGB 9.9*  9.2* 8.4* 9.2*  HCT 29.0*  27.0* 28.6* 27.0*  MCV  --  97.3  --   MCH  --  28.6  --   MCHC  --  29.4*  --   RDW  --  16.6*  --   PLT  --  478*  --    Thyroid No results for input(s): "TSH", "FREET4" in the last 168 hours.  BNP Recent Labs  Lab 10/25/22 0731  BNP 819.8*    DDimer No results for input(s): "DDIMER" in the last 168 hours.   Radiology/Studies:  DG Chest Portable 1 View  Result Date: 10/25/2022 CLINICAL DATA:  Respiratory distress. EXAM: PORTABLE CHEST 1 VIEW COMPARISON:  09/11/2022 FINDINGS: Rotated film. The cardio pericardial silhouette is enlarged. Bilateral diffuse, basilar predominant airspace disease is stable to mildly progressive in the interval. Left lung base is not been included on the study. Bilateral pleural effusions suspected. Bones are diffusely demineralized. Telemetry leads overlie the chest. IMPRESSION: 1. Rotated film with bilateral diffuse, basilar predominant airspace disease, stable to mildly progressive in the interval. Imaging features likely reflect edema  although diffuse infection could have this appearance. 2. Suspect bilateral pleural effusions. 3. Left lung base not included on the study. Electronically Signed   By: Kennith Center M.D.   On: 10/25/2022 07:48     Assessment and Plan:  Kayla [name] is a 82 y.o. 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  Bradycardia Junctional rhythm History of sick sinus syndrome  Atrial fibrillation  Patient's status post pacemaker extraction on 5/7 following vegetation on RV lead in the setting of MRSA bacteremia.  Did not have evidence of chronotropic incompetence following extraction and did not undergo reimplantation.  Patient wore a 14-day ZIO that showed that patient had a min HR of 43 bpm, max HR of 121 bpm, and avg HR of 72 bpm. Predominant underlying rhythm was Sinus Rhythm. First Degree AV Block was present.  Patient now admitted in the setting of respiratory failure, found with notable bradycardia that appears to be junctional rhythm, though possibly atrial fibrillation with slow ventricular response.  Patient is acutely ill, currently hypothermic and acidotic with concurrent renal failure and notable electrolyte derangements.  Will need stabilization of these before further electrophysiology consideration of reimplantation of permanent pacemaker. Patient currently has hemodynamic stability but may require temporary pacemaker if this changes. Hold AV nodal blocking agents Okay to hold amiodarone although given long half-life, will not significantly impact heart rate. Given possible intervention, would hold Eliquis and bridge with heparin  Acute respiratory failure Acute diastolic congestive heart  failure History of COPD  Patient admitted with acute respiratory failure, evidence of volume overload despite outpatient Torsemide (and possibly Metolazone per PCP?).  BNP 819.8. Suspect that volume overload is primarily the result of renal failure and oliguria over the last few days.  Nephrology has been consulted, will defer to them regarding volume management. She has received 80mg  of lasix and has produced <46mL urine so far in the ED. Last TTE showed LVEF 60-65%. Patient warm on exam but will check limited echo to ensure no acute EF change.  Acute renal failure  Per husband, patient with little to no urine output over the last 2 to 3 days.  This was despite torsemide.  Today found with potassium of 6.6, creatinine of 5.06.  Nephrology has been consulted.  Hx nonobstructive CAD Hyperlipidemia  Patient with history of nonobstructive CAD on cath in 2006.  Low risk Lexiscan Myoview in 2013.  Troponin 33 this admission in the setting of acute renal failure and hypoxic respiratory failure.  Not consistent with ACS.  Okay to continue statin therapy.  Risk Assessment/Risk Scores:        New York Heart Association (NYHA) Functional Class NYHA Class IV  CHA2DS2-VASc Score = 7   This indicates a 11.2% annual risk of stroke. The patient's score is based upon: CHF History: 1 HTN History: 1 Diabetes History: 1 Stroke History: 0 Vascular Disease History: 1 Age Score: 2 Gender Score: 1         For questions or updates, please contact [facility] Please consult [email or web address] for contact info under    Signed, Perlie Gold, PA-C  10/25/2022 9:24 AM

## 2022-10-25 NOTE — ED Triage Notes (Signed)
Per [facility] pt coming from home initially called out for respiratory distress stating when she woke up breathing 4 times a minute. Began bagging patient and then pt became brady into 30s and ems began pacing. Patient became more alert in route but continued being paced en route. On arrival patient responsive to verbal stimuli.

## 2022-10-25 NOTE — Progress Notes (Signed)
   10/25/22 1103  BiPAP/CPAP/SIPAP  BiPAP/CPAP/SIPAP Pt Type Adult  BiPAP/CPAP/SIPAP SERVO  Mask Type Full face mask  Mask Size Medium  Set Rate 22 breaths/min  Respiratory Rate 28 breaths/min  IPAP 10 cmH20  EPAP 5 cmH2O  FiO2 (%) 50 %  Minute Ventilation 9.9  Leak 23  Peak Inspiratory Pressure (PIP) 15  Tidal Volume (Vt) 485  Patient Home Equipment No  Auto Titrate No  Press High Alarm 30 cmH2O  Oxygen Percent 100 %

## 2022-10-25 NOTE — Procedures (Signed)
Central Venous Catheter Insertion Procedure Note  JAMESINA GAUGH  [id number]  15-Dec-1940  Date:10/25/22  Time:12:29 PM   Provider Performing:Emmalyne Giacomo   Procedure: Insertion of Non-tunneled Central Venous Catheter(36556)with US guidance (40981)    Indication(s) Hemodialysis  Consent Risks of the procedure as well as the alternatives and risks of each were explained to the patient and/or caregiver.  Consent for the procedure was obtained and is signed in the bedside chart  Anesthesia Topical only with 1% lidocaine   Timeout Verified patient identification, verified procedure, site/side was marked, verified correct patient position, special equipment/implants available, medications/allergies/relevant history reviewed, required imaging and test results available.  Sterile Technique Maximal sterile technique including full sterile barrier drape, hand hygiene, sterile gown, sterile gloves, mask, hair covering, sterile ultrasound probe cover (if used).  Procedure Description Area of catheter insertion was cleaned with chlorhexidine and draped in sterile fashion.   With real-time ultrasound guidance a HD catheter was placed into the right internal jugular vein.  Nonpulsatile blood flow and easy flushing noted in all ports.  The catheter was sutured in place and sterile dressing applied.  Complications/Tolerance None; patient tolerated the procedure well. Chest X-ray is ordered to verify placement for internal jugular or subclavian cannulation.  Chest x-ray is not ordered for femoral cannulation.  EBL Minimal  Specimen(s) None

## 2022-10-25 NOTE — Progress Notes (Signed)
RT transported pt on bipap from ED resus to 2H04 without any issues. RN and tech at bedside.

## 2022-10-25 NOTE — H&P (Signed)
NAME:  Kayla [name], MRN:  [id number], DOB:  1940/05/19, LOS: 0 ADMISSION DATE:  10/25/2022, CONSULTATION DATE: 10/25/2022 REFERRING MD: Dr. Katrinka Blazing, CHIEF COMPLAINT: Altered mental status and shortness of breath  History of Present Illness:  82 year old female with hypertension, hyperlipidemia, CKD stage IV coronary artery disease, paroxysmal A-fib on Eliquis was brought into the emergency department accompanied by her husband because of respiratory distress and altered mental status.  As per patient's husband she is having increasing shortness of breath for last few days but this morning it got worse, so he called EMS, upon arrival patient was found to be in respiratory distress, initially BVM ventilation was performed, she was noted to be bradycardic with heart rate in 30s, started on transcutaneous pacing, initially was placed on nonrebreather facemask.  In ED patient was switched from facemask to BiPAP, she was noted to be severely bradycardic with heart rate in 30s with stable systolic blood pressure around 140.  She was confused, minimally responsive, ABG was done which showed pH 7.1 with high pCO2 and low bicarbonate.  Also noted to have AKI with baseline serum creatinine around 2.5 and admission creatinine of 5.4 with serum potassium of 6.6.  She was given calcium gluconate, sodium bicarbonate and Lasix.  PCCM was consulted for help evaluation medical management  Pertinent  Medical History   Past Medical History:  Diagnosis Date   Allergy    Anxiety    Arthritis    CAD (coronary artery disease)    Mild per remote cath in 2006, /    Nuclear, January, 2013, no significant abnormality,   Carotid artery disease (HCC)    Doppler, January, 2013, 0 39% RIC A., 40-59% LICA, stable   Cataract    CHF (congestive heart failure) (HCC)    Chronic kidney disease    Complication of anesthesia    wakes up "mean"   COPD 02/16/2007   Intolerant of Spiriva, tudorza Intolerant of Trelegy    COPD  (chronic obstructive pulmonary disease) (HCC)    Coronary atherosclerosis 11/24/2008   Catheterization 2006, nonobstructive coronary disease   //   nuclear 2013, low risk     Diabetes mellitus, type 2 (HCC)    Diverticula of colon    DIVERTICULOSIS OF COLON 03/23/2007   Qualifier: Diagnosis of  By: Craige Cotta MD, Vineet     Dizziness    occasional   Dizzy spells 05/04/2011   DM (diabetes mellitus) (HCC) 03/23/2007   Qualifier: Diagnosis of  By: Craige Cotta MD, Vineet     Dyspnea    On exertion   Ejection fraction    Essential hypertension 02/16/2007   Qualifier: Diagnosis of  By: Earlene Plater CMA, Tammy     G E R D 03/23/2007   Qualifier: Diagnosis of  By: Craige Cotta MD, Vineet     Gall bladder disease 04/28/2016   Gastritis and gastroduodenitis    GERD (gastroesophageal reflux disease)    Goiter    hx of   Headache(784.0)    migraine   Hyperlipidemia    Hypertension    Hypothyroidism    HYPOTHYROIDISM, POSTSURGICAL 06/04/2008   Qualifier: Diagnosis of  By: Everardo All MD, Sean A    Left ventricular hypertrophy    Leg edema    occasional swelling   Leg swelling 12/09/2018   Myofascial pain syndrome, cervical 01/06/2018   Obesity    OBESITY 11/24/2008   Qualifier: Diagnosis of  By: Vikki Ports     OBSTRUCTIVE SLEEP APNEA 02/16/2007   Auto CPAP  09/03/13 to 10/02/13 >> used on 30 of 30 nights with average 6 hrs and 3 min.  Average AHI is 3.1 with median CPAP 5 cm H2O and 95 th percentile CPAP 6 cm H2O.     Occipital neuralgia of right side 01/06/2018   OSA (obstructive sleep apnea)    cpap setting of 12   Pain in joint of right hip 01/21/2018   Palpitations 01/21/2011   48 hour Holter, 2015, scattered PACs and PVCs.    Paroxysmal atrial fibrillation (HCC)    Pneumonia    PONV (postoperative nausea and vomiting)    severe after every surgery   Pulmonary hypertension, secondary    RHINITIS 07/21/2010        RUQ abdominal pain    S/P left TKA 07/27/2011   Sinus node dysfunction (HCC) 11/08/2015    Sleep apnea    Spinal stenosis of cervical region 11/07/2014   Urinary frequency 12/09/2018   UTI (urinary tract infection) 08/15/2019   per pt report   VENTRICULAR HYPERTROPHY, LEFT 11/24/2008   Qualifier: Diagnosis of  By: Vikki Ports       Significant Hospital Events: Including procedures, antibiotic start and stop dates in addition to other pertinent events     Interim History / Subjective:  As above  Objective   Blood pressure (!) 109/36, pulse (!) 30, temperature (!) 94.2 F (34.6 C), temperature source Rectal, resp. rate 13, height 5\' 5"  (1.651 m), weight 110.2 kg, SpO2 100 %.    FiO2 (%):  [50 %-100 %] 50 %   Intake/Output Summary (Last 24 hours) at 10/25/2022 1230 Last data filed at 10/25/2022 0856 Gross per 24 hour  Intake 100 ml  Output 30 ml  Net 70 ml   Filed Weights   10/25/22 0800 10/25/22 1103  Weight: 104.3 kg 110.2 kg    Examination: Physical exam: General: Acute on chronically ill-appearing morbidly obese female, lying on the bed HEENT: [place]/AT, eyes anicteric.  moist mucus membranes.  On BiPAP Neuro: Barely opens eyes with painful stimuli, moving all 4 extremities not following commands Chest: Lateral basal crackles, no wheezes or rhonchi Heart: Bradycardic with heart rate in 30s Abdomen: Soft, nondistended, bowel sounds present Skin: No rash  Labs and images were reviewed Resolved Hospital Problem list     Assessment & Plan:  Acute hypoxic/hypercapnic respiratory failure Mixed metabolic/respiratory acidosis Acute on chronic HFpEF AKI on CKD stage IV Hyperkalemia/hyponatremia Acute metabolic/uremic encephalopathy Sepsis due to community-acquired pneumonia Bradycardia/junctional rhythm, history of sick sinus syndrome Paroxysmal A-fib Anemia of chronic disease  Continue BiPAP for now Trend ABGs Patient with high anion gap metabolic acidosis and respiratory acidosis She received 2 A of bicarbonate Appreciate nephrology consult, will  be started on CRRT Patient received calcium gluconate, sodium bicarbonate and Lasix Closely monitor serum potassium Avoid sedation Continue broad-spectrum antibiotics with vancomycin and Zosyn for now Follow-up cultures Started on dopamine Appreciate cardiology consult Closely monitor electrolytes Monitor intake and output Avoid nephrotoxic agents Hold anticoagulation Monitor H&H Admit to ICU  Best Practice (right click and "Reselect all SmartList Selections" daily)   Diet/type: NPO DVT prophylaxis: SCD GI prophylaxis: N/A Lines: Dialysis Catheter and Arterial Line Foley:  Yes, and it is still needed Code Status:  DNR Last date of multidisciplinary goals of care discussion [6/23: Goals of care discussions were carried with patient's husband, he decided to continue full scope of care while keeping her DNR/DNI]  Labs   CBC: Recent Labs  Lab 10/25/22 0729 10/25/22 0730 10/25/22  0745  WBC  --  18.3*  --   NEUTROABS  --  15.2*  --   HGB 9.9*  9.2* 8.4* 9.2*  HCT 29.0*  27.0* 28.6* 27.0*  MCV  --  97.3  --   PLT  --  478*  --     Basic Metabolic Panel: Recent Labs  Lab 10/25/22 0729 10/25/22 0730 10/25/22 0745  NA 133*  133* 134* 133*  K 6.5*  6.6* 6.6* 6.6*  CL 105 99  --   CO2  --  19*  --   GLUCOSE 198* 207*  --   BUN >130* 120*  --   CREATININE 5.40* 5.06*  --   CALCIUM  --  8.9  --    GFR: Estimated Creatinine Clearance: 10.8 mL/min (A) (by C-G formula based on SCr of 5.06 mg/dL (H)). Recent Labs  Lab 10/25/22 0730 10/25/22 0748 10/25/22 1016  PROCALCITON  --   --  0.16  WBC 18.3*  --   --   LATICACIDVEN  --  0.6  --     Liver Function Tests: No results for input(s): "AST", "ALT", "ALKPHOS", "BILITOT", "PROT", "ALBUMIN" in the last 168 hours. No results for input(s): "LIPASE", "AMYLASE" in the last 168 hours. No results for input(s): "AMMONIA" in the last 168 hours.  ABG    Component Value Date/Time   PHART 7.165 (LL) 10/25/2022 0745    PCO2ART 63.8 (H) 10/25/2022 0745   PO2ART 371 (H) 10/25/2022 0745   HCO3 23.4 10/25/2022 0745   TCO2 25 10/25/2022 0745   ACIDBASEDEF 6.0 (H) 10/25/2022 0745   O2SAT 100 10/25/2022 0745     Coagulation Profile: No results for input(s): "INR", "PROTIME" in the last 168 hours.  Cardiac Enzymes: No results for input(s): "CKTOTAL", "CKMB", "CKMBINDEX", "TROPONINI" in the last 168 hours.  HbA1C: Hgb A1c MFr Bld  Date/Time Value Ref Range Status  09/04/2022 01:29 AM 6.8 (H) 4.8 - 5.6 % Final    Comment:    (NOTE) Pre diabetes:          5.7%-6.4%  Diabetes:              >6.4%  Glycemic control for   <7.0% adults with diabetes   05/30/2021 10:23 AM 8.4 (H) 4.8 - 5.6 % Final    Comment:    (NOTE) Pre diabetes:          5.7%-6.4%  Diabetes:              >6.4%  Glycemic control for   <7.0% adults with diabetes     CBG: Recent Labs  Lab 10/25/22 0904 10/25/22 1212  GLUCAP 202* 159*    Review of Systems:   Unable to obtain as patient is encephalopathic  Past Medical History:  She,  has a past medical history of Allergy, Anxiety, Arthritis, CAD (coronary artery disease), Carotid artery disease (HCC), Cataract, CHF (congestive heart failure) (HCC), Chronic kidney disease, Complication of anesthesia, COPD (02/16/2007), COPD (chronic obstructive pulmonary disease) (HCC), Coronary atherosclerosis (11/24/2008), Diabetes mellitus, type 2 (HCC), Diverticula of colon, DIVERTICULOSIS OF COLON (03/23/2007), Dizziness, Dizzy spells (05/04/2011), DM (diabetes mellitus) (HCC) (03/23/2007), Dyspnea, Ejection fraction, Essential hypertension (02/16/2007), G E R D (03/23/2007), Gall bladder disease (04/28/2016), Gastritis and gastroduodenitis, GERD (gastroesophageal reflux disease), Goiter, Headache(784.0), Hyperlipidemia, Hypertension, Hypothyroidism, HYPOTHYROIDISM, POSTSURGICAL (06/04/2008), Left ventricular hypertrophy, Leg edema, Leg swelling (12/09/2018), Myofascial pain syndrome, cervical  (01/06/2018), Obesity, OBESITY (11/24/2008), OBSTRUCTIVE SLEEP APNEA (02/16/2007), Occipital neuralgia of right side (01/06/2018), OSA (obstructive sleep  apnea), Pain in joint of right hip (01/21/2018), Palpitations (01/21/2011), Paroxysmal atrial fibrillation (HCC), Pneumonia, PONV (postoperative nausea and vomiting), Pulmonary hypertension, secondary, RHINITIS (07/21/2010), RUQ abdominal pain, S/P left TKA (07/27/2011), Sinus node dysfunction (HCC) (11/08/2015), Sleep apnea, Spinal stenosis of cervical region (11/07/2014), Urinary frequency (12/09/2018), UTI (urinary tract infection) (08/15/2019), and VENTRICULAR HYPERTROPHY, LEFT (11/24/2008).   Surgical History:   Past Surgical History:  Procedure Laterality Date   ABDOMINAL HYSTERECTOMY     with appendectomy and colon polypectomy   AMPUTATION TOE Left 12/15/2019   Procedure: AMPUTATION  LEFT GREAT TOE;  Surgeon: Park Liter, DPM;  Location: [facility];  Service: Podiatry;  Laterality: Left;   AMPUTATION TOE Left 01/22/2022   Procedure: AMPUTATION TOE 4th digit;  Surgeon: Candelaria Stagers, DPM;  Location: [facility];  Service: Podiatry;  Laterality: Left;   AMPUTATION TOE Left 09/04/2022   Procedure: LEFT BIG TOE AMPUTAION;  Surgeon: Vivi Barrack, DPM;  Location: [facility];  Service: Podiatry;  Laterality: Left;   ANTERIOR CERVICAL DECOMP/DISCECTOMY FUSION N/A 11/07/2014   Procedure: Anterior Cervical diskectomy and fusion Cervical four-five, Cervical five-six, Cervical six-seven;  Surgeon: Donalee Citrin, MD;  Location: [facility];  Service: Neurosurgery;  Laterality: N/A;   APPENDECTOMY     ARTERY BIOPSY Right 02/16/2017   Procedure: BIOPSY TEMPORAL ARTERY;  Surgeon: Renford Dills, MD;  Location: [facility];  Service: Vascular;  Laterality: Right;   ARTERY BIOPSY Left 04/16/2020   Procedure: BIOPSY TEMPORAL ARTERY;  Surgeon: Renford Dills, MD;  Location: [facility];  Service: Vascular;  Laterality: Left;  LEFT TEMPLE   BACK SURGERY     CARDIAC  CATHETERIZATION     CATARACT EXTRACTION W/ INTRAOCULAR LENS  IMPLANT, BILATERAL Bilateral    CHOLECYSTECTOMY N/A 04/29/2016   Procedure: LAPAROSCOPIC CHOLECYSTECTOMY WITH INTRAOPERATIVE CHOLANGIOGRAM;  Surgeon: Ovidio Kin, MD;  Location: [facility];  Service: General;  Laterality: N/A;   EP IMPLANTABLE DEVICE N/A 11/08/2015   MRI COMPATABLE -- Procedure: Pacemaker Implant;  Surgeon: Duke Salvia, MD;  [facility]: [facility];  Service: Cardiovascular;  Laterality: N/A;   INCISION AND DRAINAGE ABSCESS Right 05/30/2021   Procedure: IRRIGATION AND DEBRIDEMENT OF PREPATELLA BURSA;  Surgeon: Durene Romans, MD;  Location: [facility];  Service: Orthopedics;  Laterality: Right;  60 wants standard knee draping and pulse lavage   INCISION AND DRAINAGE ABSCESS Right 07/22/2021   Procedure: INCISION AND DRAINAGE ABSCESS RIGHT KNEE;  Surgeon: Durene Romans, MD;  Location: [facility];  Service: Orthopedics;  Laterality: Right;   INSERT / REPLACE / REMOVE PACEMAKER  2017   Dr. Clide Cliff [facility])   IR FLUORO GUIDE CV LINE RIGHT  07/25/2021   IR REMOVAL TUN CV CATH W/O FL  09/04/2021   IR US GUIDE VASC ACCESS RIGHT  07/25/2021   JOINT REPLACEMENT     LEAD EXTRACTION N/A 09/08/2022   Procedure: LEAD EXTRACTION;  Surgeon: Marinus Maw, MD;  [facility];  Service: Cardiovascular;  Laterality: N/A;   LEAD EXTRACTION N/A 09/11/2022   Procedure: LEAD EXTRACTION;  Surgeon: Marinus Maw, MD;  [facility];  Service: Cardiovascular;  Laterality: N/A;   LUMBAR DISC SURGERY  1990's   x 2   TEE WITHOUT CARDIOVERSION N/A 09/07/2022   Procedure: TRANSESOPHAGEAL ECHOCARDIOGRAM;  Surgeon: Christell Constant, MD;  [facility];  Service: Cardiovascular;  Laterality: N/A;   THYROIDECTOMY  2011   TOTAL KNEE ARTHROPLASTY  07/27/2011   Procedure: TOTAL  KNEE ARTHROPLASTY;  Surgeon: Shelda Pal, MD;  Location: [facility];  Service: Orthopedics;  Laterality: Left;   UPPER ESOPHAGEAL  ENDOSCOPIC ULTRASOUND (EUS) N/A 05/14/2016   Procedure: UPPER ESOPHAGEAL ENDOSCOPIC ULTRASOUND (EUS);  Surgeon: Rachael Fee, MD;  Location: [facility];  Service: Endoscopy;  Laterality: N/A;  radial only-will be done mod if needed     Social History:   reports that she quit smoking about 25 years ago. Her smoking use included cigarettes. She started smoking about 58 years ago. She has a 66.00 pack-year smoking history. She has never used smokeless tobacco. She reports that she does not drink alcohol and does not use drugs.   Family History:  Her family history includes Diabetes in her maternal aunt and maternal uncle; Heart Problems in her father and mother; Heart defect in her mother; Heart disease in her brother; Lung cancer in her daughter; Throat cancer in her brother. There is no history of Colon cancer, Colon polyps, Esophageal cancer, Rectal cancer, or Stomach cancer.   Allergies Allergies  Allergen Reactions   Dulaglutide Nausea Only, Other (See Comments) and Cough    TRULICITY made the patient sick to her stomach (only) several days after using it   Oxycodone Itching and Other (See Comments)    Pt still takes this med even thought it causes itching   Trelegy Ellipta [Fluticasone-Umeclidin-Vilant] Nausea And Vomiting, Other (See Comments) and Cough    Made the patient sick to her stomach (only) several days after using it      Home Medications  Prior to Admission medications   Medication Sig Start Date End Date Taking? Authorizing Provider  acetaminophen (TYLENOL) 500 MG tablet Take 1,000-1,500 mg by mouth every 8 (eight) hours as needed for mild pain, moderate pain or headache.    [provider]  albuterol (PROVENTIL) (2.5 MG/3ML) 0.083% nebulizer solution Take 3 mLs (2.5 mg total) by nebulization every 2 (two) hours as needed for wheezing or shortness of breath. 07/03/22   Parrett, Virgel Bouquet, NP  albuterol (VENTOLIN HFA) 108 (90 Base) MCG/ACT inhaler Inhale 2 puffs into  the lungs every 6 (six) hours as needed for wheezing or shortness of breath. 07/03/22   Parrett, Virgel Bouquet, NP  Alcohol Swabs (B-D SINGLE USE SWABS REGULAR) PADS  11/24/19   [provider]  allopurinol (ZYLOPRIM) 100 MG tablet Take 100 mg by mouth 2 (two) times daily. 04/16/19   [provider]  amiodarone (PACERONE) 200 MG tablet Take 1 tablet (200 mg total) by mouth daily. 05/22/22   Jake Bathe, MD  amLODipine (NORVASC) 5 MG tablet Take 2 tablets (10 mg total) by mouth daily. 09/15/22   Pokhrel, Rebekah Chesterfield, MD  ascorbic acid (VITAMIN C) 500 MG tablet Take 500 mg by mouth daily with lunch.    [provider]  atorvastatin (LIPITOR) 40 MG tablet Take 1 tablet (40 mg total) by mouth daily. Patient taking differently: Take 40 mg by mouth at bedtime. 05/22/22   Jake Bathe, MD  Blood Glucose Calibration (TRUE METRIX LEVEL 1) Low SOLN  11/27/19   [provider]  Budeson-Glycopyrrol-Formoterol (BREZTRI AEROSPHERE) 160-9-4.8 MCG/ACT AERO Inhale 2 puffs into the lungs in the morning and at bedtime. 07/03/22   Parrett, Virgel Bouquet, NP  Carboxymethylcellulose Sodium (ARTIFICIAL TEARS OP) Place 1 drop into both eyes daily as needed (dry eyes).    [provider]  ELIQUIS 2.5 MG TABS tablet TAKE 1 TABLET TWICE DAILY 04/20/22   Jake Bathe, MD  ferrous sulfate 325 (65 FE) MG tablet Take 1 tablet (325 mg total) by mouth daily with breakfast. 01/26/20   Duke Salvia, MD  fluticasone [facility]) 50 MCG/ACT nasal spray USE 2 SPRAYS NASALLY DAILY Patient taking differently: Place 1 spray into both nostrils daily as needed for allergies. USE 2 SPRAYS NASALLY DAILY 02/26/17   Leslye Peer, MD  gabapentin (NEURONTIN) 800 MG tablet Take 800 mg by mouth 3 (three) times daily.    [provider]  guaiFENesin (ROBITUSSIN) 100 MG/5ML liquid Take 5 mLs by mouth every 4 (four) hours as needed for cough or to loosen phlegm.    [provider]  HYDROcodone-acetaminophen  (NORCO) 10-325 MG tablet Take 1 tablet by mouth every 6 (six) hours as needed. 09/15/22   Pokhrel, Rebekah Chesterfield, MD  HYDROXYZINE HCL PO Take 25 mg by mouth at bedtime as needed (insomnia).    [provider]  insulin aspart (NOVOLOG) 100 UNIT/ML FlexPen Inject 0-20 Units into the skin See admin instructions. Inject 0-20 units into the skin three times a day before meals "as needed," per [facility] SCALE    [provider]  insulin glargine (LANTUS) 100 UNIT/ML injection Inject 0.55 mLs (55 Units total) into the skin 2 (two) times daily. Patient taking differently: Inject 25-50 Units into the skin See admin instructions. Inject 25 units subcutaneously daily in the morning. Inject 50 units subcutaneously daily at bedtime.  Patient report 90 units in the morning 25 units at bedtime, was adamant regarding dosage. 07/27/21   Regalado, Belkys A, MD  levothyroxine (SYNTHROID) 200 MCG tablet Take 200 mcg by mouth daily before breakfast.    [provider]  loratadine (CLARITIN) 10 MG tablet Take 1 tablet (10 mg total) by mouth daily. 09/16/22   Pokhrel, Rebekah Chesterfield, MD  losartan (COZAAR) 25 MG tablet Take 25 mg by mouth daily. 03/07/22   [provider]  melatonin 5 MG TABS Take 1 tablet (5 mg total) by mouth at bedtime as needed. 09/15/22   Pokhrel, Rebekah Chesterfield, MD  nitroGLYCERIN (NITROSTAT) 0.4 MG SL tablet Place 1 tablet (0.4 mg total) under the tongue every 5 (five) minutes as needed for chest pain. 04/13/19   Jake Bathe, MD  polyethylene glycol (MIRALAX / GLYCOLAX) 17 g packet Take 17 g by mouth daily as needed for mild constipation or moderate constipation. 09/15/22   Pokhrel, Rebekah Chesterfield, MD  PRESCRIPTION MEDICATION CPAP- At bedtime and as needed during any time of rest    [provider]  sennosides-docusate sodium (SENOKOT-S) 8.6-50 MG tablet Take 1 tablet by mouth daily.    [provider]  torsemide (DEMADEX) 20 MG tablet Take 2 tablets (40 mg total) by mouth daily.  09/15/22   Pokhrel, Rebekah Chesterfield, MD  TRUE METRIX BLOOD GLUCOSE TEST test strip  09/18/17   [provider]  TRUEplus Lancets 28G MISC  11/24/19   [provider]  vitamin B-12 (CYANOCOBALAMIN) 1000 MCG tablet Take 1,000 mcg by mouth daily with lunch.    [provider]  Vitamin D3 (VITAMIN D) 25 MCG tablet Take 1,000 Units by mouth daily with lunch.    [provider]     Critical care time:     The patient is critically ill due to acute renal failure/acute hypoxic/hypercapnic respiratory failure Sepsis due to pneumonia.  Critical care was necessary to treat or prevent imminent or life-threatening deterioration.  Critical care was time spent personally by me on the following activities: development of treatment plan with patient  and/or surrogate as well as nursing, discussions with consultants, evaluation of patient's response to treatment, examination of patient, obtaining history from patient or surrogate, ordering and performing treatments and interventions, ordering and review of laboratory studies, ordering and review of radiographic studies, pulse oximetry, re-evaluation of patient's condition and participation in multidisciplinary rounds.   During this encounter critical care time was devoted to patient care services described in this note for 48 minutes.     Cheri Fowler, MD [place] [facility] See Amion for pager If no response to pager, please call [phone] until 7pm After 7pm, Please call E-link [phone]

## 2022-10-25 NOTE — Progress Notes (Addendum)
Pharmacy Antibiotic Note  Kayla [name] is a 82 y.o. female admitted on 10/25/2022 with sepsis.  Pharmacy has been consulted for cefepime and vancomycin dosing.  WBC 18.3, hypotherm to 80F, other labs in process  Using historical weight 101.4kg from 10/06/2022 per chart review.   Plan: Vancomycin 2000mg  IV x1 as load Vancomycin PRN thereafter (goal trough 15-59mcg/ml) Cefepime 2g IV q 24h F/u renal function, cultures, CBC      Temp (24hrs), Avg:96 F (35.6 C), Min:96 F (35.6 C), Max:96 F (35.6 C)  Recent Labs  Lab 10/25/22 0729 10/25/22 0730  WBC  --  18.3*  CREATININE 5.40*  --     CrCl cannot be calculated (Unknown ideal weight.).    Allergies  Allergen Reactions   Dulaglutide Nausea Only, Other (See Comments) and Cough    TRULICITY made the patient sick to her stomach (only) several days after using it   Oxycodone Itching and Other (See Comments)    Pt still takes this med even thought it causes itching   Trelegy Ellipta [Fluticasone-Umeclidin-Vilant] Nausea And Vomiting, Other (See Comments) and Cough    Made the patient sick to her stomach (only) several days after using it     Antimicrobials this admission: Vanc 6/23> Cefepime 6/23>  Dose adjustments this admission:   Microbiology results: 6/23 BCx:  6/23 MRSA:    Thank you for allowing pharmacy to be a part of this patient's care.  Calton Dach, PharmD Clinical Pharmacist 10/25/2022 8:13 AM   ---  Change cefepime > zosyn per MD Initiating CRRT  Zosyn 3.375g IV q6h  Change vancomycin to 1000 q24h

## 2022-10-25 NOTE — Progress Notes (Signed)
ABG drawn and Critical results given to MD.     10/25/22 07:45  Sample type  ARTERIAL  pH, Arterial  7.165 (LL)  pCO2 arterial  63.8 (H)  pO2, Arterial  371 (H)  TCO2  25  Acid-base deficit  6.0 (H)  Bicarbonate  23.4  O2 Saturation  100  Patient temperature  35.6 C

## 2022-10-25 NOTE — ED Provider Notes (Signed)
[name] Provider Note   CSN: [id number] Arrival date & time: 10/25/22  4098     History  Chief Complaint  Patient presents with   Respiratory Distress   Bradycardia    Pacing     Kayla [name] is a 82 y.o. female.  82 y.o. 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  Husband of patient called EMS after patient was noted to have respiratory distress this morning.  EMS reports that patient was minimally responsive and in respiratory distress.  BVM ventilation initiated by EMS.  EMS noted bradycardia down into the 30s.  Transcutaneous pacing started en route to ED.  No hypotension noted by EMS during transport.  On arrival patient is on 100% face mask.  No active ventilation assistance on arrival.  She is answering questions.  She is able to identify herself.  Heart rate is approximately 40.  Blood pressure is approximate 150 systolic.  DNR classification in EPIC from last admission, last month.  The history is provided by the patient, the EMS personnel and medical records.       Home Medications Prior to Admission medications   Medication Sig Start Date End Date Taking? Authorizing Provider  acetaminophen (TYLENOL) 500 MG tablet Take 1,000-1,500 mg by mouth every 8 (eight) hours as needed for mild pain, moderate pain or headache.    [provider]  albuterol (PROVENTIL) (2.5 MG/3ML) 0.083% nebulizer solution Take 3 mLs (2.5 mg total) by nebulization every 2 (two) hours as needed for wheezing or shortness of breath. 07/03/22   Parrett, Virgel Bouquet, NP  albuterol (VENTOLIN HFA) 108 (90 Base) MCG/ACT inhaler Inhale 2 puffs into the lungs every 6 (six) hours as needed for wheezing or shortness of breath. 07/03/22   Parrett, Virgel Bouquet, NP   Alcohol Swabs (B-D SINGLE USE SWABS REGULAR) PADS  11/24/19   [provider]  allopurinol (ZYLOPRIM) 100 MG tablet Take 100 mg by mouth 2 (two) times daily. 04/16/19   [provider]  amiodarone (PACERONE) 200 MG tablet Take 1 tablet (200 mg total) by mouth daily. 05/22/22   Jake Bathe, MD  amLODipine (NORVASC) 5 MG tablet Take 2 tablets (10 mg total) by mouth daily. 09/15/22   Pokhrel, Rebekah Chesterfield, MD  ascorbic acid (VITAMIN C) 500 MG tablet Take 500 mg by mouth daily with lunch.    [provider]  atorvastatin (LIPITOR) 40 MG tablet Take 1 tablet (40 mg total) by mouth daily. Patient taking differently: Take 40 mg by mouth at bedtime. 05/22/22   Jake Bathe, MD  Blood Glucose Calibration (TRUE METRIX LEVEL 1) Low SOLN  11/27/19   [provider]  Budeson-Glycopyrrol-Formoterol (BREZTRI AEROSPHERE) 160-9-4.8 MCG/ACT AERO Inhale 2 puffs into the lungs in the morning and at bedtime. 07/03/22   Parrett, Virgel Bouquet, NP  Carboxymethylcellulose Sodium (ARTIFICIAL TEARS OP) Place 1 drop into both eyes daily as needed (dry eyes).    [provider]  ELIQUIS 2.5 MG TABS tablet TAKE 1 TABLET TWICE DAILY 04/20/22   Jake Bathe, MD  ferrous sulfate 325 (65 FE) MG tablet Take 1 tablet (325 mg total) by mouth daily with breakfast. 01/26/20   Duke Salvia, MD  fluticasone (FLONASE) 50 MCG/ACT nasal spray USE 2 SPRAYS NASALLY DAILY  Patient taking differently: Place 1 spray into both nostrils daily as needed for allergies. USE 2 SPRAYS NASALLY DAILY 02/26/17   Leslye Peer, MD  gabapentin (NEURONTIN) 800 MG tablet Take 800 mg by mouth 3 (three) times daily.    [provider]  guaiFENesin (ROBITUSSIN) 100 MG/5ML liquid Take 5 mLs by mouth every 4 (four) hours as needed for cough or to loosen phlegm.    [provider]  HYDROcodone-acetaminophen (NORCO) 10-325 MG tablet Take 1 tablet by mouth every 6 (six) hours as needed. 09/15/22   Pokhrel, Rebekah Chesterfield,  MD  HYDROXYZINE HCL PO Take 25 mg by mouth at bedtime as needed (insomnia).    [provider]  insulin aspart (NOVOLOG) 100 UNIT/ML FlexPen Inject 0-20 Units into the skin See admin instructions. Inject 0-20 units into the skin three times a day before meals "as needed," per [facility] SCALE    [provider]  insulin glargine (LANTUS) 100 UNIT/ML injection Inject 0.55 mLs (55 Units total) into the skin 2 (two) times daily. Patient taking differently: Inject 25-50 Units into the skin See admin instructions. Inject 25 units subcutaneously daily in the morning. Inject 50 units subcutaneously daily at bedtime.  Patient report 90 units in the morning 25 units at bedtime, was adamant regarding dosage. 07/27/21   Regalado, Belkys A, MD  levothyroxine (SYNTHROID) 200 MCG tablet Take 200 mcg by mouth daily before breakfast.    [provider]  loratadine (CLARITIN) 10 MG tablet Take 1 tablet (10 mg total) by mouth daily. 09/16/22   Pokhrel, Rebekah Chesterfield, MD  losartan (COZAAR) 25 MG tablet Take 25 mg by mouth daily. 03/07/22   [provider]  melatonin 5 MG TABS Take 1 tablet (5 mg total) by mouth at bedtime as needed. 09/15/22   Pokhrel, Rebekah Chesterfield, MD  nitroGLYCERIN (NITROSTAT) 0.4 MG SL tablet Place 1 tablet (0.4 mg total) under the tongue every 5 (five) minutes as needed for chest pain. 04/13/19   Jake Bathe, MD  polyethylene glycol (MIRALAX / GLYCOLAX) 17 g packet Take 17 g by mouth daily as needed for mild constipation or moderate constipation. 09/15/22   Pokhrel, Rebekah Chesterfield, MD  PRESCRIPTION MEDICATION CPAP- At bedtime and as needed during any time of rest    [provider]  sennosides-docusate sodium (SENOKOT-S) 8.6-50 MG tablet Take 1 tablet by mouth daily.    [provider]  torsemide (DEMADEX) 20 MG tablet Take 2 tablets (40 mg total) by mouth daily. 09/15/22   Pokhrel, Rebekah Chesterfield, MD  TRUE METRIX BLOOD GLUCOSE TEST test strip  09/18/17   [provider]   TRUEplus Lancets 28G MISC  11/24/19   [provider]  vitamin B-12 (CYANOCOBALAMIN) 1000 MCG tablet Take 1,000 mcg by mouth daily with lunch.    [provider]  Vitamin D3 (VITAMIN D) 25 MCG tablet Take 1,000 Units by mouth daily with lunch.    [provider]      Allergies    Dulaglutide, Oxycodone, and Trelegy ellipta [fluticasone-umeclidin-vilant]    Review of Systems   Review of Systems  Unable to perform ROS: Acuity of condition    Physical Exam Updated Vital Signs BP (!) 161/58   Pulse (!) 36   Temp (!) 96 F (35.6 C) (Axillary)   Resp (!) 26   SpO2 100%  Physical Exam Vitals and nursing note reviewed.  Constitutional:      General: She is not in acute distress.    Appearance: She is well-developed.  Comments: Chronically ill in appearance, able to respond to verbal stimulation, able to supply her name when asked  Moves all 4 extremities  Mildly tachypneic  HENT:     Head: Normocephalic and atraumatic.  Eyes:     Conjunctiva/sclera: Conjunctivae normal.     Pupils: Pupils are equal, round, and reactive to light.  Cardiovascular:     Rate and Rhythm: Bradycardia present.     Heart sounds: Normal heart sounds.     Comments: Bradycardic, heart rate of approximately 40 Pulmonary:     Effort: No respiratory distress.     Comments: Mildly tachypneic, mild to moderate increased work of breathing, decreased breath sounds at bilateral bases, no wheezing Abdominal:     General: There is no distension.     Palpations: Abdomen is soft.     Tenderness: There is no abdominal tenderness.  Musculoskeletal:        General: No deformity. Normal range of motion.     Cervical back: Normal range of motion and neck supple.     Right lower leg: Edema present.     Left lower leg: Edema present.  Skin:    General: Skin is warm and dry.  Neurological:     General: No focal deficit present.     Comments: Alert to verbal stimulation, oriented to  person     ED Results / Procedures / Treatments   Labs (all labs ordered are listed, but only abnormal results are displayed) Labs Reviewed  CBC WITH DIFFERENTIAL/PLATELET - Abnormal; Notable for the following components:      Result Value   WBC 18.3 (*)    RBC 2.94 (*)    Hemoglobin 8.4 (*)    HCT 28.6 (*)    MCHC 29.4 (*)    RDW 16.6 (*)    Platelets 478 (*)    Neutro Abs 15.2 (*)    Monocytes Absolute 1.3 (*)    Abs Immature Granulocytes 0.18 (*)    All other components within normal limits  I-STAT CHEM 8, ED - Abnormal; Notable for the following components:   Sodium 133 (*)    Potassium 6.5 (*)    BUN >130 (*)    Creatinine, Ser 5.40 (*)    Glucose, Bld 198 (*)    TCO2 21 (*)    Hemoglobin 9.9 (*)    HCT 29.0 (*)    All other components within normal limits  I-STAT VENOUS BLOOD GAS, ED - Abnormal; Notable for the following components:   pH, Ven 7.240 (*)    pO2, Ven 46 (*)    Acid-base deficit 5.0 (*)    Sodium 133 (*)    Potassium 6.6 (*)    Calcium, Ion 1.13 (*)    HCT 27.0 (*)    Hemoglobin 9.2 (*)    All other components within normal limits  I-STAT ARTERIAL BLOOD GAS, ED - Abnormal; Notable for the following components:   pH, Arterial 7.165 (*)    pCO2 arterial 63.8 (*)    pO2, Arterial 371 (*)    Acid-base deficit 6.0 (*)    Sodium 133 (*)    Potassium 6.6 (*)    HCT 27.0 (*)    Hemoglobin 9.2 (*)    All other components within normal limits  CULTURE, BLOOD (ROUTINE X 2)  CULTURE, BLOOD (ROUTINE X 2)  MRSA NEXT GEN BY PCR, NASAL  BASIC METABOLIC PANEL  BRAIN NATRIURETIC PEPTIDE  LACTIC ACID, PLASMA  LACTIC ACID, PLASMA  TROPONIN I (  HIGH SENSITIVITY)    EKG None  Radiology DG Chest Portable 1 View  Result Date: 10/25/2022 CLINICAL DATA:  Respiratory distress. EXAM: PORTABLE CHEST 1 VIEW COMPARISON:  09/11/2022 FINDINGS: Rotated film. The cardio pericardial silhouette is enlarged. Bilateral diffuse, basilar predominant airspace disease is  stable to mildly progressive in the interval. Left lung base is not been included on the study. Bilateral pleural effusions suspected. Bones are diffusely demineralized. Telemetry leads overlie the chest. IMPRESSION: 1. Rotated film with bilateral diffuse, basilar predominant airspace disease, stable to mildly progressive in the interval. Imaging features likely reflect edema although diffuse infection could have this appearance. 2. Suspect bilateral pleural effusions. 3. Left lung base not included on the study. Electronically Signed   By: Kennith Center M.D.   On: 10/25/2022 07:48    Procedures Procedures    Medications Ordered in ED Medications  vancomycin (VANCOREADY) IVPB 2000 mg/400 mL (has no administration in time range)  ceFEPIme (MAXIPIME) 2 g in sodium chloride 0.9 % 100 mL IVPB (has no administration in time range)  ceFEPIme (MAXIPIME) 2 g in sodium chloride 0.9 % 100 mL IVPB (has no administration in time range)  vancomycin variable dose per unstable renal function (pharmacist dosing) (has no administration in time range)  calcium gluconate inj 10% (1 g) URGENT USE ONLY! (1 g Intravenous Given 10/25/22 0754)  sodium bicarbonate injection 50 mEq (50 mEq Intravenous Given 10/25/22 0749)  insulin aspart (novoLOG) injection 5 Units (5 Units Intravenous Given 10/25/22 0753)    And  dextrose 50 % solution 50 mL (50 mLs Intravenous Given 10/25/22 0751)    ED Course/ Medical Decision Making/ A&P                             Medical Decision Making Amount and/or Complexity of Data Reviewed Labs: ordered. Radiology: ordered.  Risk OTC drugs. Prescription drug management. Decision regarding hospitalization.    Medical Screen Complete  This patient presented to the ED with complaint of respiratory distress, AMS, bradycardia.  This complaint involves an extensive number of treatment options. The initial differential diagnosis includes, but is not limited to, metabolic abnormality,  ACS, symptomatic bradycardia, infection, etc.  This presentation is: Acute, Chronic, Self-Limited, Previously Undiagnosed, Uncertain Prognosis, Complicated, Systemic Symptoms, and Threat to Life/Bodily Function  Patient with multiple comorbidities and recent complicated course requiring admission and removal of PPM secondary to MRSA bacteremia and osteomyelitis of the left foot.  Per husband patient completed outpatient course of IV daptomycin this past week.  PICC line was removed this past week.  Over the last 24 hours patient with decreased urine output and mildly increased difficulty breathing.  Symptoms were singly worse this morning.  Husband confirms patient's DNR/DNI status.  On arrival to ED, patient placed on BiPAP out of concern for work of breathing.  Bradycardia of 48 noted.  This appears to be patient's baseline given recent cardiology notes.  BP is preserved with heart rate of 40 and systolic blood pressure of approximately 150.  Initial i-STAT labs are suggestive of acute renal failure with elevated potassium at 6.6.  Treatment of hyperkalemia initiated in ED.  Broad-spectrum antibiotics administered out of concern for possible occult infection.  Cardiology made aware of case.  Nephrology made aware of case.  Dr. Ronalee Belts has seen patient.  Patient would benefit from CRRT - patient's husband consents to same.  Dr. Ronalee Belts request ICU admission for initiation of CRRT.  Patient will  require admission for further treatment.    Additional history obtained:  Additional history obtained from EMS and Spouse External records from outside sources obtained and reviewed including prior ED visits and prior Inpatient records.    Lab Tests:  I ordered and personally interpreted labs.     Imaging Studies ordered:  I ordered imaging studies including CXR  I independently visualized and interpreted obtained imaging which showed likely component of CHF I agree with the  radiologist interpretation.   Cardiac Monitoring:  The patient was maintained on a cardiac monitor.  I personally viewed and interpreted the cardiac monitor which showed an underlying rhythm of: Bradycardia   Medicines ordered:  I ordered medication including for hyperkalemia Reevaluation of the patient after these medicines showed that the patient: stayed the same   Problem List / ED Course:  AMS, Uremia, Bradycardia, AKI   Reevaluation:  After the interventions noted above, I reevaluated the patient and found that they have: stayed the same  Disposition:  After consideration of the diagnostic results and the patients response to treatment, I feel that the patent would benefit from admission.    CRITICAL CARE Performed by: Wynetta Fines   Total critical care time: 30 minutes  Critical care time was exclusive of separately billable procedures and treating other patients.  Critical care was necessary to treat or prevent imminent or life-threatening deterioration.  Critical care was time spent personally by me on the following activities: development of treatment plan with patient and/or surrogate as well as nursing, discussions with consultants, evaluation of patient's response to treatment, examination of patient, obtaining history from patient or surrogate, ordering and performing treatments and interventions, ordering and review of laboratory studies, ordering and review of radiographic studies, pulse oximetry and re-evaluation of patient's condition.         Final Clinical Impression(s) / ED Diagnoses Final diagnoses:  Bradycardia  Hyperkalemia  AKI (acute kidney injury) [facility])    Rx / DC Orders ED Discharge Orders     None         Wynetta Fines, MD 10/25/22 1032

## 2022-10-25 NOTE — Procedures (Signed)
Arterial Catheter Insertion Procedure Note  Kayla [name]  [id number]  Aug 09, 1940  Date:10/25/22  Time:12:28 PM    Provider Performing: Cheri Fowler    Procedure: Insertion of Arterial Line (40981) with US guidance (19147)   Indication(s) Blood pressure monitoring and/or need for frequent ABGs  Consent Risks of the procedure as well as the alternatives and risks of each were explained to the patient and/or caregiver.  Consent for the procedure was obtained and is signed in the bedside chart  Anesthesia None   Time Out Verified patient identification, verified procedure, site/side was marked, verified correct patient position, special equipment/implants available, medications/allergies/relevant history reviewed, required imaging and test results available.   Sterile Technique Maximal sterile technique including full sterile barrier drape, hand hygiene, sterile gown, sterile gloves, mask, hair covering, sterile ultrasound probe cover (if used).   Procedure Description Area of catheter insertion was cleaned with chlorhexidine and draped in sterile fashion. With real-time ultrasound guidance an arterial catheter was placed into the right  Axillary  artery.  Appropriate arterial tracings confirmed on monitor.     Complications/Tolerance None; patient tolerated the procedure well.   EBL Minimal   Specimen(s) None

## 2022-10-25 NOTE — Progress Notes (Signed)
Echocardiogram 2D Echocardiogram has been performed.  Warren Lacy Chaska Hagger RDCS 10/25/2022, 11:27 AM

## 2022-10-25 NOTE — Consult Note (Addendum)
[facility] Nephrology Consultation Note  Requesting MD: Dr. Kristine Royal Reason for consult: AKI, hyperkalemia  HPI:  Kayla [name] is a 82 y.o. female with past medical history significant for hypertension, HLD, CAD status post cath, CHF, type II DM, A-fib, sick sinus syndrome status post pacemaker, anemia, COPD, peripheral vascular disease, presented with acute respiratory distress seen as a consultation for AKI on CKD and hyperkalemia. The patient was recently admitted from 5/2 - 5/14 in the setting of MRSA bacteremia with osteomyelitis of her left foot.  TEE showing thickened under RV lead, had pacemaker extraction on 5/7.  The beta-blocker was held.  The patient was subsequently discharged to SNF for 6 weeks of IV daptomycin and completed antibiotics recently. She was admitted to local hospital in [place] for respite distress last week when she was diagnosed with pneumonia. She continued to have respiratory distress associated with worsening lower extremity edema, weakness and decreased urine output.  The husband called EMS because of shortness of breath and minimally responsive.  The EMS initiated bag valve mask ventilation and noted bradycardia with heart rate in 30s.   In the ER, she was initially put on 100% oxygen via nonrebreather and later changed to BiPAP.  The labs showed sodium 133, potassium level 6.5, BUN more than 130, creatinine level 5.4, BNP 819, hemoglobin 9.9. She received calcium gluconate, albuterol, furosemide 80 mg IV, insulin without much improvement of urine output.  Also getting treatment cefepime and vancomycin. She is somnolent and lethargic.  Her husband was present at the bedside who provided most of the history.  Apparently patient had body tremor yesterday and gradually becoming more lethargic.  The diuretics dose was recently increased by her PCP without much help. Patient is currently lethargic, somnolent, on BiPAP.  She has significant  peripheral edema.  I have discussed with patient chest pain, ER physician and ICU physician at length.  PMHx:   Past Medical History:  Diagnosis Date   Allergy    Anxiety    Arthritis    CAD (coronary artery disease)    Mild per remote cath in 2006, /    Nuclear, January, 2013, no significant abnormality,   Carotid artery disease (HCC)    Doppler, January, 2013, 0 39% RIC A., 40-59% LICA, stable   Cataract    CHF (congestive heart failure) (HCC)    Chronic kidney disease    Complication of anesthesia    wakes up "mean"   COPD 02/16/2007   Intolerant of Spiriva, tudorza Intolerant of Trelegy    COPD (chronic obstructive pulmonary disease) (HCC)    Coronary atherosclerosis 11/24/2008   Catheterization 2006, nonobstructive coronary disease   //   nuclear 2013, low risk     Diabetes mellitus, type 2 (HCC)    Diverticula of colon    DIVERTICULOSIS OF COLON 03/23/2007   Qualifier: Diagnosis of  By: Craige Cotta MD, Vineet     Dizziness    occasional   Dizzy spells 05/04/2011   DM (diabetes mellitus) (HCC) 03/23/2007   Qualifier: Diagnosis of  By: Craige Cotta MD, Vineet     Dyspnea    On exertion   Ejection fraction    Essential hypertension 02/16/2007   Qualifier: Diagnosis of  By: Earlene Plater CMA, Tammy     G E R D 03/23/2007   Qualifier: Diagnosis of  By: Craige Cotta MD, Vineet     Gall bladder disease 04/28/2016   Gastritis and gastroduodenitis    GERD (gastroesophageal reflux disease)  Goiter    hx of   Headache(784.0)    migraine   Hyperlipidemia    Hypertension    Hypothyroidism    HYPOTHYROIDISM, POSTSURGICAL 06/04/2008   Qualifier: Diagnosis of  By: Everardo All MD, Sean A    Left ventricular hypertrophy    Leg edema    occasional swelling   Leg swelling 12/09/2018   Myofascial pain syndrome, cervical 01/06/2018   Obesity    OBESITY 11/24/2008   Qualifier: Diagnosis of  By: Vikki Ports     OBSTRUCTIVE SLEEP APNEA 02/16/2007   Auto CPAP 09/03/13 to 10/02/13 >> used on 30 of 30 nights  with average 6 hrs and 3 min.  Average AHI is 3.1 with median CPAP 5 cm H2O and 95 th percentile CPAP 6 cm H2O.     Occipital neuralgia of right side 01/06/2018   OSA (obstructive sleep apnea)    cpap setting of 12   Pain in joint of right hip 01/21/2018   Palpitations 01/21/2011   48 hour Holter, 2015, scattered PACs and PVCs.    Paroxysmal atrial fibrillation (HCC)    Pneumonia    PONV (postoperative nausea and vomiting)    severe after every surgery   Pulmonary hypertension, secondary    RHINITIS 07/21/2010        RUQ abdominal pain    S/P left TKA 07/27/2011   Sinus node dysfunction (HCC) 11/08/2015   Sleep apnea    Spinal stenosis of cervical region 11/07/2014   Urinary frequency 12/09/2018   UTI (urinary tract infection) 08/15/2019   per pt report   VENTRICULAR HYPERTROPHY, LEFT 11/24/2008   Qualifier: Diagnosis of  By: Vikki Ports      Past Surgical History:  Procedure Laterality Date   ABDOMINAL HYSTERECTOMY     with appendectomy and colon polypectomy   AMPUTATION TOE Left 12/15/2019   Procedure: AMPUTATION  LEFT GREAT TOE;  Surgeon: Park Liter, DPM;  Location: [facility];  Service: Podiatry;  Laterality: Left;   AMPUTATION TOE Left 01/22/2022   Procedure: AMPUTATION TOE 4th digit;  Surgeon: Candelaria Stagers, DPM;  Location: [facility];  Service: Podiatry;  Laterality: Left;   AMPUTATION TOE Left 09/04/2022   Procedure: LEFT BIG TOE AMPUTAION;  Surgeon: Vivi Barrack, DPM;  Location: [facility];  Service: Podiatry;  Laterality: Left;   ANTERIOR CERVICAL DECOMP/DISCECTOMY FUSION N/A 11/07/2014   Procedure: Anterior Cervical diskectomy and fusion Cervical four-five, Cervical five-six, Cervical six-seven;  Surgeon: Donalee Citrin, MD;  Location: [facility];  Service: Neurosurgery;  Laterality: N/A;   APPENDECTOMY     ARTERY BIOPSY Right 02/16/2017   Procedure: BIOPSY TEMPORAL ARTERY;  Surgeon: Renford Dills, MD;  Location: [facility];  Service: Vascular;  Laterality: Right;    ARTERY BIOPSY Left 04/16/2020   Procedure: BIOPSY TEMPORAL ARTERY;  Surgeon: Renford Dills, MD;  Location: [facility];  Service: Vascular;  Laterality: Left;  LEFT TEMPLE   BACK SURGERY     CARDIAC CATHETERIZATION     CATARACT EXTRACTION W/ INTRAOCULAR LENS  IMPLANT, BILATERAL Bilateral    CHOLECYSTECTOMY N/A 04/29/2016   Procedure: LAPAROSCOPIC CHOLECYSTECTOMY WITH INTRAOPERATIVE CHOLANGIOGRAM;  Surgeon: Ovidio Kin, MD;  Location: [facility];  Service: General;  Laterality: N/A;   EP IMPLANTABLE DEVICE N/A 11/08/2015   MRI COMPATABLE -- Procedure: Pacemaker Implant;  Surgeon: Duke Salvia, MD;  Location: [facility];  Service: Cardiovascular;  Laterality: N/A;   INCISION AND DRAINAGE ABSCESS Right 05/30/2021   Procedure: IRRIGATION AND  DEBRIDEMENT OF PREPATELLA BURSA;  Surgeon: Durene Romans, MD;  Location: [facility];  Service: Orthopedics;  Laterality: Right;  60 wants standard knee draping and pulse lavage   INCISION AND DRAINAGE ABSCESS Right 07/22/2021   Procedure: INCISION AND DRAINAGE ABSCESS RIGHT KNEE;  Surgeon: Durene Romans, MD;  Location: [facility];  Service: Orthopedics;  Laterality: Right;   INSERT / REPLACE / REMOVE PACEMAKER  2017   Dr. Clide Cliff [facility])   IR FLUORO GUIDE CV LINE RIGHT  07/25/2021   IR REMOVAL TUN CV CATH W/O FL  09/04/2021   IR US GUIDE VASC ACCESS RIGHT  07/25/2021   JOINT REPLACEMENT     LEAD EXTRACTION N/A 09/08/2022   Procedure: LEAD EXTRACTION;  Surgeon: Marinus Maw, MD;  Location: [facility];  Service: Cardiovascular;  Laterality: N/A;   LEAD EXTRACTION N/A 09/11/2022   Procedure: LEAD EXTRACTION;  Surgeon: Marinus Maw, MD;  Location: [facility];  Service: Cardiovascular;  Laterality: N/A;   LUMBAR DISC SURGERY  1990's   x 2   TEE WITHOUT CARDIOVERSION N/A 09/07/2022   Procedure: TRANSESOPHAGEAL ECHOCARDIOGRAM;  Surgeon: Christell Constant, MD;  Location: [facility];  Service: Cardiovascular;  Laterality: N/A;    THYROIDECTOMY  2011   TOTAL KNEE ARTHROPLASTY  07/27/2011   Procedure: TOTAL KNEE ARTHROPLASTY;  Surgeon: Shelda Pal, MD;  Location: [facility];  Service: Orthopedics;  Laterality: Left;   UPPER ESOPHAGEAL ENDOSCOPIC ULTRASOUND (EUS) N/A 05/14/2016   Procedure: UPPER ESOPHAGEAL ENDOSCOPIC ULTRASOUND (EUS);  Surgeon: Rachael Fee, MD;  Location: [facility];  Service: Endoscopy;  Laterality: N/A;  radial only-will be done mod if needed    Family Hx:  Family History  Problem Relation Age of Onset   Heart Problems Mother    Heart defect Mother        valve problems   Heart Problems Father    Heart disease Brother    Lung cancer Daughter        non small cell    Diabetes Maternal Aunt    Diabetes Maternal Uncle    Throat cancer Brother    Colon cancer Neg Hx    Colon polyps Neg Hx    Esophageal cancer Neg Hx    Rectal cancer Neg Hx    Stomach cancer Neg Hx     Social History:  reports that she quit smoking about 25 years ago. Her smoking use included cigarettes. She started smoking about 58 years ago. She has a 66.00 pack-year smoking history. She has never used smokeless tobacco. She reports that she does not drink alcohol and does not use drugs.  Allergies:  Allergies  Allergen Reactions   Dulaglutide Nausea Only, Other (See Comments) and Cough    TRULICITY made the patient sick to her stomach (only) several days after using it   Oxycodone Itching and Other (See Comments)    Pt still takes this med even thought it causes itching   Trelegy Ellipta [Fluticasone-Umeclidin-Vilant] Nausea And Vomiting, Other (See Comments) and Cough    Made the patient sick to her stomach (only) several days after using it     Medications: Prior to Admission medications   Medication Sig Start Date End Date Taking? Authorizing Provider  acetaminophen (TYLENOL) 500 MG tablet Take 1,000-1,500 mg by mouth every 8 (eight) hours as needed for mild pain, moderate pain or headache.    [provider]  albuterol (PROVENTIL) (2.5 MG/3ML) 0.083% nebulizer solution Take 3  mLs (2.5 mg total) by nebulization every 2 (two) hours as needed for wheezing or shortness of breath. 07/03/22   Parrett, Virgel Bouquet, NP  albuterol (VENTOLIN HFA) 108 (90 Base) MCG/ACT inhaler Inhale 2 puffs into the lungs every 6 (six) hours as needed for wheezing or shortness of breath. 07/03/22   Parrett, Virgel Bouquet, NP  Alcohol Swabs (B-D SINGLE USE SWABS REGULAR) PADS  11/24/19   [provider]  allopurinol (ZYLOPRIM) 100 MG tablet Take 100 mg by mouth 2 (two) times daily. 04/16/19   [provider]  amiodarone (PACERONE) 200 MG tablet Take 1 tablet (200 mg total) by mouth daily. 05/22/22   Jake Bathe, MD  amLODipine (NORVASC) 5 MG tablet Take 2 tablets (10 mg total) by mouth daily. 09/15/22   Pokhrel, Rebekah Chesterfield, MD  ascorbic acid (VITAMIN C) 500 MG tablet Take 500 mg by mouth daily with lunch.    [provider]  atorvastatin (LIPITOR) 40 MG tablet Take 1 tablet (40 mg total) by mouth daily. Patient taking differently: Take 40 mg by mouth at bedtime. 05/22/22   Jake Bathe, MD  Blood Glucose Calibration (TRUE METRIX LEVEL 1) Low SOLN  11/27/19   [provider]  Budeson-Glycopyrrol-Formoterol (BREZTRI AEROSPHERE) 160-9-4.8 MCG/ACT AERO Inhale 2 puffs into the lungs in the morning and at bedtime. 07/03/22   Parrett, Virgel Bouquet, NP  Carboxymethylcellulose Sodium (ARTIFICIAL TEARS OP) Place 1 drop into both eyes daily as needed (dry eyes).    [provider]  ELIQUIS 2.5 MG TABS tablet TAKE 1 TABLET TWICE DAILY 04/20/22   Jake Bathe, MD  ferrous sulfate 325 (65 FE) MG tablet Take 1 tablet (325 mg total) by mouth daily with breakfast. 01/26/20   Duke Salvia, MD  fluticasone (FLONASE) 50 MCG/ACT nasal spray USE 2 SPRAYS NASALLY DAILY Patient taking differently: Place 1 spray into both nostrils daily as needed for allergies. USE 2 SPRAYS NASALLY DAILY 02/26/17   Leslye Peer, MD   gabapentin (NEURONTIN) 800 MG tablet Take 800 mg by mouth 3 (three) times daily.    [provider]  guaiFENesin (ROBITUSSIN) 100 MG/5ML liquid Take 5 mLs by mouth every 4 (four) hours as needed for cough or to loosen phlegm.    [provider]  HYDROcodone-acetaminophen (NORCO) 10-325 MG tablet Take 1 tablet by mouth every 6 (six) hours as needed. 09/15/22   Pokhrel, Rebekah Chesterfield, MD  HYDROXYZINE HCL PO Take 25 mg by mouth at bedtime as needed (insomnia).    [provider]  insulin aspart (NOVOLOG) 100 UNIT/ML FlexPen Inject 0-20 Units into the skin See admin instructions. Inject 0-20 units into the skin three times a day before meals "as needed," per [facility] SCALE    [provider]  insulin glargine (LANTUS) 100 UNIT/ML injection Inject 0.55 mLs (55 Units total) into the skin 2 (two) times daily. Patient taking differently: Inject 25-50 Units into the skin See admin instructions. Inject 25 units subcutaneously daily in the morning. Inject 50 units subcutaneously daily at bedtime.  Patient report 90 units in the morning 25 units at bedtime, was adamant regarding dosage. 07/27/21   Regalado, Belkys A, MD  levothyroxine (SYNTHROID) 200 MCG tablet Take 200 mcg by mouth daily before breakfast.    [provider]  loratadine (CLARITIN) 10 MG tablet Take 1 tablet (10 mg total) by mouth daily. 09/16/22   Pokhrel, Rebekah Chesterfield, MD  losartan (COZAAR) 25 MG tablet Take 25 mg by mouth daily. 03/07/22  [provider]  melatonin 5 MG TABS Take 1 tablet (5 mg total) by mouth at bedtime as needed. 09/15/22   Pokhrel, Rebekah Chesterfield, MD  nitroGLYCERIN (NITROSTAT) 0.4 MG SL tablet Place 1 tablet (0.4 mg total) under the tongue every 5 (five) minutes as needed for chest pain. 04/13/19   Jake Bathe, MD  polyethylene glycol (MIRALAX / GLYCOLAX) 17 g packet Take 17 g by mouth daily as needed for mild constipation or moderate constipation. 09/15/22   Pokhrel, Rebekah Chesterfield, MD  PRESCRIPTION  MEDICATION CPAP- At bedtime and as needed during any time of rest    [provider]  sennosides-docusate sodium (SENOKOT-S) 8.6-50 MG tablet Take 1 tablet by mouth daily.    [provider]  torsemide (DEMADEX) 20 MG tablet Take 2 tablets (40 mg total) by mouth daily. 09/15/22   Pokhrel, Rebekah Chesterfield, MD  TRUE METRIX BLOOD GLUCOSE TEST test strip  09/18/17   [provider]  TRUEplus Lancets 28G MISC  11/24/19   [provider]  vitamin B-12 (CYANOCOBALAMIN) 1000 MCG tablet Take 1,000 mcg by mouth daily with lunch.    [provider]  Vitamin D3 (VITAMIN D) 25 MCG tablet Take 1,000 Units by mouth daily with lunch.    [provider]    I have reviewed the patient's current medications.  Labs: Renal Panel: Recent Labs  Lab 10/25/22 0729 10/25/22 0730 10/25/22 0745  NA 133*  133* 134* 133*  K 6.5*  6.6* 6.6* 6.6*  CL 105 99  --   CO2  --  19*  --   GLUCOSE 198* 207*  --   BUN >130* 120*  --   CREATININE 5.40* 5.06*  --   CALCIUM  --  8.9  --      CBC:    Latest Ref Rng & Units 10/25/2022    7:45 AM 10/25/2022    7:30 AM 10/25/2022    7:29 AM  CBC  WBC 4.0 - 10.5 K/uL  18.3    Hemoglobin 12.0 - 15.0 g/dL 9.2  8.4  9.9    9.2   Hematocrit 36.0 - 46.0 % 27.0  28.6  29.0    27.0   Platelets 150 - 400 K/uL  478       Anemia Panel:  Recent Labs    10/01/22 0900 10/07/22 0939 10/25/22 0729 10/25/22 0730 10/25/22 0745  HGB 8.5* 8.9* 9.9*  9.2* 8.4* 9.2*  MCV 96.5 90.9  --  97.3  --     No results for input(s): "AST", "ALT", "ALKPHOS", "BILITOT", "PROT", "ALBUMIN" in the last 168 hours.  Lab Results  Component Value Date   HGBA1C 6.8 (H) 09/04/2022    ROS: Patient is lethargic and uremic therefore unable to obtain review of system.  Physical Exam: Vitals:   10/25/22 0915 10/25/22 0930  BP: (!) 139/55 (!) 120/52  Pulse: (!) 33 (!) 32  Resp: 14 14  Temp:    SpO2: 99% 100%     General exam: Critically ill  looking female, on BiPAP, lethargic Respiratory system: Coarse breath sound bilateral, Cardiovascular system: Bradycardic, anasarca with peripheral edema ++ gastrointestinal system: Abdomen is distended, soft  Central nervous system: Encephalopathy, lethargic. Extremities: b/l LE pitting edema+++, superficially skin breakdown Skin: Lower extremities skin lesions consistent with edema. Psychiatry: Lethargic, somnolent.  Assessment/Plan:  # Acute kidney injury on CKD stage IV likely due to CHF exacerbation/fluid overload/bradycardia leading to reduced renal perfusion.  She has hyperkalemia, anasarca and not responding with  IV diuretics.  Also features of uremic encephalopathy including tremor, AMS. I had a long discussion with the patient's husband about treatment plan including the benefit of dialysis especially CRRT.  He agreed to pursue dialysis after catheter placement.  He understands that patient may not tolerate long-term dialysis. Recommend to admit in ICU, start CRRT.  Discussed with EDP and ICU physician. Pre and post filter 2K and dialysate 4k Heparin fixed dose for anticoagulation UF around 50-100 cc/hr.  # Hyperkalemia: Received medical treatment in the ER.  Starting dialysis.  Monitor lab.  # Bradycardia: Seen by cardiologist.  The heart rate is down in 30s.  May need dopamine, defer to cardiology team.  # Acute respiratory failure/respiratory distress with hypoxia: Currently on BiPAP.  Hopefully we can adjust volume/ultrafiltration with CRRT.  She is being admitted to ICU.  # Sepsis due to possible pneumonia: On antibiotics per primary team.  Thank you for the consult.  We will follow with you.  Solana Coggin Jaynie Collins 10/25/2022, 10:00 AM  [facility].

## 2022-10-25 NOTE — Progress Notes (Signed)
RT called to bedside for respiratory distress and high O2 demands. Pt placed on bipap per MD/order. Pt tolerating it well at this time. RT will continue to monitor.     10/25/22 0743  BiPAP/CPAP/SIPAP  $ Non-Invasive Ventilator  Non-Invasive Vent Set Up;Non-Invasive Vent Initial  $ Face Mask Medium Yes  BiPAP/CPAP/SIPAP Pt Type Adult  BiPAP/CPAP/SIPAP SERVO  Mask Type Full face mask  Mask Size Medium  Set Rate (S)  22 breaths/min (ABG)  Respiratory Rate 26 breaths/min  IPAP 10 cmH20  EPAP 5 cmH2O  FiO2 (%) (S)  50 % (ABG)  Minute Ventilation 9.1  Leak 39  Peak Inspiratory Pressure (PIP) 15  Tidal Volume (Vt) 556  Patient Home Equipment No  Auto Titrate No  Press High Alarm 25 cmH2O  Press Low Alarm 5 cmH2O  Oxygen Percent 100 %

## 2022-10-25 NOTE — ED Notes (Signed)
Bair hugger applied.

## 2022-10-26 DIAGNOSIS — E875 Hyperkalemia: Secondary | ICD-10-CM | POA: Diagnosis not present

## 2022-10-26 DIAGNOSIS — J9601 Acute respiratory failure with hypoxia: Secondary | ICD-10-CM | POA: Diagnosis not present

## 2022-10-26 DIAGNOSIS — R001 Bradycardia, unspecified: Secondary | ICD-10-CM | POA: Diagnosis not present

## 2022-10-26 DIAGNOSIS — N179 Acute kidney failure, unspecified: Secondary | ICD-10-CM | POA: Diagnosis not present

## 2022-10-26 LAB — POCT I-STAT 7, (LYTES, BLD GAS, ICA,H+H)
Acid-Base Excess: 1 mmol/L (ref 0.0–2.0)
Acid-Base Excess: 1 mmol/L (ref 0.0–2.0)
Bicarbonate: 27.5 mmol/L (ref 20.0–28.0)
Bicarbonate: 26 mmol/L (ref 20.0–28.0)
Calcium, Ion: 1.18 mmol/L (ref 1.15–1.40)
Calcium, Ion: 1.1 mmol/L — ABNORMAL LOW (ref 1.15–1.40)
HCT: 26 % — ABNORMAL LOW (ref 36.0–46.0)
HCT: 26 % — ABNORMAL LOW (ref 36.0–46.0)
Hemoglobin: 8.8 g/dL — ABNORMAL LOW (ref 12.0–15.0)
Hemoglobin: 8.8 g/dL — ABNORMAL LOW (ref 12.0–15.0)
O2 Saturation: 96 %
O2 Saturation: 99 %
Patient temperature: 36.6
Potassium: 4.8 mmol/L (ref 3.5–5.1)
Potassium: 4.9 mmol/L (ref 3.5–5.1)
Sodium: 137 mmol/L (ref 135–145)
Sodium: 134 mmol/L — ABNORMAL LOW (ref 135–145)
TCO2: 29 mmol/L (ref 22–32)
TCO2: 27 mmol/L (ref 22–32)
pCO2 arterial: 54.7 mmHg — ABNORMAL HIGH (ref 32–48)
pCO2 arterial: 42.6 mmHg (ref 32–48)
pH, Arterial: 7.308 — ABNORMAL LOW (ref 7.35–7.45)
pH, Arterial: 7.394 (ref 7.35–7.45)
pO2, Arterial: 92 mmHg (ref 83–108)
pO2, Arterial: 137 mmHg — ABNORMAL HIGH (ref 83–108)

## 2022-10-26 LAB — COMPREHENSIVE METABOLIC PANEL
ALT: 18 U/L (ref 0–44)
AST: 14 U/L — ABNORMAL LOW (ref 15–41)
Albumin: 2.2 g/dL — ABNORMAL LOW (ref 3.5–5.0)
Alkaline Phosphatase: 49 U/L (ref 38–126)
Anion gap: 10 (ref 5–15)
BUN: 63 mg/dL — ABNORMAL HIGH (ref 8–23)
CO2: 22 mmol/L (ref 22–32)
Calcium: 7.6 mg/dL — ABNORMAL LOW (ref 8.9–10.3)
Chloride: 103 mmol/L (ref 98–111)
Creatinine, Ser: 2.73 mg/dL — ABNORMAL HIGH (ref 0.44–1.00)
GFR, Estimated: 17 mL/min — ABNORMAL LOW (ref >60)
Glucose, Bld: 73 mg/dL (ref 70–99)
Potassium: 4.5 mmol/L (ref 3.5–5.1)
Sodium: 135 mmol/L (ref 135–145)
Total Bilirubin: 0.5 mg/dL (ref 0.3–1.2)
Total Protein: 6 g/dL — ABNORMAL LOW (ref 6.5–8.1)

## 2022-10-26 LAB — CBC
HCT: 27.3 % — ABNORMAL LOW (ref 36.0–46.0)
Hemoglobin: 8.1 g/dL — ABNORMAL LOW (ref 12.0–15.0)
MCH: 27.9 pg (ref 26.0–34.0)
MCHC: 29.7 g/dL — ABNORMAL LOW (ref 30.0–36.0)
MCV: 94.1 fL (ref 80.0–100.0)
Platelets: 422 10*3/uL — ABNORMAL HIGH (ref 150–400)
RBC: 2.9 MIL/uL — ABNORMAL LOW (ref 3.87–5.11)
RDW: 16.7 % — ABNORMAL HIGH (ref 11.5–15.5)
WBC: 11.6 10*3/uL — ABNORMAL HIGH (ref 4.0–10.5)
nRBC: 0.2 % (ref 0.0–0.2)

## 2022-10-26 LAB — MAGNESIUM: Magnesium: 2.1 mg/dL (ref 1.7–2.4)

## 2022-10-26 LAB — HEPARIN LEVEL (UNFRACTIONATED): Heparin Unfractionated: >1.1 IU/mL — ABNORMAL HIGH (ref 0.30–0.70)

## 2022-10-26 LAB — RENAL FUNCTION PANEL
Albumin: 2.1 g/dL — ABNORMAL LOW (ref 3.5–5.0)
Anion gap: 7 (ref 5–15)
BUN: 47 mg/dL — ABNORMAL HIGH (ref 8–23)
CO2: 26 mmol/L (ref 22–32)
Calcium: 7.8 mg/dL — ABNORMAL LOW (ref 8.9–10.3)
Chloride: 100 mmol/L (ref 98–111)
Creatinine, Ser: 2.34 mg/dL — ABNORMAL HIGH (ref 0.44–1.00)
GFR, Estimated: 20 mL/min — ABNORMAL LOW (ref >60)
Glucose, Bld: 167 mg/dL — ABNORMAL HIGH (ref 70–99)
Phosphorus: 3.7 mg/dL (ref 2.5–4.6)
Potassium: 4.4 mmol/L (ref 3.5–5.1)
Sodium: 133 mmol/L — ABNORMAL LOW (ref 135–145)

## 2022-10-26 LAB — APTT
aPTT: 29 seconds (ref 24–36)
aPTT: 42 seconds — ABNORMAL HIGH (ref 24–36)

## 2022-10-26 LAB — PHOSPHORUS: Phosphorus: 3.9 mg/dL (ref 2.5–4.6)

## 2022-10-26 LAB — GLUCOSE, CAPILLARY
Glucose-Capillary: 100 mg/dL — ABNORMAL HIGH (ref 70–99)
Glucose-Capillary: 108 mg/dL — ABNORMAL HIGH (ref 70–99)
Glucose-Capillary: 122 mg/dL — ABNORMAL HIGH (ref 70–99)
Glucose-Capillary: 162 mg/dL — ABNORMAL HIGH (ref 70–99)
Glucose-Capillary: 162 mg/dL — ABNORMAL HIGH (ref 70–99)
Glucose-Capillary: 58 mg/dL — ABNORMAL LOW (ref 70–99)
Glucose-Capillary: 61 mg/dL — ABNORMAL LOW (ref 70–99)
Glucose-Capillary: 73 mg/dL (ref 70–99)
Glucose-Capillary: 88 mg/dL (ref 70–99)
Glucose-Capillary: 97 mg/dL (ref 70–99)

## 2022-10-26 MED ORDER — FUROSEMIDE 10 MG/ML IJ SOLN
INTRAMUSCULAR | Status: AC
  Filled 2022-10-26: qty 6

## 2022-10-26 MED ORDER — CLONIDINE HCL 0.2 MG/24HR TD PTWK
0.2000 mg | MEDICATED_PATCH | TRANSDERMAL | Status: DC
  Administered 2022-10-26: 0.2 mg via TRANSDERMAL
  Filled 2022-10-26: qty 1

## 2022-10-26 MED ORDER — DEXTROSE 10 % IV SOLN: INTRAVENOUS | Status: DC

## 2022-10-26 MED ORDER — DEXTROSE 50 % IV SOLN
25.0000 mL | Freq: Once | INTRAVENOUS | Status: AC
  Administered 2022-10-26: 25 mL via INTRAVENOUS
  Filled 2022-10-26: qty 50

## 2022-10-26 MED ORDER — LORAZEPAM 2 MG/ML IJ SOLN
INTRAMUSCULAR | Status: AC
  Administered 2022-10-26: 1 mg via INTRAVENOUS
  Filled 2022-10-26: qty 1

## 2022-10-26 MED ORDER — HYDRALAZINE HCL 20 MG/ML IJ SOLN
10.0000 mg | Freq: Once | INTRAMUSCULAR | Status: AC
  Administered 2022-10-26: 10 mg via INTRAVENOUS

## 2022-10-26 MED ORDER — MORPHINE SULFATE (PF) 2 MG/ML IV SOLN
INTRAVENOUS | Status: AC
  Filled 2022-10-26: qty 1

## 2022-10-26 MED ORDER — HEPARIN (PORCINE) 25000 UT/250ML-% IV SOLN
1400.0000 [IU]/h | INTRAVENOUS | Status: DC
  Administered 2022-10-26: 1100 [IU]/h via INTRAVENOUS
  Administered 2022-10-27 – 2022-10-28 (×3): 1400 [IU]/h via INTRAVENOUS
  Filled 2022-10-26 (×4): qty 250

## 2022-10-26 MED ORDER — HYDRALAZINE HCL 20 MG/ML IJ SOLN
10.0000 mg | Freq: Four times a day (QID) | INTRAMUSCULAR | Status: DC | PRN
  Administered 2022-10-26 – 2022-10-27 (×2): 10 mg via INTRAVENOUS
  Filled 2022-10-26 (×2): qty 1

## 2022-10-26 MED ORDER — MORPHINE SULFATE (PF) 2 MG/ML IV SOLN
2.0000 mg | Freq: Once | INTRAVENOUS | Status: AC
  Administered 2022-10-26: 2 mg via INTRAVENOUS
  Filled 2022-10-26: qty 1

## 2022-10-26 MED ORDER — NICARDIPINE HCL IN NACL 20-0.86 MG/200ML-% IV SOLN
INTRAVENOUS | Status: AC
  Filled 2022-10-26: qty 200

## 2022-10-26 MED ORDER — ONDANSETRON HCL 4 MG/2ML IJ SOLN
INTRAMUSCULAR | Status: AC
  Filled 2022-10-26: qty 2

## 2022-10-26 MED ORDER — FUROSEMIDE 10 MG/ML IJ SOLN
60.0000 mg | Freq: Once | INTRAMUSCULAR | Status: AC
  Administered 2022-10-26: 60 mg via INTRAVENOUS

## 2022-10-26 MED ORDER — RENA-VITE PO TABS
1.0000 | ORAL_TABLET | Freq: Every day | ORAL | Status: DC
  Administered 2022-10-27 – 2022-11-01 (×6): 1
  Filled 2022-10-26 (×7): qty 1

## 2022-10-26 MED ORDER — LORAZEPAM 2 MG/ML IJ SOLN: 1.0000 mg | Freq: Once | INTRAMUSCULAR | Status: AC

## 2022-10-26 MED ORDER — HYDRALAZINE HCL 20 MG/ML IJ SOLN
INTRAMUSCULAR | Status: AC
  Filled 2022-10-26: qty 1

## 2022-10-26 MED ORDER — NICARDIPINE HCL IN NACL 20-0.86 MG/200ML-% IV SOLN
3.0000 mg/h | INTRAVENOUS | Status: DC
  Administered 2022-10-26: 5 mg/h via INTRAVENOUS

## 2022-10-26 MED ORDER — FUROSEMIDE 10 MG/ML IJ SOLN
80.0000 mg | Freq: Once | INTRAMUSCULAR | Status: AC
  Administered 2022-10-26: 80 mg via INTRAVENOUS
  Filled 2022-10-26: qty 8

## 2022-10-26 MED ORDER — MORPHINE SULFATE (PF) 2 MG/ML IV SOLN
2.0000 mg | Freq: Once | INTRAVENOUS | Status: AC
  Administered 2022-10-26: 2 mg via INTRAVENOUS

## 2022-10-26 NOTE — Progress Notes (Signed)
ANTICOAGULATION CONSULT NOTE - Initial Consult  Pharmacy Consult for heparin  Indication: atrial fibrillation  Allergies  Allergen Reactions   Dulaglutide Nausea Only, Other (See Comments) and Cough    TRULICITY made the patient sick to her stomach (only) several days after using it   Oxycodone Itching and Other (See Comments)    Pt still takes this med even thought it causes itching   Trelegy Ellipta [Fluticasone-Umeclidin-Vilant] Nausea And Vomiting, Other (See Comments) and Cough    Made the patient sick to her stomach (only) several days after using it     Patient Measurements: Height: 5\' 5"  (165.1 cm) Weight: 108.7 kg (239 lb 10.2 oz) IBW/kg (Calculated) : 57 HEPARIN DW (KG): 81.2   Vital Signs: Temp: 98.6 F (37 C) (06/24 0812) Temp Source: Oral (06/24 0812) Pulse Rate: 61 (06/24 0738)  Labs: Recent Labs    10/25/22 0730 10/25/22 0745 10/25/22 1016 10/25/22 1555 10/25/22 1601 10/26/22 0207 10/26/22 0213  HGB 8.4*   < >  --   --  9.2* 8.1* 8.8*  HCT 28.6*   < >  --   --  27.0* 27.3* 26.0*  PLT 478*  --   --   --   --  422*  --   APTT  --   --   --   --   --  29  --   CREATININE 5.06*  --   --  4.45*  --  2.73*  --   TROPONINIHS 33*  --  38*  --   --   --   --    < > = values in this interval not displayed.    Estimated Creatinine Clearance: 19.8 mL/min (A) (by C-G formula based on SCr of 2.73 mg/dL (H)).   Medical History: Past Medical History:  Diagnosis Date   Allergy    Anxiety    Arthritis    CAD (coronary artery disease)    Mild per remote cath in 2006, /    Nuclear, January, 2013, no significant abnormality,   Carotid artery disease (HCC)    Doppler, January, 2013, 0 39% RIC A., 40-59% LICA, stable   Cataract    CHF (congestive heart failure) (HCC)    Chronic kidney disease    Complication of anesthesia    wakes up "mean"   COPD 02/16/2007   Intolerant of Spiriva, tudorza Intolerant of Trelegy    COPD (chronic obstructive pulmonary  disease) (HCC)    Coronary atherosclerosis 11/24/2008   Catheterization 2006, nonobstructive coronary disease   //   nuclear 2013, low risk     Diabetes mellitus, type 2 (HCC)    Diverticula of colon    DIVERTICULOSIS OF COLON 03/23/2007   Qualifier: Diagnosis of  By: Craige Cotta MD, Vineet     Dizziness    occasional   Dizzy spells 05/04/2011   DM (diabetes mellitus) (HCC) 03/23/2007   Qualifier: Diagnosis of  By: Craige Cotta MD, Vineet     Dyspnea    On exertion   Ejection fraction    Essential hypertension 02/16/2007   Qualifier: Diagnosis of  By: Earlene Plater CMA, Tammy     G E R D 03/23/2007   Qualifier: Diagnosis of  By: Craige Cotta MD, Vineet     Gall bladder disease 04/28/2016   Gastritis and gastroduodenitis    GERD (gastroesophageal reflux disease)    Goiter    hx of   Headache(784.0)    migraine   Hyperlipidemia    Hypertension  Hypothyroidism    HYPOTHYROIDISM, POSTSURGICAL 06/04/2008   Qualifier: Diagnosis of  By: Everardo All MD, Sean A    Left ventricular hypertrophy    Leg edema    occasional swelling   Leg swelling 12/09/2018   Myofascial pain syndrome, cervical 01/06/2018   Obesity    OBESITY 11/24/2008   Qualifier: Diagnosis of  By: Vikki Ports     OBSTRUCTIVE SLEEP APNEA 02/16/2007   Auto CPAP 09/03/13 to 10/02/13 >> used on 30 of 30 nights with average 6 hrs and 3 min.  Average AHI is 3.1 with median CPAP 5 cm H2O and 95 th percentile CPAP 6 cm H2O.     Occipital neuralgia of right side 01/06/2018   OSA (obstructive sleep apnea)    cpap setting of 12   Pain in joint of right hip 01/21/2018   Palpitations 01/21/2011   48 hour Holter, 2015, scattered PACs and PVCs.    Paroxysmal atrial fibrillation (HCC)    Pneumonia    PONV (postoperative nausea and vomiting)    severe after every surgery   Pulmonary hypertension, secondary    RHINITIS 07/21/2010        RUQ abdominal pain    S/P left TKA 07/27/2011   Sinus node dysfunction (HCC) 11/08/2015   Sleep apnea    Spinal  stenosis of cervical region 11/07/2014   Urinary frequency 12/09/2018   UTI (urinary tract infection) 08/15/2019   per pt report   VENTRICULAR HYPERTROPHY, LEFT 11/24/2008   Qualifier: Diagnosis of  By: Vikki Ports      Medications:  Medications Prior to Admission  Medication Sig Dispense Refill Last Dose   acetaminophen (TYLENOL) 500 MG tablet Take 1,000-1,500 mg by mouth every 8 (eight) hours as needed for mild pain, moderate pain or headache.      albuterol (PROVENTIL) (2.5 MG/3ML) 0.083% nebulizer solution Take 3 mLs (2.5 mg total) by nebulization every 2 (two) hours as needed for wheezing or shortness of breath. 75 mL 5    albuterol (VENTOLIN HFA) 108 (90 Base) MCG/ACT inhaler Inhale 2 puffs into the lungs every 6 (six) hours as needed for wheezing or shortness of breath. 3 each 3    Alcohol Swabs (B-D SINGLE USE SWABS REGULAR) PADS       allopurinol (ZYLOPRIM) 100 MG tablet Take 100 mg by mouth 2 (two) times daily.      amiodarone (PACERONE) 200 MG tablet Take 1 tablet (200 mg total) by mouth daily. 90 tablet 3    amLODipine (NORVASC) 5 MG tablet Take 2 tablets (10 mg total) by mouth daily.      ascorbic acid (VITAMIN C) 500 MG tablet Take 500 mg by mouth daily with lunch.      atorvastatin (LIPITOR) 40 MG tablet Take 1 tablet (40 mg total) by mouth daily. (Patient taking differently: Take 40 mg by mouth at bedtime.) 90 tablet 3    Blood Glucose Calibration (TRUE METRIX LEVEL 1) Low SOLN       Budeson-Glycopyrrol-Formoterol (BREZTRI AEROSPHERE) 160-9-4.8 MCG/ACT AERO Inhale 2 puffs into the lungs in the morning and at bedtime. 3 each 10    Carboxymethylcellulose Sodium (ARTIFICIAL TEARS OP) Place 1 drop into both eyes daily as needed (dry eyes).      ELIQUIS 2.5 MG TABS tablet TAKE 1 TABLET TWICE DAILY 180 tablet 3    ferrous sulfate 325 (65 FE) MG tablet Take 1 tablet (325 mg total) by mouth daily with breakfast. 90 tablet 3    fluticasone (FLONASE)  50 MCG/ACT nasal spray USE 2  SPRAYS NASALLY DAILY (Patient taking differently: Place 1 spray into both nostrils daily as needed for allergies. USE 2 SPRAYS NASALLY DAILY) 48 g 1    gabapentin (NEURONTIN) 800 MG tablet Take 800 mg by mouth 3 (three) times daily.      guaiFENesin (ROBITUSSIN) 100 MG/5ML liquid Take 5 mLs by mouth every 4 (four) hours as needed for cough or to loosen phlegm.      HYDROcodone-acetaminophen (NORCO) 10-325 MG tablet Take 1 tablet by mouth every 6 (six) hours as needed. 10 tablet 0    HYDROXYZINE HCL PO Take 25 mg by mouth at bedtime as needed (insomnia).      insulin aspart (NOVOLOG) 100 UNIT/ML FlexPen Inject 0-20 Units into the skin See admin instructions. Inject 0-20 units into the skin three times a day before meals "as needed," per SLIDING SCALE      insulin glargine (LANTUS) 100 UNIT/ML injection Inject 0.55 mLs (55 Units total) into the skin 2 (two) times daily. (Patient taking differently: Inject 25-50 Units into the skin See admin instructions. Inject 25 units subcutaneously daily in the morning. Inject 50 units subcutaneously daily at bedtime.  Patient report 90 units in the morning 25 units at bedtime, was adamant regarding dosage.) 10 mL 11    levothyroxine (SYNTHROID) 200 MCG tablet Take 200 mcg by mouth daily before breakfast.      loratadine (CLARITIN) 10 MG tablet Take 1 tablet (10 mg total) by mouth daily.      losartan (COZAAR) 25 MG tablet Take 25 mg by mouth daily.      melatonin 5 MG TABS Take 1 tablet (5 mg total) by mouth at bedtime as needed.  0    nitroGLYCERIN (NITROSTAT) 0.4 MG SL tablet Place 1 tablet (0.4 mg total) under the tongue every 5 (five) minutes as needed for chest pain. 25 tablet 4    polyethylene glycol (MIRALAX / GLYCOLAX) 17 g packet Take 17 g by mouth daily as needed for mild constipation or moderate constipation.  0    PRESCRIPTION MEDICATION CPAP- At bedtime and as needed during any time of rest      sennosides-docusate sodium (SENOKOT-S) 8.6-50 MG tablet  Take 1 tablet by mouth daily.      torsemide (DEMADEX) 20 MG tablet Take 2 tablets (40 mg total) by mouth daily.      TRUE METRIX BLOOD GLUCOSE TEST test strip       TRUEplus Lancets 28G MISC       vitamin B-12 (CYANOCOBALAMIN) 1000 MCG tablet Take 1,000 mcg by mouth daily with lunch.      Vitamin D3 (VITAMIN D) 25 MCG tablet Take 1,000 Units by mouth daily with lunch.      Scheduled:   Chlorhexidine Gluconate Cloth  6 each Topical Daily   insulin aspart  0-15 Units Subcutaneous Q4H   mouth rinse  15 mL Mouth Rinse 4 times per day   sodium chloride flush  3 mL Intravenous Q12H    Assessment: 82 yo female with AKI on CRRT. She is on apixaban PTA  (last dose 6/22 in the evening) for afib and plans are to start heparin in case of procedures -hg= 8.8, plt= 422  Goal of Therapy:  Heparin level= 0.3-0.7 aPTT 66-102 Monitor platelets by anticoagulation protocol: Yes   Plan:  -No heparin bolus due to recent apixaban -Start heparin at 1100 units/hr -heparin level and aPTT in 8 hrs  Harland German, PharmD Clinical  Pharmacist **Pharmacist phone directory can now be found on [email or web address] ([email or web address]).  Listed under [place].

## 2022-10-26 NOTE — Inpatient Diabetes Management (Signed)
Inpatient Diabetes Program Recommendations  AACE/ADA: New Consensus Statement on Inpatient Glycemic Control (2015)  Target Ranges:  Prepandial:   less than 140 mg/dL      Peak postprandial:   less than 180 mg/dL (1-2 hours)      Critically ill patients:  140 - 180 mg/dL   Lab Results  Component Value Date   GLUCAP 122 (H) 10/26/2022   HGBA1C 6.8 (H) 09/04/2022    Review of Glycemic Control  Latest Reference Range & Units 10/26/22 03:14 10/26/22 03:39 10/26/22 06:24 10/26/22 07:02 10/26/22 08:09  Glucose-Capillary 70 - 99 mg/dL 61 (L) 88 58 (L) 829 (H) 122 (H)  (L): Data is abnormally low (H): Data is abnormally high Diabetes history: Type 2 DM Outpatient Diabetes medications: Novolog 0-20 units TID, Lantus 25 units QA/50 units QP Current orders for Inpatient glycemic control: Novolog 0-15 units Q4H D10% @ 40 ml/hr  Inpatient Diabetes Program Recommendations:    Noted hypoglycemia. Consider decreasing correction to Novolog 0-6 units Q4H.   Thanks, Lujean Rave, MSN, RNC-OB Diabetes Coordinator [phone] (8a-5p)

## 2022-10-26 NOTE — Progress Notes (Signed)
Hypoglycemic Event  CBG: 61  Treatment: D50 25 mL (12.5 gm)  Symptoms: None  Follow-up CBG: Time:0339 CBG Result:88  Possible Reasons for Event: Inadequate meal intake  Comments/MD notified: will notify MD    Lyndal Pulley

## 2022-10-26 NOTE — Progress Notes (Addendum)
eLink Physician-Brief Progress Note Patient Name: Kayla [name] DOB: 05-25-1940 MRN: [id number]   Date of Service  10/26/2022  HPI/Events of Note  Notified of hypotension with MAPs going down to 50.  Nicardipine gtt has been off for a few hours. HR also in the 50s.    Dopamine restarted with BP 139/33, MAP 65, HR 57, RR 17, O2 sats 97% on BIPAP.   eICU Interventions  Continue dopamine.  Continue BIPAP.  Keep NPO.      Intervention Category Intermediate Interventions: Hypotension - evaluation and management  Larinda Buttery 10/26/2022, 10:22 PM  11:33 PM Notified of uneven pupils 2mm and 4mm but has become equal and 4mm now and are reactive.   Plan> Continue to monitor for now.

## 2022-10-26 NOTE — Progress Notes (Signed)
NAME:  Kayla [name], MRN:  [id number], DOB:  [birth year], LOS: 1 ADMISSION DATE:  10/25/2022, CONSULTATION DATE: 10/25/2022 REFERRING MD: Dr. Katrinka Blazing, CHIEF COMPLAINT: Altered mental status and shortness of breath  History of Present Illness:  82 year old female with hypertension, hyperlipidemia, CKD stage IV coronary artery disease, paroxysmal A-fib on Eliquis was brought into the emergency department accompanied by her husband because of respiratory distress and altered mental status.  As per patient's husband she is having increasing shortness of breath for last few days but this morning it got worse, so he called EMS, upon arrival patient was found to be in respiratory distress, initially BVM ventilation was performed, she was noted to be bradycardic with heart rate in 30s, started on transcutaneous pacing, initially was placed on nonrebreather facemask.  In ED patient was switched from facemask to BiPAP, she was noted to be severely bradycardic with heart rate in 30s with stable systolic blood pressure around 140.  She was confused, minimally responsive, ABG was done which showed pH 7.1 with high pCO2 and low bicarbonate.  Also noted to have AKI with baseline serum creatinine around 2.5 and admission creatinine of 5.4 with serum potassium of 6.6.  She was given calcium gluconate, sodium bicarbonate and Lasix.  PCCM was consulted for help evaluation medical management  Pertinent  Medical History   Past Medical History:  Diagnosis Date   Allergy    Anxiety    Arthritis    CAD (coronary artery disease)    Mild per remote cath in 2006, /    Nuclear, January, 2013, no significant abnormality,   Carotid artery disease (HCC)    Doppler, January, 2013, 0 39% RIC A., 40-59% LICA, stable   Cataract    CHF (congestive heart failure) (HCC)    Chronic kidney disease    Complication of anesthesia    wakes up "mean"   COPD 02/16/2007   Intolerant of Spiriva, tudorza Intolerant of Trelegy    COPD  (chronic obstructive pulmonary disease) (HCC)    Coronary atherosclerosis 11/24/2008   Catheterization 2006, nonobstructive coronary disease   //   nuclear 2013, low risk     Diabetes mellitus, type 2 (HCC)    Diverticula of colon    DIVERTICULOSIS OF COLON 03/23/2007   Qualifier: Diagnosis of  By: Craige Cotta MD, Vineet     Dizziness    occasional   Dizzy spells 05/04/2011   DM (diabetes mellitus) (HCC) 03/23/2007   Qualifier: Diagnosis of  By: Craige Cotta MD, Vineet     Dyspnea    On exertion   Ejection fraction    Essential hypertension 02/16/2007   Qualifier: Diagnosis of  By: Earlene Plater CMA, Tammy     G E R D 03/23/2007   Qualifier: Diagnosis of  By: Craige Cotta MD, Vineet     Gall bladder disease 04/28/2016   Gastritis and gastroduodenitis    GERD (gastroesophageal reflux disease)    Goiter    hx of   Headache(784.0)    migraine   Hyperlipidemia    Hypertension    Hypothyroidism    HYPOTHYROIDISM, POSTSURGICAL 06/04/2008   Qualifier: Diagnosis of  By: Everardo All MD, Sean A    Left ventricular hypertrophy    Leg edema    occasional swelling   Leg swelling 12/09/2018   Myofascial pain syndrome, cervical 01/06/2018   Obesity    OBESITY 11/24/2008   Qualifier: Diagnosis of  By: Vikki Ports     OBSTRUCTIVE SLEEP APNEA 02/16/2007   Auto CPAP  09/03/13 to 10/02/13 >> used on 30 of 30 nights with average 6 hrs and 3 min.  Average AHI is 3.1 with median CPAP 5 cm H2O and 95 th percentile CPAP 6 cm H2O.     Occipital neuralgia of right side 01/06/2018   OSA (obstructive sleep apnea)    cpap setting of 12   Pain in joint of right hip 01/21/2018   Palpitations 01/21/2011   48 hour Holter, 2015, scattered PACs and PVCs.    Paroxysmal atrial fibrillation (HCC)    Pneumonia    PONV (postoperative nausea and vomiting)    severe after every surgery   Pulmonary hypertension, secondary    RHINITIS 07/21/2010        RUQ abdominal pain    S/P left TKA 07/27/2011   Sinus node dysfunction (HCC) 11/08/2015    Sleep apnea    Spinal stenosis of cervical region 11/07/2014   Urinary frequency 12/09/2018   UTI (urinary tract infection) 08/15/2019   per pt report   VENTRICULAR HYPERTROPHY, LEFT 11/24/2008   Qualifier: Diagnosis of  By: Vikki Ports       Significant Hospital Events: Including procedures, antibiotic start and stop dates in addition to other pertinent events     Interim History / Subjective:  Patient was transitioned off of BiPAP, her mental status has improved She remained afebrile Complaining of left-sided chest wall pain, continues to have cough but unable to bring up phlegm  Objective   Blood pressure (!) 109/36, pulse 61, temperature 98.6 F (37 C), temperature source Oral, resp. rate 18, height 5\' 5"  (1.651 m), weight 108.7 kg, SpO2 97 %. CVP:  [8 mmHg-16 mmHg] 10 mmHg  Vent Mode: BIPAP FiO2 (%):  [40 %-50 %] 40 % Set Rate:  [22 bmp] 22 bmp PEEP:  [5 cmH20] 5 cmH20   Intake/Output Summary (Last 24 hours) at 10/26/2022 0820 Last data filed at 10/26/2022 0800 Gross per 24 hour  Intake 1195 ml  Output 4486.1 ml  Net -3291.1 ml   Filed Weights   10/25/22 0800 10/25/22 1103 10/26/22 0600  Weight: 104.3 kg 110.2 kg 108.7 kg    Examination: Physical exam: General: Acute on chronically ill-appearing female, lying on the bed HEENT: [place]/AT, eyes anicteric.  Dry mucus membranes Neuro: Alert, awake following commands Chest: Tachypneic, reduced air entry at the bases bilaterally, no wheezes or rhonchi Heart: Bradycardic with heart rate in 50s, no murmurs or gallops Abdomen: Soft, nontender, nondistended, bowel sounds present Skin: No rash Extremities: 3+ pitting edema  Labs and images were reviewed  Resolved Hospital Problem list     Assessment & Plan:  Acute on chronic hypoxic/hypercapnic respiratory failure Mixed metabolic/respiratory acidosis Acute on chronic HFpEF AKI on CKD stage IV Hyperkalemia/hyponatremia, improved Acute metabolic/uremic  encephalopathy, improving Sepsis due to community-acquired pneumonia Bradycardia/junctional rhythm, history of sick sinus syndrome Paroxysmal A-fib Anemia of chronic disease  BiPAP was transition off, currently on 4 L nasal cannula oxygen She uses 3 to 4 L of oxygen at home Hypercapnia has cleared Trend ABGs Mental status has improved Serum bicarbonate trended up to 22, acidosis has resolved Continue CRRT per discussion with nephrology Serum sodium and serum potassium has corrected Avoid sedation Procalcitonin slightly elevated to 0.16 MRSA screen is negative, will stop vancomycin Continue Zosyn for now Follow-up cultures Decreased to mean to 2.5 Appreciate cardiology consult Patient remained in sinus rhythm Patient is on Eliquis at home for stroke prophylaxis, currently on hold, she may need heparin infusion She had pacemaker  which was removed last month due to infected pacemaker leads She may need pacemaker replacement Closely monitor electrolytes Monitor intake and output Avoid nephrotoxic agents Monitor H&H  Best Practice (right click and "Reselect all SmartList Selections" daily)   Diet/type: NPO bedside speech and swallow evaluation DVT prophylaxis: SCD GI prophylaxis: N/A Lines: Dialysis Catheter and Arterial Line Foley:  Yes, and it is still needed Code Status:  DNR Last date of multidisciplinary goals of care discussion [6/23: Goals of care discussions were carried with patient's husband, he decided to continue full scope of care while keeping her DNR/DNI]  Labs   CBC: Recent Labs  Lab 10/25/22 0730 10/25/22 0745 10/25/22 1601 10/26/22 0207 10/26/22 0213  WBC 18.3*  --   --  11.6*  --   NEUTROABS 15.2*  --   --   --   --   HGB 8.4* 9.2* 9.2* 8.1* 8.8*  HCT 28.6* 27.0* 27.0* 27.3* 26.0*  MCV 97.3  --   --  94.1  --   PLT 478*  --   --  422*  --     Basic Metabolic Panel: Recent Labs  Lab 10/25/22 0729 10/25/22 0730 10/25/22 0745 10/25/22 1555  10/25/22 1601 10/26/22 0207 10/26/22 0213  NA 133*  133* 134* 133* 134* 134* 135 137  K 6.5*  6.6* 6.6* 6.6* 6.6* 6.5* 4.5 4.8  CL 105 99  --  99  --  103  --   CO2  --  19*  --  21*  --  22  --   GLUCOSE 198* 207*  --  164*  --  73  --   BUN >130* 120*  --  106*  --  63*  --   CREATININE 5.40* 5.06*  --  4.45*  --  2.73*  --   CALCIUM  --  8.9  --  8.5*  --  7.6*  --   MG  --   --   --   --   --  2.1  --   PHOS  --   --   --  5.8*  --  3.9  --    GFR: Estimated Creatinine Clearance: 19.8 mL/min (A) (by C-G formula based on SCr of 2.73 mg/dL (H)). Recent Labs  Lab 10/25/22 0730 10/25/22 0748 10/25/22 1016 10/26/22 0207  PROCALCITON  --   --  0.16  --   WBC 18.3*  --   --  11.6*  LATICACIDVEN  --  0.6  --   --     Liver Function Tests: Recent Labs  Lab 10/25/22 1555 10/26/22 0207  AST  --  14*  ALT  --  18  ALKPHOS  --  49  BILITOT  --  0.5  PROT  --  6.0*  ALBUMIN 2.5* 2.2*   No results for input(s): "LIPASE", "AMYLASE" in the last 168 hours. No results for input(s): "AMMONIA" in the last 168 hours.  ABG    Component Value Date/Time   PHART 7.308 (L) 10/26/2022 0213   PCO2ART 54.7 (H) 10/26/2022 0213   PO2ART 92 10/26/2022 0213   HCO3 27.5 10/26/2022 0213   TCO2 29 10/26/2022 0213   ACIDBASEDEF 5.0 (H) 10/25/2022 1601   O2SAT 96 10/26/2022 0213     Coagulation Profile: No results for input(s): "INR", "PROTIME" in the last 168 hours.  Cardiac Enzymes: No results for input(s): "CKTOTAL", "CKMB", "CKMBINDEX", "TROPONINI" in the last 168 hours.  HbA1C: Hgb A1c MFr Bld  Date/Time Value  Ref Range Status  09/04/2022 01:29 AM 6.8 (H) 4.8 - 5.6 % Final    Comment:    (NOTE) Pre diabetes:          5.7%-6.4%  Diabetes:              >6.4%  Glycemic control for   <7.0% adults with diabetes   05/30/2021 10:23 AM 8.4 (H) 4.8 - 5.6 % Final    Comment:    (NOTE) Pre diabetes:          5.7%-6.4%  Diabetes:              >6.4%  Glycemic control for    <7.0% adults with diabetes     CBG: Recent Labs  Lab 10/26/22 0209 10/26/22 0314 10/26/22 0339 10/26/22 0624 10/26/22 0702  GLUCAP 73 61* 88 58* 108*    Critical care time:     The patient is critically ill due to acute renal failure/acute hypoxic/hypercapnic respiratory failure, Sepsis due to pneumonia.  Critical care was necessary to treat or prevent imminent or life-threatening deterioration.  Critical care was time spent personally by me on the following activities: development of treatment plan with patient and/or surrogate as well as nursing, discussions with consultants, evaluation of patient's response to treatment, examination of patient, obtaining history from patient or surrogate, ordering and performing treatments and interventions, ordering and review of laboratory studies, ordering and review of radiographic studies, pulse oximetry, re-evaluation of patient's condition and participation in multidisciplinary rounds.   During this encounter critical care time was devoted to patient care services described in this note for 39 minutes.     Cheri Fowler, MD [facility] See Amion for pager If no response to pager, please call [phone] until 7pm After 7pm, Please call E-link [phone]

## 2022-10-26 NOTE — Progress Notes (Signed)
Hypoglycemic Event  CBG: 46  Treatment: D50 50 mL (25 gm)  Symptoms: None  Follow-up CBG: Time:0007 CBG Result:100  Possible Reasons for Event: Inadequate meal intake  Comments/MD notified:    [employer]

## 2022-10-26 NOTE — Progress Notes (Signed)
DAILY PROGRESS NOTE   Patient Name: Kayla [name] Date of Encounter: 10/26/2022 Cardiologist: Donato Schultz, MD  Chief Complaint   Awake, feels short of breath  Patient Profile   Kayla [name] is a 82 y.o. female with a hx of obesity, type 2 diabetes, hypertension, hyperlipidemia, CAD, congestive heart failure, paroxysmal atrial fibrillation (on Eliquis), sick sinus syndrome status post pacemaker, CKD, anemia, COPD, GERD, hypothyroidism, chronic venous insufficiency, status post left first ray amputation (09/04/2022) with active osteomyelitis who is being seen 10/25/2022 for the evaluation of bradycardia at the request of Dr. Rodena Medin.   Subjective   Much more alert today - sitting up, oriented x 3 - on CVVHD. Recorded over 3L net negative. Started on low dose dopamine per nephrology for renal perfusion and bradycardia. HR in the 50-60's. BP in the low 100's-120's. Potassium better today at 4.8.  Dopamine weaned to 2.5 mcg/kg/min.  Objective   Vitals:   10/26/22 0645 10/26/22 0700 10/26/22 0738 10/26/22 0812  BP:      Pulse: 64 (!) 59 61   Resp: 17 (!) 22 18   Temp:    98.6 F (37 C)  TempSrc:    Oral  SpO2: 99% 94% 97%   Weight:      Height:        Intake/Output Summary (Last 24 hours) at 10/26/2022 5784 Last data filed at 10/26/2022 0800 Gross per 24 hour  Intake 1095 ml  Output 4456.1 ml  Net -3361.1 ml   Filed Weights   10/25/22 0800 10/25/22 1103 10/26/22 0600  Weight: 104.3 kg 110.2 kg 108.7 kg    Physical Exam   General appearance: alert, no distress, morbidly obese, and pale Neck: JVD - 5 cm above sternal notch, no carotid bruit, and thyroid not enlarged, symmetric, no tenderness/mass/nodules Lungs: diminished breath sounds bibasilar and bilaterally Heart: regular rate and rhythm Abdomen: soft, non-tender; bowel sounds normal; no masses,  no organomegaly Extremities: edema 2+ bilateral LE Pulses: 2+ and symmetric Skin: Skin color, texture, turgor  normal. No rashes or lesions Neurologic: Alert and oriented X 3, normal strength and tone. Normal symmetric reflexes. Normal coordination and gait Psych: Pleasant  Inpatient Medications    Scheduled Meds:  Chlorhexidine Gluconate Cloth  6 each Topical Daily   insulin aspart  0-15 Units Subcutaneous Q4H   mouth rinse  15 mL Mouth Rinse 4 times per day   sodium chloride flush  3 mL Intravenous Q12H    Continuous Infusions:  sodium chloride Stopped (10/26/22 0421)   dextrose 40 mL/hr at 10/26/22 0800   DOPamine 5 mcg/kg/min (10/26/22 0800)   heparin 10,000 units/ 20 mL infusion syringe 500 Units/hr (10/26/22 0812)   piperacillin-tazobactam Stopped (10/26/22 0601)   prismasol BGK 2/2.5 dialysis solution 1,500 mL/hr at 10/26/22 0743   prismasol BGK 2/2.5 replacement solution 400 mL/hr at 10/26/22 0154   prismasol BGK 2/2.5 replacement solution 400 mL/hr at 10/26/22 0156    PRN Meds: sodium chloride, acetaminophen **OR** acetaminophen, albuterol, heparin, mouth rinse   Labs   Results for orders placed or performed during the hospital encounter of 10/25/22 (from the past 48 hour(s))  I-stat chem 8, ED (not at [facility], [facility] or [facility])     Status: Abnormal   Collection Time: 10/25/22  7:29 AM  Result Value Ref Range   Sodium 133 (L) 135 - 145 mmol/L   Potassium 6.5 (HH) 3.5 - 5.1 mmol/L   Chloride 105 98 - 111 mmol/L   BUN >130 (  H) 8 - 23 mg/dL   Creatinine, Ser 7.42 (H) 0.44 - 1.00 mg/dL   Glucose, Bld 595 (H) 70 - 99 mg/dL    Comment: Glucose reference range applies only to samples taken after fasting for at least 8 hours.   Calcium, Ion 1.16 1.15 - 1.40 mmol/L   TCO2 21 (L) 22 - 32 mmol/L   Hemoglobin 9.9 (L) 12.0 - 15.0 g/dL   HCT 63.8 (L) 75.6 - 43.3 %   Comment NOTIFIED PHYSICIAN   I-Stat venous blood gas, ([facility] ED, [facility], [facility])     Status: Abnormal   Collection Time: 10/25/22  7:29 AM  Result Value Ref Range   pH, Ven 7.240 (L) 7.25 - 7.43   pCO2, Ven 51.2 44 - 60 mmHg   pO2, Ven  46 (H) 32 - 45 mmHg   Bicarbonate 22.0 20.0 - 28.0 mmol/L   TCO2 23 22 - 32 mmol/L   O2 Saturation 73 %   Acid-base deficit 5.0 (H) 0.0 - 2.0 mmol/L   Sodium 133 (L) 135 - 145 mmol/L   Potassium 6.6 (HH) 3.5 - 5.1 mmol/L   Calcium, Ion 1.13 (L) 1.15 - 1.40 mmol/L   HCT 27.0 (L) 36.0 - 46.0 %   Hemoglobin 9.2 (L) 12.0 - 15.0 g/dL   Sample type VENOUS    Comment NOTIFIED PHYSICIAN   CBC with Differential     Status: Abnormal   Collection Time: 10/25/22  7:30 AM  Result Value Ref Range   WBC 18.3 (H) 4.0 - 10.5 K/uL   RBC 2.94 (L) 3.87 - 5.11 MIL/uL   Hemoglobin 8.4 (L) 12.0 - 15.0 g/dL   HCT 29.5 (L) 18.8 - 41.6 %   MCV 97.3 80.0 - 100.0 fL   MCH 28.6 26.0 - 34.0 pg   MCHC 29.4 (L) 30.0 - 36.0 g/dL   RDW 60.6 (H) 30.1 - 60.1 %   Platelets 478 (H) 150 - 400 K/uL   nRBC 0.2 0.0 - 0.2 %   Neutrophils Relative % 83 %   Neutro Abs 15.2 (H) 1.7 - 7.7 K/uL   Lymphocytes Relative 8 %   Lymphs Abs 1.4 0.7 - 4.0 K/uL   Monocytes Relative 7 %   Monocytes Absolute 1.3 (H) 0.1 - 1.0 K/uL   Eosinophils Relative 1 %   Eosinophils Absolute 0.1 0.0 - 0.5 K/uL   Basophils Relative 0 %   Basophils Absolute 0.0 0.0 - 0.1 K/uL   Immature Granulocytes 1 %   Abs Immature Granulocytes 0.18 (H) 0.00 - 0.07 K/uL    Comment: Performed at [facility], [address], [place], [place] [address]  Basic metabolic panel     Status: Abnormal   Collection Time: 10/25/22  7:30 AM  Result Value Ref Range   Sodium 134 (L) 135 - 145 mmol/L   Potassium 6.6 (HH) 3.5 - 5.1 mmol/L    Comment: CRITICAL RESULT CALLED TO, READ BACK BY AND VERIFIED WITH K RAND RN AT [phone] BY D LONG   Chloride 99 98 - 111 mmol/L   CO2 19 (L) 22 - 32 mmol/L   Glucose, Bld 207 (H) 70 - 99 mg/dL    Comment: Glucose reference range applies only to samples taken after fasting for at least 8 hours.   BUN 120 (H) 8 - 23 mg/dL   Creatinine, Ser 2.70 (H) 0.44 - 1.00 mg/dL   Calcium 8.9 8.9 - 62.3 mg/dL   GFR, Estimated 8 (L)  >60  mL/min    Comment: (NOTE) Calculated using the CKD-EPI Creatinine Equation (2021)    Anion gap 16 (H) 5 - 15    Comment: Performed at [facility], [address], [place], [place] [address]  Troponin I (High Sensitivity)     Status: Abnormal   Collection Time: 10/25/22  7:30 AM  Result Value Ref Range   Troponin I (High Sensitivity) 33 (H) <18 ng/L    Comment: (NOTE) Elevated high sensitivity troponin I (hsTnI) values and significant  changes across serial measurements may suggest ACS but many other  chronic and acute conditions are known to elevate hsTnI results.  Refer to the "Links" section for chest pain algorithms and additional  guidance. Performed at [facility], [address] [place], [place] [address]   Brain natriuretic peptide     Status: Abnormal   Collection Time: 10/25/22  7:31 AM  Result Value Ref Range   B Natriuretic Peptide 819.8 (H) 0.0 - 100.0 pg/mL    Comment: Performed at [facility], [address], [place], [place] [address]  I-Stat arterial blood gas, ED     Status: Abnormal   Collection Time: 10/25/22  7:45 AM  Result Value Ref Range   pH, Arterial 7.165 (LL) 7.35 - 7.45   pCO2 arterial 63.8 (H) 32 - 48 mmHg   pO2, Arterial 371 (H) 83 - 108 mmHg   Bicarbonate 23.4 20.0 - 28.0 mmol/L   TCO2 25 22 - 32 mmol/L   O2 Saturation 100 %   Acid-base deficit 6.0 (H) 0.0 - 2.0 mmol/L   Sodium 133 (L) 135 - 145 mmol/L   Potassium 6.6 (HH) 3.5 - 5.1 mmol/L   Calcium, Ion 1.22 1.15 - 1.40 mmol/L   HCT 27.0 (L) 36.0 - 46.0 %   Hemoglobin 9.2 (L) 12.0 - 15.0 g/dL   Patient temperature 21.3 C    Sample type ARTERIAL    Comment NOTIFIED PHYSICIAN   Lactic acid, plasma     Status: None   Collection Time: 10/25/22  7:48 AM  Result Value Ref Range   Lactic Acid, Venous 0.6 0.5 - 1.9 mmol/L    Comment: Performed at [facility], [address], [place], [place] [address]  Culture, blood (routine x 2)     Status: None (Preliminary  result)   Collection Time: 10/25/22  8:24 AM   Specimen: BLOOD LEFT ARM  Result Value Ref Range   Specimen Description BLOOD LEFT ARM    Special Requests      BOTTLES DRAWN AEROBIC AND ANAEROBIC Blood Culture adequate volume   Culture      NO GROWTH < 24 HOURS Performed at [facility], [address] [place], [place] [address]    Report Status PENDING   Culture, blood (routine x 2)     Status: None (Preliminary result)   Collection Time: 10/25/22  8:26 AM   Specimen: BLOOD  Result Value Ref Range   Specimen Description BLOOD SITE NOT SPECIFIED    Special Requests      BOTTLES DRAWN AEROBIC AND ANAEROBIC Blood Culture results may not be optimal due to an inadequate volume of blood received in culture bottles   Culture      NO GROWTH < 24 HOURS Performed at [facility], [address], [place], [place] [address]    Report Status PENDING   CBG monitoring, ED     Status: Abnormal   Collection  Time: 10/25/22  9:04 AM  Result Value Ref Range   Glucose-Capillary 202 (H) 70 - 99 mg/dL    Comment: Glucose reference range applies only to samples taken after fasting for at least 8 hours.  Troponin I (High Sensitivity)     Status: Abnormal   Collection Time: 10/25/22 10:16 AM  Result Value Ref Range   Troponin I (High Sensitivity) 38 (H) <18 ng/L    Comment: (NOTE) Elevated high sensitivity troponin I (hsTnI) values and significant  changes across serial measurements may suggest ACS but many other  chronic and acute conditions are known to elevate hsTnI results.  Refer to the "Links" section for chest pain algorithms and additional  guidance. Performed at [facility], [address] [place], [place] [address]   Procalcitonin     Status: None   Collection Time: 10/25/22 10:16 AM  Result Value Ref Range   Procalcitonin 0.16 ng/mL    Comment:        Interpretation: PCT (Procalcitonin) <= 0.5 ng/mL: Systemic infection (sepsis) is not likely. Local bacterial  infection is possible. (NOTE)       Sepsis PCT Algorithm           Lower Respiratory Tract                                      Infection PCT Algorithm    ----------------------------     ----------------------------         PCT < 0.25 ng/mL                PCT < 0.10 ng/mL          Strongly encourage             Strongly discourage   discontinuation of antibiotics    initiation of antibiotics    ----------------------------     -----------------------------       PCT 0.25 - 0.50 ng/mL            PCT 0.10 - 0.25 ng/mL               OR       >80% decrease in PCT            Discourage initiation of                                            antibiotics      Encourage discontinuation           of antibiotics    ----------------------------     -----------------------------         PCT >= 0.50 ng/mL              PCT 0.26 - 0.50 ng/mL               AND        <80% decrease in PCT             Encourage initiation of                                             antibiotics       Encourage continuation  of antibiotics    ----------------------------     -----------------------------        PCT >= 0.50 ng/mL                  PCT > 0.50 ng/mL               AND         increase in PCT                  Strongly encourage                                      initiation of antibiotics    Strongly encourage escalation           of antibiotics                                     -----------------------------                                           PCT <= 0.25 ng/mL                                                 OR                                        > 80% decrease in PCT                                      Discontinue / Do not initiate                                             antibiotics  Performed at [facility], [address], [place], [place] [address]   MRSA Next Gen by PCR, Nasal     Status: None   Collection Time: 10/25/22 11:46 AM   Specimen: Nasal Mucosa;  Nasal Swab  Result Value Ref Range   MRSA by PCR Next Gen NOT DETECTED NOT DETECTED    Comment: (NOTE) The GeneXpert MRSA Assay (FDA approved for NASAL specimens only), is one component of a comprehensive MRSA colonization surveillance program. It is not intended to diagnose MRSA infection nor to guide or monitor treatment for MRSA infections. Test performance is not FDA approved in patients less than 74 years old. Performed at [facility], [address], [place], [place] [address]   Glucose, capillary     Status: Abnormal   Collection Time: 10/25/22 12:12 PM  Result Value Ref Range   Glucose-Capillary 159 (H) 70 - 99 mg/dL    Comment: Glucose reference range applies only to samples taken after fasting for at least 8 hours.  Renal function panel (daily at 1600)     Status: Abnormal  Collection Time: 10/25/22  3:55 PM  Result Value Ref Range   Sodium 134 (L) 135 - 145 mmol/L   Potassium 6.6 (HH) 3.5 - 5.1 mmol/L    Comment: CRITICAL RESULT CALLED TO, READ BACK BY AND VERIFIED WITH Morrison Old RN AT [phone] BY D LONG   Chloride 99 98 - 111 mmol/L   CO2 21 (L) 22 - 32 mmol/L   Glucose, Bld 164 (H) 70 - 99 mg/dL    Comment: Glucose reference range applies only to samples taken after fasting for at least 8 hours.   BUN 106 (H) 8 - 23 mg/dL   Creatinine, Ser 0.45 (H) 0.44 - 1.00 mg/dL   Calcium 8.5 (L) 8.9 - 10.3 mg/dL   Phosphorus 5.8 (H) 2.5 - 4.6 mg/dL   Albumin 2.5 (L) 3.5 - 5.0 g/dL   GFR, Estimated 9 (L) >60 mL/min    Comment: (NOTE) Calculated using the CKD-EPI Creatinine Equation (2021)    Anion gap 14 5 - 15    Comment: Performed at [facility], [address], [place], [place] [address]  Glucose, capillary     Status: Abnormal   Collection Time: 10/25/22  3:59 PM  Result Value Ref Range   Glucose-Capillary 144 (H) 70 - 99 mg/dL    Comment: Glucose reference range applies only to samples taken after fasting for at least 8 hours.  I-STAT 7, (LYTES, BLD  GAS, ICA, H+H)     Status: Abnormal   Collection Time: 10/25/22  4:01 PM  Result Value Ref Range   pH, Arterial 7.239 (L) 7.35 - 7.45   pCO2 arterial 52.8 (H) 32 - 48 mmHg   pO2, Arterial 80 (L) 83 - 108 mmHg   Bicarbonate 23.0 20.0 - 28.0 mmol/L   TCO2 25 22 - 32 mmol/L   O2 Saturation 94 %   Acid-base deficit 5.0 (H) 0.0 - 2.0 mmol/L   Sodium 134 (L) 135 - 145 mmol/L   Potassium 6.5 (HH) 3.5 - 5.1 mmol/L   Calcium, Ion 1.20 1.15 - 1.40 mmol/L   HCT 27.0 (L) 36.0 - 46.0 %   Hemoglobin 9.2 (L) 12.0 - 15.0 g/dL   Patient temperature 19.1 F    Sample type ARTERIAL    Comment NOTIFIED PHYSICIAN   Glucose, capillary     Status: None   Collection Time: 10/25/22  7:47 PM  Result Value Ref Range   Glucose-Capillary 84 70 - 99 mg/dL    Comment: Glucose reference range applies only to samples taken after fasting for at least 8 hours.  Glucose, capillary     Status: Abnormal   Collection Time: 10/25/22 11:36 PM  Result Value Ref Range   Glucose-Capillary 46 (L) 70 - 99 mg/dL    Comment: Glucose reference range applies only to samples taken after fasting for at least 8 hours.  Glucose, capillary     Status: Abnormal   Collection Time: 10/26/22 12:07 AM  Result Value Ref Range   Glucose-Capillary 100 (H) 70 - 99 mg/dL    Comment: Glucose reference range applies only to samples taken after fasting for at least 8 hours.  Comprehensive metabolic panel     Status: Abnormal   Collection Time: 10/26/22  2:07 AM  Result Value Ref Range   Sodium 135 135 - 145 mmol/L   Potassium 4.5 3.5 - 5.1 mmol/L   Chloride 103 98 - 111 mmol/L   CO2 22 22 - 32 mmol/L   Glucose, Bld 73  70 - 99 mg/dL    Comment: Glucose reference range applies only to samples taken after fasting for at least 8 hours.   BUN 63 (H) 8 - 23 mg/dL   Creatinine, Ser 1.76 (H) 0.44 - 1.00 mg/dL   Calcium 7.6 (L) 8.9 - 10.3 mg/dL   Total Protein 6.0 (L) 6.5 - 8.1 g/dL   Albumin 2.2 (L) 3.5 - 5.0 g/dL   AST 14 (L) 15 - 41 U/L   ALT  18 0 - 44 U/L   Alkaline Phosphatase 49 38 - 126 U/L   Total Bilirubin 0.5 0.3 - 1.2 mg/dL   GFR, Estimated 17 (L) >60 mL/min    Comment: (NOTE) Calculated using the CKD-EPI Creatinine Equation (2021)    Anion gap 10 5 - 15    Comment: Performed at [facility], [address], [place], [place] [address]  CBC     Status: Abnormal   Collection Time: 10/26/22  2:07 AM  Result Value Ref Range   WBC 11.6 (H) 4.0 - 10.5 K/uL   RBC 2.90 (L) 3.87 - 5.11 MIL/uL   Hemoglobin 8.1 (L) 12.0 - 15.0 g/dL   HCT 71.0 (L) 62.6 - 94.8 %   MCV 94.1 80.0 - 100.0 fL   MCH 27.9 26.0 - 34.0 pg   MCHC 29.7 (L) 30.0 - 36.0 g/dL   RDW 54.6 (H) 27.0 - 35.0 %   Platelets 422 (H) 150 - 400 K/uL   nRBC 0.2 0.0 - 0.2 %    Comment: Performed at [facility], [address] [place], [place] [address]  Magnesium     Status: None   Collection Time: 10/26/22  2:07 AM  Result Value Ref Range   Magnesium 2.1 1.7 - 2.4 mg/dL    Comment: Performed at [facility], [address] [place], [place] [address]  APTT     Status: None   Collection Time: 10/26/22  2:07 AM  Result Value Ref Range   aPTT 29 24 - 36 seconds    Comment: Performed at [facility], [address] [place], [place] [address]  Phosphorus     Status: None   Collection Time: 10/26/22  2:07 AM  Result Value Ref Range   Phosphorus 3.9 2.5 - 4.6 mg/dL    Comment: Performed at [facility], [address] [place], [place] [address]  Glucose, capillary     Status: None   Collection Time: 10/26/22  2:09 AM  Result Value Ref Range   Glucose-Capillary 73 70 - 99 mg/dL    Comment: Glucose reference range applies only to samples taken after fasting for at least 8 hours.  I-STAT 7, (LYTES, BLD GAS, ICA, H+H)     Status: Abnormal   Collection Time: 10/26/22  2:13 AM  Result Value Ref Range   pH, Arterial 7.308 (L) 7.35 - 7.45   pCO2 arterial 54.7 (H) 32 - 48 mmHg   pO2, Arterial 92 83 - 108 mmHg   Bicarbonate 27.5 20.0 -  28.0 mmol/L   TCO2 29 22 - 32 mmol/L   O2 Saturation 96 %   Acid-Base Excess 1.0 0.0 - 2.0 mmol/L   Sodium 137 135 - 145 mmol/L   Potassium 4.8 3.5 - 5.1 mmol/L   Calcium, Ion 1.18 1.15 - 1.40 mmol/L   HCT 26.0 (L) 36.0 - 46.0 %   Hemoglobin 8.8 (L) 12.0 - 15.0 g/dL   Patient temperature 10.1 C  Sample type ARTERIAL   Glucose, capillary     Status: Abnormal   Collection Time: 10/26/22  3:14 AM  Result Value Ref Range   Glucose-Capillary 61 (L) 70 - 99 mg/dL    Comment: Glucose reference range applies only to samples taken after fasting for at least 8 hours.  Glucose, capillary     Status: None   Collection Time: 10/26/22  3:39 AM  Result Value Ref Range   Glucose-Capillary 88 70 - 99 mg/dL    Comment: Glucose reference range applies only to samples taken after fasting for at least 8 hours.  Glucose, capillary     Status: Abnormal   Collection Time: 10/26/22  6:24 AM  Result Value Ref Range   Glucose-Capillary 58 (L) 70 - 99 mg/dL    Comment: Glucose reference range applies only to samples taken after fasting for at least 8 hours.  Glucose, capillary     Status: Abnormal   Collection Time: 10/26/22  7:02 AM  Result Value Ref Range   Glucose-Capillary 108 (H) 70 - 99 mg/dL    Comment: Glucose reference range applies only to samples taken after fasting for at least 8 hours.  Glucose, capillary     Status: Abnormal   Collection Time: 10/26/22  8:09 AM  Result Value Ref Range   Glucose-Capillary 122 (H) 70 - 99 mg/dL    Comment: Glucose reference range applies only to samples taken after fasting for at least 8 hours.    ECG   N/A  Telemetry   Sinus bradycardia with some NSVT overnight- Personally Reviewed  Radiology    ECHOCARDIOGRAM LIMITED  Result Date: 10/25/2022    ECHOCARDIOGRAM LIMITED REPORT   Patient Name:   Kayla [name] Date of Exam: 10/25/2022 Medical Rec #:  [id number]        Height:       65.0 in Accession #:    [id number]       Weight:       230.0 lb Date  of Birth:  [birth year]       BSA:          2.099 m Patient Age:    81 years         BP:           88/55 mmHg Patient Gender: F                HR:           38 bpm. Exam Location:  Inpatient Procedure: Limited Echo Indications:    R06.9 DOE  History:        Patient has prior history of Echocardiogram examinations, most                 recent 09/07/2022. COPD, Arrythmias:Atrial Fibrillation; Risk                 Factors:Hypertension, Diabetes, Dyslipidemia and Sleep Apnea.                 Infected Pacemaker lead removed 09/08/22 without re-implant.  Sonographer:    Irving Burton Senior RDCS Referring Phys: [id number] Perlie Gold  Sonographer Comments: Limited for LV function, scanned upright on bipap for dyspnea IMPRESSIONS  1. Left ventricular ejection fraction, by estimation, is 70 to 75%. Left ventricular ejection fraction by PLAX is 66 %. The left ventricle has hyperdynamic function. The left ventricle has no regional wall motion abnormalities. There is mild concentric left ventricular hypertrophy.  2. There is mild calcification  of the aortic valve. There is moderate thickening of the aortic valve. FINDINGS  Left Ventricle: Left ventricular ejection fraction, by estimation, is 70 to 75%. Left ventricular ejection fraction by PLAX is 66 %. The left ventricle has hyperdynamic function. The left ventricle has no regional wall motion abnormalities. The left ventricular internal cavity size was normal in size. There is mild concentric left ventricular hypertrophy. Pericardium: There is no evidence of pericardial effusion. Mitral Valve: Mild to moderate mitral annular calcification. Aortic Valve: There is mild calcification of the aortic valve. There is moderate thickening of the aortic valve. LEFT VENTRICLE PLAX 2D LV EF:         Left ventricular ejection fraction by PLAX is 66 %. LVIDd:         4.90 cm LVIDs:         3.10 cm LV PW:         1.20 cm LV IVS:        1.20 cm  Rachelle Hora Croitoru MD Electronically signed by Thurmon Fair  MD Signature Date/Time: 10/25/2022/12:43:51 PM    Final    DG Chest Portable 1 View  Result Date: 10/25/2022 CLINICAL DATA:  Respiratory distress. EXAM: PORTABLE CHEST 1 VIEW COMPARISON:  09/11/2022 FINDINGS: Rotated film. The cardio pericardial silhouette is enlarged. Bilateral diffuse, basilar predominant airspace disease is stable to mildly progressive in the interval. Left lung base is not been included on the study. Bilateral pleural effusions suspected. Bones are diffusely demineralized. Telemetry leads overlie the chest. IMPRESSION: 1. Rotated film with bilateral diffuse, basilar predominant airspace disease, stable to mildly progressive in the interval. Imaging features likely reflect edema although diffuse infection could have this appearance. 2. Suspect bilateral pleural effusions. 3. Left lung base not included on the study. Electronically Signed   By: Kennith Center M.D.   On: 10/25/2022 07:48    Cardiac Studies   N/A  Assessment   Principal Problem:   Acute respiratory failure with hypoxia and hypercapnia (HCC) Active Problems:   AKI (acute kidney injury) (HCC)   Junctional bradycardia   Hyperkalemia   Plan   HR improved on low dose dopamine - weaning per PCCM. Doubt she will need temporary pacing - suspect the marked volume overload, acidemia and electrolyte abnormalities were worsening conduction. Continue to monitor. Would continue IV Heparin while on CVVHD.  CRITICAL CARE TIME: I have spent a total of 35 minutes with patient reviewing hospital notes, telemetry, EKGs, labs and examining the patient as well as establishing an assessment and plan that was discussed with the patient.  > 50% of time was spent in direct patient care. The patient is critically ill with multi-organ system failure and requires high complexity decision making for assessment and support, frequent evaluation and titration of therapies, application of advanced monitoring technologies and extensive  interpretation of multiple databases.   Length of Stay:  LOS: 1 day   Chrystie Nose, MD, [facility], FACP  [place]  [facility]  Medical Director of the Advanced Lipid Disorders &  Cardiovascular Risk Reduction Clinic Diplomate of the American Board of Clinical Lipidology Attending Cardiologist  Direct Dial: [phone]  Fax: [phone]  Website:  [email or web address] Nicely Kaysen Deal 10/26/2022, 9:06 AM

## 2022-10-26 NOTE — Progress Notes (Signed)
[facility] Progress Note    Assessment/ Plan:   Acute kidney injury on CKD stage IV likely due to CHF exacerbation/fluid overload/bradycardia leading to reduced renal perfusion.  She has hyperkalemia, anasarca and did not respond with IV diuretics.  Also features of uremic encephalopathy including tremor, AMS. -temp HD catheter placed on 6/23, started CRRT 6/23. Would continue with CRRT today, all 2k bags. UF goal net neg 100cc/hr   Hyperkalemia: Received medical treatment in the ER.  On CRRT. On all 2k bags. K WNL this AM   Bradycardia: Seen by cardiologist.  On dopamine gtt, HR better now.    Acute respiratory failure/respiratory distress with hypoxia: UF'ing as tolerated with CRRT   Sepsis due to possible pneumonia: On antibiotics per primary team.  Hyponatremia, mild: likely secondary to AKI and hypervolemia. Improved with CRRT, WNL now  Anemia: transfuse for Hgb <7. Will check Fe panel  Discussed with primary service and ICU staff.  Subjective:   Patient seen and examined on CRRT. Tolerating CRRT. On dopamine. She is wondering why she is here. Seems to be more awake this AM.   Objective:   BP (!) 109/36   Pulse 61   Temp 98.1 F (36.7 C) (Axillary)   Resp 18   Ht 5\' 5"  (1.651 m)   Wt 108.7 kg   SpO2 97%   BMI 39.88 kg/m   Intake/Output Summary (Last 24 hours) at 10/26/2022 0756 Last data filed at 10/26/2022 0700 Gross per 24 hour  Intake 1145.88 ml  Output 4261.2 ml  Net -3115.32 ml   Weight change:   Physical Exam: Gen: ill appearing, sitting up in bed CVS: RRR Resp: decreased breath sounds bibasilar Abd: soft, nt/nd Ext: 1+ pitting edema b/l Les Neuro: AAOx3, awake, alert, +asterixis Dialysis access: RIJ temp line c/d/i  Imaging: ECHOCARDIOGRAM LIMITED  Result Date: 10/25/2022    ECHOCARDIOGRAM LIMITED REPORT   Patient Name:   Kayla [name] Date of Exam: 10/25/2022 Medical Rec #:  [id number]        Height:       65.0 in Accession #:     [id number]       Weight:       230.0 lb Date of Birth:  07-27-1940       BSA:          2.099 m Patient Age:    81 years         BP:           88/55 mmHg Patient Gender: F                HR:           38 bpm. Exam Location:  Inpatient Procedure: Limited Echo Indications:    R06.9 DOE  History:        Patient has prior history of Echocardiogram examinations, most                 recent 09/07/2022. COPD, Arrythmias:Atrial Fibrillation; Risk                 Factors:Hypertension, Diabetes, Dyslipidemia and Sleep Apnea.                 Infected Pacemaker lead removed 09/08/22 without re-implant.  Sonographer:    Irving Burton Senior RDCS Referring Phys: [id number] Perlie Gold  Sonographer Comments: Limited for LV function, scanned upright on bipap for dyspnea IMPRESSIONS  1. Left ventricular ejection fraction, by estimation, is 70 to 75%. Left  ventricular ejection fraction by PLAX is 66 %. The left ventricle has hyperdynamic function. The left ventricle has no regional wall motion abnormalities. There is mild concentric left ventricular hypertrophy.  2. There is mild calcification of the aortic valve. There is moderate thickening of the aortic valve. FINDINGS  Left Ventricle: Left ventricular ejection fraction, by estimation, is 70 to 75%. Left ventricular ejection fraction by PLAX is 66 %. The left ventricle has hyperdynamic function. The left ventricle has no regional wall motion abnormalities. The left ventricular internal cavity size was normal in size. There is mild concentric left ventricular hypertrophy. Pericardium: There is no evidence of pericardial effusion. Mitral Valve: Mild to moderate mitral annular calcification. Aortic Valve: There is mild calcification of the aortic valve. There is moderate thickening of the aortic valve. LEFT VENTRICLE PLAX 2D LV EF:         Left ventricular ejection fraction by PLAX is 66 %. LVIDd:         4.90 cm LVIDs:         3.10 cm LV PW:         1.20 cm LV IVS:        1.20 cm  Rachelle Hora Croitoru  MD Electronically signed by Thurmon Fair MD Signature Date/Time: 10/25/2022/12:43:51 PM    Final    DG Chest Portable 1 View  Result Date: 10/25/2022 CLINICAL DATA:  Respiratory distress. EXAM: PORTABLE CHEST 1 VIEW COMPARISON:  09/11/2022 FINDINGS: Rotated film. The cardio pericardial silhouette is enlarged. Bilateral diffuse, basilar predominant airspace disease is stable to mildly progressive in the interval. Left lung base is not been included on the study. Bilateral pleural effusions suspected. Bones are diffusely demineralized. Telemetry leads overlie the chest. IMPRESSION: 1. Rotated film with bilateral diffuse, basilar predominant airspace disease, stable to mildly progressive in the interval. Imaging features likely reflect edema although diffuse infection could have this appearance. 2. Suspect bilateral pleural effusions. 3. Left lung base not included on the study. Electronically Signed   By: Kennith Center M.D.   On: 10/25/2022 07:48    Labs: BMET Recent Labs  Lab 10/25/22 0729 10/25/22 0730 10/25/22 0745 10/25/22 1555 10/25/22 1601 10/26/22 0207 10/26/22 0213  NA 133*  133* 134* 133* 134* 134* 135 137  K 6.5*  6.6* 6.6* 6.6* 6.6* 6.5* 4.5 4.8  CL 105 99  --  99  --  103  --   CO2  --  19*  --  21*  --  22  --   GLUCOSE 198* 207*  --  164*  --  73  --   BUN >130* 120*  --  106*  --  63*  --   CREATININE 5.40* 5.06*  --  4.45*  --  2.73*  --   CALCIUM  --  8.9  --  8.5*  --  7.6*  --   PHOS  --   --   --  5.8*  --  3.9  --    CBC Recent Labs  Lab 10/25/22 0730 10/25/22 0745 10/25/22 1601 10/26/22 0207 10/26/22 0213  WBC 18.3*  --   --  11.6*  --   NEUTROABS 15.2*  --   --   --   --   HGB 8.4* 9.2* 9.2* 8.1* 8.8*  HCT 28.6* 27.0* 27.0* 27.3* 26.0*  MCV 97.3  --   --  94.1  --   PLT 478*  --   --  422*  --     Medications:  Chlorhexidine Gluconate Cloth  6 each Topical Daily   insulin aspart  0-15 Units Subcutaneous Q4H   mouth rinse  15 mL Mouth Rinse 4  times per day   sodium chloride flush  3 mL Intravenous Q12H      Anthony Sar, MD [facility] 10/26/2022, 7:56 AM

## 2022-10-26 NOTE — TOC Initial Note (Signed)
Transition of Care [facility]) - Initial/Assessment Note    Patient Details  Name: Kayla [name] MRN: [id number] Date of Birth: 11/14/1940  [facility] CM/SW Contact:    Elliot Cousin, RN Phone Number: [phone] 10/26/2022, 5:12 PM  Clinical Narrative:   TOC CM spoke to pt and husband at bedside. States she was recently at [facility] for rehab. States she was not satisfied with their facility. Husband states pt is active with [facility]. She has needed DME. She has oxygen at home. Pt is requesting a wheelchair, husband is not for a wheelchair at this time. He wants her to stay mobile. She uses Rollator in the house. Will continue to follow for dc needs.                 Expected Discharge Plan: Home w Home Health Services Barriers to Discharge: Continued Medical Work up   Patient Goals and CMS Choice Patient states their goals for this hospitalization and ongoing recovery are:: wants to get true rehab CMS Medicare.gov Compare Post Acute Care list provided to:: Patient Choice offered to / list presented to : Patient      Expected Discharge Plan and Services   Discharge Planning Services: CM Consult Post Acute Care Choice: Home Health Living arrangements for the past 2 months: Single Family Home                                      Prior Living Arrangements/Services Living arrangements for the past 2 months: Single Family Home Lives with:: Spouse Patient language and need for interpreter reviewed:: Yes Do you feel safe going back to the place where you live?: Yes      Need for Family Participation in Patient Care: Yes (Comment) Care giver support system in place?: Yes (comment) Current home services: DME (rolling walker, rollator, CPAP, oxygen) Criminal Activity/Legal Involvement Pertinent to Current Situation/Hospitalization: No - Comment as needed  Activities of Daily Living      Permission Sought/Granted Permission sought to share  information with : Case Manager, Family Supports, PCP Permission granted to share information with : Yes, Verbal Permission Granted  Share Information with NAME: Matalyn Nawaz  Permission granted to share info w AGENCY: Home Health, DME  Permission granted to share info w Relationship: husband  Permission granted to share info w Contact Information: [phone]  Emotional Assessment Appearance:: Appears stated age Attitude/Demeanor/Rapport: Engaged Affect (typically observed): Accepting Orientation: : Oriented to Self, Oriented to Place, Oriented to  Time, Oriented to Situation   Psych Involvement: No (comment)  Admission diagnosis:  Hyperkalemia [E87.5] Bradycardia [R00.1] Symptomatic bradycardia [R00.1] AKI (acute kidney injury) (HCC) [N17.9] Acute respiratory failure with hypoxia and hypercapnia (HCC) [J96.01, J96.02] Patient Active Problem List   Diagnosis Date Noted   Acute respiratory failure with hypoxia and hypercapnia (HCC) 10/25/2022   Junctional bradycardia 10/25/2022   Hyperkalemia 10/25/2022   Bradycardia 10/01/2022   MRSA bacteremia 10/01/2022   AKI (acute kidney injury) (HCC) 10/01/2022   Complication of central venous catheter 10/01/2022   Osteomyelitis (HCC) 10/01/2022   Closed nondisplaced fracture of proximal phalanx of right great toe 09/06/2022   Pyogenic inflammation of bone (HCC) 09/05/2022   Acute hematogenous osteomyelitis of left foot (HCC) 09/04/2022   Diabetic foot infection (HCC) 09/03/2022   Chronic respiratory failure with hypoxia (HCC) 02/20/2022   Pleuritic pain 02/20/2022   Toe osteomyelitis, left (  HCC) 01/20/2022   Carotid bruit 07/07/2021   Septic prepatellar bursitis of right knee 05/30/2021   Cellulitis of right lower extremity 03/19/2021   Chronic kidney disease, stage 3b (HCC) 03/19/2021   Polymyalgia rheumatica (HCC) 04/15/2020   Temporal arteritis (HCC) 04/15/2020   Pacemaker - MDT 01/24/2020   Orthostatic lightheadedness 01/24/2020    Chronic osteomyelitis involving left ankle and foot (HCC)    Paroxysmal atrial fibrillation (HCC) 10/25/2019   Secondary hypercoagulable state (HCC) 10/25/2019   Numbness and tingling of right leg 10/19/2019   Lumbar radiculopathy 10/19/2019   Chronic diastolic heart failure (HCC) 08/13/2019   Chest pain 08/13/2019   Leukocytosis 08/13/2019   Leg swelling 12/09/2018   Urinary frequency 12/09/2018   Pain in joint of right hip 01/21/2018   Myofascial pain syndrome, cervical 01/06/2018   Occipital neuralgia of right side 01/06/2018   RUQ abdominal pain    Gastritis and gastroduodenitis    Bile duct abnormality    Cholecystitis 04/30/2016   Gall bladder disease 04/28/2016   Sinus node dysfunction (HCC) 11/08/2015   Ejection fraction    Spinal stenosis of cervical region 11/07/2014   Preoperative clearance 10/09/2014   Carotid artery disease (HCC)    S/P left TKA 07/27/2011   Dizzy spells 05/04/2011   Palpitations 01/21/2011   RHINITIS 07/21/2010   Hyperlipidemia associated with type 2 diabetes mellitus (HCC) 11/24/2008   OBESITY 11/24/2008   Coronary atherosclerosis 11/24/2008   VENTRICULAR HYPERTROPHY, LEFT 11/24/2008   HYPOTHYROIDISM, POSTSURGICAL 06/04/2008   Headache 06/04/2008   Insulin dependent type 2 diabetes mellitus (HCC) 03/23/2007   Esophageal reflux 03/23/2007   DIVERTICULOSIS OF COLON 03/23/2007   GENERALIZED OSTEOARTHROSIS UNSPECIFIED SITE 03/23/2007   OSA (obstructive sleep apnea) 02/16/2007   Hypertension associated with diabetes (HCC) 02/16/2007   COPD (chronic obstructive pulmonary disease) (HCC) 02/16/2007   PCP:  Ailene Ravel, MD Pharmacy:   [facility] [phone] [place], [place] - [address] AT [facility] [address] [place] [address] Phone: [phone] Fax: [phone]     Social Determinants of Health (SDOH) Social History: SDOH Screenings   Food Insecurity: No Food Insecurity (10/01/2022)  Housing: Patient  Declined (10/01/2022)  Transportation Needs: No Transportation Needs (10/01/2022)  Utilities: Not At Risk (10/01/2022)  Alcohol Screen: Low Risk  (01/31/2021)  Depression (PHQ2-9): Low Risk  (10/15/2022)  Financial Resource Strain: Low Risk  (01/31/2021)  Physical Activity: Insufficiently Active (09/30/2021)  Social Connections: Socially Integrated (09/30/2021)  Stress: No Stress Concern Present (09/30/2021)  Tobacco Use: Medium Risk (10/25/2022)   SDOH Interventions:     Readmission Risk Interventions    10/01/2022    3:52 PM 01/22/2022    2:43 PM 07/23/2021   12:10 PM  Readmission Risk Prevention Plan  Transportation Screening Complete Complete Complete  PCP or Specialist Appt within 3-5 Days Complete Complete   HRI or Home Care Consult  Complete   Social Work Consult for Recovery Care Planning/Counseling Complete Complete   Palliative Care Screening Not Applicable Not Applicable   Medication Review Oceanographer) Referral to Pharmacy Complete Complete  HRI or Home Care Consult   Complete  SW Recovery Care/Counseling Consult   Complete  Palliative Care Screening   Not Applicable  Skilled Nursing Facility   Complete

## 2022-10-26 NOTE — Progress Notes (Signed)
ANTICOAGULATION CONSULT NOTE  Pharmacy Consult for heparin  Indication: atrial fibrillation  Allergies  Allergen Reactions   Dulaglutide Nausea Only, Other (See Comments) and Cough    TRULICITY made the patient sick to her stomach (only) several days after using it   Oxycodone Itching and Other (See Comments)    Pt still takes this med even thought it causes itching   Trelegy Ellipta [Fluticasone-Umeclidin-Vilant] Nausea And Vomiting, Other (See Comments) and Cough    Made the patient sick to her stomach (only) several days after using it     Patient Measurements: Height: 5\' 5"  (165.1 cm) Weight: 108.7 kg (239 lb 10.2 oz) IBW/kg (Calculated) : 57 HEPARIN DW (KG): 81.2   Vital Signs: Temp: 98.7 F (37.1 C) (06/24 1612) Temp Source: Oral (06/24 1612) BP: 155/38 (06/24 1814) Pulse Rate: 58 (06/24 1915)  Labs: Recent Labs    10/25/22 0730 10/25/22 0745 10/25/22 1016 10/25/22 1555 10/25/22 1601 10/26/22 0207 10/26/22 0213 10/26/22 1610 10/26/22 1722 10/26/22 1830  HGB 8.4*   < >  --   --    < > 8.1* 8.8*  --  8.8*  --   HCT 28.6*   < >  --   --    < > 27.3* 26.0*  --  26.0*  --   PLT 478*  --   --   --   --  422*  --   --   --   --   APTT  --   --   --   --   --  29  --   --   --  42*  HEPARINUNFRC  --   --   --   --   --   --   --   --   --  >1.10*  CREATININE 5.06*  --   --  4.45*  --  2.73*  --  2.34*  --   --   TROPONINIHS 33*  --  38*  --   --   --   --   --   --   --    < > = values in this interval not displayed.     Estimated Creatinine Clearance: 23.1 mL/min (A) (by C-G formula based on SCr of 2.34 mg/dL (H)).   Assessment: 82 yo female with AKI on CRRT. She is on apixaban PTA  (last dose 6/22 in the evening) for afib and plans are to start heparin in case of procedures.  Heparin level >1.1 (affected by apixaban), aPTT 42 sec (subtherapeutic) on infusion at 1100 units/hr. No issues with line or bleeding reported per RN.  Goal of Therapy:  Heparin  level 0.3-0.7 units/ml aPTT 66-102 Monitor platelets by anticoagulation protocol: Yes   Plan:  Increase heparin infusion to 1300 units/hr F/u aPTT in 8 hours  Christoper Fabian, PharmD, BCPS Please see amion for complete clinical pharmacist phone list 10/26/2022 7:58 PM

## 2022-10-26 NOTE — Progress Notes (Signed)
eLink Physician-Brief Progress Note Patient Name: Kayla [name] DOB: 1940-09-29 MRN: [id number]   Date of Service  10/26/2022  HPI/Events of Note  Notified of hypoglycemia, with glucose down to 46  eICU Interventions  Pt on CRRT. Placed order for D10W @40ml /hr.  Will continue to monitor serial glucose and adjust drip as warranted.         Jinnifer Montejano M DELA CRUZ 10/26/2022, 4:00 AM

## 2022-10-26 NOTE — Plan of Care (Signed)
Called by ICU staff. HD catheter issues, likely positional. Poor BFR, numerous alarms. Will go ahead and stop CRRT for now and give her a dose of lasix 80mg  IV x 1. Will reassess for further RRT needs tomorrow (if proceeding with RRT, then will likely need a new catheter).  Anthony Sar, MD [facility]

## 2022-10-26 NOTE — Progress Notes (Signed)
Initial Nutrition Assessment  DOCUMENTATION CODES:   Not applicable  INTERVENTION:   Add Renal MVI  If po intake inadequate, recommend liberalizing diet   NUTRITION DIAGNOSIS:   Increased nutrient needs related to acute illness, catabolic illness as evidenced by estimated needs.  GOAL:   Patient will meet greater than or equal to 90% of their needs  MONITOR:   PO intake, Labs, Weight trends, Skin, I & O's  REASON FOR ASSESSMENT:   Consult Assessment of nutrition requirement/status  ASSESSMENT:   82 yo female admitted with acute on chronic respiratory failure with mixed acidosis, acute on chronic HFpEF, AKI on CKD 4 requiring CRRT, sepsis. PMH includes, CAD, CHF, CKD, COPD, DM, GERD, OSA, multiple toe amputations  Pt alert on visit today, husband at bedside, Currently on D10 and dopamine infusions Pt remains on CRRT this AM, CRRT later discontinued due to multiple issues Initially on BiPap which has been transition to 4L [place] which is pt's baseline  NPO. Plan for diet advancement today per RN. Pt reports she is hungry, reports eating well with good appetite until "this" happened. Pt reports she eats 3 meals per day at home.   Pt denies any weight changes (up or down). However, pt with significant edema on exam. Pt reports UBW around 200 pounds, current wt 230 pounds. Current dry weight unknown at this time  UOP 895 mL in 24 hours with 3L fluid removal with CRRT, 255 thus far today  Pt reports she takes Vitamin D, B12 and C at home  Noted hypoglycemia overnight requiring D10 infusion  Labs: CBGs 58-122, Creatinine 2.73, BUN 63, potassium wdl, sodium wdl, phosphorus wdl, magnesium wdl Meds: ss novolog   NUTRITION - FOCUSED PHYSICAL EXAM: Very limited exam given presence of significant edema in all extremities, generalized as well. Goal would be to repeat once diuresed  Flowsheet Row Most Recent Value  Orbital Region No depletion  Upper Arm Region Unable to assess   Thoracic and Lumbar Region Unable to assess  Buccal Region No depletion  Temple Region Mild depletion  Clavicle Bone Region Mild depletion  Clavicle and Acromion Bone Region Unable to assess  Scapular Bone Region Unable to assess  Dorsal Hand Unable to assess  Patellar Region Unable to assess  Anterior Thigh Region Unable to assess  Posterior Calf Region Unable to assess  Edema (RD Assessment) Severe       Diet Order:   Diet Order             Diet heart healthy/carb modified Room service appropriate? Yes; Fluid consistency: Thin  Diet effective now                   EDUCATION NEEDS:   Education needs have been addressed  Skin:  Skin Assessment: Skin Integrity Issues: Skin Integrity Issues:: Other (Comment) Other: non-pressure related wounds to Left LE  Last BM:  PTA  Height:   Ht Readings from Last 1 Encounters:  10/25/22 5\' 5"  (1.651 m)    Weight:   Wt Readings from Last 1 Encounters:  10/26/22 108.7 kg    BMI:  Body mass index is 39.88 kg/m.  Estimated Nutritional Needs:   Kcal:  1700-1900 kcals  Protein:  85-100 g  Fluid:  1L plus UOP   Romelle Starcher MS, RDN, LDN, CNSC Registered Dietitian 3 Clinical Nutrition RD Pager and On-Call Pager Number Located in [place]

## 2022-10-26 NOTE — Progress Notes (Signed)
Patient CRRT was stopped as catheter got blocked and kept on having high pressures.  After speaking with nephrology we decided to keep CRRT off  5 PM: Patient blood pressure increased to 170s, she was given 10 of IV hydralazine without much help  510: Patient dropped her O2 sat down to 70s, she was put on BiPAP with improvement in O2 sat but she was tachypneic and restless  520: Blood pressure remaining 200s, she was given 10 mg of IV hydralazine again, ordered 0.2 mg clonidine patch, she was given 1 mg of IV Ativan without much help.  After 5 minutes 2 mg of IV morphine was given, her blood pressure remained elevated in 200s and she is complaining of shortness of breath She was given 80 mg of Lasix prior, 60 mg of IV Lasix was given again  Despite all measures patient blood pressure remained in 200s, she is agitated and restless on BiPAP.  Cardene infusion was started, currently at max 20 mg/h Now blood pressure started improving, slowly trended down to 170s, titrate Cardene infusion with SBP goal 160.  Patient's husband was updated at bedside.  Will continue full scope of care while keeping her DNR/DNI, currently on BiPAP  Critical care time spent 40 minutes     Cheri Fowler, MD [facility] See Amion for pager If no response to pager, please call [phone] until 7pm After 7pm, Please call E-link [phone]

## 2022-10-26 NOTE — Progress Notes (Addendum)
At approximately 1706 patient stated that she could not breathe. Pilar Jarvis and this RN to bedside. Patient O2 saturations 84%. O2 increased. Patient tachypneic with accessory muscle use. Crackles and wheezes present upon auscultation. Sudham Chand,MD notified. Patient placed on nonrebreather. No relief given by nonrebreather. BIPAP placed; Respiratory therapist to bedside SBP in 200s.  MD to bedside. New orders placed. RN continues to be at bedside at this time

## 2022-10-27 ENCOUNTER — Inpatient Hospital Stay (HOSPITAL_COMMUNITY): Payer: Medicare HMO

## 2022-10-27 DIAGNOSIS — N179 Acute kidney failure, unspecified: Secondary | ICD-10-CM | POA: Diagnosis not present

## 2022-10-27 DIAGNOSIS — E875 Hyperkalemia: Secondary | ICD-10-CM | POA: Diagnosis not present

## 2022-10-27 DIAGNOSIS — J9601 Acute respiratory failure with hypoxia: Secondary | ICD-10-CM | POA: Diagnosis not present

## 2022-10-27 DIAGNOSIS — R001 Bradycardia, unspecified: Secondary | ICD-10-CM | POA: Diagnosis not present

## 2022-10-27 LAB — GLUCOSE, CAPILLARY
Glucose-Capillary: 109 mg/dL — ABNORMAL HIGH (ref 70–99)
Glucose-Capillary: 116 mg/dL — ABNORMAL HIGH (ref 70–99)
Glucose-Capillary: 118 mg/dL — ABNORMAL HIGH (ref 70–99)
Glucose-Capillary: 138 mg/dL — ABNORMAL HIGH (ref 70–99)
Glucose-Capillary: 187 mg/dL — ABNORMAL HIGH (ref 70–99)
Glucose-Capillary: 68 mg/dL — ABNORMAL LOW (ref 70–99)
Glucose-Capillary: 73 mg/dL (ref 70–99)
Glucose-Capillary: 73 mg/dL (ref 70–99)
Glucose-Capillary: 85 mg/dL (ref 70–99)

## 2022-10-27 LAB — APTT
aPTT: 58 seconds — ABNORMAL HIGH (ref 24–36)
aPTT: 79 seconds — ABNORMAL HIGH (ref 24–36)

## 2022-10-27 LAB — RENAL FUNCTION PANEL
Albumin: 2 g/dL — ABNORMAL LOW (ref 3.5–5.0)
Albumin: 2.1 g/dL — ABNORMAL LOW (ref 3.5–5.0)
Anion gap: 10 (ref 5–15)
Anion gap: 10 (ref 5–15)
BUN: 55 mg/dL — ABNORMAL HIGH (ref 8–23)
BUN: 57 mg/dL — ABNORMAL HIGH (ref 8–23)
CO2: 25 mmol/L (ref 22–32)
CO2: 25 mmol/L (ref 22–32)
Calcium: 8 mg/dL — ABNORMAL LOW (ref 8.9–10.3)
Calcium: 7.9 mg/dL — ABNORMAL LOW (ref 8.9–10.3)
Chloride: 100 mmol/L (ref 98–111)
Chloride: 102 mmol/L (ref 98–111)
Creatinine, Ser: 2.7 mg/dL — ABNORMAL HIGH (ref 0.44–1.00)
Creatinine, Ser: 2.81 mg/dL — ABNORMAL HIGH (ref 0.44–1.00)
GFR, Estimated: 17 mL/min — ABNORMAL LOW (ref >60)
GFR, Estimated: 16 mL/min — ABNORMAL LOW (ref >60)
Glucose, Bld: 86 mg/dL (ref 70–99)
Glucose, Bld: 208 mg/dL — ABNORMAL HIGH (ref 70–99)
Phosphorus: 4.5 mg/dL (ref 2.5–4.6)
Phosphorus: 4.5 mg/dL (ref 2.5–4.6)
Potassium: 4.3 mmol/L (ref 3.5–5.1)
Potassium: 4.2 mmol/L (ref 3.5–5.1)
Sodium: 135 mmol/L (ref 135–145)
Sodium: 137 mmol/L (ref 135–145)

## 2022-10-27 LAB — FERRITIN: Ferritin: 78 ng/mL (ref 11–307)

## 2022-10-27 LAB — IRON AND TIBC
Iron: 29 ug/dL (ref 28–170)
Saturation Ratios: 13 % (ref 10.4–31.8)
TIBC: 224 ug/dL — ABNORMAL LOW (ref 250–450)
UIBC: 195 ug/dL

## 2022-10-27 LAB — CBC
HCT: 25.3 % — ABNORMAL LOW (ref 36.0–46.0)
Hemoglobin: 7.6 g/dL — ABNORMAL LOW (ref 12.0–15.0)
MCH: 28.1 pg (ref 26.0–34.0)
MCHC: 30 g/dL (ref 30.0–36.0)
MCV: 93.7 fL (ref 80.0–100.0)
Platelets: 391 10*3/uL (ref 150–400)
RBC: 2.7 MIL/uL — ABNORMAL LOW (ref 3.87–5.11)
RDW: 16.9 % — ABNORMAL HIGH (ref 11.5–15.5)
WBC: 13.6 10*3/uL — ABNORMAL HIGH (ref 4.0–10.5)
nRBC: 0 % (ref 0.0–0.2)

## 2022-10-27 LAB — MAGNESIUM: Magnesium: 2.1 mg/dL (ref 1.7–2.4)

## 2022-10-27 LAB — HEPARIN LEVEL (UNFRACTIONATED): Heparin Unfractionated: >1.1 IU/mL — ABNORMAL HIGH (ref 0.30–0.70)

## 2022-10-27 MED ORDER — FUROSEMIDE 10 MG/ML IJ SOLN
120.0000 mg | Freq: Two times a day (BID) | INTRAVENOUS | Status: DC
  Administered 2022-10-27 (×2): 120 mg via INTRAVENOUS
  Filled 2022-10-27: qty 10
  Filled 2022-10-27: qty 2
  Filled 2022-10-27 (×2): qty 12

## 2022-10-27 MED ORDER — DEXTROSE 50 % IV SOLN
12.5000 g | Freq: Once | INTRAVENOUS | Status: AC
  Administered 2022-10-27: 12.5 g via INTRAVENOUS
  Filled 2022-10-27: qty 50

## 2022-10-27 MED ORDER — BISACODYL 5 MG PO TBEC
10.0000 mg | DELAYED_RELEASE_TABLET | Freq: Every day | ORAL | Status: DC
  Administered 2022-10-29 – 2022-11-12 (×10): 10 mg via ORAL
  Filled 2022-10-27 (×15): qty 2

## 2022-10-27 MED ORDER — HYDRALAZINE HCL 20 MG/ML IJ SOLN: 10.0000 mg | INTRAMUSCULAR | Status: DC | PRN

## 2022-10-27 MED ORDER — DARBEPOETIN ALFA 60 MCG/0.3ML IJ SOSY
60.0000 ug | PREFILLED_SYRINGE | INTRAMUSCULAR | Status: DC
  Administered 2022-10-28: 60 ug via SUBCUTANEOUS
  Filled 2022-10-27: qty 0.3

## 2022-10-27 MED ORDER — INSULIN ASPART 100 UNIT/ML IJ SOLN
2.0000 [IU] | INTRAMUSCULAR | Status: DC
  Administered 2022-10-27: 4 [IU] via SUBCUTANEOUS
  Administered 2022-10-28: 2 [IU] via SUBCUTANEOUS
  Administered 2022-10-28 (×2): 4 [IU] via SUBCUTANEOUS
  Administered 2022-10-28: 6 [IU] via SUBCUTANEOUS
  Administered 2022-10-28 – 2022-10-29 (×2): 4 [IU] via SUBCUTANEOUS
  Administered 2022-10-29 (×2): 2 [IU] via SUBCUTANEOUS

## 2022-10-27 MED ORDER — GUAIFENESIN ER 600 MG PO TB12
600.0000 mg | ORAL_TABLET | Freq: Two times a day (BID) | ORAL | Status: DC
  Administered 2022-10-27 – 2022-11-14 (×34): 600 mg via ORAL
  Filled 2022-10-27 (×36): qty 1

## 2022-10-27 MED ORDER — PIPERACILLIN-TAZOBACTAM IN DEX 2-0.25 GM/50ML IV SOLN
2.2500 g | Freq: Four times a day (QID) | INTRAVENOUS | Status: AC
  Administered 2022-10-27 – 2022-10-29 (×10): 2.25 g via INTRAVENOUS
  Filled 2022-10-27 (×14): qty 50

## 2022-10-27 MED ORDER — BISACODYL 10 MG RE SUPP
10.0000 mg | Freq: Every day | RECTAL | Status: DC
  Filled 2022-10-27 (×4): qty 1

## 2022-10-27 NOTE — Progress Notes (Signed)
NAME:  Kayla [name], MRN:  [id number], DOB:  July 19, 1940, LOS: 2 ADMISSION DATE:  10/25/2022, CONSULTATION DATE: 10/25/2022 REFERRING MD: Dr. Katrinka Blazing, CHIEF COMPLAINT: Altered mental status and shortness of breath  History of Present Illness:  82 year old female with hypertension, hyperlipidemia, CKD stage IV coronary artery disease, paroxysmal A-fib on Eliquis was brought into the emergency department accompanied by her husband because of respiratory distress and altered mental status.  As per patient's husband she is having increasing shortness of breath for last few days but this morning it got worse, so he called EMS, upon arrival patient was found to be in respiratory distress, initially BVM ventilation was performed, she was noted to be bradycardic with heart rate in 30s, started on transcutaneous pacing, initially was placed on nonrebreather facemask.  In ED patient was switched from facemask to BiPAP, she was noted to be severely bradycardic with heart rate in 30s with stable systolic blood pressure around 140.  She was confused, minimally responsive, ABG was done which showed pH 7.1 with high pCO2 and low bicarbonate.  Also noted to have AKI with baseline serum creatinine around 2.5 and admission creatinine of 5.4 with serum potassium of 6.6.  She was given calcium gluconate, sodium bicarbonate and Lasix.  PCCM was consulted for help evaluation medical management  Pertinent  Medical History   Past Medical History:  Diagnosis Date   Allergy    Anxiety    Arthritis    CAD (coronary artery disease)    Mild per remote cath in 2006, /    Nuclear, January, 2013, no significant abnormality,   Carotid artery disease (HCC)    Doppler, January, 2013, 0 39% RIC A., 40-59% LICA, stable   Cataract    CHF (congestive heart failure) (HCC)    Chronic kidney disease    Complication of anesthesia    wakes up "mean"   COPD 02/16/2007   Intolerant of Spiriva, tudorza Intolerant of Trelegy    COPD  (chronic obstructive pulmonary disease) (HCC)    Coronary atherosclerosis 11/24/2008   Catheterization 2006, nonobstructive coronary disease   //   nuclear 2013, low risk     Diabetes mellitus, type 2 (HCC)    Diverticula of colon    DIVERTICULOSIS OF COLON 03/23/2007   Qualifier: Diagnosis of  By: Craige Cotta MD, Vineet     Dizziness    occasional   Dizzy spells 05/04/2011   DM (diabetes mellitus) (HCC) 03/23/2007   Qualifier: Diagnosis of  By: Craige Cotta MD, Vineet     Dyspnea    On exertion   Ejection fraction    Essential hypertension 02/16/2007   Qualifier: Diagnosis of  By: Earlene Plater CMA, Tammy     G E R D 03/23/2007   Qualifier: Diagnosis of  By: Craige Cotta MD, Vineet     Gall bladder disease 04/28/2016   Gastritis and gastroduodenitis    GERD (gastroesophageal reflux disease)    Goiter    hx of   Headache(784.0)    migraine   Hyperlipidemia    Hypertension    Hypothyroidism    HYPOTHYROIDISM, POSTSURGICAL 06/04/2008   Qualifier: Diagnosis of  By: Everardo All MD, Sean A    Left ventricular hypertrophy    Leg edema    occasional swelling   Leg swelling 12/09/2018   Myofascial pain syndrome, cervical 01/06/2018   Obesity    OBESITY 11/24/2008   Qualifier: Diagnosis of  By: Vikki Ports     OBSTRUCTIVE SLEEP APNEA 02/16/2007   Auto CPAP  09/03/13 to 10/02/13 >> used on 30 of 30 nights with average 6 hrs and 3 min.  Average AHI is 3.1 with median CPAP 5 cm H2O and 95 th percentile CPAP 6 cm H2O.     Occipital neuralgia of right side 01/06/2018   OSA (obstructive sleep apnea)    cpap setting of 12   Pain in joint of right hip 01/21/2018   Palpitations 01/21/2011   48 hour Holter, 2015, scattered PACs and PVCs.    Paroxysmal atrial fibrillation (HCC)    Pneumonia    PONV (postoperative nausea and vomiting)    severe after every surgery   Pulmonary hypertension, secondary    RHINITIS 07/21/2010        RUQ abdominal pain    S/P left TKA 07/27/2011   Sinus node dysfunction (HCC) 11/08/2015    Sleep apnea    Spinal stenosis of cervical region 11/07/2014   Urinary frequency 12/09/2018   UTI (urinary tract infection) 08/15/2019   per pt report   VENTRICULAR HYPERTROPHY, LEFT 11/24/2008   Qualifier: Diagnosis of  By: Vikki Ports       Significant Hospital Events: Including procedures, antibiotic start and stop dates in addition to other pertinent events     Interim History / Subjective:  Overnight patient continued to complain of shortness of breath, was agitated and restless Several times pulled her BiPAP mask off Currently on nasal cannula oxygen with improved mental status, stated breathing is better Also noted to be hypotensive overnight with MAP in 50s, requiring reinitiation of dopamine She is off Cardene She remained afebrile  Objective   Blood pressure (!) 155/38, pulse (!) 57, temperature 98.1 F (36.7 C), temperature source Oral, resp. rate 18, height 5\' 5"  (1.651 m), weight 110.1 kg, SpO2 95 %. CVP:  [9 mmHg] 9 mmHg  Vent Mode: BIPAP;PCV FiO2 (%):  [40 %] 40 % Set Rate:  [12 bmp] 12 bmp PEEP:  [5 cmH20] 5 cmH20   Intake/Output Summary (Last 24 hours) at 10/27/2022 0835 Last data filed at 10/27/2022 0600 Gross per 24 hour  Intake 881.06 ml  Output 1382.5 ml  Net -501.44 ml   Filed Weights   10/25/22 1103 10/26/22 0600 10/27/22 0500  Weight: 110.2 kg 108.7 kg 110.1 kg    Examination: Physical exam: General: Acute on chronically ill-appearing morbidly obese female, lying on the bed HEENT: Albert/AT, eyes anicteric.  moist mucus membranes.  On nasal cannula oxygen Neuro: Alert, awake following commands, moving all 4 extremities Chest: Bilateral coarse crackles at bases, no wheezes or rhonchi Heart: Bradycardic regular rate and rhythm, systolic murmur Abdomen: Soft, nontender, nondistended, bowel sounds present Skin: No rash  Labs and images were reviewed  Resolved Hospital Problem list   Mixed metabolic and respiratory  acidosis Hyperkalemia/hyponatremia  Assessment & Plan:  Acute on chronic hypoxic/hypercapnic respiratory failure Acute on chronic HFpEF AKI on CKD stage IV Acute metabolic/uremic encephalopathy, improving Sepsis due to community-acquired pneumonia Bradycardia/junctional rhythm, history of sick sinus syndrome Paroxysmal A-fib Anemia of chronic disease  She is off BiPAP now, last night she should use BiPAP again Currently on 4 L nasal cannula oxygen which is close to her baseline Hypercapnia has cleared Trend ABGs Mental status is better today, was agitated overnight CRRT was stopped yesterday due to access issues Per discussion with nephrology will try high-dose Lasix 120 mg twice daily to see if she continues to make good amount of urine, if not then will replace HD catheter and start CRRT again Started  on Mucinex to help her cough up Continue Zosyn for now Follow-up cultures Dopamine was stopped Appreciate cardiology follow-up, recommend no need for pacemaker placement Patient remained in sinus rhythm with bradycardia, heart rate in mid 50s Patient is on Eliquis at home for stroke prophylaxis, currently on hold Continue heparin infusion for now Closely monitor electrolytes Monitor intake and output Avoid nephrotoxic agents Monitor H&H  Best Practice (right click and "Reselect all SmartList Selections" daily)   Diet/type: NPO bedside speech and swallow evaluation DVT prophylaxis: Heparin gtt GI prophylaxis: N/A Lines: Dialysis Catheter and Arterial Line Foley:  Yes, and it is still needed Code Status:  DNR Last date of multidisciplinary goals of care discussion [6/23: Goals of care discussions were carried with patient's husband, he decided to continue full scope of care while keeping her DNR/DNI]  Labs   CBC: Recent Labs  Lab 10/25/22 0730 10/25/22 0745 10/25/22 1601 10/26/22 0207 10/26/22 0213 10/26/22 1722 10/27/22 0420  WBC 18.3*  --   --  11.6*  --   --   13.6*  NEUTROABS 15.2*  --   --   --   --   --   --   HGB 8.4*   < > 9.2* 8.1* 8.8* 8.8* 7.6*  HCT 28.6*   < > 27.0* 27.3* 26.0* 26.0* 25.3*  MCV 97.3  --   --  94.1  --   --  93.7  PLT 478*  --   --  422*  --   --  391   < > = values in this interval not displayed.    Basic Metabolic Panel: Recent Labs  Lab 10/25/22 0730 10/25/22 0745 10/25/22 1555 10/25/22 1601 10/26/22 0207 10/26/22 0213 10/26/22 1610 10/26/22 1722 10/27/22 0420  NA 134*   < > 134*   < > 135 137 133* 134* 135  K 6.6*   < > 6.6*   < > 4.5 4.8 4.4 4.9 4.3  CL 99  --  99  --  103  --  100  --  100  CO2 19*  --  21*  --  22  --  26  --  25  GLUCOSE 207*  --  164*  --  73  --  167*  --  86  BUN 120*  --  106*  --  63*  --  47*  --  55*  CREATININE 5.06*  --  4.45*  --  2.73*  --  2.34*  --  2.70*  CALCIUM 8.9  --  8.5*  --  7.6*  --  7.8*  --  8.0*  MG  --   --   --   --  2.1  --   --   --  2.1  PHOS  --   --  5.8*  --  3.9  --  3.7  --  4.5   < > = values in this interval not displayed.   GFR: Estimated Creatinine Clearance: 20.2 mL/min (A) (by C-G formula based on SCr of 2.7 mg/dL (H)). Recent Labs  Lab 10/25/22 0730 10/25/22 0748 10/25/22 1016 10/26/22 0207 10/27/22 0420  PROCALCITON  --   --  0.16  --   --   WBC 18.3*  --   --  11.6* 13.6*  LATICACIDVEN  --  0.6  --   --   --     Liver Function Tests: Recent Labs  Lab 10/25/22 1555 10/26/22 0207 10/26/22 1610 10/27/22 0420  AST  --  14*  --   --   ALT  --  18  --   --   ALKPHOS  --  49  --   --   BILITOT  --  0.5  --   --   PROT  --  6.0*  --   --   ALBUMIN 2.5* 2.2* 2.1* 2.0*   No results for input(s): "LIPASE", "AMYLASE" in the last 168 hours. No results for input(s): "AMMONIA" in the last 168 hours.  ABG    Component Value Date/Time   PHART 7.394 10/26/2022 1722   PCO2ART 42.6 10/26/2022 1722   PO2ART 137 (H) 10/26/2022 1722   HCO3 26.0 10/26/2022 1722   TCO2 27 10/26/2022 1722   ACIDBASEDEF 5.0 (H) 10/25/2022 1601   O2SAT  99 10/26/2022 1722     Coagulation Profile: No results for input(s): "INR", "PROTIME" in the last 168 hours.  Cardiac Enzymes: No results for input(s): "CKTOTAL", "CKMB", "CKMBINDEX", "TROPONINI" in the last 168 hours.  HbA1C: Hgb A1c MFr Bld  Date/Time Value Ref Range Status  09/04/2022 01:29 AM 6.8 (H) 4.8 - 5.6 % Final    Comment:    (NOTE) Pre diabetes:          5.7%-6.4%  Diabetes:              >6.4%  Glycemic control for   <7.0% adults with diabetes   05/30/2021 10:23 AM 8.4 (H) 4.8 - 5.6 % Final    Comment:    (NOTE) Pre diabetes:          5.7%-6.4%  Diabetes:              >6.4%  Glycemic control for   <7.0% adults with diabetes     CBG: Recent Labs  Lab 10/26/22 2033 10/27/22 0018 10/27/22 0436 10/27/22 0740 10/27/22 0812  GLUCAP 162* 138* 85 68* 118*    Critical care time:     The patient is critically ill due to acute renal failure/acute hypoxic/hypercapnic respiratory failure, Sepsis due to pneumonia.  Critical care was necessary to treat or prevent imminent or life-threatening deterioration.  Critical care was time spent personally by me on the following activities: development of treatment plan with patient and/or surrogate as well as nursing, discussions with consultants, evaluation of patient's response to treatment, examination of patient, obtaining history from patient or surrogate, ordering and performing treatments and interventions, ordering and review of laboratory studies, ordering and review of radiographic studies, pulse oximetry, re-evaluation of patient's condition and participation in multidisciplinary rounds.   During this encounter critical care time was devoted to patient care services described in this note for 36 minutes.     Cheri Fowler, MD [facility] See Amion for pager If no response to pager, please call [phone] until 7pm After 7pm, Please call E-link [phone]

## 2022-10-27 NOTE — Progress Notes (Signed)
ANTICOAGULATION CONSULT NOTE - Follow Up Consult  Pharmacy Consult for heparin Indication: atrial fibrillation  Labs: Recent Labs    10/25/22 0730 10/25/22 0745 10/25/22 1016 10/25/22 1555 10/25/22 1601 10/26/22 0207 10/26/22 0213 10/26/22 1610 10/26/22 1722 10/26/22 1830 10/27/22 0420  HGB 8.4*   < >  --   --    < > 8.1* 8.8*  --  8.8*  --  7.6*  HCT 28.6*   < >  --   --    < > 27.3* 26.0*  --  26.0*  --  25.3*  PLT 478*  --   --   --   --  422*  --   --   --   --  391  APTT  --   --   --   --   --  29  --   --   --  42* 58*  HEPARINUNFRC  --   --   --   --   --   --   --   --   --  >1.10* >1.10*  CREATININE 5.06*  --   --  4.45*  --  2.73*  --  2.34*  --   --   --   TROPONINIHS 33*  --  38*  --   --   --   --   --   --   --   --    < > = values in this interval not displayed.    Assessment: 81yo female subtherapeutic on heparin after rate change; no infusion issues per RN; Hgb down (9.2>8.1>8.8>7.6) but confirmed with RN that there are no overt signs of bleeding.  Goal of Therapy:  aPTT 66-102 seconds   Plan:  Increase heparin infusion cautiously to 1400 units/hr. Check PTT in 8 hours.   Vernard Gambles, PharmD, BCPS 10/27/2022 5:12 AM

## 2022-10-27 NOTE — Progress Notes (Signed)
[facility] Progress Note    Assessment/ Plan:   Acute kidney injury on CKD stage IV likely due to CHF exacerbation/fluid overload/bradycardia leading to reduced renal perfusion.  B/L Cr around 2, followed by me as an outpatient. She had hyperkalemia, anasarca and did not respond with IV diuretics.  Also had features of uremic encephalopathy including tremor, AMS. -temp HD catheter placed on 6/23, started CRRT 6/23. CRRT off on 6/24 given access issues/alarms. -will start lasix 120mg  IV BID. Discussed with CCM, if any issues again respiratory wise then will need new access and re-initiation of CRRT (low threshold) -daily labs, strict I/O   Hyperkalemia: Received medical treatment in the ER.  Improved with CRRT (currently off). K WNL this AM, starting lasix   Bradycardia: Seen by cardiologist.  On dopamine gtt, HR stable   Acute respiratory failure/respiratory distress with hypoxia: lasix as above. Low threshold for CRRT as above   Sepsis due to possible pneumonia: On antibiotics per primary team.  Hyponatremia, mild: likely secondary to AKI and hypervolemia. WNL now  Anemia: transfuse for Hgb <7. Will start ESA  Discussed with primary service.  Subjective:   Patient seen and examined bedside in ICU. Husband at bedside.  The last 24 hours were eventful: Access issues with CRRT therefore CRRT was stopped.  Became quite hypertensive thereafter required a brief course of Cardene but then hypotensive and bradycardic therefore restarted on dopamine which she is currently on.  She did receive Lasix 80 mg and 60 mg IV.  Required BiPAP which she took off earlier this morning. Husband reports that she is looking better. She does endorse some SOB but better as compared to yesterday.   Objective:   BP (!) 155/38   Pulse (!) 57   Temp 98.1 F (36.7 C) (Oral)   Resp 18   Ht 5\' 5"  (1.651 m)   Wt 110.1 kg   SpO2 95%   BMI 40.39 kg/m   Intake/Output Summary (Last 24 hours) at  10/27/2022 0758 Last data filed at 10/27/2022 0600 Gross per 24 hour  Intake 930.18 ml  Output 1652.4 ml  Net -722.22 ml   Weight change: 5.773 kg  Physical Exam: Gen: ill appearing, laying flat in bed CVS: RRR Resp: bibasilar rales, on [place] Abd: soft, nt/nd Ext: 1+ pitting edema b/l Les Neuro: awake, alert, no asterixis Dialysis access: RIJ temp line c/d/i  Imaging: ECHOCARDIOGRAM LIMITED  Result Date: 10/25/2022    ECHOCARDIOGRAM LIMITED REPORT   Patient Name:   Kayla [name] Date of Exam: 10/25/2022 Medical Rec #:  [id number]        Height:       65.0 in Accession #:    [id number]       Weight:       230.0 lb Date of Birth:  09-Apr-1941       BSA:          2.099 m Patient Age:    81 years         BP:           88/55 mmHg Patient Gender: F                HR:           38 bpm. Exam Location:  Inpatient Procedure: Limited Echo Indications:    R06.9 DOE  History:        Patient has prior history of Echocardiogram examinations, most  recent 09/07/2022. COPD, Arrythmias:Atrial Fibrillation; Risk                 Factors:Hypertension, Diabetes, Dyslipidemia and Sleep Apnea.                 Infected Pacemaker lead removed 09/08/22 without re-implant.  Sonographer:    Irving Burton Senior RDCS Referring Phys: [id number] Perlie Gold  Sonographer Comments: Limited for LV function, scanned upright on bipap for dyspnea IMPRESSIONS  1. Left ventricular ejection fraction, by estimation, is 70 to 75%. Left ventricular ejection fraction by PLAX is 66 %. The left ventricle has hyperdynamic function. The left ventricle has no regional wall motion abnormalities. There is mild concentric left ventricular hypertrophy.  2. There is mild calcification of the aortic valve. There is moderate thickening of the aortic valve. FINDINGS  Left Ventricle: Left ventricular ejection fraction, by estimation, is 70 to 75%. Left ventricular ejection fraction by PLAX is 66 %. The left ventricle has hyperdynamic function. The left  ventricle has no regional wall motion abnormalities. The left ventricular internal cavity size was normal in size. There is mild concentric left ventricular hypertrophy. Pericardium: There is no evidence of pericardial effusion. Mitral Valve: Mild to moderate mitral annular calcification. Aortic Valve: There is mild calcification of the aortic valve. There is moderate thickening of the aortic valve. LEFT VENTRICLE PLAX 2D LV EF:         Left ventricular ejection fraction by PLAX is 66 %. LVIDd:         4.90 cm LVIDs:         3.10 cm LV PW:         1.20 cm LV IVS:        1.20 cm  Mihai Croitoru MD Electronically signed by Thurmon Fair MD Signature Date/Time: 10/25/2022/12:43:51 PM    Final     Labs: BMET Recent Labs  Lab 10/25/22 4540 10/25/22 0730 10/25/22 0745 10/25/22 1555 10/25/22 1601 10/26/22 0207 10/26/22 0213 10/26/22 1610 10/26/22 1722 10/27/22 0420  NA 133*  133* 134*   < > 134* 134* 135 137 133* 134* 135  K 6.5*  6.6* 6.6*   < > 6.6* 6.5* 4.5 4.8 4.4 4.9 4.3  CL 105 99  --  99  --  103  --  100  --  100  CO2  --  19*  --  21*  --  22  --  26  --  25  GLUCOSE 198* 207*  --  164*  --  73  --  167*  --  86  BUN >130* 120*  --  106*  --  63*  --  47*  --  55*  CREATININE 5.40* 5.06*  --  4.45*  --  2.73*  --  2.34*  --  2.70*  CALCIUM  --  8.9  --  8.5*  --  7.6*  --  7.8*  --  8.0*  PHOS  --   --   --  5.8*  --  3.9  --  3.7  --  4.5   < > = values in this interval not displayed.   CBC Recent Labs  Lab 10/25/22 0730 10/25/22 0745 10/26/22 0207 10/26/22 0213 10/26/22 1722 10/27/22 0420  WBC 18.3*  --  11.6*  --   --  13.6*  NEUTROABS 15.2*  --   --   --   --   --   HGB 8.4*   < > 8.1* 8.8* 8.8* 7.6*  HCT 28.6*   < > 27.3* 26.0* 26.0* 25.3*  MCV 97.3  --  94.1  --   --  93.7  PLT 478*  --  422*  --   --  391   < > = values in this interval not displayed.    Medications:     Chlorhexidine Gluconate Cloth  6 each Topical Daily   cloNIDine  0.2 mg Transdermal  Weekly   insulin aspart  0-15 Units Subcutaneous Q4H   multivitamin  1 tablet Per Tube QHS   mouth rinse  15 mL Mouth Rinse 4 times per day   sodium chloride flush  3 mL Intravenous Q12H      Anthony Sar, MD [facility] 10/27/2022, 7:58 AM

## 2022-10-27 NOTE — Progress Notes (Signed)
DAILY PROGRESS NOTE   Patient Name: Kayla [name] Date of Encounter: 10/27/2022 Cardiologist: Donato Schultz, MD  Chief Complaint   No complaints  Patient Profile   Kayla [name] is a 82 y.o. female with a hx of obesity, type 2 diabetes, hypertension, hyperlipidemia, CAD, congestive heart failure, paroxysmal atrial fibrillation (on Eliquis), sick sinus syndrome status post pacemaker, CKD, anemia, COPD, GERD, hypothyroidism, chronic venous insufficiency, status post left first ray amputation (09/04/2022) with active osteomyelitis who is being seen 10/25/2022 for the evaluation of bradycardia at the request of Dr. Rodena Medin.   Subjective   Events noted overnight - issues with CRRT, discontinued, became hypertensive, on cardene, then hypotensive on dopamine. HR in the 40-50's currently off dopamine, getting IV lasix and heparin.  Objective   Vitals:   10/27/22 0745 10/27/22 0800 10/27/22 0815 10/27/22 0830  BP:      Pulse: (!) 57 (!) 54 (!) 56 (!) 58  Resp: 19 20 19  (!) 21  Temp: 98.1 F (36.7 C)     TempSrc: Oral     SpO2: 96% 96% 98% 97%  Weight:      Height:        Intake/Output Summary (Last 24 hours) at 10/27/2022 1610 Last data filed at 10/27/2022 0700 Gross per 24 hour  Intake 887.67 ml  Output 1652.5 ml  Net -764.83 ml   Filed Weights   10/25/22 1103 10/26/22 0600 10/27/22 0500  Weight: 110.2 kg 108.7 kg 110.1 kg    Physical Exam   General appearance: alert, no distress, morbidly obese, and pale Neck: JVD - 5 cm above sternal notch, no carotid bruit, and thyroid not enlarged, symmetric, no tenderness/mass/nodules Lungs: diminished breath sounds bibasilar and bilaterally Heart: regular rate and rhythm Abdomen: soft, non-tender; bowel sounds normal; no masses,  no organomegaly Extremities: edema 2+ bilateral LE Pulses: 2+ and symmetric Skin: Skin color, texture, turgor normal. No rashes or lesions Neurologic: Alert and oriented X 3, normal strength and tone.  Normal symmetric reflexes. Normal coordination and gait Psych: Pleasant  Inpatient Medications    Scheduled Meds:  bisacodyl  10 mg Rectal Daily   Or   bisacodyl  10 mg Oral Daily   Chlorhexidine Gluconate Cloth  6 each Topical Daily   cloNIDine  0.2 mg Transdermal Weekly   [START ON 10/28/2022] darbepoetin (ARANESP) injection - NON-DIALYSIS  60 mcg Subcutaneous Q Wed-1800   guaiFENesin  600 mg Oral BID   insulin aspart  0-15 Units Subcutaneous Q4H   multivitamin  1 tablet Per Tube QHS   mouth rinse  15 mL Mouth Rinse 4 times per day   sodium chloride flush  3 mL Intravenous Q12H    Continuous Infusions:  sodium chloride Stopped (10/26/22 1934)   dextrose Stopped (10/26/22 1630)   furosemide 120 mg (10/27/22 0925)   heparin 1,400 Units/hr (10/27/22 0728)   piperacillin-tazobactam (ZOSYN)  IV     prismasol BGK 2/2.5 dialysis solution 1,500 mL/hr at 10/26/22 1224   prismasol BGK 2/2.5 replacement solution 400 mL/hr at 10/26/22 0154   prismasol BGK 2/2.5 replacement solution 400 mL/hr at 10/26/22 0156    PRN Meds: sodium chloride, acetaminophen **OR** acetaminophen, albuterol, heparin, hydrALAZINE, mouth rinse   Labs   Results for orders placed or performed during the hospital encounter of 10/25/22 (from the past 48 hour(s))  Troponin I (High Sensitivity)     Status: Abnormal   Collection Time: 10/25/22 10:16 AM  Result Value Ref Range   Troponin I (  High Sensitivity) 38 (H) <18 ng/L    Comment: (NOTE) Elevated high sensitivity troponin I (hsTnI) values and significant  changes across serial measurements may suggest ACS but many other  chronic and acute conditions are known to elevate hsTnI results.  Refer to the "Links" section for chest pain algorithms and additional  guidance. Performed at [facility], [address], [place], [place] [address]   Procalcitonin     Status: None   Collection Time: 10/25/22 10:16 AM  Result Value Ref Range   Procalcitonin 0.16  ng/mL    Comment:        Interpretation: PCT (Procalcitonin) <= 0.5 ng/mL: Systemic infection (sepsis) is not likely. Local bacterial infection is possible. (NOTE)       Sepsis PCT Algorithm           Lower Respiratory Tract                                      Infection PCT Algorithm    ----------------------------     ----------------------------         PCT < 0.25 ng/mL                PCT < 0.10 ng/mL          Strongly encourage             Strongly discourage   discontinuation of antibiotics    initiation of antibiotics    ----------------------------     -----------------------------       PCT 0.25 - 0.50 ng/mL            PCT 0.10 - 0.25 ng/mL               OR       >80% decrease in PCT            Discourage initiation of                                            antibiotics      Encourage discontinuation           of antibiotics    ----------------------------     -----------------------------         PCT >= 0.50 ng/mL              PCT 0.26 - 0.50 ng/mL               AND        <80% decrease in PCT             Encourage initiation of                                             antibiotics       Encourage continuation           of antibiotics    ----------------------------     -----------------------------        PCT >= 0.50 ng/mL                  PCT > 0.50 ng/mL  AND         increase in PCT                  Strongly encourage                                      initiation of antibiotics    Strongly encourage escalation           of antibiotics                                     -----------------------------                                           PCT <= 0.25 ng/mL                                                 OR                                        > 80% decrease in PCT                                      Discontinue / Do not initiate                                             antibiotics  Performed at [facility], [address], [place], [place] [address]   MRSA Next Gen by PCR, Nasal     Status: None   Collection Time: 10/25/22 11:46 AM   Specimen: Nasal Mucosa; Nasal Swab  Result Value Ref Range   MRSA by PCR Next Gen NOT DETECTED NOT DETECTED    Comment: (NOTE) The GeneXpert MRSA Assay (FDA approved for NASAL specimens only), is one component of a comprehensive MRSA colonization surveillance program. It is not intended to diagnose MRSA infection nor to guide or monitor treatment for MRSA infections. Test performance is not FDA approved in patients less than 60 years old. Performed at [facility], [address], [place], [place] [address]   Glucose, capillary     Status: Abnormal   Collection Time: 10/25/22 12:12 PM  Result Value Ref Range   Glucose-Capillary 159 (H) 70 - 99 mg/dL    Comment: Glucose reference range applies only to samples taken after fasting for at least 8 hours.  Renal function panel (daily at 1600)     Status: Abnormal   Collection Time: 10/25/22  3:55 PM  Result Value Ref Range   Sodium 134 (L) 135 - 145 mmol/L   Potassium 6.6 (HH) 3.5 - 5.1 mmol/L    Comment: CRITICAL RESULT CALLED TO, READ BACK BY AND VERIFIED WITH Morrison Old RN AT [phone] [id number] BY D LONG   Chloride 99  98 - 111 mmol/L   CO2 21 (L) 22 - 32 mmol/L   Glucose, Bld 164 (H) 70 - 99 mg/dL    Comment: Glucose reference range applies only to samples taken after fasting for at least 8 hours.   BUN 106 (H) 8 - 23 mg/dL   Creatinine, Ser 1.61 (H) 0.44 - 1.00 mg/dL   Calcium 8.5 (L) 8.9 - 10.3 mg/dL   Phosphorus 5.8 (H) 2.5 - 4.6 mg/dL   Albumin 2.5 (L) 3.5 - 5.0 g/dL   GFR, Estimated 9 (L) >60 mL/min    Comment: (NOTE) Calculated using the CKD-EPI Creatinine Equation (2021)    Anion gap 14 5 - 15    Comment: Performed at [facility], [address], [place], [place] [address]  Glucose, capillary     Status: Abnormal   Collection Time: 10/25/22  3:59 PM  Result Value Ref Range   Glucose-Capillary 144 (H)  70 - 99 mg/dL    Comment: Glucose reference range applies only to samples taken after fasting for at least 8 hours.  I-STAT 7, (LYTES, BLD GAS, ICA, H+H)     Status: Abnormal   Collection Time: 10/25/22  4:01 PM  Result Value Ref Range   pH, Arterial 7.239 (L) 7.35 - 7.45   pCO2 arterial 52.8 (H) 32 - 48 mmHg   pO2, Arterial 80 (L) 83 - 108 mmHg   Bicarbonate 23.0 20.0 - 28.0 mmol/L   TCO2 25 22 - 32 mmol/L   O2 Saturation 94 %   Acid-base deficit 5.0 (H) 0.0 - 2.0 mmol/L   Sodium 134 (L) 135 - 145 mmol/L   Potassium 6.5 (HH) 3.5 - 5.1 mmol/L   Calcium, Ion 1.20 1.15 - 1.40 mmol/L   HCT 27.0 (L) 36.0 - 46.0 %   Hemoglobin 9.2 (L) 12.0 - 15.0 g/dL   Patient temperature 54.0 F    Sample type ARTERIAL    Comment NOTIFIED PHYSICIAN   Glucose, capillary     Status: None   Collection Time: 10/25/22  7:47 PM  Result Value Ref Range   Glucose-Capillary 84 70 - 99 mg/dL    Comment: Glucose reference range applies only to samples taken after fasting for at least 8 hours.  Glucose, capillary     Status: Abnormal   Collection Time: 10/25/22 11:36 PM  Result Value Ref Range   Glucose-Capillary 46 (L) 70 - 99 mg/dL    Comment: Glucose reference range applies only to samples taken after fasting for at least 8 hours.  Glucose, capillary     Status: Abnormal   Collection Time: 10/26/22 12:07 AM  Result Value Ref Range   Glucose-Capillary 100 (H) 70 - 99 mg/dL    Comment: Glucose reference range applies only to samples taken after fasting for at least 8 hours.  Comprehensive metabolic panel     Status: Abnormal   Collection Time: 10/26/22  2:07 AM  Result Value Ref Range   Sodium 135 135 - 145 mmol/L   Potassium 4.5 3.5 - 5.1 mmol/L   Chloride 103 98 - 111 mmol/L   CO2 22 22 - 32 mmol/L   Glucose, Bld 73 70 - 99 mg/dL    Comment: Glucose reference range applies only to samples taken after fasting for at least 8 hours.   BUN 63 (H) 8 - 23 mg/dL   Creatinine, Ser 9.81 (H) 0.44 - 1.00 mg/dL    Calcium 7.6 (L) 8.9 - 10.3 mg/dL   Total Protein  6.0 (L) 6.5 - 8.1 g/dL   Albumin 2.2 (L) 3.5 - 5.0 g/dL   AST 14 (L) 15 - 41 U/L   ALT 18 0 - 44 U/L   Alkaline Phosphatase 49 38 - 126 U/L   Total Bilirubin 0.5 0.3 - 1.2 mg/dL   GFR, Estimated 17 (L) >60 mL/min    Comment: (NOTE) Calculated using the CKD-EPI Creatinine Equation (2021)    Anion gap 10 5 - 15    Comment: Performed at [facility], [address], [place], [place] [address]  CBC     Status: Abnormal   Collection Time: 10/26/22  2:07 AM  Result Value Ref Range   WBC 11.6 (H) 4.0 - 10.5 K/uL   RBC 2.90 (L) 3.87 - 5.11 MIL/uL   Hemoglobin 8.1 (L) 12.0 - 15.0 g/dL   HCT 60.4 (L) 54.0 - 98.1 %   MCV 94.1 80.0 - 100.0 fL   MCH 27.9 26.0 - 34.0 pg   MCHC 29.7 (L) 30.0 - 36.0 g/dL   RDW 19.1 (H) 47.8 - 29.5 %   Platelets 422 (H) 150 - 400 K/uL   nRBC 0.2 0.0 - 0.2 %    Comment: Performed at [facility], [address], [place], [place] [address]  Magnesium     Status: None   Collection Time: 10/26/22  2:07 AM  Result Value Ref Range   Magnesium 2.1 1.7 - 2.4 mg/dL    Comment: Performed at [facility], [address], [place], [place] [address]  APTT     Status: None   Collection Time: 10/26/22  2:07 AM  Result Value Ref Range   aPTT 29 24 - 36 seconds    Comment: Performed at [facility], [address], [place], [place] [address]  Phosphorus     Status: None   Collection Time: 10/26/22  2:07 AM  Result Value Ref Range   Phosphorus 3.9 2.5 - 4.6 mg/dL    Comment: Performed at [facility], [address] [place], [place] [address]  Glucose, capillary     Status: None   Collection Time: 10/26/22  2:09 AM  Result Value Ref Range   Glucose-Capillary 73 70 - 99 mg/dL    Comment: Glucose reference range applies only to samples taken after fasting for at least 8 hours.  I-STAT 7, (LYTES, BLD GAS, ICA, H+H)     Status: Abnormal   Collection Time: 10/26/22  2:13 AM  Result Value Ref  Range   pH, Arterial 7.308 (L) 7.35 - 7.45   pCO2 arterial 54.7 (H) 32 - 48 mmHg   pO2, Arterial 92 83 - 108 mmHg   Bicarbonate 27.5 20.0 - 28.0 mmol/L   TCO2 29 22 - 32 mmol/L   O2 Saturation 96 %   Acid-Base Excess 1.0 0.0 - 2.0 mmol/L   Sodium 137 135 - 145 mmol/L   Potassium 4.8 3.5 - 5.1 mmol/L   Calcium, Ion 1.18 1.15 - 1.40 mmol/L   HCT 26.0 (L) 36.0 - 46.0 %   Hemoglobin 8.8 (L) 12.0 - 15.0 g/dL   Patient temperature 13.2 C    Sample type ARTERIAL   Glucose, capillary     Status: Abnormal   Collection Time: 10/26/22  3:14 AM  Result Value Ref Range   Glucose-Capillary 61 (L) 70 - 99 mg/dL    Comment: Glucose reference range applies only to samples taken after fasting for at least 8  hours.  Glucose, capillary     Status: None   Collection Time: 10/26/22  3:39 AM  Result Value Ref Range   Glucose-Capillary 88 70 - 99 mg/dL    Comment: Glucose reference range applies only to samples taken after fasting for at least 8 hours.  Glucose, capillary     Status: Abnormal   Collection Time: 10/26/22  6:24 AM  Result Value Ref Range   Glucose-Capillary 58 (L) 70 - 99 mg/dL    Comment: Glucose reference range applies only to samples taken after fasting for at least 8 hours.  Glucose, capillary     Status: Abnormal   Collection Time: 10/26/22  7:02 AM  Result Value Ref Range   Glucose-Capillary 108 (H) 70 - 99 mg/dL    Comment: Glucose reference range applies only to samples taken after fasting for at least 8 hours.  Glucose, capillary     Status: Abnormal   Collection Time: 10/26/22  8:09 AM  Result Value Ref Range   Glucose-Capillary 122 (H) 70 - 99 mg/dL    Comment: Glucose reference range applies only to samples taken after fasting for at least 8 hours.  Glucose, capillary     Status: None   Collection Time: 10/26/22 12:07 PM  Result Value Ref Range   Glucose-Capillary 97 70 - 99 mg/dL    Comment: Glucose reference range applies only to samples taken after fasting for at  least 8 hours.  Glucose, capillary     Status: Abnormal   Collection Time: 10/26/22  4:09 PM  Result Value Ref Range   Glucose-Capillary 162 (H) 70 - 99 mg/dL    Comment: Glucose reference range applies only to samples taken after fasting for at least 8 hours.  Renal function panel (daily at 1600)     Status: Abnormal   Collection Time: 10/26/22  4:10 PM  Result Value Ref Range   Sodium 133 (L) 135 - 145 mmol/L   Potassium 4.4 3.5 - 5.1 mmol/L   Chloride 100 98 - 111 mmol/L   CO2 26 22 - 32 mmol/L   Glucose, Bld 167 (H) 70 - 99 mg/dL    Comment: Glucose reference range applies only to samples taken after fasting for at least 8 hours.   BUN 47 (H) 8 - 23 mg/dL   Creatinine, Ser 3.66 (H) 0.44 - 1.00 mg/dL   Calcium 7.8 (L) 8.9 - 10.3 mg/dL   Phosphorus 3.7 2.5 - 4.6 mg/dL   Albumin 2.1 (L) 3.5 - 5.0 g/dL   GFR, Estimated 20 (L) >60 mL/min    Comment: (NOTE) Calculated using the CKD-EPI Creatinine Equation (2021)    Anion gap 7 5 - 15    Comment: Performed at [facility], [address] [place], [place] [address]  I-STAT 7, (LYTES, BLD GAS, ICA, H+H)     Status: Abnormal   Collection Time: 10/26/22  5:22 PM  Result Value Ref Range   pH, Arterial 7.394 7.35 - 7.45   pCO2 arterial 42.6 32 - 48 mmHg   pO2, Arterial 137 (H) 83 - 108 mmHg   Bicarbonate 26.0 20.0 - 28.0 mmol/L   TCO2 27 22 - 32 mmol/L   O2 Saturation 99 %   Acid-Base Excess 1.0 0.0 - 2.0 mmol/L   Sodium 134 (L) 135 - 145 mmol/L   Potassium 4.9 3.5 - 5.1 mmol/L   Calcium, Ion 1.10 (L) 1.15 - 1.40 mmol/L   HCT 26.0 (L) 36.0 - 46.0 %  Hemoglobin 8.8 (L) 12.0 - 15.0 g/dL   Sample type ARTERIAL   Heparin level (unfractionated)     Status: Abnormal   Collection Time: 10/26/22  6:30 PM  Result Value Ref Range   Heparin Unfractionated >1.10 (H) 0.30 - 0.70 IU/mL    Comment: (NOTE) The clinical reportable range upper limit is being lowered to >1.10 to align with the FDA approved guidance for the current  laboratory assay.  If heparin results are below expected values, and patient dosage has  been confirmed, suggest follow up testing of antithrombin III levels. Performed at [facility], [address] [place], [place] [address]   APTT     Status: Abnormal   Collection Time: 10/26/22  6:30 PM  Result Value Ref Range   aPTT 42 (H) 24 - 36 seconds    Comment:        IF BASELINE aPTT IS ELEVATED, SUGGEST PATIENT RISK ASSESSMENT BE USED TO DETERMINE APPROPRIATE ANTICOAGULANT THERAPY. Performed at [facility], [address], [place], [place] [address]   Glucose, capillary     Status: Abnormal   Collection Time: 10/26/22  8:33 PM  Result Value Ref Range   Glucose-Capillary 162 (H) 70 - 99 mg/dL    Comment: Glucose reference range applies only to samples taken after fasting for at least 8 hours.  Glucose, capillary     Status: Abnormal   Collection Time: 10/27/22 12:18 AM  Result Value Ref Range   Glucose-Capillary 138 (H) 70 - 99 mg/dL    Comment: Glucose reference range applies only to samples taken after fasting for at least 8 hours.  Renal function panel (daily at 0500)     Status: Abnormal   Collection Time: 10/27/22  4:20 AM  Result Value Ref Range   Sodium 135 135 - 145 mmol/L   Potassium 4.3 3.5 - 5.1 mmol/L   Chloride 100 98 - 111 mmol/L   CO2 25 22 - 32 mmol/L   Glucose, Bld 86 70 - 99 mg/dL    Comment: Glucose reference range applies only to samples taken after fasting for at least 8 hours.   BUN 55 (H) 8 - 23 mg/dL   Creatinine, Ser 8.75 (H) 0.44 - 1.00 mg/dL   Calcium 8.0 (L) 8.9 - 10.3 mg/dL   Phosphorus 4.5 2.5 - 4.6 mg/dL   Albumin 2.0 (L) 3.5 - 5.0 g/dL   GFR, Estimated 17 (L) >60 mL/min    Comment: (NOTE) Calculated using the CKD-EPI Creatinine Equation (2021)    Anion gap 10 5 - 15    Comment: Performed at [facility], [address] [place], [place] [address]  Magnesium     Status: None   Collection Time: 10/27/22  4:20 AM  Result  Value Ref Range   Magnesium 2.1 1.7 - 2.4 mg/dL    Comment: Performed at [facility], [address] [place], [place] [address]  Iron and TIBC     Status: Abnormal   Collection Time: 10/27/22  4:20 AM  Result Value Ref Range   Iron 29 28 - 170 ug/dL   TIBC 416 (L) 606 - 301 ug/dL   Saturation Ratios 13 10.4 - 31.8 %   UIBC 195 ug/dL    Comment: Performed at [facility], [address], [place], [place] [address]  Ferritin     Status: None   Collection Time: 10/27/22  4:20 AM  Result Value Ref Range  Ferritin 78 11 - 307 ng/mL    Comment: Performed at [facility], [address], [place], [place] [address]  CBC     Status: Abnormal   Collection Time: 10/27/22  4:20 AM  Result Value Ref Range   WBC 13.6 (H) 4.0 - 10.5 K/uL   RBC 2.70 (L) 3.87 - 5.11 MIL/uL   Hemoglobin 7.6 (L) 12.0 - 15.0 g/dL   HCT 39.7 (L) 67.3 - 41.9 %   MCV 93.7 80.0 - 100.0 fL   MCH 28.1 26.0 - 34.0 pg   MCHC 30.0 30.0 - 36.0 g/dL   RDW 37.9 (H) 02.4 - 09.7 %   Platelets 391 150 - 400 K/uL   nRBC 0.0 0.0 - 0.2 %    Comment: Performed at [facility], [address], [place], [place] [address]  APTT     Status: Abnormal   Collection Time: 10/27/22  4:20 AM  Result Value Ref Range   aPTT 58 (H) 24 - 36 seconds    Comment:        IF BASELINE aPTT IS ELEVATED, SUGGEST PATIENT RISK ASSESSMENT BE USED TO DETERMINE APPROPRIATE ANTICOAGULANT THERAPY. Performed at [facility], [address], [place], [place] [address]   Heparin level (unfractionated)     Status: Abnormal   Collection Time: 10/27/22  4:20 AM  Result Value Ref Range   Heparin Unfractionated >1.10 (H) 0.30 - 0.70 IU/mL    Comment: (NOTE) The clinical reportable range upper limit is being lowered to >1.10 to align with the FDA approved guidance for the current laboratory assay.  If heparin results are below expected values, and patient dosage has  been confirmed, suggest follow up testing of antithrombin  III levels. Performed at [facility], [address] [place], [place] [address]   Glucose, capillary     Status: None   Collection Time: 10/27/22  4:36 AM  Result Value Ref Range   Glucose-Capillary 85 70 - 99 mg/dL    Comment: Glucose reference range applies only to samples taken after fasting for at least 8 hours.  Glucose, capillary     Status: Abnormal   Collection Time: 10/27/22  7:40 AM  Result Value Ref Range   Glucose-Capillary 68 (L) 70 - 99 mg/dL    Comment: Glucose reference range applies only to samples taken after fasting for at least 8 hours.  Glucose, capillary     Status: Abnormal   Collection Time: 10/27/22  8:12 AM  Result Value Ref Range   Glucose-Capillary 118 (H) 70 - 99 mg/dL    Comment: Glucose reference range applies only to samples taken after fasting for at least 8 hours.    ECG   N/A  Telemetry   Sinus bradycardia with some NSVT overnight- Personally Reviewed  Radiology    ECHOCARDIOGRAM LIMITED  Result Date: 10/25/2022    ECHOCARDIOGRAM LIMITED REPORT   Patient Name:   Kayla [name] Date of Exam: 10/25/2022 Medical Rec #:  [id number]        Height:       65.0 in Accession #:    [id number]       Weight:       230.0 lb Date of Birth:  [birth year]       BSA:          2.099 m Patient Age:    81 years         BP:  88/55 mmHg Patient Gender: F                HR:           38 bpm. Exam Location:  Inpatient Procedure: Limited Echo Indications:    R06.9 DOE  History:        Patient has prior history of Echocardiogram examinations, most                 recent 09/07/2022. COPD, Arrythmias:Atrial Fibrillation; Risk                 Factors:Hypertension, Diabetes, Dyslipidemia and Sleep Apnea.                 Infected Pacemaker lead removed 09/08/22 without re-implant.  Sonographer:    Irving Burton Senior RDCS Referring Phys: [id number] Perlie Gold  Sonographer Comments: Limited for LV function, scanned upright on bipap for dyspnea IMPRESSIONS  1. Left ventricular  ejection fraction, by estimation, is 70 to 75%. Left ventricular ejection fraction by PLAX is 66 %. The left ventricle has hyperdynamic function. The left ventricle has no regional wall motion abnormalities. There is mild concentric left ventricular hypertrophy.  2. There is mild calcification of the aortic valve. There is moderate thickening of the aortic valve. FINDINGS  Left Ventricle: Left ventricular ejection fraction, by estimation, is 70 to 75%. Left ventricular ejection fraction by PLAX is 66 %. The left ventricle has hyperdynamic function. The left ventricle has no regional wall motion abnormalities. The left ventricular internal cavity size was normal in size. There is mild concentric left ventricular hypertrophy. Pericardium: There is no evidence of pericardial effusion. Mitral Valve: Mild to moderate mitral annular calcification. Aortic Valve: There is mild calcification of the aortic valve. There is moderate thickening of the aortic valve. LEFT VENTRICLE PLAX 2D LV EF:         Left ventricular ejection fraction by PLAX is 66 %. LVIDd:         4.90 cm LVIDs:         3.10 cm LV PW:         1.20 cm LV IVS:        1.20 cm  Mihai Croitoru MD Electronically signed by Thurmon Fair MD Signature Date/Time: 10/25/2022/12:43:51 PM    Final     Cardiac Studies   N/A  Assessment   Principal Problem:   Acute respiratory failure with hypoxia and hypercapnia (HCC) Active Problems:   AKI (acute kidney injury) (HCC)   Junctional bradycardia   Hyperkalemia   Plan   HR remains in the 40-50's, stable - no indication for pacing. Receiving IV lasix, awaiting renal recovery. Could transition back to DOAC - no plans for cardiac intervention.  TIME SPENT WITH PATIENT: 25 minutes of direct patient care. More than 50% of that time was spent on coordination of care and counseling regarding bradycardia, AKI, hyperkalemia  Length of Stay:  LOS: 2 days   Chrystie Nose, MD, [facility], FACP  [place]  [facility]  HeartCare  Medical Director of the [facility] Lipid [facility] &  Cardiovascular Risk Reduction Clinic Diplomate of the American Board of Clinical Lipidology Attending Cardiologist  Direct Dial: [phone]  Fax: [phone]  Website:  [email or web address] 10/27/2022, 9:39 AM

## 2022-10-27 NOTE — Inpatient Diabetes Management (Signed)
Inpatient Diabetes Program Recommendations  AACE/ADA: New Consensus Statement on Inpatient Glycemic Control (2015)  Target Ranges:  Prepandial:   less than 140 mg/dL      Peak postprandial:   less than 180 mg/dL (1-2 hours)      Critically ill patients:  140 - 180 mg/dL   Lab Results  Component Value Date   GLUCAP 118 (H) 10/27/2022   HGBA1C 6.8 (H) 09/04/2022    Latest Reference Range & Units 10/27/22 00:18 10/27/22 04:36 10/27/22 07:40 10/27/22 08:12  Glucose-Capillary 70 - 99 mg/dL 098 (H) 85 68 (L) 119 (H)  (H): Data is abnormally high (L): Data is abnormally low Review of Glycemic Control  Diabetes history: Type 2 DM Outpatient Diabetes medications: Novolog 0-20 units TID, Lantus 25 units QA/50 units QP Current orders for Inpatient glycemic control: Novolog 0-15 units Q4H   Inpatient Diabetes Program Recommendations:     Noted hypoglycemia. Consider decreasing correction to Novolog 0-6 units Q4H.   Thank you, Kayla [name]. Kayla Raske, RN, MSN, CDE  Diabetes Coordinator Inpatient Glycemic Control Team Team Pager [phone] (8am-5pm) 10/27/2022 2:02 PM

## 2022-10-27 NOTE — Progress Notes (Signed)
ANTICOAGULATION CONSULT NOTE  Pharmacy Consult for heparin  Indication: atrial fibrillation  Allergies  Allergen Reactions   Dulaglutide Nausea Only, Other (See Comments) and Cough    TRULICITY made the patient sick to her stomach (only) several days after using it   Oxycodone Itching and Other (See Comments)    Pt still takes this med even thought it causes itching   Trelegy Ellipta [Fluticasone-Umeclidin-Vilant] Nausea And Vomiting, Other (See Comments) and Cough    Made the patient sick to her stomach (only) several days after using it     Patient Measurements: Height: 5\' 5"  (165.1 cm) Weight: 110.1 kg (242 lb 11.6 oz) IBW/kg (Calculated) : 57 HEPARIN DW (KG): 81.2   Vital Signs: Temp: 97.4 F (36.3 C) (06/25 1130) Temp Source: Oral (06/25 1130) Pulse Rate: 58 (06/25 1000)  Labs: Recent Labs    10/25/22 0730 10/25/22 0745 10/25/22 1016 10/25/22 1555 10/26/22 0207 10/26/22 0213 10/26/22 1610 10/26/22 1722 10/26/22 1830 10/27/22 0420 10/27/22 1309  HGB 8.4*   < >  --    < > 8.1* 8.8*  --  8.8*  --  7.6*  --   HCT 28.6*   < >  --    < > 27.3* 26.0*  --  26.0*  --  25.3*  --   PLT 478*  --   --   --  422*  --   --   --   --  391  --   APTT  --   --   --    < > 29  --   --   --  42* 58* 79*  HEPARINUNFRC  --   --   --   --   --   --   --   --  >1.10* >1.10*  --   CREATININE 5.06*  --   --    < > 2.73*  --  2.34*  --   --  2.70*  --   TROPONINIHS 33*  --  38*  --   --   --   --   --   --   --   --    < > = values in this interval not displayed.     Estimated Creatinine Clearance: 20.2 mL/min (A) (by C-G formula based on SCr of 2.7 mg/dL (H)).   Assessment: 82 yo female with AKI on CRRT. She is on apixaban PTA  (last dose 6/22 in the evening) for afib and plans are to start heparin in case of procedures.  - aPTT 77 sec (therapeutic) on infusion at 1400 units/hr.   Goal of Therapy:  Heparin level 0.3-0.7 units/ml aPTT 66-102 Monitor platelets by  anticoagulation protocol: Yes   Plan:  -Continue heparin at 1400 units/hr -Heparin level and aPTT in am  Harland German, PharmD Clinical Pharmacist **Pharmacist phone directory can now be found on [email or web address] ([email or web address]).  Listed under [facility].

## 2022-10-27 NOTE — Progress Notes (Signed)
Pharmacy Antibiotic Note  Kayla [name] is a 82 y.o. female admitted on 10/25/2022 with sepsis.  Pharmacy has been consulted for zosyn dosing. He was on CRRT and this has been stopped.  -SCr= 2.7 (trend up), UOP ~ 1100 ml -Day 3 antibiotics  Plan: -Zosyn 2.25gm IV q6 hours -Will follow renal function, cultures and clinical progress   Height: 5\' 5"  (165.1 cm) Weight: 110.1 kg (242 lb 11.6 oz) IBW/kg (Calculated) : 57  Temp (24hrs), Avg:98.1 F (36.7 C), Min:97.6 F (36.4 C), Max:98.7 F (37.1 C)  Recent Labs  Lab 10/25/22 0730 10/25/22 0748 10/25/22 1555 10/26/22 0207 10/26/22 1610 10/27/22 0420  WBC 18.3*  --   --  11.6*  --  13.6*  CREATININE 5.06*  --  4.45* 2.73* 2.34* 2.70*  LATICACIDVEN  --  0.6  --   --   --   --      Estimated Creatinine Clearance: 20.2 mL/min (A) (by C-G formula based on SCr of 2.7 mg/dL (H)).    Allergies  Allergen Reactions   Dulaglutide Nausea Only, Other (See Comments) and Cough    TRULICITY made the patient sick to her stomach (only) several days after using it   Oxycodone Itching and Other (See Comments)    Pt still takes this med even thought it causes itching   Trelegy Ellipta [Fluticasone-Umeclidin-Vilant] Nausea And Vomiting, Other (See Comments) and Cough    Made the patient sick to her stomach (only) several days after using it     Antimicrobials this admission: Cefepime 6/23 x1 Zosyn 6/23 >> Vanc 6/23> 6/24  Dose adjustments this admission:   Microbiology results: 6/23 BCx: ngtd 6/23 MRSA: neg   Thank you for allowing pharmacy to be a part of this patient's care.  Harland German, PharmD Clinical Pharmacist **Pharmacist phone directory can now be found on [email or web address] ([email or web address]).  Listed under [facility].

## 2022-10-27 NOTE — Evaluation (Signed)
Clinical/Bedside Swallow Evaluation Patient Details  Name: Kayla [name] MRN: [id number] Date of Birth: 30-Jun-1940  Today's Date: 10/27/2022 Time: SLP Start Time (ACUTE ONLY): 1317 SLP Stop Time (ACUTE ONLY): 1332 SLP Time Calculation (min) (ACUTE ONLY): 15 min  Past Medical History:  Past Medical History:  Diagnosis Date   Allergy    Anxiety    Arthritis    CAD (coronary artery disease)    Mild per remote cath in 2006, /    Nuclear, January, 2013, no significant abnormality,   Carotid artery disease (HCC)    Doppler, January, 2013, 0 39% RIC A., 40-59% LICA, stable   Cataract    CHF (congestive heart failure) (HCC)    Chronic kidney disease    Complication of anesthesia    wakes up "mean"   COPD 02/16/2007   Intolerant of Spiriva, tudorza Intolerant of Trelegy    COPD (chronic obstructive pulmonary disease) (HCC)    Coronary atherosclerosis 11/24/2008   Catheterization 2006, nonobstructive coronary disease   //   nuclear 2013, low risk     Diabetes mellitus, type 2 (HCC)    Diverticula of colon    DIVERTICULOSIS OF COLON 03/23/2007   Qualifier: Diagnosis of  By: Craige Cotta MD, Vineet     Dizziness    occasional   Dizzy spells 05/04/2011   DM (diabetes mellitus) (HCC) 03/23/2007   Qualifier: Diagnosis of  By: Craige Cotta MD, Vineet     Dyspnea    On exertion   Ejection fraction    Essential hypertension 02/16/2007   Qualifier: Diagnosis of  By: Earlene Plater CMA, Tammy     G E R D 03/23/2007   Qualifier: Diagnosis of  By: Craige Cotta MD, Vineet     Gall bladder disease 04/28/2016   Gastritis and gastroduodenitis    GERD (gastroesophageal reflux disease)    Goiter    hx of   Headache(784.0)    migraine   Hyperlipidemia    Hypertension    Hypothyroidism    HYPOTHYROIDISM, POSTSURGICAL 06/04/2008   Qualifier: Diagnosis of  By: Everardo All MD, Sean A    Left ventricular hypertrophy    Leg edema    occasional swelling   Leg swelling 12/09/2018   Myofascial pain syndrome, cervical 01/06/2018    Obesity    OBESITY 11/24/2008   Qualifier: Diagnosis of  By: Vikki Ports     OBSTRUCTIVE SLEEP APNEA 02/16/2007   Auto CPAP 09/03/13 to 10/02/13 >> used on 30 of 30 nights with average 6 hrs and 3 min.  Average AHI is 3.1 with median CPAP 5 cm H2O and 95 th percentile CPAP 6 cm H2O.     Occipital neuralgia of right side 01/06/2018   OSA (obstructive sleep apnea)    cpap setting of 12   Pain in joint of right hip 01/21/2018   Palpitations 01/21/2011   48 hour Holter, 2015, scattered PACs and PVCs.    Paroxysmal atrial fibrillation (HCC)    Pneumonia    PONV (postoperative nausea and vomiting)    severe after every surgery   Pulmonary hypertension, secondary    RHINITIS 07/21/2010        RUQ abdominal pain    S/P left TKA 07/27/2011   Sinus node dysfunction (HCC) 11/08/2015   Sleep apnea    Spinal stenosis of cervical region 11/07/2014   Urinary frequency 12/09/2018   UTI (urinary tract infection) 08/15/2019   per pt report   VENTRICULAR HYPERTROPHY, LEFT 11/24/2008   Qualifier: Diagnosis of  By:  Vikki Ports     Past Surgical History:  Past Surgical History:  Procedure Laterality Date   ABDOMINAL HYSTERECTOMY     with appendectomy and colon polypectomy   AMPUTATION TOE Left 12/15/2019   Procedure: AMPUTATION  LEFT GREAT TOE;  Surgeon: Park Liter, DPM;  Location: [facility];  Service: Podiatry;  Laterality: Left;   AMPUTATION TOE Left 01/22/2022   Procedure: AMPUTATION TOE 4th digit;  Surgeon: Candelaria Stagers, DPM;  Location: [facility];  Service: Podiatry;  Laterality: Left;   AMPUTATION TOE Left 09/04/2022   Procedure: LEFT BIG TOE AMPUTAION;  Surgeon: Vivi Barrack, DPM;  Location: [facility];  Service: Podiatry;  Laterality: Left;   ANTERIOR CERVICAL DECOMP/DISCECTOMY FUSION N/A 11/07/2014   Procedure: Anterior Cervical diskectomy and fusion Cervical four-five, Cervical five-six, Cervical six-seven;  Surgeon: Donalee Citrin, MD;  Location: [facility];  Service: Neurosurgery;   Laterality: N/A;   APPENDECTOMY     ARTERY BIOPSY Right 02/16/2017   Procedure: BIOPSY TEMPORAL ARTERY;  Surgeon: Renford Dills, MD;  Location: [facility];  Service: Vascular;  Laterality: Right;   ARTERY BIOPSY Left 04/16/2020   Procedure: BIOPSY TEMPORAL ARTERY;  Surgeon: Renford Dills, MD;  Location: [facility];  Service: Vascular;  Laterality: Left;  LEFT TEMPLE   BACK SURGERY     CARDIAC CATHETERIZATION     CATARACT EXTRACTION W/ INTRAOCULAR LENS  IMPLANT, BILATERAL Bilateral    CHOLECYSTECTOMY N/A 04/29/2016   Procedure: LAPAROSCOPIC CHOLECYSTECTOMY WITH INTRAOPERATIVE CHOLANGIOGRAM;  Surgeon: Ovidio Kin, MD;  Location: [facility];  Service: General;  Laterality: N/A;   EP IMPLANTABLE DEVICE N/A 11/08/2015   MRI COMPATABLE -- Procedure: Pacemaker Implant;  Surgeon: Duke Salvia, MD;  Location: [facility];  Service: Cardiovascular;  Laterality: N/A;   INCISION AND DRAINAGE ABSCESS Right 05/30/2021   Procedure: IRRIGATION AND DEBRIDEMENT OF PREPATELLA BURSA;  Surgeon: Durene Romans, MD;  Location: [facility];  Service: Orthopedics;  Laterality: Right;  60 wants standard knee draping and pulse lavage   INCISION AND DRAINAGE ABSCESS Right 07/22/2021   Procedure: INCISION AND DRAINAGE ABSCESS RIGHT KNEE;  Surgeon: Durene Romans, MD;  Location: [facility];  Service: Orthopedics;  Laterality: Right;   INSERT / REPLACE / REMOVE PACEMAKER  2017   Dr. [facility])   IR FLUORO GUIDE CV LINE RIGHT  07/25/2021   IR REMOVAL TUN CV CATH W/O FL  09/04/2021   IR US GUIDE VASC ACCESS RIGHT  07/25/2021   JOINT REPLACEMENT     LEAD EXTRACTION N/A 09/08/2022   Procedure: LEAD EXTRACTION;  Surgeon: Marinus Maw, MD;  Location: [facility];  Service: Cardiovascular;  Laterality: N/A;   LEAD EXTRACTION N/A 09/11/2022   Procedure: LEAD EXTRACTION;  Surgeon: Marinus Maw, MD;  Location: [facility];  Service: Cardiovascular;  Laterality: N/A;   LUMBAR DISC SURGERY  1990's   x 2   TEE  WITHOUT CARDIOVERSION N/A 09/07/2022   Procedure: TRANSESOPHAGEAL ECHOCARDIOGRAM;  Surgeon: Christell Constant, MD;  Location: [facility];  Service: Cardiovascular;  Laterality: N/A;   THYROIDECTOMY  2011   TOTAL KNEE ARTHROPLASTY  07/27/2011   Procedure: TOTAL KNEE ARTHROPLASTY;  Surgeon: Shelda Pal, MD;  Location: [facility];  Service: Orthopedics;  Laterality: Left;   UPPER ESOPHAGEAL ENDOSCOPIC ULTRASOUND (EUS) N/A 05/14/2016   Procedure: UPPER ESOPHAGEAL ENDOSCOPIC ULTRASOUND (EUS);  Surgeon: Rachael Fee, MD;  Location: [facility];  Service: Endoscopy;  Laterality: N/A;  radial  only-will be done mod if needed   HPI:  82 y.o. female admitted with respiratory distress. History of obesity, DM II, HTN, HLD, CAD, CHF, PAF on Eliquis, SSS s/p PPM, CKD, Anemia, COPD, GERD, hypothyroidism, chronic venous insufficiency, s/p left first ray amputation (5/3) with active osteomyelitis. CXR Improved bibasilar opacities    Assessment / Plan / Recommendation  Clinical Impression  Pt presents with normal oropharyngeal swallow function in the setting of respiratory distress. Pt was not mechanically ventilated and has intermittent Bipap. Upper and lower dentures donned and noted possible lingual candidias. Her cough is strong and oromotor abilities adequate. Work of breathing increased once and pt needed a rest break and cues to not vocalize immediately after swallowing. There was immediate cough x 1 only after initial sip of water via straw and subsequent sips consumed without s/s aspiration. She masticated graham cracker without difficulty or increased work of breathing. Recommend regular texture, thin liquids, pills with thin (if difficulty can give whole in applesauce), small sips and rest breaks if needed. No further ST needed. SLP Visit Diagnosis: Dysphagia, unspecified (R13.10)    Aspiration Risk  Mild aspiration risk    Diet Recommendation Regular;Thin liquid   Liquid Administration  via: Cup;Straw Medication Administration: Whole meds with liquid Supervision: Patient able to self feed Compensations: Other (Comment) (rest breaks if needed) Postural Changes: Seated upright at 90 degrees    Other  Recommendations Oral Care Recommendations: Oral care BID    Recommendations for follow up therapy are one component of a multi-disciplinary discharge planning process, led by the attending physician.  Recommendations may be updated based on patient status, additional functional criteria and insurance authorization.  Follow up Recommendations No SLP follow up      Assistance Recommended at Discharge    Functional Status Assessment Patient has not had a recent decline in their functional status  Frequency and Duration            Prognosis        Swallow Study   General Date of Onset: 10/25/22 HPI: 82 y.o. female admitted with respiratory distress. History of obesity, DM II, HTN, HLD, CAD, CHF, PAF on Eliquis, SSS s/p PPM, CKD, Anemia, COPD, GERD, hypothyroidism, chronic venous insufficiency, s/p left first ray amputation (5/3) with active osteomyelitis. CXR Improved bibasilar opacities Type of Study: Bedside Swallow Evaluation Previous Swallow Assessment:  (none) Diet Prior to this Study: NPO Temperature Spikes Noted: No Respiratory Status: Nasal cannula History of Recent Intubation: No Behavior/Cognition: Alert;Cooperative;Pleasant mood Oral Cavity Assessment: Other (comment) (suspect lingual candidias) Oral Care Completed by SLP: No Oral Cavity - Dentition: Dentures, top;Dentures, bottom Vision: Functional for self-feeding Self-Feeding Abilities: Able to feed self Patient Positioning: Upright in bed Baseline Vocal Quality: Normal Volitional Cough: Strong Volitional Swallow: Able to elicit    Oral/Motor/Sensory Function Overall Oral Motor/Sensory Function: Within functional limits   Ice Chips Ice chips: Not tested   Thin Liquid Thin Liquid:  Impaired Presentation: Cup;Straw Oral Phase Impairments:  (none) Oral Phase Functional Implications:  (none) Pharyngeal  Phase Impairments: Cough - Immediate    Nectar Thick Nectar Thick Liquid: Not tested   Honey Thick Honey Thick Liquid: Not tested   Puree Puree: Within functional limits   Solid     Solid: Within functional limits      Royce Macadamia 10/27/2022,2:01 PM

## 2022-10-28 DIAGNOSIS — E875 Hyperkalemia: Secondary | ICD-10-CM | POA: Diagnosis not present

## 2022-10-28 DIAGNOSIS — J9601 Acute respiratory failure with hypoxia: Secondary | ICD-10-CM | POA: Diagnosis not present

## 2022-10-28 DIAGNOSIS — J9602 Acute respiratory failure with hypercapnia: Secondary | ICD-10-CM | POA: Diagnosis not present

## 2022-10-28 DIAGNOSIS — R001 Bradycardia, unspecified: Secondary | ICD-10-CM | POA: Diagnosis not present

## 2022-10-28 DIAGNOSIS — N179 Acute kidney failure, unspecified: Secondary | ICD-10-CM | POA: Diagnosis not present

## 2022-10-28 LAB — RENAL FUNCTION PANEL
Albumin: 2.1 g/dL — ABNORMAL LOW (ref 3.5–5.0)
Anion gap: 12 (ref 5–15)
BUN: 60 mg/dL — ABNORMAL HIGH (ref 8–23)
CO2: 26 mmol/L (ref 22–32)
Calcium: 8.3 mg/dL — ABNORMAL LOW (ref 8.9–10.3)
Chloride: 99 mmol/L (ref 98–111)
Creatinine, Ser: 3.04 mg/dL — ABNORMAL HIGH (ref 0.44–1.00)
GFR, Estimated: 15 mL/min — ABNORMAL LOW (ref >60)
Glucose, Bld: 100 mg/dL — ABNORMAL HIGH (ref 70–99)
Phosphorus: 5.2 mg/dL — ABNORMAL HIGH (ref 2.5–4.6)
Potassium: 4.1 mmol/L (ref 3.5–5.1)
Sodium: 137 mmol/L (ref 135–145)

## 2022-10-28 LAB — CBC
HCT: 24.4 % — ABNORMAL LOW (ref 36.0–46.0)
Hemoglobin: 7.3 g/dL — ABNORMAL LOW (ref 12.0–15.0)
MCH: 27.9 pg (ref 26.0–34.0)
MCHC: 29.9 g/dL — ABNORMAL LOW (ref 30.0–36.0)
MCV: 93.1 fL (ref 80.0–100.0)
Platelets: 370 10*3/uL (ref 150–400)
RBC: 2.62 MIL/uL — ABNORMAL LOW (ref 3.87–5.11)
RDW: 17.2 % — ABNORMAL HIGH (ref 11.5–15.5)
WBC: 12.3 10*3/uL — ABNORMAL HIGH (ref 4.0–10.5)
nRBC: 0 % (ref 0.0–0.2)

## 2022-10-28 LAB — GLUCOSE, CAPILLARY
Glucose-Capillary: 101 mg/dL — ABNORMAL HIGH (ref 70–99)
Glucose-Capillary: 206 mg/dL — ABNORMAL HIGH (ref 70–99)
Glucose-Capillary: 184 mg/dL — ABNORMAL HIGH (ref 70–99)
Glucose-Capillary: 182 mg/dL — ABNORMAL HIGH (ref 70–99)
Glucose-Capillary: 184 mg/dL — ABNORMAL HIGH (ref 70–99)
Glucose-Capillary: 121 mg/dL — ABNORMAL HIGH (ref 70–99)

## 2022-10-28 LAB — APTT: aPTT: 82 seconds — ABNORMAL HIGH (ref 24–36)

## 2022-10-28 LAB — HEPARIN LEVEL (UNFRACTIONATED): Heparin Unfractionated: >1.1 IU/mL — ABNORMAL HIGH (ref 0.30–0.70)

## 2022-10-28 LAB — MAGNESIUM: Magnesium: 2.1 mg/dL (ref 1.7–2.4)

## 2022-10-28 LAB — CULTURE, BLOOD (ROUTINE X 2): Culture: NO GROWTH

## 2022-10-28 MED ORDER — GUAIFENESIN-DM 100-10 MG/5ML PO SYRP
5.0000 mL | ORAL_SOLUTION | ORAL | Status: DC | PRN
  Administered 2022-10-28 – 2022-11-05 (×8): 5 mL via ORAL
  Filled 2022-10-28 (×8): qty 5

## 2022-10-28 MED ORDER — FUROSEMIDE 10 MG/ML IJ SOLN
80.0000 mg | Freq: Two times a day (BID) | INTRAMUSCULAR | Status: DC
  Administered 2022-10-28 – 2022-10-29 (×4): 80 mg via INTRAVENOUS
  Filled 2022-10-28 (×4): qty 8

## 2022-10-28 MED ORDER — HYDROCODONE-ACETAMINOPHEN 5-325 MG PO TABS
1.0000 | ORAL_TABLET | Freq: Once | ORAL | Status: AC
  Administered 2022-10-28: 1 via ORAL
  Filled 2022-10-28: qty 1

## 2022-10-28 MED ORDER — AMLODIPINE BESYLATE 10 MG PO TABS
10.0000 mg | ORAL_TABLET | Freq: Every day | ORAL | Status: DC
  Administered 2022-10-28 – 2022-11-14 (×17): 10 mg via ORAL
  Filled 2022-10-28 (×18): qty 1

## 2022-10-28 MED ORDER — ISOSORBIDE MONONITRATE ER 30 MG PO TB24
15.0000 mg | ORAL_TABLET | Freq: Every day | ORAL | Status: DC
  Administered 2022-10-28: 15 mg via ORAL
  Filled 2022-10-28: qty 1

## 2022-10-28 MED ORDER — HYDROCODONE-ACETAMINOPHEN 5-325 MG PO TABS
1.0000 | ORAL_TABLET | Freq: Four times a day (QID) | ORAL | Status: DC | PRN
  Administered 2022-10-28 – 2022-11-01 (×9): 2 via ORAL
  Filled 2022-10-28 (×9): qty 2

## 2022-10-28 MED ORDER — ATORVASTATIN CALCIUM 40 MG PO TABS
40.0000 mg | ORAL_TABLET | Freq: Every day | ORAL | Status: DC
  Administered 2022-10-28 – 2022-11-14 (×17): 40 mg via ORAL
  Filled 2022-10-28 (×18): qty 1

## 2022-10-28 MED ORDER — LEVOTHYROXINE SODIUM 100 MCG PO TABS
200.0000 ug | ORAL_TABLET | Freq: Every day | ORAL | Status: DC
  Administered 2022-10-28 – 2022-11-14 (×18): 200 ug via ORAL
  Filled 2022-10-28 (×19): qty 2

## 2022-10-28 NOTE — Progress Notes (Signed)
NAME:  Kayla [name], MRN:  [id number], DOB:  March 30, 1941, LOS: 3 ADMISSION DATE:  10/25/2022, CONSULTATION DATE: 10/25/2022 REFERRING MD: Dr. Katrinka Blazing, CHIEF COMPLAINT: Altered mental status and shortness of breath  History of Present Illness:  82 year old female with hypertension, hyperlipidemia, CKD stage IV coronary artery disease, paroxysmal A-fib on Eliquis was brought into the emergency department accompanied by her husband because of respiratory distress and altered mental status.  As per patient's husband she is having increasing shortness of breath for last few days but this morning it got worse, so he called EMS, upon arrival patient was found to be in respiratory distress, initially BVM ventilation was performed, she was noted to be bradycardic with heart rate in 30s, started on transcutaneous pacing, initially was placed on nonrebreather facemask.  In ED patient was switched from facemask to BiPAP, she was noted to be severely bradycardic with heart rate in 30s with stable systolic blood pressure around 140.  She was confused, minimally responsive, ABG was done which showed pH 7.1 with high pCO2 and low bicarbonate.  Also noted to have AKI with baseline serum creatinine around 2.5 and admission creatinine of 5.4 with serum potassium of 6.6.  She was given calcium gluconate, sodium bicarbonate and Lasix.  PCCM was consulted for help evaluation medical management  Pertinent  Medical History   Past Medical History:  Diagnosis Date   Allergy    Anxiety    Arthritis    CAD (coronary artery disease)    Mild per remote cath in 2006, /    Nuclear, January, 2013, no significant abnormality,   Carotid artery disease (HCC)    Doppler, January, 2013, 0 39% RIC A., 40-59% LICA, stable   Cataract    CHF (congestive heart failure) (HCC)    Chronic kidney disease    Complication of anesthesia    wakes up "mean"   COPD 02/16/2007   Intolerant of Spiriva, tudorza Intolerant of Trelegy    COPD  (chronic obstructive pulmonary disease) (HCC)    Coronary atherosclerosis 11/24/2008   Catheterization 2006, nonobstructive coronary disease   //   nuclear 2013, low risk     Diabetes mellitus, type 2 (HCC)    Diverticula of colon    DIVERTICULOSIS OF COLON 03/23/2007   Qualifier: Diagnosis of  By: Craige Cotta MD, Vineet     Dizziness    occasional   Dizzy spells 05/04/2011   DM (diabetes mellitus) (HCC) 03/23/2007   Qualifier: Diagnosis of  By: Craige Cotta MD, Vineet     Dyspnea    On exertion   Ejection fraction    Essential hypertension 02/16/2007   Qualifier: Diagnosis of  By: Earlene Plater CMA, Tammy     G E R D 03/23/2007   Qualifier: Diagnosis of  By: Craige Cotta MD, Vineet     Gall bladder disease 04/28/2016   Gastritis and gastroduodenitis    GERD (gastroesophageal reflux disease)    Goiter    hx of   Headache(784.0)    migraine   Hyperlipidemia    Hypertension    Hypothyroidism    HYPOTHYROIDISM, POSTSURGICAL 06/04/2008   Qualifier: Diagnosis of  By: Everardo All MD, Sean A    Left ventricular hypertrophy    Leg edema    occasional swelling   Leg swelling 12/09/2018   Myofascial pain syndrome, cervical 01/06/2018   Obesity    OBESITY 11/24/2008   Qualifier: Diagnosis of  By: Vikki Ports     OBSTRUCTIVE SLEEP APNEA 02/16/2007   Auto CPAP  09/03/13 to 10/02/13 >> used on 30 of 30 nights with average 6 hrs and 3 min.  Average AHI is 3.1 with median CPAP 5 cm H2O and 95 th percentile CPAP 6 cm H2O.     Occipital neuralgia of right side 01/06/2018   OSA (obstructive sleep apnea)    cpap setting of 12   Pain in joint of right hip 01/21/2018   Palpitations 01/21/2011   48 hour Holter, 2015, scattered PACs and PVCs.    Paroxysmal atrial fibrillation (HCC)    Pneumonia    PONV (postoperative nausea and vomiting)    severe after every surgery   Pulmonary hypertension, secondary    RHINITIS 07/21/2010        RUQ abdominal pain    S/P left TKA 07/27/2011   Sinus node dysfunction (HCC) 11/08/2015    Sleep apnea    Spinal stenosis of cervical region 11/07/2014   Urinary frequency 12/09/2018   UTI (urinary tract infection) 08/15/2019   per pt report   VENTRICULAR HYPERTROPHY, LEFT 11/24/2008   Qualifier: Diagnosis of  By: Vikki Ports       Significant Hospital Events: Including procedures, antibiotic start and stop dates in addition to other pertinent events     Interim History / Subjective:  Yesterday evening patient again started with shortness of breath, she desatted to high 80s, required BiPAP again Her blood pressure was also high in 160s to 170s This morning she came off of BiPAP, back to her baseline oxygen of 3 to 4 L, stated feeling better Continue to make good amount of urine  Remain off Cardene  Objective   Blood pressure (!) 172/48, pulse (!) 55, temperature 97.7 F (36.5 C), temperature source Oral, resp. rate 17, height 5\' 5"  (1.651 m), weight 103.9 kg, SpO2 96 %.    Vent Mode: BIPAP;PCV FiO2 (%):  [40 %] 40 % Set Rate:  [12 bmp] 12 bmp PEEP:  [5 cmH20] 5 cmH20   Intake/Output Summary (Last 24 hours) at 10/28/2022 0759 Last data filed at 10/28/2022 0700 Gross per 24 hour  Intake 745.34 ml  Output 2640 ml  Net -1894.66 ml   Filed Weights   10/26/22 0600 10/27/22 0500 10/28/22 0600  Weight: 108.7 kg 110.1 kg 103.9 kg    Examination: Physical exam: General: Acute on chronically ill-appearing morbidly obese female, lying on the bed HEENT: Day/AT, eyes anicteric.  moist mucus membranes.  On nasal cannula oxygen Neuro: Alert, awake following commands, moving all 4 extremities Chest: Bilateral coarse crackles at bases, no wheezes or rhonchi Heart: Bradycardic regular rate and rhythm, systolic murmur Abdomen: Soft, nontender, nondistended, bowel sounds present Skin: No rash  Labs and images were reviewed  Resolved Hospital Problem list   Mixed metabolic and respiratory acidosis Hyperkalemia/hyponatremia  Assessment & Plan:  Acute on chronic  hypoxic/hypercapnic respiratory failure Acute on chronic HFpEF AKI on CKD stage IV Acute metabolic/uremic encephalopathy, improving Sepsis due to community-acquired pneumonia Bradycardia/junctional rhythm, history of sick sinus syndrome Paroxysmal A-fib Anemia of chronic disease  She is off BiPAP now, last night she required BiPAP Currently on 3.5 L nasal cannula oxygen which is her baseline Hypercapnia has cleared, mental status is improved Trend ABGs CRRT has been off for 2 days Serum creatinine trended up from 2.8 now to 3.0 Per discussion with nephrology will decrease Lasix to 80 mg twice daily  Continue Mucinex, stated coughing up sputum now Continue Zosyn for now to complete 5 days therapy Cultures have been Has been off  dopamine, heart rate remained in the 40s but she is asymptomatic Appreciate cardiology follow-up, recommend no need for pacemaker placement Remain in sinus rhythm Patient is on Eliquis at home for stroke prophylaxis, currently on hold Continue heparin infusion for now Closely monitor electrolytes Monitor intake and output Avoid nephrotoxic agents Monitor H&H  Best Practice (right click and "Reselect all SmartList Selections" daily)   Diet/type: Regular consistency DVT prophylaxis: Heparin gtt GI prophylaxis: N/A Lines: Discontinue today Foley:  Yes, and it is still needed Code Status:  DNR Last date of multidisciplinary goals of care discussion [6/26: Patient and her husband updated at bedside Labs   CBC: Recent Labs  Lab 10/25/22 0730 10/25/22 0745 10/26/22 0207 10/26/22 0213 10/26/22 1722 10/27/22 0420 10/28/22 0500  WBC 18.3*  --  11.6*  --   --  13.6* 12.3*  NEUTROABS 15.2*  --   --   --   --   --   --   HGB 8.4*   < > 8.1* 8.8* 8.8* 7.6* 7.3*  HCT 28.6*   < > 27.3* 26.0* 26.0* 25.3* 24.4*  MCV 97.3  --  94.1  --   --  93.7 93.1  PLT 478*  --  422*  --   --  391 370   < > = values in this interval not displayed.    Basic Metabolic  Panel: Recent Labs  Lab 10/26/22 0207 10/26/22 0213 10/26/22 1610 10/26/22 1722 10/27/22 0420 10/27/22 1526 10/28/22 0500  NA 135   < > 133* 134* 135 137 137  K 4.5   < > 4.4 4.9 4.3 4.2 4.1  CL 103  --  100  --  100 102 99  CO2 22  --  26  --  25 25 26   GLUCOSE 73  --  167*  --  86 208* 100*  BUN 63*  --  47*  --  55* 57* 60*  CREATININE 2.73*  --  2.34*  --  2.70* 2.81* 3.04*  CALCIUM 7.6*  --  7.8*  --  8.0* 7.9* 8.3*  MG 2.1  --   --   --  2.1  --  2.1  PHOS 3.9  --  3.7  --  4.5 4.5 5.2*   < > = values in this interval not displayed.   GFR: Estimated Creatinine Clearance: 17.4 mL/min (A) (by C-G formula based on SCr of 3.04 mg/dL (H)). Recent Labs  Lab 10/25/22 0730 10/25/22 0748 10/25/22 1016 10/26/22 0207 10/27/22 0420 10/28/22 0500  PROCALCITON  --   --  0.16  --   --   --   WBC 18.3*  --   --  11.6* 13.6* 12.3*  LATICACIDVEN  --  0.6  --   --   --   --     Liver Function Tests: Recent Labs  Lab 10/26/22 0207 10/26/22 1610 10/27/22 0420 10/27/22 1526 10/28/22 0500  AST 14*  --   --   --   --   ALT 18  --   --   --   --   ALKPHOS 49  --   --   --   --   BILITOT 0.5  --   --   --   --   PROT 6.0*  --   --   --   --   ALBUMIN 2.2* 2.1* 2.0* 2.1* 2.1*   No results for input(s): "LIPASE", "AMYLASE" in the last 168 hours. No results for input(s): "  AMMONIA" in the last 168 hours.  ABG    Component Value Date/Time   PHART 7.394 10/26/2022 1722   PCO2ART 42.6 10/26/2022 1722   PO2ART 137 (H) 10/26/2022 1722   HCO3 26.0 10/26/2022 1722   TCO2 27 10/26/2022 1722   ACIDBASEDEF 5.0 (H) 10/25/2022 1601   O2SAT 99 10/26/2022 1722     Coagulation Profile: No results for input(s): "INR", "PROTIME" in the last 168 hours.  Cardiac Enzymes: No results for input(s): "CKTOTAL", "CKMB", "CKMBINDEX", "TROPONINI" in the last 168 hours.  HbA1C: Hgb A1c MFr Bld  Date/Time Value Ref Range Status  09/04/2022 01:29 AM 6.8 (H) 4.8 - 5.6 % Final    Comment:     (NOTE) Pre diabetes:          5.7%-6.4%  Diabetes:              >6.4%  Glycemic control for   <7.0% adults with diabetes   05/30/2021 10:23 AM 8.4 (H) 4.8 - 5.6 % Final    Comment:    (NOTE) Pre diabetes:          5.7%-6.4%  Diabetes:              >6.4%  Glycemic control for   <7.0% adults with diabetes     CBG: Recent Labs  Lab 10/27/22 1127 10/27/22 1533 10/27/22 2001 10/27/22 2332 10/28/22 0335  GLUCAP 73  73 187* 109* 116* 101*    Critical care time:     The patient is critically ill due to acute renal failure/acute hypoxic/hypercapnic respiratory failure, Sepsis due to pneumonia.  Critical care was necessary to treat or prevent imminent or life-threatening deterioration.  Critical care was time spent personally by me on the following activities: development of treatment plan with patient and/or surrogate as well as nursing, discussions with consultants, evaluation of patient's response to treatment, examination of patient, obtaining history from patient or surrogate, ordering and performing treatments and interventions, ordering and review of laboratory studies, ordering and review of radiographic studies, pulse oximetry, re-evaluation of patient's condition and participation in multidisciplinary rounds.   During this encounter critical care time was devoted to patient care services described in this note for 33 minutes.     Cheri Fowler, MD [facility] See Amion for pager If no response to pager, please call [phone] until 7pm After 7pm, Please call E-link [phone]

## 2022-10-28 NOTE — Progress Notes (Signed)
Pt is on and off BIPAP. Currently off and wearing nasal cannula no distress noted.

## 2022-10-28 NOTE — Progress Notes (Signed)
[facility] Progress Note    Assessment/ Plan:   Acute kidney injury on CKD stage IV likely due to CHF exacerbation/fluid overload/bradycardia leading to reduced renal perfusion.  B/L Cr around 2, followed by me as an outpatient. She had hyperkalemia, anasarca and did not respond with IV diuretics.  Also had features of uremic encephalopathy including tremor, AMS. -temp HD catheter placed on 6/23, started CRRT 6/23. CRRT off on 6/24 given access issues/alarms. -Cr 3 today, will decrease lasix dose to 80mg  IV BID as she does have some hypervolemia -given that she has had access issues and not requiring central access at this time, can remove HD catheter. If she is going to need RRT then will need a new catheter anyway -no indications for HD/CRRT at this time-volume status stable and not exhibiting any uremic signs/symptoms. Of note, discussed with patient and husband and she would want to pursue dialysis in the long term. We did discuss this in the office and she is interested in home therapies down the road if/when indicated. -daily labs, strict I/O   Hyperkalemia: Received medical treatment in the ER.  Improved with CRRT (currently off). K WNL this AM, on lasix   Bradycardia: Seen by cardiologist.  Off dopamine gtt, HR stable   Acute on chronic respiratory failure/respiratory distress with hypoxia: lasix as above. CXR 6/25 revealed improvement in opacities. On 3L [place] at baseline   Sepsis due to possible pneumonia: On antibiotics per primary team.  Hyponatremia, mild: likely secondary to AKI and hypervolemia. WNL now  Anemia: transfuse for Hgb <7. ESA start 6/26  Discussed with primary service.  Subjective:   Patient seen and examined bedside in ICU. Husband at bedside.  Patient reports that she has some SOB overnight, feels better. Complains of left rib and back pain, received pain meds. Otherwise denies any chest pain, worsening SOB currently, n/v, dysgeusia, loss of  appetite (currently eating breakfast). Off dopamine, HR stable.   Objective:   BP (!) 172/48   Pulse (!) 55   Temp 97.6 F (36.4 C) (Oral)   Resp 17   Ht 5\' 5"  (1.651 m)   Wt 103.9 kg   SpO2 96%   BMI 38.12 kg/m   Intake/Output Summary (Last 24 hours) at 10/28/2022 1610 Last data filed at 10/28/2022 0600 Gross per 24 hour  Intake 681.14 ml  Output 2640 ml  Net -1958.86 ml   Weight change: -6.2 kg  Physical Exam: Gen: NAD, sitting up in bed eating breakfast CVS: RRR Resp: decreased breath sounds bibasilar, on [place] Abd: soft, nt/nd Ext: trace pitting edema b/l Les Neuro: awake, alert, no asterixis Dialysis access: RIJ temp line c/d/i  Imaging: DG CHEST PORT 1 VIEW  Result Date: 10/27/2022 CLINICAL DATA:  Acute hypercapnic respiratory failure. EXAM: PORTABLE CHEST 1 VIEW COMPARISON:  October 25, 2022. FINDINGS: Stable cardiomegaly. Right internal jugular catheter is noted with tip in expected position of the SVC. Slightly decreased bibasilar opacities are noted suggesting improving atelectasis or edema. Small pleural effusions may be present. Bony thorax is unremarkable. IMPRESSION: Improved bibasilar opacities as described above. Electronically Signed   By: Lupita Raider M.D.   On: 10/27/2022 11:30    Labs: BMET Recent Labs  Lab 10/25/22 0730 10/25/22 0745 10/25/22 1555 10/25/22 1601 10/26/22 0207 10/26/22 0213 10/26/22 1610 10/26/22 1722 10/27/22 0420 10/27/22 1526 10/28/22 0500  NA 134*   < > 134*   < > 135 137 133* 134* 135 137 137  K 6.6*   < >  6.6*   < > 4.5 4.8 4.4 4.9 4.3 4.2 4.1  CL 99  --  99  --  103  --  100  --  100 102 99  CO2 19*  --  21*  --  22  --  26  --  25 25 26   GLUCOSE 207*  --  164*  --  73  --  167*  --  86 208* 100*  BUN 120*  --  106*  --  63*  --  47*  --  55* 57* 60*  CREATININE 5.06*  --  4.45*  --  2.73*  --  2.34*  --  2.70* 2.81* 3.04*  CALCIUM 8.9  --  8.5*  --  7.6*  --  7.8*  --  8.0* 7.9* 8.3*  PHOS  --   --  5.8*  --  3.9  --   3.7  --  4.5 4.5 5.2*   < > = values in this interval not displayed.   CBC Recent Labs  Lab 10/25/22 0730 10/25/22 0745 10/26/22 0207 10/26/22 0213 10/26/22 1722 10/27/22 0420 10/28/22 0500  WBC 18.3*  --  11.6*  --   --  13.6* 12.3*  NEUTROABS 15.2*  --   --   --   --   --   --   HGB 8.4*   < > 8.1* 8.8* 8.8* 7.6* 7.3*  HCT 28.6*   < > 27.3* 26.0* 26.0* 25.3* 24.4*  MCV 97.3  --  94.1  --   --  93.7 93.1  PLT 478*  --  422*  --   --  391 370   < > = values in this interval not displayed.    Medications:     bisacodyl  10 mg Rectal Daily   Or   bisacodyl  10 mg Oral Daily   Chlorhexidine Gluconate Cloth  6 each Topical Daily   cloNIDine  0.2 mg Transdermal Weekly   darbepoetin (ARANESP) injection - NON-DIALYSIS  60 mcg Subcutaneous Q Wed-1800   furosemide  80 mg Intravenous BID   guaiFENesin  600 mg Oral BID   insulin aspart  2-6 Units Subcutaneous Q4H   multivitamin  1 tablet Per Tube QHS   mouth rinse  15 mL Mouth Rinse 4 times per day   sodium chloride flush  3 mL Intravenous Q12H      Anthony Sar, MD [facility] 10/28/2022, 7:38 AM

## 2022-10-28 NOTE — Progress Notes (Signed)
DAILY PROGRESS NOTE   Patient Name: Kayla [name] Date of Encounter: 10/28/2022 Cardiologist: Donato Schultz, MD  Chief Complaint   No complaints  Patient Profile   Kayla [name] is a 82 y.o. female with a hx of obesity, type 2 diabetes, hypertension, hyperlipidemia, CAD, congestive heart failure, paroxysmal atrial fibrillation (on Eliquis), sick sinus syndrome status post pacemaker, CKD, anemia, COPD, GERD, hypothyroidism, chronic venous insufficiency, status post left first ray amputation (09/04/2022) with active osteomyelitis who is being seen 10/25/2022 for the evaluation of bradycardia at the request of Dr. Rodena Medin.   Subjective   No issues overnight- creatinine now 3 (up from 2.8), making urine on lasix. Anemic - Hb 7.3, on heparin. Off dopamine. HR stable with sinus brady in the 50's - some PAC's and PVC's overnight, but generally no issues.  Objective   Vitals:   10/28/22 0600 10/28/22 0700 10/28/22 0756 10/28/22 0800  BP:      Pulse: (!) 45 (!) 55  (!) 52  Resp: 17 17  14   Temp:   97.7 F (36.5 C)   TempSrc:   Oral   SpO2: 98% 96%  95%  Weight: 103.9 kg     Height:        Intake/Output Summary (Last 24 hours) at 10/28/2022 1478 Last data filed at 10/28/2022 0700 Gross per 24 hour  Intake 693.14 ml  Output 2640 ml  Net -1946.86 ml   Filed Weights   10/26/22 0600 10/27/22 0500 10/28/22 0600  Weight: 108.7 kg 110.1 kg 103.9 kg    Physical Exam   General appearance: alert, no distress, morbidly obese, and pale Neck: JVD - 3 cm above sternal notch, no carotid bruit, and thyroid not enlarged, symmetric, no tenderness/mass/nodules Lungs: diminished breath sounds bibasilar and bilaterally Heart: regular rate and rhythm Abdomen: soft, non-tender; bowel sounds normal; no masses,  no organomegaly Extremities: edema 2+ bilateral LE Pulses: 2+ and symmetric Skin: Skin color, texture, turgor normal. No rashes or lesions Neurologic: Alert and oriented X 3, normal  strength and tone. Normal symmetric reflexes. Normal coordination and gait Psych: Pleasant  Inpatient Medications    Scheduled Meds:  amLODipine  10 mg Oral Daily   atorvastatin  40 mg Oral Daily   bisacodyl  10 mg Rectal Daily   Or   bisacodyl  10 mg Oral Daily   Chlorhexidine Gluconate Cloth  6 each Topical Daily   cloNIDine  0.2 mg Transdermal Weekly   darbepoetin (ARANESP) injection - NON-DIALYSIS  60 mcg Subcutaneous Q Wed-1800   furosemide  80 mg Intravenous BID   guaiFENesin  600 mg Oral BID   insulin aspart  2-6 Units Subcutaneous Q4H   isosorbide mononitrate  15 mg Oral Daily   levothyroxine  200 mcg Oral Q0600   multivitamin  1 tablet Per Tube QHS   mouth rinse  15 mL Mouth Rinse 4 times per day   sodium chloride flush  3 mL Intravenous Q12H    Continuous Infusions:  sodium chloride Stopped (10/26/22 1934)   heparin 1,400 Units/hr (10/28/22 0700)   piperacillin-tazobactam (ZOSYN)  IV Stopped (10/28/22 2956)   prismasol BGK 2/2.5 dialysis solution 1,500 mL/hr at 10/26/22 1224   prismasol BGK 2/2.5 replacement solution 400 mL/hr at 10/26/22 0154   prismasol BGK 2/2.5 replacement solution 400 mL/hr at 10/26/22 0156    PRN Meds: sodium chloride, acetaminophen **OR** acetaminophen, albuterol, heparin, hydrALAZINE, HYDROcodone-acetaminophen, mouth rinse   Labs   Results for orders placed or performed during  the hospital encounter of 10/25/22 (from the past 48 hour(s))  Glucose, capillary     Status: None   Collection Time: 10/26/22 12:07 PM  Result Value Ref Range   Glucose-Capillary 97 70 - 99 mg/dL    Comment: Glucose reference range applies only to samples taken after fasting for at least 8 hours.  Glucose, capillary     Status: Abnormal   Collection Time: 10/26/22  4:09 PM  Result Value Ref Range   Glucose-Capillary 162 (H) 70 - 99 mg/dL    Comment: Glucose reference range applies only to samples taken after fasting for at least 8 hours.  Renal function panel  (daily at 1600)     Status: Abnormal   Collection Time: 10/26/22  4:10 PM  Result Value Ref Range   Sodium 133 (L) 135 - 145 mmol/L   Potassium 4.4 3.5 - 5.1 mmol/L   Chloride 100 98 - 111 mmol/L   CO2 26 22 - 32 mmol/L   Glucose, Bld 167 (H) 70 - 99 mg/dL    Comment: Glucose reference range applies only to samples taken after fasting for at least 8 hours.   BUN 47 (H) 8 - 23 mg/dL   Creatinine, Ser 4.09 (H) 0.44 - 1.00 mg/dL   Calcium 7.8 (L) 8.9 - 10.3 mg/dL   Phosphorus 3.7 2.5 - 4.6 mg/dL   Albumin 2.1 (L) 3.5 - 5.0 g/dL   GFR, Estimated 20 (L) >60 mL/min    Comment: (NOTE) Calculated using the CKD-EPI Creatinine Equation (2021)    Anion gap 7 5 - 15    Comment: Performed at [facility], [address] [place], [place] [address]  I-STAT 7, (LYTES, BLD GAS, ICA, H+H)     Status: Abnormal   Collection Time: 10/26/22  5:22 PM  Result Value Ref Range   pH, Arterial 7.394 7.35 - 7.45   pCO2 arterial 42.6 32 - 48 mmHg   pO2, Arterial 137 (H) 83 - 108 mmHg   Bicarbonate 26.0 20.0 - 28.0 mmol/L   TCO2 27 22 - 32 mmol/L   O2 Saturation 99 %   Acid-Base Excess 1.0 0.0 - 2.0 mmol/L   Sodium 134 (L) 135 - 145 mmol/L   Potassium 4.9 3.5 - 5.1 mmol/L   Calcium, Ion 1.10 (L) 1.15 - 1.40 mmol/L   HCT 26.0 (L) 36.0 - 46.0 %   Hemoglobin 8.8 (L) 12.0 - 15.0 g/dL   Sample type ARTERIAL   Heparin level (unfractionated)     Status: Abnormal   Collection Time: 10/26/22  6:30 PM  Result Value Ref Range   Heparin Unfractionated >1.10 (H) 0.30 - 0.70 IU/mL    Comment: (NOTE) The clinical reportable range upper limit is being lowered to >1.10 to align with the FDA approved guidance for the current laboratory assay.  If heparin results are below expected values, and patient dosage has  been confirmed, suggest follow up testing of antithrombin III levels. Performed at [facility], [address], [place], [place] [address]   APTT     Status: Abnormal   Collection Time:  10/26/22  6:30 PM  Result Value Ref Range   aPTT 42 (H) 24 - 36 seconds    Comment:        IF BASELINE aPTT IS ELEVATED, SUGGEST PATIENT RISK ASSESSMENT BE USED TO DETERMINE APPROPRIATE ANTICOAGULANT THERAPY. Performed at [facility], [address], [place], [place] [address]   Glucose, capillary  Status: Abnormal   Collection Time: 10/26/22  8:33 PM  Result Value Ref Range   Glucose-Capillary 162 (H) 70 - 99 mg/dL    Comment: Glucose reference range applies only to samples taken after fasting for at least 8 hours.  Glucose, capillary     Status: Abnormal   Collection Time: 10/27/22 12:18 AM  Result Value Ref Range   Glucose-Capillary 138 (H) 70 - 99 mg/dL    Comment: Glucose reference range applies only to samples taken after fasting for at least 8 hours.  Renal function panel (daily at 0500)     Status: Abnormal   Collection Time: 10/27/22  4:20 AM  Result Value Ref Range   Sodium 135 135 - 145 mmol/L   Potassium 4.3 3.5 - 5.1 mmol/L   Chloride 100 98 - 111 mmol/L   CO2 25 22 - 32 mmol/L   Glucose, Bld 86 70 - 99 mg/dL    Comment: Glucose reference range applies only to samples taken after fasting for at least 8 hours.   BUN 55 (H) 8 - 23 mg/dL   Creatinine, Ser 0.98 (H) 0.44 - 1.00 mg/dL   Calcium 8.0 (L) 8.9 - 10.3 mg/dL   Phosphorus 4.5 2.5 - 4.6 mg/dL   Albumin 2.0 (L) 3.5 - 5.0 g/dL   GFR, Estimated 17 (L) >60 mL/min    Comment: (NOTE) Calculated using the CKD-EPI Creatinine Equation (2021)    Anion gap 10 5 - 15    Comment: Performed at [facility], [address], [place], [place] [address]  Magnesium     Status: None   Collection Time: 10/27/22  4:20 AM  Result Value Ref Range   Magnesium 2.1 1.7 - 2.4 mg/dL    Comment: Performed at [facility], [address] [place], [place] [address]  Iron and TIBC     Status: Abnormal   Collection Time: 10/27/22  4:20 AM  Result Value Ref Range   Iron 29 28 - 170 ug/dL   TIBC 621 (L) 308 - 657  ug/dL   Saturation Ratios 13 10.4 - 31.8 %   UIBC 195 ug/dL    Comment: Performed at [facility], [address], [place], [place] [address]  Ferritin     Status: None   Collection Time: 10/27/22  4:20 AM  Result Value Ref Range   Ferritin 78 11 - 307 ng/mL    Comment: Performed at [facility], [address], [place], [place] [address]  CBC     Status: Abnormal   Collection Time: 10/27/22  4:20 AM  Result Value Ref Range   WBC 13.6 (H) 4.0 - 10.5 K/uL   RBC 2.70 (L) 3.87 - 5.11 MIL/uL   Hemoglobin 7.6 (L) 12.0 - 15.0 g/dL   HCT 41.3 (L) 24.4 - 01.0 %   MCV 93.7 80.0 - 100.0 fL   MCH 28.1 26.0 - 34.0 pg   MCHC 30.0 30.0 - 36.0 g/dL   RDW 27.2 (H) 53.6 - 64.4 %   Platelets 391 150 - 400 K/uL   nRBC 0.0 0.0 - 0.2 %    Comment: Performed at [facility], [address] [place], [place] [address]  APTT     Status: Abnormal   Collection Time: 10/27/22  4:20 AM  Result Value Ref Range   aPTT 58 (H) 24 - 36 seconds    Comment:        IF BASELINE aPTT  IS ELEVATED, SUGGEST PATIENT RISK ASSESSMENT BE USED TO DETERMINE APPROPRIATE ANTICOAGULANT THERAPY. Performed at [facility], [address] [place], [place] [address]   Heparin level (unfractionated)     Status: Abnormal   Collection Time: 10/27/22  4:20 AM  Result Value Ref Range   Heparin Unfractionated >1.10 (H) 0.30 - 0.70 IU/mL    Comment: (NOTE) The clinical reportable range upper limit is being lowered to >1.10 to align with the FDA approved guidance for the current laboratory assay.  If heparin results are below expected values, and patient dosage has  been confirmed, suggest follow up testing of antithrombin III levels. Performed at [facility], [address] [place], [place] [address]   Glucose, capillary     Status: None   Collection Time: 10/27/22  4:36 AM  Result Value Ref Range   Glucose-Capillary 85 70 - 99 mg/dL    Comment: Glucose reference range applies only to samples  taken after fasting for at least 8 hours.  Glucose, capillary     Status: Abnormal   Collection Time: 10/27/22  7:40 AM  Result Value Ref Range   Glucose-Capillary 68 (L) 70 - 99 mg/dL    Comment: Glucose reference range applies only to samples taken after fasting for at least 8 hours.  Glucose, capillary     Status: Abnormal   Collection Time: 10/27/22  8:12 AM  Result Value Ref Range   Glucose-Capillary 118 (H) 70 - 99 mg/dL    Comment: Glucose reference range applies only to samples taken after fasting for at least 8 hours.  Glucose, capillary     Status: None   Collection Time: 10/27/22 11:27 AM  Result Value Ref Range   Glucose-Capillary 73 70 - 99 mg/dL    Comment: Glucose reference range applies only to samples taken after fasting for at least 8 hours.  Glucose, capillary     Status: None   Collection Time: 10/27/22 11:27 AM  Result Value Ref Range   Glucose-Capillary 73 70 - 99 mg/dL    Comment: Glucose reference range applies only to samples taken after fasting for at least 8 hours.  APTT     Status: Abnormal   Collection Time: 10/27/22  1:09 PM  Result Value Ref Range   aPTT 79 (H) 24 - 36 seconds    Comment:        IF BASELINE aPTT IS ELEVATED, SUGGEST PATIENT RISK ASSESSMENT BE USED TO DETERMINE APPROPRIATE ANTICOAGULANT THERAPY. Performed at [facility], [address] [place], [place] [address]   Renal function panel (daily at 1600)     Status: Abnormal   Collection Time: 10/27/22  3:26 PM  Result Value Ref Range   Sodium 137 135 - 145 mmol/L   Potassium 4.2 3.5 - 5.1 mmol/L   Chloride 102 98 - 111 mmol/L   CO2 25 22 - 32 mmol/L   Glucose, Bld 208 (H) 70 - 99 mg/dL    Comment: Glucose reference range applies only to samples taken after fasting for at least 8 hours.   BUN 57 (H) 8 - 23 mg/dL   Creatinine, Ser 9.14 (H) 0.44 - 1.00 mg/dL   Calcium 7.9 (L) 8.9 - 10.3 mg/dL   Phosphorus 4.5 2.5 - 4.6 mg/dL   Albumin 2.1 (L) 3.5 - 5.0 g/dL   GFR,  Estimated 16 (L) >60 mL/min    Comment: (NOTE) Calculated using the CKD-EPI Creatinine Equation (2021)    Anion gap  10 5 - 15    Comment: Performed at [facility], [address], [place], [place] [address]  Glucose, capillary     Status: Abnormal   Collection Time: 10/27/22  3:33 PM  Result Value Ref Range   Glucose-Capillary 187 (H) 70 - 99 mg/dL    Comment: Glucose reference range applies only to samples taken after fasting for at least 8 hours.  Glucose, capillary     Status: Abnormal   Collection Time: 10/27/22  8:01 PM  Result Value Ref Range   Glucose-Capillary 109 (H) 70 - 99 mg/dL    Comment: Glucose reference range applies only to samples taken after fasting for at least 8 hours.  Glucose, capillary     Status: Abnormal   Collection Time: 10/27/22 11:32 PM  Result Value Ref Range   Glucose-Capillary 116 (H) 70 - 99 mg/dL    Comment: Glucose reference range applies only to samples taken after fasting for at least 8 hours.  Glucose, capillary     Status: Abnormal   Collection Time: 10/28/22  3:35 AM  Result Value Ref Range   Glucose-Capillary 101 (H) 70 - 99 mg/dL    Comment: Glucose reference range applies only to samples taken after fasting for at least 8 hours.  Renal function panel (daily at 0500)     Status: Abnormal   Collection Time: 10/28/22  5:00 AM  Result Value Ref Range   Sodium 137 135 - 145 mmol/L   Potassium 4.1 3.5 - 5.1 mmol/L   Chloride 99 98 - 111 mmol/L   CO2 26 22 - 32 mmol/L   Glucose, Bld 100 (H) 70 - 99 mg/dL    Comment: Glucose reference range applies only to samples taken after fasting for at least 8 hours.   BUN 60 (H) 8 - 23 mg/dL   Creatinine, Ser 4.74 (H) 0.44 - 1.00 mg/dL   Calcium 8.3 (L) 8.9 - 10.3 mg/dL   Phosphorus 5.2 (H) 2.5 - 4.6 mg/dL   Albumin 2.1 (L) 3.5 - 5.0 g/dL   GFR, Estimated 15 (L) >60 mL/min    Comment: (NOTE) Calculated using the CKD-EPI Creatinine Equation (2021)    Anion gap 12 5 - 15    Comment: Performed  at [facility], [address] [place], [place] [address]  Magnesium     Status: None   Collection Time: 10/28/22  5:00 AM  Result Value Ref Range   Magnesium 2.1 1.7 - 2.4 mg/dL    Comment: Performed at [facility], [address], [place], [place] [address]  CBC     Status: Abnormal   Collection Time: 10/28/22  5:00 AM  Result Value Ref Range   WBC 12.3 (H) 4.0 - 10.5 K/uL   RBC 2.62 (L) 3.87 - 5.11 MIL/uL   Hemoglobin 7.3 (L) 12.0 - 15.0 g/dL   HCT 43.3 (L) 29.5 - 18.8 %   MCV 93.1 80.0 - 100.0 fL   MCH 27.9 26.0 - 34.0 pg   MCHC 29.9 (L) 30.0 - 36.0 g/dL   RDW 41.6 (H) 60.6 - 30.1 %   Platelets 370 150 - 400 K/uL   nRBC 0.0 0.0 - 0.2 %    Comment: Performed at [facility], [address], [place], [place] [address]  APTT     Status: Abnormal   Collection Time: 10/28/22  5:00 AM  Result Value Ref Range   aPTT 82 (H) 24 - 36 seconds  Comment:        IF BASELINE aPTT IS ELEVATED, SUGGEST PATIENT RISK ASSESSMENT BE USED TO DETERMINE APPROPRIATE ANTICOAGULANT THERAPY. Performed at [facility], [address], [place], [place] [address]   Heparin level (unfractionated)     Status: Abnormal   Collection Time: 10/28/22  5:00 AM  Result Value Ref Range   Heparin Unfractionated >1.10 (H) 0.30 - 0.70 IU/mL    Comment: (NOTE) The clinical reportable range upper limit is being lowered to >1.10 to align with the FDA approved guidance for the current laboratory assay.  If heparin results are below expected values, and patient dosage has  been confirmed, suggest follow up testing of antithrombin III levels. Performed at [facility], [address], [place], [place] [address]   Glucose, capillary     Status: Abnormal   Collection Time: 10/28/22  7:53 AM  Result Value Ref Range   Glucose-Capillary 206 (H) 70 - 99 mg/dL    Comment: Glucose reference range applies only to samples taken after fasting for at least 8 hours.    ECG    N/A  Telemetry   Sinus bradycardia with some PAC's and PVC's overnight- Personally Reviewed  Radiology    DG CHEST PORT 1 VIEW  Result Date: 10/27/2022 CLINICAL DATA:  Acute hypercapnic respiratory failure. EXAM: PORTABLE CHEST 1 VIEW COMPARISON:  October 25, 2022. FINDINGS: Stable cardiomegaly. Right internal jugular catheter is noted with tip in expected position of the SVC. Slightly decreased bibasilar opacities are noted suggesting improving atelectasis or edema. Small pleural effusions may be present. Bony thorax is unremarkable. IMPRESSION: Improved bibasilar opacities as described above. Electronically Signed   By: Lupita Raider M.D.   On: 10/27/2022 11:30    Cardiac Studies   N/A  Assessment   Principal Problem:   Acute respiratory failure with hypoxia and hypercapnia (HCC) Active Problems:   AKI (acute kidney injury) (HCC)   Junctional bradycardia   Hyperkalemia   Plan   Stable HR off dopamine. No further cardiac suggestions at this time. Volume status management as per nephrology. LVEF 70-75% on recent limited echo. Has EP and general cardiology follow-up scheduled in a few months. No further cardiac suggestions at this time. Will sign-off.  [facility] will sign off.   Medication Recommendations:  as above Other recommendations (labs, testing, etc):  none Follow up as an outpatient:  EP and general cardiology, which are scheduled   TIME SPENT WITH PATIENT: 25 minutes of direct patient care. More than 50% of that time was spent on coordination of care and counseling regarding bradycardia, AKI, hyperkalemia  Length of Stay:  LOS: 3 days   Chrystie Nose, MD, [facility], FACP  [place]  [facility]  Medical Director of the [facility]  [facility] Diplomate of the American Board of Clinical Lipidology Attending Cardiologist  Direct Dial: [phone]  Fax: [phone]  Website:   [email or web address] Nicely Brihana Quickel 10/28/2022, 9:03 AM

## 2022-10-28 NOTE — Progress Notes (Signed)
ANTICOAGULATION CONSULT NOTE  Pharmacy Consult for heparin  Indication: atrial fibrillation  Allergies  Allergen Reactions   Dulaglutide Nausea Only, Other (See Comments) and Cough    TRULICITY made the patient sick to her stomach (only) several days after using it   Oxycodone Itching and Other (See Comments)    Pt still takes this med even thought it causes itching   Trelegy Ellipta [Fluticasone-Umeclidin-Vilant] Nausea And Vomiting, Other (See Comments) and Cough    Made the patient sick to her stomach (only) several days after using it     Patient Measurements: Height: 5\' 5"  (165.1 cm) Weight: 103.9 kg (229 lb 0.9 oz) IBW/kg (Calculated) : 57 HEPARIN DW (KG): 81.2   Vital Signs: Temp: 97.7 F (36.5 C) (06/26 0756) Temp Source: Oral (06/26 0756) BP: 172/48 (06/25 2244) Pulse Rate: 55 (06/26 0700)  Labs: Recent Labs    10/25/22 1016 10/25/22 1555 10/26/22 0207 10/26/22 0213 10/26/22 1722 10/26/22 1830 10/27/22 0420 10/27/22 1309 10/27/22 1526 10/28/22 0500  HGB  --    < > 8.1*   < > 8.8*  --  7.6*  --   --  7.3*  HCT  --    < > 27.3*   < > 26.0*  --  25.3*  --   --  24.4*  PLT  --   --  422*  --   --   --  391  --   --  370  APTT  --    < > 29  --   --  42* 58* 79*  --  82*  HEPARINUNFRC  --   --   --   --   --  >1.10* >1.10*  --   --  >1.10*  CREATININE  --    < > 2.73*   < >  --   --  2.70*  --  2.81* 3.04*  TROPONINIHS 38*  --   --   --   --   --   --   --   --   --    < > = values in this interval not displayed.     Estimated Creatinine Clearance: 17.4 mL/min (A) (by C-G formula based on SCr of 3.04 mg/dL (H)).   Assessment: 82 yo female with AKI on CRRT. She is on apixaban PTA  (last dose 6/22 in the evening) for afib and plans are to start heparin in case of procedures.  - aPTT 82 sec (therapeutic) on infusion at 1400 units/hr.  -Hg= 7.3 (slow trend down)  Goal of Therapy:  Heparin level 0.3-0.7 units/ml aPTT 66-102 Monitor platelets by  anticoagulation protocol: Yes   Plan:  -Continue heparin at 1400 units/hr -Heparin level and aPTT in am  Harland German, PharmD Clinical Pharmacist **Pharmacist phone directory can now be found on [email or web address] ([email or web address]).  Listed under [facility].

## 2022-10-28 NOTE — Progress Notes (Signed)
eLink Physician-Brief Progress Note Patient Name: Kayla [name] DOB: 06-Jan-1941 MRN: [id number]   Date of Service  10/28/2022  HPI/Events of Note  Pt take Norco 10/325 at home every 6 hours as needed. Asking for a dose due to rib and back pain from coughing. No distress on camera. Eating breakfast.   eICU Interventions  One time low dose of 5 mg ordered.      Intervention Category Minor Interventions: Routine modifications to care plan (e.g. PRN medications for pain, fever)  Oretha Milch 10/28/2022, 6:58 AM

## 2022-10-29 DIAGNOSIS — R001 Bradycardia, unspecified: Secondary | ICD-10-CM | POA: Diagnosis not present

## 2022-10-29 DIAGNOSIS — N179 Acute kidney failure, unspecified: Secondary | ICD-10-CM | POA: Diagnosis not present

## 2022-10-29 DIAGNOSIS — J9601 Acute respiratory failure with hypoxia: Secondary | ICD-10-CM | POA: Diagnosis not present

## 2022-10-29 DIAGNOSIS — E875 Hyperkalemia: Secondary | ICD-10-CM | POA: Diagnosis not present

## 2022-10-29 LAB — RENAL FUNCTION PANEL
Albumin: 2.4 g/dL — ABNORMAL LOW (ref 3.5–5.0)
Anion gap: 14 (ref 5–15)
BUN: 73 mg/dL — ABNORMAL HIGH (ref 8–23)
CO2: 25 mmol/L (ref 22–32)
Calcium: 8.4 mg/dL — ABNORMAL LOW (ref 8.9–10.3)
Chloride: 98 mmol/L (ref 98–111)
Creatinine, Ser: 3.26 mg/dL — ABNORMAL HIGH (ref 0.44–1.00)
GFR, Estimated: 14 mL/min — ABNORMAL LOW (ref >60)
Glucose, Bld: 154 mg/dL — ABNORMAL HIGH (ref 70–99)
Phosphorus: 6.2 mg/dL — ABNORMAL HIGH (ref 2.5–4.6)
Potassium: 4.2 mmol/L (ref 3.5–5.1)
Sodium: 137 mmol/L (ref 135–145)

## 2022-10-29 LAB — CBC
HCT: 27.3 % — ABNORMAL LOW (ref 36.0–46.0)
Hemoglobin: 8.2 g/dL — ABNORMAL LOW (ref 12.0–15.0)
MCH: 28.8 pg (ref 26.0–34.0)
MCHC: 30 g/dL (ref 30.0–36.0)
MCV: 95.8 fL (ref 80.0–100.0)
Platelets: 375 10*3/uL (ref 150–400)
RBC: 2.85 MIL/uL — ABNORMAL LOW (ref 3.87–5.11)
RDW: 17.2 % — ABNORMAL HIGH (ref 11.5–15.5)
WBC: 13.3 10*3/uL — ABNORMAL HIGH (ref 4.0–10.5)
nRBC: 0 % (ref 0.0–0.2)

## 2022-10-29 LAB — CULTURE, BLOOD (ROUTINE X 2)
Culture: NO GROWTH
Special Requests: ADEQUATE

## 2022-10-29 LAB — GLUCOSE, CAPILLARY
Glucose-Capillary: 115 mg/dL — ABNORMAL HIGH (ref 70–99)
Glucose-Capillary: 156 mg/dL — ABNORMAL HIGH (ref 70–99)
Glucose-Capillary: 140 mg/dL — ABNORMAL HIGH (ref 70–99)
Glucose-Capillary: 145 mg/dL — ABNORMAL HIGH (ref 70–99)
Glucose-Capillary: 242 mg/dL — ABNORMAL HIGH (ref 70–99)

## 2022-10-29 LAB — MAGNESIUM: Magnesium: 2.1 mg/dL (ref 1.7–2.4)

## 2022-10-29 MED ORDER — MELATONIN 3 MG PO TABS
3.0000 mg | ORAL_TABLET | Freq: Every evening | ORAL | Status: DC | PRN
  Administered 2022-10-29 – 2022-11-13 (×9): 3 mg via ORAL
  Filled 2022-10-29 (×9): qty 1

## 2022-10-29 MED ORDER — APIXABAN 2.5 MG PO TABS
2.5000 mg | ORAL_TABLET | Freq: Two times a day (BID) | ORAL | Status: DC
  Administered 2022-10-29 – 2022-11-14 (×32): 2.5 mg via ORAL
  Filled 2022-10-29 (×32): qty 1

## 2022-10-29 MED ORDER — ISOSORBIDE MONONITRATE ER 30 MG PO TB24
30.0000 mg | ORAL_TABLET | Freq: Every day | ORAL | Status: DC
  Administered 2022-10-30 – 2022-11-09 (×11): 30 mg via ORAL
  Filled 2022-10-29 (×12): qty 1

## 2022-10-29 MED ORDER — SALINE SPRAY 0.65 % NA SOLN: 1.0000 | NASAL | Status: DC | PRN

## 2022-10-29 MED ORDER — ISOSORBIDE MONONITRATE ER 60 MG PO TB24
60.0000 mg | ORAL_TABLET | Freq: Every day | ORAL | Status: DC
  Administered 2022-10-29: 60 mg via ORAL
  Filled 2022-10-29: qty 1

## 2022-10-29 MED ORDER — INSULIN ASPART 100 UNIT/ML IJ SOLN
0.0000 [IU] | Freq: Three times a day (TID) | INTRAMUSCULAR | Status: DC
  Administered 2022-10-30: 2 [IU] via SUBCUTANEOUS
  Administered 2022-10-30 – 2022-10-31 (×4): 1 [IU] via SUBCUTANEOUS
  Administered 2022-11-01: 2 [IU] via SUBCUTANEOUS
  Administered 2022-11-01 – 2022-11-02 (×3): 1 [IU] via SUBCUTANEOUS
  Administered 2022-11-02: 3 [IU] via SUBCUTANEOUS
  Administered 2022-11-03 (×3): 1 [IU] via SUBCUTANEOUS
  Administered 2022-11-04: 2 [IU] via SUBCUTANEOUS
  Administered 2022-11-04: 1 [IU] via SUBCUTANEOUS
  Administered 2022-11-05: 2 [IU] via SUBCUTANEOUS
  Administered 2022-11-05 – 2022-11-06 (×3): 1 [IU] via SUBCUTANEOUS
  Administered 2022-11-06: 2 [IU] via SUBCUTANEOUS
  Administered 2022-11-07 (×2): 1 [IU] via SUBCUTANEOUS
  Administered 2022-11-08: 2 [IU] via SUBCUTANEOUS
  Administered 2022-11-08 – 2022-11-09 (×2): 1 [IU] via SUBCUTANEOUS
  Administered 2022-11-09: 2 [IU] via SUBCUTANEOUS
  Administered 2022-11-10: 1 [IU] via SUBCUTANEOUS
  Administered 2022-11-10 – 2022-11-11 (×2): 2 [IU] via SUBCUTANEOUS
  Administered 2022-11-11: 1 [IU] via SUBCUTANEOUS
  Administered 2022-11-12: 2 [IU] via SUBCUTANEOUS

## 2022-10-29 MED ORDER — LACTATED RINGERS IV BOLUS
500.0000 mL | Freq: Once | INTRAVENOUS | Status: AC
  Administered 2022-10-29: 500 mL via INTRAVENOUS

## 2022-10-29 NOTE — Progress Notes (Signed)
[facility] Progress Note    Assessment/ Plan:   Acute kidney injury on CKD stage IV likely due to CHF exacerbation/fluid overload/bradycardia leading to reduced renal perfusion.  B/L Cr around 2, followed by me as an outpatient. She had hyperkalemia, anasarca and did not respond with IV diuretics.  Also had features of uremic encephalopathy including tremor, AMS. -temp HD catheter placed on 6/23, started CRRT 6/23. CRRT off on 6/24 given access issues/alarms. RIJ temp line removed 6/26 -Labs pending, c/w lasix dose to 80mg  IV BID unless labs dictate otherwise  -no indications for HD/CRRT at this time-volume status stable and not exhibiting any uremic signs/symptoms. Side note: discussed with patient and husband and she would want to pursue dialysis in the long term. We did discuss this in the office as well and she is interested in home therapies down the road if/when indicated. -daily labs, strict I/O   Hyperkalemia: Received medical treatment in the ER.  Improved with CRRT (currently off). On lasix   Bradycardia: Seen by cardiologist.  Off dopamine gtt, HR stable   Acute on chronic respiratory failure/respiratory distress with hypoxia: lasix as above. CXR 6/25 revealed improvement in opacities. On 3L [place] at baseline   Sepsis due to possible pneumonia: On antibiotics per primary team.  Hyponatremia, mild: likely secondary to AKI and hypervolemia. WNL now  Anemia: transfuse for Hgb <7. ESA start 6/26  Discussed with primary service.  Subjective:   Patient seen and examined bedside in ICU. Husband at bedside.  No new complaints, to be getting out of bed. Labs not drawn this AM UOP ~2L   Objective:   BP (!) 155/49   Pulse (!) 41   Temp 98.4 F (36.9 C) (Oral)   Resp 17   Ht 5\' 5"  (1.651 m)   Wt 103.6 kg   SpO2 95%   BMI 38.01 kg/m   Intake/Output Summary (Last 24 hours) at 10/29/2022 0805 Last data filed at 10/29/2022 0600 Gross per 24 hour  Intake 744.72 ml   Output 2060 ml  Net -1315.28 ml   Weight change: -0.3 kg  Physical Exam: Gen: NAD CVS: RRR Resp: decreased breath sounds bibasilar, on [place], no increased WOB Abd: soft, nt/nd Ext: minimal pitting edema b/l Les (improving) Neuro: awake, alert, no asterixis Dialysis access: none  Imaging: DG CHEST PORT 1 VIEW  Result Date: 10/27/2022 CLINICAL DATA:  Acute hypercapnic respiratory failure. EXAM: PORTABLE CHEST 1 VIEW COMPARISON:  October 25, 2022. FINDINGS: Stable cardiomegaly. Right internal jugular catheter is noted with tip in expected position of the SVC. Slightly decreased bibasilar opacities are noted suggesting improving atelectasis or edema. Small pleural effusions may be present. Bony thorax is unremarkable. IMPRESSION: Improved bibasilar opacities as described above. Electronically Signed   By: Lupita Raider M.D.   On: 10/27/2022 11:30    Labs: BMET Recent Labs  Lab 10/25/22 0730 10/25/22 0745 10/25/22 1555 10/25/22 1601 10/26/22 0207 10/26/22 0213 10/26/22 1610 10/26/22 1722 10/27/22 0420 10/27/22 1526 10/28/22 0500  NA 134*   < > 134*   < > 135 137 133* 134* 135 137 137  K 6.6*   < > 6.6*   < > 4.5 4.8 4.4 4.9 4.3 4.2 4.1  CL 99  --  99  --  103  --  100  --  100 102 99  CO2 19*  --  21*  --  22  --  26  --  25 25 26   GLUCOSE 207*  --  164*  --  73  --  167*  --  86 208* 100*  BUN 120*  --  106*  --  63*  --  47*  --  55* 57* 60*  CREATININE 5.06*  --  4.45*  --  2.73*  --  2.34*  --  2.70* 2.81* 3.04*  CALCIUM 8.9  --  8.5*  --  7.6*  --  7.8*  --  8.0* 7.9* 8.3*  PHOS  --   --  5.8*  --  3.9  --  3.7  --  4.5 4.5 5.2*   < > = values in this interval not displayed.   CBC Recent Labs  Lab 10/25/22 0730 10/25/22 0745 10/26/22 0207 10/26/22 0213 10/26/22 1722 10/27/22 0420 10/28/22 0500  WBC 18.3*  --  11.6*  --   --  13.6* 12.3*  NEUTROABS 15.2*  --   --   --   --   --   --   HGB 8.4*   < > 8.1* 8.8* 8.8* 7.6* 7.3*  HCT 28.6*   < > 27.3* 26.0* 26.0*  25.3* 24.4*  MCV 97.3  --  94.1  --   --  93.7 93.1  PLT 478*  --  422*  --   --  391 370   < > = values in this interval not displayed.    Medications:     amLODipine  10 mg Oral Daily   atorvastatin  40 mg Oral Daily   bisacodyl  10 mg Rectal Daily   Or   bisacodyl  10 mg Oral Daily   Chlorhexidine Gluconate Cloth  6 each Topical Daily   cloNIDine  0.2 mg Transdermal Weekly   darbepoetin (ARANESP) injection - NON-DIALYSIS  60 mcg Subcutaneous Q Wed-1800   furosemide  80 mg Intravenous BID   guaiFENesin  600 mg Oral BID   insulin aspart  2-6 Units Subcutaneous Q4H   isosorbide mononitrate  60 mg Oral Daily   levothyroxine  200 mcg Oral Q0600   multivitamin  1 tablet Per Tube QHS   mouth rinse  15 mL Mouth Rinse 4 times per day   sodium chloride flush  3 mL Intravenous Q12H      Anthony Sar, MD [facility] 10/29/2022, 8:05 AM

## 2022-10-29 NOTE — Evaluation (Signed)
Physical Therapy Evaluation Patient Details Name: Kayla [name] MRN: [id number] DOB: 08/07/1940 Today's Date: 10/29/2022  History of Present Illness  Pt is 82 year old female brought to the ED 10/25/22 for SoB and AMS. Found to be in respiratory distress, HR 30s. ABG pH 7.1 with high pCO2 and low bicarbonate, Alo noted to have AKI. CRRT 6/23-6/24  PMH: obesity, type 2 diabetes, hypertension, hyperlipidemia, CAD, congestive heart failure, paroxysmal atrial fibrillation (on Eliquis), sick sinus syndrome status post pacemaker, CKD, anemia, COPD, GERD, hypothyroidism, chronic venous insufficiency, status post left first ray amputation (09/04/2022) with active osteomyelitis  Clinical Impression  PTA pt living with husband in single story home with ramped entrance. Pt with increased difficulty accounting for time from L toe amputation to current hospitalization. Pt's husband assist with filling in details. Pt has been walking with RW but is very determined to walk without AD which as lead to 2-3 falls in the last 2 weeks. When using RW pt able to ambulate without assist, husband had been assisting with ADLs, and iADLs. Pt is currently limited in safe mobility by very poor safety and deficit awareness, in presence of decreased strength and balance. Pt is min A for transfers and ambulation with RW. PT recommending resumption of HHPT at discharge. PT will refer to Mobility Specialist and will continue to follow acutely.        Recommendations for follow up therapy are one component of a multi-disciplinary discharge planning process, led by the attending physician.  Recommendations may be updated based on patient status, additional functional criteria and insurance authorization.     Assistance Recommended at Discharge Frequent or constant Supervision/Assistance  Patient can return home with the following  A little help with walking and/or transfers;A little help with bathing/dressing/bathroom;Assistance with  cooking/housework;Direct supervision/assist for medications management;Direct supervision/assist for financial management;Assist for transportation;Help with stairs or ramp for entrance    Equipment Recommendations None recommended by PT  Recommendations for Other Services       Functional Status Assessment Patient has had a recent decline in their functional status and demonstrates the ability to make significant improvements in function in a reasonable and predictable amount of time.     Precautions / Restrictions Precautions Precautions: Fall Precaution Comments: 2x falls in week before hospitalization, with multiple falls in past year, post surgery pt to wear post op shoe, messaged MD and no longer needs to use. Restrictions Weight Bearing Restrictions: No (Simultaneous filing. User may not have seen previous data.)      Mobility  Bed Mobility               General bed mobility comments: sitting up in recliner on entry    Transfers Overall transfer level: Needs assistance Equipment used: Rolling walker (2 wheels), 1 person hand held assist Transfers: Sit to/from Stand, Bed to chair/wheelchair/BSC Sit to Stand: Min assist   Step pivot transfers: Min assist       General transfer comment: pt generally unsteady requiring min A for steady with standing up from recliner with HHA and also to RW, min A for stepping transfer from recliner to [facility]    Ambulation/Gait Ambulation/Gait assistance: Min assist Gait Distance (Feet): 12 Feet (+20, +15) Assistive device: Rolling walker (2 wheels) Gait Pattern/deviations: Step-through pattern, Trunk flexed, Shuffle Gait velocity: slowed Gait velocity interpretation: <1.8 ft/sec, indicate of risk for recurrent falls   General Gait Details: min A for steadying, with slowed shuffling gait, vc for upright posture and proximity to RW.  frequent reminding not to turn loose of the RW, pt with bilateral knee buckling with fatigue at end of  3rd bout of ambulation         Balance Overall balance assessment: Needs assistance Sitting-balance support: Feet supported, No upper extremity supported Sitting balance-Leahy Scale: Fair     Standing balance support: Bilateral upper extremity supported, Single extremity supported, During functional activity, Reliant on assistive device for balance Standing balance-Leahy Scale: Poor Standing balance comment: requires at least single UE support for dynamic standing                             Pertinent Vitals/Pain Pain Assessment Pain Assessment: 0-10 Pain Score: 9  Pain Location: ribs Pain Descriptors / Indicators: Discomfort, Grimacing, Guarding Pain Intervention(s): Limited activity within patient's tolerance, Monitored during session, Repositioned    Home Living Family/patient expects to be discharged to:: Private residence Living Arrangements: Spouse/significant other Available Help at Discharge: Family;Available 24 hours/day Type of Home: House Home Access: Ramped entrance       Home Layout: One level Home Equipment: Agricultural consultant (2 wheels);Cane - single point;Shower seat;Tub bench (3L O2 at baseline)      Prior Function Prior Level of Function : Needs assist             Mobility Comments: walked with RW short distances in home, HHPT had not started, ADLs Comments: husband assisting with ADL and iADLs     Hand Dominance   Dominant Hand: Right    Extremity/Trunk Assessment   Upper Extremity Assessment Upper Extremity Assessment: Defer to OT evaluation    Lower Extremity Assessment Lower Extremity Assessment: LLE deficits/detail;Generalized weakness LLE Deficits / Details: L great and 4th toe amputation LLE Sensation: decreased light touch    Cervical / Trunk Assessment Cervical / Trunk Assessment: Kyphotic  Communication   Communication: No difficulties  Cognition Arousal/Alertness: Awake/alert Behavior During Therapy: WFL for  tasks assessed/performed Overall Cognitive Status: Impaired/Different from baseline Area of Impairment: Following commands, Safety/judgement, Awareness, Problem solving                       Following Commands: Follows one step commands inconsistently, Follows multi-step commands inconsistently, Follows multi-step commands with increased time Safety/Judgement: Decreased awareness of safety, Decreased awareness of deficits Awareness: Emergent Problem Solving: Slow processing, Requires verbal cues, Requires tactile cues General Comments: pt with very poor awareness of her deficits, wanting to walk without DME despite history of many falls, requires increased time for processing        General Comments General comments (skin integrity, edema, etc.): pt with complaints of dizziness at end of bouts of ambulation despite BP 1556/72, SpO2 on 4L O2 98%O2, HR 59 with first bout of walking and similar results after second bout of walking,        Assessment/Plan    PT Assessment Patient needs continued PT services  PT Problem List Decreased strength;Decreased activity tolerance;Decreased balance;Decreased mobility;Decreased coordination;Decreased cognition;Decreased safety awareness;Decreased knowledge of use of DME;Cardiopulmonary status limiting activity;Pain       PT Treatment Interventions DME instruction;Gait training;Functional mobility training;Therapeutic activities;Therapeutic exercise;Balance training;Cognitive remediation;Patient/family education    PT Goals (Current goals can be found in the Care Plan section)  Acute Rehab PT Goals PT Goal Formulation: With patient/family Time For Goal Achievement: 11/13/22 Potential to Achieve Goals: Fair    Frequency Min 1X/week        AM-PAC PT "6 Clicks" Mobility  Outcome Measure Help needed turning from your back to your side while in a flat bed without using bedrails?: None Help needed moving from lying on your back to  sitting on the side of a flat bed without using bedrails?: None Help needed moving to and from a bed to a chair (including a wheelchair)?: A Little Help needed standing up from a chair using your arms (e.g., wheelchair or bedside chair)?: A Little Help needed to walk in hospital room?: A Little Help needed climbing 3-5 steps with a railing? : A Lot 6 Click Score: 19    End of Session Equipment Utilized During Treatment: Gait belt;Oxygen Activity Tolerance: Patient tolerated treatment well Patient left: in chair;with call bell/phone within reach;with family/visitor present;with chair alarm set Nurse Communication: Mobility status PT Visit Diagnosis: Unsteadiness on feet (R26.81);Other abnormalities of gait and mobility (R26.89);Repeated falls (R29.6);History of falling (Z91.81);Muscle weakness (generalized) (M62.81);Difficulty in walking, not elsewhere classified (R26.2);Pain;Dizziness and giddiness (R42) Pain - part of body:  (ribs)    Time: 6606-3016 PT Time Calculation (min) (ACUTE ONLY): 63 min   Charges:   PT Evaluation $PT Eval Moderate Complexity: 1 Mod PT Treatments $Gait Training: 23-37 mins $Therapeutic Activity: 8-22 mins        Sharika Mosquera B. Beverely Risen PT, DPT Acute Rehabilitation Services Please use secure chat or  Call Office [phone]   Elon Alas [facility] 10/29/2022, 10:31 AM

## 2022-10-29 NOTE — Progress Notes (Signed)
eLink Physician-Brief Progress Note Patient Name: Kayla [name] DOB: Jun 29, 1940 MRN: [id number]   Date of Service  10/29/2022  HPI/Events of Note  SSI changed from every 4 hrs to [facility]  eICU Interventions       Intervention Category Intermediate Interventions: Hyperglycemia - evaluation and treatment  LEBA, TIBBITTS, P 10/29/2022, 9:37 PM

## 2022-10-29 NOTE — Plan of Care (Signed)
  Problem: Education: Goal: Knowledge of General Education information will improve Description: Including pain rating scale, medication(s)/side effects and non-pharmacologic comfort measures Outcome: Progressing   Problem: Clinical Measurements: Goal: Ability to maintain clinical measurements within normal limits will improve Outcome: Progressing Goal: Diagnostic test results will improve Outcome: Progressing Goal: Cardiovascular complication will be avoided Outcome: Progressing   Problem: Nutrition: Goal: Adequate nutrition will be maintained Outcome: Progressing   Problem: Elimination: Goal: Will not experience complications related to urinary retention Outcome: Progressing   Problem: Pain Managment: Goal: General experience of comfort will improve Outcome: Progressing   Problem: Safety: Goal: Ability to remain free from injury will improve Outcome: Progressing   Problem: Skin Integrity: Goal: Risk for impaired skin integrity will decrease Outcome: Progressing

## 2022-10-29 NOTE — Progress Notes (Signed)
Pt not requiring BIPAP tonight no distress noted.

## 2022-10-29 NOTE — Evaluation (Signed)
Occupational Therapy Evaluation Patient Details Name: Kayla [name] MRN: [id number] DOB: 04/04/1941 Today's Date: 10/29/2022   History of Present Illness Pt is 82 year old female brought to the ED 10/25/22 for SoB and AMS. Found to be in respiratory distress, HR 30s. ABG pH 7.1 with high pCO2 and low bicarbonate, Alo noted to have AKI. CRRT 6/23-6/24  PMH: obesity, type 2 diabetes, hypertension, hyperlipidemia, CAD, congestive heart failure, paroxysmal atrial fibrillation (on Eliquis), sick sinus syndrome status post pacemaker, CKD, anemia, COPD, GERD, hypothyroidism, chronic venous insufficiency, status post left first ray amputation (09/04/2022) with active osteomyelitis   Clinical Impression   Patient admitted for the diagnosis above.  PTA she lives at home with her spouse, who is able to provide assist as needed.  Patient had just returned home post SNF Rehab stay, and was receiving [facility] services.  Currently the patient is needing Min A for basic mobility over short distances, and is needing closer to Mod A for lower body ADL from a sit to stand level.  OT is indicated in the acute setting to address deficits, and home with [facility] is the plan.        Recommendations for follow up therapy are one component of a multi-disciplinary discharge planning process, led by the attending physician.  Recommendations may be updated based on patient status, additional functional criteria and insurance authorization.   Assistance Recommended at Discharge Frequent or constant Supervision/Assistance  Patient can return home with the following A little help with walking and/or transfers;Direct supervision/assist for medications management;Direct supervision/assist for financial management;Assist for transportation;Assistance with cooking/housework;A little help with bathing/dressing/bathroom    Functional Status Assessment  Patient has had a recent decline in their functional status and demonstrates the ability to  make significant improvements in function in a reasonable and predictable amount of time.  Equipment Recommendations  None recommended by OT    Recommendations for Other Services       Precautions / Restrictions Precautions Precautions: Fall Restrictions Weight Bearing Restrictions: No      Mobility Bed Mobility Overal bed mobility: Needs Assistance Bed Mobility: Supine to Sit, Sit to Supine     Supine to sit: Mod assist Sit to supine: Min assist     Patient Response: Cooperative  Transfers Overall transfer level: Needs assistance Equipment used: Rolling walker (2 wheels) Transfers: Sit to/from Stand, Bed to chair/wheelchair/BSC Sit to Stand: Min assist     Step pivot transfers: Min assist            Balance Overall balance assessment: Needs assistance Sitting-balance support: Feet supported, No upper extremity supported Sitting balance-Leahy Scale: Fair     Standing balance support: Reliant on assistive device for balance Standing balance-Leahy Scale: Poor                             ADL either performed or assessed with clinical judgement   ADL Overall ADL's : Needs assistance/impaired Eating/Feeding: Supervision/ safety;Sitting   Grooming: Wash/dry hands;Wash/dry face;Supervision/safety;Sitting           Upper Body Dressing : Minimal assistance;Sitting   Lower Body Dressing: Minimal assistance;Moderate assistance;Sit to/from stand   Toilet Transfer: Min Engineer, building services;Ambulation;Rolling walker (2 wheels)                   Vision Patient Visual Report: No change from baseline       Perception     Praxis  Pertinent Vitals/Pain Pain Assessment Pain Assessment: Faces Faces Pain Scale: Hurts even more Pain Location: ribs Pain Descriptors / Indicators: Discomfort, Grimacing, Guarding Pain Intervention(s): Monitored during session     Hand Dominance Right   Extremity/Trunk Assessment  Upper Extremity Assessment Upper Extremity Assessment: Generalized weakness   Lower Extremity Assessment Lower Extremity Assessment: Defer to PT evaluation       Communication Communication Communication: No difficulties   Cognition Arousal/Alertness: Awake/alert Behavior During Therapy: WFL for tasks assessed/performed Overall Cognitive Status: Impaired/Different from baseline                         Following Commands: Follows one step commands with increased time Safety/Judgement: Decreased awareness of safety, Decreased awareness of deficits Awareness: Emergent Problem Solving: Slow processing, Requires verbal cues, Requires tactile cues       General Comments   VSS on supplemental O2    Exercises     Shoulder Instructions      Home Living Family/patient expects to be discharged to:: Private residence Living Arrangements: Spouse/significant other Available Help at Discharge: Family;Available 24 hours/day Type of Home: House Home Access: Ramped entrance     Home Layout: One level     Bathroom Shower/Tub: Chief Strategy Officer: Handicapped height Bathroom Accessibility: Yes How Accessible: Accessible via walker Home Equipment: Rolling Walker (2 wheels);Cane - single point;Shower seat;Tub bench          Prior Functioning/Environment Prior Level of Function : Needs assist             Mobility Comments: walked with RW short distances in home, HHPT had not started, ADLs Comments: husband assisting with ADL and iADLs        OT Problem List: Decreased strength;Decreased activity tolerance;Impaired balance (sitting and/or standing);Decreased safety awareness;Decreased cognition;Pain;Increased edema      OT Treatment/Interventions: Self-care/ADL training;Therapeutic activities;Patient/family education;Balance training;DME and/or AE instruction    OT Goals(Current goals can be found in the care plan section) Acute Rehab OT  Goals Patient Stated Goal: Return home OT Goal Formulation: With patient Time For Goal Achievement: 11/12/22 Potential to Achieve Goals: Good ADL Goals Pt Will Perform Grooming: standing;with supervision Pt Will Perform Upper Body Dressing: with set-up;sitting Pt Will Perform Lower Body Dressing: with min assist;sit to/from stand Pt Will Transfer to Toilet: with modified independence;ambulating;regular height toilet  OT Frequency: Min 1X/week    Co-evaluation              AM-PAC OT "6 Clicks" Daily Activity     Outcome Measure Help from another person eating meals?: None Help from another person taking care of personal grooming?: None Help from another person toileting, which includes using toliet, bedpan, or urinal?: A Little Help from another person bathing (including washing, rinsing, drying)?: A Lot Help from another person to put on and taking off regular upper body clothing?: A Little Help from another person to put on and taking off regular lower body clothing?: A Lot 6 Click Score: 18   End of Session Equipment Utilized During Treatment: Rolling walker (2 wheels);Oxygen Nurse Communication: Mobility status  Activity Tolerance: Patient tolerated treatment well Patient left: in bed;with call bell/phone within reach;with family/visitor present  OT Visit Diagnosis: Unsteadiness on feet (R26.81);Muscle weakness (generalized) (M62.81);Other symptoms and signs involving cognitive function                Time: 1535-1558 OT Time Calculation (min): 23 min Charges:  OT General Charges $OT Visit: 1  Visit OT Evaluation $OT Eval Moderate Complexity: 1 Mod OT Treatments $Self Care/Home Management : 8-22 mins  10/29/2022  RP, OTR/L  Acute Rehabilitation Services  Office:  [phone]   Suzanna Obey 10/29/2022, 4:08 PM

## 2022-10-29 NOTE — Progress Notes (Signed)
NAME:  Kayla [name], MRN:  [id number], DOB:  Jun 09, 1940, LOS: 4 ADMISSION DATE:  10/25/2022, CONSULTATION DATE: 10/25/2022 REFERRING MD: Dr. Katrinka Blazing, CHIEF COMPLAINT: Altered mental status and shortness of breath  History of Present Illness:  82 year old female with hypertension, hyperlipidemia, CKD stage IV coronary artery disease, paroxysmal A-fib on Eliquis was brought into the emergency department accompanied by her husband because of respiratory distress and altered mental status.  As per patient's husband she is having increasing shortness of breath for last few days but this morning it got worse, so he called EMS, upon arrival patient was found to be in respiratory distress, initially BVM ventilation was performed, she was noted to be bradycardic with heart rate in 30s, started on transcutaneous pacing, initially was placed on nonrebreather facemask.  In ED patient was switched from facemask to BiPAP, she was noted to be severely bradycardic with heart rate in 30s with stable systolic blood pressure around 140.  She was confused, minimally responsive, ABG was done which showed pH 7.1 with high pCO2 and low bicarbonate.  Also noted to have AKI with baseline serum creatinine around 2.5 and admission creatinine of 5.4 with serum potassium of 6.6.  She was given calcium gluconate, sodium bicarbonate and Lasix.  PCCM was consulted for help evaluation medical management  Pertinent  Medical History   Past Medical History:  Diagnosis Date   Allergy    Anxiety    Arthritis    CAD (coronary artery disease)    Mild per remote cath in 2006, /    Nuclear, January, 2013, no significant abnormality,   Carotid artery disease (HCC)    Doppler, January, 2013, 0 39% RIC A., 40-59% LICA, stable   Cataract    CHF (congestive heart failure) (HCC)    Chronic kidney disease    Complication of anesthesia    wakes up "mean"   COPD 02/16/2007   Intolerant of Spiriva, tudorza Intolerant of Trelegy    COPD  (chronic obstructive pulmonary disease) (HCC)    Coronary atherosclerosis 11/24/2008   Catheterization 2006, nonobstructive coronary disease   //   nuclear 2013, low risk     Diabetes mellitus, type 2 (HCC)    Diverticula of colon    DIVERTICULOSIS OF COLON 03/23/2007   Qualifier: Diagnosis of  By: Craige Cotta MD, Vineet     Dizziness    occasional   Dizzy spells 05/04/2011   DM (diabetes mellitus) (HCC) 03/23/2007   Qualifier: Diagnosis of  By: Craige Cotta MD, Vineet     Dyspnea    On exertion   Ejection fraction    Essential hypertension 02/16/2007   Qualifier: Diagnosis of  By: Earlene Plater CMA, Tammy     G E R D 03/23/2007   Qualifier: Diagnosis of  By: Craige Cotta MD, Vineet     Gall bladder disease 04/28/2016   Gastritis and gastroduodenitis    GERD (gastroesophageal reflux disease)    Goiter    hx of   Headache(784.0)    migraine   Hyperlipidemia    Hypertension    Hypothyroidism    HYPOTHYROIDISM, POSTSURGICAL 06/04/2008   Qualifier: Diagnosis of  By: Everardo All MD, Sean A    Left ventricular hypertrophy    Leg edema    occasional swelling   Leg swelling 12/09/2018   Myofascial pain syndrome, cervical 01/06/2018   Obesity    OBESITY 11/24/2008   Qualifier: Diagnosis of  By: Vikki Ports     OBSTRUCTIVE SLEEP APNEA 02/16/2007   Auto CPAP  09/03/13 to 10/02/13 >> used on 30 of 30 nights with average 6 hrs and 3 min.  Average AHI is 3.1 with median CPAP 5 cm H2O and 95 th percentile CPAP 6 cm H2O.     Occipital neuralgia of right side 01/06/2018   OSA (obstructive sleep apnea)    cpap setting of 12   Pain in joint of right hip 01/21/2018   Palpitations 01/21/2011   48 hour Holter, 2015, scattered PACs and PVCs.    Paroxysmal atrial fibrillation (HCC)    Pneumonia    PONV (postoperative nausea and vomiting)    severe after every surgery   Pulmonary hypertension, secondary    RHINITIS 07/21/2010        RUQ abdominal pain    S/P left TKA 07/27/2011   Sinus node dysfunction (HCC) 11/08/2015    Sleep apnea    Spinal stenosis of cervical region 11/07/2014   Urinary frequency 12/09/2018   UTI (urinary tract infection) 08/15/2019   per pt report   VENTRICULAR HYPERTROPHY, LEFT 11/24/2008   Qualifier: Diagnosis of  By: Vikki Ports       Significant Hospital Events: Including procedures, antibiotic start and stop dates in addition to other pertinent events     Interim History / Subjective:  No overnight issues This morning she was complaining of rib pain  Blood pressure remained high, systolic reaching to 160s Refused BiPAP overnight  Objective   Blood pressure (!) 155/49, pulse (!) 41, temperature 98.4 F (36.9 C), temperature source Oral, resp. rate 17, height 5\' 5"  (1.651 m), weight 103.6 kg, SpO2 95 %.        Intake/Output Summary (Last 24 hours) at 10/29/2022 0800 Last data filed at 10/29/2022 0600 Gross per 24 hour  Intake 744.72 ml  Output 2060 ml  Net -1315.28 ml   Filed Weights   10/27/22 0500 10/28/22 0600 10/29/22 0600  Weight: 110.1 kg 103.9 kg 103.6 kg    Examination: Physical exam: General: Acute on chronically ill-appearing obese female, sitting on recliner HEENT: [place]/AT, eyes anicteric.  moist mucus membranes Neuro: Alert, awake following commands Chest: Coarse breath sounds all over, no wheezes or rhonchi Heart: Bradycardic, regular rhythm, systolic murmur heard Abdomen: Soft, nontender, nondistended, bowel sounds present Skin: No rash  Labs still pending  Resolved Hospital Problem list   Mixed metabolic and respiratory acidosis Hyperkalemia/hyponatremia  Assessment & Plan:  Acute on chronic hypoxic/hypercapnic respiratory failure Acute on chronic HFpEF AKI on CKD stage IV Acute metabolic/uremic encephalopathy, improving Sepsis due to community-acquired pneumonia Bradycardia/junctional rhythm, history of sick sinus syndrome Paroxysmal A-fib Anemia of chronic disease  Patient refused BiPAP overnight Remains on 4 to 5 L oxygen  which is close to her baseline Titrate with O2 sat goal 92% Hypercapnia has cleared, mental status is improved Received CRRT for 48 hours Now making good urine HD catheter was removed Made about 2 L urine last 24 hours Repeat labs are pending this morning Nephrology is following Continue IV Lasix 80 mg twice daily CRRT has been off for 2 days Continue Mucinex Complete 5 days of Zosyn Remain off vasopressors and chronotropic agent Appreciate cardiology follow-up, recommend no need for pacemaker placement Remain in sinus bradycardia in 50s Switch IV heparin infusion to Eliquis for stroke prophylaxis Closely monitor electrolytes Monitor intake and output Avoid nephrotoxic agents Monitor H&H  Transfer to progressive care  Best Practice (right click and "Reselect all SmartList Selections" daily)   Diet/type: Regular consistency DVT prophylaxis: Heparin gtt to Eliquis GI  prophylaxis: N/A Lines: Discontinue today Foley: Discontinued today Code Status:  DNR Last date of multidisciplinary goals of care discussion [6/27: Patient and her husband updated at bedside Labs   CBC: Recent Labs  Lab 10/25/22 0730 10/25/22 0745 10/26/22 0207 10/26/22 0213 10/26/22 1722 10/27/22 0420 10/28/22 0500  WBC 18.3*  --  11.6*  --   --  13.6* 12.3*  NEUTROABS 15.2*  --   --   --   --   --   --   HGB 8.4*   < > 8.1* 8.8* 8.8* 7.6* 7.3*  HCT 28.6*   < > 27.3* 26.0* 26.0* 25.3* 24.4*  MCV 97.3  --  94.1  --   --  93.7 93.1  PLT 478*  --  422*  --   --  391 370   < > = values in this interval not displayed.    Basic Metabolic Panel: Recent Labs  Lab 10/26/22 0207 10/26/22 0213 10/26/22 1610 10/26/22 1722 10/27/22 0420 10/27/22 1526 10/28/22 0500  NA 135   < > 133* 134* 135 137 137  K 4.5   < > 4.4 4.9 4.3 4.2 4.1  CL 103  --  100  --  100 102 99  CO2 22  --  26  --  25 25 26   GLUCOSE 73  --  167*  --  86 208* 100*  BUN 63*  --  47*  --  55* 57* 60*  CREATININE 2.73*  --  2.34*   --  2.70* 2.81* 3.04*  CALCIUM 7.6*  --  7.8*  --  8.0* 7.9* 8.3*  MG 2.1  --   --   --  2.1  --  2.1  PHOS 3.9  --  3.7  --  4.5 4.5 5.2*   < > = values in this interval not displayed.   GFR: Estimated Creatinine Clearance: 17.3 mL/min (A) (by C-G formula based on SCr of 3.04 mg/dL (H)). Recent Labs  Lab 10/25/22 0730 10/25/22 0748 10/25/22 1016 10/26/22 0207 10/27/22 0420 10/28/22 0500  PROCALCITON  --   --  0.16  --   --   --   WBC 18.3*  --   --  11.6* 13.6* 12.3*  LATICACIDVEN  --  0.6  --   --   --   --     Liver Function Tests: Recent Labs  Lab 10/26/22 0207 10/26/22 1610 10/27/22 0420 10/27/22 1526 10/28/22 0500  AST 14*  --   --   --   --   ALT 18  --   --   --   --   ALKPHOS 49  --   --   --   --   BILITOT 0.5  --   --   --   --   PROT 6.0*  --   --   --   --   ALBUMIN 2.2* 2.1* 2.0* 2.1* 2.1*   No results for input(s): "LIPASE", "AMYLASE" in the last 168 hours. No results for input(s): "AMMONIA" in the last 168 hours.  ABG    Component Value Date/Time   PHART 7.394 10/26/2022 1722   PCO2ART 42.6 10/26/2022 1722   PO2ART 137 (H) 10/26/2022 1722   HCO3 26.0 10/26/2022 1722   TCO2 27 10/26/2022 1722   ACIDBASEDEF 5.0 (H) 10/25/2022 1601   O2SAT 99 10/26/2022 1722     Coagulation Profile: No results for input(s): "INR", "PROTIME" in the last 168 hours.  Cardiac Enzymes: No  results for input(s): "CKTOTAL", "CKMB", "CKMBINDEX", "TROPONINI" in the last 168 hours.  HbA1C: Hgb A1c MFr Bld  Date/Time Value Ref Range Status  09/04/2022 01:29 AM 6.8 (H) 4.8 - 5.6 % Final    Comment:    (NOTE) Pre diabetes:          5.7%-6.4%  Diabetes:              >6.4%  Glycemic control for   <7.0% adults with diabetes   05/30/2021 10:23 AM 8.4 (H) 4.8 - 5.6 % Final    Comment:    (NOTE) Pre diabetes:          5.7%-6.4%  Diabetes:              >6.4%  Glycemic control for   <7.0% adults with diabetes     CBG: Recent Labs  Lab 10/28/22 1548  10/28/22 1939 10/28/22 2328 10/29/22 0352 10/29/22 0740  GLUCAP 182* 184* 121* 115* 156*      Cheri Fowler, MD [facility] See Amion for pager If no response to pager, please call [phone] until 7pm After 7pm, Please call E-link [phone]

## 2022-10-30 DIAGNOSIS — J9601 Acute respiratory failure with hypoxia: Secondary | ICD-10-CM | POA: Diagnosis not present

## 2022-10-30 DIAGNOSIS — J9602 Acute respiratory failure with hypercapnia: Secondary | ICD-10-CM | POA: Diagnosis not present

## 2022-10-30 LAB — CULTURE, BLOOD (ROUTINE X 2)

## 2022-10-30 LAB — RENAL FUNCTION PANEL
Albumin: 2 g/dL — ABNORMAL LOW (ref 3.5–5.0)
Anion gap: 15 (ref 5–15)
BUN: 67 mg/dL — ABNORMAL HIGH (ref 8–23)
CO2: 26 mmol/L (ref 22–32)
Calcium: 8.5 mg/dL — ABNORMAL LOW (ref 8.9–10.3)
Chloride: 96 mmol/L — ABNORMAL LOW (ref 98–111)
Creatinine, Ser: 3.34 mg/dL — ABNORMAL HIGH (ref 0.44–1.00)
GFR, Estimated: 13 mL/min — ABNORMAL LOW (ref >60)
Glucose, Bld: 176 mg/dL — ABNORMAL HIGH (ref 70–99)
Phosphorus: 6.3 mg/dL — ABNORMAL HIGH (ref 2.5–4.6)
Potassium: 4.3 mmol/L (ref 3.5–5.1)
Sodium: 137 mmol/L (ref 135–145)

## 2022-10-30 LAB — CBC
HCT: 23.3 % — ABNORMAL LOW (ref 36.0–46.0)
Hemoglobin: 7 g/dL — ABNORMAL LOW (ref 12.0–15.0)
MCH: 29.2 pg (ref 26.0–34.0)
MCHC: 30 g/dL (ref 30.0–36.0)
MCV: 97.1 fL (ref 80.0–100.0)
Platelets: 335 10*3/uL (ref 150–400)
RBC: 2.4 MIL/uL — ABNORMAL LOW (ref 3.87–5.11)
RDW: 17.1 % — ABNORMAL HIGH (ref 11.5–15.5)
WBC: 12.4 10*3/uL — ABNORMAL HIGH (ref 4.0–10.5)
nRBC: 0 % (ref 0.0–0.2)

## 2022-10-30 LAB — GLUCOSE, CAPILLARY
Glucose-Capillary: 166 mg/dL — ABNORMAL HIGH (ref 70–99)
Glucose-Capillary: 210 mg/dL — ABNORMAL HIGH (ref 70–99)
Glucose-Capillary: 172 mg/dL — ABNORMAL HIGH (ref 70–99)
Glucose-Capillary: 148 mg/dL — ABNORMAL HIGH (ref 70–99)

## 2022-10-30 LAB — MAGNESIUM: Magnesium: 2 mg/dL (ref 1.7–2.4)

## 2022-10-30 MED ORDER — FUROSEMIDE 10 MG/ML IJ SOLN
60.0000 mg | Freq: Once | INTRAMUSCULAR | Status: AC
  Administered 2022-10-30: 60 mg via INTRAVENOUS
  Filled 2022-10-30: qty 6

## 2022-10-30 MED ORDER — SODIUM CHLORIDE 0.9 % IV SOLN
510.0000 mg | Freq: Once | INTRAVENOUS | Status: AC
  Administered 2022-10-30: 510 mg via INTRAVENOUS
  Filled 2022-10-30: qty 17

## 2022-10-30 NOTE — Progress Notes (Signed)
PROGRESS NOTE    Kayla [name]  ZOX:[id number] DOB: Apr 22, 1941 DOA: 10/25/2022 PCP: Ailene Ravel, MD  Outpatient Specialists:     Brief Narrative:  Patient is an 82 year old Caucasian female, significantly obese, with recent history of MRSA bacteremia/infective endocarditis for which patient was on antibiotics for a long time.  Other medical history includes coronary artery disease, carotid artery disease, chronic kidney disease stage IV, COPD, diabetes mellitus type 2, hypertension, hyperlipidemia, hypothyroidism and documented congestive heart failure.  Recent echocardiograms have revealed normal left ventricular ejection fraction, without mention of diastolic dysfunction.  Patient was admitted with pulmonary edema, acute kidney injury on chronic kidney disease stage IV with oliguria versus anuria, requiring CRRT for about 48 hours.  Improvement in renal function is noted.  Patient continues to make urine.  Hypercapnic respiratory failure was also noted on presentation.  Patient has been on home oxygen for but a year.  Patient was admitted to ICU, underwent CRRT, treated for respiratory failure and pulmonary edema.  Patient has been transferred to the care of the hospitalist team today, 10/30/2022.  10/30/2022: Patient seen alongside patient's husband.  Patient seems to be improving.  However, long-term prognosis remains guarded.  Troponins were flat.  I have not visualized workup for bilateral renal artery stenosis.  Nephrology and pulmonary input is highly appreciated.  BMP done earlier today revealed sodium of 137, potassium of 4.3, chloride 96, CO2 26, BUN of 67 with serum creatinine of 3.34.  Patient's baseline serum creatinine was 2.1.  Serum creatinine peaked at 5.06.  Assessment & Plan:   Principal Problem:   Acute respiratory failure with hypoxia and hypercapnia (HCC) Active Problems:   AKI (acute kidney injury) (HCC)   Junctional bradycardia   Hyperkalemia  Acute kidney  injury on chronic kidney disease stage IV/pulmonary edema: -AKI seems to be resolving. -Serum creatinine is down to 3.34. -Patient was on CRRT for about 48 hours. -Continue to monitor input and input. -Avoid nephrotoxins. -Dose all medications assuming GFR of less than 10 mL/min. -Keep MAP greater than 65 mmHg. -Low threshold to outpatient for bilateral renal artery stenosis. -Hopefully, the pulmonary edema is not angina equivalent.  Troponins are flat. -Nephrology input is appreciated. -For now, patient is getting Lasix IV after assessment by the nephrology team. -Strict I's and O's.  Guarded long-term prognosis. -Low threshold to consult the palliative care team.  Acute on chronic hypoxic/hypercapnic respiratory failure: -Patient has been on oxygen for about 1 year (2 L/min via nasal cannula). -Etiology is likely multifactorial. -Respiratory failure is slowly improving. -Discussed need to comply with CPAP management and, treatment of OSA/likely undiagnosed OHS.  COPD: -Seems compensated. -Continue current regimen.  Paroxysmal atrial fibrillation: -Controlled heart rate. -Patient is on apixaban 2.5 Mg p.o. twice daily.  Anemia of chronic kidney disease: -Patient has received IV iron. -Patient is on Aranesp every 2 weeks.  Hypertension: -Continue to optimize. -Goal blood pressure should be less than 130/80 mmHg.  Diabetes mellitus:  -Continue sliding scale insulin coverage. -Continue to monitor blood sugar and adjust accordingly.  Hyperlipidemia: -Continue atorvastatin.  Possible community-acquired pneumonia: -Findings/events may be suggestive of pulmonary edema. -IV Zosyn has been discontinued.   DVT prophylaxis: Apixaban. Code Status: DO NOT RESUSCITATE. Family Communication: Husband. Disposition Plan: This will depend on hospital course.   Consultants:  Nephrology.  Procedures:  Patient was on CRRT.  Antimicrobials:  IV Zosyn has been  discontinued.   Subjective: No new complaints. Shortness of breath is improving. No fever or chills.  No chest pain.  Objective: Vitals:   10/30/22 0357 10/30/22 0731 10/30/22 0826 10/30/22 1112  BP: (!) 144/43 (!) 167/51  (!) 161/52  Pulse: (!) 42 (!) 49  (!) 44  Resp: 16 17  (!) 22  Temp: (!) 97.2 F (36.2 C) (!) 97.5 F (36.4 C)  97.6 F (36.4 C)  TempSrc: Oral Oral Oral Oral  SpO2: 95% 96%  94%  Weight:      Height:        Intake/Output Summary (Last 24 hours) at 10/30/2022 1122 Last data filed at 10/30/2022 0734 Gross per 24 hour  Intake 806.87 ml  Output 450 ml  Net 356.87 ml   Filed Weights   10/27/22 0500 10/28/22 0600 10/29/22 0600  Weight: 110.1 kg 103.9 kg 103.6 kg    Examination:  General exam: Appears calm and comfortable.  Patient is obese. Respiratory system: Decreased air entry.   Cardiovascular system: S1 & S2 heard Gastrointestinal system: Abdomen is morbidly obese, soft and nontender.   Central nervous system: Cannot alert.  Patient moves all extremities. Extremities: Bilateral lower extremity edema, is said to be improved (according to the husband)  Data Reviewed: I have personally reviewed following labs and imaging studies  CBC: Recent Labs  Lab 10/25/22 0730 10/25/22 0745 10/26/22 0207 10/26/22 0213 10/26/22 1722 10/27/22 0420 10/28/22 0500 10/29/22 1325 10/30/22 0017  WBC 18.3*  --  11.6*  --   --  13.6* 12.3* 13.3* 12.4*  NEUTROABS 15.2*  --   --   --   --   --   --   --   --   HGB 8.4*   < > 8.1*   < > 8.8* 7.6* 7.3* 8.2* 7.0*  HCT 28.6*   < > 27.3*   < > 26.0* 25.3* 24.4* 27.3* 23.3*  MCV 97.3  --  94.1  --   --  93.7 93.1 95.8 97.1  PLT 478*  --  422*  --   --  391 370 375 335   < > = values in this interval not displayed.   Basic Metabolic Panel: Recent Labs  Lab 10/26/22 0207 10/26/22 0213 10/27/22 0420 10/27/22 1526 10/28/22 0500 10/29/22 1325 10/30/22 0017  NA 135   < > 135 137 137 137 137  K 4.5   < > 4.3 4.2  4.1 4.2 4.3  CL 103   < > 100 102 99 98 96*  CO2 22   < > 25 25 26 25 26   GLUCOSE 73   < > 86 208* 100* 154* 176*  BUN 63*   < > 55* 57* 60* 73* 67*  CREATININE 2.73*   < > 2.70* 2.81* 3.04* 3.26* 3.34*  CALCIUM 7.6*   < > 8.0* 7.9* 8.3* 8.4* 8.5*  MG 2.1  --  2.1  --  2.1 2.1 2.0  PHOS 3.9   < > 4.5 4.5 5.2* 6.2* 6.3*   < > = values in this interval not displayed.   GFR: Estimated Creatinine Clearance: 15.8 mL/min (A) (by C-G formula based on SCr of 3.34 mg/dL (H)). Liver Function Tests: Recent Labs  Lab 10/26/22 0207 10/26/22 1610 10/27/22 0420 10/27/22 1526 10/28/22 0500 10/29/22 1325 10/30/22 0017  AST 14*  --   --   --   --   --   --   ALT 18  --   --   --   --   --   --   ALKPHOS 49  --   --   --   --   --   --  BILITOT 0.5  --   --   --   --   --   --   PROT 6.0*  --   --   --   --   --   --   ALBUMIN 2.2*   < > 2.0* 2.1* 2.1* 2.4* 2.0*   < > = values in this interval not displayed.   No results for input(s): "LIPASE", "AMYLASE" in the last 168 hours. No results for input(s): "AMMONIA" in the last 168 hours. Coagulation Profile: No results for input(s): "INR", "PROTIME" in the last 168 hours. Cardiac Enzymes: No results for input(s): "CKTOTAL", "CKMB", "CKMBINDEX", "TROPONINI" in the last 168 hours. BNP (last 3 results) Recent Labs    02/16/22 1008  PROBNP 699   HbA1C: No results for input(s): "HGBA1C" in the last 72 hours. CBG: Recent Labs  Lab 10/29/22 1117 10/29/22 1554 10/29/22 2102 10/30/22 0619 10/30/22 1110  GLUCAP 140* 145* 242* 166* 210*   Lipid Profile: No results for input(s): "CHOL", "HDL", "LDLCALC", "TRIG", "CHOLHDL", "LDLDIRECT" in the last 72 hours. Thyroid Function Tests: No results for input(s): "TSH", "T4TOTAL", "FREET4", "T3FREE", "THYROIDAB" in the last 72 hours. Anemia Panel: No results for input(s): "VITAMINB12", "FOLATE", "FERRITIN", "TIBC", "IRON", "RETICCTPCT" in the last 72 hours. Urine analysis:    Component Value  Date/Time   COLORURINE YELLOW 07/22/2021 0011   APPEARANCEUR CLOUDY (A) 07/22/2021 0011   LABSPEC 1.023 07/22/2021 0011   PHURINE 5.0 07/22/2021 0011   GLUCOSEU 50 (A) 07/22/2021 0011   GLUCOSEU NEGATIVE 12/09/2018 0944   HGBUR NEGATIVE 07/22/2021 0011   BILIRUBINUR NEGATIVE 07/22/2021 0011   KETONESUR NEGATIVE 07/22/2021 0011   PROTEINUR >=300 (A) 07/22/2021 0011   UROBILINOGEN 0.2 12/09/2018 0944   NITRITE NEGATIVE 07/22/2021 0011   LEUKOCYTESUR NEGATIVE 07/22/2021 0011   Sepsis Labs: @LABRCNTIP (procalcitonin:4,lacticidven:4)  ) Recent Results (from the past 240 hour(s))  Culture, blood (routine x 2)     Status: None   Collection Time: 10/25/22  8:24 AM   Specimen: BLOOD LEFT ARM  Result Value Ref Range Status   Specimen Description BLOOD LEFT ARM  Final   Special Requests   Final    BOTTLES DRAWN AEROBIC AND ANAEROBIC Blood Culture adequate volume   Culture   Final    NO GROWTH 5 DAYS Performed at [facility], [address] [place], [place] [address]    Report Status 10/30/2022 FINAL  Final  Culture, blood (routine x 2)     Status: None   Collection Time: 10/25/22  8:26 AM   Specimen: BLOOD  Result Value Ref Range Status   Specimen Description BLOOD SITE NOT SPECIFIED  Final   Special Requests   Final    BOTTLES DRAWN AEROBIC AND ANAEROBIC Blood Culture results may not be optimal due to an inadequate volume of blood received in culture bottles   Culture   Final    NO GROWTH 5 DAYS Performed at [facility], [address] [place], [place] [address]    Report Status 10/30/2022 FINAL  Final  MRSA Next Gen by PCR, Nasal     Status: None   Collection Time: 10/25/22 11:46 AM   Specimen: Nasal Mucosa; Nasal Swab  Result Value Ref Range Status   MRSA by PCR Next Gen NOT DETECTED NOT DETECTED Final    Comment: (NOTE) The GeneXpert MRSA Assay (FDA approved for NASAL specimens only), is one component of a comprehensive MRSA colonization  surveillance program. It is not intended to  diagnose MRSA infection nor to guide or monitor treatment for MRSA infections. Test performance is not FDA approved in patients less than 53 years old. Performed at [facility], [address], [place], [place] [address]          Radiology Studies: No results found.      Scheduled Meds:  amLODipine  10 mg Oral Daily   apixaban  2.5 mg Oral BID   atorvastatin  40 mg Oral Daily   bisacodyl  10 mg Rectal Daily   Or   bisacodyl  10 mg Oral Daily   darbepoetin (ARANESP) injection - NON-DIALYSIS  60 mcg Subcutaneous Q Wed-1800   guaiFENesin  600 mg Oral BID   insulin aspart  0-6 Units Subcutaneous TID WC   isosorbide mononitrate  30 mg Oral Daily   levothyroxine  200 mcg Oral Q0600   multivitamin  1 tablet Per Tube QHS   mouth rinse  15 mL Mouth Rinse 4 times per day   sodium chloride flush  3 mL Intravenous Q12H   Continuous Infusions:  sodium chloride Stopped (10/26/22 1934)     LOS: 5 days    Time spent: 55 minutes.    Berton Mount, MD  [facility] Pager #: [phone] 7PM-7AM contact night coverage as above

## 2022-10-30 NOTE — Evaluation (Signed)
Clinical/Bedside Swallow Evaluation Patient Details  Name: Kayla [name] MRN: [id number] Date of Birth: 1941-03-09  Today's Date: 10/30/2022 Time: SLP Start Time (ACUTE ONLY): 1029 SLP Stop Time (ACUTE ONLY): 1042 SLP Time Calculation (min) (ACUTE ONLY): 13 min  Past Medical History:  Past Medical History:  Diagnosis Date   Allergy    Anxiety    Arthritis    CAD (coronary artery disease)    Mild per remote cath in 2006, /    Nuclear, January, 2013, no significant abnormality,   Carotid artery disease (HCC)    Doppler, January, 2013, 0 39% RIC A., 40-59% LICA, stable   Cataract    CHF (congestive heart failure) (HCC)    Chronic kidney disease    Complication of anesthesia    wakes up "mean"   COPD 02/16/2007   Intolerant of Spiriva, tudorza Intolerant of Trelegy    COPD (chronic obstructive pulmonary disease) (HCC)    Coronary atherosclerosis 11/24/2008   Catheterization 2006, nonobstructive coronary disease   //   nuclear 2013, low risk     Diabetes mellitus, type 2 (HCC)    Diverticula of colon    DIVERTICULOSIS OF COLON 03/23/2007   Qualifier: Diagnosis of  By: Craige Cotta MD, Vineet     Dizziness    occasional   Dizzy spells 05/04/2011   DM (diabetes mellitus) (HCC) 03/23/2007   Qualifier: Diagnosis of  By: Craige Cotta MD, Vineet     Dyspnea    On exertion   Ejection fraction    Essential hypertension 02/16/2007   Qualifier: Diagnosis of  By: Earlene Plater CMA, Tammy     G E R D 03/23/2007   Qualifier: Diagnosis of  By: Craige Cotta MD, Vineet     Gall bladder disease 04/28/2016   Gastritis and gastroduodenitis    GERD (gastroesophageal reflux disease)    Goiter    hx of   Headache(784.0)    migraine   Hyperlipidemia    Hypertension    Hypothyroidism    HYPOTHYROIDISM, POSTSURGICAL 06/04/2008   Qualifier: Diagnosis of  By: Everardo All MD, Sean A    Left ventricular hypertrophy    Leg edema    occasional swelling   Leg swelling 12/09/2018   Myofascial pain syndrome, cervical 01/06/2018    Obesity    OBESITY 11/24/2008   Qualifier: Diagnosis of  By: Vikki Ports     OBSTRUCTIVE SLEEP APNEA 02/16/2007   Auto CPAP 09/03/13 to 10/02/13 >> used on 30 of 30 nights with average 6 hrs and 3 min.  Average AHI is 3.1 with median CPAP 5 cm H2O and 95 th percentile CPAP 6 cm H2O.     Occipital neuralgia of right side 01/06/2018   OSA (obstructive sleep apnea)    cpap setting of 12   Pain in joint of right hip 01/21/2018   Palpitations 01/21/2011   48 hour Holter, 2015, scattered PACs and PVCs.    Paroxysmal atrial fibrillation (HCC)    Pneumonia    PONV (postoperative nausea and vomiting)    severe after every surgery   Pulmonary hypertension, secondary    RHINITIS 07/21/2010        RUQ abdominal pain    S/P left TKA 07/27/2011   Sinus node dysfunction (HCC) 11/08/2015   Sleep apnea    Spinal stenosis of cervical region 11/07/2014   Urinary frequency 12/09/2018   UTI (urinary tract infection) 08/15/2019   per pt report   VENTRICULAR HYPERTROPHY, LEFT 11/24/2008   Qualifier: Diagnosis of  By:  Vikki Ports     Past Surgical History:  Past Surgical History:  Procedure Laterality Date   ABDOMINAL HYSTERECTOMY     with appendectomy and colon polypectomy   AMPUTATION TOE Left 12/15/2019   Procedure: AMPUTATION  LEFT GREAT TOE;  Surgeon: Park Liter, DPM;  Location: [facility];  Service: Podiatry;  Laterality: Left;   AMPUTATION TOE Left 01/22/2022   Procedure: AMPUTATION TOE 4th digit;  Surgeon: Candelaria Stagers, DPM;  Location: [facility];  Service: Podiatry;  Laterality: Left;   AMPUTATION TOE Left 09/04/2022   Procedure: LEFT BIG TOE AMPUTAION;  Surgeon: Vivi Barrack, DPM;  Location: [facility];  Service: Podiatry;  Laterality: Left;   ANTERIOR CERVICAL DECOMP/DISCECTOMY FUSION N/A 11/07/2014   Procedure: Anterior Cervical diskectomy and fusion Cervical four-five, Cervical five-six, Cervical six-seven;  Surgeon: Donalee Citrin, MD;  Location: [facility];  Service: Neurosurgery;   Laterality: N/A;   APPENDECTOMY     ARTERY BIOPSY Right 02/16/2017   Procedure: BIOPSY TEMPORAL ARTERY;  Surgeon: Renford Dills, MD;  Location: [facility];  Service: Vascular;  Laterality: Right;   ARTERY BIOPSY Left 04/16/2020   Procedure: BIOPSY TEMPORAL ARTERY;  Surgeon: Renford Dills, MD;  Location: [facility];  Service: Vascular;  Laterality: Left;  LEFT TEMPLE   BACK SURGERY     CARDIAC CATHETERIZATION     CATARACT EXTRACTION W/ INTRAOCULAR LENS  IMPLANT, BILATERAL Bilateral    CHOLECYSTECTOMY N/A 04/29/2016   Procedure: LAPAROSCOPIC CHOLECYSTECTOMY WITH INTRAOPERATIVE CHOLANGIOGRAM;  Surgeon: Ovidio Kin, MD;  Location: [facility];  Service: General;  Laterality: N/A;   EP IMPLANTABLE DEVICE N/A 11/08/2015   MRI COMPATABLE -- Procedure: Pacemaker Implant;  Surgeon: Duke Salvia, MD;  [facility];  Service: Cardiovascular;  Laterality: N/A;   INCISION AND DRAINAGE ABSCESS Right 05/30/2021   Procedure: IRRIGATION AND DEBRIDEMENT OF PREPATELLA BURSA;  Surgeon: Durene Romans, MD;  Location: [facility];  Service: Orthopedics;  Laterality: Right;  60 wants standard knee draping and pulse lavage   INCISION AND DRAINAGE ABSCESS Right 07/22/2021   Procedure: INCISION AND DRAINAGE ABSCESS RIGHT KNEE;  Surgeon: Durene Romans, MD;  Location: [facility];  Service: Orthopedics;  Laterality: Right;   INSERT / REPLACE / REMOVE PACEMAKER  2017   Dr. Clide [facility])   IR FLUORO GUIDE CV LINE RIGHT  07/25/2021   IR REMOVAL TUN CV CATH W/O FL  09/04/2021   IR US GUIDE VASC ACCESS RIGHT  07/25/2021   JOINT REPLACEMENT     LEAD EXTRACTION N/A 09/08/2022   Procedure: LEAD EXTRACTION;  Surgeon: Marinus Maw, MD;  [facility];  Service: Cardiovascular;  Laterality: N/A;   LEAD EXTRACTION N/A 09/11/2022   Procedure: LEAD EXTRACTION;  Surgeon: Marinus Maw, MD;  [facility];  Service: Cardiovascular;  Laterality: N/A;   LUMBAR DISC SURGERY  1990's   x 2   TEE  WITHOUT CARDIOVERSION N/A 09/07/2022   Procedure: TRANSESOPHAGEAL ECHOCARDIOGRAM;  Surgeon: Christell Constant, MD;  [facility];  Service: Cardiovascular;  Laterality: N/A;   THYROIDECTOMY  2011   TOTAL KNEE ARTHROPLASTY  07/27/2011   Procedure: TOTAL KNEE ARTHROPLASTY;  Surgeon: Shelda Pal, MD;  Location: [facility];  Service: Orthopedics;  Laterality: Left;   UPPER ESOPHAGEAL ENDOSCOPIC ULTRASOUND (EUS) N/A 05/14/2016   Procedure: UPPER ESOPHAGEAL ENDOSCOPIC ULTRASOUND (EUS);  Surgeon: Rachael Fee, MD;  Location: [facility];  Service: Endoscopy;  Laterality: N/A;  radial  only-will be done mod if needed   HPI:  82 y.o. female admitted with respiratory distress. History of obesity, DM II, HTN, HLD, CAD, CHF, PAF on Eliquis, SSS s/p PPM, CKD, Anemia, COPD, GERD, hypothyroidism, chronic venous insufficiency, s/p left first ray amputation (5/3) with active osteomyelitis. CXR Improved bibasilar opacities. BSE 6/25 without s/s aspiration in setting of respiratory distress, reg/thin recommended. ST re-ordered with note in the chart of "feeling like food is stuck in her throat and coughing with liquids."    Assessment / Plan / Recommendation  Clinical Impression  Pt assessed for swallow and presents similar to initial assessment 6/25. Pt initially denied difficulty swallowing then stated "my throat is sore". She felt like the pill "got stuck this morning." Discussed with pt and RN who crushed her pills after initial pill whole in puree. Pt has history of GERD and COPD and has mild dyspnea during assessment which pt is aware of and takes rest breaks. There were no signs of aspiration during straw sips of thin although with COPD there can be decreased sensation. She had one delayed cough at the end of session and was able to masticate cracker without residue. She also belched several times. Pt may be having globus sensation due to sore throat. Do not suspect she is aspirating at this  time. She is aware of need to sit upright although needs encouragement and independently takes rest breaks when needed. CXR 6/25 showed improving opacities. Recommend continue regular diet (states she does not like hospital food and said family can bring food from home if desired), thin liquids and crush pills. No further ST is needed. SLP Visit Diagnosis: Dysphagia, unspecified (R13.10)    Aspiration Risk  Mild aspiration risk    Diet Recommendation Regular;Thin liquid    Liquid Administration via: Cup;Straw Medication Administration: Crushed with puree Supervision: Patient able to self feed Compensations: Slow rate;Small sips/bites (rest breaks) Postural Changes: Seated upright at 90 degrees    Other  Recommendations Oral Care Recommendations: Oral care BID    Recommendations for follow up therapy are one component of a multi-disciplinary discharge planning process, led by the attending physician.  Recommendations may be updated based on patient status, additional functional criteria and insurance authorization.  Follow up Recommendations No SLP follow up      Assistance Recommended at Discharge    Functional Status Assessment Patient has not had a recent decline in their functional status  Frequency and Duration            Prognosis        Swallow Study   General HPI: 82 y.o. female admitted with respiratory distress. History of obesity, DM II, HTN, HLD, CAD, CHF, PAF on Eliquis, SSS s/p PPM, CKD, Anemia, COPD, GERD, hypothyroidism, chronic venous insufficiency, s/p left first ray amputation (5/3) with active osteomyelitis. CXR Improved bibasilar opacities. BSE 6/25 without s/s aspiration in setting of respiratory distress, reg/thin recommended. ST re-ordered with note in the chart of "feeling like food is stuck in her throat and coughing with liquids." Type of Study: Bedside Swallow Evaluation Previous Swallow Assessment:  (see above) Diet Prior to this Study: Regular;Thin  liquids (Level 0) Temperature Spikes Noted: No Respiratory Status: Nasal cannula History of Recent Intubation: No Behavior/Cognition: Alert;Cooperative;Pleasant mood Oral Cavity Assessment: Within Functional Limits Oral Care Completed by SLP: No Oral Cavity - Dentition: Dentures, top;Dentures, bottom Vision: Functional for self-feeding Self-Feeding Abilities: Able to feed self Patient Positioning: Upright in bed Baseline Vocal Quality: Normal Volitional Cough: Strong  Volitional Swallow: Able to elicit    Oral/Motor/Sensory Function Overall Oral Motor/Sensory Function: Within functional limits   Ice Chips Ice chips: Not tested   Thin Liquid Thin Liquid: Within functional limits Presentation: Cup;Straw Oral Phase Functional Implications:  (none) Pharyngeal  Phase Impairments:  (none)    Nectar Thick Nectar Thick Liquid: Not tested   Honey Thick Honey Thick Liquid: Not tested   Puree Puree: Within functional limits   Solid     Solid: Within functional limits      Royce Macadamia 10/30/2022,11:09 AM

## 2022-10-30 NOTE — Consult Note (Signed)
   [facility] Inpatient Consult   10/30/2022  SHAGUN COOMER 06/25/1940 [id number]  [facility] [[facility]]  Accountable Care Organization [ACO] Patient: [facility]  Primary Care Provider:  Ailene Ravel, MD [facility], [facility]  Patient screened for less than 30 days readmission hospitalization with noted extreme high risk score for unplanned readmission risk 5 day length of stay transitioned out of ICU unit 10/29/22 and  to assess for potential [facility]  [[facility]] Care Management service needs for post hospital transition for care coordination.  Review of patient's electronic medical record reveals patient in encounters with [facility] for Care Coordination and has decline follow up noted in the past 4 months but following as needs may have changed for care coordination post hospital.   Plan:  Continue to follow progress and disposition to assess for post hospital community care coordination/management needs.  Referral request for community care coordination: will follow for recommendations and pending disposition and as needs may have changed  Of note, [facility] Care Management/Population Health does not replace or interfere with any arrangements made by the Inpatient Transition of Care team.  For questions contact:   Charlesetta Shanks, RN BSN CCM [facility]  [phone] business mobile phone Toll free office [phone]  *Concierge Line  [phone] Fax number: [phone] [email or web address]

## 2022-10-30 NOTE — Progress Notes (Signed)
Mobility Specialist: Progress Note   10/30/22 1055  Mobility  Activity Refused mobility   Pt refused mobility stating she is tired and would like to take a nap. Pt also c/o feeling SOB from getting back in bed recently. Will f/u as able.   Kayla [name] Mobility Specialist Please contact via SecureChat or Rehab office at [phone]

## 2022-10-30 NOTE — Progress Notes (Signed)
[facility] Progress Note    Assessment/ Plan:   Acute kidney injury on CKD stage IV likely due to CHF exacerbation/fluid overload/bradycardia leading to reduced renal perfusion.  B/L Cr around 2, followed by me as an outpatient. She had hyperkalemia, anasarca and did not respond with IV diuretics.  Also had features of uremic encephalopathy including tremor, AMS. -temp HD catheter placed on 6/23, started CRRT 6/23. CRRT off on 6/24 given access issues/alarms. RIJ temp line removed 6/26 -Cr stable at 3.3 today. Will give one dose of lasix 60mg  IV today given that she does have some evidence of hypervolemia on exam. Plan is spot diuresis for now -no indications for HD/CRRT at this time-volume status stable and not exhibiting any uremic signs/symptoms. Side note: discussed with patient and husband and she would want to pursue dialysis in the long term. We did discuss this in the office as well and she is interested in home therapies down the road if/when indicated. Time will tell where her kidney function will settle out to -daily labs, strict I/O   Hyperkalemia: Received medical treatment in the ER.  Improved with CRRT (currently off). On lasix. K WNL now   Bradycardia: Seen by cardiologist.  Off dopamine gtt, HR stable   Acute on chronic respiratory failure/respiratory distress with hypoxia: lasix as above. CXR 6/25 revealed improvement in opacities. On 3L [place] at baseline   Sepsis due to possible pneumonia: On antibiotics per primary team.  Hyponatremia, mild: likely secondary to AKI and hypervolemia. WNL now  Anemia: transfuse for Hgb <7. ESA start 6/26. Feraheme x 1 dose today  Subjective:   Patient seen and examined bedside. Husband at bedside.  Out of ICU. Feels better but still some SOB.   Objective:   BP (!) 167/51 (BP Location: Left Wrist)   Pulse (!) 49   Temp (!) 97.5 F (36.4 C) (Oral)   Resp 17   Ht 5\' 5"  (1.651 m)   Wt 103.6 kg   SpO2 96%   BMI 38.01  kg/m   Intake/Output Summary (Last 24 hours) at 10/30/2022 9147 Last data filed at 10/30/2022 0502 Gross per 24 hour  Intake 566.87 ml  Output 450 ml  Net 116.87 ml   Weight change:   Physical Exam: Gen: NAD, sitting up in recliner eating breakfast CVS: RRR Resp: decreased breath sounds bibasilar with left>rt fine rales, on [place], no increased WOB Abd: soft, nt/nd Ext: trace pitting edema b/l Les Neuro: awake, alert, no asterixis Dialysis access: none  Imaging: No results found.  Labs: BMET Recent Labs  Lab 10/26/22 0207 10/26/22 0213 10/26/22 1610 10/26/22 1722 10/27/22 0420 10/27/22 1526 10/28/22 0500 10/29/22 1325 10/30/22 0017  NA 135   < > 133* 134* 135 137 137 137 137  K 4.5   < > 4.4 4.9 4.3 4.2 4.1 4.2 4.3  CL 103  --  100  --  100 102 99 98 96*  CO2 22  --  26  --  25 25 26 25 26   GLUCOSE 73  --  167*  --  86 208* 100* 154* 176*  BUN 63*  --  47*  --  55* 57* 60* 73* 67*  CREATININE 2.73*  --  2.34*  --  2.70* 2.81* 3.04* 3.26* 3.34*  CALCIUM 7.6*  --  7.8*  --  8.0* 7.9* 8.3* 8.4* 8.5*  PHOS 3.9  --  3.7  --  4.5 4.5 5.2* 6.2* 6.3*   < > = values  in this interval not displayed.   CBC Recent Labs  Lab 10/25/22 0730 10/25/22 0745 10/27/22 0420 10/28/22 0500 10/29/22 1325 10/30/22 0017  WBC 18.3*   < > 13.6* 12.3* 13.3* 12.4*  NEUTROABS 15.2*  --   --   --   --   --   HGB 8.4*   < > 7.6* 7.3* 8.2* 7.0*  HCT 28.6*   < > 25.3* 24.4* 27.3* 23.3*  MCV 97.3   < > 93.7 93.1 95.8 97.1  PLT 478*   < > 391 370 375 335   < > = values in this interval not displayed.    Medications:     amLODipine  10 mg Oral Daily   apixaban  2.5 mg Oral BID   atorvastatin  40 mg Oral Daily   bisacodyl  10 mg Rectal Daily   Or   bisacodyl  10 mg Oral Daily   darbepoetin (ARANESP) injection - NON-DIALYSIS  60 mcg Subcutaneous Q Wed-1800   furosemide  60 mg Intravenous Once   guaiFENesin  600 mg Oral BID   insulin aspart  0-6 Units Subcutaneous TID WC   isosorbide  mononitrate  30 mg Oral Daily   levothyroxine  200 mcg Oral Q0600   multivitamin  1 tablet Per Tube QHS   mouth rinse  15 mL Mouth Rinse 4 times per day   sodium chloride flush  3 mL Intravenous Q12H      Anthony Sar, MD [facility] 10/30/2022, 8:12 AM

## 2022-10-30 NOTE — Progress Notes (Signed)
Patient c/o pills, applesauce, graham cracker feeling like it is stuck in her throat.  Encouraged pt to swallow twice and tuck chin.  Patient denied any relief with technique.  Patient noted to cough with some liquid.

## 2022-10-30 NOTE — Care Management Important Message (Signed)
Important Message  Patient Details  Name: Kayla [name] MRN: [id number] Date of Birth: 02/24/1941   Medicare Important Message Given:  Yes     Masiya Claassen Stefan Church 10/30/2022, 1:48 PM

## 2022-10-31 ENCOUNTER — Inpatient Hospital Stay (HOSPITAL_COMMUNITY): Payer: Medicare HMO

## 2022-10-31 DIAGNOSIS — J9602 Acute respiratory failure with hypercapnia: Secondary | ICD-10-CM | POA: Diagnosis not present

## 2022-10-31 DIAGNOSIS — J9601 Acute respiratory failure with hypoxia: Secondary | ICD-10-CM | POA: Diagnosis not present

## 2022-10-31 LAB — GLUCOSE, CAPILLARY
Glucose-Capillary: 150 mg/dL — ABNORMAL HIGH (ref 70–99)
Glucose-Capillary: 169 mg/dL — ABNORMAL HIGH (ref 70–99)
Glucose-Capillary: 185 mg/dL — ABNORMAL HIGH (ref 70–99)
Glucose-Capillary: 161 mg/dL — ABNORMAL HIGH (ref 70–99)

## 2022-10-31 LAB — RENAL FUNCTION PANEL
Albumin: 2.4 g/dL — ABNORMAL LOW (ref 3.5–5.0)
Anion gap: 13 (ref 5–15)
BUN: 67 mg/dL — ABNORMAL HIGH (ref 8–23)
CO2: 25 mmol/L (ref 22–32)
Calcium: 8.7 mg/dL — ABNORMAL LOW (ref 8.9–10.3)
Chloride: 99 mmol/L (ref 98–111)
Creatinine, Ser: 3.18 mg/dL — ABNORMAL HIGH (ref 0.44–1.00)
GFR, Estimated: 14 mL/min — ABNORMAL LOW (ref >60)
Glucose, Bld: 163 mg/dL — ABNORMAL HIGH (ref 70–99)
Phosphorus: 5.7 mg/dL — ABNORMAL HIGH (ref 2.5–4.6)
Potassium: 4.2 mmol/L (ref 3.5–5.1)
Sodium: 137 mmol/L (ref 135–145)

## 2022-10-31 LAB — CBC
HCT: 25 % — ABNORMAL LOW (ref 36.0–46.0)
Hemoglobin: 7.5 g/dL — ABNORMAL LOW (ref 12.0–15.0)
MCH: 28.5 pg (ref 26.0–34.0)
MCHC: 30 g/dL (ref 30.0–36.0)
MCV: 95.1 fL (ref 80.0–100.0)
Platelets: 365 10*3/uL (ref 150–400)
RBC: 2.63 MIL/uL — ABNORMAL LOW (ref 3.87–5.11)
RDW: 17.2 % — ABNORMAL HIGH (ref 11.5–15.5)
WBC: 11.9 10*3/uL — ABNORMAL HIGH (ref 4.0–10.5)
nRBC: 0 % (ref 0.0–0.2)

## 2022-10-31 LAB — MAGNESIUM: Magnesium: 2.1 mg/dL (ref 1.7–2.4)

## 2022-10-31 MED ORDER — FUROSEMIDE 10 MG/ML IJ SOLN
80.0000 mg | Freq: Two times a day (BID) | INTRAMUSCULAR | Status: DC
  Administered 2022-10-31 – 2022-11-02 (×4): 80 mg via INTRAVENOUS
  Filled 2022-10-31 (×4): qty 8

## 2022-10-31 MED ORDER — LORAZEPAM 0.5 MG PO TABS
0.5000 mg | ORAL_TABLET | Freq: Once | ORAL | Status: AC
  Administered 2022-10-31: 0.5 mg via ORAL
  Filled 2022-10-31: qty 1

## 2022-10-31 NOTE — Plan of Care (Signed)
CXR personally reviewed. Vascular congestion present. Will start lasix 80mg  IV BID for now. Monitor strict I/O.  Anthony Sar, MD [facility]

## 2022-10-31 NOTE — Progress Notes (Signed)
Occupational Therapy Treatment Patient Details Name: Kayla [name] MRN: [id number] DOB: 1940/07/12 Today's Date: 10/31/2022   History of present illness Pt is 82 year old female brought to the ED 10/25/22 for SoB and AMS. Found to be in respiratory distress, HR 30s. ABG pH 7.1 with high pCO2 and low bicarbonate, Alo noted to have AKI. CRRT 6/23-6/24  PMH: obesity, type 2 diabetes, hypertension, hyperlipidemia, CAD, congestive heart failure, paroxysmal atrial fibrillation (on Eliquis), sick sinus syndrome status post pacemaker, CKD, anemia, COPD, GERD, hypothyroidism, chronic venous insufficiency, status post left first ray amputation (09/04/2022) with active osteomyelitis   OT comments  Pt. Seen for skilled OT treatment.  Husband present for session, son waited in the hall.  Pt. Able to complete bed mobility with S.  Min guard for transfer to recliner.  Education/demo/return demo for PLB.  Pt. Receptive and able to incorporate during mobility with cues required for consistently closing lips.  Declined LB dressing attempts but states "I could at home I can't now". Will plan for A/E next session for increasing safety and independence with LB ADLS.     Recommendations for follow up therapy are one component of a multi-disciplinary discharge planning process, led by the attending physician.  Recommendations may be updated based on patient status, additional functional criteria and insurance authorization.    Assistance Recommended at Discharge Frequent or constant Supervision/Assistance  Patient can return home with the following  A little help with walking and/or transfers;Direct supervision/assist for medications management;Direct supervision/assist for financial management;Assist for transportation;Assistance with cooking/housework;A little help with bathing/dressing/bathroom   Equipment Recommendations  None recommended by OT    Recommendations for Other Services      Precautions /  Restrictions Precautions Precautions: Fall       Mobility Bed Mobility Overal bed mobility: Needs Assistance Bed Mobility: Rolling, Sidelying to Sit Rolling: Supervision Sidelying to sit: Supervision       General bed mobility comments: hob elevated, pt. able to use bed rails and transition into sitting with no physical assistance    Transfers Overall transfer level: Needs assistance Equipment used: Rolling walker (2 wheels) Transfers: Sit to/from Stand, Bed to chair/wheelchair/BSC Sit to Stand: Min guard     Step pivot transfers: Min guard           Balance                                           ADL either performed or assessed with clinical judgement   ADL Overall ADL's : Needs assistance/impaired                       Lower Body Dressing Details (indicate cue type and reason): pt. reports "i coult do it before but not now" encouraged her to attempt so we could look at any needed modifications and she refused to demo Toilet Transfer: Min guard;Minimal assistance;Ambulation;Rolling walker (2 wheels) Toilet Transfer Details (indicate cue type and reason): observed during eob to recliner with rw         Functional mobility during ADLs: Supervision/safety;Rolling walker (2 wheels) General ADL Comments: focus on PLB for breathing and anxiety management. pt. able to return demo with encouragement. cues to remember to close her mouth during the inhale portion.  able to maintain o2 levels during all movement and transfers.  education on rest breaks from supine to sit,  sit/stand with incorporation of PLB also during these times.    Extremity/Trunk Assessment              Vision       Perception     Praxis      Cognition Arousal/Alertness: Awake/alert Behavior During Therapy: Agitated, WFL for tasks assessed/performed                                   General Comments: notable agitation when therapist asst.  attempting to answer questions or explain things that the pt. or spouse has inquired about        Exercises      Shoulder Instructions       General Comments      Pertinent Vitals/ Pain       Pain Assessment Pain Assessment: No/denies pain  Home Living                                          Prior Functioning/Environment              Frequency  Min 1X/week        Progress Toward Goals  OT Goals(current goals can now be found in the care plan section)  Progress towards OT goals: Progressing toward goals     Plan Discharge plan remains appropriate    Co-evaluation                 AM-PAC OT "6 Clicks" Daily Activity     Outcome Measure   Help from another person eating meals?: None Help from another person taking care of personal grooming?: None Help from another person toileting, which includes using toliet, bedpan, or urinal?: A Little Help from another person bathing (including washing, rinsing, drying)?: A Lot Help from another person to put on and taking off regular upper body clothing?: A Little Help from another person to put on and taking off regular lower body clothing?: A Lot 6 Click Score: 18    End of Session Equipment Utilized During Treatment: Rolling walker (2 wheels);Oxygen;Gait belt  OT Visit Diagnosis: Unsteadiness on feet (R26.81);Muscle weakness (generalized) (M62.81);Other symptoms and signs involving cognitive function   Activity Tolerance Patient tolerated treatment well   Patient Left in chair;with call bell/phone within reach;with family/visitor present   Nurse Communication Other (comment) (reviewed with rn pts. spouse request to have her review chest xray information with them)        Time: 1146-1209 OT Time Calculation (min): 23 min  Charges: OT General Charges $OT Visit: 1 Visit OT Treatments $Self Care/Home Management : 23-37 mins  Boneta Lucks, COTA/L Acute Rehabilitation [phone]   Alessandra Bevels Lorraine-COTA/L 10/31/2022, 12:23 PM

## 2022-10-31 NOTE — Progress Notes (Signed)
[facility] Progress Note    Assessment/ Plan:   Acute kidney injury on CKD stage IV likely due to CHF exacerbation/fluid overload/bradycardia leading to reduced renal perfusion.  B/L Cr around 2, followed by me as an outpatient. She had hyperkalemia, anasarca and did not respond with IV diuretics.  Also had features of uremic encephalopathy including tremor, AMS. -temp HD catheter placed on 6/23, started CRRT 6/23. CRRT off on 6/24 given access issues/alarms. RIJ temp line removed 6/26 -Cr down to 3.18 today. Will hold on lasix, I believe her current respiratory issue is secondary to COPD, volume status is stable, on exam her lung findings revealed diffuse wheezes and rhonchi. Plan is spot diuresis for now. Will see what her CXR shows -no indications for HD/CRRT at this time-volume status stable and not exhibiting any uremic signs/symptoms. Side note: discussed with patient and husband and she would want to pursue dialysis in the long term. We did discuss this in the office as well and she is interested in home therapies down the road if/when indicated. Time will tell where her kidney function will settle out to -daily labs, strict I/O   Hyperkalemia: Received medical treatment in the ER.  Improved with CRRT (currently off). On lasix. K WNL now   Bradycardia: Seen by cardiologist.  Off dopamine gtt, HR stable   Acute on chronic respiratory failure/respiratory distress with hypoxia: lasix as above. CXR 6/25 revealed improvement in opacities. On 3L [place] at baseline -worsening SOB seems to be more so secondary to COPD exacerbation, her volume status is overall stable therefore will hold on lasix but she may very well need steroids/treatment for this--will defer to primary service for this -will order CXR   Sepsis due to possible pneumonia: On antibiotics per primary team.  Hyponatremia, mild: likely secondary to AKI and hypervolemia. WNL now  Anemia: transfuse for Hgb <7. ESA  start 6/26. Feraheme x 1 dose 6/28  Discussed with husband over the phone just now.  Subjective:   Patient seen and examined bedside. RN at bedside. Patient reports that she has been more SOB, she feels like something is in her throat. RN sent message to primary.   Objective:   BP (!) 167/51 (BP Location: Left Wrist)   Pulse (!) 47   Temp 97.7 F (36.5 C) (Oral)   Resp 17   Ht 5\' 5"  (1.651 m)   Wt 103.6 kg   SpO2 97%   BMI 38.01 kg/m   Intake/Output Summary (Last 24 hours) at 10/31/2022 5643 Last data filed at 10/30/2022 2312 Gross per 24 hour  Intake 457 ml  Output 725 ml  Net -268 ml   Weight change:   Physical Exam: Gen: anxious appearing CVS: RRR Resp: diffuse wheezing and rhonchi, no crackles heard, on [place], increased WOB/unable to speak in full sentences Abd: soft, nt/nd Ext: very trace pitting edema b/l Les Neuro: awake, alert, no asterixis Dialysis access: none  Imaging: No results found.  Labs: BMET Recent Labs  Lab 10/26/22 1610 10/26/22 1722 10/27/22 0420 10/27/22 1526 10/28/22 0500 10/29/22 1325 10/30/22 0017 10/31/22 0016  NA 133* 134* 135 137 137 137 137 137  K 4.4 4.9 4.3 4.2 4.1 4.2 4.3 4.2  CL 100  --  100 102 99 98 96* 99  CO2 26  --  25 25 26 25 26 25   GLUCOSE 167*  --  86 208* 100* 154* 176* 163*  BUN 47*  --  55* 57* 60* 73* 67* 67*  CREATININE 2.34*  --  2.70* 2.81* 3.04* 3.26* 3.34* 3.18*  CALCIUM 7.8*  --  8.0* 7.9* 8.3* 8.4* 8.5* 8.7*  PHOS 3.7  --  4.5 4.5 5.2* 6.2* 6.3* 5.7*   CBC Recent Labs  Lab 10/25/22 0730 10/25/22 0745 10/28/22 0500 10/29/22 1325 10/30/22 0017 10/31/22 0016  WBC 18.3*   < > 12.3* 13.3* 12.4* 11.9*  NEUTROABS 15.2*  --   --   --   --   --   HGB 8.4*   < > 7.3* 8.2* 7.0* 7.5*  HCT 28.6*   < > 24.4* 27.3* 23.3* 25.0*  MCV 97.3   < > 93.1 95.8 97.1 95.1  PLT 478*   < > 370 375 335 365   < > = values in this interval not displayed.    Medications:     amLODipine  10 mg Oral Daily   apixaban   2.5 mg Oral BID   atorvastatin  40 mg Oral Daily   bisacodyl  10 mg Rectal Daily   Or   bisacodyl  10 mg Oral Daily   darbepoetin (ARANESP) injection - NON-DIALYSIS  60 mcg Subcutaneous Q Wed-1800   guaiFENesin  600 mg Oral BID   insulin aspart  0-6 Units Subcutaneous TID WC   isosorbide mononitrate  30 mg Oral Daily   levothyroxine  200 mcg Oral Q0600   multivitamin  1 tablet Per Tube QHS   mouth rinse  15 mL Mouth Rinse 4 times per day   sodium chloride flush  3 mL Intravenous Q12H      Anthony Sar, MD [facility] 10/31/2022, 8:11 AM

## 2022-10-31 NOTE — Progress Notes (Signed)
PROGRESS NOTE    Kayla [name]  WUJ:[id number] DOB: 03/28/1941 DOA: 10/25/2022 PCP: Ailene Ravel, MD  Outpatient Specialists:     Brief Narrative:  Patient is an 82 year old Caucasian female, significantly obese, with recent history of MRSA bacteremia/infective endocarditis for which patient was on antibiotics for a long time.  Other medical history includes coronary artery disease, carotid artery disease, chronic kidney disease stage IV, COPD, diabetes mellitus type 2, hypertension, hyperlipidemia, hypothyroidism and documented congestive heart failure.  Recent echocardiograms have revealed normal left ventricular ejection fraction, without mention of diastolic dysfunction.  Patient was admitted with pulmonary edema, acute kidney injury on chronic kidney disease stage IV with oliguria versus anuria, requiring CRRT for about 48 hours.  Improvement in renal function is noted.  Patient continues to make urine.  Hypercapnic respiratory failure was also noted on presentation.  Patient has been on home oxygen for but a year.  Patient was admitted to ICU, underwent CRRT, treated for respiratory failure and pulmonary edema.  Patient has been transferred to the care of the hospitalist team today, 10/30/2022.  10/30/2022: Patient seen alongside patient's husband.  Patient seems to be improving.  However, long-term prognosis remains guarded.  Troponins were flat.  I have not visualized workup for bilateral renal artery stenosis.  Nephrology and pulmonary input is highly appreciated.  BMP done earlier today revealed sodium of 137, potassium of 4.3, chloride 96, CO2 26, BUN of 67 with serum creatinine of 3.34.  Patient's baseline serum creatinine was 2.1.  Serum creatinine peaked at 5.06.  10/31/2022: Patient seen.  Patient continues to improve.  Serum creatinine today is 3.18.  Patient continues to make urine.  Phosphorus is down to 5.7.  Assessment & Plan:   Principal Problem:   Acute respiratory  failure with hypoxia and hypercapnia (HCC) Active Problems:   AKI (acute kidney injury) (HCC)   Junctional bradycardia   Hyperkalemia  Acute kidney injury on chronic kidney disease stage IV/pulmonary edema: -AKI seems to be resolving. -Serum creatinine is down to 3.34. -Patient was on CRRT for about 48 hours. -Continue to monitor input and input. -Avoid nephrotoxins. -Dose all medications assuming GFR of less than 10 mL/min. -Keep MAP greater than 65 mmHg. -Low threshold to outpatient for bilateral renal artery stenosis. -Hopefully, the pulmonary edema is not angina equivalent.  Troponins are flat. -Nephrology input is appreciated. -For now, patient is getting Lasix IV after assessment by the nephrology team. -Strict I's and O's. 10/31/2022: AKI continues to improve.  Patient is done on IV Lasix 80 Mg twice daily.  Renal artery Doppler ultrasound ordered.  Acute on chronic hypoxic/hypercapnic respiratory failure: -Patient has been on oxygen for about 1 year (2 L/min via nasal cannula). -Etiology is likely multifactorial. -Respiratory failure is slowly improving. -Discussed need to comply with CPAP management and, treatment of OSA/likely undiagnosed OHS. 10/31/2022: Stable.  COPD: -Seems compensated. -Continue current regimen.  Paroxysmal atrial fibrillation: -Controlled heart rate. -Patient is on apixaban 2.5 Mg p.o. twice daily.  Anemia of chronic kidney disease: -Patient has received IV iron. -Patient is on Aranesp every 2 weeks.  Hypertension: -Continue to optimize. -Goal blood pressure should be less than 130/80 mmHg.  Diabetes mellitus:  -Continue sliding scale insulin coverage. -Continue to monitor blood sugar and adjust accordingly.  Hyperlipidemia: -Continue atorvastatin.  Possible community-acquired pneumonia: -Findings/events may be suggestive of pulmonary edema. -IV Zosyn has been discontinued.   DVT prophylaxis: Apixaban. Code Status: DO NOT  RESUSCITATE. Family Communication: Husband. Disposition Plan: This will  depend on hospital course.   Consultants:  Nephrology.  Procedures:  Patient was on CRRT.  Antimicrobials:  IV Zosyn has been discontinued.   Subjective: No new complaints. Shortness of breath is improving. No fever or chills. No chest pain.  Objective: Vitals:   10/30/22 2312 10/31/22 0341 10/31/22 0954 10/31/22 1115  BP: (!) 169/56 (!) 167/51 (!) 172/46 (!) 144/64  Pulse: (!) 48 (!) 47  (!) 55  Resp: 18 17  14   Temp: 98.2 F (36.8 C) 97.7 F (36.5 C)  97.7 F (36.5 C)  TempSrc: Oral Oral  Oral  SpO2: 95% 97%  92%  Weight:      Height:        Intake/Output Summary (Last 24 hours) at 10/31/2022 1638 Last data filed at 10/31/2022 1442 Gross per 24 hour  Intake 400 ml  Output 1225 ml  Net -825 ml    Filed Weights   10/27/22 0500 10/28/22 0600 10/29/22 0600  Weight: 110.1 kg 103.9 kg 103.6 kg    Examination:  General exam: Appears calm and comfortable.  Patient is obese. Respiratory system: Decreased air entry.   Cardiovascular system: S1 & S2 heard Gastrointestinal system: Abdomen is morbidly obese, soft and nontender.   Central nervous system: Cannot alert.  Patient moves all extremities. Extremities: Bilateral lower extremity edema, is improving.  Data Reviewed: I have personally reviewed following labs and imaging studies  CBC: Recent Labs  Lab 10/25/22 0730 10/25/22 0745 10/27/22 0420 10/28/22 0500 10/29/22 1325 10/30/22 0017 10/31/22 0016  WBC 18.3*   < > 13.6* 12.3* 13.3* 12.4* 11.9*  NEUTROABS 15.2*  --   --   --   --   --   --   HGB 8.4*   < > 7.6* 7.3* 8.2* 7.0* 7.5*  HCT 28.6*   < > 25.3* 24.4* 27.3* 23.3* 25.0*  MCV 97.3   < > 93.7 93.1 95.8 97.1 95.1  PLT 478*   < > 391 370 375 335 365   < > = values in this interval not displayed.    Basic Metabolic Panel: Recent Labs  Lab 10/27/22 0420 10/27/22 1526 10/28/22 0500 10/29/22 1325 10/30/22 0017  10/31/22 0016  NA 135 137 137 137 137 137  K 4.3 4.2 4.1 4.2 4.3 4.2  CL 100 102 99 98 96* 99  CO2 25 25 26 25 26 25   GLUCOSE 86 208* 100* 154* 176* 163*  BUN 55* 57* 60* 73* 67* 67*  CREATININE 2.70* 2.81* 3.04* 3.26* 3.34* 3.18*  CALCIUM 8.0* 7.9* 8.3* 8.4* 8.5* 8.7*  MG 2.1  --  2.1 2.1 2.0 2.1  PHOS 4.5 4.5 5.2* 6.2* 6.3* 5.7*    GFR: Estimated Creatinine Clearance: 16.6 mL/min (A) (by C-G formula based on SCr of 3.18 mg/dL (H)). Liver Function Tests: Recent Labs  Lab 10/26/22 0207 10/26/22 1610 10/27/22 1526 10/28/22 0500 10/29/22 1325 10/30/22 0017 10/31/22 0016  AST 14*  --   --   --   --   --   --   ALT 18  --   --   --   --   --   --   ALKPHOS 49  --   --   --   --   --   --   BILITOT 0.5  --   --   --   --   --   --   PROT 6.0*  --   --   --   --   --   --  ALBUMIN 2.2*   < > 2.1* 2.1* 2.4* 2.0* 2.4*   < > = values in this interval not displayed.    No results for input(s): "LIPASE", "AMYLASE" in the last 168 hours. No results for input(s): "AMMONIA" in the last 168 hours. Coagulation Profile: No results for input(s): "INR", "PROTIME" in the last 168 hours. Cardiac Enzymes: No results for input(s): "CKTOTAL", "CKMB", "CKMBINDEX", "TROPONINI" in the last 168 hours. BNP (last 3 results) Recent Labs    02/16/22 1008  PROBNP 699    HbA1C: No results for input(s): "HGBA1C" in the last 72 hours. CBG: Recent Labs  Lab 10/30/22 1558 10/30/22 2111 10/31/22 0617 10/31/22 1112 10/31/22 1602  GLUCAP 172* 148* 150* 169* 185*    Lipid Profile: No results for input(s): "CHOL", "HDL", "LDLCALC", "TRIG", "CHOLHDL", "LDLDIRECT" in the last 72 hours. Thyroid Function Tests: No results for input(s): "TSH", "T4TOTAL", "FREET4", "T3FREE", "THYROIDAB" in the last 72 hours. Anemia Panel: No results for input(s): "VITAMINB12", "FOLATE", "FERRITIN", "TIBC", "IRON", "RETICCTPCT" in the last 72 hours. Urine analysis:    Component Value Date/Time   COLORURINE  YELLOW 07/22/2021 0011   APPEARANCEUR CLOUDY (A) 07/22/2021 0011   LABSPEC 1.023 07/22/2021 0011   PHURINE 5.0 07/22/2021 0011   GLUCOSEU 50 (A) 07/22/2021 0011   GLUCOSEU NEGATIVE 12/09/2018 0944   HGBUR NEGATIVE 07/22/2021 0011   BILIRUBINUR NEGATIVE 07/22/2021 0011   KETONESUR NEGATIVE 07/22/2021 0011   PROTEINUR >=300 (A) 07/22/2021 0011   UROBILINOGEN 0.2 12/09/2018 0944   NITRITE NEGATIVE 07/22/2021 0011   LEUKOCYTESUR NEGATIVE 07/22/2021 0011   Sepsis Labs: @LABRCNTIP (procalcitonin:4,lacticidven:4)  ) Recent Results (from the past 240 hour(s))  Culture, blood (routine x 2)     Status: None   Collection Time: 10/25/22  8:24 AM   Specimen: BLOOD LEFT ARM  Result Value Ref Range Status   Specimen Description BLOOD LEFT ARM  Final   Special Requests   Final    BOTTLES DRAWN AEROBIC AND ANAEROBIC Blood Culture adequate volume   Culture   Final    NO GROWTH 5 DAYS Performed at [facility], [address], [place], [place] [address]    Report Status 10/30/2022 FINAL  Final  Culture, blood (routine x 2)     Status: None   Collection Time: 10/25/22  8:26 AM   Specimen: BLOOD  Result Value Ref Range Status   Specimen Description BLOOD SITE NOT SPECIFIED  Final   Special Requests   Final    BOTTLES DRAWN AEROBIC AND ANAEROBIC Blood Culture results may not be optimal due to an inadequate volume of blood received in culture bottles   Culture   Final    NO GROWTH 5 DAYS Performed at [facility], [address], [place], [place] [address]    Report Status 10/30/2022 FINAL  Final  MRSA Next Gen by PCR, Nasal     Status: None   Collection Time: 10/25/22 11:46 AM   Specimen: Nasal Mucosa; Nasal Swab  Result Value Ref Range Status   MRSA by PCR Next Gen NOT DETECTED NOT DETECTED Final    Comment: (NOTE) The GeneXpert MRSA Assay (FDA approved for NASAL specimens only), is one component of a comprehensive MRSA colonization surveillance program. It is not intended to  diagnose MRSA infection nor to guide or monitor treatment for MRSA infections. Test performance is not FDA approved in patients less than 21 years old. Performed at [facility], [address], [place], [place] [address]  Radiology Studies: DG CHEST PORT 1 VIEW  Result Date: 10/31/2022 CLINICAL DATA:  Shortness of breath. EXAM: PORTABLE CHEST 1 VIEW COMPARISON:  10/27/2022 FINDINGS: Patient is slightly rotated to the right. Lungs are adequately inflated and demonstrate mild hazy prominence of the central pulmonary vessels suggesting mild vascular congestion. There is mild bibasilar opacification likely small amount of bilateral pleural fluid with associated atelectasis. Mild stable cardiomegaly. Remainder of the exam is unchanged. IMPRESSION: Mild vascular congestion with small amount of bilateral pleural fluid and associated bibasilar atelectasis. Infection in the lung bases is possible. Electronically Signed   By: Elberta Fortis M.D.   On: 10/31/2022 08:42        Scheduled Meds:  amLODipine  10 mg Oral Daily   apixaban  2.5 mg Oral BID   atorvastatin  40 mg Oral Daily   bisacodyl  10 mg Rectal Daily   Or   bisacodyl  10 mg Oral Daily   darbepoetin (ARANESP) injection - NON-DIALYSIS  60 mcg Subcutaneous Q Wed-1800   furosemide  80 mg Intravenous BID   guaiFENesin  600 mg Oral BID   insulin aspart  0-6 Units Subcutaneous TID WC   isosorbide mononitrate  30 mg Oral Daily   levothyroxine  200 mcg Oral Q0600   multivitamin  1 tablet Per Tube QHS   mouth rinse  15 mL Mouth Rinse 4 times per day   sodium chloride flush  3 mL Intravenous Q12H   Continuous Infusions:  sodium chloride Stopped (10/26/22 1934)     LOS: 6 days    Time spent: 35 minutes.    Berton Mount, MD  [facility] Pager #: [phone] 7PM-7AM contact night coverage as above

## 2022-10-31 NOTE — Evaluation (Signed)
Clinical/Bedside Swallow Evaluation Patient Details  Name: Kayla [name] MRN: [id number] Date of Birth: 13-Feb-1941  Today's Date: 10/31/2022 Time: SLP Start Time (ACUTE ONLY): 1021 SLP Stop Time (ACUTE ONLY): 1031 SLP Time Calculation (min) (ACUTE ONLY): 10 min  Past Medical History:  Past Medical History:  Diagnosis Date   Allergy    Anxiety    Arthritis    CAD (coronary artery disease)    Mild per remote cath in 2006, /    Nuclear, January, 2013, no significant abnormality,   Carotid artery disease (HCC)    Doppler, January, 2013, 0 39% RIC A., 40-59% LICA, stable   Cataract    CHF (congestive heart failure) (HCC)    Chronic kidney disease    Complication of anesthesia    wakes up "mean"   COPD 02/16/2007   Intolerant of Spiriva, tudorza Intolerant of Trelegy    COPD (chronic obstructive pulmonary disease) (HCC)    Coronary atherosclerosis 11/24/2008   Catheterization 2006, nonobstructive coronary disease   //   nuclear 2013, low risk     Diabetes mellitus, type 2 (HCC)    Diverticula of colon    DIVERTICULOSIS OF COLON 03/23/2007   Qualifier: Diagnosis of  By: Craige Cotta MD, Vineet     Dizziness    occasional   Dizzy spells 05/04/2011   DM (diabetes mellitus) (HCC) 03/23/2007   Qualifier: Diagnosis of  By: Craige Cotta MD, Vineet     Dyspnea    On exertion   Ejection fraction    Essential hypertension 02/16/2007   Qualifier: Diagnosis of  By: Earlene Plater CMA, Tammy     G E R D 03/23/2007   Qualifier: Diagnosis of  By: Craige Cotta MD, Vineet     Gall bladder disease 04/28/2016   Gastritis and gastroduodenitis    GERD (gastroesophageal reflux disease)    Goiter    hx of   Headache(784.0)    migraine   Hyperlipidemia    Hypertension    Hypothyroidism    HYPOTHYROIDISM, POSTSURGICAL 06/04/2008   Qualifier: Diagnosis of  By: Everardo All MD, Sean A    Left ventricular hypertrophy    Leg edema    occasional swelling   Leg swelling 12/09/2018   Myofascial pain syndrome, cervical 01/06/2018    Obesity    OBESITY 11/24/2008   Qualifier: Diagnosis of  By: Vikki Ports     OBSTRUCTIVE SLEEP APNEA 02/16/2007   Auto CPAP 09/03/13 to 10/02/13 >> used on 30 of 30 nights with average 6 hrs and 3 min.  Average AHI is 3.1 with median CPAP 5 cm H2O and 95 th percentile CPAP 6 cm H2O.     Occipital neuralgia of right side 01/06/2018   OSA (obstructive sleep apnea)    cpap setting of 12   Pain in joint of right hip 01/21/2018   Palpitations 01/21/2011   48 hour Holter, 2015, scattered PACs and PVCs.    Paroxysmal atrial fibrillation (HCC)    Pneumonia    PONV (postoperative nausea and vomiting)    severe after every surgery   Pulmonary hypertension, secondary    RHINITIS 07/21/2010        RUQ abdominal pain    S/P left TKA 07/27/2011   Sinus node dysfunction (HCC) 11/08/2015   Sleep apnea    Spinal stenosis of cervical region 11/07/2014   Urinary frequency 12/09/2018   UTI (urinary tract infection) 08/15/2019   per pt report   VENTRICULAR HYPERTROPHY, LEFT 11/24/2008   Qualifier: Diagnosis of  By:  Vikki Ports     Past Surgical History:  Past Surgical History:  Procedure Laterality Date   ABDOMINAL HYSTERECTOMY     with appendectomy and colon polypectomy   AMPUTATION TOE Left 12/15/2019   Procedure: AMPUTATION  LEFT GREAT TOE;  Surgeon: Park Liter, DPM;  Location: [facility];  Service: Podiatry;  Laterality: Left;   AMPUTATION TOE Left 01/22/2022   Procedure: AMPUTATION TOE 4th digit;  Surgeon: Candelaria Stagers, DPM;  Location: [facility];  Service: Podiatry;  Laterality: Left;   AMPUTATION TOE Left 09/04/2022   Procedure: LEFT BIG TOE AMPUTAION;  Surgeon: Vivi Barrack, DPM;  Location: [facility];  Service: Podiatry;  Laterality: Left;   ANTERIOR CERVICAL DECOMP/DISCECTOMY FUSION N/A 11/07/2014   Procedure: Anterior Cervical diskectomy and fusion Cervical four-five, Cervical five-six, Cervical six-seven;  Surgeon: Donalee Citrin, MD;  Location: [facility];  Service: Neurosurgery;   Laterality: N/A;   APPENDECTOMY     ARTERY BIOPSY Right 02/16/2017   Procedure: BIOPSY TEMPORAL ARTERY;  Surgeon: Renford Dills, MD;  Location: [facility];  Service: Vascular;  Laterality: Right;   ARTERY BIOPSY Left 04/16/2020   Procedure: BIOPSY TEMPORAL ARTERY;  Surgeon: Renford Dills, MD;  Location: [facility];  Service: Vascular;  Laterality: Left;  LEFT TEMPLE   BACK SURGERY     CARDIAC CATHETERIZATION     CATARACT EXTRACTION W/ INTRAOCULAR LENS  IMPLANT, BILATERAL Bilateral    CHOLECYSTECTOMY N/A 04/29/2016   Procedure: LAPAROSCOPIC CHOLECYSTECTOMY WITH INTRAOPERATIVE CHOLANGIOGRAM;  Surgeon: Ovidio Kin, MD;  Location: [facility];  Service: General;  Laterality: N/A;   EP IMPLANTABLE DEVICE N/A 11/08/2015   MRI COMPATABLE -- Procedure: Pacemaker Implant;  Surgeon: Duke Salvia, MD;  [facility]: [facility];  Service: Cardiovascular;  Laterality: N/A;   INCISION AND DRAINAGE ABSCESS Right 05/30/2021   Procedure: IRRIGATION AND DEBRIDEMENT OF PREPATELLA BURSA;  Surgeon: Durene Romans, MD;  Location: [facility];  Service: Orthopedics;  Laterality: Right;  60 wants standard knee draping and pulse lavage   INCISION AND DRAINAGE ABSCESS Right 07/22/2021   Procedure: INCISION AND DRAINAGE ABSCESS RIGHT KNEE;  Surgeon: Durene Romans, MD;  Location: [facility];  Service: Orthopedics;  Laterality: Right;   INSERT / REPLACE / REMOVE PACEMAKER  2017   Dr. [facility])   IR FLUORO GUIDE CV LINE RIGHT  07/25/2021   IR REMOVAL TUN CV CATH W/O FL  09/04/2021   IR US GUIDE VASC ACCESS RIGHT  07/25/2021   JOINT REPLACEMENT     LEAD EXTRACTION N/A 09/08/2022   Procedure: LEAD EXTRACTION;  Surgeon: Marinus Maw, MD;  [facility];  Service: Cardiovascular;  Laterality: N/A;   LEAD EXTRACTION N/A 09/11/2022   Procedure: LEAD EXTRACTION;  Surgeon: Marinus Maw, MD;  [facility];  Service: Cardiovascular;  Laterality: N/A;   LUMBAR DISC SURGERY  1990's   x 2   TEE  WITHOUT CARDIOVERSION N/A 09/07/2022   Procedure: TRANSESOPHAGEAL ECHOCARDIOGRAM;  Surgeon: Christell Constant, MD;  [facility];  Service: Cardiovascular;  Laterality: N/A;   THYROIDECTOMY  2011   TOTAL KNEE ARTHROPLASTY  07/27/2011   Procedure: TOTAL KNEE ARTHROPLASTY;  Surgeon: Shelda Pal, MD;  Location: [facility];  Service: Orthopedics;  Laterality: Left;   UPPER ESOPHAGEAL ENDOSCOPIC ULTRASOUND (EUS) N/A 05/14/2016   Procedure: UPPER ESOPHAGEAL ENDOSCOPIC ULTRASOUND (EUS);  Surgeon: Rachael Fee, MD;  Location: [facility];  Service: Endoscopy;  Laterality: N/A;  radial  only-will be done mod if needed   HPI:  82 y.o. female admitted with respiratory distress. History of obesity, DM II, HTN, HLD, CAD, CHF, PAF on Eliquis, SSS s/p PPM, CKD, Anemia, COPD, GERD, hypothyroidism, chronic venous insufficiency, s/p left first ray amputation (5/3) with active osteomyelitis. CXR Improved bibasilar opacities. BSE 6/25 without s/s aspiration in setting of respiratory distress, reg/thin recommended. ST re-ordered with note in the chart of "feeling like food is stuck in her throat and coughing with liquids."    Assessment / Plan / Recommendation  Clinical Impression  Pt seen for skilled ST BSE and presents similar to swallow assessments on 6/25 and 6/28- no follow up was recommended at that time, suspected difficulty due to COPD and GERD. Pt referred to speech again this AM with concerns of "it feels like there's a knot in my throat". Pt is on a regular/thin duet. Upon arrival the pt, reports not being interested in much PO intake due to discomfort. The pt agreeable to trials of thin liquids. The pt was repositioned to sit upright and reports discomfort to the position due to COPD but reports having all PO intake upright and remaining upright following meals. The pt had x2 sips of thin via straw and had immediate cough x2 for both trials. The pt pointed to her lower pharyngeal space/upper  esophageal opening and said, "see it's right there where it gets stuck". SLP and pt discussed GI concerns, where the pt reports addressing her GERD via medication but denies any further evaluating measures. Pt also reports coughing up thick yellow secretions t/o the day, SLP discussed possible globus sensation due to both GERD and poorly cleared mucus. Similar to previous assessments, suspected difficulties due to GERD and COPD. Safest diet remains regular/thins with SLP to follow up with MBS if the mucus does not clear the globus sensation with swallowing. SLP Visit Diagnosis: Dysphagia, unspecified (R13.10)    Aspiration Risk  Mild aspiration risk    Diet Recommendation Regular;Thin liquid    Liquid Administration via: Straw;Cup Medication Administration: Crushed with puree Supervision: Patient able to self feed Compensations: Slow rate;Small sips/bites Postural Changes: Remain upright for at least 30 minutes after po intake;Seated upright at 90 degrees    Other  Recommendations Oral Care Recommendations: Oral care BID    Recommendations for follow up therapy are one component of a multi-disciplinary discharge planning process, led by the attending physician.  Recommendations may be updated based on patient status, additional functional criteria and insurance authorization.     Assistance Recommended at Discharge    Functional Status Assessment Patient has had a recent decline in their functional status and demonstrates the ability to make significant improvements in function in a reasonable and predictable amount of time.  Frequency and Duration min 2x/week  1 week       Prognosis Prognosis for improved oropharyngeal function: Good      Swallow Study   General HPI: 82 y.o. female admitted with respiratory distress. History of obesity, DM II, HTN, HLD, CAD, CHF, PAF on Eliquis, SSS s/p PPM, CKD, Anemia, COPD, GERD, hypothyroidism, chronic venous insufficiency, s/p left first ray  amputation (5/3) with active osteomyelitis. CXR Improved bibasilar opacities. BSE 6/25 without s/s aspiration in setting of respiratory distress, reg/thin recommended. ST re-ordered with note in the chart of "feeling like food is stuck in her throat and coughing with liquids." Type of Study: Bedside Swallow Evaluation Previous Swallow Assessment: Pt given BSE's on 6/25 and 6/28, SLP's suspected difficulties due  to COPD and GERD. no f/u noted Diet Prior to this Study: Regular;Thin liquids (Level 0) Respiratory Status: Nasal cannula History of Recent Intubation: No Behavior/Cognition: Alert;Cooperative;Pleasant mood Oral Care Completed by SLP: No Oral Cavity - Dentition: Dentures, top;Dentures, bottom Vision: Functional for self-feeding Baseline Vocal Quality: Hoarse Volitional Cough: Strong Volitional Swallow: Able to elicit    Oral/Motor/Sensory Function Overall Oral Motor/Sensory Function: Within functional limits   Ice Chips     Thin Liquid Thin Liquid: Impaired Presentation: Self Fed;Straw Pharyngeal  Phase Impairments: Cough - Immediate Other Comments: Pt had x2 sips of thin liquid with an immediate cough x2 for both attempts, the pt stated "see it's right there" and pointed to her lower pharyngeal space/upper esophageal opening    Nectar Thick     Honey Thick     Puree     Solid           Dione Housekeeper M.S. CF-SLP

## 2022-10-31 NOTE — Progress Notes (Signed)
   10/31/22 0127  BiPAP/CPAP/SIPAP  Reason BIPAP/CPAP not in use Non-compliant   Pt refused CPAP for night time use.

## 2022-11-01 ENCOUNTER — Inpatient Hospital Stay (HOSPITAL_COMMUNITY): Payer: Medicare HMO

## 2022-11-01 DIAGNOSIS — J9602 Acute respiratory failure with hypercapnia: Secondary | ICD-10-CM | POA: Diagnosis not present

## 2022-11-01 DIAGNOSIS — Z515 Encounter for palliative care: Secondary | ICD-10-CM

## 2022-11-01 DIAGNOSIS — E875 Hyperkalemia: Secondary | ICD-10-CM | POA: Diagnosis not present

## 2022-11-01 DIAGNOSIS — I701 Atherosclerosis of renal artery: Secondary | ICD-10-CM

## 2022-11-01 DIAGNOSIS — J9601 Acute respiratory failure with hypoxia: Secondary | ICD-10-CM | POA: Diagnosis not present

## 2022-11-01 DIAGNOSIS — N179 Acute kidney failure, unspecified: Secondary | ICD-10-CM | POA: Diagnosis not present

## 2022-11-01 DIAGNOSIS — R001 Bradycardia, unspecified: Secondary | ICD-10-CM | POA: Diagnosis not present

## 2022-11-01 LAB — RENAL FUNCTION PANEL
Albumin: 2.3 g/dL — ABNORMAL LOW (ref 3.5–5.0)
Anion gap: 10 (ref 5–15)
BUN: 62 mg/dL — ABNORMAL HIGH (ref 8–23)
CO2: 27 mmol/L (ref 22–32)
Calcium: 8.8 mg/dL — ABNORMAL LOW (ref 8.9–10.3)
Chloride: 99 mmol/L (ref 98–111)
Creatinine, Ser: 2.95 mg/dL — ABNORMAL HIGH (ref 0.44–1.00)
GFR, Estimated: 15 mL/min — ABNORMAL LOW (ref >60)
Glucose, Bld: 162 mg/dL — ABNORMAL HIGH (ref 70–99)
Phosphorus: 5.4 mg/dL — ABNORMAL HIGH (ref 2.5–4.6)
Potassium: 4 mmol/L (ref 3.5–5.1)
Sodium: 136 mmol/L (ref 135–145)

## 2022-11-01 LAB — CBC
HCT: 24.8 % — ABNORMAL LOW (ref 36.0–46.0)
Hemoglobin: 7.4 g/dL — ABNORMAL LOW (ref 12.0–15.0)
MCH: 28.9 pg (ref 26.0–34.0)
MCHC: 29.8 g/dL — ABNORMAL LOW (ref 30.0–36.0)
MCV: 96.9 fL (ref 80.0–100.0)
Platelets: 363 10*3/uL (ref 150–400)
RBC: 2.56 MIL/uL — ABNORMAL LOW (ref 3.87–5.11)
RDW: 17.4 % — ABNORMAL HIGH (ref 11.5–15.5)
WBC: 12.4 10*3/uL — ABNORMAL HIGH (ref 4.0–10.5)
nRBC: 0.3 % — ABNORMAL HIGH (ref 0.0–0.2)

## 2022-11-01 LAB — GLUCOSE, CAPILLARY
Glucose-Capillary: 147 mg/dL — ABNORMAL HIGH (ref 70–99)
Glucose-Capillary: 196 mg/dL — ABNORMAL HIGH (ref 70–99)
Glucose-Capillary: 219 mg/dL — ABNORMAL HIGH (ref 70–99)
Glucose-Capillary: 238 mg/dL — ABNORMAL HIGH (ref 70–99)

## 2022-11-01 LAB — MAGNESIUM: Magnesium: 2.1 mg/dL (ref 1.7–2.4)

## 2022-11-01 MED ORDER — FENTANYL CITRATE PF 50 MCG/ML IJ SOSY
12.5000 ug | PREFILLED_SYRINGE | INTRAMUSCULAR | Status: DC | PRN
  Administered 2022-11-04 – 2022-11-12 (×9): 12.5 ug via INTRAVENOUS
  Filled 2022-11-01 (×9): qty 1

## 2022-11-01 MED ORDER — LIDOCAINE 5 % EX PTCH
1.0000 | MEDICATED_PATCH | CUTANEOUS | Status: DC
  Administered 2022-11-01 – 2022-11-13 (×13): 1 via TRANSDERMAL
  Filled 2022-11-01 (×14): qty 1

## 2022-11-01 MED ORDER — ONDANSETRON HCL 4 MG/2ML IJ SOLN
4.0000 mg | Freq: Four times a day (QID) | INTRAMUSCULAR | Status: AC | PRN
  Administered 2022-11-01 (×2): 4 mg via INTRAVENOUS
  Filled 2022-11-01 (×2): qty 2

## 2022-11-01 MED ORDER — HYDROCODONE-ACETAMINOPHEN 5-325 MG PO TABS
1.0000 | ORAL_TABLET | ORAL | Status: DC | PRN
  Administered 2022-11-01 – 2022-11-03 (×7): 2 via ORAL
  Administered 2022-11-04: 1 via ORAL
  Administered 2022-11-04 – 2022-11-10 (×10): 2 via ORAL
  Filled 2022-11-01 (×3): qty 2
  Filled 2022-11-01: qty 1
  Filled 2022-11-01 (×16): qty 2

## 2022-11-01 NOTE — Progress Notes (Signed)
[facility] Progress Note    Assessment/ Plan:   Acute kidney injury on CKD stage IV likely due to CHF exacerbation/fluid overload/bradycardia leading to reduced renal perfusion.  B/L Cr around 2, followed by me as an outpatient. She had hyperkalemia, anasarca and did not respond with IV diuretics.  Also had features of uremic encephalopathy including tremor, AMS. -temp HD catheter placed on 6/23, started CRRT 6/23. CRRT off on 6/24 given access issues/alarms. RIJ temp line removed 6/26. Uremic symptoms improved at that time -Cr down to 2.95 today. Would recommend continuing with lasix 80mg  IV BID for now -no indications for HD/CRRT at this time-volume status better and not exhibiting any uremic signs/symptoms. Side note: discussed with patient and husband and she would want to pursue dialysis in the long term. We did discuss this in the office as well and she is interested in home therapies down the road if/when indicated. Time will tell where her kidney function will settle out to -she's open for a renal artery duplex per primary-not sure why -daily labs, strict I/O   Hyperkalemia: Received medical treatment in the ER.  Improved with CRRT (currently off). On lasix + renal diet. K WNL now   Bradycardia: Seen by cardiology during admit. H/o PPM extraction secondary to bacteremia (Sep 07, 2022).  Off dopamine gtt, HR stable. Avoid beta blockers   Acute on chronic respiratory failure/respiratory distress with hypoxia: lasix as above. CXR 6/25 revealed improvement in opacities. On 3L [place] at baseline @ home -CXR 6/29 personally reviewed: +vascular congestion -c/w lasix 80mg  IV BID for now.   Sepsis due to possible pneumonia: On antibiotics per primary team.  Hyponatremia, mild: likely secondary to AKI and hypervolemia. WNL now  Anemia: transfuse for Hgb <7. ESA start 6/26. Feraheme x 1 dose 6/28  CKD MBD: PO4 acceptable, c/w renal diet  Subjective:   Patient seen and examined  bedside. RN at bedside. Patient keeps stating that she wants to get up. She otherwise reports that her breathing is better as compared to yesterday.   Objective:   BP (!) 143/69 (BP Location: Left Arm)   Pulse (!) 50   Temp 98.3 F (36.8 C) (Oral)   Resp 17   Ht 5\' 5"  (1.651 m)   Wt 103.6 kg   SpO2 98%   BMI 38.01 kg/m   Intake/Output Summary (Last 24 hours) at 11/01/2022 0818 Last data filed at 10/31/2022 2343 Gross per 24 hour  Intake 300 ml  Output 1300 ml  Net -1000 ml   Weight change:   Physical Exam: Gen: NAD CVS: RRR Resp: slightly decreased breath sounds bibasilar, no w/r/r/c heard, normal WOB Abd: soft, nt/nd Ext: no edema b/l Les (improved) Neuro: awake, alert, no asterixis Dialysis access: none  Imaging: DG CHEST PORT 1 VIEW  Result Date: 10/31/2022 CLINICAL DATA:  Shortness of breath. EXAM: PORTABLE CHEST 1 VIEW COMPARISON:  10/27/2022 FINDINGS: Patient is slightly rotated to the right. Lungs are adequately inflated and demonstrate mild hazy prominence of the central pulmonary vessels suggesting mild vascular congestion. There is mild bibasilar opacification likely small amount of bilateral pleural fluid with associated atelectasis. Mild stable cardiomegaly. Remainder of the exam is unchanged. IMPRESSION: Mild vascular congestion with small amount of bilateral pleural fluid and associated bibasilar atelectasis. Infection in the lung bases is possible. Electronically Signed   By: Elberta Fortis M.D.   On: 10/31/2022 08:42    Labs: BMET Recent Labs  Lab 10/27/22 0420 10/27/22 1526 10/28/22 0500 10/29/22  1325 10/30/22 0017 10/31/22 0016 11/01/22 0021  NA 135 137 137 137 137 137 136  K 4.3 4.2 4.1 4.2 4.3 4.2 4.0  CL 100 102 99 98 96* 99 99  CO2 25 25 26 25 26 25 27   GLUCOSE 86 208* 100* 154* 176* 163* 162*  BUN 55* 57* 60* 73* 67* 67* 62*  CREATININE 2.70* 2.81* 3.04* 3.26* 3.34* 3.18* 2.95*  CALCIUM 8.0* 7.9* 8.3* 8.4* 8.5* 8.7* 8.8*  PHOS 4.5 4.5 5.2*  6.2* 6.3* 5.7* 5.4*   CBC Recent Labs  Lab 10/29/22 1325 10/30/22 0017 10/31/22 0016 11/01/22 0021  WBC 13.3* 12.4* 11.9* 12.4*  HGB 8.2* 7.0* 7.5* 7.4*  HCT 27.3* 23.3* 25.0* 24.8*  MCV 95.8 97.1 95.1 96.9  PLT 375 335 365 363    Medications:     amLODipine  10 mg Oral Daily   apixaban  2.5 mg Oral BID   atorvastatin  40 mg Oral Daily   bisacodyl  10 mg Rectal Daily   Or   bisacodyl  10 mg Oral Daily   darbepoetin (ARANESP) injection - NON-DIALYSIS  60 mcg Subcutaneous Q Wed-1800   furosemide  80 mg Intravenous BID   guaiFENesin  600 mg Oral BID   insulin aspart  0-6 Units Subcutaneous TID WC   isosorbide mononitrate  30 mg Oral Daily   levothyroxine  200 mcg Oral Q0600   multivitamin  1 tablet Per Tube QHS   mouth rinse  15 mL Mouth Rinse 4 times per day   sodium chloride flush  3 mL Intravenous Q12H      Anthony Sar, MD [facility] 11/01/2022, 8:18 AM

## 2022-11-01 NOTE — Consult Note (Signed)
Consultation Note Date: 11/01/2022 at 1000  Patient Name: Kayla [name]  DOB: [birth year]  MRN: [id number]  Age / Sex: 82 y.o., female  PCP: Ailene Ravel, MD Referring Physician: Barnetta Chapel, MD  Reason for Consultation: Establishing goals of care  HPI/Patient Profile: 82 y.o. female  with past medical history of osteomyelitis, type 2 diabetes with diabetic polyneuropathy, SSS with pacemaker, HLD, chronic venous insufficiency, obesity, COPD, gout, iron deficiency anemia, HTN, proximal A-fib, discectomy and fusion of C4-7 (2016), CKD (stage IV), CHF, CAD, laparoscopic cholecystectomy (2017) and recent MRSA bacteremia/infective endocarditis admitted on 10/25/2022 with SOB.   Patient is being treated for pulmonary edema, respiratory failure, junctional bradycardia, AKI, and hyperkalemia.  PMT was consulted to discuss goals of care.  Clinical Assessment and Goals of Care: I have reviewed medical records including EPIC notes, labs and imaging, assessed the patient and then met with patient and her husband at bedside to discuss diagnosis prognosis, GOC, EOL wishes, disposition and options.  I introduced Palliative Medicine as specialized medical care for people living with serious illness. It focuses on providing relief from the symptoms and stress of a serious illness. The goal is to improve quality of life for both the patient and the family.  I attempted to discuss a brief life review but patient shared she "wants something to happen".  When asked to elaborate further, patient shares she "knows something is wrong and wants someone to do something about it".  Patient's husband shares patient is in pain and unable to take p.o. medications at this time.  Patient scheduled for renal ultrasound for which they are transporting her to within the next few minutes.    I attempted to assess patient's  functional and nutritional status prior to admission.  Patient shares that "no one understands what is going on with me".  Patient husband confirmed patient has transient confusion.  He shares that patient was doing well at home and her pain was being managed by a clinic in [place].  He endorses she was taking hydrocodone 4 times a day and that up until recently that was relieving her chronic pain.  I counseled with RN who shared that when patient returns from ultrasound she will receive hydrocodone.  Patient's husband inquired about why patient needed an ultrasound.  We discussed renal stenosis and need for further imaging to determine course of treatment.  Questions and concerns were addressed.  I attempted to elicit values and goals important to the patient and her husband.  However, patient shared "for God sake can you please make something happen".  I assured her that ultrasound was coming to transport her soon and that her pain will be appropriately managed.  Further discussions needed to clarify patient's boundaries and goals of care but patient was unwilling to participate in those discussions at this time.  PMT will continue to follow and monitor the patient throughout her hospitalization.  1240: Received message from RN stating that patient received 2 tablets of norco three hours ago and is  still complaining of pain. I counseled with attending who is in agreement with my recommendation to shift norco to 1-2tablets Q4H with 12.78mcg Fentanyl IV avaialble PRN for severe/breakthrough pain.   Primary Decision Maker PATIENT  Physical Exam Vitals reviewed.  Constitutional:      General: She is not in acute distress.    Appearance: She is not ill-appearing.  HENT:     Head: Normocephalic.     Mouth/Throat:     Mouth: Mucous membranes are moist.  Eyes:     Pupils: Pupils are equal, round, and reactive to light.  Cardiovascular:     Rate and Rhythm: Bradycardia present.     Pulses:  Normal pulses.  Abdominal:     Palpations: Abdomen is soft.  Musculoskeletal:     Comments: Generalized weakness  Skin:    General: Skin is warm and dry.  Neurological:     Mental Status: She is alert.  Psychiatric:        Mood and Affect: Mood normal.        Behavior: Behavior normal.     Palliative Assessment/Data: 50%     Thank you for this consult. Palliative medicine will continue to follow and assist holistically.   Time Total: 75 minutes Greater than 50%  of this time was spent counseling and coordinating care related to the above assessment and plan.  Signed by: Georgiann Cocker, DNP, FNP-BC Palliative Medicine    Please contact Palliative Medicine Team phone at [phone] for questions and concerns.  For individual provider: See Loretha Stapler

## 2022-11-01 NOTE — Progress Notes (Signed)
Assisted pt with home CPAP. 3L of 02 bleed in.

## 2022-11-01 NOTE — Progress Notes (Signed)
Mobility Specialist: Progress Note   11/01/22 1336  Mobility  Activity Refused mobility   Attempted to see pt x2. Pt refused mobility with c/o abdominal pain. Will f/u as able.   Akiya Morr Mobility Specialist Please contact via SecureChat or Rehab office at [phone]

## 2022-11-01 NOTE — Plan of Care (Signed)

## 2022-11-01 NOTE — Progress Notes (Signed)
PROGRESS NOTE    UGOCHI DEPACE  ZOX:[id number] DOB: 1941/01/07 DOA: 10/25/2022 PCP: Ailene Ravel, MD  Outpatient Specialists:     Brief Narrative:  Patient is an 82 year old Caucasian female, significantly obese, with recent history of MRSA bacteremia/infective endocarditis for which patient was on antibiotics for a long time.  Other medical history includes coronary artery disease, carotid artery disease, chronic kidney disease stage IV, COPD, diabetes mellitus type 2, hypertension, hyperlipidemia, hypothyroidism and documented congestive heart failure.  Recent echocardiograms have revealed normal left ventricular ejection fraction, without mention of diastolic dysfunction.  Patient was admitted with pulmonary edema, acute kidney injury on chronic kidney disease stage IV with oliguria versus anuria, requiring CRRT for about 48 hours.  Improvement in renal function is noted.  Patient continues to make urine.  Hypercapnic respiratory failure was also noted on presentation.  Patient has been on home oxygen for but a year.  Patient was admitted to ICU, underwent CRRT, treated for respiratory failure and pulmonary edema.  TRH assumed care on 10/30/2022.    11/01/2022: Patient seen.  Patient continues to improve.  Patient is on IV Lasix 80 Mg twice daily.  Vascular Doppler ultrasound of renal arteries came back normal.  Input from nephrology team is appreciated.  Patient reported Left upper quadrant/Left lateral chest wall pain. Will order rib Xray to rule out fracture.  Renal function continues to improve.  Assessment & Plan:   Principal Problem:   Acute respiratory failure with hypoxia and hypercapnia (HCC) Active Problems:   AKI (acute kidney injury) (HCC)   Junctional bradycardia   Hyperkalemia  Acute kidney injury on chronic kidney disease stage IV/pulmonary edema: -AKI is resolving.   -Serum creatinine is down to 2.95. -Patient was on CRRT for about 48 hours. -Continue to monitor  input and input. -Avoid nephrotoxins. -Dose all medications assuming GFR of less than 10 mL/min. -Keep MAP greater than 65 mmHg. -Nephrology input is appreciated. -Patient is on IV Lasix 80 Mg twice daily.  Acute on chronic hypoxic/hypercapnic respiratory failure: -Patient has been on oxygen for about 1 year (2 L/min via nasal cannula). -Etiology is likely multifactorial. -Respiratory failure is slowly improving. -Discussed need to comply with CPAP management and, treatment of OSA/likely undiagnosed OHS. 11/01/2022: Stable.  COPD: -Seems compensated. -Continue current regimen.  Left lateral chest wall pain: -X-ray of the ribs revealed minimally displaced posterolateral left seventh rib fracture.  -Adequate pain control. -Lidocaine patch to left lateral rib cage area. -Supportive management.  Paroxysmal atrial fibrillation: -Controlled heart rate. -Patient is on apixaban 2.5 Mg p.o. twice daily.  Anemia of chronic kidney disease: -Patient has received IV iron. -Patient is on Aranesp every 2 weeks.  Hypertension: -Continue to optimize. -Goal blood pressure should be less than 130/80 mmHg.  Diabetes mellitus:  -Continue sliding scale insulin coverage. -Continue to monitor blood sugar and adjust accordingly.  Hyperlipidemia: -Continue atorvastatin.  Possible community acquired pneumonia: -Findings/events may be suggestive of pulmonary edema. -IV Zosyn has been discontinued.   DVT prophylaxis: Apixaban. Code Status: DO NOT RESUSCITATE. Family Communication: Husband. Disposition Plan: This will depend on hospital course.   Consultants:  Nephrology.  Procedures:  Patient was on CRRT.  Antimicrobials:  IV Zosyn has been discontinued.   Subjective: Reports left upper quadrant/left lateral rib cage pain. No fever or chills.  Objective: Vitals:   11/01/22 0200 11/01/22 0400 11/01/22 0724 11/01/22 1039  BP:  (!) 122/43 (!) 143/69 (!) 153/44  Pulse:  (!) 40  (!) 50  Resp:  14 17   Temp:  98.1 F (36.7 C) 98.3 F (36.8 C)   TempSrc:  Oral Oral   SpO2: 95% 97% 98%   Weight:      Height:        Intake/Output Summary (Last 24 hours) at 11/01/2022 1102 Last data filed at 10/31/2022 2343 Gross per 24 hour  Intake 180 ml  Output 800 ml  Net -620 ml    Filed Weights   10/27/22 0500 10/28/22 0600 10/29/22 0600  Weight: 110.1 kg 103.9 kg 103.6 kg    Examination:  General exam: Appears calm and comfortable.  Patient is obese. Respiratory system: Decreased air entry.   Cardiovascular system: S1 & S2 heard Gastrointestinal system: Abdomen is morbidly obese, soft and nontender.   Central nervous system: Cannot alert.  Patient moves all extremities. Extremities: Bilateral lower extremity edema, is improving.  Data Reviewed: I have personally reviewed following labs and imaging studies  CBC: Recent Labs  Lab 10/28/22 0500 10/29/22 1325 10/30/22 0017 10/31/22 0016 11/01/22 0021  WBC 12.3* 13.3* 12.4* 11.9* 12.4*  HGB 7.3* 8.2* 7.0* 7.5* 7.4*  HCT 24.4* 27.3* 23.3* 25.0* 24.8*  MCV 93.1 95.8 97.1 95.1 96.9  PLT 370 375 335 365 363    Basic Metabolic Panel: Recent Labs  Lab 10/28/22 0500 10/29/22 1325 10/30/22 0017 10/31/22 0016 11/01/22 0021  NA 137 137 137 137 136  K 4.1 4.2 4.3 4.2 4.0  CL 99 98 96* 99 99  CO2 26 25 26 25 27   GLUCOSE 100* 154* 176* 163* 162*  BUN 60* 73* 67* 67* 62*  CREATININE 3.04* 3.26* 3.34* 3.18* 2.95*  CALCIUM 8.3* 8.4* 8.5* 8.7* 8.8*  MG 2.1 2.1 2.0 2.1 2.1  PHOS 5.2* 6.2* 6.3* 5.7* 5.4*    GFR: Estimated Creatinine Clearance: 17.9 mL/min (A) (by C-G formula based on SCr of 2.95 mg/dL (H)). Liver Function Tests: Recent Labs  Lab 10/26/22 0207 10/26/22 1610 10/28/22 0500 10/29/22 1325 10/30/22 0017 10/31/22 0016 11/01/22 0021  AST 14*  --   --   --   --   --   --   ALT 18  --   --   --   --   --   --   ALKPHOS 49  --   --   --   --   --   --   BILITOT 0.5  --   --   --   --   --    --   PROT 6.0*  --   --   --   --   --   --   ALBUMIN 2.2*   < > 2.1* 2.4* 2.0* 2.4* 2.3*   < > = values in this interval not displayed.    No results for input(s): "LIPASE", "AMYLASE" in the last 168 hours. No results for input(s): "AMMONIA" in the last 168 hours. Coagulation Profile: No results for input(s): "INR", "PROTIME" in the last 168 hours. Cardiac Enzymes: No results for input(s): "CKTOTAL", "CKMB", "CKMBINDEX", "TROPONINI" in the last 168 hours. BNP (last 3 results) Recent Labs    02/16/22 1008  PROBNP 699    HbA1C: No results for input(s): "HGBA1C" in the last 72 hours. CBG: Recent Labs  Lab 10/31/22 0617 10/31/22 1112 10/31/22 1602 10/31/22 2125 11/01/22 0628  GLUCAP 150* 169* 185* 161* 147*    Lipid Profile: No results for input(s): "CHOL", "HDL", "LDLCALC", "TRIG", "CHOLHDL", "LDLDIRECT" in the last 72 hours. Thyroid Function Tests: No  results for input(s): "TSH", "T4TOTAL", "FREET4", "T3FREE", "THYROIDAB" in the last 72 hours. Anemia Panel: No results for input(s): "VITAMINB12", "FOLATE", "FERRITIN", "TIBC", "IRON", "RETICCTPCT" in the last 72 hours. Urine analysis:    Component Value Date/Time   COLORURINE YELLOW 07/22/2021 0011   APPEARANCEUR CLOUDY (A) 07/22/2021 0011   LABSPEC 1.023 07/22/2021 0011   PHURINE 5.0 07/22/2021 0011   GLUCOSEU 50 (A) 07/22/2021 0011   GLUCOSEU NEGATIVE 12/09/2018 0944   HGBUR NEGATIVE 07/22/2021 0011   BILIRUBINUR NEGATIVE 07/22/2021 0011   KETONESUR NEGATIVE 07/22/2021 0011   PROTEINUR >=300 (A) 07/22/2021 0011   UROBILINOGEN 0.2 12/09/2018 0944   NITRITE NEGATIVE 07/22/2021 0011   LEUKOCYTESUR NEGATIVE 07/22/2021 0011   Sepsis Labs: @LABRCNTIP (procalcitonin:4,lacticidven:4)  ) Recent Results (from the past 240 hour(s))  Culture, blood (routine x 2)     Status: None   Collection Time: 10/25/22  8:24 AM   Specimen: BLOOD LEFT ARM  Result Value Ref Range Status   Specimen Description BLOOD LEFT ARM  Final    Special Requests   Final    BOTTLES DRAWN AEROBIC AND ANAEROBIC Blood Culture adequate volume   Culture   Final    NO GROWTH 5 DAYS Performed at [facility], [address] [place], [place] [address]    Report Status 10/30/2022 FINAL  Final  Culture, blood (routine x 2)     Status: None   Collection Time: 10/25/22  8:26 AM   Specimen: BLOOD  Result Value Ref Range Status   Specimen Description BLOOD SITE NOT SPECIFIED  Final   Special Requests   Final    BOTTLES DRAWN AEROBIC AND ANAEROBIC Blood Culture results may not be optimal due to an inadequate volume of blood received in culture bottles   Culture   Final    NO GROWTH 5 DAYS Performed at [facility], [address] [place], [place] [address]    Report Status 10/30/2022 FINAL  Final  MRSA Next Gen by PCR, Nasal     Status: None   Collection Time: 10/25/22 11:46 AM   Specimen: Nasal Mucosa; Nasal Swab  Result Value Ref Range Status   MRSA by PCR Next Gen NOT DETECTED NOT DETECTED Final    Comment: (NOTE) The GeneXpert MRSA Assay (FDA approved for NASAL specimens only), is one component of a comprehensive MRSA colonization surveillance program. It is not intended to diagnose MRSA infection nor to guide or monitor treatment for MRSA infections. Test performance is not FDA approved in patients less than 50 years old. Performed at [facility], [address] [place], [place] [address]          Radiology Studies: VAS US RENAL ARTERY DUPLEX  Result Date: 11/01/2022 ABDOMINAL VISCERAL Patient Name:  ANGELEEN PUTMAN  Date of Exam:   11/01/2022 Medical Rec #: [id number]         Accession #:    [id number] Date of Birth: [birth year]        Patient Gender: F Patient Age:   74 years Exam Location:  [facility] Procedure:      VAS US RENAL ARTERY DUPLEX Referring Phys: [id number] Temari Schooler I Rosalee Tolley -------------------------------------------------------------------------------- Indications: Stenosis, renal  I70.1 High Risk Factors: Hypertension, hyperlipidemia, Diabetes. Limitations: Air/bowel gas, obesity, patient discomfort and patient positioning, patient constant movement, respiratory disturbance. Comparison Study: No prior studies. Performing Technologist: Chanda Busing RVT  Examination Guidelines: A complete evaluation includes B-mode imaging, spectral Doppler, color Doppler, and power Doppler as needed  of all accessible portions of each vessel. Bilateral testing is considered an integral part of a complete examination. Limited examinations for reoccurring indications may be performed as noted.  Duplex Findings: +--------------------+--------+--------+------+------------------+ Mesenteric          PSV cm/sEDV cm/sPlaque     Comments      +--------------------+--------+--------+------+------------------+ Aorta Mid             221      22                            +--------------------+--------+--------+------+------------------+ Celiac Artery Origin                      Unable to insonate +--------------------+--------+--------+------+------------------+ SMA Origin                                Unable to insonate +--------------------+--------+--------+------+------------------+    +------------------+--------+--------+-------+ Right Renal ArteryPSV cm/sEDV cm/sComment +------------------+--------+--------+-------+ Origin               19      8            +------------------+--------+--------+-------+ Proximal             24      8            +------------------+--------+--------+-------+ Mid                  42      9            +------------------+--------+--------+-------+ Distal               37      10           +------------------+--------+--------+-------+ +-----------------+--------+--------+-------+ Left Renal ArteryPSV cm/sEDV cm/sComment +-----------------+--------+--------+-------+ Origin              59      9             +-----------------+--------+--------+-------+ Proximal            70      5            +-----------------+--------+--------+-------+ Mid                 67      10           +-----------------+--------+--------+-------+ Distal              41      6            +-----------------+--------+--------+-------+ +------------+--------+--------+----+-----------+--------+--------+----+ Right KidneyPSV cm/sEDV cm/sRI  Left KidneyPSV cm/sEDV cm/sRI   +------------+--------+--------+----+-----------+--------+--------+----+ Upper Pole  29      5       0.82Upper Pole 23      4       0.82 +------------+--------+--------+----+-----------+--------+--------+----+ Mid         27      4       0.        21      5       0.77 +------------+--------+--------+----+-----------+--------+--------+----+ Lower Pole  22      4       0.80Lower Pole 25      6       0.76 +------------+--------+--------+----+-----------+--------+--------+----+ Hilar       43      8  0.81Hilar      28      6       0.78 +------------+--------+--------+----+-----------+--------+--------+----+ +------------------+----+------------------+-----+ Right Kidney          Left Kidney             +------------------+----+------------------+-----+ RAR                   RAR                     +------------------+----+------------------+-----+ RAR (manual)      0.19RAR (manual)      0.32  +------------------+----+------------------+-----+ Cortex                Cortex                  +------------------+----+------------------+-----+ Cortex thickness      Corex thickness         +------------------+----+------------------+-----+ Kidney length (cm)9.30Kidney length (cm)10.00 +------------------+----+------------------+-----+   Summary: Renal:  Right: Normal size right kidney. Normal right Resisitive Index. No        evidence of right renal artery stenosis. Left:  No evidence of left  renal artery stenosis. Normal size of        left kidney. Normal left Resistive Index.  *See table(s) above for measurements and observations.     Preliminary    DG CHEST PORT 1 VIEW  Result Date: 10/31/2022 CLINICAL DATA:  Shortness of breath. EXAM: PORTABLE CHEST 1 VIEW COMPARISON:  10/27/2022 FINDINGS: Patient is slightly rotated to the right. Lungs are adequately inflated and demonstrate mild hazy prominence of the central pulmonary vessels suggesting mild vascular congestion. There is mild bibasilar opacification likely small amount of bilateral pleural fluid with associated atelectasis. Mild stable cardiomegaly. Remainder of the exam is unchanged. IMPRESSION: Mild vascular congestion with small amount of bilateral pleural fluid and associated bibasilar atelectasis. Infection in the lung bases is possible. Electronically Signed   By: Elberta Fortis M.D.   On: 10/31/2022 08:42        Scheduled Meds:  amLODipine  10 mg Oral Daily   apixaban  2.5 mg Oral BID   atorvastatin  40 mg Oral Daily   bisacodyl  10 mg Rectal Daily   Or   bisacodyl  10 mg Oral Daily   darbepoetin (ARANESP) injection - NON-DIALYSIS  60 mcg Subcutaneous Q Wed-1800   furosemide  80 mg Intravenous BID   guaiFENesin  600 mg Oral BID   insulin aspart  0-6 Units Subcutaneous TID WC   isosorbide mononitrate  30 mg Oral Daily   levothyroxine  200 mcg Oral Q0600   multivitamin  1 tablet Per Tube QHS   mouth rinse  15 mL Mouth Rinse 4 times per day   sodium chloride flush  3 mL Intravenous Q12H   Continuous Infusions:  sodium chloride Stopped (10/26/22 1934)     LOS: 7 days    Time spent: 35 minutes.    Berton Mount, MD  [facility] Pager #: [phone] 7PM-7AM contact night coverage as above

## 2022-11-01 NOTE — Progress Notes (Signed)
Mobility Specialist: Progress Note   11/01/22 1456  Mobility  Activity Ambulated with assistance in hallway  Level of Assistance Contact guard assist, steadying assist  Assistive Device Front wheel walker  Distance Ambulated (ft) 36 ft (12'+24')  Activity Response Tolerated fair  Mobility Referral Yes  $Mobility charge 1 Mobility  Mobility Specialist Start Time (ACUTE ONLY) 1430  Mobility Specialist Stop Time (ACUTE ONLY) 1450  Mobility Specialist Time Calculation (min) (ACUTE ONLY) 20 min   Pre-Mobility: 51 HR, 96% SpO2 During Mobility: 92% SpO2 Post-Mobility: 42 HR, 120/41 (64) BP, 97% SpO2  Pt received in the chair and agreeable to mobility. Ambulated on 6 L/min [place]. Stopped x2 for seated breaks secondary to SOB and dizziness. Pt wheeled back to the room at end of session with call bell at her side. Chair alarm is on.   Rondi Ivy Mobility Specialist Please contact via SecureChat or Rehab office at [phone]

## 2022-11-01 NOTE — Progress Notes (Signed)
Placed patient on home CPAP for the night with oxygen set at 2lpm  

## 2022-11-01 NOTE — Progress Notes (Signed)
Renal artery duplex has been completed. Preliminary results can be found in CV Proc through chart review.   11/01/22 10:32 AM Olen Cordial RVT

## 2022-11-02 ENCOUNTER — Ambulatory Visit: Payer: Medicare HMO | Admitting: Adult Health

## 2022-11-02 DIAGNOSIS — N179 Acute kidney failure, unspecified: Secondary | ICD-10-CM | POA: Diagnosis not present

## 2022-11-02 DIAGNOSIS — J9601 Acute respiratory failure with hypoxia: Secondary | ICD-10-CM | POA: Diagnosis not present

## 2022-11-02 DIAGNOSIS — R001 Bradycardia, unspecified: Secondary | ICD-10-CM | POA: Diagnosis not present

## 2022-11-02 DIAGNOSIS — Z515 Encounter for palliative care: Secondary | ICD-10-CM | POA: Diagnosis not present

## 2022-11-02 LAB — GLUCOSE, CAPILLARY
Glucose-Capillary: 170 mg/dL — ABNORMAL HIGH (ref 70–99)
Glucose-Capillary: 253 mg/dL — ABNORMAL HIGH (ref 70–99)
Glucose-Capillary: 174 mg/dL — ABNORMAL HIGH (ref 70–99)
Glucose-Capillary: 184 mg/dL — ABNORMAL HIGH (ref 70–99)

## 2022-11-02 LAB — RENAL FUNCTION PANEL
Albumin: 2.2 g/dL — ABNORMAL LOW (ref 3.5–5.0)
Anion gap: 11 (ref 5–15)
BUN: 67 mg/dL — ABNORMAL HIGH (ref 8–23)
CO2: 26 mmol/L (ref 22–32)
Calcium: 8.7 mg/dL — ABNORMAL LOW (ref 8.9–10.3)
Chloride: 99 mmol/L (ref 98–111)
Creatinine, Ser: 3.37 mg/dL — ABNORMAL HIGH (ref 0.44–1.00)
GFR, Estimated: 13 mL/min — ABNORMAL LOW (ref >60)
Glucose, Bld: 230 mg/dL — ABNORMAL HIGH (ref 70–99)
Phosphorus: 6.6 mg/dL — ABNORMAL HIGH (ref 2.5–4.6)
Potassium: 4.5 mmol/L (ref 3.5–5.1)
Sodium: 136 mmol/L (ref 135–145)

## 2022-11-02 LAB — CBC
HCT: 26.8 % — ABNORMAL LOW (ref 36.0–46.0)
Hemoglobin: 7.7 g/dL — ABNORMAL LOW (ref 12.0–15.0)
MCH: 28.7 pg (ref 26.0–34.0)
MCHC: 28.7 g/dL — ABNORMAL LOW (ref 30.0–36.0)
MCV: 100 fL (ref 80.0–100.0)
Platelets: 355 10*3/uL (ref 150–400)
RBC: 2.68 MIL/uL — ABNORMAL LOW (ref 3.87–5.11)
RDW: 18.1 % — ABNORMAL HIGH (ref 11.5–15.5)
WBC: 12.6 10*3/uL — ABNORMAL HIGH (ref 4.0–10.5)
nRBC: 0.5 % — ABNORMAL HIGH (ref 0.0–0.2)

## 2022-11-02 LAB — MAGNESIUM: Magnesium: 2.2 mg/dL (ref 1.7–2.4)

## 2022-11-02 MED ORDER — RENA-VITE PO TABS
1.0000 | ORAL_TABLET | Freq: Every day | ORAL | Status: DC
  Administered 2022-11-02 – 2022-11-13 (×12): 1 via ORAL
  Filled 2022-11-02 (×12): qty 1

## 2022-11-02 MED ORDER — ORAL CARE MOUTH RINSE: 15.0000 mL | OROMUCOSAL | Status: DC | PRN

## 2022-11-02 NOTE — Progress Notes (Signed)
   Durable Medical Equipment (From admission, onward)        Start     Ordered  11/02/22 1440  For home use only DME lightweight manual wheelchair with seat cushion  Once      Comments: Patient suffers from chf  which impairs their ability to perform daily activities like bathing and dressing in the home.  A walker will not resolve  issue with performing activities of daily living. A wheelchair will allow patient to safely perform daily activities. Patient is not able to propel themselves in the home using a standard weight wheelchair due to general weakness. Patient can self propel in the lightweight wheelchair. Length of need Lifetime. Accessories: elevating leg rests (ELRs), wheel locks, extensions and anti-tippers.  11/02/22 1440

## 2022-11-02 NOTE — Progress Notes (Signed)
PROGRESS NOTE    Kayla [name]  UJW:[id number] DOB: [birth year] DOA: 10/25/2022 PCP: Ailene Ravel, MD  Outpatient Specialists:     Brief Narrative:  Patient is an 82 year old Caucasian female, significantly obese, with recent history of MRSA bacteremia/infective endocarditis for which patient was on antibiotics for a long time.  Other medical history includes coronary artery disease, carotid artery disease, chronic kidney disease stage IV, COPD, diabetes mellitus type 2, hypertension, hyperlipidemia, hypothyroidism and documented congestive heart failure.  Recent echocardiograms have revealed normal left ventricular ejection fraction, without mention of diastolic dysfunction.  Patient was admitted with pulmonary edema, acute kidney injury on chronic kidney disease stage IV with oliguria versus anuria, requiring CRRT for about 48 hours.  Improvement in renal function is noted.  Patient continues to make urine.  Hypercapnic respiratory failure was also noted on presentation.  Patient has been on home oxygen for but a year.  Patient was admitted to ICU, underwent CRRT, treated for respiratory failure and pulmonary edema.  TRH assumed care on 10/30/2022.    11/01/2022: Patient seen.  Patient continues to improve.  Patient is on IV Lasix 80 Mg twice daily.  Vascular Doppler ultrasound of renal arteries came back normal.  Input from nephrology team is appreciated.  Patient reported Left upper quadrant/Left lateral chest wall pain. Will order rib Xray to rule out fracture.  Renal function continues to improve.   11/02/2022: Mild worsening of renal function noted.  Serum creatinine today is 3.37 (up from 2.95).  Will hold discharge.  Will defer the management to the nephrology team.  Discharge patient once cleared for discharge by the nephrology team.  Assessment & Plan:   Principal Problem:   Acute respiratory failure with hypoxia and hypercapnia (HCC) Active Problems:   AKI (acute kidney injury)  (HCC)   Junctional bradycardia   Hyperkalemia  Acute kidney injury on chronic kidney disease stage IV/pulmonary edema: -AKI is resolving.   -Serum creatinine is down to 2.95. -Patient was on CRRT for about 48 hours. -Continue to monitor input and input. -Avoid nephrotoxins. -Dose all medications assuming GFR of less than 10 mL/min. -Keep MAP greater than 65 mmHg. -Nephrology input is appreciated. -Patient is on IV Lasix 80 Mg twice daily. 11/02/2022:  Hold IV Lasix due to worsening renal function.  Continue to monitor renal function.  Will defer to the nephrology team.  Acute on chronic hypoxic/hypercapnic respiratory failure: -Patient has been on oxygen for about 1 year (2 L/min via nasal cannula). -Etiology is likely multifactorial. -Respiratory failure is slowly improving. -Discussed need to comply with CPAP management and, treatment of OSA/likely undiagnosed OHS. 11/01/2022: Stable.  COPD: -Seems compensated. -Continue current regimen.  Left lateral chest wall pain: -X-ray of the ribs revealed minimally displaced posterolateral left seventh rib fracture.  -Adequate pain control. -Lidocaine patch to left lateral rib cage area. -Supportive management.  Paroxysmal atrial fibrillation: -Controlled heart rate. -Patient is on apixaban 2.5 Mg p.o. twice daily.  Anemia of chronic kidney disease: -Patient has received IV iron. -Patient is on Aranesp every 2 weeks.  Hypertension: -Continue to optimize. -Goal blood pressure should be less than 130/80 mmHg.  Diabetes mellitus:  -Continue sliding scale insulin coverage. -Continue to monitor blood sugar and adjust accordingly.  Hyperlipidemia: -Continue atorvastatin.  Possible community acquired pneumonia: -Findings/events may be suggestive of pulmonary edema. -IV Zosyn has been discontinued.   DVT prophylaxis: Apixaban. Code Status: DO NOT RESUSCITATE. Family Communication: Husband. Disposition Plan: This will depend  on hospital course.  Consultants:  Nephrology.  Procedures:  Patient was on CRRT.  Antimicrobials:  IV Zosyn has been discontinued.   Subjective: Reports left upper quadrant/left lateral rib cage pain. No fever or chills.  Objective: Vitals:   11/02/22 0415 11/02/22 0737 11/02/22 0934 11/02/22 1056  BP: (!) 135/36 (!) 132/48 (!) 142/46 (!) 127/47  Pulse: (!) 43 (!) 41  (!) 43  Resp: 20 14  14   Temp: 97.6 F (36.4 C) (!) 97.4 F (36.3 C)  (!) 97.5 F (36.4 C)  TempSrc: Oral Oral  Oral  SpO2: 95% 93%  92%  Weight:      Height:        Intake/Output Summary (Last 24 hours) at 11/02/2022 1142 Last data filed at 11/01/2022 2039 Gross per 24 hour  Intake 100 ml  Output 177 ml  Net -77 ml    Filed Weights   10/27/22 0500 10/28/22 0600 10/29/22 0600  Weight: 110.1 kg 103.9 kg 103.6 kg    Examination:  General exam: Appears calm and comfortable.  Patient is obese. Respiratory system: Decreased air entry.   Cardiovascular system: S1 & S2 heard Gastrointestinal system: Abdomen is morbidly obese, soft and nontender.   Central nervous system: Cannot alert.  Patient moves all extremities. Extremities: Bilateral lower extremity edema, is improving.  Data Reviewed: I have personally reviewed following labs and imaging studies  CBC: Recent Labs  Lab 10/29/22 1325 10/30/22 0017 10/31/22 0016 11/01/22 0021 11/02/22 0016  WBC 13.3* 12.4* 11.9* 12.4* 12.6*  HGB 8.2* 7.0* 7.5* 7.4* 7.7*  HCT 27.3* 23.3* 25.0* 24.8* 26.8*  MCV 95.8 97.1 95.1 96.9 100.0  PLT 375 335 365 363 355    Basic Metabolic Panel: Recent Labs  Lab 10/29/22 1325 10/30/22 0017 10/31/22 0016 11/01/22 0021 11/02/22 0016  NA 137 137 137 136 136  K 4.2 4.3 4.2 4.0 4.5  CL 98 96* 99 99 99  CO2 25 26 25 27 26   GLUCOSE 154* 176* 163* 162* 230*  BUN 73* 67* 67* 62* 67*  CREATININE 3.26* 3.34* 3.18* 2.95* 3.37*  CALCIUM 8.4* 8.5* 8.7* 8.8* 8.7*  MG 2.1 2.0 2.1 2.1 2.2  PHOS 6.2* 6.3* 5.7* 5.4*  6.6*    GFR: Estimated Creatinine Clearance: 15.6 mL/min (A) (by C-G formula based on SCr of 3.37 mg/dL (H)). Liver Function Tests: Recent Labs  Lab 10/29/22 1325 10/30/22 0017 10/31/22 0016 11/01/22 0021 11/02/22 0016  ALBUMIN 2.4* 2.0* 2.4* 2.3* 2.2*    No results for input(s): "LIPASE", "AMYLASE" in the last 168 hours. No results for input(s): "AMMONIA" in the last 168 hours. Coagulation Profile: No results for input(s): "INR", "PROTIME" in the last 168 hours. Cardiac Enzymes: No results for input(s): "CKTOTAL", "CKMB", "CKMBINDEX", "TROPONINI" in the last 168 hours. BNP (last 3 results) Recent Labs    02/16/22 1008  PROBNP 699    HbA1C: No results for input(s): "HGBA1C" in the last 72 hours. CBG: Recent Labs  Lab 11/01/22 1124 11/01/22 1529 11/01/22 2113 11/02/22 0618 11/02/22 1057  GLUCAP 196* 219* 238* 170* 253*    Lipid Profile: No results for input(s): "CHOL", "HDL", "LDLCALC", "TRIG", "CHOLHDL", "LDLDIRECT" in the last 72 hours. Thyroid Function Tests: No results for input(s): "TSH", "T4TOTAL", "FREET4", "T3FREE", "THYROIDAB" in the last 72 hours. Anemia Panel: No results for input(s): "VITAMINB12", "FOLATE", "FERRITIN", "TIBC", "IRON", "RETICCTPCT" in the last 72 hours. Urine analysis:    Component Value Date/Time   COLORURINE YELLOW 07/22/2021 0011   APPEARANCEUR CLOUDY (A) 07/22/2021 0011   LABSPEC  1.023 07/22/2021 0011   PHURINE 5.0 07/22/2021 0011   GLUCOSEU 50 (A) 07/22/2021 0011   GLUCOSEU NEGATIVE 12/09/2018 0944   HGBUR NEGATIVE 07/22/2021 0011   BILIRUBINUR NEGATIVE 07/22/2021 0011   KETONESUR NEGATIVE 07/22/2021 0011   PROTEINUR >=300 (A) 07/22/2021 0011   UROBILINOGEN 0.2 12/09/2018 0944   NITRITE NEGATIVE 07/22/2021 0011   LEUKOCYTESUR NEGATIVE 07/22/2021 0011   Sepsis Labs: @LABRCNTIP (procalcitonin:4,lacticidven:4)  ) Recent Results (from the past 240 hour(s))  Culture, blood (routine x 2)     Status: None   Collection  Time: 10/25/22  8:24 AM   Specimen: BLOOD LEFT ARM  Result Value Ref Range Status   Specimen Description BLOOD LEFT ARM  Final   Special Requests   Final    BOTTLES DRAWN AEROBIC AND ANAEROBIC Blood Culture adequate volume   Culture   Final    NO GROWTH 5 DAYS Performed at [facility], [address], [place], [place] [address]    Report Status 10/30/2022 FINAL  Final  Culture, blood (routine x 2)     Status: None   Collection Time: 10/25/22  8:26 AM   Specimen: BLOOD  Result Value Ref Range Status   Specimen Description BLOOD SITE NOT SPECIFIED  Final   Special Requests   Final    BOTTLES DRAWN AEROBIC AND ANAEROBIC Blood Culture results may not be optimal due to an inadequate volume of blood received in culture bottles   Culture   Final    NO GROWTH 5 DAYS Performed at [facility], [address], [place], [place] [address]    Report Status 10/30/2022 FINAL  Final  MRSA Next Gen by PCR, Nasal     Status: None   Collection Time: 10/25/22 11:46 AM   Specimen: Nasal Mucosa; Nasal Swab  Result Value Ref Range Status   MRSA by PCR Next Gen NOT DETECTED NOT DETECTED Final    Comment: (NOTE) The GeneXpert MRSA Assay (FDA approved for NASAL specimens only), is one component of a comprehensive MRSA colonization surveillance program. It is not intended to diagnose MRSA infection nor to guide or monitor treatment for MRSA infections. Test performance is not FDA approved in patients less than 27 years old. Performed at [facility], [address] [place], [place] [address]          Radiology Studies: DG Ribs Unilateral Left  Result Date: 11/01/2022 CLINICAL DATA:  Left rib fracture. EXAM: LEFT RIBS - 2 VIEW COMPARISON:  Chest x-ray 10/31/2022 FINDINGS: Minimally displaced posterolateral left seventh rib fracture. Remainder of the exam is unchanged. IMPRESSION: Minimally displaced posterolateral left seventh rib fracture. Electronically Signed   By: Elberta Fortis M.D.   On: 11/01/2022 15:23   VAS US RENAL ARTERY DUPLEX  Result Date: 11/01/2022 ABDOMINAL VISCERAL Patient Name:  Kayla [name]  Date of Exam:   11/01/2022 Medical Rec #: [id number]         Accession #:    [id number] Date of Birth: 1940/12/03        Patient Gender: F Patient Age:   67 years Exam Location:  [facility] Procedure:      VAS US RENAL ARTERY DUPLEX Referring Phys: [id number] Cosimo Schertzer I Daeshaun Specht -------------------------------------------------------------------------------- Indications: Stenosis, renal I70.1 High Risk Factors: Hypertension, hyperlipidemia, Diabetes. Limitations: Air/bowel gas, obesity, patient discomfort and patient positioning, patient constant movement, respiratory disturbance. Comparison Study: No prior studies. Performing Technologist: Chanda Busing RVT  Examination Guidelines: A complete evaluation includes B-mode imaging,  spectral Doppler, color Doppler, and power Doppler as needed of all accessible portions of each vessel. Bilateral testing is considered an integral part of a complete examination. Limited examinations for reoccurring indications may be performed as noted.  Duplex Findings: +--------------------+--------+--------+------+------------------+ Mesenteric          PSV cm/sEDV cm/sPlaque     Comments      +--------------------+--------+--------+------+------------------+ Aorta Mid             221      22                            +--------------------+--------+--------+------+------------------+ Celiac Artery Origin                      Unable to insonate +--------------------+--------+--------+------+------------------+ SMA Origin                                Unable to insonate +--------------------+--------+--------+------+------------------+    +------------------+--------+--------+-------+ Right Renal ArteryPSV cm/sEDV cm/sComment +------------------+--------+--------+-------+ Origin               19      8             +------------------+--------+--------+-------+ Proximal             24      8            +------------------+--------+--------+-------+ Mid                  42      9            +------------------+--------+--------+-------+ Distal               37      10           +------------------+--------+--------+-------+ +-----------------+--------+--------+-------+ Left Renal ArteryPSV cm/sEDV cm/sComment +-----------------+--------+--------+-------+ Origin              59      9            +-----------------+--------+--------+-------+ Proximal            70      5            +-----------------+--------+--------+-------+ Mid                 67      10           +-----------------+--------+--------+-------+ Distal              41      6            +-----------------+--------+--------+-------+ +------------+--------+--------+----+-----------+--------+--------+----+ Right KidneyPSV cm/sEDV cm/sRI  Left KidneyPSV cm/sEDV cm/sRI   +------------+--------+--------+----+-----------+--------+--------+----+ Upper Pole  29      5       0.82Upper Pole 23      4       0.82 +------------+--------+--------+----+-----------+--------+--------+----+ Mid         27      4       0.        21      5       0.77 +------------+--------+--------+----+-----------+--------+--------+----+ Lower Pole  22      4       0.80Lower Pole 25      6       0.76 +------------+--------+--------+----+-----------+--------+--------+----+ Hilar       43  8       0.81Hilar      28      6       0.78 +------------+--------+--------+----+-----------+--------+--------+----+ +------------------+----+------------------+-----+ Right Kidney          Left Kidney             +------------------+----+------------------+-----+ RAR                   RAR                     +------------------+----+------------------+-----+ RAR (manual)      0.19RAR (manual)      0.32   +------------------+----+------------------+-----+ Cortex                Cortex                  +------------------+----+------------------+-----+ Cortex thickness      Corex thickness         +------------------+----+------------------+-----+ Kidney length (cm)9.30Kidney length (cm)10.00 +------------------+----+------------------+-----+  Summary: Renal:  Right: Normal size right kidney. Normal right Resisitive Index. No        evidence of right renal artery stenosis. Left:  No evidence of left renal artery stenosis. Normal size of        left kidney. Normal left Resistive Index.  *See table(s) above for measurements and observations.  Diagnosing physician: Coral Else MD  Electronically signed by Coral Else MD on 11/01/2022 at 11:17:34 AM.    Final         Scheduled Meds:  amLODipine  10 mg Oral Daily   apixaban  2.5 mg Oral BID   atorvastatin  40 mg Oral Daily   bisacodyl  10 mg Rectal Daily   Or   bisacodyl  10 mg Oral Daily   darbepoetin (ARANESP) injection - NON-DIALYSIS  60 mcg Subcutaneous Q Wed-1800   guaiFENesin  600 mg Oral BID   insulin aspart  0-6 Units Subcutaneous TID WC   isosorbide mononitrate  30 mg Oral Daily   levothyroxine  200 mcg Oral Q0600   lidocaine  1 patch Transdermal Q24H   multivitamin  1 tablet Per Tube QHS   mouth rinse  15 mL Mouth Rinse 4 times per day   sodium chloride flush  3 mL Intravenous Q12H   Continuous Infusions:  sodium chloride Stopped (10/26/22 1934)     LOS: 8 days    Time spent: 35 minutes.    Berton Mount, MD  [facility] Pager #: [phone] 7PM-7AM contact night coverage as above

## 2022-11-02 NOTE — Progress Notes (Signed)
Mobility Specialist Progress Note:   11/02/22 1648  Mobility  Activity Ambulated with assistance in hallway  Level of Assistance Contact guard assist, steadying assist  Assistive Device Front wheel walker  Distance Ambulated (ft) 60 ft  Activity Response Tolerated well  Mobility Referral Yes  $Mobility charge 1 Mobility  Mobility Specialist Start Time (ACUTE ONLY) 1625  Mobility Specialist Stop Time (ACUTE ONLY) 1635  Mobility Specialist Time Calculation (min) (ACUTE ONLY) 10 min    Pre Mobility: 43 HR  Post Mobility: 46 HR   Pt received in bed, agreeable to mobility. Seated rest break required mid session due to fatigue and dizziness. Pt rated dizziness 8/10. Pt returned to bed with VSS and call bell in hand.     Leory Plowman  Mobility Specialist Please contact via [employer] office at [phone]

## 2022-11-02 NOTE — Progress Notes (Addendum)
Palliative Care Progress Note, Assessment & Plan   Patient Name: Kayla [name]       Date: 11/02/2022 DOB: July 14, 1940  Age: 82 y.o. MRN#: [id number] Attending Physician: Barnetta Chapel, MD Primary Care Physician: Ailene Ravel, MD Admit Date: 10/25/2022  Subjective: Patient is out of bed and sitting in recliner.  She complains of pain in her "backside".  Patient's husband is at bedside.  HPI: 82 y.o. female  with past medical history of osteomyelitis, type 2 diabetes with diabetic polyneuropathy, SSS with pacemaker, HLD, chronic venous insufficiency, obesity, COPD, gout, iron deficiency anemia, HTN, proximal A-fib, discectomy and fusion of C4-7 (2016), CKD (stage IV), CHF, CAD, laparoscopic cholecystectomy (2017) and recent MRSA bacteremia/infective endocarditis admitted on 10/25/2022 with SOB.    Patient is being treated for pulmonary edema, respiratory failure, junctional bradycardia, AKI, and hyperkalemia.   PMT was consulted to discuss goals of care.  Summary of counseling/coordination of care: After reviewing the patient's chart and assessing the patient at bedside, I discussed symptom management and plan of care with patient and her husband.  Symptoms assessed.  Patient endorses she is in pain.  However, she does not recall that she was just given a pain pill.  We reviewed that pain medicine remains available every 4 hours as needed.  I highlighted that patient must be asking for pain medicine in order for it to be given.  It is not scheduled.  Patient's husband endorsed understanding.  Brief life review completed.  Patient and husband have been married for over 65 years.  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  I attempted to  elicit values and goals important to the patient.  Patient states she is ready to go home.  She inquires about home health and pain management.  I assured her that [facility] is following closely and that patient can continue to attend pain clinic in [place].  Advance care planning, code status, and artificial hydration/nutrition discussed.  Patient and patient's husband endorses that she would not want to ever receive resuscitative measures.  However, she wants "everything and anything else" done up until that point.  Her husband shares he wants to "keep her alive as long as possible".  Patient's husband becomes tearful when discussing advanced care planning.  He shares he wants to do what he can to make sure she is free from pain and that they can be together as long as possible.  Therapeutic silence, active listening, and emotional support provided.  DNR with limited interventions remains. Treat the treatable with hopes of discharge to home soon.   I encouraged patient to be more vocal if she is in pain as she has not been utilizing her pain medicine consistently.  Patient husband shares he will help remind her.  PMT will continue to follow but monitor the patient peripherally.  Physical Exam Vitals reviewed.  Constitutional:      General: She is not in acute distress.    Appearance: She is obese.  HENT:     Head: Normocephalic.     Mouth/Throat:     Mouth: Mucous membranes are moist.  Eyes:  Pupils: Pupils are equal, round, and reactive to light.  Cardiovascular:     Rate and Rhythm: Bradycardia present.  Pulmonary:     Effort: Pulmonary effort is normal.  Abdominal:     Palpations: Abdomen is soft.  Musculoskeletal:     Comments: Generalized weakness  Neurological:     Mental Status: She is alert and oriented to person, place, and time.  Psychiatric:        Mood and Affect: Mood normal.        Behavior: Behavior normal.        Thought Content: Thought content normal.         Judgment: Judgment normal.             Total Time 35 minutes   Michi Herrmann L. Manon Hilding, FNP-BC Palliative Medicine Team Team Phone # [phone]

## 2022-11-02 NOTE — Progress Notes (Signed)
Patient ID: Kayla [name], female   DOB: May 28, 1940, 82 y.o.   MRN: [id number] S:No new complaints O:BP (!) 142/46   Pulse (!) 41   Temp (!) 97.4 F (36.3 C) (Oral)   Resp 14   Ht 5\' 5"  (1.651 m)   Wt 103.6 kg   SpO2 93%   BMI 38.01 kg/m   Intake/Output Summary (Last 24 hours) at 11/02/2022 1007 Last data filed at 11/01/2022 2039 Gross per 24 hour  Intake 100 ml  Output 177 ml  Net -77 ml   Intake/Output: I/O last 3 completed shifts: In: 100 [P.O.:100] Out: 677 [Urine:676; Stool:1]  Intake/Output this shift:  No intake/output data recorded. Weight change:  Gen: NAD CVS: bradycardic at 41 Resp: decreased BS at bases Abd: +BS, soft, NT/ND Ext: no edema  Recent Labs  Lab 10/27/22 1526 10/28/22 0500 10/29/22 1325 10/30/22 0017 10/31/22 0016 11/01/22 0021 11/02/22 0016  NA 137 137 137 137 137 136 136  K 4.2 4.1 4.2 4.3 4.2 4.0 4.5  CL 102 99 98 96* 99 99 99  CO2 25 26 25 26 25 27 26   GLUCOSE 208* 100* 154* 176* 163* 162* 230*  BUN 57* 60* 73* 67* 67* 62* 67*  CREATININE 2.81* 3.04* 3.26* 3.34* 3.18* 2.95* 3.37*  ALBUMIN 2.1* 2.1* 2.4* 2.0* 2.4* 2.3* 2.2*  CALCIUM 7.9* 8.3* 8.4* 8.5* 8.7* 8.8* 8.7*  PHOS 4.5 5.2* 6.2* 6.3* 5.7* 5.4* 6.6*   Liver Function Tests: Recent Labs  Lab 10/31/22 0016 11/01/22 0021 11/02/22 0016  ALBUMIN 2.4* 2.3* 2.2*   No results for input(s): "LIPASE", "AMYLASE" in the last 168 hours. No results for input(s): "AMMONIA" in the last 168 hours. CBC: Recent Labs  Lab 10/29/22 1325 10/30/22 0017 10/31/22 0016 11/01/22 0021 11/02/22 0016  WBC 13.3* 12.4* 11.9* 12.4* 12.6*  HGB 8.2* 7.0* 7.5* 7.4* 7.7*  HCT 27.3* 23.3* 25.0* 24.8* 26.8*  MCV 95.8 97.1 95.1 96.9 100.0  PLT 375 335 365 363 355   Cardiac Enzymes: No results for input(s): "CKTOTAL", "CKMB", "CKMBINDEX", "TROPONINI" in the last 168 hours. CBG: Recent Labs  Lab 11/01/22 0628 11/01/22 1124 11/01/22 1529 11/01/22 2113 11/02/22 0618  GLUCAP 147* 196* 219* 238*  170*    Iron Studies: No results for input(s): "IRON", "TIBC", "TRANSFERRIN", "FERRITIN" in the last 72 hours. Studies/Results: DG Ribs Unilateral Left  Result Date: 11/01/2022 CLINICAL DATA:  Left rib fracture. EXAM: LEFT RIBS - 2 VIEW COMPARISON:  Chest x-ray 10/31/2022 FINDINGS: Minimally displaced posterolateral left seventh rib fracture. Remainder of the exam is unchanged. IMPRESSION: Minimally displaced posterolateral left seventh rib fracture. Electronically Signed   By: Elberta Fortis M.D.   On: 11/01/2022 15:23   VAS US RENAL ARTERY DUPLEX  Result Date: 11/01/2022 ABDOMINAL VISCERAL Patient Name:  Kayla [name]  Date of Exam:   11/01/2022 Medical Rec #: [id number]         Accession #:    [id number] Date of Birth: February 18, 1941        Patient Gender: F Patient Age:   8 years Exam Location:  [facility] Procedure:      VAS US RENAL ARTERY DUPLEX Referring Phys: [id number] SYLVESTER I OGBATA -------------------------------------------------------------------------------- Indications: Stenosis, renal I70.1 High Risk Factors: Hypertension, hyperlipidemia, Diabetes. Limitations: Air/bowel gas, obesity, patient discomfort and patient positioning, patient constant movement, respiratory disturbance. Comparison Study: No prior studies. Performing Technologist: Chanda Busing RVT  Examination Guidelines: A complete evaluation includes B-mode imaging, spectral Doppler, color Doppler, and power Doppler as needed  of all accessible portions of each vessel. Bilateral testing is considered an integral part of a complete examination. Limited examinations for reoccurring indications may be performed as noted.  Duplex Findings: +--------------------+--------+--------+------+------------------+ Mesenteric          PSV cm/sEDV cm/sPlaque     Comments      +--------------------+--------+--------+------+------------------+ Aorta Mid             221      22                             +--------------------+--------+--------+------+------------------+ Celiac Artery Origin                      Unable to insonate +--------------------+--------+--------+------+------------------+ SMA Origin                                Unable to insonate +--------------------+--------+--------+------+------------------+    +------------------+--------+--------+-------+ Right Renal ArteryPSV cm/sEDV cm/sComment +------------------+--------+--------+-------+ Origin               19      8            +------------------+--------+--------+-------+ Proximal             24      8            +------------------+--------+--------+-------+ Mid                  42      9            +------------------+--------+--------+-------+ Distal               37      10           +------------------+--------+--------+-------+ +-----------------+--------+--------+-------+ Left Renal ArteryPSV cm/sEDV cm/sComment +-----------------+--------+--------+-------+ Origin              59      9            +-----------------+--------+--------+-------+ Proximal            70      5            +-----------------+--------+--------+-------+ Mid                 67      10           +-----------------+--------+--------+-------+ Distal              41      6            +-----------------+--------+--------+-------+ +------------+--------+--------+----+-----------+--------+--------+----+ Right KidneyPSV cm/sEDV cm/sRI  Left KidneyPSV cm/sEDV cm/sRI   +------------+--------+--------+----+-----------+--------+--------+----+ Upper Pole  29      5       0.82Upper Pole 23      4       0.82 +------------+--------+--------+----+-----------+--------+--------+----+ Mid         27      4       0.        21      5       0.77 +------------+--------+--------+----+-----------+--------+--------+----+ Lower Pole  22      4       0.80Lower Pole 25      6       0.76  +------------+--------+--------+----+-----------+--------+--------+----+ Hilar       43      8  0.81Hilar      28      6       0.78 +------------+--------+--------+----+-----------+--------+--------+----+ +------------------+----+------------------+-----+ Right Kidney          Left Kidney             +------------------+----+------------------+-----+ RAR                   RAR                     +------------------+----+------------------+-----+ RAR (manual)      0.19RAR (manual)      0.32  +------------------+----+------------------+-----+ Cortex                Cortex                  +------------------+----+------------------+-----+ Cortex thickness      Corex thickness         +------------------+----+------------------+-----+ Kidney length (cm)9.30Kidney length (cm)10.00 +------------------+----+------------------+-----+  Summary: Renal:  Right: Normal size right kidney. Normal right Resisitive Index. No        evidence of right renal artery stenosis. Left:  No evidence of left renal artery stenosis. Normal size of        left kidney. Normal left Resistive Index.  *See table(s) above for measurements and observations.  Diagnosing physician: Coral Else MD  Electronically signed by Coral Else MD on 11/01/2022 at 11:17:34 AM.    Final     amLODipine  10 mg Oral Daily   apixaban  2.5 mg Oral BID   atorvastatin  40 mg Oral Daily   bisacodyl  10 mg Rectal Daily   Or   bisacodyl  10 mg Oral Daily   darbepoetin (ARANESP) injection - NON-DIALYSIS  60 mcg Subcutaneous Q Wed-1800   furosemide  80 mg Intravenous BID   guaiFENesin  600 mg Oral BID   insulin aspart  0-6 Units Subcutaneous TID WC   isosorbide mononitrate  30 mg Oral Daily   levothyroxine  200 mcg Oral Q0600   lidocaine  1 patch Transdermal Q24H   multivitamin  1 tablet Per Tube QHS   mouth rinse  15 mL Mouth Rinse 4 times per day   sodium chloride flush  3 mL Intravenous Q12H    BMET     Component Value Date/Time   NA 136 11/02/2022 0016   NA 140 02/16/2022 1008   K 4.5 11/02/2022 0016   CL 99 11/02/2022 0016   CO2 26 11/02/2022 0016   GLUCOSE 230 (H) 11/02/2022 0016   BUN 67 (H) 11/02/2022 0016   BUN 47 (H) 02/16/2022 1008   CREATININE 3.37 (H) 11/02/2022 0016   CREATININE 2.10 (H) 10/07/2022 0939   CALCIUM 8.7 (L) 11/02/2022 0016   CALCIUM 9.9 06/04/2008 2240   GFRNONAA 13 (L) 11/02/2022 0016   GFRAA 45 (L) 02/16/2020 1124   CBC    Component Value Date/Time   WBC 12.6 (H) 11/02/2022 0016   RBC 2.68 (L) 11/02/2022 0016   HGB 7.7 (L) 11/02/2022 0016   HGB 11.3 01/30/2021 1154   HCT 26.8 (L) 11/02/2022 0016   HCT 35.9 01/30/2021 1154   PLT 355 11/02/2022 0016   PLT 389 01/30/2021 1154   MCV 100.0 11/02/2022 0016   MCV 91 01/30/2021 1154   MCH 28.7 11/02/2022 0016   MCHC 28.7 (L) 11/02/2022 0016   RDW 18.1 (H) 11/02/2022 0016   RDW 13.4 01/30/2021 1154   LYMPHSABS 1.4 10/25/2022 0730   MONOABS 1.3 (  H) 10/25/2022 0730   EOSABS 0.1 10/25/2022 0730   BASOSABS 0.0 10/25/2022 0730     Assessment/Plan: Acute kidney injury on CKD stage IV likely due to CHF exacerbation/fluid overload/bradycardia leading to reduced renal perfusion.  B/L Cr around 2, followed by me as an outpatient. She had hyperkalemia, anasarca and did not respond with IV diuretics.  Also had features of uremic encephalopathy including tremor, AMS. -temp HD catheter placed on 6/23, started CRRT 6/23. CRRT off on 6/24 given access issues/alarms. RIJ temp line removed 6/26. Uremic symptoms improved at that time -Cr down to 2.95 yesterday but rose to 3.37 this morning. Would recommend holding IV lasix 80mg  IV BID for now and follow UOP and BUN/Cr.  -no indications for HD/CRRT at this time-volume status better and not exhibiting any uremic signs/symptoms. Side note: discussed with patient and husband and she would want to pursue dialysis in the long term. She is interested in home therapies down the  road if/when indicated.  -normal renal artery duplex today, no evidence of RAS -daily labs, strict I/O -hold off on discharge for now until Scr stabilizes.    Hyperkalemia: Received medical treatment in the ER.  Improved with CRRT (currently off). On lasix + renal diet. K WNL now   Bradycardia: Seen by cardiology during admit. H/o PPM extraction secondary to bacteremia (Sep 07, 2022).  Off dopamine gtt, HR stable. Avoid beta blockers   Acute on chronic respiratory failure/respiratory distress with hypoxia: lasix as above. CXR 6/25 revealed improvement in opacities. On 3L [place] at baseline @ home -CXR 6/29 personally reviewed: +vascular congestion -c/w lasix 80mg  IV BID for now.   Sepsis due to possible pneumonia: On antibiotics per primary team.   Hyponatremia, mild: likely secondary to AKI and hypervolemia. WNL now   Anemia: transfuse for Hgb <7. ESA start 6/26. Feraheme x 1 dose 6/28   CKD MBD: PO4 acceptable, c/w renal diet  Irena Cords, MD [facility]

## 2022-11-02 NOTE — TOC Progression Note (Signed)
Transition of Care [facility]) - Progression Note    Patient Details  Name: Kayla [name] MRN: [id number] Date of Birth: 04/15/1941  [facility] CM/SW Contact  Harriet Masson, RN Phone Number: 11/02/2022, 2:44 PM  Clinical Narrative:    Patient was active with [facility] for RN, PT.  Will need resumption home health orders.  Orders for wheelchair. Notified zack with [employer] of WC order. Husband will transport home at discharge.  TOC following.   Expected Discharge Plan: Home w Home Health Services Barriers to Discharge: Continued Medical Work up  Expected Discharge Plan and Services   Discharge Planning Services: CM Consult Post Acute Care Choice: Home Health Living arrangements for the past 2 months: Single Family Home                 DME Arranged: Wheelchair manual DME Agency: [employer] Date DME Agency Contacted: 11/02/22 Time DME Agency Contacted: 1444 Representative spoke with at DME Agency: Zack HH Arranged: RN, PT HH Agency: [facility] Date [facility] Agency Contacted: 10/29/22 Time HH Agency Contacted: 1525 Representative spoke with at [facility] Agency: Haywood Lasso   Social Determinants of Health (SDOH) Interventions SDOH Screenings   Food Insecurity: No Food Insecurity (10/01/2022)  Housing: Patient Declined (10/01/2022)  Transportation Needs: No Transportation Needs (10/01/2022)  Utilities: Not At Risk (10/01/2022)  Alcohol Screen: Low Risk  (01/31/2021)  Depression (PHQ2-9): Low Risk  (10/15/2022)  Financial Resource Strain: Low Risk  (01/31/2021)  Physical Activity: Insufficiently Active (09/30/2021)  Social Connections: Socially Integrated (09/30/2021)  Stress: No Stress Concern Present (09/30/2021)  Tobacco Use: Medium Risk (10/25/2022)    Readmission Risk Interventions    10/01/2022    3:52 PM 01/22/2022    2:43 PM 07/23/2021   12:10 PM  Readmission Risk Prevention Plan  Transportation Screening Complete Complete Complete  PCP or Specialist Appt within 3-5  Days Complete Complete   HRI or Home Care Consult  Complete   Social Work Consult for Recovery Care Planning/Counseling Complete Complete   Palliative Care Screening Not Applicable Not Applicable   Medication Review Oceanographer) Referral to Pharmacy Complete Complete  HRI or Home Care Consult   Complete  SW Recovery Care/Counseling Consult   Complete  Palliative Care Screening   Not Applicable  Skilled Nursing Facility   Complete

## 2022-11-02 NOTE — Progress Notes (Addendum)
Physical Therapy Treatment Patient Details Name: Kayla [name] MRN: [id number] DOB: 1941-04-08 Today's Date: 11/02/2022   History of Present Illness Pt is 82 year old female brought to the ED 10/25/22 for SoB and AMS. Found to be in respiratory distress, HR 30s. ABG pH 7.1 with high pCO2 and low bicarbonate, Alo noted to have AKI. CRRT 6/23-6/24  PMH: obesity, type 2 diabetes, hypertension, hyperlipidemia, CAD, congestive heart failure, paroxysmal atrial fibrillation (on Eliquis), sick sinus syndrome status post pacemaker, CKD, anemia, COPD, GERD, hypothyroidism, chronic venous insufficiency, status post left first ray amputation (09/04/2022) with active osteomyelitis    PT Comments  Pt seated in recliner.  She required 4LNC.  No buckling noted.  Talked to patient and husband about use of WC  in home to manage and reduce falls.  Pt continues to benefit from HHPT and heavy support from spouse at home.       Assistance Recommended at Discharge Frequent or constant Supervision/Assistance  If plan is discharge home, recommend the following:  Can travel by private vehicle    A little help with walking and/or transfers;A little help with bathing/dressing/bathroom;Assistance with cooking/housework;Direct supervision/assist for medications management;Direct supervision/assist for financial management;Assist for transportation;Help with stairs or ramp for entrance      Equipment Recommendations  Rolling walker (2 wheels);Wheelchair cushion (measurements PT);Wheelchair (measurements PT)    Recommendations for Other Services       Precautions / Restrictions Precautions Precautions: Fall Precaution Comments: 2x falls in week before hospitalization, with multiple falls in past year, post surgery pt to wear post op shoe, messaged MD and no longer needs to use. Restrictions Weight Bearing Restrictions: No     Mobility  Bed Mobility               General bed mobility comments: Seated in  recliner.    Transfers Overall transfer level: Needs assistance Equipment used: Rolling walker (2 wheels) Transfers: Sit to/from Stand, Bed to chair/wheelchair/BSC Sit to Stand: Min guard   Step pivot transfers: Min guard       General transfer comment: Pt required cues for hand placement to rise into standing.  Pt required cues to reach back for seated surface and presents with poor eccentric load.    Ambulation/Gait Ambulation/Gait assistance: Min assist Gait Distance (Feet): 8 Feet (+ 20 ft + 22 ft) Assistive device: Rolling walker (2 wheels) Gait Pattern/deviations: Step-through pattern, Trunk flexed, Shuffle Gait velocity: slowed     General Gait Details: NO signs of buckling this session.  Continues to require close chair follow.  Pt continues to fatigue quickly.   Stairs             Wheelchair Mobility     Tilt Bed    Modified Rankin (Stroke Patients Only)       Balance Overall balance assessment: Needs assistance Sitting-balance support: Feet supported, No upper extremity supported Sitting balance-Leahy Scale: Fair       Standing balance-Leahy Scale: Poor Standing balance comment: requires at least single UE support for dynamic standing                            Cognition Arousal/Alertness: Awake/alert Behavior During Therapy: Agitated, WFL for tasks assessed/performed Overall Cognitive Status: Impaired/Different from baseline Area of Impairment: Following commands, Safety/judgement, Awareness, Problem solving                       Following Commands: Follows one  step commands with increased time Safety/Judgement: Decreased awareness of safety, Decreased awareness of deficits Awareness: Emergent Problem Solving: Slow processing, Requires verbal cues, Requires tactile cues General Comments: Continues to voice she is adamant to return home despite decreased strength and history of falls at home.        Exercises       General Comments        Pertinent Vitals/Pain Pain Assessment Pain Assessment: No/denies pain Pain Score: 9  Pain Location: ribs Pain Descriptors / Indicators: Discomfort, Grimacing, Guarding Pain Intervention(s): Monitored during session, Repositioned    Home Living                          Prior Function            PT Goals (current goals can now be found in the care plan section) Acute Rehab PT Goals PT Goal Formulation: With patient/family Time For Goal Achievement: 11/13/22 Potential to Achieve Goals: Fair Progress towards PT goals: Progressing toward goals    Frequency    Min 1X/week      PT Plan Discharge plan needs to be updated    Co-evaluation              AM-PAC PT "6 Clicks" Mobility   Outcome Measure  Help needed turning from your back to your side while in a flat bed without using bedrails?: None Help needed moving from lying on your back to sitting on the side of a flat bed without using bedrails?: None Help needed moving to and from a bed to a chair (including a wheelchair)?: A Little Help needed standing up from a chair using your arms (e.g., wheelchair or bedside chair)?: A Little Help needed to walk in hospital room?: A Little Help needed climbing 3-5 steps with a railing? : A Lot 6 Click Score: 19    End of Session Equipment Utilized During Treatment: Gait belt;Oxygen Activity Tolerance: Patient tolerated treatment well Patient left: in chair;with call bell/phone within reach;with family/visitor present;with chair alarm set Nurse Communication: Mobility status PT Visit Diagnosis: Unsteadiness on feet (R26.81);Other abnormalities of gait and mobility (R26.89);Repeated falls (R29.6);History of falling (Z91.81);Muscle weakness (generalized) (M62.81);Difficulty in walking, not elsewhere classified (R26.2);Pain;Dizziness and giddiness (R42) Pain - part of body:  (ribs)     Time: 1610-9604 PT Time Calculation (min) (ACUTE  ONLY): 42 min  Charges:    $Gait Training: 8-22 mins $Therapeutic Activity: 23-37 mins PT General Charges $$ ACUTE PT VISIT: 1 Visit                     Bonney Leitz , PTA Acute Rehabilitation Services Office [phone]    Jaser Fullen Artis Delay 11/02/2022, 1:22 PM

## 2022-11-02 NOTE — Progress Notes (Signed)
Placed patient on home CPAP for the night with oxygen set at 4lpm.  

## 2022-11-03 DIAGNOSIS — J9602 Acute respiratory failure with hypercapnia: Secondary | ICD-10-CM | POA: Diagnosis not present

## 2022-11-03 DIAGNOSIS — J9601 Acute respiratory failure with hypoxia: Secondary | ICD-10-CM | POA: Diagnosis not present

## 2022-11-03 LAB — CBC
HCT: 27.6 % — ABNORMAL LOW (ref 36.0–46.0)
Hemoglobin: 8 g/dL — ABNORMAL LOW (ref 12.0–15.0)
MCH: 28.9 pg (ref 26.0–34.0)
MCHC: 29 g/dL — ABNORMAL LOW (ref 30.0–36.0)
MCV: 99.6 fL (ref 80.0–100.0)
Platelets: 322 10*3/uL (ref 150–400)
RBC: 2.77 MIL/uL — ABNORMAL LOW (ref 3.87–5.11)
RDW: 18.1 % — ABNORMAL HIGH (ref 11.5–15.5)
WBC: 13.5 10*3/uL — ABNORMAL HIGH (ref 4.0–10.5)
nRBC: 0.5 % — ABNORMAL HIGH (ref 0.0–0.2)

## 2022-11-03 LAB — URINALYSIS, ROUTINE W REFLEX MICROSCOPIC
Bilirubin Urine: NEGATIVE
Glucose, UA: NEGATIVE mg/dL
Hgb urine dipstick: NEGATIVE
Ketones, ur: NEGATIVE mg/dL
Nitrite: NEGATIVE
Protein, ur: >=300 mg/dL — AB
Specific Gravity, Urine: 1.019 (ref 1.005–1.030)
WBC, UA: >50 WBC/hpf (ref 0–5)
pH: 5 (ref 5.0–8.0)

## 2022-11-03 LAB — MAGNESIUM: Magnesium: 2.2 mg/dL (ref 1.7–2.4)

## 2022-11-03 LAB — RENAL FUNCTION PANEL
Albumin: 2.3 g/dL — ABNORMAL LOW (ref 3.5–5.0)
Anion gap: 12 (ref 5–15)
BUN: 73 mg/dL — ABNORMAL HIGH (ref 8–23)
CO2: 25 mmol/L (ref 22–32)
Calcium: 8.6 mg/dL — ABNORMAL LOW (ref 8.9–10.3)
Chloride: 99 mmol/L (ref 98–111)
Creatinine, Ser: 3.89 mg/dL — ABNORMAL HIGH (ref 0.44–1.00)
GFR, Estimated: 11 mL/min — ABNORMAL LOW (ref >60)
Glucose, Bld: 198 mg/dL — ABNORMAL HIGH (ref 70–99)
Phosphorus: 6.4 mg/dL — ABNORMAL HIGH (ref 2.5–4.6)
Potassium: 4.5 mmol/L (ref 3.5–5.1)
Sodium: 136 mmol/L (ref 135–145)

## 2022-11-03 LAB — GLUCOSE, CAPILLARY
Glucose-Capillary: 185 mg/dL — ABNORMAL HIGH (ref 70–99)
Glucose-Capillary: 184 mg/dL — ABNORMAL HIGH (ref 70–99)
Glucose-Capillary: 189 mg/dL — ABNORMAL HIGH (ref 70–99)
Glucose-Capillary: 225 mg/dL — ABNORMAL HIGH (ref 70–99)

## 2022-11-03 LAB — SODIUM, URINE, RANDOM: Sodium, Ur: 30 mmol/L

## 2022-11-03 LAB — CREATININE, URINE, RANDOM: Creatinine, Urine: 161 mg/dL

## 2022-11-03 MED ORDER — SEVELAMER CARBONATE 800 MG PO TABS
800.0000 mg | ORAL_TABLET | Freq: Three times a day (TID) | ORAL | Status: DC
  Administered 2022-11-03 – 2022-11-14 (×28): 800 mg via ORAL
  Filled 2022-11-03 (×30): qty 1

## 2022-11-03 MED ORDER — TORSEMIDE 20 MG PO TABS
40.0000 mg | ORAL_TABLET | Freq: Every day | ORAL | Status: DC
  Administered 2022-11-03 – 2022-11-06 (×4): 40 mg via ORAL
  Filled 2022-11-03 (×5): qty 2

## 2022-11-03 MED ORDER — NEPRO/CARBSTEADY PO LIQD
237.0000 mL | Freq: Two times a day (BID) | ORAL | Status: DC
  Administered 2022-11-05 – 2022-11-09 (×2): 237 mL via ORAL

## 2022-11-03 NOTE — Progress Notes (Signed)
Mobility Specialist Progress Note:   11/03/22 1200  Mobility  Activity Ambulated with assistance in hallway  Level of Assistance Contact guard assist, steadying assist  Assistive Device Front wheel walker  Distance Ambulated (ft) 50 ft  Activity Response Tolerated fair  Mobility Referral Yes  $Mobility charge 1 Mobility  Mobility Specialist Start Time (ACUTE ONLY) 1215  Mobility Specialist Stop Time (ACUTE ONLY) 1235  Mobility Specialist Time Calculation (min) (ACUTE ONLY) 20 min    Pre Mobility: 44 HR , 128/46 BP  Post Mobility: 43 HR   Pt received in bed, agreeable to mobility. Pt c/o dizziness when transitioned to EOB and during ambulation requiring seated rest break. Pt unable to ambulate back to room d/t weakness. Pt left in chair with call bell near and all needs met.   Leory Plowman  Mobility Specialist Please contact via [employer] office at [phone]

## 2022-11-03 NOTE — Progress Notes (Signed)
Physical Therapy Treatment Patient Details Name: Kayla [name] MRN: [id number] DOB: [birth year] Today's Date: 11/03/2022   History of Present Illness Pt is 82 year old female brought to the ED 10/25/22 for SoB and AMS. Found to be in respiratory distress, HR 30s. ABG pH 7.1 with high pCO2 and low bicarbonate, Alo noted to have AKI. CRRT 6/23-6/24  PMH: obesity, type 2 diabetes, hypertension, hyperlipidemia, CAD, congestive heart failure, paroxysmal atrial fibrillation (on Eliquis), sick sinus syndrome status post pacemaker, CKD, anemia, COPD, GERD, hypothyroidism, chronic venous insufficiency, status post left first ray amputation (09/04/2022) with active osteomyelitis    PT Comments  Patient with difficulty progressing ambulation today due to dizziness.  Did not some orthostatic hypotension with SBP sitting 167 and standing 133.  Though reported more spinning than light headedness so completed vestibular screen to find pt potentially with R unilateral vestibular hypofunction.  She denies history of vertigo but does have abnormal head tilt with R eye higher in orbit and compensating with head tilt to R.  Patient significantly limited so will progress with treatment for vestibular issues.  She and spouse agree she may need post-acute inpatient rehab (<3 hours/day) as spouse states he would be unable to handle her at home in her current state.  PT will continue to follow.   Vestibular Assessment - 11/03/22 1709       Symptom Behavior   Type of Dizziness  Spinning   NAUSEA   Frequency of Dizziness intermittent    Duration of Dizziness for the time up on her feet    Symptom Nature Intermittent;Variable;Motion provoked    Aggravating Factors Activity in general;Sit to stand    Relieving Factors Rest    Progression of Symptoms No change since onset    History of similar episodes none      Oculomotor Exam   Oculomotor Alignment Abnormal   L eye higher in orbit   Ocular ROM WFL some end gaze  nystagmus    Spontaneous Absent    Gaze-induced  Absent    Smooth Pursuits Intact    Saccades Slow   induces dizziness     Oculomotor Exam-Fixation Suppressed    Left Head Impulse intact    Right Head Impulse positive for refixation      Vestibulo-Ocular Reflex   VOR Cancellation Corrective saccades   more to R than L     Auditory   Comments intact and equal to scratch test                Assistance Recommended at Discharge Frequent or constant Supervision/Assistance  If plan is discharge home, recommend the following:  Can travel by private vehicle    A little help with walking and/or transfers;A little help with bathing/dressing/bathroom;Assistance with cooking/housework;Direct supervision/assist for medications management;Direct supervision/assist for financial management;Assist for transportation;Help with stairs or ramp for entrance   No  Equipment Recommendations  Rolling walker (2 wheels);Wheelchair cushion (measurements PT);Wheelchair (measurements PT)    Recommendations for Other Services       Precautions / Restrictions Precautions Precautions: Fall Precaution Comments: Dizziness noted with position changes:     Mobility  Bed Mobility               General bed mobility comments: up in recliner    Transfers Overall transfer level: Needs assistance Equipment used: Rolling walker (2 wheels), None Transfers: Bed to chair/wheelchair/BSC Sit to Stand: Min assist Stand pivot transfers: Min assist, Mod assist  General transfer comment: initially up to [facility] without RW and mod A for balance with stand pivot with cues for hand placement; to stand to RW with min A    Ambulation/Gait Ambulation/Gait assistance: Min assist Gait Distance (Feet): 6 Feet Assistive device: Rolling walker (2 wheels) Gait Pattern/deviations: Step-to pattern, Decreased stride length, Shuffle, Trunk flexed       General Gait Details: slow and effortful, reports  cannot walk further due to dizziness   Stairs             Wheelchair Mobility     Tilt Bed    Modified Rankin (Stroke Patients Only)       Balance Overall balance assessment: Needs assistance   Sitting balance-Leahy Scale: Fair     Standing balance support: Reliant on assistive device for balance, During functional activity Standing balance-Leahy Scale: Poor Standing balance comment: UE support and assist during toilet hygiene after using BSC                            Cognition Arousal/Alertness: Awake/alert Behavior During Therapy: Anxious Overall Cognitive Status: Impaired/Different from baseline Area of Impairment: Following commands, Safety/judgement, Awareness, Problem solving, Memory                     Memory: Decreased short-term memory Following Commands: Follows one step commands consistently, Follows one step commands with increased time     Problem Solving: Slow processing, Requires verbal cues General Comments: agreeable today for rehab prior to home        Exercises      General Comments General comments (skin integrity, edema, etc.): spouse in room and supportive but relates he cannot handle her if she is unable to walk so pt willing to consider SNF; SBP seated 167, standing 133      Pertinent Vitals/Pain Pain Assessment Pain Assessment: Faces Faces Pain Scale: Hurts a little bit Pain Location: generalized Pain Descriptors / Indicators: Discomfort Pain Intervention(s): Repositioned, Monitored during session    Home Living                          Prior Function            PT Goals (current goals can now be found in the care plan section) Progress towards PT goals: Not progressing toward goals - comment    Frequency    Min 1X/week      PT Plan Discharge plan needs to be updated    Co-evaluation              AM-PAC PT "6 Clicks" Mobility   Outcome Measure  Help needed turning from  your back to your side while in a flat bed without using bedrails?: A Little Help needed moving from lying on your back to sitting on the side of a flat bed without using bedrails?: A Little Help needed moving to and from a bed to a chair (including a wheelchair)?: A Lot Help needed standing up from a chair using your arms (e.g., wheelchair or bedside chair)?: A Little Help needed to walk in hospital room?: Total Help needed climbing 3-5 steps with a railing? : Total 6 Click Score: 13    End of Session Equipment Utilized During Treatment: Gait belt;Oxygen Activity Tolerance: Treatment limited secondary to medical complications (Comment) (dizziness, positive orthostatic BP) Patient left: in chair;with call bell/phone within reach;with chair alarm set;with family/visitor present  PT Visit Diagnosis: Other abnormalities of gait and mobility (R26.89);Repeated falls (R29.6);History of falling (Z91.81);Muscle weakness (generalized) (M62.81);Dizziness and giddiness (R42)     Time: 1610-9604 PT Time Calculation (min) (ACUTE ONLY): 27 min  Charges:    $Therapeutic Activity: 8-22 mins $Neuromuscular Re-education: 8-22 mins PT General Charges $$ ACUTE PT VISIT: 1 Visit                     Sheran Lawless, PT Acute Rehabilitation Services [phone] 11/03/2022    Elray Mcgregor 11/03/2022, 5:09 PM

## 2022-11-03 NOTE — Progress Notes (Signed)
                                                     Palliative Care Progress Note, Assessment & Plan   Patient Name: Kayla [name]       Date: 11/03/2022 DOB: 07-12-1940  Age: 82 y.o. MRN#: [id number] Attending Physician: Fran Lowes, DO Primary Care Physician: Ailene Ravel, MD Admit Date: 10/25/2022  Chart reviewed. No acute palliative needs today.  Symptom burden remains low.   Goals are clear. See note from 11/02/22 for further details.   DNR with limited interventions remains.   Patient/family have PMT contact information and were encouraged to contact PMT with any future acute palliative needs.   PMT will monitor the patient peripherally and shadow her chart.   Please re-engage with PMT if goals change, at patient/family's request, or if patient's health deteriorates during hospitalization.    Samara Deist L. Manon Hilding, FNP-BC Palliative Medicine Team Team Phone # [phone]  No charge

## 2022-11-03 NOTE — Progress Notes (Signed)
Nutrition Follow-up  DOCUMENTATION CODES:   Not applicable  INTERVENTION:   - Continue renal MVI daily  - Pt is at high risk for developing malnutrition. Given poor PO intake and increased nutrient needs, liberalize diet to Regular to provide additional food options at meal times. Potassium has been WNL since 6/24, and pt has been started on phosphorus binders for elevated phosphorus.  - Trial Nepro Shake po BID, each supplement provides 425 kcal and 19 grams protein  NUTRITION DIAGNOSIS:   Increased nutrient needs related to acute illness, catabolic illness as evidenced by estimated needs.  Ongoing, being addressed via diet liberalization and oral nutrition supplements  GOAL:   Patient will meet greater than or equal to 90% of their needs  Progressing  MONITOR:   PO intake, Labs, Weight trends, Skin, I & O's  REASON FOR ASSESSMENT:   Consult Assessment of nutrition requirement/status  ASSESSMENT:   82 yo female admitted with acute on chronic respiratory failure with mixed acidosis, acute on chronic HFpEF, AKI on CKD 4 requiring CRRT, sepsis. PMH includes, CAD, CHF, CKD, COPD, DM, GERD, OSA, multiple toe amputations.  06/24 - CRRT d/c  Nephrology continues to follow pt's BUN and creatinine. Pt's creatinine has risen the last 2 days. Per note, pt is a poor long-term HD candidate given her advanced age, multiple co-morbidities, and poor functional and nutritional status.  Pt unavailable at time of RD visit. Noted meal completions have been variable, between 10-50%. Pt is at high risk for developing malnutrition. Given poor PO intake and increased nutrient needs, RD to liberalize diet to Regular to provide additional food options at meal times. Previous diet of Renal/Carb Modified is the most restrictive hospital diet. RD will also order oral nutrition supplements to aid in meeting kcal and protein needs.  Weight down ~7 kg this admission. I/O's not accurate charted. Suspect  some of weight loss may be attributed to volume status. However, pt with mild pitting generalized edema, mild pitting edema to BUE, and mild pitting edema to BLE per nursing documentation.  Noted Palliative Medicine Team following for GOC discussions.  Admit weight: 110.2 kg Current weight: 103.6 kg  Meal Completion: 10-50%  Medications reviewed and include: dulcolax, aranesp weekly, SSI, rena-vit, renvela TID with meals, torsemide  Labs reviewed: BUN 73, creatinine 3.89, phosphorus 6.4, WBC 13.5, hemoglobin 8.0 CBG's: 174-253 x 24 hours  Diet Order:   Diet Order             Diet regular Room service appropriate? Yes with Assist; Fluid consistency: Thin; Fluid restriction: 1200 mL Fluid  Diet effective now                   EDUCATION NEEDS:   Education needs have been addressed  Skin:  Skin Assessment: Skin Integrity Issues:: Other: non-pressure wounds to LLE, skin tear RUE, non-pressure wound to R hand, skin tear LUE, non-pressure wound to buttocks  Last BM:  11/03/22 large type 6  Height:   Ht Readings from Last 1 Encounters:  10/25/22 5\' 5"  (1.651 m)    Weight:   Wt Readings from Last 1 Encounters:  10/29/22 103.6 kg    BMI:  Body mass index is 38.01 kg/m.  Estimated Nutritional Needs:   Kcal:  1700-1900 kcals  Protein:  85-100 grams  Fluid:  1L plus UOP    Mertie Clause, MS, RD, LDN Inpatient Clinical Dietitian Please see AMiON for contact information.

## 2022-11-03 NOTE — Progress Notes (Signed)
PROGRESS NOTE  Kayla [name] WUJ:[id number] DOB: [birth year] DOA: 10/25/2022 PCP: Ailene Ravel, MD  Brief History   Patient is an 82 year old Caucasian female, significantly obese, with recent history of MRSA bacteremia/infective endocarditis for which patient was on antibiotics for a long time.  Other medical history includes coronary artery disease, carotid artery disease, chronic kidney disease stage IV, COPD, diabetes mellitus type 2, hypertension, hyperlipidemia, hypothyroidism and documented congestive heart failure.  Recent echocardiograms have revealed normal left ventricular ejection fraction, without mention of diastolic dysfunction.  Patient was admitted with pulmonary edema, acute kidney injury on chronic kidney disease stage IV with oliguria versus anuria, requiring CRRT for about 48 hours.  Improvement in renal function is noted.  Patient continues to make urine.  Hypercapnic respiratory failure was also noted on presentation.  Patient has been on home oxygen for but a year.   Patient was admitted to ICU, underwent CRRT, treated for respiratory failure and pulmonary edema.  TRH assumed care on 10/30/2022.    The patient has been continued on lasix 80 mg IV bid. Doppler of renal arteries was normal. Rib x-ray discovered that aminimally displaced posterolateral left seventh rib fracture is the likely cause of the patient's left upper quadrant/left lateral chest wall pain. Over the course of the last 3 days that patient's renal function has initially improved from 3.18 to 2.95 and then worsened (from 2.95 to 3.38 to  3.89. Nephrology is aware. It has not been possible to follow the patient's volume status carefully, as nursing has not been documenting output from purewick. (Patient states that it doesn't work, and urine goes everywhere). IV fluids had been stopped on 11/02/2022. It seems that the patient's intake of fluids is very low. She has been encouraged to drink. Nephrology has changed  the patient's diuretic from laisx 80 mg IV bid to torsemide 40 mg daily which was her home dose of diuretic. Monitor carefully.   Palliative care has been consulted. They have confirmed that the patient wishes to be a DNR, but wants everything otherwise to be done to keep her alive. This would include HD, although she is a poor candidate per nephrology.  I appreciate nephrology's help.  She will be discharged to home once cleared for discharge by nephrology.   Subjective  The patient is resting comfortably. She is somewhat confused. She states that she doesn't want to drink, because she does not want to wet the bed.  Objective   Vitals:  Vitals:   11/03/22 0802 11/03/22 1537  BP: (!) 133/44 138/89  Pulse:  (!) 43  Resp:  17  Temp:  (!) 97.2 F (36.2 C)  SpO2:  97%    Exam:  Constitutional:  The patient is awake, alert, but somewhat confused. No acute distress. Respiratory:  No increased work of breathing. No wheezes, rales, or rhonchi No tactile fremitus Cardiovascular:  Regular rate and rhythm No murmurs, ectopy, or gallups. No lateral PMI. No thrills. Abdomen:  Abdomen is soft, non-tender, non-distended No hernias, masses, or organomegaly Normoactive bowel sounds.  Musculoskeletal:  No cyanosis, clubbing, or edema Skin:  No rashes, lesions, ulcers palpation of skin: no induration or nodules Neurologic:  CN 2-12 intact Sensation all 4 extremities intact Psychiatric:  Mental status Mood, affect appropriate Orientation to person, place, time  I have personally reviewed the following:   Today's Data   Vitals:   11/03/22 0802 11/03/22 1537  BP: (!) 133/44 138/89  Pulse:  (!) 43  Resp:  17  Temp:  (!) 97.2 F (36.2 C)  SpO2:  97%     Lab Data      Latest Ref Rng & Units 11/03/2022   12:13 AM 11/02/2022   12:16 AM 11/01/2022   12:21 AM  CBC  WBC 4.0 - 10.5 K/uL 13.5  12.6  12.4   Hemoglobin 12.0 - 15.0 g/dL 8.0  7.7  7.4   Hematocrit 36.0 - 46.0 %  27.6  26.8  24.8   Platelets 150 - 400 K/uL 322  355  363       Latest Ref Rng & Units 11/03/2022   12:13 AM 11/02/2022   12:16 AM 11/01/2022   12:21 AM  BMP  Glucose 70 - 99 mg/dL 409  811  914   BUN 8 - 23 mg/dL 73  67  62   Creatinine 0.44 - 1.00 mg/dL 7.82  9.56  2.13   Sodium 135 - 145 mmol/L 136  136  136   Potassium 3.5 - 5.1 mmol/L 4.5  4.5  4.0   Chloride 98 - 111 mmol/L 99  99  99   CO2 22 - 32 mmol/L 25  26  27    Calcium 8.9 - 10.3 mg/dL 8.6  8.7  8.8       Micro Data   Results for orders placed or performed during the hospital encounter of 10/25/22  Culture, blood (routine x 2)     Status: None   Collection Time: 10/25/22  8:24 AM   Specimen: BLOOD LEFT ARM  Result Value Ref Range Status   Specimen Description BLOOD LEFT ARM  Final   Special Requests   Final    BOTTLES DRAWN AEROBIC AND ANAEROBIC Blood Culture adequate volume   Culture   Final    NO GROWTH 5 DAYS Performed at [facility], [address] [place], [place] [address]    Report Status 10/30/2022 FINAL  Final  Culture, blood (routine x 2)     Status: None   Collection Time: 10/25/22  8:26 AM   Specimen: BLOOD  Result Value Ref Range Status   Specimen Description BLOOD SITE NOT SPECIFIED  Final   Special Requests   Final    BOTTLES DRAWN AEROBIC AND ANAEROBIC Blood Culture results may not be optimal due to an inadequate volume of blood received in culture bottles   Culture   Final    NO GROWTH 5 DAYS Performed at [facility], [address] [place], [place] [address]    Report Status 10/30/2022 FINAL  Final  MRSA Next Gen by PCR, Nasal     Status: None   Collection Time: 10/25/22 11:46 AM   Specimen: Nasal Mucosa; Nasal Swab  Result Value Ref Range Status   MRSA by PCR Next Gen NOT DETECTED NOT DETECTED Final    Comment: (NOTE) The GeneXpert MRSA Assay (FDA approved for NASAL specimens only), is one component of a comprehensive MRSA colonization surveillance program. It is not  intended to diagnose MRSA infection nor to guide or monitor treatment for MRSA infections. Test performance is not FDA approved in patients less than 98 years old. Performed at [facility], [address] [place], [place] [address]     Scheduled Meds:  amLODipine  10 mg Oral Daily   apixaban  2.5 mg Oral BID   atorvastatin  40 mg Oral Daily   bisacodyl  10 mg Rectal Daily   Or   bisacodyl  10 mg  Oral Daily   darbepoetin (ARANESP) injection - NON-DIALYSIS  60 mcg Subcutaneous Q Wed-1800   [START ON 11/04/2022] feeding supplement (NEPRO CARB STEADY)  237 mL Oral BID BM   guaiFENesin  600 mg Oral BID   insulin aspart  0-6 Units Subcutaneous TID WC   isosorbide mononitrate  30 mg Oral Daily   levothyroxine  200 mcg Oral Q0600   lidocaine  1 patch Transdermal Q24H   multivitamin  1 tablet Oral QHS   mouth rinse  15 mL Mouth Rinse 4 times per day   sevelamer carbonate  800 mg Oral TID WC   sodium chloride flush  3 mL Intravenous Q12H   torsemide  40 mg Oral Daily   Continuous Infusions:  sodium chloride Stopped (10/26/22 1934)    Principal Problem:   Acute respiratory failure with hypoxia and hypercapnia (HCC) Active Problems:   AKI (acute kidney injury) (HCC)   Junctional bradycardia   Hyperkalemia   LOS: 9 days    A & P  Assessment & Plan:   Principal Problem:   Acute respiratory failure with hypoxia and hypercapnia (HCC) Active Problems:   AKI (acute kidney injury) (HCC)   Junctional bradycardia   Hyperkalemia   Acute kidney injury on chronic kidney disease stage IV/pulmonary edema: -AKI is resolving.   -Serum creatinine has increased to 3.89. It has been increasing for the last 2 days. -Nephrology has changed diuresis from lasix 80 mg bid to torsemide 40 mg daily. Monitor. -Patient was on CRRT for about 48 hours. She states that she would agree to HD, if necessary, although nephrology states that she is a poor candidate for it.  -The patient has had poor  PO intake of fluids. She has been encouraged to drink, but she states that she doesn't like to drink, because she fears wetting the bed.  -Strict I's and O's. -Continue to monitor input and input. -Avoid nephrotoxins. -Dose all medications assuming GFR of less than 10 mL/min. -Keep MAP greater than 65 mmHg.    Acute on chronic hypoxic/hypercapnic respiratory failure: -Patient has been on oxygen for about 1 year (2 L/min via nasal cannula). -Etiology is likely multifactorial. -Respiratory failure is slowly improving. -Discussed need to comply with CPAP management and, treatment of OSA/likely undiagnosed OHS. 11/01/2022: Stable.   COPD: -Seems compensated. -Continue current regimen.   Left lateral chest wall pain: -X-ray of the ribs revealed minimally displaced posterolateral left seventh rib fracture.  -Adequate pain control. -Lidocaine patch to left lateral rib cage area. -Supportive management.   Paroxysmal atrial fibrillation: -Controlled heart rate. -Patient is on apixaban 2.5 Mg p.o. twice daily.   Anemia of chronic kidney disease: -Patient has received IV iron. -Patient is on Aranesp every 2 weeks.   Hypertension: -Continue to optimize. -Goal blood pressure should be less than 130/80 mmHg.   Diabetes mellitus:  -Continue sliding scale insulin coverage. -Continue to monitor blood sugar and adjust accordingly.   Hyperlipidemia: -Continue atorvastatin.   Possible community acquired pneumonia: -Findings/events may be suggestive of pulmonary edema. -IV Zosyn has been discontinued.     DVT prophylaxis: Apixaban. Code Status: DO NOT RESUSCITATE. Family Communication: Husband. Disposition Plan: This will depend on hospital course.     Consultants:  Nephrology.   Procedures:  Patient was on CRRT.   Antimicrobials:  IV Zosyn has been discontinued.    Dachelle Molzahn, DO [facility] Direct contact: see [email or web address]  7PM-7AM contact night coverage as  above 11/03/2022, 3:56 PM  LOS: 9 days

## 2022-11-03 NOTE — Progress Notes (Signed)
Patient ID: Kayla [name], female   DOB: 05/17/1940, 82 y.o.   MRN: [id number] S: Feels well, wants to go home. O:BP (!) 133/44   Pulse (!) 46   Temp (!) 97.5 F (36.4 C) (Axillary)   Resp 13   Ht 5\' 5"  (1.651 m)   Wt 103.6 kg   SpO2 96%   BMI 38.01 kg/m  No intake or output data in the 24 hours ending 11/03/22 0915 Intake/Output: I/O last 3 completed shifts: In: 100 [P.O.:100] Out: 175 [Urine:175]  Intake/Output this shift:  No intake/output data recorded. Weight change:  Gen: NAD CVS: bradycardic at 46 Resp:CTA Abd: +BS, soft,NT/ND Ext: no edema  Recent Labs  Lab 10/28/22 0500 10/29/22 1325 10/30/22 0017 10/31/22 0016 11/01/22 0021 11/02/22 0016 11/03/22 0013  NA 137 137 137 137 136 136 136  K 4.1 4.2 4.3 4.2 4.0 4.5 4.5  CL 99 98 96* 99 99 99 99  CO2 26 25 26 25 27 26 25   GLUCOSE 100* 154* 176* 163* 162* 230* 198*  BUN 60* 73* 67* 67* 62* 67* 73*  CREATININE 3.04* 3.26* 3.34* 3.18* 2.95* 3.37* 3.89*  ALBUMIN 2.1* 2.4* 2.0* 2.4* 2.3* 2.2* 2.3*  CALCIUM 8.3* 8.4* 8.5* 8.7* 8.8* 8.7* 8.6*  PHOS 5.2* 6.2* 6.3* 5.7* 5.4* 6.6* 6.4*   Liver Function Tests: Recent Labs  Lab 11/01/22 0021 11/02/22 0016 11/03/22 0013  ALBUMIN 2.3* 2.2* 2.3*   No results for input(s): "LIPASE", "AMYLASE" in the last 168 hours. No results for input(s): "AMMONIA" in the last 168 hours. CBC: Recent Labs  Lab 10/30/22 0017 10/31/22 0016 11/01/22 0021 11/02/22 0016 11/03/22 0013  WBC 12.4* 11.9* 12.4* 12.6* 13.5*  HGB 7.0* 7.5* 7.4* 7.7* 8.0*  HCT 23.3* 25.0* 24.8* 26.8* 27.6*  MCV 97.1 95.1 96.9 100.0 99.6  PLT 335 365 363 355 322   Cardiac Enzymes: No results for input(s): "CKTOTAL", "CKMB", "CKMBINDEX", "TROPONINI" in the last 168 hours. CBG: Recent Labs  Lab 11/02/22 0618 11/02/22 1057 11/02/22 1548 11/02/22 2104 11/03/22 0619  GLUCAP 170* 253* 174* 184* 185*    Iron Studies: No results for input(s): "IRON", "TIBC", "TRANSFERRIN", "FERRITIN" in the last 72  hours. Studies/Results: DG Ribs Unilateral Left  Result Date: 11/01/2022 CLINICAL DATA:  Left rib fracture. EXAM: LEFT RIBS - 2 VIEW COMPARISON:  Chest x-ray 10/31/2022 FINDINGS: Minimally displaced posterolateral left seventh rib fracture. Remainder of the exam is unchanged. IMPRESSION: Minimally displaced posterolateral left seventh rib fracture. Electronically Signed   By: Elberta Fortis M.D.   On: 11/01/2022 15:23   VAS US RENAL ARTERY DUPLEX  Result Date: 11/01/2022 ABDOMINAL VISCERAL Patient Name:  DIANCA LECHMAN  Date of Exam:   11/01/2022 Medical Rec #: [id number]         Accession #:    [id number] Date of Birth: 10/30/1940        Patient Gender: F Patient Age:   72 years Exam Location:  [facility] Procedure:      VAS US RENAL ARTERY DUPLEX Referring Phys: [id number] SYLVESTER I OGBATA -------------------------------------------------------------------------------- Indications: Stenosis, renal I70.1 High Risk Factors: Hypertension, hyperlipidemia, Diabetes. Limitations: Air/bowel gas, obesity, patient discomfort and patient positioning, patient constant movement, respiratory disturbance. Comparison Study: No prior studies. Performing Technologist: Chanda Busing RVT  Examination Guidelines: A complete evaluation includes B-mode imaging, spectral Doppler, color Doppler, and power Doppler as needed of all accessible portions of each vessel. Bilateral testing is considered an integral part of a complete examination. Limited examinations for reoccurring indications  may be performed as noted.  Duplex Findings: +--------------------+--------+--------+------+------------------+ Mesenteric          PSV cm/sEDV cm/sPlaque     Comments      +--------------------+--------+--------+------+------------------+ Aorta Mid             221      22                            +--------------------+--------+--------+------+------------------+ Celiac Artery Origin                      Unable to  insonate +--------------------+--------+--------+------+------------------+ SMA Origin                                Unable to insonate +--------------------+--------+--------+------+------------------+    +------------------+--------+--------+-------+ Right Renal ArteryPSV cm/sEDV cm/sComment +------------------+--------+--------+-------+ Origin               19      8            +------------------+--------+--------+-------+ Proximal             24      8            +------------------+--------+--------+-------+ Mid                  42      9            +------------------+--------+--------+-------+ Distal               37      10           +------------------+--------+--------+-------+ +-----------------+--------+--------+-------+ Left Renal ArteryPSV cm/sEDV cm/sComment +-----------------+--------+--------+-------+ Origin              59      9            +-----------------+--------+--------+-------+ Proximal            70      5            +-----------------+--------+--------+-------+ Mid                 67      10           +-----------------+--------+--------+-------+ Distal              41      6            +-----------------+--------+--------+-------+ +------------+--------+--------+----+-----------+--------+--------+----+ Right KidneyPSV cm/sEDV cm/sRI  Left KidneyPSV cm/sEDV cm/sRI   +------------+--------+--------+----+-----------+--------+--------+----+ Upper Pole  29      5       0.82Upper Pole 23      4       0.82 +------------+--------+--------+----+-----------+--------+--------+----+ Mid         27      4       0.        21      5       0.77 +------------+--------+--------+----+-----------+--------+--------+----+ Lower Pole  22      4       0.80Lower Pole 25      6       0.76 +------------+--------+--------+----+-----------+--------+--------+----+ Hilar       43      8       0.81Hilar      28       6       0.78 +------------+--------+--------+----+-----------+--------+--------+----+ +------------------+----+------------------+-----+  Right Kidney          Left Kidney             +------------------+----+------------------+-----+ RAR                   RAR                     +------------------+----+------------------+-----+ RAR (manual)      0.19RAR (manual)      0.32  +------------------+----+------------------+-----+ Cortex                Cortex                  +------------------+----+------------------+-----+ Cortex thickness      Corex thickness         +------------------+----+------------------+-----+ Kidney length (cm)9.30Kidney length (cm)10.00 +------------------+----+------------------+-----+  Summary: Renal:  Right: Normal size right kidney. Normal right Resisitive Index. No        evidence of right renal artery stenosis. Left:  No evidence of left renal artery stenosis. Normal size of        left kidney. Normal left Resistive Index.  *See table(s) above for measurements and observations.  Diagnosing physician: Coral Else MD  Electronically signed by Coral Else MD on 11/01/2022 at 11:17:34 AM.    Final     amLODipine  10 mg Oral Daily   apixaban  2.5 mg Oral BID   atorvastatin  40 mg Oral Daily   bisacodyl  10 mg Rectal Daily   Or   bisacodyl  10 mg Oral Daily   darbepoetin (ARANESP) injection - NON-DIALYSIS  60 mcg Subcutaneous Q Wed-1800   guaiFENesin  600 mg Oral BID   insulin aspart  0-6 Units Subcutaneous TID WC   isosorbide mononitrate  30 mg Oral Daily   levothyroxine  200 mcg Oral Q0600   lidocaine  1 patch Transdermal Q24H   multivitamin  1 tablet Oral QHS   mouth rinse  15 mL Mouth Rinse 4 times per day   sodium chloride flush  3 mL Intravenous Q12H    BMET    Component Value Date/Time   NA 136 11/03/2022 0013   NA 140 02/16/2022 1008   K 4.5 11/03/2022 0013   CL 99 11/03/2022 0013   CO2 25 11/03/2022 0013   GLUCOSE 198 (H)  11/03/2022 0013   BUN 73 (H) 11/03/2022 0013   BUN 47 (H) 02/16/2022 1008   CREATININE 3.89 (H) 11/03/2022 0013   CREATININE 2.10 (H) 10/07/2022 0939   CALCIUM 8.6 (L) 11/03/2022 0013   CALCIUM 9.9 06/04/2008 2240   GFRNONAA 11 (L) 11/03/2022 0013   GFRAA 45 (L) 02/16/2020 1124   CBC    Component Value Date/Time   WBC 13.5 (H) 11/03/2022 0013   RBC 2.77 (L) 11/03/2022 0013   HGB 8.0 (L) 11/03/2022 0013   HGB 11.3 01/30/2021 1154   HCT 27.6 (L) 11/03/2022 0013   HCT 35.9 01/30/2021 1154   PLT 322 11/03/2022 0013   PLT 389 01/30/2021 1154   MCV 99.6 11/03/2022 0013   MCV 91 01/30/2021 1154   MCH 28.9 11/03/2022 0013   MCHC 29.0 (L) 11/03/2022 0013   RDW 18.1 (H) 11/03/2022 0013   RDW 13.4 01/30/2021 1154   LYMPHSABS 1.4 10/25/2022 0730   MONOABS 1.3 (H) 10/25/2022 0730   EOSABS 0.1 10/25/2022 0730   BASOSABS 0.0 10/25/2022 0730    Assessment/Plan: Acute kidney injury on CKD stage IV likely due to CHF exacerbation/fluid  overload/bradycardia leading to reduced renal perfusion.  B/L Cr around 2, followed by me as an outpatient. She had hyperkalemia, anasarca and did not respond with IV diuretics.  Also had features of uremic encephalopathy including tremor, AMS. -temp HD catheter placed on 6/23, started CRRT 6/23. CRRT off on 6/24 given access issues/alarms. RIJ temp line removed 6/26. Uremic symptoms improved at that time -Cr down to 2.95 but rose to 3.37 yesterday and up to 3.83 this morning. She received her am dose of lasix but the second dose was held.  Will resume her outpatient torsemide dose of 40 mg daily and follow UOP and BUN/Cr.   Unfortunately no output recorded over the past 24 hours.  Discussed with RN at bedside.   -no indications for HD/CRRT at this time-volume status better and not exhibiting any uremic signs/symptoms. Side note: discussed with patient and husband and she would want to pursue dialysis in the long term. She is interested in home therapies down the  road if/when indicated.  She is a poor longterm HD candidate given her advanced age, multiple co-morbidities, and poor functional and nutritional status.  Hopefully we can avoid resuming HD for now.  -normal renal artery duplex today, no evidence of RAS -daily labs, strict I/O -hold off on discharge for now until Scr stabilizes.    Hyperkalemia: Received medical treatment in the ER.  Improved with CRRT (currently off). On lasix + renal diet. K WNL now   Bradycardia: Seen by cardiology during admit. H/o PPM extraction secondary to bacteremia (Sep 07, 2022).  Off dopamine gtt, HR stable. Avoid beta blockers   Acute on chronic respiratory failure/respiratory distress with hypoxia: lasix as above. CXR 6/25 revealed improvement in opacities. On 3L [employer] at baseline @ home -improved.   Sepsis due to possible pneumonia: On antibiotics per primary team.   Hyponatremia, mild: likely secondary to AKI and hypervolemia. WNL now   Anemia: transfuse for Hgb <7. ESA start 6/26. 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  Will start binders, c/w renal diet  Irena Cords, MD [facility]

## 2022-11-03 NOTE — Progress Notes (Signed)
Occupational Therapy Treatment Patient Details Name: Kayla [name] MRN: [id number] DOB: Sep 04, 1940 Today's Date: 11/03/2022   History of present illness Pt is 82 year old female brought to the ED 10/25/22 for SoB and AMS. Found to be in respiratory distress, HR 30s. ABG pH 7.1 with high pCO2 and low bicarbonate, Alo noted to have AKI. CRRT 6/23-6/24  PMH: obesity, type 2 diabetes, hypertension, hyperlipidemia, CAD, congestive heart failure, paroxysmal atrial fibrillation (on Eliquis), sick sinus syndrome status post pacemaker, CKD, anemia, COPD, GERD, hypothyroidism, chronic venous insufficiency, status post left first ray amputation (09/04/2022) with active osteomyelitis   OT comments  Patient with fair progress toward patient focused goals.  Patient still complaining of rib pain, and dizziness with position changes.  Patient is still needing CGA for in room mobility over short distances, and closer to Mod A for lower body ADL from a sit to stand level.  OT will continue to follow to address deficits, and HH OT is recommended, but may need to consider SNF stay if she is unable to progress mobility enough to return home.     Recommendations for follow up therapy are one component of a multi-disciplinary discharge planning process, led by the attending physician.  Recommendations may be updated based on patient status, additional functional criteria and insurance authorization.    Assistance Recommended at Discharge Frequent or constant Supervision/Assistance  Patient can return home with the following  A little help with walking and/or transfers;Direct supervision/assist for medications management;Direct supervision/assist for financial management;Assist for transportation;Assistance with cooking/housework;A lot of help with bathing/dressing/bathroom   Equipment Recommendations  None recommended by OT    Recommendations for Other Services      Precautions / Restrictions  Precautions Precautions: Fall Precaution Comments: Dizziness noted with position changes:  sit SBP 140, Stand SBP 98. Restrictions Weight Bearing Restrictions: No       Mobility Bed Mobility Overal bed mobility: Needs Assistance Bed Mobility: Sit to Sidelying         Sit to sidelying: Min guard      Transfers Overall transfer level: Needs assistance Equipment used: Rolling walker (2 wheels) Transfers: Sit to/from Stand, Bed to chair/wheelchair/BSC Sit to Stand: Min guard     Step pivot transfers: Min guard           Balance Overall balance assessment: Needs assistance Sitting-balance support: Feet supported, No upper extremity supported Sitting balance-Leahy Scale: Fair     Standing balance support: Reliant on assistive device for balance Standing balance-Leahy Scale: Poor                             ADL either performed or assessed with clinical judgement   ADL                   Upper Body Dressing : Set up;Sitting   Lower Body Dressing: Moderate assistance;Sit to/from stand   Toilet Transfer: Min guard;Rolling walker (2 wheels);BSC/3in1;Stand-pivot                  Extremity/Trunk Assessment Upper Extremity Assessment Upper Extremity Assessment: Overall WFL for tasks assessed   Lower Extremity Assessment Lower Extremity Assessment: Defer to PT evaluation   Cervical / Trunk Assessment Cervical / Trunk Assessment: Kyphotic    Vision Patient Visual Report: No change from baseline     Perception Perception Perception: Within Functional Limits   Praxis Praxis Praxis: Intact    Cognition Arousal/Alertness: Awake/alert Behavior During  Therapy: Anxious Overall Cognitive Status: Impaired/Different from baseline                         Following Commands: Follows one step commands with increased time     Problem Solving: Slow processing, Requires verbal cues, Requires tactile cues          Exercises       Shoulder Instructions       General Comments      Pertinent Vitals/ Pain       Pain Assessment Pain Assessment: Faces Faces Pain Scale: Hurts little more Pain Location: ribs Pain Descriptors / Indicators: Discomfort, Guarding, Tender Pain Intervention(s): Monitored during session, Patient requesting pain meds-RN notified                                                          Frequency  Min 1X/week        Progress Toward Goals  OT Goals(current goals can now be found in the care plan section)  Progress towards OT goals: Progressing toward goals  Acute Rehab OT Goals OT Goal Formulation: With patient Time For Goal Achievement: 11/12/22 Potential to Achieve Goals: Fair  Plan Discharge plan remains appropriate    Co-evaluation                 AM-PAC OT "6 Clicks" Daily Activity     Outcome Measure   Help from another person eating meals?: None Help from another person taking care of personal grooming?: A Little Help from another person toileting, which includes using toliet, bedpan, or urinal?: A Little Help from another person bathing (including washing, rinsing, drying)?: A Lot Help from another person to put on and taking off regular upper body clothing?: A Little Help from another person to put on and taking off regular lower body clothing?: A Lot 6 Click Score: 17    End of Session Equipment Utilized During Treatment: Rolling walker (2 wheels);Oxygen  OT Visit Diagnosis: Unsteadiness on feet (R26.81);Muscle weakness (generalized) (M62.81);Other symptoms and signs involving cognitive function   Activity Tolerance Other (comment) (Orthostatic)   Patient Left in bed;with call bell/phone within reach;with bed alarm set;with family/visitor present   Nurse Communication Patient requests pain meds        Time: 1610-9604 OT Time Calculation (min): 22 min  Charges: OT General Charges $OT Visit: 1 Visit OT  Treatments $Self Care/Home Management : 8-22 mins  11/03/2022  RP, OTR/L  Acute Rehabilitation Services  Office:  [phone]   Suzanna Obey 11/03/2022, 10:04 AM

## 2022-11-04 ENCOUNTER — Inpatient Hospital Stay (HOSPITAL_COMMUNITY): Payer: Medicare HMO

## 2022-11-04 HISTORY — PX: IR US GUIDE VASC ACCESS RIGHT: IMG2390

## 2022-11-04 HISTORY — PX: IR FLUORO GUIDE CV LINE RIGHT: IMG2283

## 2022-11-04 LAB — CBC
HCT: 25 % — ABNORMAL LOW (ref 36.0–46.0)
Hemoglobin: 7.3 g/dL — ABNORMAL LOW (ref 12.0–15.0)
MCH: 29.4 pg (ref 26.0–34.0)
MCHC: 29.2 g/dL — ABNORMAL LOW (ref 30.0–36.0)
MCV: 100.8 fL — ABNORMAL HIGH (ref 80.0–100.0)
Platelets: 374 10*3/uL (ref 150–400)
RBC: 2.48 MIL/uL — ABNORMAL LOW (ref 3.87–5.11)
RDW: 18.6 % — ABNORMAL HIGH (ref 11.5–15.5)
WBC: 11.7 10*3/uL — ABNORMAL HIGH (ref 4.0–10.5)
nRBC: 0.4 % — ABNORMAL HIGH (ref 0.0–0.2)

## 2022-11-04 LAB — RENAL FUNCTION PANEL
Albumin: 2.3 g/dL — ABNORMAL LOW (ref 3.5–5.0)
Anion gap: 12 (ref 5–15)
BUN: 74 mg/dL — ABNORMAL HIGH (ref 8–23)
CO2: 25 mmol/L (ref 22–32)
Calcium: 8.4 mg/dL — ABNORMAL LOW (ref 8.9–10.3)
Chloride: 96 mmol/L — ABNORMAL LOW (ref 98–111)
Creatinine, Ser: 4.28 mg/dL — ABNORMAL HIGH (ref 0.44–1.00)
GFR, Estimated: 10 mL/min — ABNORMAL LOW (ref >60)
Glucose, Bld: 195 mg/dL — ABNORMAL HIGH (ref 70–99)
Phosphorus: 6.2 mg/dL — ABNORMAL HIGH (ref 2.5–4.6)
Potassium: 4.4 mmol/L (ref 3.5–5.1)
Sodium: 133 mmol/L — ABNORMAL LOW (ref 135–145)

## 2022-11-04 LAB — MAGNESIUM: Magnesium: 2.3 mg/dL (ref 1.7–2.4)

## 2022-11-04 LAB — GLUCOSE, CAPILLARY
Glucose-Capillary: 160 mg/dL — ABNORMAL HIGH (ref 70–99)
Glucose-Capillary: 193 mg/dL — ABNORMAL HIGH (ref 70–99)
Glucose-Capillary: 211 mg/dL — ABNORMAL HIGH (ref 70–99)
Glucose-Capillary: 232 mg/dL — ABNORMAL HIGH (ref 70–99)

## 2022-11-04 LAB — HEPATITIS B SURFACE ANTIGEN: Hepatitis B Surface Ag: NONREACTIVE

## 2022-11-04 MED ORDER — LIDOCAINE HCL 1 % IJ SOLN
INTRAMUSCULAR | Status: AC
  Filled 2022-11-04: qty 20

## 2022-11-04 MED ORDER — CHLORHEXIDINE GLUCONATE CLOTH 2 % EX PADS: 6.0000 | MEDICATED_PAD | Freq: Every day | CUTANEOUS | Status: DC

## 2022-11-04 MED ORDER — CEFAZOLIN SODIUM-DEXTROSE 2-4 GM/100ML-% IV SOLN
INTRAVENOUS | Status: AC | PRN
  Administered 2022-11-04: 2 g via INTRAVENOUS

## 2022-11-04 MED ORDER — MIDAZOLAM HCL 2 MG/2ML IJ SOLN
INTRAMUSCULAR | Status: AC | PRN
  Administered 2022-11-04: 1 mg via INTRAVENOUS

## 2022-11-04 MED ORDER — HEPARIN SODIUM (PORCINE) 1000 UNIT/ML IJ SOLN
10.0000 mL | Freq: Once | INTRAMUSCULAR | Status: AC
  Administered 2022-11-04: 3.2 mL via INTRAVENOUS

## 2022-11-04 MED ORDER — DARBEPOETIN ALFA 100 MCG/0.5ML IJ SOSY
100.0000 ug | PREFILLED_SYRINGE | INTRAMUSCULAR | Status: DC
  Administered 2022-11-04 – 2022-11-11 (×2): 100 ug via SUBCUTANEOUS
  Filled 2022-11-04 (×2): qty 0.5

## 2022-11-04 MED ORDER — CEFAZOLIN SODIUM-DEXTROSE 2-4 GM/100ML-% IV SOLN
INTRAVENOUS | Status: AC
  Filled 2022-11-04: qty 100

## 2022-11-04 MED ORDER — LIDOCAINE HCL (PF) 1 % IJ SOLN
20.0000 mL | Freq: Once | INTRAMUSCULAR | Status: AC
  Administered 2022-11-04: 10 mL via INTRADERMAL

## 2022-11-04 MED ORDER — CHLORHEXIDINE GLUCONATE CLOTH 2 % EX PADS
6.0000 | MEDICATED_PAD | Freq: Every day | CUTANEOUS | Status: DC
  Administered 2022-11-04 – 2022-11-14 (×11): 6 via TOPICAL

## 2022-11-04 MED ORDER — HEPARIN SODIUM (PORCINE) 1000 UNIT/ML IJ SOLN
INTRAMUSCULAR | Status: AC
  Filled 2022-11-04: qty 10

## 2022-11-04 MED ORDER — MIDAZOLAM HCL 2 MG/2ML IJ SOLN
INTRAMUSCULAR | Status: AC
  Filled 2022-11-04: qty 2

## 2022-11-04 NOTE — [place] FL2 (Signed)
[place] MEDICA[place] FL2 LEVEL OF CARE FORM     IDENTIFICATION  Patient Name: Kayla [name] Birthdate: 30-Jul-1940 Sex: female Admission Date (Current Location): 10/25/2022  [facility] and [place] Number:  Producer, television/film/video and Address:  [facility], [address], [place], [place] [address]      Provider Number: [phone]  Attending Physician Name and Address:  Fran Lowes, DO  Relative Name and Phone Number:       Current Level of Care: Hospital Recommended Level of Care: Skilled Nursing Facility Prior Approval Number:    Date Approved/Denied:   PASRR Number: [id number]  Discharge Plan: SNF    Current Diagnoses: Patient Active Problem List   Diagnosis Date Noted   Acute respiratory failure with hypoxia and hypercapnia (HCC) 10/25/2022   Junctional bradycardia 10/25/2022   Hyperkalemia 10/25/2022   Bradycardia 10/01/2022   MRSA bacteremia 10/01/2022   AKI (acute kidney injury) (HCC) 10/01/2022   Complication of central venous catheter 10/01/2022   Osteomyelitis (HCC) 10/01/2022   Closed nondisplaced fracture of proximal phalanx of right great toe 09/06/2022   Pyogenic inflammation of bone (HCC) 09/05/2022   Acute hematogenous osteomyelitis of left foot (HCC) 09/04/2022   Diabetic foot infection (HCC) 09/03/2022   Chronic respiratory failure with hypoxia (HCC) 02/20/2022   Pleuritic pain 02/20/2022   Toe osteomyelitis, left (HCC) 01/20/2022   Carotid bruit 07/07/2021   Septic prepatellar bursitis of right knee 05/30/2021   Cellulitis of right lower extremity 03/19/2021   Chronic kidney disease, stage 3b (HCC) 03/19/2021   Polymyalgia rheumatica (HCC) 04/15/2020   Temporal arteritis (HCC) 04/15/2020   Pacemaker - MDT 01/24/2020   Orthostatic lightheadedness 01/24/2020   Chronic osteomyelitis involving left ankle and foot (HCC)    Paroxysmal atrial fibrillation (HCC) 10/25/2019   Secondary hypercoagulable state (HCC) 10/25/2019    Numbness and tingling of right leg 10/19/2019   Lumbar radiculopathy 10/19/2019   Chronic diastolic heart failure (HCC) 08/13/2019   Chest pain 08/13/2019   Leukocytosis 08/13/2019   Leg swelling 12/09/2018   Urinary frequency 12/09/2018   Pain in joint of right hip 01/21/2018   Myofascial pain syndrome, cervical 01/06/2018   Occipital neuralgia of right side 01/06/2018   RUQ abdominal pain    Gastritis and gastroduodenitis    Bile duct abnormality    Cholecystitis 04/30/2016   Gall bladder disease 04/28/2016   Sinus node dysfunction (HCC) 11/08/2015   Ejection fraction    Spinal stenosis of cervical region 11/07/2014   Preoperative clearance 10/09/2014   Carotid artery disease (HCC)    S/P left TKA 07/27/2011   Dizzy spells 05/04/2011   Palpitations 01/21/2011   RHINITIS 07/21/2010   Hyperlipidemia associated with type 2 diabetes mellitus (HCC) 11/24/2008   OBESITY 11/24/2008   Coronary atherosclerosis 11/24/2008   VENTRICULAR HYPERTROPHY, LEFT 11/24/2008   HYPOTHYROIDISM, POSTSURGICAL 06/04/2008   Headache 06/04/2008   Insulin dependent type 2 diabetes mellitus (HCC) 03/23/2007   Esophageal reflux 03/23/2007   DIVERTICULOSIS OF COLON 03/23/2007   GENERALIZED OSTEOARTHROSIS UNSPECIFIED SITE 03/23/2007   OSA (obstructive sleep apnea) 02/16/2007   Hypertension associated with diabetes (HCC) 02/16/2007   COPD (chronic obstructive pulmonary disease) (HCC) 02/16/2007    Orientation RESPIRATION BLADDER Height & Weight     Self, Time, Situation, Place  O2 (6 liters) Continent, External catheter Weight: 228 lb 6.3 oz (103.6 kg) Height:  5\' 5"  (165.1 cm)  BEHAVIORAL SYMPTOMS/MOOD NEUROLOGICAL BOWEL NUTRITION STATUS      Continent Diet (See dc  summary)  AMBULATORY STATUS COMMUNICATION OF NEEDS Skin   Limited Assist Verbally Normal                       Personal Care Assistance Level of Assistance  Bathing, Feeding, Dressing Bathing Assistance: Limited  assistance Feeding assistance: Independent Dressing Assistance: Limited assistance     Functional Limitations Info  Sight, Hearing, Speech Sight Info: Adequate Hearing Info: Impaired Speech Info: Adequate    SPECIAL CARE FACTORS FREQUENCY  PT (By licensed PT), OT (By licensed OT)     PT Frequency: 5xweek OT Frequency: 5xweek            Contractures Contractures Info: Not present    Additional Factors Info  Code Status, Allergies Code Status Info: DNR Allergies Info: Dulaglutide  Oxycodone  Trelegy Ellipta (Fluticasone-umeclidin-vilant)           Current Medications (11/04/2022):  This is the current hospital active medication list Current Facility-Administered Medications  Medication Dose Route Frequency Provider Last Rate Last Admin   0.9 %  sodium chloride infusion   Intravenous PRN Cheri Fowler, MD   Stopped at 10/26/22 1934   acetaminophen (TYLENOL) tablet 650 mg  650 mg Oral Q6H PRN Clydie Braun, MD   650 mg at 10/28/22 4782   Or   acetaminophen (TYLENOL) suppository 650 mg  650 mg Rectal Q6H PRN Madelyn Flavors A, MD       albuterol (PROVENTIL) (2.5 MG/3ML) 0.083% nebulizer solution 2.5 mg  2.5 mg Nebulization Q2H PRN Madelyn Flavors A, MD   2.5 mg at 11/02/22 1418   amLODipine (NORVASC) tablet 10 mg  10 mg Oral Daily Cheri Fowler, MD   10 mg at 11/03/22 1018   apixaban (ELIQUIS) tablet 2.5 mg  2.5 mg Oral BID Cheri Fowler, MD   2.5 mg at 11/03/22 2140   atorvastatin (LIPITOR) tablet 40 mg  40 mg Oral Daily Cheri Fowler, MD   40 mg at 11/03/22 1018   bisacodyl (DULCOLAX) suppository 10 mg  10 mg Rectal Daily Cheri Fowler, MD       Or   bisacodyl (DULCOLAX) EC tablet 10 mg  10 mg Oral Daily Cheri Fowler, MD   10 mg at 11/03/22 1017   Darbepoetin Alfa (ARANESP) injection 100 mcg  100 mcg Subcutaneous Q Wed-1800 Coladonato, Jomarie Longs, MD       feeding supplement (NEPRO CARB STEADY) liquid 237 mL  237 mL Oral BID BM Swayze, Ava, DO       fentaNYL (SUBLIMAZE)  injection 12.5 mcg  12.5 mcg Intravenous Q2H PRN Georgiann Cocker, FNP   12.5 mcg at 11/04/22 1108   guaiFENesin (MUCINEX) 12 hr tablet 600 mg  600 mg Oral BID Cheri Fowler, MD   600 mg at 11/03/22 2140   guaiFENesin-dextromethorphan (ROBITUSSIN DM) 100-10 MG/5ML syrup 5 mL  5 mL Oral Q4H PRN Cheri Fowler, MD   5 mL at 10/31/22 2339   hydrALAZINE (APRESOLINE) injection 10 mg  10 mg Intravenous Q4H PRN Cheri Fowler, MD       HYDROcodone-acetaminophen (NORCO/VICODIN) 5-325 MG per tablet 1-2 tablet  1-2 tablet Oral Q4H PRN Georgiann Cocker, FNP   2 tablet at 11/03/22 2140   insulin aspart (novoLOG) injection 0-6 Units  0-6 Units Subcutaneous TID WC Henry Russel, MD   1 Units at 11/04/22 0704   isosorbide mononitrate (IMDUR) 24 hr tablet 30 mg  30 mg Oral Daily Cheri Fowler, MD   30 mg at 11/03/22 1017  levothyroxine (SYNTHROID) tablet 200 mcg  200 mcg Oral Q0600 Cheri Fowler, MD   200 mcg at 11/04/22 0634   lidocaine (LIDODERM) 5 % 1 patch  1 patch Transdermal Q24H Berton Mount I, MD   1 patch at 11/03/22 1755   melatonin tablet 3 mg  3 mg Oral QHS PRN Henry Russel, MD   3 mg at 11/03/22 2140   multivitamin (RENA-VIT) tablet 1 tablet  1 tablet Oral QHS Berton Mount I, MD   1 tablet at 11/03/22 2140   Oral care mouth rinse  15 mL Mouth Rinse 4 times per day Cheri Fowler, MD   15 mL at 11/04/22 0805   Oral care mouth rinse  15 mL Mouth Rinse PRN Cheri Fowler, MD       Oral care mouth rinse  15 mL Mouth Rinse PRN Berton Mount I, MD       sevelamer carbonate (RENVELA) tablet 800 mg  800 mg Oral TID WC Terrial Rhodes, MD   800 mg at 11/04/22 1610   sodium chloride (OCEAN) 0.65 % nasal spray 1 spray  1 spray Each Nare PRN Cheri Fowler, MD       sodium chloride flush (NS) 0.9 % injection 3 mL  3 mL Intravenous Q12H Kras, Rondell A, MD   3 mL at 11/03/22 2143   torsemide (DEMADEX) tablet 40 mg  40 mg Oral Daily Terrial Rhodes, MD   40 mg at 11/03/22 1206     Discharge  Medications: Please see discharge summary for a list of discharge medications.  Relevant Imaging Results:  Relevant Lab Results:   Additional Information SSN: [id number]  Oletta Lamas, MSW, [facility] Transitions of Care  Clinical Social Worker I

## 2022-11-04 NOTE — Progress Notes (Signed)
Patient ID: Kayla [name], female   DOB: [birth year], 82 y.o.   MRN: [id number] S: Pt not feeling well.  Admits to feeling dizzy upon standing.  BUN/Cr continue to climb. O:BP (!) 128/45 (BP Location: Left Arm)   Pulse (!) 43   Temp (!) 97.4 F (36.3 C) (Oral)   Resp 17   Ht 5\' 5"  (1.651 m)   Wt 103.6 kg   SpO2 97%   BMI 38.01 kg/m   Intake/Output Summary (Last 24 hours) at 11/04/2022 1914 Last data filed at 11/04/2022 0326 Gross per 24 hour  Intake --  Output 250 ml  Net -250 ml   Intake/Output: I/O last 3 completed shifts: In: -  Out: 250 [Urine:250]  Intake/Output this shift:  No intake/output data recorded. Weight change:  Gen: chronically ill-appearing. CVS: bradycardic Resp: CTA  Abd:+BS, soft, NT/ND Ext: trace pretibial edema bilaterally  Recent Labs  Lab 10/29/22 1325 10/30/22 0017 10/31/22 0016 11/01/22 0021 11/02/22 0016 11/03/22 0013 11/04/22 0004  NA 137 137 137 136 136 136 133*  K 4.2 4.3 4.2 4.0 4.5 4.5 4.4  CL 98 96* 99 99 99 99 96*  CO2 25 26 25 27 26 25 25   GLUCOSE 154* 176* 163* 162* 230* 198* 195*  BUN 73* 67* 67* 62* 67* 73* 74*  CREATININE 3.26* 3.34* 3.18* 2.95* 3.37* 3.89* 4.28*  ALBUMIN 2.4* 2.0* 2.4* 2.3* 2.2* 2.3* 2.3*  CALCIUM 8.4* 8.5* 8.7* 8.8* 8.7* 8.6* 8.4*  PHOS 6.2* 6.3* 5.7* 5.4* 6.6* 6.4* 6.2*   Liver Function Tests: Recent Labs  Lab 11/02/22 0016 11/03/22 0013 11/04/22 0004  ALBUMIN 2.2* 2.3* 2.3*   No results for input(s): "LIPASE", "AMYLASE" in the last 168 hours. No results for input(s): "AMMONIA" in the last 168 hours. CBC: Recent Labs  Lab 10/31/22 0016 11/01/22 0021 11/02/22 0016 11/03/22 0013 11/04/22 0004  WBC 11.9* 12.4* 12.6* 13.5* 11.7*  HGB 7.5* 7.4* 7.7* 8.0* 7.3*  HCT 25.0* 24.8* 26.8* 27.6* 25.0*  MCV 95.1 96.9 100.0 99.6 100.8*  PLT 365 363 355 322 374   Cardiac Enzymes: No results for input(s): "CKTOTAL", "CKMB", "CKMBINDEX", "TROPONINI" in the last 168 hours. CBG: Recent Labs  Lab  11/03/22 0619 11/03/22 1207 11/03/22 1541 11/03/22 2111 11/04/22 0702  GLUCAP 185* 184* 189* 225* 160*    Iron Studies: No results for input(s): "IRON", "TIBC", "TRANSFERRIN", "FERRITIN" in the last 72 hours. Studies/Results: No results found.  amLODipine  10 mg Oral Daily   apixaban  2.5 mg Oral BID   atorvastatin  40 mg Oral Daily   bisacodyl  10 mg Rectal Daily   Or   bisacodyl  10 mg Oral Daily   darbepoetin (ARANESP) injection - NON-DIALYSIS  60 mcg Subcutaneous Q Wed-1800   feeding supplement (NEPRO CARB STEADY)  237 mL Oral BID BM   guaiFENesin  600 mg Oral BID   insulin aspart  0-6 Units Subcutaneous TID WC   isosorbide mononitrate  30 mg Oral Daily   levothyroxine  200 mcg Oral Q0600   lidocaine  1 patch Transdermal Q24H   multivitamin  1 tablet Oral QHS   mouth rinse  15 mL Mouth Rinse 4 times per day   sevelamer carbonate  800 mg Oral TID WC   sodium chloride flush  3 mL Intravenous Q12H   torsemide  40 mg Oral Daily    BMET    Component Value Date/Time   NA 133 (L) 11/04/2022 0004   NA 140 02/16/2022 1008  K 4.4 11/04/2022 0004   CL 96 (L) 11/04/2022 0004   CO2 25 11/04/2022 0004   GLUCOSE 195 (H) 11/04/2022 0004   BUN 74 (H) 11/04/2022 0004   BUN 47 (H) 02/16/2022 1008   CREATININE 4.28 (H) 11/04/2022 0004   CREATININE 2.10 (H) 10/07/2022 0939   CALCIUM 8.4 (L) 11/04/2022 0004   CALCIUM 9.9 06/04/2008 2240   GFRNONAA 10 (L) 11/04/2022 0004   GFRAA 45 (L) 02/16/2020 1124   CBC    Component Value Date/Time   WBC 11.7 (H) 11/04/2022 0004   RBC 2.48 (L) 11/04/2022 0004   HGB 7.3 (L) 11/04/2022 0004   HGB 11.3 01/30/2021 1154   HCT 25.0 (L) 11/04/2022 0004   HCT 35.9 01/30/2021 1154   PLT 374 11/04/2022 0004   PLT 389 01/30/2021 1154   MCV 100.8 (H) 11/04/2022 0004   MCV 91 01/30/2021 1154   MCH 29.4 11/04/2022 0004   MCHC 29.2 (L) 11/04/2022 0004   RDW 18.6 (H) 11/04/2022 0004   RDW 13.4 01/30/2021 1154   LYMPHSABS 1.4 10/25/2022 0730    MONOABS 1.3 (H) 10/25/2022 0730   EOSABS 0.1 10/25/2022 0730   BASOSABS 0.0 10/25/2022 0730    Assessment/Plan:  Acute kidney injury on CKD stage IV likely due to CHF exacerbation/fluid overload/bradycardia leading to reduced renal perfusion.  B/L Cr around 2, followed by Dr. Noah Charon as an outpatient. She had hyperkalemia, anasarca and did not respond with IV diuretics.  Also had features of uremic encephalopathy including tremor, AMS. -temp HD catheter placed on 6/23, started CRRT 6/23. CRRT off on 6/24 given access issues/alarms. RIJ temp line removed 6/26. Uremic symptoms improved at that time  -Cr down to 2.95 but rose to 3.83 yesterday and up to 4.28 this morning.  Resumed her outpatient torsemide dose of 40 mg daily but worsening UOP and BUN/Cr.   - Will consult IR to [facility] and resume HD, as this may be ESRD at this point.  -Side note: discussed with patient and husband and she would want to pursue dialysis in the long term. She is interested in home therapies down the road if/when indicated.  She is a poor longterm HD candidate given her advanced age, multiple co-morbidities, and poor functional and nutritional status.   -normal renal artery duplex today, no evidence of RAS -daily labs, strict I/O -hold off on discharge for now until Scr stabilizes.    Hyperkalemia: Received medical treatment in the ER.  Improved with CRRT (currently off). On lasix + renal diet. K WNL now   Bradycardia: Seen by cardiology during admit. H/o PPM extraction secondary to bacteremia (Sep 07, 2022).  Off dopamine gtt, HR stable. Avoid beta blockers   Acute on chronic respiratory failure/respiratory distress with hypoxia: lasix as above. CXR 6/25 revealed improvement in opacities. On 3L [place] at baseline @ home -improved.   Sepsis due to possible pneumonia: On antibiotics per primary team.   Hyponatremia, mild: likely secondary to AKI and hypervolemia. WNL now   Anemia: transfuse for Hgb <7. ESA started  6/26, will increase dose of aranesp to 100 mcg. 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  Will start binders, c/w renal diet  Irena Cords, MD [facility]

## 2022-11-04 NOTE — Procedures (Signed)
Interventional Radiology Procedure Note  Procedure: Right internal jugular 19 cm Palindrome TDC placement.  Catheter ready for use.  Complications: None  Estimated Blood Loss: None  Recommendations:  - Routine line care    Signed,  Sterling Big, MD

## 2022-11-04 NOTE — TOC Initial Note (Signed)
Transition of Care [facility]) - Initial/Assessment Note    Patient Details  Name: Kayla [name] MRN: [id number] Date of Birth: 05/06/1940  [facility] CM/SW Contact:    Leander Rams, LCSW Phone Number: 11/04/2022, 11:28 AM  Clinical Narrative:                 CSW met with pt at bedside alongside spouse Fayrene Fearing to discuss PT recommendation for SNF. Pt understands the need for STR and is agreeable to go. Pt reports she did STR at [facility] in May and would like to return. Pt and spouse request referrals be sent to SNF in [place] and [place] but main choice is [facility].  CSW will complete fl2 and fax out. TOC will continue to follow.  Expected Discharge Plan: Skilled Nursing Facility Barriers to Discharge: Continued Medical Work up   Patient Goals and CMS Choice Patient states their goals for this hospitalization and ongoing recovery are:: Wants to go to [facility] SNF CMS Medicare.gov Compare Post Acute Care list provided to:: Patient Choice offered to / list presented to : Patient      Expected Discharge Plan and Services   Discharge Planning Services: CM Consult Post Acute Care Choice: Home Health Living arrangements for the past 2 months: Single Family Home                 DME Arranged: Wheelchair manual DME Agency: [employer] Date DME Agency Contacted: 11/02/22 Time DME Agency Contacted: 1444 Representative spoke with at DME Agency: Zack HH Arranged: RN, PT HH Agency: [facility] Date [facility] Agency Contacted: 10/29/22 Time HH Agency Contacted: 1525 Representative spoke with at [facility] Agency: Haywood Lasso  Prior Living Arrangements/Services Living arrangements for the past 2 months: Single Family Home Lives with:: Spouse Patient language and need for interpreter reviewed:: Yes Do you feel safe going back to the place where you live?: Yes      Need for Family Participation in Patient Care: Yes (Comment) Care giver support system in place?: Yes  (comment) Current home services: DME Criminal Activity/Legal Involvement Pertinent to Current Situation/Hospitalization: No - Comment as needed  Activities of Daily Living      Permission Sought/Granted Permission sought to share information with : Case Manager, Family Supports, PCP Permission granted to share information with : Yes, Verbal Permission Granted  Share Information with NAME: Avianca Grainger  Permission granted to share info w AGENCY: Home Health, DME  Permission granted to share info w Relationship: Spouse  Permission granted to share info w Contact Information: [phone]  Emotional Assessment Appearance:: Developmentally appropriate Attitude/Demeanor/Rapport: Engaged Affect (typically observed): Accepting Orientation: : Oriented to Self, Oriented to Place, Oriented to  Time, Oriented to Situation Alcohol / Substance Use: Not Applicable Psych Involvement: No (comment)  Admission diagnosis:  Hyperkalemia [E87.5] Bradycardia [R00.1] Symptomatic bradycardia [R00.1] AKI (acute kidney injury) (HCC) [N17.9] Acute respiratory failure with hypoxia and hypercapnia (HCC) [J96.01, J96.02] Patient Active Problem List   Diagnosis Date Noted   Acute respiratory failure with hypoxia and hypercapnia (HCC) 10/25/2022   Junctional bradycardia 10/25/2022   Hyperkalemia 10/25/2022   Bradycardia 10/01/2022   MRSA bacteremia 10/01/2022   AKI (acute kidney injury) (HCC) 10/01/2022   Complication of central venous catheter 10/01/2022   Osteomyelitis (HCC) 10/01/2022   Closed nondisplaced fracture of proximal phalanx of right great toe 09/06/2022   Pyogenic inflammation of bone (HCC) 09/05/2022   Acute hematogenous osteomyelitis of left foot (HCC) 09/04/2022   Diabetic foot infection (HCC)  09/03/2022   Chronic respiratory failure with hypoxia (HCC) 02/20/2022   Pleuritic pain 02/20/2022   Toe osteomyelitis, left (HCC) 01/20/2022   Carotid bruit 07/07/2021   Septic prepatellar  bursitis of right knee 05/30/2021   Cellulitis of right lower extremity 03/19/2021   Chronic kidney disease, stage 3b (HCC) 03/19/2021   Polymyalgia rheumatica (HCC) 04/15/2020   Temporal arteritis (HCC) 04/15/2020   Pacemaker - MDT 01/24/2020   Orthostatic lightheadedness 01/24/2020   Chronic osteomyelitis involving left ankle and foot (HCC)    Paroxysmal atrial fibrillation (HCC) 10/25/2019   Secondary hypercoagulable state (HCC) 10/25/2019   Numbness and tingling of right leg 10/19/2019   Lumbar radiculopathy 10/19/2019   Chronic diastolic heart failure (HCC) 08/13/2019   Chest pain 08/13/2019   Leukocytosis 08/13/2019   Leg swelling 12/09/2018   Urinary frequency 12/09/2018   Pain in joint of right hip 01/21/2018   Myofascial pain syndrome, cervical 01/06/2018   Occipital neuralgia of right side 01/06/2018   RUQ abdominal pain    Gastritis and gastroduodenitis    Bile duct abnormality    Cholecystitis 04/30/2016   Gall bladder disease 04/28/2016   Sinus node dysfunction (HCC) 11/08/2015   Ejection fraction    Spinal stenosis of cervical region 11/07/2014   Preoperative clearance 10/09/2014   Carotid artery disease (HCC)    S/P left TKA 07/27/2011   Dizzy spells 05/04/2011   Palpitations 01/21/2011   RHINITIS 07/21/2010   Hyperlipidemia associated with type 2 diabetes mellitus (HCC) 11/24/2008   OBESITY 11/24/2008   Coronary atherosclerosis 11/24/2008   VENTRICULAR HYPERTROPHY, LEFT 11/24/2008   HYPOTHYROIDISM, POSTSURGICAL 06/04/2008   Headache 06/04/2008   Insulin dependent type 2 diabetes mellitus (HCC) 03/23/2007   Esophageal reflux 03/23/2007   DIVERTICULOSIS OF COLON 03/23/2007   GENERALIZED OSTEOARTHROSIS UNSPECIFIED SITE 03/23/2007   OSA (obstructive sleep apnea) 02/16/2007   Hypertension associated with diabetes (HCC) 02/16/2007   COPD (chronic obstructive pulmonary disease) (HCC) 02/16/2007   PCP:  Ailene Ravel, MD Pharmacy:   [facility] [phone] [place], [place] - [address] AT [facility] [address] [place] [address] Phone: [phone] Fax: [phone]     Social Determinants of Health (SDOH) Social History: SDOH Screenings   Food Insecurity: No Food Insecurity (11/03/2022)  Housing: Patient Declined (11/03/2022)  Transportation Needs: No Transportation Needs (11/03/2022)  Utilities: Not At Risk (11/03/2022)  Alcohol Screen: Low Risk  (01/31/2021)  Depression (PHQ2-9): Low Risk  (10/15/2022)  Financial Resource Strain: Low Risk  (01/31/2021)  Physical Activity: Insufficiently Active (09/30/2021)  Social Connections: Socially Integrated (09/30/2021)  Stress: No Stress Concern Present (09/30/2021)  Tobacco Use: Medium Risk (10/25/2022)   SDOH Interventions:     Readmission Risk Interventions    10/01/2022    3:52 PM 01/22/2022    2:43 PM 07/23/2021   12:10 PM  Readmission Risk Prevention Plan  Transportation Screening Complete Complete Complete  PCP or Specialist Appt within 3-5 Days Complete Complete   HRI or Home Care Consult  Complete   Social Work Consult for Recovery Care Planning/Counseling Complete Complete   Palliative Care Screening Not Applicable Not Applicable   Medication Review Oceanographer) Referral to Pharmacy Complete Complete  HRI or Home Care Consult   Complete  SW Recovery Care/Counseling Consult   Complete  Palliative Care Screening   Not Applicable  Skilled Nursing Facility   Complete   Oletta Lamas, MSW, [place], [place] Transitions of Care  Clinical Social Worker I

## 2022-11-04 NOTE — Progress Notes (Signed)
Mobility Specialist Progress Note:    11/04/22 1500  Mobility  Activity Refused mobility   Pt refused mobility d/t pain in side. Will f/u as able.    Leory Plowman  Mobility Specialist Please contact via [employer] office at [phone]

## 2022-11-04 NOTE — Progress Notes (Signed)
PT Cancellation Note  Patient Details Name: Kayla [name] MRN: [id number] DOB: August 09, 1940   Cancelled Treatment:    Reason Eval/Treat Not Completed: Patient at procedure or test/unavailable (Pt in interventional radiology.)   [name] 11/04/2022, 1:16 PM [name],PT Acute Rehab Services [phone]

## 2022-11-04 NOTE — Consult Note (Signed)
Chief Complaint: Patient was seen in consultation today for tunneled dialysis catheter placement  Referring Physician(s): Terrial Rhodes, MD  Supervising Physician: Roanna Banning  Patient Status: [facility] - In-pt  History of Present Illness: Kayla [name] is a 82 y.o. female with complex PMH including anxiety, atrial fibrillation, CAD, CHF, COPD, diabetes mellitus, hypertension, and sleep apnea being seen today in relation to her AKI with CKD Stage IV. Patient has been evaluated by Dr Arrie Aran from Nephrology service and has previously had temporary HD catheter placed 6/23 and removed 10/28/22. IR has been consulted for tunneled dialysis catheter placement to facilitate hemodialysis.  Past Medical History:  Diagnosis Date   Allergy    Anxiety    Arthritis    CAD (coronary artery disease)    Mild per remote cath in 2006, /    Nuclear, January, 2013, no significant abnormality,   Carotid artery disease (HCC)    Doppler, January, 2013, 0 39% RIC A., 40-59% LICA, stable   Cataract    CHF (congestive heart failure) (HCC)    Chronic kidney disease    Complication of anesthesia    wakes up "mean"   COPD 02/16/2007   Intolerant of Spiriva, tudorza Intolerant of Trelegy    COPD (chronic obstructive pulmonary disease) (HCC)    Coronary atherosclerosis 11/24/2008   Catheterization 2006, nonobstructive coronary disease   //   nuclear 2013, low risk     Diabetes mellitus, type 2 (HCC)    Diverticula of colon    DIVERTICULOSIS OF COLON 03/23/2007   Qualifier: Diagnosis of  By: Craige Cotta MD, Vineet     Dizziness    occasional   Dizzy spells 05/04/2011   DM (diabetes mellitus) (HCC) 03/23/2007   Qualifier: Diagnosis of  By: Craige Cotta MD, Vineet     Dyspnea    On exertion   Ejection fraction    Essential hypertension 02/16/2007   Qualifier: Diagnosis of  By: Earlene Plater CMA, Tammy     G E R D 03/23/2007   Qualifier: Diagnosis of  By: Craige Cotta MD, Vineet     Gall bladder disease 04/28/2016    Gastritis and gastroduodenitis    GERD (gastroesophageal reflux disease)    Goiter    hx of   Headache(784.0)    migraine   Hyperlipidemia    Hypertension    Hypothyroidism    HYPOTHYROIDISM, POSTSURGICAL 06/04/2008   Qualifier: Diagnosis of  By: Everardo All MD, Sean A    Left ventricular hypertrophy    Leg edema    occasional swelling   Leg swelling 12/09/2018   Myofascial pain syndrome, cervical 01/06/2018   Obesity    OBESITY 11/24/2008   Qualifier: Diagnosis of  By: Vikki Ports     OBSTRUCTIVE SLEEP APNEA 02/16/2007   Auto CPAP 09/03/13 to 10/02/13 >> used on 30 of 30 nights with average 6 hrs and 3 min.  Average AHI is 3.1 with median CPAP 5 cm H2O and 95 th percentile CPAP 6 cm H2O.     Occipital neuralgia of right side 01/06/2018   OSA (obstructive sleep apnea)    cpap setting of 12   Pain in joint of right hip 01/21/2018   Palpitations 01/21/2011   48 hour Holter, 2015, scattered PACs and PVCs.    Paroxysmal atrial fibrillation (HCC)    Pneumonia    PONV (postoperative nausea and vomiting)    severe after every surgery   Pulmonary hypertension, secondary    RHINITIS 07/21/2010  RUQ abdominal pain    S/P left TKA 07/27/2011   Sinus node dysfunction (HCC) 11/08/2015   Sleep apnea    Spinal stenosis of cervical region 11/07/2014   Urinary frequency 12/09/2018   UTI (urinary tract infection) 08/15/2019   per pt report   VENTRICULAR HYPERTROPHY, LEFT 11/24/2008   Qualifier: Diagnosis of  By: Vikki Ports      Past Surgical History:  Procedure Laterality Date   ABDOMINAL HYSTERECTOMY     with appendectomy and colon polypectomy   AMPUTATION TOE Left 12/15/2019   Procedure: AMPUTATION  LEFT GREAT TOE;  Surgeon: Park Liter, DPM;  Location: [facility];  Service: Podiatry;  Laterality: Left;   AMPUTATION TOE Left 01/22/2022   Procedure: AMPUTATION TOE 4th digit;  Surgeon: Candelaria Stagers, DPM;  Location: [facility];  Service: Podiatry;  Laterality: Left;    AMPUTATION TOE Left 09/04/2022   Procedure: LEFT BIG TOE AMPUTAION;  Surgeon: Vivi Barrack, DPM;  Location: [facility];  Service: Podiatry;  Laterality: Left;   ANTERIOR CERVICAL DECOMP/DISCECTOMY FUSION N/A 11/07/2014   Procedure: Anterior Cervical diskectomy and fusion Cervical four-five, Cervical five-six, Cervical six-seven;  Surgeon: Donalee Citrin, MD;  Location: [facility];  Service: Neurosurgery;  Laterality: N/A;   APPENDECTOMY     ARTERY BIOPSY Right 02/16/2017   Procedure: BIOPSY TEMPORAL ARTERY;  Surgeon: Renford Dills, MD;  Location: [facility];  Service: Vascular;  Laterality: Right;   ARTERY BIOPSY Left 04/16/2020   Procedure: BIOPSY TEMPORAL ARTERY;  Surgeon: Renford Dills, MD;  Location: [facility];  Service: Vascular;  Laterality: Left;  LEFT TEMPLE   BACK SURGERY     CARDIAC CATHETERIZATION     CATARACT EXTRACTION W/ INTRAOCULAR LENS  IMPLANT, BILATERAL Bilateral    CHOLECYSTECTOMY N/A 04/29/2016   Procedure: LAPAROSCOPIC CHOLECYSTECTOMY WITH INTRAOPERATIVE CHOLANGIOGRAM;  Surgeon: Ovidio Kin, MD;  Location: [facility];  Service: General;  Laterality: N/A;   EP IMPLANTABLE DEVICE N/A 11/08/2015   MRI COMPATABLE -- Procedure: Pacemaker Implant;  Surgeon: Duke Salvia, MD;  Location: [facility];  Service: Cardiovascular;  Laterality: N/A;   INCISION AND DRAINAGE ABSCESS Right 05/30/2021   Procedure: IRRIGATION AND DEBRIDEMENT OF PREPATELLA BURSA;  Surgeon: Durene Romans, MD;  Location: [facility];  Service: Orthopedics;  Laterality: Right;  60 wants standard knee draping and pulse lavage   INCISION AND DRAINAGE ABSCESS Right 07/22/2021   Procedure: INCISION AND DRAINAGE ABSCESS RIGHT KNEE;  Surgeon: Durene Romans, MD;  Location: [facility];  Service: Orthopedics;  Laterality: Right;   INSERT / REPLACE / REMOVE PACEMAKER  2017   Dr. Clide [facility])   IR FLUORO GUIDE CV LINE RIGHT  07/25/2021   IR REMOVAL TUN CV CATH W/O FL  09/04/2021   IR US GUIDE VASC ACCESS RIGHT  07/25/2021    JOINT REPLACEMENT     LEAD EXTRACTION N/A 09/08/2022   Procedure: LEAD EXTRACTION;  Surgeon: Marinus Maw, MD;  [facility]: [facility];  Service: Cardiovascular;  Laterality: N/A;   LEAD EXTRACTION N/A 09/11/2022   Procedure: LEAD EXTRACTION;  Surgeon: Marinus Maw, MD;  [facility]: [facility];  Service: Cardiovascular;  Laterality: N/A;   LUMBAR DISC SURGERY  1990's   x 2   TEE WITHOUT CARDIOVERSION N/A 09/07/2022   Procedure: TRANSESOPHAGEAL ECHOCARDIOGRAM;  Surgeon: Christell Constant, MD;  [facility]: [facility];  Service: Cardiovascular;  Laterality: N/A;   THYROIDECTOMY  2011   TOTAL KNEE ARTHROPLASTY  07/27/2011  Procedure: TOTAL KNEE ARTHROPLASTY;  Surgeon: Shelda Pal, MD;  Location: [facility];  Service: Orthopedics;  Laterality: Left;   UPPER ESOPHAGEAL ENDOSCOPIC ULTRASOUND (EUS) N/A 05/14/2016   Procedure: UPPER ESOPHAGEAL ENDOSCOPIC ULTRASOUND (EUS);  Surgeon: Rachael Fee, MD;  Location: [facility];  Service: Endoscopy;  Laterality: N/A;  radial only-will be done mod if needed    Allergies: Dulaglutide, Oxycodone, and Trelegy ellipta [fluticasone-umeclidin-vilant]  Medications: Prior to Admission medications   Medication Sig Start Date End Date Taking? Authorizing Provider  acetaminophen (TYLENOL) 500 MG tablet Take 1,000-1,500 mg by mouth every 8 (eight) hours as needed for mild pain, moderate pain or headache.   Yes [provider]  albuterol (PROVENTIL) (2.5 MG/3ML) 0.083% nebulizer solution Take 3 mLs (2.5 mg total) by nebulization every 2 (two) hours as needed for wheezing or shortness of breath. 07/03/22  Yes Parrett, Tammy S, NP  albuterol (VENTOLIN HFA) 108 (90 Base) MCG/ACT inhaler Inhale 2 puffs into the lungs every 6 (six) hours as needed for wheezing or shortness of breath. 07/03/22  Yes Parrett, Tammy S, NP  allopurinol (ZYLOPRIM) 100 MG tablet Take 100 mg by mouth 2 (two) times daily. 04/16/19  Yes [provider]   amiodarone (PACERONE) 200 MG tablet Take 1 tablet (200 mg total) by mouth daily. 05/22/22  Yes Jake Bathe, MD  amLODipine (NORVASC) 5 MG tablet Take 2 tablets (10 mg total) by mouth daily. Patient taking differently: Take 5 mg by mouth daily. 09/15/22  Yes Pokhrel, Laxman, MD  ascorbic acid (VITAMIN C) 500 MG tablet Take 500 mg by mouth daily with lunch.   Yes [provider]  atorvastatin (LIPITOR) 40 MG tablet Take 1 tablet (40 mg total) by mouth daily. Patient taking differently: Take 40 mg by mouth at bedtime. 05/22/22  Yes Jake Bathe, MD  Budeson-Glycopyrrol-Formoterol (BREZTRI AEROSPHERE) 160-9-4.8 MCG/ACT AERO Inhale 2 puffs into the lungs in the morning and at bedtime. 07/03/22  Yes Parrett, Tammy S, NP  Carboxymethylcellulose Sodium (ARTIFICIAL TEARS OP) Place 1 drop into both eyes daily as needed (dry eyes).   Yes [provider]  ELIQUIS 2.5 MG TABS tablet TAKE 1 TABLET TWICE DAILY 04/20/22  Yes Jake Bathe, MD  ferrous sulfate 325 (65 FE) MG tablet Take 1 tablet (325 mg total) by mouth daily with breakfast. 01/26/20  Yes Duke Salvia, MD  fluticasone (FLONASE) 50 MCG/ACT nasal spray USE 2 SPRAYS NASALLY DAILY Patient taking differently: Place 1 spray into both nostrils daily as needed for allergies. 02/26/17  Yes Leslye Peer, MD  gabapentin (NEURONTIN) 800 MG tablet Take 800 mg by mouth 3 (three) times daily.   Yes [provider]  guaiFENesin (ROBITUSSIN) 100 MG/5ML liquid Take 5 mLs by mouth every 4 (four) hours as needed for cough or to loosen phlegm.   Yes [provider]  HYDROcodone-acetaminophen (NORCO) 10-325 MG tablet Take 1 tablet by mouth every 6 (six) hours as needed. Patient taking differently: Take 1 tablet by mouth every 6 (six) hours as needed for severe pain. 09/15/22  Yes Pokhrel, Laxman, MD  HYDROXYZINE HCL PO Take 25 mg by mouth at bedtime as needed (insomnia).   Yes [provider]  insulin aspart (NOVOLOG)  100 UNIT/ML FlexPen Inject 0-20 Units into the skin See admin instructions. Inject 0-20 units into the skin three times a day before meals "as needed," per SLIDING SCALE   Yes [provider]  insulin glargine (LANTUS) 100 UNIT/ML injection Inject 0.55  mLs (55 Units total) into the skin 2 (two) times daily. Patient taking differently: Inject 30-90 Units into the skin See admin instructions.   Patient report 90 units in the morning 30 units at bedtime, was adamant regarding dosage. 07/27/21  Yes Regalado, Belkys A, MD  levothyroxine (SYNTHROID) 200 MCG tablet Take 200 mcg by mouth daily before breakfast.   Yes [provider]  loratadine (CLARITIN) 10 MG tablet Take 1 tablet (10 mg total) by mouth daily. Patient taking differently: Take 10 mg by mouth daily as needed. 09/16/22  Yes Pokhrel, Laxman, MD  losartan (COZAAR) 25 MG tablet Take 25 mg by mouth daily. 03/07/22  Yes [provider]  melatonin 5 MG TABS Take 1 tablet (5 mg total) by mouth at bedtime as needed. 09/15/22  Yes Pokhrel, Laxman, MD  nitroGLYCERIN (NITROSTAT) 0.4 MG SL tablet Place 1 tablet (0.4 mg total) under the tongue every 5 (five) minutes as needed for chest pain. 04/13/19  Yes Jake Bathe, MD  polyethylene glycol (MIRALAX / GLYCOLAX) 17 g packet Take 17 g by mouth daily as needed for mild constipation or moderate constipation. 09/15/22  Yes Pokhrel, Laxman, MD  PRESCRIPTION MEDICATION CPAP- At bedtime and as needed during any time of rest   Yes [provider]  torsemide (DEMADEX) 20 MG tablet Take 2 tablets (40 mg total) by mouth daily. Patient taking differently: Take 80 mg by mouth daily. 09/15/22  Yes Pokhrel, Laxman, MD  vitamin B-12 (CYANOCOBALAMIN) 1000 MCG tablet Take 1,000 mcg by mouth daily with lunch.   Yes [provider]  Vitamin D3 (VITAMIN D) 25 MCG tablet Take 1,000 Units by mouth daily with lunch.   Yes [provider]  Alcohol Swabs (B-D SINGLE USE SWABS  REGULAR) PADS  11/24/19   [provider]  Blood Glucose Calibration (TRUE METRIX LEVEL 1) Low SOLN  11/27/19   [provider]  TRUE METRIX BLOOD GLUCOSE TEST test strip  09/18/17   [provider]  TRUEplus Lancets 28G MISC  11/24/19   [provider]     Family History  Problem Relation Age of Onset   Heart Problems Mother    Heart defect Mother        valve problems   Heart Problems Father    Heart disease Brother    Lung cancer Daughter        non small cell    Diabetes Maternal Aunt    Diabetes Maternal Uncle    Throat cancer Brother    Colon cancer Neg Hx    Colon polyps Neg Hx    Esophageal cancer Neg Hx    Rectal cancer Neg Hx    Stomach cancer Neg Hx     Social History   Socioeconomic History   Marital status: Married    Spouse name: Josselynn Feese   Number of children: 3   Years of education: 12   Highest education level: Not on file  Occupational History   Occupation: retired  Tobacco Use   Smoking status: Former    Packs/day: 2.00    Years: 33.00    Additional pack years: 0.00    Total pack years: 66.00    Types: Cigarettes    Start date: 1966    Quit date: 05/04/1997    Years since quitting: 25.5   Smokeless tobacco: Never  Vaping Use   Vaping Use: Never used  Substance and Sexual Activity   Alcohol use: No   Drug use:  No   Sexual activity: Not on file  Other Topics Concern   Not on file  Social History Narrative   Patient lives at home with her husband Fayrene Fearing .   Retired.   Education 12 th grade.   Right handed   Caffeine two cups of coffee.   Social Determinants of Health   Financial Resource Strain: Low Risk  (01/31/2021)   Overall Financial Resource Strain (CARDIA)    Difficulty of Paying Living Expenses: Not very hard  Food Insecurity: No Food Insecurity (11/03/2022)   Hunger Vital Sign    Worried About Running Out of Food in the Last Year: Never true    Ran Out of Food in the Last Year: Never true   Transportation Needs: No Transportation Needs (11/03/2022)   PRAPARE - Administrator, Civil Service (Medical): No    Lack of Transportation (Non-Medical): No  Physical Activity: Insufficiently Active (09/30/2021)   Exercise Vital Sign    Days of Exercise per Week: 5 days    Minutes of Exercise per Session: 20 min  Stress: No Stress Concern Present (09/30/2021)   [employer] of Occupational Health - Occupational Stress Questionnaire    Feeling of Stress : Not at all  Social Connections: Socially Integrated (09/30/2021)   Social Connection and Isolation Panel [NHANES]    Frequency of Communication with Friends and Family: More than three times a week    Frequency of Social Gatherings with Friends and Family: Twice a week    Attends Religious Services: 1 to 4 times per year    Active Member of [employer] or Organizations: Yes    Attends Banker Meetings: 1 to 4 times per year    Marital Status: Married    Patient currently has DNR order in place. Discussion with the patient and family regarding wishes.  The DNR order is rescinded during the procedure and the patient consents to the use of any resuscitation procedure needed to treat the clinical events that occur.   Review of Systems: A 12 point ROS discussed and pertinent positives are indicated in the HPI above.  All other systems are negative.  Review of Systems  Constitutional:  Negative for chills and fever.  Respiratory:  Positive for shortness of breath. Negative for chest tightness.   Cardiovascular:  Positive for leg swelling. Negative for chest pain.  Gastrointestinal:  Negative for abdominal pain, diarrhea, nausea and vomiting.  Neurological:  Positive for dizziness. Negative for headaches.  Psychiatric/Behavioral:  Negative for confusion.     Vital Signs: BP (!) 127/44 (BP Location: Left Arm)   Pulse (!) 48   Temp (!) 97.4 F (36.3 C) (Oral)   Resp 19   Ht 5\' 5"  (1.651 m)   Wt 228 lb 6.3 oz  (103.6 kg)   SpO2 97%   BMI 38.01 kg/m   Advance Care Plan: The advanced care plan/surrogate decision maker was discussed at the time of visit and documented in the medical record.  Patient identifies husband, Kayla [name], as Runner, broadcasting/film/video.   Physical Exam Vitals reviewed.  Constitutional:      General: She is not in acute distress.    Appearance: She is ill-appearing.  Cardiovascular:     Rate and Rhythm: Regular rhythm. Bradycardia present.     Pulses: Normal pulses.     Heart sounds: Normal heart sounds.  Pulmonary:     Breath sounds: Normal breath sounds.     Comments: Patient on O2 via nasal  cannula Musculoskeletal:     Right lower leg: Edema present.     Left lower leg: Edema present.     Comments: 1+ pitting edema in bilateral lower extremities  Skin:    General: Skin is warm and dry.  Neurological:     Mental Status: She is alert and oriented to person, place, and time.  Psychiatric:        Mood and Affect: Mood normal.        Behavior: Behavior normal.        Thought Content: Thought content normal.        Judgment: Judgment normal.     Imaging: DG Ribs Unilateral Left  Result Date: 11/01/2022 CLINICAL DATA:  Left rib fracture. EXAM: LEFT RIBS - 2 VIEW COMPARISON:  Chest x-ray 10/31/2022 FINDINGS: Minimally displaced posterolateral left seventh rib fracture. Remainder of the exam is unchanged. IMPRESSION: Minimally displaced posterolateral left seventh rib fracture. Electronically Signed   By: Elberta Fortis M.D.   On: 11/01/2022 15:23   VAS US RENAL ARTERY DUPLEX  Result Date: 11/01/2022 ABDOMINAL VISCERAL Patient Name:  FERREL TURCO  Date of Exam:   11/01/2022 Medical Rec #: [id number]         Accession #:    [id number] Date of Birth: 1940/05/09        Patient Gender: F Patient Age:   76 years Exam Location:  [facility] Procedure:      VAS US RENAL ARTERY DUPLEX Referring Phys: [id number] SYLVESTER I OGBATA  -------------------------------------------------------------------------------- Indications: Stenosis, renal I70.1 High Risk Factors: Hypertension, hyperlipidemia, Diabetes. Limitations: Air/bowel gas, obesity, patient discomfort and patient positioning, patient constant movement, respiratory disturbance. Comparison Study: No prior studies. Performing Technologist: Chanda Busing RVT  Examination Guidelines: A complete evaluation includes B-mode imaging, spectral Doppler, color Doppler, and power Doppler as needed of all accessible portions of each vessel. Bilateral testing is considered an integral part of a complete examination. Limited examinations for reoccurring indications may be performed as noted.  Duplex Findings: +--------------------+--------+--------+------+------------------+ Mesenteric          PSV cm/sEDV cm/sPlaque     Comments      +--------------------+--------+--------+------+------------------+ Aorta Mid             221      22                            +--------------------+--------+--------+------+------------------+ Celiac Artery Origin                      Unable to insonate +--------------------+--------+--------+------+------------------+ SMA Origin                                Unable to insonate +--------------------+--------+--------+------+------------------+    +------------------+--------+--------+-------+ Right Renal ArteryPSV cm/sEDV cm/sComment +------------------+--------+--------+-------+ Origin               19      8            +------------------+--------+--------+-------+ Proximal             24      8            +------------------+--------+--------+-------+ Mid                  42      9            +------------------+--------+--------+-------+ Distal  37      10           +------------------+--------+--------+-------+ +-----------------+--------+--------+-------+ Left Renal ArteryPSV cm/sEDV cm/sComment  +-----------------+--------+--------+-------+ Origin              59      9            +-----------------+--------+--------+-------+ Proximal            70      5            +-----------------+--------+--------+-------+ Mid                 67      10           +-----------------+--------+--------+-------+ Distal              41      6            +-----------------+--------+--------+-------+ +------------+--------+--------+----+-----------+--------+--------+----+ Right KidneyPSV cm/sEDV cm/sRI  Left KidneyPSV cm/sEDV cm/sRI   +------------+--------+--------+----+-----------+--------+--------+----+ Upper Pole  29      5       0.82Upper Pole 23      4       0.82 +------------+--------+--------+----+-----------+--------+--------+----+ Mid         27      4       0.        21      5       0.77 +------------+--------+--------+----+-----------+--------+--------+----+ Lower Pole  22      4       0.80Lower Pole 25      6       0.76 +------------+--------+--------+----+-----------+--------+--------+----+ Hilar       43      8       0.81Hilar      28      6       0.78 +------------+--------+--------+----+-----------+--------+--------+----+ +------------------+----+------------------+-----+ Right Kidney          Left Kidney             +------------------+----+------------------+-----+ RAR                   RAR                     +------------------+----+------------------+-----+ RAR (manual)      0.19RAR (manual)      0.32  +------------------+----+------------------+-----+ Cortex                Cortex                  +------------------+----+------------------+-----+ Cortex thickness      Corex thickness         +------------------+----+------------------+-----+ Kidney length (cm)9.30Kidney length (cm)10.00 +------------------+----+------------------+-----+  Summary: Renal:  Right: Normal size right kidney. Normal right  Resisitive Index. No        evidence of right renal artery stenosis. Left:  No evidence of left renal artery stenosis. Normal size of        left kidney. Normal left Resistive Index.  *See table(s) above for measurements and observations.  Diagnosing physician: Coral Else MD  Electronically signed by Coral Else MD on 11/01/2022 at 11:17:34 AM.    Final    DG CHEST PORT 1 VIEW  Result Date: 10/31/2022 CLINICAL DATA:  Shortness of breath. EXAM: PORTABLE CHEST 1 VIEW COMPARISON:  10/27/2022 FINDINGS: Patient is slightly rotated to the right. Lungs are adequately inflated and demonstrate mild hazy prominence of the central pulmonary vessels suggesting mild  vascular congestion. There is mild bibasilar opacification likely small amount of bilateral pleural fluid with associated atelectasis. Mild stable cardiomegaly. Remainder of the exam is unchanged. IMPRESSION: Mild vascular congestion with small amount of bilateral pleural fluid and associated bibasilar atelectasis. Infection in the lung bases is possible. Electronically Signed   By: Elberta Fortis M.D.   On: 10/31/2022 08:42   DG CHEST PORT 1 VIEW  Result Date: 10/27/2022 CLINICAL DATA:  Acute hypercapnic respiratory failure. EXAM: PORTABLE CHEST 1 VIEW COMPARISON:  October 25, 2022. FINDINGS: Stable cardiomegaly. Right internal jugular catheter is noted with tip in expected position of the SVC. Slightly decreased bibasilar opacities are noted suggesting improving atelectasis or edema. Small pleural effusions may be present. Bony thorax is unremarkable. IMPRESSION: Improved bibasilar opacities as described above. Electronically Signed   By: Lupita Raider M.D.   On: 10/27/2022 11:30   ECHOCARDIOGRAM LIMITED  Result Date: 10/25/2022    ECHOCARDIOGRAM LIMITED REPORT   Patient Name:   Kayla [name] Date of Exam: 10/25/2022 Medical Rec #:  [id number]        Height:       65.0 in Accession #:    [id number]       Weight:       230.0 lb Date of Birth:   Dec 01, 1940       BSA:          2.099 m Patient Age:    81 years         BP:           88/55 mmHg Patient Gender: F                HR:           38 bpm. Exam Location:  Inpatient Procedure: Limited Echo Indications:    R06.9 DOE  History:        Patient has prior history of Echocardiogram examinations, most                 recent 09/07/2022. COPD, Arrythmias:Atrial Fibrillation; Risk                 Factors:Hypertension, Diabetes, Dyslipidemia and Sleep Apnea.                 Infected Pacemaker lead removed 09/08/22 without re-implant.  Sonographer:    Irving Burton Senior RDCS Referring Phys: [id number] Perlie Gold  Sonographer Comments: Limited for LV function, scanned upright on bipap for dyspnea IMPRESSIONS  1. Left ventricular ejection fraction, by estimation, is 70 to 75%. Left ventricular ejection fraction by PLAX is 66 %. The left ventricle has hyperdynamic function. The left ventricle has no regional wall motion abnormalities. There is mild concentric left ventricular hypertrophy.  2. There is mild calcification of the aortic valve. There is moderate thickening of the aortic valve. FINDINGS  Left Ventricle: Left ventricular ejection fraction, by estimation, is 70 to 75%. Left ventricular ejection fraction by PLAX is 66 %. The left ventricle has hyperdynamic function. The left ventricle has no regional wall motion abnormalities. The left ventricular internal cavity size was normal in size. There is mild concentric left ventricular hypertrophy. Pericardium: There is no evidence of pericardial effusion. Mitral Valve: Mild to moderate mitral annular calcification. Aortic Valve: There is mild calcification of the aortic valve. There is moderate thickening of the aortic valve. LEFT VENTRICLE PLAX 2D LV EF:         Left ventricular ejection fraction by PLAX is 66 %. LVIDd:  4.90 cm LVIDs:         3.10 cm LV PW:         1.20 cm LV IVS:        1.20 cm  Rachelle Hora Croitoru MD Electronically signed by Thurmon Fair MD Signature  Date/Time: 10/25/2022/12:43:51 PM    Final    DG Chest Portable 1 View  Result Date: 10/25/2022 CLINICAL DATA:  Respiratory distress. EXAM: PORTABLE CHEST 1 VIEW COMPARISON:  09/11/2022 FINDINGS: Rotated film. The cardio pericardial silhouette is enlarged. Bilateral diffuse, basilar predominant airspace disease is stable to mildly progressive in the interval. Left lung base is not been included on the study. Bilateral pleural effusions suspected. Bones are diffusely demineralized. Telemetry leads overlie the chest. IMPRESSION: 1. Rotated film with bilateral diffuse, basilar predominant airspace disease, stable to mildly progressive in the interval. Imaging features likely reflect edema although diffuse infection could have this appearance. 2. Suspect bilateral pleural effusions. 3. Left lung base not included on the study. Electronically Signed   By: Kennith Center M.D.   On: 10/25/2022 07:48    Labs:  CBC: Recent Labs    11/01/22 0021 11/02/22 0016 11/03/22 0013 11/04/22 0004  WBC 12.4* 12.6* 13.5* 11.7*  HGB 7.4* 7.7* 8.0* 7.3*  HCT 24.8* 26.8* 27.6* 25.0*  PLT 363 355 322 374    COAGS: Recent Labs    09/04/22 0128 09/04/22 1133 10/26/22 1830 10/27/22 0420 10/27/22 1309 10/28/22 0500  INR 1.2  --   --   --   --   --   APTT 32   < > 42* 58* 79* 82*   < > = values in this interval not displayed.    BMP: Recent Labs    11/01/22 0021 11/02/22 0016 11/03/22 0013 11/04/22 0004  NA 136 136 136 133*  K 4.0 4.5 4.5 4.4  CL 99 99 99 96*  CO2 27 26 25 25   GLUCOSE 162* 230* 198* 195*  BUN 62* 67* 73* 74*  CALCIUM 8.8* 8.7* 8.6* 8.4*  CREATININE 2.95* 3.37* 3.89* 4.28*  GFRNONAA 15* 13* 11* 10*    LIVER FUNCTION TESTS: Recent Labs    01/20/22 1532 09/03/22 1747 09/13/22 0305 10/01/22 0900 10/07/22 0939 10/25/22 1555 10/26/22 0207 10/26/22 1610 11/01/22 0021 11/02/22 0016 11/03/22 0013 11/04/22 0004  BILITOT 0.3 0.3  --  0.4 0.2  --  0.5  --   --   --   --   --    AST 19 16  --  19 10  --  14*  --   --   --   --   --   ALT 24 14  --  15 9  --  18  --   --   --   --   --   ALKPHOS 59 64  --  61  --   --  49  --   --   --   --   --   PROT 6.3* 6.7  --  6.1* 6.4  --  6.0*  --   --   --   --   --   ALBUMIN 2.4* 2.3*   < > 2.0*  --    < > 2.2*   < > 2.3* 2.2* 2.3* 2.3*   < > = values in this interval not displayed.    TUMOR MARKERS: No results for input(s): "AFPTM", "CEA", "CA199", "CHROMGRNA" in the last 8760 hours.  Assessment and Plan:  Kayla [name] is an  82 yo female with CKD Stage IV and superimposed AKI being seen today for tunneled dialysis catheter placement. Patient has been evaluated by Nephrology team and would like to undergo routine hemodialysis. Case reviewed and approved by Dr Milford Cage. Patient has not been NPO, will proceed with single-agent sedation. Patient with WBC of 11.7, per Dr Milford Cage it is ok to proceed. Pre-procedural ancef ordered for patient.   Risks and benefits discussed with the patient including, but not limited to bleeding, infection, vascular injury, pneumothorax which may require chest tube placement, air embolism or even death  All of the patient's questions were answered, patient is agreeable to proceed. Consent signed and in chart.   Thank you for this interesting consult.  I greatly enjoyed meeting Kayla [name] and look forward to participating in their care.  A copy of this report was sent to the requesting provider on this date.  Electronically Signed: Kennieth Francois, PA-C 11/04/2022, 12:22 PM   I spent a total of 20 Minutes in face to face in clinical consultation, greater than 50% of which was counseling/coordinating care for tunneled dialysis catheter placement.

## 2022-11-05 DIAGNOSIS — J9601 Acute respiratory failure with hypoxia: Secondary | ICD-10-CM | POA: Diagnosis not present

## 2022-11-05 DIAGNOSIS — J9602 Acute respiratory failure with hypercapnia: Secondary | ICD-10-CM | POA: Diagnosis not present

## 2022-11-05 LAB — GLUCOSE, CAPILLARY
Glucose-Capillary: 170 mg/dL — ABNORMAL HIGH (ref 70–99)
Glucose-Capillary: 149 mg/dL — ABNORMAL HIGH (ref 70–99)
Glucose-Capillary: 220 mg/dL — ABNORMAL HIGH (ref 70–99)
Glucose-Capillary: 195 mg/dL — ABNORMAL HIGH (ref 70–99)

## 2022-11-05 LAB — CBC
HCT: 25.1 % — ABNORMAL LOW (ref 36.0–46.0)
Hemoglobin: 7.4 g/dL — ABNORMAL LOW (ref 12.0–15.0)
MCH: 29 pg (ref 26.0–34.0)
MCHC: 29.5 g/dL — ABNORMAL LOW (ref 30.0–36.0)
MCV: 98.4 fL (ref 80.0–100.0)
Platelets: 385 10*3/uL (ref 150–400)
RBC: 2.55 MIL/uL — ABNORMAL LOW (ref 3.87–5.11)
RDW: 19 % — ABNORMAL HIGH (ref 11.5–15.5)
WBC: 12 10*3/uL — ABNORMAL HIGH (ref 4.0–10.5)
nRBC: 0.5 % — ABNORMAL HIGH (ref 0.0–0.2)

## 2022-11-05 LAB — RENAL FUNCTION PANEL
Albumin: 2.2 g/dL — ABNORMAL LOW (ref 3.5–5.0)
Anion gap: 12 (ref 5–15)
BUN: 77 mg/dL — ABNORMAL HIGH (ref 8–23)
CO2: 25 mmol/L (ref 22–32)
Calcium: 8.4 mg/dL — ABNORMAL LOW (ref 8.9–10.3)
Chloride: 95 mmol/L — ABNORMAL LOW (ref 98–111)
Creatinine, Ser: 4.36 mg/dL — ABNORMAL HIGH (ref 0.44–1.00)
GFR, Estimated: 10 mL/min — ABNORMAL LOW (ref >60)
Glucose, Bld: 213 mg/dL — ABNORMAL HIGH (ref 70–99)
Phosphorus: 5.7 mg/dL — ABNORMAL HIGH (ref 2.5–4.6)
Potassium: 4.6 mmol/L (ref 3.5–5.1)
Sodium: 132 mmol/L — ABNORMAL LOW (ref 135–145)

## 2022-11-05 LAB — HEPATITIS B SURFACE ANTIBODY, QUANTITATIVE: Hep B S AB Quant (Post): <3.5 m[IU]/mL — ABNORMAL LOW

## 2022-11-05 LAB — MAGNESIUM: Magnesium: 2.2 mg/dL (ref 1.7–2.4)

## 2022-11-05 MED ORDER — LIDOCAINE-PRILOCAINE 2.5-2.5 % EX CREA: 1.0000 | TOPICAL_CREAM | CUTANEOUS | Status: DC | PRN

## 2022-11-05 MED ORDER — ALTEPLASE 2 MG IJ SOLR: 2.0000 mg | Freq: Once | INTRAMUSCULAR | Status: DC | PRN

## 2022-11-05 MED ORDER — PENTAFLUOROPROP-TETRAFLUOROETH EX AERO: 1.0000 | INHALATION_SPRAY | CUTANEOUS | Status: DC | PRN

## 2022-11-05 MED ORDER — HEPARIN SODIUM (PORCINE) 1000 UNIT/ML DIALYSIS
1000.0000 [IU] | INTRAMUSCULAR | Status: DC | PRN
  Administered 2022-11-05: 3200 [IU]
  Filled 2022-11-05: qty 1

## 2022-11-05 MED ORDER — ANTICOAGULANT SODIUM CITRATE 4% (200MG/5ML) IV SOLN: 5.0000 mL | Status: DC | PRN

## 2022-11-05 MED ORDER — LIDOCAINE HCL (PF) 1 % IJ SOLN: 5.0000 mL | INTRAMUSCULAR | Status: DC | PRN

## 2022-11-05 NOTE — Progress Notes (Signed)
PROGRESS NOTE  Kayla [name] ZOX:[id number] DOB: 07-08-1940 DOA: 10/25/2022 PCP: Ailene Ravel, MD  Brief History   Patient is an 82 year old Caucasian female, significantly obese, with recent history of MRSA bacteremia/infective endocarditis for which patient was on antibiotics for a long time.  Other medical history includes coronary artery disease, carotid artery disease, chronic kidney disease stage IV, COPD, diabetes mellitus type 2, hypertension, hyperlipidemia, hypothyroidism and documented congestive heart failure.  Recent echocardiograms have revealed normal left ventricular ejection fraction, without mention of diastolic dysfunction.  Patient was admitted with pulmonary edema, acute kidney injury on chronic kidney disease stage IV with oliguria versus anuria, requiring CRRT for about 48 hours.  Improvement in renal function is noted.  Patient continues to make urine.  Hypercapnic respiratory failure was also noted on presentation.  Patient has been on home oxygen for but a year.   Patient was admitted to ICU, underwent CRRT, treated for respiratory failure and pulmonary edema.  TRH assumed care on 10/30/2022.    The patient has been continued on lasix 80 mg IV bid. Doppler of renal arteries was normal. Rib x-ray discovered that aminimally displaced posterolateral left seventh rib fracture is the likely cause of the patient's left upper quadrant/left lateral chest wall pain. Over the course of the last 3 days that patient's renal function has initially improved from 3.18 to 2.95 and then worsened (from 2.95 to 3.38 to  3.89. Nephrology is aware. It has not been possible to follow the patient's volume status carefully, as nursing has not been documenting output from purewick. (Patient states that it doesn't work, and urine goes everywhere). IV fluids had been stopped on 11/02/2022. It seems that the patient's intake of fluids is very low. She has been encouraged to drink. Nephrology has changed  the patient's diuretic from laisx 80 mg IV bid to torsemide 40 mg daily which was her home dose of diuretic. Monitor carefully.   Palliative care has been consulted. They have confirmed that the patient wishes to be a DNR, but wants everything otherwise to be done to keep her alive. This would include HD. Due to creatinine continued upward course, nephrology is considering HD.  I appreciate nephrology's help.   Subjective  The patient is resting comfortably. Mentation seems more clear this morning. She is complaining of shortness of breath.  Objective   Vitals:  Vitals:   11/03/22 0802 11/03/22 1537  BP: (!) 133/44 138/89  Pulse:  (!) 43  Resp:  17  Temp:  (!) 97.2 F (36.2 C)  SpO2:  97%    Exam:  Constitutional:  The patient is awake, alert, and oriented x 3. No acute distress. Respiratory:  No increased work of breathing. No wheezes, rales, or rhonchi No tactile fremitus Cardiovascular:  Regular rate and rhythm No murmurs, ectopy, or gallups. No lateral PMI. No thrills. Abdomen:  Abdomen is soft, non-tender, non-distended No hernias, masses, or organomegaly Normoactive bowel sounds.  Musculoskeletal:  No cyanosis, clubbing, or edema Skin:  No rashes, lesions, ulcers palpation of skin: no induration or nodules Neurologic:  CN 2-12 intact Sensation all 4 extremities intact Psychiatric:  Mental status Mood, affect appropriate Orientation to person, place, time  I have personally reviewed the following:   Today's Data   Vitals:   11/03/22 0802 11/03/22 1537  BP: (!) 133/44 138/89  Pulse:  (!) 43  Resp:  17  Temp:  (!) 97.2 F (36.2 C)  SpO2:  97%     Lab Data  Latest Ref Rng & Units 11/03/2022   12:13 AM 11/02/2022   12:16 AM 11/01/2022   12:21 AM  CBC  WBC 4.0 - 10.5 K/uL 13.5  12.6  12.4   Hemoglobin 12.0 - 15.0 g/dL 8.0  7.7  7.4   Hematocrit 36.0 - 46.0 % 27.6  26.8  24.8   Platelets 150 - 400 K/uL 322  355  363       Latest Ref Rng &  Units 11/05/2022   12:02 AM 11/04/2022   12:04 AM 11/03/2022   12:13 AM  BMP  Glucose 70 - 99 mg/dL 098  119  147   BUN 8 - 23 mg/dL 77  74  73   Creatinine 0.44 - 1.00 mg/dL 8.29  5.62  1.30   Sodium 135 - 145 mmol/L 132  133  136   Potassium 3.5 - 5.1 mmol/L 4.6  4.4  4.5   Chloride 98 - 111 mmol/L 95  96  99   CO2 22 - 32 mmol/L 25  25  25    Calcium 8.9 - 10.3 mg/dL 8.4  8.4  8.6     Micro Data   Results for orders placed or performed during the hospital encounter of 10/25/22  Culture, blood (routine x 2)     Status: None   Collection Time: 10/25/22  8:24 AM   Specimen: BLOOD LEFT ARM  Result Value Ref Range Status   Specimen Description BLOOD LEFT ARM  Final   Special Requests   Final    BOTTLES DRAWN AEROBIC AND ANAEROBIC Blood Culture adequate volume   Culture   Final    NO GROWTH 5 DAYS Performed at [facility], [address], [place], [place] [address]    Report Status 10/30/2022 FINAL  Final  Culture, blood (routine x 2)     Status: None   Collection Time: 10/25/22  8:26 AM   Specimen: BLOOD  Result Value Ref Range Status   Specimen Description BLOOD SITE NOT SPECIFIED  Final   Special Requests   Final    BOTTLES DRAWN AEROBIC AND ANAEROBIC Blood Culture results may not be optimal due to an inadequate volume of blood received in culture bottles   Culture   Final    NO GROWTH 5 DAYS Performed at [facility], [address] [place], [place] [address]    Report Status 10/30/2022 FINAL  Final  MRSA Next Gen by PCR, Nasal     Status: None   Collection Time: 10/25/22 11:46 AM   Specimen: Nasal Mucosa; Nasal Swab  Result Value Ref Range Status   MRSA by PCR Next Gen NOT DETECTED NOT DETECTED Final    Comment: (NOTE) The GeneXpert MRSA Assay (FDA approved for NASAL specimens only), is one component of a comprehensive MRSA colonization surveillance program. It is not intended to diagnose MRSA infection nor to guide or monitor treatment for MRSA  infections. Test performance is not FDA approved in patients less than 39 years old. Performed at [facility], [address] [place], [place] [address]     Scheduled Meds:  amLODipine  10 mg Oral Daily   apixaban  2.5 mg Oral BID   atorvastatin  40 mg Oral Daily   bisacodyl  10 mg Rectal Daily   Or   bisacodyl  10 mg Oral Daily   darbepoetin (ARANESP) injection - NON-DIALYSIS  60 mcg Subcutaneous Q Wed-1800   [START ON 11/04/2022] feeding supplement (NEPRO CARB  STEADY)  237 mL Oral BID BM   guaiFENesin  600 mg Oral BID   insulin aspart  0-6 Units Subcutaneous TID WC   isosorbide mononitrate  30 mg Oral Daily   levothyroxine  200 mcg Oral Q0600   lidocaine  1 patch Transdermal Q24H   multivitamin  1 tablet Oral QHS   mouth rinse  15 mL Mouth Rinse 4 times per day   sevelamer carbonate  800 mg Oral TID WC   sodium chloride flush  3 mL Intravenous Q12H   torsemide  40 mg Oral Daily   Continuous Infusions:  sodium chloride Stopped (10/26/22 1934)    Principal Problem:   Acute respiratory failure with hypoxia and hypercapnia (HCC) Active Problems:   AKI (acute kidney injury) (HCC)   Junctional bradycardia   Hyperkalemia   LOS: 9 days    A & P  Assessment & Plan:   Principal Problem:   Acute respiratory failure with hypoxia and hypercapnia (HCC) Active Problems:   AKI (acute kidney injury) (HCC)   Junctional bradycardia   Hyperkalemia   Acute kidney injury on chronic kidney disease stage IV/pulmonary edema: -AKI is worsening. -Serum creatinine has increased to 4.28. It has been increasing for the last 3 days. -Nephrology has changed diuresis from lasix 80 mg bid to torsemide 40 mg daily, but creatinine continues to rise. -Patient was on CRRT for about 48 hours. She states that she would agree to HD. Nephrology is now considering it. -The patient has had poor PO intake of fluids. She has been encouraged to drink, but she states that she doesn't like to drink,  because she fears wetting the bed.  -Strict I's and O's. -Continue to monitor input and input. -Avoid nephrotoxins. -Dose all medications assuming GFR of less than 10 mL/min. -Keep MAP greater than 65 mmHg.    Acute on chronic hypoxic/hypercapnic respiratory failure: -Patient has been on oxygen for about 1 year (2 L/min via nasal cannula). -Etiology is likely multifactorial. -Pt complains of increasing work of breathing. Likely due to worsening renal statu -Discussed need to comply with CPAP management and, treatment of OSA/likely undiagnosed OHS. 11/01/2022: Stable.   COPD: -Seems compensated. -Continue current regimen.   Left lateral chest wall pain: -X-ray of the ribs revealed minimally displaced posterolateral left seventh rib fracture.  -Adequate pain control. -Lidocaine patch to left lateral rib cage area. -Supportive management.   Paroxysmal atrial fibrillation: -Controlled heart rate. -Patient is on apixaban 2.5 Mg p.o. twice daily.   Anemia of chronic kidney disease: -Patient has received IV iron. -Patient is on Aranesp every 2 weeks.   Hypertension: -Continue to optimize. -Goal blood pressure should be less than 130/80 mmHg.   Diabetes mellitus:  -Continue sliding scale insulin coverage. -Continue to monitor blood sugar and adjust accordingly.   Hyperlipidemia: -Continue atorvastatin.   Possible community acquired pneumonia: -Findings/events may be suggestive of pulmonary edema. -IV Zosyn has been discontinued.     DVT prophylaxis: Apixaban. Code Status: DO NOT RESUSCITATE. Family Communication: Husband. Disposition Plan: This will depend on hospital course.     Consultants:  Nephrology.   Procedures:  Patient was on CRRT.   Antimicrobials:  IV Zosyn has been discontinued.    Kayla Vidas, DO [facility] Direct contact: see [email or web address]  7PM-7AM contact night coverage as above 11/04/2022, 5:43 PM  LOS: 9 days

## 2022-11-05 NOTE — Progress Notes (Signed)
Mobility Specialist Progress Note:   11/05/22 1400  Oxygen Therapy  O2 Device Nasal Cannula  O2 Flow Rate (L/min) 4 L/min  Mobility  Activity Ambulated with assistance in hallway  Level of Assistance +2 (takes two people)  Press photographer wheel walker  Distance Ambulated (ft) 30 ft  Activity Response Tolerated fair  Mobility Referral Yes  $Mobility charge 1 Mobility  Mobility Specialist Start Time (ACUTE ONLY) 1350  Mobility Specialist Stop Time (ACUTE ONLY) 1410  Mobility Specialist Time Calculation (min) (ACUTE ONLY) 20 min    Pre Mobility: 48 HR, 98% SpO2 5L During Mobility: 59 HR,  95% SpO2 4L Post Mobility:  49 HR,  146/45 BP,  98% SpO2 5L  Pt received in chair on 5L O2, agreeable to mobility. MinA for sit to stand. Ambulated in hallway with on RW, CG, and chair follow. Ambulated on 4L O2 w/ SpO2 levels in the 90s.  VSS throughout. Pt c/o SOB and LUE pain during session. Unable to ambulate back to room, so pt was wheeled back in chair. Pt left in chair with call bell and family present.  D'Vante Earlene Plater Mobility Specialist Please contact via Special educational needs teacher or Rehab office at [phone]

## 2022-11-05 NOTE — Progress Notes (Signed)
Back from HD unit by bed awake and alert.

## 2022-11-05 NOTE — Progress Notes (Signed)
Received patient in bed to unit.  Alert and oriented.  Informed consent signed and in chart.   TX duration:2.5  Patient tolerated well.  Transported back to the room  Alert, without acute distress.  Hand-off given to patient's nurse.   Access used: right [facility] Access issues: NONE  Total UF removed: 1L Medication(s) given: NONE   11/05/22 1144  Vitals  Temp 97.7 F (36.5 C)  Temp Source Oral  BP (!) 145/44  MAP (mmHg) 74  BP Location Left Arm  BP Method Automatic  Patient Position (if appropriate) Lying  Pulse Rate (!) 48  Pulse Rate Source Monitor  ECG Heart Rate (!) 47  Resp 14  Oxygen Therapy  SpO2 100 %  O2 Device Nasal Cannula  O2 Flow Rate (L/min) 4 L/min  During Treatment Monitoring  HD Safety Checks Performed Yes  Intra-Hemodialysis Comments Tx completed;Tolerated well  Dialysis Fluid Bolus Normal Saline  Bolus Amount (mL) 300 mL    Shaindy Reader [facility]

## 2022-11-05 NOTE — Progress Notes (Signed)
Transported to hemodialysis unit by bed awake and alert.

## 2022-11-05 NOTE — Progress Notes (Signed)
Complained of shortness of breath, pulse ox-92% on oxygen 3 Kayla [name]. Albuterol breathing treatment given with relief.

## 2022-11-05 NOTE — Progress Notes (Signed)
Physical Therapy Treatment Patient Details Name: Kayla [name] MRN: [id number] DOB: Sep 05, 1940 Today's Date: 11/05/2022   History of Present Illness Pt is 82 year old female brought to the ED 10/25/22 for SoB and AMS. Found to be in respiratory distress, HR 30s. ABG pH 7.1 with high pCO2 and low bicarbonate, Alo noted to have AKI. 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 Pt denied progressing ambulation in hallway; agreeable to ambulation in room before returning to bed. Pt able to stand from recliner without physical assistance from PT; min guard for safety. Ambulated around hospital bed with RW before return to supine. Required assistance for LE management into bed. Pt reported dizziness upon supine; no nystagmus present. Will continue to monitor acute stay. Anticipating patient to benefit from skilled rehab < 3 hours a day in order to promote independence and improve functional mobility.      Assistance Recommended at Discharge Frequent or constant Supervision/Assistance  If plan is discharge home, recommend the following:  Can travel by private vehicle    A little help with walking and/or transfers;A little help with bathing/dressing/bathroom;Assistance with cooking/housework;Direct supervision/assist for medications management;Direct supervision/assist for financial management;Assist for transportation;Help with stairs or ramp for entrance   No  Equipment Recommendations  Rolling walker (2 wheels);Wheelchair cushion (measurements PT);Wheelchair (measurements PT)    Recommendations for Other Services        Precautions / Restrictions Precautions Precautions: Fall Restrictions Weight Bearing Restrictions: No     Mobility  Bed Mobility Overal bed mobility: Needs Assistance Bed Mobility: Sit to Supine       Sit to supine: Min assist   General bed mobility comments: Received sitting upright in recliner; transitioned to bed with minA for LE management    Transfers Overall transfer level: Needs assistance Equipment used: Rolling walker (2 wheels) Transfers: Sit to/from Stand Sit to Stand: Min guard           General transfer comment: Able to transfer from recliner without physical assistance; PT provided minG assist for safety    Ambulation/Gait Ambulation/Gait assistance: Min guard Gait Distance (Feet): 15 Feet Assistive device: Rolling walker (2 wheels) Gait Pattern/deviations: Step-to pattern, Decreased stride length, Trunk flexed Gait velocity: Decreased Gait velocity interpretation: <1.8 ft/sec, indicate of risk for recurrent falls   General Gait Details: Presented with decreased velocity and step to pattern, patient demonstrated good RW proximity in this session.   Stairs             Wheelchair Mobility     Tilt Bed    Modified Rankin (Stroke Patients Only)       Balance Overall balance assessment: Needs assistance Sitting-balance support: Feet supported, No upper extremity supported Sitting balance-Leahy Scale: Fair Sitting balance - Comments: Able to sit edge of recliner without UE support   Standing balance support: Bilateral upper extremity supported, During functional activity, Reliant on assistive device for balance Standing balance-Leahy Scale: Poor Standing balance comment: Reliant on RW for UE support during functional mobility                            Cognition Arousal/Alertness: Awake/alert Behavior During Therapy: [facility] for tasks  assessed/performed Overall Cognitive Status: Impaired/Different from baseline Area of  Impairment: Attention, Following commands, Safety/judgement, Awareness                   Current Attention Level: Focused Memory: Decreased short-term memory Following Commands: Follows one step commands consistently, Follows one step commands with increased time Safety/Judgement: Decreased awareness of safety, Decreased awareness of deficits   Problem Solving: Slow processing, Difficulty sequencing, Requires verbal cues General Comments: Increased time for processing one step commands.        Exercises      General Comments        Pertinent Vitals/Pain Pain Assessment Pain Assessment: Faces Faces Pain Scale: Hurts even more Pain Location: Ribs Pain Descriptors / Indicators: Discomfort, Grimacing, Guarding Pain Intervention(s): Limited activity within patient's tolerance, Monitored during session    Home Living                          Prior Function            PT Goals (current goals can now be found in the care plan section) Acute Rehab PT Goals Patient Stated Goal: Patient would like to return home with less pain and dizziness PT Goal Formulation: With patient/family Time For Goal Achievement: 11/13/22 Potential to Achieve Goals: Fair Progress towards PT goals: Progressing toward goals    Frequency    Min 1X/week      PT Plan Current plan remains appropriate    Co-evaluation              AM-PAC PT "6 Clicks" Mobility   Outcome Measure  Help needed turning from your back to your side while in a flat bed without using bedrails?: A Little Help needed moving from lying on your back to sitting on the side of a flat bed without using bedrails?: A Little Help needed moving to and from a bed to a chair (including a wheelchair)?: A Lot Help needed standing up from a chair using your arms (e.g., wheelchair or bedside chair)?: A Little Help needed to walk in hospital room?: A Little Help needed climbing 3-5 steps with a railing? : A  Little 6 Click Score: 17    End of Session Equipment Utilized During Treatment: Gait belt Activity Tolerance: Patient limited by fatigue Patient left: in bed;with call bell/phone within reach;with bed alarm set;with family/visitor present Nurse Communication: Mobility status PT Visit Diagnosis: Other abnormalities of gait and mobility (R26.89);Repeated falls (R29.6);History of falling (Z91.81);Muscle weakness (generalized) (M62.81);Dizziness and giddiness (R42) Pain - part of body:  (Ribs)     Time: 4098-1191 PT Time Calculation (min) (ACUTE ONLY): 12 min  Charges:    $Therapeutic Activity: 8-22 mins PT General Charges $$ ACUTE PT VISIT: 1 Visit                     Christene Lye, SPT Acute Rehabilitation Services [phone] Secure chat preferred     Christene Lye 11/05/2022, 3:35 PM

## 2022-11-05 NOTE — Progress Notes (Signed)
PROGRESS NOTE  Kayla [name] NWG:[id number] DOB: [birth year] DOA: 10/25/2022 PCP: Ailene Ravel, MD  Brief History   Patient is an 82 year old Caucasian female, significantly obese, with recent history of MRSA bacteremia/infective endocarditis for which patient was on antibiotics for a long time.  Other medical history includes coronary artery disease, carotid artery disease, chronic kidney disease stage IV, COPD, diabetes mellitus type 2, hypertension, hyperlipidemia, hypothyroidism and documented congestive heart failure.  Recent echocardiograms have revealed normal left ventricular ejection fraction, without mention of diastolic dysfunction.  Patient was admitted with pulmonary edema, acute kidney injury on chronic kidney disease stage IV with oliguria versus anuria, requiring CRRT for about 48 hours.  Improvement in renal function is noted.  Patient continues to make urine.  Hypercapnic respiratory failure was also noted on presentation.  Patient has been on home oxygen for but a year.   Patient was admitted to ICU, underwent CRRT, treated for respiratory failure and pulmonary edema.  TRH assumed care on 10/30/2022.    The patient has been continued on lasix 80 mg IV bid. Doppler of renal arteries was normal. Rib x-ray discovered that aminimally displaced posterolateral left seventh rib fracture is the likely cause of the patient's left upper quadrant/left lateral chest wall pain. Over the course of the last 3 days that patient's renal function has initially improved from 3.18 to 2.95 and then worsened (from 2.95 to 3.38 to  3.89. Nephrology is aware. It has not been possible to follow the patient's volume status carefully, as nursing has not been documenting output from purewick. (Patient states that it doesn't work, and urine goes everywhere). IV fluids had been stopped on 11/02/2022. It seems that the patient's intake of fluids is very low. She has been encouraged to drink. Nephrology has changed  the patient's diuretic from laisx 80 mg IV bid to torsemide 40 mg daily which was her home dose of diuretic. Monitor carefully.   Palliative care has been consulted. They have confirmed that the patient wishes to be a DNR, but wants everything otherwise to be done to keep her alive. This would include HD. Due to creatinine continued upward course, nephrology is considering HD.  I appreciate nephrology's help.   Subjective  -Patient seen alongside patient's husband.  Patient has undergone hemodialysis today.  Nephrology input is appreciated.  No shortness of breath.  No chest pain.  Patient has right chest TDC. Objective   Vitals:  Vitals:   11/05/22 1703 11/05/22 1932  BP: (!) 156/54 (!) 155/41  Pulse: (!) 56 (!) 54  Resp: 14 17  Temp: 97.9 F (36.6 C) 97.6 F (36.4 C)  SpO2:  96%    Exam:  Constitutional:  The patient is awake, alert, and oriented x 3. No acute distress. Respiratory:  Decreased air entry globally. Cardiovascular:  S1-S2.   Abdomen:  Abdomen is obese, soft and nontender.   Musculoskeletal:  Bilateral lower extremity edema.  Neurologic:  Awake and alert.  Moves all extremities.  I have personally reviewed the following:   Today's Data   Vitals:   11/05/22 1703 11/05/22 1932  BP: (!) 156/54 (!) 155/41  Pulse: (!) 56 (!) 54  Resp: 14 17  Temp: 97.9 F (36.6 C) 97.6 F (36.4 C)  SpO2:  96%     Lab Data      Latest Ref Rng & Units 11/05/2022   12:02 AM 11/04/2022   12:04 AM 11/03/2022   12:13 AM  CBC  WBC 4.0 - 10.5 K/uL  12.0  11.7  13.5   Hemoglobin 12.0 - 15.0 g/dL 7.4  7.3  8.0   Hematocrit 36.0 - 46.0 % 25.1  25.0  27.6   Platelets 150 - 400 K/uL 385  374  322       Latest Ref Rng & Units 11/05/2022   12:02 AM 11/04/2022   12:04 AM 11/03/2022   12:13 AM  BMP  Glucose 70 - 99 mg/dL 161  096  045   BUN 8 - 23 mg/dL 77  74  73   Creatinine 0.44 - 1.00 mg/dL 4.09  8.11  9.14   Sodium 135 - 145 mmol/L 132  133  136   Potassium 3.5 - 5.1  mmol/L 4.6  4.4  4.5   Chloride 98 - 111 mmol/L 95  96  99   CO2 22 - 32 mmol/L 25  25  25    Calcium 8.9 - 10.3 mg/dL 8.4  8.4  8.6     Micro Data   Results for orders placed or performed during the hospital encounter of 10/25/22  Culture, blood (routine x 2)     Status: None   Collection Time: 10/25/22  8:24 AM   Specimen: BLOOD LEFT ARM  Result Value Ref Range Status   Specimen Description BLOOD LEFT ARM  Final   Special Requests   Final    BOTTLES DRAWN AEROBIC AND ANAEROBIC Blood Culture adequate volume   Culture   Final    NO GROWTH 5 DAYS Performed at [facility], [address] [place], [place] [address]    Report Status 10/30/2022 FINAL  Final  Culture, blood (routine x 2)     Status: None   Collection Time: 10/25/22  8:26 AM   Specimen: BLOOD  Result Value Ref Range Status   Specimen Description BLOOD SITE NOT SPECIFIED  Final   Special Requests   Final    BOTTLES DRAWN AEROBIC AND ANAEROBIC Blood Culture results may not be optimal due to an inadequate volume of blood received in culture bottles   Culture   Final    NO GROWTH 5 DAYS Performed at [facility], [address], [place], [place] [address]    Report Status 10/30/2022 FINAL  Final  MRSA Next Gen by PCR, Nasal     Status: None   Collection Time: 10/25/22 11:46 AM   Specimen: Nasal Mucosa; Nasal Swab  Result Value Ref Range Status   MRSA by PCR Next Gen NOT DETECTED NOT DETECTED Final    Comment: (NOTE) The GeneXpert MRSA Assay (FDA approved for NASAL specimens only), is one component of a comprehensive MRSA colonization surveillance program. It is not intended to diagnose MRSA infection nor to guide or monitor treatment for MRSA infections. Test performance is not FDA approved in patients less than 73 years old. Performed at [facility], [address], [place], [place] [address]     Scheduled Meds:  amLODipine  10 mg Oral Daily   apixaban  2.5 mg Oral BID   atorvastatin  40  mg Oral Daily   bisacodyl  10 mg Rectal Daily   Or   bisacodyl  10 mg Oral Daily   Chlorhexidine Gluconate Cloth  6 each Topical Daily   Chlorhexidine Gluconate Cloth  6 each Topical Q0600   darbepoetin (ARANESP) injection - NON-DIALYSIS  100 mcg Subcutaneous Q Wed-1800   feeding supplement (NEPRO CARB STEADY)  237 mL Oral BID BM   guaiFENesin  600 mg Oral BID   insulin aspart  0-6 Units Subcutaneous TID WC   isosorbide mononitrate  30 mg Oral Daily   levothyroxine  200 mcg Oral Q0600   lidocaine  1 patch Transdermal Q24H   multivitamin  1 tablet Oral QHS   mouth rinse  15 mL Mouth Rinse 4 times per day   sevelamer carbonate  800 mg Oral TID WC   sodium chloride flush  3 mL Intravenous Q12H   torsemide  40 mg Oral Daily   Continuous Infusions:  sodium chloride Stopped (10/26/22 1934)    Principal Problem:   Acute respiratory failure with hypoxia and hypercapnia (HCC) Active Problems:   AKI (acute kidney injury) (HCC)   Junctional bradycardia   Hyperkalemia   LOS: 11 days    A & P  Assessment & Plan:   Principal Problem:   Acute respiratory failure with hypoxia and hypercapnia (HCC) Active Problems:   AKI (acute kidney injury) (HCC)   Junctional bradycardia   Hyperkalemia   Acute kidney injury on chronic kidney disease stage IV/pulmonary edema: -AKI is worsening. -Serum creatinine has increased to 4.28. It has been increasing for the last 3 days. -Nephrology has changed diuresis from lasix 80 mg bid to torsemide 40 mg daily, but creatinine continues to rise. -Patient was on CRRT for about 48 hours. She states that she would agree to HD. Nephrology is now considering it. -The patient has had poor PO intake of fluids. She has been encouraged to drink, but she states that she doesn't like to drink, because she fears wetting the bed.  -Strict I's and O's. -Continue to monitor input and input. -Avoid nephrotoxins. -Dose all medications assuming GFR of less than 10  mL/min. -Keep MAP greater than 65 mmHg. 11/05/2022: Patient had hemodialysis today.  Nephrology input is appreciated.   Acute on chronic hypoxic/hypercapnic respiratory failure: -Patient has been on oxygen for about 1 year (2 L/min via nasal cannula). -Etiology is likely multifactorial. -Pt complains of increasing work of breathing. Likely due to worsening renal statu -Discussed need to comply with CPAP management and, treatment of OSA/likely undiagnosed OHS. 11/05/2022: Stable.   COPD: -Seems compensated. -Continue current regimen.   Left lateral chest wall pain: -X-ray of the ribs revealed minimally displaced posterolateral left seventh rib fracture.  -Adequate pain control. -Lidocaine patch to left lateral rib cage area. -Supportive management.   Paroxysmal atrial fibrillation: -Controlled heart rate. -Patient is on apixaban 2.5 Mg p.o. twice daily.   Anemia of chronic kidney disease: -Patient has received IV iron. -Patient is on Aranesp every 2 weeks.   Hypertension: -Continue to optimize. -Goal blood pressure should be less than 130/80 mmHg.   Diabetes mellitus:  -Continue sliding scale insulin coverage. -Continue to monitor blood sugar and adjust accordingly.   Hyperlipidemia: -Continue atorvastatin.   Possible community acquired pneumonia: -Findings/events may be suggestive of pulmonary edema. -IV Zosyn has been discontinued.     DVT prophylaxis: Apixaban. Code Status: DO NOT RESUSCITATE. Family Communication: Husband. Disposition Plan: This will depend on hospital course.     Consultants:  Nephrology.   Procedures:  Patient was on CRRT.   Antimicrobials:  IV Zosyn has been discontinued.    Berton Mount, MD. [facility] Direct contact: see [email or web address]  7PM-7AM contact night coverage as above 11/04/2022, 5:43 PM  LOS: 9 days

## 2022-11-05 NOTE — Progress Notes (Signed)
Complained of shortness of breath, no signs of distress, asked for breathing  treatment. Albuterol breathing  treatment given with relief.

## 2022-11-05 NOTE — Progress Notes (Signed)
Patient ID: Kayla [name], female   DOB: 12-24-1940, 82 y.o.   MRN: [id number] S: No new complaints this am. O:BP (!) 139/45 (BP Location: Left Arm)   Pulse (!) 49   Temp (!) 97.4 F (36.3 C) (Oral)   Resp 16   Ht 5\' 5"  (1.651 m)   Wt 103.6 kg   SpO2 99%   BMI 38.01 kg/m   Intake/Output Summary (Last 24 hours) at 11/05/2022 0832 Last data filed at 11/05/2022 0744 Gross per 24 hour  Intake 120 ml  Output 300 ml  Net -180 ml   Intake/Output: I/O last 3 completed shifts: In: -  Out: 450 [Urine:450]  Intake/Output this shift:  Total I/O In: 120 [P.O.:120] Out: -  Weight change:  Gen: NAD CVS: bradycardic at 49 Resp:CTA Abd:+BS, soft, NT/ND Ext: 1+ pretibial edema bilaterally  Recent Labs  Lab 10/30/22 0017 10/31/22 0016 11/01/22 0021 11/02/22 0016 11/03/22 0013 11/04/22 0004 11/05/22 0002  NA 137 137 136 136 136 133* 132*  K 4.3 4.2 4.0 4.5 4.5 4.4 4.6  CL 96* 99 99 99 99 96* 95*  CO2 26 25 27 26 25 25 25   GLUCOSE 176* 163* 162* 230* 198* 195* 213*  BUN 67* 67* 62* 67* 73* 74* 77*  CREATININE 3.34* 3.18* 2.95* 3.37* 3.89* 4.28* 4.36*  ALBUMIN 2.0* 2.4* 2.3* 2.2* 2.3* 2.3* 2.2*  CALCIUM 8.5* 8.7* 8.8* 8.7* 8.6* 8.4* 8.4*  PHOS 6.3* 5.7* 5.4* 6.6* 6.4* 6.2* 5.7*   Liver Function Tests: Recent Labs  Lab 11/03/22 0013 11/04/22 0004 11/05/22 0002  ALBUMIN 2.3* 2.3* 2.2*   No results for input(s): "LIPASE", "AMYLASE" in the last 168 hours. No results for input(s): "AMMONIA" in the last 168 hours. CBC: Recent Labs  Lab 11/01/22 0021 11/02/22 0016 11/03/22 0013 11/04/22 0004 11/05/22 0002  WBC 12.4* 12.6* 13.5* 11.7* 12.0*  HGB 7.4* 7.7* 8.0* 7.3* 7.4*  HCT 24.8* 26.8* 27.6* 25.0* 25.1*  MCV 96.9 100.0 99.6 100.8* 98.4  PLT 363 355 322 374 385   Cardiac Enzymes: No results for input(s): "CKTOTAL", "CKMB", "CKMBINDEX", "TROPONINI" in the last 168 hours. CBG: Recent Labs  Lab 11/04/22 0702 11/04/22 1049 11/04/22 1559 11/04/22 2119 11/05/22 0607   GLUCAP 160* 193* 211* 232* 170*    Iron Studies: No results for input(s): "IRON", "TIBC", "TRANSFERRIN", "FERRITIN" in the last 72 hours. Studies/Results: IR Fluoro Guide CV Line Right  Result Date: 11/04/2022 INDICATION: 82 year old female in need of hemodialysis. She recently dislodged her right IJ tunneled hemodialysis catheter and requires placement of a new device. EXAM: TUNNELED CENTRAL VENOUS HEMODIALYSIS CATHETER PLACEMENT WITH ULTRASOUND AND FLUOROSCOPIC GUIDANCE MEDICATIONS: No additional antibiotics administered. ANESTHESIA/SEDATION: 1 mg Versed administered for anxiolysis. FLUOROSCOPY TIME:  Radiation exposure index: 3 mGy reference air kerma COMPLICATIONS: None immediate. PROCEDURE: Informed written consent was obtained from the patient after a discussion of the risks, benefits, and alternatives to treatment. Questions regarding the procedure were encouraged and answered. The right neck and chest were prepped with chlorhexidine in a sterile fashion, and a sterile drape was applied covering the operative field. Maximum barrier sterile technique with sterile gowns and gloves were used for the procedure. A timeout was performed prior to the initiation of the procedure. After creating a small venotomy incision, a micropuncture kit was utilized to access the right internal jugular vein under direct, real-time ultrasound guidance after the overlying soft tissues were anesthetized with 1% lidocaine with epinephrine. Ultrasound image documentation was performed. The microwire was kinked to measure appropriate  catheter length. A stiff Glidewire was advanced to the level of the IVC and the micropuncture sheath was exchanged for a peel-away sheath. A palindrome tunneled hemodialysis catheter measuring 19 cm from tip to cuff was tunneled in a retrograde fashion from the anterior chest wall to the venotomy incision. The catheter was then placed through the peel-away sheath with tips ultimately positioned  within the superior aspect of the right atrium. Final catheter positioning was confirmed and documented with a spot radiographic image. The catheter aspirates and flushes normally. The catheter was flushed with appropriate volume heparin dwells. The catheter exit site was secured with a 0-Prolene retention suture. The venotomy incision was closed with Dermabond. Dressings were applied. The patient tolerated the procedure well without immediate post procedural complication. IMPRESSION: Successful placement of 19 cm tip to cuff tunneled hemodialysis catheter via the right internal jugular vein with tips terminating within the superior aspect of the right atrium. The catheter is ready for immediate use. Electronically Signed   By: Malachy Moan M.D.   On: 11/04/2022 15:08    amLODipine  10 mg Oral Daily   apixaban  2.5 mg Oral BID   atorvastatin  40 mg Oral Daily   bisacodyl  10 mg Rectal Daily   Or   bisacodyl  10 mg Oral Daily   Chlorhexidine Gluconate Cloth  6 each Topical Daily   Chlorhexidine Gluconate Cloth  6 each Topical Q0600   darbepoetin (ARANESP) injection - NON-DIALYSIS  100 mcg Subcutaneous Q Wed-1800   feeding supplement (NEPRO CARB STEADY)  237 mL Oral BID BM   guaiFENesin  600 mg Oral BID   insulin aspart  0-6 Units Subcutaneous TID WC   isosorbide mononitrate  30 mg Oral Daily   levothyroxine  200 mcg Oral Q0600   lidocaine  1 patch Transdermal Q24H   multivitamin  1 tablet Oral QHS   mouth rinse  15 mL Mouth Rinse 4 times per day   sevelamer carbonate  800 mg Oral TID WC   sodium chloride flush  3 mL Intravenous Q12H   torsemide  40 mg Oral Daily    BMET    Component Value Date/Time   NA 132 (L) 11/05/2022 0002   NA 140 02/16/2022 1008   K 4.6 11/05/2022 0002   CL 95 (L) 11/05/2022 0002   CO2 25 11/05/2022 0002   GLUCOSE 213 (H) 11/05/2022 0002   BUN 77 (H) 11/05/2022 0002   BUN 47 (H) 02/16/2022 1008   CREATININE 4.36 (H) 11/05/2022 0002   CREATININE 2.10 (H)  10/07/2022 0939   CALCIUM 8.4 (L) 11/05/2022 0002   CALCIUM 9.9 06/04/2008 2240   GFRNONAA 10 (L) 11/05/2022 0002   GFRAA 45 (L) 02/16/2020 1124   CBC    Component Value Date/Time   WBC 12.0 (H) 11/05/2022 0002   RBC 2.55 (L) 11/05/2022 0002   HGB 7.4 (L) 11/05/2022 0002   HGB 11.3 01/30/2021 1154   HCT 25.1 (L) 11/05/2022 0002   HCT 35.9 01/30/2021 1154   PLT 385 11/05/2022 0002   PLT 389 01/30/2021 1154   MCV 98.4 11/05/2022 0002   MCV 91 01/30/2021 1154   MCH 29.0 11/05/2022 0002   MCHC 29.5 (L) 11/05/2022 0002   RDW 19.0 (H) 11/05/2022 0002   RDW 13.4 01/30/2021 1154   LYMPHSABS 1.4 10/25/2022 0730   MONOABS 1.3 (H) 10/25/2022 0730   EOSABS 0.1 10/25/2022 0730   BASOSABS 0.0 10/25/2022 0730    Assessment/Plan:   Acute kidney  injury on CKD stage IV likely due to CHF exacerbation/fluid overload/bradycardia leading to reduced renal perfusion.  B/L Cr around 2, followed by Dr. Noah Charon as an outpatient. She had hyperkalemia, anasarca and did not respond with IV diuretics.  Also had features of uremic encephalopathy including tremor, AMS. -temp HD catheter placed on 6/23, started CRRT 6/23. CRRT off on 6/24 given access issues/alarms. RIJ temp line removed 6/26. Uremic symptoms improved at that time   -Cr down to 2.95 but rose to 3.83 and up to 4.28 yesterday and 4.36 this morning.  Resumed her outpatient torsemide dose of 40 mg daily but worsening UOP and BUN/Cr.   - s/p TDC placement by IR 11/04/22 and plan to resume HD today as this may be ESRD at this point.  -She is interested in home therapies down the road if/when indicated.  She is a poor longterm HD candidate given her advanced age, multiple co-morbidities, and poor functional and nutritional status.   -normal renal artery duplex today, no evidence of RAS -daily labs, strict I/O -hold off on discharge for now as she may need to have outpatient HD arrangements made prior.    Hyperkalemia: Received medical treatment in  the ER.  Improved with CRRT (currently off). On lasix + renal diet. K WNL now   Bradycardia: Seen by cardiology during admit. H/o PPM extraction secondary to bacteremia (Sep 07, 2022).  Off dopamine gtt, HR stable. Avoid beta blockers   Acute on chronic respiratory failure/respiratory distress with hypoxia: lasix as above. CXR 6/25 revealed improvement in opacities. On 3L [place] at baseline @ home -improved.   Sepsis due to possible pneumonia: On antibiotics per primary team.   Hyponatremia, mild: likely secondary to AKI and hypervolemia. WNL now   Anemia: transfuse for Hgb <7. ESA started 6/26, will increase dose of aranesp to 100 mcg. 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  Will start binders, c/w renal diet  Irena Cords, MD [facility]

## 2022-11-06 DIAGNOSIS — J9602 Acute respiratory failure with hypercapnia: Secondary | ICD-10-CM | POA: Diagnosis not present

## 2022-11-06 DIAGNOSIS — J9601 Acute respiratory failure with hypoxia: Secondary | ICD-10-CM | POA: Diagnosis not present

## 2022-11-06 LAB — RENAL FUNCTION PANEL
Albumin: 2.5 g/dL — ABNORMAL LOW (ref 3.5–5.0)
Anion gap: 12 (ref 5–15)
BUN: 51 mg/dL — ABNORMAL HIGH (ref 8–23)
CO2: 25 mmol/L (ref 22–32)
Calcium: 8.4 mg/dL — ABNORMAL LOW (ref 8.9–10.3)
Chloride: 95 mmol/L — ABNORMAL LOW (ref 98–111)
Creatinine, Ser: 3.07 mg/dL — ABNORMAL HIGH (ref 0.44–1.00)
GFR, Estimated: 15 mL/min — ABNORMAL LOW (ref >60)
Glucose, Bld: 192 mg/dL — ABNORMAL HIGH (ref 70–99)
Phosphorus: 4.1 mg/dL (ref 2.5–4.6)
Potassium: 4.1 mmol/L (ref 3.5–5.1)
Sodium: 132 mmol/L — ABNORMAL LOW (ref 135–145)

## 2022-11-06 LAB — GLUCOSE, CAPILLARY
Glucose-Capillary: 156 mg/dL — ABNORMAL HIGH (ref 70–99)
Glucose-Capillary: 167 mg/dL — ABNORMAL HIGH (ref 70–99)
Glucose-Capillary: 250 mg/dL — ABNORMAL HIGH (ref 70–99)
Glucose-Capillary: 237 mg/dL — ABNORMAL HIGH (ref 70–99)

## 2022-11-06 LAB — MAGNESIUM: Magnesium: 1.9 mg/dL (ref 1.7–2.4)

## 2022-11-06 MED ORDER — FUROSEMIDE 10 MG/ML IJ SOLN
120.0000 mg | Freq: Two times a day (BID) | INTRAVENOUS | Status: DC
  Administered 2022-11-06 – 2022-11-08 (×5): 120 mg via INTRAVENOUS
  Filled 2022-11-06 (×3): qty 10
  Filled 2022-11-06: qty 12
  Filled 2022-11-06: qty 2
  Filled 2022-11-06: qty 10

## 2022-11-06 MED ORDER — ONDANSETRON HCL 4 MG/2ML IJ SOLN
4.0000 mg | Freq: Three times a day (TID) | INTRAMUSCULAR | Status: DC | PRN
  Administered 2022-11-06 – 2022-11-11 (×6): 4 mg via INTRAVENOUS
  Filled 2022-11-06 (×7): qty 2

## 2022-11-06 NOTE — Progress Notes (Signed)
Patient ID: Kayla [name], female   DOB: 07-Feb-1941, 82 y.o.   MRN: [id number] S: no new complaints this morning.  Tolerated HD yesterday but she wants to hold off on more HD for now. O:BP (!) 127/42 (BP Location: Left Arm)   Pulse (!) 42   Temp 97.6 F (36.4 C) (Oral)   Resp 13   Ht 5\' 5"  (1.651 m)   Wt 102.8 kg Comment: bed  SpO2 98%   BMI 37.71 kg/m   Intake/Output Summary (Last 24 hours) at 11/06/2022 0915 Last data filed at 11/06/2022 0657 Gross per 24 hour  Intake 387 ml  Output 1225 ml  Net -838 ml   Intake/Output: I/O last 3 completed shifts: In: 507 [P.O.:507] Out: 1425 [Urine:425; Other:1000]  Intake/Output this shift:  No intake/output data recorded. Weight change:  Gen: NAD CVS: Bradycardic at 42 Resp:CTA Abd: +BS, soft, NT/ND Ext:1+ pretibial edema bilaterally  Recent Labs  Lab 10/31/22 0016 11/01/22 0021 11/02/22 0016 11/03/22 0013 11/04/22 0004 11/05/22 0002 11/06/22 0023  NA 137 136 136 136 133* 132* 132*  K 4.2 4.0 4.5 4.5 4.4 4.6 4.1  CL 99 99 99 99 96* 95* 95*  CO2 25 27 26 25 25 25 25   GLUCOSE 163* 162* 230* 198* 195* 213* 192*  BUN 67* 62* 67* 73* 74* 77* 51*  CREATININE 3.18* 2.95* 3.37* 3.89* 4.28* 4.36* 3.07*  ALBUMIN 2.4* 2.3* 2.2* 2.3* 2.3* 2.2* 2.5*  CALCIUM 8.7* 8.8* 8.7* 8.6* 8.4* 8.4* 8.4*  PHOS 5.7* 5.4* 6.6* 6.4* 6.2* 5.7* 4.1   Liver Function Tests: Recent Labs  Lab 11/04/22 0004 11/05/22 0002 11/06/22 0023  ALBUMIN 2.3* 2.2* 2.5*   No results for input(s): "LIPASE", "AMYLASE" in the last 168 hours. No results for input(s): "AMMONIA" in the last 168 hours. CBC: Recent Labs  Lab 11/01/22 0021 11/02/22 0016 11/03/22 0013 11/04/22 0004 11/05/22 0002  WBC 12.4* 12.6* 13.5* 11.7* 12.0*  HGB 7.4* 7.7* 8.0* 7.3* 7.4*  HCT 24.8* 26.8* 27.6* 25.0* 25.1*  MCV 96.9 100.0 99.6 100.8* 98.4  PLT 363 355 322 374 385   Cardiac Enzymes: No results for input(s): "CKTOTAL", "CKMB", "CKMBINDEX", "TROPONINI" in the last 168  hours. CBG: Recent Labs  Lab 11/05/22 0607 11/05/22 1250 11/05/22 1638 11/05/22 2132 11/06/22 0614  GLUCAP 170* 149* 220* 195* 156*    Iron Studies: No results for input(s): "IRON", "TIBC", "TRANSFERRIN", "FERRITIN" in the last 72 hours. Studies/Results: IR Fluoro Guide CV Line Right  Result Date: 11/04/2022 INDICATION: 82 year old female in need of hemodialysis. She recently dislodged her right IJ tunneled hemodialysis catheter and requires placement of a new device. EXAM: TUNNELED CENTRAL VENOUS HEMODIALYSIS CATHETER PLACEMENT WITH ULTRASOUND AND FLUOROSCOPIC GUIDANCE MEDICATIONS: No additional antibiotics administered. ANESTHESIA/SEDATION: 1 mg Versed administered for anxiolysis. FLUOROSCOPY TIME:  Radiation exposure index: 3 mGy reference air kerma COMPLICATIONS: None immediate. PROCEDURE: Informed written consent was obtained from the patient after a discussion of the risks, benefits, and alternatives to treatment. Questions regarding the procedure were encouraged and answered. The right neck and chest were prepped with chlorhexidine in a sterile fashion, and a sterile drape was applied covering the operative field. Maximum barrier sterile technique with sterile gowns and gloves were used for the procedure. A timeout was performed prior to the initiation of the procedure. After creating a small venotomy incision, a micropuncture kit was utilized to access the right internal jugular vein under direct, real-time ultrasound guidance after the overlying soft tissues were anesthetized with 1% lidocaine with epinephrine.  Ultrasound image documentation was performed. The microwire was kinked to measure appropriate catheter length. A stiff Glidewire was advanced to the level of the IVC and the micropuncture sheath was exchanged for a peel-away sheath. A palindrome tunneled hemodialysis catheter measuring 19 cm from tip to cuff was tunneled in a retrograde fashion from the anterior chest wall to the  venotomy incision. The catheter was then placed through the peel-away sheath with tips ultimately positioned within the superior aspect of the right atrium. Final catheter positioning was confirmed and documented with a spot radiographic image. The catheter aspirates and flushes normally. The catheter was flushed with appropriate volume heparin dwells. The catheter exit site was secured with a 0-Prolene retention suture. The venotomy incision was closed with Dermabond. Dressings were applied. The patient tolerated the procedure well without immediate post procedural complication. IMPRESSION: Successful placement of 19 cm tip to cuff tunneled hemodialysis catheter via the right internal jugular vein with tips terminating within the superior aspect of the right atrium. The catheter is ready for immediate use. Electronically Signed   By: Malachy Moan M.D.   On: 11/04/2022 15:08    amLODipine  10 mg Oral Daily   apixaban  2.5 mg Oral BID   atorvastatin  40 mg Oral Daily   bisacodyl  10 mg Rectal Daily   Or   bisacodyl  10 mg Oral Daily   Chlorhexidine Gluconate Cloth  6 each Topical Daily   darbepoetin (ARANESP) injection - NON-DIALYSIS  100 mcg Subcutaneous Q Wed-1800   feeding supplement (NEPRO CARB STEADY)  237 mL Oral BID BM   guaiFENesin  600 mg Oral BID   insulin aspart  0-6 Units Subcutaneous TID WC   isosorbide mononitrate  30 mg Oral Daily   levothyroxine  200 mcg Oral Q0600   lidocaine  1 patch Transdermal Q24H   multivitamin  1 tablet Oral QHS   mouth rinse  15 mL Mouth Rinse 4 times per day   sevelamer carbonate  800 mg Oral TID WC   sodium chloride flush  3 mL Intravenous Q12H   torsemide  40 mg Oral Daily    BMET    Component Value Date/Time   NA 132 (L) 11/06/2022 0023   NA 140 02/16/2022 1008   K 4.1 11/06/2022 0023   CL 95 (L) 11/06/2022 0023   CO2 25 11/06/2022 0023   GLUCOSE 192 (H) 11/06/2022 0023   BUN 51 (H) 11/06/2022 0023   BUN 47 (H) 02/16/2022 1008    CREATININE 3.07 (H) 11/06/2022 0023   CREATININE 2.10 (H) 10/07/2022 0939   CALCIUM 8.4 (L) 11/06/2022 0023   CALCIUM 9.9 06/04/2008 2240   GFRNONAA 15 (L) 11/06/2022 0023   GFRAA 45 (L) 02/16/2020 1124   CBC    Component Value Date/Time   WBC 12.0 (H) 11/05/2022 0002   RBC 2.55 (L) 11/05/2022 0002   HGB 7.4 (L) 11/05/2022 0002   HGB 11.3 01/30/2021 1154   HCT 25.1 (L) 11/05/2022 0002   HCT 35.9 01/30/2021 1154   PLT 385 11/05/2022 0002   PLT 389 01/30/2021 1154   MCV 98.4 11/05/2022 0002   MCV 91 01/30/2021 1154   MCH 29.0 11/05/2022 0002   MCHC 29.5 (L) 11/05/2022 0002   RDW 19.0 (H) 11/05/2022 0002   RDW 13.4 01/30/2021 1154   LYMPHSABS 1.4 10/25/2022 0730   MONOABS 1.3 (H) 10/25/2022 0730   EOSABS 0.1 10/25/2022 0730   BASOSABS 0.0 10/25/2022 0730     Assessment/Plan:  Acute kidney injury on CKD stage IV likely due to CHF exacerbation/fluid overload/bradycardia leading to reduced renal perfusion.  B/L Cr around 2, followed by Dr. Noah Charon as an outpatient. She had hyperkalemia, anasarca and did not respond with IV diuretics.  Also had features of uremic encephalopathy including tremor, AMS. -temp HD catheter placed on 6/23, started CRRT 6/23. CRRT off on 6/24 given access issues/alarms. RIJ temp line removed 6/26. Uremic symptoms improved at that time   -Cr down to 2.95 but rose to 3.83 and up to 4.28 yesterday and 4.36 this morning.  Resumed her outpatient torsemide dose of 40 mg daily but worsening UOP and BUN/Cr.   - s/p TDC placement by IR 11/04/22 and had 1 session of IHD 11/05/22.  She wants to hold off on more dialysis at this time.  Will stop torsemide and resume IV lasix 120 mg IV bid and follow UOP and BUN/CR but may need to resume IHD tomorrow if no improvement.  -She is interested in home therapies down the road if/when indicated.  She is a poor longterm HD candidate given her advanced age, multiple co-morbidities, and poor functional and nutritional status.    -normal renal artery duplex today, no evidence of RAS -daily labs, strict I/O -hold off on discharge for now as she may need to have outpatient HD arrangements made prior.    Hyperkalemia: Received medical treatment in the ER.  Improved with CRRT (currently off). On lasix + renal diet. K WNL now   Bradycardia: Seen by cardiology during admit. H/o PPM extraction secondary to bacteremia (Sep 07, 2022).  Off dopamine gtt, HR stable. Avoid beta blockers   Acute on chronic respiratory failure/respiratory distress with hypoxia: lasix as above. CXR 6/25 revealed improvement in opacities. On 3L [place] at baseline @ home -improved.   Sepsis due to possible pneumonia: On antibiotics per primary team.   Hyponatremia, mild: likely secondary to AKI and hypervolemia. WNL now   Anemia: transfuse for Hgb <7. ESA started 6/26, will increase dose of aranesp to 100 mcg. 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  Will start binders, c/w renal diet  Irena Cords, MD [facility]

## 2022-11-06 NOTE — Progress Notes (Signed)
Pt has home CPAP, doesn't want to wear it tonight.

## 2022-11-06 NOTE — Progress Notes (Signed)
Occupational Therapy Treatment Patient Details Name: Kayla [name] MRN: [id number] DOB: 09-17-1940 Today's Date: 11/06/2022   History of present illness Pt is 82 year old female brought to the ED 10/25/22 for SoB and AMS. Found to be in respiratory distress, HR 30s. ABG pH 7.1 with high pCO2 and low bicarbonate, Alo noted to have AKI. CRRT 6/23-6/24  PMH: obesity, type 2 diabetes, hypertension, hyperlipidemia, CAD, congestive heart failure, paroxysmal atrial fibrillation (on Eliquis), sick sinus syndrome status post pacemaker, CKD, anemia, COPD, GERD, hypothyroidism, chronic venous insufficiency, status post left first ray amputation (09/04/2022) with active osteomyelitis   OT comments  Patient with complaints of nausea throughout the day.  She continues to experience dizziness with sit to stand limiting distance and progression.  Patient needing up to Min A for basic transfers, and Max A for lower body ADL.  OT will continue efforts in the acute setting to address deficits, and Patient will benefit from continued inpatient follow up therapy, <3 hours/day.    Recommendations for follow up therapy are one component of a multi-disciplinary discharge planning process, led by the attending physician.  Recommendations may be updated based on patient status, additional functional criteria and insurance authorization.    Assistance Recommended at Discharge Frequent or constant Supervision/Assistance  Patient can return home with the following  A little help with walking and/or transfers;Direct supervision/assist for medications management;Direct supervision/assist for financial management;Assist for transportation;Assistance with cooking/housework;A lot of help with bathing/dressing/bathroom   Equipment Recommendations  None recommended by OT    Recommendations for Other Services      Precautions / Restrictions Precautions Precautions: Fall Precaution Comments: Dizziness noted with position  changes: Restrictions Weight Bearing Restrictions: No       Mobility Bed Mobility               General bed mobility comments: Received sitting upright in recliner; transitioned to bed with minA for LE management Patient Response: Cooperative  Transfers Overall transfer level: Needs assistance Equipment used: Rolling walker (2 wheels) Transfers: Sit to/from Stand Sit to Stand: Min guard                 Balance Overall balance assessment: Needs assistance Sitting-balance support: Feet supported, No upper extremity supported Sitting balance-Leahy Scale: Fair     Standing balance support: Reliant on assistive device for balance Standing balance-Leahy Scale: Poor                             ADL either performed or assessed with clinical judgement   ADL   Eating/Feeding: Supervision/ safety;Sitting   Grooming: Wash/dry hands;Wash/dry face;Supervision/safety;Sitting   Upper Body Bathing: Moderate assistance;Sitting   Lower Body Bathing: Maximal assistance;Sit to/from stand   Upper Body Dressing : Minimal assistance;Sitting   Lower Body Dressing: Maximal assistance;Sit to/from stand   Toilet Transfer: Min guard;Minimal assistance;BSC/3in1;Stand-pivot;Rolling walker (2 wheels)                  Extremity/Trunk Assessment Upper Extremity Assessment Upper Extremity Assessment: Generalized weakness   Lower Extremity Assessment Lower Extremity Assessment: Defer to PT evaluation   Cervical / Trunk Assessment Cervical / Trunk Assessment: Kyphotic    Vision Patient Visual Report: No change from baseline     Perception Perception Perception: Not tested   Praxis Praxis Praxis: Not tested    Cognition Arousal/Alertness: Awake/alert Behavior During Therapy: WFL for tasks assessed/performed Overall Cognitive Status: Impaired/Different from baseline  Memory: Decreased short-term memory Following Commands:  Follows one step commands consistently     Problem Solving: Requires verbal cues, Requires tactile cues                             Pertinent Vitals/ Pain       Pain Assessment Pain Assessment: Faces Faces Pain Scale: Hurts a little bit Pain Location: Ribs Pain Descriptors / Indicators: Tender Pain Intervention(s): Monitored during session                                                          Frequency  Min 1X/week        Progress Toward Goals  OT Goals(current goals can now be found in the care plan section)  Progress towards OT goals: Progressing toward goals  Acute Rehab OT Goals OT Goal Formulation: With patient Time For Goal Achievement: 11/12/22 Potential to Achieve Goals: Fair  Plan Discharge plan needs to be updated    Co-evaluation                 AM-PAC OT "6 Clicks" Daily Activity     Outcome Measure   Help from another person eating meals?: None Help from another person taking care of personal grooming?: A Little Help from another person toileting, which includes using toliet, bedpan, or urinal?: A Little Help from another person bathing (including washing, rinsing, drying)?: A Lot Help from another person to put on and taking off regular upper body clothing?: A Little Help from another person to put on and taking off regular lower body clothing?: A Lot 6 Click Score: 17    End of Session Equipment Utilized During Treatment: Rolling walker (2 wheels);Oxygen  OT Visit Diagnosis: Unsteadiness on feet (R26.81);Muscle weakness (generalized) (M62.81);Other symptoms and signs involving cognitive function   Activity Tolerance Patient tolerated treatment well   Patient Left in chair;with call bell/phone within reach;with family/visitor present   Nurse Communication Mobility status        Time: 1610-9604 OT Time Calculation (min): 21 min  Charges: OT General Charges $OT Visit: 1 Visit OT  Treatments $Self Care/Home Management : 8-22 mins  11/06/2022  RP, OTR/L  Acute Rehabilitation Services  Office:  [phone]   Suzanna Obey 11/06/2022, 5:02 PM

## 2022-11-06 NOTE — TOC Progression Note (Signed)
Transition of Care [facility]) - Progression Note    Patient Details  Name: Kayla [name] MRN: [id number] Date of Birth: February 07, 1941  [facility]) CM/SW Contact  Reva Bores, LCSWA Phone Number: 11/06/2022, 4:42 PM  Clinical Narrative:  CSW met with pt, her husband, and her daughter at the pts beside to discuss SNF options. Pt and family stated that they would like [facility] their first option, [facility] as their second, [facility] as the third, and [facility] as the fourth. CSW checked with [facility] and if the pt requires dialysis they are not able to accept pt. CSW will continue to follow. Follow up with SNF once dialysis plan is established.     Expected Discharge Plan: Skilled Nursing Facility Barriers to Discharge: Continued Medical Work up  Expected Discharge Plan and Services   Discharge Planning Services: CM Consult Post Acute Care Choice: Home Health Living arrangements for the past 2 months: Single Family Home                 DME Arranged: Wheelchair manual DME Agency: [employer] Date DME Agency Contacted: 11/02/22 Time DME Agency Contacted: 1444 Representative spoke with at DME Agency: [facility] HH Arranged: RN, PT HH Agency: [facility] Date [facility] Agency Contacted: 10/29/22 Time HH Agency Contacted: 1525 Representative spoke with at [facility] Agency: Haywood Lasso   Social Determinants of Health (SDOH) Interventions SDOH Screenings   Food Insecurity: No Food Insecurity (11/04/2022)  Housing: Patient Declined (11/04/2022)  Transportation Needs: No Transportation Needs (11/04/2022)  Utilities: Not At Risk (11/04/2022)  Alcohol Screen: Low Risk  (01/31/2021)  Depression (PHQ2-9): Low Risk  (10/15/2022)  Financial Resource Strain: Low Risk  (01/31/2021)  Physical Activity: Insufficiently Active (09/30/2021)  Social Connections: Socially Integrated (09/30/2021)  Stress: No Stress Concern Present (09/30/2021)  Tobacco Use: Medium Risk (11/04/2022)    Readmission Risk  Interventions    10/01/2022    3:52 PM 01/22/2022    2:43 PM 07/23/2021   12:10 PM  Readmission Risk Prevention Plan  Transportation Screening Complete Complete Complete  PCP or Specialist Appt within 3-5 Days Complete Complete   HRI or Home Care Consult  Complete   Social Work Consult for Recovery Care Planning/Counseling Complete Complete   Palliative Care Screening Not Applicable Not Applicable   Medication Review Oceanographer) Referral to Pharmacy Complete Complete  HRI or Home Care Consult   Complete  SW Recovery Care/Counseling Consult   Complete  Palliative Care Screening   Not Applicable  Skilled Nursing Facility   Complete

## 2022-11-06 NOTE — TOC Progression Note (Signed)
Transition of Care [facility]) - Progression Note    Patient Details  Name: Kayla [name] MRN: [id number] Date of Birth: [birth year]  Transition of Care [facility]) CM/SW Contact  Leander Rams, LCSW Phone Number: 11/06/2022, 1:00 PM  Clinical Narrative:    CSW met with pt at bedside to go over SNF bed offers. Pt stated she will review facilities when her husband returns. Pt will inform CSW of decision.   TOC will continue to follow.   Expected Discharge Plan: Skilled Nursing Facility Barriers to Discharge: Continued Medical Work up  Expected Discharge Plan and Services   Discharge Planning Services: CM Consult Post Acute Care Choice: Home Health Living arrangements for the past 2 months: Single Family Home                 DME Arranged: Wheelchair manual DME Agency: [employer] Date DME Agency Contacted: 11/02/22 Time DME Agency Contacted: 1444 Representative spoke with at DME Agency: Zack HH Arranged: RN, PT HH Agency: [facility] Date [facility] Agency Contacted: 10/29/22 Time HH Agency Contacted: 1525 Representative spoke with at [facility] Agency: Haywood Lasso   Social Determinants of Health (SDOH) Interventions SDOH Screenings   Food Insecurity: No Food Insecurity (11/04/2022)  Housing: Patient Declined (11/04/2022)  Transportation Needs: No Transportation Needs (11/04/2022)  Utilities: Not At Risk (11/04/2022)  Alcohol Screen: Low Risk  (01/31/2021)  Depression (PHQ2-9): Low Risk  (10/15/2022)  Financial Resource Strain: Low Risk  (01/31/2021)  Physical Activity: Insufficiently Active (09/30/2021)  Social Connections: Socially Integrated (09/30/2021)  Stress: No Stress Concern Present (09/30/2021)  Tobacco Use: Medium Risk (11/04/2022)    Readmission Risk Interventions    10/01/2022    3:52 PM 01/22/2022    2:43 PM 07/23/2021   12:10 PM  Readmission Risk Prevention Plan  Transportation Screening Complete Complete Complete  PCP or Specialist Appt within 3-5 Days Complete Complete   HRI or Home  Care Consult  Complete   Social Work Consult for Recovery Care Planning/Counseling Complete Complete   Palliative Care Screening Not Applicable Not Applicable   Medication Review Oceanographer) Referral to Pharmacy Complete Complete  HRI or Home Care Consult   Complete  SW Recovery Care/Counseling Consult   Complete  Palliative Care Screening   Not Applicable  Skilled Nursing Facility   Complete   Oletta Lamas, MSW, Rosa Sanchez, LCASA Transitions of Care  Clinical Social Worker I

## 2022-11-06 NOTE — Progress Notes (Signed)
PROGRESS NOTE  Kayla [name] OZH:[id number] DOB: 05/25/1940 DOA: 10/25/2022 PCP: Ailene Ravel, MD  Brief History   Patient is an 82 year old Caucasian female, significantly obese, with recent history of MRSA bacteremia/infective endocarditis for which patient was on antibiotics for a long time.  Other medical history includes coronary artery disease, carotid artery disease, chronic kidney disease stage IV, COPD, diabetes mellitus type 2, hypertension, hyperlipidemia, hypothyroidism and documented congestive heart failure.  Recent echocardiograms have revealed normal left ventricular ejection fraction, without mention of diastolic dysfunction.  Patient was admitted with pulmonary edema, acute kidney injury on chronic kidney disease stage IV with oliguria versus anuria, requiring CRRT for about 48 hours.  Improvement in renal function is noted.  Patient continues to make urine.  Hypercapnic respiratory failure was also noted on presentation.  Patient has been on home oxygen for but a year.   Patient was admitted to ICU, underwent CRRT, treated for respiratory failure and pulmonary edema.  TRH assumed care on 10/30/2022.    The patient has been continued on lasix 80 mg IV bid. Doppler of renal arteries was normal. Rib x-ray discovered that aminimally displaced posterolateral left seventh rib fracture is the likely cause of the patient's left upper quadrant/left lateral chest wall pain. Over the course of the last 3 days that patient's renal function has initially improved from 3.18 to 2.95 and then worsened (from 2.95 to 3.38 to  3.89. Nephrology is aware. It has not been possible to follow the patient's volume status carefully, as nursing has not been documenting output from purewick. (Patient states that it doesn't work, and urine goes everywhere). IV fluids had been stopped on 11/02/2022. It seems that the patient's intake of fluids is very low. She has been encouraged to drink. Nephrology has changed  the patient's diuretic from laisx 80 mg IV bid to torsemide 40 mg daily which was her home dose of diuretic. Monitor carefully.   Palliative care has been consulted. They have confirmed that the patient wishes to be a DNR, but wants everything otherwise to be done to keep her alive. This would include HD. Due to creatinine continued upward course, nephrology is considering HD.  11/06/2022: No new complaints.  No hemodialysis done today.  Will discharge patient once cleared for discharge by the nephrology team.  Patient is also on IV Lasix 120 Mg twice daily.    Subjective  -No new complaints.  No shortness of breath.  No chest pain.  Patient has right chest TDC. Objective   Vitals:  Vitals:   11/06/22 0816 11/06/22 0830  BP: (!) 146/40 (!) 127/42  Pulse:  (!) 42  Resp:  13  Temp:  97.6 F (36.4 C)  SpO2:  98%    Exam:  Constitutional:  The patient is awake, alert, and oriented x 3. No acute distress. Respiratory:  Decreased air entry globally. Cardiovascular:  S1-S2.   Abdomen:  Abdomen is obese, soft and nontender.   Musculoskeletal:  Bilateral lower extremity edema.  Neurologic:  Awake and alert.  Moves all extremities.  I have personally reviewed the following:   Today's Data   Vitals:   11/06/22 0816 11/06/22 0830  BP: (!) 146/40 (!) 127/42  Pulse:  (!) 42  Resp:  13  Temp:  97.6 F (36.4 C)  SpO2:  98%     Lab Data      Latest Ref Rng & Units 11/05/2022   12:02 AM 11/04/2022   12:04 AM 11/03/2022   12:13 AM  CBC  WBC 4.0 - 10.5 K/uL 12.0  11.7  13.5   Hemoglobin 12.0 - 15.0 g/dL 7.4  7.3  8.0   Hematocrit 36.0 - 46.0 % 25.1  25.0  27.6   Platelets 150 - 400 K/uL 385  374  322       Latest Ref Rng & Units 11/06/2022   12:23 AM 11/05/2022   12:02 AM 11/04/2022   12:04 AM  BMP  Glucose 70 - 99 mg/dL 161  096  045   BUN 8 - 23 mg/dL 51  77  74   Creatinine 0.44 - 1.00 mg/dL 4.09  8.11  9.14   Sodium 135 - 145 mmol/L 132  132  133   Potassium 3.5 - 5.1  mmol/L 4.1  4.6  4.4   Chloride 98 - 111 mmol/L 95  95  96   CO2 22 - 32 mmol/L 25  25  25    Calcium 8.9 - 10.3 mg/dL 8.4  8.4  8.4     Micro Data   Results for orders placed or performed during the hospital encounter of 10/25/22  Culture, blood (routine x 2)     Status: None   Collection Time: 10/25/22  8:24 AM   Specimen: BLOOD LEFT ARM  Result Value Ref Range Status   Specimen Description BLOOD LEFT ARM  Final   Special Requests   Final    BOTTLES DRAWN AEROBIC AND ANAEROBIC Blood Culture adequate volume   Culture   Final    NO GROWTH 5 DAYS Performed at [facility], [address] [place], [place] [address]    Report Status 10/30/2022 FINAL  Final  Culture, blood (routine x 2)     Status: None   Collection Time: 10/25/22  8:26 AM   Specimen: BLOOD  Result Value Ref Range Status   Specimen Description BLOOD SITE NOT SPECIFIED  Final   Special Requests   Final    BOTTLES DRAWN AEROBIC AND ANAEROBIC Blood Culture results may not be optimal due to an inadequate volume of blood received in culture bottles   Culture   Final    NO GROWTH 5 DAYS Performed at [facility], [address], [place], [place] [address]    Report Status 10/30/2022 FINAL  Final  MRSA Next Gen by PCR, Nasal     Status: None   Collection Time: 10/25/22 11:46 AM   Specimen: Nasal Mucosa; Nasal Swab  Result Value Ref Range Status   MRSA by PCR Next Gen NOT DETECTED NOT DETECTED Final    Comment: (NOTE) The GeneXpert MRSA Assay (FDA approved for NASAL specimens only), is one component of a comprehensive MRSA colonization surveillance program. It is not intended to diagnose MRSA infection nor to guide or monitor treatment for MRSA infections. Test performance is not FDA approved in patients less than 42 years old. Performed at [facility], [address] [place], [place] [address]     Scheduled Meds:  amLODipine  10 mg Oral Daily   apixaban  2.5 mg Oral BID   atorvastatin  40  mg Oral Daily   bisacodyl  10 mg Rectal Daily   Or   bisacodyl  10 mg Oral Daily   Chlorhexidine Gluconate Cloth  6 each Topical Daily   darbepoetin (ARANESP) injection - NON-DIALYSIS  100 mcg Subcutaneous Q Wed-1800   feeding supplement (NEPRO CARB STEADY)  237 mL Oral BID BM   guaiFENesin  600 mg Oral  BID   insulin aspart  0-6 Units Subcutaneous TID WC   isosorbide mononitrate  30 mg Oral Daily   levothyroxine  200 mcg Oral Q0600   lidocaine  1 patch Transdermal Q24H   multivitamin  1 tablet Oral QHS   mouth rinse  15 mL Mouth Rinse 4 times per day   sevelamer carbonate  800 mg Oral TID WC   sodium chloride flush  3 mL Intravenous Q12H   torsemide  40 mg Oral Daily   Continuous Infusions:  sodium chloride Stopped (10/26/22 1934)    Principal Problem:   Acute respiratory failure with hypoxia and hypercapnia (HCC) Active Problems:   AKI (acute kidney injury) (HCC)   Junctional bradycardia   Hyperkalemia   LOS: 12 days    A & P  Assessment & Plan:   Principal Problem:   Acute respiratory failure with hypoxia and hypercapnia (HCC) Active Problems:   AKI (acute kidney injury) (HCC)   Junctional bradycardia   Hyperkalemia   Acute kidney injury on chronic kidney disease stage IV/pulmonary edema: -AKI is worsening. -Serum creatinine has increased to 4.28. It has been increasing for the last 3 days. -Nephrology has changed diuresis from lasix 80 mg bid to torsemide 40 mg daily, but creatinine continues to rise. -Patient was on CRRT for about 48 hours. She states that she would agree to HD. Nephrology is now considering it. -The patient has had poor PO intake of fluids. She has been encouraged to drink, but she states that she doesn't like to drink, because she fears wetting the bed.  -Strict I's and O's. -Continue to monitor input and input. -Avoid nephrotoxins. -Dose all medications assuming GFR of less than 10 mL/min. -Keep MAP greater than 65 mmHg. 11/05/2022: Patient  had hemodialysis today.  Nephrology input is appreciated.   Acute on chronic hypoxic/hypercapnic respiratory failure: -Patient has been on oxygen for about 1 year (2 L/min via nasal cannula). -Etiology is likely multifactorial. -Pt complains of increasing work of breathing. Likely due to worsening renal statu -Discussed need to comply with CPAP management and, treatment of OSA/likely undiagnosed OHS. 11/05/2022: Stable.   COPD: -Seems compensated. -Continue current regimen.   Left lateral chest wall pain: -X-ray of the ribs revealed minimally displaced posterolateral left seventh rib fracture.  -Adequate pain control. -Lidocaine patch to left lateral rib cage area. -Supportive management.   Paroxysmal atrial fibrillation: -Controlled heart rate. -Patient is on apixaban 2.5 Mg p.o. twice daily.   Anemia of chronic kidney disease: -Patient has received IV iron. -Patient is on Aranesp every 2 weeks.   Hypertension: -Continue to optimize. -Goal blood pressure should be less than 130/80 mmHg.   Diabetes mellitus:  -Continue sliding scale insulin coverage. -Continue to monitor blood sugar and adjust accordingly.   Hyperlipidemia: -Continue atorvastatin.   Possible community acquired pneumonia: -Findings/events may be suggestive of pulmonary edema. -IV Zosyn has been discontinued.     DVT prophylaxis: Apixaban. Code Status: DO NOT RESUSCITATE. Family Communication: Husband. Disposition Plan: This will depend on hospital course.     Consultants:  Nephrology.   Procedures:  Patient was on CRRT.   Antimicrobials:  IV Zosyn has been discontinued.    Berton Mount, MD. [facility] Direct contact: see [email or web address]  7PM-7AM contact night coverage as above 11/04/2022, 5:43 PM  LOS: 9 days

## 2022-11-07 DIAGNOSIS — J9602 Acute respiratory failure with hypercapnia: Secondary | ICD-10-CM | POA: Diagnosis not present

## 2022-11-07 DIAGNOSIS — J9601 Acute respiratory failure with hypoxia: Secondary | ICD-10-CM | POA: Diagnosis not present

## 2022-11-07 LAB — GLUCOSE, CAPILLARY
Glucose-Capillary: 138 mg/dL — ABNORMAL HIGH (ref 70–99)
Glucose-Capillary: 171 mg/dL — ABNORMAL HIGH (ref 70–99)
Glucose-Capillary: 182 mg/dL — ABNORMAL HIGH (ref 70–99)
Glucose-Capillary: 160 mg/dL — ABNORMAL HIGH (ref 70–99)

## 2022-11-07 LAB — CBC
HCT: 25.2 % — ABNORMAL LOW (ref 36.0–46.0)
Hemoglobin: 7.6 g/dL — ABNORMAL LOW (ref 12.0–15.0)
MCH: 29.7 pg (ref 26.0–34.0)
MCHC: 30.2 g/dL (ref 30.0–36.0)
MCV: 98.4 fL (ref 80.0–100.0)
Platelets: 387 10*3/uL (ref 150–400)
RBC: 2.56 MIL/uL — ABNORMAL LOW (ref 3.87–5.11)
RDW: 19.7 % — ABNORMAL HIGH (ref 11.5–15.5)
WBC: 11.5 10*3/uL — ABNORMAL HIGH (ref 4.0–10.5)
nRBC: 0.5 % — ABNORMAL HIGH (ref 0.0–0.2)

## 2022-11-07 LAB — RENAL FUNCTION PANEL
Albumin: 2.3 g/dL — ABNORMAL LOW (ref 3.5–5.0)
Anion gap: 12 (ref 5–15)
BUN: 56 mg/dL — ABNORMAL HIGH (ref 8–23)
CO2: 27 mmol/L (ref 22–32)
Calcium: 8.5 mg/dL — ABNORMAL LOW (ref 8.9–10.3)
Chloride: 97 mmol/L — ABNORMAL LOW (ref 98–111)
Creatinine, Ser: 3.16 mg/dL — ABNORMAL HIGH (ref 0.44–1.00)
GFR, Estimated: 14 mL/min — ABNORMAL LOW (ref >60)
Glucose, Bld: 208 mg/dL — ABNORMAL HIGH (ref 70–99)
Phosphorus: 4.6 mg/dL (ref 2.5–4.6)
Potassium: 4.2 mmol/L (ref 3.5–5.1)
Sodium: 136 mmol/L (ref 135–145)

## 2022-11-07 LAB — MAGNESIUM: Magnesium: 1.9 mg/dL (ref 1.7–2.4)

## 2022-11-07 NOTE — Progress Notes (Signed)
PROGRESS NOTE  Kayla [name] ZOX:[id number] DOB: [birth year] DOA: 10/25/2022 PCP: Ailene Ravel, MD  Brief History   Patient is an 82 year old Caucasian female, significantly obese, with recent history of MRSA bacteremia/infective endocarditis for which patient was on antibiotics for a long time.  Other medical history includes coronary artery disease, carotid artery disease, chronic kidney disease stage IV, COPD, diabetes mellitus type 2, hypertension, hyperlipidemia, hypothyroidism and documented congestive heart failure.  Recent echocardiograms have revealed normal left ventricular ejection fraction, without mention of diastolic dysfunction.  Patient was admitted with pulmonary edema, acute kidney injury on chronic kidney disease stage IV with oliguria versus anuria, requiring CRRT for about 48 hours.  Improvement in renal function is noted.  Patient continues to make urine.  Hypercapnic respiratory failure was also noted on presentation.  Patient has been on home oxygen for but a year.   Patient was admitted to ICU, underwent CRRT, treated for respiratory failure and pulmonary edema.  TRH assumed care on 10/30/2022.    The patient has been continued on lasix 80 mg IV bid. Doppler of renal arteries was normal. Rib x-ray discovered that aminimally displaced posterolateral left seventh rib fracture is the likely cause of the patient's left upper quadrant/left lateral chest wall pain. Over the course of the last 3 days that patient's renal function has initially improved from 3.18 to 2.95 and then worsened (from 2.95 to 3.38 to  3.89. Nephrology is aware. It has not been possible to follow the patient's volume status carefully, as nursing has not been documenting output from purewick. (Patient states that it doesn't work, and urine goes everywhere). IV fluids had been stopped on 11/02/2022. It seems that the patient's intake of fluids is very low. She has been encouraged to drink. Nephrology has changed  the patient's diuretic from laisx 80 mg IV bid to torsemide 40 mg daily which was her home dose of diuretic. Monitor carefully.   Palliative care has been consulted. They have confirmed that the patient wishes to be a DNR, but wants everything otherwise to be done to keep her alive. This would include HD. Due to creatinine continued upward course, nephrology is considering HD.  11/06/2022: No new complaints.  No hemodialysis done today.  Will discharge patient once cleared for discharge by the nephrology team.  Patient is also on IV Lasix 120 Mg twice daily.    11/07/2022: Patient seen alongside patient's husband.  No new complaints.  No renal replacement therapy today.  Patient is on IV Lasix 120 Mg twice daily.  Continue to assess need for renal replacement therapy daily.  Discharge patient home once cleared for discharge by the nephrology team.  Subjective  -No new complaints.  No shortness of breath.  No chest pain.  Patient has right chest TDC. Objective   Vitals:  Vitals:   11/07/22 0345 11/07/22 1120  BP: (!) 133/97 (!) 131/41  Pulse: (!) 45 (!) 45  Resp: 17 14  Temp: 98 F (36.7 C) 97.6 F (36.4 C)  SpO2: 97% 97%    Exam:  Constitutional:  The patient is awake, alert, and oriented x 3. No acute distress. Respiratory:  Decreased air entry globally. Cardiovascular:  S1-S2.   Abdomen:  Abdomen is obese, soft and nontender.   Musculoskeletal:  Bilateral lower extremity edema.  Neurologic:  Awake and alert.  Moves all extremities.   I have personally reviewed the following:   Today's Data   Vitals:   11/07/22 0345 11/07/22 1120  BP: Marland Kitchen)  133/97 (!) 131/41  Pulse: (!) 45 (!) 45  Resp: 17 14  Temp: 98 F (36.7 C) 97.6 F (36.4 C)  SpO2: 97% 97%     Lab Data      Latest Ref Rng & Units 11/07/2022   12:10 AM 11/05/2022   12:02 AM 11/04/2022   12:04 AM  CBC  WBC 4.0 - 10.5 K/uL 11.5  12.0  11.7   Hemoglobin 12.0 - 15.0 g/dL 7.6  7.4  7.3   Hematocrit 36.0 - 46.0  % 25.2  25.1  25.0   Platelets 150 - 400 K/uL 387  385  374       Latest Ref Rng & Units 11/07/2022   12:10 AM 11/06/2022   12:23 AM 11/05/2022   12:02 AM  BMP  Glucose 70 - 99 mg/dL 409  811  914   BUN 8 - 23 mg/dL 56  51  77   Creatinine 0.44 - 1.00 mg/dL 7.82  9.56  2.13   Sodium 135 - 145 mmol/L 136  132  132   Potassium 3.5 - 5.1 mmol/L 4.2  4.1  4.6   Chloride 98 - 111 mmol/L 97  95  95   CO2 22 - 32 mmol/L 27  25  25    Calcium 8.9 - 10.3 mg/dL 8.5  8.4  8.4     Micro Data   Results for orders placed or performed during the hospital encounter of 10/25/22  Culture, blood (routine x 2)     Status: None   Collection Time: 10/25/22  8:24 AM   Specimen: BLOOD LEFT ARM  Result Value Ref Range Status   Specimen Description BLOOD LEFT ARM  Final   Special Requests   Final    BOTTLES DRAWN AEROBIC AND ANAEROBIC Blood Culture adequate volume   Culture   Final    NO GROWTH 5 DAYS Performed at [facility], [address] [place], [place] [address]    Report Status 10/30/2022 FINAL  Final  Culture, blood (routine x 2)     Status: None   Collection Time: 10/25/22  8:26 AM   Specimen: BLOOD  Result Value Ref Range Status   Specimen Description BLOOD SITE NOT SPECIFIED  Final   Special Requests   Final    BOTTLES DRAWN AEROBIC AND ANAEROBIC Blood Culture results may not be optimal due to an inadequate volume of blood received in culture bottles   Culture   Final    NO GROWTH 5 DAYS Performed at [facility], [address] [place], [place] [address]    Report Status 10/30/2022 FINAL  Final  MRSA Next Gen by PCR, Nasal     Status: None   Collection Time: 10/25/22 11:46 AM   Specimen: Nasal Mucosa; Nasal Swab  Result Value Ref Range Status   MRSA by PCR Next Gen NOT DETECTED NOT DETECTED Final    Comment: (NOTE) The GeneXpert MRSA Assay (FDA approved for NASAL specimens only), is one component of a comprehensive MRSA colonization surveillance program. It is not  intended to diagnose MRSA infection nor to guide or monitor treatment for MRSA infections. Test performance is not FDA approved in patients less than 19 years old. Performed at [facility], [address], [place], [place] [address]     Scheduled Meds:  amLODipine  10 mg Oral Daily   apixaban  2.5 mg Oral BID   atorvastatin  40 mg Oral Daily   bisacodyl  10 mg Rectal Daily   Or   bisacodyl  10 mg Oral Daily   Chlorhexidine Gluconate Cloth  6 each Topical Daily   darbepoetin (ARANESP) injection - NON-DIALYSIS  100 mcg Subcutaneous Q Wed-1800   feeding supplement (NEPRO CARB STEADY)  237 mL Oral BID BM   guaiFENesin  600 mg Oral BID   insulin aspart  0-6 Units Subcutaneous TID WC   isosorbide mononitrate  30 mg Oral Daily   levothyroxine  200 mcg Oral Q0600   lidocaine  1 patch Transdermal Q24H   multivitamin  1 tablet Oral QHS   mouth rinse  15 mL Mouth Rinse 4 times per day   sevelamer carbonate  800 mg Oral TID WC   sodium chloride flush  3 mL Intravenous Q12H   Continuous Infusions:  sodium chloride Stopped (10/26/22 1934)   furosemide 120 mg (11/07/22 0810)    Principal Problem:   Acute respiratory failure with hypoxia and hypercapnia (HCC) Active Problems:   AKI (acute kidney injury) (HCC)   Junctional bradycardia   Hyperkalemia   LOS: 13 days    A & P  Assessment & Plan:   Principal Problem:   Acute respiratory failure with hypoxia and hypercapnia (HCC) Active Problems:   AKI (acute kidney injury) (HCC)   Junctional bradycardia   Hyperkalemia   Acute kidney injury on chronic kidney disease stage IV/pulmonary edema: -AKI is worsening. -Serum creatinine has increased to 4.28. It has been increasing for the last 3 days. -Nephrology has changed diuresis from lasix 80 mg bid to torsemide 40 mg daily, but creatinine continues to rise. -Patient was on CRRT for about 48 hours. She states that she would agree to HD. Nephrology is now considering it. -The  patient has had poor PO intake of fluids. She has been encouraged to drink, but she states that she doesn't like to drink, because she fears wetting the bed.  -Strict I's and O's. -Continue to monitor input and input. -Avoid nephrotoxins. -Dose all medications assuming GFR of less than 10 mL/min. -Keep MAP greater than 65 mmHg. 11/05/2022: Patient had hemodialysis today.  Nephrology input is appreciated.   Acute on chronic hypoxic/hypercapnic respiratory failure: -Patient has been on oxygen for about 1 year (2 L/min via nasal cannula). -Etiology is likely multifactorial. -Pt complains of increasing work of breathing. Likely due to worsening renal statu -Discussed need to comply with CPAP management and, treatment of OSA/likely undiagnosed OHS. 11/05/2022: Stable.   COPD: -Seems compensated. -Continue current regimen.   Left lateral chest wall pain: -X-ray of the ribs revealed minimally displaced posterolateral left seventh rib fracture.  -Adequate pain control. -Lidocaine patch to left lateral rib cage area. -Supportive management.   Paroxysmal atrial fibrillation: -Controlled heart rate. -Patient is on apixaban 2.5 Mg p.o. twice daily.   Anemia of chronic kidney disease: -Patient has received IV iron. -Patient is on Aranesp every 2 weeks.   Hypertension: -Continue to optimize. -Goal blood pressure should be less than 130/80 mmHg.   Diabetes mellitus:  -Continue sliding scale insulin coverage. -Continue to monitor blood sugar and adjust accordingly.   Hyperlipidemia: -Continue atorvastatin.   Possible community acquired pneumonia: -Findings/events may be suggestive of pulmonary edema. -IV Zosyn has been discontinued.     DVT prophylaxis: Apixaban. Code Status: DO NOT RESUSCITATE. Family Communication: Husband. Disposition Plan: This will depend on hospital course.     Consultants:  Nephrology.   Procedures:  Patient was on CRRT.   Antimicrobials:  IV Zosyn  has been discontinued.    Berton Mount, MD. [facility] Direct contact: see [email or web address]  7PM-7AM contact night coverage as above 11/04/2022, 5:43 PM  LOS: 9 days

## 2022-11-07 NOTE — Progress Notes (Signed)
   11/07/22 0119  BiPAP/CPAP/SIPAP  Reason BIPAP/CPAP not in use Non-compliant  Patient Home Equipment Yes  BiPAP/CPAP /SiPAP Vitals  Pulse Rate (!) 43  Resp 14  SpO2 96 %  Bilateral Breath Sounds Diminished  MEWS Score/Color  MEWS Score 1  MEWS Score Color Green

## 2022-11-07 NOTE — Progress Notes (Signed)
Patient ID: Kayla [name], female   DOB: 01/13/1941, 82 y.o.   MRN: [id number] S: No new complaints O:BP (!) 133/97 (BP Location: Left Arm)   Pulse (!) 45   Temp 98 F (36.7 C) (Oral)   Resp 17   Ht 5\' 5"  (1.651 m)   Wt 102.8 kg Comment: bed  SpO2 97%   BMI 37.71 kg/m   Intake/Output Summary (Last 24 hours) at 11/07/2022 0951 Last data filed at 11/07/2022 0345 Gross per 24 hour  Intake 402.02 ml  Output 600 ml  Net -197.98 ml   Intake/Output: I/O last 3 completed shifts: In: 502 [P.O.:440; IV Piggyback:62] Out: 1025 [Urine:725; Emesis/NG output:300]  Intake/Output this shift:  No intake/output data recorded. Weight change:  Gen: NAD CVS: bradycardic Resp:CTA Abd: +BS, soft, NT/ND Ext: 1+ pretibial edema  Recent Labs  Lab 11/01/22 0021 11/02/22 0016 11/03/22 0013 11/04/22 0004 11/05/22 0002 11/06/22 0023 11/07/22 0010  NA 136 136 136 133* 132* 132* 136  K 4.0 4.5 4.5 4.4 4.6 4.1 4.2  CL 99 99 99 96* 95* 95* 97*  CO2 27 26 25 25 25 25 27   GLUCOSE 162* 230* 198* 195* 213* 192* 208*  BUN 62* 67* 73* 74* 77* 51* 56*  CREATININE 2.95* 3.37* 3.89* 4.28* 4.36* 3.07* 3.16*  ALBUMIN 2.3* 2.2* 2.3* 2.3* 2.2* 2.5* 2.3*  CALCIUM 8.8* 8.7* 8.6* 8.4* 8.4* 8.4* 8.5*  PHOS 5.4* 6.6* 6.4* 6.2* 5.7* 4.1 4.6   Liver Function Tests: Recent Labs  Lab 11/05/22 0002 11/06/22 0023 11/07/22 0010  ALBUMIN 2.2* 2.5* 2.3*   No results for input(s): "LIPASE", "AMYLASE" in the last 168 hours. No results for input(s): "AMMONIA" in the last 168 hours. CBC: Recent Labs  Lab 11/02/22 0016 11/03/22 0013 11/04/22 0004 11/05/22 0002 11/07/22 0010  WBC 12.6* 13.5* 11.7* 12.0* 11.5*  HGB 7.7* 8.0* 7.3* 7.4* 7.6*  HCT 26.8* 27.6* 25.0* 25.1* 25.2*  MCV 100.0 99.6 100.8* 98.4 98.4  PLT 355 322 374 385 387   Cardiac Enzymes: No results for input(s): "CKTOTAL", "CKMB", "CKMBINDEX", "TROPONINI" in the last 168 hours. CBG: Recent Labs  Lab 11/06/22 0614 11/06/22 1114 11/06/22 1653  11/06/22 2150 11/07/22 0611  GLUCAP 156* 167* 250* 237* 138*    Iron Studies: No results for input(s): "IRON", "TIBC", "TRANSFERRIN", "FERRITIN" in the last 72 hours. Studies/Results: No results found.  amLODipine  10 mg Oral Daily   apixaban  2.5 mg Oral BID   atorvastatin  40 mg Oral Daily   bisacodyl  10 mg Rectal Daily   Or   bisacodyl  10 mg Oral Daily   Chlorhexidine Gluconate Cloth  6 each Topical Daily   darbepoetin (ARANESP) injection - NON-DIALYSIS  100 mcg Subcutaneous Q Wed-1800   feeding supplement (NEPRO CARB STEADY)  237 mL Oral BID BM   guaiFENesin  600 mg Oral BID   insulin aspart  0-6 Units Subcutaneous TID WC   isosorbide mononitrate  30 mg Oral Daily   levothyroxine  200 mcg Oral Q0600   lidocaine  1 patch Transdermal Q24H   multivitamin  1 tablet Oral QHS   mouth rinse  15 mL Mouth Rinse 4 times per day   sevelamer carbonate  800 mg Oral TID WC   sodium chloride flush  3 mL Intravenous Q12H    BMET    Component Value Date/Time   NA 136 11/07/2022 0010   NA 140 02/16/2022 1008   K 4.2 11/07/2022 0010   CL 97 (L)  11/07/2022 0010   CO2 27 11/07/2022 0010   GLUCOSE 208 (H) 11/07/2022 0010   BUN 56 (H) 11/07/2022 0010   BUN 47 (H) 02/16/2022 1008   CREATININE 3.16 (H) 11/07/2022 0010   CREATININE 2.10 (H) 10/07/2022 0939   CALCIUM 8.5 (L) 11/07/2022 0010   CALCIUM 9.9 06/04/2008 2240   GFRNONAA 14 (L) 11/07/2022 0010   GFRAA 45 (L) 02/16/2020 1124   CBC    Component Value Date/Time   WBC 11.5 (H) 11/07/2022 0010   RBC 2.56 (L) 11/07/2022 0010   HGB 7.6 (L) 11/07/2022 0010   HGB 11.3 01/30/2021 1154   HCT 25.2 (L) 11/07/2022 0010   HCT 35.9 01/30/2021 1154   PLT 387 11/07/2022 0010   PLT 389 01/30/2021 1154   MCV 98.4 11/07/2022 0010   MCV 91 01/30/2021 1154   MCH 29.7 11/07/2022 0010   MCHC 30.2 11/07/2022 0010   RDW 19.7 (H) 11/07/2022 0010   RDW 13.4 01/30/2021 1154   LYMPHSABS 1.4 10/25/2022 0730   MONOABS 1.3 (H) 10/25/2022 0730    EOSABS 0.1 10/25/2022 0730   BASOSABS 0.0 10/25/2022 0730     Assessment/Plan:   Acute kidney injury on CKD stage IV likely due to CHF exacerbation/fluid overload/bradycardia leading to reduced renal perfusion.  B/L Cr around 2, followed by Dr. Noah Charon as an outpatient. She had hyperkalemia, anasarca and did not respond with IV diuretics.  Also had features of uremic encephalopathy including tremor, AMS. -temp HD catheter placed on 6/23, started CRRT 6/23. CRRT off on 6/24 given access issues/alarms. RIJ temp line removed 6/26. Uremic symptoms improved at that time   -Cr down to 2.95 but rose to 3.83 and up to 4.36.  Resumed her outpatient torsemide dose of 40 mg daily but worsening UOP and BUN/Cr.    - s/p TDC placement by IR 11/04/22 and had 1 session of IHD 11/05/22.  She wants to hold off on more dialysis at this time.  Continue IV lasix 120 mg IV bid and follow UOP and BUN/CR but may need to resume IHD Monday if no improvement.   -She is interested in home therapies down the road if/when indicated.  She is a poor longterm HD candidate given her advanced age, multiple co-morbidities, and poor functional and nutritional status.   -normal renal artery duplex today, no evidence of RAS -daily labs, strict I/O -hold off on discharge for now as she may need to have outpatient HD arrangements made prior.    Hyperkalemia: Received medical treatment in the ER.  Improved with CRRT (currently off). On lasix + renal diet. K WNL now   Bradycardia: Seen by cardiology during admit. H/o PPM extraction secondary to bacteremia (Sep 07, 2022).  Off dopamine gtt, HR stable. Avoid beta blockers   Acute on chronic respiratory failure/respiratory distress with hypoxia: lasix as above. CXR 6/25 revealed improvement in opacities. On 3L [place] at baseline @ home -improved.   Sepsis due to possible pneumonia: On antibiotics per primary team.   Hyponatremia, mild: likely secondary to AKI and hypervolemia. WNL now    Anemia: transfuse for Hgb <7. ESA started 6/26, increased dose of aranesp to 100 mcg. 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  Will start binders, c/w renal diet  Irena Cords, MD [facility]

## 2022-11-08 DIAGNOSIS — J9602 Acute respiratory failure with hypercapnia: Secondary | ICD-10-CM | POA: Diagnosis not present

## 2022-11-08 DIAGNOSIS — J9601 Acute respiratory failure with hypoxia: Secondary | ICD-10-CM | POA: Diagnosis not present

## 2022-11-08 LAB — GLUCOSE, CAPILLARY
Glucose-Capillary: 125 mg/dL — ABNORMAL HIGH (ref 70–99)
Glucose-Capillary: 174 mg/dL — ABNORMAL HIGH (ref 70–99)
Glucose-Capillary: 203 mg/dL — ABNORMAL HIGH (ref 70–99)
Glucose-Capillary: 217 mg/dL — ABNORMAL HIGH (ref 70–99)

## 2022-11-08 LAB — RENAL FUNCTION PANEL
Albumin: 2.4 g/dL — ABNORMAL LOW (ref 3.5–5.0)
Anion gap: 11 (ref 5–15)
BUN: 56 mg/dL — ABNORMAL HIGH (ref 8–23)
CO2: 25 mmol/L (ref 22–32)
Calcium: 8.6 mg/dL — ABNORMAL LOW (ref 8.9–10.3)
Chloride: 100 mmol/L (ref 98–111)
Creatinine, Ser: 3.27 mg/dL — ABNORMAL HIGH (ref 0.44–1.00)
GFR, Estimated: 14 mL/min — ABNORMAL LOW (ref >60)
Glucose, Bld: 139 mg/dL — ABNORMAL HIGH (ref 70–99)
Phosphorus: 4.8 mg/dL — ABNORMAL HIGH (ref 2.5–4.6)
Potassium: 4.1 mmol/L (ref 3.5–5.1)
Sodium: 136 mmol/L (ref 135–145)

## 2022-11-08 LAB — MAGNESIUM: Magnesium: 1.9 mg/dL (ref 1.7–2.4)

## 2022-11-08 MED ORDER — FUROSEMIDE 10 MG/ML IJ SOLN
160.0000 mg | Freq: Two times a day (BID) | INTRAVENOUS | Status: DC
  Administered 2022-11-08 – 2022-11-09 (×3): 160 mg via INTRAVENOUS
  Filled 2022-11-08 (×6): qty 16

## 2022-11-08 MED ORDER — ARTIFICIAL TEARS OPHTHALMIC OINT
TOPICAL_OINTMENT | OPHTHALMIC | Status: DC | PRN
  Administered 2022-11-10: 1 via OPHTHALMIC
  Filled 2022-11-08: qty 3.5

## 2022-11-08 NOTE — Progress Notes (Signed)
Patient ID: Kayla [name], female   DOB: 1940/09/12, 82 y.o.   MRN: [id number] S: No new complaints. O:BP (!) 137/40 (BP Location: Left Arm)   Pulse (!) 51   Temp (!) 97.4 F (36.3 C) (Oral)   Resp 17   Ht 5\' 5"  (1.651 m)   Wt 102.8 kg Comment: bed  SpO2 97%   BMI 37.71 kg/m   Intake/Output Summary (Last 24 hours) at 11/08/2022 0948 Last data filed at 11/08/2022 0456 Gross per 24 hour  Intake 186 ml  Output 400 ml  Net -214 ml   Intake/Output: I/O last 3 completed shifts: In: 186 [IV Piggyback:186] Out: 650 [Urine:650]  Intake/Output this shift:  No intake/output data recorded. Weight change:  Gen: NAD CVS: bradycardic at 51 Resp:CTA Abd: +BS, soft, NT/ND Ext: 1+ pretibial edema  Recent Labs  Lab 11/02/22 0016 11/03/22 0013 11/04/22 0004 11/05/22 0002 11/06/22 0023 11/07/22 0010 11/08/22 0007  NA 136 136 133* 132* 132* 136 136  K 4.5 4.5 4.4 4.6 4.1 4.2 4.1  CL 99 99 96* 95* 95* 97* 100  CO2 26 25 25 25 25 27 25   GLUCOSE 230* 198* 195* 213* 192* 208* 139*  BUN 67* 73* 74* 77* 51* 56* 56*  CREATININE 3.37* 3.89* 4.28* 4.36* 3.07* 3.16* 3.27*  ALBUMIN 2.2* 2.3* 2.3* 2.2* 2.5* 2.3* 2.4*  CALCIUM 8.7* 8.6* 8.4* 8.4* 8.4* 8.5* 8.6*  PHOS 6.6* 6.4* 6.2* 5.7* 4.1 4.6 4.8*   Liver Function Tests: Recent Labs  Lab 11/06/22 0023 11/07/22 0010 11/08/22 0007  ALBUMIN 2.5* 2.3* 2.4*   No results for input(s): "LIPASE", "AMYLASE" in the last 168 hours. No results for input(s): "AMMONIA" in the last 168 hours. CBC: Recent Labs  Lab 11/02/22 0016 11/03/22 0013 11/04/22 0004 11/05/22 0002 11/07/22 0010  WBC 12.6* 13.5* 11.7* 12.0* 11.5*  HGB 7.7* 8.0* 7.3* 7.4* 7.6*  HCT 26.8* 27.6* 25.0* 25.1* 25.2*  MCV 100.0 99.6 100.8* 98.4 98.4  PLT 355 322 374 385 387   Cardiac Enzymes: No results for input(s): "CKTOTAL", "CKMB", "CKMBINDEX", "TROPONINI" in the last 168 hours. CBG: Recent Labs  Lab 11/07/22 0611 11/07/22 1122 11/07/22 1635 11/07/22 2052  11/08/22 0614  GLUCAP 138* 171* 182* 160* 125*    Iron Studies: No results for input(s): "IRON", "TIBC", "TRANSFERRIN", "FERRITIN" in the last 72 hours. Studies/Results: No results found.  amLODipine  10 mg Oral Daily   apixaban  2.5 mg Oral BID   atorvastatin  40 mg Oral Daily   bisacodyl  10 mg Rectal Daily   Or   bisacodyl  10 mg Oral Daily   Chlorhexidine Gluconate Cloth  6 each Topical Daily   darbepoetin (ARANESP) injection - NON-DIALYSIS  100 mcg Subcutaneous Q Wed-1800   feeding supplement (NEPRO CARB STEADY)  237 mL Oral BID BM   guaiFENesin  600 mg Oral BID   insulin aspart  0-6 Units Subcutaneous TID WC   isosorbide mononitrate  30 mg Oral Daily   levothyroxine  200 mcg Oral Q0600   lidocaine  1 patch Transdermal Q24H   multivitamin  1 tablet Oral QHS   mouth rinse  15 mL Mouth Rinse 4 times per day   sevelamer carbonate  800 mg Oral TID WC   sodium chloride flush  3 mL Intravenous Q12H    BMET    Component Value Date/Time   NA 136 11/08/2022 0007   NA 140 02/16/2022 1008   K 4.1 11/08/2022 0007   CL 100 11/08/2022  0007   CO2 25 11/08/2022 0007   GLUCOSE 139 (H) 11/08/2022 0007   BUN 56 (H) 11/08/2022 0007   BUN 47 (H) 02/16/2022 1008   CREATININE 3.27 (H) 11/08/2022 0007   CREATININE 2.10 (H) 10/07/2022 0939   CALCIUM 8.6 (L) 11/08/2022 0007   CALCIUM 9.9 06/04/2008 2240   GFRNONAA 14 (L) 11/08/2022 0007   GFRAA 45 (L) 02/16/2020 1124   CBC    Component Value Date/Time   WBC 11.5 (H) 11/07/2022 0010   RBC 2.56 (L) 11/07/2022 0010   HGB 7.6 (L) 11/07/2022 0010   HGB 11.3 01/30/2021 1154   HCT 25.2 (L) 11/07/2022 0010   HCT 35.9 01/30/2021 1154   PLT 387 11/07/2022 0010   PLT 389 01/30/2021 1154   MCV 98.4 11/07/2022 0010   MCV 91 01/30/2021 1154   MCH 29.7 11/07/2022 0010   MCHC 30.2 11/07/2022 0010   RDW 19.7 (H) 11/07/2022 0010   RDW 13.4 01/30/2021 1154   LYMPHSABS 1.4 10/25/2022 0730   MONOABS 1.3 (H) 10/25/2022 0730   EOSABS 0.1  10/25/2022 0730   BASOSABS 0.0 10/25/2022 0730     Assessment/Plan:   Acute kidney injury on CKD stage IV likely due to CHF exacerbation/fluid overload/bradycardia leading to reduced renal perfusion.  B/L Cr around 2, followed by Dr. Noah Charon as an outpatient. She had hyperkalemia, anasarca and did not respond with IV diuretics.  Also had features of uremic encephalopathy including tremor, AMS. -temp HD catheter placed on 6/23, started CRRT 6/23. CRRT off on 6/24 given access issues/alarms. RIJ temp line removed 6/26. Uremic symptoms improved at that time   -Cr down to 2.95 but rose to 3.83 and up to 4.36.  Resumed her outpatient torsemide dose of 40 mg daily but worsening UOP and BUN/Cr.    - s/p TDC placement by IR 11/04/22 and had 1 session of IHD 11/05/22 and tolerated it well with UF of 1 liter.  Cr improved from 4.36 to 3.07 but has slowly been climbing and is now up to 3.27.  UOP has dropped.  Will increase IV lasix dose to 160 mg and follow.  Follow UOP and BUN/CR but may need to resume IHD Monday if no improvement.  Will need renal navigator assistance if she needs to resume IHD and may be good fit for TCU.     -She is interested in home therapies down the road if/when indicated.  She is a poor longterm HD candidate given her advanced age, multiple co-morbidities, and poor functional and nutritional status.   -normal renal artery duplex today, no evidence of RAS -daily labs, strict I/O -hold off on discharge for now as she may need to have outpatient HD arrangements made prior.    Hyperkalemia: Received medical treatment in the ER.  Improved with CRRT (currently off). On lasix + renal diet. K WNL now   Bradycardia: Seen by cardiology during admit. H/o PPM extraction secondary to bacteremia (Sep 07, 2022).  Off dopamine gtt, HR stable. Avoid beta blockers   Acute on chronic respiratory failure/respiratory distress with hypoxia: lasix as above. CXR 6/25 revealed improvement in opacities. On  3L [place] at baseline @ home -improved.   Sepsis due to possible pneumonia: On antibiotics per primary team.   Hyponatremia, mild: likely secondary to AKI and hypervolemia. WNL now   Anemia: transfuse for Hgb <7. ESA started 6/26, increased dose of aranesp to 100 mcg. Feraheme x 1 dose 6/28   CKD MBD: PO4 climbing and now  on renvela, c/w renal diet    Irena Cords, MD [facility]

## 2022-11-08 NOTE — Progress Notes (Signed)
Mobility Specialist Progress Note   11/08/22 1139  Mobility  Activity Ambulated with assistance in hallway  Level of Assistance +2 (takes two people) (Chair Follow)  Assistive Device Front wheel walker  Distance Ambulated (ft) 88 ft (44+44)  Activity Response Tolerated well  Mobility Referral Yes  $Mobility charge 1 Mobility  Mobility Specialist Start Time (ACUTE ONLY) 1100  Mobility Specialist Stop Time (ACUTE ONLY) 1140  Mobility Specialist Time Calculation (min) (ACUTE ONLY) 40 min   Pre Mobility: 41 HR, 157/62 BP, 94% SpO2 on 3LO2 During Mobility: 63 HR, 154/46 BP, 91% SpO2 on 3LO2 Post Mobility: 48 HR, 147/59 BP, 99% SpO2 on 3LO2  Received in bed disinclined for mobility d/t fatigue but agreeable. Pt having decreased trunk strength and requiring minA to get EOB today. X1 seated rest break during hallway ambulation d/t dizziness and increased SOB but VSS. Returned back to room and placed in chair w/o fault and dizziness settled. Call bell in reach and chair alarm on. RN made aware of symptoms.    Frederico Hamman Mobility Specialist Please contact via SecureChat or  Rehab office at [phone]

## 2022-11-08 NOTE — Plan of Care (Signed)

## 2022-11-08 NOTE — Progress Notes (Signed)
PROGRESS NOTE  Kayla [name] XLK:[id number] DOB: 1940-11-14 DOA: 10/25/2022 PCP: Ailene Ravel, MD  Brief History   Patient is an 82 year old Caucasian female, significantly obese, with recent history of MRSA bacteremia/infective endocarditis for which patient was on antibiotics for a long time.  Other medical history includes coronary artery disease, carotid artery disease, chronic kidney disease stage IV, COPD, diabetes mellitus type 2, hypertension, hyperlipidemia, hypothyroidism and documented congestive heart failure.  Recent echocardiograms have revealed normal left ventricular ejection fraction, without mention of diastolic dysfunction.  Patient was admitted with pulmonary edema, acute kidney injury on chronic kidney disease stage IV with oliguria versus anuria, requiring CRRT for about 48 hours.  Improvement in renal function is noted.  Patient continues to make urine.  Hypercapnic respiratory failure was also noted on presentation.  Patient has been on home oxygen for but a year.   Patient was admitted to ICU, underwent CRRT, treated for respiratory failure and pulmonary edema.  TRH assumed care on 10/30/2022.    The patient has been continued on lasix 80 mg IV bid. Doppler of renal arteries was normal. Rib x-ray discovered that aminimally displaced posterolateral left seventh rib fracture is the likely cause of the patient's left upper quadrant/left lateral chest wall pain. Over the course of the last 3 days that patient's renal function has initially improved from 3.18 to 2.95 and then worsened (from 2.95 to 3.38 to  3.89. Nephrology is aware. It has not been possible to follow the patient's volume status carefully, as nursing has not been documenting output from purewick. (Patient states that it doesn't work, and urine goes everywhere). IV fluids had been stopped on 11/02/2022. It seems that the patient's intake of fluids is very low. She has been encouraged to drink. Nephrology has changed  the patient's diuretic from laisx 80 mg IV bid to torsemide 40 mg daily which was her home dose of diuretic. Monitor carefully.   Palliative care has been consulted. They have confirmed that the patient wishes to be a DNR, but wants everything otherwise to be done to keep her alive. This would include HD. Due to creatinine continued upward course, nephrology is considering HD.  11/08/2022: Patient seen alongside patient's wife.  Patient reports dryness of the eyes.  Patient did not undergo dialysis today.  Nephrology team is directing care.  IV Lasix has been increased to 160 Mg twice daily by the nephrology team.  Subjective  -No new complaints.  No shortness of breath.  No chest pain.  Patient has right chest TDC. Objective   Vitals:  Vitals:   11/08/22 0428 11/08/22 0728  BP: (!) 137/40 (!) 137/40  Pulse: (!) 44 (!) 51  Resp: 17 17  Temp: 97.6 F (36.4 C) (!) 97.4 F (36.3 C)  SpO2: 97%     Exam:  Constitutional:  The patient is awake, alert, and oriented x 3. No acute distress. Respiratory:  Decreased air entry globally. Cardiovascular:  S1-S2.   Abdomen:  Abdomen is obese, soft and nontender.   Musculoskeletal:  Bilateral lower extremity edema.  Neurologic:  Awake and alert.  Moves all extremities.   I have personally reviewed the following:   Today's Data   Vitals:   11/08/22 0428 11/08/22 0728  BP: (!) 137/40 (!) 137/40  Pulse: (!) 44 (!) 51  Resp: 17 17  Temp: 97.6 F (36.4 C) (!) 97.4 F (36.3 C)  SpO2: 97%      Lab Data      Latest Ref  Rng & Units 11/07/2022   12:10 AM 11/05/2022   12:02 AM 11/04/2022   12:04 AM  CBC  WBC 4.0 - 10.5 K/uL 11.5  12.0  11.7   Hemoglobin 12.0 - 15.0 g/dL 7.6  7.4  7.3   Hematocrit 36.0 - 46.0 % 25.2  25.1  25.0   Platelets 150 - 400 K/uL 387  385  374       Latest Ref Rng & Units 11/08/2022   12:07 AM 11/07/2022   12:10 AM 11/06/2022   12:23 AM  BMP  Glucose 70 - 99 mg/dL 161  096  045   BUN 8 - 23 mg/dL 56  56  51    Creatinine 0.44 - 1.00 mg/dL 4.09  8.11  9.14   Sodium 135 - 145 mmol/L 136  136  132   Potassium 3.5 - 5.1 mmol/L 4.1  4.2  4.1   Chloride 98 - 111 mmol/L 100  97  95   CO2 22 - 32 mmol/L 25  27  25    Calcium 8.9 - 10.3 mg/dL 8.6  8.5  8.4     Micro Data   Results for orders placed or performed during the hospital encounter of 10/25/22  Culture, blood (routine x 2)     Status: None   Collection Time: 10/25/22  8:24 AM   Specimen: BLOOD LEFT ARM  Result Value Ref Range Status   Specimen Description BLOOD LEFT ARM  Final   Special Requests   Final    BOTTLES DRAWN AEROBIC AND ANAEROBIC Blood Culture adequate volume   Culture   Final    NO GROWTH 5 DAYS Performed at [facility], [address] [place], [place] [address]    Report Status 10/30/2022 FINAL  Final  Culture, blood (routine x 2)     Status: None   Collection Time: 10/25/22  8:26 AM   Specimen: BLOOD  Result Value Ref Range Status   Specimen Description BLOOD SITE NOT SPECIFIED  Final   Special Requests   Final    BOTTLES DRAWN AEROBIC AND ANAEROBIC Blood Culture results may not be optimal due to an inadequate volume of blood received in culture bottles   Culture   Final    NO GROWTH 5 DAYS Performed at [facility], [address], [place], [place] [address]    Report Status 10/30/2022 FINAL  Final  MRSA Next Gen by PCR, Nasal     Status: None   Collection Time: 10/25/22 11:46 AM   Specimen: Nasal Mucosa; Nasal Swab  Result Value Ref Range Status   MRSA by PCR Next Gen NOT DETECTED NOT DETECTED Final    Comment: (NOTE) The GeneXpert MRSA Assay (FDA approved for NASAL specimens only), is one component of a comprehensive MRSA colonization surveillance program. It is not intended to diagnose MRSA infection nor to guide or monitor treatment for MRSA infections. Test performance is not FDA approved in patients less than 79 years old. Performed at [facility], [address] [place],  [place] [address]     Scheduled Meds:  amLODipine  10 mg Oral Daily   apixaban  2.5 mg Oral BID   atorvastatin  40 mg Oral Daily   bisacodyl  10 mg Rectal Daily   Or   bisacodyl  10 mg Oral Daily   Chlorhexidine Gluconate Cloth  6 each Topical Daily   darbepoetin (ARANESP) injection - NON-DIALYSIS  100 mcg Subcutaneous Q Wed-1800  feeding supplement (NEPRO CARB STEADY)  237 mL Oral BID BM   guaiFENesin  600 mg Oral BID   insulin aspart  0-6 Units Subcutaneous TID WC   isosorbide mononitrate  30 mg Oral Daily   levothyroxine  200 mcg Oral Q0600   lidocaine  1 patch Transdermal Q24H   multivitamin  1 tablet Oral QHS   mouth rinse  15 mL Mouth Rinse 4 times per day   sevelamer carbonate  800 mg Oral TID WC   sodium chloride flush  3 mL Intravenous Q12H   Continuous Infusions:  sodium chloride Stopped (10/26/22 1934)   furosemide      Principal Problem:   Acute respiratory failure with hypoxia and hypercapnia (HCC) Active Problems:   AKI (acute kidney injury) (HCC)   Junctional bradycardia   Hyperkalemia   LOS: 14 days    A & P  Assessment & Plan:   Principal Problem:   Acute respiratory failure with hypoxia and hypercapnia (HCC) Active Problems:   AKI (acute kidney injury) (HCC)   Junctional bradycardia   Hyperkalemia   Acute kidney injury on chronic kidney disease stage IV/pulmonary edema: -AKI is worsening. -Serum creatinine has increased to 4.28. It has been increasing for the last 3 days. -Nephrology has changed diuresis from lasix 80 mg bid to torsemide 40 mg daily, but creatinine continues to rise. -Patient was on CRRT for about 48 hours. She states that she would agree to HD. Nephrology is now considering it. -The patient has had poor PO intake of fluids. She has been encouraged to drink, but she states that she doesn't like to drink, because she fears wetting the bed.  -Strict I's and O's. -Continue to monitor input and input. -Avoid nephrotoxins. -Dose all  medications assuming GFR of less than 10 mL/min. -Keep MAP greater than 65 mmHg. 11/08/2022: Patient seen.  Worsening renal function.  Nephrology input is appreciated.  No hemodialysis today.  IV Lasix increased to 160 Mg twice daily.  Possible renal replacement therapy tomorrow.   Acute on chronic hypoxic/hypercapnic respiratory failure: -Patient has been on oxygen for about 1 year (2 L/min via nasal cannula). -Etiology is likely multifactorial. -Pt complains of increasing work of breathing. Likely due to worsening renal statu -Discussed need to comply with CPAP management and, treatment of OSA/likely undiagnosed OHS. 11/05/2022: Stable.   COPD: -Seems compensated. -Continue current regimen.   Left lateral chest wall pain: -X-ray of the ribs revealed minimally displaced posterolateral left seventh rib fracture.  -Adequate pain control. -Lidocaine patch to left lateral rib cage area. -Supportive management.   Paroxysmal atrial fibrillation: -Controlled heart rate. -Patient is on apixaban 2.5 Mg p.o. twice daily.   Anemia of chronic kidney disease: -Patient has received IV iron. -Patient is on Aranesp every 2 weeks.   Hypertension: -Continue to optimize. -Goal blood pressure should be less than 130/80 mmHg.   Diabetes mellitus:  -Continue sliding scale insulin coverage. -Continue to monitor blood sugar and adjust accordingly.   Hyperlipidemia: -Continue atorvastatin.   Possible community acquired pneumonia: -Findings/events may be suggestive of pulmonary edema. -IV Zosyn has been discontinued.     DVT prophylaxis: Apixaban. Code Status: DO NOT RESUSCITATE. Family Communication: Husband. Disposition Plan: This will depend on hospital course.     Consultants:  Nephrology.   Procedures:  Patient was on CRRT.   Antimicrobials:  IV Zosyn has been discontinued.    Berton Mount, MD. [facility] Direct contact: see [email or web address]  7PM-7AM contact night  coverage as  above 11/04/2022, 5:43 PM  LOS: 9 days

## 2022-11-09 ENCOUNTER — Encounter (HOSPITAL_COMMUNITY): Payer: Self-pay

## 2022-11-09 DIAGNOSIS — J9602 Acute respiratory failure with hypercapnia: Secondary | ICD-10-CM | POA: Diagnosis not present

## 2022-11-09 DIAGNOSIS — J9601 Acute respiratory failure with hypoxia: Secondary | ICD-10-CM | POA: Diagnosis not present

## 2022-11-09 LAB — RENAL FUNCTION PANEL
Albumin: 2.4 g/dL — ABNORMAL LOW (ref 3.5–5.0)
Anion gap: 11 (ref 5–15)
BUN: 57 mg/dL — ABNORMAL HIGH (ref 8–23)
CO2: 27 mmol/L (ref 22–32)
Calcium: 8.7 mg/dL — ABNORMAL LOW (ref 8.9–10.3)
Chloride: 96 mmol/L — ABNORMAL LOW (ref 98–111)
Creatinine, Ser: 3.27 mg/dL — ABNORMAL HIGH (ref 0.44–1.00)
GFR, Estimated: 14 mL/min — ABNORMAL LOW (ref >60)
Glucose, Bld: 213 mg/dL — ABNORMAL HIGH (ref 70–99)
Phosphorus: 4.7 mg/dL — ABNORMAL HIGH (ref 2.5–4.6)
Potassium: 3.8 mmol/L (ref 3.5–5.1)
Sodium: 134 mmol/L — ABNORMAL LOW (ref 135–145)

## 2022-11-09 LAB — CBC
HCT: 28.4 % — ABNORMAL LOW (ref 36.0–46.0)
Hemoglobin: 8.5 g/dL — ABNORMAL LOW (ref 12.0–15.0)
MCH: 29.9 pg (ref 26.0–34.0)
MCHC: 29.9 g/dL — ABNORMAL LOW (ref 30.0–36.0)
MCV: 100 fL (ref 80.0–100.0)
Platelets: 400 10*3/uL (ref 150–400)
RBC: 2.84 MIL/uL — ABNORMAL LOW (ref 3.87–5.11)
RDW: 20.1 % — ABNORMAL HIGH (ref 11.5–15.5)
WBC: 10.3 10*3/uL (ref 4.0–10.5)
nRBC: 0.3 % — ABNORMAL HIGH (ref 0.0–0.2)

## 2022-11-09 LAB — GLUCOSE, CAPILLARY
Glucose-Capillary: 130 mg/dL — ABNORMAL HIGH (ref 70–99)
Glucose-Capillary: 209 mg/dL — ABNORMAL HIGH (ref 70–99)
Glucose-Capillary: 181 mg/dL — ABNORMAL HIGH (ref 70–99)
Glucose-Capillary: 159 mg/dL — ABNORMAL HIGH (ref 70–99)

## 2022-11-09 LAB — MAGNESIUM: Magnesium: 1.9 mg/dL (ref 1.7–2.4)

## 2022-11-09 MED ORDER — METOLAZONE 2.5 MG PO TABS
5.0000 mg | ORAL_TABLET | Freq: Once | ORAL | Status: AC
  Administered 2022-11-09: 5 mg via ORAL
  Filled 2022-11-09: qty 2

## 2022-11-09 NOTE — Progress Notes (Signed)
Palliative - Chart check No palliative needs identified.  Barrie Folk MS Ed.S, RN Palliative Medicine Team  Team Phone: [phone]

## 2022-11-09 NOTE — Progress Notes (Signed)
PROGRESS NOTE  Kayla [name] UJW:[id number] DOB: Sep 15, 1940 DOA: 10/25/2022 PCP: Ailene Ravel, MD  Brief History   Patient is an 82 year old Caucasian female, significantly obese, with recent history of MRSA bacteremia/infective endocarditis for which patient was on antibiotics for a long time.  Other medical history includes coronary artery disease, carotid artery disease, chronic kidney disease stage IV, COPD, diabetes mellitus type 2, hypertension, hyperlipidemia, hypothyroidism and documented congestive heart failure.  Recent echocardiograms have revealed normal left ventricular ejection fraction, without mention of diastolic dysfunction.  Patient was admitted with pulmonary edema, acute kidney injury on chronic kidney disease stage IV with oliguria versus anuria, requiring CRRT for about 48 hours.  Improvement in renal function is noted.  Patient continues to make urine.  Hypercapnic respiratory failure was also noted on presentation.  Patient has been on home oxygen for but a year.   Patient was admitted to ICU, underwent CRRT, treated for respiratory failure and pulmonary edema.  TRH assumed care on 10/30/2022.    The patient has been continued on lasix 80 mg IV bid. Doppler of renal arteries was normal. Rib x-ray discovered that aminimally displaced posterolateral left seventh rib fracture is the likely cause of the patient's left upper quadrant/left lateral chest wall pain. Over the course of the last 3 days that patient's renal function has initially improved from 3.18 to 2.95 and then worsened (from 2.95 to 3.38 to  3.89. Nephrology is aware. It has not been possible to follow the patient's volume status carefully, as nursing has not been documenting output from purewick. (Patient states that it doesn't work, and urine goes everywhere). IV fluids had been stopped on 11/02/2022. It seems that the patient's intake of fluids is very low. She has been encouraged to drink. Nephrology has changed  the patient's diuretic from laisx 80 mg IV bid to torsemide 40 mg daily which was her home dose of diuretic. Monitor carefully.   Palliative care has been consulted. They have confirmed that the patient wishes to be a DNR, but wants everything otherwise to be done to keep her alive. This would include HD. Due to creatinine continued upward course, nephrology is considering HD.  11/08/2022: Patient seen alongside patient's wife.  Patient reports dryness of the eyes.  Patient did not undergo dialysis today.  Nephrology team is directing care.  IV Lasix has been increased to 160 Mg twice daily by the nephrology team.  11/09/2022: Patient seen alongside patient's husband.  Nephrology input is appreciated.  Likely, I's and O's are not accurate.  Patient remains on IV Lasix 160 mg twice daily.  Continue to assess need for renal replacement therapy daily.  Patient will be discharged once cleared for discharge by the nephrology team.  Subjective  -No new complaints.  No shortness of breath.  No chest pain.  Patient has right chest TDC. Objective   Vitals:  Vitals:   11/09/22 0715 11/09/22 0800  BP: (!) 132/54   Pulse: (!) 51   Resp: 15   Temp: (!) 97.5 F (36.4 C)   SpO2: 93% 93%    Exam:  Constitutional:  The patient is awake, alert, and oriented x 3. No acute distress. Respiratory:  Decreased air entry globally. Cardiovascular:  S1-S2.   Abdomen:  Abdomen is obese, soft and nontender.   Musculoskeletal:  Bilateral lower extremity edema.  Neurologic:  Awake and alert.  Moves all extremities.   I have personally reviewed the following:   Today's Data   Vitals:  11/09/22 0715 11/09/22 0800  BP: (!) 132/54   Pulse: (!) 51   Resp: 15   Temp: (!) 97.5 F (36.4 C)   SpO2: 93% 93%     Lab Data      Latest Ref Rng & Units 11/09/2022   12:18 AM 11/07/2022   12:10 AM 11/05/2022   12:02 AM  CBC  WBC 4.0 - 10.5 K/uL 10.3  11.5  12.0   Hemoglobin 12.0 - 15.0 g/dL 8.5  7.6  7.4    Hematocrit 36.0 - 46.0 % 28.4  25.2  25.1   Platelets 150 - 400 K/uL 400  387  385       Latest Ref Rng & Units 11/09/2022   12:18 AM 11/08/2022   12:07 AM 11/07/2022   12:10 AM  BMP  Glucose 70 - 99 mg/dL 846  962  952   BUN 8 - 23 mg/dL 57  56  56   Creatinine 0.44 - 1.00 mg/dL 8.41  3.24  4.01   Sodium 135 - 145 mmol/L 134  136  136   Potassium 3.5 - 5.1 mmol/L 3.8  4.1  4.2   Chloride 98 - 111 mmol/L 96  100  97   CO2 22 - 32 mmol/L 27  25  27    Calcium 8.9 - 10.3 mg/dL 8.7  8.6  8.5     Micro Data   Results for orders placed or performed during the hospital encounter of 10/25/22  Culture, blood (routine x 2)     Status: None   Collection Time: 10/25/22  8:24 AM   Specimen: BLOOD LEFT ARM  Result Value Ref Range Status   Specimen Description BLOOD LEFT ARM  Final   Special Requests   Final    BOTTLES DRAWN AEROBIC AND ANAEROBIC Blood Culture adequate volume   Culture   Final    NO GROWTH 5 DAYS Performed at [facility], [address] [place], [place] [address]    Report Status 10/30/2022 FINAL  Final  Culture, blood (routine x 2)     Status: None   Collection Time: 10/25/22  8:26 AM   Specimen: BLOOD  Result Value Ref Range Status   Specimen Description BLOOD SITE NOT SPECIFIED  Final   Special Requests   Final    BOTTLES DRAWN AEROBIC AND ANAEROBIC Blood Culture results may not be optimal due to an inadequate volume of blood received in culture bottles   Culture   Final    NO GROWTH 5 DAYS Performed at [facility], [address] [place], [place] [address]    Report Status 10/30/2022 FINAL  Final  MRSA Next Gen by PCR, Nasal     Status: None   Collection Time: 10/25/22 11:46 AM   Specimen: Nasal Mucosa; Nasal Swab  Result Value Ref Range Status   MRSA by PCR Next Gen NOT DETECTED NOT DETECTED Final    Comment: (NOTE) The GeneXpert MRSA Assay (FDA approved for NASAL specimens only), is one component of a comprehensive MRSA colonization  surveillance program. It is not intended to diagnose MRSA infection nor to guide or monitor treatment for MRSA infections. Test performance is not FDA approved in patients less than 7 years old. Performed at [facility], [address], [place], [place] [address]     Scheduled Meds:  amLODipine  10 mg Oral Daily   apixaban  2.5 mg Oral BID   atorvastatin  40 mg Oral Daily  bisacodyl  10 mg Rectal Daily   Or   bisacodyl  10 mg Oral Daily   Chlorhexidine Gluconate Cloth  6 each Topical Daily   darbepoetin (ARANESP) injection - NON-DIALYSIS  100 mcg Subcutaneous Q Wed-1800   feeding supplement (NEPRO CARB STEADY)  237 mL Oral BID BM   guaiFENesin  600 mg Oral BID   insulin aspart  0-6 Units Subcutaneous TID WC   isosorbide mononitrate  30 mg Oral Daily   levothyroxine  200 mcg Oral Q0600   lidocaine  1 patch Transdermal Q24H   multivitamin  1 tablet Oral QHS   mouth rinse  15 mL Mouth Rinse 4 times per day   sevelamer carbonate  800 mg Oral TID WC   sodium chloride flush  3 mL Intravenous Q12H   Continuous Infusions:  sodium chloride Stopped (10/26/22 1934)   furosemide 160 mg (11/09/22 1724)    Principal Problem:   Acute respiratory failure with hypoxia and hypercapnia (HCC) Active Problems:   AKI (acute kidney injury) (HCC)   Junctional bradycardia   Hyperkalemia   LOS: 15 days    A & P  Assessment & Plan:   Principal Problem:   Acute respiratory failure with hypoxia and hypercapnia (HCC) Active Problems:   AKI (acute kidney injury) (HCC)   Junctional bradycardia   Hyperkalemia   Acute kidney injury on chronic kidney disease stage IV/pulmonary edema: -AKI is worsening. -Serum creatinine has increased to 4.28. It has been increasing for the last 3 days. -Nephrology has changed diuresis from lasix 80 mg bid to torsemide 40 mg daily, but creatinine continues to rise. -Patient was on CRRT for about 48 hours. She states that she would agree to HD.  Nephrology is now considering it. -The patient has had poor PO intake of fluids. She has been encouraged to drink, but she states that she doesn't like to drink, because she fears wetting the bed.  -Strict I's and O's. -Continue to monitor input and input. -Avoid nephrotoxins. -Dose all medications assuming GFR of less than 10 mL/min. -Keep MAP greater than 65 mmHg. 11/08/2022: Patient seen.  Worsening renal function.  Nephrology input is appreciated.  No hemodialysis today.  IV Lasix increased to 160 Mg twice daily.  Possible renal replacement therapy tomorrow. 11/09/2022: Serum creatinine remains the same.  Input and output documentation is likely not accurate.  Patient is on IV Lasix 160 Mg twice daily.  Acute on chronic hypoxic/hypercapnic respiratory failure: -Patient has been on oxygen for about 1 year (2 L/min via nasal cannula). -Etiology is likely multifactorial. -Pt complains of increasing work of breathing. Likely due to worsening renal statu -Discussed need to comply with CPAP management and, treatment of OSA/likely undiagnosed OHS. 11/05/2022: Stable.   COPD: -Seems compensated. -Continue current regimen.   Left lateral chest wall pain: -X-ray of the ribs revealed minimally displaced posterolateral left seventh rib fracture.  -Adequate pain control. -Lidocaine patch to left lateral rib cage area. -Supportive management.   Paroxysmal atrial fibrillation: -Controlled heart rate. -Patient is on apixaban 2.5 Mg p.o. twice daily.   Anemia of chronic kidney disease: -Patient has received IV iron. -Patient is on Aranesp every 2 weeks.   Hypertension: -Continue to optimize. -Goal blood pressure should be less than 130/80 mmHg.   Diabetes mellitus:  -Continue sliding scale insulin coverage. -Continue to monitor blood sugar and adjust accordingly.   Hyperlipidemia: -Continue atorvastatin.   Possible community acquired pneumonia: -Findings/events may be suggestive of  pulmonary edema. -IV Zosyn  has been discontinued.     DVT prophylaxis: Apixaban. Code Status: DO NOT RESUSCITATE. Family Communication: Husband. Disposition Plan: This will depend on hospital course.     Consultants:  Nephrology.   Procedures:  Patient was on CRRT.   Antimicrobials:  IV Zosyn has been discontinued.    Berton Mount, MD. [facility] Direct contact: see [email or web address]  7PM-7AM contact night coverage as above 11/04/2022, 5:43 PM  LOS: 9 days

## 2022-11-09 NOTE — TOC Progression Note (Signed)
Transition of Care [facility]) - Progression Note    Patient Details  Name: Kayla [name] MRN: [id number] Date of Birth: 1940/07/26  Transition of [facility]) CM/SW Contact  Eduard Roux, [place] Phone Number: 11/09/2022, 2:04 PM  Clinical Narrative:     TOC continues to follow and will assist with discharge planning. Once stable SNF preference is [facility], if no dialysis is needed, or [facility].   Antony Blackbird, MSW, LCSW Clinical Social Worker    Expected Discharge Plan: Skilled Nursing Facility Barriers to Discharge: Continued Medical Work up  Expected Discharge Plan and Services   Discharge Planning Services: CM Consult Post Acute Care Choice: Home Health Living arrangements for the past 2 months: Single Family Home                 DME Arranged: Wheelchair manual DME Agency: [employer] Date DME Agency Contacted: 11/02/22 Time DME Agency Contacted: [phone] Representative spoke with at DME Agency: Zack HH Arranged: RN, PT HH Agency: [facility] Date [facility] Agency Contacted: 10/29/22 Time HH Agency Contacted: 1525 Representative spoke with at [facility] Agency: Haywood Lasso   Social Determinants of Health (SDOH) Interventions SDOH Screenings   Food Insecurity: No Food Insecurity (11/04/2022)  Housing: Patient Declined (11/04/2022)  Transportation Needs: No Transportation Needs (11/04/2022)  Utilities: Not At Risk (11/04/2022)  Alcohol Screen: Low Risk  (01/31/2021)  Depression (PHQ2-9): Low Risk  (10/15/2022)  Financial Resource Strain: Low Risk  (01/31/2021)  Physical Activity: Insufficiently Active (09/30/2021)  Social Connections: Socially Integrated (09/30/2021)  Stress: No Stress Concern Present (09/30/2021)  Tobacco Use: Medium Risk (11/04/2022)    Readmission Risk Interventions    10/01/2022    3:52 PM 01/22/2022    2:43 PM 07/23/2021   12:10 PM  Readmission Risk Prevention Plan  Transportation Screening Complete Complete Complete  PCP or Specialist Appt within 3-5 Days  Complete Complete   HRI or Home Care Consult  Complete   Social Work Consult for Recovery Care Planning/Counseling Complete Complete   Palliative Care Screening Not Applicable Not Applicable   Medication Review Oceanographer) Referral to Pharmacy Complete Complete  HRI or Home Care Consult   Complete  SW Recovery Care/Counseling Consult   Complete  Palliative Care Screening   Not Applicable  Skilled Nursing Facility   Complete

## 2022-11-09 NOTE — Progress Notes (Signed)
   11/09/22 0000  BiPAP/CPAP/SIPAP  BiPAP/CPAP/SIPAP Pt Type Adult  BiPAP/CPAP/SIPAP Resmed (Home unit)  Mask Type Nasal pillows (Home)  Flow Rate 4 lpm (bleed in)  Patient Home Equipment Yes  BiPAP/CPAP /SiPAP Vitals  Pulse Rate (!) 50  Resp 16  SpO2 98 %  MEWS Score/Color  MEWS Score 1  MEWS Score Color Green

## 2022-11-09 NOTE — Progress Notes (Signed)
Physical Therapy Treatment Patient Details Name: Kayla [name] MRN: [id number] DOB: 1940/05/13 Today's Date: 11/09/2022   History of Present Illness Kayla [name] is 82 year old female brought to the ED 10/25/22 for SoB and AMS. Found to be in respiratory distress, HR 30s. ABG pH 7.1 with high pCO2 and low bicarbonate, Alo noted to have AKI. 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 Kayla [name] progressing towards goals. D/c recommendations remain appropriate. Acute Kayla [name] to cont to follow.     Assistance Recommended at Discharge Frequent or constant Supervision/Assistance  If plan is discharge home, recommend the following:  Can travel by private vehicle    A little help with walking and/or transfers;A little help with bathing/dressing/bathroom;Assistance with cooking/housework;Direct supervision/assist for medications management;Direct supervision/assist for financial management;Assist for transportation;Help with stairs or ramp for entrance   No  Equipment Recommendations  Rolling walker (2 wheels);Wheelchair cushion (measurements Kayla [name]);Wheelchair (measurements Kayla [name])    Recommendations for Other Services       Precautions / Restrictions Precautions Precautions: Fall Restrictions Weight Bearing Restrictions: No     Mobility  Bed Mobility               General bed mobility comments: received sitting in chair    Transfers Overall transfer level: Needs assistance Equipment used: Rolling  walker (2 wheels) Transfers: Sit to/from Stand Sit to Stand: Min guard           General transfer comment: verbal cues for safe hand placement    Ambulation/Gait Ambulation/Gait assistance: Min guard Gait Distance (Feet): 60 Feet (x2) Assistive device: Rolling walker (2 wheels) Gait Pattern/deviations: Decreased stride length, Trunk flexed, Step-through pattern Gait velocity: Decreased Gait velocity interpretation: <1.31 ft/sec, indicative of household ambulator   General Gait Details: Kayla [name] fatigues quickly, 4/4 DOE, SpO2 >90% on 3LO2 via [place], 1 seated rest break   Stairs             Wheelchair Mobility     Tilt Bed    Modified Rankin (Stroke Patients Only)       Balance Overall balance assessment: Needs assistance Sitting-balance support: Feet supported, No upper extremity supported Sitting balance-Leahy Scale: Fair Sitting balance - Comments: Able to sit edge of recliner without UE support   Standing balance support: Reliant on assistive device for balance Standing balance-Leahy Scale: Poor Standing balance comment: Reliant on RW for UE support during functional mobility                            Cognition Arousal/Alertness: Awake/alert Behavior During Therapy: WFL for tasks assessed/performed Overall Cognitive Status: Impaired/Different from baseline Area of Impairment: Attention, Following commands, Safety/judgement, Awareness                   Current Attention Level: Focused Memory: Decreased short-term memory Following Commands: Follows one step commands consistently Safety/Judgement: Decreased awareness of safety, Decreased awareness of deficits Awareness: Emergent Problem Solving: Requires verbal cues, Requires tactile cues General Comments: Increased time for processing one step commands. easily irritated  Exercises      General Comments General comments (skin integrity, edema, etc.): Kayla [name] with noted edema t/o bilat  LEs and UEs, SPO2 90% on 3lO2 while amb, SpO2 at 98% at rest on 3LO2 via [place]      Pertinent Vitals/Pain Pain Assessment Pain Assessment: 0-10 Faces Pain Scale: Hurts little more Pain Location: Ribs Pain Descriptors / Indicators: Tender Pain Intervention(s): Monitored during session    Home Living                          Prior Function            Kayla [name] Goals (current goals can now be found in the care plan section) Acute Rehab Kayla [name] Goals Kayla [name] Goal Formulation: With patient/family Time For Goal Achievement: 11/13/22 Potential to Achieve Goals: Fair Progress towards Kayla [name] goals: Progressing toward goals    Frequency    Min 1X/week      Kayla [name] Plan Current plan remains appropriate    Co-evaluation              AM-PAC Kayla [name] "6 Clicks" Mobility   Outcome Measure  Help needed turning from your back to your side while in a flat bed without using bedrails?: A Little Help needed moving from lying on your back to sitting on the side of a flat bed without using bedrails?: A Little Help needed moving to and from a bed to a chair (including a wheelchair)?: A Little Help needed standing up from a chair using your arms (e.g., wheelchair or bedside chair)?: A Little Help needed to walk in hospital room?: A Little Help needed climbing 3-5 steps with a railing? : A Lot 6 Click Score: 17    End of Session Equipment Utilized During Treatment: Gait belt Activity Tolerance: Patient limited by fatigue Patient left: with call bell/phone within reach;with family/visitor present;in chair Nurse Communication: Mobility status Kayla [name] Visit Diagnosis: Other abnormalities of gait and mobility (R26.89);Repeated falls (R29.6);History of falling (Z91.81);Muscle weakness (generalized) (M62.81);Dizziness and giddiness (R42) Pain - part of body:  (Ribs)     Time: 0981-1914 Kayla [name] Time Calculation (min) (ACUTE ONLY): 26 min  Charges:    $Gait Training: 23-37 mins Kayla [name] General Charges $$ ACUTE Kayla [name]  VISIT: 1 Visit                     Kayla [name], Kayla [name], Kayla [name] Acute Rehabilitation Services Secure chat preferred Office #: [phone]    Kayla [name] 11/09/2022, 12:28 PM

## 2022-11-09 NOTE — Progress Notes (Signed)
Patient ID: Kayla [name], female   DOB: 1940-11-12, 82 y.o.   MRN: [id number] S: No new complaints.  Husband bedside.  Doesn't like restricted diet but no dysgeusia or confusion.    O:BP (!) 132/54 (BP Location: Left Arm)   Pulse (!) 51   Temp (!) 97.5 F (36.4 C) (Oral)   Resp 15   Ht 5\' 5"  (1.651 m)   Wt 102.8 kg Comment: bed  SpO2 93%   BMI 37.71 kg/m   Intake/Output Summary (Last 24 hours) at 11/09/2022 0738 Last data filed at 11/09/2022 0325 Gross per 24 hour  Intake 66 ml  Output 400 ml  Net -334 ml    Intake/Output: Yesterday 0.07L/0.4L  Intake/Output this shift:  No intake/output data recorded. Weight change:  Gen: NAD CV:  no rub Resp: on O2, a few wheezes, no rales; normal WOB at rest Abd: +BS, soft, NT/ND Ext: 1+ pretibial edema   Recent Labs  Lab 11/03/22 0013 11/04/22 0004 11/05/22 0002 11/06/22 0023 11/07/22 0010 11/08/22 0007 11/09/22 0018  NA 136 133* 132* 132* 136 136 134*  K 4.5 4.4 4.6 4.1 4.2 4.1 3.8  CL 99 96* 95* 95* 97* 100 96*  CO2 25 25 25 25 27 25 27   GLUCOSE 198* 195* 213* 192* 208* 139* 213*  BUN 73* 74* 77* 51* 56* 56* 57*  CREATININE 3.89* 4.28* 4.36* 3.07* 3.16* 3.27* 3.27*  ALBUMIN 2.3* 2.3* 2.2* 2.5* 2.3* 2.4* 2.4*  CALCIUM 8.6* 8.4* 8.4* 8.4* 8.5* 8.6* 8.7*  PHOS 6.4* 6.2* 5.7* 4.1 4.6 4.8* 4.7*    Liver Function Tests: Recent Labs  Lab 11/07/22 0010 11/08/22 0007 11/09/22 0018  ALBUMIN 2.3* 2.4* 2.4*    No results for input(s): "LIPASE", "AMYLASE" in the last 168 hours. No results for input(s): "AMMONIA" in the last 168 hours. CBC: Recent Labs  Lab 11/03/22 0013 11/04/22 0004 11/05/22 0002 11/07/22 0010 11/09/22 0018  WBC 13.5* 11.7* 12.0* 11.5* 10.3  HGB 8.0* 7.3* 7.4* 7.6* 8.5*  HCT 27.6* 25.0* 25.1* 25.2* 28.4*  MCV 99.6 100.8* 98.4 98.4 100.0  PLT 322 374 385 387 400    Cardiac Enzymes: No results for input(s): "CKTOTAL", "CKMB", "CKMBINDEX", "TROPONINI" in the last 168 hours. CBG: Recent Labs   Lab 11/08/22 0614 11/08/22 1156 11/08/22 1631 11/08/22 2129 11/09/22 0607  GLUCAP 125* 174* 203* 217* 130*     Iron Studies: No results for input(s): "IRON", "TIBC", "TRANSFERRIN", "FERRITIN" in the last 72 hours. Studies/Results: No results found.  amLODipine  10 mg Oral Daily   apixaban  2.5 mg Oral BID   atorvastatin  40 mg Oral Daily   bisacodyl  10 mg Rectal Daily   Or   bisacodyl  10 mg Oral Daily   Chlorhexidine Gluconate Cloth  6 each Topical Daily   darbepoetin (ARANESP) injection - NON-DIALYSIS  100 mcg Subcutaneous Q Wed-1800   feeding supplement (NEPRO CARB STEADY)  237 mL Oral BID BM   guaiFENesin  600 mg Oral BID   insulin aspart  0-6 Units Subcutaneous TID WC   isosorbide mononitrate  30 mg Oral Daily   levothyroxine  200 mcg Oral Q0600   lidocaine  1 patch Transdermal Q24H   multivitamin  1 tablet Oral QHS   mouth rinse  15 mL Mouth Rinse 4 times per day   sevelamer carbonate  800 mg Oral TID WC   sodium chloride flush  3 mL Intravenous Q12H    BMET    Component Value Date/Time  NA 134 (L) 11/09/2022 0018   NA 140 02/16/2022 1008   K 3.8 11/09/2022 0018   CL 96 (L) 11/09/2022 0018   CO2 27 11/09/2022 0018   GLUCOSE 213 (H) 11/09/2022 0018   BUN 57 (H) 11/09/2022 0018   BUN 47 (H) 02/16/2022 1008   CREATININE 3.27 (H) 11/09/2022 0018   CREATININE 2.10 (H) 10/07/2022 0939   CALCIUM 8.7 (L) 11/09/2022 0018   CALCIUM 9.9 06/04/2008 2240   GFRNONAA 14 (L) 11/09/2022 0018   GFRAA 45 (L) 02/16/2020 1124   CBC    Component Value Date/Time   WBC 10.3 11/09/2022 0018   RBC 2.84 (L) 11/09/2022 0018   HGB 8.5 (L) 11/09/2022 0018   HGB 11.3 01/30/2021 1154   HCT 28.4 (L) 11/09/2022 0018   HCT 35.9 01/30/2021 1154   PLT 400 11/09/2022 0018   PLT 389 01/30/2021 1154   MCV 100.0 11/09/2022 0018   MCV 91 01/30/2021 1154   MCH 29.9 11/09/2022 0018   MCHC 29.9 (L) 11/09/2022 0018   RDW 20.1 (H) 11/09/2022 0018   RDW 13.4 01/30/2021 1154   LYMPHSABS  1.4 10/25/2022 0730   MONOABS 1.3 (H) 10/25/2022 0730   EOSABS 0.1 10/25/2022 0730   BASOSABS 0.0 10/25/2022 0730     Assessment/Plan:   Acute kidney injury on CKD stage IV likely due to CHF exacerbation/fluid overload/bradycardia leading to reduced renal perfusion.  B/L Cr around 2, followed by Dr. Noah Charon as an outpatient. She had hyperkalemia, anasarca and did not respond with IV diuretics.  Also had features of uremic encephalopathy including tremor, AMS. -temp HD catheter placed on 6/23, started CRRT 6/23. CRRT off on 6/24 given access issues/alarms. RIJ temp line removed 6/26. Uremic symptoms improved at that time   -Cr down to 2.95 but rose to 3.83 and up to 4.36.  Resumed her outpatient torsemide dose of 40 mg daily but worsening UOP and BUN/Cr.    - s/p TDC placement by IR 11/04/22 and had 1 session of IHD 11/05/22 and tolerated it well with UF of 1 liter.  Cr improved from 4.36 to 3.07 but had slowly been climbing but is now stable at 3.27 (same as yesterday).  UOP dropped.  Yest increased IV lasix dose to 160 mg BID with poor response; cont same dose today and add metolazone 5 one time dose.  Follow UOP and BUN/CR but may need to resume IHD tomorrow if no improvement.  Will need renal navigator assistance if she needs to resume IHD and may be good fit for TCU.     -She is interested in home therapies down the road if/when indicated.  She is a poor longterm HD candidate given her advanced age, multiple co-morbidities, and poor functional and nutritional status.   -normal renal artery duplex; no evidence of RAS -daily labs, strict I/O -cont to hold off on discharge for now as she may need to have outpatient HD arrangements made prior.    Bradycardia: Seen by cardiology during admit. H/o PPM extraction secondary to bacteremia (Sep 07, 2022).  Off dopamine gtt, HR stable. Avoid beta blockers   Acute on chronic respiratory failure/respiratory distress with hypoxia: lasix as above. CXR 6/25  revealed improvement in opacities. On 3L [place] at baseline @ home -improved and no evidence of pulmonary edema today   Sepsis due to possible pneumonia: s/p  antibiotics per primary team.   Anemia: transfuse for Hgb <7. ESA started 6/26, increased dose of aranesp to 100 mcg 7/3.  Feraheme x 1 dose 6/28.   CKD MBD: PO4 high  and now normalized on renvela, c/w renal diet    Estill Bakes MD [facility] Pager [phone]

## 2022-11-10 DIAGNOSIS — J9601 Acute respiratory failure with hypoxia: Secondary | ICD-10-CM | POA: Diagnosis not present

## 2022-11-10 DIAGNOSIS — J9602 Acute respiratory failure with hypercapnia: Secondary | ICD-10-CM | POA: Diagnosis not present

## 2022-11-10 LAB — GLUCOSE, CAPILLARY
Glucose-Capillary: 141 mg/dL — ABNORMAL HIGH (ref 70–99)
Glucose-Capillary: 155 mg/dL — ABNORMAL HIGH (ref 70–99)
Glucose-Capillary: 243 mg/dL — ABNORMAL HIGH (ref 70–99)
Glucose-Capillary: 175 mg/dL — ABNORMAL HIGH (ref 70–99)

## 2022-11-10 LAB — RENAL FUNCTION PANEL
Albumin: 2.4 g/dL — ABNORMAL LOW (ref 3.5–5.0)
Anion gap: 12 (ref 5–15)
BUN: 58 mg/dL — ABNORMAL HIGH (ref 8–23)
CO2: 27 mmol/L (ref 22–32)
Calcium: 8.8 mg/dL — ABNORMAL LOW (ref 8.9–10.3)
Chloride: 97 mmol/L — ABNORMAL LOW (ref 98–111)
Creatinine, Ser: 3.22 mg/dL — ABNORMAL HIGH (ref 0.44–1.00)
GFR, Estimated: 14 mL/min — ABNORMAL LOW (ref >60)
Glucose, Bld: 159 mg/dL — ABNORMAL HIGH (ref 70–99)
Phosphorus: 4.3 mg/dL (ref 2.5–4.6)
Potassium: 3.9 mmol/L (ref 3.5–5.1)
Sodium: 136 mmol/L (ref 135–145)

## 2022-11-10 LAB — MAGNESIUM: Magnesium: 1.8 mg/dL (ref 1.7–2.4)

## 2022-11-10 MED ORDER — TORSEMIDE 20 MG PO TABS
100.0000 mg | ORAL_TABLET | Freq: Two times a day (BID) | ORAL | Status: DC
  Administered 2022-11-10 – 2022-11-14 (×8): 100 mg via ORAL
  Filled 2022-11-10 (×8): qty 5

## 2022-11-10 MED ORDER — ESCITALOPRAM OXALATE 10 MG PO TABS
5.0000 mg | ORAL_TABLET | Freq: Every day | ORAL | Status: DC
  Administered 2022-11-10 – 2022-11-14 (×4): 5 mg via ORAL
  Filled 2022-11-10 (×4): qty 1

## 2022-11-10 MED ORDER — LORAZEPAM 0.5 MG PO TABS
0.2500 mg | ORAL_TABLET | Freq: Two times a day (BID) | ORAL | Status: AC
  Administered 2022-11-10 (×2): 0.25 mg via ORAL
  Filled 2022-11-10 (×2): qty 1

## 2022-11-10 MED ORDER — ISOSORBIDE MONONITRATE ER 60 MG PO TB24
60.0000 mg | ORAL_TABLET | Freq: Every day | ORAL | Status: DC
  Administered 2022-11-10 – 2022-11-14 (×4): 60 mg via ORAL
  Filled 2022-11-10 (×4): qty 1

## 2022-11-10 MED ORDER — METOLAZONE 2.5 MG PO TABS
2.5000 mg | ORAL_TABLET | Freq: Every day | ORAL | Status: DC
  Administered 2022-11-10 – 2022-11-12 (×3): 2.5 mg via ORAL
  Filled 2022-11-10 (×4): qty 1

## 2022-11-10 NOTE — Progress Notes (Signed)
Patient husband took home unit and mask home.  Patient stated that she will wear the nasal cannula tonight and have husband bring back her nasal pillow mask since she's not being discharged. RT will continue to monitor.

## 2022-11-10 NOTE — Progress Notes (Signed)
Patient ID: Kayla [name], female   DOB: 01-08-1941, 82 y.o.   MRN: [id number] S: No new complaints but desperately wants to go home due to poor sleep here.  Husband bedside.  Doesn't like restricted diet but no dysgeusia or confusion.  UOP only yesterday with lasix 160 IV BID + metolazone BUT does say purewick dislodged a few times so some missed urine. Wt is not being assessed daily.   O:BP (!) 158/44 (BP Location: Left Arm)   Pulse (!) 54   Temp 97.6 F (36.4 C) (Oral)   Resp 13   Ht 5\' 5"  (1.651 m)   Wt 102.8 kg Comment: bed  SpO2 95%   BMI 37.71 kg/m   Intake/Output Summary (Last 24 hours) at 11/10/2022 0758 Last data filed at 11/10/2022 0730 Gross per 24 hour  Intake 135 ml  Output 950 ml  Net -815 ml    Intake/Output: Yesterday NR/0.65L Gen: NAD CV:  no rub Resp: on O2, a few wheezes, no rales; normal WOB at rest Abd: +BS, soft, NT/ND Ext: 1-2+ pretibial edema with ruddy discoloartion  Recent Labs  Lab 11/04/22 0004 11/05/22 0002 11/06/22 0023 11/07/22 0010 11/08/22 0007 11/09/22 0018 11/10/22 0013  NA 133* 132* 132* 136 136 134* 136  K 4.4 4.6 4.1 4.2 4.1 3.8 3.9  CL 96* 95* 95* 97* 100 96* 97*  CO2 25 25 25 27 25 27 27   GLUCOSE 195* 213* 192* 208* 139* 213* 159*  BUN 74* 77* 51* 56* 56* 57* 58*  CREATININE 4.28* 4.36* 3.07* 3.16* 3.27* 3.27* 3.22*  ALBUMIN 2.3* 2.2* 2.5* 2.3* 2.4* 2.4* 2.4*  CALCIUM 8.4* 8.4* 8.4* 8.5* 8.6* 8.7* 8.8*  PHOS 6.2* 5.7* 4.1 4.6 4.8* 4.7* 4.3    Liver Function Tests: Recent Labs  Lab 11/08/22 0007 11/09/22 0018 11/10/22 0013  ALBUMIN 2.4* 2.4* 2.4*    No results for input(s): "LIPASE", "AMYLASE" in the last 168 hours. No results for input(s): "AMMONIA" in the last 168 hours. CBC: Recent Labs  Lab 11/04/22 0004 11/05/22 0002 11/07/22 0010 11/09/22 0018  WBC 11.7* 12.0* 11.5* 10.3  HGB 7.3* 7.4* 7.6* 8.5*  HCT 25.0* 25.1* 25.2* 28.4*  MCV 100.8* 98.4 98.4 100.0  PLT 374 385 387 400    Cardiac  Enzymes: No results for input(s): "CKTOTAL", "CKMB", "CKMBINDEX", "TROPONINI" in the last 168 hours. CBG: Recent Labs  Lab 11/09/22 0607 11/09/22 1108 11/09/22 1700 11/09/22 2129 11/10/22 0612  GLUCAP 130* 209* 181* 159* 141*     Iron Studies: No results for input(s): "IRON", "TIBC", "TRANSFERRIN", "FERRITIN" in the last 72 hours. Studies/Results: No results found.  amLODipine  10 mg Oral Daily   apixaban  2.5 mg Oral BID   atorvastatin  40 mg Oral Daily   bisacodyl  10 mg Rectal Daily   Or   bisacodyl  10 mg Oral Daily   Chlorhexidine Gluconate Cloth  6 each Topical Daily   darbepoetin (ARANESP) injection - NON-DIALYSIS  100 mcg Subcutaneous Q Wed-1800   feeding supplement (NEPRO CARB STEADY)  237 mL Oral BID BM   guaiFENesin  600 mg Oral BID   insulin aspart  0-6 Units Subcutaneous TID WC   isosorbide mononitrate  30 mg Oral Daily   levothyroxine  200 mcg Oral Q0600   lidocaine  1 patch Transdermal Q24H   multivitamin  1 tablet Oral QHS   mouth rinse  15 mL Mouth Rinse 4 times per day   sevelamer carbonate  800 mg Oral  TID WC   sodium chloride flush  3 mL Intravenous Q12H    BMET    Component Value Date/Time   NA 136 11/10/2022 0013   NA 140 02/16/2022 1008   K 3.9 11/10/2022 0013   CL 97 (L) 11/10/2022 0013   CO2 27 11/10/2022 0013   GLUCOSE 159 (H) 11/10/2022 0013   BUN 58 (H) 11/10/2022 0013   BUN 47 (H) 02/16/2022 1008   CREATININE 3.22 (H) 11/10/2022 0013   CREATININE 2.10 (H) 10/07/2022 0939   CALCIUM 8.8 (L) 11/10/2022 0013   CALCIUM 9.9 06/04/2008 2240   GFRNONAA 14 (L) 11/10/2022 0013   GFRAA 45 (L) 02/16/2020 1124   CBC    Component Value Date/Time   WBC 10.3 11/09/2022 0018   RBC 2.84 (L) 11/09/2022 0018   HGB 8.5 (L) 11/09/2022 0018   HGB 11.3 01/30/2021 1154   HCT 28.4 (L) 11/09/2022 0018   HCT 35.9 01/30/2021 1154   PLT 400 11/09/2022 0018   PLT 389 01/30/2021 1154   MCV 100.0 11/09/2022 0018   MCV 91 01/30/2021 1154   MCH 29.9  11/09/2022 0018   MCHC 29.9 (L) 11/09/2022 0018   RDW 20.1 (H) 11/09/2022 0018   RDW 13.4 01/30/2021 1154   LYMPHSABS 1.4 10/25/2022 0730   MONOABS 1.3 (H) 10/25/2022 0730   EOSABS 0.1 10/25/2022 0730   BASOSABS 0.0 10/25/2022 0730     Assessment/Plan:   Acute kidney injury on CKD stage IV likely due to CHF exacerbation/fluid overload/bradycardia leading to reduced renal perfusion.  B/L Cr around 2, followed by Dr. Noah Charon as an outpatient. She had hyperkalemia, anasarca and did not respond with IV diuretics.  Also had features of uremic encephalopathy including tremor, AMS. -temp HD catheter placed on 6/23, started CRRT 6/23. CRRT off on 6/24 given access issues/alarms. RIJ temp line removed 6/26. Uremic symptoms improved at that time   -Cr down to 2.95 but rose to 3.83 and up to 4.36.  Resumed her outpatient torsemide dose of 40 mg daily but worsening UOP and BUN/Cr.    - s/p TDC placement by IR 11/04/22 and had 1 session of IHD 11/05/22 and tolerated it well with UF of 1 liter.  Cr improved from 4.36 to 3.07 but had slowly been climbing but is now stable in  3.2s (stable for 48h).  Largest concern is fairly low UOP with high dose diuretic.  Not sure she will be able to maintain outpt without dialysis given low UOP.   Will change to torsemide 100 BID + metolazone 2.5 daily today and see.  May need to resume IHD tomorrow if no improvement.  Will need renal navigator assistance if she needs to resume IHD and may be good fit for TCU.     -She is interested in home therapies down the road if/when indicated.  She is a poor longterm HD candidate given her advanced age, multiple co-morbidities, and poor functional and nutritional status.   -normal renal artery duplex; no evidence of RAS -daily labs, strict I/O -cont to hold off on discharge for now as she may need to have outpatient HD arrangements made prior.    Bradycardia: Seen by cardiology during admit. H/o PPM extraction secondary to  bacteremia (Sep 07, 2022).  Off dopamine gtt, HR stable. Avoid beta blockers   Acute on chronic respiratory failure/respiratory distress with hypoxia: lasix as above. CXR 6/25 revealed improvement in opacities. On 3L [place] at baseline @ home -improved and no evidence of pulmonary  edema today   Sepsis due to possible pneumonia: s/p  antibiotics per primary team.   Anemia: transfuse for Hgb <7. ESA started 6/26, increased dose of aranesp to 100 mcg 7/3. Feraheme x 1 dose 6/28.   CKD MBD: PO4 high  and now normalized on renvela, c/w renal diet  HTN: remain hypertensive. Increase imdur to 60.     Estill Bakes MD [facility] Pager [phone]

## 2022-11-10 NOTE — Progress Notes (Signed)
Mobility Specialist Progress Note:   11/10/22 1500  Mobility  Activity Ambulated with assistance in hallway  Level of Assistance Contact guard assist, steadying assist  Assistive Device Front wheel walker  Distance Ambulated (ft) 80 ft  Activity Response Tolerated well  Mobility Referral Yes  $Mobility charge 1 Mobility  Mobility Specialist Start Time (ACUTE ONLY) 1323  Mobility Specialist Stop Time (ACUTE ONLY) 1400  Mobility Specialist Time Calculation (min) (ACUTE ONLY) 37 min    Pre Mobility: 54 HR,  97% SpO2 During Mobility: 61 HR,  94% SpO2 Post Mobility:  56 HR, 94% SpO2  Pt received in chair on 3L O2, agreeable to mobility. MinG for STS, CG for ambulation. Ambulated in hallway w/ RW and chair follow. C/o dizziness and SOB, requiring x3 seated rest breaks. VSS throughout. Pt unable to ambulate back to room, so she was wheeled back. Pt left in chair with call bell and family present.  D'Vante Earlene Plater Mobility Specialist Please contact via Special educational needs teacher or Rehab office at [phone]

## 2022-11-10 NOTE — Progress Notes (Signed)
Occupational Therapy Treatment Patient Details Name: Kayla [name] MRN: [id number] DOB: 1940-05-20 Today's Date: 11/10/2022   History of present illness Pt is 82 year old female brought to the ED 10/25/22 for SoB and AMS. Found to be in respiratory distress, HR 30s. ABG pH 7.1 with high pCO2 and low bicarbonate, Alo noted to have AKI. CRRT 6/23-6/24  PMH: obesity, type 2 diabetes, hypertension, hyperlipidemia, CAD, congestive heart failure, paroxysmal atrial fibrillation (on Eliquis), sick sinus syndrome status post pacemaker, CKD, anemia, COPD, GERD, hypothyroidism, chronic venous insufficiency, status post left first ray amputation (09/04/2022) with active osteomyelitis   OT comments  Pt progressing towards goals, needs min guard-min A for standing grooming task and simulated toilet transfer. Pt min A for bed mobility and min guard for transfers with RW. VSS on 3L O2 and pt able to stand at sink x2 min for grooming task before fatiguing and needing seated rest break. Pt presenting with impairments listed below, will follow acutely. Patient will benefit from continued inpatient follow up therapy, <3 hours/day to maximize safety/ind with ADLs/functional mobility.    Recommendations for follow up therapy are one component of a multi-disciplinary discharge planning process, led by the attending physician.  Recommendations may be updated based on patient status, additional functional criteria and insurance authorization.    Assistance Recommended at Discharge Frequent or constant Supervision/Assistance  Patient can return home with the following  A little help with walking and/or transfers;Direct supervision/assist for medications management;Direct supervision/assist for financial management;Assist for transportation;Assistance with cooking/housework;A lot of help with bathing/dressing/bathroom   Equipment Recommendations  None recommended by OT    Recommendations for Other Services       Precautions / Restrictions Precautions Precautions: Fall Precaution Comments: Dizziness noted with position changes: Restrictions Weight Bearing Restrictions: No       Mobility Bed Mobility Overal bed mobility: Needs Assistance         Sit to supine: Min assist   General bed mobility comments: min assist for BLE"s    Transfers Overall transfer level: Needs assistance Equipment used: Rolling walker (2 wheels) Transfers: Sit to/from Stand Sit to Stand: Min guard                 Balance Overall balance assessment: Needs assistance Sitting-balance support: Feet supported, No upper extremity supported Sitting balance-Leahy Scale: Fair     Standing balance support: Reliant on assistive device for balance Standing balance-Leahy Scale: Poor Standing balance comment: Reliant on RW for UE support during functional mobility                           ADL either performed or assessed with clinical judgement   ADL Overall ADL's : Needs assistance/impaired     Grooming: Oral care;Standing;Min guard Grooming Details (indicate cue type and reason): needs to sit after ~2 mins                 Toilet Transfer: Minimal assistance;Ambulation;Rolling walker (2 wheels);Regular Teacher, adult education Details (indicate cue type and reason): simulated via functional mobility         Functional mobility during ADLs: Minimal assistance;Rolling walker (2 wheels)      Extremity/Trunk Assessment Upper Extremity Assessment Upper Extremity Assessment: Generalized weakness   Lower Extremity Assessment Lower Extremity Assessment: Defer to PT evaluation        Vision   Vision Assessment?: No apparent visual deficits   Perception Perception Perception: Not tested   Praxis Praxis  Praxis: Not tested    Cognition Arousal/Alertness: Awake/alert Behavior During Therapy: WFL for tasks assessed/performed Overall Cognitive Status: Impaired/Different from  baseline Area of Impairment: Attention, Following commands, Safety/judgement, Awareness                   Current Attention Level: Focused Memory: Decreased short-term memory Following Commands: Follows one step commands consistently Safety/Judgement: Decreased awareness of safety, Decreased awareness of deficits Awareness: Emergent Problem Solving: Requires verbal cues, Requires tactile cues General Comments: Increased time for processing one step commands. easily irritated        Exercises      Shoulder Instructions       General Comments VSS on 3L O2 throughout session    Pertinent Vitals/ Pain       Pain Assessment Pain Assessment: Faces  Home Living                                          Prior Functioning/Environment              Frequency  Min 1X/week        Progress Toward Goals  OT Goals(current goals can now be found in the care plan section)  Progress towards OT goals: Progressing toward goals  Acute Rehab OT Goals Patient Stated Goal: to go home OT Goal Formulation: With patient Time For Goal Achievement: 11/12/22 Potential to Achieve Goals: Fair ADL Goals Pt Will Perform Grooming: standing;with supervision Pt Will Perform Upper Body Dressing: with set-up;sitting Pt Will Perform Lower Body Dressing: with min assist;sit to/from stand Pt Will Transfer to Toilet: with modified independence;ambulating;regular height toilet  Plan Discharge plan needs to be updated    Co-evaluation                 AM-PAC OT "6 Clicks" Daily Activity     Outcome Measure   Help from another person eating meals?: None Help from another person taking care of personal grooming?: A Little Help from another person toileting, which includes using toliet, bedpan, or urinal?: A Little Help from another person bathing (including washing, rinsing, drying)?: A Lot Help from another person to put on and taking off regular upper body  clothing?: A Little Help from another person to put on and taking off regular lower body clothing?: A Lot 6 Click Score: 17    End of Session Equipment Utilized During Treatment: Rolling walker (2 wheels);Oxygen  OT Visit Diagnosis: Unsteadiness on feet (R26.81);Muscle weakness (generalized) (M62.81);Other symptoms and signs involving cognitive function   Activity Tolerance Patient tolerated treatment well   Patient Left in bed;with call bell/phone within reach;with bed alarm set;with family/visitor present;with nursing/sitter in room   Nurse Communication Mobility status        Time: [id number] OT Time Calculation (min): 21 min  Charges: OT General Charges $OT Visit: 1 Visit OT Treatments $Self Care/Home Management : 8-22 mins  Carver Fila, OTD, OTR/L SecureChat Preferred Acute Rehab [phone]   Carver Fila Koonce 11/10/2022, 9:48 AM

## 2022-11-10 NOTE — Progress Notes (Signed)
PROGRESS NOTE  Kayla [name] ZOX:[id number] DOB: 18-Sep-1940 DOA: 10/25/2022 PCP: Ailene Ravel, MD  Brief History   Patient is an 82 year old Caucasian female, significantly obese, with recent history of MRSA bacteremia/infective endocarditis for which patient was on antibiotics for a long time.  Other medical history includes coronary artery disease, carotid artery disease, chronic kidney disease stage IV, COPD, diabetes mellitus type 2, hypertension, hyperlipidemia, hypothyroidism and documented congestive heart failure.  Recent echocardiograms revealed normal left ventricular ejection fraction, without mention of diastolic dysfunction.  Patient was admitted with pulmonary edema, acute kidney injury on chronic kidney disease stage IV with oliguria versus anuria, requiring CRRT for about 48 hours.  Renal function initially improved, but patient has had to have renal replacement therapy (conventional hemodialysis on 1 occasion).  Patient has TDC.  Patient was initially on very high-dose IV Lasix (160 Mg twice daily).  Diuretics has been changed to oral torsemide and metolazone.  Patient continues to make urine, but may end up needing hemodialysis.  Nephrology team is following patient closely.    Palliative care has been consulted. They have confirmed that the patient wishes to be a DNR, but wants everything otherwise to be done to keep her alive. This will include HD.   11/10/2022: Patient seen.  No new complaints.  Patient continues to make some urine.  Serum creatinine is about stable.  Nephrology input is appreciated.  Patient is currently on torsemide 100 Mg p.o. once daily and metolazone 2.5 Mg p.o. once daily.  Continue to manage expectantly.  Continue to assess need for renal replacement therapy daily.    Subjective  -No new complaints.  No shortness of breath.  No chest pain.  Patient has right chest TDC. Objective   Vitals:  Vitals:   11/10/22 0344 11/10/22 0725  BP: (!) 158/41 (!)  158/44  Pulse: (!) 52 (!) 54  Resp: 16 13  Temp: 98 F (36.7 C) 97.6 F (36.4 C)  SpO2: 95%     Exam:  Constitutional:  The patient is awake, alert, and oriented x 3. No acute distress. Respiratory:  Decreased air entry globally. Cardiovascular:  S1-S2.   Abdomen:  Abdomen is obese, soft and nontender.   Musculoskeletal:  Bilateral lower extremity edema.  Neurologic:  Awake and alert.  Moves all extremities.   I have personally reviewed the following:   Today's Data   Vitals:   11/10/22 0344 11/10/22 0725  BP: (!) 158/41 (!) 158/44  Pulse: (!) 52 (!) 54  Resp: 16 13  Temp: 98 F (36.7 C) 97.6 F (36.4 C)  SpO2: 95%      Lab Data      Latest Ref Rng & Units 11/09/2022   12:18 AM 11/07/2022   12:10 AM 11/05/2022   12:02 AM  CBC  WBC 4.0 - 10.5 K/uL 10.3  11.5  12.0   Hemoglobin 12.0 - 15.0 g/dL 8.5  7.6  7.4   Hematocrit 36.0 - 46.0 % 28.4  25.2  25.1   Platelets 150 - 400 K/uL 400  387  385       Latest Ref Rng & Units 11/10/2022   12:13 AM 11/09/2022   12:18 AM 11/08/2022   12:07 AM  BMP  Glucose 70 - 99 mg/dL 811  914  782   BUN 8 - 23 mg/dL 58  57  56   Creatinine 0.44 - 1.00 mg/dL 9.56  2.13  0.86   Sodium 135 - 145 mmol/L 136  134  136   Potassium 3.5 - 5.1 mmol/L 3.9  3.8  4.1   Chloride 98 - 111 mmol/L 97  96  100   CO2 22 - 32 mmol/L 27  27  25    Calcium 8.9 - 10.3 mg/dL 8.8  8.7  8.6     Micro Data   Results for orders placed or performed during the hospital encounter of 10/25/22  Culture, blood (routine x 2)     Status: None   Collection Time: 10/25/22  8:24 AM   Specimen: BLOOD LEFT ARM  Result Value Ref Range Status   Specimen Description BLOOD LEFT ARM  Final   Special Requests   Final    BOTTLES DRAWN AEROBIC AND ANAEROBIC Blood Culture adequate volume   Culture   Final    NO GROWTH 5 DAYS Performed at [facility], [address] [place], [place] [address]    Report Status 10/30/2022 FINAL  Final  Culture, blood (routine x  2)     Status: None   Collection Time: 10/25/22  8:26 AM   Specimen: BLOOD  Result Value Ref Range Status   Specimen Description BLOOD SITE NOT SPECIFIED  Final   Special Requests   Final    BOTTLES DRAWN AEROBIC AND ANAEROBIC Blood Culture results may not be optimal due to an inadequate volume of blood received in culture bottles   Culture   Final    NO GROWTH 5 DAYS Performed at [facility], [address] [place], [place] [address]    Report Status 10/30/2022 FINAL  Final  MRSA Next Gen by PCR, Nasal     Status: None   Collection Time: 10/25/22 11:46 AM   Specimen: Nasal Mucosa; Nasal Swab  Result Value Ref Range Status   MRSA by PCR Next Gen NOT DETECTED NOT DETECTED Final    Comment: (NOTE) The GeneXpert MRSA Assay (FDA approved for NASAL specimens only), is one component of a comprehensive MRSA colonization surveillance program. It is not intended to diagnose MRSA infection nor to guide or monitor treatment for MRSA infections. Test performance is not FDA approved in patients less than 86 years old. Performed at [facility], [address], [place], [place] [address]     Scheduled Meds:  amLODipine  10 mg Oral Daily   apixaban  2.5 mg Oral BID   atorvastatin  40 mg Oral Daily   bisacodyl  10 mg Rectal Daily   Or   bisacodyl  10 mg Oral Daily   Chlorhexidine Gluconate Cloth  6 each Topical Daily   darbepoetin (ARANESP) injection - NON-DIALYSIS  100 mcg Subcutaneous Q Wed-1800   escitalopram  5 mg Oral Daily   feeding supplement (NEPRO CARB STEADY)  237 mL Oral BID BM   guaiFENesin  600 mg Oral BID   insulin aspart  0-6 Units Subcutaneous TID WC   isosorbide mononitrate  60 mg Oral Daily   levothyroxine  200 mcg Oral Q0600   lidocaine  1 patch Transdermal Q24H   LORazepam  0.25 mg Oral BID   metolazone  2.5 mg Oral Daily   multivitamin  1 tablet Oral QHS   mouth rinse  15 mL Mouth Rinse 4 times per day   sevelamer carbonate  800 mg Oral TID WC    sodium chloride flush  3 mL Intravenous Q12H   torsemide  100 mg Oral BID   Continuous Infusions:  sodium chloride Stopped (10/26/22 1934)  Principal Problem:   Acute respiratory failure with hypoxia and hypercapnia (HCC) Active Problems:   AKI (acute kidney injury) (HCC)   Junctional bradycardia   Hyperkalemia   LOS: 16 days    A & P  Assessment & Plan:   Principal Problem:   Acute respiratory failure with hypoxia and hypercapnia (HCC) Active Problems:   AKI (acute kidney injury) (HCC)   Junctional bradycardia   Hyperkalemia   Acute kidney injury on chronic kidney disease stage IV/pulmonary edema: -AKI is worsening. -Serum creatinine has increased to 4.28. It has been increasing for the last 3 days. -Nephrology has changed diuresis from lasix 80 mg bid to torsemide 40 mg daily, but creatinine continues to rise. -Patient was on CRRT for about 48 hours. She states that she would agree to HD. Nephrology is now considering it. -The patient has had poor PO intake of fluids. She has been encouraged to drink, but she states that she doesn't like to drink, because she fears wetting the bed.  -Strict I's and O's. -Continue to monitor input and input. -Avoid nephrotoxins. -Dose all medications assuming GFR of less than 10 mL/min. -Keep MAP greater than 65 mmHg. 11/08/2022: Patient seen.  Worsening renal function.  Nephrology input is appreciated.  No hemodialysis today.  IV Lasix increased to 160 Mg twice daily.  Possible renal replacement therapy tomorrow. 11/09/2022: Serum creatinine remains the same.  Input and output documentation is likely not accurate.  Patient is on IV Lasix 160 Mg twice daily.  Acute on chronic hypoxic/hypercapnic respiratory failure: -Patient has been on oxygen for about 1 year (2 L/min via nasal cannula). -Etiology is likely multifactorial. -Pt complains of increasing work of breathing. Likely due to worsening renal statu -Discussed need to comply with CPAP  management and, treatment of OSA/likely undiagnosed OHS. 11/05/2022: Stable.   COPD: -Seems compensated. -Continue current regimen.   Left lateral chest wall pain: -X-ray of the ribs revealed minimally displaced posterolateral left seventh rib fracture.  -Adequate pain control. -Lidocaine patch to left lateral rib cage area. -Supportive management.   Paroxysmal atrial fibrillation: -Controlled heart rate. -Patient is on apixaban 2.5 Mg p.o. twice daily.   Anemia of chronic kidney disease: -Patient has received IV iron. -Patient is on Aranesp every 2 weeks.   Hypertension: -Continue to optimize. -Goal blood pressure should be less than 130/80 mmHg.   Diabetes mellitus:  -Continue sliding scale insulin coverage. -Continue to monitor blood sugar and adjust accordingly.   Hyperlipidemia: -Continue atorvastatin.   Possible community acquired pneumonia: -Findings/events may be suggestive of pulmonary edema. -IV Zosyn has been discontinued.     DVT prophylaxis: Apixaban. Code Status: DO NOT RESUSCITATE. Family Communication: Husband. Disposition Plan: This will depend on hospital course.     Consultants:  Nephrology.   Procedures:  Patient was on CRRT.   Antimicrobials:  IV Zosyn has been discontinued.    Berton Mount, MD. [facility] Direct contact: see [email or web address]  7PM-7AM contact night coverage as above 11/04/2022, 5:43 PM  LOS: 9 days

## 2022-11-10 NOTE — Consult Note (Signed)
   [facility] CM Inpatient Consult   11/10/2022  JENNELLE JEANTY [birth year] [id number]  [facility] [[facility]]  Accountable Care Organization [ACO] Patient: [employer]   Follow Up:  LLOS day 16  [ICU stay noted] now in Progressive Care LOC noted  Primary Care Provider: Ailene Ravel, MD  Met with patient at the bedside, having lunch.  Husband at the bedside. Explained reason for visit today to check for any community care coordination needs and progress. Explained follow up to support PCP.  They endorses PCP.    Patient states, "Well, I think I'm doing great."  Husband inserts that, "if she doesn't get all of the fluid off of her she'll be right back. I have concerns for the wheelchair that was ordered for her previously doesn't fit through our door and wanting to see if that can be changed."  Encouraged them to mention this to the inpatient [facility] team members.  Verbalized understanding for their request with inpatient [facility] team.  Notes disposition is still being recommended for a SNF level of care for rehab.  Plan:  Continue to follow, patient is guarded when speaking with her about any post hospital needs.  For questions,  Charlesetta Shanks, RN BSN CCM [facility]  [phone] business mobile phone Toll free office [phone]  *Concierge Line  [phone] Fax number: [phone] [email or web address]

## 2022-11-11 DIAGNOSIS — J9601 Acute respiratory failure with hypoxia: Secondary | ICD-10-CM | POA: Diagnosis not present

## 2022-11-11 DIAGNOSIS — J9602 Acute respiratory failure with hypercapnia: Secondary | ICD-10-CM | POA: Diagnosis not present

## 2022-11-11 LAB — GLUCOSE, CAPILLARY
Glucose-Capillary: 135 mg/dL — ABNORMAL HIGH (ref 70–99)
Glucose-Capillary: 238 mg/dL — ABNORMAL HIGH (ref 70–99)
Glucose-Capillary: 155 mg/dL — ABNORMAL HIGH (ref 70–99)
Glucose-Capillary: 305 mg/dL — ABNORMAL HIGH (ref 70–99)

## 2022-11-11 LAB — RENAL FUNCTION PANEL
Albumin: 2.5 g/dL — ABNORMAL LOW (ref 3.5–5.0)
Anion gap: 13 (ref 5–15)
BUN: 56 mg/dL — ABNORMAL HIGH (ref 8–23)
CO2: 25 mmol/L (ref 22–32)
Calcium: 8.9 mg/dL (ref 8.9–10.3)
Chloride: 98 mmol/L (ref 98–111)
Creatinine, Ser: 3.32 mg/dL — ABNORMAL HIGH (ref 0.44–1.00)
GFR, Estimated: 13 mL/min — ABNORMAL LOW (ref >60)
Glucose, Bld: 141 mg/dL — ABNORMAL HIGH (ref 70–99)
Phosphorus: 4.2 mg/dL (ref 2.5–4.6)
Potassium: 4.3 mmol/L (ref 3.5–5.1)
Sodium: 136 mmol/L (ref 135–145)

## 2022-11-11 LAB — CBC
HCT: 28.7 % — ABNORMAL LOW (ref 36.0–46.0)
Hemoglobin: 8.4 g/dL — ABNORMAL LOW (ref 12.0–15.0)
MCH: 29.4 pg (ref 26.0–34.0)
MCHC: 29.3 g/dL — ABNORMAL LOW (ref 30.0–36.0)
MCV: 100.3 fL — ABNORMAL HIGH (ref 80.0–100.0)
Platelets: 402 10*3/uL — ABNORMAL HIGH (ref 150–400)
RBC: 2.86 MIL/uL — ABNORMAL LOW (ref 3.87–5.11)
RDW: 20.2 % — ABNORMAL HIGH (ref 11.5–15.5)
WBC: 8.9 10*3/uL (ref 4.0–10.5)
nRBC: 0 % (ref 0.0–0.2)

## 2022-11-11 LAB — MAGNESIUM: Magnesium: 1.9 mg/dL (ref 1.7–2.4)

## 2022-11-11 MED ORDER — HEPARIN SODIUM (PORCINE) 1000 UNIT/ML IJ SOLN
INTRAMUSCULAR | Status: AC
  Filled 2022-11-11: qty 4

## 2022-11-11 MED ORDER — HYDROXYZINE HCL 10 MG PO TABS
10.0000 mg | ORAL_TABLET | Freq: Three times a day (TID) | ORAL | Status: DC | PRN
  Administered 2022-11-11 – 2022-11-12 (×3): 10 mg via ORAL
  Filled 2022-11-11 (×3): qty 1

## 2022-11-11 NOTE — Progress Notes (Signed)
Contacted by nephrologist regarding pt's need for out-pt HD at d/c. Communicating with CSW regarding snf placement and snf's request for clinic placement. Will follow and assist accordingly.   Olivia Canter Renal Navigator [phone]

## 2022-11-11 NOTE — Progress Notes (Signed)
PROGRESS NOTE    Kayla [name]  ZOX:[id number] DOB: 1941-02-12 DOA: 10/25/2022 PCP: Ailene Ravel, MD    Brief Narrative:  82 year old Caucasian female, significantly obese, with recent history of MRSA bacteremia/infective endocarditis for which patient was on antibiotics for a long time.  Other medical history includes coronary artery disease, carotid artery disease, chronic kidney disease stage IV, COPD, diabetes mellitus type 2, hypertension, hyperlipidemia, hypothyroidism and documented congestive heart failure.  Recent echocardiograms revealed normal left ventricular ejection fraction, without mention of diastolic dysfunction.  Patient was admitted with pulmonary edema, acute kidney injury on chronic kidney disease stage IV with oliguria versus anuria, requiring CRRT for about 48 hours and now HD.     Assessment and Plan: Acute kidney injury on chronic kidney disease stage IV/pulmonary edema: -Nephrology has changed diuresis from lasix 80 mg bid to torsemide 40 mg daily, but creatinine continues to rise. -Patient was on CRRT for about 48 hours. She states that she would agree to HD.  -starting HD on 7/10:Given ongoing difficulty with volume management will do dialysis today and initiate process to pursue outpt AKI dialysis. Renal navigator has been alerted.    Acute on chronic hypoxic/hypercapnic respiratory failure: -Patient has been on oxygen for about 1 year (2 L/min via nasal cannula). -Etiology is likely multifactorial. -Pt complains of increasing work of breathing. Likely due to worsening renal statu -Discussed need to comply with CPAP management and, treatment of OSA/likely undiagnosed OHS.   COPD: -Seems compensated. -Continue current regimen.   Left lateral chest wall pain: -X-ray of the ribs revealed minimally displaced posterolateral left seventh rib fracture.  -Adequate pain control. -Lidocaine patch to left lateral rib cage area. -Supportive management.    Paroxysmal atrial fibrillation: -Controlled heart rate. -Patient is on apixaban 2.5 Mg p.o. twice daily.   Anemia of chronic kidney disease: -Patient has received IV iron. -Patient is on Aranesp every 2 weeks.   Hypertension: -Continue to optimize.   Diabetes mellitus:  -SSI   Hyperlipidemia: -Continue atorvastatin.   Possible community acquired pneumonia: -Findings/events may be suggestive of pulmonary edema. -abx stopped     DVT prophylaxis: apixaban (ELIQUIS) tablet 2.5 mg Start: 10/29/22 1100 SCDs Start: 10/25/22 0958 apixaban (ELIQUIS) tablet 2.5 mg    Code Status: DNR Family Communication: at bedside-- husband may want to take home and not go to SNF-- TOC alerted   Disposition Plan:  Level of care: Progressive Status is: Inpatient Remains inpatient appropriate     Consultants:  PCCM renal   Subjective: Ok with getting HD today  Objective: Vitals:   11/11/22 0922 11/11/22 1112 11/11/22 1211 11/11/22 1216  BP: (!) 140/40 (!) 153/46 (!) 134/38 (!) 142/45  Pulse: (!) 55 (!) 50 (!) 44 (!) 49  Resp: 14 15 16 15   Temp: (!) 97.4 F (36.3 C) (!) 97.5 F (36.4 C) 97.7 F (36.5 C)   TempSrc: Oral Oral    SpO2: 95% 94% 99% 99%  Weight:      Height:        Intake/Output Summary (Last 24 hours) at 11/11/2022 1301 Last data filed at 11/11/2022 0935 Gross per 24 hour  Intake 123 ml  Output 900 ml  Net -777 ml   Filed Weights   11/05/22 0850 11/05/22 1155 11/11/22 0500  Weight: 103.7 kg 102.8 kg 98.2 kg    Examination:   General: Appearance:    Obese female in no acute distress     Lungs:     respirations unlabored  Heart:    Bradycardic.       Neurologic:   Awake, alert       Data Reviewed: I have personally reviewed following labs and imaging studies  CBC: Recent Labs  Lab 11/05/22 0002 11/07/22 0010 11/09/22 0018 11/11/22 0000  WBC 12.0* 11.5* 10.3 8.9  HGB 7.4* 7.6* 8.5* 8.4*  HCT 25.1* 25.2* 28.4* 28.7*  MCV 98.4 98.4 100.0  100.3*  PLT 385 387 400 402*   Basic Metabolic Panel: Recent Labs  Lab 11/07/22 0010 11/08/22 0007 11/09/22 0018 11/10/22 0013 11/11/22 0000  NA 136 136 134* 136 136  K 4.2 4.1 3.8 3.9 4.3  CL 97* 100 96* 97* 98  CO2 27 25 27 27 25   GLUCOSE 208* 139* 213* 159* 141*  BUN 56* 56* 57* 58* 56*  CREATININE 3.16* 3.27* 3.27* 3.22* 3.32*  CALCIUM 8.5* 8.6* 8.7* 8.8* 8.9  MG 1.9 1.9 1.9 1.8 1.9  PHOS 4.6 4.8* 4.7* 4.3 4.2   GFR: Estimated Creatinine Clearance: 15.4 mL/min (A) (by C-G formula based on SCr of 3.32 mg/dL (H)). Liver Function Tests: Recent Labs  Lab 11/07/22 0010 11/08/22 0007 11/09/22 0018 11/10/22 0013 11/11/22 0000  ALBUMIN 2.3* 2.4* 2.4* 2.4* 2.5*   No results for input(s): "LIPASE", "AMYLASE" in the last 168 hours. No results for input(s): "AMMONIA" in the last 168 hours. Coagulation Profile: No results for input(s): "INR", "PROTIME" in the last 168 hours. Cardiac Enzymes: No results for input(s): "CKTOTAL", "CKMB", "CKMBINDEX", "TROPONINI" in the last 168 hours. BNP (last 3 results) Recent Labs    02/16/22 1008  PROBNP 699   HbA1C: No results for input(s): "HGBA1C" in the last 72 hours. CBG: Recent Labs  Lab 11/10/22 1142 11/10/22 1534 11/10/22 2156 11/11/22 0628 11/11/22 1110  GLUCAP 155* 243* 175* 135* 238*   Lipid Profile: No results for input(s): "CHOL", "HDL", "LDLCALC", "TRIG", "CHOLHDL", "LDLDIRECT" in the last 72 hours. Thyroid Function Tests: No results for input(s): "TSH", "T4TOTAL", "FREET4", "T3FREE", "THYROIDAB" in the last 72 hours. Anemia Panel: No results for input(s): "VITAMINB12", "FOLATE", "FERRITIN", "TIBC", "IRON", "RETICCTPCT" in the last 72 hours. Sepsis Labs: No results for input(s): "PROCALCITON", "LATICACIDVEN" in the last 168 hours.  No results found for this or any previous visit (from the past 240 hour(s)).       Radiology Studies: No results found.      Scheduled Meds:  amLODipine  10 mg Oral  Daily   apixaban  2.5 mg Oral BID   atorvastatin  40 mg Oral Daily   bisacodyl  10 mg Rectal Daily   Or   bisacodyl  10 mg Oral Daily   Chlorhexidine Gluconate Cloth  6 each Topical Daily   darbepoetin (ARANESP) injection - NON-DIALYSIS  100 mcg Subcutaneous Q Wed-1800   escitalopram  5 mg Oral Daily   feeding supplement (NEPRO CARB STEADY)  237 mL Oral BID BM   guaiFENesin  600 mg Oral BID   insulin aspart  0-6 Units Subcutaneous TID WC   isosorbide mononitrate  60 mg Oral Daily   levothyroxine  200 mcg Oral Q0600   lidocaine  1 patch Transdermal Q24H   metolazone  2.5 mg Oral Daily   multivitamin  1 tablet Oral QHS   mouth rinse  15 mL Mouth Rinse 4 times per day   sevelamer carbonate  800 mg Oral TID WC   sodium chloride flush  3 mL Intravenous Q12H   torsemide  100 mg Oral BID   Continuous Infusions:  sodium chloride Stopped (10/26/22 1934)     LOS: 17 days    Time spent: 45 minutes spent on chart review, discussion with nursing staff, consultants, updating family and interview/physical exam; more than 50% of that time was spent in counseling and/or coordination of care.    Joseph Art, DO [facility] Available via Epic secure chat 7am-7pm After these hours, please refer to coverage provider listed on [email or web address] 11/11/2022, 1:01 PM

## 2022-11-11 NOTE — Progress Notes (Signed)
Patient ID: Kayla [name], female   DOB: 24-Dec-1940, 82 y.o.   MRN: [id number] S: No new complaints.  Husband bedside.  Not eating much due to not liking food options.  Breakfast is frosted flakes.    O:BP (!) 127/40 (BP Location: Left Arm)   Pulse (!) 43   Temp 97.8 F (36.6 C) (Oral)   Resp 15   Ht 5\' 5"  (1.651 m)   Wt 98.2 kg   SpO2 95%   BMI 36.03 kg/m   Intake/Output Summary (Last 24 hours) at 11/11/2022 0743 Last data filed at 11/10/2022 2111 Gross per 24 hour  Intake 3 ml  Output 300 ml  Net -297 ml    Intake/Output: Yesterday NR/0.6L - 1 unmeasured void Gen: NAD CV:  no rub Resp: on O2, a few wheezes, no rales; normal WOB at rest Abd: +BS, soft, NT/ND Ext: 1-2+ pretibial edema with ruddy discoloartion  Recent Labs  Lab 11/05/22 0002 11/06/22 0023 11/07/22 0010 11/08/22 0007 11/09/22 0018 11/10/22 0013 11/11/22 0000  NA 132* 132* 136 136 134* 136 136  K 4.6 4.1 4.2 4.1 3.8 3.9 4.3  CL 95* 95* 97* 100 96* 97* 98  CO2 25 25 27 25 27 27 25   GLUCOSE 213* 192* 208* 139* 213* 159* 141*  BUN 77* 51* 56* 56* 57* 58* 56*  CREATININE 4.36* 3.07* 3.16* 3.27* 3.27* 3.22* 3.32*  ALBUMIN 2.2* 2.5* 2.3* 2.4* 2.4* 2.4* 2.5*  CALCIUM 8.4* 8.4* 8.5* 8.6* 8.7* 8.8* 8.9  PHOS 5.7* 4.1 4.6 4.8* 4.7* 4.3 4.2    Liver Function Tests: Recent Labs  Lab 11/09/22 0018 11/10/22 0013 11/11/22 0000  ALBUMIN 2.4* 2.4* 2.5*    No results for input(s): "LIPASE", "AMYLASE" in the last 168 hours. No results for input(s): "AMMONIA" in the last 168 hours. CBC: Recent Labs  Lab 11/05/22 0002 11/07/22 0010 11/09/22 0018 11/11/22 0000  WBC 12.0* 11.5* 10.3 8.9  HGB 7.4* 7.6* 8.5* 8.4*  HCT 25.1* 25.2* 28.4* 28.7*  MCV 98.4 98.4 100.0 100.3*  PLT 385 387 400 402*    Cardiac Enzymes: No results for input(s): "CKTOTAL", "CKMB", "CKMBINDEX", "TROPONINI" in the last 168 hours. CBG: Recent Labs  Lab 11/10/22 0612 11/10/22 1142 11/10/22 1534 11/10/22 2156 11/11/22 0628   GLUCAP 141* 155* 243* 175* 135*     Iron Studies: No results for input(s): "IRON", "TIBC", "TRANSFERRIN", "FERRITIN" in the last 72 hours. Studies/Results: No results found.  amLODipine  10 mg Oral Daily   apixaban  2.5 mg Oral BID   atorvastatin  40 mg Oral Daily   bisacodyl  10 mg Rectal Daily   Or   bisacodyl  10 mg Oral Daily   Chlorhexidine Gluconate Cloth  6 each Topical Daily   darbepoetin (ARANESP) injection - NON-DIALYSIS  100 mcg Subcutaneous Q Wed-1800   escitalopram  5 mg Oral Daily   feeding supplement (NEPRO CARB STEADY)  237 mL Oral BID BM   guaiFENesin  600 mg Oral BID   insulin aspart  0-6 Units Subcutaneous TID WC   isosorbide mononitrate  60 mg Oral Daily   levothyroxine  200 mcg Oral Q0600   lidocaine  1 patch Transdermal Q24H   metolazone  2.5 mg Oral Daily   multivitamin  1 tablet Oral QHS   mouth rinse  15 mL Mouth Rinse 4 times per day   sevelamer carbonate  800 mg Oral TID WC   sodium chloride flush  3 mL Intravenous Q12H   torsemide  100 mg Oral BID    BMET    Component Value Date/Time   NA 136 11/11/2022 0000   NA 140 02/16/2022 1008   K 4.3 11/11/2022 0000   CL 98 11/11/2022 0000   CO2 25 11/11/2022 0000   GLUCOSE 141 (H) 11/11/2022 0000   BUN 56 (H) 11/11/2022 0000   BUN 47 (H) 02/16/2022 1008   CREATININE 3.32 (H) 11/11/2022 0000   CREATININE 2.10 (H) 10/07/2022 0939   CALCIUM 8.9 11/11/2022 0000   CALCIUM 9.9 06/04/2008 2240   GFRNONAA 13 (L) 11/11/2022 0000   GFRAA 45 (L) 02/16/2020 1124   CBC    Component Value Date/Time   WBC 8.9 11/11/2022 0000   RBC 2.86 (L) 11/11/2022 0000   HGB 8.4 (L) 11/11/2022 0000   HGB 11.3 01/30/2021 1154   HCT 28.7 (L) 11/11/2022 0000   HCT 35.9 01/30/2021 1154   PLT 402 (H) 11/11/2022 0000   PLT 389 01/30/2021 1154   MCV 100.3 (H) 11/11/2022 0000   MCV 91 01/30/2021 1154   MCH 29.4 11/11/2022 0000   MCHC 29.3 (L) 11/11/2022 0000   RDW 20.2 (H) 11/11/2022 0000   RDW 13.4 01/30/2021 1154    LYMPHSABS 1.4 10/25/2022 0730   MONOABS 1.3 (H) 10/25/2022 0730   EOSABS 0.1 10/25/2022 0730   BASOSABS 0.0 10/25/2022 0730     Assessment/Plan:   Acute kidney injury on CKD stage IV likely due to CHF exacerbation/fluid overload/bradycardia leading to reduced renal perfusion.  B/L Cr around 2, followed by Dr. Noah Charon as an outpatient. She had hyperkalemia, anasarca and did not respond with IV diuretics.  Also had features of uremic encephalopathy including tremor, AMS. -temp HD catheter placed on 6/23, started CRRT 6/23. CRRT off on 6/24 given access issues/alarms. RIJ temp line removed 6/26. Uremic symptoms improved at that time   -Cr down to 2.95 but rose to 3.83 and up to 4.36.  Resumed her outpatient torsemide dose of 40 mg daily but worsening UOP and BUN/Cr.    - s/p TDC placement by IR 11/04/22 and had 1 session of IHD 11/05/22 and tolerated it well with UF of 1 liter.  Cr improved from 4.36 to 3.07 but had slowly been climbing but is now stable.  However, largest concern is fairly low UOP with high dose diuretic. Given ongoing difficulty with volume management will do dialysis today and initiate process to pursue outpt AKI dialysis.  Renal navigator has been alerted.      -She is interested in home therapies down the road if/when indicated.  She is a poor longterm HD candidate given her advanced age, multiple co-morbidities, and poor functional and nutritional status.   -normal renal artery duplex; no evidence of RAS -daily labs, strict I/O -cont to hold off on discharge for now pending arrangement of outpatient HD    Bradycardia: Seen by cardiology during admit. H/o PPM extraction secondary to bacteremia (Sep 07, 2022).  Off dopamine gtt, HR stable. Avoid beta blockers   Acute on chronic respiratory failure/respiratory distress with hypoxia: lasix as above. CXR 6/25 revealed improvement in opacities. On 3L [place] at baseline @ home -improving, UF as above   Sepsis due to possible  pneumonia: s/p  antibiotics per primary team.   Anemia: transfuse for Hgb <7. ESA started 6/26, increased dose of aranesp to 100 mcg 7/3. Feraheme x 1 dose 6/28.   CKD MBD: PO4 high  and now normalized on renvela, c/w renal diet  HTN: remain hypertensive.  Increased imdur to 60.  UF with HD.    Estill Bakes MD [facility] Pager [phone]

## 2022-11-11 NOTE — TOC Progression Note (Signed)
Transition of Care [facility]) - Progression Note    Patient Details  Name: Kayla [name] MRN: [id number] Date of Birth: 05-Sep-1940  Transition of Care [facility]) CM/SW Contact  Esteban Kobashigawa A [place], [place] Phone Number: 11/11/2022, 3:22 PM  Clinical Narrative:     CSW was informed by French Ana that pt was a candidate for HD. CSW followed up with Marcelino Duster at [facility], pt's SNF choice, regarding facility that they use for outpatient dialysis.   CSW has not heard back from facility  regarding question. CSW to update French Ana once facility name has been shared with CSW.   TOC will continue to follow.  Expected Discharge Plan: Skilled Nursing Facility Barriers to Discharge: Continued Medical Work up  Expected Discharge Plan and Services   Discharge Planning Services: CM Consult Post Acute Care Choice: Home Health Living arrangements for the past 2 months: Single Family Home                 DME Arranged: Wheelchair manual DME Agency: [employer] Date DME Agency Contacted: 11/02/22 Time DME Agency Contacted: [phone] Representative spoke with at DME Agency: Zack HH Arranged: RN, PT HH Agency: [facility] Date [facility] Agency Contacted: 10/29/22 Time HH Agency Contacted: 1525 Representative spoke with at [facility] Agency: Haywood Lasso   Social Determinants of Health (SDOH) Interventions SDOH Screenings   Food Insecurity: No Food Insecurity (11/04/2022)  Housing: Patient Declined (11/04/2022)  Transportation Needs: No Transportation Needs (11/04/2022)  Utilities: Not At Risk (11/04/2022)  Alcohol Screen: Low Risk  (01/31/2021)  Depression (PHQ2-9): Low Risk  (10/15/2022)  Financial Resource Strain: Low Risk  (01/31/2021)  Physical Activity: Insufficiently Active (09/30/2021)  Social Connections: Socially Integrated (09/30/2021)  Stress: No Stress Concern Present (09/30/2021)  Tobacco Use: Medium Risk (11/10/2022)    Readmission Risk Interventions    10/01/2022    3:52 PM 01/22/2022    2:43 PM 07/23/2021   12:10 PM   Readmission Risk Prevention Plan  Transportation Screening Complete Complete Complete  PCP or Specialist Appt within 3-5 Days Complete Complete   HRI or Home Care Consult  Complete   Social Work Consult for Recovery Care Planning/Counseling Complete Complete   Palliative Care Screening Not Applicable Not Applicable   Medication Review Oceanographer) Referral to Pharmacy Complete Complete  HRI or Home Care Consult   Complete  SW Recovery Care/Counseling Consult   Complete  Palliative Care Screening   Not Applicable  Skilled Nursing Facility   Complete

## 2022-11-11 NOTE — Progress Notes (Signed)
Nutrition Follow-up  DOCUMENTATION CODES:   Not applicable  INTERVENTION:   - Continue renal MVI daily   - Pt is at high risk for developing malnutrition. Given variable PO intake and increased nutrient needs, liberalize diet back to Regular to provide additional food options at meal times. Potassium has been WNL since 6/24, and pt has been started on phosphorus binders for previously elevated phosphorus which is now WNL.   - d/c Nepro Shakes as pt is refusing  - Trial Magic Cup TID with meals, each supplement provides 290 kcal and 9 grams of protein  NUTRITION DIAGNOSIS:   Increased nutrient needs related to acute illness, catabolic illness as evidenced by estimated needs.  Ongoing, being addressed via diet liberalization and oral nutrition supplements  GOAL:   Patient will meet greater than or equal to 90% of their needs  Progressing  MONITOR:   PO intake, Supplement acceptance, Labs, Weight trends, Skin, I & O's  REASON FOR ASSESSMENT:   Consult Assessment of nutrition requirement/status  ASSESSMENT:   82 yo female admitted with acute on chronic respiratory failure with mixed acidosis, acute on chronic HFpEF, AKI on CKD 4 requiring CRRT, sepsis. PMH includes, CAD, CHF, CKD, COPD, DM, GERD, OSA, multiple toe amputations  06/24 - CRRT d/c  Per Nephrology note, plan is to do dialysis today and initiate process to pursue outpatient AKI dialysis. Last HD was 7/04 with 1000 ml net UF. Post-HD weight was 102.8 kg.  Attempted to speak with pt, but pt in HD at time of RD visit.  Pt with mild pitting generalized edema, mild pitting edema to BUE, and moderate pitting edema to BLE.  Admit weight: 110.2 kg Current weight: 98.2 kg  Meal Completion: 0-100%  Medications reviewed and include: dulcolax, aranesp weekly, Nepro Shake BID, SSI, renavit, renvela 800 mg TID with meals, torsemide 100 mg BID  Labs reviewed: BUN 56, creatinine 3.32, hemoglobin 8.4 CBG's: 135-243 x 24  hours  UOP: 600 ml x 24 hours  Diet Order:   Diet Order             Diet regular Room service appropriate? Yes with Assist; Fluid consistency: Thin; Fluid restriction: 1200 mL Fluid  Diet effective now                   EDUCATION NEEDS:   Education needs have been addressed  Skin:  Skin Assessment: Skin Integrity Issues: Other: non-pressure wounds to LLE, skin tear RUE, non-pressure wound to R hand, skin tear LUE, non-pressure wound to buttocks  Last BM:  11/09/22 large type 6  Height:   Ht Readings from Last 1 Encounters:  10/25/22 5\' 5"  (1.651 m)    Weight:   Wt Readings from Last 1 Encounters:  11/11/22 98.2 kg    BMI:  Body mass index is 36.03 kg/m.  Estimated Nutritional Needs:   Kcal:  1700-1900 kcals  Protein:  85-100 grams  Fluid:  1L plus UOP    Mertie Clause, MS, RD, LDN Inpatient Clinical Dietitian Please see AMiON for contact information.

## 2022-11-11 NOTE — Progress Notes (Signed)
PT Cancellation Note  Patient Details Name: Kayla [name] MRN: [id number] DOB: 1941/03/09   Cancelled Treatment:    Reason Eval/Treat Not Completed: (P) Patient at procedure or test/unavailable (Pt in HD, will continue to follow per PT POC.)   Johny Shock 11/11/2022, 3:57 PM

## 2022-11-11 NOTE — Plan of Care (Signed)

## 2022-11-11 NOTE — TOC Progression Note (Signed)
Transition of Care [facility]) - Progression Note    Patient Details  Name: Kayla [name] MRN: [id number] Date of Birth: 23-Jun-1940  Transition of Care [facility]) CM/SW Contact  Jeorgia Helming A [place], [place] Phone Number: 11/11/2022, 4:01 PM  Clinical Narrative:     CSW met with pt's husband to discuss discharge plan. He stated that he was still deciding if pt should go to rehab because he would prefer not to send her to a facility.   He said that he was just informed regarding her need for dialysis going forward and if they decide to take pt home, he would need assistance with transportation as he would be physically unable to take pt back and forth in the car. He said he can provider 24/7 care at home and would be ok with her receiving [facility] services, but is concerned about the HD transportation.   CSW provided him with his insurance contact information and he said he would reach out to his company and find out if his insurance plan had transportation assistance.   He said that pt had been to [facility] recently for about 15 days so she had 5 days left that insurance would cover for short term rehab. He said that he wanted to talk it over with his wife more but that he may be leaning towards going to rehab and taking it from there.   CSW to follow up with family regarding decision for SNF versus HH PT/OT.  TOC will continue to follow.     Expected Discharge Plan: Skilled Nursing Facility Barriers to Discharge: Continued Medical Work up  Expected Discharge Plan and Services   Discharge Planning Services: CM Consult Post Acute Care Choice: Home Health Living arrangements for the past 2 months: Single Family Home                 DME Arranged: Wheelchair manual DME Agency: [employer] Date DME Agency Contacted: 11/02/22 Time DME Agency Contacted: 1444 Representative spoke with at DME Agency: Zack HH Arranged: RN, PT HH Agency: [facility] Date [facility] Agency Contacted: 10/29/22 Time HH Agency  Contacted: 1525 Representative spoke with at [facility] Agency: Haywood Lasso   Social Determinants of Health (SDOH) Interventions SDOH Screenings   Food Insecurity: No Food Insecurity (11/04/2022)  Housing: Patient Declined (11/04/2022)  Transportation Needs: No Transportation Needs (11/04/2022)  Utilities: Not At Risk (11/04/2022)  Alcohol Screen: Low Risk  (01/31/2021)  Depression (PHQ2-9): Low Risk  (10/15/2022)  Financial Resource Strain: Low Risk  (01/31/2021)  Physical Activity: Insufficiently Active (09/30/2021)  Social Connections: Socially Integrated (09/30/2021)  Stress: No Stress Concern Present (09/30/2021)  Tobacco Use: Medium Risk (11/10/2022)    Readmission Risk Interventions    10/01/2022    3:52 PM 01/22/2022    2:43 PM 07/23/2021   12:10 PM  Readmission Risk Prevention Plan  Transportation Screening Complete Complete Complete  PCP or Specialist Appt within 3-5 Days Complete Complete   HRI or Home Care Consult  Complete   Social Work Consult for Recovery Care Planning/Counseling Complete Complete   Palliative Care Screening Not Applicable Not Applicable   Medication Review Oceanographer) Referral to Pharmacy Complete Complete  HRI or Home Care Consult   Complete  SW Recovery Care/Counseling Consult   Complete  Palliative Care Screening   Not Applicable  Skilled Nursing Facility   Complete

## 2022-11-11 NOTE — Progress Notes (Signed)
   11/11/22 1524  Vitals  Temp 97.8 F (36.6 C)  Pulse Rate (!) 47  Resp 18  BP (!) 124/38  SpO2 99 %  O2 Device Nasal Cannula  Oxygen Therapy  O2 Flow Rate (L/min) 2 L/min  Patient Activity (if Appropriate) In bed  Pulse Oximetry Type Continuous  Post Treatment  Dialyzer Clearance Clear  Duration of HD Treatment -hour(s) 3 hour(s)  Hemodialysis Intake (mL) 0 mL  Liters Processed 68  Fluid Removed (mL) 2.5 mL  Tolerated HD Treatment Yes   Received patient in bed to unit.  Alert and oriented.  Informed consent signed and in chart.   TX duration: 3  Patient tolerated well.  Transported back to the room  Alert, without acute distress.  Hand-off given to patient's nurse.   Access used: Yes Access issues: No  Total UF removed: 2500 Medication(s) given: See MAR Post HD VS: See Above Grid Post HD weight: 94.9 kg   [facility]

## 2022-11-12 DIAGNOSIS — J9601 Acute respiratory failure with hypoxia: Secondary | ICD-10-CM | POA: Diagnosis not present

## 2022-11-12 DIAGNOSIS — J9602 Acute respiratory failure with hypercapnia: Secondary | ICD-10-CM | POA: Diagnosis not present

## 2022-11-12 LAB — GLUCOSE, CAPILLARY
Glucose-Capillary: 149 mg/dL — ABNORMAL HIGH (ref 70–99)
Glucose-Capillary: 214 mg/dL — ABNORMAL HIGH (ref 70–99)
Glucose-Capillary: 118 mg/dL — ABNORMAL HIGH (ref 70–99)
Glucose-Capillary: 149 mg/dL — ABNORMAL HIGH (ref 70–99)

## 2022-11-12 LAB — RENAL FUNCTION PANEL
Albumin: 2.5 g/dL — ABNORMAL LOW (ref 3.5–5.0)
Anion gap: 10 (ref 5–15)
BUN: 29 mg/dL — ABNORMAL HIGH (ref 8–23)
CO2: 28 mmol/L (ref 22–32)
Calcium: 8.5 mg/dL — ABNORMAL LOW (ref 8.9–10.3)
Chloride: 96 mmol/L — ABNORMAL LOW (ref 98–111)
Creatinine, Ser: 2.52 mg/dL — ABNORMAL HIGH (ref 0.44–1.00)
GFR, Estimated: 19 mL/min — ABNORMAL LOW (ref >60)
Glucose, Bld: 202 mg/dL — ABNORMAL HIGH (ref 70–99)
Phosphorus: 3.2 mg/dL (ref 2.5–4.6)
Potassium: 4.1 mmol/L (ref 3.5–5.1)
Sodium: 134 mmol/L — ABNORMAL LOW (ref 135–145)

## 2022-11-12 LAB — CBC
HCT: 29 % — ABNORMAL LOW (ref 36.0–46.0)
Hemoglobin: 8.5 g/dL — ABNORMAL LOW (ref 12.0–15.0)
MCH: 28.5 pg (ref 26.0–34.0)
MCHC: 29.3 g/dL — ABNORMAL LOW (ref 30.0–36.0)
MCV: 97.3 fL (ref 80.0–100.0)
Platelets: 402 10*3/uL — ABNORMAL HIGH (ref 150–400)
RBC: 2.98 MIL/uL — ABNORMAL LOW (ref 3.87–5.11)
RDW: 19.8 % — ABNORMAL HIGH (ref 11.5–15.5)
WBC: 10 10*3/uL (ref 4.0–10.5)
nRBC: 0 % (ref 0.0–0.2)

## 2022-11-12 LAB — MAGNESIUM: Magnesium: 1.6 mg/dL — ABNORMAL LOW (ref 1.7–2.4)

## 2022-11-12 MED ORDER — MAGNESIUM SULFATE 2 GM/50ML IV SOLN
2.0000 g | Freq: Once | INTRAVENOUS | Status: AC
  Administered 2022-11-12: 2 g via INTRAVENOUS
  Filled 2022-11-12: qty 50

## 2022-11-12 MED ORDER — TRAZODONE HCL 50 MG PO TABS
50.0000 mg | ORAL_TABLET | Freq: Every evening | ORAL | Status: DC | PRN
  Administered 2022-11-12 – 2022-11-13 (×2): 50 mg via ORAL
  Filled 2022-11-12 (×2): qty 1

## 2022-11-12 MED ORDER — INSULIN ASPART 100 UNIT/ML IJ SOLN
2.0000 [IU] | Freq: Three times a day (TID) | INTRAMUSCULAR | Status: DC
  Administered 2022-11-12 – 2022-11-14 (×3): 2 [IU] via SUBCUTANEOUS

## 2022-11-12 NOTE — TOC Progression Note (Signed)
Transition of Care [facility]) - Progression Note    Patient Details  Name: Kayla [name] MRN: [id number] Date of Birth: [birth year]  Transition of Care [facility]) CM/SW Contact  Inis Sizer, LCSW Phone Number: 11/12/2022, 9:06 AM  Clinical Narrative:    CSW spoke with patient's husband Fayrene Fearing who states he will take the patient home with home health services. Fayrene Fearing states he spoke with representative at [facility] who states the policy has transportation benefits (24 one way trips). Fayrene Fearing states he prefers outpatient HD to be at [facility] [address]. CSW informed Fayrene Fearing that CSW would need to coordinate outpatient HD through [facility] Nav[facility].  CSW spoke with French Ana to inform her of information - French Ana to submit outpatient HD referral today.   Expected Discharge Plan: Skilled Nursing Facility Barriers to Discharge: Continued Medical Work up  Expected Discharge Plan and Services   Discharge Planning Services: CM Consult Post Acute Care Choice: Home Health Living arrangements for the past 2 months: Single Family Home                 DME Arranged: Wheelchair manual DME Agency: [employer] Date DME Agency Contacted: 11/02/22 Time DME Agency Contacted: 1444 Representative spoke with at DME Agency: Zack HH Arranged: RN, PT HH Agency: [facility] Date [facility] Agency Contacted: 10/29/22 Time HH Agency Contacted: 1525 Representative spoke with at [facility] Agency: Haywood Lasso   Social Determinants of Health (SDOH) Interventions SDOH Screenings   Food Insecurity: No Food Insecurity (11/04/2022)  Housing: Patient Declined (11/04/2022)  Transportation Needs: No Transportation Needs (11/04/2022)  Utilities: Not At Risk (11/04/2022)  Alcohol Screen: Low Risk  (01/31/2021)  Depression (PHQ2-9): Low Risk  (10/15/2022)  Financial Resource Strain: Low Risk  (01/31/2021)  Physical Activity: Insufficiently Active (09/30/2021)  Social Connections: Socially Integrated (09/30/2021)  Stress: No Stress Concern Present  (09/30/2021)  Tobacco Use: Medium Risk (11/10/2022)    Readmission Risk Interventions    10/01/2022    3:52 PM 01/22/2022    2:43 PM 07/23/2021   12:10 PM  Readmission Risk Prevention Plan  Transportation Screening Complete Complete Complete  PCP or Specialist Appt within 3-5 Days Complete Complete   HRI or Home Care Consult  Complete   Social Work Consult for Recovery Care Planning/Counseling Complete Complete   Palliative Care Screening Not Applicable Not Applicable   Medication Review Oceanographer) Referral to Pharmacy Complete Complete  HRI or Home Care Consult   Complete  SW Recovery Care/Counseling Consult   Complete  Palliative Care Screening   Not Applicable  Skilled Nursing Facility   Complete

## 2022-11-12 NOTE — Progress Notes (Addendum)
Patient ID: Kayla [name], female   DOB: [birth year], 82 y.o.   MRN: [id number] S: No new complaints and says actually is feeling improved after HD yesterday.  Husband bedside.   UOP yesterday and 2.5L UF with HD.   O:BP 127/65 (BP Location: Left Arm)   Pulse (!) 45   Temp 97.7 F (36.5 C) (Oral)   Resp 20   Ht 5\' 5"  (1.651 m)   Wt 98.2 kg   SpO2 98%   BMI 36.03 kg/m   Intake/Output Summary (Last 24 hours) at 11/12/2022 0856 Last data filed at 11/12/2022 4098 Gross per 24 hour  Intake 120 ml  Output 852.5 ml  Net -732.5 ml   Intake/Output: Gen: NAD - looks less fatigued and more comfortable than days prior CV:  no rub Resp: on O2, a few wheezes, no rales; normal WOB at rest Abd: +BS, soft, NT/ND Ext: 1-2+ pretibial edema --> unchanged but she's been up in chair all night.  Distinct line of demarcation about 2/3 up legs.   Recent Labs  Lab 11/06/22 0023 11/07/22 0010 11/08/22 0007 11/09/22 0018 11/10/22 0013 11/11/22 0000 11/12/22 0033  NA 132* 136 136 134* 136 136 134*  K 4.1 4.2 4.1 3.8 3.9 4.3 4.1  CL 95* 97* 100 96* 97* 98 96*  CO2 25 27 25 27 27 25 28   GLUCOSE 192* 208* 139* 213* 159* 141* 202*  BUN 51* 56* 56* 57* 58* 56* 29*  CREATININE 3.07* 3.16* 3.27* 3.27* 3.22* 3.32* 2.52*  ALBUMIN 2.5* 2.3* 2.4* 2.4* 2.4* 2.5* 2.5*  CALCIUM 8.4* 8.5* 8.6* 8.7* 8.8* 8.9 8.5*  PHOS 4.1 4.6 4.8* 4.7* 4.3 4.2 3.2   Liver Function Tests: Recent Labs  Lab 11/10/22 0013 11/11/22 0000 11/12/22 0033  ALBUMIN 2.4* 2.5* 2.5*   No results for input(s): "LIPASE", "AMYLASE" in the last 168 hours. No results for input(s): "AMMONIA" in the last 168 hours. CBC: Recent Labs  Lab 11/07/22 0010 11/09/22 0018 11/11/22 0000 11/12/22 0033  WBC 11.5* 10.3 8.9 10.0  HGB 7.6* 8.5* 8.4* 8.5*  HCT 25.2* 28.4* 28.7* 29.0*  MCV 98.4 100.0 100.3* 97.3  PLT 387 400 402* 402*   Cardiac Enzymes: No results for input(s): "CKTOTAL", "CKMB", "CKMBINDEX", "TROPONINI" in the last 168  hours. CBG: Recent Labs  Lab 11/11/22 0628 11/11/22 1110 11/11/22 1632 11/11/22 2121 11/12/22 0610  GLUCAP 135* 238* 155* 305* 149*    Iron Studies: No results for input(s): "IRON", "TIBC", "TRANSFERRIN", "FERRITIN" in the last 72 hours. Studies/Results: No results found.  amLODipine  10 mg Oral Daily   apixaban  2.5 mg Oral BID   atorvastatin  40 mg Oral Daily   bisacodyl  10 mg Rectal Daily   Or   bisacodyl  10 mg Oral Daily   Chlorhexidine Gluconate Cloth  6 each Topical Daily   darbepoetin (ARANESP) injection - NON-DIALYSIS  100 mcg Subcutaneous Q Wed-1800   escitalopram  5 mg Oral Daily   guaiFENesin  600 mg Oral BID   insulin aspart  0-6 Units Subcutaneous TID WC   insulin aspart  2 Units Subcutaneous TID WC   isosorbide mononitrate  60 mg Oral Daily   levothyroxine  200 mcg Oral Q0600   lidocaine  1 patch Transdermal Q24H   metolazone  2.5 mg Oral Daily   multivitamin  1 tablet Oral QHS   mouth rinse  15 mL Mouth Rinse 4 times per day   sevelamer carbonate  800 mg Oral TID  WC   sodium chloride flush  3 mL Intravenous Q12H   torsemide  100 mg Oral BID    BMET    Component Value Date/Time   NA 134 (L) 11/12/2022 0033   NA 140 02/16/2022 1008   K 4.1 11/12/2022 0033   CL 96 (L) 11/12/2022 0033   CO2 28 11/12/2022 0033   GLUCOSE 202 (H) 11/12/2022 0033   BUN 29 (H) 11/12/2022 0033   BUN 47 (H) 02/16/2022 1008   CREATININE 2.52 (H) 11/12/2022 0033   CREATININE 2.10 (H) 10/07/2022 0939   CALCIUM 8.5 (L) 11/12/2022 0033   CALCIUM 9.9 06/04/2008 2240   GFRNONAA 19 (L) 11/12/2022 0033   GFRAA 45 (L) 02/16/2020 1124   CBC    Component Value Date/Time   WBC 10.0 11/12/2022 0033   RBC 2.98 (L) 11/12/2022 0033   HGB 8.5 (L) 11/12/2022 0033   HGB 11.3 01/30/2021 1154   HCT 29.0 (L) 11/12/2022 0033   HCT 35.9 01/30/2021 1154   PLT 402 (H) 11/12/2022 0033   PLT 389 01/30/2021 1154   MCV 97.3 11/12/2022 0033   MCV 91 01/30/2021 1154   MCH 28.5 11/12/2022  0033   MCHC 29.3 (L) 11/12/2022 0033   RDW 19.8 (H) 11/12/2022 0033   RDW 13.4 01/30/2021 1154   LYMPHSABS 1.4 10/25/2022 0730   MONOABS 1.3 (H) 10/25/2022 0730   EOSABS 0.1 10/25/2022 0730   BASOSABS 0.0 10/25/2022 0730     Assessment/Plan:   Acute kidney injury on CKD stage IV likely due to CHF exacerbation/fluid overload/bradycardia leading to reduced renal perfusion.  B/L Cr around 2, followed by Dr. Noah Charon as an outpatient. She had hyperkalemia, anasarca and did not respond with IV diuretics.  Also had features of uremic encephalopathy including tremor, AMS. -temp HD catheter placed on 6/23, started CRRT 6/23. CRRT off on 6/24 given access issues/alarms. RIJ temp line removed 6/26. Uremic symptoms improved at that time   -Cr down to 2.95 but rose to 3.83 and up to 4.36.  Resumed her outpatient torsemide dose of 40 mg daily but worsening UOP and BUN/Cr.    - s/p TDC placement by IR 11/04/22 and had 1 session of IHD 11/05/22 and tolerated it well with UF of 1 liter.  Cr improved from 4.36 to 3.07 but had slowly been climbing but is now stable.  However, largest concern is fairly low UOP with high dose diuretic. Given ongoing difficulty with volume management resumed HD 7/10 and initiated process to pursue outpt AKI dialysis.  Renal navigator has been alerted.  Plan next HD tomorrow.  Cont to monitor for signs of renal recovery but given protracted course may not end up recovering; considering AKI for now.   -She is interested in home therapies down the road if/when indicated.  She is a poor longterm HD candidate given her advanced age, multiple co-morbidities, and poor functional and nutritional status but she wishes to pursue   -normal renal artery duplex; no evidence of RAS -daily labs, strict I/O -cont to hold off on discharge for now pending arrangement of outpatient HD    Bradycardia: Seen by cardiology during admit. H/o PPM extraction secondary to bacteremia (Sep 07, 2022).  Off  dopamine gtt, HR stable. Avoid beta blockers   Acute on chronic respiratory failure/respiratory distress with hypoxia: lasix as above. CXR 6/25 revealed improvement in opacities. On 3L [place] at baseline @ home -improving, cont diuretics and HD UF -TED hose recommended  -low sodium, fluid restricted  diet   Sepsis due to possible pneumonia: s/p  antibiotics per primary team.   Anemia: transfuse for Hgb <7. ESA started 6/26, increased dose of aranesp to 100 mcg 7/3. Feraheme x 1 dose 6/28.   CKD MBD: PO4 high  and now normalized on renvela, c/w renal diet  HTN: well controlled now.     Estill Bakes MD [facility] Pager [phone]

## 2022-11-12 NOTE — Progress Notes (Signed)
PT Cancellation Note  Patient Details Name: Kayla [name] MRN: [id number] DOB: 11/22/1940   Cancelled Treatment:    Reason Eval/Treat Not Completed: (P) Patient declined, no reason specified (Pt stating that her stomach hurts and declines mobility today. Will plan to check back tomorrow.)   Johny Shock 11/12/2022, 2:59 PM

## 2022-11-12 NOTE — Progress Notes (Signed)
Pt states she wears a CPAP at home, but does not want to be charged for ours in the hospital. She said she would be fine without her CPAP until her family member brings hers from home. Pt knows to notify RN to call RT if she changes her mind.

## 2022-11-12 NOTE — Progress Notes (Signed)
PROGRESS NOTE    Kayla BETTENHAUSEN  AVW:[id number] DOB: 18-Sep-1940 DOA: 10/25/2022 PCP: Ailene Ravel, MD    Brief Narrative:  82 year old Caucasian female, significantly obese, with recent history of MRSA bacteremia/infective endocarditis for which patient was on antibiotics for a long time.  Other medical history includes coronary artery disease, carotid artery disease, chronic kidney disease stage IV, COPD, diabetes mellitus type 2, hypertension, hyperlipidemia, hypothyroidism and documented congestive heart failure.  Recent echocardiograms revealed normal left ventricular ejection fraction, without mention of diastolic dysfunction.  Patient was admitted with pulmonary edema, acute kidney injury on chronic kidney disease stage IV with oliguria versus anuria, requiring CRRT for about 48 hours and now HD.     Assessment and Plan: Acute kidney injury on chronic kidney disease stage IV/pulmonary edema: -Nephrology has changed diuresis from lasix 80 mg bid to torsemide 40 mg daily, but creatinine continues to rise. -Patient was on CRRT for about 48 hours. She states that she would agree to HD.  -starting HD on 7/10:Given ongoing difficulty with volume management will do dialysis  and initiate process to pursue outpt AKI dialysis. Renal navigator has been alerted.    Acute on chronic hypoxic/hypercapnic respiratory failure: -Patient has been on oxygen for about 1 year (2 L/min via nasal cannula). -Etiology is likely multifactorial. -Pt complains of increasing work of breathing. Likely due to worsening renal statu -Discussed need to comply with CPAP management and, treatment of OSA/likely undiagnosed OHS.   COPD: -Seems compensated. -Continue current regimen.   Left lateral chest wall pain: -X-ray of the ribs revealed minimally displaced posterolateral left seventh rib fracture.  -Adequate pain control. -Lidocaine patch to left lateral rib cage area. -Supportive management.    Paroxysmal atrial fibrillation: -Controlled heart rate. -Patient is on apixaban 2.5 Mg p.o. twice daily.   Anemia of chronic kidney disease: -Patient has received IV iron. -Patient is on Aranesp every 2 weeks.   Hypertension: -Continue to optimize.   Diabetes mellitus:  -SSI   Hyperlipidemia: -Continue atorvastatin.   Possible community acquired pneumonia: -Findings/events may be suggestive of pulmonary edema. -abx stopped     DVT prophylaxis: apixaban (ELIQUIS) tablet 2.5 mg Start: 10/29/22 1100 SCDs Start: 10/25/22 0958 apixaban (ELIQUIS) tablet 2.5 mg    Code Status: DNR Family Communication: at bedside-- husband may want to take home and not go to SNF-- TOC alerted   Disposition Plan:  Level of care: Progressive Status is: Inpatient Remains inpatient appropriate     Consultants:  PCCM renal   Subjective: No SOB, no CP-- did not sleep well last night  Objective: Vitals:   11/12/22 0611 11/12/22 0751 11/12/22 0932 11/12/22 1114  BP: (!) 122/41 127/65  (!) 132/43  Pulse: (!) 45 (!) 45  (!) 48  Resp:  20 15 15   Temp: 98 F (36.7 C) 97.7 F (36.5 C)  98.1 F (36.7 C)  TempSrc: Oral Oral  Oral  SpO2: 98% 98%    Weight:   94.4 kg   Height:        Intake/Output Summary (Last 24 hours) at 11/12/2022 1311 Last data filed at 11/12/2022 0945 Gross per 24 hour  Intake 26.01 ml  Output 252.5 ml  Net -226.49 ml   Filed Weights   11/05/22 1155 11/11/22 0500 11/12/22 0932  Weight: 102.8 kg 98.2 kg 94.4 kg    Examination:   General: Appearance:    Obese female in no acute distress     Lungs:     respirations unlabored  Heart:    Bradycardic.       Neurologic:   Awake, alert       Data Reviewed: I have personally reviewed following labs and imaging studies  CBC: Recent Labs  Lab 11/07/22 0010 11/09/22 0018 11/11/22 0000 11/12/22 0033  WBC 11.5* 10.3 8.9 10.0  HGB 7.6* 8.5* 8.4* 8.5*  HCT 25.2* 28.4* 28.7* 29.0*  MCV 98.4 100.0 100.3*  97.3  PLT 387 400 402* 402*   Basic Metabolic Panel: Recent Labs  Lab 11/08/22 0007 11/09/22 0018 11/10/22 0013 11/11/22 0000 11/12/22 0033  NA 136 134* 136 136 134*  K 4.1 3.8 3.9 4.3 4.1  CL 100 96* 97* 98 96*  CO2 25 27 27 25 28   GLUCOSE 139* 213* 159* 141* 202*  BUN 56* 57* 58* 56* 29*  CREATININE 3.27* 3.27* 3.22* 3.32* 2.52*  CALCIUM 8.6* 8.7* 8.8* 8.9 8.5*  MG 1.9 1.9 1.8 1.9 1.6*  PHOS 4.8* 4.7* 4.3 4.2 3.2   GFR: Estimated Creatinine Clearance: 19.9 mL/min (A) (by C-G formula based on SCr of 2.52 mg/dL (H)). Liver Function Tests: Recent Labs  Lab 11/08/22 0007 11/09/22 0018 11/10/22 0013 11/11/22 0000 11/12/22 0033  ALBUMIN 2.4* 2.4* 2.4* 2.5* 2.5*   No results for input(s): "LIPASE", "AMYLASE" in the last 168 hours. No results for input(s): "AMMONIA" in the last 168 hours. Coagulation Profile: No results for input(s): "INR", "PROTIME" in the last 168 hours. Cardiac Enzymes: No results for input(s): "CKTOTAL", "CKMB", "CKMBINDEX", "TROPONINI" in the last 168 hours. BNP (last 3 results) Recent Labs    02/16/22 1008  PROBNP 699   HbA1C: No results for input(s): "HGBA1C" in the last 72 hours. CBG: Recent Labs  Lab 11/11/22 1110 11/11/22 1632 11/11/22 2121 11/12/22 0610 11/12/22 1114  GLUCAP 238* 155* 305* 149* 214*   Lipid Profile: No results for input(s): "CHOL", "HDL", "LDLCALC", "TRIG", "CHOLHDL", "LDLDIRECT" in the last 72 hours. Thyroid Function Tests: No results for input(s): "TSH", "T4TOTAL", "FREET4", "T3FREE", "THYROIDAB" in the last 72 hours. Anemia Panel: No results for input(s): "VITAMINB12", "FOLATE", "FERRITIN", "TIBC", "IRON", "RETICCTPCT" in the last 72 hours. Sepsis Labs: No results for input(s): "PROCALCITON", "LATICACIDVEN" in the last 168 hours.  No results found for this or any previous visit (from the past 240 hour(s)).       Radiology Studies: No results found.      Scheduled Meds:  amLODipine  10 mg Oral  Daily   apixaban  2.5 mg Oral BID   atorvastatin  40 mg Oral Daily   bisacodyl  10 mg Rectal Daily   Or   bisacodyl  10 mg Oral Daily   Chlorhexidine Gluconate Cloth  6 each Topical Daily   darbepoetin (ARANESP) injection - NON-DIALYSIS  100 mcg Subcutaneous Q Wed-1800   escitalopram  5 mg Oral Daily   guaiFENesin  600 mg Oral BID   insulin aspart  0-6 Units Subcutaneous TID WC   insulin aspart  2 Units Subcutaneous TID WC   isosorbide mononitrate  60 mg Oral Daily   levothyroxine  200 mcg Oral Q0600   lidocaine  1 patch Transdermal Q24H   metolazone  2.5 mg Oral Daily   multivitamin  1 tablet Oral QHS   mouth rinse  15 mL Mouth Rinse 4 times per day   sevelamer carbonate  800 mg Oral TID WC   sodium chloride flush  3 mL Intravenous Q12H   torsemide  100 mg Oral BID   Continuous Infusions:  sodium chloride  Stopped (10/26/22 1934)     LOS: 18 days    Time spent: 45 minutes spent on chart review, discussion with nursing staff, consultants, updating family and interview/physical exam; more than 50% of that time was spent in counseling and/or coordination of care.    Joseph Art, DO [facility] Available via Epic secure chat 7am-7pm After these hours, please refer to coverage provider listed on [email or web address] 11/12/2022, 1:11 PM

## 2022-11-12 NOTE — Progress Notes (Signed)
OT Cancellation Note  Patient Details Name: Kayla [name] MRN: [id number] DOB: [birth year]   Cancelled Treatment:    Reason Eval/Treat Not Completed: Patient declined, no reason specified.  Patient with c/o of stomach discomfort.  Agreed to have OT come back at 11:30 to trial OOB for lunch.  OT will check back.    Jakobie Henslee D Vaida Kerchner 11/12/2022, 10:16 AM 11/12/2022  RP, OTR/L  Acute Rehabilitation Services  Office:  [phone]

## 2022-11-12 NOTE — Inpatient Diabetes Management (Signed)
Inpatient Diabetes Program Recommendations  AACE/ADA: New Consensus Statement on Inpatient Glycemic Control (2015)  Target Ranges:  Prepandial:   less than 140 mg/dL      Peak postprandial:   less than 180 mg/dL (1-2 hours)      Critically ill patients:  140 - 180 mg/dL    Latest Reference Range & Units 11/11/22 06:28 11/11/22 11:10 11/11/22 16:32 11/11/22 21:21  Glucose-Capillary 70 - 99 mg/dL 161 (H) 096 (H)  2 units Novolog  155 (H)  1 unit Novolog  305 (H)  (H): Data is abnormally high  Latest Reference Range & Units 11/12/22 06:10  Glucose-Capillary 70 - 99 mg/dL 045 (H)  (H): Data is abnormally high      Home DM Meds: Novolog 0-20 units TID per SSI        Lantus 90 units AM/ 30 units PM  Current Orders: Novolog 0-6 units TID    MD- Note pt had 2 afternoon CBG elevations yesterday after consumption of meals  Please consider:  1. Start low dose Novolog Meal Coverage: Novolog 2 units TID with meals HOLD if pt NPO HOLD if pt eats <50% meals  2. Add Bedtime Correction scale to current SSI (see Glycemic control order set)   --Will follow patient during hospitalization--  Ambrose Finland RN, MSN, CDCES Diabetes Coordinator Inpatient Glycemic Control Team Team Pager: [phone] (8a-5p)

## 2022-11-12 NOTE — Progress Notes (Signed)
Contacted by CSW that pt will return home with husband at d/c. Pt and pt's husband prefer [facility] per CSW. Spoke to pt's husband via phone. Introduced self and explained role. Pt's husband prefers a MWF 2nd shift at [facility] if possible. Referral submitted to [facility] admissions this morning for review. Will assist as needed.   Olivia Canter Renal Navigator [phone]

## 2022-11-12 NOTE — Plan of Care (Signed)
  Problem: Education: Goal: Knowledge of General Education information will improve Description: Including pain rating scale, medication(s)/side effects and non-pharmacologic comfort measures Outcome: Progressing   Problem: Health Behavior/Discharge Planning: Goal: Ability to manage health-related needs will improve Outcome: Progressing   Problem: Clinical Measurements: Goal: Ability to maintain clinical measurements within normal limits will improve Outcome: Progressing Goal: Will remain free from infection Outcome: Progressing Goal: Diagnostic test results will improve Outcome: Progressing Goal: Respiratory complications will improve Outcome: Progressing Goal: Cardiovascular complication will be avoided Outcome: Progressing   Problem: Activity: Goal: Risk for activity intolerance will decrease Outcome: Progressing   Problem: Nutrition: Goal: Adequate nutrition will be maintained Outcome: Progressing   Problem: Coping: Goal: Level of anxiety will decrease Outcome: Progressing   Problem: Elimination: Goal: Will not experience complications related to bowel motility Outcome: Progressing Goal: Will not experience complications related to urinary retention Outcome: Progressing   Problem: Pain Managment: Goal: General experience of comfort will improve Outcome: Progressing   Problem: Safety: Goal: Ability to remain free from injury will improve Outcome: Progressing   Problem: Skin Integrity: Goal: Risk for impaired skin integrity will decrease Outcome: Progressing   Problem: Education: Goal: Ability to describe self-care measures that may prevent or decrease complications (Diabetes Survival Skills Education) will improve Outcome: Progressing Goal: Individualized Educational Video(s) Outcome: Progressing   Problem: Coping: Goal: Ability to adjust to condition or change in health will improve Outcome: Progressing   Problem: Fluid Volume: Goal: Ability to  maintain a balanced intake and output will improve Outcome: Progressing   Problem: Health Behavior/Discharge Planning: Goal: Ability to identify and utilize available resources and services will improve Outcome: Progressing Goal: Ability to manage health-related needs will improve Outcome: Progressing   

## 2022-11-12 NOTE — Progress Notes (Signed)
OT Cancellation Note  Patient Details Name: SHAWNEEN DEETZ MRN: [id number] DOB: June 28, 1940   Cancelled Treatment:    Reason Eval/Treat Not Completed: Patient declined, no reason specified.  Declined times two due to stomach issues.    Ambriella Kitt D Tyriana Helmkamp 11/12/2022, 5:19 PM 11/12/2022  RP, OTR/L  Acute Rehabilitation Services  Office:  [phone]

## 2022-11-12 NOTE — Plan of Care (Signed)

## 2022-11-13 DIAGNOSIS — J9602 Acute respiratory failure with hypercapnia: Secondary | ICD-10-CM | POA: Diagnosis not present

## 2022-11-13 DIAGNOSIS — J9601 Acute respiratory failure with hypoxia: Secondary | ICD-10-CM | POA: Diagnosis not present

## 2022-11-13 LAB — GLUCOSE, CAPILLARY
Glucose-Capillary: 120 mg/dL — ABNORMAL HIGH (ref 70–99)
Glucose-Capillary: 121 mg/dL — ABNORMAL HIGH (ref 70–99)
Glucose-Capillary: 175 mg/dL — ABNORMAL HIGH (ref 70–99)

## 2022-11-13 LAB — RENAL FUNCTION PANEL
Albumin: 2.3 g/dL — ABNORMAL LOW (ref 3.5–5.0)
Anion gap: 9 (ref 5–15)
BUN: 35 mg/dL — ABNORMAL HIGH (ref 8–23)
CO2: 28 mmol/L (ref 22–32)
Calcium: 8.4 mg/dL — ABNORMAL LOW (ref 8.9–10.3)
Chloride: 98 mmol/L (ref 98–111)
Creatinine, Ser: 3.25 mg/dL — ABNORMAL HIGH (ref 0.44–1.00)
GFR, Estimated: 14 mL/min — ABNORMAL LOW (ref >60)
Glucose, Bld: 154 mg/dL — ABNORMAL HIGH (ref 70–99)
Phosphorus: 4 mg/dL (ref 2.5–4.6)
Potassium: 4 mmol/L (ref 3.5–5.1)
Sodium: 135 mmol/L (ref 135–145)

## 2022-11-13 LAB — MAGNESIUM: Magnesium: 2 mg/dL (ref 1.7–2.4)

## 2022-11-13 MED ORDER — HEPARIN SODIUM (PORCINE) 1000 UNIT/ML IJ SOLN
INTRAMUSCULAR | Status: AC
  Administered 2022-11-13: 1000 [IU]
  Filled 2022-11-13: qty 4

## 2022-11-13 NOTE — Progress Notes (Signed)
   11/13/22 1348  Vitals  Temp 98.3 F (36.8 C)  Pulse Rate (!) 52  Resp 20  BP (!) 178/55  SpO2 97 %  O2 Device Nasal Cannula  Weight (S)  91.2 kg (Weight Bed)  Type of Weight Post-Dialysis  Oxygen Therapy  O2 Flow Rate (L/min) 3 L/min  Patient Activity (if Appropriate) In bed  Pulse Oximetry Type Continuous  Post Treatment  Dialyzer Clearance Clear  Duration of HD Treatment -hour(s) 3.5 hour(s)  Hemodialysis Intake (mL) 0 mL  Fluid Removed (mL) 2500 mL  Post-Hemodialysis Comments Tx. Completed without difficulties and UF goal achieved of 2.5 L. VSS and report call to [facility] bedside RN Shawnie Dapper   Received patient in bed to unit.  Alert and oriented.  Informed consent signed and in chart.   TX duration:3.5  Patient tolerated well.  Transported back to the room  Alert, without acute distress.  Hand-off given to patient's nurse.   Access used: Yes Access issues: No  Total UF removed: 2500 Medication(s) given: See MAR Post HD VS: See Above Grid Post HD weight: 91.2kg   [facility]

## 2022-11-13 NOTE — Progress Notes (Addendum)
Contacted [facility] admissions and local clinic to request an update on pt's referral. Will await response.   Olivia Canter Renal Navigator [phone]  Addendum at 2:26 pm: Pt has been accepted at [facility] on MWF 10:30 chair time. Pt can start on Monday and will need to arrive at 9:45 am to complete paperwork prior to treatment. Met with pt's husband in pt's room (pt currently off the floor) to discuss pt's out-pt arrangements. Husband agreeable to arrangements and schedule letter provided with details. Arrangements placed on pt's AVS as well. Contacted attending, nephrologist, CSW, and RN CM to provide update. Contacted renal NP regarding clinic's need for orders at d/c. Attending states pt may d/c this weekend if transportation can be arranged to/from HD on Monday. Will assist as needed.

## 2022-11-13 NOTE — Progress Notes (Signed)
Patient refused to have B/P checked and vitals signs stating she was going home tomorrow and she feels fine. WIll try to check vitals later this morning. Patient trying to sleep.

## 2022-11-13 NOTE — Progress Notes (Signed)
PROGRESS NOTE    Kayla [name]  UJW:[id number] DOB: [birth year] DOA: 10/25/2022 PCP: Ailene Ravel, MD    Brief Narrative:  82 year old Caucasian female, significantly obese, with recent history of MRSA bacteremia/infective endocarditis for which patient was on antibiotics for a long time.  Other medical history includes coronary artery disease, carotid artery disease, chronic kidney disease stage IV, COPD, diabetes mellitus type 2, hypertension, hyperlipidemia, hypothyroidism and documented congestive heart failure.  Recent echocardiograms revealed normal left ventricular ejection fraction, without mention of diastolic dysfunction.  Patient was admitted with pulmonary edema, acute kidney injury on chronic kidney disease stage IV with oliguria versus anuria, requiring CRRT for about 48 hours and now HD.  D/c held until outpatient HD center set up.   Assessment and Plan: Acute kidney injury on chronic kidney disease stage IV/pulmonary edema: -Nephrology has changed diuresis from lasix 80 mg bid to torsemide 40 mg daily, but creatinine continues to rise. -Patient was on CRRT for about 48 hours. She states that she would agree to HD.  -starting HD on 7/10:Given ongoing difficulty with volume management will do dialysis  and initiate process to pursue outpt AKI dialysis. Renal navigator has been alerted.    Acute on chronic hypoxic/hypercapnic respiratory failure: -Patient has been on oxygen for about 1 year (2 L/min via nasal cannula). -Etiology is likely multifactorial. -Pt complains of increasing work of breathing. Likely due to worsening renal statu -Discussed need to comply with CPAP management and, treatment of OSA/likely undiagnosed OHS.   COPD: -Seems compensated. -Continue current regimen.   Left lateral chest wall pain: -X-ray of the ribs revealed minimally displaced posterolateral left seventh rib fracture.  -Adequate pain control. -Lidocaine patch to left lateral rib cage  area. -Supportive management.   Paroxysmal atrial fibrillation: -Controlled heart rate. -Patient is on apixaban 2.5 Mg p.o. twice daily.   Anemia of chronic kidney disease: -Patient has received IV iron. -Patient is on Aranesp every 2 weeks.   Hypertension: -Continue to optimize.   Diabetes mellitus:  -SSI   Hyperlipidemia: -Continue atorvastatin.   Possible community acquired pneumonia: -Findings/events may be suggestive of pulmonary edema. -abx stopped     DVT prophylaxis: apixaban (ELIQUIS) tablet 2.5 mg Start: 10/29/22 1100 SCDs Start: 10/25/22 0958 apixaban (ELIQUIS) tablet 2.5 mg    Code Status: DNR   Disposition Plan:  Level of care: Progressive Status is: Inpatient Remains inpatient appropriate     Consultants:  PCCM renal   Subjective: Seen in HD  Objective: Vitals:   11/13/22 1105 11/13/22 1130 11/13/22 1208 11/13/22 1228  BP: (!) 146/47 (!) 140/57 (!) 120/57 (!) 129/57  Pulse: (!) 52 (!) 54 (!) 54 (!) 53  Resp:  16 16 18   Temp:      TempSrc:      SpO2: 98% 99% 99% 100%  Weight:      Height:        Intake/Output Summary (Last 24 hours) at 11/13/2022 1301 Last data filed at 11/13/2022 0700 Gross per 24 hour  Intake 240 ml  Output 300 ml  Net -60 ml   Filed Weights   11/12/22 0932 11/13/22 0500 11/13/22 0926  Weight: 94.4 kg 94 kg (S) 93.6 kg    Examination:   General: Appearance:    Obese female in no acute distress     Lungs:     respirations unlabored  Heart:    Bradycardic.       Neurologic:   Awake, alert  Data Reviewed: I have personally reviewed following labs and imaging studies  CBC: Recent Labs  Lab 11/07/22 0010 11/09/22 0018 11/11/22 0000 11/12/22 0033  WBC 11.5* 10.3 8.9 10.0  HGB 7.6* 8.5* 8.4* 8.5*  HCT 25.2* 28.4* 28.7* 29.0*  MCV 98.4 100.0 100.3* 97.3  PLT 387 400 402* 402*   Basic Metabolic Panel: Recent Labs  Lab 11/09/22 0018 11/10/22 0013 11/11/22 0000 11/12/22 0033  11/13/22 0023  NA 134* 136 136 134* 135  K 3.8 3.9 4.3 4.1 4.0  CL 96* 97* 98 96* 98  CO2 27 27 25 28 28   GLUCOSE 213* 159* 141* 202* 154*  BUN 57* 58* 56* 29* 35*  CREATININE 3.27* 3.22* 3.32* 2.52* 3.25*  CALCIUM 8.7* 8.8* 8.9 8.5* 8.4*  MG 1.9 1.8 1.9 1.6* 2.0  PHOS 4.7* 4.3 4.2 3.2 4.0   GFR: Estimated Creatinine Clearance: 15.3 mL/min (A) (by C-G formula based on SCr of 3.25 mg/dL (H)). Liver Function Tests: Recent Labs  Lab 11/09/22 0018 11/10/22 0013 11/11/22 0000 11/12/22 0033 11/13/22 0023  ALBUMIN 2.4* 2.4* 2.5* 2.5* 2.3*   No results for input(s): "LIPASE", "AMYLASE" in the last 168 hours. No results for input(s): "AMMONIA" in the last 168 hours. Coagulation Profile: No results for input(s): "INR", "PROTIME" in the last 168 hours. Cardiac Enzymes: No results for input(s): "CKTOTAL", "CKMB", "CKMBINDEX", "TROPONINI" in the last 168 hours. BNP (last 3 results) Recent Labs    02/16/22 1008  PROBNP 699   HbA1C: No results for input(s): "HGBA1C" in the last 72 hours. CBG: Recent Labs  Lab 11/12/22 0610 11/12/22 1114 11/12/22 1616 11/12/22 2105 11/13/22 0617  GLUCAP 149* 214* 118* 149* 120*   Lipid Profile: No results for input(s): "CHOL", "HDL", "LDLCALC", "TRIG", "CHOLHDL", "LDLDIRECT" in the last 72 hours. Thyroid Function Tests: No results for input(s): "TSH", "T4TOTAL", "FREET4", "T3FREE", "THYROIDAB" in the last 72 hours. Anemia Panel: No results for input(s): "VITAMINB12", "FOLATE", "FERRITIN", "TIBC", "IRON", "RETICCTPCT" in the last 72 hours. Sepsis Labs: No results for input(s): "PROCALCITON", "LATICACIDVEN" in the last 168 hours.  No results found for this or any previous visit (from the past 240 hour(s)).       Radiology Studies: No results found.      Scheduled Meds:  heparin sodium (porcine)       amLODipine  10 mg Oral Daily   apixaban  2.5 mg Oral BID   atorvastatin  40 mg Oral Daily   bisacodyl  10 mg Rectal Daily   Or    bisacodyl  10 mg Oral Daily   Chlorhexidine Gluconate Cloth  6 each Topical Daily   darbepoetin (ARANESP) injection - NON-DIALYSIS  100 mcg Subcutaneous Q Wed-1800   escitalopram  5 mg Oral Daily   guaiFENesin  600 mg Oral BID   insulin aspart  0-6 Units Subcutaneous TID WC   insulin aspart  2 Units Subcutaneous TID WC   isosorbide mononitrate  60 mg Oral Daily   levothyroxine  200 mcg Oral Q0600   lidocaine  1 patch Transdermal Q24H   metolazone  2.5 mg Oral Daily   multivitamin  1 tablet Oral QHS   mouth rinse  15 mL Mouth Rinse 4 times per day   sevelamer carbonate  800 mg Oral TID WC   sodium chloride flush  3 mL Intravenous Q12H   torsemide  100 mg Oral BID   Continuous Infusions:  sodium chloride Stopped (10/26/22 1934)     LOS: 19 days  Time spent: 45 minutes spent on chart review, discussion with nursing staff, consultants, updating family and interview/physical exam; more than 50% of that time was spent in counseling and/or coordination of care.    Joseph Art, DO [facility] Available via Epic secure chat 7am-7pm After these hours, please refer to coverage provider listed on [email or web address] 11/13/2022, 1:01 PM

## 2022-11-13 NOTE — Progress Notes (Signed)
New Dialysis Start    Patient identified as new dialysis start. Kidney Education packet assembled and given. Discussed the following items with patient:     Current medications and possible changes once started:  Discussed that patient's medications may change over time.  Ex; hypertension medications and diabetes medication.  Nephrologists will adjust as needed.   Fluid restrictions reviewed:  32 oz daily goal:  All liquids count; soups, ice, jello, fruits. Will also refer dietitian.   Phosphorus and potassium: Handout given showing high potassium and phosphorus foods.  Alternative food and drink options given. Will also refer dietitian.   Family support:  Husband at bedside.   Outpatient Clinic Resources:  Discussed roles of Outpatient clinic staff and advised to make a list of needs, if any, to talk with outpatient staff if needed.   Care plan schedule: Informed patient of Care Plans in outpatient setting and to participate in the care plan.  An invitation would be given from outpatient clinic.    Dialysis Access Options:  Reviewed access options with patients. Discussed in detail about care at home with new AVG & AVF. Reviewed checking bruit and thrill. If dialysis catheter present, educated that patient could not take showers.  Catheter dressing changes were to be done by outpatient clinic staff only   Home therapy options:  Educated patient about home therapy options:  PD vs home hemo.     Patient verbalized understanding. Will continue to round on patient during admission.  Patient and husband currently waiting on transportation to be set up.   Jean Rosenthal Dialysis Nurse Coordinator [phone]

## 2022-11-13 NOTE — TOC Progression Note (Addendum)
Transition of Care [facility]) - Progression Note    Patient Details  Name: Kayla [name] MRN: [id number] Date of Birth: July 10, 1940  Transition of Care [facility]) CM/SW Contact  Leone Haven, RN Phone Number: 11/13/2022, 2:28 PM  Clinical Narrative:    NCM spoke with patient's spouse, he is calling the insurance company to see if he can get the transportation set up for Monday and he will call this NCM back , she will have HD on MWF.  She is [facility] up with [facility] for HHRN, HHPT, HHOT.  Patient has transport set up for HD on MWF, thru [employer] at 9:45 , the reservation number for Monday is 10570, wed is [id number], Friday  [address]92  phone number to call is [phone] for pick up.  NCM spoke to rep at [employer]  to switch out the w/chair they have, [employer] rep will call logistics to switch out and get a eta and then will call patient's spouse with the eta for them to bring to the home today to switch.    Expected Discharge Plan: Skilled Nursing Facility Barriers to Discharge: Continued Medical Work up  Expected Discharge Plan and Services   Discharge Planning Services: CM Consult Post Acute Care Choice: Home Health Living arrangements for the past 2 months: Single Family Home                 DME Arranged: Wheelchair manual DME Agency: [employer] Date DME Agency Contacted: 11/02/22 Time DME Agency Contacted: [phone] Representative spoke with at DME Agency: Zack HH Arranged: RN, PT HH Agency: [facility] Date [facility] Agency Contacted: 10/29/22 Time HH Agency Contacted: [phone] Representative spoke with at [facility] Agency: Haywood Lasso   Social Determinants of Health (SDOH) Interventions SDOH Screenings   Food Insecurity: No Food Insecurity (11/04/2022)  Housing: Patient Declined (11/04/2022)  Transportation Needs: No Transportation Needs (11/04/2022)  Utilities: Not At Risk (11/04/2022)  Alcohol Screen: Low Risk  (01/31/2021)  Depression (PHQ2-9): Low Risk  (10/15/2022)  Financial Resource Strain: Low Risk   (01/31/2021)  Physical Activity: Insufficiently Active (09/30/2021)  Social Connections: Socially Integrated (09/30/2021)  Stress: No Stress Concern Present (09/30/2021)  Tobacco Use: Medium Risk (11/10/2022)    Readmission Risk Interventions    10/01/2022    3:52 PM 01/22/2022    2:43 PM 07/23/2021   12:10 PM  Readmission Risk Prevention Plan  Transportation Screening Complete Complete Complete  PCP or Specialist Appt within 3-5 Days Complete Complete   HRI or Home Care Consult  Complete   Social Work Consult for Recovery Care Planning/Counseling Complete Complete   Palliative Care Screening Not Applicable Not Applicable   Medication Review Oceanographer) Referral to Pharmacy Complete Complete  HRI or Home Care Consult   Complete  SW Recovery Care/Counseling Consult   Complete  Palliative Care Screening   Not Applicable  Skilled Nursing Facility   Complete

## 2022-11-13 NOTE — Progress Notes (Signed)
Brief Nutrition Follow-Up Note  RD consulted for renal diet education.   Pt transitioned to HD on 11/11/22 and will require outpatient HD. Per discussion with RD who assessed pt on 11/10/22, pt with poor oral intake and limited acceptance of supplements. Diet has been liberalized to regular with 1.2 L fluid restriction in light of this.   Pt unavailable at time of visit. Attempted to speak with pt via call to hospital room phone, however, unable to reach.   RD provided "Low Sodium Nutrition Therapy" handout from [facility] Nutrition Care Manual; attached to AVS/ discharge summary. Pt will also have access to RD at HD center for further support, education, and reinforcement.   Per renal navigator notes, pt has outpatient HD bed starting on Monday, 11/16/22. Plan to d/c home with husband once medically stable.   RD will continue to follow for acute nutrition needs and adjust care plan as appropriate.   Levada Schilling, RD, LDN, CDCES Registered Dietitian II Certified Diabetes Care and Education Specialist Please refer to [facility] for RD and/or RD on-call/weekend/after hours pager

## 2022-11-13 NOTE — Discharge Instructions (Addendum)
Information on my medicine - ELIQUIS (apixaban)  This medication education was reviewed with me or my healthcare representative as part of my discharge preparation.  The pharmacist that spoke with me during my hospital stay was:    Why was Eliquis prescribed for you? Eliquis was prescribed for you to reduce the risk of a blood clot forming that can cause a stroke if you have a medical condition called atrial fibrillation (a type of irregular heartbeat).  What do You need to know about Eliquis ? Take your Eliquis TWICE DAILY - one tablet in the morning and one tablet in the evening with or without food. If you have difficulty swallowing the tablet whole please discuss with your pharmacist how to take the medication safely.  Take Eliquis exactly as prescribed by your doctor and DO NOT stop taking Eliquis without talking to the doctor who prescribed the medication.  Stopping may increase your risk of developing a stroke.  Refill your prescription before you run out.  After discharge, you should have regular check-up appointments with your healthcare provider that is prescribing your Eliquis.  In the future your dose may need to be changed if your kidney function or weight changes by a significant amount or as you get older.  What do you do if you miss a dose? If you miss a dose, take it as soon as you remember on the same day and resume taking twice daily.  Do not take more than one dose of ELIQUIS at the same time to make up a missed dose.  Important Safety Information A possible side effect of Eliquis is bleeding. You should call your healthcare provider right away if you experience any of the following: Bleeding from an injury or your nose that does not stop. Unusual colored urine (red or dark brown) or unusual colored stools (red or black). Unusual bruising for unknown reasons. A serious fall or if you hit your head (even if there is no bleeding).  Some medicines may interact with  Eliquis and might increase your risk of bleeding or clotting while on Eliquis. To help avoid this, consult your healthcare provider or pharmacist prior to using any new prescription or non-prescription medications, including herbals, vitamins, non-steroidal anti-inflammatory drugs (NSAIDs) and supplements.  This website has more information on Eliquis (apixaban): http://[email or web address].com/eliquis/home  Low Sodium Nutrition Therapy  Eating less sodium can help you if you have high blood pressure, heart failure, or kidney or liver disease.   Your body needs a little sodium, but too much sodium can cause your body to hold onto extra water. This extra water will raise your blood pressure and can cause damage to your heart, kidneys, or liver as they are forced to work harder.   Sometimes you can see how the extra fluid affects you because your hands, legs, or belly swell. You may also hold water around your heart and lungs, which makes it hard to breathe.   Even if you take medication for blood pressure or a water pill (diuretic) to remove fluid, it is still important to have less salt in your diet.   Check with your primary care provider before drinking alcohol since it may affect the amount of fluid in your body and how your heart, kidneys, or liver work. Sodium in Food A low-sodium meal plan limits the sodium that you get from food and beverages to 1,500-2,000 milligrams (mg) per day. Salt is the main source of sodium. Read the nutrition label on the package to  find out how much sodium is in one serving of a food.  Select foods with 140 milligrams (mg) of sodium or less per serving.  You may be able to eat one or two servings of foods with a little more than 140 milligrams (mg) of sodium if you are closely watching how much sodium you eat in a day.  Check the serving size on the label. The amount of sodium listed on the label shows the amount in one serving of the food. So, if you eat more than one  serving, you will get more sodium than the amount listed.  Tips Cutting Back on Sodium Eat more fresh foods.  Fresh fruits and vegetables are low in sodium, as well as frozen vegetables and fruits that have no added juices or sauces.  Fresh meats are lower in sodium than processed meats, such as bacon, sausage, and hotdogs.  Not all processed foods are unhealthy, but some processed foods may have too much sodium.  Eat less salt at the table and when cooking. One of the ingredients in salt is sodium.  One teaspoon of table salt has 2,300 milligrams of sodium.  Leave the salt out of recipes for pasta, casseroles, and soups. Be a Engineer, building services.  Food packages that say "Salt-free", sodium-free", "very low sodium," and "low sodium" have less than 140 milligrams of sodium per serving.  Beware of products identified as "Unsalted," "No Salt Added," "Reduced Sodium," or "Lower Sodium." These items may still be high in sodium. You should always check the nutrition label. Add flavors to your food without adding sodium.  Try lemon juice, lime juice, or vinegar.  Dry or fresh herbs add flavor.  Buy a sodium-free seasoning blend or make your own at home. You can purchase salt-free or sodium-free condiments like barbeque sauce in stores and online. Ask your registered dietitian nutritionist for recommendations and where to find them.   Eating in Restaurants Choose foods carefully when you eat outside your home. Restaurant foods can be very high in sodium. Many restaurants provide nutrition facts on their menus or their websites. If you cannot find that information, ask your server. Let your server know that you want your food to be cooked without salt and that you would like your salad dressing and sauces to be served on the side.    Foods Recommended Food Group Foods Recommended  Grains Bread, bagels, rolls without salted tops Homemade bread made with reduced-sodium baking powder Cold cereals,  especially shredded wheat and puffed rice Oats, grits, or cream of wheat Pastas, quinoa, and rice Popcorn, pretzels or crackers without salt Corn tortillas  Protein Foods Fresh meats and fish; [place] bacon (check the nutrition labels - make sure they are not packaged in a sodium solution) Canned or packed tuna (no more than 4 ounces at 1 serving) Beans and peas Soybeans) and tofu Eggs Nuts or nut butters without salt  Dairy Milk or milk powder Plant milks, such as rice and soy Yogurt, including Greek yogurt Small amounts of natural cheese (blocks of cheese) or reduced-sodium cheese can be used in moderation. (Swiss, ricotta, and fresh mozzarella cheese are lower in sodium than the others) Cream Cheese Low sodium cottage cheese  Vegetables Fresh and frozen vegetables without added sauces or salt Homemade soups (without salt) Low-sodium, salt-free or sodium-free canned vegetables and soups  Fruit Fresh and canned fruits Dried fruits, such as raisins, cranberries, and prunes  Oils Tub or liquid margarine, regular or without salt Canola, corn,  peanut, olive, safflower, or sunflower oils  Condiments Fresh or dried herbs such as basil, bay leaf, dill, mustard (dry), nutmeg, paprika, parsley, rosemary, sage, or thyme.  Low sodium ketchup Vinegar  Lemon or lime juice Pepper, red pepper flakes, and cayenne. Hot sauce contains sodium, but if you use just a drop or two, it will not add up to much.  Salt-free or sodium-free seasoning mixes and marinades Simple salad dressings: vinegar and oil   Foods Not Recommended Food Group Foods Not Recommended  Grains Breads or crackers topped with salt Cereals (hot/cold) with more than 300 mg sodium per serving Biscuits, cornbread, and other "quick" breads prepared with baking soda Pre-packaged bread crumbs Seasoned and packaged rice and pasta mixes Self-rising flours  Protein Foods Cured meats: Bacon, ham, sausage, pepperoni and hot dogs Canned  meats (chili, vienna sausage, or sardines) Smoked fish and meats Frozen meals that have more than 600 mg of sodium per serving Egg substitute (with added sodium)  Dairy Buttermilk Processed cheese spreads Cottage cheese (1 cup may have over 500 mg of sodium; look for low-sodium.) American or feta cheese Shredded Cheese has more sodium than blocks of cheese String cheese  Vegetables Canned vegetables (unless they are salt-free, sodium-free or low sodium) Frozen vegetables with seasoning and sauces Sauerkraut and pickled vegetables Canned or dried soups (unless they are salt-free, sodium-free, or low sodium) [place] fries and onion rings  Fruit Dried fruits preserved with additives that have sodium  Oils Salted butter or margarine, all types of olives  Condiments Salt, sea salt, kosher salt, onion salt, and garlic salt Seasoning mixes with salt Bouillon cubes Ketchup Barbeque sauce and Worcestershire sauce unless low sodium Soy sauce Salsa, pickles, olives, relish Salad dressings: ranch, blue cheese, [place], and [place].   Low Sodium Sample 1-Day Menu  Breakfast 1 cup cooked oatmeal  1 slice whole wheat bread toast  1 tablespoon peanut butter without salt  1 banana  1 cup 1% milk  Lunch Tacos made with: 2 corn tortillas   cup black beans, low sodium   cup roasted or grilled chicken (without skin)   avocado  Squeeze of lime juice  1 cup salad greens  1 tablespoon low-sodium salad dressing   cup strawberries  1 orange  Afternoon Snack 1/3 cup grapes  6 ounces yogurt  Evening Meal 3 ounces herb-baked fish  1 baked potato  2 teaspoons olive oil   cup cooked carrots  2 thick slices tomatoes on:  2 lettuce leaves  1 teaspoon olive oil  1 teaspoon balsamic vinegar  1 cup 1% milk  Evening Snack 1 apple   cup almonds without salt   Low-Sodium Vegetarian (Lacto-Ovo) Sample 1-Day Menu  Breakfast 1 cup cooked oatmeal  1 slice whole wheat toast  1 tablespoon peanut  butter without salt  1 banana  1 cup 1% milk  Lunch Tacos made with: 2 corn tortillas   cup black beans, low sodium   cup roasted or grilled chicken (without skin)   avocado  Squeeze of lime juice  1 cup salad greens  1 tablespoon low-sodium salad dressing   cup strawberries  1 orange  Evening Meal Stir fry made with:  cup tofu  1 cup brown rice   cup broccoli   cup green beans   cup peppers   tablespoon peanut oil  1 orange  1 cup 1% milk  Evening Snack 4 strips celery  2 tablespoons hummus  1 hard-boiled egg   Low-Sodium Vegan  Sample 1-Day Menu  Breakfast 1 cup cooked oatmeal  1 tablespoon peanut butter without salt  1 cup blueberries  1 cup soymilk fortified with calcium, vitamin B12, and vitamin D  Lunch 1 small whole wheat pita   cup cooked lentils  2 tablespoons hummus  4 carrot sticks  1 medium apple  1 cup soymilk fortified with calcium, vitamin B12, and vitamin D  Evening Meal Stir fry made with:  cup tofu  1 cup brown rice   cup broccoli   cup green beans   cup peppers   tablespoon peanut oil  1 cup cantaloupe  Evening Snack 1 cup soy yogurt   cup mixed nuts  Copyright 2020  Academy of Nutrition and Dietetics. All rights reserved  Sodium Free Flavoring Tips  When cooking, the following items may be used for flavoring instead of salt or seasonings that contain sodium. Remember: A little bit of spice goes a long way! Be careful not to overseason. Spice Blend Recipe (makes about ? cup) 5 teaspoons onion powder  2 teaspoons garlic powder  2 teaspoons paprika  2 teaspoon dry mustard  1 teaspoon crushed thyme leaves   teaspoon white pepper   teaspoon celery seed Food Item Flavorings  Beef Basil, bay leaf, caraway, curry, dill, dry mustard, garlic, grape jelly, green pepper, mace, marjoram, mushrooms (fresh), nutmeg, onion or onion powder, parsley, pepper, rosemary, sage  Chicken Basil, cloves, cranberries, mace, mushrooms (fresh),  nutmeg, oregano, paprika, parsley, pineapple, saffron, sage, savory, tarragon, thyme, tomato, turmeric  Egg Chervil, curry, dill, dry mustard, garlic or garlic powder, green pepper, jelly, mushrooms (fresh), nutmeg, onion powder, paprika, parsley, rosemary, tarragon, tomato  Fish Basil, bay leaf, chervil, curry, dill, dry mustard, green pepper, lemon juice, marjoram, mushrooms (fresh), paprika, pepper, tarragon, tomato, turmeric  Lamb Cloves, curry, dill, garlic or garlic powder, mace, mint, mint jelly, onion, oregano, parsley, pineapple, rosemary, tarragon, thyme  Pork Applesauce, basil, caraway, chives, cloves, garlic or garlic powder, onion or onion powder, rosemary, thyme  Veal Apricots, basil, bay leaf, currant jelly, curry, ginger, marjoram, mushrooms (fresh), oregano, paprika  Vegetables Basil, dill, garlic or garlic powder, ginger, lemon juice, mace, marjoram, nutmeg, onion or onion powder, tarragon, tomato, sugar or sugar substitute, salt-free salad dressing, vinegar  Desserts Allspice, anise, cinnamon, cloves, ginger, mace, nutmeg, vanilla extract, other extracts   Copyright 2020  Academy of Nutrition and Dietetics. All rights reserved  Fluid Restricted Nutrition Therapy  You have been prescribed this diet because your condition affects how much fluid you can eat or drink. If your heart, liver, or kidneys aren't working properly, you may not be able to effectively eliminate fluids from the body and this may cause swelling (edema) in the legs, arms, and/or stomach. Drink no more than _________ liters or ________ ounces or ________cups of fluid per day.  You don't need to stop eating or drinking the same fluids you normally would, but you may need to eat or drink less than usual.  Your registered dietitian nutritionist will help you determine the correct amount of fluid to consume during the day Breakfast Include fluids taken with medications  Lunch Include fluids taken with medications   Dinner Include fluids taken with medications  Bedtime Snack Include fluids taken with medications     Tips What Are Fluids?  A fluid is anything that is liquid or anything that would melt if left at room temperature. You will need to count these foods and liquids--including any liquid used to take medication--as  part of your daily fluid intake. Some examples are: Alcohol (drink only with your doctor's permission)  Coffee, tea, and other hot beverages  Gelatin (Jell-O)  Gravy  Ice cream, sherbet, sorbet  Ice cubes, ice chips  Milk, liquid creamer  Nutritional supplements  Popsicles  Vegetable and fruit juices; fluid in canned fruit  Watermelon  Yogurt  Soft drinks, lemonade, limeade  Soups  Syrup How Do I Measure My Fluid Intake? Record your fluid intake daily.  Tip: Every day, each time you eat or drink fluids, pour water in the same amount into an empty container that can hold the same amount of fluids you are allowed daily. This may help you keep track of how much fluid you are taking in throughout the day.  To accurately keep track of how much liquid you take in, measure the size of the cups, glasses, and bowls you use. If you eat soup, measure how much of it is liquid and how much is solid (such as noodles, vegetables, meat). Conversions for Measuring Fluid Intake  Milliliters (mL) Liters (L) Ounces (oz) Cups (c)  1000 1 32 4  1200 1.2 40 5  1500 1.5 50 6 1/4  1800 1.8 60 7 1/2  2000 2 67 8 1/3  Tips to Reduce Your Thirst Chew gum or suck on hard candy.  Rinse or gargle with mouthwash. Do not swallow.  Ice chips or popsicles my help quench thirst, but this too needs to be calculated into the total restriction. Melt ice chips or cubes first to figure out how much fluid they produce (for example, experiment with melting  cup ice chips or 2 ice cubes).  Add a lemon wedge to your water.  Limit how much salt you take in. A high salt intake might make you thirstier.  Don't  eat or drink all your allowed liquids at once. Space your liquids out through the day.  Use small glasses and cups and sip slowly. If allowed, take your medications with fluids you eat or drink during a meal.   Fluid-Restricted Nutrition Therapy Sample 1-Day Menu  Breakfast 1 slice wheat toast  1 tablespoon peanut butter  1/2 cup yogurt (120 milliliters)  1/2 cup blueberries  1 cup milk (240 milliliters)   Lunch 3 ounces sliced [place]  2 slices whole wheat bread  1/2 cup lettuce for sandwich  2 slices tomato for sandwich  1 ounce reduced-fat, reduced-sodium cheese  1/2 cup fresh carrot sticks  1 banana  1 cup unsweetened tea (240 milliliters)   Evening Meal 8 ounces soup (240 milliliters)  3 ounces salmon  1/2 cup quinoa  1 cup green beans  1 cup mixed greens salad  1 tablespoon olive oil  1 cup coffee (240 milliliters)  Evening Snack 1/2 cup sliced peaches  1/2 cup frozen yogurt (120 milliliters)  1 cup water (240 milliliters)  Copyright 2020  Academy of Nutrition and Dietetics. All rights reserved

## 2022-11-13 NOTE — Progress Notes (Signed)
PT Cancellation Note  Patient Details Name: Kayla [name] MRN: [id number] DOB: 11/05/1940   Cancelled Treatment:    Reason Eval/Treat Not Completed: (P) Patient at procedure or test/unavailable (Pt at HD. Will check back as schedule allows.)   Johny Shock 11/13/2022, 1:59 PM

## 2022-11-13 NOTE — Progress Notes (Signed)
Patient ID: Kayla [name], female   DOB: 08-04-1940, 82 y.o.   MRN: [id number] S: No new complaints and says feeling improved.  Seen at HD. Husband spoken to via phone this AM - no new concerns.   UOP yesterday  O:BP 120/74 (BP Location: Left Arm)   Pulse (!) 59   Temp 98.6 F (37 C) (Oral)   Resp 11   Ht 5\' 5"  (1.651 m)   Wt 94 kg   SpO2 94%   BMI 34.49 kg/m   Intake/Output Summary (Last 24 hours) at 11/13/2022 0912 Last data filed at 11/13/2022 0700 Gross per 24 hour  Intake 266.01 ml  Output 300 ml  Net -33.99 ml   Intake/Output: Gen: NAD - looks less fatigued and more comfortable than days prior CV:  no rub Resp: on O2, a few wheezes, no rales; normal WOB at rest Abd: +BS, soft, NT/ND Ext: 1+ improving tibial edema  Recent Labs  Lab 11/07/22 0010 11/08/22 0007 11/09/22 0018 11/10/22 0013 11/11/22 0000 11/12/22 0033 11/13/22 0023  NA 136 136 134* 136 136 134* 135  K 4.2 4.1 3.8 3.9 4.3 4.1 4.0  CL 97* 100 96* 97* 98 96* 98  CO2 27 25 27 27 25 28 28   GLUCOSE 208* 139* 213* 159* 141* 202* 154*  BUN 56* 56* 57* 58* 56* 29* 35*  CREATININE 3.16* 3.27* 3.27* 3.22* 3.32* 2.52* 3.25*  ALBUMIN 2.3* 2.4* 2.4* 2.4* 2.5* 2.5* 2.3*  CALCIUM 8.5* 8.6* 8.7* 8.8* 8.9 8.5* 8.4*  PHOS 4.6 4.8* 4.7* 4.3 4.2 3.2 4.0   Liver Function Tests: Recent Labs  Lab 11/11/22 0000 11/12/22 0033 11/13/22 0023  ALBUMIN 2.5* 2.5* 2.3*   No results for input(s): "LIPASE", "AMYLASE" in the last 168 hours. No results for input(s): "AMMONIA" in the last 168 hours. CBC: Recent Labs  Lab 11/07/22 0010 11/09/22 0018 11/11/22 0000 11/12/22 0033  WBC 11.5* 10.3 8.9 10.0  HGB 7.6* 8.5* 8.4* 8.5*  HCT 25.2* 28.4* 28.7* 29.0*  MCV 98.4 100.0 100.3* 97.3  PLT 387 400 402* 402*   Cardiac Enzymes: No results for input(s): "CKTOTAL", "CKMB", "CKMBINDEX", "TROPONINI" in the last 168 hours. CBG: Recent Labs  Lab 11/12/22 0610 11/12/22 1114 11/12/22 1616 11/12/22 2105  11/13/22 0617  GLUCAP 149* 214* 118* 149* 120*    Iron Studies: No results for input(s): "IRON", "TIBC", "TRANSFERRIN", "FERRITIN" in the last 72 hours. Studies/Results: No results found.  amLODipine  10 mg Oral Daily   apixaban  2.5 mg Oral BID   atorvastatin  40 mg Oral Daily   bisacodyl  10 mg Rectal Daily   Or   bisacodyl  10 mg Oral Daily   Chlorhexidine Gluconate Cloth  6 each Topical Daily   darbepoetin (ARANESP) injection - NON-DIALYSIS  100 mcg Subcutaneous Q Wed-1800   escitalopram  5 mg Oral Daily   guaiFENesin  600 mg Oral BID   insulin aspart  0-6 Units Subcutaneous TID WC   insulin aspart  2 Units Subcutaneous TID WC   isosorbide mononitrate  60 mg Oral Daily   levothyroxine  200 mcg Oral Q0600   lidocaine  1 patch Transdermal Q24H   metolazone  2.5 mg Oral Daily   multivitamin  1 tablet Oral QHS   mouth rinse  15 mL Mouth Rinse 4 times per day   sevelamer carbonate  800 mg Oral TID WC   sodium chloride flush  3 mL Intravenous Q12H   torsemide  100 mg Oral  BID    BMET    Component Value Date/Time   NA 135 11/13/2022 0023   NA 140 02/16/2022 1008   K 4.0 11/13/2022 0023   CL 98 11/13/2022 0023   CO2 28 11/13/2022 0023   GLUCOSE 154 (H) 11/13/2022 0023   BUN 35 (H) 11/13/2022 0023   BUN 47 (H) 02/16/2022 1008   CREATININE 3.25 (H) 11/13/2022 0023   CREATININE 2.10 (H) 10/07/2022 0939   CALCIUM 8.4 (L) 11/13/2022 0023   CALCIUM 9.9 06/04/2008 2240   GFRNONAA 14 (L) 11/13/2022 0023   GFRAA 45 (L) 02/16/2020 1124   CBC    Component Value Date/Time   WBC 10.0 11/12/2022 0033   RBC 2.98 (L) 11/12/2022 0033   HGB 8.5 (L) 11/12/2022 0033   HGB 11.3 01/30/2021 1154   HCT 29.0 (L) 11/12/2022 0033   HCT 35.9 01/30/2021 1154   PLT 402 (H) 11/12/2022 0033   PLT 389 01/30/2021 1154   MCV 97.3 11/12/2022 0033   MCV 91 01/30/2021 1154   MCH 28.5 11/12/2022 0033   MCHC 29.3 (L) 11/12/2022 0033   RDW 19.8 (H) 11/12/2022 0033   RDW 13.4 01/30/2021 1154    LYMPHSABS 1.4 10/25/2022 0730   MONOABS 1.3 (H) 10/25/2022 0730   EOSABS 0.1 10/25/2022 0730   BASOSABS 0.0 10/25/2022 0730     Assessment/Plan:   Acute kidney injury on CKD stage IV likely due to CHF exacerbation/fluid overload/bradycardia leading to reduced renal perfusion.  B/L Cr around 2, followed by Dr. Noah Charon as an outpatient. She had hyperkalemia, anasarca and did not respond with IV diuretics.  Also had features of uremic encephalopathy including tremor, AMS. -temp HD catheter placed on 6/23, started CRRT 6/23. CRRT off on 6/24 given access issues/alarms. RIJ temp line removed 6/26. Uremic symptoms improved at that time   -Cr down to 2.95 but rose to 3.83 and up to 4.36.  Resumed her outpatient torsemide dose of 40 mg daily but worsening UOP and BUN/Cr.    - s/p TDC placement by IR 11/04/22 and had 1 session of IHD 11/05/22 and tolerated it well with UF of 1 liter.  Cr improved from 4.36 to 3.07 but had slowly been climbing but is now stable.  However, largest concern is fairly low UOP with high dose diuretic. Given ongoing difficulty with volume management resumed HD 7/10 and initiated process to pursue outpt AKI dialysis.  Renal navigator has been alerted.  Plan next HD today.  Cont to monitor for signs of renal recovery but given protracted course may not end up recovering; considering AKI for now.   -She is interested in home therapies down the road if/when indicated.  She is a poor longterm HD candidate given her advanced age, multiple co-morbidities, and poor functional and nutritional status but she wishes to pursue   -normal renal artery duplex; no evidence of RAS -daily labs, strict I/O -cont to hold off on discharge for now pending arrangement of outpatient HD    Bradycardia: Seen by cardiology during admit. H/o PPM extraction secondary to bacteremia (Sep 07, 2022).  Off dopamine gtt, HR stable. Avoid beta blockers   Acute on chronic respiratory failure/respiratory distress  with hypoxia: lasix as above. CXR 6/25 revealed improvement in opacities. On 3L [place] at baseline @ home -improving, cont diuretics (if UOP remains low in coming days will d/c) and HD UF -TED hose recommended  -low sodium, fluid restricted diet   Sepsis due to possible pneumonia: s/p  antibiotics  per primary team.   Anemia: transfuse for Hgb <7. ESA started 6/26, increased dose of aranesp to 100 mcg 7/3. Feraheme x 1 dose 6/28.   CKD MBD: PO4 high  and now normalized on renvela, c/w renal diet  HTN: well controlled now.     Estill Bakes MD [facility] Pager [phone]

## 2022-11-13 NOTE — Progress Notes (Signed)
Tele dc'd per Dr Benjamine Mola

## 2022-11-14 DIAGNOSIS — J9601 Acute respiratory failure with hypoxia: Secondary | ICD-10-CM | POA: Diagnosis not present

## 2022-11-14 DIAGNOSIS — J9602 Acute respiratory failure with hypercapnia: Secondary | ICD-10-CM | POA: Diagnosis not present

## 2022-11-14 LAB — CBC
HCT: 36.9 % (ref 36.0–46.0)
Hemoglobin: 10.7 g/dL — ABNORMAL LOW (ref 12.0–15.0)
MCH: 28.4 pg (ref 26.0–34.0)
MCHC: 29 g/dL — ABNORMAL LOW (ref 30.0–36.0)
MCV: 97.9 fL (ref 80.0–100.0)
Platelets: 394 10*3/uL (ref 150–400)
RBC: 3.77 MIL/uL — ABNORMAL LOW (ref 3.87–5.11)
RDW: 19.5 % — ABNORMAL HIGH (ref 11.5–15.5)
WBC: 9.5 10*3/uL (ref 4.0–10.5)
nRBC: 0 % (ref 0.0–0.2)

## 2022-11-14 LAB — GLUCOSE, CAPILLARY: Glucose-Capillary: 111 mg/dL — ABNORMAL HIGH (ref 70–99)

## 2022-11-14 LAB — RENAL FUNCTION PANEL
Albumin: 2.6 g/dL — ABNORMAL LOW (ref 3.5–5.0)
Anion gap: 10 (ref 5–15)
BUN: 16 mg/dL (ref 8–23)
CO2: 27 mmol/L (ref 22–32)
Calcium: 9.1 mg/dL (ref 8.9–10.3)
Chloride: 98 mmol/L (ref 98–111)
Creatinine, Ser: 3.11 mg/dL — ABNORMAL HIGH (ref 0.44–1.00)
GFR, Estimated: 15 mL/min — ABNORMAL LOW (ref >60)
Glucose, Bld: 204 mg/dL — ABNORMAL HIGH (ref 70–99)
Phosphorus: 3.3 mg/dL (ref 2.5–4.6)
Potassium: 3.8 mmol/L (ref 3.5–5.1)
Sodium: 135 mmol/L (ref 135–145)

## 2022-11-14 LAB — MAGNESIUM: Magnesium: 1.8 mg/dL (ref 1.7–2.4)

## 2022-11-14 MED ORDER — SEVELAMER CARBONATE 800 MG PO TABS: 800.0000 mg | ORAL_TABLET | Freq: Three times a day (TID) | ORAL | 0 refills | Status: DC

## 2022-11-14 MED ORDER — RENA-VITE PO TABS: 1.0000 | ORAL_TABLET | Freq: Every day | ORAL | 0 refills | Status: DC

## 2022-11-14 MED ORDER — ISOSORBIDE MONONITRATE ER 60 MG PO TB24: 60.0000 mg | ORAL_TABLET | Freq: Every day | ORAL | 0 refills | Status: DC

## 2022-11-14 MED ORDER — METOLAZONE 2.5 MG PO TABS: 2.5000 mg | ORAL_TABLET | Freq: Every day | ORAL | 0 refills | Status: DC

## 2022-11-14 MED ORDER — ESCITALOPRAM OXALATE 5 MG PO TABS: 5.0000 mg | ORAL_TABLET | Freq: Every day | ORAL | 0 refills | Status: DC

## 2022-11-14 MED ORDER — TORSEMIDE 100 MG PO TABS: 100.0000 mg | ORAL_TABLET | Freq: Two times a day (BID) | ORAL | 0 refills | Status: AC

## 2022-11-14 NOTE — Progress Notes (Signed)
Patient ID: Kayla [name], female   DOB: [birth year], 82 y.o.   MRN: [id number] S: No new complaints and says feeling improved.  Did well with HD yesterday. Excited for d/c. Husband bedside this AM.   O:BP (!) 131/59 (BP Location: Left Arm)   Pulse 64   Temp 98.1 F (36.7 C) (Oral)   Resp (!) 52   Ht 5\' 5"  (1.651 m)   Wt 90.1 kg   SpO2 91%   BMI 33.05 kg/m   Intake/Output Summary (Last 24 hours) at 11/14/2022 1005 Last data filed at 11/13/2022 1800 Gross per 24 hour  Intake 180 ml  Output 2500 ml  Net -2320 ml   Intake/Output: Gen: NAD - looks less fatigued and more comfortable than days prior CV:  no rub Resp: on O2, a few wheezes, no rales; normal WOB at rest Abd: +BS, soft, NT/ND Ext: 1+ improving tibial edema  Recent Labs  Lab 11/08/22 0007 11/09/22 0018 11/10/22 0013 11/11/22 0000 11/12/22 0033 11/13/22 0023 11/14/22 0649  NA 136 134* 136 136 134* 135 135  K 4.1 3.8 3.9 4.3 4.1 4.0 3.8  CL 100 96* 97* 98 96* 98 98  CO2 25 27 27 25 28 28 27   GLUCOSE 139* 213* 159* 141* 202* 154* 204*  BUN 56* 57* 58* 56* 29* 35* 16  CREATININE 3.27* 3.27* 3.22* 3.32* 2.52* 3.25* 3.11*  ALBUMIN 2.4* 2.4* 2.4* 2.5* 2.5* 2.3* 2.6*  CALCIUM 8.6* 8.7* 8.8* 8.9 8.5* 8.4* 9.1  PHOS 4.8* 4.7* 4.3 4.2 3.2 4.0 3.3   Liver Function Tests: Recent Labs  Lab 11/12/22 0033 11/13/22 0023 11/14/22 0649  ALBUMIN 2.5* 2.3* 2.6*   No results for input(s): "LIPASE", "AMYLASE" in the last 168 hours. No results for input(s): "AMMONIA" in the last 168 hours. CBC: Recent Labs  Lab 11/09/22 0018 11/11/22 0000 11/12/22 0033 11/14/22 0649  WBC 10.3 8.9 10.0 9.5  HGB 8.5* 8.4* 8.5* 10.7*  HCT 28.4* 28.7* 29.0* 36.9  MCV 100.0 100.3* 97.3 97.9  PLT 400 402* 402* 394   Cardiac Enzymes: No results for input(s): "CKTOTAL", "CKMB", "CKMBINDEX", "TROPONINI" in the last 168 hours. CBG: Recent Labs  Lab 11/12/22 2105 11/13/22 0617 11/13/22 1433 11/13/22 2117 11/14/22 0544  GLUCAP 149*  120* 121* 175* 111*    Iron Studies: No results for input(s): "IRON", "TIBC", "TRANSFERRIN", "FERRITIN" in the last 72 hours. Studies/Results: No results found.  amLODipine  10 mg Oral Daily   apixaban  2.5 mg Oral BID   atorvastatin  40 mg Oral Daily   bisacodyl  10 mg Rectal Daily   Or   bisacodyl  10 mg Oral Daily   Chlorhexidine Gluconate Cloth  6 each Topical Daily   darbepoetin (ARANESP) injection - NON-DIALYSIS  100 mcg Subcutaneous Q Wed-1800   escitalopram  5 mg Oral Daily   guaiFENesin  600 mg Oral BID   insulin aspart  0-6 Units Subcutaneous TID WC   insulin aspart  2 Units Subcutaneous TID WC   isosorbide mononitrate  60 mg Oral Daily   levothyroxine  200 mcg Oral Q0600   lidocaine  1 patch Transdermal Q24H   metolazone  2.5 mg Oral Daily   multivitamin  1 tablet Oral QHS   mouth rinse  15 mL Mouth Rinse 4 times per day   sevelamer carbonate  800 mg Oral TID WC   sodium chloride flush  3 mL Intravenous Q12H   torsemide  100 mg Oral BID    BMET  Component Value Date/Time   NA 135 11/14/2022 0649   NA 140 02/16/2022 1008   K 3.8 11/14/2022 0649   CL 98 11/14/2022 0649   CO2 27 11/14/2022 0649   GLUCOSE 204 (H) 11/14/2022 0649   BUN 16 11/14/2022 0649   BUN 47 (H) 02/16/2022 1008   CREATININE 3.11 (H) 11/14/2022 0649   CREATININE 2.10 (H) 10/07/2022 0939   CALCIUM 9.1 11/14/2022 0649   CALCIUM 9.9 06/04/2008 2240   GFRNONAA 15 (L) 11/14/2022 0649   GFRAA 45 (L) 02/16/2020 1124   CBC    Component Value Date/Time   WBC 9.5 11/14/2022 0649   RBC 3.77 (L) 11/14/2022 0649   HGB 10.7 (L) 11/14/2022 0649   HGB 11.3 01/30/2021 1154   HCT 36.9 11/14/2022 0649   HCT 35.9 01/30/2021 1154   PLT 394 11/14/2022 0649   PLT 389 01/30/2021 1154   MCV 97.9 11/14/2022 0649   MCV 91 01/30/2021 1154   MCH 28.4 11/14/2022 0649   MCHC 29.0 (L) 11/14/2022 0649   RDW 19.5 (H) 11/14/2022 0649   RDW 13.4 01/30/2021 1154   LYMPHSABS 1.4 10/25/2022 0730   MONOABS 1.3  (H) 10/25/2022 0730   EOSABS 0.1 10/25/2022 0730   BASOSABS 0.0 10/25/2022 0730     Assessment/Plan:   Acute kidney injury on CKD stage IV likely due to CHF exacerbation/fluid overload/bradycardia leading to reduced renal perfusion.  B/L Cr around 2, followed by Dr. Noah Charon as an outpatient. She had hyperkalemia, anasarca and did not respond with IV diuretics.  Also had features of uremic encephalopathy including tremor, AMS. -temp HD catheter placed on 6/23, started CRRT 6/23. CRRT off on 6/24 given access issues/alarms. RIJ temp line removed 6/26. Uremic symptoms improved at that time   -Cr down to 2.95 but rose to 3.83 and up to 4.36.  Resumed her outpatient torsemide dose of 40 mg daily but worsening UOP and BUN/Cr.    - s/p TDC placement by IR 11/04/22 and had 1 session of IHD 11/05/22 and tolerated it well with UF of 1 liter.  Cr improved from 4.36 to 3.07 but had slowly been climbing but is now stable.  However, largest concern is fairly low UOP with high dose diuretic. Given ongoing difficulty with volume management resumed HD 7/10 and initiated process to pursue outpt AKI dialysis.  Has outpt HD arranged [facility] to start Monday.  Cont to monitor for signs of renal recovery but given protracted course may not end up recovering; considering AKI for now.   -She is interested in home therapies down the road if/when indicated.  She is a poor longterm HD candidate given her advanced age, multiple co-morbidities, and poor functional and nutritional status but she wishes to pursue   -normal renal artery duplex; no evidence of RAS -daily labs, strict I/O -cont to hold off on discharge for now pending arrangement of outpatient HD    Bradycardia: Seen by cardiology during admit. H/o PPM extraction secondary to bacteremia (Sep 07, 2022).  Off dopamine gtt, HR stable. Avoid beta blockers   Acute on chronic respiratory failure/respiratory distress with hypoxia: lasix as above. CXR 6/25 revealed  improvement in opacities. On 3L [place] at baseline @ home -improving, cont diuretics (if UOP remains low in coming days will d/c) and HD UF -Continue to dial down EDW at outpt HD.  -Cont oral diuretic torsemide 100 BID at d/c, no metolazone needed. -TED hose recommended  -low sodium, fluid restricted diet   Sepsis  due to possible pneumonia: s/p  antibiotics per primary team.   Anemia: transfuse for Hgb <7. ESA started 6/26, increased dose of aranesp to 100 mcg 7/3. Feraheme x 1 dose 6/28.   CKD MBD: PO4 high  and now normalized on renvela, c/w renal diet  HTN: well controlled now.    Ok for d/c today.    Estill Bakes MD [facility] Pager [phone]

## 2022-11-14 NOTE — TOC Transition Note (Addendum)
Transition of Care [facility]) - CM/SW Discharge Note   Patient Details  Name: Kayla [name] MRN: [id number] Date of Birth: Oct 05, 1940  Transition of [facility]) CM/SW Contact:  Ronny Bacon, RN Phone Number: 11/14/2022, 8:00 AM   Clinical Narrative:  Patient is being discharged home today. Spoke with patient by phone, reports that her wheelchair was not switched out. Reached out to [employer] liaison to inform that patient is being discharged and that wheelchair still needs to be switched out. Awaiting response. Confirmed with [facility] that patient is listed for [facility] RN, PT and OT. Husband will transport patient home at discharge.  [phone]- [employer] returned call, wheelchair to be changed out on Monday. Floor nurse made aware and can relay message to patient.    Final next level of care: Home w Home Health Services Barriers to Discharge: No Barriers Identified   Patient Goals and CMS Choice CMS Medicare.gov Compare Post Acute Care list provided to:: Patient Choice offered to / list presented to : Patient  Discharge Placement                         Discharge Plan and Services Additional resources added to the After Visit Summary for     Discharge Planning Services: CM Consult Post Acute Care Choice: Home Health          DME Arranged: Wheelchair manual DME Agency: [employer] Date DME Agency Contacted: 11/02/22 Time DME Agency Contacted: 1444 Representative spoke with at DME Agency: [facility]ack HH Arranged: RN, PT HH Agency: [facility] Date [facility] Agency Contacted: 10/29/22 Time HH Agency Contacted: 1525 Representative spoke with at [facility] Agency: Haywood Lasso  Social Determinants of Health (SDOH) Interventions SDOH Screenings   Food Insecurity: No Food Insecurity (11/04/2022)  Housing: Patient Declined (11/04/2022)  Transportation Needs: No Transportation Needs (11/04/2022)  Utilities: Not At Risk (11/04/2022)  Alcohol Screen: Low Risk  (01/31/2021)  Depression (PHQ2-9): Low  Risk  (10/15/2022)  Financial Resource Strain: Low Risk  (01/31/2021)  Physical Activity: Insufficiently Active (09/30/2021)  Social Connections: Socially Integrated (09/30/2021)  Stress: No Stress Concern Present (09/30/2021)  Tobacco Use: Medium Risk (10/25/2022)     Readmission Risk Interventions    10/01/2022    3:52 PM 01/22/2022    2:43 PM 07/23/2021   12:10 PM  Readmission Risk Prevention Plan  Transportation Screening Complete Complete Complete  PCP or Specialist Appt within 3-5 Days Complete Complete   HRI or Home Care Consult  Complete   Social Work Consult for Recovery Care Planning/Counseling Complete Complete   Palliative Care Screening Not Applicable Not Applicable   Medication Review [employer]) Referral to Pharmacy Complete Complete  HRI or Home Care Consult   Complete  SW Recovery Care/Counseling Consult   Complete  Palliative Care Screening   Not Applicable  Skilled Nursing Facility   Complete

## 2022-11-14 NOTE — Discharge Summary (Signed)
Physician Discharge Summary  Kayla [name] ZOX:[id number] DOB: 1941/01/30 DOA: 10/25/2022  PCP: Ailene Ravel, MD  Admit date: 10/25/2022 Discharge date: 11/14/2022  Admitted From: Home Disposition: Home  Recommendations for Outpatient Follow-up:  Follow up with PCP in 1-2 weeks Follow-up with nephrology/hemodialysis  Discharge Condition: Stable CODE STATUS: DNR Diet recommendation: Low-salt low-fat diet fluid restricted  Brief/Interim Summary: 82 year old Caucasian female, significantly obese, with recent history of MRSA bacteremia/infective endocarditis for which patient was on antibiotics for a prolonged time.  Other medical history includes coronary artery disease, carotid artery disease, chronic kidney disease stage IV, COPD, diabetes mellitus type 2, hypertension, hyperlipidemia, hypothyroidism and documented congestive heart failure.  Recent echocardiograms revealed normal left ventricular ejection fraction, without mention of diastolic dysfunction.  Patient was admitted with pulmonary edema, acute kidney injury on chronic kidney disease stage IV with oliguria versus anuria, requiring CRRT for about 48 hours and now HD.    Patient improving clinically, stable for discharge now that outpatient dialysis slot has been obtained.  Follow-up outpatient as above with PCP and nephrology.  Discharge Diagnoses:  Principal Problem:   Acute respiratory failure with hypoxia and hypercapnia (HCC) Active Problems:   AKI (acute kidney injury) (HCC)   Junctional bradycardia   Hyperkalemia  Acute kidney injury on chronic kidney disease stage IV/pulmonary edema: -Initiate HD on 7/10: -Follow outpatient with dialysis as scheduled   Acute on chronic hypoxic/hypercapnic respiratory failure, resolved: -Patient has been on oxygen for about 1 year (2 L/min via nasal cannula). -Etiology is likely multifactorial -most notably secondary to volume status requiring dialysis -Discussed need to comply  with CPAP management and, treatment of OSA/likely undiagnosed OHS.   COPD: -Without acute exacerbation -Continue current regimen.   Left lateral chest wall pain: -X-ray of the ribs revealed minimally displaced posterolateral left seventh rib fracture.  -Lidocaine patch to left lateral rib cage area.   Paroxysmal atrial fibrillation: -Controlled heart rate. -Continue apixaban 2.5 Mg p.o. twice daily.   Anemia of chronic kidney disease: -Patient has received IV iron. -Patient is on Aranesp every 2 weeks.   Hypertension: -Continue to optimize.   Diabetes mellitus:  -Resume home medications, diabetic diet   Hyperlipidemia: -Continue atorvastatin.   Possible community acquired pneumonia: -Findings/events may be suggestive of pulmonary edema. -abx stopped  Discharge Instructions  Discharge Instructions     Call MD for:  difficulty breathing, headache or visual disturbances   Complete by: As directed    Call MD for:  extreme fatigue   Complete by: As directed    Call MD for:  persistant dizziness or light-headedness   Complete by: As directed    Call MD for:  persistant nausea and vomiting   Complete by: As directed    Call MD for:  severe uncontrolled pain   Complete by: As directed    Call MD for:  temperature >100.4   Complete by: As directed    Diet - low sodium heart healthy   Complete by: As directed    Increase activity slowly   Complete by: As directed    No wound care   Complete by: As directed       Allergies as of 11/14/2022       Reactions   Dulaglutide Nausea Only, Other (See Comments), Cough   TRULICITY made the patient sick to her stomach (only) several days after using it   Oxycodone Itching, Other (See Comments)   Pt still takes this med even thought it causes itching  Trelegy Ellipta [fluticasone-umeclidin-vilant] Nausea And Vomiting, Other (See Comments), Cough   Made the patient sick to her stomach (only) several days after using it          Medication List     STOP taking these medications    amiodarone 200 MG tablet Commonly known as: PACERONE   insulin glargine 100 UNIT/ML injection Commonly known as: LANTUS   losartan 25 MG tablet Commonly known as: COZAAR       TAKE these medications    acetaminophen 500 MG tablet Commonly known as: TYLENOL Take 1,000-1,500 mg by mouth every 8 (eight) hours as needed for mild pain, moderate pain or headache.   albuterol 108 (90 Base) MCG/ACT inhaler Commonly known as: VENTOLIN HFA Inhale 2 puffs into the lungs every 6 (six) hours as needed for wheezing or shortness of breath.   albuterol (2.5 MG/3ML) 0.083% nebulizer solution Commonly known as: PROVENTIL Take 3 mLs (2.5 mg total) by nebulization every 2 (two) hours as needed for wheezing or shortness of breath.   allopurinol 100 MG tablet Commonly known as: ZYLOPRIM Take 100 mg by mouth 2 (two) times daily.   amLODipine 5 MG tablet Commonly known as: NORVASC Take 2 tablets (10 mg total) by mouth daily. What changed: how much to take   ARTIFICIAL TEARS OP Place 1 drop into both eyes daily as needed (dry eyes).   ascorbic acid 500 MG tablet Commonly known as: VITAMIN C Take 500 mg by mouth daily with lunch.   atorvastatin 40 MG tablet Commonly known as: LIPITOR Take 1 tablet (40 mg total) by mouth daily. What changed: when to take this   B-D SINGLE USE SWABS REGULAR Pads   Breztri Aerosphere 160-9-4.8 MCG/ACT Aero Generic drug: Budeson-Glycopyrrol-Formoterol Inhale 2 puffs into the lungs in the morning and at bedtime.   cyanocobalamin 1000 MCG tablet Commonly known as: VITAMIN B12 Take 1,000 mcg by mouth daily with lunch.   Eliquis 2.5 MG Tabs tablet Generic drug: apixaban TAKE 1 TABLET TWICE DAILY   escitalopram 5 MG tablet Commonly known as: LEXAPRO Take 1 tablet (5 mg total) by mouth daily.   ferrous sulfate 325 (65 FE) MG tablet Take 1 tablet (325 mg total) by mouth daily with  breakfast.   fluticasone 50 MCG/ACT nasal spray Commonly known as: FLONASE USE 2 SPRAYS NASALLY DAILY What changed:  how much to take how to take this when to take this reasons to take this additional instructions   gabapentin 800 MG tablet Commonly known as: NEURONTIN Take 800 mg by mouth 3 (three) times daily.   guaiFENesin 100 MG/5ML liquid Commonly known as: ROBITUSSIN Take 5 mLs by mouth every 4 (four) hours as needed for cough or to loosen phlegm.   HYDROcodone-acetaminophen 10-325 MG tablet Commonly known as: NORCO Take 1 tablet by mouth every 6 (six) hours as needed. What changed: reasons to take this   HYDROXYZINE HCL PO Take 25 mg by mouth at bedtime as needed (insomnia).   insulin aspart 100 UNIT/ML FlexPen Commonly known as: NOVOLOG Inject 0-20 Units into the skin See admin instructions. Inject 0-20 units into the skin three times a day before meals "as needed," per SLIDING SCALE   isosorbide mononitrate 60 MG 24 hr tablet Commonly known as: IMDUR Take 1 tablet (60 mg total) by mouth daily.   levothyroxine 200 MCG tablet Commonly known as: SYNTHROID Take 200 mcg by mouth daily before breakfast.   loratadine 10 MG tablet Commonly known as: CLARITIN Take 1  tablet (10 mg total) by mouth daily. What changed:  when to take this reasons to take this   melatonin 5 MG Tabs Take 1 tablet (5 mg total) by mouth at bedtime as needed.   metolazone 2.5 MG tablet Commonly known as: ZAROXOLYN Take 1 tablet (2.5 mg total) by mouth daily.   multivitamin Tabs tablet Take 1 tablet by mouth at bedtime.   nitroGLYCERIN 0.4 MG SL tablet Commonly known as: NITROSTAT Place 1 tablet (0.4 mg total) under the tongue every 5 (five) minutes as needed for chest pain.   polyethylene glycol 17 g packet Commonly known as: MIRALAX / GLYCOLAX Take 17 g by mouth daily as needed for mild constipation or moderate constipation.   PRESCRIPTION MEDICATION CPAP- At bedtime and as  needed during any time of rest   sevelamer carbonate 800 MG tablet Commonly known as: RENVELA Take 1 tablet (800 mg total) by mouth 3 (three) times daily with meals.   torsemide 100 MG tablet Commonly known as: DEMADEX Take 1 tablet (100 mg total) by mouth 2 (two) times daily. What changed:  medication strength how much to take when to take this   True Metrix Blood Glucose Test test strip Generic drug: glucose blood   True Metrix Level 1 Low Soln   TRUEplus Lancets 28G Misc   vitamin D3 25 MCG tablet Commonly known as: CHOLECALCIFEROL Take 1,000 Units by mouth daily with lunch.               Durable Medical Equipment  (From admission, onward)           Start     Ordered   11/02/22 1440  For home use only DME lightweight manual wheelchair with seat cushion  Once       Comments: Patient suffers from chf  which impairs their ability to perform daily activities like bathing and dressing in the home.  A walker will not resolve  issue with performing activities of daily living. A wheelchair will allow patient to safely perform daily activities. Patient is not able to propel themselves in the home using a standard weight wheelchair due to general weakness. Patient can self propel in the lightweight wheelchair. Length of need Lifetime. Accessories: elevating leg rests (ELRs), wheel locks, extensions and anti-tippers.   11/02/22 1440            Contact information for follow-up providers     [facility] Follow up.   Specialty: Home Health Services Why: Home health has been arranged. They will contact you to schedule apt within 48hrs post discharge. Contact information: [address] [place] [address] [phone]         [facility] Go on 11/16/2022.   Why: Schedule is Monday, Wednesday, Friday with 10:30 chair time.  For first appointment, please arrive at 9:45 to complete paperwork prior to  treatment. Contact information: [address] [place] [address] [phone]              Contact information for after-discharge care     Destination     [facility] Preferred SNF .   Service: Skilled Nursing Contact information: [address] [place] [address] [phone]                    Allergies  Allergen Reactions   Dulaglutide Nausea Only, Other (See Comments) and Cough    TRULICITY made the  patient sick to her stomach (only) several days after using it   Oxycodone Itching and Other (See Comments)    Pt still takes this med even thought it causes itching   Trelegy Ellipta [Fluticasone-Umeclidin-Vilant] Nausea And Vomiting, Other (See Comments) and Cough    Made the patient sick to her stomach (only) several days after using it     Consultations: Nephrology  Procedures/Studies: IR US Guide Vasc Access Right  Result Date: 11/10/2022 INDICATION: 82 year old female in need of hemodialysis. She recently dislodged her right IJ tunneled hemodialysis catheter and requires placement of a new device. EXAM: TUNNELED CENTRAL VENOUS HEMODIALYSIS CATHETER PLACEMENT WITH ULTRASOUND AND FLUOROSCOPIC GUIDANCE MEDICATIONS: No additional antibiotics administered. ANESTHESIA/SEDATION: 1 mg Versed administered for anxiolysis. FLUOROSCOPY TIME:  Radiation exposure index: 3 mGy reference air kerma COMPLICATIONS: None immediate. PROCEDURE: Informed written consent was obtained from the patient after a discussion of the risks, benefits, and alternatives to treatment. Questions regarding the procedure were encouraged and answered. The right neck and chest were prepped with chlorhexidine in a sterile fashion, and a sterile drape was applied covering the operative field. Maximum barrier sterile technique with sterile gowns and gloves were used for the procedure. A timeout was performed prior to the initiation of the  procedure. After creating a small venotomy incision, a micropuncture kit was utilized to access the right internal jugular vein under direct, real-time ultrasound guidance after the overlying soft tissues were anesthetized with 1% lidocaine with epinephrine. Ultrasound image documentation was performed. The microwire was kinked to measure appropriate catheter length. A stiff Glidewire was advanced to the level of the IVC and the micropuncture sheath was exchanged for a peel-away sheath. A palindrome tunneled hemodialysis catheter measuring 19 cm from tip to cuff was tunneled in a retrograde fashion from the anterior chest wall to the venotomy incision. The catheter was then placed through the peel-away sheath with tips ultimately positioned within the superior aspect of the right atrium. Final catheter positioning was confirmed and documented with a spot radiographic image. The catheter aspirates and flushes normally. The catheter was flushed with appropriate volume heparin dwells. The catheter exit site was secured with a 0-Prolene retention suture. The venotomy incision was closed with Dermabond. Dressings were applied. The patient tolerated the procedure well without immediate post procedural complication. IMPRESSION: Successful placement of 19 cm tip to cuff tunneled hemodialysis catheter via the right internal jugular vein with tips terminating within the superior aspect of the right atrium. The catheter is ready for immediate use. Electronically Signed   By: Malachy Moan M.D.   On: 11/10/2022 08:42   IR Fluoro Guide CV Line Right  Result Date: 11/04/2022 INDICATION: 82 year old female in need of hemodialysis. She recently dislodged her right IJ tunneled hemodialysis catheter and requires placement of a new device. EXAM: TUNNELED CENTRAL VENOUS HEMODIALYSIS CATHETER PLACEMENT WITH ULTRASOUND AND FLUOROSCOPIC GUIDANCE MEDICATIONS: No additional antibiotics administered. ANESTHESIA/SEDATION: 1 mg Versed  administered for anxiolysis. FLUOROSCOPY TIME:  Radiation exposure index: 3 mGy reference air kerma COMPLICATIONS: None immediate. PROCEDURE: Informed written consent was obtained from the patient after a discussion of the risks, benefits, and alternatives to treatment. Questions regarding the procedure were encouraged and answered. The right neck and chest were prepped with chlorhexidine in a sterile fashion, and a sterile drape was applied covering the operative field. Maximum barrier sterile technique with sterile gowns and gloves were used for the procedure. A timeout was performed prior to the initiation of the procedure. After creating a small venotomy incision,  a micropuncture kit was utilized to access the right internal jugular vein under direct, real-time ultrasound guidance after the overlying soft tissues were anesthetized with 1% lidocaine with epinephrine. Ultrasound image documentation was performed. The microwire was kinked to measure appropriate catheter length. A stiff Glidewire was advanced to the level of the IVC and the micropuncture sheath was exchanged for a peel-away sheath. A palindrome tunneled hemodialysis catheter measuring 19 cm from tip to cuff was tunneled in a retrograde fashion from the anterior chest wall to the venotomy incision. The catheter was then placed through the peel-away sheath with tips ultimately positioned within the superior aspect of the right atrium. Final catheter positioning was confirmed and documented with a spot radiographic image. The catheter aspirates and flushes normally. The catheter was flushed with appropriate volume heparin dwells. The catheter exit site was secured with a 0-Prolene retention suture. The venotomy incision was closed with Dermabond. Dressings were applied. The patient tolerated the procedure well without immediate post procedural complication. IMPRESSION: Successful placement of 19 cm tip to cuff tunneled hemodialysis catheter via the  right internal jugular vein with tips terminating within the superior aspect of the right atrium. The catheter is ready for immediate use. Electronically Signed   By: Malachy Moan M.D.   On: 11/04/2022 15:08   DG Ribs Unilateral Left  Result Date: 11/01/2022 CLINICAL DATA:  Left rib fracture. EXAM: LEFT RIBS - 2 VIEW COMPARISON:  Chest x-ray 10/31/2022 FINDINGS: Minimally displaced posterolateral left seventh rib fracture. Remainder of the exam is unchanged. IMPRESSION: Minimally displaced posterolateral left seventh rib fracture. Electronically Signed   By: Elberta Fortis M.D.   On: 11/01/2022 15:23   VAS US RENAL ARTERY DUPLEX  Result Date: 11/01/2022 ABDOMINAL VISCERAL Patient Name:  Kayla [name]  Date of Exam:   11/01/2022 Medical Rec #: [id number]         Accession #:    [id number] Date of Birth: Jan 17, 1941        Patient Gender: F Patient Age:   81 years Exam Location:  [facility] Procedure:      VAS US RENAL ARTERY DUPLEX Referring Phys: [id number] SYLVESTER I OGBATA -------------------------------------------------------------------------------- Indications: Stenosis, renal I70.1 High Risk Factors: Hypertension, hyperlipidemia, Diabetes. Limitations: Air/bowel gas, obesity, patient discomfort and patient positioning, patient constant movement, respiratory disturbance. Comparison Study: No prior studies. Performing Technologist: Chanda Busing RVT  Examination Guidelines: A complete evaluation includes B-mode imaging, spectral Doppler, color Doppler, and power Doppler as needed of all accessible portions of each vessel. Bilateral testing is considered an integral part of a complete examination. Limited examinations for reoccurring indications may be performed as noted.  Duplex Findings: +--------------------+--------+--------+------+------------------+ Mesenteric          PSV cm/sEDV cm/sPlaque     Comments      +--------------------+--------+--------+------+------------------+ Aorta  Mid             221      22                            +--------------------+--------+--------+------+------------------+ Celiac Artery Origin                      Unable to insonate +--------------------+--------+--------+------+------------------+ SMA Origin                                Unable to insonate +--------------------+--------+--------+------+------------------+    +------------------+--------+--------+-------+  Right Renal ArteryPSV cm/sEDV cm/sComment +------------------+--------+--------+-------+ Origin               19      8            +------------------+--------+--------+-------+ Proximal             24      8            +------------------+--------+--------+-------+ Mid                  42      9            +------------------+--------+--------+-------+ Distal               37      10           +------------------+--------+--------+-------+ +-----------------+--------+--------+-------+ Left Renal ArteryPSV cm/sEDV cm/sComment +-----------------+--------+--------+-------+ Origin              59      9            +-----------------+--------+--------+-------+ Proximal            70      5            +-----------------+--------+--------+-------+ Mid                 67      10           +-----------------+--------+--------+-------+ Distal              41      6            +-----------------+--------+--------+-------+ +------------+--------+--------+----+-----------+--------+--------+----+ Right KidneyPSV cm/sEDV cm/sRI  Left KidneyPSV cm/sEDV cm/sRI   +------------+--------+--------+----+-----------+--------+--------+----+ Upper Pole  29      5       0.82Upper Pole 23      4       0.82 +------------+--------+--------+----+-----------+--------+--------+----+ Mid         27      4       0.        21      5       0.77 +------------+--------+--------+----+-----------+--------+--------+----+ Lower Pole  22       4       0.80Lower Pole 25      6       0.76 +------------+--------+--------+----+-----------+--------+--------+----+ Hilar       43      8       0.81Hilar      28      6       0.78 +------------+--------+--------+----+-----------+--------+--------+----+ +------------------+----+------------------+-----+ Right Kidney          Left Kidney             +------------------+----+------------------+-----+ RAR                   RAR                     +------------------+----+------------------+-----+ RAR (manual)      0.19RAR (manual)      0.32  +------------------+----+------------------+-----+ Cortex                Cortex                  +------------------+----+------------------+-----+ Cortex thickness      Corex thickness         +------------------+----+------------------+-----+ Kidney length (cm)9.30Kidney length (cm)10.00 +------------------+----+------------------+-----+  Summary: Renal:  Right: Normal size right kidney.  Normal right Resisitive Index. No        evidence of right renal artery stenosis. Left:  No evidence of left renal artery stenosis. Normal size of        left kidney. Normal left Resistive Index.  *See table(s) above for measurements and observations.  Diagnosing physician: Coral Else MD  Electronically signed by Coral Else MD on 11/01/2022 at 11:17:34 AM.    Final    DG CHEST PORT 1 VIEW  Result Date: 10/31/2022 CLINICAL DATA:  Shortness of breath. EXAM: PORTABLE CHEST 1 VIEW COMPARISON:  10/27/2022 FINDINGS: Patient is slightly rotated to the right. Lungs are adequately inflated and demonstrate mild hazy prominence of the central pulmonary vessels suggesting mild vascular congestion. There is mild bibasilar opacification likely small amount of bilateral pleural fluid with associated atelectasis. Mild stable cardiomegaly. Remainder of the exam is unchanged. IMPRESSION: Mild vascular congestion with small amount of bilateral pleural fluid  and associated bibasilar atelectasis. Infection in the lung bases is possible. Electronically Signed   By: Elberta Fortis M.D.   On: 10/31/2022 08:42   DG CHEST PORT 1 VIEW  Result Date: 10/27/2022 CLINICAL DATA:  Acute hypercapnic respiratory failure. EXAM: PORTABLE CHEST 1 VIEW COMPARISON:  October 25, 2022. FINDINGS: Stable cardiomegaly. Right internal jugular catheter is noted with tip in expected position of the SVC. Slightly decreased bibasilar opacities are noted suggesting improving atelectasis or edema. Small pleural effusions may be present. Bony thorax is unremarkable. IMPRESSION: Improved bibasilar opacities as described above. Electronically Signed   By: Lupita Raider M.D.   On: 10/27/2022 11:30   ECHOCARDIOGRAM LIMITED  Result Date: 10/25/2022    ECHOCARDIOGRAM LIMITED REPORT   Patient Name:   Kayla [name] Date of Exam: 10/25/2022 Medical Rec #:  [id number]        Height:       65.0 in Accession #:    [id number]       Weight:       230.0 lb Date of Birth:  02-07-1941       BSA:          2.099 m Patient Age:    81 years         BP:           88/55 mmHg Patient Gender: F                HR:           38 bpm. Exam Location:  Inpatient Procedure: Limited Echo Indications:    R06.9 DOE  History:        Patient has prior history of Echocardiogram examinations, most                 recent 09/07/2022. COPD, Arrythmias:Atrial Fibrillation; Risk                 Factors:Hypertension, Diabetes, Dyslipidemia and Sleep Apnea.                 Infected Pacemaker lead removed 09/08/22 without re-implant.  Sonographer:    Irving Burton Senior RDCS Referring Phys: [id number] Perlie Gold  Sonographer Comments: Limited for LV function, scanned upright on bipap for dyspnea IMPRESSIONS  1. Left ventricular ejection fraction, by estimation, is 70 to 75%. Left ventricular ejection fraction by PLAX is 66 %. The left ventricle has hyperdynamic function. The left ventricle has no regional wall motion abnormalities. There is mild  concentric left ventricular hypertrophy.  2. There is mild calcification of  the aortic valve. There is moderate thickening of the aortic valve. FINDINGS  Left Ventricle: Left ventricular ejection fraction, by estimation, is 70 to 75%. Left ventricular ejection fraction by PLAX is 66 %. The left ventricle has hyperdynamic function. The left ventricle has no regional wall motion abnormalities. The left ventricular internal cavity size was normal in size. There is mild concentric left ventricular hypertrophy. Pericardium: There is no evidence of pericardial effusion. Mitral Valve: Mild to moderate mitral annular calcification. Aortic Valve: There is mild calcification of the aortic valve. There is moderate thickening of the aortic valve. LEFT VENTRICLE PLAX 2D LV EF:         Left ventricular ejection fraction by PLAX is 66 %. LVIDd:         4.90 cm LVIDs:         3.10 cm LV PW:         1.20 cm LV IVS:        1.20 cm  Rachelle Hora Croitoru MD Electronically signed by Thurmon Fair MD Signature Date/Time: 10/25/2022/12:43:51 PM    Final    DG Chest Portable 1 View  Result Date: 10/25/2022 CLINICAL DATA:  Respiratory distress. EXAM: PORTABLE CHEST 1 VIEW COMPARISON:  09/11/2022 FINDINGS: Rotated film. The cardio pericardial silhouette is enlarged. Bilateral diffuse, basilar predominant airspace disease is stable to mildly progressive in the interval. Left lung base is not been included on the study. Bilateral pleural effusions suspected. Bones are diffusely demineralized. Telemetry leads overlie the chest. IMPRESSION: 1. Rotated film with bilateral diffuse, basilar predominant airspace disease, stable to mildly progressive in the interval. Imaging features likely reflect edema although diffuse infection could have this appearance. 2. Suspect bilateral pleural effusions. 3. Left lung base not included on the study. Electronically Signed   By: Kennith Center M.D.   On: 10/25/2022 07:48     Subjective: No acute issues or  events overnight   Discharge Exam: Vitals:   11/13/22 1428 11/13/22 2035  BP: (!) 154/51 (!) 131/59  Pulse: (!) 54 64  Resp: 15 13  Temp: 98.7 F (37.1 C) 97.6 F (36.4 C)  SpO2: 95% 91%   Vitals:   11/13/22 1348 11/13/22 1428 11/13/22 2035 11/14/22 0530  BP: (!) 178/55 (!) 154/51 (!) 131/59   Pulse: (!) 52 (!) 54 64   Resp: 20 15 13    Temp: 98.3 F (36.8 C) 98.7 F (37.1 C) 97.6 F (36.4 C)   TempSrc:  Oral Oral   SpO2: 97% 95% 91%   Weight: (S) 91.2 kg   90.1 kg  Height:        General: Pt is alert, awake, not in acute distress Cardiovascular: RRR, S1/S2 +, no rubs, no gallops Respiratory: CTA bilaterally, no wheezing, no rhonchi Abdominal: Soft, NT, ND, bowel sounds + Extremities: Temporary dialysis catheter right IJ    The results of significant diagnostics from this hospitalization (including imaging, microbiology, ancillary and laboratory) are listed below for reference.     Microbiology: No results found for this or any previous visit (from the past 240 hour(s)).   Labs: BNP (last 3 results) Recent Labs    10/25/22 0731  BNP 819.8*   Basic Metabolic Panel: Recent Labs  Lab 11/10/22 0013 11/11/22 0000 11/12/22 0033 11/13/22 0023 11/14/22 0649  NA 136 136 134* 135 135  K 3.9 4.3 4.1 4.0 3.8  CL 97* 98 96* 98 98  CO2 27 25 28 28 27   GLUCOSE 159* 141* 202* 154* 204*  BUN 58*  56* 29* 35* 16  CREATININE 3.22* 3.32* 2.52* 3.25* 3.11*  CALCIUM 8.8* 8.9 8.5* 8.4* 9.1  MG 1.8 1.9 1.6* 2.0 1.8  PHOS 4.3 4.2 3.2 4.0 3.3   Liver Function Tests: Recent Labs  Lab 11/10/22 0013 11/11/22 0000 11/12/22 0033 11/13/22 0023 11/14/22 0649  ALBUMIN 2.4* 2.5* 2.5* 2.3* 2.6*   CBC: Recent Labs  Lab 11/09/22 0018 11/11/22 0000 11/12/22 0033 11/14/22 0649  WBC 10.3 8.9 10.0 9.5  HGB 8.5* 8.4* 8.5* 10.7*  HCT 28.4* 28.7* 29.0* 36.9  MCV 100.0 100.3* 97.3 97.9  PLT 400 402* 402* 394   CBG: Recent Labs  Lab 11/12/22 2105 11/13/22 0617  11/13/22 1433 11/13/22 2117 11/14/22 0544  GLUCAP 149* 120* 121* 175* 111*   Urinalysis    Component Value Date/Time   COLORURINE AMBER (A) 11/03/2022 1535   APPEARANCEUR CLOUDY (A) 11/03/2022 1535   LABSPEC 1.019 11/03/2022 1535   PHURINE 5.0 11/03/2022 1535   GLUCOSEU NEGATIVE 11/03/2022 1535   GLUCOSEU NEGATIVE 12/09/2018 0944   HGBUR NEGATIVE 11/03/2022 1535   BILIRUBINUR NEGATIVE 11/03/2022 1535   KETONESUR NEGATIVE 11/03/2022 1535   PROTEINUR >=300 (A) 11/03/2022 1535   UROBILINOGEN 0.2 12/09/2018 0944   NITRITE NEGATIVE 11/03/2022 1535   LEUKOCYTESUR LARGE (A) 11/03/2022 1535   Sepsis Labs Recent Labs  Lab 11/09/22 0018 11/11/22 0000 11/12/22 0033 11/14/22 0649  WBC 10.3 8.9 10.0 9.5   Microbiology No results found for this or any previous visit (from the past 240 hour(s)).   Time coordinating discharge: Over 30 minutes  SIGNED:   Azucena Fallen, DO [facility] 11/14/2022, 7:34 AM Pager   If 7PM-7AM, please contact night-coverage [email or web address]

## 2022-11-15 ENCOUNTER — Telehealth: Payer: Self-pay | Admitting: Nephrology

## 2022-11-15 NOTE — Telephone Encounter (Signed)
Transition of Care - Initial Contact from Inpatient Facility  Date of discharge: 11/14/22 Date of contact: 11/15/22  Method: Phone Spoke to: Patient   and her husband   Patient contacted to discuss transition of care from recent inpatient hospitalization. Patient was admitted to [facility] from .10/25/22 to 11/14/22.. with discharge diagnosis of .Acute kidney injury on chronic kidney disease stage IV/pulmonary edema: -Initiate HD on 7/10/..Acute on chronic hypoxic/hypercapnic respiratory failure, Paroxysmal atrial fibrillation, Possible community acquired pneumonia ,Anemia of chronic kidney disease   The discharge medication list was reviewed. Patient understands the changes and has no concerns.   Patient will return to his/her outpatient HD unit on:   No other concerns at this time.

## 2022-11-16 DIAGNOSIS — N179 Acute kidney failure, unspecified: Secondary | ICD-10-CM | POA: Diagnosis not present

## 2022-11-16 DIAGNOSIS — R6889 Other general symptoms and signs: Secondary | ICD-10-CM | POA: Diagnosis not present

## 2022-11-16 DIAGNOSIS — Z992 Dependence on renal dialysis: Secondary | ICD-10-CM | POA: Diagnosis not present

## 2022-11-16 NOTE — Progress Notes (Signed)
Late Note Entry  Pt d/c on Saturday. Contacted [facility] this morning to advise clinic of pt's d/c date and that pt should start today as planned.   Olivia Canter Renal Navigator [phone]

## 2022-11-17 ENCOUNTER — Ambulatory Visit: Payer: Medicare HMO | Admitting: Internal Medicine

## 2022-11-18 DIAGNOSIS — Z992 Dependence on renal dialysis: Secondary | ICD-10-CM | POA: Diagnosis not present

## 2022-11-18 DIAGNOSIS — N179 Acute kidney failure, unspecified: Secondary | ICD-10-CM | POA: Diagnosis not present

## 2022-11-18 DIAGNOSIS — R6889 Other general symptoms and signs: Secondary | ICD-10-CM | POA: Diagnosis not present

## 2022-11-19 DIAGNOSIS — M86172 Other acute osteomyelitis, left ankle and foot: Secondary | ICD-10-CM | POA: Diagnosis not present

## 2022-11-19 DIAGNOSIS — I480 Paroxysmal atrial fibrillation: Secondary | ICD-10-CM | POA: Diagnosis not present

## 2022-11-19 DIAGNOSIS — T827XXA Infection and inflammatory reaction due to other cardiac and vascular devices, implants and grafts, initial encounter: Secondary | ICD-10-CM | POA: Diagnosis not present

## 2022-11-19 DIAGNOSIS — E1151 Type 2 diabetes mellitus with diabetic peripheral angiopathy without gangrene: Secondary | ICD-10-CM | POA: Diagnosis not present

## 2022-11-19 DIAGNOSIS — I872 Venous insufficiency (chronic) (peripheral): Secondary | ICD-10-CM | POA: Diagnosis not present

## 2022-11-19 DIAGNOSIS — B9562 Methicillin resistant Staphylococcus aureus infection as the cause of diseases classified elsewhere: Secondary | ICD-10-CM | POA: Diagnosis not present

## 2022-11-19 DIAGNOSIS — Z4781 Encounter for orthopedic aftercare following surgical amputation: Secondary | ICD-10-CM | POA: Diagnosis not present

## 2022-11-19 DIAGNOSIS — J449 Chronic obstructive pulmonary disease, unspecified: Secondary | ICD-10-CM | POA: Diagnosis not present

## 2022-11-19 DIAGNOSIS — E1169 Type 2 diabetes mellitus with other specified complication: Secondary | ICD-10-CM | POA: Diagnosis not present

## 2022-11-20 DIAGNOSIS — R6889 Other general symptoms and signs: Secondary | ICD-10-CM | POA: Diagnosis not present

## 2022-11-20 DIAGNOSIS — N179 Acute kidney failure, unspecified: Secondary | ICD-10-CM | POA: Diagnosis not present

## 2022-11-20 DIAGNOSIS — Z992 Dependence on renal dialysis: Secondary | ICD-10-CM | POA: Diagnosis not present

## 2022-11-23 DIAGNOSIS — N179 Acute kidney failure, unspecified: Secondary | ICD-10-CM | POA: Diagnosis not present

## 2022-11-23 DIAGNOSIS — Z992 Dependence on renal dialysis: Secondary | ICD-10-CM | POA: Diagnosis not present

## 2022-11-24 DIAGNOSIS — M86172 Other acute osteomyelitis, left ankle and foot: Secondary | ICD-10-CM | POA: Diagnosis not present

## 2022-11-24 DIAGNOSIS — B9562 Methicillin resistant Staphylococcus aureus infection as the cause of diseases classified elsewhere: Secondary | ICD-10-CM | POA: Diagnosis not present

## 2022-11-24 DIAGNOSIS — E1169 Type 2 diabetes mellitus with other specified complication: Secondary | ICD-10-CM | POA: Diagnosis not present

## 2022-11-24 DIAGNOSIS — E1151 Type 2 diabetes mellitus with diabetic peripheral angiopathy without gangrene: Secondary | ICD-10-CM | POA: Diagnosis not present

## 2022-11-24 DIAGNOSIS — I872 Venous insufficiency (chronic) (peripheral): Secondary | ICD-10-CM | POA: Diagnosis not present

## 2022-11-24 DIAGNOSIS — I480 Paroxysmal atrial fibrillation: Secondary | ICD-10-CM | POA: Diagnosis not present

## 2022-11-24 DIAGNOSIS — T827XXA Infection and inflammatory reaction due to other cardiac and vascular devices, implants and grafts, initial encounter: Secondary | ICD-10-CM | POA: Diagnosis not present

## 2022-11-24 DIAGNOSIS — Z4781 Encounter for orthopedic aftercare following surgical amputation: Secondary | ICD-10-CM | POA: Diagnosis not present

## 2022-11-24 DIAGNOSIS — J449 Chronic obstructive pulmonary disease, unspecified: Secondary | ICD-10-CM | POA: Diagnosis not present

## 2022-11-25 DIAGNOSIS — Z992 Dependence on renal dialysis: Secondary | ICD-10-CM | POA: Diagnosis not present

## 2022-11-25 DIAGNOSIS — N179 Acute kidney failure, unspecified: Secondary | ICD-10-CM | POA: Diagnosis not present

## 2022-11-26 DIAGNOSIS — B9562 Methicillin resistant Staphylococcus aureus infection as the cause of diseases classified elsewhere: Secondary | ICD-10-CM | POA: Diagnosis not present

## 2022-11-26 DIAGNOSIS — E1151 Type 2 diabetes mellitus with diabetic peripheral angiopathy without gangrene: Secondary | ICD-10-CM | POA: Diagnosis not present

## 2022-11-26 DIAGNOSIS — M86172 Other acute osteomyelitis, left ankle and foot: Secondary | ICD-10-CM | POA: Diagnosis not present

## 2022-11-26 DIAGNOSIS — I480 Paroxysmal atrial fibrillation: Secondary | ICD-10-CM | POA: Diagnosis not present

## 2022-11-26 DIAGNOSIS — J449 Chronic obstructive pulmonary disease, unspecified: Secondary | ICD-10-CM | POA: Diagnosis not present

## 2022-11-26 DIAGNOSIS — E1169 Type 2 diabetes mellitus with other specified complication: Secondary | ICD-10-CM | POA: Diagnosis not present

## 2022-11-26 DIAGNOSIS — T827XXA Infection and inflammatory reaction due to other cardiac and vascular devices, implants and grafts, initial encounter: Secondary | ICD-10-CM | POA: Diagnosis not present

## 2022-11-26 DIAGNOSIS — I872 Venous insufficiency (chronic) (peripheral): Secondary | ICD-10-CM | POA: Diagnosis not present

## 2022-11-26 DIAGNOSIS — Z4781 Encounter for orthopedic aftercare following surgical amputation: Secondary | ICD-10-CM | POA: Diagnosis not present

## 2022-11-27 DIAGNOSIS — Z992 Dependence on renal dialysis: Secondary | ICD-10-CM | POA: Diagnosis not present

## 2022-11-27 DIAGNOSIS — N179 Acute kidney failure, unspecified: Secondary | ICD-10-CM | POA: Diagnosis not present

## 2022-11-30 DIAGNOSIS — N179 Acute kidney failure, unspecified: Secondary | ICD-10-CM | POA: Diagnosis not present

## 2022-11-30 DIAGNOSIS — Z992 Dependence on renal dialysis: Secondary | ICD-10-CM | POA: Diagnosis not present

## 2022-12-01 DIAGNOSIS — E1169 Type 2 diabetes mellitus with other specified complication: Secondary | ICD-10-CM | POA: Diagnosis not present

## 2022-12-01 DIAGNOSIS — E1140 Type 2 diabetes mellitus with diabetic neuropathy, unspecified: Secondary | ICD-10-CM | POA: Diagnosis not present

## 2022-12-01 DIAGNOSIS — J449 Chronic obstructive pulmonary disease, unspecified: Secondary | ICD-10-CM | POA: Diagnosis not present

## 2022-12-01 DIAGNOSIS — Z794 Long term (current) use of insulin: Secondary | ICD-10-CM | POA: Diagnosis not present

## 2022-12-01 DIAGNOSIS — F419 Anxiety disorder, unspecified: Secondary | ICD-10-CM | POA: Diagnosis not present

## 2022-12-01 DIAGNOSIS — B9562 Methicillin resistant Staphylococcus aureus infection as the cause of diseases classified elsewhere: Secondary | ICD-10-CM | POA: Diagnosis not present

## 2022-12-01 DIAGNOSIS — N186 End stage renal disease: Secondary | ICD-10-CM | POA: Diagnosis not present

## 2022-12-01 DIAGNOSIS — Z4781 Encounter for orthopedic aftercare following surgical amputation: Secondary | ICD-10-CM | POA: Diagnosis not present

## 2022-12-01 DIAGNOSIS — I872 Venous insufficiency (chronic) (peripheral): Secondary | ICD-10-CM | POA: Diagnosis not present

## 2022-12-01 DIAGNOSIS — L03115 Cellulitis of right lower limb: Secondary | ICD-10-CM | POA: Diagnosis not present

## 2022-12-01 DIAGNOSIS — E1151 Type 2 diabetes mellitus with diabetic peripheral angiopathy without gangrene: Secondary | ICD-10-CM | POA: Diagnosis not present

## 2022-12-01 DIAGNOSIS — T827XXA Infection and inflammatory reaction due to other cardiac and vascular devices, implants and grafts, initial encounter: Secondary | ICD-10-CM | POA: Diagnosis not present

## 2022-12-01 DIAGNOSIS — I480 Paroxysmal atrial fibrillation: Secondary | ICD-10-CM | POA: Diagnosis not present

## 2022-12-01 DIAGNOSIS — Z992 Dependence on renal dialysis: Secondary | ICD-10-CM | POA: Diagnosis not present

## 2022-12-01 DIAGNOSIS — M86172 Other acute osteomyelitis, left ankle and foot: Secondary | ICD-10-CM | POA: Diagnosis not present

## 2022-12-02 DIAGNOSIS — N179 Acute kidney failure, unspecified: Secondary | ICD-10-CM | POA: Diagnosis not present

## 2022-12-02 DIAGNOSIS — Z992 Dependence on renal dialysis: Secondary | ICD-10-CM | POA: Diagnosis not present

## 2022-12-04 DIAGNOSIS — N179 Acute kidney failure, unspecified: Secondary | ICD-10-CM | POA: Diagnosis not present

## 2022-12-04 DIAGNOSIS — Z992 Dependence on renal dialysis: Secondary | ICD-10-CM | POA: Diagnosis not present

## 2022-12-04 DIAGNOSIS — N2581 Secondary hyperparathyroidism of renal origin: Secondary | ICD-10-CM | POA: Diagnosis not present

## 2022-12-07 DIAGNOSIS — N179 Acute kidney failure, unspecified: Secondary | ICD-10-CM | POA: Diagnosis not present

## 2022-12-07 DIAGNOSIS — N2581 Secondary hyperparathyroidism of renal origin: Secondary | ICD-10-CM | POA: Diagnosis not present

## 2022-12-07 DIAGNOSIS — Z992 Dependence on renal dialysis: Secondary | ICD-10-CM | POA: Diagnosis not present

## 2022-12-08 DIAGNOSIS — J9621 Acute and chronic respiratory failure with hypoxia: Secondary | ICD-10-CM | POA: Diagnosis not present

## 2022-12-08 DIAGNOSIS — N184 Chronic kidney disease, stage 4 (severe): Secondary | ICD-10-CM | POA: Diagnosis not present

## 2022-12-08 DIAGNOSIS — J440 Chronic obstructive pulmonary disease with acute lower respiratory infection: Secondary | ICD-10-CM | POA: Diagnosis not present

## 2022-12-08 DIAGNOSIS — E1122 Type 2 diabetes mellitus with diabetic chronic kidney disease: Secondary | ICD-10-CM | POA: Diagnosis not present

## 2022-12-08 DIAGNOSIS — D631 Anemia in chronic kidney disease: Secondary | ICD-10-CM | POA: Diagnosis not present

## 2022-12-08 DIAGNOSIS — I130 Hypertensive heart and chronic kidney disease with heart failure and stage 1 through stage 4 chronic kidney disease, or unspecified chronic kidney disease: Secondary | ICD-10-CM | POA: Diagnosis not present

## 2022-12-08 DIAGNOSIS — J189 Pneumonia, unspecified organism: Secondary | ICD-10-CM | POA: Diagnosis not present

## 2022-12-08 DIAGNOSIS — A419 Sepsis, unspecified organism: Secondary | ICD-10-CM | POA: Diagnosis not present

## 2022-12-08 DIAGNOSIS — I5033 Acute on chronic diastolic (congestive) heart failure: Secondary | ICD-10-CM | POA: Diagnosis not present

## 2022-12-09 DIAGNOSIS — N179 Acute kidney failure, unspecified: Secondary | ICD-10-CM | POA: Diagnosis not present

## 2022-12-09 DIAGNOSIS — N2581 Secondary hyperparathyroidism of renal origin: Secondary | ICD-10-CM | POA: Diagnosis not present

## 2022-12-09 DIAGNOSIS — Z992 Dependence on renal dialysis: Secondary | ICD-10-CM | POA: Diagnosis not present

## 2022-12-11 DIAGNOSIS — Z992 Dependence on renal dialysis: Secondary | ICD-10-CM | POA: Diagnosis not present

## 2022-12-11 DIAGNOSIS — N2581 Secondary hyperparathyroidism of renal origin: Secondary | ICD-10-CM | POA: Diagnosis not present

## 2022-12-11 DIAGNOSIS — N179 Acute kidney failure, unspecified: Secondary | ICD-10-CM | POA: Diagnosis not present

## 2022-12-14 DIAGNOSIS — Z992 Dependence on renal dialysis: Secondary | ICD-10-CM | POA: Diagnosis not present

## 2022-12-14 DIAGNOSIS — N179 Acute kidney failure, unspecified: Secondary | ICD-10-CM | POA: Diagnosis not present

## 2022-12-14 DIAGNOSIS — N2581 Secondary hyperparathyroidism of renal origin: Secondary | ICD-10-CM | POA: Diagnosis not present

## 2022-12-15 ENCOUNTER — Other Ambulatory Visit: Payer: Self-pay

## 2022-12-15 ENCOUNTER — Ambulatory Visit (INDEPENDENT_AMBULATORY_CARE_PROVIDER_SITE_OTHER): Payer: Medicare HMO | Admitting: Internal Medicine

## 2022-12-15 ENCOUNTER — Encounter: Payer: Self-pay | Admitting: Internal Medicine

## 2022-12-15 VITALS — BP 124/51 | HR 70 | Temp 98.3°F | Ht 65.0 in | Wt 190.0 lb

## 2022-12-15 DIAGNOSIS — J9621 Acute and chronic respiratory failure with hypoxia: Secondary | ICD-10-CM | POA: Diagnosis not present

## 2022-12-15 DIAGNOSIS — I130 Hypertensive heart and chronic kidney disease with heart failure and stage 1 through stage 4 chronic kidney disease, or unspecified chronic kidney disease: Secondary | ICD-10-CM | POA: Diagnosis not present

## 2022-12-15 DIAGNOSIS — B9562 Methicillin resistant Staphylococcus aureus infection as the cause of diseases classified elsewhere: Secondary | ICD-10-CM | POA: Diagnosis not present

## 2022-12-15 DIAGNOSIS — E1122 Type 2 diabetes mellitus with diabetic chronic kidney disease: Secondary | ICD-10-CM | POA: Diagnosis not present

## 2022-12-15 DIAGNOSIS — R7881 Bacteremia: Secondary | ICD-10-CM

## 2022-12-15 DIAGNOSIS — N184 Chronic kidney disease, stage 4 (severe): Secondary | ICD-10-CM | POA: Diagnosis not present

## 2022-12-15 DIAGNOSIS — A419 Sepsis, unspecified organism: Secondary | ICD-10-CM | POA: Diagnosis not present

## 2022-12-15 DIAGNOSIS — D631 Anemia in chronic kidney disease: Secondary | ICD-10-CM | POA: Diagnosis not present

## 2022-12-15 DIAGNOSIS — J440 Chronic obstructive pulmonary disease with acute lower respiratory infection: Secondary | ICD-10-CM | POA: Diagnosis not present

## 2022-12-15 DIAGNOSIS — I5033 Acute on chronic diastolic (congestive) heart failure: Secondary | ICD-10-CM | POA: Diagnosis not present

## 2022-12-15 DIAGNOSIS — J189 Pneumonia, unspecified organism: Secondary | ICD-10-CM | POA: Diagnosis not present

## 2022-12-15 NOTE — Progress Notes (Signed)
   Subjective:    Patient ID: Kayla [name], female    DOB: 10-01-1940, 82 y.o.   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  She completed 6 weeks of IV daptomycin on 6/18 and here for follow up.  Please see history from previous office visit.      Review of Systems  Constitutional:  Negative for chills, fatigue and fever.  Gastrointestinal:  Negative for diarrhea.  Skin:  Negative for rash.       Objective:   Physical Exam Eyes:     General: No scleral icterus. Pulmonary:     Effort: Pulmonary effort is normal.  Neurological:     Mental Status: She is alert.   SH: no tobacco        Assessment & Plan:

## 2022-12-15 NOTE — Assessment & Plan Note (Addendum)
Hospitalization reviewed.  She is doing well now with no concerns regarding her previous infection.  No new issues.  Asking about her blood pressures.  Clinically with no cocerns for ongoing infection with no symptoms.  No further testing or treatment indicated at this time.  She will follow up with cardiology.  Can follow up here as needed.   I have personally spent 32 minutes involved in face-to-face and non-face-to-face activities for this patient on the day of the visit. Professional time spent includes the following activities: Preparing to see the patient (review of tests), Obtaining and/or reviewing separately obtained history (admission/discharge record), Performing a medically appropriate examination and/or evaluation , Ordering medications/tests/procedures, referring and communicating with other health care professionals, Documenting clinical information in the EMR, Independently interpreting results (not separately reported), Communicating results to the patient/family/caregiver, Counseling and educating the patient/family/caregiver and Care coordination (not separately reported).

## 2022-12-16 DIAGNOSIS — N179 Acute kidney failure, unspecified: Secondary | ICD-10-CM | POA: Diagnosis not present

## 2022-12-16 DIAGNOSIS — Z992 Dependence on renal dialysis: Secondary | ICD-10-CM | POA: Diagnosis not present

## 2022-12-16 DIAGNOSIS — N2581 Secondary hyperparathyroidism of renal origin: Secondary | ICD-10-CM | POA: Diagnosis not present

## 2022-12-18 DIAGNOSIS — N2581 Secondary hyperparathyroidism of renal origin: Secondary | ICD-10-CM | POA: Diagnosis not present

## 2022-12-18 DIAGNOSIS — Z992 Dependence on renal dialysis: Secondary | ICD-10-CM | POA: Diagnosis not present

## 2022-12-18 DIAGNOSIS — N179 Acute kidney failure, unspecified: Secondary | ICD-10-CM | POA: Diagnosis not present

## 2022-12-21 DIAGNOSIS — N179 Acute kidney failure, unspecified: Secondary | ICD-10-CM | POA: Diagnosis not present

## 2022-12-21 DIAGNOSIS — N2581 Secondary hyperparathyroidism of renal origin: Secondary | ICD-10-CM | POA: Diagnosis not present

## 2022-12-21 DIAGNOSIS — Z992 Dependence on renal dialysis: Secondary | ICD-10-CM | POA: Diagnosis not present

## 2022-12-22 DIAGNOSIS — F419 Anxiety disorder, unspecified: Secondary | ICD-10-CM | POA: Diagnosis not present

## 2022-12-22 DIAGNOSIS — D631 Anemia in chronic kidney disease: Secondary | ICD-10-CM | POA: Diagnosis not present

## 2022-12-22 DIAGNOSIS — I130 Hypertensive heart and chronic kidney disease with heart failure and stage 1 through stage 4 chronic kidney disease, or unspecified chronic kidney disease: Secondary | ICD-10-CM | POA: Diagnosis not present

## 2022-12-22 DIAGNOSIS — R110 Nausea: Secondary | ICD-10-CM | POA: Diagnosis not present

## 2022-12-22 DIAGNOSIS — J189 Pneumonia, unspecified organism: Secondary | ICD-10-CM | POA: Diagnosis not present

## 2022-12-22 DIAGNOSIS — I5033 Acute on chronic diastolic (congestive) heart failure: Secondary | ICD-10-CM | POA: Diagnosis not present

## 2022-12-22 DIAGNOSIS — J9621 Acute and chronic respiratory failure with hypoxia: Secondary | ICD-10-CM | POA: Diagnosis not present

## 2022-12-22 DIAGNOSIS — A419 Sepsis, unspecified organism: Secondary | ICD-10-CM | POA: Diagnosis not present

## 2022-12-22 DIAGNOSIS — E1122 Type 2 diabetes mellitus with diabetic chronic kidney disease: Secondary | ICD-10-CM | POA: Diagnosis not present

## 2022-12-22 DIAGNOSIS — J440 Chronic obstructive pulmonary disease with acute lower respiratory infection: Secondary | ICD-10-CM | POA: Diagnosis not present

## 2022-12-22 DIAGNOSIS — R0781 Pleurodynia: Secondary | ICD-10-CM | POA: Diagnosis not present

## 2022-12-22 DIAGNOSIS — N184 Chronic kidney disease, stage 4 (severe): Secondary | ICD-10-CM | POA: Diagnosis not present

## 2022-12-23 DIAGNOSIS — Z992 Dependence on renal dialysis: Secondary | ICD-10-CM | POA: Diagnosis not present

## 2022-12-23 DIAGNOSIS — N2581 Secondary hyperparathyroidism of renal origin: Secondary | ICD-10-CM | POA: Diagnosis not present

## 2022-12-23 DIAGNOSIS — N179 Acute kidney failure, unspecified: Secondary | ICD-10-CM | POA: Diagnosis not present

## 2022-12-25 DIAGNOSIS — N2581 Secondary hyperparathyroidism of renal origin: Secondary | ICD-10-CM | POA: Diagnosis not present

## 2022-12-25 DIAGNOSIS — Z992 Dependence on renal dialysis: Secondary | ICD-10-CM | POA: Diagnosis not present

## 2022-12-25 DIAGNOSIS — N179 Acute kidney failure, unspecified: Secondary | ICD-10-CM | POA: Diagnosis not present

## 2022-12-28 DIAGNOSIS — N179 Acute kidney failure, unspecified: Secondary | ICD-10-CM | POA: Diagnosis not present

## 2022-12-28 DIAGNOSIS — Z992 Dependence on renal dialysis: Secondary | ICD-10-CM | POA: Diagnosis not present

## 2022-12-28 DIAGNOSIS — N2581 Secondary hyperparathyroidism of renal origin: Secondary | ICD-10-CM | POA: Diagnosis not present

## 2022-12-29 DIAGNOSIS — J189 Pneumonia, unspecified organism: Secondary | ICD-10-CM | POA: Diagnosis not present

## 2022-12-29 DIAGNOSIS — J9621 Acute and chronic respiratory failure with hypoxia: Secondary | ICD-10-CM | POA: Diagnosis not present

## 2022-12-29 DIAGNOSIS — N184 Chronic kidney disease, stage 4 (severe): Secondary | ICD-10-CM | POA: Diagnosis not present

## 2022-12-29 DIAGNOSIS — J440 Chronic obstructive pulmonary disease with acute lower respiratory infection: Secondary | ICD-10-CM | POA: Diagnosis not present

## 2022-12-29 DIAGNOSIS — D631 Anemia in chronic kidney disease: Secondary | ICD-10-CM | POA: Diagnosis not present

## 2022-12-29 DIAGNOSIS — E1122 Type 2 diabetes mellitus with diabetic chronic kidney disease: Secondary | ICD-10-CM | POA: Diagnosis not present

## 2022-12-29 DIAGNOSIS — I130 Hypertensive heart and chronic kidney disease with heart failure and stage 1 through stage 4 chronic kidney disease, or unspecified chronic kidney disease: Secondary | ICD-10-CM | POA: Diagnosis not present

## 2022-12-29 DIAGNOSIS — A419 Sepsis, unspecified organism: Secondary | ICD-10-CM | POA: Diagnosis not present

## 2022-12-29 DIAGNOSIS — I5033 Acute on chronic diastolic (congestive) heart failure: Secondary | ICD-10-CM | POA: Diagnosis not present

## 2022-12-30 DIAGNOSIS — N179 Acute kidney failure, unspecified: Secondary | ICD-10-CM | POA: Diagnosis not present

## 2022-12-30 DIAGNOSIS — Z992 Dependence on renal dialysis: Secondary | ICD-10-CM | POA: Diagnosis not present

## 2022-12-30 DIAGNOSIS — N2581 Secondary hyperparathyroidism of renal origin: Secondary | ICD-10-CM | POA: Diagnosis not present

## 2023-01-01 DIAGNOSIS — N2581 Secondary hyperparathyroidism of renal origin: Secondary | ICD-10-CM | POA: Diagnosis not present

## 2023-01-01 DIAGNOSIS — N179 Acute kidney failure, unspecified: Secondary | ICD-10-CM | POA: Diagnosis not present

## 2023-01-01 DIAGNOSIS — Z992 Dependence on renal dialysis: Secondary | ICD-10-CM | POA: Diagnosis not present

## 2023-01-01 NOTE — Progress Notes (Unsigned)
  Electrophysiology Office Note:   ID:  Shardee, Brizendine [birth year], MRN [id number]  Primary Cardiologist: Donato Schultz, MD Electrophysiologist: Sherryl Manges, MD  {Click to update primary MD,subspecialty MD or APP then REFRESH:1}    History of Present Illness:   Kayla [name] is a 82 y.o. 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   Previously admitted from 5/2-5/14 for CEID in the setting of MRSA bacteremia, osteomyelitis of her left foot. TEE was completed which showed thickening on the RV lead in the RA. Her PPM was extracted on 5/7 & temp perm placed. Her beta blockers were held. She had no further evidence of chrontropic incompetence/SSS at that time that warranted re-implantation. She had a ZIO patch mailed to her that was mailed back on 5/28. Beta blockers were held but she was on amiodarone.  She was discharged to SNF for rehab with plan for 6 weeks of IV daptomycin (started 09/08/22).      She was referred to the ER on 5/29 for clotted PICC line. At that time, she was noted to have sinus bradycardia 40-50's. After questioning, she had complaints of dizziness/lightheadedness with PT efforts while at the SNF. She was hemodynamically stable. She was monitored in hospital with no acute events and stable junctional rhythm.   Finished ABx *** and PICC line out.   Since last being seen in our clinic the patient reports doing ***.  she denies chest pain, palpitations, dyspnea, PND, orthopnea, nausea, vomiting, dizziness, syncope, edema, weight gain, or early satiety.   Review of systems complete and found to be negative unless listed in HPI.      EP Information / Studies Reviewed:    EKG is ordered today. Personal review as below.       PPM Interrogation-   reviewed in detail today,  See PACEART report.  DEVICE HISTORY:  CEID s/p Extraction on 5/7. No device currently with active MRSA bacteremia.   Physical Exam:   VS:  There were no vitals taken for this visit.   Wt Readings from Last 3 Encounters:  12/15/22 190 lb (86.2 kg)  11/14/22 198 lb 10.2 oz (90.1 kg)  10/06/22 223 lb 9.6 oz (101.4 kg)     GEN: Well nourished, well developed in no acute distress NECK: No JVD; No carotid bruits CARDIAC: {EPRHYTHM:28826}, no murmurs, rubs, gallops RESPIRATORY:  Clear to auscultation without rales, wheezing or rhonchi  ABDOMEN: Soft, non-tender, non-distended EXTREMITIES:  No edema; No deformity   ASSESSMENT AND PLAN:    Bradycardia / Junctional Rhythm  SSS Medtronic PPM removed 5/7 secondary to CEID.  EKG today shows ***  Avoid beta blockers -no indication for PPM at this time    Chronic Diastolic CHF  Volume status *** Continue torsemide.  Watch fluid and sodium intake.  Follows with neuro.  -pt intolerant of LE compression stockings    PAF EKG today shows *** Continue eliquis 2.5mg  BID for CHA2DS2/VASc of at least 7.    {Click here to Review PMH, Prob List, Meds, Allergies, SHx, FHx  :1}   Disposition:   Follow up with {EPPROVIDERS:28135} {EPFOLLOW UP:28173}  Signed, Kayla Freer, PA-C

## 2023-01-04 DIAGNOSIS — N179 Acute kidney failure, unspecified: Secondary | ICD-10-CM | POA: Diagnosis not present

## 2023-01-04 DIAGNOSIS — Z992 Dependence on renal dialysis: Secondary | ICD-10-CM | POA: Diagnosis not present

## 2023-01-04 DIAGNOSIS — N2581 Secondary hyperparathyroidism of renal origin: Secondary | ICD-10-CM | POA: Diagnosis not present

## 2023-01-05 ENCOUNTER — Ambulatory Visit: Payer: Medicare HMO | Attending: Student | Admitting: Student

## 2023-01-05 ENCOUNTER — Encounter: Payer: Self-pay | Admitting: Student

## 2023-01-05 VITALS — BP 134/46 | HR 76 | Ht 65.0 in | Wt 194.0 lb

## 2023-01-05 DIAGNOSIS — I5032 Chronic diastolic (congestive) heart failure: Secondary | ICD-10-CM | POA: Diagnosis not present

## 2023-01-05 DIAGNOSIS — I480 Paroxysmal atrial fibrillation: Secondary | ICD-10-CM

## 2023-01-05 DIAGNOSIS — R001 Bradycardia, unspecified: Secondary | ICD-10-CM | POA: Diagnosis not present

## 2023-01-05 DIAGNOSIS — I495 Sick sinus syndrome: Secondary | ICD-10-CM | POA: Diagnosis not present

## 2023-01-05 NOTE — Patient Instructions (Signed)
Medication Instructions:  Your physician recommends that you continue on your current medications as directed. Please refer to the Current Medication list given to you today.  *If you need a refill on your cardiac medications before your next appointment, please call your pharmacy*  Lab Work: None If you have labs (blood work) drawn today and your tests are completely normal, you will receive your results only by: MyChart Message (if you have MyChart) OR A paper copy in the mail If you have any lab test that is abnormal or we need to change your treatment, we will call you to review the results.  Follow-Up: At [facility], you and your health needs are our priority.  As part of our continuing mission to provide you with exceptional heart care, we have created designated Provider Care Teams.  These Care Teams include your primary Cardiologist (physician) and Advanced Practice Providers (APPs -  Physician Assistants and Nurse Practitioners) who all work together to provide you with the care you need, when you need it.  We recommend signing up for the patient portal called "MyChart".  Sign up information is provided on this After Visit Summary.  MyChart is used to connect with patients for Virtual Visits (Telemedicine).  Patients are able to view lab/test results, encounter notes, upcoming appointments, etc.  Non-urgent messages can be sent to your provider as well.   To learn more about what you can do with MyChart, go to [email or web address].    Your next appointment:   6 month(s)  Provider:   Sherryl Manges, MD

## 2023-01-06 DIAGNOSIS — N2581 Secondary hyperparathyroidism of renal origin: Secondary | ICD-10-CM | POA: Diagnosis not present

## 2023-01-06 DIAGNOSIS — N179 Acute kidney failure, unspecified: Secondary | ICD-10-CM | POA: Diagnosis not present

## 2023-01-06 DIAGNOSIS — Z992 Dependence on renal dialysis: Secondary | ICD-10-CM | POA: Diagnosis not present

## 2023-01-07 ENCOUNTER — Telehealth: Payer: Self-pay | Admitting: Pulmonary Disease

## 2023-01-07 NOTE — Telephone Encounter (Signed)
Patient states that the message said for her to call and make appointment regarding her CPAP machine. She has an appt in October. If that is ok she doesn't need anything further. If needed sooner we are to call her back.

## 2023-01-07 NOTE — Telephone Encounter (Signed)
Pt called in bc she say someone called in her about her CPAP machine and I see no note there

## 2023-01-08 DIAGNOSIS — N179 Acute kidney failure, unspecified: Secondary | ICD-10-CM | POA: Diagnosis not present

## 2023-01-08 DIAGNOSIS — N2581 Secondary hyperparathyroidism of renal origin: Secondary | ICD-10-CM | POA: Diagnosis not present

## 2023-01-08 DIAGNOSIS — Z992 Dependence on renal dialysis: Secondary | ICD-10-CM | POA: Diagnosis not present

## 2023-01-11 DIAGNOSIS — M1710 Unilateral primary osteoarthritis, unspecified knee: Secondary | ICD-10-CM | POA: Diagnosis not present

## 2023-01-11 DIAGNOSIS — E349 Endocrine disorder, unspecified: Secondary | ICD-10-CM | POA: Diagnosis not present

## 2023-01-11 DIAGNOSIS — N179 Acute kidney failure, unspecified: Secondary | ICD-10-CM | POA: Diagnosis not present

## 2023-01-11 DIAGNOSIS — Z992 Dependence on renal dialysis: Secondary | ICD-10-CM | POA: Diagnosis not present

## 2023-01-11 DIAGNOSIS — N2581 Secondary hyperparathyroidism of renal origin: Secondary | ICD-10-CM | POA: Diagnosis not present

## 2023-01-11 DIAGNOSIS — M549 Dorsalgia, unspecified: Secondary | ICD-10-CM | POA: Diagnosis not present

## 2023-01-11 DIAGNOSIS — M353 Polymyalgia rheumatica: Secondary | ICD-10-CM | POA: Diagnosis not present

## 2023-01-11 DIAGNOSIS — M25569 Pain in unspecified knee: Secondary | ICD-10-CM | POA: Diagnosis not present

## 2023-01-11 DIAGNOSIS — Z1389 Encounter for screening for other disorder: Secondary | ICD-10-CM | POA: Diagnosis not present

## 2023-01-11 DIAGNOSIS — R2681 Unsteadiness on feet: Secondary | ICD-10-CM | POA: Diagnosis not present

## 2023-01-11 DIAGNOSIS — M8580 Other specified disorders of bone density and structure, unspecified site: Secondary | ICD-10-CM | POA: Diagnosis not present

## 2023-01-11 DIAGNOSIS — Z79891 Long term (current) use of opiate analgesic: Secondary | ICD-10-CM | POA: Diagnosis not present

## 2023-01-11 NOTE — Telephone Encounter (Signed)
Yes ok for October - was seeing Dr. Aldean Ast - is there a reason she is seeing me? Happy to help but if a sleep issue Dr. Val Eagle may be better suited. Thanks!

## 2023-01-13 NOTE — Telephone Encounter (Signed)
Patient was ok with October appt as long as you were.  Nothing further needed at this time.

## 2023-01-13 NOTE — Telephone Encounter (Signed)
Patient had family members who saw MH. She didn't understand he wasn't a sleep provider. I explained that AO was a sleep specialist. Patient was ok to see AO and we cancelled appt with MH. Pt also informed that the 02 feed line for cpap broke last night and she was not able to use cpap She wlll call DME this morning.

## 2023-01-14 ENCOUNTER — Telehealth: Payer: Self-pay | Admitting: Pharmacy Technician

## 2023-01-14 DIAGNOSIS — Z5986 Financial insecurity: Secondary | ICD-10-CM

## 2023-01-14 NOTE — Progress Notes (Signed)
[facility] Customer service manager [facility])                                            [facility] Team    01/14/2023  Kayla [name] 07-17-1940 [id number]                                      Medication Assistance Referral  Referral From:  [facility]  Kayla [name]  Medication/Company: Kayla [name] / [place] Patient application portion:  Mailed Provider application portion: Faxed  to Dr. Burnell [name] Provider address/fax verified via: Office website   Kayla [name], Kayla [name] [place]  Office: [phone] Fax: [phone] Email: [name]

## 2023-01-15 DIAGNOSIS — Z992 Dependence on renal dialysis: Secondary | ICD-10-CM | POA: Diagnosis not present

## 2023-01-15 DIAGNOSIS — N179 Acute kidney failure, unspecified: Secondary | ICD-10-CM | POA: Diagnosis not present

## 2023-01-15 DIAGNOSIS — N2581 Secondary hyperparathyroidism of renal origin: Secondary | ICD-10-CM | POA: Diagnosis not present

## 2023-01-16 DIAGNOSIS — R051 Acute cough: Secondary | ICD-10-CM | POA: Diagnosis not present

## 2023-01-16 DIAGNOSIS — R0981 Nasal congestion: Secondary | ICD-10-CM | POA: Diagnosis not present

## 2023-01-16 DIAGNOSIS — R509 Fever, unspecified: Secondary | ICD-10-CM | POA: Diagnosis not present

## 2023-01-18 DIAGNOSIS — N2581 Secondary hyperparathyroidism of renal origin: Secondary | ICD-10-CM | POA: Diagnosis not present

## 2023-01-18 DIAGNOSIS — N179 Acute kidney failure, unspecified: Secondary | ICD-10-CM | POA: Diagnosis not present

## 2023-01-18 DIAGNOSIS — Z992 Dependence on renal dialysis: Secondary | ICD-10-CM | POA: Diagnosis not present

## 2023-01-19 DIAGNOSIS — D631 Anemia in chronic kidney disease: Secondary | ICD-10-CM | POA: Diagnosis not present

## 2023-01-19 DIAGNOSIS — J440 Chronic obstructive pulmonary disease with acute lower respiratory infection: Secondary | ICD-10-CM | POA: Diagnosis not present

## 2023-01-19 DIAGNOSIS — J9621 Acute and chronic respiratory failure with hypoxia: Secondary | ICD-10-CM | POA: Diagnosis not present

## 2023-01-19 DIAGNOSIS — I5033 Acute on chronic diastolic (congestive) heart failure: Secondary | ICD-10-CM | POA: Diagnosis not present

## 2023-01-19 DIAGNOSIS — E1122 Type 2 diabetes mellitus with diabetic chronic kidney disease: Secondary | ICD-10-CM | POA: Diagnosis not present

## 2023-01-19 DIAGNOSIS — A419 Sepsis, unspecified organism: Secondary | ICD-10-CM | POA: Diagnosis not present

## 2023-01-19 DIAGNOSIS — I130 Hypertensive heart and chronic kidney disease with heart failure and stage 1 through stage 4 chronic kidney disease, or unspecified chronic kidney disease: Secondary | ICD-10-CM | POA: Diagnosis not present

## 2023-01-19 DIAGNOSIS — J189 Pneumonia, unspecified organism: Secondary | ICD-10-CM | POA: Diagnosis not present

## 2023-01-19 DIAGNOSIS — N184 Chronic kidney disease, stage 4 (severe): Secondary | ICD-10-CM | POA: Diagnosis not present

## 2023-01-22 DIAGNOSIS — N2581 Secondary hyperparathyroidism of renal origin: Secondary | ICD-10-CM | POA: Diagnosis not present

## 2023-01-22 DIAGNOSIS — N179 Acute kidney failure, unspecified: Secondary | ICD-10-CM | POA: Diagnosis not present

## 2023-01-22 DIAGNOSIS — Z992 Dependence on renal dialysis: Secondary | ICD-10-CM | POA: Diagnosis not present

## 2023-01-25 DIAGNOSIS — N179 Acute kidney failure, unspecified: Secondary | ICD-10-CM | POA: Diagnosis not present

## 2023-01-25 DIAGNOSIS — Z992 Dependence on renal dialysis: Secondary | ICD-10-CM | POA: Diagnosis not present

## 2023-01-25 DIAGNOSIS — N2581 Secondary hyperparathyroidism of renal origin: Secondary | ICD-10-CM | POA: Diagnosis not present

## 2023-01-26 DIAGNOSIS — E1122 Type 2 diabetes mellitus with diabetic chronic kidney disease: Secondary | ICD-10-CM | POA: Diagnosis not present

## 2023-01-26 DIAGNOSIS — J9621 Acute and chronic respiratory failure with hypoxia: Secondary | ICD-10-CM | POA: Diagnosis not present

## 2023-01-26 DIAGNOSIS — J440 Chronic obstructive pulmonary disease with acute lower respiratory infection: Secondary | ICD-10-CM | POA: Diagnosis not present

## 2023-01-26 DIAGNOSIS — D631 Anemia in chronic kidney disease: Secondary | ICD-10-CM | POA: Diagnosis not present

## 2023-01-26 DIAGNOSIS — I5033 Acute on chronic diastolic (congestive) heart failure: Secondary | ICD-10-CM | POA: Diagnosis not present

## 2023-01-26 DIAGNOSIS — J189 Pneumonia, unspecified organism: Secondary | ICD-10-CM | POA: Diagnosis not present

## 2023-01-26 DIAGNOSIS — N184 Chronic kidney disease, stage 4 (severe): Secondary | ICD-10-CM | POA: Diagnosis not present

## 2023-01-26 DIAGNOSIS — I130 Hypertensive heart and chronic kidney disease with heart failure and stage 1 through stage 4 chronic kidney disease, or unspecified chronic kidney disease: Secondary | ICD-10-CM | POA: Diagnosis not present

## 2023-01-26 DIAGNOSIS — A419 Sepsis, unspecified organism: Secondary | ICD-10-CM | POA: Diagnosis not present

## 2023-01-28 DIAGNOSIS — J440 Chronic obstructive pulmonary disease with acute lower respiratory infection: Secondary | ICD-10-CM | POA: Diagnosis not present

## 2023-01-28 DIAGNOSIS — I130 Hypertensive heart and chronic kidney disease with heart failure and stage 1 through stage 4 chronic kidney disease, or unspecified chronic kidney disease: Secondary | ICD-10-CM | POA: Diagnosis not present

## 2023-01-28 DIAGNOSIS — A419 Sepsis, unspecified organism: Secondary | ICD-10-CM | POA: Diagnosis not present

## 2023-01-28 DIAGNOSIS — D631 Anemia in chronic kidney disease: Secondary | ICD-10-CM | POA: Diagnosis not present

## 2023-01-28 DIAGNOSIS — E1122 Type 2 diabetes mellitus with diabetic chronic kidney disease: Secondary | ICD-10-CM | POA: Diagnosis not present

## 2023-01-28 DIAGNOSIS — N184 Chronic kidney disease, stage 4 (severe): Secondary | ICD-10-CM | POA: Diagnosis not present

## 2023-01-28 DIAGNOSIS — J9621 Acute and chronic respiratory failure with hypoxia: Secondary | ICD-10-CM | POA: Diagnosis not present

## 2023-01-28 DIAGNOSIS — J189 Pneumonia, unspecified organism: Secondary | ICD-10-CM | POA: Diagnosis not present

## 2023-01-28 DIAGNOSIS — I5033 Acute on chronic diastolic (congestive) heart failure: Secondary | ICD-10-CM | POA: Diagnosis not present

## 2023-01-29 DIAGNOSIS — N2581 Secondary hyperparathyroidism of renal origin: Secondary | ICD-10-CM | POA: Diagnosis not present

## 2023-01-29 DIAGNOSIS — N186 End stage renal disease: Secondary | ICD-10-CM | POA: Diagnosis not present

## 2023-01-29 DIAGNOSIS — Z992 Dependence on renal dialysis: Secondary | ICD-10-CM | POA: Diagnosis not present

## 2023-02-01 DIAGNOSIS — Z992 Dependence on renal dialysis: Secondary | ICD-10-CM | POA: Diagnosis not present

## 2023-02-01 DIAGNOSIS — N186 End stage renal disease: Secondary | ICD-10-CM | POA: Diagnosis not present

## 2023-02-01 DIAGNOSIS — N2581 Secondary hyperparathyroidism of renal origin: Secondary | ICD-10-CM | POA: Diagnosis not present

## 2023-02-05 DIAGNOSIS — N186 End stage renal disease: Secondary | ICD-10-CM | POA: Diagnosis not present

## 2023-02-05 DIAGNOSIS — N2581 Secondary hyperparathyroidism of renal origin: Secondary | ICD-10-CM | POA: Diagnosis not present

## 2023-02-05 DIAGNOSIS — Z992 Dependence on renal dialysis: Secondary | ICD-10-CM | POA: Diagnosis not present

## 2023-02-08 ENCOUNTER — Ambulatory Visit: Payer: Medicare HMO | Admitting: Pulmonary Disease

## 2023-02-08 DIAGNOSIS — N186 End stage renal disease: Secondary | ICD-10-CM | POA: Diagnosis not present

## 2023-02-08 DIAGNOSIS — N2581 Secondary hyperparathyroidism of renal origin: Secondary | ICD-10-CM | POA: Diagnosis not present

## 2023-02-08 DIAGNOSIS — Z992 Dependence on renal dialysis: Secondary | ICD-10-CM | POA: Diagnosis not present

## 2023-02-09 DIAGNOSIS — E349 Endocrine disorder, unspecified: Secondary | ICD-10-CM | POA: Diagnosis not present

## 2023-02-09 DIAGNOSIS — M8580 Other specified disorders of bone density and structure, unspecified site: Secondary | ICD-10-CM | POA: Diagnosis not present

## 2023-02-09 DIAGNOSIS — Z1389 Encounter for screening for other disorder: Secondary | ICD-10-CM | POA: Diagnosis not present

## 2023-02-09 DIAGNOSIS — E113213 Type 2 diabetes mellitus with mild nonproliferative diabetic retinopathy with macular edema, bilateral: Secondary | ICD-10-CM | POA: Diagnosis not present

## 2023-02-09 DIAGNOSIS — R2681 Unsteadiness on feet: Secondary | ICD-10-CM | POA: Diagnosis not present

## 2023-02-09 DIAGNOSIS — Z79891 Long term (current) use of opiate analgesic: Secondary | ICD-10-CM | POA: Diagnosis not present

## 2023-02-09 DIAGNOSIS — H353132 Nonexudative age-related macular degeneration, bilateral, intermediate dry stage: Secondary | ICD-10-CM | POA: Diagnosis not present

## 2023-02-09 DIAGNOSIS — H43813 Vitreous degeneration, bilateral: Secondary | ICD-10-CM | POA: Diagnosis not present

## 2023-02-09 DIAGNOSIS — M1710 Unilateral primary osteoarthritis, unspecified knee: Secondary | ICD-10-CM | POA: Diagnosis not present

## 2023-02-09 DIAGNOSIS — H35033 Hypertensive retinopathy, bilateral: Secondary | ICD-10-CM | POA: Diagnosis not present

## 2023-02-09 DIAGNOSIS — Z961 Presence of intraocular lens: Secondary | ICD-10-CM | POA: Diagnosis not present

## 2023-02-09 DIAGNOSIS — M549 Dorsalgia, unspecified: Secondary | ICD-10-CM | POA: Diagnosis not present

## 2023-02-09 DIAGNOSIS — M25569 Pain in unspecified knee: Secondary | ICD-10-CM | POA: Diagnosis not present

## 2023-02-09 DIAGNOSIS — M353 Polymyalgia rheumatica: Secondary | ICD-10-CM | POA: Diagnosis not present

## 2023-02-10 DIAGNOSIS — G894 Chronic pain syndrome: Secondary | ICD-10-CM | POA: Diagnosis not present

## 2023-02-11 ENCOUNTER — Ambulatory Visit: Payer: Medicare HMO | Admitting: Pulmonary Disease

## 2023-02-12 DIAGNOSIS — N2581 Secondary hyperparathyroidism of renal origin: Secondary | ICD-10-CM | POA: Diagnosis not present

## 2023-02-12 DIAGNOSIS — Z992 Dependence on renal dialysis: Secondary | ICD-10-CM | POA: Diagnosis not present

## 2023-02-12 DIAGNOSIS — N186 End stage renal disease: Secondary | ICD-10-CM | POA: Diagnosis not present

## 2023-02-15 DIAGNOSIS — Z992 Dependence on renal dialysis: Secondary | ICD-10-CM | POA: Diagnosis not present

## 2023-02-15 DIAGNOSIS — N2581 Secondary hyperparathyroidism of renal origin: Secondary | ICD-10-CM | POA: Diagnosis not present

## 2023-02-15 DIAGNOSIS — N186 End stage renal disease: Secondary | ICD-10-CM | POA: Diagnosis not present

## 2023-02-16 ENCOUNTER — Ambulatory Visit (INDEPENDENT_AMBULATORY_CARE_PROVIDER_SITE_OTHER): Payer: Medicare HMO | Admitting: Pulmonary Disease

## 2023-02-16 ENCOUNTER — Encounter: Payer: Self-pay | Admitting: Pulmonary Disease

## 2023-02-16 VITALS — BP 102/60 | HR 72 | Ht 65.0 in | Wt 190.8 lb

## 2023-02-16 DIAGNOSIS — J449 Chronic obstructive pulmonary disease, unspecified: Secondary | ICD-10-CM | POA: Diagnosis not present

## 2023-02-16 DIAGNOSIS — G4733 Obstructive sleep apnea (adult) (pediatric): Secondary | ICD-10-CM

## 2023-02-16 MED ORDER — PREDNISONE 20 MG PO TABS: 20.0000 mg | ORAL_TABLET | Freq: Every day | ORAL | 0 refills | Status: DC

## 2023-02-16 MED ORDER — AZITHROMYCIN 250 MG PO TABS: ORAL_TABLET | ORAL | 0 refills | Status: DC

## 2023-02-16 NOTE — Progress Notes (Unsigned)
Kayla [name]    [id number]    November 08, 1940  Primary Care Physician:Hamrick, Durward Fortes, MD  Referring Physician: Ailene Ravel, MD [address] [employer],  [place] [address]  Chief complaint:   Patient with a history of chronic obstructive pulmonary disease, obstructive sleep apnea  HPI:  She does have cough and congestion for the last couple of weeks No fevers, no chills Grayish phlegm  Has been using CPAP regularly and tolerating it well Wakes up feeling like she is at a good nights rest  History of COPD, on Breztri compliant with Breztri use On oxygen supplementation and compliant with use  Patient has a lot of back pain and hip pain which limits ability to ambulate She has had a few falls recently  No recent hospitalization  She ambulates with a walker Has balance issues, chronic pain  She does follow with pain management  Reformed smoker, history of chronic respiratory failure, insulin-dependent diabetes, atrial fibrillation, diastolic heart failure, chronic kidney disease  Last PSG was October 2006-AHI of 10 Was titrated to CPAP of 11  Did not tolerate Trelegy, Incruse, Spiriva in the past, breathing has been best with Markus Daft On 3 L of oxygen chronically  Outpatient Encounter Medications as of 02/16/2023  Medication Sig   acetaminophen (TYLENOL) 500 MG tablet Take 1,000-1,500 mg by mouth every 8 (eight) hours as needed for mild pain, moderate pain or headache.   albuterol (PROVENTIL) (2.5 MG/3ML) 0.083% nebulizer solution Take 3 mLs (2.5 mg total) by nebulization every 2 (two) hours as needed for wheezing or shortness of breath.   albuterol (VENTOLIN HFA) 108 (90 Base) MCG/ACT inhaler Inhale 2 puffs into the lungs every 6 (six) hours as needed for wheezing or shortness of breath.   Alcohol Swabs (B-D SINGLE USE SWABS REGULAR) PADS    allopurinol (ZYLOPRIM) 100 MG tablet Take 100 mg by mouth 2 (two) times daily.   amLODipine (NORVASC) 5 MG tablet  Take 2 tablets (10 mg total) by mouth daily. (Patient taking differently: Take 5 mg by mouth daily.)   ascorbic acid (VITAMIN C) 500 MG tablet Take 500 mg by mouth daily with lunch.   atorvastatin (LIPITOR) 40 MG tablet Take 1 tablet (40 mg total) by mouth daily. (Patient taking differently: Take 40 mg by mouth at bedtime.)   Blood Glucose Calibration (TRUE METRIX LEVEL 1) Low SOLN    Budeson-Glycopyrrol-Formoterol (BREZTRI AEROSPHERE) 160-9-4.8 MCG/ACT AERO Inhale 2 puffs into the lungs in the morning and at bedtime.   Carboxymethylcellulose Sodium (ARTIFICIAL TEARS OP) Place 1 drop into both eyes daily as needed (dry eyes).   Doxercalciferol (HECTOROL IV) Doxercalciferol (Hectorol)   ELIQUIS 2.5 MG TABS tablet TAKE 1 TABLET TWICE DAILY   escitalopram (LEXAPRO) 5 MG tablet Take 1 tablet (5 mg total) by mouth daily.   ferrous sulfate 325 (65 FE) MG tablet Take 1 tablet (325 mg total) by mouth daily with breakfast.   fluticasone (FLONASE) 50 MCG/ACT nasal spray USE 2 SPRAYS NASALLY DAILY (Patient taking differently: Place 1 spray into both nostrils daily as needed for allergies.)   gabapentin (NEURONTIN) 800 MG tablet Take 800 mg by mouth 3 (three) times daily.   guaiFENesin (ROBITUSSIN) 100 MG/5ML liquid Take 5 mLs by mouth every 4 (four) hours as needed for cough or to loosen phlegm.   HYDROcodone-acetaminophen (NORCO) 10-325 MG tablet Take 1 tablet by mouth every 6 (six) hours as needed. (Patient taking differently: Take 1 tablet by  mouth every 6 (six) hours as needed for severe pain (pain score 7-10).)   HYDROXYZINE HCL PO Take 25 mg by mouth at bedtime as needed (insomnia).   insulin aspart (NOVOLOG) 100 UNIT/ML FlexPen Inject 0-20 Units into the skin See admin instructions. Inject 0-20 units into the skin three times a day before meals "as needed," per SLIDING SCALE   isosorbide mononitrate (IMDUR) 60 MG 24 hr tablet Take 1 tablet (60 mg total) by mouth daily.   levothyroxine (SYNTHROID) 200  MCG tablet Take 200 mcg by mouth daily before breakfast.   loratadine (CLARITIN) 10 MG tablet Take 1 tablet (10 mg total) by mouth daily.   melatonin 5 MG TABS Take 1 tablet (5 mg total) by mouth at bedtime as needed.   Methoxy PEG-Epoetin Beta (MIRCERA IJ) Mircera   multivitamin (RENA-VIT) TABS tablet Take 1 tablet by mouth at bedtime.   nitroGLYCERIN (NITROSTAT) 0.4 MG SL tablet Place 1 tablet (0.4 mg total) under the tongue every 5 (five) minutes as needed for chest pain.   polyethylene glycol (MIRALAX / GLYCOLAX) 17 g packet Take 17 g by mouth daily as needed for mild constipation or moderate constipation.   PRESCRIPTION MEDICATION CPAP- At bedtime and as needed during any time of rest   promethazine (PHENERGAN) 25 MG suppository SMARTSIG:0.5-1 SUPPOS Rectally Every 6 Hours PRN   sevelamer carbonate (RENVELA) 800 MG tablet Take 1 tablet (800 mg total) by mouth 3 (three) times daily with meals.   torsemide (DEMADEX) 100 MG tablet Take 1 tablet (100 mg total) by mouth 2 (two) times daily.   TRUE METRIX BLOOD GLUCOSE TEST test strip    TRUEplus Lancets 28G MISC    vitamin B-12 (CYANOCOBALAMIN) 1000 MCG tablet Take 1,000 mcg by mouth daily with lunch.   Vitamin D3 (VITAMIN D) 25 MCG tablet Take 1,000 Units by mouth daily with lunch.   No facility-administered encounter medications on file as of 02/16/2023.    Allergies as of 02/16/2023 - Review Complete 02/16/2023  Allergen Reaction Noted   Dulaglutide Nausea Only, Other (See Comments), and Cough 03/16/2019   Oxycodone Itching and Other (See Comments) 01/30/2021   Trelegy ellipta [fluticasone-umeclidin-vilant] Nausea And Vomiting, Other (See Comments), and Cough 03/18/2021    Past Medical History:  Diagnosis Date   Allergy    Anxiety    Arthritis    CAD (coronary artery disease)    Mild per remote cath in 2006, /    Nuclear, January, 2013, no significant abnormality,   Carotid artery disease (HCC)    Doppler, January, 2013, 0 39%  RIC A., 40-59% LICA, stable   Cataract    CHF (congestive heart failure) (HCC)    Chronic kidney disease    Complication of anesthesia    wakes up "mean"   COPD 02/16/2007   Intolerant of Spiriva, tudorza Intolerant of Trelegy    COPD (chronic obstructive pulmonary disease) (HCC)    Coronary atherosclerosis 11/24/2008   Catheterization 2006, nonobstructive coronary disease   //   nuclear 2013, low risk     Diabetes mellitus, type 2 (HCC)    Diverticula of colon    DIVERTICULOSIS OF COLON 03/23/2007   Qualifier: Diagnosis of  By: Craige Cotta MD, Vineet     Dizziness    occasional   Dizzy spells 05/04/2011   DM (diabetes mellitus) (HCC) 03/23/2007   Qualifier: Diagnosis of  By: Craige Cotta MD, Vineet     Dyspnea    On exertion   Ejection fraction  Essential hypertension 02/16/2007   Qualifier: Diagnosis of  By: Earlene Plater CMA, Tammy     G E R D 03/23/2007   Qualifier: Diagnosis of  By: Craige Cotta MD, Zoila Shutter bladder disease 04/28/2016   Gastritis and gastroduodenitis    GERD (gastroesophageal reflux disease)    Goiter    hx of   Headache(784.0)    migraine   Hyperlipidemia    Hypertension    Hypothyroidism    HYPOTHYROIDISM, POSTSURGICAL 06/04/2008   Qualifier: Diagnosis of  By: Everardo All MD, Sean A    Left ventricular hypertrophy    Leg edema    occasional swelling   Leg swelling 12/09/2018   Myofascial pain syndrome, cervical 01/06/2018   Obesity    OBESITY 11/24/2008   Qualifier: Diagnosis of  By: Vikki Ports     OBSTRUCTIVE SLEEP APNEA 02/16/2007   Auto CPAP 09/03/13 to 10/02/13 >> used on 30 of 30 nights with average 6 hrs and 3 min.  Average AHI is 3.1 with median CPAP 5 cm H2O and 95 th percentile CPAP 6 cm H2O.     Occipital neuralgia of right side 01/06/2018   OSA (obstructive sleep apnea)    cpap setting of 12   Pain in joint of right hip 01/21/2018   Palpitations 01/21/2011   48 hour Holter, 2015, scattered PACs and PVCs.    Paroxysmal atrial fibrillation (HCC)     Pneumonia    PONV (postoperative nausea and vomiting)    severe after every surgery   Pulmonary hypertension, secondary    RHINITIS 07/21/2010        RUQ abdominal pain    S/P left TKA 07/27/2011   Sinus node dysfunction (HCC) 11/08/2015   Sleep apnea    Spinal stenosis of cervical region 11/07/2014   Urinary frequency 12/09/2018   UTI (urinary tract infection) 08/15/2019   per pt report   VENTRICULAR HYPERTROPHY, LEFT 11/24/2008   Qualifier: Diagnosis of  By: Vikki Ports      Past Surgical History:  Procedure Laterality Date   ABDOMINAL HYSTERECTOMY     with appendectomy and colon polypectomy   AMPUTATION TOE Left 12/15/2019   Procedure: AMPUTATION  LEFT GREAT TOE;  Surgeon: Park Liter, DPM;  Location: [facility];  Service: Podiatry;  Laterality: Left;   AMPUTATION TOE Left 01/22/2022   Procedure: AMPUTATION TOE 4th digit;  Surgeon: Candelaria Stagers, DPM;  Location: [facility];  Service: Podiatry;  Laterality: Left;   AMPUTATION TOE Left 09/04/2022   Procedure: LEFT BIG TOE AMPUTAION;  Surgeon: Vivi Barrack, DPM;  Location: [facility];  Service: Podiatry;  Laterality: Left;   ANTERIOR CERVICAL DECOMP/DISCECTOMY FUSION N/A 11/07/2014   Procedure: Anterior Cervical diskectomy and fusion Cervical four-five, Cervical five-six, Cervical six-seven;  Surgeon: Donalee Citrin, MD;  Location: [facility];  Service: Neurosurgery;  Laterality: N/A;   APPENDECTOMY     ARTERY BIOPSY Right 02/16/2017   Procedure: BIOPSY TEMPORAL ARTERY;  Surgeon: Renford Dills, MD;  Location: [facility];  Service: Vascular;  Laterality: Right;   ARTERY BIOPSY Left 04/16/2020   Procedure: BIOPSY TEMPORAL ARTERY;  Surgeon: Renford Dills, MD;  Location: [facility];  Service: Vascular;  Laterality: Left;  LEFT TEMPLE   BACK SURGERY     CARDIAC CATHETERIZATION     CATARACT EXTRACTION W/ INTRAOCULAR LENS  IMPLANT, BILATERAL Bilateral    CHOLECYSTECTOMY N/A 04/29/2016   Procedure: LAPAROSCOPIC CHOLECYSTECTOMY WITH  INTRAOPERATIVE CHOLANGIOGRAM;  Surgeon: Ovidio Kin, MD;  Location: [facility];  Service: General;  Laterality: N/A;   EP IMPLANTABLE DEVICE N/A 11/08/2015   MRI COMPATABLE -- Procedure: Pacemaker Implant;  Surgeon: Duke Salvia, MD;  Location: [facility];  Service: Cardiovascular;  Laterality: N/A;   INCISION AND DRAINAGE ABSCESS Right 05/30/2021   Procedure: IRRIGATION AND DEBRIDEMENT OF PREPATELLA BURSA;  Surgeon: Durene Romans, MD;  Location: [facility];  Service: Orthopedics;  Laterality: Right;  60 wants standard knee draping and pulse lavage   INCISION AND DRAINAGE ABSCESS Right 07/22/2021   Procedure: INCISION AND DRAINAGE ABSCESS RIGHT KNEE;  Surgeon: Durene Romans, MD;  Location: [facility];  Service: Orthopedics;  Laterality: Right;   INSERT / REPLACE / REMOVE PACEMAKER  2017   Dr[facility])   IR FLUORO GUIDE CV LINE RIGHT  07/25/2021   IR FLUORO GUIDE CV LINE RIGHT  11/04/2022   IR REMOVAL TUN CV CATH W/O FL  09/04/2021   IR US GUIDE VASC ACCESS RIGHT  07/25/2021   IR US GUIDE VASC ACCESS RIGHT  11/04/2022   JOINT REPLACEMENT     LEAD EXTRACTION N/A 09/08/2022   Procedure: LEAD EXTRACTION;  Surgeon: Marinus Maw, MD;  [facility];  Service: Cardiovascular;  Laterality: N/A;   LEAD EXTRACTION N/A 09/11/2022   Procedure: LEAD EXTRACTION;  Surgeon: Marinus Maw, MD;  [facility];  Service: Cardiovascular;  Laterality: N/A;   LUMBAR DISC SURGERY  1990's   x 2   TEE WITHOUT CARDIOVERSION N/A 09/07/2022   Procedure: TRANSESOPHAGEAL ECHOCARDIOGRAM;  Surgeon: Christell Constant, MD;  [facility]: [facility];  Service: Cardiovascular;  Laterality: N/A;   THYROIDECTOMY  2011   TOTAL KNEE ARTHROPLASTY  07/27/2011   Procedure: TOTAL KNEE ARTHROPLASTY;  Surgeon: Shelda Pal, MD;  Location: [facility];  Service: Orthopedics;  Laterality: Left;   UPPER ESOPHAGEAL ENDOSCOPIC ULTRASOUND (EUS) N/A 05/14/2016   Procedure: UPPER ESOPHAGEAL ENDOSCOPIC  ULTRASOUND (EUS);  Surgeon: Rachael Fee, MD;  Location: [facility];  Service: Endoscopy;  Laterality: N/A;  radial only-will be done mod if needed    Family History  Problem Relation Age of Onset   Heart Problems Mother    Heart defect Mother        valve problems   Heart Problems Father    Heart disease Brother    Lung cancer Daughter        non small cell    Diabetes Maternal Aunt    Diabetes Maternal Uncle    Throat cancer Brother    Colon cancer Neg Hx    Colon polyps Neg Hx    Esophageal cancer Neg Hx    Rectal cancer Neg Hx    Stomach cancer Neg Hx     Social History   Socioeconomic History   Marital status: Married    Spouse name: Dyamond Pongracz   Number of children: 3   Years of education: 12   Highest education level: Not on file  Occupational History   Occupation: retired  Tobacco Use   Smoking status: Former    Current packs/day: 0.00    Average packs/day: 2.0 packs/day for 33.0 years (66.0 ttl pk-yrs)    Types: Cigarettes    Start date: 1966    Quit date: 05/04/1997    Years since quitting: 25.8   Smokeless tobacco: Never  Vaping Use   Vaping status: Never Used  Substance and Sexual Activity   Alcohol use: No   Drug  use: No   Sexual activity: Not on file  Other Topics Concern   Not on file  Social History Narrative   Patient lives at home with her husband Fayrene Fearing .   Retired.   Education 12 th grade.   Right handed   Caffeine two cups of coffee.   Social Determinants of Health   Financial Resource Strain: Low Risk  (01/31/2021)   Overall Financial Resource Strain (CARDIA)    Difficulty of Paying Living Expenses: Not very hard  Food Insecurity: No Food Insecurity (11/04/2022)   Hunger Vital Sign    Worried About Running Out of Food in the Last Year: Never true    Ran Out of Food in the Last Year: Never true  Transportation Needs: No Transportation Needs (11/04/2022)   PRAPARE - Administrator, Civil Service (Medical): No    Lack of  Transportation (Non-Medical): No  Physical Activity: Insufficiently Active (09/30/2021)   Exercise Vital Sign    Days of Exercise per Week: 5 days    Minutes of Exercise per Session: 20 min  Stress: No Stress Concern Present (09/30/2021)   [employer] of Occupational Health - Occupational Stress Questionnaire    Feeling of Stress : Not at all  Social Connections: Socially Integrated (09/30/2021)   Social Connection and Isolation Panel [NHANES]    Frequency of Communication with Friends and Family: More than three times a week    Frequency of Social Gatherings with Friends and Family: Twice a week    Attends Religious Services: 1 to 4 times per year    Active Member of [employer] or Organizations: Yes    Attends Banker Meetings: 1 to 4 times per year    Marital Status: Married  Catering manager Violence: Not At Risk (11/04/2022)   Humiliation, Afraid, Rape, and Kick questionnaire    Fear of Current or Ex-Partner: No    Emotionally Abused: No    Physically Abused: No    Sexually Abused: No    Review of Systems  Respiratory:  Positive for apnea, cough and shortness of breath.   Psychiatric/Behavioral:  Positive for sleep disturbance.     Vitals:   02/16/23 1259  BP: 102/60  Pulse: 72  SpO2: 93%     Physical Exam Constitutional:      Appearance: She is obese.  HENT:     Head: Normocephalic.     Mouth/Throat:     Mouth: Mucous membranes are moist.  Eyes:     General: No scleral icterus.    Pupils: Pupils are equal, round, and reactive to light.  Cardiovascular:     Rate and Rhythm: Normal rate and regular rhythm.     Heart sounds: No murmur heard.    No friction rub.  Pulmonary:     Effort: No respiratory distress.     Breath sounds: Normal breath sounds. No stridor. No wheezing or rhonchi.  Musculoskeletal:     Cervical back: No rigidity or tenderness.  Neurological:     Mental Status: She is alert.  Psychiatric:        Mood and Affect: Mood normal.     Data Reviewed: 97% compliance Average use of 9 hours 29 minutes Auto CPAP 5-15 Residual AHI 1.4  Echocardiogram 02/11/2022 With ejection fraction of 60 to 65%  Assessment:  Mild obstructive sleep apnea -Continue CPAP on a nightly basis Bronchitis -Azithromycin and prednisone called to pharmacy  Chronic respiratory failure -Continue oxygen at 3 L  Moderate to  severe chronic obstructive pulmonary disease Continue Breztri Continue albuterol  Plan/Recommendations: Continue Breztri Continue albuterol Azithromycin called in Prednisone called in  Follow-up in about 3 months  Encouraged to call with any significant concerns  Continue CPAP on a nightly basis  Virl Diamond MD [facility] 02/16/2023, 1:04 PM  CC: Hamrick, Durward Fortes, MD

## 2023-02-16 NOTE — Patient Instructions (Signed)
Prescription for antibiotics and steroids sent to pharmacy for you  Continue to try and stay active  Continue breathing treatments  Continue using your CPAP nightly  Call us with significant concerns

## 2023-02-19 DIAGNOSIS — N186 End stage renal disease: Secondary | ICD-10-CM | POA: Diagnosis not present

## 2023-02-19 DIAGNOSIS — Z992 Dependence on renal dialysis: Secondary | ICD-10-CM | POA: Diagnosis not present

## 2023-02-19 DIAGNOSIS — N2581 Secondary hyperparathyroidism of renal origin: Secondary | ICD-10-CM | POA: Diagnosis not present

## 2023-02-22 DIAGNOSIS — Z992 Dependence on renal dialysis: Secondary | ICD-10-CM | POA: Diagnosis not present

## 2023-02-22 DIAGNOSIS — N2581 Secondary hyperparathyroidism of renal origin: Secondary | ICD-10-CM | POA: Diagnosis not present

## 2023-02-22 DIAGNOSIS — N186 End stage renal disease: Secondary | ICD-10-CM | POA: Diagnosis not present

## 2023-02-24 ENCOUNTER — Ambulatory Visit: Payer: Medicare HMO | Admitting: Cardiology

## 2023-02-26 DIAGNOSIS — Z992 Dependence on renal dialysis: Secondary | ICD-10-CM | POA: Diagnosis not present

## 2023-02-26 DIAGNOSIS — N186 End stage renal disease: Secondary | ICD-10-CM | POA: Diagnosis not present

## 2023-02-26 DIAGNOSIS — N2581 Secondary hyperparathyroidism of renal origin: Secondary | ICD-10-CM | POA: Diagnosis not present

## 2023-03-01 DIAGNOSIS — N2581 Secondary hyperparathyroidism of renal origin: Secondary | ICD-10-CM | POA: Diagnosis not present

## 2023-03-01 DIAGNOSIS — N186 End stage renal disease: Secondary | ICD-10-CM | POA: Diagnosis not present

## 2023-03-01 DIAGNOSIS — Z992 Dependence on renal dialysis: Secondary | ICD-10-CM | POA: Diagnosis not present

## 2023-03-02 DIAGNOSIS — Z23 Encounter for immunization: Secondary | ICD-10-CM | POA: Diagnosis not present

## 2023-03-02 DIAGNOSIS — N186 End stage renal disease: Secondary | ICD-10-CM | POA: Diagnosis not present

## 2023-03-02 DIAGNOSIS — Z794 Long term (current) use of insulin: Secondary | ICD-10-CM | POA: Diagnosis not present

## 2023-03-02 DIAGNOSIS — N2581 Secondary hyperparathyroidism of renal origin: Secondary | ICD-10-CM | POA: Diagnosis not present

## 2023-03-02 DIAGNOSIS — E890 Postprocedural hypothyroidism: Secondary | ICD-10-CM | POA: Diagnosis not present

## 2023-03-02 DIAGNOSIS — Z992 Dependence on renal dialysis: Secondary | ICD-10-CM | POA: Diagnosis not present

## 2023-03-02 DIAGNOSIS — I5030 Unspecified diastolic (congestive) heart failure: Secondary | ICD-10-CM | POA: Diagnosis not present

## 2023-03-02 DIAGNOSIS — E1140 Type 2 diabetes mellitus with diabetic neuropathy, unspecified: Secondary | ICD-10-CM | POA: Diagnosis not present

## 2023-03-02 DIAGNOSIS — E782 Mixed hyperlipidemia: Secondary | ICD-10-CM | POA: Diagnosis not present

## 2023-03-02 DIAGNOSIS — E790 Hyperuricemia without signs of inflammatory arthritis and tophaceous disease: Secondary | ICD-10-CM | POA: Diagnosis not present

## 2023-03-04 DIAGNOSIS — Z992 Dependence on renal dialysis: Secondary | ICD-10-CM | POA: Diagnosis not present

## 2023-03-04 DIAGNOSIS — N179 Acute kidney failure, unspecified: Secondary | ICD-10-CM | POA: Diagnosis not present

## 2023-03-04 DIAGNOSIS — N186 End stage renal disease: Secondary | ICD-10-CM | POA: Diagnosis not present

## 2023-03-05 DIAGNOSIS — N2581 Secondary hyperparathyroidism of renal origin: Secondary | ICD-10-CM | POA: Diagnosis not present

## 2023-03-05 DIAGNOSIS — Z992 Dependence on renal dialysis: Secondary | ICD-10-CM | POA: Diagnosis not present

## 2023-03-05 DIAGNOSIS — N186 End stage renal disease: Secondary | ICD-10-CM | POA: Diagnosis not present

## 2023-03-08 DIAGNOSIS — Z992 Dependence on renal dialysis: Secondary | ICD-10-CM | POA: Diagnosis not present

## 2023-03-08 DIAGNOSIS — N186 End stage renal disease: Secondary | ICD-10-CM | POA: Diagnosis not present

## 2023-03-08 DIAGNOSIS — N2581 Secondary hyperparathyroidism of renal origin: Secondary | ICD-10-CM | POA: Diagnosis not present

## 2023-03-09 ENCOUNTER — Other Ambulatory Visit (HOSPITAL_COMMUNITY): Payer: Medicare HMO

## 2023-03-09 ENCOUNTER — Encounter (HOSPITAL_COMMUNITY): Payer: Medicare HMO

## 2023-03-09 ENCOUNTER — Encounter: Payer: Medicare HMO | Admitting: Vascular Surgery

## 2023-03-11 ENCOUNTER — Other Ambulatory Visit: Payer: Self-pay | Admitting: *Deleted

## 2023-03-11 DIAGNOSIS — N186 End stage renal disease: Secondary | ICD-10-CM

## 2023-03-12 DIAGNOSIS — N2581 Secondary hyperparathyroidism of renal origin: Secondary | ICD-10-CM | POA: Diagnosis not present

## 2023-03-12 DIAGNOSIS — N186 End stage renal disease: Secondary | ICD-10-CM | POA: Diagnosis not present

## 2023-03-12 DIAGNOSIS — Z992 Dependence on renal dialysis: Secondary | ICD-10-CM | POA: Diagnosis not present

## 2023-03-15 DIAGNOSIS — Z992 Dependence on renal dialysis: Secondary | ICD-10-CM | POA: Diagnosis not present

## 2023-03-15 DIAGNOSIS — N2581 Secondary hyperparathyroidism of renal origin: Secondary | ICD-10-CM | POA: Diagnosis not present

## 2023-03-15 DIAGNOSIS — N186 End stage renal disease: Secondary | ICD-10-CM | POA: Diagnosis not present

## 2023-03-16 NOTE — Progress Notes (Unsigned)
Office Note     CC:  ESRD Requesting Provider:  Ailene Ravel, MD  HPI: JOHNINE [name] is a {Handed:22697} handed 82 y.o. (May 01, 1941) female with kidney disease who presents at the request of Hamrick, Durward Fortes, MD for permanent HD access. The patient has had *** prior access procedures. Per pt, previous tunneled lines have been placed in ***. Current access is ***. Dialysis days are ***.   On exam, ***  The pt is *** on a statin for cholesterol management.  The pt is *** on a daily aspirin.   Other AC:  *** The pt is *** on medications for hypertension.   The pt is *** diabetic. Tobacco hx:  ***  Past Medical History:  Diagnosis Date   Allergy    Anxiety    Arthritis    CAD (coronary artery disease)    Mild per remote cath in 2006, /    Nuclear, January, 2013, no significant abnormality,   Carotid artery disease (HCC)    Doppler, January, 2013, 0 39% RIC A., 40-59% LICA, stable   Cataract    CHF (congestive heart failure) (HCC)    Chronic kidney disease    Complication of anesthesia    wakes up "mean"   COPD 02/16/2007   Intolerant of Spiriva, tudorza Intolerant of Trelegy    COPD (chronic obstructive pulmonary disease) (HCC)    Coronary atherosclerosis 11/24/2008   Catheterization 2006, nonobstructive coronary disease   //   nuclear 2013, low risk     Diabetes mellitus, type 2 (HCC)    Diverticula of colon    DIVERTICULOSIS OF COLON 03/23/2007   Qualifier: Diagnosis of  By: Craige Cotta MD, Vineet     Dizziness    occasional   Dizzy spells 05/04/2011   DM (diabetes mellitus) (HCC) 03/23/2007   Qualifier: Diagnosis of  By: Craige Cotta MD, Vineet     Dyspnea    On exertion   Ejection fraction    Essential hypertension 02/16/2007   Qualifier: Diagnosis of  By: Earlene Plater CMA, Tammy     G E R D 03/23/2007   Qualifier: Diagnosis of  By: Craige Cotta MD, Vineet     Gall bladder disease 04/28/2016   Gastritis and gastroduodenitis    GERD (gastroesophageal reflux disease)    Goiter    hx  of   Headache(784.0)    migraine   Hyperlipidemia    Hypertension    Hypothyroidism    HYPOTHYROIDISM, POSTSURGICAL 06/04/2008   Qualifier: Diagnosis of  By: Everardo All MD, Sean A    Left ventricular hypertrophy    Leg edema    occasional swelling   Leg swelling 12/09/2018   Myofascial pain syndrome, cervical 01/06/2018   Obesity    OBESITY 11/24/2008   Qualifier: Diagnosis of  By: Vikki Ports     OBSTRUCTIVE SLEEP APNEA 02/16/2007   Auto CPAP 09/03/13 to 10/02/13 >> used on 30 of 30 nights with average 6 hrs and 3 min.  Average AHI is 3.1 with median CPAP 5 cm H2O and 95 th percentile CPAP 6 cm H2O.     Occipital neuralgia of right side 01/06/2018   OSA (obstructive sleep apnea)    cpap setting of 12   Pain in joint of right hip 01/21/2018   Palpitations 01/21/2011   48 hour Holter, 2015, scattered PACs and PVCs.    Paroxysmal atrial fibrillation (HCC)    Pneumonia    PONV (postoperative nausea and vomiting)    severe after every surgery  Pulmonary hypertension, secondary    RHINITIS 07/21/2010        RUQ abdominal pain    S/P left TKA 07/27/2011   Sinus node dysfunction (HCC) 11/08/2015   Sleep apnea    Spinal stenosis of cervical region 11/07/2014   Urinary frequency 12/09/2018   UTI (urinary tract infection) 08/15/2019   per pt report   VENTRICULAR HYPERTROPHY, LEFT 11/24/2008   Qualifier: Diagnosis of  By: Vikki Ports      Past Surgical History:  Procedure Laterality Date   ABDOMINAL HYSTERECTOMY     with appendectomy and colon polypectomy   AMPUTATION TOE Left 12/15/2019   Procedure: AMPUTATION  LEFT GREAT TOE;  Surgeon: Park Liter, DPM;  Location: [facility];  Service: Podiatry;  Laterality: Left;   AMPUTATION TOE Left 01/22/2022   Procedure: AMPUTATION TOE 4th digit;  Surgeon: Candelaria Stagers, DPM;  Location: [facility];  Service: Podiatry;  Laterality: Left;   AMPUTATION TOE Left 09/04/2022   Procedure: LEFT BIG TOE AMPUTAION;  Surgeon: Vivi Barrack, DPM;   Location: [facility];  Service: Podiatry;  Laterality: Left;   ANTERIOR CERVICAL DECOMP/DISCECTOMY FUSION N/A 11/07/2014   Procedure: Anterior Cervical diskectomy and fusion Cervical four-five, Cervical five-six, Cervical six-seven;  Surgeon: Donalee Citrin, MD;  Location: [facility];  Service: Neurosurgery;  Laterality: N/A;   APPENDECTOMY     ARTERY BIOPSY Right 02/16/2017   Procedure: BIOPSY TEMPORAL ARTERY;  Surgeon: Renford Dills, MD;  Location: [facility];  Service: Vascular;  Laterality: Right;   ARTERY BIOPSY Left 04/16/2020   Procedure: BIOPSY TEMPORAL ARTERY;  Surgeon: Renford Dills, MD;  Location: [facility];  Service: Vascular;  Laterality: Left;  LEFT TEMPLE   BACK SURGERY     CARDIAC CATHETERIZATION     CATARACT EXTRACTION W/ INTRAOCULAR LENS  IMPLANT, BILATERAL Bilateral    CHOLECYSTECTOMY N/A 04/29/2016   Procedure: LAPAROSCOPIC CHOLECYSTECTOMY WITH INTRAOPERATIVE CHOLANGIOGRAM;  Surgeon: Ovidio Kin, MD;  Location: [facility];  Service: General;  Laterality: N/A;   EP IMPLANTABLE DEVICE N/A 11/08/2015   MRI COMPATABLE -- Procedure: Pacemaker Implant;  Surgeon: Duke Salvia, MD;  [facility]: [facility];  Service: Cardiovascular;  Laterality: N/A;   INCISION AND DRAINAGE ABSCESS Right 05/30/2021   Procedure: IRRIGATION AND DEBRIDEMENT OF PREPATELLA BURSA;  Surgeon: Durene Romans, MD;  Location: [facility];  Service: Orthopedics;  Laterality: Right;  60 wants standard knee draping and pulse lavage   INCISION AND DRAINAGE ABSCESS Right 07/22/2021   Procedure: INCISION AND DRAINAGE ABSCESS RIGHT KNEE;  Surgeon: Durene Romans, MD;  Location: [facility];  Service: Orthopedics;  Laterality: Right;   INSERT / REPLACE / REMOVE PACEMAKER  2017   Dr. Clide [facility])   IR FLUORO GUIDE CV LINE RIGHT  07/25/2021   IR FLUORO GUIDE CV LINE RIGHT  11/04/2022   IR REMOVAL TUN CV CATH W/O FL  09/04/2021   IR US GUIDE VASC ACCESS RIGHT  07/25/2021   IR US GUIDE VASC ACCESS RIGHT  11/04/2022   JOINT  REPLACEMENT     LEAD EXTRACTION N/A 09/08/2022   Procedure: LEAD EXTRACTION;  Surgeon: Marinus Maw, MD;  [facility];  Service: Cardiovascular;  Laterality: N/A;   LEAD EXTRACTION N/A 09/11/2022   Procedure: LEAD EXTRACTION;  Surgeon: Marinus Maw, MD;  [facility];  Service: Cardiovascular;  Laterality: N/A;   LUMBAR DISC SURGERY  1990's   x 2   TEE WITHOUT CARDIOVERSION N/A 09/07/2022  Procedure: TRANSESOPHAGEAL ECHOCARDIOGRAM;  Surgeon: Christell Constant, MD;  [facility];  Service: Cardiovascular;  Laterality: N/A;   THYROIDECTOMY  2011   TOTAL KNEE ARTHROPLASTY  07/27/2011   Procedure: TOTAL KNEE ARTHROPLASTY;  Surgeon: Shelda Pal, MD;  Location: [facility];  Service: Orthopedics;  Laterality: Left;   UPPER ESOPHAGEAL ENDOSCOPIC ULTRASOUND (EUS) N/A 05/14/2016   Procedure: UPPER ESOPHAGEAL ENDOSCOPIC ULTRASOUND (EUS);  Surgeon: Rachael Fee, MD;  Location: [facility];  Service: Endoscopy;  Laterality: N/A;  radial only-will be done mod if needed    Social History   Socioeconomic History   Marital status: Married    Spouse name: Rabia Gormley   Number of children: 3   Years of education: 12   Highest education level: Not on file  Occupational History   Occupation: retired  Tobacco Use   Smoking status: Former    Current packs/day: 0.00    Average packs/day: 2.0 packs/day for 33.0 years (66.0 ttl pk-yrs)    Types: Cigarettes    Start date: 1966    Quit date: 05/04/1997    Years since quitting: 25.8   Smokeless tobacco: Never  Vaping Use   Vaping status: Never Used  Substance and Sexual Activity   Alcohol use: No   Drug use: No   Sexual activity: Not on file  Other Topics Concern   Not on file  Social History Narrative   Patient lives at home with her husband Fayrene Fearing .   Retired.   Education 12 th grade.   Right handed   Caffeine two cups of coffee.   Social Determinants of Health   Financial Resource  Strain: Low Risk  (01/31/2021)   Overall Financial Resource Strain (CARDIA)    Difficulty of Paying Living Expenses: Not very hard  Food Insecurity: No Food Insecurity (11/04/2022)   Hunger Vital Sign    Worried About Running Out of Food in the Last Year: Never true    Ran Out of Food in the Last Year: Never true  Transportation Needs: No Transportation Needs (11/04/2022)   PRAPARE - Administrator, Civil Service (Medical): No    Lack of Transportation (Non-Medical): No  Physical Activity: Insufficiently Active (09/30/2021)   Exercise Vital Sign    Days of Exercise per Week: 5 days    Minutes of Exercise per Session: 20 min  Stress: No Stress Concern Present (09/30/2021)   [employer] of Occupational Health - Occupational Stress Questionnaire    Feeling of Stress : Not at all  Social Connections: Socially Integrated (09/30/2021)   Social Connection and Isolation Panel [NHANES]    Frequency of Communication with Friends and Family: More than three times a week    Frequency of Social Gatherings with Friends and Family: Twice a week    Attends Religious Services: 1 to 4 times per year    Active Member of [employer] or Organizations: Yes    Attends Banker Meetings: 1 to 4 times per year    Marital Status: Married  Catering manager Violence: Not At Risk (11/04/2022)   Humiliation, Afraid, Rape, and Kick questionnaire    Fear of Current or Ex-Partner: No    Emotionally Abused: No    Physically Abused: No    Sexually Abused: No   *** Family History  Problem Relation Age of Onset   Heart Problems Mother    Heart defect Mother        valve problems   Heart Problems  Father    Heart disease Brother    Lung cancer Daughter        non small cell    Diabetes Maternal Aunt    Diabetes Maternal Uncle    Throat cancer Brother    Colon cancer Neg Hx    Colon polyps Neg Hx    Esophageal cancer Neg Hx    Rectal cancer Neg Hx    Stomach cancer Neg Hx     Current  Outpatient Medications  Medication Sig Dispense Refill   acetaminophen (TYLENOL) 500 MG tablet Take 1,000-1,500 mg by mouth every 8 (eight) hours as needed for mild pain, moderate pain or headache.     albuterol (PROVENTIL) (2.5 MG/3ML) 0.083% nebulizer solution Take 3 mLs (2.5 mg total) by nebulization every 2 (two) hours as needed for wheezing or shortness of breath. 75 mL 5   albuterol (VENTOLIN HFA) 108 (90 Base) MCG/ACT inhaler Inhale 2 puffs into the lungs every 6 (six) hours as needed for wheezing or shortness of breath. 3 each 3   Alcohol Swabs (B-D SINGLE USE SWABS REGULAR) PADS      allopurinol (ZYLOPRIM) 100 MG tablet Take 100 mg by mouth 2 (two) times daily.     amLODipine (NORVASC) 5 MG tablet Take 2 tablets (10 mg total) by mouth daily. (Patient taking differently: Take 5 mg by mouth daily.)     ascorbic acid (VITAMIN C) 500 MG tablet Take 500 mg by mouth daily with lunch.     atorvastatin (LIPITOR) 40 MG tablet Take 1 tablet (40 mg total) by mouth daily. (Patient taking differently: Take 40 mg by mouth at bedtime.) 90 tablet 3   azithromycin (ZITHROMAX Z-PAK) 250 MG tablet Take 2 tablets day 1 and then 1 daily for 4 days 6 each 0   Blood Glucose Calibration (TRUE METRIX LEVEL 1) Low SOLN      Budeson-Glycopyrrol-Formoterol (BREZTRI AEROSPHERE) 160-9-4.8 MCG/ACT AERO Inhale 2 puffs into the lungs in the morning and at bedtime. 3 each 10   Carboxymethylcellulose Sodium (ARTIFICIAL TEARS OP) Place 1 drop into both eyes daily as needed (dry eyes).     Doxercalciferol (HECTOROL IV) Doxercalciferol (Hectorol)     ELIQUIS 2.5 MG TABS tablet TAKE 1 TABLET TWICE DAILY 180 tablet 3   escitalopram (LEXAPRO) 5 MG tablet Take 1 tablet (5 mg total) by mouth daily. 30 tablet 0   ferrous sulfate 325 (65 FE) MG tablet Take 1 tablet (325 mg total) by mouth daily with breakfast. 90 tablet 3   fluticasone (FLONASE) 50 MCG/ACT nasal spray USE 2 SPRAYS NASALLY DAILY (Patient taking differently: Place 1  spray into both nostrils daily as needed for allergies.) 48 g 1   gabapentin (NEURONTIN) 800 MG tablet Take 800 mg by mouth 3 (three) times daily.     guaiFENesin (ROBITUSSIN) 100 MG/5ML liquid Take 5 mLs by mouth every 4 (four) hours as needed for cough or to loosen phlegm.     HYDROcodone-acetaminophen (NORCO) 10-325 MG tablet Take 1 tablet by mouth every 6 (six) hours as needed. (Patient taking differently: Take 1 tablet by mouth every 6 (six) hours as needed for severe pain (pain score 7-10).) 10 tablet 0   HYDROXYZINE HCL PO Take 25 mg by mouth at bedtime as needed (insomnia).     insulin aspart (NOVOLOG) 100 UNIT/ML FlexPen Inject 0-20 Units into the skin See admin instructions. Inject 0-20 units into the skin three times a day before meals "as needed," per [facility] SCALE  isosorbide mononitrate (IMDUR) 60 MG 24 hr tablet Take 1 tablet (60 mg total) by mouth daily. 30 tablet 0   levothyroxine (SYNTHROID) 200 MCG tablet Take 200 mcg by mouth daily before breakfast.     loratadine (CLARITIN) 10 MG tablet Take 1 tablet (10 mg total) by mouth daily.     melatonin 5 MG TABS Take 1 tablet (5 mg total) by mouth at bedtime as needed.  0   Methoxy PEG-Epoetin Beta (MIRCERA IJ) Mircera     multivitamin (RENA-VIT) TABS tablet Take 1 tablet by mouth at bedtime. 30 tablet 0   nitroGLYCERIN (NITROSTAT) 0.4 MG SL tablet Place 1 tablet (0.4 mg total) under the tongue every 5 (five) minutes as needed for chest pain. 25 tablet 4   polyethylene glycol (MIRALAX / GLYCOLAX) 17 g packet Take 17 g by mouth daily as needed for mild constipation or moderate constipation.  0   predniSONE (DELTASONE) 20 MG tablet Take 1 tablet (20 mg total) by mouth daily with breakfast. 7 tablet 0   PRESCRIPTION MEDICATION CPAP- At bedtime and as needed during any time of rest     promethazine (PHENERGAN) 25 MG suppository SMARTSIG:0.5-1 SUPPOS Rectally Every 6 Hours PRN     sevelamer carbonate (RENVELA) 800 MG tablet Take 1 tablet  (800 mg total) by mouth 3 (three) times daily with meals. 90 tablet 0   torsemide (DEMADEX) 100 MG tablet Take 1 tablet (100 mg total) by mouth 2 (two) times daily. 60 tablet 0   TRUE METRIX BLOOD GLUCOSE TEST test strip      TRUEplus Lancets 28G MISC      vitamin B-12 (CYANOCOBALAMIN) 1000 MCG tablet Take 1,000 mcg by mouth daily with lunch.     Vitamin D3 (VITAMIN D) 25 MCG tablet Take 1,000 Units by mouth daily with lunch.     No current facility-administered medications for this visit.    Allergies  Allergen Reactions   Dulaglutide Nausea Only, Other (See Comments) and Cough    TRULICITY made the patient sick to her stomach (only) several days after using it   Oxycodone Itching and Other (See Comments)    Pt still takes this med even thought it causes itching   Trelegy Ellipta [Fluticasone-Umeclidin-Vilant] Nausea And Vomiting, Other (See Comments) and Cough    Made the patient sick to her stomach (only) several days after using it      REVIEW OF SYSTEMS:  *** [X]  denotes positive finding, [ ]  denotes negative finding Cardiac  Comments:  Chest pain or chest pressure:    Shortness of breath upon exertion:    Short of breath when lying flat:    Irregular heart rhythm:        Vascular    Pain in calf, thigh, or hip brought on by ambulation:    Pain in feet at night that wakes you up from your sleep:     Blood clot in your veins:    Leg swelling:         Pulmonary    Oxygen at home:    Productive cough:     Wheezing:         Neurologic    Sudden weakness in arms or legs:     Sudden numbness in arms or legs:     Sudden onset of difficulty speaking or slurred speech:    Temporary loss of vision in one eye:     Problems with dizziness:  Gastrointestinal    Blood in stool:     Vomited blood:         Genitourinary    Burning when urinating:     Blood in urine:        Psychiatric    Major depression:         Hematologic    Bleeding problems:    Problems  with blood clotting too easily:        Skin    Rashes or ulcers:        Constitutional    Fever or chills:      PHYSICAL EXAMINATION:  There were no vitals filed for this visit.  General:  WDWN in NAD; vital signs documented above Gait: Not observed HENT: WNL, normocephalic Pulmonary: normal non-labored breathing , without Rales, rhonchi,  wheezing Cardiac: {Desc; regular/irreg:14544} HR, without  Murmurs {With/Without:20273} carotid bruit*** Abdomen: soft, NT, no masses Skin: {With/Without:20273} rashes Vascular Exam/Pulses:  Right Left  Radial {Exam; arterial pulse strength 0-4:30167} {Exam; arterial pulse strength 0-4:30167}  Ulnar {Exam; arterial pulse strength 0-4:30167} {Exam; arterial pulse strength 0-4:30167}  Femoral {Exam; arterial pulse strength 0-4:30167} {Exam; arterial pulse strength 0-4:30167}  Popliteal {Exam; arterial pulse strength 0-4:30167} {Exam; arterial pulse strength 0-4:30167}  DP {Exam; arterial pulse strength 0-4:30167} {Exam; arterial pulse strength 0-4:30167}  PT {Exam; arterial pulse strength 0-4:30167} {Exam; arterial pulse strength 0-4:30167}   Extremities: {With/Without:20273} ischemic changes, {With/Without:20273} Gangrene , {With/Without:20273} cellulitis; {With/Without:20273} open wounds;  Musculoskeletal: no muscle wasting or atrophy  Neurologic: A&O X 3;  No focal weakness or paresthesias are detected Psychiatric:  The pt has {Desc; normal/abnormal:11317::"Normal"} affect.   Non-Invasive Vascular Imaging:   ***    ASSESSMENT/PLAN:  CHANNAH BUDZINSKI is a 82 y.o. female who presents with {KidneyDisease:19197::"end stage renal disease","chronic kidney disease stage ***"}  Based on vein mapping and examination, ***. I had an extensive discussion with this patient in regards to the nature of access surgery, including risk, benefits, and alternatives.   The patient is aware that the risks of access surgery include but are not limited to:  bleeding, infection, steal syndrome, nerve damage, ischemic monomelic neuropathy, failure of access to mature, complications related to venous hypertension, and possible need for additional access procedures in the future. *** I discussed with the patient the nature of the staged access procedure, specifically the need for a second operation to transpose the first stage fistula if it matures adequately.   The patient has *** agreed to proceed with the above procedure which will be scheduled ***.  Victorino Sparrow, MD [facility] [phone]

## 2023-03-18 ENCOUNTER — Ambulatory Visit (HOSPITAL_COMMUNITY)
Admission: RE | Admit: 2023-03-18 | Discharge: 2023-03-18 | Disposition: A | Discharge status: Home / Self Care | Payer: Medicare HMO | Source: Ambulatory Visit | Attending: Vascular Surgery | Admitting: Vascular Surgery

## 2023-03-18 ENCOUNTER — Ambulatory Visit: Payer: Medicare HMO | Admitting: Vascular Surgery

## 2023-03-18 ENCOUNTER — Encounter: Payer: Self-pay | Admitting: Vascular Surgery

## 2023-03-18 VITALS — BP 124/56 | HR 53 | Temp 97.7°F | Resp 20 | Ht 65.0 in | Wt 190.0 lb

## 2023-03-18 DIAGNOSIS — N186 End stage renal disease: Secondary | ICD-10-CM | POA: Insufficient documentation

## 2023-03-18 DIAGNOSIS — Z992 Dependence on renal dialysis: Secondary | ICD-10-CM

## 2023-03-19 DIAGNOSIS — N2581 Secondary hyperparathyroidism of renal origin: Secondary | ICD-10-CM | POA: Diagnosis not present

## 2023-03-19 DIAGNOSIS — Z992 Dependence on renal dialysis: Secondary | ICD-10-CM | POA: Diagnosis not present

## 2023-03-19 DIAGNOSIS — N186 End stage renal disease: Secondary | ICD-10-CM | POA: Diagnosis not present

## 2023-03-22 ENCOUNTER — Other Ambulatory Visit: Payer: Self-pay

## 2023-03-22 DIAGNOSIS — Z992 Dependence on renal dialysis: Secondary | ICD-10-CM | POA: Diagnosis not present

## 2023-03-22 DIAGNOSIS — N2581 Secondary hyperparathyroidism of renal origin: Secondary | ICD-10-CM | POA: Diagnosis not present

## 2023-03-22 DIAGNOSIS — N186 End stage renal disease: Secondary | ICD-10-CM | POA: Diagnosis not present

## 2023-03-23 DIAGNOSIS — R2681 Unsteadiness on feet: Secondary | ICD-10-CM | POA: Diagnosis not present

## 2023-03-23 DIAGNOSIS — M353 Polymyalgia rheumatica: Secondary | ICD-10-CM | POA: Diagnosis not present

## 2023-03-23 DIAGNOSIS — M25569 Pain in unspecified knee: Secondary | ICD-10-CM | POA: Diagnosis not present

## 2023-03-23 DIAGNOSIS — M549 Dorsalgia, unspecified: Secondary | ICD-10-CM | POA: Diagnosis not present

## 2023-03-23 DIAGNOSIS — M1710 Unilateral primary osteoarthritis, unspecified knee: Secondary | ICD-10-CM | POA: Diagnosis not present

## 2023-03-23 DIAGNOSIS — Z1389 Encounter for screening for other disorder: Secondary | ICD-10-CM | POA: Diagnosis not present

## 2023-03-23 DIAGNOSIS — Z79891 Long term (current) use of opiate analgesic: Secondary | ICD-10-CM | POA: Diagnosis not present

## 2023-03-23 DIAGNOSIS — M8580 Other specified disorders of bone density and structure, unspecified site: Secondary | ICD-10-CM | POA: Diagnosis not present

## 2023-03-23 DIAGNOSIS — E349 Endocrine disorder, unspecified: Secondary | ICD-10-CM | POA: Diagnosis not present

## 2023-03-26 DIAGNOSIS — N186 End stage renal disease: Secondary | ICD-10-CM | POA: Diagnosis not present

## 2023-03-26 DIAGNOSIS — Z992 Dependence on renal dialysis: Secondary | ICD-10-CM | POA: Diagnosis not present

## 2023-03-26 DIAGNOSIS — N2581 Secondary hyperparathyroidism of renal origin: Secondary | ICD-10-CM | POA: Diagnosis not present

## 2023-03-28 DIAGNOSIS — N186 End stage renal disease: Secondary | ICD-10-CM | POA: Diagnosis not present

## 2023-03-28 DIAGNOSIS — N2581 Secondary hyperparathyroidism of renal origin: Secondary | ICD-10-CM | POA: Diagnosis not present

## 2023-03-28 DIAGNOSIS — Z992 Dependence on renal dialysis: Secondary | ICD-10-CM | POA: Diagnosis not present

## 2023-03-29 IMAGING — DX DG CHEST 2V
2 series · 2 of 2 positions shown · non-contrast
Comparison: 10/02/2019

CLINICAL DATA: Cough, COPD

EXAM:
CHEST - 2 VIEW

[chest pa]
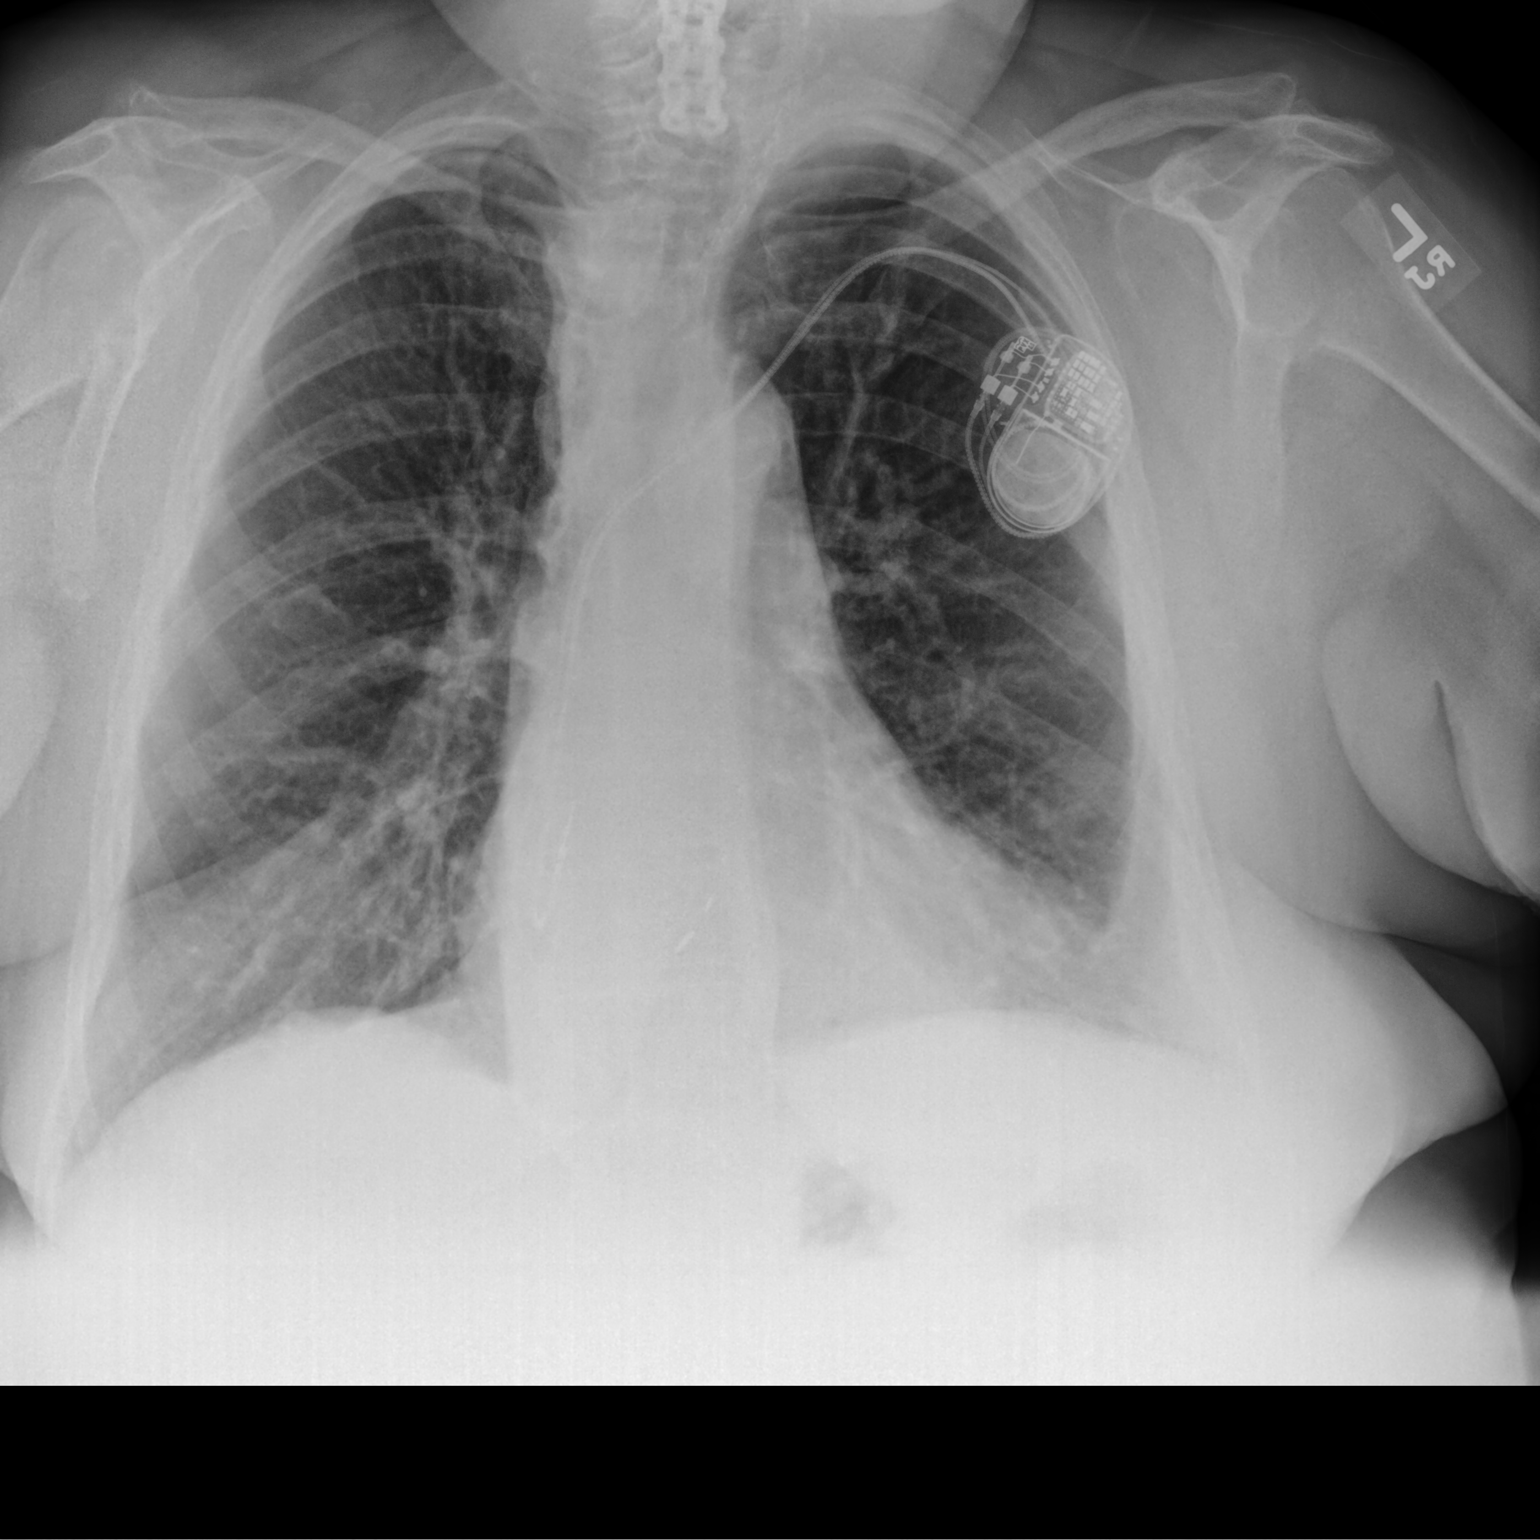

[chest lat]
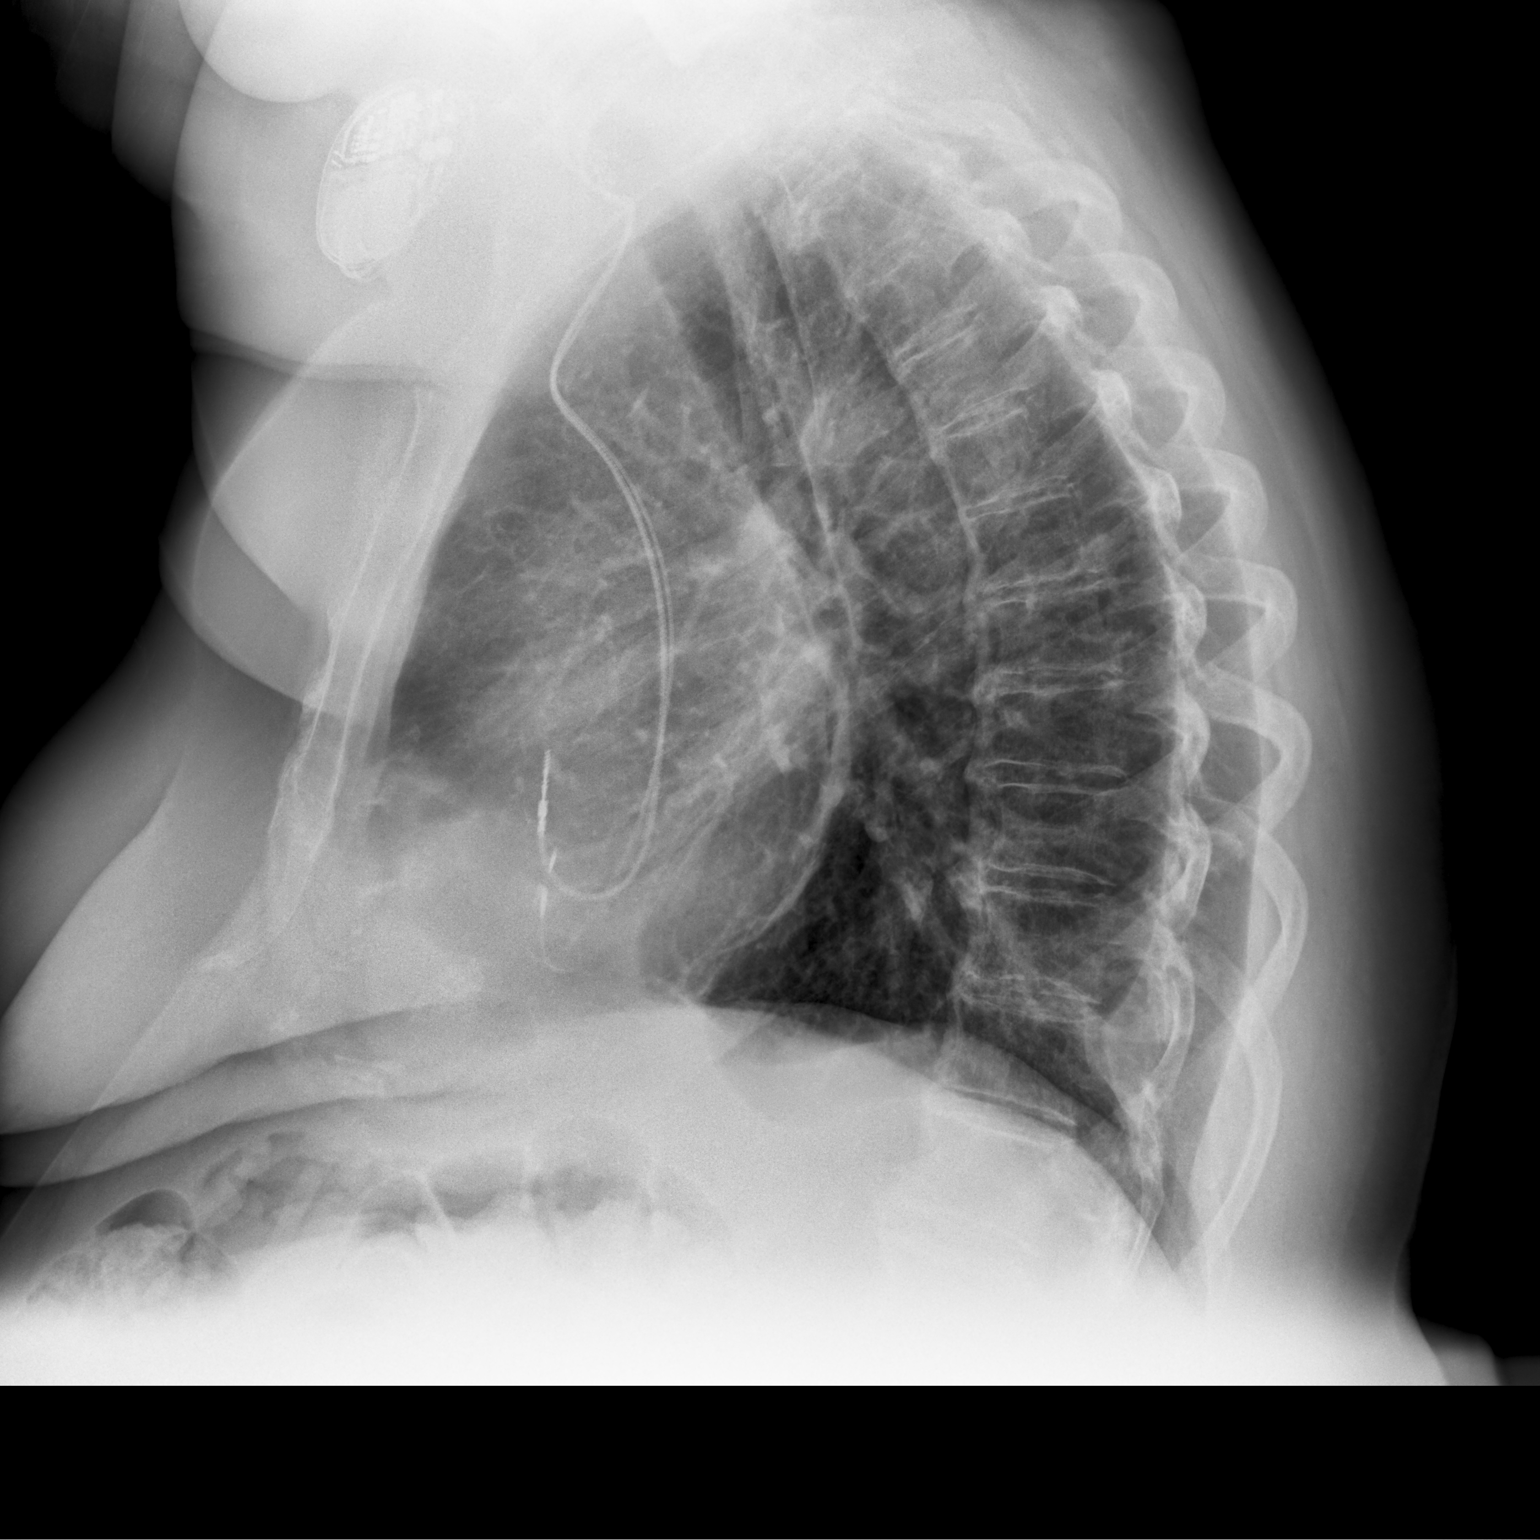

[2 of 2 positions shown; findings below may reference images not displayed]

FINDINGS: Heart size and vascularity normal. Lungs clear without infiltrate or
effusion. Dual lead pacemaker unchanged. ACDF lower cervical spine.
No change from the prior study.
IMPRESSION: No active cardiopulmonary disease.

## 2023-03-31 ENCOUNTER — Other Ambulatory Visit: Payer: Self-pay | Admitting: Cardiology

## 2023-04-02 DIAGNOSIS — Z992 Dependence on renal dialysis: Secondary | ICD-10-CM | POA: Diagnosis not present

## 2023-04-02 DIAGNOSIS — N2581 Secondary hyperparathyroidism of renal origin: Secondary | ICD-10-CM | POA: Diagnosis not present

## 2023-04-02 DIAGNOSIS — N186 End stage renal disease: Secondary | ICD-10-CM | POA: Diagnosis not present

## 2023-04-03 DIAGNOSIS — Z992 Dependence on renal dialysis: Secondary | ICD-10-CM | POA: Diagnosis not present

## 2023-04-03 DIAGNOSIS — N179 Acute kidney failure, unspecified: Secondary | ICD-10-CM | POA: Diagnosis not present

## 2023-04-03 DIAGNOSIS — N186 End stage renal disease: Secondary | ICD-10-CM | POA: Diagnosis not present

## 2023-04-05 DIAGNOSIS — Z992 Dependence on renal dialysis: Secondary | ICD-10-CM | POA: Diagnosis not present

## 2023-04-05 DIAGNOSIS — N2581 Secondary hyperparathyroidism of renal origin: Secondary | ICD-10-CM | POA: Diagnosis not present

## 2023-04-05 DIAGNOSIS — N186 End stage renal disease: Secondary | ICD-10-CM | POA: Diagnosis not present

## 2023-04-09 DIAGNOSIS — N186 End stage renal disease: Secondary | ICD-10-CM | POA: Diagnosis not present

## 2023-04-09 DIAGNOSIS — N2581 Secondary hyperparathyroidism of renal origin: Secondary | ICD-10-CM | POA: Diagnosis not present

## 2023-04-09 DIAGNOSIS — Z992 Dependence on renal dialysis: Secondary | ICD-10-CM | POA: Diagnosis not present

## 2023-04-11 ENCOUNTER — Encounter (HOSPITAL_COMMUNITY): Payer: Self-pay | Admitting: Vascular Surgery

## 2023-04-11 ENCOUNTER — Other Ambulatory Visit: Payer: Self-pay

## 2023-04-11 NOTE — Progress Notes (Signed)
PCP - Hamrick, Durward Fortes, MD Cardiologist - Jake Bathe, MD  EKG - 10/26/2022 Chest x-ray - 09/2022 ECHO - 10/25/2022 Stress Test- 2023 Cardiac Cath - Denies  Sleep Study CPAP - Yes  DM II Fasting Blood Sugar:  90 to 105 Checks Blood Sugar:  3 times/day Blood Thinner Instructions: Hold Eliquis x 2 days .Per pt last dose 12/7 Aspirin Instructions: N/A  ERAS Protcol - N/A COVID TEST- N/A  Anesthesia review: Yes. Cardiac hx  -------------  SDW INSTRUCTIONS:  Your procedure is scheduled on Tuesday Dec 10 th. Please report to [facility] Main Entrance "A" at 0645 A.M., and check in at the Admitting office. Call this number if you have problems the morning of surgery: [phone]   Remember: Do not eat or drink after midnight the night before your surgery    Medications to take morning of surgery with a sip of water include: amLODipine (NORVASC)  escitalopram (LEXAPRO)  gabapentin (NEURONTIN)  levothyroxine (SYNTHROID) loratadine (CLARITIN)   predniSONE (DELTASONE)   IF NEEDED acetaminophen (TYLENOL)  albuterol (PROVENTIL)  Carboxymethylcellulose Sodium (ARTIFICIAL TEARS OP)  fluticasone (FLONASE)   As of today, STOP taking any Aspirin (unless otherwise instructed by your surgeon), Aleve, Naproxen, Ibuprofen, Motrin, Advil, Goody's, BC's, all herbal medications, fish oil, and all vitamins.  WHAT DO I DO ABOUT MY DIABETES MEDICATION?   Do not take oral diabetes medicines (pills) the morning of surgery.    The day of surgery, do not take other diabetes injectables, including Byetta (exenatide), Bydureon (exenatide ER), Victoza (liraglutide), or Trulicity (dulaglutide).  If your CBG is greater than 220 mg/dL, you may take  of your sliding scale (correction) dose of insulin.   HOW TO MANAGE YOUR DIABETES BEFORE AND AFTER SURGERY  Why is it important to control my blood sugar before and after surgery? Improving blood sugar levels before and after surgery helps  healing and can limit problems. A way of improving blood sugar control is eating a healthy diet by:  Eating less sugar and carbohydrates  Increasing activity/exercise  Talking with your doctor about reaching your blood sugar goals High blood sugars (greater than 180 mg/dL) can raise your risk of infections and slow your recovery, so you will need to focus on controlling your diabetes during the weeks before surgery. Make sure that the doctor who takes care of your diabetes knows about your planned surgery including the date and location.  How do I manage my blood sugar before surgery? Check your blood sugar at least 4 times a day, starting 2 days before surgery, to make sure that the level is not too high or low.  Check your blood sugar the morning of your surgery when you wake up and every 2 hours until you get to the Short Stay unit.  If your blood sugar is less than 70 mg/dL, you will need to treat for low blood sugar: Do not take insulin. Treat a low blood sugar (less than 70 mg/dL) with  cup of clear juice (cranberry or apple), 4 glucose tablets, OR glucose gel. Recheck blood sugar in 15 minutes after treatment (to make sure it is greater than 70 mg/dL). If your blood sugar is not greater than 70 mg/dL on recheck, call [phone] for further instructions. Report your blood sugar to the short stay nurse when you get to Short Stay.  If you are admitted to the hospital after surgery: Your blood sugar will be checked by the staff and you will probably be given insulin  after surgery (instead of oral diabetes medicines) to make sure you have good blood sugar levels. The goal for blood sugar control after surgery is 80-180 mg/dL.    The Morning of Surgery Do not wear jewelry, make-up or nail polish. Do not wear lotions, powders, or perfumes/colognes, or deodorant Do not bring valuables to the hospital. [facility] is not responsible for any belongings or valuables.  If you are a smoker,  DO NOT Smoke 24 hours prior to surgery  If you wear a CPAP at night please bring your mask the morning of surgery   Remember that you must have someone to transport you home after your surgery, and remain with you for 24 hours if you are discharged the same day.  Please bring cases for contacts, glasses, hearing aids, dentures or bridgework because it cannot be worn into surgery.   Patients discharged the day of surgery will not be allowed to drive home.   Please shower the NIGHT BEFORE/MORNING OF SURGERY (use antibacterial soap like DIAL soap if possible). Wear comfortable clothes the morning of surgery. Oral Hygiene is also important to reduce your risk of infection.  Remember - BRUSH YOUR TEETH THE MORNING OF SURGERY WITH YOUR REGULAR TOOTHPASTE  Patient denies shortness of breath, fever, cough and chest pain.

## 2023-04-12 DIAGNOSIS — N186 End stage renal disease: Secondary | ICD-10-CM | POA: Diagnosis not present

## 2023-04-12 DIAGNOSIS — N2581 Secondary hyperparathyroidism of renal origin: Secondary | ICD-10-CM | POA: Diagnosis not present

## 2023-04-12 DIAGNOSIS — Z992 Dependence on renal dialysis: Secondary | ICD-10-CM | POA: Diagnosis not present

## 2023-04-12 NOTE — Progress Notes (Signed)
Anesthesia Chart Review: Maury Dus  Case: [id number] Date/Time: 04/13/23 0901   Procedure: LEFT ARM ARTERIOVENOUS (AV) FISTULA CREATION (Left)   Anesthesia type: Monitor Anesthesia Care   Pre-op diagnosis: End Stage Renal Disease   [facility]OM 11 / [facility]   Surgeons: Victorino Sparrow, MD       DISCUSSION: Patient is an 82 year old female scheduled for the above procedure. As of 03/18/23, she has undergoing HD via right internal jugular TDC on M/F.   History includes former smoker (quit 05/04/97), post-operative N/V, HTN, CAD (mild 01/12/05 LHC, 12/31/17 CCTA), PAF, pulmonary hypertension, CHF, bradycardia (s/p Medtronic PPM 11/09/15, extracted 09/07/25 due to MRSA bacteria with RV lead vegetation), DM2, OSA (uses CPAP), COPD (3L home O2), ESRD (HD initiated 11/11/22), PAD (left great toe amputation 12/15/19; left 4th toe amputation 01/22/22; left partial 1st ray amputation 09/04/22), GERD, thyroid goiter (s/p thyroidectomy 04/17/08, post surgical hypothyroidism), spinal surgery C4-7 ACDF 11/07/14), osteoarthritis (left TKA 07/27/11), infected right prepatellar bursa (s/p excisional debridement 05/31/21, 07/22/21), cholecystectomy (04/29/16). Reported waking up from anesthesia "mean."  [place] admission 09/03/22 - 09/15/22 for diabetic left foot infection. She was started on IV antibiotic and underwent left partial first ray amputation on 09/04/22. Blood and wound cultures positive for MRSA. TEE showed vegetation on RV PPM lead. S/p PPM and lead extraction 09/08/22. A temporary pacemaker was placed but no further evidence of chronotropic incompetence/SSS at that time and removed without plan for PPM. Recommendation for daptomycin x 6 weeks via PICC per ID. B-blocker held due to bradycardia. Discharged to [facility].   [place] admission 10/25/22 - 11/14/22 for pulmonary edema, acute kidney injury on chronic kidney disease stage IV with oliguria versus anuria, requiring CRRT x 48 hours and HD initiated  11/11/22.   Followed by pulmonologist Dr. Wynona Neat for mild OSA on CPAP and for moderate to severe COPD with chronic hypoxic respiratory failure on continuous 3L O2. Last visit 02/16/23. Continue Breztri and albuterol. Continue CPAP. Prescribed azithromycin and prednisone for bronchitis.    Last cardiology visit was with Graciella Freer, PA-c on 01/05/23 for EP follow-up. Reimplantation of PPM was not felt warranted following extraction in May 2024 for MRSA bacteremia. Known junctional bradycardia in the 30's during admission for progression to ESRD.At last follow-up, patient tolerating HD. Some fatigue and moderate exertional dyspnea. She wanting to avoid pacing if possible. Continue to avoid b-blockers. He wrote, "no indication for PPM at this time. No plans for reimplantation unless she has symptomatic bradycardia." Volume status stable at that time. Continue Eliquis for PAT with CHA2DS2/VASc of at least 7.  Follow-up in 6 months planned.   Anesthesia team to evaluate on the day of surgery. Hold Eliquis for 2 days per VVS.   VS:  BP Readings from Last 3 Encounters:  03/18/23 (!) 124/56  02/16/23 102/60  01/05/23 (!) 134/46   Pulse Readings from Last 3 Encounters:  03/18/23 (!) 53  02/16/23 72  01/05/23 76     PROVIDERS: Hamrick, Durward Fortes, MD is PCP  Donato Schultz, MD is cardiologist Virl Diamond, MD is pulmonologist Bufford Buttner, MD is nephrologist Sherryl Manges, MD is EP cardiologist   LABS: For day of surgery. Last results in [facility] include: Lab Results  Component Value Date   WBC 9.5 11/14/2022   HGB 10.7 (L) 11/14/2022   HCT 36.9 11/14/2022   PLT 394 11/14/2022   GLUCOSE 204 (H) 11/14/2022   ALT 18 10/26/2022   AST 14 (  L) 10/26/2022   NA 135 11/14/2022   K 3.8 11/14/2022   CL 98 11/14/2022   CREATININE 3.11 (H) 11/14/2022   BUN 16 11/14/2022   CO2 27 11/14/2022   TSH 0.524 01/15/2021   INR 1.2 09/04/2022   HGBA1C 6.8 (H) 09/04/2022     EKG: EKG  10/25/22: Junctional bradycardia at 36 bpm Confirmed by Kristine Royal [phone]) on 10/25/2022 9:53:08 AM   CV: Echo (Limited) 10/25/22: IMPRESSIONS   1. Left ventricular ejection fraction, by estimation, is 70 to 75%. Left  ventricular ejection fraction by PLAX is 66 %. The left ventricle has  hyperdynamic function. The left ventricle has no regional wall motion  abnormalities. There is mild concentric  left ventricular hypertrophy.   2. There is mild calcification of the aortic valve. There is moderate  thickening of the aortic valve.   EP Procedure 09/11/22: Conclusion: Successful explantation of a Medtronic temporary permanent transvenous pacemaker in a patient with prior severe sinus node dysfunction pacemaker such that an indwelling pacemaker is not presently required.   EP Procedure 09/08/22: Conclusion: Successful extraction of a dual-chamber pacing system in a patient with marked sinus node dysfunction and insertion of a temporary permanent transvenous pacing lead in a patient with MRSA bacteremia and MRSA pacemaker lead endocarditis.   TEE 09/07/22: IMPRESSIONS   1. There is a 1.64 cm X 0.78 cm X 0.4 cm vegetation on the right  ventricular CIED lead at the level of the right atrium. Right ventricular  systolic function is normal. The right ventricular size is mildly  enlarged.   2. Left ventricular ejection fraction, by estimation, is 60 to 65%. The  left ventricle has normal function.   3. Left atrial size was mildly dilated. No left atrial/left atrial  appendage thrombus was detected.   4. Right atrial size was mildly dilated.   5. The mitral valve is degenerative. No evidence of mitral valve  regurgitation. No evidence of mitral stenosis. Moderate mitral annular  calcification.   6. Tricuspid valve regurgitation is moderate.   7. The aortic valve is tricuspid. Aortic valve regurgitation is trivial.  Aortic valve sclerosis is present, with no evidence of aortic valve   stenosis.   8. There is Moderate (Grade III) plaque involving the descending aorta.   US Carotid 07/09/21: Summary:  - Right Carotid: Velocities in the right ICA are consistent with a 1-39%  stenosis.  - Left Carotid: Velocities in the left ICA are consistent with a 1-39%  stenosis.  - Vertebrals: Bilateral vertebral arteries demonstrate antegrade flow.  - Subclavians: Normal flow hemodynamics were seen in bilateral subclavian               arteries.   CT Coronary with FFR 12/31/17: IMPRESSION: 1. Coronary calcium score of 1422. This was 65 percentile for age and sex matched control. 2. Normal coronary origin with right dominance.  3. Diffuse plaque with moderate disease in the RCA and LAD. Additional analysis with CT FFR will be submitted. 1. Left Main:  No significant stenosis. 2. LAD: No significant stenosis. 3. LCX: No significant stenosis. 4. OM1: No significant stenosis. 5. OM2: Distal OM2: CT FFR: 0.78. 6. OM3: No significant stenosis. 7. RCA: No significant stenosis.   IMPRESSION: 1. CT FFR analysis showed stenosis in the distal portion of a small OM2 artery. Aggressive medical management is recommended.   Past Medical History:  Diagnosis Date   Allergy    Anxiety    Arthritis    CAD (  coronary artery disease)    Mild per remote cath in 2006, /    Nuclear, January, 2013, no significant abnormality,   Carotid artery disease (HCC)    Doppler, January, 2013, 0 39% RIC A., 40-59% LICA, stable   Cataract    CHF (congestive heart failure) (HCC)    Chronic kidney disease    Complication of anesthesia    wakes up "mean"   COPD 02/16/2007   Intolerant of Spiriva, tudorza Intolerant of Trelegy    COPD (chronic obstructive pulmonary disease) (HCC)    Coronary atherosclerosis 11/24/2008   Catheterization 2006, nonobstructive coronary disease   //   nuclear 2013, low risk     Diabetes mellitus, type 2 (HCC)    Diverticula of colon    DIVERTICULOSIS OF COLON 03/23/2007    Qualifier: Diagnosis of  By: Craige Cotta MD, Vineet     Dizziness    occasional   Dizzy spells 05/04/2011   DM (diabetes mellitus) (HCC) 03/23/2007   Qualifier: Diagnosis of  By: Craige Cotta MD, Vineet     Dyspnea    On exertion   Ejection fraction    Essential hypertension 02/16/2007   Qualifier: Diagnosis of  By: Earlene Plater CMA, Tammy     G E R D 03/23/2007   Qualifier: Diagnosis of  By: Craige Cotta MD, Vineet     Gall bladder disease 04/28/2016   Gastritis and gastroduodenitis    GERD (gastroesophageal reflux disease)    Goiter    hx of   Headache(784.0)    migraine   Hyperlipidemia    Hypertension    Hypothyroidism    HYPOTHYROIDISM, POSTSURGICAL 06/04/2008   Qualifier: Diagnosis of  By: Everardo All MD, Sean A    Left ventricular hypertrophy    Leg edema    occasional swelling   Leg swelling 12/09/2018   Myofascial pain syndrome, cervical 01/06/2018   Obesity    OBESITY 11/24/2008   Qualifier: Diagnosis of  By: Vikki Ports     OBSTRUCTIVE SLEEP APNEA 02/16/2007   Auto CPAP 09/03/13 to 10/02/13 >> used on 30 of 30 nights with average 6 hrs and 3 min.  Average AHI is 3.1 with median CPAP 5 cm H2O and 95 th percentile CPAP 6 cm H2O.     Occipital neuralgia of right side 01/06/2018   OSA (obstructive sleep apnea)    cpap setting of 12   Pain in joint of right hip 01/21/2018   Palpitations 01/21/2011   48 hour Holter, 2015, scattered PACs and PVCs.    Paroxysmal atrial fibrillation (HCC)    Pneumonia    PONV (postoperative nausea and vomiting)    severe after every surgery   Pulmonary hypertension, secondary    RHINITIS 07/21/2010        RUQ abdominal pain    S/P left TKA 07/27/2011   Sinus node dysfunction (HCC) 11/08/2015   Sleep apnea    Spinal stenosis of cervical region 11/07/2014   Urinary frequency 12/09/2018   UTI (urinary tract infection) 08/15/2019   per pt report   VENTRICULAR HYPERTROPHY, LEFT 11/24/2008   Qualifier: Diagnosis of  By: Vikki Ports      Past Surgical  History:  Procedure Laterality Date   ABDOMINAL HYSTERECTOMY     with appendectomy and colon polypectomy   AMPUTATION TOE Left 12/15/2019   Procedure: AMPUTATION  LEFT GREAT TOE;  Surgeon: Park Liter, DPM;  Location: [facility];  Service: Podiatry;  Laterality: Left;   AMPUTATION TOE Left 01/22/2022   Procedure:  AMPUTATION TOE 4th digit;  Surgeon: Candelaria Stagers, DPM;  Location: [facility];  Service: Podiatry;  Laterality: Left;   AMPUTATION TOE Left 09/04/2022   Procedure: LEFT BIG TOE AMPUTAION;  Surgeon: Vivi Barrack, DPM;  Location: [facility];  Service: Podiatry;  Laterality: Left;   ANTERIOR CERVICAL DECOMP/DISCECTOMY FUSION N/A 11/07/2014   Procedure: Anterior Cervical diskectomy and fusion Cervical four-five, Cervical five-six, Cervical six-seven;  Surgeon: Donalee Citrin, MD;  Location: [facility];  Service: Neurosurgery;  Laterality: N/A;   APPENDECTOMY     ARTERY BIOPSY Right 02/16/2017   Procedure: BIOPSY TEMPORAL ARTERY;  Surgeon: Renford Dills, MD;  Location: [facility] ORS;  Service: Vascular;  Laterality: Right;   ARTERY BIOPSY Left 04/16/2020   Procedure: BIOPSY TEMPORAL ARTERY;  Surgeon: Renford Dills, MD;  Location: [facility] ORS;  Service: Vascular;  Laterality: Left;  LEFT TEMPLE   BACK SURGERY     CARDIAC CATHETERIZATION     CATARACT EXTRACTION W/ INTRAOCULAR LENS  IMPLANT, BILATERAL Bilateral    CHOLECYSTECTOMY N/A 04/29/2016   Procedure: LAPAROSCOPIC CHOLECYSTECTOMY WITH INTRAOPERATIVE CHOLANGIOGRAM;  Surgeon: Ovidio Kin, MD;  Location: [facility];  Service: General;  Laterality: N/A;   EP IMPLANTABLE DEVICE N/A 11/08/2015   MRI COMPATABLE -- Procedure: Pacemaker Implant;  Surgeon: Duke Salvia, MD;  [facility];  Service: Cardiovascular;  Laterality: N/A;   INCISION AND DRAINAGE ABSCESS Right 05/30/2021   Procedure: IRRIGATION AND DEBRIDEMENT OF PREPATELLA BURSA;  Surgeon: Durene Romans, MD;  Location: [facility];  Service: Orthopedics;  Laterality: Right;  60 wants  standard knee draping and pulse lavage   INCISION AND DRAINAGE ABSCESS Right 07/22/2021   Procedure: INCISION AND DRAINAGE ABSCESS RIGHT KNEE;  Surgeon: Durene Romans, MD;  Location: [facility];  Service: Orthopedics;  Laterality: Right;   INSERT / REPLACE / REMOVE PACEMAKER  2017   Dr. Clide [facility])   IR FLUORO GUIDE CV LINE RIGHT  07/25/2021   IR FLUORO GUIDE CV LINE RIGHT  11/04/2022   IR REMOVAL TUN CV CATH W/O FL  09/04/2021   IR US GUIDE VASC ACCESS RIGHT  07/25/2021   IR US GUIDE VASC ACCESS RIGHT  11/04/2022   JOINT REPLACEMENT     LEAD EXTRACTION N/A 09/08/2022   Procedure: LEAD EXTRACTION;  Surgeon: Marinus Maw, MD;  [facility];  Service: Cardiovascular;  Laterality: N/A;   LEAD EXTRACTION N/A 09/11/2022   Procedure: LEAD EXTRACTION;  Surgeon: Marinus Maw, MD;  [facility];  Service: Cardiovascular;  Laterality: N/A;   LUMBAR DISC SURGERY  1990's   x 2   TEE WITHOUT CARDIOVERSION N/A 09/07/2022   Procedure: TRANSESOPHAGEAL ECHOCARDIOGRAM;  Surgeon: Christell Constant, MD;  [facility];  Service: Cardiovascular;  Laterality: N/A;   THYROIDECTOMY  2011   TOTAL KNEE ARTHROPLASTY  07/27/2011   Procedure: TOTAL KNEE ARTHROPLASTY;  Surgeon: Shelda Pal, MD;  [facility]: [facility];  Service: Orthopedics;  Laterality: Left;   UPPER ESOPHAGEAL ENDOSCOPIC ULTRASOUND (EUS) N/A 05/14/2016   Procedure: UPPER ESOPHAGEAL ENDOSCOPIC ULTRASOUND (EUS);  Surgeon: Rachael Fee, MD;  Location: [facility];  Service: Endoscopy;  Laterality: N/A;  radial only-will be done mod if needed    MEDICATIONS: No current facility-administered medications for this encounter.    acetaminophen (TYLENOL) 500 MG tablet   albuterol (PROVENTIL) (2.5 MG/3ML) 0.083% nebulizer solution   albuterol (VENTOLIN HFA) 108 (90 Base) MCG/ACT inhaler   Alcohol Swabs (B-D SINGLE  USE SWABS REGULAR) PADS   allopurinol (ZYLOPRIM) 100 MG tablet   amLODipine (NORVASC) 5 MG  tablet   ascorbic acid (VITAMIN C) 500 MG tablet   atorvastatin (LIPITOR) 40 MG tablet   azithromycin (ZITHROMAX Z-PAK) 250 MG tablet   Blood Glucose Calibration (TRUE METRIX LEVEL 1) Low SOLN   Budeson-Glycopyrrol-Formoterol (BREZTRI AEROSPHERE) 160-9-4.8 MCG/ACT AERO   Carboxymethylcellulose Sodium (ARTIFICIAL TEARS OP)   Doxercalciferol (HECTOROL IV)   ELIQUIS 2.5 MG TABS tablet   escitalopram (LEXAPRO) 5 MG tablet   ferrous sulfate 325 (65 FE) MG tablet   fluticasone (FLONASE) 50 MCG/ACT nasal spray   gabapentin (NEURONTIN) 800 MG tablet   guaiFENesin (ROBITUSSIN) 100 MG/5ML liquid   HYDROcodone-acetaminophen (NORCO) 10-325 MG tablet   HYDROXYZINE HCL PO   insulin aspart (NOVOLOG) 100 UNIT/ML FlexPen   isosorbide mononitrate (IMDUR) 60 MG 24 hr tablet   levothyroxine (SYNTHROID) 200 MCG tablet   loratadine (CLARITIN) 10 MG tablet   melatonin 5 MG TABS   Methoxy PEG-Epoetin Beta (MIRCERA IJ)   multivitamin (RENA-VIT) TABS tablet   nitroGLYCERIN (NITROSTAT) 0.4 MG SL tablet   polyethylene glycol (MIRALAX / GLYCOLAX) 17 g packet   predniSONE (DELTASONE) 20 MG tablet   PRESCRIPTION MEDICATION   promethazine (PHENERGAN) 25 MG suppository   sevelamer carbonate (RENVELA) 800 MG tablet   torsemide (DEMADEX) 100 MG tablet   TRUE METRIX BLOOD GLUCOSE TEST test strip   TRUEplus Lancets 28G MISC   vitamin B-12 (CYANOCOBALAMIN) 1000 MCG tablet   Vitamin D3 (VITAMIN D) 25 MCG tablet    Shonna Chock, PA-C Surgical Short Stay/Anesthesiology [facility] Phone [phone] [facility] Phone [phone] 04/12/2023 5:23 PM

## 2023-04-12 NOTE — Anesthesia Preprocedure Evaluation (Signed)
Anesthesia Evaluation  Patient identified by MRN, date of birth, ID band Patient awake    Reviewed: Allergy & Precautions, NPO status , Patient's Chart, lab work & pertinent test results, reviewed documented beta blocker date and time   History of Anesthesia Complications (+) PONV and history of anesthetic complications  Airway Mallampati: II  TM Distance: >3 FB Neck ROM: Limited    Dental  (+) Partial Lower, Upper Dentures, Dental Advisory Given   Pulmonary shortness of breath and with exertion, sleep apnea and Continuous Positive Airway Pressure Ventilation , pneumonia, resolved, COPD,  COPD inhaler, former smoker   Pulmonary exam normal breath sounds clear to auscultation       Cardiovascular hypertension, Pt. on medications + CAD, + Peripheral Vascular Disease and +CHF  Normal cardiovascular exam+ pacemaker  Rhythm:Regular Rate:Normal     Neuro/Psych  Headaches  Anxiety      Neuromuscular disease    GI/Hepatic ,GERD  Medicated,,  Endo/Other  diabetes, Well Controlled, Type 2Hypothyroidism  Gout Hyperlipidemia  Renal/GU ESRF and DialysisRenal diseaseLast dialysis yesterday  negative genitourinary   Musculoskeletal  (+) Arthritis , Osteoarthritis,    Abdominal  (+) + obese  Peds  Hematology Eliquis therapy- last dose 12/7   Anesthesia Other Findings   Reproductive/Obstetrics                              Anesthesia Physical Anesthesia Plan  ASA: 4  Anesthesia Plan: MAC   Post-op Pain Management: Minimal or no pain anticipated   Induction: Intravenous  PONV Risk Score and Plan: Treatment may vary due to age or medical condition and Propofol infusion  Airway Management Planned: Natural Airway and Nasal Cannula  Additional Equipment: None  Intra-op Plan:   Post-operative Plan: Extubation in OR  Informed Consent: I have reviewed the patients History and Physical, chart, labs  and discussed the procedure including the risks, benefits and alternatives for the proposed anesthesia with the patient or authorized representative who has indicated his/her understanding and acceptance.     Dental advisory given  Plan Discussed with: CRNA and Anesthesiologist  Anesthesia Plan Comments: (PAT note written 04/12/2023 by Shonna Chock, PA-C.  )        Anesthesia Quick Evaluation

## 2023-04-13 ENCOUNTER — Encounter (HOSPITAL_COMMUNITY): Payer: Self-pay | Admitting: Vascular Surgery

## 2023-04-13 ENCOUNTER — Ambulatory Visit (HOSPITAL_COMMUNITY): Payer: Self-pay | Admitting: Physician Assistant

## 2023-04-13 ENCOUNTER — Encounter (HOSPITAL_COMMUNITY)
Admission: RE | Disposition: A | Discharge status: Home / Self Care | Payer: Self-pay | Source: Ambulatory Visit | Attending: Vascular Surgery

## 2023-04-13 ENCOUNTER — Other Ambulatory Visit: Payer: Self-pay

## 2023-04-13 ENCOUNTER — Ambulatory Visit (HOSPITAL_COMMUNITY)
Admission: RE | Admit: 2023-04-13 | Discharge: 2023-04-13 | Disposition: A | Discharge status: Home / Self Care | Payer: Medicare HMO | Source: Ambulatory Visit | Attending: Vascular Surgery | Admitting: Vascular Surgery

## 2023-04-13 ENCOUNTER — Ambulatory Visit (HOSPITAL_COMMUNITY): Payer: Medicare HMO | Admitting: Physician Assistant

## 2023-04-13 DIAGNOSIS — G4733 Obstructive sleep apnea (adult) (pediatric): Secondary | ICD-10-CM | POA: Insufficient documentation

## 2023-04-13 DIAGNOSIS — E1140 Type 2 diabetes mellitus with diabetic neuropathy, unspecified: Secondary | ICD-10-CM | POA: Insufficient documentation

## 2023-04-13 DIAGNOSIS — M109 Gout, unspecified: Secondary | ICD-10-CM | POA: Insufficient documentation

## 2023-04-13 DIAGNOSIS — R519 Headache, unspecified: Secondary | ICD-10-CM | POA: Diagnosis not present

## 2023-04-13 DIAGNOSIS — N186 End stage renal disease: Secondary | ICD-10-CM | POA: Insufficient documentation

## 2023-04-13 DIAGNOSIS — I081 Rheumatic disorders of both mitral and tricuspid valves: Secondary | ICD-10-CM | POA: Insufficient documentation

## 2023-04-13 DIAGNOSIS — I5032 Chronic diastolic (congestive) heart failure: Secondary | ICD-10-CM | POA: Diagnosis not present

## 2023-04-13 DIAGNOSIS — K219 Gastro-esophageal reflux disease without esophagitis: Secondary | ICD-10-CM | POA: Insufficient documentation

## 2023-04-13 DIAGNOSIS — E785 Hyperlipidemia, unspecified: Secondary | ICD-10-CM | POA: Diagnosis not present

## 2023-04-13 DIAGNOSIS — M1990 Unspecified osteoarthritis, unspecified site: Secondary | ICD-10-CM | POA: Insufficient documentation

## 2023-04-13 DIAGNOSIS — Z950 Presence of cardiac pacemaker: Secondary | ICD-10-CM | POA: Insufficient documentation

## 2023-04-13 DIAGNOSIS — F419 Anxiety disorder, unspecified: Secondary | ICD-10-CM | POA: Diagnosis not present

## 2023-04-13 DIAGNOSIS — J449 Chronic obstructive pulmonary disease, unspecified: Secondary | ICD-10-CM | POA: Diagnosis not present

## 2023-04-13 DIAGNOSIS — Z833 Family history of diabetes mellitus: Secondary | ICD-10-CM | POA: Insufficient documentation

## 2023-04-13 DIAGNOSIS — Z6831 Body mass index (BMI) 31.0-31.9, adult: Secondary | ICD-10-CM | POA: Insufficient documentation

## 2023-04-13 DIAGNOSIS — I495 Sick sinus syndrome: Secondary | ICD-10-CM | POA: Insufficient documentation

## 2023-04-13 DIAGNOSIS — Z992 Dependence on renal dialysis: Secondary | ICD-10-CM | POA: Insufficient documentation

## 2023-04-13 DIAGNOSIS — E1122 Type 2 diabetes mellitus with diabetic chronic kidney disease: Secondary | ICD-10-CM | POA: Insufficient documentation

## 2023-04-13 DIAGNOSIS — I509 Heart failure, unspecified: Secondary | ICD-10-CM

## 2023-04-13 DIAGNOSIS — R0602 Shortness of breath: Secondary | ICD-10-CM | POA: Insufficient documentation

## 2023-04-13 DIAGNOSIS — N185 Chronic kidney disease, stage 5: Secondary | ICD-10-CM

## 2023-04-13 DIAGNOSIS — E669 Obesity, unspecified: Secondary | ICD-10-CM | POA: Insufficient documentation

## 2023-04-13 DIAGNOSIS — Z87891 Personal history of nicotine dependence: Secondary | ICD-10-CM | POA: Insufficient documentation

## 2023-04-13 DIAGNOSIS — I132 Hypertensive heart and chronic kidney disease with heart failure and with stage 5 chronic kidney disease, or end stage renal disease: Secondary | ICD-10-CM | POA: Diagnosis not present

## 2023-04-13 DIAGNOSIS — E890 Postprocedural hypothyroidism: Secondary | ICD-10-CM | POA: Diagnosis not present

## 2023-04-13 DIAGNOSIS — E1151 Type 2 diabetes mellitus with diabetic peripheral angiopathy without gangrene: Secondary | ICD-10-CM | POA: Insufficient documentation

## 2023-04-13 DIAGNOSIS — I2510 Atherosclerotic heart disease of native coronary artery without angina pectoris: Secondary | ICD-10-CM | POA: Diagnosis not present

## 2023-04-13 DIAGNOSIS — I480 Paroxysmal atrial fibrillation: Secondary | ICD-10-CM | POA: Diagnosis not present

## 2023-04-13 DIAGNOSIS — I700 Atherosclerosis of aorta: Secondary | ICD-10-CM | POA: Insufficient documentation

## 2023-04-13 HISTORY — PX: AV FISTULA PLACEMENT: SHX1204

## 2023-04-13 LAB — GLUCOSE, CAPILLARY
Glucose-Capillary: 163 mg/dL — ABNORMAL HIGH (ref 70–99)
Glucose-Capillary: 183 mg/dL — ABNORMAL HIGH (ref 70–99)
Glucose-Capillary: 160 mg/dL — ABNORMAL HIGH (ref 70–99)

## 2023-04-13 LAB — POCT I-STAT, CHEM 8
BUN: 40 mg/dL — ABNORMAL HIGH (ref 8–23)
Calcium, Ion: 1.07 mmol/L — ABNORMAL LOW (ref 1.15–1.40)
Chloride: 100 mmol/L (ref 98–111)
Creatinine, Ser: 2.4 mg/dL — ABNORMAL HIGH (ref 0.44–1.00)
Glucose, Bld: 191 mg/dL — ABNORMAL HIGH (ref 70–99)
HCT: 38 % (ref 36.0–46.0)
Hemoglobin: 12.9 g/dL (ref 12.0–15.0)
Potassium: 5 mmol/L (ref 3.5–5.1)
Sodium: 139 mmol/L (ref 135–145)
TCO2: 34 mmol/L — ABNORMAL HIGH (ref 22–32)

## 2023-04-13 LAB — SURGICAL PCR SCREEN
MRSA, PCR: POSITIVE — AB
Staphylococcus aureus: POSITIVE — AB

## 2023-04-13 SURGERY — ARTERIOVENOUS (AV) FISTULA CREATION
Anesthesia: Monitor Anesthesia Care | Laterality: Left

## 2023-04-13 MED ORDER — HEPARIN 6000 UNIT IRRIGATION SOLUTION
Status: DC | PRN
  Administered 2023-04-13: 1

## 2023-04-13 MED ORDER — FENTANYL CITRATE (PF) 100 MCG/2ML IJ SOLN
INTRAMUSCULAR | Status: AC
  Administered 2023-04-13: 50 ug via INTRAVENOUS
  Filled 2023-04-13: qty 2

## 2023-04-13 MED ORDER — SODIUM CHLORIDE 0.9 % IV SOLN: INTRAVENOUS | Status: DC | PRN

## 2023-04-13 MED ORDER — EPHEDRINE SULFATE-NACL 50-0.9 MG/10ML-% IV SOSY
PREFILLED_SYRINGE | INTRAVENOUS | Status: DC | PRN
  Administered 2023-04-13 (×3): 5 mg via INTRAVENOUS

## 2023-04-13 MED ORDER — HEPARIN 6000 UNIT IRRIGATION SOLUTION
Status: AC
  Filled 2023-04-13: qty 500

## 2023-04-13 MED ORDER — PHENYLEPHRINE HCL (PRESSORS) 10 MG/ML IV SOLN
INTRAVENOUS | Status: DC | PRN
  Administered 2023-04-13: 80 ug via INTRAVENOUS

## 2023-04-13 MED ORDER — EPHEDRINE 5 MG/ML INJ
INTRAVENOUS | Status: AC
  Filled 2023-04-13: qty 5

## 2023-04-13 MED ORDER — CHLORHEXIDINE GLUCONATE 4 % EX SOLN: 60.0000 mL | Freq: Once | CUTANEOUS | Status: DC

## 2023-04-13 MED ORDER — LIDOCAINE 2% (20 MG/ML) 5 ML SYRINGE
INTRAMUSCULAR | Status: DC | PRN
  Administered 2023-04-13: 40 mg via INTRAVENOUS

## 2023-04-13 MED ORDER — SODIUM CHLORIDE 0.9% FLUSH
10.0000 mL | Freq: Two times a day (BID) | INTRAVENOUS | Status: DC
  Administered 2023-04-13: 10 mL via INTRAVENOUS

## 2023-04-13 MED ORDER — SODIUM CHLORIDE 0.9% FLUSH: 10.0000 mL | Freq: Two times a day (BID) | INTRAVENOUS | Status: DC

## 2023-04-13 MED ORDER — ONDANSETRON HCL 4 MG/2ML IJ SOLN
INTRAMUSCULAR | Status: DC | PRN
  Administered 2023-04-13: 4 mg via INTRAVENOUS

## 2023-04-13 MED ORDER — HEPARIN SODIUM (PORCINE) 1000 UNIT/ML IJ SOLN
INTRAMUSCULAR | Status: DC | PRN
  Administered 2023-04-13: 2000 [IU] via INTRAVENOUS

## 2023-04-13 MED ORDER — PROPOFOL 10 MG/ML IV BOLUS
INTRAVENOUS | Status: AC
  Filled 2023-04-13: qty 20

## 2023-04-13 MED ORDER — CEFAZOLIN SODIUM-DEXTROSE 2-4 GM/100ML-% IV SOLN
2.0000 g | INTRAVENOUS | Status: AC
  Administered 2023-04-13: 2 g via INTRAVENOUS
  Filled 2023-04-13: qty 100

## 2023-04-13 MED ORDER — CHLORHEXIDINE GLUCONATE 0.12 % MT SOLN
15.0000 mL | Freq: Once | OROMUCOSAL | Status: AC
  Administered 2023-04-13: 15 mL via OROMUCOSAL
  Filled 2023-04-13: qty 15

## 2023-04-13 MED ORDER — PROPOFOL 500 MG/50ML IV EMUL
INTRAVENOUS | Status: DC | PRN
  Administered 2023-04-13: 25 mg via INTRAVENOUS
  Administered 2023-04-13: 85 ug/kg/min via INTRAVENOUS

## 2023-04-13 MED ORDER — PHENYLEPHRINE 80 MCG/ML (10ML) SYRINGE FOR IV PUSH (FOR BLOOD PRESSURE SUPPORT)
PREFILLED_SYRINGE | INTRAVENOUS | Status: AC
  Filled 2023-04-13: qty 10

## 2023-04-13 MED ORDER — FENTANYL CITRATE (PF) 100 MCG/2ML IJ SOLN: 50.0000 ug | Freq: Once | INTRAMUSCULAR | Status: AC

## 2023-04-13 MED ORDER — 0.9 % SODIUM CHLORIDE (POUR BTL) OPTIME
TOPICAL | Status: DC | PRN
  Administered 2023-04-13: 1000 mL

## 2023-04-13 MED ORDER — INSULIN ASPART 100 UNIT/ML IJ SOLN
0.0000 [IU] | INTRAMUSCULAR | Status: DC | PRN
  Administered 2023-04-13: 2 [IU] via SUBCUTANEOUS

## 2023-04-13 MED ORDER — ROPIVACAINE HCL 5 MG/ML IJ SOLN
INTRAMUSCULAR | Status: DC | PRN
  Administered 2023-04-13: 30 mL via PERINEURAL

## 2023-04-13 MED ORDER — ORAL CARE MOUTH RINSE: 15.0000 mL | Freq: Once | OROMUCOSAL | Status: AC

## 2023-04-13 SURGICAL SUPPLY — 37 items
APPLIER CLIP 9.375 MED OPEN (MISCELLANEOUS) ×1
APPLIER CLIP 9.375 SM OPEN (CLIP) ×1
ARMBAND PINK RESTRICT EXTREMIT (MISCELLANEOUS) ×1 IMPLANT
BAG COUNTER SPONGE SURGICOUNT (BAG) ×1 IMPLANT
BLADE CLIPPER SURG (BLADE) ×1 IMPLANT
BNDG ELASTIC 4X5.8 VLCR STR LF (GAUZE/BANDAGES/DRESSINGS) ×1 IMPLANT
CANISTER SUCT 3000ML PPV (MISCELLANEOUS) ×1 IMPLANT
CLIP APPLIE 9.375 MED OPEN (MISCELLANEOUS) ×1 IMPLANT
CLIP APPLIE 9.375 SM OPEN (CLIP) ×1 IMPLANT
CLIP TI MEDIUM 6 (CLIP) ×2 IMPLANT
CLIP TI WIDE RED SMALL 6 (CLIP) ×1 IMPLANT
COVER PROBE W GEL 5X96 (DRAPES) ×1 IMPLANT
DERMABOND ADVANCED .7 DNX12 (GAUZE/BANDAGES/DRESSINGS) ×1 IMPLANT
DRSG OPSITE POSTOP 3X4 (GAUZE/BANDAGES/DRESSINGS) IMPLANT
ELECT REM PT RETURN 9FT ADLT (ELECTROSURGICAL) ×1
ELECTRODE REM PT RTRN 9FT ADLT (ELECTROSURGICAL) ×1 IMPLANT
GLOVE BIOGEL PI IND STRL 8 (GLOVE) ×1 IMPLANT
GOWN STRL REUS W/ TWL LRG LVL3 (GOWN DISPOSABLE) ×2 IMPLANT
GOWN STRL REUS W/TWL 2XL LVL3 (GOWN DISPOSABLE) ×2 IMPLANT
KIT BASIN OR (CUSTOM PROCEDURE TRAY) ×1 IMPLANT
KIT TURNOVER KIT B (KITS) ×1 IMPLANT
NS IRRIG 1000ML POUR BTL (IV SOLUTION) ×1 IMPLANT
PACK CV ACCESS (CUSTOM PROCEDURE TRAY) ×1 IMPLANT
PAD ARMBOARD 7.5X6 YLW CONV (MISCELLANEOUS) ×2 IMPLANT
SLING ARM FOAM STRAP LRG (SOFTGOODS) IMPLANT
SLING ARM FOAM STRAP MED (SOFTGOODS) IMPLANT
SPIKE FLUID TRANSFER (MISCELLANEOUS) ×1 IMPLANT
STAPLER SKIN PROX WIDE 3.9 (STAPLE) IMPLANT
SUT MNCRL AB 4-0 PS2 18 (SUTURE) ×1 IMPLANT
SUT PROLENE 6 0 BV (SUTURE) ×1 IMPLANT
SUT PROLENE 7 0 BV 1 (SUTURE) IMPLANT
SUT SILK 2 0 SH (SUTURE) IMPLANT
SUT SILK 3 0 SH CR/8 (SUTURE) ×1 IMPLANT
SUT VIC AB 3-0 SH 27X BRD (SUTURE) ×1 IMPLANT
TOWEL GREEN STERILE (TOWEL DISPOSABLE) ×1 IMPLANT
UNDERPAD 30X36 HEAVY ABSORB (UNDERPADS AND DIAPERS) ×1 IMPLANT
WATER STERILE IRR 1000ML POUR (IV SOLUTION) ×1 IMPLANT

## 2023-04-13 NOTE — Transfer of Care (Signed)
Immediate Anesthesia Transfer of Care Note  Patient: Kayla [name]  Procedure(s) Performed: LEFT ARM ARTERIOVENOUS (AV) FISTULA CREATION (Left)  Patient Location: PACU  Anesthesia Type:MAC and Regional  Level of Consciousness: awake, alert , and patient cooperative  Airway & Oxygen Therapy: Patient Spontanous Breathing and Patient connected to nasal cannula oxygen  Post-op Assessment: Report given to RN and Post -op Vital signs reviewed and stable  Post vital signs: Reviewed and stable  Last Vitals:  Vitals Value Taken Time  BP 145/81 04/13/23 1112  Temp 37 C 04/13/23 1108  Pulse 80 04/13/23 1114  Resp 22 04/13/23 1114  SpO2 93 % 04/13/23 1114  Vitals shown include unfiled device data.  Last Pain:  Vitals:   04/13/23 1108  TempSrc:   PainSc: 0-No pain         Complications: No notable events documented.

## 2023-04-13 NOTE — Op Note (Signed)
    NAME: Kayla [name]    MRN: [id number] DOB: [birth year]    DATE OF OPERATION: 04/13/2023  PREOP DIAGNOSIS:    End stage renal disease  POSTOP DIAGNOSIS:    Same  PROCEDURE:    Left arm brachiocephalic fistula creation  SURGEON: Victorino Sparrow  ASSIST: Doreatha Massed, PA  ANESTHESIA: Moderate  EBL: 10ml  INDICATIONS:    Kayla [name] is a 83 y.o. female with history of end stage renal disease in need of long term HD access.   FINDINGS:   3.9mm cephalic vein 3.25mm brachial artery  TECHNIQUE:   The patient was brought to the operating room and placed in supine position. The left arm was prepped and draped in standard fashion. IV antibiotics were administered. A timeout was performed.   The case began with ultrasound insonation of the brachial artery and cephalic vein, which demonstrated sufficient size at the antecubital fossa for arteriovenous fistula.   A transverse incision was made below the elbow creese in the antecubital fossa. The  cephalic vein was isolated for 3 cm in length. Next the aponeurosis was partially released and the brachial artery secured with a vessel loop. The patient was heparinized. The cephalic vein was transected and ligated distally with a 2-0 silk stick-tie. The vein was dilated with coronary dilators and flushed with heparin saline. Vascular clamps were placed proximally and distally on the brachial artery and a 5 mm arteriotomy was created on the brachial artery. This was flushed with heparin saline.  An anastomosis was created in end to side fashion on the brachial artery using running 6-0 Prolene suture.  Prior to completing the anastomosis, the vessels were flushed and the suture line was tied down. There was an excellent thrill in the cephalic vein from the anastomosis to the mid upper bicipital region. The patient had a multiphasic radial and ulnar signal. He had an excellent doppler signal in the fistula. The incision was irrigated  and hemostasis achieved with cautery and suture. The deeper tissue was closed with 3-0 Vicryl and the skin closed with staples due to thin dermal layer that wound not support Monocryl suture.   Dermabond was applied to the incisions. The patient was transferred to PACU in stable condition.  Given the complexity of the case,  the assistant was necessary in order to expedient the procedure and safely perform the technical aspects of the operation.  The assistant provided traction and countertraction to assist with exposure of the artery and vein.  They also assisted with suture ligation of multiple venous branches.  They played a critical role in the anastomosis. These skills, especially following the Prolene suture for the anastomosis, could not have been adequately performed by a scrub tech assistant.    Victorino Sparrow, MD [facility] DATE OF DICTATION:   04/13/2023

## 2023-04-13 NOTE — Anesthesia Postprocedure Evaluation (Signed)
Anesthesia Post Note  Patient: Kayla [name]  Procedure(s) Performed: LEFT ARM ARTERIOVENOUS (AV) FISTULA CREATION (Left)     Patient location during evaluation: PACU Anesthesia Type: Regional and MAC Level of consciousness: awake and alert Pain management: pain level controlled Vital Signs Assessment: post-procedure vital signs reviewed and stable Respiratory status: spontaneous breathing, nonlabored ventilation and respiratory function stable Cardiovascular status: stable and blood pressure returned to baseline Postop Assessment: no apparent nausea or vomiting Anesthetic complications: no   No notable events documented.  Last Vitals:  Vitals:   04/13/23 1130 04/13/23 1135  BP: (!) 127/30 (!) 140/67  Pulse: 75 80  Resp: 17 18  Temp:  37 C  SpO2: 95% 94%    Last Pain:  Vitals:   04/13/23 1108  TempSrc:   PainSc: 0-No pain                 Zonnique Norkus A.

## 2023-04-13 NOTE — Anesthesia Procedure Notes (Signed)
Anesthesia Regional Block: Interscalene brachial plexus block   Pre-Anesthetic Checklist: , timeout performed,  Correct Patient, Correct Site, Correct Laterality,  Correct Procedure, Correct Position, site marked,  Risks and benefits discussed,  Surgical consent,  Pre-op evaluation,  At surgeon's request  Laterality: Left  Prep: chloraprep       Needles:  Injection technique: Single-shot  Needle Type: Echogenic Stimulator Needle     Needle Length: 10cm  Needle Gauge: 21   Needle insertion depth: 6 cm   Additional Needles:   Procedures:,,,, ultrasound used (permanent image in chart),,   Motor weakness within 5 minutes.  Narrative:  Start time: 04/13/2023 8:45 AM End time: 04/13/2023 8:50 AM Injection made incrementally with aspirations every 5 mL.  Performed by: Personally  Anesthesiologist: Mal Amabile, MD  Additional Notes: Timeout performed. Patient sedated. Relevant anatomy ID'd using [place]. Incremental 2-47ml injection of LA with frequent aspiration. Patient tolerated procedure well.

## 2023-04-13 NOTE — Discharge Instructions (Signed)
   [facility]  Discharge Instructions  AV Fistula or Graft Surgery for Dialysis Access  Please refer to the following instructions for your post-procedure care. Your surgeon or physician assistant will discuss any changes with you.  Activity  You may drive the day following your surgery, if you are comfortable and no longer taking prescription pain medication. Resume full activity as the soreness in your incision resolves.  Bathing/Showering  You may shower after you go home. Keep your incision dry for 48 hours. Do not soak in a bathtub, hot tub, or swim until the incision heals completely. You may not shower if you have a hemodialysis catheter.  Incision Care  Clean your incision with mild soap and water after 48 hours. Pat the area dry with a clean towel. You do not need a bandage unless otherwise instructed. Do not apply any ointments or creams to your incision. You may have skin glue on your incision. Do not peel it off. It will come off on its own in about one week. Your arm may swell a bit after surgery. To reduce swelling use pillows to elevate your arm so it is above your heart. Your doctor will tell you if you need to lightly wrap your arm with an ACE bandage.  Diet  Resume your normal diet. There are not special food restrictions following this procedure. In order to heal from your surgery, it is CRITICAL to get adequate nutrition. Your body requires vitamins, minerals, and protein. Vegetables are the best source of vitamins and minerals. Vegetables also provide the perfect balance of protein. Processed food has little nutritional value, so try to avoid this.  Medications  Resume taking all of your medications. If your incision is causing pain, you may take over-the counter pain relievers such as acetaminophen (Tylenol). If you were prescribed a stronger pain medication, please be aware these medications can cause nausea and constipation. Prevent  nausea by taking the medication with a snack or meal. Avoid constipation by drinking plenty of fluids and eating foods with high amount of fiber, such as fruits, vegetables, and grains.  Do not take Tylenol if you are taking prescription pain medications.  Follow up Your surgeon may want to see you in the office following your access surgery. If so, this will be arranged at the time of your surgery.  Please call us immediately for any of the following conditions:  Increased pain, redness, drainage (pus) from your incision site Fever of 101 degrees or higher Severe or worsening pain at your incision site Hand pain or numbness.  Reduce your risk of vascular disease:  Stop smoking. If you would like help, call [facility] at 1-800-QUIT-NOW (806-275-3087) or [place] at 308-850-2102  Manage your cholesterol Maintain a desired weight Control your diabetes Keep your blood pressure down  Dialysis  It will take several weeks to several months for your new dialysis access to be ready for use. Your surgeon will determine when it is okay to use it. Your nephrologist will continue to direct your dialysis. You can continue to use your Permcath until your new access is ready for use.   04/13/2023 Kayla [name] [id number] March 29, 1941  Surgeon(s): Victorino Sparrow, MD  Procedure(s): Creation left brachiocephalic AV fistula  x Do not stick fistula for 12 weeks    If you have any questions, please call the office at [phone].

## 2023-04-13 NOTE — H&P (Signed)
Office Note   Patient seen and examined in preop holding.  No complaints. No changes to medication history or physical exam since last seen in clinic. After discussing the risks and benefits of left arm fistula creation, Kayla [name] elected to proceed.   Victorino Sparrow MD   CC:  ESRD Requesting Provider:  No ref. provider found  HPI: Kayla [name] is a Right handed 82 y.o. (03-Aug-1940) female with kidney disease who presents at the request of No ref. provider found for permanent HD access. The patient has had no prior access procedures. Per pt, previous tunneled lines have been placed in the right IJ. Current access is right IJ.  Dialysis days are M/F.   On exam, Kayla [name] was doing well, accompanied by her husband.  Originally from [place], she moved to [place], where her husband is from.  They have been married for 64 years and went to high school together.  Over the last several years Kayla [name] health has declined significantly.  She is now end-stage renal disease and requires 3 L nasal cannula for COPD. Kayla [name] has bilateral upper extremity neuropathy from longstanding diabetes.   Past Medical History:  Diagnosis Date   Allergy    Anxiety    Arthritis    CAD (coronary artery disease)    Mild per remote cath in 2006, /    Nuclear, January, 2013, no significant abnormality,   Carotid artery disease (HCC)    Doppler, January, 2013, 0 39% RIC A., 40-59% LICA, stable   Cataract    CHF (congestive heart failure) (HCC)    Chronic kidney disease    Complication of anesthesia    wakes up "mean"   COPD 02/16/2007   Intolerant of Spiriva, tudorza Intolerant of Trelegy    COPD (chronic obstructive pulmonary disease) (HCC)    Coronary atherosclerosis 11/24/2008   Catheterization 2006, nonobstructive coronary disease   //   nuclear 2013, low risk     Diabetes mellitus, type 2 (HCC)    Diverticula of colon    DIVERTICULOSIS OF COLON 03/23/2007   Qualifier: Diagnosis of  By:  Craige Cotta MD, Vineet     Dizziness    occasional   Dizzy spells 05/04/2011   DM (diabetes mellitus) (HCC) 03/23/2007   Qualifier: Diagnosis of  By: Craige Cotta MD, Vineet     Dyspnea    On exertion   Ejection fraction    Essential hypertension 02/16/2007   Qualifier: Diagnosis of  By: Earlene Plater CMA, Tammy     G E R D 03/23/2007   Qualifier: Diagnosis of  By: Craige Cotta MD, Vineet     Gall bladder disease 04/28/2016   Gastritis and gastroduodenitis    GERD (gastroesophageal reflux disease)    Goiter    hx of   Headache(784.0)    migraine   Hyperlipidemia    Hypertension    Hypothyroidism    HYPOTHYROIDISM, POSTSURGICAL 06/04/2008   Qualifier: Diagnosis of  By: Everardo All MD, Sean A    Left ventricular hypertrophy    Leg edema    occasional swelling   Leg swelling 12/09/2018   Myofascial pain syndrome, cervical 01/06/2018   Obesity    OBESITY 11/24/2008   Qualifier: Diagnosis of  By: Vikki Ports     OBSTRUCTIVE SLEEP APNEA 02/16/2007   Auto CPAP 09/03/13 to 10/02/13 >> used on 30 of 30 nights with average 6 hrs and 3 min.  Average AHI is 3.1 with median CPAP 5 cm H2O and 95 th percentile CPAP  6 cm H2O.     Occipital neuralgia of right side 01/06/2018   OSA (obstructive sleep apnea)    cpap setting of 12   Pain in joint of right hip 01/21/2018   Palpitations 01/21/2011   48 hour Holter, 2015, scattered PACs and PVCs.    Paroxysmal atrial fibrillation (HCC)    Pneumonia    PONV (postoperative nausea and vomiting)    severe after every surgery   Pulmonary hypertension, secondary    RHINITIS 07/21/2010        RUQ abdominal pain    S/P left TKA 07/27/2011   Sinus node dysfunction (HCC) 11/08/2015   Sleep apnea    Spinal stenosis of cervical region 11/07/2014   Urinary frequency 12/09/2018   UTI (urinary tract infection) 08/15/2019   per pt report   VENTRICULAR HYPERTROPHY, LEFT 11/24/2008   Qualifier: Diagnosis of  By: Vikki Ports      Past Surgical History:  Procedure Laterality  Date   ABDOMINAL HYSTERECTOMY     with appendectomy and colon polypectomy   AMPUTATION TOE Left 12/15/2019   Procedure: AMPUTATION  LEFT GREAT TOE;  Surgeon: Park Liter, DPM;  Location: [facility];  Service: Podiatry;  Laterality: Left;   AMPUTATION TOE Left 01/22/2022   Procedure: AMPUTATION TOE 4th digit;  Surgeon: Candelaria Stagers, DPM;  Location: [facility];  Service: Podiatry;  Laterality: Left;   AMPUTATION TOE Left 09/04/2022   Procedure: LEFT BIG TOE AMPUTAION;  Surgeon: Vivi Barrack, DPM;  Location: [facility];  Service: Podiatry;  Laterality: Left;   ANTERIOR CERVICAL DECOMP/DISCECTOMY FUSION N/A 11/07/2014   Procedure: Anterior Cervical diskectomy and fusion Cervical four-five, Cervical five-six, Cervical six-seven;  Surgeon: Donalee Citrin, MD;  Location: [facility];  Service: Neurosurgery;  Laterality: N/A;   APPENDECTOMY     ARTERY BIOPSY Right 02/16/2017   Procedure: BIOPSY TEMPORAL ARTERY;  Surgeon: Renford Dills, MD;  Location: [facility];  Service: Vascular;  Laterality: Right;   ARTERY BIOPSY Left 04/16/2020   Procedure: BIOPSY TEMPORAL ARTERY;  Surgeon: Renford Dills, MD;  Location: [facility] OR[facility];  Service: Vascular;  Laterality: Left;  LEFT TEMPLE   BACK SURGERY     CARDIAC CATHETERIZATION     CATARACT EXTRACTION W/ INTRAOCULAR LENS  IMPLANT, BILATERAL Bilateral    CHOLECYSTECTOMY N/A 04/29/2016   Procedure: LAPAROSCOPIC CHOLECYSTECTOMY WITH INTRAOPERATIVE CHOLANGIOGRAM;  Surgeon: Ovidio Kin, MD;  Location: [facility];  Service: General;  Laterality: N/A;   EP IMPLANTABLE DEVICE N/A 11/08/2015   MRI COMPATABLE -- Procedure: Pacemaker Implant;  Surgeon: Duke Salvia, MD;  Location: [facility];  Service: Cardiovascular;  Laterality: N/A;   INCISION AND DRAINAGE ABSCESS Right 05/30/2021   Procedure: IRRIGATION AND DEBRIDEMENT OF PREPATELLA BURSA;  Surgeon: Durene Romans, MD;  Location: [facility];  Service: Orthopedics;  Laterality: Right;  60 wants standard knee draping and pulse  lavage   INCISION AND DRAINAGE ABSCESS Right 07/22/2021   Procedure: INCISION AND DRAINAGE ABSCESS RIGHT KNEE;  Surgeon: Durene Romans, MD;  Location: [facility];  Service: Orthopedics;  Laterality: Right;   INSERT / REPLACE / REMOVE PACEMAKER  2017   Dr. Clide Cliff [facility])   IR FLUORO GUIDE CV LINE RIGHT  07/25/2021   IR FLUORO GUIDE CV LINE RIGHT  11/04/2022   IR REMOVAL TUN CV CATH W/O FL  09/04/2021   IR US GUIDE VASC ACCESS RIGHT  07/25/2021   IR US GUIDE VASC ACCESS RIGHT  11/04/2022   JOINT REPLACEMENT  LEAD EXTRACTION N/A 09/08/2022   Procedure: LEAD EXTRACTION;  Surgeon: Marinus Maw, MD;  Location: [facility] INV[facility] CV LAB;  Service: Cardiovascular;  Laterality: N/A;   LEAD EXTRACTION N/A 09/11/2022   Procedure: LEAD EXTRACTION;  Surgeon: Marinus Maw, MD;  Location: [facility];  Service: Cardiovascular;  Laterality: N/A;   LUMBAR DISC SURGERY  1990's   x 2   TEE WITHOUT CARDIOVERSION N/A 09/07/2022   Procedure: TRANSESOPHAGEAL ECHOCARDIOGRAM;  Surgeon: Christell Constant, MD;  Location: [facility];  Service: Cardiovascular;  Laterality: N/A;   THYROIDECTOMY  2011   TOTAL KNEE ARTHROPLASTY  07/27/2011   Procedure: TOTAL KNEE ARTHROPLASTY;  Surgeon: Shelda Pal, MD;  Location: [facility];  Service: Orthopedics;  Laterality: Left;   UPPER ESOPHAGEAL ENDOSCOPIC ULTRASOUND (EUS) N/A 05/14/2016   Procedure: UPPER ESOPHAGEAL ENDOSCOPIC ULTRASOUND (EUS);  Surgeon: Rachael Fee, MD;  Location: [facility];  Service: Endoscopy;  Laterality: N/A;  radial only-will be done mod if needed    Social History   Socioeconomic History   Marital status: Married    Spouse name: Richardean Aydin   Number of children: 3   Years of education: 12   Highest education level: Not on file  Occupational History   Occupation: retired  Tobacco Use   Smoking status: Former    Current packs/day: 0.00    Average packs/day: 2.0 packs/day for 33.0 years (66.0 ttl pk-yrs)    Types: Cigarettes     Start date: 1966    Quit date: 05/04/1997    Years since quitting: 25.9   Smokeless tobacco: Never  Vaping Use   Vaping status: Never Used  Substance and Sexual Activity   Alcohol use: No   Drug use: No   Sexual activity: Yes  Other Topics Concern   Not on file  Social History Narrative   Patient lives at home with her husband Fayrene Fearing .   Retired.   Education 12 th grade.   Right handed   Caffeine two cups of coffee.   Social Determinants of Health   Financial Resource Strain: Low Risk  (01/31/2021)   Overall Financial Resource Strain (CARDIA)    Difficulty of Paying Living Expenses: Not very hard  Food Insecurity: No Food Insecurity (11/04/2022)   Hunger Vital Sign    Worried About Running Out of Food in the Last Year: Never true    Ran Out of Food in the Last Year: Never true  Transportation Needs: No Transportation Needs (11/04/2022)   PRAPARE - Administrator, Civil Service (Medical): No    Lack of Transportation (Non-Medical): No  Physical Activity: Insufficiently Active (09/30/2021)   Exercise Vital Sign    Days of Exercise per Week: 5 days    Minutes of Exercise per Session: 20 min  Stress: No Stress Concern Present (09/30/2021)   [employer] of Occupational Health - Occupational Stress Questionnaire    Feeling of Stress : Not at all  Social Connections: Socially Integrated (09/30/2021)   Social Connection and Isolation Panel [NHANES]    Frequency of Communication with Friends and Family: More than three times a week    Frequency of Social Gatherings with Friends and Family: Twice a week    Attends Religious Services: 1 to 4 times per year    Active Member of [employer] or Organizations: Yes    Attends Banker Meetings: 1 to 4 times per year    Marital Status: Married  Catering manager Violence:  Not At Risk (11/04/2022)   Humiliation, Afraid, Rape, and Kick questionnaire    Fear of Current or Ex-Partner: No    Emotionally Abused: No    Physically  Abused: No    Sexually Abused: No   Family History  Problem Relation Age of Onset   Heart Problems Mother    Heart defect Mother        valve problems   Heart Problems Father    Heart disease Brother    Lung cancer Daughter        non small cell    Diabetes Maternal Aunt    Diabetes Maternal Uncle    Throat cancer Brother    Colon cancer Neg Hx    Colon polyps Neg Hx    Esophageal cancer Neg Hx    Rectal cancer Neg Hx    Stomach cancer Neg Hx     Current Facility-Administered Medications  Medication Dose Route Frequency Provider Last Rate Last Admin   ceFAZolin (ANCEF) IVPB 2g/100 mL premix  2 g Intravenous 30 min Pre-Op Victorino Sparrow, MD       chlorhexidine (HIBICLENS) 4 % liquid 4 Application  60 mL Topical Once Victorino Sparrow, MD       And   [START ON 04/14/2023] chlorhexidine (HIBICLENS) 4 % liquid 4 Application  60 mL Topical Once Victorino Sparrow, MD       insulin aspart (novoLOG) injection 0-7 Units  0-7 Units Subcutaneous Q2H PRN Mal Amabile, MD       sodium chloride flush (NS) 0.9 % injection 10 mL  10 mL Intravenous Q12H Victorino Sparrow, MD       sodium chloride flush (NS) 0.9 % injection 10 mL  10 mL Intravenous Q12H Mal Amabile, MD   10 mL at 04/13/23 0803   Facility-Administered Medications Ordered in Other Encounters  Medication Dose Route Frequency Provider Last Rate Last Admin   ropivacaine (PF) 5 mg/mL (0.5%) (NAROPIN) injection   Peri-NEURAL Anesthesia Intra-op Mal Amabile, MD   30 mL at 04/13/23 0856    Allergies  Allergen Reactions   Oxycodone Itching and Other (See Comments)    Pt still takes this med even thought it causes itching   Trelegy Ellipta [Fluticasone-Umeclidin-Vilant] Nausea And Vomiting, Other (See Comments) and Cough    Made the patient sick to her stomach (only) several days after using it    Trulicity [Dulaglutide] Nausea And Vomiting, Nausea Only, Other (See Comments) and Cough    TRULICITY made the patient sick to  her stomach (only) several days after using it     REVIEW OF SYSTEMS:  [X]  denotes positive finding, [ ]  denotes negative finding Cardiac  Comments:  Chest pain or chest pressure:    Shortness of breath upon exertion:    Short of breath when lying flat:    Irregular heart rhythm:        Vascular    Pain in calf, thigh, or hip brought on by ambulation:    Pain in feet at night that wakes you up from your sleep:     Blood clot in your veins:    Leg swelling:         Pulmonary    Oxygen at home:    Productive cough:     Wheezing:         Neurologic    Sudden weakness in arms or legs:     Sudden numbness in arms or legs:     Sudden  onset of difficulty speaking or slurred speech:    Temporary loss of vision in one eye:     Problems with dizziness:         Gastrointestinal    Blood in stool:     Vomited blood:         Genitourinary    Burning when urinating:     Blood in urine:        Psychiatric    Major depression:         Hematologic    Bleeding problems:    Problems with blood clotting too easily:        Skin    Rashes or ulcers:        Constitutional    Fever or chills:      PHYSICAL EXAMINATION:  Vitals:   04/13/23 0840 04/13/23 0845 04/13/23 0850 04/13/23 0855  BP: (!) 140/41 (!) 126/46 (!) 141/44 (!) 117/46  Pulse:  (!) 55 (!) 58 (!) 57  Resp: 20 18 13  (!) 21  Temp:      TempSrc:      SpO2:  98% 98% 94%  Weight:      Height:        General:  WDWN in NAD; vital signs documented above Gait: Not observed HENT: WNL, normocephalic Pulmonary: normal non-labored breathing  3L Remer Abdomen: soft, NT, no masses Skin: without rashes Vascular Exam/Pulses:  Right Left  Radial 2+ (normal) 2+ (normal)                       Extremities: without ischemic changes, without Gangrene , without cellulitis; without open wounds;  Musculoskeletal: no muscle wasting or atrophy  Neurologic: A&O X 3;  No focal weakness or paresthesias are detected Psychiatric:   The pt has Normal affect.   Non-Invasive Vascular Imaging:   See study    ASSESSMENT/PLAN:  Kayla [name] is a 82 y.o. female who presents with end stage renal disease  Based on vein mapping and examination, Eisha is a candidate for bilateral upper extremity fistula creation.  Being that she is right-handed, we discussed left arm brachiocephalic fistula creation.. I had an extensive discussion with this patient in regards to the nature of access surgery, including risk, benefits, and alternatives.   The patient is aware that the risks of access surgery include but are not limited to: bleeding, infection, steal syndrome, nerve damage, ischemic monomelic neuropathy, failure of access to mature, complications related to venous hypertension, and possible need for additional access procedures in the future.  I discussed with the patient the nature of the staged access procedure, specifically the need for a second operation to transpose the first stage fistula if it matures adequately.   The patient has  agreed to proceed with the above procedure which will be scheduled at her convenience.  Victorino Sparrow, MD [facility] [phone]

## 2023-04-14 ENCOUNTER — Encounter (HOSPITAL_COMMUNITY): Payer: Self-pay | Admitting: Vascular Surgery

## 2023-04-16 DIAGNOSIS — N186 End stage renal disease: Secondary | ICD-10-CM | POA: Diagnosis not present

## 2023-04-16 DIAGNOSIS — N2581 Secondary hyperparathyroidism of renal origin: Secondary | ICD-10-CM | POA: Diagnosis not present

## 2023-04-16 DIAGNOSIS — Z992 Dependence on renal dialysis: Secondary | ICD-10-CM | POA: Diagnosis not present

## 2023-04-19 DIAGNOSIS — N2581 Secondary hyperparathyroidism of renal origin: Secondary | ICD-10-CM | POA: Diagnosis not present

## 2023-04-19 DIAGNOSIS — N186 End stage renal disease: Secondary | ICD-10-CM | POA: Diagnosis not present

## 2023-04-19 DIAGNOSIS — Z992 Dependence on renal dialysis: Secondary | ICD-10-CM | POA: Diagnosis not present

## 2023-04-22 ENCOUNTER — Telehealth: Payer: Self-pay

## 2023-04-22 ENCOUNTER — Ambulatory Visit: Payer: Medicare HMO | Admitting: Physician Assistant

## 2023-04-22 VITALS — BP 156/50 | HR 73 | Temp 98.2°F | Resp 20 | Ht 65.0 in | Wt 196.2 lb

## 2023-04-22 DIAGNOSIS — Z992 Dependence on renal dialysis: Secondary | ICD-10-CM

## 2023-04-22 DIAGNOSIS — N186 End stage renal disease: Secondary | ICD-10-CM

## 2023-04-22 NOTE — Progress Notes (Signed)
POST OPERATIVE OFFICE NOTE    CC:  F/u for surgery  HPI:  Kayla [name] is a 82 y.o. female who presents today as a triage visit.  She recently underwent left brachiocephalic AV fistula creation on 04/13/2023 by Dr. Karin Lieu.  She is here today as a triage visit.  She states since waking up from surgery she has had sharp pain in the left side of her neck/shoulder that radiates down her arm and into her hand.  She states the pain has been an 8 or 9 out of 10 when her pain medication wears off.  Her pain calms down when she rests her arm.  Aggravating factors includes moving her arm, squeezing her hand, or pressure to the arm.  She was concerned about blood flow to the hand because she had some left hand numbness this morning.  Her left hand numbness has resolved.  She has no left hand weakness or coldness.   She denies any fevers or chills.   Allergies  Allergen Reactions   Oxycodone Itching and Other (See Comments)    Pt still takes this med even thought it causes itching   Trelegy Ellipta [Fluticasone-Umeclidin-Vilant] Nausea And Vomiting, Other (See Comments) and Cough    Made the patient sick to her stomach (only) several days after using it    Trulicity [Dulaglutide] Nausea And Vomiting, Nausea Only, Other (See Comments) and Cough    TRULICITY made the patient sick to her stomach (only) several days after using it    Current Outpatient Medications  Medication Sig Dispense Refill   acetaminophen (TYLENOL) 500 MG tablet Take 1,000-1,500 mg by mouth every 8 (eight) hours as needed for mild pain, moderate pain or headache.     albuterol (PROVENTIL) (2.5 MG/3ML) 0.083% nebulizer solution Take 3 mLs (2.5 mg total) by nebulization every 2 (two) hours as needed for wheezing or shortness of breath. 75 mL 5   albuterol (VENTOLIN HFA) 108 (90 Base) MCG/ACT inhaler Inhale 2 puffs into the lungs every 6 (six) hours as needed for wheezing or shortness of breath. 3 each 3   Alcohol Swabs (B-D  SINGLE USE SWABS REGULAR) PADS      allopurinol (ZYLOPRIM) 100 MG tablet Take 100 mg by mouth 2 (two) times daily.     amLODipine (NORVASC) 5 MG tablet Take 2 tablets (10 mg total) by mouth daily. (Patient taking differently: Take 5 mg by mouth daily.)     ascorbic acid (VITAMIN C) 500 MG tablet Take 500 mg by mouth daily with lunch.     atorvastatin (LIPITOR) 40 MG tablet TAKE 1 TABLET EVERY DAY 90 tablet 0   azithromycin (ZITHROMAX Z-PAK) 250 MG tablet Take 2 tablets day 1 and then 1 daily for 4 days 6 each 0   Blood Glucose Calibration (TRUE METRIX LEVEL 1) Low SOLN      Budeson-Glycopyrrol-Formoterol (BREZTRI AEROSPHERE) 160-9-4.8 MCG/ACT AERO Inhale 2 puffs into the lungs in the morning and at bedtime. 3 each 10   Carboxymethylcellulose Sodium (ARTIFICIAL TEARS OP) Place 1 drop into both eyes daily as needed (dry eyes).     Doxercalciferol (HECTOROL IV) Doxercalciferol (Hectorol)     ELIQUIS 2.5 MG TABS tablet TAKE 1 TABLET TWICE DAILY 180 tablet 3   escitalopram (LEXAPRO) 5 MG tablet Take 1 tablet (5 mg total) by mouth daily. 30 tablet 0   ferrous sulfate 325 (65 FE) MG tablet Take 1 tablet (325 mg total) by mouth daily with breakfast. 90 tablet 3   fluticasone (  FLONASE) 50 MCG/ACT nasal spray USE 2 SPRAYS NASALLY DAILY (Patient taking differently: Place 1 spray into both nostrils daily as needed for allergies.) 48 g 1   gabapentin (NEURONTIN) 800 MG tablet Take 800 mg by mouth 3 (three) times daily.     guaiFENesin (ROBITUSSIN) 100 MG/5ML liquid Take 5 mLs by mouth every 4 (four) hours as needed for cough or to loosen phlegm.     HYDROcodone-acetaminophen (NORCO) 10-325 MG tablet Take 1 tablet by mouth every 6 (six) hours as needed. (Patient taking differently: Take 1 tablet by mouth every 6 (six) hours as needed for severe pain (pain score 7-10).) 10 tablet 0   HYDROXYZINE HCL PO Take 25 mg by mouth at bedtime as needed (insomnia).     insulin aspart (NOVOLOG) 100 UNIT/ML FlexPen Inject  0-20 Units into the skin See admin instructions. Inject 0-20 units into the skin three times a day before meals "as needed," per SLIDING SCALE     isosorbide mononitrate (IMDUR) 60 MG 24 hr tablet Take 1 tablet (60 mg total) by mouth daily. 30 tablet 0   levothyroxine (SYNTHROID) 200 MCG tablet Take 200 mcg by mouth daily before breakfast.     loratadine (CLARITIN) 10 MG tablet Take 1 tablet (10 mg total) by mouth daily.     melatonin 5 MG TABS Take 1 tablet (5 mg total) by mouth at bedtime as needed.  0   Methoxy PEG-Epoetin Beta (MIRCERA IJ) Mircera     multivitamin (RENA-VIT) TABS tablet Take 1 tablet by mouth at bedtime. 30 tablet 0   nitroGLYCERIN (NITROSTAT) 0.4 MG SL tablet Place 1 tablet (0.4 mg total) under the tongue every 5 (five) minutes as needed for chest pain. 25 tablet 4   polyethylene glycol (MIRALAX / GLYCOLAX) 17 g packet Take 17 g by mouth daily as needed for mild constipation or moderate constipation.  0   predniSONE (DELTASONE) 20 MG tablet Take 1 tablet (20 mg total) by mouth daily with breakfast. 7 tablet 0   PRESCRIPTION MEDICATION CPAP- At bedtime and as needed during any time of rest     promethazine (PHENERGAN) 25 MG suppository SMARTSIG:0.5-1 SUPPOS Rectally Every 6 Hours PRN     sevelamer carbonate (RENVELA) 800 MG tablet Take 1 tablet (800 mg total) by mouth 3 (three) times daily with meals. 90 tablet 0   torsemide (DEMADEX) 100 MG tablet Take 1 tablet (100 mg total) by mouth 2 (two) times daily. 60 tablet 0   TRUE METRIX BLOOD GLUCOSE TEST test strip      TRUEplus Lancets 28G MISC      vitamin B-12 (CYANOCOBALAMIN) 1000 MCG tablet Take 1,000 mcg by mouth daily with lunch.     Vitamin D3 (VITAMIN D) 25 MCG tablet Take 1,000 Units by mouth daily with lunch.     No current facility-administered medications for this visit.     ROS:  See HPI  Physical Exam:  Incision: Honeycomb dressing removed from left arm incision.  Some staples came off with the bandaging.   The incision is healing appropriately with some mild staple irritation.  The remainder of the staples were kept in place.  There is no drainage or swelling of the incision Extremities: Left upper arm fistula with good thrill on palpation.  2+ left radial pulse Neuro: Intact motor and sensation of left hand.  Pain throughout the entire left arm when gripping the left hand.    Assessment/Plan:  This is a 82 y.o. female who is  here for a triage visit  -The patient recently underwent left brachiocephalic fistula creation on 04/13/2023.  She presents today as a triage visit for left arm pain -Since her surgery she has had severe, sharp pain in the left arm beginning at the lower neck/upper shoulder and radiating down the arm and into the hand.  This pain is acutely worsened by any form of arm or hand movement.  She denies any explicit hand weakness.  She did feel like her left hand was numb this morning, however now it is fine -On my exam she had not removed her honeycomb dressing overlying her incision since surgery.  I removed this bandage and some of her staples came off at the time.  The incision is healing appropriately with some erythema, likely due to staple irritation.  The incision is dry with no area of dehiscence -Her left arm fistula has a good thrill on palpation, however she is very sensitive to touch at this time -She has a 2+ left radial pulse.  She has intact grip strength of the left hand, however she does have pain when moving the hand.  She has intact sensation of the left hand.  Her left hand is warm. -Given that the patient has no sensory or motor deficits of the left hand and also a palpable pulse, I do not think her pain is due to steal syndrome.  Her pain sounds neurological nature and is potentially related to her nerve block for surgery.  Most of her pain is located in her upper arm and radiates into the hand.  She also does have cervical spine disease with a history of cervical  spine surgery. -I have encouraged her to rest the arm is much as possible.  I have offered to refer her to an outpatient anesthesia clinic for management or to her neurosurgeon, however she would like to wait at this time.  She can follow-up with our office next week to have the remainder of her staples removed   Loel Dubonnet, PA-C [facility] [phone]   Call MD: Hetty Blend

## 2023-04-22 NOTE — Telephone Encounter (Signed)
Returned pt's call; she has c/o LUE pain from shoulder to fingers, s/p L AVF 10 days ago. Pain with numbness just started in the last few days and is worsening. She is unable to grip or use her hand for much. She is able to move fingers but states she can't raise her arm. She has been added today to APP schedule.

## 2023-04-23 DIAGNOSIS — N186 End stage renal disease: Secondary | ICD-10-CM | POA: Diagnosis not present

## 2023-04-23 DIAGNOSIS — N2581 Secondary hyperparathyroidism of renal origin: Secondary | ICD-10-CM | POA: Diagnosis not present

## 2023-04-23 DIAGNOSIS — Z992 Dependence on renal dialysis: Secondary | ICD-10-CM | POA: Diagnosis not present

## 2023-04-25 DIAGNOSIS — Z992 Dependence on renal dialysis: Secondary | ICD-10-CM | POA: Diagnosis not present

## 2023-04-25 DIAGNOSIS — N2581 Secondary hyperparathyroidism of renal origin: Secondary | ICD-10-CM | POA: Diagnosis not present

## 2023-04-25 DIAGNOSIS — N186 End stage renal disease: Secondary | ICD-10-CM | POA: Diagnosis not present

## 2023-04-29 ENCOUNTER — Ambulatory Visit (INDEPENDENT_AMBULATORY_CARE_PROVIDER_SITE_OTHER): Payer: Medicare HMO | Admitting: Physician Assistant

## 2023-04-29 VITALS — BP 182/74 | HR 61 | Temp 97.2°F | Resp 18 | Ht 65.0 in | Wt 196.9 lb

## 2023-04-29 DIAGNOSIS — N186 End stage renal disease: Secondary | ICD-10-CM

## 2023-04-29 DIAGNOSIS — Z992 Dependence on renal dialysis: Secondary | ICD-10-CM

## 2023-04-29 NOTE — Progress Notes (Signed)
    Postoperative Access Visit   History of Present Illness   LENETTE TERLIZZI is a 82 y.o. year old female who presents for postoperative follow-up for: left brachiocephalic AV fistula creation on 04/13/2023 by Dr. Karin Lieu. The patient's wounds are almost completely healed.  The patient notes no steal symptoms. She was previously seen on 12/19 due to sharp pains in her left arm, neck and shoulder. This has since resolved. The patient is able to complete their activities of daily living.   She currently dialyzes on Mon/Fridays via right internal jugular TDC at the [facility] location  Physical Examination   Vitals:   04/29/23 0847  BP: (!) 182/74  Pulse: 61  Resp: 18  Temp: (!) 97.2 F (36.2 C)  SpO2: 97%  Weight: 196 lb 14.4 oz (89.3 kg)  Height: 5\' 5"  (1.651 m)   Body mass index is 32.77 kg/m.  left arm Incision is almost healed, remaining 4 staples removed. 2+ radial pulse, hand grip is 5/5, sensation in digits is intact, palpable thrill, bruit can be auscultated     Medical Decision Making   LOTTA AUKER is a 82 y.o. year old female who presents s/p left brachiocephalic AV fistula creation on 04/13/2023 by Dr. Karin Lieu. The patient's wounds are almost completely healed.  The patient notes no steal symptoms. She has follow up on 05/27/23 for Fistula duplex. She knows to call if she has any concerns prior to that visit.  Graceann Congress, PA-C [facility] Office: [phone]  Clinic MD: Karin Lieu

## 2023-04-30 DIAGNOSIS — N186 End stage renal disease: Secondary | ICD-10-CM | POA: Diagnosis not present

## 2023-04-30 DIAGNOSIS — Z992 Dependence on renal dialysis: Secondary | ICD-10-CM | POA: Diagnosis not present

## 2023-04-30 DIAGNOSIS — N2581 Secondary hyperparathyroidism of renal origin: Secondary | ICD-10-CM | POA: Diagnosis not present

## 2023-05-03 DIAGNOSIS — R2681 Unsteadiness on feet: Secondary | ICD-10-CM | POA: Diagnosis not present

## 2023-05-03 DIAGNOSIS — M549 Dorsalgia, unspecified: Secondary | ICD-10-CM | POA: Diagnosis not present

## 2023-05-03 DIAGNOSIS — N2581 Secondary hyperparathyroidism of renal origin: Secondary | ICD-10-CM | POA: Diagnosis not present

## 2023-05-03 DIAGNOSIS — E349 Endocrine disorder, unspecified: Secondary | ICD-10-CM | POA: Diagnosis not present

## 2023-05-03 DIAGNOSIS — M8580 Other specified disorders of bone density and structure, unspecified site: Secondary | ICD-10-CM | POA: Diagnosis not present

## 2023-05-03 DIAGNOSIS — M353 Polymyalgia rheumatica: Secondary | ICD-10-CM | POA: Diagnosis not present

## 2023-05-03 DIAGNOSIS — M25569 Pain in unspecified knee: Secondary | ICD-10-CM | POA: Diagnosis not present

## 2023-05-03 DIAGNOSIS — Z992 Dependence on renal dialysis: Secondary | ICD-10-CM | POA: Diagnosis not present

## 2023-05-03 DIAGNOSIS — Z79891 Long term (current) use of opiate analgesic: Secondary | ICD-10-CM | POA: Diagnosis not present

## 2023-05-03 DIAGNOSIS — N186 End stage renal disease: Secondary | ICD-10-CM | POA: Diagnosis not present

## 2023-05-03 DIAGNOSIS — Z1389 Encounter for screening for other disorder: Secondary | ICD-10-CM | POA: Diagnosis not present

## 2023-05-03 DIAGNOSIS — M1710 Unilateral primary osteoarthritis, unspecified knee: Secondary | ICD-10-CM | POA: Diagnosis not present

## 2023-05-04 DIAGNOSIS — Z992 Dependence on renal dialysis: Secondary | ICD-10-CM | POA: Diagnosis not present

## 2023-05-04 DIAGNOSIS — N186 End stage renal disease: Secondary | ICD-10-CM | POA: Diagnosis not present

## 2023-05-04 DIAGNOSIS — N179 Acute kidney failure, unspecified: Secondary | ICD-10-CM | POA: Diagnosis not present

## 2023-05-07 DIAGNOSIS — Z992 Dependence on renal dialysis: Secondary | ICD-10-CM | POA: Diagnosis not present

## 2023-05-07 DIAGNOSIS — N2581 Secondary hyperparathyroidism of renal origin: Secondary | ICD-10-CM | POA: Diagnosis not present

## 2023-05-07 DIAGNOSIS — N186 End stage renal disease: Secondary | ICD-10-CM | POA: Diagnosis not present

## 2023-05-07 DIAGNOSIS — D631 Anemia in chronic kidney disease: Secondary | ICD-10-CM | POA: Diagnosis not present

## 2023-05-07 DIAGNOSIS — E1151 Type 2 diabetes mellitus with diabetic peripheral angiopathy without gangrene: Secondary | ICD-10-CM | POA: Diagnosis not present

## 2023-05-08 DIAGNOSIS — N184 Chronic kidney disease, stage 4 (severe): Secondary | ICD-10-CM | POA: Diagnosis not present

## 2023-05-08 DIAGNOSIS — J449 Chronic obstructive pulmonary disease, unspecified: Secondary | ICD-10-CM | POA: Diagnosis not present

## 2023-05-10 DIAGNOSIS — Z992 Dependence on renal dialysis: Secondary | ICD-10-CM | POA: Diagnosis not present

## 2023-05-10 DIAGNOSIS — D631 Anemia in chronic kidney disease: Secondary | ICD-10-CM | POA: Diagnosis not present

## 2023-05-10 DIAGNOSIS — E1151 Type 2 diabetes mellitus with diabetic peripheral angiopathy without gangrene: Secondary | ICD-10-CM | POA: Diagnosis not present

## 2023-05-10 DIAGNOSIS — N2581 Secondary hyperparathyroidism of renal origin: Secondary | ICD-10-CM | POA: Diagnosis not present

## 2023-05-10 DIAGNOSIS — N186 End stage renal disease: Secondary | ICD-10-CM | POA: Diagnosis not present

## 2023-05-14 DIAGNOSIS — E1151 Type 2 diabetes mellitus with diabetic peripheral angiopathy without gangrene: Secondary | ICD-10-CM | POA: Diagnosis not present

## 2023-05-14 DIAGNOSIS — N2581 Secondary hyperparathyroidism of renal origin: Secondary | ICD-10-CM | POA: Diagnosis not present

## 2023-05-14 DIAGNOSIS — Z992 Dependence on renal dialysis: Secondary | ICD-10-CM | POA: Diagnosis not present

## 2023-05-14 DIAGNOSIS — D631 Anemia in chronic kidney disease: Secondary | ICD-10-CM | POA: Diagnosis not present

## 2023-05-14 DIAGNOSIS — N186 End stage renal disease: Secondary | ICD-10-CM | POA: Diagnosis not present

## 2023-05-17 DIAGNOSIS — N186 End stage renal disease: Secondary | ICD-10-CM | POA: Diagnosis not present

## 2023-05-17 DIAGNOSIS — E1151 Type 2 diabetes mellitus with diabetic peripheral angiopathy without gangrene: Secondary | ICD-10-CM | POA: Diagnosis not present

## 2023-05-17 DIAGNOSIS — Z992 Dependence on renal dialysis: Secondary | ICD-10-CM | POA: Diagnosis not present

## 2023-05-17 DIAGNOSIS — D631 Anemia in chronic kidney disease: Secondary | ICD-10-CM | POA: Diagnosis not present

## 2023-05-17 DIAGNOSIS — N2581 Secondary hyperparathyroidism of renal origin: Secondary | ICD-10-CM | POA: Diagnosis not present

## 2023-05-19 ENCOUNTER — Ambulatory Visit: Payer: Medicare Other | Attending: Cardiology | Admitting: Cardiology

## 2023-05-19 ENCOUNTER — Encounter: Payer: Self-pay | Admitting: Cardiology

## 2023-05-19 ENCOUNTER — Ambulatory Visit: Payer: Medicare Other | Admitting: Pulmonary Disease

## 2023-05-19 ENCOUNTER — Encounter: Payer: Self-pay | Admitting: Pulmonary Disease

## 2023-05-19 VITALS — BP 124/62 | HR 73 | Temp 98.2°F | Ht 65.0 in | Wt 195.8 lb

## 2023-05-19 VITALS — BP 142/58 | HR 63 | Ht 65.0 in | Wt 196.6 lb

## 2023-05-19 DIAGNOSIS — N186 End stage renal disease: Secondary | ICD-10-CM

## 2023-05-19 DIAGNOSIS — I480 Paroxysmal atrial fibrillation: Secondary | ICD-10-CM

## 2023-05-19 DIAGNOSIS — J449 Chronic obstructive pulmonary disease, unspecified: Secondary | ICD-10-CM

## 2023-05-19 DIAGNOSIS — G4733 Obstructive sleep apnea (adult) (pediatric): Secondary | ICD-10-CM

## 2023-05-19 MED ORDER — BREZTRI AEROSPHERE 160-9-4.8 MCG/ACT IN AERO: 2.0000 | INHALATION_SPRAY | Freq: Two times a day (BID) | RESPIRATORY_TRACT | 10 refills | Status: DC

## 2023-05-19 MED ORDER — ATORVASTATIN CALCIUM 40 MG PO TABS: 40.0000 mg | ORAL_TABLET | Freq: Every day | ORAL | 3 refills | Status: DC

## 2023-05-19 MED ORDER — ALBUTEROL SULFATE (2.5 MG/3ML) 0.083% IN NEBU: 2.5000 mg | INHALATION_SOLUTION | RESPIRATORY_TRACT | 5 refills | Status: AC | PRN

## 2023-05-19 MED ORDER — ISOSORBIDE MONONITRATE ER 60 MG PO TB24: 60.0000 mg | ORAL_TABLET | Freq: Every day | ORAL | 3 refills | Status: AC

## 2023-05-19 MED ORDER — APIXABAN 2.5 MG PO TABS: 2.5000 mg | ORAL_TABLET | Freq: Two times a day (BID) | ORAL | 0 refills | Status: DC

## 2023-05-19 MED ORDER — NITROGLYCERIN 0.4 MG SL SUBL: 0.4000 mg | SUBLINGUAL_TABLET | SUBLINGUAL | 4 refills | Status: AC | PRN

## 2023-05-19 MED ORDER — APIXABAN 2.5 MG PO TABS: 2.5000 mg | ORAL_TABLET | Freq: Two times a day (BID) | ORAL | 3 refills | Status: DC

## 2023-05-19 NOTE — Patient Instructions (Addendum)
 Medication Instructions:  Your physician recommends that you continue on your current medications as directed. Please refer to the Current Medication list given to you today.  *If you need a refill on your cardiac medications before your next appointment, please call your pharmacy*  Lab Work: None ordered today. If you have labs (blood work) drawn today and your tests are completely normal, you will receive your results only by: MyChart Message (if you have MyChart) OR A paper copy in the mail If you have any lab test that is abnormal or we need to change your treatment, we will call you to review the results.  Testing/Procedures: None ordered today.  Follow-Up: At [facility], you and your health needs are our priority.  As part of our continuing mission to provide you with exceptional heart care, we have created designated Provider Care Teams.  These Care Teams include your primary Cardiologist (physician) and Advanced Practice Providers (APPs -  Physician Assistants and Nurse Practitioners) who all work together to provide you with the care you need, when you need it.  We recommend signing up for the patient portal called "MyChart".  Sign up information is provided on this After Visit Summary.  MyChart is used to connect with patients for Virtual Visits (Telemedicine).  Patients are able to view lab/test results, encounter notes, upcoming appointments, etc.  Non-urgent messages can be sent to your provider as well.   To learn more about what you can do with MyChart, go to [email or web address].    Your next appointment:   1 year(s)  The format for your next appointment:   In Person  Provider:   Dorothye Gathers, MD {

## 2023-05-19 NOTE — Patient Instructions (Signed)
 Your medications were sent into the mail order company-[facility]  and albuterol  nebulization  Your CPAP shows it is working well, no changes need to be made  I will see you back in about 6 months  Call us  with significant concerns

## 2023-05-19 NOTE — Progress Notes (Signed)
 Cardiology Office Note:  .   Date:  05/19/2023  ID:  ASHONTI JAKUB, DOB 1940-06-10, MRN [id number] PCP: Annette Barters, MD  [facility] Providers Cardiologist:  Dorothye Gathers, MD Electrophysiologist:  Richardo Chandler, MD     History of Present Illness: Kayla [name] is a 83 y.o. female Discussed with the use of AI scribe  History of Present Illness   The 83 year old patient with a history of sixth sinus syndrome, diabetes, hypertension, coronary artery disease, chronic kidney disease, paroxysmal atrial fibrillation, COPD, GERD, and chronic venous insufficiency, presented for a follow-up after pacemaker extraction due to MRSA bacteremia. The patient had been experiencing bradycardia in the forties and fifties and was initiated on hemodialysis. The patient's chronic diastolic heart failure was managed with dialysis and torsemide .  The patient reported feeling weak and experiencing chest discomfort after dialysis sessions, but denied any pain associated with the procedure. She also reported significant weight loss during a recent hospital stay. The patient's physical examination was unremarkable, with no signs of edema.  The patient had a history of left toe amputation and was on Eliquis  for chronic anticoagulation. She had a right-sided dialysis catheter and was on atorvastatin  40 mg daily. The patient's LDL was 25 on 03/02/23 and creatinine was 2.4.          Studies Reviewed: Aaron Aas   EKG Interpretation Date/Time:  Wednesday May 19 2023 11:24:24 EST Ventricular Rate:  63 PR Interval:    QRS Duration:  82 QT Interval:  420 QTC Calculation: 429 R Axis:   23  Text Interpretation: Normal sinus rhythm with PAC's When compared with ECG of 25-Oct-2022 07:28, PREVIOUS ECG IS PRESENT Confirmed by Dorothye Gathers ([id number]) on 05/19/2023 11:30:36 AM    Results   LABS LDL: 25 (03/02/2023) Creatinine: 2.4  DIAGNOSTIC TEE: Thickening (09/08/2022) EKG: Normal rhythm with premature  atrial contractions     Risk Assessment/Calculations:           Physical Exam:   VS:  BP (!) 142/58 (BP Location: Right Arm, Patient Position: Sitting, Cuff Size: Large)   Pulse 63   Ht 5\' 5"  (1.651 m)   Wt 196 lb 9.6 oz (89.2 kg)   SpO2 96%   BMI 32.72 kg/m    Wt Readings from Last 3 Encounters:  05/19/23 196 lb 9.6 oz (89.2 kg)  05/19/23 195 lb 12.8 oz (88.8 kg)  04/29/23 196 lb 14.4 oz (89.3 kg)    GEN: Well nourished, well developed in no acute distress NECK: No JVD; No carotid bruits CARDIAC: RRR, no murmurs, no rubs, no gallops RESPIRATORY:  Clear to auscultation without rales, wheezing or rhonchi  ABDOMEN: Soft, non-tender, non-distended EXTREMITIES:  No edema; No deformity   ASSESSMENT AND PLAN: .    Assessment and Plan    Pacemaker lead extracted due to MRSA bacteremia and TEE showing thickening.  Sick Sinus Syndrome: . No need for pacemaker replacement at this time. Bradycardia in the 40s and 50s noted. -No plans for reimplantation unless symptomatic bradycardia occurs.  Chronic Diastolic Heart Failure: Managed with dialysis and Torsemide . No current symptoms. -Continue current management.  End Stage Renal Disease: On hemodialysis. No current complications. -Continue current management.  Atrial Fibrillation: Currently in normal rhythm with premature atrial contractions. On Eliquis  for anticoagulation. -Continue Eliquis  2.5mg  twice a day on dialysis.  Follow-up in 1 year.               Signed, Dorothye Gathers, MD

## 2023-05-19 NOTE — Progress Notes (Signed)
 Kayla [name]    [id number]    [birth year]  Primary Care Physician:Hamrick, Orest Bio, MD  Referring Physician: Annette Barters, MD [address] [place],  [place] [address]  Chief complaint:   Patient with a history of chronic obstructive pulmonary disease, obstructive sleep apnea  HPI:  Has no concerns today  Using CPAP nightly and benefiting from it She wakes up feeling like she is at a good nights rest Wakes up a couple times during the night to use the bathroom  She does have some congestion, does not believe it is an infection right now -Encouraged to give us  a call if it turns into an infection that she is concerned about  She does have a cough but no significant expectoration  Compliant with Breztri  Compliant with oxygen  use  Compliant with albuterol   Back pain, hip pain limits mobility  No recent hospitalization  She ambulates with a walker Has balance issues, chronic pain  She does follow with pain management  Reformed smoker, history of chronic respiratory failure, insulin -dependent diabetes, atrial fibrillation, diastolic heart failure, chronic kidney disease  Last PSG was October 2006-AHI of 10 Was titrated to CPAP of 11  Did not tolerate Trelegy, Incruse, Spiriva  in the past, breathing has been best with Breztri  On 3 L of oxygen  chronically  Outpatient Encounter Medications as of 05/19/2023  Medication Sig   acetaminophen  (TYLENOL ) 500 MG tablet Take 1,000-1,500 mg by mouth every 8 (eight) hours as needed for mild pain, moderate pain or headache.   albuterol  (PROVENTIL ) (2.5 MG/3ML) 0.083% nebulizer solution Take 3 mLs (2.5 mg total) by nebulization every 2 (two) hours as needed for wheezing or shortness of breath.   albuterol  (VENTOLIN  HFA) 108 (90 Base) MCG/ACT inhaler Inhale 2 puffs into the lungs every 6 (six) hours as needed for wheezing or shortness of breath.   Alcohol  Swabs  (B-D SINGLE USE SWABS  REGULAR) PADS    allopurinol   (ZYLOPRIM ) 100 MG tablet Take 100 mg by mouth 2 (two) times daily.   amLODipine  (NORVASC ) 5 MG tablet Take 2 tablets (10 mg total) by mouth daily. (Patient taking differently: Take 5 mg by mouth daily.)   ascorbic acid  (VITAMIN C ) 500 MG tablet Take 500 mg by mouth daily with lunch.   atorvastatin  (LIPITOR) 40 MG tablet TAKE 1 TABLET EVERY DAY   azithromycin  (ZITHROMAX  Z-PAK) 250 MG tablet Take 2 tablets day 1 and then 1 daily for 4 days   Blood Glucose Calibration (TRUE METRIX LEVEL 1) Low SOLN    Budeson-Glycopyrrol-Formoterol  (BREZTRI  AEROSPHERE) 160-9-4.8 MCG/ACT AERO Inhale 2 puffs into the lungs in the morning and at bedtime.   Carboxymethylcellulose Sodium (ARTIFICIAL TEARS OP) Place 1 drop into both eyes daily as needed (dry eyes).   Doxercalciferol (HECTOROL IV) Doxercalciferol (Hectorol)   ELIQUIS  2.5 MG TABS tablet TAKE 1 TABLET TWICE DAILY   escitalopram  (LEXAPRO ) 5 MG tablet Take 1 tablet (5 mg total) by mouth daily.   ferrous sulfate  325 (65 FE) MG tablet Take 1 tablet (325 mg total) by mouth daily with breakfast.   fluticasone  (FLONASE ) 50 MCG/ACT nasal spray USE 2 SPRAYS NASALLY DAILY (Patient taking differently: Place 1 spray into both nostrils daily as needed for allergies.)   gabapentin  (NEURONTIN ) 800 MG tablet Take 800 mg by mouth 3 (three) times daily.   guaiFENesin  (ROBITUSSIN) 100 MG/5ML liquid Take 5 mLs by mouth every 4 (four) hours as needed for cough or to loosen phlegm.  HYDROcodone -acetaminophen  (NORCO) 10-325 MG tablet Take 1 tablet by mouth every 6 (six) hours as needed. (Patient taking differently: Take 1 tablet by mouth every 6 (six) hours as needed for severe pain (pain score 7-10).)   HYDROXYZINE  HCL PO Take 25 mg by mouth at bedtime as needed (insomnia).   insulin  aspart (NOVOLOG ) 100 UNIT/ML FlexPen Inject 0-20 Units into the skin See admin instructions. Inject 0-20 units into the skin three times a day before meals "as needed," per SLIDING SCALE   isosorbide   mononitrate (IMDUR ) 60 MG 24 hr tablet Take 1 tablet (60 mg total) by mouth daily.   levothyroxine  (SYNTHROID ) 200 MCG tablet Take 200 mcg by mouth daily before breakfast.   loratadine  (CLARITIN ) 10 MG tablet Take 1 tablet (10 mg total) by mouth daily.   melatonin 5 MG TABS Take 1 tablet (5 mg total) by mouth at bedtime as needed.   Methoxy PEG-Epoetin Beta (MIRCERA IJ) Mircera   multivitamin (RENA-VIT) TABS tablet Take 1 tablet by mouth at bedtime.   nitroGLYCERIN  (NITROSTAT ) 0.4 MG SL tablet Place 1 tablet (0.4 mg total) under the tongue every 5 (five) minutes as needed for chest pain.   polyethylene glycol (MIRALAX  / GLYCOLAX ) 17 g packet Take 17 g by mouth daily as needed for mild constipation or moderate constipation.   predniSONE  (DELTASONE ) 20 MG tablet Take 1 tablet (20 mg total) by mouth daily with breakfast.   PRESCRIPTION MEDICATION CPAP- At bedtime and as needed during any time of rest   promethazine  (PHENERGAN ) 25 MG suppository SMARTSIG:0.5-1 SUPPOS Rectally Every 6 Hours PRN   sevelamer  carbonate (RENVELA ) 800 MG tablet Take 1 tablet (800 mg total) by mouth 3 (three) times daily with meals.   torsemide  (DEMADEX ) 100 MG tablet Take 1 tablet (100 mg total) by mouth 2 (two) times daily.   TRUE METRIX BLOOD GLUCOSE TEST test strip    TRUEplus Lancets 28G MISC    vitamin B-12 (CYANOCOBALAMIN ) 1000 MCG tablet Take 1,000 mcg by mouth daily with lunch.   Vitamin D3 (VITAMIN D ) 25 MCG tablet Take 1,000 Units by mouth daily with lunch.   No facility-administered encounter medications on file as of 05/19/2023.    Allergies as of 05/19/2023 - Review Complete 05/19/2023  Allergen Reaction Noted   Oxycodone  Itching and Other (See Comments) 01/30/2021   Trelegy ellipta  [fluticasone -umeclidin-vilant] Nausea And Vomiting, Other (See Comments), and Cough 03/18/2021   Trulicity [dulaglutide] Nausea And Vomiting, Nausea Only, Other (See Comments), and Cough 03/16/2019    Past Medical History:   Diagnosis Date   Allergy    Anxiety    Arthritis    CAD (coronary artery disease)    Mild per remote cath in 2006, /    Nuclear, January, 2013, no significant abnormality,   Carotid artery disease (HCC)    Doppler, January, 2013, 0 39% RIC A., 40-59% LICA, stable   Cataract    CHF (congestive heart failure) (HCC)    Chronic kidney disease    Complication of anesthesia    wakes up "mean"   COPD 02/16/2007   Intolerant of Spiriva , tudorza Intolerant of Trelegy    COPD (chronic obstructive pulmonary disease) (HCC)    Coronary atherosclerosis 11/24/2008   Catheterization 2006, nonobstructive coronary disease   //   nuclear 2013, low risk     Diabetes mellitus, type 2 (HCC)    Diverticula of colon    DIVERTICULOSIS OF COLON 03/23/2007   Qualifier: Diagnosis of  By: Matilde Son MD, Vineet  Dizziness    occasional   Dizzy spells 05/04/2011   DM (diabetes mellitus) (HCC) 03/23/2007   Qualifier: Diagnosis of  By: Matilde Son MD, Vineet     Dyspnea    On exertion   Ejection fraction    Essential hypertension 02/16/2007   Qualifier: Diagnosis of  By: Nolon Baxter CMA, Tammy     G E R D 03/23/2007   Qualifier: Diagnosis of  By: Matilde Son MD, Vineet     Gall bladder disease 04/28/2016   Gastritis and gastroduodenitis    GERD (gastroesophageal reflux disease)    Goiter    hx of   Headache(784.0)    migraine   Hyperlipidemia    Hypertension    Hypothyroidism    HYPOTHYROIDISM, POSTSURGICAL 06/04/2008   Qualifier: Diagnosis of  By: [place] Hacker MD, Sean A    Left ventricular hypertrophy    Leg edema    occasional swelling   Leg swelling 12/09/2018   Myofascial pain syndrome, cervical 01/06/2018   Obesity    OBESITY 11/24/2008   Qualifier: Diagnosis of  By: Jaramillo, Luz     OBSTRUCTIVE SLEEP APNEA 02/16/2007   Auto CPAP 09/03/13 to 10/02/13 >> used on 30 of 30 nights with average 6 hrs and 3 min.  Average AHI is 3.1 with median CPAP 5 cm H2O and 95 th percentile CPAP 6 cm H2O.     Occipital neuralgia of  right side 01/06/2018   OSA (obstructive sleep apnea)    cpap setting of 12   Pain in joint of right hip 01/21/2018   Palpitations 01/21/2011   48 hour Holter, 2015, scattered PACs and PVCs.    Paroxysmal atrial fibrillation (HCC)    Pneumonia    PONV (postoperative nausea and vomiting)    severe after every surgery   Pulmonary hypertension, secondary    RHINITIS 07/21/2010        RUQ abdominal pain    S/P left TKA 07/27/2011   Sinus node dysfunction (HCC) 11/08/2015   Sleep apnea    Spinal stenosis of cervical region 11/07/2014   Urinary frequency 12/09/2018   UTI (urinary tract infection) 08/15/2019   per pt report   VENTRICULAR HYPERTROPHY, LEFT 11/24/2008   Qualifier: Diagnosis of  By: Malon Seamen      Past Surgical History:  Procedure Laterality Date   ABDOMINAL HYSTERECTOMY     with appendectomy and colon polypectomy   AMPUTATION TOE Left 12/15/2019   Procedure: AMPUTATION  LEFT GREAT TOE;  Surgeon: Camilo Cella, DPM;  Location: [facility];  Service: Podiatry;  Laterality: Left;   AMPUTATION TOE Left 01/22/2022   Procedure: AMPUTATION TOE 4th digit;  Surgeon: Velma Ghazi, DPM;  Location: [facility];  Service: Podiatry;  Laterality: Left;   AMPUTATION TOE Left 09/04/2022   Procedure: LEFT BIG TOE AMPUTAION;  Surgeon: Charity Conch, DPM;  Location: [facility];  Service: Podiatry;  Laterality: Left;   ANTERIOR CERVICAL DECOMP/DISCECTOMY FUSION N/A 11/07/2014   Procedure: Anterior Cervical diskectomy and fusion Cervical four-five, Cervical five-six, Cervical six-seven;  Surgeon: Gearl Keens, MD;  Location: [facility];  Service: Neurosurgery;  Laterality: N/A;   APPENDECTOMY     ARTERY BIOPSY Right 02/16/2017   Procedure: BIOPSY TEMPORAL ARTERY;  Surgeon: Jackquelyn Mass, MD;  Location: [facility];  Service: Vascular;  Laterality: Right;   ARTERY BIOPSY Left 04/16/2020   Procedure: BIOPSY TEMPORAL ARTERY;  Surgeon: Jackquelyn Mass, MD;  Location: [facility];  Service:  Vascular;  Laterality: Left;  LEFT  TEMPLE   AV FISTULA PLACEMENT Left 04/13/2023   Procedure: LEFT ARM ARTERIOVENOUS (AV) FISTULA CREATION;  Surgeon: Kayla Part, MD;  Location[facility];  Service: Vascular;  Laterality: Left;   BACK SURGERY     CARDIAC CATHETERIZATION     CATARACT EXTRACTION W/ INTRAOCULAR LENS  IMPLANT, BILATERAL Bilateral    CHOLECYSTECTOMY N/A 04/29/2016   Procedure: LAPAROSCOPIC CHOLECYSTECTOMY WITH INTRAOPERATIVE CHOLANGIOGRAM;  Surgeon: Juanita Norlander, MD;  Location: [facility];  Service: General;  Laterality: N/A;   EP IMPLANTABLE DEVICE N/A 11/08/2015   MRI COMPATABLE -- Procedure: Pacemaker Implant;  Surgeon: Verona Goodwill, MD;  Location: [facility];  Service: Cardiovascular;  Laterality: N/A;   INCISION AND DRAINAGE ABSCESS Right 05/30/2021   Procedure: IRRIGATION AND DEBRIDEMENT OF PREPATELLA BURSA;  Surgeon: Claiborne Crew, MD;  Location: [facility];  Service: Orthopedics;  Laterality: Right;  60 wants standard knee draping and pulse lavage   INCISION AND DRAINAGE ABSCESS Right 07/22/2021   Procedure: INCISION AND DRAINAGE ABSCESS RIGHT KNEE;  Surgeon: Claiborne Crew, MD;  Location: [facility];  Service: Orthopedics;  Laterality: Right;   INSERT / REPLACE / REMOVE PACEMAKER  2017   Dr. Aida [facility])   IR FLUORO GUIDE CV LINE RIGHT  07/25/2021   IR FLUORO GUIDE CV LINE RIGHT  11/04/2022   IR REMOVAL TUN CV CATH W/O FL  09/04/2021   IR US  GUIDE VASC ACCESS RIGHT  07/25/2021   IR US  GUIDE VASC ACCESS RIGHT  11/04/2022   JOINT REPLACEMENT     LEAD EXTRACTION N/A 09/08/2022   Procedure: LEAD EXTRACTION;  Surgeon: Tammie Fall, MD;  [facility];  Service: Cardiovascular;  Laterality: N/A;   LEAD EXTRACTION N/A 09/11/2022   Procedure: LEAD EXTRACTION;  Surgeon: Tammie Fall, MD;  [facility];  Service: Cardiovascular;  Laterality: N/A;   LUMBAR DISC SURGERY  1990's   x 2   TEE WITHOUT CARDIOVERSION N/A 09/07/2022   Procedure: TRANSESOPHAGEAL  ECHOCARDIOGRAM;  Surgeon: Jann Melody, MD;  [facility]: [facility];  Service: Cardiovascular;  Laterality: N/A;   THYROIDECTOMY  2011   TOTAL KNEE ARTHROPLASTY  07/27/2011   Procedure: TOTAL KNEE ARTHROPLASTY;  Surgeon: Bevin Bucks, MD;  Location: [facility];  Service: Orthopedics;  Laterality: Left;   UPPER ESOPHAGEAL ENDOSCOPIC ULTRASOUND (EUS) N/A 05/14/2016   Procedure: UPPER ESOPHAGEAL ENDOSCOPIC ULTRASOUND (EUS);  Surgeon: Janel Medford, MD;  Location: [facility];  Service: Endoscopy;  Laterality: N/A;  radial only-will be done mod if needed    Family History  Problem Relation Age of Onset   Heart Problems Mother    Heart defect Mother        valve problems   Heart Problems Father    Heart disease Brother    Lung cancer Daughter        non small cell    Diabetes Maternal Aunt    Diabetes Maternal Uncle    Throat cancer Brother    Colon cancer Neg Hx    Colon polyps Neg Hx    Esophageal cancer Neg Hx    Rectal cancer Neg Hx    Stomach cancer Neg Hx     Social History   Socioeconomic History   Marital status: Married    Spouse name: Suzanne Odoherty   Number of children: 3   Years of education: 12   Highest education level: Not on file  Occupational History   Occupation: retired  Tobacco  Use   Smoking status: Former    Current packs/day: 0.00    Average packs/day: 2.0 packs/day for 33.0 years (66.0 ttl pk-yrs)    Types: Cigarettes    Start date: 19    Quit date: 05/04/1997    Years since quitting: 26.0   Smokeless tobacco: Never  Vaping Use   Vaping status: Never Used  Substance and Sexual Activity   Alcohol  use: No   Drug use: No   Sexual activity: Yes  Other Topics Concern   Not on file  Social History Narrative   Patient lives at home with her husband Royston Cornea .   Retired.   Education 12 th grade.   Right handed   Caffeine two cups of coffee.   Social Drivers of Corporate investment banker Strain: Low Risk  (01/31/2021)   Overall  Financial Resource Strain (CARDIA)    Difficulty of Paying Living Expenses: Not very hard  Food Insecurity: No Food Insecurity (11/04/2022)   Hunger Vital Sign    Worried About Running Out of Food in the Last Year: Never true    Ran Out of Food in the Last Year: Never true  Transportation Needs: No Transportation Needs (11/04/2022)   PRAPARE - Administrator, Civil Service (Medical): No    Lack of Transportation (Non-Medical): No  Physical Activity: Insufficiently Active (09/30/2021)   Exercise Vital Sign    Days of Exercise per Week: 5 days    Minutes of Exercise per Session: 20 min  Stress: No Stress Concern Present (09/30/2021)   [employer] of Occupational Health - Occupational Stress Questionnaire    Feeling of Stress : Not at all  Social Connections: Socially Integrated (09/30/2021)   Social Connection and Isolation Panel [NHANES]    Frequency of Communication with Friends and Family: More than three times a week    Frequency of Social Gatherings with Friends and Family: Twice a week    Attends Religious Services: 1 to 4 times per year    Active Member of [employer] or Organizations: Yes    Attends Banker Meetings: 1 to 4 times per year    Marital Status: Married  Catering manager Violence: Not At Risk (11/04/2022)   Humiliation, Afraid, Rape, and Kick questionnaire    Fear of Current or Ex-Partner: No    Emotionally Abused: No    Physically Abused: No    Sexually Abused: No    Review of Systems  Respiratory:  Positive for apnea, cough and shortness of breath.   Psychiatric/Behavioral:  Positive for sleep disturbance.     Vitals:   05/19/23 0837  BP: 124/62  Pulse: 73  Temp: 98.2 F (36.8 C)  SpO2: 92%     Physical Exam Constitutional:      Appearance: She is obese.  HENT:     Head: Normocephalic.     Mouth/Throat:     Mouth: Mucous membranes are moist.  Eyes:     General: No scleral icterus.    Pupils: Pupils are equal, round, and  reactive to light.  Cardiovascular:     Rate and Rhythm: Normal rate and regular rhythm.     Heart sounds: No murmur heard.    No friction rub.  Pulmonary:     Effort: No respiratory distress.     Breath sounds: Normal breath sounds. No stridor. No wheezing or rhonchi.  Musculoskeletal:     Cervical back: No rigidity or tenderness.  Neurological:     Mental  Status: She is alert.  Psychiatric:        Mood and Affect: Mood normal.    Data Reviewed: Compliance reviewed over the last 30 days showing 90% compliance Average use of 7 hours 4 minutes AutoSet 5-15 95 percentile pressure of 12 Residual AHI of 1.9  Echocardiogram 02/11/2022 With ejection fraction of 60 to 65%  Assessment:  Mild obstructive sleep apnea -Continues to benefit from CPAP on a nightly basis -Wakes up feeling like she is at a good nights rest  Chronic respiratory failure remains on 3 L of oxygen  during the day  Moderate obstructive pulmonary disease -Continue Breztri  -Continue albuterol   Plan/Recommendations:  Continue Breztri  Continue albuterol   Continue CPAP nightly  Call us  if you have any significant concerns  Follow-up in 6 months   Myer Artis MD [facility] 05/19/2023, 8:41 AM  CC: Hamrick, Orest Bio, MD

## 2023-05-21 ENCOUNTER — Other Ambulatory Visit: Payer: Self-pay | Admitting: *Deleted

## 2023-05-21 DIAGNOSIS — E1151 Type 2 diabetes mellitus with diabetic peripheral angiopathy without gangrene: Secondary | ICD-10-CM | POA: Diagnosis not present

## 2023-05-21 DIAGNOSIS — N2581 Secondary hyperparathyroidism of renal origin: Secondary | ICD-10-CM | POA: Diagnosis not present

## 2023-05-21 DIAGNOSIS — D631 Anemia in chronic kidney disease: Secondary | ICD-10-CM | POA: Diagnosis not present

## 2023-05-21 DIAGNOSIS — Z992 Dependence on renal dialysis: Secondary | ICD-10-CM | POA: Diagnosis not present

## 2023-05-21 DIAGNOSIS — N186 End stage renal disease: Secondary | ICD-10-CM

## 2023-05-24 DIAGNOSIS — N186 End stage renal disease: Secondary | ICD-10-CM | POA: Diagnosis not present

## 2023-05-24 DIAGNOSIS — E1151 Type 2 diabetes mellitus with diabetic peripheral angiopathy without gangrene: Secondary | ICD-10-CM | POA: Diagnosis not present

## 2023-05-24 DIAGNOSIS — Z992 Dependence on renal dialysis: Secondary | ICD-10-CM | POA: Diagnosis not present

## 2023-05-24 DIAGNOSIS — D631 Anemia in chronic kidney disease: Secondary | ICD-10-CM | POA: Diagnosis not present

## 2023-05-24 DIAGNOSIS — N2581 Secondary hyperparathyroidism of renal origin: Secondary | ICD-10-CM | POA: Diagnosis not present

## 2023-05-27 ENCOUNTER — Ambulatory Visit (HOSPITAL_COMMUNITY)
Admission: RE | Admit: 2023-05-27 | Discharge: 2023-05-27 | Disposition: A | Discharge status: Home / Self Care | Payer: Medicare Other | Source: Ambulatory Visit | Attending: Vascular Surgery | Admitting: Vascular Surgery

## 2023-05-27 ENCOUNTER — Ambulatory Visit: Payer: Medicare Other

## 2023-05-27 VITALS — BP 114/53 | HR 68 | Temp 97.7°F | Resp 20 | Wt 196.0 lb

## 2023-05-27 DIAGNOSIS — N186 End stage renal disease: Secondary | ICD-10-CM

## 2023-05-27 DIAGNOSIS — Z992 Dependence on renal dialysis: Secondary | ICD-10-CM | POA: Diagnosis not present

## 2023-05-27 NOTE — Progress Notes (Signed)
POST OPERATIVE OFFICE NOTE    CC:  F/u for surgery  HPI:  This is a 83 y.o. female who is s/p left BC AV fistula by Dr. Karin Lieu on 04/13/23.  She has a a TDC placed else where.  Her HD is M/F.  She states she has developed left forearm pain volar into the thumb and pointer finger 2 weeks after surgery.  She was seen on 04/22/23.  She described  sharp pain in the left side of her neck/shoulder that radiates down her arm and into her hand.   On exam her left arm fistula has a good thrill on palpation, however she is very sensitive to touch at this time -She had a 2+ left radial pulse.  She was encouraged to rest her arm.    She is here today for duplex to check for maturity via duplex.     Allergies  Allergen Reactions   Oxycodone Itching and Other (See Comments)    Pt still takes this med even thought it causes itching   Trelegy Ellipta [Fluticasone-Umeclidin-Vilant] Nausea And Vomiting, Other (See Comments) and Cough    Made the patient sick to her stomach (only) several days after using it    Trulicity [Dulaglutide] Nausea And Vomiting, Nausea Only, Other (See Comments) and Cough    TRULICITY made the patient sick to her stomach (only) several days after using it    Current Outpatient Medications  Medication Sig Dispense Refill   acetaminophen (TYLENOL) 500 MG tablet Take 1,000-1,500 mg by mouth every 8 (eight) hours as needed for mild pain, moderate pain or headache.     albuterol (PROVENTIL) (2.5 MG/3ML) 0.083% nebulizer solution Take 3 mLs (2.5 mg total) by nebulization every 2 (two) hours as needed for wheezing or shortness of breath. 75 mL 5   albuterol (VENTOLIN HFA) 108 (90 Base) MCG/ACT inhaler Inhale 2 puffs into the lungs every 6 (six) hours as needed for wheezing or shortness of breath. 3 each 3   Alcohol Swabs (B-D SINGLE USE SWABS REGULAR) PADS      allopurinol (ZYLOPRIM) 100 MG tablet Take 100 mg by mouth 2 (two) times daily.     amLODipine (NORVASC) 5 MG tablet Take 2  tablets (10 mg total) by mouth daily. (Patient taking differently: Take 5 mg by mouth daily.)     apixaban (ELIQUIS) 2.5 MG TABS tablet Take 1 tablet (2.5 mg total) by mouth 2 (two) times daily. 180 tablet 3   ascorbic acid (VITAMIN C) 500 MG tablet Take 500 mg by mouth daily with lunch.     atorvastatin (LIPITOR) 40 MG tablet Take 1 tablet (40 mg total) by mouth daily. 90 tablet 3   azithromycin (ZITHROMAX Z-PAK) 250 MG tablet Take 2 tablets day 1 and then 1 daily for 4 days 6 each 0   Blood Glucose Calibration (TRUE METRIX LEVEL 1) Low SOLN      Budeson-Glycopyrrol-Formoterol (BREZTRI AEROSPHERE) 160-9-4.8 MCG/ACT AERO Inhale 2 puffs into the lungs in the morning and at bedtime. 3 each 10   Carboxymethylcellulose Sodium (ARTIFICIAL TEARS OP) Place 1 drop into both eyes daily as needed (dry eyes).     Doxercalciferol (HECTOROL IV) Doxercalciferol (Hectorol)     escitalopram (LEXAPRO) 5 MG tablet Take 1 tablet (5 mg total) by mouth daily. 30 tablet 0   ferrous sulfate 325 (65 FE) MG tablet Take 1 tablet (325 mg total) by mouth daily with breakfast. 90 tablet 3   fluticasone (FLONASE) 50 MCG/ACT nasal spray USE  2 SPRAYS NASALLY DAILY (Patient taking differently: Place 1 spray into both nostrils daily as needed for allergies.) 48 g 1   gabapentin (NEURONTIN) 800 MG tablet Take 800 mg by mouth 3 (three) times daily.     guaiFENesin (ROBITUSSIN) 100 MG/5ML liquid Take 5 mLs by mouth every 4 (four) hours as needed for cough or to loosen phlegm.     HYDROcodone-acetaminophen (NORCO) 10-325 MG tablet Take 1 tablet by mouth every 6 (six) hours as needed. (Patient taking differently: Take 1 tablet by mouth every 6 (six) hours as needed for severe pain (pain score 7-10).) 10 tablet 0   HYDROXYZINE HCL PO Take 25 mg by mouth at bedtime as needed (insomnia).     insulin aspart (NOVOLOG) 100 UNIT/ML FlexPen Inject 0-20 Units into the skin See admin instructions. Inject 0-20 units into the skin three times a day  before meals "as needed," per SLIDING SCALE     isosorbide mononitrate (IMDUR) 60 MG 24 hr tablet Take 1 tablet (60 mg total) by mouth daily. 90 tablet 3   levothyroxine (SYNTHROID) 200 MCG tablet Take 200 mcg by mouth daily before breakfast.     loratadine (CLARITIN) 10 MG tablet Take 1 tablet (10 mg total) by mouth daily.     melatonin 5 MG TABS Take 1 tablet (5 mg total) by mouth at bedtime as needed.  0   Methoxy PEG-Epoetin Beta (MIRCERA IJ) Mircera     multivitamin (RENA-VIT) TABS tablet Take 1 tablet by mouth at bedtime. 30 tablet 0   nitroGLYCERIN (NITROSTAT) 0.4 MG SL tablet Place 1 tablet (0.4 mg total) under the tongue every 5 (five) minutes as needed for chest pain. 25 tablet 4   polyethylene glycol (MIRALAX / GLYCOLAX) 17 g packet Take 17 g by mouth daily as needed for mild constipation or moderate constipation.  0   predniSONE (DELTASONE) 20 MG tablet Take 1 tablet (20 mg total) by mouth daily with breakfast. 7 tablet 0   PRESCRIPTION MEDICATION CPAP- At bedtime and as needed during any time of rest     promethazine (PHENERGAN) 25 MG suppository SMARTSIG:0.5-1 SUPPOS Rectally Every 6 Hours PRN     sevelamer carbonate (RENVELA) 800 MG tablet Take 1 tablet (800 mg total) by mouth 3 (three) times daily with meals. 90 tablet 0   torsemide (DEMADEX) 100 MG tablet Take 1 tablet (100 mg total) by mouth 2 (two) times daily. 60 tablet 0   TRUE METRIX BLOOD GLUCOSE TEST test strip      TRUEplus Lancets 28G MISC      vitamin B-12 (CYANOCOBALAMIN) 1000 MCG tablet Take 1,000 mcg by mouth daily with lunch.     Vitamin D3 (VITAMIN D) 25 MCG tablet Take 1,000 Units by mouth daily with lunch.     No current facility-administered medications for this visit.     ROS:  See HPI  Physical Exam:  Findings:  +--------------------+----------+-----------------+--------+  AVF                PSV (cm/s)Flow Vol (mL/min)Comments  +--------------------+----------+-----------------+--------+   Native artery inflow   222           629                 +--------------------+----------+-----------------+--------+  AVF Anastomosis        262                               +--------------------+----------+-----------------+--------+     +------------+----------+-------------+----------+--------+  OUTFLOW VEINPSV (cm/s)Diameter (cm)Depth (cm)Describe  +------------+----------+-------------+----------+--------+  Prox UA        175        0.58        0.83             +------------+----------+-------------+----------+--------+  Mid UA         124        0.68        0.43   tortuous  +------------+----------+-------------+----------+--------+  Dist UA        383        0.74        0.54   tortuous  +------------+----------+-------------+----------+--------+  AC Fossa       122        0.95        0.24             +------------+----------+-------------+----------+--------+     +--------------------+-------------+----------+---------+----------+-------  --+                     Diameter (cm)Depth (cm)BranchingPSV (cm/s)            +--------------------+-------------+----------+---------+----------+-------  --+  distal radial artery                                    98     antegrade  +--------------------+-------------+----------+---------+----------+-------  --+  distal ulnar artery                                     35     antegrade  +--------------------+-------------+----------+---------+----------+-------  --+     Summary:  Patent AVF.  Outflow vein is tortuous.  Flow volume is 629 ml/min.  No areas of stenosis or thrombus.  No significant branching.  Antegrade distal radial and ulnar arteries.      Incision:  well healed  Extremities:  palpable radial pulses 2+, palpable thrill in fistula, brisk cap refill Neuro: decreased sensation to touch left thumb verses right Pain distribution to thumb and pointer  finger.    Assessment/Plan:  This is a 84 y.o. female who is s/p:Left BC AV fistula creation  Her pain distribution is in the medial nerve and possible carpel tunnel syndrome.  The pain is now below the elbow.  I will encourage her to use her left UE to improve flow volume in the fistula.  Currently the vol is 629 ml/min.  The fistula size is maturing welll and the depth is < 0.6cm.  As far as her hand symptoms I showed her an example of a carpal tunnel brace to get  to rest her wrist.  I do not feel these symptoms are from steal.     -She will f/u in mid Feb for repeat duplex and exam.     Mosetta Pigeon PA-C [facility] [phone]   Call MD:  Chestine Spore

## 2023-05-28 DIAGNOSIS — E1151 Type 2 diabetes mellitus with diabetic peripheral angiopathy without gangrene: Secondary | ICD-10-CM | POA: Diagnosis not present

## 2023-05-28 DIAGNOSIS — Z992 Dependence on renal dialysis: Secondary | ICD-10-CM | POA: Diagnosis not present

## 2023-05-28 DIAGNOSIS — N186 End stage renal disease: Secondary | ICD-10-CM | POA: Diagnosis not present

## 2023-05-28 DIAGNOSIS — D631 Anemia in chronic kidney disease: Secondary | ICD-10-CM | POA: Diagnosis not present

## 2023-05-28 DIAGNOSIS — N2581 Secondary hyperparathyroidism of renal origin: Secondary | ICD-10-CM | POA: Diagnosis not present

## 2023-05-31 DIAGNOSIS — N2581 Secondary hyperparathyroidism of renal origin: Secondary | ICD-10-CM | POA: Diagnosis not present

## 2023-05-31 DIAGNOSIS — D631 Anemia in chronic kidney disease: Secondary | ICD-10-CM | POA: Diagnosis not present

## 2023-05-31 DIAGNOSIS — Z992 Dependence on renal dialysis: Secondary | ICD-10-CM | POA: Diagnosis not present

## 2023-05-31 DIAGNOSIS — E1151 Type 2 diabetes mellitus with diabetic peripheral angiopathy without gangrene: Secondary | ICD-10-CM | POA: Diagnosis not present

## 2023-05-31 DIAGNOSIS — N186 End stage renal disease: Secondary | ICD-10-CM | POA: Diagnosis not present

## 2023-06-01 DIAGNOSIS — E113213 Type 2 diabetes mellitus with mild nonproliferative diabetic retinopathy with macular edema, bilateral: Secondary | ICD-10-CM | POA: Diagnosis not present

## 2023-06-04 DIAGNOSIS — M1710 Unilateral primary osteoarthritis, unspecified knee: Secondary | ICD-10-CM | POA: Diagnosis not present

## 2023-06-04 DIAGNOSIS — N186 End stage renal disease: Secondary | ICD-10-CM | POA: Diagnosis not present

## 2023-06-04 DIAGNOSIS — D631 Anemia in chronic kidney disease: Secondary | ICD-10-CM | POA: Diagnosis not present

## 2023-06-04 DIAGNOSIS — E349 Endocrine disorder, unspecified: Secondary | ICD-10-CM | POA: Diagnosis not present

## 2023-06-04 DIAGNOSIS — M353 Polymyalgia rheumatica: Secondary | ICD-10-CM | POA: Diagnosis not present

## 2023-06-04 DIAGNOSIS — Z992 Dependence on renal dialysis: Secondary | ICD-10-CM | POA: Diagnosis not present

## 2023-06-04 DIAGNOSIS — M8580 Other specified disorders of bone density and structure, unspecified site: Secondary | ICD-10-CM | POA: Diagnosis not present

## 2023-06-04 DIAGNOSIS — Z1389 Encounter for screening for other disorder: Secondary | ICD-10-CM | POA: Diagnosis not present

## 2023-06-04 DIAGNOSIS — M549 Dorsalgia, unspecified: Secondary | ICD-10-CM | POA: Diagnosis not present

## 2023-06-04 DIAGNOSIS — G894 Chronic pain syndrome: Secondary | ICD-10-CM | POA: Diagnosis not present

## 2023-06-04 DIAGNOSIS — Z79891 Long term (current) use of opiate analgesic: Secondary | ICD-10-CM | POA: Diagnosis not present

## 2023-06-04 DIAGNOSIS — N2581 Secondary hyperparathyroidism of renal origin: Secondary | ICD-10-CM | POA: Diagnosis not present

## 2023-06-04 DIAGNOSIS — N179 Acute kidney failure, unspecified: Secondary | ICD-10-CM | POA: Diagnosis not present

## 2023-06-04 DIAGNOSIS — E1151 Type 2 diabetes mellitus with diabetic peripheral angiopathy without gangrene: Secondary | ICD-10-CM | POA: Diagnosis not present

## 2023-06-04 DIAGNOSIS — M25569 Pain in unspecified knee: Secondary | ICD-10-CM | POA: Diagnosis not present

## 2023-06-07 DIAGNOSIS — N186 End stage renal disease: Secondary | ICD-10-CM | POA: Diagnosis not present

## 2023-06-07 DIAGNOSIS — D508 Other iron deficiency anemias: Secondary | ICD-10-CM | POA: Diagnosis not present

## 2023-06-07 DIAGNOSIS — D631 Anemia in chronic kidney disease: Secondary | ICD-10-CM | POA: Diagnosis not present

## 2023-06-07 DIAGNOSIS — Z992 Dependence on renal dialysis: Secondary | ICD-10-CM | POA: Diagnosis not present

## 2023-06-07 DIAGNOSIS — N2581 Secondary hyperparathyroidism of renal origin: Secondary | ICD-10-CM | POA: Diagnosis not present

## 2023-06-07 DIAGNOSIS — R82998 Other abnormal findings in urine: Secondary | ICD-10-CM | POA: Diagnosis not present

## 2023-06-08 DIAGNOSIS — Z992 Dependence on renal dialysis: Secondary | ICD-10-CM | POA: Diagnosis not present

## 2023-06-08 DIAGNOSIS — N2581 Secondary hyperparathyroidism of renal origin: Secondary | ICD-10-CM | POA: Diagnosis not present

## 2023-06-08 DIAGNOSIS — D631 Anemia in chronic kidney disease: Secondary | ICD-10-CM | POA: Diagnosis not present

## 2023-06-08 DIAGNOSIS — R82998 Other abnormal findings in urine: Secondary | ICD-10-CM | POA: Diagnosis not present

## 2023-06-08 DIAGNOSIS — J449 Chronic obstructive pulmonary disease, unspecified: Secondary | ICD-10-CM | POA: Diagnosis not present

## 2023-06-08 DIAGNOSIS — D508 Other iron deficiency anemias: Secondary | ICD-10-CM | POA: Diagnosis not present

## 2023-06-08 DIAGNOSIS — N184 Chronic kidney disease, stage 4 (severe): Secondary | ICD-10-CM | POA: Diagnosis not present

## 2023-06-08 DIAGNOSIS — N186 End stage renal disease: Secondary | ICD-10-CM | POA: Diagnosis not present

## 2023-06-10 DIAGNOSIS — R110 Nausea: Secondary | ICD-10-CM | POA: Diagnosis not present

## 2023-06-10 DIAGNOSIS — J441 Chronic obstructive pulmonary disease with (acute) exacerbation: Secondary | ICD-10-CM | POA: Diagnosis not present

## 2023-06-10 DIAGNOSIS — R6889 Other general symptoms and signs: Secondary | ICD-10-CM | POA: Diagnosis not present

## 2023-06-11 ENCOUNTER — Other Ambulatory Visit: Payer: Self-pay

## 2023-06-11 DIAGNOSIS — N186 End stage renal disease: Secondary | ICD-10-CM

## 2023-06-11 DIAGNOSIS — N2581 Secondary hyperparathyroidism of renal origin: Secondary | ICD-10-CM | POA: Diagnosis not present

## 2023-06-11 DIAGNOSIS — D508 Other iron deficiency anemias: Secondary | ICD-10-CM | POA: Diagnosis not present

## 2023-06-11 DIAGNOSIS — R82998 Other abnormal findings in urine: Secondary | ICD-10-CM | POA: Diagnosis not present

## 2023-06-11 DIAGNOSIS — Z992 Dependence on renal dialysis: Secondary | ICD-10-CM | POA: Diagnosis not present

## 2023-06-11 DIAGNOSIS — D631 Anemia in chronic kidney disease: Secondary | ICD-10-CM | POA: Diagnosis not present

## 2023-06-14 DIAGNOSIS — R82998 Other abnormal findings in urine: Secondary | ICD-10-CM | POA: Diagnosis not present

## 2023-06-14 DIAGNOSIS — N2581 Secondary hyperparathyroidism of renal origin: Secondary | ICD-10-CM | POA: Diagnosis not present

## 2023-06-14 DIAGNOSIS — D508 Other iron deficiency anemias: Secondary | ICD-10-CM | POA: Diagnosis not present

## 2023-06-14 DIAGNOSIS — N186 End stage renal disease: Secondary | ICD-10-CM | POA: Diagnosis not present

## 2023-06-14 DIAGNOSIS — Z992 Dependence on renal dialysis: Secondary | ICD-10-CM | POA: Diagnosis not present

## 2023-06-14 DIAGNOSIS — D631 Anemia in chronic kidney disease: Secondary | ICD-10-CM | POA: Diagnosis not present

## 2023-06-18 DIAGNOSIS — N2581 Secondary hyperparathyroidism of renal origin: Secondary | ICD-10-CM | POA: Diagnosis not present

## 2023-06-18 DIAGNOSIS — N186 End stage renal disease: Secondary | ICD-10-CM | POA: Diagnosis not present

## 2023-06-18 DIAGNOSIS — R82998 Other abnormal findings in urine: Secondary | ICD-10-CM | POA: Diagnosis not present

## 2023-06-18 DIAGNOSIS — D508 Other iron deficiency anemias: Secondary | ICD-10-CM | POA: Diagnosis not present

## 2023-06-18 DIAGNOSIS — D631 Anemia in chronic kidney disease: Secondary | ICD-10-CM | POA: Diagnosis not present

## 2023-06-18 DIAGNOSIS — Z992 Dependence on renal dialysis: Secondary | ICD-10-CM | POA: Diagnosis not present

## 2023-06-21 DIAGNOSIS — Z992 Dependence on renal dialysis: Secondary | ICD-10-CM | POA: Diagnosis not present

## 2023-06-21 DIAGNOSIS — N2581 Secondary hyperparathyroidism of renal origin: Secondary | ICD-10-CM | POA: Diagnosis not present

## 2023-06-21 DIAGNOSIS — R82998 Other abnormal findings in urine: Secondary | ICD-10-CM | POA: Diagnosis not present

## 2023-06-21 DIAGNOSIS — N186 End stage renal disease: Secondary | ICD-10-CM | POA: Diagnosis not present

## 2023-06-21 DIAGNOSIS — D631 Anemia in chronic kidney disease: Secondary | ICD-10-CM | POA: Diagnosis not present

## 2023-06-21 DIAGNOSIS — D508 Other iron deficiency anemias: Secondary | ICD-10-CM | POA: Diagnosis not present

## 2023-06-21 NOTE — Progress Notes (Deleted)
 POST OPERATIVE OFFICE NOTE    CC:  F/u for surgery  HPI:  This is a 83 y.o. female who is s/p left BC AV fistula by Dr. Karin Lieu on 04/13/23.  She has a a TDC placed else where.  Her HD is M/F.  She states she has developed left forearm pain volar into the thumb and pointer finger 2 weeks after surgery.  She was seen on 04/22/23.  She described  sharp pain in the left side of her neck/shoulder that radiates down her arm and into her hand.   On exam her left arm fistula has a good thrill on palpation, however she is very sensitive to touch at this time -She had a 2+ left radial pulse.  She was encouraged to rest her arm.    She is here today for duplex to check for maturity via duplex.     Allergies  Allergen Reactions   Oxycodone Itching and Other (See Comments)    Pt still takes this med even thought it causes itching   Trelegy Ellipta [Fluticasone-Umeclidin-Vilant] Nausea And Vomiting, Other (See Comments) and Cough    Made the patient sick to her stomach (only) several days after using it    Trulicity [Dulaglutide] Nausea And Vomiting, Nausea Only, Other (See Comments) and Cough    TRULICITY made the patient sick to her stomach (only) several days after using it    Current Outpatient Medications  Medication Sig Dispense Refill   acetaminophen (TYLENOL) 500 MG tablet Take 1,000-1,500 mg by mouth every 8 (eight) hours as needed for mild pain, moderate pain or headache.     albuterol (PROVENTIL) (2.5 MG/3ML) 0.083% nebulizer solution Take 3 mLs (2.5 mg total) by nebulization every 2 (two) hours as needed for wheezing or shortness of breath. 75 mL 5   albuterol (VENTOLIN HFA) 108 (90 Base) MCG/ACT inhaler Inhale 2 puffs into the lungs every 6 (six) hours as needed for wheezing or shortness of breath. 3 each 3   Alcohol Swabs (B-D SINGLE USE SWABS REGULAR) PADS      allopurinol (ZYLOPRIM) 100 MG tablet Take 100 mg by mouth 2 (two) times daily.     amLODipine (NORVASC) 5 MG tablet Take 2  tablets (10 mg total) by mouth daily. (Patient taking differently: Take 5 mg by mouth daily.)     apixaban (ELIQUIS) 2.5 MG TABS tablet Take 1 tablet (2.5 mg total) by mouth 2 (two) times daily. 180 tablet 3   ascorbic acid (VITAMIN C) 500 MG tablet Take 500 mg by mouth daily with lunch.     atorvastatin (LIPITOR) 40 MG tablet Take 1 tablet (40 mg total) by mouth daily. 90 tablet 3   azithromycin (ZITHROMAX Z-PAK) 250 MG tablet Take 2 tablets day 1 and then 1 daily for 4 days 6 each 0   Blood Glucose Calibration (TRUE METRIX LEVEL 1) Low SOLN      Budeson-Glycopyrrol-Formoterol (BREZTRI AEROSPHERE) 160-9-4.8 MCG/ACT AERO Inhale 2 puffs into the lungs in the morning and at bedtime. 3 each 10   Carboxymethylcellulose Sodium (ARTIFICIAL TEARS OP) Place 1 drop into both eyes daily as needed (dry eyes).     Doxercalciferol (HECTOROL IV) Doxercalciferol (Hectorol)     escitalopram (LEXAPRO) 5 MG tablet Take 1 tablet (5 mg total) by mouth daily. 30 tablet 0   ferrous sulfate 325 (65 FE) MG tablet Take 1 tablet (325 mg total) by mouth daily with breakfast. 90 tablet 3   fluticasone (FLONASE) 50 MCG/ACT nasal spray USE  2 SPRAYS NASALLY DAILY (Patient taking differently: Place 1 spray into both nostrils daily as needed for allergies.) 48 g 1   gabapentin (NEURONTIN) 800 MG tablet Take 800 mg by mouth 3 (three) times daily.     guaiFENesin (ROBITUSSIN) 100 MG/5ML liquid Take 5 mLs by mouth every 4 (four) hours as needed for cough or to loosen phlegm.     HYDROcodone-acetaminophen (NORCO) 10-325 MG tablet Take 1 tablet by mouth every 6 (six) hours as needed. (Patient taking differently: Take 1 tablet by mouth every 6 (six) hours as needed for severe pain (pain score 7-10).) 10 tablet 0   HYDROXYZINE HCL PO Take 25 mg by mouth at bedtime as needed (insomnia).     insulin aspart (NOVOLOG) 100 UNIT/ML FlexPen Inject 0-20 Units into the skin See admin instructions. Inject 0-20 units into the skin three times a day  before meals "as needed," per SLIDING SCALE     isosorbide mononitrate (IMDUR) 60 MG 24 hr tablet Take 1 tablet (60 mg total) by mouth daily. 90 tablet 3   levothyroxine (SYNTHROID) 200 MCG tablet Take 200 mcg by mouth daily before breakfast.     loratadine (CLARITIN) 10 MG tablet Take 1 tablet (10 mg total) by mouth daily.     melatonin 5 MG TABS Take 1 tablet (5 mg total) by mouth at bedtime as needed.  0   Methoxy PEG-Epoetin Beta (MIRCERA IJ) Mircera     multivitamin (RENA-VIT) TABS tablet Take 1 tablet by mouth at bedtime. 30 tablet 0   nitroGLYCERIN (NITROSTAT) 0.4 MG SL tablet Place 1 tablet (0.4 mg total) under the tongue every 5 (five) minutes as needed for chest pain. 25 tablet 4   polyethylene glycol (MIRALAX / GLYCOLAX) 17 g packet Take 17 g by mouth daily as needed for mild constipation or moderate constipation.  0   predniSONE (DELTASONE) 20 MG tablet Take 1 tablet (20 mg total) by mouth daily with breakfast. 7 tablet 0   PRESCRIPTION MEDICATION CPAP- At bedtime and as needed during any time of rest     promethazine (PHENERGAN) 25 MG suppository SMARTSIG:0.5-1 SUPPOS Rectally Every 6 Hours PRN     sevelamer carbonate (RENVELA) 800 MG tablet Take 1 tablet (800 mg total) by mouth 3 (three) times daily with meals. 90 tablet 0   torsemide (DEMADEX) 100 MG tablet Take 1 tablet (100 mg total) by mouth 2 (two) times daily. 60 tablet 0   TRUE METRIX BLOOD GLUCOSE TEST test strip      TRUEplus Lancets 28G MISC      vitamin B-12 (CYANOCOBALAMIN) 1000 MCG tablet Take 1,000 mcg by mouth daily with lunch.     Vitamin D3 (VITAMIN D) 25 MCG tablet Take 1,000 Units by mouth daily with lunch.     No current facility-administered medications for this visit.     ROS:  See HPI  Physical Exam:  Findings:  +--------------------+----------+-----------------+--------+  AVF                PSV (cm/s)Flow Vol (mL/min)Comments  +--------------------+----------+-----------------+--------+   Native artery inflow   222           629                 +--------------------+----------+-----------------+--------+  AVF Anastomosis        262                               +--------------------+----------+-----------------+--------+     +------------+----------+-------------+----------+--------+  OUTFLOW VEINPSV (cm/s)Diameter (cm)Depth (cm)Describe  +------------+----------+-------------+----------+--------+  Prox UA        175        0.58        0.83             +------------+----------+-------------+----------+--------+  Mid UA         124        0.68        0.43   tortuous  +------------+----------+-------------+----------+--------+  Dist UA        383        0.74        0.54   tortuous  +------------+----------+-------------+----------+--------+  AC Fossa       122        0.95        0.24             +------------+----------+-------------+----------+--------+     +--------------------+-------------+----------+---------+----------+-------  --+                     Diameter (cm)Depth (cm)BranchingPSV (cm/s)            +--------------------+-------------+----------+---------+----------+-------  --+  distal radial artery                                    98     antegrade  +--------------------+-------------+----------+---------+----------+-------  --+  distal ulnar artery                                     35     antegrade  +--------------------+-------------+----------+---------+----------+-------  --+     Summary:  Patent AVF.  Outflow vein is tortuous.  Flow volume is 629 ml/min.  No areas of stenosis or thrombus.  No significant branching.  Antegrade distal radial and ulnar arteries.      Incision:  well healed  Extremities:  palpable radial pulses 2+, palpable thrill in fistula, brisk cap refill Neuro: decreased sensation to touch left thumb verses right Pain distribution to thumb and pointer  finger.    Assessment/Plan:  This is a 83 y.o. female who is s/p:Left BC AV fistula creation  Her pain distribution is in the medial nerve and possible carpel tunnel syndrome.  The pain is now below the elbow.  I will encourage her to use her left UE to improve flow volume in the fistula.  Currently the vol is 629 ml/min.  The fistula size is maturing welll and the depth is < 0.6cm.  As far as her hand symptoms I showed her an example of a carpal tunnel brace to get  to rest her wrist.  I do not feel these symptoms are from steal.     -She will f/u in mid Feb for repeat duplex and exam.     Victorino Sparrow PA-C [facility] [phone]   Call MD:  Chestine Spore

## 2023-06-24 ENCOUNTER — Ambulatory Visit: Payer: Medicare Other | Admitting: Vascular Surgery

## 2023-06-24 ENCOUNTER — Ambulatory Visit (HOSPITAL_COMMUNITY): Payer: Medicare Other

## 2023-06-25 DIAGNOSIS — R82998 Other abnormal findings in urine: Secondary | ICD-10-CM | POA: Diagnosis not present

## 2023-06-25 DIAGNOSIS — D631 Anemia in chronic kidney disease: Secondary | ICD-10-CM | POA: Diagnosis not present

## 2023-06-25 DIAGNOSIS — N2581 Secondary hyperparathyroidism of renal origin: Secondary | ICD-10-CM | POA: Diagnosis not present

## 2023-06-25 DIAGNOSIS — Z992 Dependence on renal dialysis: Secondary | ICD-10-CM | POA: Diagnosis not present

## 2023-06-25 DIAGNOSIS — N186 End stage renal disease: Secondary | ICD-10-CM | POA: Diagnosis not present

## 2023-06-25 DIAGNOSIS — D508 Other iron deficiency anemias: Secondary | ICD-10-CM | POA: Diagnosis not present

## 2023-06-28 DIAGNOSIS — Z992 Dependence on renal dialysis: Secondary | ICD-10-CM | POA: Diagnosis not present

## 2023-06-28 DIAGNOSIS — N186 End stage renal disease: Secondary | ICD-10-CM | POA: Diagnosis not present

## 2023-06-28 DIAGNOSIS — D631 Anemia in chronic kidney disease: Secondary | ICD-10-CM | POA: Diagnosis not present

## 2023-06-28 DIAGNOSIS — N2581 Secondary hyperparathyroidism of renal origin: Secondary | ICD-10-CM | POA: Diagnosis not present

## 2023-06-28 DIAGNOSIS — D508 Other iron deficiency anemias: Secondary | ICD-10-CM | POA: Diagnosis not present

## 2023-06-28 DIAGNOSIS — R82998 Other abnormal findings in urine: Secondary | ICD-10-CM | POA: Diagnosis not present

## 2023-07-01 DIAGNOSIS — E349 Endocrine disorder, unspecified: Secondary | ICD-10-CM | POA: Diagnosis not present

## 2023-07-01 DIAGNOSIS — M25569 Pain in unspecified knee: Secondary | ICD-10-CM | POA: Diagnosis not present

## 2023-07-01 DIAGNOSIS — M353 Polymyalgia rheumatica: Secondary | ICD-10-CM | POA: Diagnosis not present

## 2023-07-01 DIAGNOSIS — Z79891 Long term (current) use of opiate analgesic: Secondary | ICD-10-CM | POA: Diagnosis not present

## 2023-07-01 DIAGNOSIS — M1710 Unilateral primary osteoarthritis, unspecified knee: Secondary | ICD-10-CM | POA: Diagnosis not present

## 2023-07-01 DIAGNOSIS — G894 Chronic pain syndrome: Secondary | ICD-10-CM | POA: Diagnosis not present

## 2023-07-01 DIAGNOSIS — M8580 Other specified disorders of bone density and structure, unspecified site: Secondary | ICD-10-CM | POA: Diagnosis not present

## 2023-07-02 ENCOUNTER — Ambulatory Visit: Payer: Medicare HMO | Admitting: Cardiology

## 2023-07-02 DIAGNOSIS — D508 Other iron deficiency anemias: Secondary | ICD-10-CM | POA: Diagnosis not present

## 2023-07-02 DIAGNOSIS — D631 Anemia in chronic kidney disease: Secondary | ICD-10-CM | POA: Diagnosis not present

## 2023-07-02 DIAGNOSIS — R82998 Other abnormal findings in urine: Secondary | ICD-10-CM | POA: Diagnosis not present

## 2023-07-02 DIAGNOSIS — N179 Acute kidney failure, unspecified: Secondary | ICD-10-CM | POA: Diagnosis not present

## 2023-07-02 DIAGNOSIS — N2581 Secondary hyperparathyroidism of renal origin: Secondary | ICD-10-CM | POA: Diagnosis not present

## 2023-07-02 DIAGNOSIS — Z992 Dependence on renal dialysis: Secondary | ICD-10-CM | POA: Diagnosis not present

## 2023-07-02 DIAGNOSIS — N186 End stage renal disease: Secondary | ICD-10-CM | POA: Diagnosis not present

## 2023-07-05 DIAGNOSIS — D631 Anemia in chronic kidney disease: Secondary | ICD-10-CM | POA: Diagnosis not present

## 2023-07-05 DIAGNOSIS — Z992 Dependence on renal dialysis: Secondary | ICD-10-CM | POA: Diagnosis not present

## 2023-07-05 DIAGNOSIS — N186 End stage renal disease: Secondary | ICD-10-CM | POA: Diagnosis not present

## 2023-07-05 DIAGNOSIS — N2581 Secondary hyperparathyroidism of renal origin: Secondary | ICD-10-CM | POA: Diagnosis not present

## 2023-07-06 DIAGNOSIS — J449 Chronic obstructive pulmonary disease, unspecified: Secondary | ICD-10-CM | POA: Diagnosis not present

## 2023-07-06 DIAGNOSIS — N184 Chronic kidney disease, stage 4 (severe): Secondary | ICD-10-CM | POA: Diagnosis not present

## 2023-07-09 DIAGNOSIS — N186 End stage renal disease: Secondary | ICD-10-CM | POA: Diagnosis not present

## 2023-07-09 DIAGNOSIS — D631 Anemia in chronic kidney disease: Secondary | ICD-10-CM | POA: Diagnosis not present

## 2023-07-09 DIAGNOSIS — N2581 Secondary hyperparathyroidism of renal origin: Secondary | ICD-10-CM | POA: Diagnosis not present

## 2023-07-09 DIAGNOSIS — Z992 Dependence on renal dialysis: Secondary | ICD-10-CM | POA: Diagnosis not present

## 2023-07-12 DIAGNOSIS — N2581 Secondary hyperparathyroidism of renal origin: Secondary | ICD-10-CM | POA: Diagnosis not present

## 2023-07-12 DIAGNOSIS — N186 End stage renal disease: Secondary | ICD-10-CM | POA: Diagnosis not present

## 2023-07-12 DIAGNOSIS — D631 Anemia in chronic kidney disease: Secondary | ICD-10-CM | POA: Diagnosis not present

## 2023-07-12 DIAGNOSIS — Z992 Dependence on renal dialysis: Secondary | ICD-10-CM | POA: Diagnosis not present

## 2023-07-16 DIAGNOSIS — D631 Anemia in chronic kidney disease: Secondary | ICD-10-CM | POA: Diagnosis not present

## 2023-07-16 DIAGNOSIS — N186 End stage renal disease: Secondary | ICD-10-CM | POA: Diagnosis not present

## 2023-07-16 DIAGNOSIS — Z992 Dependence on renal dialysis: Secondary | ICD-10-CM | POA: Diagnosis not present

## 2023-07-16 DIAGNOSIS — N2581 Secondary hyperparathyroidism of renal origin: Secondary | ICD-10-CM | POA: Diagnosis not present

## 2023-07-19 DIAGNOSIS — D631 Anemia in chronic kidney disease: Secondary | ICD-10-CM | POA: Diagnosis not present

## 2023-07-19 DIAGNOSIS — N2581 Secondary hyperparathyroidism of renal origin: Secondary | ICD-10-CM | POA: Diagnosis not present

## 2023-07-19 DIAGNOSIS — Z992 Dependence on renal dialysis: Secondary | ICD-10-CM | POA: Diagnosis not present

## 2023-07-19 DIAGNOSIS — N186 End stage renal disease: Secondary | ICD-10-CM | POA: Diagnosis not present

## 2023-07-20 DIAGNOSIS — E113213 Type 2 diabetes mellitus with mild nonproliferative diabetic retinopathy with macular edema, bilateral: Secondary | ICD-10-CM | POA: Diagnosis not present

## 2023-07-20 DIAGNOSIS — H43813 Vitreous degeneration, bilateral: Secondary | ICD-10-CM | POA: Diagnosis not present

## 2023-07-20 DIAGNOSIS — H35033 Hypertensive retinopathy, bilateral: Secondary | ICD-10-CM | POA: Diagnosis not present

## 2023-07-20 DIAGNOSIS — H353132 Nonexudative age-related macular degeneration, bilateral, intermediate dry stage: Secondary | ICD-10-CM | POA: Diagnosis not present

## 2023-07-20 DIAGNOSIS — Z961 Presence of intraocular lens: Secondary | ICD-10-CM | POA: Diagnosis not present

## 2023-07-21 DIAGNOSIS — Z992 Dependence on renal dialysis: Secondary | ICD-10-CM | POA: Diagnosis not present

## 2023-07-21 DIAGNOSIS — J449 Chronic obstructive pulmonary disease, unspecified: Secondary | ICD-10-CM | POA: Diagnosis not present

## 2023-07-21 DIAGNOSIS — N186 End stage renal disease: Secondary | ICD-10-CM | POA: Diagnosis not present

## 2023-07-21 DIAGNOSIS — E1140 Type 2 diabetes mellitus with diabetic neuropathy, unspecified: Secondary | ICD-10-CM | POA: Diagnosis not present

## 2023-07-21 DIAGNOSIS — N2581 Secondary hyperparathyroidism of renal origin: Secondary | ICD-10-CM | POA: Diagnosis not present

## 2023-07-21 DIAGNOSIS — I5030 Unspecified diastolic (congestive) heart failure: Secondary | ICD-10-CM | POA: Diagnosis not present

## 2023-07-21 DIAGNOSIS — Z23 Encounter for immunization: Secondary | ICD-10-CM | POA: Diagnosis not present

## 2023-07-21 DIAGNOSIS — I480 Paroxysmal atrial fibrillation: Secondary | ICD-10-CM | POA: Diagnosis not present

## 2023-07-21 DIAGNOSIS — Z794 Long term (current) use of insulin: Secondary | ICD-10-CM | POA: Diagnosis not present

## 2023-07-21 DIAGNOSIS — E790 Hyperuricemia without signs of inflammatory arthritis and tophaceous disease: Secondary | ICD-10-CM | POA: Diagnosis not present

## 2023-07-21 DIAGNOSIS — E890 Postprocedural hypothyroidism: Secondary | ICD-10-CM | POA: Diagnosis not present

## 2023-07-21 DIAGNOSIS — I495 Sick sinus syndrome: Secondary | ICD-10-CM | POA: Diagnosis not present

## 2023-07-21 DIAGNOSIS — E782 Mixed hyperlipidemia: Secondary | ICD-10-CM | POA: Diagnosis not present

## 2023-07-23 DIAGNOSIS — N186 End stage renal disease: Secondary | ICD-10-CM | POA: Diagnosis not present

## 2023-07-23 DIAGNOSIS — D631 Anemia in chronic kidney disease: Secondary | ICD-10-CM | POA: Diagnosis not present

## 2023-07-23 DIAGNOSIS — N2581 Secondary hyperparathyroidism of renal origin: Secondary | ICD-10-CM | POA: Diagnosis not present

## 2023-07-23 DIAGNOSIS — Z992 Dependence on renal dialysis: Secondary | ICD-10-CM | POA: Diagnosis not present

## 2023-07-26 DIAGNOSIS — Z992 Dependence on renal dialysis: Secondary | ICD-10-CM | POA: Diagnosis not present

## 2023-07-26 DIAGNOSIS — N2581 Secondary hyperparathyroidism of renal origin: Secondary | ICD-10-CM | POA: Diagnosis not present

## 2023-07-26 DIAGNOSIS — N186 End stage renal disease: Secondary | ICD-10-CM | POA: Diagnosis not present

## 2023-07-26 DIAGNOSIS — D631 Anemia in chronic kidney disease: Secondary | ICD-10-CM | POA: Diagnosis not present

## 2023-07-29 DIAGNOSIS — Z79891 Long term (current) use of opiate analgesic: Secondary | ICD-10-CM | POA: Diagnosis not present

## 2023-07-29 DIAGNOSIS — M549 Dorsalgia, unspecified: Secondary | ICD-10-CM | POA: Diagnosis not present

## 2023-07-29 DIAGNOSIS — M1710 Unilateral primary osteoarthritis, unspecified knee: Secondary | ICD-10-CM | POA: Diagnosis not present

## 2023-07-29 DIAGNOSIS — E349 Endocrine disorder, unspecified: Secondary | ICD-10-CM | POA: Diagnosis not present

## 2023-07-29 DIAGNOSIS — M8580 Other specified disorders of bone density and structure, unspecified site: Secondary | ICD-10-CM | POA: Diagnosis not present

## 2023-07-29 DIAGNOSIS — M353 Polymyalgia rheumatica: Secondary | ICD-10-CM | POA: Diagnosis not present

## 2023-07-29 DIAGNOSIS — M25569 Pain in unspecified knee: Secondary | ICD-10-CM | POA: Diagnosis not present

## 2023-07-29 DIAGNOSIS — G894 Chronic pain syndrome: Secondary | ICD-10-CM | POA: Diagnosis not present

## 2023-07-30 DIAGNOSIS — Z992 Dependence on renal dialysis: Secondary | ICD-10-CM | POA: Diagnosis not present

## 2023-07-30 DIAGNOSIS — N2581 Secondary hyperparathyroidism of renal origin: Secondary | ICD-10-CM | POA: Diagnosis not present

## 2023-07-30 DIAGNOSIS — N186 End stage renal disease: Secondary | ICD-10-CM | POA: Diagnosis not present

## 2023-07-30 DIAGNOSIS — D631 Anemia in chronic kidney disease: Secondary | ICD-10-CM | POA: Diagnosis not present

## 2023-08-02 DIAGNOSIS — N2581 Secondary hyperparathyroidism of renal origin: Secondary | ICD-10-CM | POA: Diagnosis not present

## 2023-08-02 DIAGNOSIS — D631 Anemia in chronic kidney disease: Secondary | ICD-10-CM | POA: Diagnosis not present

## 2023-08-02 DIAGNOSIS — N179 Acute kidney failure, unspecified: Secondary | ICD-10-CM | POA: Diagnosis not present

## 2023-08-02 DIAGNOSIS — Z992 Dependence on renal dialysis: Secondary | ICD-10-CM | POA: Diagnosis not present

## 2023-08-02 DIAGNOSIS — N186 End stage renal disease: Secondary | ICD-10-CM | POA: Diagnosis not present

## 2023-08-04 NOTE — Progress Notes (Unsigned)
 POST OPERATIVE OFFICE NOTE    CC:  F/u for surgery  HPI:  This is a 83 y.o. female who is s/p left BC AV fistula by Dr. Karin Lieu on 04/13/23.  She has a a TDC placed else where.  Her HD is M/F.  She states she has developed left forearm pain volar into the thumb and pointer finger 2 weeks after surgery.  She was seen on 04/22/23.  She described  sharp pain in the left side of her neck/shoulder that radiates down her arm and into her hand.   On exam her left arm fistula has a good thrill on palpation, however she is very sensitive to touch at this time -She had a 2+ left radial pulse.  She was encouraged to rest her arm.    She is here today for duplex to check for maturity via duplex.     Allergies  Allergen Reactions   Oxycodone Itching and Other (See Comments)    Pt still takes this med even thought it causes itching   Trelegy Ellipta [Fluticasone-Umeclidin-Vilant] Nausea And Vomiting, Other (See Comments) and Cough    Made the patient sick to her stomach (only) several days after using it    Trulicity [Dulaglutide] Nausea And Vomiting, Nausea Only, Other (See Comments) and Cough    TRULICITY made the patient sick to her stomach (only) several days after using it    Current Outpatient Medications  Medication Sig Dispense Refill   acetaminophen (TYLENOL) 500 MG tablet Take 1,000-1,500 mg by mouth every 8 (eight) hours as needed for mild pain, moderate pain or headache.     albuterol (PROVENTIL) (2.5 MG/3ML) 0.083% nebulizer solution Take 3 mLs (2.5 mg total) by nebulization every 2 (two) hours as needed for wheezing or shortness of breath. 75 mL 5   albuterol (VENTOLIN HFA) 108 (90 Base) MCG/ACT inhaler Inhale 2 puffs into the lungs every 6 (six) hours as needed for wheezing or shortness of breath. 3 each 3   Alcohol Swabs (B-D SINGLE USE SWABS REGULAR) PADS      allopurinol (ZYLOPRIM) 100 MG tablet Take 100 mg by mouth 2 (two) times daily.     amLODipine (NORVASC) 5 MG tablet Take 2  tablets (10 mg total) by mouth daily. (Patient taking differently: Take 5 mg by mouth daily.)     apixaban (ELIQUIS) 2.5 MG TABS tablet Take 1 tablet (2.5 mg total) by mouth 2 (two) times daily. 180 tablet 3   ascorbic acid (VITAMIN C) 500 MG tablet Take 500 mg by mouth daily with lunch.     atorvastatin (LIPITOR) 40 MG tablet Take 1 tablet (40 mg total) by mouth daily. 90 tablet 3   azithromycin (ZITHROMAX Z-PAK) 250 MG tablet Take 2 tablets day 1 and then 1 daily for 4 days 6 each 0   Blood Glucose Calibration (TRUE METRIX LEVEL 1) Low SOLN      Budeson-Glycopyrrol-Formoterol (BREZTRI AEROSPHERE) 160-9-4.8 MCG/ACT AERO Inhale 2 puffs into the lungs in the morning and at bedtime. 3 each 10   Carboxymethylcellulose Sodium (ARTIFICIAL TEARS OP) Place 1 drop into both eyes daily as needed (dry eyes).     Doxercalciferol (HECTOROL IV) Doxercalciferol (Hectorol)     escitalopram (LEXAPRO) 5 MG tablet Take 1 tablet (5 mg total) by mouth daily. 30 tablet 0   ferrous sulfate 325 (65 FE) MG tablet Take 1 tablet (325 mg total) by mouth daily with breakfast. 90 tablet 3   fluticasone (FLONASE) 50 MCG/ACT nasal spray USE  2 SPRAYS NASALLY DAILY (Patient taking differently: Place 1 spray into both nostrils daily as needed for allergies.) 48 g 1   gabapentin (NEURONTIN) 800 MG tablet Take 800 mg by mouth 3 (three) times daily.     guaiFENesin (ROBITUSSIN) 100 MG/5ML liquid Take 5 mLs by mouth every 4 (four) hours as needed for cough or to loosen phlegm.     HYDROcodone-acetaminophen (NORCO) 10-325 MG tablet Take 1 tablet by mouth every 6 (six) hours as needed. (Patient taking differently: Take 1 tablet by mouth every 6 (six) hours as needed for severe pain (pain score 7-10).) 10 tablet 0   HYDROXYZINE HCL PO Take 25 mg by mouth at bedtime as needed (insomnia).     insulin aspart (NOVOLOG) 100 UNIT/ML FlexPen Inject 0-20 Units into the skin See admin instructions. Inject 0-20 units into the skin three times a day  before meals "as needed," per SLIDING SCALE     isosorbide mononitrate (IMDUR) 60 MG 24 hr tablet Take 1 tablet (60 mg total) by mouth daily. 90 tablet 3   levothyroxine (SYNTHROID) 200 MCG tablet Take 200 mcg by mouth daily before breakfast.     loratadine (CLARITIN) 10 MG tablet Take 1 tablet (10 mg total) by mouth daily.     melatonin 5 MG TABS Take 1 tablet (5 mg total) by mouth at bedtime as needed.  0   Methoxy PEG-Epoetin Beta (MIRCERA IJ) Mircera     multivitamin (RENA-VIT) TABS tablet Take 1 tablet by mouth at bedtime. 30 tablet 0   nitroGLYCERIN (NITROSTAT) 0.4 MG SL tablet Place 1 tablet (0.4 mg total) under the tongue every 5 (five) minutes as needed for chest pain. 25 tablet 4   polyethylene glycol (MIRALAX / GLYCOLAX) 17 g packet Take 17 g by mouth daily as needed for mild constipation or moderate constipation.  0   predniSONE (DELTASONE) 20 MG tablet Take 1 tablet (20 mg total) by mouth daily with breakfast. 7 tablet 0   PRESCRIPTION MEDICATION CPAP- At bedtime and as needed during any time of rest     promethazine (PHENERGAN) 25 MG suppository SMARTSIG:0.5-1 SUPPOS Rectally Every 6 Hours PRN     sevelamer carbonate (RENVELA) 800 MG tablet Take 1 tablet (800 mg total) by mouth 3 (three) times daily with meals. 90 tablet 0   torsemide (DEMADEX) 100 MG tablet Take 1 tablet (100 mg total) by mouth 2 (two) times daily. 60 tablet 0   TRUE METRIX BLOOD GLUCOSE TEST test strip      TRUEplus Lancets 28G MISC      vitamin B-12 (CYANOCOBALAMIN) 1000 MCG tablet Take 1,000 mcg by mouth daily with lunch.     Vitamin D3 (VITAMIN D) 25 MCG tablet Take 1,000 Units by mouth daily with lunch.     No current facility-administered medications for this visit.     ROS:  See HPI  Physical Exam:  Findings:  +--------------------+----------+-----------------+--------+  AVF                PSV (cm/s)Flow Vol (mL/min)Comments  +--------------------+----------+-----------------+--------+   Native artery inflow   222           629                 +--------------------+----------+-----------------+--------+  AVF Anastomosis        262                               +--------------------+----------+-----------------+--------+     +------------+----------+-------------+----------+--------+  OUTFLOW VEINPSV (cm/s)Diameter (cm)Depth (cm)Describe  +------------+----------+-------------+----------+--------+  Prox UA        175        0.58        0.83             +------------+----------+-------------+----------+--------+  Mid UA         124        0.68        0.43   tortuous  +------------+----------+-------------+----------+--------+  Dist UA        383        0.74        0.54   tortuous  +------------+----------+-------------+----------+--------+  AC Fossa       122        0.95        0.24             +------------+----------+-------------+----------+--------+     +--------------------+-------------+----------+---------+----------+-------  --+                     Diameter (cm)Depth (cm)BranchingPSV (cm/s)            +--------------------+-------------+----------+---------+----------+-------  --+  distal radial artery                                    98     antegrade  +--------------------+-------------+----------+---------+----------+-------  --+  distal ulnar artery                                     35     antegrade  +--------------------+-------------+----------+---------+----------+-------  --+     Summary:  Patent AVF.  Outflow vein is tortuous.  Flow volume is 629 ml/min.  No areas of stenosis or thrombus.  No significant branching.  Antegrade distal radial and ulnar arteries.      Incision:  well healed  Extremities:  palpable radial pulses 2+, palpable thrill in fistula, brisk cap refill Neuro: decreased sensation to touch left thumb verses right Pain distribution to thumb and pointer  finger.    Assessment/Plan:  This is a 83 y.o. female who is s/p:Left BC AV fistula creation  Her pain distribution is in the medial nerve and possible carpel tunnel syndrome.  The pain is now below the elbow.  I will encourage her to use her left UE to improve flow volume in the fistula.  Currently the vol is 629 ml/min.  The fistula size is maturing welll and the depth is < 0.6cm.  As far as her hand symptoms I showed her an example of a carpal tunnel brace to get  to rest her wrist.  I do not feel these symptoms are from steal.     -She will f/u in mid Feb for repeat duplex and exam.     Victorino Sparrow PA-C [facility] [phone]   Call MD:  Chestine Spore

## 2023-08-05 ENCOUNTER — Ambulatory Visit (INDEPENDENT_AMBULATORY_CARE_PROVIDER_SITE_OTHER): Admitting: Vascular Surgery

## 2023-08-05 ENCOUNTER — Encounter: Payer: Self-pay | Admitting: Vascular Surgery

## 2023-08-05 ENCOUNTER — Ambulatory Visit (HOSPITAL_COMMUNITY)
Admission: RE | Admit: 2023-08-05 | Discharge: 2023-08-05 | Disposition: A | Discharge status: Home / Self Care | Source: Ambulatory Visit | Attending: Vascular Surgery | Admitting: Vascular Surgery

## 2023-08-05 VITALS — BP 165/54 | HR 59 | Temp 97.8°F

## 2023-08-05 DIAGNOSIS — M79642 Pain in left hand: Secondary | ICD-10-CM | POA: Diagnosis not present

## 2023-08-05 DIAGNOSIS — Z992 Dependence on renal dialysis: Secondary | ICD-10-CM | POA: Diagnosis not present

## 2023-08-05 DIAGNOSIS — N186 End stage renal disease: Secondary | ICD-10-CM | POA: Insufficient documentation

## 2023-08-06 DIAGNOSIS — E1122 Type 2 diabetes mellitus with diabetic chronic kidney disease: Secondary | ICD-10-CM | POA: Diagnosis not present

## 2023-08-06 DIAGNOSIS — D631 Anemia in chronic kidney disease: Secondary | ICD-10-CM | POA: Diagnosis not present

## 2023-08-06 DIAGNOSIS — N2581 Secondary hyperparathyroidism of renal origin: Secondary | ICD-10-CM | POA: Diagnosis not present

## 2023-08-06 DIAGNOSIS — Z992 Dependence on renal dialysis: Secondary | ICD-10-CM | POA: Diagnosis not present

## 2023-08-06 DIAGNOSIS — R269 Unspecified abnormalities of gait and mobility: Secondary | ICD-10-CM | POA: Diagnosis not present

## 2023-08-06 DIAGNOSIS — N186 End stage renal disease: Secondary | ICD-10-CM | POA: Diagnosis not present

## 2023-08-09 DIAGNOSIS — D631 Anemia in chronic kidney disease: Secondary | ICD-10-CM | POA: Diagnosis not present

## 2023-08-09 DIAGNOSIS — E1122 Type 2 diabetes mellitus with diabetic chronic kidney disease: Secondary | ICD-10-CM | POA: Diagnosis not present

## 2023-08-09 DIAGNOSIS — N186 End stage renal disease: Secondary | ICD-10-CM | POA: Diagnosis not present

## 2023-08-09 DIAGNOSIS — N2581 Secondary hyperparathyroidism of renal origin: Secondary | ICD-10-CM | POA: Diagnosis not present

## 2023-08-09 DIAGNOSIS — Z992 Dependence on renal dialysis: Secondary | ICD-10-CM | POA: Diagnosis not present

## 2023-08-11 ENCOUNTER — Ambulatory Visit (INDEPENDENT_AMBULATORY_CARE_PROVIDER_SITE_OTHER)

## 2023-08-11 ENCOUNTER — Encounter: Payer: Self-pay | Admitting: Podiatry

## 2023-08-11 ENCOUNTER — Ambulatory Visit: Admitting: Podiatry

## 2023-08-11 DIAGNOSIS — M79676 Pain in unspecified toe(s): Secondary | ICD-10-CM

## 2023-08-11 DIAGNOSIS — M7751 Other enthesopathy of right foot: Secondary | ICD-10-CM | POA: Diagnosis not present

## 2023-08-11 DIAGNOSIS — M2041 Other hammer toe(s) (acquired), right foot: Secondary | ICD-10-CM | POA: Diagnosis not present

## 2023-08-11 DIAGNOSIS — B351 Tinea unguium: Secondary | ICD-10-CM

## 2023-08-11 DIAGNOSIS — Z89422 Acquired absence of other left toe(s): Secondary | ICD-10-CM

## 2023-08-11 DIAGNOSIS — E1142 Type 2 diabetes mellitus with diabetic polyneuropathy: Secondary | ICD-10-CM | POA: Diagnosis not present

## 2023-08-11 DIAGNOSIS — M2042 Other hammer toe(s) (acquired), left foot: Secondary | ICD-10-CM | POA: Diagnosis not present

## 2023-08-11 DIAGNOSIS — G629 Polyneuropathy, unspecified: Secondary | ICD-10-CM

## 2023-08-11 DIAGNOSIS — E119 Type 2 diabetes mellitus without complications: Secondary | ICD-10-CM | POA: Diagnosis not present

## 2023-08-11 NOTE — Progress Notes (Addendum)
 Subjective:  Patient ID: Kayla [name], female    DOB: 1940/12/02,   MRN: [id number]  Chief Complaint  Patient presents with   Nail Problem    Pt presents for pain in her right foot states she is been having pain between her great toe and 2nd     83 y.o. female presents for concern of right foot pain. Relates sensation of walking on pebble in shoe. Relates burning tingling and numbness in her feet. Takes gabapentin. History of left hallux and fourth digit amputation. Also concern of thickened elongated and painful nails that are difficult to trim. Requesting to have them trimmed today. Relates burning and tingling in their feet. Patient is diabetic and last A1c was  Lab Results  Component Value Date   HGBA1C 6.8 (H) 09/04/2022   .   PCP:  Hamrick, Durward Fortes, MD    . Denies any other pedal complaints. Denies n/v/f/c.   Past Medical History:  Diagnosis Date   Allergy    Anxiety    Arthritis    CAD (coronary artery disease)    Mild per remote cath in 2006, /    Nuclear, January, 2013, no significant abnormality,   Carotid artery disease (HCC)    Doppler, January, 2013, 0 39% RIC A., 40-59% LICA, stable   Cataract    CHF (congestive heart failure) (HCC)    Chronic kidney disease    Complication of anesthesia    wakes up "mean"   COPD 02/16/2007   Intolerant of Spiriva, tudorza Intolerant of Trelegy    COPD (chronic obstructive pulmonary disease) (HCC)    Coronary atherosclerosis 11/24/2008   Catheterization 2006, nonobstructive coronary disease   //   nuclear 2013, low risk     Diabetes mellitus, type 2 (HCC)    Diverticula of colon    DIVERTICULOSIS OF COLON 03/23/2007   Qualifier: Diagnosis of  By: Craige Cotta MD, Vineet     Dizziness    occasional   Dizzy spells 05/04/2011   DM (diabetes mellitus) (HCC) 03/23/2007   Qualifier: Diagnosis of  By: Craige Cotta MD, Vineet     Dyspnea    On exertion   Ejection fraction    Essential hypertension 02/16/2007   Qualifier: Diagnosis of   By: Earlene Plater CMA, Tammy     G E R D 03/23/2007   Qualifier: Diagnosis of  By: Craige Cotta MD, Vineet     Gall bladder disease 04/28/2016   Gastritis and gastroduodenitis    GERD (gastroesophageal reflux disease)    Goiter    hx of   Headache(784.0)    migraine   Hyperlipidemia    Hypertension    Hypothyroidism    HYPOTHYROIDISM, POSTSURGICAL 06/04/2008   Qualifier: Diagnosis of  By: Everardo All MD, Sean A    Left ventricular hypertrophy    Leg edema    occasional swelling   Leg swelling 12/09/2018   Myofascial pain syndrome, cervical 01/06/2018   Obesity    OBESITY 11/24/2008   Qualifier: Diagnosis of  By: Vikki Ports     OBSTRUCTIVE SLEEP APNEA 02/16/2007   Auto CPAP 09/03/13 to 10/02/13 >> used on 30 of 30 nights with average 6 hrs and 3 min.  Average AHI is 3.1 with median CPAP 5 cm H2O and 95 th percentile CPAP 6 cm H2O.     Occipital neuralgia of right side 01/06/2018   OSA (obstructive sleep apnea)    cpap setting of 12   Pain in joint of right hip 01/21/2018  Palpitations 01/21/2011   48 hour Holter, 2015, scattered PACs and PVCs.    Paroxysmal atrial fibrillation (HCC)    Pneumonia    PONV (postoperative nausea and vomiting)    severe after every surgery   Pulmonary hypertension, secondary    RHINITIS 07/21/2010        RUQ abdominal pain    S/P left TKA 07/27/2011   Sinus node dysfunction (HCC) 11/08/2015   Sleep apnea    Spinal stenosis of cervical region 11/07/2014   Urinary frequency 12/09/2018   UTI (urinary tract infection) 08/15/2019   per pt report   VENTRICULAR HYPERTROPHY, LEFT 11/24/2008   Qualifier: Diagnosis of  By: Vikki Ports      Objective:  Physical Exam: Vascular: DP/PT pulses 2/4 bilateral. CFT <3 seconds. Absent hair growth on digits. Edema noted to bilateral lower extremities. Xerosis noted bilaterally.  Skin. No lacerations or abrasions bilateral feet. Nails 1-5 right and 23,5 on left are thickened discolored and elongated with subungual  debris.  Musculoskeletal: MMT 5/5 bilateral lower extremities in DF, PF, Inversion and Eversion. Deceased ROM in DF of ankle joint. History of amputation left hallux and left fourth digit. Hammered digits remaining lesser digits bilateral.  Neurological: Sensation intact to light touch. Protective sensation diminished bilateral.    Assessment:   1. Pain due to onychomycosis of toenail   2. Neuropathy   3. Diabetic peripheral neuropathy associated with type 2 diabetes mellitus (HCC)   4. History of amputation of lesser toe of left foot (HCC)   5. Hammertoe, bilateral      Plan:  Patient was evaluated and treated and all questions answered. -X-rays no acute fractures or osseous erosions. Mild degenerative changes noted in midfoot area.  -Discussed and educated patient on diabetic foot care, especially with  regards to the vascular, neurological and musculoskeletal systems.  -Stressed the importance of good glycemic control and the detriment of not  controlling glucose levels in relation to the foot. -Discussed supportive shoes at all times and checking feet regularly.  -Mechanically debrided all nails 1-5 bilateral using sterile nail nipper and filed with dremel without incident  -DM shoes ordered  -Patient had been advised by PCP can go up on her gabapentin dose and agree this may be helpful.  -Answered all patient questions -Patient to return  in 3 months for at risk foot care -Patient advised to call the office if any problems or questions arise in the meantime.   Louann Sjogren, DPM

## 2023-08-13 DIAGNOSIS — N186 End stage renal disease: Secondary | ICD-10-CM | POA: Diagnosis not present

## 2023-08-13 DIAGNOSIS — Z992 Dependence on renal dialysis: Secondary | ICD-10-CM | POA: Diagnosis not present

## 2023-08-13 DIAGNOSIS — N2581 Secondary hyperparathyroidism of renal origin: Secondary | ICD-10-CM | POA: Diagnosis not present

## 2023-08-13 DIAGNOSIS — D631 Anemia in chronic kidney disease: Secondary | ICD-10-CM | POA: Diagnosis not present

## 2023-08-13 DIAGNOSIS — E1122 Type 2 diabetes mellitus with diabetic chronic kidney disease: Secondary | ICD-10-CM | POA: Diagnosis not present

## 2023-08-16 DIAGNOSIS — E1122 Type 2 diabetes mellitus with diabetic chronic kidney disease: Secondary | ICD-10-CM | POA: Diagnosis not present

## 2023-08-16 DIAGNOSIS — Z992 Dependence on renal dialysis: Secondary | ICD-10-CM | POA: Diagnosis not present

## 2023-08-16 DIAGNOSIS — N2581 Secondary hyperparathyroidism of renal origin: Secondary | ICD-10-CM | POA: Diagnosis not present

## 2023-08-16 DIAGNOSIS — D631 Anemia in chronic kidney disease: Secondary | ICD-10-CM | POA: Diagnosis not present

## 2023-08-16 DIAGNOSIS — N186 End stage renal disease: Secondary | ICD-10-CM | POA: Diagnosis not present

## 2023-08-20 DIAGNOSIS — N186 End stage renal disease: Secondary | ICD-10-CM | POA: Diagnosis not present

## 2023-08-20 DIAGNOSIS — Z992 Dependence on renal dialysis: Secondary | ICD-10-CM | POA: Diagnosis not present

## 2023-08-20 DIAGNOSIS — N2581 Secondary hyperparathyroidism of renal origin: Secondary | ICD-10-CM | POA: Diagnosis not present

## 2023-08-20 DIAGNOSIS — D631 Anemia in chronic kidney disease: Secondary | ICD-10-CM | POA: Diagnosis not present

## 2023-08-20 DIAGNOSIS — E1122 Type 2 diabetes mellitus with diabetic chronic kidney disease: Secondary | ICD-10-CM | POA: Diagnosis not present

## 2023-08-23 DIAGNOSIS — E1122 Type 2 diabetes mellitus with diabetic chronic kidney disease: Secondary | ICD-10-CM | POA: Diagnosis not present

## 2023-08-23 DIAGNOSIS — N186 End stage renal disease: Secondary | ICD-10-CM | POA: Diagnosis not present

## 2023-08-23 DIAGNOSIS — Z992 Dependence on renal dialysis: Secondary | ICD-10-CM | POA: Diagnosis not present

## 2023-08-23 DIAGNOSIS — N2581 Secondary hyperparathyroidism of renal origin: Secondary | ICD-10-CM | POA: Diagnosis not present

## 2023-08-23 DIAGNOSIS — D631 Anemia in chronic kidney disease: Secondary | ICD-10-CM | POA: Diagnosis not present

## 2023-08-27 DIAGNOSIS — N186 End stage renal disease: Secondary | ICD-10-CM | POA: Diagnosis not present

## 2023-08-27 DIAGNOSIS — N2581 Secondary hyperparathyroidism of renal origin: Secondary | ICD-10-CM | POA: Diagnosis not present

## 2023-08-27 DIAGNOSIS — Z992 Dependence on renal dialysis: Secondary | ICD-10-CM | POA: Diagnosis not present

## 2023-08-27 DIAGNOSIS — E1122 Type 2 diabetes mellitus with diabetic chronic kidney disease: Secondary | ICD-10-CM | POA: Diagnosis not present

## 2023-08-27 DIAGNOSIS — D631 Anemia in chronic kidney disease: Secondary | ICD-10-CM | POA: Diagnosis not present

## 2023-08-29 ENCOUNTER — Encounter (HOSPITAL_COMMUNITY): Payer: Self-pay

## 2023-08-29 ENCOUNTER — Inpatient Hospital Stay (HOSPITAL_COMMUNITY)
Admission: EM | Admit: 2023-08-29 | Discharge: 2023-08-30 | DRG: 190 | Disposition: A | Discharge status: Home / Self Care | Attending: Internal Medicine | Admitting: Internal Medicine

## 2023-08-29 ENCOUNTER — Emergency Department (HOSPITAL_COMMUNITY)

## 2023-08-29 ENCOUNTER — Other Ambulatory Visit: Payer: Self-pay

## 2023-08-29 DIAGNOSIS — E1122 Type 2 diabetes mellitus with diabetic chronic kidney disease: Secondary | ICD-10-CM | POA: Diagnosis not present

## 2023-08-29 DIAGNOSIS — Z7901 Long term (current) use of anticoagulants: Secondary | ICD-10-CM

## 2023-08-29 DIAGNOSIS — Z981 Arthrodesis status: Secondary | ICD-10-CM

## 2023-08-29 DIAGNOSIS — R918 Other nonspecific abnormal finding of lung field: Secondary | ICD-10-CM | POA: Diagnosis not present

## 2023-08-29 DIAGNOSIS — E890 Postprocedural hypothyroidism: Secondary | ICD-10-CM | POA: Diagnosis not present

## 2023-08-29 DIAGNOSIS — E785 Hyperlipidemia, unspecified: Secondary | ICD-10-CM | POA: Diagnosis not present

## 2023-08-29 DIAGNOSIS — Z66 Do not resuscitate: Secondary | ICD-10-CM | POA: Diagnosis not present

## 2023-08-29 DIAGNOSIS — Z87891 Personal history of nicotine dependence: Secondary | ICD-10-CM

## 2023-08-29 DIAGNOSIS — I132 Hypertensive heart and chronic kidney disease with heart failure and with stage 5 chronic kidney disease, or end stage renal disease: Secondary | ICD-10-CM | POA: Diagnosis not present

## 2023-08-29 DIAGNOSIS — I272 Pulmonary hypertension, unspecified: Secondary | ICD-10-CM | POA: Diagnosis not present

## 2023-08-29 DIAGNOSIS — E038 Other specified hypothyroidism: Secondary | ICD-10-CM | POA: Diagnosis not present

## 2023-08-29 DIAGNOSIS — J441 Chronic obstructive pulmonary disease with (acute) exacerbation: Principal | ICD-10-CM

## 2023-08-29 DIAGNOSIS — Z888 Allergy status to other drugs, medicaments and biological substances status: Secondary | ICD-10-CM

## 2023-08-29 DIAGNOSIS — J9600 Acute respiratory failure, unspecified whether with hypoxia or hypercapnia: Secondary | ICD-10-CM | POA: Diagnosis not present

## 2023-08-29 DIAGNOSIS — I509 Heart failure, unspecified: Secondary | ICD-10-CM | POA: Diagnosis not present

## 2023-08-29 DIAGNOSIS — D72829 Elevated white blood cell count, unspecified: Secondary | ICD-10-CM | POA: Diagnosis not present

## 2023-08-29 DIAGNOSIS — Z992 Dependence on renal dialysis: Secondary | ICD-10-CM

## 2023-08-29 DIAGNOSIS — Z794 Long term (current) use of insulin: Secondary | ICD-10-CM | POA: Diagnosis not present

## 2023-08-29 DIAGNOSIS — I2510 Atherosclerotic heart disease of native coronary artery without angina pectoris: Secondary | ICD-10-CM | POA: Diagnosis present

## 2023-08-29 DIAGNOSIS — Z885 Allergy status to narcotic agent status: Secondary | ICD-10-CM

## 2023-08-29 DIAGNOSIS — G4733 Obstructive sleep apnea (adult) (pediatric): Secondary | ICD-10-CM | POA: Diagnosis present

## 2023-08-29 DIAGNOSIS — N186 End stage renal disease: Secondary | ICD-10-CM | POA: Diagnosis not present

## 2023-08-29 DIAGNOSIS — F411 Generalized anxiety disorder: Secondary | ICD-10-CM | POA: Insufficient documentation

## 2023-08-29 DIAGNOSIS — D631 Anemia in chronic kidney disease: Secondary | ICD-10-CM | POA: Diagnosis present

## 2023-08-29 DIAGNOSIS — I480 Paroxysmal atrial fibrillation: Secondary | ICD-10-CM | POA: Diagnosis present

## 2023-08-29 DIAGNOSIS — Z801 Family history of malignant neoplasm of trachea, bronchus and lung: Secondary | ICD-10-CM

## 2023-08-29 DIAGNOSIS — M109 Gout, unspecified: Secondary | ICD-10-CM | POA: Diagnosis not present

## 2023-08-29 DIAGNOSIS — D638 Anemia in other chronic diseases classified elsewhere: Secondary | ICD-10-CM | POA: Diagnosis not present

## 2023-08-29 DIAGNOSIS — E1140 Type 2 diabetes mellitus with diabetic neuropathy, unspecified: Secondary | ICD-10-CM | POA: Diagnosis not present

## 2023-08-29 DIAGNOSIS — K219 Gastro-esophageal reflux disease without esophagitis: Secondary | ICD-10-CM | POA: Diagnosis not present

## 2023-08-29 DIAGNOSIS — E119 Type 2 diabetes mellitus without complications: Secondary | ICD-10-CM

## 2023-08-29 DIAGNOSIS — R0603 Acute respiratory distress: Secondary | ICD-10-CM

## 2023-08-29 DIAGNOSIS — I499 Cardiac arrhythmia, unspecified: Secondary | ICD-10-CM | POA: Diagnosis not present

## 2023-08-29 DIAGNOSIS — J9611 Chronic respiratory failure with hypoxia: Secondary | ICD-10-CM | POA: Diagnosis present

## 2023-08-29 DIAGNOSIS — Z800 Family history of malignant neoplasm of digestive organs: Secondary | ICD-10-CM

## 2023-08-29 DIAGNOSIS — J9811 Atelectasis: Secondary | ICD-10-CM | POA: Diagnosis not present

## 2023-08-29 DIAGNOSIS — J90 Pleural effusion, not elsewhere classified: Secondary | ICD-10-CM | POA: Diagnosis not present

## 2023-08-29 DIAGNOSIS — Z89422 Acquired absence of other left toe(s): Secondary | ICD-10-CM

## 2023-08-29 DIAGNOSIS — R7989 Other specified abnormal findings of blood chemistry: Secondary | ICD-10-CM | POA: Diagnosis present

## 2023-08-29 DIAGNOSIS — Z950 Presence of cardiac pacemaker: Secondary | ICD-10-CM | POA: Diagnosis present

## 2023-08-29 DIAGNOSIS — Z79899 Other long term (current) drug therapy: Secondary | ICD-10-CM

## 2023-08-29 DIAGNOSIS — Z833 Family history of diabetes mellitus: Secondary | ICD-10-CM

## 2023-08-29 DIAGNOSIS — I10 Essential (primary) hypertension: Secondary | ICD-10-CM | POA: Diagnosis present

## 2023-08-29 DIAGNOSIS — R092 Respiratory arrest: Secondary | ICD-10-CM | POA: Diagnosis not present

## 2023-08-29 DIAGNOSIS — Z79890 Hormone replacement therapy (postmenopausal): Secondary | ICD-10-CM

## 2023-08-29 DIAGNOSIS — Z96652 Presence of left artificial knee joint: Secondary | ICD-10-CM | POA: Diagnosis present

## 2023-08-29 DIAGNOSIS — R0602 Shortness of breath: Secondary | ICD-10-CM | POA: Diagnosis not present

## 2023-08-29 DIAGNOSIS — G894 Chronic pain syndrome: Secondary | ICD-10-CM | POA: Insufficient documentation

## 2023-08-29 DIAGNOSIS — Z8249 Family history of ischemic heart disease and other diseases of the circulatory system: Secondary | ICD-10-CM

## 2023-08-29 DIAGNOSIS — Z9981 Dependence on supplemental oxygen: Secondary | ICD-10-CM

## 2023-08-29 DIAGNOSIS — Z89412 Acquired absence of left great toe: Secondary | ICD-10-CM

## 2023-08-29 DIAGNOSIS — E039 Hypothyroidism, unspecified: Secondary | ICD-10-CM | POA: Insufficient documentation

## 2023-08-29 DIAGNOSIS — Z7951 Long term (current) use of inhaled steroids: Secondary | ICD-10-CM

## 2023-08-29 DIAGNOSIS — I4891 Unspecified atrial fibrillation: Secondary | ICD-10-CM | POA: Diagnosis not present

## 2023-08-29 DIAGNOSIS — R069 Unspecified abnormalities of breathing: Secondary | ICD-10-CM | POA: Diagnosis not present

## 2023-08-29 LAB — COMPREHENSIVE METABOLIC PANEL WITH GFR
ALT: 13 U/L (ref 0–44)
AST: 25 U/L (ref 15–41)
Albumin: 3 g/dL — ABNORMAL LOW (ref 3.5–5.0)
Alkaline Phosphatase: 78 U/L (ref 38–126)
Anion gap: 14 (ref 5–15)
BUN: 44 mg/dL — ABNORMAL HIGH (ref 8–23)
CO2: 25 mmol/L (ref 22–32)
Calcium: 8.8 mg/dL — ABNORMAL LOW (ref 8.9–10.3)
Chloride: 98 mmol/L (ref 98–111)
Creatinine, Ser: 2.31 mg/dL — ABNORMAL HIGH (ref 0.44–1.00)
GFR, Estimated: 21 mL/min — ABNORMAL LOW (ref >60)
Glucose, Bld: 236 mg/dL — ABNORMAL HIGH (ref 70–99)
Potassium: 3.7 mmol/L (ref 3.5–5.1)
Sodium: 137 mmol/L (ref 135–145)
Total Bilirubin: 0.6 mg/dL (ref 0.0–1.2)
Total Protein: 7.1 g/dL (ref 6.5–8.1)

## 2023-08-29 LAB — I-STAT VENOUS BLOOD GAS, ED
Acid-Base Excess: 5 mmol/L — ABNORMAL HIGH (ref 0.0–2.0)
Bicarbonate: 30.4 mmol/L — ABNORMAL HIGH (ref 20.0–28.0)
Calcium, Ion: 1.04 mmol/L — ABNORMAL LOW (ref 1.15–1.40)
HCT: 33 % — ABNORMAL LOW (ref 36.0–46.0)
Hemoglobin: 11.2 g/dL — ABNORMAL LOW (ref 12.0–15.0)
O2 Saturation: 82 %
Potassium: 3.7 mmol/L (ref 3.5–5.1)
Sodium: 138 mmol/L (ref 135–145)
TCO2: 32 mmol/L (ref 22–32)
pCO2, Ven: 49.2 mmHg (ref 44–60)
pH, Ven: 7.399 (ref 7.25–7.43)
pO2, Ven: 48 mmHg — ABNORMAL HIGH (ref 32–45)

## 2023-08-29 LAB — I-STAT ARTERIAL BLOOD GAS, ED
Acid-Base Excess: 3 mmol/L — ABNORMAL HIGH (ref 0.0–2.0)
Bicarbonate: 29.3 mmol/L — ABNORMAL HIGH (ref 20.0–28.0)
Calcium, Ion: 1.14 mmol/L — ABNORMAL LOW (ref 1.15–1.40)
HCT: 28 % — ABNORMAL LOW (ref 36.0–46.0)
Hemoglobin: 9.5 g/dL — ABNORMAL LOW (ref 12.0–15.0)
O2 Saturation: 99 %
Patient temperature: 96.6
Potassium: 3.4 mmol/L — ABNORMAL LOW (ref 3.5–5.1)
Sodium: 138 mmol/L (ref 135–145)
TCO2: 31 mmol/L (ref 22–32)
pCO2 arterial: 49.2 mmHg — ABNORMAL HIGH (ref 32–48)
pH, Arterial: 7.378 (ref 7.35–7.45)
pO2, Arterial: 162 mmHg — ABNORMAL HIGH (ref 83–108)

## 2023-08-29 LAB — CBC WITH DIFFERENTIAL/PLATELET
Abs Immature Granulocytes: 0.09 10*3/uL — ABNORMAL HIGH (ref 0.00–0.07)
Basophils Absolute: 0.1 10*3/uL (ref 0.0–0.1)
Basophils Relative: 1 %
Eosinophils Absolute: 0.2 10*3/uL (ref 0.0–0.5)
Eosinophils Relative: 1 %
HCT: 34.2 % — ABNORMAL LOW (ref 36.0–46.0)
Hemoglobin: 10.5 g/dL — ABNORMAL LOW (ref 12.0–15.0)
Immature Granulocytes: 1 %
Lymphocytes Relative: 17 %
Lymphs Abs: 2.8 10*3/uL (ref 0.7–4.0)
MCH: 31.3 pg (ref 26.0–34.0)
MCHC: 30.7 g/dL (ref 30.0–36.0)
MCV: 102.1 fL — ABNORMAL HIGH (ref 80.0–100.0)
Monocytes Absolute: 1.2 10*3/uL — ABNORMAL HIGH (ref 0.1–1.0)
Monocytes Relative: 7 %
Neutro Abs: 12.2 10*3/uL — ABNORMAL HIGH (ref 1.7–7.7)
Neutrophils Relative %: 73 %
Platelets: 430 10*3/uL — ABNORMAL HIGH (ref 150–400)
RBC: 3.35 MIL/uL — ABNORMAL LOW (ref 3.87–5.11)
RDW: 15.1 % (ref 11.5–15.5)
WBC: 16.5 10*3/uL — ABNORMAL HIGH (ref 4.0–10.5)
nRBC: 0 % (ref 0.0–0.2)

## 2023-08-29 LAB — CBG MONITORING, ED: Glucose-Capillary: 236 mg/dL — ABNORMAL HIGH (ref 70–99)

## 2023-08-29 LAB — HEMOGLOBIN A1C
Hgb A1c MFr Bld: 6.7 % — ABNORMAL HIGH (ref 4.8–5.6)
Mean Plasma Glucose: 145.59 mg/dL

## 2023-08-29 LAB — BRAIN NATRIURETIC PEPTIDE: B Natriuretic Peptide: 468.9 pg/mL — ABNORMAL HIGH (ref 0.0–100.0)

## 2023-08-29 LAB — TROPONIN I (HIGH SENSITIVITY)
Troponin I (High Sensitivity): 36 ng/L — ABNORMAL HIGH (ref <18)
Troponin I (High Sensitivity): 53 ng/L — ABNORMAL HIGH (ref <18)

## 2023-08-29 MED ORDER — ISOSORBIDE MONONITRATE ER 30 MG PO TB24
60.0000 mg | ORAL_TABLET | Freq: Every day | ORAL | Status: DC
  Administered 2023-08-30: 60 mg via ORAL
  Filled 2023-08-29: qty 2

## 2023-08-29 MED ORDER — GUAIFENESIN ER 600 MG PO TB12: 600.0000 mg | ORAL_TABLET | Freq: Two times a day (BID) | ORAL | Status: DC

## 2023-08-29 MED ORDER — IPRATROPIUM-ALBUTEROL 0.5-2.5 (3) MG/3ML IN SOLN: 3.0000 mL | RESPIRATORY_TRACT | Status: DC | PRN

## 2023-08-29 MED ORDER — APIXABAN 2.5 MG PO TABS
2.5000 mg | ORAL_TABLET | Freq: Two times a day (BID) | ORAL | Status: DC
  Administered 2023-08-29 – 2023-08-30 (×2): 2.5 mg via ORAL
  Filled 2023-08-29 (×2): qty 1

## 2023-08-29 MED ORDER — ONDANSETRON HCL 4 MG/2ML IJ SOLN: 4.0000 mg | Freq: Four times a day (QID) | INTRAMUSCULAR | Status: DC | PRN

## 2023-08-29 MED ORDER — LEVOTHYROXINE SODIUM 100 MCG PO TABS
100.0000 ug | ORAL_TABLET | ORAL | Status: DC
  Administered 2023-08-30: 100 ug via ORAL
  Filled 2023-08-29: qty 1

## 2023-08-29 MED ORDER — TORSEMIDE 20 MG PO TABS
100.0000 mg | ORAL_TABLET | Freq: Once | ORAL | Status: AC
Start: 2023-08-29 — End: 2023-08-29
  Administered 2023-08-29: 100 mg via ORAL
  Filled 2023-08-29: qty 5

## 2023-08-29 MED ORDER — SODIUM CHLORIDE 0.9 % IV SOLN: 250.0000 mL | INTRAVENOUS | Status: DC | PRN

## 2023-08-29 MED ORDER — SODIUM CHLORIDE 0.9 % IV SOLN: 500.0000 mg | INTRAVENOUS | Status: DC

## 2023-08-29 MED ORDER — GUAIFENESIN 100 MG/5ML PO LIQD
5.0000 mL | Freq: Four times a day (QID) | ORAL | Status: DC
Start: 2023-08-29 — End: 2023-08-30
  Administered 2023-08-29 – 2023-08-30 (×3): 5 mL via ORAL
  Filled 2023-08-29 (×3): qty 10

## 2023-08-29 MED ORDER — BUDESONIDE 0.25 MG/2ML IN SUSP
0.2500 mg | Freq: Two times a day (BID) | RESPIRATORY_TRACT | Status: DC
  Administered 2023-08-29 – 2023-08-30 (×2): 0.25 mg via RESPIRATORY_TRACT
  Filled 2023-08-29 (×2): qty 2

## 2023-08-29 MED ORDER — METHYLPREDNISOLONE SODIUM SUCC 125 MG IJ SOLR
60.0000 mg | Freq: Every day | INTRAMUSCULAR | Status: DC
  Administered 2023-08-30: 60 mg via INTRAVENOUS
  Filled 2023-08-29: qty 2

## 2023-08-29 MED ORDER — ONDANSETRON HCL 4 MG PO TABS
4.0000 mg | ORAL_TABLET | Freq: Four times a day (QID) | ORAL | Status: DC | PRN
Start: 2023-08-29 — End: 2023-08-30

## 2023-08-29 MED ORDER — BENZONATATE 100 MG PO CAPS
100.0000 mg | ORAL_CAPSULE | Freq: Three times a day (TID) | ORAL | Status: DC
  Administered 2023-08-29 – 2023-08-30 (×2): 100 mg via ORAL
  Filled 2023-08-29 (×2): qty 1

## 2023-08-29 MED ORDER — AMLODIPINE BESYLATE 5 MG PO TABS
5.0000 mg | ORAL_TABLET | Freq: Every day | ORAL | Status: DC
  Administered 2023-08-30: 5 mg via ORAL
  Filled 2023-08-29: qty 1

## 2023-08-29 MED ORDER — ACETAMINOPHEN 325 MG PO TABS: 650.0000 mg | ORAL_TABLET | Freq: Four times a day (QID) | ORAL | Status: DC | PRN

## 2023-08-29 MED ORDER — INSULIN ASPART 100 UNIT/ML IJ SOLN
0.0000 [IU] | Freq: Three times a day (TID) | INTRAMUSCULAR | Status: DC
  Administered 2023-08-30: 5 [IU] via SUBCUTANEOUS
  Administered 2023-08-30: 3 [IU] via SUBCUTANEOUS

## 2023-08-29 MED ORDER — LEVOTHYROXINE SODIUM 100 MCG PO TABS: 100.0000 ug | ORAL_TABLET | ORAL | Status: DC

## 2023-08-29 MED ORDER — SEVELAMER CARBONATE 800 MG PO TABS
800.0000 mg | ORAL_TABLET | Freq: Three times a day (TID) | ORAL | Status: DC
  Administered 2023-08-30 (×2): 800 mg via ORAL
  Filled 2023-08-29 (×2): qty 1

## 2023-08-29 MED ORDER — SODIUM CHLORIDE 0.9% FLUSH: 3.0000 mL | INTRAVENOUS | Status: DC | PRN

## 2023-08-29 MED ORDER — LEVOTHYROXINE SODIUM 100 MCG PO TABS: 200.0000 ug | ORAL_TABLET | ORAL | Status: DC

## 2023-08-29 MED ORDER — IPRATROPIUM-ALBUTEROL 0.5-2.5 (3) MG/3ML IN SOLN
3.0000 mL | Freq: Four times a day (QID) | RESPIRATORY_TRACT | Status: DC
  Administered 2023-08-29 – 2023-08-30 (×3): 3 mL via RESPIRATORY_TRACT
  Filled 2023-08-29 (×3): qty 3

## 2023-08-29 MED ORDER — GABAPENTIN 400 MG PO CAPS
800.0000 mg | ORAL_CAPSULE | Freq: Three times a day (TID) | ORAL | Status: DC
  Administered 2023-08-29 – 2023-08-30 (×2): 800 mg via ORAL
  Filled 2023-08-29: qty 2
  Filled 2023-08-29: qty 8

## 2023-08-29 MED ORDER — VITAMIN B-12 1000 MCG PO TABS
1000.0000 ug | ORAL_TABLET | Freq: Every day | ORAL | Status: DC
  Administered 2023-08-30: 1000 ug via ORAL
  Filled 2023-08-29: qty 1

## 2023-08-29 MED ORDER — SODIUM CHLORIDE 0.9 % IV SOLN
500.0000 mg | Freq: Once | INTRAVENOUS | Status: AC
  Administered 2023-08-29: 500 mg via INTRAVENOUS
  Filled 2023-08-29: qty 5

## 2023-08-29 MED ORDER — LEVOTHYROXINE SODIUM 100 MCG PO TABS
100.0000 ug | ORAL_TABLET | ORAL | Status: DC
Start: 2023-08-29 — End: 2023-08-29

## 2023-08-29 MED ORDER — HYDROCODONE-ACETAMINOPHEN 7.5-325 MG PO TABS: 1.0000 | ORAL_TABLET | Freq: Four times a day (QID) | ORAL | Status: DC

## 2023-08-29 MED ORDER — TORSEMIDE 20 MG PO TABS
20.0000 mg | ORAL_TABLET | Freq: Two times a day (BID) | ORAL | Status: DC
Start: 2023-08-31 — End: 2023-08-30
  Filled 2023-08-29: qty 1

## 2023-08-29 MED ORDER — SODIUM CHLORIDE 0.9% FLUSH
3.0000 mL | Freq: Two times a day (BID) | INTRAVENOUS | Status: DC
  Administered 2023-08-29 – 2023-08-30 (×2): 3 mL via INTRAVENOUS

## 2023-08-29 MED ORDER — INSULIN GLARGINE-YFGN 100 UNIT/ML ~~LOC~~ SOLN
50.0000 [IU] | Freq: Every day | SUBCUTANEOUS | Status: DC
Start: 2023-08-30 — End: 2023-08-30
  Administered 2023-08-30: 50 [IU] via SUBCUTANEOUS
  Filled 2023-08-29 (×2): qty 0.5

## 2023-08-29 MED ORDER — ACETAMINOPHEN 650 MG RE SUPP: 650.0000 mg | Freq: Four times a day (QID) | RECTAL | Status: DC | PRN

## 2023-08-29 MED ORDER — INSULIN ASPART 100 UNIT/ML IJ SOLN
0.0000 [IU] | Freq: Every day | INTRAMUSCULAR | Status: DC
  Administered 2023-08-29: 2 [IU] via SUBCUTANEOUS

## 2023-08-29 MED ORDER — HYDROCODONE-ACETAMINOPHEN 7.5-325 MG PO TABS: 1.0000 | ORAL_TABLET | Freq: Four times a day (QID) | ORAL | Status: DC | PRN

## 2023-08-29 MED ORDER — ATORVASTATIN CALCIUM 40 MG PO TABS
40.0000 mg | ORAL_TABLET | Freq: Every day | ORAL | Status: DC
  Administered 2023-08-30: 40 mg via ORAL
  Filled 2023-08-29: qty 1

## 2023-08-29 MED ORDER — LEVALBUTEROL HCL 0.63 MG/3ML IN NEBU: 0.6300 mg | INHALATION_SOLUTION | Freq: Four times a day (QID) | RESPIRATORY_TRACT | Status: DC | PRN

## 2023-08-29 MED ORDER — TORSEMIDE 20 MG PO TABS: 100.0000 mg | ORAL_TABLET | ORAL | Status: DC

## 2023-08-29 MED ORDER — FERROUS SULFATE 325 (65 FE) MG PO TABS
325.0000 mg | ORAL_TABLET | Freq: Every day | ORAL | Status: DC
Start: 2023-08-30 — End: 2023-08-30
  Administered 2023-08-30: 325 mg via ORAL
  Filled 2023-08-29: qty 1

## 2023-08-29 MED ORDER — ESCITALOPRAM OXALATE 10 MG PO TABS
5.0000 mg | ORAL_TABLET | Freq: Every day | ORAL | Status: DC
  Administered 2023-08-30: 5 mg via ORAL
  Filled 2023-08-29: qty 1

## 2023-08-29 NOTE — Progress Notes (Addendum)
 Pt. Was taken off bipap per. PA request and placed on [place] 3L. VS are stable at this time

## 2023-08-29 NOTE — ED Triage Notes (Signed)
 Patient BIB [facility] from home due to respiratory distress. Patient has hx of COPD wearing 3LNC at baseline. Patient arrived on Bi-Pap. 20 albuterol , 2g Mag, 1.5 atrovent , 125 solumedrol, and 0.3mg  IM epi given en route. Dialysis patient M,W,F and has not missed dose.

## 2023-08-29 NOTE — H&P (Signed)
 History and Physical    Kayla [name] GMW:[id number] DOB: 1940-12-10 DOA: 08/29/2023  PCP: Annette Barters, MD   Patient coming from: Home   Chief Complaint:  Chief Complaint  Patient presents with   Respiratory Distress   ED TRIAGE note:  Patient BIB [facility] from home due to respiratory distress. Patient has hx of COPD wearing 3LNC at baseline. Patient arrived on Bi-Pap. 20 albuterol , 2g Mag, 1.5 atrovent , 125 solumedrol, and 0.3mg  IM epi given en route. Dialysis patient M,W,F and has not missed dos      HPI:  Kayla [name] is a 83 y.o. female with medical history significant of ESRD on HD MWF schedule, COPD, chronic hypoxic respiratory failure 3 L oxygen  at baseline, paroxysmal atrial fibrillation on Eliquis , anemia of chronic disease, essential hypertension, DM type II, hyperlipidemia, hypohyroidism, and history of MRSA endocarditis presented to emergency department presented to emergency department accompanied via EMS for respiratory distress.  Initially patient has been placed on BiPAP treated with albuterol , Atrovent , magnesium , Solu-Medrol  and IM epinephrine .  Last dialysis on Friday. Patient reported that she has been having a lot of mucoid cough lately.  Denies any fever, chill, chest pain, abdominal pain nausea and vomiting and diarrhea.  No known sick contact.    ED Course:  Initially patient was on BiPAP which has been transition to 3 L nasal cannula oxygen .  O2 sat currently 100% and patient is tolerating well [place] oxygen ..  Otherwise hemodynamically stable. CBC showing leukocytosis 16.5.  Stable H&H and normal platelet count. CMP unremarkable except evidence of chronic ESRD.  No electrolyte derangement. Elevated BNP 468.  Elevated troponin 36. VBG showed pH 7.3, pCO2 49, pO2 48 and bicarb 30.  And ABG showing pH 7.3, pCO2 49, PO2 162 and bicarb 29.  No evidence of respiratory acidosis and hypercarbia. EKG showing atrial fibrillation rate controlled to 81.  Chest  x-ray showing patchy bilateral lower lobe opacities favoring atelectasis/scarring.  Small right pleural effusion.  In the ED patient received azithromycin  500 mg.  Hospitalist has been consulted for further evaluation management of respiratory distress in the setting of COPD exacerbation.   Significant labs in the ED: Lab Orders         Respiratory (~20 pathogens) panel by PCR         CBC with Differential         Comprehensive metabolic panel         Brain natriuretic peptide         Blood gas, arterial         Comprehensive metabolic panel         CBC         Hemoglobin A1c         I-Stat venous blood gas, ([facility], [facility], [facility])         I-Stat arterial blood gas, ED       Review of Systems:  Review of Systems  Constitutional:  Negative for chills, fever and malaise/fatigue.  Respiratory:  Positive for cough, sputum production and shortness of breath. Negative for wheezing.   Cardiovascular:  Negative for chest pain and palpitations.  Gastrointestinal:  Negative for heartburn, nausea and vomiting.  Neurological:  Negative for dizziness and headaches.  Psychiatric/Behavioral:  The patient is nervous/anxious.     Past Medical History:  Diagnosis Date   Allergy    Anxiety    Arthritis    CAD (coronary artery disease)    Mild per remote cath in 2006, /  Nuclear, January, 2013, no significant abnormality,   Carotid artery disease (HCC)    Doppler, January, 2013, 0 39% RIC A., 40-59% LICA, stable   Cataract    CHF (congestive heart failure) (HCC)    Chronic kidney disease    Complication of anesthesia    wakes up "mean"   COPD 02/16/2007   Intolerant of Spiriva , tudorza Intolerant of Trelegy    COPD (chronic obstructive pulmonary disease) (HCC)    Coronary atherosclerosis 11/24/2008   Catheterization 2006, nonobstructive coronary disease   //   nuclear 2013, low risk     Diabetes mellitus, type 2 (HCC)    Diverticula of colon    DIVERTICULOSIS OF COLON 03/23/2007    Qualifier: Diagnosis of  By: Matilde Son MD, Vineet     Dizziness    occasional   Dizzy spells 05/04/2011   DM (diabetes mellitus) (HCC) 03/23/2007   Qualifier: Diagnosis of  By: Matilde Son MD, Vineet     Dyspnea    On exertion   Ejection fraction    Essential hypertension 02/16/2007   Qualifier: Diagnosis of  By: Nolon Baxter CMA, Tammy     G E R D 03/23/2007   Qualifier: Diagnosis of  By: Matilde Son MD, Vineet     Gall bladder disease 04/28/2016   Gastritis and gastroduodenitis    GERD (gastroesophageal reflux disease)    Goiter    hx of   Headache(784.0)    migraine   Hyperlipidemia    Hypertension    Hypothyroidism    HYPOTHYROIDISM, POSTSURGICAL 06/04/2008   Qualifier: Diagnosis of  By: Washington Hacker MD, Sean A    Left ventricular hypertrophy    Leg edema    occasional swelling   Leg swelling 12/09/2018   Myofascial pain syndrome, cervical 01/06/2018   Obesity    OBESITY 11/24/2008   Qualifier: Diagnosis of  By: Jaramillo, Luz     OBSTRUCTIVE SLEEP APNEA 02/16/2007   Auto CPAP 09/03/13 to 10/02/13 >> used on 30 of 30 nights with average 6 hrs and 3 min.  Average AHI is 3.1 with median CPAP 5 cm H2O and 95 th percentile CPAP 6 cm H2O.     Occipital neuralgia of right side 01/06/2018   OSA (obstructive sleep apnea)    cpap setting of 12   Pain in joint of right hip 01/21/2018   Palpitations 01/21/2011   48 hour Holter, 2015, scattered PACs and PVCs.    Paroxysmal atrial fibrillation (HCC)    Pneumonia    PONV (postoperative nausea and vomiting)    severe after every surgery   Pulmonary hypertension, secondary    RHINITIS 07/21/2010        RUQ abdominal pain    S/P left TKA 07/27/2011   Sinus node dysfunction (HCC) 11/08/2015   Sleep apnea    Spinal stenosis of cervical region 11/07/2014   Urinary frequency 12/09/2018   UTI (urinary tract infection) 08/15/2019   per pt report   VENTRICULAR HYPERTROPHY, LEFT 11/24/2008   Qualifier: Diagnosis of  By: Malon Seamen      Past Surgical  History:  Procedure Laterality Date   ABDOMINAL HYSTERECTOMY     with appendectomy and colon polypectomy   AMPUTATION TOE Left 12/15/2019   Procedure: AMPUTATION  LEFT GREAT TOE;  Surgeon: Camilo Cella, DPM;  Location: [facility];  Service: Podiatry;  Laterality: Left;   AMPUTATION TOE Left 01/22/2022   Procedure: AMPUTATION TOE 4th digit;  Surgeon: Velma Ghazi, DPM;  Location: [facility];  Service:  Podiatry;  Laterality: Left;   AMPUTATION TOE Left 09/04/2022   Procedure: LEFT BIG TOE AMPUTAION;  Surgeon: Charity Conch, DPM;  Location: [facility];  Service: Podiatry;  Laterality: Left;   ANTERIOR CERVICAL DECOMP/DISCECTOMY FUSION N/A 11/07/2014   Procedure: Anterior Cervical diskectomy and fusion Cervical four-five, Cervical five-six, Cervical six-seven;  Surgeon: Gearl Keens, MD;  Location: [facility];  Service: Neurosurgery;  Laterality: N/A;   APPENDECTOMY     ARTERY BIOPSY Right 02/16/2017   Procedure: BIOPSY TEMPORAL ARTERY;  Surgeon: Jackquelyn Mass, MD;  Location: [facility];  Service: Vascular;  Laterality: Right;   ARTERY BIOPSY Left 04/16/2020   Procedure: BIOPSY TEMPORAL ARTERY;  Surgeon: Jackquelyn Mass, MD;  Location: [facility];  Service: Vascular;  Laterality: Left;  LEFT TEMPLE   AV FISTULA PLACEMENT Left 04/13/2023   Procedure: LEFT ARM ARTERIOVENOUS (AV) FISTULA CREATION;  Surgeon: Kayla Part, MD;  [facility]: [facility];  Service: Vascular;  Laterality: Left;   BACK SURGERY     CARDIAC CATHETERIZATION     CATARACT EXTRACTION W/ INTRAOCULAR LENS  IMPLANT, BILATERAL Bilateral    CHOLECYSTECTOMY N/A 04/29/2016   Procedure: LAPAROSCOPIC CHOLECYSTECTOMY WITH INTRAOPERATIVE CHOLANGIOGRAM;  Surgeon: Juanita Norlander, MD;  Location: [facility];  Service: General;  Laterality: N/A;   EP IMPLANTABLE DEVICE N/A 11/08/2015   MRI COMPATABLE -- Procedure: Pacemaker Implant;  Surgeon: Verona Goodwill, MD;  [facility];  Service: Cardiovascular;  Laterality: N/A;   INCISION AND DRAINAGE  ABSCESS Right 05/30/2021   Procedure: IRRIGATION AND DEBRIDEMENT OF PREPATELLA BURSA;  Surgeon: Claiborne Crew, MD;  Location: [facility];  Service: Orthopedics;  Laterality: Right;  60 wants standard knee draping and pulse lavage   INCISION AND DRAINAGE ABSCESS Right 07/22/2021   Procedure: INCISION AND DRAINAGE ABSCESS RIGHT KNEE;  Surgeon: Claiborne Crew, MD;  Location: [facility];  Service: Orthopedics;  Laterality: Right;   INSERT / REPLACE / REMOVE PACEMAKER  2017   Dr. Aida House [facility])   IR FLUORO GUIDE CV LINE RIGHT  07/25/2021   IR FLUORO GUIDE CV LINE RIGHT  11/04/2022   IR REMOVAL TUN CV CATH W/O FL  09/04/2021   IR US  GUIDE VASC ACCESS RIGHT  07/25/2021   IR US  GUIDE VASC ACCESS RIGHT  11/04/2022   JOINT REPLACEMENT     LEAD EXTRACTION N/A 09/08/2022   Procedure: LEAD EXTRACTION;  Surgeon: Tammie Fall, MD;  [facility];  Service: Cardiovascular;  Laterality: N/A;   LEAD EXTRACTION N/A 09/11/2022   Procedure: LEAD EXTRACTION;  Surgeon: Tammie Fall, MD;  [facility];  Service: Cardiovascular;  Laterality: N/A;   LUMBAR DISC SURGERY  1990's   x 2   TEE WITHOUT CARDIOVERSION N/A 09/07/2022   Procedure: TRANSESOPHAGEAL ECHOCARDIOGRAM;  Surgeon: Jann Melody, MD;  [facility];  Service: Cardiovascular;  Laterality: N/A;   THYROIDECTOMY  2011   TOTAL KNEE ARTHROPLASTY  07/27/2011   Procedure: TOTAL KNEE ARTHROPLASTY;  Surgeon: Bevin Bucks, MD;  Location: [facility];  Service: Orthopedics;  Laterality: Left;   UPPER ESOPHAGEAL ENDOSCOPIC ULTRASOUND (EUS) N/A 05/14/2016   Procedure: UPPER ESOPHAGEAL ENDOSCOPIC ULTRASOUND (EUS);  Surgeon: Janel Medford, MD;  Location: [facility];  Service: Endoscopy;  Laterality: N/A;  radial only-will be done mod if needed     reports that she quit smoking about 26 years ago. Her smoking use included cigarettes. She started smoking about 59 years ago. She has a  66 pack-year smoking history. She has never  used smokeless tobacco. She reports that she does not drink alcohol  and does not use drugs.  Allergies  Allergen Reactions   Oxycodone  Itching and Other (See Comments)    Pt still takes this med even thought it causes itching   Trelegy Ellipta  [Fluticasone -Umeclidin-Vilant] Nausea And Vomiting, Other (See Comments) and Cough    Made the patient sick to her stomach (only) several days after using it    Trulicity [Dulaglutide] Nausea And Vomiting, Nausea Only, Other (See Comments) and Cough    TRULICITY made the patient sick to her stomach (only) several days after using it    Family History  Problem Relation Age of Onset   Heart Problems Mother    Heart defect Mother        valve problems   Heart Problems Father    Heart disease Brother    Lung cancer Daughter        non small cell    Diabetes Maternal Aunt    Diabetes Maternal Uncle    Throat cancer Brother    Colon cancer Neg Hx    Colon polyps Neg Hx    Esophageal cancer Neg Hx    Rectal cancer Neg Hx    Stomach cancer Neg Hx     Prior to Admission medications   Medication Sig Start Date End Date Taking? Authorizing Provider  acetaminophen  (TYLENOL ) 500 MG tablet Take 1,000-1,500 mg by mouth every 8 (eight) hours as needed for mild pain, moderate pain or headache.   Yes [provider]  albuterol  (PROVENTIL ) (2.5 MG/3ML) 0.083% nebulizer solution Take 3 mLs (2.5 mg total) by nebulization every 2 (two) hours as needed for wheezing or shortness of breath. 05/19/23  Yes Olalere, Adewale A, MD  albuterol  (VENTOLIN  HFA) 108 (90 Base) MCG/ACT inhaler Inhale 2 puffs into the lungs every 6 (six) hours as needed for wheezing or shortness of breath. 07/03/22  Yes Parrett, Tammy S, NP  amLODipine  (NORVASC ) 5 MG tablet Take 2 tablets (10 mg total) by mouth daily. Patient taking differently: Take 5 mg by mouth daily. 09/15/22  Yes Pokhrel, Laxman, MD  apixaban  (ELIQUIS ) 2.5 MG TABS tablet Take 1 tablet (2.5 mg total) by mouth 2 (two)  times daily. 05/19/23  Yes Hugh Madura, MD  ascorbic acid  (VITAMIN C ) 1000 MG tablet Take 1,000 mg by mouth daily with lunch.   Yes [provider]  atorvastatin  (LIPITOR) 40 MG tablet Take 1 tablet (40 mg total) by mouth daily. 05/19/23  Yes Hugh Madura, MD  Budeson-Glycopyrrol-Formoterol  (BREZTRI  AEROSPHERE) 160-9-4.8 MCG/ACT AERO Inhale 2 puffs into the lungs in the morning and at bedtime. 05/19/23  Yes Olalere, Adewale A, MD  Carboxymethylcellulose Sodium (ARTIFICIAL TEARS OP) Place 1 drop into both eyes daily as needed (dry eyes).   Yes [provider]  Doxercalciferol (HECTOROL IV) Doxercalciferol (Hectorol) 12/23/22 12/15/23 Yes [provider]  escitalopram  (LEXAPRO ) 5 MG tablet Take 1 tablet (5 mg total) by mouth daily. 11/14/22  Yes Haydee Lipa, MD  ferrous sulfate  325 (65 FE) MG tablet Take 1 tablet (325 mg total) by mouth daily with breakfast. 01/26/20  Yes Verona Goodwill, MD  fluticasone  (FLONASE ) 50 MCG/ACT nasal spray USE 2 SPRAYS NASALLY DAILY Patient taking differently: Place 1 spray into both nostrils daily as needed for allergies. 02/26/17  Yes Denson Flake, MD  gabapentin  (NEURONTIN ) 800 MG tablet Take 800 mg by mouth 3 (three) times daily.  Yes [provider]  guaiFENesin  (ROBITUSSIN) 100 MG/5ML liquid Take 5 mLs by mouth every 4 (four) hours as needed for cough or to loosen phlegm.   Yes [provider]  HYDROcodone -acetaminophen  (NORCO) 7.5-325 MG tablet Take 1 tablet by mouth 4 (four) times daily. 08/02/23  Yes [provider]  insulin  aspart (NOVOLOG ) 100 UNIT/ML FlexPen Inject 0-20 Units into the skin See admin instructions. Inject 0-20 units into the skin three times a day before meals "as needed," per SLIDING SCALE   Yes [provider]  isosorbide  mononitrate (IMDUR ) 60 MG 24 hr tablet Take 1 tablet (60 mg total) by mouth daily. 05/19/23  Yes Hugh Madura, MD  LANTUS  100 UNIT/ML injection Inject  50 Units into the skin every morning. 03/03/23  Yes [provider]  levothyroxine  (SYNTHROID ) 200 MCG tablet Take 100-200 mcg by mouth See admin instructions. Take 1/2 tablet by mouth every Monday and Friday, then take 1 tablet all other days   Yes [provider]  melatonin 5 MG TABS Take 1 tablet (5 mg total) by mouth at bedtime as needed. 09/15/22  Yes Pokhrel, Laxman, MD  Methoxy PEG-Epoetin Beta (MIRCERA IJ) Mircera 12/11/22 12/10/23 Yes [provider]  nitroGLYCERIN  (NITROSTAT ) 0.4 MG SL tablet Place 1 tablet (0.4 mg total) under the tongue every 5 (five) minutes as needed for chest pain. 05/19/23  Yes Hugh Madura, MD  PRESCRIPTION MEDICATION CPAP- At bedtime   Yes [provider]  promethazine  (PHENERGAN ) 25 MG suppository SMARTSIG:0.5-1 SUPPOS Rectally Every 6 Hours PRN 12/22/22  Yes [provider]  sevelamer  carbonate (RENVELA ) 800 MG tablet Take 1 tablet (800 mg total) by mouth 3 (three) times daily with meals. 11/14/22  Yes Haydee Lipa, MD  torsemide  (DEMADEX ) 100 MG tablet Take 1 tablet (100 mg total) by mouth 2 (two) times daily. Patient taking differently: Take 100 mg by mouth See admin instructions. Take 1 tablet by mouth daily, except Monday and Friday 11/14/22  Yes Haydee Lipa, MD  vitamin B-12 (CYANOCOBALAMIN ) 1000 MCG tablet Take 1,000 mcg by mouth daily with lunch.   Yes [provider]  Vitamin D3 (VITAMIN D ) 25 MCG tablet Take 1,000 Units by mouth daily with lunch.   Yes [provider]  Alcohol  Swabs  (B-D SINGLE USE SWABS  REGULAR) PADS  11/24/19   [provider]  allopurinol  (ZYLOPRIM ) 100 MG tablet Take 100 mg by mouth 2 (two) times daily. Patient not taking: Reported on 08/29/2023 04/16/19   [provider]  Blood Glucose Calibration (TRUE METRIX LEVEL 1) Low SOLN  11/27/19   [provider]  multivitamin (RENA-VIT) TABS tablet Take 1 tablet by mouth at bedtime. Patient not  taking: Reported on 08/29/2023 11/14/22   Haydee Lipa, MD  predniSONE  (DELTASONE ) 20 MG tablet Take 1 tablet (20 mg total) by mouth daily with breakfast. 02/16/23   Margaretann Sharper, MD  TRUE METRIX BLOOD GLUCOSE TEST test strip  09/18/17   [provider]  TRUEplus Lancets 28G MISC  11/24/19   [provider]     Physical Exam: Vitals:   08/29/23 1955 08/29/23 2000 08/29/23 2030 08/29/23 2045  BP: (!) 131/39 (!) 136/46 (!) 154/54 139/79  Pulse: 84 80 92 76  Resp: (!) 23 (!) 22 (!) 24 (!) 21  Temp: (!) 96.6 F (35.9 C)     TempSrc: Temporal     SpO2: 100% 100% 100% 100%    Physical Exam Vitals and nursing note  reviewed.  Constitutional:      General: She is not in acute distress.    Appearance: She is ill-appearing. She is not toxic-appearing.  HENT:     Mouth/Throat:     Mouth: Mucous membranes are moist.  Cardiovascular:     Rate and Rhythm: Normal rate. Rhythm irregular.     Pulses: Normal pulses.     Heart sounds: Normal heart sounds.  Pulmonary:     Effort: Pulmonary effort is normal. No respiratory distress.     Breath sounds: No stridor. No wheezing, rhonchi or rales.  Abdominal:     Palpations: Abdomen is soft.  Musculoskeletal:     Cervical back: Neck supple.     Right lower leg: No edema.     Left lower leg: No edema.  Skin:    Capillary Refill: Capillary refill takes less than 2 seconds.  Neurological:     Mental Status: She is alert and oriented to person, place, and time.  Psychiatric:        Mood and Affect: Mood normal.        Thought Content: Thought content normal.      Labs on Admission: I have personally reviewed following labs and imaging studies  CBC: Recent Labs  Lab 08/29/23 2000 08/29/23 2005 08/29/23 2013  WBC 16.5*  --   --   NEUTROABS 12.2*  --   --   HGB 10.5* 11.2* 9.5*  HCT 34.2* 33.0* 28.0*  MCV 102.1*  --   --   PLT 430*  --   --    Basic Metabolic Panel: Recent Labs  Lab 08/29/23 2000  08/29/23 2005 08/29/23 2013  NA 137 138 138  K 3.7 3.7 3.4*  CL 98  --   --   CO2 25  --   --   GLUCOSE 236*  --   --   BUN 44*  --   --   CREATININE 2.31*  --   --   CALCIUM  8.8*  --   --    GFR: CrCl cannot be calculated (Unknown ideal weight.). Liver Function Tests: Recent Labs  Lab 08/29/23 2000  AST 25  ALT 13  ALKPHOS 78  BILITOT 0.6  PROT 7.1  ALBUMIN 3.0*   No results for input(s): "LIPASE", "AMYLASE" in the last 168 hours. No results for input(s): "AMMONIA" in the last 168 hours. Coagulation Profile: No results for input(s): "INR", "PROTIME" in the last 168 hours. Cardiac Enzymes: Recent Labs  Lab 08/29/23 2000 08/29/23 2209  TROPONINIHS 36* 53*   BNP (last 3 results) Recent Labs    10/25/22 0731 08/29/23 2000  BNP 819.8* 468.9*   HbA1C: No results for input(s): "HGBA1C" in the last 72 hours. CBG: No results for input(s): "GLUCAP" in the last 168 hours. Lipid Profile: No results for input(s): "CHOL", "HDL", "LDLCALC", "TRIG", "CHOLHDL", "LDLDIRECT" in the last 72 hours. Thyroid  Function Tests: No results for input(s): "TSH", "T4TOTAL", "FREET4", "T3FREE", "THYROIDAB" in the last 72 hours. Anemia Panel: No results for input(s): "VITAMINB12", "FOLATE", "FERRITIN", "TIBC", "IRON", "RETICCTPCT" in the last 72 hours. Urine analysis:    Component Value Date/Time   COLORURINE AMBER (A) 11/03/2022 1535   APPEARANCEUR CLOUDY (A) 11/03/2022 1535   LABSPEC 1.019 11/03/2022 1535   PHURINE 5.0 11/03/2022 1535   GLUCOSEU NEGATIVE 11/03/2022 1535   GLUCOSEU NEGATIVE 12/09/2018 0944   HGBUR NEGATIVE 11/03/2022 1535   BILIRUBINUR NEGATIVE 11/03/2022 1535   KETONESUR NEGATIVE 11/03/2022 1535   PROTEINUR >=300 (A) 11/03/2022  1535   UROBILINOGEN 0.2 12/09/2018 0944   NITRITE NEGATIVE 11/03/2022 1535   LEUKOCYTESUR LARGE (A) 11/03/2022 1535    Radiological Exams on Admission: I have personally reviewed images DG Chest Portable 1 View Result Date:  08/29/2023 EXAM: 1 VIEW(S) XRAY OF THE CHEST 08/29/2023 08:26:00 PM COMPARISON: 10/31/2022 and 11/14/2020 CLINICAL HISTORY: Shortness of breath. Bipap. FINDINGS: LUNGS AND PLEURA: Patchy bilateral lower lobe opacities, right greater than left, favoring scarring and/or atelectasis. Small right pleural effusion. No pneumothorax. HEART AND MEDIASTINUM: Thoracic cardiac silhouette. No acute abnormality of the cardiac and mediastinal silhouettes. BONES AND SOFT TISSUES: Surgical clips overlying the bilateral neck. Cervical spinal hardware. No acute osseous abnormality. LINES AND TUBES: Right IJ dual lumen dialysis catheter terminating at the cavoatrial junction in appropriate position. IMPRESSION: 1. Patchy bilateral lower lobe opacities, right greater than left, favoring scarring and/or atelectasis. 2. Small right pleural effusion. Electronically signed by: Zadie Herter MD 08/29/2023 09:08 PM EDT RP Workstation: [id number]     EKG: My personal interpretation of EKG shows: Atrial fibrillation rate controlled    Assessment/Plan: Principal Problem:   COPD with acute exacerbation (HCC) Active Problems:   Respiratory distress   Essential hypertension   Paroxysmal atrial fibrillation (HCC)   Chronic hypoxic respiratory failure (HCC)   ESRD on dialysis MWF schedule (HCC)   Anemia of chronic disease due to ESRD   Hypothyroidism   Chronic pain syndrome   Generalized anxiety disorder    Assessment and Plan: COPD with acute exacerbation Respiratory distress-secondary to COPD exacerbation Chronic hypoxic respiratory failure -Patient has been presented to ED via EMS for respiratory distress found to have COPD exacerbation.  Initially has been placed on BiPAP and treated with Atrovent  and DuoNeb nebulizer, DuoNeb, Solu-Medrol  and mag sulfate.  In the ED patient initially was on BiPAP and eventually has been transition to nasal cannula oxygen  3 L and patient is tolerating well. - ABG no evidence of  respiratory acidosis and hypercarbia. - Patient is afebrile.  Respiratory panel pending.  CBC showing leukocytosis in the setting of his IV Solu-Medrol  use and route to ED. - Chest x-ray showing bilateral lower lobe opacities no evidence of pneumonia. - Continue to treat for COPD exacerbation. - Continue IV Solu-Medrol  60 mg daily, budesonide  twice daily, DuoNeb every 6 hour and Xopenex as needed.  Continue IV azithromycin  500 mg for 5 days. - Continue supplemental oxygen  goal to keep O2 sat above 92%   Essential hypertension -Blood pressure within good range.  Elevated BNP 468. - Chest x-ray evidence of volume overload.  Last dialysis on Friday.  No history of missing dialysis session - Continue amlodipine  5 mg daily, Imdur  and and torsemide  100 mg twice daily except Monday and Friday.   Paroxysmal atrial fibrillation - EKG showing paroxysmal atrial fibrillation rate controlled to 87.  At home patient is not on any rate control medication. - Continue Eliquis  2.5 mg twice daily.  Elevated troponin - Elevated troponin 36.  Which is around baseline.  Chronically elevated troponin due to decreased renal clearance and dialysis patient. - Patient denies any chest pain and pressure.  Elevated troponin in the setting of respiratory distress and ESRD patient.  Continue cardiac monitoring.  Do not need to pursue further troponin check.   ESRD on HD MWF schedule -Dialysis dependent since 2024.  Last dialysis on Friday.  No history of missing dialysis session.  Currently no evidence of volume overload. - Continue torsemide  100 mg twice daily. [facility] nephrology and informed Dr.  Zelda Hickman for dialysis for tomorrow.  Anemia of chronic disease -Stable H&H.  Continue oral iron and vitamin B12 supplement  Hypothyroidism -Continue levothyroxine  200 mcg daily  Generalized anxiety disorder -Continue Lexapro .  Chronic pain syndrome -Continue gabapentin  and home Norco same dose every 6 hour as  needed.  Insulin -dependent DM type II Continue Lantus  50 unit in the morning and sliding scale insulin  with mealtime.  Peripheral neuropathy - Continue gabapentin   DVT prophylaxis:  Eliquis  Code Status:  DNR/DNI(Do NOT Intubate) Diet: Renal and carb modified diet Family Communication:   Family was present at bedside, at the time of interview. Opportunity was given to ask question and all questions were answered satisfactorily.  Disposition Plan: Monitor improvement of COPD exacerbation. Consults: Nephrology Admission status:   Inpatient, Telemetry bed  Severity of Illness: The appropriate patient status for this patient is INPATIENT. Inpatient status is judged to be reasonable and necessary in order to provide the required intensity of service to ensure the patient's safety. The patient's presenting symptoms, physical exam findings, and initial radiographic and laboratory data in the context of their chronic comorbidities is felt to place them at high risk for further clinical deterioration. Furthermore, it is not anticipated that the patient will be medically stable for discharge from the hospital within 2 midnights of admission.   * I certify that at the point of admission it is my clinical judgment that the patient will require inpatient hospital care spanning beyond 2 midnights from the point of admission due to high intensity of service, high risk for further deterioration and high frequency of surveillance required.Aaron Aas    Lorraina Spring, MD [facility]  How to contact the [facility] Attending or Consulting provider 7A - 7P or covering provider during after hours 7P -7A, for this patient.  Check the care team in [facility] and look for a) attending/consulting [facility] provider listed and b) the [facility] team listed Log into [email or web address] and use [facility]'s universal password to access. If you do not have the password, please contact the hospital operator. Locate the [facility] provider you are looking  for under [facility] and page to a number that you can be directly reached. If you still have difficulty reaching the provider, please page the [facility] (Director on Call) for the Hospitalists listed on amion for assistance.  08/29/2023, 10:58 PM

## 2023-08-29 NOTE — ED Provider Notes (Signed)
 [place] EMERGENCY DEPARTMENT AT [facility] Provider Note   CSN: [id number] Arrival date & time: 08/29/23  1951     History  Chief Complaint  Patient presents with   Respiratory Distress   HPI  Kayla [name] is a 83 y.o. female with past medical history MRSA bacteremia/infective endocarditis, CAD, CKD dialysis Monday and Friday, COPD, type 2 diabetes, hypertension, lipidemia presents with shortness of breath.  Patient reportedly became short of breath today at home just a few hours prior to arrival.  EMS noted patient to be in extremis on arrival, she was placed on BiPAP and given albuterol , Atrovent , magnesium , Solu-Medrol , and IM epi.  Patient's last dialysis was this Friday.  Husband later presents to bedside, tells me that patient had some shortness of breath after church this morning that had progressively worsened.  She has been taking all her medications as prescribed, including her Eliquis  and inhalers.  Denies any fevers.  Does endorse having cough.  HPI     Home Medications Prior to Admission medications   Medication Sig Start Date End Date Taking? Authorizing Provider  acetaminophen  (TYLENOL ) 500 MG tablet Take 1,000-1,500 mg by mouth every 8 (eight) hours as needed for mild pain, moderate pain or headache.   Yes [provider]  albuterol  (PROVENTIL ) (2.5 MG/3ML) 0.083% nebulizer solution Take 3 mLs (2.5 mg total) by nebulization every 2 (two) hours as needed for wheezing or shortness of breath. 05/19/23  Yes Olalere, Adewale A, MD  albuterol  (VENTOLIN  HFA) 108 (90 Base) MCG/ACT inhaler Inhale 2 puffs into the lungs every 6 (six) hours as needed for wheezing or shortness of breath. 07/03/22  Yes Parrett, Tammy S, NP  amLODipine  (NORVASC ) 5 MG tablet Take 2 tablets (10 mg total) by mouth daily. Patient taking differently: Take 5 mg by mouth daily. 09/15/22  Yes Pokhrel, Laxman, MD  apixaban  (ELIQUIS ) 2.5 MG TABS tablet Take 1 tablet (2.5 mg total) by  mouth 2 (two) times daily. 05/19/23  Yes Hugh Madura, MD  ascorbic acid  (VITAMIN C ) 1000 MG tablet Take 1,000 mg by mouth daily with lunch.   Yes [provider]  atorvastatin  (LIPITOR) 40 MG tablet Take 1 tablet (40 mg total) by mouth daily. 05/19/23  Yes Hugh Madura, MD  Budeson-Glycopyrrol-Formoterol  (BREZTRI  AEROSPHERE) 160-9-4.8 MCG/ACT AERO Inhale 2 puffs into the lungs in the morning and at bedtime. 05/19/23  Yes Olalere, Adewale A, MD  Carboxymethylcellulose Sodium (ARTIFICIAL TEARS OP) Place 1 drop into both eyes daily as needed (dry eyes).   Yes [provider]  Doxercalciferol (HECTOROL IV) Doxercalciferol (Hectorol) 12/23/22 12/15/23 Yes [provider]  escitalopram  (LEXAPRO ) 5 MG tablet Take 1 tablet (5 mg total) by mouth daily. 11/14/22  Yes Haydee Lipa, MD  ferrous sulfate  325 (65 FE) MG tablet Take 1 tablet (325 mg total) by mouth daily with breakfast. 01/26/20  Yes Verona Goodwill, MD  fluticasone  (FLONASE ) 50 MCG/ACT nasal spray USE 2 SPRAYS NASALLY DAILY Patient taking differently: Place 1 spray into both nostrils daily as needed for allergies. 02/26/17  Yes Denson Flake, MD  gabapentin  (NEURONTIN ) 800 MG tablet Take 800 mg by mouth 3 (three) times daily.   Yes [provider]  guaiFENesin  (ROBITUSSIN) 100 MG/5ML liquid Take 5 mLs by mouth every 4 (four) hours as needed for cough or to loosen phlegm.   Yes [provider]  HYDROcodone -acetaminophen  (NORCO) 7.5-325 MG tablet Take 1 tablet by mouth 4 (four) times daily. 08/02/23  Yes [provider]  insulin  aspart (NOVOLOG ) 100 UNIT/ML FlexPen Inject 0-20 Units into the skin See admin instructions. Inject 0-20 units into the skin three times a day before meals "as needed," per SLIDING SCALE   Yes [provider]  isosorbide  mononitrate (IMDUR ) 60 MG 24 hr tablet Take 1 tablet (60 mg total) by mouth daily. 05/19/23  Yes Hugh Madura, MD  LANTUS  100 UNIT/ML  injection Inject 50 Units into the skin every morning. 03/03/23  Yes [provider]  levothyroxine  (SYNTHROID ) 200 MCG tablet Take 100-200 mcg by mouth See admin instructions. Take 1/2 tablet by mouth every Monday and Friday, then take 1 tablet all other days   Yes [provider]  melatonin 5 MG TABS Take 1 tablet (5 mg total) by mouth at bedtime as needed. 09/15/22  Yes Pokhrel, Laxman, MD  Methoxy PEG-Epoetin Beta (MIRCERA IJ) Mircera 12/11/22 12/10/23 Yes [provider]  nitroGLYCERIN  (NITROSTAT ) 0.4 MG SL tablet Place 1 tablet (0.4 mg total) under the tongue every 5 (five) minutes as needed for chest pain. 05/19/23  Yes Hugh Madura, MD  PRESCRIPTION MEDICATION CPAP- At bedtime   Yes [provider]  promethazine  (PHENERGAN ) 25 MG suppository SMARTSIG:0.5-1 SUPPOS Rectally Every 6 Hours PRN 12/22/22  Yes [provider]  sevelamer  carbonate (RENVELA ) 800 MG tablet Take 1 tablet (800 mg total) by mouth 3 (three) times daily with meals. 11/14/22  Yes Haydee Lipa, MD  torsemide  (DEMADEX ) 100 MG tablet Take 1 tablet (100 mg total) by mouth 2 (two) times daily. Patient taking differently: Take 100 mg by mouth See admin instructions. Take 1 tablet by mouth daily, except Monday and Friday 11/14/22  Yes Haydee Lipa, MD  vitamin B-12 (CYANOCOBALAMIN ) 1000 MCG tablet Take 1,000 mcg by mouth daily with lunch.   Yes [provider]  Vitamin D3 (VITAMIN D ) 25 MCG tablet Take 1,000 Units by mouth daily with lunch.   Yes [provider]  Alcohol  Swabs  (B-D SINGLE USE SWABS  REGULAR) PADS  11/24/19   [provider]  allopurinol  (ZYLOPRIM ) 100 MG tablet Take 100 mg by mouth 2 (two) times daily. Patient not taking: Reported on 08/29/2023 04/16/19   [provider]  Blood Glucose Calibration (TRUE METRIX LEVEL 1) Low SOLN  11/27/19   [provider]  multivitamin (RENA-VIT) TABS tablet Take 1 tablet by mouth at  bedtime. Patient not taking: Reported on 08/29/2023 11/14/22   Haydee Lipa, MD  predniSONE  (DELTASONE ) 20 MG tablet Take 1 tablet (20 mg total) by mouth daily with breakfast. 02/16/23   Margaretann Sharper, MD  TRUE METRIX BLOOD GLUCOSE TEST test strip  09/18/17   [provider]  TRUEplus Lancets 28G MISC  11/24/19   [provider]      Allergies    Oxycodone , Trelegy ellipta  [fluticasone -umeclidin-vilant], and Trulicity [dulaglutide]    Review of Systems   Review of Systems  Physical Exam Updated Vital Signs BP 139/79   Pulse 76   Temp (!) 96.6 F (35.9 C) (Temporal)   Resp (!) 21   SpO2 100%  Physical Exam Vitals and nursing note reviewed.  Constitutional:      General: She is not in acute distress.    Appearance: She is well-developed.  HENT:     Head: Normocephalic and atraumatic.     Right Ear: External ear normal.     Left Ear: External ear normal.     Nose: Nose normal.  Mouth/Throat:     Mouth: Mucous membranes are moist.  Eyes:     Conjunctiva/sclera: Conjunctivae normal.  Cardiovascular:     Rate and Rhythm: Normal rate and regular rhythm.     Heart sounds: No murmur heard. Pulmonary:     Breath sounds: No wheezing or rales.     Comments: On BiPAP, tachypneic Good aeration bilaterally with rhonchi, no obvious crackles Abdominal:     Palpations: Abdomen is soft.     Tenderness: There is no abdominal tenderness. There is no guarding or rebound.  Musculoskeletal:        General: No swelling.     Cervical back: Neck supple.     Right lower leg: No edema.     Left lower leg: No edema.  Skin:    General: Skin is warm and dry.     Capillary Refill: Capillary refill takes less than 2 seconds.  Neurological:     Mental Status: She is alert.  Psychiatric:        Mood and Affect: Mood normal.     ED Results / Procedures / Treatments   Labs (all labs ordered are listed, but only abnormal results are displayed) Labs Reviewed  CBC  WITH DIFFERENTIAL/PLATELET - Abnormal; Notable for the following components:      Result Value   WBC 16.5 (*)    RBC 3.35 (*)    Hemoglobin 10.5 (*)    HCT 34.2 (*)    MCV 102.1 (*)    Platelets 430 (*)    Neutro Abs 12.2 (*)    Monocytes Absolute 1.2 (*)    Abs Immature Granulocytes 0.09 (*)    All other components within normal limits  COMPREHENSIVE METABOLIC PANEL WITH GFR - Abnormal; Notable for the following components:   Glucose, Bld 236 (*)    BUN 44 (*)    Creatinine, Ser 2.31 (*)    Calcium  8.8 (*)    Albumin 3.0 (*)    GFR, Estimated 21 (*)    All other components within normal limits  BRAIN NATRIURETIC PEPTIDE - Abnormal; Notable for the following components:   B Natriuretic Peptide 468.9 (*)    All other components within normal limits  I-STAT VENOUS BLOOD GAS, ED - Abnormal; Notable for the following components:   pO2, Ven 48 (*)    Bicarbonate 30.4 (*)    Acid-Base Excess 5.0 (*)    Calcium , Ion 1.04 (*)    HCT 33.0 (*)    Hemoglobin 11.2 (*)    All other components within normal limits  I-STAT ARTERIAL BLOOD GAS, ED - Abnormal; Notable for the following components:   pCO2 arterial 49.2 (*)    pO2, Arterial 162 (*)    Bicarbonate 29.3 (*)    Acid-Base Excess 3.0 (*)    Potassium 3.4 (*)    Calcium , Ion 1.14 (*)    HCT 28.0 (*)    Hemoglobin 9.5 (*)    All other components within normal limits  TROPONIN I (HIGH SENSITIVITY) - Abnormal; Notable for the following components:   Troponin I (High Sensitivity) 36 (*)    All other components within normal limits  BLOOD GAS, ARTERIAL  TROPONIN I (HIGH SENSITIVITY)    EKG None  Radiology DG Chest Portable 1 View Result Date: 08/29/2023 EXAM: 1 VIEW(S) XRAY OF THE CHEST 08/29/2023 08:26:00 PM COMPARISON: 10/31/2022 and 11/14/2020 CLINICAL HISTORY: Shortness of breath. Bipap. FINDINGS: LUNGS AND PLEURA: Patchy bilateral lower lobe opacities, right greater than left, favoring scarring  and/or atelectasis. Small  right pleural effusion. No pneumothorax. HEART AND MEDIASTINUM: Thoracic cardiac silhouette. No acute abnormality of the cardiac and mediastinal silhouettes. BONES AND SOFT TISSUES: Surgical clips overlying the bilateral neck. Cervical spinal hardware. No acute osseous abnormality. LINES AND TUBES: Right IJ dual lumen dialysis catheter terminating at the cavoatrial junction in appropriate position. IMPRESSION: 1. Patchy bilateral lower lobe opacities, right greater than left, favoring scarring and/or atelectasis. 2. Small right pleural effusion. Electronically signed by: Zadie Herter MD 08/29/2023 09:08 PM EDT RP Workstation: [id number]    Procedures Procedures    Medications Ordered in ED Medications  azithromycin  (ZITHROMAX ) 500 mg in sodium chloride  0.9 % 250 mL IVPB (500 mg Intravenous New Bag/Given 08/29/23 2209)    ED Course/ Medical Decision Making/ A&P                                 Medical Decision Making Amount and/or Complexity of Data Reviewed Labs: ordered. Radiology: ordered.   Patient is alert, afebrile, and in respiratory distress.  She is on BiPAP, noted to be tachypneic with good aeration bilaterally, rhonchi present.  Bedside POCUS with B-lines noted at the base of the lungs, otherwise lung sliding is present with no B-lines elsewhere.  Initial BP on arrival 140/50.  Greatest concern at this time is COPD exacerbation but also considering CHF exacerbation, flash pulmonary edema, pneumonia, PE, amongst other diagnoses.  Will obtain VBG, CBC, CMP, troponin, BNP, chest x-ray.  Placed on BiPAP, 8/5 at 50%.    Workup resulted with ABG demonstrating normal pH, PCO250, bicarb 30.  CBC with mild leukocytosis of 16.5, hemoglobin 10.5, CMP with BUN and creatinine near patient's baseline.  First troponin is 36, prior troponins have been elevated, believe this is type II in nature and due to poor renal clearance.  I personally interpreted patient's EKG, which demonstrated atrial  fibrillation with rate of 81, no ST elevations or depressions, no T wave inversions in concordant leads, overall no acute ischemic changes.  I personally interpreted patient's chest x-ray, which demonstrated lower lobe opacities, atelectasis versus consolidation.  Patient was ordered azithromycin .  After being on BiPAP for about 2 hours, patient was trialed off.  On my reevaluations, she is saturating well on 3 L, nontachypneic, can speak in full sentences without difficulty.  No subjective shortness of breath.  I do believe she is experiencing a COPD exacerbation, requires admission for further monitoring and management as her symptoms continue to improve.  She is agreeable to this plan.  Handoff was given to admitting team.  Patient seen in conjunction with Dr. Ranelle Buys, who agreed with the above work-up and plan of care.         Final Clinical Impression(s) / ED Diagnoses Final diagnoses:  COPD exacerbation [facility])    Rx / DC Orders ED Discharge Orders     None         Lorain Robson, MD 08/29/23 2234    Sallyanne Creamer, DO 09/06/23 1137

## 2023-08-30 DIAGNOSIS — J441 Chronic obstructive pulmonary disease with (acute) exacerbation: Secondary | ICD-10-CM | POA: Diagnosis not present

## 2023-08-30 LAB — RESPIRATORY PANEL BY PCR

## 2023-08-30 LAB — CBC
HCT: 31.3 % — ABNORMAL LOW (ref 36.0–46.0)
Hemoglobin: 9.7 g/dL — ABNORMAL LOW (ref 12.0–15.0)
MCH: 31.6 pg (ref 26.0–34.0)
MCHC: 31 g/dL (ref 30.0–36.0)
MCV: 102 fL — ABNORMAL HIGH (ref 80.0–100.0)
Platelets: 401 10*3/uL — ABNORMAL HIGH (ref 150–400)
RBC: 3.07 MIL/uL — ABNORMAL LOW (ref 3.87–5.11)
RDW: 15.1 % (ref 11.5–15.5)
WBC: 16.3 10*3/uL — ABNORMAL HIGH (ref 4.0–10.5)
nRBC: 0 % (ref 0.0–0.2)

## 2023-08-30 LAB — COMPREHENSIVE METABOLIC PANEL WITH GFR
ALT: 15 U/L (ref 0–44)
AST: 20 U/L (ref 15–41)
Albumin: 2.5 g/dL — ABNORMAL LOW (ref 3.5–5.0)
Alkaline Phosphatase: 76 U/L (ref 38–126)
Anion gap: 13 (ref 5–15)
BUN: 46 mg/dL — ABNORMAL HIGH (ref 8–23)
CO2: 26 mmol/L (ref 22–32)
Calcium: 8.9 mg/dL (ref 8.9–10.3)
Chloride: 99 mmol/L (ref 98–111)
Creatinine, Ser: 2.57 mg/dL — ABNORMAL HIGH (ref 0.44–1.00)
GFR, Estimated: 18 mL/min — ABNORMAL LOW (ref >60)
Glucose, Bld: 302 mg/dL — ABNORMAL HIGH (ref 70–99)
Potassium: 4.3 mmol/L (ref 3.5–5.1)
Sodium: 138 mmol/L (ref 135–145)
Total Bilirubin: 0.6 mg/dL (ref 0.0–1.2)
Total Protein: 6.7 g/dL (ref 6.5–8.1)

## 2023-08-30 LAB — CBG MONITORING, ED
Glucose-Capillary: 289 mg/dL — ABNORMAL HIGH (ref 70–99)
Glucose-Capillary: 374 mg/dL — ABNORMAL HIGH (ref 70–99)

## 2023-08-30 MED ORDER — AMLODIPINE BESYLATE 5 MG PO TABS: 5.0000 mg | ORAL_TABLET | Freq: Every day | ORAL | Status: DC

## 2023-08-30 MED ORDER — PREDNISONE 20 MG PO TABS: 20.0000 mg | ORAL_TABLET | Freq: Every day | ORAL | 0 refills | Status: AC

## 2023-08-30 MED ORDER — GUAIFENESIN-DM 100-10 MG/5ML PO SYRP: 5.0000 mL | ORAL_SOLUTION | ORAL | 0 refills | Status: DC | PRN

## 2023-08-30 MED ORDER — BENZONATATE 100 MG PO CAPS: 100.0000 mg | ORAL_CAPSULE | Freq: Three times a day (TID) | ORAL | 0 refills | Status: DC

## 2023-08-30 MED ORDER — DOXYCYCLINE HYCLATE 100 MG PO TABS: 100.0000 mg | ORAL_TABLET | Freq: Two times a day (BID) | ORAL | 0 refills | Status: AC

## 2023-08-30 NOTE — Progress Notes (Signed)
 Contacted by nephrologist with request for pt to receive out-pt HD treatment at clinic tomorrow to make up pt's missed appt for today. Contacted [facility] and spoke to Consulting civil engineer. Clinic can treat pt tomorrow. Pt will need to arrive at 10:00 am for 10:15 am chair time. Spoke to pt's husband via phone. Husband aware and agreeable to HD appt tomorrow. Appt added to AVS as well. Update provided to attending and nephrologist. Clinic aware to expect pt tomorrow.   Lauraine Polite Renal Navigator [phone]

## 2023-08-30 NOTE — Discharge Summary (Signed)
 Physician Discharge Summary  IYLA MOCKABEE IEP:[id number] DOB: [birth year] DOA: 08/29/2023  PCP: Annette Barters, MD  Admit date: 08/29/2023 Discharge date: 08/30/2023  Admitted From: Home  Discharge disposition: Home    Recommendations for Outpatient Follow-Up:   Follow up with your primary care provider in one week.  Check CBC, BMP, magnesium  in the next visit  Discharge Diagnosis:   Principal Problem:   COPD with acute exacerbation (HCC) Active Problems:   Respiratory distress   Essential hypertension   Paroxysmal atrial fibrillation (HCC)   Chronic hypoxic respiratory failure (HCC)   ESRD on dialysis MWF schedule (HCC)   Anemia of chronic disease due to ESRD   Hypothyroidism   Chronic pain syndrome   Generalized anxiety disorder   Discharge Condition: Improved.  Diet recommendation: Low sodium, heart healthy.  Carbohydrate-modified.    Wound care: None.  Code status: Full.   History of Present Illness:   Kayla [name] is a 83 y.o. female with medical history significant of ESRD on HD MWF schedule, COPD, chronic hypoxic respiratory failure 3 L oxygen  at baseline, paroxysmal atrial fibrillation on Eliquis , anemia of chronic disease, essential hypertension, DM type II, hyperlipidemia, hypohyroidism, and history of MRSA endocarditis presented to the hospital brought in by EMS for respiratory distress.  Patient was initially put on BiPAP and received bronchodilators magnesium  Solu-Medrol  and IM epinephrine  and was brought into the hospital for further evaluation and treatment.  Patient stated she has not missed her hemodialysis.   In the ED, patient was transition to nasal cannula oxygen .  CBC showed WBC at 16.5.  BNP elevated at 468.ABG showing pH 7.3, pCO2 49, PO2 162 and bicarb 29.  EKG showed atrial fibrillation rate controlled to 81.  Chest x-ray showed patchy bilateral lower lobe opacities favoring atelectasis/scarring.  Small right pleural effusion. Patient  was then considered for admission to hospital for further evaluation and treatment.   Hospital Course:   Following conditions were addressed during hospitalization as listed below,  COPD with acute exacerbation Chronic hypoxic respiratory failure Initially presented with a respite distress requiring transient BiPAP.  During hospitalization patient received steroids nebulizers and empiric Zithromax .  Chest x-ray with bilateral opacities but no definite evidence of pneumonia.  Leukocytosis in the context of IV Solu-Medrol .   Patient is on 3 L of oxygen  and is at baseline.  Respiratory viral panel negative.  Will continue oral prednisone  for the next few days and doxycycline  to complete course for possible underlying bronchitis.    Essential hypertension Continue amlodipine  5 mg daily, Imdur  and and torsemide  100 mg twice daily except Monday and Friday.  Will resume on discharge.    Paroxysmal atrial fibrillation Not on any rate controlling medication.  Continue Eliquis .  Rate is controlled at this time.   Elevated troponin Flat without any chest pain or EKG changes.  No further workup needed.     ESRD on HD MWF schedule On torsemide .  Will continue on discharge.  Communicated with nephrology who has plans for outpatient hemodialysis tomorrow.  Anemia of chronic disease Latest hemoglobin of 9.7.  Continue oral iron and vitamin B12 supplement   Hypothyroidism Continue Synthroid    Generalized anxiety disorder Continue Lexapro    Chronic pain syndrome Continue gabapentin  and Norco   Insulin -dependent DM type II Continue Lantus  and sliding scale insulin .  Latest hemoglobin A1c of 6.7.   Peripheral neuropathy Continue gabapentin   Disposition.  At this time, patient is stable for disposition home with outpatient PCP and nephrology  follow-up.  Patient's spouse at bedside.  Medical Consultants:   Nephrology  Procedures:    Transient BiPAP Subjective:   Today, patient was seen  and examined at bedside.  Denies any nausea, vomiting, fever, chest pain, cough or congestion.  Wants to go home.  On baseline oxygen  requirement  Discharge Exam:   Vitals:   08/30/23 1130 08/30/23 1215  BP: (!) 178/76 109/75  Pulse: (!) 56 89  Resp: (!) 25 20  Temp:  98 F (36.7 C)  SpO2: 96% 100%   Vitals:   08/30/23 0825 08/30/23 0830 08/30/23 1130 08/30/23 1215  BP: (!) 148/48 (!) 165/67 (!) 178/76 109/75  Pulse: 75 66 (!) 56 89  Resp: 18 (!) 24 (!) 25 20  Temp: 97.7 F (36.5 C)   98 F (36.7 C)  TempSrc: Oral   Axillary  SpO2: 100% 100% 96% 100%    General: Alert awake, not in obvious distress, on nasal cannula oxygen , elderly female. HENT: pupils equally reacting to light,  No scleral pallor or icterus noted. Oral mucosa is moist.  Right internal jugular hemodialysis catheter Chest:    Diminished breath sounds bilaterally. No crackles or wheezes.  CVS: S1 &S2 heard. No murmur.  Irregular rhythm. Abdomen: Soft, nontender, nondistended.  Bowel sounds are heard.   Extremities: No cyanosis, clubbing or edema.  Peripheral pulses are palpable.  Left upper extremity fistula in place Psych: Alert, awake and oriented, normal mood CNS:  No cranial nerve deficits.  Power equal in all extremities.  Moves all extremities. Skin: Warm and dry.  No rashes noted.  The results of significant diagnostics from this hospitalization (including imaging, microbiology, ancillary and laboratory) are listed below for reference.     Diagnostic Studies:   DG Chest Portable 1 View Result Date: 08/29/2023 EXAM: 1 VIEW(S) XRAY OF THE CHEST 08/29/2023 08:26:00 PM COMPARISON: 10/31/2022 and 11/14/2020 CLINICAL HISTORY: Shortness of breath. Bipap. FINDINGS: LUNGS AND PLEURA: Patchy bilateral lower lobe opacities, right greater than left, favoring scarring and/or atelectasis. Small right pleural effusion. No pneumothorax. HEART AND MEDIASTINUM: Thoracic cardiac silhouette. No acute abnormality of the  cardiac and mediastinal silhouettes. BONES AND SOFT TISSUES: Surgical clips overlying the bilateral neck. Cervical spinal hardware. No acute osseous abnormality. LINES AND TUBES: Right IJ dual lumen dialysis catheter terminating at the cavoatrial junction in appropriate position. IMPRESSION: 1. Patchy bilateral lower lobe opacities, right greater than left, favoring scarring and/or atelectasis. 2. Small right pleural effusion. Electronically signed by: Zadie Herter MD 08/29/2023 09:08 PM EDT RP Workstation: [id number]     Labs:   Basic Metabolic Panel: Recent Labs  Lab 08/29/23 2000 08/29/23 2005 08/29/23 2013 08/30/23 0416  NA 137 138 138 138  K 3.7 3.7 3.4* 4.3  CL 98  --   --  99  CO2 25  --   --  26  GLUCOSE 236*  --   --  302*  BUN 44*  --   --  46*  CREATININE 2.31*  --   --  2.57*  CALCIUM  8.8*  --   --  8.9   GFR CrCl cannot be calculated (Unknown ideal weight.). Liver Function Tests: Recent Labs  Lab 08/29/23 2000 08/30/23 0416  AST 25 20  ALT 13 15  ALKPHOS 78 76  BILITOT 0.6 0.6  PROT 7.1 6.7  ALBUMIN 3.0* 2.5*   No results for input(s): "LIPASE", "AMYLASE" in the last 168 hours. No results for input(s): "AMMONIA" in the last 168 hours. Coagulation profile No results  for input(s): "INR", "PROTIME" in the last 168 hours.  CBC: Recent Labs  Lab 08/29/23 2000 08/29/23 2005 08/29/23 2013 08/30/23 0416  WBC 16.5*  --   --  16.3*  NEUTROABS 12.2*  --   --   --   HGB 10.5* 11.2* 9.5* 9.7*  HCT 34.2* 33.0* 28.0* 31.3*  MCV 102.1*  --   --  102.0*  PLT 430*  --   --  401*   Cardiac Enzymes: No results for input(s): "CKTOTAL", "CKMB", "CKMBINDEX", "TROPONINI" in the last 168 hours. BNP: Invalid input(s): "POCBNP" CBG: Recent Labs  Lab 08/29/23 2339 08/30/23 0826 08/30/23 1154  GLUCAP 236* 289* 374*   D-Dimer No results for input(s): "DDIMER" in the last 72 hours. Hgb A1c Recent Labs    08/29/23 2329  HGBA1C 6.7*   Lipid Profile No  results for input(s): "CHOL", "HDL", "LDLCALC", "TRIG", "CHOLHDL", "LDLDIRECT" in the last 72 hours. Thyroid  function studies No results for input(s): "TSH", "T4TOTAL", "T3FREE", "THYROIDAB" in the last 72 hours.  Invalid input(s): "FREET3" Anemia work up No results for input(s): "VITAMINB12", "FOLATE", "FERRITIN", "TIBC", "IRON", "RETICCTPCT" in the last 72 hours. Microbiology Recent Results (from the past 240 hours)  Respiratory (~20 pathogens) panel by PCR     Status: None   Collection Time: 08/29/23 11:24 PM   Specimen: Nasopharyngeal Swab; Respiratory  Result Value Ref Range Status   Adenovirus NOT DETECTED NOT DETECTED Final   Coronavirus 229E NOT DETECTED NOT DETECTED Final    Comment: (NOTE) The Coronavirus on the Respiratory Panel, DOES NOT test for the novel  Coronavirus (2019 nCoV)    Coronavirus HKU1 NOT DETECTED NOT DETECTED Final   Coronavirus NL63 NOT DETECTED NOT DETECTED Final   Coronavirus OC43 NOT DETECTED NOT DETECTED Final   Metapneumovirus NOT DETECTED NOT DETECTED Final   Rhinovirus / Enterovirus NOT DETECTED NOT DETECTED Final   Influenza A NOT DETECTED NOT DETECTED Final   Influenza B NOT DETECTED NOT DETECTED Final   Parainfluenza Virus 1 NOT DETECTED NOT DETECTED Final   Parainfluenza Virus 2 NOT DETECTED NOT DETECTED Final   Parainfluenza Virus 3 NOT DETECTED NOT DETECTED Final   Parainfluenza Virus 4 NOT DETECTED NOT DETECTED Final   Respiratory Syncytial Virus NOT DETECTED NOT DETECTED Final   Bordetella pertussis NOT DETECTED NOT DETECTED Final   Bordetella Parapertussis NOT DETECTED NOT DETECTED Final   Chlamydophila pneumoniae NOT DETECTED NOT DETECTED Final   Mycoplasma pneumoniae NOT DETECTED NOT DETECTED Final    Comment: Performed at [facility], [address] [place], [place] [address]     Discharge Instructions:   Discharge Instructions     Call MD for:  difficulty breathing, headache or visual disturbances   Complete by: As  directed    Call MD for:  persistant nausea and vomiting   Complete by: As directed    Call MD for:  temperature >100.4   Complete by: As directed    Diet - low sodium heart healthy   Complete by: As directed    Discharge instructions   Complete by: As directed    Follow-up with your primary care provider in 1 week.  Check blood work at that time.  Seek medical attention for worsening symptoms.  Take medications including antibiotics and steroids as prescribed.   Increase activity slowly   Complete by: As directed       Allergies as of 08/30/2023       Reactions   Oxycodone  Itching, Other (See  Comments)   Pt still takes this med even thought it causes itching   Trelegy Ellipta  [fluticasone -umeclidin-vilant] Nausea And Vomiting, Other (See Comments), Cough   Made the patient sick to her stomach (only) several days after using it    Trulicity [dulaglutide] Nausea And Vomiting, Nausea Only, Other (See Comments), Cough   TRULICITY made the patient sick to her stomach (only) several days after using it        Medication List     STOP taking these medications    allopurinol  100 MG tablet Commonly known as: ZYLOPRIM    multivitamin Tabs tablet       TAKE these medications    acetaminophen  500 MG tablet Commonly known as: TYLENOL  Take 1,000-1,500 mg by mouth every 8 (eight) hours as needed for mild pain, moderate pain or headache.   albuterol  108 (90 Base) MCG/ACT inhaler Commonly known as: VENTOLIN  HFA Inhale 2 puffs into the lungs every 6 (six) hours as needed for wheezing or shortness of breath.   albuterol  (2.5 MG/3ML) 0.083% nebulizer solution Commonly known as: PROVENTIL  Take 3 mLs (2.5 mg total) by nebulization every 2 (two) hours as needed for wheezing or shortness of breath.   amLODipine  5 MG tablet Commonly known as: NORVASC  Take 1 tablet (5 mg total) by mouth daily.   apixaban  2.5 MG Tabs tablet Commonly known as: Eliquis  Take 1 tablet (2.5 mg total) by  mouth 2 (two) times daily.   ARTIFICIAL TEARS OP Place 1 drop into both eyes daily as needed (dry eyes).   ascorbic acid  1000 MG tablet Commonly known as: VITAMIN C  Take 1,000 mg by mouth daily with lunch.   atorvastatin  40 MG tablet Commonly known as: LIPITOR Take 1 tablet (40 mg total) by mouth daily.   B-D SINGLE USE SWABS  REGULAR Pads   benzonatate  100 MG capsule Commonly known as: TESSALON  Take 1 capsule (100 mg total) by mouth 3 (three) times daily.   Breztri  Aerosphere 160-9-4.8 MCG/ACT Aero inhaler Generic drug: budeson-glycopyrrolate -formoterol  Inhale 2 puffs into the lungs in the morning and at bedtime.   cyanocobalamin  1000 MCG tablet Commonly known as: VITAMIN B12 Take 1,000 mcg by mouth daily with lunch.   doxycycline  100 MG tablet Commonly known as: VIBRA -TABS Take 1 tablet (100 mg total) by mouth 2 (two) times daily for 3 days.   escitalopram  5 MG tablet Commonly known as: LEXAPRO  Take 1 tablet (5 mg total) by mouth daily.   ferrous sulfate  325 (65 FE) MG tablet Take 1 tablet (325 mg total) by mouth daily with breakfast.   fluticasone  50 MCG/ACT nasal spray Commonly known as: FLONASE  USE 2 SPRAYS NASALLY DAILY What changed:  how much to take how to take this when to take this reasons to take this additional instructions   gabapentin  800 MG tablet Commonly known as: NEURONTIN  Take 800 mg by mouth 3 (three) times daily.   guaiFENesin  100 MG/5ML liquid Commonly known as: ROBITUSSIN Take 5 mLs by mouth every 4 (four) hours as needed for cough or to loosen phlegm.   guaiFENesin -dextromethorphan  100-10 MG/5ML syrup Commonly known as: ROBITUSSIN DM Take 5 mLs by mouth every 4 (four) hours as needed for cough.   HECTOROL IV Doxercalciferol (Hectorol)   HYDROcodone -acetaminophen  7.5-325 MG tablet Commonly known as: NORCO Take 1 tablet by mouth 4 (four) times daily.   insulin  aspart 100 UNIT/ML FlexPen Commonly known as: NOVOLOG  Inject 0-20  Units into the skin See admin instructions. Inject 0-20 units into the skin three times a  day before meals "as needed," per SLIDING SCALE   isosorbide  mononitrate 60 MG 24 hr tablet Commonly known as: IMDUR  Take 1 tablet (60 mg total) by mouth daily.   Lantus  100 UNIT/ML injection Generic drug: insulin  glargine Inject 50 Units into the skin every morning.   levothyroxine  200 MCG tablet Commonly known as: SYNTHROID  Take 100-200 mcg by mouth See admin instructions. Take 1/2 tablet by mouth every Monday and Friday, then take 1 tablet all other days   melatonin 5 MG Tabs Take 1 tablet (5 mg total) by mouth at bedtime as needed.   MIRCERA IJ Mircera   nitroGLYCERIN  0.4 MG SL tablet Commonly known as: NITROSTAT  Place 1 tablet (0.4 mg total) under the tongue every 5 (five) minutes as needed for chest pain.   predniSONE  20 MG tablet Commonly known as: DELTASONE  Take 1 tablet (20 mg total) by mouth daily with breakfast for 3 days. Start taking on: August 31, 2023   PRESCRIPTION MEDICATION CPAP- At bedtime   promethazine  25 MG suppository Commonly known as: PHENERGAN  SMARTSIG:0.5-1 SUPPOS Rectally Every 6 Hours PRN   sevelamer  carbonate 800 MG tablet Commonly known as: RENVELA  Take 1 tablet (800 mg total) by mouth 3 (three) times daily with meals.   torsemide  100 MG tablet Commonly known as: DEMADEX  Take 1 tablet (100 mg total) by mouth 2 (two) times daily. What changed:  when to take this additional instructions   True Metrix Blood Glucose Test test strip Generic drug: glucose blood   True Metrix Level 1 Low Soln   TRUEplus Lancets 28G Misc   vitamin D3 25 MCG tablet Commonly known as: CHOLECALCIFEROL  Take 1,000 Units by mouth daily with lunch.        Follow-up Information     [facility]. Go on 08/31/2023.   Why: Arrive at 10:00 am for 10:15 am start time. Contact information: [address] [place] [address] [phone]          Hamrick, Orest Bio, MD Follow up in 1 week(s).   Specialty: Family Medicine Contact information: [address] [place] [address] [phone]                  Time coordinating discharge: 39 minutes  Signed:  Milad Bublitz  [facility] 08/30/2023, 3:53 PM

## 2023-08-30 NOTE — Progress Notes (Signed)
 Asked to see patient. Pt states she is feeling much better from her "COPD" attack and is hoping to go home. She wears 3 L [place] O2 at home. Her CXR was clear and on exam she is euvolemic, she hasn't missed any HD. Have d/w pmd who says she is ready for d/c from his standpoint.  We have arranged for HD tomorrow at the outpatient unit at 10:15am, (arrive at 10:00 am) since she will have missed HD today.  Larry Poag  MD  [facility] 08/30/2023, 1:52 PM  Recent Labs  Lab 08/29/23 2000 08/29/23 2005 08/29/23 2013 08/30/23 0416  HGB 10.5*   < > 9.5* 9.7*  ALBUMIN 3.0*  --   --  2.5*  CALCIUM  8.8*  --   --  8.9  CREATININE 2.31*  --   --  2.57*  K 3.7   < > 3.4* 4.3   < > = values in this interval not displayed.

## 2023-08-30 NOTE — Progress Notes (Signed)
 Patient resting comfortably without any respiratory distress noted.  Bipap orders changed to PRN per protocol.  Will continue to monitor and assess for bipap needs.

## 2023-08-31 DIAGNOSIS — N186 End stage renal disease: Secondary | ICD-10-CM | POA: Diagnosis not present

## 2023-08-31 DIAGNOSIS — E1122 Type 2 diabetes mellitus with diabetic chronic kidney disease: Secondary | ICD-10-CM | POA: Diagnosis not present

## 2023-08-31 DIAGNOSIS — N2581 Secondary hyperparathyroidism of renal origin: Secondary | ICD-10-CM | POA: Diagnosis not present

## 2023-08-31 DIAGNOSIS — D631 Anemia in chronic kidney disease: Secondary | ICD-10-CM | POA: Diagnosis not present

## 2023-08-31 DIAGNOSIS — Z992 Dependence on renal dialysis: Secondary | ICD-10-CM | POA: Diagnosis not present

## 2023-09-01 DIAGNOSIS — Z992 Dependence on renal dialysis: Secondary | ICD-10-CM | POA: Diagnosis not present

## 2023-09-01 DIAGNOSIS — N186 End stage renal disease: Secondary | ICD-10-CM | POA: Diagnosis not present

## 2023-09-01 DIAGNOSIS — N179 Acute kidney failure, unspecified: Secondary | ICD-10-CM | POA: Diagnosis not present

## 2023-09-02 DIAGNOSIS — J441 Chronic obstructive pulmonary disease with (acute) exacerbation: Secondary | ICD-10-CM | POA: Diagnosis not present

## 2023-09-02 DIAGNOSIS — Z79899 Other long term (current) drug therapy: Secondary | ICD-10-CM | POA: Diagnosis not present

## 2023-09-03 DIAGNOSIS — D631 Anemia in chronic kidney disease: Secondary | ICD-10-CM | POA: Diagnosis not present

## 2023-09-03 DIAGNOSIS — N2581 Secondary hyperparathyroidism of renal origin: Secondary | ICD-10-CM | POA: Diagnosis not present

## 2023-09-03 DIAGNOSIS — N186 End stage renal disease: Secondary | ICD-10-CM | POA: Diagnosis not present

## 2023-09-03 DIAGNOSIS — Z992 Dependence on renal dialysis: Secondary | ICD-10-CM | POA: Diagnosis not present

## 2023-09-06 DIAGNOSIS — N2581 Secondary hyperparathyroidism of renal origin: Secondary | ICD-10-CM | POA: Diagnosis not present

## 2023-09-06 DIAGNOSIS — Z992 Dependence on renal dialysis: Secondary | ICD-10-CM | POA: Diagnosis not present

## 2023-09-06 DIAGNOSIS — N186 End stage renal disease: Secondary | ICD-10-CM | POA: Diagnosis not present

## 2023-09-06 DIAGNOSIS — D631 Anemia in chronic kidney disease: Secondary | ICD-10-CM | POA: Diagnosis not present

## 2023-09-10 DIAGNOSIS — Z992 Dependence on renal dialysis: Secondary | ICD-10-CM | POA: Diagnosis not present

## 2023-09-10 DIAGNOSIS — N2581 Secondary hyperparathyroidism of renal origin: Secondary | ICD-10-CM | POA: Diagnosis not present

## 2023-09-10 DIAGNOSIS — D631 Anemia in chronic kidney disease: Secondary | ICD-10-CM | POA: Diagnosis not present

## 2023-09-10 DIAGNOSIS — N186 End stage renal disease: Secondary | ICD-10-CM | POA: Diagnosis not present

## 2023-09-13 DIAGNOSIS — N2581 Secondary hyperparathyroidism of renal origin: Secondary | ICD-10-CM | POA: Diagnosis not present

## 2023-09-13 DIAGNOSIS — D631 Anemia in chronic kidney disease: Secondary | ICD-10-CM | POA: Diagnosis not present

## 2023-09-13 DIAGNOSIS — N186 End stage renal disease: Secondary | ICD-10-CM | POA: Diagnosis not present

## 2023-09-13 DIAGNOSIS — Z992 Dependence on renal dialysis: Secondary | ICD-10-CM | POA: Diagnosis not present

## 2023-09-17 DIAGNOSIS — N186 End stage renal disease: Secondary | ICD-10-CM | POA: Diagnosis not present

## 2023-09-17 DIAGNOSIS — N2581 Secondary hyperparathyroidism of renal origin: Secondary | ICD-10-CM | POA: Diagnosis not present

## 2023-09-17 DIAGNOSIS — D631 Anemia in chronic kidney disease: Secondary | ICD-10-CM | POA: Diagnosis not present

## 2023-09-17 DIAGNOSIS — Z992 Dependence on renal dialysis: Secondary | ICD-10-CM | POA: Diagnosis not present

## 2023-09-20 DIAGNOSIS — N186 End stage renal disease: Secondary | ICD-10-CM | POA: Diagnosis not present

## 2023-09-20 DIAGNOSIS — Z992 Dependence on renal dialysis: Secondary | ICD-10-CM | POA: Diagnosis not present

## 2023-09-20 DIAGNOSIS — N2581 Secondary hyperparathyroidism of renal origin: Secondary | ICD-10-CM | POA: Diagnosis not present

## 2023-09-20 DIAGNOSIS — D631 Anemia in chronic kidney disease: Secondary | ICD-10-CM | POA: Diagnosis not present

## 2023-09-21 DIAGNOSIS — E113213 Type 2 diabetes mellitus with mild nonproliferative diabetic retinopathy with macular edema, bilateral: Secondary | ICD-10-CM | POA: Diagnosis not present

## 2023-09-22 DIAGNOSIS — R040 Epistaxis: Secondary | ICD-10-CM | POA: Diagnosis not present

## 2023-09-22 DIAGNOSIS — S46811A Strain of other muscles, fascia and tendons at shoulder and upper arm level, right arm, initial encounter: Secondary | ICD-10-CM | POA: Diagnosis not present

## 2023-09-22 DIAGNOSIS — Z794 Long term (current) use of insulin: Secondary | ICD-10-CM | POA: Diagnosis not present

## 2023-09-22 DIAGNOSIS — E1140 Type 2 diabetes mellitus with diabetic neuropathy, unspecified: Secondary | ICD-10-CM | POA: Diagnosis not present

## 2023-09-24 DIAGNOSIS — D631 Anemia in chronic kidney disease: Secondary | ICD-10-CM | POA: Diagnosis not present

## 2023-09-24 DIAGNOSIS — N2581 Secondary hyperparathyroidism of renal origin: Secondary | ICD-10-CM | POA: Diagnosis not present

## 2023-09-24 DIAGNOSIS — Z992 Dependence on renal dialysis: Secondary | ICD-10-CM | POA: Diagnosis not present

## 2023-09-24 DIAGNOSIS — N186 End stage renal disease: Secondary | ICD-10-CM | POA: Diagnosis not present

## 2023-09-27 DIAGNOSIS — Z992 Dependence on renal dialysis: Secondary | ICD-10-CM | POA: Diagnosis not present

## 2023-09-27 DIAGNOSIS — D631 Anemia in chronic kidney disease: Secondary | ICD-10-CM | POA: Diagnosis not present

## 2023-09-27 DIAGNOSIS — N2581 Secondary hyperparathyroidism of renal origin: Secondary | ICD-10-CM | POA: Diagnosis not present

## 2023-09-27 DIAGNOSIS — N186 End stage renal disease: Secondary | ICD-10-CM | POA: Diagnosis not present

## 2023-10-01 ENCOUNTER — Telehealth: Payer: Self-pay | Admitting: Podiatry

## 2023-10-01 DIAGNOSIS — D631 Anemia in chronic kidney disease: Secondary | ICD-10-CM | POA: Diagnosis not present

## 2023-10-01 DIAGNOSIS — N2581 Secondary hyperparathyroidism of renal origin: Secondary | ICD-10-CM | POA: Diagnosis not present

## 2023-10-01 DIAGNOSIS — Z992 Dependence on renal dialysis: Secondary | ICD-10-CM | POA: Diagnosis not present

## 2023-10-01 DIAGNOSIS — N186 End stage renal disease: Secondary | ICD-10-CM | POA: Diagnosis not present

## 2023-10-01 NOTE — Telephone Encounter (Signed)
 Called to cancel DM shoe appt that is on 10/06/23 no VM box

## 2023-10-02 DIAGNOSIS — E1122 Type 2 diabetes mellitus with diabetic chronic kidney disease: Secondary | ICD-10-CM | POA: Diagnosis not present

## 2023-10-02 DIAGNOSIS — N186 End stage renal disease: Secondary | ICD-10-CM | POA: Diagnosis not present

## 2023-10-02 DIAGNOSIS — Z992 Dependence on renal dialysis: Secondary | ICD-10-CM | POA: Diagnosis not present

## 2023-10-04 DIAGNOSIS — N2581 Secondary hyperparathyroidism of renal origin: Secondary | ICD-10-CM | POA: Diagnosis not present

## 2023-10-04 DIAGNOSIS — Z23 Encounter for immunization: Secondary | ICD-10-CM | POA: Diagnosis not present

## 2023-10-04 DIAGNOSIS — D631 Anemia in chronic kidney disease: Secondary | ICD-10-CM | POA: Diagnosis not present

## 2023-10-04 DIAGNOSIS — N186 End stage renal disease: Secondary | ICD-10-CM | POA: Diagnosis not present

## 2023-10-04 DIAGNOSIS — Z992 Dependence on renal dialysis: Secondary | ICD-10-CM | POA: Diagnosis not present

## 2023-10-06 ENCOUNTER — Other Ambulatory Visit

## 2023-10-08 DIAGNOSIS — D631 Anemia in chronic kidney disease: Secondary | ICD-10-CM | POA: Diagnosis not present

## 2023-10-08 DIAGNOSIS — N186 End stage renal disease: Secondary | ICD-10-CM | POA: Diagnosis not present

## 2023-10-08 DIAGNOSIS — N2581 Secondary hyperparathyroidism of renal origin: Secondary | ICD-10-CM | POA: Diagnosis not present

## 2023-10-08 DIAGNOSIS — Z23 Encounter for immunization: Secondary | ICD-10-CM | POA: Diagnosis not present

## 2023-10-08 DIAGNOSIS — Z992 Dependence on renal dialysis: Secondary | ICD-10-CM | POA: Diagnosis not present

## 2023-10-11 DIAGNOSIS — N186 End stage renal disease: Secondary | ICD-10-CM | POA: Diagnosis not present

## 2023-10-11 DIAGNOSIS — Z23 Encounter for immunization: Secondary | ICD-10-CM | POA: Diagnosis not present

## 2023-10-11 DIAGNOSIS — Z992 Dependence on renal dialysis: Secondary | ICD-10-CM | POA: Diagnosis not present

## 2023-10-11 DIAGNOSIS — D631 Anemia in chronic kidney disease: Secondary | ICD-10-CM | POA: Diagnosis not present

## 2023-10-11 DIAGNOSIS — N2581 Secondary hyperparathyroidism of renal origin: Secondary | ICD-10-CM | POA: Diagnosis not present

## 2023-10-12 DIAGNOSIS — M353 Polymyalgia rheumatica: Secondary | ICD-10-CM | POA: Diagnosis not present

## 2023-10-12 DIAGNOSIS — Z79891 Long term (current) use of opiate analgesic: Secondary | ICD-10-CM | POA: Diagnosis not present

## 2023-10-12 DIAGNOSIS — G894 Chronic pain syndrome: Secondary | ICD-10-CM | POA: Diagnosis not present

## 2023-10-15 DIAGNOSIS — D631 Anemia in chronic kidney disease: Secondary | ICD-10-CM | POA: Diagnosis not present

## 2023-10-15 DIAGNOSIS — N2581 Secondary hyperparathyroidism of renal origin: Secondary | ICD-10-CM | POA: Diagnosis not present

## 2023-10-15 DIAGNOSIS — Z992 Dependence on renal dialysis: Secondary | ICD-10-CM | POA: Diagnosis not present

## 2023-10-15 DIAGNOSIS — N186 End stage renal disease: Secondary | ICD-10-CM | POA: Diagnosis not present

## 2023-10-15 DIAGNOSIS — Z23 Encounter for immunization: Secondary | ICD-10-CM | POA: Diagnosis not present

## 2023-10-18 DIAGNOSIS — Z992 Dependence on renal dialysis: Secondary | ICD-10-CM | POA: Diagnosis not present

## 2023-10-18 DIAGNOSIS — Z23 Encounter for immunization: Secondary | ICD-10-CM | POA: Diagnosis not present

## 2023-10-18 DIAGNOSIS — D631 Anemia in chronic kidney disease: Secondary | ICD-10-CM | POA: Diagnosis not present

## 2023-10-18 DIAGNOSIS — N186 End stage renal disease: Secondary | ICD-10-CM | POA: Diagnosis not present

## 2023-10-18 DIAGNOSIS — N2581 Secondary hyperparathyroidism of renal origin: Secondary | ICD-10-CM | POA: Diagnosis not present

## 2023-10-22 DIAGNOSIS — Z23 Encounter for immunization: Secondary | ICD-10-CM | POA: Diagnosis not present

## 2023-10-22 DIAGNOSIS — N186 End stage renal disease: Secondary | ICD-10-CM | POA: Diagnosis not present

## 2023-10-22 DIAGNOSIS — N2581 Secondary hyperparathyroidism of renal origin: Secondary | ICD-10-CM | POA: Diagnosis not present

## 2023-10-22 DIAGNOSIS — D631 Anemia in chronic kidney disease: Secondary | ICD-10-CM | POA: Diagnosis not present

## 2023-10-22 DIAGNOSIS — Z992 Dependence on renal dialysis: Secondary | ICD-10-CM | POA: Diagnosis not present

## 2023-10-25 DIAGNOSIS — Z992 Dependence on renal dialysis: Secondary | ICD-10-CM | POA: Diagnosis not present

## 2023-10-25 DIAGNOSIS — Z23 Encounter for immunization: Secondary | ICD-10-CM | POA: Diagnosis not present

## 2023-10-25 DIAGNOSIS — D631 Anemia in chronic kidney disease: Secondary | ICD-10-CM | POA: Diagnosis not present

## 2023-10-25 DIAGNOSIS — N186 End stage renal disease: Secondary | ICD-10-CM | POA: Diagnosis not present

## 2023-10-25 DIAGNOSIS — N2581 Secondary hyperparathyroidism of renal origin: Secondary | ICD-10-CM | POA: Diagnosis not present

## 2023-10-29 DIAGNOSIS — Z992 Dependence on renal dialysis: Secondary | ICD-10-CM | POA: Diagnosis not present

## 2023-10-29 DIAGNOSIS — D631 Anemia in chronic kidney disease: Secondary | ICD-10-CM | POA: Diagnosis not present

## 2023-10-29 DIAGNOSIS — N2581 Secondary hyperparathyroidism of renal origin: Secondary | ICD-10-CM | POA: Diagnosis not present

## 2023-10-29 DIAGNOSIS — Z23 Encounter for immunization: Secondary | ICD-10-CM | POA: Diagnosis not present

## 2023-10-29 DIAGNOSIS — N186 End stage renal disease: Secondary | ICD-10-CM | POA: Diagnosis not present

## 2023-11-01 DIAGNOSIS — Z992 Dependence on renal dialysis: Secondary | ICD-10-CM | POA: Diagnosis not present

## 2023-11-01 DIAGNOSIS — N186 End stage renal disease: Secondary | ICD-10-CM | POA: Diagnosis not present

## 2023-11-01 DIAGNOSIS — D631 Anemia in chronic kidney disease: Secondary | ICD-10-CM | POA: Diagnosis not present

## 2023-11-01 DIAGNOSIS — N2581 Secondary hyperparathyroidism of renal origin: Secondary | ICD-10-CM | POA: Diagnosis not present

## 2023-11-01 DIAGNOSIS — Z23 Encounter for immunization: Secondary | ICD-10-CM | POA: Diagnosis not present

## 2023-11-01 DIAGNOSIS — E1122 Type 2 diabetes mellitus with diabetic chronic kidney disease: Secondary | ICD-10-CM | POA: Diagnosis not present

## 2023-11-05 DIAGNOSIS — D631 Anemia in chronic kidney disease: Secondary | ICD-10-CM | POA: Diagnosis not present

## 2023-11-05 DIAGNOSIS — E1122 Type 2 diabetes mellitus with diabetic chronic kidney disease: Secondary | ICD-10-CM | POA: Diagnosis not present

## 2023-11-05 DIAGNOSIS — Z992 Dependence on renal dialysis: Secondary | ICD-10-CM | POA: Diagnosis not present

## 2023-11-05 DIAGNOSIS — N186 End stage renal disease: Secondary | ICD-10-CM | POA: Diagnosis not present

## 2023-11-05 DIAGNOSIS — Z23 Encounter for immunization: Secondary | ICD-10-CM | POA: Diagnosis not present

## 2023-11-05 DIAGNOSIS — N2581 Secondary hyperparathyroidism of renal origin: Secondary | ICD-10-CM | POA: Diagnosis not present

## 2023-11-08 DIAGNOSIS — N186 End stage renal disease: Secondary | ICD-10-CM | POA: Diagnosis not present

## 2023-11-08 DIAGNOSIS — Z23 Encounter for immunization: Secondary | ICD-10-CM | POA: Diagnosis not present

## 2023-11-08 DIAGNOSIS — D631 Anemia in chronic kidney disease: Secondary | ICD-10-CM | POA: Diagnosis not present

## 2023-11-08 DIAGNOSIS — Z992 Dependence on renal dialysis: Secondary | ICD-10-CM | POA: Diagnosis not present

## 2023-11-08 DIAGNOSIS — N2581 Secondary hyperparathyroidism of renal origin: Secondary | ICD-10-CM | POA: Diagnosis not present

## 2023-11-08 DIAGNOSIS — E1122 Type 2 diabetes mellitus with diabetic chronic kidney disease: Secondary | ICD-10-CM | POA: Diagnosis not present

## 2023-11-10 ENCOUNTER — Ambulatory Visit: Admitting: Podiatry

## 2023-11-10 ENCOUNTER — Encounter: Payer: Self-pay | Admitting: Podiatry

## 2023-11-10 DIAGNOSIS — Z89422 Acquired absence of other left toe(s): Secondary | ICD-10-CM | POA: Diagnosis not present

## 2023-11-10 DIAGNOSIS — E1142 Type 2 diabetes mellitus with diabetic polyneuropathy: Secondary | ICD-10-CM | POA: Diagnosis not present

## 2023-11-10 DIAGNOSIS — M79676 Pain in unspecified toe(s): Secondary | ICD-10-CM

## 2023-11-10 DIAGNOSIS — B351 Tinea unguium: Secondary | ICD-10-CM | POA: Diagnosis not present

## 2023-11-10 NOTE — Progress Notes (Signed)
 This patient returns to my office for at risk foot care.  This patient requires this care by a professional since this patient will be at risk due to having ESRD, Diabetes and coagulation defect due to eliquis .  This patient is unable to cut nails herself since the patient cannot reach her nails.These nails are painful walking and wearing shoes.  She presents to the office with her husband. This patient presents for at risk foot care today.  General Appearance  Alert, conversant and in no acute stress.  Vascular  Dorsalis pedis and posterior tibial  pulses are palpable  bilaterally.  Capillary return is within normal limits  bilaterally. Temperature is within normal limits  bilaterally.  Neurologic  Senn-Weinstein monofilament wire test within normal limits  bilaterally. Muscle power within normal limits bilaterally.  Nails Thick disfigured discolored nails with subungual debris  from hallux to fifth toes bilaterally. No evidence of bacterial infection or drainage bilaterally.  Orthopedic  No limitations of motion  feet .  No crepitus or effusions noted.  No bony pathology or digital deformities noted. Amputation 1,4 left foot.  Skin  normotropic skin with no porokeratosis noted bilaterally.  No signs of infections or ulcers noted.     Onychomycosis  Pain in right toes  Pain in left toes  Consent was obtained for treatment procedures.   Mechanical debridement of nails 1-5  bilaterally performed with a nail nipper.  Filed with dremel without incident.  After debriding her nails she had draining lesion second toe at site of nail bed.  Neosporin/DSD applied.  Told her to peroxide at home.   Return office visit    3 months                  Told patient to return for periodic foot care and evaluation due to potential at risk complications.   Cordella Bold DPM

## 2023-11-12 DIAGNOSIS — Z992 Dependence on renal dialysis: Secondary | ICD-10-CM | POA: Diagnosis not present

## 2023-11-12 DIAGNOSIS — N2581 Secondary hyperparathyroidism of renal origin: Secondary | ICD-10-CM | POA: Diagnosis not present

## 2023-11-12 DIAGNOSIS — D631 Anemia in chronic kidney disease: Secondary | ICD-10-CM | POA: Diagnosis not present

## 2023-11-12 DIAGNOSIS — N186 End stage renal disease: Secondary | ICD-10-CM | POA: Diagnosis not present

## 2023-11-12 DIAGNOSIS — E1122 Type 2 diabetes mellitus with diabetic chronic kidney disease: Secondary | ICD-10-CM | POA: Diagnosis not present

## 2023-11-12 DIAGNOSIS — Z23 Encounter for immunization: Secondary | ICD-10-CM | POA: Diagnosis not present

## 2023-11-15 DIAGNOSIS — D631 Anemia in chronic kidney disease: Secondary | ICD-10-CM | POA: Diagnosis not present

## 2023-11-15 DIAGNOSIS — Z23 Encounter for immunization: Secondary | ICD-10-CM | POA: Diagnosis not present

## 2023-11-15 DIAGNOSIS — E1122 Type 2 diabetes mellitus with diabetic chronic kidney disease: Secondary | ICD-10-CM | POA: Diagnosis not present

## 2023-11-15 DIAGNOSIS — N2581 Secondary hyperparathyroidism of renal origin: Secondary | ICD-10-CM | POA: Diagnosis not present

## 2023-11-15 DIAGNOSIS — N186 End stage renal disease: Secondary | ICD-10-CM | POA: Diagnosis not present

## 2023-11-15 DIAGNOSIS — Z992 Dependence on renal dialysis: Secondary | ICD-10-CM | POA: Diagnosis not present

## 2023-11-16 DIAGNOSIS — M353 Polymyalgia rheumatica: Secondary | ICD-10-CM | POA: Diagnosis not present

## 2023-11-16 DIAGNOSIS — G894 Chronic pain syndrome: Secondary | ICD-10-CM | POA: Diagnosis not present

## 2023-11-16 DIAGNOSIS — Z79891 Long term (current) use of opiate analgesic: Secondary | ICD-10-CM | POA: Diagnosis not present

## 2023-11-19 DIAGNOSIS — N2581 Secondary hyperparathyroidism of renal origin: Secondary | ICD-10-CM | POA: Diagnosis not present

## 2023-11-19 DIAGNOSIS — N186 End stage renal disease: Secondary | ICD-10-CM | POA: Diagnosis not present

## 2023-11-19 DIAGNOSIS — Z992 Dependence on renal dialysis: Secondary | ICD-10-CM | POA: Diagnosis not present

## 2023-11-19 DIAGNOSIS — D631 Anemia in chronic kidney disease: Secondary | ICD-10-CM | POA: Diagnosis not present

## 2023-11-19 DIAGNOSIS — Z23 Encounter for immunization: Secondary | ICD-10-CM | POA: Diagnosis not present

## 2023-11-19 DIAGNOSIS — E1122 Type 2 diabetes mellitus with diabetic chronic kidney disease: Secondary | ICD-10-CM | POA: Diagnosis not present

## 2023-11-22 DIAGNOSIS — Z992 Dependence on renal dialysis: Secondary | ICD-10-CM | POA: Diagnosis not present

## 2023-11-22 DIAGNOSIS — E1122 Type 2 diabetes mellitus with diabetic chronic kidney disease: Secondary | ICD-10-CM | POA: Diagnosis not present

## 2023-11-22 DIAGNOSIS — D631 Anemia in chronic kidney disease: Secondary | ICD-10-CM | POA: Diagnosis not present

## 2023-11-22 DIAGNOSIS — N2581 Secondary hyperparathyroidism of renal origin: Secondary | ICD-10-CM | POA: Diagnosis not present

## 2023-11-22 DIAGNOSIS — Z23 Encounter for immunization: Secondary | ICD-10-CM | POA: Diagnosis not present

## 2023-11-22 DIAGNOSIS — N186 End stage renal disease: Secondary | ICD-10-CM | POA: Diagnosis not present

## 2023-11-25 DIAGNOSIS — Z992 Dependence on renal dialysis: Secondary | ICD-10-CM | POA: Diagnosis not present

## 2023-11-25 DIAGNOSIS — I495 Sick sinus syndrome: Secondary | ICD-10-CM | POA: Diagnosis not present

## 2023-11-25 DIAGNOSIS — E1140 Type 2 diabetes mellitus with diabetic neuropathy, unspecified: Secondary | ICD-10-CM | POA: Diagnosis not present

## 2023-11-25 DIAGNOSIS — N186 End stage renal disease: Secondary | ICD-10-CM | POA: Diagnosis not present

## 2023-11-25 DIAGNOSIS — N2581 Secondary hyperparathyroidism of renal origin: Secondary | ICD-10-CM | POA: Diagnosis not present

## 2023-11-25 DIAGNOSIS — Z794 Long term (current) use of insulin: Secondary | ICD-10-CM | POA: Diagnosis not present

## 2023-11-25 DIAGNOSIS — Z9181 History of falling: Secondary | ICD-10-CM | POA: Diagnosis not present

## 2023-11-25 DIAGNOSIS — E890 Postprocedural hypothyroidism: Secondary | ICD-10-CM | POA: Diagnosis not present

## 2023-11-25 DIAGNOSIS — E790 Hyperuricemia without signs of inflammatory arthritis and tophaceous disease: Secondary | ICD-10-CM | POA: Diagnosis not present

## 2023-11-25 DIAGNOSIS — I5030 Unspecified diastolic (congestive) heart failure: Secondary | ICD-10-CM | POA: Diagnosis not present

## 2023-11-26 DIAGNOSIS — N186 End stage renal disease: Secondary | ICD-10-CM | POA: Diagnosis not present

## 2023-11-26 DIAGNOSIS — D631 Anemia in chronic kidney disease: Secondary | ICD-10-CM | POA: Diagnosis not present

## 2023-11-26 DIAGNOSIS — E1122 Type 2 diabetes mellitus with diabetic chronic kidney disease: Secondary | ICD-10-CM | POA: Diagnosis not present

## 2023-11-26 DIAGNOSIS — Z23 Encounter for immunization: Secondary | ICD-10-CM | POA: Diagnosis not present

## 2023-11-26 DIAGNOSIS — N2581 Secondary hyperparathyroidism of renal origin: Secondary | ICD-10-CM | POA: Diagnosis not present

## 2023-11-26 DIAGNOSIS — Z992 Dependence on renal dialysis: Secondary | ICD-10-CM | POA: Diagnosis not present

## 2023-11-29 DIAGNOSIS — D631 Anemia in chronic kidney disease: Secondary | ICD-10-CM | POA: Diagnosis not present

## 2023-11-29 DIAGNOSIS — E1122 Type 2 diabetes mellitus with diabetic chronic kidney disease: Secondary | ICD-10-CM | POA: Diagnosis not present

## 2023-11-29 DIAGNOSIS — N186 End stage renal disease: Secondary | ICD-10-CM | POA: Diagnosis not present

## 2023-11-29 DIAGNOSIS — Z992 Dependence on renal dialysis: Secondary | ICD-10-CM | POA: Diagnosis not present

## 2023-11-29 DIAGNOSIS — N2581 Secondary hyperparathyroidism of renal origin: Secondary | ICD-10-CM | POA: Diagnosis not present

## 2023-11-29 DIAGNOSIS — Z23 Encounter for immunization: Secondary | ICD-10-CM | POA: Diagnosis not present

## 2023-12-02 DIAGNOSIS — N186 End stage renal disease: Secondary | ICD-10-CM | POA: Diagnosis not present

## 2023-12-02 DIAGNOSIS — Z992 Dependence on renal dialysis: Secondary | ICD-10-CM | POA: Diagnosis not present

## 2023-12-02 DIAGNOSIS — E1122 Type 2 diabetes mellitus with diabetic chronic kidney disease: Secondary | ICD-10-CM | POA: Diagnosis not present

## 2023-12-03 DIAGNOSIS — N2581 Secondary hyperparathyroidism of renal origin: Secondary | ICD-10-CM | POA: Diagnosis not present

## 2023-12-03 DIAGNOSIS — D631 Anemia in chronic kidney disease: Secondary | ICD-10-CM | POA: Diagnosis not present

## 2023-12-03 DIAGNOSIS — Z992 Dependence on renal dialysis: Secondary | ICD-10-CM | POA: Diagnosis not present

## 2023-12-03 DIAGNOSIS — N186 End stage renal disease: Secondary | ICD-10-CM | POA: Diagnosis not present

## 2023-12-06 DIAGNOSIS — Z992 Dependence on renal dialysis: Secondary | ICD-10-CM | POA: Diagnosis not present

## 2023-12-06 DIAGNOSIS — N186 End stage renal disease: Secondary | ICD-10-CM | POA: Diagnosis not present

## 2023-12-06 DIAGNOSIS — D631 Anemia in chronic kidney disease: Secondary | ICD-10-CM | POA: Diagnosis not present

## 2023-12-06 DIAGNOSIS — N2581 Secondary hyperparathyroidism of renal origin: Secondary | ICD-10-CM | POA: Diagnosis not present

## 2023-12-07 DIAGNOSIS — H43813 Vitreous degeneration, bilateral: Secondary | ICD-10-CM | POA: Diagnosis not present

## 2023-12-07 DIAGNOSIS — E113213 Type 2 diabetes mellitus with mild nonproliferative diabetic retinopathy with macular edema, bilateral: Secondary | ICD-10-CM | POA: Diagnosis not present

## 2023-12-07 DIAGNOSIS — H353132 Nonexudative age-related macular degeneration, bilateral, intermediate dry stage: Secondary | ICD-10-CM | POA: Diagnosis not present

## 2023-12-07 DIAGNOSIS — Z961 Presence of intraocular lens: Secondary | ICD-10-CM | POA: Diagnosis not present

## 2023-12-07 DIAGNOSIS — H35033 Hypertensive retinopathy, bilateral: Secondary | ICD-10-CM | POA: Diagnosis not present

## 2023-12-10 DIAGNOSIS — N2581 Secondary hyperparathyroidism of renal origin: Secondary | ICD-10-CM | POA: Diagnosis not present

## 2023-12-10 DIAGNOSIS — N186 End stage renal disease: Secondary | ICD-10-CM | POA: Diagnosis not present

## 2023-12-10 DIAGNOSIS — Z992 Dependence on renal dialysis: Secondary | ICD-10-CM | POA: Diagnosis not present

## 2023-12-10 DIAGNOSIS — D631 Anemia in chronic kidney disease: Secondary | ICD-10-CM | POA: Diagnosis not present

## 2023-12-13 DIAGNOSIS — N186 End stage renal disease: Secondary | ICD-10-CM | POA: Diagnosis not present

## 2023-12-13 DIAGNOSIS — N2581 Secondary hyperparathyroidism of renal origin: Secondary | ICD-10-CM | POA: Diagnosis not present

## 2023-12-13 DIAGNOSIS — Z992 Dependence on renal dialysis: Secondary | ICD-10-CM | POA: Diagnosis not present

## 2023-12-13 DIAGNOSIS — D631 Anemia in chronic kidney disease: Secondary | ICD-10-CM | POA: Diagnosis not present

## 2023-12-17 DIAGNOSIS — D631 Anemia in chronic kidney disease: Secondary | ICD-10-CM | POA: Diagnosis not present

## 2023-12-17 DIAGNOSIS — Z992 Dependence on renal dialysis: Secondary | ICD-10-CM | POA: Diagnosis not present

## 2023-12-17 DIAGNOSIS — N186 End stage renal disease: Secondary | ICD-10-CM | POA: Diagnosis not present

## 2023-12-17 DIAGNOSIS — N2581 Secondary hyperparathyroidism of renal origin: Secondary | ICD-10-CM | POA: Diagnosis not present

## 2023-12-17 DIAGNOSIS — M353 Polymyalgia rheumatica: Secondary | ICD-10-CM | POA: Diagnosis not present

## 2023-12-20 DIAGNOSIS — D631 Anemia in chronic kidney disease: Secondary | ICD-10-CM | POA: Diagnosis not present

## 2023-12-20 DIAGNOSIS — N186 End stage renal disease: Secondary | ICD-10-CM | POA: Diagnosis not present

## 2023-12-20 DIAGNOSIS — Z992 Dependence on renal dialysis: Secondary | ICD-10-CM | POA: Diagnosis not present

## 2023-12-20 DIAGNOSIS — N2581 Secondary hyperparathyroidism of renal origin: Secondary | ICD-10-CM | POA: Diagnosis not present

## 2023-12-24 DIAGNOSIS — Z992 Dependence on renal dialysis: Secondary | ICD-10-CM | POA: Diagnosis not present

## 2023-12-24 DIAGNOSIS — N2581 Secondary hyperparathyroidism of renal origin: Secondary | ICD-10-CM | POA: Diagnosis not present

## 2023-12-24 DIAGNOSIS — D631 Anemia in chronic kidney disease: Secondary | ICD-10-CM | POA: Diagnosis not present

## 2023-12-24 DIAGNOSIS — N186 End stage renal disease: Secondary | ICD-10-CM | POA: Diagnosis not present

## 2023-12-28 DIAGNOSIS — Z992 Dependence on renal dialysis: Secondary | ICD-10-CM | POA: Diagnosis not present

## 2023-12-28 DIAGNOSIS — D631 Anemia in chronic kidney disease: Secondary | ICD-10-CM | POA: Diagnosis not present

## 2023-12-28 DIAGNOSIS — N186 End stage renal disease: Secondary | ICD-10-CM | POA: Diagnosis not present

## 2023-12-28 DIAGNOSIS — N2581 Secondary hyperparathyroidism of renal origin: Secondary | ICD-10-CM | POA: Diagnosis not present

## 2023-12-30 ENCOUNTER — Other Ambulatory Visit: Payer: Self-pay | Admitting: Adult Health

## 2023-12-30 DIAGNOSIS — I10 Essential (primary) hypertension: Secondary | ICD-10-CM | POA: Diagnosis not present

## 2023-12-30 DIAGNOSIS — R519 Headache, unspecified: Secondary | ICD-10-CM

## 2023-12-31 ENCOUNTER — Ambulatory Visit
Admission: RE | Admit: 2023-12-31 | Discharge: 2023-12-31 | Disposition: A | Discharge status: Home / Self Care | Source: Ambulatory Visit | Attending: Adult Health | Admitting: Adult Health

## 2023-12-31 DIAGNOSIS — R519 Headache, unspecified: Secondary | ICD-10-CM

## 2023-12-31 DIAGNOSIS — D631 Anemia in chronic kidney disease: Secondary | ICD-10-CM | POA: Diagnosis not present

## 2023-12-31 DIAGNOSIS — S0990XA Unspecified injury of head, initial encounter: Secondary | ICD-10-CM | POA: Diagnosis not present

## 2023-12-31 DIAGNOSIS — Z992 Dependence on renal dialysis: Secondary | ICD-10-CM | POA: Diagnosis not present

## 2023-12-31 DIAGNOSIS — N2581 Secondary hyperparathyroidism of renal origin: Secondary | ICD-10-CM | POA: Diagnosis not present

## 2023-12-31 DIAGNOSIS — N186 End stage renal disease: Secondary | ICD-10-CM | POA: Diagnosis not present

## 2024-01-02 DIAGNOSIS — N186 End stage renal disease: Secondary | ICD-10-CM | POA: Diagnosis not present

## 2024-01-02 DIAGNOSIS — E1122 Type 2 diabetes mellitus with diabetic chronic kidney disease: Secondary | ICD-10-CM | POA: Diagnosis not present

## 2024-01-02 DIAGNOSIS — Z992 Dependence on renal dialysis: Secondary | ICD-10-CM | POA: Diagnosis not present

## 2024-01-03 DIAGNOSIS — Z992 Dependence on renal dialysis: Secondary | ICD-10-CM | POA: Diagnosis not present

## 2024-01-03 DIAGNOSIS — T8249XD Other complication of vascular dialysis catheter, subsequent encounter: Secondary | ICD-10-CM | POA: Diagnosis not present

## 2024-01-03 DIAGNOSIS — N186 End stage renal disease: Secondary | ICD-10-CM | POA: Diagnosis not present

## 2024-01-03 DIAGNOSIS — N2581 Secondary hyperparathyroidism of renal origin: Secondary | ICD-10-CM | POA: Diagnosis not present

## 2024-01-03 DIAGNOSIS — D631 Anemia in chronic kidney disease: Secondary | ICD-10-CM | POA: Diagnosis not present

## 2024-01-07 DIAGNOSIS — D631 Anemia in chronic kidney disease: Secondary | ICD-10-CM | POA: Diagnosis not present

## 2024-01-07 DIAGNOSIS — N186 End stage renal disease: Secondary | ICD-10-CM | POA: Diagnosis not present

## 2024-01-07 DIAGNOSIS — Z992 Dependence on renal dialysis: Secondary | ICD-10-CM | POA: Diagnosis not present

## 2024-01-07 DIAGNOSIS — T8249XD Other complication of vascular dialysis catheter, subsequent encounter: Secondary | ICD-10-CM | POA: Diagnosis not present

## 2024-01-07 DIAGNOSIS — N2581 Secondary hyperparathyroidism of renal origin: Secondary | ICD-10-CM | POA: Diagnosis not present

## 2024-01-10 DIAGNOSIS — D631 Anemia in chronic kidney disease: Secondary | ICD-10-CM | POA: Diagnosis not present

## 2024-01-10 DIAGNOSIS — N2581 Secondary hyperparathyroidism of renal origin: Secondary | ICD-10-CM | POA: Diagnosis not present

## 2024-01-10 DIAGNOSIS — T8249XD Other complication of vascular dialysis catheter, subsequent encounter: Secondary | ICD-10-CM | POA: Diagnosis not present

## 2024-01-10 DIAGNOSIS — N186 End stage renal disease: Secondary | ICD-10-CM | POA: Diagnosis not present

## 2024-01-10 DIAGNOSIS — Z992 Dependence on renal dialysis: Secondary | ICD-10-CM | POA: Diagnosis not present

## 2024-01-13 DIAGNOSIS — R519 Headache, unspecified: Secondary | ICD-10-CM | POA: Diagnosis not present

## 2024-01-13 DIAGNOSIS — I10 Essential (primary) hypertension: Secondary | ICD-10-CM | POA: Diagnosis not present

## 2024-01-13 DIAGNOSIS — R930 Abnormal findings on diagnostic imaging of skull and head, not elsewhere classified: Secondary | ICD-10-CM | POA: Diagnosis not present

## 2024-01-14 DIAGNOSIS — N186 End stage renal disease: Secondary | ICD-10-CM | POA: Diagnosis not present

## 2024-01-14 DIAGNOSIS — D631 Anemia in chronic kidney disease: Secondary | ICD-10-CM | POA: Diagnosis not present

## 2024-01-14 DIAGNOSIS — N2581 Secondary hyperparathyroidism of renal origin: Secondary | ICD-10-CM | POA: Diagnosis not present

## 2024-01-14 DIAGNOSIS — Z992 Dependence on renal dialysis: Secondary | ICD-10-CM | POA: Diagnosis not present

## 2024-01-14 DIAGNOSIS — T8249XD Other complication of vascular dialysis catheter, subsequent encounter: Secondary | ICD-10-CM | POA: Diagnosis not present

## 2024-01-17 DIAGNOSIS — N186 End stage renal disease: Secondary | ICD-10-CM | POA: Diagnosis not present

## 2024-01-17 DIAGNOSIS — D631 Anemia in chronic kidney disease: Secondary | ICD-10-CM | POA: Diagnosis not present

## 2024-01-17 DIAGNOSIS — Z992 Dependence on renal dialysis: Secondary | ICD-10-CM | POA: Diagnosis not present

## 2024-01-17 DIAGNOSIS — N2581 Secondary hyperparathyroidism of renal origin: Secondary | ICD-10-CM | POA: Diagnosis not present

## 2024-01-17 DIAGNOSIS — T8249XD Other complication of vascular dialysis catheter, subsequent encounter: Secondary | ICD-10-CM | POA: Diagnosis not present

## 2024-01-18 ENCOUNTER — Encounter (HOSPITAL_COMMUNITY): Payer: Self-pay

## 2024-01-18 ENCOUNTER — Inpatient Hospital Stay (HOSPITAL_COMMUNITY)

## 2024-01-18 ENCOUNTER — Emergency Department (HOSPITAL_COMMUNITY)

## 2024-01-18 ENCOUNTER — Inpatient Hospital Stay (HOSPITAL_COMMUNITY)
Admission: EM | Admit: 2024-01-18 | Discharge: 2024-01-20 | DRG: 190 | Disposition: A | Discharge status: Home Health | Attending: Internal Medicine | Admitting: Internal Medicine

## 2024-01-18 ENCOUNTER — Other Ambulatory Visit: Payer: Self-pay

## 2024-01-18 DIAGNOSIS — N1832 Chronic kidney disease, stage 3b: Secondary | ICD-10-CM | POA: Diagnosis not present

## 2024-01-18 DIAGNOSIS — N186 End stage renal disease: Secondary | ICD-10-CM | POA: Diagnosis present

## 2024-01-18 DIAGNOSIS — E66811 Obesity, class 1: Secondary | ICD-10-CM | POA: Diagnosis present

## 2024-01-18 DIAGNOSIS — I272 Pulmonary hypertension, unspecified: Secondary | ICD-10-CM | POA: Diagnosis not present

## 2024-01-18 DIAGNOSIS — E1122 Type 2 diabetes mellitus with diabetic chronic kidney disease: Secondary | ICD-10-CM | POA: Diagnosis not present

## 2024-01-18 DIAGNOSIS — I132 Hypertensive heart and chronic kidney disease with heart failure and with stage 5 chronic kidney disease, or end stage renal disease: Secondary | ICD-10-CM | POA: Diagnosis not present

## 2024-01-18 DIAGNOSIS — Z9049 Acquired absence of other specified parts of digestive tract: Secondary | ICD-10-CM

## 2024-01-18 DIAGNOSIS — E890 Postprocedural hypothyroidism: Secondary | ICD-10-CM | POA: Diagnosis present

## 2024-01-18 DIAGNOSIS — Z8249 Family history of ischemic heart disease and other diseases of the circulatory system: Secondary | ICD-10-CM | POA: Diagnosis not present

## 2024-01-18 DIAGNOSIS — I5032 Chronic diastolic (congestive) heart failure: Secondary | ICD-10-CM | POA: Diagnosis present

## 2024-01-18 DIAGNOSIS — R059 Cough, unspecified: Secondary | ICD-10-CM | POA: Diagnosis not present

## 2024-01-18 DIAGNOSIS — J9621 Acute and chronic respiratory failure with hypoxia: Secondary | ICD-10-CM | POA: Diagnosis present

## 2024-01-18 DIAGNOSIS — I2510 Atherosclerotic heart disease of native coronary artery without angina pectoris: Secondary | ICD-10-CM | POA: Diagnosis not present

## 2024-01-18 DIAGNOSIS — Z9842 Cataract extraction status, left eye: Secondary | ICD-10-CM

## 2024-01-18 DIAGNOSIS — R7989 Other specified abnormal findings of blood chemistry: Secondary | ICD-10-CM | POA: Diagnosis present

## 2024-01-18 DIAGNOSIS — Z794 Long term (current) use of insulin: Secondary | ICD-10-CM | POA: Diagnosis not present

## 2024-01-18 DIAGNOSIS — E66812 Obesity, class 2: Secondary | ICD-10-CM

## 2024-01-18 DIAGNOSIS — Z6832 Body mass index (BMI) 32.0-32.9, adult: Secondary | ICD-10-CM

## 2024-01-18 DIAGNOSIS — Z8614 Personal history of Methicillin resistant Staphylococcus aureus infection: Secondary | ICD-10-CM

## 2024-01-18 DIAGNOSIS — R0602 Shortness of breath: Secondary | ICD-10-CM | POA: Diagnosis not present

## 2024-01-18 DIAGNOSIS — J439 Emphysema, unspecified: Secondary | ICD-10-CM | POA: Diagnosis not present

## 2024-01-18 DIAGNOSIS — R0609 Other forms of dyspnea: Secondary | ICD-10-CM | POA: Diagnosis not present

## 2024-01-18 DIAGNOSIS — Z66 Do not resuscitate: Secondary | ICD-10-CM | POA: Diagnosis not present

## 2024-01-18 DIAGNOSIS — Z6838 Body mass index (BMI) 38.0-38.9, adult: Secondary | ICD-10-CM

## 2024-01-18 DIAGNOSIS — Z992 Dependence on renal dialysis: Secondary | ICD-10-CM | POA: Diagnosis not present

## 2024-01-18 DIAGNOSIS — E1169 Type 2 diabetes mellitus with other specified complication: Secondary | ICD-10-CM | POA: Diagnosis present

## 2024-01-18 DIAGNOSIS — E669 Obesity, unspecified: Secondary | ICD-10-CM | POA: Diagnosis present

## 2024-01-18 DIAGNOSIS — Z801 Family history of malignant neoplasm of trachea, bronchus and lung: Secondary | ICD-10-CM

## 2024-01-18 DIAGNOSIS — Z79890 Hormone replacement therapy (postmenopausal): Secondary | ICD-10-CM

## 2024-01-18 DIAGNOSIS — D631 Anemia in chronic kidney disease: Secondary | ICD-10-CM | POA: Diagnosis present

## 2024-01-18 DIAGNOSIS — Z833 Family history of diabetes mellitus: Secondary | ICD-10-CM | POA: Diagnosis not present

## 2024-01-18 DIAGNOSIS — R001 Bradycardia, unspecified: Secondary | ICD-10-CM | POA: Diagnosis present

## 2024-01-18 DIAGNOSIS — G894 Chronic pain syndrome: Secondary | ICD-10-CM | POA: Diagnosis not present

## 2024-01-18 DIAGNOSIS — E785 Hyperlipidemia, unspecified: Secondary | ICD-10-CM | POA: Diagnosis present

## 2024-01-18 DIAGNOSIS — Z950 Presence of cardiac pacemaker: Secondary | ICD-10-CM

## 2024-01-18 DIAGNOSIS — Z79899 Other long term (current) drug therapy: Secondary | ICD-10-CM | POA: Diagnosis not present

## 2024-01-18 DIAGNOSIS — I10 Essential (primary) hypertension: Secondary | ICD-10-CM | POA: Diagnosis not present

## 2024-01-18 DIAGNOSIS — R918 Other nonspecific abnormal finding of lung field: Secondary | ICD-10-CM | POA: Diagnosis not present

## 2024-01-18 DIAGNOSIS — Z9841 Cataract extraction status, right eye: Secondary | ICD-10-CM

## 2024-01-18 DIAGNOSIS — J441 Chronic obstructive pulmonary disease with (acute) exacerbation: Secondary | ICD-10-CM | POA: Diagnosis not present

## 2024-01-18 DIAGNOSIS — Z961 Presence of intraocular lens: Secondary | ICD-10-CM | POA: Diagnosis present

## 2024-01-18 DIAGNOSIS — Z808 Family history of malignant neoplasm of other organs or systems: Secondary | ICD-10-CM

## 2024-01-18 DIAGNOSIS — Z90710 Acquired absence of both cervix and uterus: Secondary | ICD-10-CM

## 2024-01-18 DIAGNOSIS — R069 Unspecified abnormalities of breathing: Secondary | ICD-10-CM | POA: Diagnosis not present

## 2024-01-18 DIAGNOSIS — I082 Rheumatic disorders of both aortic and tricuspid valves: Secondary | ICD-10-CM | POA: Diagnosis not present

## 2024-01-18 DIAGNOSIS — I480 Paroxysmal atrial fibrillation: Secondary | ICD-10-CM | POA: Diagnosis not present

## 2024-01-18 DIAGNOSIS — Z981 Arthrodesis status: Secondary | ICD-10-CM

## 2024-01-18 DIAGNOSIS — D638 Anemia in other chronic diseases classified elsewhere: Secondary | ICD-10-CM | POA: Diagnosis not present

## 2024-01-18 DIAGNOSIS — G4733 Obstructive sleep apnea (adult) (pediatric): Secondary | ICD-10-CM | POA: Diagnosis present

## 2024-01-18 DIAGNOSIS — Z87891 Personal history of nicotine dependence: Secondary | ICD-10-CM

## 2024-01-18 DIAGNOSIS — J90 Pleural effusion, not elsewhere classified: Secondary | ICD-10-CM | POA: Insufficient documentation

## 2024-01-18 DIAGNOSIS — Z538 Procedure and treatment not carried out for other reasons: Secondary | ICD-10-CM | POA: Diagnosis not present

## 2024-01-18 DIAGNOSIS — E119 Type 2 diabetes mellitus without complications: Secondary | ICD-10-CM | POA: Diagnosis not present

## 2024-01-18 DIAGNOSIS — I5033 Acute on chronic diastolic (congestive) heart failure: Secondary | ICD-10-CM | POA: Diagnosis present

## 2024-01-18 DIAGNOSIS — Z7901 Long term (current) use of anticoagulants: Secondary | ICD-10-CM

## 2024-01-18 DIAGNOSIS — J189 Pneumonia, unspecified organism: Secondary | ICD-10-CM | POA: Diagnosis not present

## 2024-01-18 DIAGNOSIS — Z89412 Acquired absence of left great toe: Secondary | ICD-10-CM

## 2024-01-18 DIAGNOSIS — Z89422 Acquired absence of other left toe(s): Secondary | ICD-10-CM

## 2024-01-18 DIAGNOSIS — Z96652 Presence of left artificial knee joint: Secondary | ICD-10-CM | POA: Diagnosis present

## 2024-01-18 LAB — I-STAT VENOUS BLOOD GAS, ED
Acid-base deficit: 3 mmol/L — ABNORMAL HIGH (ref 0.0–2.0)
Bicarbonate: 20.7 mmol/L (ref 20.0–28.0)
Calcium, Ion: 1.13 mmol/L — ABNORMAL LOW (ref 1.15–1.40)
HCT: 36 % (ref 36.0–46.0)
Hemoglobin: 12.2 g/dL (ref 12.0–15.0)
O2 Saturation: 69 %
Potassium: 4.7 mmol/L (ref 3.5–5.1)
Sodium: 139 mmol/L (ref 135–145)
TCO2: 22 mmol/L (ref 22–32)
pCO2, Ven: 33.9 mmHg — ABNORMAL LOW (ref 44–60)
pH, Ven: 7.395 (ref 7.25–7.43)
pO2, Ven: 36 mmHg (ref 32–45)

## 2024-01-18 LAB — COMPREHENSIVE METABOLIC PANEL WITH GFR
ALT: 9 U/L (ref 0–44)
AST: 18 U/L (ref 15–41)
Albumin: 3.1 g/dL — ABNORMAL LOW (ref 3.5–5.0)
Alkaline Phosphatase: 72 U/L (ref 38–126)
Anion gap: 12 (ref 5–15)
BUN: 29 mg/dL — ABNORMAL HIGH (ref 8–23)
CO2: 19 mmol/L — ABNORMAL LOW (ref 22–32)
Calcium: 9 mg/dL (ref 8.9–10.3)
Chloride: 105 mmol/L (ref 98–111)
Creatinine, Ser: 2.44 mg/dL — ABNORMAL HIGH (ref 0.44–1.00)
GFR, Estimated: 19 mL/min — ABNORMAL LOW (ref >60)
Glucose, Bld: 262 mg/dL — ABNORMAL HIGH (ref 70–99)
Potassium: 4.7 mmol/L (ref 3.5–5.1)
Sodium: 136 mmol/L (ref 135–145)
Total Bilirubin: 0.6 mg/dL (ref 0.0–1.2)
Total Protein: 6.7 g/dL (ref 6.5–8.1)

## 2024-01-18 LAB — CBC WITH DIFFERENTIAL/PLATELET
Abs Immature Granulocytes: 0.07 K/uL (ref 0.00–0.07)
Basophils Absolute: 0.1 K/uL (ref 0.0–0.1)
Basophils Relative: 1 %
Eosinophils Absolute: 0 K/uL (ref 0.0–0.5)
Eosinophils Relative: 0 %
HCT: 38 % (ref 36.0–46.0)
Hemoglobin: 11.2 g/dL — ABNORMAL LOW (ref 12.0–15.0)
Immature Granulocytes: 1 %
Lymphocytes Relative: 5 %
Lymphs Abs: 0.7 K/uL (ref 0.7–4.0)
MCH: 29.5 pg (ref 26.0–34.0)
MCHC: 29.5 g/dL — ABNORMAL LOW (ref 30.0–36.0)
MCV: 100 fL (ref 80.0–100.0)
Monocytes Absolute: 1.1 K/uL — ABNORMAL HIGH (ref 0.1–1.0)
Monocytes Relative: 8 %
Neutro Abs: 12.3 K/uL — ABNORMAL HIGH (ref 1.7–7.7)
Neutrophils Relative %: 85 %
Platelets: 342 K/uL (ref 150–400)
RBC: 3.8 MIL/uL — ABNORMAL LOW (ref 3.87–5.11)
RDW: 16.4 % — ABNORMAL HIGH (ref 11.5–15.5)
WBC: 14.2 K/uL — ABNORMAL HIGH (ref 4.0–10.5)
nRBC: 0 % (ref 0.0–0.2)

## 2024-01-18 LAB — RESPIRATORY PANEL BY PCR

## 2024-01-18 LAB — TROPONIN I (HIGH SENSITIVITY)
Troponin I (High Sensitivity): 33 ng/L — ABNORMAL HIGH (ref <18)
Troponin I (High Sensitivity): 39 ng/L — ABNORMAL HIGH (ref <18)

## 2024-01-18 LAB — CBG MONITORING, ED: Glucose-Capillary: 328 mg/dL — ABNORMAL HIGH (ref 70–99)

## 2024-01-18 MED ORDER — IPRATROPIUM-ALBUTEROL 0.5-2.5 (3) MG/3ML IN SOLN
3.0000 mL | RESPIRATORY_TRACT | Status: DC | PRN
  Administered 2024-01-18: 3 mL via RESPIRATORY_TRACT
  Filled 2024-01-18: qty 3

## 2024-01-18 MED ORDER — ALBUTEROL SULFATE (2.5 MG/3ML) 0.083% IN NEBU
2.5000 mg | INHALATION_SOLUTION | RESPIRATORY_TRACT | Status: DC | PRN
  Administered 2024-01-18 – 2024-01-20 (×6): 2.5 mg via RESPIRATORY_TRACT
  Filled 2024-01-18 (×6): qty 3

## 2024-01-18 MED ORDER — SODIUM CHLORIDE 0.9 % IV SOLN: 2.0000 g | INTRAVENOUS | Status: DC

## 2024-01-18 MED ORDER — INSULIN ASPART 100 UNIT/ML IJ SOLN
0.0000 [IU] | INTRAMUSCULAR | Status: DC
  Administered 2024-01-19: 5 [IU] via SUBCUTANEOUS
  Administered 2024-01-19: 7 [IU] via SUBCUTANEOUS
  Administered 2024-01-19: 2 [IU] via SUBCUTANEOUS
  Administered 2024-01-19: 7 [IU] via SUBCUTANEOUS
  Administered 2024-01-19: 3 [IU] via SUBCUTANEOUS
  Administered 2024-01-19: 5 [IU] via SUBCUTANEOUS
  Administered 2024-01-20: 3 [IU] via SUBCUTANEOUS
  Administered 2024-01-20: 9 [IU] via SUBCUTANEOUS
  Administered 2024-01-20: 5 [IU] via SUBCUTANEOUS
  Administered 2024-01-20: 7 [IU] via SUBCUTANEOUS
  Administered 2024-01-20: 2 [IU] via SUBCUTANEOUS

## 2024-01-18 MED ORDER — IPRATROPIUM-ALBUTEROL 0.5-2.5 (3) MG/3ML IN SOLN
3.0000 mL | Freq: Once | RESPIRATORY_TRACT | Status: AC
  Administered 2024-01-18: 3 mL via RESPIRATORY_TRACT
  Filled 2024-01-18: qty 3

## 2024-01-18 MED ORDER — SODIUM CHLORIDE 0.9 % IV SOLN
2.0000 g | Freq: Once | INTRAVENOUS | Status: AC
  Administered 2024-01-19: 2 g via INTRAVENOUS
  Filled 2024-01-18: qty 20

## 2024-01-18 MED ORDER — AZITHROMYCIN 250 MG PO TABS
500.0000 mg | ORAL_TABLET | Freq: Once | ORAL | Status: AC
  Administered 2024-01-18: 500 mg via ORAL
  Filled 2024-01-18: qty 2

## 2024-01-18 NOTE — Subjective & Objective (Signed)
 Patient coming from home complaining shortness of breath has been short of breath for weeks but has been getting worse today Has tried to use nebulizers at home but does not seem to help EMS started her on CPAP gave her 10 of albuterol  1 Atrovent  125 Solu-Medrol  Patient does has known history of COPD and CHF at baseline uses 3 L of oxygen . Initially started on BiPAP and in emergency department improved was transition back to 4 L but still having some shortness of breath

## 2024-01-18 NOTE — Assessment & Plan Note (Signed)
-  -   Will initiate: Steroid taper  -  Antibiotics azithromycin  - Albuterol   PRN, - scheduled duoneb,  -  Breo or Dulera  at discharge   -  Mucinex .  Titrate O2 to saturation >90%. Follow patients respiratory status.  Order nfluenza PCR   VBG no hypercarbia   -   BiPAP ordered PRN for increased work of breathing.  Currently mentating well no evidence of symptomatic hypercarbia

## 2024-01-18 NOTE — Assessment & Plan Note (Signed)
 -  Continue Lipitor 40 mg daily ?

## 2024-01-18 NOTE — Assessment & Plan Note (Signed)
-   Order Sensitive  SSI   - continue home insulin   50 units,  -  check TSH and HgA1C  - Hold by mouth medications

## 2024-01-18 NOTE — ED Triage Notes (Signed)
 Pt bib [facility] coming from home with CC of shortness of breath. Pt has had sob for a few weeks but has worsened today. Pt reports taking 3 nebs at home before EMS arrival to no relief. Pt arrives on CPAP with EMS with 10 of albuterol , 1 atrovent , 125 of solumedrol. Pt does have COPD, CHF, and wears 3L baseline. Pt reports left sided chest tightness. GCS 15.

## 2024-01-18 NOTE — H&P (Signed)
 Kayla [name] FMW:[id number] DOB: 11-Jul-1940 DOA: 01/18/2024     PCP: Stephanie Charlene CROME, MD   Outpatient Specialists:   CARDS: Dr. Oneil Parchment, MD Nephrology Dr. Nichole Pulmonology Dr.  Patient arrived to ER on 01/18/24 at 1605 Referred by Attending Francesca Elsie CROME, MD   Patient coming from:    home Lives  With family    Chief Complaint:   Chief Complaint  Patient presents with   Respiratory Distress    HPI: Kayla [name] is a 83 y.o. female with medical history significant of COPD, chronic pain syndrome, DM2, end-stage renal disease on hemodialysis only Monday and Friday, paroxysmal atrial fibrillation, history of MRSA bacteremia and infective endocarditis, CAD    Presented with   cough, shortness of breath , no fever, she was so short of breath felt like she ws going to die CP only due to coughing Patient coming from home complaining shortness of breath has been short of breath for weeks but has been getting worse today Has tried to use nebulizers at home but does not seem to help EMS started her on CPAP gave her 10 of albuterol  1 Atrovent  125 Solu-Medrol  Patient does has known history of COPD and CHF at baseline uses 3 L of oxygen . Initially started on BiPAP and in emergency department improved was transition back to 4 L but still having some shortness of breath    The breathing treatment seems to help the most  Denies significant ETOH intake   Does not smoke      Regarding pertinent Chronic problems:     Hyperlipidemia -  on statins Lipitor      HTN on Norvasc   isosorbide    chronic CHF diastolic/systolic/ combined - Demadex   last echo  Recent Results (from the past 56199 hours)  ECHO TEE   Collection Time: 09/07/22 12:22 PM  Result Value   Est EF 60 - 65%   Narrative      TRANSESOPHOGEAL ECHO REPORT      PROCEDURE: After discussion of the risks and benefits of a TEE, an informed consent was obtained from the patient. TEE procedure time was 7  minutes. The transesophogeal probe was passed without difficulty through the esophogus of the patient. Imaged were  obtained with the patient in a left lateral decubitus position. Sedation performed by different physician. The patient was monitored while under deep sedation. Anesthestetic sedation was provided intravenously by Anesthesiology: 95.23mg  of Propofol ,  60mg  of Lidocaine . Image quality was good. The patient's vital signs; including heart rate, blood pressure, and oxygen  saturation; remained stable throughout the procedure. The patient developed no complications during the procedure.   IMPRESSIONS    1. There is a 1.64 cm X 0.78 cm X 0.4 cm vegetation on the right ventricular CIED lead at the level of the right atrium. Right ventricular systolic function is normal. The right ventricular size is mildly enlarged.  2. Left ventricular ejection fraction, by estimation, is 60 to 65%. The left ventricle has normal function.  3. Left atrial size was mildly dilated. No left atrial/left atrial appendage thrombus was detected.  4. Right atrial size was mildly dilated.  5. The mitral valve is degenerative. No evidence of mitral valve regurgitation. No evidence of mitral stenosis. Moderate mitral annular calcification.  6. Tricuspid valve regurgitation is moderate.  7. The aortic valve is tricuspid. Aortic valve regurgitation is trivial. Aortic valve sclerosis is present, with no evidence of aortic valve stenosis.  8. There is Moderate (Grade  III) plaque involving the descending aorta.           CAD  - On   statin,                  -  followed by cardiology                 DM 2 -  Lab Results  Component Value Date   HGBA1C 6.7 (H) 08/29/2023   on insulin       obesity-   BMI Readings from Last 1 Encounters:  05/27/23 32.62 kg/m            COPD  followed by pulmonology    on baseline oxygen          A. Fib -   atrial fibrillation CHA2DS2 vas score    7      current  on  anticoagulation with Eliquis ,       CKD end-stage renal disease on hemodialysis Monday and Friday still makes urine   Lab Results  Component Value Date   CREATININE 2.44 (H) 01/18/2024   CREATININE 2.57 (H) 08/30/2023   CREATININE 2.31 (H) 08/29/2023   Lab Results  Component Value Date   NA 139 01/18/2024   CL 105 01/18/2024   K 4.7 01/18/2024   CO2 19 (L) 01/18/2024   BUN 29 (H) 01/18/2024   CREATININE 2.44 (H) 01/18/2024   GFRNONAA 19 (L) 01/18/2024   CALCIUM  9.0 01/18/2024   PHOS 3.3 11/14/2022   ALBUMIN 3.1 (L) 01/18/2024   GLUCOSE 262 (H) 01/18/2024   Chronic anemia - baseline hg Hemoglobin & Hematocrit  Recent Labs    08/30/23 0416 01/18/24 1622 01/18/24 1628  HGB 9.7* 11.2* 12.2   Iron/TIBC/Ferritin/ %Sat    Component Value Date/Time   IRON 29 10/27/2022 0420   TIBC 224 (L) 10/27/2022 0420   FERRITIN 78 10/27/2022 0420   IRONPCTSAT 13 10/27/2022 0420     While in ER: Clinical Course as of 01/18/24 1939  Tue Jan 18, 2024  1845 Discussed with Dr. Silvester who has admitted the patient for further inpatient management of suspected acute COPD exacerbation.  Chest x-ray without signs of overt pulmonary edema.  She is feeling better with breathing treatments and her symptoms seem most compatible COPD. [WS]    Clinical Course User Index [WS] Francesca Elsie CROME, MD       Lab Orders         Resp panel by RT-PCR (RSV, Flu A&B, Covid) Anterior Nasal Swab         Comprehensive metabolic panel         CBC with Differential         I-Stat venous blood gas, ED        CXR -chronic pulmonary venous hypertension interstitial prominence without overt airspace edema    Following Medications were ordered in ER: Medications  ipratropium-albuterol  (DUONEB) 0.5-2.5 (3) MG/3ML nebulizer solution 3 mL (3 mLs Nebulization Given 01/18/24 1726)  azithromycin  (ZITHROMAX ) tablet 500 mg (has no administration in time range)  ipratropium-albuterol  (DUONEB) 0.5-2.5 (3) MG/3ML  nebulizer solution 3 mL (3 mLs Nebulization Given 01/18/24 1631)    ________    ED Triage Vitals  Encounter Vitals Group     BP 01/18/24 1613 (!) 157/91     Girls Systolic BP Percentile --      Girls Diastolic BP Percentile --      Boys Systolic BP Percentile --      Boys Diastolic BP  Percentile --      Pulse Rate 01/18/24 1613 82     Resp 01/18/24 1613 (!) 29     Temp 01/18/24 1613 97.6 F (36.4 C)     Temp Source 01/18/24 1613 Oral     SpO2 01/18/24 1613 100 %     Weight --      Height --      Head Circumference --      Peak Flow --      Pain Score 01/18/24 1612 7     Pain Loc --      Pain Education --      Exclude from Growth Chart --   UFJK(75)@     _________________________________________ Significant initial  Findings: Abnormal Labs Reviewed  COMPREHENSIVE METABOLIC PANEL WITH GFR - Abnormal; Notable for the following components:      Result Value   CO2 19 (*)    Glucose, Bld 262 (*)    BUN 29 (*)    Creatinine, Ser 2.44 (*)    Albumin 3.1 (*)    GFR, Estimated 19 (*)    All other components within normal limits  CBC WITH DIFFERENTIAL/PLATELET - Abnormal; Notable for the following components:   WBC 14.2 (*)    RBC 3.80 (*)    Hemoglobin 11.2 (*)    MCHC 29.5 (*)    RDW 16.4 (*)    Neutro Abs 12.3 (*)    Monocytes Absolute 1.1 (*)    All other components within normal limits  I-STAT VENOUS BLOOD GAS, ED - Abnormal; Notable for the following components:   pCO2, Ven 33.9 (*)    Acid-base deficit 3.0 (*)    Calcium , Ion 1.13 (*)    All other components within normal limits  TROPONIN I (HIGH SENSITIVITY) - Abnormal; Notable for the following components:   Troponin I (High Sensitivity) 33 (*)    All other components within normal limits    _________________________ Troponin  ordered Cardiac Panel (last 3 results) Recent Labs    01/18/24 1622  TROPONINIHS 33*     ECG: Ordered Personally reviewed and interpreted by me showing: HR : 75 Rhythm: Sinus  rhythm Atrial premature complex Borderline right axis deviation Left ventricular hypertrophy QTC 478  BNP (last 3 results) Recent Labs    08/29/23 2000  BNP 468.9*    The recent clinical data is shown below. Vitals:   01/18/24 1726 01/18/24 1800 01/18/24 1815 01/18/24 1900  BP:  (!) 187/62 (!) 181/61 (!) 174/64  Pulse: 78 70 77 81  Resp: 16 20 18  (!) 22  Temp:      TempSrc:      SpO2: 99% 99% 99% 98%    WBC     Component Value Date/Time   WBC 14.2 (H) 01/18/2024 1622   LYMPHSABS 0.7 01/18/2024 1622   MONOABS 1.1 (H) 01/18/2024 1622   EOSABS 0.0 01/18/2024 1622   BASOSABS 0.1 01/18/2024 1622      Procalcitonin   Ordered      UA not ordered     Results for orders placed or performed during the hospital encounter of 08/29/23  Respiratory (~20 pathogens) panel by PCR     Status: None   Collection Time: 08/29/23 11:24 PM   Specimen: Nasopharyngeal Swab; Respiratory  Result Value Ref Range Status   Adenovirus NOT DETECTED NOT DETECTED Final   Coronavirus 229E NOT DETECTED NOT DETECTED Final    Comment: (NOTE) The Coronavirus on the Respiratory Panel, DOES NOT test for  the novel  Coronavirus (2019 nCoV)    Coronavirus HKU1 NOT DETECTED NOT DETECTED Final   Coronavirus NL63 NOT DETECTED NOT DETECTED Final   Coronavirus OC43 NOT DETECTED NOT DETECTED Final   Metapneumovirus NOT DETECTED NOT DETECTED Final   Rhinovirus / Enterovirus NOT DETECTED NOT DETECTED Final   Influenza A NOT DETECTED NOT DETECTED Final   Influenza B NOT DETECTED NOT DETECTED Final   Parainfluenza Virus 1 NOT DETECTED NOT DETECTED Final   Parainfluenza Virus 2 NOT DETECTED NOT DETECTED Final   Parainfluenza Virus 3 NOT DETECTED NOT DETECTED Final   Parainfluenza Virus 4 NOT DETECTED NOT DETECTED Final   Respiratory Syncytial Virus NOT DETECTED NOT DETECTED Final   Bordetella pertussis NOT DETECTED NOT DETECTED Final   Bordetella Parapertussis NOT DETECTED NOT DETECTED Final   Chlamydophila  pneumoniae NOT DETECTED NOT DETECTED Final   Mycoplasma pneumoniae NOT DETECTED NOT DETECTED Final    Comment: Performed at [facility], [address] [place], [place] [address]   *Note: Due to a large number of results and/or encounters for the requested time period, some results have not been displayed. A complete set of results can be found in Results Review.    ABX started azithromycine   ________________________________________________________________  Venous  Blood Gas result:  pH  7.395 Sodium 139 mmol/L   pCO2, Ven 33.9 Low  mmHg Potassium 4.7 mmol/L  pO2, Ven 36 mmHg     __________________________________________________________ Recent Labs  Lab 01/18/24 1622 01/18/24 1628  NA 136 139  K 4.7 4.7  CO2 19*  --   GLUCOSE 262*  --   BUN 29*  --   CREATININE 2.44*  --   CALCIUM  9.0  --     Cr   stable,   Lab Results  Component Value Date   CREATININE 2.44 (H) 01/18/2024   CREATININE 2.57 (H) 08/30/2023   CREATININE 2.31 (H) 08/29/2023    Recent Labs  Lab 01/18/24 1622  AST 18  ALT 9  ALKPHOS 72  BILITOT 0.6  PROT 6.7  ALBUMIN 3.1*   Lab Results  Component Value Date   CALCIUM  9.0 01/18/2024   PHOS 3.3 11/14/2022    Plt: Lab Results  Component Value Date   PLT 342 01/18/2024       Recent Labs  Lab 01/18/24 1622 01/18/24 1628  WBC 14.2*  --   NEUTROABS 12.3*  --   HGB 11.2* 12.2  HCT 38.0 36.0  MCV 100.0  --   PLT 342  --     HG/HCT  stable,     Component Value Date/Time   HGB 12.2 01/18/2024 1628   HGB 11.3 01/30/2021 1154   HCT 36.0 01/18/2024 1628   HCT 35.9 01/30/2021 1154   MCV 100.0 01/18/2024 1622   MCV 91 01/30/2021 1154    _______________________________________________ Hospitalist was called for admission for   COPD exacerbation   The following Work up has been ordered so far:  Orders Placed This Encounter  Procedures   Critical Care   Resp panel by RT-PCR (RSV, Flu A&B, Covid) Anterior Nasal Swab   DG  Chest Portable 1 View   Comprehensive metabolic panel   CBC with Differential   Consult for Unassigned Medical Admission   Bipap   I-Stat venous blood gas, ED   EKG 12-Lead   ED EKG   EKG 12-Lead   EKG 12-Lead     OTHER Significant initial  Findings:  labs showing:  DM  labs:  HbA1C: Recent Labs    08/29/23 2329  HGBA1C 6.7*      CBG (last 3)  No results for input(s): GLUCAP in the last 72 hours.        Cultures:    Component Value Date/Time   SDES BLOOD SITE NOT SPECIFIED 10/25/2022 0826   SPECREQUEST  10/25/2022 0826    BOTTLES DRAWN AEROBIC AND ANAEROBIC Blood Culture results may not be optimal due to an inadequate volume of blood received in culture bottles   CULT  10/25/2022 0826    NO GROWTH 5 DAYS Performed at [facility], [address], [place], [place] [address]    REPTSTATUS 10/30/2022 FINAL 10/25/2022 9173     Radiological Exams on Admission: CT CHEST WO CONTRAST Result Date: 01/18/2024 CLINICAL DATA:  Shortness of breath EXAM: CT CHEST WITHOUT CONTRAST TECHNIQUE: Multidetector CT imaging of the chest was performed following the standard protocol without IV contrast. RADIATION DOSE REDUCTION: This exam was performed according to the departmental dose-optimization program which includes automated exposure control, adjustment of the mA and/or kV according to patient size and/or use of iterative reconstruction technique. COMPARISON:  Chest x-ray from earlier in the same day. FINDINGS: Cardiovascular: Somewhat limited due to lack of IV contrast. Right jugular dialysis catheter is noted. Atherosclerotic calcifications of the aorta are seen. No aneurysmal dilatation is noted. The heart is not significantly enlarged in size. Heavy coronary calcifications are noted. Mediastinum/Nodes: Thoracic inlet is within normal limits. No hilar or mediastinal adenopathy is noted. The esophagus as visualized is within normal limits. Lungs/Pleura: Small effusion is noted  on the left. Minimal left basilar atelectasis is noted. No focal infiltrate is seen. Diffuse emphysematous changes are noted. Moderate to large right-sided pleural effusion is noted. Lower lobe consolidation is noted greater than that seen on the left. No parenchymal nodules are noted. Upper Abdomen: Visualized upper abdomen is within normal limits. Musculoskeletal: Degenerative changes of the thoracic spine are seen. No acute rib abnormality is noted. Postsurgical changes in the cervical spine are noted. IMPRESSION: Bilateral pleural effusions are right considerably greater than left with lower lobe consolidation. No other acute abnormality is noted. Aortic Atherosclerosis (ICD10-I70.0) and Emphysema (ICD10-J43.9). Electronically Signed   By: Oneil Devonshire M.D.   On: 01/18/2024 23:02   DG Chest Portable 1 View Result Date: 01/18/2024 CLINICAL DATA:  Cough and shortness of breath. EXAM: PORTABLE CHEST 1 VIEW COMPARISON:  08/29/2023 FINDINGS: More prominent heart size. Title dialysis catheter remains in place with the catheter tip in the lower SVC. Chronic pulmonary venous hypertension and interstitial prominence without overt airspace edema or significant pleural effusions. Small amount of bilateral pleural fluid cannot be excluded. No pneumothorax. IMPRESSION: Chronic pulmonary venous hypertension and interstitial prominence without overt airspace edema or significant pleural effusions. Small amount of bilateral pleural fluid cannot be excluded. Electronically Signed   By: Marcey Moan M.D.   On: 01/18/2024 18:07   _______________________________________________________________________________________________________ Latest  Blood pressure (!) 174/64, pulse 81, temperature 97.6 F (36.4 C), temperature source Oral, resp. rate (!) 22, SpO2 98%.   Vitals  labs and radiology finding personally reviewed  Review of Systems:    Pertinent positives include:   fatigue,  Constitutional:  No weight loss,  night sweats, Fevers, chills, weight loss  HEENT:  No headaches, Difficulty swallowing,Tooth/dental problems,Sore throat,  No sneezing, itching, ear ache, nasal congestion, post nasal drip,  Cardio-vascular:  No chest pain, Orthopnea, PND, anasarca, dizziness, palpitations.no Bilateral lower extremity swelling  GI:  No heartburn, indigestion, abdominal pain, nausea, vomiting, diarrhea, change in bowel habits, loss of appetite, melena, blood in stool, hematemesis Resp:  no shortness of breath at rest. No dyspnea on exertion, No excess mucus, no productive cough, No non-productive cough, No coughing up of blood.No change in color of mucus.No wheezing. Skin:  no rash or lesions. No jaundice GU:  no dysuria, change in color of urine, no urgency or frequency. No straining to urinate.  No flank pain.  Musculoskeletal:  No joint pain or no joint swelling. No decreased range of motion. No back pain.  Psych:  No change in mood or affect. No depression or anxiety. No memory loss.  Neuro: no localizing neurological complaints, no tingling, no weakness, no double vision, no gait abnormality, no slurred speech, no confusion  All systems reviewed and apart from HOPI all are negative _______________________________________________________________________________________________ Past Medical History:   Past Medical History:  Diagnosis Date   Allergy    Anxiety    Arthritis    CAD (coronary artery disease)    Mild per remote cath in 2006, /    Nuclear, January, 2013, no significant abnormality,   Carotid artery disease (HCC)    Doppler, January, 2013, 0 39% RIC A., 40-59% LICA, stable   Cataract    CHF (congestive heart failure) (HCC)    Chronic kidney disease    Complication of anesthesia    wakes up mean   COPD 02/16/2007   Intolerant of Spiriva , tudorza Intolerant of Trelegy    COPD (chronic obstructive pulmonary disease) (HCC)    Coronary atherosclerosis 11/24/2008   Catheterization  2006, nonobstructive coronary disease   //   nuclear 2013, low risk     Diabetes mellitus, type 2 (HCC)    Diverticula of colon    DIVERTICULOSIS OF COLON 03/23/2007   Qualifier: Diagnosis of  By: Shellia MD, Vineet     Dizziness    occasional   Dizzy spells 05/04/2011   DM (diabetes mellitus) (HCC) 03/23/2007   Qualifier: Diagnosis of  By: Shellia MD, Vineet     Dyspnea    On exertion   Ejection fraction    Essential hypertension 02/16/2007   Qualifier: Diagnosis of  By: Nicholaus CMA, Tammy     G E R D 03/23/2007   Qualifier: Diagnosis of  By: Shellia MD, Vineet     Gall bladder disease 04/28/2016   Gastritis and gastroduodenitis    GERD (gastroesophageal reflux disease)    Goiter    hx of   Headache(784.0)    migraine   Hyperlipidemia    Hypertension    Hypothyroidism    HYPOTHYROIDISM, POSTSURGICAL 06/04/2008   Qualifier: Diagnosis of  By: Kassie MD, Sean A    Left ventricular hypertrophy    Leg edema    occasional swelling   Leg swelling 12/09/2018   Myofascial pain syndrome, cervical 01/06/2018   Obesity    OBESITY 11/24/2008   Qualifier: Diagnosis of  By: Jaramillo, Luz     OBSTRUCTIVE SLEEP APNEA 02/16/2007   Auto CPAP 09/03/13 to 10/02/13 >> used on 30 of 30 nights with average 6 hrs and 3 min.  Average AHI is 3.1 with median CPAP 5 cm H2O and 95 th percentile CPAP 6 cm H2O.     Occipital neuralgia of right side 01/06/2018   OSA (obstructive sleep apnea)    cpap setting of 12   Pain in joint of right hip 01/21/2018   Palpitations 01/21/2011   48 hour  Holter, 2015, scattered PACs and PVCs.    Paroxysmal atrial fibrillation (HCC)    Pneumonia    PONV (postoperative nausea and vomiting)    severe after every surgery   Pulmonary hypertension, secondary    RHINITIS 07/21/2010        RUQ abdominal pain    S/P left TKA 07/27/2011   Sinus node dysfunction (HCC) 11/08/2015   Sleep apnea    Spinal stenosis of cervical region 11/07/2014   Urinary frequency 12/09/2018   UTI  (urinary tract infection) 08/15/2019   per pt report   VENTRICULAR HYPERTROPHY, LEFT 11/24/2008   Qualifier: Diagnosis of  By: Justina Kos        Past Surgical History:  Procedure Laterality Date   ABDOMINAL HYSTERECTOMY     with appendectomy and colon polypectomy   AMPUTATION TOE Left 12/15/2019   Procedure: AMPUTATION  LEFT GREAT TOE;  Surgeon: Gretel Ozell PARAS, DPM;  Location: [facility];  Service: Podiatry;  Laterality: Left;   AMPUTATION TOE Left 01/22/2022   Procedure: AMPUTATION TOE 4th digit;  Surgeon: Tobie Franky SQUIBB, DPM;  Location: [facility];  Service: Podiatry;  Laterality: Left;   AMPUTATION TOE Left 09/04/2022   Procedure: LEFT BIG TOE AMPUTAION;  Surgeon: Gershon Donnice SAUNDERS, DPM;  Location: [facility];  Service: Podiatry;  Laterality: Left;   ANTERIOR CERVICAL DECOMP/DISCECTOMY FUSION N/A 11/07/2014   Procedure: Anterior Cervical diskectomy and fusion Cervical four-five, Cervical five-six, Cervical six-seven;  Surgeon: Arley Helling, MD;  Location: [facility];  Service: Neurosurgery;  Laterality: N/A;   APPENDECTOMY     ARTERY BIOPSY Right 02/16/2017   Procedure: BIOPSY TEMPORAL ARTERY;  Surgeon: Jama Cordella MATSU, MD;  Location: [facility];  Service: Vascular;  Laterality: Right;   ARTERY BIOPSY Left 04/16/2020   Procedure: BIOPSY TEMPORAL ARTERY;  Surgeon: Jama Cordella MATSU, MD;  Location: [facility];  Service: Vascular;  Laterality: Left;  LEFT TEMPLE   AV FISTULA PLACEMENT Left 04/13/2023   Procedure: LEFT ARM ARTERIOVENOUS (AV) FISTULA CREATION;  Surgeon: Lanis Fonda BRAVO, MD;  Location: [facility];  Service: Vascular;  Laterality: Left;   BACK SURGERY     CARDIAC CATHETERIZATION     CATARACT EXTRACTION W/ INTRAOCULAR LENS  IMPLANT, BILATERAL Bilateral    CHOLECYSTECTOMY N/A 04/29/2016   Procedure: LAPAROSCOPIC CHOLECYSTECTOMY WITH INTRAOPERATIVE CHOLANGIOGRAM;  Surgeon: Alm Angle, MD;  Location: [facility];  Service: General;  Laterality: N/A;   EP IMPLANTABLE DEVICE N/A 11/08/2015   MRI  COMPATABLE -- Procedure: Pacemaker Implant;  Surgeon: Elspeth JAYSON Sage, MD;  Location: [facility];  Service: Cardiovascular;  Laterality: N/A;   INCISION AND DRAINAGE ABSCESS Right 05/30/2021   Procedure: IRRIGATION AND DEBRIDEMENT OF PREPATELLA BURSA;  Surgeon: Ernie Donnice, MD;  Location: [facility];  Service: Orthopedics;  Laterality: Right;  60 wants standard knee draping and pulse lavage   INCISION AND DRAINAGE ABSCESS Right 07/22/2021   Procedure: INCISION AND DRAINAGE ABSCESS RIGHT KNEE;  Surgeon: Ernie Donnice, MD;  Location: [facility];  Service: Orthopedics;  Laterality: Right;   INSERT / REPLACE / REMOVE PACEMAKER  2017   Dr. Beverli [facility])   IR FLUORO GUIDE CV LINE RIGHT  07/25/2021   IR FLUORO GUIDE CV LINE RIGHT  11/04/2022   IR REMOVAL TUN CV CATH W/O FL  09/04/2021   IR US  GUIDE VASC ACCESS RIGHT  07/25/2021   IR US  GUIDE VASC ACCESS RIGHT  11/04/2022   JOINT REPLACEMENT     LEAD EXTRACTION N/A 09/08/2022  Procedure: LEAD EXTRACTION;  Surgeon: Waddell Danelle ORN, MD;  Location: [facility];  Service: Cardiovascular;  Laterality: N/A;   LEAD EXTRACTION N/A 09/11/2022   Procedure: LEAD EXTRACTION;  Surgeon: Waddell Danelle ORN, MD;  Location: [facility];  Service: Cardiovascular;  Laterality: N/A;   LUMBAR DISC SURGERY  1990's   x 2   TEE WITHOUT CARDIOVERSION N/A 09/07/2022   Procedure: TRANSESOPHAGEAL ECHOCARDIOGRAM;  Surgeon: Santo Stanly LABOR, MD;  Location: [facility];  Service: Cardiovascular;  Laterality: N/A;   THYROIDECTOMY  2011   TOTAL KNEE ARTHROPLASTY  07/27/2011   Procedure: TOTAL KNEE ARTHROPLASTY;  Surgeon: Donnice JONETTA Car, MD;  Location: [facility];  Service: Orthopedics;  Laterality: Left;   UPPER ESOPHAGEAL ENDOSCOPIC ULTRASOUND (EUS) N/A 05/14/2016   Procedure: UPPER ESOPHAGEAL ENDOSCOPIC ULTRASOUND (EUS);  Surgeon: Toribio SHAUNNA Cedar, MD;  Location: [facility];  Service: Endoscopy;  Laterality: N/A;  radial only-will be done mod if needed    Social  History:  Ambulatory   walker      reports that she quit smoking about 26 years ago. Her smoking use included cigarettes. She started smoking about 59 years ago. She has a 66 pack-year smoking history. She has never used smokeless tobacco. She reports that she does not drink alcohol  and does not use drugs.     Family History:   Family History  Problem Relation Age of Onset   Heart Problems Mother    Heart defect Mother        valve problems   Heart Problems Father    Heart disease Brother    Lung cancer Daughter        non small cell    Diabetes Maternal Aunt    Diabetes Maternal Uncle    Throat cancer Brother    Colon cancer Neg Hx    Colon polyps Neg Hx    Esophageal cancer Neg Hx    Rectal cancer Neg Hx    Stomach cancer Neg Hx    ______________________________________________________________________________________________ Allergies: Allergies  Allergen Reactions   Oxycodone  Itching and Other (See Comments)    Pt still takes this med even thought it causes itching   Trelegy Ellipta  [Fluticasone -Umeclidin-Vilant] Nausea And Vomiting, Other (See Comments) and Cough    Made the patient sick to her stomach (only) several days after using it    Trulicity [Dulaglutide] Nausea And Vomiting, Nausea Only, Other (See Comments) and Cough    TRULICITY made the patient sick to her stomach (only) several days after using it     Prior to Admission medications   Medication Sig Start Date End Date Taking? Authorizing Provider  acetaminophen  (TYLENOL ) 500 MG tablet Take 1,000-1,500 mg by mouth every 8 (eight) hours as needed for mild pain, moderate pain or headache.    [provider]  albuterol  (PROVENTIL ) (2.5 MG/3ML) 0.083% nebulizer solution Take 3 mLs (2.5 mg total) by nebulization every 2 (two) hours as needed for wheezing or shortness of breath. 05/19/23   Neda Jennet LABOR, MD  albuterol  (VENTOLIN  HFA) 108 (90 Base) MCG/ACT inhaler Inhale 2 puffs into the lungs every 6  (six) hours as needed for wheezing or shortness of breath. 07/03/22   Parrett, Madelin RAMAN, NP  Alcohol  Swabs  (B-D SINGLE USE SWABS  REGULAR) PADS  11/24/19   [provider]  amLODipine  (NORVASC ) 5 MG tablet Take 1 tablet (5 mg total) by mouth daily. 08/30/23   Pokhrel, Vernal, MD  apixaban  (ELIQUIS ) 2.5 MG TABS tablet Take  1 tablet (2.5 mg total) by mouth 2 (two) times daily. 05/19/23   Jeffrie Oneil BROCKS, MD  ascorbic acid  (VITAMIN C ) 1000 MG tablet Take 1,000 mg by mouth daily with lunch.    [provider]  atorvastatin  (LIPITOR) 40 MG tablet Take 1 tablet (40 mg total) by mouth daily. 05/19/23   Jeffrie Oneil BROCKS, MD  benzonatate  (TESSALON ) 100 MG capsule Take 1 capsule (100 mg total) by mouth 3 (three) times daily. 08/30/23   Pokhrel, Laxman, MD  Blood Glucose Calibration (TRUE METRIX LEVEL 1) Low SOLN  11/27/19   [provider]  Budeson-Glycopyrrol-Formoterol  (BREZTRI  AEROSPHERE) 160-9-4.8 MCG/ACT AERO Inhale 2 puffs into the lungs in the morning and at bedtime. 05/19/23   Olalere, Jennet LABOR, MD  Carboxymethylcellulose Sodium (ARTIFICIAL TEARS OP) Place 1 drop into both eyes daily as needed (dry eyes).    [provider]  escitalopram  (LEXAPRO ) 5 MG tablet Take 1 tablet (5 mg total) by mouth daily. 11/14/22   Lue Elsie BROCKS, MD  ferrous sulfate  325 (65 FE) MG tablet Take 1 tablet (325 mg total) by mouth daily with breakfast. 01/26/20   Fernande Elspeth BROCKS, MD  fluticasone  (FLONASE ) 50 MCG/ACT nasal spray USE 2 SPRAYS NASALLY DAILY Patient taking differently: Place 1 spray into both nostrils daily as needed for allergies. 02/26/17   Shelah Lamar RAMAN, MD  gabapentin  (NEURONTIN ) 800 MG tablet Take 800 mg by mouth 3 (three) times daily.    [provider]  guaiFENesin  (ROBITUSSIN) 100 MG/5ML liquid Take 5 mLs by mouth every 4 (four) hours as needed for cough or to loosen phlegm.    [provider]  guaiFENesin -dextromethorphan  (ROBITUSSIN DM) 100-10 MG/5ML syrup  Take 5 mLs by mouth every 4 (four) hours as needed for cough. 08/30/23   Pokhrel, Vernal, MD  HYDROcodone -acetaminophen  (NORCO) 7.5-325 MG tablet Take 1 tablet by mouth 4 (four) times daily. 08/02/23   [provider]  insulin  aspart (NOVOLOG ) 100 UNIT/ML FlexPen Inject 0-20 Units into the skin See admin instructions. Inject 0-20 units into the skin three times a day before meals as needed, per [facility] SCALE    [provider]  isosorbide  mononitrate (IMDUR ) 60 MG 24 hr tablet Take 1 tablet (60 mg total) by mouth daily. 05/19/23   Jeffrie Oneil BROCKS, MD  LANTUS  100 UNIT/ML injection Inject 50 Units into the skin every morning. 03/03/23   [provider]  levothyroxine  (SYNTHROID ) 200 MCG tablet Take 100-200 mcg by mouth See admin instructions. Take 1/2 tablet by mouth every Monday and Friday, then take 1 tablet all other days    [provider]  melatonin 5 MG TABS Take 1 tablet (5 mg total) by mouth at bedtime as needed. 09/15/22   Pokhrel, Laxman, MD  nitroGLYCERIN  (NITROSTAT ) 0.4 MG SL tablet Place 1 tablet (0.4 mg total) under the tongue every 5 (five) minutes as needed for chest pain. 05/19/23   Jeffrie Oneil BROCKS, MD  PRESCRIPTION MEDICATION CPAP- At bedtime    [provider]  promethazine  (PHENERGAN ) 25 MG suppository SMARTSIG:0.5-1 SUPPOS Rectally Every 6 Hours PRN 12/22/22   [provider]  sevelamer  carbonate (RENVELA ) 800 MG tablet Take 1 tablet (800 mg total) by mouth 3 (three) times daily with meals. 11/14/22   Lue Elsie BROCKS, MD  torsemide  (DEMADEX ) 100 MG tablet Take 1 tablet (100 mg total) by mouth 2 (two) times daily. Patient taking differently: Take 100 mg by mouth See admin instructions. Take 1 tablet by mouth daily, except  Monday and Friday 11/14/22   Lue Elsie BROCKS, MD  TRUE METRIX BLOOD GLUCOSE TEST test strip  09/18/17   [provider]  TRUEplus Lancets 28G MISC  11/24/19   [provider]  vitamin B-12  (CYANOCOBALAMIN ) 1000 MCG tablet Take 1,000 mcg by mouth daily with lunch.    [provider]  Vitamin D3 (VITAMIN D ) 25 MCG tablet Take 1,000 Units by mouth daily with lunch.    [provider]    ___________________________________________________________________________________________________ Physical Exam:    01/18/2024    7:00 PM 01/18/2024    6:15 PM 01/18/2024    6:00 PM  Vitals with BMI  Systolic 174 181 812  Diastolic 64 61 62  Pulse 81 77 70     1. General:  in No  Acute distress   Chronically ill-appearing 2. Psychological: Alert and   Oriented 3. Head/ENT:    Dry Mucous Membranes                          Head Non traumatic, neck supple                           Poor Dentition 4. SKIN:  decreased Skin turgor,  Skin clean Dry and intact no rash    5. Heart: Regular rate and rhythm no  Murmur, no Rub or gallop 6. Lungs:   no wheezes or crackles   7. Abdomen: Soft,  non-tender, Non distended  obese  bowel sounds present 8. Lower extremities: no clubbing, cyanosis, no  edema 9. Neurologically Grossly intact, moving all 4 extremities equally   10. MSK: Normal range of motion    Chart has been reviewed  ______________________________________________________________________________________________  Assessment/Plan 83 y.o. female with medical history significant of COPD, chronic pain syndrome, DM2, end-stage renal disease on hemodialysis only Monday and Friday, paroxysmal atrial fibrillation, history of MRSA bacteremia and infective endocarditis, CAD   Admitted for   COPD exacerbation , right pleural effusion, CAP  Present on Admission:  COPD exacerbation (HCC)  COPD with acute exacerbation (HCC)  Paroxysmal atrial fibrillation (HCC)  Elevated troponin  Essential hypertension  Chronic diastolic heart failure (HCC)  Chronic kidney disease, stage 3b (HCC)  Hyperlipidemia associated with type 2 diabetes mellitus (HCC)  HYPOTHYROIDISM, POSTSURGICAL   OBESITY  Anemia of chronic disease due to ESRD  Acute on chronic respiratory failure with hypoxia (HCC)  Pleural effusion  CAP (community acquired pneumonia)     COPD with acute exacerbation (HCC)  -  - Will initiate: Steroid taper  -  Antibiotics azithromycin  - Albuterol   PRN, - scheduled duoneb,  -  Breo or Dulera  at discharge   -  Mucinex .  Titrate O2 to saturation >90%. Follow patients respiratory status.  Order nfluenza PCR   VBG no hypercarbia   -   BiPAP ordered PRN for increased work of breathing.  Currently mentating well no evidence of symptomatic hypercarbia   Paroxysmal atrial fibrillation (HCC) Hold elqiuis for tonight given pleural effusion and need for thoracentesis   Elevated troponin  -no chest pain no EKG changes in the setting of  increased work of breathing and  chronic kidney disease likely due to demand ischemia and poor clearance, monitor on telemetry and cycle cardiac enzymes to trend.  if continues to rise will need further work-up   Essential hypertension Resume amlodipine  7.5 mg daily  Insulin  dependent type 2 diabetes mellitus (HCC)  -  Order Sensitive  SSI   - continue home insulin   50 units,  -  check TSH and HgA1C  - Hold by mouth medications    Chronic diastolic heart failure (HCC) Chronic patient continues to make some urine continue torsemide  100 mg twice daily  Chronic kidney disease, stage 3b (HCC) With end-stage renal disease using hemodialysis on Monday and Friday if patient remains here until Friday will need nephrology consult for hemodialysis  Hyperlipidemia associated with type 2 diabetes mellitus (HCC) Continue Lipitor 40 mg daily  HYPOTHYROIDISM, POSTSURGICAL - Check TSH continue home medications Synthroid  at 200mcg po q day   OBESITY Contributing to comorbidity and complicating medical management    Nutritional follow up as an out pt would be recommended   Anemia of chronic disease due to ESRD Obtain anemia  panel  Transfuse for Hg <7 , rapidly dropping or  if symptomatic   Acute on chronic respiratory failure with hypoxia (HCC) Usually on 3 L FiO2 now requiring up to 4-5 monitoring progressive as patient did briefly required BiPAP likely in the setting of COPD exacerbation  Pleural effusion IR guided thoracentesis ordered For tonight cover for possible pneumonia given evidence of consolidation Fluid studies ordered./ Of note patient is very concerned that she may have a cancer she had lost recently her son due to cancer as well as her sister and has full multiple family members with cancers as well  CAP (community acquired pneumonia)  - -Patient presenting with  productive cough,   hypoxia  , and infiltrate in   lower lobe on chest CT   -This appears to be most likely community-acquired pneumonia.      will admit for treatment of CAP will start on appropriate antibiotic coverage. - Rocephin /azithromycin    Obtain:  sputum cultures,                                 COVID PCR    Pending but unlikely                 blood cultures and sputum cultures ordered                   strep pneumo UA antigen,                    check for Legionella antigen.                Provide oxygen  as needed.    Other plan as per orders.  DVT prophylaxis:  eliquis    Code Status:  DNR/DNI  as per patient   I had personally discussed CODE STATUS with patient and family   ACP  none    Family Communication:   Family  at  Bedside  plan of care was discussed   with  Husband,   Diet  card renal   Disposition Plan:     To home once workup is complete and patient is stable   Following barriers for discharge:                                                        Electrolytes corrected  Work of breathing improves     Consults called:    NONE   Admission status:  ED Disposition     ED Disposition  Admit   Condition  --   Comment  Hospital Area:  [facility] [[id number]]  Level of Care: Progressive [102]  Admit to Progressive based on following criteria: RESPIRATORY PROBLEMS hypoxemic/hypercapnic respiratory failure that is responsive to NIPPV (BiPAP) or High Flow Nasal Cannula (6-80 lpm). Frequent assessment/intervention, no > Q2 hrs < Q4 hrs, to maintain oxygenation and pulmonary hygiene.  May admit patient to [facility] or [facility] if equivalent level of care is available:: No  Covid Evaluation: Asymptomatic - no recent exposure (last 10 days) testing not required  Diagnosis: COPD exacerbation [facility]) [[id number]]  Admitting Physician: Kaitlyne Friedhoff [[id number]]  Attending Physician: Dyrell Tuccillo [[id number]]  Certification:: I certify this patient will need inpatient services for at least 2 midnights  Expected Medical Readiness: 01/20/2024            inpatient     I Expect 2 midnight stay secondary to severity of patient's current illness need for inpatient interventions justified by the following:  hemodynamic instability despite optimal treatment ( tachypnea  hypoxia, )   Severe lab/radiological/exam abnormalities including:    COPD exacerbation CAP  and extensive comorbidities including: DM2    CHF  COPD/asthma    CKD  Chronic anticoagulation  That are currently affecting medical management.   I expect  patient to be hospitalized for 2 midnights requiring inpatient medical care.  Patient is at high risk for adverse outcome (such as loss of life or disability) if not treated.  Indication for inpatient stay as follows:   Hemodynamic instability despite maximal medical therapy,   Need for operative/procedural  intervention New or worsening hypoxia    Need for IV antibiotics,  Need for BIPAP    Level of care  progressive     Mayan Kloepfer 01/18/2024, 11:31 PM    [facility]     after 2 AM please page floor coverage   If 7AM-7PM, please contact the day team taking care of the patient  using [email or web address]

## 2024-01-18 NOTE — Assessment & Plan Note (Signed)
 Usually on 3 L FiO2 now requiring up to 4-5 monitoring progressive as patient did briefly required BiPAP likely in the setting of COPD exacerbation

## 2024-01-18 NOTE — Assessment & Plan Note (Signed)
 Obtain anemia panel  Transfuse for Hg <7 , rapidly dropping or  if symptomatic

## 2024-01-18 NOTE — Assessment & Plan Note (Signed)
- -  Patient presenting with  productive cough,   hypoxia  , and infiltrate in   lower lobe on chest CT   -This appears to be most likely community-acquired pneumonia.      will admit for treatment of CAP will start on appropriate antibiotic coverage. - Rocephin /azithromycin    Obtain:  sputum cultures,                                 COVID PCR    Pending but unlikely                 blood cultures and sputum cultures ordered                   strep pneumo UA antigen,                    check for Legionella antigen.                Provide oxygen  as needed.

## 2024-01-18 NOTE — ED Notes (Signed)
 RT at bedside.

## 2024-01-18 NOTE — Assessment & Plan Note (Signed)
 With end-stage renal disease using hemodialysis on Monday and Friday if patient remains here until Friday will need nephrology consult for hemodialysis

## 2024-01-18 NOTE — Assessment & Plan Note (Addendum)
 Hold elqiuis for tonight given pleural effusion and need for thoracentesis

## 2024-01-18 NOTE — Assessment & Plan Note (Signed)
 Resume amlodipine  7.5 mg daily

## 2024-01-18 NOTE — Assessment & Plan Note (Signed)
 Contributing to comorbidity and complicating medical management     Nutritional follow up as an out pt would be recommended

## 2024-01-18 NOTE — Assessment & Plan Note (Signed)
-  no chest pain no EKG changes in the setting of  increased work of breathing and  chronic kidney disease likely due to demand ischemia and poor clearance, monitor on telemetry and cycle cardiac enzymes to trend. ? if continues to rise will need further work-up ? ?

## 2024-01-18 NOTE — Assessment & Plan Note (Signed)
 IR guided thoracentesis ordered For tonight cover for possible pneumonia given evidence of consolidation Fluid studies ordered./ Of note patient is very concerned that she may have a cancer she had lost recently her son due to cancer as well as her sister and has full multiple family members with cancers as well

## 2024-01-18 NOTE — ED Provider Notes (Signed)
 [place] [facility] Provider Note  CSN: [id number] Arrival date & time: 01/18/24 1605  Chief Complaint(s) Respiratory Distress  HPI Kayla [name] is a 83 y.o. female history of coronary artery disease, CHF CKD, end-stage renal disease on dialysis, COPD, diabetes, hypertension, atrial fibrillation on Eliquis  presenting to the emergency department shortness of breath.  Patient reports gradually worsening shortness of breath over few weeks, worse today.  Today took 3 nebs at home but called paramedics as symptoms are persistent.  Reports some mild chest tightness.  Normally reports she wears oxygen  at 3 L but had returned to 4 L.  Paramedics gave patient steroids, albuterol , and patient does report feeling a bit better.  She was placed on BiPAP.  She denies any fevers or chills but reports she has had some productive cough with some whitish sputum.  No leg swelling.  Reports that she is compliant with dialysis.  Goes twice weekly.   Past Medical History Past Medical History:  Diagnosis Date   Allergy    Anxiety    Arthritis    CAD (coronary artery disease)    Mild per remote cath in 2006, /    Nuclear, January, 2013, no significant abnormality,   Carotid artery disease (HCC)    Doppler, January, 2013, 0 39% RIC A., 40-59% LICA, stable   Cataract    CHF (congestive heart failure) (HCC)    Chronic kidney disease    Complication of anesthesia    wakes up mean   COPD 02/16/2007   Intolerant of Spiriva , tudorza Intolerant of Trelegy    COPD (chronic obstructive pulmonary disease) (HCC)    Coronary atherosclerosis 11/24/2008   Catheterization 2006, nonobstructive coronary disease   //   nuclear 2013, low risk     Diabetes mellitus, type 2 (HCC)    Diverticula of colon    DIVERTICULOSIS OF COLON 03/23/2007   Qualifier: Diagnosis of  By: Shellia MD, Vineet     Dizziness    occasional   Dizzy spells 05/04/2011   DM (diabetes mellitus) (HCC) 03/23/2007    Qualifier: Diagnosis of  By: Shellia MD, Vineet     Dyspnea    On exertion   Ejection fraction    Essential hypertension 02/16/2007   Qualifier: Diagnosis of  By: Nicholaus CMA, Tammy     G E R D 03/23/2007   Qualifier: Diagnosis of  By: Shellia MD, Vineet     Gall bladder disease 04/28/2016   Gastritis and gastroduodenitis    GERD (gastroesophageal reflux disease)    Goiter    hx of   Headache(784.0)    migraine   Hyperlipidemia    Hypertension    Hypothyroidism    HYPOTHYROIDISM, POSTSURGICAL 06/04/2008   Qualifier: Diagnosis of  By: Kassie MD, Sean A    Left ventricular hypertrophy    Leg edema    occasional swelling   Leg swelling 12/09/2018   Myofascial pain syndrome, cervical 01/06/2018   Obesity    OBESITY 11/24/2008   Qualifier: Diagnosis of  By: Jaramillo, Luz     OBSTRUCTIVE SLEEP APNEA 02/16/2007   Auto CPAP 09/03/13 to 10/02/13 >> used on 30 of 30 nights with average 6 hrs and 3 min.  Average AHI is 3.1 with median CPAP 5 cm H2O and 95 th percentile CPAP 6 cm H2O.     Occipital neuralgia of right side 01/06/2018   OSA (obstructive sleep apnea)    cpap setting of 12   Pain  in joint of right hip 01/21/2018   Palpitations 01/21/2011   48 hour Holter, 2015, scattered PACs and PVCs.    Paroxysmal atrial fibrillation (HCC)    Pneumonia    PONV (postoperative nausea and vomiting)    severe after every surgery   Pulmonary hypertension, secondary    RHINITIS 07/21/2010        RUQ abdominal pain    S/P left TKA 07/27/2011   Sinus node dysfunction (HCC) 11/08/2015   Sleep apnea    Spinal stenosis of cervical region 11/07/2014   Urinary frequency 12/09/2018   UTI (urinary tract infection) 08/15/2019   per pt report   VENTRICULAR HYPERTROPHY, LEFT 11/24/2008   Qualifier: Diagnosis of  By: Justina Kos     Patient Active Problem List   Diagnosis Date Noted   COPD exacerbation (HCC) 01/18/2024   COPD with acute exacerbation (HCC) 08/29/2023   Respiratory distress  08/29/2023   ESRD on dialysis MWF schedule (HCC) 08/29/2023   Anemia of chronic disease due to ESRD 08/29/2023   Hypothyroidism 08/29/2023   Chronic pain syndrome 08/29/2023   Generalized anxiety disorder 08/29/2023   Acute respiratory failure with hypoxia and hypercapnia (HCC) 10/25/2022   Junctional bradycardia 10/25/2022   Hyperkalemia 10/25/2022   Bradycardia 10/01/2022   MRSA bacteremia 10/01/2022   AKI (acute kidney injury) (HCC) 10/01/2022   Complication of central venous catheter 10/01/2022   Osteomyelitis (HCC) 10/01/2022   Closed nondisplaced fracture of proximal phalanx of right great toe 09/06/2022   Pyogenic inflammation of bone (HCC) 09/05/2022   Acute hematogenous osteomyelitis of left foot (HCC) 09/04/2022   Diabetic foot infection (HCC) 09/03/2022   Chronic hypoxic respiratory failure (HCC) 02/20/2022   Pleuritic pain 02/20/2022   Toe osteomyelitis, left (HCC) 01/20/2022   Carotid bruit 07/07/2021   Septic prepatellar bursitis of right knee 05/30/2021   Cellulitis of right lower extremity 03/19/2021   Chronic kidney disease, stage 3b (HCC) 03/19/2021   Polymyalgia rheumatica (HCC) 04/15/2020   Temporal arteritis (HCC) 04/15/2020   Orthostatic lightheadedness 01/24/2020   Chronic osteomyelitis involving left ankle and foot (HCC)    Paroxysmal atrial fibrillation (HCC) 10/25/2019   Secondary hypercoagulable state (HCC) 10/25/2019   Numbness and tingling of right leg 10/19/2019   Lumbar radiculopathy 10/19/2019   Chronic diastolic heart failure (HCC) 08/13/2019   Chest pain 08/13/2019   Leukocytosis 08/13/2019   Leg swelling 12/09/2018   Urinary frequency 12/09/2018   Pain in joint of right hip 01/21/2018   Myofascial pain syndrome, cervical 01/06/2018   Occipital neuralgia of right side 01/06/2018   RUQ abdominal pain    Gastritis and gastroduodenitis    Bile duct abnormality    Cholecystitis 04/30/2016   Gall bladder disease 04/28/2016   Sinus node  dysfunction (HCC) 11/08/2015   Ejection fraction    Spinal stenosis of cervical region 11/07/2014   Preoperative clearance 10/09/2014   Carotid artery disease (HCC)    S/P left TKA 07/27/2011   Dizzy spells 05/04/2011   Palpitations 01/21/2011   RHINITIS 07/21/2010   Hyperlipidemia associated with type 2 diabetes mellitus (HCC) 11/24/2008   OBESITY 11/24/2008   Coronary atherosclerosis 11/24/2008   VENTRICULAR HYPERTROPHY, LEFT 11/24/2008   HYPOTHYROIDISM, POSTSURGICAL 06/04/2008   Headache 06/04/2008   Esophageal reflux 03/23/2007   DIVERTICULOSIS OF COLON 03/23/2007   GENERALIZED OSTEOARTHROSIS UNSPECIFIED SITE 03/23/2007   OSA (obstructive sleep apnea) 02/16/2007   Essential hypertension 02/16/2007   COPD (chronic obstructive pulmonary disease) (HCC) 02/16/2007   Home Medication(s) Prior to Admission  medications   Medication Sig Start Date End Date Taking? Authorizing Provider  acetaminophen  (TYLENOL ) 500 MG tablet Take 1,000-1,500 mg by mouth every 8 (eight) hours as needed for mild pain, moderate pain or headache.    [provider]  albuterol  (PROVENTIL ) (2.5 MG/3ML) 0.083% nebulizer solution Take 3 mLs (2.5 mg total) by nebulization every 2 (two) hours as needed for wheezing or shortness of breath. 05/19/23   Neda Jennet LABOR, MD  albuterol  (VENTOLIN  HFA) 108 (90 Base) MCG/ACT inhaler Inhale 2 puffs into the lungs every 6 (six) hours as needed for wheezing or shortness of breath. 07/03/22   Parrett, Madelin RAMAN, NP  Alcohol  Swabs  (B-D SINGLE USE SWABS  REGULAR) PADS  11/24/19   [provider]  amLODipine  (NORVASC ) 5 MG tablet Take 1 tablet (5 mg total) by mouth daily. 08/30/23   Pokhrel, Vernal, MD  apixaban  (ELIQUIS ) 2.5 MG TABS tablet Take 1 tablet (2.5 mg total) by mouth 2 (two) times daily. 05/19/23   Jeffrie Oneil BROCKS, MD  ascorbic acid  (VITAMIN C ) 1000 MG tablet Take 1,000 mg by mouth daily with lunch.    [provider]  atorvastatin  (LIPITOR) 40 MG  tablet Take 1 tablet (40 mg total) by mouth daily. 05/19/23   Jeffrie Oneil BROCKS, MD  benzonatate  (TESSALON ) 100 MG capsule Take 1 capsule (100 mg total) by mouth 3 (three) times daily. 08/30/23   Pokhrel, Laxman, MD  Blood Glucose Calibration (TRUE METRIX LEVEL 1) Low SOLN  11/27/19   [provider]  Budeson-Glycopyrrol-Formoterol  (BREZTRI  AEROSPHERE) 160-9-4.8 MCG/ACT AERO Inhale 2 puffs into the lungs in the morning and at bedtime. 05/19/23   Olalere, Jennet LABOR, MD  Carboxymethylcellulose Sodium (ARTIFICIAL TEARS OP) Place 1 drop into both eyes daily as needed (dry eyes).    [provider]  escitalopram  (LEXAPRO ) 5 MG tablet Take 1 tablet (5 mg total) by mouth daily. 11/14/22   Lue Elsie BROCKS, MD  ferrous sulfate  325 (65 FE) MG tablet Take 1 tablet (325 mg total) by mouth daily with breakfast. 01/26/20   Fernande Elspeth BROCKS, MD  fluticasone  (FLONASE ) 50 MCG/ACT nasal spray USE 2 SPRAYS NASALLY DAILY Patient taking differently: Place 1 spray into both nostrils daily as needed for allergies. 02/26/17   Shelah Lamar RAMAN, MD  gabapentin  (NEURONTIN ) 800 MG tablet Take 800 mg by mouth 3 (three) times daily.    [provider]  guaiFENesin  (ROBITUSSIN) 100 MG/5ML liquid Take 5 mLs by mouth every 4 (four) hours as needed for cough or to loosen phlegm.    [provider]  guaiFENesin -dextromethorphan  (ROBITUSSIN DM) 100-10 MG/5ML syrup Take 5 mLs by mouth every 4 (four) hours as needed for cough. 08/30/23   Pokhrel, Vernal, MD  HYDROcodone -acetaminophen  (NORCO) 7.5-325 MG tablet Take 1 tablet by mouth 4 (four) times daily. 08/02/23   [provider]  insulin  aspart (NOVOLOG ) 100 UNIT/ML FlexPen Inject 0-20 Units into the skin See admin instructions. Inject 0-20 units into the skin three times a day before meals as needed, per [facility] SCALE    [provider]  isosorbide  mononitrate (IMDUR ) 60 MG 24 hr tablet Take 1 tablet (60 mg total) by mouth daily. 05/19/23    Jeffrie Oneil BROCKS, MD  LANTUS  100 UNIT/ML injection Inject 50 Units into the skin every morning. 03/03/23   [provider]  levothyroxine  (SYNTHROID ) 200 MCG tablet Take 100-200 mcg by mouth See admin instructions. Take 1/2 tablet by mouth every Monday and Friday, then take 1 tablet all other  days    [provider]  melatonin 5 MG TABS Take 1 tablet (5 mg total) by mouth at bedtime as needed. 09/15/22   Pokhrel, Laxman, MD  nitroGLYCERIN  (NITROSTAT ) 0.4 MG SL tablet Place 1 tablet (0.4 mg total) under the tongue every 5 (five) minutes as needed for chest pain. 05/19/23   Jeffrie Oneil BROCKS, MD  PRESCRIPTION MEDICATION CPAP- At bedtime    [provider]  promethazine  (PHENERGAN ) 25 MG suppository SMARTSIG:0.5-1 SUPPOS Rectally Every 6 Hours PRN 12/22/22   [provider]  sevelamer  carbonate (RENVELA ) 800 MG tablet Take 1 tablet (800 mg total) by mouth 3 (three) times daily with meals. 11/14/22   Lue Elsie BROCKS, MD  torsemide  (DEMADEX ) 100 MG tablet Take 1 tablet (100 mg total) by mouth 2 (two) times daily. Patient taking differently: Take 100 mg by mouth See admin instructions. Take 1 tablet by mouth daily, except Monday and Friday 11/14/22   Lue Elsie BROCKS, MD  TRUE [facility] BLOOD GLUCOSE TEST test strip  09/18/17   [provider]  TRUEplus Lancets 28G MISC  11/24/19   [provider]  vitamin B-12 (CYANOCOBALAMIN ) 1000 MCG tablet Take 1,000 mcg by mouth daily with lunch.    [provider]  Vitamin D3 (VITAMIN D ) 25 MCG tablet Take 1,000 Units by mouth daily with lunch.    [provider]                                                                                                                                    Past Surgical History Past Surgical History:  Procedure Laterality Date   ABDOMINAL HYSTERECTOMY     with appendectomy and colon polypectomy   AMPUTATION TOE Left 12/15/2019   Procedure: AMPUTATION  LEFT GREAT  TOE;  Surgeon: Gretel Ozell PARAS, DPM;  Location: [facility];  Service: Podiatry;  Laterality: Left;   AMPUTATION TOE Left 01/22/2022   Procedure: AMPUTATION TOE 4th digit;  Surgeon: Tobie Franky SQUIBB, DPM;  Location: [facility];  Service: Podiatry;  Laterality: Left;   AMPUTATION TOE Left 09/04/2022   Procedure: LEFT BIG TOE AMPUTAION;  Surgeon: Gershon Donnice SAUNDERS, DPM;  Location: [facility];  Service: Podiatry;  Laterality: Left;   ANTERIOR CERVICAL DECOMP/DISCECTOMY FUSION N/A 11/07/2014   Procedure: Anterior Cervical diskectomy and fusion Cervical four-five, Cervical five-six, Cervical six-seven;  Surgeon: Arley Helling, MD;  Location: [facility];  Service: Neurosurgery;  Laterality: N/A;   APPENDECTOMY     ARTERY BIOPSY Right 02/16/2017   Procedure: BIOPSY TEMPORAL ARTERY;  Surgeon: Jama Cordella MATSU, MD;  Location: [facility] ORS;  Service: Vascular;  Laterality: Right;   ARTERY BIOPSY Left 04/16/2020   Procedure: BIOPSY TEMPORAL ARTERY;  Surgeon: Jama Cordella MATSU, MD;  Location: [facility] ORS;  Service: Vascular;  Laterality: Left;  LEFT TEMPLE   AV FISTULA PLACEMENT Left 04/13/2023   Procedure: LEFT ARM ARTERIOVENOUS (AV) FISTULA CREATION;  Surgeon:  Lanis Fonda BRAVO, MD;  Location[facility] OR;  Service: Vascular;  Laterality: Left;   BACK SURGERY     CARDIAC CATHETERIZATION     CATARACT EXTRACTION W/ INTRAOCULAR LENS  IMPLANT, BILATERAL Bilateral    CHOLECYSTECTOMY N/A 04/29/2016   Procedure: LAPAROSCOPIC CHOLECYSTECTOMY WITH INTRAOPERATIVE CHOLANGIOGRAM;  Surgeon: Alm Angle, MD;  Location: [facility];  Service: General;  Laterality: N/A;   EP IMPLANTABLE DEVICE N/A 11/08/2015   MRI COMPATABLE -- Procedure: Pacemaker Implant;  Surgeon: Elspeth JAYSON Sage, MD;  Location: [facility];  Service: Cardiovascular;  Laterality: N/A;   INCISION AND DRAINAGE ABSCESS Right 05/30/2021   Procedure: IRRIGATION AND DEBRIDEMENT OF PREPATELLA BURSA;  Surgeon: Ernie Cough, MD;  Location: [facility];  Service: Orthopedics;  Laterality: Right;   60 wants standard knee draping and pulse lavage   INCISION AND DRAINAGE ABSCESS Right 07/22/2021   Procedure: INCISION AND DRAINAGE ABSCESS RIGHT KNEE;  Surgeon: Ernie Cough, MD;  Location: [facility];  Service: Orthopedics;  Laterality: Right;   INSERT / REPLACE / REMOVE PACEMAKER  2017   Dr. Beverli [facility])   IR FLUORO GUIDE CV LINE RIGHT  07/25/2021   IR FLUORO GUIDE CV LINE RIGHT  11/04/2022   IR REMOVAL TUN CV CATH W/O FL  09/04/2021   IR US  GUIDE VASC ACCESS RIGHT  07/25/2021   IR US  GUIDE VASC ACCESS RIGHT  11/04/2022   JOINT REPLACEMENT     LEAD EXTRACTION N/A 09/08/2022   Procedure: LEAD EXTRACTION;  Surgeon: Waddell Danelle ORN, MD;  [facility]: [facility];  Service: Cardiovascular;  Laterality: N/A;   LEAD EXTRACTION N/A 09/11/2022   Procedure: LEAD EXTRACTION;  Surgeon: Waddell Danelle ORN, MD;  Location: [facility];  Service: Cardiovascular;  Laterality: N/A;   LUMBAR DISC SURGERY  1990's   x 2   TEE WITHOUT CARDIOVERSION N/A 09/07/2022   Procedure: TRANSESOPHAGEAL ECHOCARDIOGRAM;  Surgeon: Santo Stanly LABOR, MD;  Location: [facility];  Service: Cardiovascular;  Laterality: N/A;   THYROIDECTOMY  2011   TOTAL KNEE ARTHROPLASTY  07/27/2011   Procedure: TOTAL KNEE ARTHROPLASTY;  Surgeon: Cough JONETTA Ernie, MD;  Location: [facility];  Service: Orthopedics;  Laterality: Left;   UPPER ESOPHAGEAL ENDOSCOPIC ULTRASOUND (EUS) N/A 05/14/2016   Procedure: UPPER ESOPHAGEAL ENDOSCOPIC ULTRASOUND (EUS);  Surgeon: Toribio SHAUNNA Cedar, MD;  Location: [facility];  Service: Endoscopy;  Laterality: N/A;  radial only-will be done mod if needed   Family History Family History  Problem Relation Age of Onset   Heart Problems Mother    Heart defect Mother        valve problems   Heart Problems Father    Heart disease Brother    Lung cancer Daughter        non small cell    Diabetes Maternal Aunt    Diabetes Maternal Uncle    Throat cancer Brother    Colon cancer Neg Hx    Colon polyps Neg Hx     Esophageal cancer Neg Hx    Rectal cancer Neg Hx    Stomach cancer Neg Hx     Social History Social History   Tobacco Use   Smoking status: Former    Current packs/day: 0.00    Average packs/day: 2.0 packs/day for 33.0 years (66.0 ttl pk-yrs)    Types: Cigarettes    Start date: 71    Quit date: 05/04/1997    Years since quitting: 26.7   Smokeless tobacco: Never  Vaping Use  Vaping status: Never Used  Substance Use Topics   Alcohol  use: No   Drug use: No   Allergies Oxycodone , Trelegy ellipta  [fluticasone -umeclidin-vilant], and Trulicity [dulaglutide]  Review of Systems Review of Systems  All other systems reviewed and are negative.   Physical Exam Vital Signs  I have reviewed the triage vital signs BP (!) 174/64   Pulse 81   Temp 97.6 F (36.4 C) (Oral)   Resp (!) 22   SpO2 98%  Physical Exam Vitals and nursing note reviewed.  Constitutional:      General: She is in acute distress.     Appearance: She is well-developed. She is obese.  HENT:     Head: Normocephalic and atraumatic.     Mouth/Throat:     Mouth: Mucous membranes are moist.  Eyes:     Pupils: Pupils are equal, round, and reactive to light.  Cardiovascular:     Rate and Rhythm: Normal rate and regular rhythm.     Heart sounds: No murmur heard. Pulmonary:     Effort: Tachypnea present.     Comments: CPAP on, mild accessory muscle use.  Lungs overall seem clear with some trace wheeze.  However patient very dyspneic and work of breathing significantly increased just sitting up in bed Abdominal:     General: Abdomen is flat.     Palpations: Abdomen is soft.     Tenderness: There is no abdominal tenderness.  Musculoskeletal:        General: No tenderness.     Right lower leg: No edema.     Left lower leg: No edema.  Skin:    General: Skin is warm and dry.  Neurological:     General: No focal deficit present.     Mental Status: She is alert. Mental status is at baseline.  Psychiatric:         Mood and Affect: Mood normal.        Behavior: Behavior normal.     ED Results and Treatments Labs (all labs ordered are listed, but only abnormal results are displayed) Labs Reviewed  COMPREHENSIVE METABOLIC PANEL WITH GFR - Abnormal; Notable for the following components:      Result Value   CO2 19 (*)    Glucose, Bld 262 (*)    BUN 29 (*)    Creatinine, Ser 2.44 (*)    Albumin 3.1 (*)    GFR, Estimated 19 (*)    All other components within normal limits  CBC WITH DIFFERENTIAL/PLATELET - Abnormal; Notable for the following components:   WBC 14.2 (*)    RBC 3.80 (*)    Hemoglobin 11.2 (*)    MCHC 29.5 (*)    RDW 16.4 (*)    Neutro Abs 12.3 (*)    Monocytes Absolute 1.1 (*)    All other components within normal limits  I-STAT VENOUS BLOOD GAS, ED - Abnormal; Notable for the following components:   pCO2, Ven 33.9 (*)    Acid-base deficit 3.0 (*)    Calcium , Ion 1.13 (*)    All other components within normal limits  TROPONIN I (HIGH SENSITIVITY) - Abnormal; Notable for the following components:   Troponin I (High Sensitivity) 33 (*)    All other components within normal limits  RESP PANEL BY RT-PCR (RSV, FLU A&B, COVID)  RVPGX2  RESPIRATORY PANEL BY PCR  PROTIME-INR  PROCALCITONIN  PREALBUMIN  CK  MAGNESIUM   PHOSPHORUS  CREATININE, URINE, RANDOM  OSMOLALITY, URINE  OSMOLALITY  SODIUM, URINE,  RANDOM  VITAMIN B12  FOLATE  IRON AND TIBC  FERRITIN  RETICULOCYTES  LACTIC ACID, PLASMA  LACTIC ACID, PLASMA  TROPONIN I (HIGH SENSITIVITY)                                                                                                                          Radiology DG Chest Portable 1 View Result Date: 01/18/2024 CLINICAL DATA:  Cough and shortness of breath. EXAM: PORTABLE CHEST 1 VIEW COMPARISON:  08/29/2023 FINDINGS: More prominent heart size. Title dialysis catheter remains in place with the catheter tip in the lower SVC. Chronic pulmonary venous hypertension  and interstitial prominence without overt airspace edema or significant pleural effusions. Small amount of bilateral pleural fluid cannot be excluded. No pneumothorax. IMPRESSION: Chronic pulmonary venous hypertension and interstitial prominence without overt airspace edema or significant pleural effusions. Small amount of bilateral pleural fluid cannot be excluded. Electronically Signed   By: Marcey Moan M.D.   On: 01/18/2024 18:07    Pertinent labs & imaging results that were available during my care of the patient were reviewed by me and considered in my medical decision making (see MDM for details).  Medications Ordered in ED Medications  ipratropium-albuterol  (DUONEB) 0.5-2.5 (3) MG/3ML nebulizer solution 3 mL (3 mLs Nebulization Given 01/18/24 1726)  azithromycin  (ZITHROMAX ) tablet 500 mg (has no administration in time range)  ipratropium-albuterol  (DUONEB) 0.5-2.5 (3) MG/3ML nebulizer solution 3 mL (3 mLs Nebulization Given 01/18/24 1631)                                                                                                                                     Procedures .Critical Care  Performed by: Francesca Elsie CROME, MD Authorized by: Francesca Elsie CROME, MD   Critical care provider statement:    Critical care time (minutes):  30   Critical care was necessary to treat or prevent imminent or life-threatening deterioration of the following conditions:  Respiratory failure   Critical care was time spent personally by me on the following activities:  Development of treatment plan with patient or surrogate, discussions with consultants, evaluation of patient's response to treatment, examination of patient, ordering and review of laboratory studies, ordering and review of radiographic studies, ordering and performing treatments and interventions, pulse oximetry, re-evaluation of patient's condition and review of old charts   Care discussed with: admitting provider      (  including critical care time)  Medical Decision Making / ED Course   MDM:  83 year old presenting to the emergency department shortness of breath.  Patient mild distress due to work of breathing, reports overall she is feeling much better than on arrival of paramedics.  suspect likely COPD exacerbation given improvement with COPD treatment.  Lower concern for other dangerous process such as pneumothorax, differential also includes pneumonia given cough so we will obtain chest x-ray.  She did report some mild chest pain, tightness, seems most consistent with COPD but will check troponin and EKG.  Doubt other process such as PE as patient is on chronic anticoagulation.  Will reassess.  She was still quite dyspneic just sitting up in bed so may still need admission  Clinical Course as of 01/18/24 1945  Tue Jan 18, 2024  [id number] Discussed with Dr. Silvester who has admitted the patient for further inpatient management of suspected acute COPD exacerbation.  Chest x-ray without signs of overt pulmonary edema.  She is feeling better with breathing treatments and her symptoms seem most compatible COPD. [WS]    Clinical Course User Index [WS] Francesca Elsie CROME, MD     Additional history obtained: -Additional history obtained from ems and spouse -External records from outside source obtained and reviewed including: Chart review including previous notes, labs, imaging, consultation notes including prior notes    Lab Tests: -I ordered, reviewed, and interpreted labs.   The pertinent results include:   Labs Reviewed  COMPREHENSIVE METABOLIC PANEL WITH GFR - Abnormal; Notable for the following components:      Result Value   CO2 19 (*)    Glucose, Bld 262 (*)    BUN 29 (*)    Creatinine, Ser 2.44 (*)    Albumin 3.1 (*)    GFR, Estimated 19 (*)    All other components within normal limits  CBC WITH DIFFERENTIAL/PLATELET - Abnormal; Notable for the following components:   WBC 14.2 (*)     RBC 3.80 (*)    Hemoglobin 11.2 (*)    MCHC 29.5 (*)    RDW 16.4 (*)    Neutro Abs 12.3 (*)    Monocytes Absolute 1.1 (*)    All other components within normal limits  I-STAT VENOUS BLOOD GAS, ED - Abnormal; Notable for the following components:   pCO2, Ven 33.9 (*)    Acid-base deficit 3.0 (*)    Calcium , Ion 1.13 (*)    All other components within normal limits  TROPONIN I (HIGH SENSITIVITY) - Abnormal; Notable for the following components:   Troponin I (High Sensitivity) 33 (*)    All other components within normal limits  RESP PANEL BY RT-PCR (RSV, FLU A&B, COVID)  RVPGX2  RESPIRATORY PANEL BY PCR  PROTIME-INR  PROCALCITONIN  PREALBUMIN  CK  MAGNESIUM   PHOSPHORUS  CREATININE, URINE, RANDOM  OSMOLALITY, URINE  OSMOLALITY  SODIUM, URINE, RANDOM  VITAMIN B12  FOLATE  IRON AND TIBC  FERRITIN  RETICULOCYTES  LACTIC ACID, PLASMA  LACTIC ACID, PLASMA  TROPONIN I (HIGH SENSITIVITY)    Notable for mild leukocytosis   EKG   EKG Interpretation Date/Time:  Tuesday January 18 2024 16:16:17 EDT Ventricular Rate:  74 PR Interval:    QRS Duration:  83 QT Interval:  421 QTC Calculation: 468 R Axis:   89  Text Interpretation: Normal sinus rhythm Borderline right axis deviation Consider left ventricular hypertrophy Confirmed by Francesca Elsie ([id number]) on 01/18/2024 4:32:57 PM  Imaging Studies ordered: I ordered imaging studies including CXR On my interpretation imaging demonstrates no ptx or obvious infiltrate  I independently visualized and interpreted imaging. I agree with the radiologist interpretation   Medicines ordered and prescription drug management: Meds ordered this encounter  Medications   ipratropium-albuterol  (DUONEB) 0.5-2.5 (3) MG/3ML nebulizer solution 3 mL   ipratropium-albuterol  (DUONEB) 0.5-2.5 (3) MG/3ML nebulizer solution 3 mL   azithromycin  (ZITHROMAX ) tablet 500 mg    -I have reviewed the patients home medicines and have made  adjustments as needed   Social Determinants of Health:  Diagnosis or treatment significantly limited by social determinants of health: obesity   Reevaluation: After the interventions noted above, I reevaluated the patient and found that their symptoms have improved  Co morbidities that complicate the patient evaluation  Past Medical History:  Diagnosis Date   Allergy    Anxiety    Arthritis    CAD (coronary artery disease)    Mild per remote cath in 2006, /    Nuclear, January, 2013, no significant abnormality,   Carotid artery disease (HCC)    Doppler, January, 2013, 0 39% RIC A., 40-59% LICA, stable   Cataract    CHF (congestive heart failure) (HCC)    Chronic kidney disease    Complication of anesthesia    wakes up mean   COPD 02/16/2007   Intolerant of Spiriva , tudorza Intolerant of Trelegy    COPD (chronic obstructive pulmonary disease) (HCC)    Coronary atherosclerosis 11/24/2008   Catheterization 2006, nonobstructive coronary disease   //   nuclear 2013, low risk     Diabetes mellitus, type 2 (HCC)    Diverticula of colon    DIVERTICULOSIS OF COLON 03/23/2007   Qualifier: Diagnosis of  By: Shellia MD, Vineet     Dizziness    occasional   Dizzy spells 05/04/2011   DM (diabetes mellitus) (HCC) 03/23/2007   Qualifier: Diagnosis of  By: Shellia MD, Vineet     Dyspnea    On exertion   Ejection fraction    Essential hypertension 02/16/2007   Qualifier: Diagnosis of  By: Nicholaus CMA, Tammy     G E R D 03/23/2007   Qualifier: Diagnosis of  By: Shellia MD, Vineet     Gall bladder disease 04/28/2016   Gastritis and gastroduodenitis    GERD (gastroesophageal reflux disease)    Goiter    hx of   Headache(784.0)    migraine   Hyperlipidemia    Hypertension    Hypothyroidism    HYPOTHYROIDISM, POSTSURGICAL 06/04/2008   Qualifier: Diagnosis of  By: Kassie MD, Sean A    Left ventricular hypertrophy    Leg edema    occasional swelling   Leg swelling 12/09/2018    Myofascial pain syndrome, cervical 01/06/2018   Obesity    OBESITY 11/24/2008   Qualifier: Diagnosis of  By: Jaramillo, Luz     OBSTRUCTIVE SLEEP APNEA 02/16/2007   Auto CPAP 09/03/13 to 10/02/13 >> used on 30 of 30 nights with average 6 hrs and 3 min.  Average AHI is 3.1 with median CPAP 5 cm H2O and 95 th percentile CPAP 6 cm H2O.     Occipital neuralgia of right side 01/06/2018   OSA (obstructive sleep apnea)    cpap setting of 12   Pain in joint of right hip 01/21/2018   Palpitations 01/21/2011   48 hour Holter, 2015, scattered PACs and PVCs.    Paroxysmal atrial fibrillation (HCC)    Pneumonia  PONV (postoperative nausea and vomiting)    severe after every surgery   Pulmonary hypertension, secondary    RHINITIS 07/21/2010        RUQ abdominal pain    S/P left TKA 07/27/2011   Sinus node dysfunction (HCC) 11/08/2015   Sleep apnea    Spinal stenosis of cervical region 11/07/2014   Urinary frequency 12/09/2018   UTI (urinary tract infection) 08/15/2019   per pt report   VENTRICULAR HYPERTROPHY, LEFT 11/24/2008   Qualifier: Diagnosis of  By: Jaramillo, Luz        Dispostion: Disposition decision including need for hospitalization was considered, and patient admitted to the hospital.    Final Clinical Impression(s) / ED Diagnoses Final diagnoses:  COPD exacerbation (HCC)     This chart was dictated using voice recognition software.  Despite best efforts to proofread,  errors can occur which can change the documentation meaning.    Francesca Elsie CROME, MD 01/18/24 1945

## 2024-01-18 NOTE — Assessment & Plan Note (Signed)
-   Check TSH continue home medications Synthroid at po q day

## 2024-01-18 NOTE — Assessment & Plan Note (Signed)
 Chronic patient continues to make some urine continue torsemide  100 mg twice daily

## 2024-01-19 ENCOUNTER — Ambulatory Visit (HOSPITAL_COMMUNITY): Admission: RE | Admit: 2024-01-19 | Source: Home / Self Care | Admitting: Vascular Surgery

## 2024-01-19 ENCOUNTER — Inpatient Hospital Stay (HOSPITAL_COMMUNITY)

## 2024-01-19 ENCOUNTER — Other Ambulatory Visit: Payer: Self-pay

## 2024-01-19 ENCOUNTER — Encounter (HOSPITAL_COMMUNITY)
Admission: EM | Disposition: A | Discharge status: Home Health | Payer: Self-pay | Source: Home / Self Care | Attending: Internal Medicine

## 2024-01-19 DIAGNOSIS — J441 Chronic obstructive pulmonary disease with (acute) exacerbation: Secondary | ICD-10-CM | POA: Diagnosis not present

## 2024-01-19 DIAGNOSIS — R0609 Other forms of dyspnea: Secondary | ICD-10-CM

## 2024-01-19 DIAGNOSIS — I5032 Chronic diastolic (congestive) heart failure: Secondary | ICD-10-CM | POA: Diagnosis not present

## 2024-01-19 DIAGNOSIS — I10 Essential (primary) hypertension: Secondary | ICD-10-CM | POA: Diagnosis not present

## 2024-01-19 DIAGNOSIS — N186 End stage renal disease: Secondary | ICD-10-CM

## 2024-01-19 DIAGNOSIS — I480 Paroxysmal atrial fibrillation: Secondary | ICD-10-CM | POA: Diagnosis not present

## 2024-01-19 LAB — PREALBUMIN: Prealbumin: 18 mg/dL (ref 18–38)

## 2024-01-19 LAB — ECHOCARDIOGRAM COMPLETE
AR max vel: 2.92 cm2
AV Area VTI: 2.68 cm2
AV Area mean vel: 2.79 cm2
AV Mean grad: 11 mmHg
AV Peak grad: 19.5 mmHg
Ao pk vel: 2.21 m/s
Area-P 1/2: 4.39 cm2
Height: 65 in
S' Lateral: 3.6 cm
Weight: 3025.6 [oz_av]

## 2024-01-19 LAB — CBC
HCT: 36.7 % (ref 36.0–46.0)
Hemoglobin: 11 g/dL — ABNORMAL LOW (ref 12.0–15.0)
MCH: 28.9 pg (ref 26.0–34.0)
MCHC: 30 g/dL (ref 30.0–36.0)
MCV: 96.6 fL (ref 80.0–100.0)
Platelets: 366 K/uL (ref 150–400)
RBC: 3.8 MIL/uL — ABNORMAL LOW (ref 3.87–5.11)
RDW: 16.5 % — ABNORMAL HIGH (ref 11.5–15.5)
WBC: 11.3 K/uL — ABNORMAL HIGH (ref 4.0–10.5)
nRBC: 0 % (ref 0.0–0.2)

## 2024-01-19 LAB — GLUCOSE, CAPILLARY
Glucose-Capillary: 186 mg/dL — ABNORMAL HIGH (ref 70–99)
Glucose-Capillary: 228 mg/dL — ABNORMAL HIGH (ref 70–99)
Glucose-Capillary: 287 mg/dL — ABNORMAL HIGH (ref 70–99)
Glucose-Capillary: 288 mg/dL — ABNORMAL HIGH (ref 70–99)
Glucose-Capillary: 299 mg/dL — ABNORMAL HIGH (ref 70–99)
Glucose-Capillary: 340 mg/dL — ABNORMAL HIGH (ref 70–99)

## 2024-01-19 LAB — LACTIC ACID, PLASMA: Lactic Acid, Venous: 1 mmol/L (ref 0.5–1.9)

## 2024-01-19 LAB — OSMOLALITY: Osmolality: 319 mosm/kg — ABNORMAL HIGH (ref 275–295)

## 2024-01-19 LAB — MAGNESIUM: Magnesium: 1.9 mg/dL (ref 1.7–2.4)

## 2024-01-19 LAB — RETICULOCYTES
Immature Retic Fract: 12.9 % (ref 2.3–15.9)
RBC.: 3.79 MIL/uL — ABNORMAL LOW (ref 3.87–5.11)
Retic Count, Absolute: 81.1 K/uL (ref 19.0–186.0)
Retic Ct Pct: 2.1 % (ref 0.4–3.1)

## 2024-01-19 LAB — PROTIME-INR
INR: 1.3 — ABNORMAL HIGH (ref 0.8–1.2)
Prothrombin Time: 17 s — ABNORMAL HIGH (ref 11.4–15.2)

## 2024-01-19 LAB — FOLATE: Folate: 12.3 ng/mL (ref >5.9)

## 2024-01-19 LAB — CK: Total CK: 83 U/L (ref 38–234)

## 2024-01-19 LAB — IRON AND TIBC
Iron: 24 ug/dL — ABNORMAL LOW (ref 28–170)
Saturation Ratios: 10 % — ABNORMAL LOW (ref 10.4–31.8)
TIBC: 249 ug/dL — ABNORMAL LOW (ref 250–450)
UIBC: 225 ug/dL

## 2024-01-19 LAB — FERRITIN: Ferritin: 654 ng/mL — ABNORMAL HIGH (ref 11–307)

## 2024-01-19 LAB — HEMOGLOBIN A1C
Hgb A1c MFr Bld: 6.1 % — ABNORMAL HIGH (ref 4.8–5.6)
Mean Plasma Glucose: 128.37 mg/dL

## 2024-01-19 LAB — TROPONIN I (HIGH SENSITIVITY): Troponin I (High Sensitivity): 39 ng/L — ABNORMAL HIGH (ref <18)

## 2024-01-19 LAB — VITAMIN B12: Vitamin B-12: 968 pg/mL — ABNORMAL HIGH (ref 180–914)

## 2024-01-19 LAB — PROCALCITONIN: Procalcitonin: 0.14 ng/mL

## 2024-01-19 LAB — PHOSPHORUS: Phosphorus: 3.4 mg/dL (ref 2.5–4.6)

## 2024-01-19 SURGERY — A/V SHUNT INTERVENTION
Anesthesia: LOCAL | Laterality: Left

## 2024-01-19 MED ORDER — BUDESONIDE 0.25 MG/2ML IN SUSP
0.2500 mg | Freq: Two times a day (BID) | RESPIRATORY_TRACT | Status: DC
  Administered 2024-01-19: 0.25 mg via RESPIRATORY_TRACT
  Filled 2024-01-19: qty 2

## 2024-01-19 MED ORDER — AMLODIPINE BESYLATE 2.5 MG PO TABS
7.5000 mg | ORAL_TABLET | Freq: Every day | ORAL | Status: DC
  Administered 2024-01-19 – 2024-01-20 (×2): 7.5 mg via ORAL
  Filled 2024-01-19 (×2): qty 1

## 2024-01-19 MED ORDER — GABAPENTIN 400 MG PO CAPS
800.0000 mg | ORAL_CAPSULE | Freq: Three times a day (TID) | ORAL | Status: DC
  Administered 2024-01-19 – 2024-01-20 (×4): 800 mg via ORAL
  Filled 2024-01-19 (×4): qty 2

## 2024-01-19 MED ORDER — INSULIN GLARGINE 100 UNIT/ML ~~LOC~~ SOLN
25.0000 [IU] | Freq: Every morning | SUBCUTANEOUS | Status: DC
  Administered 2024-01-20: 25 [IU] via SUBCUTANEOUS
  Filled 2024-01-19: qty 0.25

## 2024-01-19 MED ORDER — AZITHROMYCIN 250 MG PO TABS
500.0000 mg | ORAL_TABLET | Freq: Every day | ORAL | Status: DC
  Administered 2024-01-19 – 2024-01-20 (×2): 500 mg via ORAL
  Filled 2024-01-19 (×2): qty 2

## 2024-01-19 MED ORDER — SODIUM CHLORIDE 0.9% FLUSH
3.0000 mL | Freq: Two times a day (BID) | INTRAVENOUS | Status: DC
  Administered 2024-01-19 – 2024-01-20 (×3): 3 mL via INTRAVENOUS

## 2024-01-19 MED ORDER — GABAPENTIN 800 MG PO TABS: 800.0000 mg | ORAL_TABLET | Freq: Three times a day (TID) | ORAL | Status: DC

## 2024-01-19 MED ORDER — ESCITALOPRAM OXALATE 10 MG PO TABS
5.0000 mg | ORAL_TABLET | Freq: Every day | ORAL | Status: DC
  Administered 2024-01-19 – 2024-01-20 (×2): 5 mg via ORAL
  Filled 2024-01-19 (×2): qty 1

## 2024-01-19 MED ORDER — INSULIN GLARGINE 100 UNIT/ML ~~LOC~~ SOLN
50.0000 [IU] | Freq: Every morning | SUBCUTANEOUS | Status: DC
Start: 2024-01-19 — End: 2024-01-19
  Administered 2024-01-19: 50 [IU] via SUBCUTANEOUS
  Filled 2024-01-19: qty 0.5

## 2024-01-19 MED ORDER — GABAPENTIN 400 MG PO CAPS
800.0000 mg | ORAL_CAPSULE | Freq: Once | ORAL | Status: AC
  Administered 2024-01-19: 800 mg via ORAL
  Filled 2024-01-19: qty 2

## 2024-01-19 MED ORDER — SODIUM CHLORIDE 0.9% FLUSH: 3.0000 mL | INTRAVENOUS | Status: DC | PRN

## 2024-01-19 MED ORDER — ATORVASTATIN CALCIUM 40 MG PO TABS
40.0000 mg | ORAL_TABLET | Freq: Every day | ORAL | Status: DC
  Administered 2024-01-19 – 2024-01-20 (×2): 40 mg via ORAL
  Filled 2024-01-19 (×2): qty 1

## 2024-01-19 MED ORDER — IPRATROPIUM-ALBUTEROL 0.5-2.5 (3) MG/3ML IN SOLN
3.0000 mL | Freq: Four times a day (QID) | RESPIRATORY_TRACT | Status: DC
  Filled 2024-01-19: qty 3

## 2024-01-19 MED ORDER — HYDROCODONE-ACETAMINOPHEN 5-325 MG PO TABS
1.0000 | ORAL_TABLET | ORAL | Status: DC | PRN
  Filled 2024-01-19: qty 1

## 2024-01-19 MED ORDER — GUAIFENESIN ER 600 MG PO TB12
600.0000 mg | ORAL_TABLET | Freq: Two times a day (BID) | ORAL | Status: DC
  Administered 2024-01-19 – 2024-01-20 (×3): 600 mg via ORAL
  Filled 2024-01-19 (×3): qty 1

## 2024-01-19 MED ORDER — ONDANSETRON HCL 4 MG PO TABS: 4.0000 mg | ORAL_TABLET | Freq: Four times a day (QID) | ORAL | Status: DC | PRN

## 2024-01-19 MED ORDER — ISOSORBIDE MONONITRATE ER 60 MG PO TB24
60.0000 mg | ORAL_TABLET | Freq: Every day | ORAL | Status: DC
  Administered 2024-01-19 – 2024-01-20 (×2): 60 mg via ORAL
  Filled 2024-01-19 (×2): qty 1

## 2024-01-19 MED ORDER — BUDESON-GLYCOPYRROL-FORMOTEROL 160-9-4.8 MCG/ACT IN AERO
2.0000 | INHALATION_SPRAY | Freq: Two times a day (BID) | RESPIRATORY_TRACT | Status: DC
Start: 2024-01-19 — End: 2024-01-20
  Administered 2024-01-19 – 2024-01-20 (×2): 2 via RESPIRATORY_TRACT
  Filled 2024-01-19: qty 5.9

## 2024-01-19 MED ORDER — ACETAMINOPHEN 650 MG RE SUPP: 650.0000 mg | Freq: Four times a day (QID) | RECTAL | Status: DC | PRN

## 2024-01-19 MED ORDER — LEVOTHYROXINE SODIUM 100 MCG PO TABS: 100.0000 ug | ORAL_TABLET | ORAL | Status: DC

## 2024-01-19 MED ORDER — SODIUM CHLORIDE 0.9 % IV SOLN: 250.0000 mL | INTRAVENOUS | Status: AC | PRN

## 2024-01-19 MED ORDER — METHYLPREDNISOLONE SODIUM SUCC 40 MG IJ SOLR
40.0000 mg | Freq: Two times a day (BID) | INTRAMUSCULAR | Status: DC
  Administered 2024-01-19: 40 mg via INTRAVENOUS
  Filled 2024-01-19: qty 1

## 2024-01-19 MED ORDER — SODIUM CHLORIDE 0.9% FLUSH
3.0000 mL | Freq: Two times a day (BID) | INTRAVENOUS | Status: DC
  Administered 2024-01-19 (×2): 3 mL via INTRAVENOUS

## 2024-01-19 MED ORDER — SEVELAMER CARBONATE 800 MG PO TABS
800.0000 mg | ORAL_TABLET | Freq: Three times a day (TID) | ORAL | Status: DC
  Administered 2024-01-19 – 2024-01-20 (×6): 800 mg via ORAL
  Filled 2024-01-19 (×6): qty 1

## 2024-01-19 MED ORDER — LEVOTHYROXINE SODIUM 100 MCG PO TABS
200.0000 ug | ORAL_TABLET | ORAL | Status: DC
  Administered 2024-01-19 – 2024-01-20 (×2): 200 ug via ORAL
  Filled 2024-01-19 (×2): qty 2

## 2024-01-19 MED ORDER — METHYLPREDNISOLONE SODIUM SUCC 40 MG IJ SOLR
40.0000 mg | Freq: Two times a day (BID) | INTRAMUSCULAR | Status: DC
  Administered 2024-01-19 – 2024-01-20 (×2): 40 mg via INTRAVENOUS
  Filled 2024-01-19 (×3): qty 1

## 2024-01-19 MED ORDER — SODIUM CHLORIDE 0.9 % IV SOLN: 500.0000 mg | INTRAVENOUS | Status: DC

## 2024-01-19 MED ORDER — TORSEMIDE 20 MG PO TABS
100.0000 mg | ORAL_TABLET | ORAL | Status: DC
  Administered 2024-01-19 – 2024-01-20 (×2): 100 mg via ORAL
  Filled 2024-01-19 (×2): qty 5

## 2024-01-19 MED ORDER — ACETAMINOPHEN 325 MG PO TABS
650.0000 mg | ORAL_TABLET | Freq: Four times a day (QID) | ORAL | Status: DC | PRN
  Administered 2024-01-19: 650 mg via ORAL
  Filled 2024-01-19: qty 2

## 2024-01-19 MED ORDER — METHYLPREDNISOLONE SODIUM SUCC 40 MG IJ SOLR: 40.0000 mg | Freq: Two times a day (BID) | INTRAMUSCULAR | Status: DC

## 2024-01-19 MED ORDER — PREDNISONE 20 MG PO TABS: 40.0000 mg | ORAL_TABLET | Freq: Every day | ORAL | Status: DC

## 2024-01-19 MED ORDER — ONDANSETRON HCL 4 MG/2ML IJ SOLN: 4.0000 mg | Freq: Four times a day (QID) | INTRAMUSCULAR | Status: DC | PRN

## 2024-01-19 MED ORDER — PROSOURCE PLUS PO LIQD
30.0000 mL | Freq: Two times a day (BID) | ORAL | Status: DC
  Administered 2024-01-19 – 2024-01-20 (×3): 30 mL via ORAL
  Filled 2024-01-19 (×3): qty 30

## 2024-01-19 MED ORDER — AZITHROMYCIN 250 MG PO TABS
500.0000 mg | ORAL_TABLET | Freq: Every day | ORAL | Status: DC
Start: 2024-01-20 — End: 2024-01-19

## 2024-01-19 MED ORDER — LIDOCAINE-EPINEPHRINE 1 %-1:100000 IJ SOLN
INTRAMUSCULAR | Status: AC
  Filled 2024-01-19: qty 1

## 2024-01-19 NOTE — Progress Notes (Addendum)
 Initial Nutrition Assessment  DOCUMENTATION CODES:   Obesity unspecified  INTERVENTION:  -Recommend 2 g Na+ menu, regular texture, thin liquids -Add ProSource Plus BID to promote protein intake -Daily weights -Continue home phos binder (sevelamer  1 w/meals TID)   NUTRITION DIAGNOSIS:   Increased nutrient needs related to chronic illness as evidenced by estimated needs.  GOAL:   Patient will meet greater than or equal to 90% of their needs  MONITOR:   PO intake, Supplement acceptance, Labs, Skin, Weight trends  REASON FOR ASSESSMENT:   Consult Assessment of nutrition requirement/status  ASSESSMENT:   Hx COPD, chronic pain syndrome, DM2, end-stage renal disease on hemodialysis only Monday and Friday, paroxysmal atrial fibrillation, MRSA bacteremia and infective endocarditis, CAD. Present with cough and SOB  Pt with no noted n/v/c/d or chewing swallowing difficulties. Last BM PTA (admit last night). Documented meal intake 100%. Will add ProSource Plus 2x/day to promote adequate protein intake. Documented weight stable long term. Renal Nutrition related lab values WNL. BMI slightly above goal range for geriatric parameters. A1c 6.1, within goal range for existing DM2. No there concerns at this time, will continue to monitor, RDN available prn.   Labs BG 186-288 BUN 29 Cr 2.44 Albumin 3.1 GFR 19 Hemoglobin 11.0  Na 139 K 4.7 Calcium  9.0 Corr ca 9.7 Phos 3.4 A1c 6.1  Medications  amLODipine   7.5 mg Oral Daily   atorvastatin   40 mg Oral Daily   azithromycin   500 mg Oral Daily   budesonide -glycopyrrolate -formoterol   2 puff Inhalation BID   escitalopram   5 mg Oral Daily   gabapentin   800 mg Oral TID   guaiFENesin   600 mg Oral BID   insulin  aspart  0-9 Units Subcutaneous Q4H   insulin  glargine  50 Units Subcutaneous q morning   isosorbide  mononitrate  60 mg Oral Daily   [START ON 01/21/2024] levothyroxine   100 mcg Oral Once per day on Monday Friday   And    levothyroxine   200 mcg Oral Once per day on Sunday Tuesday Wednesday Thursday Saturday   methylPREDNISolone  (SOLU-MEDROL ) injection  40 mg Intravenous Q12H   sevelamer  carbonate  800 mg Oral TID WC   sodium chloride  flush  3 mL Intravenous Q12H   sodium chloride  flush  3 mL Intravenous Q12H   torsemide   100 mg Oral Once per day on Sunday Tuesday Wednesday Thursday Saturday    Diet Order:   Diet Order             Diet Heart Fluid consistency: Thin  Diet effective now                   EDUCATION NEEDS:   No education needs have been identified at this time  Skin:  Skin Assessment: Reviewed RN Assessment  Last BM:  PTA  Height:   Ht Readings from Last 1 Encounters:  01/19/24 5' 5 (1.651 m)    Weight:   Wt Readings from Last 1 Encounters:  01/19/24 85.8 kg    BMI:  Body mass index is 31.47 kg/m.  Estimated Nutritional Needs:   Kcal:  1600-1850 kcal  Protein:  80-100 g  Fluid:  1000 ml + UO/day  Ninette Cotta Daml-Budig, RDN, LDN Registered Dietitian Nutritionist RD Inpatient Contact Info in [place]

## 2024-01-19 NOTE — Progress Notes (Signed)
 OT Cancellation Note  Patient Details Name: Kayla [name] MRN: [id number] DOB: [birth year]   Cancelled Treatment:    Reason Eval/Treat Not Completed: Patient declined, no reason specified. Pt sitting EOB, declining to participate in any therapy until she has eaten breakfast. Will follow up as able to.   Matej Sappenfield C, OT  Acute Rehabilitation Services Office [phone] Secure chat preferred   Adrianne GORMAN Savers 01/19/2024, 9:20 AM

## 2024-01-19 NOTE — Assessment & Plan Note (Signed)
 Continue blood pressure control with amlodipine .  Continue isosorbide 

## 2024-01-19 NOTE — Plan of Care (Signed)
  Problem: Education: Goal: Ability to describe self-care measures that may prevent or decrease complications (Diabetes Survival Skills Education) will improve Outcome: Progressing   Problem: Coping: Goal: Ability to adjust to condition or change in health will improve Outcome: Progressing   Problem: Skin Integrity: Goal: Risk for impaired skin integrity will decrease Outcome: Progressing   Problem: Tissue Perfusion: Goal: Adequacy of tissue perfusion will improve Outcome: Progressing   Problem: Activity: Goal: Ability to tolerate increased activity will improve Outcome: Progressing   Problem: Respiratory: Goal: Ability to maintain adequate ventilation will improve Outcome: Progressing   Problem: Education: Goal: Knowledge of General Education information will improve Description: Including pain rating scale, medication(s)/side effects and non-pharmacologic comfort measures Outcome: Progressing   Problem: Clinical Measurements: Goal: Ability to maintain clinical measurements within normal limits will improve Outcome: Progressing   Problem: Activity: Goal: Risk for activity intolerance will decrease Outcome: Progressing   Problem: Coping: Goal: Level of anxiety will decrease Outcome: Progressing   Problem: Pain Managment: Goal: General experience of comfort will improve and/or be controlled Outcome: Progressing   Problem: Safety: Goal: Ability to remain free from injury will improve Outcome: Progressing   Problem: Education: Goal: Knowledge of disease or condition will improve Outcome: Progressing   Problem: Activity: Goal: Ability to tolerate increased activity will improve Outcome: Progressing

## 2024-01-19 NOTE — ED Notes (Signed)
 Pt reports feeling SOB still with minimal improvement after the duonebs. Pt offered for bipap and RT notified.

## 2024-01-19 NOTE — Progress Notes (Signed)
  Progress Note   Patient: Kayla [name] FMW:[id number] DOB: 1940-11-17 DOA: 01/18/2024     1 DOS: the patient was seen and examined on 01/19/2024   Brief hospital course: Kayla [name] was admitted to the hospital with the working diagnosis of COPD exacerbation.   83 yo female with the past medical history of COPD, chronic pain syndrome,T2DM, ESRD on HD, atrial fibrillation and history of MRSA bacteremia.  Reported worsening dyspnea for several weeks, along with chest pain. Because acute worsening of her symptoms EMS was called, she was found in respiratory distress, she was placed on Cpap and transported to the ED.    Assessment and Plan: * COPD with acute exacerbation (HCC) Not at baseline Plan to continue bronchodilator therapy and inhaled corticosteroids Systemic corticosteroids and airway clearing techniques with flutter valve and incentive spirometer.  Continue 02 monitoring  Oral macrolide antibiotic therapy.   Essential hypertension Continue blood pressure control with amlodipine .  Continue isosorbide    Paroxysmal atrial fibrillation (HCC) Continue rate controlled Patient not on anticoagulation.     ESRD (end stage renal disease) (HCC) HD on Monday and Friday.  Plan for fistulogram on Friday if patient still inpatient.    Chronic diastolic heart failure (HCC) No signs of significant volume overload Will continue diuretic therapy with torsemide  100 mg and blood pressure control.    Hyperlipidemia associated with type 2 diabetes mellitus (HCC) Continue glucose cover and monitoring with insulin  sliding scale Basal insulin  will reduce to 25 units to avoid hypoglycemia.   HYPOTHYROIDISM, POSTSURGICAL Continue with levothyroxine          Subjective: patient continue to have dyspnea, and wheezing, no cough, she wants to go home.   Physical Exam: Vitals:   01/19/24 0510 01/19/24 0757 01/19/24 0854 01/19/24 0909  BP: (!) 166/75 (!) 183/66 (!) 165/147 (!) 165/62   Pulse: 77 84 78 70  Resp: (!) 23 (!) 25 (!) 24 19  Temp: 98.8 F (37.1 C) 98.5 F (36.9 C)    TempSrc: Oral Oral    SpO2: 98% 99% 98% 100%  Weight:      Height:       Neurology awake and alert, deconditioned ENT with positive pallor with no icterus Cardiovascular with S1 and S2 present and regular with no gallops, or rubs Respiratory with rales at bases, and scattered rhonchi, prolonged expiratory phase  Abdomen with no distention  Lower extremity edema +   Data Reviewed:    Family Communication: I spoke with patient's husband at the bedside, we talked in detail about patient's condition, plan of care and prognosis and all questions were addressed.   Disposition: Status is: Inpatient Remains inpatient appropriate because: Copd exacerbation   Planned Discharge Destination: Home     Author: Elidia Toribio Furnace, MD 01/19/2024 10:48 AM  For on call review [email or web address].

## 2024-01-19 NOTE — Evaluation (Signed)
 Occupational Therapy Evaluation & Discharge Patient Details Name: Kayla [name] MRN: [id number] DOB: 1941/02/12 Today's Date: 01/19/2024   History of Present Illness   Pt is a 83 y.o. female presenting 9/16 with SOB. Work up showed bil pleural effusions, R>L and CAP. Admitted for COPD exacerbation. PMH: obesity, DM II, HTN, HLD, CAD, CHF, paroxysmal afib, S/p ICD, CKD, COPD 3L at baseline, hypothyroidism, chronic venous insufficiency, s/p left first ray amputation (09/2022).     Clinical Impressions Pt admitted based on above, and was seen based on problem list below. PTA pt was mod I with ADLs and IADLs with use of rollator. Today pt is at mod I for ADLs. Pt able to complete standing grooming tasks, >10 minutes at sink with mildly decreased activity tolerance. No SOB observed, no rest breaks needed. Provided and reviewed energy conservation handout with pt and spouse to improve activity tolerance at home. Pt received on 3L, reporting that is her baseline O2 but requesting to be placed on 4L. Session conducted on 4L O2 stats sustaining 94% or greater. Pt reporting no concerns for ADLs upon d/c, no follow up OT needs. All education complete, no further acute OT needs identified. OT is signing off on this pt.        If plan is discharge home, recommend the following:   Supervision due to cognitive status;Assistance with cooking/housework     Functional Status Assessment   Patient has not had a recent decline in their functional status     Equipment Recommendations   None recommended by OT      Precautions/Restrictions   Precautions Precautions: Fall Recall of Precautions/Restrictions: Intact Restrictions Weight Bearing Restrictions Per Provider Order: No     Mobility Bed Mobility Overal bed mobility: Modified Independent     General bed mobility comments: No assist, HOB minimally elevated    Transfers Overall transfer level: Needs assistance Equipment used:  Rolling walker (2 wheels) Transfers: Sit to/from Stand Sit to Stand: Supervision           General transfer comment: S for safety      Balance Overall balance assessment: Mild deficits observed, not formally tested       ADL either performed or assessed with clinical judgement   ADL Overall ADL's : Modified independent;At baseline       General ADL Comments: Pt at baseline of mod I for ADLs, completing standing grooming tasks mild decreased activity tolerance but no rest breaks needed     Vision Baseline Vision/History: 1 Wears glasses Patient Visual Report: No change from baseline Vision Assessment?: No apparent visual deficits;Wears glasses for reading            Pertinent Vitals/Pain Pain Assessment Pain Assessment: No/denies pain     Extremity/Trunk Assessment Upper Extremity Assessment Upper Extremity Assessment: Generalized weakness   Lower Extremity Assessment Lower Extremity Assessment: Defer to PT evaluation       Communication Communication Communication: No apparent difficulties   Cognition Arousal: Alert Behavior During Therapy: Agitated Cognition: No apparent impairments     OT - Cognition Comments: Pt mildly impulsive, primarily agitated d/t care being received, provided therapeutic listening and empathy       Following commands: Intact       Cueing  General Comments   Cueing Techniques: Verbal cues  Pt received on 3L, requesting to be placed on 4L. Increased to 4L O2 levels stable on 4L. Access Code: [facility]  URL: [email or web address]  Date: 01/19/2024  Prepared by: Adrianne Savers  Patient Education  - Careers adviser During Daily Tasks           Home Living Family/patient expects to be discharged to:: Private residence Living Arrangements: Spouse/significant other Available Help at Discharge: Family;Available 24 hours/day Type of Home: House Home Access: Ramped  entrance     Home Layout: One level     Bathroom Shower/Tub: Chief Strategy Officer: Handicapped height Bathroom Accessibility: Yes How Accessible: Accessible via walker Home Equipment: Rolling Walker (2 wheels);Cane - single point;Shower seat;Tub bench;Rollator (4 wheels)   Additional Comments: 3L at baseline      Prior Functioning/Environment Prior Level of Function : Independent/Modified Independent             Mobility Comments: Pt uses rollator in home and in community ADLs Comments: Mod I use of tub bench, rollator    OT Problem List: Decreased safety awareness;Cardiopulmonary status limiting activity;Decreased activity tolerance        OT Goals(Current goals can be found in the care plan section)   Acute Rehab OT Goals Patient Stated Goal: To get medicine OT Goal Formulation: All assessment and education complete, DC therapy Time For Goal Achievement: 02/02/24 Potential to Achieve Goals: Good   AM-PAC OT 6 Clicks Daily Activity     Outcome Measure Help from another person eating meals?: None Help from another person taking care of personal grooming?: None Help from another person toileting, which includes using toliet, bedpan, or urinal?: None Help from another person bathing (including washing, rinsing, drying)?: None Help from another person to put on and taking off regular upper body clothing?: None Help from another person to put on and taking off regular lower body clothing?: None 6 Click Score: 24   End of Session Equipment Utilized During Treatment: Rolling walker (2 wheels);Oxygen  (4L) Nurse Communication: Mobility status  Activity Tolerance: Patient tolerated treatment well Patient left: in bed;with call bell/phone within reach;with nursing/sitter in room  OT Visit Diagnosis: Unsteadiness on feet (R26.81);Other abnormalities of gait and mobility (R26.89);Muscle weakness (generalized) (M62.81)                Time: 8865-8842 OT  Time Calculation (min): 23 min Charges:  OT General Charges $OT Visit: 1 Visit OT Evaluation $OT Eval Moderate Complexity: 1 Mod  Adrianne BROCKS, OT  Acute Rehabilitation Services Office [phone] Secure chat preferred   Adrianne GORMAN Savers 01/19/2024, 12:50 PM

## 2024-01-19 NOTE — Assessment & Plan Note (Signed)
 No signs of significant volume overload Will continue diuretic therapy with torsemide  100 mg and blood pressure control.

## 2024-01-19 NOTE — Assessment & Plan Note (Signed)
 Pacemaker lead extracted due to MRSA bacteremia in the past.  Sick sinu syndrome with bradycardia.   Resume anticoagulation with apixaban  2.5 mg po bid.

## 2024-01-19 NOTE — Hospital Course (Signed)
 Kayla [name] was admitted to the hospital with the working diagnosis of COPD exacerbation.   83 yo female with the past medical history of COPD, chronic pain syndrome,T2DM, ESRD on HD, atrial fibrillation and history of MRSA bacteremia.  Reported worsening dyspnea for several weeks, along with chest pain. Because acute worsening of her symptoms EMS was called, she was found in respiratory distress, she was placed on Cpap and transported to the ED.  On her initial physical examination her blood pressure was 187/62, HR 81 RR 22 and 02 saturation 98%  Lungs with prolonged expiratory phase and bilateral rhonchi, heart with S1 and S2 present and regular, abdomen with no distention and trace lower extremity edema.   VBG 7.39/ 33.9/ 36/ 20/ 69%  NA 136, K 4.7 Cl 105 bicarbonate 19, glucose 262, bun 29 cr 2,44  AST 18 ALT 9  Wbc 14.2 hgb 11.2 plt 342   Chest radiograph with hyperinflation, positive cardiomegaly, with bilateral hilar vascular congestion, bilateral interstitial infiltrates, more at lower zones.  HD catheter in the internal jugular vein.   EKG 74 bpm, normal axis, normal intervals, qtc 468, junctional rhythm with no significant ST segment or T wave changes.   Patient was placed on systemic and inhaled corticosteroids. Bronchodilator therapy.  09/18 respiratory status has improved, patient anxious to go home.

## 2024-01-19 NOTE — Assessment & Plan Note (Signed)
 Patient was placed on insulin  sliding scale for glucose cover and monitoring  Basal insulin .

## 2024-01-19 NOTE — Assessment & Plan Note (Signed)
 Symptoms have improved, she back to baseline supplemental 02 per [place] at 3 L/min with 02 saturation 97%   Plan to continue bronchodilator therapy and inhaled corticosteroids Airway clearing techniques with flutter valve and incentive spirometer.  Complete 5 days of macrolide antibiotic therapy.  Prednisone  oral 40 mg for 5 days more.

## 2024-01-19 NOTE — Assessment & Plan Note (Signed)
 Continue with levothyroxine

## 2024-01-19 NOTE — Assessment & Plan Note (Signed)
 HD on Monday and Friday.  Plan for fistulogram on Friday, but patient would like to do as outpatient.

## 2024-01-19 NOTE — Procedures (Signed)
 Patient presents for therapeutic and diagnostic thoracentesis. I was present during limited US  of the chest to evaluate for pleural effusion. US  limited shows minimal pleural fluid with consolidation/atelectatic lung. There is no adequate pocket of fluid to safely proceed with thoracentesis. Due to circumstances unable to perform a safe thoracentesis.   After discussion of the risks versus the benefits of the procedure the patient elected to defer the thoracentesis at this time.  Procedure not performed.   Jaise Moser CHRISTELLA Bal PA-C 01/19/2024 9:49 AM

## 2024-01-19 NOTE — TOC Initial Note (Addendum)
 Transition of Care [facility]) - Initial/Assessment Note    Patient Details  Name: Kayla [name] MRN: [id number] Date of Birth: May 28, 1940  Transition of Care [facility]) CM/SW Contact:    Sudie Erminio Deems, RN Phone Number: 01/19/2024, 11:24 AM  Clinical Narrative: Risk for readmission assessment completed. Patient presented for respiratory distress. Patient has a hx of COPD and is on oxygen  3 liters with [employer]. PTA patient was from home with spouse. Patient has additional DME cane, rolling walker, and wheelchair. Inpatient Case Manager (ICM) discussed home health needs and educated the patient on the benefits of a Home Health RN. Patient is agreeable to services and has used [facility] in the past and she wants to use them again. Case Manager did submit referral to [facility] and the office has accepted-earliest visit will be this Saturday. MD is aware to place orders and F2F. Spouse is at the bedside during the visit and will provide transportation home vie private vehicle. Patient will have portable oxygen  tank for travel. No further needs identified at this time.      01-19-24 1426 Patient declines HH PT Services. No further Inpatient Case Manager needs identified.  Expected Discharge Plan: Home w Home Health Services Barriers to Discharge: Continued Medical Work up   Patient Goals and CMS Choice Patient states their goals for this hospitalization and ongoing recovery are:: wants to return home with some medicine.   Choice offered to / list presented to : Patient (Has used [facility] in the past and wants to use them again.)      Expected Discharge Plan and Services In-house Referral: NA Discharge Planning Services: CM Consult Post Acute Care Choice: Home Health Living arrangements for the past 2 months: Single Family Home                   DME Agency: NA       HH Arranged: RN, Disease Management HH Agency: [facility] Date HH Agency Contacted: 01/19/24 Time  HH Agency Contacted: [phone] Representative spoke with at [facility] Agency: Arna  Prior Living Arrangements/Services Living arrangements for the past 2 months: Single Family Home Lives with:: Spouse Patient language and need for interpreter reviewed:: Yes Do you feel safe going back to the place where you live?: Yes      Need for Family Participation in Patient Care: Yes (Comment) Care giver support system in place?: Yes (comment) Current home services: DME (cane, rolling walker, wheelchair and oxygen  via [employer] @ 3 Liters.) Criminal Activity/Legal Involvement Pertinent to Current Situation/Hospitalization: No - Comment as needed  Activities of Daily Living   ADL Screening (condition at time of admission) Independently performs ADLs?: Yes (appropriate for developmental age) Is the patient deaf or have difficulty hearing?: No Does the patient have difficulty seeing, even when wearing glasses/contacts?: No Does the patient have difficulty concentrating, remembering, or making decisions?: No  Permission Sought/Granted Permission sought to share information with : Family Supports, Magazine features editor, Case Estate manager/land agent granted to share information with : Yes, Verbal Permission Granted     Permission granted to share info w AGENCY: [facility]        Emotional Assessment Appearance:: Appears stated age Attitude/Demeanor/Rapport: Unable to Assess Affect (typically observed): Unable to Assess Orientation: : Oriented to Self, Oriented to Place, Oriented to Situation, Oriented to  Time Alcohol  / Substance Use: Not Applicable Psych Involvement: No (comment)  Admission diagnosis:  COPD exacerbation (HCC) [J44.1] Patient Active Problem List  Diagnosis Date Noted   COPD exacerbation (HCC) 01/18/2024   Elevated troponin 01/18/2024   Pleural effusion 01/18/2024   CAP (community acquired pneumonia) 01/18/2024   COPD with acute exacerbation (HCC) 08/29/2023    Respiratory distress 08/29/2023   ESRD on dialysis MWF schedule (HCC) 08/29/2023   Anemia of chronic disease due to ESRD 08/29/2023   Hypothyroidism 08/29/2023   Chronic pain syndrome 08/29/2023   Generalized anxiety disorder 08/29/2023   Acute on chronic respiratory failure with hypoxia (HCC) 10/25/2022   Junctional bradycardia 10/25/2022   Hyperkalemia 10/25/2022   Bradycardia 10/01/2022   MRSA bacteremia 10/01/2022   AKI (acute kidney injury) (HCC) 10/01/2022   Complication of central venous catheter 10/01/2022   Osteomyelitis (HCC) 10/01/2022   Closed nondisplaced fracture of proximal phalanx of right great toe 09/06/2022   Pyogenic inflammation of bone (HCC) 09/05/2022   Acute hematogenous osteomyelitis of left foot (HCC) 09/04/2022   Diabetic foot infection (HCC) 09/03/2022   Chronic hypoxic respiratory failure (HCC) 02/20/2022   Pleuritic pain 02/20/2022   Toe osteomyelitis, left (HCC) 01/20/2022   Carotid bruit 07/07/2021   Septic prepatellar bursitis of right knee 05/30/2021   Cellulitis of right lower extremity 03/19/2021   Chronic kidney disease, stage 3b (HCC) 03/19/2021   Polymyalgia rheumatica (HCC) 04/15/2020   Temporal arteritis (HCC) 04/15/2020   Orthostatic lightheadedness 01/24/2020   Chronic osteomyelitis involving left ankle and foot (HCC)    Paroxysmal atrial fibrillation (HCC) 10/25/2019   Secondary hypercoagulable state (HCC) 10/25/2019   Numbness and tingling of right leg 10/19/2019   Lumbar radiculopathy 10/19/2019   Chronic diastolic heart failure (HCC) 08/13/2019   Chest pain 08/13/2019   Leukocytosis 08/13/2019   Leg swelling 12/09/2018   Urinary frequency 12/09/2018   Pain in joint of right hip 01/21/2018   Myofascial pain syndrome, cervical 01/06/2018   Occipital neuralgia of right side 01/06/2018   RUQ abdominal pain    Gastritis and gastroduodenitis    Bile duct abnormality    Cholecystitis 04/30/2016   Gall bladder disease 04/28/2016    Sinus node dysfunction (HCC) 11/08/2015   Ejection fraction    Spinal stenosis of cervical region 11/07/2014   Preoperative clearance 10/09/2014   Carotid artery disease (HCC)    S/P left TKA 07/27/2011   Dizzy spells 05/04/2011   Palpitations 01/21/2011   RHINITIS 07/21/2010   Hyperlipidemia associated with type 2 diabetes mellitus (HCC) 11/24/2008   OBESITY 11/24/2008   Coronary atherosclerosis 11/24/2008   VENTRICULAR HYPERTROPHY, LEFT 11/24/2008   HYPOTHYROIDISM, POSTSURGICAL 06/04/2008   Headache 06/04/2008   Insulin  dependent type 2 diabetes mellitus (HCC) 03/23/2007   Esophageal reflux 03/23/2007   DIVERTICULOSIS OF COLON 03/23/2007   GENERALIZED OSTEOARTHROSIS UNSPECIFIED SITE 03/23/2007   OSA (obstructive sleep apnea) 02/16/2007   Essential hypertension 02/16/2007   COPD (chronic obstructive pulmonary disease) (HCC) 02/16/2007   PCP:  Stephanie Charlene CROME, MD Pharmacy:   [id number] [address] [place], [place] - [address] AT [facility] [address] [place] [address] Phone: [phone] Fax: [phone]  [facility] - [place], [place] - [address] [facility] [place] [address] [place] [address] [place] [address] Phone: [phone] Fax: [phone]     Social Drivers of Health (SDOH) Social History: SDOH Screenings   Food Insecurity: No Food Insecurity (01/19/2024)  Housing: High Risk (01/19/2024)  Transportation Needs: No Transportation Needs (01/19/2024)  Utilities: Not At Risk (01/19/2024)  Alcohol  Screen: Low Risk  (01/31/2021)  Depression (PHQ2-9): Low Risk  (  12/15/2022)  Financial Resource Strain: Low Risk  (01/31/2021)  Physical Activity: Insufficiently Active (09/30/2021)  Social Connections: Moderately Integrated (01/19/2024)  Stress: No Stress Concern Present (09/30/2021)  Tobacco Use: Medium Risk (01/18/2024)   SDOH Interventions:     Readmission Risk Interventions    01/19/2024   11:24 AM 11/14/2022     9:23 AM 10/01/2022    3:52 PM  Readmission Risk Prevention Plan  Transportation Screening Complete Complete Complete  PCP or Specialist Appt within 3-5 Days   Complete  Social Work Consult for Recovery Care Planning/Counseling   Complete  Palliative Care Screening   Not Applicable  Medication Review Oceanographer) Referral to Pharmacy Complete Referral to Pharmacy  PCP or Specialist appointment within 3-5 days of discharge  Complete   HRI or Home Care Consult Complete Complete   SW Recovery Care/Counseling Consult Complete Complete   Palliative Care Screening Not Applicable Not Applicable   Skilled Nursing Facility Not Applicable Not Applicable

## 2024-01-19 NOTE — Evaluation (Signed)
 Physical Therapy Evaluation Patient Details Name: Kayla [name] MRN: [id number] DOB: 07-Jul-1940 Today's Date: 01/19/2024  History of Present Illness  Pt is a 83 y.o. female presenting 9/16 with SOB. Work up showed bil pleural effusions, R>L and CAP. Admitted for COPD exacerbation. PMH: obesity, DM II, HTN, HLD, CAD, CHF, paroxysmal afib, S/p ICD, CKD, COPD 3L at baseline, hypothyroidism, chronic venous insufficiency, s/p left first ray amputation (09/2022).   Clinical Impression  Pt in bed upon arrival of PT, agreeable to evaluation at this time. Prior to admission the pt was ambulating with rollator in the home, RW into HD, and reports able to ambulate short distances without need for physical assistance. Pt presents today with SpO2 stable on 3L, and able to complete household distance of ambulation on 3L O2 with SpO2 >94%. Pt educated at length on progressive ambulation to improve endurance with gait, pt verbalized understanding. Would benefit from HHPT but pt declined stating she felt she could manage mobility progression without supervision. Will continue to follow acutely to progress activity tolerance as able but pt is ready to returnhome without further DME once medically stable.      If plan is discharge home, recommend the following: A little help with walking and/or transfers;Assistance with cooking/housework;Assist for transportation;Help with stairs or ramp for entrance   Can travel by private vehicle        Equipment Recommendations None recommended by PT  Recommendations for Other Services       Functional Status Assessment Patient has had a recent decline in their functional status and demonstrates the ability to make significant improvements in function in a reasonable and predictable amount of time.     Precautions / Restrictions Precautions Precautions: Fall Recall of Precautions/Restrictions: Intact Precaution/Restrictions Comments: on 3L O2 at  baseline Restrictions Weight Bearing Restrictions Per Provider Order: No      Mobility  Bed Mobility Overal bed mobility: Modified Independent             General bed mobility comments: No assist, HOB minimally elevated    Transfers Overall transfer level: Needs assistance Equipment used: None Transfers: Sit to/from Stand Sit to Stand: Supervision           General transfer comment: no UE support, slow guarded movement but stable. reaching for UE support for pivotal steps    Ambulation/Gait Ambulation/Gait assistance: Supervision Gait Distance (Feet): 45 Feet Assistive device: None, Rollator (4 wheels) Gait Pattern/deviations: Step-through pattern, Decreased stride length, Trunk flexed Gait velocity: decreased Gait velocity interpretation: <1.31 ft/sec, indicative of household ambulator   General Gait Details: mild trunk flexion, dependent on BUE support. increased SOB but SpO2 stable on 3L at 94%     Balance Overall balance assessment: Mild deficits observed, not formally tested                                           Pertinent Vitals/Pain Pain Assessment Pain Assessment: No/denies pain    Home Living Family/patient expects to be discharged to:: Private residence Living Arrangements: Spouse/significant other Available Help at Discharge: Family;Available 24 hours/day Type of Home: House Home Access: Ramped entrance       Home Layout: One level Home Equipment: Agricultural consultant (2 wheels);Cane - single point;Shower seat;Tub bench;Rollator (4 wheels) Additional Comments: 3L at baseline    Prior Function Prior Level of Function : Independent/Modified Independent  Mobility Comments: Pt uses rollator in home and in community, RW to walk into HD ADLs Comments: Mod I use of tub bench, rollator     Extremity/Trunk Assessment   Upper Extremity Assessment Upper Extremity Assessment: Defer to OT evaluation    Lower  Extremity Assessment Lower Extremity Assessment: Overall WFL for tasks assessed    Cervical / Trunk Assessment Cervical / Trunk Assessment: Normal  Communication   Communication Communication: No apparent difficulties    Cognition Arousal: Alert Behavior During Therapy: Agitated, WFL for tasks assessed/performed   PT - Cognitive impairments: Awareness, Problem solving                       PT - Cognition Comments: pt agreeable to discuss baseline and mobility, adamant that she is at her baseline without issues. eventually agreeable after getting a chance to eat. seems to be at baseline per family Following commands: Intact       Cueing Cueing Techniques: Verbal cues     General Comments General comments (skin integrity, edema, etc.): stable on 3L at rest 97-99%, maintained >94% on 3L with gait    Exercises Other Exercises Other Exercises: discussed progressive walking program and pt verbalized understanding   Assessment/Plan    PT Assessment Patient needs continued PT services  PT Problem List Cardiopulmonary status limiting activity;Decreased activity tolerance;Decreased mobility       PT Treatment Interventions DME instruction;Gait training;Functional mobility training;Therapeutic activities;Therapeutic exercise;Balance training;Patient/family education    PT Goals (Current goals can be found in the Care Plan section)  Acute Rehab PT Goals Patient Stated Goal: to return home asap PT Goal Formulation: With patient Time For Goal Achievement: 02/02/24 Potential to Achieve Goals: Good    Frequency Min 2X/week        AM-PAC PT 6 Clicks Mobility  Outcome Measure Help needed turning from your back to your side while in a flat bed without using bedrails?: None Help needed moving from lying on your back to sitting on the side of a flat bed without using bedrails?: None Help needed moving to and from a bed to a chair (including a wheelchair)?: A Little Help  needed standing up from a chair using your arms (e.g., wheelchair or bedside chair)?: A Little Help needed to walk in hospital room?: A Little Help needed climbing 3-5 steps with a railing? : A Little 6 Click Score: 20    End of Session Equipment Utilized During Treatment: Gait belt;Oxygen  Activity Tolerance: Patient tolerated treatment well Patient left: in bed;with call bell/phone within reach Nurse Communication: Mobility status PT Visit Diagnosis: Unsteadiness on feet (R26.81);Difficulty in walking, not elsewhere classified (R26.2)    Time: 1346-1405 (plus 1311-1317 (6 min)) PT Time Calculation (min) (ACUTE ONLY): 19 min   Charges:   PT Evaluation $PT Eval Low Complexity: 1 Low PT Treatments $Therapeutic Exercise: 8-22 mins PT General Charges $$ ACUTE PT VISIT: 1 Visit         Izetta Call, PT, DPT   Acute Rehabilitation Department Office [phone] Secure Chat Communication Preferred   Izetta JULIANNA Call 01/19/2024, 2:18 PM

## 2024-01-20 ENCOUNTER — Other Ambulatory Visit: Payer: Self-pay | Admitting: Cardiology

## 2024-01-20 ENCOUNTER — Other Ambulatory Visit (HOSPITAL_COMMUNITY): Payer: Self-pay

## 2024-01-20 DIAGNOSIS — I480 Paroxysmal atrial fibrillation: Secondary | ICD-10-CM

## 2024-01-20 LAB — GLUCOSE, CAPILLARY
Glucose-Capillary: 233 mg/dL — ABNORMAL HIGH (ref 70–99)
Glucose-Capillary: 176 mg/dL — ABNORMAL HIGH (ref 70–99)
Glucose-Capillary: 350 mg/dL — ABNORMAL HIGH (ref 70–99)
Glucose-Capillary: 369 mg/dL — ABNORMAL HIGH (ref 70–99)

## 2024-01-20 LAB — RENAL FUNCTION PANEL
Albumin: 2.9 g/dL — ABNORMAL LOW (ref 3.5–5.0)
Albumin: 3 g/dL — ABNORMAL LOW (ref 3.5–5.0)
Anion gap: 17 — ABNORMAL HIGH (ref 5–15)
Anion gap: 13 (ref 5–15)
BUN: 75 mg/dL — ABNORMAL HIGH (ref 8–23)
BUN: 86 mg/dL — ABNORMAL HIGH (ref 8–23)
CO2: 18 mmol/L — ABNORMAL LOW (ref 22–32)
CO2: 19 mmol/L — ABNORMAL LOW (ref 22–32)
Calcium: 9.3 mg/dL (ref 8.9–10.3)
Calcium: 8.9 mg/dL (ref 8.9–10.3)
Chloride: 100 mmol/L (ref 98–111)
Chloride: 100 mmol/L (ref 98–111)
Creatinine, Ser: 3.69 mg/dL — ABNORMAL HIGH (ref 0.44–1.00)
Creatinine, Ser: 4.09 mg/dL — ABNORMAL HIGH (ref 0.44–1.00)
GFR, Estimated: 12 mL/min — ABNORMAL LOW (ref >60)
GFR, Estimated: 10 mL/min — ABNORMAL LOW (ref >60)
Glucose, Bld: 228 mg/dL — ABNORMAL HIGH (ref 70–99)
Glucose, Bld: 403 mg/dL — ABNORMAL HIGH (ref 70–99)
Phosphorus: 4.9 mg/dL — ABNORMAL HIGH (ref 2.5–4.6)
Phosphorus: 4.6 mg/dL (ref 2.5–4.6)
Potassium: 5.6 mmol/L — ABNORMAL HIGH (ref 3.5–5.1)
Potassium: 4.7 mmol/L (ref 3.5–5.1)
Sodium: 135 mmol/L (ref 135–145)
Sodium: 132 mmol/L — ABNORMAL LOW (ref 135–145)

## 2024-01-20 LAB — CBC
HCT: 34.7 % — ABNORMAL LOW (ref 36.0–46.0)
Hemoglobin: 10.5 g/dL — ABNORMAL LOW (ref 12.0–15.0)
MCH: 29.2 pg (ref 26.0–34.0)
MCHC: 30.3 g/dL (ref 30.0–36.0)
MCV: 96.4 fL (ref 80.0–100.0)
Platelets: 265 K/uL (ref 150–400)
RBC: 3.6 MIL/uL — ABNORMAL LOW (ref 3.87–5.11)
RDW: 16.3 % — ABNORMAL HIGH (ref 11.5–15.5)
WBC: 12.8 K/uL — ABNORMAL HIGH (ref 4.0–10.5)
nRBC: 0 % (ref 0.0–0.2)

## 2024-01-20 LAB — MRSA NEXT GEN BY PCR, NASAL: MRSA by PCR Next Gen: NOT DETECTED

## 2024-01-20 MED ORDER — BREZTRI AEROSPHERE 160-9-4.8 MCG/ACT IN AERO
2.0000 | INHALATION_SPRAY | Freq: Two times a day (BID) | RESPIRATORY_TRACT | 0 refills | Status: AC
  Filled 2024-01-20: qty 10.7, 30d supply, fill #0

## 2024-01-20 MED ORDER — PREDNISONE 20 MG PO TABS: 40.0000 mg | ORAL_TABLET | Freq: Every day | ORAL | Status: DC

## 2024-01-20 MED ORDER — CHLORHEXIDINE GLUCONATE CLOTH 2 % EX PADS
6.0000 | MEDICATED_PAD | Freq: Every day | CUTANEOUS | Status: DC
  Administered 2024-01-20: 6 via TOPICAL

## 2024-01-20 MED ORDER — APIXABAN 2.5 MG PO TABS: 2.5000 mg | ORAL_TABLET | Freq: Two times a day (BID) | ORAL | Status: DC

## 2024-01-20 MED ORDER — SODIUM ZIRCONIUM CYCLOSILICATE 10 G PO PACK
10.0000 g | PACK | ORAL | Status: AC
  Administered 2024-01-20 (×2): 10 g via ORAL
  Filled 2024-01-20 (×2): qty 1

## 2024-01-20 MED ORDER — PREDNISONE 20 MG PO TABS
40.0000 mg | ORAL_TABLET | Freq: Every day | ORAL | 0 refills | Status: DC
  Filled 2024-01-20: qty 4, 2d supply, fill #0

## 2024-01-20 MED ORDER — AZITHROMYCIN 500 MG PO TABS
500.0000 mg | ORAL_TABLET | Freq: Every day | ORAL | 0 refills | Status: AC
  Filled 2024-01-20: qty 4, 4d supply, fill #0

## 2024-01-20 NOTE — Discharge Summary (Addendum)
 Physician Discharge Summary   Patient: Kayla [name] MRN: [id number] DOB: [birth year]  Admit date:     01/18/2024  Discharge date: 01/20/24  Discharge Physician: Elidia Sieving Camerin Ladouceur   PCP: Stephanie Charlene CROME, MD   Recommendations at discharge:    Patient will continue prednisone  40 mg po daily and will continue with airway clearing techniques with flutter valve and incentive spirometer.  Macrolide antibiotic to complete 5 days.  Follow up with HD unit tomorrow Follow up with Vascular surgery, for fistulogram.  Follow up with Dr Stephanie in 7 to 10 days Follow up with Nephrology as scheduled.   Discharge Diagnoses: Principal Problem:   COPD with acute exacerbation (HCC) Active Problems:   Essential hypertension   Paroxysmal atrial fibrillation (HCC)   Chronic diastolic heart failure (HCC)   ESRD (end stage renal disease) (HCC)   Hyperlipidemia associated with type 2 diabetes mellitus (HCC)   HYPOTHYROIDISM, POSTSURGICAL  Resolved Problems:   * No resolved hospital problems. [facility] Course: Kayla [name] was admitted to the hospital with the working diagnosis of COPD exacerbation.   83 yo female with the past medical history of COPD, chronic pain syndrome,T2DM, ESRD on HD, atrial fibrillation and history of MRSA bacteremia.  Reported worsening dyspnea for several weeks, along with chest pain. Because acute worsening of her symptoms EMS was called, she was found in respiratory distress, she was placed on Cpap and transported to the ED.  On her initial physical examination her blood pressure was 187/62, HR 81 RR 22 and 02 saturation 98%  Lungs with prolonged expiratory phase and bilateral rhonchi, heart with S1 and S2 present and regular, abdomen with no distention and trace lower extremity edema.   VBG 7.39/ 33.9/ 36/ 20/ 69%  NA 136, K 4.7 Cl 105 bicarbonate 19, glucose 262, bun 29 cr 2,44  AST 18 ALT 9  Wbc 14.2 hgb 11.2 plt 342   Chest radiograph with hyperinflation,  positive cardiomegaly, with bilateral hilar vascular congestion, bilateral interstitial infiltrates, more at lower zones.  HD catheter in the internal jugular vein.   EKG 74 bpm, normal axis, normal intervals, qtc 468, junctional rhythm with no significant ST segment or T wave changes.   Patient was placed on systemic and inhaled corticosteroids. Bronchodilator therapy.  09/18 respiratory status has improved, patient anxious to go home.   Assessment and Plan: * COPD with acute exacerbation (HCC) Symptoms have improved, she back to baseline supplemental 02 per [place] at 3 L/min with 02 saturation 97%   Plan to continue bronchodilator therapy and inhaled corticosteroids Airway clearing techniques with flutter valve and incentive spirometer.  Complete 5 days of macrolide antibiotic therapy.  Prednisone  oral 40 mg for 5 days more.   Essential hypertension Continue blood pressure control with amlodipine  and isosorbide .   Paroxysmal atrial fibrillation (HCC) Pacemaker lead extracted due to MRSA bacteremia in the past.  Sick sinu syndrome with bradycardia.   Resume anticoagulation with apixaban  2.5 mg po bid.  ESRD (end stage renal disease) (HCC) HD on Monday and Friday.  Plan for fistulogram on Friday, but patient would like to do as outpatient.   Chronic diastolic heart failure (HCC) Echocardiogram with preserved LV systolic function with Ef 55 to 60%, Mild LVH, diastolic dysfunction grade II with pseudo normalization EA, RV normal systolic function, mild MR, moderate to severe tricuspid regurgitation, mild aortic valve stenosis, RVSP 53.9 mmHg   Continue diuretic therapy with torsemide  on non HD days Limited medical therapy due to reduced  GFR.   Hyperlipidemia associated with type 2 diabetes mellitus (HCC) Patient was placed on insulin  sliding scale for glucose cover and monitoring  Basal insulin .    HYPOTHYROIDISM, POSTSURGICAL Continue with levothyroxine        Consultants:  none  Procedures performed: none   Disposition: Home Diet recommendation:  Cardiac diet DISCHARGE MEDICATION: Allergies as of 01/20/2024       Reactions   Oxycodone  Itching, Other (See Comments)   Pt still takes this med even thought it causes itching   Trelegy Ellipta  [fluticasone -umeclidin-vilant] Nausea And Vomiting, Other (See Comments), Cough   Made the patient sick to her stomach (only) several days after using it    Trulicity [dulaglutide] Nausea And Vomiting, Nausea Only, Other (See Comments), Cough   TRULICITY made the patient sick to her stomach (only) several days after using it        Medication List     STOP taking these medications    apixaban  2.5 MG Tabs tablet Commonly known as: Eliquis    fluticasone  50 MCG/ACT nasal spray Commonly known as: FLONASE        TAKE these medications    acetaminophen  500 MG tablet Commonly known as: TYLENOL  Take 1,000-1,500 mg by mouth every 8 (eight) hours as needed for mild pain, moderate pain or headache.   albuterol  108 (90 Base) MCG/ACT inhaler Commonly known as: VENTOLIN  HFA Inhale 2 puffs into the lungs every 6 (six) hours as needed for wheezing or shortness of breath.   albuterol  (2.5 MG/3ML) 0.083% nebulizer solution Commonly known as: PROVENTIL  Take 3 mLs (2.5 mg total) by nebulization every 2 (two) hours as needed for wheezing or shortness of breath.   amLODipine  2.5 MG tablet Commonly known as: NORVASC  Take 2.5 mg by mouth every morning. What changed: Another medication with the same name was changed. Make sure you understand how and when to take each.   amLODipine  5 MG tablet Commonly known as: NORVASC  Take 1 tablet (5 mg total) by mouth daily. What changed: when to take this   ARTIFICIAL TEARS OP Place 1 drop into both eyes daily as needed (dry eyes).   ascorbic acid  1000 MG tablet Commonly known as: VITAMIN C  Take 1,000 mg by mouth daily with lunch.   atorvastatin  40 MG tablet Commonly known as:  LIPITOR Take 1 tablet (40 mg total) by mouth daily.   azithromycin  500 MG tablet Commonly known as: ZITHROMAX  Take 1 tablet (500 mg total) by mouth daily for 4 days.   B-D SINGLE USE SWABS  REGULAR Pads   benzonatate  100 MG capsule Commonly known as: TESSALON  Take 1 capsule (100 mg total) by mouth 3 (three) times daily.   Breztri  Aerosphere 160-9-4.8 MCG/ACT Aero inhaler Generic drug: budesonide -glycopyrrolate -formoterol  Inhale 2 puffs into the lungs in the morning and at bedtime.   Coricidin HBP Cold/Cough/Flu 650-20 MG/30ML Liqd Generic drug: Acetaminophen -DM Take 30 mLs by mouth daily as needed (for cough).   cyanocobalamin  1000 MCG tablet Commonly known as: VITAMIN B12 Take 1,000 mcg by mouth daily with lunch.   escitalopram  10 MG tablet Commonly known as: LEXAPRO  Take 10 mg by mouth daily.   escitalopram  5 MG tablet Commonly known as: LEXAPRO  Take 1 tablet (5 mg total) by mouth daily.   ferrous sulfate  325 (65 FE) MG tablet Take 1 tablet (325 mg total) by mouth daily with breakfast.   gabapentin  800 MG tablet Commonly known as: NEURONTIN  Take 800 mg by mouth in the morning, at noon, in the evening, and at bedtime.  HYDROcodone -acetaminophen  7.5-325 MG tablet Commonly known as: NORCO Take 1 tablet by mouth every 6 (six) hours as needed for severe pain (pain score 7-10).   insulin  aspart 100 UNIT/ML FlexPen Commonly known as: NOVOLOG  Inject 0-20 Units into the skin See admin instructions. Inject 0-20 units into the skin three times a day before meals as needed, per SLIDING SCALE   isosorbide  mononitrate 60 MG 24 hr tablet Commonly known as: IMDUR  Take 1 tablet (60 mg total) by mouth daily.   Lantus  100 UNIT/ML injection Generic drug: insulin  glargine Inject 45 Units into the skin every morning.   levothyroxine  200 MCG tablet Commonly known as: SYNTHROID  Take 100-200 mcg by mouth See admin instructions. Take 1/2 tablet by mouth every Monday and Friday,  then take 1 tablet all other days   nitroGLYCERIN  0.4 MG SL tablet Commonly known as: NITROSTAT  Place 1 tablet (0.4 mg total) under the tongue every 5 (five) minutes as needed for chest pain.   omeprazole  20 MG tablet Commonly known as: PRILOSEC OTC Take 20 mg by mouth daily.   predniSONE  20 MG tablet Commonly known as: DELTASONE  Take 2 tablets (40 mg total) by mouth daily with breakfast. Start taking on: January 21, 2024   PRESCRIPTION MEDICATION CPAP- At bedtime   sevelamer  carbonate 800 MG tablet Commonly known as: RENVELA  Take 1 tablet (800 mg total) by mouth 3 (three) times daily with meals.   torsemide  100 MG tablet Commonly known as: DEMADEX  Take 1 tablet (100 mg total) by mouth 2 (two) times daily. What changed:  when to take this additional instructions   True Metrix Blood Glucose Test test strip Generic drug: glucose blood   True Metrix Level 1 Low Soln   TRUEplus Lancets 28G Misc   vitamin D3 25 MCG tablet Commonly known as: CHOLECALCIFEROL  Take 1,000 Units by mouth daily with lunch.        Follow-up Information     [facility] Follow up.   Specialty: Home Health Services Why: Registered Nurse-office to call with visit times. Contact information: [address] [place]  [phone]                Discharge Exam: Fredricka Weights   01/19/24 9787  Weight: 85.8 kg   BP (!) 183/93   Pulse 86   Temp 97.8 F (36.6 C) (Oral)   Resp 20   Ht 5' 5 (1.651 m)   Wt 85.8 kg   SpO2 97%   BMI 31.47 kg/m   Patient with no chest pain, dyspnea has improved, no nausea or vomiting  Neurology awake and alert ENT with mild pallor with no icterus Cardiovascular with S1 and S2 present and regular with no gallops, rubs or murmurs No JVD  Respiratory with prolonged expiratory phase with no rhonchi and no rales Abdomen with no distention  Trace lower extremity edema    Condition at discharge: stable  The results  of significant diagnostics from this hospitalization (including imaging, microbiology, ancillary and laboratory) are listed below for reference.   Imaging Studies: ECHOCARDIOGRAM COMPLETE Result Date: 01/19/2024    ECHOCARDIOGRAM REPORT   Patient Name:   MAYURI STAPLES Date of Exam: 01/19/2024 Medical Rec #:  [id number]        Height:       65.0 in Accession #:    [id number]       Weight:       189.1 lb Date of Birth:  [birth year]  BSA:          1.932 m Patient Age:    82 years         BP:           165/62 mmHg Patient Gender: F                HR:           59 bpm. Exam Location:  Inpatient Procedure: 2D Echo, Cardiac Doppler and Color Doppler (Both Spectral and Color            Flow Doppler were utilized during procedure). Indications:    Dyspnea  History:        Patient has prior history of Echocardiogram examinations, most                 recent 10/25/2022. COPD; Risk Factors:Dyslipidemia, Diabetes,                 Hypertension and Sleep Apnea. Ckd stage 3.  Sonographer:    Philomena Daring Referring Phys: [id number] ANASTASSIA DOUTOVA IMPRESSIONS  1. Left ventricular ejection fraction, by estimation, is 55 to 60%. The left ventricle has normal function. The left ventricle has no regional wall motion abnormalities. There is mild concentric left ventricular hypertrophy. Left ventricular diastolic parameters are consistent with Grade II diastolic dysfunction (pseudonormalization). Elevated left ventricular end-diastolic pressure.  2. Right ventricular systolic function is normal. The right ventricular size is normal. There is moderately elevated pulmonary artery systolic pressure.  3. The mitral valve is normal in structure. Mild mitral valve regurgitation. No evidence of mitral stenosis.  4. Tricuspid valve regurgitation is moderate to severe.  5. The aortic valve is normal in structure. There is mild calcification of the aortic valve. There is mild thickening of the aortic valve. Aortic valve regurgitation is not  visualized. Mild aortic valve stenosis. Aortic valve area, by VTI measures 2.68 cm. Aortic valve mean gradient measures 11.0 mmHg. Aortic valve Vmax measures 2.21 m/s.  6. The inferior vena cava is normal in size with greater than 50% respiratory variability, suggesting right atrial pressure of 3 mmHg. FINDINGS  Left Ventricle: Left ventricular ejection fraction, by estimation, is 55 to 60%. The left ventricle has normal function. The left ventricle has no regional wall motion abnormalities. The left ventricular internal cavity size was normal in size. There is  mild concentric left ventricular hypertrophy. Left ventricular diastolic parameters are consistent with Grade II diastolic dysfunction (pseudonormalization). Elevated left ventricular end-diastolic pressure. Right Ventricle: The right ventricular size is normal. No increase in right ventricular wall thickness. Right ventricular systolic function is normal. There is moderately elevated pulmonary artery systolic pressure. The tricuspid regurgitant velocity is 3.12 m/s, and with an assumed right atrial pressure of 15 mmHg, the estimated right ventricular systolic pressure is 53.9 mmHg. Left Atrium: Left atrial size was normal in size. Right Atrium: Right atrial size was normal in size. Pericardium: There is no evidence of pericardial effusion. Mitral Valve: The mitral valve is normal in structure. Mild mitral valve regurgitation. No evidence of mitral valve stenosis. Tricuspid Valve: The tricuspid valve is normal in structure. Tricuspid valve regurgitation is moderate to severe. No evidence of tricuspid stenosis. Aortic Valve: The aortic valve is normal in structure. There is mild calcification of the aortic valve. There is mild thickening of the aortic valve. Aortic valve regurgitation is not visualized. Mild aortic stenosis is present. Aortic valve mean gradient measures 11.0 mmHg. Aortic valve peak gradient measures 19.5 mmHg. Aortic  valve area, by VTI  measures 2.68 cm. Pulmonic Valve: The pulmonic valve was normal in structure. Pulmonic valve regurgitation is not visualized. No evidence of pulmonic stenosis. Aorta: The aortic root is normal in size and structure. Venous: The inferior vena cava is normal in size with greater than 50% respiratory variability, suggesting right atrial pressure of 3 mmHg. IAS/Shunts: No atrial level shunt detected by color flow Doppler.  LEFT VENTRICLE PLAX 2D LVIDd:         5.30 cm   Diastology LVIDs:         3.60 cm   LV e' medial:    6.96 cm/s LV PW:         1.30 cm   LV E/e' medial:  22.0 LV IVS:        1.30 cm   LV e' lateral:   9.57 cm/s LVOT diam:     2.10 cm   LV E/e' lateral: 16.0 LV SV:         134 LV SV Index:   70 LVOT Area:     3.46 cm  RIGHT VENTRICLE             IVC RV S prime:     12.20 cm/s  IVC diam: 2.50 cm TAPSE (M-mode): 2.2 cm LEFT ATRIUM             Index        RIGHT ATRIUM           Index LA diam:        4.00 cm 2.07 cm/m   RA Area:     18.20 cm LA Vol (A2C):   44.3 ml 22.93 ml/m  RA Volume:   50.40 ml  26.09 ml/m LA Vol (A4C):   46.4 ml 24.02 ml/m LA Biplane Vol: 46.7 ml 24.18 ml/m  AORTIC VALVE AV Area (Vmax):    2.92 cm AV Area (Vmean):   2.79 cm AV Area (VTI):     2.68 cm AV Vmax:           221.00 cm/s AV Vmean:          157.500 cm/s AV VTI:            0.502 m AV Peak Grad:      19.5 mmHg AV Mean Grad:      11.0 mmHg LVOT Vmax:         186.00 cm/s LVOT Vmean:        127.000 cm/s LVOT VTI:          0.388 m LVOT/AV VTI ratio: 0.77  AORTA Ao Root diam: 2.60 cm Ao Asc diam:  3.20 cm MITRAL VALVE                TRICUSPID VALVE MV Area (PHT): 4.39 cm     TR Peak grad:   38.9 mmHg MV Decel Time: 173 msec     TR Vmax:        312.00 cm/s MV E velocity: 153.00 cm/s MV A velocity: 95.60 cm/s   SHUNTS MV E/A ratio:  1.60         Systemic VTI:  0.39 m                             Systemic Diam: 2.10 cm Annabella Scarce MD Electronically signed by Annabella Scarce MD Signature Date/Time: 01/19/2024/2:25:13 PM     Final    IR US  CHEST Result  Date: 01/19/2024 CLINICAL DATA:  142230 Pleural effusion 142230 Limited ultrasound of the chest done prior to possible thoracentesis. EXAM: LIMITED RIGHT CHEST ULTRASOUND COMPARISON:  CT chest, 01/18/2024.  Chest XR, 01/18/2019. FINDINGS: There is only a small pleural effusion present with no window to allow for safe approach for thoracentesis. IMPRESSION: Small volume RIGHT pleural effusion. No window to allow for safe thoracentesis. Procedure was NOT performed Ultrasound performed by: Sherrilee Bal, PA-C under direct supervision of Thom Hall, MD Electronically Signed   By: Thom Hall M.D.   On: 01/19/2024 10:35   CT CHEST WO CONTRAST Result Date: 01/18/2024 CLINICAL DATA:  Shortness of breath EXAM: CT CHEST WITHOUT CONTRAST TECHNIQUE: Multidetector CT imaging of the chest was performed following the standard protocol without IV contrast. RADIATION DOSE REDUCTION: This exam was performed according to the departmental dose-optimization program which includes automated exposure control, adjustment of the mA and/or kV according to patient size and/or use of iterative reconstruction technique. COMPARISON:  Chest x-ray from earlier in the same day. FINDINGS: Cardiovascular: Somewhat limited due to lack of IV contrast. Right jugular dialysis catheter is noted. Atherosclerotic calcifications of the aorta are seen. No aneurysmal dilatation is noted. The heart is not significantly enlarged in size. Heavy coronary calcifications are noted. Mediastinum/Nodes: Thoracic inlet is within normal limits. No hilar or mediastinal adenopathy is noted. The esophagus as visualized is within normal limits. Lungs/Pleura: Small effusion is noted on the left. Minimal left basilar atelectasis is noted. No focal infiltrate is seen. Diffuse emphysematous changes are noted. Moderate to large right-sided pleural effusion is noted. Lower lobe consolidation is noted greater than that seen on the left. No  parenchymal nodules are noted. Upper Abdomen: Visualized upper abdomen is within normal limits. Musculoskeletal: Degenerative changes of the thoracic spine are seen. No acute rib abnormality is noted. Postsurgical changes in the cervical spine are noted. IMPRESSION: Bilateral pleural effusions are right considerably greater than left with lower lobe consolidation. No other acute abnormality is noted. Aortic Atherosclerosis (ICD10-I70.0) and Emphysema (ICD10-J43.9). Electronically Signed   By: Oneil Devonshire M.D.   On: 01/18/2024 23:02   DG Chest Portable 1 View Result Date: 01/18/2024 CLINICAL DATA:  Cough and shortness of breath. EXAM: PORTABLE CHEST 1 VIEW COMPARISON:  08/29/2023 FINDINGS: More prominent heart size. Title dialysis catheter remains in place with the catheter tip in the lower SVC. Chronic pulmonary venous hypertension and interstitial prominence without overt airspace edema or significant pleural effusions. Small amount of bilateral pleural fluid cannot be excluded. No pneumothorax. IMPRESSION: Chronic pulmonary venous hypertension and interstitial prominence without overt airspace edema or significant pleural effusions. Small amount of bilateral pleural fluid cannot be excluded. Electronically Signed   By: Marcey Moan M.D.   On: 01/18/2024 18:07   CT HEAD WO CONTRAST ( ) Result Date: 12/31/2023 EXAM: CT HEAD WITHOUT CONTRAST 12/31/2023 04:16:23 PM TECHNIQUE: CT of the head was performed without the administration of intravenous contrast. Automated exposure control, iterative reconstruction, and/or weight based adjustment of the mA/kV was utilized to reduce the radiation dose to as low as reasonably achievable. COMPARISON: 01/14/2022 CLINICAL HISTORY: H/a x 2-3 weeks; Recent frontal head injury (hit head on dresser); No sx on head; No cancer FINDINGS: BRAIN AND VENTRICLES: No acute hemorrhage. No hydrocephalus. No extra-axial collection. No mass effect or midline shift. 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 Loss of gray-white differentiation along the right occipital lobe seen on axial image 12 series  2. This may represent sequelae of prior contusion or infarct. ORBITS: No acute abnormality. SINUSES: No acute abnormality. SOFT TISSUES AND SKULL: No acute soft tissue abnormality. No skull fracture. IMPRESSION: 1. No acute intracranial hemorrhage. 2. Loss of gray-white differentiation along the right occipital lobe, possibly representing recent infarct or contusion. Correlate for left visual field symptoms and consider MRI of the brain without contrast for further characterization. 3. Stable background of mild chronic small vessel disease with old lacunar infarct in the right cerebellar hemisphere. Electronically signed by: Ryan Chess MD 12/31/2023 04:26 PM EDT RP Workstation: [id number]    Microbiology: Results for orders placed or performed during the hospital encounter of 01/18/24  Respiratory (~20 pathogens) panel by PCR     Status: None   Collection Time: 01/18/24  7:42 PM   Specimen: Nasopharyngeal Swab; Respiratory  Result Value Ref Range Status   Adenovirus NOT DETECTED NOT DETECTED Final   Coronavirus 229E NOT DETECTED NOT DETECTED Final    Comment: (NOTE) The Coronavirus on the Respiratory Panel, DOES NOT test for the novel  Coronavirus (2019 nCoV)    Coronavirus HKU1 NOT DETECTED NOT DETECTED Final   Coronavirus NL63 NOT DETECTED NOT DETECTED Final   Coronavirus OC43 NOT DETECTED NOT DETECTED Final   Metapneumovirus NOT DETECTED NOT DETECTED Final   Rhinovirus / Enterovirus NOT DETECTED NOT DETECTED Final   Influenza A NOT DETECTED NOT DETECTED Final   Influenza B NOT DETECTED NOT DETECTED Final   Parainfluenza Virus 1 NOT DETECTED NOT DETECTED Final   Parainfluenza Virus 2 NOT DETECTED NOT DETECTED Final   Parainfluenza Virus 3 NOT DETECTED NOT DETECTED Final   Parainfluenza Virus 4  NOT DETECTED NOT DETECTED Final   Respiratory Syncytial Virus NOT DETECTED NOT DETECTED Final   Bordetella pertussis NOT DETECTED NOT DETECTED Final   Bordetella Parapertussis NOT DETECTED NOT DETECTED Final   Chlamydophila pneumoniae NOT DETECTED NOT DETECTED Final   Mycoplasma pneumoniae NOT DETECTED NOT DETECTED Final    Comment: Performed at [facility], [address] [place], [place] [address]  Culture, blood (routine x 2) Call MD if unable to obtain prior to antibiotics being given     Status: None (Preliminary result)   Collection Time: 01/19/24  5:41 AM   Specimen: BLOOD  Result Value Ref Range Status   Specimen Description BLOOD SITE NOT SPECIFIED  Final   Special Requests   Final    BOTTLES DRAWN AEROBIC AND ANAEROBIC Blood Culture adequate volume   Culture   Final    NO GROWTH 1 DAY Performed at [facility], [address], [place], [place] [address]    Report Status PENDING  Incomplete  Culture, blood (routine x 2) Call MD if unable to obtain prior to antibiotics being given     Status: None (Preliminary result)   Collection Time: 01/19/24  5:59 AM   Specimen: BLOOD  Result Value Ref Range Status   Specimen Description BLOOD SITE NOT SPECIFIED  Final   Special Requests   Final    BOTTLES DRAWN AEROBIC AND ANAEROBIC Blood Culture adequate volume   Culture   Final    NO GROWTH 1 DAY Performed at [facility], [address], [place], [place] [address]    Report Status PENDING  Incomplete  MRSA Next Gen by PCR, Nasal     Status: None   Collection Time: 01/20/24 10:55 AM   Specimen: Nasal Mucosa; Nasal Swab  Result Value Ref Range Status  MRSA by PCR Next Gen NOT DETECTED NOT DETECTED Final    Comment: (NOTE) The GeneXpert MRSA Assay (FDA approved for NASAL specimens only), is one component of a comprehensive MRSA colonization surveillance program. It is not intended to diagnose MRSA infection nor to guide or monitor treatment for MRSA  infections. Test performance is not FDA approved in patients less than 29 years old. Performed at [facility], [address], [place], [place] [address]    *Note: Due to a large number of results and/or encounters for the requested time period, some results have not been displayed. A complete set of results can be found in Results Review.    Labs: CBC: Recent Labs  Lab 01/18/24 1622 01/18/24 1628 01/19/24 0541 01/20/24 0458  WBC 14.2*  --  11.3* 12.8*  NEUTROABS 12.3*  --   --   --   HGB 11.2* 12.2 11.0* 10.5*  HCT 38.0 36.0 36.7 34.7*  MCV 100.0  --  96.6 96.4  PLT 342  --  366 265   Basic Metabolic Panel: Recent Labs  Lab 01/18/24 1622 01/18/24 1628 01/19/24 0541 01/20/24 0458  NA 136 139  --  135  K 4.7 4.7  --  5.6*  CL 105  --   --  100  CO2 19*  --   --  18*  GLUCOSE 262*  --   --  228*  BUN 29*  --   --  75*  CREATININE 2.44*  --   --  3.69*  CALCIUM  9.0  --   --  9.3  MG  --   --  1.9  --   PHOS  --   --  3.4 4.9*   Liver Function Tests: Recent Labs  Lab 01/18/24 1622 01/20/24 0458  AST 18  --   ALT 9  --   ALKPHOS 72  --   BILITOT 0.6  --   PROT 6.7  --   ALBUMIN 3.1* 2.9*   CBG: Recent Labs  Lab 01/19/24 2030 01/19/24 2334 01/20/24 0340 01/20/24 0801 01/20/24 1137  GLUCAP 287* 299* 233* 176* 350*    Discharge time spent: greater than 30 minutes.  Signed: Elidia Toribio Furnace, MD [facility] 01/20/2024

## 2024-01-20 NOTE — Plan of Care (Signed)
  Problem: Coping: Goal: Ability to adjust to condition or change in health will improve Outcome: Progressing   Problem: Fluid Volume: Goal: Ability to maintain a balanced intake and output will improve Outcome: Progressing   Problem: Health Behavior/Discharge Planning: Goal: Ability to identify and utilize available resources and services will improve Outcome: Progressing   Problem: Metabolic: Goal: Ability to maintain appropriate glucose levels will improve Outcome: Progressing   

## 2024-01-20 NOTE — Progress Notes (Addendum)
 Discharge  Pt and spouse verbally understands discharge instruction.  PIV and tele removed by Nursing student with instructor at bedside.    Right chest porta cath dressing intact.  Per Dr. Elidia Furnace, ok for patient to discharge with elevated BGL.  Bgl is decreasing.  Patient alert oriented and in good spirits. Patient wearing her own portable Ox.  No distress observed.    Spouse at bedside.   Transferred to TOC and MAIN A. By RN students.   Lounge notified.

## 2024-01-20 NOTE — Progress Notes (Signed)
 Chaplain responded to spiritual care consult. Husband James bedside.   Pt Kayla [name] welcomed me into the room, and she and her husband both shared their thoughts and emotions surrounding the current hospitalization, along with family history. They have been married for 65 years in November with many grandchildren and great grandchildren, and are supporting each other well. She is hopeful to be discharged today. They also welcomed prayer, which I happily provided.   Chaplains remain available as further needs arise.

## 2024-01-20 NOTE — Inpatient Diabetes Management (Signed)
 Inpatient Diabetes Program Recommendations  AACE/ADA: New Consensus Statement on Inpatient Glycemic Control (2015)  Target Ranges:  Prepandial:   less than 140 mg/dL      Peak postprandial:   less than 180 mg/dL (1-2 hours)      Critically ill patients:  140 - 180 mg/dL   Lab Results  Component Value Date   GLUCAP 176 (H) 01/20/2024   HGBA1C 6.1 (H) 01/19/2024    Review of Glycemic Control  Latest Reference Range & Units 01/19/24 07:54 01/19/24 12:11 01/19/24 16:02 01/19/24 20:30 01/19/24 23:34 01/20/24 03:40 01/20/24 08:01  Glucose-Capillary 70 - 99 mg/dL 813 (H) 711 (H) 659 (H) 287 (H) 299 (H) 233 (H) 176 (H)   Diabetes history: DM 2 Outpatient Diabetes medications: Novolog  0-20 units tid, Lantus  45 units Daily Current orders for Inpatient glycemic control:  Lantus  25 units Daily Novolog  0-9 units Q 4 hours Solumedrol 40 mg Q 12 hours  Inpatient Diabetes Program Recommendations:    NOTE: Glucose 176 this am, pt received Lantus  50 units yesterday. Currently is ordered 25 units for today. Glucose trends increase after steroid dose and  PO intake.  -   Please consider increasing Lantus  back up to 50 units -   Add Novolog  2 units tid meal coverage if eating >50% of meals  Thanks,  Clotilda Bull RN, MSN, BC-ADM Inpatient Diabetes Coordinator Team Pager [phone] (8a-5p)

## 2024-01-21 ENCOUNTER — Telehealth: Payer: Self-pay | Admitting: Nephrology

## 2024-01-21 DIAGNOSIS — D631 Anemia in chronic kidney disease: Secondary | ICD-10-CM | POA: Diagnosis not present

## 2024-01-21 DIAGNOSIS — T8249XD Other complication of vascular dialysis catheter, subsequent encounter: Secondary | ICD-10-CM | POA: Diagnosis not present

## 2024-01-21 DIAGNOSIS — N186 End stage renal disease: Secondary | ICD-10-CM | POA: Diagnosis not present

## 2024-01-21 DIAGNOSIS — Z992 Dependence on renal dialysis: Secondary | ICD-10-CM | POA: Diagnosis not present

## 2024-01-21 DIAGNOSIS — N2581 Secondary hyperparathyroidism of renal origin: Secondary | ICD-10-CM | POA: Diagnosis not present

## 2024-01-21 SURGERY — A/V FISTULAGRAM
Anesthesia: LOCAL

## 2024-01-21 NOTE — Telephone Encounter (Signed)
 Transition of care contact from inpatient facility  Date of Discharge: 01/20/24 Date of Contact: 01/21/24 Method of contact: Phone  Attempted to contact patient to discuss transition of care from inpatient admission. Patient did not answer the phone. Will continue outreach attempts.

## 2024-01-21 NOTE — Telephone Encounter (Signed)
 Prescription refill request for Eliquis  received. Indication:afib Last office visit:1/25 Scr:3.69  9/25 Age: 83 Weight:85.8  kg  Prescription refilled

## 2024-01-24 DIAGNOSIS — N186 End stage renal disease: Secondary | ICD-10-CM | POA: Diagnosis not present

## 2024-01-24 DIAGNOSIS — D631 Anemia in chronic kidney disease: Secondary | ICD-10-CM | POA: Diagnosis not present

## 2024-01-24 DIAGNOSIS — N2581 Secondary hyperparathyroidism of renal origin: Secondary | ICD-10-CM | POA: Diagnosis not present

## 2024-01-24 DIAGNOSIS — T8249XD Other complication of vascular dialysis catheter, subsequent encounter: Secondary | ICD-10-CM | POA: Diagnosis not present

## 2024-01-24 DIAGNOSIS — Z992 Dependence on renal dialysis: Secondary | ICD-10-CM | POA: Diagnosis not present

## 2024-01-24 LAB — CULTURE, BLOOD (ROUTINE X 2)
Culture: NO GROWTH
Culture: NO GROWTH
Special Requests: ADEQUATE
Special Requests: ADEQUATE

## 2024-01-26 ENCOUNTER — Other Ambulatory Visit: Payer: Self-pay

## 2024-01-26 ENCOUNTER — Encounter (HOSPITAL_COMMUNITY)
Admission: RE | Disposition: A | Discharge status: Home / Self Care | Payer: Self-pay | Source: Home / Self Care | Attending: Surgery

## 2024-01-26 ENCOUNTER — Encounter (HOSPITAL_COMMUNITY): Payer: Self-pay | Admitting: Surgery

## 2024-01-26 ENCOUNTER — Ambulatory Visit (HOSPITAL_COMMUNITY)
Admission: RE | Admit: 2024-01-26 | Discharge: 2024-01-26 | Disposition: A | Discharge status: Home / Self Care | Attending: Surgery | Admitting: Surgery

## 2024-01-26 DIAGNOSIS — Z992 Dependence on renal dialysis: Secondary | ICD-10-CM

## 2024-01-26 DIAGNOSIS — N186 End stage renal disease: Secondary | ICD-10-CM | POA: Insufficient documentation

## 2024-01-26 DIAGNOSIS — T82590A Other mechanical complication of surgically created arteriovenous fistula, initial encounter: Secondary | ICD-10-CM | POA: Diagnosis not present

## 2024-01-26 HISTORY — PX: A/V FISTULAGRAM: CATH118298

## 2024-01-26 LAB — GLUCOSE, CAPILLARY: Glucose-Capillary: 189 mg/dL — ABNORMAL HIGH (ref 70–99)

## 2024-01-26 SURGERY — A/V FISTULAGRAM
Anesthesia: LOCAL

## 2024-01-26 MED ORDER — LIDOCAINE HCL (PF) 1 % IJ SOLN
INTRAMUSCULAR | Status: DC | PRN
  Administered 2024-01-26: 5 mL

## 2024-01-26 MED ORDER — HEPARIN (PORCINE) IN NACL 1000-0.9 UT/500ML-% IV SOLN
INTRAVENOUS | Status: DC | PRN
  Administered 2024-01-26: 500 mL

## 2024-01-26 MED ORDER — LIDOCAINE HCL (PF) 1 % IJ SOLN
INTRAMUSCULAR | Status: AC
  Filled 2024-01-26: qty 30

## 2024-01-26 MED ORDER — IODIXANOL 320 MG/ML IV SOLN
INTRAVENOUS | Status: DC | PRN
  Administered 2024-01-26: 30 mL via INTRAVENOUS

## 2024-01-26 SURGICAL SUPPLY — 4 items
KIT MICROPUNCTURE NIT STIFF (SHEATH) ×1 IMPLANT
SHEATH PROBE COVER 6X72 (BAG) ×1 IMPLANT
TRAY PV CATH (CUSTOM PROCEDURE TRAY) ×2 IMPLANT
TUBING CIL FLEX 10 FLL-RA (TUBING) ×1 IMPLANT

## 2024-01-26 NOTE — H&P (Signed)
   Patient name: Kayla [name] MRN: [id number] DOB: [birth year] Sex: female  REASON FOR VISIT:    Difficulty with cannulation  HISTORY OF PRESENT ILLNESS:   Kayla [name] is a 83 y.o. female with end-stage renal disease who currently dialyzes through a catheter.  She had a left brachiocephalic fistula created by Dr. Lanis in 2024.  They have attempted to use this however had had infiltrations and so she is here today for fistulogram.  Post fistula duplex showed excellent flow volumes and excellent diameter of the vein however it was tortuous.  CURRENT MEDICATIONS:    No current facility-administered medications for this encounter.    REVIEW OF SYSTEMS:   [X]  denotes positive finding, [ ]  denotes negative finding Cardiac  Comments:  Chest pain or chest pressure:    Shortness of breath upon exertion:    Short of breath when lying flat:    Irregular heart rhythm:    Constitutional    Fever or chills:      PHYSICAL EXAM:   Vitals:   01/26/24 0928 01/26/24 0944  BP: (!) 180/67 (!) 180/67  Pulse: 61 (!) 56  Resp: 12 16  Temp: 97.8 F (36.6 C)   TempSrc: Oral   SpO2: 96% 100%    GENERAL: The patient is a well-nourished female, in no acute distress. The vital signs are documented above. CARDIOVASCULAR: There is a regular rate and rhythm. PULMONARY: Non-labored respirations Good thrill in left arm fistula the vein is superficial but tortuous  STUDIES:      MEDICAL ISSUES:   ESRD: We discussed proceeding with fistulogram and intervention as indicated.  The details of the procedure discussed.  All questions were answered and she wished to proceed.  Malvina Serene CLORE, MD, FACS [facility] [phone] Pager [phone]

## 2024-01-26 NOTE — Op Note (Signed)
    Patient name: Kayla [name] MRN: [id number] DOB: 1941-01-26 Sex: female  01/26/2024 Pre-operative Diagnosis: ESRD Post-operative diagnosis:  Same Surgeon:  Malvina New Procedure Performed:  1.  Ultrasound-guided access, left brachiocephalic fistula  2.  Fistulogram    Indications: This is an 83 year old female who is having trouble with cannulation and infiltration.  She is here for fistulogram.  Procedure:  The patient was identified in the holding area and taken to room 8.  The patient was then placed supine on the table and prepped and draped in the usual sterile fashion.  A time out was called.  Ultrasound was used to evaluate the fistula.  The vein was patent and compressible.  A digital ultrasound image was acquired.  The fistula was then accessed under ultrasound guidance using a micropuncture needle.  An 018 wire was then asvanced without resistance and a micropuncture sheath was placed.  Contrast injections were then performed through the sheath.  Findings: No evidence of central venous stenosis.  The cephalic vein fistula is widely patent in the upper arm.  The arteriovenous anastomosis is widely patent.  There is a large branch in the upper portion of the fistula and a small 1 near the anastomosis.  The vein is very mobile and rolls frequently.  Impression:  #1  Fistula is widely patent with no significant stenosis  #2  The patient does have a large branch in the upper arm and the small and near the anastomosis.  I suspect she is having difficulty with cannulation because of her thin skin and how easily the cephalic vein rolls.  I have recommended that we proceed with branch ligation and some form of superficialization so that the fistula can scar and and not be as mobile.   ALONSO Malvina New, M.D., [facility] [place] Office: [phone] Pager:  [phone]

## 2024-01-27 ENCOUNTER — Telehealth: Payer: Self-pay

## 2024-01-27 DIAGNOSIS — G894 Chronic pain syndrome: Secondary | ICD-10-CM | POA: Diagnosis not present

## 2024-01-27 DIAGNOSIS — Z79899 Other long term (current) drug therapy: Secondary | ICD-10-CM | POA: Diagnosis not present

## 2024-01-27 DIAGNOSIS — J9611 Chronic respiratory failure with hypoxia: Secondary | ICD-10-CM | POA: Diagnosis not present

## 2024-01-27 DIAGNOSIS — Z79891 Long term (current) use of opiate analgesic: Secondary | ICD-10-CM | POA: Diagnosis not present

## 2024-01-27 DIAGNOSIS — J441 Chronic obstructive pulmonary disease with (acute) exacerbation: Secondary | ICD-10-CM | POA: Diagnosis not present

## 2024-01-27 NOTE — Telephone Encounter (Signed)
 Spoke to patient's spouse in re: to scheduling surgery with Dr. Lanis.  Needs a Tuesday but states 5:30am arrival time is too early.  Patient's husband has asked this nurse to call again next Tuesday, 9/30.

## 2024-01-28 DIAGNOSIS — Z992 Dependence on renal dialysis: Secondary | ICD-10-CM | POA: Diagnosis not present

## 2024-01-28 DIAGNOSIS — T8249XD Other complication of vascular dialysis catheter, subsequent encounter: Secondary | ICD-10-CM | POA: Diagnosis not present

## 2024-01-28 DIAGNOSIS — N2581 Secondary hyperparathyroidism of renal origin: Secondary | ICD-10-CM | POA: Diagnosis not present

## 2024-01-28 DIAGNOSIS — D631 Anemia in chronic kidney disease: Secondary | ICD-10-CM | POA: Diagnosis not present

## 2024-01-28 DIAGNOSIS — N186 End stage renal disease: Secondary | ICD-10-CM | POA: Diagnosis not present

## 2024-01-31 DIAGNOSIS — D631 Anemia in chronic kidney disease: Secondary | ICD-10-CM | POA: Diagnosis not present

## 2024-01-31 DIAGNOSIS — T8249XD Other complication of vascular dialysis catheter, subsequent encounter: Secondary | ICD-10-CM | POA: Diagnosis not present

## 2024-01-31 DIAGNOSIS — N186 End stage renal disease: Secondary | ICD-10-CM | POA: Diagnosis not present

## 2024-01-31 DIAGNOSIS — Z992 Dependence on renal dialysis: Secondary | ICD-10-CM | POA: Diagnosis not present

## 2024-01-31 DIAGNOSIS — N2581 Secondary hyperparathyroidism of renal origin: Secondary | ICD-10-CM | POA: Diagnosis not present

## 2024-02-01 ENCOUNTER — Telehealth: Payer: Self-pay

## 2024-02-01 DIAGNOSIS — N186 End stage renal disease: Secondary | ICD-10-CM | POA: Diagnosis not present

## 2024-02-01 DIAGNOSIS — E1122 Type 2 diabetes mellitus with diabetic chronic kidney disease: Secondary | ICD-10-CM | POA: Diagnosis not present

## 2024-02-01 DIAGNOSIS — Z992 Dependence on renal dialysis: Secondary | ICD-10-CM | POA: Diagnosis not present

## 2024-02-01 NOTE — Telephone Encounter (Signed)
 Attempted to call for surgery scheduling. LVM

## 2024-02-04 ENCOUNTER — Other Ambulatory Visit: Payer: Self-pay

## 2024-02-04 DIAGNOSIS — N186 End stage renal disease: Secondary | ICD-10-CM

## 2024-02-07 ENCOUNTER — Ambulatory Visit (INDEPENDENT_AMBULATORY_CARE_PROVIDER_SITE_OTHER)

## 2024-02-07 ENCOUNTER — Ambulatory Visit: Admitting: Podiatry

## 2024-02-07 ENCOUNTER — Encounter: Payer: Self-pay | Admitting: Podiatry

## 2024-02-07 VITALS — Ht 65.0 in | Wt 189.1 lb

## 2024-02-07 DIAGNOSIS — S92404A Nondisplaced unspecified fracture of right great toe, initial encounter for closed fracture: Secondary | ICD-10-CM

## 2024-02-07 DIAGNOSIS — M7751 Other enthesopathy of right foot: Secondary | ICD-10-CM

## 2024-02-07 MED ORDER — DOXYCYCLINE HYCLATE 100 MG PO TABS: 100.0000 mg | ORAL_TABLET | Freq: Two times a day (BID) | ORAL | 0 refills | Status: DC

## 2024-02-08 NOTE — Progress Notes (Signed)
 Chief Complaint  Patient presents with   Foot Pain    Pt is here due to pain in the right foot, states no injury but to the foot is bruised on the top and bottom states foot is very painful to walk on, no other concerns.    HPI: 83 y.o. female presenting today for evaluation of discoloration and bruising with swelling to the right great toe.  History of great toe amputation to the left foot.  She denies any history of injury and she is very concerned because she states that the left great toe was ultimately amputated after presenting similarly to the right great toe that she is experiencing now.  It is very painful and she cannot put pressure on the foot  Past Medical History:  Diagnosis Date   Allergy    Anxiety    Arthritis    CAD (coronary artery disease)    Mild per remote cath in 2006, /    Nuclear, January, 2013, no significant abnormality,   Carotid artery disease    Doppler, January, 2013, 0 39% RIC A., 40-59% LICA, stable   Cataract    CHF (congestive heart failure) (HCC)    Chronic kidney disease    Complication of anesthesia    wakes up mean   COPD 02/16/2007   Intolerant of Spiriva , tudorza Intolerant of Trelegy    COPD (chronic obstructive pulmonary disease) (HCC)    Coronary atherosclerosis 11/24/2008   Catheterization 2006, nonobstructive coronary disease   //   nuclear 2013, low risk     Diabetes mellitus, type 2 (HCC)    Diverticula of colon    DIVERTICULOSIS OF COLON 03/23/2007   Qualifier: Diagnosis of  By: Shellia MD, Vineet     Dizziness    occasional   Dizzy spells 05/04/2011   DM (diabetes mellitus) (HCC) 03/23/2007   Qualifier: Diagnosis of  By: Shellia MD, Vineet     Dyspnea    On exertion   Ejection fraction    Essential hypertension 02/16/2007   Qualifier: Diagnosis of  By: Nicholaus CMA, Tammy     G E R D 03/23/2007   Qualifier: Diagnosis of  By: Shellia MD, Vineet     Gall bladder disease 04/28/2016   Gastritis and gastroduodenitis    GERD  (gastroesophageal reflux disease)    Goiter    hx of   Headache(784.0)    migraine   Hyperlipidemia    Hypertension    Hypothyroidism    HYPOTHYROIDISM, POSTSURGICAL 06/04/2008   Qualifier: Diagnosis of  By: Kassie MD, Sean A    Left ventricular hypertrophy    Leg edema    occasional swelling   Leg swelling 12/09/2018   Myofascial pain syndrome, cervical 01/06/2018   Obesity    OBESITY 11/24/2008   Qualifier: Diagnosis of  By: Jaramillo, Luz     OBSTRUCTIVE SLEEP APNEA 02/16/2007   Auto CPAP 09/03/13 to 10/02/13 >> used on 30 of 30 nights with average 6 hrs and 3 min.  Average AHI is 3.1 with median CPAP 5 cm H2O and 95 th percentile CPAP 6 cm H2O.     Occipital neuralgia of right side 01/06/2018   OSA (obstructive sleep apnea)    cpap setting of 12   Pain in joint of right hip 01/21/2018   Palpitations 01/21/2011   48 hour Holter, 2015, scattered PACs and PVCs.    Paroxysmal atrial fibrillation (HCC)    Pneumonia    PONV (postoperative nausea and vomiting)  severe after every surgery   Pulmonary hypertension, secondary    RHINITIS 07/21/2010        RUQ abdominal pain    S/P left TKA 07/27/2011   Sinus node dysfunction (HCC) 11/08/2015   Sleep apnea    Spinal stenosis of cervical region 11/07/2014   Urinary frequency 12/09/2018   UTI (urinary tract infection) 08/15/2019   per pt report   VENTRICULAR HYPERTROPHY, LEFT 11/24/2008   Qualifier: Diagnosis of  By: Justina Kos      Past Surgical History:  Procedure Laterality Date   A/V FISTULAGRAM N/A 01/26/2024   Procedure: A/V Fistulagram;  Surgeon: Serene Gaile ORN, MD;  Location: [facility];  Service: Cardiovascular;  Laterality: N/A;   ABDOMINAL HYSTERECTOMY     with appendectomy and colon polypectomy   AMPUTATION TOE Left 12/15/2019   Procedure: AMPUTATION  LEFT GREAT TOE;  Surgeon: Gretel Ozell PARAS, DPM;  Location: [facility];  Service: Podiatry;  Laterality: Left;   AMPUTATION TOE Left 01/22/2022   Procedure:  AMPUTATION TOE 4th digit;  Surgeon: Tobie Franky SQUIBB, DPM;  Location: [facility];  Service: Podiatry;  Laterality: Left;   AMPUTATION TOE Left 09/04/2022   Procedure: LEFT BIG TOE AMPUTAION;  Surgeon: Gershon Donnice SAUNDERS, DPM;  Location: [facility];  Service: Podiatry;  Laterality: Left;   ANTERIOR CERVICAL DECOMP/DISCECTOMY FUSION N/A 11/07/2014   Procedure: Anterior Cervical diskectomy and fusion Cervical four-five, Cervical five-six, Cervical six-seven;  Surgeon: Arley Helling, MD;  Location: [facility];  Service: Neurosurgery;  Laterality: N/A;   APPENDECTOMY     ARTERY BIOPSY Right 02/16/2017   Procedure: BIOPSY TEMPORAL ARTERY;  Surgeon: Jama Cordella MATSU, MD;  Location: [facility];  Service: Vascular;  Laterality: Right;   ARTERY BIOPSY Left 04/16/2020   Procedure: BIOPSY TEMPORAL ARTERY;  Surgeon: Jama Cordella MATSU, MD;  Location: [facility];  Service: Vascular;  Laterality: Left;  LEFT TEMPLE   AV FISTULA PLACEMENT Left 04/13/2023   Procedure: LEFT ARM ARTERIOVENOUS (AV) FISTULA CREATION;  Surgeon: Lanis Fonda BRAVO, MD;  Location: [facility];  Service: Vascular;  Laterality: Left;   BACK SURGERY     CARDIAC CATHETERIZATION     CATARACT EXTRACTION W/ INTRAOCULAR LENS  IMPLANT, BILATERAL Bilateral    CHOLECYSTECTOMY N/A 04/29/2016   Procedure: LAPAROSCOPIC CHOLECYSTECTOMY WITH INTRAOPERATIVE CHOLANGIOGRAM;  Surgeon: Alm Angle, MD;  Location: [facility];  Service: General;  Laterality: N/A;   EP IMPLANTABLE DEVICE N/A 11/08/2015   MRI COMPATABLE -- Procedure: Pacemaker Implant;  Surgeon: Elspeth JAYSON Sage, MD;  [facility]: [facility];  Service: Cardiovascular;  Laterality: N/A;   INCISION AND DRAINAGE ABSCESS Right 05/30/2021   Procedure: IRRIGATION AND DEBRIDEMENT OF PREPATELLA BURSA;  Surgeon: Ernie Donnice, MD;  Location: [facility];  Service: Orthopedics;  Laterality: Right;  60 wants standard knee draping and pulse lavage   INCISION AND DRAINAGE ABSCESS Right 07/22/2021   Procedure: INCISION AND DRAINAGE ABSCESS  RIGHT KNEE;  Surgeon: Ernie Donnice, MD;  Location: [facility];  Service: Orthopedics;  Laterality: Right;   INSERT / REPLACE / REMOVE PACEMAKER  2017   Dr. Beverli [facility])   IR FLUORO GUIDE CV LINE RIGHT  07/25/2021   IR FLUORO GUIDE CV LINE RIGHT  11/04/2022   IR REMOVAL TUN CV CATH W/O FL  09/04/2021   IR US  GUIDE VASC ACCESS RIGHT  07/25/2021   IR US  GUIDE VASC ACCESS RIGHT  11/04/2022   JOINT REPLACEMENT     LEAD EXTRACTION N/A 09/08/2022   Procedure: LEAD  EXTRACTION;  Surgeon: Waddell Danelle ORN, MD;  Location[facility];  Service: Cardiovascular;  Laterality: N/A;   LEAD EXTRACTION N/A 09/11/2022   Procedure: LEAD EXTRACTION;  Surgeon: Waddell Danelle ORN, MD;  [facility];  Service: Cardiovascular;  Laterality: N/A;   LUMBAR DISC SURGERY  1990's   x 2   TEE WITHOUT CARDIOVERSION N/A 09/07/2022   Procedure: TRANSESOPHAGEAL ECHOCARDIOGRAM;  Surgeon: Santo Stanly LABOR, MD;  [facility];  Service: Cardiovascular;  Laterality: N/A;   THYROIDECTOMY  2011   TOTAL KNEE ARTHROPLASTY  07/27/2011   Procedure: TOTAL KNEE ARTHROPLASTY;  Surgeon: Donnice JONETTA Car, MD;  Location: [facility];  Service: Orthopedics;  Laterality: Left;   UPPER ESOPHAGEAL ENDOSCOPIC ULTRASOUND (EUS) N/A 05/14/2016   Procedure: UPPER ESOPHAGEAL ENDOSCOPIC ULTRASOUND (EUS);  Surgeon: Toribio SHAUNNA Cedar, MD;  Location: [facility];  Service: Endoscopy;  Laterality: N/A;  radial only-will be done mod if needed    Allergies  Allergen Reactions   Oxycodone  Itching and Other (See Comments)    Pt still takes this med even thought it causes itching   Trelegy Ellipta  [Fluticasone -Umeclidin-Vilant] Nausea And Vomiting, Other (See Comments) and Cough    Made the patient sick to her stomach (only) several days after using it    Trulicity [Dulaglutide] Nausea And Vomiting, Nausea Only, Other (See Comments) and Cough    TRULICITY made the patient sick to her stomach (only) several days after using it      Physical Exam: General: The patient is alert and oriented x3 in no acute distress.  Dermatology: Skin is warm, dry and supple bilateral lower extremities.  No open wounds or lacerations  Vascular: Palpable pedal pulses bilaterally.  Heavy ecchymosis with edema noted around the right great toe and diffusely throughout the forefoot.  There is no erythema concerning for infection  Neurological: Light touch and protective threshold absent  Musculoskeletal Exam: Prior history of amputation to the left great toe.  Radiographic Exam RT foot 02/08/2024:  Comminuted mildly displaced fracture noted along the base of the proximal phalanx right great toe with chronic arthritis around the sesamoids and diffusely throughout the foot  Assessment/Plan of Care: 1.  Closed displaced intra-articular fracture of the proximal phalanx right great toe.  DOI: 02/04/2024  -Patient evaluated.  X-rays reviewed -The patient is neuropathic and does not recall any injury or incident that would have elicited the fracture.  The fracture and x-rays were reviewed in detail with the patient.  She does recall that the onset of the swelling and bruising and discoloration to the toe was this past Friday, 02/04/2024 -RICE -Short cam boot dispensed.  WBAT -Prescription for doxycycline  100 mg twice daily x 10 days given the patient's history of left foot amputation.  She states that the left great toe was amputated with the initial presentation that she is experiencing currently to the right great toe.  She said that the left great toe amputation was not associated to an open wound.  Prescription for antibiotics sent as a precaution -Return to clinic 2 weeks     Thresa EMERSON Sar, DPM [facility]  Dr. Thresa EMERSON Sar, DPM    [address], [place] [address]  Office [phone]  Fax [phone]

## 2024-02-10 ENCOUNTER — Encounter (HOSPITAL_COMMUNITY): Payer: Self-pay | Admitting: Vascular Surgery

## 2024-02-10 ENCOUNTER — Other Ambulatory Visit: Payer: Self-pay

## 2024-02-10 ENCOUNTER — Ambulatory Visit: Admitting: Podiatry

## 2024-02-10 NOTE — Progress Notes (Signed)
 PCP - Dr Charlene [facility] Cardiologist - Dr Oneil Parchment EP - Dr Elspeth Sage Nephrology - [facility]  Chest x-ray - 01/18/24 EKG - 01/20/24 Stress Test - 05/14/11 ECHO - 01/19/24 Cardiac Cath - n/a  ICD Pacemaker - [employer] 09/10/22  Sleep Study -  Yes CPAP - uses CPAP nightly  Diabetes Type 2  Fasting Blood Sugar - 180s Checks Blood Sugar _2_ times a day   THE MORNING OF SURGERY, take 22 units Lantus  Insulin .    Do not take Novolog  Insulin  on the morning of surgery unless your CBG is greater than 220 mg/dL.  If CBG > 220 mg/dL, you may take  of your sliding scale (correction) dose of insulin .  Blood Thinner Instructions:  Hold Eliquis  3 days prior to procedure.  Last dose will be on 02/11/24.  Aspirin  Instructions: n/a  NPO  Anesthesia review: Yes  STOP now taking any Aspirin  (unless otherwise instructed by your surgeon), Aleve , Naproxen , Ibuprofen, Motrin, Advil, Goody's, BC's, all herbal medications, fish oil, and all vitamins.   Coronavirus Screening Do you have any of the following symptoms:  Cough yes/no: No Fever (>100.49F)  yes/no: No Runny nose yes/no: No Sore throat yes/no: No Difficulty breathing/shortness of breath  yes/no: No  Have you traveled in the last 14 days and where? yes/no: No  Patient verbalized understanding of instructions that were given via phone.

## 2024-02-11 NOTE — Progress Notes (Signed)
 Case: [id number] Date/Time: 02/14/24 0715   Procedures:      FISTULA SUPERFICIALIZATION (Left)     LIGATION OF COMPETING BRANCHES OF ARTERIOVENOUS FISTULA (Left)   Anesthesia type: Monitor Anesthesia Care   Diagnosis: End stage renal disease (HCC) [N18.6]   Pre-op  diagnosis: END STAGE RENAL DISEASE   [facility] / [facility]   Surgeons: Lanis Fonda BRAVO, MD       DISCUSSION: Patient is an 83 year old female scheduled for the above procedure. History includes former smoker (quit 05/04/97), post-operative N/V, HTN, nonobstructive CAD (by cath and CTA), PAF, pulmonary hypertension, HFpEF, bradycardia (s/p Medtronic PPM 11/09/15, extracted 09/08/22 due to MRSA bacteria with RV lead vegetation), hx of CVA (on imaging), IDDM, OSA (uses CPAP), COPD (3L home O2), ESRD (HD initiated 11/11/22), PAD (left great toe amputation 12/15/19; left 4th toe amputation 01/22/22; left partial 1st ray amputation 09/04/22), GERD, thyroid  goiter (s/p thyroidectomy 04/17/08, post surgical hypothyroidism), spinal surgery C4-7 ACDF 11/07/14), osteoarthritis (left TKA 07/27/11), infected right prepatellar bursa (s/p excisional debridement 05/31/21, 07/22/21), cholecystectomy (04/29/16). Reported waking up from anesthesia mean.  Admitted from 9/16-9/18 for COPD exacerbation. Treated with nebs, steroids, antibiotics and discharged when O2 was back to baseline (3L). Echo repeated while inaptient and showed preserved LV systolic function with Ef 55 to 60%, Mild LVH, diastolic dysfunction grade II with pseudo normalization EA, RV normal systolic function, mild MR, moderate to severe tricuspid regurgitation, mild aortic valve stenosis, RVSP 53.9 mmHg.  Patient on dialysis M/F and having difficulty with cannulation. Went for fistulogram on 01/26/24 with Dr. Serene. Fistula found to be patent but difficulty with cannulation thought to be due to how easily the cephalic vein rolls. Advised to have above procedure.  Seen by Podiatry on 02/07/24  for right toe pain and found to have acute fracture of the proximal great toe. Made WBAT with Cam boot. Also given Rx for Doxy.  Followed by pulmonologist Dr. Neda for mild OSA on CPAP and for moderate to severe COPD with chronic hypoxic respiratory failure on continuous 3L O2. Last visit 05/19/23. Stable at that visit and without concerns. Advised continue Breztri  and albuterol . Continue CPAP.    Last seen by Cardiology on 05/19/23 by Dr. Jeffrie. Patient stable at that visit. No need for pacemaker replacement at this time. Bradycardia in the 40s and 50s noted. No plans for reimplantation unless symptomatic bradycardia occurs. CHF managed with Torsemide  and dialysis. She is on renally dosed Eliquis  for PAF. Advised f/u in 1 year.   VS: Ht 5' 5 (1.651 m)   Wt 87.9 kg   BMI 32.25 kg/m   PROVIDERS: Hamrick, Charlene CROME, MD Jeffrie Anes, MD is cardiologist Neda Hammond, MD is pulmonologist Gearline Norris, MD is nephrologist Fernande Standing, MD is EP cardiologist  LABS: Obtain DOS   EKG: 01/20/24:  Normal sinus rhythm with PACs Otherwise normal ECG When compared with ECG of 18-Jan-2024 19:02, No significant change since last tracing   Echo 01/19/24:  IMPRESSIONS     1. Left ventricular ejection fraction, by estimation, is 55 to 60%. The  left ventricle has normal function. The left ventricle has no regional  wall motion abnormalities. There is mild concentric left ventricular  hypertrophy. Left ventricular diastolic  parameters are consistent with Grade II diastolic dysfunction  (pseudonormalization). Elevated left ventricular end-diastolic pressure.   2. Right ventricular systolic function is normal. The right ventricular  size is normal. There is moderately elevated pulmonary artery systolic  pressure.  3. The mitral valve is normal in structure. Mild mitral valve  regurgitation. No evidence of mitral stenosis.   4. Tricuspid valve regurgitation is moderate to severe.    5. The aortic valve is normal in structure. There is mild calcification  of the aortic valve. There is mild thickening of the aortic valve. Aortic  valve regurgitation is not visualized. Mild aortic valve stenosis. Aortic  valve area, by VTI measures 2.68  cm. Aortic valve mean gradient measures 11.0 mmHg. Aortic valve Vmax  measures 2.21 m/s.   6. The inferior vena cava is normal in size with greater than 50%  respiratory variability, suggesting right atrial pressure of 3 mmHg.   CT Coronary with FFR 12/31/17: IMPRESSION: 1. Coronary calcium  score of 1422. This was 46 percentile for age and sex matched control. 2. Normal coronary origin with right dominance.  3. Diffuse plaque with moderate disease in the RCA and LAD. Additional analysis with CT FFR will be submitted. 1. Left Main:  No significant stenosis. 2. LAD: No significant stenosis. 3. LCX: No significant stenosis. 4. OM1: No significant stenosis. 5. OM2: Distal OM2: CT FFR: 0.78. 6. OM3: No significant stenosis. 7. RCA: No significant stenosis.   IMPRESSION: 1. CT FFR analysis showed stenosis in the distal portion of a small OM2 artery. Aggressive medical management is recommended.  Past Medical History:  Diagnosis Date   Allergy    Anxiety    Arthritis    CAD (coronary artery disease)    Mild per remote cath in 2006, /    Nuclear, January, 2013, no significant abnormality,   Carotid artery disease    Doppler, January, 2013, 0 39% RIC A., 40-59% LICA, stable   Cataract    CHF (congestive heart failure) (HCC)    Chronic kidney disease    dialysis Mon Friday   Complication of anesthesia    wakes up mean   COPD 02/16/2007   Intolerant of Spiriva , tudorza Intolerant of Trelegy    COPD (chronic obstructive pulmonary disease) (HCC)    Coronary atherosclerosis 11/24/2008   Catheterization 2006, nonobstructive coronary disease   //   nuclear 2013, low risk     Depression    Diabetes mellitus, type 2 (HCC)     Diverticula of colon    DIVERTICULOSIS OF COLON 03/23/2007   Qualifier: Diagnosis of  By: Shellia MD, Vineet     Dizziness    occasional   Dizzy spells 05/04/2011   DM (diabetes mellitus) (HCC) 03/23/2007   Qualifier: Diagnosis of  By: Shellia MD, Vineet     Dyspnea    On exertion   Ejection fraction    Essential hypertension 02/16/2007   Qualifier: Diagnosis of  By: Nicholaus CMA, Tammy     G E R D 03/23/2007   Qualifier: Diagnosis of  By: Shellia MD, Vineet     Gall bladder disease 04/28/2016   Gastritis and gastroduodenitis    GERD (gastroesophageal reflux disease)    Goiter    hx of   Headache(784.0)    migraine   Hyperlipidemia    Hypertension    Hypothyroidism    HYPOTHYROIDISM, POSTSURGICAL 06/04/2008   Qualifier: Diagnosis of  By: Kassie MD, Sean A    Left ventricular hypertrophy    Leg edema    occasional swelling   Leg swelling 12/09/2018   Myofascial pain syndrome, cervical 01/06/2018   Obesity    OBESITY 11/24/2008   Qualifier: Diagnosis of  By: Justina Kos     OBSTRUCTIVE  SLEEP APNEA 02/16/2007   Auto CPAP 09/03/13 to 10/02/13 >> used on 30 of 30 nights with average 6 hrs and 3 min.  Average AHI is 3.1 with median CPAP 5 cm H2O and 95 th percentile CPAP 6 cm H2O.     Occipital neuralgia of right side 01/06/2018   OSA (obstructive sleep apnea)    uses cpap - setting of 12   Pain in joint of right hip 01/21/2018   Palpitations 01/21/2011   48 hour Holter, 2015, scattered PACs and PVCs.    Paroxysmal atrial fibrillation (HCC)    Pneumonia    PONV (postoperative nausea and vomiting)    severe after every surgery   Pulmonary hypertension, secondary    RHINITIS 07/21/2010        RUQ abdominal pain    S/P left TKA 07/27/2011   Sinus node dysfunction (HCC) 11/08/2015   Sleep apnea    Spinal stenosis of cervical region 11/07/2014   Urinary frequency 12/09/2018   UTI (urinary tract infection) 08/15/2019   per pt report   VENTRICULAR HYPERTROPHY, LEFT 11/24/2008    Qualifier: Diagnosis of  By: Justina Kos      Past Surgical History:  Procedure Laterality Date   A/V FISTULAGRAM N/A 01/26/2024   Procedure: A/V Fistulagram;  Surgeon: Serene Gaile ORN, MD;  Location: [facility];  Service: Cardiovascular;  Laterality: N/A;   ABDOMINAL HYSTERECTOMY     with appendectomy and colon polypectomy   AMPUTATION TOE Left 12/15/2019   Procedure: AMPUTATION  LEFT GREAT TOE;  Surgeon: Gretel Ozell PARAS, DPM;  Location: [facility];  Service: Podiatry;  Laterality: Left;   AMPUTATION TOE Left 01/22/2022   Procedure: AMPUTATION TOE 4th digit;  Surgeon: Tobie Franky SQUIBB, DPM;  Location: [facility];  Service: Podiatry;  Laterality: Left;   AMPUTATION TOE Left 09/04/2022   Procedure: LEFT BIG TOE AMPUTAION;  Surgeon: Gershon Donnice SAUNDERS, DPM;  Location: [facility];  Service: Podiatry;  Laterality: Left;   ANTERIOR CERVICAL DECOMP/DISCECTOMY FUSION N/A 11/07/2014   Procedure: Anterior Cervical diskectomy and fusion Cervical four-five, Cervical five-six, Cervical six-seven;  Surgeon: Arley Helling, MD;  [facility];  Service: Neurosurgery;  Laterality: N/A;   APPENDECTOMY     ARTERY BIOPSY Right 02/16/2017   Procedure: BIOPSY TEMPORAL ARTERY;  Surgeon: Jama Cordella MATSU, MD;  Location: [facility];  Service: Vascular;  Laterality: Right;   ARTERY BIOPSY Left 04/16/2020   Procedure: BIOPSY TEMPORAL ARTERY;  Surgeon: Jama Cordella MATSU, MD;  Location: [facility] ORS;  Service: Vascular;  Laterality: Left;  LEFT TEMPLE   AV FISTULA PLACEMENT Left 04/13/2023   Procedure: LEFT ARM ARTERIOVENOUS (AV) FISTULA CREATION;  Surgeon: Lanis Fonda BRAVO, MD;  Location[facility];  Service: Vascular;  Laterality: Left;   BACK SURGERY     CARDIAC CATHETERIZATION     CATARACT EXTRACTION W/ INTRAOCULAR LENS  IMPLANT, BILATERAL Bilateral    CHOLECYSTECTOMY N/A 04/29/2016   Procedure: LAPAROSCOPIC CHOLECYSTECTOMY WITH INTRAOPERATIVE CHOLANGIOGRAM;  Surgeon: Alm Angle, MD;  Location: [facility];  Service: General;   Laterality: N/A;   EP IMPLANTABLE DEVICE N/A 11/08/2015   MRI COMPATABLE -- Procedure: Pacemaker Implant;  Surgeon: Elspeth JAYSON Sage, MD;  [facility];  Service: Cardiovascular;  Laterality: N/A;   INCISION AND DRAINAGE ABSCESS Right 05/30/2021   Procedure: IRRIGATION AND DEBRIDEMENT OF PREPATELLA BURSA;  Surgeon: Ernie Donnice, MD;  Location: [facility];  Service: Orthopedics;  Laterality: Right;  60 wants standard knee draping and pulse lavage  INCISION AND DRAINAGE ABSCESS Right 07/22/2021   Procedure: INCISION AND DRAINAGE ABSCESS RIGHT KNEE;  Surgeon: Ernie Cough, MD;  Location: [facility];  Service: Orthopedics;  Laterality: Right;   INSERT / REPLACE / REMOVE PACEMAKER  2017   Dr. [facility])   IR FLUORO GUIDE CV LINE RIGHT  07/25/2021   IR FLUORO GUIDE CV LINE RIGHT  11/04/2022   IR REMOVAL TUN CV CATH W/O FL  09/04/2021   IR US  GUIDE VASC ACCESS RIGHT  07/25/2021   IR US  GUIDE VASC ACCESS RIGHT  11/04/2022   JOINT REPLACEMENT     LEAD EXTRACTION N/A 09/08/2022   Procedure: LEAD EXTRACTION;  Surgeon: Waddell Danelle ORN, MD;  [facility];  Service: Cardiovascular;  Laterality: N/A;   LEAD EXTRACTION N/A 09/11/2022   Procedure: LEAD EXTRACTION;  Surgeon: Waddell Danelle ORN, MD;  [facility];  Service: Cardiovascular;  Laterality: N/A;   LUMBAR DISC SURGERY  1990's   x 2   TEE WITHOUT CARDIOVERSION N/A 09/07/2022   Procedure: TRANSESOPHAGEAL ECHOCARDIOGRAM;  Surgeon: Santo Stanly LABOR, MD;  [facility];  Service: Cardiovascular;  Laterality: N/A;   THYROIDECTOMY  2011   TOTAL KNEE ARTHROPLASTY  07/27/2011   Procedure: TOTAL KNEE ARTHROPLASTY;  Surgeon: Cough JONETTA Ernie, MD;  Location: [facility];  Service: Orthopedics;  Laterality: Left;   UPPER ESOPHAGEAL ENDOSCOPIC ULTRASOUND (EUS) N/A 05/14/2016   Procedure: UPPER ESOPHAGEAL ENDOSCOPIC ULTRASOUND (EUS);  Surgeon: Toribio SHAUNNA Cedar, MD;  Location: [facility];  Service: Endoscopy;   Laterality: N/A;  radial only-will be done mod if needed    MEDICATIONS: No current facility-administered medications for this encounter.    acetaminophen  (TYLENOL ) 500 MG tablet   Acetaminophen -DM (CORICIDIN HBP COLD/COUGH/FLU) 650-20 MG/30ML LIQD   albuterol  (PROVENTIL ) (2.5 MG/3ML) 0.083% nebulizer solution   albuterol  (VENTOLIN  HFA) 108 (90 Base) MCG/ACT inhaler   Alcohol  Swabs  (B-D SINGLE USE SWABS  REGULAR) PADS   amLODipine  (NORVASC ) 2.5 MG tablet   amLODipine  (NORVASC ) 5 MG tablet   ascorbic acid  (VITAMIN C ) 1000 MG tablet   atorvastatin  (LIPITOR) 40 MG tablet   benzonatate  (TESSALON ) 100 MG capsule   Blood Glucose Calibration (TRUE METRIX LEVEL 1) Low SOLN   budesonide -glycopyrrolate -formoterol  (BREZTRI  AEROSPHERE) 160-9-4.8 MCG/ACT AERO inhaler   Carboxymethylcellulose Sodium (ARTIFICIAL TEARS OP)   doxycycline  (VIBRA -TABS) 100 MG tablet   ELIQUIS  2.5 MG TABS tablet   escitalopram  (LEXAPRO ) 10 MG tablet   escitalopram  (LEXAPRO ) 5 MG tablet   ferrous sulfate  325 (65 FE) MG tablet   gabapentin  (NEURONTIN ) 800 MG tablet   HYDROcodone -acetaminophen  (NORCO) 7.5-325 MG tablet   insulin  aspart (NOVOLOG ) 100 UNIT/ML FlexPen   isosorbide  mononitrate (IMDUR ) 60 MG 24 hr tablet   LANTUS  100 UNIT/ML injection   levothyroxine  (SYNTHROID ) 200 MCG tablet   nitroGLYCERIN  (NITROSTAT ) 0.4 MG SL tablet   omeprazole  (PRILOSEC OTC) 20 MG tablet   predniSONE  (DELTASONE ) 20 MG tablet   PRESCRIPTION MEDICATION   sevelamer  carbonate (RENVELA ) 800 MG tablet   torsemide  (DEMADEX ) 100 MG tablet   TRUE METRIX BLOOD GLUCOSE TEST test strip   TRUEplus Lancets 28G MISC   vitamin B-12 (CYANOCOBALAMIN ) 1000 MCG tablet   Vitamin D3 (VITAMIN D ) 25 MCG tablet    Burnard CHRISTELLA Senna, PA-C [facility] Short Stay/Anesthesiology [facility] Phone [phone] 02/11/2024 3:42 PM

## 2024-02-11 NOTE — Anesthesia Preprocedure Evaluation (Addendum)
 Anesthesia Evaluation  Patient identified by MRN, date of birth, ID band Patient awake    Reviewed: Allergy & Precautions, NPO status , Patient's Chart, lab work & pertinent test results  History of Anesthesia Complications (+) PONV and history of anesthetic complications  Airway Mallampati: II  TM Distance: >3 FB Neck ROM: Full    Dental  (+) Edentulous Upper, Missing, Dental Advisory Given   Pulmonary sleep apnea , COPD,  COPD inhaler and oxygen  dependent, former smoker   Pulmonary exam normal breath sounds clear to auscultation       Cardiovascular hypertension, Pt. on medications + CAD, + Peripheral Vascular Disease and +CHF  Normal cardiovascular exam+ dysrhythmias Atrial Fibrillation  Rhythm:Regular Rate:Normal  TTE 2025 1. Left ventricular ejection fraction, by estimation, is 55 to 60%. The  left ventricle has normal function. The left ventricle has no regional  wall motion abnormalities. There is mild concentric left ventricular  hypertrophy. Left ventricular diastolic  parameters are consistent with Grade II diastolic dysfunction  (pseudonormalization). Elevated left ventricular end-diastolic pressure.   2. Right ventricular systolic function is normal. The right ventricular  size is normal. There is moderately elevated pulmonary artery systolic  pressure.   3. The mitral valve is normal in structure. Mild mitral valve  regurgitation. No evidence of mitral stenosis.   4. Tricuspid valve regurgitation is moderate to severe.   5. The aortic valve is normal in structure. There is mild calcification  of the aortic valve. There is mild thickening of the aortic valve. Aortic  valve regurgitation is not visualized. Mild aortic valve stenosis. Aortic  valve area, by VTI measures 2.68  cm. Aortic valve mean gradient measures 11.0 mmHg. Aortic valve Vmax  measures 2.21 m/s.   6. The inferior vena cava is normal in size with  greater than 50%  respiratory variability, suggesting right atrial pressure of 3 mmHg.     Neuro/Psych  Headaches PSYCHIATRIC DISORDERS Anxiety Depression       GI/Hepatic Neg liver ROS,GERD  ,,  Endo/Other  diabetes, Type 2, Insulin  DependentHypothyroidism    Renal/GU ESRF and DialysisRenal diseaseDialysis MWF  negative genitourinary   Musculoskeletal  (+) Arthritis ,    Abdominal   Peds  Hematology negative hematology ROS (+)   Anesthesia Other Findings   Reproductive/Obstetrics                              Anesthesia Physical Anesthesia Plan  ASA: 3  Anesthesia Plan: General   Post-op Pain Management: Tylenol  PO (pre-op )*   Induction: Intravenous  PONV Risk Score and Plan: 4 or greater and Treatment may vary due to age or medical condition and Ondansetron   Airway Management Planned: LMA  Additional Equipment:   Intra-op Plan:   Post-operative Plan: Extubation in OR  Informed Consent: I have reviewed the patients History and Physical, chart, labs and discussed the procedure including the risks, benefits and alternatives for the proposed anesthesia with the patient or authorized representative who has indicated his/her understanding and acceptance.     Dental advisory given  Plan Discussed with: CRNA  Anesthesia Plan Comments: (Surgeon requests general anesthesia. )         Anesthesia Quick Evaluation

## 2024-02-14 ENCOUNTER — Ambulatory Visit (HOSPITAL_COMMUNITY)
Admission: RE | Admit: 2024-02-14 | Discharge: 2024-02-14 | Disposition: A | Discharge status: Home / Self Care | Attending: Vascular Surgery | Admitting: Vascular Surgery

## 2024-02-14 ENCOUNTER — Ambulatory Visit (HOSPITAL_COMMUNITY): Payer: Self-pay | Admitting: Medical

## 2024-02-14 ENCOUNTER — Encounter (HOSPITAL_COMMUNITY)
Admission: RE | Disposition: A | Discharge status: Home / Self Care | Payer: Self-pay | Source: Home / Self Care | Attending: Vascular Surgery

## 2024-02-14 ENCOUNTER — Other Ambulatory Visit (HOSPITAL_COMMUNITY): Payer: Self-pay

## 2024-02-14 ENCOUNTER — Encounter (HOSPITAL_COMMUNITY): Payer: Self-pay | Admitting: Vascular Surgery

## 2024-02-14 ENCOUNTER — Other Ambulatory Visit: Payer: Self-pay

## 2024-02-14 DIAGNOSIS — T82590A Other mechanical complication of surgically created arteriovenous fistula, initial encounter: Secondary | ICD-10-CM

## 2024-02-14 DIAGNOSIS — I132 Hypertensive heart and chronic kidney disease with heart failure and with stage 5 chronic kidney disease, or end stage renal disease: Secondary | ICD-10-CM | POA: Diagnosis not present

## 2024-02-14 DIAGNOSIS — Z794 Long term (current) use of insulin: Secondary | ICD-10-CM | POA: Insufficient documentation

## 2024-02-14 DIAGNOSIS — Z9981 Dependence on supplemental oxygen: Secondary | ICD-10-CM | POA: Diagnosis not present

## 2024-02-14 DIAGNOSIS — I5032 Chronic diastolic (congestive) heart failure: Secondary | ICD-10-CM

## 2024-02-14 DIAGNOSIS — N186 End stage renal disease: Secondary | ICD-10-CM | POA: Diagnosis not present

## 2024-02-14 DIAGNOSIS — G4730 Sleep apnea, unspecified: Secondary | ICD-10-CM | POA: Insufficient documentation

## 2024-02-14 DIAGNOSIS — Z992 Dependence on renal dialysis: Secondary | ICD-10-CM | POA: Insufficient documentation

## 2024-02-14 DIAGNOSIS — Z87891 Personal history of nicotine dependence: Secondary | ICD-10-CM | POA: Diagnosis not present

## 2024-02-14 DIAGNOSIS — E1151 Type 2 diabetes mellitus with diabetic peripheral angiopathy without gangrene: Secondary | ICD-10-CM | POA: Insufficient documentation

## 2024-02-14 DIAGNOSIS — E039 Hypothyroidism, unspecified: Secondary | ICD-10-CM | POA: Insufficient documentation

## 2024-02-14 DIAGNOSIS — J449 Chronic obstructive pulmonary disease, unspecified: Secondary | ICD-10-CM | POA: Diagnosis not present

## 2024-02-14 DIAGNOSIS — I4891 Unspecified atrial fibrillation: Secondary | ICD-10-CM | POA: Insufficient documentation

## 2024-02-14 DIAGNOSIS — M1990 Unspecified osteoarthritis, unspecified site: Secondary | ICD-10-CM | POA: Diagnosis not present

## 2024-02-14 DIAGNOSIS — I2510 Atherosclerotic heart disease of native coronary artery without angina pectoris: Secondary | ICD-10-CM | POA: Diagnosis not present

## 2024-02-14 DIAGNOSIS — E1122 Type 2 diabetes mellitus with diabetic chronic kidney disease: Secondary | ICD-10-CM | POA: Insufficient documentation

## 2024-02-14 DIAGNOSIS — I509 Heart failure, unspecified: Secondary | ICD-10-CM | POA: Insufficient documentation

## 2024-02-14 HISTORY — PX: BASCILIC VEIN TRANSPOSITION: SHX5742

## 2024-02-14 HISTORY — PX: LIGATION OF COMPETING BRANCHES OF ARTERIOVENOUS FISTULA: SHX5949

## 2024-02-14 HISTORY — DX: Depression, unspecified: F32.A

## 2024-02-14 HISTORY — PX: REVISON OF ARTERIOVENOUS FISTULA: SHX6074

## 2024-02-14 LAB — POCT I-STAT, CHEM 8
BUN: 57 mg/dL — ABNORMAL HIGH (ref 8–23)
Calcium, Ion: 1.15 mmol/L (ref 1.15–1.40)
Chloride: 102 mmol/L (ref 98–111)
Creatinine, Ser: 3.7 mg/dL — ABNORMAL HIGH (ref 0.44–1.00)
Glucose, Bld: 78 mg/dL (ref 70–99)
HCT: 36 % (ref 36.0–46.0)
Hemoglobin: 12.2 g/dL (ref 12.0–15.0)
Potassium: 3.4 mmol/L — ABNORMAL LOW (ref 3.5–5.1)
Sodium: 140 mmol/L (ref 135–145)
TCO2: 27 mmol/L (ref 22–32)

## 2024-02-14 LAB — GLUCOSE, CAPILLARY
Glucose-Capillary: 79 mg/dL (ref 70–99)
Glucose-Capillary: 118 mg/dL — ABNORMAL HIGH (ref 70–99)

## 2024-02-14 SURGERY — TRANSPOSITION, VEIN, BASILIC
Anesthesia: General | Site: Arm Upper | Laterality: Left

## 2024-02-14 MED ORDER — INSULIN ASPART 100 UNIT/ML IJ SOLN: 0.0000 [IU] | INTRAMUSCULAR | Status: DC | PRN

## 2024-02-14 MED ORDER — ORAL CARE MOUTH RINSE: 15.0000 mL | Freq: Once | OROMUCOSAL | Status: AC

## 2024-02-14 MED ORDER — CHLORHEXIDINE GLUCONATE 4 % EX SOLN: 60.0000 mL | Freq: Once | CUTANEOUS | Status: DC

## 2024-02-14 MED ORDER — FENTANYL CITRATE (PF) 250 MCG/5ML IJ SOLN
INTRAMUSCULAR | Status: AC
  Filled 2024-02-14: qty 5

## 2024-02-14 MED ORDER — DEXAMETHASONE SOD PHOSPHATE PF 10 MG/ML IJ SOLN
INTRAMUSCULAR | Status: DC | PRN
  Administered 2024-02-14: 5 mg via INTRAVENOUS

## 2024-02-14 MED ORDER — HEMOSTATIC AGENTS (NO CHARGE) OPTIME
TOPICAL | Status: DC | PRN
Start: 2024-02-14 — End: 2024-02-14
  Administered 2024-02-14: 1 via TOPICAL

## 2024-02-14 MED ORDER — SODIUM CHLORIDE 0.9 % IV SOLN: INTRAVENOUS | Status: DC

## 2024-02-14 MED ORDER — PROPOFOL 1000 MG/100ML IV EMUL
INTRAVENOUS | Status: AC
  Filled 2024-02-14: qty 100

## 2024-02-14 MED ORDER — PROPOFOL 10 MG/ML IV BOLUS
INTRAVENOUS | Status: DC | PRN
  Administered 2024-02-14: 30 mg via INTRAVENOUS
  Administered 2024-02-14: 50 ug/kg/min via INTRAVENOUS
  Administered 2024-02-14: 70 mg via INTRAVENOUS

## 2024-02-14 MED ORDER — CHLORHEXIDINE GLUCONATE 0.12 % MT SOLN
15.0000 mL | Freq: Once | OROMUCOSAL | Status: AC
  Administered 2024-02-14: 15 mL via OROMUCOSAL
  Filled 2024-02-14: qty 15

## 2024-02-14 MED ORDER — ONDANSETRON HCL 4 MG/2ML IJ SOLN
INTRAMUSCULAR | Status: DC | PRN
  Administered 2024-02-14: 4 mg via INTRAVENOUS

## 2024-02-14 MED ORDER — FENTANYL CITRATE (PF) 100 MCG/2ML IJ SOLN
25.0000 ug | INTRAMUSCULAR | Status: DC | PRN
  Administered 2024-02-14: 25 ug via INTRAVENOUS
  Administered 2024-02-14: 50 ug via INTRAVENOUS

## 2024-02-14 MED ORDER — HEPARIN SODIUM (PORCINE) 1000 UNIT/ML IJ SOLN
INTRAMUSCULAR | Status: AC
  Filled 2024-02-14: qty 10

## 2024-02-14 MED ORDER — ONDANSETRON HCL 4 MG/2ML IJ SOLN
INTRAMUSCULAR | Status: AC
  Filled 2024-02-14: qty 2

## 2024-02-14 MED ORDER — 0.9 % SODIUM CHLORIDE (POUR BTL) OPTIME
TOPICAL | Status: DC | PRN
  Administered 2024-02-14: 1000 mL

## 2024-02-14 MED ORDER — PROPOFOL 10 MG/ML IV BOLUS
INTRAVENOUS | Status: AC
  Filled 2024-02-14: qty 20

## 2024-02-14 MED ORDER — HEPARIN 6000 UNIT IRRIGATION SOLUTION
Status: AC
  Filled 2024-02-14: qty 500

## 2024-02-14 MED ORDER — FENTANYL CITRATE (PF) 250 MCG/5ML IJ SOLN
INTRAMUSCULAR | Status: DC | PRN
  Administered 2024-02-14: 25 ug via INTRAVENOUS

## 2024-02-14 MED ORDER — OXYCODONE HCL 5 MG/5ML PO SOLN: 5.0000 mg | Freq: Once | ORAL | Status: DC | PRN

## 2024-02-14 MED ORDER — OXYCODONE HCL 5 MG PO TABS
5.0000 mg | ORAL_TABLET | ORAL | 0 refills | Status: DC | PRN
  Filled 2024-02-14: qty 15, 3d supply, fill #0

## 2024-02-14 MED ORDER — HEPARIN 6000 UNIT IRRIGATION SOLUTION
Status: DC | PRN
  Administered 2024-02-14: 1

## 2024-02-14 MED ORDER — FENTANYL CITRATE (PF) 100 MCG/2ML IJ SOLN
INTRAMUSCULAR | Status: AC
  Filled 2024-02-14: qty 2

## 2024-02-14 MED ORDER — HEPARIN SODIUM (PORCINE) 1000 UNIT/ML IJ SOLN
INTRAMUSCULAR | Status: DC | PRN
  Administered 2024-02-14: 2000 [IU] via INTRAVENOUS

## 2024-02-14 MED ORDER — OXYCODONE HCL 5 MG PO TABS: 5.0000 mg | ORAL_TABLET | Freq: Once | ORAL | Status: DC | PRN

## 2024-02-14 MED ORDER — CEFAZOLIN SODIUM-DEXTROSE 2-4 GM/100ML-% IV SOLN
2.0000 g | INTRAVENOUS | Status: AC
  Administered 2024-02-14: 2 g via INTRAVENOUS
  Filled 2024-02-14: qty 100

## 2024-02-14 MED ORDER — PHENYLEPHRINE HCL-NACL 20-0.9 MG/250ML-% IV SOLN
INTRAVENOUS | Status: DC | PRN
  Administered 2024-02-14: 50 ug/min via INTRAVENOUS

## 2024-02-14 MED ORDER — PHENYLEPHRINE 80 MCG/ML (10ML) SYRINGE FOR IV PUSH (FOR BLOOD PRESSURE SUPPORT)
PREFILLED_SYRINGE | INTRAVENOUS | Status: DC | PRN
  Administered 2024-02-14: 160 ug via INTRAVENOUS

## 2024-02-14 SURGICAL SUPPLY — 37 items
ARMBAND PINK RESTRICT EXTREMIT (MISCELLANEOUS) ×2 IMPLANT
BAG COUNTER SPONGE SURGICOUNT (BAG) ×2 IMPLANT
BNDG ELASTIC 4INX 5YD STR LF (GAUZE/BANDAGES/DRESSINGS) IMPLANT
BNDG ELASTIC 4X5.8 VLCR STR LF (GAUZE/BANDAGES/DRESSINGS) ×2 IMPLANT
CANISTER SUCTION 3000ML PPV (SUCTIONS) ×2 IMPLANT
CLIP TI MEDIUM 24 (CLIP) IMPLANT
CLIP TI MEDIUM 6 (CLIP) ×2 IMPLANT
CLIP TI WIDE RED SMALL 24 (CLIP) IMPLANT
CLIP TI WIDE RED SMALL 6 (CLIP) ×2 IMPLANT
COVER PROBE W GEL 5X96 (DRAPES) ×2 IMPLANT
DERMABOND ADVANCED .7 DNX12 (GAUZE/BANDAGES/DRESSINGS) ×2 IMPLANT
DRAPE SURG ORHT 6 SPLT 77X108 (DRAPES) IMPLANT
ELECTRODE REM PT RTRN 9FT ADLT (ELECTROSURGICAL) ×2 IMPLANT
GAUZE SPONGE 4X4 12PLY STRL (GAUZE/BANDAGES/DRESSINGS) IMPLANT
GLOVE BIOGEL PI IND STRL 8 (GLOVE) ×2 IMPLANT
GOWN STRL REUS W/ TWL LRG LVL3 (GOWN DISPOSABLE) ×4 IMPLANT
GOWN STRL REUS W/TWL 2XL LVL3 (GOWN DISPOSABLE) ×4 IMPLANT
HEMOSTAT SNOW SURGICEL 2X4 (HEMOSTASIS) IMPLANT
KIT BASIN OR (CUSTOM PROCEDURE TRAY) ×2 IMPLANT
KIT TURNOVER KIT B (KITS) ×2 IMPLANT
PACK CV ACCESS (CUSTOM PROCEDURE TRAY) ×2 IMPLANT
PAD ARMBOARD POSITIONER FOAM (MISCELLANEOUS) ×4 IMPLANT
SLING ARM FOAM STRAP LRG (SOFTGOODS) IMPLANT
SOLN 0.9% NACL 1000 ML (IV SOLUTION) ×2 IMPLANT
SOLN 0.9% NACL POUR BTL 1000ML (IV SOLUTION) ×2 IMPLANT
SOLN STERILE WATER 1000 ML (IV SOLUTION) ×2 IMPLANT
SOLN STERILE WATER BTL 1000 ML (IV SOLUTION) ×2 IMPLANT
SPIKE FLUID TRANSFER (MISCELLANEOUS) ×2 IMPLANT
STAPLER SKIN PROX 35W (STAPLE) IMPLANT
SUT ETHILON 3 0 PS 1 (SUTURE) IMPLANT
SUT MNCRL AB 4-0 PS2 18 (SUTURE) ×4 IMPLANT
SUT PROLENE 6 0 BV (SUTURE) IMPLANT
SUT PROLENE 7 0 BV 1 (SUTURE) ×2 IMPLANT
SUT SILK 0 TIES 10X30 (SUTURE) ×2 IMPLANT
SUT VIC AB 3-0 SH 27X BRD (SUTURE) ×4 IMPLANT
TOWEL GREEN STERILE (TOWEL DISPOSABLE) ×2 IMPLANT
UNDERPAD 30X36 HEAVY ABSORB (UNDERPADS AND DIAPERS) ×2 IMPLANT

## 2024-02-14 NOTE — Op Note (Signed)
    NAME: Kayla [name]    MRN: [id number] DOB: 07/02/1940     DATE OF OPERATION: 02/14/2024  PREOP DIAGNOSIS:    Incisional disease requiring dialysis  POSTOP DIAGNOSIS:    Same  PROCEDURE:    Left arm brachiocephalic fistula revision, branch ligation, transposition  SURGEON: Fonda FORBES Rim  ASSIST: Sherrilee Holster, PA  ANESTHESIA: General  EBL: 20 mL  INDICATIONS:    [name] is a 83 y.o. female with history of end-stage renal disease currently being dialyzed through a right sided tunneled dialysis catheter.  She presents having previously undergone left arm brachiocephalic fistula.  This matured nicely, however is tortuous, and the dialysis center has been unable to cannulate due to the tortuosity.  She presents today for branch ligation, transposition in an effort to straighten out the fistula.  After discussing risk and benefits, the patient elected to proceed.  FINDINGS:   6 mm brachiocephalic fistula with significant tortuosity and twisting.  TECHNIQUE:   Patient was brought to the OR laid in supine position.  General anesthesia was used and the patient was prepped and draped in standard fashion.  The case began with ultrasound insonation of the left arm brachiocephalic fistula.  This was marked.  Two longitudinal incisions were made on the upper arm and the fistula controlled with use of Vesseloops.  Several branches were ligated using 3-0 silk.  Upon evaluation of the fistula, there were multiple areas of significant tortuosity, and native twisting.  I was able to straighten the fistula and appreciated an area of flow-limiting stenosis proximally.  I chose to use this area as the spot for re- anastomosis.  A tunneler was brought into the field and a tunnel was made directly lateral to the incisions.  The patient was then heparinized and the vein transected and run through the tunneler to the antecubital fossa.  6-0 Prolene was then used to reanastomose the  fistula after resecting roughly 4 cm.  Once the fistula was sewn end-to-end, clamps were removed prior to tying down allowing the anastomosis to expand.  The 6-0 Prolene was then tied.  There was an excellent thrill throughout the fistula.  I then flexed to the arm, demonstrating no kinking.  I was happy with this result.  The patient had very thin skin, therefore I used 3-0 Vicryl suture with staples at the level of the skin.  Fonda FORBES Rim, MD [facility] DATE OF DICTATION:   02/14/2024

## 2024-02-14 NOTE — Progress Notes (Signed)
 Wasted Fentanyl  25mcg with Lancie Clark-Curry, RN as witness.

## 2024-02-14 NOTE — Transfer of Care (Signed)
 Immediate Anesthesia Transfer of Care Note  Patient: Kayla [name]  Procedure(s) Performed: TRANSPOSITION,OF FISTULA (Left) LIGATION OF COMPETING BRANCHES OF ARTERIOVENOUS FISTULA (Left) REVISON OF ARTERIOVENOUS FISTULA (Left: Arm Upper)  Patient Location: PACU  Anesthesia Type:General  Level of Consciousness: awake  Airway & Oxygen  Therapy: Patient Spontanous Breathing and Patient connected to nasal cannula oxygen   Post-op Assessment: Report given to RN and Post -op Vital signs reviewed and stable  Post vital signs: Reviewed and stable  Last Vitals:  Vitals Value Taken Time  BP 112/67 02/14/24 09:24  Temp    Pulse 91 02/14/24 09:27  Resp 21 02/14/24 09:27  SpO2 93 % 02/14/24 09:27  Vitals shown include unfiled device data.  Last Pain:  Vitals:   02/14/24 0636  TempSrc:   PainSc: 3       Patients Stated Pain Goal: 1 (02/14/24 0636)  Complications: No notable events documented.

## 2024-02-14 NOTE — Anesthesia Postprocedure Evaluation (Signed)
 Anesthesia Post Note  Patient: Kayla [name]  Procedure(s) Performed: TRANSPOSITION,OF FISTULA (Left) LIGATION OF COMPETING BRANCHES OF ARTERIOVENOUS FISTULA (Left) REVISON OF ARTERIOVENOUS FISTULA (Left: Arm Upper)     Patient location during evaluation: PACU Anesthesia Type: General Level of consciousness: awake and alert Pain management: pain level controlled Vital Signs Assessment: post-procedure vital signs reviewed and stable Respiratory status: spontaneous breathing, nonlabored ventilation, respiratory function stable and patient connected to nasal cannula oxygen  Cardiovascular status: blood pressure returned to baseline and stable Postop Assessment: no apparent nausea or vomiting Anesthetic complications: no   No notable events documented.  Last Vitals:  Vitals:   02/14/24 0945 02/14/24 1000  BP: 134/68 (!) 135/50  Pulse: 90 89  Resp: (!) 23 18  Temp:  37 C  SpO2: 95% 93%    Last Pain:  Vitals:   02/14/24 1000  TempSrc:   PainSc: 3                  Dehlia Kilner L Brittiney Dicostanzo

## 2024-02-14 NOTE — Anesthesia Procedure Notes (Signed)
 Procedure Name: LMA Insertion Date/Time: 02/14/2024 7:42 AM  Performed by: Nikitia Asbill A, CRNAPre-anesthesia Checklist: Patient identified, Emergency Drugs available, Suction available and Patient being monitored Patient Re-evaluated:Patient Re-evaluated prior to induction Oxygen  Delivery Method: Circle System Utilized Preoxygenation: Pre-oxygenation with 100% oxygen  Induction Type: IV induction Ventilation: Mask ventilation without difficulty LMA: LMA inserted LMA Size: 4.0 Number of attempts: 1 Airway Equipment and Method: Bite block Placement Confirmation: positive ETCO2 Tube secured with: Tape Dental Injury: Teeth and Oropharynx as per pre-operative assessment

## 2024-02-14 NOTE — Discharge Instructions (Signed)
   [facility]  Discharge Instructions  AV Fistula or Graft Surgery for Dialysis Access  Please refer to the following instructions for your post-procedure care. Your surgeon or physician assistant will discuss any changes with you.  Activity  You may drive the day following your surgery, if you are comfortable and no longer taking prescription pain medication. Resume full activity as the soreness in your incision resolves.  Bathing/Showering  You may shower after you go home. Keep your incision dry for 48 hours. Do not soak in a bathtub, hot tub, or swim until the incision heals completely. You may not shower if you have a hemodialysis catheter.  Incision Care  Clean your incision with mild soap and water after 48 hours. Pat the area dry with a clean towel. You do not need a bandage unless otherwise instructed. Do not apply any ointments or creams to your incision. You may have skin glue on your incision. Do not peel it off. It will come off on its own in about one week. Your arm may swell a bit after surgery. To reduce swelling use pillows to elevate your arm so it is above your heart. Your doctor will tell you if you need to lightly wrap your arm with an ACE bandage.  Diet  Resume your normal diet. There are not special food restrictions following this procedure. In order to heal from your surgery, it is CRITICAL to get adequate nutrition. Your body requires vitamins, minerals, and protein. Vegetables are the best source of vitamins and minerals. Vegetables also provide the perfect balance of protein. Processed food has little nutritional value, so try to avoid this.  Medications  Resume taking all of your medications. If your incision is causing pain, you may take over-the counter pain relievers such as acetaminophen  (Tylenol ). If you were prescribed a stronger pain medication, please be aware these medications can cause nausea and constipation. Prevent  nausea by taking the medication with a snack or meal. Avoid constipation by drinking plenty of fluids and eating foods with high amount of fiber, such as fruits, vegetables, and grains.  Do not take Tylenol  if you are taking prescription pain medications.  Follow up Your surgeon may want to see you in the office following your access surgery. If so, this will be arranged at the time of your surgery.  Please call us  immediately for any of the following conditions:  Increased pain, redness, drainage (pus) from your incision site Fever of 101 degrees or higher Severe or worsening pain at your incision site Hand pain or numbness.  Reduce your risk of vascular disease:  Stop smoking. If you would like help, call [facility] at 1-800-QUIT-NOW (780-280-9278) or [place] at ([phone] [phone]  Manage your cholesterol Maintain a desired weight Control your diabetes Keep your blood pressure down  Dialysis  You can use your catheter for dialysis until your surgeon says it is okay to use your fistula.   02/14/2024 Kayla [name] [id number] [birth year]  Surgeon(s): Lanis Fonda BRAVO, MD  Procedure(s): TRANSPOSITION,OF FISTULA LIGATION OF COMPETING BRANCHES OF ARTERIOVENOUS FISTULA REVISON OF ARTERIOVENOUS FISTULA   May stick graft immediately   May stick graft on designated area only:   x Do not stick fistula for 4 weeks    If you have any questions, please call the office at [phone].

## 2024-02-14 NOTE — H&P (Signed)
   Patient name: Kayla [name] MRN: [id number] DOB: 1940/07/14 Sex: female  Patient seen and examined in preop holding.  No complaints. No changes to medication history or physical exam since last seen in clinic. After discussing the risks and benefits of left arm fistula branch ligation and superficialization, Hurtis VEAR Sharps elected to proceed.   Fonda FORBES Rim MD     REASON FOR VISIT:    Difficulty with cannulation  HISTORY OF PRESENT ILLNESS:   Kayla [name] is a 83 y.o. female with end-stage renal disease who currently dialyzes through a catheter.  She had a left brachiocephalic fistula created by Dr. Rim in 2024.  They have attempted to use this however had had infiltrations and so she is here today for fistulogram.  Post fistula duplex showed excellent flow volumes and excellent diameter of the vein however it was tortuous.  CURRENT MEDICATIONS:    Current Facility-Administered Medications  Medication Dose Route Frequency Provider Last Rate Last Admin   0.9 %  sodium chloride  infusion   Intravenous Continuous Rim Fonda FORBES, MD       0.9 %  sodium chloride  infusion   Intravenous Continuous Woodrum, Chelsey L, MD       ceFAZolin  (ANCEF ) IVPB 2g/100 mL premix  2 g Intravenous 30 min Pre-Op  Horatio Bertz E, MD       chlorhexidine  (HIBICLENS ) 4 % liquid 4 Application  60 mL Topical Once Khalid Lacko E, MD       And   chlorhexidine  (HIBICLENS ) 4 % liquid 4 Application  60 mL Topical Once Paula Busenbark E, MD       insulin  aspart (novoLOG ) injection 0-7 Units  0-7 Units Subcutaneous Q2H PRN Woodrum, Chelsey L, MD        REVIEW OF SYSTEMS:   [X]  denotes positive finding, [ ]  denotes negative finding Cardiac  Comments:  Chest pain or chest pressure:    Shortness of breath upon exertion:    Short of breath when lying flat:    Irregular heart rhythm:    Constitutional    Fever or chills:      PHYSICAL EXAM:   Vitals:    02/10/24 1652 02/14/24 0605  BP:  (!) 152/65  Pulse:  75  Resp:  18  Temp:  (!) 97.4 F (36.3 C)  TempSrc:  Oral  SpO2:  98%  Weight: 87.9 kg 86.2 kg  Height: 5' 5 (1.651 m) 5' 5 (1.651 m)    GENERAL: The patient is a well-nourished female, in no acute distress. The vital signs are documented above. CARDIOVASCULAR: There is a regular rate and rhythm. PULMONARY: Non-labored respirations Good thrill in left arm fistula the vein is superficial but tortuous  STUDIES:      MEDICAL ISSUES:   ESRD: We discussed proceeding with fistulogram and intervention as indicated.  The details of the procedure discussed.  All questions were answered and she wished to proceed.  Malvina Serene CLORE, MD, FACS [facility] [phone] Pager [phone]

## 2024-02-15 ENCOUNTER — Encounter (HOSPITAL_COMMUNITY): Payer: Self-pay | Admitting: Vascular Surgery

## 2024-02-23 ENCOUNTER — Encounter: Payer: Self-pay | Admitting: Podiatry

## 2024-02-23 ENCOUNTER — Ambulatory Visit (INDEPENDENT_AMBULATORY_CARE_PROVIDER_SITE_OTHER): Admitting: Podiatry

## 2024-02-23 VITALS — Ht 65.0 in | Wt 190.0 lb

## 2024-02-23 DIAGNOSIS — S92404A Nondisplaced unspecified fracture of right great toe, initial encounter for closed fracture: Secondary | ICD-10-CM | POA: Diagnosis not present

## 2024-02-23 NOTE — Progress Notes (Signed)
 Chief Complaint  Patient presents with   Toe Injury    Pt is here to f/u on right great toe due to fracture, she states her toe feels better, and has no pain at all.    HPI: 83 y.o. female presenting today for follow-up evaluation of right great toe fracture with erythema and swelling.  Overall improvement today.  She has no pain associated to the toe.  The pain is essentially resolved with rest  History of left great toe potation after presenting similarly to the right great toe that she is currently experiencing.   Past Medical History:  Diagnosis Date   Allergy    Anxiety    Arthritis    CAD (coronary artery disease)    Mild per remote cath in 2006, /    Nuclear, January, 2013, no significant abnormality,   Carotid artery disease    Doppler, January, 2013, 0 39% RIC A., 40-59% LICA, stable   Cataract    CHF (congestive heart failure) (HCC)    Chronic kidney disease    dialysis Mon Friday   Complication of anesthesia    wakes up mean   COPD 02/16/2007   Intolerant of Spiriva , tudorza Intolerant of Trelegy    COPD (chronic obstructive pulmonary disease) (HCC)    Coronary atherosclerosis 11/24/2008   Catheterization 2006, nonobstructive coronary disease   //   nuclear 2013, low risk     Depression    Diabetes mellitus, type 2 (HCC)    Diverticula of colon    DIVERTICULOSIS OF COLON 03/23/2007   Qualifier: Diagnosis of  By: Shellia MD, Vineet     Dizziness    occasional   Dizzy spells 05/04/2011   DM (diabetes mellitus) (HCC) 03/23/2007   Qualifier: Diagnosis of  By: Shellia MD, Vineet     Dyspnea    On exertion   Ejection fraction    Essential hypertension 02/16/2007   Qualifier: Diagnosis of  By: Nicholaus CMA, Tammy     G E R D 03/23/2007   Qualifier: Diagnosis of  By: Shellia MD, Vineet     Gall bladder disease 04/28/2016   Gastritis and gastroduodenitis    GERD (gastroesophageal reflux disease)    Goiter    hx of   Headache(784.0)    migraine   Hyperlipidemia     Hypertension    Hypothyroidism    HYPOTHYROIDISM, POSTSURGICAL 06/04/2008   Qualifier: Diagnosis of  By: Kassie MD, Sean A    Left ventricular hypertrophy    Leg edema    occasional swelling   Leg swelling 12/09/2018   Myofascial pain syndrome, cervical 01/06/2018   Obesity    OBESITY 11/24/2008   Qualifier: Diagnosis of  By: Jaramillo, Luz     OBSTRUCTIVE SLEEP APNEA 02/16/2007   Auto CPAP 09/03/13 to 10/02/13 >> used on 30 of 30 nights with average 6 hrs and 3 min.  Average AHI is 3.1 with median CPAP 5 cm H2O and 95 th percentile CPAP 6 cm H2O.     Occipital neuralgia of right side 01/06/2018   OSA (obstructive sleep apnea)    uses cpap - setting of 12   Pain in joint of right hip 01/21/2018   Palpitations 01/21/2011   48 hour Holter, 2015, scattered PACs and PVCs.    Paroxysmal atrial fibrillation (HCC)    Pneumonia    PONV (postoperative nausea and vomiting)    severe after every surgery   Pulmonary hypertension, secondary    RHINITIS 07/21/2010  RUQ abdominal pain    S/P left TKA 07/27/2011   Sinus node dysfunction (HCC) 11/08/2015   Sleep apnea    Spinal stenosis of cervical region 11/07/2014   Urinary frequency 12/09/2018   UTI (urinary tract infection) 08/15/2019   per pt report   VENTRICULAR HYPERTROPHY, LEFT 11/24/2008   Qualifier: Diagnosis of  By: Justina Kos      Past Surgical History:  Procedure Laterality Date   A/V FISTULAGRAM N/A 01/26/2024   Procedure: A/V Fistulagram;  Surgeon: Serene Gaile ORN, MD;  Location: [facility];  Service: Cardiovascular;  Laterality: N/A;   ABDOMINAL HYSTERECTOMY     with appendectomy and colon polypectomy   AMPUTATION TOE Left 12/15/2019   Procedure: AMPUTATION  LEFT GREAT TOE;  Surgeon: Gretel Ozell PARAS, DPM;  Location: [facility];  Service: Podiatry;  Laterality: Left;   AMPUTATION TOE Left 01/22/2022   Procedure: AMPUTATION TOE 4th digit;  Surgeon: Tobie Franky SQUIBB, DPM;  Location: [facility];  Service: Podiatry;   Laterality: Left;   AMPUTATION TOE Left 09/04/2022   Procedure: LEFT BIG TOE AMPUTAION;  Surgeon: Gershon Donnice SAUNDERS, DPM;  Location: [facility];  Service: Podiatry;  Laterality: Left;   ANTERIOR CERVICAL DECOMP/DISCECTOMY FUSION N/A 11/07/2014   Procedure: Anterior Cervical diskectomy and fusion Cervical four-five, Cervical five-six, Cervical six-seven;  Surgeon: Arley Helling, MD;  Location: [facility];  Service: Neurosurgery;  Laterality: N/A;   APPENDECTOMY     ARTERY BIOPSY Right 02/16/2017   Procedure: BIOPSY TEMPORAL ARTERY;  Surgeon: Jama Cordella MATSU, MD;  Location: [facility];  Service: Vascular;  Laterality: Right;   ARTERY BIOPSY Left 04/16/2020   Procedure: BIOPSY TEMPORAL ARTERY;  Surgeon: Jama Cordella MATSU, MD;  Location: [facility] ORS;  Service: Vascular;  Laterality: Left;  LEFT TEMPLE   AV FISTULA PLACEMENT Left 04/13/2023   Procedure: LEFT ARM ARTERIOVENOUS (AV) FISTULA CREATION;  Surgeon: Lanis Fonda BRAVO, MD;  Location: [facility] OR;  Service: Vascular;  Laterality: Left;   BACK SURGERY     BASCILIC VEIN TRANSPOSITION Left 02/14/2024   Procedure: TRANSPOSITION,OF FISTULA;  Surgeon: Lanis Fonda BRAVO, MD;  Location: [facility] OR;  Service: Vascular;  Laterality: Left;   CARDIAC CATHETERIZATION     CATARACT EXTRACTION W/ INTRAOCULAR LENS  IMPLANT, BILATERAL Bilateral    CHOLECYSTECTOMY N/A 04/29/2016   Procedure: LAPAROSCOPIC CHOLECYSTECTOMY WITH INTRAOPERATIVE CHOLANGIOGRAM;  Surgeon: Alm Angle, MD;  Location: [facility];  Service: General;  Laterality: N/A;   EP IMPLANTABLE DEVICE N/A 11/08/2015   MRI COMPATABLE -- Procedure: Pacemaker Implant;  Surgeon: Elspeth JAYSON Sage, MD;  [facility]: [facility];  Service: Cardiovascular;  Laterality: N/A;   INCISION AND DRAINAGE ABSCESS Right 05/30/2021   Procedure: IRRIGATION AND DEBRIDEMENT OF PREPATELLA BURSA;  Surgeon: Ernie Donnice, MD;  Location: [facility];  Service: Orthopedics;  Laterality: Right;  60 wants standard knee draping and pulse lavage   INCISION  AND DRAINAGE ABSCESS Right 07/22/2021   Procedure: INCISION AND DRAINAGE ABSCESS RIGHT KNEE;  Surgeon: Ernie Donnice, MD;  Location: [facility];  Service: Orthopedics;  Laterality: Right;   INSERT / REPLACE / REMOVE PACEMAKER  2017   Dr. [facility])   IR FLUORO GUIDE CV LINE RIGHT  07/25/2021   IR FLUORO GUIDE CV LINE RIGHT  11/04/2022   IR REMOVAL TUN CV CATH W/O FL  09/04/2021   IR US  GUIDE VASC ACCESS RIGHT  07/25/2021   IR US  GUIDE VASC ACCESS RIGHT  11/04/2022   JOINT REPLACEMENT     LEAD  EXTRACTION N/A 09/08/2022   Procedure: LEAD EXTRACTION;  Surgeon: Waddell Danelle ORN, MD;  Location: [facility] INV[facility]IVE CV [facility];  Service: Cardiovascular;  Laterality: N/A;   LEAD EXTRACTION N/A 09/11/2022   Procedure: LEAD EXTRACTION;  Surgeon: Waddell Danelle ORN, MD;  [facility]: [facility];  Service: Cardiovascular;  Laterality: N/A;   LIGATION OF COMPETING BRANCHES OF ARTERIOVENOUS FISTULA Left 02/14/2024   Procedure: LIGATION OF COMPETING BRANCHES OF ARTERIOVENOUS FISTULA;  Surgeon: Lanis Fonda BRAVO, MD;  Location: [facility] OR;  Service: Vascular;  Laterality: Left;   LUMBAR DISC SURGERY  1990's   x 2   REVISON OF ARTERIOVENOUS FISTULA Left 02/14/2024   Procedure: REVISON OF ARTERIOVENOUS FISTULA;  Surgeon: Lanis Fonda BRAVO, MD;  Location: [facility] OR;  Service: Vascular;  Laterality: Left;   TEE WITHOUT CARDIOVERSION N/A 09/07/2022   Procedure: TRANSESOPHAGEAL ECHOCARDIOGRAM;  Surgeon: Santo Stanly LABOR, MD;  Location: [facility];  Service: Cardiovascular;  Laterality: N/A;   THYROIDECTOMY  2011   TOTAL KNEE ARTHROPLASTY  07/27/2011   Procedure: TOTAL KNEE ARTHROPLASTY;  Surgeon: Donnice JONETTA Car, MD;  Location: [facility];  Service: Orthopedics;  Laterality: Left;   UPPER ESOPHAGEAL ENDOSCOPIC ULTRASOUND (EUS) N/A 05/14/2016   Procedure: UPPER ESOPHAGEAL ENDOSCOPIC ULTRASOUND (EUS);  Surgeon: Toribio SHAUNNA Cedar, MD;  Location: [facility];  Service: Endoscopy;  Laterality: N/A;  radial only-will be done mod if needed     Allergies  Allergen Reactions   Oxycodone  Itching and Other (See Comments)    Pt still takes this med even thought it causes itching   Trelegy Ellipta  [Fluticasone -Umeclidin-Vilant] Nausea And Vomiting, Other (See Comments) and Cough    Made the patient sick to her stomach (only) several days after using it    Trulicity [Dulaglutide] Nausea And Vomiting, Nausea Only, Other (See Comments) and Cough    TRULICITY made the patient sick to her stomach (only) several days after using it     RT great toe 02/23/2024  Physical Exam: General: The patient is alert and oriented x3 in no acute distress.  Dermatology: Skin is warm, dry and supple bilateral lower extremities.  No open wounds or lacerations  Vascular: Palpable pedal pulses bilaterally.  The ecchymosis and edema around the right great toe has improved significantly.  He continues to be some mild erythema but overall significant improvement  Neurological: Light touch and protective threshold absent  Musculoskeletal Exam: Prior history of amputation to the left great toe.  Radiographic Exam RT foot 02/08/2024:  Comminuted mildly displaced fracture noted along the base of the proximal phalanx right great toe with chronic arthritis around the sesamoids and diffusely throughout the foot  Assessment/Plan of Care: 1.  Closed displaced intra-articular fracture of the proximal phalanx right great toe.  DOI: 02/04/2024  -Patient evaluated.   -Completed the oral doxycycline  prescribed 02/07/2024 x 10 days. -Continue minimal walking/weightbearing to the foot -Return to clinic 3 weeks follow-up x-ray   Thresa EMERSON Sar, DPM [facility]  Dr. Thresa EMERSON Sar, DPM    [address] [place], [place] [address]                Office [phone]  Fax [phone]

## 2024-02-29 DIAGNOSIS — E113213 Type 2 diabetes mellitus with mild nonproliferative diabetic retinopathy with macular edema, bilateral: Secondary | ICD-10-CM | POA: Diagnosis not present

## 2024-03-05 ENCOUNTER — Other Ambulatory Visit: Payer: Self-pay | Admitting: Cardiology

## 2024-03-07 NOTE — Progress Notes (Deleted)
 POST OPERATIVE OFFICE NOTE    CC:  F/u for surgery  HPI:  This is a 83 y.o. female who is s/p *** on *** by Dr. FERNAND    Pt returns today for follow up.  Pt states ***   Allergies  Allergen Reactions   Oxycodone  Itching and Other (See Comments)    Pt still takes this med even thought it causes itching   Trelegy Ellipta  [Fluticasone -Umeclidin-Vilant] Nausea And Vomiting, Other (See Comments) and Cough    Made the patient sick to her stomach (only) several days after using it    Trulicity [Dulaglutide] Nausea And Vomiting, Nausea Only, Other (See Comments) and Cough    TRULICITY made the patient sick to her stomach (only) several days after using it    Current Outpatient Medications  Medication Sig Dispense Refill   acetaminophen  (TYLENOL ) 500 MG tablet Take 1,000-1,500 mg by mouth every 8 (eight) hours as needed for mild pain, moderate pain or headache.     Acetaminophen -DM (CORICIDIN HBP COLD/COUGH/FLU) 650-20 MG/30ML LIQD Take 30 mLs by mouth daily as needed (for cough).     albuterol  (PROVENTIL ) (2.5 MG/3ML) 0.083% nebulizer solution Take 3 mLs (2.5 mg total) by nebulization every 2 (two) hours as needed for wheezing or shortness of breath. 75 mL 5   albuterol  (VENTOLIN  HFA) 108 (90 Base) MCG/ACT inhaler Inhale 2 puffs into the lungs every 6 (six) hours as needed for wheezing or shortness of breath. 3 each 3   Alcohol  Swabs  (B-D SINGLE USE SWABS  REGULAR) PADS      amLODipine  (NORVASC ) 2.5 MG tablet Take 2.5 mg by mouth every morning.     amLODipine  (NORVASC ) 5 MG tablet Take 1 tablet (5 mg total) by mouth daily. (Patient taking differently: Take 5 mg by mouth in the morning.)     ascorbic acid  (VITAMIN C ) 1000 MG tablet Take 1,000 mg by mouth daily with lunch.     atorvastatin  (LIPITOR) 40 MG tablet Take 1 tablet (40 mg total) by mouth daily. 90 tablet 3   benzonatate  (TESSALON ) 100 MG capsule Take 1 capsule (100 mg total) by mouth 3 (three) times daily. 20 capsule 0   Blood  Glucose Calibration (TRUE METRIX LEVEL 1) Low SOLN      budesonide -glycopyrrolate -formoterol  (BREZTRI  AEROSPHERE) 160-9-4.8 MCG/ACT AERO inhaler Inhale 2 puffs into the lungs in the morning and at bedtime. 10.7 g 0   Carboxymethylcellulose Sodium (ARTIFICIAL TEARS OP) Place 1 drop into both eyes daily as needed (dry eyes).     doxycycline  (VIBRA -TABS) 100 MG tablet Take 1 tablet (100 mg total) by mouth 2 (two) times daily. 20 tablet 0   ELIQUIS  2.5 MG TABS tablet TAKE 1 TABLET BY MOUTH TWICE  DAILY 200 tablet 2   escitalopram  (LEXAPRO ) 10 MG tablet Take 10 mg by mouth daily.     escitalopram  (LEXAPRO ) 5 MG tablet Take 1 tablet (5 mg total) by mouth daily. 30 tablet 0   ferrous sulfate  325 (65 FE) MG tablet Take 1 tablet (325 mg total) by mouth daily with breakfast. 90 tablet 3   gabapentin  (NEURONTIN ) 800 MG tablet Take 800 mg by mouth in the morning, at noon, in the evening, and at bedtime.     insulin  aspart (NOVOLOG ) 100 UNIT/ML FlexPen Inject 0-20 Units into the skin See admin instructions. Inject 0-20 units into the skin three times a day before meals as needed, per SLIDING SCALE     isosorbide  mononitrate (IMDUR ) 60 MG 24 hr tablet Take 1  tablet (60 mg total) by mouth daily. 90 tablet 3   LANTUS  100 UNIT/ML injection Inject 45 Units into the skin every morning.     levothyroxine  (SYNTHROID ) 200 MCG tablet Take 100-200 mcg by mouth See admin instructions. Take 1/2 tablet by mouth every Monday and Friday, then take 1 tablet all other days     nitroGLYCERIN  (NITROSTAT ) 0.4 MG SL tablet Place 1 tablet (0.4 mg total) under the tongue every 5 (five) minutes as needed for chest pain. 25 tablet 4   omeprazole  (PRILOSEC OTC) 20 MG tablet Take 20 mg by mouth daily.     predniSONE  (DELTASONE ) 20 MG tablet Take 2 tablets (40 mg total) by mouth daily with breakfast. 4 tablet 0   PRESCRIPTION MEDICATION CPAP- At bedtime     sevelamer  carbonate (RENVELA ) 800 MG tablet Take 1 tablet (800 mg total) by mouth  3 (three) times daily with meals. 90 tablet 0   torsemide  (DEMADEX ) 100 MG tablet Take 1 tablet (100 mg total) by mouth 2 (two) times daily. (Patient taking differently: Take 100 mg by mouth See admin instructions. Take 1 tablet by mouth twice daily, except Monday and Friday) 60 tablet 0   TRUE METRIX BLOOD GLUCOSE TEST test strip      TRUEplus Lancets 28G MISC      vitamin B-12 (CYANOCOBALAMIN ) 1000 MCG tablet Take 1,000 mcg by mouth daily with lunch.     Vitamin D3 (VITAMIN D ) 25 MCG tablet Take 1,000 Units by mouth daily with lunch.     No current facility-administered medications for this visit.     ROS:  See HPI  Physical Exam:  ***  Incision:  *** Extremities:  *** Neuro: *** Abdomen:  ***  Non invasive Vascular lab: ***   Assessment/Plan:  This is a 83 y.o. female who is s/p: ***  -***   Curry Damme, [facility] [phone]   Clinic MD:  Lanis

## 2024-03-07 NOTE — Progress Notes (Unsigned)
    Postoperative Access Visit   History of Present Illness   Kayla [name] is a 83 y.o. year old female who presents for postoperative follow-up for: Left arm brachiocephalic fistula revision, branch ligation and transposition  by Dr. Lanis on 02/14/24.  The patient's wounds are healing well.  The patient notes mild steal symptoms. She reports intermittent cramping in the left arm, usually worse at dialysis. No numbness, weakness or pain. She currently dialyzes via right internal jugular TDC on Mon and Friday at the [facility] location.   She does report increased SOB and her husband says she woke up this morning disoriented. She is on home O2 and uses O2 and CPAP at night. She says her breathing has worsened recently. She has COPD.  Physical Examination   Vitals:   03/09/24 1030  BP: (!) 168/78  Pulse: 85  Temp: 97.8 F (36.6 C)  TempSrc: Temporal  Weight: 192 lb 8 oz (87.3 kg)   Body mass index is 32.03 kg/m.  left arm Incisions are healing well, staples removed, 2+ radial pulse, hand grip is 5/5, sensation in digits is intact,palpable thrill, bruit can be auscultated     Medical Decision Making   Kayla [name] is a 83 y.o. year old female who presents s/p Left arm brachiocephalic fistula revision, branch ligation and transposition  by Dr. Lanis on 02/14/24. Her incisions are healing well. Staples removed today. Fistula has good thrill and easily palpable in left upper arm. She intermittently gets cramping in the arm, worsened at dialysis. I discussed with her that unfortunately this may occur from time to time and could get worse when using her fistula pending her volume status. She has no signs or symptoms of steal. The patient's access will be ready for use  after 03/30/14 The patient's tunneled dialysis catheter can be removed when Nephrology is comfortable with the performance of the left AV fistula Will call to get her in to see her Pulmonologist, Dr. Neda,  due to increased SOB The patient may follow up on a prn basis   Kayla Damme, PA-C [facility] [place] Office: [phone]  Clinic MD: Kayla [name]

## 2024-03-09 ENCOUNTER — Ambulatory Visit: Attending: Vascular Surgery | Admitting: Physician Assistant

## 2024-03-09 ENCOUNTER — Ambulatory Visit: Payer: Self-pay | Admitting: Pulmonary Disease

## 2024-03-09 ENCOUNTER — Encounter: Payer: Self-pay | Admitting: Physician Assistant

## 2024-03-09 VITALS — BP 168/78 | HR 85 | Temp 97.8°F | Wt 192.5 lb

## 2024-03-09 DIAGNOSIS — N186 End stage renal disease: Secondary | ICD-10-CM

## 2024-03-09 NOTE — Telephone Encounter (Signed)
 FYI Only or Action Required?: FYI only for provider: ED advised.  Patient is followed in Pulmonology for copd, last seen on 05/19/2023 by Neda Jennet LABOR, MD.  Called Nurse Triage reporting Shortness of Breath.  Symptoms began unknown.  Interventions attempted: Other: unknown.  Symptoms are: unknown.  Triage Disposition: No disposition on file.     Patient/caregiver understands and will follow disposition?: Copied from CRM [id number]. Topic: Clinical - Red Word Triage >> Mar 09, 2024 10:54 AM Kayla [name] wrote: Red Word that prompted transfer to Nurse Triage: Kayla [name] from vascular & vein specialist states pt is having really increased SOB. Reason for Disposition  [1] MODERATE difficulty breathing (e.g., speaks in phrases, SOB even at rest, pulse 100-120) AND [2] NEW-onset or WORSE than normal  Answer Assessment - Initial Assessment Questions Staff from vascular office called. Pt is there to have stables removed from newly placed fistula. They state she is having increased shortness of breath and increased disorientation. RN reviewed scheduled and advised given schedule to have pt go to to ER to be evaluated. She asked when the next appt is, RN stated tomorrow, she tried to offer that to the patient. RN again reiterated given their assessment, pt should go to the ER to be evaluated. RN asked about oxygen  saturation, she stated she wasn't clinical, she is just check out and the doctor asked her to call and schedule an appt for the patient.  She stated she was going to let the doctor know Rn recommendations.      1. RESPIRATORY STATUS: Describe your breathing? (e.g., wheezing, shortness of breath, unable to speak, severe coughing)      Per staff shortness of breath 2. ONSET: When did this breathing problem begin?      unknown 3. PATTERN Does the difficult breathing come and go, or has it been constant since it started?      unknown  Protocols used: Breathing Difficulty-[name]-AH

## 2024-03-09 NOTE — Telephone Encounter (Signed)
 Noted.  Has appointment on 11/7.  Nothing further needed.

## 2024-03-10 ENCOUNTER — Ambulatory Visit

## 2024-03-10 ENCOUNTER — Other Ambulatory Visit: Payer: Self-pay

## 2024-03-10 ENCOUNTER — Emergency Department (HOSPITAL_COMMUNITY)

## 2024-03-10 ENCOUNTER — Inpatient Hospital Stay (HOSPITAL_COMMUNITY)
Admission: EM | Admit: 2024-03-10 | Discharge: 2024-03-14 | DRG: 208 | Disposition: A | Discharge status: Home Health | Attending: Internal Medicine | Admitting: Internal Medicine

## 2024-03-10 ENCOUNTER — Ambulatory Visit: Admitting: Pulmonary Disease

## 2024-03-10 VITALS — BP 128/70 | HR 85 | Temp 97.5°F | Ht 65.0 in | Wt 190.0 lb

## 2024-03-10 DIAGNOSIS — D72829 Elevated white blood cell count, unspecified: Secondary | ICD-10-CM

## 2024-03-10 DIAGNOSIS — E1122 Type 2 diabetes mellitus with diabetic chronic kidney disease: Secondary | ICD-10-CM | POA: Diagnosis present

## 2024-03-10 DIAGNOSIS — I132 Hypertensive heart and chronic kidney disease with heart failure and with stage 5 chronic kidney disease, or end stage renal disease: Secondary | ICD-10-CM | POA: Diagnosis present

## 2024-03-10 DIAGNOSIS — I6521 Occlusion and stenosis of right carotid artery: Secondary | ICD-10-CM | POA: Diagnosis present

## 2024-03-10 DIAGNOSIS — J9600 Acute respiratory failure, unspecified whether with hypoxia or hypercapnia: Secondary | ICD-10-CM | POA: Diagnosis present

## 2024-03-10 DIAGNOSIS — D631 Anemia in chronic kidney disease: Secondary | ICD-10-CM | POA: Diagnosis present

## 2024-03-10 DIAGNOSIS — S27321A Contusion of lung, unilateral, initial encounter: Secondary | ICD-10-CM | POA: Diagnosis present

## 2024-03-10 DIAGNOSIS — I480 Paroxysmal atrial fibrillation: Secondary | ICD-10-CM | POA: Diagnosis present

## 2024-03-10 DIAGNOSIS — I495 Sick sinus syndrome: Secondary | ICD-10-CM | POA: Diagnosis present

## 2024-03-10 DIAGNOSIS — J9621 Acute and chronic respiratory failure with hypoxia: Principal | ICD-10-CM

## 2024-03-10 DIAGNOSIS — E872 Acidosis, unspecified: Secondary | ICD-10-CM | POA: Diagnosis present

## 2024-03-10 DIAGNOSIS — N2581 Secondary hyperparathyroidism of renal origin: Secondary | ICD-10-CM | POA: Diagnosis present

## 2024-03-10 DIAGNOSIS — R680 Hypothermia, not associated with low environmental temperature: Secondary | ICD-10-CM | POA: Diagnosis present

## 2024-03-10 DIAGNOSIS — Z950 Presence of cardiac pacemaker: Secondary | ICD-10-CM

## 2024-03-10 DIAGNOSIS — E785 Hyperlipidemia, unspecified: Secondary | ICD-10-CM | POA: Diagnosis present

## 2024-03-10 DIAGNOSIS — S2231XA Fracture of one rib, right side, initial encounter for closed fracture: Secondary | ICD-10-CM

## 2024-03-10 DIAGNOSIS — E8729 Other acidosis: Secondary | ICD-10-CM | POA: Diagnosis not present

## 2024-03-10 DIAGNOSIS — Z992 Dependence on renal dialysis: Secondary | ICD-10-CM | POA: Diagnosis not present

## 2024-03-10 DIAGNOSIS — J9622 Acute and chronic respiratory failure with hypercapnia: Secondary | ICD-10-CM | POA: Diagnosis present

## 2024-03-10 DIAGNOSIS — T07XXXA Unspecified multiple injuries, initial encounter: Principal | ICD-10-CM

## 2024-03-10 DIAGNOSIS — N186 End stage renal disease: Secondary | ICD-10-CM | POA: Diagnosis present

## 2024-03-10 DIAGNOSIS — S2239XA Fracture of one rib, unspecified side, initial encounter for closed fracture: Secondary | ICD-10-CM

## 2024-03-10 DIAGNOSIS — I2510 Atherosclerotic heart disease of native coronary artery without angina pectoris: Secondary | ICD-10-CM | POA: Diagnosis present

## 2024-03-10 DIAGNOSIS — R0602 Shortness of breath: Secondary | ICD-10-CM | POA: Diagnosis not present

## 2024-03-10 DIAGNOSIS — Y92410 Unspecified street and highway as the place of occurrence of the external cause: Secondary | ICD-10-CM

## 2024-03-10 DIAGNOSIS — J449 Chronic obstructive pulmonary disease, unspecified: Secondary | ICD-10-CM | POA: Diagnosis present

## 2024-03-10 DIAGNOSIS — G4733 Obstructive sleep apnea (adult) (pediatric): Secondary | ICD-10-CM | POA: Diagnosis present

## 2024-03-10 DIAGNOSIS — E1151 Type 2 diabetes mellitus with diabetic peripheral angiopathy without gangrene: Secondary | ICD-10-CM | POA: Diagnosis present

## 2024-03-10 DIAGNOSIS — Z23 Encounter for immunization: Secondary | ICD-10-CM | POA: Diagnosis present

## 2024-03-10 DIAGNOSIS — J90 Pleural effusion, not elsewhere classified: Secondary | ICD-10-CM

## 2024-03-10 DIAGNOSIS — J918 Pleural effusion in other conditions classified elsewhere: Secondary | ICD-10-CM | POA: Diagnosis present

## 2024-03-10 DIAGNOSIS — I5032 Chronic diastolic (congestive) heart failure: Secondary | ICD-10-CM | POA: Diagnosis present

## 2024-03-10 DIAGNOSIS — Z7901 Long term (current) use of anticoagulants: Secondary | ICD-10-CM

## 2024-03-10 DIAGNOSIS — F419 Anxiety disorder, unspecified: Secondary | ICD-10-CM | POA: Diagnosis present

## 2024-03-10 DIAGNOSIS — R000 Tachycardia, unspecified: Secondary | ICD-10-CM | POA: Diagnosis present

## 2024-03-10 DIAGNOSIS — R4182 Altered mental status, unspecified: Secondary | ICD-10-CM

## 2024-03-10 DIAGNOSIS — Z9981 Dependence on supplemental oxygen: Secondary | ICD-10-CM

## 2024-03-10 DIAGNOSIS — Z888 Allergy status to other drugs, medicaments and biological substances status: Secondary | ICD-10-CM

## 2024-03-10 DIAGNOSIS — Z89422 Acquired absence of other left toe(s): Secondary | ICD-10-CM

## 2024-03-10 LAB — I-STAT CHEM 8, ED
BUN: 57 mg/dL — ABNORMAL HIGH (ref 8–23)
Calcium, Ion: 1.06 mmol/L — ABNORMAL LOW (ref 1.15–1.40)
Chloride: 103 mmol/L (ref 98–111)
Creatinine, Ser: 3.8 mg/dL — ABNORMAL HIGH (ref 0.44–1.00)
Glucose, Bld: 202 mg/dL — ABNORMAL HIGH (ref 70–99)
HCT: 34 % — ABNORMAL LOW (ref 36.0–46.0)
Hemoglobin: 11.6 g/dL — ABNORMAL LOW (ref 12.0–15.0)
Potassium: 4.5 mmol/L (ref 3.5–5.1)
Sodium: 138 mmol/L (ref 135–145)
TCO2: 24 mmol/L (ref 22–32)

## 2024-03-10 LAB — GLUCOSE, CAPILLARY
Glucose-Capillary: 115 mg/dL — ABNORMAL HIGH (ref 70–99)
Glucose-Capillary: 29 mg/dL — CL (ref 70–99)
Glucose-Capillary: 31 mg/dL — CL (ref 70–99)
Glucose-Capillary: 52 mg/dL — ABNORMAL LOW (ref 70–99)
Glucose-Capillary: 79 mg/dL (ref 70–99)

## 2024-03-10 LAB — I-STAT CG4 LACTIC ACID, ED: Lactic Acid, Venous: 3.3 mmol/L (ref 0.5–1.9)

## 2024-03-10 LAB — COMPREHENSIVE METABOLIC PANEL WITH GFR
ALT: 17 U/L (ref 0–44)
AST: 33 U/L (ref 15–41)
Albumin: 2.7 g/dL — ABNORMAL LOW (ref 3.5–5.0)
Alkaline Phosphatase: 60 U/L (ref 38–126)
Anion gap: 16 — ABNORMAL HIGH (ref 5–15)
BUN: 57 mg/dL — ABNORMAL HIGH (ref 8–23)
CO2: 22 mmol/L (ref 22–32)
Calcium: 7.8 mg/dL — ABNORMAL LOW (ref 8.9–10.3)
Chloride: 100 mmol/L (ref 98–111)
Creatinine, Ser: 3.89 mg/dL — ABNORMAL HIGH (ref 0.44–1.00)
GFR, Estimated: 11 mL/min — ABNORMAL LOW (ref >60)
Glucose, Bld: 209 mg/dL — ABNORMAL HIGH (ref 70–99)
Potassium: 4.4 mmol/L (ref 3.5–5.1)
Sodium: 138 mmol/L (ref 135–145)
Total Bilirubin: 0.4 mg/dL (ref 0.0–1.2)
Total Protein: 6 g/dL — ABNORMAL LOW (ref 6.5–8.1)

## 2024-03-10 LAB — URINALYSIS, ROUTINE W REFLEX MICROSCOPIC
Bilirubin Urine: NEGATIVE
Glucose, UA: >=500 mg/dL — AB
Hgb urine dipstick: NEGATIVE
Ketones, ur: NEGATIVE mg/dL
Leukocytes,Ua: NEGATIVE
Nitrite: NEGATIVE
Protein, ur: >=300 mg/dL — AB
Specific Gravity, Urine: 1.028 (ref 1.005–1.030)
pH: 5 (ref 5.0–8.0)

## 2024-03-10 LAB — MRSA NEXT GEN BY PCR, NASAL: MRSA by PCR Next Gen: DETECTED — AB

## 2024-03-10 LAB — POCT I-STAT 7, (LYTES, BLD GAS, ICA,H+H)
Acid-base deficit: 1 mmol/L (ref 0.0–2.0)
Bicarbonate: 27.2 mmol/L (ref 20.0–28.0)
Calcium, Ion: 1.16 mmol/L (ref 1.15–1.40)
HCT: 31 % — ABNORMAL LOW (ref 36.0–46.0)
Hemoglobin: 10.5 g/dL — ABNORMAL LOW (ref 12.0–15.0)
O2 Saturation: 100 %
Patient temperature: 37.7
Potassium: 4.3 mmol/L (ref 3.5–5.1)
Sodium: 141 mmol/L (ref 135–145)
TCO2: 29 mmol/L (ref 22–32)
pCO2 arterial: 68.5 mmHg (ref 32–48)
pH, Arterial: 7.211 — ABNORMAL LOW (ref 7.35–7.45)
pO2, Arterial: 270 mmHg — ABNORMAL HIGH (ref 83–108)

## 2024-03-10 LAB — CBC
HCT: 35.2 % — ABNORMAL LOW (ref 36.0–46.0)
Hemoglobin: 10.1 g/dL — ABNORMAL LOW (ref 12.0–15.0)
MCH: 28.8 pg (ref 26.0–34.0)
MCHC: 28.7 g/dL — ABNORMAL LOW (ref 30.0–36.0)
MCV: 100.3 fL — ABNORMAL HIGH (ref 80.0–100.0)
Platelets: 421 K/uL — ABNORMAL HIGH (ref 150–400)
RBC: 3.51 MIL/uL — ABNORMAL LOW (ref 3.87–5.11)
RDW: 16.4 % — ABNORMAL HIGH (ref 11.5–15.5)
WBC: 10.8 K/uL — ABNORMAL HIGH (ref 4.0–10.5)
nRBC: 0 % (ref 0.0–0.2)

## 2024-03-10 LAB — PROTIME-INR
INR: 1.3 — ABNORMAL HIGH (ref 0.8–1.2)
Prothrombin Time: 17.3 s — ABNORMAL HIGH (ref 11.4–15.2)

## 2024-03-10 LAB — SAMPLE TO BLOOD BANK

## 2024-03-10 LAB — ETHANOL: Alcohol, Ethyl (B): <15 mg/dL (ref <15)

## 2024-03-10 MED ORDER — OXYCODONE HCL 5 MG PO TABS
5.0000 mg | ORAL_TABLET | ORAL | Status: DC | PRN
  Administered 2024-03-10 – 2024-03-13 (×17): 5 mg via ORAL
  Filled 2024-03-10 (×18): qty 1

## 2024-03-10 MED ORDER — FENTANYL 2500MCG IN NS 250ML (10MCG/ML) PREMIX INFUSION
0.0000 ug/h | INTRAVENOUS | Status: DC
  Administered 2024-03-10: 50 ug/h via INTRAVENOUS
  Filled 2024-03-10: qty 250

## 2024-03-10 MED ORDER — FENTANYL CITRATE (PF) 50 MCG/ML IJ SOSY
50.0000 ug | PREFILLED_SYRINGE | INTRAMUSCULAR | Status: DC | PRN
  Administered 2024-03-10 – 2024-03-12 (×6): 50 ug via INTRAVENOUS
  Filled 2024-03-10 (×6): qty 1

## 2024-03-10 MED ORDER — FENTANYL CITRATE (PF) 50 MCG/ML IJ SOSY: 50.0000 ug | PREFILLED_SYRINGE | Freq: Once | INTRAMUSCULAR | Status: DC

## 2024-03-10 MED ORDER — TETANUS-DIPHTH-ACELL PERTUSSIS 5-2-15.5 LF-MCG/0.5 IM SUSP
0.5000 mL | Freq: Once | INTRAMUSCULAR | Status: AC
  Administered 2024-03-10: 0.5 mL via INTRAMUSCULAR

## 2024-03-10 MED ORDER — PREDNISONE 20 MG PO TABS: 20.0000 mg | ORAL_TABLET | Freq: Every day | ORAL | 0 refills | Status: DC

## 2024-03-10 MED ORDER — FUROSEMIDE 10 MG/ML IJ SOLN
80.0000 mg | Freq: Once | INTRAMUSCULAR | Status: AC
  Administered 2024-03-10: 80 mg via INTRAVENOUS
  Filled 2024-03-10: qty 8

## 2024-03-10 MED ORDER — IOHEXOL 350 MG/ML SOLN
100.0000 mL | Freq: Once | INTRAVENOUS | Status: AC | PRN
  Administered 2024-03-10: 75 mL via INTRAVENOUS

## 2024-03-10 MED ORDER — PROPOFOL 1000 MG/100ML IV EMUL
INTRAVENOUS | Status: AC
  Filled 2024-03-10: qty 100

## 2024-03-10 MED ORDER — FENTANYL CITRATE (PF) 50 MCG/ML IJ SOSY
PREFILLED_SYRINGE | INTRAMUSCULAR | Status: AC
  Administered 2024-03-10: 50 ug via INTRAVENOUS
  Filled 2024-03-10: qty 2

## 2024-03-10 MED ORDER — FENTANYL CITRATE (PF) 50 MCG/ML IJ SOSY: 100.0000 ug | PREFILLED_SYRINGE | Freq: Once | INTRAMUSCULAR | Status: DC

## 2024-03-10 MED ORDER — INSULIN ASPART 100 UNIT/ML IJ SOLN
0.0000 [IU] | INTRAMUSCULAR | Status: DC
  Administered 2024-03-11: 5 [IU] via SUBCUTANEOUS
  Administered 2024-03-11: 3 [IU] via SUBCUTANEOUS
  Administered 2024-03-12: 8 [IU] via SUBCUTANEOUS
  Administered 2024-03-12: 5 [IU] via SUBCUTANEOUS
  Administered 2024-03-12: 2 [IU] via SUBCUTANEOUS
  Administered 2024-03-12 – 2024-03-13 (×2): 5 [IU] via SUBCUTANEOUS
  Administered 2024-03-13: 2 [IU] via SUBCUTANEOUS
  Administered 2024-03-13: 8 [IU] via SUBCUTANEOUS
  Administered 2024-03-13: 3 [IU] via SUBCUTANEOUS
  Administered 2024-03-13: 2 [IU] via SUBCUTANEOUS
  Administered 2024-03-14: 5 [IU] via SUBCUTANEOUS
  Administered 2024-03-14: 2 [IU] via SUBCUTANEOUS
  Filled 2024-03-10: qty 8
  Filled 2024-03-10: qty 5
  Filled 2024-03-10: qty 8
  Filled 2024-03-10: qty 2
  Filled 2024-03-10 (×2): qty 5
  Filled 2024-03-10: qty 3
  Filled 2024-03-10: qty 4
  Filled 2024-03-10 (×2): qty 2

## 2024-03-10 MED ORDER — CHLORHEXIDINE GLUCONATE CLOTH 2 % EX PADS
6.0000 | MEDICATED_PAD | Freq: Every day | CUTANEOUS | Status: DC
  Administered 2024-03-10 – 2024-03-14 (×5): 6 via TOPICAL

## 2024-03-10 MED ORDER — ORAL CARE MOUTH RINSE
15.0000 mL | OROMUCOSAL | Status: DC
  Administered 2024-03-10: 15 mL via OROMUCOSAL

## 2024-03-10 MED ORDER — LIDOCAINE 5 % EX PTCH
1.0000 | MEDICATED_PATCH | CUTANEOUS | Status: DC
  Administered 2024-03-10 – 2024-03-12 (×3): 1 via TRANSDERMAL
  Filled 2024-03-10 (×3): qty 1

## 2024-03-10 MED ORDER — POLYETHYLENE GLYCOL 3350 17 G PO PACK: 17.0000 g | PACK | Freq: Every day | ORAL | Status: DC | PRN

## 2024-03-10 MED ORDER — ORAL CARE MOUTH RINSE: 15.0000 mL | OROMUCOSAL | Status: DC | PRN

## 2024-03-10 MED ADMIN — Ketorolac Tromethamine Inj 15 MG/ML: 15 mg | INTRAVENOUS | NDC 72266023401

## 2024-03-10 MED ADMIN — Sodium Chloride IV Soln 0.9%: 1000 mL | INTRAVENOUS | NDC 00338004910

## 2024-03-10 MED ADMIN — Fentanyl Citrate Preservative Free (PF) Inj 100 MCG/2ML: 50 ug | INTRAVENOUS

## 2024-03-10 MED FILL — Ketorolac Tromethamine Inj 15 MG/ML: 15.0000 mg | INTRAMUSCULAR | Qty: 1 | Status: AC

## 2024-03-10 NOTE — Progress Notes (Signed)
 Kayla [name]    [id number]    [birth year]  Primary Care Physician:Hamrick, Charlene CROME, MD  Referring Physician: Stephanie Charlene CROME, MD [address] [place],  [place] [address]  Chief complaint:   Patient with a history of chronic obstructive pulmonary disease, obstructive sleep apnea Being seen as an acute visit  HPI:  Complains of shortness of breath worsening over the last few days Shortness of breath with ivory activity  She feels she is congested, she has a chronic cough but feels it worse the last few days No fevers, no chills  Was recently hospitalized for shortness of breath, according to her spouse, she had had a chest x-ray showing fluid around the lungs, they attempted to drain it but did not find enough fluid  Denies any leg swelling  She is on oxygen  supplementation around-the-clock and uses oxygen  regularly  She does have sleep apnea, uses CPAP regularly Wakes up feeling rested Wakes up a couple of times during the night   She is on dialysis  Compliant with inhalers-Breztri  Compliant with albuterol  use  She does have chronic pain, ambulates with a walker Follows up with pain management service  She is a reformed smoker, history of chronic obstructive pulmonary disease, insulin -dependent diabetes, atrial fibrillation, diastolic heart failure, chronic kidney disease  Last polysomnogram was in 2006, AHI of 10 Titrated to a CPAP of 11  Did not tolerate Trelegy, Incruse, Spiriva  in the past, she has been tolerating Breztri  well  On 3 L of oxygen  around-the-clock   Outpatient Encounter Medications as of 03/10/2024  Medication Sig   acetaminophen  (TYLENOL ) 500 MG tablet Take 1,000-1,500 mg by mouth every 8 (eight) hours as needed for mild pain, moderate pain or headache.   albuterol  (PROVENTIL ) (2.5 MG/3ML) 0.083% nebulizer solution Take 3 mLs (2.5 mg total) by nebulization every 2 (two) hours as needed for wheezing or shortness of breath.    albuterol  (VENTOLIN  HFA) 108 (90 Base) MCG/ACT inhaler Inhale 2 puffs into the lungs every 6 (six) hours as needed for wheezing or shortness of breath.   Alcohol  Swabs  (B-D SINGLE USE SWABS  REGULAR) PADS    amLODipine  (NORVASC ) 2.5 MG tablet Take 2.5 mg by mouth every morning. (Patient taking differently: Take 2.5 mg by mouth every morning. Taking 5 and 2.5mg  in am)   amLODipine  (NORVASC ) 5 MG tablet Take 1 tablet (5 mg total) by mouth daily.   ascorbic acid  (VITAMIN C ) 1000 MG tablet Take 1,000 mg by mouth daily with lunch.   atorvastatin  (LIPITOR) 40 MG tablet TAKE 1 TABLET BY MOUTH DAILY   benzonatate  (TESSALON ) 100 MG capsule Take 1 capsule (100 mg total) by mouth 3 (three) times daily.   Blood Glucose Calibration (TRUE METRIX LEVEL 1) Low SOLN    budesonide -glycopyrrolate -formoterol  (BREZTRI  AEROSPHERE) 160-9-4.8 MCG/ACT AERO inhaler Inhale 2 puffs into the lungs in the morning and at bedtime.   ELIQUIS  2.5 MG TABS tablet TAKE 1 TABLET BY MOUTH TWICE  DAILY   escitalopram  (LEXAPRO ) 5 MG tablet Take 1 tablet (5 mg total) by mouth daily.   ferrous sulfate  325 (65 FE) MG tablet Take 1 tablet (325 mg total) by mouth daily with breakfast.   gabapentin  (NEURONTIN ) 800 MG tablet Take 800 mg by mouth in the morning, at noon, in the evening, and at bedtime.   insulin  aspart (NOVOLOG ) 100 UNIT/ML FlexPen Inject 0-20 Units into the skin See admin instructions. Inject 0-20 units into the skin three times  a day before meals as needed, per SLIDING SCALE   isosorbide  mononitrate (IMDUR ) 60 MG 24 hr tablet Take 1 tablet (60 mg total) by mouth daily.   LANTUS  100 UNIT/ML injection Inject 45 Units into the skin every morning.   levothyroxine  (SYNTHROID ) 200 MCG tablet Take 100-200 mcg by mouth See admin instructions. Take 1/2 tablet by mouth every Monday and Friday, then take 1 tablet all other days   nitroGLYCERIN  (NITROSTAT ) 0.4 MG SL tablet Place 1 tablet (0.4 mg total) under the tongue every 5 (five)  minutes as needed for chest pain.   omeprazole  (PRILOSEC OTC) 20 MG tablet Take 20 mg by mouth daily.   predniSONE  (DELTASONE ) 20 MG tablet Take 1 tablet (20 mg total) by mouth daily with breakfast.   PRESCRIPTION MEDICATION CPAP- At bedtime   TRUE METRIX BLOOD GLUCOSE TEST test strip    TRUEplus Lancets 28G MISC    vitamin B-12 (CYANOCOBALAMIN ) 1000 MCG tablet Take 1,000 mcg by mouth daily with lunch.   Vitamin D3 (VITAMIN D ) 25 MCG tablet Take 1,000 Units by mouth daily with lunch.   Acetaminophen -DM (CORICIDIN HBP COLD/COUGH/FLU) 650-20 MG/30ML LIQD Take 30 mLs by mouth daily as needed (for cough).   Carboxymethylcellulose Sodium (ARTIFICIAL TEARS OP) Place 1 drop into both eyes daily as needed (dry eyes).   doxycycline  (VIBRA -TABS) 100 MG tablet Take 1 tablet (100 mg total) by mouth 2 (two) times daily. (Patient not taking: Reported on 03/10/2024)   escitalopram  (LEXAPRO ) 10 MG tablet Take 10 mg by mouth daily. (Patient not taking: Reported on 03/10/2024)   sevelamer  carbonate (RENVELA ) 800 MG tablet Take 1 tablet (800 mg total) by mouth 3 (three) times daily with meals.   torsemide  (DEMADEX ) 100 MG tablet Take 1 tablet (100 mg total) by mouth 2 (two) times daily. (Patient taking differently: Take 100 mg by mouth See admin instructions. Take 1 tablet by mouth twice daily, except Monday and Friday)   [DISCONTINUED] predniSONE  (DELTASONE ) 20 MG tablet Take 2 tablets (40 mg total) by mouth daily with breakfast. (Patient not taking: Reported on 03/10/2024)   No facility-administered encounter medications on file as of 03/10/2024.    Allergies as of 03/10/2024 - Review Complete 03/09/2024  Allergen Reaction Noted   Oxycodone  Itching and Other (See Comments) 01/30/2021   Trelegy ellipta  [fluticasone -umeclidin-vilant] Nausea And Vomiting, Other (See Comments), and Cough 03/18/2021   Trulicity [dulaglutide] Nausea And Vomiting, Nausea Only, Other (See Comments), and Cough 03/16/2019    Past Medical  History:  Diagnosis Date   Allergy    Anxiety    Arthritis    CAD (coronary artery disease)    Mild per remote cath in 2006, /    Nuclear, January, 2013, no significant abnormality,   Carotid artery disease    Doppler, January, 2013, 0 39% RIC A., 40-59% LICA, stable   Cataract    CHF (congestive heart failure) (HCC)    Chronic kidney disease    dialysis Mon Friday   Complication of anesthesia    wakes up mean   COPD 02/16/2007   Intolerant of Spiriva , tudorza Intolerant of Trelegy    COPD (chronic obstructive pulmonary disease) (HCC)    Coronary atherosclerosis 11/24/2008   Catheterization 2006, nonobstructive coronary disease   //   nuclear 2013, low risk     Depression    Diabetes mellitus, type 2 (HCC)    Diverticula of colon    DIVERTICULOSIS OF COLON 03/23/2007   Qualifier: Diagnosis of  By: Shellia MD,  Vineet     Dizziness    occasional   Dizzy spells 05/04/2011   DM (diabetes mellitus) (HCC) 03/23/2007   Qualifier: Diagnosis of  By: Shellia MD, Vineet     Dyspnea    On exertion   Ejection fraction    Essential hypertension 02/16/2007   Qualifier: Diagnosis of  By: Nicholaus CMA, Tammy     G E R D 03/23/2007   Qualifier: Diagnosis of  By: Shellia MD, Vineet     Gall bladder disease 04/28/2016   Gastritis and gastroduodenitis    GERD (gastroesophageal reflux disease)    Goiter    hx of   Headache(784.0)    migraine   Hyperlipidemia    Hypertension    Hypothyroidism    HYPOTHYROIDISM, POSTSURGICAL 06/04/2008   Qualifier: Diagnosis of  By: Kassie MD, Sean A    Left ventricular hypertrophy    Leg edema    occasional swelling   Leg swelling 12/09/2018   Myofascial pain syndrome, cervical 01/06/2018   Obesity    OBESITY 11/24/2008   Qualifier: Diagnosis of  By: Jaramillo, Luz     OBSTRUCTIVE SLEEP APNEA 02/16/2007   Auto CPAP 09/03/13 to 10/02/13 >> used on 30 of 30 nights with average 6 hrs and 3 min.  Average AHI is 3.1 with median CPAP 5 cm H2O and 95 th percentile  CPAP 6 cm H2O.     Occipital neuralgia of right side 01/06/2018   OSA (obstructive sleep apnea)    uses cpap - setting of 12   Pain in joint of right hip 01/21/2018   Palpitations 01/21/2011   48 hour Holter, 2015, scattered PACs and PVCs.    Paroxysmal atrial fibrillation (HCC)    Pneumonia    PONV (postoperative nausea and vomiting)    severe after every surgery   Pulmonary hypertension, secondary    RHINITIS 07/21/2010        RUQ abdominal pain    S/P left TKA 07/27/2011   Sinus node dysfunction (HCC) 11/08/2015   Sleep apnea    Spinal stenosis of cervical region 11/07/2014   Urinary frequency 12/09/2018   UTI (urinary tract infection) 08/15/2019   per pt report   VENTRICULAR HYPERTROPHY, LEFT 11/24/2008   Qualifier: Diagnosis of  By: Justina Kos      Past Surgical History:  Procedure Laterality Date   A/V FISTULAGRAM N/A 01/26/2024   Procedure: A/V Fistulagram;  Surgeon: Serene Gaile ORN, MD;  Location: [facility];  Service: Cardiovascular;  Laterality: N/A;   ABDOMINAL HYSTERECTOMY     with appendectomy and colon polypectomy   AMPUTATION TOE Left 12/15/2019   Procedure: AMPUTATION  LEFT GREAT TOE;  Surgeon: Gretel Ozell PARAS, DPM;  Location: [facility];  Service: Podiatry;  Laterality: Left;   AMPUTATION TOE Left 01/22/2022   Procedure: AMPUTATION TOE 4th digit;  Surgeon: Tobie Franky SQUIBB, DPM;  Location: [facility];  Service: Podiatry;  Laterality: Left;   AMPUTATION TOE Left 09/04/2022   Procedure: LEFT BIG TOE AMPUTAION;  Surgeon: Gershon Donnice SAUNDERS, DPM;  Location: [facility];  Service: Podiatry;  Laterality: Left;   ANTERIOR CERVICAL DECOMP/DISCECTOMY FUSION N/A 11/07/2014   Procedure: Anterior Cervical diskectomy and fusion Cervical four-five, Cervical five-six, Cervical six-seven;  Surgeon: Arley Helling, MD;  Location: [facility];  Service: Neurosurgery;  Laterality: N/A;   APPENDECTOMY     ARTERY BIOPSY Right 02/16/2017   Procedure: BIOPSY TEMPORAL ARTERY;  Surgeon: Jama Cordella MATSU, MD;  Location: [facility];  Service: Vascular;  Laterality: Right;   ARTERY BIOPSY Left 04/16/2020   Procedure: BIOPSY TEMPORAL ARTERY;  Surgeon: Jama Cordella MATSU, MD;  Location: [facility];  Service: Vascular;  Laterality: Left;  LEFT TEMPLE   AV FISTULA PLACEMENT Left 04/13/2023   Procedure: LEFT ARM ARTERIOVENOUS (AV) FISTULA CREATION;  Surgeon: Lanis Fonda BRAVO, MD;  Location: [facility] OR;  Service: Vascular;  Laterality: Left;   BACK SURGERY     BASCILIC VEIN TRANSPOSITION Left 02/14/2024   Procedure: TRANSPOSITION,OF FISTULA;  Surgeon: Lanis Fonda BRAVO, MD;  Location: [facility] OR;  Service: Vascular;  Laterality: Left;   CARDIAC CATHETERIZATION     CATARACT EXTRACTION W/ INTRAOCULAR LENS  IMPLANT, BILATERAL Bilateral    CHOLECYSTECTOMY N/A 04/29/2016   Procedure: LAPAROSCOPIC CHOLECYSTECTOMY WITH INTRAOPERATIVE CHOLANGIOGRAM;  Surgeon: Alm Angle, MD;  Location: [facility];  Service: General;  Laterality: N/A;   EP IMPLANTABLE DEVICE N/A 11/08/2015   MRI COMPATABLE -- Procedure: Pacemaker Implant;  Surgeon: Elspeth JAYSON Sage, MD;  [facility];  Service: Cardiovascular;  Laterality: N/A;   INCISION AND DRAINAGE ABSCESS Right 05/30/2021   Procedure: IRRIGATION AND DEBRIDEMENT OF PREPATELLA BURSA;  Surgeon: Ernie Cough, MD;  Location: [facility];  Service: Orthopedics;  Laterality: Right;  60 wants standard knee draping and pulse lavage   INCISION AND DRAINAGE ABSCESS Right 07/22/2021   Procedure: INCISION AND DRAINAGE ABSCESS RIGHT KNEE;  Surgeon: Ernie Cough, MD;  Location: [facility];  Service: Orthopedics;  Laterality: Right;   INSERT / REPLACE / REMOVE PACEMAKER  2017   Dr. Beverli Tug [facility])   IR FLUORO GUIDE CV LINE RIGHT  07/25/2021   IR FLUORO GUIDE CV LINE RIGHT  11/04/2022   IR REMOVAL TUN CV CATH W/O FL  09/04/2021   IR US  GUIDE VASC ACCESS RIGHT  07/25/2021   IR US  GUIDE VASC ACCESS RIGHT  11/04/2022   JOINT REPLACEMENT     LEAD EXTRACTION N/A 09/08/2022   Procedure: LEAD EXTRACTION;   Surgeon: Waddell Danelle ORN, MD;  [facility];  Service: Cardiovascular;  Laterality: N/A;   LEAD EXTRACTION N/A 09/11/2022   Procedure: LEAD EXTRACTION;  Surgeon: Waddell Danelle ORN, MD;  [facility];  Service: Cardiovascular;  Laterality: N/A;   LIGATION OF COMPETING BRANCHES OF ARTERIOVENOUS FISTULA Left 02/14/2024   Procedure: LIGATION OF COMPETING BRANCHES OF ARTERIOVENOUS FISTULA;  Surgeon: Lanis Fonda BRAVO, MD;  Location: [facility] OR;  Service: Vascular;  Laterality: Left;   LUMBAR DISC SURGERY  1990's   x 2   REVISON OF ARTERIOVENOUS FISTULA Left 02/14/2024   Procedure: REVISON OF ARTERIOVENOUS FISTULA;  Surgeon: Lanis Fonda BRAVO, MD;  Location: [facility] OR;  Service: Vascular;  Laterality: Left;   TEE WITHOUT CARDIOVERSION N/A 09/07/2022   Procedure: TRANSESOPHAGEAL ECHOCARDIOGRAM;  Surgeon: Santo Stanly LABOR, MD;  [facility];  Service: Cardiovascular;  Laterality: N/A;   THYROIDECTOMY  2011   TOTAL KNEE ARTHROPLASTY  07/27/2011   Procedure: TOTAL KNEE ARTHROPLASTY;  Surgeon: Cough JONETTA Ernie, MD;  Location: [facility];  Service: Orthopedics;  Laterality: Left;   UPPER ESOPHAGEAL ENDOSCOPIC ULTRASOUND (EUS) N/A 05/14/2016   Procedure: UPPER ESOPHAGEAL ENDOSCOPIC ULTRASOUND (EUS);  Surgeon: Toribio SHAUNNA Cedar, MD;  Location: [facility];  Service: Endoscopy;  Laterality: N/A;  radial only-will be done mod if needed    Family History  Problem Relation Age of Onset   Heart Problems Mother    Heart defect Mother        valve problems  Heart Problems Father    Heart disease Brother    Lung cancer Daughter        non small cell    Diabetes Maternal Aunt    Diabetes Maternal Uncle    Throat cancer Brother    Colon cancer Neg Hx    Colon polyps Neg Hx    Esophageal cancer Neg Hx    Rectal cancer Neg Hx    Stomach cancer Neg Hx     Social History   Socioeconomic History   Marital status: Married    Spouse name: Jerita Wimbush   Number of children: 3    Years of education: 12   Highest education level: Not on file  Occupational History   Occupation: retired  Tobacco Use   Smoking status: Former    Current packs/day: 0.00    Average packs/day: 2.0 packs/day for 33.0 years (66.0 ttl pk-yrs)    Types: Cigarettes    Start date: 1966    Quit date: 05/04/1997    Years since quitting: 26.8   Smokeless tobacco: Never  Vaping Use   Vaping status: Never Used  Substance and Sexual Activity   Alcohol  use: No   Drug use: No   Sexual activity: Yes  Other Topics Concern   Not on file  Social History Narrative   Patient lives at home with her husband Lynwood .   Retired.   Education 12 th grade.   Right handed   Caffeine two cups of coffee.   Social Drivers of Corporate Investment Banker Strain: Low Risk  (01/31/2021)   Overall Financial Resource Strain (CARDIA)    Difficulty of Paying Living Expenses: Not very hard  Food Insecurity: No Food Insecurity (01/19/2024)   Hunger Vital Sign    Worried About Running Out of Food in the Last Year: Never true    Ran Out of Food in the Last Year: Never true  Transportation Needs: No Transportation Needs (01/19/2024)   PRAPARE - Administrator, Civil Service (Medical): No    Lack of Transportation (Non-Medical): No  Physical Activity: Insufficiently Active (09/30/2021)   Exercise Vital Sign    Days of Exercise per Week: 5 days    Minutes of Exercise per Session: 20 min  Stress: No Stress Concern Present (09/30/2021)   [employer] of Occupational Health - Occupational Stress Questionnaire    Feeling of Stress : Not at all  Social Connections: Moderately Integrated (01/19/2024)   Social Connection and Isolation Panel    Frequency of Communication with Friends and Family: More than three times a week    Frequency of Social Gatherings with Friends and Family: More than three times a week    Attends Religious Services: More than 4 times per year    Active Member of [employer] or  Organizations: No    Attends Banker Meetings: Never    Marital Status: Married  Catering Manager Violence: Not At Risk (01/19/2024)   Humiliation, Afraid, Rape, and Kick questionnaire    Fear of Current or Ex-Partner: No    Emotionally Abused: No    Physically Abused: No    Sexually Abused: No    Review of Systems  Respiratory:  Positive for apnea, cough and shortness of breath.   Psychiatric/Behavioral:  Positive for sleep disturbance.     Vitals:   03/10/24 1015  BP: 128/70  Pulse: 85  Temp: (!) 97.5 F (36.4 C)  SpO2: 97%     Physical Exam  Constitutional:      Appearance: She is obese.  HENT:     Head: Normocephalic and atraumatic.     Mouth/Throat:     Mouth: Mucous membranes are moist.  Eyes:     General: No scleral icterus.    Extraocular Movements: Extraocular movements intact.     Pupils: Pupils are equal, round, and reactive to light.  Cardiovascular:     Rate and Rhythm: Normal rate and regular rhythm.     Heart sounds: No murmur heard.    No friction rub.  Pulmonary:     Effort: No respiratory distress.     Breath sounds: Normal breath sounds. No stridor. No wheezing or rhonchi.     Comments: Decreased air movement at the bases bilaterally worse at the right base Musculoskeletal:     Cervical back: No rigidity or tenderness.  Neurological:     Mental Status: She is alert.  Psychiatric:        Mood and Affect: Mood normal.    Data Reviewed:  CPAP compliance report not available today but has been using CPAP regularly Last compliance report shows excellent compliance on a CPAP of 12 with a residual AHI 1.9  Most recent echocardiogram was in 2023 showing an ejection fraction of 60 to 65% Compliance reviewed over the last 30 days showing 90% compliance Average use of 7 hours 4 minutes AutoSet 5-15 95 percentile pressure of 12 Residual AHI of 1.9  Echocardiogram 02/11/2022 With ejection fraction of 60 to 65%  Echocardiogram  01/19/2024 - Ejection fraction of 55 to 60%, grade 2 diastolic dysfunction elevated left ventricular end-diastolic pressure  Assessment:   Obstructive lung disease with exacerbation - She does not appear infected at the present time Has not had any fevers or chills, clear phlegm Does feel congested and wheezy sometimes - Will treat with a course of steroids  She will continue Breztri , continue albuterol   Mild obstructive sleep apnea - Encouraged to continue using CPAP on a nightly basis  Chronic respiratory failure  Concern for worsening pleural effusion We will get a chest x-ray today to assess amount of effusion  Plan/Recommendations:  Prednisone  20 daily for 7 days  Continue Breztri , continue albuterol   Continue CPAP nightly  Will call to update patient about chest x-ray findings and if significant effusion, will plan for thoracentesis  Tentative follow-up in about 6 months  Encouraged to call with significant concerns  I spent 32 minutes Pre-charting, reviewing records, interviewing/examining patient, and managing orders.  Jennet Epley MD [facility] 03/10/2024, 10:44 AM  CC: Hamrick, Charlene CROME, MD

## 2024-03-10 NOTE — Procedures (Signed)
 Extubation Procedure Note  Patient Details:   Name: Kayla [name] DOB: 1940/05/27 MRN: [id number]   Airway Documentation:    Vent end date: 03/10/24 Vent end time: 1602   Evaluation  O2 sats: stable throughout Complications: No apparent complications Patient did tolerate procedure well. Bilateral Breath Sounds: Clear   Yes  Positive cuff leak prior to extubation. Pt placed on 3L [place].  Dena VEAR Samuel 03/10/2024, 4:08 PM
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
## 2024-03-10 NOTE — ED Notes (Signed)
 X-ray at bedside.
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
## 2024-03-10 NOTE — Patient Instructions (Signed)
 We will get a chest x-ray today  Will call in a prescription for prednisone  20 mg daily for 7 days  We will update you with the chest x-ray findings -Telephone #[phone]  If there is a lot of fluid on the chest x-ray, we will make arrangements to have the fluid drained  Follow-up in about 4 to 6 weeks  Call us  with significant concerns

## 2024-03-10 NOTE — ED Notes (Signed)
 Xray at beside

## 2024-03-10 NOTE — Progress Notes (Signed)
 ABG reviewed with RT, agree with increasing RR from 16 to 24, recheck at 4PM.  Rolan Sharps MD PCCM
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
## 2024-03-10 NOTE — ED Notes (Signed)
 Transported to CT with Lovick MD, this RN, RT & TRN.

## 2024-03-10 NOTE — ED Notes (Signed)
 CCMD called to report that the patient transfer to [id number].

## 2024-03-10 NOTE — Progress Notes (Signed)
 RT pull back ETT by 2cm from 27 to 25 @ lip per order.
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
## 2024-03-10 NOTE — ED Notes (Signed)
ICU provider at bedside
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
## 2024-03-10 NOTE — Progress Notes (Signed)
 CCM notified of critical ABG results.
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
## 2024-03-10 NOTE — ED Notes (Signed)
 Pt does have a fistula in the Lt arm & HD cath to her chest noted upon arrival to ED.

## 2024-03-10 NOTE — ED Notes (Signed)
 CCMD Called

## 2024-03-11 ENCOUNTER — Inpatient Hospital Stay (HOSPITAL_COMMUNITY)

## 2024-03-11 DIAGNOSIS — S27321A Contusion of lung, unilateral, initial encounter: Secondary | ICD-10-CM

## 2024-03-11 DIAGNOSIS — T07XXXA Unspecified multiple injuries, initial encounter: Secondary | ICD-10-CM

## 2024-03-11 LAB — GLUCOSE, CAPILLARY: Glucose-Capillary: 100 mg/dL — ABNORMAL HIGH (ref 70–99)

## 2024-03-11 MED ORDER — GABAPENTIN 100 MG PO CAPS
200.0000 mg | ORAL_CAPSULE | Freq: Three times a day (TID) | ORAL | Status: DC
  Administered 2024-03-11 (×2): 200 mg via ORAL
  Filled 2024-03-11 (×2): qty 2

## 2024-03-11 MED ORDER — APIXABAN 2.5 MG PO TABS
2.5000 mg | ORAL_TABLET | Freq: Two times a day (BID) | ORAL | Status: DC
  Administered 2024-03-11 – 2024-03-14 (×7): 2.5 mg via ORAL
  Filled 2024-03-11 (×8): qty 1

## 2024-03-11 MED ORDER — REVEFENACIN 175 MCG/3ML IN SOLN
175.0000 ug | Freq: Every day | RESPIRATORY_TRACT | Status: DC
  Administered 2024-03-11 – 2024-03-14 (×4): 175 ug via RESPIRATORY_TRACT
  Filled 2024-03-11 (×4): qty 3

## 2024-03-11 MED ORDER — ARFORMOTEROL TARTRATE 15 MCG/2ML IN NEBU
15.0000 ug | INHALATION_SOLUTION | Freq: Two times a day (BID) | RESPIRATORY_TRACT | Status: DC
  Administered 2024-03-11 – 2024-03-14 (×7): 15 ug via RESPIRATORY_TRACT
  Filled 2024-03-11 (×7): qty 2

## 2024-03-11 MED ORDER — ALTEPLASE 2 MG IJ SOLR: 2.0000 mg | Freq: Once | INTRAMUSCULAR | Status: DC | PRN

## 2024-03-11 MED ORDER — METHYLPREDNISOLONE SODIUM SUCC 125 MG IJ SOLR
80.0000 mg | INTRAMUSCULAR | Status: AC
  Administered 2024-03-11: 80 mg via INTRAVENOUS
  Filled 2024-03-11: qty 2

## 2024-03-11 MED ORDER — PREDNISONE 20 MG PO TABS
40.0000 mg | ORAL_TABLET | Freq: Every day | ORAL | Status: DC
  Administered 2024-03-12 – 2024-03-14 (×3): 40 mg via ORAL
  Filled 2024-03-11 (×4): qty 2

## 2024-03-11 MED ORDER — IPRATROPIUM-ALBUTEROL 0.5-2.5 (3) MG/3ML IN SOLN
3.0000 mL | Freq: Four times a day (QID) | RESPIRATORY_TRACT | Status: DC | PRN
  Administered 2024-03-11: 3 mL via RESPIRATORY_TRACT
  Filled 2024-03-11: qty 3

## 2024-03-11 MED ORDER — DOXYCYCLINE HYCLATE 100 MG PO TABS
100.0000 mg | ORAL_TABLET | Freq: Two times a day (BID) | ORAL | Status: DC
  Administered 2024-03-11 – 2024-03-14 (×7): 100 mg via ORAL
  Filled 2024-03-11 (×8): qty 1

## 2024-03-11 MED ORDER — HYDROMORPHONE HCL 1 MG/ML IJ SOLN
0.5000 mg | Freq: Once | INTRAMUSCULAR | Status: AC
  Administered 2024-03-11: 0.5 mg via INTRAVENOUS
  Filled 2024-03-11: qty 1

## 2024-03-11 MED ADMIN — Heparin Sodium (Porcine) Inj 1000 Unit/ML: 3200 [IU] | NDC 71288040201

## 2024-03-11 MED FILL — Heparin Sodium (Porcine) Inj 1000 Unit/ML: 1000.0000 [IU] | INTRAMUSCULAR | Qty: 1 | Status: AC

## 2024-03-12 MED ORDER — DIAZEPAM 5 MG PO TABS
5.0000 mg | ORAL_TABLET | Freq: Once | ORAL | Status: AC
  Administered 2024-03-12: 5 mg via ORAL
  Filled 2024-03-12: qty 1

## 2024-03-12 MED ORDER — HYDROMORPHONE HCL 1 MG/ML IJ SOLN
0.5000 mg | INTRAMUSCULAR | Status: DC | PRN
  Administered 2024-03-13: 1 mg via INTRAVENOUS

## 2024-03-12 MED ORDER — ACETAMINOPHEN 325 MG PO TABS
650.0000 mg | ORAL_TABLET | Freq: Four times a day (QID) | ORAL | Status: DC
  Administered 2024-03-12 – 2024-03-14 (×7): 650 mg via ORAL
  Filled 2024-03-12 (×7): qty 2

## 2024-03-12 MED ORDER — DIAZEPAM 5 MG/ML IJ SOLN: 5.0000 mg | Freq: Once | INTRAMUSCULAR | Status: DC

## 2024-03-12 MED ORDER — DIAZEPAM 5 MG PO TABS
5.0000 mg | ORAL_TABLET | Freq: Four times a day (QID) | ORAL | Status: DC | PRN
  Administered 2024-03-12 – 2024-03-13 (×2): 5 mg via ORAL
  Filled 2024-03-12 (×2): qty 1

## 2024-03-12 MED ORDER — CHLORHEXIDINE GLUCONATE CLOTH 2 % EX PADS
6.0000 | MEDICATED_PAD | Freq: Every day | CUTANEOUS | Status: DC
  Administered 2024-03-13: 6 via TOPICAL

## 2024-03-12 MED ORDER — HYDROXYZINE HCL 10 MG PO TABS
10.0000 mg | ORAL_TABLET | Freq: Three times a day (TID) | ORAL | Status: DC | PRN
  Administered 2024-03-12: 10 mg via ORAL
  Filled 2024-03-12: qty 1

## 2024-03-12 MED ORDER — GABAPENTIN 400 MG PO CAPS
400.0000 mg | ORAL_CAPSULE | Freq: Three times a day (TID) | ORAL | Status: DC
  Administered 2024-03-12 – 2024-03-14 (×7): 400 mg via ORAL
  Filled 2024-03-12 (×7): qty 1

## 2024-03-13 DIAGNOSIS — J9600 Acute respiratory failure, unspecified whether with hypoxia or hypercapnia: Secondary | ICD-10-CM

## 2024-03-13 MED ORDER — HYDROMORPHONE HCL 1 MG/ML IJ SOLN
INTRAMUSCULAR | Status: AC
  Filled 2024-03-13: qty 1

## 2024-03-13 MED ORDER — METOPROLOL TARTRATE 5 MG/5ML IV SOLN: 5.0000 mg | INTRAVENOUS | Status: DC | PRN

## 2024-03-13 MED ORDER — ANTICOAGULANT SODIUM CITRATE 4% (200MG/5ML) IV SOLN: 5.0000 mL | Status: DC | PRN

## 2024-03-13 MED ORDER — OXYCODONE HCL 5 MG PO TABS
5.0000 mg | ORAL_TABLET | ORAL | Status: DC | PRN
  Administered 2024-03-14: 5 mg via ORAL
  Administered 2024-03-14: 10 mg via ORAL
  Filled 2024-03-13: qty 1
  Filled 2024-03-13: qty 2

## 2024-03-13 MED ORDER — HYDRALAZINE HCL 20 MG/ML IJ SOLN: 10.0000 mg | INTRAMUSCULAR | Status: DC | PRN

## 2024-03-13 MED ORDER — HEPARIN SODIUM (PORCINE) 1000 UNIT/ML IJ SOLN
INTRAMUSCULAR | Status: AC
  Filled 2024-03-13: qty 6

## 2024-03-13 MED ORDER — IPRATROPIUM-ALBUTEROL 0.5-2.5 (3) MG/3ML IN SOLN: 3.0000 mL | RESPIRATORY_TRACT | Status: DC | PRN

## 2024-03-13 MED ORDER — LIDOCAINE HCL (PF) 1 % IJ SOLN: 5.0000 mL | INTRAMUSCULAR | Status: DC | PRN

## 2024-03-13 MED ORDER — LIDOCAINE 5 % EX PTCH
3.0000 | MEDICATED_PATCH | CUTANEOUS | Status: DC
  Administered 2024-03-13: 3 via TRANSDERMAL
  Filled 2024-03-13: qty 3

## 2024-03-13 MED ORDER — NEPRO/CARBSTEADY PO LIQD: 237.0000 mL | ORAL | Status: DC | PRN

## 2024-03-13 MED ORDER — LIDOCAINE-PRILOCAINE 2.5-2.5 % EX CREA: 1.0000 | TOPICAL_CREAM | CUTANEOUS | Status: DC | PRN

## 2024-03-13 MED ORDER — MUPIROCIN 2 % EX OINT
TOPICAL_OINTMENT | Freq: Two times a day (BID) | CUTANEOUS | Status: DC
  Filled 2024-03-13 (×2): qty 22

## 2024-03-13 MED ORDER — HEPARIN SODIUM (PORCINE) 1000 UNIT/ML DIALYSIS: 1000.0000 [IU] | INTRAMUSCULAR | Status: DC | PRN

## 2024-03-13 MED ADMIN — Tramadol HCl Tab 50 MG: 50 mg | ORAL | NDC 60687079511

## 2024-03-13 MED FILL — Tramadol HCl Tab 50 MG: 50.0000 mg | ORAL | Qty: 1 | Status: AC

## 2024-03-14 DIAGNOSIS — J9600 Acute respiratory failure, unspecified whether with hypoxia or hypercapnia: Secondary | ICD-10-CM | POA: Diagnosis not present

## 2024-03-14 MED ORDER — DOCUSATE SODIUM 100 MG PO CAPS
100.0000 mg | ORAL_CAPSULE | Freq: Two times a day (BID) | ORAL | 0 refills | Status: AC | PRN
  Filled 2024-03-14: qty 30, 15d supply, fill #0

## 2024-03-14 MED ORDER — TRAMADOL HCL 50 MG PO TABS
50.0000 mg | ORAL_TABLET | Freq: Three times a day (TID) | ORAL | 0 refills | Status: DC
  Filled 2024-03-14: qty 30, 10d supply, fill #0

## 2024-03-14 MED ORDER — LIDOCAINE 5 % EX PTCH
3.0000 | MEDICATED_PATCH | CUTANEOUS | 0 refills | Status: DC
  Filled 2024-03-14: qty 30, 10d supply, fill #0

## 2024-03-14 MED ORDER — DOXYCYCLINE HYCLATE 100 MG PO TABS
100.0000 mg | ORAL_TABLET | Freq: Two times a day (BID) | ORAL | 0 refills | Status: AC
  Filled 2024-03-14: qty 6, 3d supply, fill #0

## 2024-03-14 MED ORDER — PREDNISONE 20 MG PO TABS
40.0000 mg | ORAL_TABLET | Freq: Every day | ORAL | 0 refills | Status: AC
  Filled 2024-03-14: qty 6, 3d supply, fill #0

## 2024-03-14 MED ORDER — POLYETHYLENE GLYCOL 3350 17 GM/SCOOP PO POWD
17.0000 g | Freq: Two times a day (BID) | ORAL | 0 refills | Status: DC | PRN
  Filled 2024-03-14: qty 476, 14d supply, fill #0

## 2024-03-14 MED ORDER — DIAZEPAM 5 MG PO TABS
5.0000 mg | ORAL_TABLET | Freq: Three times a day (TID) | ORAL | 0 refills | Status: DC | PRN
  Filled 2024-03-14: qty 10, 4d supply, fill #0

## 2024-03-14 MED ORDER — HYDROCODONE-ACETAMINOPHEN 7.5-325 MG PO TABS
1.0000 | ORAL_TABLET | ORAL | 0 refills | Status: DC
  Filled 2024-03-14: qty 30, 10d supply, fill #0

## 2024-03-14 MED ORDER — ACETAMINOPHEN 325 MG PO TABS
650.0000 mg | ORAL_TABLET | Freq: Four times a day (QID) | ORAL | 0 refills | Status: DC
  Filled 2024-03-14: qty 80, 10d supply, fill #0

## 2024-03-14 MED ADMIN — Tramadol HCl Tab 50 MG: 50 mg | ORAL | NDC 60687079511

## 2024-03-15 ENCOUNTER — Ambulatory Visit: Admitting: Podiatry

## 2024-03-15 ENCOUNTER — Encounter: Payer: Self-pay | Admitting: Physician Assistant

## 2024-03-15 ENCOUNTER — Telehealth (HOSPITAL_COMMUNITY): Payer: Self-pay | Admitting: Nephrology
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
## 2024-03-17 NOTE — Progress Notes (Signed)
 Ultrasound ED FAST  Date/Time: 03/17/2024 11:38 PM  Performed by: Elnor Jayson LABOR, DO Authorized by: Elnor Jayson LABOR, DO  Procedure details:    Indications: blunt abdominal trauma       Assess for:  Hemothorax, pericardial effusion, pneumothorax and intra-abdominal fluid    Technique:  Abdominal, chest and cardiac    Images: archived    Study Limitations: body habitus  Abdominal findings:    L kidney:  Visualized   R kidney:  Visualized   Liver:  Visualized    Bladder:  Visualized, Foley catheter not visualized   Hepatorenal space visualized: identified     Splenorenal space: identified     Rectovesical free fluid: not identified     Splenorenal free fluid: not identified     Hepatorenal space free fluid: not identified   Cardiac findings:    Heart:  Visualized   Wall motion: identified     Pericardial effusion: not identified   Chest findings:    L lung sliding: identified     R lung sliding: identified     Fluid in thorax: not identified   Comments:     EFAST negative Hepatomegaly noted on POCUS

## 2024-03-19 ENCOUNTER — Emergency Department (HOSPITAL_COMMUNITY)

## 2024-03-19 ENCOUNTER — Inpatient Hospital Stay (HOSPITAL_COMMUNITY)
Admission: EM | Admit: 2024-03-19 | Discharge: 2024-03-24 | DRG: 091 | Disposition: A | Discharge status: Home Health | Attending: Family Medicine | Admitting: Family Medicine

## 2024-03-19 DIAGNOSIS — Z950 Presence of cardiac pacemaker: Secondary | ICD-10-CM

## 2024-03-19 DIAGNOSIS — F419 Anxiety disorder, unspecified: Secondary | ICD-10-CM | POA: Diagnosis present

## 2024-03-19 DIAGNOSIS — S2249XA Multiple fractures of ribs, unspecified side, initial encounter for closed fracture: Secondary | ICD-10-CM | POA: Diagnosis not present

## 2024-03-19 DIAGNOSIS — Z7901 Long term (current) use of anticoagulants: Secondary | ICD-10-CM

## 2024-03-19 DIAGNOSIS — T68XXXA Hypothermia, initial encounter: Secondary | ICD-10-CM

## 2024-03-19 DIAGNOSIS — E890 Postprocedural hypothyroidism: Secondary | ICD-10-CM | POA: Diagnosis present

## 2024-03-19 DIAGNOSIS — F32A Depression, unspecified: Secondary | ICD-10-CM | POA: Diagnosis present

## 2024-03-19 DIAGNOSIS — J90 Pleural effusion, not elsewhere classified: Secondary | ICD-10-CM | POA: Diagnosis not present

## 2024-03-19 DIAGNOSIS — Z9842 Cataract extraction status, left eye: Secondary | ICD-10-CM

## 2024-03-19 DIAGNOSIS — Z961 Presence of intraocular lens: Secondary | ICD-10-CM | POA: Diagnosis present

## 2024-03-19 DIAGNOSIS — Z885 Allergy status to narcotic agent status: Secondary | ICD-10-CM

## 2024-03-19 DIAGNOSIS — I495 Sick sinus syndrome: Secondary | ICD-10-CM | POA: Diagnosis present

## 2024-03-19 DIAGNOSIS — K219 Gastro-esophageal reflux disease without esophagitis: Secondary | ICD-10-CM | POA: Diagnosis present

## 2024-03-19 DIAGNOSIS — I132 Hypertensive heart and chronic kidney disease with heart failure and with stage 5 chronic kidney disease, or end stage renal disease: Secondary | ICD-10-CM | POA: Diagnosis present

## 2024-03-19 DIAGNOSIS — E785 Hyperlipidemia, unspecified: Secondary | ICD-10-CM | POA: Diagnosis present

## 2024-03-19 DIAGNOSIS — Z9981 Dependence on supplemental oxygen: Secondary | ICD-10-CM

## 2024-03-19 DIAGNOSIS — E039 Hypothyroidism, unspecified: Secondary | ICD-10-CM | POA: Diagnosis present

## 2024-03-19 DIAGNOSIS — I272 Pulmonary hypertension, unspecified: Secondary | ICD-10-CM | POA: Diagnosis present

## 2024-03-19 DIAGNOSIS — Z6831 Body mass index (BMI) 31.0-31.9, adult: Secondary | ICD-10-CM

## 2024-03-19 DIAGNOSIS — D72829 Elevated white blood cell count, unspecified: Secondary | ICD-10-CM | POA: Diagnosis present

## 2024-03-19 DIAGNOSIS — S2243XA Multiple fractures of ribs, bilateral, initial encounter for closed fracture: Secondary | ICD-10-CM

## 2024-03-19 DIAGNOSIS — A419 Sepsis, unspecified organism: Principal | ICD-10-CM

## 2024-03-19 DIAGNOSIS — G9341 Metabolic encephalopathy: Secondary | ICD-10-CM | POA: Diagnosis not present

## 2024-03-19 DIAGNOSIS — D631 Anemia in chronic kidney disease: Secondary | ICD-10-CM | POA: Diagnosis present

## 2024-03-19 DIAGNOSIS — S2241XA Multiple fractures of ribs, right side, initial encounter for closed fracture: Secondary | ICD-10-CM | POA: Diagnosis present

## 2024-03-19 DIAGNOSIS — I5033 Acute on chronic diastolic (congestive) heart failure: Secondary | ICD-10-CM | POA: Diagnosis present

## 2024-03-19 DIAGNOSIS — Z89422 Acquired absence of other left toe(s): Secondary | ICD-10-CM

## 2024-03-19 DIAGNOSIS — I5032 Chronic diastolic (congestive) heart failure: Secondary | ICD-10-CM | POA: Diagnosis present

## 2024-03-19 DIAGNOSIS — Y92410 Unspecified street and highway as the place of occurrence of the external cause: Secondary | ICD-10-CM

## 2024-03-19 DIAGNOSIS — T50915A Adverse effect of multiple unspecified drugs, medicaments and biological substances, initial encounter: Secondary | ICD-10-CM | POA: Diagnosis present

## 2024-03-19 DIAGNOSIS — Z8249 Family history of ischemic heart disease and other diseases of the circulatory system: Secondary | ICD-10-CM

## 2024-03-19 DIAGNOSIS — I2510 Atherosclerotic heart disease of native coronary artery without angina pectoris: Secondary | ICD-10-CM | POA: Diagnosis present

## 2024-03-19 DIAGNOSIS — Z79890 Hormone replacement therapy (postmenopausal): Secondary | ICD-10-CM

## 2024-03-19 DIAGNOSIS — D638 Anemia in other chronic diseases classified elsewhere: Secondary | ICD-10-CM | POA: Diagnosis present

## 2024-03-19 DIAGNOSIS — E669 Obesity, unspecified: Secondary | ICD-10-CM | POA: Diagnosis present

## 2024-03-19 DIAGNOSIS — J439 Emphysema, unspecified: Secondary | ICD-10-CM | POA: Diagnosis present

## 2024-03-19 DIAGNOSIS — E162 Hypoglycemia, unspecified: Secondary | ICD-10-CM

## 2024-03-19 DIAGNOSIS — Z9841 Cataract extraction status, right eye: Secondary | ICD-10-CM

## 2024-03-19 DIAGNOSIS — Z87891 Personal history of nicotine dependence: Secondary | ICD-10-CM

## 2024-03-19 DIAGNOSIS — E1122 Type 2 diabetes mellitus with diabetic chronic kidney disease: Secondary | ICD-10-CM | POA: Diagnosis present

## 2024-03-19 DIAGNOSIS — Z992 Dependence on renal dialysis: Secondary | ICD-10-CM

## 2024-03-19 DIAGNOSIS — Z96652 Presence of left artificial knee joint: Secondary | ICD-10-CM | POA: Diagnosis present

## 2024-03-19 DIAGNOSIS — Z79899 Other long term (current) drug therapy: Secondary | ICD-10-CM

## 2024-03-19 DIAGNOSIS — Z833 Family history of diabetes mellitus: Secondary | ICD-10-CM

## 2024-03-19 DIAGNOSIS — E119 Type 2 diabetes mellitus without complications: Secondary | ICD-10-CM

## 2024-03-19 DIAGNOSIS — Z91048 Other nonmedicinal substance allergy status: Secondary | ICD-10-CM

## 2024-03-19 DIAGNOSIS — E11649 Type 2 diabetes mellitus with hypoglycemia without coma: Secondary | ICD-10-CM | POA: Diagnosis present

## 2024-03-19 DIAGNOSIS — I480 Paroxysmal atrial fibrillation: Secondary | ICD-10-CM | POA: Diagnosis present

## 2024-03-19 DIAGNOSIS — Z794 Long term (current) use of insulin: Secondary | ICD-10-CM

## 2024-03-19 DIAGNOSIS — E1142 Type 2 diabetes mellitus with diabetic polyneuropathy: Secondary | ICD-10-CM | POA: Diagnosis present

## 2024-03-19 DIAGNOSIS — N186 End stage renal disease: Secondary | ICD-10-CM | POA: Diagnosis present

## 2024-03-19 DIAGNOSIS — J449 Chronic obstructive pulmonary disease, unspecified: Secondary | ICD-10-CM | POA: Diagnosis present

## 2024-03-19 DIAGNOSIS — I959 Hypotension, unspecified: Secondary | ICD-10-CM | POA: Diagnosis present

## 2024-03-19 DIAGNOSIS — G4733 Obstructive sleep apnea (adult) (pediatric): Secondary | ICD-10-CM | POA: Diagnosis present

## 2024-03-19 DIAGNOSIS — G928 Other toxic encephalopathy: Principal | ICD-10-CM | POA: Diagnosis present

## 2024-03-19 DIAGNOSIS — Z66 Do not resuscitate: Secondary | ICD-10-CM | POA: Diagnosis present

## 2024-03-19 DIAGNOSIS — Z888 Allergy status to other drugs, medicaments and biological substances status: Secondary | ICD-10-CM

## 2024-03-19 LAB — HEPATIC FUNCTION PANEL
ALT: 9 U/L (ref 0–44)
AST: 13 U/L — ABNORMAL LOW (ref 15–41)
Albumin: 2.5 g/dL — ABNORMAL LOW (ref 3.5–5.0)
Alkaline Phosphatase: 62 U/L (ref 38–126)
Bilirubin, Direct: 0.1 mg/dL (ref 0.0–0.2)
Indirect Bilirubin: 0.6 mg/dL (ref 0.3–0.9)
Total Bilirubin: 0.7 mg/dL (ref 0.0–1.2)
Total Protein: 5.7 g/dL — ABNORMAL LOW (ref 6.5–8.1)

## 2024-03-19 LAB — BASIC METABOLIC PANEL WITH GFR
Anion gap: 15 (ref 5–15)
BUN: 69 mg/dL — ABNORMAL HIGH (ref 8–23)
CO2: 27 mmol/L (ref 22–32)
Calcium: 8.4 mg/dL — ABNORMAL LOW (ref 8.9–10.3)
Chloride: 98 mmol/L (ref 98–111)
Creatinine, Ser: 3.62 mg/dL — ABNORMAL HIGH (ref 0.44–1.00)
GFR, Estimated: 12 mL/min — ABNORMAL LOW (ref >60)
Glucose, Bld: 96 mg/dL (ref 70–99)
Potassium: 3.9 mmol/L (ref 3.5–5.1)
Sodium: 140 mmol/L (ref 135–145)

## 2024-03-19 LAB — URINALYSIS, ROUTINE W REFLEX MICROSCOPIC
Bilirubin Urine: NEGATIVE
Glucose, UA: NEGATIVE mg/dL
Hgb urine dipstick: NEGATIVE
Ketones, ur: NEGATIVE mg/dL
Leukocytes,Ua: NEGATIVE
Nitrite: NEGATIVE
Protein, ur: >=300 mg/dL — AB
Specific Gravity, Urine: 1.011 (ref 1.005–1.030)
pH: 5 (ref 5.0–8.0)

## 2024-03-19 LAB — I-STAT VENOUS BLOOD GAS, ED
Acid-Base Excess: 2 mmol/L (ref 0.0–2.0)
Bicarbonate: 26.5 mmol/L (ref 20.0–28.0)
Calcium, Ion: 0.99 mmol/L — ABNORMAL LOW (ref 1.15–1.40)
HCT: 38 % (ref 36.0–46.0)
Hemoglobin: 12.9 g/dL (ref 12.0–15.0)
O2 Saturation: 99 %
Potassium: 3.8 mmol/L (ref 3.5–5.1)
Sodium: 138 mmol/L (ref 135–145)
TCO2: 28 mmol/L (ref 22–32)
pCO2, Ven: 38.8 mmHg — ABNORMAL LOW (ref 44–60)
pH, Ven: 7.442 — ABNORMAL HIGH (ref 7.25–7.43)
pO2, Ven: 120 mmHg — ABNORMAL HIGH (ref 32–45)

## 2024-03-19 LAB — CBG MONITORING, ED
Glucose-Capillary: 99 mg/dL (ref 70–99)
Glucose-Capillary: 67 mg/dL — ABNORMAL LOW (ref 70–99)
Glucose-Capillary: 57 mg/dL — ABNORMAL LOW (ref 70–99)
Glucose-Capillary: 69 mg/dL — ABNORMAL LOW (ref 70–99)
Glucose-Capillary: 85 mg/dL (ref 70–99)
Glucose-Capillary: 97 mg/dL (ref 70–99)
Glucose-Capillary: 101 mg/dL — ABNORMAL HIGH (ref 70–99)
Glucose-Capillary: 114 mg/dL — ABNORMAL HIGH (ref 70–99)

## 2024-03-19 LAB — CBC
HCT: 39.8 % (ref 36.0–46.0)
Hemoglobin: 11.9 g/dL — ABNORMAL LOW (ref 12.0–15.0)
MCH: 29.1 pg (ref 26.0–34.0)
MCHC: 29.9 g/dL — ABNORMAL LOW (ref 30.0–36.0)
MCV: 97.3 fL (ref 80.0–100.0)
Platelets: 422 K/uL — ABNORMAL HIGH (ref 150–400)
RBC: 4.09 MIL/uL (ref 3.87–5.11)
RDW: 16.2 % — ABNORMAL HIGH (ref 11.5–15.5)
WBC: 17.1 K/uL — ABNORMAL HIGH (ref 4.0–10.5)
nRBC: 0 % (ref 0.0–0.2)

## 2024-03-19 LAB — PROTIME-INR
INR: 1.1 (ref 0.8–1.2)
Prothrombin Time: 14.4 s (ref 11.4–15.2)

## 2024-03-19 LAB — AMMONIA: Ammonia: 15 umol/L (ref 9–35)

## 2024-03-19 MED ORDER — LIDOCAINE 5 % EX PTCH
2.0000 | MEDICATED_PATCH | CUTANEOUS | Status: DC
  Administered 2024-03-19 – 2024-03-23 (×5): 2 via TRANSDERMAL
  Filled 2024-03-19 (×5): qty 2

## 2024-03-19 MED ORDER — VANCOMYCIN HCL 1500 MG/300ML IV SOLN
1500.0000 mg | Freq: Once | INTRAVENOUS | Status: AC
  Administered 2024-03-19: 1500 mg via INTRAVENOUS
  Filled 2024-03-19: qty 300

## 2024-03-19 MED ORDER — APIXABAN 2.5 MG PO TABS
2.5000 mg | ORAL_TABLET | Freq: Two times a day (BID) | ORAL | Status: DC
  Administered 2024-03-19 – 2024-03-23 (×9): 2.5 mg via ORAL
  Filled 2024-03-19 (×9): qty 1

## 2024-03-19 MED ORDER — ISOSORBIDE MONONITRATE ER 30 MG PO TB24: 60.0000 mg | ORAL_TABLET | Freq: Every day | ORAL | Status: DC

## 2024-03-19 MED ORDER — SODIUM CHLORIDE 0.9 % IV SOLN
2.0000 g | Freq: Once | INTRAVENOUS | Status: AC
  Administered 2024-03-19: 2 g via INTRAVENOUS
  Filled 2024-03-19: qty 12.5

## 2024-03-19 MED ORDER — DOCUSATE SODIUM 100 MG PO CAPS: 100.0000 mg | ORAL_CAPSULE | Freq: Two times a day (BID) | ORAL | Status: DC | PRN

## 2024-03-19 MED ORDER — NALOXONE HCL 2 MG/2ML IJ SOSY
1.0000 mg | PREFILLED_SYRINGE | Freq: Once | INTRAMUSCULAR | Status: AC
  Administered 2024-03-19: 1 mg via INTRAVENOUS
  Filled 2024-03-19: qty 2

## 2024-03-19 MED ORDER — SODIUM CHLORIDE 0.9% FLUSH
3.0000 mL | Freq: Two times a day (BID) | INTRAVENOUS | Status: DC
  Administered 2024-03-19 – 2024-03-23 (×8): 3 mL via INTRAVENOUS

## 2024-03-19 MED ORDER — LEVOTHYROXINE SODIUM 100 MCG PO TABS
100.0000 ug | ORAL_TABLET | ORAL | Status: DC
  Administered 2024-03-20 – 2024-03-24 (×2): 100 ug via ORAL
  Filled 2024-03-19 (×2): qty 1

## 2024-03-19 MED ORDER — MIDODRINE HCL 5 MG PO TABS: 10.0000 mg | ORAL_TABLET | ORAL | Status: DC | PRN

## 2024-03-19 MED ORDER — CHLORHEXIDINE GLUCONATE CLOTH 2 % EX PADS
6.0000 | MEDICATED_PAD | Freq: Every day | CUTANEOUS | Status: DC
  Administered 2024-03-20 – 2024-03-23 (×4): 6 via TOPICAL

## 2024-03-19 MED ORDER — CHLORHEXIDINE GLUCONATE CLOTH 2 % EX PADS
6.0000 | MEDICATED_PAD | Freq: Every day | CUTANEOUS | Status: DC
  Administered 2024-03-21 – 2024-03-23 (×3): 6 via TOPICAL

## 2024-03-19 MED ORDER — ALBUTEROL SULFATE (2.5 MG/3ML) 0.083% IN NEBU
2.5000 mg | INHALATION_SOLUTION | RESPIRATORY_TRACT | Status: DC | PRN
  Administered 2024-03-20 – 2024-03-24 (×4): 2.5 mg via RESPIRATORY_TRACT
  Filled 2024-03-19 (×4): qty 3

## 2024-03-19 MED ORDER — BENZONATATE 100 MG PO CAPS
200.0000 mg | ORAL_CAPSULE | Freq: Two times a day (BID) | ORAL | Status: DC | PRN
  Administered 2024-03-20 – 2024-03-21 (×2): 200 mg via ORAL
  Filled 2024-03-19 (×2): qty 2

## 2024-03-19 MED ORDER — ACETAMINOPHEN 650 MG RE SUPP: 650.0000 mg | Freq: Four times a day (QID) | RECTAL | Status: DC | PRN

## 2024-03-19 MED ORDER — LEVOTHYROXINE SODIUM 100 MCG PO TABS: 100.0000 ug | ORAL_TABLET | ORAL | Status: DC

## 2024-03-19 MED ORDER — FENTANYL CITRATE (PF) 50 MCG/ML IJ SOSY
25.0000 ug | PREFILLED_SYRINGE | INTRAMUSCULAR | Status: DC | PRN
  Administered 2024-03-19 – 2024-03-20 (×5): 25 ug via INTRAVENOUS
  Filled 2024-03-19 (×5): qty 1

## 2024-03-19 MED ORDER — AMLODIPINE BESYLATE 5 MG PO TABS: 7.5000 mg | ORAL_TABLET | Freq: Every morning | ORAL | Status: DC

## 2024-03-19 MED ORDER — MIDODRINE HCL 5 MG PO TABS
10.0000 mg | ORAL_TABLET | ORAL | Status: AC | PRN
  Administered 2024-03-24: 10 mg via ORAL

## 2024-03-19 MED ORDER — SODIUM CHLORIDE 0.9 % IV SOLN: 250.0000 mL | INTRAVENOUS | Status: AC | PRN

## 2024-03-19 MED ORDER — AMLODIPINE BESYLATE 5 MG PO TABS: 2.5000 mg | ORAL_TABLET | Freq: Every morning | ORAL | Status: DC

## 2024-03-19 MED ORDER — HYDROCODONE-ACETAMINOPHEN 5-325 MG PO TABS
1.0000 | ORAL_TABLET | Freq: Four times a day (QID) | ORAL | Status: DC | PRN
  Administered 2024-03-19 – 2024-03-22 (×9): 1 via ORAL
  Filled 2024-03-19 (×10): qty 1

## 2024-03-19 MED ORDER — SODIUM CHLORIDE 0.9% FLUSH
3.0000 mL | Freq: Two times a day (BID) | INTRAVENOUS | Status: DC
  Administered 2024-03-19 – 2024-03-23 (×9): 3 mL via INTRAVENOUS

## 2024-03-19 MED ORDER — SODIUM CHLORIDE 0.9 % IV SOLN: INTRAVENOUS | Status: AC

## 2024-03-19 MED ORDER — DEXTROSE 10 % IV SOLN
100.0000 mL | INTRAVENOUS | Status: DC
  Administered 2024-03-19: 100 mL via INTRAVENOUS

## 2024-03-19 MED ORDER — ATORVASTATIN CALCIUM 40 MG PO TABS
40.0000 mg | ORAL_TABLET | Freq: Every day | ORAL | Status: DC
  Administered 2024-03-20 – 2024-03-23 (×4): 40 mg via ORAL
  Filled 2024-03-19 (×4): qty 1

## 2024-03-19 MED ORDER — SODIUM CHLORIDE 0.9% FLUSH: 3.0000 mL | INTRAVENOUS | Status: DC | PRN

## 2024-03-19 MED ORDER — TORSEMIDE 20 MG PO TABS
100.0000 mg | ORAL_TABLET | Freq: Two times a day (BID) | ORAL | Status: DC
  Administered 2024-03-19: 100 mg via ORAL
  Filled 2024-03-19: qty 5

## 2024-03-19 MED ORDER — ACETAMINOPHEN 325 MG PO TABS: 650.0000 mg | ORAL_TABLET | Freq: Four times a day (QID) | ORAL | Status: DC | PRN

## 2024-03-19 MED ORDER — HEPARIN SODIUM (PORCINE) 5000 UNIT/ML IJ SOLN
5000.0000 [IU] | Freq: Three times a day (TID) | INTRAMUSCULAR | Status: DC
  Administered 2024-03-19: 5000 [IU] via SUBCUTANEOUS
  Filled 2024-03-19: qty 1

## 2024-03-19 MED ORDER — POLYETHYLENE GLYCOL 3350 17 G PO PACK: 17.0000 g | PACK | Freq: Every day | ORAL | Status: DC | PRN

## 2024-03-19 MED ORDER — INSULIN ASPART 100 UNIT/ML IJ SOLN
0.0000 [IU] | Freq: Three times a day (TID) | INTRAMUSCULAR | Status: DC
  Administered 2024-03-20: 3 [IU] via SUBCUTANEOUS
  Administered 2024-03-20: 2 [IU] via SUBCUTANEOUS
  Administered 2024-03-21: 5 [IU] via SUBCUTANEOUS
  Administered 2024-03-21: 2 [IU] via SUBCUTANEOUS
  Administered 2024-03-21: 3 [IU] via SUBCUTANEOUS
  Administered 2024-03-22 (×2): 2 [IU] via SUBCUTANEOUS
  Administered 2024-03-22: 8 [IU] via SUBCUTANEOUS
  Administered 2024-03-23: 5 [IU] via SUBCUTANEOUS
  Administered 2024-03-23: 3 [IU] via SUBCUTANEOUS
  Administered 2024-03-23: 5 [IU] via SUBCUTANEOUS
  Administered 2024-03-24: 3 [IU] via SUBCUTANEOUS
  Filled 2024-03-19: qty 5
  Filled 2024-03-19: qty 2
  Filled 2024-03-19: qty 5
  Filled 2024-03-19: qty 3
  Filled 2024-03-19: qty 8
  Filled 2024-03-19 (×2): qty 2
  Filled 2024-03-19: qty 3
  Filled 2024-03-19: qty 5
  Filled 2024-03-19: qty 2
  Filled 2024-03-19 (×2): qty 3

## 2024-03-19 MED ORDER — DEXTROSE 10 % IV SOLN: INTRAVENOUS | Status: DC

## 2024-03-19 MED ORDER — LEVOTHYROXINE SODIUM 100 MCG PO TABS
200.0000 ug | ORAL_TABLET | ORAL | Status: DC
  Administered 2024-03-21 – 2024-03-23 (×3): 200 ug via ORAL
  Filled 2024-03-19 (×4): qty 2

## 2024-03-19 NOTE — H&P (Deleted)
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 skin See admin instructions. Inject 0-20 units into the skin three times a day before meals as needed, per [facility] SCALE    [provider]  isosorbide  mononitrate (IMDUR ) 60 MG 24 hr tablet Take 1 tablet (60 mg total) by mouth daily. 05/19/23   Jeffrie Oneil BROCKS, MD  isosorbide  mononitrate (IMDUR ) 60 MG 24 hr tablet Take 60 mg by mouth daily.    [provider]  LANTUS  100 UNIT/ML injection Inject 45 Units into the skin every morning. 03/03/23   [provider]  LANTUS  SOLOSTAR 100 UNIT/ML Solostar Pen Inject 45 Units into the skin daily before breakfast.    [provider]  levothyroxine  (SYNTHROID ) 200 MCG tablet Take 100-200 mcg by mouth See admin instructions. Take 1/2 tablet by mouth every Monday and Friday, then take 1 tablet all  other days    [provider]  levothyroxine  (SYNTHROID ) 200 MCG tablet Take 200 mcg by mouth daily before breakfast.    [provider]  lidocaine  (LIDODERM ) 5 % Place 3 patches onto the skin daily. Remove & Discard patch within 12 hours or as directed by MD 03/14/24   Caleen Burgess BROCKS, MD  nitroGLYCERIN  (NITROSTAT ) 0.4 MG SL tablet Place 1 tablet (0.4 mg total) under the tongue every 5 (five) minutes as needed for chest pain. 05/19/23   Jeffrie Oneil BROCKS, MD  NOVOLOG  FLEXPEN 100 UNIT/ML  FlexPen Inject 0-20 Units into the skin 3 (three) times daily as needed for high blood sugar (PER SLIDING SCALE- before meals).    [provider]  omeprazole  (PRILOSEC OTC) 20 MG tablet Take 20 mg by mouth daily.    [provider]  omeprazole  (PRILOSEC OTC) 20 MG tablet Take 20 mg by mouth daily before lunch.    [provider]  polyethylene glycol powder (GLYCOLAX /MIRALAX ) 17 GM/SCOOP powder Take 17 g by mouth 2 (two) times daily as needed for moderate constipation or severe constipation. Dissolve 1 capful (17g) in 4-8 ounces of liquid and take by mouth daily. 03/14/24   Caleen Burgess BROCKS, MD  predniSONE  (DELTASONE ) 20 MG tablet Take 1 tablet (20 mg total) by mouth daily with breakfast. 03/10/24   Neda Hammond A, MD  PRESCRIPTION MEDICATION CPAP- At bedtime    [provider]  sevelamer  carbonate (RENVELA ) 800 MG tablet Take 1 tablet (800 mg total) by mouth 3 (three) times daily with meals. 11/14/22   Lue Elsie BROCKS, MD  sevelamer  carbonate (RENVELA ) 800 MG tablet Take 800 mg by mouth 3 (three) times daily with meals.    [provider]  SYSTANE ULTRA 0.4-0.3 % SOLN Place 1 drop into both eyes 4 (four) times daily as needed (for dryness).    [provider]  torsemide  (DEMADEX ) 100 MG tablet Take 1 tablet (100 mg total) by mouth 2 (two) times daily. Patient taking differently: Take 100 mg by mouth See admin instructions. Take 1 tablet by mouth  twice daily, except Monday and Friday 11/14/22   Lue Elsie BROCKS, MD  torsemide  (DEMADEX ) 100 MG tablet Take 100 mg by mouth in the morning and at bedtime.    [provider]  traMADol  (ULTRAM ) 50 MG tablet Take 1 tablet (50 mg total) by mouth every 8 (eight) hours. 03/14/24   Caleen Burgess BROCKS, MD  TRUE METRIX BLOOD GLUCOSE TEST test strip  09/18/17   [provider]  TRUEplus Lancets 28G MISC  11/24/19   [provider]  vitamin B-12 (CYANOCOBALAMIN ) 1000 MCG tablet Take 1,000 mcg by mouth daily with lunch.    [provider]  Vitamin D3 (VITAMIN D ) 25 MCG tablet Take 1,000 Units by mouth daily with lunch.    [provider]     Critical care time: 45 Minutes.     Tamela Stakes, MD  Attending Physician, Critical Care Medicine [facility] See Amion for pager If no response to pager, please call [phone] until 7pm After 7pm, Please call E-link [phone]

## 2024-03-19 NOTE — ED Notes (Signed)
 CCMD called.

## 2024-03-19 NOTE — ED Provider Notes (Signed)
.  Ultrasound ED Peripheral IV (Provider)  Date/Time: 03/19/2024 3:26 PM  Performed by: Cottie Donnice PARAS, MD Authorized by: Cottie Donnice PARAS, MD   Procedure details:    Indications: hydration, hypotension, multiple failed IV attempts and poor IV access     Skin Prep: chlorhexidine  gluconate     Location:  Right AC   Angiocath:  20 G   Bedside Ultrasound Guided: Yes     Images: not archived     Patient tolerated procedure without complications: Yes     Dressing applied: Yes       Cottie Donnice PARAS, MD 03/19/24 1526

## 2024-03-19 NOTE — ED Notes (Signed)
 Only able to get one set of cultures. Paramedic dylan aware.

## 2024-03-19 NOTE — Progress Notes (Signed)
 ED Pharmacy Antibiotic Sign Off An antibiotic consult was received from an ED provider for vancomycin  and cefepime  per pharmacy dosing for sepsis. A chart review was completed to assess appropriateness.   The following one time order(s) were placed:  Vancomycin  1500 mg IV x 1 Cefepime  2g IV x 1  Further antibiotic and/or antibiotic pharmacy consults should be ordered by the admitting provider if indicated.   Thank you for allowing pharmacy to be a part of this patient's care.   Dorn Poot, [facility]  Clinical Pharmacist 03/19/24 9:38 AM

## 2024-03-19 NOTE — Consult Note (Signed)
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 Take 2.5 mg by mouth in the morning and at bedtime.       [provider]  escitalopram  (LEXAPRO ) 10 MG tablet Take 10 mg by mouth daily. Patient not taking: Reported on 03/10/2024       [provider]  escitalopram  (LEXAPRO ) 10 MG tablet Take 10 mg by mouth at bedtime.       [provider]  escitalopram  (LEXAPRO ) 5 MG tablet Take 1 tablet (5 mg total) by mouth daily. 11/14/22     Lue Elsie BROCKS, MD  ferrous sulfate  325 (65 FE) MG tablet Take 1 tablet (325 mg total) by mouth daily with breakfast. 01/26/20     Fernande Elspeth BROCKS, MD  fluticasone  (FLONASE ) 50 MCG/ACT nasal spray Place 1 spray into both nostrils 2 (two) times daily as needed for allergies or rhinitis.       [provider]  gabapentin  (NEURONTIN ) 800 MG tablet Take 800 mg by mouth in the morning, at noon, in the evening, and at  bedtime.       [provider]  gabapentin  (NEURONTIN ) 800 MG tablet Take 800 mg by mouth See admin instructions. Take 800 mg by mouth three to four times a day as needed for pain       [provider]  HYDROcodone -acetaminophen  (NORCO) 7.5-325 MG tablet Take 1 tablet by mouth See admin instructions. Take 1 tablet by mouth 2 times a day and an additional 1 tablet up to twice a day as needed for unresolved pain 03/14/24     Amin, Ankit C, MD  insulin  aspart (NOVOLOG ) 100 UNIT/ML FlexPen Inject 0-20 Units into the skin See admin instructions. Inject 0-20 units into the skin three times a day before meals as needed, per [facility] SCALE       [provider]  isosorbide  mononitrate (IMDUR ) 60 MG 24 hr tablet Take 1 tablet (60 mg total) by mouth daily. 05/19/23     Jeffrie Oneil BROCKS, MD  isosorbide  mononitrate (IMDUR ) 60 MG 24 hr tablet Take 60 mg by mouth daily.       [provider]  LANTUS  100 UNIT/ML injection Inject 45 Units into the skin every morning. 03/03/23     [provider]  LANTUS  SOLOSTAR 100 UNIT/ML Solostar Pen Inject 45 Units into the skin daily before breakfast.       [provider]  levothyroxine  (SYNTHROID ) 200 MCG tablet Take 100-200 mcg by mouth See admin instructions. Take 1/2 tablet by mouth every Monday and Friday, then take 1 tablet all other days       [provider]  levothyroxine  (SYNTHROID ) 200 MCG tablet Take 200 mcg by mouth daily before breakfast.       [provider]  lidocaine  (LIDODERM ) 5 % Place 3 patches onto the skin daily. Remove & Discard patch within 12 hours or as directed by MD 03/14/24     Caleen Burgess BROCKS, MD  nitroGLYCERIN  (NITROSTAT ) 0.4 MG SL tablet Place 1 tablet (0.4 mg total) under the tongue every 5 (five) minutes as needed for chest pain. 05/19/23     Jeffrie Oneil  C, MD  NOVOLOG  FLEXPEN 100 UNIT/ML FlexPen Inject 0-20 Units into the skin 3 (three) times daily as needed for high blood sugar  (PER SLIDING SCALE- before meals).       [provider]  omeprazole  (PRILOSEC OTC) 20 MG tablet Take 20 mg by mouth daily.       [provider]  omeprazole  (PRILOSEC OTC) 20 MG tablet Take 20 mg by mouth daily before lunch.       [provider]  polyethylene glycol powder (GLYCOLAX /MIRALAX ) 17 GM/SCOOP powder Take 17 g by mouth 2 (two) times daily as needed for moderate constipation or severe constipation. Dissolve 1 capful (17g) in 4-8 ounces of liquid and take by mouth daily. 03/14/24     Amin, Ankit C, MD  predniSONE  (DELTASONE ) 20 MG tablet Take 1 tablet (20 mg total) by mouth daily with breakfast. 03/10/24     Neda Hammond A, MD  PRESCRIPTION MEDICATION CPAP- At bedtime       [provider]  sevelamer  carbonate (RENVELA ) 800 MG tablet Take 1 tablet (800 mg total) by mouth 3 (three) times daily with meals. 11/14/22     Lue Elsie BROCKS, MD  sevelamer  carbonate (RENVELA ) 800 MG tablet Take 800 mg by mouth 3 (three) times daily with meals.       [provider]  SYSTANE ULTRA 0.4-0.3 % SOLN Place 1 drop into both eyes 4 (four) times daily as needed (for dryness).       [provider]  torsemide  (DEMADEX ) 100 MG tablet Take 1 tablet (100 mg total) by mouth 2 (two) times daily. Patient taking differently: Take 100 mg by mouth See admin instructions. Take 1 tablet by mouth twice daily, except Monday and Friday 11/14/22     Lue Elsie BROCKS, MD  torsemide  (DEMADEX ) 100 MG tablet Take 100 mg by mouth in the morning and at bedtime.       [provider]  traMADol  (ULTRAM ) 50 MG tablet Take 1 tablet (50 mg total) by mouth every 8 (eight) hours. 03/14/24     Caleen Burgess BROCKS, MD  TRUE METRIX BLOOD GLUCOSE TEST test strip   09/18/17     [provider]  TRUEplus Lancets 28G MISC   11/24/19     [provider]  vitamin B-12 (CYANOCOBALAMIN ) 1000 MCG tablet Take 1,000 mcg by mouth daily with lunch.       [provider]  Vitamin D3 (VITAMIN D ) 25 MCG tablet Take 1,000 Units by mouth daily with lunch.       [provider]      Critical care time: 45 Minutes.        Tamela Stakes, MD  Attending Physician, Critical Care Medicine [place] [facility] See Amion for pager If no response to pager, please call [phone] until 7pm After 7pm, Please call E-link [phone]                 Revision History  Routing History
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
## 2024-03-19 NOTE — ED Triage Notes (Signed)
 Pt bibgcems from home husband found her unresponsive. Cbg 54 with ems and oxygen  54% pt on 3-4L at baseline. Pinpoint pupils . 1mg  narcan given with ems. Pt altered with ems gcs 13. EMS gave 30 grams of d10. 117/80 Hr 60-90 100% 3 L
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
## 2024-03-19 NOTE — Progress Notes (Signed)
 A STAT consult was placed to IV Therapy for additional access; pt limited to R arm only ; currently has a 22ga in the R inner wrist area; Right arm assessed thoroughly by 2 IV nurses w ultrasound; attempted the cephalic vein however vein infiltrated w NS flush;  pt also has a HDC;  suggest central access for her for meds/fluids.
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
## 2024-03-20 ENCOUNTER — Other Ambulatory Visit: Payer: Self-pay

## 2024-03-20 DIAGNOSIS — G9341 Metabolic encephalopathy: Secondary | ICD-10-CM | POA: Diagnosis not present

## 2024-03-20 LAB — RENAL FUNCTION PANEL
Albumin: 2.6 g/dL — ABNORMAL LOW (ref 3.5–5.0)
Anion gap: 13 (ref 5–15)
BUN: 69 mg/dL — ABNORMAL HIGH (ref 8–23)
CO2: 27 mmol/L (ref 22–32)
Calcium: 8.6 mg/dL — ABNORMAL LOW (ref 8.9–10.3)
Chloride: 97 mmol/L — ABNORMAL LOW (ref 98–111)
Creatinine, Ser: 3.57 mg/dL — ABNORMAL HIGH (ref 0.44–1.00)
GFR, Estimated: 12 mL/min — ABNORMAL LOW (ref >60)
Glucose, Bld: 120 mg/dL — ABNORMAL HIGH (ref 70–99)
Phosphorus: 6.2 mg/dL — ABNORMAL HIGH (ref 2.5–4.6)
Potassium: 4.3 mmol/L (ref 3.5–5.1)
Sodium: 137 mmol/L (ref 135–145)

## 2024-03-20 LAB — CBG MONITORING, ED
Glucose-Capillary: 133 mg/dL — ABNORMAL HIGH (ref 70–99)
Glucose-Capillary: 139 mg/dL — ABNORMAL HIGH (ref 70–99)
Glucose-Capillary: 157 mg/dL — ABNORMAL HIGH (ref 70–99)

## 2024-03-20 LAB — GLUCOSE, CAPILLARY: Glucose-Capillary: 101 mg/dL — ABNORMAL HIGH (ref 70–99)

## 2024-03-20 LAB — CBC
HCT: 41.6 % (ref 36.0–46.0)
Hemoglobin: 12.3 g/dL (ref 12.0–15.0)
MCH: 28.7 pg (ref 26.0–34.0)
MCHC: 29.6 g/dL — ABNORMAL LOW (ref 30.0–36.0)
MCV: 97.2 fL (ref 80.0–100.0)
Platelets: 437 K/uL — ABNORMAL HIGH (ref 150–400)
RBC: 4.28 MIL/uL (ref 3.87–5.11)
RDW: 16.4 % — ABNORMAL HIGH (ref 11.5–15.5)
WBC: 14.8 K/uL — ABNORMAL HIGH (ref 4.0–10.5)
nRBC: 0 % (ref 0.0–0.2)

## 2024-03-20 LAB — PROCALCITONIN: Procalcitonin: <0.1 ng/mL

## 2024-03-20 MED ORDER — GUAIFENESIN 100 MG/5ML PO LIQD
5.0000 mL | ORAL | Status: DC | PRN
  Administered 2024-03-21: 5 mL via ORAL
  Filled 2024-03-20: qty 5

## 2024-03-20 MED ORDER — ALBUMIN HUMAN 25 % IV SOLN
25.0000 g | Freq: Once | INTRAVENOUS | Status: AC
  Administered 2024-03-20: 12.5 g via INTRAVENOUS

## 2024-03-20 MED ORDER — FENTANYL CITRATE (PF) 50 MCG/ML IJ SOSY
12.5000 ug | PREFILLED_SYRINGE | INTRAMUSCULAR | Status: DC | PRN
Start: 2024-03-20 — End: 2024-03-24
  Administered 2024-03-20 – 2024-03-22 (×6): 12.5 ug via INTRAVENOUS
  Filled 2024-03-20 (×5): qty 1

## 2024-03-20 MED ORDER — ALBUMIN HUMAN 25 % IV SOLN
12.5000 g | Freq: Once | INTRAVENOUS | Status: DC
  Filled 2024-03-20: qty 50

## 2024-03-20 MED ORDER — ALBUMIN HUMAN 25 % IV SOLN
INTRAVENOUS | Status: AC
  Filled 2024-03-20: qty 50

## 2024-03-20 MED ORDER — HYDROCODONE-ACETAMINOPHEN 5-325 MG PO TABS
ORAL_TABLET | ORAL | Status: AC
  Filled 2024-03-20: qty 1

## 2024-03-20 MED ORDER — FENTANYL CITRATE (PF) 50 MCG/ML IJ SOSY
PREFILLED_SYRINGE | INTRAMUSCULAR | Status: AC
  Filled 2024-03-20: qty 1

## 2024-03-20 MED ORDER — HEPARIN SODIUM (PORCINE) 1000 UNIT/ML IJ SOLN
4000.0000 [IU] | Freq: Once | INTRAMUSCULAR | Status: AC
  Administered 2024-03-20: 4000 [IU]

## 2024-03-20 MED ORDER — HEPARIN SODIUM (PORCINE) 1000 UNIT/ML IJ SOLN
INTRAMUSCULAR | Status: AC
  Filled 2024-03-20: qty 4

## 2024-03-20 MED ORDER — MIDODRINE HCL 5 MG PO TABS
5.0000 mg | ORAL_TABLET | Freq: Three times a day (TID) | ORAL | Status: DC
  Administered 2024-03-20 (×3): 5 mg via ORAL
  Filled 2024-03-20 (×3): qty 1
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
## 2024-03-20 NOTE — Progress Notes (Signed)
-----------------------------------------------------------  [facility]--------------------------------------------------- --------------------------------------------------------D(Data) A(Action) R(response) Note------------------------------------------------  Patient Name: Kayla [name] Patient DOB: [birth year] Date: @TODAY @      Data: Reviewed labs, VS, notes.      Action: No action needed at this time.      Response:       Sharolyn Batman, RN [facility]

## 2024-03-20 NOTE — ED Notes (Signed)
 Pt found to be getting out of bed when rounding by nursing staff, pt disoriented and ask where she is and why is she here, pt returned to bed and attempted to reorient. Pt previously alert and oriented throughout the night. CBG 133 and VS rechecked. Opyd MD paged and at bedside at this time.

## 2024-03-20 NOTE — Progress Notes (Signed)
 Pt receives out-pt HD at [facility] [place] on Mon and Fri at 10:25 am chair time. Will assist as needed.   Randine Mungo Dialysis Navigator [phone]
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
## 2024-03-20 NOTE — Plan of Care (Signed)
   Problem: Education: Goal: Ability to describe self-care measures that may prevent or decrease complications (Diabetes Survival Skills Education) will improve Outcome: Progressing

## 2024-03-20 NOTE — ED Notes (Signed)
 Pt reports being uncomfortable, secretary calling to order inpatient bed for pt.
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
## 2024-03-21 ENCOUNTER — Inpatient Hospital Stay (HOSPITAL_COMMUNITY)

## 2024-03-21 DIAGNOSIS — G9341 Metabolic encephalopathy: Secondary | ICD-10-CM | POA: Diagnosis not present

## 2024-03-21 LAB — CBC
HCT: 45.7 % (ref 36.0–46.0)
Hemoglobin: 14 g/dL (ref 12.0–15.0)
MCH: 29 pg (ref 26.0–34.0)
MCHC: 30.6 g/dL (ref 30.0–36.0)
MCV: 94.8 fL (ref 80.0–100.0)
Platelets: 475 K/uL — ABNORMAL HIGH (ref 150–400)
RBC: 4.82 MIL/uL (ref 3.87–5.11)
RDW: 16.2 % — ABNORMAL HIGH (ref 11.5–15.5)
WBC: 17.7 K/uL — ABNORMAL HIGH (ref 4.0–10.5)
nRBC: 0 % (ref 0.0–0.2)

## 2024-03-21 LAB — RENAL FUNCTION PANEL
Albumin: 3.1 g/dL — ABNORMAL LOW (ref 3.5–5.0)
Anion gap: 13 (ref 5–15)
BUN: 32 mg/dL — ABNORMAL HIGH (ref 8–23)
CO2: 27 mmol/L (ref 22–32)
Calcium: 9 mg/dL (ref 8.9–10.3)
Chloride: 96 mmol/L — ABNORMAL LOW (ref 98–111)
Creatinine, Ser: 2.17 mg/dL — ABNORMAL HIGH (ref 0.44–1.00)
GFR, Estimated: 22 mL/min — ABNORMAL LOW (ref >60)
Glucose, Bld: 162 mg/dL — ABNORMAL HIGH (ref 70–99)
Phosphorus: 3.4 mg/dL (ref 2.5–4.6)
Potassium: 4.1 mmol/L (ref 3.5–5.1)
Sodium: 136 mmol/L (ref 135–145)

## 2024-03-21 LAB — GLUCOSE, CAPILLARY
Glucose-Capillary: 148 mg/dL — ABNORMAL HIGH (ref 70–99)
Glucose-Capillary: 221 mg/dL — ABNORMAL HIGH (ref 70–99)
Glucose-Capillary: 189 mg/dL — ABNORMAL HIGH (ref 70–99)
Glucose-Capillary: 270 mg/dL — ABNORMAL HIGH (ref 70–99)

## 2024-03-21 MED ORDER — ONDANSETRON HCL 4 MG/2ML IJ SOLN: 4.0000 mg | Freq: Four times a day (QID) | INTRAMUSCULAR | Status: DC | PRN

## 2024-03-21 MED ORDER — TORSEMIDE 20 MG PO TABS
100.0000 mg | ORAL_TABLET | ORAL | Status: DC
  Administered 2024-03-21 – 2024-03-23 (×3): 100 mg via ORAL
  Filled 2024-03-21 (×3): qty 5

## 2024-03-21 MED ORDER — INSULIN ASPART 100 UNIT/ML IJ SOLN
0.0000 [IU] | Freq: Every day | INTRAMUSCULAR | Status: DC
  Administered 2024-03-21: 3 [IU] via SUBCUTANEOUS
  Filled 2024-03-21: qty 3

## 2024-03-21 MED ORDER — INSULIN ASPART 100 UNIT/ML IJ SOLN: 0.0000 [IU] | Freq: Three times a day (TID) | INTRAMUSCULAR | Status: DC

## 2024-03-21 NOTE — Plan of Care (Signed)

## 2024-03-21 NOTE — Progress Notes (Signed)
   03/21/24 0108  BiPAP/CPAP/SIPAP  BiPAP/CPAP/SIPAP Pt Type Adult  Reason BIPAP/CPAP not in use Other(comment) (No availbile CPAP units in resp department. Will continue to look for availble unit)
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
## 2024-03-21 NOTE — Plan of Care (Signed)
 Problem: Education: Goal: Ability to describe self-care measures that may prevent or decrease complications (Diabetes Survival Skills Education) will improve 03/21/2024 0236 by Cesario Martina MANIFOLD, RN Outcome: Progressing 03/21/2024 0236 by Cesario Martina MANIFOLD, RN Outcome: Not Progressing Goal: Individualized Educational Video(s) 03/21/2024 0236 by Cesario Martina MANIFOLD, RN Outcome: Progressing 03/21/2024 0236 by Cesario Martina MANIFOLD, RN Outcome: Not Progressing   Problem: Coping: Goal: Ability to adjust to condition or change in health will improve 03/21/2024 0236 by Cesario Martina MANIFOLD, RN Outcome: Progressing 03/21/2024 0236 by Cesario Martina MANIFOLD, RN Outcome: Not Progressing   Problem: Fluid Volume: Goal: Ability to maintain a balanced intake and output will improve 03/21/2024 0236 by Cesario Martina MANIFOLD, RN Outcome: Progressing 03/21/2024 0236 by Cesario Martina MANIFOLD, RN Outcome: Not Progressing   Problem: Health Behavior/Discharge Planning: Goal: Ability to identify and utilize available resources and services will improve 03/21/2024 0236 by Cesario Martina MANIFOLD, RN Outcome: Progressing 03/21/2024 0236 by Cesario Martina MANIFOLD, RN Outcome: Not Progressing Goal: Ability to manage health-related needs will improve 03/21/2024 0236 by Cesario Martina MANIFOLD, RN Outcome: Progressing 03/21/2024 0236 by Cesario Martina MANIFOLD, RN Outcome: Not Progressing   Problem: Metabolic: Goal: Ability to maintain appropriate glucose levels will improve 03/21/2024 0236 by Cesario Martina MANIFOLD, RN Outcome: Progressing 03/21/2024 0236 by Cesario Martina MANIFOLD, RN Outcome: Not Progressing   Problem: Nutritional: Goal: Maintenance of adequate nutrition will improve 03/21/2024 0236 by Cesario Martina MANIFOLD, RN Outcome: Progressing 03/21/2024 0236 by Cesario Martina MANIFOLD, RN Outcome: Not Progressing Goal: Progress toward achieving an optimal weight will improve 03/21/2024 0236 by Cesario Martina MANIFOLD, RN Outcome: Progressing 03/21/2024 0236 by Cesario Martina MANIFOLD, RN Outcome: Not Progressing   Problem: Skin Integrity: Goal: Risk for  impaired skin integrity will decrease 03/21/2024 0236 by Cesario Martina MANIFOLD, RN Outcome: Progressing 03/21/2024 0236 by Cesario Martina MANIFOLD, RN Outcome: Not Progressing   Problem: Tissue Perfusion: Goal: Adequacy of tissue perfusion will improve 03/21/2024 0236 by Cesario Martina MANIFOLD, RN Outcome: Progressing 03/21/2024 0236 by Cesario Martina MANIFOLD, RN Outcome: Not Progressing   Problem: Education: Goal: Knowledge of General Education information will improve Description: Including pain rating scale, medication(s)/side effects and non-pharmacologic comfort measures 03/21/2024 0236 by Cesario Martina MANIFOLD, RN Outcome: Progressing 03/21/2024 0236 by Cesario Martina MANIFOLD, RN Outcome: Not Progressing   Problem: Health Behavior/Discharge Planning: Goal: Ability to manage health-related needs will improve 03/21/2024 0236 by Cesario Martina MANIFOLD, RN Outcome: Progressing 03/21/2024 0236 by Cesario Martina MANIFOLD, RN Outcome: Not Progressing   Problem: Clinical Measurements: Goal: Ability to maintain clinical measurements within normal limits will improve 03/21/2024 0236 by Cesario Martina MANIFOLD, RN Outcome: Progressing 03/21/2024 0236 by Cesario Martina MANIFOLD, RN Outcome: Not Progressing Goal: Will remain free from infection 03/21/2024 0236 by Cesario Martina MANIFOLD, RN Outcome: Progressing 03/21/2024 0236 by Cesario Martina MANIFOLD, RN Outcome: Not Progressing Goal: Diagnostic test results will improve 03/21/2024 0236 by Cesario Martina MANIFOLD, RN Outcome: Progressing 03/21/2024 0236 by Cesario Martina MANIFOLD, RN Outcome: Not Progressing Goal: Respiratory complications will improve 03/21/2024 0236 by Cesario Martina MANIFOLD, RN Outcome: Progressing 03/21/2024 0236 by Cesario Martina MANIFOLD, RN Outcome: Not Progressing Goal: Cardiovascular complication will be avoided 03/21/2024 0236 by Cesario Martina MANIFOLD, RN Outcome: Progressing 03/21/2024 0236 by Cesario Martina MANIFOLD, RN Outcome: Not Progressing   Problem: Activity: Goal: Risk for activity intolerance will decrease 03/21/2024 0236 by Cesario Martina MANIFOLD, RN Outcome: Progressing 03/21/2024 0236  by Cesario Martina MANIFOLD, RN Outcome: Not Progressing   Problem: Nutrition: Goal: Adequate nutrition will be maintained  03/21/2024 0236 by Cesario Martina MANIFOLD, RN Outcome: Progressing 03/21/2024 0236 by Cesario Martina MANIFOLD, RN Outcome: Not Progressing   Problem: Coping: Goal: Level of anxiety will decrease 03/21/2024 0236 by Cesario Martina MANIFOLD, RN Outcome: Progressing 03/21/2024 0236 by Cesario Martina MANIFOLD, RN Outcome: Not Progressing   Problem: Elimination: Goal: Will not experience complications related to bowel motility 03/21/2024 0236 by Cesario Martina MANIFOLD, RN Outcome: Progressing 03/21/2024 0236 by Cesario Martina MANIFOLD, RN Outcome: Not Progressing Goal: Will not experience complications related to urinary retention 03/21/2024 0236 by Cesario Martina MANIFOLD, RN Outcome: Progressing 03/21/2024 0236 by Cesario Martina MANIFOLD, RN Outcome: Not Progressing   Problem: Pain Managment: Goal: General experience of comfort will improve and/or be controlled 03/21/2024 0236 by Cesario Martina MANIFOLD, RN Outcome: Progressing 03/21/2024 0236 by Cesario Martina MANIFOLD, RN Outcome: Not Progressing   Problem: Safety: Goal: Ability to remain free from injury will improve 03/21/2024 0236 by Cesario Martina MANIFOLD, RN Outcome: Progressing 03/21/2024 0236 by Cesario Martina MANIFOLD, RN Outcome: Not Progressing   Problem: Skin Integrity: Goal: Risk for impaired skin integrity will decrease 03/21/2024 0236 by Cesario Martina MANIFOLD, RN Outcome: Progressing 03/21/2024 0236 by Cesario Martina MANIFOLD, RN Outcome: Not Progressing

## 2024-03-21 NOTE — Plan of Care (Signed)
   Problem: Education: Goal: Knowledge of General Education information will improve Description: Including pain rating scale, medication(s)/side effects and non-pharmacologic comfort measures Outcome: Progressing   Problem: Clinical Measurements: Goal: Ability to maintain clinical measurements within normal limits will improve Outcome: Progressing
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
## 2024-03-21 NOTE — Progress Notes (Signed)
   03/21/24 2041  BiPAP/CPAP/SIPAP  BiPAP/CPAP/SIPAP Pt Type Adult  Reason BIPAP/CPAP not in use Non-compliant (pt stated she didn't need cpap tonight)

## 2024-03-21 NOTE — Progress Notes (Signed)
 PT Cancellation Note  Patient Details Name: Kayla [name] MRN: [id number] DOB: 1941/03/31   Cancelled Treatment:    Reason Eval/Treat Not Completed: Other (comment) Pt refused on initial attempt due to nausea; on second attempt due to malaise.  Kayla [name], PT, DPT Acute Rehabilitation Services Office [phone]    Kayla [name] 03/21/2024, 12:42 PM

## 2024-03-21 NOTE — Progress Notes (Signed)
 Patient's BP running high at 160/135, HR 49, patient was asymptomatic, Dr Charlton notified and aware, Per MD, no intervention needed at this point, plan of care ongoing.   Daril ORN RN   03/21/24 04:32

## 2024-03-22 DIAGNOSIS — D72829 Elevated white blood cell count, unspecified: Secondary | ICD-10-CM | POA: Diagnosis not present

## 2024-03-22 DIAGNOSIS — J90 Pleural effusion, not elsewhere classified: Secondary | ICD-10-CM | POA: Diagnosis not present

## 2024-03-22 DIAGNOSIS — S2249XA Multiple fractures of ribs, unspecified side, initial encounter for closed fracture: Secondary | ICD-10-CM | POA: Diagnosis not present

## 2024-03-22 DIAGNOSIS — G9341 Metabolic encephalopathy: Secondary | ICD-10-CM | POA: Diagnosis not present

## 2024-03-22 LAB — GLUCOSE, CAPILLARY
Glucose-Capillary: 152 mg/dL — ABNORMAL HIGH (ref 70–99)
Glucose-Capillary: 141 mg/dL — ABNORMAL HIGH (ref 70–99)
Glucose-Capillary: 251 mg/dL — ABNORMAL HIGH (ref 70–99)
Glucose-Capillary: 131 mg/dL — ABNORMAL HIGH (ref 70–99)
Glucose-Capillary: 168 mg/dL — ABNORMAL HIGH (ref 70–99)

## 2024-03-22 LAB — RENAL FUNCTION PANEL
Albumin: 2.5 g/dL — ABNORMAL LOW (ref 3.5–5.0)
Anion gap: 15 (ref 5–15)
BUN: 50 mg/dL — ABNORMAL HIGH (ref 8–23)
CO2: 26 mmol/L (ref 22–32)
Calcium: 8.6 mg/dL — ABNORMAL LOW (ref 8.9–10.3)
Chloride: 95 mmol/L — ABNORMAL LOW (ref 98–111)
Creatinine, Ser: 3.25 mg/dL — ABNORMAL HIGH (ref 0.44–1.00)
GFR, Estimated: 14 mL/min — ABNORMAL LOW (ref >60)
Glucose, Bld: 153 mg/dL — ABNORMAL HIGH (ref 70–99)
Phosphorus: 6.5 mg/dL — ABNORMAL HIGH (ref 2.5–4.6)
Potassium: 4.1 mmol/L (ref 3.5–5.1)
Sodium: 136 mmol/L (ref 135–145)

## 2024-03-22 MED ORDER — KETOROLAC TROMETHAMINE 15 MG/ML IJ SOLN: 7.5000 mg | Freq: Once | INTRAMUSCULAR | Status: DC

## 2024-03-22 MED ORDER — SEVELAMER CARBONATE 800 MG PO TABS
800.0000 mg | ORAL_TABLET | Freq: Three times a day (TID) | ORAL | Status: DC
  Administered 2024-03-22 – 2024-03-24 (×5): 800 mg via ORAL
  Filled 2024-03-22 (×5): qty 1

## 2024-03-22 MED ORDER — KETOROLAC TROMETHAMINE 15 MG/ML IJ SOLN
15.0000 mg | Freq: Three times a day (TID) | INTRAMUSCULAR | Status: AC
  Administered 2024-03-22 – 2024-03-23 (×3): 15 mg via INTRAVENOUS
  Filled 2024-03-22 (×3): qty 1

## 2024-03-22 MED ORDER — HYDROCODONE-ACETAMINOPHEN 5-325 MG PO TABS
1.0000 | ORAL_TABLET | Freq: Four times a day (QID) | ORAL | Status: DC | PRN
  Administered 2024-03-22 (×2): 1 via ORAL
  Administered 2024-03-23 – 2024-03-24 (×5): 2 via ORAL
  Filled 2024-03-22 (×4): qty 2
  Filled 2024-03-22 (×2): qty 1
  Filled 2024-03-22: qty 2

## 2024-03-22 MED ORDER — METHOCARBAMOL 500 MG PO TABS
500.0000 mg | ORAL_TABLET | Freq: Four times a day (QID) | ORAL | Status: DC | PRN
  Administered 2024-03-22 – 2024-03-24 (×5): 500 mg via ORAL
  Filled 2024-03-22 (×5): qty 1
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
## 2024-03-22 NOTE — Care Management Important Message (Signed)
 Important Message  Patient Details  Name: Kayla [name] MRN: [id number] Date of Birth: June 10, 1940   Important Message Given:  Yes - Medicare IM     Claretta Deed 03/22/2024, 4:16 PM
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
## 2024-03-23 DIAGNOSIS — D72829 Elevated white blood cell count, unspecified: Secondary | ICD-10-CM | POA: Diagnosis not present

## 2024-03-23 DIAGNOSIS — S2249XA Multiple fractures of ribs, unspecified side, initial encounter for closed fracture: Secondary | ICD-10-CM | POA: Diagnosis not present

## 2024-03-23 DIAGNOSIS — J90 Pleural effusion, not elsewhere classified: Secondary | ICD-10-CM | POA: Diagnosis not present

## 2024-03-23 DIAGNOSIS — S2243XA Multiple fractures of ribs, bilateral, initial encounter for closed fracture: Secondary | ICD-10-CM

## 2024-03-23 DIAGNOSIS — G9341 Metabolic encephalopathy: Secondary | ICD-10-CM | POA: Diagnosis not present

## 2024-03-23 DIAGNOSIS — T68XXXA Hypothermia, initial encounter: Secondary | ICD-10-CM

## 2024-03-23 LAB — GLUCOSE, CAPILLARY
Glucose-Capillary: 202 mg/dL — ABNORMAL HIGH (ref 70–99)
Glucose-Capillary: 223 mg/dL — ABNORMAL HIGH (ref 70–99)
Glucose-Capillary: 231 mg/dL — ABNORMAL HIGH (ref 70–99)
Glucose-Capillary: 199 mg/dL — ABNORMAL HIGH (ref 70–99)
Glucose-Capillary: 155 mg/dL — ABNORMAL HIGH (ref 70–99)

## 2024-03-23 LAB — RENAL FUNCTION PANEL
Albumin: 2.4 g/dL — ABNORMAL LOW (ref 3.5–5.0)
Anion gap: 13 (ref 5–15)
BUN: 66 mg/dL — ABNORMAL HIGH (ref 8–23)
CO2: 28 mmol/L (ref 22–32)
Calcium: 8.4 mg/dL — ABNORMAL LOW (ref 8.9–10.3)
Chloride: 93 mmol/L — ABNORMAL LOW (ref 98–111)
Creatinine, Ser: 3.94 mg/dL — ABNORMAL HIGH (ref 0.44–1.00)
GFR, Estimated: 11 mL/min — ABNORMAL LOW (ref >60)
Glucose, Bld: 271 mg/dL — ABNORMAL HIGH (ref 70–99)
Phosphorus: 6.3 mg/dL — ABNORMAL HIGH (ref 2.5–4.6)
Potassium: 3.8 mmol/L (ref 3.5–5.1)
Sodium: 134 mmol/L — ABNORMAL LOW (ref 135–145)

## 2024-03-23 MED ORDER — MELATONIN 3 MG PO TABS
3.0000 mg | ORAL_TABLET | Freq: Once | ORAL | Status: AC
  Administered 2024-03-23: 3 mg via ORAL
  Filled 2024-03-23: qty 1

## 2024-03-23 MED ORDER — KETOROLAC TROMETHAMINE 15 MG/ML IJ SOLN
15.0000 mg | Freq: Three times a day (TID) | INTRAMUSCULAR | Status: AC
  Administered 2024-03-23 – 2024-03-24 (×3): 15 mg via INTRAVENOUS
  Filled 2024-03-23 (×3): qty 1
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
## 2024-03-23 NOTE — Inpatient Diabetes Management (Signed)
 Inpatient Diabetes Program Recommendations  AACE/ADA: New Consensus Statement on Inpatient Glycemic Control (2015)  Target Ranges:  Prepandial:   less than 140 mg/dL      Peak postprandial:   less than 180 mg/dL (1-2 hours)      Critically ill patients:  140 - 180 mg/dL   Lab Results  Component Value Date   GLUCAP 231 (H) 03/23/2024   HGBA1C 6.2 (H) 03/11/2024    Review of Glycemic Control  Latest Reference Range & Units 03/22/24 11:10 03/22/24 17:06 03/22/24 21:10 03/23/24 06:07 03/23/24 07:49 03/23/24 11:49  Glucose-Capillary 70 - 99 mg/dL 748 (H) 868 (H) 831 (H) 202 (H) 223 (H) 231 (H)   Diabetes history: DM 2 Outpatient Diabetes medications:  Lantus  45 units daily Novolog  0-20 units tid with meals Current orders for Inpatient glycemic control:  Novolog  0-15 units tid with meals and HS  Inpatient Diabetes Program Recommendations:    Please consider adding Semglee  15 units daily.   Thanks,  Randall Bullocks, RN, BC-ADM Inpatient Diabetes Coordinator Pager [phone]  (8a-5p)

## 2024-03-23 NOTE — Plan of Care (Signed)
  Problem: Coping: Goal: Ability to adjust to condition or change in health will improve Outcome: Not Progressing   Problem: Fluid Volume: Goal: Ability to maintain a balanced intake and output will improve Outcome: Progressing   Problem: Clinical Measurements: Goal: Ability to maintain clinical measurements within normal limits will improve Outcome: Progressing Goal: Will remain free from infection Outcome: Progressing Goal: Diagnostic test results will improve Outcome: Progressing Goal: Respiratory complications will improve Outcome: Progressing Goal: Cardiovascular complication will be avoided Outcome: Progressing
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
## 2024-03-24 ENCOUNTER — Other Ambulatory Visit (HOSPITAL_COMMUNITY): Payer: Self-pay

## 2024-03-24 DIAGNOSIS — A419 Sepsis, unspecified organism: Secondary | ICD-10-CM

## 2024-03-24 LAB — RENAL FUNCTION PANEL
Albumin: 2.4 g/dL — ABNORMAL LOW (ref 3.5–5.0)
Anion gap: 14 (ref 5–15)
BUN: 73 mg/dL — ABNORMAL HIGH (ref 8–23)
CO2: 24 mmol/L (ref 22–32)
Calcium: 8.4 mg/dL — ABNORMAL LOW (ref 8.9–10.3)
Chloride: 96 mmol/L — ABNORMAL LOW (ref 98–111)
Creatinine, Ser: 4.44 mg/dL — ABNORMAL HIGH (ref 0.44–1.00)
GFR, Estimated: 9 mL/min — ABNORMAL LOW (ref >60)
Glucose, Bld: 182 mg/dL — ABNORMAL HIGH (ref 70–99)
Phosphorus: 8 mg/dL — ABNORMAL HIGH (ref 2.5–4.6)
Potassium: 4.6 mmol/L (ref 3.5–5.1)
Sodium: 134 mmol/L — ABNORMAL LOW (ref 135–145)

## 2024-03-24 LAB — CBC
HCT: 37.6 % (ref 36.0–46.0)
Hemoglobin: 11.2 g/dL — ABNORMAL LOW (ref 12.0–15.0)
MCH: 28.9 pg (ref 26.0–34.0)
MCHC: 29.8 g/dL — ABNORMAL LOW (ref 30.0–36.0)
MCV: 97.2 fL (ref 80.0–100.0)
Platelets: 377 K/uL (ref 150–400)
RBC: 3.87 MIL/uL (ref 3.87–5.11)
RDW: 16.4 % — ABNORMAL HIGH (ref 11.5–15.5)
WBC: 10.6 K/uL — ABNORMAL HIGH (ref 4.0–10.5)
nRBC: 0 % (ref 0.0–0.2)

## 2024-03-24 LAB — GLUCOSE, CAPILLARY
Glucose-Capillary: 168 mg/dL — ABNORMAL HIGH (ref 70–99)
Glucose-Capillary: 192 mg/dL — ABNORMAL HIGH (ref 70–99)

## 2024-03-24 LAB — CULTURE, BLOOD (ROUTINE X 2): Culture: NO GROWTH

## 2024-03-24 MED ORDER — MIDODRINE HCL 5 MG PO TABS
ORAL_TABLET | ORAL | Status: AC
  Filled 2024-03-24: qty 2

## 2024-03-24 MED ORDER — HEPARIN SODIUM (PORCINE) 1000 UNIT/ML IJ SOLN
INTRAMUSCULAR | Status: AC
  Filled 2024-03-24: qty 4

## 2024-03-24 MED ORDER — LANTUS SOLOSTAR 100 UNIT/ML ~~LOC~~ SOPN
15.0000 [IU] | PEN_INJECTOR | Freq: Every day | SUBCUTANEOUS | 0 refills | Status: AC
  Filled 2024-03-24: qty 15, 100d supply, fill #0

## 2024-03-24 MED ORDER — METHOCARBAMOL 500 MG PO TABS
500.0000 mg | ORAL_TABLET | Freq: Three times a day (TID) | ORAL | 0 refills | Status: DC | PRN
  Filled 2024-03-24: qty 20, 7d supply, fill #0

## 2024-03-24 MED ORDER — HYDROCODONE-ACETAMINOPHEN 5-325 MG PO TABS
ORAL_TABLET | ORAL | Status: AC
  Filled 2024-03-24: qty 1

## 2024-03-24 MED ORDER — LIDOCAINE 5 % EX PTCH
1.0000 | MEDICATED_PATCH | CUTANEOUS | 0 refills | Status: DC
  Filled 2024-03-24: qty 15, 15d supply, fill #0

## 2024-03-24 MED ORDER — INSULIN LISPRO (1 UNIT DIAL) 100 UNIT/ML (KWIKPEN)
0.0000 [IU] | PEN_INJECTOR | SUBCUTANEOUS | 1 refills | Status: AC
  Filled 2024-03-24: qty 15, 30d supply, fill #0

## 2024-03-24 MED ORDER — HYDROCODONE-ACETAMINOPHEN 5-325 MG PO TABS
1.0000 | ORAL_TABLET | Freq: Four times a day (QID) | ORAL | 0 refills | Status: DC | PRN
  Filled 2024-03-24: qty 20, 3d supply, fill #0

## 2024-03-24 MED ORDER — ALTEPLASE 2 MG IJ SOLR
2.0000 mg | Freq: Once | INTRAMUSCULAR | Status: DC | PRN
Start: 2024-03-24 — End: 2024-03-24

## 2024-03-24 MED ORDER — HEPARIN SODIUM (PORCINE) 1000 UNIT/ML DIALYSIS
1000.0000 [IU] | INTRAMUSCULAR | Status: DC | PRN
Start: 2024-03-24 — End: 2024-03-24

## 2024-03-24 MED ORDER — HYDROCODONE-ACETAMINOPHEN 5-325 MG PO TABS
1.0000 | ORAL_TABLET | Freq: Once | ORAL | Status: AC
  Administered 2024-03-24: 1 via ORAL
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
## 2024-03-24 NOTE — Procedures (Signed)
 HD Note:  Some information was entered later than the data was gathered due to patient care needs. The stated time with the data is accurate.  Received patient in bed to unit.   Alert and oriented to self and situation  Informed consent signed and in chart.   Access used: Right upper chest HD tunneled catheter Access issues: No issues  Lines reversed, high arterial pressures.  Patient BP was soft at beginning of treatment, midodrine  given.  Patient had pain in her ribs, medication given, see MAR  TX duration: 3.5 hours  Alert, without acute distress.    Hand-off given to patient's nurse.   Transported back to the room   Macey Wurtz L. Lenon, RN Kidney Dialysis Unit.
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
## 2024-03-24 NOTE — Progress Notes (Signed)
 OT Cancellation Note  Patient Details Name: Kayla [name] MRN: [id number] DOB: 1940/05/31   Cancelled Treatment:    Reason Eval/Treat Not Completed: Other (comment) (Pt in HD, will continue efforts as able.)   Kayla [name], OTR/L [facility] Acute Rehabilitation Services [phone] Secure Chat Preferred  Barlow Harrison 03/24/2024, 9:35 AM

## 2024-03-24 NOTE — TOC Transition Note (Signed)
 Transition of Care [facility]) - Discharge Note   Patient Details  Name: Kayla [name] MRN: [id number] Date of Birth: 11/04/1940  [facility] CM/SW Contact:  [name], RN Phone Number: 03/24/2024, 2:38 PM   Clinical Narrative:     Patient discharging to home with husband today, who will be providing transportation. Rolling walker delivered to room via [employer]. HH arranged with [facility] for therapy and nursing for wound care. Info on AVS.   CM spoke with bedside nurse about teaching spouse and patient on wound care and providing supplies to get through until [facility] agency comes.   No further needs identified by CM.  Final next level of care: Home w Home Health Services Barriers to Discharge: No Barriers Identified   Patient Goals and CMS Choice   CMS Medicare.gov Compare Post Acute Care list provided to:: Patient Choice offered to / list presented to : Patient      Discharge Placement                       Discharge Plan and Services Additional resources added to the After Visit Summary for                  DME Arranged: Walker rolling DME Agency: [employer] Date DME Agency Contacted: 03/21/24 Time DME Agency Contacted: 1436 Representative spoke with at DME Agency: Zack HH Arranged: RN, PT, OT HH Agency: [facility] Date [facility] Agency Contacted: 03/24/24 Time HH Agency Contacted: 1438 Representative spoke with at [facility] Agency: Joane  Social Drivers of Health (SDOH) Interventions SDOH Screenings   Food Insecurity: No Food Insecurity (03/20/2024)  Housing: Low Risk  (03/20/2024)  Recent Concern: Housing - High Risk (01/19/2024)  Transportation Needs: No Transportation Needs (03/20/2024)  Utilities: Not At Risk (03/20/2024)  Alcohol  Screen: Low Risk  (01/31/2021)  Depression (PHQ2-9): Low Risk  (12/15/2022)  Financial Resource Strain: Low Risk  (01/31/2021)  Physical Activity: Insufficiently Active (09/30/2021)  Social Connections:  Moderately Integrated (03/20/2024)  Stress: No Stress Concern Present (09/30/2021)  Tobacco Use: Medium Risk (03/13/2024)     Readmission Risk Interventions    03/13/2024    3:53 PM 01/19/2024   11:24 AM 11/14/2022    9:23 AM  Readmission Risk Prevention Plan  Transportation Screening Complete Complete Complete  PCP or Specialist Appt within 5-7 Days Complete    Home Care Screening Complete    Medication Review (RN CM) Referral to Pharmacy    Medication Review (RN Care Manager)  Referral to Pharmacy Complete  PCP or Specialist appointment within 3-5 days of discharge   Complete  HRI or Home Care Consult  Complete Complete  SW Recovery Care/Counseling Consult  Complete Complete  Palliative Care Screening  Not Applicable Not Applicable  Skilled Nursing Facility  Not Applicable Not Applicable

## 2024-03-24 NOTE — Plan of Care (Signed)
  Problem: Coping: Goal: Ability to adjust to condition or change in health will improve Outcome: Progressing   Problem: Nutritional: Goal: Maintenance of adequate nutrition will improve Outcome: Progressing Goal: Progress toward achieving an optimal weight will improve Outcome: Progressing   Problem: Clinical Measurements: Goal: Ability to maintain clinical measurements within normal limits will improve Outcome: Progressing Goal: Will remain free from infection Outcome: Progressing Goal: Diagnostic test results will improve Outcome: Progressing Goal: Respiratory complications will improve Outcome: Progressing Goal: Cardiovascular complication will be avoided Outcome: Progressing
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
## 2024-03-27 ENCOUNTER — Telehealth: Payer: Self-pay | Admitting: Nephrology

## 2024-03-27 NOTE — Telephone Encounter (Signed)
 Transition of care contact from inpatient facility  Date of Discharge: 03/24/24 Date of Contact: 03/27/24 Method of contact: Phone  Attempted to contact patient to discuss transition of care from inpatient admission. Patient did not answer the phone. Message was left on the patient's voicemail. Will continue outreach attempts.

## 2024-03-27 NOTE — Progress Notes (Signed)
 Late Note Entry- Mar 27, 2024  Pt was d/c to home on Friday. Dialysis Navigator, Abigail, contacted [facility] on Friday to advise clinic of pt's d/c date and that pt should have resumed care yesterday due to holiday schedule.   Randine Mungo Dialysis Navigator [phone]

## 2024-04-12 ENCOUNTER — Ambulatory Visit: Payer: Self-pay | Admitting: Pulmonary Disease

## 2024-04-12 ENCOUNTER — Ambulatory Visit (INDEPENDENT_AMBULATORY_CARE_PROVIDER_SITE_OTHER): Admitting: Pulmonary Disease

## 2024-04-12 ENCOUNTER — Encounter: Payer: Self-pay | Admitting: Pulmonary Disease

## 2024-04-12 ENCOUNTER — Ambulatory Visit

## 2024-04-12 VITALS — BP 113/60 | HR 83 | Temp 98.2°F | Ht 65.0 in | Wt 182.8 lb

## 2024-04-12 DIAGNOSIS — J9611 Chronic respiratory failure with hypoxia: Secondary | ICD-10-CM

## 2024-04-12 DIAGNOSIS — G4730 Sleep apnea, unspecified: Secondary | ICD-10-CM | POA: Diagnosis not present

## 2024-04-12 DIAGNOSIS — J449 Chronic obstructive pulmonary disease, unspecified: Secondary | ICD-10-CM

## 2024-04-12 DIAGNOSIS — Z8709 Personal history of other diseases of the respiratory system: Secondary | ICD-10-CM

## 2024-04-12 DIAGNOSIS — R0602 Shortness of breath: Secondary | ICD-10-CM

## 2024-04-12 DIAGNOSIS — Z87891 Personal history of nicotine dependence: Secondary | ICD-10-CM

## 2024-04-12 NOTE — Patient Instructions (Addendum)
 I will see you back in about 6 to 8 weeks  We will get a chest x-ray today We will update you with results of the x-ray  You can use your nebulizer up to 4 times a day   Call us  with significant concerns
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
## 2024-04-12 NOTE — Telephone Encounter (Signed)
 [email or web address] Pulmonary Triage - Initial Assessment Questions Chief Complaint (e.g., cough, sob, wheezing, fever, chills, sweat or additional symptoms) *Go to specific symptom protocol after initial questions. SOB, chest tight and cough.  How long have symptoms been present? X 1 week  Have you tested for COVID or Flu? Note: If not, ask patient if a home test can be taken. If so, instruct patient to call back for positive results. No  MEDICINES:   Have you used any OTC meds to help with symptoms? No If yes, ask What medications? N/A.  Have you used your inhalers/maintenance medication? Yes If yes, What medications? Breztri  2 puffs bid; albuterol  inhaler 2 puffs bid; albuterol  nebulizer q3hours    OXYGEN : Do you wear supplemental oxygen ? Yes If yes, How many liters are you supposed to use? 3 L  Do you monitor your oxygen  levels? Yes If yes, What is your reading (oxygen  level) today? 98%  What is your usual oxygen  saturation reading?  (Note: Pulmonary O2 sats should be 90% or greater) 90-91%       Copied from CRM #[id number]. Topic: Clinical - Red Word Triage >> Apr 12, 2024 12:39 PM Kayla [name] wrote: Red Word that prompted transfer to Nurse Triage: Patient is having SOB similar to when she has had fluid in her lungs in the past. She is also experiencing pain from the tightness in her lungs. Also experiencing pain from broken ribs & was in ED on 11/16. Reason for Disposition  [1] Longstanding difficulty breathing (e.g., CHF, COPD, emphysema) AND [2] WORSE than normal  Answer Assessment - Initial Assessment Questions E2C2 Pulmonary Triage - Initial Assessment Questions Chief Complaint (e.g., cough, sob, wheezing, fever, chills, sweat or additional symptoms) *Go to specific symptom protocol after initial questions. SOB, chest tight and cough.  How long have symptoms been present? X 1 week  Have you tested for COVID or Flu? Note: If not, ask patient if a home  test can be taken. If so, instruct patient to call back for positive results. No  MEDICINES:   Have you used any OTC meds to help with symptoms? No If yes, ask What medications? N/A.  Have you used your inhalers/maintenance medication? Yes If yes, What medications? Breztri  2 puffs bid; albuterol  inhaler 2 puffs bid; albuterol  nebulizer q3hours    OXYGEN : Do you wear supplemental oxygen ? Yes If yes, How many liters are you supposed to use? 3 L  Do you monitor your oxygen  levels? Yes If yes, What is your reading (oxygen  level) today? 98%  What is your usual oxygen  saturation reading?  (Note: Pulmonary O2 sats should be 90% or greater) 90-91%        1. RESPIRATORY STATUS: Describe your breathing? (e.g., wheezing, shortness of breath, unable to speak, severe coughing)      SOB, cough with bright red bloody chunk of mucous noted today, wheezing.  2. ONSET: When did this breathing problem begin?      X 1 week.  3. PATTERN Does the difficult breathing come and go, or has it been constant since it started?      Constant. Patient just completed an albuterol  nebulizer and is able to speak in a full sentence and no wheezing or labored breathing noted at this time.  4. SEVERITY: How bad is your breathing? (e.g., mild, moderate, severe)      Worsened from baseline COPD.   5. RECURRENT SYMPTOM: Have you had difficulty breathing before? If Yes, ask: When was the last time? and What  happened that time?      Yes, recently 03/19/24 admission.  6. CARDIAC HISTORY: Do you have any history of heart disease? (e.g., heart attack, angina, bypass surgery, angioplasty)      Afib, HTN, CHF  7. LUNG HISTORY: Do you have any history of lung disease?  (e.g., pulmonary embolus, asthma, emphysema)     OSA, COPD, pleural effusion.  8. CAUSE: What do you think is causing the breathing problem?      Bronchitis or fluid building up again on her lungs.  9.  OTHER SYMPTOMS: Do you have any other symptoms? (e.g., chest pain, cough, dizziness, fever, runny nose)     Chest tightness (states this feels similar to the tightness when she had fluid on her lungs, not cardiac), nosebleed x 1 episode today that lasted 1-2 minutes and has resolved.  10. O2 SATURATION MONITOR:  Do you use an oxygen  saturation monitor (pulse oximeter) at home? If Yes, ask: What is your reading (oxygen  level) today? What is your usual oxygen  saturation reading? (e.g., 95%)       Yes, 98%. Usual baseline is 90-91%.  11. PREGNANCY: Is there any chance you are pregnant? When was your last menstrual period?       N/A.  12. TRAVEL: Have you traveled out of the country in the last month? (e.g., travel history, exposures)       No.  Protocols used: Breathing Difficulty-A-AH

## 2024-04-14 ENCOUNTER — Encounter: Payer: Self-pay | Admitting: Cardiology

## 2024-04-21 ENCOUNTER — Telehealth: Payer: Self-pay

## 2024-04-21 NOTE — Telephone Encounter (Signed)
 Copied from CRM #[id number]. Topic: Clinical - Lab/Test Results >> Apr 21, 2024  8:49 AM Isabell A wrote: Reason for CRM: Spouse calling on behalf of pt for x-ray results.   Callback number: [phone]   Dr. Neda can you please advise of x-ray results 12/10.

## 2024-04-25 ENCOUNTER — Other Ambulatory Visit: Payer: Self-pay | Admitting: Pulmonary Disease

## 2024-04-25 ENCOUNTER — Other Ambulatory Visit (HOSPITAL_COMMUNITY): Payer: Self-pay

## 2024-04-25 DIAGNOSIS — J90 Pleural effusion, not elsewhere classified: Secondary | ICD-10-CM

## 2024-04-25 NOTE — Telephone Encounter (Signed)
 Placed order for IR thoracentesis

## 2024-04-27 ENCOUNTER — Encounter (HOSPITAL_COMMUNITY): Payer: Self-pay | Admitting: Emergency Medicine

## 2024-04-27 ENCOUNTER — Emergency Department (HOSPITAL_COMMUNITY)

## 2024-04-27 ENCOUNTER — Inpatient Hospital Stay (HOSPITAL_COMMUNITY)
Admission: EM | Admit: 2024-04-27 | Discharge: 2024-05-03 | DRG: 291 | Disposition: A | Discharge status: Home Health | Attending: Family Medicine | Admitting: Family Medicine

## 2024-04-27 ENCOUNTER — Other Ambulatory Visit: Payer: Self-pay

## 2024-04-27 DIAGNOSIS — E785 Hyperlipidemia, unspecified: Secondary | ICD-10-CM | POA: Diagnosis present

## 2024-04-27 DIAGNOSIS — F419 Anxiety disorder, unspecified: Secondary | ICD-10-CM | POA: Diagnosis present

## 2024-04-27 DIAGNOSIS — I132 Hypertensive heart and chronic kidney disease with heart failure and with stage 5 chronic kidney disease, or end stage renal disease: Principal | ICD-10-CM | POA: Diagnosis present

## 2024-04-27 DIAGNOSIS — I071 Rheumatic tricuspid insufficiency: Secondary | ICD-10-CM | POA: Diagnosis present

## 2024-04-27 DIAGNOSIS — J918 Pleural effusion in other conditions classified elsewhere: Secondary | ICD-10-CM | POA: Diagnosis present

## 2024-04-27 DIAGNOSIS — J449 Chronic obstructive pulmonary disease, unspecified: Secondary | ICD-10-CM | POA: Diagnosis not present

## 2024-04-27 DIAGNOSIS — Z992 Dependence on renal dialysis: Secondary | ICD-10-CM

## 2024-04-27 DIAGNOSIS — D631 Anemia in chronic kidney disease: Secondary | ICD-10-CM | POA: Diagnosis present

## 2024-04-27 DIAGNOSIS — Z808 Family history of malignant neoplasm of other organs or systems: Secondary | ICD-10-CM

## 2024-04-27 DIAGNOSIS — Z9842 Cataract extraction status, left eye: Secondary | ICD-10-CM

## 2024-04-27 DIAGNOSIS — J9621 Acute and chronic respiratory failure with hypoxia: Secondary | ICD-10-CM | POA: Diagnosis present

## 2024-04-27 DIAGNOSIS — G4733 Obstructive sleep apnea (adult) (pediatric): Secondary | ICD-10-CM | POA: Diagnosis present

## 2024-04-27 DIAGNOSIS — Z79899 Other long term (current) drug therapy: Secondary | ICD-10-CM

## 2024-04-27 DIAGNOSIS — I2723 Pulmonary hypertension due to lung diseases and hypoxia: Secondary | ICD-10-CM | POA: Diagnosis present

## 2024-04-27 DIAGNOSIS — Z888 Allergy status to other drugs, medicaments and biological substances status: Secondary | ICD-10-CM

## 2024-04-27 DIAGNOSIS — I495 Sick sinus syndrome: Secondary | ICD-10-CM | POA: Diagnosis present

## 2024-04-27 DIAGNOSIS — E1142 Type 2 diabetes mellitus with diabetic polyneuropathy: Secondary | ICD-10-CM | POA: Diagnosis present

## 2024-04-27 DIAGNOSIS — I2722 Pulmonary hypertension due to left heart disease: Secondary | ICD-10-CM | POA: Diagnosis present

## 2024-04-27 DIAGNOSIS — I5033 Acute on chronic diastolic (congestive) heart failure: Secondary | ICD-10-CM | POA: Diagnosis present

## 2024-04-27 DIAGNOSIS — I480 Paroxysmal atrial fibrillation: Secondary | ICD-10-CM | POA: Diagnosis present

## 2024-04-27 DIAGNOSIS — Z885 Allergy status to narcotic agent status: Secondary | ICD-10-CM

## 2024-04-27 DIAGNOSIS — Z7901 Long term (current) use of anticoagulants: Secondary | ICD-10-CM | POA: Diagnosis not present

## 2024-04-27 DIAGNOSIS — R0602 Shortness of breath: Secondary | ICD-10-CM | POA: Diagnosis present

## 2024-04-27 DIAGNOSIS — Z89422 Acquired absence of other left toe(s): Secondary | ICD-10-CM

## 2024-04-27 DIAGNOSIS — Z8249 Family history of ischemic heart disease and other diseases of the circulatory system: Secondary | ICD-10-CM

## 2024-04-27 DIAGNOSIS — Z794 Long term (current) use of insulin: Secondary | ICD-10-CM | POA: Diagnosis not present

## 2024-04-27 DIAGNOSIS — Z961 Presence of intraocular lens: Secondary | ICD-10-CM | POA: Diagnosis present

## 2024-04-27 DIAGNOSIS — I2510 Atherosclerotic heart disease of native coronary artery without angina pectoris: Secondary | ICD-10-CM | POA: Diagnosis present

## 2024-04-27 DIAGNOSIS — N2581 Secondary hyperparathyroidism of renal origin: Secondary | ICD-10-CM | POA: Diagnosis present

## 2024-04-27 DIAGNOSIS — I509 Heart failure, unspecified: Principal | ICD-10-CM

## 2024-04-27 DIAGNOSIS — E890 Postprocedural hypothyroidism: Secondary | ICD-10-CM | POA: Diagnosis present

## 2024-04-27 DIAGNOSIS — Z87891 Personal history of nicotine dependence: Secondary | ICD-10-CM

## 2024-04-27 DIAGNOSIS — J441 Chronic obstructive pulmonary disease with (acute) exacerbation: Secondary | ICD-10-CM | POA: Diagnosis present

## 2024-04-27 DIAGNOSIS — E1122 Type 2 diabetes mellitus with diabetic chronic kidney disease: Secondary | ICD-10-CM | POA: Diagnosis present

## 2024-04-27 DIAGNOSIS — I493 Ventricular premature depolarization: Secondary | ICD-10-CM | POA: Diagnosis present

## 2024-04-27 DIAGNOSIS — Z801 Family history of malignant neoplasm of trachea, bronchus and lung: Secondary | ICD-10-CM

## 2024-04-27 DIAGNOSIS — Z96652 Presence of left artificial knee joint: Secondary | ICD-10-CM | POA: Diagnosis present

## 2024-04-27 DIAGNOSIS — Z9049 Acquired absence of other specified parts of digestive tract: Secondary | ICD-10-CM

## 2024-04-27 DIAGNOSIS — Z66 Do not resuscitate: Secondary | ICD-10-CM | POA: Diagnosis present

## 2024-04-27 DIAGNOSIS — J90 Pleural effusion, not elsewhere classified: Principal | ICD-10-CM

## 2024-04-27 DIAGNOSIS — Z1152 Encounter for screening for COVID-19: Secondary | ICD-10-CM

## 2024-04-27 DIAGNOSIS — Z833 Family history of diabetes mellitus: Secondary | ICD-10-CM

## 2024-04-27 DIAGNOSIS — Z981 Arthrodesis status: Secondary | ICD-10-CM

## 2024-04-27 DIAGNOSIS — Z9841 Cataract extraction status, right eye: Secondary | ICD-10-CM

## 2024-04-27 DIAGNOSIS — I2489 Other forms of acute ischemic heart disease: Secondary | ICD-10-CM | POA: Diagnosis present

## 2024-04-27 DIAGNOSIS — Z90710 Acquired absence of both cervix and uterus: Secondary | ICD-10-CM

## 2024-04-27 DIAGNOSIS — Z9981 Dependence on supplemental oxygen: Secondary | ICD-10-CM

## 2024-04-27 DIAGNOSIS — Z48813 Encounter for surgical aftercare following surgery on the respiratory system: Secondary | ICD-10-CM | POA: Diagnosis not present

## 2024-04-27 DIAGNOSIS — J690 Pneumonitis due to inhalation of food and vomit: Secondary | ICD-10-CM | POA: Diagnosis present

## 2024-04-27 DIAGNOSIS — N186 End stage renal disease: Secondary | ICD-10-CM | POA: Diagnosis present

## 2024-04-27 DIAGNOSIS — Z950 Presence of cardiac pacemaker: Secondary | ICD-10-CM

## 2024-04-27 DIAGNOSIS — Z79890 Hormone replacement therapy (postmenopausal): Secondary | ICD-10-CM

## 2024-04-27 LAB — CBC
HCT: 33.4 % — ABNORMAL LOW (ref 36.0–46.0)
Hemoglobin: 10 g/dL — ABNORMAL LOW (ref 12.0–15.0)
MCH: 30.3 pg (ref 26.0–34.0)
MCHC: 29.9 g/dL — ABNORMAL LOW (ref 30.0–36.0)
MCV: 101.2 fL — ABNORMAL HIGH (ref 80.0–100.0)
Platelets: 354 K/uL (ref 150–400)
RBC: 3.3 MIL/uL — ABNORMAL LOW (ref 3.87–5.11)
RDW: 15.7 % — ABNORMAL HIGH (ref 11.5–15.5)
WBC: 12.3 K/uL — ABNORMAL HIGH (ref 4.0–10.5)
nRBC: 0 % (ref 0.0–0.2)

## 2024-04-27 LAB — BASIC METABOLIC PANEL WITH GFR
Anion gap: 14 (ref 5–15)
BUN: 41 mg/dL — ABNORMAL HIGH (ref 8–23)
CO2: 28 mmol/L (ref 22–32)
Calcium: 8.3 mg/dL — ABNORMAL LOW (ref 8.9–10.3)
Chloride: 99 mmol/L (ref 98–111)
Creatinine, Ser: 3.16 mg/dL — ABNORMAL HIGH (ref 0.44–1.00)
GFR, Estimated: 14 mL/min — ABNORMAL LOW
Glucose, Bld: 255 mg/dL — ABNORMAL HIGH (ref 70–99)
Potassium: 3.8 mmol/L (ref 3.5–5.1)
Sodium: 141 mmol/L (ref 135–145)

## 2024-04-27 LAB — PRO BRAIN NATRIURETIC PEPTIDE: Pro Brain Natriuretic Peptide: 9246 pg/mL — ABNORMAL HIGH

## 2024-04-27 LAB — TROPONIN T, HIGH SENSITIVITY
Troponin T High Sensitivity: 137 ng/L (ref 0–19)
Troponin T High Sensitivity: 140 ng/L (ref 0–19)

## 2024-04-27 LAB — CBG MONITORING, ED: Glucose-Capillary: 173 mg/dL — ABNORMAL HIGH (ref 70–99)

## 2024-04-27 LAB — RESP PANEL BY RT-PCR (RSV, FLU A&B, COVID)  RVPGX2
Influenza A by PCR: NEGATIVE
Influenza B by PCR: NEGATIVE
Resp Syncytial Virus by PCR: NEGATIVE
SARS Coronavirus 2 by RT PCR: NEGATIVE

## 2024-04-27 LAB — MAGNESIUM: Magnesium: 1.5 mg/dL — ABNORMAL LOW (ref 1.7–2.4)

## 2024-04-27 LAB — TSH: TSH: 6.93 u[IU]/mL — ABNORMAL HIGH (ref 0.350–4.500)

## 2024-04-27 MED ORDER — ALBUTEROL SULFATE (2.5 MG/3ML) 0.083% IN NEBU
2.5000 mg | INHALATION_SOLUTION | RESPIRATORY_TRACT | Status: DC | PRN
  Administered 2024-04-28 – 2024-05-03 (×7): 2.5 mg via RESPIRATORY_TRACT
  Filled 2024-04-27 (×5): qty 3

## 2024-04-27 MED ORDER — LEVOTHYROXINE SODIUM 100 MCG PO TABS
200.0000 ug | ORAL_TABLET | ORAL | Status: DC
  Administered 2024-04-29 – 2024-05-03 (×4): 200 ug via ORAL
  Filled 2024-04-27 (×2): qty 2

## 2024-04-27 MED ORDER — SODIUM CHLORIDE 0.9% FLUSH
3.0000 mL | Freq: Two times a day (BID) | INTRAVENOUS | Status: DC
  Administered 2024-04-28 – 2024-05-03 (×11): 3 mL via INTRAVENOUS

## 2024-04-27 MED ORDER — AMLODIPINE BESYLATE 5 MG PO TABS
5.0000 mg | ORAL_TABLET | Freq: Every day | ORAL | Status: DC
  Administered 2024-04-29 – 2024-05-03 (×5): 5 mg via ORAL
  Filled 2024-04-27 (×2): qty 1

## 2024-04-27 MED ORDER — ISOSORBIDE MONONITRATE ER 60 MG PO TB24
60.0000 mg | ORAL_TABLET | Freq: Every day | ORAL | Status: DC
  Administered 2024-04-28 – 2024-05-03 (×6): 60 mg via ORAL
  Filled 2024-04-27: qty 2
  Filled 2024-04-27 (×2): qty 1

## 2024-04-27 MED ORDER — NITROGLYCERIN 0.4 MG SL SUBL: 0.4000 mg | SUBLINGUAL_TABLET | SUBLINGUAL | Status: DC | PRN

## 2024-04-27 MED ORDER — APIXABAN 2.5 MG PO TABS
2.5000 mg | ORAL_TABLET | Freq: Two times a day (BID) | ORAL | Status: DC
  Administered 2024-04-27 – 2024-05-03 (×12): 2.5 mg via ORAL
  Filled 2024-04-27 (×7): qty 1

## 2024-04-27 MED ORDER — FUROSEMIDE 10 MG/ML IJ SOLN: 40.0000 mg | Freq: Two times a day (BID) | INTRAMUSCULAR | Status: DC

## 2024-04-27 MED ORDER — ATORVASTATIN CALCIUM 40 MG PO TABS
40.0000 mg | ORAL_TABLET | Freq: Every day | ORAL | Status: DC
  Administered 2024-04-28 – 2024-05-03 (×6): 40 mg via ORAL
  Filled 2024-04-27: qty 1
  Filled 2024-04-27: qty 4
  Filled 2024-04-27: qty 1

## 2024-04-27 MED ORDER — INSULIN ASPART 100 UNIT/ML IJ SOLN
0.0000 [IU] | Freq: Three times a day (TID) | INTRAMUSCULAR | Status: DC
  Administered 2024-04-28: 2 [IU] via SUBCUTANEOUS
  Administered 2024-04-28: 3 [IU] via SUBCUTANEOUS
  Administered 2024-04-28: 2 [IU] via SUBCUTANEOUS
  Administered 2024-04-29: 3 [IU] via SUBCUTANEOUS
  Administered 2024-04-29: 2 [IU] via SUBCUTANEOUS
  Administered 2024-04-30: 8 [IU] via SUBCUTANEOUS
  Administered 2024-04-30 (×2): 5 [IU] via SUBCUTANEOUS
  Administered 2024-05-01: 3 [IU] via SUBCUTANEOUS
  Administered 2024-05-01 (×2): 5 [IU] via SUBCUTANEOUS
  Administered 2024-05-02: 11 [IU] via SUBCUTANEOUS
  Administered 2024-05-02: 5 [IU] via SUBCUTANEOUS
  Administered 2024-05-02: 8 [IU] via SUBCUTANEOUS
  Administered 2024-05-03: 3 [IU] via SUBCUTANEOUS
  Filled 2024-04-27 (×2): qty 5
  Filled 2024-04-27: qty 7
  Filled 2024-04-27: qty 2
  Filled 2024-04-27: qty 3
  Filled 2024-04-27: qty 2
  Filled 2024-04-27: qty 1

## 2024-04-27 MED ORDER — ACETAMINOPHEN 325 MG PO TABS
650.0000 mg | ORAL_TABLET | ORAL | Status: DC | PRN
  Administered 2024-05-01 – 2024-05-03 (×3): 650 mg via ORAL

## 2024-04-27 MED ORDER — FUROSEMIDE 10 MG/ML IJ SOLN
60.0000 mg | Freq: Two times a day (BID) | INTRAMUSCULAR | Status: DC
  Administered 2024-04-28: 60 mg via INTRAVENOUS
  Filled 2024-04-27: qty 6

## 2024-04-27 MED ORDER — SODIUM CHLORIDE 0.9 % IV SOLN: 250.0000 mL | INTRAVENOUS | Status: AC | PRN

## 2024-04-27 MED ORDER — LEVOTHYROXINE SODIUM 100 MCG PO TABS: 100.0000 ug | ORAL_TABLET | ORAL | Status: DC

## 2024-04-27 MED ORDER — BUDESON-GLYCOPYRROL-FORMOTEROL 160-9-4.8 MCG/ACT IN AERO
2.0000 | INHALATION_SPRAY | Freq: Every day | RESPIRATORY_TRACT | Status: DC
  Administered 2024-04-28: 2 via RESPIRATORY_TRACT
  Filled 2024-04-27: qty 5.9

## 2024-04-27 MED ORDER — HYDROCODONE-ACETAMINOPHEN 5-325 MG PO TABS
1.0000 | ORAL_TABLET | Freq: Four times a day (QID) | ORAL | Status: DC | PRN
  Administered 2024-04-27: 2 via ORAL
  Administered 2024-04-28 (×2): 1 via ORAL
  Filled 2024-04-27: qty 1
  Filled 2024-04-27 (×2): qty 2

## 2024-04-27 MED ORDER — INSULIN ASPART 100 UNIT/ML IJ SOLN
0.0000 [IU] | Freq: Every day | INTRAMUSCULAR | Status: DC
  Administered 2024-04-29: 5 [IU] via SUBCUTANEOUS
  Administered 2024-04-30 – 2024-05-02 (×2): 3 [IU] via SUBCUTANEOUS
  Filled 2024-04-27 (×2): qty 1

## 2024-04-27 MED ORDER — ESCITALOPRAM OXALATE 10 MG PO TABS
10.0000 mg | ORAL_TABLET | Freq: Every day | ORAL | Status: DC
  Administered 2024-04-28 – 2024-05-03 (×6): 10 mg via ORAL
  Filled 2024-04-27 (×3): qty 1

## 2024-04-27 MED ORDER — LEVOTHYROXINE SODIUM 100 MCG PO TABS
100.0000 ug | ORAL_TABLET | ORAL | Status: DC
  Administered 2024-04-28 – 2024-05-01 (×2): 100 ug via ORAL
  Filled 2024-04-27: qty 1

## 2024-04-27 MED ORDER — PANTOPRAZOLE SODIUM 40 MG PO TBEC
40.0000 mg | DELAYED_RELEASE_TABLET | Freq: Every day | ORAL | Status: DC
  Administered 2024-04-28 – 2024-05-03 (×6): 40 mg via ORAL
  Filled 2024-04-27 (×3): qty 1

## 2024-04-27 MED ORDER — MAGNESIUM SULFATE 2 GM/50ML IV SOLN
2.0000 g | Freq: Once | INTRAVENOUS | Status: AC
  Administered 2024-04-28: 2 g via INTRAVENOUS
  Filled 2024-04-27: qty 50

## 2024-04-27 MED ORDER — ONDANSETRON HCL 4 MG/2ML IJ SOLN
4.0000 mg | Freq: Four times a day (QID) | INTRAMUSCULAR | Status: DC | PRN
  Administered 2024-05-03: 4 mg via INTRAVENOUS

## 2024-04-27 MED ORDER — SODIUM CHLORIDE 0.9% FLUSH: 3.0000 mL | INTRAVENOUS | Status: DC | PRN

## 2024-04-27 MED ORDER — FUROSEMIDE 10 MG/ML IJ SOLN
60.0000 mg | Freq: Once | INTRAMUSCULAR | Status: AC
  Administered 2024-04-27: 60 mg via INTRAVENOUS
  Filled 2024-04-27: qty 6

## 2024-04-27 NOTE — ED Triage Notes (Signed)
 Pt is home O2 dependent at 3L.

## 2024-04-27 NOTE — ED Triage Notes (Signed)
 Patient reports persistent SOB , occasional productive cough this week , history of COPD .

## 2024-04-27 NOTE — H&P (Signed)
 " History and Physical    SIERRIA [name] FMW:[id number] DOB: 08/08/1940 DOA: 04/27/2024  PCP: Stephanie Charlene CROME, MD  Patient coming from: Home  I have personally briefly reviewed patient's old medical records in [facility]  Chief Complaint: Worsening shortness of breath  HPI: Kayla [name] is a 83 y.o. female with medical history significant of ESRD on HD (Mondays and Fridays), hypertension, hyperlipidemia, CAD, CHF, paroxysmal atrial fibrillation on Eliquis , SSS s/p PM, carotid artery disease, COPD, diabetes mellitus type 2 with polyneuropathy, anemia of chronic disease, hypothyroidism, and OSA on CPAP at night, chronic hypoxemic respiratory failure presents worsening shortness of breath since several months.  Patient reports that she has worsening shortness of breath for the past several months however for the past 1 month her breathing is getting worse.  She cannot go from her bed to the bathroom without being short of breath.  She reports chest soreness due to prior history of rib fractures.  Denies sputum production, leg edema, orthopnea, PND, fever, chills.  She has occasional headaches and nausea and vomiting.  No lightheadedness, dizziness, blurry vision, presyncopal or syncopal episode.  Denies any recent sick contact or wheezing.  She has been compliant with all medications at home.  She is on 3 L of oxygen  via nasal cannula at home.  She spoke to her pulmonologist who recommended that she needs thoracentesis and was told to go to ER if worsening symptoms.  Lives with her husband.  Former smoker.  No history of alcohol , licit drug use  ED Course: upon arrival to ED: Patient afebrile, pulse 104, RR: 22, BP 155/67.  On 4 L of oxygen  via nasal cannula.  NA: 141, K: 3.8, BG: 255, BUN 41, CR: 3.16, GFR 14.  CBC shows leukocytosis of 12.3, H&H 10.0/33.4, PLT: 354.  Initial troponin 137.  BNP and repeat troponin pending.  Chest x-ray shows cardiomegaly with vascular congestion.   Moderate right and small left pleural effusion.  Airspace disease at the right middle lobe and right base may be due to atelectasis or pneumonia.  Patient was given Lasix  60 IV once in ER.  Prior hospitalist consulted for admission for CHF exacerbation.  Review of Systems: As per HPI otherwise negative.    Past Medical History:  Diagnosis Date   Allergy    Anxiety    Arthritis    CAD (coronary artery disease)    Mild per remote cath in 2006, /    Nuclear, January, 2013, no significant abnormality,   Carotid artery disease    Doppler, January, 2013, 0 39% RIC A., 40-59% LICA, stable   Cataract    CHF (congestive heart failure) (HCC)    Chronic kidney disease    dialysis Mon Friday   Complication of anesthesia    wakes up mean   COPD 02/16/2007   Intolerant of Spiriva , tudorza Intolerant of Trelegy    COPD (chronic obstructive pulmonary disease) (HCC)    Coronary atherosclerosis 11/24/2008   Catheterization 2006, nonobstructive coronary disease   //   nuclear 2013, low risk     Depression    Diabetes mellitus, type 2 (HCC)    Diverticula of colon    DIVERTICULOSIS OF COLON 03/23/2007   Qualifier: Diagnosis of  By: Shellia MD, Vineet     Dizziness    occasional   Dizzy spells 05/04/2011   DM (diabetes mellitus) (HCC) 03/23/2007   Qualifier: Diagnosis of  By: Shellia MD, Vineet     Dyspnea  On exertion   Ejection fraction    Essential hypertension 02/16/2007   Qualifier: Diagnosis of  By: Nicholaus CMA, Tammy     G E R D 03/23/2007   Qualifier: Diagnosis of  By: Shellia MD, Vineet     Gall bladder disease 04/28/2016   Gastritis and gastroduodenitis    GERD (gastroesophageal reflux disease)    Goiter    hx of   Headache(784.0)    migraine   Hyperlipidemia    Hypertension    Hypothyroidism    HYPOTHYROIDISM, POSTSURGICAL 06/04/2008   Qualifier: Diagnosis of  By: Kassie MD, Sean A    Left ventricular hypertrophy    Leg edema    occasional swelling   Leg swelling 12/09/2018    Myofascial pain syndrome, cervical 01/06/2018   Obesity    OBESITY 11/24/2008   Qualifier: Diagnosis of  By: Jaramillo, Luz     OBSTRUCTIVE SLEEP APNEA 02/16/2007   Auto CPAP 09/03/13 to 10/02/13 >> used on 30 of 30 nights with average 6 hrs and 3 min.  Average AHI is 3.1 with median CPAP 5 cm H2O and 95 th percentile CPAP 6 cm H2O.     Occipital neuralgia of right side 01/06/2018   OSA (obstructive sleep apnea)    uses cpap - setting of 12   Pain in joint of right hip 01/21/2018   Palpitations 01/21/2011   48 hour Holter, 2015, scattered PACs and PVCs.    Paroxysmal atrial fibrillation (HCC)    Pneumonia    PONV (postoperative nausea and vomiting)    severe after every surgery   Pulmonary hypertension, secondary    RHINITIS 07/21/2010        RUQ abdominal pain    S/P left TKA 07/27/2011   Sinus node dysfunction (HCC) 11/08/2015   Sleep apnea    Spinal stenosis of cervical region 11/07/2014   Urinary frequency 12/09/2018   UTI (urinary tract infection) 08/15/2019   per pt report   VENTRICULAR HYPERTROPHY, LEFT 11/24/2008   Qualifier: Diagnosis of  By: Justina Kos      Past Surgical History:  Procedure Laterality Date   A/V FISTULAGRAM N/A 01/26/2024   Procedure: A/V Fistulagram;  Surgeon: Serene Gaile ORN, MD;  Location: [facility];  Service: Cardiovascular;  Laterality: N/A;   ABDOMINAL HYSTERECTOMY     with appendectomy and colon polypectomy   AMPUTATION TOE Left 12/15/2019   Procedure: AMPUTATION  LEFT GREAT TOE;  Surgeon: Gretel Ozell PARAS, DPM;  Location: [facility];  Service: Podiatry;  Laterality: Left;   AMPUTATION TOE Left 01/22/2022   Procedure: AMPUTATION TOE 4th digit;  Surgeon: Tobie Franky SQUIBB, DPM;  Location: [facility];  Service: Podiatry;  Laterality: Left;   AMPUTATION TOE Left 09/04/2022   Procedure: LEFT BIG TOE AMPUTAION;  Surgeon: Gershon Donnice SAUNDERS, DPM;  Location: [facility];  Service: Podiatry;  Laterality: Left;   ANTERIOR CERVICAL DECOMP/DISCECTOMY FUSION N/A  11/07/2014   Procedure: Anterior Cervical diskectomy and fusion Cervical four-five, Cervical five-six, Cervical six-seven;  Surgeon: Arley Helling, MD;  Location: [facility];  Service: Neurosurgery;  Laterality: N/A;   APPENDECTOMY     ARTERY BIOPSY Right 02/16/2017   Procedure: BIOPSY TEMPORAL ARTERY;  Surgeon: Jama Cordella MATSU, MD;  Location: [facility];  Service: Vascular;  Laterality: Right;   ARTERY BIOPSY Left 04/16/2020   Procedure: BIOPSY TEMPORAL ARTERY;  Surgeon: Jama Cordella MATSU, MD;  Location: [facility];  Service: Vascular;  Laterality: Left;  LEFT TEMPLE   AV FISTULA  PLACEMENT Left 04/13/2023   Procedure: LEFT ARM ARTERIOVENOUS (AV) FISTULA CREATION;  Surgeon: Lanis Fonda BRAVO, MD;  Location[facility] OR;  Service: Vascular;  Laterality: Left;   BACK SURGERY     BASCILIC VEIN TRANSPOSITION Left 02/14/2024   Procedure: TRANSPOSITION,OF FISTULA;  Surgeon: Lanis Fonda BRAVO, MD;  Location: [facility] OR;  Service: Vascular;  Laterality: Left;   CARDIAC CATHETERIZATION     CATARACT EXTRACTION W/ INTRAOCULAR LENS  IMPLANT, BILATERAL Bilateral    CHOLECYSTECTOMY N/A 04/29/2016   Procedure: LAPAROSCOPIC CHOLECYSTECTOMY WITH INTRAOPERATIVE CHOLANGIOGRAM;  Surgeon: Alm Angle, MD;  Location: [facility];  Service: General;  Laterality: N/A;   EP IMPLANTABLE DEVICE N/A 11/08/2015   MRI COMPATABLE -- Procedure: Pacemaker Implant;  Surgeon: Elspeth JAYSON Sage, MD;  [facility]: [facility];  Service: Cardiovascular;  Laterality: N/A;   INCISION AND DRAINAGE ABSCESS Right 05/30/2021   Procedure: IRRIGATION AND DEBRIDEMENT OF PREPATELLA BURSA;  Surgeon: Ernie Cough, MD;  Location: [facility];  Service: Orthopedics;  Laterality: Right;  60 wants standard knee draping and pulse lavage   INCISION AND DRAINAGE ABSCESS Right 07/22/2021   Procedure: INCISION AND DRAINAGE ABSCESS RIGHT KNEE;  Surgeon: Ernie Cough, MD;  Location: [facility];  Service: Orthopedics;  Laterality: Right;   INSERT / REPLACE / REMOVE PACEMAKER  2017    Dr. Beverli [facility])   IR FLUORO GUIDE CV LINE RIGHT  07/25/2021   IR FLUORO GUIDE CV LINE RIGHT  11/04/2022   IR REMOVAL TUN CV CATH W/O FL  09/04/2021   IR US  GUIDE VASC ACCESS RIGHT  07/25/2021   IR US  GUIDE VASC ACCESS RIGHT  11/04/2022   JOINT REPLACEMENT     LEAD EXTRACTION N/A 09/08/2022   Procedure: LEAD EXTRACTION;  Surgeon: Waddell Danelle ORN, MD;  [facility];  Service: Cardiovascular;  Laterality: N/A;   LEAD EXTRACTION N/A 09/11/2022   Procedure: LEAD EXTRACTION;  Surgeon: Waddell Danelle ORN, MD;  [facility];  Service: Cardiovascular;  Laterality: N/A;   LIGATION OF COMPETING BRANCHES OF ARTERIOVENOUS FISTULA Left 02/14/2024   Procedure: LIGATION OF COMPETING BRANCHES OF ARTERIOVENOUS FISTULA;  Surgeon: Lanis Fonda BRAVO, MD;  Location: [facility] OR;  Service: Vascular;  Laterality: Left;   LUMBAR DISC SURGERY  1990's   x 2   REVISON OF ARTERIOVENOUS FISTULA Left 02/14/2024   Procedure: REVISON OF ARTERIOVENOUS FISTULA;  Surgeon: Lanis Fonda BRAVO, MD;  Location: [facility] OR;  Service: Vascular;  Laterality: Left;   TEE WITHOUT CARDIOVERSION N/A 09/07/2022   Procedure: TRANSESOPHAGEAL ECHOCARDIOGRAM;  Surgeon: Santo Stanly LABOR, MD;  [facility];  Service: Cardiovascular;  Laterality: N/A;   THYROIDECTOMY  2011   TOTAL KNEE ARTHROPLASTY  07/27/2011   Procedure: TOTAL KNEE ARTHROPLASTY;  Surgeon: Cough JONETTA Ernie, MD;  Location: [facility];  Service: Orthopedics;  Laterality: Left;   UPPER ESOPHAGEAL ENDOSCOPIC ULTRASOUND (EUS) N/A 05/14/2016   Procedure: UPPER ESOPHAGEAL ENDOSCOPIC ULTRASOUND (EUS);  Surgeon: Toribio SHAUNNA Cedar, MD;  Location: [facility];  Service: Endoscopy;  Laterality: N/A;  radial only-will be done mod if needed     reports that she has quit smoking. Her smoking use included cigarettes. She started smoking about 36 years ago. She has never used smokeless tobacco. She reports that she does not drink alcohol  and does not use  drugs.  Allergies[1]  Family History  Problem Relation Age of Onset   Heart Problems Mother    Heart defect Mother  valve problems   Heart Problems Father    Heart disease Brother    Lung cancer Daughter        non small cell    Diabetes Maternal Aunt    Diabetes Maternal Uncle    Throat cancer Brother    Colon cancer Neg Hx    Colon polyps Neg Hx    Esophageal cancer Neg Hx    Rectal cancer Neg Hx    Stomach cancer Neg Hx     Prior to Admission medications  Medication Sig Start Date End Date Taking? Authorizing Provider  albuterol  (PROVENTIL ) (2.5 MG/3ML) 0.083% nebulizer solution Take 3 mLs (2.5 mg total) by nebulization every 2 (two) hours as needed for wheezing or shortness of breath. 05/19/23   Neda Jennet LABOR, MD  albuterol  (VENTOLIN  HFA) 108 (90 Base) MCG/ACT inhaler Inhale 2 puffs into the lungs every 6 (six) hours as needed for wheezing or shortness of breath.    [provider]  amLODipine  (NORVASC ) 5 MG tablet Take 5 mg by mouth in the morning.    [provider]  ascorbic acid  (VITAMIN C ) 1000 MG tablet Take 1,000 mg by mouth daily with lunch.    [provider]  atorvastatin  (LIPITOR) 40 MG tablet TAKE 1 TABLET BY MOUTH DAILY 03/07/24   Jeffrie Oneil BROCKS, MD  budesonide -glycopyrrolate -formoterol  (BREZTRI  AEROSPHERE) 160-9-4.8 MCG/ACT AERO inhaler Inhale 2 puffs into the lungs in the morning and at bedtime. 01/20/24   Arrien, Mauricio Daniel, MD  Cholecalciferol  (VITAMIN D3) 25 MCG (1000 UT) CAPS Take 1,000 Units by mouth daily.    [provider]  cyanocobalamin  (VITAMIN B12) 1000 MCG tablet Take 1,000 mcg by mouth daily.    [provider]  docusate sodium  (COLACE) 100 MG capsule Take 1 capsule (100 mg total) by mouth 2 (two) times daily as needed for mild constipation or moderate constipation. 03/14/24   Amin, Ankit C, MD  ELIQUIS  2.5 MG TABS tablet TAKE 1 TABLET BY MOUTH TWICE  DAILY 01/21/24   Jeffrie Oneil BROCKS, MD   escitalopram  (LEXAPRO ) 10 MG tablet Take 10 mg by mouth daily.    [provider]  fluticasone  (FLONASE ) 50 MCG/ACT nasal spray Place 1 spray into both nostrils 2 (two) times daily as needed for allergies or rhinitis.    [provider]  HYDROcodone -acetaminophen  (NORCO/VICODIN) 5-325 MG tablet Take 1-2 tablets by mouth every 6 (six) hours as needed for moderate pain (pain score 4-6). 03/24/24   Drusilla Sabas RAMAN, MD  insulin  aspart (NOVOLOG  FLEXPEN) 100 UNIT/ML FlexPen Inject into the skin. 03/27/24   [provider]  insulin  lispro (HUMALOG  KWIKPEN) 100 UNIT/ML KwikPen Inject 0-9 Units into the skin: Sliding scale insulin  Less than 70 initiate hypoglycemia protocol;  70-120  0 units, 120-150 1 unit; 151-200 2 units; 201-250 3 units; 251-300 5 units;  301-350 7 units; 351-400 9 units. Greater than 400 call MD 03/24/24   Drusilla Sabas RAMAN, MD  isosorbide  mononitrate (IMDUR ) 60 MG 24 hr tablet Take 1 tablet (60 mg total) by mouth daily. 05/19/23   Jeffrie Oneil BROCKS, MD  LANTUS  SOLOSTAR 100 UNIT/ML Solostar Pen Inject 15 Units into the skin daily before breakfast. 03/24/24   Drusilla Sabas RAMAN, MD  levothyroxine  (SYNTHROID ) 200 MCG tablet Take 100-200 mcg by mouth See admin instructions. Take 1/2 tablet by mouth every Monday and Friday, then take 1 tablet all other days    [provider]  lidocaine  (LIDODERM ) 5 % Place 1 patch onto the  skin daily. Remove & Discard patch within 12 hours or as directed by MD 03/24/24   Drusilla Sabas RAMAN, MD  methocarbamol  (ROBAXIN ) 500 MG tablet Take 1 tablet (500 mg total) by mouth every 8 (eight) hours as needed for muscle spasms. 03/24/24   Drusilla Sabas RAMAN, MD  nitroGLYCERIN  (NITROSTAT ) 0.4 MG SL tablet Place 1 tablet (0.4 mg total) under the tongue every 5 (five) minutes as needed for chest pain. 05/19/23   Jeffrie Oneil BROCKS, MD  omeprazole  (PRILOSEC OTC) 20 MG tablet Take 20 mg by mouth daily.    [provider]  Polyethyl Glycol-Propyl Glycol  0.4-0.3 % SOLN Apply 1 drop to eye as needed.    [provider]  polyethylene glycol powder (GLYCOLAX /MIRALAX ) 17 GM/SCOOP powder Take 17 g by mouth 2 (two) times daily as needed for moderate constipation or severe constipation. Dissolve 1 capful (17g) in 4-8 ounces of liquid and take by mouth daily. 03/14/24   Amin, Ankit C, MD  sevelamer  carbonate (RENVELA ) 800 MG tablet Take 800 mg by mouth 3 (three) times daily with meals.    [provider]  torsemide  (DEMADEX ) 100 MG tablet Take 1 tablet (100 mg total) by mouth 2 (two) times daily. Patient taking differently: Take 100 mg by mouth See admin instructions. Take 1 tablet by mouth twice daily, except Monday and Friday 11/14/22   Lue Elsie BROCKS, MD    Physical Exam: Vitals:   04/27/24 1945 04/27/24 2003 04/27/24 2145  BP: (!) 155/67  (!) 156/77  Pulse: (!) 104  98  Resp: (!) 22  (!) 27  Temp: 98.1 F (36.7 C)    SpO2: 91%  100%  Weight:  83 kg   Height:  5' 5 (1.651 m)     Constitutional: NAD, calm, comfortable, on nasal cannula, spouse at the bedside. Eyes: PERRL, lids and conjunctivae normal ENMT: Mucous membranes are moist. Posterior pharynx clear of any exudate or lesions.Normal dentition.  Neck: normal, supple, no masses, no thyromegaly Respiratory: clear to auscultation bilaterally, no wheezing, no crackles. Normal respiratory effort. No accessory muscle use.  Cardiovascular: Systolic murmur noted.  No rubs or gallop.  No leg edema  abdomen: no tenderness, no masses palpated. No hepatosplenomegaly. Bowel sounds positive.  Musculoskeletal: no clubbing / cyanosis. No joint deformity upper and lower extremities. Good ROM, no contractures. Normal muscle tone.  Skin: no rashes, lesions, ulcers. No induration Neurologic: CN 2-12 grossly intact. Sensation intact, DTR normal. Strength 5/5 in all 4.  Psychiatric: Normal judgment and insight. Alert and oriented x 3. Normal mood.    Labs on Admission: I have  personally reviewed following labs and imaging studies  CBC: Recent Labs  Lab 04/27/24 2052  WBC 12.3*  HGB 10.0*  HCT 33.4*  MCV 101.2*  PLT 354   Basic Metabolic Panel: Recent Labs  Lab 04/27/24 2052  NA 141  K 3.8  CL 99  CO2 28  GLUCOSE 255*  BUN 41*  CREATININE 3.16*  CALCIUM  8.3*   GFR: Estimated Creatinine Clearance: 14.4 mL/min (A) (by C-G formula based on SCr of 3.16 mg/dL (H)). Liver Function Tests: No results for input(s): AST, ALT, ALKPHOS, BILITOT, PROT, ALBUMIN  in the last 168 hours. No results for input(s): LIPASE, AMYLASE in the last 168 hours. No results for input(s): AMMONIA in the last 168 hours. Coagulation Profile: No results for input(s): INR, PROTIME in the last 168 hours. Cardiac Enzymes: No results for input(s): CKTOTAL, CKMB, CKMBINDEX, TROPONINI in the last 168 hours.  BNP (last 3 results) No results for input(s): PROBNP in the last 8760 hours. HbA1C: No results for input(s): HGBA1C in the last 72 hours. CBG: No results for input(s): GLUCAP in the last 168 hours. Lipid Profile: No results for input(s): CHOL, HDL, LDLCALC, TRIG, CHOLHDL, LDLDIRECT in the last 72 hours. Thyroid  Function Tests: No results for input(s): TSH, T4TOTAL, FREET4, T3FREE, THYROIDAB in the last 72 hours. Anemia Panel: No results for input(s): VITAMINB12, FOLATE, FERRITIN, TIBC, IRON, RETICCTPCT in the last 72 hours. Urine analysis:    Component Value Date/Time   COLORURINE YELLOW 03/19/2024 1255   APPEARANCEUR CLEAR 03/19/2024 1255   LABSPEC 1.011 03/19/2024 1255   PHURINE 5.0 03/19/2024 1255   GLUCOSEU NEGATIVE 03/19/2024 1255   GLUCOSEU NEGATIVE 12/09/2018 0944   HGBUR NEGATIVE 03/19/2024 1255   BILIRUBINUR NEGATIVE 03/19/2024 1255   KETONESUR NEGATIVE 03/19/2024 1255   PROTEINUR >=300 (A) 03/19/2024 1255   UROBILINOGEN 0.2 12/09/2018 0944   NITRITE NEGATIVE 03/19/2024 1255    LEUKOCYTESUR NEGATIVE 03/19/2024 1255    Radiological Exams on Admission: DG Chest 2 View Result Date: 04/27/2024 CLINICAL DATA:  Shortness of breath cough EXAM: CHEST - 2 VIEW COMPARISON:  04/12/2024 FINDINGS: Right sided central venous catheter tip at the SVC. Cardiomegaly. Moderate right and small left pleural effusions. Vascular congestion. Airspace disease at the right middle lobe and right base. Hardware in the cervical spine. Numerous clips at the thoracic inlet IMPRESSION: Cardiomegaly with vascular congestion. Moderate right and small left pleural effusions. Airspace disease at the right middle lobe and right base may be due to atelectasis or pneumonia. Electronically Signed   By: Luke Bun M.D.   On: 04/27/2024 20:40    EKG: Independently reviewed.  Irregular rhythm.  PVCs. Assessment/Plan  Acute on chronic hypoxemic respiratory failure likely in the setting of acute on chronic diastolic CHF: - Patient presented with worsening shortness of breath.  Requiring 4 L of oxygen  via nasal cannula.  Baseline is 3 L.  Chest x-ray shows cardiomegaly and vascular congestion.SABRA  BNP is elevated. - Lasix  60 IV once given in ED. -Reviewed echo from 01/2024 that showed grade 2 diastolic dysfunction with EF of 55 to 60% and moderate to severe TR - Admit patient on the floor on telemetry.  Start Lasix  IV twice daily.  Monitor renal function.  Strict INO's and daily weight.  Monitor electrolytes.  Right-sided pleural effusion: - Will consult IR for thoracentesis. - On continuous pulse ox.  Elevated troponin: - Likely in the setting of CHF exacerbation.   - I discussed with on-call cardiology Dr. Otelia regarding elevated troponin of 137-140.  He recommended no need to start heparin  GTT at this time since her elevated troponin is likely due to demand ischemia in the setting of acute CHF  Hypertension: Resume home medication. - Amlodipine , Imdur .  Monitor BP closely  COPD Chronic hypoxemic  respiratory failure: -Patient is on 3 L of oxygen  via nasal cannula.  No wheezing noted on exam.  She is afebrile with leukocytosis (chronic).  Will continue home inhaler.  No indication of antibiotics at this time  ESRD on hemodialysis: On Mondays and Friday -Patient will be due for dialysis tomorrow.  Consult nephrology  Paroxysmal A-fib: -Will continue Eliquis .  Monitor on telemetry  Type 2 diabetes: Well-controlled.  Last A1c 6.2%.  Start sliding scale insulin .  Monitor blood sugar closely  Hypothyroidism: Check TSH.  Continue levothyroxine   Hyperlipidemia: Continue statin  Anemia of chronic disease: In the setting of ESRD.  H&H is stable.  Continue to monitor  Anxiety: Will resume home medications  Hypomagnesemia: Replenished.  Repeat magnesium  tomorrow a.m.  DVT prophylaxis: Eliquis  Code Status: DNR, prearrest intervention desired Family Communication: Patient's spouse present at bedside.  Plan of care discussed with patient in length and he verbalized understanding and agreed with it. Disposition Plan: To be determined Consults called:  Nephrology IR Admission status: Inpatient   Velna JONELLE Skeeter MD [facility]  If 7PM-7AM, please contact night-coverage [email or web address]  04/27/2024, 10:01 PM       [1]  Allergies Allergen Reactions   Tape Other (See Comments)    SKIN IS VERY THIN- TEARS EASILY!!   Oxycodone  Itching and Other (See Comments)    Pt still takes this med even thought it causes itching   Trelegy Ellipta  [Fluticasone -Umeclidin-Vilant] Nausea And Vomiting, Other (See Comments) and Cough    Made the patient sick to her stomach (only) several days after using it    Trelegy Ellipta  [Fluticasone -Umeclidin-Vilant] Nausea And Vomiting   Trulicity [Dulaglutide] Nausea And Vomiting, Nausea Only, Other (See Comments) and Cough    TRULICITY made the patient sick to her stomach (only) several days after using it   Trulicity [Dulaglutide] Nausea And Vomiting    "

## 2024-04-27 NOTE — TOC CM/SW Note (Signed)
 TOC consult received for d/c planning needs. Per previous notes, patient was recently discharged with [facility]. Message sent via Epic to confirm if patient is still receiving services. Awaiting response. Patient has home O2, 3L via  at baseline. Follow-up to be completed with patient as appropriate.   Merilee Batty, MSN, RN Case Management [phone]

## 2024-04-27 NOTE — ED Notes (Signed)
 Patient placed on 4L [place], up from baseline 3L.

## 2024-04-27 NOTE — ED Provider Notes (Signed)
 " [place] [facility] Provider Note   HPI/ROS    History obtained from patient and EMR.  Kayla [name] is a 83 y.o. female who presents for COPD / SOB and Chest Pain and who  has a past medical history of Allergy, Anxiety, Arthritis, CAD (coronary artery disease), Carotid artery disease, Cataract, CHF (congestive heart failure) (HCC), Chronic kidney disease, Complication of anesthesia, COPD (02/16/2007), COPD (chronic obstructive pulmonary disease) (HCC), Coronary atherosclerosis (11/24/2008), Depression, Diabetes mellitus, type 2 (HCC), Diverticula of colon, DIVERTICULOSIS OF COLON (03/23/2007), Dizziness, Dizzy spells (05/04/2011), DM (diabetes mellitus) (HCC) (03/23/2007), Dyspnea, Ejection fraction, Essential hypertension (02/16/2007), G E R D (03/23/2007), Gall bladder disease (04/28/2016), Gastritis and gastroduodenitis, GERD (gastroesophageal reflux disease), Goiter, Headache(784.0), Hyperlipidemia, Hypertension, Hypothyroidism, HYPOTHYROIDISM, POSTSURGICAL (06/04/2008), Left ventricular hypertrophy, Leg edema, Leg swelling (12/09/2018), Myofascial pain syndrome, cervical (01/06/2018), Obesity, OBESITY (11/24/2008), OBSTRUCTIVE SLEEP APNEA (02/16/2007), Occipital neuralgia of right side (01/06/2018), OSA (obstructive sleep apnea), Pain in joint of right hip (01/21/2018), Palpitations (01/21/2011), Paroxysmal atrial fibrillation (HCC), Pneumonia, PONV (postoperative nausea and vomiting), Pulmonary hypertension, secondary, RHINITIS (07/21/2010), RUQ abdominal pain, S/P left TKA (07/27/2011), Sinus node dysfunction (HCC) (11/08/2015), Sleep apnea, Spinal stenosis of cervical region (11/07/2014), Urinary frequency (12/09/2018), UTI (urinary tract infection) (08/15/2019), and VENTRICULAR HYPERTROPHY, LEFT (11/24/2008). Patient presents today for shortness of breath.  States she is on 3 L at home and has had progressive worsening shortness of breath over the last  several days.  Spoke with her pulmonologist who recommended that she get fluid drained off of her lungs and was told to the emergency department if it got worse.  States it is got worse over the last several days which prompted her for evaluation today.  Denies any fevers, chills, nausea, vomiting, or diarrhea.  Endorses some mild chest pain with deep breathing.  States she came here today to have fluid drained off her lungs.  MDM   I have reviewed the nursing documentation, vital signs, as well as the past medical history, surgical history, family history, and social history.  Initial Assessment:  Patient tachycardic and tachypneic on initial evaluation.  Satting well in the low 90s on 3 L.  Has COPD so this is appropriate for her.  The patient has concerns for worsening pleural effusion causing some of her shortness of breath will obtain chest x-ray as well as full labs including CBC, metabolic panel, BNP, and troponin.  Also add on COVID flu and EKG.    CBC with mild leukocytosis and chronic stable anemia.  BMP with no significant electrolyte abnormalities and stable baseline creatinine.  Troponin mildly elevated at 137, likely demand in the setting of worsening pleural effusions.  BNP elevated over 9000 as well.  COVID flu negative.  Chest x-ray here with moderate right pleural effusion and small left pleural effusion, agree with radiology.  EKG here A-fib with PVCs.  No obvious ischemia or high-grade AV block.  Spoke with medicine regarding admission for IR guided thoracentesis and they agree.  Patient admitted to the hospitalist service for proper management.   Disposition:  I discussed the case with Dr.Pahwani who graciously agreed to admit the patient to their service for continued care.     This patient was staffed with Dr. Dreama who supervised the visit and agreed with the plan of care.   Due to the patients current presenting symptoms, physical exam findings, and the workup  stated above, it is thought that the etiology of the patients current presentation is:  1. Paroxysmal atrial fibrillation (HCC)    Clinical Complexity A medically appropriate history, review of systems, and physical exam was performed.  Factors that affect the complexity of this encounter: assessment of correct protocol, laboratory work from this visit, notes from other physicians (pulmonology), and review of echocardiogram/EKG results  My independent interpretations of diagnostic studies are documented in the ED course above.   If decision rules were used in this patient's evaluation, they are listed below.   Click here for ABCD2, HEART and other calculatorsREFRESH Note before signing   Patient's presentation is most consistent with acute presentation with potential threat to life or bodily function.  MDM generated using voice dictation software and may contain dictation errors. Please contact me for any clarification or with any questions.    Physical Exam, PMH, PSH, Family History, and Social Hsitory   Vitals:   04/28/24 1103 04/28/24 1118 04/28/24 1119 04/28/24 1353  BP:  125/63  (!) 150/117  Pulse:   91 (!) 109  Resp:  (!) 27 (!) 30 (!) 23  Temp: 97.9 F (36.6 C)   98 F (36.7 C)  TempSrc:    Oral  SpO2:   93% 94%  Weight:      Height:        Physical Exam Constitutional:      Appearance: She is ill-appearing.  HENT:     Head: Normocephalic and atraumatic.  Cardiovascular:     Rate and Rhythm: Regular rhythm. Tachycardia present.  Pulmonary:     Effort: Tachypnea present.     Breath sounds: Decreased breath sounds and rhonchi present.  Abdominal:     Palpations: Abdomen is soft.     Tenderness: There is no abdominal tenderness. There is no guarding or rebound.  Musculoskeletal:     Right lower leg: Edema present.     Left lower leg: Edema present.  Skin:    General: Skin is warm and dry.     Capillary Refill: Capillary refill takes less than 2 seconds.   Neurological:     General: No focal deficit present.     Mental Status: She is oriented to person, place, and time.     Past Medical History:  Diagnosis Date   Allergy    Anxiety    Arthritis    CAD (coronary artery disease)    Mild per remote cath in 2006, /    Nuclear, January, 2013, no significant abnormality,   Carotid artery disease    Doppler, January, 2013, 0 39% RIC A., 40-59% LICA, stable   Cataract    CHF (congestive heart failure) (HCC)    Chronic kidney disease    dialysis Mon Friday   Complication of anesthesia    wakes up mean   COPD 02/16/2007   Intolerant of Spiriva , tudorza Intolerant of Trelegy    COPD (chronic obstructive pulmonary disease) (HCC)    Coronary atherosclerosis 11/24/2008   Catheterization 2006, nonobstructive coronary disease   //   nuclear 2013, low risk     Depression    Diabetes mellitus, type 2 (HCC)    Diverticula of colon    DIVERTICULOSIS OF COLON 03/23/2007   Qualifier: Diagnosis of  By: Shellia MD, Vineet     Dizziness    occasional   Dizzy spells 05/04/2011   DM (diabetes mellitus) (HCC) 03/23/2007   Qualifier: Diagnosis of  By: Shellia MD, Vineet     Dyspnea    On exertion   Ejection fraction    Essential hypertension  02/16/2007   Qualifier: Diagnosis of  By: Nicholaus CMA, Tammy     G E R D 03/23/2007   Qualifier: Diagnosis of  By: Shellia MD, Carolynne George bladder disease 04/28/2016   Gastritis and gastroduodenitis    GERD (gastroesophageal reflux disease)    Goiter    hx of   Headache(784.0)    migraine   Hyperlipidemia    Hypertension    Hypothyroidism    HYPOTHYROIDISM, POSTSURGICAL 06/04/2008   Qualifier: Diagnosis of  By: Kassie MD, Sean A    Left ventricular hypertrophy    Leg edema    occasional swelling   Leg swelling 12/09/2018   Myofascial pain syndrome, cervical 01/06/2018   Obesity    OBESITY 11/24/2008   Qualifier: Diagnosis of  By: Jaramillo, Luz     OBSTRUCTIVE SLEEP APNEA 02/16/2007   Auto CPAP  09/03/13 to 10/02/13 >> used on 30 of 30 nights with average 6 hrs and 3 min.  Average AHI is 3.1 with median CPAP 5 cm H2O and 95 th percentile CPAP 6 cm H2O.     Occipital neuralgia of right side 01/06/2018   OSA (obstructive sleep apnea)    uses cpap - setting of 12   Pain in joint of right hip 01/21/2018   Palpitations 01/21/2011   48 hour Holter, 2015, scattered PACs and PVCs.    Paroxysmal atrial fibrillation (HCC)    Pneumonia    PONV (postoperative nausea and vomiting)    severe after every surgery   Pulmonary hypertension, secondary    RHINITIS 07/21/2010        RUQ abdominal pain    S/P left TKA 07/27/2011   Sinus node dysfunction (HCC) 11/08/2015   Sleep apnea    Spinal stenosis of cervical region 11/07/2014   Urinary frequency 12/09/2018   UTI (urinary tract infection) 08/15/2019   per pt report   VENTRICULAR HYPERTROPHY, LEFT 11/24/2008   Qualifier: Diagnosis of  By: Justina Kos       Past Surgical History:  Procedure Laterality Date   A/V FISTULAGRAM N/A 01/26/2024   Procedure: A/V Fistulagram;  Surgeon: Serene Gaile ORN, MD;  Location: [facility];  Service: Cardiovascular;  Laterality: N/A;   ABDOMINAL HYSTERECTOMY     with appendectomy and colon polypectomy   AMPUTATION TOE Left 12/15/2019   Procedure: AMPUTATION  LEFT GREAT TOE;  Surgeon: Gretel Ozell PARAS, DPM;  Location: [facility];  Service: Podiatry;  Laterality: Left;   AMPUTATION TOE Left 01/22/2022   Procedure: AMPUTATION TOE 4th digit;  Surgeon: Tobie Franky SQUIBB, DPM;  Location: [facility];  Service: Podiatry;  Laterality: Left;   AMPUTATION TOE Left 09/04/2022   Procedure: LEFT BIG TOE AMPUTAION;  Surgeon: Gershon Donnice SAUNDERS, DPM;  Location: [facility];  Service: Podiatry;  Laterality: Left;   ANTERIOR CERVICAL DECOMP/DISCECTOMY FUSION N/A 11/07/2014   Procedure: Anterior Cervical diskectomy and fusion Cervical four-five, Cervical five-six, Cervical six-seven;  Surgeon: Arley Helling, MD;  Location: [facility];  Service:  Neurosurgery;  Laterality: N/A;   APPENDECTOMY     ARTERY BIOPSY Right 02/16/2017   Procedure: BIOPSY TEMPORAL ARTERY;  Surgeon: Jama Cordella MATSU, MD;  Location: [facility];  Service: Vascular;  Laterality: Right;   ARTERY BIOPSY Left 04/16/2020   Procedure: BIOPSY TEMPORAL ARTERY;  Surgeon: Jama Cordella MATSU, MD;  Location: [facility];  Service: Vascular;  Laterality: Left;  LEFT TEMPLE   AV FISTULA PLACEMENT Left 04/13/2023   Procedure: LEFT ARM ARTERIOVENOUS (AV)  FISTULA CREATION;  Surgeon: Lanis Fonda BRAVO, MD;  Location: [facility] OR;  Service: Vascular;  Laterality: Left;   BACK SURGERY     BASCILIC VEIN TRANSPOSITION Left 02/14/2024   Procedure: TRANSPOSITION,OF FISTULA;  Surgeon: Lanis Fonda BRAVO, MD;  Location: [facility];  Service: Vascular;  Laterality: Left;   CARDIAC CATHETERIZATION     CATARACT EXTRACTION W/ INTRAOCULAR LENS  IMPLANT, BILATERAL Bilateral    CHOLECYSTECTOMY N/A 04/29/2016   Procedure: LAPAROSCOPIC CHOLECYSTECTOMY WITH INTRAOPERATIVE CHOLANGIOGRAM;  Surgeon: Alm Angle, MD;  Location: [facility];  Service: General;  Laterality: N/A;   EP IMPLANTABLE DEVICE N/A 11/08/2015   MRI COMPATABLE -- Procedure: Pacemaker Implant;  Surgeon: Elspeth JAYSON Sage, MD;  Location: [facility];  Service: Cardiovascular;  Laterality: N/A;   INCISION AND DRAINAGE ABSCESS Right 05/30/2021   Procedure: IRRIGATION AND DEBRIDEMENT OF PREPATELLA BURSA;  Surgeon: Ernie Cough, MD;  Location: [facility];  Service: Orthopedics;  Laterality: Right;  60 wants standard knee draping and pulse lavage   INCISION AND DRAINAGE ABSCESS Right 07/22/2021   Procedure: INCISION AND DRAINAGE ABSCESS RIGHT KNEE;  Surgeon: Ernie Cough, MD;  Location: [facility];  Service: Orthopedics;  Laterality: Right;   INSERT / REPLACE / REMOVE PACEMAKER  2017   Dr. Beverli [facility])   IR FLUORO GUIDE CV LINE RIGHT  07/25/2021   IR FLUORO GUIDE CV LINE RIGHT  11/04/2022   IR REMOVAL TUN CV CATH W/O FL  09/04/2021   IR US  GUIDE VASC ACCESS  RIGHT  07/25/2021   IR US  GUIDE VASC ACCESS RIGHT  11/04/2022   JOINT REPLACEMENT     LEAD EXTRACTION N/A 09/08/2022   Procedure: LEAD EXTRACTION;  Surgeon: Waddell Danelle ORN, MD;  Location: [facility];  Service: Cardiovascular;  Laterality: N/A;   LEAD EXTRACTION N/A 09/11/2022   Procedure: LEAD EXTRACTION;  Surgeon: Waddell Danelle ORN, MD;  Location: [facility];  Service: Cardiovascular;  Laterality: N/A;   LIGATION OF COMPETING BRANCHES OF ARTERIOVENOUS FISTULA Left 02/14/2024   Procedure: LIGATION OF COMPETING BRANCHES OF ARTERIOVENOUS FISTULA;  Surgeon: Lanis Fonda BRAVO, MD;  Location: [facility] OR;  Service: Vascular;  Laterality: Left;   LUMBAR DISC SURGERY  1990's   x 2   REVISON OF ARTERIOVENOUS FISTULA Left 02/14/2024   Procedure: REVISON OF ARTERIOVENOUS FISTULA;  Surgeon: Lanis Fonda BRAVO, MD;  Location: [facility] OR;  Service: Vascular;  Laterality: Left;   TEE WITHOUT CARDIOVERSION N/A 09/07/2022   Procedure: TRANSESOPHAGEAL ECHOCARDIOGRAM;  Surgeon: Santo Stanly LABOR, MD;  Location: [facility];  Service: Cardiovascular;  Laterality: N/A;   THYROIDECTOMY  2011   TOTAL KNEE ARTHROPLASTY  07/27/2011   Procedure: TOTAL KNEE ARTHROPLASTY;  Surgeon: Cough JONETTA Ernie, MD;  Location: [facility];  Service: Orthopedics;  Laterality: Left;   UPPER ESOPHAGEAL ENDOSCOPIC ULTRASOUND (EUS) N/A 05/14/2016   Procedure: UPPER ESOPHAGEAL ENDOSCOPIC ULTRASOUND (EUS);  Surgeon: Toribio SHAUNNA Cedar, MD;  Location: [facility];  Service: Endoscopy;  Laterality: N/A;  radial only-will be done mod if needed     Family History  Problem Relation Age of Onset   Heart Problems Mother    Heart defect Mother        valve problems   Heart Problems Father    Heart disease Brother    Lung cancer Daughter        non small cell    Diabetes Maternal Aunt    Diabetes Maternal Uncle    Throat cancer Brother    Colon  cancer Neg Hx    Colon polyps Neg Hx    Esophageal cancer Neg Hx    Rectal cancer Neg Hx     Stomach cancer Neg Hx     Social History   Tobacco Use   Smoking status: Former    Types: Cigarettes    Start date: 1990   Smokeless tobacco: Never  Substance Use Topics   Alcohol  use: No     Procedures   If procedures were preformed on this patient, they are listed below:  Procedures   Electronically signed by:   Glendia Carlin Ancona, M.D. PGY-2, Emergency Medicine   Please note that this documentation was produced with the assistance of voice-to-text technology and may contain errors.    Ancona Glendia, MD 04/28/24 1419    Dreama Longs, MD 04/29/24 1204  "

## 2024-04-28 ENCOUNTER — Inpatient Hospital Stay (HOSPITAL_COMMUNITY)

## 2024-04-28 DIAGNOSIS — I5033 Acute on chronic diastolic (congestive) heart failure: Secondary | ICD-10-CM

## 2024-04-28 HISTORY — PX: IR THORACENTESIS ASP PLEURAL SPACE W/IMG GUIDE: IMG5380

## 2024-04-28 LAB — CBC
HCT: 32.7 % — ABNORMAL LOW (ref 36.0–46.0)
Hemoglobin: 9.6 g/dL — ABNORMAL LOW (ref 12.0–15.0)
MCH: 29.6 pg (ref 26.0–34.0)
MCHC: 29.4 g/dL — ABNORMAL LOW (ref 30.0–36.0)
MCV: 100.9 fL — ABNORMAL HIGH (ref 80.0–100.0)
Platelets: 312 K/uL (ref 150–400)
RBC: 3.24 MIL/uL — ABNORMAL LOW (ref 3.87–5.11)
RDW: 15.5 % (ref 11.5–15.5)
WBC: 11.9 K/uL — ABNORMAL HIGH (ref 4.0–10.5)
nRBC: 0 % (ref 0.0–0.2)

## 2024-04-28 LAB — CBG MONITORING, ED
Glucose-Capillary: 187 mg/dL — ABNORMAL HIGH (ref 70–99)
Glucose-Capillary: 147 mg/dL — ABNORMAL HIGH (ref 70–99)
Glucose-Capillary: 141 mg/dL — ABNORMAL HIGH (ref 70–99)
Glucose-Capillary: 148 mg/dL — ABNORMAL HIGH (ref 70–99)

## 2024-04-28 LAB — GRAM STAIN

## 2024-04-28 LAB — BASIC METABOLIC PANEL WITH GFR
Anion gap: 12 (ref 5–15)
BUN: 41 mg/dL — ABNORMAL HIGH (ref 8–23)
CO2: 28 mmol/L (ref 22–32)
Calcium: 7.7 mg/dL — ABNORMAL LOW (ref 8.9–10.3)
Chloride: 101 mmol/L (ref 98–111)
Creatinine, Ser: 3.14 mg/dL — ABNORMAL HIGH (ref 0.44–1.00)
GFR, Estimated: 14 mL/min — ABNORMAL LOW
Glucose, Bld: 156 mg/dL — ABNORMAL HIGH (ref 70–99)
Potassium: 3.6 mmol/L (ref 3.5–5.1)
Sodium: 141 mmol/L (ref 135–145)

## 2024-04-28 LAB — HEPATITIS B SURFACE ANTIGEN: Hepatitis B Surface Ag: NONREACTIVE

## 2024-04-28 LAB — LACTATE DEHYDROGENASE: LDH: 225 U/L (ref 105–235)

## 2024-04-28 LAB — MAGNESIUM: Magnesium: 3.3 mg/dL — ABNORMAL HIGH (ref 1.7–2.4)

## 2024-04-28 LAB — GLUCOSE, CAPILLARY: Glucose-Capillary: 147 mg/dL — ABNORMAL HIGH (ref 70–99)

## 2024-04-28 MED ORDER — LIDOCAINE-PRILOCAINE 2.5-2.5 % EX CREA: 1.0000 | TOPICAL_CREAM | CUTANEOUS | Status: DC | PRN

## 2024-04-28 MED ORDER — HEPARIN SODIUM (PORCINE) 1000 UNIT/ML IJ SOLN
INTRAMUSCULAR | Status: AC
  Filled 2024-04-28: qty 4

## 2024-04-28 MED ORDER — DARBEPOETIN ALFA 100 MCG/0.5ML IJ SOSY
100.0000 ug | PREFILLED_SYRINGE | Freq: Once | INTRAMUSCULAR | Status: DC
  Filled 2024-04-28: qty 0.5

## 2024-04-28 MED ORDER — HEPARIN SODIUM (PORCINE) 1000 UNIT/ML IJ SOLN
INTRAMUSCULAR | Status: AC
  Filled 2024-04-28: qty 3

## 2024-04-28 MED ORDER — ALTEPLASE 2 MG IJ SOLR: 2.0000 mg | Freq: Once | INTRAMUSCULAR | Status: DC | PRN

## 2024-04-28 MED ORDER — PENTAFLUOROPROP-TETRAFLUOROETH EX AERO: 1.0000 | INHALATION_SPRAY | CUTANEOUS | Status: DC | PRN

## 2024-04-28 MED ORDER — LIDOCAINE-EPINEPHRINE 1 %-1:100000 IJ SOLN
INTRAMUSCULAR | Status: AC
  Filled 2024-04-28: qty 20

## 2024-04-28 MED ORDER — HEPARIN SODIUM (PORCINE) 1000 UNIT/ML DIALYSIS
3000.0000 [IU] | INTRAMUSCULAR | Status: DC | PRN
  Administered 2024-04-28: 3000 [IU] via INTRAVENOUS_CENTRAL
  Filled 2024-04-28: qty 3

## 2024-04-28 MED ORDER — ALPRAZOLAM 0.25 MG PO TABS
0.2500 mg | ORAL_TABLET | Freq: Two times a day (BID) | ORAL | Status: DC | PRN
  Administered 2024-04-28 – 2024-05-01 (×2): 0.25 mg via ORAL
  Filled 2024-04-28: qty 1

## 2024-04-28 MED ORDER — HYDROCODONE-ACETAMINOPHEN 5-325 MG PO TABS: 1.0000 | ORAL_TABLET | Freq: Four times a day (QID) | ORAL | Status: DC | PRN

## 2024-04-28 MED ORDER — LIDOCAINE HCL (PF) 1 % IJ SOLN: 5.0000 mL | INTRAMUSCULAR | Status: DC | PRN

## 2024-04-28 MED ORDER — ANTICOAGULANT SODIUM CITRATE 4% (200MG/5ML) IV SOLN: 5.0000 mL | Status: DC | PRN

## 2024-04-28 MED ORDER — CHLORHEXIDINE GLUCONATE CLOTH 2 % EX PADS
6.0000 | MEDICATED_PAD | Freq: Every day | CUTANEOUS | Status: DC
  Administered 2024-04-29 – 2024-05-02 (×4): 6 via TOPICAL

## 2024-04-28 MED ORDER — LIDOCAINE-EPINEPHRINE 1 %-1:100000 IJ SOLN
20.0000 mL | Freq: Once | INTRAMUSCULAR | Status: AC
  Administered 2024-04-28: 10 mL via INTRADERMAL

## 2024-04-28 MED ORDER — HEPARIN SODIUM (PORCINE) 1000 UNIT/ML DIALYSIS: 1000.0000 [IU] | INTRAMUSCULAR | Status: DC | PRN

## 2024-04-28 MED ORDER — FUROSEMIDE 10 MG/ML IJ SOLN
40.0000 mg | Freq: Two times a day (BID) | INTRAMUSCULAR | Status: DC
  Administered 2024-04-28 – 2024-04-29 (×2): 40 mg via INTRAVENOUS
  Filled 2024-04-28 (×2): qty 4

## 2024-04-28 MED ORDER — HEPARIN SODIUM (PORCINE) 1000 UNIT/ML DIALYSIS
1000.0000 [IU] | INTRAMUSCULAR | Status: DC | PRN
  Administered 2024-04-28: 3200 [IU]
  Filled 2024-04-28: qty 1

## 2024-04-28 MED ORDER — HYDROCODONE-ACETAMINOPHEN 5-325 MG PO TABS
ORAL_TABLET | ORAL | Status: AC
  Filled 2024-04-28: qty 1

## 2024-04-28 MED ORDER — HYDROCODONE-ACETAMINOPHEN 5-325 MG PO TABS
1.0000 | ORAL_TABLET | ORAL | Status: DC | PRN
  Administered 2024-04-28 – 2024-05-03 (×5): 1 via ORAL
  Filled 2024-04-28 (×3): qty 1

## 2024-04-28 NOTE — Consult Note (Signed)
 [facility] Renal Consultation Note    Indication for Consultation:  Management of ESRD/hemodialysis; anemia, hypertension/volume and secondary hyperparathyroidism   HPI: Kayla [name] is a 83 y.o. female with ESRD on HD, hypertension, hyperlipidemia, CAD, CHF, paroxysmal atrial fibrillation on Eliquis , SSS s/p PM, carotid artery disease, COPD, diabetes mellitus type 2 with polyneuropathy, anemia of chronic disease, hypothyroidism, and OSA on CPAP at night, chronic respiratory failure who is admitted with worsening shortness of breath.   Initial CXR notable for vascular congestion and moderate right and small left pleural effusions. Underwent thoracentesis with IR this am 1.2L removed.   Labs Na 141, K 3.6, BUN 41, WBC 11.9 Hgb 9.6. Pro BNP 9246. Nephrology consult for dialysis. Orders placed for dialysis today.  Seen and examined in the ED. States breathing has improved some after thoracentesis. Denies cp, sob, nausea/vomiting.   Usual dialysis Mon/Fri. Last HD 12/21 due to holiday schedule. She completed a full treatment and left at her dry weight. Has just started using her AVF. She reports infiltration after the first use. Discussed that she may benefit from 3x/week dialysis.   Past Medical History:  Diagnosis Date   Allergy    Anxiety    Arthritis    CAD (coronary artery disease)    Mild per remote cath in 2006, /    Nuclear, January, 2013, no significant abnormality,   Carotid artery disease    Doppler, January, 2013, 0 39% RIC A., 40-59% LICA, stable   Cataract    CHF (congestive heart failure) (HCC)    Chronic kidney disease    dialysis Mon Friday   Complication of anesthesia    wakes up mean   COPD 02/16/2007   Intolerant of Spiriva , tudorza Intolerant of Trelegy    COPD (chronic obstructive pulmonary disease) (HCC)    Coronary atherosclerosis 11/24/2008   Catheterization 2006, nonobstructive coronary disease   //   nuclear 2013, low risk      Depression    Diabetes mellitus, type 2 (HCC)    Diverticula of colon    DIVERTICULOSIS OF COLON 03/23/2007   Qualifier: Diagnosis of  By: Shellia MD, Vineet     Dizziness    occasional   Dizzy spells 05/04/2011   DM (diabetes mellitus) (HCC) 03/23/2007   Qualifier: Diagnosis of  By: Shellia MD, Vineet     Dyspnea    On exertion   Ejection fraction    Essential hypertension 02/16/2007   Qualifier: Diagnosis of  By: Nicholaus CMA, Tammy     G E R D 03/23/2007   Qualifier: Diagnosis of  By: Shellia MD, Vineet     Gall bladder disease 04/28/2016   Gastritis and gastroduodenitis    GERD (gastroesophageal reflux disease)    Goiter    hx of   Headache(784.0)    migraine   Hyperlipidemia    Hypertension    Hypothyroidism    HYPOTHYROIDISM, POSTSURGICAL 06/04/2008   Qualifier: Diagnosis of  By: Kassie MD, Sean A    Left ventricular hypertrophy    Leg edema    occasional swelling   Leg swelling 12/09/2018   Myofascial pain syndrome, cervical 01/06/2018   Obesity    OBESITY 11/24/2008   Qualifier: Diagnosis of  By: Jaramillo, Luz     OBSTRUCTIVE SLEEP APNEA 02/16/2007   Auto CPAP 09/03/13 to 10/02/13 >> used on 30 of 30 nights with average 6 hrs and 3 min.  Average AHI is 3.1 with median CPAP 5 cm H2O and 95  th percentile CPAP 6 cm H2O.     Occipital neuralgia of right side 01/06/2018   OSA (obstructive sleep apnea)    uses cpap - setting of 12   Pain in joint of right hip 01/21/2018   Palpitations 01/21/2011   48 hour Holter, 2015, scattered PACs and PVCs.    Paroxysmal atrial fibrillation (HCC)    Pneumonia    PONV (postoperative nausea and vomiting)    severe after every surgery   Pulmonary hypertension, secondary    RHINITIS 07/21/2010        RUQ abdominal pain    S/P left TKA 07/27/2011   Sinus node dysfunction (HCC) 11/08/2015   Sleep apnea    Spinal stenosis of cervical region 11/07/2014   Urinary frequency 12/09/2018   UTI (urinary tract infection) 08/15/2019   per pt  report   VENTRICULAR HYPERTROPHY, LEFT 11/24/2008   Qualifier: Diagnosis of  By: Justina Kos     Past Surgical History:  Procedure Laterality Date   A/V FISTULAGRAM N/A 01/26/2024   Procedure: A/V Fistulagram;  Surgeon: Serene Gaile ORN, MD;  Location: [facility];  Service: Cardiovascular;  Laterality: N/A;   ABDOMINAL HYSTERECTOMY     with appendectomy and colon polypectomy   AMPUTATION TOE Left 12/15/2019   Procedure: AMPUTATION  LEFT GREAT TOE;  Surgeon: Gretel Ozell PARAS, DPM;  Location: [facility];  Service: Podiatry;  Laterality: Left;   AMPUTATION TOE Left 01/22/2022   Procedure: AMPUTATION TOE 4th digit;  Surgeon: Tobie Franky SQUIBB, DPM;  Location: [facility];  Service: Podiatry;  Laterality: Left;   AMPUTATION TOE Left 09/04/2022   Procedure: LEFT BIG TOE AMPUTAION;  Surgeon: Gershon Donnice SAUNDERS, DPM;  Location: [facility];  Service: Podiatry;  Laterality: Left;   ANTERIOR CERVICAL DECOMP/DISCECTOMY FUSION N/A 11/07/2014   Procedure: Anterior Cervical diskectomy and fusion Cervical four-five, Cervical five-six, Cervical six-seven;  Surgeon: Arley Helling, MD;  Location: [facility];  Service: Neurosurgery;  Laterality: N/A;   APPENDECTOMY     ARTERY BIOPSY Right 02/16/2017   Procedure: BIOPSY TEMPORAL ARTERY;  Surgeon: Jama Cordella MATSU, MD;  Location: [facility];  Service: Vascular;  Laterality: Right;   ARTERY BIOPSY Left 04/16/2020   Procedure: BIOPSY TEMPORAL ARTERY;  Surgeon: Jama Cordella MATSU, MD;  Location: [facility];  Service: Vascular;  Laterality: Left;  LEFT TEMPLE   AV FISTULA PLACEMENT Left 04/13/2023   Procedure: LEFT ARM ARTERIOVENOUS (AV) FISTULA CREATION;  Surgeon: Lanis Fonda BRAVO, MD;  Location: [facility] OR;  Service: Vascular;  Laterality: Left;   BACK SURGERY     BASCILIC VEIN TRANSPOSITION Left 02/14/2024   Procedure: TRANSPOSITION,OF FISTULA;  Surgeon: Lanis Fonda BRAVO, MD;  Location[facility] OR;  Service: Vascular;  Laterality: Left;   CARDIAC CATHETERIZATION     CATARACT EXTRACTION W/  INTRAOCULAR LENS  IMPLANT, BILATERAL Bilateral    CHOLECYSTECTOMY N/A 04/29/2016   Procedure: LAPAROSCOPIC CHOLECYSTECTOMY WITH INTRAOPERATIVE CHOLANGIOGRAM;  Surgeon: Alm Angle, MD;  Location: [facility];  Service: General;  Laterality: N/A;   EP IMPLANTABLE DEVICE N/A 11/08/2015   MRI COMPATABLE -- Procedure: Pacemaker Implant;  Surgeon: Elspeth JAYSON Sage, MD;  [facility]: [facility];  Service: Cardiovascular;  Laterality: N/A;   INCISION AND DRAINAGE ABSCESS Right 05/30/2021   Procedure: IRRIGATION AND DEBRIDEMENT OF PREPATELLA BURSA;  Surgeon: Ernie Donnice, MD;  Location: [facility];  Service: Orthopedics;  Laterality: Right;  60 wants standard knee draping and pulse lavage   INCISION AND DRAINAGE ABSCESS Right 07/22/2021   Procedure:  INCISION AND DRAINAGE ABSCESS RIGHT KNEE;  Surgeon: Ernie Cough, MD;  Location: [facility];  Service: Orthopedics;  Laterality: Right;   INSERT / REPLACE / REMOVE PACEMAKER  2017   Dr. Beverli [facility])   IR FLUORO GUIDE CV LINE RIGHT  07/25/2021   IR FLUORO GUIDE CV LINE RIGHT  11/04/2022   IR REMOVAL TUN CV CATH W/O FL  09/04/2021   IR US  GUIDE VASC ACCESS RIGHT  07/25/2021   IR US  GUIDE VASC ACCESS RIGHT  11/04/2022   JOINT REPLACEMENT     LEAD EXTRACTION N/A 09/08/2022   Procedure: LEAD EXTRACTION;  Surgeon: Waddell Danelle ORN, MD;  [facility];  Service: Cardiovascular;  Laterality: N/A;   LEAD EXTRACTION N/A 09/11/2022   Procedure: LEAD EXTRACTION;  Surgeon: Waddell Danelle ORN, MD;  [facility];  Service: Cardiovascular;  Laterality: N/A;   LIGATION OF COMPETING BRANCHES OF ARTERIOVENOUS FISTULA Left 02/14/2024   Procedure: LIGATION OF COMPETING BRANCHES OF ARTERIOVENOUS FISTULA;  Surgeon: Lanis Fonda BRAVO, MD;  Location: [facility] OR;  Service: Vascular;  Laterality: Left;   LUMBAR DISC SURGERY  1990's   x 2   REVISON OF ARTERIOVENOUS FISTULA Left 02/14/2024   Procedure: REVISON OF ARTERIOVENOUS FISTULA;  Surgeon: Lanis Fonda BRAVO, MD;   Location: [facility] OR;  Service: Vascular;  Laterality: Left;   TEE WITHOUT CARDIOVERSION N/A 09/07/2022   Procedure: TRANSESOPHAGEAL ECHOCARDIOGRAM;  Surgeon: Santo Stanly LABOR, MD;  [facility];  Service: Cardiovascular;  Laterality: N/A;   THYROIDECTOMY  2011   TOTAL KNEE ARTHROPLASTY  07/27/2011   Procedure: TOTAL KNEE ARTHROPLASTY;  Surgeon: Cough JONETTA Ernie, MD;  Location: [facility];  Service: Orthopedics;  Laterality: Left;   UPPER ESOPHAGEAL ENDOSCOPIC ULTRASOUND (EUS) N/A 05/14/2016   Procedure: UPPER ESOPHAGEAL ENDOSCOPIC ULTRASOUND (EUS);  Surgeon: Toribio SHAUNNA Cedar, MD;  Location: [facility];  Service: Endoscopy;  Laterality: N/A;  radial only-will be done mod if needed   Family History  Problem Relation Age of Onset   Heart Problems Mother    Heart defect Mother        valve problems   Heart Problems Father    Heart disease Brother    Lung cancer Daughter        non small cell    Diabetes Maternal Aunt    Diabetes Maternal Uncle    Throat cancer Brother    Colon cancer Neg Hx    Colon polyps Neg Hx    Esophageal cancer Neg Hx    Rectal cancer Neg Hx    Stomach cancer Neg Hx    Social History:  reports that she has quit smoking. Her smoking use included cigarettes. She started smoking about 36 years ago. She has never used smokeless tobacco. She reports that she does not drink alcohol  and does not use drugs. Allergies[1] Prior to Admission medications  Medication Sig Start Date End Date Taking? Authorizing Provider  albuterol  (PROVENTIL ) (2.5 MG/3ML) 0.083% nebulizer solution Take 3 mLs (2.5 mg total) by nebulization every 2 (two) hours as needed for wheezing or shortness of breath. 05/19/23  Yes Olalere, Adewale A, MD  albuterol  (VENTOLIN  HFA) 108 (90 Base) MCG/ACT inhaler Inhale 2 puffs into the lungs every 6 (six) hours as needed for wheezing or shortness of breath.   Yes [provider]  amLODipine  (NORVASC ) 5 MG tablet Take 5 mg by mouth in the  morning.   Yes [provider]  apixaban  (ELIQUIS ) 2.5 MG TABS tablet  Take 2.5 mg by mouth 2 (two) times daily.   Yes [provider]  ascorbic acid  (VITAMIN C ) 1000 MG tablet Take 1,000 mg by mouth daily with lunch.   Yes [provider]  atorvastatin  (LIPITOR) 40 MG tablet TAKE 1 TABLET BY MOUTH DAILY 03/07/24  Yes Jeffrie Oneil BROCKS, MD  budesonide -glycopyrrolate -formoterol  (BREZTRI  AEROSPHERE) 160-9-4.8 MCG/ACT AERO inhaler Inhale 2 puffs into the lungs in the morning and at bedtime. 01/20/24  Yes Arrien, Elidia Sieving, MD  Cholecalciferol  (VITAMIN D3) 25 MCG (1000 UT) CAPS Take 1,000 Units by mouth daily.   Yes [provider]  cyanocobalamin  (VITAMIN B12) 1000 MCG tablet Take 1,000 mcg by mouth daily.   Yes [provider]  docusate sodium  (COLACE) 100 MG capsule Take 1 capsule (100 mg total) by mouth 2 (two) times daily as needed for mild constipation or moderate constipation. 03/14/24  Yes Amin, Ankit C, MD  escitalopram  (LEXAPRO ) 10 MG tablet Take 10 mg by mouth daily.   Yes [provider]  fluticasone  (FLONASE ) 50 MCG/ACT nasal spray Place 1 spray into both nostrils 2 (two) times daily as needed for allergies or rhinitis.   Yes [provider]  HYDROcodone -acetaminophen  (NORCO) 7.5-325 MG tablet Take 1 tablet by mouth every 6 (six) hours as needed for moderate pain (pain score 4-6). 04/20/24  Yes [provider]  insulin  lispro (HUMALOG  KWIKPEN) 100 UNIT/ML KwikPen Inject 0-9 Units into the skin: Sliding scale insulin  Less than 70 initiate hypoglycemia protocol;  70-120  0 units, 120-150 1 unit; 151-200 2 units; 201-250 3 units; 251-300 5 units;  301-350 7 units; 351-400 9 units. Greater than 400 call MD 03/24/24  Yes Drusilla Sabas RAMAN, MD  isosorbide  mononitrate (IMDUR ) 60 MG 24 hr tablet Take 1 tablet (60 mg total) by mouth daily. 05/19/23  Yes Jeffrie Oneil BROCKS, MD  LANTUS  SOLOSTAR 100 UNIT/ML Solostar Pen Inject 15 Units into  the skin daily before breakfast. 03/24/24  Yes Drusilla, Sabas RAMAN, MD  levothyroxine  (SYNTHROID ) 200 MCG tablet Take 100-200 mcg by mouth See admin instructions. Take 1/2 tablet by mouth every Monday and Friday, then take 1 tablet all other days   Yes [provider]  nitroGLYCERIN  (NITROSTAT ) 0.4 MG SL tablet Place 1 tablet (0.4 mg total) under the tongue every 5 (five) minutes as needed for chest pain. 05/19/23  Yes Jeffrie Oneil BROCKS, MD  omeprazole  (PRILOSEC OTC) 20 MG tablet Take 20 mg by mouth daily at 2 PM.   Yes [provider]  OXYGEN  Inhale 3 L into the lungs continuous.   Yes [provider]  Polyethyl Glycol-Propyl Glycol 0.4-0.3 % SOLN Place 1 drop into both eyes as needed (dry eyes, eye irritations.).   Yes [provider]  sevelamer  carbonate (RENVELA ) 800 MG tablet Take 800 mg by mouth 3 (three) times daily with meals.   Yes [provider]  torsemide  (DEMADEX ) 100 MG tablet Take 1 tablet (100 mg total) by mouth 2 (two) times daily. Patient taking differently: Take 100 mg by mouth See admin instructions. Take 1 tablet by mouth twice daily, except Monday and Friday 11/14/22  Yes Lue Elsie BROCKS, MD   Current Facility-Administered Medications  Medication Dose Route Frequency Provider Last Rate Last Admin   0.9 %  sodium chloride  infusion  250 mL Intravenous PRN Pahwani, Rinka R, MD       acetaminophen  (TYLENOL ) tablet 650 mg  650 mg Oral Q4H PRN Pahwani, Rinka R, MD  albuterol  (PROVENTIL ) (2.5 MG/3ML) 0.083% nebulizer solution 2.5 mg  2.5 mg Nebulization Q2H PRN Pahwani, Rinka R, MD   2.5 mg at 04/28/24 0809   alteplase  (CATHFLO ACTIVASE ) injection 2 mg  2 mg Intracatheter Once PRN Gearline Norris, MD       amLODipine  (NORVASC ) tablet 5 mg  5 mg Oral Daily Pahwani, Rinka R, MD       anticoagulant sodium citrate  solution 5 mL  5 mL Intracatheter PRN Gearline Norris, MD       apixaban  (ELIQUIS ) tablet 2.5 mg  2.5 mg Oral BID Pahwani, Rinka  R, MD   2.5 mg at 04/27/24 2241   atorvastatin  (LIPITOR) tablet 40 mg  40 mg Oral Daily Pahwani, Rinka R, MD       budesonide -glycopyrrolate -formoterol  (BREZTRI ) 160-9-4.8 MCG/ACT inhaler 2 puff  2 puff Inhalation Daily Pahwani, Rinka R, MD   2 puff at 04/28/24 0747   Chlorhexidine  Gluconate Cloth 2 % PADS 6 each  6 each Topical Q0600 Gearline Norris, MD       escitalopram  (LEXAPRO ) tablet 10 mg  10 mg Oral Daily Pahwani, Rinka R, MD       furosemide  (LASIX ) injection 40 mg  40 mg Intravenous BID Patel, Pranav M, MD       heparin  injection 1,000 Units  1,000 Units Intracatheter PRN Gearline Norris, MD       heparin  injection 3,000 Units  3,000 Units Dialysis PRN Gearline Norris, MD       HYDROcodone -acetaminophen  (NORCO/VICODIN) 5-325 MG per tablet 1-2 tablet  1-2 tablet Oral Q6H PRN Pahwani, Rinka R, MD   1 tablet at 04/28/24 0401   insulin  aspart (novoLOG ) injection 0-15 Units  0-15 Units Subcutaneous TID WC Pahwani, Rinka R, MD   3 Units at 04/28/24 9180   insulin  aspart (novoLOG ) injection 0-5 Units  0-5 Units Subcutaneous QHS Pahwani, Rinka R, MD       isosorbide  mononitrate (IMDUR ) 24 hr tablet 60 mg  60 mg Oral Daily Pahwani, Rinka R, MD       levothyroxine  (SYNTHROID ) tablet 100 mcg  100 mcg Oral Once per day on Monday Friday Vernon Velna SAUNDERS, MD   100 mcg at 04/28/24 9388   [START ON 04/29/2024] levothyroxine  (SYNTHROID ) tablet 200 mcg  200 mcg Oral Once per day on Sunday Tuesday Wednesday Thursday Saturday Pahwani, Velna SAUNDERS, MD       lidocaine  (PF) (XYLOCAINE ) 1 % injection 5 mL  5 mL Intradermal PRN Gearline Norris, MD       lidocaine -prilocaine  (EMLA ) cream 1 Application  1 Application Topical PRN Gearline Norris, MD       nitroGLYCERIN  (NITROSTAT ) SL tablet 0.4 mg  0.4 mg Sublingual Q5 min PRN Pahwani, Rinka R, MD       ondansetron  (ZOFRAN ) injection 4 mg  4 mg Intravenous Q6H PRN Pahwani, Rinka R, MD       pantoprazole  (PROTONIX ) EC tablet 40 mg  40 mg Oral Daily Pahwani, Rinka R,  MD       pentafluoroprop-tetrafluoroeth (GEBAUERS) aerosol 1 Application  1 Application Topical PRN Gearline Norris, MD       sodium chloride  flush (NS) 0.9 % injection 3 mL  3 mL Intravenous Q12H Pahwani, Rinka R, MD       sodium chloride  flush (NS) 0.9 % injection 3 mL  3 mL Intravenous PRN Pahwani, Rinka R, MD       Current Outpatient Medications  Medication Sig Dispense Refill   albuterol  (PROVENTIL ) (2.5 MG/3ML) 0.083%  nebulizer solution Take 3 mLs (2.5 mg total) by nebulization every 2 (two) hours as needed for wheezing or shortness of breath. 75 mL 5   albuterol  (VENTOLIN  HFA) 108 (90 Base) MCG/ACT inhaler Inhale 2 puffs into the lungs every 6 (six) hours as needed for wheezing or shortness of breath.     amLODipine  (NORVASC ) 5 MG tablet Take 5 mg by mouth in the morning.     apixaban  (ELIQUIS ) 2.5 MG TABS tablet Take 2.5 mg by mouth 2 (two) times daily.     ascorbic acid  (VITAMIN C ) 1000 MG tablet Take 1,000 mg by mouth daily with lunch.     atorvastatin  (LIPITOR) 40 MG tablet TAKE 1 TABLET BY MOUTH DAILY 100 tablet 1   budesonide -glycopyrrolate -formoterol  (BREZTRI  AEROSPHERE) 160-9-4.8 MCG/ACT AERO inhaler Inhale 2 puffs into the lungs in the morning and at bedtime. 10.7 g 0   Cholecalciferol  (VITAMIN D3) 25 MCG (1000 UT) CAPS Take 1,000 Units by mouth daily.     cyanocobalamin  (VITAMIN B12) 1000 MCG tablet Take 1,000 mcg by mouth daily.     docusate sodium  (COLACE) 100 MG capsule Take 1 capsule (100 mg total) by mouth 2 (two) times daily as needed for mild constipation or moderate constipation. 30 capsule 0   escitalopram  (LEXAPRO ) 10 MG tablet Take 10 mg by mouth daily.     fluticasone  (FLONASE ) 50 MCG/ACT nasal spray Place 1 spray into both nostrils 2 (two) times daily as needed for allergies or rhinitis.     HYDROcodone -acetaminophen  (NORCO) 7.5-325 MG tablet Take 1 tablet by mouth every 6 (six) hours as needed for moderate pain (pain score 4-6).     insulin  lispro (HUMALOG  KWIKPEN)  100 UNIT/ML KwikPen Inject 0-9 Units into the skin: Sliding scale insulin  Less than 70 initiate hypoglycemia protocol;  70-120  0 units, 120-150 1 unit; 151-200 2 units; 201-250 3 units; 251-300 5 units;  301-350 7 units; 351-400 9 units. Greater than 400 call MD 15 mL 1   isosorbide  mononitrate (IMDUR ) 60 MG 24 hr tablet Take 1 tablet (60 mg total) by mouth daily. 90 tablet 3   LANTUS  SOLOSTAR 100 UNIT/ML Solostar Pen Inject 15 Units into the skin daily before breakfast. 15 mL 0   levothyroxine  (SYNTHROID ) 200 MCG tablet Take 100-200 mcg by mouth See admin instructions. Take 1/2 tablet by mouth every Monday and Friday, then take 1 tablet all other days     nitroGLYCERIN  (NITROSTAT ) 0.4 MG SL tablet Place 1 tablet (0.4 mg total) under the tongue every 5 (five) minutes as needed for chest pain. 25 tablet 4   omeprazole  (PRILOSEC OTC) 20 MG tablet Take 20 mg by mouth daily at 2 PM.     OXYGEN  Inhale 3 L into the lungs continuous.     Polyethyl Glycol-Propyl Glycol 0.4-0.3 % SOLN Place 1 drop into both eyes as needed (dry eyes, eye irritations.).     sevelamer  carbonate (RENVELA ) 800 MG tablet Take 800 mg by mouth 3 (three) times daily with meals.     torsemide  (DEMADEX ) 100 MG tablet Take 1 tablet (100 mg total) by mouth 2 (two) times daily. (Patient taking differently: Take 100 mg by mouth See admin instructions. Take 1 tablet by mouth twice daily, except Monday and Friday) 60 tablet 0     ROS: As per HPI otherwise negative.  Physical Exam: Vitals:   04/28/24 0945 04/28/24 1015 04/28/24 1030 04/28/24 1045  BP: (!) 140/76 (!) 126/93 (!) 152/120 137/69  Pulse: 89 74 (!) 115 (!)  109  Resp: (!) 22 (!) 25 (!) 30 (!) 24  Temp:      TempSrc:      SpO2: 98% 99% 92% 97%  Weight:      Height:         General: Appears comfortable, in no distress  Head: NCAT sclera not icteric MMM CV: Regular rate, no murmur, no rub  Pulm: normal respiratory effort, lungs clear  Abdomen: non-tender, no masses   Lower extremities: no edema or ischemic changes, no open wounds  Neuro: A & O X 3. Moves all extremities spontaneously. Psych:  Responds to questions appropriately with a normal affect. Dialysis Access:  Labs: Basic Metabolic Panel: Recent Labs  Lab 04/27/24 2052 04/28/24 0615  NA 141 141  K 3.8 3.6  CL 99 101  CO2 28 28  GLUCOSE 255* 156*  BUN 41* 41*  CREATININE 3.16* 3.14*  CALCIUM  8.3* 7.7*   Liver Function Tests: No results for input(s): AST, ALT, ALKPHOS, BILITOT, PROT, ALBUMIN  in the last 168 hours. No results for input(s): LIPASE, AMYLASE in the last 168 hours. No results for input(s): AMMONIA in the last 168 hours. CBC: Recent Labs  Lab 04/27/24 2052 04/28/24 0615  WBC 12.3* 11.9*  HGB 10.0* 9.6*  HCT 33.4* 32.7*  MCV 101.2* 100.9*  PLT 354 312   Cardiac Enzymes: No results for input(s): CKTOTAL, CKMB, CKMBINDEX, TROPONINI in the last 168 hours. CBG: Recent Labs  Lab 04/27/24 2322 04/28/24 0813  GLUCAP 173* 187*   Iron Studies: No results for input(s): IRON, TIBC, TRANSFERRIN, FERRITIN in the last 72 hours. Studies/Results: DG Chest 1 View Result Date: 04/28/2024 CLINICAL DATA:  Status post thoracentesis. EXAM: CHEST  1 VIEW COMPARISON:  04/27/2024 FINDINGS: Interval decrease in right pleural effusion. No discernible right-sided pneumothorax after thoracentesis. Persistent right base collapse/consolidation with small persistent bilateral pleural effusions. Right IJ central line remains in place. Telemetry leads overlie the chest. IMPRESSION: Interval decrease in right pleural effusion after thoracentesis. No discernible pneumothorax. Electronically Signed   By: Camellia Candle M.D.   On: 04/28/2024 10:15   DG Chest 2 View Result Date: 04/27/2024 CLINICAL DATA:  Shortness of breath cough EXAM: CHEST - 2 VIEW COMPARISON:  04/12/2024 FINDINGS: Right sided central venous catheter tip at the SVC. Cardiomegaly. Moderate right  and small left pleural effusions. Vascular congestion. Airspace disease at the right middle lobe and right base. Hardware in the cervical spine. Numerous clips at the thoracic inlet IMPRESSION: Cardiomegaly with vascular congestion. Moderate right and small left pleural effusions. Airspace disease at the right middle lobe and right base may be due to atelectasis or pneumonia. Electronically Signed   By: Luke Bun M.D.   On: 04/27/2024 20:40    Dialysis Orders:  Unit: [facility] Schedule: Mon/Fri Time: 3:30  EDW: 81.2 kg Flows: 400/800 Bath: 2K/2.5Ca  Access: TDC/L AVF Heparin : None ESA: Mircera 100 q 2 wks (last 12/12) Venofer  100 x 5 (1/5 doses) VDRA: Hectorol  8 q HD   Assessment/Plan: Acute on chronic respiratory failure. Multifactorial. Pulm edema + pleural effusions on CXR.  S/p thoracentesis. HD today for further volume removal.  ESRD.  Last HD 12/21.  Usual HD Mon/Fri. Plan for HD today. Advised that she would benefit from 3x/week HD. Recommended by her OP nephrologist. She is skeptical.  Access. LUE AVF in use.  Hypertension. BP acceptable  Volume. Optimize volume with HD. Also on torsemide  daily.  Anemia. Hgb 9.6 Due for ESA today - will order Metabolic bone disease.  Continue home meds. Follow Ca/Phos here  Nutrition. Renal diet with fluid restriction  Chronic resp failure. 3L [place] at baseline.   Maisie Ronnald Acosta PA-C [facility] 04/28/2024, 10:53 AM           [1]  Allergies Allergen Reactions   Tape Other (See Comments)    SKIN IS VERY THIN- TEARS EASILY!!   Oxycodone  Itching and Other (See Comments)    Pt still takes this med even thought it causes itching   Trelegy Ellipta  [Fluticasone -Umeclidin-Vilant] Nausea And Vomiting, Other (See Comments) and Cough    Made the patient sick to her stomach (only) several days after using it    Trelegy Ellipta  [Fluticasone -Umeclidin-Vilant] Nausea And Vomiting   Trulicity [Dulaglutide] Nausea And Vomiting,  Nausea Only, Other (See Comments) and Cough    TRULICITY made the patient sick to her stomach (only) several days after using it   Trulicity [Dulaglutide] Nausea And Vomiting

## 2024-04-28 NOTE — Progress Notes (Signed)
 Received patient in bed to unit.  Alert and oriented x's 4 but pulled out IV and handed it to the nurse, bleeding stopped, gauze applied Informed consent signed and in chart.   TX duration: 3.5  Patient tolerated well.  Transported back to the room ED, waiting for bed Alert, without acute distress.  Hand-off given to patient's nurse. ED Nurse  Access used: dialysis catheter Access issues: None  Total UF removed: 2000 Medication(s) given: Pain med 1 Post HD VS: T98.3-HR102-B/p140/77 Post HD weight: 81kg  Neville Seip, RN [facility]   04/28/24 1930  Vitals  Temp 98.3 F (36.8 C)  Temp Source Oral  BP (!) 140/77  MAP (mmHg) 92  BP Location Right Arm  BP Method Automatic  Patient Position (if appropriate) Lying  Pulse Rate (!) 102  Pulse Rate Source Monitor  ECG Heart Rate (!) 105  Resp (!) 41  Weight 81 kg  Type of Weight Post-Dialysis  Oxygen  Therapy  SpO2 99 %  O2 Device Nasal Cannula  O2 Flow Rate (L/min) 4 L/min  Patient Activity (if Appropriate) In bed  Pulse Oximetry Type Continuous  During Treatment Monitoring  Blood Flow Rate (mL/min) 0 mL/min  Arterial Pressure (mmHg) -2.02 mmHg  Venous Pressure (mmHg) 2.42 mmHg  TMP (mmHg) -45.45 mmHg  Ultrafiltration Rate (mL/min) 773 mL/min  Dialysate Flow Rate (mL/min) 299 ml/min  Dialysate Potassium Concentration 2  Dialysate Calcium  Concentration 2.5  Duration of HD Treatment -hour(s) 3.5 hour(s)  Cumulative Fluid Removed (mL) per Treatment  2000.13  HD Safety Checks Performed Yes  Intra-Hemodialysis Comments Tx completed  Post Treatment  Dialyzer Clearance Heavily streaked  Liters Processed 69.1  Fluid Removed (mL) 2000 mL  Tolerated HD Treatment Yes  Post-Hemodialysis Comments Patient pulled out IV  Fistula / Graft Left Upper arm Arteriovenous fistula  Placement Date/Time: 04/13/23 (c) 1053   Placed prior to admission: No  Orientation: Left  Access Location: Upper arm  Access Type: (c)  Arteriovenous fistula  Site Condition No complications  Hemodialysis Catheter Right Internal jugular Double lumen Permanent (Tunneled)  Placement Date/Time: 11/04/22 1254   Serial / Lot #: [id number]  Expiration Date: 05/04/27  Time Out: Correct patient;Correct site;Correct procedure  Maximum sterile barrier precautions: Hand hygiene;Cap;Mask;Sterile gown;Sterile gloves;Large sterile s...  Site Condition No complications  Blue Lumen Status Flushed;Antimicrobial dead end cap;Heparin  locked  Red Lumen Status Flushed;Antimicrobial dead end cap;Heparin  locked  Purple Lumen Status N/A  Catheter fill solution Heparin  1000 units/ml  Catheter fill volume (Arterial) 1.6 cc  Catheter fill volume (Venous) 1.6  Dressing Type Transparent  Dressing Status Antimicrobial disc/dressing in place;Clean, Dry, Intact  Drainage Description None  Dressing Change Due 05/05/24  Post treatment catheter status Capped and Clamped

## 2024-04-28 NOTE — ED Notes (Signed)
 Unsuccessful venipuncture x2.SABRASABRAKM

## 2024-04-28 NOTE — Progress Notes (Signed)
 Heart Failure Navigator Progress Note  Assessed for Heart & Vascular TOC clinic readiness.  Patient does not meet criteria due to ESRD on Hemodialysis. No HF TOC. .   Navigator will sign off at this time.   Stephane Haddock, BSN, Scientist, clinical (histocompatibility and immunogenetics) Only

## 2024-04-28 NOTE — Procedures (Signed)
 PROCEDURE SUMMARY:  Successful image-guided diagnostic and therapeutic thoracentesis from the right chest.  Yielded 1.2 liters of clear, yellow fluid.  No immediate complications.  EBL: zero Patient tolerated well.   Specimen sent for labs.  Post-procedure CXR ordered and performed prior to departure from department.   Please see imaging section of Epic for full dictation.  Derionna Salvador NP 04/28/2024 10:20 AM

## 2024-04-28 NOTE — Progress Notes (Signed)
 Pt receives out-pt HD at [facility], [place], 1025 am chair time. Will continue to assist as needed.    Lavanda Katoria Yetman Dialysis Navigator [phone]

## 2024-04-28 NOTE — Progress Notes (Signed)
 [facility] Progress Note Patient: Kayla [name] FMW:[id number] DOB: Nov 22, 1940  DOA: 04/27/2024 DOS: the patient was seen and examined on 04/28/2024  Brief Hospital Course: Patient with PMH of ESRD on HD MF, HLD, HTN, CAD, paroxysmal A-fib, sick sinus syndrome s/p pacemaker implant, COPD, type II DM, neuropathy, hypothyroidism, OSA on CPAP, chronic respiratory failure presented to the hospital with complaints of cough and shortness of breath. Found to have large pleural effusion on the right. Underwent thoracentesis. Assessment and Plan: Acute on chronic hypoxic respiratory failure secondary to recurrent large right pleural effusion. Presents with shortness of breath. Uses 3 LPM at baseline.  Chest x-ray shows evidence of large pleural effusion on the right.  Saturation dropped below 90 and therefore patient is on 4 LPM right now. Treated with IV Lasix . Underwent IR guided thoracentesis with more than 1 L removed. Oxygenation currently already improving. Patient undergoing hemodialysis. Monitor overnight for stability.  Recurrent large right pleural effusion. Etiology is not clear.  But possibility of poor clearance and persistent volume overload secondary to patient's twice a day HD only is high on the differential with heart failure. Pleural fluid analysis is currently pending. Monitor overnight. Repeat chest x-ray tomorrow.  ESRD on HD. Twice a day only Monday and Friday. Last HD was on Sunday. At present nephrology is considering hemodialysis today. Patient is recommended to consider 3 times a day HD.  History of COPD. Currently no evidence of exacerbation right now. For now monitor.  Elevated troponin. Mostly in the setting of poor clearance as well as demand ischemia. Monitor for now.  HTN. Takes amlodipine  and Imdur  at home. Blood pressure stable.  Will monitor.  Paroxysmal A-fib. Not on any rate control medication. Currently sinus. Eliquis .  Type II  DM. Without long-term insulin  use, with renal dysfunction. A1c 6.2. Currently Scale insulin .  Hypothyroidism. Continue Synthroid .  HLD. Continue statin.  Anemia chronic disease  Management per nephrology.    Subjective: Feeling better after thoracentesis.  No nausea no vomiting.  Still has some cough and shortness of breath as well as some chest pain.  Physical Exam: General: in Mild distress, No Rash Cardiovascular: S1 and S2 Present, No Murmur Respiratory: Good respiratory effort, Bilateral Air entry present.  Basal rate crackles, No wheezes Abdomen: Bowel Sound present, No tenderness Extremities: No edema Neuro: Alert and oriented x3, no new focal deficit   Data Reviewed: I have Reviewed nursing notes, Vitals, and Lab results. Since last encounter, pertinent lab results CBC and BMP   . I have ordered test including CBC and BMP  . I have discussed pt's care plan and test results with nephrology  .   Disposition: Status is: Inpatient Remains inpatient appropriate because: Monitor for improvement in volume status, respiratory status  apixaban  (ELIQUIS ) tablet 2.5 mg Start: 04/27/24 2200 SCDs Start: 04/27/24 2145 apixaban  (ELIQUIS ) tablet 2.5 mg   Family Communication: Family at bedside Level of care: Telemetry   Vitals:   04/28/24 1600 04/28/24 1630 04/28/24 1645 04/28/24 1700  BP: (!) 151/76 139/63 (!) 141/67 138/82  Pulse: (!) 108 80 96 77  Resp: (!) 34 (!) 25 (!) 21 (!) 35  Temp:      TempSrc:      SpO2: 100% 100% 99% 100%  Weight:      Height:         Author: Yetta Blanch, MD 04/28/2024 5:30 PM  Please look on [email or web address] to find out who is on call.

## 2024-04-28 NOTE — Progress Notes (Signed)
" ° °  Brief Progress Note   _____________________________________________________________________________________________________________  Patient Name: Kayla [name] Patient DOB: [birth year] Date: @TODAY @      Data: 83 yo female currently awaiting admission to a Telemetry bed at [facility].     Action: Reviewed the patient's vital signs, lab results, and clinical notes to assess if patient appropriate to level-load from [facility] for admission.    Response: Pt has a history of ESRD and on HD. Patient will remain at [facility] awaiting bed assignment.  _____________________________________________________________________________________________________________  [facility] RN Expeditor Gal Feldhaus S Petrona Wyeth Please contact us  directly via secure chat (search for [facility]) or by calling us  at [phone] [facility]).  "

## 2024-04-29 ENCOUNTER — Inpatient Hospital Stay (HOSPITAL_COMMUNITY)

## 2024-04-29 DIAGNOSIS — I5033 Acute on chronic diastolic (congestive) heart failure: Secondary | ICD-10-CM | POA: Diagnosis not present

## 2024-04-29 LAB — BASIC METABOLIC PANEL WITH GFR
Anion gap: 12 (ref 5–15)
BUN: 24 mg/dL — ABNORMAL HIGH (ref 8–23)
CO2: 24 mmol/L (ref 22–32)
Calcium: 8.5 mg/dL — ABNORMAL LOW (ref 8.9–10.3)
Chloride: 97 mmol/L — ABNORMAL LOW (ref 98–111)
Creatinine, Ser: 2.09 mg/dL — ABNORMAL HIGH (ref 0.44–1.00)
GFR, Estimated: 23 mL/min — ABNORMAL LOW
Glucose, Bld: 148 mg/dL — ABNORMAL HIGH (ref 70–99)
Potassium: 4.2 mmol/L (ref 3.5–5.1)
Sodium: 133 mmol/L — ABNORMAL LOW (ref 135–145)

## 2024-04-29 LAB — CBC
HCT: 33.1 % — ABNORMAL LOW (ref 36.0–46.0)
Hemoglobin: 10 g/dL — ABNORMAL LOW (ref 12.0–15.0)
MCH: 30.4 pg (ref 26.0–34.0)
MCHC: 30.2 g/dL (ref 30.0–36.0)
MCV: 100.6 fL — ABNORMAL HIGH (ref 80.0–100.0)
Platelets: 282 K/uL (ref 150–400)
RBC: 3.29 MIL/uL — ABNORMAL LOW (ref 3.87–5.11)
RDW: 15.1 % (ref 11.5–15.5)
WBC: 11.2 K/uL — ABNORMAL HIGH (ref 4.0–10.5)
nRBC: 0 % (ref 0.0–0.2)

## 2024-04-29 LAB — MAGNESIUM: Magnesium: 1.9 mg/dL (ref 1.7–2.4)

## 2024-04-29 LAB — GLUCOSE, CAPILLARY
Glucose-Capillary: 142 mg/dL — ABNORMAL HIGH (ref 70–99)
Glucose-Capillary: 151 mg/dL — ABNORMAL HIGH (ref 70–99)
Glucose-Capillary: 76 mg/dL (ref 70–99)
Glucose-Capillary: 402 mg/dL — ABNORMAL HIGH (ref 70–99)
Glucose-Capillary: 387 mg/dL — ABNORMAL HIGH (ref 70–99)

## 2024-04-29 MED ORDER — GUAIFENESIN ER 600 MG PO TB12
600.0000 mg | ORAL_TABLET | Freq: Two times a day (BID) | ORAL | Status: DC
  Administered 2024-04-29 – 2024-05-03 (×9): 600 mg via ORAL
  Filled 2024-04-29 (×4): qty 1

## 2024-04-29 MED ORDER — DARBEPOETIN ALFA 100 MCG/0.5ML IJ SOSY
100.0000 ug | PREFILLED_SYRINGE | Freq: Once | INTRAMUSCULAR | Status: AC
  Administered 2024-04-29: 100 ug via SUBCUTANEOUS
  Filled 2024-04-29: qty 0.5

## 2024-04-29 MED ORDER — REVEFENACIN 175 MCG/3ML IN SOLN
175.0000 ug | Freq: Every day | RESPIRATORY_TRACT | Status: DC
  Administered 2024-04-30 – 2024-05-02 (×3): 175 ug via RESPIRATORY_TRACT
  Filled 2024-04-29: qty 3

## 2024-04-29 MED ORDER — SODIUM CHLORIDE 0.9 % IV SOLN
1.5000 g | Freq: Two times a day (BID) | INTRAVENOUS | Status: DC
  Administered 2024-04-29 – 2024-05-02 (×6): 1.5 g via INTRAVENOUS
  Filled 2024-04-29 (×4): qty 4

## 2024-04-29 MED ORDER — FUROSEMIDE 10 MG/ML IJ SOLN
40.0000 mg | Freq: Once | INTRAMUSCULAR | Status: AC
  Administered 2024-04-29: 40 mg via INTRAVENOUS
  Filled 2024-04-29: qty 4

## 2024-04-29 MED ORDER — BENZONATATE 100 MG PO CAPS
100.0000 mg | ORAL_CAPSULE | Freq: Three times a day (TID) | ORAL | Status: DC
  Administered 2024-04-29 – 2024-05-03 (×12): 100 mg via ORAL
  Filled 2024-04-29 (×5): qty 1

## 2024-04-29 MED ORDER — HALOPERIDOL LACTATE 5 MG/ML IJ SOLN: 1.0000 mg | Freq: Four times a day (QID) | INTRAMUSCULAR | Status: DC | PRN

## 2024-04-29 MED ORDER — FUROSEMIDE 10 MG/ML IJ SOLN
80.0000 mg | Freq: Two times a day (BID) | INTRAMUSCULAR | Status: DC
  Administered 2024-04-29 – 2024-05-01 (×4): 80 mg via INTRAVENOUS
  Filled 2024-04-29 (×3): qty 8

## 2024-04-29 MED ORDER — BUDESONIDE 0.25 MG/2ML IN SUSP
0.2500 mg | Freq: Two times a day (BID) | RESPIRATORY_TRACT | Status: DC
  Administered 2024-04-29 – 2024-05-02 (×7): 0.25 mg via RESPIRATORY_TRACT
  Filled 2024-04-29 (×2): qty 2

## 2024-04-29 MED ORDER — ARFORMOTEROL TARTRATE 15 MCG/2ML IN NEBU
15.0000 ug | INHALATION_SOLUTION | Freq: Two times a day (BID) | RESPIRATORY_TRACT | Status: DC
  Administered 2024-04-29 – 2024-05-02 (×7): 15 ug via RESPIRATORY_TRACT
  Filled 2024-04-29 (×2): qty 2

## 2024-04-29 MED ORDER — DEXTROMETHORPHAN POLISTIREX ER 30 MG/5ML PO SUER
15.0000 mg | Freq: Two times a day (BID) | ORAL | Status: DC
  Administered 2024-04-29 – 2024-05-03 (×9): 15 mg via ORAL
  Filled 2024-04-29 (×4): qty 5

## 2024-04-29 MED ORDER — METHYLPREDNISOLONE SODIUM SUCC 40 MG IJ SOLR
40.0000 mg | Freq: Every day | INTRAMUSCULAR | Status: DC
  Administered 2024-04-29 – 2024-05-02 (×4): 40 mg via INTRAVENOUS
  Filled 2024-04-29 (×2): qty 1

## 2024-04-29 NOTE — Progress Notes (Signed)
 [facility] Progress Note Patient: Kayla [name] FMW:[id number] DOB: 09-Feb-1941  DOA: 04/27/2024 DOS: the patient was seen and examined on 04/29/2024  Brief Hospital Course: Patient with PMH of ESRD on HD MF, HLD, HTN, CAD, paroxysmal A-fib, sick sinus syndrome s/p pacemaker implant, COPD, type II DM, neuropathy, hypothyroidism, OSA on CPAP, chronic respiratory failure presented to the hospital with complaints of cough and shortness of breath. Found to have large pleural effusion on the right. Underwent thoracentesis. Assessment and Plan: Acute on chronic hypoxic respiratory failure secondary to recurrent large right pleural effusion. Presents with shortness of breath. Uses 3 LPM at baseline.  Chest x-ray shows evidence of large pleural effusion on the right.  Saturation dropped below 90 and therefore patient is on 4 LPM right now. Treated with IV Lasix . Underwent IR guided thoracentesis with more than 1 L removed. Oxygenation currently already improving. Patient undergoing hemodialysis. Patient with ongoing shortness of breath.  CT scan was ordered of the chest which shows evidence of possible pneumonia.  Initiating aspiration pneumonia treatment with Unasyn .  Also given history of severe COPD initiating treatment for COPD exacerbation given wheezing.  Monitor.  Recurrent large right pleural effusion. Etiology is not clear.  But possibility of poor clearance and persistent volume overload secondary to patient's twice a day HD only is high on the differential with heart failure. Pleural fluid analysis is currently pending.  ESRD on HD. Twice a day only Monday and Friday. Last HD was on Sunday. At present nephrology is considering hemodialysis today. Patient is recommended to consider 3 times a day HD.  Elevated troponin. Mostly in the setting of poor clearance as well as demand ischemia. Monitor for now.  HTN. Takes amlodipine  and Imdur  at home. Blood pressure stable.  Will  monitor.  Paroxysmal A-fib. Not on any rate control medication. Currently sinus. Eliquis .  Type II DM. Without long-term insulin  use, with renal dysfunction. A1c 6.2. Currently Scale insulin .  Hypothyroidism. Continue Synthroid .  HLD. Continue statin.  Anemia chronic disease  Management per nephrology.    Subjective: No chest pain but severe shortness of breath.  No nausea or vomiting also significant cough.  Physical Exam: In moderate distress. S1-S2 present.  No murmur. Bowel sound present. Increased respiratory distress with abdominal breathing. Basal crackles as well as bilateral expiratory wheezing. Trace edema. No focal deficit.  Data Reviewed: I have Reviewed nursing notes, Vitals, and Lab results. Ordered CT chest. Reviewed CT chest. Discussed with nephrology. Ordered CBC and BMP. Repeat CBC and BMP.  Disposition: Status is: Inpatient Remains inpatient appropriate because: Monitor for improvement in volume status, respiratory status  apixaban  (ELIQUIS ) tablet 2.5 mg Start: 04/27/24 2200 SCDs Start: 04/27/24 2145 apixaban  (ELIQUIS ) tablet 2.5 mg   Family Communication: Family at bedside Level of care: Telemetry   Vitals:   04/29/24 0033 04/29/24 0459 04/29/24 0607 04/29/24 0755  BP: (!) 144/80 (!) 146/97  (!) 148/77  Pulse: 78 (!) 102  89  Resp: 20 18  20   Temp: 97.6 F (36.4 C) 98.7 F (37.1 C)  98.7 F (37.1 C)  TempSrc: Oral Oral  Oral  SpO2: 98% 97%  94%  Weight:   77.8 kg   Height:         Author: Yetta Blanch, MD 04/29/2024 6:20 PM  Please look on [email or web address] to find out who is on call.

## 2024-04-29 NOTE — Progress Notes (Signed)
 [facility] NEPHROLOGY PROGRESS NOTE  Assessment/ Plan: Pt is a 83 y.o. yo female  with ESRD on HD, hypertension, hyperlipidemia, CAD, CHF, paroxysmal atrial fibrillation on Eliquis , SSS s/p PM, carotid artery disease, COPD, diabetes mellitus type 2 with polyneuropathy, anemia of chronic disease, hypothyroidism, and OSA on CPAP at night, chronic respiratory failure who is admitted with worsening shortness of breath.  Dialysis Orders:  Unit: [facility] Schedule: Mon/Fri Time: 3:30  EDW: 81.2 kg Flows: 400/800 Bath: 2K/2.5Ca  Access: TDC/L AVF Heparin : None ESA: Mircera 100 q 2 wks (last 12/12) Venofer  100 x 5 (1/5 doses) VDRA: Hectorol  8 q HD  # Acute on chronic respiratory failure. Multifactorial. Pulm edema + pleural effusions on CXR.  S/p thoracentesis. UF w/HD.   #ESRD.  Usual HD Mon/Fri.  Dialysis yesterday with 2 L UF. Advised that she would benefit from 3x/week HD.  Next dialysis on Monday which can be done outpatient if patient is discharged. Access. LUE AVF in use.   #Hypertension/vol. BP acceptable, optimize volume with UF.  #Anemia. Hgb 10, continue ESA.    # CKD-Metabolic bone disease.  Continue home meds. Follow Ca/Phos here.  #Chronic resp failure. 3L [place] at baseline.  Subjective: Seen and examined.  Reports her breathing is much better.  Denies nausea, vomiting, chest pain.  Her husband at the bedside.  Received dialysis yesterday with around 2 L UF.  Objective Vital signs in last 24 hours: Vitals:   04/29/24 0033 04/29/24 0459 04/29/24 0607 04/29/24 0755  BP: (!) 144/80 (!) 146/97  (!) 148/77  Pulse: 78 (!) 102  89  Resp: 20 18  20   Temp: 97.6 F (36.4 C) 98.7 F (37.1 C)  98.7 F (37.1 C)  TempSrc: Oral Oral  Oral  SpO2: 98% 97%  94%  Weight:   77.8 kg   Height:       Weight change: -2 kg  Intake/Output Summary (Last 24 hours) at 04/29/2024 1011 Last data filed at 04/29/2024 0104 Gross per 24 hour  Intake --  Output 2001 ml  Net -2001 ml        Labs: RENAL PANEL Recent Labs  Lab 04/27/24 2052 04/27/24 2157 04/28/24 0615 04/29/24 0426  NA 141  --  141 133*  K 3.8  --  3.6 4.2  CL 99  --  101 97*  CO2 28  --  28 24  GLUCOSE 255*  --  156* 148*  BUN 41*  --  41* 24*  CREATININE 3.16*  --  3.14* 2.09*  CALCIUM  8.3*  --  7.7* 8.5*  MG  --  1.5* 3.3* 1.9    Liver Function Tests: No results for input(s): AST, ALT, ALKPHOS, BILITOT, PROT, ALBUMIN  in the last 168 hours. No results for input(s): LIPASE, AMYLASE in the last 168 hours. No results for input(s): AMMONIA in the last 168 hours. CBC: Recent Labs    01/19/24 0541 01/20/24 0458 03/21/24 0217 03/24/24 0201 04/27/24 2052 04/28/24 0615 04/29/24 0426  HGB 11.0*   < > 14.0 11.2* 10.0* 9.6* 10.0*  MCV 96.6   < > 94.8 97.2 101.2* 100.9* 100.6*  VITAMINB12 968*  --   --   --   --   --   --   FOLATE 12.3  --   --   --   --   --   --   FERRITIN 654*  --   --   --   --   --   --  TIBC 249*  --   --   --   --   --   --   IRON 24*  --   --   --   --   --   --   RETICCTPCT 2.1  --   --   --   --   --   --    < > = values in this interval not displayed.    Cardiac Enzymes: No results for input(s): CKTOTAL, CKMB, CKMBINDEX, TROPONINI in the last 168 hours. CBG: Recent Labs  Lab 04/28/24 1206 04/28/24 2026 04/28/24 2035 04/28/24 2218 04/29/24 0639  GLUCAP 147* 141* 148* 147* 142*    Iron Studies: No results for input(s): IRON, TIBC, TRANSFERRIN, FERRITIN in the last 72 hours. Studies/Results: IR THORACENTESIS ASP PLEURAL SPACE W/IMG GUIDE Result Date: 04/28/2024 INDICATION: Thoracentesis Inpatient with CHF with complaint of SOB and cough, recent CXR demonstrating moderate right pleural effusion, request for thoracentesis. She has not previously undergone this procedure. EXAM: ULTRASOUND GUIDED RIGHT THORACENTESIS MEDICATIONS: 10 mL 1% lidocaine  with epinephrine  COMPLICATIONS: None immediate. PROCEDURE: An ultrasound  guided thoracentesis was thoroughly discussed with the patient and questions answered. The benefits, risks, alternatives and complications were also discussed. The patient understands and wishes to proceed with the procedure. Written consent was obtained. Ultrasound was performed to localize and mark an adequate pocket of fluid in the right chest. The area was then prepped and draped in the normal sterile fashion. 1% Lidocaine  was used for local anesthesia. Under ultrasound guidance a 6 Fr Safe-T-Centesis catheter was introduced. Thoracentesis was performed. The catheter was removed and a dressing applied. FINDINGS: A total of approximately 1.2 L of clear, yellow fluid was removed. Samples were sent to the laboratory as requested by the clinical team. IMPRESSION: Successful ultrasound guided RIGHT thoracentesis yielding 1.2 L of pleural fluid. Performed and dictated by Laymon Coast, NP under the supervision of Dr. Hughes Electronically Signed   By: Thom Hughes M.D.   On: 04/28/2024 18:31   DG Chest 1 View Result Date: 04/28/2024 CLINICAL DATA:  Status post thoracentesis. EXAM: CHEST  1 VIEW COMPARISON:  04/27/2024 FINDINGS: Interval decrease in right pleural effusion. No discernible right-sided pneumothorax after thoracentesis. Persistent right base collapse/consolidation with small persistent bilateral pleural effusions. Right IJ central line remains in place. Telemetry leads overlie the chest. IMPRESSION: Interval decrease in right pleural effusion after thoracentesis. No discernible pneumothorax. Electronically Signed   By: Camellia Candle M.D.   On: 04/28/2024 10:15   DG Chest 2 View Result Date: 04/27/2024 CLINICAL DATA:  Shortness of breath cough EXAM: CHEST - 2 VIEW COMPARISON:  04/12/2024 FINDINGS: Right sided central venous catheter tip at the SVC. Cardiomegaly. Moderate right and small left pleural effusions. Vascular congestion. Airspace disease at the right middle lobe and right base.  Hardware in the cervical spine. Numerous clips at the thoracic inlet IMPRESSION: Cardiomegaly with vascular congestion. Moderate right and small left pleural effusions. Airspace disease at the right middle lobe and right base may be due to atelectasis or pneumonia. Electronically Signed   By: Luke Bun M.D.   On: 04/27/2024 20:40    Medications: Infusions:   Scheduled Medications:  amLODipine   5 mg Oral Daily   apixaban   2.5 mg Oral BID   atorvastatin   40 mg Oral Daily   budesonide -glycopyrrolate -formoterol   2 puff Inhalation Daily   Chlorhexidine  Gluconate Cloth  6 each Topical Q0600   darbepoetin (ARANESP ) injection - NON-DIALYSIS  100 mcg Subcutaneous Once  escitalopram   10 mg Oral Daily   furosemide   40 mg Intravenous BID   insulin  aspart  0-15 Units Subcutaneous TID WC   insulin  aspart  0-5 Units Subcutaneous QHS   isosorbide  mononitrate  60 mg Oral Daily   levothyroxine   100 mcg Oral Once per day on Monday Friday   levothyroxine   200 mcg Oral Once per day on Sunday Tuesday Wednesday Thursday Saturday   pantoprazole   40 mg Oral Daily   sodium chloride  flush  3 mL Intravenous Q12H    have reviewed scheduled and prn medications.  Physical Exam: General:NAD, comfortable Heart:RRR, s1s2 nl Lungs:clear b/l, no crackle Abdomen:soft, Non-tender, non-distended Extremities:No edema Dialysis Access: AV fistula.  Cherissa Hook Prasad Chevis Weisensel 04/29/2024,10:11 AM  LOS: 2 days

## 2024-04-29 NOTE — Plan of Care (Signed)
" °  Problem: Coping: Goal: Ability to adjust to condition or change in health will improve Outcome: Progressing   Problem: Health Behavior/Discharge Planning: Goal: Ability to manage health-related needs will improve Outcome: Progressing   Problem: Metabolic: Goal: Ability to maintain appropriate glucose levels will improve Outcome: Progressing   Problem: Nutritional: Goal: Maintenance of adequate nutrition will improve Outcome: Progressing   Problem: Tissue Perfusion: Goal: Adequacy of tissue perfusion will improve Outcome: Progressing   Problem: Health Behavior/Discharge Planning: Goal: Ability to manage health-related needs will improve Outcome: Progressing   Problem: Clinical Measurements: Goal: Respiratory complications will improve Outcome: Progressing Goal: Cardiovascular complication will be avoided Outcome: Progressing   Problem: Coping: Goal: Level of anxiety will decrease Outcome: Progressing   "

## 2024-04-29 NOTE — Progress Notes (Signed)
 Pt is agitated and restless and pulling on her lines, and thinks her husband is in the room.
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
## 2024-04-30 ENCOUNTER — Other Ambulatory Visit (HOSPITAL_COMMUNITY): Payer: Self-pay

## 2024-04-30 DIAGNOSIS — I5033 Acute on chronic diastolic (congestive) heart failure: Secondary | ICD-10-CM | POA: Diagnosis not present

## 2024-04-30 LAB — PROTEIN, BODY FLUID (OTHER): Total Protein, Body Fluid Other: 2.2 g/dL

## 2024-04-30 LAB — BASIC METABOLIC PANEL WITH GFR
Anion gap: 14 (ref 5–15)
BUN: 42 mg/dL — ABNORMAL HIGH (ref 8–23)
CO2: 27 mmol/L (ref 22–32)
Calcium: 9 mg/dL (ref 8.9–10.3)
Chloride: 96 mmol/L — ABNORMAL LOW (ref 98–111)
Creatinine, Ser: 3.04 mg/dL — ABNORMAL HIGH (ref 0.44–1.00)
GFR, Estimated: 15 mL/min — ABNORMAL LOW
Glucose, Bld: 240 mg/dL — ABNORMAL HIGH (ref 70–99)
Potassium: 4 mmol/L (ref 3.5–5.1)
Sodium: 137 mmol/L (ref 135–145)

## 2024-04-30 LAB — GLUCOSE, CAPILLARY
Glucose-Capillary: 225 mg/dL — ABNORMAL HIGH (ref 70–99)
Glucose-Capillary: 229 mg/dL — ABNORMAL HIGH (ref 70–99)
Glucose-Capillary: 299 mg/dL — ABNORMAL HIGH (ref 70–99)
Glucose-Capillary: 279 mg/dL — ABNORMAL HIGH (ref 70–99)

## 2024-04-30 LAB — LD, BODY FLUID (OTHER): LD, Body Fluid: 79 IU/L

## 2024-04-30 LAB — GLUCOSE, BODY FLUID OTHER: Glucose, Body Fluid Other: 171 mg/dL

## 2024-04-30 MED ORDER — INSULIN GLARGINE-YFGN 100 UNIT/ML ~~LOC~~ SOLN: 10.0000 [IU] | Freq: Every day | SUBCUTANEOUS | Status: DC

## 2024-04-30 MED ORDER — INSULIN GLARGINE 100 UNIT/ML ~~LOC~~ SOLN
10.0000 [IU] | Freq: Every day | SUBCUTANEOUS | Status: DC
  Filled 2024-04-30: qty 0.1

## 2024-04-30 MED ORDER — INSULIN GLARGINE 100 UNIT/ML ~~LOC~~ SOLN
15.0000 [IU] | Freq: Every day | SUBCUTANEOUS | Status: DC
  Administered 2024-04-30 – 2024-05-03 (×4): 15 [IU] via SUBCUTANEOUS
  Filled 2024-04-30 (×2): qty 0.15

## 2024-04-30 MED ORDER — CHLORHEXIDINE GLUCONATE CLOTH 2 % EX PADS
6.0000 | MEDICATED_PAD | Freq: Every day | CUTANEOUS | Status: DC
  Administered 2024-04-30 – 2024-05-02 (×3): 6 via TOPICAL

## 2024-04-30 NOTE — Progress Notes (Signed)
 [name] NEPHROLOGY PROGRESS NOTE  Assessment/ Plan: Pt is a 83 y.o. yo female  with ESRD on HD, hypertension, hyperlipidemia, CAD, CHF, paroxysmal atrial fibrillation on Eliquis , SSS s/p PM, carotid artery disease, COPD, diabetes mellitus type 2 with polyneuropathy, anemia of chronic disease, hypothyroidism, and OSA on CPAP at night, chronic respiratory failure who is admitted with worsening shortness of breath.  Dialysis Orders:  Unit: [facility] Schedule: Mon/Fri Time: 3:30  EDW: 81.2 kg Flows: 400/800 Bath: 2K/2.5Ca  Access: TDC/L AVF Heparin : None ESA: Mircera 100 q 2 wks (last 12/12) Venofer  100 x 5 (1/5 doses) VDRA: Hectorol  8 q HD  # Acute on chronic respiratory failure. Multifactorial. Pulm edema + pleural effusions on CXR.  S/p thoracentesis. UF w/HD.  Also has increased urine output with diuretics.  On antibiotics for possible aspiration pneumonia.  #ESRD.  Usual HD Mon/Fri.  Dialysis on Friday with 2 L UF. Advised that she would benefit from 3x/week HD.  Next dialysis on tomorrow. Access. LUE AVF in use.   #Hypertension/vol. BP acceptable, optimize volume with UF.  #Anemia. Hgb 10, continue ESA.    # CKD-Metabolic bone disease.  Continue home meds. Follow Ca/Phos here.  #Chronic resp failure. 3L Jet at baseline.  Subjective: Seen and examined.  Some coughing.  Breathing is not much improved per patient.  No chest pain.  No nausea or vomiting.  Her husband was accompanying her Objective Vital signs in last 24 hours: Vitals:   04/30/24 0000 04/30/24 0500 04/30/24 0739 04/30/24 0752  BP: 124/72  (!) 141/70   Pulse: 88  68   Resp: 20 15 17    Temp: 98.4 F (36.9 C)  98.5 F (36.9 C)   TempSrc: Oral  Oral   SpO2: 98%  99% 96%  Weight:  77.7 kg    Height:       Weight change: -3.3 kg  Intake/Output Summary (Last 24 hours) at 04/30/2024 1100 Last data filed at 04/30/2024 9178 Gross per 24 hour  Intake 1040 ml  Output 1400 ml  Net -360 ml        Labs: RENAL PANEL Recent Labs  Lab 04/27/24 2052 04/27/24 2157 04/28/24 0615 04/29/24 0426  NA 141  --  141 133*  K 3.8  --  3.6 4.2  CL 99  --  101 97*  CO2 28  --  28 24  GLUCOSE 255*  --  156* 148*  BUN 41*  --  41* 24*  CREATININE 3.16*  --  3.14* 2.09*  CALCIUM  8.3*  --  7.7* 8.5*  MG  --  1.5* 3.3* 1.9    Liver Function Tests: No results for input(s): AST, ALT, ALKPHOS, BILITOT, PROT, ALBUMIN  in the last 168 hours. No results for input(s): LIPASE, AMYLASE in the last 168 hours. No results for input(s): AMMONIA in the last 168 hours. CBC: Recent Labs    01/19/24 0541 01/20/24 0458 03/21/24 0217 03/24/24 0201 04/27/24 2052 04/28/24 0615 04/29/24 0426  HGB 11.0*   < > 14.0 11.2* 10.0* 9.6* 10.0*  MCV 96.6   < > 94.8 97.2 101.2* 100.9* 100.6*  VITAMINB12 968*  --   --   --   --   --   --   FOLATE 12.3  --   --   --   --   --   --   FERRITIN 654*  --   --   --   --   --   --   TIBC 249*  --   --   --   --   --   --  IRON 24*  --   --   --   --   --   --   RETICCTPCT 2.1  --   --   --   --   --   --    < > = values in this interval not displayed.    Cardiac Enzymes: No results for input(s): CKTOTAL, CKMB, CKMBINDEX, TROPONINI in the last 168 hours. CBG: Recent Labs  Lab 04/29/24 1239 04/29/24 1525 04/29/24 2058 04/29/24 2133 04/30/24 0619  GLUCAP 151* 76 402* 387* 225*    Iron Studies: No results for input(s): IRON, TIBC, TRANSFERRIN, FERRITIN in the last 72 hours. Studies/Results: CT CHEST WO CONTRAST Result Date: 04/29/2024 CLINICAL DATA:  Pleural effusion, malignancy suspected. EXAM: CT CHEST WITHOUT CONTRAST TECHNIQUE: Multidetector CT imaging of the chest was performed following the standard protocol without IV contrast. RADIATION DOSE REDUCTION: This exam was performed according to the departmental dose-optimization program which includes automated exposure control, adjustment of the mA and/or kV  according to patient size and/or use of iterative reconstruction technique. COMPARISON:  03/19/2024. FINDINGS: Cardiovascular: The heart is enlarged and there is a trace pericardial effusion. Three-vessel coronary artery calcifications are noted. The distal tip of a right internal jugular central venous catheter terminates in the superior vena cava. There is atherosclerotic calcification of the aorta without evidence of aneurysm. The pulmonary trunk is distended suggesting underlying pulmonary artery hypertension. Mediastinum/Nodes: Enlarged lymph nodes are present in the mediastinum measuring up to 1.1 cm in the precarinal space and 1 cm in the subcarinal space. No axillary lymphadenopathy. Evaluation of the hila is limited due to lack of IV contrast. The thyroid  gland is surgically absent. The trachea and esophagus are within normal limits. Lungs/Pleura: Paraseptal and centrilobular emphysematous changes are present in the lungs. There is bilateral basilar consolidation, greater on the right than on the left, increased on the left from the prior exam. There is a small to moderate pleural effusion on the right and trace pleural effusion on the left. No pneumothorax is seen. Upper Abdomen: The gallbladder is surgically absent. No acute abnormality. Musculoskeletal: Degenerative changes are present in the thoracic spine. Cervical spinal fusion hardware is present. Stable rib fractures are noted bilaterally. No acute osseous abnormality is seen. IMPRESSION: 1. Persistent bibasilar consolidation, increased on the left and stable on the right. 2. Stable small to moderate right pleural effusion and trace left pleural effusion. 3. Emphysema. 4. Stable bilateral rib fractures. 5. Aortic atherosclerosis and coronary artery calcifications. Electronically Signed   By: Leita Birmingham M.D.   On: 04/29/2024 14:34   DG CHEST PORT 1 VIEW Result Date: 04/29/2024 CLINICAL DATA:  Chest pain. EXAM: PORTABLE CHEST 1 VIEW COMPARISON:   Radiograph earlier today FINDINGS: Right-sided dialysis catheter stable in positioning. Right pleural effusion and basilar opacity, question slight increase. Stable heart size and mediastinal contours. No demonstrated pneumothorax. Mild patchy opacity at the left lung base. IMPRESSION: 1. Right pleural effusion and basilar opacity, question slight increase. 2. Mild patchy opacity at the left lung base. Electronically Signed   By: Andrea Gasman M.D.   On: 04/29/2024 12:18    Medications: Infusions:  ampicillin -sulbactam (UNASYN ) IV Stopped (04/30/24 0449)    Scheduled Medications:  amLODipine   5 mg Oral Daily   apixaban   2.5 mg Oral BID   arformoterol   15 mcg Nebulization BID   atorvastatin   40 mg Oral Daily   benzonatate   100 mg Oral TID   budesonide  (PULMICORT ) nebulizer solution  0.25 mg Nebulization  BID   Chlorhexidine  Gluconate Cloth  6 each Topical Q0600   dextromethorphan   15 mg Oral BID   escitalopram   10 mg Oral Daily   furosemide   80 mg Intravenous BID   guaiFENesin   600 mg Oral BID   insulin  aspart  0-15 Units Subcutaneous TID WC   insulin  aspart  0-5 Units Subcutaneous QHS   insulin  glargine  15 Units Subcutaneous Daily   isosorbide  mononitrate  60 mg Oral Daily   levothyroxine   100 mcg Oral Once per day on Monday Friday   levothyroxine   200 mcg Oral Once per day on Sunday Tuesday Wednesday Thursday Saturday   methylPREDNISolone  (SOLU-MEDROL ) injection  40 mg Intravenous Daily   pantoprazole   40 mg Oral Daily   revefenacin   175 mcg Nebulization Daily   sodium chloride  flush  3 mL Intravenous Q12H    have reviewed scheduled and prn medications.  Physical Exam: General:NAD, comfortable Heart:RRR, s1s2 nl Lungs: No increased work of breathing, clear sound. Abdomen:soft, Non-tender, non-distended Extremities:No edema Dialysis Access: AV fistula.  Aislinn Feliz Prasad Maxmillian Carsey 04/30/2024,11:00 AM  LOS: 3 days
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
## 2024-04-30 NOTE — Plan of Care (Signed)

## 2024-04-30 NOTE — Progress Notes (Signed)
 [facility] Progress Note Patient: Kayla [name] FMW:[id number] DOB: 23-Apr-1941  DOA: 04/27/2024 DOS: the patient was seen and examined on 04/30/2024  Brief Hospital Course: Patient with PMH of ESRD on HD MF, HLD, HTN, CAD, paroxysmal A-fib, sick sinus syndrome s/p pacemaker implant, COPD, type II DM, neuropathy, hypothyroidism, OSA on CPAP, chronic respiratory failure presented to the hospital with complaints of cough and shortness of breath. Found to have large pleural effusion on the right. Underwent thoracentesis.  Assessment and Plan: Acute on chronic hypoxic respiratory failure secondary to recurrent large right pleural effusion. Acute COPD exacerbation secondary to bilateral pneumonia Presents with shortness of breath. Uses 3 LPM at baseline.  Needed 4 LPM upon admission. Chest x-ray shows evidence of large pleural effusion on the right.  Treated with IV Lasix . Underwent IR guided thoracentesis with more than 1 L removed. After Thora patient reported persistent shortness of breath with accessory muscle use. Therefore CT chest was ordered which showed evidence of bilateral infiltrate with possible loculated pleural effusion. Started on IV antibiotic with Unasyn  for aspiration. Also given history of severe COPD initiating treatment for COPD exacerbation given wheezing.  Monitor.   Recurrent large right pleural effusion. Etiology is not clear. Pleural fluid culture was not sent. Lights criteria not met as well.  So most likely transudative effusion. But possibility of poor clearance and persistent volume overload secondary to patient's twice a day HD only is high on the differential with heart failure. Discussed with patient that she needs to consider 3 times weekly hemodialysis.  ESRD on HD. Twice a day only Monday and Friday. Patient is recommended to consider 3 times a day HD.   Elevated troponin. Mostly in the setting of poor clearance as well as demand  ischemia. Monitor for now.   HTN. Takes amlodipine  and Imdur  at home. Blood pressure stable.  Will monitor.   Paroxysmal A-fib. Not on any rate control medication. Currently sinus. Eliquis .   Type II DM.Without long-term insulin  use, with renal dysfunction. A1c 6.2. Currently on sliding scale insulin .   Hypothyroidism. Continue Synthroid .   HLD. Continue statin.   Anemia of chronic disease  Management per nephrology.     Subjective: Feeling better but still with respiratory rate in 20s.  Denies any chest pain.  No nausea or vomiting no fever no chills.  Ongoing cough but improving.  Physical Exam: Improving bilateral expiratory wheezing. Persistent basilar crackles. S1-S2 present. Bowel sounds normal No edema. No focal deficit.  Data Reviewed: I have Reviewed nursing notes, Vitals, and Lab results. Since last encounter, pertinent lab results CBC BMP   . I have ordered test including CBC and BMP  .   Disposition: Status is: Inpatient Remains inpatient appropriate because: Monitor for improvement in volume status  apixaban  (ELIQUIS ) tablet 2.5 mg Start: 04/27/24 2200 SCDs Start: 04/27/24 2145 apixaban  (ELIQUIS ) tablet 2.5 mg   Family Communication: Husband at bedside Level of care: Telemetry   Vitals:   04/30/24 0739 04/30/24 0752 04/30/24 1128 04/30/24 1558  BP: (!) 141/70  133/63 (!) 143/58  Pulse: 68  83 82  Resp: 17  17 19   Temp: 98.5 F (36.9 C)  98.1 F (36.7 C) 99 F (37.2 C)  TempSrc: Oral  Oral Oral  SpO2: 99% 96% 96% 96%  Weight:      Height:         Author: Yetta Blanch, MD 04/30/2024 6:36 PM  Please look on [email or web address] to find out who is on call.

## 2024-04-30 NOTE — Hospital Course (Signed)
 Patient with PMH of ESRD on HD MF, HLD, HTN, CAD, paroxysmal A-fib, sick sinus syndrome s/p pacemaker implant, COPD, type II DM, neuropathy, hypothyroidism, OSA on CPAP, chronic respiratory failure presented to the hospital with complaints of cough and shortness of breath. Found to have large pleural effusion on the right. Underwent thoracentesis. Assessment and Plan: Acute on chronic hypoxic respiratory failure secondary to recurrent large right pleural effusion. Acute COPD exacerbation secondary to bilateral pneumonia Presents with shortness of breath. Uses 3 LPM at baseline.  Needed 4 LPM upon admission. Chest x-ray shows evidence of large pleural effusion on the right.  Treated with IV Lasix . Underwent IR guided thoracentesis with more than 1 L removed. After Thora patient reported persistent shortness of breath with accessory muscle use. Therefore CT chest was ordered which showed evidence of bilateral infiltrate with possible loculated pleural effusion. Started on IV antibiotic with Unasyn  for aspiration. Also given history of severe COPD initiating treatment for COPD exacerbation given wheezing.  While the patient is on home oxygen  continues to struggle to breathe.  Pulmonary consulted for further assistance. Currently suspect that the patient's current symptom burden is going to be her new normal. Added BuSpar  for anxiety.   Recurrent large right pleural effusion. Etiology is not clear. Pleural fluid culture was not sent. Lights criteria not met as well.  So most likely transudative effusion. But possibility of poor clearance and persistent volume overload secondary to patient's twice a day HD only is high on the differential with heart failure. Discussed with patient that she needs to consider 3 times weekly hemodialysis. Pulmonary consulted.  No further workup recommended for now.  ESRD on HD. Twice a day only Monday and Friday. Patient is recommended to consider 3 times a day  HD. Patient will receive hemodialysis tomorrow and then discharge.  Will continue outpatient hemodialysis Monday Wednesday Friday.  Elevated troponin. Mostly in the setting of poor clearance as well as demand ischemia. Monitor for now.   HTN. Takes amlodipine  and Imdur  at home. Blood pressure stable.  Will monitor.   Paroxysmal A-fib. Not on any rate control medication. Currently sinus. Eliquis .   Type II DM.Without long-term insulin  use, with renal dysfunction. A1c 6.2. Currently on sliding scale insulin .   Hypothyroidism. Continue Synthroid .   HLD. Continue statin.   Anemia of chronic disease  Management per nephrology.

## 2024-05-01 DIAGNOSIS — I5033 Acute on chronic diastolic (congestive) heart failure: Secondary | ICD-10-CM | POA: Diagnosis not present

## 2024-05-01 DIAGNOSIS — J90 Pleural effusion, not elsewhere classified: Secondary | ICD-10-CM

## 2024-05-01 LAB — MAGNESIUM: Magnesium: 1.9 mg/dL (ref 1.7–2.4)

## 2024-05-01 LAB — RENAL FUNCTION PANEL
Albumin: 3.1 g/dL — ABNORMAL LOW (ref 3.5–5.0)
Anion gap: 15 (ref 5–15)
BUN: 56 mg/dL — ABNORMAL HIGH (ref 8–23)
CO2: 25 mmol/L (ref 22–32)
Calcium: 8.6 mg/dL — ABNORMAL LOW (ref 8.9–10.3)
Chloride: 97 mmol/L — ABNORMAL LOW (ref 98–111)
Creatinine, Ser: 3.94 mg/dL — ABNORMAL HIGH (ref 0.44–1.00)
GFR, Estimated: 11 mL/min — ABNORMAL LOW
Glucose, Bld: 233 mg/dL — ABNORMAL HIGH (ref 70–99)
Phosphorus: 4.8 mg/dL — ABNORMAL HIGH (ref 2.5–4.6)
Potassium: 4.1 mmol/L (ref 3.5–5.1)
Sodium: 137 mmol/L (ref 135–145)

## 2024-05-01 LAB — GLUCOSE, CAPILLARY
Glucose-Capillary: 178 mg/dL — ABNORMAL HIGH (ref 70–99)
Glucose-Capillary: 240 mg/dL — ABNORMAL HIGH (ref 70–99)
Glucose-Capillary: 213 mg/dL — ABNORMAL HIGH (ref 70–99)
Glucose-Capillary: 182 mg/dL — ABNORMAL HIGH (ref 70–99)

## 2024-05-01 LAB — CBC
HCT: 31.8 % — ABNORMAL LOW (ref 36.0–46.0)
Hemoglobin: 9.7 g/dL — ABNORMAL LOW (ref 12.0–15.0)
MCH: 29.9 pg (ref 26.0–34.0)
MCHC: 30.5 g/dL (ref 30.0–36.0)
MCV: 98.1 fL (ref 80.0–100.0)
Platelets: 385 K/uL (ref 150–400)
RBC: 3.24 MIL/uL — ABNORMAL LOW (ref 3.87–5.11)
RDW: 14.8 % (ref 11.5–15.5)
WBC: 12.5 K/uL — ABNORMAL HIGH (ref 4.0–10.5)
nRBC: 0 % (ref 0.0–0.2)

## 2024-05-01 LAB — HEPATITIS B SURFACE ANTIBODY, QUANTITATIVE: Hep B S AB Quant (Post): 3572 m[IU]/mL

## 2024-05-01 MED ORDER — ACETAMINOPHEN 500 MG PO TABS
1000.0000 mg | ORAL_TABLET | Freq: Once | ORAL | Status: DC
  Filled 2024-05-01 (×2): qty 2

## 2024-05-01 MED ORDER — ACETAMINOPHEN 325 MG PO TABS
ORAL_TABLET | ORAL | Status: AC
  Filled 2024-05-01: qty 2

## 2024-05-01 MED ORDER — DOXERCALCIFEROL 4 MCG/2ML IV SOLN
8.0000 ug | INTRAVENOUS | Status: DC
  Filled 2024-05-01: qty 4

## 2024-05-01 MED ORDER — TORSEMIDE 100 MG PO TABS
100.0000 mg | ORAL_TABLET | Freq: Two times a day (BID) | ORAL | Status: DC
  Administered 2024-05-01 – 2024-05-03 (×4): 100 mg via ORAL
  Filled 2024-05-01 (×4): qty 1

## 2024-05-01 MED ORDER — HEPARIN SODIUM (PORCINE) 1000 UNIT/ML IJ SOLN
INTRAMUSCULAR | Status: AC
  Filled 2024-05-01: qty 4

## 2024-05-01 MED ORDER — DARBEPOETIN ALFA 100 MCG/0.5ML IJ SOSY: 100.0000 ug | PREFILLED_SYRINGE | INTRAMUSCULAR | Status: DC

## 2024-05-01 NOTE — Progress Notes (Signed)
 Contacted by nephrologist regarding out-pt HD schedule recommendations.  Nephrologist recommending pt be dialyzed 3x week instead of the two (currently MF only). Contacted [facility] [place] and informed them of this recommendation. They stated this can be accommodated and she will be okay to start as 3x week on MWF. Will continue to assist as needed.   Lavanda Kamarius [place] Dialysis Navigator [phone]

## 2024-05-01 NOTE — Consult Note (Signed)
 "  NAME:  Kayla [name], MRN:  [id number], DOB:  Aug 13, 1940, LOS: 4 ADMISSION DATE:  04/27/2024, CONSULTATION DATE:  05/01/24 REFERRING MD:  JONETTA Blanch, CHIEF COMPLAINT: Shortness of breath, COPD exacerbation  History of Present Illness:  83 year old lady who was admitted with shortness of breath.  Febrile history of COPD, chronic respiratory failure, end-stage renal disease on hemodialysis, coronary artery disease, paroxysmal atrial fibrillation.  Came in with worsening shortness of breath noted to have a large right pleural effusion for which she had thoracentesis for about a liter of fluid Continues to complain of shortness of breath Reason for pulmonary consult She is known to me in the outpatient setting  Pertinent  Medical History   Past Medical History:  Diagnosis Date   Allergy    Anxiety    Arthritis    CAD (coronary artery disease)    Mild per remote cath in 2006, /    Nuclear, January, 2013, no significant abnormality,   Carotid artery disease    Doppler, January, 2013, 0 39% RIC A., 40-59% LICA, stable   Cataract    CHF (congestive heart failure) (HCC)    Chronic kidney disease    dialysis Mon Friday   Complication of anesthesia    wakes up mean   COPD 02/16/2007   Intolerant of Spiriva , tudorza Intolerant of Trelegy    COPD (chronic obstructive pulmonary disease) (HCC)    Coronary atherosclerosis 11/24/2008   Catheterization 2006, nonobstructive coronary disease   //   nuclear 2013, low risk     Depression    Diabetes mellitus, type 2 (HCC)    Diverticula of colon    DIVERTICULOSIS OF COLON 03/23/2007   Qualifier: Diagnosis of  By: Shellia MD, Vineet     Dizziness    occasional   Dizzy spells 05/04/2011   DM (diabetes mellitus) (HCC) 03/23/2007   Qualifier: Diagnosis of  By: Shellia MD, Vineet     Dyspnea    On exertion   Ejection fraction    Essential hypertension 02/16/2007   Qualifier: Diagnosis of  By: Nicholaus CMA, Tammy     G E R D 03/23/2007   Qualifier:  Diagnosis of  By: Shellia MD, Vineet     Gall bladder disease 04/28/2016   Gastritis and gastroduodenitis    GERD (gastroesophageal reflux disease)    Goiter    hx of   Headache(784.0)    migraine   Hyperlipidemia    Hypertension    Hypothyroidism    HYPOTHYROIDISM, POSTSURGICAL 06/04/2008   Qualifier: Diagnosis of  By: Kassie MD, Sean A    Left ventricular hypertrophy    Leg edema    occasional swelling   Leg swelling 12/09/2018   Myofascial pain syndrome, cervical 01/06/2018   Obesity    OBESITY 11/24/2008   Qualifier: Diagnosis of  By: Jaramillo, Luz     OBSTRUCTIVE SLEEP APNEA 02/16/2007   Auto CPAP 09/03/13 to 10/02/13 >> used on 30 of 30 nights with average 6 hrs and 3 min.  Average AHI is 3.1 with median CPAP 5 cm H2O and 95 th percentile CPAP 6 cm H2O.     Occipital neuralgia of right side 01/06/2018   OSA (obstructive sleep apnea)    uses cpap - setting of 12   Pain in joint of right hip 01/21/2018   Palpitations 01/21/2011   48 hour Holter, 2015, scattered PACs and PVCs.    Paroxysmal atrial fibrillation (HCC)    Pneumonia    PONV (postoperative  nausea and vomiting)    severe after every surgery   Pulmonary hypertension, secondary    RHINITIS 07/21/2010        RUQ abdominal pain    S/P left TKA 07/27/2011   Sinus node dysfunction (HCC) 11/08/2015   Sleep apnea    Spinal stenosis of cervical region 11/07/2014   Urinary frequency 12/09/2018   UTI (urinary tract infection) 08/15/2019   per pt report   VENTRICULAR HYPERTROPHY, LEFT 11/24/2008   Qualifier: Diagnosis of  By: Justina Kos     Significant Hospital Events: Including procedures, antibiotic start and stop dates in addition to other pertinent events   12/25-admitted for shortness of breath 12/26 ultrasound-guided thoracentesis for 1 L of fluid  Interim History / Subjective:  Shortness of breath Denies chest pains or chest discomfort No overnight events  Objective    Blood pressure (!) 162/88,  pulse 89, temperature 97.8 F (36.6 C), temperature source Oral, resp. rate 20, height 5' 5 (1.651 m), weight 77.9 kg, SpO2 96%.        Intake/Output Summary (Last 24 hours) at 05/01/2024 2041 Last data filed at 05/01/2024 8191 Gross per 24 hour  Intake 728.64 ml  Output 3325 ml  Net -2596.36 ml   Filed Weights   04/29/24 0607 04/30/24 0500 05/01/24 0456  Weight: 77.8 kg 77.7 kg 77.9 kg    Examination: General: Elderly, does not appear to be in distress HENT: Moist oral mucosa Lungs: Decreased air movement bilaterally Cardiovascular: S1-S2 appreciated Abdomen: Soft, bowel sounds appreciated Extremities: No clubbing, no edema Neuro: Awake alert oriented, nonfocal GU:   I reviewed last 24 h vitals and pain scores, last 48 h intake and output, last 24 h labs and trends, and last 24 h imaging results. Echocardiogram September 2025 reveals grade 2 diastolic dysfunction moderately elevated pulmonary artery systolic pressure  Resolved problem list   Assessment and Plan   Recurrent right pleural effusion S/p thoracentesis for 1 L of fluid  Shortness of breath is multifactorial - Likely from underlying chronic obstructive pulmonary disease, moderate to severe pulmonary hypertension, deconditioning  Concern for aspiration pneumonia On empiric antibiotics Unasyn  - Can be transitioned to Augmentin   Pulmonary hypertension - Likely from group 2 and group 3  Probable advanced chronic obstructive pulmonary disease -Compliant with Breztri  use - Last PFT was in 2017 showing severe obstructive disease with severe reduction in FEV1 - Continue Breztri  - Plan will be to add Ensifentrine  as outpatient, doses twice daily - Yupelri  added to nebulization treatment in the house  Acute on chronic hypoxemic respiratory failure - Continue oxygen  supplementation  Hypertension Paroxysmal atrial fibrillation Hyperlipidemia  Addition of Ensifentrine  will have to be done in the outpatient  setting Treatment for pulmonary hypertension is to treat the underlying disorders which in her case is diastolic dysfunction and obstructive lung disease  Will follow  Jennet Epley, MD [facility] Pager: See Amion         "

## 2024-05-01 NOTE — TOC Initial Note (Signed)
 Transition of Care [facility]) - Initial/Assessment Note    Patient Details  Name: Kayla [name] MRN: [id number] Date of Birth: 06/26/1940  Transition of [facility]) CM/SW Contact:    Andrez JULIANNA George, RN Phone Number: 05/01/2024, 3:44 PM  Clinical Narrative:                  Kayla [name] is a 83 y.o. female with medical history significant of ESRD on HD (Mondays and Fridays), hypertension, hyperlipidemia, CAD, CHF, paroxysmal atrial fibrillation on Eliquis , SSS s/p PM, carotid artery disease, COPD, diabetes mellitus type 2 with polyneuropathy, anemia of chronic disease, hypothyroidism, and OSA on CPAP at night, chronic hypoxemic respiratory failure presents worsening shortness of breath since several months.  Pt is from home with her spouse. They are together most of the time.  Pt manges her own medications.  Spouse and pt drive.  Pt is active with home oxygen  through [employer].  Pt is active with [facility] for home health services and wants to continue with [facility].   IP Care management following for further d/c needs.  Expected Discharge Plan: Home w Home Health Services Barriers to Discharge: Continued Medical Work up   Patient Goals and CMS Choice   CMS Medicare.gov Compare Post Acute Care list provided to:: Patient Choice offered to / list presented to : Patient      Expected Discharge Plan and Services   Discharge Planning Services: CM Consult Post Acute Care Choice: Home Health Living arrangements for the past 2 months: Single Family Home                           HH Arranged: PT, OT HH Agency: [facility]        Prior Living Arrangements/Services Living arrangements for the past 2 months: Single Family Home Lives with:: Spouse Patient language and need for interpreter reviewed:: Yes Do you feel safe going back to the place where you live?: Yes        Care giver support system in place?: Yes (comment) Current home services: DME (oxygen /  rollator/ shower seat/ walker/ cane/ BSC/ wheelchair) Criminal Activity/Legal Involvement Pertinent to Current Situation/Hospitalization: No - Comment as needed  Activities of Daily Living      Permission Sought/Granted                  Emotional Assessment Appearance:: Appears stated age Attitude/Demeanor/Rapport: Engaged Affect (typically observed): Accepting Orientation: : Oriented to Self, Oriented to Place, Oriented to  Time, Oriented to Situation   Psych Involvement: No (comment)  Admission diagnosis:  Shortness of breath [R06.02] CHF (congestive heart failure) (HCC) [I50.9] Paroxysmal atrial fibrillation (HCC) [I48.0] Pleural effusion [J90] Patient Active Problem List   Diagnosis Date Noted   CHF (congestive heart failure) (HCC) 04/27/2024   Acute metabolic encephalopathy 03/19/2024   Multiple rib fractures 03/19/2024   Acute respiratory failure (HCC) 03/10/2024   Altered mental status 03/10/2024   ESRD (end stage renal disease) (HCC) 01/19/2024   COPD exacerbation (HCC) 01/18/2024   Elevated troponin 01/18/2024   Pleural effusion 01/18/2024   CAP (community acquired pneumonia) 01/18/2024   COPD with acute exacerbation (HCC) 08/29/2023   Respiratory distress 08/29/2023   ESRD on dialysis MWF schedule (HCC) 08/29/2023   Anemia of chronic disease due to ESRD 08/29/2023   Hypothyroidism 08/29/2023   Chronic pain syndrome 08/29/2023   Generalized anxiety disorder 08/29/2023   Acute on chronic respiratory failure with hypoxia (HCC)  10/25/2022   Junctional bradycardia 10/25/2022   Hyperkalemia 10/25/2022   Bradycardia 10/01/2022   MRSA bacteremia 10/01/2022   AKI (acute kidney injury) 10/01/2022   Complication of central venous catheter 10/01/2022   Osteomyelitis (HCC) 10/01/2022   Closed nondisplaced fracture of proximal phalanx of right great toe 09/06/2022   Pyogenic inflammation of bone (HCC) 09/05/2022   Acute hematogenous osteomyelitis of left foot (HCC)  09/04/2022   Diabetic foot infection (HCC) 09/03/2022   Chronic hypoxic respiratory failure (HCC) 02/20/2022   Pleuritic pain 02/20/2022   Toe osteomyelitis, left (HCC) 01/20/2022   Carotid bruit 07/07/2021   Septic prepatellar bursitis of right knee 05/30/2021   Cellulitis of right lower extremity 03/19/2021   Chronic kidney disease, stage 3b (HCC) 03/19/2021   Polymyalgia rheumatica 04/15/2020   Temporal arteritis (HCC) 04/15/2020   Orthostatic lightheadedness 01/24/2020   Chronic osteomyelitis involving left ankle and foot (HCC)    Paroxysmal atrial fibrillation (HCC) 10/25/2019   Secondary hypercoagulable state 10/25/2019   Numbness and tingling of right leg 10/19/2019   Lumbar radiculopathy 10/19/2019   Chronic diastolic heart failure (HCC) 08/13/2019   Chest pain 08/13/2019   Leukocytosis 08/13/2019   Leg swelling 12/09/2018   Urinary frequency 12/09/2018   Pain in joint of right hip 01/21/2018   Myofascial pain syndrome, cervical 01/06/2018   Occipital neuralgia of right side 01/06/2018   RUQ abdominal pain    Gastritis and gastroduodenitis    Bile duct abnormality    Cholecystitis 04/30/2016   Gall bladder disease 04/28/2016   Sinus node dysfunction (HCC) 11/08/2015   Ejection fraction    Spinal stenosis of cervical region 11/07/2014   Preoperative clearance 10/09/2014   Carotid artery disease    S/P left TKA 07/27/2011   Dizzy spells 05/04/2011   Palpitations 01/21/2011   RHINITIS 07/21/2010   Hyperlipidemia associated with type 2 diabetes mellitus (HCC) 11/24/2008   OBESITY 11/24/2008   Coronary atherosclerosis 11/24/2008   VENTRICULAR HYPERTROPHY, LEFT 11/24/2008   HYPOTHYROIDISM, POSTSURGICAL 06/04/2008   Headache 06/04/2008   Insulin  dependent type 2 diabetes mellitus (HCC) 03/23/2007   Esophageal reflux 03/23/2007   DIVERTICULOSIS OF COLON 03/23/2007   GENERALIZED OSTEOARTHROSIS UNSPECIFIED SITE 03/23/2007   OSA (obstructive sleep apnea) 02/16/2007    Essential hypertension 02/16/2007   COPD (chronic obstructive pulmonary disease) (HCC) 02/16/2007   PCP:  Stephanie Charlene CROME, MD Pharmacy:   [id number] [place], [place] - [address] AT [facility] [address] [place] [address] Phone: [phone] Fax: [phone]  [facility]) - [place], [place] - [address] [facility] [address] [place] [address] [place] [address] Phone: [phone] Fax: [phone]  [facility] - [place], [place] - [address] [place] [address] Phone: [phone] Fax: [phone]  [facility] [address] [place] [address] Phone: [phone] Fax: [phone]     Social Drivers of Health (SDOH) Social History: SDOH Screenings   Food Insecurity: No Food Insecurity (04/29/2024)  Housing: Low Risk (04/29/2024)  Transportation Needs: No Transportation Needs (04/29/2024)  Utilities: Not At Risk (04/29/2024)  Depression (PHQ2-9): Low Risk (12/15/2022)  Physical Activity: Insufficiently Active (09/30/2021)  Social Connections: Unknown (04/29/2024)  Stress: No Stress Concern Present (09/30/2021)  Tobacco Use: Medium Risk (04/27/2024)   SDOH Interventions:     Readmission Risk Interventions    03/13/2024    3:53 PM 01/19/2024   11:24 AM 11/14/2022  9:23 AM  Readmission Risk Prevention Plan  Transportation Screening Complete Complete Complete  PCP or Specialist Appt within 5-7 Days Complete    Home Care Screening Complete    Medication Review (RN CM) Referral to Pharmacy    Medication Review (RN Care Manager)  Referral to Pharmacy Complete  PCP or Specialist appointment within 3-5 days of discharge   Complete  HRI or Home Care Consult  Complete Complete  SW Recovery Care/Counseling Consult  Complete Complete  Palliative Care Screening  Not Applicable Not Applicable  Skilled Nursing Facility   Not Applicable Not Applicable

## 2024-05-01 NOTE — Progress Notes (Signed)
 [facility] Progress Note Patient: Kayla [name] FMW:[id number] DOB: 05-19-1940  DOA: 04/27/2024 DOS: the patient was seen and examined on 05/01/2024  Brief Hospital Course: Patient with PMH of ESRD on HD MF, HLD, HTN, CAD, paroxysmal A-fib, sick sinus syndrome s/p pacemaker implant, COPD, type II DM, neuropathy, hypothyroidism, OSA on CPAP, chronic respiratory failure presented to the hospital with complaints of cough and shortness of breath. Found to have large pleural effusion on the right. Underwent thoracentesis.  Assessment and Plan: Acute on chronic hypoxic respiratory failure secondary to recurrent large right pleural effusion. Acute COPD exacerbation secondary to bilateral pneumonia Presents with shortness of breath. Uses 3 LPM at baseline.  Needed 4 LPM upon admission. Chest x-ray shows evidence of large pleural effusion on the right.  Treated with IV Lasix . Underwent IR guided thoracentesis with more than 1 L removed. After Thora patient reported persistent shortness of breath with accessory muscle use. Therefore CT chest was ordered which showed evidence of bilateral infiltrate with possible loculated pleural effusion. Started on IV antibiotic with Unasyn  for aspiration. Also given history of severe COPD initiating treatment for COPD exacerbation given wheezing.  While the patient is on home oxygen  continues to struggle to breathe.  Pulmonary consulted for further assistance.   Recurrent large right pleural effusion. Etiology is not clear. Pleural fluid culture was not sent. Lights criteria not met as well.  So most likely transudative effusion. But possibility of poor clearance and persistent volume overload secondary to patient's twice a day HD only is high on the differential with heart failure. Discussed with patient that she needs to consider 3 times weekly hemodialysis. Pulmonary consulted.  Will monitor recommendation.  ESRD on HD. Twice a day only Monday  and Friday. Patient is recommended to consider 3 times a day HD.   Elevated troponin. Mostly in the setting of poor clearance as well as demand ischemia. Monitor for now.   HTN. Takes amlodipine  and Imdur  at home. Blood pressure stable.  Will monitor.   Paroxysmal A-fib. Not on any rate control medication. Currently sinus. Eliquis .   Type II DM.Without long-term insulin  use, with renal dysfunction. A1c 6.2. Currently on sliding scale insulin .   Hypothyroidism. Continue Synthroid .   HLD. Continue statin.   Anemia of chronic disease  Management per nephrology.     Subjective: Ongoing short of breath.  No nausea no vomiting.  Significant cough reported by patient as well. Unable to sleep at nighttime.  Physical Exam: Bilateral expiratory wheezing. Basilar crackles. S1 S2 present. Bowel sounds Tachycardic. No edema.  Data Reviewed: I have Reviewed nursing notes, Vitals, and Lab results. Since last encounter, pertinent lab results reviewed CBC and BMP.   . I have ordered test including reorder CBC and BMP.  .  Discussed with pulmonary  Disposition: Status is: Inpatient Remains inpatient appropriate because: Monitor for improvement in respiratory status  apixaban  (ELIQUIS ) tablet 2.5 mg Start: 04/27/24 2200 SCDs Start: 04/27/24 2145 apixaban  (ELIQUIS ) tablet 2.5 mg    Family Communication: Husband at bedside Level of care: Telemetry   Vitals:   05/01/24 1730 05/01/24 1800 05/01/24 1805 05/01/24 1808  BP: (!) 136/118 (!) 153/76 (!) 164/117 (!) 146/93  Pulse: (!) 109 (!) 113 (!) 44 (!) 57  Resp: (!) 29 20 17  (!) 25  Temp:    97.8 F (36.6 C)  TempSrc:      SpO2: 99% 99% 99% 98%  Weight:      Height:  Author: Yetta Blanch, MD 05/01/2024 6:51 PM  Please look on [email or web address] to find out who is on call.

## 2024-05-01 NOTE — Inpatient Diabetes Management (Signed)
 Inpatient Diabetes Program Recommendations  AACE/ADA: New Consensus Statement on Inpatient Glycemic Control (2015)  Target Ranges:  Prepandial:   less than 140 mg/dL      Peak postprandial:   less than 180 mg/dL (1-2 hours)      Critically ill patients:  140 - 180 mg/dL   Lab Results  Component Value Date   GLUCAP 240 (H) 05/01/2024   HGBA1C 6.2 (H) 03/11/2024    Review of Glycemic Control  Latest Reference Range & Units 04/30/24 15:55 04/30/24 22:05 05/01/24 06:09 05/01/24 11:05  Glucose-Capillary 70 - 99 mg/dL 700 (H) 720 (H) 821 (H) 240 (H)   Diabetes history: DM 2 Outpatient Diabetes medications:  Humalog  0-9 units tid with meals Lantus  15 units daily Current orders for Inpatient glycemic control:  Novolog  0-15 units tid with meals and HS Lantus  15 units daily Inpatient Diabetes Program Recommendations:    Please consider adding Novolog  3 units tid with meals (meal coverage-hold if patient eats less than 50% or NPO).   Thanks,  Randall Bullocks, RN, BC-ADM Inpatient Diabetes Coordinator Pager [phone]  (8a-5p)

## 2024-05-01 NOTE — Progress Notes (Signed)
" °   05/01/24 1805  Vitals  BP (!) 164/117  Pulse Rate (!) 44  Resp 17  Oxygen  Therapy  SpO2 99 %  O2 Device Nasal Cannula  Patient Activity (if Appropriate) In bed  Pulse Oximetry Type Continuous  Oximetry Probe Site Changed No  During Treatment Monitoring  Blood Flow Rate (mL/min) 400 mL/min  Arterial Pressure (mmHg) -191.91 mmHg  Venous Pressure (mmHg) 194.54 mmHg  TMP (mmHg) 7.88 mmHg  Ultrafiltration Rate (mL/min) 1114 mL/min  Dialysate Flow Rate (mL/min) 300 ml/min  Dialysate Potassium Concentration 3  Dialysate Calcium  Concentration 2.5  Duration of HD Treatment -hour(s) 3.15 hour(s)  Cumulative Fluid Removed (mL) per Treatment  2610.82  HD Safety Checks Performed Yes  Intra-Hemodialysis Comments Tolerated well;Tx completed  Hemodialysis Catheter Right Internal jugular Double lumen Permanent (Tunneled)  Placement Date/Time: 11/04/22 1254   Serial / Lot #: [id number]  Expiration Date: 05/04/27  Time Out: Correct patient;Correct site;Correct procedure  Maximum sterile barrier precautions: Hand hygiene;Cap;Mask;Sterile gown;Sterile gloves;Large sterile s...  Site Condition No complications  Blue Lumen Status Antimicrobial dead end cap;Heparin  locked  Red Lumen Status Antimicrobial dead end cap;Heparin  locked  Catheter fill solution Heparin  1000 units/ml  Catheter fill volume (Arterial) 1.6 cc  Catheter fill volume (Venous) 1.6  Dressing Type Transparent  Dressing Status Antimicrobial disc/dressing in place;Clean, Dry, Intact  Drainage Description None  Post treatment catheter status Capped and Clamped    "

## 2024-05-01 NOTE — Progress Notes (Signed)
 [facility] NEPHROLOGY PROGRESS NOTE  Assessment/ Plan: Pt is a 83 y.o. yo female  with ESRD on HD, hypertension, hyperlipidemia, CAD, CHF, paroxysmal atrial fibrillation on Eliquis , SSS s/p PM, carotid artery disease, COPD, diabetes mellitus type 2 with polyneuropathy, anemia of chronic disease, hypothyroidism, and OSA on CPAP at night, chronic respiratory failure who is admitted with worsening shortness of breath.   # Acute on chronic respiratory failure. Multifactorial. Pulm edema + pleural effusions on CXR.  Team concerned for possible aspiration pneumonia. - S/p thoracentesis.  - UF w/HD as tolerated - continue torsemide  BID nonoliguric - On antibiotics for coverage of possible aspiration per primary team   #ESRD   - outpatient HD is Mon/Friday.  Here we will dialyze her MWF this week.  I called HD unit and they will bring her in for 2nd shift.  See below - recommend adding a third HD tx weekly - Multiple recent admissions - See she was advised that she would benefit from 3x/week HD - agreed.  I spoke with HD SW and she will [facility] to [facility] to ensure that she has a spot available for three times a week    #Hypertension/volume   - optimize volume with HD/UF  #Anemia of CKD.  She got aranesp  on 12/27 at 100 mcg.  Will order aranesp  100 mcg weekly on Saturdays for now.    # CKD-Metabolic bone disease.  Continue home meds.phos acceptable. Resume hectorol    #Chronic resp failure.  - 3L [place] at baseline - decreased resp reserve and multiple recent admissions - recommend three times a week HD as above   Disposition - HD here today then disposition per primary team      Subjective:  She had 500 mL UOP over 12/28 charted (and 1.4 liters charted the day before that).  Last HD on 12/26 with 2 kg UF.  Spoke with patient and her husband at bedside.  She states that she is willing to go three times a week to HD   Review of systems:  She reports some shortness of breath.   She's on chronic 3.5 liters oxygen  here and at home  Denies chest pain Denies n/v     Objective Vital signs in last 24 hours: Vitals:   05/01/24 0713 05/01/24 0714 05/01/24 0721 05/01/24 1108  BP:   (!) 161/83 (!) 156/76  Pulse:   95 99  Resp:   20 20  Temp:   98.1 F (36.7 C) 98.7 F (37.1 C)  TempSrc:   Oral Oral  SpO2: 97% 97% 94% 97%  Weight:      Height:       Weight change: 0.2 kg  Intake/Output Summary (Last 24 hours) at 05/01/2024 1312 Last data filed at 05/01/2024 0814 Gross per 24 hour  Intake 936 ml  Output 500 ml  Net 436 ml       Labs: RENAL PANEL Recent Labs  Lab 04/27/24 2052 04/27/24 2157 04/28/24 0615 04/29/24 0426 04/30/24 1034 05/01/24 0218  NA 141  --  141 133* 137 137  K 3.8  --  3.6 4.2 4.0 4.1  CL 99  --  101 97* 96* 97*  CO2 28  --  28 24 27 25   GLUCOSE 255*  --  156* 148* 240* 233*  BUN 41*  --  41* 24* 42* 56*  CREATININE 3.16*  --  3.14* 2.09* 3.04* 3.94*  CALCIUM  8.3*  --  7.7* 8.5* 9.0 8.6*  MG  --  1.5* 3.3* 1.9  --  1.9  PHOS  --   --   --   --   --  4.8*  ALBUMIN   --   --   --   --   --  3.1*    Liver Function Tests: Recent Labs  Lab 05/01/24 0218  ALBUMIN  3.1*   No results for input(s): LIPASE, AMYLASE in the last 168 hours. No results for input(s): AMMONIA in the last 168 hours. CBC: Recent Labs    01/19/24 0541 01/20/24 0458 03/24/24 0201 04/27/24 2052 04/28/24 0615 04/29/24 0426 05/01/24 0218  HGB 11.0*   < > 11.2* 10.0* 9.6* 10.0* 9.7*  MCV 96.6   < > 97.2 101.2* 100.9* 100.6* 98.1  VITAMINB12 968*  --   --   --   --   --   --   FOLATE 12.3  --   --   --   --   --   --   FERRITIN 654*  --   --   --   --   --   --   TIBC 249*  --   --   --   --   --   --   IRON 24*  --   --   --   --   --   --   RETICCTPCT 2.1  --   --   --   --   --   --    < > = values in this interval not displayed.    Cardiac Enzymes: No results for input(s): CKTOTAL, CKMB, CKMBINDEX, TROPONINI in the last 168  hours. CBG: Recent Labs  Lab 04/30/24 1126 04/30/24 1555 04/30/24 2205 05/01/24 0609 05/01/24 1105  GLUCAP 229* 299* 279* 178* 240*    Iron Studies: No results for input(s): IRON, TIBC, TRANSFERRIN, FERRITIN in the last 72 hours. Studies/Results: CT CHEST WO CONTRAST Result Date: 04/29/2024 CLINICAL DATA:  Pleural effusion, malignancy suspected. EXAM: CT CHEST WITHOUT CONTRAST TECHNIQUE: Multidetector CT imaging of the chest was performed following the standard protocol without IV contrast. RADIATION DOSE REDUCTION: This exam was performed according to the departmental dose-optimization program which includes automated exposure control, adjustment of the mA and/or kV according to patient size and/or use of iterative reconstruction technique. COMPARISON:  03/19/2024. FINDINGS: Cardiovascular: The heart is enlarged and there is a trace pericardial effusion. Three-vessel coronary artery calcifications are noted. The distal tip of a right internal jugular central venous catheter terminates in the superior vena cava. There is atherosclerotic calcification of the aorta without evidence of aneurysm. The pulmonary trunk is distended suggesting underlying pulmonary artery hypertension. Mediastinum/Nodes: Enlarged lymph nodes are present in the mediastinum measuring up to 1.1 cm in the precarinal space and 1 cm in the subcarinal space. No axillary lymphadenopathy. Evaluation of the hila is limited due to lack of IV contrast. The thyroid  gland is surgically absent. The trachea and esophagus are within normal limits. Lungs/Pleura: Paraseptal and centrilobular emphysematous changes are present in the lungs. There is bilateral basilar consolidation, greater on the right than on the left, increased on the left from the prior exam. There is a small to moderate pleural effusion on the right and trace pleural effusion on the left. No pneumothorax is seen. Upper Abdomen: The gallbladder is surgically absent. No  acute abnormality. Musculoskeletal: Degenerative changes are present in the thoracic spine. Cervical spinal fusion hardware is present. Stable rib fractures are noted bilaterally. No acute osseous abnormality is seen. IMPRESSION: 1. Persistent  bibasilar consolidation, increased on the left and stable on the right. 2. Stable small to moderate right pleural effusion and trace left pleural effusion. 3. Emphysema. 4. Stable bilateral rib fractures. 5. Aortic atherosclerosis and coronary artery calcifications. Electronically Signed   By: Leita Birmingham M.D.   On: 04/29/2024 14:34    Medications: Infusions:  ampicillin -sulbactam (UNASYN ) IV 1.5 g (05/01/24 0521)    Scheduled Medications:  amLODipine   5 mg Oral Daily   apixaban   2.5 mg Oral BID   arformoterol   15 mcg Nebulization BID   atorvastatin   40 mg Oral Daily   benzonatate   100 mg Oral TID   budesonide  (PULMICORT ) nebulizer solution  0.25 mg Nebulization BID   Chlorhexidine  Gluconate Cloth  6 each Topical Q0600   Chlorhexidine  Gluconate Cloth  6 each Topical Q0600   dextromethorphan   15 mg Oral BID   escitalopram   10 mg Oral Daily   guaiFENesin   600 mg Oral BID   insulin  aspart  0-15 Units Subcutaneous TID WC   insulin  aspart  0-5 Units Subcutaneous QHS   insulin  glargine  15 Units Subcutaneous Daily   isosorbide  mononitrate  60 mg Oral Daily   levothyroxine   100 mcg Oral Once per day on Monday Friday   levothyroxine   200 mcg Oral Once per day on Sunday Tuesday Wednesday Thursday Saturday   methylPREDNISolone  (SOLU-MEDROL ) injection  40 mg Intravenous Daily   pantoprazole   40 mg Oral Daily   revefenacin   175 mcg Nebulization Daily   sodium chloride  flush  3 mL Intravenous Q12H   torsemide   100 mg Oral BID    have reviewed scheduled and prn medications.  Physical Exam:    General elderly female in bed in no acute distress HEENT normocephalic atraumatic extraocular movements intact sclera anicteric Neck supple trachea midline Lungs  clear to auscultation bilaterally normal work of breathing at rest on 3.5 liters oxygen   Heart S1S2 no rub; harsh murmur Abdomen soft nontender nondistended Extremities no edema  Neuro - awake and conversant  Psych normal mood and affect Access RIJ tunn catheter in place; LUE AVF with bruit and thrill and with bruising    Outpatient Dialysis Orders:  Unit: [facility] Schedule: Mon/Fri Time: 3:30  EDW: 81.2 kg Flows: 400/800 Bath: 2K/2.5Ca  Access: TDC/L AVF Heparin : None ESA: Mircera 100 q 2 wks (last 12/12) Venofer  100 x 5 (1/5 doses) VDRA: Hectorol  8 q HD   Geena Weinhold C Hinata Diener 05/01/2024,1:46 PM  LOS: 4 days

## 2024-05-01 NOTE — Procedures (Signed)
 HD Note:  Some information was entered later than the data was gathered due to patient care needs. The stated time with the data is accurate.  Received patient in bed to unit.   Alert and oriented.   Informed consent signed and in chart.   Access used: Upper right chest HD catheter Access issues: None  Hand-off given to oncoming dialysis nurse.     Orvetta Danielski L. Lenon, RN Kidney Dialysis Unit.

## 2024-05-01 NOTE — Plan of Care (Signed)

## 2024-05-02 ENCOUNTER — Other Ambulatory Visit (HOSPITAL_COMMUNITY): Payer: Self-pay

## 2024-05-02 DIAGNOSIS — I2723 Pulmonary hypertension due to lung diseases and hypoxia: Secondary | ICD-10-CM | POA: Diagnosis not present

## 2024-05-02 DIAGNOSIS — Z48813 Encounter for surgical aftercare following surgery on the respiratory system: Secondary | ICD-10-CM | POA: Diagnosis not present

## 2024-05-02 DIAGNOSIS — J449 Chronic obstructive pulmonary disease, unspecified: Secondary | ICD-10-CM

## 2024-05-02 DIAGNOSIS — I2722 Pulmonary hypertension due to left heart disease: Secondary | ICD-10-CM | POA: Diagnosis not present

## 2024-05-02 DIAGNOSIS — I5033 Acute on chronic diastolic (congestive) heart failure: Secondary | ICD-10-CM | POA: Diagnosis not present

## 2024-05-02 DIAGNOSIS — J90 Pleural effusion, not elsewhere classified: Secondary | ICD-10-CM | POA: Diagnosis not present

## 2024-05-02 LAB — RENAL FUNCTION PANEL
Albumin: 3.4 g/dL — ABNORMAL LOW (ref 3.5–5.0)
Anion gap: 11 (ref 5–15)
BUN: 28 mg/dL — ABNORMAL HIGH (ref 8–23)
CO2: 30 mmol/L (ref 22–32)
Calcium: 9 mg/dL (ref 8.9–10.3)
Chloride: 98 mmol/L (ref 98–111)
Creatinine, Ser: 2.41 mg/dL — ABNORMAL HIGH (ref 0.44–1.00)
GFR, Estimated: 19 mL/min — ABNORMAL LOW
Glucose, Bld: 150 mg/dL — ABNORMAL HIGH (ref 70–99)
Phosphorus: 3.1 mg/dL (ref 2.5–4.6)
Potassium: 3.9 mmol/L (ref 3.5–5.1)
Sodium: 139 mmol/L (ref 135–145)

## 2024-05-02 LAB — CBC
HCT: 33.5 % — ABNORMAL LOW (ref 36.0–46.0)
Hemoglobin: 10.3 g/dL — ABNORMAL LOW (ref 12.0–15.0)
MCH: 29.9 pg (ref 26.0–34.0)
MCHC: 30.7 g/dL (ref 30.0–36.0)
MCV: 97.4 fL (ref 80.0–100.0)
Platelets: 450 K/uL — ABNORMAL HIGH (ref 150–400)
RBC: 3.44 MIL/uL — ABNORMAL LOW (ref 3.87–5.11)
RDW: 14.8 % (ref 11.5–15.5)
WBC: 13.2 K/uL — ABNORMAL HIGH (ref 4.0–10.5)
nRBC: 0 % (ref 0.0–0.2)

## 2024-05-02 LAB — GLUCOSE, CAPILLARY
Glucose-Capillary: 129 mg/dL — ABNORMAL HIGH (ref 70–99)
Glucose-Capillary: 252 mg/dL — ABNORMAL HIGH (ref 70–99)
Glucose-Capillary: 246 mg/dL — ABNORMAL HIGH (ref 70–99)
Glucose-Capillary: 344 mg/dL — ABNORMAL HIGH (ref 70–99)
Glucose-Capillary: 264 mg/dL — ABNORMAL HIGH (ref 70–99)

## 2024-05-02 LAB — MAGNESIUM: Magnesium: 1.8 mg/dL (ref 1.7–2.4)

## 2024-05-02 MED ORDER — BUSPIRONE HCL 5 MG PO TABS
5.0000 mg | ORAL_TABLET | Freq: Three times a day (TID) | ORAL | Status: DC
  Administered 2024-05-02 – 2024-05-03 (×4): 5 mg via ORAL
  Filled 2024-05-02 (×4): qty 1

## 2024-05-02 MED ORDER — AMOXICILLIN-POT CLAVULANATE 500-125 MG PO TABS
1.0000 | ORAL_TABLET | Freq: Every day | ORAL | Status: DC
  Administered 2024-05-02: 1 via ORAL
  Filled 2024-05-02 (×2): qty 1

## 2024-05-02 MED ORDER — BENZONATATE 100 MG PO CAPS
100.0000 mg | ORAL_CAPSULE | Freq: Three times a day (TID) | ORAL | 0 refills | Status: AC
  Filled 2024-05-02: qty 20, 7d supply, fill #0

## 2024-05-02 MED ORDER — AMOXICILLIN-POT CLAVULANATE 500-125 MG PO TABS
1.0000 | ORAL_TABLET | Freq: Every day | ORAL | 0 refills | Status: AC
  Filled 2024-05-02: qty 2, 2d supply, fill #0

## 2024-05-02 MED ORDER — DEXTROMETHORPHAN POLISTIREX ER 30 MG/5ML PO SUER
15.0000 mg | Freq: Two times a day (BID) | ORAL | 0 refills | Status: AC
  Filled 2024-05-02: qty 89, 18d supply, fill #0

## 2024-05-02 MED ORDER — ALPRAZOLAM 0.25 MG PO TABS
0.2500 mg | ORAL_TABLET | Freq: Three times a day (TID) | ORAL | Status: DC | PRN
  Administered 2024-05-03: 0.25 mg via ORAL
  Filled 2024-05-02: qty 1

## 2024-05-02 MED ORDER — GUAIFENESIN ER 600 MG PO TB12
600.0000 mg | ORAL_TABLET | Freq: Two times a day (BID) | ORAL | 0 refills | Status: AC
  Filled 2024-05-02: qty 60, 30d supply, fill #0

## 2024-05-02 MED ORDER — BUSPIRONE HCL 5 MG PO TABS
5.0000 mg | ORAL_TABLET | Freq: Two times a day (BID) | ORAL | 0 refills | Status: AC
  Filled 2024-05-02: qty 60, 30d supply, fill #0

## 2024-05-02 MED ORDER — PREDNISONE 20 MG PO TABS
50.0000 mg | ORAL_TABLET | Freq: Every day | ORAL | Status: DC
  Administered 2024-05-03: 50 mg via ORAL
  Filled 2024-05-02: qty 1

## 2024-05-02 MED ORDER — CHLORHEXIDINE GLUCONATE CLOTH 2 % EX PADS
6.0000 | MEDICATED_PAD | Freq: Every day | CUTANEOUS | Status: DC
  Administered 2024-05-03: 6 via TOPICAL

## 2024-05-02 MED ORDER — PREDNISONE 10 MG PO TABS
ORAL_TABLET | ORAL | 0 refills | Status: AC
  Filled 2024-05-02: qty 45, 15d supply, fill #0

## 2024-05-02 NOTE — Progress Notes (Signed)
 Mobility Specialist Progress Note:    05/02/24 1419  Mobility  Activity Pivoted/transferred to/from [facility]  Level of Assistance Standby assist, set-up cues, supervision of patient - no hands on  Assistive Device Other (Comment) (HHA)  Distance Ambulated (ft) 3 ft  Range of Motion/Exercises Active  Activity Response Tolerated well  Mobility Referral Yes  Mobility visit 1 Mobility  Mobility Specialist Start Time (ACUTE ONLY) 1419  Mobility Specialist Stop Time (ACUTE ONLY) 1425  Mobility Specialist Time Calculation (min) (ACUTE ONLY) 6 min   Received pt laying in bed requesting assistance to transfer to [facility]. Pt able to move, stand, and ambulate w/ little to no assist. Helped pt return to bed w/ all needs met.   Venetia Keel Mobility Specialist Please Neurosurgeon or Rehab Office at [phone]

## 2024-05-02 NOTE — Progress Notes (Signed)
 /  [name], MRN:  [id number], DOB:  1941-03-25, LOS: 5 ADMISSION DATE:  04/27/2024, CONSULTATION DATE:  05/01/24 REFERRING MD:  Dr Tobie, CHIEF COMPLAINT: Shortness of breath, COPD  History of Present Illness:  83 year old lady who was admitted with shortness of breath.  Febrile history of COPD, chronic respiratory failure, end-stage renal disease on hemodialysis, coronary artery disease, paroxysmal atrial fibrillation.  Came in with worsening shortness of breath noted to have a large right pleural effusion for which she had thoracentesis for about a liter of fluid Continues to complain of shortness of breath Reason for pulmonary consult She is known to me in the outpatient setting  Pertinent  Medical History   Past Medical History:  Diagnosis Date   Allergy    Anxiety    Arthritis    CAD (coronary artery disease)    Mild per remote cath in 2006, /    Nuclear, January, 2013, no significant abnormality,   Carotid artery disease    Doppler, January, 2013, 0 39% RIC A., 40-59% LICA, stable   Cataract    CHF (congestive heart failure) (HCC)    Chronic kidney disease    dialysis Mon Friday   Complication of anesthesia    wakes up mean   COPD 02/16/2007   Intolerant of Spiriva , tudorza Intolerant of Trelegy    COPD (chronic obstructive pulmonary disease) (HCC)    Coronary atherosclerosis 11/24/2008   Catheterization 2006, nonobstructive coronary disease   //   nuclear 2013, low risk     Depression    Diabetes mellitus, type 2 (HCC)    Diverticula of colon    DIVERTICULOSIS OF COLON 03/23/2007   Qualifier: Diagnosis of  By: Shellia MD, Vineet     Dizziness    occasional   Dizzy spells 05/04/2011   DM (diabetes mellitus) (HCC) 03/23/2007   Qualifier: Diagnosis of  By: Shellia MD, Vineet     Dyspnea    On exertion   Ejection fraction    Essential hypertension 02/16/2007   Qualifier: Diagnosis of  By: Nicholaus CMA, Tammy     G E R D 03/23/2007   Qualifier: Diagnosis of   By: Shellia MD, Vineet     Gall bladder disease 04/28/2016   Gastritis and gastroduodenitis    GERD (gastroesophageal reflux disease)    Goiter    hx of   Headache(784.0)    migraine   Hyperlipidemia    Hypertension    Hypothyroidism    HYPOTHYROIDISM, POSTSURGICAL 06/04/2008   Qualifier: Diagnosis of  By: Kassie MD, Sean A    Left ventricular hypertrophy    Leg edema    occasional swelling   Leg swelling 12/09/2018   Myofascial pain syndrome, cervical 01/06/2018   Obesity    OBESITY 11/24/2008   Qualifier: Diagnosis of  By: Jaramillo, Luz     OBSTRUCTIVE SLEEP APNEA 02/16/2007   Auto CPAP 09/03/13 to 10/02/13 >> used on 30 of 30 nights with average 6 hrs and 3 min.  Average AHI is 3.1 with median CPAP 5 cm H2O and 95 th percentile CPAP 6 cm H2O.     Occipital neuralgia of right side 01/06/2018   OSA (obstructive sleep apnea)    uses cpap - setting of 12   Pain in joint of right hip 01/21/2018   Palpitations 01/21/2011   48 hour Holter, 2015, scattered PACs and PVCs.    Paroxysmal atrial fibrillation (HCC)    Pneumonia    PONV (postoperative nausea  and vomiting)    severe after every surgery   Pulmonary hypertension, secondary    RHINITIS 07/21/2010        RUQ abdominal pain    S/P left TKA 07/27/2011   Sinus node dysfunction (HCC) 11/08/2015   Sleep apnea    Spinal stenosis of cervical region 11/07/2014   Urinary frequency 12/09/2018   UTI (urinary tract infection) 08/15/2019   per pt report   VENTRICULAR HYPERTROPHY, LEFT 11/24/2008   Qualifier: Diagnosis of  By: Justina Kos       Significant Hospital Events: Including procedures, antibiotic start and stop dates in addition to other pertinent events   12/25-admitted for shortness of breath 12/26 ultrasound-guided thoracentesis for 1 L of fluid  Interim History / Subjective:  She stated she was able to walk around today Shortness of breath is stable  Objective    Blood pressure (!) 145/63, pulse 98,  temperature 98.1 F (36.7 C), temperature source Oral, resp. rate 18, height 5' 5 (1.651 m), weight 76.2 kg, SpO2 91%.    FiO2 (%):  [28 %] 28 %   Intake/Output Summary (Last 24 hours) at 05/02/2024 1540 Last data filed at 05/02/2024 1254 Gross per 24 hour  Intake 560 ml  Output 3200 ml  Net -2640 ml   Filed Weights   04/30/24 0500 05/01/24 0456 05/02/24 0345  Weight: 77.7 kg 77.9 kg 76.2 kg    Examination: General: Elderly, does not appear to be in distress HENT: Moist oral mucosa Lungs: Decreased air movement bilaterally Cardiovascular: S1-S2 appreciated Abdomen: Soft, bowel sounds appreciated Extremities: No clubbing, no edema Neuro: Awake, alert and oriented, nonfocal GU:   I reviewed last 24 h vitals and pain scores, last 48 h intake and output, last 24 h labs and trends, and last 24 h imaging results.  Echo with moderate pulmonary hypertension, diastolic dysfunction grade 2  No recent pulmonary function tests since 2017 - FEV1 back then was only 1.12  Resolved problem list   Assessment and Plan   Recurrent pleural effusion S/p thoracentesis  Multifactorial shortness of breath from underlying chronic obstructive pulmonary disease which appears severe even as far back as PFT from 2017, moderate pulmonary hypertension, deconditioning  Concern for aspiration pneumonia - Completed antibiotics - Recommended Augmentin   Group 2 and 3 pulmonary hypertension  Advanced COPD - Compliant with Breztri  - Will plan to add Ensifentrine  as outpatient  Continue oxygen  supplementation  Patient can be discharged home  Possible referral to pulmonary rehab also discussed with her - I am not sure she will be able to do this as she states she gets very exhausted on dialysis days - Deconditioning is definitely playing a role in her limitations as well  Jennet Epley, MD [place] Pager: See Tracey

## 2024-05-02 NOTE — Progress Notes (Signed)
 Tech, [place], entered room to take vitals and noticed patient's arm was bleeding. DaShaun, RN came in to help and realized patient had a large skin tear where IV previously was. Primary RN notified. Site cleansed and dressed. Picture taken and added to chart. Care and assessment ongoing.

## 2024-05-02 NOTE — Progress Notes (Signed)
 [facility] Progress Note Patient: Kayla [name] FMW:[id number] DOB: 02/21/1941  DOA: 04/27/2024 DOS: the patient was seen and examined on 05/02/2024  Brief Hospital Course: Patient with PMH of ESRD on HD MF, HLD, HTN, CAD, paroxysmal A-fib, sick sinus syndrome s/p pacemaker implant, COPD, type II DM, neuropathy, hypothyroidism, OSA on CPAP, chronic respiratory failure presented to the hospital with complaints of cough and shortness of breath. Found to have large pleural effusion on the right. Underwent thoracentesis. Assessment and Plan: Acute on chronic hypoxic respiratory failure secondary to recurrent large right pleural effusion. Acute COPD exacerbation secondary to bilateral pneumonia Presents with shortness of breath. Uses 3 LPM at baseline.  Needed 4 LPM upon admission. Chest x-ray shows evidence of large pleural effusion on the right.  Treated with IV Lasix . Underwent IR guided thoracentesis with more than 1 L removed. After Thora patient reported persistent shortness of breath with accessory muscle use. Therefore CT chest was ordered which showed evidence of bilateral infiltrate with possible loculated pleural effusion. Started on IV antibiotic with Unasyn  for aspiration. Also given history of severe COPD initiating treatment for COPD exacerbation given wheezing.  While the patient is on home oxygen  continues to struggle to breathe.  Pulmonary consulted for further assistance. Currently suspect that the patient's current symptom burden is going to be her new normal. Added BuSpar  for anxiety.   Recurrent large right pleural effusion. Etiology is not clear. Pleural fluid culture was not sent. Lights criteria not met as well.  So most likely transudative effusion. But possibility of poor clearance and persistent volume overload secondary to patient's twice a day HD only is high on the differential with heart failure. Discussed with patient that she needs to consider 3  times weekly hemodialysis. Pulmonary consulted.  No further workup recommended for now.  ESRD on HD. Twice a day only Monday and Friday. Patient is recommended to consider 3 times a day HD. Patient will receive hemodialysis tomorrow and then discharge.  Will continue outpatient hemodialysis Monday Wednesday Friday.  Elevated troponin. Mostly in the setting of poor clearance as well as demand ischemia. Monitor for now.   HTN. Takes amlodipine  and Imdur  at home. Blood pressure stable.  Will monitor.   Paroxysmal A-fib. Not on any rate control medication. Currently sinus. Eliquis .   Type II DM.Without long-term insulin  use, with renal dysfunction. A1c 6.2. Currently on sliding scale insulin .   Hypothyroidism. Continue Synthroid .   HLD. Continue statin.   Anemia of chronic disease  Management per nephrology.     Subjective: No nausea no vomiting no fever no chills.  ongoing complaint no shortness of breath.  Physical Exam: Bilateral basal crackles. Wheezes have resolved today. S1-S2 present No thrush. Bowel sounds present No edema.  Data Reviewed: I have Reviewed nursing notes, Vitals, and Lab results. Since last encounter, pertinent lab results CBC and BMP   . I have ordered test including CBC and BMP  . I have discussed pt's care plan and test results with pulmonary and nephrology  .   Disposition: Status is: Inpatient Remains inpatient appropriate because: Monitor for improvement in shortness of breath  apixaban  (ELIQUIS ) tablet 2.5 mg Start: 04/27/24 2200 SCDs Start: 04/27/24 2145 apixaban  (ELIQUIS ) tablet 2.5 mg   Family Communication: Family at bedside Level of care: Telemetry   Vitals:   05/02/24 0742 05/02/24 0814 05/02/24 1133 05/02/24 1644  BP: 139/79  (!) 145/63 (!) 151/69  Pulse: 91 88 98 100  Resp: 20 20 18 20   Temp: 97.6  F (36.4 C)  98.1 F (36.7 C) 98.1 F (36.7 C)  TempSrc: Oral  Oral Oral  SpO2: 95%  91% 95%  Weight:      Height:          Author: Yetta Blanch, MD 05/02/2024 6:43 PM  Please look on [email or web address] to find out who is on call.

## 2024-05-02 NOTE — Progress Notes (Signed)
 Physical Therapy Treatment Patient Details Name: Kayla [name] MRN: [id number] DOB: 16-Apr-1941 Today's Date: 05/02/2024   History of Present Illness 83 y.o. female presents to [facility] 04/27/24 acute on chronic hypoxic respiratory failure 2/2 recurrent large R pleural effusion. 12/26 s/p thoracentesis. PMHx: ESRD on HD, hypertension, hyperlipidemia, CAD, CHF, paroxysmal atrial fibrillation on Eliquis , SSS s/p PM, carotid artery disease, COPD, diabetes mellitus type 2 with polyneuropathy, anemia of chronic disease, hypothyroidism, and OSA on CPAP    PT Comments  Pt greeted supine in bed, pleasant and agreeable to PT session. She advanced OOB mobility ambulating into the hallway using RW with CGA and a chair follow. Cued PLB technique throughout. Pt experienced SOB and DOE 2/4. She took one seated rest break before returning to her room. Pt declined further ambulation or exercises. Encouraged her to sit up in recliner chair for all meals, mobilize into the bathroom for bowel movements, and complete frequent short-distance ambulation with staff assistance. Recommend HHPT to increase strength, advance activity tolerance, educate on energy conservation techniques, improve balance, decrease fall risk, and optimize safety within the home environment.      If plan is discharge home, recommend the following: Assist for transportation;Help with stairs or ramp for entrance;Assistance with cooking/housework;A little help with walking and/or transfers;A little help with bathing/dressing/bathroom   Can travel by private vehicle        Equipment Recommendations  None recommended by PT    Recommendations for Other Services       Precautions / Restrictions Precautions Precautions: Fall Recall of Precautions/Restrictions: Intact Restrictions Weight Bearing Restrictions Per Provider Order: No     Mobility  Bed Mobility Overal bed mobility: Modified Independent             General bed mobility  comments: Pt sat up on R side of bed with HOB slightly elevated and increased time. No physical assist or cues for sequencing.    Transfers Overall transfer level: Needs assistance Equipment used: Rolling walker (2 wheels) Transfers: Sit to/from Stand Sit to Stand: Contact guard assist           General transfer comment: Pt stood from lowest bed height and chair. She demonstrated proper hand placement using RW. Powered up with light assist. Good eccentric control.    Ambulation/Gait Ambulation/Gait assistance: Contact guard assist, +2 safety/equipment (chair follow) Gait Distance (Feet): 60 Feet (x2) Assistive device: Rolling walker (2 wheels) Gait Pattern/deviations: Step-through pattern, Decreased stride length Gait velocity: reduced Gait velocity interpretation: <1.31 ft/sec, indicative of household ambulator   General Gait Details: Pt ambulated with a reciprocal gait pattern, even weight shift, and good foot clearence. She maintained upright posture with good proximity to RW. Pt navigated room/hallway well, no LOB. 1 seated rest break. Cued PLB technique throughout.   Stairs             Wheelchair Mobility     Tilt Bed    Modified Rankin (Stroke Patients Only)       Balance Overall balance assessment: Needs assistance Sitting-balance support: No upper extremity supported, Feet supported Sitting balance-Leahy Scale: Good     Standing balance support: Bilateral upper extremity supported, During functional activity, Reliant on assistive device for balance Standing balance-Leahy Scale: Poor Standing balance comment: Pt dependent on RW                            Communication Communication Communication: Impaired Factors Affecting Communication: Hearing impaired  Cognition Arousal:  Alert Behavior During Therapy: WFL for tasks assessed/performed   PT - Cognitive impairments: No apparent impairments                         Following  commands: Intact      Cueing Cueing Techniques: Verbal cues  Exercises      General Comments General comments (skin integrity, edema, etc.): VSS on 3L O2 via [place]. Encouraged pt to sit up in recliner chair for all meals.      Pertinent Vitals/Pain Pain Assessment Pain Assessment: No/denies pain    Home Living Family/patient expects to be discharged to:: Private residence Living Arrangements: Alone                      Prior Function            PT Goals (current goals can now be found in the care plan section) Acute Rehab PT Goals Patient Stated Goal: Return Home PT Goal Formulation: With patient/family Time For Goal Achievement: 05/13/24 Potential to Achieve Goals: Good Progress towards PT goals: Progressing toward goals    Frequency    Min 2X/week      PT Plan      Co-evaluation              AM-PAC PT 6 Clicks Mobility   Outcome Measure  Help needed turning from your back to your side while in a flat bed without using bedrails?: A Little Help needed moving from lying on your back to sitting on the side of a flat bed without using bedrails?: A Little Help needed moving to and from a bed to a chair (including a wheelchair)?: A Little Help needed standing up from a chair using your arms (e.g., wheelchair or bedside chair)?: A Little Help needed to walk in hospital room?: A Little Help needed climbing 3-5 steps with a railing? : A Lot 6 Click Score: 17    End of Session Equipment Utilized During Treatment: Gait belt;Oxygen  Activity Tolerance: Patient tolerated treatment well Patient left: in bed;with call bell/phone within reach;with bed alarm set;with family/visitor present Nurse Communication: Mobility status PT Visit Diagnosis: Other abnormalities of gait and mobility (R26.89);Muscle weakness (generalized) (M62.81)     Time: 8857-8842 PT Time Calculation (min) (ACUTE ONLY): 15 min  Charges:    $Gait Training: 8-22 mins PT General  Charges $$ ACUTE PT VISIT: 1 Visit                     Kayla [name], PT, DPT Acute Rehabilitation Services Office: [phone] Secure Chat Preferred  Kayla [name] 05/02/2024, 1:39 PM
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
## 2024-05-02 NOTE — Progress Notes (Signed)
 Contacted by nephrologist with request for pt to receive out-pt HD treatment at [facility] tomorrow. Also of note, pt will need to be changed to MWF at d/c as well. Contacted [facility] and spoke to tax adviser. Navigator advised that clinic does not have availability for tomorrow and pt can resume on Friday (pt's normal day). Also, clinic manager will have to contact clinic medical director to advise her of recommendation for 3x's a week HD at d/c and receive approval from MD for pt to receive 3x's a week treatment at d/c. Navigator requested that clinic staff please provide update to navigator as to if approval can be received for 3x's a week so update can be provided to nephrologist rounding this week. Update provided to nephrologist. Will assist as needed.   Randine Mungo Dialysis Navigator (coverage) [phone]

## 2024-05-03 ENCOUNTER — Telehealth: Payer: Self-pay | Admitting: *Deleted

## 2024-05-03 ENCOUNTER — Other Ambulatory Visit (HOSPITAL_COMMUNITY): Payer: Self-pay

## 2024-05-03 LAB — CBC
HCT: 34 % — ABNORMAL LOW (ref 36.0–46.0)
Hemoglobin: 10.5 g/dL — ABNORMAL LOW (ref 12.0–15.0)
MCH: 29.7 pg (ref 26.0–34.0)
MCHC: 30.9 g/dL (ref 30.0–36.0)
MCV: 96 fL (ref 80.0–100.0)
Platelets: 447 K/uL — ABNORMAL HIGH (ref 150–400)
RBC: 3.54 MIL/uL — ABNORMAL LOW (ref 3.87–5.11)
RDW: 14.7 % (ref 11.5–15.5)
WBC: 14.5 K/uL — ABNORMAL HIGH (ref 4.0–10.5)
nRBC: 0 % (ref 0.0–0.2)

## 2024-05-03 LAB — RENAL FUNCTION PANEL
Albumin: 3.4 g/dL — ABNORMAL LOW (ref 3.5–5.0)
Anion gap: 13 (ref 5–15)
BUN: 50 mg/dL — ABNORMAL HIGH (ref 8–23)
CO2: 28 mmol/L (ref 22–32)
Calcium: 9.2 mg/dL (ref 8.9–10.3)
Chloride: 99 mmol/L (ref 98–111)
Creatinine, Ser: 3.5 mg/dL — ABNORMAL HIGH (ref 0.44–1.00)
GFR, Estimated: 12 mL/min — ABNORMAL LOW
Glucose, Bld: 201 mg/dL — ABNORMAL HIGH (ref 70–99)
Phosphorus: 3.5 mg/dL (ref 2.5–4.6)
Potassium: 3.8 mmol/L (ref 3.5–5.1)
Sodium: 140 mmol/L (ref 135–145)

## 2024-05-03 LAB — GLUCOSE, CAPILLARY
Glucose-Capillary: 196 mg/dL — ABNORMAL HIGH (ref 70–99)
Glucose-Capillary: 176 mg/dL — ABNORMAL HIGH (ref 70–99)

## 2024-05-03 LAB — MAGNESIUM: Magnesium: 1.8 mg/dL (ref 1.7–2.4)

## 2024-05-03 MED ORDER — HEPARIN SODIUM (PORCINE) 1000 UNIT/ML DIALYSIS: 1000.0000 [IU] | INTRAMUSCULAR | Status: DC | PRN

## 2024-05-03 MED ORDER — HEPARIN SODIUM (PORCINE) 1000 UNIT/ML DIALYSIS: 20.0000 [IU]/kg | INTRAMUSCULAR | Status: DC | PRN

## 2024-05-03 MED ORDER — PENTAFLUOROPROP-TETRAFLUOROETH EX AERO: 1.0000 | INHALATION_SPRAY | CUTANEOUS | Status: DC | PRN

## 2024-05-03 MED ORDER — ALTEPLASE 2 MG IJ SOLR: 2.0000 mg | Freq: Once | INTRAMUSCULAR | Status: DC | PRN

## 2024-05-03 MED ORDER — LIDOCAINE-PRILOCAINE 2.5-2.5 % EX CREA: 1.0000 | TOPICAL_CREAM | CUTANEOUS | Status: DC | PRN

## 2024-05-03 MED ORDER — HEPARIN SODIUM (PORCINE) 1000 UNIT/ML IJ SOLN
INTRAMUSCULAR | Status: AC
  Filled 2024-05-03: qty 4

## 2024-05-03 MED ORDER — ANTICOAGULANT SODIUM CITRATE 4% (200MG/5ML) IV SOLN: 5.0000 mL | Status: DC | PRN

## 2024-05-03 MED ORDER — LIDOCAINE HCL (PF) 1 % IJ SOLN: 5.0000 mL | INTRAMUSCULAR | Status: DC | PRN

## 2024-05-03 MED ORDER — ACETAMINOPHEN 325 MG PO TABS
ORAL_TABLET | ORAL | Status: AC
  Filled 2024-05-03: qty 2

## 2024-05-03 NOTE — Progress Notes (Signed)
" °   05/03/24 1051  Vitals  Temp 97.8 F (36.6 C)  Temp Source Oral  BP 128/89  Pulse Rate 89  ECG Heart Rate 92  Resp 18  Weight 74.8 kg  Type of Weight Post-Dialysis  Oxygen  Therapy  SpO2 98 %  O2 Device Nasal Cannula  O2 Flow Rate (L/min) 3 L/min  Pulse Oximetry Type Continuous  During Treatment Monitoring  Blood Flow Rate (mL/min) 399 mL/min  Arterial Pressure (mmHg) -211.91 mmHg  Venous Pressure (mmHg) 221 mmHg  TMP (mmHg) 12.73 mmHg  Ultrafiltration Rate (mL/min) 1942 mL/min  Dialysate Flow Rate (mL/min) 299 ml/min  Dialysate Potassium Concentration 2  Dialysate Calcium  Concentration 2.5  Duration of HD Treatment -hour(s) 3.47 hour(s)  Cumulative Fluid Removed (mL) per Treatment  2388.64  HD Safety Checks Performed Yes  Intra-Hemodialysis Comments Tx completed  Post Treatment  Liters Processed 83.9  Fluid Removed (mL) 2400 mL  Tolerated HD Treatment Yes  Hemodialysis Catheter Right Internal jugular Double lumen Permanent (Tunneled)  Placement Date/Time: 11/04/22 1254   Serial / Lot #: [id number]  Expiration Date: 05/04/27  Time Out: Correct patient;Correct site;Correct procedure  Maximum sterile barrier precautions: Hand hygiene;Cap;Mask;Sterile gown;Sterile gloves;Large sterile s...  Site Condition No complications  Blue Lumen Status Antimicrobial dead end cap;Heparin  locked  Red Lumen Status Antimicrobial dead end cap;Heparin  locked  Catheter fill solution Heparin  1000 units/ml  Catheter fill volume (Arterial) 1.6 cc  Catheter fill volume (Venous) 1.6  Dressing Type Transparent  Dressing Status Clean, Dry, Intact;Antimicrobial disc/dressing in place  Drainage Description None  Post treatment catheter status Capped and Clamped    "

## 2024-05-03 NOTE — Discharge Planning (Signed)
 [facility] Patient Discharge Orders - [facility] CLINIC: [facility]  Patient's name: Kayla [name] Admit/DC Dates: 04/27/2024 - 05/03/2024  DISCHARGE DIAGNOSES: AHRF/pulm edema/pleural effusions - s/p thoracentesis + HD  ?aspiration pneumonia - s/p abx   HD ORDER CHANGES: Heparin  change: no EDW Change: YES New EDW: 75kg Bath Change: YES  - 3K/2.5Ca  ANEMIA MANAGEMENT: Aranesp : Given: yes   Amount/Date of last dose: 100mcg on 12/27 ESA dose for discharge: mircera 100 mcg IV q 2 weeks, to start on 1/3 IV Iron dose at discharge: per protocol Transfusion: Given: no  BONE/MINERAL MEDICATIONS: Hectorol /Calcitriol change: no Sensipar/Parsabiv change: no  ACCESS INTERVENTION/CHANGE: no Details:   RECENT LABS: Recent Labs  Lab 05/03/24 0355  HGB 10.5*  NA 140  K 3.8  CALCIUM  9.2  PHOS 3.5  ALBUMIN  3.4*    IV ANTIBIOTICS: no Details: will be finishing course of PO Augmentin   OTHER ANTICOAGULATION: no Details:  OTHER/APPTS/LAB ORDERS: - CHANGING TO 3d/week HD MOVING FORWARD - MWF - Please check K weekly while on 3K  D/C Meds to be reconciled by nurse after every discharge.  Completed By: Izetta Boehringer, PA-C [facility] Pager [phone]   Reviewed by: MD:______ RN_______

## 2024-05-03 NOTE — Progress Notes (Signed)
 OT Cancellation Note  Patient Details Name: Kayla [name] MRN: [id number] DOB: Nov 12, 1940   Cancelled Treatment:    Reason Eval/Treat Not Completed: Patient at procedure or test/ unavailable (HD)  Maryln Eastham K, OTD, OTR/L SecureChat Preferred Acute Rehab [phone]   Laneta MARLA Pereyra 05/03/2024, 7:06 AM

## 2024-05-03 NOTE — Progress Notes (Addendum)
 D/c orders noted. Contacted [facility] [place] and informed pt will likely d/c today after HD and return Friday per schedule.   Requested update on MD approval for the 3x week at out-pt HD clinic, will advise on feedback given.   Lexia Vandevender Dialysis Navigator [phone]  Addendum 1257 Per clinic manager, new schedule for MWF in place. No further support needed at this time.

## 2024-05-03 NOTE — Progress Notes (Signed)
 Occupational Therapy Treatment Patient Details Name: Kayla [name] MRN: [id number] DOB: 04/29/1941 Today's Date: 05/03/2024   History of present illness 83 y.o. female presents to [facility] 04/27/24 acute on chronic hypoxic respiratory failure 2/2 recurrent large R pleural effusion. 12/26 s/p thoracentesis. PMHx: ESRD on HD, hypertension, hyperlipidemia, CAD, CHF, paroxysmal atrial fibrillation on Eliquis , SSS s/p PM, carotid artery disease, COPD, diabetes mellitus type 2 with polyneuropathy, anemia of chronic disease, hypothyroidism, and OSA on CPAP   OT comments  Pt progressing toward goals this session, currently needing set up -min A for ADLs, mod I for bed mobility, and CGA for transfers with RW. Pt needing x1 seated rest during dressing task, but SpO2 mid 90s on 3L O2. Discussed activity pacing strategies on days pt has HD. Pt presenting with impairments listed below, will follow acutely. Continue to recommend HHOT at d/c.       If plan is discharge home, recommend the following:  A little help with walking and/or transfers;A little help with bathing/dressing/bathroom;Assistance with cooking/housework;Assist for transportation   Equipment Recommendations  None recommended by OT    Recommendations for Other Services PT consult    Precautions / Restrictions Precautions Precautions: Fall Recall of Precautions/Restrictions: Intact Restrictions Weight Bearing Restrictions Per Provider Order: No       Mobility Bed Mobility Overal bed mobility: Modified Independent                  Transfers Overall transfer level: Needs assistance Equipment used: Rolling walker (2 wheels) Transfers: Sit to/from Stand Sit to Stand: Contact guard assist                 Balance Overall balance assessment: Needs assistance Sitting-balance support: No upper extremity supported, Feet supported Sitting balance-Leahy Scale: Good     Standing balance support: Bilateral upper extremity  supported, During functional activity, Reliant on assistive device for balance Standing balance-Leahy Scale: Poor Standing balance comment: Pt dependent on RW                           ADL either performed or assessed with clinical judgement   ADL Overall ADL's : Needs assistance/impaired                 Upper Body Dressing : Set up;Sitting Upper Body Dressing Details (indicate cue type and reason): donning shirt Lower Body Dressing: Minimal assistance;Sitting/lateral leans Lower Body Dressing Details (indicate cue type and reason): donning pants and undergarments Toilet Transfer: Contact guard assist           Functional mobility during ADLs: Contact guard assist;Rolling walker (2 wheels)      Extremity/Trunk Assessment Upper Extremity Assessment Upper Extremity Assessment: Overall WFL for tasks assessed   Lower Extremity Assessment Lower Extremity Assessment: Defer to PT evaluation        Vision   Vision Assessment?: No apparent visual deficits   Perception Perception Perception: Not tested   Praxis Praxis Praxis: Not tested   Communication Communication Communication: Impaired Factors Affecting Communication: Hearing impaired   Cognition Arousal: Alert Behavior During Therapy: WFL for tasks assessed/performed Cognition: No apparent impairments                               Following commands: Intact        Cueing   Cueing Techniques: Verbal cues  Exercises      Shoulder Instructions  General Comments VSS on 3L O2    Pertinent Vitals/ Pain       Pain Assessment Pain Assessment: No/denies pain  Home Living                                          Prior Functioning/Environment              Frequency  Min 2X/week        Progress Toward Goals  OT Goals(current goals can now be found in the care plan section)  Progress towards OT goals: Progressing toward goals  Acute Rehab OT  Goals Patient Stated Goal: none stated OT Goal Formulation: With patient Time For Goal Achievement: 05/14/24 Potential to Achieve Goals: Good ADL Goals Pt Will Perform Grooming: with modified independence;standing Pt Will Perform Lower Body Dressing: with modified independence;sit to/from stand Pt Will Transfer to Toilet: with modified independence;ambulating;regular height toilet Pt Will Perform Toileting - Clothing Manipulation and hygiene: with modified independence;sit to/from stand Additional ADL Goal #1: Pt will tolerate 10 minutes of standing activity to increase activity tolerance for ADLs  Plan      Co-evaluation                 AM-PAC OT 6 Clicks Daily Activity     Outcome Measure   Help from another person eating meals?: None Help from another person taking care of personal grooming?: A Little Help from another person toileting, which includes using toliet, bedpan, or urinal?: A Little Help from another person bathing (including washing, rinsing, drying)?: A Little Help from another person to put on and taking off regular upper body clothing?: A Little Help from another person to put on and taking off regular lower body clothing?: A Little 6 Click Score: 19    End of Session Equipment Utilized During Treatment: Rolling walker (2 wheels);Oxygen   OT Visit Diagnosis: Unsteadiness on feet (R26.81);Other abnormalities of gait and mobility (R26.89);Muscle weakness (generalized) (M62.81)   Activity Tolerance Patient limited by fatigue   Patient Left in bed;with call bell/phone within reach;with family/visitor present   Nurse Communication Mobility status        Time: 1257-1316 OT Time Calculation (min): 19 min  Charges: OT General Charges $OT Visit: 1 Visit OT Treatments $Self Care/Home Management : 8-22 mins  Skyler Carel K, OTD, OTR/L SecureChat Preferred Acute Rehab [phone]   Brycen Bean K Koonce 05/03/2024, 1:28 PM

## 2024-05-03 NOTE — Inpatient Diabetes Management (Signed)
 Inpatient Diabetes Program Recommendations  AACE/ADA: New Consensus Statement on Inpatient Glycemic Control (2015)  Target Ranges:  Prepandial:   less than 140 mg/dL      Peak postprandial:   less than 180 mg/dL (1-2 hours)      Critically ill patients:  140 - 180 mg/dL   Lab Results  Component Value Date   GLUCAP 196 (H) 05/03/2024   HGBA1C 6.2 (H) 03/11/2024    Review of Glycemic Control  Latest Reference Range & Units 05/02/24 08:15 05/02/24 11:35 05/02/24 16:03 05/02/24 21:10 05/03/24 05:57  Glucose-Capillary 70 - 99 mg/dL 747 (H) 753 (H) 655 (H) 264 (H) 196 (H)   Diabetes history: DM 2 Outpatient Diabetes medications:  Humalog  0-9 units tid with meals Lantus  15 units daily Current orders for Inpatient glycemic control:  Novolog  0-15 units tid with meals and HS Lantus  15 units daily PO prednisone  50 mg Daily  Inpatient Diabetes Program Recommendations:    -   Please consider adding Novolog  3 units tid with meals (meal coverage-hold if patient eats less than 50% or NPO).   Clotilda Bull RN, MSN, BC-ADM Inpatient Diabetes Coordinator Team Pager [phone] (8a-5p)
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
## 2024-05-03 NOTE — Discharge Summary (Addendum)
 Physician Discharge Summary  Kayla [name] FMW:[id number] DOB: [birth year] DOA: 04/27/2024  PCP: Stephanie Charlene CROME, MD  Admit date: 04/27/2024  Discharge date: 05/03/2024  Admitted From: Home  Disposition:  Home Health Services.  Recommendations for Outpatient Follow-up:  Follow up with PCP in 1-2 weeks. Please obtain BMP/CBC in one week. Advised to follow-up with pulmonology as scheduled. Advised to take Augmentin  twice a day for 2 more days for aspiration pneumonia. Advised to continue prednisone  taper as prescribed. Advised to continue hemodialysis as an outpatient.  Home Health:Home PT/OT Equipment/Devices:Home oxygen  @ 3L/min  Discharge Condition: Stable CODE STATUS:DNR Diet recommendation: Heart Healthy  Brief Summary/ Hospital Course: Patient with PMH of ESRD on HD MF, HLD, HTN, CAD, paroxysmal A-fib, sick sinus syndrome s/p pacemaker implant, COPD, type II DM, neuropathy, hypothyroidism, OSA on CPAP, chronic respiratory failure presented to the hospital with complaints of cough and shortness of breath. Found to have large pleural effusion on the right. She Underwent thoracentesis.  Has felt significantly improved.  Patient continued to remain hypoxic.  Pulmonology is consulted given history of severe COPD.  Pulmonology states patient's current symptom burden is going to be her new normal.  No further workup recommended at this time.  Patient wants to be discharged after hemodialysis.  Feels much improved.  Discharge Diagnoses:  Principal Problem:   CHF (congestive heart failure) (HCC)  Acute on chronic hypoxic respiratory failure secondary to recurrent large right pleural effusion. Acute COPD exacerbation secondary to bilateral pneumonia Presents with shortness of breath. Uses 3 LPM at baseline.  Needed 4 LPM upon admission. Chest x-ray shows evidence of large pleural effusion on the right.  Treated with IV Lasix . Underwent IR guided thoracentesis with more than 1 L  removed. After Thora patient reported persistent shortness of breath with accessory muscle use. Therefore CT chest was ordered which showed evidence of bilateral infiltrate with possible loculated pleural effusion. Started on IV antibiotic with Unasyn  for aspiration. Also given history of severe COPD initiating treatment for COPD exacerbation given wheezing.  While the patient is on home oxygen  continues to struggle to breathe.  Pulmonary consulted for further assistance. Currently suspect that the patient's current symptom burden is going to be her new normal. Added BuSpar  for anxiety. Feels much better.  Wants to be discharged.   Recurrent large right pleural effusion. Etiology is not clear. Pleural fluid culture was not sent. Lights criteria not met as well.  So most likely transudative effusion. But possibility of poor clearance and persistent volume overload secondary to patient's twice a day HD only is high on the differential with heart failure. Discussed with patient that she needs to consider 3 times weekly hemodialysis. Pulmonary consulted.  No further workup recommended for now.   ESRD on HD. Twice a day only Monday and Friday. Patient is recommended to consider 3 times a day HD. Patient will receive hemodialysis tomorrow and then discharge.  Will continue outpatient hemodialysis Monday Wednesday Friday.   Elevated troponin. Mostly in the setting of poor clearance as well as demand ischemia. Monitor for now.   HTN. Takes amlodipine  and Imdur  at home. Blood pressure stable.  Will monitor.   Paroxysmal A-fib. Not on any rate control medication. Currently sinus. HR controlled. Eliquis .   Type II DM.Without long-term insulin  use, with renal dysfunction. A1c 6.2. Currently on sliding scale insulin .   Hypothyroidism. Continue Synthroid .   HLD. Continue statin.   Anemia of chronic disease  Management per nephrology.     Discharge Instructions  Discharge  Instructions     Call MD for:  difficulty breathing, headache or visual disturbances   Complete by: As directed    Call MD for:  persistant nausea and vomiting   Complete by: As directed    Discharge instructions   Complete by: As directed    Advised to follow up PCP in one week. Advised to follow-up with pulmonology as scheduled. Advised to take Augmentin  twice a day for 2 more days for aspiration pneumonia. Advised to continue prednisone  taper as prescribed. Advised to continue hemodialysis as an outpatient.   Increase activity slowly   Complete by: As directed    Increase activity slowly   Complete by: As directed    No wound care   Complete by: As directed    No wound care   Complete by: As directed       Allergies as of 05/03/2024       Reactions   Tape Other (See Comments)   SKIN IS VERY THIN- TEARS EASILY!!   Oxycodone  Itching, Other (See Comments)   Pt still takes this med even thought it causes itching   Trelegy Ellipta  [fluticasone -umeclidin-vilant] Nausea And Vomiting, Other (See Comments), Cough   Made the patient sick to her stomach (only) several days after using it    Trelegy Ellipta  [fluticasone -umeclidin-vilant] Nausea And Vomiting   Trulicity [dulaglutide] Nausea And Vomiting, Nausea Only, Other (See Comments), Cough   TRULICITY made the patient sick to her stomach (only) several days after using it   Trulicity [dulaglutide] Nausea And Vomiting        Medication List     TAKE these medications    albuterol  108 (90 Base) MCG/ACT inhaler Commonly known as: VENTOLIN  HFA Inhale 2 puffs into the lungs every 6 (six) hours as needed for wheezing or shortness of breath.   albuterol  (2.5 MG/3ML) 0.083% nebulizer solution Commonly known as: PROVENTIL  Take 3 mLs (2.5 mg total) by nebulization every 2 (two) hours as needed for wheezing or shortness of breath.   amLODipine  5 MG tablet Commonly known as: NORVASC  Take 5 mg by mouth in the morning.    amoxicillin -clavulanate 500-125 MG tablet Commonly known as: Augmentin  Take 1 tablet by mouth daily for 2 days.   apixaban  2.5 MG Tabs tablet Commonly known as: ELIQUIS  Take 2.5 mg by mouth 2 (two) times daily.   ascorbic acid  1000 MG tablet Commonly known as: VITAMIN C  Take 1,000 mg by mouth daily with lunch.   atorvastatin  40 MG tablet Commonly known as: LIPITOR TAKE 1 TABLET BY MOUTH DAILY   benzonatate  100 MG capsule Commonly known as: TESSALON  Take 1 capsule (100 mg total) by mouth 3 (three) times daily.   Breztri  Aerosphere 160-9-4.8 MCG/ACT Aero inhaler Generic drug: budesonide -glycopyrrolate -formoterol  Inhale 2 puffs into the lungs in the morning and at bedtime.   busPIRone  5 MG tablet Commonly known as: BUSPAR  Take 1 tablet (5 mg total) by mouth 2 (two) times daily.   cyanocobalamin  1000 MCG tablet Commonly known as: VITAMIN B12 Take 1,000 mcg by mouth daily.   dextromethorphan  30 MG/5ML liquid Commonly known as: DELSYM  Take 2.5 mLs (15 mg total) by mouth 2 (two) times daily.   docusate sodium  100 MG capsule Commonly known as: COLACE Take 1 capsule (100 mg total) by mouth 2 (two) times daily as needed for mild constipation or moderate constipation.   escitalopram  10 MG tablet Commonly known as: LEXAPRO  Take 10 mg by mouth daily.   fluticasone  50 MCG/ACT nasal spray  Commonly known as: FLONASE  Place 1 spray into both nostrils 2 (two) times daily as needed for allergies or rhinitis.   guaiFENesin  600 MG 12 hr tablet Commonly known as: MUCINEX  Take 1 tablet (600 mg total) by mouth 2 (two) times daily.   HumaLOG  KwikPen 100 UNIT/ML KwikPen Generic drug: insulin  lispro Inject 0-9 Units into the skin: Sliding scale insulin  Less than 70 initiate hypoglycemia protocol;  70-120  0 units, 120-150 1 unit; 151-200 2 units; 201-250 3 units; 251-300 5 units;  301-350 7 units; 351-400 9 units. Greater than 400 call MD   HYDROcodone -acetaminophen  7.5-325 MG  tablet Commonly known as: NORCO Take 1 tablet by mouth every 6 (six) hours as needed for moderate pain (pain score 4-6).   isosorbide  mononitrate 60 MG 24 hr tablet Commonly known as: IMDUR  Take 1 tablet (60 mg total) by mouth daily.   Lantus  SoloStar 100 UNIT/ML Solostar Pen Generic drug: insulin  glargine Inject 15 Units into the skin daily before breakfast.   levothyroxine  200 MCG tablet Commonly known as: SYNTHROID  Take 100-200 mcg by mouth See admin instructions. Take 1/2 tablet by mouth every Monday and Friday, then take 1 tablet all other days   nitroGLYCERIN  0.4 MG SL tablet Commonly known as: NITROSTAT  Place 1 tablet (0.4 mg total) under the tongue every 5 (five) minutes as needed for chest pain.   omeprazole  20 MG tablet Commonly known as: PRILOSEC OTC Take 20 mg by mouth daily at 2 PM.   OXYGEN  Inhale 3 L into the lungs continuous.   Polyethyl Glycol-Propyl Glycol 0.4-0.3 % Soln Place 1 drop into both eyes as needed (dry eyes, eye irritations.).   predniSONE  10 MG tablet Commonly known as: DELTASONE  Take 5 tablets (50 mg total) by mouth daily for 3 days, THEN 4 tablets (40 mg total) daily for 3 days, THEN 3 tablets (30 mg total) daily for 3 days, THEN 2 tablets (20 mg total) daily for 3 days, THEN 1 tablet (10 mg total) daily for 3 days. Start taking on: May 02, 2024   sevelamer  carbonate 800 MG tablet Commonly known as: RENVELA  Take 800 mg by mouth 3 (three) times daily with meals.   torsemide  100 MG tablet Commonly known as: DEMADEX  Take 1 tablet (100 mg total) by mouth 2 (two) times daily. What changed:  when to take this additional instructions   Vitamin D3 25 MCG (1000 UT) Caps Take 1,000 Units by mouth daily.        Contact information for follow-up providers     Hamrick, Maura L, MD. Schedule an appointment as soon as possible for a visit in 1 week(s).   Specialty: Family Medicine Contact information: [address] [place]  [address] [phone]         Neda Jennet LABOR, MD. Schedule an appointment as soon as possible for a visit in 2 week(s).   Specialty: Pulmonary Disease Contact information: [address] [place] [address] [phone]              Contact information for after-discharge care     Home Medical Care     [facility]) .   Service: Home Health Services Contact information: [address] [place]   [address] [phone]                    Allergies[1]  Consultations: Pulmonology   Procedures/Studies: CT CHEST WO CONTRAST Result Date: 04/29/2024 CLINICAL DATA:  Pleural effusion, malignancy suspected. EXAM: CT CHEST WITHOUT CONTRAST TECHNIQUE: Multidetector CT imaging of the chest was performed following the standard protocol without IV contrast. RADIATION DOSE REDUCTION: This exam was performed according to the departmental dose-optimization program which includes automated exposure control, adjustment of the mA and/or kV according to patient size and/or use of iterative reconstruction technique. COMPARISON:  03/19/2024. FINDINGS: Cardiovascular: The heart is enlarged and there is a trace pericardial effusion. Three-vessel coronary artery calcifications are noted. The distal tip of a right internal jugular central venous catheter terminates in the superior vena cava. There is atherosclerotic calcification of the aorta without evidence of aneurysm. The pulmonary trunk is distended suggesting underlying pulmonary artery hypertension. Mediastinum/Nodes: Enlarged lymph nodes are present in the mediastinum measuring up to 1.1 cm in the precarinal space and 1 cm in the subcarinal space. No axillary lymphadenopathy. Evaluation of the hila is limited due to lack of IV contrast. The thyroid  gland is surgically absent. The trachea and esophagus are within normal limits. Lungs/Pleura: Paraseptal and centrilobular  emphysematous changes are present in the lungs. There is bilateral basilar consolidation, greater on the right than on the left, increased on the left from the prior exam. There is a small to moderate pleural effusion on the right and trace pleural effusion on the left. No pneumothorax is seen. Upper Abdomen: The gallbladder is surgically absent. No acute abnormality. Musculoskeletal: Degenerative changes are present in the thoracic spine. Cervical spinal fusion hardware is present. Stable rib fractures are noted bilaterally. No acute osseous abnormality is seen. IMPRESSION: 1. Persistent bibasilar consolidation, increased on the left and stable on the right. 2. Stable small to moderate right pleural effusion and trace left pleural effusion. 3. Emphysema. 4. Stable bilateral rib fractures. 5. Aortic atherosclerosis and coronary artery calcifications. Electronically Signed   By: Leita Birmingham M.D.   On: 04/29/2024 14:34   DG CHEST PORT 1 VIEW Result Date: 04/29/2024 CLINICAL DATA:  Chest pain. EXAM: PORTABLE CHEST 1 VIEW COMPARISON:  Radiograph earlier today FINDINGS: Right-sided dialysis catheter stable in positioning. Right pleural effusion and basilar opacity, question slight increase. Stable heart size and mediastinal contours. No demonstrated pneumothorax. Mild patchy opacity at the left lung base. IMPRESSION: 1. Right pleural effusion and basilar opacity, question slight increase. 2. Mild patchy opacity at the left lung base. Electronically Signed   By: Andrea Gasman M.D.   On: 04/29/2024 12:18   IR THORACENTESIS ASP PLEURAL SPACE W/IMG GUIDE Result Date: 04/28/2024 INDICATION: Thoracentesis Inpatient with CHF with complaint of SOB and cough, recent CXR demonstrating moderate right pleural effusion, request for thoracentesis. She has not previously undergone this procedure. EXAM: ULTRASOUND GUIDED RIGHT THORACENTESIS MEDICATIONS: 10 mL 1% lidocaine  with epinephrine  COMPLICATIONS: None immediate.  PROCEDURE: An ultrasound guided thoracentesis was thoroughly discussed with the patient and questions answered. The benefits, risks, alternatives and complications were also discussed. The patient understands and wishes to proceed with the procedure. Written consent was obtained. Ultrasound was performed to localize and mark an adequate pocket of fluid in the right chest. The area was then prepped and draped in the normal sterile fashion. 1% Lidocaine  was used for local anesthesia. Under ultrasound guidance a 6 Fr Safe-T-Centesis catheter was introduced. Thoracentesis was performed. The catheter was removed and a dressing applied. FINDINGS: A total of approximately 1.2 L of clear, yellow fluid was removed. Samples were sent to the laboratory as requested by the clinical team. IMPRESSION: Successful ultrasound guided RIGHT thoracentesis yielding 1.2 L of pleural fluid. Performed and  dictated by Laymon Coast, NP under the supervision of Dr. Hughes Electronically Signed   By: Thom Hughes M.D.   On: 04/28/2024 18:31   DG Chest 1 View Result Date: 04/28/2024 CLINICAL DATA:  Status post thoracentesis. EXAM: CHEST  1 VIEW COMPARISON:  04/27/2024 FINDINGS: Interval decrease in right pleural effusion. No discernible right-sided pneumothorax after thoracentesis. Persistent right base collapse/consolidation with small persistent bilateral pleural effusions. Right IJ central line remains in place. Telemetry leads overlie the chest. IMPRESSION: Interval decrease in right pleural effusion after thoracentesis. No discernible pneumothorax. Electronically Signed   By: Camellia Candle M.D.   On: 04/28/2024 10:15   DG Chest 2 View Result Date: 04/27/2024 CLINICAL DATA:  Shortness of breath cough EXAM: CHEST - 2 VIEW COMPARISON:  04/12/2024 FINDINGS: Right sided central venous catheter tip at the SVC. Cardiomegaly. Moderate right and small left pleural effusions. Vascular congestion. Airspace disease at the right middle  lobe and right base. Hardware in the cervical spine. Numerous clips at the thoracic inlet IMPRESSION: Cardiomegaly with vascular congestion. Moderate right and small left pleural effusions. Airspace disease at the right middle lobe and right base may be due to atelectasis or pneumonia. Electronically Signed   By: Luke Bun M.D.   On: 04/27/2024 20:40   DG Chest 2 View Result Date: 04/18/2024 EXAM: 2 VIEW(S) XRAY OF THE CHEST 04/12/2024 04:01:04 PM COMPARISON: 03/21/2024 CLINICAL HISTORY: Shortness of breath FINDINGS: LINES, TUBES AND DEVICES: Stable right IJ hemodialysis catheter with tip at superior cavoatrial junction. LUNGS AND PLEURA: Increasing opacity at right lung base obscuring right hemidiaphragm and right heart border. Right pleural effusion. Interlobular septal thickening. No pneumothorax. HEART AND MEDIASTINUM: Cardiomegaly, unchanged from prior. Enlarged central vasculature. BONES AND SOFT TISSUES: Surgical clips at thoracic inlet. Degenerative changes of spine. Partially imaged cervical spinal fusion hardware in place. No acute osseous abnormality. IMPRESSION: 1. Cardiomegaly with central vascular congestion, unchanged from prior. 2. Increasing right basilar opacity and right pleural effusion. Electronically signed by: Dayne Hassell MD 04/18/2024 03:42 PM EST RP Workstation: [id number]   Subjective: Patient was seen and examined at bedside.  Overnight events noted. Patient reports feeling much better , she wants to be discharged after hemodialysis.  Discharge Exam: Vitals:   05/03/24 1100 05/03/24 1128  BP:  (!) 154/67  Pulse: 96 92  Resp: (!) 22 17  Temp:  97.7 F (36.5 C)  SpO2: 97% 98%   Vitals:   05/03/24 1030 05/03/24 1051 05/03/24 1100 05/03/24 1128  BP: (!) 154/71 128/89  (!) 154/67  Pulse: 79 89 96 92  Resp: 16 18 (!) 22 17  Temp:  97.8 F (36.6 C)  97.7 F (36.5 C)  TempSrc:  Oral  Oral  SpO2: 98% 98% 97% 98%  Weight:  74.8 kg    Height:        General: Pt  is alert, awake, not in acute distress Cardiovascular: RRR, S1/S2 +, no rubs, no gallops Respiratory: CTA bilaterally, no wheezing, no rhonchi Abdominal: Soft, NT, ND, bowel sounds + Extremities: no edema, no cyanosis    The results of significant diagnostics from this hospitalization (including imaging, microbiology, ancillary and laboratory) are listed below for reference.     Microbiology: Recent Results (from the past 240 hours)  Resp panel by RT-PCR (RSV, Flu A&B, Covid) Anterior Nasal Swab     Status: None   Collection Time: 04/27/24 10:01 PM   Specimen: Anterior Nasal Swab  Result Value Ref Range Status   SARS Coronavirus 2  by RT PCR NEGATIVE NEGATIVE Final   Influenza A by PCR NEGATIVE NEGATIVE Final   Influenza B by PCR NEGATIVE NEGATIVE Final    Comment: (NOTE) The Xpert Xpress SARS-CoV-2/FLU/RSV plus assay is intended as an aid in the diagnosis of influenza from Nasopharyngeal swab specimens and should not be used as a sole basis for treatment. Nasal washings and aspirates are unacceptable for Xpert Xpress SARS-CoV-2/FLU/RSV testing.  Fact Sheet for Patients: bloggercourse.com  Fact Sheet for Healthcare Providers: seriousbroker.it  This test is not yet approved or cleared by the [place]  FDA and has been authorized for detection and/or diagnosis of SARS-CoV-2 by FDA under an Emergency Use Authorization (EUA). This EUA will remain in effect (meaning this test can be used) for the duration of the COVID-19 declaration under Section 564(b)(1) of the Act, 21 U.S.C. section 360bbb-3(b)(1), unless the authorization is terminated or revoked.     Resp Syncytial Virus by PCR NEGATIVE NEGATIVE Final    Comment: (NOTE) Fact Sheet for Patients: bloggercourse.com  Fact Sheet for Healthcare Providers: seriousbroker.it  This test is not yet approved or cleared by the  [place]  FDA and has been authorized for detection and/or diagnosis of SARS-CoV-2 by FDA under an Emergency Use Authorization (EUA). This EUA will remain in effect (meaning this test can be used) for the duration of the COVID-19 declaration under Section 564(b)(1) of the Act, 21 U.S.C. section 360bbb-3(b)(1), unless the authorization is terminated or revoked.  Performed at [facility], [address] [place], [place] [address]   Gram stain     Status: None   Collection Time: 04/28/24  9:51 AM   Specimen: Lung, Right; Pleural Fluid  Result Value Ref Range Status   Specimen Description PLEURAL  Final   Special Requests RIGHT LUNG  Final   Gram Stain   Final    WBC PRESENT,BOTH PMN AND MONONUCLEAR NO ORGANISMS SEEN CYTOSPIN SMEAR Performed at [facility], [address] [place], [place] [address]    Report Status 04/28/2024 FINAL  Final     Labs: BNP (last 3 results) Recent Labs    08/29/23 2000  BNP 468.9*   Basic Metabolic Panel: Recent Labs  Lab 04/28/24 0615 04/29/24 0426 04/30/24 1034 05/01/24 0218 05/02/24 0314 05/03/24 0355  NA 141 133* 137 137 139 140  K 3.6 4.2 4.0 4.1 3.9 3.8  CL 101 97* 96* 97* 98 99  CO2 28 24 27 25 30 28   GLUCOSE 156* 148* 240* 233* 150* 201*  BUN 41* 24* 42* 56* 28* 50*  CREATININE 3.14* 2.09* 3.04* 3.94* 2.41* 3.50*  CALCIUM  7.7* 8.5* 9.0 8.6* 9.0 9.2  MG 3.3* 1.9  --  1.9 1.8 1.8  PHOS  --   --   --  4.8* 3.1 3.5   Liver Function Tests: Recent Labs  Lab 05/01/24 0218 05/02/24 0314 05/03/24 0355  ALBUMIN  3.1* 3.4* 3.4*   No results for input(s): LIPASE, AMYLASE in the last 168 hours. No results for input(s): AMMONIA in the last 168 hours. CBC: Recent Labs  Lab 04/28/24 0615 04/29/24 0426 05/01/24 0218 05/02/24 0314 05/03/24 0355  WBC 11.9* 11.2* 12.5* 13.2* 14.5*  HGB 9.6* 10.0* 9.7* 10.3* 10.5*  HCT 32.7* 33.1* 31.8* 33.5* 34.0*  MCV 100.9* 100.6* 98.1 97.4 96.0  PLT 312 282 385 450*  447*   Cardiac Enzymes: No results for input(s): CKTOTAL, CKMB, CKMBINDEX, TROPONINI in the last 168 hours. BNP: Invalid input(s): POCBNP CBG: Recent Labs  Lab  05/02/24 1135 05/02/24 1603 05/02/24 2110 05/03/24 0557 05/03/24 1124  GLUCAP 246* 344* 264* 196* 176*   D-Dimer No results for input(s): DDIMER in the last 72 hours. Hgb A1c No results for input(s): HGBA1C in the last 72 hours. Lipid Profile No results for input(s): CHOL, HDL, LDLCALC, TRIG, CHOLHDL, LDLDIRECT in the last 72 hours. Thyroid  function studies No results for input(s): TSH, T4TOTAL, T3FREE, THYROIDAB in the last 72 hours.  Invalid input(s): FREET3 Anemia work up No results for input(s): VITAMINB12, FOLATE, FERRITIN, TIBC, IRON, RETICCTPCT in the last 72 hours. Urinalysis    Component Value Date/Time   COLORURINE YELLOW 03/19/2024 1255   APPEARANCEUR CLEAR 03/19/2024 1255   LABSPEC 1.011 03/19/2024 1255   PHURINE 5.0 03/19/2024 1255   GLUCOSEU NEGATIVE 03/19/2024 1255   GLUCOSEU NEGATIVE 12/09/2018 0944   HGBUR NEGATIVE 03/19/2024 1255   BILIRUBINUR NEGATIVE 03/19/2024 1255   KETONESUR NEGATIVE 03/19/2024 1255   PROTEINUR >=300 (A) 03/19/2024 1255   UROBILINOGEN 0.2 12/09/2018 0944   NITRITE NEGATIVE 03/19/2024 1255   LEUKOCYTESUR NEGATIVE 03/19/2024 1255   Sepsis Labs Recent Labs  Lab 04/29/24 0426 05/01/24 0218 05/02/24 0314 05/03/24 0355  WBC 11.2* 12.5* 13.2* 14.5*   Microbiology Recent Results (from the past 240 hours)  Resp panel by RT-PCR (RSV, Flu A&B, Covid) Anterior Nasal Swab     Status: None   Collection Time: 04/27/24 10:01 PM   Specimen: Anterior Nasal Swab  Result Value Ref Range Status   SARS Coronavirus 2 by RT PCR NEGATIVE NEGATIVE Final   Influenza A by PCR NEGATIVE NEGATIVE Final   Influenza B by PCR NEGATIVE NEGATIVE Final    Comment: (NOTE) The Xpert Xpress SARS-CoV-2/FLU/RSV plus assay is intended as an aid in the  diagnosis of influenza from Nasopharyngeal swab specimens and should not be used as a sole basis for treatment. Nasal washings and aspirates are unacceptable for Xpert Xpress SARS-CoV-2/FLU/RSV testing.  Fact Sheet for Patients: bloggercourse.com  Fact Sheet for Healthcare Providers: seriousbroker.it  This test is not yet approved or cleared by the [place]  FDA and has been authorized for detection and/or diagnosis of SARS-CoV-2 by FDA under an Emergency Use Authorization (EUA). This EUA will remain in effect (meaning this test can be used) for the duration of the COVID-19 declaration under Section 564(b)(1) of the Act, 21 U.S.C. section 360bbb-3(b)(1), unless the authorization is terminated or revoked.     Resp Syncytial Virus by PCR NEGATIVE NEGATIVE Final    Comment: (NOTE) Fact Sheet for Patients: bloggercourse.com  Fact Sheet for Healthcare Providers: seriousbroker.it  This test is not yet approved or cleared by the [place]  FDA and has been authorized for detection and/or diagnosis of SARS-CoV-2 by FDA under an Emergency Use Authorization (EUA). This EUA will remain in effect (meaning this test can be used) for the duration of the COVID-19 declaration under Section 564(b)(1) of the Act, 21 U.S.C. section 360bbb-3(b)(1), unless the authorization is terminated or revoked.  Performed at [facility], [address] [place], [place] [address]   Gram stain     Status: None   Collection Time: 04/28/24  9:51 AM   Specimen: Lung, Right; Pleural Fluid  Result Value Ref Range Status   Specimen Description PLEURAL  Final   Special Requests RIGHT LUNG  Final   Gram Stain   Final    WBC PRESENT,BOTH PMN AND MONONUCLEAR NO ORGANISMS SEEN CYTOSPIN SMEAR Performed at [facility], [address], [place], [place] [address]  Report Status 04/28/2024 FINAL   Final     Time coordinating discharge: Over 30 minutes  SIGNED:   Darcel Dawley, MD  [facility] 05/03/2024, 3:19 PM Pager   If 7PM-7AM, please contact night-coverage     [1]  Allergies Allergen Reactions   Tape Other (See Comments)    SKIN IS VERY THIN- TEARS EASILY!!   Oxycodone  Itching and Other (See Comments)    Pt still takes this med even thought it causes itching   Trelegy Ellipta  [Fluticasone -Umeclidin-Vilant] Nausea And Vomiting, Other (See Comments) and Cough    Made the patient sick to her stomach (only) several days after using it    Trelegy Ellipta  [Fluticasone -Umeclidin-Vilant] Nausea And Vomiting   Trulicity [Dulaglutide] Nausea And Vomiting, Nausea Only, Other (See Comments) and Cough    TRULICITY made the patient sick to her stomach (only) several days after using it   Trulicity [Dulaglutide] Nausea And Vomiting

## 2024-05-03 NOTE — Discharge Instructions (Signed)
 Advised to follow up PCP in one week. Advised to follow-up with pulmonology as scheduled. Advised to take Augmentin  twice a day for 2 more days for aspiration pneumonia. Advised to continue prednisone  taper as prescribed. Advised to continue hemodialysis as an outpatient.

## 2024-05-03 NOTE — Telephone Encounter (Signed)
 Ohtuvayre  paperwork initiated per Dr. Neda request.

## 2024-05-03 NOTE — TOC Transition Note (Signed)
 Transition of Care [facility]) - Discharge Note   Patient Details  Name: Kayla [name] MRN: [id number] Date of Birth: November 26, 1940  [facility]) CM/SW Contact:  Marval Gell, RN Phone Number: 05/03/2024, 2:05 PM   Clinical Narrative:     Janese Cella that patient will DC today. No other needs identified for DC  Final next level of care: Home w Home Health Services Barriers to Discharge: No Barriers Identified   Patient Goals and CMS Choice Patient states their goals for this hospitalization and ongoing recovery are:: to go home CMS Medicare.gov Compare Post Acute Care list provided to:: Patient Choice offered to / list presented to : Patient      Discharge Placement                       Discharge Plan and Services Additional resources added to the After Visit Summary for     Discharge Planning Services: CM Consult Post Acute Care Choice: Home Health                    HH Arranged: PT, OT [facility] Agency: [facility] Care Date [facility] Agency Contacted: 05/03/24 Time HH Agency Contacted: [phone] Representative spoke with at [facility] Agency: Darleene  Social Drivers of Health (SDOH) Interventions SDOH Screenings   Food Insecurity: No Food Insecurity (04/29/2024)  Housing: Low Risk (04/29/2024)  Transportation Needs: No Transportation Needs (04/29/2024)  Utilities: Not At Risk (04/29/2024)  Depression (PHQ2-9): Low Risk (12/15/2022)  Physical Activity: Insufficiently Active (09/30/2021)  Social Connections: Moderately Integrated (05/02/2024)  Stress: No Stress Concern Present (09/30/2021)  Tobacco Use: Medium Risk (04/27/2024)     Readmission Risk Interventions    03/13/2024    3:53 PM 01/19/2024   11:24 AM 11/14/2022    9:23 AM  Readmission Risk Prevention Plan  Transportation Screening Complete Complete Complete  PCP or Specialist Appt within 5-7 Days Complete    Home Care Screening Complete    Medication Review (RN CM) Referral to Pharmacy     Medication Review (RN Care Manager)  Referral to Pharmacy Complete  PCP or Specialist appointment within 3-5 days of discharge   Complete  HRI or Home Care Consult  Complete Complete  SW Recovery Care/Counseling Consult  Complete Complete  Palliative Care Screening  Not Applicable Not Applicable  Skilled Nursing Facility  Not Applicable Not Applicable

## 2024-05-06 ENCOUNTER — Telehealth: Payer: Self-pay | Admitting: Nephrology

## 2024-05-06 NOTE — Telephone Encounter (Signed)
 Transition of Care - Initial Contact after Hospitalization  Date of discharge:   Date of contact: 05/06/2024  Method: Phone Spoke to: Patient  Patient contacted to discuss transition of care from recent inpatient hospitalization. Patient was admitted to [facility] from 12/25 - 05/03/2024 discharge diagnosis of AHRF + pleural effusion + ?aspiration pneumonia  The discharge medication list was reviewed. Patient understands the changes and has no concerns.   Patient will return to his/her outpatient HD unit on: Monday. S/p HD yesterday, felt weak after, but breathing is ok today.  She was wondering how we will know if her lungs are better - likely plan CXR in 1 mo. Keep bringing EDW down as she tolerates, she was ok with plan.  Izetta Boehringer, PA-C [employer] Pager [phone]

## 2024-05-08 ENCOUNTER — Ambulatory Visit: Payer: Self-pay | Admitting: Pulmonary Disease

## 2024-05-08 NOTE — Telephone Encounter (Signed)
 Answer Assessment - Initial Assessment Questions 1. RESPIRATORY STATUS: Describe your breathing? (e.g., wheezing, shortness of breath, unable to speak, severe coughing)   Patient declines Nurse Triage. Patient reports, I am just doing what the doctor told me to do, I need to come in and sign paperwork.   Advised 911 if symptoms worsen: severe diff breathing , chest pain >5 min, faint. Patient verbalized understanding.  Protocols used: Breathing Difficulty-A-AH

## 2024-05-08 NOTE — Telephone Encounter (Signed)
 Called CAL, no answer. Patient is requesting to come into office to sign paperwork, per MD instructions.  Patient declines Nurse Triage

## 2024-05-08 NOTE — Telephone Encounter (Signed)
 First attempt, phone kept ringing, unable to leave message.  Copied from CRM #[id number]. Topic: Clinical - Red Word Triage >> May 08, 2024  9:13 AM Leila C wrote: Red Word that prompted transfer to Nurse Triage: Patient's spouse Lynwood [phone] states patient was released from [facility] 05/03/24 and was advised to come to the office for paperwork and office visit with Dr. Neda. Lynwood states patient has shortness of breath, and wheezing but it's better than when she was in the hospital. Lynwood states patient may need to have fluid removed from lungs again. Lynwood states patient has new [employer] Member ID# [id number]; updated in demographics. Lynwood states patient needs to be seen as soon as possible, but cannot come in on Monday, Wednesday or Friday until after 2 pm, patient has dialysis treatments. Next available times for NP, 05/12/24 at 11:30 am and Dr. Neda 05/19/24 at 10:30 am. Unable to reach CAL, tried a few times. Please advise.    ----------------------------------------------------------------------- From previous Reason for Contact - Scheduling: Patient/patient representative is calling to schedule an appointment. Refer to attachments for appointment information. >> May 08, 2024  9:20 AM Leila BROCKS wrote: Lynwood has to take patient for dialysis treatment now, and cannot hold any longer. Please call back [phone] first, Lynwood states do not leave a message on cell phone, leave a message at home phone [phone].

## 2024-05-09 ENCOUNTER — Telehealth: Payer: Self-pay | Admitting: *Deleted

## 2024-05-09 ENCOUNTER — Encounter: Payer: Self-pay | Admitting: *Deleted

## 2024-05-09 NOTE — Telephone Encounter (Signed)
 Dr. Neda, The patient has completed her portion of the form for Oht[employer]re .  The form is in your sign folder for you signature.  Thank you.

## 2024-05-09 NOTE — Telephone Encounter (Signed)
 Patient responded via mychart.     Closing encounter.

## 2024-05-09 NOTE — Telephone Encounter (Signed)
 Attempted to call [phone], no answer and VM is full.  Attempted to call [phone], no answer.  Did not leave VM per message left on 05/08/24.  Paperwork for Ohtuvayre  placed up front in the file cabinet for patient to come by and complete.  I will send a mychart message and let her and her husband know since I have been unable to reach them by phone.  If she or her husband call back, let them know that the paperwork is up front for her to complete the first page and sign.  Thank you.

## 2024-05-09 NOTE — Telephone Encounter (Signed)
 Patient has filled out her portion of the Ohtuvayre  paperwork and is placed in Dr.Olalere's box for completion. She would like a phone call when it is faxed off for personal confirmation.

## 2024-05-11 ENCOUNTER — Ambulatory Visit: Admitting: Pulmonary Disease

## 2024-05-19 ENCOUNTER — Other Ambulatory Visit (HOSPITAL_COMMUNITY): Payer: Self-pay

## 2024-05-19 ENCOUNTER — Telehealth: Payer: Self-pay

## 2024-05-19 NOTE — Telephone Encounter (Signed)
 Received Oht[employer]  new start paperwork. Completed form and faxed with clinicals and insurance card copy to [facility]   Phone#: [phone] Fax#: [phone]

## 2024-05-24 NOTE — Telephone Encounter (Signed)
 Received fax from [facility] confirming receipt of rx.

## 2024-05-24 NOTE — Telephone Encounter (Signed)
 Received fax from [employer] with summary of benefits. Referral form for Ohtuvayre  received. Rx will be triaged to [facility] (phone: [phone]). Once benefits investigation completed, pharmacy will reach out the patient to schedule shipment. If medication is unaffordable, patient will need to express financial hardship to be referred back to [facility] for patient assistance program pre-screening.   Patient ID: [id number] Pharmacy phone: [facility] (phone: [phone]) [facility] Phone#: [phone]

## 2024-05-29 ENCOUNTER — Ambulatory Visit: Admitting: Adult Health

## 2024-05-29 ENCOUNTER — Other Ambulatory Visit: Payer: Self-pay

## 2024-05-29 ENCOUNTER — Emergency Department (HOSPITAL_COMMUNITY)

## 2024-05-29 ENCOUNTER — Inpatient Hospital Stay (HOSPITAL_COMMUNITY)
Admission: EM | Admit: 2024-05-29 | Discharge: 2024-05-31 | DRG: 193 | Disposition: A | Discharge status: Home / Self Care | Attending: Internal Medicine | Admitting: Internal Medicine

## 2024-05-29 DIAGNOSIS — Z90710 Acquired absence of both cervix and uterus: Secondary | ICD-10-CM

## 2024-05-29 DIAGNOSIS — Z91048 Other nonmedicinal substance allergy status: Secondary | ICD-10-CM

## 2024-05-29 DIAGNOSIS — N186 End stage renal disease: Secondary | ICD-10-CM | POA: Diagnosis present

## 2024-05-29 DIAGNOSIS — I132 Hypertensive heart and chronic kidney disease with heart failure and with stage 5 chronic kidney disease, or end stage renal disease: Secondary | ICD-10-CM | POA: Diagnosis present

## 2024-05-29 DIAGNOSIS — Z950 Presence of cardiac pacemaker: Secondary | ICD-10-CM

## 2024-05-29 DIAGNOSIS — Z808 Family history of malignant neoplasm of other organs or systems: Secondary | ICD-10-CM

## 2024-05-29 DIAGNOSIS — I953 Hypotension of hemodialysis: Secondary | ICD-10-CM | POA: Diagnosis present

## 2024-05-29 DIAGNOSIS — J189 Pneumonia, unspecified organism: Secondary | ICD-10-CM | POA: Diagnosis present

## 2024-05-29 DIAGNOSIS — E890 Postprocedural hypothyroidism: Secondary | ICD-10-CM | POA: Diagnosis present

## 2024-05-29 DIAGNOSIS — I2510 Atherosclerotic heart disease of native coronary artery without angina pectoris: Secondary | ICD-10-CM | POA: Diagnosis present

## 2024-05-29 DIAGNOSIS — F419 Anxiety disorder, unspecified: Secondary | ICD-10-CM | POA: Diagnosis present

## 2024-05-29 DIAGNOSIS — Z87891 Personal history of nicotine dependence: Secondary | ICD-10-CM

## 2024-05-29 DIAGNOSIS — Z9981 Dependence on supplemental oxygen: Secondary | ICD-10-CM

## 2024-05-29 DIAGNOSIS — D631 Anemia in chronic kidney disease: Secondary | ICD-10-CM | POA: Diagnosis present

## 2024-05-29 DIAGNOSIS — J440 Chronic obstructive pulmonary disease with acute lower respiratory infection: Secondary | ICD-10-CM | POA: Diagnosis present

## 2024-05-29 DIAGNOSIS — I272 Pulmonary hypertension, unspecified: Secondary | ICD-10-CM | POA: Diagnosis present

## 2024-05-29 DIAGNOSIS — J90 Pleural effusion, not elsewhere classified: Secondary | ICD-10-CM | POA: Diagnosis not present

## 2024-05-29 DIAGNOSIS — Z7901 Long term (current) use of anticoagulants: Secondary | ICD-10-CM | POA: Diagnosis not present

## 2024-05-29 DIAGNOSIS — E119 Type 2 diabetes mellitus without complications: Secondary | ICD-10-CM

## 2024-05-29 DIAGNOSIS — Z66 Do not resuscitate: Secondary | ICD-10-CM | POA: Diagnosis present

## 2024-05-29 DIAGNOSIS — E1142 Type 2 diabetes mellitus with diabetic polyneuropathy: Secondary | ICD-10-CM | POA: Diagnosis present

## 2024-05-29 DIAGNOSIS — Z794 Long term (current) use of insulin: Secondary | ICD-10-CM

## 2024-05-29 DIAGNOSIS — Z992 Dependence on renal dialysis: Secondary | ICD-10-CM

## 2024-05-29 DIAGNOSIS — J449 Chronic obstructive pulmonary disease, unspecified: Secondary | ICD-10-CM | POA: Diagnosis not present

## 2024-05-29 DIAGNOSIS — D72829 Elevated white blood cell count, unspecified: Secondary | ICD-10-CM | POA: Diagnosis present

## 2024-05-29 DIAGNOSIS — Z8249 Family history of ischemic heart disease and other diseases of the circulatory system: Secondary | ICD-10-CM

## 2024-05-29 DIAGNOSIS — F32A Depression, unspecified: Secondary | ICD-10-CM | POA: Diagnosis present

## 2024-05-29 DIAGNOSIS — G43909 Migraine, unspecified, not intractable, without status migrainosus: Secondary | ICD-10-CM | POA: Diagnosis present

## 2024-05-29 DIAGNOSIS — J918 Pleural effusion in other conditions classified elsewhere: Secondary | ICD-10-CM | POA: Diagnosis present

## 2024-05-29 DIAGNOSIS — I071 Rheumatic tricuspid insufficiency: Secondary | ICD-10-CM | POA: Diagnosis present

## 2024-05-29 DIAGNOSIS — I5033 Acute on chronic diastolic (congestive) heart failure: Secondary | ICD-10-CM | POA: Diagnosis present

## 2024-05-29 DIAGNOSIS — J9621 Acute and chronic respiratory failure with hypoxia: Secondary | ICD-10-CM | POA: Diagnosis present

## 2024-05-29 DIAGNOSIS — J9811 Atelectasis: Secondary | ICD-10-CM | POA: Diagnosis present

## 2024-05-29 DIAGNOSIS — I495 Sick sinus syndrome: Secondary | ICD-10-CM | POA: Diagnosis present

## 2024-05-29 DIAGNOSIS — I480 Paroxysmal atrial fibrillation: Secondary | ICD-10-CM | POA: Diagnosis present

## 2024-05-29 DIAGNOSIS — Z885 Allergy status to narcotic agent status: Secondary | ICD-10-CM

## 2024-05-29 DIAGNOSIS — Y95 Nosocomial condition: Secondary | ICD-10-CM | POA: Diagnosis present

## 2024-05-29 DIAGNOSIS — Z89422 Acquired absence of other left toe(s): Secondary | ICD-10-CM

## 2024-05-29 DIAGNOSIS — Z96652 Presence of left artificial knee joint: Secondary | ICD-10-CM | POA: Diagnosis present

## 2024-05-29 DIAGNOSIS — M898X9 Other specified disorders of bone, unspecified site: Secondary | ICD-10-CM | POA: Diagnosis present

## 2024-05-29 DIAGNOSIS — D638 Anemia in other chronic diseases classified elsewhere: Secondary | ICD-10-CM | POA: Diagnosis not present

## 2024-05-29 DIAGNOSIS — Z833 Family history of diabetes mellitus: Secondary | ICD-10-CM

## 2024-05-29 DIAGNOSIS — Z79899 Other long term (current) drug therapy: Secondary | ICD-10-CM

## 2024-05-29 DIAGNOSIS — Z1152 Encounter for screening for COVID-19: Secondary | ICD-10-CM | POA: Diagnosis not present

## 2024-05-29 DIAGNOSIS — E1122 Type 2 diabetes mellitus with diabetic chronic kidney disease: Secondary | ICD-10-CM | POA: Diagnosis present

## 2024-05-29 DIAGNOSIS — Z888 Allergy status to other drugs, medicaments and biological substances status: Secondary | ICD-10-CM

## 2024-05-29 DIAGNOSIS — Z801 Family history of malignant neoplasm of trachea, bronchus and lung: Secondary | ICD-10-CM

## 2024-05-29 DIAGNOSIS — E785 Hyperlipidemia, unspecified: Secondary | ICD-10-CM | POA: Diagnosis present

## 2024-05-29 DIAGNOSIS — Z79890 Hormone replacement therapy (postmenopausal): Secondary | ICD-10-CM

## 2024-05-29 DIAGNOSIS — G4733 Obstructive sleep apnea (adult) (pediatric): Secondary | ICD-10-CM | POA: Diagnosis present

## 2024-05-29 LAB — CBC WITH DIFFERENTIAL/PLATELET
Abs Immature Granulocytes: 0.06 10*3/uL (ref 0.00–0.07)
Basophils Absolute: 0 10*3/uL (ref 0.0–0.1)
Basophils Relative: 0 %
Eosinophils Absolute: 0.1 10*3/uL (ref 0.0–0.5)
Eosinophils Relative: 1 %
HCT: 34 % — ABNORMAL LOW (ref 36.0–46.0)
Hemoglobin: 9.8 g/dL — ABNORMAL LOW (ref 12.0–15.0)
Immature Granulocytes: 1 %
Lymphocytes Relative: 9 %
Lymphs Abs: 1 10*3/uL (ref 0.7–4.0)
MCH: 29.8 pg (ref 26.0–34.0)
MCHC: 28.8 g/dL — ABNORMAL LOW (ref 30.0–36.0)
MCV: 103.3 fL — ABNORMAL HIGH (ref 80.0–100.0)
Monocytes Absolute: 0.8 10*3/uL (ref 0.1–1.0)
Monocytes Relative: 7 %
Neutro Abs: 8.9 10*3/uL — ABNORMAL HIGH (ref 1.7–7.7)
Neutrophils Relative %: 82 %
Platelets: 385 10*3/uL (ref 150–400)
RBC: 3.29 MIL/uL — ABNORMAL LOW (ref 3.87–5.11)
RDW: 16.2 % — ABNORMAL HIGH (ref 11.5–15.5)
WBC: 10.8 10*3/uL — ABNORMAL HIGH (ref 4.0–10.5)
nRBC: 0 % (ref 0.0–0.2)

## 2024-05-29 LAB — GLUCOSE, CAPILLARY
Glucose-Capillary: 166 mg/dL — ABNORMAL HIGH (ref 70–99)
Glucose-Capillary: 128 mg/dL — ABNORMAL HIGH (ref 70–99)

## 2024-05-29 LAB — COMPREHENSIVE METABOLIC PANEL WITH GFR
ALT: 9 U/L (ref 0–44)
AST: 12 U/L — ABNORMAL LOW (ref 15–41)
Albumin: 3.3 g/dL — ABNORMAL LOW (ref 3.5–5.0)
Alkaline Phosphatase: 81 U/L (ref 38–126)
Anion gap: 14 (ref 5–15)
BUN: 60 mg/dL — ABNORMAL HIGH (ref 8–23)
CO2: 25 mmol/L (ref 22–32)
Calcium: 9.3 mg/dL (ref 8.9–10.3)
Chloride: 103 mmol/L (ref 98–111)
Creatinine, Ser: 3.91 mg/dL — ABNORMAL HIGH (ref 0.44–1.00)
GFR, Estimated: 11 mL/min — ABNORMAL LOW
Glucose, Bld: 208 mg/dL — ABNORMAL HIGH (ref 70–99)
Potassium: 5.3 mmol/L — ABNORMAL HIGH (ref 3.5–5.1)
Sodium: 142 mmol/L (ref 135–145)
Total Bilirubin: 0.3 mg/dL (ref 0.0–1.2)
Total Protein: 7 g/dL (ref 6.5–8.1)

## 2024-05-29 LAB — PRO BRAIN NATRIURETIC PEPTIDE: Pro Brain Natriuretic Peptide: 9768 pg/mL — ABNORMAL HIGH

## 2024-05-29 LAB — PROCALCITONIN: Procalcitonin: 0.23 ng/mL

## 2024-05-29 LAB — RESP PANEL BY RT-PCR (RSV, FLU A&B, COVID)  RVPGX2
Influenza A by PCR: NEGATIVE
Influenza B by PCR: NEGATIVE
Resp Syncytial Virus by PCR: NEGATIVE
SARS Coronavirus 2 by RT PCR: NEGATIVE

## 2024-05-29 LAB — I-STAT CHEM 8, ED
BUN: 62 mg/dL — ABNORMAL HIGH (ref 8–23)
Calcium, Ion: 1.14 mmol/L — ABNORMAL LOW (ref 1.15–1.40)
Chloride: 105 mmol/L (ref 98–111)
Creatinine, Ser: 4.2 mg/dL — ABNORMAL HIGH (ref 0.44–1.00)
Glucose, Bld: 200 mg/dL — ABNORMAL HIGH (ref 70–99)
HCT: 33 % — ABNORMAL LOW (ref 36.0–46.0)
Hemoglobin: 11.2 g/dL — ABNORMAL LOW (ref 12.0–15.0)
Potassium: 5.1 mmol/L (ref 3.5–5.1)
Sodium: 142 mmol/L (ref 135–145)
TCO2: 26 mmol/L (ref 22–32)

## 2024-05-29 LAB — HEPATITIS B SURFACE ANTIGEN: Hepatitis B Surface Ag: NONREACTIVE

## 2024-05-29 MED ORDER — FLUTICASONE PROPIONATE 50 MCG/ACT NA SUSP: 1.0000 | Freq: Two times a day (BID) | NASAL | Status: DC | PRN

## 2024-05-29 MED ORDER — AZITHROMYCIN 500 MG PO TABS
500.0000 mg | ORAL_TABLET | Freq: Every day | ORAL | Status: DC
  Administered 2024-05-29 – 2024-05-31 (×3): 500 mg via ORAL
  Filled 2024-05-29: qty 1
  Filled 2024-05-29: qty 2
  Filled 2024-05-29: qty 1

## 2024-05-29 MED ORDER — LEVOTHYROXINE SODIUM 100 MCG PO TABS
200.0000 ug | ORAL_TABLET | ORAL | Status: DC
  Administered 2024-05-30 – 2024-05-31 (×2): 200 ug via ORAL
  Filled 2024-05-29 (×2): qty 2

## 2024-05-29 MED ORDER — LEVOTHYROXINE SODIUM 100 MCG PO TABS: 100.0000 ug | ORAL_TABLET | ORAL | Status: DC

## 2024-05-29 MED ORDER — POLYVINYL ALCOHOL 1.4 % OP SOLN: 1.0000 [drp] | OPHTHALMIC | Status: DC | PRN

## 2024-05-29 MED ORDER — CHLORHEXIDINE GLUCONATE CLOTH 2 % EX PADS
6.0000 | MEDICATED_PAD | Freq: Every day | CUTANEOUS | Status: DC
  Administered 2024-05-30 – 2024-05-31 (×2): 6 via TOPICAL

## 2024-05-29 MED ORDER — TORSEMIDE 100 MG PO TABS
100.0000 mg | ORAL_TABLET | Freq: Two times a day (BID) | ORAL | Status: DC
  Administered 2024-05-30 – 2024-05-31 (×3): 100 mg via ORAL
  Filled 2024-05-29 (×4): qty 1

## 2024-05-29 MED ORDER — POLYETHYL GLYCOL-PROPYL GLYCOL 0.4-0.3 % OP SOLN: 1.0000 [drp] | OPHTHALMIC | Status: DC | PRN

## 2024-05-29 MED ORDER — ONDANSETRON HCL 4 MG/2ML IJ SOLN: 4.0000 mg | Freq: Four times a day (QID) | INTRAMUSCULAR | Status: DC | PRN

## 2024-05-29 MED ORDER — ONDANSETRON HCL 4 MG PO TABS: 4.0000 mg | ORAL_TABLET | Freq: Four times a day (QID) | ORAL | Status: DC | PRN

## 2024-05-29 MED ORDER — SODIUM CHLORIDE 0.9 % IV SOLN
1.0000 g | INTRAVENOUS | Status: DC
  Administered 2024-05-29 – 2024-05-30 (×2): 1 g via INTRAVENOUS
  Filled 2024-05-29 (×2): qty 10

## 2024-05-29 MED ORDER — APIXABAN 2.5 MG PO TABS
2.5000 mg | ORAL_TABLET | Freq: Two times a day (BID) | ORAL | Status: DC
  Administered 2024-05-29 – 2024-05-31 (×4): 2.5 mg via ORAL
  Filled 2024-05-29 (×4): qty 1

## 2024-05-29 MED ORDER — ALBUTEROL SULFATE (2.5 MG/3ML) 0.083% IN NEBU
2.5000 mg | INHALATION_SOLUTION | RESPIRATORY_TRACT | Status: DC | PRN
  Administered 2024-05-29: 2.5 mg via RESPIRATORY_TRACT
  Filled 2024-05-29: qty 3

## 2024-05-29 MED ORDER — INSULIN ASPART 100 UNIT/ML IJ SOLN: 0.0000 [IU] | Freq: Every day | INTRAMUSCULAR | Status: DC

## 2024-05-29 MED ORDER — ALBUTEROL SULFATE (2.5 MG/3ML) 0.083% IN NEBU: 2.5000 mg | INHALATION_SOLUTION | RESPIRATORY_TRACT | Status: DC | PRN

## 2024-05-29 MED ORDER — BUDESON-GLYCOPYRROL-FORMOTEROL 160-9-4.8 MCG/ACT IN AERO
2.0000 | INHALATION_SPRAY | Freq: Two times a day (BID) | RESPIRATORY_TRACT | Status: DC
  Administered 2024-05-29 – 2024-05-31 (×4): 2 via RESPIRATORY_TRACT
  Filled 2024-05-29: qty 5.9

## 2024-05-29 MED ORDER — INSULIN ASPART 100 UNIT/ML IJ SOLN
0.0000 [IU] | Freq: Three times a day (TID) | INTRAMUSCULAR | Status: DC
  Administered 2024-05-29: 2 [IU] via SUBCUTANEOUS
  Administered 2024-05-30: 1 [IU] via SUBCUTANEOUS
  Filled 2024-05-29: qty 2
  Filled 2024-05-29: qty 1

## 2024-05-29 MED ORDER — ACETAMINOPHEN 325 MG PO TABS: 650.0000 mg | ORAL_TABLET | Freq: Four times a day (QID) | ORAL | Status: DC | PRN

## 2024-05-29 MED ORDER — SODIUM CHLORIDE 0.9% FLUSH
3.0000 mL | Freq: Two times a day (BID) | INTRAVENOUS | Status: DC
  Administered 2024-05-29 – 2024-05-30 (×3): 3 mL via INTRAVENOUS

## 2024-05-29 MED ORDER — ACETAMINOPHEN 650 MG RE SUPP: 650.0000 mg | Freq: Four times a day (QID) | RECTAL | Status: DC | PRN

## 2024-05-29 NOTE — Consult Note (Signed)
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 mg     Social History:  reports that she has quit smoking. Her smoking use included cigarettes. She started smoking about 36 years ago. She has never used smokeless tobacco. She reports that she does not drink alcohol  and does not use drugs.  Family History:   Family History  Problem Relation Age of Onset   Heart Problems Mother    Heart defect Mother        valve problems   Heart Problems Father    Heart disease Brother    Lung cancer Daughter        non small cell    Diabetes Maternal Aunt    Diabetes Maternal Uncle    Throat cancer Brother    Colon cancer Neg Hx    Colon polyps Neg Hx    Esophageal cancer Neg Hx    Rectal cancer Neg Hx    Stomach cancer Neg Hx     Blood pressure  139/62, pulse (!) 111, temperature (!)  97.5 F (36.4 C), temperature source Oral, resp. rate (!) 25, height 5' 5 (1.651 m), weight 74.8 kg, SpO2 90%. General elderly female in bed in moderate acute distress HEENT NCAT Neck supple trachea midline Lungs poor air movement Heart S1S2 no rub; harsh murmur Abdomen soft nontender nondistended Extremities no edema  Neuro - awake and conversant  Psych normal mood and affect Access RIJ tunn catheter in place; LUE BCF and with bruising, torn skin          Kendon Sedeno, LYNWOOD ORN, MD 05/29/2024, 5:23 PM      [1]  Allergies Allergen Reactions   Tape Other (See Comments)    SKIN IS VERY THIN- TEARS EASILY!!   Oxycodone  Itching and Other (See Comments)    Pt still takes this med even thought it causes itching   Trelegy Ellipta  [Fluticasone -Umeclidin-Vilant] Nausea And Vomiting, Other (See Comments) and Cough    Made the patient sick to her stomach (only) several days after using it    Trelegy Ellipta  [Fluticasone -Umeclidin-Vilant] Nausea And Vomiting   Trulicity [Dulaglutide] Nausea And Vomiting, Nausea Only, Other (See Comments) and Cough    TRULICITY made the patient sick to her stomach (only) several days after using it   Trulicity [Dulaglutide] Nausea And Vomiting

## 2024-05-29 NOTE — H&P (Signed)
 " History and Physical    Patient: Kayla [name] FMW:[id number] DOB: 01-17-1941 DOA: 05/29/2024 DOS: the patient was seen and examined on 05/29/2024 PCP: Stephanie Charlene CROME, MD  Patient coming from: Home  Chief Complaint:  Chief Complaint  Patient presents with   Cough   HPI: Kayla [name] is a 84 y.o. female with medical history significant of hypertension, hyperlipidemia, CAD, paroxysmal atrial fibrillation, sick sinus syndrome s/p PPM, ESRD on HD MWF, chronic respiratory failure on 3 L of oxygen  who , presents with increased shortness of breath and cough.  She has experienced a noticeable increase in shortness of breath since last night, although she typically experiences breathlessness with any exertion. She is on three liters of oxygen  continuously at home and uses a portable oxygen  tank when traveling.  She has had a persistent cough since being admitted to the hospital on Christmas Day. She was discharged on New Year's Eve.  She was treated for acute on chronic hypoxic respiratory failure secondary to recurrent large right-sided pleural effusion and COPD exacerbation thought secondary to bilateral pneumonia treated with Unasyn  and underwent thoracentesis.  Lights criteria was not met and thought to be likely transudative in nature.     She is currently undergoing dialysis three times a week since that hospitalization.     Denies any recent fever or chills, nausea or vomiting.  In the emergency department patient was noted to be afebrile with respirations 16-25, blood pressures 114/58 to 124/58, O2 saturations maintained on room air.  Labs significant for WBC 10.8, hemoglobin 11.2, potassium 5.3, BUN 60, creatinine 3.91, and glucose 208.  Chest x-ray noted low volume effusion with right basilar consolidation and findings representing atelectasis or pneumonia cannot be excluded.  Respiratory virus panel pending. Patient had been given antibiotics of Rocephin  and azithromycin .  Review  of Systems: As mentioned in the history of present illness. All other systems reviewed and are negative. Past Medical History:  Diagnosis Date   Allergy    Anxiety    Arthritis    CAD (coronary artery disease)    Mild per remote cath in 2006, /    Nuclear, January, 2013, no significant abnormality,   Carotid artery disease    Doppler, January, 2013, 0 39% RIC A., 40-59% LICA, stable   Cataract    CHF (congestive heart failure) (HCC)    Chronic kidney disease    dialysis Mon Friday   Complication of anesthesia    wakes up mean   COPD 02/16/2007   Intolerant of Spiriva , tudorza Intolerant of Trelegy    COPD (chronic obstructive pulmonary disease) (HCC)    Coronary atherosclerosis 11/24/2008   Catheterization 2006, nonobstructive coronary disease   //   nuclear 2013, low risk     Depression    Diabetes mellitus, type 2 (HCC)    Diverticula of colon    DIVERTICULOSIS OF COLON 03/23/2007   Qualifier: Diagnosis of  By: Shellia MD, Vineet     Dizziness    occasional   Dizzy spells 05/04/2011   DM (diabetes mellitus) (HCC) 03/23/2007   Qualifier: Diagnosis of  By: Shellia MD, Vineet     Dyspnea    On exertion   Ejection fraction    Essential hypertension 02/16/2007   Qualifier: Diagnosis of  By: Nicholaus CMA, Tammy     G E R D 03/23/2007   Qualifier: Diagnosis of  By: Shellia MD, Vineet     Gall bladder disease 04/28/2016   Gastritis and gastroduodenitis  GERD (gastroesophageal reflux disease)    Goiter    hx of   Headache(784.0)    migraine   Hyperlipidemia    Hypertension    Hypothyroidism    HYPOTHYROIDISM, POSTSURGICAL 06/04/2008   Qualifier: Diagnosis of  By: Kassie MD, Sean A    Left ventricular hypertrophy    Leg edema    occasional swelling   Leg swelling 12/09/2018   Myofascial pain syndrome, cervical 01/06/2018   Obesity    OBESITY 11/24/2008   Qualifier: Diagnosis of  By: Jaramillo, Luz     OBSTRUCTIVE SLEEP APNEA 02/16/2007   Auto CPAP 09/03/13 to 10/02/13 >>  used on 30 of 30 nights with average 6 hrs and 3 min.  Average AHI is 3.1 with median CPAP 5 cm H2O and 95 th percentile CPAP 6 cm H2O.     Occipital neuralgia of right side 01/06/2018   OSA (obstructive sleep apnea)    uses cpap - setting of 12   Pain in joint of right hip 01/21/2018   Palpitations 01/21/2011   48 hour Holter, 2015, scattered PACs and PVCs.    Paroxysmal atrial fibrillation (HCC)    Pneumonia    PONV (postoperative nausea and vomiting)    severe after every surgery   Pulmonary hypertension, secondary    RHINITIS 07/21/2010        RUQ abdominal pain    S/P left TKA 07/27/2011   Sinus node dysfunction (HCC) 11/08/2015   Sleep apnea    Spinal stenosis of cervical region 11/07/2014   Urinary frequency 12/09/2018   UTI (urinary tract infection) 08/15/2019   per pt report   VENTRICULAR HYPERTROPHY, LEFT 11/24/2008   Qualifier: Diagnosis of  By: Justina Kos     Past Surgical History:  Procedure Laterality Date   A/V FISTULAGRAM N/A 01/26/2024   Procedure: A/V Fistulagram;  Surgeon: Serene Gaile ORN, MD;  Location: [facility];  Service: Cardiovascular;  Laterality: N/A;   ABDOMINAL HYSTERECTOMY     with appendectomy and colon polypectomy   AMPUTATION TOE Left 12/15/2019   Procedure: AMPUTATION  LEFT GREAT TOE;  Surgeon: Gretel Ozell PARAS, DPM;  Location: [facility];  Service: Podiatry;  Laterality: Left;   AMPUTATION TOE Left 01/22/2022   Procedure: AMPUTATION TOE 4th digit;  Surgeon: Tobie Franky SQUIBB, DPM;  Location: [facility];  Service: Podiatry;  Laterality: Left;   AMPUTATION TOE Left 09/04/2022   Procedure: LEFT BIG TOE AMPUTAION;  Surgeon: Gershon Donnice SAUNDERS, DPM;  Location: [facility];  Service: Podiatry;  Laterality: Left;   ANTERIOR CERVICAL DECOMP/DISCECTOMY FUSION N/A 11/07/2014   Procedure: Anterior Cervical diskectomy and fusion Cervical four-five, Cervical five-six, Cervical six-seven;  Surgeon: Arley Helling, MD;  Location: [facility];  Service: Neurosurgery;  Laterality:  N/A;   APPENDECTOMY     ARTERY BIOPSY Right 02/16/2017   Procedure: BIOPSY TEMPORAL ARTERY;  Surgeon: Jama Cordella MATSU, MD;  Location: [facility];  Service: Vascular;  Laterality: Right;   ARTERY BIOPSY Left 04/16/2020   Procedure: BIOPSY TEMPORAL ARTERY;  Surgeon: Jama Cordella MATSU, MD;  Location: [facility];  Service: Vascular;  Laterality: Left;  LEFT TEMPLE   AV FISTULA PLACEMENT Left 04/13/2023   Procedure: LEFT ARM ARTERIOVENOUS (AV) FISTULA CREATION;  Surgeon: Lanis Fonda BRAVO, MD;  Location: [facility];  Service: Vascular;  Laterality: Left;   BACK SURGERY     BASCILIC VEIN TRANSPOSITION Left 02/14/2024   Procedure: TRANSPOSITION,OF FISTULA;  Surgeon: Lanis Fonda BRAVO, MD;  Location: [facility] OR;  Service:  Vascular;  Laterality: Left;   CARDIAC CATHETERIZATION     CATARACT EXTRACTION W/ INTRAOCULAR LENS  IMPLANT, BILATERAL Bilateral    CHOLECYSTECTOMY N/A 04/29/2016   Procedure: LAPAROSCOPIC CHOLECYSTECTOMY WITH INTRAOPERATIVE CHOLANGIOGRAM;  Surgeon: Alm Angle, MD;  Location: [facility];  Service: General;  Laterality: N/A;   EP IMPLANTABLE DEVICE N/A 11/08/2015   MRI COMPATABLE -- Procedure: Pacemaker Implant;  Surgeon: Elspeth JAYSON Sage, MD;  Location: [facility];  Service: Cardiovascular;  Laterality: N/A;   INCISION AND DRAINAGE ABSCESS Right 05/30/2021   Procedure: IRRIGATION AND DEBRIDEMENT OF PREPATELLA BURSA;  Surgeon: Ernie Cough, MD;  Location: [facility];  Service: Orthopedics;  Laterality: Right;  60 wants standard knee draping and pulse lavage   INCISION AND DRAINAGE ABSCESS Right 07/22/2021   Procedure: INCISION AND DRAINAGE ABSCESS RIGHT KNEE;  Surgeon: Ernie Cough, MD;  Location: [facility];  Service: Orthopedics;  Laterality: Right;   INSERT / REPLACE / REMOVE PACEMAKER  2017   Dr. Beverli [facility])   IR FLUORO GUIDE CV LINE RIGHT  07/25/2021   IR FLUORO GUIDE CV LINE RIGHT  11/04/2022   IR REMOVAL TUN CV CATH W/O FL  09/04/2021   IR THORACENTESIS RIGHT ASP PLEURAL SPACE W/IMG GUIDE   04/28/2024   IR US  GUIDE VASC ACCESS RIGHT  07/25/2021   IR US  GUIDE VASC ACCESS RIGHT  11/04/2022   JOINT REPLACEMENT     LEAD EXTRACTION N/A 09/08/2022   Procedure: LEAD EXTRACTION;  Surgeon: Waddell Danelle ORN, MD;  [facility];  Service: Cardiovascular;  Laterality: N/A;   LEAD EXTRACTION N/A 09/11/2022   Procedure: LEAD EXTRACTION;  Surgeon: Waddell Danelle ORN, MD;  Location: [facility];  Service: Cardiovascular;  Laterality: N/A;   LIGATION OF COMPETING BRANCHES OF ARTERIOVENOUS FISTULA Left 02/14/2024   Procedure: LIGATION OF COMPETING BRANCHES OF ARTERIOVENOUS FISTULA;  Surgeon: Lanis Fonda BRAVO, MD;  Location: [facility] OR;  Service: Vascular;  Laterality: Left;   LUMBAR DISC SURGERY  1990's   x 2   REVISON OF ARTERIOVENOUS FISTULA Left 02/14/2024   Procedure: REVISON OF ARTERIOVENOUS FISTULA;  Surgeon: Lanis Fonda BRAVO, MD;  Location[facility] OR;  Service: Vascular;  Laterality: Left;   TEE WITHOUT CARDIOVERSION N/A 09/07/2022   Procedure: TRANSESOPHAGEAL ECHOCARDIOGRAM;  Surgeon: Santo Stanly LABOR, MD;  [facility]: [facility];  Service: Cardiovascular;  Laterality: N/A;   THYROIDECTOMY  2011   TOTAL KNEE ARTHROPLASTY  07/27/2011   Procedure: TOTAL KNEE ARTHROPLASTY;  Surgeon: Cough JONETTA Ernie, MD;  Location: [facility];  Service: Orthopedics;  Laterality: Left;   UPPER ESOPHAGEAL ENDOSCOPIC ULTRASOUND (EUS) N/A 05/14/2016   Procedure: UPPER ESOPHAGEAL ENDOSCOPIC ULTRASOUND (EUS);  Surgeon: Toribio SHAUNNA Cedar, MD;  Location: [facility];  Service: Endoscopy;  Laterality: N/A;  radial only-will be done mod if needed   Social History:  reports that she has quit smoking. Her smoking use included cigarettes. She started smoking about 36 years ago. She has never used smokeless tobacco. She reports that she does not drink alcohol  and does not use drugs.  Allergies[1]  Family History  Problem Relation Age of Onset   Heart Problems Mother    Heart defect Mother        valve problems    Heart Problems Father    Heart disease Brother    Lung cancer Daughter        non small cell    Diabetes Maternal Aunt    Diabetes Maternal Uncle    Throat  cancer Brother    Colon cancer Neg Hx    Colon polyps Neg Hx    Esophageal cancer Neg Hx    Rectal cancer Neg Hx    Stomach cancer Neg Hx     Prior to Admission medications  Medication Sig Start Date End Date Taking? Authorizing Provider  albuterol  (PROVENTIL ) (2.5 MG/3ML) 0.083% nebulizer solution Take 3 mLs (2.5 mg total) by nebulization every 2 (two) hours as needed for wheezing or shortness of breath. 05/19/23   Neda Jennet LABOR, MD  albuterol  (VENTOLIN  HFA) 108 (90 Base) MCG/ACT inhaler Inhale 2 puffs into the lungs every 6 (six) hours as needed for wheezing or shortness of breath.    [provider]  amLODipine  (NORVASC ) 5 MG tablet Take 5 mg by mouth in the morning.    [provider]  apixaban  (ELIQUIS ) 2.5 MG TABS tablet Take 2.5 mg by mouth 2 (two) times daily.    [provider]  ascorbic acid  (VITAMIN C ) 1000 MG tablet Take 1,000 mg by mouth daily with lunch.    [provider]  atorvastatin  (LIPITOR) 40 MG tablet TAKE 1 TABLET BY MOUTH DAILY 03/07/24   Jeffrie Oneil BROCKS, MD  benzonatate  (TESSALON ) 100 MG capsule Take 1 capsule (100 mg total) by mouth 3 (three) times daily. 05/02/24   Patel, Pranav M, MD  budesonide -glycopyrrolate -formoterol  (BREZTRI  AEROSPHERE) 160-9-4.8 MCG/ACT AERO inhaler Inhale 2 puffs into the lungs in the morning and at bedtime. 01/20/24   Arrien, Elidia Sieving, MD  busPIRone  (BUSPAR ) 5 MG tablet Take 1 tablet (5 mg total) by mouth 2 (two) times daily. 05/02/24   Tobie Yetta HERO, MD  Cholecalciferol  (VITAMIN D3) 25 MCG (1000 UT) CAPS Take 1,000 Units by mouth daily.    [provider]  cyanocobalamin  (VITAMIN B12) 1000 MCG tablet Take 1,000 mcg by mouth daily.    [provider]  dextromethorphan  (DELSYM ) 30 MG/5ML liquid Take 2.5 mLs (15 mg  total) by mouth 2 (two) times daily. 05/02/24   Tobie Yetta HERO, MD  docusate sodium  (COLACE) 100 MG capsule Take 1 capsule (100 mg total) by mouth 2 (two) times daily as needed for mild constipation or moderate constipation. 03/14/24   Amin, Ankit C, MD  escitalopram  (LEXAPRO ) 10 MG tablet Take 10 mg by mouth daily.    [provider]  fluticasone  (FLONASE ) 50 MCG/ACT nasal spray Place 1 spray into both nostrils 2 (two) times daily as needed for allergies or rhinitis.    [provider]  guaiFENesin  (MUCINEX ) 600 MG 12 hr tablet Take 1 tablet (600 mg total) by mouth 2 (two) times daily. 05/02/24   Tobie Yetta HERO, MD  HYDROcodone -acetaminophen  (NORCO) 7.5-325 MG tablet Take 1 tablet by mouth every 6 (six) hours as needed for moderate pain (pain score 4-6). 04/20/24   [provider]  insulin  lispro (HUMALOG  KWIKPEN) 100 UNIT/ML KwikPen Inject 0-9 Units into the skin: Sliding scale insulin  Less than 70 initiate hypoglycemia protocol;  70-120  0 units, 120-150 1 unit; 151-200 2 units; 201-250 3 units; 251-300 5 units;  301-350 7 units; 351-400 9 units. Greater than 400 call MD 03/24/24   Drusilla Sabas RAMAN, MD  isosorbide  mononitrate (IMDUR ) 60 MG 24 hr tablet Take 1 tablet (60 mg total) by mouth daily. 05/19/23   Jeffrie Oneil BROCKS, MD  LANTUS  SOLOSTAR 100 UNIT/ML Solostar Pen Inject 15 Units into the skin daily before breakfast. 03/24/24   Drusilla Sabas RAMAN, MD  levothyroxine  (SYNTHROID ) 200 MCG tablet Take  100-200 mcg by mouth See admin instructions. Take 1/2 tablet by mouth every Monday and Friday, then take 1 tablet all other days    [provider]  nitroGLYCERIN  (NITROSTAT ) 0.4 MG SL tablet Place 1 tablet (0.4 mg total) under the tongue every 5 (five) minutes as needed for chest pain. 05/19/23   Jeffrie Oneil BROCKS, MD  omeprazole  (PRILOSEC OTC) 20 MG tablet Take 20 mg by mouth daily at 2 PM.    [provider]  OXYGEN  Inhale 3 L into the lungs continuous.    [provider]  Polyethyl Glycol-Propyl Glycol 0.4-0.3 % SOLN Place 1 drop into both eyes as needed (dry eyes, eye irritations.).    [provider]  sevelamer  carbonate (RENVELA ) 800 MG tablet Take 800 mg by mouth 3 (three) times daily with meals.    [provider]  torsemide  (DEMADEX ) 100 MG tablet Take 1 tablet (100 mg total) by mouth 2 (two) times daily. Patient taking differently: Take 100 mg by mouth See admin instructions. Take 1 tablet by mouth twice daily, except Monday and Friday 11/14/22   Lue Elsie BROCKS, MD    Physical Exam: Vitals:   05/29/24 1314 05/29/24 1315 05/29/24 1322 05/29/24 1430  BP:   (!) 124/58 (!) 114/50  Pulse:   95 91  Resp:   (!) 25 16  Temp:   97.8 F (36.6 C)   TempSrc:   Oral   SpO2: 99%  97% 96%  Weight:  74.8 kg    Height:  5' 5 (1.651 m)     Constitutional: Elderly female currently in respiratory distress appears ill Eyes: PERRL, lids and conjunctivae normal ENMT: Mucous membranes are moist.  .Normal dentition.  Neck: normal, supple  Respiratory: Decreased aeration with rales noted in the right lung field.  O2 saturations currently maintained on 3.5 L of oxygen  Cardiovascular: Regular rate and rhythm, no murmurs / rubs / gallops. No extremity edema.   Abdomen: no tenderness, no masses palpated. No hepatosplenomegaly. Bowel sounds positive.  Musculoskeletal: no clubbing / cyanosis. No joint deformity upper and lower extremities. Good ROM, no contractures. Normal muscle tone.  Skin: no rashes, lesions, ulcers. No induration Neurologic: CN 2-12 grossly intact. Sensation intact, DTR normal. Strength 5/5 in all 4.  Psychiatric: Normal judgment and insight. Alert and oriented x 3. Normal mood.   Data Reviewed:  EKG revealed atrial fibrillation at 91 bpm.  Reviewed labs, imaging, and pertinent records this document.  Assessment and Plan:  Acute on chronic respiratory failure with hypoxia Recurrent right-sided pleural  effusion Healthcare associated pneumonia   Patient presents with worsening shortness of breath and cough since yesterday.   O2 saturations noted to be as low as 88% and was placed on 4 L by nasal mask with improvement.  Chest x-ray noted small right pleural effusion with basilar consolidation.  Patient was started on empiric antibiotics Rocephin  and azithromycin .  Just recently hospitalized at the end of last 24 for possible pneumonia and had undergone thoracentesis. - Admit to a telemetry bed - Continuous pulse oximetry with oxygen  to maintain O2 saturation greater than 88- 90% - Incentive spirometry and flutter valve - Check procalcitonin - Continue Rocephin  and azithromycin  - Mucinex  - US  guided thoracentesis if able   Leukocytosis WBC elevated 10.8.  Reactive to above. - Recheck CBC tomorrow morning  Diastolic congestive heart failure Acute on chronic. Last echocardiogram noted with moderate to severe tricuspid regurgitation. - Strict I&O's and daily weights - Check proBNP -  Continue torsemide   ESRD on HD On a Monday, Wednesday, Friday schedule after last hospitalization at the end of last month.  She is due for dialysis today - Dr. Melia of  nephrology consulted for need of dialysis  COPD On exam patient without significant wheezing appreciated at this time. - Albuterol  nebs as needed for shortness of breath/dyspnea - Continue Breztri  inhaler twice daily  Paroxysmal atrial fibrillation Patient in atrial fibrillation but currently rate controlled. - Continue Eliquis   Diabetes mellitus type 2, with long-term use of insulin  Last hemoglobin A1c noted to be 6.2 when checked on 03/11/2024. - Hypoglycemic protocol - CBGs before every meal with sensitive SSI - Adjust insulin  regimen as deemed appropriate  Anemia of chronic disease Hemoglobin noted to be 9.8 which appears around patient's baseline. - Continue to monitor  Hypothyroidism TSH noted to be 6.93 when checked last on  the 04/27/2024 - Continue levothyroxine   DVT prophylaxis: Eliquis  Advance Care Planning:   Code Status: Do not attempt resuscitation (DNR) PRE-ARREST INTERVENTIONS DESIRED    Consults: Nephrology  Family Communication: Husband updated at bedside  Severity of Illness: The appropriate patient status for this patient is INPATIENT. Inpatient status is judged to be reasonable and necessary in order to provide the required intensity of service to ensure the patient's safety. The patient's presenting symptoms, physical exam findings, and initial radiographic and laboratory data in the context of their chronic comorbidities is felt to place them at high risk for further clinical deterioration. Furthermore, it is not anticipated that the patient will be medically stable for discharge from the hospital within 2 midnights of admission.   * I certify that at the point of admission it is my clinical judgment that the patient will require inpatient hospital care spanning beyond 2 midnights from the point of admission due to high intensity of service, high risk for further deterioration and high frequency of surveillance required.*  Author: Maximino DELENA Sharps, MD 05/29/2024 3:22 PM  For on call review [email or web address].     [1]  Allergies Allergen Reactions   Tape Other (See Comments)    SKIN IS VERY THIN- TEARS EASILY!!   Oxycodone  Itching and Other (See Comments)    Pt still takes this med even thought it causes itching   Trelegy Ellipta  [Fluticasone -Umeclidin-Vilant] Nausea And Vomiting, Other (See Comments) and Cough    Made the patient sick to her stomach (only) several days after using it    Trelegy Ellipta  [Fluticasone -Umeclidin-Vilant] Nausea And Vomiting   Trulicity [Dulaglutide] Nausea And Vomiting, Nausea Only, Other (See Comments) and Cough    TRULICITY made the patient sick to her stomach (only) several days after using it   Trulicity [Dulaglutide] Nausea And Vomiting   "

## 2024-05-29 NOTE — Plan of Care (Signed)

## 2024-05-29 NOTE — ED Triage Notes (Signed)
 Pt coming from home with HX of CHF.Complaint of Cough but Dx with PNA about a month ago and now cough has worsen. Dialysis MWF last day was Friday. 3 liters NS at home.Mouth breathing 88%. Simple mask placed by EMS 96/99% on simple mask at 4 liters.  EMS vitals  BP 136/80 Pulse 98 irregular R 22 CBG 250

## 2024-05-29 NOTE — ED Provider Notes (Signed)
 " [facility] Provider Note   CSN: [id number] Arrival date & time: 05/29/24  1303     Patient presents with: Cough   Kayla [name] is a 84 y.o. female.   Patient presents with shortness of breath times several days.  Worse today.  Does have a history of end-stage renal disease and last dialysis was on Friday.  Patient did not go for dialysis today.  States that her cough is been worsening.  No reported fevers.  Denies any vomiting or diarrhea.  Chest tightness with coughing.  States that inactivity makes her more short of breath.  She is on 3 L of oxygen  at home chronically.  EMS was called and patient placed on a mask due to hypoxia.  CBG was 250 and patient transported here       Prior to Admission medications  Medication Sig Start Date End Date Taking? Authorizing Provider  albuterol  (PROVENTIL ) (2.5 MG/3ML) 0.083% nebulizer solution Take 3 mLs (2.5 mg total) by nebulization every 2 (two) hours as needed for wheezing or shortness of breath. 05/19/23   Neda Jennet LABOR, MD  albuterol  (VENTOLIN  HFA) 108 (90 Base) MCG/ACT inhaler Inhale 2 puffs into the lungs every 6 (six) hours as needed for wheezing or shortness of breath.    [provider]  amLODipine  (NORVASC ) 5 MG tablet Take 5 mg by mouth in the morning.    [provider]  apixaban  (ELIQUIS ) 2.5 MG TABS tablet Take 2.5 mg by mouth 2 (two) times daily.    [provider]  ascorbic acid  (VITAMIN C ) 1000 MG tablet Take 1,000 mg by mouth daily with lunch.    [provider]  atorvastatin  (LIPITOR) 40 MG tablet TAKE 1 TABLET BY MOUTH DAILY 03/07/24   Jeffrie Oneil BROCKS, MD  benzonatate  (TESSALON ) 100 MG capsule Take 1 capsule (100 mg total) by mouth 3 (three) times daily. 05/02/24   Patel, Pranav M, MD  budesonide -glycopyrrolate -formoterol  (BREZTRI  AEROSPHERE) 160-9-4.8 MCG/ACT AERO inhaler Inhale 2 puffs into the lungs in the morning and at bedtime. 01/20/24    Arrien, Elidia Sieving, MD  busPIRone  (BUSPAR ) 5 MG tablet Take 1 tablet (5 mg total) by mouth 2 (two) times daily. 05/02/24   Tobie Yetta HERO, MD  Cholecalciferol  (VITAMIN D3) 25 MCG (1000 UT) CAPS Take 1,000 Units by mouth daily.    [provider]  cyanocobalamin  (VITAMIN B12) 1000 MCG tablet Take 1,000 mcg by mouth daily.    [provider]  dextromethorphan  (DELSYM ) 30 MG/5ML liquid Take 2.5 mLs (15 mg total) by mouth 2 (two) times daily. 05/02/24   Tobie Yetta HERO, MD  docusate sodium  (COLACE) 100 MG capsule Take 1 capsule (100 mg total) by mouth 2 (two) times daily as needed for mild constipation or moderate constipation. 03/14/24   Amin, Ankit C, MD  escitalopram  (LEXAPRO ) 10 MG tablet Take 10 mg by mouth daily.    [provider]  fluticasone  (FLONASE ) 50 MCG/ACT nasal spray Place 1 spray into both nostrils 2 (two) times daily as needed for allergies or rhinitis.    [provider]  guaiFENesin  (MUCINEX ) 600 MG 12 hr tablet Take 1 tablet (600 mg total) by mouth 2 (two) times daily. 05/02/24   Tobie Yetta HERO, MD  HYDROcodone -acetaminophen  (NORCO) 7.5-325 MG tablet Take 1 tablet by mouth every 6 (six) hours as needed for moderate pain (pain score 4-6). 04/20/24   [provider]  insulin  lispro (HUMALOG  KWIKPEN) 100 UNIT/ML  KwikPen Inject 0-9 Units into the skin: Sliding scale insulin  Less than 70 initiate hypoglycemia protocol;  70-120  0 units, 120-150 1 unit; 151-200 2 units; 201-250 3 units; 251-300 5 units;  301-350 7 units; 351-400 9 units. Greater than 400 call MD 03/24/24   Drusilla Sabas RAMAN, MD  isosorbide  mononitrate (IMDUR ) 60 MG 24 hr tablet Take 1 tablet (60 mg total) by mouth daily. 05/19/23   Jeffrie Oneil BROCKS, MD  LANTUS  SOLOSTAR 100 UNIT/ML Solostar Pen Inject 15 Units into the skin daily before breakfast. 03/24/24   Drusilla Sabas RAMAN, MD  levothyroxine  (SYNTHROID ) 200 MCG tablet Take 100-200 mcg by mouth See admin instructions. Take 1/2  tablet by mouth every Monday and Friday, then take 1 tablet all other days    [provider]  nitroGLYCERIN  (NITROSTAT ) 0.4 MG SL tablet Place 1 tablet (0.4 mg total) under the tongue every 5 (five) minutes as needed for chest pain. 05/19/23   Jeffrie Oneil BROCKS, MD  omeprazole  (PRILOSEC OTC) 20 MG tablet Take 20 mg by mouth daily at 2 PM.    [provider]  OXYGEN  Inhale 3 L into the lungs continuous.    [provider]  Polyethyl Glycol-Propyl Glycol 0.4-0.3 % SOLN Place 1 drop into both eyes as needed (dry eyes, eye irritations.).    [provider]  sevelamer  carbonate (RENVELA ) 800 MG tablet Take 800 mg by mouth 3 (three) times daily with meals.    [provider]  torsemide  (DEMADEX ) 100 MG tablet Take 1 tablet (100 mg total) by mouth 2 (two) times daily. Patient taking differently: Take 100 mg by mouth See admin instructions. Take 1 tablet by mouth twice daily, except Monday and Friday 11/14/22   Lue Elsie BROCKS, MD    Allergies: Tape, Oxycodone , Trelegy ellipta  [fluticasone -umeclidin-vilant], Trelegy ellipta  [fluticasone -umeclidin-vilant], Trulicity [dulaglutide], and Trulicity [dulaglutide]    Review of Systems  All other systems reviewed and are negative.   Updated Vital Signs BP (!) 124/58 (BP Location: Right Arm)   Pulse 95   Temp 97.8 F (36.6 C) (Oral)   Resp (!) 25   Ht 1.651 m (5' 5)   Wt 74.8 kg   SpO2 97%   BMI 27.46 kg/m   Physical Exam Vitals and nursing note reviewed.  Constitutional:      General: She is not in acute distress.    Appearance: Normal appearance. She is well-developed. She is not toxic-appearing.  HENT:     Head: Normocephalic and atraumatic.  Eyes:     General: Lids are normal.     Conjunctiva/sclera: Conjunctivae normal.     Pupils: Pupils are equal, round, and reactive to light.  Neck:     Thyroid : No thyroid  mass.     Trachea: No tracheal deviation.  Cardiovascular:     Rate and Rhythm:  Normal rate and regular rhythm.     Heart sounds: Normal heart sounds. No murmur heard.    No gallop.  Pulmonary:     Effort: Pulmonary effort is normal. Prolonged expiration present.     Breath sounds: No stridor. Decreased breath sounds and wheezing present. No rhonchi or rales.  Abdominal:     General: There is no distension.     Palpations: Abdomen is soft.     Tenderness: There is no abdominal tenderness. There is no rebound.  Musculoskeletal:        General: No tenderness. Normal range of motion.     Cervical back: Normal range of motion  and neck supple.  Skin:    General: Skin is warm and dry.     Findings: No abrasion or rash.  Neurological:     Mental Status: She is alert and oriented to person, place, and time. Mental status is at baseline.     GCS: GCS eye subscore is 4. GCS verbal subscore is 5. GCS motor subscore is 6.     Cranial Nerves: No cranial nerve deficit.     Sensory: No sensory deficit.     Motor: Motor function is intact.  Psychiatric:        Attention and Perception: Attention normal.        Speech: Speech normal.        Behavior: Behavior normal.     (all labs ordered are listed, but only abnormal results are displayed) Labs Reviewed  RESP PANEL BY RT-PCR (RSV, FLU A&B, COVID)  [id number]  CBC WITH DIFFERENTIAL/PLATELET  COMPREHENSIVE METABOLIC PANEL WITH GFR  I-STAT CHEM 8, ED    EKG: None  Radiology: No results found.   Procedures   Medications Ordered in the ED - No data to display                                  Medical Decision Making Amount and/or Complexity of Data Reviewed Labs: ordered. Radiology: ordered. ECG/medicine tests: ordered.  Risk Prescription drug management.   Patient's chest x-ray shows possible pneumonia.  Started on IV antibiotics.  She has increased oxygen  requirement.  Will admit to the hospitalist team     Final diagnoses:  None    ED Discharge Orders     None          Dasie Faden,  MD 05/29/24 1502  "

## 2024-05-29 NOTE — ED Notes (Signed)
 Pt became SHOB when repositioning. Staff assisted with sliding up in bed and encouraged deep breathing. Pts O2 increased to 4 liters. Sats improved to 93%

## 2024-05-29 NOTE — ED Notes (Signed)
 Unsuccessful IV attempt to lower right arm

## 2024-05-30 ENCOUNTER — Inpatient Hospital Stay (HOSPITAL_COMMUNITY)

## 2024-05-30 ENCOUNTER — Ambulatory Visit: Admitting: Adult Health

## 2024-05-30 DIAGNOSIS — J9621 Acute and chronic respiratory failure with hypoxia: Secondary | ICD-10-CM | POA: Diagnosis not present

## 2024-05-30 LAB — CBC
HCT: 33.4 % — ABNORMAL LOW (ref 36.0–46.0)
Hemoglobin: 9.9 g/dL — ABNORMAL LOW (ref 12.0–15.0)
MCH: 29.8 pg (ref 26.0–34.0)
MCHC: 29.6 g/dL — ABNORMAL LOW (ref 30.0–36.0)
MCV: 100.6 fL — ABNORMAL HIGH (ref 80.0–100.0)
Platelets: 372 10*3/uL (ref 150–400)
RBC: 3.32 MIL/uL — ABNORMAL LOW (ref 3.87–5.11)
RDW: 16.1 % — ABNORMAL HIGH (ref 11.5–15.5)
WBC: 12.3 10*3/uL — ABNORMAL HIGH (ref 4.0–10.5)
nRBC: 0 % (ref 0.0–0.2)

## 2024-05-30 LAB — HEPATITIS PANEL, ACUTE
HCV Ab: NONREACTIVE
Hep A IgM: NONREACTIVE
Hep B C IgM: NONREACTIVE
Hepatitis B Surface Ag: NONREACTIVE

## 2024-05-30 LAB — BASIC METABOLIC PANEL WITH GFR
Anion gap: 12 (ref 5–15)
BUN: 28 mg/dL — ABNORMAL HIGH (ref 8–23)
CO2: 28 mmol/L (ref 22–32)
Calcium: 9 mg/dL (ref 8.9–10.3)
Chloride: 97 mmol/L — ABNORMAL LOW (ref 98–111)
Creatinine, Ser: 2.4 mg/dL — ABNORMAL HIGH (ref 0.44–1.00)
GFR, Estimated: 19 mL/min — ABNORMAL LOW
Glucose, Bld: 83 mg/dL (ref 70–99)
Potassium: 4.2 mmol/L (ref 3.5–5.1)
Sodium: 137 mmol/L (ref 135–145)

## 2024-05-30 LAB — GLUCOSE, CAPILLARY
Glucose-Capillary: 97 mg/dL (ref 70–99)
Glucose-Capillary: 134 mg/dL — ABNORMAL HIGH (ref 70–99)
Glucose-Capillary: 93 mg/dL (ref 70–99)
Glucose-Capillary: 176 mg/dL — ABNORMAL HIGH (ref 70–99)
Glucose-Capillary: 185 mg/dL — ABNORMAL HIGH (ref 70–99)

## 2024-05-30 MED ORDER — LIDOCAINE HCL 1 % IJ SOLN
INTRAMUSCULAR | Status: AC
  Filled 2024-05-30: qty 20

## 2024-05-30 MED ORDER — LIDOCAINE HCL 1 % IJ SOLN
20.0000 mL | Freq: Once | INTRAMUSCULAR | Status: AC
  Administered 2024-05-30: 10 mL

## 2024-05-30 MED ORDER — METOPROLOL TARTRATE 5 MG/5ML IV SOLN
5.0000 mg | INTRAVENOUS | Status: DC | PRN
  Filled 2024-05-30: qty 5

## 2024-05-30 MED ORDER — HEPARIN SODIUM (PORCINE) 1000 UNIT/ML IJ SOLN
INTRAMUSCULAR | Status: AC
  Filled 2024-05-30: qty 4

## 2024-05-30 MED ORDER — NITROGLYCERIN 1 MG/10 ML FOR IR/CATH LAB
INTRA_ARTERIAL | Status: AC
  Filled 2024-05-30: qty 10

## 2024-05-30 MED ORDER — HYDRALAZINE HCL 20 MG/ML IJ SOLN: 10.0000 mg | INTRAMUSCULAR | Status: DC | PRN

## 2024-05-30 MED ORDER — DOXERCALCIFEROL 2.5 MCG PO CAPS
7.0000 ug | ORAL_CAPSULE | ORAL | Status: DC
  Administered 2024-05-31: 7 ug via ORAL
  Filled 2024-05-30: qty 4

## 2024-05-30 MED ORDER — DARBEPOETIN ALFA 100 MCG/0.5ML IJ SOSY
100.0000 ug | PREFILLED_SYRINGE | Freq: Once | INTRAMUSCULAR | Status: AC
  Administered 2024-05-30: 100 ug via SUBCUTANEOUS
  Filled 2024-05-30 (×2): qty 0.5

## 2024-05-30 NOTE — Progress Notes (Signed)
 " Kayla [name]  FMW:[id number] DOB: Sep 12, 1940 DOA: 05/29/2024 PCP: Stephanie Charlene CROME, MD    Brief Narrative:   84 year old with history of HTN, HLD, CAD, paroxysmal A-fib, sick sinus syndrome status post pacemaker, ESRD on HD MWF, chronic hypoxic respiratory failure on 3 L nasal cannula admitted for increasing shortness of breath.  Admitted to the hospital around Christmas found to have large right-sided pleural effusion and COPD exacerbation treated with Unasyn  for bilateral pneumonia underwent thoracentesis noted to have transudative fluid.  Eventually discharged on Christmas Eve.  Now presents back again.  Chest x-ray showing concerns of possible pneumonia versus atelectasis therefore started on antibiotics.  Assessment & Plan:   Acute on chronic respiratory failure with hypoxia Recurrent right-sided pleural effusion, small Healthcare associated pneumonia   At home uses 3 L nasal cannula, currently requiring 4 L nasal cannula.  Chest x-ray showing some pleural effusion with signs of mild volume overload.  Procalcitonin 0.23.  Continue bronchodilators, I-S/flutter valve.  Continue empiric antibiotics. -Ultrasound thoracentesis 1/27-1150 cc of dark amber fluid   Leukocytosis Continue to monitor on antibiotics   Diastolic congestive heart failure with preserved EF Echocardiogram in September 2025 showed EF of 55%, grade 2 DD, moderately elevated PASP, moderate to severe TR. nephrology managing fluid   ESRD on HD Nephrology follwing.    COPD -Continue home bronchodilators   Paroxysmal atrial fibrillation Patient in atrial fibrillation but currently rate controlled. - Continue Eliquis    Diabetes mellitus type 2, with long-term use of insulin  A1c 6 point.  Sliding scale and Accu-Cheks   Anemia of chronic disease Hemoglobin noted to be 9.8 which appears around patient's baseline. - Continue to monitor   Hypothyroidism Continue Synthroid  repeat outpatient  TSH with PCP   DVT prophylaxis: apixaban  (ELIQUIS ) tablet 2.5 mg Start: 05/29/24 2200 apixaban  (ELIQUIS ) tablet 2.5 mg      Code Status: Do not attempt resuscitation (DNR) PRE-ARREST INTERVENTIONS DESIRED Family Communication:   Status is: Inpatient Remains inpatient appropriate because: On going vol management.    PT Follow up Recs:   Subjective:  Did well after thoracentesis today Feels little better today  Examination:  General exam: Appears calm and comfortable  Respiratory system: Clear to auscultation. Respiratory effort normal. Cardiovascular system: S1 & S2 heard, RRR. No JVD, murmurs, rubs, gallops or clicks. No pedal edema. Gastrointestinal system: Abdomen is nondistended, soft and nontender. No organomegaly or masses felt. Normal bowel sounds heard. Central nervous system: Alert and oriented. No focal neurological deficits. Extremities: Symmetric 5 x 5 power. Skin: No rashes, lesions or ulcers Psychiatry: Judgement and insight appear normal. Mood & affect appropriate.                Diet Orders (From admission, onward)     Start     Ordered   05/29/24 1624  Diet renal/carb modified with fluid restriction Diet-HS Snack? Nothing; Fluid restriction: 1200 mL Fluid; Room service appropriate? Yes; Fluid consistency: Thin  Diet effective now       Question Answer Comment  Diet-HS Snack? Nothing   Fluid restriction: 1200 mL Fluid   Room service appropriate? Yes   Fluid consistency: Thin      05/29/24 1623            Objective: Vitals:   05/30/24 0330 05/30/24 0452 05/30/24 0738 05/30/24 1202  BP: (!) 113/57 (!) 101/49 122/63 (!) 119/46  Pulse: (!) 109 91 (!) 58 (!) 105  Resp:  16 18 18  Temp:  97.9 F (36.6 C) (!) 97.4 F (36.3 C) 98.8 F (37.1 C)  TempSrc:  Axillary    SpO2:  99% 95% 92%  Weight:      Height:        Intake/Output Summary (Last 24 hours) at 05/30/2024 1320 Last data filed at 05/30/2024 0700 Gross per 24 hour  Intake 118  ml  Output 2200 ml  Net -2082 ml   Filed Weights   05/29/24 1315  Weight: 74.8 kg    Scheduled Meds:  apixaban   2.5 mg Oral BID   azithromycin   500 mg Oral Daily   budesonide -glycopyrrolate -formoterol   2 puff Inhalation BID   Chlorhexidine  Gluconate Cloth  6 each Topical Q0600   darbepoetin (ARANESP ) injection - DIALYSIS  100 mcg Subcutaneous Once   [START ON 05/31/2024] doxercalciferol   7 mcg Oral Q M,W,F   insulin  aspart  0-5 Units Subcutaneous QHS   insulin  aspart  0-9 Units Subcutaneous TID WC   [START ON 06/02/2024] levothyroxine   100 mcg Oral Once per day on Monday Friday   And   levothyroxine   200 mcg Oral Once per day on Sunday Tuesday Wednesday Thursday Saturday   sodium chloride  flush  3 mL Intravenous Q12H   torsemide   100 mg Oral BID   Continuous Infusions:  cefTRIAXone  (ROCEPHIN )  IV Stopped (05/29/24 1731)    Nutritional status     Body mass index is 27.46 kg/m.  Data Reviewed:   CBC: Recent Labs  Lab 05/29/24 1408 05/29/24 1410 05/30/24 0712  WBC 10.8*  --  12.3*  NEUTROABS 8.9*  --   --   HGB 9.8* 11.2* 9.9*  HCT 34.0* 33.0* 33.4*  MCV 103.3*  --  100.6*  PLT 385  --  372   Basic Metabolic Panel: Recent Labs  Lab 05/29/24 1408 05/29/24 1410 05/30/24 0712  NA 142 142 137  K 5.3* 5.1 4.2  CL 103 105 97*  CO2 25  --  28  GLUCOSE 208* 200* 83  BUN 60* 62* 28*  CREATININE 3.91* 4.20* 2.40*  CALCIUM  9.3  --  9.0   GFR: Estimated Creatinine Clearance: 18 mL/min (A) (by C-G formula based on SCr of 2.4 mg/dL (H)). Liver Function Tests: Recent Labs  Lab 05/29/24 1408  AST 12*  ALT 9  ALKPHOS 81  BILITOT 0.3  PROT 7.0  ALBUMIN  3.3*   No results for input(s): LIPASE, AMYLASE in the last 168 hours. No results for input(s): AMMONIA in the last 168 hours. Coagulation Profile: No results for input(s): INR, PROTIME in the last 168 hours. Cardiac Enzymes: No results for input(s): CKTOTAL, CKMB, CKMBINDEX, TROPONINI in the  last 168 hours. BNP (last 3 results) Recent Labs    04/27/24 2157 05/29/24 1408  PROBNP 9,246.0* 9,768.0*   HbA1C: No results for input(s): HGBA1C in the last 72 hours. CBG: Recent Labs  Lab 05/29/24 1741 05/29/24 2048 05/30/24 0838 05/30/24 1159  GLUCAP 166* 128* 97 134*   Lipid Profile: No results for input(s): CHOL, HDL, LDLCALC, TRIG, CHOLHDL, LDLDIRECT in the last 72 hours. Thyroid  Function Tests: No results for input(s): TSH, T4TOTAL, FREET4, T3FREE, THYROIDAB in the last 72 hours. Anemia Panel: No results for input(s): VITAMINB12, FOLATE, FERRITIN, TIBC, IRON, RETICCTPCT in the last 72 hours. Sepsis Labs: Recent Labs  Lab 05/29/24 1408  PROCALCITON 0.23    Recent Results (from the past 240 hours)  Resp panel by RT-PCR (RSV, Flu A&B, Covid) Anterior Nasal Swab     Status: None  Collection Time: 05/29/24  1:39 PM   Specimen: Anterior Nasal Swab  Result Value Ref Range Status   SARS Coronavirus 2 by RT PCR NEGATIVE NEGATIVE Final   Influenza A by PCR NEGATIVE NEGATIVE Final   Influenza B by PCR NEGATIVE NEGATIVE Final    Comment: (NOTE) The Xpert Xpress SARS-CoV-2/FLU/RSV plus assay is intended as an aid in the diagnosis of influenza from Nasopharyngeal swab specimens and should not be used as a sole basis for treatment. Nasal washings and aspirates are unacceptable for Xpert Xpress SARS-CoV-2/FLU/RSV testing.  Fact Sheet for Patients: bloggercourse.com  Fact Sheet for Healthcare Providers: seriousbroker.it  This test is not yet approved or cleared by the [place]  FDA and has been authorized for detection and/or diagnosis of SARS-CoV-2 by FDA under an Emergency Use Authorization (EUA). This EUA will remain in effect (meaning this test can be used) for the duration of the COVID-19 declaration under Section 564(b)(1) of the Act, 21 U.S.C. section 360bbb-3(b)(1),  unless the authorization is terminated or revoked.     Resp Syncytial Virus by PCR NEGATIVE NEGATIVE Final    Comment: (NOTE) Fact Sheet for Patients: bloggercourse.com  Fact Sheet for Healthcare Providers: seriousbroker.it  This test is not yet approved or cleared by the [place]  FDA and has been authorized for detection and/or diagnosis of SARS-CoV-2 by FDA under an Emergency Use Authorization (EUA). This EUA will remain in effect (meaning this test can be used) for the duration of the COVID-19 declaration under Section 564(b)(1) of the Act, 21 U.S.C. section 360bbb-3(b)(1), unless the authorization is terminated or revoked.  Performed at [facility], [address] [place], [place] [address]          Radiology Studies: DG Chest 1 View Result Date: 05/30/2024 EXAM: 1 VIEW XRAY OF THE CHEST 05/30/2024 10:56:00 AM COMPARISON: 05/29/2024 CLINICAL HISTORY: Status post thoracentesis. FINDINGS: LINES, TUBES AND DEVICES: Right chest central venous catheter in place with tip at superior cavoatrial junction. LUNGS AND PLEURA: Decreased right pleural effusion with small residual. Persistent right basilar opacities. No pneumothorax. HEART AND MEDIASTINUM: Stable cardiomegaly. Atherosclerotic calcifications noted. BONES AND SOFT TISSUES: Old left rib fractures. Cervical spinal surgical hardware noted. Surgical clips noted. IMPRESSION: 1. Decreased right pleural effusion with small residual. No pneumothorax identified following thoracentesis. 2. Persistent right basilar opacities. Electronically signed by: Waddell Calk MD 05/30/2024 11:28 AM EST RP Workstation: [id number]   IR THORACENTESIS RIGHT ASP PLEURAL SPACE W/IMG GUIDE Result Date: 05/30/2024 INDICATION: 84 year old female. Endorsing shortness of breath. Found to have a right-sided pleural effusion. Request for a therapeutic right-sided thoracentesis. EXAM: ULTRASOUND GUIDED  THERAPEUTIC RIGHT-SIDED THORACENTESIS MEDICATIONS: Lidocaine  1% 10 mL COMPLICATIONS: None immediate. PROCEDURE: An ultrasound guided thoracentesis was thoroughly discussed with the patient and questions answered. The benefits, risks, alternatives and complications were also discussed. The patient understands and wishes to proceed with the procedure. Written consent was obtained. Ultrasound was performed to localize and mark an adequate pocket of fluid in the right chest. The area was then prepped and draped in the normal sterile fashion. 1% Lidocaine  was used for local anesthesia. Under ultrasound guidance a 6 Fr Safe-T-Centesis catheter was introduced. Thoracentesis was performed. The catheter was removed and a dressing applied. FINDINGS: A total of approximately 950 mL of dark amber fluid was removed. IMPRESSION: Successful ultrasound guided therapeutic right-sided thoracentesis yielding 950 mL of dark amber pleural fluid. Performed by Delon Beagle NP Electronically Signed   By: Juliene Balder M.D.   On: 05/30/2024 10:53  DG Chest Port 1 View Result Date: 05/29/2024 CLINICAL DATA:  sob EXAM: PORTABLE CHEST 1 VIEW COMPARISON:  Chest XR, 04/28/2024.  CT chest, 04/29/2024. FINDINGS: Support lines; RIGHT chest dialysis catheter, well-positioned with catheter tip at the superior cavoatrial junction. Aortic arch atherosclerosis. The RIGHT heart border is obscured. Cardiac silhouette is otherwise within normal limits. The LEFT lung is well inflated. Suspect at least small volume RIGHT pleural effusion, with similar appearance of RIGHT basilar consolidation silhouetting the hemidiaphragm. No pneumothorax. Thyroidectomy clips. Cervical fusion hardware. No acute displaced fracture. IMPRESSION: 1. Small volume pleural effusion with RIGHT basilar consolidation. Findings likely to represent atelectasis, however superimposed pneumonia can appear similar. 2.  Aortic Atherosclerosis (ICD10-I70.0). Electronically Signed    By: Thom Hall M.D.   On: 05/29/2024 14:03           LOS: 1 day   Time spent= 35 mins    Burgess JAYSON Dare, MD [facility]  If 7PM-7AM, please contact night-coverage  05/30/2024, 1:20 PM  "

## 2024-05-30 NOTE — Progress Notes (Signed)
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 ACCESS RIGHT  11/04/2022   JOINT REPLACEMENT     LEAD EXTRACTION N/A 09/08/2022   Procedure: LEAD EXTRACTION;  Surgeon: Waddell Danelle ORN, MD;  Location: [facility];  Service: Cardiovascular;  Laterality: N/A;   LEAD EXTRACTION N/A 09/11/2022   Procedure: LEAD EXTRACTION;  Surgeon: Waddell Danelle ORN, MD;  Location: [facility];  Service: Cardiovascular;  Laterality: N/A;   LIGATION OF COMPETING BRANCHES OF ARTERIOVENOUS FISTULA Left 02/14/2024   Procedure: LIGATION OF COMPETING BRANCHES OF ARTERIOVENOUS FISTULA;  Surgeon: Lanis Fonda BRAVO, MD;  Location: [facility] OR;  Service: Vascular;   Laterality: Left;   LUMBAR DISC SURGERY  1990's  x 2   REVISON OF ARTERIOVENOUS FISTULA Left 02/14/2024   Procedure: REVISON OF ARTERIOVENOUS FISTULA;  Surgeon: Lanis Fonda BRAVO, MD;  Location: [facility] OR;  Service: Vascular;  Laterality: Left;   TEE WITHOUT CARDIOVERSION N/A 09/07/2022   Procedure: TRANSESOPHAGEAL ECHOCARDIOGRAM;  Surgeon: Santo Stanly LABOR, MD;  Location: [facility];  Service: Cardiovascular;  Laterality: N/A;   THYROIDECTOMY  2011   TOTAL KNEE ARTHROPLASTY  07/27/2011   Procedure: TOTAL KNEE ARTHROPLASTY;  Surgeon: Donnice JONETTA Car, MD;  Location: [facility];  Service: Orthopedics;  Laterality: Left;   UPPER ESOPHAGEAL ENDOSCOPIC ULTRASOUND (EUS) N/A 05/14/2016   Procedure: UPPER ESOPHAGEAL ENDOSCOPIC ULTRASOUND (EUS);  Surgeon: Toribio SHAUNNA Cedar, MD;  Location: [facility];  Service: Endoscopy;  Laterality: N/A;  radial only-will be done mod if needed    Allergies: Allergies[1]  Medications:   Prior to Admission medications  Medication Sig Start Date End Date Taking? Authorizing Provider  albuterol  (PROVENTIL ) (2.5 MG/3ML) 0.083% nebulizer solution Take 3 mLs (2.5 mg total) by nebulization every 2 (two) hours as needed for wheezing or shortness of breath. 05/19/23   Neda Jennet LABOR, MD  albuterol  (VENTOLIN  HFA) 108 (90 Base) MCG/ACT inhaler Inhale 2 puffs into the lungs every 6 (six) hours as needed for wheezing or shortness of breath.    [provider]  amLODipine  (NORVASC ) 5 MG tablet Take 5 mg by mouth in the morning.    [provider]  apixaban  (ELIQUIS ) 2.5 MG TABS tablet Take 2.5 mg by mouth 2 (two) times daily.    [provider]  ascorbic acid  (VITAMIN C ) 1000 MG tablet Take 1,000 mg by mouth daily with lunch.    [provider]  atorvastatin  (LIPITOR) 40 MG tablet TAKE 1 TABLET BY MOUTH DAILY 03/07/24   Jeffrie Oneil BROCKS, MD  benzonatate  (TESSALON ) 100 MG capsule Take 1 capsule (100 mg total) by mouth 3 (three) times daily.  05/02/24   Tobie Yetta HERO, MD  budesonide -glycopyrrolate -formoterol  (BREZTRI  AEROSPHERE) 160-9-4.8 MCG/ACT AERO inhaler Inhale 2 puffs into the lungs in the morning and at bedtime. 01/20/24   Arrien, Elidia Toribio, MD  busPIRone  (BUSPAR ) 5 MG tablet Take 1 tablet (5 mg total) by mouth 2 (two) times daily. 05/02/24   Tobie Yetta HERO, MD  Cholecalciferol  (VITAMIN D3) 25 MCG (1000 UT) CAPS Take 1,000 Units by mouth daily.    [provider]  cyanocobalamin  (VITAMIN B12) 1000 MCG tablet Take 1,000 mcg by mouth daily.    [provider]  dextromethorphan  (DELSYM ) 30 MG/5ML liquid Take 2.5 mLs (15 mg total) by mouth 2 (two) times daily. 05/02/24   Tobie Yetta HERO, MD  docusate sodium  (COLACE) 100 MG capsule Take 1 capsule (100 mg total) by mouth 2 (two) times daily as needed for mild constipation or moderate constipation. 03/14/24   Amin, Ankit C, MD  escitalopram  (LEXAPRO ) 10 MG tablet Take 10 mg by mouth daily.    [provider]  fluticasone  (FLONASE ) 50 MCG/ACT nasal spray Place 1 spray into both nostrils 2 (two) times daily as needed for allergies or rhinitis.    [provider]  guaiFENesin  (MUCINEX ) 600 MG 12 hr tablet Take 1 tablet (600 mg total) by mouth 2 (two) times daily. 05/02/24   Tobie Yetta HERO, MD  HYDROcodone -acetaminophen  (NORCO) 7.5-325 MG tablet Take 1 tablet by mouth every 6 (six) hours as needed for moderate pain (pain score 4-6). 04/20/24   [provider]  insulin  lispro (HUMALOG  KWIKPEN) 100 UNIT/ML KwikPen  Inject 0-9 Units into the skin: Sliding scale insulin  Less than 70 initiate hypoglycemia protocol;  70-120  0 units, 120-150 1 unit; 151-200 2 units; 201-250 3 units; 251-300 5 units;  301-350 7 units; 351-400 9 units. Greater than 400 call MD 03/24/24   Drusilla Sabas RAMAN, MD  isosorbide  mononitrate (IMDUR ) 60 MG 24 hr tablet Take 1 tablet (60 mg total) by mouth daily. 05/19/23   Jeffrie Oneil BROCKS, MD  LANTUS  SOLOSTAR 100 UNIT/ML Solostar  Pen Inject 15 Units into the skin daily before breakfast. 03/24/24   Drusilla Sabas RAMAN, MD  levothyroxine  (SYNTHROID ) 200 MCG tablet Take 100-200 mcg by mouth See admin instructions. Take 1/2 tablet by mouth every Monday and Friday, then take 1 tablet all other days    [provider]  nitroGLYCERIN  (NITROSTAT ) 0.4 MG SL tablet Place 1 tablet (0.4 mg total) under the tongue every 5 (five) minutes as needed for chest pain. 05/19/23   Jeffrie Oneil BROCKS, MD  omeprazole  (PRILOSEC OTC) 20 MG tablet Take 20 mg by mouth daily at 2 PM.    [provider]  OXYGEN  Inhale 3 L into the lungs continuous.    [provider]  Polyethyl Glycol-Propyl Glycol 0.4-0.3 % SOLN Place 1 drop into both eyes as needed (dry eyes, eye irritations.).    [provider]  sevelamer  carbonate (RENVELA ) 800 MG tablet Take 800 mg by mouth 3 (three) times daily with meals.    [provider]  torsemide  (DEMADEX ) 100 MG tablet Take 1 tablet (100 mg total) by mouth 2 (two) times daily. Patient taking differently: Take 100 mg by mouth See admin instructions. Take 1 tablet by mouth twice daily, except Monday and Friday 11/14/22   Lue Elsie BROCKS, MD    Discontinued Meds:   Medications Discontinued During This Encounter  Medication Reason   albuterol  (PROVENTIL ) (2.5 MG/3ML) 0.083% nebulizer solution 2.5 mg    Polyethyl Glycol-Propyl Glycol 0.4-0.3 % SOLN 1 drop P&T Policy: Therapeutic Substitute   levothyroxine  (SYNTHROID ) tablet 100-200 mcg     Social History:  reports that she has quit smoking. Her smoking use included cigarettes. She started smoking about 36 years ago. She has never used smokeless tobacco. She reports that she does not drink alcohol  and does not use drugs.  Family History:   Family History  Problem Relation Age of Onset   Heart Problems Mother    Heart defect Mother        valve problems   Heart Problems Father    Heart disease Brother    Lung cancer Daughter         non small cell    Diabetes Maternal Aunt    Diabetes Maternal Uncle    Throat cancer Brother    Colon cancer Neg Hx    Colon polyps Neg Hx    Esophageal cancer Neg Hx    Rectal cancer Neg Hx    Stomach cancer Neg Hx     Blood pressure 122/63, pulse (!) 58, temperature (!) 97.4 F (36.3 C), resp. rate 18, height 5' 5 (1.651 m), weight 74.8 kg, SpO2 95%. Physical Exam: General elderly female in bed in NAD HEENT NCAT Neck supple trachea midline Lungs poor air movement Heart S1S2 no rub; harsh murmur Abdomen soft nontender nondistended Extremities no edema  Neuro - awake and conversant  Access RIJ tunn catheter in place; LUE BCF thrill present with bruising, torn skin      Sayda Grable, LYNWOOD ORN, MD 05/30/2024, 9:00  AM      [1]  Allergies Allergen Reactions   Tape Other (See Comments)    SKIN IS VERY THIN- TEARS EASILY!!   Oxycodone  Itching and Other (See Comments)    Pt still takes this med even thought it causes itching   Trelegy Ellipta  [Fluticasone -Umeclidin-Vilant] Nausea And Vomiting, Other (See Comments) and Cough    Made the patient sick to her stomach (only) several days after using it    Trelegy Ellipta  [Fluticasone -Umeclidin-Vilant] Nausea And Vomiting   Trulicity [Dulaglutide] Nausea And Vomiting, Nausea Only, Other (See Comments) and Cough    TRULICITY made the patient sick to her stomach (only) several days after using it   Trulicity [Dulaglutide] Nausea And Vomiting

## 2024-05-30 NOTE — Evaluation (Signed)
 Physical Therapy Evaluation Patient Details Name: Kayla [name] MRN: [id number] DOB: April 24, 1941 Today's Date: 05/30/2024  History of Present Illness  84 y.o. female [facility] hospital on 05/29/2024 with SOB and cough. Chest x-ray with R basilar consolidation. Pt underwent thoracentesis on 1/27. PMH includes HTN, HLD, CAD, PAF, sinus sick syndrome s/p PPM, ESRD, chronic respiratory failure on 3L [place].  Clinical Impression  Pt presents to PT with deficits in strength, power, gait, balance, endurance, and cardiopulmonary function. Pt is able to ambulate for short household distances with support of a rollator, taking multiple seated rest breaks and demonstrating good energy conservation strategies. Pt reports she has often been wearing 4L [place] when mobilizing at home and sats 91% and higher when ambulating on 4L during this session. PT recommends frequent mobilization during this admission as well as HHPT at the time of discharge.        If plan is discharge home, recommend the following: A little help with walking and/or transfers;A little help with bathing/dressing/bathroom;Assistance with cooking/housework;Help with stairs or ramp for entrance;Assist for transportation   Can travel by private vehicle        Equipment Recommendations None recommended by PT  Recommendations for Other Services       Functional Status Assessment Patient has had a recent decline in their functional status and demonstrates the ability to make significant improvements in function in a reasonable and predictable amount of time.     Precautions / Restrictions Precautions Precautions: Fall;Other (comment) Recall of Precautions/Restrictions: Intact Precaution/Restrictions Comments: monitor SpO2 Restrictions Weight Bearing Restrictions Per Provider Order: No      Mobility  Bed Mobility Overal bed mobility: Needs Assistance Bed Mobility: Supine to Sit     Supine to sit: Min assist           Transfers Overall transfer level: Needs assistance Equipment used: Rollator (4 wheels) Transfers: Sit to/from Stand, Bed to chair/wheelchair/BSC Sit to Stand: Contact guard assist   Step pivot transfers: Contact guard assist            Ambulation/Gait Ambulation/Gait assistance: Supervision Gait Distance (Feet): 50 Feet (additional trials of 15' and 25;) Assistive device: Rollator (4 wheels) Gait Pattern/deviations: Step-through pattern Gait velocity: reduced Gait velocity interpretation: <1.31 ft/sec, indicative of household ambulator   General Gait Details: slowed step-through Insurance Risk Surveyor     Tilt Bed    Modified Rankin (Stroke Patients Only)       Balance Overall balance assessment: Needs assistance Sitting-balance support: No upper extremity supported, Feet supported Sitting balance-Leahy Scale: Good     Standing balance support: Bilateral upper extremity supported, Reliant on assistive device for balance Standing balance-Leahy Scale: Poor                               Pertinent Vitals/Pain Pain Assessment Pain Assessment: No/denies pain    Home Living Family/patient expects to be discharged to:: Private residence Living Arrangements: Spouse/significant other Available Help at Discharge: Family;Available 24 hours/day Type of Home: House Home Access: Ramped entrance       Home Layout: One level Home Equipment: Tub bench;Rollator (4 wheels);Wheelchair - Nurse, [facility] (2 wheels)      Prior Function Prior Level of Function : Needs assist             Mobility Comments: ambulatory with a rollator for household distances ADLs  Comments: assistance for lower body dressing     Extremity/Trunk Assessment   Upper Extremity Assessment Upper Extremity Assessment: Overall WFL for tasks assessed    Lower Extremity Assessment Lower Extremity Assessment: Generalized weakness     Cervical / Trunk Assessment Cervical / Trunk Assessment: Kyphotic  Communication   Communication Communication: Impaired Factors Affecting Communication: Hearing impaired    Cognition Arousal: Alert Behavior During Therapy: WFL for tasks assessed/performed   PT - Cognitive impairments: Memory, Problem solving                         Following commands: Intact       Cueing Cueing Techniques: Verbal cues     General Comments General comments (skin integrity, edema, etc.): pt on 3L [place] upon PT arrival, pt requests 4L [place] for mobility as she reports she has been utilizing 4L when ambulating at times. SpO2 91% and greater with activity    Exercises     Assessment/Plan    PT Assessment Patient needs continued PT services  PT Problem List Decreased strength;Decreased activity tolerance;Decreased balance;Decreased mobility;Decreased knowledge of use of DME;Cardiopulmonary status limiting activity       PT Treatment Interventions DME instruction;Gait training;Stair training;Functional mobility training;Therapeutic activities;Balance training;Neuromuscular re-education;Therapeutic exercise;Cognitive remediation;Patient/family education    PT Goals (Current goals can be found in the Care Plan section)  Acute Rehab PT Goals Patient Stated Goal: to return home PT Goal Formulation: With patient Time For Goal Achievement: 06/13/24 Potential to Achieve Goals: Good Additional Goals Additional Goal #1: Pt will report 1/4 DOE or less when ambulating for >150' to demonstrate improved activity tolerance    Frequency Min 2X/week     Co-evaluation               AM-PAC PT 6 Clicks Mobility  Outcome Measure Help needed turning from your back to your side while in a flat bed without using bedrails?: A Little Help needed moving from lying on your back to sitting on the side of a flat bed without using bedrails?: A Little Help needed moving to and from a bed to a chair  (including a wheelchair)?: A Little Help needed standing up from a chair using your arms (e.g., wheelchair or bedside chair)?: A Little Help needed to walk in hospital room?: A Little Help needed climbing 3-5 steps with a railing? : A Lot 6 Click Score: 17    End of Session Equipment Utilized During Treatment: Gait belt;Oxygen  Activity Tolerance: Patient limited by fatigue Patient left: in chair;with call bell/phone within reach;with chair alarm set Nurse Communication: Mobility status PT Visit Diagnosis: Other abnormalities of gait and mobility (R26.89);Muscle weakness (generalized) (M62.81)    Time: 8579-8554 PT Time Calculation (min) (ACUTE ONLY): 25 min   Charges:   PT Evaluation $PT Eval Low Complexity: 1 Low   PT General Charges $$ ACUTE PT VISIT: 1 Visit         Bernardino JINNY Ruth, PT, DPT Acute Rehabilitation Office [phone]   Bernardino JINNY Ruth 05/30/2024, 5:02 PM

## 2024-05-30 NOTE — Plan of Care (Signed)

## 2024-05-30 NOTE — Hospital Course (Addendum)
 Brief Narrative:   84 year old with history of HTN, HLD, CAD, paroxysmal A-fib, sick sinus syndrome status post pacemaker, ESRD on HD MWF, chronic hypoxic respiratory failure on 3 L nasal cannula admitted for increasing shortness of breath.  Admitted to the hospital around Christmas found to have large right-sided pleural effusion and COPD exacerbation treated with Unasyn  for bilateral pneumonia underwent thoracentesis noted to have transudative fluid.  Eventually discharged on Christmas Eve.  Now presents back again.  Chest x-ray showing concerns of possible pneumonia versus atelectasis therefore started on antibiotics.  Assessment & Plan:   Acute on chronic respiratory failure with hypoxia Recurrent right-sided pleural effusion, small Healthcare associated pneumonia   At home uses 3 L nasal cannula, currently requiring 4 L nasal cannula.  Chest x-ray showing some pleural effusion with signs of mild volume overload.  Procalcitonin 0.23.  Continue bronchodilators, I-S/flutter valve.  Continue empiric antibiotics. -Ultrasound thoracentesis 1/27-1150 cc of dark amber fluid   Leukocytosis Continue to monitor on antibiotics   Diastolic congestive heart failure with preserved EF Echocardiogram in September 2025 showed EF of 55%, grade 2 DD, moderately elevated PASP, moderate to severe TR. nephrology managing fluid   ESRD on HD Nephrology follwing.    COPD -Continue home bronchodilators   Paroxysmal atrial fibrillation Patient in atrial fibrillation but currently rate controlled. - Continue Eliquis    Diabetes mellitus type 2, with long-term use of insulin  A1c 6 point.  Sliding scale and Accu-Cheks   Anemia of chronic disease Hemoglobin noted to be 9.8 which appears around patient's baseline. - Continue to monitor   Hypothyroidism Continue Synthroid  repeat outpatient TSH with PCP   DVT prophylaxis: apixaban  (ELIQUIS ) tablet 2.5 mg Start: 05/29/24 2200 apixaban  (ELIQUIS ) tablet 2.5  mg      Code Status: Do not attempt resuscitation (DNR) PRE-ARREST INTERVENTIONS DESIRED Family Communication:   Status is: Inpatient Remains inpatient appropriate because: On going vol management.    PT Follow up Recs:   Subjective:  Did well after thoracentesis today Feels little better today  Examination:  General exam: Appears calm and comfortable  Respiratory system: Clear to auscultation. Respiratory effort normal. Cardiovascular system: S1 & S2 heard, RRR. No JVD, murmurs, rubs, gallops or clicks. No pedal edema. Gastrointestinal system: Abdomen is nondistended, soft and nontender. No organomegaly or masses felt. Normal bowel sounds heard. Central nervous system: Alert and oriented. No focal neurological deficits. Extremities: Symmetric 5 x 5 power. Skin: No rashes, lesions or ulcers Psychiatry: Judgement and insight appear normal. Mood & affect appropriate.

## 2024-05-30 NOTE — Procedures (Signed)
 Ultrasound-guided therapeutic right sided thoracentesis performed yielding 950 ml  of dark amber colored fluid. No immediate complications. Follow-up chest x-ray pending. EBL is < 2 ml .

## 2024-05-30 NOTE — Plan of Care (Signed)

## 2024-05-31 ENCOUNTER — Encounter (HOSPITAL_COMMUNITY): Payer: Self-pay | Admitting: Internal Medicine

## 2024-05-31 ENCOUNTER — Inpatient Hospital Stay (HOSPITAL_COMMUNITY)

## 2024-05-31 ENCOUNTER — Other Ambulatory Visit (HOSPITAL_COMMUNITY): Payer: Self-pay

## 2024-05-31 DIAGNOSIS — J9621 Acute and chronic respiratory failure with hypoxia: Secondary | ICD-10-CM | POA: Diagnosis not present

## 2024-05-31 LAB — BASIC METABOLIC PANEL WITH GFR
Anion gap: 15 (ref 5–15)
BUN: 43 mg/dL — ABNORMAL HIGH (ref 8–23)
CO2: 26 mmol/L (ref 22–32)
Calcium: 8.9 mg/dL (ref 8.9–10.3)
Chloride: 95 mmol/L — ABNORMAL LOW (ref 98–111)
Creatinine, Ser: 3.22 mg/dL — ABNORMAL HIGH (ref 0.44–1.00)
GFR, Estimated: 14 mL/min — ABNORMAL LOW
Glucose, Bld: 141 mg/dL — ABNORMAL HIGH (ref 70–99)
Potassium: 4.2 mmol/L (ref 3.5–5.1)
Sodium: 136 mmol/L (ref 135–145)

## 2024-05-31 LAB — CBC
HCT: 34.1 % — ABNORMAL LOW (ref 36.0–46.0)
Hemoglobin: 10.3 g/dL — ABNORMAL LOW (ref 12.0–15.0)
MCH: 29.8 pg (ref 26.0–34.0)
MCHC: 30.2 g/dL (ref 30.0–36.0)
MCV: 98.6 fL (ref 80.0–100.0)
Platelets: 437 10*3/uL — ABNORMAL HIGH (ref 150–400)
RBC: 3.46 MIL/uL — ABNORMAL LOW (ref 3.87–5.11)
RDW: 16.1 % — ABNORMAL HIGH (ref 11.5–15.5)
WBC: 9.8 10*3/uL (ref 4.0–10.5)
nRBC: 0 % (ref 0.0–0.2)

## 2024-05-31 LAB — GLUCOSE, CAPILLARY
Glucose-Capillary: 108 mg/dL — ABNORMAL HIGH (ref 70–99)
Glucose-Capillary: 107 mg/dL — ABNORMAL HIGH (ref 70–99)

## 2024-05-31 LAB — MAGNESIUM: Magnesium: 1.9 mg/dL (ref 1.7–2.4)

## 2024-05-31 LAB — TROPONIN T, HIGH SENSITIVITY: Troponin T High Sensitivity: 133 ng/L (ref 0–19)

## 2024-05-31 LAB — HEPATITIS B SURFACE ANTIBODY, QUANTITATIVE: Hep B S AB Quant (Post): 2283 m[IU]/mL

## 2024-05-31 MED ORDER — AMOXICILLIN-POT CLAVULANATE 875-125 MG PO TABS
1.0000 | ORAL_TABLET | Freq: Two times a day (BID) | ORAL | 0 refills | Status: AC
  Filled 2024-05-31: qty 8, 4d supply, fill #0

## 2024-05-31 MED ORDER — AZITHROMYCIN 500 MG PO TABS
500.0000 mg | ORAL_TABLET | Freq: Every day | ORAL | 0 refills | Status: AC
  Filled 2024-05-31: qty 1, 1d supply, fill #0

## 2024-05-31 MED ORDER — HEPARIN SODIUM (PORCINE) 1000 UNIT/ML IJ SOLN
INTRAMUSCULAR | Status: AC
  Filled 2024-05-31: qty 4

## 2024-05-31 NOTE — Progress Notes (Signed)
 " PROGRESS NOTE    Kayla [name]  FMW:[id number] DOB: 1941/01/09 DOA: 05/29/2024 PCP: Stephanie Charlene CROME, MD    Brief Narrative:   84 year old with history of HTN, HLD, CAD, paroxysmal A-fib, sick sinus syndrome status post pacemaker, ESRD on HD MWF, chronic hypoxic respiratory failure on 3 L nasal cannula admitted for increasing shortness of breath.  Admitted to the hospital around Christmas found to have large right-sided pleural effusion and COPD exacerbation treated with Unasyn  for bilateral pneumonia underwent thoracentesis noted to have transudative fluid.  Eventually discharged on Christmas Eve.  Now presents back again.  Chest x-ray showing concerns of possible pneumonia versus atelectasis therefore started on antibiotics.  Assessment & Plan:   Acute on chronic respiratory failure with hypoxia Recurrent right-sided pleural effusion, small Healthcare associated pneumonia   At home uses 3 L nasal cannula, currently requiring 4 L nasal cannula.  Chest x-ray showing some pleural effusion with signs of mild volume overload.  Procalcitonin 0.23.  Continue bronchodilators, I-S/flutter valve.  Continue empiric antibiotics.  We will transition to p.o. upon discharge -Ultrasound thoracentesis 1/27-1150 cc of dark amber fluid   Leukocytosis Continue to monitor on antibiotics   Diastolic congestive heart failure with preserved EF Echocardiogram in September 2025 showed EF of 55%, grade 2 DD, moderately elevated PASP, moderate to severe TR. nephrology managing fluid   ESRD on HD Nephrology follwing.    COPD -Continue home bronchodilators   Paroxysmal atrial fibrillation Patient in atrial fibrillation but currently rate controlled. - Continue Eliquis    Diabetes mellitus type 2, with long-term use of insulin  A1c 6 point.  Sliding scale and Accu-Cheks   Anemia of chronic disease Hemoglobin noted to be 9.8 which appears around patient's baseline. - Continue to monitor    Hypothyroidism Continue Synthroid  repeat outpatient TSH with PCP   DVT prophylaxis: apixaban  (ELIQUIS ) tablet 2.5 mg Start: 05/29/24 2200 apixaban  (ELIQUIS ) tablet 2.5 mg      Code Status: Do not attempt resuscitation (DNR) PRE-ARREST INTERVENTIONS DESIRED Family Communication:   Status is: Inpatient Remains inpatient appropriate because: Discharge today if she feels well after her hemodialysis later  PT Follow up Recs:   Subjective:  Seen in hemodialysis.  Does not have any complaints Tells me earlier she had some chest pain but she had declined further evaluation including chest x-ray and EKG.  She tells me she would like to further hold off on any workup at this time and would prefer to go home if doing well  Examination:  General exam: Appears calm and comfortable  Respiratory system: Clear to auscultation. Respiratory effort normal. Cardiovascular system: S1 & S2 heard, RRR. No JVD, murmurs, rubs, gallops or clicks. No pedal edema. Gastrointestinal system: Abdomen is nondistended, soft and nontender. No organomegaly or masses felt. Normal bowel sounds heard. Central nervous system: Alert and oriented. No focal neurological deficits. Extremities: Symmetric 5 x 5 power. Skin: No rashes, lesions or ulcers Psychiatry: Judgement and insight appear normal. Mood & affect appropriate.                Diet Orders (From admission, onward)     Start     Ordered   05/29/24 1624  Diet renal/carb modified with fluid restriction Diet-HS Snack? Nothing; Fluid restriction: 1200 mL Fluid; Room service appropriate? Yes; Fluid consistency: Thin  Diet effective now       Question Answer Comment  Diet-HS Snack? Nothing   Fluid restriction: 1200 mL Fluid   Room service appropriate? Yes   Fluid  consistency: Thin      05/29/24 1623            Objective: Vitals:   05/31/24 0900 05/31/24 0930 05/31/24 1000 05/31/24 1030  BP: 125/67 122/76 (!) 143/100 (!) 105/57  Pulse: (!)  102 (!) 108 99 (!) 111  Resp: (!) 23 (!) 22 (!) 38 (!) 22  Temp:      TempSrc:      SpO2: 100% 98% 100% 93%  Weight:      Height:        Intake/Output Summary (Last 24 hours) at 05/31/2024 1121 Last data filed at 05/31/2024 0505 Gross per 24 hour  Intake 195.77 ml  Output 250 ml  Net -54.23 ml   Filed Weights   05/29/24 1315 05/31/24 0800  Weight: 74.8 kg 74.3 kg    Scheduled Meds:  apixaban   2.5 mg Oral BID   azithromycin   500 mg Oral Daily   budesonide -glycopyrrolate -formoterol   2 puff Inhalation BID   Chlorhexidine  Gluconate Cloth  6 each Topical Q0600   doxercalciferol   7 mcg Oral Q M,W,F   insulin  aspart  0-5 Units Subcutaneous QHS   insulin  aspart  0-9 Units Subcutaneous TID WC   [START ON 06/02/2024] levothyroxine   100 mcg Oral Once per day on Monday Friday   And   levothyroxine   200 mcg Oral Once per day on Sunday Tuesday Wednesday Thursday Saturday   sodium chloride  flush  3 mL Intravenous Q12H   torsemide   100 mg Oral BID   Continuous Infusions:  cefTRIAXone  (ROCEPHIN )  IV 1 g (05/30/24 1555)    Nutritional status     Body mass index is 27.26 kg/m.  Data Reviewed:   CBC: Recent Labs  Lab 05/29/24 1408 05/29/24 1410 05/30/24 0712 05/31/24 0206  WBC 10.8*  --  12.3* 9.8  NEUTROABS 8.9*  --   --   --   HGB 9.8* 11.2* 9.9* 10.3*  HCT 34.0* 33.0* 33.4* 34.1*  MCV 103.3*  --  100.6* 98.6  PLT 385  --  372 437*   Basic Metabolic Panel: Recent Labs  Lab 05/29/24 1408 05/29/24 1410 05/30/24 0712 05/31/24 0206  NA 142 142 137 136  K 5.3* 5.1 4.2 4.2  CL 103 105 97* 95*  CO2 25  --  28 26  GLUCOSE 208* 200* 83 141*  BUN 60* 62* 28* 43*  CREATININE 3.91* 4.20* 2.40* 3.22*  CALCIUM  9.3  --  9.0 8.9  MG  --   --   --  1.9   GFR: Estimated Creatinine Clearance: 13.4 mL/min (A) (by C-G formula based on SCr of 3.22 mg/dL (H)). Liver Function Tests: Recent Labs  Lab 05/29/24 1408  AST 12*  ALT 9  ALKPHOS 81  BILITOT 0.3  PROT 7.0  ALBUMIN   3.3*   No results for input(s): LIPASE, AMYLASE in the last 168 hours. No results for input(s): AMMONIA in the last 168 hours. Coagulation Profile: No results for input(s): INR, PROTIME in the last 168 hours. Cardiac Enzymes: No results for input(s): CKTOTAL, CKMB, CKMBINDEX, TROPONINI in the last 168 hours. BNP (last 3 results) Recent Labs    04/27/24 2157 05/29/24 1408  PROBNP 9,246.0* 9,768.0*   HbA1C: No results for input(s): HGBA1C in the last 72 hours. CBG: Recent Labs  Lab 05/30/24 1159 05/30/24 1614 05/30/24 1923 05/30/24 2122 05/31/24 0652  GLUCAP 134* 93 176* 185* 108*   Lipid Profile: No results for input(s): CHOL, HDL, LDLCALC, TRIG, CHOLHDL, LDLDIRECT in the last  72 hours. Thyroid  Function Tests: No results for input(s): TSH, T4TOTAL, FREET4, T3FREE, THYROIDAB in the last 72 hours. Anemia Panel: No results for input(s): VITAMINB12, FOLATE, FERRITIN, TIBC, IRON, RETICCTPCT in the last 72 hours. Sepsis Labs: Recent Labs  Lab 05/29/24 1408  PROCALCITON 0.23    Recent Results (from the past 240 hours)  Resp panel by RT-PCR (RSV, Flu A&B, Covid) Anterior Nasal Swab     Status: None   Collection Time: 05/29/24  1:39 PM   Specimen: Anterior Nasal Swab  Result Value Ref Range Status   SARS Coronavirus 2 by RT PCR NEGATIVE NEGATIVE Final   Influenza A by PCR NEGATIVE NEGATIVE Final   Influenza B by PCR NEGATIVE NEGATIVE Final    Comment: (NOTE) The Xpert Xpress SARS-CoV-2/FLU/RSV plus assay is intended as an aid in the diagnosis of influenza from Nasopharyngeal swab specimens and should not be used as a sole basis for treatment. Nasal washings and aspirates are unacceptable for Xpert Xpress SARS-CoV-2/FLU/RSV testing.  Fact Sheet for Patients: bloggercourse.com  Fact Sheet for Healthcare Providers: seriousbroker.it  This test is not yet approved or  cleared by the [place]  FDA and has been authorized for detection and/or diagnosis of SARS-CoV-2 by FDA under an Emergency Use Authorization (EUA). This EUA will remain in effect (meaning this test can be used) for the duration of the COVID-19 declaration under Section 564(b)(1) of the Act, 21 U.S.C. section 360bbb-3(b)(1), unless the authorization is terminated or revoked.     Resp Syncytial Virus by PCR NEGATIVE NEGATIVE Final    Comment: (NOTE) Fact Sheet for Patients: bloggercourse.com  Fact Sheet for Healthcare Providers: seriousbroker.it  This test is not yet approved or cleared by the [place]  FDA and has been authorized for detection and/or diagnosis of SARS-CoV-2 by FDA under an Emergency Use Authorization (EUA). This EUA will remain in effect (meaning this test can be used) for the duration of the COVID-19 declaration under Section 564(b)(1) of the Act, 21 U.S.C. section 360bbb-3(b)(1), unless the authorization is terminated or revoked.  Performed at [facility], [address], [place], [place] [address]          Radiology Studies: DG Chest 1 View Result Date: 05/30/2024 EXAM: 1 VIEW XRAY OF THE CHEST 05/30/2024 10:56:00 AM COMPARISON: 05/29/2024 CLINICAL HISTORY: Status post thoracentesis. FINDINGS: LINES, TUBES AND DEVICES: Right chest central venous catheter in place with tip at superior cavoatrial junction. LUNGS AND PLEURA: Decreased right pleural effusion with small residual. Persistent right basilar opacities. No pneumothorax. HEART AND MEDIASTINUM: Stable cardiomegaly. Atherosclerotic calcifications noted. BONES AND SOFT TISSUES: Old left rib fractures. Cervical spinal surgical hardware noted. Surgical clips noted. IMPRESSION: 1. Decreased right pleural effusion with small residual. No pneumothorax identified following thoracentesis. 2. Persistent right basilar opacities. Electronically signed by:  Waddell Calk MD 05/30/2024 11:28 AM EST RP Workstation: [id number]   IR THORACENTESIS RIGHT ASP PLEURAL SPACE W/IMG GUIDE Result Date: 05/30/2024 INDICATION: 84 year old female. Endorsing shortness of breath. Found to have a right-sided pleural effusion. Request for a therapeutic right-sided thoracentesis. EXAM: ULTRASOUND GUIDED THERAPEUTIC RIGHT-SIDED THORACENTESIS MEDICATIONS: Lidocaine  1% 10 mL COMPLICATIONS: None immediate. PROCEDURE: An ultrasound guided thoracentesis was thoroughly discussed with the patient and questions answered. The benefits, risks, alternatives and complications were also discussed. The patient understands and wishes to proceed with the procedure. Written consent was obtained. Ultrasound was performed to localize and mark an adequate pocket of fluid in the right chest. The area was then prepped and draped in the normal  sterile fashion. 1% Lidocaine  was used for local anesthesia. Under ultrasound guidance a 6 Fr Safe-T-Centesis catheter was introduced. Thoracentesis was performed. The catheter was removed and a dressing applied. FINDINGS: A total of approximately 950 mL of dark amber fluid was removed. IMPRESSION: Successful ultrasound guided therapeutic right-sided thoracentesis yielding 950 mL of dark amber pleural fluid. Performed by Delon Beagle NP Electronically Signed   By: Juliene Balder M.D.   On: 05/30/2024 10:53   DG Chest Port 1 View Result Date: 05/29/2024 CLINICAL DATA:  sob EXAM: PORTABLE CHEST 1 VIEW COMPARISON:  Chest XR, 04/28/2024.  CT chest, 04/29/2024. FINDINGS: Support lines; RIGHT chest dialysis catheter, well-positioned with catheter tip at the superior cavoatrial junction. Aortic arch atherosclerosis. The RIGHT heart border is obscured. Cardiac silhouette is otherwise within normal limits. The LEFT lung is well inflated. Suspect at least small volume RIGHT pleural effusion, with similar appearance of RIGHT basilar consolidation silhouetting the  hemidiaphragm. No pneumothorax. Thyroidectomy clips. Cervical fusion hardware. No acute displaced fracture. IMPRESSION: 1. Small volume pleural effusion with RIGHT basilar consolidation. Findings likely to represent atelectasis, however superimposed pneumonia can appear similar. 2.  Aortic Atherosclerosis (ICD10-I70.0). Electronically Signed   By: Thom Hall M.D.   On: 05/29/2024 14:03           LOS: 2 days   Time spent= 35 mins    Burgess JAYSON Dare, MD [facility]  If 7PM-7AM, please contact night-coverage  05/31/2024, 11:21 AM  "

## 2024-05-31 NOTE — Progress Notes (Signed)
" °   05/31/24 1417  Vital Signs  Pulse Rate (!) 102 (between 87 and 102 bpm during ambulation)  Pulse Rate Source Other (Comment) (pulse oximeter on digit 3 or Right hand)  Patient Position (if appropriate) Standing (ambulating)  Oxygen  Therapy  SpO2 93 % (93-99% on 3L during ambulation)  O2 Device Nasal Cannula  O2 Flow Rate (L/min) 3 L/min  Patient Activity (if Appropriate) Ambulating (100 ft + 100 ft (one seated rest break))  Pulse Oximetry Type Continuous  Oximetry Probe Site Changed No   SATURATION QUALIFICATIONS: (This note is used to comply with regulatory documentation for home oxygen )  Patient Saturations on 3 Liters of oxygen  (patient's baseline with pt declining test on Room Air) at Rest = 99%  Patient Saturations on 3 Liters of oxygen  while Ambulating (patient's baseline with pt declining test on Room Air)  while Ambulating = 93%  Please briefly explain why patient needs home oxygen : Pt demonstrating ability to ambulate 100 ft + 100 ft with a RW with pt requiring one seated rest break. Pt with baseline use of 3L O2 through nasal cannula in the home prior to this hospitalization.  "
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
## 2024-05-31 NOTE — Progress Notes (Signed)
 Patient discharge, OT completed walk test. Oxygen  oln 3l (baseline) no lower than 93.

## 2024-05-31 NOTE — Discharge Summary (Signed)
 Physician Discharge Summary  ORVETTA DANIELSKI FMW:[id number] DOB: June 09, 1940 DOA: 05/29/2024  PCP: Stephanie Charlene CROME, MD  Admit date: 05/29/2024 Discharge date: 05/31/2024  Admitted From: Home Disposition: Home with home health  Recommendations for Outpatient Follow-up:  Follow up with PCP in 1-2 weeks Please obtain BMP/CBC in one week your next doctors visit.  Augmentin  for 4 more days.  1 more days of p.o. azithromycin  Resume outpatient hemodialysis Patient really wants to go home today   Discharge Condition: Stable CODE STATUS: DNR Diet recommendation: Heart healthy  Brief/Interim Summary: Brief Narrative:   83 year old with history of HTN, HLD, CAD, paroxysmal A-fib, sick sinus syndrome status post pacemaker, ESRD on HD MWF, chronic hypoxic respiratory failure on 3 L nasal cannula admitted for increasing shortness of breath.  Admitted to the hospital around Christmas found to have large right-sided pleural effusion and COPD exacerbation treated with Unasyn  for bilateral pneumonia underwent thoracentesis noted to have transudative fluid.  Eventually discharged on Christmas Eve.  Now presents back again.  Chest x-ray showing concerns of possible pneumonia versus atelectasis therefore started on antibiotics. Changed to PO Abx. Volume was managed with HD.  Stable for dc today.   Assessment & Plan:   Acute on chronic respiratory failure with hypoxia Recurrent right-sided pleural effusion, small Healthcare associated pneumonia   At home uses 3 L nasal cannula, currently requiring 4 L nasal cannula.  Chest x-ray showing some pleural effusion with signs of mild volume overload.  Procalcitonin 0.23.  Continue bronchodilators, I-S/flutter valve.  Continue empiric antibiotics.  We will transition to p.o. upon discharge -Ultrasound thoracentesis 1/27-1150 cc of dark amber fluid   Leukocytosis Continue to monitor on antibiotics   Diastolic congestive heart failure with preserved  EF Echocardiogram in September 2025 showed EF of 55%, grade 2 DD, moderately elevated PASP, moderate to severe TR. nephrology managing fluid w/ HD   ESRD on HD Nephrology follwing.    COPD -Continue home bronchodilators   Paroxysmal atrial fibrillation Patient in atrial fibrillation but currently rate controlled. - Continue Eliquis    Diabetes mellitus type 2, with long-term use of insulin  A1c 6 point.  Sliding scale and Accu-Cheks   Anemia of chronic disease Hemoglobin noted to be 9.8 which appears around patient's baseline. - Continue to monitor   Hypothyroidism Continue Synthroid  repeat outpatient TSH with PCP   DVT prophylaxis: Eliquis     Code Status: Do not attempt resuscitation (DNR) PRE-ARREST INTERVENTIONS DESIRED Family Communication:   Status is: Inpatient Remains inpatient appropriate because: Discharge today if she feels well after her hemodialysis later  PT Follow up Recs:   Subjective:  Doing well  Wants to go home.   Examination:  General exam: Appears calm and comfortable  Respiratory system: Clear to auscultation. Respiratory effort normal. Cardiovascular system: S1 & S2 heard, RRR. No JVD, murmurs, rubs, gallops or clicks. No pedal edema. Gastrointestinal system: Abdomen is nondistended, soft and nontender. No organomegaly or masses felt. Normal bowel sounds heard. Central nervous system: Alert and oriented. No focal neurological deficits. Extremities: Symmetric 5 x 5 power. Skin: No rashes, lesions or ulcers Psychiatry: Judgement and insight appear normal. Mood & affect appropriate.    Discharge Diagnoses:  Principal Problem:   Acute on chronic respiratory failure with hypoxia (HCC) Active Problems:   Recurrent pleural effusion on right   HCAP (healthcare-associated pneumonia)   Leukocytosis   Acute on chronic diastolic CHF (congestive heart failure) (HCC)   ESRD on dialysis MWF schedule (HCC)   COPD (chronic obstructive pulmonary  disease)  (HCC)   Insulin  dependent type 2 diabetes mellitus (HCC)   Anemia of chronic disease due to ESRD   HYPOTHYROIDISM, POSTSURGICAL      Discharge Exam: Vitals:   05/31/24 1216 05/31/24 1417  BP: 129/62   Pulse: (!) 120 (!) 102  Resp: 18   Temp: 97.6 F (36.4 C)   SpO2: 97% 93%   Vitals:   05/31/24 1130 05/31/24 1138 05/31/24 1216 05/31/24 1417  BP: 119/66 118/81 129/62   Pulse: 97 (!) 115 (!) 120 (!) 102  Resp: 16 (!) 25 18   Temp:  97.6 F (36.4 C) 97.6 F (36.4 C)   TempSrc:  Oral    SpO2: 99% 98% 97% 93%  Weight:  71.8 kg    Height:          Discharge Instructions   Allergies as of 05/31/2024       Reactions   Tape Other (See Comments)   SKIN IS VERY THIN- TEARS EASILY!!   Oxycodone  Itching, Other (See Comments)   Pt still takes this med even thought it causes itching   Trelegy Ellipta  [fluticasone -umeclidin-vilant] Nausea And Vomiting, Other (See Comments), Cough   Made the patient sick to her stomach (only) several days after using it    Trelegy Ellipta  [fluticasone -umeclidin-vilant] Nausea And Vomiting   Trulicity [dulaglutide] Nausea And Vomiting, Nausea Only, Other (See Comments), Cough   TRULICITY made the patient sick to her stomach (only) several days after using it   Trulicity [dulaglutide] Nausea And Vomiting        Medication List     TAKE these medications    albuterol  108 (90 Base) MCG/ACT inhaler Commonly known as: VENTOLIN  HFA Inhale 2 puffs into the lungs every 6 (six) hours as needed for wheezing or shortness of breath.   albuterol  (2.5 MG/3ML) 0.083% nebulizer solution Commonly known as: PROVENTIL  Take 3 mLs (2.5 mg total) by nebulization every 2 (two) hours as needed for wheezing or shortness of breath.   amLODipine  5 MG tablet Commonly known as: NORVASC  Take 5 mg by mouth in the morning.   amoxicillin -clavulanate 875-125 MG tablet Commonly known as: AUGMENTIN  Take 1 tablet by mouth 2 (two) times daily for 4 days.    apixaban  2.5 MG Tabs tablet Commonly known as: ELIQUIS  Take 2.5 mg by mouth 2 (two) times daily.   ascorbic acid  1000 MG tablet Commonly known as: VITAMIN C  Take 1,000 mg by mouth daily with lunch.   atorvastatin  40 MG tablet Commonly known as: LIPITOR TAKE 1 TABLET BY MOUTH DAILY   azithromycin  500 MG tablet Commonly known as: ZITHROMAX  Take 1 tablet (500 mg total) by mouth daily for 1 day.   benzonatate  100 MG capsule Commonly known as: TESSALON  Take 1 capsule (100 mg total) by mouth 3 (three) times daily. What changed:  when to take this reasons to take this   Breztri  Aerosphere 160-9-4.8 MCG/ACT Aero inhaler Generic drug: budesonide -glycopyrrolate -formoterol  Inhale 2 puffs into the lungs in the morning and at bedtime.   busPIRone  5 MG tablet Commonly known as: BUSPAR  Take 1 tablet (5 mg total) by mouth 2 (two) times daily.   cyanocobalamin  1000 MCG tablet Commonly known as: VITAMIN B12 Take 1,000 mcg by mouth daily.   dextromethorphan  30 MG/5ML liquid Commonly known as: DELSYM  Take 2.5 mLs (15 mg total) by mouth 2 (two) times daily.   docusate sodium  100 MG capsule Commonly known as: COLACE Take 1 capsule (100 mg total) by mouth 2 (two) times daily  as needed for mild constipation or moderate constipation.   escitalopram  10 MG tablet Commonly known as: LEXAPRO  Take 10 mg by mouth daily.   fluticasone  50 MCG/ACT nasal spray Commonly known as: FLONASE  Place 1 spray into both nostrils 2 (two) times daily as needed for allergies or rhinitis.   guaiFENesin  600 MG 12 hr tablet Commonly known as: MUCINEX  Take 1 tablet (600 mg total) by mouth 2 (two) times daily.   HumaLOG  KwikPen 100 UNIT/ML KwikPen Generic drug: insulin  lispro Inject 0-9 Units into the skin: Sliding scale insulin  Less than 70 initiate hypoglycemia protocol;  70-120  0 units, 120-150 1 unit; 151-200 2 units; 201-250 3 units; 251-300 5 units;  301-350 7 units; 351-400 9 units. Greater than 400  call MD   HYDROcodone -acetaminophen  7.5-325 MG tablet Commonly known as: NORCO Take 1 tablet by mouth every 6 (six) hours as needed for moderate pain (pain score 4-6).   isosorbide  mononitrate 60 MG 24 hr tablet Commonly known as: IMDUR  Take 1 tablet (60 mg total) by mouth daily.   Lantus  SoloStar 100 UNIT/ML Solostar Pen Generic drug: insulin  glargine Inject 15 Units into the skin daily before breakfast.   levothyroxine  200 MCG tablet Commonly known as: SYNTHROID  Take 100-200 mcg by mouth See admin instructions. Take 100 mg (split 200 mg in half) tablet by mouth every Monday, Wednesday and Friday, then take 200 mg  tablet all other days per patient.   nitroGLYCERIN  0.4 MG SL tablet Commonly known as: NITROSTAT  Place 1 tablet (0.4 mg total) under the tongue every 5 (five) minutes as needed for chest pain.   omeprazole  20 MG tablet Commonly known as: PRILOSEC OTC Take 20 mg by mouth daily at 2 PM.   OXYGEN  Inhale 3 L into the lungs continuous.   Polyethyl Glycol-Propyl Glycol 0.4-0.3 % Soln Place 1 drop into both eyes as needed (dry eyes, eye irritations.).   sevelamer  carbonate 800 MG tablet Commonly known as: RENVELA  Take 800 mg by mouth 3 (three) times daily with meals.   torsemide  100 MG tablet Commonly known as: DEMADEX  Take 1 tablet (100 mg total) by mouth 2 (two) times daily. What changed:  when to take this additional instructions   Vitamin D3 25 MCG (1000 UT) Caps Take 1,000 Units by mouth daily.        Allergies[1]  You were cared for by a hospitalist during your hospital stay. If you have any questions about your discharge medications or the care you received while you were in the hospital after you are discharged, you can call the unit and asked to speak with the hospitalist on call if the hospitalist that took care of you is not available. Once you are discharged, your primary care physician will handle any further medical issues. Please note that no  refills for any discharge medications will be authorized once you are discharged, as it is imperative that you return to your primary care physician (or establish a relationship with a primary care physician if you do not have one) for your aftercare needs so that they can reassess your need for medications and monitor your lab values.  You were cared for by a hospitalist during your hospital stay. If you have any questions about your discharge medications or the care you received while you were in the hospital after you are discharged, you can call the unit and asked to speak with the hospitalist on call if the hospitalist that took care of you is not available. Once  you are discharged, your primary care physician will handle any further medical issues. Please note that NO REFILLS for any discharge medications will be authorized once you are discharged, as it is imperative that you return to your primary care physician (or establish a relationship with a primary care physician if you do not have one) for your aftercare needs so that they can reassess your need for medications and monitor your lab values.  Please request your Prim.MD to go over all Hospital Tests and Procedure/Radiological results at the follow up, please get all Hospital records sent to your Prim MD by signing hospital release before you go home.  Get CBC, CMP, 2 view Chest X ray checked  by Primary MD during your next visit or SNF MD in 5-7 days ( we routinely change or add medications that can affect your baseline labs and fluid status, therefore we recommend that you get the mentioned basic workup next visit with your PCP, your PCP may decide not to get them or add new tests based on their clinical decision)  On your next visit with your primary care physician please Get Medicines reviewed and adjusted.  If you experience worsening of your admission symptoms, develop shortness of breath, life threatening emergency, suicidal or  homicidal thoughts you must seek medical attention immediately by calling 911 or calling your MD immediately  if symptoms less severe.  You Must read complete instructions/literature along with all the possible adverse reactions/side effects for all the Medicines you take and that have been prescribed to you. Take any new Medicines after you have completely understood and accpet all the possible adverse reactions/side effects.   Do not drive, operate heavy machinery, perform activities at heights, swimming or participation in water activities or provide baby sitting services if your were admitted for syncope or siezures until you have seen by Primary MD or a Neurologist and advised to do so again.  Do not drive when taking Pain medications.   Procedures/Studies: DG Chest 1 View Result Date: 05/30/2024 EXAM: 1 VIEW XRAY OF THE CHEST 05/30/2024 10:56:00 AM COMPARISON: 05/29/2024 CLINICAL HISTORY: Status post thoracentesis. FINDINGS: LINES, TUBES AND DEVICES: Right chest central venous catheter in place with tip at superior cavoatrial junction. LUNGS AND PLEURA: Decreased right pleural effusion with small residual. Persistent right basilar opacities. No pneumothorax. HEART AND MEDIASTINUM: Stable cardiomegaly. Atherosclerotic calcifications noted. BONES AND SOFT TISSUES: Old left rib fractures. Cervical spinal surgical hardware noted. Surgical clips noted. IMPRESSION: 1. Decreased right pleural effusion with small residual. No pneumothorax identified following thoracentesis. 2. Persistent right basilar opacities. Electronically signed by: Waddell Calk MD 05/30/2024 11:28 AM EST RP Workstation: [id number]   IR THORACENTESIS RIGHT ASP PLEURAL SPACE W/IMG GUIDE Result Date: 05/30/2024 INDICATION: 84 year old female. Endorsing shortness of breath. Found to have a right-sided pleural effusion. Request for a therapeutic right-sided thoracentesis. EXAM: ULTRASOUND GUIDED THERAPEUTIC RIGHT-SIDED THORACENTESIS  MEDICATIONS: Lidocaine  1% 10 mL COMPLICATIONS: None immediate. PROCEDURE: An ultrasound guided thoracentesis was thoroughly discussed with the patient and questions answered. The benefits, risks, alternatives and complications were also discussed. The patient understands and wishes to proceed with the procedure. Written consent was obtained. Ultrasound was performed to localize and mark an adequate pocket of fluid in the right chest. The area was then prepped and draped in the normal sterile fashion. 1% Lidocaine  was used for local anesthesia. Under ultrasound guidance a 6 Fr Safe-T-Centesis catheter was introduced. Thoracentesis was performed. The catheter was removed and a dressing applied. FINDINGS: A total of approximately  950 mL of dark amber fluid was removed. IMPRESSION: Successful ultrasound guided therapeutic right-sided thoracentesis yielding 950 mL of dark amber pleural fluid. Performed by Delon Beagle NP Electronically Signed   By: Juliene Balder M.D.   On: 05/30/2024 10:53   DG Chest Port 1 View Result Date: 05/29/2024 CLINICAL DATA:  sob EXAM: PORTABLE CHEST 1 VIEW COMPARISON:  Chest XR, 04/28/2024.  CT chest, 04/29/2024. FINDINGS: Support lines; RIGHT chest dialysis catheter, well-positioned with catheter tip at the superior cavoatrial junction. Aortic arch atherosclerosis. The RIGHT heart border is obscured. Cardiac silhouette is otherwise within normal limits. The LEFT lung is well inflated. Suspect at least small volume RIGHT pleural effusion, with similar appearance of RIGHT basilar consolidation silhouetting the hemidiaphragm. No pneumothorax. Thyroidectomy clips. Cervical fusion hardware. No acute displaced fracture. IMPRESSION: 1. Small volume pleural effusion with RIGHT basilar consolidation. Findings likely to represent atelectasis, however superimposed pneumonia can appear similar. 2.  Aortic Atherosclerosis (ICD10-I70.0). Electronically Signed   By: Thom Hall M.D.   On: 05/29/2024  14:03     The results of significant diagnostics from this hospitalization (including imaging, microbiology, ancillary and laboratory) are listed below for reference.     Microbiology: Recent Results (from the past 240 hours)  Resp panel by RT-PCR (RSV, Flu A&B, Covid) Anterior Nasal Swab     Status: None   Collection Time: 05/29/24  1:39 PM   Specimen: Anterior Nasal Swab  Result Value Ref Range Status   SARS Coronavirus 2 by RT PCR NEGATIVE NEGATIVE Final   Influenza A by PCR NEGATIVE NEGATIVE Final   Influenza B by PCR NEGATIVE NEGATIVE Final    Comment: (NOTE) The Xpert Xpress SARS-CoV-2/FLU/RSV plus assay is intended as an aid in the diagnosis of influenza from Nasopharyngeal swab specimens and should not be used as a sole basis for treatment. Nasal washings and aspirates are unacceptable for Xpert Xpress SARS-CoV-2/FLU/RSV testing.  Fact Sheet for Patients: bloggercourse.com  Fact Sheet for Healthcare Providers: seriousbroker.it  This test is not yet approved or cleared by the [place]  FDA and has been authorized for detection and/or diagnosis of SARS-CoV-2 by FDA under an Emergency Use Authorization (EUA). This EUA will remain in effect (meaning this test can be used) for the duration of the COVID-19 declaration under Section 564(b)(1) of the Act, 21 U.S.C. section 360bbb-3(b)(1), unless the authorization is terminated or revoked.     Resp Syncytial Virus by PCR NEGATIVE NEGATIVE Final    Comment: (NOTE) Fact Sheet for Patients: bloggercourse.com  Fact Sheet for Healthcare Providers: seriousbroker.it  This test is not yet approved or cleared by the [place]  FDA and has been authorized for detection and/or diagnosis of SARS-CoV-2 by FDA under an Emergency Use Authorization (EUA). This EUA will remain in effect (meaning this test can be used) for the  duration of the COVID-19 declaration under Section 564(b)(1) of the Act, 21 U.S.C. section 360bbb-3(b)(1), unless the authorization is terminated or revoked.  Performed at [facility], [address], [place], [place] [address]      Labs: BNP (last 3 results) Recent Labs    08/29/23 2000  BNP 468.9*   Basic Metabolic Panel: Recent Labs  Lab 05/29/24 1408 05/29/24 1410 05/30/24 0712 05/31/24 0206  NA 142 142 137 136  K 5.3* 5.1 4.2 4.2  CL 103 105 97* 95*  CO2 25  --  28 26  GLUCOSE 208* 200* 83 141*  BUN 60* 62* 28* 43*  CREATININE 3.91* 4.20* 2.40*  3.22*  CALCIUM  9.3  --  9.0 8.9  MG  --   --   --  1.9   Liver Function Tests: Recent Labs  Lab 05/29/24 1408  AST 12*  ALT 9  ALKPHOS 81  BILITOT 0.3  PROT 7.0  ALBUMIN  3.3*   No results for input(s): LIPASE, AMYLASE in the last 168 hours. No results for input(s): AMMONIA in the last 168 hours. CBC: Recent Labs  Lab 05/29/24 1408 05/29/24 1410 05/30/24 0712 05/31/24 0206  WBC 10.8*  --  12.3* 9.8  NEUTROABS 8.9*  --   --   --   HGB 9.8* 11.2* 9.9* 10.3*  HCT 34.0* 33.0* 33.4* 34.1*  MCV 103.3*  --  100.6* 98.6  PLT 385  --  372 437*   Cardiac Enzymes: No results for input(s): CKTOTAL, CKMB, CKMBINDEX, TROPONINI in the last 168 hours. BNP: Invalid input(s): POCBNP CBG: Recent Labs  Lab 05/30/24 1614 05/30/24 1923 05/30/24 2122 05/31/24 0652 05/31/24 1215  GLUCAP 93 176* 185* 108* 107*   D-Dimer No results for input(s): DDIMER in the last 72 hours. Hgb A1c No results for input(s): HGBA1C in the last 72 hours. Lipid Profile No results for input(s): CHOL, HDL, LDLCALC, TRIG, CHOLHDL, LDLDIRECT in the last 72 hours. Thyroid  function studies No results for input(s): TSH, T4TOTAL, T3FREE, THYROIDAB in the last 72 hours.  Invalid input(s): FREET3 Anemia work up No results for input(s): VITAMINB12, FOLATE, FERRITIN, TIBC, IRON,  RETICCTPCT in the last 72 hours. Urinalysis    Component Value Date/Time   COLORURINE YELLOW 03/19/2024 1255   APPEARANCEUR CLEAR 03/19/2024 1255   LABSPEC 1.011 03/19/2024 1255   PHURINE 5.0 03/19/2024 1255   GLUCOSEU NEGATIVE 03/19/2024 1255   GLUCOSEU NEGATIVE 12/09/2018 0944   HGBUR NEGATIVE 03/19/2024 1255   BILIRUBINUR NEGATIVE 03/19/2024 1255   KETONESUR NEGATIVE 03/19/2024 1255   PROTEINUR >=300 (A) 03/19/2024 1255   UROBILINOGEN 0.2 12/09/2018 0944   NITRITE NEGATIVE 03/19/2024 1255   LEUKOCYTESUR NEGATIVE 03/19/2024 1255   Sepsis Labs Recent Labs  Lab 05/29/24 1408 05/30/24 0712 05/31/24 0206  WBC 10.8* 12.3* 9.8   Microbiology Recent Results (from the past 240 hours)  Resp panel by RT-PCR (RSV, Flu A&B, Covid) Anterior Nasal Swab     Status: None   Collection Time: 05/29/24  1:39 PM   Specimen: Anterior Nasal Swab  Result Value Ref Range Status   SARS Coronavirus 2 by RT PCR NEGATIVE NEGATIVE Final   Influenza A by PCR NEGATIVE NEGATIVE Final   Influenza B by PCR NEGATIVE NEGATIVE Final    Comment: (NOTE) The Xpert Xpress SARS-CoV-2/FLU/RSV plus assay is intended as an aid in the diagnosis of influenza from Nasopharyngeal swab specimens and should not be used as a sole basis for treatment. Nasal washings and aspirates are unacceptable for Xpert Xpress SARS-CoV-2/FLU/RSV testing.  Fact Sheet for Patients: bloggercourse.com  Fact Sheet for Healthcare Providers: seriousbroker.it  This test is not yet approved or cleared by the [place]  FDA and has been authorized for detection and/or diagnosis of SARS-CoV-2 by FDA under an Emergency Use Authorization (EUA). This EUA will remain in effect (meaning this test can be used) for the duration of the COVID-19 declaration under Section 564(b)(1) of the Act, 21 U.S.C. section 360bbb-3(b)(1), unless the authorization is terminated or revoked.     Resp  Syncytial Virus by PCR NEGATIVE NEGATIVE Final    Comment: (NOTE) Fact Sheet for Patients: bloggercourse.com  Fact Sheet for Healthcare Providers: seriousbroker.it  This  test is not yet approved or cleared by the [place]  FDA and has been authorized for detection and/or diagnosis of SARS-CoV-2 by FDA under an Emergency Use Authorization (EUA). This EUA will remain in effect (meaning this test can be used) for the duration of the COVID-19 declaration under Section 564(b)(1) of the Act, 21 U.S.C. section 360bbb-3(b)(1), unless the authorization is terminated or revoked.  Performed at [facility], [address] [place], [place] [address]      Time coordinating discharge:  I have spent 35 minutes face to face with the patient and on the ward discussing the patients care, assessment, plan and disposition with other care givers. >50% of the time was devoted counseling the patient about the risks and benefits of treatment/Discharge disposition and coordinating care.   SIGNED:   Burgess JAYSON Dare, MD  [facility] 05/31/2024, 4:54 PM   If 7PM-7AM, please contact night-coverage      [1]  Allergies Allergen Reactions   Tape Other (See Comments)    SKIN IS VERY THIN- TEARS EASILY!!   Oxycodone  Itching and Other (See Comments)    Pt still takes this med even thought it causes itching   Trelegy Ellipta  [Fluticasone -Umeclidin-Vilant] Nausea And Vomiting, Other (See Comments) and Cough    Made the patient sick to her stomach (only) several days after using it    Trelegy Ellipta  [Fluticasone -Umeclidin-Vilant] Nausea And Vomiting   Trulicity [Dulaglutide] Nausea And Vomiting, Nausea Only, Other (See Comments) and Cough    TRULICITY made the patient sick to her stomach (only) several days after using it   Trulicity [Dulaglutide] Nausea And Vomiting

## 2024-05-31 NOTE — Progress Notes (Signed)
 OT Cancellation Note  Patient Details Name: Kayla [name] MRN: [id number] DOB: May 15, 1940   Cancelled Treatment:    Reason Eval/Treat Not Completed: Patient at procedure or test/ unavailable (Pt currently off unit for HD. OT to reattempt to see pt at a later time as appropriate/available.)  [name] HERO., OTR/L, MA Acute Rehab [phone]   [name] 05/31/2024, 8:56 AM

## 2024-05-31 NOTE — Evaluation (Signed)
 Occupational Therapy Evaluation Patient Details Name: Kayla [name] MRN: [id number] DOB: 1940-06-16 Today's Date: 05/31/2024   History of Present Illness   84 y.o. female presents to [facility] hospital on 05/29/2024 with SOB and cough. Chest x-ray with R basilar consolidation. Pt underwent thoracentesis on 1/27. PMH includes HTN, HLD, CAD, PAF, sinus sick syndrome s/p PPM, ESRD, chronic respiratory failure on 3L [place].     Clinical Impressions At baseline, pt completes ADLs Independent to Contact guard assist, functional mobility Mod I with a Rollator, and receives assistance from family for IADLs PRN. Pt uses 3L continuous O2 through nasal cannula at baseline. Pt now presents with decreased activity tolerance, decreased balance, decreased B UE strength, increased feeling of SOB with O2 remaining >/93% on 3L, decreased cognition, and decreased safety and independence with functional tasks. Pt currently demonstrating ability to complete ADLs largely with Set up to Min assist, bed mobility with Supervision to Min assist, and functional transfers/mobility with a RW with Contact guard to Min assist. Pt requiring cues throughout session for safety. Pt O2 sat >/93% on 3L continuous O2 through nasal cannula throughout session, including while ambulating 100' +100' with a RW with one seated rest break. Pt with HR between 87 and 102 bpm during session. Pt reporting feeling of SOB following ambulation, but with O2 remaining >/93% on 3L. Pt participated well in session and has 24/7 family support available at home. Plan for pt to discharge plan later today with support of family and HH OT services to maximize rehab potential. If pt does not discharge as planned, she will benefit from acute skilled OT services to address deficits and increase safety and independence with functional tasks.       If plan is discharge home, recommend the following:   A little help with walking and/or transfers;A little help with  bathing/dressing/bathroom;Assistance with cooking/housework;Direct supervision/assist for medications management;Direct supervision/assist for financial management;Assist for transportation;Help with stairs or ramp for entrance;Supervision due to cognitive status     Functional Status Assessment   Patient has had a recent decline in their functional status and demonstrates the ability to make significant improvements in function in a reasonable and predictable amount of time.     Equipment Recommendations   None recommended by OT (Pt already has needed equipment)     Recommendations for Other Services         Precautions/Restrictions   Precautions Precautions: Fall;Other (comment) Recall of Precautions/Restrictions: Intact Precaution/Restrictions Comments: monitor SpO2 Restrictions Weight Bearing Restrictions Per Provider Order: No     Mobility Bed Mobility Overal bed mobility: Needs Assistance Bed Mobility: Supine to Sit, Sit to Supine     Supine to sit: Min assist Sit to supine: Supervision, HOB elevated (with increased time)   General bed mobility comments: cues for hand placement/technique    Transfers Overall transfer level: Needs assistance Equipment used: Rollator (4 wheels) Transfers: Sit to/from Stand, Bed to chair/wheelchair/BSC Sit to Stand: Contact guard assist     Step pivot transfers: Contact guard assist     General transfer comment: cues for hand placement/technique and safety      Balance Overall balance assessment: Needs assistance Sitting-balance support: No upper extremity supported, Feet supported Sitting balance-Leahy Scale: Good     Standing balance support: Single extremity supported, Bilateral upper extremity supported, Reliant on assistive device for balance, During functional activity Standing balance-Leahy Scale: Poor Standing balance comment: reliant on UE support  ADL either performed  or assessed with clinical judgement   ADL Overall ADL's : Needs assistance/impaired Eating/Feeding: Set up;Sitting   Grooming: Contact guard assist;Standing   Upper Body Bathing: Set up;Supervision/ safety;Sitting Upper Body Bathing Details (indicate cue type and reason): simulated Lower Body Bathing: Minimal assistance;Sit to/from stand;Sitting/lateral leans;Cueing for safety Lower Body Bathing Details (indicate cue type and reason): simulated Upper Body Dressing : Contact guard assist;Sitting   Lower Body Dressing: Minimal assistance;Sitting/lateral leans;Sit to/from stand;Cueing for safety   Toilet Transfer: Contact guard assist;Minimal assistance;BSC/3in1;Rolling walker (2 wheels);Cueing for safety;Ambulation (Min assist to boost up then CGA once in standing) Toilet Transfer Details (indicate cue type and reason): simulated bed <-> chair Toileting- Clothing Manipulation and Hygiene: Contact guard assist;Sit to/from stand;Cueing for safety Toileting - Clothing Manipulation Details (indicate cue type and reason): simulated     Functional mobility during ADLs: Contact guard assist;Rolling walker (2 wheels);Cueing for safety (cues for hand placement/technique) General ADL Comments: Pt with significantly decreased activity tolerance, fatiguing quickly and requiring frequent seated rest breaks.     Vision Baseline Vision/History: 1 Wears glasses Ability to See in Adequate Light: 0 Adequate Patient Visual Report: No change from baseline Additional Comments: Vision [facility] for tasks assessed; not formally screened or evaluated     Perception         Praxis         Pertinent Vitals/Pain Pain Assessment Pain Assessment: No/denies pain     Extremity/Trunk Assessment Upper Extremity Assessment Upper Extremity Assessment: Right hand dominant;Overall WFL for tasks assessed (gross B UE 4- to 4/5; B AROM, coordination, and sensation WFL)   Lower Extremity Assessment Lower Extremity  Assessment: Defer to PT evaluation   Cervical / Trunk Assessment Cervical / Trunk Assessment: Kyphotic   Communication Communication Communication: Impaired Factors Affecting Communication: Hearing impaired   Cognition Arousal: Alert Behavior During Therapy: WFL for tasks assessed/performed, Impulsive (occasionally impulsive with movement) Cognition: Cognition impaired     Awareness: Intellectual awareness intact, Online awareness intact (intermittent online awareness) Memory impairment (select all impairments): Short-term memory, Working memory Attention impairment (select first level of impairment): Selective attention (easily internally distracted) Executive functioning impairment (select all impairments): Organization, Problem solving OT - Cognition Comments: Pt AAOx4 with cognitive deficts noted above. Pt with poor safety awareness and often impulsive with movement.                 Following commands: Intact       Cueing  General Comments   Cueing Techniques: Verbal cues  Pt O2 sat >/93% on 3L continuous O2 through nasal cannula throughout session, including while ambulating 100' +100' with a RW with one seated rest break. Pt with HR between 87 and 102 bpm during session. Pt reporting feeling of SOB following ambulation, but with O2 remaining >/93% on 3L.   Exercises     Shoulder Instructions      Home Living Family/patient expects to be discharged to:: Private residence Living Arrangements: Spouse/significant other Available Help at Discharge: Family;Available 24 hours/day Type of Home: House Home Access: Ramped entrance     Home Layout: One level     Bathroom Shower/Tub: Tub/shower unit;Sponge bathes at baseline (sponges bathes due to dialysis port)   Bathroom Toilet: Standard Bathroom Accessibility: Yes   Home Equipment: Tub bench;Rollator (4 wheels);Wheelchair - Nurse, [facility] (2 wheels);Other (comment) (home oxygen  equipment)    Additional Comments: Pt on 3L continuous O2 through [place] at baseline      Prior Functioning/Environment Prior Level of Function :  Needs assist             Mobility Comments: ambulatory with a rollator for household distances ADLs Comments: assistance for lower body dressing; otherwise Independent to Mod I with ADLs; family assist with IADLs PRN    OT Problem List: Decreased strength;Decreased activity tolerance;Impaired balance (sitting and/or standing);Decreased cognition;Decreased safety awareness;Decreased knowledge of use of DME or AE;Decreased knowledge of precautions;Cardiopulmonary status limiting activity   OT Treatment/Interventions: Self-care/ADL training;Therapeutic exercise;Energy conservation;DME and/or AE instruction;Therapeutic activities;Cognitive remediation/compensation;Patient/family education;Balance training      OT Goals(Current goals can be found in the care plan section)   Acute Rehab OT Goals Patient Stated Goal: to return home OT Goal Formulation: With patient/family Time For Goal Achievement: 06/14/24 Potential to Achieve Goals: Good ADL Goals Pt Will Perform Grooming: with modified independence;standing Pt Will Perform Upper Body Bathing: with modified independence;sitting (with adaptive equipment as needed) Pt Will Perform Lower Body Dressing: with modified independence;sitting/lateral leans;sit to/from stand (with adaptive equipment as needed) Pt Will Transfer to Toilet: with modified independence;ambulating;regular height toilet (with least restrictive AD) Pt Will Perform Toileting - Clothing Manipulation and hygiene: with modified independence;sit to/from stand;sitting/lateral leans Additional ADL Goal #1: Patient will demonstrate ability to Independently state 3 energy conservation strategies to increase safety and independence with functional tasks.   OT Frequency:  Min 2X/week    Co-evaluation              AM-PAC OT 6 Clicks Daily  Activity     Outcome Measure Help from another person eating meals?: A Little Help from another person taking care of personal grooming?: A Little Help from another person toileting, which includes using toliet, bedpan, or urinal?: A Little Help from another person bathing (including washing, rinsing, drying)?: A Little Help from another person to put on and taking off regular upper body clothing?: A Little Help from another person to put on and taking off regular lower body clothing?: A Little 6 Click Score: 18   End of Session Equipment Utilized During Treatment: Gait belt;Rolling walker (2 wheels);Oxygen  Nurse Communication: Mobility status;Other (comment) (Vital signs)  Activity Tolerance: Patient tolerated treatment well;Patient limited by fatigue (Pt limited by feeling of SOB with O2 sat remaining >/93% on 3L throughout session.) Patient left: in bed;with call bell/phone within reach;with bed alarm set;with family/visitor present  OT Visit Diagnosis: Unsteadiness on feet (R26.81);Other symptoms and signs involving cognitive function;Other (comment) (decreased activity tolerance)                Time: 8650-8591 OT Time Calculation (min): 19 min Charges:  OT General Charges $OT Visit: 1 Visit OT Evaluation $OT Eval Low Complexity: 1 Low  [name] HERO., OTR/L, MA Acute Rehab [phone]   [name] 05/31/2024, 2:38 PM

## 2024-05-31 NOTE — Progress Notes (Addendum)
 Pt receives out-pt HD at [facility], MWF, 1025 am chair time. Will continue to assist as needed.    Kayla [name] Dialysis Navigator [phone]  Addendum 3:17 pm D/c orders noted. Contacted out-pt HD clinic [facility] and informed of pt d/c and anticipated arrival back to clinic on Friday. No further support is needed.

## 2024-05-31 NOTE — Progress Notes (Signed)
 Was informed at 0507 that patient had a complaint of chest pain this morning. I entered the room and assessed the patient. Lungs sounds are diminished but clear, vitals were WNL, patient is on telemetry, monitor showed multifocal PVCs, patient stated that the chest pain was mild, did not give a number out of 10, states that the pain is pounding in nature and is located center chest, does not radiate left or right. Patient has no other complaints, no SOB, lightheadedness, arm pain, etc. I paged Dr. Franky at [phone], and informed him about the chest pain, MD ordered troponin labs, CXR, and EKG STAT. I went to the patient to obtain an EKG, however patient is refusing all interventions for the chest pain. Patient states, I'm supposed to go home today, I need to leave before the ice comes in, and you guys will just keep me here longer to do more tests. Patient is concerned that the CXR and EKG will delay her discharge. I explained the rationale for these tests and why we want to determine if there are any cardiac abnormalities and that these exams might not cause a delay in discharge. Patient however is still refusing all interventions. I paged Dr. Franky at [phone] and informed him of patient refusal. MD is aware, will continue to monitor patient.

## 2024-06-01 ENCOUNTER — Other Ambulatory Visit: Payer: Self-pay | Admitting: Internal Medicine

## 2024-06-01 DIAGNOSIS — R0602 Shortness of breath: Secondary | ICD-10-CM

## 2024-06-01 NOTE — Discharge Planning (Signed)
 [facility] Patient Discharge Orders- [facility] CLINIC: [place]  Patient's name: Kayla [name] Admit/DC Dates: 05/29/2024 - 05/31/2024  Discharge Diagnoses: Acute on chronic respiratory failure   HCAP  Aranesp : Given: no   Date and amount of last dose: n/a  Last Hgb: 10.3 PRBC's Given: no Date/# of units: n/a ESA dose for discharge: mircera 100 mcg IV q 2 weeks  IV Iron dose at discharge: none  Heparin  change: no  EDW Change: yes New EDW: 71.8kg  Bath Change: no  Access intervention/Change: no Details:  Hectorol /Calcitriol change: no  Discharge Labs: Calcium  8.9 Phosphorus -- Albumin  -- K+ 4.2  IV Antibiotics: no Details:  On Coumadin?: no Last INR: Next INR: Managed By:   OTHER/APPTS/LAB ORDERS:    D/C Meds to be reconciled by nurse after every discharge.  Completed By: Lucie Collet, PA-C 06/01/2024, 2:36 PM  [facility] Pager: [phone]    Reviewed by: MD:______ RN_______

## 2024-06-01 NOTE — Progress Notes (Signed)
 Patient discharged yesterday.  Home health not set up therefore ordering today, face-to-face completed.

## 2024-06-01 NOTE — Progress Notes (Addendum)
 Patient was discharged home yesterday with family.  I attempted to call the patient and speak with her regarding home health services and I was unable to reach her phone via home phone and cell phone.  Discharge summary does not include orders for home health services.  If patient calls back, I will advise the patient to follow up with her PCP for home health services.  Erminio Herring, [facility] Supervisor is aware.  06/01/2024 1121 - Dr. Caleen placed home health orders.  I called and spoke with the patient's spouse and patient by phone and patient was agreeable to home health services.  She was offered Medicare choice regarding home health and prefers [facility] since she has had them in the past.  Arna Sayres, CM with [facility] accepted the referral and will reach out to the patient to start services this week.  Patient states that she may delay services due to the weather though but [facility] home health will arrange appointment with patient and spouse today.

## 2024-06-06 ENCOUNTER — Telehealth: Payer: Self-pay

## 2024-07-11 ENCOUNTER — Inpatient Hospital Stay: Admitting: Pulmonary Disease
